# Patient Record
Sex: Male | Born: 1940 | Race: White | Hispanic: No | Marital: Married | Smoking: Former smoker
Health system: Southern US, Community
[De-identification: ages and names within clinical notes are randomized; demographics above are authoritative.]

## PROBLEM LIST (undated history)

## (undated) DIAGNOSIS — K219 Gastro-esophageal reflux disease without esophagitis: Secondary | ICD-10-CM

## (undated) DIAGNOSIS — M1990 Unspecified osteoarthritis, unspecified site: Secondary | ICD-10-CM

## (undated) DIAGNOSIS — F32A Depression, unspecified: Secondary | ICD-10-CM

## (undated) DIAGNOSIS — I739 Peripheral vascular disease, unspecified: Secondary | ICD-10-CM

## (undated) DIAGNOSIS — K859 Acute pancreatitis without necrosis or infection, unspecified: Secondary | ICD-10-CM

## (undated) DIAGNOSIS — K589 Irritable bowel syndrome without diarrhea: Secondary | ICD-10-CM

## (undated) DIAGNOSIS — C679 Malignant neoplasm of bladder, unspecified: Secondary | ICD-10-CM

## (undated) DIAGNOSIS — F329 Major depressive disorder, single episode, unspecified: Secondary | ICD-10-CM

## (undated) DIAGNOSIS — T7840XA Allergy, unspecified, initial encounter: Secondary | ICD-10-CM

## (undated) DIAGNOSIS — G4733 Obstructive sleep apnea (adult) (pediatric): Secondary | ICD-10-CM

## (undated) DIAGNOSIS — E785 Hyperlipidemia, unspecified: Secondary | ICD-10-CM

## (undated) DIAGNOSIS — G4730 Sleep apnea, unspecified: Secondary | ICD-10-CM

## (undated) DIAGNOSIS — Z5189 Encounter for other specified aftercare: Secondary | ICD-10-CM

## (undated) DIAGNOSIS — S83249A Other tear of medial meniscus, current injury, unspecified knee, initial encounter: Secondary | ICD-10-CM

## (undated) DIAGNOSIS — C61 Malignant neoplasm of prostate: Secondary | ICD-10-CM

## (undated) DIAGNOSIS — K5790 Diverticulosis of intestine, part unspecified, without perforation or abscess without bleeding: Secondary | ICD-10-CM

## (undated) DIAGNOSIS — R7303 Prediabetes: Secondary | ICD-10-CM

## (undated) DIAGNOSIS — I10 Essential (primary) hypertension: Secondary | ICD-10-CM

## (undated) DIAGNOSIS — Z8739 Personal history of other diseases of the musculoskeletal system and connective tissue: Secondary | ICD-10-CM

## (undated) DIAGNOSIS — I2510 Atherosclerotic heart disease of native coronary artery without angina pectoris: Secondary | ICD-10-CM

## (undated) DIAGNOSIS — D649 Anemia, unspecified: Secondary | ICD-10-CM

## (undated) DIAGNOSIS — E669 Obesity, unspecified: Secondary | ICD-10-CM

## (undated) HISTORY — DX: Essential (primary) hypertension: I10

## (undated) HISTORY — DX: Irritable bowel syndrome, unspecified: K58.9

## (undated) HISTORY — DX: Major depressive disorder, single episode, unspecified: F32.9

## (undated) HISTORY — DX: Obstructive sleep apnea (adult) (pediatric): G47.33

## (undated) HISTORY — PX: SPINE SURGERY: SHX786

## (undated) HISTORY — DX: Acute pancreatitis without necrosis or infection, unspecified: K85.90

## (undated) HISTORY — DX: Anemia, unspecified: D64.9

## (undated) HISTORY — DX: Allergy, unspecified, initial encounter: T78.40XA

## (undated) HISTORY — PX: BACK SURGERY: SHX140

## (undated) HISTORY — DX: Sleep apnea, unspecified: G47.30

## (undated) HISTORY — PX: KNEE ARTHROSCOPY: SHX127

## (undated) HISTORY — DX: Gastro-esophageal reflux disease without esophagitis: K21.9

## (undated) HISTORY — DX: Malignant neoplasm of prostate: C61

## (undated) HISTORY — DX: Atherosclerotic heart disease of native coronary artery without angina pectoris: I25.10

## (undated) HISTORY — PX: HERNIA REPAIR: SHX51

## (undated) HISTORY — DX: Other tear of medial meniscus, current injury, unspecified knee, initial encounter: S83.249A

## (undated) HISTORY — PX: JOINT REPLACEMENT: SHX530

## (undated) HISTORY — DX: Obesity, unspecified: E66.9

## (undated) HISTORY — DX: Depression, unspecified: F32.A

## (undated) HISTORY — DX: Encounter for other specified aftercare: Z51.89

## (undated) HISTORY — DX: Diverticulosis of intestine, part unspecified, without perforation or abscess without bleeding: K57.90

## (undated) HISTORY — DX: Unspecified osteoarthritis, unspecified site: M19.90

## (undated) HISTORY — DX: Hyperlipidemia, unspecified: E78.5

---

## 1978-03-23 HISTORY — PX: THYROIDECTOMY, PARTIAL: SHX18

## 1999-03-24 DIAGNOSIS — Z9289 Personal history of other medical treatment: Secondary | ICD-10-CM | POA: Insufficient documentation

## 1999-11-20 ENCOUNTER — Inpatient Hospital Stay (HOSPITAL_COMMUNITY): Admission: AD | Admit: 1999-11-20 | Discharge: 1999-11-27 | Payer: Self-pay | Admitting: Cardiovascular Disease

## 1999-11-20 ENCOUNTER — Encounter: Payer: Self-pay | Admitting: Cardiovascular Disease

## 1999-11-20 ENCOUNTER — Ambulatory Visit (HOSPITAL_COMMUNITY): Admission: RE | Admit: 1999-11-20 | Discharge: 1999-11-20 | Payer: Self-pay | Admitting: Cardiovascular Disease

## 1999-11-20 HISTORY — PX: CORONARY ARTERY BYPASS GRAFT: SHX141

## 1999-11-20 HISTORY — PX: CARDIAC CATHETERIZATION: SHX172

## 1999-11-21 ENCOUNTER — Encounter: Payer: Self-pay | Admitting: Thoracic Surgery (Cardiothoracic Vascular Surgery)

## 1999-11-22 ENCOUNTER — Encounter: Payer: Self-pay | Admitting: Thoracic Surgery (Cardiothoracic Vascular Surgery)

## 1999-11-23 ENCOUNTER — Encounter: Payer: Self-pay | Admitting: Thoracic Surgery (Cardiothoracic Vascular Surgery)

## 1999-12-09 ENCOUNTER — Encounter (HOSPITAL_COMMUNITY): Admission: RE | Admit: 1999-12-09 | Discharge: 1999-12-30 | Payer: Self-pay | Admitting: Cardiovascular Disease

## 1999-12-31 ENCOUNTER — Encounter (HOSPITAL_COMMUNITY): Admission: RE | Admit: 1999-12-31 | Discharge: 2000-03-30 | Payer: Self-pay | Admitting: Cardiovascular Disease

## 2003-12-01 ENCOUNTER — Ambulatory Visit (HOSPITAL_COMMUNITY): Admission: RE | Admit: 2003-12-01 | Discharge: 2003-12-01 | Payer: Self-pay | Admitting: Family Medicine

## 2004-02-15 ENCOUNTER — Ambulatory Visit: Payer: Self-pay | Admitting: Internal Medicine

## 2004-04-09 ENCOUNTER — Ambulatory Visit: Payer: Self-pay | Admitting: Internal Medicine

## 2004-05-06 ENCOUNTER — Ambulatory Visit: Payer: Self-pay | Admitting: Internal Medicine

## 2004-07-02 ENCOUNTER — Ambulatory Visit: Payer: Self-pay | Admitting: Internal Medicine

## 2004-07-29 ENCOUNTER — Ambulatory Visit: Payer: Self-pay | Admitting: Cardiology

## 2004-07-30 ENCOUNTER — Ambulatory Visit: Payer: Self-pay | Admitting: Internal Medicine

## 2004-08-05 ENCOUNTER — Ambulatory Visit: Payer: Self-pay

## 2004-08-05 ENCOUNTER — Encounter: Payer: Self-pay | Admitting: Internal Medicine

## 2004-10-08 ENCOUNTER — Ambulatory Visit: Payer: Self-pay | Admitting: Internal Medicine

## 2004-10-31 ENCOUNTER — Ambulatory Visit: Payer: Self-pay | Admitting: Internal Medicine

## 2004-12-11 ENCOUNTER — Ambulatory Visit: Payer: Self-pay | Admitting: Cardiology

## 2004-12-12 ENCOUNTER — Ambulatory Visit: Payer: Self-pay | Admitting: Internal Medicine

## 2005-01-01 ENCOUNTER — Ambulatory Visit: Payer: Self-pay | Admitting: Internal Medicine

## 2005-03-04 ENCOUNTER — Ambulatory Visit: Payer: Self-pay | Admitting: Internal Medicine

## 2005-03-23 HISTORY — PX: NASAL SEPTUM SURGERY: SHX37

## 2005-03-23 HISTORY — PX: LAPAROSCOPIC CHOLECYSTECTOMY: SUR755

## 2005-05-09 ENCOUNTER — Ambulatory Visit: Payer: Self-pay | Admitting: Family Medicine

## 2005-05-27 ENCOUNTER — Ambulatory Visit: Payer: Self-pay | Admitting: Internal Medicine

## 2005-08-05 ENCOUNTER — Ambulatory Visit: Payer: Self-pay | Admitting: Internal Medicine

## 2005-10-08 ENCOUNTER — Ambulatory Visit: Payer: Self-pay | Admitting: Internal Medicine

## 2005-11-19 ENCOUNTER — Encounter (INDEPENDENT_AMBULATORY_CARE_PROVIDER_SITE_OTHER): Payer: Self-pay | Admitting: *Deleted

## 2005-11-19 ENCOUNTER — Ambulatory Visit (HOSPITAL_COMMUNITY): Admission: RE | Admit: 2005-11-19 | Discharge: 2005-11-19 | Payer: Self-pay | Admitting: Otolaryngology

## 2005-11-24 ENCOUNTER — Encounter: Admission: RE | Admit: 2005-11-24 | Discharge: 2005-11-24 | Payer: Self-pay | Admitting: Internal Medicine

## 2005-11-26 ENCOUNTER — Ambulatory Visit: Payer: Self-pay | Admitting: Internal Medicine

## 2005-11-26 ENCOUNTER — Encounter: Payer: Self-pay | Admitting: Internal Medicine

## 2005-12-17 ENCOUNTER — Ambulatory Visit: Payer: Self-pay | Admitting: Internal Medicine

## 2006-01-20 ENCOUNTER — Ambulatory Visit: Payer: Self-pay | Admitting: Internal Medicine

## 2006-02-10 ENCOUNTER — Ambulatory Visit: Payer: Self-pay | Admitting: Cardiology

## 2006-04-22 ENCOUNTER — Ambulatory Visit: Payer: Self-pay | Admitting: Internal Medicine

## 2006-04-24 ENCOUNTER — Encounter: Admission: RE | Admit: 2006-04-24 | Discharge: 2006-04-24 | Payer: Self-pay | Admitting: Internal Medicine

## 2006-04-27 ENCOUNTER — Emergency Department (HOSPITAL_COMMUNITY): Admission: EM | Admit: 2006-04-27 | Discharge: 2006-04-27 | Payer: Self-pay | Admitting: Emergency Medicine

## 2006-04-28 ENCOUNTER — Ambulatory Visit: Payer: Self-pay | Admitting: Internal Medicine

## 2006-04-29 ENCOUNTER — Ambulatory Visit: Payer: Self-pay | Admitting: Gastroenterology

## 2006-05-26 ENCOUNTER — Inpatient Hospital Stay (HOSPITAL_COMMUNITY): Admission: RE | Admit: 2006-05-26 | Discharge: 2006-05-28 | Payer: Self-pay | Admitting: Neurosurgery

## 2006-05-26 HISTORY — PX: ANTERIOR CERVICAL DECOMP/DISCECTOMY FUSION: SHX1161

## 2006-06-15 ENCOUNTER — Ambulatory Visit: Payer: Self-pay | Admitting: Internal Medicine

## 2006-07-29 ENCOUNTER — Encounter (INDEPENDENT_AMBULATORY_CARE_PROVIDER_SITE_OTHER): Payer: Self-pay | Admitting: Specialist

## 2006-07-29 ENCOUNTER — Ambulatory Visit (HOSPITAL_COMMUNITY): Admission: RE | Admit: 2006-07-29 | Discharge: 2006-07-29 | Payer: Self-pay | Admitting: Surgery

## 2006-09-23 ENCOUNTER — Ambulatory Visit: Payer: Self-pay | Admitting: Internal Medicine

## 2006-09-29 ENCOUNTER — Ambulatory Visit: Payer: Self-pay | Admitting: Internal Medicine

## 2006-10-12 ENCOUNTER — Telehealth: Payer: Self-pay | Admitting: *Deleted

## 2006-10-12 DIAGNOSIS — K589 Irritable bowel syndrome without diarrhea: Secondary | ICD-10-CM | POA: Insufficient documentation

## 2006-10-12 DIAGNOSIS — F324 Major depressive disorder, single episode, in partial remission: Secondary | ICD-10-CM | POA: Insufficient documentation

## 2006-10-12 DIAGNOSIS — E785 Hyperlipidemia, unspecified: Secondary | ICD-10-CM | POA: Insufficient documentation

## 2006-10-12 DIAGNOSIS — I2510 Atherosclerotic heart disease of native coronary artery without angina pectoris: Secondary | ICD-10-CM | POA: Insufficient documentation

## 2006-10-12 DIAGNOSIS — J45909 Unspecified asthma, uncomplicated: Secondary | ICD-10-CM | POA: Insufficient documentation

## 2006-10-12 DIAGNOSIS — K219 Gastro-esophageal reflux disease without esophagitis: Secondary | ICD-10-CM | POA: Insufficient documentation

## 2006-10-12 DIAGNOSIS — F325 Major depressive disorder, single episode, in full remission: Secondary | ICD-10-CM | POA: Insufficient documentation

## 2006-10-13 ENCOUNTER — Ambulatory Visit: Payer: Self-pay | Admitting: Internal Medicine

## 2006-10-25 ENCOUNTER — Telehealth: Payer: Self-pay | Admitting: Internal Medicine

## 2006-11-02 ENCOUNTER — Ambulatory Visit: Payer: Self-pay | Admitting: Internal Medicine

## 2006-11-02 DIAGNOSIS — G4733 Obstructive sleep apnea (adult) (pediatric): Secondary | ICD-10-CM | POA: Insufficient documentation

## 2006-11-02 DIAGNOSIS — M1990 Unspecified osteoarthritis, unspecified site: Secondary | ICD-10-CM | POA: Insufficient documentation

## 2006-11-04 ENCOUNTER — Telehealth (INDEPENDENT_AMBULATORY_CARE_PROVIDER_SITE_OTHER): Payer: Self-pay | Admitting: *Deleted

## 2006-11-11 ENCOUNTER — Telehealth (INDEPENDENT_AMBULATORY_CARE_PROVIDER_SITE_OTHER): Payer: Self-pay | Admitting: *Deleted

## 2006-11-11 ENCOUNTER — Telehealth: Payer: Self-pay | Admitting: *Deleted

## 2006-12-14 ENCOUNTER — Ambulatory Visit: Payer: Self-pay | Admitting: Internal Medicine

## 2006-12-14 DIAGNOSIS — R413 Other amnesia: Secondary | ICD-10-CM | POA: Insufficient documentation

## 2006-12-15 ENCOUNTER — Telehealth (INDEPENDENT_AMBULATORY_CARE_PROVIDER_SITE_OTHER): Payer: Self-pay | Admitting: *Deleted

## 2007-02-08 ENCOUNTER — Telehealth: Payer: Self-pay | Admitting: *Deleted

## 2007-02-21 ENCOUNTER — Ambulatory Visit: Payer: Self-pay | Admitting: Internal Medicine

## 2007-02-21 DIAGNOSIS — I10 Essential (primary) hypertension: Secondary | ICD-10-CM | POA: Insufficient documentation

## 2007-02-28 ENCOUNTER — Telehealth (INDEPENDENT_AMBULATORY_CARE_PROVIDER_SITE_OTHER): Payer: Self-pay | Admitting: *Deleted

## 2007-03-23 ENCOUNTER — Telehealth: Payer: Self-pay | Admitting: *Deleted

## 2007-03-26 ENCOUNTER — Ambulatory Visit: Payer: Self-pay | Admitting: Family Medicine

## 2007-03-28 ENCOUNTER — Telehealth: Payer: Self-pay | Admitting: Internal Medicine

## 2007-04-28 ENCOUNTER — Encounter: Payer: Self-pay | Admitting: Internal Medicine

## 2007-04-28 LAB — CONVERTED CEMR LAB
ALT: 27 units/L
AST: 24 units/L
Alkaline Phosphatase: 72 units/L
BUN: 18 mg/dL
Bilirubin, Direct: 0.2 mg/dL
CO2: 23 meq/L
Cholesterol: 144 mg/dL
Creatinine, Ser: 1.44 mg/dL
HDL: 43 mg/dL
LDL Cholesterol: 74 mg/dL
Potassium: 4.5 meq/L
Sodium: 140 meq/L
Total Bilirubin: 0.8 mg/dL
Total Protein: 7 g/dL
Triglycerides: 137 mg/dL

## 2007-05-05 ENCOUNTER — Ambulatory Visit: Payer: Self-pay | Admitting: Internal Medicine

## 2007-05-05 DIAGNOSIS — H9319 Tinnitus, unspecified ear: Secondary | ICD-10-CM | POA: Insufficient documentation

## 2007-05-05 DIAGNOSIS — IMO0002 Reserved for concepts with insufficient information to code with codable children: Secondary | ICD-10-CM | POA: Insufficient documentation

## 2007-05-13 ENCOUNTER — Telehealth: Payer: Self-pay | Admitting: Internal Medicine

## 2007-05-16 ENCOUNTER — Encounter: Payer: Self-pay | Admitting: Internal Medicine

## 2007-06-18 ENCOUNTER — Encounter: Payer: Self-pay | Admitting: Internal Medicine

## 2007-06-21 ENCOUNTER — Telehealth (INDEPENDENT_AMBULATORY_CARE_PROVIDER_SITE_OTHER): Payer: Self-pay | Admitting: *Deleted

## 2007-06-28 ENCOUNTER — Ambulatory Visit: Payer: Self-pay | Admitting: Internal Medicine

## 2007-08-02 ENCOUNTER — Telehealth: Payer: Self-pay | Admitting: Internal Medicine

## 2007-08-11 ENCOUNTER — Telehealth: Payer: Self-pay | Admitting: Internal Medicine

## 2007-08-19 ENCOUNTER — Telehealth: Payer: Self-pay | Admitting: Internal Medicine

## 2007-08-25 ENCOUNTER — Ambulatory Visit: Payer: Self-pay | Admitting: Internal Medicine

## 2007-08-25 DIAGNOSIS — M7700 Medial epicondylitis, unspecified elbow: Secondary | ICD-10-CM | POA: Insufficient documentation

## 2007-09-19 ENCOUNTER — Telehealth: Payer: Self-pay | Admitting: Internal Medicine

## 2007-10-24 ENCOUNTER — Telehealth: Payer: Self-pay | Admitting: Internal Medicine

## 2007-12-08 ENCOUNTER — Telehealth: Payer: Self-pay | Admitting: Internal Medicine

## 2007-12-08 ENCOUNTER — Ambulatory Visit: Payer: Self-pay | Admitting: Internal Medicine

## 2007-12-29 ENCOUNTER — Telehealth: Payer: Self-pay | Admitting: Internal Medicine

## 2007-12-29 ENCOUNTER — Encounter (INDEPENDENT_AMBULATORY_CARE_PROVIDER_SITE_OTHER): Payer: Self-pay | Admitting: *Deleted

## 2008-01-05 ENCOUNTER — Ambulatory Visit: Payer: Self-pay | Admitting: Internal Medicine

## 2008-01-10 ENCOUNTER — Telehealth: Payer: Self-pay | Admitting: Internal Medicine

## 2008-01-27 ENCOUNTER — Telehealth (INDEPENDENT_AMBULATORY_CARE_PROVIDER_SITE_OTHER): Payer: Self-pay | Admitting: *Deleted

## 2008-01-31 ENCOUNTER — Ambulatory Visit: Payer: Self-pay | Admitting: Cardiology

## 2008-02-01 ENCOUNTER — Encounter: Payer: Self-pay | Admitting: Internal Medicine

## 2008-02-01 ENCOUNTER — Ambulatory Visit (HOSPITAL_BASED_OUTPATIENT_CLINIC_OR_DEPARTMENT_OTHER): Admission: RE | Admit: 2008-02-01 | Discharge: 2008-02-01 | Payer: Self-pay | Admitting: Neurosurgery

## 2008-02-14 ENCOUNTER — Ambulatory Visit: Payer: Self-pay | Admitting: Pulmonary Disease

## 2008-02-15 ENCOUNTER — Encounter: Payer: Self-pay | Admitting: Internal Medicine

## 2008-02-23 ENCOUNTER — Ambulatory Visit: Payer: Self-pay | Admitting: Internal Medicine

## 2008-03-14 ENCOUNTER — Telehealth: Payer: Self-pay | Admitting: Internal Medicine

## 2008-03-20 ENCOUNTER — Encounter: Payer: Self-pay | Admitting: Internal Medicine

## 2008-04-05 ENCOUNTER — Ambulatory Visit: Payer: Self-pay | Admitting: Internal Medicine

## 2008-04-09 ENCOUNTER — Encounter: Payer: Self-pay | Admitting: Internal Medicine

## 2008-05-09 ENCOUNTER — Encounter: Payer: Self-pay | Admitting: Internal Medicine

## 2008-05-25 ENCOUNTER — Encounter: Payer: Self-pay | Admitting: Internal Medicine

## 2008-06-08 ENCOUNTER — Ambulatory Visit: Payer: Self-pay | Admitting: Internal Medicine

## 2008-07-04 ENCOUNTER — Encounter: Payer: Self-pay | Admitting: Internal Medicine

## 2008-08-08 ENCOUNTER — Encounter: Payer: Self-pay | Admitting: Internal Medicine

## 2008-09-03 ENCOUNTER — Telehealth: Payer: Self-pay | Admitting: Internal Medicine

## 2008-09-05 ENCOUNTER — Telehealth: Payer: Self-pay | Admitting: Internal Medicine

## 2008-09-06 ENCOUNTER — Encounter: Payer: Self-pay | Admitting: Internal Medicine

## 2008-09-14 ENCOUNTER — Ambulatory Visit: Payer: Self-pay | Admitting: Internal Medicine

## 2008-09-14 DIAGNOSIS — R1013 Epigastric pain: Secondary | ICD-10-CM | POA: Insufficient documentation

## 2008-09-18 ENCOUNTER — Ambulatory Visit: Payer: Self-pay | Admitting: Internal Medicine

## 2008-09-20 ENCOUNTER — Telehealth: Payer: Self-pay | Admitting: Internal Medicine

## 2008-09-28 ENCOUNTER — Telehealth: Payer: Self-pay | Admitting: *Deleted

## 2008-10-10 ENCOUNTER — Telehealth: Payer: Self-pay | Admitting: Internal Medicine

## 2008-10-10 ENCOUNTER — Ambulatory Visit: Payer: Self-pay | Admitting: Internal Medicine

## 2008-10-10 DIAGNOSIS — M79609 Pain in unspecified limb: Secondary | ICD-10-CM | POA: Insufficient documentation

## 2008-10-12 ENCOUNTER — Ambulatory Visit: Payer: Self-pay | Admitting: Internal Medicine

## 2008-10-12 LAB — CONVERTED CEMR LAB
BUN: 13 mg/dL (ref 6–23)
CO2: 31 meq/L (ref 19–32)
Calcium: 9.2 mg/dL (ref 8.4–10.5)
Chloride: 102 meq/L (ref 96–112)
Cholesterol: 118 mg/dL (ref 0–200)
Creatinine, Ser: 1.5 mg/dL (ref 0.4–1.5)
GFR calc non Af Amer: 49.82 mL/min (ref >60)
Glucose, Bld: 96 mg/dL (ref 70–99)
HDL: 39.8 mg/dL (ref >39.00)
LDL Cholesterol: 56 mg/dL (ref 0–99)
Potassium: 4.2 meq/L (ref 3.5–5.1)
Sodium: 137 meq/L (ref 135–145)
Total CHOL/HDL Ratio: 3
Triglycerides: 113 mg/dL (ref 0.0–149.0)
VLDL: 22.6 mg/dL (ref 0.0–40.0)

## 2008-10-18 ENCOUNTER — Ambulatory Visit: Payer: Self-pay | Admitting: Internal Medicine

## 2008-10-29 ENCOUNTER — Telehealth (INDEPENDENT_AMBULATORY_CARE_PROVIDER_SITE_OTHER): Payer: Self-pay | Admitting: *Deleted

## 2008-11-06 ENCOUNTER — Ambulatory Visit: Payer: Self-pay | Admitting: Gastroenterology

## 2008-11-06 DIAGNOSIS — D509 Iron deficiency anemia, unspecified: Secondary | ICD-10-CM | POA: Insufficient documentation

## 2008-11-06 LAB — CONVERTED CEMR LAB
ALT: 22 units/L (ref 0–53)
AST: 23 units/L (ref 0–37)
Albumin: 3.6 g/dL (ref 3.5–5.2)
Alkaline Phosphatase: 65 units/L (ref 39–117)
BUN: 19 mg/dL (ref 6–23)
Basophils Relative: 0.5 % (ref 0.0–3.0)
Bilirubin, Direct: 0.2 mg/dL (ref 0.0–0.3)
CO2: 31 meq/L (ref 19–32)
Calcium: 9 mg/dL (ref 8.4–10.5)
Chloride: 103 meq/L (ref 96–112)
Creatinine, Ser: 1.4 mg/dL (ref 0.4–1.5)
Eosinophils Relative: 5.1 % — ABNORMAL HIGH (ref 0.0–5.0)
Ferritin: 27.8 ng/mL (ref 22.0–322.0)
Folate: >20 ng/mL
GFR calc non Af Amer: 53.93 mL/min (ref >60)
Glucose, Bld: 104 mg/dL — ABNORMAL HIGH (ref 70–99)
HCT: 39.6 % (ref 39.0–52.0)
Hemoglobin: 13.8 g/dL (ref 13.0–17.0)
Iron: 54 ug/dL (ref 42–165)
Lymphocytes Relative: 10.2 % — ABNORMAL LOW (ref 12.0–46.0)
MCHC: 34.8 g/dL (ref 30.0–36.0)
MCV: 88.4 fL (ref 78.0–100.0)
Monocytes Relative: 9.3 % (ref 3.0–12.0)
Neutrophils Relative %: 74.9 % (ref 43.0–77.0)
Platelets: 206 10*3/uL (ref 150.0–400.0)
Potassium: 4.3 meq/L (ref 3.5–5.1)
RBC: 4.48 M/uL (ref 4.22–5.81)
RDW: 12 % (ref 11.5–14.6)
Saturation Ratios: 15.7 % — ABNORMAL LOW (ref 20.0–50.0)
Sodium: 137 meq/L (ref 135–145)
TSH: 1.14 microintl units/mL (ref 0.35–5.50)
Total Bilirubin: 0.9 mg/dL (ref 0.3–1.2)
Total Protein: 6.5 g/dL (ref 6.0–8.3)
Transferrin: 246.1 mg/dL (ref 212.0–360.0)
Vitamin B-12: >1500 pg/mL — ABNORMAL HIGH (ref 211–911)
WBC: 9.4 10*3/uL (ref 4.5–10.5)

## 2008-11-07 ENCOUNTER — Encounter: Payer: Self-pay | Admitting: Gastroenterology

## 2008-11-07 LAB — CONVERTED CEMR LAB: Tissue Transglutaminase Ab, IgA: 0.2 units (ref <7)

## 2008-11-14 ENCOUNTER — Encounter: Payer: Self-pay | Admitting: Internal Medicine

## 2008-11-20 ENCOUNTER — Telehealth: Payer: Self-pay | Admitting: Internal Medicine

## 2008-11-27 ENCOUNTER — Ambulatory Visit: Payer: Self-pay | Admitting: Gastroenterology

## 2008-12-05 ENCOUNTER — Telehealth: Payer: Self-pay | Admitting: Internal Medicine

## 2008-12-12 ENCOUNTER — Encounter: Payer: Self-pay | Admitting: Gastroenterology

## 2008-12-12 ENCOUNTER — Ambulatory Visit: Payer: Self-pay | Admitting: Gastroenterology

## 2008-12-12 LAB — HM COLONOSCOPY

## 2008-12-13 ENCOUNTER — Telehealth: Payer: Self-pay | Admitting: Internal Medicine

## 2008-12-13 ENCOUNTER — Encounter: Payer: Self-pay | Admitting: Gastroenterology

## 2008-12-18 ENCOUNTER — Telehealth: Payer: Self-pay | Admitting: Gastroenterology

## 2008-12-19 ENCOUNTER — Telehealth: Payer: Self-pay | Admitting: Internal Medicine

## 2008-12-26 ENCOUNTER — Ambulatory Visit: Payer: Self-pay | Admitting: Internal Medicine

## 2008-12-26 DIAGNOSIS — M25559 Pain in unspecified hip: Secondary | ICD-10-CM | POA: Insufficient documentation

## 2008-12-26 DIAGNOSIS — M169 Osteoarthritis of hip, unspecified: Secondary | ICD-10-CM | POA: Insufficient documentation

## 2008-12-26 DIAGNOSIS — M1610 Unilateral primary osteoarthritis, unspecified hip: Secondary | ICD-10-CM | POA: Insufficient documentation

## 2008-12-26 DIAGNOSIS — Z87891 Personal history of nicotine dependence: Secondary | ICD-10-CM | POA: Insufficient documentation

## 2009-01-01 ENCOUNTER — Telehealth: Payer: Self-pay | Admitting: Internal Medicine

## 2009-01-30 ENCOUNTER — Telehealth: Payer: Self-pay | Admitting: Gastroenterology

## 2009-01-31 ENCOUNTER — Ambulatory Visit: Payer: Self-pay | Admitting: Gastroenterology

## 2009-01-31 ENCOUNTER — Telehealth: Payer: Self-pay | Admitting: Nurse Practitioner

## 2009-01-31 DIAGNOSIS — R1084 Generalized abdominal pain: Secondary | ICD-10-CM | POA: Insufficient documentation

## 2009-01-31 DIAGNOSIS — K5900 Constipation, unspecified: Secondary | ICD-10-CM | POA: Insufficient documentation

## 2009-02-01 LAB — CONVERTED CEMR LAB
ALT: 25 units/L (ref 0–53)
AST: 22 units/L (ref 0–37)
Albumin: 3.9 g/dL (ref 3.5–5.2)
Alkaline Phosphatase: 64 units/L (ref 39–117)
Amylase: 51 units/L (ref 27–131)
BUN: 18 mg/dL (ref 6–23)
Basophils Absolute: 0.1 10*3/uL (ref 0.0–0.1)
Basophils Relative: 0.6 % (ref 0.0–3.0)
CO2: 30 meq/L (ref 19–32)
CRP, High Sensitivity: 12.5 — ABNORMAL HIGH (ref 0.00–5.00)
Calcium: 9.4 mg/dL (ref 8.4–10.5)
Chloride: 98 meq/L (ref 96–112)
Creatinine, Ser: 1.5 mg/dL (ref 0.4–1.5)
Eosinophils Absolute: 0.4 10*3/uL (ref 0.0–0.7)
Eosinophils Relative: 3.9 % (ref 0.0–5.0)
GFR calc non Af Amer: 49.77 mL/min (ref >60)
Glucose, Bld: 93 mg/dL (ref 70–99)
HCT: 41.3 % (ref 39.0–52.0)
Hemoglobin: 14.3 g/dL (ref 13.0–17.0)
Lipase: 27 units/L (ref 11.0–59.0)
Lymphocytes Relative: 15 % (ref 12.0–46.0)
Lymphs Abs: 1.4 10*3/uL (ref 0.7–4.0)
MCHC: 34.5 g/dL (ref 30.0–36.0)
MCV: 90.6 fL (ref 78.0–100.0)
Monocytes Absolute: 0.9 10*3/uL (ref 0.1–1.0)
Monocytes Relative: 9.5 % (ref 3.0–12.0)
Neutro Abs: 6.4 10*3/uL (ref 1.4–7.7)
Neutrophils Relative %: 71 % (ref 43.0–77.0)
Platelets: 201 10*3/uL (ref 150.0–400.0)
Potassium: 4.5 meq/L (ref 3.5–5.1)
RBC: 4.56 M/uL (ref 4.22–5.81)
RDW: 12.6 % (ref 11.5–14.6)
Sed Rate: 23 mm/hr — ABNORMAL HIGH (ref 0–22)
Sodium: 135 meq/L (ref 135–145)
Total Bilirubin: 1 mg/dL (ref 0.3–1.2)
Total Protein: 7.1 g/dL (ref 6.0–8.3)
WBC: 9.2 10*3/uL (ref 4.5–10.5)

## 2009-02-28 ENCOUNTER — Telehealth: Payer: Self-pay | Admitting: Gastroenterology

## 2009-02-28 ENCOUNTER — Ambulatory Visit: Payer: Self-pay | Admitting: Gastroenterology

## 2009-03-28 ENCOUNTER — Ambulatory Visit: Payer: Self-pay | Admitting: Internal Medicine

## 2009-04-09 ENCOUNTER — Telehealth: Payer: Self-pay | Admitting: Internal Medicine

## 2009-04-12 ENCOUNTER — Telehealth: Payer: Self-pay | Admitting: Internal Medicine

## 2009-04-12 ENCOUNTER — Ambulatory Visit: Payer: Self-pay | Admitting: Internal Medicine

## 2009-04-12 DIAGNOSIS — R0989 Other specified symptoms and signs involving the circulatory and respiratory systems: Secondary | ICD-10-CM | POA: Insufficient documentation

## 2009-04-15 ENCOUNTER — Telehealth: Payer: Self-pay | Admitting: Internal Medicine

## 2009-04-19 ENCOUNTER — Telehealth: Payer: Self-pay | Admitting: Internal Medicine

## 2009-04-22 ENCOUNTER — Ambulatory Visit: Payer: Self-pay | Admitting: Internal Medicine

## 2009-04-22 DIAGNOSIS — J45901 Unspecified asthma with (acute) exacerbation: Secondary | ICD-10-CM | POA: Insufficient documentation

## 2009-04-26 ENCOUNTER — Telehealth: Payer: Self-pay | Admitting: Internal Medicine

## 2009-05-01 ENCOUNTER — Ambulatory Visit: Payer: Self-pay | Admitting: Internal Medicine

## 2009-05-01 DIAGNOSIS — B370 Candidal stomatitis: Secondary | ICD-10-CM | POA: Insufficient documentation

## 2009-05-01 DIAGNOSIS — R0602 Shortness of breath: Secondary | ICD-10-CM | POA: Insufficient documentation

## 2009-05-01 DIAGNOSIS — R079 Chest pain, unspecified: Secondary | ICD-10-CM | POA: Insufficient documentation

## 2009-05-09 ENCOUNTER — Encounter: Payer: Self-pay | Admitting: Physician Assistant

## 2009-05-09 ENCOUNTER — Ambulatory Visit: Payer: Self-pay | Admitting: Cardiology

## 2009-05-13 ENCOUNTER — Telehealth (INDEPENDENT_AMBULATORY_CARE_PROVIDER_SITE_OTHER): Payer: Self-pay | Admitting: *Deleted

## 2009-05-14 ENCOUNTER — Ambulatory Visit: Payer: Self-pay | Admitting: Cardiology

## 2009-05-14 ENCOUNTER — Ambulatory Visit: Payer: Self-pay

## 2009-05-14 ENCOUNTER — Encounter (HOSPITAL_COMMUNITY): Admission: RE | Admit: 2009-05-14 | Discharge: 2009-07-23 | Payer: Self-pay | Admitting: Cardiology

## 2009-06-04 ENCOUNTER — Encounter: Payer: Self-pay | Admitting: Cardiology

## 2009-06-06 ENCOUNTER — Ambulatory Visit: Payer: Self-pay | Admitting: Internal Medicine

## 2009-06-10 ENCOUNTER — Ambulatory Visit: Payer: Self-pay | Admitting: Cardiology

## 2009-06-10 ENCOUNTER — Telehealth: Payer: Self-pay | Admitting: Internal Medicine

## 2009-06-24 ENCOUNTER — Telehealth: Payer: Self-pay | Admitting: Internal Medicine

## 2009-07-04 ENCOUNTER — Telehealth: Payer: Self-pay | Admitting: Internal Medicine

## 2009-07-04 ENCOUNTER — Ambulatory Visit: Payer: Self-pay | Admitting: Internal Medicine

## 2009-07-04 LAB — CONVERTED CEMR LAB
BUN: 20 mg/dL (ref 6–23)
Basophils Absolute: 0 10*3/uL (ref 0.0–0.1)
Basophils Relative: 0.5 % (ref 0.0–3.0)
CO2: 29 meq/L (ref 19–32)
Calcium: 9.7 mg/dL (ref 8.4–10.5)
Chloride: 101 meq/L (ref 96–112)
Cholesterol: 152 mg/dL (ref 0–200)
Creatinine, Ser: 1.4 mg/dL (ref 0.4–1.5)
Direct LDL: 81.1 mg/dL
Eosinophils Absolute: 0.3 10*3/uL (ref 0.0–0.7)
Eosinophils Relative: 3.6 % (ref 0.0–5.0)
GFR calc non Af Amer: 53.83 mL/min (ref >60)
Glucose, Bld: 97 mg/dL (ref 70–99)
HCT: 43.1 % (ref 39.0–52.0)
HDL: 44.2 mg/dL (ref >39.00)
Hemoglobin: 15.1 g/dL (ref 13.0–17.0)
Lymphocytes Relative: 15.9 % (ref 12.0–46.0)
Lymphs Abs: 1.2 10*3/uL (ref 0.7–4.0)
MCHC: 35 g/dL (ref 30.0–36.0)
MCV: 90.4 fL (ref 78.0–100.0)
Monocytes Absolute: 0.8 10*3/uL (ref 0.1–1.0)
Monocytes Relative: 10.5 % (ref 3.0–12.0)
Neutro Abs: 5.2 10*3/uL (ref 1.4–7.7)
Neutrophils Relative %: 69.5 % (ref 43.0–77.0)
Platelets: 244 10*3/uL (ref 150.0–400.0)
Potassium: 4.5 meq/L (ref 3.5–5.1)
RBC: 4.77 M/uL (ref 4.22–5.81)
RDW: 12.7 % (ref 11.5–14.6)
Sodium: 140 meq/L (ref 135–145)
WBC: 7.4 10*3/uL (ref 4.5–10.5)

## 2009-07-09 ENCOUNTER — Ambulatory Visit: Payer: Self-pay | Admitting: Internal Medicine

## 2009-07-09 LAB — CONVERTED CEMR LAB
BUN: 18 mg/dL (ref 6–23)
Basophils Absolute: 0 10*3/uL (ref 0.0–0.1)
Basophils Relative: 0.4 % (ref 0.0–3.0)
CO2: 30 meq/L (ref 19–32)
Calcium: 8.9 mg/dL (ref 8.4–10.5)
Chloride: 101 meq/L (ref 96–112)
Creatinine, Ser: 1.8 mg/dL — ABNORMAL HIGH (ref 0.4–1.5)
Eosinophils Absolute: 0.1 10*3/uL (ref 0.0–0.7)
Eosinophils Relative: 0.9 % (ref 0.0–5.0)
GFR calc non Af Amer: 40.27 mL/min (ref >60)
Glucose, Bld: 105 mg/dL — ABNORMAL HIGH (ref 70–99)
HCT: 45.8 % (ref 39.0–52.0)
Hemoglobin: 15.9 g/dL (ref 13.0–17.0)
Lymphocytes Relative: 11.1 % — ABNORMAL LOW (ref 12.0–46.0)
Lymphs Abs: 0.7 10*3/uL (ref 0.7–4.0)
MCHC: 34.8 g/dL (ref 30.0–36.0)
MCV: 90.3 fL (ref 78.0–100.0)
Monocytes Absolute: 0.6 10*3/uL (ref 0.1–1.0)
Monocytes Relative: 9.7 % (ref 3.0–12.0)
Neutro Abs: 5.2 10*3/uL (ref 1.4–7.7)
Neutrophils Relative %: 77.9 % — ABNORMAL HIGH (ref 43.0–77.0)
Platelets: 214 10*3/uL (ref 150.0–400.0)
Potassium: 3.8 meq/L (ref 3.5–5.1)
RBC: 5.07 M/uL (ref 4.22–5.81)
RDW: 12.5 % (ref 11.5–14.6)
Sodium: 139 meq/L (ref 135–145)
WBC: 6.7 10*3/uL (ref 4.5–10.5)

## 2009-07-15 ENCOUNTER — Ambulatory Visit: Payer: Self-pay | Admitting: Internal Medicine

## 2009-07-15 ENCOUNTER — Telehealth: Payer: Self-pay | Admitting: Internal Medicine

## 2009-07-26 ENCOUNTER — Telehealth: Payer: Self-pay | Admitting: Internal Medicine

## 2009-08-06 ENCOUNTER — Ambulatory Visit: Payer: Self-pay | Admitting: Internal Medicine

## 2009-08-16 ENCOUNTER — Ambulatory Visit: Payer: Self-pay | Admitting: Family Medicine

## 2009-08-28 ENCOUNTER — Ambulatory Visit: Payer: Self-pay | Admitting: Internal Medicine

## 2009-09-25 ENCOUNTER — Encounter: Payer: Self-pay | Admitting: Internal Medicine

## 2009-10-04 ENCOUNTER — Ambulatory Visit: Payer: Self-pay | Admitting: Internal Medicine

## 2009-10-04 DIAGNOSIS — M1710 Unilateral primary osteoarthritis, unspecified knee: Secondary | ICD-10-CM | POA: Insufficient documentation

## 2009-12-04 ENCOUNTER — Ambulatory Visit: Payer: Self-pay | Admitting: Family Medicine

## 2009-12-04 ENCOUNTER — Telehealth: Payer: Self-pay | Admitting: Gastroenterology

## 2009-12-05 ENCOUNTER — Encounter: Payer: Self-pay | Admitting: Family Medicine

## 2009-12-06 ENCOUNTER — Ambulatory Visit: Payer: Self-pay

## 2009-12-06 ENCOUNTER — Encounter: Payer: Self-pay | Admitting: Family Medicine

## 2010-01-06 ENCOUNTER — Ambulatory Visit: Payer: Self-pay | Admitting: Internal Medicine

## 2010-01-06 DIAGNOSIS — J0110 Acute frontal sinusitis, unspecified: Secondary | ICD-10-CM | POA: Insufficient documentation

## 2010-01-07 ENCOUNTER — Telehealth: Payer: Self-pay | Admitting: Internal Medicine

## 2010-02-18 ENCOUNTER — Ambulatory Visit: Payer: Self-pay | Admitting: Gastroenterology

## 2010-02-18 LAB — CONVERTED CEMR LAB
ALT: 26 units/L (ref 0–53)
AST: 28 units/L (ref 0–37)
Albumin: 4.1 g/dL (ref 3.5–5.2)
Alkaline Phosphatase: 60 units/L (ref 39–117)
Amylase: 31 units/L (ref 27–131)
BUN: 20 mg/dL (ref 6–23)
Basophils Absolute: 0.1 10*3/uL (ref 0.0–0.1)
Basophils Relative: 0.7 % (ref 0.0–3.0)
Bilirubin, Direct: 0.1 mg/dL (ref 0.0–0.3)
CO2: 28 meq/L (ref 19–32)
Calcium: 9.3 mg/dL (ref 8.4–10.5)
Chloride: 100 meq/L (ref 96–112)
Creatinine, Ser: 1.5 mg/dL (ref 0.4–1.5)
Eosinophils Absolute: 0.3 10*3/uL (ref 0.0–0.7)
Eosinophils Relative: 4.6 % (ref 0.0–5.0)
Ferritin: 39.7 ng/mL (ref 22.0–322.0)
Folate: >20 ng/mL
GFR calc non Af Amer: 50 mL/min (ref >60)
Glucose, Bld: 93 mg/dL (ref 70–99)
HCT: 42.8 % (ref 39.0–52.0)
Hemoglobin: 14.9 g/dL (ref 13.0–17.0)
IgA: 118 mg/dL (ref 68–378)
Iron: 107 ug/dL (ref 42–165)
Lipase: 34 units/L (ref 11.0–59.0)
Lymphocytes Relative: 18.7 % (ref 12.0–46.0)
Lymphs Abs: 1.3 10*3/uL (ref 0.7–4.0)
MCHC: 34.9 g/dL (ref 30.0–36.0)
MCV: 91.6 fL (ref 78.0–100.0)
Monocytes Absolute: 0.9 10*3/uL (ref 0.1–1.0)
Monocytes Relative: 12.2 % — ABNORMAL HIGH (ref 3.0–12.0)
Neutro Abs: 4.5 10*3/uL (ref 1.4–7.7)
Neutrophils Relative %: 63.8 % (ref 43.0–77.0)
PSA: 1.35 ng/mL (ref 0.10–4.00)
Platelets: 228 10*3/uL (ref 150.0–400.0)
Potassium: 3.8 meq/L (ref 3.5–5.1)
RBC: 4.67 M/uL (ref 4.22–5.81)
RDW: 12.5 % (ref 11.5–14.6)
Saturation Ratios: 24.6 % (ref 20.0–50.0)
Sodium: 137 meq/L (ref 135–145)
TSH: 1.08 microintl units/mL (ref 0.35–5.50)
Tissue Transglutaminase Ab, IgA: 4.8 units (ref <20)
Total Bilirubin: 0.6 mg/dL (ref 0.3–1.2)
Total Protein: 7 g/dL (ref 6.0–8.3)
Transferrin: 311 mg/dL (ref 212.0–360.0)
Vitamin B-12: 998 pg/mL — ABNORMAL HIGH (ref 211–911)
WBC: 7.1 10*3/uL (ref 4.5–10.5)

## 2010-02-19 ENCOUNTER — Telehealth: Payer: Self-pay | Admitting: Gastroenterology

## 2010-02-19 ENCOUNTER — Ambulatory Visit: Payer: Self-pay | Admitting: Internal Medicine

## 2010-02-21 ENCOUNTER — Telehealth: Payer: Self-pay | Admitting: Gastroenterology

## 2010-02-24 ENCOUNTER — Encounter: Payer: Self-pay | Admitting: Internal Medicine

## 2010-02-25 ENCOUNTER — Telehealth: Payer: Self-pay | Admitting: Internal Medicine

## 2010-02-26 ENCOUNTER — Encounter: Payer: Self-pay | Admitting: Internal Medicine

## 2010-03-10 ENCOUNTER — Telehealth: Payer: Self-pay | Admitting: Gastroenterology

## 2010-03-13 ENCOUNTER — Ambulatory Visit (HOSPITAL_COMMUNITY)
Admission: RE | Admit: 2010-03-13 | Discharge: 2010-03-13 | Payer: Self-pay | Source: Home / Self Care | Attending: Gastroenterology | Admitting: Gastroenterology

## 2010-03-26 ENCOUNTER — Encounter: Payer: Self-pay | Admitting: Gastroenterology

## 2010-03-26 ENCOUNTER — Encounter: Payer: Self-pay | Admitting: Internal Medicine

## 2010-03-28 ENCOUNTER — Encounter: Payer: Self-pay | Admitting: Internal Medicine

## 2010-03-28 ENCOUNTER — Telehealth: Payer: Self-pay | Admitting: Internal Medicine

## 2010-03-31 ENCOUNTER — Telehealth: Payer: Self-pay | Admitting: Cardiology

## 2010-04-01 ENCOUNTER — Ambulatory Visit
Admission: RE | Admit: 2010-04-01 | Discharge: 2010-04-01 | Payer: Self-pay | Source: Home / Self Care | Attending: Internal Medicine | Admitting: Internal Medicine

## 2010-04-01 DIAGNOSIS — Z8551 Personal history of malignant neoplasm of bladder: Secondary | ICD-10-CM | POA: Insufficient documentation

## 2010-04-03 ENCOUNTER — Encounter: Payer: Self-pay | Admitting: Cardiology

## 2010-04-03 ENCOUNTER — Ambulatory Visit: Admission: RE | Admit: 2010-04-03 | Discharge: 2010-04-03 | Payer: Self-pay | Source: Home / Self Care

## 2010-04-08 ENCOUNTER — Ambulatory Visit (HOSPITAL_COMMUNITY)
Admission: RE | Admit: 2010-04-08 | Discharge: 2010-04-08 | Payer: Self-pay | Source: Home / Self Care | Attending: Urology | Admitting: Urology

## 2010-04-11 ENCOUNTER — Ambulatory Visit
Admission: RE | Admit: 2010-04-11 | Discharge: 2010-04-11 | Payer: Self-pay | Source: Home / Self Care | Attending: Internal Medicine | Admitting: Internal Medicine

## 2010-04-11 ENCOUNTER — Ambulatory Visit
Admission: RE | Admit: 2010-04-11 | Discharge: 2010-04-11 | Payer: Self-pay | Source: Home / Self Care | Attending: Urology | Admitting: Urology

## 2010-04-11 ENCOUNTER — Encounter: Payer: Self-pay | Admitting: Cardiology

## 2010-04-11 ENCOUNTER — Encounter: Payer: Self-pay | Admitting: Internal Medicine

## 2010-04-22 NOTE — Assessment & Plan Note (Signed)
Summary: 8 WK ROV/NJR/resch with patient/jls   Vital Signs:  Patient Profile:   70 Years Old Male Height:     70 inches Weight:      198 pounds Temp:     98.1 degrees F oral Pulse rate:   76 / minute Resp:     14 per minute BP sitting:   100 / 60  (left arm)  Vitals Entered By: Willy Eddy, LPN (February 23, 2008 9:33 AM)                 Chief Complaint:  roa on levaquin for sinusitis by dr Annalee Genta.  History of Present Illness: Dr Annalee Genta put pt in levoquin for chronic sinusitis and has experienced insomnia... but is feeling better from the infection. Athma is stable HTN is good see form   Hypertension History:      He denies headache, chest pain, palpitations, dyspnea with exertion, orthopnea, PND, peripheral edema, visual symptoms, neurologic problems, syncope, and side effects from treatment.  Further comments include: will decrease the diuretic components.        Positive major cardiovascular risk factors include male age 35 years old or older, hyperlipidemia, and hypertension.  Negative major cardiovascular risk factors include negative family history for ischemic heart disease and non-tobacco-user status.        Positive history for target organ damage include ASHD (either angina; prior MI; prior CABG).       Prior Medication List:  CRESTOR 10 MG TABS (ROSUVASTATIN CALCIUM) Take 1 tablet by mouth q hs BENICAR HCT 20-12.5 MG  TABS (OLMESARTAN MEDOXOMIL-HCTZ) one by mouth daily PULMICORT FLEXHALER 90 MCG/ACT  INHA (BUDESONIDE (INHALATION)) one puff p.o. bid LUNESTA 2 MG  TABS (ESZOPICLONE) one by mouth q hs prn CLARINEX 5 MG  TABS (DESLORATADINE) one by mouth daily BL ADULT ASPIRIN LOW STRENGTH 81 MG  CHEW (ASPIRIN) once daily VITAMIN B-12 100 MCG  TABS (CYANOCOBALAMIN) one p.o. daily over-the-counter NASONEX 50 MCG/ACT  SUSP (MOMETASONE FUROATE) two sprays q nostril daily FOLIC ACID 1 MG  TABS (FOLIC ACID) once daily MIRAPEX 0.125 MG  TABS (PRAMIPEXOLE  DIHYDROCHLORIDE) one at bedtime as needed VALIUM 5 MG  TABS (DIAZEPAM) at bedtime as needed MELOXICAM 15 MG  TABS (MELOXICAM) one by mouth daily PRILOSEC 40 MG  CPDR (OMEPRAZOLE) once daily as needed   Current Allergies (reviewed today): ! VICODIN (HYDROCODONE-ACETAMINOPHEN) ! BROMFENEX ! DOXYCYCLINE HYCLATE (DOXYCYCLINE HYCLATE) ! KETEK (TELITHROMYCIN) ! PROMETHAZINE HCL (PROMETHAZINE HCL) ! PROVIGIL (MODAFINIL) SULFA ERY-TAB (ERYTHROMYCIN BASE) BIAXIN (CLARITHROMYCIN) HYDROCODONE-ACETAMINOPHEN (HYDROCODONE-ACETAMINOPHEN)  Past Medical History:    Reviewed history from 02/21/2007 and no changes required:       Asthma       Coronary artery disease       Depression       GERD       Hyperlipidemia       IBS       medial meniscal tear       degerative disc dz       Hypertension  Past Surgical History:    Reviewed history from 10/12/2006 and no changes required:       Thyroidectomy partial       Coronary artery bypass graft       Cholecystectomy       arthroscopy to knee       Cervical fusion       Cervical laminectomy 05/26/06   Family History:    Reviewed history from 06/28/2007 and no changes  required:       father       Family History of CAD Male 1st degree relative <50       mother       passed a 54  Social History:    Reviewed history from 06/28/2007 and no changes required:       Married       Former Smoker       Retired    Review of Systems  The patient denies anorexia, fever, weight loss, weight gain, vision loss, decreased hearing, hoarseness, chest pain, syncope, dyspnea on exertion, peripheral edema, prolonged cough, headaches, hemoptysis, abdominal pain, melena, hematochezia, severe indigestion/heartburn, hematuria, incontinence, genital sores, muscle weakness, suspicious skin lesions, transient blindness, difficulty walking, depression, unusual weight change, abnormal bleeding, enlarged lymph nodes, angioedema, and breast masses.     Physical  Exam  General:     Well-developed,well-nourished,in no acute distress; alert,appropriate and cooperative throughout examination Head:     atraumatic and male-pattern balding.   Eyes:     pupils equal, pupils round, and pupils reactive to light.   Mouth:     pharynx pink and moist.   Neck:     No deformities, masses, or tenderness noted. Lungs:     Normal respiratory effort, chest expands symmetrically. Lungs are clear to auscultation, no crackles or wheezes. Heart:     Normal rate and regular rhythm. S1 and S2 normal without gallop, murmur, click, rub or other extra sounds. Abdomen:     Bowel sounds positive,abdomen soft and non-tender without masses, organomegaly or hernias noted.    Impression & Recommendations:  Problem # 1:  HYPERTENSION (ICD-401.9) the benicar is not on formulary and the hctz may be contributing to urinary frequency since retiring the blood pressure has significantly improved His updated medication list for this problem includes:    Micardis 40 Mg Tabs (Telmisartan) ..... One by mouth daily  BP today: 100/60 Prior BP: 110/70 (12/08/2007)  Prior 10 Yr Risk Heart Disease: N/A (12/14/2006)  Labs Reviewed: Creat: 1.44 (04/28/2007) Chol: 144 (04/28/2007)   HDL: 43 (04/28/2007)   LDL: 74 (04/28/2007)   TG: 137 (04/28/2007)   Problem # 2:  SYMPTOM, HYPERSOMNIA W/SLEEP APNEA NOS (ICD-780.53) Assessment: Deteriorated PTS SLEEP APNEA IS MILD AND THE PT WANTS TO TRY WEIGHT LOSS,   ( HAD NASAL SURGERY), NASAL STRIP CPAP refused CPAP (cm H20):   Mask Type:   Mask Size:   Problem # 3:  SINUSITIS, CHRONIC NOS (ICD-473.9) followed by Dr Annalee Genta His updated medication list for this problem includes:    Nasonex 50 Mcg/act Susp (Mometasone furoate) .[name]..[name] Two sprays q nostril daily    Levaquin 500 Mg Tabs (Levofloxacin) .[name]... 1 once daily for 2 weeks Take antibiotics for full duration. Discussed treatment options including indications for coronal CT scan of sinuses  and ENT referral.   Complete Medication List: 1)  Crestor 10 Mg Tabs (Rosuvastatin calcium) .... Take 1 tablet by mouth q hs 2)  Micardis 40 Mg Tabs (Telmisartan) .... One by mouth daily 3)  Pulmicort Flexhaler 90 Mcg/act Inha (Budesonide (inhalation)) .... One puff p.o. bid 4)  Lunesta 2 Mg Tabs (Eszopiclone) .... One by mouth q hs prn 5)  Clarinex 5 Mg Tabs (Desloratadine) .... One by mouth daily 6)  Bl Adult Aspirin Low Strength 81 Mg Chew (Aspirin) .... Once daily 7)  Vitamin B-12 100 Mcg Tabs (Cyanocobalamin) .... One p.o. daily over-the-counter 8)  Nasonex 50 Mcg/act Susp (Mometasone furoate) .... Two sprays  q nostril daily 9)  Folic Acid 1 Mg Tabs (Folic acid) .... Once daily 10)  Mirapex 0.125 Mg Tabs (Pramipexole dihydrochloride) .... One at bedtime as needed 11)  Valium 5 Mg Tabs (Diazepam) .... At bedtime as needed 12)  Meloxicam 15 Mg Tabs (Meloxicam) .... One by mouth daily 13)  Prilosec 40 Mg Cpdr (Omeprazole) .... Once daily as needed 14)  Levaquin 500 Mg Tabs (Levofloxacin) .[name].. 1 once daily for 2 weeks  Hypertension Assessment/Plan:      The patient's hypertensive risk group is category C: Target organ damage and/or diabetes.  Today's blood pressure is 100/60.  His blood pressure goal is < 140/90.   Patient Instructions: 1)  Please schedule a follow-up appointment in 6 weeks. 2)  wide large nasal strips and saline followed by astilin (samples given) 3)  and weight loss!!!!!!   Prescriptions: LUNESTA 2 MG  TABS (ESZOPICLONE) one by mouth q hs prn  #30 x 3   Entered by:   Willy Eddy, LPN   Authorized by:   Stacie Glaze MD   Signed by:   Willy Eddy, LPN on 91/47/8295   Method used:   Print then Give to Patient   RxID:   [id number]  ]

## 2010-04-22 NOTE — Assessment & Plan Note (Signed)
Summary: SORETHROAT FOR A WEEK Despina Hick PAIN/TIRED/KDC  Medications Added AMOXICILLIN 500 MG CAP (AMOXICILLIN) Take 2 capsule by mouth two  times a day X 10 days        Vital Signs:  Patient Profile:   70 Years Old Male Height:     70 inches Temp:     97.0 degrees F oral Pulse rate:   80 / minute BP sitting:   133 / 89  (right arm)  Vitals Entered By: Glendell Docker (March 26, 2007 1:18 PM)                 Chief Complaint:  C/O MOUTH IRRITATION SINUS DRAINAGE AND SORE THROAT X 1 WEEK.  Acute Visit History:      The patient complains of fever, sinus problems, and sore throat.  These symptoms began 3 days ago.  He denies earache and eye symptoms.  Other comments include: roof of mouth itches/tender fatigue.        His highest temperature has been subjective.        He complains of sinus pressure.        Current Allergies (reviewed today): ! VICODIN (HYDROCODONE-ACETAMINOPHEN) ! BROMFENEX ! DOXYCYCLINE HYCLATE (DOXYCYCLINE HYCLATE) ! KETEK (TELITHROMYCIN) ! PROMETHAZINE HCL (PROMETHAZINE HCL) ! PROVIGIL (MODAFINIL) SULFA ERY-TAB (ERYTHROMYCIN BASE) BIAXIN (CLARITHROMYCIN) HYDROCODONE-ACETAMINOPHEN (HYDROCODONE-ACETAMINOPHEN)  Past Medical History:    Reviewed history from 02/21/2007 and no changes required:       Asthma       Coronary artery disease       Depression       GERD       Hyperlipidemia       IBS       medial meniscal tear       degerative disc dz       Hypertension    Family History:    Reviewed history and no changes required:    Review of Systems      See HPI   Physical Exam  General:     Well-developed,well-nourished,in no acute distress; alert,appropriate and cooperative throughout examination Head:     l max sinus pain Ears:     External ear exam shows no significant lesions or deformities.  Otoscopic examination reveals clear canals, tympanic membranes are intact bilaterally without bulging, retraction, inflammation or  discharge. Hearing is grossly normal bilaterally. Nose:     mucus Mouth:     erythema oropharynx and roof of mouth Lungs:     Normal respiratory effort, chest expands symmetrically. Lungs are clear to auscultation, no crackles or wheezes. Heart:     Normal rate and regular rhythm. S1 and S2 normal without gallop, murmur, click, rub or other extra sounds. Cervical Nodes:     No lymphadenopathy noted    Impression & Recommendations:  Problem # 1:  SINUSITIS- ACUTE-NOS (ICD-461.9)  His updated medication list for this problem includes:    Nasacort Aq 55 Mcg/act Aers (Triamcinolone acetonide(nasal)) .[name]... 1 spray in each nostril twice a day    Zithromax Z-pak 250 Mg Tabs (Azithromycin) .[name]... Take as directed    Amoxicillin 500 Mg Cap (Amoxicillin) .[name]... Take 2 capsule by mouth two  times a day x 10 days Instructed on treatment. Call if symptoms persist or worsen.   Complete Medication List: 1)  Crestor 10 Mg Tabs (Rosuvastatin calcium) .... Take 1 tablet by mouth q hs 2)  Benicar Hct 20-12.5 Mg Tabs (Olmesartan medoxomil-hctz) .... One by mouth daily 3)  Pulmicort Flexhaler 90 Mcg/act  Inha (Budesonide (inhalation)) .... One puff p.o. bid 4)  Lunesta 2 Mg Tabs (Eszopiclone) .... One by mouth q hs prn 5)  Clarinex 5 Mg Tabs (Desloratadine) .... One by mouth daily 6)  Bl Adult Aspirin Low Strength 81 Mg Chew (Aspirin) .... Once daily 7)  Vitamin B-12 100 Mcg Tabs (Cyanocobalamin) .... One p.o. daily over-the-counter 8)  Nasacort Aq 55 Mcg/act Aers (Triamcinolone acetonide(nasal)) .[name].. 1 spray in each nostril twice a day 9)  Folic Acid 1 Mg Tabs (Folic acid) .... Once daily 10)  Mirapex 0.125 Mg Tabs (Pramipexole dihydrochloride) .... One at bedtime as needed 11)  Valium 5 Mg Tabs (Diazepam) .... At bedtime as needed 12)  Meloxicam 15 Mg Tabs (Meloxicam) .... One by mouth daily 13)  Prilosec 40 Mg Cpdr (Omeprazole) .... Once daily as needed 14)  Zithromax Z-pak 250 Mg Tabs  (Azithromycin) .... Take as directed 15)  Lac-hydrin Five 5 % Lotn (Ammonium lactate) .... Apply to affected area two times a day 16)  Amoxicillin 500 Mg Cap (Amoxicillin) .... Take 2 capsule by mouth two  times a day x 10 days     Prescriptions: AMOXICILLIN 500 MG CAP (AMOXICILLIN) Take 2 capsule by mouth two  times a day X 10 days  #40 x 0   Entered and Authorized by:   Kerby Nora MD   Signed by:   Kerby Nora MD on 03/26/2007   Method used:   Electronically sent to ...       [facility]*       [address]       [place], [place]  [address]       Ph: [phone]       Fax: [phone]   RxID:   [id number]  ]

## 2010-04-22 NOTE — Progress Notes (Signed)
Summary: NEED ADVICE  Medications Added CELEBREX 200 MG CAPS (CELECOXIB)  CRESTOR 10 MG TABS (ROSUVASTATIN CALCIUM) Take 1 tablet by mouth once a day       Phone Note Call from Patient Call back at Work Phone [phone]   Caller: Patient Call For: DR Lennon Alstrom Reason for Call: Talk to Nurse Summary of Call: ON ABX FOR HIS ABSESS UNDER HIS ARM. ON ABX. HAS 2 PILLS LEFT. HAS NOTICED A KNOTTED CORE RUNNING UP HIS LEFT ARM. PLEASE CALL. CONCERNED Initial call taken by: Warnell Forester,  October 12, 2006 8:42 AM  Follow-up for Phone Call        needs to be seen  please add to schedule wednesday 915 Follow-up by: Stacie Glaze MD,  October 12, 2006 12:09 PM    New/Updated Medications: CELEBREX 200 MG CAPS (CELECOXIB)  CRESTOR 10 MG TABS (ROSUVASTATIN CALCIUM) Take 1 tablet by mouth once a day

## 2010-04-22 NOTE — Progress Notes (Signed)
Summary: allergic doxy  Phone Note Call from Patient   Caller: live Call For: Lailany Enoch Reason for Call: Acute Illness Complaint: Cough/Sore throat, Headache, Breathing Problems Summary of Call: Ov requested today for not better at all on the cough med sub for the ATuss.  No fever.  Non-productive cough, feels loose in chest, feels pressure, tight, a weight in chest, "not a heart attack,"  short of breath.   Has not been using the ProAir.  Does use the Qvar.  Talking aggravated his cough & got off phone to use ProAir.  [facility].  QAllergic to mycins & others - incomplete list due to getting off phone. Initial call taken by: Rudy Jew, RN,  April 12, 2009 9:08 AM  Follow-up for Phone Call        sent to [facility] for [facility]r Follow-up by: Willy Eddy, LPN,  April 12, 2009 9:13 AM  Additional Follow-up for Phone Call Additional follow up Details #1::        per dr Lovell Sheehan after seeing cxr--medrol 4 mg dose pack and doxycycline 100 bid for 10 day Allergy list doxy causes N&V Rudy Jew, RN  April 12, 2009 12:18 PM   Additional Follow-up by: Willy Eddy, LPN,  April 12, 2009 11:41 AM  New Problems: PULMONARY CONGESTION (ICD-786.9)   Additional Follow-up for Phone Call Additional follow up Details #2::    per dr Lovell Sheehan- generic ceftin 250 two times a day for 10 days will be the substitute Follow-up by: Willy Eddy, LPN,  April 12, 2009 12:25 PM  New Problems: PULMONARY CONGESTION (ICD-786.9)

## 2010-04-22 NOTE — Letter (Signed)
Summary: [facility]  [facility]   Imported By: Maryln Gottron 04/26/2008 15:50:01  _____________________________________________________________________  External Attachment:    Type:   Image     Comment:   External Document

## 2010-04-22 NOTE — Progress Notes (Signed)
Summary: triage   Phone Note Call from Patient Call back at [phone]   Caller: Patient Call For: Dr Jarold Motto Reason for Call: Talk to Nurse Summary of Call: Patient wants tot be seen today for severe abd pain x 2 weeks and frequent bms. Initial call taken by: Tawni Levy,  December 04, 2009 8:37 AM  Follow-up for Phone Call        Pt. says he has  had epigastic pain for 2 weeks with a pain level of a 5-6.No triggers noted says it is same type of pain he has had in the past. Having 4-5 sm.soft formed stools a day  and usually has one stool a day. Is on Prilosec b.i.d. and has  tried Mylanta and Pepto Bismol but they have not helped.. No openings with N.P today but there are  openings tomorrow. Follow-up by: Teryl Lucy RN,  December 04, 2009 8:51 AM  Additional Follow-up for Phone Call Additional follow up Details #1::        i care ...dr.[name]...he has chronic pain syndrome... Additional Follow-up by: Mardella Layman MD FACG,  December 04, 2009 9:09 AM    Additional Follow-up for Phone Call Additional follow up Details #2::    Pt. will make appt. with Dr.[name]. Follow-up by: Teryl Lucy RN,  December 04, 2009 9:12 AM

## 2010-04-22 NOTE — Assessment & Plan Note (Signed)
Summary: 3 week fup//ccm   Vital Signs:  Patient profile:   70 year old male Height:      67 inches Weight:      196 pounds BMI:     30.81 Temp:     98.2 degrees F oral Pulse rate:   76 / minute Resp:     14 per minute BP sitting:   140 / 78  (left arm)  Vitals Entered By: Willy Eddy, LPN (August 28, 100 4:23 PM) CC: roa bp check, Hypertension Management   Primary Care Provider:  Darryll Capers, MD  CC:  roa bp check and Hypertension Management.  History of Present Illness:  Hypertension Follow-Up      This is a 70 year old man who presents for Hypertension follow-up.  The patient denies lightheadedness, urinary frequency, headaches, edema, impotence, rash, and fatigue.  The patient denies the following associated symptoms: chest pain, chest pressure, exercise intolerance, dyspnea, palpitations, syncope, leg edema, and pedal edema.  Compliance with medications (by patient report) has been near 100%.  The patient reports that dietary compliance has been good.  The patient reports exercising occasionally.    weight control  Hypertension History:      He denies headache, chest pain, palpitations, dyspnea with exertion, orthopnea, PND, peripheral edema, visual symptoms, neurologic problems, syncope, and side effects from treatment.        Positive major cardiovascular risk factors include male age 68 years old or older, hyperlipidemia, and hypertension.  Negative major cardiovascular risk factors include negative family history for ischemic heart disease and non-tobacco-user status.        Positive history for target organ damage include ASHD (either angina/prior MI/prior CABG).     Preventive Screening-Counseling & Management  Alcohol-Tobacco     Smoking Status: never     Year Quit: 1993     Passive Smoke Exposure: no  Problems Prior to Update: 1)  Back Pain  (ICD-724.5) 2)  Gastroenteritis  (ICD-558.9) 3)  Fatigue  (ICD-780.79) 4)  Asthma  (ICD-493.90) 5)  Thrush   (ICD-112.0) 6)  Shortness of Breath (SOB)  (ICD-786.05) 7)  Chest Pain-unspecified  (ICD-786.50) 8)  Asthenia  (ICD-780.79) 9)  Asthma Unspecified With Exacerbation  (ICD-493.92) 10)  Pulmonary Congestion  (ICD-786.9) 11)  Adverse Drug Reaction  (ICD-995.20) 12)  Constipation  (ICD-564.00) 13)  Abdominal Pain -generalized  (ICD-789.07) 14)  Loc Osteoarthros Not Spec Prim/sec Pelv Rgn&thi  (ICD-715.35) 15)  Hip Pain, Right  (ICD-719.45) 16)  Tobacco Abuse, Hx of  (ICD-V15.82) 17)  Anemia, Iron Deficiency  (ICD-280.9) 18)  Heel Pain, Right  (ICD-729.5) 19)  Abdominal Pain, Epigastric  (ICD-789.06) 20)  Medial Epicondylitis, 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) 21)  Preventive Health Care  (ICD-V70.0) 22)  Family History of Cad Male 1st Degree Relative <50  (ICD-V17.3) 23)  Tinnitus, Chronic, 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) 24)  Hypertension  (ICD-401.9) 25)  Symptom, Memory Loss  (ICD-780.93) 26)  Sinusitis, Chronic Nos  (ICD-473.9) 27)  Osteoarthrosis Nos, Unspecified Site  (ICD-715.90) 28)  Symptom, Hypersomnia W/sleep Apnea Nos  (ICD-780.53) 29)  Hx of Medial Meniscus Tear  (ICD-836.0) 30)  Hyperlipidemia  (ICD-272.4) 31)  Gerd  (ICD-530.81) 32)  Depression  (ICD-311) 33)  Coronary Artery Disease  (ICD-414.00) 34)  Asthma  (ICD-493.90) 35)  Irritable Bowel Syndrome  (ICD-564.1)  Current Problems (verified): 1)  Back Pain  (ICD-724.5) 2)  Gastroenteritis  (ICD-558.9) 3)  Fatigue  (ICD-780.79) 4)  Asthma  (ICD-493.90) 5)  Thrush  (ICD-112.0) 6)  Shortness of Breath (SOB)  (ICD-786.05) 7)  Chest Pain-unspecified  (ICD-786.50) 8)  Asthenia  (ICD-780.79) 9)  Asthma Unspecified With Exacerbation  (ICD-493.92) 10)  Pulmonary Congestion  (ICD-786.9) 11)  Adverse Drug Reaction  (ICD-995.20) 12)  Constipation  (ICD-564.00) 13)  Abdominal Pain -generalized  (ICD-789.07) 14)  Loc Osteoarthros Not Spec Prim/sec Pelv Rgn&thi  (ICD-715.35) 15)  Hip Pain, Right  (ICD-719.45) 16)  Tobacco Abuse, Hx of   (ICD-V15.82) 17)  Anemia, Iron Deficiency  (ICD-280.9) 18)  Heel Pain, Right  (ICD-729.5) 19)  Abdominal Pain, Epigastric  (ICD-789.06) 20)  Medial Epicondylitis, 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) 21)  Preventive Health Care  (ICD-V70.0) 22)  Family History of Cad Male 1st Degree Relative <50  (ICD-V17.3) 23)  Tinnitus, Chronic, 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 Take 1 Tablet By Mouth Q Hs 2)  Benicar Hct 20-12.5 Mg Tabs (Olmesartan Medoxomil-Hctz) .[name].. 1 Once Daily 3)  Clarinex 5 Mg  Tabs (Desloratadine) .... One By Mouth Daily 4)  Bl Adult Aspirin Low Strength 81 Mg  Chew (Aspirin) .... Once Daily 5)  Vitamin B-12 100 Mcg  Tabs (Cyanocobalamin) .... One P.o. Daily Over-The-Counter 6)  Flonase 50 Mcg/act Susp (Fluticasone Propionate) .... 2 Spray Each Nostril Once Daily 7)  Folic Acid 1 Mg  Tabs (Folic Acid) .... Once Daily 8)  Mirapex 0.125 Mg  Tabs (Pramipexole Dihydrochloride) .... One At Bedtime As Needed 9)  Valium 5 Mg  Tabs (Diazepam) .... At Bedtime As Needed 10)  Prilosec 20 Mg Cpdr (Omeprazole) .... One By Mouth Two Times A Day With Samples 11)  Lac-Hydrin Five 5 % Lotn (Ammonium Lactate) .... Apply To Affected Area Two Times A Day As Needed 12)  Qvar 80 Mcg/act Aers (Beclomethasone Dipropionate) .... Two Puffs By Mouth Daily 13)  Proair Hfa 108 (90 Base) Mcg/act  Aers (Albuterol Sulfate) .... Wo Pudd By Mouth  Q 6 Hour Prn 14)  Benefiber  Powd (Wheat  Dextrin) .... Mix Powder in Water. 15)  Nitrostat 0.4 Mg Subl (Nitroglycerin) .[name].. 1 Tablet Under Tongue At Onset of Chest Pain; You May Repeat Every 5 Minutes For Up To 3 Doses. 16)  Miralax  Powd (Polyethylene Glycol 3350) .... Daily 17)  Align  Caps (Probiotic Product) .[name].. 1 By Mouth Daily 18)  Tramadol Hcl 50 Mg Tabs (Tramadol Hcl) .... One By Mouth Q 6 Hours As Needed Pain Noted Prior Reaction To Codine 19)  Promethazine Hcl 25 Mg Tabs (Promethazine Hcl) .... One Every 6 Hours As Needed For Nausea  Current Medications (verified): 1)  Crestor 10 Mg Tabs (Rosuvastatin Calcium) .... Take 1 Tablet By Mouth Q Hs 2)  Benicar Hct 20-12.5 Mg Tabs (Olmesartan Medoxomil-Hctz) .[name].. 1 Once Daily 3)  Clarinex 5 Mg  Tabs (Desloratadine) .... One By Mouth Daily 4)  Bl Adult Aspirin Low Strength 81 Mg  Chew (Aspirin) .... Once Daily 5)  Vitamin B-12 100 Mcg  Tabs (Cyanocobalamin) .... One P.o. Daily Over-The-Counter 6)  Flonase 50  Mcg/act Susp (Fluticasone Propionate) .... 2 Spray Each Nostril Once Daily 7)  Folic Acid 1 Mg  Tabs (Folic Acid) .... Once Daily 8)  Mirapex 0.125 Mg  Tabs (Pramipexole Dihydrochloride) .... One At Bedtime As Needed 9)  Valium 5 Mg  Tabs (Diazepam) .... At Bedtime As Needed 10)  Prilosec 20 Mg Cpdr (Omeprazole) .... One By Mouth Two Times A Day With Samples 11)  Lac-Hydrin Five 5 % Lotn (Ammonium Lactate) .... Apply To Affected Area Two Times A Day As Needed 12)  Qvar 80 Mcg/act Aers (Beclomethasone Dipropionate) .... Two Puffs By Mouth Daily 13)  Proair Hfa 108 (90 Base) Mcg/act  Aers (Albuterol Sulfate) .... Wo Pudd By Mouth  Q 6 Hour Prn 14)  Benefiber  Powd (Wheat Dextrin) .... Mix Powder in Water. 15)  Nitrostat 0.4 Mg Subl (Nitroglycerin) .[name].. 1 Tablet Under Tongue At Onset of Chest Pain; You May Repeat Every 5 Minutes For Up To 3 Doses. 16)  Miralax  Powd (Polyethylene Glycol 3350) .... Daily 17)  Align  Caps (Probiotic Product) .[name].. 1 By Mouth Daily 18)  Tramadol  Hcl 50 Mg Tabs (Tramadol Hcl) .... One By Mouth Q 6 Hours As Needed Pain Noted Prior Reaction To Codine  Allergies (verified): 1)  ! Vicodin (Hydrocodone-Acetaminophen) 2)  ! Bromfenex 3)  ! Doxycycline Hyclate (Doxycycline Hyclate) 4)  ! Ketek (Telithromycin) 5)  ! Promethazine Hcl (Promethazine Hcl) 6)  ! Provigil (Modafinil) 7)  ! Codeine 8)  Sulfa 9)  Ery-Tab (Erythromycin Base) 10)  Biaxin (Clarithromycin) 11)  Hydrocodone-Acetaminophen (Hydrocodone-Acetaminophen)  Past History:  Family History: Last updated: 02/28/2009 father Family History of CAD Male 1st degree relative <50 mother passed a 75 No FH of Colon Cancer:  Social History: Last updated: 05/01/2009 Married Former Smoker, quit in 1994 x 35 years avg. >1 ppd Retired Alcohol Use - yes 2 per week Daily Caffeine Use Illicit Drug Use - no Exercise regular  Risk Factors: Caffeine Use: 5+ (02/21/2007) Exercise: no (02/21/2007)  Risk Factors: Smoking Status: never (08/28/2009) Passive Smoke Exposure: no (08/28/2009)  Past medical, surgical, family and social histories (including risk factors) reviewed, and no changes noted (except as noted below). Past medical, surgical, family and social histories (including risk factors) reviewed for relevance to current acute and chronic problems.  Past Medical History: Reviewed history from 06/10/2009 and no changes required. Asthma......Marland KitchenDr Charna Archer > dxed 2002. On QVAR since then. 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 Thyroidectomy partial Coronary  artery bypass graft Cholecystectomy Cervical fusion Cervical laminectomy 05/26/06 Athroscopy right knee 04/2008  daldorf  Family History: Reviewed history from 02/28/2009 and no changes required. father Family History of CAD Male 1st degree relative <50 mother passed a 75 No FH of Colon Cancer:  Social History: Reviewed history from 05/01/2009 and no changes required. Married Former Smoker, quit in 1994 x 35 years avg. >1 ppd Retired Alcohol Use - yes 2 per week Daily Caffeine Use Illicit Drug Use - no Exercise regular  Review of Systems  The patient denies anorexia, fever, weight loss, weight gain, vision loss, decreased hearing, hoarseness, chest pain, syncope, dyspnea on exertion, peripheral edema, prolonged cough, headaches, hemoptysis, abdominal pain, melena, hematochezia,  severe indigestion/heartburn, hematuria, incontinence, genital sores, muscle weakness, suspicious skin lesions, transient blindness, difficulty walking, depression, unusual weight change, abnormal bleeding, enlarged lymph nodes, angioedema, and breast masses.    Physical Exam  General:  Well-developed,well-nourished,in no acute distress; alert,appropriate and cooperative throughout examination Head:  Normocephalic and atraumatic without obvious abnormalities. No apparent alopecia or balding. Eyes:  anicteric Ears:  hearing  aids removed.  Tympanic membranes and canals clear Nose:  mucosal erythema and mucosal edema.   Mouth:  Oral mucosa and oropharynx without lesions or exudates.  Teeth in good repair. appeared well-hydrated Neck:  No deformities, masses, or tenderness noted. Lungs:  Normal respiratory effort, chest expands symmetrically. Lungs are clear to auscultation, no crackles or wheezes. Heart:  normal rate and regular rhythm.   Abdomen:  obese soft and nontender.  No distention, guarding, or rebound.  Bowel sounds normal Msk:  straight leg test was mildly positive on the left with aggravation of  his lumbar pain Pulses:  R and L carotid,radial,femoral,dorsalis pedis and posterior tibial pulses are full and equal bilaterally Extremities:  no edema Neurologic:  foot and toe flexion and extension normal.  Reflexes symmetrical   Impression & Recommendations:  Problem # 1:  HYPERTENSION (ICD-401.9)  His updated medication list for this problem includes:    Benicar Hct 20-12.5 Mg Tabs (Olmesartan medoxomil-hctz) .[name]... 1 once daily  BP today: 140/78 Prior BP: 128/72 (08/16/2009)  Prior 10 Yr Risk Heart Disease: N/A (12/14/2006)  Labs Reviewed: K+: 3.8 (07/09/2009) Creat: : 1.8 (07/09/2009)   Chol: 152 (07/04/2009)   HDL: 44.20 (07/04/2009)   LDL: 56 (10/12/2008)   TG: 113.0 (10/12/2008)  Problem # 2:  FATIGUE (ICD-780.79) portion control and carb limiting  Problem # 3:  ANEMIA, IRON DEFICIENCY (ICD-280.9)  His updated medication list for this problem includes:    Vitamin B-12 100 Mcg Tabs (Cyanocobalamin) ..... One p.o. daily over-the-counter    Folic Acid 1 Mg Tabs (Folic acid) ..... Once daily  Hgb: 15.9 (07/09/2009)   Hct: 45.8 (07/09/2009)   Platelets: 214.0 (07/09/2009) RBC: 5.07 (07/09/2009)   RDW: 12.5 (07/09/2009)   WBC: 6.7 (07/09/2009) MCV: 90.3 (07/09/2009)   MCHC: 34.8 (07/09/2009) Ferritin: 27.8 (11/06/2008) Iron: 54 (11/06/2008)   % Sat: 15.7 (11/06/2008) B12: >1500 pg/mL (11/06/2008)   Folate: >20.0 ng/mL (11/06/2008)   TSH: 1.14 (11/06/2008)  Problem # 4:  HYPERLIPIDEMIA (ICD-272.4) Assessment: Unchanged  samples given His updated medication list for this problem includes:    Crestor 10 Mg Tabs (Rosuvastatin calcium) .[name]... Take 1 tablet by mouth q hs  Labs Reviewed: SGOT: 22 (01/31/2009)   SGPT: 25 (01/31/2009)  Prior 10 Yr Risk Heart Disease: N/A (12/14/2006)   HDL:44.20 (07/04/2009), 39.80 (10/12/2008)  LDL:56 (10/12/2008), 74 (04/28/2007)  Chol:152 (07/04/2009), 118 (10/12/2008)  Trig:113.0 (10/12/2008), 137 (04/28/2007)  Complete Medication  List: 1)  Crestor 10 Mg Tabs (Rosuvastatin calcium) .... Take 1 tablet by mouth q hs 2)  Benicar Hct 20-12.5 Mg Tabs (Olmesartan medoxomil-hctz) .[name].. 1 once daily 3)  Clarinex 5 Mg Tabs (Desloratadine) .... One by mouth daily 4)  Bl Adult Aspirin Low Strength 81 Mg Chew (Aspirin) .... Once daily 5)  Vitamin B-12 100 Mcg Tabs (Cyanocobalamin) .... One p.o. daily over-the-counter 6)  Flonase 50 Mcg/act Susp (Fluticasone propionate) .... 2 spray each nostril once daily 7)  Folic Acid 1 Mg Tabs (Folic acid) .... Once daily 8)  Mirapex 0.125 Mg Tabs (Pramipexole dihydrochloride) .... One at bedtime as needed 9)  Valium 5 Mg Tabs (Diazepam) .Marland KitchenMarland KitchenMarland Kitchen  At bedtime as needed 10)  Prilosec 20 Mg Cpdr (Omeprazole) .... One by mouth two times a day with samples 11)  Lac-hydrin Five 5 % Lotn (Ammonium lactate) .... Apply to affected area two times a day as needed 12)  Qvar 80 Mcg/act Aers (Beclomethasone dipropionate) .... Two puffs by mouth daily 13)  Proair Hfa 108 (90 Base) Mcg/act Aers (Albuterol sulfate) .... Wo pudd by mouth  q 6 hour prn 14)  Benefiber Powd (Wheat dextrin) .... Mix powder in water. 15)  Nitrostat 0.4 Mg Subl (Nitroglycerin) .[name].. 1 tablet under tongue at onset of chest pain; you may repeat every 5 minutes for up to 3 doses. 16)  Miralax Powd (Polyethylene glycol 3350) .... Daily 17)  Align Caps (Probiotic product) .[name].. 1 by mouth daily 18)  Tramadol Hcl 50 Mg Tabs (Tramadol hcl) .... One by mouth q 6 hours as needed pain noted prior reaction to codine  Hypertension Assessment/Plan:      The patient's hypertensive risk group is category C: Target organ damage and/or diabetes.  Today's blood pressure is 140/78.  His blood pressure goal is < 140/90.  Patient Instructions: 1)  Please schedule a follow-up appointment in 2 months.  Prevention & Chronic Care Immunizations   Influenza vaccine: Fluvax 3+  (12/26/2008)    Tetanus booster: 04/23/2005: Tdap    Pneumococcal vaccine: Historical   (06/04/2006)    H. zoster vaccine: Not documented  Colorectal Screening   Hemoccult: Not documented    Colonoscopy: Location:  [facility].    (12/12/2008)   Colonoscopy due: 12/2018  Other Screening   PSA: Not documented   Smoking status: never  (08/28/2009)  Lipids   Total Cholesterol: 152  (07/04/2009)   LDL: 56  (10/12/2008)   LDL Direct: 81.1  (07/04/2009)   HDL: 44.20  (07/04/2009)   Triglycerides: 113.0  (10/12/2008)    SGOT (AST): 22  (01/31/2009)   SGPT (ALT): 25  (01/31/2009)   Alkaline phosphatase: 64  (01/31/2009)   Total bilirubin: 1.0  (01/31/2009)  Hypertension   Last Blood Pressure: 140 / 78  (08/28/2009)   Serum creatinine: 1.8  (07/09/2009)   Serum potassium 3.8  (07/09/2009)    Hypertension flowsheet reviewed?: Yes   Progress toward BP goal: Improved  Self-Management Support :    Hypertension self-management support: Not documented    Lipid self-management support: Not documented

## 2010-04-22 NOTE — Progress Notes (Signed)
Summary: med rf & questions  Phone Note Call from Patient Call back at Work Phone [phone] Call back at [phone]   Caller: Patient Call For: Dr Lovell Sheehan Summary of Call: pt need a rf,  meloxicam 15mg  90-day, Question: In the middle of the night pt has trouble with his hip (arthritus preferably) he wants to know if it's okay to take 2 pills a day, if so, can he get another rx?  Also, need a rx for ondansetron 8mg  for nausea. pt said he was told to see a neuroligist, Dr Sandria Manly, has not heard anything back. pls call    [facility]  [phone] Initial call taken by: Shan Levans,  December 15, 2006 3:18 PM  Follow-up for Phone Call        Referral is in progress see orders ( you can check that in the computor!!!!!!!!) He may not take two mobic but may use noight time tylenol arthritis strength Follow-up by: Stacie Glaze MD,  December 16, 2006 8:15 AM  Additional Follow-up for Phone Call Additional follow up Details #1::        VM left on pt work phone, [phone] Additional Follow-up by: Sid Falcon LPN,  December 16, 2006 9:02 AM

## 2010-04-22 NOTE — Progress Notes (Signed)
Summary: CT results  Phone Note Call from Patient   Caller: Patient Call For: Dr Lovell Sheehan Summary of Call: Pt would like to get CT results. He also heard from his pharmacist that grapefruits would affect his chol meds and wants to know if that is true. [phone] Initial call taken by: Lynann Beaver CMA,  January 10, 2008 9:41 AM  Follow-up for Phone Call        yes the lipid drugs are ;lessened by grapefruit as it stimulate enzymes in the liver  CT showed chronic sinusities and should be referred back to ENT Follow-up by: Stacie Glaze MD,  January 10, 2008 5:35 PM  Additional Follow-up for Phone Call Additional follow up Details #1::        Spoke to pt and he would like his appt scheduled with Dr. Annalee Genta for chronic sinusitis. Additional Follow-up by: Lynann Beaver CMA,  January 11, 2008 8:53 AM      Appended Document: CT results referral for dr shoemaker sent to [facility]

## 2010-04-22 NOTE — Progress Notes (Signed)
Summary: LMTCB 12/23, elevated BP  Phone Note Call from Patient Call back at Work Phone [phone]   Reason for Call: Talk to Nurse Summary of Call: Taking Benicar/Hct one daily.  He went off the Micardis that Dr. Lovell Sheehan changed him to. BP 186/103  On Micardis: 195/109  Is concerned that his BP cuff is wrong. [phone] After 3 pm [phone] Initial call taken by: Lynann Beaver CMA,  March 14, 2008 8:20 AM  Follow-up for Phone Call        go back on benicar with the diuretic- dr Lovell Sheehan feels like he needs diuretic- it will take at least a week for bp to come down after starting benicar- go to pharmacy after being on benicar for about a week and have bp taken and take his bp cuff with him and take it after pharmacy takes it to see if close-that way he can tell if bp cuff is working properly. per dr Lovell Sheehan Follow-up by: Willy Eddy, LPN,  March 14, 2008 9:26 AM  Additional Follow-up for Phone Call Additional follow up Details #1::        LMTCB  see above message called to pt by [facility] Additional Follow-up by: Lynann Beaver CMA,  March 14, 2008 10:40 AM      Appended Document: LMTCB 12/23, elevated BP Pt informed and he voiced his understanding

## 2010-04-22 NOTE — Progress Notes (Signed)
Summary: bloated,nausea cramps   Phone Note Call from Patient Call back at Home Phone [phone]   Caller: Patient Call For: Jarold Motto Reason for Call: Talk to Nurse Summary of Call: Patient states that since prep and procedure on 9-22 he has had a lot of nausea, cramping and feels bloated wants to know why is he feeling like this becuase he has never had this problem before. Initial call taken by: Tawni Levy,  December 18, 2008 8:37 AM  Follow-up for Phone Call        Spoke with pt's wife.  Having cramps, gas, bloating and nausea since he had colon/endo.  Also started taking fiber suppliments as instructed for diverticulosis.  No fever or diarrhea. Asks if this is normal.  Pt left off fiber last pm and this am to see is symptoms improved.  Pt [phone] if needed. Ashok Cordia RN  December 18, 2008 8:51 AM   Additional Follow-up for Phone Call Additional follow up Details #1::        Rx Align daily for 3 weeks... Additional Follow-up by: Mardella Layman MD [facility],  December 18, 2008 9:04 AM    Additional Follow-up for Phone Call Additional follow up Details #2::    Pt notified.  will report back if no improvement. Ashok Cordia RN  December 18, 2008 11:09 AM

## 2010-04-22 NOTE — Progress Notes (Signed)
Summary: crestor  Phone Note Call from Patient Call back at Work Phone [phone]   Caller: Patient Call For: Stacie Glaze MD Summary of Call: Pt is calling requesting samples of Crestor. [phone] Initial call taken by: Lynann Beaver CMA,  December 05, 2008 9:35 AM  Follow-up for Phone Call        none available Follow-up by: Willy Eddy, LPN,  December 05, 2008 11:31 AM

## 2010-04-22 NOTE — Miscellaneous (Signed)
Summary: Orders Update pft charges  Clinical Lists Changes  Orders: Added new Service order of Carbon Monoxide diffusing w/capacity (94720) - Signed Added new Service order of Lung Volumes (94240) - Signed Added new Service order of Spirometry (Pre & Post) (94060) - Signed 

## 2010-04-22 NOTE — Progress Notes (Signed)
Summary: chest pressure (LMTCB 09/19/2007)  Phone Note Call from Patient   Caller: Patient Call For: Dr. Lovell Sheehan Summary of Call: Pt started with fever, chills and chest "pressure Thursday.......Marland Kitchenfelt sick all weekend, and still having this pressure.  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  Would like to be seen today. [phone] Initial call taken by: Lynann Beaver CMA,  September 19, 2007 8:41 AM  Follow-up for Phone Call        probable virus, tylenol rest and fluids if diaphoresis, radiating chest pain, exertinal chest pain to ER best to rest/avoid sick contact at this point Follow-up by: Stacie Glaze MD,  September 19, 2007 8:46 AM  Additional Follow-up for Phone Call Additional follow up Details #1::        LMTCB  Pt given Dr. Lovell Sheehan' recommendations. Lynann Beaver [facility]  September 19, 2007 9:49 AM Additional Follow-up by: Lynann Beaver CMA,  September 19, 2007 9:20 AM

## 2010-04-22 NOTE — Assessment & Plan Note (Signed)
Summary: EPIGASTRIC PAIN / FREQUENT BM'S // RS   Vital Signs:  Patient profile:   70 year old male Weight:      199 pounds Temp:     98.1 degrees F oral BP sitting:   130 / 80  (left arm) Cuff size:   regular  Vitals Entered By: Sid Falcon LPN (December 04, 2009 3:01 PM) CC: Epigastric pain   History of Present Illness: Patient seen with upper abdominal pain past 2-3 weeks. Had some chronic intermittent somewhat similar complaints in the past.  He relates the pain is achy quality relatively constant and moderate severity. Location is below the midepigastric region (about midway between midepigastric and umbilicus) with some radiation bilaterally. He has a long history of reflux but states this pain is different. Had EGD and colonoscopy last year which were basically unremarkable. He takes proton pump inhibitor regularly. Has taken Mylanta without relief. No clear exacerbating features. No alleviating factor.  Patient had some nausea but no vomiting. No change in bowel habits. No appetite or weight changes. History cholecystectomy.  Patient is an ex-smoker. History of other medical problems including dyslipidemia, hypertension, and coronary artery disease with prior bypass.  Allergies: 1)  ! Vicodin (Hydrocodone-Acetaminophen) 2)  ! Bromfenex 3)  ! Doxycycline Hyclate (Doxycycline Hyclate) 4)  ! Ketek (Telithromycin) 5)  ! Promethazine Hcl (Promethazine Hcl) 6)  ! Provigil (Modafinil) 7)  ! Codeine 8)  Sulfa 9)  Ery-Tab (Erythromycin Base) 10)  Biaxin (Clarithromycin) 11)  Hydrocodone-Acetaminophen (Hydrocodone-Acetaminophen)  Past History:  Past Medical History: Last updated: 06/10/2009 Asthma......Marland KitchenDr Charna Archer > dxed 2002. On QVAR since then. REcollects PFTs/spiro but that was normal Coronary artery disease.........Marland KitchenDr Antoine Poche > S/p CABG 2001, Normal perfusion study 2011 Depression GERD Hyperlipidemia IBS medial meniscal tear degerative disc  dz Hypertension Anemia Arthritis Diverticulosis Obesity Pancreatitis Peptic Ulcer Disease Pneumonia Sleep Apnea >Mild on Sleep study Nov 2009, AHI Nov 2009  Past Surgical History: Last updated: 06/10/2009 Thyroidectomy partial Coronary artery bypass graft Cholecystectomy Cervical fusion Cervical laminectomy 05/26/06 Athroscopy right knee 04/2008  daldorf  Family History: Last updated: 02/28/2009 father Family History of CAD Male 1st degree relative <50 mother passed a 59 No FH of Colon Cancer:  Social History: Last updated: 05/01/2009 Married Former Smoker, quit in 1994 x 35 years avg. >1 ppd Retired Alcohol Use - yes 2 per week Daily Caffeine Use Illicit Drug Use - no Exercise regular  Risk Factors: Caffeine Use: 5+ (02/21/2007) Exercise: no (02/21/2007)  Risk Factors: Smoking Status: never (08/28/2009) Passive Smoke Exposure: no (08/28/2009) PMH-FH-SH reviewed for relevance  Review of Systems  The patient denies anorexia, fever, weight loss, chest pain, syncope, dyspnea on exertion, peripheral edema, prolonged cough, hemoptysis, melena, hematochezia, and severe indigestion/heartburn.    Physical Exam  General:  Well-developed,well-nourished,in no acute distress; alert,appropriate and cooperative throughout examination Mouth:  Oral mucosa and oropharynx without lesions or exudates.  Teeth in good repair. Neck:  No deformities, masses, or tenderness noted. Lungs:  Normal respiratory effort, chest expands symmetrically. Lungs are clear to auscultation, no crackles or wheezes. Heart:  normal rate and regular rhythm.   Abdomen:  soft, normal bowel sounds, no masses, no hepatomegaly, and no splenomegaly.  patient has some mild tenderness to palpation several centimeters below the mid epigastric region. No guarding or rebound. No pulsatile mass noted though patient is moderately obese   Impression & Recommendations:  Problem # 1:  ABDOMINAL PAIN -GENERALIZED  (ICD-789.07) no clear etiology.  Extensive Gi w/u in past.  Abd  ultrasound.  Doubt AAA but pt does have several risk factors. Orders: Radiology Referral (Radiology)  Complete Medication List: 1)  Crestor 10 Mg Tabs (Rosuvastatin calcium) .... Take 1 tablet by mouth q hs 2)  Benicar Hct 20-12.5 Mg Tabs (Olmesartan medoxomil-hctz) .[name].. 1 once daily 3)  Clarinex 5 Mg Tabs (Desloratadine) .... One by mouth daily 4)  Bl Adult Aspirin Low Strength 81 Mg Chew (Aspirin) .... Once daily 5)  Vitamin B-12 100 Mcg Tabs (Cyanocobalamin) .... One p.o. daily over-the-counter 6)  Flonase 50 Mcg/act Susp (Fluticasone propionate) .... 2 spray each nostril once daily 7)  Folic Acid 1 Mg Tabs (Folic acid) .... Once daily 8)  Mirapex 0.125 Mg Tabs (Pramipexole dihydrochloride) .... One at bedtime as needed 9)  Valium 5 Mg Tabs (Diazepam) .... At bedtime as needed 10)  Prilosec 20 Mg Cpdr (Omeprazole) .... One by mouth two times a day with samples 11)  Lac-hydrin Five 5 % Lotn (Ammonium lactate) .... Apply to affected area two times a day as needed 12)  Qvar 80 Mcg/act Aers (Beclomethasone dipropionate) .... Two puffs by mouth daily 13)  Proair Hfa 108 (90 Base) Mcg/act Aers (Albuterol sulfate) .... Wo pudd by mouth  q 6 hour prn 14)  Benefiber Powd (Wheat dextrin) .... Mix powder in water. 15)  Nitrostat 0.4 Mg Subl (Nitroglycerin) .[name].. 1 tablet under tongue at onset of chest pain; you may repeat every 5 minutes for up to 3 doses. 16)  Miralax Powd (Polyethylene glycol 3350) .... Daily 17)  Align Caps (Probiotic product) .[name].. 1 by mouth daily 18)  Tramadol Hcl 50 Mg Tabs (Tramadol hcl) .... One by mouth q 6 hours as needed pain noted prior reaction to codine  Patient Instructions: 1)  We will call you regarding Ultr[facility] appt.

## 2010-04-22 NOTE — Progress Notes (Signed)
Summary: heal pain  Phone Note Call from Patient   Reason for Call: Acute Illness Summary of Call: Patient c/o right heal pain. Patient states there is a lump that has come up on his heal and its painful and hard to walk. He has put ice on it with no relief. Patient states he does not recall bumping it or doing anything to it. [facility]. Patient can be reached at [phone]. Initial call taken by: Darra Lis RMA,  October 10, 2008 8:38 AM  Follow-up for Phone Call        rt heel xray Follow-up by: Willy Eddy, LPN,  October 10, 2008 8:47 AM  New Problems: HEEL PAIN, RIGHT (ICD-729.5)   New Problems: HEEL PAIN, RIGHT (ICD-729.5)

## 2010-04-22 NOTE — Therapy (Signed)
Summary: [facility]   Imported By: Maryln Gottron 12/06/2008 14:50:21  _____________________________________________________________________  External Attachment:    Type:   Image     Comment:   External Document

## 2010-04-22 NOTE — Assessment & Plan Note (Signed)
Summary: ? stomach virus---ok per dr jenkins//ccm   Vital Signs:  Patient profile:   70 year old male Weight:      197 pounds Temp:     98.0 degrees F oral BP sitting:   118 / 64  (left arm) Cuff size:   regular  Vitals Entered By: Duard Brady LPN (July 09, 2009 10:24 AM) CC: weakness,body aches since friday, diarrhea, nausea & vomit since yesterday Is Patient Diabetic? No   Primary Care Provider:  Darryll Capers, MD  CC:  weakness, body aches since friday, diarrhea, and nausea & vomit since yesterday.  History of Present Illness: 70 year old patient has a history of coronary artery disease, hypertension, and dyslipidemia.  For the past 4 to 5 days.  He has had generalized myalgias, weakness, and episodic diarrhea, nausea and vomiting.  Over the past two days.  She has had 3 to 4 episodes of watery diarrhea.  He had a single episode of emesis yesterday.  His main complaint is generalized achiness and weakness.  He has had treated the hypertension on Benicar HCT.  He also has COPD and gastro-esophageal reflux disease.  His asthma has been stable on inhalational medications.  Denies any wheezing or increased shortness of breath.  Denies any abdominal pain  Allergies: 1)  ! Vicodin (Hydrocodone-Acetaminophen) 2)  ! Bromfenex 3)  ! Doxycycline Hyclate (Doxycycline Hyclate) 4)  ! Ketek (Telithromycin) 5)  ! Promethazine Hcl (Promethazine Hcl) 6)  ! Provigil (Modafinil) 7)  ! Codeine 8)  Sulfa 9)  Ery-Tab (Erythromycin Base) 10)  Biaxin (Clarithromycin) 11)  Hydrocodone-Acetaminophen (Hydrocodone-Acetaminophen)  Past History:  Past Medical History: Reviewed history from 06/10/2009 and no changes required. Asthma......Marland KitchenDr Charna Archer > dxed 2002. On QVAR since then. REcollects PFTs/spiro but that was normal Coronary artery disease.........Marland KitchenDr Antoine Poche > S/p CABG 2001, Normal perfusion study 2011 Depression GERD Hyperlipidemia IBS medial meniscal tear degerative disc  dz Hypertension Anemia Arthritis Diverticulosis Obesity Pancreatitis Peptic Ulcer Disease Pneumonia Sleep Apnea >Mild on Sleep study Nov 2009, AHI Nov 2009  Review of Systems       The patient complains of anorexia and muscle weakness.  The patient denies fever, weight loss, weight gain, vision loss, decreased hearing, hoarseness, chest pain, syncope, dyspnea on exertion, peripheral edema, prolonged cough, headaches, hemoptysis, abdominal pain, melena, hematochezia, severe indigestion/heartburn, hematuria, incontinence, genital sores, suspicious skin lesions, transient blindness, difficulty walking, depression, unusual weight change, abnormal bleeding, enlarged lymph nodes, angioedema, breast masses, and testicular masses.    Physical Exam  General:  overweight-appearing.  blood pressure 110/64overweight-appearing.   Head:  Normocephalic and atraumatic without obvious abnormalities. No apparent alopecia or balding. Eyes:  anicteric Ears:  hearing  aids removed.  Tympanic membranes and canals clear Mouth:  Oral mucosa and oropharynx without lesions or exudates.  Teeth in good repair. appeared well-hydrated Neck:  No deformities, masses, or tenderness noted. Lungs:  Normal respiratory effort, chest expands symmetrically. Lungs are clear to auscultation, no crackles or wheezes. O2 saturation 98 Heart:  Normal rate and regular rhythm. S1 and S2 normal without gallop, murmur, click, rub or other extra sounds. pulse rate 98 Abdomen:  obese soft and nontender.  No distention, guarding, or rebound.  Bowel sounds normal Msk:  No deformity or scoliosis noted of thoracic or lumbar spine.   Pulses:  R and L carotid,radial,femoral,dorsalis pedis and posterior tibial pulses are full and equal bilaterally Extremities:  No clubbing, cyanosis, edema, or deformity noted with normal full range of motion of all joints.  Impression & Recommendations:  Problem # 1:  GASTROENTERITIS  (ICD-558.9)  Orders: Venipuncture (16109) TLB-CBC Platelet - w/Differential (85025-CBCD) TLB-BMP (Basic Metabolic Panel-BMET) (80048-METABOL)  Problem # 2:  ASTHMA (ICD-493.90)  His updated medication list for this problem includes:    Qvar 80 Mcg/act Aers (Beclomethasone dipropionate) .[name]..[name] Two puffs by mouth daily    Proair Hfa 108 (90 Base) Mcg/act Aers (Albuterol sulfate) .[name]... Wo pudd by mouth  q 6 hour prn  His updated medication list for this problem includes:    Qvar 80 Mcg/act Aers (Beclomethasone dipropionate) .[name]..[name] Two puffs by mouth daily    Proair Hfa 108 (90 Base) Mcg/act Aers (Albuterol sulfate) .[name]... Wo pudd by mouth  q 6 hour prn  Problem # 3:  HYPERTENSION (ICD-401.9)  His updated medication list for this problem includes:    Benicar Hct 20-12.5 Mg Tabs (Olmesartan medoxomil-hctz) .[name]... 1 once daily will hold Benicar HCT until he is eating well.  Then, he will start taking Benicar, plain, and 20 mg daily.  Will recheck in 4 weeks    His updated medication list for this problem includes:    Benicar Hct 20-12.5 Mg Tabs (Olmesartan medoxomil-hctz) .[name]... 1 once daily  Complete Medication List: 1)  Crestor 10 Mg Tabs (Rosuvastatin calcium) .... Take 1 tablet by mouth q hs 2)  Benicar Hct 20-12.5 Mg Tabs (Olmesartan medoxomil-hctz) .[name].. 1 once daily 3)  Clarinex 5 Mg Tabs (Desloratadine) .... One by mouth daily 4)  Bl Adult Aspirin Low Strength 81 Mg Chew (Aspirin) .... Once daily 5)  Vitamin B-12 100 Mcg Tabs (Cyanocobalamin) .... One p.o. daily over-the-counter 6)  Flonase 50 Mcg/act Susp (Fluticasone propionate) .... 2 spray each nostril once daily 7)  Folic Acid 1 Mg Tabs (Folic acid) .... Once daily 8)  Mirapex 0.125 Mg Tabs (Pramipexole dihydrochloride) .... One at bedtime as needed 9)  Valium 5 Mg Tabs (Diazepam) .... At bedtime as needed 10)  Prilosec 20 Mg Cpdr (Omeprazole) .... One by mouth two times a day with samples 11)  Lac-hydrin Five 5 % Lotn  (Ammonium lactate) .... Apply to affected area two times a day 12)  Qvar 80 Mcg/act Aers (Beclomethasone dipropionate) .... Two puffs by mouth daily 13)  Proair Hfa 108 (90 Base) Mcg/act Aers (Albuterol sulfate) .... Wo pudd by mouth  q 6 hour prn 14)  Healthy Hearing Multivitamin  .... Take two twice daily 15)  Benefiber Powd (Wheat dextrin) .... Mix powder in water. 16)  Nitrostat 0.4 Mg Subl (Nitroglycerin) .[name].. 1 tablet under tongue at onset of chest pain; you may repeat every 5 minutes for up to 3 doses. 17)  Miralax Powd (Polyethylene glycol 3350) .... Daily 18)  Align Caps (Probiotic product) .[name].. 1 by mouth daily 19)  Tramadol Hcl 50 Mg Tabs (Tramadol hcl) .... One by mouth q 6 hours as needed pain noted prior reaction to codine 20)  Promethazine Hcl 25 Mg Tabs (Promethazine hcl) .... One every 6 hours as needed for nausea  Patient Instructions: 1)  Drink clear liquids only for the next 24 hours, then slowly add other liquids and food as you  tolerate them. 2)  Please schedule a follow-up appointment in 1 month. 3)  Hold benicar-HCT until all symptoms are resolved, and then resume Benicar 20 mg daily Prescriptions: PROMETHAZINE HCL 25 MG TABS (PROMETHAZINE HCL) one every 6 hours as needed for nausea  #20 x 0   Entered and Authorized by:   Gordy Savers  MD  Signed by:   Gordy Savers  MD on 07/09/2009   Method used:   Print then Give to Patient   RxID:   [id number] PROMETHAZINE HCL 25 MG TABS (PROMETHAZINE HCL) one every 6 hours as needed for nausea  #20 x 0   Entered and Authorized by:   Gordy Savers  MD   Signed by:   Gordy Savers  MD on 07/09/2009   Method used:   Electronically to        [facility] (retail)       [address]       [place], [place]  [address]       Ph: [phone]       Fax: [phone]   RxID:   [id number]

## 2010-04-22 NOTE — Assessment & Plan Note (Signed)
Summary: FUP/PER DRJJ/CCM   Vital Signs:  Patient Profile:   70 Years Old Male Height:     70 inches Weight:      200 pounds Temp:     98.6 degrees F oral Pulse rate:   76 / minute Resp:     14 per minute BP sitting:   140 / 80  (left arm)  Vitals Entered By: Willy Eddy, LPN (November 02, 2006 5:10 PM)               Chief Complaint:  c/o rt and leg pain/also c/ of neck pain/also c/o of "nodding off" when in meetings.  History of Present Illness: Pt is experienceing excessive daytime sonolence with episodes head nodding off  at work when still but not really feeling sleepy. Feels groggy after these events that is relieved with standing HTN stable and Lipids at goal. Not snoring per patient. No apnea per patients wife.   Current Allergies: SULFA ERY-TAB (ERYTHROMYCIN BASE) BIAXIN (CLARITHROMYCIN) HYDROCODONE-ACETAMINOPHEN (HYDROCODONE-ACETAMINOPHEN)  Past Medical History:    Reviewed history from 10/12/2006 and no changes required:       Asthma       Coronary artery disease       Depression       GERD       Hyperlipidemia       IBS       medial meniscal tear       degerative disc dz  Past Surgical History:    Reviewed history from 10/12/2006 and no changes required:       Thyroidectomy partial       Coronary artery bypass graft       Cholecystectomy       arthroscopy to knee       Cervical fusion       Cervical laminectomy 05/26/06   Social History:    Reviewed history and no changes required:       Married       Former Smoker   Risk Factors:  Tobacco use:  quit   Review of Systems  The patient denies fever, weight loss, decreased hearing, chest pain, syncope, dyspnea on exhertion, peripheral edema, prolonged cough, abdominal pain, severe indigestion/heartburn, and hematuria.     Physical Exam  General:     Well-developed,well-nourished,in no acute distress; alert,appropriate and cooperative throughout examination Head:     normocephalic  and no abnormalities observed.   Eyes:     pupils equal, pupils round, and pupils reactive to light.   Ears:     R ear normal and L ear normal.   Nose:     no nasal discharge.   Mouth:     pharynx pink and moist.   Neck:     tender with decreased ROM due to spasm Lungs:     Normal respiratory effort, chest expands symmetrically. Lungs are clear to auscultation, no crackles or wheezes. Heart:     normal rate and regular rhythm.   Abdomen:     soft and non-tender.   Msk:     palpable spasm in low backno joint warmth, no redness over joints, and no joint instability.   Pulses:     R and L carotid,radial,femoral,dorsalis pedis and posterior tibial pulses are full and equal bilaterally Extremities:     No clubbing, cyanosis, edema, or deformity noted with normal full range of motion of all joints.   Psych:     memory intact for recent and remote, normally interactive,  and good eye contact.      Impression & Recommendations:  Problem # 1:  SYMPTOM, HYPERSOMNIA W/SLEEP APNEA NOS (ICD-780.53)  Orders: Sleep Disorder Referral (Sleep Disorder) provigil sample trial  Problem # 2:  HYPERTENSION, ESSENTIAL NOS (ICD-401.9)  His updated medication list for this problem includes:    Benicar Hct 20-12.5 Mg Tabs (Olmesartan medoxomil-hctz) ..... One by mouth daily  BP today: 140/80 Prior BP: 136/80 (10/13/2006)   Problem # 3:  OSTEOARTHROSIS NOS, UNSPECIFIED SITE (ICD-715.90)  The following medications were removed from the medication list:    Celebrex 200 Mg Caps (Celecoxib) ..... One by mouth daily  His updated medication list for this problem includes:    Aspirin 325 Mg Tabs (Aspirin) ..... One daily    Meloxicam 15 Mg Tabs (Meloxicam) ..... One by mouth daily Discussed use of medications, application of heat or cold, and exercises.   Complete Medication List: 1)  Crestor 10 Mg Tabs (Rosuvastatin calcium) .... Take 1 tablet by mouth q hs 2)  Benicar Hct 20-12.5 Mg Tabs  (Olmesartan medoxomil-hctz) .... One by mouth daily 3)  Pulmicort Flexhaler 90 Mcg/act Inha (Budesonide (inhalation)) .... One puff p.o. bid 4)  Lunesta 2 Mg Tabs (Eszopiclone) .... One by mouth q hs prn 5)  Clarinex 5 Mg Tabs (Desloratadine) .... One by mouth daily prn 6)  Aspirin 325 Mg Tabs (Aspirin) .... One daily 7)  Vitamin B-12 100 Mcg Tabs (Cyanocobalamin) .... One p.o. daily over-the-counter 8)  Nasacort Aq 55 Mcg/act Aers (Triamcinolone acetonide(nasal)) .[name].. 1 spray in each nostril twice a day 9)  Folic Acid 1 Mg Tabs (Folic acid) .... Once daily 10)  Mirapex 0.125 Mg Tabs (Pramipexole dihydrochloride) .... One at bedtime as needed 11)  Valium 5 Mg Tabs (Diazepam) .... At bedtime as needed 12)  Provigil 200 Mg Tabs (Modafinil) .... One by mouth q am 13)  Meloxicam 15 Mg Tabs (Meloxicam) .... One by mouth daily   Patient Instructions: 1)  provigil    take one half every morning for one week-- then increase to one evey morning. If you become aggitated stop the medicine. 2)  Keep the appointment on the Sept 23 3)  Stop the Ginko 4)  stop the celebrex and replace with meloxicam. Use ice followed by heat to the neck

## 2010-04-22 NOTE — Consult Note (Signed)
Summary: Dr Annalee Genta note  Dr Annalee Genta note   Imported By: Kassie Mends 07/05/2007 09:15:28  _____________________________________________________________________  External Attachment:    Type:   Image     Comment:   Dr Annalee Genta note

## 2010-04-22 NOTE — Progress Notes (Signed)
Summary: Ulcer/Pain and bloating  Phone Note Call from Patient   Reason for Call: Acute Illness Summary of Call: Patient states he was dx'd with an ulcer and told to take Prilosec 2 pills a day. Patient states he was doing well up until Wednesday night and today. Patient states the pain and bloating has returned and he is concerned. He states that the pain is not as severe but he is still having pain. [facility]. Patient can be reached at [phone] or [phone]. Initial call taken by: Darra Lis RMA,  September 20, 2008 10:49 AM  Follow-up for Phone Call        per drj enkins- to gi- pt informed  Follow-up by: Willy Eddy, LPN,  September 20, 3472 11:29 AM

## 2010-04-22 NOTE — Assessment & Plan Note (Signed)
Summary: 2 MONTH FOLLOW UP/MHF   Vital Signs:  Patient profile:   70 year old male Height:      66 inches Weight:      200 pounds BMI:     32.40 Temp:     98.2 degrees F oral Pulse rate:   76 / minute Resp:     14 per minute BP sitting:   136 / 80  (left arm)  Vitals Entered By: Willy Eddy, LPN (December 26, 2008 8:34 AM)  Primary Care Provider:  Darryll Capers, MD  CC:  roa- f/u colonoscopy.  History of Present Illness: Hip pain  that started about  2 days ago. Pain over the greater trochater, increasd with activity and worse after sitting. Similar to the bursitis. Recurrent pain. No prior injections. No current xrays No back or buttocks monitering the blood pressure  Hypertension Follow-Up      This is a 70 year old man who presents for Hypertension follow-up.  The patient reports lightheadedness, but denies urinary frequency, headaches, edema, impotence, rash, and fatigue.  The patient denies the following associated symptoms: chest pain, chest pressure, exercise intolerance, dyspnea, palpitations, syncope, leg edema, and pedal edema.  Compliance with medications (by patient report) has been near 100%.  The patient reports that dietary compliance has been good.  The patient reports exercising 3-4X per week.    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 Take 1 Tablet By Mouth Q Hs 2)  Benicar Hct 20-12.5 Mg Tabs (Olmesartan Medoxomil-Hctz) .[name].. 1 Once Daily 3)  Lunesta 2 Mg  Tabs (Eszopiclone) .... One By Mouth Q Hs Prn 4)  Clarinex 5 Mg  Tabs (Desloratadine) .... One By Mouth Daily 5)  Bl Adult Aspirin Low Strength 81 Mg  Chew (Aspirin) .... Once Daily 6)  Vitamin B-12 100 Mcg  Tabs (Cyanocobalamin) .... One P.o. Daily Over-The-Counter 7)  Flonase 50 Mcg/act Susp (Fluticasone Propionate) .... 2 Spray Each Nostril Once Daily 8)  Folic Acid 1 Mg  Tabs (Folic Acid) .... Once Daily 9)  Mirapex 0.125 Mg  Tabs (Pramipexole Dihydrochloride) .... One At Bedtime As Needed 10)  Valium 5 Mg  Tabs (Diazepam) .... At Bedtime As Needed 11)  Meloxicam 15 Mg  Tabs (Meloxicam) .... One By Mouth Daily 12)  Prilosec 20 Mg Cpdr (Omeprazole) .... One By Mouth Two Times A Day With Samples 13)  Lac-Hydrin Five 5 % Lotn (Ammonium Lactate) .... Apply To Affected Area Two Times A Day 14)  Qvar 80 Mcg/act Aers (Beclomethasone Dipropionate) .... Two Puffs By Mouth Daily 15)  Proair Hfa 108 (90  Base) Mcg/act  Aers (Albuterol Sulfate) .... Wo Pudd By Mouth  Q 6 Hour Prn  Current Medications (verified): 1)  Crestor 10 Mg Tabs (Rosuvastatin Calcium) .... Take 1 Tablet By Mouth Q Hs 2)  Benicar Hct 20-12.5 Mg Tabs (Olmesartan Medoxomil-Hctz) .[name].. 1 Once Daily 3)  Lunesta 2 Mg  Tabs (Eszopiclone) .... One By Mouth Q Hs Prn 4)  Clarinex 5 Mg  Tabs (Desloratadine) .... One By Mouth Daily 5)  Bl Adult Aspirin Low Strength 81 Mg  Chew (Aspirin) .... Once Daily 6)  Vitamin B-12 100 Mcg  Tabs (Cyanocobalamin) .... One P.o. Daily Over-The-Counter 7)  Flonase 50 Mcg/act Susp (Fluticasone Propionate) .... 2  Spray Each Nostril Once Daily 8)  Folic Acid 1 Mg  Tabs (Folic Acid) .... Once Daily 9)  Mirapex 0.125 Mg  Tabs (Pramipexole Dihydrochloride) .... One At Bedtime As Needed 10)  Valium 5 Mg  Tabs (Diazepam) .... At Bedtime As Needed 11)  Meloxicam 15 Mg  Tabs (Meloxicam) .... One By Mouth Daily 12)  Prilosec 20 Mg Cpdr (Omeprazole) .... One By Mouth Two Times A Day With Samples 13)  Lac-Hydrin Five 5 % Lotn (Ammonium Lactate) .... Apply To Affected Area Two Times A Day 14)  Qvar 80 Mcg/act Aers (Beclomethasone Dipropionate) .... Two Puffs By Mouth Daily 15)  Proair Hfa 108 (90 Base) Mcg/act  Aers (Albuterol Sulfate) .... Wo Pudd By Mouth  Q 6 Hour Prn 16)  Align  Caps (Probiotic Product) .[name].. 1 Once Daily For 3 Weeks  Allergies (verified): 1)  ! Vicodin (Hydrocodone-Acetaminophen) 2)  ! Bromfenex 3)  ! Doxycycline Hyclate (Doxycycline Hyclate) 4)  ! Ketek (Telithromycin) 5)  ! Promethazine Hcl (Promethazine Hcl) 6)  ! Provigil (Modafinil) 7)  ! Codeine 8)  ! Sulfa 9)  Sulfa 10)  Ery-Tab (Erythromycin Base) 11)  Biaxin (Clarithromycin) 12)  Hydrocodone-Acetaminophen (Hydrocodone-Acetaminophen)  Past History:  Family History: Last updated: 06/28/2007 father Family History of CAD Male 1st degree relative <50 mother passed a 28  Social History: Last updated: 11/06/2008 Married Former Smoker Retired Alcohol Use - yes 2 per week Daily Caffeine Use Illicit Drug Use - no  Risk Factors: Caffeine Use: 5+ (02/21/2007) Exercise: no (02/21/2007)  Risk Factors: Smoking Status: quit (11/02/2006) Passive Smoke Exposure: no (02/21/2007)  Past medical, surgical, family and social histories (including risk factors) reviewed, and no changes noted (except as noted below).  Past Medical History: Reviewed history from 11/06/2008 and no changes required. Asthma Coronary artery disease Depression GERD Hyperlipidemia IBS medial meniscal tear degerative disc  dz Hypertension Anemia Arthritis Diverticulosis Obesity Pancreatitis Peptic Ulcer Disease Pneumonia Sleep Apnea  Past Surgical History: Reviewed history from 06/08/2008 and no changes required. Thyroidectomy partial Coronary artery bypass graft Cholecystectomy arthroscopy to knee Cervical fusion Cervical laminectomy 05/26/06 athroscopy right knee 04/2008  daldorf  Family History: Reviewed history from 06/28/2007 and no changes required. father Family History of CAD Male 1st degree relative <50 mother passed a 30  Social History: Reviewed history from 11/06/2008 and no changes required. Married Former Smoker Retired Alcohol Use - yes 2 per week Daily Caffeine Use Illicit Drug Use - no  Review of Systems       Flu Vaccine Consent Questions     Do you have a history of severe allergic reactions to this vaccine? no    Any prior history of allergic reactions to egg and/or gelatin? no    Do you have a sensitivity to the preservative Thimersol? no    Do  you have a past history of Guillan-Barre Syndrome? no    Do you currently have an acute febrile illness? no    Have you ever had a severe reaction to latex? no    Vaccine information given and explained to patient? yes    Are you currently pregnant? no    Lot Number:[id number]   Exp Date:09/19/2009   Site Given  Left Deltoid IM   Physical Exam  General:  Well-developed,well-nourished,in no acute distress; alert,appropriate and cooperative throughout examination Head:  atraumatic and male-pattern balding.   Ears:  External ear exam shows no significant lesions or deformities.  Otoscopic examination reveals clear canals, tympanic membranes are intact bilaterally without bulging, retraction, inflammation or discharge. Hearing is grossly normal bilaterally. Nose:  nose piercing noted and no external erythema.  nasal dischargemucosal pallor and mucosal erythema.   Mouth:  pharynx pink and moist.   Neck:  No deformities,  masses, or tenderness noted. Lungs:  Normal respiratory effort, chest expands symmetrically. Lungs are clear to auscultation, no crackles or wheezes. Heart:  Normal rate and regular rhythm. S1 and S2 normal without gallop, murmur, click, rub or other extra sounds. Abdomen:  no rigidity, distended, guarding, and epigastric tenderness.   Msk:  tenderness in the right hip bursa with full range of motion Neurologic:  No cranial nerve deficits noted. Station and gait are normal. Plantar reflexes are down-going bilaterally. DTRs are symmetrical throughout. Sensory, motor and coordinative functions appear intact.   Impression & Recommendations:  Problem # 1:  ABDOMINAL PAIN, EPIGASTRIC (ICD-789.06) seeing dr Jarold Motto with treatment for IBS. pain has resolved resolved  Discussed symptom control with the patient.   Problem # 2:  GERD (ICD-530.81)  His updated medication list for this problem includes:    Prilosec 20 Mg Cpdr (Omeprazole) ..... One by mouth two times a day with samples  EGD: Location: [facility]   (12/12/2008)  Labs Reviewed: Hgb: 13.8 (11/06/2008)   Hct: 39.6 (11/06/2008)  Problem # 3:  HYPERTENSION (ICD-401.9)  His updated medication list for this problem includes:    Benicar Hct 20-12.5 Mg Tabs (Olmesartan medoxomil-hctz) .[name]... 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 After care discussed. Pt tolerated procedure well.  His updated medication list for this problem includes:    Bl Adult Aspirin Low  Strength 81 Mg Chew (Aspirin) ..... Once daily    Meloxicam 15 Mg Tabs (Meloxicam) ..... One by mouth daily  Discussed use of medications, application of heat or cold, and exercises.   Orders: Depo- Medrol 40mg  (J1030) Joint Aspirate / Injection, Large (20610) T-Hip Comp Right Min 2 views (73510TC)  Complete Medication List: 1)  Crestor 10 Mg Tabs (Rosuvastatin calcium) .... Take 1 tablet by mouth q hs 2)  Benicar Hct 20-12.5 Mg Tabs (Olmesartan medoxomil-hctz) .[name].. 1 once daily 3)  Lunesta 2 Mg Tabs (Eszopiclone) .... One by mouth q hs prn 4)  Clarinex 5 Mg Tabs (Desloratadine) .... One by mouth daily 5)  Bl Adult Aspirin Low Strength 81 Mg  Chew (Aspirin) .... Once daily 6)  Vitamin B-12 100 Mcg Tabs (Cyanocobalamin) .... One p.o. daily over-the-counter 7)  Flonase 50 Mcg/act Susp (Fluticasone propionate) .... 2 spray each nostril once daily 8)  Folic Acid 1 Mg Tabs (Folic acid) .... Once daily 9)  Mirapex 0.125 Mg Tabs (Pramipexole dihydrochloride) .... One at bedtime as needed 10)  Valium 5 Mg Tabs (Diazepam) .... At bedtime as needed 11)  Meloxicam 15 Mg Tabs (Meloxicam) .... One by mouth daily 12)  Prilosec 20 Mg Cpdr (Omeprazole) .... One by mouth two times a day with samples 13)  Lac-hydrin Five 5 % Lotn (Ammonium lactate) .... Apply to affected area two times a day 14)  Qvar 80 Mcg/act Aers (Beclomethasone dipropionate) .... Two puffs by mouth daily 15)  Proair Hfa 108 (90 Base) Mcg/act Aers (Albuterol sulfate) .... Wo pudd by mouth  q 6 hour prn 16)  Align Caps (Probiotic product) .[name].. 1 once daily for 3 weeks  Other Orders: Flu Vaccine 37yrs + (29562) Administration Flu vaccine - MCR (Z3086)  Patient Instructions: 1)  Please schedule a follow-up appointment in 3 months.

## 2010-04-22 NOTE — Procedures (Signed)
Summary: Colonoscopy   Colonoscopy  Procedure date:  12/12/2008  Findings:      Location:  [facility].    Procedures Next Due Date:    Colonoscopy: 12/2018 COLONOSCOPY PROCEDURE REPORT  PATIENT:  Joshua, [name]  MR#:  [id number] BIRTHDATE:   1940-07-14, 67 yrs. old   GENDER:   male  ENDOSCOPIST:   Vania Rea. Jarold Motto, MD, [facility] Referred by:   PROCEDURE DATE:  12/12/2008 PROCEDURE:  Colonoscopy, diagnostic ASA CLASS:   Class II INDICATIONS: Iron Deficiency Anemia, abdominal pain   MEDICATIONS:    Fentanyl 50 mcg IV, Versed 6 mg IV  DESCRIPTION OF PROCEDURE:   After the risks benefits and alternatives of the procedure were thoroughly explained, informed consent was obtained.  Digital rectal exam was performed and revealed no abnormalities.   The LB [id number] endoscope was introduced through the anus and advanced to the cecum, which was identified by both the appendix and ileocecal valve, without limitations.  The quality of the prep was good, using MoviPrep.  The instrument was then slowly withdrawn as the colon was fully examined. <<PROCEDUREIMAGES>>      <<OLD IMAGES>>  FINDINGS:  Moderate diverticulosis was found throughout the colon.  No polyps or cancers were seen.   Retroflexed views in the rectum revealed no abnormalities.    The scope was then withdrawn from the patient and the procedure completed.  COMPLICATIONS:   None  ENDOSCOPIC IMPRESSION:  1) Moderate diverticulosis throughout the colon  2) No polyps or cancers RECOMMENDATIONS:  1) high fiber diet  2) Continue current colorectal screening recommendations for "routine risk" patients with a repeat colonoscopy in 10 years.  REPEAT EXAM:   No   _______________________________ Vania Rea. Jarold Motto, MD, [facility]  CC: Stacie Glaze, MD

## 2010-04-22 NOTE — Progress Notes (Signed)
Summary: [facility]-A-Nurse Report  Phone Note Call from Patient   Summary of Call: pt is seeing dr Amador Cunas today Initial call taken by: Willy Eddy, LPN,  July 15, 2009 1:30 PM     [facility] Triage Call Report Triage Record Num: [id number] Operator: Maryella Shivers Patient Name: Joshua [name] Call Date & Time: 07/13/2009 9:30:05AM Patient Phone: [phone] PCP: Darryll Capers Patient Gender: Male PCP Fax : [phone] Patient DOB: 1940-09-21 Practice Name: [facility] Reason for Call: Gram/patient calling about back pain. Onset 07/11/09.It is "on both sides above buttocks". Had stomach virus this week-saw MD, no s/s now except slight nausea. Afebrile.Came on after stomach virus.Has not taken any pain relievers. Urinating normally with no symptoms. All emergent s/s r/o per [facility] protocol. Home care advice given. Protocol(s) Used: [facility] Recommended Outcome per Protocol: Provide Home/Self Care Reason for Outcome: All other situations Care Advice: Call provider if you develop frequent urination, inability to urinate, pain or burning with urination, temperature greater than 100.5 F(38.6 C), or urine color is pink, red, smoky, or brownish-green.  ~ Consider acetaminophen as directed on label or by pharmacist/provider for pain or fever PRECAUTIONS: - If there is no history of liver disease, alcoholism, or intake of three or more alcohol drinks per day - If approved by provider during pregnancy or when breastfeeding - During pregnancy, acetaminophen should not be taken more than 3 consecutive days without telling provider - Do not exceed recommended dose or frequency  ~ Drink 6-10 eight ounce glasses (1.2-2.0 liters) of fluids per day unless previously instructed to restrict fluid intake for other medical reasons. Limit fluids that contain caffeine, sugar or alcohol.  ~ Analgesic Advice: Consider aspirin, ibuprofen, naproxen or ketoprofen for pain or  fever as directed on label or by pharmacist/provider. PRECAUTION: - If over 73 years of age, should not take longer than 1 week without consulting provider. EXCEPTIONS: - Should not be used if taking blood thinners. - Or if have history of sensitivity/allergy to any of these medications; or history of ulcer or kidney disease.  ~  ~ SYMPTOM / CONDITION MANAGEMENT 07/13/2009 9:52:44AM Page 1 of 1 [id number]

## 2010-04-22 NOTE — Letter (Signed)
Summary: [facility]   Imported By: Maryln Gottron 06/18/2008 14:39:38  _____________________________________________________________________  External Attachment:    Type:   Image     Comment:   External Document

## 2010-04-22 NOTE — Assessment & Plan Note (Signed)
Summary: roa/ressch from 05-04-07/jls  Medications Added NASONEX 50 MCG/ACT  SUSP (MOMETASONE FUROATE) two sprays q nostril daily        Vital Signs:  Patient Profile:   70 Years Old Male Height:     70 inches Weight:      206 pounds Temp:     98.2 degrees F oral Pulse rate:   76 / minute Resp:     14 per minute BP sitting:   132 / 82  (left arm)  Vitals Entered By: Willy Eddy, LPN (May 05, 2007 12:15 PM)                 Chief Complaint:  roa labs.  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        Positive major cardiovascular risk factors include male age 36 years old or older, hyperlipidemia, and hypertension.  Negative major cardiovascular risk factors include negative family history for ischemic heart disease and non-tobacco-user status.        Positive history for target organ damage include ASHD (either angina; prior MI; prior CABG).       Prior Medications Reviewed Using: List Brought by Patient  Prior Medication List:  CRESTOR 10 MG TABS (ROSUVASTATIN CALCIUM) Take 1 tablet by mouth q hs BENICAR HCT 20-12.5 MG  TABS (OLMESARTAN  MEDOXOMIL-HCTZ) one by mouth daily PULMICORT FLEXHALER 90 MCG/ACT  INHA (BUDESONIDE (INHALATION)) one puff p.o. bid LUNESTA 2 MG  TABS (ESZOPICLONE) one by mouth q hs prn CLARINEX 5 MG  TABS (DESLORATADINE) one by mouth daily BL ADULT ASPIRIN LOW STRENGTH 81 MG  CHEW (ASPIRIN) once daily VITAMIN B-12 100 MCG  TABS (CYANOCOBALAMIN) one p.o. daily over-the-counter NASACORT AQ 55 MCG/ACT  AERS (TRIAMCINOLONE ACETONIDE(NASAL)) 1 spray in each nostril twice a day FOLIC ACID 1 MG  TABS (FOLIC ACID) once daily MIRAPEX 0.125 MG  TABS (PRAMIPEXOLE DIHYDROCHLORIDE) one at bedtime as needed VALIUM 5 MG  TABS (DIAZEPAM) at bedtime as needed MELOXICAM 15 MG  TABS (MELOXICAM) one by mouth daily PRILOSEC 40 MG  CPDR (OMEPRAZOLE) once daily as needed ZITHROMAX Z-PAK 250 MG  TABS (AZITHROMYCIN) take as directed LAC-HYDRIN FIVE 5 %  LOTN (AMMONIUM LACTATE) apply to affected area two times a day   Current Allergies (reviewed today): ! VICODIN (HYDROCODONE-ACETAMINOPHEN) ! BROMFENEX ! DOXYCYCLINE HYCLATE (DOXYCYCLINE HYCLATE) ! KETEK (TELITHROMYCIN) ! PROMETHAZINE HCL (PROMETHAZINE HCL) ! PROVIGIL (MODAFINIL) SULFA ERY-TAB (ERYTHROMYCIN BASE) BIAXIN (CLARITHROMYCIN) HYDROCODONE-ACETAMINOPHEN (HYDROCODONE-ACETAMINOPHEN)  Past Medical History:    Reviewed history from 02/21/2007 and no changes required:       Asthma       Coronary artery disease       Depression       GERD       Hyperlipidemia       IBS       medial meniscal tear       degerative disc  dz       Hypertension  Past Surgical History:    Reviewed history from 10/12/2006 and no changes required:       Thyroidectomy partial       Coronary artery bypass graft       Cholecystectomy       arthroscopy to knee       Cervical fusion       Cervical laminectomy 05/26/06   Family History:    Reviewed history and no changes required:  Social History:    Reviewed history from 11/02/2006 and no changes required:       Married        Former Smoker    Review of Systems  The patient denies anorexia, fever, weight loss, vision loss, decreased hearing, hoarseness, chest pain, syncope, dyspnea on exhertion, peripheral edema, prolonged cough, hemoptysis, abdominal pain, melena, hematochezia, severe indigestion/heartburn, hematuria, incontinence, genital sores, muscle weakness, suspicious skin lesions, transient blindness, difficulty walking, depression, unusual weight change, abnormal bleeding, enlarged lymph nodes, angioedema, breast masses, and testicular masses.     Physical Exam  General:     Well-developed,well-nourished,in no acute distress; alert,appropriate and cooperative throughout examination Head:     male-pattern balding.   Eyes:     pupils equal, pupils round, and pupils reactive to light.   Nose:     nose piercing noted and no external erythema.   Mouth:     pharynx pink and moist.   Neck:     No deformities, masses, or tenderness noted. Lungs:     Normal respiratory effort, chest expands symmetrically. Lungs are clear to auscultation, no crackles or wheezes. Heart:     Normal rate and regular rhythm. S1 and S2 normal without gallop, murmur, click, rub or other extra sounds. Abdomen:     Bowel sounds positive,abdomen soft and non-tender without masses, organomegaly or hernias noted. Msk:     No deformity or scoliosis noted of thoracic or lumbar spine.   Pulses:     R and L carotid,radial,femoral,dorsalis pedis and posterior tibial pulses are full and equal bilaterally Neurologic:     No cranial nerve deficits noted. Station and gait are normal. Plantar reflexes are down-going bilaterally. DTRs are symmetrical throughout. Sensory, motor and coordinative functions appear intact.    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 One by mouth daily  BP today: 132/82 Prior BP: 133/89 (03/26/2007)  Prior 10 Yr Risk Heart Disease: N/A (12/14/2006)  Labs Reviewed: Creat: 1.44 (04/28/2007) Chol: 144 (04/28/2007)   HDL: 43 (04/28/2007)   LDL: 74 (04/28/2007)   TG: 137 (04/28/2007)   Problem # 3:  HYPERLIPIDEMIA (ICD-272.4) Assessment: Unchanged  His updated medication list for this problem includes:    Crestor 10 Mg Tabs (Rosuvastatin calcium) .[name]... Take 1 tablet by mouth q hs  Labs Reviewed: Chol: 144 (04/28/2007)   HDL: 43 (04/28/2007)   LDL: 74 (04/28/2007)   TG: 137 (04/28/2007) SGOT: 24 (04/28/2007)   SGPT: 27 (04/28/2007)  Prior 10 Yr Risk Heart Disease: N/A (12/14/2006)   Complete Medication List: 1)  Crestor 10 Mg Tabs (Rosuvastatin calcium) .... Take 1 tablet by mouth q hs 2)  Benicar Hct 20-12.5 Mg Tabs (Olmesartan medoxomil-hctz) .... One by mouth daily 3)  Pulmicort Flexhaler 90 Mcg/act Inha (  Budesonide (inhalation)) .... One puff p.o. bid 4)  Lunesta 2 Mg Tabs (Eszopiclone) .... One by mouth q hs prn 5)  Clarinex 5 Mg Tabs (Desloratadine) .... One by mouth daily 6)  Bl Adult Aspirin Low Strength 81 Mg Chew (Aspirin) .... Once daily 7)  Vitamin B-12 100 Mcg Tabs (Cyanocobalamin) .... One p.o. daily over-the-counter 8)  Nasonex 50 Mcg/act Susp (Mometasone furoate) .... Two sprays q nostril daily 9)  Folic Acid 1 Mg Tabs (Folic acid) .... Once daily 10)  Mirapex 0.125 Mg Tabs (Pramipexole dihydrochloride) .... One at bedtime as needed 11)  Valium 5 Mg Tabs (Diazepam) .... At bedtime as needed 12)  Meloxicam 15 Mg Tabs (Meloxicam) .... One by mouth daily 13)  Prilosec 40 Mg Cpdr (Omeprazole) .... Once daily as needed 14)  Zithromax Z-pak 250 Mg Tabs (Azithromycin) .... Take as directed 15)  Lac-hydrin Five 5 % Lotn (Ammonium lactate) .... Apply to affected area two times a day  Hypertension Assessment/Plan:      The patient's  hypertensive risk group is category C: Target organ damage and/or diabetes.  Today's blood pressure is 132/82.  His blood pressure goal is < 140/90.   Patient Instructions: 1)  Please schedule a follow-up appointment in april for CPX 2)  LAB  order for [facility]... 3)  BMP prior to visit, ICD-9: 4)  Hepatic Panel prior to visit, ICD-9: 5)  Lipid Panel prior to visit, ICD-9:  v70.0 6)  TSH prior to visit, ICD-9: 7)  CBC w/ Diff prior to visit, ICD-9: 8)  Urine-dip prior to visit, ICD-9: 9)  PSA prior to visit, ICD-9:    Prescriptions: NASONEX 50 MCG/ACT  SUSP (MOMETASONE FUROATE) two sprays q nostril daily  #3 vials x 3   Entered and Authorized by:   Stacie Glaze MD   Signed by:   Stacie Glaze MD on 05/05/2007   Method used:   Print then Give to Patient   RxID:   [id number]  ]  Tetanus/Td Immunization History:    Tetanus/Td # 1:  Tdap (04/23/2005)    Flexible Sigmoidoscopy  Procedure date:  06/03/2001  Findings:      repeat in 5 years    Flexible Sigmoidoscopy  Procedure date:  06/03/2001  Findings:      repeat in 5 years

## 2010-04-22 NOTE — Letter (Signed)
Summary: [facility]  [facility]   Imported By: Maryln Gottron 08/31/2008 14:28:05  _____________________________________________________________________  External Attachment:    Type:   Image     Comment:   External Document

## 2010-04-22 NOTE — Progress Notes (Signed)
Summary: Med change  Phone Note Call from Patient Call back at Work Phone [phone]   Summary of Call: 1. Patient saw Dr.[name] on Wed. and he sent him a replacement drug for Celebrax. Went to [facility] on [address]. and it wasn't there. 2. Started taking samples of Provigil on yesterday and had no head nodding expericence which is the first time in many weeks. 3. Could the anesthsia he had for the last 3 operations he had cause some of his problems like forgetting details, not checking work, etc.? 4. The medication that was given to him 16 yrs. ago by the neurologist is Nortriptyline.  Pharmacy: [facility] Initial call taken by: Barnie Mort,  November 04, 2006 10:00 AM  Follow-up for Phone Call        Keep on the provigil The med was mobic and the it was sent electronically..... check with the pharmacy again and have them check their electron sends. I can't comment on the anthesia issue... many peoplbe has has thought problems following anethesia. Follow-up by: Stacie Glaze MD,  November 04, 2006 12:41 PM  Additional Follow-up for Phone Call Additional follow up Details #1::        Left message on machine about staying on meds, mobic sent thru EMR and the problem with anethesia. Additional Follow-up by: Romualdo Bolk, CMA,  November 04, 2006 12:44 PM

## 2010-04-22 NOTE — Assessment & Plan Note (Signed)
Summary: m2a/ccm/PT RESCD/CCM    Vital Signs:  Patient Profile:   70 Years Old Male Height:     70 inches Weight:      203 pounds Temp:     98.5 degrees F oral Pulse rate:   76 / minute Resp:     14 per minute BP sitting:   132 / 80  (left arm)                 Chief Complaint:  roa.  History of Present Illness: Pt was retired ( forced) and mood has decreased with dizzyness and fatigue and multiple somatic complaints.    Hypertension History:      He complains of headache, but denies chest pain, palpitations, dyspnea with exertion, orthopnea, PND, peripheral edema, visual symptoms, neurologic problems, syncope, and side effects from treatment.        Positive major cardiovascular risk factors include male age 33 years old or older, hyperlipidemia, and hypertension.  Negative major cardiovascular risk factors include negative family history for ischemic heart disease and non-tobacco-user status.        Positive history for target organ damage include ASHD (either angina; prior MI; prior CABG).     Current Allergies (reviewed today): ! VICODIN (HYDROCODONE-ACETAMINOPHEN) ! BROMFENEX ! DOXYCYCLINE HYCLATE (DOXYCYCLINE HYCLATE) ! KETEK (TELITHROMYCIN) ! PROMETHAZINE HCL (PROMETHAZINE HCL) ! PROVIGIL (MODAFINIL) SULFA ERY-TAB (ERYTHROMYCIN BASE) BIAXIN (CLARITHROMYCIN) HYDROCODONE-ACETAMINOPHEN (HYDROCODONE-ACETAMINOPHEN) Updated/Current Medications (including changes made in today's visit):  CRESTOR 10 MG TABS (ROSUVASTATIN CALCIUM) Take 1 tablet by mouth q hs BENICAR HCT 20-12.5 MG  TABS (OLMESARTAN MEDOXOMIL-HCTZ) one by mouth daily PULMICORT FLEXHALER 90 MCG/ACT  INHA (BUDESONIDE (INHALATION)) one puff p.o. bid LUNESTA 2 MG  TABS (ESZOPICLONE) one by mouth q hs prn CLARINEX 5 MG  TABS (DESLORATADINE) one by mouth daily BL ADULT ASPIRIN LOW STRENGTH 81 MG  CHEW (ASPIRIN) once daily VITAMIN B-12 100 MCG  TABS (CYANOCOBALAMIN) one p.o. daily over-the-counter NASACORT AQ  55 MCG/ACT  AERS (TRIAMCINOLONE ACETONIDE(NASAL)) 1 spray in each nostril twice a day FOLIC ACID 1 MG  TABS (FOLIC ACID) once daily MIRAPEX 0.125 MG  TABS (PRAMIPEXOLE DIHYDROCHLORIDE) one at bedtime as needed VALIUM 5 MG  TABS (DIAZEPAM) at bedtime as needed MELOXICAM 15 MG  TABS (MELOXICAM) one by mouth daily PRILOSEC 40 MG  CPDR (OMEPRAZOLE) once daily as needed ZITHROMAX Z-PAK 250 MG  TABS (AZITHROMYCIN) take as directed LAC-HYDRIN FIVE 5 %  LOTN (AMMONIUM LACTATE) apply to affected area two times a day   Past Medical History:    Reviewed history from 10/12/2006 and no changes required:       Asthma       Coronary artery disease       Depression       GERD       Hyperlipidemia       IBS       medial meniscal tear       degerative disc dz       Hypertension  Past Surgical History:    Reviewed history from 10/12/2006 and no changes required:       Thyroidectomy partial       Coronary artery bypass graft       Cholecystectomy       arthroscopy to knee       Cervical fusion       Cervical laminectomy 05/26/06   Social History:    Reviewed history from 11/02/2006 and no changes required:  Married       Former Smoker   Risk Factors:  Passive smoke exposure:  no Drug use:  no HIV high-risk behavior:  no Caffeine use:  5+ drinks per day Alcohol use:  no Exercise:  no  Family History Risk Factors:    Family History of MI in females < 65 years old:  no    Family History of MI in males < 60 years old:  no   Review of Systems       The patient complains of anorexia and depression.  The patient denies weight loss, vision loss, decreased hearing, syncope, dyspnea on exhertion, muscle weakness, suspicious skin lesions, transient blindness, difficulty walking, and abnormal bleeding.     Physical Exam  General:     Well-developed,well-nourished,in no acute distress; alert,appropriate and cooperative throughout examination Mouth:     Oral mucosa and oropharynx  without lesions or exudates.  Teeth in good repair. Neck:     No deformities, masses, or tenderness noted. Lungs:     Normal respiratory effort, chest expands symmetrically. Lungs are clear to auscultation, no crackles or wheezes. Heart:     Normal rate and regular rhythm. S1 and S2 normal without gallop, murmur, click, rub or other extra sounds. Abdomen:     Bowel sounds positive,abdomen soft and non-tender without masses, organomegaly or hernias noted. Msk:     No deformity or scoliosis noted of thoracic or lumbar spine.   Pulses:     R and L carotid,radial,femoral,dorsalis pedis and posterior tibial pulses are full and equal bilaterally Extremities:     No clubbing, cyanosis, edema, or deformity noted with normal full range of motion of all joints.   Psych:     subdued, withdrawn, and poor concentration.      Impression & Recommendations:  Problem # 1:  SYMPTOM, MEMORY LOSS (ICD-780.93) concentration, job stress...   Problem # 2:  SINUSITIS, CHRONIC NOS (ICD-473.9)  His updated medication list for this problem includes:    Nasacort Aq 55 Mcg/act Aers (Triamcinolone acetonide(nasal)) .[name]... 1 spray in each nostril twice a day    Zithromax Z-pak 250 Mg Tabs (Azithromycin) .[name]... Take as directed Take antibiotics for full duration. Discussed treatment options including indications for coronal CT scan of sinuses and ENT referral.   Problem # 3:  HYPERTENSION, ESSENTIAL NOS (ICD-401.9)  His updated medication list for this problem includes:    Benicar Hct 20-12.5 Mg Tabs (Olmesartan medoxomil-hctz) ..... One by mouth daily  BP today: 132/80 Prior BP: 150/88 (12/14/2006)  Prior 10 Yr Risk Heart Disease: N/A (12/14/2006)   Problem # 4:  DEPRESSION (ICD-311) monitering  of mood is important, cobra worrries, income... His updated medication list for this problem includes:    Valium 5 Mg Tabs (Diazepam) .[name]... At bedtime as needed Discussed treatment options, including trial of  antidpressant medication. Will refer to behavioral health. Follow-up call in in 24-48 hours and recheck in 2 weeks, sooner as needed. Patient agrees to call if any worsening of symptoms or thoughts of doing harm arise. Verified that the patient has no suicidal ideation at this time.   Complete Medication List: 1)  Crestor 10 Mg Tabs (Rosuvastatin calcium) .... Take 1 tablet by mouth q hs 2)  Benicar Hct 20-12.5 Mg Tabs (Olmesartan medoxomil-hctz) .... One by mouth daily 3)  Pulmicort Flexhaler 90 Mcg/act Inha (Budesonide (inhalation)) .... One puff p.o. bid 4)  Lunesta 2 Mg Tabs (Eszopiclone) .... One by mouth q hs prn 5)  Clarinex  5 Mg Tabs (Desloratadine) .... One by mouth daily 6)  Bl Adult Aspirin Low Strength 81 Mg Chew (Aspirin) .... Once daily 7)  Vitamin B-12 100 Mcg Tabs (Cyanocobalamin) .... One p.o. daily over-the-counter 8)  Nasacort Aq 55 Mcg/act Aers (Triamcinolone acetonide(nasal)) .[name].. 1 spray in each nostril twice a day 9)  Folic Acid 1 Mg Tabs (Folic acid) .... Once daily 10)  Mirapex 0.125 Mg Tabs (Pramipexole dihydrochloride) .... One at bedtime as needed 11)  Valium 5 Mg Tabs (Diazepam) .... At bedtime as needed 12)  Meloxicam 15 Mg Tabs (Meloxicam) .... One by mouth daily 13)  Prilosec 40 Mg Cpdr (Omeprazole) .... Once daily as needed 14)  Zithromax Z-pak 250 Mg Tabs (Azithromycin) .... Take as directed 15)  Lac-hydrin Five 5 % Lotn (Ammonium lactate) .... Apply to affected area two times a day  Hypertension Assessment/Plan:      The patient's hypertensive risk group is category C: Target organ damage and/or diabetes.  Today's blood pressure is 132/80.  His blood pressure goal is < 140/90.   Patient Instructions: 1)  Please schedule a follow-up appointment in 2 months.    ]

## 2010-04-22 NOTE — Assessment & Plan Note (Signed)
Summary: cpx/mae   Vital Signs:  Patient Profile:   70 Years Old Male Height:     70 inches Weight:      204 pounds Temp:     98.2 degrees F oral Pulse rate:   79 / minute Resp:     14 per minute BP sitting:   138 / 88  (left arm)  Vitals Entered By: Willy Eddy, LPN (June 27, 4401 10:54 AM)                 Chief Complaint:  cpx/dt in2007/flex in 2003.  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 VICODIN (HYDROCODONE-ACETAMINOPHEN) ! BROMFENEX ! DOXYCYCLINE HYCLATE (DOXYCYCLINE HYCLATE) ! KETEK (TELITHROMYCIN) ! PROMETHAZINE HCL (PROMETHAZINE HCL) ! PROVIGIL (MODAFINIL) SULFA ERY-TAB (ERYTHROMYCIN BASE) BIAXIN (CLARITHROMYCIN) HYDROCODONE-ACETAMINOPHEN (HYDROCODONE-ACETAMINOPHEN)  Past Medical History:    Reviewed history from 02/21/2007 and no changes required:       Asthma       Coronary artery disease       Depression       GERD       Hyperlipidemia       IBS       medial meniscal tear       degerative disc dz       Hypertension  Past Surgical History:    Reviewed history from 10/12/2006 and no changes required:       Thyroidectomy partial       Coronary artery bypass graft       Cholecystectomy       arthroscopy to knee       Cervical fusion       Cervical laminectomy 05/26/06   Family History:    Reviewed history and no changes required:       father       Family History of CAD Male 1st degree relative <50       mother       passed a 6  Social History:  Reviewed history from 11/02/2006 and no changes required:       Married       Former Smoker       Retired    Review of Systems  The patient denies anorexia, fever, weight loss, weight gain, vision loss, decreased hearing, hoarseness, chest pain, syncope, dyspnea on exhertion, peripheral edema, prolonged cough, hemoptysis, abdominal pain, melena, hematochezia, severe indigestion/heartburn, hematuria, incontinence, genital sores, muscle weakness, suspicious skin lesions, transient blindness, difficulty walking, depression, unusual weight change, abnormal bleeding, enlarged lymph nodes, angioedema, breast masses, and testicular masses.     Physical Exam  General:     Well-developed,well-nourished,in no acute distress; alert,appropriate and cooperative throughout examination Head:     male-pattern balding.   Eyes:     pupils equal, pupils round, and pupils reactive to light.   Ears:     External ear exam shows no significant lesions or deformities.  Otoscopic examination reveals clear canals, tympanic membranes are intact bilaterally without bulging, retraction, inflammation or discharge. Hearing is grossly normal bilaterally. Nose:     nose piercing noted and no external erythema.   Mouth:     pharynx pink and moist.  Neck:     No deformities, masses, or tenderness noted. Chest Wall:     No deformities, masses, tenderness or gynecomastia noted. Breasts:     No masses or gynecomastia noted Lungs:     Normal respiratory effort, chest expands symmetrically. Lungs are clear to auscultation, no crackles or wheezes.    Impression & Recommendations:  Problem # 1:  HYPERTENSION (ICD-401.9)  His updated medication list for this problem includes:    Benicar Hct 20-12.5 Mg Tabs (Olmesartan medoxomil-hctz) ..... One by mouth daily  Orders: EKG w/ Interpretation (93000)  BP today: 138/88 Prior BP: 132/82 (05/05/2007)  Prior 10 Yr Risk Heart Disease: N/A (12/14/2006)  Labs  Reviewed: Creat: 1.44 (04/28/2007) Chol: 144 (04/28/2007)   HDL: 43 (04/28/2007)   LDL: 74 (04/28/2007)   TG: 137 (04/28/2007)   Problem # 2:  HYPERLIPIDEMIA (ICD-272.4)  His updated medication list for this problem includes:    Crestor 10 Mg Tabs (Rosuvastatin calcium) .[name]... Take 1 tablet by mouth q hs  Labs Reviewed: Chol: 144 (04/28/2007)   HDL: 43 (04/28/2007)   LDL: 74 (04/28/2007)   TG: 137 (04/28/2007) SGOT: 24 (04/28/2007)   SGPT: 27 (04/28/2007)  Prior 10 Yr Risk Heart Disease: N/A (12/14/2006)   Problem # 3:  PREVENTIVE HEALTH CARE (ICD-V70.0)  Reviewed preventive care protocols, scheduled due services, and updated immunizations.   Problem # 4:  ASTHMA (ICD-493.90)  His updated medication list for this problem includes:    Pulmicort Flexhaler 90 Mcg/act Inha (Budesonide (inhalation)) ..... One puff p.o. bid   Complete Medication List: 1)  Crestor 10 Mg Tabs (Rosuvastatin calcium) .... Take 1 tablet by mouth q hs 2)  Benicar Hct 20-12.5 Mg Tabs (Olmesartan medoxomil-hctz) .... One by mouth daily 3)  Pulmicort Flexhaler 90 Mcg/act Inha (Budesonide (inhalation)) .... One puff p.o. bid 4)  Lunesta 2 Mg Tabs (Eszopiclone) .... One by mouth q hs prn 5)  Clarinex 5 Mg Tabs (Desloratadine) .... One by mouth daily 6)  Bl Adult Aspirin Low Strength 81 Mg Chew (Aspirin) .... Once daily 7)  Vitamin B-12 100 Mcg Tabs (Cyanocobalamin) .... One p.o. daily over-the-counter 8)  Nasonex 50 Mcg/act Susp (Mometasone furoate) .... Two sprays q nostril daily 9)  Folic Acid 1 Mg Tabs (Folic acid) .... Once daily 10)  Mirapex 0.125 Mg Tabs (Pramipexole dihydrochloride) .... One at bedtime as needed 11)  Valium 5 Mg Tabs (Diazepam) .... At bedtime as needed 12)  Meloxicam 15 Mg Tabs (Meloxicam) .... One by mouth daily 13)  Prilosec 40 Mg Cpdr (Omeprazole) .... Once daily as needed   Patient Instructions: 1)  Need to find out formulary alternatives for benicar 2)  Please schedule a  follow-up appointment in 3 months.    ]

## 2010-04-22 NOTE — Progress Notes (Signed)
Summary: BP questions?  Phone Note Call from Patient   Caller: Patient Call For: Stacie Glaze MD Reason for Call: Acute Illness Summary of Call: Pt is taking Benicar with out HCTZ, and his BP has been as high as 145/95.  Wondering if he should go back on the combination. [phone] Initial call taken by: Lynann Beaver CMA,  Jul 26, 2009 8:43 AM  Follow-up for Phone Call        per dr Lovell Sheehan- go back to the benicar with hctz Follow-up by: Willy Eddy, LPN,  Jul 27, 4538 9:28 AM  Additional Follow-up for Phone Call Additional follow up Details #1::        Pt. notified. Additional Follow-up by: Lynann Beaver CMA,  Jul 26, 2009 9:32 AM

## 2010-04-22 NOTE — Progress Notes (Signed)
Summary: Wants another drug to replace provigil  Phone Note Call from Patient Call back at Work Phone [phone]   Caller: Patient Call For: [name] Reason for Call: Talk to Nurse, Talk to Doctor Summary of Call: Joshua [name] TO CALL BACK  Follow-up for Phone Call        F/Uon provigil withdrwal he says that he was already taking 1/2 of a pill  is there another drug to take instead? Follow-up by: Arcola Jansky, RN,  November 11, 2006 2:49 PM  Additional Follow-up for Phone Call Additional follow up Details #1::        may consider adding adderal after the providgil Additional Follow-up by: Stacie Glaze MD,  November 11, 2006 3:39 PM    Additional Follow-up for Phone Call Additional follow up Details #2::    talked with wife and she said pt had stopped and was feeling fine ,buy may need another med. appt made for sept 4 Follow-up by: Willy Eddy, LPN,  November 11, 2006 5:02 PM

## 2010-04-22 NOTE — Progress Notes (Signed)
Summary: may now require precert  Phone Note From Other Clinic   Caller: [facility] Call For: Lovell Sheehan Summary of Call: on 9-17 when he was referrd he had [facility] and it required no precert.  He now has medicare with [facility] secondary and they need to know if it will require precert and his appt is Nov 11 Denean [phone] Initial call taken by: Roselle Locus,  January 27, 2008 10:11 AM  Follow-up for Phone Call        Spoke with Danean Follow-up by: Florentina Addison,  January 30, 2008 3:39 PM

## 2010-04-22 NOTE — Assessment & Plan Note (Addendum)
Summary: 2 month roa \\dbb    Vital Signs:  Patient Profile:   70 Years Old Male Height:     70 inches Weight:      202 pounds Temp:     98.0 degrees F BP sitting:   150 / 88  Vitals Entered By: Sindy Guadeloupe RN (December 14, 2006 8:02 AM)                 Chief Complaint:  follow up--sinus problems-- stomach pains--feels very run down.  History of Present Illness: Not feeling well for 1 and 1/2 weeks with sinus and or allergies with nasal and sinus pain interferring with sleep. Nasal discharge was bloody.  Also  increased abd pain. Loose stools, nausia, reports low grade fevers.  Asthma flair. Not using rescue inhailer.  Hypertension History:      He complains of headache, but denies chest pain, palpitations, dyspnea with exertion, orthopnea, PND, peripheral edema, visual symptoms, neurologic problems, syncope, and side effects from treatment.  He notes no problems with any antihypertensive medication side effects.        Positive major cardiovascular risk factors include male age 66 years old or older, hyperlipidemia, and hypertension.  Negative major cardiovascular risk factors include non-tobacco-user status.        Positive history for target organ damage include ASHD (either angina; prior MI; prior CABG).     Current Allergies (reviewed today): ! VICODIN (HYDROCODONE-ACETAMINOPHEN) ! BROMFENEX (BROMPHENIRAMINE-PSEUDOEPH) ! DOXYCYCLINE HYCLATE (DOXYCYCLINE HYCLATE) ! KETEK (TELITHROMYCIN) ! PROMETHAZINE HCL (PROMETHAZINE HCL) ! PROVIGIL (MODAFINIL) SULFA ERY-TAB (ERYTHROMYCIN BASE) BIAXIN (CLARITHROMYCIN) HYDROCODONE-ACETAMINOPHEN (HYDROCODONE-ACETAMINOPHEN)  Past Medical History:    Reviewed history from 10/12/2006 and no changes required:       Asthma       Coronary artery disease       Depression       GERD       Hyperlipidemia       IBS       medial meniscal tear       degerative disc dz  Past Surgical History:    Reviewed history from 10/12/2006 and  no changes required:       Thyroidectomy partial       Coronary artery bypass graft       Cholecystectomy       arthroscopy to knee       Cervical fusion       Cervical laminectomy 05/26/06     Review of Systems       The patient complains of anorexia, fever, hoarseness, dyspnea on exhertion, and abdominal pain.  The patient denies weight loss, vision loss, decreased hearing, peripheral edema, prolonged cough, hemoptysis, melena, hematochezia, severe indigestion/heartburn, and hematuria.         Non productive cough. Sore throat   Physical Exam  General:     Well-developed,well-nourished,in no acute distress; alert,appropriate and cooperative throughout examination Head:     Normocephalic and atraumatic without obvious abnormalities. No apparent alopecia or balding. Nose:     no external deformity and no external erythema.   Mouth:     pharyngeal erythema, posterior lymphoid hypertrophy, and postnasal drip.   Neck:     supple.   Lungs:     normal respiratory effort, no accessory muscle use, and no dullness.   Heart:     normal rate, regular rhythm, no murmur, and no gallop.   Abdomen:     soft, non-tender, normal bowel sounds, and no distention.   Msk:  no joint swelling and no joint warmth.   Extremities:     No clubbing, cyanosis, edema, or deformity noted with normal full range of motion of all joints.   Neurologic:     No cranial nerve deficits noted. Station and gait are normal. Plantar reflexes are down-going bilaterally. DTRs are symmetrical throughout. Sensory, motor and coordinative functions appear intact.    Impression & Recommendations:  Problem # 1:  SINUSITIS, CHRONIC NOS (ICD-473.9)  His updated medication list for this problem includes:    Nasacort Aq 55 Mcg/act Aers (Triamcinolone acetonide(nasal)) .[name]... 1 spray in each nostril twice a day    Zithromax Z-pak 250 Mg Tabs (Azithromycin) .[name]... Take as directed Take antibiotics for full duration.  Discussed treatment options including indications for coronal CT scan of sinuses and ENT referral. Has ENT referral for surgery   Problem # 2:  HYPERTENSION, ESSENTIAL NOS (ICD-401.9) feeling ill His updated medication list for this problem includes:    Benicar Hct 20-12.5 Mg Tabs (Olmesartan medoxomil-hctz) ..... One by mouth daily  BP today: 150/88 Prior BP: 140/80 (11/02/2006)  10 Yr Risk Heart Disease: N/A   Problem # 3:  SYMPTOM, HYPERSOMNIA W/SLEEP APNEA NOS (ICD-780.53) CPAP (cm H20):   Mask Type:   Mask Size:  has not seen pulmonologist for mask but is better post the sinus surgery  Problem # 4:  DEPRESSION (ICD-311) poor job satifaction, deceased concentration. Asked to be checked for dementia. His updated medication list for this problem includes:    Valium 5 Mg Tabs (Diazepam) .[name]... At bedtime as needed refused other meds at this time Discussed treatment options, including trial of antidpressant medication. Will refer to behavioral health. Follow-up call in in 24-48 hours and recheck in 2 weeks, sooner as needed. Patient agrees to call if any worsening of symptoms or thoughts of doing harm arise. Verified that the patient has no suicidal ideation at this time.  His updated medication list for this problem includes:    Valium 5 Mg Tabs (Diazepam) .[name]... At bedtime as needed   Complete Medication List: 1)  Crestor 10 Mg Tabs (Rosuvastatin calcium) .... Take 1 tablet by mouth q hs 2)  Benicar Hct 20-12.5 Mg Tabs (Olmesartan medoxomil-hctz) .... One by mouth daily 3)  Pulmicort Flexhaler 90 Mcg/act Inha (Budesonide (inhalation)) .... One puff p.o. bid 4)  Lunesta 2 Mg Tabs (Eszopiclone) .... One by mouth q hs prn 5)  Clarinex 5 Mg Tabs (Desloratadine) .... One by mouth daily 6)  Bl Adult Aspirin Low Strength 81 Mg Chew (Aspirin) .... Once daily 7)  Vitamin B-12 100 Mcg Tabs (Cyanocobalamin) .... One p.o. daily over-the-counter 8)  Nasacort Aq 55 Mcg/act Aers (Triamcinolone  acetonide(nasal)) .[name].. 1 spray in each nostril twice a day 9)  Folic Acid 1 Mg Tabs (Folic acid) .... Once daily 10)  Mirapex 0.125 Mg Tabs (Pramipexole dihydrochloride) .... One at bedtime as needed 11)  Valium 5 Mg Tabs (Diazepam) .... At bedtime as needed 12)  Meloxicam 15 Mg Tabs (Meloxicam) .... One by mouth daily 13)  Prilosec 40 Mg Cpdr (Omeprazole) .... Once daily as needed 14)  Cvs Iron 325 (65 Fe) Mg Tabs (Ferrous sulfate) .... Once daily 15)  Zithromax Z-pak 250 Mg Tabs (Azithromycin) .... Take as directed 16)  Medrol 4 Mg Tabs (Methylprednisolone) .... 3 for 3 days, 2 for 2 days and 1 for i day  Other Orders: Neurology Referral (Neuro)  Hypertension Assessment/Plan:      The patient's hypertensive risk group  is category C: Target organ damage and/or diabetes.  Today's blood pressure is 150/88.  His blood pressure goal is < 140/90.   Patient Instructions: 1)  Please schedule a follow-up appointment in 2 months.    Prescriptions: ZITHROMAX Z-PAK 250 MG  TABS (AZITHROMYCIN) take as directed  #1 pack x 0   Entered and Authorized by:   Stacie Glaze MD   Signed by:   Stacie Glaze MD on 12/14/2006   Method used:   Print then Give to Patient   RxID:   [id number] MEDROL 4 MG  TABS (METHYLPREDNISOLONE) 3 for 3 days, 2 for 2 days and 1 for i day  #14 x 0   Entered and Authorized by:   Stacie Glaze MD   Signed by:   Stacie Glaze MD on 12/14/2006   Method used:   Print then Give to Patient   RxID:   [id number]  ]  Appended Document: 2 month roa \\dbb       Current Allergies: ! VICODIN (HYDROCODONE-ACETAMINOPHEN) ! BROMFENEX (BROMPHENIRAMINE-PSEUDOEPH) ! DOXYCYCLINE HYCLATE (DOXYCYCLINE HYCLATE) ! KETEK (TELITHROMYCIN) ! PROMETHAZINE HCL (PROMETHAZINE HCL) ! PROVIGIL (MODAFINIL) SULFA ERY-TAB (ERYTHROMYCIN BASE) BIAXIN (CLARITHROMYCIN) HYDROCODONE-ACETAMINOPHEN (HYDROCODONE-ACETAMINOPHEN)        Complete Medication List: 1)  Crestor 10 Mg  Tabs (Rosuvastatin calcium) .... Take 1 tablet by mouth q hs 2)  Benicar Hct 20-12.5 Mg Tabs (Olmesartan medoxomil-hctz) .... One by mouth daily 3)  Pulmicort Flexhaler 90 Mcg/act Inha (Budesonide (inhalation)) .... One puff p.o. bid 4)  Lunesta 2 Mg Tabs (Eszopiclone) .... One by mouth q hs prn 5)  Clarinex 5 Mg Tabs (Desloratadine) .... One by mouth daily 6)  Bl Adult Aspirin Low Strength 81 Mg Chew (Aspirin) .... Once daily 7)  Vitamin B-12 100 Mcg Tabs (Cyanocobalamin) .... One p.o. daily over-the-counter 8)  Nasacort Aq 55 Mcg/act Aers (Triamcinolone acetonide(nasal)) .[name].. 1 spray in each nostril twice a day 9)  Folic Acid 1 Mg Tabs (Folic acid) .... Once daily 10)  Mirapex 0.125 Mg Tabs (Pramipexole dihydrochloride) .... One at bedtime as needed 11)  Valium 5 Mg Tabs (Diazepam) .... At bedtime as needed 12)  Meloxicam 15 Mg Tabs (Meloxicam) .... One by mouth daily 13)  Prilosec 40 Mg Cpdr (Omeprazole) .... Once daily as needed 14)  Cvs Iron 325 (65 Fe) Mg Tabs (Ferrous sulfate) .... Once daily 15)  Zithromax Z-pak 250 Mg Tabs (Azithromycin) .... Take as directed 16)  Medrol 4 Mg Tabs (Methylprednisolone) .... 3 for 3 days, 2 for 2 days and 1 for i day   Patient Instructions: 1)  Please schedule a follow-up appointment in 2 months.    ]

## 2010-04-22 NOTE — Progress Notes (Signed)
Summary: Abd Pain   Phone Note Call from Patient Call back at Home Phone [phone]   Call For: Dr Jarold Motto Reason for Call: Talk to Nurse Summary of Call: Stomach feels very hard and hurts-not something debilitating but it is painful. Did start taking fiber after he had Endo procedure in Sept and wonders if this has something to do with it. Initial call taken by: Leanor Kail [facility],  January 30, 2009 8:45 AM  Follow-up for Phone Call        Pt states he has been having upper abd pain. Bloating.  States stomach feels hard.  No appetite.  Req OV.  Appt sch for 01/31/09 with Rozetta Nunnery NP. Follow-up by: Ashok Cordia RN,  January 30, 2009 9:06 AM

## 2010-04-22 NOTE — Assessment & Plan Note (Signed)
Summary: ABD PAIN/YF    History of Present Illness Visit Type: Initial Consult Primary GI MD: Sheryn Bison MD FACP FAGA Primary Provider: Darryll Capers, MD Requesting Provider: Darryll Capers, MD Chief Complaint: Abdominal pain, lower x 4 weeks, however not present now History of Present Illness:   This patient is a 70 year old Caucasian male referred through the courtesy of Dr. Darryll Capers for evaluation of abdominal pain.  This patient has a long history of acid reflux and has been on Prilosec twice a day on and off for several years. 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 In June of this year he developed midabdominal pain felt to possibly be diverticulitis and was treated with ciprofloxacin and metronidazole without improvement. He gives a history of clinical diverticulitis one year ago. He was placed on Prilosec has had good relief of his midabdominal pain. He has alternating diarrhea and constipation and apparently had a colonoscopy 20 years ago in [place] that was normal. He denies melena or hematochezia.  Status post cholecystectomy and denies epidural or complaints. He does have chronic arthritis and is on Mobic 15 mg daily. He also takes aspirin 81 mg a day for cardiovascular protection. He has multiple drug allergies are listed and reviewed his chart. He denies abuse of alcohol or cigarettes. He has had previous coronary artery bypass surgery and suffers from hyperlipidemia, hypertension, and obesity. He also has been diagnosed as having sleep apnea, as had partial thyroidectomy, and several spinal fusion operations. Family history is noncontributory.   GI Review of Systems    Reports chest pain and  heartburn.      Denies abdominal pain, acid reflux, belching, bloating, dysphagia with liquids, dysphagia with solids, loss of appetite, nausea, vomiting, vomiting blood, weight loss, and  weight gain.      Reports change in bowel  habits.     Denies anal fissure, black tarry stools, constipation, diarrhea, diverticulosis, fecal incontinence, heme positive stool, hemorrhoids, irritable bowel syndrome, jaundice, light color stool, liver problems, rectal bleeding, and  rectal pain.    Current Medications (verified): 1)  Crestor 10 Mg Tabs (Rosuvastatin Calcium) .... Take 1 Tablet By Mouth Q Hs 2)  Benicar Hct 20-12.5 Mg Tabs (Olmesartan Medoxomil-Hctz) .[name].. 1 Once Daily 3)  Lunesta 2 Mg  Tabs (Eszopiclone) .... One By Mouth Q Hs Prn 4)  Clarinex 5 Mg  Tabs (Desloratadine) .... One By Mouth Daily 5)  Bl Adult Aspirin Low Strength 81 Mg  Chew (Aspirin) .... Once Daily 6)  Vitamin B-12 100 Mcg  Tabs (Cyanocobalamin) .... One P.o. Daily Over-The-Counter 7)  Flonase 50 Mcg/act Susp (Fluticasone Propionate) .... 2 Spray Each Nostril Once Daily 8)  Folic Acid 1 Mg  Tabs (Folic Acid) .... Once Daily 9)  Mirapex 0.125 Mg  Tabs (Pramipexole Dihydrochloride) .... One At Bedtime As Needed 10)  Valium 5 Mg  Tabs (Diazepam) .... At Bedtime As Needed 11)  Meloxicam 15 Mg  Tabs (Meloxicam) .... One By Mouth Daily 12)  Prilosec 20 Mg Cpdr (Omeprazole) .... One By Mouth Two Times A Day With Samples 13)  Lac-Hydrin Five 5 % Lotn (Ammonium Lactate) .... Apply To Affected Area Two Times A Day 14)  Qvar 80 Mcg/act Aers (Beclomethasone Dipropionate) .... Two Puffs By Mouth Daily 15)  Proair Hfa 108 (90 Base) Mcg/act  Aers (Albuterol Sulfate) .... Wo Pudd By Mouth  Q 6 Hour Prn  Allergies: 1)  ! Vicodin (Hydrocodone-Acetaminophen) 2)  ! Bromfenex 3)  !  Doxycycline Hyclate (Doxycycline Hyclate) 4)  ! Ketek (Telithromycin) 5)  ! Promethazine Hcl (Promethazine Hcl) 6)  ! Provigil (Modafinil) 7)  ! Codeine 8)  Sulfa 9)  Ery-Tab (Erythromycin Base) 10)  Biaxin (Clarithromycin) 11)  Hydrocodone-Acetaminophen (Hydrocodone-Acetaminophen)  Past History:  Past medical, surgical, family and social histories (including risk factors) reviewed for  relevance to current acute and chronic problems.  Past Medical History: Asthma Coronary artery disease Depression GERD Hyperlipidemia IBS medial meniscal tear degerative disc dz Hypertension Anemia Arthritis Diverticulosis Obesity Pancreatitis Peptic Ulcer Disease Pneumonia Sleep Apnea  Past Surgical History: Reviewed history from 06/08/2008 and no changes required. Thyroidectomy partial Coronary artery bypass graft Cholecystectomy arthroscopy to knee Cervical fusion Cervical laminectomy 05/26/06 athroscopy right knee 04/2008  daldorf  Family History: Reviewed history from 06/28/2007 and no changes required. father Family History of CAD Male 1st degree relative <50 mother passed a 43  Social History: Reviewed history from 06/28/2007 and no changes required. Married Former Smoker Retired Alcohol Use - yes 2 per week Daily Caffeine Use Illicit Drug Use - no  Review of Systems       The patient complains of allergy/sinus, arthritis/joint pain, back pain, confusion, depression-new, hearing problems, muscle pains/cramps, sleeping problems, urination - excessive, urination changes/pain, and voice change.  The patient denies anemia, anxiety-new, blood in urine, breast changes/lumps, change in vision, cough, coughing up blood, fainting, fatigue, fever, headaches-new, heart murmur, heart rhythm changes, itching, menstrual pain, night sweats, nosebleeds, pregnancy symptoms, shortness of breath, skin rash, sore throat, swelling of feet/legs, swollen lymph glands, thirst - excessive , urination - excessive , urine leakage, and vision changes.    Vital Signs:  Patient profile:   70 year old male Height:      66 inches Weight:      200.25 pounds BMI:     32.44 Pulse rate:   76 / minute BP sitting:   116 / 70  (left arm) Cuff size:   regular  Vitals Entered By: June McMurray CMA Duncan Dull) (November 06, 2008 9:36 AM)  Physical Exam  General:  Well developed, well nourished,  no acute distress.healthy appearing.   Head:  Normocephalic and atraumatic. Eyes:  PERRLA, no icterus.exam deferred to patient's ophthalmologist.   Neck:  Supple; no masses or thyromegaly. Lungs:  Clear throughout to auscultation. Heart:  Regular rate and rhythm; no murmurs, rubs,  or bruits. Abdomen:  Soft, nontender and nondistended. No masses, hepatosplenomegaly or hernias noted. Normal bowel sounds. Rectal:  Normal exam.hemocult negative.   Prostate:  .normal size prostate.   Msk:  Symmetrical with no gross deformities. Normal posture. Pulses:  Normal pulses noted. Extremities:  No clubbing, cyanosis, edema or deformities noted. Neurologic:  Alert and  oriented x4;  grossly normal neurologically. Cervical Nodes:  No significant cervical adenopathy. Inguinal Nodes:  No significant inguinal adenopathy. Psych:  Alert and cooperative. Normal mood and affect.   Impression & Recommendations:  Problem # 1:  ABDOMINAL PAIN, EPIGASTRIC (ICD-789.06) Assessment Improved He has atypical symptoms and may have NSAID-induced gastritis-peptic ulcer disease. There is a vague history in his record of previous pancreatitis probably related to gallstones and he has had a cholecystectomy. I repeated a few basic laboratory parameters, we'll schedule him for endoscopic exam, and we'll continue his PPI therapy. Orders: TLB-CBC Platelet - w/Differential (85025-CBCD) TLB-BMP (Basic Metabolic Panel-BMET) (80048-METABOL) TLB-Hepatic/Liver Function Pnl (80076-HEPATIC) TLB-TSH (Thyroid Stimulating Hormone) (84443-TSH) TLB-Ferritin (82728-FER) TLB-B12, Serum-Total ONLY (56213-Y86) TLB-Folic Acid (Folate) (82746-FOL) TLB-IBC Pnl (Iron/FE;Transferrin) (83550-IBC) T-Sprue Panel (Celiac Disease Aby  Eval) (83516x3/[address]) T-Tissue Transglutamase Ab IgA (16109-60454)  Problem # 2:  IRRITABLE BOWEL SYNDROME (ICD-564.1) Assessment: Unchanged he seems to have alternating diarrhea and constipation with gas and  bloating all consistent with IBS. I scheduled him for colonoscopy exam at his convenience because his been more than 10 years since his last exam.  Problem # 3:  HYPERTENSION (ICD-401.9) Assessment: Improved blood pressure today is 116/70 and we have asked him to continue his antihypertensives per Dr. Lovell Sheehan.  Problem # 4:  ANEMIA, IRON DEFICIENCY (ICD-280.9) Assessment: Improved it is unknown clear to me if he really has had iron deficiency in the past. Anemia profile has been ordered and we will do endoscopic exams and small bowel biopsy to exclude celiac disease. Orders: TLB-CBC Platelet - w/Differential (85025-CBCD) TLB-BMP (Basic Metabolic Panel-BMET) (80048-METABOL) TLB-Hepatic/Liver Function Pnl (80076-HEPATIC) TLB-TSH (Thyroid Stimulating Hormone) (84443-TSH) TLB-Ferritin (82728-FER) TLB-B12, Serum-Total ONLY (09811-B14) TLB-Folic Acid (Folate) (82746-FOL) TLB-IBC Pnl (Iron/FE;Transferrin) (83550-IBC) T-Sprue Panel (Celiac Disease Aby Eval) (83516x3/[address]) T-Tissue Transglutamase Ab IgA (78295-62130)  Patient Instructions: 1)  Copy sent to : Dr. Darryll Capers 2)  Please continue current medications.  3)  Constipation and Hemorrhoids brochure given.  4)  Colonoscopy and Flexible Sigmoidoscopy brochure given.  5)  Conscious Sedation brochure given.  6)  Upper Endoscopy brochure given.  7)  Labs pending  Appended Document: Orders Update    Clinical Lists Changes  Medications: Added new medication of MOVIPREP 100 GM  SOLR (PEG-KCL-NACL-NASULF-NA ASC-C) As per prep instructions. - Signed Rx of MOVIPREP 100 GM  SOLR (PEG-KCL-NACL-NASULF-NA ASC-C) As per prep instructions.;  #1 x 0;  Signed;  Entered by: Harlow Mares CMA (AAMA);  Authorized by: Mardella Layman MD [facility];  Method used: Electronically to [facility] [address], [place], [place], [place]  [address], Ph: [phone], Fax: [phone] Orders: Added new Test order of  Colon/Endo (Colon/Endo) - Signed    Prescriptions: MOVIPREP 100 GM  SOLR (PEG-KCL-NACL-NASULF-NA ASC-C) As per prep instructions.  #1 x 0   Entered by:   Harlow Mares CMA (AAMA)   Authorized by:   Mardella Layman MD FACG,FAGA   Signed by:   Harlow Mares CMA (AAMA) on 11/06/2008   Method used:   Electronically to        [facility] (retail)       [address]       [place], [place]  [address]       Ph: [phone]       Fax: [phone]   RxID:   [id number]

## 2010-04-22 NOTE — Miscellaneous (Signed)
Summary: previsit  Clinical Lists Changes  Medications: Added new medication of MOVIPREP 100 GM  SOLR (PEG-KCL-NACL-NASULF-NA ASC-C) As directed - Signed Rx of MOVIPREP 100 GM  SOLR (PEG-KCL-NACL-NASULF-NA ASC-C) As directed;  #1 x 0;  Signed;  Entered by: Clide Cliff RN;  Authorized by: Mardella Layman MD [facility];  Method used: Electronically to [facility] [address] [place], [place], [place]  [address], Ph: [phone], Fax: [phone] Allergies: Added new allergy or adverse reaction of SULFA Observations: Added new observation of ALLERGY REV: Done (11/27/2008 8:58)    Prescriptions: MOVIPREP 100 GM  SOLR (PEG-KCL-NACL-NASULF-NA ASC-C) As directed  #1 x 0   Entered by:   Clide Cliff RN   Authorized by:   Mardella Layman MD [facility]   Signed by:   Clide Cliff RN on 11/27/2008   Method used:   Electronically to        [facility] (retail)       [address]       [place], [place]  [address]       Ph: [phone]       Fax: [phone]   RxID:   [id number]

## 2010-04-22 NOTE — Progress Notes (Signed)
Summary: rx change  Phone Note Call from Patient Call back at [phone]   Caller: Patient---voice mail Summary of Call: Was prescribed an abx on yesterday. Having a reaction to the myacin family. Please change to a different rx [facility] to [facility] on [address] Initial call taken by: Warnell Forester,  January 07, 2010 3:27 PM    New/Updated Medications: CEPHALEXIN 500 MG CAPS (CEPHALEXIN) 1 four times a day for 10 days Prescriptions: CEPHALEXIN 500 MG CAPS (CEPHALEXIN) 1 four times a day for 10 days  #40 x 0   Entered by:   Willy Eddy, LPN   Authorized by:   Stacie Glaze MD   Signed by:   Willy Eddy, LPN on 25/36/6440   Method used:   Electronically to        [facility] [address] (retail)       [address]       [place], [place]  [address]       Ph: [phone]       Fax: [phone]   RxID:   [id number]

## 2010-04-22 NOTE — Progress Notes (Signed)
Summary: GES   Phone Note Outgoing Call Call back at [facility] Phone [phone]   Call placed by: Harlow Mares CMA [facility]),  February 21, 2010 1:10 PM Call placed to: Patient Summary of Call: called pt to schedule his GES its scheduled for 03/13/2010, pt aware. I also advised pt i walked a disc of her CT [facility] today. Initial call taken by: Harlow Mares CMA (AAMA),  February 21, 2010 1:27 PM

## 2010-04-22 NOTE — Assessment & Plan Note (Signed)
Summary: severe back pain/dm   Vital Signs:  Patient profile:   70 year old male Weight:      197 pounds Temp:     98.6 degrees F oral BP sitting:   120 / 80  (right arm) Cuff size:   regular  Vitals Entered By: Duard Brady LPN (July 15, 2009 1:45 PM) CC: c/o (L) low back pain, "left over from last week " , no fall no injury Is Patient Diabetic? No   Primary Care Provider:  Darryll Capers, MD  CC:  c/o (L) low back pain, "left over from last week " , and no fall no injury.  History of Present Illness: 70 year old patient has a history of osteoarthritis and multiple medical problems.  He has had some low back pain in the past. For the past 5 days, he has had pain in the left lumbar region with some radiation to the left leg.  He describes the pain as being constant and aggravated by movement.  It has a burning quality and difficult time sleeping due to the pain.  Denies any constitutional symptoms, such as anorexia, fever or chills.  Preventive Screening-Counseling & Management  Alcohol-Tobacco     Smoking Status: never  Allergies: 1)  ! Vicodin (Hydrocodone-Acetaminophen) 2)  ! Bromfenex 3)  ! Doxycycline Hyclate (Doxycycline Hyclate) 4)  ! Ketek (Telithromycin) 5)  ! Promethazine Hcl (Promethazine Hcl) 6)  ! Provigil (Modafinil) 7)  ! Codeine 8)  Sulfa 9)  Ery-Tab (Erythromycin Base) 10)  Biaxin (Clarithromycin) 11)  Hydrocodone-Acetaminophen (Hydrocodone-Acetaminophen)  Past History:  Past Medical History: Reviewed history from 06/10/2009 and no changes required. Asthma......Marland KitchenDr Charna Archer > dxed 2002. On QVAR since then. 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  uncomfortable but in no acute distress.  Blood pressure normal.  Afebrileoverweight-appearing.   Msk:  straight leg test was mildly positive on the left with aggravation of his lumbar pain Neurologic:  foot and toe flexion and extension normal.  Reflexes symmetrical   Impression & Recommendations:  Problem # 1:  BACK PAIN (ICD-724.5)  His updated medication list for this problem includes:    Bl Adult Aspirin Low Strength 81 Mg Chew (Aspirin) ..... Once daily    Tramadol Hcl 50 Mg Tabs (Tramadol hcl) ..... One by mouth q 6 hours as needed pain noted prior reaction to codine  His updated medication list for this problem includes:    Bl Adult Aspirin Low Strength 81 Mg Chew (Aspirin) ..... Once daily    Tramadol Hcl 50 Mg Tabs (Tramadol hcl) ..... One by mouth q 6 hours as needed pain noted prior reaction to codine  Problem # 2:  LOC OSTEOARTHROS NOT SPEC PRIM/SEC PELV RGN&THI (ICD-715.35)  His updated medication list for this problem includes:    Bl Adult Aspirin Low Strength 81 Mg Chew (Aspirin) ..... Once daily    Tramadol Hcl 50 Mg Tabs (Tramadol hcl) ..... One by mouth q 6 hours as needed pain noted prior reaction to codine  His updated medication list for this problem includes:    Bl Adult Aspirin Low Strength 81 Mg Chew (Aspirin) ..... Once daily    Tramadol Hcl 50  Mg Tabs (Tramadol hcl) ..... One by mouth q 6 hours as needed pain noted prior reaction to codine  Complete Medication List: 1)  Crestor 10 Mg Tabs (Rosuvastatin calcium) .... Take 1 tablet by mouth q hs 2)  Benicar Hct 20-12.5 Mg Tabs (Olmesartan medoxomil-hctz) .[name].. 1 once daily 3)  Clarinex 5 Mg Tabs (Desloratadine) .... One by mouth daily 4)  Bl Adult Aspirin Low Strength 81 Mg Chew (Aspirin) .... Once daily 5)  Vitamin B-12 100 Mcg Tabs (Cyanocobalamin) .... One p.o. daily  over-the-counter 6)  Flonase 50 Mcg/act Susp (Fluticasone propionate) .... 2 spray each nostril once daily 7)  Folic Acid 1 Mg Tabs (Folic acid) .... Once daily 8)  Mirapex 0.125 Mg Tabs (Pramipexole dihydrochloride) .... One at bedtime as needed 9)  Valium 5 Mg Tabs (Diazepam) .... At bedtime as needed 10)  Prilosec 20 Mg Cpdr (Omeprazole) .... One by mouth two times a day with samples 11)  Lac-hydrin Five 5 % Lotn (Ammonium lactate) .... Apply to affected area two times a day 12)  Qvar 80 Mcg/act Aers (Beclomethasone dipropionate) .... Two puffs by mouth daily 13)  Proair Hfa 108 (90 Base) Mcg/act Aers (Albuterol sulfate) .... Wo pudd by mouth  q 6 hour prn 14)  Healthy Hearing Multivitamin  .... Take two twice daily 15)  Benefiber Powd (Wheat dextrin) .... Mix powder in water. 16)  Nitrostat 0.4 Mg Subl (Nitroglycerin) .[name].. 1 tablet under tongue at onset of chest pain; you may repeat every 5 minutes for up to 3 doses. 17)  Miralax Powd (Polyethylene glycol 3350) .... Daily 18)  Align Caps (Probiotic product) .[name].. 1 by mouth daily 19)  Tramadol Hcl 50 Mg Tabs (Tramadol hcl) .... One by mouth q 6 hours as needed pain noted prior reaction to codine 20)  Promethazine Hcl 25 Mg Tabs (Promethazine hcl) .... One every 6 hours as needed for nausea 21)  Prednisone 20 Mg Tabs (Prednisone) .... One twice daily for 7 to 10 days  Patient Instructions: 1)  Most patients (90%) with low back pain will improve with time (2-6 weeks). Keep active but avoid activities that are painful. Apply moist heat and/or ice to lower back several times a day. 2)  prednisoneTake twice daily for 7 to 10 days 3)  Valium twice daily as needed Prescriptions: PREDNISONE 20 MG TABS (PREDNISONE) one twice daily for 7 to 10 days  #20 x 0   Entered and Authorized by:   Gordy Savers  MD   Signed by:   Gordy Savers  MD on 07/15/2009   Method used:   Print then Give to Patient   RxID:   [id number] VALIUM 5 MG   TABS (DIAZEPAM) at bedtime as needed  #50 x 0   Entered and Authorized by:   Gordy Savers  MD   Signed by:   Gordy Savers  MD on 07/15/2009   Method used:   Print then Give to Patient   RxID:   [id number]

## 2010-04-22 NOTE — Progress Notes (Signed)
  Phone Note Call from Patient Call back at Home Phone [phone]   Caller: Patient Call For: Stacie Glaze MD Summary of Call: Requesting 10 mg. Crestor or called to [facility]. Initial call taken by: Debby Meadows CMA AAMA,  February 25, 2010 9:20 AM    Prescriptions: CRESTOR 10 MG TABS (ROSUVASTATIN CALCIUM) Take 1 tablet by mouth q hs  #30 x 11   Entered by:   Willy Eddy, LPN   Authorized by:   Stacie Glaze MD   Signed by:   Willy Eddy, LPN on 34/74/2595   Method used:   Electronically to        [facility] [address] (retail)       [address]       [place], [place]  [address]       Ph: [phone]       Fax: [phone]   RxID:   [id number]

## 2010-04-22 NOTE — Assessment & Plan Note (Signed)
Summary: Upper abd pain/dfs    History of Present Illness Visit Type: Follow-up Visit Primary GI MD: Sheryn Bison MD FACP FAGA Primary Provider: Darryll Capers, MD Requesting Provider: Darryll Capers, MD Chief Complaint: abdominal pain History of Present Illness:   Joshua [name] is followed by Dr. Jarold Motto for GERD, IBS and anemia. He is worked in for three day history of non-radiating, mid upper abdomen which occurs intermittently throughoutt the day. The pain did wake him up a couple of nightt ago. Described as pressure. No chest pain or SOB. Pain gets slightly better with meals, it isn't positional. Feels bloated but isn't naueated. Has had pain like this before, though not nearly as bad. He takes Prilosec twice daily, which at one time helped the pain.  He has recently developed constipation with hard stool.   No recent medication changes except fiber, Align and a vitamins. We started him on Align for nausea and gas after his EGD a few ago. On Meloxicam for a year or so.   Drinks very little ETOH.    GI Review of Systems    Reports abdominal pain and  bloating.     Location of  Abdominal pain: upper abdomen.    Denies acid reflux, belching, chest pain, dysphagia with liquids, dysphagia with solids, heartburn, loss of appetite, nausea, vomiting, vomiting blood, weight loss, and  weight gain.      Reports constipation.     Denies anal fissure, black tarry stools, change in bowel habit, diarrhea, diverticulosis, fecal incontinence, heme positive stool, hemorrhoids, irritable bowel syndrome, jaundice, light color stool, liver problems, rectal bleeding, and  rectal pain.    Current Medications (verified): 1)  Crestor 10 Mg Tabs (Rosuvastatin Calcium) .... Take 1 Tablet By Mouth Q Hs 2)  Benicar Hct 20-12.5 Mg Tabs (Olmesartan Medoxomil-Hctz) .[name].. 1 Once Daily 3)  Lunesta 2 Mg  Tabs (Eszopiclone) .... One By Mouth Q Hs Prn 4)  Clarinex 5 Mg  Tabs (Desloratadine) .... One By Mouth Daily 5)   Bl Adult Aspirin Low Strength 81 Mg  Chew (Aspirin) .... Once Daily 6)  Vitamin B-12 100 Mcg  Tabs (Cyanocobalamin) .... One P.o. Daily Over-The-Counter 7)  Flonase 50 Mcg/act Susp (Fluticasone Propionate) .... 2 Spray Each Nostril Once Daily 8)  Folic Acid 1 Mg  Tabs (Folic Acid) .... Once Daily 9)  Mirapex 0.125 Mg  Tabs (Pramipexole Dihydrochloride) .... One At Bedtime As Needed 10)  Valium 5 Mg  Tabs (Diazepam) .... At Bedtime As Needed 11)  Meloxicam 15 Mg  Tabs (Meloxicam) .... One By Mouth Daily 12)  Prilosec 20 Mg Cpdr (Omeprazole) .... One By Mouth Two Times A Day With Samples 13)  Lac-Hydrin Five 5 % Lotn (Ammonium Lactate) .... Apply To Affected Area Two Times A Day 14)  Qvar 80 Mcg/act Aers (Beclomethasone Dipropionate) .... Two Puffs By Mouth Daily 15)  Proair Hfa 108 (90 Base) Mcg/act  Aers (Albuterol Sulfate) .... Wo Pudd By Mouth  Q 6 Hour Prn 16)  Align  Caps (Probiotic Product) .[name].. 1 Once Daily For 3 Weeks 17)  Healthy Hearing Multivitamin .... Once Daily  Allergies: 1)  ! Vicodin (Hydrocodone-Acetaminophen) 2)  ! Bromfenex 3)  ! Doxycycline Hyclate (Doxycycline Hyclate) 4)  ! Ketek (Telithromycin) 5)  ! Promethazine Hcl (Promethazine Hcl) 6)  ! Provigil (Modafinil) 7)  ! Codeine 8)  Sulfa 9)  Ery-Tab (Erythromycin Base) 10)  Biaxin (Clarithromycin) 11)  Hydrocodone-Acetaminophen (Hydrocodone-Acetaminophen)  Past History:  Past Medical History: Reviewed history from 11/06/2008  and no changes required. 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 Thyroidectomy partial Coronary artery bypass graft Cholecystectomy arthroscopy to knee Cervical fusion Cervical laminectomy 05/26/06 athroscopy right  knee 04/2008  daldorf  Family History: Reviewed history from 06/28/2007 and no changes required. father Family History of CAD Male 1st degree relative <50 mother passed a 54  Social History: Reviewed history from 11/06/2008 and no changes required. Married Former Smoker Retired Alcohol Use - yes 2 per week Daily Caffeine Use Illicit Drug Use - no  Review of Systems       The patient complains of allergy/sinus, arthritis/joint pain, back pain, fever, hearing problems, muscle pains/cramps, sleeping problems, and voice change.  The patient denies anemia, anxiety-new, blood in urine, breast changes/lumps, change in vision, confusion, cough, coughing up blood, depression-new, fainting, fatigue, headaches-new, heart murmur, heart rhythm changes, itching, menstrual pain, night sweats, nosebleeds, pregnancy symptoms, shortness of breath, skin rash, sore throat, swelling of feet/legs, swollen lymph glands, thirst - excessive , urination - excessive , urination changes/pain, urine leakage, and vision changes.    Vital Signs:  Patient profile:   70 year old male Height:      66 inches Weight:      200.25 pounds BMI:     32.44 Pulse rate:   80 / minute Pulse rhythm:   regular BP sitting:   142 / 82  (left arm) Cuff size:   regular  Vitals Entered By: June McMurray CMA [facility]) (January 31, 2009 1:58 PM)  Physical Exam  General:  Well developed, well nourished, no acute distress. Head:  Normocephalic and atraumatic. Neck:  no obvious masses  Lungs:  Clear throughout to auscultation. Heart:  Regular rate and rhythm. Abdomen:  Abdomen soft, nondistended. There is epigastric tenderness as well as mild-moderate LLQ tenderness. No obvious masses or hepatomegaly.  Normal bowel sounds.  Neurologic:  Alert and  oriented x4;  grossly normal neurologically. Psych:  Alert and cooperative. Normal mood and affect.   Impression & Recommendations:  Problem # 1:  ABDOMINAL PAIN -GENERALIZED  (ICD-789.07) Assessment New  Mid upper abdominal pain / moderate  tenderness. Surprisingly he is also moderately tender in LLQ though he hasn't really been having pain there. Etiolgy not clear. His pain not completely typical of diverticulitis or pancreatitis. Pain may be secondary to constipation / IBS. Will check basic labs.  Trial of PPI for the mid upper abdominal pain and associated bloating. I think we need to purge bowel to get a better assessment of his pain. Start Miralax, continue Fiber.  Trial of Hyomas. He will call with condition update next ,or sooner if symptoms worsen  We will call with test results and any further recommendations based on those results. Follow up with Dr. Jarold Motto.  Orders: TLB-CBC Platelet - w/Differential (85025-CBCD) TLB-CMP (Comprehensive Metabolic Pnl) (80053-COMP) TLB-Amylase (82150-AMYL) TLB-Lipase (83690-LIPASE) TLB-CRP-High Sensitivity (C-Reactive Protein) (86140-FCRP) TLB-Sedimentation Rate (ESR) (85652-ESR)  Problem # 2:  IRRITABLE BOWEL SYNDROME (ICD-564.1) Assessment: Comment Only See #1.  Patient Instructions: 1)  Please go to the lab, basement level. We will call you with results. 2)  We have given you Miralax samples and a coupon. Use daily. 3)  We have sent a prescription to [facility], [address] for Hyomax, for abd pain. 4)  Continue the fiber, samples of Benefiber given. 5)  Continue the Prilosec. 6)  We made you a follow up appointment with Dr. Jarold Motto for 02-28-09 at 10:45AM. 7)  Copy sent to : [facility] 8)  The medication list was reviewed and reconciled.  All changed / newly prescribed medications were explained.  A complete medication list was provided to the patient / caregiver. Prescriptions: HYOMAX-SL 0.125 MG SUBL (HYOSCYAMINE SULFATE) Take 1 tab every 4 hours as needed for pain  #90 x 0   Entered by:   Lowry Ram NCMA   Authorized by:   Willette Cluster NP   Signed by:   Lowry Ram NCMA on 01/31/2009   Method  used:   Electronically to        [facility] [address] (retail)       [address]       [place], [place]  [address]       Ph: [phone]       Fax: [phone]   RxID:   [id number]

## 2010-04-22 NOTE — Assessment & Plan Note (Signed)
Summary: 3 mo rov/mm/PT RESCD FROM BUMP//CCM   Vital Signs:  Patient profile:   70 year old male Height:      67 inches Weight:      200 pounds BMI:     31.44 Temp:     98.2 degrees F oral Pulse rate:   72 / minute BP sitting:   134 / 80  (left arm)  Vitals Entered By: Willy Eddy, LPN (July 04, 2009 10:12 AM) CC: roa--c/o rt ear fullness, Hypertension Management   Primary Care Provider:  Darryll Capers, MD  CC:  roa--c/o rt ear fullness and Hypertension Management.  History of Present Illness: follow up cut back on the meloxican and the GERD has improved   Hyperlipidemia Follow-Up      This is a 70 year old man who presents for Hyperlipidemia follow-up.  The patient denies muscle aches, GI upset, abdominal pain, flushing, itching, constipation, diarrhea, and fatigue.  The patient denies the following symptoms: chest pain/pressure, exercise intolerance, dypsnea, palpitations, syncope, and pedal edema.  Compliance with medications (by patient report) has been near 100%.  Dietary compliance has been excellent.  The patient reports exercising occasionally.    Hypertension History:      He denies headache, chest pain, palpitations, dyspnea with exertion, orthopnea, PND, peripheral edema, visual symptoms, neurologic problems, syncope, and side effects from treatment.        Positive major cardiovascular risk factors include male age 6 years old or older, hyperlipidemia, and hypertension.  Negative major cardiovascular risk factors include negative family history for ischemic heart disease and non-tobacco-user status.        Positive history for target organ damage include ASHD (either angina/prior MI/prior CABG).     Preventive Screening-Counseling & Management  Alcohol-Tobacco     Smoking Status: quit     Year Quit: 1993     Passive Smoke Exposure: no  Problems Prior to Update: 1)  Fatigue  (ICD-780.79) 2)  Asthma  (ICD-493.90) 3)  Thrush  (ICD-112.0) 4)  Shortness of  Breath (SOB)  (ICD-786.05) 5)  Chest Pain-unspecified  (ICD-786.50) 6)  Asthenia  (ICD-780.79) 7)  Asthma Unspecified With Exacerbation  (ICD-493.92) 8)  Pulmonary Congestion  (ICD-786.9) 9)  Adverse Drug Reaction  (ICD-995.20) 10)  Constipation  (ICD-564.00) 11)  Abdominal Pain -generalized  (ICD-789.07) 12)  Loc Osteoarthros Not Spec Prim/sec Pelv Rgn&thi  (ICD-715.35) 13)  Hip Pain, Right  (ICD-719.45) 14)  Tobacco Abuse, Hx of  (ICD-V15.82) 15)  Anemia, Iron Deficiency  (ICD-280.9) 16)  Heel Pain, Right  (ICD-729.5) 17)  Abdominal Pain, Epigastric  (ICD-789.06) 18)  Medial Epicondylitis, 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) 19)  Preventive Health Care  (ICD-V70.0) 20)  Family History of Cad Male 1st Degree Relative <50  (ICD-V17.3) 21)  Tinnitus, Chronic, 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) 22)  Hypertension  (ICD-401.9) 23)  Symptom, Memory Loss  (ICD-780.93) 24)  Sinusitis, Chronic Nos  (ICD-473.9) 25)  Osteoarthrosis Nos, Unspecified Site  (ICD-715.90) 26)  Symptom, Hypersomnia W/sleep Apnea Nos  (ICD-780.53) 27)  Hx of Medial Meniscus Tear  (ICD-836.0) 28)  Hyperlipidemia  (ICD-272.4) 29)  Gerd  (ICD-530.81) 30)  Depression  (ICD-311) 31)  Coronary Artery Disease  (ICD-414.00) 32)  Asthma  (ICD-493.90) 33)  Irritable Bowel Syndrome  (ICD-564.1)  Current Problems (verified): 1)  Fatigue  (ICD-780.79) 2)  Asthma  (ICD-493.90) 3)  Thrush  (ICD-112.0) 4)  Shortness of Breath (SOB)  (ICD-786.05) 5)  Chest Pain-unspecified  (ICD-786.50) 6)  Asthenia  (ICD-780.79) 7)  Asthma Unspecified  With Exacerbation  (ICD-493.92) 8)  Pulmonary Congestion  (ICD-786.9) 9)  Adverse Drug Reaction  (ICD-995.20) 10)  Constipation  (ICD-564.00) 11)  Abdominal Pain -generalized  (ICD-789.07) 12)  Loc Osteoarthros Not Spec Prim/sec Pelv Rgn&thi  (ICD-715.35) 13)  Hip Pain, Right  (ICD-719.45) 14)  Tobacco Abuse, Hx of  (ICD-V15.82) 15)  Anemia, Iron Deficiency  (ICD-280.9) 16)  Heel Pain, Right  (ICD-729.5) 17)   Abdominal Pain, Epigastric  (ICD-789.06) 18)  Medial Epicondylitis, 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) 19)  Preventive Health Care  (ICD-V70.0) 20)  Family History of Cad Male 1st Degree Relative <50  (ICD-V17.3) 21)  Tinnitus, Chronic, 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 Take 1 Tablet By Mouth Q Hs 2)  Benicar Hct 20-12.5 Mg Tabs (Olmesartan Medoxomil-Hctz) .[name].. 1 Once Daily 3)  Clarinex 5 Mg  Tabs (Desloratadine) .... One By Mouth Daily 4)  Bl Adult Aspirin Low Strength 81 Mg  Chew (Aspirin) .... Once Daily 5)  Vitamin B-12 100 Mcg  Tabs (Cyanocobalamin) .... One P.o. Daily Over-The-Counter 6)  Flonase 50 Mcg/act Susp (Fluticasone Propionate) .... 2 Spray Each Nostril Once Daily 7)  Folic Acid 1 Mg  Tabs (Folic Acid) .... Once Daily 8)  Mirapex 0.125 Mg  Tabs (Pramipexole Dihydrochloride) .... One At Bedtime As Needed 9)  Valium 5 Mg  Tabs (Diazepam) .... At Bedtime As Needed 10)  Meloxicam 15 Mg  Tabs (Meloxicam) .... One By Mouth Daily 11)  Prilosec 20 Mg Cpdr (Omeprazole) .... One By Mouth Two Times A Day With Samples 12)  Lac-Hydrin Five 5 % Lotn (Ammonium Lactate) .... Apply To Affected Area Two Times A Day 13)  Qvar 80 Mcg/act Aers (Beclomethasone Dipropionate) .... Two Puffs By Mouth Daily 14)  Proair Hfa 108 (90 Base) Mcg/act  Aers (Albuterol Sulfate) .... Wo Pudd By Mouth  Q 6 Hour Prn 15)  Align  Caps (Probiotic Product) .... One Tablet By Mouth Once Daily 16)   Healthy Hearing Multivitamin .... Take Two Twice Daily 17)  Benefiber  Powd (Wheat Dextrin) .... Mix Powder in Water. 18)  Nitrostat 0.4 Mg Subl (Nitroglycerin) .[name].. 1 Tablet Under Tongue At Onset of Chest Pain; You May Repeat Every 5 Minutes For Up To 3 Doses. 19)  Miralax  Powd (Polyethylene Glycol 3350) .... Daily 20)  Align  Caps (Probiotic Product) .[name].. 1 By Mouth Daily  Current Medications (verified): 1)  Crestor 10 Mg Tabs (Rosuvastatin Calcium) .... Take 1 Tablet By Mouth Q Hs 2)  Benicar Hct 20-12.5 Mg Tabs (Olmesartan Medoxomil-Hctz) .[name].. 1 Once Daily 3)  Clarinex 5 Mg  Tabs (Desloratadine) .... One By Mouth Daily 4)  Bl Adult Aspirin Low Strength 81 Mg  Chew (Aspirin) .... Once Daily 5)  Vitamin B-12 100 Mcg  Tabs (Cyanocobalamin) .... One P.o. Daily Over-The-Counter 6)  Flonase 50 Mcg/act Susp (Fluticasone Propionate) .... 2 Spray Each Nostril Once Daily 7)  Folic Acid 1 Mg  Tabs (Folic Acid) .... Once Daily 8)  Mirapex 0.125  Mg  Tabs (Pramipexole Dihydrochloride) .... One At Bedtime As Needed 9)  Valium 5 Mg  Tabs (Diazepam) .... At Bedtime As Needed 10)  Prilosec 20 Mg Cpdr (Omeprazole) .... One By Mouth Two Times A Day With Samples 11)  Lac-Hydrin Five 5 % Lotn (Ammonium Lactate) .... Apply To Affected Area Two Times A Day 12)  Qvar 80 Mcg/act Aers (Beclomethasone Dipropionate) .... Two Puffs By Mouth Daily 13)  Proair Hfa 108 (90 Base) Mcg/act  Aers (Albuterol Sulfate) .... Wo Pudd By Mouth  Q 6 Hour Prn 14)  Align  Caps (Probiotic Product) .... One Tablet By Mouth Once Daily 15)  Healthy Hearing Multivitamin .... Take Two Twice Daily 16)  Benefiber  Powd (Wheat Dextrin) .... Mix Powder in Water. 17)  Nitrostat 0.4 Mg Subl (Nitroglycerin) .[name].. 1 Tablet Under Tongue At Onset of Chest Pain; You May Repeat Every 5 Minutes For Up To 3 Doses. 18)  Miralax  Powd (Polyethylene Glycol 3350) .... Daily 19)  Align  Caps (Probiotic Product) .[name].. 1 By Mouth Daily 20)  Tramadol Hcl 50 Mg  Tabs (Tramadol Hcl) .... One By Mouth Q 6 Hours As Needed Pain Noted Prior Reaction To Codine  Allergies (verified): 1)  ! Vicodin (Hydrocodone-Acetaminophen) 2)  ! Bromfenex 3)  ! Doxycycline Hyclate (Doxycycline Hyclate) 4)  ! Ketek (Telithromycin) 5)  ! Promethazine Hcl (Promethazine Hcl) 6)  ! Provigil (Modafinil) 7)  ! Codeine 8)  Sulfa 9)  Ery-Tab (Erythromycin Base) 10)  Biaxin (Clarithromycin) 11)  Hydrocodone-Acetaminophen (Hydrocodone-Acetaminophen)  Past History:  Family History: Last updated: 02/28/2009 father Family History of CAD Male 1st degree relative <50 mother passed a 37 No FH of Colon Cancer:  Social History: Last updated: 05/01/2009 Married Former Smoker, quit in 1994 x 35 years avg. >1 ppd Retired Alcohol Use - yes 2 per week Daily Caffeine Use Illicit Drug Use - no Exercise regular  Risk Factors: Caffeine Use: 5+ (02/21/2007) Exercise: no (02/21/2007)  Risk Factors: Smoking Status: quit (07/04/2009) Passive Smoke Exposure: no (07/04/2009)  Past medical, surgical, family and social histories (including risk factors) reviewed, and no changes noted (except as noted below).  Past Medical History: Reviewed history from 06/10/2009 and no changes required. Asthma......Marland KitchenDr Charna Archer > dxed 2002. On QVAR since then. 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 Thyroidectomy partial Coronary artery bypass graft Cholecystectomy Cervical fusion Cervical laminectomy 05/26/06 Athroscopy right knee 04/2008  daldorf  Family History: Reviewed  history from 02/28/2009 and no changes required. father Family History of CAD Male 1st degree relative <50 mother passed a 79 No FH of Colon Cancer:  Social History: Reviewed history from 05/01/2009 and no changes required. Married Former Smoker, quit in 1994 x 35 years avg. >1 ppd Retired Alcohol Use - yes 2 per week Daily Caffeine Use Illicit Drug Use - no Exercise regular  Review of Systems  The patient denies anorexia, fever, weight loss, weight gain, vision loss, decreased hearing, hoarseness, chest pain, syncope, dyspnea on exertion, peripheral edema, prolonged cough, headaches, hemoptysis, abdominal pain, melena, hematochezia, severe indigestion/heartburn, hematuria, incontinence, genital sores, muscle weakness, suspicious skin lesions, transient blindness, difficulty walking, depression, unusual weight change, abnormal bleeding, enlarged lymph nodes, angioedema,  and breast masses.    Physical Exam  General:  Well-developed,well-nourished,in no acute distress; alert,appropriate and cooperative throughout examination Head:  atraumatic and male-pattern balding.   Eyes:  pupils equal, pupils round, and pupils reactive to light.   Ears:  R ear normal and L ear normal.   Nose:  mucosal erythema and mucosal edema.   Mouth:  pharynx pink and moist.  posterior lymphoid hypertrophy and postnasal drip.   Neck:  No deformities, masses, or tenderness noted. Lungs:  no intercostal retractions.   wheeze after cough detected Heart:  Normal rate and regular rhythm. S1 and S2 normal without gallop, murmur, click, rub or other extra sounds. Abdomen:  no rigidity, distended, guarding, and epigastric tenderness.     Impression & Recommendations:  Problem # 1:  HYPERTENSION (ICD-401.9)  His updated medication list for this problem includes:    Benicar Hct 20-12.5 Mg Tabs (Olmesartan medoxomil-hctz) .[name]... 1 once daily  BP today: 134/80 Prior BP: 142/92 (06/10/2009)  Prior 10 Yr Risk  Heart Disease: N/A (12/14/2006)  Labs Reviewed: K+: 4.5 (01/31/2009) Creat: : 1.5 (01/31/2009)   Chol: 118 (10/12/2008)   HDL: 39.80 (10/12/2008)   LDL: 56 (10/12/2008)   TG: 113.0 (10/12/2008)  Orders: TLB-BMP (Basic Metabolic Panel-BMET) (80048-METABOL)  Problem # 2:  SYMPTOM, MEMORY LOSS (ICD-780.93) memory  is stable pt gives permisson for wife ot be contact for reports and ilness  Problem # 3:  ANEMIA, IRON DEFICIENCY (ICD-280.9)  His updated medication list for this problem includes:    Vitamin B-12 100 Mcg Tabs (Cyanocobalamin) ..... One p.o. daily over-the-counter    Folic Acid 1 Mg Tabs (Folic acid) ..... Once daily  Hgb: 14.3 (01/31/2009)   Hct: 41.3 (01/31/2009)   Platelets: 201.0 (01/31/2009) RBC: 4.56 (01/31/2009)   RDW: 12.6 (01/31/2009)   WBC: 9.2 (01/31/2009) MCV: 90.6 (01/31/2009)   MCHC: 34.5 (01/31/2009) Ferritin: 27.8 (11/06/2008) Iron: 54 (11/06/2008)   % Sat: 15.7 (11/06/2008) B12: >1500 pg/mL (11/06/2008)   Folate: >20.0 ng/mL (11/06/2008)   TSH: 1.14 (11/06/2008)  Problem # 4:  GERD (ICD-530.81)  stable His updated medication list for this problem includes:    Prilosec 20 Mg Cpdr (Omeprazole) ..... One by mouth two times a day with samples  EGD: Location: [facility]   (12/12/2008)  Labs Reviewed: Hgb: 14.3 (01/31/2009)   Hct: 41.3 (01/31/2009)  Orders: Venipuncture (16109) TLB-CBC Platelet - w/Differential (85025-CBCD)  Problem # 5:  HIP PAIN, RIGHT (ICD-719.45)  The following medications were removed from the medication list:    Meloxicam 15 Mg Tabs (Meloxicam) ..... One by mouth daily His updated medication list for this problem includes:    Bl Adult Aspirin Low Strength 81 Mg Chew (Aspirin) ..... Once daily    Tramadol Hcl 50 Mg Tabs (Tramadol hcl) ..... One by mouth q 6 hours as needed pain noted prior reaction to codine  Discussed use of medications, application of heat or cold, and exercises.   Complete Medication  List: 1)  Crestor 10 Mg Tabs (Rosuvastatin calcium) .... Take 1 tablet by mouth q hs 2)  Benicar Hct 20-12.5 Mg Tabs (Olmesartan medoxomil-hctz) .[name].. 1 once daily 3)  Clarinex 5 Mg Tabs (Desloratadine) .... One by mouth daily 4)  Bl Adult Aspirin Low Strength 81 Mg Chew (Aspirin) .... Once daily 5)  Vitamin B-12 100 Mcg Tabs (Cyanocobalamin) .... One p.o. daily over-the-counter 6)  Flonase 50 Mcg/act Susp (Fluticasone propionate) .... 2 spray each nostril once daily 7)  Folic Acid 1 Mg Tabs (  Folic acid) .... Once daily 8)  Mirapex 0.125 Mg Tabs (Pramipexole dihydrochloride) .... One at bedtime as needed 9)  Valium 5 Mg Tabs (Diazepam) .... At bedtime as needed 10)  Prilosec 20 Mg Cpdr (Omeprazole) .... One by mouth two times a day with samples 11)  Lac-hydrin Five 5 % Lotn (Ammonium lactate) .... Apply to affected area two times a day 12)  Qvar 80 Mcg/act Aers (Beclomethasone dipropionate) .... Two puffs by mouth daily 13)  Proair Hfa 108 (90 Base) Mcg/act Aers (Albuterol sulfate) .... Wo pudd by mouth  q 6 hour prn 14)  Healthy Hearing Multivitamin  .... Take two twice daily 15)  Benefiber Powd (Wheat dextrin) .... Mix powder in water. 16)  Nitrostat 0.4 Mg Subl (Nitroglycerin) .[name].. 1 tablet under tongue at onset of chest pain; you may repeat every 5 minutes for up to 3 doses. 17)  Miralax Powd (Polyethylene glycol 3350) .... Daily 18)  Align Caps (Probiotic product) .[name].. 1 by mouth daily 19)  Tramadol Hcl 50 Mg Tabs (Tramadol hcl) .... One by mouth q 6 hours as needed pain noted prior reaction to codine  Other Orders: TLB-Cholesterol, HDL (83718-HDL) TLB-Cholesterol, Direct LDL (83721-DIRLDL) TLB-Cholesterol, Total (82465-CHO)  Hypertension Assessment/Plan:      The patient's hypertensive risk group is category C: Target organ damage and/or diabetes.  Today's blood pressure is 134/80.  His blood pressure goal is < 140/90.  Patient Instructions: 1)  Please schedule a follow-up  appointment in 3 months. 2)  for the fullness in the ears take claritin d occasionally Prescriptions: TRAMADOL HCL 50 MG TABS (TRAMADOL HCL) one by mouth q 6 hours as needed pain noted prior reaction to codine  #60 x 11   Entered and Authorized by:   Stacie Glaze MD   Signed by:   Stacie Glaze MD on 07/04/2009   Method used:   Electronically to        [facility] (retail)       [address]       [place], [place]  [address]       Ph: [phone]       Fax: [phone]   RxID:   [id number]

## 2010-04-22 NOTE — Progress Notes (Signed)
Summary: rx to jj to sign 1-5   Phone Note Call from Patient Call back at Home Phone [phone]   Caller: Patient-live call Call For: dr jj Summary of Call: RF CRESTOR FOR MAIL ORDER. WILL PICK UP RX ON  WEDNESDAY. ALSO, WOULD LIKE MORE SAMPLES UNTIL MAIL ARRIVES. HE IS ON 10MG . Initial call taken by: Warnell Forester,  March 28, 2007 12:50 PM  Follow-up for Phone Call        Rx to [facility] for signature.  Samples with RX. ..................................................................Marland KitchenRudy Jew, RN  March 28, 2007 1:19 PM  Follow-up by: Stacie Glaze MD,  March 28, 2007 2:01 PM      Prescriptions: CRESTOR 10 MG TABS (ROSUVASTATIN CALCIUM) Take 1 tablet by mouth q hs  #90 x 3   Entered by:   Rudy Jew, RN   Authorized by:   Stacie Glaze MD   Signed by:   Rudy Jew, RN on 03/28/2007   Method used:   Print then Give to Patient   RxID:   [id number]

## 2010-04-22 NOTE — Progress Notes (Signed)
Summary: crestor  Phone Note Call from Patient Call back at Home Phone [phone]   Caller: live Call For: jenkins Summary of Call: Requesting samples of Crestor 10 mg or any size.  He is fast aproaching the Medicare gap & he's trying to push it out further, because then the Crestor will run $300.  Has had intolerance with some of the other statins. 2 SAMPLE STARTER PACKS AVAILABLE.  Patient advised.Rudy Jew, RN  December 13, 2008 9:21 AM  Initial call taken by: Rudy Jew, RN,  December 13, 2008 9:04 AM

## 2010-04-22 NOTE — Progress Notes (Signed)
Summary: SORE THROAT   Phone Note Call from Patient Call back at Home Phone [phone]   Caller: PATIENT TRIAGE MESSAGE Call For: [name] Summary of Call: [facility] [address]  DOES NOT FEEL WELL SORE THROAT FOR 3 DAYS REAL TIRED. CAN YOU CALL HIM  IN SOMETHING OR DOES HE NEED TO COME IN  Initial call taken by: Roselle Locus,  March 23, 2007 11:52 AM  Follow-up for Phone Call        Called pt back, explained Dr Lovell Sheehan was out of the office until 03/28/07.  Recommended warm salt water gargle along with sucking on ice chips for sore throat and cough.  Mucinex, and Delsym for cough/congestion sx.  Drink plenty of fluids and rest.  Call back if fever is present greater than 3 days.  Pt voiced his understanding Follow-up by: Sid Falcon LPN,  March 23, 2007 12:26 PM

## 2010-04-22 NOTE — Progress Notes (Signed)
Summary: lower back pain  Phone Note Call from Patient   Caller: Patient Call For: Dr. Lovell Sheehan Reason for Call: Talk to Doctor Summary of Call: Pt was power washing his desk, and has lower back pain since.  He would like to take his wife's muscle relaxers if you think it is ok. [phone].  She takes Baclofen. Initial call taken by: Lynann Beaver CMA,  Aug 19, 2007 8:35 AM  Follow-up for Phone Call        oko to try and message given to wife and make sure to take mobic Follow-up by: Willy Eddy, LPN,  Aug 19, 2007 11:20 AM

## 2010-04-22 NOTE — Assessment & Plan Note (Signed)
Summary: Cardiology Nuclear Study  Nuclear Med Background Indications for Stress Test: Evaluation for Ischemia, Graft Patency   History: Asthma, CABG, COPD, Heart Catheterization, Myocardial Infarction, Myocardial Perfusion Study  History Comments: '01 MI: NSTEMI > Heart Cath: EF=50%, 3V Dz > CABG x 5 '06 MPS: EF=63%, (-) ischemia  Symptoms: Chest Pain, Chest Tightness, Dizziness, DOE, Light-Headedness, SOB    Nuclear Pre-Procedure Cardiac Risk Factors: Family History - CAD, History of Smoking, Hypertension, Lipids Caffeine/Decaff Intake: None NPO After: 12:00 AM Lungs: clear IV 0.9% NS with Angio Cath: 22g     IV Site: (R) AC IV Started by: Irean Hong RN Chest Size (in) 44     Height (in): 67 Weight (lb): 194 BMI: 30.49 Tech Comments: The patient states he came in with chest pain this morning that woke him up today.  Nuclear Med Study 1 or 2 day study:  1 day     Stress Test Type:  Stress Reading MD:  Olga Millers, MD     Referring MD:  J.Hochrein Resting Radionuclide:  Technetium 79m Tetrofosmin     Resting Radionuclide Dose:  11.0 mCi  Stress Radionuclide:  Technetium 68m Tetrofosmin     Stress Radionuclide Dose:  33.0 mCi   Stress Protocol Exercise Time (min):  9:00 min     Max HR:  142 bpm     Predicted Max HR:  154 bpm  Max Systolic BP: 199 mm Hg     Percent Max HR:  92.21 %     METS: 10.10 Rate Pressure Product:  16109    Stress Test Technologist:  Milana Na EMT-P     Nuclear Technologist:  Burna Mortimer Deal RT-N  Rest Procedure  Myocardial perfusion imaging was performed at rest 4 hours post stress injection by intravenous administration of Thallous Chloride Tl201 , wait 20-30 minutes , and image.  Stress Procedure  The patient exercised for 9:00. The patient stopped due to fatigue  and denied any chest pain.  There were no significant ST-T wave changes and a rare pac.  Myoview was injected at peak exercise and myocardial perfusion imaging was performed after  a brief delay.  QPS Raw Data Images:  Acuisition technically good; normal left ventricular size. Stress Images:  There is normal uptake in all areas. Rest Images:  Normal homogeneous uptake in all areas of the myocardium. Subtraction (SDS):  No evidence of ischemia. Transient Ischemic Dilatation:  .98  (Normal <1.22)  Lung/Heart Ratio:  .31  (Normal <0.45)  Quantitative Gated Spect Images QGS EDV:  72 ml QGS ESV:  28 ml QGS EF:  61 % QGS cine images:  Normal wall motion.   Overall Impression  Exercise Capacity: Good exercise capacity. BP Response: Normal blood pressure response. Clinical Symptoms: There is chest pain ECG Impression: No significant ST segment change suggestive of ischemia. Overall Impression: There is no sign of scar or ischemia.  Appended Document: Cardiology Nuclear Study Negatvie perfusion study.  No further work up planned.  Appended Document: Cardiology Nuclear Study pt aware of results

## 2010-04-22 NOTE — Progress Notes (Signed)
Summary: sore throat  Phone Note Call from Patient   Caller: Patient Call For: Stacie Glaze MD Reason for Call: Talk to Doctor Summary of Call: Pt is complaining of sore throat with cough (dry), and no fever.  Pt is taking is Mucinex and Tylenol. [facility]   [phone] Initial call taken by: Lynann Beaver CMA,  April 09, 2009 8:42 AM  Follow-up for Phone Call        non-productive cough afebrile-started 4 days ago- w ill try atussin and call if that doesnt help-per dr Raynelle Dick call in atuss ds Follow-up by: Willy Eddy, LPN,  April 09, 2009 11:14 AM    New/Updated Medications: ATUSS DS 30-4-30 MG/5ML SUSP (PSEUDOEPHED HCL-CPM-DM HBR TAN) 2 tsp every 12 hours Prescriptions: ATUSS DS 30-4-30 MG/5ML SUSP (PSEUDOEPHED HCL-CPM-DM HBR TAN) 2 tsp every 12 hours  #8oz x 1   Entered by:   Willy Eddy, LPN   Authorized by:   Stacie Glaze MD   Signed by:   Willy Eddy, LPN on 45/40/9811   Method used:   Electronically to        [facility] (retail)       [address]       [place], [place]  [address]       Ph: [phone]       Fax: [phone]   RxID:   [id number]

## 2010-04-22 NOTE — Progress Notes (Signed)
Summary: questions about hearing aids  Phone Note Call from Patient   Caller: Patient Call For: Stacie Glaze MD Summary of Call: Pt. went to Audiology, and had evaluation and purchased hearing aids.  He had now heard and investigated that [facility] will do a free exam , they are much more reasonable with prices on hearing aids.  He has 75 days to take the others back.  Wants to know if  Dr. Lovell Sheehan thinks that he should go to [facility].?  The brands ARE NOT the same. [phone] Initial call taken by: Lynann Beaver CMA,  December 19, 2008 3:21 PM  Follow-up for Phone Call        dr Lovell Sheehan said that is pt's personal  choice Follow-up by: Willy Eddy, LPN,  December 19, 2008 5:05 PM

## 2010-04-22 NOTE — Progress Notes (Signed)
Summary: dosage on prilosec  Phone Note Call from Patient Call back at Home Phone [phone]   Caller: Patient Summary of Call: Pt called and is wanting to know if he needs to take 1 or 2 prilosec a day.  Initial call taken by: Romualdo Bolk, CMA (AAMA),  October 29, 2008 1:01 PM  Follow-up for Phone Call        per med sheet two times a day  Follow-up by: Willy Eddy, LPN,  October 29, 2008 1:05 PM  Additional Follow-up for Phone Call Additional follow up Details #1::        Patient informed. Additional Follow-up by: Darra Lis RMA,  October 29, 2008 1:09 PM    Prescriptions: PRILOSEC 20 MG CPDR (OMEPRAZOLE) one by mouth two times a day with samples  #60 x 3   Entered by:   Darra Lis RMA   Authorized by:   Stacie Glaze MD   Signed by:   Darra Lis RMA on 10/29/2008   Method used:   Electronically to        [facility] (retail)       [address]       [place], [place]  [address]       Ph: [phone]       Fax: [phone]   RxID:   [id number]

## 2010-04-22 NOTE — Assessment & Plan Note (Signed)
Summary: fup on cough-ok per dr Navpreet Szczygiel//ccm   Vital Signs:  Patient profile:   70 year old male Height:      66 inches Weight:      200 pounds BMI:     32.40 Temp:     98.3 degrees F oral Pulse rate:   80 / minute Resp:     14 per minute BP sitting:   134 / 80  (left arm)  Vitals Entered By: Willy Eddy, LPN (April 22, 2009 4:00 PM) CC: pt states he has some sob and dizziness- has been on ceftin since 1-24 and walked 30 minutes yesterday and became dizzy and sob after walking, Hypertension Management   Primary Care Provider:  Darryll Capers, MD  CC:  pt states he has some sob and dizziness- has been on ceftin since 1-24 and walked 30 minutes yesterday and became dizzy and sob after walking and Hypertension Management.  History of Present Illness: increased cough and so failed ceftin for URI/bronchitis xray 10 days ago no infiltrate no signficant rest SOB but if bends over  or walks fast feel milf SOB no fever or chills no blood in cough hx of asthma  andwheezing noted after coughing   Hypertension History:      Positive major cardiovascular risk factors include male age 69 years old or older, hyperlipidemia, and hypertension.  Negative major cardiovascular risk factors include negative family history for ischemic heart disease and non-tobacco-user status.        Positive history for target organ damage include ASHD (either angina/prior MI/prior CABG).     Preventive Screening-Counseling & Management  Alcohol-Tobacco     Smoking Status: quit     Year Quit: 1993     Passive Smoke Exposure: no  Problems Prior to Update: 1)  Pulmonary Congestion  (ICD-786.9) 2)  Adverse Drug Reaction  (ICD-995.20) 3)  Constipation  (ICD-564.00) 4)  Abdominal Pain -generalized  (ICD-789.07) 5)  Loc Osteoarthros Not Spec Prim/sec Pelv Rgn&thi  (ICD-715.35) 6)  Hip Pain, Right  (ICD-719.45) 7)  Tobacco Abuse, Hx of  (ICD-V15.82) 8)  Anemia, Iron Deficiency  (ICD-280.9) 9)  Heel  Pain, Right  (ICD-729.5) 10)  Abdominal Pain, Epigastric  (ICD-789.06) 11)  Medial Epicondylitis, 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) 12)  Preventive Health Care  (ICD-V70.0) 13)  Family History of Cad Male 1st Degree Relative <50  (ICD-V17.3) 14)  Tinnitus, Chronic, 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 Take 1 Tablet By Mouth Q Hs 2)  Benicar Hct 20-12.5 Mg Tabs (Olmesartan Medoxomil-Hctz) .[name].. 1 Once Daily 3)  Lunesta 2 Mg  Tabs (Eszopiclone) .... One By Mouth Q Hs Prn 4)  Clarinex 5 Mg  Tabs (Desloratadine) .... One By Mouth Daily 5)  Bl Adult Aspirin Low Strength 81 Mg  Chew (Aspirin) .... Once Daily 6)  Vitamin B-12 100 Mcg  Tabs (Cyanocobalamin) .... One P.o. Daily Over-The-Counter 7)  Flonase 50 Mcg/act Susp (Fluticasone Propionate) .... 2 Spray Each Nostril Once Daily 8)  Folic Acid 1 Mg  Tabs (Folic Acid) .... Once Daily 9)  Mirapex 0.125 Mg  Tabs (Pramipexole Dihydrochloride) .... One At Bedtime As Needed 10)  Valium 5 Mg  Tabs (Diazepam) .... At Bedtime As Needed 11)  Meloxicam 15 Mg  Tabs (Meloxicam) .... One By Mouth Daily 12)  Prilosec 20 Mg Cpdr (Omeprazole) .... One By Mouth Two Times A Day With Samples 13)  Lac-Hydrin Five 5 % Lotn (Ammonium Lactate) .... Apply To Affected Area Two Times A Day 14)  Qvar 80 Mcg/act Aers (Beclomethasone Dipropionate) .... Two Puffs By Mouth Daily 15)  Proair Hfa 108 (90 Base) Mcg/act  Aers (Albuterol Sulfate) .... Wo Pudd By  Mouth  Q 6 Hour Prn 16)  Align  Caps (Probiotic Product) .... One Tablet By Mouth Once Daily 17)  Healthy Hearing Multivitamin .... Once Daily 18)  Miralax  Powd (Polyethylene Glycol 3350) .... Take 1 Capful Wih 8 Oz of Water or Juice Daily 19)  Benefiber  Powd (Wheat Dextrin) .... Mix Powder in Water. 20)  Hyomax-Sl 0.125 Mg Subl (Hyoscyamine Sulfate) .... Take 1 Tab Every 4 Hours As Needed For Pain 21)  Ciprofloxacin Hcl 500 Mg  Tabs (Ciprofloxacin Hcl) .... Take 1 Twice A Day Times 10 Days 22)  Align  Caps (Probiotic Product) .... One By Mouth Daily 23)  Atuss Ds 30-4-30 Mg/63ml Susp (Pseudoephed Hcl-Cpm-Dm Hbr Tan) .... 2 Tsp Every 12 Hours 24)  Medrol (Pak) 4 Mg Tabs (Methylprednisolone) .... Take As Directed 25)  Ceftin 250 Mg Tabs (Cefuroxime Axetil) .[name].. 1 Two Times A Day For 10 Days  Current Medications (verified): 1)  Crestor 10 Mg Tabs (Rosuvastatin Calcium) .... Take 1 Tablet By Mouth Q Hs 2)  Benicar Hct 20-12.5 Mg Tabs (Olmesartan Medoxomil-Hctz) .[name].. 1 Once Daily 3)  Clarinex 5 Mg  Tabs (Desloratadine) .... One By Mouth Daily 4)  Bl Adult Aspirin Low Strength 81 Mg  Chew (Aspirin) .... Once Daily 5)  Vitamin B-12 100 Mcg  Tabs (Cyanocobalamin) .... One P.o. Daily Over-The-Counter 6)  Flonase 50 Mcg/act Susp (Fluticasone Propionate) .... 2 Spray Each Nostril Once Daily 7)  Folic Acid 1 Mg  Tabs (Folic Acid) .... Once Daily 8)  Mirapex 0.125 Mg  Tabs (Pramipexole Dihydrochloride) .... One At Bedtime As Needed 9)  Valium 5 Mg  Tabs (Diazepam) .... At Bedtime As Needed 10)  Meloxicam 15 Mg  Tabs (Meloxicam) .... One By Mouth Daily 11)  Prilosec 20 Mg Cpdr (Omeprazole) .... One By Mouth Two Times A Day With Samples 12)  Lac-Hydrin Five 5 % Lotn (Ammonium Lactate) .... Apply To Affected Area Two Times A Day 13)  Qvar 80 Mcg/act Aers (Beclomethasone Dipropionate) .... Two Puffs By Mouth Daily 14)  Proair Hfa 108 (90 Base) Mcg/act  Aers (Albuterol Sulfate) .... Wo Pudd By Mouth  Q 6  Hour Prn 15)  Align  Caps (Probiotic Product) .... One Tablet By Mouth Once Daily 16)  Healthy Hearing Multivitamin .... Once Daily 17)  Miralax  Powd (Polyethylene Glycol 3350) .... Take 1 Capful Wih 8 Oz of Water or Juice Daily 18)  Benefiber  Powd (Wheat Dextrin) .... Mix Powder in Water. 19)  Align  Caps (Probiotic Product) .... One By Mouth Daily 20)  Atuss Ds 30-4-30 Mg/44ml Susp (Pseudoephed Hcl-Cpm-Dm Hbr Tan) .... 2 Tsp Every 12 Hours 21)  Clarithromycin 500 Mg Xr24h-Tab (Clarithromycin) .... His Reaction To Erythromicin Is Gi Upset Therfore We Will Procede 1 By Mouth Two Times A Day  Allergies (verified): 1)  ! Vicodin (Hydrocodone-Acetaminophen) 2)  ! Bromfenex 3)  ! Doxycycline Hyclate (Doxycycline Hyclate) 4)  ! Ketek (Telithromycin) 5)  ! Promethazine Hcl (Promethazine Hcl) 6)  ! Provigil (Modafinil) 7)  ! Codeine 8)  Sulfa 9)  Ery-Tab (Erythromycin Base) 10)  Biaxin (Clarithromycin) 11)  Hydrocodone-Acetaminophen (Hydrocodone-Acetaminophen)  Past History:  Family History: Last updated: 02/28/2009 father Family History of CAD Male 1st degree relative <50 mother passed a 80 No FH of Colon Cancer:  Social History: Last updated: 11/06/2008 Married Former Smoker Retired Alcohol Use - yes 2 per week Daily Caffeine Use Illicit Drug Use - no  Risk Factors: Caffeine Use: 5+ (02/21/2007) Exercise: no (02/21/2007)  Risk Factors: Smoking Status: quit (04/22/2009) Passive Smoke Exposure: no (04/22/2009)  Past medical, surgical, family and social histories (including risk factors) reviewed, and no changes noted (except as noted below).  Past Medical History: Reviewed history from 11/06/2008 and no changes required. 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 Thyroidectomy partial Coronary artery bypass graft Cholecystectomy arthroscopy to knee Cervical fusion Cervical laminectomy 05/26/06 athroscopy right knee 04/2008  daldorf  Family History: Reviewed history from 02/28/2009 and no changes required. father Family History of CAD Male 1st degree relative <50 mother passed a 78 No FH of Colon Cancer:  Social History: Reviewed history from 11/06/2008 and no changes required. Married Former Smoker Retired Alcohol Use - yes 2 per week Daily Caffeine Use Illicit Drug Use - no  Review of Systems       The patient complains of hoarseness, peripheral edema, and prolonged cough.  The patient denies anorexia, fever, weight loss, weight gain, vision loss, decreased hearing, chest pain, syncope, headaches, hemoptysis, abdominal pain, melena, hematochezia, severe indigestion/heartburn, hematuria, incontinence, genital sores, muscle weakness, suspicious skin lesions, transient blindness, difficulty walking, depression, unusual weight change, abnormal bleeding, enlarged lymph nodes, angioedema, and breast masses.    Physical Exam  General:  Well-developed,well-nourished,in no acute distress; alert,appropriate and cooperative throughout examination Head:  atraumatic and male-pattern balding.   Eyes:  pupils equal, pupils round, and pupils reactive to light.   Ears:  R ear normal and L ear normal.   Nose:  mucosal erythema and mucosal edema.   Mouth:  pharynx pink and moist.  posterior lymphoid hypertrophy and postnasal drip.   Neck:  No deformities, masses, or tenderness noted. Lungs:  no intercostal retractions.   wheeze after cough detected Heart:  Normal rate and regular rhythm. S1 and S2 normal without gallop, murmur, click, rub or other extra sounds. Abdomen:  no rigidity, distended, guarding, and epigastric tenderness.      Impression & Recommendations:  Problem # 1:   PULMONARY CONGESTION (ICD-786.9) increased cough change to clarithromicin to cover the atypicals  Problem # 2:  ASTHMA UNSPECIFIED WITH EXACERBATION (ICD-493.92)  asthmatic bronchitis change  antibiotic to biaxin for 10 days The following medications were removed from the medication list:    Medrol (pak) 4 Mg Tabs (Methylprednisolone) .[name]... Take as directed His updated medication list for this problem includes:    Qvar 80 Mcg/act Aers (Beclomethasone dipropionate) .[name]..[name] Two puffs by mouth daily    Proair Hfa 108 (90 Base) Mcg/act Aers (Albuterol sulfate) .[name]... Wo pudd by mouth  q 6 hour prn  Current Asthma Management Plan:  Green Zone:  QVAR 80 MCG/ACT  AERS Yellow Zone:  PROAIR HFA 108 (90 BASE) MCG/ACT  AERS:  2 puffs every 4 hours as needed  Orders: Prescription Created Electronically [phone])  Complete Medication List: 1)  Crestor 10 Mg Tabs (Rosuvastatin calcium) .... Take 1 tablet by mouth q hs 2)  Benicar Hct 20-12.5 Mg Tabs (Olmesartan medoxomil-hctz) .[name].. 1 once daily 3)  Clarinex 5 Mg Tabs (Desloratadine) .... One by mouth daily 4)  Bl Adult Aspirin Low Strength 81 Mg Chew (Aspirin) .... Once daily 5)  Vitamin B-12 100 Mcg Tabs (Cyanocobalamin) .... One p.o. daily over-the-counter 6)  Flonase 50 Mcg/act Susp (Fluticasone propionate) .... 2 spray each nostril once daily 7)  Folic Acid 1 Mg Tabs (Folic acid) .... Once daily 8)  Mirapex 0.125 Mg Tabs (Pramipexole dihydrochloride) .... One at bedtime as needed 9)  Valium 5 Mg Tabs (Diazepam) .... At bedtime as needed 10)  Meloxicam 15 Mg Tabs (Meloxicam) .... One by mouth daily 11)  Prilosec 20 Mg Cpdr (Omeprazole) .... One by mouth two times a day with samples 12)  Lac-hydrin Five 5 % Lotn (Ammonium lactate) .... Apply to affected area two times a day 13)  Qvar 80 Mcg/act Aers (Beclomethasone dipropionate) .... Two puffs by mouth daily 14)  Proair Hfa 108 (90 Base) Mcg/act Aers (Albuterol sulfate) .... Wo pudd by mouth  q  6 hour prn 15)  Align Caps (Probiotic product) .... One tablet by mouth once daily 16)  Healthy Hearing Multivitamin  .... Once daily 17)  Miralax Powd (Polyethylene glycol 3350) .... Take 1 capful wih 8 oz of water or juice daily 18)  Benefiber Powd (Wheat dextrin) .... Mix powder in water. 19)  Align Caps (Probiotic product) .... One by mouth daily 20)  Atuss Ds 30-4-30 Mg/63ml Susp (Pseudoephed hcl-cpm-dm hbr tan) .... 2 tsp every 12 hours 21)  Clarithromycin 500 Mg Xr24h-tab (Clarithromycin) .... His reaction to erythromicin is gi upset therfore we will procede 1 by mouth two times a day  Hypertension Assessment/Plan:      The patient's hypertensive risk group is category C: Target organ damage and/or diabetes.  Today's blood pressure is 134/80.  His blood pressure goal is < 140/90.  Patient Instructions: 1)  Take your antibiotic as prescribed until ALL of it is gone, but stop if you develop a rash or swelling and contact our office as soon as possible. Prescriptions: CLARITHROMYCIN 500 MG XR24H-TAB (CLARITHROMYCIN) his reaction to erythromicin is GI upset therfore we will procede 1 by mouth two times a day  #20 x 0   Entered and Authorized by:   Stacie Glaze MD   Signed by:   Stacie Glaze MD on 04/22/2009   Method used:   Electronically to        [facility] (retail)       [address]       [place], [place]  [address]       Ph: [phone]       Fax: [phone]   RxID:   [id number]

## 2010-04-22 NOTE — Letter (Signed)
Summary: [facility]  [facility]   Imported By: Maryln Gottron 02/24/2008 14:33:04  _____________________________________________________________________  External Attachment:    Type:   Image     Comment:   External Document

## 2010-04-22 NOTE — Letter (Signed)
Summary: [facility]  [facility]   Imported By: Maryln Gottron 04/27/2008 14:08:43  _____________________________________________________________________  External Attachment:    Type:   Image     Comment:   External Document

## 2010-04-22 NOTE — Procedures (Signed)
Summary: Endoscopy   EGD  Procedure date:  12/12/2008  Findings:      Location: [facility]   ENDOSCOPY PROCEDURE REPORT  PATIENT:  Granvel, Proudfoot  MR#:  [id number] BIRTHDATE:   1940-08-31, 70 yrs. old   GENDER:   male  ENDOSCOPIST:   Vania Rea. Jarold Motto, MD, [facility] Referred by:   PROCEDURE DATE:  12/12/2008 PROCEDURE:  EGD with biopsy ASA CLASS:   Class II INDICATIONS: iron deficiency anemia, GERD   MEDICATIONS:    Fentanyl 25 mcg IV, Versed 2 mg IV, glycopyrrolate (Robinal) 0.2 mg IV TOPICAL ANESTHETIC:   Exactacain Spray  DESCRIPTION OF PROCEDURE:   After the risks benefits and alternatives of the procedure were thoroughly explained, informed consent was obtained.  The [facility] [id number] endoscope was introduced through the mouth and advanced to the second portion of the duodenum, without limitations.  The instrument was slowly withdrawn as the mucosa was fully examined. <<PROCEDUREIMAGES>>        <<OLD IMAGES>>  A hiatal hernia was found. 2 cm. 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  Normal duodenal folds were noted. small bowel biopsy done.  The stomach was entered and closely examined. The antrum, angularis, and lesser curvature were well visualized, including a retroflexed view of the cardia and fundus. The stomach wall was normally distensable. The scope passed easily through the pylorus into the duodenum.  The esophagus and gastroesophageal junction were completely normal in appearance.    Retroflexed views revealed no abnormalities.    The scope was then withdrawn from the patient and the procedure completed.  COMPLICATIONS:   None  ENDOSCOPIC IMPRESSION:  1) Hiatal hernia  2) Normal duodenal folds  3) Normal stomach  4) Normal esophagus  R/O CELIAC DISEASE.CHRONIC NONEROSIVE GERD.NO BARRETT'S MUCOSA NOTED. RECOMMENDATIONS:  1) await biopsy results  2) continue current meds  REPEAT EXAM:    No   _______________________________ Vania Rea. Jarold Motto, MD, [facility]    CC: Stacie Glaze, MD        REPORT OF SURGICAL PATHOLOGY   Case #: [id number] Patient Name: UPHOFF, Shawndale A. Office Chart Number:  N/A [id number] MRN: [id number] Pathologist: Alden Server A. Delila Spence, MD DOB/Age  24-Dec-1940 (Age: 70)    Gender: M Date Taken:  12/12/2008 Date Received: 12/12/2008   FINAL DIAGNOSIS   ***MICROSCOPIC EXAMINATION AND DIAGNOSIS***   SMALL BOWEL BIOPSY:  BENIGN SMALL BOWEL MUCOSA.  NO VILLOUS ATROPHY, INFLAMMATION OR OTHER ABNORMALITIES PRESENT.   COMMENT There is small bowel mucosa with normal villous architecture and no objective increase in inflammation.  No villous atrophy, active inflammation or other significant changes identified. (EAA:mj 12/13/08)   mj Date Reported:  12/13/2008     Alden Server A. Delila Spence, MD *** Electronically Signed Out By EAA ***       December 13, 2008 MRN: [id number]    [facility] [address] [place], [place]  [address]    Dear Mr. MALTER,  I am pleased to inform you that the biopsies taken during your recent endoscopic examination did not show any evidence of cancer upon pathologic examination.  Additional information/recommendations:  __No further action is needed at this time.  Please follow-up with      your primary care physician for your other healthcare needs.  __ Please call [phone] to schedule a return visit to review      your condition.  _x_ Continue with the treatment plan as outlined on the day of your  exam.  __ You should have a repeat endoscopic examination for this problem              in _ months/years.   Please call us if you are having persistent problems or have questions about your condition that have not been fully answered at this time.  Sincerely,  Mardella Layman MD [facility]  This letter has been electronically signed by your physician.

## 2010-04-22 NOTE — Assessment & Plan Note (Signed)
Summary: M3A/FUP/RCD   Vital Signs:  Patient Profile:   70 Years Old Male Height:     70 inches Weight:      200 pounds Temp:     98.6 degrees F oral Pulse rate:   72 / minute Resp:     14 per minute BP sitting:   110 / 70  (left arm)  Vitals Entered By: Willy Eddy, LPN (December 08, 2007 9:00 AM)                 Chief Complaint:  roa.  History of Present Illness:  Follow-Up Visit      This is a 70 year old man who presents for Follow-up visit.  The patient denies chest pain, palpitations, dizziness, syncope, low blood sugar symptoms, high blood sugar symptoms, edema, SOB, DOE, PND, and orthopnea.  Since the last visit the patient notes no new problems or concerns.  The patient reports monitoring BP.  When questioned about possible medication side effects, the patient notes fatigue.      Current Allergies: ! VICODIN (HYDROCODONE-ACETAMINOPHEN) ! BROMFENEX ! DOXYCYCLINE HYCLATE (DOXYCYCLINE HYCLATE) ! KETEK (TELITHROMYCIN) ! PROMETHAZINE HCL (PROMETHAZINE HCL) ! PROVIGIL (MODAFINIL) SULFA ERY-TAB (ERYTHROMYCIN BASE) BIAXIN (CLARITHROMYCIN) HYDROCODONE-ACETAMINOPHEN (HYDROCODONE-ACETAMINOPHEN)  Past Medical History:    Reviewed history from 02/21/2007 and no changes required:       Asthma       Coronary artery disease       Depression       GERD       Hyperlipidemia       IBS       medial meniscal tear       degerative disc dz       Hypertension  Past Surgical History:    Reviewed history from 10/12/2006 and no changes required:       Thyroidectomy partial       Coronary artery bypass graft       Cholecystectomy       arthroscopy to knee       Cervical fusion       Cervical laminectomy 05/26/06   Family History:    Reviewed history from 06/28/2007 and no changes required:       father       Family History of CAD Male 1st degree relative <50       mother       passed a 63  Social History:    Reviewed history from 06/28/2007 and no changes  required:       Married       Former Smoker       Retired   Risk Factors: Tobacco use:  quit    Year quit:  1993 Passive smoke exposure:  no Drug use:  no HIV high-risk behavior:  no Caffeine use:  5+ drinks per day Alcohol use:  no Exercise:  no  Family History Risk Factors:    Family History of MI in females < 51 years old:  no    Family History of MI in males < 22 years old:  no   Review of Systems  The patient denies anorexia, fever, weight loss, weight gain, vision loss, decreased hearing, hoarseness, chest pain, syncope, dyspnea on exertion, peripheral edema, prolonged cough, headaches, hemoptysis, abdominal pain, melena, hematochezia, severe indigestion/heartburn, hematuria, incontinence, genital sores, muscle weakness, suspicious skin lesions, transient blindness, difficulty walking, depression, unusual weight change, abnormal bleeding, enlarged lymph nodes, angioedema, and breast masses.     Physical Exam  General:     Well-developed,well-nourished,in no acute distress; alert,appropriate and cooperative throughout examination Head:     male-pattern balding.   Ears:     External ear exam shows no significant lesions or deformities.  Otoscopic examination reveals clear canals, tympanic membranes are intact bilaterally without bulging, retraction, inflammation or discharge. Hearing is grossly normal bilaterally. Nose:     nose piercing noted and no external erythema.   Mouth:     pharynx pink and moist.   Neck:     No deformities, masses, or tenderness noted. Lungs:     Normal respiratory effort, chest expands symmetrically. Lungs are clear to auscultation, no crackles or wheezes. Heart:     Normal rate and regular rhythm. S1 and S2 normal without gallop, murmur, click, rub or other extra sounds. Abdomen:     Bowel sounds positive,abdomen soft and non-tender without masses, organomegaly or hernias noted. Msk:     No deformity or scoliosis noted of thoracic or  lumbar spine.   Extremities:     No clubbing, cyanosis, edema, or deformity noted with normal full range of motion of all joints.   Neurologic:     alert & oriented X3, cranial nerves II-XII intact, and strength normal in all extremities.      Impression & Recommendations:  Problem # 1:  PREVENTIVE HEALTH CARE (ICD-V70.0) Flu Vaccine Consent Questions     Do you have a history of severe allergic reactions to this vaccine? no    Any prior history of allergic reactions to egg and/or gelatin? no    Do you have a sensitivity to the preservative Thimersol? no    Do you have a past history of Guillan-Barre Syndrome? no    Do you currently have an acute febrile illness? no    Have you ever had a severe reaction to latex? no    Vaccine information given and explained to patient? yes    Are you currently pregnant? no    Lot Number:[id number]   Site Given  Left Deltoid IM  Reviewed preventive care protocols, scheduled due services, and updated immunizations.   Problem # 2:  HYPERTENSION (ICD-401.9)  His updated medication list for this problem includes:    Benicar Hct 20-12.5 Mg Tabs (Olmesartan medoxomil-hctz) ..... One by mouth daily  BP today: 110/70 Prior BP: 132/80 (08/25/2007)  Prior 10 Yr Risk Heart Disease: N/A (12/14/2006)  Labs Reviewed: Creat: 1.44 (04/28/2007) Chol: 144 (04/28/2007)   HDL: 43 (04/28/2007)   LDL: 74 (04/28/2007)   TG: 137 (04/28/2007)  Orders: Sleep Disorder Referral (Sleep Disorder)   Problem # 3:  SYMPTOM, HYPERSOMNIA W/SLEEP APNEA NOS (ICD-780.53) pr has excessive day time somnolence, snores and has family hs of apnea Orders: Sleep Disorder Referral (Sleep Disorder) CPAP (cm H20):   Mask Type:   Mask Size:   not on mask at present  Problem # 4:  CORONARY ARTERY DISEASE (ICD-414.00)  His updated medication list for this problem includes:    Benicar Hct 20-12.5 Mg Tabs (Olmesartan medoxomil-hctz) ..... One by mouth daily    Bl Adult Aspirin Low  Strength 81 Mg Chew (Aspirin) ..... Once daily  Orders: Sleep Disorder Referral (Sleep Disorder)  Labs Reviewed: Chol: 144 (04/28/2007)   HDL: 43 (04/28/2007)   LDL: 74 (04/28/2007)   TG: 137 (04/28/2007)   Complete Medication List: 1)  Crestor 10 Mg Tabs (Rosuvastatin calcium) .... Take 1 tablet by mouth q hs 2)  Benicar Hct 20-12.5 Mg Tabs (Olmesartan medoxomil-hctz) .... One by  mouth daily 3)  Pulmicort Flexhaler 90 Mcg/act Inha (Budesonide (inhalation)) .... One puff p.o. bid 4)  Lunesta 2 Mg Tabs (Eszopiclone) .... One by mouth q hs prn 5)  Clarinex 5 Mg Tabs (Desloratadine) .... One by mouth daily 6)  Bl Adult Aspirin Low Strength 81 Mg Chew (Aspirin) .... Once daily 7)  Vitamin B-12 100 Mcg Tabs (Cyanocobalamin) .... One p.o. daily over-the-counter 8)  Nasonex 50 Mcg/act Susp (Mometasone furoate) .... Two sprays q nostril daily 9)  Folic Acid 1 Mg Tabs (Folic acid) .... Once daily 10)  Mirapex 0.125 Mg Tabs (Pramipexole dihydrochloride) .... One at bedtime as needed 11)  Valium 5 Mg Tabs (Diazepam) .... At bedtime as needed 12)  Meloxicam 15 Mg Tabs (Meloxicam) .... One by mouth daily 13)  Prilosec 40 Mg Cpdr (Omeprazole) .... Once daily as needed  Other Orders: Flu Vaccine 17yrs + (29528) Admin of Therapeutic Inj (IM or Cody) (41324)   Patient Instructions: 1)  Please schedule a follow-up appointment in 5-6 weeks.   ]

## 2010-04-22 NOTE — Assessment & Plan Note (Signed)
Summary: rov w/pft's/apc   Visit Type:  Follow-up Copy to:  Dr. Darryll Capers Primary Provider/Referring Provider:  Darryll Capers, MD  CC:  Pt here for follow-up to discuss pft. Pt states SOB and congestion is gone.  [name]  History of Present Illness: OV 06/06/2009: 70 year old asthmatic. Follows up for issues of asthenia, thrush, and atypical chest pain sustained after flu like illness in Jan 2011. Since last visit on 05/01/2009 he feels completely well, astymptomatic and back to baseline health. He saw Dr. Jens Som for chest pain. Had nuclear medicine stress test 05/14/2009 which was normal and placed him at low risk for MI. He cntinues with qvar 1 puff two times a day of the dose. At this regimen he never uses albuterol for resuce and feels baseline. His PFTs today are essentially normal (done while on qvar) except for air trapping as evidenced by high Residual volume (2.74L/121%) and mild reduction in DLCO (74%). He admits that if he comes off QVAR he will have nocturnal wheeze. No other complaints.   Current Medications (verified): 1)  Crestor 10 Mg Tabs (Rosuvastatin Calcium) .... Take 1 Tablet By Mouth Q Hs 2)  Benicar Hct 20-12.5 Mg Tabs (Olmesartan Medoxomil-Hctz) .[name].. 1 Once Daily 3)  Clarinex 5 Mg  Tabs (Desloratadine) .... One By Mouth Daily 4)  Bl Adult Aspirin Low Strength 81 Mg  Chew (Aspirin) .... Once Daily 5)  Vitamin B-12 100 Mcg  Tabs (Cyanocobalamin) .... One P.o. Daily Over-The-Counter 6)  Flonase 50 Mcg/act Susp (Fluticasone Propionate) .... 2 Spray Each Nostril Once Daily 7)  Folic Acid 1 Mg  Tabs (Folic Acid) .... Once Daily 8)  Mirapex 0.125 Mg  Tabs (Pramipexole Dihydrochloride) .... One At Bedtime As Needed 9)  Valium 5 Mg  Tabs (Diazepam) .... At Bedtime As Needed 10)  Meloxicam 15 Mg  Tabs (Meloxicam) .... One By Mouth Daily 11)  Prilosec 20 Mg Cpdr (Omeprazole) .... One By Mouth Two Times A Day With Samples 12)  Lac-Hydrin Five 5 % Lotn (Ammonium Lactate) .... Apply  To Affected Area Two Times A Day 13)  Qvar 80 Mcg/act Aers (Beclomethasone Dipropionate) .... Two Puffs By Mouth Daily 14)  Proair Hfa 108 (90 Base) Mcg/act  Aers (Albuterol Sulfate) .... Wo Pudd By Mouth  Q 6 Hour Prn 15)  Align  Caps (Probiotic Product) .... One Tablet By Mouth Once Daily 16)  Healthy Hearing Multivitamin .... Take Two Twice Daily 17)  Benefiber  Powd (Wheat Dextrin) .... Mix Powder in Water. 18)  Nitrostat 0.4 Mg Subl (Nitroglycerin) .[name].. 1 Tablet Under Tongue At Onset of Chest Pain; You May Repeat Every 5 Minutes For Up To 3 Doses.  Allergies (verified): 1)  ! Vicodin (Hydrocodone-Acetaminophen) 2)  ! Bromfenex 3)  ! Doxycycline Hyclate (Doxycycline Hyclate) 4)  ! Ketek (Telithromycin) 5)  ! Promethazine Hcl (Promethazine Hcl) 6)  ! Provigil (Modafinil) 7)  ! Codeine 8)  Sulfa 9)  Ery-Tab (Erythromycin Base) 10)  Biaxin (Clarithromycin) 11)  Hydrocodone-Acetaminophen (Hydrocodone-Acetaminophen)  Past History:  Family History: Last updated: 02/28/2009 father Family History of CAD Male 1st degree relative <50 mother passed a 61 No FH of Colon Cancer:  Social History: Last updated: 05/01/2009 Married Former Smoker, quit in 1994 x 35 years avg. >1 ppd Retired Alcohol Use - yes 2 per week Daily Caffeine Use Illicit Drug Use - no Exercise regular  Risk Factors: Caffeine Use: 5+ (02/21/2007) Exercise: no (02/21/2007)  Risk Factors: Smoking Status: quit (04/22/2009) Passive Smoke Exposure:  no (04/22/2009)  Past Medical History: Reviewed history from 05/01/2009 and no changes required. Asthma......Marland KitchenDr Charna Archer > dxed 2002. On QVAR since then. REcollects PFTs/spiro but that was normal Coronary artery disease.........Marland KitchenDr Antoine Poche > S/p CABG 2001, Normal perfusion study 2006. Last seen cards Nov 2009 Depression GERD Hyperlipidemia IBS medial meniscal tear degerative disc  dz Hypertension Anemia Arthritis Diverticulosis Obesity Pancreatitis Peptic Ulcer Disease Pneumonia Sleep Apnea >Mild on Sleep study Nov 2009, AHI Nov 2009  Past Surgical History: Reviewed history from 06/08/2008 and no changes required. Thyroidectomy partial Coronary artery bypass graft Cholecystectomy arthroscopy to knee Cervical fusion Cervical laminectomy 05/26/06 athroscopy right knee 04/2008  daldorf  Family History: Reviewed history from 02/28/2009 and no changes required. father Family History of CAD Male 1st degree relative <50 mother passed a 2 No FH of Colon Cancer:  Social History: Reviewed history from 05/01/2009 and no changes required. Married Former Smoker, quit in 1994 x 35 years avg. >1 ppd Retired Alcohol Use - yes 2 per week Daily Caffeine Use Illicit Drug Use - no Exercise regular  Review of Systems  The patient denies shortness of breath with activity, shortness of breath at rest, productive cough, non-productive cough, coughing up blood, chest pain, irregular heartbeats, acid heartburn, indigestion, loss of appetite, weight change, abdominal pain, difficulty swallowing, sore throat, tooth/dental problems, headaches, nasal congestion/difficulty breathing through nose, sneezing, itching, ear ache, anxiety, depression, hand/feet swelling, joint stiffness or pain, rash, change in color of mucus, and fever.    Vital Signs:  Patient profile:   69 year old male Height:      67 inches Weight:      201 pounds O2 Sat:      97 % on Room air Temp:     97.9 degrees F oral Pulse rate:   84 / minute BP sitting:   120 / 70  (right arm) Cuff size:   regular  Vitals Entered By: Carron Curie CMA (June 06, 2009 3:59 PM)  O2 Flow:  Room air  Physical Exam  General:  well developed, well nourished, in no acute distress. obese.   Head:  normocephalic and atraumatic Eyes:  PERRLA/EOM intact; conjunctiva and sclera clear Ears:  TMs intact and clear  with normal canals Nose:  no deformity, discharge, inflammation, or lesions Mouth:  no deformity or lesions. Thrush resolved Neck:  no masses, thyromegaly, or abnormal cervical nodes scar in neck + Chest Wall:  no deformities noted cabg scar + Lungs:  clear bilaterally to auscultation and percussion Heart:  regular rate and rhythm, S1, S2 without murmurs, rubs, gallops, or clicks Abdomen:  bowel sounds positive; abdomen soft and non-tender without masses, or organomegaly Msk:  no deformity or scoliosis noted with normal posture Pulses:  pulses normal Extremities:  no clubbing, cyanosis, edema, or deformity noted Neurologic:  CN II-XII grossly intact with normal reflexes, coordination, muscle strength and tone Skin:  intact without lesions or rashes Cervical Nodes:  no significant adenopathy Axillary Nodes:  no significant adenopathy Psych:  alert and cooperative; normal mood and affect; normal attention span and concentration   EKG  Procedure date:  06/06/2009  Findings:      PFTs indepoendently revieed. Normal excetp for RV 121% and DLCO 74%  Impression & Recommendations:  Problem # 1:  ASTHENIA (ICD-780.79) Assessment Improved  resolved  plan reassure  Orders: Est. Patient Level III ([address])  Problem # 2:  THRUSH (ICD-112.0) Assessment: Improved  resolved  Orders: Est. Patient Level III (60454)  Problem # 3:  CHEST PAIN-UNSPECIFIED (ICD-786.50) Assessment: Improved resolved. Probably post viral. Myoview feb 2011 is negative  plan reassure  Problem # 4:  ASTHMA (ICD-493.90) Assessment: Unchanged Well controlled on QVAR. Clinical hx of wheeze when off qvar and high RV on PFTs that are otherwise normal while on qvar suggest he does have asthma.   plan continue qvar at 1 puff two times a day of the dose rov 1 year  Patient Instructions: 1)  glad you are well 2)  continue qvar 3)  see you in a year 4)  call or come sooner for problems in  between 5)  can you stop your meloxicam or cut it down? - I just realized that you are on it. Stopping it might help asthma control

## 2010-04-22 NOTE — Assessment & Plan Note (Signed)
Summary: 3 MONTH ROV/NJR/PT RSC FROM BMP/CJR   Vital Signs:  Patient profile:   70 year old male Height:      67 inches Weight:      199 pounds BMI:     31.28 Temp:     98.2 degrees F oral Pulse rate:   72 / minute Resp:     14 per minute BP sitting:   130 / 80  (left arm)  Vitals Entered By: Willy Eddy, LPN (January 06, 2010 4:21 PM)  Nutrition Counseling: Patient's BMI is greater than 25 and therefore counseled on weight management options. CC: roa- c/o sore throat Is Patient Diabetic? No   Primary Care Nyaisha Simao:  Darryll Capers, MD  CC:  roa- c/o sore throat.  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) 2)  Back Pain  (ICD-724.5) 3)  Gastroenteritis  (ICD-558.9) 4)  Fatigue  (ICD-780.79) 5)  Asthma  (ICD-493.90) 6)  Thrush  (ICD-112.0) 7)  Shortness of Breath (SOB)  (ICD-786.05) 8)  Chest Pain-unspecified  (ICD-786.50) 9)  Asthenia  (ICD-780.79) 10)  Asthma Unspecified With Exacerbation  (ICD-493.92) 11)  Pulmonary Congestion  (ICD-786.9) 12)  Adverse Drug Reaction  (ICD-995.20) 13)  Constipation  (ICD-564.00) 14)  Abdominal Pain -generalized  (ICD-789.07) 15)  Loc Osteoarthros Not Spec Prim/sec Pelv Rgn&thi  (ICD-715.35) 16)  Hip Pain, Right  (ICD-719.45) 17)  Tobacco Abuse, Hx of  (ICD-V15.82) 18)  Anemia, Iron Deficiency  (ICD-280.9) 19)  Heel Pain, Right  (ICD-729.5) 20)  Abdominal Pain, Epigastric  (ICD-789.06) 21)  Medial Epicondylitis, 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) 22)  Preventive Health Care  (ICD-V70.0) 23)   Family History of Cad Male 1st Degree Relative <50  (ICD-V17.3) 24)  Tinnitus, Chronic, 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 Take 1 Tablet By Mouth Q Hs 2)  Benicar Hct 20-12.5 Mg Tabs (Olmesartan Medoxomil-Hctz) .[name].. 1 Once Daily 3)  Clarinex 5 Mg  Tabs (Desloratadine) .... One By Mouth Daily 4)  Bl Adult Aspirin Low Strength 81 Mg  Chew (Aspirin) .... Once Daily 5)  Vitamin B-12 100 Mcg  Tabs (Cyanocobalamin) .... One P.o. Daily Over-The-Counter 6)  Flonase 50 Mcg/act Susp (Fluticasone Propionate) .... 2 Spray Each Nostril Once Daily 7)  Folic Acid 1 Mg  Tabs (Folic Acid) .... Once Daily 8)  Mirapex 0.125 Mg  Tabs (Pramipexole Dihydrochloride) .... One At Bedtime As Needed 9)  Valium 5 Mg  Tabs (Diazepam) .... At Bedtime As Needed 10)  Prilosec 20 Mg Cpdr (Omeprazole) .... One By  Mouth Two Times A Day With Samples 11)  Lac-Hydrin Five 5 % Lotn (Ammonium Lactate) .... Apply To Affected Area Two Times A Day As Needed 12)  Qvar 80 Mcg/act Aers (Beclomethasone Dipropionate) .... Two Puffs By Mouth Daily 13)  Proair Hfa 108 (90 Base) Mcg/act  Aers (Albuterol Sulfate) .... Wo Pudd By Mouth  Q 6 Hour Prn 14)  Benefiber  Powd (Wheat Dextrin) .... Mix Powder in Water. 15)  Nitrostat 0.4 Mg Subl (Nitroglycerin) .[name].. 1 Tablet Under Tongue At Onset of Chest Pain; You May Repeat Every 5 Minutes For Up To 3 Doses. 16)  Miralax  Powd (Polyethylene Glycol 3350) ....  Daily 17)  Align  Caps (Probiotic Product) .[name].. 1 By Mouth Daily 18)  Tramadol Hcl 50 Mg Tabs (Tramadol Hcl) .... One By Mouth Q 6 Hours As Needed Pain Noted Prior Reaction To Codine  Allergies (verified): 1)  ! Vicodin (Hydrocodone-Acetaminophen) 2)  ! Bromfenex 3)  ! Doxycycline Hyclate (Doxycycline Hyclate) 4)  ! Ketek (Telithromycin) 5)  ! Promethazine Hcl (Promethazine Hcl) 6)  ! Provigil (Modafinil) 7)  ! Codeine 8)  Sulfa 9)  Ery-Tab (Erythromycin Base) 10)  Biaxin (Clarithromycin) 11)  Hydrocodone-Acetaminophen (Hydrocodone-Acetaminophen)   Impression & Recommendations:  Problem # 1:  SINUSITIS, CHRONIC NOS (ICD-473.9)  His updated medication list for this problem includes:    Flonase 50 Mcg/act Susp (Fluticasone propionate) .[name]... 2 spray each nostril once daily    Clarithromycin 500 Mg Xr24h-tab (Clarithromycin) ..... One by mouth two times a day  Take antibiotics for full duration. Discussed treatment options including indications for coronal CT scan of sinuses and ENT referral.   Problem # 2:  ACUTE FRONTAL SINUSITIS (ICD-461.1)  His updated medication list for this problem includes:    Flonase 50 Mcg/act Susp (Fluticasone propionate) .[name]... 2 spray each nostril once daily    Clarithromycin 500 Mg Xr24h-tab (Clarithromycin) ..... One by mouth two times a day  Instructed on treatment. Call if symptoms persist or worsen.   Complete Medication List: 1)  Crestor 10 Mg Tabs (Rosuvastatin calcium) .... Take 1 tablet by mouth q hs 2)  Benicar Hct 20-12.5 Mg Tabs (Olmesartan medoxomil-hctz) .[name].. 1 once daily 3)  Clarinex 5 Mg Tabs (Desloratadine) .... One by mouth daily 4)  Bl Adult Aspirin Low Strength 81 Mg Chew (Aspirin) .... Once daily 5)  Vitamin B-12 100 Mcg Tabs (Cyanocobalamin) .... One p.o. daily over-the-counter 6)  Flonase 50 Mcg/act Susp (Fluticasone propionate) .... 2 spray each nostril once daily 7)  Folic Acid 1 Mg Tabs (Folic acid) .... Once daily 8)   Mirapex 0.125 Mg Tabs (Pramipexole dihydrochloride) .... One at bedtime as needed 9)  Valium 5 Mg Tabs (Diazepam) .... At bedtime as needed 10)  Prilosec 20 Mg Cpdr (Omeprazole) .... One by mouth two times a day with samples 11)  Lac-hydrin Five 5 % Lotn (Ammonium lactate) .... Apply to affected area two times a day as needed 12)  Qvar 80 Mcg/act Aers (Beclomethasone dipropionate) .... Two puffs by mouth daily 13)  Proair Hfa 108 (90 Base) Mcg/act Aers (Albuterol sulfate) .... Wo pudd by mouth  q 6 hour prn 14)  Benefiber Powd (Wheat dextrin) .... Mix powder in water. 15)  Nitrostat 0.4 Mg Subl (Nitroglycerin) .[name].. 1 tablet under tongue at onset of chest pain; you may repeat every 5 minutes for up to 3 doses. 16)  Miralax Powd (Polyethylene glycol 3350) .... Daily 17)  Align Caps (Probiotic product) .[name].. 1 by mouth daily  18)  Tramadol Hcl 50 Mg Tabs (Tramadol hcl) .... One by mouth q 6 hours as needed pain noted prior reaction to codine 19)  Clarithromycin 500 Mg Xr24h-tab (Clarithromycin) .... One by mouth two times a day  Patient Instructions: 1)  Please schedule a follow-up appointment in 3 months. Prescriptions: CLARITHROMYCIN 500 MG XR24H-TAB (CLARITHROMYCIN) one by mouth two times a day  #14 x 0   Entered and Authorized by:   Stacie Glaze MD   Signed by:   Stacie Glaze MD on 01/06/2010   Method used:   Electronically to        [facility] (retail)       [address]       [place], [place]  [address]       Ph: [phone]       Fax: [phone]   RxID:   [id number]

## 2010-04-22 NOTE — Assessment & Plan Note (Signed)
Summary: 1 month rov/njr   Vital Signs:  Patient profile:   70 year old male Height:      67 inches Weight:      194 pounds BMI:     30.49 Temp:     98.2 degrees F oral Pulse rate:   76 / minute Pulse rhythm:   regular Resp:     14 per minute BP sitting:   146 / 94  (left arm)  Vitals Entered By: Willy Eddy, LPN (Aug 06, 2009 12:19 PM) CC: rf/u after seeing dr Kirtland Bouchard for fatigue - feeling better now but c/o elevated bp, Hypertension Management   Primary Care Provider:  Darryll Capers, MD  CC:  rf/u after seeing dr Kirtland Bouchard for fatigue - feeling better now but c/o elevated bp and Hypertension Management.  History of Present Illness: Pt has back pain and was seen by dr Kirtland Bouchard with good theraputic results When he was ill ( due to hypotension) the HCTZ was removed due to hypotension he has experienced fatigue and excessive sleeping that is just not improved has not limited salt before the URI  he was exercizing and weight loss  Hypertension History:      He denies headache, chest pain, palpitations, dyspnea with exertion, orthopnea, PND, peripheral edema, visual symptoms, neurologic problems, syncope, and side effects from treatment.        Positive major cardiovascular risk factors include male age 48 years old or older, hyperlipidemia, and hypertension.  Negative major cardiovascular risk factors include negative family history for ischemic heart disease and non-tobacco-user status.        Positive history for target organ damage include ASHD (either angina/prior MI/prior CABG).     Preventive Screening-Counseling & Management  Alcohol-Tobacco     Smoking Status: never     Year Quit: 1993     Passive Smoke Exposure: no  Current Problems (verified): 1)  Back Pain  (ICD-724.5) 2)  Gastroenteritis  (ICD-558.9) 3)  Fatigue  (ICD-780.79) 4)  Asthma  (ICD-493.90) 5)  Thrush  (ICD-112.0) 6)  Shortness of Breath (SOB)  (ICD-786.05) 7)  Chest Pain-unspecified  (ICD-786.50) 8)  Asthenia   (ICD-780.79) 9)  Asthma Unspecified With Exacerbation  (ICD-493.92) 10)  Pulmonary Congestion  (ICD-786.9) 11)  Adverse Drug Reaction  (ICD-995.20) 12)  Constipation  (ICD-564.00) 13)  Abdominal Pain -generalized  (ICD-789.07) 14)  Loc Osteoarthros Not Spec Prim/sec Pelv Rgn&thi  (ICD-715.35) 15)  Hip Pain, Right  (ICD-719.45) 16)  Tobacco Abuse, Hx of  (ICD-V15.82) 17)  Anemia, Iron Deficiency  (ICD-280.9) 18)  Heel Pain, Right  (ICD-729.5) 19)  Abdominal Pain, Epigastric  (ICD-789.06) 20)  Medial Epicondylitis, 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) 21)  Preventive Health Care  (ICD-V70.0) 22)  Family History of Cad Male 1st Degree Relative <50  (ICD-V17.3) 23)  Tinnitus, Chronic, 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 Take 1 Tablet By Mouth Q Hs 2)  Benicar Hct 20-12.5 Mg Tabs (Olmesartan Medoxomil-Hctz) .[name].. 1 Once Daily 3)  Clarinex  5 Mg  Tabs (Desloratadine) .... One By Mouth Daily 4)  Bl Adult Aspirin Low Strength 81 Mg  Chew (Aspirin) .... Once Daily 5)  Vitamin B-12 100 Mcg  Tabs (Cyanocobalamin) .... One P.o. Daily Over-The-Counter 6)  Flonase 50 Mcg/act Susp (Fluticasone Propionate) .... 2 Spray Each Nostril Once Daily 7)  Folic Acid 1 Mg  Tabs (Folic Acid) .... Once Daily 8)  Mirapex 0.125 Mg  Tabs (Pramipexole Dihydrochloride) .... One At Bedtime As Needed 9)  Valium 5 Mg  Tabs (Diazepam) .... At Bedtime As Needed 10)  Prilosec 20 Mg Cpdr (Omeprazole) ....  One By Mouth Two Times A Day With Samples 11)  Lac-Hydrin Five 5 % Lotn (Ammonium Lactate) .... Apply To Affected Area Two Times A Day As Needed 12)  Qvar 80 Mcg/act Aers (Beclomethasone Dipropionate) .... Two Puffs By Mouth Daily 13)  Proair Hfa 108 (90 Base) Mcg/act  Aers (Albuterol Sulfate) .... Wo Pudd By Mouth  Q 6 Hour Prn 14)  Benefiber  Powd (Wheat Dextrin) .... Mix Powder in Water. 15)  Nitrostat 0.4 Mg Subl (Nitroglycerin) .[name].. 1 Tablet Under Tongue At Onset of Chest Pain; You May Repeat Every 5 Minutes For Up To 3 Doses. 16)  Miralax  Powd (Polyethylene Glycol 3350) .... Daily 17)  Align  Caps (Probiotic Product) .[name].. 1 By Mouth Daily 18)  Tramadol Hcl 50 Mg Tabs (Tramadol Hcl) .... One By Mouth Q 6 Hours As Needed Pain Noted Prior Reaction To Codine 19)  Promethazine Hcl 25 Mg Tabs (Promethazine Hcl) .... One Every 6 Hours As Needed For Nausea  Allergies (verified): 1)  ! Vicodin (Hydrocodone-Acetaminophen) 2)  ! Bromfenex 3)  ! Doxycycline Hyclate (Doxycycline Hyclate) 4)  ! Ketek (Telithromycin) 5)  ! Promethazine Hcl (Promethazine Hcl) 6)  ! Provigil (Modafinil) 7)  ! Codeine 8)  Sulfa 9)  Ery-Tab (Erythromycin Base) 10)  Biaxin (Clarithromycin) 11)  Hydrocodone-Acetaminophen (Hydrocodone-Acetaminophen)  Past History:  Family History: Last updated: 02/28/2009 father Family History of CAD Male 1st degree relative <50 mother passed a 26 No FH of Colon Cancer:  Social History: Last updated: 05/01/2009 Married Former Smoker, quit in 1994 x 35 years avg. >1 ppd Retired Alcohol Use - yes 2 per week Daily Caffeine Use Illicit Drug Use - no Exercise regular  Risk Factors: Caffeine Use: 5+ (02/21/2007) Exercise: no (02/21/2007)  Risk Factors: Smoking Status: never (08/06/2009) Passive Smoke Exposure: no (08/06/2009)  Past medical, surgical, family and social histories (including risk factors) reviewed for relevance to current acute and chronic  problems.  Past Medical History: Reviewed history from 06/10/2009 and no changes required. Asthma......Marland KitchenDr Charna Archer > dxed 2002. On QVAR since then. 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 Thyroidectomy partial Coronary artery bypass graft Cholecystectomy Cervical fusion Cervical laminectomy 05/26/06 Athroscopy right knee 04/2008  daldorf  Family History: Reviewed history from 02/28/2009 and no changes required. father Family History of CAD Male 1st degree relative <50 mother passed a 14 No FH of Colon Cancer:  Social History: Reviewed history from 05/01/2009 and no changes required. Married Former Smoker, quit in 1994 x 35 years avg. >1 ppd Retired Alcohol Use - yes 2 per week Daily Caffeine Use  Illicit Drug Use - no Exercise regular  Review of Systems  The patient denies anorexia, fever, weight loss, weight gain, vision loss, decreased hearing, hoarseness, chest pain, syncope, dyspnea on exertion, peripheral edema, prolonged cough, headaches, hemoptysis, abdominal pain, melena, hematochezia, severe indigestion/heartburn, hematuria, incontinence, genital sores, muscle weakness, suspicious skin lesions, transient blindness, difficulty walking, depression, unusual weight change, abnormal bleeding, enlarged lymph nodes, angioedema, breast masses, and testicular masses.    Physical Exam  General:  overweight-appearing.  uncomfortable but in no acute distress.  Blood pressure normal.  Afebrileoverweight-appearing.   Head:  Normocephalic and atraumatic without obvious  abnormalities. No apparent alopecia or balding. Eyes:  anicteric Ears:  hearing  aids removed.  Tympanic membranes and canals clear Nose:  mucosal erythema and mucosal edema.   Mouth:  Oral mucosa and oropharynx without lesions or exudates.  Teeth in good repair. appeared well-hydrated Neck:  No deformities, masses, or tenderness noted. Lungs:  Normal respiratory effort, chest expands symmetrically. Lungs are clear to auscultation, no crackles or wheezes. O2 saturation 98 Heart:  Normal rate and regular rhythm. S1 and S2 normal without gallop, murmur, click, rub or other extra sounds. pulse rate 98 Abdomen:  obese soft and nontender.  No distention, guarding, or rebound.  Bowel sounds normal   Impression & Recommendations:  Problem # 1:  HYPERTENSION (ICD-401.9)  His updated medication list for this problem includes:    Benicar Hct 20-12.5 Mg Tabs (Olmesartan medoxomil-hctz) .[name]... 1 once daily  BP today: 146/94 Prior BP: 120/80 (07/15/2009)  Prior 10 Yr Risk Heart Disease: N/A (12/14/2006)  Labs Reviewed: K+: 3.8 (07/09/2009) Creat: : 1.8 (07/09/2009)   Chol: 152 (07/04/2009)   HDL: 44.20 (07/04/2009)   LDL: 56 (10/12/2008)   TG: 113.0 (10/12/2008)  Problem # 2:  ANEMIA, IRON DEFICIENCY (ICD-280.9)  His updated medication list for this problem includes:    Vitamin B-12 100 Mcg Tabs (Cyanocobalamin) ..... One p.o. daily over-the-counter    Folic Acid 1 Mg Tabs (Folic acid) ..... Once daily  Hgb: 15.9 (07/09/2009)   Hct: 45.8 (07/09/2009)   Platelets: 214.0 (07/09/2009) RBC: 5.07 (07/09/2009)   RDW: 12.5 (07/09/2009)   WBC: 6.7 (07/09/2009) MCV: 90.3 (07/09/2009)   MCHC: 34.8 (07/09/2009) Ferritin: 27.8 (11/06/2008) Iron: 54 (11/06/2008)   % Sat: 15.7 (11/06/2008) B12: >1500 pg/mL (11/06/2008)   Folate: >20.0 ng/mL (11/06/2008)   TSH: 1.14 (11/06/2008)  Problem # 3:  ASTHMA (ICD-493.90)  The following medications were removed from the medication list:    Prednisone 20 Mg  Tabs (Prednisone) ..... One twice daily for 7 to 10 days His updated medication list for this problem includes:    Qvar 80 Mcg/act Aers (Beclomethasone dipropionate) .[name]..[name] Two puffs by mouth daily    Proair Hfa 108 (90 Base) Mcg/act Aers (Albuterol sulfate) .[name]... Wo pudd by mouth  q 6 hour prn  Pulmonary Functions Reviewed: O2 sat: 97 (06/06/2009)  Current Asthma Management Plan:  Green Zone:  QVAR 80 MCG/ACT AERS Yellow Zone:  PROAIR HFA 108 (90 BASE) MCG/ACT  AERS:  2 puffs every 4 hours as needed  Complete Medication List: 1)  Crestor 10 Mg Tabs (Rosuvastatin calcium) .... Take 1 tablet by mouth q hs 2)  Benicar Hct 20-12.5 Mg Tabs (Olmesartan medoxomil-hctz) .[name].. 1 once daily 3)  Clarinex 5 Mg Tabs (Desloratadine) .... One by mouth daily 4)  Bl Adult Aspirin Low Strength 81 Mg Chew (Aspirin) .... Once daily 5)  Vitamin B-12 100 Mcg Tabs (Cyanocobalamin) .... One p.o. daily over-the-counter 6)  Flonase 50 Mcg/act Susp (Fluticasone propionate) .... 2 spray each nostril once daily 7)  Folic Acid 1 Mg Tabs (Folic acid) .... Once daily 8)  Mirapex 0.125 Mg Tabs (Pramipexole dihydrochloride) .... One at bedtime as needed 9)  Valium 5 Mg Tabs (Diazepam) .... At bedtime as needed 10)  Prilosec 20 Mg Cpdr (Omeprazole) .... One by mouth two times a day with samples 11)  Lac-hydrin Five 5 % Lotn (Ammonium lactate) .... Apply to affected area two times a day as needed 12)  Qvar 80 Mcg/act Aers (Beclomethasone dipropionate) .... Two puffs by mouth daily 13)  Proair Hfa 108 (90 Base) Mcg/act Aers (Albuterol sulfate) .... Wo pudd by mouth  q 6 hour prn 14)  Benefiber Powd (Wheat dextrin) .... Mix powder in water. 15)  Nitrostat 0.4 Mg Subl (Nitroglycerin) .[name].. 1 tablet under tongue at onset of chest pain; you may repeat every 5 minutes for up to 3 doses. 16)  Miralax Powd (Polyethylene glycol 3350) .... Daily 17)  Align Caps (Probiotic product) .[name].. 1 by mouth daily 18)  Tramadol Hcl 50 Mg  Tabs (Tramadol hcl) .... One by mouth q 6 hours as needed pain noted prior reaction to codine 19)  Promethazine Hcl 25 Mg Tabs (Promethazine hcl) .... One every 6 hours as needed for nausea  Hypertension Assessment/Plan:      The patient's hypertensive risk group is category C: Target organ damage and/or diabetes.  Today's blood pressure is 146/94.  His blood pressure goal is < 140/90.  Patient Instructions: 1)  Please schedule a follow-up appointment in 3weeks. 2)  record BP one a day alternate morning and evening and cut back on salt and resume exercize

## 2010-04-22 NOTE — Progress Notes (Signed)
Summary: New RX request, changing insurance  Phone Note Call from Patient Call back at [facility] Phone [phone]   Caller: Patient Call For: Lovell Sheehan Summary of Call: Pt calling to request a new Rx for Benicar HCT 20-12.5 be called into [facility].  Pt changing insurance and pharmacy. Initial call taken by: Sid Falcon LPN,  June 21, 2007 9:18 AM      Prescriptions: BENICAR HCT 20-12.5 MG  TABS (OLMESARTAN MEDOXOMIL-HCTZ) one by mouth daily  #30 x 11   Entered by:   Willy Eddy, LPN   Authorized by:   Stacie Glaze MD   Signed by:   Willy Eddy, LPN on 44/81/8563   Method used:   Electronically sent to ...       [facility]       [address]       [place], [place]  [address]       Ph: [phone]       Fax: [phone]   RxID:   [id number]

## 2010-04-22 NOTE — Progress Notes (Signed)
Summary: Med Refill  Phone Note Call from Patient Call back at Home Phone [phone]   Caller: Patient Reason for Call: Refill Medication Summary of Call: Patient needs a refill - 90 day supply of Omeprazole.  Please call into [facility] Initial call taken by: Everrett Coombe,  June 24, 2009 8:45 AM    Prescriptions: PRILOSEC 20 MG CPDR (OMEPRAZOLE) one by mouth two times a day with samples  #180 x 3   Entered by:   Willy Eddy, LPN   Authorized by:   Stacie Glaze MD   Signed by:   Willy Eddy, LPN on 47/42/5956   Method used:   Electronically to        [facility] (retail)       [address]       [place], [place]  [address]       Ph: [phone]       Fax: [phone]   RxID:   [id number]

## 2010-04-22 NOTE — Progress Notes (Signed)
Summary: xray results  Phone Note Call from Patient Call back at Home Phone [phone] Call back at Work Phone [phone]   Caller: vm Call For: jenkins Reason for Call: Privacy/Consent Authorization Summary of Call: Xray results.  Shot helped.  Still a little pain, but better. Initial call taken by: Rudy Jew, RN,  January 01, 2009 12:48 PM  Follow-up for Phone Call        pt's wife informed- arthritis of the hips Follow-up by: Willy Eddy, LPN,  January 01, 2009 2:08 PM

## 2010-04-22 NOTE — Assessment & Plan Note (Signed)
Summary: still not feeling well/dm   Vital Signs:  Patient profile:   70 year old male Height:      70 inches Weight:      198 pounds BMI:     28.51 Temp:     98.2 degrees F oral Pulse rate:   76 / minute Resp:     14 per minute BP sitting:   140 / 80  (left arm)  Vitals Entered By: Willy Eddy, LPN (September 14, 2008 10:33 AM)  CC:  c/o upper abd pain-loose stools with some diarrhea- had nausea last week but no vomiting- completed cipro and flagyl.  History of Present Illness: Not better but the abdominal pain in the mid epigastrum with lethagy, blurred vision and bloating bowel movements dark brown, loose and small size no fever or chills taken the cirpo and flagyl for full course the pain changes with eating, worsens after eating had cholecystectomy 2008 has no prior hx of ulcers mylanta does not help    Hypertension History:      He denies headache, chest pain, palpitations, dyspnea with exertion, orthopnea, PND, peripheral edema, visual symptoms, neurologic problems, syncope, and side effects from treatment.        Positive major cardiovascular risk factors include male age 28 years old or older, hyperlipidemia, and hypertension.  Negative major cardiovascular risk factors include negative family history for ischemic heart disease and non-tobacco-user status.        Positive history for target organ damage include ASHD (either angina/prior MI/prior CABG).     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 Take 1 Tablet By Mouth Q Hs 2)  Benicar Hct 20-12.5 Mg Tabs (Olmesartan Medoxomil-Hctz) .[name].. 1 Once Daily 3)  Pulmicort Flexhaler 90 Mcg/act  Inha (Budesonide (Inhalation)) .... One Puff P.o. Bid 4)  Lunesta 2 Mg  Tabs (Eszopiclone) .... One By Mouth Q Hs Prn 5)  Clarinex 5 Mg  Tabs (Desloratadine) .... One By Mouth Daily 6)  Bl Adult Aspirin Low Strength 81 Mg  Chew (Aspirin) .... Once Daily 7)  Vitamin B-12 100 Mcg  Tabs (Cyanocobalamin) .... One P.o. Daily Over-The-Counter 8)  Nasonex 50 Mcg/act  Susp (Mometasone Furoate) .... Two Sprays Q Nostril Daily 9)  Folic Acid 1 Mg  Tabs (Folic Acid) .... Once Daily 10)  Mirapex 0.125 Mg  Tabs (Pramipexole Dihydrochloride) .... One At Bedtime As Needed 11)  Valium 5 Mg  Tabs (Diazepam) .... At Bedtime As Needed 12)  Meloxicam 15 Mg  Tabs (Meloxicam) .... One By Mouth Daily 13)  Prilosec 40 Mg  Cpdr (Omeprazole) .... Once Daily As Needed 14)  Lac-Hydrin Five 5 % Lotn (Ammonium Lactate) .... Apply To Affected Area Two Times A Day 15)  Astepro 0.15 % Soln (Azelastine Hcl) .... Two Sprays Q Nostril Q Hs 16)  Cipro 500 Mg Tabs (Ciprofloxacin Hcl) .... One By Mouth Two Times A Day X 10 Days. 17)  Flagyl 250 Mg Tabs (Metronidazole) .... One By Mouth Qid X 10 Days.  No Alcohol!  Current Medications (verified): 1)  Crestor 10 Mg Tabs (Rosuvastatin Calcium) .... Take 1 Tablet By Mouth Q Hs 2)  Benicar Hct 20-12.5 Mg Tabs (Olmesartan Medoxomil-Hctz) .[name].. 1 Once Daily 3)  Pulmicort Flexhaler 90 Mcg/act  Inha (Budesonide (Inhalation)) .... One Puff P.o. Bid 4)  Lunesta 2 Mg  Tabs (Eszopiclone) .... One By Mouth Q Hs Prn 5)  Clarinex 5 Mg  Tabs  (Desloratadine) .... One By Mouth Daily 6)  Bl Adult Aspirin Low Strength 81 Mg  Chew (Aspirin) .... Once Daily 7)  Vitamin B-12 100 Mcg  Tabs (Cyanocobalamin) .... One P.o. Daily Over-The-Counter 8)  Nasonex 50 Mcg/act  Susp (Mometasone Furoate) .... Two Sprays Q Nostril Daily 9)  Folic Acid 1 Mg  Tabs (Folic Acid) .... Once Daily 10)  Mirapex 0.125 Mg  Tabs (Pramipexole Dihydrochloride) .... One At Bedtime As Needed 11)  Valium 5 Mg  Tabs (Diazepam) .... At Bedtime As Needed 12)  Meloxicam 15 Mg  Tabs (Meloxicam) .... One By Mouth Daily 13)  Prilosec 20 Mg Cpdr (Omeprazole) .... One By Mouth Two Times A Day With Samples 14)  Lac-Hydrin Five 5 % Lotn (Ammonium Lactate) .... Apply To Affected Area Two Times A Day 15)  Astepro 0.15 % Soln (Azelastine Hcl) .... Two Sprays Q Nostril Q Hs  Allergies (verified): 1)  ! Vicodin (Hydrocodone-Acetaminophen) 2)  ! Bromfenex 3)  ! Doxycycline Hyclate (Doxycycline Hyclate) 4)  ! Ketek (Telithromycin) 5)  ! Promethazine Hcl (Promethazine Hcl) 6)  ! Provigil (Modafinil) 7)  Sulfa 8)  Ery-Tab (Erythromycin Base) 9)  Biaxin (Clarithromycin) 10)  Hydrocodone-Acetaminophen (Hydrocodone-Acetaminophen)  Past History:  Family History: Last updated: 06/28/2007 father Family History of CAD Male 1st degree relative <50 mother passed a 55  Social History: Last updated: 06/28/2007 Married Former Smoker Retired  Risk Factors: Caffeine Use: 5+ (02/21/2007) Exercise: no (02/21/2007)  Risk Factors: Smoking Status: quit (11/02/2006) Passive Smoke Exposure: no (02/21/2007)  Past medical, surgical, family and social histories (including risk factors) reviewed, and no changes noted (except as noted below).  Past Medical History: Reviewed history from 02/21/2007 and no changes required. 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 Thyroidectomy partial Coronary artery bypass graft Cholecystectomy arthroscopy to knee Cervical fusion Cervical laminectomy 05/26/06 athroscopy right knee 04/2008  daldorf  Family History: Reviewed history from 06/28/2007 and no changes required. father Family History of CAD Male 1st degree relative <50 mother passed a 30  Social History: Reviewed history from 06/28/2007 and no changes required. Married Former Smoker Retired  Review of Systems  The patient denies anorexia, fever, weight loss, weight gain, vision loss, decreased hearing, hoarseness, chest pain, syncope, dyspnea on exertion, peripheral edema, prolonged cough, headaches, hemoptysis, abdominal pain, melena, hematochezia, severe indigestion/heartburn, hematuria, incontinence, genital sores, muscle weakness, suspicious skin lesions, transient blindness, difficulty walking, depression, unusual weight change, abnormal bleeding, enlarged lymph nodes, angioedema, and breast masses.    Physical Exam  General:  Well-developed,well-nourished,in no acute distress; alert,appropriate and cooperative throughout examination Eyes:  pupils equal, pupils round, and pupils reactive to light.   Nose:  nose piercing  noted and no external erythema.  nasal dischargemucosal pallor and mucosal erythema.   Mouth:  pharynx pink and moist.   Neck:  No deformities, masses, or tenderness noted. Lungs:  Normal respiratory effort, chest expands symmetrically. Lungs are clear to auscultation, no crackles or wheezes. Heart:  Normal rate and regular rhythm. S1 and S2 normal without gallop, murmur, click, rub or other extra sounds. Abdomen:  no rigidity, distended, guarding, and epigastric tenderness.     Impression & Recommendations:  Problem # 1:  ABDOMINAL PAIN, EPIGASTRIC (ICD-789.06) not taking the priolosec no elevation of wbc on 6/17 so infection less likely lfts normal so stone less  likely pancreatitis and gastritis are the highes posibilities  the differential includes PUD, ? retained stones, pancreatitis?  Discussed symptom control with the patient.   Problem # 2:  GERD (ICD-530.81)  His updated medication list for this problem includes:    Prilosec 20 Mg Cpdr (Omeprazole) ..... One by mouth two times a day with samples  Problem # 3:  HYPERTENSION (ICD-401.9)  His updated medication list for this problem includes:    Benicar Hct 20-12.5 Mg Tabs (Olmesartan medoxomil-hctz) .[name]... 1 once daily  BP today: 140/80 Prior BP: 126/80 (06/08/2008)  Prior 10 Yr Risk Heart Disease: N/A (12/14/2006)  Labs Reviewed: K+: 4.5 (04/28/2007) Creat: : 1.44 (04/28/2007)   Chol: 144 (04/28/2007)   HDL: 43 (04/28/2007)   LDL: 74 (04/28/2007)   TG: 137 (04/28/2007)  Complete Medication List: 1)  Crestor 10 Mg Tabs (Rosuvastatin calcium) .... Take 1 tablet by mouth q hs 2)  Benicar Hct 20-12.5 Mg Tabs (Olmesartan medoxomil-hctz) .[name].. 1 once daily 3)  Pulmicort Flexhaler 90 Mcg/act Inha (Budesonide (inhalation)) .... One puff p.o. bid 4)  Lunesta 2 Mg Tabs (Eszopiclone) .... One by mouth q hs prn 5)  Clarinex 5 Mg Tabs (Desloratadine) .... One by mouth daily 6)  Bl Adult Aspirin Low Strength 81 Mg Chew (Aspirin) .... Once daily 7)  Vitamin B-12 100 Mcg Tabs (Cyanocobalamin) .... One p.o. daily over-the-counter 8)  Nasonex 50 Mcg/act Susp (Mometasone furoate) .... Two sprays q nostril daily 9)  Folic Acid 1 Mg Tabs (Folic acid) .... Once daily 10)  Mirapex 0.125 Mg Tabs (Pramipexole dihydrochloride) .... One at bedtime as needed 11)  Valium 5 Mg Tabs (Diazepam) .... At bedtime as needed 12)  Meloxicam 15 Mg Tabs (Meloxicam) .... One by mouth daily 13)  Prilosec 20 Mg Cpdr (Omeprazole) .... One by mouth two times a day with samples 14)  Lac-hydrin Five 5 % Lotn (Ammonium lactate) .... Apply to affected area two times a day 15)  Astepro 0.15 % Soln (Azelastine hcl) .... Two sprays  q nostril q hs  Hypertension Assessment/Plan:      The patient's hypertensive risk group is category C: Target organ damage and/or diabetes.  Today's blood pressure is 140/80.  His blood pressure goal is < 140/90.  Patient Instructions: 1)  Please schedule a follow-up appointment in 4-5weeks. Prescriptions: PULMICORT FLEXHALER 90 MCG/ACT  INHA (BUDESONIDE (INHALATION)) one puff p.o. bid  #2 x 6   Entered by:   Willy Eddy, LPN   Authorized by:   Stacie Glaze MD   Signed by:   Willy Eddy, LPN on 57/84/6962   Method used:   Electronically to        [facility] (retail)       [address]       [place]  [place], [place]  [address]       Ph: [phone]       Fax: [phone]   RxID:   [id number]

## 2010-04-22 NOTE — Miscellaneous (Signed)
Summary: Orders Update   Clinical Lists Changes  Orders: Added new Referral order of Radiology Referral (Radiology) - Signed 

## 2010-04-22 NOTE — Progress Notes (Signed)
Summary: D/C,d provigil, pt withdrawing: anxious ect.  Phone Note Call from Patient Call back at Work Phone [phone]   Caller: Patient Call For: Stacie Glaze MD Summary of Call: CAME OFF THE PROVIGIL-WAS FEELING NERVOUS, HIGH STRUNG, PARANOID, ANXIOUS, UPSET, AND REALLY STRUNG OUT. [facility] STARTING THE HEAD BOBBING THING AND MAKING STUPID MISTAKES. STOPPED TAKING THE PROVIGIL YESTERDAY MORNING Initial call taken by: Barnie Mort,  November 11, 2006 9:24 AM  Follow-up for Phone Call        take 1/2 tab for at least 3 days Follow-up by: Stacie Glaze MD,  November 11, 2006 11:22 AM  Additional Follow-up for Phone Call Additional follow up Details #1::        Phone Call Completed,LM explaining directions Additional Follow-up by: Arcola Jansky, RN,  November 11, 2006 12:00 PM

## 2010-04-22 NOTE — Miscellaneous (Signed)
Summary: Orders Update  Clinical Lists Changes  Orders: Added new Test order of Abdominal Aorta Duplex (Abd Aorta Duplex) - Signed 

## 2010-04-22 NOTE — Assessment & Plan Note (Signed)
Summary: 1 month rov/njr   Vital Signs:  Patient profile:   70 year old male Height:      70 inches Weight:      198 pounds BMI:     28.51 Temp:     98.2 degrees F oral Pulse rate:   76 / minute Resp:     14 per minute BP sitting:   136 / 80  (left arm)  Vitals Entered By: Willy Eddy, LPN (October 18, 2008 12:35 PM)  Nutrition Counseling: Patient's BMI is greater than 25 and therefore counseled on weight management options.  CC:  roa-labs.  History of Present Illness: Asthma follow [place] and htn follow up  Follow-Up Visit      This is a 70 year old man who presents for Follow-up visit.  The patient denies chest pain, palpitations, dizziness, syncope, low blood sugar symptoms, high blood sugar symptoms, edema, SOB, DOE, PND, and orthopnea.  Since the last visit the patient notes being seen by a specialist.  The patient reports taking meds as prescribed and monitoring BP.  When questioned about possible medication side effects, the patient notes nausea and GI upset.    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 Take 1 Tablet By Mouth Q Hs 2)  Benicar Hct 20-12.5 Mg Tabs  (Olmesartan Medoxomil-Hctz) .[name].. 1 Once Daily 3)  Pulmicort Flexhaler 90 Mcg/act  Inha (Budesonide (Inhalation)) .... One Puff P.o. Bid 4)  Lunesta 2 Mg  Tabs (Eszopiclone) .... One By Mouth Q Hs Prn 5)  Clarinex 5 Mg  Tabs (Desloratadine) .... One By Mouth Daily 6)  Bl Adult Aspirin Low Strength 81 Mg  Chew (Aspirin) .... Once Daily 7)  Vitamin B-12 100 Mcg  Tabs (Cyanocobalamin) .... One P.o. Daily Over-The-Counter 8)  Flonase 50 Mcg/act Susp (Fluticasone Propionate) .... 2 Spray Each Nostril Once Daily 9)  Folic Acid 1 Mg  Tabs (Folic Acid) .... Once Daily 10)  Mirapex 0.125 Mg  Tabs (Pramipexole Dihydrochloride) .... One At Bedtime As Needed 11)  Valium 5 Mg  Tabs (Diazepam) .... At Bedtime As Needed 12)  Meloxicam 15 Mg  Tabs (Meloxicam) .... One By Mouth Daily 13)  Prilosec 20 Mg Cpdr (Omeprazole) .... One By Mouth Two Times A Day With Samples 14)  Lac-Hydrin Five 5 % Lotn (Ammonium Lactate) .... Apply To Affected Area Two Times A Day 15)  Astepro 0.15 % Soln (Azelastine Hcl) .... Two Sprays Q Nostril Q Hs  Current Medications (verified): 1)  Crestor 10 Mg Tabs (Rosuvastatin Calcium) .... Take 1 Tablet By Mouth Q Hs 2)  Benicar Hct 20-12.5 Mg Tabs (Olmesartan Medoxomil-Hctz) .[name].. 1 Once Daily 3)  Lunesta 2 Mg  Tabs (  Eszopiclone) .... One By Mouth Q Hs Prn 4)  Clarinex 5 Mg  Tabs (Desloratadine) .... One By Mouth Daily 5)  Bl Adult Aspirin Low Strength 81 Mg  Chew (Aspirin) .... Once Daily 6)  Vitamin B-12 100 Mcg  Tabs (Cyanocobalamin) .... One P.o. Daily Over-The-Counter 7)  Flonase 50 Mcg/act Susp (Fluticasone Propionate) .... 2 Spray Each Nostril Once Daily 8)  Folic Acid 1 Mg  Tabs (Folic Acid) .... Once Daily 9)  Mirapex 0.125 Mg  Tabs (Pramipexole Dihydrochloride) .... One At Bedtime As Needed 10)  Valium 5 Mg  Tabs (Diazepam) .... At Bedtime As Needed 11)  Meloxicam 15 Mg  Tabs (Meloxicam) .... One By Mouth Daily 12)  Prilosec 20 Mg Cpdr (Omeprazole) .... One By Mouth Two  Times A Day With Samples 13)  Lac-Hydrin Five 5 % Lotn (Ammonium Lactate) .... Apply To Affected Area Two Times A Day 14)  Astepro 0.15 % Soln (Azelastine Hcl) .... Two Sprays Q Nostril Q Hs 15)  Qvar 80 Mcg/act Aers (Beclomethasone Dipropionate) .... Two Puffs By Mouth Daily 16)  Proair Hfa 108 (90 Base) Mcg/act  Aers (Albuterol Sulfate) .... Wo Pudd By Mouth  Q 6 Hour Prn  Allergies (verified): 1)  ! Vicodin (Hydrocodone-Acetaminophen) 2)  ! Bromfenex 3)  ! Doxycycline Hyclate (Doxycycline Hyclate) 4)  ! Ketek (Telithromycin) 5)  ! Promethazine Hcl (Promethazine Hcl) 6)  ! Provigil (Modafinil) 7)  Sulfa 8)  Ery-Tab (Erythromycin Base) 9)  Biaxin (Clarithromycin) 10)  Hydrocodone-Acetaminophen (Hydrocodone-Acetaminophen)  Past History:  Family History: Last updated: 06/28/2007 father Family History of CAD Male 1st degree relative <50 mother passed a 48  Social History: Last updated: 06/28/2007 Married Former Smoker Retired  Risk Factors: Caffeine Use: 5+ (02/21/2007) Exercise: no (02/21/2007)  Risk Factors: Smoking Status: quit (11/02/2006) Passive Smoke Exposure: no (02/21/2007)  Past medical, surgical, family and social histories (including risk factors) reviewed, and no changes noted (except as noted below).  Past Medical History: Reviewed history from 02/21/2007 and no changes required. 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 Thyroidectomy partial Coronary artery bypass graft Cholecystectomy arthroscopy to knee Cervical fusion Cervical laminectomy 05/26/06 athroscopy right knee 04/2008  daldorf  Family History: Reviewed history from 06/28/2007 and no changes required. father Family History of CAD Male 1st degree relative <50 mother passed a 23  Social History: Reviewed history from  06/28/2007 and no changes required. Married Former Smoker Retired  Review of Systems  The patient denies anorexia, fever, weight loss, weight gain, vision loss, decreased hearing, hoarseness, chest pain, syncope, dyspnea on exertion, peripheral edema, prolonged cough, headaches, hemoptysis, abdominal pain, melena, hematochezia, severe indigestion/heartburn, hematuria, incontinence, genital sores, muscle weakness, suspicious skin lesions, transient blindness, difficulty walking, depression, unusual weight change, abnormal bleeding, enlarged lymph nodes, angioedema, and breast masses.    Physical Exam  General:  Well-developed,well-nourished,in no acute distress; alert,appropriate and cooperative throughout examination Nose:  nose piercing noted and no external erythema.  nasal dischargemucosal pallor and mucosal erythema.   Mouth:  pharynx pink and moist.   Neck:  No deformities, masses, or tenderness noted. Lungs:  Normal respiratory effort, chest expands symmetrically. Lungs are clear to auscultation, no crackles or wheezes. Heart:  Normal rate and regular rhythm. S1 and S2 normal without gallop, murmur, click, rub or other extra sounds. Abdomen:  no rigidity, distended, guarding, and epigastric tenderness.     Impression &  Recommendations:  Problem # 1:  HYPERTENSION (ICD-401.9)  His updated medication list for this problem includes:    Benicar Hct 20-12.5 Mg Tabs (Olmesartan medoxomil-hctz) .[name]... 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 One by mouth two times a day with samples  Problem # 4:  ASTHMA (ICD-493.90) Assessment: Improved  The following medications were removed from the medication list:    Pulmicort Flexhaler 90 Mcg/act Inha (Budesonide (inhalation)) ..... One puff p.o. bid His updated medication list for this problem includes:    Qvar 80 Mcg/act Aers (Beclomethasone dipropionate) .[name]..[name] Two puffs by mouth daily    Proair Hfa 108 (90 Base) Mcg/act Aers (Albuterol sulfate) .[name]... Wo pudd by mouth  q 6 hour prn  Problem # 5:  HYPERLIPIDEMIA (ICD-272.4)  His updated medication list for this problem includes:    Crestor 10 Mg Tabs (Rosuvastatin calcium) .[name]... Take 1 tablet by mouth q hs  Labs Reviewed: SGOT: 24 (04/28/2007)   SGPT: 27 (04/28/2007)  Prior 10 Yr Risk Heart Disease: N/A (12/14/2006)   HDL:39.80 (10/12/2008), 43 (04/28/2007)  LDL:56 (10/12/2008), 74 (04/28/2007)  Chol:118 (10/12/2008), 144 (04/28/2007)  Trig:113.0 (10/12/2008), 137 (04/28/2007)  Complete Medication List: 1)  Crestor 10 Mg Tabs (Rosuvastatin calcium) .... Take 1 tablet by mouth q hs 2)  Benicar Hct 20-12.5 Mg Tabs (Olmesartan medoxomil-hctz) .[name].. 1 once daily 3)  Lunesta 2 Mg Tabs (Eszopiclone) .... One by mouth q hs prn 4)  Clarinex 5 Mg Tabs (Desloratadine) .... One by mouth daily 5)  Bl Adult Aspirin Low Strength 81 Mg Chew (Aspirin) .... Once daily 6)  Vitamin B-12 100 Mcg Tabs (Cyanocobalamin) .... One p.o. daily over-the-counter 7)  Flonase 50 Mcg/act Susp (Fluticasone propionate) .... 2 spray each nostril once daily 8)  Folic Acid 1 Mg Tabs (Folic acid) .... Once daily 9)  Mirapex 0.125 Mg Tabs (Pramipexole dihydrochloride) .... One at bedtime as needed 10)  Valium 5 Mg Tabs (Diazepam) .... At bedtime as needed 11)  Meloxicam 15 Mg Tabs (Meloxicam) .... One by mouth daily 12)  Prilosec  20 Mg Cpdr (Omeprazole) .... One by mouth two times a day with samples 13)  Lac-hydrin Five 5 % Lotn (Ammonium lactate) .... Apply to affected area two times a day 14)  Astepro 0.15 % Soln (Azelastine hcl) .... Two sprays q nostril q hs 15)  Qvar 80 Mcg/act Aers (Beclomethasone dipropionate) .... Two puffs by mouth daily 16)  Proair Hfa 108 (90 Base) Mcg/act Aers (Albuterol sulfate) .... Wo pudd by mouth  q 6 hour prn  Asthma Management Plan    Plan based on PEF formula: [place] Zone:QVAR 80 MCG/ACT AERS  Yellow Zone: PROAIR HFA 108 (90 BASE) MCG/ACT  AERS:  2 puffs every 4 hours as needed  Patient Instructions: 1)  Please schedule a follow-up appointment in 2 months.

## 2010-04-22 NOTE — Progress Notes (Signed)
Summary: refill  Phone Note Refill Request Call back at Home Phone [phone] Message from:  Patient---live call  Refills Requested: Medication #1:  MELOXICAM 15 MG  TABS one by mouth daily [facility]  Initial call taken by: Warnell Forester,  June 10, 2009 3:13 PM    Prescriptions: MELOXICAM 15 MG  TABS (MELOXICAM) one by mouth daily  #90 Tablet x 3   Entered by:   Willy Eddy, LPN   Authorized by:   Stacie Glaze MD   Signed by:   Willy Eddy, LPN on 09/81/1914   Method used:   Electronically to        [facility] (retail)       [address]       [place], [place]  [address]       Ph: [phone]       Fax: [phone]   RxID:   [id number]

## 2010-04-22 NOTE — Assessment & Plan Note (Signed)
Summary: 1 month rov/sl  Medications Added MIRALAX  POWD (POLYETHYLENE GLYCOL 3350) daily ALIGN  CAPS (PROBIOTIC PRODUCT) 1 by mouth daily      Allergies Added:   Visit Type:  Follow-up Primary Provider:  Darryll Capers, MD  CC:  CAD.  History of Present Illness: The patient presents for evaluation of coronary disease. When he was last seen he was complaining of chest discomfort. However, a stress perfusion study demonstrated no evidence of ischemia with a well-preserved ejection fraction. He has also been having increased dyspnea but has been followed by our pulmonary group. He says that currently he is having no chest discomfort. His breathing is much improved. He tries to exercise aerobically 5 times per week and has had no symptoms related to this. He denies any chest pressure, neck or arm discomfort. He is not describing palpitations, presyncope or syncope. He does say that he has fatigue and gets a strange sensation of being tired her fall and the top of his head and then has to sleep.  Current Medications (verified): 1)  Crestor 10 Mg Tabs (Rosuvastatin Calcium) .... Take 1 Tablet By Mouth Q Hs 2)  Benicar Hct 20-12.5 Mg Tabs (Olmesartan Medoxomil-Hctz) .[name].. 1 Once Daily 3)  Clarinex 5 Mg  Tabs (Desloratadine) .... One By Mouth Daily 4)  Bl Adult Aspirin Low Strength 81 Mg  Chew (Aspirin) .... Once Daily 5)  Vitamin B-12 100 Mcg  Tabs (Cyanocobalamin) .... One P.o. Daily Over-The-Counter 6)  Flonase 50 Mcg/act Susp (Fluticasone Propionate) .... 2 Spray Each Nostril Once Daily 7)  Folic Acid 1 Mg  Tabs (Folic Acid) .... Once Daily 8)  Mirapex 0.125 Mg  Tabs (Pramipexole Dihydrochloride) .... One At Bedtime As Needed 9)  Valium 5 Mg  Tabs (Diazepam) .... At Bedtime As Needed 10)  Meloxicam 15 Mg  Tabs (Meloxicam) .... One By Mouth Daily 11)  Prilosec 20 Mg Cpdr (Omeprazole) .... One By Mouth Two Times A Day With Samples 12)  Lac-Hydrin Five 5 % Lotn (Ammonium Lactate) .... Apply To  Affected Area Two Times A Day 13)  Qvar 80 Mcg/act Aers (Beclomethasone Dipropionate) .... Two Puffs By Mouth Daily 14)  Proair Hfa 108 (90 Base) Mcg/act  Aers (Albuterol Sulfate) .... Wo Pudd By Mouth  Q 6 Hour Prn 15)  Align  Caps (Probiotic Product) .... One Tablet By Mouth Once Daily 16)  Healthy Hearing Multivitamin .... Take Two Twice Daily 17)  Benefiber  Powd (Wheat Dextrin) .... Mix Powder in Water. 18)  Nitrostat 0.4 Mg Subl (Nitroglycerin) .[name].. 1 Tablet Under Tongue At Onset of Chest Pain; You May Repeat Every 5 Minutes For Up To 3 Doses. 19)  Miralax  Powd (Polyethylene Glycol 3350) .... Daily 20)  Align  Caps (Probiotic Product) .[name].. 1 By Mouth Daily  Allergies (verified): 1)  ! Vicodin (Hydrocodone-Acetaminophen) 2)  ! Bromfenex 3)  ! Doxycycline Hyclate (Doxycycline Hyclate) 4)  ! Ketek (Telithromycin) 5)  ! Promethazine Hcl (Promethazine Hcl) 6)  ! Provigil (Modafinil) 7)  ! Codeine 8)  Sulfa 9)  Ery-Tab (Erythromycin Base) 10)  Biaxin (Clarithromycin) 11)  Hydrocodone-Acetaminophen (Hydrocodone-Acetaminophen)  Past History:  Past Medical History: Asthma......Marland KitchenDr Charna Archer > dxed 2002. On QVAR since then. 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   Vital Signs:  Patient profile:   70 year old male Height:      67 inches Weight:      197 pounds BMI:     30.97 Pulse rate:   71  / minute Resp:     16 per minute BP sitting:   142 / 92  (right arm)  Vitals Entered By: Marrion Coy, CNA (June 10, 2009 10:01 AM)  Physical Exam  General:  Well developed, well nourished, in no acute distress. Head:  normocephalic and atraumatic Eyes:  PERRLA/EOM intact; conjunctiva and lids normal. Mouth:  Teeth, gums and palate normal. Oral mucosa normal. Neck:  Neck supple, no JVD. No masses, thyromegaly or abnormal cervical nodes. Chest Wall:  no deformities or breast masses noted Abdomen:  Bowel sounds positive; abdomen soft and non-tender without masses, organomegaly, or hernias noted. No hepatosplenomegaly, obese Msk:  Back normal, normal gait. Muscle strength and tone normal. Extremities:  No clubbing or cyanosis. Neurologic:  Alert and oriented x 3. Skin:  Intact without lesions or rashes. Cervical Nodes:  no significant adenopathy Axillary Nodes:  no significant adenopathy Inguinal Nodes:  no significant adenopathy Psych:  Normal affect.   Detailed Cardiovascular Exam  Neck    Carotids: Carotids full and equal bilaterally without bruits.      Neck Veins: Normal, no JVD.    Heart    Inspection: no deformities or lifts noted.      Palpation: normal PMI with no thrills palpable.      Auscultation: regular rate and rhythm, S1, S2 without murmurs, rubs, gallops, or clicks.    Vascular    Abdominal Aorta: no palpable masses, pulsations, or audible bruits.      Femoral Pulses: normal femoral pulses bilaterally.      Pedal Pulses: normal pedal pulses bilaterally.      Radial Pulses: normal radial pulses bilaterally.      Peripheral Circulation: no clubbing, cyanosis, or edema noted with normal capillary refill.     Impression & Recommendations:  Problem # 1:  CORONARY ARTERY DISEASE (ICD-414.00) He has no new symptoms and is precipitating in risk reduction. No further testing is suggested  Problem # 2:  HYPERLIPIDEMIA (ICD-272.4) I review his lipids from last  July with an LDL of 56 and an HDL of almost 40. He is on a good regimen and at target and will continue this.  Problem # 3:  FATIGUE (ICD-780.79) I reviewed his medications, his most recent thyroid study and discussed his sleep apnea with him. I don't see a clear cause for this. I'll defer to Dr. Lovell Sheehan any further workup such as vitamin D level. He could be reassessed for worsening sleep apnea if this symptom progresses.  Patient Instructions: 1)  Your physician recommends that you schedule a follow-up appointment in: 12 months 05/2010 2)  Your physician recommends that you continue on your current medications as directed. Please refer to the Current Medication list given to you today.

## 2010-04-22 NOTE — Progress Notes (Signed)
Summary: Diverticulitits diet?   Phone Note Call from Patient Call back at Home Phone [phone]   Call For: DR Jarold Motto Summary of Call: Has a restrictive diet-Can not stand to eat vegetables or fish. Should he be restricting his diet with Diverticulitits? Initial call taken by: Leanor Kail [facility],  February 28, 2009 12:57 PM  Follow-up for Phone Call        Discussed diet with pt.  He states he is taking benefiber daily.  DOes not like vegitables and fruits very much.  Pt instructed to stay on liqiud diet while having an acute attack of diverticulitis. Follow-up by: Ashok Cordia RN,  February 28, 2009 2:15 PM

## 2010-04-22 NOTE — Progress Notes (Signed)
Summary: Nuclear Pre-Procedure  Phone Note Outgoing Call Call back at [facility] Phone [phone]   Call placed by: Stanton Kidney, EMT-P,  May 13, 2009 3:22 PM Action Taken: Phone Call Completed Summary of Call: Reviewed information on [employer] Information Sheet (see scanned document for further details).  Spoke with Patient.    Nuclear Med Background Indications for Stress Test: Evaluation for Ischemia, Graft Patency   History: Asthma, CABG, COPD, Heart Catheterization, Myocardial Infarction, Myocardial Perfusion Study  History Comments: '01 MI: NSTEMI > Heart Cath: EF=50%, 3V Dz > CABG x 5 '06 MPS: EF=63%, (-) ischemia  Symptoms: Chest Pain, Chest Tightness    Nuclear Pre-Procedure Cardiac Risk Factors: Family History - CAD, History of Smoking, Hypertension, Lipids Height (in): 66

## 2010-04-22 NOTE — Assessment & Plan Note (Signed)
Summary: follow up on meds/mhf   Vital Signs:  Patient Profile:   70 Years Old Male Height:     70 inches Weight:      204 pounds Temp:     98.2 degrees F oral Pulse rate:   76 / minute Resp:     14 per minute BP sitting:   132 / 80  (left arm)  Vitals Entered By: Willy Eddy, LPN (August 24, 452 11:58 AM)                 Chief Complaint:  roa.  History of Present Illness:  Follow-Up Visit      This is a 70 year old man who presents for Follow-up visit.  weight loss.  The patient denies chest pain, palpitations, dizziness, syncope, low blood sugar symptoms, high blood sugar symptoms, edema, SOB, DOE, PND, and orthopnea.  Since the last visit the patient notes no new problems or concerns.  The patient reports monitoring BP and dietary noncompliance.  When questioned about possible medication side effects, the patient notes none.      Current Allergies: ! VICODIN (HYDROCODONE-ACETAMINOPHEN) ! BROMFENEX ! DOXYCYCLINE HYCLATE (DOXYCYCLINE HYCLATE) ! KETEK (TELITHROMYCIN) ! PROMETHAZINE HCL (PROMETHAZINE HCL) ! PROVIGIL (MODAFINIL) SULFA ERY-TAB (ERYTHROMYCIN BASE) BIAXIN (CLARITHROMYCIN) HYDROCODONE-ACETAMINOPHEN (HYDROCODONE-ACETAMINOPHEN)  Past Medical History:    Reviewed history from 02/21/2007 and no changes required:       Asthma       Coronary artery disease       Depression       GERD       Hyperlipidemia       IBS       medial meniscal tear       degerative disc dz       Hypertension  Past Surgical History:    Reviewed history from 10/12/2006 and no changes required:       Thyroidectomy partial       Coronary artery bypass graft       Cholecystectomy       arthroscopy to knee       Cervical fusion       Cervical laminectomy 05/26/06   Family History:    Reviewed history from 06/28/2007 and no changes required:       father       Family History of CAD Male 1st degree relative <50       mother       passed a 20  Social History:    Reviewed  history from 06/28/2007 and no changes required:       Married       Former Smoker       Retired    Review of Systems  The patient denies anorexia, fever, weight loss, weight gain, vision loss, decreased hearing, hoarseness, chest pain, syncope, dyspnea on exertion, peripheral edema, prolonged cough, headaches, hemoptysis, abdominal pain, melena, hematochezia, severe indigestion/heartburn, hematuria, incontinence, genital sores, muscle weakness, suspicious skin lesions, transient blindness, difficulty walking, depression, unusual weight change, abnormal bleeding, enlarged lymph nodes, angioedema, and breast masses.     Physical Exam  General:     Well-developed,well-nourished,in no acute distress; alert,appropriate and cooperative throughout examination Eyes:     pupils equal, pupils round, and pupils reactive to light.   Nose:     nose piercing noted and no external erythema.   Mouth:     pharynx pink and moist.   Neck:     No deformities, masses, or tenderness noted.  Lungs:     Normal respiratory effort, chest expands symmetrically. Lungs are clear to auscultation, no crackles or wheezes. Heart:     Normal rate and regular rhythm. S1 and S2 normal without gallop, murmur, click, rub or other extra sounds. Abdomen:     Bowel sounds positive,abdomen soft and non-tender without masses, organomegaly or hernias noted. Extremities:     No clubbing, cyanosis, edema, or deformity noted with normal full range of motion of all joints.   Neurologic:     No cranial nerve deficits noted. Station and gait are normal. Plantar reflexes are down-going bilaterally. DTRs are symmetrical throughout. Sensory, motor and coordinative functions appear intact.   Shoulder/Elbow Exam  Elbow Exam:    Left:    Inspection:  Abnormal    Palpation:  Abnormal       Location:  left medial epicondyle    Stability:  stable    Tenderness:  left medial epicondyle    Swelling:  left medial epicondyle     Erythema:  no    Impression & Recommendations:  Problem # 1:  MEDIAL EPICONDYLITIS, LEFT (ICD-726.31)  Orders: Joint Aspirate / Injection, Large (20610) Depo- Medrol 40mg  (J1030)   Problem # 2:  HYPERTENSION (ICD-401.9)  His updated medication list for this problem includes:    Benicar Hct 20-12.5 Mg Tabs (Olmesartan medoxomil-hctz) ..... One by mouth daily  BP today: 132/80 Prior BP: 138/88 (06/28/2007)  Prior 10 Yr Risk Heart Disease: N/A (12/14/2006)  Labs Reviewed: Creat: 1.44 (04/28/2007) Chol: 144 (04/28/2007)   HDL: 43 (04/28/2007)   LDL: 74 (04/28/2007)   TG: 137 (04/28/2007)   Problem # 3:  HYPERLIPIDEMIA (ICD-272.4)  His updated medication list for this problem includes:    Crestor 10 Mg Tabs (Rosuvastatin calcium) .[name]... Take 1 tablet by mouth q hs  Labs Reviewed: Chol: 144 (04/28/2007)   HDL: 43 (04/28/2007)   LDL: 74 (04/28/2007)   TG: 137 (04/28/2007) SGOT: 24 (04/28/2007)   SGPT: 27 (04/28/2007)  Prior 10 Yr Risk Heart Disease: N/A (12/14/2006)   Problem # 4:  GERD (ICD-530.81)  His updated medication list for this problem includes:    Prilosec 40 Mg Cpdr (Omeprazole) ..... Once daily as needed  Diagnostics Reviewed:  Discussed lifestyle modifications, diet, antacids/medications, and preventive measures. Handout provided.   Complete Medication List: 1)  Crestor 10 Mg Tabs (Rosuvastatin calcium) .... Take 1 tablet by mouth q hs 2)  Benicar Hct 20-12.5 Mg Tabs (Olmesartan medoxomil-hctz) .... One by mouth daily 3)  Pulmicort Flexhaler 90 Mcg/act Inha (Budesonide (inhalation)) .... One puff p.o. bid 4)  Lunesta 2 Mg Tabs (Eszopiclone) .... One by mouth q hs prn 5)  Clarinex 5 Mg Tabs (Desloratadine) .... One by mouth daily 6)  Bl Adult Aspirin Low Strength 81 Mg Chew (Aspirin) .... Once daily 7)  Vitamin B-12 100 Mcg Tabs (Cyanocobalamin) .... One p.o. daily over-the-counter 8)  Nasonex 50 Mcg/act Susp (Mometasone furoate) .... Two sprays q nostril  daily 9)  Folic Acid 1 Mg Tabs (Folic acid) .... Once daily 10)  Mirapex 0.125 Mg Tabs (Pramipexole dihydrochloride) .... One at bedtime as needed 11)  Valium 5 Mg Tabs (Diazepam) .... At bedtime as needed 12)  Meloxicam 15 Mg Tabs (Meloxicam) .... One by mouth daily 13)  Prilosec 40 Mg Cpdr (Omeprazole) .... Once daily as needed   Patient Instructions: 1)  Please schedule a follow-up appointment in 3 months.   Prescriptions: PRILOSEC 40 MG  CPDR (OMEPRAZOLE) once daily as needed  #  30 x 11   Entered and Authorized by:   Stacie Glaze MD   Signed by:   Stacie Glaze MD on 08/25/2007   Method used:   Electronically sent to ...       [facility]*       [address]       [place], [place]  [address]       Ph: [phone]       Fax: [phone]   RxID:   [id number] BENICAR HCT 20-12.5 MG  TABS (OLMESARTAN MEDOXOMIL-HCTZ) one by mouth daily  #90 x 3   Entered and Authorized by:   Stacie Glaze MD   Signed by:   Stacie Glaze MD on 08/25/2007   Method used:   Electronically sent to ...       [facility]*       [address]       [place], [place]  [address]       Ph: [phone]       Fax: [phone]   RxID:   [id number] Steffanie Rainwater 14 MCG/ACT  INHA (BUDESONIDE (INHALATION)) one puff p.o. bid  #3 x 3   Entered and Authorized by:   Stacie Glaze MD   Signed by:   Stacie Glaze MD on 08/25/2007   Method used:   Electronically sent to ...       [facility]*       [address]       [place], [place]  [address]       Ph: [phone]       Fax: [phone]   RxID:   [id number] PRILOSEC 40 MG  CPDR (OMEPRAZOLE) once daily as needed  #30 x 11   Entered by:   Willy Eddy, LPN   Authorized by:   Stacie Glaze MD   Signed by:   Willy Eddy, LPN on 60/73/7106   Method used:   Electronically sent to ...       [facility]       [address]       [place], [place]  [address]       Ph: [phone]       Fax: [phone]   RxID:   [id number] Steffanie Rainwater 37 MCG/ACT  INHA (BUDESONIDE (INHALATION)) one puff p.o. bid  #2 x 11   Entered by:   Willy Eddy, LPN   Authorized by:   Stacie Glaze MD   Signed by:   Willy Eddy, LPN on 25/85/2778   Method used:   Electronically sent to ...       [facility]*       [address]       [place], [place]  [address]       Ph: [phone]       Fax: [phone]   RxID:   [id number] BENICAR HCT 20-12.5 MG  TABS (OLMESARTAN MEDOXOMIL-HCTZ) one by mouth daily  #30 x 11   Entered by:   Willy Eddy, LPN   Authorized by:   Stacie Glaze MD   Signed by:   Willy Eddy, LPN on 25/07/3974   Method used:  Electronically sent to ...       [facility]*       [address]       [place], [place]  [address]       Ph: [phone]       Fax: [phone]   RxID:   [id number]  ]

## 2010-04-22 NOTE — Assessment & Plan Note (Signed)
Summary: 3 month rov/njr rsc bmp/njr   Vital Signs:  Patient profile:   70 year old male Height:      66 inches Weight:      200 pounds BMI:     32.40 Temp:     98.2 degrees F oral Pulse rate:   80 / minute Resp:     14 per minute BP sitting:   130 / 80  (left arm)  Vitals Entered By: Willy Eddy, LPN (March 28, 2009 9:29 AM) CC: roa, Hypertension Management, Abdominal Pain   Primary Care Provider:  Darryll Capers, MD  CC:  roa, Hypertension Management, and Abdominal Pain.  History of Present Illness: Pt presents for follow up of multiple problems  Dyspepsia History:      There is a prior history of GERD.  He notes that it has been less than 12 months since the last episode of GERD.  The patient does not have a prior history of documented ulcer disease.  The dominant symptom is heartburn or acid reflux.  An H-2 blocker medication is not currently being taken.  A prior EGD has been done which showed moderate or severe esophagitis.    Hypertension History:      He denies headache, chest pain, palpitations, dyspnea with exertion, orthopnea, PND, peripheral edema, visual symptoms, neurologic problems, syncope, and side effects from treatment.        Positive major cardiovascular risk factors include male age 80 years old or older, hyperlipidemia, and hypertension.  Negative major cardiovascular risk factors include negative family history for ischemic heart disease and non-tobacco-user status.        Positive history for target organ damage include ASHD (either angina/prior MI/prior CABG).      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) 11)  Preventive Health Care  (ICD-V70.0) 12)  Family History of Cad Male 1st Degree Relative <50  (ICD-V17.3) 13)  Tinnitus, Chronic, 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 Take 1 Tablet By Mouth Q Hs 2)  Benicar Hct 20-12.5 Mg Tabs (Olmesartan Medoxomil-Hctz) .[name].. 1 Once Daily 3)  Lunesta 2 Mg  Tabs (Eszopiclone) .... One By Mouth Q Hs Prn 4)  Clarinex 5 Mg  Tabs (Desloratadine) .... One By Mouth Daily 5)  Bl Adult Aspirin Low Strength 81 Mg  Chew (Aspirin) .... Once Daily 6)  Vitamin B-12 100 Mcg  Tabs (Cyanocobalamin) .Marland KitchenMarland KitchenMarland Kitchen  One P.o. Daily Over-The-Counter 7)  Flonase 50 Mcg/act Susp (Fluticasone Propionate) .... 2 Spray Each Nostril Once Daily 8)  Folic Acid 1 Mg  Tabs (Folic Acid) .... Once Daily 9)  Mirapex 0.125 Mg  Tabs (Pramipexole Dihydrochloride) .... One At Bedtime As Needed 10)  Valium 5 Mg  Tabs (Diazepam) .... At Bedtime As Needed 11)  Meloxicam 15 Mg  Tabs (Meloxicam) .... One By Mouth Daily 12)  Prilosec 20 Mg Cpdr (Omeprazole) .... One By Mouth Two Times A Day With Samples 13)  Lac-Hydrin Five 5 % Lotn (Ammonium Lactate) .... Apply To Affected Area Two Times A Day 14)  Qvar 80 Mcg/act Aers (Beclomethasone Dipropionate) .... Two  Puffs By Mouth Daily 15)  Proair Hfa 108 (90 Base) Mcg/act  Aers (Albuterol Sulfate) .... Wo Pudd By Mouth  Q 6 Hour Prn 16)  Align  Caps (Probiotic Product) .... One Tablet By Mouth Once Daily 17)  Healthy Hearing Multivitamin .... Once Daily 18)  Miralax  Powd (Polyethylene Glycol 3350) .... Take 1 Capful Wih 8 Oz of Water or Juice Daily 19)  Benefiber  Powd (Wheat Dextrin) .... Mix Powder in Water. 20)  Hyomax-Sl 0.125 Mg Subl (Hyoscyamine Sulfate) .... Take 1 Tab Every 4 Hours As Needed For Pain 21)  Ciprofloxacin Hcl 500 Mg  Tabs (Ciprofloxacin Hcl) .... Take 1 Twice A Day Times 10 Days  Current Medications (verified): 1)  Crestor 10 Mg Tabs (Rosuvastatin Calcium) .... Take 1 Tablet By Mouth Q Hs 2)  Benicar Hct 20-12.5 Mg Tabs (Olmesartan Medoxomil-Hctz) .[name].. 1 Once Daily 3)  Lunesta 2 Mg  Tabs (Eszopiclone) .... One By Mouth Q Hs Prn 4)  Clarinex 5 Mg  Tabs (Desloratadine) .... One By Mouth Daily 5)  Bl Adult Aspirin Low Strength 81 Mg  Chew (Aspirin) .... Once Daily 6)  Vitamin B-12 100 Mcg  Tabs (Cyanocobalamin) .... One P.o. Daily Over-The-Counter 7)  Flonase 50 Mcg/act Susp (Fluticasone Propionate) .... 2 Spray Each Nostril Once Daily 8)  Folic Acid 1 Mg  Tabs (Folic Acid) .... Once Daily 9)  Mirapex 0.125 Mg  Tabs (Pramipexole Dihydrochloride) .... One At Bedtime As Needed 10)  Valium 5 Mg  Tabs (Diazepam) .... At Bedtime As Needed 11)  Meloxicam 15 Mg  Tabs (Meloxicam) .... One By Mouth Daily 12)  Prilosec 20 Mg Cpdr (Omeprazole) .... One By Mouth Two Times A Day With Samples 13)  Lac-Hydrin Five 5 % Lotn (Ammonium Lactate) .... Apply To Affected Area Two Times A Day 14)  Qvar 80 Mcg/act Aers (Beclomethasone Dipropionate) .... Two Puffs By Mouth Daily 15)  Proair Hfa 108 (90 Base) Mcg/act  Aers (Albuterol Sulfate) .... Wo Pudd By Mouth  Q 6 Hour Prn 16)  Align  Caps (Probiotic Product) .... One Tablet By Mouth Once Daily 17)  Healthy Hearing Multivitamin .... Once Daily 18)   Miralax  Powd (Polyethylene Glycol 3350) .... Take 1 Capful Wih 8 Oz of Water or Juice Daily 19)  Benefiber  Powd (Wheat Dextrin) .... Mix Powder in Water. 20)  Hyomax-Sl 0.125 Mg Subl (Hyoscyamine Sulfate) .... Take 1 Tab Every 4 Hours As Needed For Pain 21)  Ciprofloxacin Hcl 500 Mg  Tabs (Ciprofloxacin Hcl) .... Take 1 Twice A Day Times 10 Days 22)  Align  Caps (Probiotic Product) .... One By Mouth Daily  Allergies (verified): 1)  ! Vicodin (Hydrocodone-Acetaminophen) 2)  ! Bromfenex 3)  ! Doxycycline Hyclate (Doxycycline Hyclate) 4)  ! Ketek (Telithromycin) 5)  ! Promethazine  Hcl (Promethazine Hcl) 6)  ! Provigil (Modafinil) 7)  ! Codeine 8)  Sulfa 9)  Ery-Tab (Erythromycin Base) 10)  Biaxin (Clarithromycin) 11)  Hydrocodone-Acetaminophen (Hydrocodone-Acetaminophen)  Past History:  Family History: Last updated: 02/28/2009 father Family History of CAD Male 1st degree relative <50 mother passed a 38 No FH of Colon Cancer:  Social History: Last updated: 11/06/2008 Married Former Smoker Retired Alcohol Use - yes 2 per week Daily Caffeine Use Illicit Drug Use - no  Risk Factors: Caffeine Use: 5+ (02/21/2007) Exercise: no (02/21/2007)  Risk Factors: Smoking Status: quit (03/28/2009) Passive Smoke Exposure: no (03/28/2009)  Past medical, surgical, family and social histories (including risk factors) reviewed, and no changes noted (except as noted below).  Past Medical History: Reviewed history from 11/06/2008 and no changes required. 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 Thyroidectomy partial Coronary artery bypass graft Cholecystectomy arthroscopy to knee Cervical fusion Cervical  laminectomy 05/26/06 athroscopy right knee 04/2008  daldorf  Family History: Reviewed history from 02/28/2009 and no changes required. father Family History of CAD Male 1st degree relative <50 mother passed a 63 No FH of Colon Cancer:  Social History: Reviewed history from 11/06/2008 and no changes required. Married Former Smoker Retired Alcohol Use - yes 2 per week Daily Caffeine Use Illicit Drug Use - no  Review of Systems  The patient denies anorexia, fever, weight loss, weight gain, vision loss, decreased hearing, hoarseness, chest pain, syncope, dyspnea on exertion, peripheral edema, prolonged cough, headaches, hemoptysis, abdominal pain, melena, hematochezia, severe indigestion/heartburn, hematuria, incontinence, genital sores, muscle weakness, suspicious skin lesions, transient blindness, difficulty walking, depression, unusual weight change, abnormal bleeding, enlarged lymph nodes, angioedema, and breast masses.    Physical Exam  General:  Well-developed,well-nourished,in no acute distress; alert,appropriate and cooperative throughout examination Head:  atraumatic and male-pattern balding.   Eyes:  pupils equal, pupils round, and pupils reactive to light.   Ears:  External ear exam shows no significant lesions or deformities.  Otoscopic examination reveals clear canals, tympanic membranes are intact bilaterally without bulging, retraction, inflammation or discharge. Hearing is grossly normal bilaterally. Nose:  nose piercing noted and no external erythema.  nasal dischargemucosal pallor and mucosal erythema.   Mouth:  pharynx pink and moist.   Neck:  No deformities, masses, or tenderness noted. Lungs:  Normal respiratory effort, chest expands symmetrically. Lungs are clear to auscultation, no crackles or wheezes. Heart:  Normal rate and regular rhythm. S1 and S2 normal without gallop, murmur, click, rub or other extra sounds. Abdomen:  no rigidity, distended, guarding, and  epigastric tenderness.   Msk:  tenderness in the right hip bursa with full range of motion Neurologic:  No cranial nerve deficits noted. Station and gait are normal. Plantar reflexes are down-going bilaterally. DTRs are symmetrical throughout. Sensory, motor and coordinative functions appear intact.   Impression & Recommendations:  Problem # 1:  HYPERTENSION (ICD-401.9)  His updated medication list for this problem includes:    Benicar Hct 20-12.5 Mg Tabs (Olmesartan medoxomil-hctz) .[name]... 1 once daily  BP today: 130/80 Prior BP: 134/80 (02/28/2009)  Prior 10 Yr Risk Heart Disease: N/A (12/14/2006)  Labs Reviewed: K+: 4.5 (01/31/2009) Creat: : 1.5 (01/31/2009)   Chol: 118 (10/12/2008)   HDL: 39.80 (10/12/2008)   LDL: 56 (10/12/2008)   TG: 113.0 (10/12/2008)  Problem # 2:  GERD (ICD-530.81) stable with Dr Eloise Harman treating for mild colitis wth cipro on prilosec and probitic ( align His updated medication list for this problem includes:    Prilosec 20 Mg Cpdr (Omeprazole) ..... One by mouth two times a day with samples    Hyomax-sl 0.125 Mg Subl (Hyoscyamine sulfate) .[name]... Take 1 tab every 4 hours as needed for pain  Problem # 3:  HIP PAIN, RIGHT (ICD-719.45) the injection gave him relief His updated medication list for this problem includes:    Bl Adult Aspirin Low Strength 81 Mg Chew (Aspirin) ..... Once daily    Meloxicam 15 Mg Tabs (Meloxicam) ..... One by mouth daily  Discussed use of medications, application of heat or cold, and exercises.   Complete Medication List: 1)  Crestor 10 Mg Tabs (Rosuvastatin calcium) .... Take 1 tablet by mouth q hs 2)  Benicar Hct 20-12.5 Mg Tabs (Olmesartan medoxomil-hctz) .[name].. 1 once daily 3)  Lunesta 2 Mg Tabs (Eszopiclone) .... One by mouth q hs prn 4)  Clarinex 5 Mg Tabs (Desloratadine) .... One by mouth daily 5)  Bl Adult Aspirin Low Strength 81 Mg Chew (Aspirin) .... Once daily 6)  Vitamin B-12 100 Mcg Tabs (Cyanocobalamin) .... One  p.o. daily over-the-counter 7)  Flonase 50 Mcg/act Susp (Fluticasone propionate) .... 2 spray each nostril once daily 8)  Folic Acid 1 Mg Tabs (Folic acid) .... Once daily 9)  Mirapex 0.125 Mg Tabs (Pramipexole dihydrochloride) .... One at bedtime as needed 10)  Valium 5 Mg Tabs (Diazepam) .... At bedtime as needed 11)  Meloxicam 15 Mg Tabs (Meloxicam) .... One by mouth daily 12)  Prilosec 20 Mg Cpdr (Omeprazole) .... One by mouth two times a day with samples 13)  Lac-hydrin Five 5 % Lotn (Ammonium lactate) .... Apply to affected area two times a day 14)  Qvar 80 Mcg/act Aers (Beclomethasone dipropionate) .... Two puffs by mouth daily 15)  Proair Hfa 108 (90 Base) Mcg/act Aers (Albuterol sulfate) .... Wo pudd by mouth  q 6 hour prn 16)  Align Caps (Probiotic product) .... One tablet by mouth once daily 17)  Healthy Hearing Multivitamin  .... Once daily 18)  Miralax Powd (Polyethylene glycol 3350) .... Take 1 capful wih 8 oz of water or juice daily 19)  Benefiber Powd (Wheat dextrin) .... Mix powder in water. 20)  Hyomax-sl 0.125 Mg Subl (Hyoscyamine sulfate) .... Take 1 tab every 4 hours as needed for pain 21)  Ciprofloxacin Hcl 500 Mg Tabs (Ciprofloxacin hcl) .... Take 1 twice a day times 10 days 22)  Align Caps (Probiotic product) .... One by mouth daily  Hypertension Assessment/Plan:      The patient's hypertensive risk group is category C: Target organ damage and/or diabetes.  Today's blood pressure is 130/80.  His blood pressure goal is < 140/90.  Patient Instructions: 1)  Please schedule a follow-up appointment in 3 months. Prescriptions: CRESTOR 10 MG TABS (ROSUVASTATIN CALCIUM) Take 1 tablet by mouth q hs  #30 x 11   Entered by:   Willy Eddy, LPN   Authorized by:   Stacie Glaze MD   Signed by:   Willy Eddy, LPN on 16/12/9602   Method used:   Electronically to        [facility] (retail)       [address]       [place], [place]  [address]  Ph: [phone]       Fax: [phone]   RxID:   [id number]

## 2010-04-22 NOTE — Assessment & Plan Note (Signed)
Summary: 6 wk rov/njr   Vital Signs:  Patient Profile:   70 Years Old Male Height:     70 inches Weight:      202 pounds Temp:     98.5 degrees F oral Pulse rate:   80 / minute Resp:     14 per minute BP sitting:   140 / 80  (left arm)  Vitals Entered By: Willy Eddy, LPN (April 05, 2008 10:24 AM)                 Chief Complaint:  roa- stopped micardis and went back on benicar due to elevated bp-- monitar may not be functioning properly at home .  History of Present Illness: Saw Dr Yisroel Ramming and has orderd an MRI and brace for left elbow possible knee replacement considered follow up for HTN and insomnia wth allergies  Hypertension History:      He denies headache, chest pain, palpitations, dyspnea with exertion, orthopnea, PND, peripheral edema, visual symptoms, neurologic problems, syncope, and side effects from treatment.        Positive major cardiovascular risk factors include male age 57 years old or older, hyperlipidemia, and hypertension.  Negative major cardiovascular risk factors include negative family history for ischemic heart disease and non-tobacco-user status.        Positive history for target organ damage include ASHD (either angina; prior MI; prior CABG).       Prior Medication List:  CRESTOR 10 MG TABS (ROSUVASTATIN CALCIUM) Take 1 tablet by mouth q hs MICARDIS 40 MG TABS (TELMISARTAN) one by mouth daily PULMICORT FLEXHALER 90 MCG/ACT  INHA (BUDESONIDE (INHALATION)) one puff p.o. bid LUNESTA 2 MG  TABS (ESZOPICLONE) one by mouth q hs prn CLARINEX 5 MG  TABS (DESLORATADINE) one by mouth daily BL ADULT ASPIRIN LOW STRENGTH 81 MG  CHEW (ASPIRIN) once daily VITAMIN B-12 100 MCG  TABS (CYANOCOBALAMIN) one p.o. daily over-the-counter NASONEX 50 MCG/ACT  SUSP (MOMETASONE FUROATE) two sprays q nostril daily FOLIC ACID 1 MG  TABS (FOLIC ACID) once daily MIRAPEX 0.125 MG  TABS (PRAMIPEXOLE DIHYDROCHLORIDE) one at bedtime as needed VALIUM 5 MG  TABS  (DIAZEPAM) at bedtime as needed MELOXICAM 15 MG  TABS (MELOXICAM) one by mouth daily PRILOSEC 40 MG  CPDR (OMEPRAZOLE) once daily as needed LEVAQUIN 500 MG TABS (LEVOFLOXACIN) 1 once daily for 2 weeks LAC-HYDRIN FIVE 5 % LOTN (AMMONIUM LACTATE) apply to affected area two times a day   Current Allergies (reviewed today): ! VICODIN (HYDROCODONE-ACETAMINOPHEN) ! BROMFENEX ! DOXYCYCLINE HYCLATE (DOXYCYCLINE HYCLATE) ! KETEK (TELITHROMYCIN) ! PROMETHAZINE HCL (PROMETHAZINE HCL) ! PROVIGIL (MODAFINIL) SULFA ERY-TAB (ERYTHROMYCIN BASE) BIAXIN (CLARITHROMYCIN) HYDROCODONE-ACETAMINOPHEN (HYDROCODONE-ACETAMINOPHEN)  Past Medical History:    Reviewed history from 02/21/2007 and no changes required:       Asthma       Coronary artery disease       Depression       GERD       Hyperlipidemia       IBS       medial meniscal tear       degerative disc dz       Hypertension  Past Surgical History:    Reviewed history from 10/12/2006 and no changes required:       Thyroidectomy partial       Coronary artery bypass graft       Cholecystectomy       arthroscopy to knee       Cervical fusion  Cervical laminectomy 05/26/06   Family History:    Reviewed history from 06/28/2007 and no changes required:       father       Family History of CAD Male 1st degree relative <50       mother       passed a 73  Social History:    Reviewed history from 06/28/2007 and no changes required:       Married       Former Smoker       Retired    Review of Systems  The patient denies anorexia, fever, weight loss, weight gain, vision loss, decreased hearing, hoarseness, chest pain, syncope, dyspnea on exertion, peripheral edema, prolonged cough, headaches, hemoptysis, abdominal pain, melena, hematochezia, severe indigestion/heartburn, hematuria, incontinence, genital sores, muscle weakness, suspicious skin lesions, transient blindness, difficulty walking, depression, unusual weight change, abnormal  bleeding, enlarged lymph nodes, angioedema, and breast masses.     Physical Exam  General:     Well-developed,well-nourished,in no acute distress; alert,appropriate and cooperative throughout examination Head:     atraumatic and male-pattern balding.   Ears:     External ear exam shows no significant lesions or deformities.  Otoscopic examination reveals clear canals, tympanic membranes are intact bilaterally without bulging, retraction, inflammation or discharge. Hearing is grossly normal bilaterally. Nose:     nose piercing noted and no external erythema.  nasal dischargemucosal pallor and mucosal erythema.   Mouth:     pharynx pink and moist.   Neck:     No deformities, masses, or tenderness noted. Lungs:     Normal respiratory effort, chest expands symmetrically. Lungs are clear to auscultation, no crackles or wheezes. Heart:     Normal rate and regular rhythm. S1 and S2 normal without gallop, murmur, click, rub or other extra sounds. Abdomen:     Bowel sounds positive,abdomen soft and non-tender without masses, organomegaly or hernias noted.    Impression & Recommendations:  Problem # 1:  HYPERLIPIDEMIA (ICD-272.4)  His updated medication list for this problem includes:    Crestor 10 Mg Tabs (Rosuvastatin calcium) .[name]... Take 1 tablet by mouth q hs  Labs Reviewed: Chol: 144 (04/28/2007)   HDL: 43 (04/28/2007)   LDL: 74 (04/28/2007)   TG: 137 (04/28/2007) SGOT: 24 (04/28/2007)   SGPT: 27 (04/28/2007)  Prior 10 Yr Risk Heart Disease: N/A (12/14/2006)   Problem # 2:  SINUSITIS, CHRONIC NOS (ICD-473.9)  The following medications were removed from the medication list:    Levaquin 500 Mg Tabs (Levofloxacin) .[name]... 1 once daily for 2 weeks  His updated medication list for this problem includes:    Nasonex 50 Mcg/act Susp (Mometasone furoate) .[name]..[name] Two sprays q nostril daily    Astepro 0.15 % Soln (Azelastine hcl) .[name]..[name] Two sprays q nostril q hs Take antibiotics for full  duration. Discussed treatment options including indications for coronal CT scan of sinuses and ENT referral.   Problem # 3:  OSTEOARTHROSIS NOS, UNSPECIFIED SITE (ICD-715.90) seeing the orthopedist His updated medication list for this problem includes:    Bl Adult Aspirin Low Strength 81 Mg Chew (Aspirin) ..... Once daily    Meloxicam 15 Mg Tabs (Meloxicam) ..... One by mouth daily Discussed use of medications, application of heat or cold, and exercises.   Problem # 4:  SYMPTOM, HYPERSOMNIA W/SLEEP APNEA NOS (ICD-780.53) improved with the asteproCPAP (cm H20):   Mask Type:   Mask Size:   Problem # 5:  GERD (ICD-530.81)  His updated medication  list for this problem includes:    Prilosec 40 Mg Cpdr (Omeprazole) ..... Once daily as needed  Diagnostics Reviewed:  Discussed lifestyle modifications, diet, antacids/medications, and preventive measures. Handout provided.   Complete Medication List: 1)  Crestor 10 Mg Tabs (Rosuvastatin calcium) .... Take 1 tablet by mouth q hs 2)  Benicar Hct 20-12.5 Mg Tabs (Olmesartan medoxomil-hctz) .[name].. 1 once daily 3)  Pulmicort Flexhaler 90 Mcg/act Inha (Budesonide (inhalation)) .... One puff p.o. bid 4)  Lunesta 2 Mg Tabs (Eszopiclone) .... One by mouth q hs prn 5)  Clarinex 5 Mg Tabs (Desloratadine) .... One by mouth daily 6)  Bl Adult Aspirin Low Strength 81 Mg Chew (Aspirin) .... Once daily 7)  Vitamin B-12 100 Mcg Tabs (Cyanocobalamin) .... One p.o. daily over-the-counter 8)  Nasonex 50 Mcg/act Susp (Mometasone furoate) .... Two sprays q nostril daily 9)  Folic Acid 1 Mg Tabs (Folic acid) .... Once daily 10)  Mirapex 0.125 Mg Tabs (Pramipexole dihydrochloride) .... One at bedtime as needed 11)  Valium 5 Mg Tabs (Diazepam) .... At bedtime as needed 12)  Meloxicam 15 Mg Tabs (Meloxicam) .... One by mouth daily 13)  Prilosec 40 Mg Cpdr (Omeprazole) .... Once daily as needed 14)  Lac-hydrin Five 5 % Lotn (Ammonium lactate) .... Apply to affected area  two times a day 15)  Astepro 0.15 % Soln (Azelastine hcl) .... Two sprays q nostril q hs  Hypertension Assessment/Plan:      The patient's hypertensive risk group is category C: Target organ damage and/or diabetes.  Today's blood pressure is 140/80.  His blood pressure goal is < 140/90.   Patient Instructions: 1)  Please schedule a follow-up appointment in 2 months.   Prescriptions: ASTEPRO 0.15 % SOLN (AZELASTINE HCL) two sprays q nostril q HS  #1 vial x 11   Entered and Authorized by:   Stacie Glaze MD   Signed by:   Stacie Glaze MD on 04/05/2008   Method used:   Print then Give to Patient   RxID:   [id number] BENICAR HCT 20-12.5 MG TABS (OLMESARTAN MEDOXOMIL-HCTZ) 1 once daily  #90 x 3   Entered by:   Willy Eddy, LPN   Authorized by:   Stacie Glaze MD   Signed by:   Willy Eddy, LPN on 14/78/2956   Method used:   Electronically to        [facility] (retail)       [address]       [place], [place]  [address]       Ph: [phone]       Fax: [phone]   RxID:   [id number]

## 2010-04-22 NOTE — Progress Notes (Signed)
Summary: fatigue/sleepy eyes in meetings  Phone Note Call from Patient Call back at Work Phone [phone]   Summary of Call: pt noticing in pat couple of months my tired and sleepiness during meetings, eyes close and go off for just a little bit, very noticeable, experienceing discomfort in right upper leg after exercising, abcess seems to be swrinking. problems that need to be discussed with Dr Lovell Sheehan. please call Initial call taken by: Calvert Cantor,  October 25, 2006 10:24 AM  Follow-up for Phone Call        12:30 pm left message to call back.  Pt did call back and C/O hip pain, pt limping now, on and off pain and getting worse past few weeks.  Seems to have gotten worse since a leg work-out.   Chief complaint is the sleepiness and he is concerned about his job, his boss has mentioned his sleepy eyes in meetings.  Advice please. ..................................................................Marland KitchenSid Falcon LPN  October 25, 2006 12:48 PM  Follow-up by: Sid Falcon LPN,  October 25, 2006 12:26 PM  Additional Follow-up for Phone Call Additional follow up Details #1::        will need ov to detailed to discuss on phone. Put him in the first cancelation we have this week Additional Follow-up by: Stacie Glaze MD,  October 25, 2006 6:08 PM    Additional Follow-up for Phone Call Additional follow up Details #2::    S/W PT. WILL COME ON TUES 11/02/06.

## 2010-04-22 NOTE — Progress Notes (Signed)
Summary: still complains of lethargy and abdominal pain  Phone Note Call from Joshua [name]   Caller: Joshua [name] Call For: Stacie Glaze MD Summary of Call: Pt. is still having abdominal pain, fatigue and lethargy.  Is not eating well, and thinks the antibiotics are not helping.  Would like to be seen before he leaves to go out of town. [phone] Initial call taken by: Lynann Beaver CMA,  September 05, 2008 8:44 AM  Follow-up for Phone Call        pt encouraged to give it time and let us know if not better by friday Follow-up by: Willy Eddy, LPN,  September 05, 2008 8:55 AM

## 2010-04-22 NOTE — Assessment & Plan Note (Signed)
Summary: rov/atyoical chest pain  Medications Added * HEALTHY HEARING MULTIVITAMIN take two twice daily NITROSTAT 0.4 MG SUBL (NITROGLYCERIN) 1 tablet under tongue at onset of chest pain; you may repeat every 5 minutes for up to 3 doses.        Visit Type:  Follow-up Referring Provider:  Dr. Darryll Capers Primary Provider:  Darryll Capers, MD  CC:  chest pain and sob.  History of Present Illness: This is a 70 year old white male patient with history of coronary artery disease status post CABG in 2001. His last stress Myoview was in 2006.  The patient had the flu associated with a severe cough. He was at bedrest for 3 weeks and over the past 2 weeks is only starting to feel himself again. He is complaining of a constant chest pain described more as a soreness, but occasional tight feeling. He says this is nothing like when he had his bypass surgery. Prior to becoming ill, he was doing cardio for 40-60 minutes a day, as well as weights. He denied any symptoms with this. He says the pain is there all the time and does not worsen with activity. He rode the elyptical  yesterday without any trouble. He walked upstairs today and the pain did not change in severity or intensity.he denies radiation of the pain to his neck or his arm or jaw. He denies associated dyspnea, dyspnea on exertion, dizziness, or presyncope.  Problems Prior to Update: 1)  Thrush  (ICD-112.0) 2)  Shortness of Breath (SOB)  (ICD-786.05) 3)  Chest Pain-unspecified  (ICD-786.50) 4)  Asthenia  (ICD-780.79) 5)  Asthma Unspecified With Exacerbation  (ICD-493.92) 6)  Pulmonary Congestion  (ICD-786.9) 7)  Adverse Drug Reaction  (ICD-995.20) 8)  Constipation  (ICD-564.00) 9)  Abdominal Pain -generalized  (ICD-789.07) 10)  Loc Osteoarthros Not Spec Prim/sec Pelv Rgn&thi  (ICD-715.35) 11)  Hip Pain, Right  (ICD-719.45) 12)  Tobacco Abuse, Hx of  (ICD-V15.82) 13)  Anemia, Iron Deficiency  (ICD-280.9) 14)  Heel Pain, Right   (ICD-729.5) 15)  Abdominal Pain, Epigastric  (ICD-789.06) 16)  Medial Epicondylitis, 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) 17)  Preventive Health Care  (ICD-V70.0) 18)  Family History of Cad Male 1st Degree Relative <50  (ICD-V17.3) 19)  Tinnitus, Chronic, 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 Take 1 Tablet By Mouth Q Hs 2)  Benicar Hct 20-12.5 Mg Tabs (Olmesartan Medoxomil-Hctz) .[name].. 1 Once Daily 3)  Clarinex 5 Mg  Tabs (Desloratadine) .... One By Mouth Daily 4)  Bl Adult Aspirin Low Strength 81 Mg  Chew (Aspirin) .... Once Daily 5)  Vitamin B-12 100 Mcg  Tabs (Cyanocobalamin) .... One P.o. Daily Over-The-Counter 6)  Flonase 50 Mcg/act Susp (Fluticasone Propionate) .... 2 Spray Each Nostril Once Daily 7)  Folic Acid 1 Mg  Tabs (Folic Acid) .... Once Daily 8)  Mirapex 0.125 Mg  Tabs (Pramipexole Dihydrochloride) .... One At Bedtime As Needed 9)  Valium 5 Mg  Tabs (Diazepam) .... At Bedtime As Needed 10)  Meloxicam 15 Mg  Tabs (Meloxicam) .... One By Mouth Daily 11)  Prilosec 20 Mg Cpdr (Omeprazole) .... One By Mouth Two Times A Day With Samples 12)  Lac-Hydrin Five 5 % Lotn (Ammonium Lactate) .... Apply To Affected Area Two Times A Day 13)  Qvar 80 Mcg/act Aers (Beclomethasone Dipropionate) .... Two Puffs By Mouth Daily 14)  Proair Hfa 108 (90 Base) Mcg/act  Aers (Albuterol Sulfate) .... Wo Pudd By Mouth  Q 6 Hour Prn 15)  Align  Caps (Probiotic Product) .... One Tablet By  Mouth Once Daily 16)  Healthy Hearing Multivitamin .... Take Two Twice Daily 17)  Benefiber  Powd (Wheat Dextrin) .... Mix Powder in Water.  Allergies: 1)  ! Vicodin (Hydrocodone-Acetaminophen) 2)  ! Bromfenex 3)  ! Doxycycline Hyclate (Doxycycline Hyclate) 4)  ! Ketek (Telithromycin) 5)  ! Promethazine Hcl (Promethazine Hcl) 6)  ! Provigil (Modafinil) 7)  ! Codeine 8)  Sulfa 9)  Ery-Tab (Erythromycin Base) 10)  Biaxin (Clarithromycin) 11)  Hydrocodone-Acetaminophen (Hydrocodone-Acetaminophen)  Past History:  Past Medical History: Last updated: 05/01/2009 Asthma......Marland KitchenDr Charna Archer > dxed 2002. On QVAR since then. REcollects PFTs/spiro but that was normal Coronary artery disease.........Marland KitchenDr Antoine Poche > S/p CABG 2001, Normal perfusion study 2006. 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 Married Former Smoker, quit in 1994 x 35 years avg. >1 ppd Retired Alcohol Use - yes 2 per week Daily Caffeine Use Illicit Drug Use - no Exercise regular  Review of Systems       see history of present illness  Vital Signs:  Patient profile:   70 year old male Height:      66 inches Weight:      199 pounds Pulse rate:   84 / minute Pulse rhythm:   regular BP sitting:   120 / 80  (right arm)  Vitals Entered By: Jacquelin Hawking, CMA (May 09, 2009 11:48 AM)  Physical Exam  General:   Well-nournished, in no acute distress. Neck: No JVD, HJR, Bruit, or thyroid enlargement Lungs: No tachypnea, clear without wheezing, rales, or rhonchi Cardiovascular: RRR, PMI not displaced, heart sounds normal, no murmurs, gallops, bruit, thrill, or heave. Abdomen: BS normal. Soft without organomegaly,  masses, lesions or tenderness. Extremities: without cyanosis, clubbing or edema. Good distal pulses bilateral SKin: Warm, no lesions or rashes  Musculoskeletal: No deformities Neuro: no focal signs    EKG  Procedure date:  05/09/2009  Findings:      normal sinus rhythm, normal EKG  Impression & Recommendations:  Problem # 1:  CHEST PAIN-UNSPECIFIED (ICD-786.50) Patient has several week history of chest pain, associated with a flu and severe cough. It is a constant chest pain. He does have some component of tightness, but mostly a soreness that is there constantly. His EKG is normal. I talked to him about possible hospitalization to rule out ischemia versus stress Myoview. The patient does not think this is cardiac. He would like to proceed with stress, Myoview. I will give him a prescription for nitroglycerin sublingual. I also advised him to come to the emergency room if he has any prolonged chest tightness. His updated medication list for this problem includes:    Bl Adult Aspirin Low Strength 81 Mg Chew (Aspirin) ..... Once daily    Nitrostat  0.4 Mg Subl (Nitroglycerin) .[name]... 1 tablet under tongue at onset of chest pain; you may repeat every 5 minutes for up to 3 doses.  Orders: EKG w/ Interpretation (93000) Nuclear Stress Test (Nuc Stress Test)  Problem # 2:  CORONARY ARTERY DISEASE (ICD-414.00) Patient had CABG in 2001. Last stress Myoview within 2006. We will repeat an exercise Myoview. His updated medication list for this problem includes:    Bl Adult Aspirin Low Strength 81 Mg Chew (Aspirin) ..... Once daily    Nitrostat 0.4 Mg Subl (Nitroglycerin) .[name]... 1 tablet under tongue at onset of chest pain; you may repeat every 5 minutes for up to 3 doses.  Orders: Nuclear Stress Test (Nuc Stress Test)  Patient Instructions: 1)  Your physician recommends that you schedule a follow-up appointment in: 1 month with Dr. Antoine Poche 2)  Your physician has requested that you have an  exercise stress myoview.  For further information please visit [email or web address]  Please follow instruction sheet, as given. To be done monday please. 3)  New medication. Nitroglycerin SL .4 mg as needed for chest pain Prescriptions: NITROSTAT 0.4 MG SUBL (NITROGLYCERIN) 1 tablet under tongue at onset of chest pain; you may repeat every 5 minutes for up to 3 doses.  #25 x 3   Entered by:   Ollen Gross, RN, BSN   Authorized by:   Marletta Lor, PA-C   Signed by:   Ollen Gross, RN, BSN on 05/09/2009   Method used:   Electronically to        [facility] (retail)       [address]       [place], [place]  [address]       Ph: [phone]       Fax: [phone]   RxID:   [id number]

## 2010-04-22 NOTE — Progress Notes (Signed)
Summary: insurance wont cover   Phone Note From Pharmacy Call back at [phone]   Caller: [facility] Noreene Larsson Call For: Gunnar Fusi  Summary of Call: Per pharmacist patients insurance wont cover meds and they cost $102, patient wants to know if theres something similar but cheaper that can be given to him Initial call taken by: Tawni Levy,  January 31, 2009 4:10 PM  Follow-up for Phone Call        I apologized to the pt that I was out on Friday and wasn't here to help him get a less expensive antispasmotic.  He said that Gunnar Fusi had called him with his lab results . He said he really didn't need the Homax.  The cramping and spasms have stopped.  he said he will call us though if he needs something. He thanked me for calling. Follow-up by: Joselyn Glassman,  February 04, 2009 8:32 AM

## 2010-04-22 NOTE — Progress Notes (Signed)
Summary: call back  Phone Note Call from Patient   Caller: Patient Call For: Stacie Glaze MD Summary of Call: Pt called stating he is no better, coughing all night, does not know results of the xray.......and did not talk to anyone Friday about antibioitics, etc??  Please call.  [phone] Initial call taken by: Lynann Beaver CMA,  April 15, 2009 8:43 AM    New/Updated Medications: MEDROL (PAK) 4 MG TABS (METHYLPREDNISOLONE) take as directed CEFTIN 250 MG TABS (CEFUROXIME AXETIL) 1 two times a day for 10 days Prescriptions: CEFTIN 250 MG TABS (CEFUROXIME AXETIL) 1 two times a day for 10 days  #20 x 0   Entered by:   Willy Eddy, LPN   Authorized by:   Stacie Glaze MD   Signed by:   Willy Eddy, LPN on 96/29/5284   Method used:   Electronically to        [facility] (retail)       [address]       [place], [place]  [address]       Ph: [phone]       Fax: [phone]   RxID:   [id number] MEDROL (PAK) 4 MG TABS (METHYLPREDNISOLONE) take as directed  #1 pack x 0   Entered by:   Willy Eddy, LPN   Authorized by:   Stacie Glaze MD   Signed by:   Willy Eddy, LPN on 41/66/0630   Method used:   Electronically to        [facility] (retail)       [address]       [place], [place]  [address]       Ph: [phone]       Fax: [phone]   RxID:   [id number]

## 2010-04-22 NOTE — Progress Notes (Signed)
  Phone Note Call from Patient   Caller: Patient Call For: Dr.Tejay Hubert Summary of Call: Pt states his dizziness is worse and he is staggering.. [phone]  Initial call taken by: Lynann Beaver CMA,  December 29, 2007 9:06 AM  Follow-up for Phone Call        need more information, no appoiments avaliable please triage for details so that something might be called in? Follow-up by: Stacie Glaze MD,  December 29, 2007 9:59 AM  Additional Follow-up for Phone Call Additional follow up Details #1::        Pt states he is dizzy...sitting, standing, walking or supine.  He has pressure in his sinus and the top of his head.  The top of his head feels very tight, and he complains of incresed weakness. His friends tell him that he is staggering, and he feels himself being pulled to the left at time. Additional Follow-up by: Lynann Beaver CMA,  December 29, 2007 10:18 AM    Additional Follow-up for Phone Call Additional follow up Details #2::    order CT of head and sinuses Follow-up by: Stacie Glaze MD,  December 29, 2007 10:50 AM    Appended Document:  Referral Appointment Information Day/Date:10/15   per patient's request Time:2:30 Place/MD:[address]   Address:[address] [place]   Phone/[phone] Patient given appointment information.

## 2010-04-22 NOTE — Assessment & Plan Note (Signed)
Summary: CPX // RS   Vital Signs:  Patient profile:   70 year old male Weight:      194 pounds BMI:     30.49 Pulse rate:   76 / minute Pulse rhythm:   regular BP sitting:   122 / 76  (left arm) Cuff size:   regular  Vitals Entered By: Raechel Ache, RN (October 04, 2009 11:02 AM) CC: OV, labs done. Sees cardio and had stress test in Feb.   Primary Care Provider:  Darryll Capers, MD  CC:  OV and labs done. Sees cardio and had stress test in Feb..  History of Present Illness: The pt was asked about all immunizations, health maint. services that are appropriate to their age and was given guidance on diet exercize  and weight management labs at  [facility] obtained and reivewed w2ith pt today feels well except his right knee ( hx of prior arthroscopy) no acute injury possible with exercize ( squat push up)  Problems Prior to Update: 1)  Back Pain  (ICD-724.5) 2)  Gastroenteritis  (ICD-558.9) 3)  Fatigue  (ICD-780.79) 4)  Asthma  (ICD-493.90) 5)  Thrush  (ICD-112.0) 6)  Shortness of Breath (SOB)  (ICD-786.05) 7)  Chest Pain-unspecified  (ICD-786.50) 8)  Asthenia  (ICD-780.79) 9)  Asthma Unspecified With Exacerbation  (ICD-493.92) 10)  Pulmonary Congestion  (ICD-786.9) 11)  Adverse Drug Reaction  (ICD-995.20) 12)  Constipation  (ICD-564.00) 13)  Abdominal Pain -generalized  (ICD-789.07) 14)  Loc Osteoarthros Not Spec Prim/sec Pelv Rgn&thi  (ICD-715.35) 15)  Hip Pain, Right  (ICD-719.45) 16)  Tobacco Abuse, Hx of  (ICD-V15.82) 17)  Anemia, Iron Deficiency  (ICD-280.9) 18)  Heel Pain, Right  (ICD-729.5) 19)  Abdominal Pain, Epigastric  (ICD-789.06) 20)  Medial Epicondylitis, 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) 21)  Preventive Health Care  (ICD-V70.0) 22)  Family History of Cad Male 1st Degree Relative <50  (ICD-V17.3) 23)  Tinnitus, Chronic, 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 Take 1 Tablet By Mouth Q Hs 2)  Benicar Hct 20-12.5 Mg Tabs (Olmesartan Medoxomil-Hctz) .[name].. 1 Once Daily 3)  Clarinex 5 Mg  Tabs (Desloratadine) .... One By Mouth Daily 4)  Bl Adult Aspirin Low Strength 81 Mg  Chew (Aspirin) .... Once Daily 5)  Vitamin B-12 100 Mcg  Tabs (Cyanocobalamin) .... One P.o. Daily Over-The-Counter 6)  Flonase 50 Mcg/act Susp (Fluticasone Propionate) .... 2 Spray Each Nostril Once Daily 7)  Folic Acid 1 Mg  Tabs (Folic Acid) .... Once Daily 8)  Mirapex 0.125 Mg  Tabs (Pramipexole Dihydrochloride) .... One At Bedtime As Needed 9)  Valium 5 Mg  Tabs (Diazepam) .... At Bedtime As Needed 10)  Prilosec 20 Mg Cpdr (Omeprazole) .... One By Mouth Two Times A Day With Samples 11)  Lac-Hydrin Five 5 % Lotn (Ammonium Lactate) .... Apply To Affected Area Two Times A Day As Needed 12)  Qvar 80 Mcg/act Aers (Beclomethasone Dipropionate) .... Two Puffs By Mouth Daily 13)  Proair  Hfa 108 (90 Base) Mcg/act  Aers (Albuterol Sulfate) .... Wo Pudd By Mouth  Q 6 Hour Prn 14)  Benefiber  Powd (Wheat Dextrin) .... Mix Powder in Water. 15)  Nitrostat 0.4 Mg Subl (Nitroglycerin) .[name].. 1 Tablet Under Tongue At Onset of Chest Pain; You May Repeat Every 5 Minutes For Up To 3 Doses. 16)  Miralax  Powd (Polyethylene Glycol 3350) .... Daily 17)  Align  Caps (Probiotic Product) .[name].. 1 By Mouth Daily 18)  Tramadol Hcl 50 Mg Tabs (Tramadol Hcl) .... One By Mouth Q 6 Hours As Needed Pain Noted Prior Reaction To Codine  Current Medications (verified): 1)  Crestor 10 Mg  Tabs (Rosuvastatin Calcium) .... Take 1 Tablet By Mouth Q Hs 2)  Benicar Hct 20-12.5 Mg Tabs (Olmesartan Medoxomil-Hctz) .[name].. 1 Once Daily 3)  Clarinex 5 Mg  Tabs (Desloratadine) .... One By Mouth Daily 4)  Bl Adult Aspirin Low Strength 81 Mg  Chew (Aspirin) .... Once Daily 5)  Vitamin B-12 100 Mcg  Tabs (Cyanocobalamin) .... One P.o. Daily Over-The-Counter 6)  Flonase 50 Mcg/act Susp (Fluticasone Propionate) .... 2 Spray Each Nostril Once Daily 7)  Folic Acid 1 Mg  Tabs (Folic Acid) .... Once Daily 8)  Mirapex 0.125 Mg  Tabs (Pramipexole Dihydrochloride) .... One At Bedtime As Needed 9)  Valium 5 Mg  Tabs (Diazepam) .... At Bedtime As Needed 10)  Prilosec 20 Mg Cpdr (Omeprazole) .... One By Mouth Two Times A Day With Samples 11)  Lac-Hydrin Five 5 % Lotn (Ammonium Lactate) .... Apply To Affected Area Two Times A Day As Needed 12)  Qvar 80 Mcg/act Aers (Beclomethasone Dipropionate) .... Two Puffs By Mouth Daily 13)  Proair Hfa 108 (90 Base) Mcg/act  Aers (Albuterol Sulfate) .... Wo Pudd By Mouth  Q 6 Hour Prn 14)  Benefiber  Powd (Wheat Dextrin) .... Mix Powder in Water. 15)  Nitrostat 0.4 Mg Subl (Nitroglycerin) .[name].. 1 Tablet Under Tongue At Onset of Chest Pain; You May Repeat Every 5 Minutes For Up To 3 Doses. 16)  Miralax  Powd (Polyethylene Glycol 3350) .... Daily 17)  Align  Caps (Probiotic Product) .[name].. 1 By Mouth Daily 18)  Tramadol Hcl 50 Mg Tabs (Tramadol Hcl) .... One By Mouth Q 6 Hours As Needed Pain Noted Prior Reaction To Codine  Allergies (verified): 1)  ! Vicodin (Hydrocodone-Acetaminophen) 2)  ! Bromfenex 3)  ! Doxycycline Hyclate (Doxycycline Hyclate) 4)  ! Ketek (Telithromycin) 5)  ! Promethazine Hcl (Promethazine Hcl) 6)  ! Provigil (Modafinil) 7)  ! Codeine 8)  Sulfa 9)  Ery-Tab (Erythromycin Base) 10)  Biaxin (Clarithromycin) 11)  Hydrocodone-Acetaminophen (Hydrocodone-Acetaminophen)  Past History:  Family History: Last updated: 02/28/2009 father Family  History of CAD Male 1st degree relative <50 mother passed a 65 No FH of Colon Cancer:  Social History: Last updated: 05/01/2009 Married Former Smoker, quit in 1994 x 35 years avg. >1 ppd Retired Alcohol Use - yes 2 per week Daily Caffeine Use Illicit Drug Use - no Exercise regular  Risk Factors: Caffeine Use: 5+ (02/21/2007) Exercise: no (02/21/2007)  Risk Factors: Smoking Status: never (08/28/2009) Passive Smoke Exposure: no (08/28/2009)  Past medical, surgical, family and social histories (including risk factors) reviewed, and no changes noted (except as noted below).  Past Medical History: Reviewed history from 06/10/2009 and no changes required. Asthma......Marland KitchenDr Charna Archer > dxed 2002. On QVAR since then. 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 Thyroidectomy partial Coronary artery bypass graft Cholecystectomy Cervical fusion Cervical laminectomy 05/26/06 Athroscopy right knee 04/2008  daldorf  Family History: Reviewed history from 02/28/2009 and no changes required. father Family History of CAD Male 1st degree relative <50 mother passed a 15 No FH of Colon Cancer:  Social History: Reviewed history from 05/01/2009 and no changes required. Married Former Smoker, quit in 1994 x 35 years avg. >1 ppd Retired Alcohol Use - yes 2 per week Daily Caffeine Use Illicit Drug Use - no Exercise regular  Physical Exam  General:  Well-developed,well-nourished,in no acute distress; alert,appropriate and cooperative throughout examination Head:  Normocephalic and  atraumatic without obvious abnormalities. No apparent alopecia or balding. Eyes:  anicteric Ears:  hearing  aids removed.  Tympanic membranes and canals clear Nose:  mucosal erythema and mucosal edema.   Mouth:  Oral mucosa and oropharynx without lesions or exudates.  Teeth in good repair. appeared well-hydrated Chest Wall:  no deformities and no tenderness.   Lungs:  Normal respiratory effort, chest expands symmetrically. Lungs are clear to auscultation, no crackles or wheezes. Heart:  normal rate and regular rhythm.   Abdomen:  obese soft and nontender.  No distention, guarding, or rebound.  Bowel sounds normal Rectal:  external hemorrhoid(s).   Genitalia:  uncircumcised.   Prostate:  no nodules and no asymmetry.   Msk:  decreased ROM, joint tenderness, and joint swelling.   Pulses:  R and L carotid,radial,femoral,dorsalis pedis and posterior tibial pulses are full and equal bilaterally Extremities:  No clubbing, cyanosis, edema, or deformity noted with normal full range of motion of all joints.   Neurologic:  No cranial nerve deficits noted. Station and gait are normal. Plantar reflexes are down-going bilaterally. DTRs are symmetrical throughout. Sensory, motor and coordinative functions appear intact.   Impression & Recommendations:  Problem # 1:  PREVENTIVE HEALTH CARE (ICD-V70.0)  Colonoscopy: Location:  [facility].   (12/12/2008) Td Booster: Tdap (04/23/2005)   Flu Vax: Fluvax 3+ (12/26/2008)   Pneumovax: Historical (06/04/2006) Chol: 152 (07/04/2009)   HDL: 44.20 (07/04/2009)   LDL: 56 (10/12/2008)   TG: 113.0 (10/12/2008) TSH: 1.14 (11/06/2008)   Next Colonoscopy due:: 12/2018 (12/12/2008)  Discussed using sunscreen, use of alcohol, drug use, self testicular exam, routine dental care, routine eye care, routine physical exam, seat belts, multiple vitamins, osteoporosis prevention, adequate calcium intake in diet, and recommendations for immunizations.  Discussed  exercise and checking cholesterol.  Discussed gun safety, safe sex, and contraception. Also recommend checking PSA.  Problem # 2:  HYPERTENSION (ICD-401.9)  His updated medication list for this problem includes:    Benicar Hct 20-12.5 Mg Tabs (Olmesartan medoxomil-hctz) .[name]... 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 After care discussed. Pt tolerated procedure well. right knee has two meniscal tears in  past and knee is painfull  His updated medication list for this problem includes:    Bl Adult Aspirin Low Strength 81 Mg Chew (Aspirin) ..... Once daily    Tramadol Hcl 50 Mg Tabs (Tramadol hcl) ..... One by mouth q 6 hours as needed pain noted prior reaction to codine  Discussed use of medications, application of heat or cold, and exercises.   Orders: Joint Aspirate / Injection, Large (20610) Depo- Medrol 40mg  (J1030)  Complete Medication List: 1)  Crestor 10 Mg Tabs (Rosuvastatin calcium) .... Take 1 tablet by mouth q hs 2)  Benicar Hct 20-12.5 Mg Tabs (Olmesartan medoxomil-hctz) .[name].. 1 once daily 3)  Clarinex 5 Mg Tabs (Desloratadine) .... One by mouth daily 4)  Bl Adult Aspirin Low Strength 81 Mg Chew (Aspirin) .... Once daily 5)  Vitamin B-12 100 Mcg Tabs (Cyanocobalamin) .... One p.o. daily over-the-counter 6)  Flonase 50 Mcg/act Susp (Fluticasone propionate) .... 2 spray each nostril once daily 7)  Folic Acid 1 Mg Tabs (Folic acid) .... Once daily 8)  Mirapex 0.125 Mg Tabs (Pramipexole dihydrochloride) .... One at  bedtime as needed 9)  Valium 5 Mg Tabs (Diazepam) .... At bedtime as needed 10)  Prilosec 20 Mg Cpdr (Omeprazole) .... One by mouth two times a day with samples 11)  Lac-hydrin Five 5 % Lotn (Ammonium lactate) .... Apply to affected area two times a day as needed 12)  Qvar 80 Mcg/act Aers (Beclomethasone dipropionate) .... Two puffs by mouth daily 13)  Proair Hfa 108 (90 Base) Mcg/act Aers (Albuterol sulfate) .... Wo pudd by mouth  q 6 hour prn 14)  Benefiber Powd (Wheat dextrin) .... Mix powder in water. 15)  Nitrostat 0.4 Mg Subl (Nitroglycerin) .[name].. 1 tablet under tongue at onset of chest pain; you may repeat every 5 minutes for up to 3 doses. 16)  Miralax Powd (Polyethylene glycol 3350) .... Daily 17)  Align Caps (Probiotic product) .[name].. 1 by mouth daily 18)  Tramadol Hcl 50 Mg Tabs (Tramadol hcl) .... One by mouth q 6 hours as needed pain noted prior reaction to codine  Patient Instructions: 1)  Please schedule a follow-up appointment in 3 months.

## 2010-04-22 NOTE — Progress Notes (Signed)
Summary: FYI  Phone Note Call from Patient   Caller: Patient Call For: Stacie Glaze MD Summary of Call: Pt wanted Dr. Lovell Sheehan the name of the med that the neurologist gave him years ago was Nortriptyline. Initial call taken by: Lynann Beaver CMA,  December 08, 2007 11:59 AM  Follow-up for Phone Call        dr Lovell Sheehan infiormed Follow-up by: Willy Eddy, LPN,  December 08, 2007 12:19 PM

## 2010-04-22 NOTE — Assessment & Plan Note (Signed)
Summary: PULM CONGESTION///kp   Visit Type:  Initial Consult Copy to:  Dr. Darryll Capers Primary Provider/Referring Provider:  Darryll Capers, MD  CC:  Pulmonary Consult for increased SOB and chest pain and congestion x 3 weeks. [name]  History of Present Illness: IOV 05/01/2009. 70 year old male. Developed 'cruds' 3.5 weeks ago. Sudden onset of sore throat. 2 days later wife noticed he was dyspneic. Next morning, had dry cough, felt extremely weak, change in volice, "hard to breathe" and "out of it", and pain in chest in central sternal area bilaterally. Describes this chest pain "non cardiac". Also noticed dark brown sinus drainage at this time which was very different from baseline. Denies fever at onset.  This symptoms had him in bed and sleeping a lot for 2-3 weeks. Overall improving now past few days esp energey levels, and cough that has almost resolved. Also, sinus drainage is clear now and voice is returning to normal. However, still with chest pains and difficulty breathing although this too has improved considerably. Saw PMD. Wheeze heard on exam per chart review. Rx with course of antibiotics x 1, and prednisone did not help but a recent 2nd course of antibiotic has helped.  In terms of chest pain, still in central area. Constant. Severity has improved and is mild now.  Denies any radation. Feels it is very different from MI pain although does say did not have chest pain at time of MI. Laying down makes and sitting makes pain better. Contradicts himself. Says it is non-exertional but also says it gets worse when he does "any type of physical endeavor" like vacuuming or cleaning kitchen floor. Pain also worse when "he moves around".   In terms of associated dyspnea he says this is "not shortness of breath" but more of a "difficulty breathing". Exercise makes it worse. Resting makes it better. Albuterol did not help. Prednisone did not help either  Current Medications (verified): 1)  Crestor 10 Mg  Tabs (Rosuvastatin Calcium) .... Take 1 Tablet By Mouth Q Hs 2)  Benicar Hct 20-12.5 Mg Tabs (Olmesartan Medoxomil-Hctz) .[name].. 1 Once Daily 3)  Clarinex 5 Mg  Tabs (Desloratadine) .... One By Mouth Daily 4)  Bl Adult Aspirin Low Strength 81 Mg  Chew (Aspirin) .... Once Daily 5)  Vitamin B-12 100 Mcg  Tabs (Cyanocobalamin) .... One P.o. Daily Over-The-Counter 6)  Flonase 50 Mcg/act Susp (Fluticasone Propionate) .... 2 Spray Each Nostril Once Daily 7)  Folic Acid 1 Mg  Tabs (Folic Acid) .... Once Daily 8)  Mirapex 0.125 Mg  Tabs (Pramipexole Dihydrochloride) .... One At Bedtime As Needed 9)  Valium 5 Mg  Tabs (Diazepam) .... At Bedtime As Needed 10)  Meloxicam 15 Mg  Tabs (Meloxicam) .... One By Mouth Daily 11)  Prilosec 20 Mg Cpdr (Omeprazole) .... One By Mouth Two Times A Day With Samples 12)  Lac-Hydrin Five 5 % Lotn (Ammonium Lactate) .... Apply To Affected Area Two Times A Day 13)  Qvar 80 Mcg/act Aers (Beclomethasone Dipropionate) .... Two Puffs By Mouth Daily 14)  Proair Hfa 108 (90 Base) Mcg/act  Aers (Albuterol Sulfate) .... Wo Pudd By Mouth  Q 6 Hour Prn 15)  Align  Caps (Probiotic Product) .... One Tablet By Mouth Once Daily 16)  Healthy Hearing Multivitamin .... Once Daily 17)  Miralax  Powd (Polyethylene Glycol 3350) .... Take 1 Capful Wih 8 Oz of Water or Juice Daily 18)  Benefiber  Powd (Wheat Dextrin) .... Mix Powder in Water. 19)  Align  Caps (Probiotic Product) .... One By Mouth Daily 20)  Atuss Ds 30-4-30 Mg/40ml Susp (Pseudoephed Hcl-Cpm-Dm Hbr Tan) .... 2 Tsp Every 12 Hours 21)  Clarithromycin 500 Mg Xr24h-Tab (Clarithromycin) .... His Reaction To Erythromicin Is Gi Upset Therfore We Will Procede 1 By Mouth Two Times A Day  Allergies (verified): 1)  ! Vicodin (Hydrocodone-Acetaminophen) 2)  ! Bromfenex 3)  ! Doxycycline Hyclate (Doxycycline Hyclate) 4)  ! Ketek (Telithromycin) 5)  ! Promethazine Hcl (Promethazine Hcl) 6)  ! Provigil (Modafinil) 7)  ! Codeine 8)   Sulfa 9)  Ery-Tab (Erythromycin Base) 10)  Biaxin (Clarithromycin) 11)  Hydrocodone-Acetaminophen (Hydrocodone-Acetaminophen)  Past History:  Past Surgical History: Last updated: 06/08/2008 Thyroidectomy partial Coronary artery bypass graft Cholecystectomy arthroscopy to knee Cervical fusion Cervical laminectomy 05/26/06 athroscopy right knee 04/2008  daldorf  Family History: Last updated: 02/28/2009 father Family History of CAD Male 1st degree relative <50 mother passed a 76 No FH of Colon Cancer:  Social History: Last updated: 05/01/2009 Married Former Smoker, quit in 1994 x 35 years avg. >1 ppd Retired Alcohol Use - yes 2 per week Daily Caffeine Use Illicit Drug Use - no Exercise regular  Risk Factors: Caffeine Use: 5+ (02/21/2007) Exercise: no (02/21/2007)  Risk Factors: Smoking Status: quit (04/22/2009) Passive Smoke Exposure: no (04/22/2009)  Past Medical History: Asthma......Marland KitchenDr Charna Archer > dxed 2002. On QVAR since then. REcollects PFTs/spiro but that was normal Coronary artery disease.........Marland KitchenDr Antoine Poche > S/p CABG 2001, Normal perfusion study 2006. 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 >1 ppd Retired Alcohol Use - yes 2 per week Daily Caffeine Use Illicit Drug Use - no Exercise regular  Review of Systems       The patient complains of shortness of breath with activity, shortness of breath at rest, non-productive cough, chest pain, sore throat, and change in color of mucus.  The patient denies productive cough, coughing up blood, irregular heartbeats, acid heartburn, indigestion, loss of appetite, weight change, abdominal pain, difficulty swallowing,  tooth/dental problems, headaches, nasal congestion/difficulty breathing through nose, sneezing, itching, ear ache, anxiety, depression, hand/feet swelling, joint stiffness or pain, rash, and fever.    Vital Signs:  Patient profile:   70 year old male Height:      66 inches Weight:      200.50 pounds BMI:     32.48 O2 Sat:      94 % on Room air Temp:     97.3 degrees F oral Pulse rate:   97 / minute BP sitting:   108 / 70  (right arm) Cuff size:   regular  Vitals Entered By: Carron Curie CMA (May 01, 2009 8:59 AM)  O2 Flow:  Room air CC: Pulmonary Consult for increased SOB, chest pain and congestion x 3 weeks.  Comments Medications reviewed with patient Carron Curie CMA  May 01, 2009 9:04 AM Daytime phone number verified with patient.    Physical Exam  General:  well developed, well nourished, in no acute distress. obese.   Head:  normocephalic and atraumatic Eyes:  PERRLA/EOM intact; conjunctiva and sclera clear Ears:  TMs intact and clear with normal canals Nose:  no deformity, discharge, inflammation, or lesions Mouth:  no deformity or lesions. Mild oral thrush + Neck:  no  masses, thyromegaly, or abnormal cervical nodes scar in neck + Chest Wall:  no deformities noted cabg scar + Lungs:  clear bilaterally to auscultation and percussion Heart:  regular rate and rhythm, S1, S2 without murmurs, rubs, gallops, or clicks Abdomen:  bowel sounds positive; abdomen soft and non-tender without masses, or organomegaly Msk:  no deformity or scoliosis noted with normal posture Pulses:  pulses normal Extremities:  no clubbing, cyanosis, edema, or deformity noted Neurologic:  CN II-XII grossly intact with normal reflexes, coordination, muscle strength and tone Skin:  intact without lesions or rashes Cervical Nodes:  no significant adenopathy Axillary Nodes:  no significant adenopathy Psych:  alert and cooperative; normal mood and affect; normal attention span and  concentration   CXR  Procedure date:  04/12/2009  Findings:      CHEST - 2 VIEW    Comparison: 04/27/2006    Findings: Sternal wire sutures and mediastinal clips (CABG).  Plate   screw fixation hardware lower cervical spine.  Normal   cardiomediastinal silhouette size and contours.  Mild chronic   bronchitic markings.  No vascular congestion.  No edema or   infiltrate.    IMPRESSION:   No active chest disease.  Mild chronic bronchitic changes.  CABG.    Read By:  Jonne Ply,  M.D.   Released By:  Jonne Ply,  M.D.   Comments:      reviuewed and agree  Impression & Recommendations:  Problem # 1:  ASTHENIA (ICD-780.79) Assessment New I think he has post viral flu like illness with initaal asthma exacerbation and with associated malaise and asthenia that can be prolonged at times lasting several weeks. THis is on the mend and reassuring. I have reassured him about this and will see him again in 3-4 weeks  Problem # 2:  THRUSH (ICD-112.0) Assessment: New Has mild oral thrush that can account for change in voice  plan nystatin swish and swallow x 5 days rov 3-4 weeks  Problem # 3:  CHEST PAIN-UNSPECIFIED (ICD-786.50) Assessment: New He is having chest pain that has many typical elements of angina. This is associated with the viral illness and has improved significantly with improvement of illness. He is very certain pain is not cardiac. However, I have told him that from  a risk stand point we cannot assume that and is best he sees someone at [facility] cardiology asap. he agrees  Problem # 4:  SHORTNESS OF BREATH (SOB) (ICD-786.05) Assessment: New AGain, I am not sure if this is part of general fatigue associated with viral illness that is better now. There is no evidence for pna, chf, asthma attack so far. I will monitor this. ROV 3-4 weeks with PFTs  Problem # 5:  ASTHMA (ICD-493.90) Assessment: Comment Only On QVAR for this since 2002. SAys prior PFTs  were normal. AT followup I will do full PFT and reassess severity and meds. Currently not inexacerbation  Medications Added to Medication List This Visit: 1)  Nystatin 100000 Unit/ml Susp (Nystatin) .... Take one teaspoonful by mouth swish & swallow four times a day x 5 days  Other Orders: Cardiology Referral (Cardiology)  Patient Instructions: 1)  I think you are recovering from post viral illness weakness 2)  You have thrush 3)  Your chest pain is a bit worrisome - we need to make sure is not heart 4)  Please use nystatin swish and swallow as directed 5)  See [facility] cards ASAP - will make appt 6)  Return  to see me in 3-4 weeks 7)  Have full PFTs when you return so we assess your asthma Prescriptions: NYSTATIN 100000 UNIT/ML  SUSP (NYSTATIN) Take one teaspoonful by mouth swish & swallow four times a day x 5 days  #1 x 0   Entered and Authorized by:   Kalman Shan MD   Signed by:   Kalman Shan MD on 05/01/2009   Method used:   Electronically to        [facility] (retail)       [address]       [place], [place]  [address]       Ph: [phone]       Fax: [phone]   RxID:   [id number]     Appended Document: PULM CONGESTION///kp     Allergies: 1)  ! Vicodin (Hydrocodone-Acetaminophen) 2)  ! Bromfenex 3)  ! Doxycycline Hyclate (Doxycycline Hyclate) 4)  ! Ketek (Telithromycin) 5)  ! Promethazine Hcl (Promethazine Hcl) 6)  ! Provigil (Modafinil) 7)  ! Codeine 8)  Sulfa 9)  Ery-Tab (Erythromycin Base) 10)  Biaxin (Clarithromycin) 11)  Hydrocodone-Acetaminophen (Hydrocodone-Acetaminophen)   Other Orders: Consultation Level V (47425)

## 2010-04-22 NOTE — Assessment & Plan Note (Signed)
Summary: F/U Abd pain, constipation, saw RNP    History of Present Illness Visit Type: follow up  Primary GI MD: Sheryn Bison MD FACP FAGA Primary Provider: Darryll Capers, MD Requesting Provider: n/a Chief Complaint: F/u from office visit with NP for constipation and abd pain. 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 He responded well to Benefiber and MiraLax and probiotic therapy has been asymptomatic until today when he again complains of lower abdominal pain with mild malaise. All lab data was reviewed and was normal except for an elevated CRP 12.5 normal less than 5. He does have extensive diverticulosis per her previous colonoscopy. He does take daily and says the form of Meloxicam 15 mg a day for degenerative arthritis. He is also 20 different medications listed and reviewed in his chart which include Prilosec b.i.d. for acid reflux, and a variety of vitamin supplements. Has multiple multiple drug allergies to antibiotics and analgesics.   GI Review of Systems    Reports abdominal pain and  bloating.     Location of  Abdominal pain: generalized.    Denies acid reflux, belching, chest pain, dysphagia with liquids, dysphagia with solids, heartburn, loss of appetite, nausea, vomiting, vomiting blood, weight loss, and  weight gain.      Reports irritable bowel syndrome.     Denies anal fissure, black tarry stools, change in bowel habit, constipation, diarrhea, diverticulosis, fecal incontinence, heme positive stool, hemorrhoids, jaundice, light color stool, liver problems, rectal bleeding, and  rectal pain.    Current Medications (verified): 1)  Crestor 10 Mg Tabs (Rosuvastatin Calcium) .... Take 1 Tablet By Mouth Q Hs 2)  Benicar Hct 20-12.5 Mg Tabs  (Olmesartan Medoxomil-Hctz) .[name].. 1 Once Daily 3)  Lunesta 2 Mg  Tabs (Eszopiclone) .... One By Mouth Q Hs Prn 4)  Clarinex 5 Mg  Tabs (Desloratadine) .... One By Mouth Daily 5)  Bl Adult Aspirin Low Strength 81 Mg  Chew (Aspirin) .... Once Daily 6)  Vitamin B-12 100 Mcg  Tabs (Cyanocobalamin) .... One P.o. Daily Over-The-Counter 7)  Flonase 50 Mcg/act Susp (Fluticasone Propionate) .... 2 Spray Each Nostril Once Daily 8)  Folic Acid 1 Mg  Tabs (Folic Acid) .... Once Daily 9)  Mirapex 0.125 Mg  Tabs (Pramipexole Dihydrochloride) .... One At Bedtime As Needed 10)  Valium 5 Mg  Tabs (Diazepam) .... At Bedtime As Needed 11)  Meloxicam 15 Mg  Tabs (Meloxicam) .... One By Mouth Daily 12)  Prilosec 20 Mg Cpdr (Omeprazole) .... One By Mouth Two Times A Day With Samples 13)  Lac-Hydrin Five 5 % Lotn (Ammonium Lactate) .... Apply To Affected Area Two Times A Day 14)  Qvar 80 Mcg/act Aers (Beclomethasone Dipropionate) .... Two Puffs By Mouth Daily 15)  Proair Hfa 108 (90 Base) Mcg/act  Aers (Albuterol Sulfate) .... Wo Pudd By Mouth  Q 6 Hour Prn 16)  Align  Caps (Probiotic Product) .... One Tablet By Mouth Once Daily 17)  Healthy Hearing Multivitamin .... Once Daily 18)  Miralax  Powd (Polyethylene Glycol 3350) .... Take 1 Capful Wih 8 Oz of Water or Juice Daily 19)  Benefiber  Powd (Wheat Dextrin) .... Mix Powder in Water. 20)  Hyomax-Sl 0.125 Mg Subl (Hyoscyamine Sulfate) .... Take 1 Tab Every 4 Hours  As Needed For Pain  Allergies (verified): 1)  ! Vicodin (Hydrocodone-Acetaminophen) 2)  ! Bromfenex 3)  ! Doxycycline Hyclate (Doxycycline Hyclate) 4)  ! Ketek (Telithromycin) 5)  ! Promethazine Hcl (Promethazine Hcl) 6)  ! Provigil (Modafinil) 7)  ! Codeine 8)  Sulfa 9)  Ery-Tab (Erythromycin Base) 10)  Biaxin (Clarithromycin) 11)  Hydrocodone-Acetaminophen (Hydrocodone-Acetaminophen)  Past History:  Past medical, surgical, family and social histories (including risk factors) reviewed for  relevance to current acute and chronic problems.  Past Medical History: Reviewed history from 11/06/2008 and no changes required. 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 Thyroidectomy partial Coronary artery bypass graft Cholecystectomy arthroscopy to knee Cervical fusion Cervical laminectomy 05/26/06 athroscopy right knee 04/2008  daldorf  Family History: Reviewed history from 06/28/2007 and no changes required. father Family History of CAD Male 1st degree relative <50 mother passed a 69 No FH of Colon Cancer:  Social History: Reviewed history from 11/06/2008 and no changes required. Married Former Smoker Retired Alcohol Use - yes 2 per week Daily Caffeine Use Illicit Drug Use - no  Review of Systems       The patient complains of allergy/sinus and fatigue.  The patient denies anemia, anxiety-new, arthritis/joint pain, back pain, blood in urine, breast changes/lumps, change in vision, confusion, cough, coughing up blood, depression-new, fainting, fever, headaches-new, hearing problems, heart murmur, heart rhythm changes, itching, muscle pains/cramps, night sweats, nosebleeds, shortness of breath, skin rash, sleeping problems, sore throat, swelling of feet/legs, swollen lymph glands, thirst - excessive, urination - excessive, urination changes/pain, urine leakage, vision changes, and voice change.    Vital Signs:  Patient profile:   70 year old male Height:      66 inches Weight:      200 pounds BMI:     32.40 BSA:     2.00 Pulse rate:   88 / minute Pulse rhythm:   regular BP sitting:   134 / 80  (left arm) Cuff size:   regular  Vitals Entered By: Ok Anis CMA (February 28, 2009 11:09 AM)  Physical  Exam  General:  Well developed, well nourished, no acute distress.healthy appearing.   Head:  Normocephalic and atraumatic. Eyes:  PERRLA, no icterus.exam deferred to patient's ophthalmologist.   Lungs:  Clear throughout to auscultation. Heart:  Regular rate and rhythm; no murmurs, rubs,  or bruits. Abdomen:  His abdomen is not distended but they're very active bowel sounds present. I cannot appreciate organomegaly or masses but he does have moderate tenderness over the sigmoid colon the left lower quadrant area. Rectal:  deferred Extremities:  No clubbing, cyanosis, edema or deformities noted. Neurologic:  Alert and  oriented x4;  grossly normal neurologically. Skin:  Intact without significant lesions or rashes. Psych:  Alert and cooperative. Normal mood and affect.   Impression & Recommendations:  Problem # 1:  CONSTIPATION (ICD-564.00) Assessment Improved continue Benefiber and MiraLax with liberal p.o. fluids  Problem # 2:  ABDOMINAL PAIN -GENERALIZED (ICD-789.07) Assessment: Deteriorated His clinical course, elevated CRP, and left lower quadrant tenderness most consistent with subacute diverticulitis. I placed him on Cipro 500 mg twice a day for 10 days and he is to call if he does not improve or worsens and we will check abdominal pelvic CT scan. He does have chronic underlying irritable bowel syndrome.  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 I have not stopped NSAIDs at this time but this may need to be done.  Problem # 4:  HYPERTENSION (ICD-401.9) Assessment: Improved Blood Pressure Today is 134/80 and advised to continue all his other medications as per Dr. Lovell Sheehan  Problem # 5:  ADVERSE DRUG REACTION (ICD-995.20) Assessment: Unchanged This patient has  multiple drug allergies listed and reviewed in his chart which include multiple narcotics and antibiotics. These also include promethazine, Provigil, erythromycin and sulfur medications.  Patient Instructions: 1)  Copy sent to : Dr. Darryll Capers 2)  Please continue current medications.  3)  Diet should be high in fiber ( fruits, vegetables, whole grains) but low in residue. Drink at least eight (8) glasses of water a day.  4)  Diverticular Disease brochure given.  5)  Cipro 500 mg twice a day for 10 days. 6)  Please call our GI Office back in 2 weeks with a follow-up of symptoms.  Appended Document: F/U Abd pain, constipation, saw RNP    Clinical Lists Changes  Medications: Added new medication of CIPROFLOXACIN HCL 500 MG  TABS (CIPROFLOXACIN HCL) Take 1 twice a day times 10 days - Signed Rx of CIPROFLOXACIN HCL 500 MG  TABS (CIPROFLOXACIN HCL) Take 1 twice a day times 10 days;  #20 x 0;  Signed;  Entered by: Ashok Cordia RN;  Authorized by: Mardella Layman MD [facility];  Method used: Electronically to [facility] [address], [place], [place], [place]  [address], Ph: [phone], Fax: [phone]    Prescriptions: CIPROFLOXACIN HCL 500 MG  TABS (CIPROFLOXACIN HCL) Take 1 twice a day times 10 days  #20 x 0   Entered by:   Ashok Cordia RN   Authorized by:   Mardella Layman MD [facility]   Signed by:   Ashok Cordia RN on 02/28/2009   Method used:   Electronically to        [facility] (retail)       [address]       [place], [place]  [address]       Ph: [phone]       Fax: [phone]   RxID:   [id number]

## 2010-04-22 NOTE — Letter (Signed)
Summary: Patient Notice-Endo Biopsy Results  [facility]  [address] [place], [place] [address]   Phone: [phone]  Fax: [phone]        December 13, 2008 MRN: [id number]    Joshua [name] [address] [place], [place]  [address]    Dear [name],  I am pleased to inform you that the biopsies taken during your recent endoscopic examination did not show any evidence of cancer upon pathologic examination.  Additional information/recommendations:  __No further action is needed at this time.  Please follow-up with      your primary care physician for your other healthcare needs.  __ Please call [phone] to schedule a return visit to review      your condition.  _x_ Continue with the treatment plan as outlined on the day of your      exam.  __ You should have a repeat endoscopic examination for this problem              in _ months/years.   Please call us if you are having persistent problems or have questions about your condition that have not been fully answered at this time.  Sincerely,  Mardella Layman MD [facility]  This letter has been electronically signed by your physician.

## 2010-04-22 NOTE — Progress Notes (Signed)
Summary: no better  Phone Note Call from Patient   Caller: Patient Call For: Stacie Glaze MD Summary of Call: Pt is still having heaviness in chest and SOB on exertion.  Is not happy with how he is progressing. Please advise.  [phone]  Initial call taken by: Lynann Beaver CMA,  April 26, 2009 8:46 AM  Follow-up for Phone Call        persistant respiratory issues REFERRAL TO [facility] Follow-up by: Stacie Glaze MD,  April 26, 2009 9:17 AM  Additional Follow-up for Phone Call Additional follow up Details #1::        Pt notified and order sent to [facility]. Additional Follow-up by: Lynann Beaver CMA,  April 26, 2009 9:25 AM

## 2010-04-22 NOTE — Progress Notes (Signed)
Summary: refill  Phone Note Call from Patient Call back at Home Phone [phone]   Caller: pt  Call For: Lovell Sheehan Summary of Call: Crestor 10mg  [facility] 90 day rx .  Initial call taken by: Roselle Locus,  Aug 11, 2007 10:19 AM      Prescriptions: CRESTOR 10 MG TABS (ROSUVASTATIN CALCIUM) Take 1 tablet by mouth q hs  #90 x 3   Entered by:   Willy Eddy, LPN   Authorized by:   Stacie Glaze MD   Signed by:   Willy Eddy, LPN on 09/81/1914   Method used:   Electronically sent to ...       [facility]       [address]       [place], [place]  [address]       Ph: [phone]       Fax: [phone]   RxID:   [id number]

## 2010-04-22 NOTE — Progress Notes (Signed)
Summary: needs call  Phone Note Call from Patient Call back at [phone]   Caller: vm Call For: jenkins Action Taken: Phone Call Completed Summary of Call: Did [facility] request Rx for Flonase for me? Initial call taken by: Rudy Jew, RN,  November 20, 2008 3:03 PM    Prescriptions: Aleda Grana 50 MCG/ACT SUSP (FLUTICASONE PROPIONATE) 2 spray each nostril once daily  #90 DAY x 3   Entered by:   Rudy Jew, RN   Authorized by:   Stacie Glaze MD   Signed by:   Rudy Jew, RN on 11/20/2008   Method used:   Electronically to        [facility]* (mail-order)             ,          Ph: [phone]       Fax: [phone]   RxID:   [id number]

## 2010-04-22 NOTE — Progress Notes (Signed)
Summary: not any better  Phone Note Call from Patient Call back at Home Phone [phone]   Caller: Patient-------voicemail Summary of Call: Still not feeling all that good. Not coughing as much. Still taking the abx and prednisone. Should he see a doctor.  He has been like this for 2 weeks. Initial call taken by: Warnell Forester,  April 19, 2009 11:19 AM  Follow-up for Phone Call        appoint for next week or with anyone who has opening  Follow-up by: Stacie Glaze MD,  April 19, 2009 12:04 PM  Additional Follow-up for Phone Call Additional follow up Details #1::        pt will see dr Lovell Sheehan on 04-22-09. Additional Follow-up by: Warnell Forester,  April 19, 2009 1:22 PM

## 2010-04-22 NOTE — Progress Notes (Signed)
Summary: refill  Phone Note Call from Patient Call back at Home Phone [phone]   Caller: pt live Call For: Lovell Sheehan Summary of Call: [facility] [address] Nasonex.  3 months if possible  Initial call taken by: Roselle Locus,  October 24, 2007 12:26 PM      Prescriptions: NASONEX 50 MCG/ACT  SUSP (MOMETASONE FUROATE) two sprays q nostril daily  #3 vials x 3   Entered by:   Willy Eddy, LPN   Authorized by:   Stacie Glaze MD   Signed by:   Willy Eddy, LPN on 09/81/1914   Method used:   Electronically sent to ...       [facility] [address]       [address]       [place], [place]  [address]       Ph: [phone]       Fax: [phone]   RxID:   [id number]

## 2010-04-22 NOTE — Letter (Signed)
Summary: [facility]  [facility]   Imported By: Maryln Gottron 07/31/2008 10:14:11  _____________________________________________________________________  External Attachment:    Type:   Image     Comment:   External Document

## 2010-04-22 NOTE — Progress Notes (Signed)
Summary: pain in left toe joint   Phone Note Call from Patient Call back at Home Phone [phone]   Caller: patient triage message Call For: Lovell Sheehan Summary of Call: Pain in left toe joint (where big toe meets foot).  Both feet are very cold and now both feet are tingling and hurting.  Pain is very localized. Hurts to walk on it Not swelling  Initial call taken by: Roselle Locus,  May 13, 2007 9:02 AM  Follow-up for Phone Call        if not warm or red it is not classically gout... possible just OA  apply heat for 15 min, usew topical capsacian OTC cream Follow-up by: Stacie Glaze MD,  May 13, 2007 10:34 AM  Additional Follow-up for Phone Call Additional follow up Details #1::        Pt. given Dr. Ernie Avena recommendations. Additional Follow-up by: Lynann Beaver CMA,  May 13, 2007 11:33 AM

## 2010-04-22 NOTE — Assessment & Plan Note (Signed)
Summary: cpx/njr   Vital Signs:  Patient profile:   70 year old male Height:      70 inches Weight:      189 pounds BMI:     27.22 Temp:     98.2 degrees F oral Pulse rate:   76 / minute Resp:     14 per minute BP sitting:   132 / 80  (left arm)  Vitals Entered By: Willy Eddy, LPN (September 18, 2008 8:46 AM)  Nutrition Counseling: Patient's BMI is greater than 25 and therefore counseled on weight management options.  CC:  cpx- flex in 2003- no colon.  History of Present Illness: still very lethargic and tire but the abdominla pain has decreased with the pud therapy here for follow up of chronic medical problems and all health care protocols reviewed pt still has mild abd pain but the stomach is less distended   Hypertension History:      He denies headache, chest pain, palpitations, dyspnea with exertion, orthopnea, PND, peripheral edema, visual symptoms, neurologic problems, syncope, and side effects from treatment.        Positive major cardiovascular risk factors include male age 103 years old or older, hyperlipidemia, and hypertension.  Negative major cardiovascular risk factors include negative family history for ischemic heart disease and non-tobacco-user status.        Positive history for target organ damage include ASHD (either angina/prior MI/prior CABG).     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 Take 1 Tablet By Mouth Q Hs 2)  Benicar Hct 20-12.5 Mg Tabs (Olmesartan Medoxomil-Hctz) .[name].. 1 Once Daily 3)  Pulmicort Flexhaler 90 Mcg/act  Inha (Budesonide (Inhalation)) .... One Puff P.o. Bid 4)  Lunesta 2 Mg  Tabs (Eszopiclone) .... One By Mouth Q Hs Prn 5)  Clarinex 5 Mg  Tabs (Desloratadine) .... One By Mouth Daily 6)  Bl Adult Aspirin Low Strength 81 Mg  Chew (Aspirin) .... Once Daily 7)  Vitamin B-12 100 Mcg  Tabs (Cyanocobalamin) .... One P.o. Daily Over-The-Counter 8)  Nasonex 50 Mcg/act  Susp (Mometasone Furoate) .... Two Sprays Q Nostril Daily 9)  Folic Acid 1 Mg  Tabs (Folic Acid) .... Once Daily 10)  Mirapex 0.125 Mg  Tabs (Pramipexole Dihydrochloride) .... One At Bedtime As Needed 11)  Valium 5 Mg  Tabs (Diazepam) .... At Bedtime As Needed 12)  Meloxicam 15 Mg  Tabs (Meloxicam) .... One By Mouth Daily 13)  Prilosec 20 Mg Cpdr (Omeprazole) .... One By Mouth Two Times A Day With Samples 14)  Lac-Hydrin Five 5 % Lotn (Ammonium Lactate) .... Apply To Affected Area Two Times A Day 15)  Astepro 0.15 % Soln (Azelastine Hcl) .Marland KitchenMarland KitchenMarland Kitchen  Two Sprays Q Nostril Q Hs  Current Medications (verified): 1)  Crestor 10 Mg Tabs (Rosuvastatin Calcium) .... Take 1 Tablet By Mouth Q Hs 2)  Benicar Hct 20-12.5 Mg Tabs (Olmesartan Medoxomil-Hctz) .[name].. 1 Once Daily 3)  Pulmicort Flexhaler 90 Mcg/act  Inha (Budesonide (Inhalation)) .... One Puff P.o. Bid 4)  Lunesta 2 Mg  Tabs (Eszopiclone) .... One By Mouth Q Hs Prn 5)  Clarinex 5 Mg  Tabs (Desloratadine) .... One By Mouth Daily 6)  Bl Adult Aspirin Low Strength 81 Mg  Chew (Aspirin) .... Once Daily 7)  Vitamin B-12 100 Mcg  Tabs (Cyanocobalamin) .... One P.o. Daily Over-The-Counter 8)  Flonase 50 Mcg/act Susp  (Fluticasone Propionate) .... 2 Spray Each Nostril Once Daily 9)  Folic Acid 1 Mg  Tabs (Folic Acid) .... Once Daily 10)  Mirapex 0.125 Mg  Tabs (Pramipexole Dihydrochloride) .... One At Bedtime As Needed 11)  Valium 5 Mg  Tabs (Diazepam) .... At Bedtime As Needed 12)  Meloxicam 15 Mg  Tabs (Meloxicam) .... One By Mouth Daily 13)  Prilosec 20 Mg Cpdr (Omeprazole) .... One By Mouth Two Times A Day With Samples 14)  Lac-Hydrin Five 5 % Lotn (Ammonium Lactate) .... Apply To Affected Area Two Times A Day 15)  Astepro 0.15 % Soln (Azelastine Hcl) .... Two Sprays Q Nostril Q Hs  Allergies (verified): 1)  ! Vicodin (Hydrocodone-Acetaminophen) 2)  ! Bromfenex 3)  ! Doxycycline Hyclate (Doxycycline Hyclate) 4)  ! Ketek (Telithromycin) 5)  ! Promethazine Hcl (Promethazine Hcl) 6)  ! Provigil (Modafinil) 7)  Sulfa 8)  Ery-Tab (Erythromycin Base) 9)  Biaxin (Clarithromycin) 10)  Hydrocodone-Acetaminophen (Hydrocodone-Acetaminophen)  Past History:  Family History: Last updated: 06/28/2007 father Family History of CAD Male 1st degree relative <50 mother passed a 47  Social History: Last updated: 06/28/2007 Married Former Smoker Retired  Risk Factors: Caffeine Use: 5+ (02/21/2007) Exercise: no (02/21/2007)  Risk Factors: Smoking Status: quit (11/02/2006) Passive Smoke Exposure: no (02/21/2007)  Past medical, surgical, family and social histories (including risk factors) reviewed, and no changes noted (except as noted below).  Past Medical History: Reviewed history from 02/21/2007 and no changes required. 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 Thyroidectomy partial Coronary artery bypass graft Cholecystectomy arthroscopy to knee Cervical fusion Cervical laminectomy 05/26/06 athroscopy right knee 04/2008   daldorf  Family History: Reviewed history from 06/28/2007 and no changes required. father Family History of CAD Male 1st degree relative <50 mother passed a 47  Social History: Reviewed history from 06/28/2007 and no changes required. Married Former Smoker Retired  Review of Systems  The patient denies anorexia, fever, weight loss, weight gain, vision loss, decreased hearing, hoarseness, chest pain, syncope, dyspnea on exertion, peripheral edema, prolonged cough, headaches, hemoptysis, abdominal pain, melena, hematochezia, severe indigestion/heartburn, hematuria, incontinence, genital sores, muscle weakness, suspicious skin lesions, transient blindness, difficulty walking, depression, unusual weight change, abnormal bleeding, enlarged lymph nodes, angioedema, breast masses, and testicular masses.         fatigue is the major component has lost weight from the bloating resolution  Physical Exam  General:  Well-developed,well-nourished,in no acute distress; alert,appropriate and cooperative throughout examination Head:  atraumatic and male-pattern balding.   Eyes:  pupils equal, pupils round, and pupils reactive to light.   Ears:  External ear exam shows no significant lesions or deformities.  Otoscopic examination reveals clear canals, tympanic membranes are  intact bilaterally without bulging, retraction, inflammation or discharge. Hearing is grossly normal bilaterally. Nose:  nose piercing noted and no external erythema.  nasal dischargemucosal pallor and mucosal erythema.   Mouth:  pharynx pink and moist.   Neck:  No deformities, masses, or tenderness noted. Lungs:  Normal respiratory effort, chest expands symmetrically. Lungs are clear to auscultation, no crackles or wheezes. Heart:  Normal rate and regular rhythm. S1 and S2 normal without gallop, murmur, click, rub or other extra sounds. Abdomen:  Bowel sounds positive,abdomen soft and non-tender without masses, organomegaly or  hernias noted. Msk:  No deformity or scoliosis noted of thoracic or lumbar spine.   Pulses:  R and L carotid,radial,femoral,dorsalis pedis and posterior tibial pulses are full and equal bilaterally Extremities:  No clubbing, cyanosis, edema, or deformity noted with normal full range of motion of all joints.   Neurologic:  alert & oriented X3, cranial nerves II-XII intact, and strength normal in all extremities.     Impression & Recommendations:  Problem # 1:  ABDOMINAL PAIN, EPIGASTRIC (ICD-789.06)  Discussed symptom control with the patient.  continue the prilosec 20 mg by mouth BID  Problem # 2:  HYPERTENSION (ICD-401.9)  His updated medication list for this problem includes:    Benicar Hct 20-12.5 Mg Tabs (Olmesartan medoxomil-hctz) .[name]... 1 once daily  BP today: 132/80 Prior BP: 126/80 (06/08/2008)  Prior 10 Yr Risk Heart Disease: N/A (12/14/2006)  Labs Reviewed: K+: 4.5 (04/28/2007) Creat: : 1.44 (04/28/2007)   Chol: 144 (04/28/2007)   HDL: 43 (04/28/2007)   LDL: 74 (04/28/2007)   TG: 137 (04/28/2007)  Problem # 3:  SYMPTOM, MEMORY LOSS (ICD-780.93) discussion of depression monitering of low sodium could this be related to the bloating and he loss of weight ( fluid) ?siadh?  Problem # 4:  SYMPTOM, HYPERSOMNIA W/SLEEP APNEA NOS (ICD-780.53)  Problem # 5:  CORONARY ARTERY DISEASE (ICD-414.00)  His updated medication list for this problem includes:    Benicar Hct 20-12.5 Mg Tabs (Olmesartan medoxomil-hctz) .[name]... 1 once daily    Bl Adult Aspirin Low Strength 81 Mg Chew (Aspirin) ..... Once daily  Labs Reviewed: Chol: 144 (04/28/2007)   HDL: 43 (04/28/2007)   LDL: 74 (04/28/2007)   TG: 137 (04/28/2007)  Complete Medication List: 1)  Crestor 10 Mg Tabs (Rosuvastatin calcium) .... Take 1 tablet by mouth q hs 2)  Benicar Hct 20-12.5 Mg Tabs (Olmesartan medoxomil-hctz) .[name].. 1 once daily 3)  Pulmicort Flexhaler 90 Mcg/act Inha (Budesonide (inhalation)) .... One puff p.o.  bid 4)  Lunesta 2 Mg Tabs (Eszopiclone) .... One by mouth q hs prn 5)  Clarinex 5 Mg Tabs (Desloratadine) .... One by mouth daily 6)  Bl Adult Aspirin Low Strength 81 Mg Chew (Aspirin) .... Once daily 7)  Vitamin B-12 100 Mcg Tabs (Cyanocobalamin) .... One p.o. daily over-the-counter 8)  Flonase 50 Mcg/act Susp (Fluticasone propionate) .... 2 spray each nostril once daily 9)  Folic Acid 1 Mg Tabs (Folic acid) .... Once daily 10)  Mirapex 0.125 Mg Tabs (Pramipexole dihydrochloride) .... One at bedtime as needed 11)  Valium 5 Mg Tabs (Diazepam) .... At bedtime as needed 12)  Meloxicam 15 Mg Tabs (Meloxicam) .... One by mouth daily 13)  Prilosec 20 Mg Cpdr (Omeprazole) .... One by mouth two times a day with samples 14)  Lac-hydrin Five 5 % Lotn (Ammonium lactate) .... Apply to affected area two times a day 15)  Astepro 0.15 % Soln (Azelastine hcl) .... Two sprays q nostril q hs  Hypertension Assessment/Plan:      The patient's hypertensive risk group is category C: Target organ damage and/or diabetes.  Today's blood pressure is 132/80.  His blood pressure goal is < 140/90.  Patient Instructions: 1)  Please schedule a follow-up appointment in 1 months. Prescriptions: FLONASE 50 MCG/ACT SUSP (FLUTICASONE PROPIONATE) 2 spray each nostril once daily  #1 x 6   Entered by:   Willy Eddy, LPN   Authorized by:   Stacie Glaze MD   Signed by:   Willy Eddy, LPN on 46/96/2952   Method used:   Electronically to        [facility] [address] (retail)       [address]       [place], [place]  [address]       Ph: [phone]       Fax: [phone]   RxID:   [id number]    Immunization History:  Pneumovax Immunization History:    Pneumovax:  historical (06/04/2006)

## 2010-04-22 NOTE — Letter (Signed)
Summary: [facility]   Imported By: Maryln Gottron 05/22/2008 15:56:22  _____________________________________________________________________  External Attachment:    Type:   Image     Comment:   External Document

## 2010-04-22 NOTE — Miscellaneous (Signed)
  Clinical Lists Changes  Observations: Added new observation of NUCLEAR NOS: Exercise Capacity: Good exercise capacity. BP Response: Normal blood pressure response. Clinical Symptoms: There is chest pain ECG Impression: No significant ST segment change suggestive of ischemia. Overall Impression: There is no sign of scar or ischemia.   (05/14/2009 12:05)      Nuclear Study  Procedure date:  05/14/2009  Findings:      Exercise Capacity: Good exercise capacity. BP Response: Normal blood pressure response. Clinical Symptoms: There is chest pain ECG Impression: No significant ST segment change suggestive of ischemia. Overall Impression: There is no sign of scar or ischemia.

## 2010-04-22 NOTE — Progress Notes (Signed)
Summary: flonase rx   Phone Note Call from Patient   Summary of Call: pt called and wanted to know if he could get a prescription for Flonase the Nasonex is going to be to expensive. He wanted to let you know that he is doing much better and not having any pain. [facility] [address] Pt's # [phone] Initial call taken by: Army Fossa CMA,  September 28, 2008 8:55 AM  Follow-up for Phone Call        ok per dr Lovell Sheehan Follow-up by: Willy Eddy, LPN,  September 28, 8293 10:54 AM

## 2010-04-22 NOTE — Assessment & Plan Note (Signed)
Summary: elevated BP/dm   Vital Signs:  Patient profile:   70 year old male Temp:     98.0 degrees F oral BP sitting:   138 / 82  (left arm) BP standing:   128 / 72 Cuff size:   regular  Vitals Entered By: Sid Falcon LPN (Aug 16, 2009 1:24 PM)  Serial Vital Signs/Assessments:  Time      Position  BP       Pulse  Resp  Temp     By           Sitting   130/80                         Evelena Peat MD           Standing  128/72                         Evelena Peat MD  CC: Concerned about BP   History of Present Illness: Patient here for followup hypertension-seen as a work in. Recently had hydrochlorothiazide added back to Benicar. Tolerating well. He was concerned because of some systolic blood pressures 130s to 140s range with diastolics 80s to 90s. Overall he feels well. No dizziness, headaches, chest pain, or palpitations. He is trying to reduce sodium intake.  Allergies: 1)  ! Vicodin (Hydrocodone-Acetaminophen) 2)  ! Bromfenex 3)  ! Doxycycline Hyclate (Doxycycline Hyclate) 4)  ! Ketek (Telithromycin) 5)  ! Promethazine Hcl (Promethazine Hcl) 6)  ! Provigil (Modafinil) 7)  ! Codeine 8)  Sulfa 9)  Ery-Tab (Erythromycin Base) 10)  Biaxin (Clarithromycin) 11)  Hydrocodone-Acetaminophen (Hydrocodone-Acetaminophen)  Past History:  Past Medical History: Last updated: 06/10/2009 Asthma......Marland KitchenDr Charna Archer > dxed 2002. On QVAR since then. REcollects PFTs/spiro but that was normal Coronary artery disease.........Marland KitchenDr Antoine Poche > S/p CABG 2001, Normal perfusion study 2011 Depression GERD Hyperlipidemia IBS medial meniscal tear degerative disc dz Hypertension Anemia Arthritis Diverticulosis Obesity Pancreatitis Peptic Ulcer Disease Pneumonia Sleep Apnea >Mild on Sleep study Nov 2009, AHI Nov 2009  Review of Systems      See HPI  Physical Exam  General:  Well-developed,well-nourished,in no acute distress; alert,appropriate and cooperative throughout  examination Lungs:  Normal respiratory effort, chest expands symmetrically. Lungs are clear to auscultation, no crackles or wheezes. Heart:  normal rate and regular rhythm.   Extremities:  no edema   Impression & Recommendations:  Problem # 1:  HYPERTENSION (ICD-401.9) continue current medications.  Adequate control by readings here today. His updated medication list for this problem includes:    Benicar Hct 20-12.5 Mg Tabs (Olmesartan medoxomil-hctz) .[name]... 1 once daily  Complete Medication List: 1)  Crestor 10 Mg Tabs (Rosuvastatin calcium) .... Take 1 tablet by mouth q hs 2)  Benicar Hct 20-12.5 Mg Tabs (Olmesartan medoxomil-hctz) .[name].. 1 once daily 3)  Clarinex 5 Mg Tabs (Desloratadine) .... One by mouth daily 4)  Bl Adult Aspirin Low Strength 81 Mg Chew (Aspirin) .... Once daily 5)  Vitamin B-12 100 Mcg Tabs (Cyanocobalamin) .... One p.o. daily over-the-counter 6)  Flonase 50 Mcg/act Susp (Fluticasone propionate) .... 2 spray each nostril once daily 7)  Folic Acid 1 Mg Tabs (Folic acid) .... Once daily 8)  Mirapex 0.125 Mg Tabs (Pramipexole dihydrochloride) .... One at bedtime as needed 9)  Valium 5 Mg Tabs (Diazepam) .... At bedtime as needed 10)  Prilosec 20 Mg Cpdr (Omeprazole) .... One by mouth two times a day with  samples 11)  Lac-hydrin Five 5 % Lotn (Ammonium lactate) .... Apply to affected area two times a day as needed 12)  Qvar 80 Mcg/act Aers (Beclomethasone dipropionate) .... Two puffs by mouth daily 13)  Proair Hfa 108 (90 Base) Mcg/act Aers (Albuterol sulfate) .... Wo pudd by mouth  q 6 hour prn 14)  Benefiber Powd (Wheat dextrin) .... Mix powder in water. 15)  Nitrostat 0.4 Mg Subl (Nitroglycerin) .[name].. 1 tablet under tongue at onset of chest pain; you may repeat every 5 minutes for up to 3 doses. 16)  Miralax Powd (Polyethylene glycol 3350) .... Daily 17)  Align Caps (Probiotic product) .[name].. 1 by mouth daily 18)  Tramadol Hcl 50 Mg Tabs (Tramadol hcl) .... One by  mouth q 6 hours as needed pain noted prior reaction to codine 19)  Promethazine Hcl 25 Mg Tabs (Promethazine hcl) .... One every 6 hours as needed for nausea  Patient Instructions: 1)  Check your  Blood Pressure regularly . If it is above: 140/90  you should make an appointment. 2)  continue followup with Dr. Lovell Sheehan as scheduled

## 2010-04-22 NOTE — Assessment & Plan Note (Signed)
Summary: 2 month rov/njr   Vital Signs:  Patient profile:   70 year old male Height:      70 inches Weight:      202 pounds BMI:     29.09 Temp:     98.4 degrees F oral Pulse rate:   80 / minute Resp:     14 per minute BP sitting:   126 / 80  (left arm)  Vitals Entered By: Willy Eddy, LPN (June 08, 2008 10:06 AM)  CC:  roa.  History of Present Illness: follow up post knee surgery allergy to hydrocodone  ( noted)   Hypertension History:      He denies headache, chest pain, palpitations, dyspnea with exertion, orthopnea, PND, peripheral edema, visual symptoms, neurologic problems, syncope, and side effects from treatment.  He notes problems with antihypertensive medication side effects.        Positive major cardiovascular risk factors include male age 73 years old or older, hyperlipidemia, and hypertension.  Negative major cardiovascular risk factors include negative family history for ischemic heart disease and non-tobacco-user status.        Positive history for target organ damage include ASHD (either angina/prior MI/prior CABG).     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) 5)  Hypertension  (ICD-401.9) 6)  Symptom, Memory Loss  (ICD-780.93) 7)  Sinusitis, Chronic Nos  (ICD-473.9) 8)  Osteoarthrosis Nos, Unspecified Site  (ICD-715.90) 9)  Symptom, Hypersomnia W/sleep Apnea Nos  (ICD-780.53) 10)  Hx of Medial Meniscus Tear  (ICD-836.0) 11)  Hyperlipidemia  (ICD-272.4) 12)  Gerd  (ICD-530.81) 13)  Depression  (ICD-311) 14)  Coronary Artery Disease  (ICD-414.00) 15)  Asthma  (ICD-493.90) 16)  Irritable Bowel Syndrome  (ICD-564.1)  Current Problems (verified): 1)  Medial Epicondylitis, 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) 2)  Preventive Health Care  (ICD-V70.0) 3)  Family History of Cad Male 1st  Degree Relative <50  (ICD-V17.3) 4)  Tinnitus, Chronic, 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 Take 1 Tablet By Mouth Q Hs 2)  Benicar Hct 20-12.5 Mg Tabs (Olmesartan Medoxomil-Hctz) .[name].. 1 Once Daily 3)  Pulmicort Flexhaler 90 Mcg/act  Inha (Budesonide (Inhalation)) .... One Puff P.o. Bid 4)  Lunesta 2 Mg  Tabs (Eszopiclone) .... One By Mouth Q Hs Prn 5)  Clarinex 5 Mg  Tabs (Desloratadine) .... One By Mouth Daily 6)  Bl Adult Aspirin Low Strength 81 Mg  Chew (Aspirin) .... Once Daily 7)  Vitamin B-12 100 Mcg  Tabs (Cyanocobalamin) .... One P.o. Daily Over-The-Counter 8)  Nasonex 50 Mcg/act  Susp (Mometasone Furoate) .... Two Sprays Q Nostril Daily 9)  Folic Acid 1 Mg  Tabs (Folic Acid) .... Once Daily 10)  Mirapex 0.125 Mg  Tabs (Pramipexole Dihydrochloride) .... One At Bedtime As Needed 11)  Valium 5 Mg  Tabs (Diazepam) .... At Bedtime As Needed 12)  Meloxicam 15 Mg  Tabs (Meloxicam) .... One By Mouth Daily 13)  Prilosec 40 Mg  Cpdr (Omeprazole) .... Once Daily As Needed 14)  Lac-Hydrin Five 5 % Lotn (Ammonium Lactate) .... Apply To Affected Area Two Times A Day 15)  Astepro 0.15 % Soln (Azelastine Hcl) .... Two Sprays Q Nostril Q Hs  Current Medications (verified): 1)  Crestor 10 Mg Tabs (Rosuvastatin Calcium) .... Take 1 Tablet By Mouth Q Hs 2)  Benicar Hct 20-12.5 Mg Tabs (Olmesartan Medoxomil-Hctz) .[name].. 1 Once Daily 3)  Pulmicort Flexhaler 90 Mcg/act  Inha  (Budesonide (Inhalation)) .... One Puff P.o. Bid 4)  Lunesta 2 Mg  Tabs (Eszopiclone) .... One By Mouth Q Hs Prn 5)  Clarinex 5 Mg  Tabs (Desloratadine) .... One By Mouth Daily 6)  Bl Adult Aspirin Low Strength 81 Mg  Chew (Aspirin) .... Once Daily 7)  Vitamin B-12 100 Mcg  Tabs (Cyanocobalamin) .... One P.o. Daily Over-The-Counter 8)  Nasonex 50 Mcg/act  Susp (Mometasone Furoate) .... Two Sprays Q Nostril Daily 9)  Folic Acid 1 Mg  Tabs (Folic Acid) .... Once Daily 10)  Mirapex 0.125 Mg  Tabs (Pramipexole Dihydrochloride) .... One At Bedtime As Needed 11)  Valium 5 Mg  Tabs (Diazepam) .... At Bedtime As Needed 12)  Meloxicam 15 Mg  Tabs (Meloxicam) .... One By Mouth Daily 13)  Prilosec 40 Mg  Cpdr (Omeprazole) .... Once Daily As Needed 14)  Lac-Hydrin Five 5 % Lotn (Ammonium Lactate) .... Apply To Affected Area Two Times A Day 15)  Astepro 0.15 % Soln (Azelastine Hcl) .... Two Sprays Q Nostril Q Hs  Allergies (verified): 1)  ! Vicodin (Hydrocodone-Acetaminophen) 2)  ! Bromfenex 3)  ! Doxycycline Hyclate (Doxycycline Hyclate) 4)  ! Ketek (Telithromycin) 5)  ! Promethazine Hcl (Promethazine Hcl) 6)  ! Provigil (Modafinil) 7)  Sulfa 8)  Ery-Tab (Erythromycin Base) 9)  Biaxin (Clarithromycin) 10)  Hydrocodone-Acetaminophen (Hydrocodone-Acetaminophen)  Past History:  Family History:    father    Family History of CAD Male 1st degree relative <50    mother    passed a 76 (06/28/2007)  Social History:    Married    Former Smoker    Retired     (06/28/2007)  Risk Factors:    Alcohol Use: N/A    >5 drinks/d w/in last 3 months: N/A    Caffeine Use: 5+ (02/21/2007)    Diet: N/A    Exercise: no (02/21/2007)  Risk Factors:    Smoking Status: quit (11/02/2006)    Packs/Day: N/A    Cigars/wk: N/A    Pipe Use/wk: N/A    Cans of tobacco/wk: N/A    Passive Smoke Exposure: no (02/21/2007)  Past medical, surgical, family and social histories (including risk factors) reviewed, and  no changes noted (except as noted below).  Past Medical History:    Reviewed history from 02/21/2007 and no changes required:    Asthma    Coronary artery disease    Depression    GERD    Hyperlipidemia    IBS    medial meniscal tear    degerative disc dz    Hypertension  Past Surgical History:    Thyroidectomy partial    Coronary artery bypass graft    Cholecystectomy    arthroscopy to knee    Cervical fusion    Cervical laminectomy 05/26/06    athroscopy right knee 04/2008  [facility]  Family History:  Reviewed history from 06/28/2007 and no changes required:       father       Family History of CAD Male 1st degree relative <50       mother       passed a 94  Social History:    Reviewed history from 06/28/2007 and no changes required:       Married       Former Smoker       Retired  Review of Systems  The patient denies anorexia, fever, weight loss, weight gain, vision loss, decreased hearing, hoarseness, chest pain, syncope, dyspnea on exertion, peripheral edema, prolonged cough, headaches, hemoptysis, abdominal pain, melena, hematochezia, severe indigestion/heartburn, hematuria, incontinence, genital sores, muscle weakness, suspicious skin lesions, transient blindness, difficulty walking, depression, unusual weight change, abnormal bleeding, enlarged lymph nodes, angioedema, and breast masses.    Physical Exam  General:  Well-developed,well-nourished,in no acute distress; alert,appropriate and cooperative throughout examination Eyes:  pupils equal, pupils round, and pupils reactive to light.   Nose:  nose piercing noted and no external erythema.  nasal dischargemucosal pallor and mucosal erythema.   Mouth:  pharynx pink and moist.   Neck:  No deformities, masses, or tenderness noted. Lungs:  Normal respiratory effort, chest expands symmetrically. Lungs are clear to auscultation, no crackles or wheezes. Heart:  Normal rate and regular rhythm. S1 and S2 normal without  gallop, murmur, click, rub or other extra sounds. Abdomen:  Bowel sounds positive,abdomen soft and non-tender without masses, organomegaly or hernias noted. Msk:  No deformity or scoliosis noted of thoracic or lumbar spine.   Extremities:  No clubbing, cyanosis, edema, or deformity noted with normal full range of motion of all joints.   Neurologic:  alert & oriented X3, cranial nerves II-XII intact, and strength normal in all extremities.     Impression & Recommendations:  Problem # 1:  MEDIAL EPICONDYLITIS, LEFT (ICD-726.31) improved with thecortisone shot  Problem # 2:  Hx of MEDIAL MENISCUS TEAR (ICD-836.0) s/p repair  Problem # 3:  HYPERLIPIDEMIA (ICD-272.4) possible a contributor to the dizzy spells, but risks of CAD too high His updated medication list for this problem includes:    Crestor 10 Mg Tabs (Rosuvastatin calcium) .[name]... Take 1 tablet by mouth q hs  Labs Reviewed: Chol: 144 (04/28/2007)   HDL: 43 (04/28/2007)   LDL: 74 (04/28/2007)   TG: 137 (04/28/2007) SGOT: 24 (04/28/2007)   SGPT: 27 (04/28/2007)  Prior 10 Yr Risk Heart Disease: N/A (12/14/2006)  Problem # 4:  GERD (ICD-530.81)  His updated medication list for this problem includes:    Prilosec 40 Mg Cpdr (Omeprazole) ..... Once daily as needed  Problem # 5:  IRRITABLE BOWEL SYNDROME (ICD-564.1)  Problem # 6:  SINUSITIS, CHRONIC NOS (ICD-473.9) contnue with the salt water lavages His updated medication list for this problem includes:    Nasonex 50 Mcg/act Susp (Mometasone furoate) .[name]..[name] Two sprays q nostril daily    Astepro 0.15 % Soln (Azelastine hcl) .[name]..[name] Two sprays q nostril q hs  Complete Medication List: 1)  Crestor 10 Mg Tabs (Rosuvastatin calcium) .... Take 1 tablet by mouth q hs 2)  Benicar Hct 20-12.5 Mg Tabs (Olmesartan medoxomil-hctz) .[name].. 1 once daily 3)  Pulmicort Flexhaler 90 Mcg/act Inha (Budesonide (inhalation)) .... One puff p.o. bid 4)  Lunesta 2 Mg Tabs (Eszopiclone) .... One by mouth q  hs prn 5)  Clarinex 5 Mg Tabs (Desloratadine) .... One by mouth daily 6)  Bl Adult Aspirin Low Strength  81 Mg Chew (Aspirin) .... Once daily 7)  Vitamin B-12 100 Mcg Tabs (Cyanocobalamin) .... One p.o. daily over-the-counter 8)  Nasonex 50 Mcg/act Susp (Mometasone furoate) .... Two sprays q nostril daily 9)  Folic Acid 1 Mg Tabs (Folic acid) .... Once daily 10)  Mirapex 0.125 Mg Tabs (Pramipexole dihydrochloride) .... One at bedtime as needed 11)  Valium 5 Mg Tabs (Diazepam) .... At bedtime as needed 12)  Meloxicam 15 Mg Tabs (Meloxicam) .... One by mouth daily 13)  Prilosec 40 Mg Cpdr (Omeprazole) .... Once daily as needed 14)  Lac-hydrin Five 5 % Lotn (Ammonium lactate) .... Apply to affected area two times a day 15)  Astepro 0.15 % Soln (Azelastine hcl) .... Two sprays q nostril q hs  Hypertension Assessment/Plan:      The patient's hypertensive risk group is category C: Target organ damage and/or diabetes.  Today's blood pressure is 126/80.  His blood pressure goal is < 140/90.  Patient Instructions: 1)  Please schedule a follow-up appointment in 3 months. 2)  labs at [facility] 3)  BMP prior to visit, ICD-9: 4)  Hepatic Panel prior to visit, ICD-9: 5)  Lipid Panel prior to visit, ICD-9:      v 70.0 6)  TSH prior to visit, ICD-9: 7)  CBC w/ Diff prior to visit, ICD-9: 8)  Urine-dip prior to visit, ICD-9: 9)  PSA prior to visit, ICD-9:

## 2010-04-22 NOTE — Progress Notes (Signed)
Summary: abdominal pain  Phone Note Call from Patient   Caller: Patient Call For: Stacie Glaze MD Reason for Call: Acute Illness Summary of Call: Pt calls in complaining of epigastric pain (similar to Diverticulitis pain) x 4 days.  No N, V, D or fever, chills.  Mylanta does not help. Not much appetite. Only relief he gets is when he is supine.  Pain is worse standing. [facility]) [phone] Initial call taken by: Lynann Beaver CMA,  September 03, 2008 8:37 AM  Follow-up for Phone Call        per dr Lovell Sheehan, may have cipro 500 two times a day and flagyl 250 qid for 10 days Follow-up by: Willy Eddy, LPN,  September 03, 2008 9:41 AM    New/Updated Medications: CIPRO 500 MG TABS (CIPROFLOXACIN HCL) one by mouth two times a day x 10 days. FLAGYL 250 MG TABS (METRONIDAZOLE) one by mouth qid x 10 days.  No alcohol!   Prescriptions: CIPRO 500 MG TABS (CIPROFLOXACIN HCL) one by mouth two times a day x 10 days.  #20 x 0   Entered by:   Lynann Beaver CMA   Authorized by:   Stacie Glaze MD   Signed by:   Lynann Beaver CMA on 09/03/2008   Method used:   Electronically to        [facility] (retail)       [address]       [place], [place]  [address]       Ph: [phone]       Fax: [phone]   RxID:   [id number] FLAGYL 250 MG TABS (METRONIDAZOLE) one by mouth qid x 10 days.  No alcohol!  #40 x 0   Entered by:   Lynann Beaver CMA   Authorized by:   Stacie Glaze MD   Signed by:   Lynann Beaver CMA on 09/03/2008   Method used:   Electronically to        [facility] (retail)       [address]       [place], [place]  [address]       Ph: [phone]       Fax: [phone]   RxID:   [id number]  Pt notified.

## 2010-04-22 NOTE — Progress Notes (Signed)
Summary: refill  Medications Added LAC-HYDRIN FIVE 5 %  LOTN (AMMONIUM LACTATE) apply to affected area two times a day       Phone Note Call from Patient Call back at [facility] Phone [phone]   Caller: patient (live) Call For: jenkins Summary of Call: refill meloxicam 90 day rx  lac-hydrin 5 mg cream   Initial call taken by: Roselle Locus,  February 08, 2007 8:24 AM  Follow-up for Phone Call        scripts printed and out front for pick up. pt informed ..................................................................Marland KitchenWilly Eddy, LPN  February 08, 2007 8:40 AM     New/Updated Medications: LAC-HYDRIN FIVE 5 %  LOTN (AMMONIUM LACTATE) apply to affected area two times a day   Prescriptions: LAC-HYDRIN FIVE 5 %  LOTN (AMMONIUM LACTATE) apply to affected area two times a day  #60gm x 6   Entered by:   Willy Eddy, LPN   Authorized by:   Stacie Glaze MD   Signed by:   Willy Eddy, LPN on 84/16/6063   Method used:   Print then Give to Patient   RxID:   [id number] MELOXICAM 15 MG  TABS (MELOXICAM) one by mouth daily  #90 x 3   Entered and Authorized by:   Willy Eddy, LPN   Signed by:   Willy Eddy, LPN on 02/54/2706   Method used:   Print then Give to Patient   RxID:   [id number]

## 2010-04-22 NOTE — Progress Notes (Signed)
Summary: ultram rx   ---- Converted from flag ---- ---- 02/19/2010 9:19 AM, [place] Johnson wrote:  Hey [facility] is calling back about the RX you faxed in yesterday on this patient, the pharmacy number is [phone] , has questions about directions that were given, we requested the patient take the medication 2x daily and it is a 24 hr pill ------------------------------  Phone Note Outgoing Call   Call placed by: Harlow Mares CMA Duncan Dull),  February 19, 2010 9:26 AM Call placed to: Pharmacy Summary of Call: changed his ultram to once a day insurance will not cover it two times a day due to it being a 24 hour tablet. pt will take as needed  Initial call taken by: Harlow Mares CMA Duncan Dull),  February 19, 2010 9:29 AM    New/Updated Medications: ULTRAM ER 100 MG XR24H-TAB (TRAMADOL HCL) take one by mouth once daily

## 2010-04-22 NOTE — Progress Notes (Signed)
Summary: REQ FOR LAB ORDERS  Phone Note Call from Patient   Caller: Patient - In office Reason for Call: Acute Illness, Talk to Nurse, Talk to Doctor Summary of Call: Pt scheduled an appt for cpx for October 04, 2009 but adv that he would be having his labs drawn at [facility] (where he retired from and since gets them done for free)... Pt will need order for labs so he can have same drawn prior to appt with Dr Lovell Sheehan.... Pt adv that he has to go to [facility] next week and req that order for cpx labwork be left up front for him to p/u then..... Pt can be reached at [phone] with any questions or concerns.  Initial call taken by: Debbra Riding,  July 04, 2009 11:43 AM  Follow-up for Phone Call        upfront for pt Follow-up by: Willy Eddy, LPN,  July 04, 2009 11:52 AM

## 2010-04-22 NOTE — Assessment & Plan Note (Signed)
Summary: cellulitis/bmw   Vital Signs:  Patient Profile:   70 Years Old Male Temp:     99 degrees F Pulse rate:   76 / minute Resp:     14 per minute BP sitting:   136 / 80  (left arm)  Vitals Entered By: Willy Eddy, LPN (October 13, 2006 9:41 AM)               Chief Complaint:  abscess under left arm is still painfull/  continues on keflex 500 tid/one day remaining of meds.  History of Present Illness: patient is a 70 year old, white male, who presents for persistent hidradenitis of the left axilla.  He has worsening swelling and now the erythematous streaking down the left arm.  He denies any fever, pain, or any other symptoms.  He has been on Keflex 500 mg t.i.d. for one week. At the last office visit he was given a shot of Rocephin and initially was placed on ciprofloxacin 750 mg b.i.d..  His other medical problems are stable.  At this time.  Current Allergies: SULFA ERY-TAB (ERYTHROMYCIN BASE) BIAXIN (CLARITHROMYCIN) HYDROCODONE-ACETAMINOPHEN (HYDROCODONE-ACETAMINOPHEN)      Physical Exam  Skin:     examination of the left axilla reveals an area of erythema approximately 3 x 4 cm with soft tissue induration and a palpable movable mass.  The location of the induration is adjacent to a triceps tendon and has constrained.  The tendon causing the appearance of the mass extending down the left arm into the left axilla.  The mass has actually shrunk in size and is less tender and is still consistent with an infectious process.  He will continue the antibiotics until they are completed.  This Friday and will call our office if the infection is not resolving at which time he will be referred to a general surgeon for I&D of the infection. Axillary Nodes:     no additional lymphadenitis is detected    Impression & Recommendations:  Problem # 1:  HIDRADENITIS (ICD-705.83) the patient has hidradenitis, and infection of his left axilla that has lasted for approximately two  weeks.  He is on a second course of antibiotics now with Keflex.  If this infection has not resolved by the end of the week.  He would be referred to a general surgeon for I&D.  Problem # 2:  HYPERTENSION, ESSENTIAL NOS (ICD-401.9) Assessment: Unchanged  His updated medication list for this problem includes:    Benicar Hct 20-12.5 Mg Tabs (Olmesartan medoxomil-hctz) ..... One by mouth daily  BP today: 136/80   Medications Added to Medication List This Visit: 1)  Vitamin B-12 100 Mcg Tabs (Cyanocobalamin) .... One p.o. daily over-the-counter   Patient Instructions: 1)  the patient is instructed to continue his antibiotics until completion on Friday.  He will call the office with an update on to the size and the area of induration underneath his arm.  Should the hidradenitis  not be resolved.  We were for him to a Development worker, international aid.

## 2010-04-22 NOTE — Progress Notes (Signed)
Summary: raw scrotum  Phone Note Call from Patient Call back at [phone]   Call For: jenkins Reason for Call: Talk to Doctor Summary of Call: Raw under scrotum from legs rubbing when he takes his walk to point he can't walk, comes from the rubbing & sweating.  He does not think it's fungal, just raw.  Suggested scrotal support which he hasn't been using.  He asked if vaseline would help. Initial call taken by: Rudy Jew, RN,  Aug 02, 2007 1:52 PM  Follow-up for Phone Call        talked with pt and suggested a &d ointment and may want to use scrotal support when exercising if this is happening while exercising--pt already uses a fungal powder- call back in several days if a & d doesnt help Follow-up by: Willy Eddy, LPN,  Aug 02, 2007 5:18 PM

## 2010-04-22 NOTE — Progress Notes (Signed)
Summary: need pre auth asap   Phone Note Call from Patient Call back at Home Phone [phone]   Caller: PATIENT TRIAGE MESSAGE Call For: [name] Summary of Call: 90 DAY RX FOR MELOXICAM   [facility] HAS SENT FORM FOR PREAUTHORIZATION  HAS THIS FORM BEEN FILLED OUT AND APPROVED  ALSO NEEDS TO PICK UP RX FOR 90 DAYS FOR PULMOCART Initial call taken by: Roselle Locus,  February 28, 2007 8:44 AM  Follow-up for Phone Call        Form in [facility] box to be filled out Follow-up by: Florentina Addison,  March 02, 2007 2:07 PM      Prescriptions: Joshua [name] 90 MCG/ACT  INHA (BUDESONIDE (INHALATION)) one puff p.o. bid  #1 x 3   Entered by:   Lynann Beaver CMA   Authorized by:   Stacie Glaze MD   Signed by:   Lynann Beaver CMA on 03/02/2007   Method used:   Print then Give to Patient   RxID:   [id number]

## 2010-04-23 DIAGNOSIS — C679 Malignant neoplasm of bladder, unspecified: Secondary | ICD-10-CM | POA: Insufficient documentation

## 2010-04-23 DIAGNOSIS — C61 Malignant neoplasm of prostate: Secondary | ICD-10-CM

## 2010-04-23 HISTORY — DX: Malignant neoplasm of prostate: C61

## 2010-04-23 HISTORY — PX: ROBOT ASSISTED LAPAROSCOPIC RADICAL PROSTATECTOMY: SHX5141

## 2010-04-23 HISTORY — DX: Malignant neoplasm of bladder, unspecified: C67.9

## 2010-04-24 NOTE — Assessment & Plan Note (Signed)
Summary: surgical clearance   prostate cancer  Medications Added MIRALAX  POWD (POLYETHYLENE GLYCOL 3350) daily--HOLD      Allergies Added:   Visit Type:  Follow-up Primary Provider:  Darryll Capers, MD  CC:  CAD.  History of Present Illness: The patient presents for followup of his known coronary disease. Since I last saw him he has been found to have prostate cancer and is to have surgery. He may have either robotics lower cryotherapy.  He denies any ongoing cardiovascular symptoms. He has had no chest pressure, neck or arm discomfort. He is not having palpitations, presyncope or syncope. He is having no PND or orthopnea. He has been active exercising most days.  Current Medications (verified): 1)  Crestor 10 Mg Tabs (Rosuvastatin Calcium) .... Take 1 Tablet By Mouth Q Hs 2)  Benicar Hct 20-12.5 Mg Tabs (Olmesartan Medoxomil-Hctz) .[name].. 1 Once Daily 3)  Clarinex 5 Mg  Tabs (Desloratadine) .... One By Mouth Daily 4)  Bl Adult Aspirin Low Strength 81 Mg  Chew (Aspirin) .... Once Daily 5)  Vitamin B-12 100 Mcg  Tabs (Cyanocobalamin) .... One P.o. Daily Over-The-Counter 6)  Flonase 50 Mcg/act Susp (Fluticasone Propionate) .... 2 Spray Each Nostril Once Daily 7)  Folic Acid 1 Mg  Tabs (Folic Acid) .... Once Daily 8)  Mirapex 0.125 Mg  Tabs (Pramipexole Dihydrochloride) .... One At Bedtime As Needed 9)  Valium 5 Mg  Tabs (Diazepam) .... At Bedtime As Needed 10)  Prilosec 20 Mg Cpdr (Omeprazole) .... One By Mouth Two Times A Day With Samples 11)  Lac-Hydrin Five 5 % Lotn (Ammonium Lactate) .... Apply To Affected Area Two Times A Day As Needed 12)  Qvar 80 Mcg/act Aers (Beclomethasone Dipropionate) .... Two Puffs By Mouth Daily 13)  Proair Hfa 108 (90 Base) Mcg/act  Aers (Albuterol Sulfate) .... Wo Pudd By Mouth  Q 6 Hour Prn 14)  Benefiber  Powd (Wheat Dextrin) .... Mix Powder in Water. 15)  Nitrostat 0.4 Mg Subl (Nitroglycerin) .[name].. 1 Tablet Under Tongue At Onset of Chest Pain; You May  Repeat Every 5 Minutes For Up To 3 Doses. 16)  Miralax  Powd (Polyethylene Glycol 3350) .... Daily--Hold 17)  Align  Caps (Probiotic Product) .[name].. 1 By Mouth Daily 18)  Ultram Er 100 Mg Xr24h-Tab (Tramadol Hcl) .... Take One By Mouth Once Daily  Allergies (verified): 1)  ! Vicodin (Hydrocodone-Acetaminophen) 2)  ! Bromfenex 3)  ! Doxycycline Hyclate (Doxycycline Hyclate) 4)  ! Ketek (Telithromycin) 5)  ! Promethazine Hcl (Promethazine Hcl) 6)  ! Provigil (Modafinil) 7)  ! Codeine 8)  Sulfa 9)  Ery-Tab (Erythromycin Base) 10)  Biaxin (Clarithromycin) 11)  Hydrocodone-Acetaminophen (Hydrocodone-Acetaminophen)  Past History:  Past Medical History: Asthma......Marland KitchenDr Charna Archer > dxed 2002. On QVAR since then. 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 Thyroidectomy partial Coronary artery bypass graft Cholecystectomy Cervical fusion Cervical laminectomy 05/26/06 Athroscopy right knee 04/2008  daldorf  Review of Systems       As stated in the HPI and negative for all other systems.   Vital Signs:  Patient profile:   70 year old male Height:      67 inches Weight:  202 pounds BMI:     31.75 Pulse rate:   50 / minute Resp:     16 per minute BP sitting:   142 / 80  (right arm)  Vitals Entered By: Marrion Coy, CNA (April 03, 2010 2:29 PM)  Physical Exam  General:  Well developed, well nourished, in no acute distress. Head:  normocephalic and atraumatic Neck:  Neck supple, no JVD. No masses, thyromegaly or abnormal cervical nodes. Chest Wall:  no  deformities well healed sternotomy scar Lungs:  Clear bilaterally to auscultation and percussion. Heart:  Non-displaced PMI, chest non-tender; regular rate and rhythm, S1, S2 without murmurs, rubs or gallops. Carotid upstroke normal, no bruit. Normal abdominal aortic size, no bruits. Femorals normal pulses, no bruits. Pedals normal pulses. No edema, no varicosities. Abdomen:  Bowel sounds positive; abdomen soft and non-tender without masses, organomegaly, or hernias noted. No hepatosplenomegaly. Msk:  Back normal, normal gait. Muscle strength and tone normal. Extremities:  No clubbing or cyanosis. Neurologic:  Alert and oriented x 3. Skin:  Intact without lesions or rashes. Cervical Nodes:  no significant adenopathy Inguinal Nodes:  no significant adenopathy Psych:  Normal affect.   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 He had a negative stress perfusion study in February of last year. His year was 61%. No change in therapy is indicated. No further testing is necessary.  Problem # 2:  HYPERLIPIDEMIA (ICD-272.4) His last lipids in April demonstrated an HDL of 44 and LDL of 81. This is a reasonable ratio of HDL of 70 or lower would be optimal.  Problem # 3:  HYPERTENSION (ICD-401.9) He reports that his blood pressure is typically well below 140/90. We discussed his target and weight loss to keep at a optimal blood pressure.  Other Orders: EKG w/ Interpretation (93000)  Patient Instructions: 1)  Your physician recommends that you schedule a follow-up appointment in: 1 year with Dr Antoine Poche 2)  Your physician recommends that you continue on your current medications as directed. Please refer to the Current Medication list  given to you today.

## 2010-04-24 NOTE — Progress Notes (Signed)
Summary: PT REQUEST CALL   Phone Note Call from Patient Call back at [facility] Phone [phone]   Caller: Patient Summary of Call: PT REQUEST CALL Initial call taken by: Judie Grieve,  March 31, 2010 9:55 AM  Follow-up for Phone Call        pt needs surgical clearance for prostate surgery.  Appt scheduled for Thursday at 2:30pm Follow-up by: Charolotte Capuchin, RN,  March 31, 2010 10:26 AM

## 2010-04-24 NOTE — Consult Note (Signed)
Summary: [facility]  [facility]   Imported By: Maryln Gottron 04/07/2010 10:44:08  _____________________________________________________________________  External Attachment:    Type:   Image     Comment:   External Document

## 2010-04-24 NOTE — Consult Note (Signed)
Summary: [facility]  [facility]   Imported By: Maryln Gottron 03/04/2010 09:42:14  _____________________________________________________________________  External Attachment:    Type:   Image     Comment:   External Document

## 2010-04-24 NOTE — Assessment & Plan Note (Signed)
Summary: abd pain...as.    History of Present Illness Visit Type: Follow-up Visit Primary GI MD: Sheryn Bison MD FACP FAGA Primary Tytiana Coles: Darryll Capers, MD Requesting Journey [place]: n/a Chief Complaint: Upper abdominal pain with interittant sharp pains with intermittant loose stools X3 months.  History of Present Illness:   70 year old Caucasian male with multiple medical problems including coronary artery disease and previous bypass surgery. He is referred today for evaluation of 3 months of constant sharp epigastric pain worsened by being without associated anorexia, weight loss, reflux symptoms or dysphagia. In fact, he is gaining weight. He denies abuse of alcohol, cigarettes, or NSAIDs.  He's had multiple GI evaluations in the past including endoscopy and colonoscopy within the last year. He's had cholecystectomy because of a dysfunctional gallbladder. He gives a vague history of pancreatitis some 30 years ago. He has constipation predominant IBS is on daily MiraLax. Gives a history of B12 deficiency and is on B12 and folic acid replacement. He takes Prilosec 20 mg a day on a chronic basis. He denies melena, hematochezia, fever, chills, or systemic complaints. He does have known diverticulosis coli. He also takes daily probiotic therapy without improvement.   GI Review of Systems    Reports abdominal pain, bloating, nausea, and  weight gain.     Location of  Abdominal pain: upper abdomen.    Denies acid reflux, belching, chest pain, dysphagia with liquids, dysphagia with solids, heartburn, loss of appetite, vomiting, vomiting blood, and  weight loss.      Reports change in bowel habits and  diarrhea.     Denies anal fissure, black tarry stools, constipation, diverticulosis, fecal incontinence, heme positive stool, hemorrhoids, irritable bowel syndrome, jaundice, light color stool, liver problems, rectal bleeding, and  rectal pain.    Current Medications (verified): 1)  Crestor 10 Mg  Tabs (Rosuvastatin Calcium) .... Take 1 Tablet By Mouth Q Hs 2)  Benicar Hct 20-12.5 Mg Tabs (Olmesartan Medoxomil-Hctz) .[name].. 1 Once Daily 3)  Clarinex 5 Mg  Tabs (Desloratadine) .... One By Mouth Daily 4)  Bl Adult Aspirin Low Strength 81 Mg  Chew (Aspirin) .... Once Daily 5)  Vitamin B-12 100 Mcg  Tabs (Cyanocobalamin) .... One P.o. Daily Over-The-Counter 6)  Flonase 50 Mcg/act Susp (Fluticasone Propionate) .... 2 Spray Each Nostril Once Daily 7)  Folic Acid 1 Mg  Tabs (Folic Acid) .... Once Daily 8)  Mirapex 0.125 Mg  Tabs (Pramipexole Dihydrochloride) .... One At Bedtime As Needed 9)  Valium 5 Mg  Tabs (Diazepam) .... At Bedtime As Needed 10)  Prilosec 20 Mg Cpdr (Omeprazole) .... One By Mouth Two Times A Day With Samples 11)  Lac-Hydrin Five 5 % Lotn (Ammonium Lactate) .... Apply To Affected Area Two Times A Day As Needed 12)  Qvar 80 Mcg/act Aers (Beclomethasone Dipropionate) .... Two Puffs By Mouth Daily 13)  Proair Hfa 108 (90 Base) Mcg/act  Aers (Albuterol Sulfate) .... Wo Pudd By Mouth  Q 6 Hour Prn 14)  Benefiber  Powd (Wheat Dextrin) .... Mix Powder in Water. 15)  Nitrostat 0.4 Mg Subl (Nitroglycerin) .[name].. 1 Tablet Under Tongue At Onset of Chest Pain; You May Repeat Every 5 Minutes For Up To 3 Doses. 16)  Miralax  Powd (Polyethylene Glycol 3350) .... Daily 17)  Align  Caps (Probiotic Product) .[name].. 1 By Mouth Daily  Allergies (verified): 1)  ! Vicodin (Hydrocodone-Acetaminophen) 2)  ! Bromfenex 3)  ! Doxycycline Hyclate (Doxycycline Hyclate) 4)  ! Ketek (Telithromycin) 5)  ! Promethazine Hcl (Promethazine  Hcl) 6)  ! Provigil (Modafinil) 7)  ! Codeine 8)  Sulfa 9)  Ery-Tab (Erythromycin Base) 10)  Biaxin (Clarithromycin) 11)  Hydrocodone-Acetaminophen (Hydrocodone-Acetaminophen)  Past History:  Past medical, surgical, family and social histories (including risk factors) reviewed for relevance to current acute and chronic problems.  Past Medical History: Reviewed history  from 06/10/2009 and no changes required. Asthma......Marland KitchenDr Charna Archer > dxed 2002. On QVAR since then. 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 Thyroidectomy partial Coronary artery bypass graft Cholecystectomy Cervical fusion Cervical laminectomy 05/26/06 Athroscopy right knee 04/2008  daldorf  Family History: Reviewed history from 02/28/2009 and no changes required. father Family History of CAD Male 1st degree relative <50 mother passed a 33 No FH of Colon Cancer:  Social History: Reviewed history from 05/01/2009 and no changes required. Married Former Smoker, quit in 1994 x 35 years avg. >1 ppd Retired Alcohol Use - yes 2 per week Daily Caffeine Use Illicit Drug Use - no Exercise regular  Review of Systems       The patient complains of allergy/sinus, anemia, arthritis/joint pain, back pain, fatigue, fever, muscle pains/cramps, and thirst - excessive.  The patient denies anxiety-new, blood in urine, breast changes/lumps, change in vision, confusion, cough, coughing up blood, depression-new, fainting, headaches-new, hearing problems, heart murmur, heart rhythm changes, itching, menstrual pain, night sweats, nosebleeds, pregnancy symptoms, shortness of breath, skin rash, sleeping problems, sore throat, swelling of feet/legs, swollen lymph glands, thirst - excessive , urination - excessive , urination changes/pain, urine leakage, vision changes, and voice change.   CV:  Denies chest pains, angina, palpitations, syncope, dyspnea on exertion, orthopnea, PND, peripheral edema, and  claudication. Resp:  Denies dyspnea at rest, dyspnea with exercise, cough, sputum, wheezing, coughing up blood, and pleurisy. Neuro:  Denies weakness, paralysis, abnormal sensation, seizures, syncope, tremors, vertigo, transient blindness, frequent falls, frequent headaches, difficulty walking, headache, sciatica, radiculopathy other:, restless legs, memory loss, and confusion. Psych:  Denies depression, anxiety, memory loss, suicidal ideation, hallucinations, paranoia, phobia, and confusion. Heme:  Denies bruising, bleeding, enlarged lymph nodes, and pagophagia.  Vital Signs:  Patient profile:   70 year old male Height:      67 inches Weight:      200.25 pounds Temp:     97.5 degrees F oral Pulse rate:   72 / minute Pulse rhythm:   regular BP sitting:   112 / 64  (left arm) Cuff size:   regular  Vitals Entered By: Christie Nottingham CMA Duncan Dull) (February 18, 2010 3:13 PM)  Physical Exam  General:  Well developed, well nourished, no acute distress.healthy appearing.   Head:  Normocephalic and atraumatic. Eyes:  PERRLA, no icterus.exam deferred to patient's ophthalmologist.   Neck:  Supple; no masses or thyromegaly. Lungs:  Clear throughout to auscultation. Heart:  Regular rate and rhythm; no murmurs, rubs,  or bruits. Abdomen:  Soft, nontender and nondistended. No masses, hepatosplenomegaly or hernias noted. Normal bowel sounds. Extremities:  No clubbing, cyanosis, edema or deformities noted. Neurologic:  Alert and  oriented x4;  grossly normal neurologically. Cervical Nodes:  No significant cervical adenopathy. Psych:  Alert and cooperative. Normal mood and affect.   Impression & Recommendations:  Problem #  1:  ABDOMINAL PAIN -GENERALIZED (ICD-789.07) Assessment Deteriorated His abdominal pain is now localized to the epigastric area. It does not radiate into his back and is not associated with malabsorption syndrome or systemic complaints. This patient has a long history of  functional GI issues including cholecystectomy for" dysfunctional gallbladder". Previous exams for H. pylori peptic ulcer disease in the negative. He is maintained on chronic PPI therapy and probiotics. I have ordered CT scan of the abdomen and pelvis and we'll repeat screening labs. I have placed him on tramadol XL 100 mg b.i.d. as needed for his pain pending further evaluation. If symptoms do not seem consistent with ischemic bowel disease. He is stable from a cardiopulmonary inspiratory and assessment. Orders: TLB-CBC Platelet - w/Differential (85025-CBCD) TLB-BMP (Basic Metabolic Panel-BMET) (80048-METABOL) TLB-Hepatic/Liver Function Pnl (80076-HEPATIC) TLB-TSH (Thyroid Stimulating Hormone) (84443-TSH) TLB-B12, Serum-Total ONLY (16109-U04) TLB-Ferritin (82728-FER) TLB-Folic Acid (Folate) (82746-FOL) TLB-IBC Pnl (Iron/FE;Transferrin) (83550-IBC) TLB-Amylase (82150-AMYL) TLB-Lipase (83690-LIPASE) TLB-IgA (Immunoglobulin A) (82784-IGA) T-Sprue Panel (Celiac Disease Aby Eval) (83516x3/[address]) CT Abdomen/Pelvis with Contrast (CT Abd/Pelvis w/con)  Problem # 2:  CONSTIPATION (ICD-564.00) Assessment: Improved Continue MiraLax, probiotics, and fiber supplements as needed.  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  Problem # 4:  CORONARY ARTERY DISEASE (ICD-414.00) Assessment: Improved Continue other medications per Dr. Lovell Sheehan and Dr. Antoine Poche.  Patient Instructions: 1)  Copy sent to : Darryll Capers, MD 2)  Please go to the basement today for your labs.  3)  Your CT scan is scheduled for 02/19/2010, please follow seperate instructions. 4)  Your prescription(s) have been sent to you pharmacy.  5)  The medication list was reviewed and reconciled.  All changed / newly prescribed medications were explained.  A complete medication list was provided to the patient / caregiver. Prescriptions: ULTRAM ER 100 MG  XR24H-TAB (TRAMADOL HCL) take one by mouth two times a day  #60 x 0   Entered by:   Harlow Mares CMA (AAMA)   Authorized by:   Mardella Layman MD [facility]   Signed by:   Harlow Mares CMA (AAMA) on 02/18/2010   Method used:   Faxed to ...       [facility] (retail)       [address]       [place], [place]  [address]       Ph: [phone]       Fax: [phone]   RxID:   [id number]

## 2010-04-24 NOTE — Progress Notes (Signed)
Summary: Questions about appt. at hosp.   Phone Note Call from Patient Call back at [facility] Phone [phone]   Caller: Patient Call For: Dr. Jarold Motto Reason for Call: Talk to Nurse Summary of Call: Wants to know if he should Fast before his appt. on 03-13-10 Initial call taken by: Karna Christmas,  March 10, 2010 2:33 PM  Follow-up for Phone Call        Left a message telling pt to be npo atleast 6 hours before study. Follow-up by: Christie Nottingham CMA Duncan Dull),  March 11, 2010 8:23 AM

## 2010-04-24 NOTE — Letter (Signed)
Summary: [facility]  [facility]   Imported By: Lennie Odor 03/11/2010 12:13:10  _____________________________________________________________________  External Attachment:    Type:   Image     Comment:   External Document

## 2010-04-24 NOTE — Assessment & Plan Note (Signed)
Summary: 3 month follow up/cjr   Vital Signs:  Patient profile:   70 year old male Height:      67 inches Weight:      202 pounds BMI:     31.75 Temp:     98.2 degrees F oral Pulse rate:   68 / minute Resp:     14 per minute BP sitting:   130 / 80  (left arm)  Vitals Entered By: Willy Eddy, LPN (April 11, 2010 8:49 AM) CC: roa, Hypertension Management Is Patient Diabetic? No   Primary Care Provider:  Darryll Capers, MD  CC:  roa and Hypertension Management.  History of Present Illness: The pt discussed the case with Vonita Moss for the cryotherapy and he seemed to promote the robotic surgery. as not made up mind about the surgery and has an appointment with Dr Wilson Singer. Has been cleared by Dr [place]   Hypertension History:      He denies headache, chest pain, palpitations, dyspnea with exertion, orthopnea, PND, peripheral edema, visual symptoms, neurologic problems, syncope, and side effects from treatment.        Positive major cardiovascular risk factors include male age 46 years old or older, hyperlipidemia, and hypertension.  Negative major cardiovascular risk factors include negative family history for ischemic heart disease and non-tobacco-user status.        Positive history for target organ damage include ASHD (either angina/prior MI/prior CABG).     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) 5)  Back Pain  (ICD-724.5) 6)  Gastroenteritis  (ICD-558.9) 7)  Fatigue  (ICD-780.79) 8)  Asthma  (ICD-493.90) 9)  Thrush  (ICD-112.0) 10)  Shortness of Breath (SOB)  (ICD-786.05) 11)  Chest Pain-unspecified  (ICD-786.50) 12)  Asthenia   (ICD-780.79) 13)  Asthma Unspecified With Exacerbation  (ICD-493.92) 14)  Pulmonary Congestion  (ICD-786.9) 15)  Adverse Drug Reaction  (ICD-995.20) 16)  Constipation  (ICD-564.00) 17)  Abdominal Pain -generalized  (ICD-789.07) 18)  Loc Osteoarthros Not Spec Prim/sec Pelv Rgn&thi  (ICD-715.35) 19)  Hip Pain, Right  (ICD-719.45) 20)  Tobacco Abuse, Hx of  (ICD-V15.82) 21)  Anemia, Iron Deficiency  (ICD-280.9) 22)  Heel Pain, Right  (ICD-729.5) 23)  Abdominal Pain, Epigastric  (ICD-789.06) 24)  Medial Epicondylitis, 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) 25)  Preventive Health Care  (ICD-V70.0) 26)  Family History of Cad Male 1st Degree Relative <50  (ICD-V17.3) 27)  Tinnitus, Chronic, 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) 28)  Hypertension  (ICD-401.9) 29)  Symptom, Memory Loss  (ICD-780.93) 30)  Sinusitis, Chronic Nos  (ICD-473.9) 31)  Osteoarthrosis Nos, Unspecified Site  (ICD-715.90) 32)  Symptom, Hypersomnia W/sleep Apnea Nos  (ICD-780.53) 33)  Hx of Medial Meniscus Tear  (ICD-836.0) 34)  Hyperlipidemia  (ICD-272.4) 35)  Gerd  (ICD-530.81) 36)  Depression  (ICD-311) 37)  Coronary Artery Disease  (ICD-414.00) 38)  Asthma  (ICD-493.90) 39)  Irritable Bowel Syndrome  (ICD-564.1)  Current Problems (verified): 1)  Adenocarcinoma, Prostate, 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) 2)  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) 3)  Acute Frontal Sinusitis  (ICD-461.1) 4)  Osteoarthritis, Lower Leg, 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) 5)  Back Pain  (ICD-724.5) 6)  Gastroenteritis  (ICD-558.9) 7)  Fatigue  (ICD-780.79) 8)  Asthma  (ICD-493.90) 9)  Thrush  (ICD-112.0) 10)  Shortness of Breath (SOB)  (ICD-786.05) 11)  Chest Pain-unspecified  (ICD-786.50) 12)  Asthenia  (ICD-780.79) 13)  Asthma Unspecified With Exacerbation  (ICD-493.92) 14)  Pulmonary Congestion  (ICD-786.9) 15)  Adverse Drug Reaction  (ICD-995.20) 16)  Constipation  (ICD-564.00) 17)  Abdominal Pain -generalized  (ICD-789.07) 18)  Loc Osteoarthros Not Spec  Prim/sec Pelv Rgn&thi  (ICD-715.35) 19)  Hip Pain, Right  (ICD-719.45) 20)  Tobacco Abuse, Hx of  (ICD-V15.82) 21)  Anemia, Iron Deficiency  (ICD-280.9) 22)  Heel Pain, Right  (ICD-729.5) 23)  Abdominal Pain, Epigastric  (ICD-789.06) 24)  Medial Epicondylitis, 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) 25)  Preventive Health Care  (ICD-V70.0) 26)  Family History of Cad Male 1st Degree Relative <50  (ICD-V17.3) 27)  Tinnitus, Chronic, 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 Take 1 Tablet By Mouth Q Hs 2)  Benicar Hct 20-12.5 Mg Tabs (Olmesartan Medoxomil-Hctz) .[name].. 1 Once Daily 3)  Clarinex 5 Mg  Tabs (Desloratadine) .... One By Mouth Daily 4)  Bl Adult Aspirin Low Strength 81 Mg  Chew (Aspirin) .... Once Daily 5)  Vitamin B-12 100 Mcg  Tabs (Cyanocobalamin) .... One P.o. Daily Over-The-Counter 6)  Flonase 50 Mcg/act Susp (Fluticasone Propionate) .... 2 Spray Each Nostril Once Daily 7)  Folic Acid 1 Mg  Tabs (Folic Acid) .... Once Daily 8)  Mirapex 0.125 Mg  Tabs (Pramipexole Dihydrochloride) .... One At Bedtime As Needed 9)  Valium 5 Mg  Tabs (Diazepam) .... At Bedtime As Needed 10)  Prilosec 20 Mg Cpdr (Omeprazole) .... One By Mouth Two Times A Day With Samples 11)  Lac-Hydrin Five 5 % Lotn (Ammonium Lactate) .... Apply To Affected Area Two Times A Day As Needed 12)  Qvar 80 Mcg/act Aers (Beclomethasone Dipropionate) .... Two Puffs By Mouth Daily 13)  Proair Hfa 108 (90 Base)  Mcg/act  Aers (Albuterol Sulfate) .... Wo Pudd By Mouth  Q 6 Hour Prn 14)  Benefiber  Powd (Wheat Dextrin) .... Mix Powder in Water. 15)  Nitrostat 0.4 Mg Subl (Nitroglycerin) .[name].. 1 Tablet Under Tongue At Onset of Chest Pain; You May Repeat Every 5 Minutes For Up To 3 Doses. 16)  Miralax  Powd (Polyethylene Glycol 3350) .... Daily--Hold 17)  Align  Caps (Probiotic Product) .[name].. 1 By Mouth Daily 18)  Ultram Er 100 Mg Xr24h-Tab (Tramadol Hcl) .... Take One By Mouth Once Daily  Current Medications (verified): 1)  Crestor 10 Mg Tabs (Rosuvastatin Calcium) .... Take 1 Tablet By Mouth Q Hs 2)  Benicar Hct 20-12.5 Mg Tabs (Olmesartan Medoxomil-Hctz) .[name].. 1 Once Daily 3)  Clarinex 5 Mg  Tabs (Desloratadine) .... One By Mouth Daily 4)  Bl Adult Aspirin Low Strength 81 Mg  Chew (Aspirin) .... Once Daily 5)  Vitamin B-12 100 Mcg  Tabs (Cyanocobalamin) .... One P.o. Daily Over-The-Counter 6)  Flonase 50 Mcg/act Susp (Fluticasone Propionate) .... 2  Spray Each Nostril Once Daily 7)  Folic Acid 1 Mg  Tabs (Folic Acid) .... Once Daily 8)  Mirapex 0.125 Mg  Tabs (Pramipexole Dihydrochloride) .... One At Bedtime As Needed 9)  Valium 5 Mg  Tabs (Diazepam) .... At Bedtime As Needed 10)  Prilosec 20 Mg Cpdr (Omeprazole) .... One By Mouth Two Times A Day With Samples 11)  Lac-Hydrin Five 5 % Lotn (Ammonium Lactate) .... Apply To Affected Area Two Times A Day As Needed 12)  Qvar 80 Mcg/act Aers (Beclomethasone Dipropionate) .... Two Puffs By Mouth Daily 13)  Proair Hfa 108 (90 Base) Mcg/act  Aers (Albuterol Sulfate) .... Wo Pudd By Mouth  Q 6 Hour Prn 14)  Benefiber  Powd (Wheat Dextrin) .... Mix Powder in Water. 15)  Nitrostat 0.4 Mg Subl (Nitroglycerin) .[name].. 1 Tablet Under Tongue At Onset of Chest Pain; You May Repeat Every 5 Minutes For Up To 3 Doses. 16)  Miralax  Powd (Polyethylene Glycol 3350) .... Daily--Hold 17)  Align  Caps (Probiotic Product) .[name].. 1 By Mouth Daily 18)  Ultram Er 100 Mg Xr24h-Tab  (Tramadol Hcl) .... Take One By Mouth Once Daily  Allergies (verified): 1)  ! Vicodin (Hydrocodone-Acetaminophen) 2)  ! Bromfenex 3)  ! Doxycycline Hyclate (Doxycycline Hyclate) 4)  ! Ketek (Telithromycin) 5)  ! Promethazine Hcl (Promethazine Hcl) 6)  ! Provigil (Modafinil) 7)  ! Codeine 8)  Sulfa 9)  Ery-Tab (Erythromycin Base) 10)  Biaxin (Clarithromycin) 11)  Hydrocodone-Acetaminophen (Hydrocodone-Acetaminophen)  Past History:  Family History: Last updated: 02/28/2009 father Family History of CAD Male 1st degree relative <50 mother passed a 78 No FH of Colon Cancer:  Social History: Last updated: 05/01/2009 Married Former Smoker, quit in 1994 x 35 years avg. >1 ppd Retired Alcohol Use - yes 2 per week Daily Caffeine Use Illicit Drug Use - no Exercise regular  Risk Factors: Caffeine Use: 5+ (02/21/2007) Exercise: no (02/21/2007)  Risk Factors: Smoking Status: never (04/11/2010) Passive Smoke Exposure: no (04/11/2010)  Past medical, surgical, family and social histories (including risk factors) reviewed, and no changes noted (except as noted below).  Past Medical History: Reviewed history from 04/03/2010 and no changes required. Asthma......Marland KitchenDr Charna Archer > dxed 2002. On QVAR since then. 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 Thyroidectomy partial Coronary artery bypass graft Cholecystectomy Cervical fusion Cervical laminectomy 05/26/06 Athroscopy right knee 04/2008  daldorf  Family History: Reviewed history from 02/28/2009 and no  changes required. father Family History of CAD Male 1st degree relative <50 mother passed a 18 No FH of Colon Cancer:  Social History: Reviewed history from 05/01/2009 and no changes required. Married Former Smoker, quit in 1994 x 35 years avg. >1 ppd Retired Alcohol Use - yes 2 per week Daily Caffeine Use Illicit Drug Use - no Exercise regular  Review of Systems  The patient denies anorexia, fever, weight loss, weight gain, vision loss, decreased hearing, hoarseness, chest pain, syncope, dyspnea on exertion, peripheral edema, prolonged cough, headaches, hemoptysis, abdominal pain, melena, hematochezia, severe indigestion/heartburn, hematuria, incontinence, genital sores, muscle weakness, suspicious skin lesions, transient blindness, difficulty walking, depression, unusual weight change, abnormal bleeding, enlarged lymph nodes, angioedema, breast masses, and testicular masses.  Physical Exam  General:  Well-developed,well-nourished,in no acute distress; alert,appropriate and cooperative throughout examination Head:  normocephalic, atraumatic, and male-pattern balding.   Eyes:  anicteric Ears:  hearing  aids removed.  Tympanic membranes and canals clear Nose:  mucosal erythema and mucosal edema.   Mouth:  Oral mucosa and oropharynx without lesions or exudates.  Teeth in good repair. Neck:  No deformities, masses, or tenderness noted. Lungs:  Normal respiratory effort, chest expands symmetrically. Lungs are clear to auscultation, no crackles or wheezes. Heart:  normal rate and regular rhythm.   Abdomen:  soft and non-tender.   Rectal:  external hemorrhoid(s).   Genitalia:  uncircumcised.   Prostate:  no nodules and no asymmetry.     Impression & Recommendations:  Problem # 1:  ADENOCARCINOMA, PROSTATE, GLEASON GRADE 6 (ICD-185) bone scan was negative  Problem # 2:  ASTHMA (ICD-493.90) Assessment: Deteriorated cardiopulmnary conditioning His updated medication list for  this problem includes:    Qvar 80 Mcg/act Aers (Beclomethasone dipropionate) .[name]..[name] Two puffs by mouth daily    Proair Hfa 108 (90 Base) Mcg/act Aers (Albuterol sulfate) .[name]... Wo pudd by mouth  q 6 hour prn  Pulmonary Functions Reviewed: O2 sat: 97 (06/06/2009)  Current Asthma Management Plan:  Green Zone:  QVAR 80 MCG/ACT AERS Yellow Zone:  PROAIR HFA 108 (90 BASE) MCG/ACT  AERS:  2 puffs every 4 hours as needed  Problem # 3:  HYPERTENSION (ICD-401.9) Assessment: Unchanged  His updated medication list for this problem includes:    Benicar Hct 20-12.5 Mg Tabs (Olmesartan medoxomil-hctz) .[name]... 1 once daily  BP today: 130/80 Prior BP: 142/80 (04/03/2010)  Prior 10 Yr Risk Heart Disease: N/A (12/14/2006)  Labs Reviewed: K+: 3.8 (02/18/2010) Creat: : 1.5 (02/18/2010)   Chol: 152 (07/04/2009)   HDL: 44.20 (07/04/2009)   LDL: 56 (10/12/2008)   TG: 113.0 (10/12/2008)  Complete Medication List: 1)  Crestor 10 Mg Tabs (Rosuvastatin calcium) .... Take 1 tablet by mouth q hs 2)  Benicar Hct 20-12.5 Mg Tabs (Olmesartan medoxomil-hctz) .[name].. 1 once daily 3)  Clarinex 5 Mg Tabs (Desloratadine) .... One by mouth daily 4)  Bl Adult Aspirin Low Strength 81 Mg Chew (Aspirin) .... Once daily 5)  Vitamin B-12 100 Mcg Tabs (Cyanocobalamin) .... One p.o. daily over-the-counter 6)  Flonase 50 Mcg/act Susp (Fluticasone propionate) .... 2 spray each nostril once daily 7)  Folic Acid 1 Mg Tabs (Folic acid) .... Once daily 8)  Mirapex 0.125 Mg Tabs (Pramipexole dihydrochloride) .... One at bedtime as needed 9)  Valium 5 Mg Tabs (Diazepam) .... At bedtime as needed 10)  Prilosec 20 Mg Cpdr (Omeprazole) .... One by mouth two times a day with samples 11)  Lac-hydrin Five 5 % Lotn (Ammonium lactate) .... Apply to affected area two times a day as needed 12)  Qvar 80 Mcg/act Aers (Beclomethasone dipropionate) .... Two puffs by mouth daily 13)  Proair Hfa 108 (90 Base) Mcg/act Aers (Albuterol sulfate) ....  Wo pudd by mouth  q 6 hour prn 14)  Benefiber Powd (Wheat dextrin) .... Mix powder in water. 15)  Nitrostat 0.4 Mg Subl (Nitroglycerin) .[name].. 1 tablet under tongue at onset of chest pain; you may repeat every 5 minutes for up to 3 doses. 16)  Miralax Powd (Polyethylene glycol 3350) .... Daily--hold 17)  Align Caps (Probiotic product) .[name].. 1 by mouth daily 18)  Ultram Er 100 Mg Xr24h-tab (Tramadol hcl) .... Take one by mouth once daily  Hypertension Assessment/Plan:  The patient's hypertensive risk group is category C: Target organ damage and/or diabetes.  Today's blood pressure is 130/80.  His blood pressure goal is < 140/90.  Patient Instructions: 1)  Please schedule a follow-up appointment in 2 months.   Orders Added: 1)  Est. Patient Level IV [16109]

## 2010-04-24 NOTE — Assessment & Plan Note (Signed)
Summary: discuss other treatment options/pt coming in at 12:30 per Dr ...   Vital Signs:  Patient profile:   70 year old male Height:      67 inches Weight:      200.25 pounds BMI:     31.48 Temp:     98.2 degrees F oral Pulse rate:   88 / minute Resp:     14 per minute BP sitting:   140 / 80  (left arm)  Vitals Entered By: Willy Eddy, LPN (April 01, 2010 12:42 PM) CC: cx prostate ca by dr Vonita Moss- wants to discuss options Is Patient Diabetic? No   Primary Care Provider:  Darryll Capers, MD  CC:  cx prostate ca by dr Vonita Moss- wants to discuss options.  History of Present Illness: diagnosed with prostate cancer, gleason stage 6-7 and has bone scan scheduled for next week for staging Has an appointment with cardiology for clearance Physiologic age vs chronological age discussion of therapy I have spent greater that 30 min face to face evaluating this patient over 1/2 in counciling We discussed controlled risk factors and the need to consider definative therapy rather that paliative  Preventive Screening-Counseling & Management  Alcohol-Tobacco     Smoking Status: never     Year Quit: 1993     Passive Smoke Exposure: no  Problems Prior to Update: 1)  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) 2)  Acute Frontal Sinusitis  (ICD-461.1) 3)  Osteoarthritis, Lower Leg, 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) 4)  Back Pain  (ICD-724.5) 5)  Gastroenteritis  (ICD-558.9) 6)  Fatigue  (ICD-780.79) 7)  Asthma  (ICD-493.90) 8)  Thrush  (ICD-112.0) 9)  Shortness of Breath (SOB)  (ICD-786.05) 10)  Chest Pain-unspecified  (ICD-786.50) 11)  Asthenia  (ICD-780.79) 12)  Asthma Unspecified With Exacerbation  (ICD-493.92) 13)  Pulmonary Congestion  (ICD-786.9) 14)  Adverse Drug Reaction  (ICD-995.20) 15)  Constipation  (ICD-564.00) 16)  Abdominal Pain -generalized  (ICD-789.07) 17)  Loc Osteoarthros Not Spec Prim/sec Pelv Rgn&thi  (ICD-715.35) 18)  Hip Pain, Right   (ICD-719.45) 19)  Tobacco Abuse, Hx of  (ICD-V15.82) 20)  Anemia, Iron Deficiency  (ICD-280.9) 21)  Heel Pain, Right  (ICD-729.5) 22)  Abdominal Pain, Epigastric  (ICD-789.06) 23)  Medial Epicondylitis, 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) 24)  Preventive Health Care  (ICD-V70.0) 25)  Family History of Cad Male 1st Degree Relative <50  (ICD-V17.3) 26)  Tinnitus, Chronic, 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1) 3)  Osteoarthritis, Lower Leg, 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) 4)  Back Pain  (ICD-724.5) 5)  Gastroenteritis  (ICD-558.9) 6)  Fatigue  (ICD-780.79) 7)  Asthma  (ICD-493.90) 8)  Thrush  (ICD-112.0) 9)  Shortness of Breath (SOB)  (ICD-786.05) 10)  Chest Pain-unspecified  (ICD-786.50) 11)  Asthenia  (ICD-780.79) 12)  Asthma Unspecified With Exacerbation  (ICD-493.92) 13)  Pulmonary Congestion  (ICD-786.9) 14)  Adverse Drug Reaction  (ICD-995.20) 15)  Constipation  (ICD-564.00) 16)  Abdominal Pain -  generalized  (ICD-789.07) 17)  Loc Osteoarthros Not Spec Prim/sec Pelv Rgn&thi  (ICD-715.35) 18)  Hip Pain, Right  (ICD-719.45) 19)  Tobacco Abuse, Hx of  (ICD-V15.82) 20)  Anemia, Iron Deficiency  (ICD-280.9) 21)  Heel Pain, Right  (ICD-729.5) 22)  Abdominal Pain, Epigastric  (ICD-789.06) 23)  Medial Epicondylitis, 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) 24)  Preventive Health Care  (ICD-V70.0) 25)  Family History of Cad Male 1st Degree Relative <50   (ICD-V17.3) 26)  Tinnitus, Chronic, 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 Take 1 Tablet By Mouth Q Hs 2)  Benicar Hct 20-12.5 Mg Tabs (Olmesartan Medoxomil-Hctz) .[name].. 1 Once Daily 3)  Clarinex 5 Mg  Tabs (Desloratadine) .... One By Mouth Daily 4)  Bl Adult Aspirin Low Strength 81 Mg  Chew (Aspirin) .... Once Daily 5)  Vitamin B-12 100 Mcg  Tabs (Cyanocobalamin) .... One P.o. Daily Over-The-Counter 6)  Flonase 50 Mcg/act Susp (Fluticasone Propionate) .... 2 Spray Each Nostril Once Daily 7)  Folic Acid 1 Mg  Tabs (Folic Acid) .... Once Daily 8)  Mirapex 0.125 Mg  Tabs (Pramipexole Dihydrochloride) .... One At Bedtime As Needed 9)  Valium 5 Mg  Tabs (Diazepam) .... At Bedtime As Needed 10)  Prilosec 20 Mg Cpdr (Omeprazole) .... One By Mouth Two Times A Day With Samples 11)  Lac-Hydrin Five 5 % Lotn (Ammonium Lactate) .... Apply To Affected Area Two Times A Day As Needed 12)  Qvar 80 Mcg/act Aers (Beclomethasone Dipropionate) .... Two Puffs By Mouth Daily 13)  Proair Hfa 108 (90 Base) Mcg/act  Aers (Albuterol Sulfate) .... Wo Pudd By Mouth  Q 6 Hour Prn 14)  Benefiber  Powd (Wheat Dextrin) .... Mix Powder in Water. 15)  Nitrostat 0.4 Mg Subl (Nitroglycerin) .[name].. 1 Tablet Under Tongue At Onset of Chest Pain; You May Repeat Every 5 Minutes For Up To 3 Doses. 16)  Miralax  Powd (Polyethylene Glycol 3350) .... Daily 17)  Align  Caps (Probiotic Product) .[name].. 1 By Mouth  Daily 18)  Ultram Er 100 Mg Xr24h-Tab (Tramadol Hcl) .... Take One By Mouth Once Daily  Current Medications (verified): 1)  Crestor 10 Mg Tabs (Rosuvastatin Calcium) .... Take 1 Tablet By Mouth Q Hs 2)  Benicar Hct 20-12.5 Mg Tabs (Olmesartan Medoxomil-Hctz) .[name].. 1 Once Daily 3)  Clarinex 5 Mg  Tabs (Desloratadine) .... One By Mouth Daily 4)  Bl Adult Aspirin Low Strength 81 Mg  Chew (Aspirin) .... Once Daily 5)  Vitamin B-12 100 Mcg  Tabs (Cyanocobalamin) .... One P.o. Daily Over-The-Counter 6)  Flonase 50 Mcg/act Susp (Fluticasone Propionate) .... 2 Spray Each Nostril Once Daily 7)  Folic Acid 1 Mg  Tabs (Folic Acid) .... Once Daily 8)  Mirapex 0.125 Mg  Tabs (Pramipexole Dihydrochloride) .... One At Bedtime As Needed 9)  Valium 5 Mg  Tabs (Diazepam) .... At Bedtime As Needed 10)  Prilosec 20 Mg Cpdr (Omeprazole) .... One By Mouth Two Times A Day With Samples 11)  Lac-Hydrin Five  5 % Lotn (Ammonium Lactate) .... Apply To Affected Area Two Times A Day As Needed 12)  Qvar 80 Mcg/act Aers (Beclomethasone Dipropionate) .... Two Puffs By Mouth Daily 13)  Proair Hfa 108 (90 Base) Mcg/act  Aers (Albuterol Sulfate) .... Wo Pudd By Mouth  Q 6 Hour Prn 14)  Benefiber  Powd (Wheat Dextrin) .... Mix Powder in Water. 15)  Nitrostat 0.4 Mg Subl (Nitroglycerin) .[name].. 1 Tablet Under Tongue At Onset of Chest Pain; You May Repeat Every 5 Minutes For Up To 3 Doses. 16)  Miralax  Powd (Polyethylene Glycol 3350) .... Daily 17)  Align  Caps (Probiotic Product) .[name].. 1 By Mouth Daily 18)  Ultram Er 100 Mg Xr24h-Tab (Tramadol Hcl) .... Take One By Mouth Once Daily  Allergies (verified): 1)  ! Vicodin (Hydrocodone-Acetaminophen) 2)  ! Bromfenex 3)  ! Doxycycline Hyclate (Doxycycline Hyclate) 4)  ! Ketek (Telithromycin) 5)  ! Promethazine Hcl (Promethazine Hcl) 6)  ! Provigil (Modafinil) 7)  ! Codeine 8)  Sulfa 9)  Ery-Tab (Erythromycin Base) 10)  Biaxin (Clarithromycin) 11)  Hydrocodone-Acetaminophen  (Hydrocodone-Acetaminophen)  Past History:  Family History: Last updated: 02/28/2009 father Family History of CAD Male 1st degree relative <50 mother passed a 29 No FH of Colon Cancer:  Social History: Last updated: 05/01/2009 Married Former Smoker, quit in 1994 x 35 years avg. >1 ppd Retired Alcohol Use - yes 2 per week Daily Caffeine Use Illicit Drug Use - no Exercise regular  Risk Factors: Caffeine Use: 5+ (02/21/2007) Exercise: no (02/21/2007)  Risk Factors: Smoking Status: never (04/01/2010) Passive Smoke Exposure: no (04/01/2010)  Past medical, surgical, family and social histories (including risk factors) reviewed, and no changes noted (except as noted below).  Past Medical History: Reviewed history from 06/10/2009 and no changes required. Asthma......Marland KitchenDr Charna Archer > dxed 2002. On QVAR since then. 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 Thyroidectomy partial Coronary artery bypass graft Cholecystectomy Cervical fusion Cervical laminectomy 05/26/06 Athroscopy right knee 04/2008  daldorf  Family History: Reviewed history from 02/28/2009 and no changes required. father Family History of CAD Male 1st degree relative <50 mother passed a 53 No FH of Colon Cancer:  Social History: Reviewed history from 05/01/2009 and no changes required. Married Former Smoker, quit in 1994 x 35 years avg. >1 ppd Retired Alcohol Use - yes 2 per week Daily Caffeine Use Illicit Drug Use - no Exercise regular  Review of Systems  The patient denies anorexia, fever,  weight loss, weight gain, vision loss, decreased hearing, hoarseness, chest pain, syncope, dyspnea on exertion, peripheral edema, prolonged cough, headaches, hemoptysis, abdominal pain, melena, hematochezia, severe indigestion/heartburn, hematuria, incontinence, genital sores, muscle weakness, suspicious skin lesions, transient blindness, difficulty walking, depression, unusual weight change, abnormal bleeding, enlarged lymph nodes, angioedema, breast masses, and testicular masses.    Physical Exam  General:  Well-developed,well-nourished,in no acute distress; alert,appropriate and cooperative throughout examination Head:  normocephalic, atraumatic, and male-pattern balding.   Eyes:  anicteric Ears:  hearing  aids removed.  Tympanic membranes and canals clear Neck:  No deformities, masses, or tenderness noted. Lungs:  Normal respiratory effort, chest expands symmetrically. Lungs are clear to auscultation, no crackles or wheezes. Heart:  normal rate  and regular rhythm.   Abdomen:  soft and non-tender.   Extremities:  trace left pedal edema and trace right pedal edema.   Neurologic:  alert & oriented X3 and gait normal.   Psych:  Oriented X3, memory intact for recent and remote, normally interactive, good eye contact, and slightly anxious.     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 Suspect greater that 10 years life expectancy Given this, recommend more "curative" approach rather that paliative. Robotic surgery appropriate. Await bone scan but if cards agrees woud pursue agressive therapy  Problem # 2:  HYPERTENSION (ICD-401.9)  His updated medication list for this problem includes:    Benicar Hct 20-12.5 Mg Tabs (Olmesartan medoxomil-hctz) .[name]... 1 once daily  BP  today: 140/80 recheck 120/77 Prior BP: 112/64 (02/18/2010)  Prior 10 Yr Risk Heart Disease: N/A (12/14/2006)  Labs Reviewed: K+: 3.8 (02/18/2010) Creat: : 1.5 (02/18/2010)   Chol: 152 (07/04/2009)   HDL: 44.20 (07/04/2009)   LDL: 56 (10/12/2008)   TG: 113.0 (10/12/2008)  Problem # 3:  HYPERLIPIDEMIA (ICD-272.4) Assessment: Unchanged  His updated medication list for this problem includes:    Crestor 10 Mg Tabs (Rosuvastatin calcium) .[name]... Take 1 tablet by mouth q hs  Labs Reviewed: SGOT: 28 (02/18/2010)   SGPT: 26 (02/18/2010)  Prior 10 Yr Risk Heart Disease: N/A (12/14/2006)   HDL:44.20 (07/04/2009), 39.80 (10/12/2008)  LDL:56 (10/12/2008), 74 (04/28/2007)  Chol:152 (07/04/2009), 118 (10/12/2008)  Trig:113.0 (10/12/2008), 137 (04/28/2007)  Problem # 4:  CORONARY ARTERY DISEASE (ICD-414.00) Assessment: Unchanged  His updated medication list for this problem includes:    Benicar Hct 20-12.5 Mg Tabs (Olmesartan medoxomil-hctz) .[name]... 1 once daily    Bl Adult Aspirin Low Strength 81 Mg Chew (Aspirin) ..... Once daily    Nitrostat 0.4 Mg Subl (Nitroglycerin) .[name]... 1 tablet under tongue at onset of chest pain; you may repeat every 5 minutes for up to 3 doses.  Complete Medication List: 1)  Crestor 10 Mg Tabs (Rosuvastatin calcium) .... Take 1 tablet by mouth q hs 2)  Benicar Hct 20-12.5 Mg Tabs (Olmesartan medoxomil-hctz) .[name].. 1 once daily 3)  Clarinex 5 Mg Tabs (Desloratadine) .... One by mouth daily 4)  Bl Adult Aspirin Low Strength 81 Mg Chew (Aspirin) .... Once daily 5)  Vitamin B-12 100 Mcg Tabs (Cyanocobalamin) .... One p.o. daily over-the-counter 6)  Flonase 50 Mcg/act Susp (Fluticasone propionate) .... 2 spray each nostril once daily 7)  Folic Acid 1 Mg Tabs (Folic acid) .... Once daily 8)  Mirapex 0.125 Mg Tabs (Pramipexole dihydrochloride) .... One at bedtime as needed 9)  Valium 5 Mg Tabs (Diazepam) .... At bedtime as needed 10)  Prilosec 20 Mg Cpdr (Omeprazole) .... One by  mouth two times a day with samples 11)  Lac-hydrin Five 5 % Lotn (Ammonium lactate) .... Apply to affected area two times a day as needed 12)  Qvar 80 Mcg/act Aers (Beclomethasone dipropionate) .... Two puffs by mouth daily 13)  Proair Hfa 108 (90 Base) Mcg/act Aers (Albuterol sulfate) .... Wo pudd by mouth  q 6 hour prn 14)  Benefiber Powd (Wheat dextrin) .... Mix powder in water. 15)  Nitrostat 0.4 Mg Subl (Nitroglycerin) .[name].. 1 tablet under tongue at onset of chest pain; you may repeat every 5 minutes for up to 3 doses. 16)  Miralax  Powd (Polyethylene glycol 3350) .... Daily 17)  Align Caps (Probiotic product) .[name].. 1 by mouth daily 18)  Ultram Er 100 Mg Xr24h-tab (Tramadol hcl) .... Take one by mouth once daily  Patient Instructions: 1)  consider both cryo or robotic therapy   Orders Added: 1)  Est. Patient Level IV [16109]

## 2010-04-24 NOTE — Progress Notes (Signed)
Summary: Pt coming in to discuss other treatment options  Phone Note Call from Patient   Caller: Patient Call For: Stacie Glaze MD Summary of Call: gleason score is 7. Dr. Vonita Moss saw pt, and diagnosed prostate cancer.  He would like to discuss this with Dr. Lovell Sheehan after he meets with Dr. Vonita Moss today. A bone scan has also been ordered.   REALLY wants to speak to Dr. Lovell Sheehan about treatment options.  Please call. Initial call taken by: Lynann Beaver CMA AAMA,  March 28, 2010 9:19 AM  Follow-up for Phone Call        may be seen tuesday 1-10 at 12:30 Follow-up by: Willy Eddy, LPN,  March 28, 2010 10:25 AM  Additional Follow-up for Phone Call Additional follow up Details #1::        Called pt and he said he would have to call back to sch. Waiting on call back.  Additional Follow-up by: Lucy Antigua,  March 28, 2010 11:02 AM    Additional Follow-up for Phone Call Additional follow up Details #2::    Pt called back and has been sch for 04/01/10 at 12:30, as noted above.  Follow-up by: Lucy Antigua,  March 28, 2010 11:29 AM

## 2010-04-29 ENCOUNTER — Other Ambulatory Visit: Payer: Self-pay | Admitting: Urology

## 2010-04-29 ENCOUNTER — Encounter (HOSPITAL_COMMUNITY): Payer: Medicare Other

## 2010-04-29 ENCOUNTER — Ambulatory Visit (HOSPITAL_COMMUNITY)
Admission: RE | Admit: 2010-04-29 | Discharge: 2010-04-29 | Disposition: A | Discharge status: Home / Self Care | Payer: Medicare Other | Source: Ambulatory Visit | Attending: Urology | Admitting: Urology

## 2010-04-29 DIAGNOSIS — I2510 Atherosclerotic heart disease of native coronary artery without angina pectoris: Secondary | ICD-10-CM | POA: Insufficient documentation

## 2010-04-29 DIAGNOSIS — Z01818 Encounter for other preprocedural examination: Secondary | ICD-10-CM

## 2010-04-29 DIAGNOSIS — C61 Malignant neoplasm of prostate: Secondary | ICD-10-CM | POA: Insufficient documentation

## 2010-04-29 LAB — SURGICAL PCR SCREEN: Staphylococcus aureus: POSITIVE — AB

## 2010-04-29 LAB — BASIC METABOLIC PANEL
BUN: 17 mg/dL (ref 6–23)
CO2: 27 mEq/L (ref 19–32)
Calcium: 9.6 mg/dL (ref 8.4–10.5)
Chloride: 100 mEq/L (ref 96–112)
Creatinine, Ser: 1.64 mg/dL — ABNORMAL HIGH (ref 0.4–1.5)
GFR calc Af Amer: 51 mL/min — ABNORMAL LOW (ref >60)
GFR calc non Af Amer: 42 mL/min — ABNORMAL LOW (ref >60)
Glucose, Bld: 90 mg/dL (ref 70–99)
Potassium: 4.5 mEq/L (ref 3.5–5.1)
Sodium: 135 mEq/L (ref 135–145)

## 2010-04-29 LAB — CBC
HCT: 40.9 % (ref 39.0–52.0)
Hemoglobin: 14.6 g/dL (ref 13.0–17.0)
MCH: 31.2 pg (ref 26.0–34.0)
MCHC: 35.7 g/dL (ref 30.0–36.0)
MCV: 87.4 fL (ref 78.0–100.0)
Platelets: 191 10*3/uL (ref 150–400)
RBC: 4.68 MIL/uL (ref 4.22–5.81)
RDW: 12.5 % (ref 11.5–15.5)
WBC: 7 10*3/uL (ref 4.0–10.5)

## 2010-04-30 NOTE — Letter (Signed)
Summary: [facility]  [facility]   Imported By: Maryln Gottron 04/21/2010 14:50:16  _____________________________________________________________________  External Attachment:    Type:   Image     Comment:   External Document

## 2010-05-07 ENCOUNTER — Other Ambulatory Visit: Payer: Self-pay | Admitting: Internal Medicine

## 2010-05-07 NOTE — Telephone Encounter (Signed)
Scheduled for prostate surgery tomorrow.  Dr Annabell Howells has cancelled surgery because he has a detached retina.  Who is another surgeon that Dr. Lovell Sheehan can recommend.  BP is elevated, going to [facility] today, would like to stop in to get bp checked.  Please advise.

## 2010-05-08 ENCOUNTER — Other Ambulatory Visit: Payer: Self-pay | Admitting: Urology

## 2010-05-08 ENCOUNTER — Inpatient Hospital Stay (HOSPITAL_COMMUNITY)
Admission: RE | Admit: 2010-05-08 | Discharge: 2010-05-09 | DRG: 708 | Disposition: A | Discharge status: Home / Self Care | Payer: Medicare Other | Source: Ambulatory Visit | Attending: Urology | Admitting: Urology

## 2010-05-08 DIAGNOSIS — J45909 Unspecified asthma, uncomplicated: Secondary | ICD-10-CM | POA: Diagnosis present

## 2010-05-08 DIAGNOSIS — Z951 Presence of aortocoronary bypass graft: Secondary | ICD-10-CM

## 2010-05-08 DIAGNOSIS — C61 Malignant neoplasm of prostate: Principal | ICD-10-CM | POA: Diagnosis present

## 2010-05-08 DIAGNOSIS — G4733 Obstructive sleep apnea (adult) (pediatric): Secondary | ICD-10-CM | POA: Diagnosis present

## 2010-05-08 DIAGNOSIS — I252 Old myocardial infarction: Secondary | ICD-10-CM

## 2010-05-08 DIAGNOSIS — I2510 Atherosclerotic heart disease of native coronary artery without angina pectoris: Secondary | ICD-10-CM | POA: Diagnosis present

## 2010-05-08 LAB — BASIC METABOLIC PANEL
BUN: 11 mg/dL (ref 6–23)
CO2: 26 mEq/L (ref 19–32)
Calcium: 8.9 mg/dL (ref 8.4–10.5)
Chloride: 98 mEq/L (ref 96–112)
Creatinine, Ser: 1.51 mg/dL — ABNORMAL HIGH (ref 0.4–1.5)
GFR calc Af Amer: 56 mL/min — ABNORMAL LOW (ref >60)
GFR calc non Af Amer: 46 mL/min — ABNORMAL LOW (ref >60)
Glucose, Bld: 136 mg/dL — ABNORMAL HIGH (ref 70–99)
Potassium: 4.1 mEq/L (ref 3.5–5.1)
Sodium: 130 mEq/L — ABNORMAL LOW (ref 135–145)

## 2010-05-08 LAB — ABO/RH: ABO/RH(D): O NEG

## 2010-05-08 LAB — TYPE AND SCREEN
ABO/RH(D): O NEG
Antibody Screen: NEGATIVE

## 2010-05-08 LAB — HEMOGLOBIN AND HEMATOCRIT, BLOOD
HCT: 37.2 % — ABNORMAL LOW (ref 39.0–52.0)
Hemoglobin: 13.8 g/dL (ref 13.0–17.0)

## 2010-05-09 LAB — BASIC METABOLIC PANEL
BUN: 7 mg/dL (ref 6–23)
CO2: 27 mEq/L (ref 19–32)
Calcium: 8.6 mg/dL (ref 8.4–10.5)
Chloride: 102 mEq/L (ref 96–112)
Creatinine, Ser: 1.37 mg/dL (ref 0.4–1.5)
GFR calc Af Amer: >60 mL/min (ref >60)
GFR calc non Af Amer: 52 mL/min — ABNORMAL LOW (ref >60)
Glucose, Bld: 117 mg/dL — ABNORMAL HIGH (ref 70–99)
Potassium: 4.4 mEq/L (ref 3.5–5.1)
Sodium: 134 mEq/L — ABNORMAL LOW (ref 135–145)

## 2010-05-09 LAB — HEMOGLOBIN AND HEMATOCRIT, BLOOD
HCT: 38.9 % — ABNORMAL LOW (ref 39.0–52.0)
Hemoglobin: 13.3 g/dL (ref 13.0–17.0)

## 2010-05-12 NOTE — Discharge Summary (Signed)
  Joshua [name], Joshua [name]             ACCOUNT NO.:  [id number]  MEDICAL RECORD NO.:  [id number]           PATIENT TYPE:  I  LOCATION:  1405                         FACILITY:  [facility]  PHYSICIAN:  Valetta Fuller, M.D.  DATE OF BIRTH:  05/11/1940  DATE OF ADMISSION:  05/08/2010 DATE OF DISCHARGE:  05/09/2010                              DISCHARGE SUMMARY   ADMISSION DIAGNOSIS:  Clinically localized adenocarcinoma of the prostate.  DISCHARGE DIAGNOSIS:  Clinically localized adenocarcinoma of prostate.  PROCEDURES: 1. Robotic-assisted laparoscopic radical prostatectomy (right nerve     sparing). 2. Bilateral pelvic lymphadenectomy laparoscopically.  HISTORY AND PHYSICAL:  For full details, please see admission history and physical.  Briefly, Mr. [name] is a 69 year old gentleman who was found to have clinically localized adenocarcinoma of prostate.  After careful consideration regarding management options for treatment, he elected to proceed with surgical therapy and a robotic-assisted laparoscopic radical prostatectomy.  HOSPITAL COURSE:  On May 08, 2010, he was taken to the operating room where he underwent the above named procedures which he tolerated well and without complications.  Postoperatively, he was able to be transferred to regular hospital room where he was able to begin ambulation and a clear liquid diet.  Postoperatively, he was found to be hemodynamically stable as noted by hemoglobin of 13.8.  He was also found to be hemodynamically stable on postoperative day #1 as noted by hemoglobin of 13.3.  He was found to have excellent urine output with minimal output from his pelvic drain.  Therefore, on postoperative day #1, his pelvic drain was removed.  On postoperative day #1, he was able to be transitioned to oral pain medications and continued to ambulate. On the afternoon of postoperative day #1, he was ambulating without difficulty.  His pain was managed with  narcotic pain reliever, and he was tolerating a clear liquid diet.  He was therefore felt stable for discharge home as he had met all discharge criteria.  DISPOSITION:  Home.  DISCHARGE INSTRUCTIONS:  He was instructed to be ambulatory but specifically told to refrain from any heavy lifting, strenuous activity or driving.  He was instructed on routine Foley catheter care and told to gradually advance his diet over the course of the next few days.  DISCHARGE MEDICATIONS:  He was instructed to resume all home medications.  He was instructed to hold his herbal supplements for 7 days.  He was given a prescription for Percocet to use as needed for pain and told to use Colace as a stool softener.  In addition, he was given a prescription for Cipro to begin 1 day prior to return visit for removal of Foley catheter.  FOLLOWUP:  He will follow up in 7-10 days for further postoperative evaluation consisting of skin staple removal and Foley catheter removal.     Delia Chimes, NP   ______________________________ Valetta Fuller, M.D.    MA/MEDQ  D:  05/09/2010  T:  05/09/2010  Job:  [id number]  Electronically Signed by Delia Chimes NP on 05/11/2010 08:19:12 PM Electronically Signed by Barron Alvine M.D. on 05/12/2010 12:03:12 PM

## 2010-05-20 NOTE — Letter (Signed)
Summary: [facility] Office Visit   [facility] Office Visit   Imported By: Roderic Ovens 05/14/2010 12:36:05  _____________________________________________________________________  External Attachment:    Type:   Image     Comment:   External Document

## 2010-05-30 NOTE — Op Note (Signed)
NAMEJOHNGABRIEL, VERDE             ACCOUNT NO.:  [id number]  MEDICAL RECORD NO.:  [id number]           PATIENT TYPE:  I  LOCATION:  1405                         FACILITY:  [facility]  PHYSICIAN:  Valetta Fuller, M.D.  DATE OF BIRTH:  October 22, 1940  DATE OF PROCEDURE:  05/08/2010 DATE OF DISCHARGE:  05/09/2010                              OPERATIVE REPORT   PREOPERATIVE DIAGNOSIS:  Adenocarcinoma of the prostate.  POSTOPERATIVE DIAGNOSIS:  Adenocarcinoma of the prostate.  PROCEDURE PERFORMED:  Robotic-assisted laparoscopic radical retropubic prostatectomy with bilateral pelvic lymph node dissection.  SURGEON:  Valetta Fuller, M.D.  ASSISTANT:  Delia Chimes, NP  ANESTHESIA:  General endotracheal.  INDICATIONS:  Mr. Bialecki is 70 years of age.  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  The patient's PSA was not elevated.  The patient underwent ultrasound and biopsy.  A digital rectal exam did reveal induration in the entire left lobe of the prostate.  Pathology revealed 6 out of 6 cores positive for adenocarcinoma on the left side and 0 out of 6 on the right.  The patient had a mixture of both Gleason 3+4=7 as well as 3+3=6 adenocarcinoma of the prostate.  The patient underwent extensive discussion with Dr. Annabell Howells.  He went over the nomograms which predicted approximately a 70% chance of organ-confined disease.  The patient's preoperative IPSS score was 11 and his preoperative SHIM score was 14.  After a lengthy discussion about treatment options, the patient chose a surgical approach.  He was on the schedule for Dr. Annabell Howells to perform robotic prostatectomy.  Dr. Annabell Howells subsequently developed an acute eye emergency which made it physically impossible for him to do the surgery.  Mr. Poblete was told of the situation and elected to have [place] step in as his operative surgeon.  Again, we discussed things preoperatively and he  appeared to have significant understanding with regard to treatment options.  The plan was to do a limited nerve spare on the right side and wide excision on the left.  The patient has performed a mechanical bowel prep.  He has received perioperative antibiotics and placement of PAS compression boots.  He presents now for the procedure.  TECHNIQUE AND FINDINGS:  The patient was brought to the operating room where he had successful induction of general endotracheal anesthesia. Appropriate surgical time-out was performed.  He was placed in lithotomy position and prepped and draped in the usual manner.  All extremities were carefully padded and he was secured to the operative table and placed in a steep Trendelenburg position.  Foley catheter was inserted sterilely on the field.  Camera port incision was chosen 18 cm above the pubic symphysis just to the left of the umbilicus.  Standard open Hasson technique was utilized and there was no difficulty or complications placing the initial 12 mm trocar or insufflating the abdomen. Examination of the pelvis revealed some adhesions in the left lower quadrant.  Nothing that would preclude successful surgical procedure. Additional trocars were placed with direct visual guidance including a 12 mm and 5 mm assist ports and  three 8 mm robotic trocars.  Once all trocars were positioned, the surgical cart was docked.  The bladder was filled and the space of Retzius developed.  Superficial fat off the bladder neck and endopelvic fascia was taken with electrocautery scissors.  The endopelvic fascia was incised widely from base to apex. The levator musculature was carefully swept off the apex of the prostate isolating the dorsal venous complex which was then stapled with an ETS stapling device with good hemostasis.  The anterior bladder neck was identified with the aid of the Foley balloon.  The anterior bladder neck fibers were transected down to  the catheter which was then retracted anteriorly.  We were well away from the ureteral orifices which were identified with indigo carmine.  There did not appear to be a middle lobe component, and the posterior bladder neck was then transected.  This dissection was carried down to the vas deferens and seminal vesicles which were individually dissected free. With the prostate retracted anteriorly, we were able to easily establish the posterior plane with blunt dissecting technique.  The patient was not felt to be a candidate for nerve spare on the left side.  On that side, the additional bladder neck pedicle and neurovascular bundle were taken widely utilizing the EnSeal device.  On the right side, superficial fascia overlying the anterior aspect of the prostate was incised and then swept posterolaterally until a nice groove was established between the prostatic capsule and the neurovascular bundle. The vascular pedicle was then taken down with an EnSeal device.  The anterior urethra was then transected, identifying the Foley catheter which was then retracted and the posterior urethra was then transected. A small amount of rectourethralis muscle was incised with cold scissors and the prostate specimen was removed.  The pelvis was copiously irrigated, and there was no evidence of rectal injury on insufflation.  Attention was then turned towards bilateral pelvic lymph node dissection.  Standard obturator packets extending up to the bifurcation of the common iliac artery were taken bilaterally.  Obturator nerves were identified bilaterally and small veins and lymphatics were clipped with Hem-o-lok clips.  The individual lymph node packets were sent for permanent analysis.  Attention was then turned towards reconstruction.  Again, indigo carmine was given.  The bladder neck required no additional closure.  The 6 o'clock urethral position and 6 o'clock bladder neck position  were reapproximated utilizing a single 2-0 Vicryl suture.  The rest of the anastomosis was done with a double-armed 3-0 Monocryl suture in a running 360-degree manner.  Foley catheter was reinserted without difficulty and irrigation revealed no evidence of leakage.  A pelvic drain was placed through one of the robotic trocars, secured to the skin and placed in the retropubic space.  The prostate specimen was placed in an Endopouch retrieval bag.  The 12 mm trocar site was closed with Vicryl suture with the aid of a suture passer.  All other trocars were taken out with direct visual guidance.  The camera port incision was extended slightly to allow for removal of the specimen and that fascia was closed with a running Vicryl suture.  All port sites were infiltrated with Marcaine and closed with clips.  The bladder irrigated a light blue color without hematuria at the completion of the procedure. Blood loss was 100-200 cc, and the patient had no obvious complications or problems and was brought to recovery room in stable condition.     Valetta Fuller, M.D.  DSG/MEDQ  D:  05/13/2010  T:  05/14/2010  Job:  [id number]  Electronically Signed by Barron Alvine M.D. on 05/30/2010 01:46:15 PM

## 2010-06-17 ENCOUNTER — Encounter: Payer: Self-pay | Admitting: Internal Medicine

## 2010-06-19 ENCOUNTER — Encounter: Payer: Self-pay | Admitting: Internal Medicine

## 2010-06-19 ENCOUNTER — Ambulatory Visit (INDEPENDENT_AMBULATORY_CARE_PROVIDER_SITE_OTHER): Payer: Medicare Other | Admitting: Internal Medicine

## 2010-06-19 DIAGNOSIS — I10 Essential (primary) hypertension: Secondary | ICD-10-CM

## 2010-06-19 DIAGNOSIS — C61 Malignant neoplasm of prostate: Secondary | ICD-10-CM

## 2010-06-19 DIAGNOSIS — J45909 Unspecified asthma, uncomplicated: Secondary | ICD-10-CM

## 2010-06-19 DIAGNOSIS — D509 Iron deficiency anemia, unspecified: Secondary | ICD-10-CM

## 2010-06-19 LAB — CBC WITH DIFFERENTIAL/PLATELET
Basophils Absolute: 0 10*3/uL (ref 0.0–0.1)
Basophils Relative: 0.5 % (ref 0.0–3.0)
Eosinophils Absolute: 0.4 10*3/uL (ref 0.0–0.7)
Eosinophils Relative: 5.1 % — ABNORMAL HIGH (ref 0.0–5.0)
HCT: 39.3 % (ref 39.0–52.0)
Hemoglobin: 13.4 g/dL (ref 13.0–17.0)
Lymphocytes Relative: 17.4 % (ref 12.0–46.0)
Lymphs Abs: 1.3 10*3/uL (ref 0.7–4.0)
MCHC: 34.2 g/dL (ref 30.0–36.0)
MCV: 91.3 fl (ref 78.0–100.0)
Monocytes Absolute: 0.8 10*3/uL (ref 0.1–1.0)
Monocytes Relative: 10.3 % (ref 3.0–12.0)
Neutro Abs: 4.9 10*3/uL (ref 1.4–7.7)
Neutrophils Relative %: 66.7 % (ref 43.0–77.0)
Platelets: 178 10*3/uL (ref 150.0–400.0)
RBC: 4.3 Mil/uL (ref 4.22–5.81)
RDW: 12.8 % (ref 11.5–14.6)
WBC: 7.3 10*3/uL (ref 4.5–10.5)

## 2010-06-19 NOTE — Progress Notes (Signed)
Subjective:    Patient ID: Joshua [name], male    DOB: 05-23-1940, 70 y.o.   MRN: [id number]  HPI Had complications form his prostate surgery with bleeding and pain. His surgeon had a torn retina and he had surgery with someone that he had not seen in the past.    Review of Systems  Constitutional: Negative for fever and fatigue.  HENT: Negative for hearing loss, congestion, neck pain and postnasal drip.   Eyes: Negative for discharge, redness and visual disturbance.  Respiratory: Negative for cough, shortness of breath and wheezing.   Cardiovascular: Negative for leg swelling.  Gastrointestinal: Negative for abdominal pain, constipation and abdominal distention.  Genitourinary: Positive for dysuria, urgency, decreased urine volume and difficulty urinating. Negative for frequency.  Musculoskeletal: Negative for joint swelling and arthralgias.  Skin: Negative for color change and rash.  Neurological: Negative for weakness and light-headedness.  Hematological: Negative for adenopathy.  Psychiatric/Behavioral: Negative for behavioral problems.   Past Medical History  Diagnosis Date  . Asthma   . CAD (coronary artery disease)   . Depression   . GERD (gastroesophageal reflux disease)   . Hyperlipidemia   . IBS (irritable bowel syndrome)   . Acute medial meniscal tear   . DJD (degenerative joint disease)   . Hypertension   . Anemia   . Diverticulosis   . Obesity   . Pancreatitis   . PUD (peptic ulcer disease)   . Pneumonia   . Sleep apnea   . Prostate cancer    Past Surgical History  Procedure Date  . Thyroidectomy, partial   . Coronary artery bypass graft   . Cholecystectomy   . Cervical fusion   . Cervical laminectomy 05/26/06  . Arthroscopy rt knee 04/2008    daldoerf    reports that he quit smoking about 18 years ago. He does not have any smokeless tobacco history on file. He reports that he does not drink alcohol or use illicit drugs. family history includes  Heart disease in his father. Allergies  Allergen Reactions  . Bromfed     REACTION: n\T\v  . Clarithromycin     REACTION: gastritis  . Codeine   . Doxycycline Hyclate     REACTION: n\T\v  . Erythromycin Base     REACTION: rash  . Hydrocodone-Acetaminophen     REACTION: rash  . Modafinil     REACTION: anxiety-nervousness  . Promethazine Hcl     REACTION: fainting  . Sulfonamide Derivatives     REACTION: rash  . Telithromycin     REACTION: nausea       Objective:   Physical Exam  Constitutional: He appears well-developed and well-nourished.  HENT:  Head: Normocephalic and atraumatic.  Eyes: Conjunctivae are normal. Pupils are equal, round, and reactive to light.  Neck: Normal range of motion. Neck supple.  Cardiovascular: Normal rate and regular rhythm.   Pulmonary/Chest: Effort normal and breath sounds normal.  Abdominal: Soft. Bowel sounds are normal.  Genitourinary:       Pain in prostate area          Assessment & Plan:   I have spent more than 30 minutes examining this patient face-to-face of which over half was spent in counseling We discussed his prostate cancer and the fact that if there is metastatic disease disease to the bladder wall she showed an elevated PSA at he has lots of questions about what would be the next at this we discussed that we should wait until the  PSA comes back to make any decisions as these may not be necessary if the prostate cancers completely removed and the surgery

## 2010-06-19 NOTE — Assessment & Plan Note (Signed)
monitoring CBC with differential

## 2010-06-19 NOTE — Assessment & Plan Note (Signed)
Patient had prostate surgery February 16 had complications with bleeding around the catheter and extreme prostate pain he was monitored by the urologist for infection apparently the urinalysis did not show any infection leak however he did have persistent pain and swelling in the area the pain is lessened although it is persistent he has been on hydromorphone 2 mg as needed for pain 6 hours.   Urine flow is returned to normal except for a left deviation and and has mild incontinence he is doing the pelvic exercises for the pelvic floor. In a started Kegel exercises. Has some persistent burning with urination.

## 2010-06-19 NOTE — Assessment & Plan Note (Signed)
Stable asthma

## 2010-06-24 ENCOUNTER — Telehealth: Payer: Self-pay | Admitting: *Deleted

## 2010-06-24 NOTE — Telephone Encounter (Signed)
Pt was given CBC values.

## 2010-07-06 ENCOUNTER — Other Ambulatory Visit: Payer: Self-pay | Admitting: Internal Medicine

## 2010-07-07 ENCOUNTER — Other Ambulatory Visit: Payer: Self-pay | Admitting: *Deleted

## 2010-07-07 MED ORDER — OMEPRAZOLE 20 MG PO CPDR: 20.0000 mg | DELAYED_RELEASE_CAPSULE | Freq: Two times a day (BID) | ORAL | Status: DC

## 2010-08-05 NOTE — Assessment & Plan Note (Signed)
[facility]                                 ON-CALL NOTE   Joshua [name], Joshua [name]                      MRN:          [id number]  DATE:03/26/2007                            DOB:          March 01, 1941    ON-CALL NOTE   Phone number:  [phone].  Date of Birth:  09-Jul-1940  Date of Call:  March 26, 2007  Primary care physician:  Dr. Lovell Sheehan.   SUBJECTIVE:  Sore throat.   ASSESSMENT/PLAN:  The patient told to come into Saturday clinic.     Kerby Nora, MD  Electronically Signed    AB/MedQ  DD: 03/26/2007  DT: 03/26/2007  Job #: [id number]

## 2010-08-05 NOTE — Procedures (Signed)
NAMEZACKARI, RUANE             ACCOUNT NO.:  [id number]   MEDICAL RECORD NO.:  [id number]          PATIENT TYPE:  OUT   LOCATION:  SLEEP CENTER                 FACILITY:  [facility]   PHYSICIAN:  Barbaraann Share, MD,FCCPDATE OF BIRTH:  10-03-1942   DATE OF STUDY:  02/01/2008                            NOCTURNAL POLYSOMNOGRAM   REFERRING PHYSICIAN:   REFERRING PHYSICIAN:  Stacie Glaze, MD.   INDICATIONS FOR THE STUDY:  Hypersomnia with sleep apnea.   EPWORTH SCORE:  6.   SLEEP ARCHITECTURE:  The patient had a total sleep time of 363 minutes  with no slow wave sleep and decreased quantity of REM.  Sleep onset  latency was normal as was REM onset.  Sleep efficiency was mildly  decreased at 85%.   RESPIRATORY DATA:  The patient was found to have 11 obstructive apneas  and 35 hypopneas for an AHI of 8 events per hour.  He was also noted to  have 50 respiratory effort-related arousals, giving him a RDI of 16  events per hour.  The events were more frequent in the supine position,  there was loud snoring noted throughout.   OXYGEN DATA:  There was transient O2 desaturation as low as 82% with the  patient's obstructive events.   CARDIAC DATA:  Rare PAC noted, but no clinically significant arrhythmias  seen.   MOVEMENT/PARASOMNIA:  The patient had small numbers of periodic leg  movements without significant arousal or awakening.  There were no  abnormal behaviors noted.   IMPRESSION/RECOMMENDATION:  1. Mild obstructive sleep apnea/hypopnea syndrome, with an apnea-      hypopnea index of 8 events per hour and a respiratory disturbance      index of 16 events per hour.  There was transient O2 desaturation      as low as 82%.  Treatment for this degree of sleep apnea can      include weight loss alone if applicable, upper airway surgery,      oral appliance, and CPAP.  Clinical correlation is suggested.  2. Rare PACs noted, but no clinically significant arrhythmias seen.      Barbaraann Share, MD,FCCP  Diplomate, American Board of Sleep  Medicine  Electronically Signed     [facility]/MEDQ  D:  02/14/2008 16:24:13  T:  02/15/2008 05:31:37  Job:  [id number]

## 2010-08-05 NOTE — Assessment & Plan Note (Signed)
[facility]                            CARDIOLOGY OFFICE NOTE   Joshua [name]                    MRN:          [id number]  DATE:01/31/2008                            DOB:          [birth year]    PRIMARY CARE PHYSICIAN:  Joshua Glaze, MD   REASON FOR PRESENTATION:  Evaluate the patient with coronary artery  disease.   HISTORY OF PRESENT ILLNESS:  The patient is 70 years old.  He presents  for followup of the above.  He has done well over the last 2 years.  He  has had no new cardiovascular symptoms.  He has remained active.  He was  exercising, although he has been working a little bit more part-time and  had an exercise recently.  He has done a lot of walking on his job.  He  denies any chest discomfort, neck or arm discomfort.  He has had no  palpitation, presyncope, or syncope.  He has had no PND or orthopnea.  He has noticed some sporadic discomfort in his chest that is the left  upper discomfort.  It is kind of sharp pain.  It is kind of mild-to-  moderate.  It did happen without exertion.  It does not really associate  with food.  It goes away spontaneously.  It goes away with antacids.  It  may last for few minutes.  It is not like his previous angina.  He has  no associated symptoms and no radiation of the discomfort.   PAST MEDICAL HISTORY:  Coronary artery disease, status post CABG (LIMA  to the LAD, sequential SVG to intermediate 1 and 2, SVG to obtuse  marginal, SVG to the right coronary artery), postoperative anemia, mild  renal insufficiency postoperatively, anxiety, bronchospasm,  dyslipidemia.   ALLERGIES:  SULFA.   MEDICATIONS:  1. Vitamin B.  2. Aspirin 81 mg daily.  3. Meloxicam.  4. Benicar HCT 20/12.5 daily.  5. Crestor 10 mg daily.  6. Nasonex.  7. Pulmicort.   REVIEW OF SYSTEMS:  As stated in the HPI and otherwise negative for  other systems.   PHYSICAL EXAMINATION:  GENERAL:  The patient is in no  distress.  VITAL SIGNS:  Blood pressure 129/84, heart rate 83 and regular, weight  196 pounds, body mass index 31.  HEENT:  Eyelids are unremarkable; pupils are equal, round and reactive  to light; fundi not visualized; oral mucosa unremarkable.  NECK:  No jugular venous distention at 45 degrees; carotid upstroke  brisk and symmetric; no bruits, no thyromegaly.  LYMPHATICS:  No cervical, axillary, or inguinal adenopathy.  LUNGS:  Clear to auscultation bilaterally.  BACK:  No costovertebral angle tenderness.  CHEST:  Unremarkable.  HEART:  PMI not displaced or sustained; S1 and S2 within normal limits;  no S3, no S4, no clicks, no rubs, no murmurs.  ABDOMEN:  Mildly obese; positive bowel sounds normal in frequency and  pitch; no bruits, no rebound, no guarding, no midline pulsatile mass, no  hepatomegaly, no splenomegaly.  SKIN:  No rashes, no nodules.  EXTREMITIES:  2+ pulses throughout, no edema,  no cyanosis, no clubbing.  NEURO:  Oriented to person, place and time; cranial nerves II-XII are  grossly intact; motor grossly intact.   EKG sinus rhythm, rate 75, axis within normal limits, intervals within  normal limits, no acute ST-T wave changes.   ASSESSMENT AND PLAN:  1. Coronary artery disease.  The patient is having no new symptoms.      His bypass was in 2001.  He had last perfusion study in 2006.  At      this point, he will continue with secondary risk reduction.  I      think, the only thing lacking from this is a regular exercise      regimen and we talked about this.  I would plan to see him back      next year rather than every 2 years as next year his grafts will be      68 years old.  I will likely screen him per appropriateness      guidelines with stress perfusion study.  He knows to let me know if      his current chest discomfort changes in nature.  At this point, I      would think very unlikely to be angina based on the description.  2. Obesity.  We discussed the  need to lose with diet and exercise.  3. Dyslipidemia.  I reviewed with him his lipids and he has an      excellent lipid profile.  4. Hypertension.  Blood pressure is well-controlled.  We will continue      with the medications as listed.  5. Follow-up, as above.     Joshua Rotunda, MD, [facility]  Electronically Signed    JH/MedQ  DD: 01/31/2008  DT: 01/31/2008  Job #: [id number]   cc:   Joshua Glaze, MD

## 2010-08-08 ENCOUNTER — Ambulatory Visit (INDEPENDENT_AMBULATORY_CARE_PROVIDER_SITE_OTHER): Payer: Medicare Other | Admitting: Internal Medicine

## 2010-08-08 ENCOUNTER — Encounter: Payer: Self-pay | Admitting: Internal Medicine

## 2010-08-08 VITALS — BP 124/80 | HR 72 | Temp 98.8°F | Resp 16 | Ht 66.5 in | Wt 184.0 lb

## 2010-08-08 DIAGNOSIS — C61 Malignant neoplasm of prostate: Secondary | ICD-10-CM

## 2010-08-08 DIAGNOSIS — I10 Essential (primary) hypertension: Secondary | ICD-10-CM

## 2010-08-08 DIAGNOSIS — K219 Gastro-esophageal reflux disease without esophagitis: Secondary | ICD-10-CM

## 2010-08-08 DIAGNOSIS — F329 Major depressive disorder, single episode, unspecified: Secondary | ICD-10-CM

## 2010-08-08 DIAGNOSIS — E785 Hyperlipidemia, unspecified: Secondary | ICD-10-CM

## 2010-08-08 LAB — BASIC METABOLIC PANEL
BUN: 15 mg/dL (ref 6–23)
CO2: 29 mEq/L (ref 19–32)
Calcium: 9.5 mg/dL (ref 8.4–10.5)
Chloride: 100 mEq/L (ref 96–112)
Creatinine, Ser: 1.3 mg/dL (ref 0.4–1.5)
GFR: 57.92 mL/min — ABNORMAL LOW (ref >60.00)
Glucose, Bld: 75 mg/dL (ref 70–99)
Potassium: 5.2 mEq/L — ABNORMAL HIGH (ref 3.5–5.1)
Sodium: 136 mEq/L (ref 135–145)

## 2010-08-08 NOTE — Progress Notes (Signed)
Subjective:    Patient ID: Joshua [name], male    DOB: 1940/12/30, 70 y.o.   MRN: [id number]  HPI patient is a 70 year old white male presents for followup of recent prostate surgery for prostate cancer his incontinence has improved he has full bladder control at this time.  He also is followed for hypertension which has been well controlled with Benicar hydrochlorothiazide he has hyperlipidemia and has been on Crestor 10 mg by mouth daily he is tolerating the medications at this time he denies any increased shortness of breath any chest pain any PND orthopnea    Review of Systems  Constitutional: Negative for fever and fatigue.  HENT: Negative for hearing loss, congestion, neck pain and postnasal drip.   Eyes: Negative for discharge, redness and visual disturbance.  Respiratory: Negative for cough, shortness of breath and wheezing.   Cardiovascular: Negative for leg swelling.  Gastrointestinal: Negative for abdominal pain, constipation and abdominal distention.  Genitourinary: Negative for urgency and frequency.  Musculoskeletal: Negative for joint swelling and arthralgias.  Skin: Negative for color change and rash.  Neurological: Negative for weakness and light-headedness.  Hematological: Negative for adenopathy.  Psychiatric/Behavioral: Negative for behavioral problems.   Past Medical History  Diagnosis Date  . Asthma   . CAD (coronary artery disease)   . Depression   . GERD (gastroesophageal reflux disease)   . Hyperlipidemia   . IBS (irritable bowel syndrome)   . Acute medial meniscal tear   . DJD (degenerative joint disease)   . Hypertension   . Anemia   . Diverticulosis   . Obesity   . Pancreatitis   . PUD (peptic ulcer disease)   . Pneumonia   . Sleep apnea   . Prostate cancer    Past Surgical History  Procedure Date  . Thyroidectomy, partial   . Coronary artery bypass graft   . Cholecystectomy   . Cervical fusion   . Cervical laminectomy 05/26/06  .  Arthroscopy rt knee 04/2008    daldoerf    reports that he quit smoking about 18 years ago. He does not have any smokeless tobacco history on file. He reports that he does not drink alcohol or use illicit drugs. family history includes Heart disease in his father. Allergies  Allergen Reactions  . Bromfed     REACTION: n\T\v  . Clarithromycin     REACTION: gastritis  . Codeine   . Doxycycline Hyclate     REACTION: n\T\v  . Erythromycin Base     REACTION: rash  . Hydrocodone-Acetaminophen     REACTION: rash  . Modafinil     REACTION: anxiety-nervousness  . Promethazine Hcl     REACTION: fainting  . Sulfonamide Derivatives     REACTION: rash  . Telithromycin     REACTION: nausea       Objective:   Physical Exam  Constitutional: He is oriented to person, place, and time. He appears well-developed and well-nourished.  HENT:  Head: Normocephalic and atraumatic.  Eyes: Conjunctivae are normal. Pupils are equal, round, and reactive to light.  Neck: Normal range of motion. Neck supple.  Cardiovascular: Normal rate and regular rhythm.   Pulmonary/Chest: Effort normal and breath sounds normal.  Abdominal: Soft. Bowel sounds are normal.  Musculoskeletal: Normal range of motion.  Neurological: He is alert and oriented to person, place, and time.          Assessment & Plan:  Controlled hypertension on Benicar with a diuretic we'll obtain a basic metabolic panel  to measure her creatinine and potassium.  Stable hyperlipidemia on Crestor tolerating the medication well history of prostate cancer status post radical prostatectomy with control of his bladder after completing heel exercises and pelvic strengthening exercises will return in 3 months for monitoring of cholesterol

## 2010-08-08 NOTE — Op Note (Signed)
Joshua [name], Joshua [name]             ACCOUNT NO.:  [id number]   MEDICAL RECORD NO.:  [id number]          PATIENT TYPE:  INP   LOCATION:  3172                         FACILITY:  [facility]   PHYSICIAN:  Hilda Lias, M.D.   DATE OF BIRTH:  1940/10/15   DATE OF PROCEDURE:  05/26/2006  DATE OF DISCHARGE:                               OPERATIVE REPORT   PREOPERATIVE DIAGNOSIS:  C5-C6 and C6-C7 stenosis, calcification of  posterior ligament, chronic radiculopathy.   POSTOPERATIVE DIAGNOSES:  C5-C6 and C6-C7 stenosis, calcification of  posterior ligament, chronic radiculopathy.   PROCEDURE:  Anterior C5-C6 and C6-C7 discectomy, decompression of the  spinal cord, bilateral foraminotomy, interbody fusion with allograft,  plate, and microscope.   SURGEON:  Hilda Lias, M.D.   ASSISTANT:  Payton Doughty, M.D.   CLINICAL HISTORY:  The patient came to my office because for several  more he has been getting weakness and numbness in the fingers.  Clinically, we found that he had weakness of the right biceps and the  wrist and also weak in the muscle.  He has a Tinel's sign which is  positive.  X-rays show severe stenosis at the level of C5-C6 and C6-C7.  Surgery was advised and the risks were explained in the history and  physical.   DESCRIPTION OF PROCEDURE:  The patient was taken to the OR.  Previously,  this gentleman had sinus surgery.  The vocal cord on intubation were  normal.  Then, the skin was prepped with DuraPrep.  Then, a transverse  incision was made through the skin and subcutaneous tissue down to the  cervical spine.  The patient had two large osteophytes.  At the shoulder  we put a needle and the needle showed that we were at the level of C4-  C5.  From then on, we identified C5-C6 and C6-C7.  The patient had quite  a bit of calcification on the anterior ligament up to the point that we  were unable to enter a needle or the knife.  We had to drill the  anterior ligament just to  get into the soft tissue.  The incision was  made.  Large amounts of degenerative disc were removed.  The patient had  quite a bit of narrowing.  At the level of C5-C6 we found that the left  side was narrow and decompression after we opened the posterior ligament  was done using the 1 and 2 mm Kerrison punch.  On the right side, we  found a large calcified disc displacing the spinal cord posterior.  An  incision was made and the calcified disc was removed with decompression  of the spinal cord and the C6 nerve root.  At the level of C6-C7, we  found the same findings mostly with calcification along the posterior  ligament, worse in the left side than the right side.  Having good  decompression of the spinal cord and the nerve root, two pieces of  allograft of 7 mm was inserted followed by a plate with a six screws.  The x-ray showed that the upper part of the plate  and screw were  normal..  We were unable  to see the lower part.  Nevertheless, the area was irrigated.  Once we  had good hemostasis, the wound was closed.  Because he was taking  aspirin, we left a drain in the prevertebral area.  The patient did well  and he is going to go to the PACU.           ______________________________  Hilda Lias, M.D.     EB/MEDQ  D:  05/26/2006  T:  05/26/2006  Job:  [id number]

## 2010-08-08 NOTE — Op Note (Signed)
NAMETRESTAN, VAHLE             ACCOUNT NO.:  [id number]   MEDICAL RECORD NO.:  [id number]          PATIENT TYPE:  AMB   LOCATION:  SDS                          FACILITY:  [facility]   PHYSICIAN:  Thomas A. Cornett, M.D.DATE OF BIRTH:  [birth year]   DATE OF PROCEDURE:  07/29/2006  DATE OF DISCHARGE:                               OPERATIVE REPORT   PREOPERATIVE DIAGNOSIS:  Symptomatic cholelithiasis.   POSTOPERATIVE DIAGNOSIS:  Symptomatic cholelithiasis.   PROCEDURE:  Laparoscopic cholecystectomy with intraoperative  cholangiogram.   SURGEON:  Harriette Bouillon, M.D.   ASSISTANT:  Cyndia Bent, M.D.   ANESTHESIA:  General orotracheal anesthesia with 0.25% Marcaine local.   ESTIMATED BLOOD LOSS:  50 mL.   SPECIMEN:  Gallbladder to pathology.   INDICATIONS FOR PROCEDURE:  The patient is a 70 year old male with  symptomatic biliary colic.  He presents for elective laparoscopic  cholecystectomy for the above.   DESCRIPTION OF PROCEDURE:  The patient was brought to the operating  room, placed supine.  After induction of general anesthesia the abdomen  is prepped and draped in sterile fashion.  He received preoperative  antibiotics and orogastric tube was placed.  1 cm infraumbilical  incision was made.  Dissection was carried down to his fascia.  Fascia  was then opened with a scalpel and I used a small hemostat to open his  peritoneal lining.  Pursestring suture of 0-0 Vicryl was placed and a 12-  mm Hassan cannula was placed under direct vision.  Pneumoperitoneum was  created 15 mmHg of CO2.  Laparoscopy was performed.  Upon inspection I  saw no evidence of bowel or solid organ injury insertion of this trocar.  The subxiphoid port was then placed using 11 mm trocar under direct  vision.  Two 5 mL ports were placed both in the right upper abdominal  quarter under direct vision.  The gallbladder had dense omental  adhesions. We were able to pick up the gallbladder infundibulum  and the  dome and peel these omental adhesions away using scissors as well as  cautery.  The infundibulum was then better grasped and pulled toward the  patient's right lower quadrant with the dome toward the right shoulder.  The covering the cystic duct was dissected away to expose the cystic  duct.  Once this was dissected out a clip was placed in the gallbladder  side of this.  Small incision was made in the cystic duct and  intraoperative cholangiogram was then performed using one half-strength  Hypaque dye and a Cook cholangiogram catheter.  Fluoroscopy was used  with free flow of contrast down the cystic duct into the common bile  duct into the duodenum.  Free flow of contrast up the common hepatic  duct into the right and left hepatic ductules was noted.  I saw no signs  of stone or stricture.  The [facility] cholangiogram catheter was removed.  The cystic duct was triple clipped and divided.  Cystic artery was  double clipped and divided.  Cautery was used dissect the gallbladder  from gallbladder fossa.  One grasper tore a small hole with spillage  of  bile but no spillage of stones in gallbladder.  We were able to dissect  remainder of the gallbladder from the gallbladder fossa.  Of note, there  was a small area about 2 cm of decapsulized liver.  There was some  oozing from this and I used cautery to control with excellent result.  Gallbladder was then placed in EndoCatch bag.  Gallbladder bed was  inspected, found be hemostatic.  Surgicel was placed in the gallbladder  bed as well as over the small area of deserosalized liver.  We suctioned  out all excess irrigation until clear.  The bag was then grabbed the  gallbladder in it and extracted through the umbilicus and passed off the  field.  We then flattened the patient out suctioned out any excess  irrigation.  At this point in time the umbilical port site was closed  with a pursestring suture of Vicryl and 4-0 Monocryl was used to  close  skin.  All final count counts of sponge, needle and instruments were  found to be correct this portion of the case.  The patient was then  awoke and taken to recovery after placement of sterile dressings.  All  final counts of sponge, needle and instruments were found to be correct  at this point.      Thomas A. Cornett, M.D.  Electronically Signed     TAC/MEDQ  D:  07/29/2006  T:  07/29/2006  Job:  [id number]   cc:   Stacie Glaze, MD

## 2010-08-08 NOTE — Cardiovascular Report (Signed)
[place]  Patient:    Joshua [name], Joshua [name]                    MRN: [id number] Proc. Date: 11/21/99 Adm. Date:  [id number] Attending:  Colon Branch                        Cardiac Catheterization  DATE OF BIRTH:  03-29-1940.  PRIMARY CARE PHYSICIAN:  Gordy Savers, M.D.  CARDIOLOGIST:  Rollene Rotunda, M.D.  PROCEDURE:  Left heart catheterization/coronary arteriography.  INDICATION:  Evaluate patient with unstable angina and a Cardiolite suggesting an inferior infarct with peri-infarct ischemia.  PROCEDURAL NOTE:  Left heart catheterization was performed via the right femoral artery.  The artery was cannulated using an anterior wall puncture.  A #6-French arterial sheath was inserted via the modified Seldinger technique. Preformed Judkins and a pigtail catheter were utilized.  The patient tolerated the procedure well and left the lab in stable condition.  RESULTS Hemodynamics:  LV 126/17; Ao 124/69.  Coronaries:  The left main had distal 80% stenosis involving the takeoff of the circumflex and an intermediate as well as obtuse marginal vessel.  The LAD had proximal 25% followed by 40% stenoses and mid long 30% stenosis.  The circumflex had an ostial 95-99% stenosis, again involving the intermediate and first obtuse marginal.  The right coronary artery was a very large dominant vessel.  It was proximally calcified with mid 99% stenosis.  Left ventriculogram:  The left ventriculogram was obtained in the RAO and LAO projections.  The EF was approximately 50% with mild anterior hypokinesis and moderate inferior hypokinesis.  CONCLUSION:  Three-vessel coronary artery disease with left main equivalent and right coronary artery high-grade disease.  Given his unstable symptoms and the critical nature of the disease, patient will be sent for urgent coronary artery bypass graft. DD:  11/21/99 TD:  11/21/99 Job:  [id number]

## 2010-08-08 NOTE — Op Note (Signed)
Joshua [name], Joshua [name]             ACCOUNT NO.:  [id number]   MEDICAL RECORD NO.:  [id number]          PATIENT TYPE:  AMB   LOCATION:  SDS                          FACILITY:  [facility]   PHYSICIAN:  Onalee Hua L. Annalee Genta, M.D.DATE OF BIRTH:  [birth year]   DATE OF PROCEDURE:  11/19/2005  DATE OF DISCHARGE:                                 OPERATIVE REPORT   PREOPERATIVE DIAGNOSES:  1. Chronic left sinusitis.  2. Deviated nasal septum.  3. Inferior turbinate hypertrophy.   POSTOPERATIVE DIAGNOSES:  1. Chronic left sinusitis.  2. Deviated nasal septum.  3. Inferior turbinate hypertrophy.   INDICATIONS FOR PROCEDURE:  1. Chronic left sinusitis.  2. Deviated nasal septum.  3. Inferior turbinate hypertrophy.   SURGICAL PROCEDURES:  1. Left endoscopic sinus surgery consisting of total ethmoidectomy and      maxillary antrostomy with removal of diseased sinus tissue.  2. Nasal septoplasty.  3. Bilateral inferior turbinate reduction.   SURGEON:  Dr. Annalee Genta.   COMPLICATIONS:  None.   BLOOD LOSS:  100 mL.   ANESTHESIA:  General.   The patient transferred from the operating room to the recovery room in  stable condition.   BRIEF HISTORY:  Joshua [name] is a 70 year old white male who is referred for  evaluation of chronic sinus complaints with chronic postnasal discharge,  nasal congestion and left-sided periorbital headache.  The patient had been  evaluated and treated by his primary care physician and, despite appropriate  medical therapy, had failed to resolve his infection.  He underwent CT  scanning through Dr. Nira Retort [place]'s office and was found to have left  maxillary sinus and middle meatus opacification with a severely deviated  septum.  He was referred to our office for additional evaluation and  treatment, underwent aggressive medical therapy, including antibiotics,  topical steroids, mucolytics and decongestants and, despite this treatment,  continued to have chronic  findings of sinusitis.  Prior to surgery a CT scan  with Stealth formatting for intraoperative computer-assisted navigation was  performed.  This showed a severely deviated septum with septal spurring,  bilateral turbinate hypertrophy, paradoxical middle turbinate, and  opacification of the ostiomeatal complex, and completed opacification of the  left maxillary sinus.  No evidence of bone erosion or other significant  finding.  Based on the patient's history, examination and CT, he was  scheduled for endoscopic sinus surgery, septoplasty and turbinate reduction.  Surgery was performed at [facility] because of the patient's  underlying medical issues.  The risks, benefits and possible complications  of the patient's surgical procedure were discussed in detail with the  patient and his wife, and they understood and concurred with our plan for  surgery, which was scheduled under general anesthesia at [facility] with  Stealth navigation.   SURGICAL PROCEDURE:  The patient was brought to the operating room at [facility] OR, placed in supine position on the operating table.  General endotracheal anesthesia was established without difficulty, and the  patient was adequately anesthetized.  His nose was injected with a total of  9 mL of 1% lidocaine with 1:100,000  dilution epinephrine which was injected  in a submucosal fashion along the left lateral nasal wall, uncinate process,  middle turbinate, within the intranasal polyps, along the nasal septum and  inferior turbinates.  The patient's nose was then packed bilaterally with  Afrin soaked cottonoid pledgets which were left in place for approximately  10 minutes to allow for vasoconstriction and hemostasis.  The Stealth  navigation headgear was applied, and anatomic and surgical landmarks were  identified and confirmed, and the Stealth device was used throughout the  sinus portion of the case.  The patient was  positioned on the operating  table, prepped and draped in sterile fashion, and surgical procedure was  begun.   Endoscopic sinus surgery was then undertaken.  Using the 0-degree endoscope,  the middle turbinate was gently medialized and the patient's nasal cavity  was inspected.  There was a large polypoid mass emanating from the maxillary  sinus and filling the entire middle meatus.  There was thick mucopurulent  discharge draining from the sinus ostium.  Using a straight microdebrider,  the entire polypoid mass was resected.  Dissection was then carried out  along the uncinate process, which was resected in its entirety, and anterior  to posterior ethmoidectomy was then performed by resecting the ethmoid bulla  and dissecting along the floor of the ethmoid sinus to the posterior aspect  of the ethmoid region.  Using a 45-degree telescope and curved  microdebrider, dissection was then carried out from posterior to anterior  along the roof of the ethmoid sinus.  The [facility] was used to confirm  anatomic landmarks, and dissection was carried out, removing bony  septations, polypoid disease and mucosal thickening.  The nasal frontal  recess was identified.  There was an anterior ethmoid air cell obstructing  this region.  This was resected, creating a widely patent nasal frontal  recess.  Attention was then turned to the lateral nasal wall where the  natural ostium of maxillary sinus was gently palpated.  Purulent material  was cultured from this area and sent to microbiology for analysis.  Polypoid  disease then resected and a curved microdebrider was used to resect a large  intranasal polyp which was attached to the posterior-superior aspect of the  maxillary sinus.  The entire polyp was resected and the sinus was cleared of  disease.  The maxillary sinus was then flushed thoroughly with approximately 60 mL of sterile saline.  The attachment of the polyp was bleeding slightly  and this  was cauterized with monopolar suction cautery.   Attention was then turned to the patient's nasal septum.  A left anterior  hemitransfixion incision was created.  Mucoperichondrial flap was elevated  from anterior to posterior along the patient's left-hand side.  Bony  cartilaginous junction was crossed in the midline and mucoperiosteal flap  was elevated along the patient's right.  Deviated bone and cartilage in the  mid and posterior aspects of the nasal septum were then resected, including  a very large left septal spur.  The anterior dorsal and columellar cartilage  was intact and not deviated, and was left without implementation.  Mid  septal cartilage was morselized and returned to the mucoperichondrial  pocket, and the mucoperichondrial flaps reapproximated with a 4-0 gut suture  on a Keith needle in horizontal mattressing fashion.  At the conclusion of  the surgical procedure, bilateral Doyle nasal septal splints were placed,  after the application of Bactroban ointment, and sutured position with a 3-0  Ethilon  suture.   Attention was then turned to the inferior turbinates, where bilateral  inferior turbinate intramural cautery was performed with the bipolar cautery  set at 12 watts.  Two submucosal passes were made in each inferior  turbinate.  When the turbinates had been adequately cauterized or  outfractured, a small incision was made in the anterior aspect of each  inferior turbinate, and turbinate bone was resected, preserving the  overlying mucosa.   The patient's nasal cavity, nasopharynx, oral cavity and oropharynx were  then irrigated and suctioned.  Surgical debris was resected.  A Kennedy  sinus pack was placed in the left common ethmoid cavity, after the  application of Bactroban ointment, and hydrated with saline solution.  An  orogastric tube was passed and stomach contents were aspirated.  The patient  was then awakened from his anesthetic, extubated, and  transferred from the  operating room to the recovery room in stable condition.  No complications.  Blood loss approximately 100 mL.           ______________________________  Kinnie Scales. Annalee Genta, M.D.     DLS/MEDQ  D:  40/98/1191  T:  11/19/2005  Job:  [id number]

## 2010-08-08 NOTE — Discharge Summary (Signed)
[place]  Patient:    Joshua [name], Joshua [name]                    MRN: [id number] Adm. Date:  [id number] Disc. Date: [id number] Attending:  Charlett Lango Dictator:   Areta Haber, P.A.-C. CC:         Rollene Rotunda, M.D. [facility]             Gordy Savers, M.D.                           Discharge Summary  HISTORY OF PRESENT ILLNESS:  This is a 70 year old male who has had a two to three-month history of exertional chest pain.  He was admitted this hospitalization with chest pain and an abnormal Cardiolite.  The patients symptoms have been noted to have increased in the previous few days prior to admission and was also associated with left arm pain.  On the day of admission, adenosine Cardiolite showed abnormalities with inferolateral scarring and a small area of peri-infarction ischemia.  Ejection fraction was 42%.  The patient was felt to require admission for further evaluation and treatment including cardiac catheterization.  PAST MEDICAL HISTORY:  Hyperlipidemia, asthma, gastroesophageal reflux disease, history of tobacco abuse, history of thyroid surgery.  MEDICATIONS ON ADMISSION: 1. Lipitor 10 mg q.d. 2. Zoloft 50 mg q.d. 3. Zyban 150 mg q.d. 4. Advair 1 puff twice daily. 5. Prilosec 20 mg q.d.  ALLERGIES:  "MYCINS" and SULFA.  FAMILY HISTORY, SOCIAL HISTORY, REVIEW OF SYSTEMS, and PHYSICAL EXAMINATION: Please see History & Physical at time of admission.  HOSPITAL COURSE:  The patient was admitted, scheduled for cardiac catheterization, started on aspirin and Lopressor in addition to his current medicines as well as intravenous heparin drip.  On November 21, 1999, he was taken to the cardiac catheterization lab by Dr. Antoine Poche where he was found to have severe three-vessel coronary artery disease as well as a distal 80% left main lesion.  Due to these findings which included 80% left main lesion, a proximal 25 to 40% LAD lesion,  an ostial 95% circumflex lesion, and a proximal mid right coronary artery lesion of 99% and the fact that he ruled in for non-Q-wave myocardial infarction by enzymes, an urgent cardiac surgical consultation was obtained with Viviann Spare C. Dorris Fetch, M.D. who evaluated the patient and his studies and agreed with recommendations for proceeding with urgent revascularization.  Procedure: After the risks and benefits of the proposed procedure were explained and the proper consents were obtained the patient underwent the followingg procedure: Coronary artery bypass graft x 5.  The followin grafts were placed: 1) Left internal mammary artery to the LAD; 2) sequential saphenous vein graft to the ramus intermedius #1 and #2; 3) saphenous vein graft to the obtuse marginal; and 4) saphenous vein graft to the distal right coronary artery.  Intraoperative findings included good arterial targets and good venous and internal mammary artery conduits.  Crossclamp time was 67 minutes.  pump time was 137 minutes.  The patient tolerated the procedure quite well and was taken to the surgical intensive care unit in stable condition.  During postoperative hospital course, the patient was quite stable on his initial assessment in the intensive care unit with cardiac index of 3.0 on 6 cc of dopamine.  Hematocrit was 24%.  Potassium chloride was 3.5 and was supplemented.  The patient did have a laboratory coagulopathy with no  clinically significant bleeding but was noted to possibly need a transfusion. The patient was started on a course of weaning to extubate.  On postoperatively day #1, the patient was extubated, but he was noted to be still be drowsy.  He continued to have good cardiac output with no significant EKG findings.  The patients blood pressure was a little low, and albumin was used for volume, and he was started on a course gentle diuresis stimulated by Lasix.  The patients Swan-Ganz catheter and  chest tube were discontinued.  On postoperative day #1, the patient was felt to be a candidate for possible transfer to the 2000 telemetry unit, but this was held as the patient had a borderline low blood pressure, although this did improve.  On postoperative day #2, the patient was transferred to 2000 telemetry unit. He was also transfused for a hematocrit of 21%.  The patient was encouraged to continue pulmonary toilet at that time.  Over the next few days, the key issues were primarily pain control and mobilization, but the patient did have a good and gradual improvement with these.  Laboratory values were felt to be stable on postoperative day #4 with hemoglobin and hematocrit 8.5 and 23.6, respectively.  Creatinine was also noted to be 1.5 which was a trend towards baseline.  There was a slight bump in creatinine at 1.8 during postoperative period.  Currently the patient is feeling well.  He is afebrile.  Telemetry shows no significant dysrhythmias or ectopy.  Hemodynamically, he is stable.  Oxygen has been weaned, and he maintains good saturations on room air.  Wounds are clean and dry and healing well.  On physical examination, he is felt to be quite stable.  Tentatively, it is felt that he will be discharged on the morning of November 27, 1999, pending morning round reevaluation.  DISCHARGE MEDICATIONS: 1. Iron sulfate 325 mg twice daily. 2. Folic acid 1 mg q.d. 3. Zyban 150 mg q.d. 4. Lopressor 25 mg q.12h. 5. Zoloft 50 mg q.h.s. 6. Advair 50/100 one puff b.i.d. 7. Enteric-coated aspirin 325 mg q.d. 8. Prilosec 20 mg q.d. 9. Tylox 1 to 2 q.4-6h. p.r.n.  FINAL DIAGNOSES: 1. Coronary artery disease as delineated above. 2. History of asthma. 3. History of tobacco abuse. 4. Hyperlipidemia. 5. Postoperative anemia. 6. History of thyroid surgery. 7. Gastroesophageal reflux disease.  FOLLOWUP:  Will include Dr. Dorris Fetch on September 26 at 11:15 a.m., Dr. Antoine Poche on  September 18 at 10 a.m.  DISCHARGE INSTRUCTIONS:  The patient will receive written instruction in  regard to medications, activity, diet, wound care, and followup.DD:  11/26/99 TD:  11/27/99 Job: [id number]

## 2010-08-08 NOTE — Op Note (Signed)
[facility]  Patient:    Joshua [name], Joshua [name]                    MRN: [id number] Proc. Date: 11/21/99 Adm. Date:  [id number] Attending:  Charlett Lango CC:         Rollene Rotunda, M.D. [facility]             Gordy Savers, M.D.                           Operative Report  PREOPERATIVE DIAGNOSIS:  Unstable angina, status post non-Q-wave myocardial infarction, left main coronary disease.  POSTOPERATIVE DIAGNOSIS:  Unstable angina, status post non-Q-wave myocardial infarction, left main coronary disease.  OPERATION PERFORMED:  Median sternotomy, extracorporeal circulation, coronary artery bypass grafting x 5 (left internal mammary artery to left anterior descending, sequential saphenous vein graft to ramus intermedius 1 and 2, saphenous vein graft to obtuse marginal, saphenous vein graft to distal right coronary artery).  SURGEON:  Salvatore Decent. Dorris Fetch, M.D.  ASSISTANT:  Maxwell Marion, CRN, FA  ANESTHESIA:  General.  OPERATIVE FINDINGS:  Good quality coronaries, circumflex deep in A-V groove. Would not attempt to redo grafting of that vessel.  Mammary and saphenous veins were of good quality.  Overall preserved ventricular function.  INDICATIONS FOR PROCEDURE:  Joshua [name] is a 70 year old gentleman who has had a two to three month history of exertional chest pain.  He had had a positive exercise treadmill test and a positive cardiolite for inferior ischemia.  The patient had developed unstable angina and ruled in for a non-Q-wave myocardial infarction.  He underwent cardiac catheterization on November 21, 1999.  I was called to see the patient in the cardiac catheterization lab when the catheterization revealed a 99% mid-RCA stenosis as well as a tight left main stenosis at the distal left main involving the take-off of all branch vessels. The patient was advised to undergo urgent coronary artery bypass grafting.  The indications, risks,  benefits and alternatives were discussed with the patient and his wife.  They understood and agreed to proceed and the operating room was readied as soon as possible.  Joshua [name] was brought to the cath lab holding area to the operating room on November 21, 1999.  Lines were placed to monitor arterial, central venous and pulmonary arterial pressure.  EKG leads were placed for continuous telemetry. The patient was then anesthetized and intubated.  A Foley catheter was placed. Intravenous antibiotics were administered.  The chest, abdomen and legs were prepped and draped in the usual fashion.  A median sternotomy was performed.  Simultaneously an incision was made in the medial aspect of the left leg and greater saphenous vein was harvested from the ankle to the midthigh.  Of note, the patient was on Integralin at the time of arrival to the operating room and it was elected not to do bilateral mammaries because of increased risk of bleeding.  The left internal mammary artery was harvested in standard fashion.  It was a good quality conduit. There was excellent flow through the cut end of the graft.  The mammary was placed in a papaverine soaked sponge and placed into the left pleural space.  The aorta was then cannulated after opening the pericardium.  2-0 Ethibond nonpledgeted pursestring sutures were placed.  The cannula was placed through the sutures without difficulty.  There was no palpable atherosclerotic disease in the ascending aorta.  A dual stage venous cannula was then placed via a pursestring suture in the right atrial appendage.  Cardiopulmonary bypass was instituted and the patient was cooled to 32 degrees Celsius.  The coronary arteries were inspected and anastomotic sites were chosen.  The conduits were inspected and cut to length.  The foam pad was placed in the pericardium to protect the left phrenic nerve.  A temperature probe was placed in the myocardial septum and a  cardioplegia cannula was placed in the ascending aorta.  The aorta was crossclamped.  The left ventricle was emptied via aortic root vent.  Cardiac arrest then was achieved with a combination of cold antegrade cardioplegia and topical iced saline.  After achieving a complete diastolic arrest and the myocardial septal temperature was less than 10 degrees Celsius the following distal anastomoses were performed.  First a reversed saphenous vein graft was placed end-to-side to the distal right coronary artery.  This was a 2 mm good quality target.  The vein graft was of good quality.  The anastomosis was performed with a running 7-0 Prolene suture.  There was excellent flow through the graft and good hemostasis when cardioplegia was administered.  Next, a reversed saphenous vein graft was placed end-to-side to the left circumflex coronary artery in the AV groove. This vessel was 1.3 mm in diameter.  It gave rise to two very small marginal branches which were not graftable separately as they were less than 1 mm in diameter.  The circumflex was of good quality as was the saphenous vein.  End-to-side anastomosis was performed.  A 1 mm probe was passed proximally and distally to ensure patency before tying the suture.  There was good flow through the graft with flushing and additional cardioplegia was administered and there was good hemostasis at the anastomosis.  Next, a reversed saphenous vein graft was placed sequentially to the first and second ramus intermedius branches.  These branches arose from the bifurcation of the left main and ran roughly parallel to each other on the anterolateral wall.  Both were 1.5 mm in diameter good quality vessels.  The second branch was intramyocardial.  A side-to-side anastomosis was performed to the first branch using a running 7-0 Prolene suture off a side branch of the vein graft. The vein graft was of good quality.  An end-to-side anastomosis then  was constructed to the second branch.  Both anastomoses were performed using  running 7-0 Prolene sutures.  Both were probed proximally and distally with 1.5 mm probes at the completion of the anastomosis to ensure patency.  There was then excellent flow through the vein graft and additional cardioplegia was administered down all vein grafts.  Next, a window was made in the pericardium.  This was several centimeters anterior to the left phrenic nerve.  The left internal mammary artery was brought through this and cut to length.  The distal end of the mammary artery was spatulated.  Again, there was excellent flow through the vessel.  The mammary, then was anastomosed end-to-side to the distal LAD using a running 8-0 Prolene suture.  The distal LAD was a 1.5 mm good quality vessel.  At the completion of the mammary to LAD anastomosis, the bulldog clamp was removed from the mammary artery.  Immediate and rapid septal rewarming was noted. Lidocaine was administered.  The mammary pedicle was tacked to the epicardial surface of the heart with 6-0 Prolene sutures.  The aortic crossclamp was removed.  Total crossclamp time was 67 minutes.  The patient did have ventricular fibrillation after removal of the crossclamps and a single defibrillation with 20 joules was required.  The patient then had sinus rhythm throughout the remainder.  A partial occlusion clamp was placed on the ascending aorta.  The vein grafts were cut to length.  The cardioplegia cannula was removed.  The proximal vein graft anastomoses were performed to 4.4 mm punch aortotomies with running 6-0 Prolene sutures.  At the completion of the final proximal anastomosis the patient was placed in Trendelenburg position.  Air was allowed to vent as the partial clamp was removed.  The suture was then tied.  Air was aspirated form each of the vein grafts.  The bulldog clamps were removed.  All proximal and distal anastomoses were  inspected for hemostasis.  Epicardial pacing wires were placed on the right ventricle and right atrium.  The patient was being rewarmed during the proximal anastomoses and was weaned from cardiopulmonary bypass when the core temperature reached 37 degrees Celsius.  Total bypass time was 137 minutes.  The patient was on no inotropic support but was atrially paced for rate at the time of separation from bypass.  The initial cardiac index was greater than 2L per minute per meter squared.  A test dose of protamine was administered and was well tolerated.  The atrial and aortic cannulae were removed.  The remainder of the protamine was administered without incident.  There was good hemostasis at both cannulation sites.  The chest was irrigated with 1L warm normal saline containing 1 gm of vancomycin.  Hemostasis was achieved.  There was good hemostasis considering the patient had been on Integrelin while in the catheterization laboratory.  The pericardium was reapproximated over the aorta and base of the heart with interrupted 3-0 silk sutures but did not come completely together but was brought together as much as possible without tension and without kinking the grafts.  A left pleural and two mediastinal chest tubes were placed through separate subcostal incisions.  The sternum was closed with heavy gauge interrupted stainless steel wires.  The pectoralis fascia was closed with a running #1 Vicryl suture and both the chest and the leg subcutaneous tissue was closed with running 2-0 Vicryl suture and the skin was closed with a running 3-0 Vicryl suture.  At the time of closure of the chest, the patient did experience a brief drop in his cardiac index which responded to fluid administration.  His PAD at the time was 10 prior to the fluid administration. The patient was taken from the operating room to the surgical intensive care unit intubated and in stable condition. DD:  11/21/99 TD:   11/24/99 Job: [id number]

## 2010-08-08 NOTE — Assessment & Plan Note (Signed)
[facility]                              CARDIOLOGY OFFICE NOTE   NAME:Joshua [name], Rad A                    MRN:          [id number]  DATE:02/10/2006                            DOB:          1940/07/06    PRIMARY:  Dr. Darryll Capers   REASON FOR PRESENTATION:  Evaluate the patient for coronary artery disease.   HISTORY OF PRESENT ILLNESS:  The patient returns for followup.  He has had  no cardiac problems since I last saw him.  He had an episode of  hypertension, which he relates to sinus surgery and possibly anesthesia  since then.  This is improved.  Now, he is active.  He denies any chest  pressure, neck discomfort, arm discomfort (his previous angina).  He has had  no shortness of breath.  Denies any PND or orthopnea.  He has had no  palpitations, pre-syncope, or syncope.  Of note, he did have a stress  perfusion study last year, which was negative for any evidence of ischemia  or infarct.   PAST MEDICAL HISTORY:  Coronary artery disease status post CABG (LIMA to the  LAD, sequential SVG to intermediate 1 and 2, SVG to obtuse marginal, SVG to  the right coronary artery), postoperative anemia, mild renal insufficiency  postoperatively, anxiety, bronchospasm, hyperlipidemia.   ALLERGIES:  Sulfa.   MEDICATIONS:  1. Benicar/hydrochlorothiazide 20/12.5 daily.  2. Folic acid.  3. Claritin.  4. Aspirin.  5. Crestor 10 mg a day.  6. Pulmicort.  7. Lunesta.   REVIEW OF SYSTEMS:  As stated in the HPI, and otherwise negative for other  systems.   PHYSICAL EXAMINATION:  The patient is in no distress.  Blood pressure 120/72, heart rate 84 and regular, weight 198 pounds, body  mass index 31.  NECK:  No jugular venous distension at 45 degrees, carotid upstroke brisk  and symmetric, no bruits, thyromegaly.  LYMPHATICS:  No adenopathy.  LUNGS:  Clear to auscultation bilaterally.  BACK:  No costovertebral angle tenderness.  CHEST:  Well-healed  sternotomy scar.  HEART:  PMI not displaced or sustained, S1 and S2 within normal limits, no  S3, no S4, no murmurs.  ABDOMEN:  Flat, positive bowel sounds, normal in frequency and pitch, no  bruits, rebound, guarding.  No midline pulsatile mass.  No hepatomegaly,  splenomegaly.  SKIN:  No rashes, no nodules.  EXTREMITIES:  With 2+ pulses, no edema, cyanosis, clubbing.  NEURO:  Oriented to person, place, and time, cranial nerves 2-12 grossly  intact, motor grossly intact.   EKG:  Sinus rhythm, rate 84, axis within normal limits, intervals within  normal limits.  No acute ST-T wave changes.   ASSESSMENT AND PLAN:  1. Coronary artery disease.  The patient is doing well with respect to      this.  No further cardiovascular testing is suggested.  He will      continue with risk reduction.  2. Dyslipidemia.  He is having this followed closely by Dr. Lovell Sheehan.  He      did not tolerate Lipitor.  He is now on Crestor and  reports that his      HDL is elevated and his LDL is down.  I will defer to Dr. Lovell Sheehan.  3. Followup.  I will see him back in about 2 years or sooner if need.     Rollene Rotunda, MD, [facility]  Electronically Signed    JH/MedQ  DD: 02/10/2006  DT: 02/10/2006  Job #: [id number]   cc:   Stacie Glaze, MD

## 2010-08-08 NOTE — Assessment & Plan Note (Signed)
[facility]                                   ON-CALL NOTE   Joshua [name], Joshua [name]                      MRN:          [id number]  DATE:11/25/2005                            DOB:          02-May-1940    Phone number [phone].   The patient had some type of sinus surgery by Dr. Annalee Genta about 4-5 days  ago and is having problems since then.  A day or so ago, he complained of  shortness of breath, but it may have been because he has nasal obstruction.  Apparently, they called the office or perhaps Dr. Lovell Sheehan ordered a chest x-  ray as an outpatient which was normal.  Since that time, he has seemed  somewhat agitated, but breathing, I suppose, is okay.  His bleeding just  stopped.  He has an appointment with Dr. Annalee Genta tomorrow, but blood  pressure was checked today and it was in the 170/110 range.  He is under  treatment for hypertension with Benicar and is a coronary artery patient  with a history of bypass.  He denies any specific chest pain, but does have  symptoms of some pain under his left axilla with no other associated  symptoms.  He has restless leg syndrome and takes p.r.n. Mirapex.  He has  been given Ambien or Valium type medicines in the past p.r.n. sleep when  there is some difficulty with that.  Discussed this with wife.  Have him  relax, perhaps take a half of his 5 mg of Valium and retake his blood  pressure.  If not improving, can call back or consider taking another half  of his Benicar.  If he has anything that his heart related symptoms such as  chest pain or shortness of breath as before, they should go to the emergency  room, otherwise he should be seen by Dr. Lovell Sheehan or colleague tomorrow or  this week.  He will follow up with ENT tomorrow and the question of when he  can take aspirin will be given to them.                                   Neta Mends. Fabian Sharp, MD   WKP/MedQ  DD:  11/25/2005  DT:  11/26/2005  Job #:   [id number]

## 2010-08-08 NOTE — Assessment & Plan Note (Signed)
[facility]                                 ON-CALL NOTE   WIRT, HEMMERICH                      MRN:          [id number]  DATE:04/27/2006                            DOB:          08-08-1940    PRIMARY CARE PHYSICIAN:  Dr. Lovell Sheehan.   Mr. Sauceda wife called in today stating that he has been complaining  of dizziness for the last several hours and has fallen twice. He does  have a history of hypertension.   PLAN:  Given the above symptoms, I advised her to take Mr. Calix  immediately to the emergency department. Wife will call EMS immediately.     Leanne Chang, M.D.  Electronically Signed    LA/MedQ  DD: 04/27/2006  DT: 04/27/2006  Job #: [id number]

## 2010-08-19 ENCOUNTER — Telehealth: Payer: Self-pay | Admitting: *Deleted

## 2010-08-19 NOTE — Telephone Encounter (Signed)
Discussed Dr. York Ram recommendations with pt. And he will use heat, cut back on his workout and lifting for a few days, call with any concerns, and go to the ER with any symptoms or MI.

## 2010-08-19 NOTE — Telephone Encounter (Signed)
Left Breast Pain .......does not think it is his heart, but he had a very strenuous work out today, and then developed this pain.  Wants to see Dr. Lovell Sheehan.

## 2010-08-19 NOTE — Telephone Encounter (Signed)
Per dr Lovell Sheehan, it is probably his pec's hurting him - so use heat and if not better in a few days call back but if he starts having heart sx go to er

## 2010-09-06 ENCOUNTER — Other Ambulatory Visit: Payer: Self-pay | Admitting: Internal Medicine

## 2010-09-08 ENCOUNTER — Other Ambulatory Visit: Payer: Self-pay | Admitting: *Deleted

## 2010-09-08 MED ORDER — OMEPRAZOLE 20 MG PO CPDR: 20.0000 mg | DELAYED_RELEASE_CAPSULE | Freq: Two times a day (BID) | ORAL | Status: DC

## 2010-09-11 ENCOUNTER — Ambulatory Visit (INDEPENDENT_AMBULATORY_CARE_PROVIDER_SITE_OTHER): Payer: Medicare Other | Admitting: Internal Medicine

## 2010-09-11 ENCOUNTER — Encounter: Payer: Self-pay | Admitting: Internal Medicine

## 2010-09-11 DIAGNOSIS — E785 Hyperlipidemia, unspecified: Secondary | ICD-10-CM

## 2010-09-11 DIAGNOSIS — J329 Chronic sinusitis, unspecified: Secondary | ICD-10-CM

## 2010-09-11 DIAGNOSIS — I2510 Atherosclerotic heart disease of native coronary artery without angina pectoris: Secondary | ICD-10-CM

## 2010-09-11 DIAGNOSIS — I10 Essential (primary) hypertension: Secondary | ICD-10-CM

## 2010-09-11 MED ORDER — AMOXICILLIN-POT CLAVULANATE 875-125 MG PO TABS: 1.0000 | ORAL_TABLET | Freq: Two times a day (BID) | ORAL | Status: AC

## 2010-09-11 NOTE — Patient Instructions (Signed)
Get plenty of rest, Drink lots of  clear liquids, and use Tylenol or ibuprofen for fever and discomfort.    Take your antibiotic as prescribed until ALL of it is gone, but stop if you develop a rash, swelling, or any side effects of the medication.  Contact our office as soon as possible if  there are side effects of the medication.  Call or return to clinic prn if these symptoms worsen or fail to improve as anticipated.   

## 2010-09-11 NOTE — Progress Notes (Signed)
  Subjective:    Patient ID: Joshua [name], male    DOB: February 15, 1941, 70 y.o.   MRN: [id number]  HPI 70 year old patient who has a history of chronic sinus disease. He has allergic rhinitis and has been on maintenance Flonase and Clarinex. The past week he has had increasing sinus congestion and drainage and increasing pain involving the left maxillary sinus area. He has felt feverish and done well. There is increasing drainage and postnasal drip. He has coronary artery disease status post CABG which has been stable. He has multiple antibiotic allergies but apparently can tolerate penicillin      Review of Systems  Constitutional: Positive for fatigue. Negative for fever, chills and appetite change.  HENT: Positive for congestion and postnasal drip. Negative for hearing loss, ear pain, sore throat, trouble swallowing, neck stiffness, dental problem, voice change and tinnitus.   Eyes: Negative for pain, discharge and visual disturbance.  Respiratory: Negative for cough, chest tightness, wheezing and stridor.   Cardiovascular: Negative for chest pain, palpitations and leg swelling.  Gastrointestinal: Negative for nausea, vomiting, abdominal pain, diarrhea, constipation, blood in stool and abdominal distention.  Genitourinary: Negative for urgency, hematuria, flank pain, discharge, difficulty urinating and genital sores.  Musculoskeletal: Negative for myalgias, back pain, joint swelling, arthralgias and gait problem.  Skin: Negative for rash.  Neurological: Positive for headaches. Negative for dizziness, syncope, speech difficulty, weakness and numbness.  Hematological: Negative for adenopathy. Does not bruise/bleed easily.  Psychiatric/Behavioral: Negative for behavioral problems and dysphoric mood. The patient is not nervous/anxious.        Objective:   Physical Exam  Constitutional: He is oriented to person, place, and time. He appears well-developed.  HENT:  Head: Normocephalic.    Right Ear: External ear normal.  Left Ear: External ear normal.       Left maxillary sinus tenderness  Eyes: Conjunctivae and EOM are normal.  Neck: Normal range of motion.  Cardiovascular: Normal rate and normal heart sounds.   Pulmonary/Chest: Breath sounds normal.  Abdominal: Bowel sounds are normal.  Musculoskeletal: Normal range of motion. He exhibits no edema and no tenderness.  Neurological: He is alert and oriented to person, place, and time.  Psychiatric: He has a normal mood and affect. His behavior is normal.          Assessment & Plan:  Suspected low grade recurrent sinusitis. Will add nasal irrigation to his regimen as well as Augmentin. Coronary artery disease stable Hypertension well controlled

## 2010-09-19 ENCOUNTER — Encounter: Payer: Self-pay | Admitting: Internal Medicine

## 2010-09-19 ENCOUNTER — Ambulatory Visit (INDEPENDENT_AMBULATORY_CARE_PROVIDER_SITE_OTHER): Payer: Medicare Other | Admitting: Internal Medicine

## 2010-09-19 DIAGNOSIS — I10 Essential (primary) hypertension: Secondary | ICD-10-CM

## 2010-09-19 DIAGNOSIS — J329 Chronic sinusitis, unspecified: Secondary | ICD-10-CM

## 2010-09-19 NOTE — Progress Notes (Signed)
  Subjective:    Patient ID: Joshua [name], male    DOB: 1940/10/09, 70 y.o.   MRN: [id number]  HPI  70 year old patient who was seen 8 days ago and treated for subacute sinusitis. Is completing a course of Augmentin therapy. He has a history of COPD and asthma and is on inhalational medications. He has had some increasing chest congestion for the past week and until yesterday was improved he was quite active yesterday but this morning at 9:00 developed more chest congestion and slight shortness of breath. There's been no fever wheezing or productive cough. No chest pain    Review of Systems  Constitutional: Negative for fever, chills, appetite change and fatigue.  HENT: Positive for congestion. Negative for hearing loss, ear pain, sore throat, trouble swallowing, neck stiffness, dental problem, voice change and tinnitus.   Eyes: Negative for pain, discharge and visual disturbance.  Respiratory: Negative for cough, chest tightness, wheezing and stridor.   Cardiovascular: Negative for chest pain, palpitations and leg swelling.  Gastrointestinal: Negative for nausea, vomiting, abdominal pain, diarrhea, constipation, blood in stool and abdominal distention.  Genitourinary: Negative for urgency, hematuria, flank pain, discharge, difficulty urinating and genital sores.  Musculoskeletal: Negative for myalgias, back pain, joint swelling, arthralgias and gait problem.  Skin: Negative for rash.  Neurological: Negative for dizziness, syncope, speech difficulty, weakness, numbness and headaches.  Hematological: Negative for adenopathy. Does not bruise/bleed easily.  Psychiatric/Behavioral: Negative for behavioral problems and dysphoric mood. The patient is not nervous/anxious.        Objective:   Physical Exam  Constitutional: He is oriented to person, place, and time. He appears well-developed.  HENT:  Head: Normocephalic.  Right Ear: External ear normal.  Left Ear: External ear normal.    Eyes: Conjunctivae and EOM are normal.  Neck: Normal range of motion.  Cardiovascular: Normal rate and normal heart sounds.   Pulmonary/Chest: Effort normal and breath sounds normal.       Rare rhonchi  O2 saturation 98%  Abdominal: Bowel sounds are normal.  Musculoskeletal: Normal range of motion. He exhibits no edema and no tenderness.  Neurological: He is alert and oriented to person, place, and time.  Psychiatric: He has a normal mood and affect. His behavior is normal.          Assessment & Plan:   URI in the setting of asthma. Will continue on inhalational medications. Mucinex or Robitussin recommended. Samples were provided but no new medications. He'll call if he fails to improve

## 2010-09-19 NOTE — Patient Instructions (Signed)
Get plenty of rest, Drink lots of  clear liquids, and use Tylenol or ibuprofen for fever and discomfort.    Call or return to clinic prn if these symptoms worsen or fail to improve as anticipated.  

## 2010-10-24 ENCOUNTER — Ambulatory Visit (INDEPENDENT_AMBULATORY_CARE_PROVIDER_SITE_OTHER): Payer: Medicare Other | Admitting: Internal Medicine

## 2010-10-24 ENCOUNTER — Encounter: Payer: Self-pay | Admitting: Internal Medicine

## 2010-10-24 DIAGNOSIS — I10 Essential (primary) hypertension: Secondary | ICD-10-CM

## 2010-10-24 DIAGNOSIS — M549 Dorsalgia, unspecified: Secondary | ICD-10-CM

## 2010-10-24 DIAGNOSIS — R413 Other amnesia: Secondary | ICD-10-CM

## 2010-10-24 DIAGNOSIS — D509 Iron deficiency anemia, unspecified: Secondary | ICD-10-CM

## 2010-10-24 DIAGNOSIS — K589 Irritable bowel syndrome without diarrhea: Secondary | ICD-10-CM

## 2010-10-24 NOTE — Progress Notes (Signed)
  Subjective:    Patient ID: Joshua [name], male    DOB: 03/12/1941, 70 y.o.   MRN: [id number]  HPI    Review of Systems     Objective:   Physical Exam        Assessment & Plan:

## 2010-10-24 NOTE — Progress Notes (Signed)
  Subjective:    Patient ID: Colon Flattery, male    DOB: Dec 05, 1940, 70 y.o.   MRN: [id number]  HPI Still has pain but has resumed exercize Has radical prostate surgery Has some incontinence and the surgeon has proposed bladder support He cannot use the bike  But can use the eliptical exercizes twice a day for spinctor    Review of Systems  Constitutional: Negative for fever and fatigue.  HENT: Negative for hearing loss, congestion, neck pain and postnasal drip.   Eyes: Negative for discharge, redness and visual disturbance.  Respiratory: Negative for cough, shortness of breath and wheezing.   Cardiovascular: Negative for leg swelling.  Gastrointestinal: Negative for abdominal pain, constipation and abdominal distention.  Genitourinary: Negative for urgency and frequency.  Musculoskeletal: Negative for joint swelling and arthralgias.  Skin: Negative for color change and rash.  Neurological: Negative for weakness and light-headedness.  Hematological: Negative for adenopathy.  Psychiatric/Behavioral: Negative for behavioral problems.   Past Medical History  Diagnosis Date  . Asthma   . CAD (coronary artery disease)   . Depression   . GERD (gastroesophageal reflux disease)   . Hyperlipidemia   . IBS (irritable bowel syndrome)   . Acute medial meniscal tear   . DJD (degenerative joint disease)   . Hypertension   . Anemia   . Diverticulosis   . Obesity   . Pancreatitis   . PUD (peptic ulcer disease)   . Pneumonia   . Sleep apnea   . Prostate cancer    Past Surgical History  Procedure Date  . Thyroidectomy, partial   . Coronary artery bypass graft   . Cholecystectomy   . Cervical fusion   . Cervical laminectomy 05/26/06  . Arthroscopy rt knee 04/2008    daldoerf    reports that he quit smoking about 18 years ago. He does not have any smokeless tobacco history on file. He reports that he does not drink alcohol or use illicit drugs. family history includes Heart  disease in his father. Allergies  Allergen Reactions  . Bromfed     REACTION: n\T\v  . Clarithromycin     REACTION: gastritis  . Codeine   . Doxycycline Hyclate     REACTION: n\T\v  . Erythromycin Base     REACTION: rash  . Hydrocodone-Acetaminophen     REACTION: rash  . Modafinil     REACTION: anxiety-nervousness  . Promethazine Hcl     REACTION: fainting  . Sulfonamide Derivatives     REACTION: rash  . Telithromycin     REACTION: nausea       Objective:   Physical Exam  Constitutional: He appears well-developed and well-nourished.  HENT:  Head: Normocephalic and atraumatic.  Eyes: Conjunctivae are normal. Pupils are equal, round, and reactive to light.  Neck: Normal range of motion. Neck supple.  Cardiovascular: Normal rate and regular rhythm.   Pulmonary/Chest: Effort normal and breath sounds normal.  Abdominal: Soft. Bowel sounds are normal.          Assessment & Plan:  Take time to heal and avoid the bike for now No chest pain feels well Pubic pain is the main issue

## 2010-11-07 ENCOUNTER — Ambulatory Visit: Payer: Medicare Other | Admitting: Internal Medicine

## 2010-12-19 ENCOUNTER — Other Ambulatory Visit: Payer: Self-pay | Admitting: Internal Medicine

## 2011-01-05 ENCOUNTER — Encounter: Payer: Self-pay | Admitting: Internal Medicine

## 2011-01-05 ENCOUNTER — Ambulatory Visit (INDEPENDENT_AMBULATORY_CARE_PROVIDER_SITE_OTHER): Payer: Medicare Other | Admitting: Internal Medicine

## 2011-01-05 VITALS — BP 140/80 | HR 72 | Temp 98.3°F | Resp 16 | Ht 66.5 in | Wt 188.0 lb

## 2011-01-05 DIAGNOSIS — K5289 Other specified noninfective gastroenteritis and colitis: Secondary | ICD-10-CM

## 2011-01-05 DIAGNOSIS — I10 Essential (primary) hypertension: Secondary | ICD-10-CM

## 2011-01-05 DIAGNOSIS — D509 Iron deficiency anemia, unspecified: Secondary | ICD-10-CM

## 2011-01-05 DIAGNOSIS — J45909 Unspecified asthma, uncomplicated: Secondary | ICD-10-CM

## 2011-01-05 DIAGNOSIS — I2510 Atherosclerotic heart disease of native coronary artery without angina pectoris: Secondary | ICD-10-CM

## 2011-01-05 DIAGNOSIS — J329 Chronic sinusitis, unspecified: Secondary | ICD-10-CM

## 2011-01-05 NOTE — Patient Instructions (Signed)
Patient was instructed to continue all medications as prescribed. To stop at the checkout desk and schedule a followup appointment Send a copy of PSA from Dr Wilson Singer Need to drink water to keep hydrated and keep the creatinine low

## 2011-01-08 ENCOUNTER — Encounter: Payer: Self-pay | Admitting: Internal Medicine

## 2011-02-23 ENCOUNTER — Other Ambulatory Visit: Payer: Self-pay | Admitting: *Deleted

## 2011-02-23 MED ORDER — OLMESARTAN MEDOXOMIL-HCTZ 20-12.5 MG PO TABS: 1.0000 | ORAL_TABLET | Freq: Every day | ORAL | Status: DC

## 2011-02-24 ENCOUNTER — Other Ambulatory Visit: Payer: Self-pay | Admitting: *Deleted

## 2011-02-24 MED ORDER — DIAZEPAM 5 MG PO TABS: 5.0000 mg | ORAL_TABLET | Freq: Every evening | ORAL | Status: DC | PRN

## 2011-03-13 ENCOUNTER — Ambulatory Visit (INDEPENDENT_AMBULATORY_CARE_PROVIDER_SITE_OTHER): Payer: Medicare Other | Admitting: Internal Medicine

## 2011-03-13 ENCOUNTER — Encounter: Payer: Self-pay | Admitting: Internal Medicine

## 2011-03-13 DIAGNOSIS — R5383 Other fatigue: Secondary | ICD-10-CM

## 2011-03-13 DIAGNOSIS — I10 Essential (primary) hypertension: Secondary | ICD-10-CM

## 2011-03-13 DIAGNOSIS — R5381 Other malaise: Secondary | ICD-10-CM

## 2011-03-13 DIAGNOSIS — R079 Chest pain, unspecified: Secondary | ICD-10-CM

## 2011-03-13 NOTE — Progress Notes (Signed)
  Subjective:    Patient ID: Colon Flattery, male    DOB: [birth year], 70 y.o.   MRN: [id number]  HPI A 70 year old patient who has a history of coronary artery disease hypertension asthma. For the past 3 days she has had some fatigue low-grade fever and chills. He has had some left anterior chest wall pain as well as some back pain. He is noticing increasing fatigue and exercise intolerance [facility] health [facility]. Denies any ischemic type chest pain. No shortness of breath or wheezing. No productive cough   Review of Systems  Constitutional: Positive for fever and fatigue. Negative for chills and appetite change.  HENT: Negative for hearing loss, ear pain, congestion, sore throat, trouble swallowing, neck stiffness, dental problem, voice change and tinnitus.   Eyes: Negative for pain, discharge and visual disturbance.  Respiratory: Negative for cough, chest tightness, wheezing and stridor.   Cardiovascular: Negative for chest pain, palpitations and leg swelling.  Gastrointestinal: Negative for nausea, vomiting, abdominal pain, diarrhea, constipation, blood in stool and abdominal distention.  Genitourinary: Negative for urgency, hematuria, flank pain, discharge, difficulty urinating and genital sores.  Musculoskeletal: Negative for myalgias, back pain, joint swelling, arthralgias and gait problem.  Skin: Negative for rash.  Neurological: Positive for weakness. Negative for dizziness, syncope, speech difficulty, numbness and headaches.  Hematological: Negative for adenopathy. Does not bruise/bleed easily.  Psychiatric/Behavioral: Negative for behavioral problems and dysphoric mood. The patient is not nervous/anxious.        Objective:   Physical Exam  Constitutional: He is oriented to person, place, and time. He appears well-developed and well-nourished. No distress.  HENT:  Head: Normocephalic.  Right Ear: External ear normal.  Left Ear: External ear normal.  Eyes: Conjunctivae and  EOM are normal.  Neck: Normal range of motion.  Cardiovascular: Normal rate and normal heart sounds.   Pulmonary/Chest: Breath sounds normal.       Slight tenderness left anterior chest wall  Abdominal: Bowel sounds are normal.  Musculoskeletal: Normal range of motion. He exhibits no edema and no tenderness.  Neurological: He is alert and oriented to person, place, and time.  Psychiatric: He has a normal mood and affect. His behavior is normal.          Assessment & Plan:   Viral URI. Will treat with Tylenol and tramadol Coronary artery disease stable Hypertension stable  Cough unimproved

## 2011-03-13 NOTE — Patient Instructions (Signed)
Get plenty of rest, Drink lots of  clear liquids, and use Tylenol or ibuprofen for fever and discomfort.    Call or return to clinic prn if these symptoms worsen or fail to improve as anticipated.  

## 2011-03-16 ENCOUNTER — Encounter: Payer: Self-pay | Admitting: Internal Medicine

## 2011-04-03 ENCOUNTER — Ambulatory Visit (INDEPENDENT_AMBULATORY_CARE_PROVIDER_SITE_OTHER): Payer: Medicare Other | Admitting: Cardiology

## 2011-04-03 ENCOUNTER — Encounter: Payer: Self-pay | Admitting: Cardiology

## 2011-04-03 DIAGNOSIS — I2510 Atherosclerotic heart disease of native coronary artery without angina pectoris: Secondary | ICD-10-CM

## 2011-04-03 DIAGNOSIS — E785 Hyperlipidemia, unspecified: Secondary | ICD-10-CM

## 2011-04-03 NOTE — Assessment & Plan Note (Signed)
I reviewed his lipid profile from October. It was excellent. No change in therapy is indicated.

## 2011-04-03 NOTE — Assessment & Plan Note (Signed)
The patient has no new sypmtoms.  No further cardiovascular testing is indicated.  We will continue with aggressive risk reduction and meds as listed.  He had a stress perfusion study in 2011

## 2011-04-03 NOTE — Patient Instructions (Signed)
Continue current medications as listed.  Follow up in 1 year with Dr Hochrein.  You will receive a letter in the mail 2 months before you are due.  Please call us when you receive this letter to schedule your follow up appointment.  

## 2011-04-03 NOTE — Progress Notes (Signed)
HPI  The patient returns for one year follow up.  Since I last saw him he has done well.  The patient denies any new symptoms such as chest discomfort, neck or arm discomfort. There has been no new shortness of breath, PND or orthopnea. There have been no reported palpitations, presyncope or syncope.  He did have prostate surgery and this has been quite painful.  He still exercises 6 hours per week.  Allergies  Allergen Reactions  . Bromfed     REACTION: n\T\v  . Clarithromycin     REACTION: gastritis  . Codeine   . Doxycycline Hyclate     REACTION: n\T\v  . Erythromycin Base     REACTION: rash  . Hydrocodone-Acetaminophen     REACTION: rash  . Modafinil     REACTION: anxiety-nervousness  . Promethazine Hcl     REACTION: fainting  . Sulfonamide Derivatives     REACTION: rash  . Telithromycin     REACTION: nausea    Current Outpatient Prescriptions  Medication Sig Dispense Refill  . albuterol (PROAIR HFA) 108 (90 BASE) MCG/ACT inhaler Inhale 2 puffs into the lungs every 6 (six) hours as needed.        [name] aspirin 81 MG tablet Take 81 mg by mouth daily.        . beclomethasone (QVAR) 80 MCG/ACT inhaler Inhale 2 puffs into the lungs daily.        [name] desloratadine (CLARINEX) 5 MG tablet Take 5 mg by mouth daily.        . diazepam (VALIUM) 5 MG tablet Take 1 tablet (5 mg total) by mouth at bedtime as needed.  30 tablet  3  . Eszopiclone (ESZOPICLONE) 3 MG TABS Take 3 mg by mouth as needed. Take immediately before bedtime      . fluticasone (FLONASE) 50 MCG/ACT nasal spray 2 SPRAY EACH NOSTRIL ONCE DAILY  16 g  4  . folic acid (FOLVITE) 1 MG tablet Take 1 mg by mouth daily.        [name] LAC-HYDRIN FIVE 5 % LOTN APPLY TO AFFECTED AREA TWO TIMES A DAY  340 mL  3  . nitroGLYCERIN (NITROSTAT) 0.4 MG SL tablet Place 0.4 mg under the tongue every 5 (five) minutes as needed.        [name] olmesartan-hydrochlorothiazide (BENICAR HCT) 20-12.5 MG per tablet Take 1 tablet by mouth daily.  30 tablet  11    . omeprazole (PRILOSEC) 20 MG capsule Take 20 mg by mouth daily.       . polyethylene glycol (MIRALAX / GLYCOLAX) packet Take 17 g by mouth daily.        . Probiotic Product (ALIGN) 4 MG CAPS Take 1 tablet by mouth daily.       . rosuvastatin (CRESTOR) 10 MG tablet Take 10 mg by mouth daily.        . vitamin B-12 (CYANOCOBALAMIN) 100 MCG tablet Take 50 mcg by mouth daily.        . Wheat Dextrin (BENEFIBER DRINK MIX) PACK Take by mouth. Mix powder in water         Past Medical History  Diagnosis Date  . Asthma   . CAD (coronary artery disease)   . Depression   . GERD (gastroesophageal reflux disease)   . Hyperlipidemia   . IBS (irritable bowel syndrome)   . Acute medial meniscal tear   . DJD (degenerative joint disease)   . Hypertension   . Anemia   .  Diverticulosis   . Obesity   . Pancreatitis   . PUD (peptic ulcer disease)   . Pneumonia   . Sleep apnea   . Prostate cancer     Past Surgical History  Procedure Date  . Thyroidectomy, partial   . Coronary artery bypass graft   . Cholecystectomy   . Cervical fusion   . Cervical laminectomy 05/26/06  . Arthroscopy rt knee 04/2008    daldoerf    ROS: Pain in his toes (probable neuropathy) As stated in the HPI and negative for all other systems.   PHYSICAL EXAM BP 128/75  Pulse 84  Ht 5\' 5"  (1.651 m)  Wt 184 lb (83.462 kg)  BMI 30.62 kg/m2 GENERAL:  Well appearing HEENT:  Pupils equal round and reactive, fundi not visualized, oral mucosa unremarkable NECK:  No jugular venous distention, waveform within normal limits, carotid upstroke brisk and symmetric, no bruits, no thyromegaly LYMPHATICS:  No cervical, inguinal adenopathy LUNGS:  Clear to auscultation bilaterally BACK:  No CVA tenderness CHEST:  Well healed sternotomy scar. HEART:  PMI not displaced or sustained,S1 and S2 within normal limits, no S3, no S4, no clicks, no rubs, no murmurs ABD:  Flat, positive bowel sounds normal in frequency in pitch, no bruits, no  rebound, no guarding, no midline pulsatile mass, no hepatomegaly, no splenomegaly EXT:  2 plus pulses throughout, no edema, no cyanosis no clubbing SKIN:  No rashes no nodules NEURO:  Cranial nerves II through XII grossly intact, motor grossly intact throughout [facility]:  Cognitively intact, oriented to person place and time  EKG:  03/13/11  sinus rhythm, rate 82, axis within normal limits, poor anterior R wave progression, no acute ST-T wave changes.  ASSESSMENT AND PLAN

## 2011-04-06 DIAGNOSIS — J309 Allergic rhinitis, unspecified: Secondary | ICD-10-CM | POA: Diagnosis not present

## 2011-04-08 ENCOUNTER — Encounter: Payer: Self-pay | Admitting: Internal Medicine

## 2011-04-08 ENCOUNTER — Ambulatory Visit (INDEPENDENT_AMBULATORY_CARE_PROVIDER_SITE_OTHER): Payer: Medicare Other | Admitting: Internal Medicine

## 2011-04-08 VITALS — BP 138/84 | HR 72 | Temp 98.1°F | Resp 16 | Ht 65.5 in | Wt 188.0 lb

## 2011-04-08 DIAGNOSIS — M792 Neuralgia and neuritis, unspecified: Secondary | ICD-10-CM

## 2011-04-08 DIAGNOSIS — I10 Essential (primary) hypertension: Secondary | ICD-10-CM

## 2011-04-08 DIAGNOSIS — E538 Deficiency of other specified B group vitamins: Secondary | ICD-10-CM | POA: Diagnosis not present

## 2011-04-08 DIAGNOSIS — R6889 Other general symptoms and signs: Secondary | ICD-10-CM | POA: Diagnosis not present

## 2011-04-08 LAB — TSH: TSH: 1.05 u[IU]/mL (ref 0.35–5.50)

## 2011-04-08 MED ORDER — AMITRIPTYLINE HCL 25 MG PO TABS: 25.0000 mg | ORAL_TABLET | Freq: Every day | ORAL | Status: DC

## 2011-04-08 NOTE — Progress Notes (Signed)
Subjective:    Patient ID: Joshua [name], male    DOB: 1940/10/21, 71 y.o.   MRN: [id number]  HPI Patient presents for followup visit for hyperlipidemia hypertension and history of gastroesophageal reflux he recently had a cardiology visit where they reviewed the indications for further testing and the stability of his risk factor reduction with control of his blood pressure and his lipids.  Cardiologist was well pleased with his progress and released him to our care for an entire year.  He is tolerating his therapy well and is compliant with his medications. He believes his weight is stable although on our scale it is slightly increased. He is exercising faithfully  His chief complaint is cold burning feet He reports that he is cold natured    Review of Systems  Constitutional: Negative for fever and fatigue.  HENT: Negative for hearing loss, congestion, neck pain and postnasal drip.   Eyes: Negative for discharge, redness and visual disturbance.  Respiratory: Negative for cough, shortness of breath and wheezing.   Cardiovascular: Negative for leg swelling.  Gastrointestinal: Negative for abdominal pain, constipation and abdominal distention.  Genitourinary: Negative for urgency and frequency.  Musculoskeletal: Negative for joint swelling and arthralgias.  Skin: Negative for color change and rash.  Neurological: Negative for weakness and light-headedness.  Hematological: Negative for adenopathy.  Psychiatric/Behavioral: Negative for behavioral problems.   Past Medical History  Diagnosis Date  . Asthma   . CAD (coronary artery disease)   . Depression   . GERD (gastroesophageal reflux disease)   . Hyperlipidemia   . IBS (irritable bowel syndrome)   . Acute medial meniscal tear   . DJD (degenerative joint disease)   . Hypertension   . Anemia   . Diverticulosis   . Obesity   . Pancreatitis   . PUD (peptic ulcer disease)   . Pneumonia   . Sleep apnea   . Prostate  cancer     History   Social History  . Marital Status: Married    Spouse Name: N/A    Number of Children: N/A  . Years of Education: N/A   Occupational History  . retired    Social History Main Topics  . Smoking status: Former Smoker    Quit date: 06/16/1992  . Smokeless tobacco: Not on file   Comment: smoked 1ppd for 35 years  . Alcohol Use: No  . Drug Use: No  . Sexually Active: Yes   Other Topics Concern  . Not on file   Social History Narrative  . No narrative on file    Past Surgical History  Procedure Date  . Thyroidectomy, partial   . Coronary artery bypass graft   . Cholecystectomy   . Cervical fusion   . Cervical laminectomy 05/26/06  . Arthroscopy rt knee 04/2008    daldoerf    Family History  Problem Relation Age of Onset  . Heart disease Father     Allergies  Allergen Reactions  . Bromfed     REACTION: n\T\v  . Clarithromycin     REACTION: gastritis  . Codeine   . Doxycycline Hyclate     REACTION: n\T\v  . Erythromycin Base     REACTION: rash  . Hydrocodone-Acetaminophen     REACTION: rash  . Modafinil     REACTION: anxiety-nervousness  . Promethazine Hcl     REACTION: fainting  . Sulfonamide Derivatives     REACTION: rash  . Telithromycin     REACTION: nausea  Current Outpatient Prescriptions on File Prior to Visit  Medication Sig Dispense Refill  . albuterol (PROAIR HFA) 108 (90 BASE) MCG/ACT inhaler Inhale 2 puffs into the lungs every 6 (six) hours as needed.        [name] aspirin 81 MG tablet Take 81 mg by mouth daily.        . beclomethasone (QVAR) 80 MCG/ACT inhaler Inhale 2 puffs into the lungs daily.        [name] desloratadine (CLARINEX) 5 MG tablet Take 5 mg by mouth daily.        . diazepam (VALIUM) 5 MG tablet Take 1 tablet (5 mg total) by mouth at bedtime as needed.  30 tablet  3  . Eszopiclone (ESZOPICLONE) 3 MG TABS Take 3 mg by mouth as needed. Take immediately before bedtime      . fluticasone (FLONASE) 50 MCG/ACT nasal  spray 2 SPRAY EACH NOSTRIL ONCE DAILY  16 g  4  . folic acid (FOLVITE) 1 MG tablet Take 1 mg by mouth daily.        [name] LAC-HYDRIN FIVE 5 % LOTN APPLY TO AFFECTED AREA TWO TIMES A DAY  340 mL  3  . nitroGLYCERIN (NITROSTAT) 0.4 MG SL tablet Place 0.4 mg under the tongue every 5 (five) minutes as needed.        [name] olmesartan-hydrochlorothiazide (BENICAR HCT) 20-12.5 MG per tablet Take 1 tablet by mouth daily.  30 tablet  11  . omeprazole (PRILOSEC) 20 MG capsule Take 20 mg by mouth daily.       . polyethylene glycol (MIRALAX / GLYCOLAX) packet Take 17 g by mouth daily.        . Probiotic Product (ALIGN) 4 MG CAPS Take 1 tablet by mouth daily.       . rosuvastatin (CRESTOR) 10 MG tablet Take 10 mg by mouth daily.        . vitamin B-12 (CYANOCOBALAMIN) 100 MCG tablet Take 50 mcg by mouth daily.        . Wheat Dextrin (BENEFIBER DRINK MIX) PACK Take by mouth. Mix powder in water         BP 138/84  Pulse 72  Temp 98.1 F (36.7 C)  Resp 16  Ht 5' 5.5" (1.664 m)  Wt 188 lb (85.276 kg)  BMI 30.81 kg/m2       Objective:   Physical Exam  Nursing note and vitals reviewed. Constitutional: He appears well-developed and well-nourished.  HENT:  Head: Normocephalic and atraumatic.  Eyes: Conjunctivae are normal. Pupils are equal, round, and reactive to light.  Neck: Normal range of motion. Neck supple.  Cardiovascular: Normal rate and regular rhythm.   Pulmonary/Chest: Effort normal and breath sounds normal.  Abdominal: Soft. Bowel sounds are normal.          Assessment & Plan:  Stable blood pressure, lipids and CV status.  GERD stable.  The cold she can be several different etiologies of which only a few are reversible. Certainly her blood pressure medicines can create cold feet although he is not on a beta blocker.  The beta agonist that he uses for his asthma and bronchitis can cause vasoconstriction the periphery. He does not have any symptoms or signs of vascular disease In the  most probable etiology is neurologic in origin we will obtain a B12 level And consider B12 supplementations he is on B12 orally at this time but may not absorb the oral preparation.  EMG and nerve conduction velocities can be  obtained to confirm this diagnoses and this will be done at the patient's pleasue

## 2011-04-08 NOTE — Patient Instructions (Signed)
The patient is instructed to continue all medications as prescribed. Schedule followup with check out clerk upon leaving the clinic  

## 2011-04-09 ENCOUNTER — Telehealth: Payer: Self-pay | Admitting: *Deleted

## 2011-04-09 LAB — VITAMIN B12: Vitamin B-12: 926 pg/mL — ABNORMAL HIGH (ref 211–911)

## 2011-04-09 NOTE — Telephone Encounter (Signed)
Notified pt. 

## 2011-04-09 NOTE — Telephone Encounter (Signed)
Per dr Lovell Sheehan take 1/2 tab

## 2011-04-09 NOTE — Telephone Encounter (Signed)
Pt. Cannot take the Amitriptyline.  Knocked him out for 12 hours.

## 2011-04-20 DIAGNOSIS — J309 Allergic rhinitis, unspecified: Secondary | ICD-10-CM | POA: Diagnosis not present

## 2011-04-24 DIAGNOSIS — H2510 Age-related nuclear cataract, unspecified eye: Secondary | ICD-10-CM | POA: Diagnosis not present

## 2011-04-24 DIAGNOSIS — H02839 Dermatochalasis of unspecified eye, unspecified eyelid: Secondary | ICD-10-CM | POA: Diagnosis not present

## 2011-05-04 DIAGNOSIS — J309 Allergic rhinitis, unspecified: Secondary | ICD-10-CM | POA: Diagnosis not present

## 2011-05-13 DIAGNOSIS — Z8546 Personal history of malignant neoplasm of prostate: Secondary | ICD-10-CM | POA: Diagnosis not present

## 2011-05-18 DIAGNOSIS — J309 Allergic rhinitis, unspecified: Secondary | ICD-10-CM | POA: Diagnosis not present

## 2011-05-20 DIAGNOSIS — N393 Stress incontinence (female) (male): Secondary | ICD-10-CM | POA: Diagnosis not present

## 2011-05-20 DIAGNOSIS — Z8546 Personal history of malignant neoplasm of prostate: Secondary | ICD-10-CM | POA: Diagnosis not present

## 2011-05-20 DIAGNOSIS — N529 Male erectile dysfunction, unspecified: Secondary | ICD-10-CM | POA: Diagnosis not present

## 2011-05-23 ENCOUNTER — Other Ambulatory Visit: Payer: Self-pay | Admitting: Internal Medicine

## 2011-05-25 ENCOUNTER — Other Ambulatory Visit: Payer: Self-pay | Admitting: Internal Medicine

## 2011-06-01 DIAGNOSIS — J309 Allergic rhinitis, unspecified: Secondary | ICD-10-CM | POA: Diagnosis not present

## 2011-06-03 DIAGNOSIS — M2020 Hallux rigidus, unspecified foot: Secondary | ICD-10-CM | POA: Diagnosis not present

## 2011-06-03 DIAGNOSIS — I739 Peripheral vascular disease, unspecified: Secondary | ICD-10-CM | POA: Diagnosis not present

## 2011-06-03 DIAGNOSIS — M79609 Pain in unspecified limb: Secondary | ICD-10-CM | POA: Diagnosis not present

## 2011-06-03 DIAGNOSIS — B351 Tinea unguium: Secondary | ICD-10-CM | POA: Diagnosis not present

## 2011-06-16 DIAGNOSIS — J309 Allergic rhinitis, unspecified: Secondary | ICD-10-CM | POA: Diagnosis not present

## 2011-06-18 DIAGNOSIS — I739 Peripheral vascular disease, unspecified: Secondary | ICD-10-CM | POA: Diagnosis not present

## 2011-06-29 DIAGNOSIS — J309 Allergic rhinitis, unspecified: Secondary | ICD-10-CM | POA: Diagnosis not present

## 2011-07-08 ENCOUNTER — Ambulatory Visit (INDEPENDENT_AMBULATORY_CARE_PROVIDER_SITE_OTHER): Payer: Medicare Other | Admitting: Internal Medicine

## 2011-07-08 ENCOUNTER — Ambulatory Visit: Payer: Medicare Other | Admitting: Internal Medicine

## 2011-07-08 VITALS — BP 132/80 | HR 72 | Temp 98.6°F | Resp 16 | Ht 65.5 in | Wt 187.0 lb

## 2011-07-08 DIAGNOSIS — D649 Anemia, unspecified: Secondary | ICD-10-CM

## 2011-07-08 DIAGNOSIS — I10 Essential (primary) hypertension: Secondary | ICD-10-CM

## 2011-07-08 DIAGNOSIS — I2510 Atherosclerotic heart disease of native coronary artery without angina pectoris: Secondary | ICD-10-CM

## 2011-07-08 LAB — CBC WITH DIFFERENTIAL/PLATELET
Basophils Absolute: 0 10*3/uL (ref 0.0–0.1)
Basophils Relative: 0.4 % (ref 0.0–3.0)
Eosinophils Absolute: 0.2 10*3/uL (ref 0.0–0.7)
Eosinophils Relative: 2.6 % (ref 0.0–5.0)
HCT: 42.8 % (ref 39.0–52.0)
Hemoglobin: 14.6 g/dL (ref 13.0–17.0)
Lymphocytes Relative: 13.4 % (ref 12.0–46.0)
Lymphs Abs: 1 10*3/uL (ref 0.7–4.0)
MCHC: 34.1 g/dL (ref 30.0–36.0)
MCV: 91.8 fl (ref 78.0–100.0)
Monocytes Absolute: 0.7 10*3/uL (ref 0.1–1.0)
Monocytes Relative: 9.1 % (ref 3.0–12.0)
Neutro Abs: 5.7 10*3/uL (ref 1.4–7.7)
Neutrophils Relative %: 74.5 % (ref 43.0–77.0)
Platelets: 194 10*3/uL (ref 150.0–400.0)
RBC: 4.67 Mil/uL (ref 4.22–5.81)
RDW: 12.3 % (ref 11.5–14.6)
WBC: 7.7 10*3/uL (ref 4.5–10.5)

## 2011-07-08 LAB — BASIC METABOLIC PANEL
BUN: 19 mg/dL (ref 6–23)
CO2: 28 mEq/L (ref 19–32)
Calcium: 9.4 mg/dL (ref 8.4–10.5)
Chloride: 98 mEq/L (ref 96–112)
Creatinine, Ser: 1.4 mg/dL (ref 0.4–1.5)
GFR: 54.86 mL/min — ABNORMAL LOW (ref >60.00)
Glucose, Bld: 93 mg/dL (ref 70–99)
Potassium: 4.9 mEq/L (ref 3.5–5.1)
Sodium: 133 mEq/L — ABNORMAL LOW (ref 135–145)

## 2011-07-08 LAB — IRON: Iron: 110 ug/dL (ref 42–165)

## 2011-07-08 NOTE — Progress Notes (Signed)
Subjective:    Patient ID: Joshua [name], male    DOB: Jan 07, 1941, 71 y.o.   MRN: [id number]  HPI Patient is a 71 year old male who is followed for hypertension hyperlipidemia and gastroesophageal reflux with a history of prostate cancer.  He has also had symptomology including chronic tendinitis has a history of coronary artery disease is stable and a history of iron deficiency anemia.  We're do monitoring of his CBC with differential and iron levels to make sure that they are stable at his last office visit he had a thyroid which was normal He also had a B12 level which was 962 she is in normal range for him.  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  HENT: Negative for hearing loss, congestion, neck pain and postnasal drip.   Eyes: Negative for discharge, redness and visual disturbance.  Respiratory: Negative for cough, shortness of breath and wheezing.   Cardiovascular: Negative for leg swelling.  Gastrointestinal: Negative for abdominal pain, constipation and abdominal distention.  Genitourinary: Negative for urgency and frequency.  Musculoskeletal: Negative for joint swelling and arthralgias.  Skin: Negative for color change and rash.  Neurological: Negative for weakness and light-headedness.  Hematological: Negative for adenopathy.  Psychiatric/Behavioral: Negative for behavioral problems.   Past Medical History  Diagnosis Date  . Asthma   . CAD (coronary artery disease)   . Depression   . GERD (gastroesophageal reflux disease)   . Hyperlipidemia   . IBS (irritable bowel syndrome)   . Acute medial meniscal tear   . DJD (degenerative joint disease)   . Hypertension   . Anemia   . Diverticulosis   . Obesity   . Pancreatitis   . PUD (peptic ulcer disease)   . Pneumonia   . Sleep apnea   . Prostate cancer     History   Social History  . Marital Status: Married   Spouse Name: N/A    Number of Children: N/A  . Years of Education: N/A   Occupational History  . retired    Social History Main Topics  . Smoking status: Former Smoker    Quit date: 06/16/1992  . Smokeless tobacco: Not on file   Comment: smoked 1ppd for 35 years  . Alcohol Use: No  . Drug Use: No  . Sexually Active: Yes   Other Topics Concern  . Not on file   Social History Narrative  . No narrative on file    Past Surgical History  Procedure Date  . Thyroidectomy, partial   . Coronary artery bypass graft   . Cholecystectomy   . Cervical fusion   . Cervical laminectomy 05/26/06  . Arthroscopy rt knee 04/2008    daldoerf    Family History  Problem Relation Age of Onset  . Heart disease Father     Allergies  Allergen Reactions  . Bromfed     REACTION: n\T\v  . Clarithromycin     REACTION: gastritis  . Codeine   . Doxycycline Hyclate     REACTION: n\T\v  . Erythromycin Base     REACTION: rash  . Hydrocodone-Acetaminophen     REACTION: rash  . Modafinil     REACTION: anxiety-nervousness  . Promethazine Hcl     REACTION: fainting  . Sulfonamide Derivatives     REACTION: rash  . Telithromycin     REACTION: nausea    Current Outpatient Prescriptions on File Prior to  Visit  Medication Sig Dispense Refill  . albuterol (PROAIR HFA) 108 (90 BASE) MCG/ACT inhaler Inhale 2 puffs into the lungs every 6 (six) hours as needed.        [name] amitriptyline (ELAVIL) 25 MG tablet Take 1 tablet (25 mg total) by mouth at bedtime.  30 tablet  11  . aspirin 81 MG tablet Take 81 mg by mouth daily.        . beclomethasone (QVAR) 80 MCG/ACT inhaler Inhale 2 puffs into the lungs daily.        [name] desloratadine (CLARINEX) 5 MG tablet Take 5 mg by mouth daily.        . diazepam (VALIUM) 5 MG tablet Take 1 tablet (5 mg total) by mouth at bedtime as needed.  30 tablet  3  . Eszopiclone (ESZOPICLONE) 3 MG TABS Take 3 mg by mouth as needed. Take immediately before bedtime      .  fluticasone (FLONASE) 50 MCG/ACT nasal spray 2 SPRAY EACH NOSTRIL ONCE DAILY  16 g  4  . folic acid (FOLVITE) 1 MG tablet Take 1 mg by mouth daily.        [name] LAC-HYDRIN FIVE 5 % LOTN lotion APPLY TO AFFECTED AREA(S) TWICE DAILY  222 mL  3  . nitroGLYCERIN (NITROSTAT) 0.4 MG SL tablet Place 0.4 mg under the tongue every 5 (five) minutes as needed.        [name] olmesartan-hydrochlorothiazide (BENICAR HCT) 20-12.5 MG per tablet Take 1 tablet by mouth daily.  30 tablet  11  . omeprazole (PRILOSEC) 20 MG capsule Take 20 mg by mouth daily.       . polyethylene glycol (MIRALAX / GLYCOLAX) packet Take 17 g by mouth daily.        . Probiotic Product (ALIGN) 4 MG CAPS Take 1 tablet by mouth daily.       . rosuvastatin (CRESTOR) 10 MG tablet Take 10 mg by mouth daily.        . vitamin B-12 (CYANOCOBALAMIN) 100 MCG tablet Take 50 mcg by mouth daily.        . Wheat Dextrin (BENEFIBER DRINK MIX) PACK Take by mouth. Mix powder in water       . DISCONTD: LAC-HYDRIN FIVE 5 % LOTN lotion APPLY TO AFFECTED AREA(S) TWICE DAILY  222 mL  6    BP 132/80  Pulse 72  Temp 98.6 F (37 C)  Resp 16  Ht 5' 5.5" (1.664 m)  Wt 187 lb (84.823 kg)  BMI 30.65 kg/m2       Objective:   Physical Exam  Nursing note and vitals reviewed. Constitutional: He appears well-developed and well-nourished.  HENT:  Head: Normocephalic and atraumatic.  Eyes: Conjunctivae are normal. Pupils are equal, round, and reactive to light.  Neck: Normal range of motion. Neck supple.  Cardiovascular: Normal rate and regular rhythm.   Pulmonary/Chest: Effort normal and breath sounds normal.  Abdominal: Soft. Bowel sounds are normal.          Assessment & Plan:  She is exercising regularly his asthma has been stable his hypertension is stable his coronary artery disease is stable.  I believe he is doing all the things that he should maintain excellent health at this time we will monitor the basic labs that are necessary for health  maintenance including a CBC differential and iron level and a basic metabolic panel that will monitor his creatinine and potassium

## 2011-07-08 NOTE — Patient Instructions (Signed)
Try ginger as a supplement from [facility]

## 2011-07-13 DIAGNOSIS — J309 Allergic rhinitis, unspecified: Secondary | ICD-10-CM | POA: Diagnosis not present

## 2011-07-22 ENCOUNTER — Encounter: Payer: Self-pay | Admitting: Internal Medicine

## 2011-07-22 ENCOUNTER — Ambulatory Visit (INDEPENDENT_AMBULATORY_CARE_PROVIDER_SITE_OTHER): Payer: Medicare Other | Admitting: Internal Medicine

## 2011-07-22 VITALS — BP 124/80 | Temp 98.0°F | Wt 189.0 lb

## 2011-07-22 DIAGNOSIS — I10 Essential (primary) hypertension: Secondary | ICD-10-CM | POA: Diagnosis not present

## 2011-07-22 DIAGNOSIS — J069 Acute upper respiratory infection, unspecified: Secondary | ICD-10-CM

## 2011-07-22 DIAGNOSIS — J45909 Unspecified asthma, uncomplicated: Secondary | ICD-10-CM | POA: Diagnosis not present

## 2011-07-22 NOTE — Patient Instructions (Signed)
Get plenty of rest, Drink lots of  clear liquids, and use Tylenol or ibuprofen for fever and discomfort.    Call or return to clinic prn if these symptoms worsen or fail to improve as anticipated.  

## 2011-07-22 NOTE — Progress Notes (Signed)
  Subjective:    Patient ID: Joshua [name], male    DOB: 1940/05/04, 71 y.o.   MRN: [id number]  HPI   71 year old patient who has a history of asthma and allergic rhinitis. He has treated hypertension. For the past week he has been a bit unwell. He states he has had 2 mild asthma episodes requiring short-term albuterol treatment. He is on a number of maintenance allergy medications. Yesterday he developed increasing cold symptoms with chilliness sore throat and nasal congestion no fever or productive cough. No recurrent asthma     Review of Systems  Constitutional: Positive for chills and fatigue. Negative for fever and appetite change.  HENT: Positive for congestion and sore throat. Negative for hearing loss, ear pain, trouble swallowing, neck stiffness, dental problem, voice change and tinnitus.   Eyes: Negative for pain, discharge and visual disturbance.  Respiratory: Positive for wheezing. Negative for cough, chest tightness and stridor.   Cardiovascular: Negative for chest pain, palpitations and leg swelling.  Gastrointestinal: Negative for nausea, vomiting, abdominal pain, diarrhea, constipation, blood in stool and abdominal distention.  Genitourinary: Negative for urgency, hematuria, flank pain, discharge, difficulty urinating and genital sores.  Musculoskeletal: Negative for myalgias, back pain, joint swelling, arthralgias and gait problem.  Skin: Negative for rash.  Neurological: Positive for weakness. Negative for dizziness, syncope, speech difficulty, numbness and headaches.  Hematological: Negative for adenopathy. Does not bruise/bleed easily.  Psychiatric/Behavioral: Negative for behavioral problems and dysphoric mood. The patient is not nervous/anxious.        Objective:   Physical Exam  Constitutional: He is oriented to person, place, and time. He appears well-developed.  HENT:  Head: Normocephalic.  Right Ear: External ear normal.  Left Ear: External ear normal.     Mild erythema of the oropharynx  Eyes: Conjunctivae and EOM are normal.  Neck: Normal range of motion.  Cardiovascular: Normal rate and normal heart sounds.   Pulmonary/Chest: Effort normal and breath sounds normal. No respiratory distress. He has no wheezes. He has no rales.  Abdominal: Bowel sounds are normal.  Musculoskeletal: Normal range of motion. He exhibits no edema and no tenderness.  Neurological: He is alert and oriented to person, place, and time.  Psychiatric: He has a normal mood and affect. His behavior is normal.          Assessment & Plan:  Viral URI. Will treat symptomatically Allergic rhinitis Hypertension controlled Asthma stable

## 2011-07-25 ENCOUNTER — Ambulatory Visit (INDEPENDENT_AMBULATORY_CARE_PROVIDER_SITE_OTHER): Payer: Medicare Other | Admitting: Family Medicine

## 2011-07-25 ENCOUNTER — Encounter: Payer: Self-pay | Admitting: Family Medicine

## 2011-07-25 VITALS — BP 140/84 | Temp 97.7°F | Wt 187.4 lb

## 2011-07-25 DIAGNOSIS — J329 Chronic sinusitis, unspecified: Secondary | ICD-10-CM

## 2011-07-25 DIAGNOSIS — I10 Essential (primary) hypertension: Secondary | ICD-10-CM | POA: Diagnosis not present

## 2011-07-25 DIAGNOSIS — J019 Acute sinusitis, unspecified: Secondary | ICD-10-CM | POA: Diagnosis not present

## 2011-07-25 MED ORDER — AMOXICILLIN-POT CLAVULANATE 875-125 MG PO TABS: 1.0000 | ORAL_TABLET | Freq: Two times a day (BID) | ORAL | Status: AC

## 2011-07-25 MED ORDER — PREDNISONE 20 MG PO TABS: 20.0000 mg | ORAL_TABLET | Freq: Two times a day (BID) | ORAL | Status: AC

## 2011-07-25 MED ORDER — GUAIFENESIN ER 600 MG PO TB12: 600.0000 mg | ORAL_TABLET | Freq: Two times a day (BID) | ORAL | Status: DC

## 2011-07-25 NOTE — Patient Instructions (Addendum)
Sinusitis Sinuses are air pockets within the bones of your face. The growth of bacteria within a sinus leads to infection. The infection prevents the sinuses from draining. This infection is called sinusitis. SYMPTOMS  There will be different areas of pain depending on which sinuses have become infected.  The maxillary sinuses often produce pain beneath the eyes.   Frontal sinusitis may cause pain in the middle of the forehead and above the eyes.  Other problems (symptoms) include:  Toothaches.   Colored, pus-like (purulent) drainage from the nose.   Swelling, warmth, and tenderness over the sinus areas may be signs of infection.  TREATMENT  Sinusitis is most often determined by an exam.X-rays may be taken. If x-rays have been taken, make sure you obtain your results or find out how you are to obtain them. Your caregiver may give you medications (antibiotics). These are medications that will help kill the bacteria causing the infection. You may also be given a medication (decongestant) that helps to reduce sinus swelling.  HOME CARE INSTRUCTIONS   Only take over-the-counter or prescription medicines for pain, discomfort, or fever as directed by your caregiver.   Drink extra fluids. Fluids help thin the mucus so your sinuses can drain more easily.   Applying either moist heat or ice packs to the sinus areas may help relieve discomfort.   Use saline nasal sprays to help moisten your sinuses. The sprays can be found at your local drugstore.  SEEK IMMEDIATE MEDICAL CARE IF:  You have a fever.   You have increasing pain, severe headaches, or toothache.   You have nausea, vomiting, or drowsiness.   You develop unusual swelling around the face or trouble seeing.  MAKE SURE YOU:   Understand these instructions.   Will watch your condition.   Will get help right away if you are not doing well or get worse.  Document Released: 03/09/2005 Document Revised: 02/26/2011 Document Reviewed:  10/06/2006 [employer] Patient Information 2012 [place], [place].  Eat a yogurt and/or take a probiotic such as Align daily  Increase fluids

## 2011-07-26 ENCOUNTER — Encounter: Payer: Self-pay | Admitting: Family Medicine

## 2011-07-26 NOTE — Assessment & Plan Note (Signed)
Adequately controlled, no change in meds 

## 2011-07-26 NOTE — Assessment & Plan Note (Signed)
Mucinex bid, increase fluids and rest. Given rx for Augmentin and Prednisone

## 2011-07-26 NOTE — Progress Notes (Signed)
Patient ID: Joshua [name], male   DOB: Jun 28, 1940, 71 y.o.   MRN: [id number] GENTRY PILSON [id number] [birth year] 07/26/2011      Progress Note-Follow Up  Subjective  Chief Complaint  Chief Complaint  Patient presents with  . Sinusitis    saw Dr Amador Cunas 3 days ago, sx getting worse    HPI  Patient is a 71 year old male in today with complaints of fevers, chills, malaise, myalgia, headache, tooth pain, nasal congestion with clear rhinorrhea. Also notes facial pain he describes his mouth and itching which is syncope frequently says he has when he gets a sinus infection. He also notes some anorexia but denies nausea vomiting, diarrhea. Denies chest pain, palpitations, increased shortness of breath or wheezing. Has not needed increased albuterol use.   Past Medical History  Diagnosis Date  . Asthma   . CAD (coronary artery disease)   . Depression   . GERD (gastroesophageal reflux disease)   . Hyperlipidemia   . IBS (irritable bowel syndrome)   . Acute medial meniscal tear   . DJD (degenerative joint disease)   . Hypertension   . Anemia   . Diverticulosis   . Obesity   . Pancreatitis   . PUD (peptic ulcer disease)   . Pneumonia   . Sleep apnea   . Prostate cancer   . Acute sinusitis 12/14/2006    Qualifier: Diagnosis of  By: Lovell Sheehan MD, Balinda Quails     Past Surgical History  Procedure Date  . Thyroidectomy, partial   . Coronary artery bypass graft   . Cholecystectomy   . Cervical fusion   . Cervical laminectomy 05/26/06  . Arthroscopy rt knee 04/2008    daldoerf    Family History  Problem Relation Age of Onset  . Heart disease Father     History   Social History  . Marital Status: Married    Spouse Name: N/A    Number of Children: N/A  . Years of Education: N/A   Occupational History  . retired    Social History Main Topics  . Smoking status: Former Smoker    Quit date: 06/16/1992  . Smokeless tobacco: Not on file   Comment: smoked 1ppd for 35 years   . Alcohol Use: No  . Drug Use: No  . Sexually Active: Yes   Other Topics Concern  . Not on file   Social History Narrative  . No narrative on file    Current Outpatient Prescriptions on File Prior to Visit  Medication Sig Dispense Refill  . albuterol (PROAIR HFA) 108 (90 BASE) MCG/ACT inhaler Inhale 2 puffs into the lungs every 6 (six) hours as needed.        [name] amitriptyline (ELAVIL) 25 MG tablet Take 1 tablet (25 mg total) by mouth at bedtime.  30 tablet  11  . aspirin 81 MG tablet Take 81 mg by mouth daily.        . beclomethasone (QVAR) 80 MCG/ACT inhaler Inhale 2 puffs into the lungs daily.        [name] desloratadine (CLARINEX) 5 MG tablet Take 5 mg by mouth daily.        . diazepam (VALIUM) 5 MG tablet Take 1 tablet (5 mg total) by mouth at bedtime as needed.  30 tablet  3  . Eszopiclone (ESZOPICLONE) 3 MG TABS Take 3 mg by mouth as needed. Take immediately before bedtime      . fluticasone (FLONASE) 50 MCG/ACT nasal spray 2 SPRAY EACH NOSTRIL  ONCE DAILY  16 g  4  . folic acid (FOLVITE) 1 MG tablet Take 1 mg by mouth daily.        [name] LAC-HYDRIN FIVE 5 % LOTN lotion APPLY TO AFFECTED AREA(S) TWICE DAILY  222 mL  3  . nitroGLYCERIN (NITROSTAT) 0.4 MG SL tablet Place 0.4 mg under the tongue every 5 (five) minutes as needed.        [name] olmesartan-hydrochlorothiazide (BENICAR HCT) 20-12.5 MG per tablet Take 1 tablet by mouth daily.  30 tablet  11  . omeprazole (PRILOSEC) 20 MG capsule Take 20 mg by mouth daily.       . polyethylene glycol (MIRALAX / GLYCOLAX) packet Take 17 g by mouth daily.        . Probiotic Product (ALIGN) 4 MG CAPS Take 1 tablet by mouth daily.       . rosuvastatin (CRESTOR) 10 MG tablet Take 10 mg by mouth daily.        . vitamin B-12 (CYANOCOBALAMIN) 100 MCG tablet Take 50 mcg by mouth daily.        . Wheat Dextrin (BENEFIBER DRINK MIX) PACK Take by mouth. Mix powder in water         Allergies  Allergen Reactions  . Bromfed     REACTION: n\T\v  . Clarithromycin      REACTION: gastritis  . Codeine   . Doxycycline Hyclate     REACTION: n\T\v  . Erythromycin Base     REACTION: rash  . Hydrocodone-Acetaminophen     REACTION: rash  . Modafinil     REACTION: anxiety-nervousness  . Promethazine Hcl     REACTION: fainting  . Sulfonamide Derivatives     REACTION: rash  . Telithromycin     REACTION: nausea    Review of Systems  Review of Systems  Constitutional: Positive for fever and chills. Negative for malaise/fatigue.  HENT: Positive for ear pain and congestion.   Eyes: Negative for discharge.  Respiratory: Positive for cough and sputum production. Negative for shortness of breath.   Cardiovascular: Negative for chest pain, palpitations and leg swelling.  Gastrointestinal: Positive for nausea. Negative for abdominal pain and diarrhea.  Genitourinary: Negative for dysuria.  Musculoskeletal: Negative for falls.  Skin: Negative for rash.  Neurological: Positive for headaches. Negative for loss of consciousness.  Endo/Heme/Allergies: Negative for polydipsia.  Psychiatric/Behavioral: Negative for depression and suicidal ideas. The patient is not nervous/anxious and does not have insomnia.     Objective  BP 140/84  Temp(Src) 97.7 F (36.5 C) (Oral)  Wt 187 lb 6.4 oz (85.004 kg)  Physical Exam  Physical Exam  Constitutional: He is oriented to person, place, and time and well-developed, well-nourished, and in no distress. No distress.  HENT:  Head: Normocephalic and atraumatic.       TMs dull and retracted, nasal mucosa swollen and erythematous  Eyes: Conjunctivae are normal.  Neck: Neck supple. No thyromegaly present.  Cardiovascular: Normal rate, regular rhythm and normal heart sounds.   No murmur heard. Pulmonary/Chest: Effort normal and breath sounds normal. No respiratory distress.  Abdominal: He exhibits no distension and no mass. There is no tenderness.  Musculoskeletal: He exhibits no edema.  Lymphadenopathy:    He has  cervical adenopathy.  Neurological: He is alert and oriented to person, place, and time.  Skin: Skin is warm.  Psychiatric: Memory, affect and judgment normal.    Lab Results  Component Value Date   TSH 1.05 04/08/2011   Lab Results  Component Value Date   WBC 7.7 07/08/2011   HGB 14.6 07/08/2011   HCT 42.8 07/08/2011   MCV 91.8 07/08/2011   PLT 194.0 07/08/2011   Lab Results  Component Value Date   CREATININE 1.4 07/08/2011   BUN 19 07/08/2011   NA 133* 07/08/2011   K 4.9 07/08/2011   CL 98 07/08/2011   CO2 28 07/08/2011   Lab Results  Component Value Date   ALT 26 02/18/2010   AST 28 02/18/2010   ALKPHOS 60 02/18/2010   BILITOT 0.6 02/18/2010   Lab Results  Component Value Date   CHOL 152 07/04/2009   Lab Results  Component Value Date   HDL 44.20 07/04/2009   Lab Results  Component Value Date   LDLCALC 56 10/12/2008   Lab Results  Component Value Date   TRIG 113.0 10/12/2008   Lab Results  Component Value Date   CHOLHDL 3 10/12/2008     Assessment & Plan  HYPERTENSION Adequately controlled, no change in meds.  Acute sinusitis Mucinex bid, increase fluids and rest. Given rx for Augmentin and Prednisone

## 2011-07-30 ENCOUNTER — Telehealth: Payer: Self-pay | Admitting: *Deleted

## 2011-07-30 NOTE — Telephone Encounter (Signed)
Duplicate Note.  Duplicate antibiotic??

## 2011-08-03 DIAGNOSIS — J309 Allergic rhinitis, unspecified: Secondary | ICD-10-CM | POA: Diagnosis not present

## 2011-08-12 ENCOUNTER — Other Ambulatory Visit: Payer: Self-pay | Admitting: Internal Medicine

## 2011-08-24 DIAGNOSIS — J309 Allergic rhinitis, unspecified: Secondary | ICD-10-CM | POA: Diagnosis not present

## 2011-08-25 DIAGNOSIS — L819 Disorder of pigmentation, unspecified: Secondary | ICD-10-CM | POA: Diagnosis not present

## 2011-08-25 DIAGNOSIS — L821 Other seborrheic keratosis: Secondary | ICD-10-CM | POA: Diagnosis not present

## 2011-08-25 DIAGNOSIS — D1801 Hemangioma of skin and subcutaneous tissue: Secondary | ICD-10-CM | POA: Diagnosis not present

## 2011-08-25 DIAGNOSIS — D239 Other benign neoplasm of skin, unspecified: Secondary | ICD-10-CM | POA: Diagnosis not present

## 2011-09-04 ENCOUNTER — Other Ambulatory Visit: Payer: Self-pay | Admitting: *Deleted

## 2011-09-04 MED ORDER — DIAZEPAM 5 MG PO TABS: 5.0000 mg | ORAL_TABLET | Freq: Every evening | ORAL | Status: DC | PRN

## 2011-09-07 DIAGNOSIS — J309 Allergic rhinitis, unspecified: Secondary | ICD-10-CM | POA: Diagnosis not present

## 2011-09-14 ENCOUNTER — Telehealth: Payer: Self-pay | Admitting: Internal Medicine

## 2011-09-14 MED ORDER — ESZOPICLONE 3 MG PO TABS: 3.0000 mg | ORAL_TABLET | Freq: Every day | ORAL | Status: DC

## 2011-09-14 NOTE — Telephone Encounter (Signed)
Per dr Lovell Sheehan- ok to send in lunesta 3 mg 1 qhs prn sleep- pt informed and med called in

## 2011-09-14 NOTE — Telephone Encounter (Signed)
Caller: Javani/Patient is calling with a question about Lunesta.The medication was written by Darryll Capers, last in 2010.   Pt is taking Lunesta 2  infrequently, and  only takes it if having issues with sleeping or on vacation.  States "this is not strong enough, doesn't seem to help, only sleeps 4-5 hours when he takes it."  Triage offered and declined, asking if Dr. Lovell Sheehan can call in RX for Lunesta 4 to [facility], [phone].   PLEASE CALL PATIENT TO ADVISE IF NEW SCRIPT HAS BEEN CALLED IN.  THANKS.

## 2011-09-18 DIAGNOSIS — B0052 Herpesviral keratitis: Secondary | ICD-10-CM | POA: Diagnosis not present

## 2011-09-18 DIAGNOSIS — J309 Allergic rhinitis, unspecified: Secondary | ICD-10-CM | POA: Diagnosis not present

## 2011-09-21 DIAGNOSIS — B0052 Herpesviral keratitis: Secondary | ICD-10-CM | POA: Diagnosis not present

## 2011-10-01 DIAGNOSIS — B0052 Herpesviral keratitis: Secondary | ICD-10-CM | POA: Diagnosis not present

## 2011-10-05 DIAGNOSIS — J309 Allergic rhinitis, unspecified: Secondary | ICD-10-CM | POA: Diagnosis not present

## 2011-10-07 ENCOUNTER — Telehealth: Payer: Self-pay | Admitting: Internal Medicine

## 2011-10-07 NOTE — Telephone Encounter (Signed)
Caller: Buzz/Patient; PCP: Darryll Capers; CB#: [phone]; ; ; Call regarding Back Pain;  Has had back pain for "5 weks" and pain is localized to one area in back. Moved heavy table 5 weeks ago and caused pain in "sacroiliac". Has been using heating pad with minimal relief. Appointment scheduled for 7-19 at 1130 with Dr. Lovell Sheehan.

## 2011-10-09 ENCOUNTER — Encounter: Payer: Self-pay | Admitting: Internal Medicine

## 2011-10-09 ENCOUNTER — Ambulatory Visit (INDEPENDENT_AMBULATORY_CARE_PROVIDER_SITE_OTHER): Payer: Medicare Other | Admitting: Internal Medicine

## 2011-10-09 VITALS — BP 140/80 | HR 72 | Temp 98.6°F | Resp 16 | Ht 65.5 in | Wt 186.0 lb

## 2011-10-09 DIAGNOSIS — M545 Low back pain, unspecified: Secondary | ICD-10-CM | POA: Diagnosis not present

## 2011-10-09 DIAGNOSIS — K589 Irritable bowel syndrome without diarrhea: Secondary | ICD-10-CM | POA: Diagnosis not present

## 2011-10-09 DIAGNOSIS — M79605 Pain in left leg: Secondary | ICD-10-CM

## 2011-10-09 DIAGNOSIS — M549 Dorsalgia, unspecified: Secondary | ICD-10-CM | POA: Diagnosis not present

## 2011-10-09 DIAGNOSIS — K5900 Constipation, unspecified: Secondary | ICD-10-CM | POA: Diagnosis not present

## 2011-10-09 DIAGNOSIS — G8929 Other chronic pain: Secondary | ICD-10-CM | POA: Insufficient documentation

## 2011-10-09 MED ORDER — TIZANIDINE HCL 4 MG PO TABS: 4.0000 mg | ORAL_TABLET | Freq: Four times a day (QID) | ORAL | Status: AC | PRN

## 2011-10-09 MED ORDER — METHYLPREDNISOLONE (PAK) 4 MG PO TABS: ORAL_TABLET | ORAL | Status: AC

## 2011-10-09 NOTE — Patient Instructions (Addendum)
Use the tramadol for the pain

## 2011-10-09 NOTE — Progress Notes (Signed)
Subjective:    Patient ID: Joshua [name], male    DOB: 1940-07-29, 71 y.o.   MRN: [id number]  HPI  Patient is a 71 year old male who is followed for multiple medical problems who presents today for an acute complaint of low back pain this is different than prior back pain in that it radiates to the left hand side he feels the pain radiated into the left buttock and down the left leg.  He states that he can find a precipitating event of lifting an object and the pain started after that the pain has been going on for over 2 weeks Blood pressure stable  Review of Systems  Constitutional: Negative for fever and fatigue.  HENT: Negative for hearing loss, congestion, neck pain and postnasal drip.   Eyes: Negative for discharge, redness and visual disturbance.  Respiratory: Negative for cough, shortness of breath and wheezing.   Cardiovascular: Negative for leg swelling.  Gastrointestinal: Negative for abdominal pain, constipation and abdominal distention.  Genitourinary: Negative for urgency and frequency.  Musculoskeletal: Negative for joint swelling and arthralgias.  Skin: Negative for color change and rash.  Neurological: Negative for weakness and light-headedness.  Hematological: Negative for adenopathy.  Psychiatric/Behavioral: Negative for behavioral problems.   Past Medical History  Diagnosis Date  . Asthma   . CAD (coronary artery disease)   . Depression   . GERD (gastroesophageal reflux disease)   . Hyperlipidemia   . IBS (irritable bowel syndrome)   . Acute medial meniscal tear   . DJD (degenerative joint disease)   . Hypertension   . Anemia   . Diverticulosis   . Obesity   . Pancreatitis   . PUD (peptic ulcer disease)   . Pneumonia   . Sleep apnea   . Prostate cancer   . Acute sinusitis 12/14/2006    Qualifier: Diagnosis of  By: Lovell Sheehan MD, Balinda Quails     History   Social History  . Marital Status: Married    Spouse Name: N/A    Number of Children: N/A  .  Years of Education: N/A   Occupational History  . retired    Social History Main Topics  . Smoking status: Former Smoker    Quit date: 06/16/1992  . Smokeless tobacco: Not on file   Comment: smoked 1ppd for 35 years  . Alcohol Use: No  . Drug Use: No  . Sexually Active: Yes   Other Topics Concern  . Not on file   Social History Narrative  . No narrative on file    Past Surgical History  Procedure Date  . Thyroidectomy, partial   . Coronary artery bypass graft   . Cholecystectomy   . Cervical fusion   . Cervical laminectomy 05/26/06  . Arthroscopy rt knee 04/2008    daldoerf    Family History  Problem Relation Age of Onset  . Heart disease Father     Allergies  Allergen Reactions  . Bromfed     REACTION: n\T\v  . Clarithromycin     REACTION: gastritis  . Codeine   . Doxycycline Hyclate     REACTION: n\T\v  . Erythromycin Base     REACTION: rash  . Hydrocodone-Acetaminophen     REACTION: rash  . Modafinil     REACTION: anxiety-nervousness  . Promethazine Hcl     REACTION: fainting  . Sulfonamide Derivatives     REACTION: rash  . Telithromycin     REACTION: nausea    Current Outpatient Prescriptions on  File Prior to Visit  Medication Sig Dispense Refill  . albuterol (PROAIR HFA) 108 (90 BASE) MCG/ACT inhaler Inhale 2 puffs into the lungs every 6 (six) hours as needed.        [name] aspirin 81 MG tablet Take 81 mg by mouth daily.        . beclomethasone (QVAR) 80 MCG/ACT inhaler Inhale 2 puffs into the lungs daily.        [name] desloratadine (CLARINEX) 5 MG tablet Take 5 mg by mouth daily.        . diazepam (VALIUM) 5 MG tablet Take 1 tablet (5 mg total) by mouth at bedtime as needed.  30 tablet  3  . Eszopiclone (ESZOPICLONE) 3 MG TABS Take 1 tablet (3 mg total) by mouth at bedtime. Take immediately before bedtime  30 tablet  1  . fluticasone (FLONASE) 50 MCG/ACT nasal spray 2 SPRAY EACH NOSTRIL ONCE DAILY  16 g  3  . folic acid (FOLVITE) 1 MG tablet Take 1 mg by  mouth daily.        [name] guaiFENesin (MUCINEX) 600 MG 12 hr tablet Take 1 tablet (600 mg total) by mouth 2 (two) times daily.  20 tablet  0  . LAC-HYDRIN FIVE 5 % LOTN lotion APPLY TO AFFECTED AREA(S) TWICE DAILY  222 mL  3  . nitroGLYCERIN (NITROSTAT) 0.4 MG SL tablet Place 0.4 mg under the tongue every 5 (five) minutes as needed.        [name] olmesartan-hydrochlorothiazide (BENICAR HCT) 20-12.5 MG per tablet Take 1 tablet by mouth daily.  30 tablet  11  . omeprazole (PRILOSEC) 20 MG capsule Take 20 mg by mouth daily.       . polyethylene glycol (MIRALAX / GLYCOLAX) packet Take 17 g by mouth daily.        . Probiotic Product (ALIGN) 4 MG CAPS Take 1 tablet by mouth daily.       . rosuvastatin (CRESTOR) 10 MG tablet Take 10 mg by mouth daily.        . vitamin B-12 (CYANOCOBALAMIN) 100 MCG tablet Take 50 mcg by mouth daily.        . Wheat Dextrin (BENEFIBER DRINK MIX) PACK Take by mouth. Mix powder in water       . DISCONTD: Eszopiclone (ESZOPICLONE) 3 MG TABS Take 3 mg by mouth as needed. Take immediately before bedtime        BP 140/80  Pulse 72  Temp 98.6 F (37 C)  Resp 16  Ht 5' 5.5" (1.664 m)  Wt 186 lb (84.369 kg)  BMI 30.48 kg/m2       Objective:   Physical Exam  Nursing note and vitals reviewed. Constitutional: He appears well-developed and well-nourished.  HENT:  Head: Normocephalic and atraumatic.  Eyes: Conjunctivae are normal. Pupils are equal, round, and reactive to light.  Neck: Normal range of motion. Neck supple.  Cardiovascular: Normal rate and regular rhythm.   Pulmonary/Chest: Effort normal and breath sounds normal.  Abdominal: Soft. Bowel sounds are normal.  Musculoskeletal:       Positive leg lift on the left tenderness in the low back muscles          Assessment & Plan:  Acute low back pain this was probably radicular in nature suggesting possible acute nerve impingement.  We will place him on a Medrol Dosepak muscle relaxant 3 times a day back exercises  and tramadol for pain. If pain persists the next up would be an  MRI of the back Patient's blood pressure and other chronic problems are stable

## 2011-10-12 ENCOUNTER — Ambulatory Visit: Payer: Medicare Other | Admitting: Internal Medicine

## 2011-10-16 DIAGNOSIS — M5137 Other intervertebral disc degeneration, lumbosacral region: Secondary | ICD-10-CM | POA: Diagnosis not present

## 2011-10-16 DIAGNOSIS — M999 Biomechanical lesion, unspecified: Secondary | ICD-10-CM | POA: Diagnosis not present

## 2011-10-16 DIAGNOSIS — M5430 Sciatica, unspecified side: Secondary | ICD-10-CM | POA: Diagnosis not present

## 2011-10-19 ENCOUNTER — Other Ambulatory Visit (HOSPITAL_COMMUNITY): Payer: Self-pay | Admitting: Chiropractic Medicine

## 2011-10-19 ENCOUNTER — Ambulatory Visit (HOSPITAL_COMMUNITY)
Admission: RE | Admit: 2011-10-19 | Discharge: 2011-10-19 | Disposition: A | Discharge status: Home / Self Care | Payer: Medicare Other | Source: Ambulatory Visit | Attending: Chiropractic Medicine | Admitting: Chiropractic Medicine

## 2011-10-19 DIAGNOSIS — M47817 Spondylosis without myelopathy or radiculopathy, lumbosacral region: Secondary | ICD-10-CM | POA: Diagnosis not present

## 2011-10-19 DIAGNOSIS — M545 Low back pain, unspecified: Secondary | ICD-10-CM | POA: Diagnosis not present

## 2011-10-19 DIAGNOSIS — M999 Biomechanical lesion, unspecified: Secondary | ICD-10-CM | POA: Diagnosis not present

## 2011-10-19 DIAGNOSIS — M5430 Sciatica, unspecified side: Secondary | ICD-10-CM | POA: Diagnosis not present

## 2011-10-19 DIAGNOSIS — I77811 Abdominal aortic ectasia: Secondary | ICD-10-CM | POA: Diagnosis not present

## 2011-10-19 DIAGNOSIS — M5137 Other intervertebral disc degeneration, lumbosacral region: Secondary | ICD-10-CM | POA: Diagnosis not present

## 2011-10-19 DIAGNOSIS — J309 Allergic rhinitis, unspecified: Secondary | ICD-10-CM | POA: Diagnosis not present

## 2011-10-23 DIAGNOSIS — M5430 Sciatica, unspecified side: Secondary | ICD-10-CM | POA: Diagnosis not present

## 2011-10-23 DIAGNOSIS — M5137 Other intervertebral disc degeneration, lumbosacral region: Secondary | ICD-10-CM | POA: Diagnosis not present

## 2011-10-23 DIAGNOSIS — M999 Biomechanical lesion, unspecified: Secondary | ICD-10-CM | POA: Diagnosis not present

## 2011-10-28 DIAGNOSIS — M999 Biomechanical lesion, unspecified: Secondary | ICD-10-CM | POA: Diagnosis not present

## 2011-10-28 DIAGNOSIS — M5137 Other intervertebral disc degeneration, lumbosacral region: Secondary | ICD-10-CM | POA: Diagnosis not present

## 2011-10-28 DIAGNOSIS — M5430 Sciatica, unspecified side: Secondary | ICD-10-CM | POA: Diagnosis not present

## 2011-10-29 ENCOUNTER — Other Ambulatory Visit: Payer: Self-pay | Admitting: Internal Medicine

## 2011-10-30 DIAGNOSIS — M5430 Sciatica, unspecified side: Secondary | ICD-10-CM | POA: Diagnosis not present

## 2011-10-30 DIAGNOSIS — M999 Biomechanical lesion, unspecified: Secondary | ICD-10-CM | POA: Diagnosis not present

## 2011-10-30 DIAGNOSIS — M5137 Other intervertebral disc degeneration, lumbosacral region: Secondary | ICD-10-CM | POA: Diagnosis not present

## 2011-11-02 DIAGNOSIS — J309 Allergic rhinitis, unspecified: Secondary | ICD-10-CM | POA: Diagnosis not present

## 2011-11-04 DIAGNOSIS — M999 Biomechanical lesion, unspecified: Secondary | ICD-10-CM | POA: Diagnosis not present

## 2011-11-04 DIAGNOSIS — M5137 Other intervertebral disc degeneration, lumbosacral region: Secondary | ICD-10-CM | POA: Diagnosis not present

## 2011-11-04 DIAGNOSIS — M5430 Sciatica, unspecified side: Secondary | ICD-10-CM | POA: Diagnosis not present

## 2011-11-06 DIAGNOSIS — J309 Allergic rhinitis, unspecified: Secondary | ICD-10-CM | POA: Diagnosis not present

## 2011-11-09 DIAGNOSIS — J309 Allergic rhinitis, unspecified: Secondary | ICD-10-CM | POA: Diagnosis not present

## 2011-11-13 DIAGNOSIS — J309 Allergic rhinitis, unspecified: Secondary | ICD-10-CM | POA: Diagnosis not present

## 2011-11-16 DIAGNOSIS — J309 Allergic rhinitis, unspecified: Secondary | ICD-10-CM | POA: Diagnosis not present

## 2011-11-18 DIAGNOSIS — Z8546 Personal history of malignant neoplasm of prostate: Secondary | ICD-10-CM | POA: Diagnosis not present

## 2011-11-20 ENCOUNTER — Other Ambulatory Visit: Payer: Self-pay | Admitting: *Deleted

## 2011-11-20 DIAGNOSIS — J309 Allergic rhinitis, unspecified: Secondary | ICD-10-CM | POA: Diagnosis not present

## 2011-11-20 MED ORDER — BECLOMETHASONE DIPROPIONATE 80 MCG/ACT IN AERS: 2.0000 | INHALATION_SPRAY | Freq: Every day | RESPIRATORY_TRACT | Status: DC

## 2011-11-24 DIAGNOSIS — J309 Allergic rhinitis, unspecified: Secondary | ICD-10-CM | POA: Diagnosis not present

## 2011-11-25 DIAGNOSIS — N393 Stress incontinence (female) (male): Secondary | ICD-10-CM | POA: Diagnosis not present

## 2011-11-25 DIAGNOSIS — N529 Male erectile dysfunction, unspecified: Secondary | ICD-10-CM | POA: Diagnosis not present

## 2011-11-25 DIAGNOSIS — Z8546 Personal history of malignant neoplasm of prostate: Secondary | ICD-10-CM | POA: Diagnosis not present

## 2011-11-26 ENCOUNTER — Telehealth: Payer: Self-pay | Admitting: Internal Medicine

## 2011-11-26 NOTE — Telephone Encounter (Signed)
Called pt to make aware that Dr. Lovell Sheehan is out of the office until 11/30/11. Pls advise.

## 2011-11-26 NOTE — Telephone Encounter (Signed)
Caller: [name]/Patient; Patient Name: Joshua [name]; PCP: Darryll Capers (Adults only); Best Callback Phone Number: [phone].  Call regarding Diminshed Hearing Left Ear, onset 2 weeks.  Patient requesting referral to Dr Beckie Busing has history of Tinnitus was referred to Dr Beckie Busing by Dr Lovell Sheehan in the past.  All emergent symptoms ruled out per Hearing Change Protocol, see in 72 hours due to gradual hearing loss.  PLEASE FOLLOW UP WITH PATIENT WITH REFERRAL INFO.

## 2011-11-30 ENCOUNTER — Other Ambulatory Visit: Payer: Self-pay | Admitting: Internal Medicine

## 2011-11-30 DIAGNOSIS — H9190 Unspecified hearing loss, unspecified ear: Secondary | ICD-10-CM

## 2011-11-30 NOTE — Telephone Encounter (Signed)
To dr Beckie Busing at [facility] on [address]

## 2011-11-30 NOTE — Telephone Encounter (Signed)
MAY REFER PER PATIENTS REQUEST

## 2011-12-01 ENCOUNTER — Telehealth: Payer: Self-pay | Admitting: Internal Medicine

## 2011-12-01 NOTE — Telephone Encounter (Signed)
Pt called and said that he is returning call from [facility] and is req call back. Pt believes that this was re: getting a referral to Dr. Mauricio Po. Pt said that he can make his own appt with Dr Beckie Busing at [facility] on [address] in [place], but pt has to have referral first. Pt said that Dr Lovell Sheehan was the one that had originally referred the pt to Dr Beckie Busing a couple of years ago.

## 2011-12-01 NOTE — Telephone Encounter (Signed)
Pt informed referral was sent to terri yesterday

## 2011-12-03 DIAGNOSIS — H9319 Tinnitus, unspecified ear: Secondary | ICD-10-CM | POA: Diagnosis not present

## 2011-12-03 DIAGNOSIS — H903 Sensorineural hearing loss, bilateral: Secondary | ICD-10-CM | POA: Diagnosis not present

## 2011-12-06 ENCOUNTER — Other Ambulatory Visit: Payer: Self-pay | Admitting: Internal Medicine

## 2011-12-07 ENCOUNTER — Ambulatory Visit: Payer: Medicare Other | Admitting: Internal Medicine

## 2011-12-08 ENCOUNTER — Encounter: Payer: Self-pay | Admitting: Internal Medicine

## 2011-12-08 ENCOUNTER — Ambulatory Visit (INDEPENDENT_AMBULATORY_CARE_PROVIDER_SITE_OTHER): Payer: Medicare Other | Admitting: Internal Medicine

## 2011-12-08 VITALS — BP 136/78 | HR 72 | Temp 98.1°F | Resp 16 | Ht 65.5 in | Wt 188.0 lb

## 2011-12-08 DIAGNOSIS — J020 Streptococcal pharyngitis: Secondary | ICD-10-CM | POA: Diagnosis not present

## 2011-12-08 MED ORDER — AMOXICILLIN-POT CLAVULANATE 875-125 MG PO TABS: 1.0000 | ORAL_TABLET | Freq: Two times a day (BID) | ORAL | Status: DC

## 2011-12-08 NOTE — Patient Instructions (Signed)
Complete full course of antibiotic

## 2011-12-08 NOTE — Progress Notes (Signed)
Subjective:    Patient ID: Joshua [name], male    DOB: 1940/08/08, 71 y.o.   MRN: [id number]  HPI  Has severe sore throat and adenopathy Patient has a several week history of upper respiratory tract infection with sinus pressure sore throat and now adenopathy.  He appears to be moderately diaphoretic but has no fever today.    Review of Systems  Constitutional: Negative for fever and fatigue.  HENT: Positive for ear pain, nosebleeds, congestion and postnasal drip. Negative for hearing loss and neck pain.   Eyes: Negative.  Negative for discharge, redness and visual disturbance.  Respiratory: Negative.  Negative for cough, shortness of breath and wheezing.   Cardiovascular: Negative.  Negative for leg swelling.  Gastrointestinal: Negative.  Negative for abdominal pain, constipation and abdominal distention.  Genitourinary: Negative for urgency and frequency.  Musculoskeletal: Negative for joint swelling and arthralgias.  Skin: Negative for color change and rash.  Neurological: Negative.  Negative for weakness and light-headedness.  Hematological: Negative for adenopathy.  Psychiatric/Behavioral: Negative for behavioral problems.   Past Medical History  Diagnosis Date  . Asthma   . CAD (coronary artery disease)   . Depression   . GERD (gastroesophageal reflux disease)   . Hyperlipidemia   . IBS (irritable bowel syndrome)   . Acute medial meniscal tear   . DJD (degenerative joint disease)   . Hypertension   . Anemia   . Diverticulosis   . Obesity   . Pancreatitis   . PUD (peptic ulcer disease)   . Pneumonia   . Sleep apnea   . Prostate cancer   . Acute sinusitis 12/14/2006    Qualifier: Diagnosis of  By: Lovell Sheehan MD, Balinda Quails     History   Social History  . Marital Status: Married    Spouse Name: N/A    Number of Children: N/A  . Years of Education: N/A   Occupational History  . retired    Social History Main Topics  . Smoking status: Former Smoker    Quit  date: 06/16/1992  . Smokeless tobacco: Not on file   Comment: smoked 1ppd for 35 years  . Alcohol Use: No  . Drug Use: No  . Sexually Active: Yes   Other Topics Concern  . Not on file   Social History Narrative  . No narrative on file    Past Surgical History  Procedure Date  . Thyroidectomy, partial   . Coronary artery bypass graft   . Cholecystectomy   . Cervical fusion   . Cervical laminectomy 05/26/06  . Arthroscopy rt knee 04/2008    [email or web address]    Family History  Problem Relation Age of Onset  . Heart disease Father     Allergies  Allergen Reactions  . Bromfed     REACTION: n\T\v  . Clarithromycin     REACTION: gastritis  . Codeine   . Doxycycline Hyclate     REACTION: n\T\v  . Erythromycin Base     REACTION: rash  . Hydrocodone-Acetaminophen     REACTION: rash  . Modafinil     REACTION: anxiety-nervousness  . Promethazine Hcl     REACTION: fainting  . Sulfonamide Derivatives     REACTION: rash  . Telithromycin     REACTION: nausea    Current Outpatient Prescriptions on File Prior to Visit  Medication Sig Dispense Refill  . albuterol (PROAIR HFA) 108 (90 BASE) MCG/ACT inhaler Inhale 2 puffs into the lungs every 6 (six) hours as needed.        [name]  aspirin 81 MG tablet Take 81 mg by mouth daily.        . beclomethasone (QVAR) 80 MCG/ACT inhaler Inhale 2 puffs into the lungs daily.  1 Inhaler  6  . desloratadine (CLARINEX) 5 MG tablet Take 5 mg by mouth daily.        . diazepam (VALIUM) 5 MG tablet Take 1 tablet (5 mg total) by mouth at bedtime as needed.  30 tablet  3  . fluticasone (FLONASE) 50 MCG/ACT nasal spray 2 SPRAY EACH NOSTRIL ONCE DAILY  16 g  3  . folic acid (FOLVITE) 1 MG tablet Take 1 mg by mouth daily.        [name] guaiFENesin (MUCINEX) 600 MG 12 hr tablet Take 1 tablet (600 mg total) by mouth 2 (two) times daily.  20 tablet  0  . LAC-HYDRIN FIVE 5 % LOTN lotion APPLY TO AFFECTED AREA(S) TWICE DAILY  222 mL  3  . LAC-HYDRIN FIVE 5 % LOTN lotion  APPLY TO AFFECTED AREA(S) TWICE DAILY  120 mL  2  . nitroGLYCERIN (NITROSTAT) 0.4 MG SL tablet Place 0.4 mg under the tongue every 5 (five) minutes as needed.        [name] olmesartan-hydrochlorothiazide (BENICAR HCT) 20-12.5 MG per tablet Take 1 tablet by mouth daily.  30 tablet  11  . omeprazole (PRILOSEC) 20 MG capsule Take 20 mg by mouth daily.       [name] omeprazole (PRILOSEC) 20 MG capsule TAKE 1 CAPSULE BY MOUTH TWICE A DAY WITH SAMPLES AS DIRECTED  60 capsule  5  . polyethylene glycol (MIRALAX / GLYCOLAX) packet Take 17 g by mouth daily.        . Probiotic Product (ALIGN) 4 MG CAPS Take 1 tablet by mouth daily.       . rosuvastatin (CRESTOR) 10 MG tablet Take 10 mg by mouth daily.        . vitamin B-12 (CYANOCOBALAMIN) 100 MCG tablet Take 50 mcg by mouth daily.        . Wheat Dextrin (BENEFIBER DRINK MIX) PACK Take by mouth. Mix powder in water       . DISCONTD: Eszopiclone (ESZOPICLONE) 3 MG TABS Take 1 tablet (3 mg total) by mouth at bedtime. Take immediately before bedtime  30 tablet  1    BP 136/78  Pulse 72  Temp 98.1 F (36.7 C)  Resp 16  Ht 5' 5.5" (1.664 m)  Wt 188 lb (85.276 kg)  BMI 30.81 kg/m2       Objective:   Physical Exam  On physical examination his vital signs are stable he is afebrile with a temperature of 98 1 he has a fullness in the cervical chain shotty adenopathy marked erythema of the posterior pharynx nasal passages are erythematous and swollen.        Assessment & Plan:  Possible strep throat.  He has an allergy to clarithromycin doxycycline site clean and erythromycin.  He is not allergic to the penicillins we will use Augmentin 875 twice a day 7 day protocol Salt water gargle first of throat Blood pressure and vital signs are stable no changes in current plan or medications Use of Augmentin discussed  I have spent more than 30 minutes examining this patient face-to-face of which over half was spent in counseling

## 2011-12-09 ENCOUNTER — Telehealth: Payer: Self-pay | Admitting: Internal Medicine

## 2011-12-09 NOTE — Telephone Encounter (Signed)
Per dr Lovell Sheehan- ok to take for about 1 -2 weeks

## 2011-12-09 NOTE — Telephone Encounter (Signed)
Caller: Joshua [name]/Patient; Patient Name: Joshua [name]; PCP: Darryll Capers (Adults only); [facility] Callback Phone Number: [phone]; Call regarding Strep Throat follow up, diagnosed on 9-17.  Patient has Sneezing and Cough, onset 9-18. Afebrile.   Patient wants to know if Mucinex DM is safe to take with High Blood Pressure.   Cough Protocol used, see in 4 hours due to new onset of Cough with Cardiac history, Cough onset after MD eval.  Please follow up with MD if Patient may take Mucinex DM with high blood pressure.  Patient doesn't feel he needs to come back for re-evaluation since he was there on 9-17.

## 2011-12-14 DIAGNOSIS — J309 Allergic rhinitis, unspecified: Secondary | ICD-10-CM | POA: Diagnosis not present

## 2011-12-20 ENCOUNTER — Encounter: Payer: Self-pay | Admitting: Internal Medicine

## 2011-12-28 ENCOUNTER — Telehealth: Payer: Self-pay | Admitting: Internal Medicine

## 2011-12-28 DIAGNOSIS — J309 Allergic rhinitis, unspecified: Secondary | ICD-10-CM | POA: Diagnosis not present

## 2011-12-28 NOTE — Telephone Encounter (Signed)
Caller: [name]/Patient; Patient Name: Joshua [name]; PCP: Darryll Capers (Adults only); [facility] Call[facility] Phone Number: [phone].  Pt returning a call. Pt received a message about getting an Abdominal Scan scheduled. Pt states he had one 2 years ago and is not sure why he needs to obtain another one. Pt states he being followed in regard to his Prostate Cancer/Prostatectomy 04/2010.  RN unable to locate info in regard to call.  PLEASE FOLLOW UP with PT  IN REGARD TO IMAGING REQUEST.  Thank you.

## 2011-12-28 NOTE — Telephone Encounter (Signed)
Talked with pt and we do not know who called --he is not due any ct

## 2011-12-28 NOTE — Telephone Encounter (Signed)
ok 

## 2011-12-31 DIAGNOSIS — J3081 Allergic rhinitis due to animal (cat) (dog) hair and dander: Secondary | ICD-10-CM | POA: Diagnosis not present

## 2011-12-31 DIAGNOSIS — J301 Allergic rhinitis due to pollen: Secondary | ICD-10-CM | POA: Diagnosis not present

## 2011-12-31 DIAGNOSIS — J3089 Other allergic rhinitis: Secondary | ICD-10-CM | POA: Diagnosis not present

## 2011-12-31 DIAGNOSIS — J45909 Unspecified asthma, uncomplicated: Secondary | ICD-10-CM | POA: Diagnosis not present

## 2012-01-01 NOTE — Progress Notes (Signed)
  Subjective:    Patient ID: Joshua [name], male    DOB: [birth year], 71 y.o.   MRN: [id number]  HPI    Review of Systems     Objective:   Physical Exam        Assessment & Plan:  blood pressure check no charge

## 2012-01-08 ENCOUNTER — Ambulatory Visit (INDEPENDENT_AMBULATORY_CARE_PROVIDER_SITE_OTHER): Payer: Medicare Other | Admitting: *Deleted

## 2012-01-08 ENCOUNTER — Ambulatory Visit (INDEPENDENT_AMBULATORY_CARE_PROVIDER_SITE_OTHER): Payer: Medicare Other

## 2012-01-08 DIAGNOSIS — Z23 Encounter for immunization: Secondary | ICD-10-CM | POA: Diagnosis not present

## 2012-01-11 DIAGNOSIS — J309 Allergic rhinitis, unspecified: Secondary | ICD-10-CM | POA: Diagnosis not present

## 2012-01-18 ENCOUNTER — Other Ambulatory Visit: Payer: Self-pay | Admitting: Internal Medicine

## 2012-01-25 DIAGNOSIS — J309 Allergic rhinitis, unspecified: Secondary | ICD-10-CM | POA: Diagnosis not present

## 2012-02-01 DIAGNOSIS — J309 Allergic rhinitis, unspecified: Secondary | ICD-10-CM | POA: Diagnosis not present

## 2012-02-15 DIAGNOSIS — J309 Allergic rhinitis, unspecified: Secondary | ICD-10-CM | POA: Diagnosis not present

## 2012-02-26 ENCOUNTER — Telehealth: Payer: Self-pay | Admitting: Internal Medicine

## 2012-02-26 NOTE — Telephone Encounter (Signed)
Pt called req to get samples of olmesartan-hydrochlorothiazide (BENICAR HCT) 20-12.5 MG per tablet °

## 2012-02-26 NOTE — Telephone Encounter (Signed)
Ready for pick up

## 2012-02-29 DIAGNOSIS — J309 Allergic rhinitis, unspecified: Secondary | ICD-10-CM | POA: Diagnosis not present

## 2012-03-09 ENCOUNTER — Encounter: Payer: Medicare Other | Admitting: Internal Medicine

## 2012-03-14 DIAGNOSIS — J309 Allergic rhinitis, unspecified: Secondary | ICD-10-CM | POA: Diagnosis not present

## 2012-03-28 DIAGNOSIS — J309 Allergic rhinitis, unspecified: Secondary | ICD-10-CM | POA: Diagnosis not present

## 2012-03-30 ENCOUNTER — Encounter: Payer: Medicare Other | Admitting: Internal Medicine

## 2012-04-06 ENCOUNTER — Ambulatory Visit (INDEPENDENT_AMBULATORY_CARE_PROVIDER_SITE_OTHER): Payer: Medicare Other | Admitting: Internal Medicine

## 2012-04-06 ENCOUNTER — Encounter: Payer: Self-pay | Admitting: Internal Medicine

## 2012-04-06 VITALS — BP 120/80 | HR 72 | Temp 98.2°F | Resp 16 | Ht 65.5 in | Wt 190.0 lb

## 2012-04-06 DIAGNOSIS — T887XXA Unspecified adverse effect of drug or medicament, initial encounter: Secondary | ICD-10-CM

## 2012-04-06 DIAGNOSIS — R5381 Other malaise: Secondary | ICD-10-CM

## 2012-04-06 DIAGNOSIS — R7309 Other abnormal glucose: Secondary | ICD-10-CM

## 2012-04-06 DIAGNOSIS — Z0000 Encounter for general adult medical examination without abnormal findings: Secondary | ICD-10-CM | POA: Diagnosis not present

## 2012-04-06 DIAGNOSIS — IMO0002 Reserved for concepts with insufficient information to code with codable children: Secondary | ICD-10-CM

## 2012-04-06 DIAGNOSIS — E785 Hyperlipidemia, unspecified: Secondary | ICD-10-CM | POA: Diagnosis not present

## 2012-04-06 DIAGNOSIS — R5383 Other fatigue: Secondary | ICD-10-CM

## 2012-04-06 DIAGNOSIS — R413 Other amnesia: Secondary | ICD-10-CM | POA: Diagnosis not present

## 2012-04-06 DIAGNOSIS — Z8546 Personal history of malignant neoplasm of prostate: Secondary | ICD-10-CM | POA: Diagnosis not present

## 2012-04-06 DIAGNOSIS — R739 Hyperglycemia, unspecified: Secondary | ICD-10-CM

## 2012-04-06 DIAGNOSIS — I10 Essential (primary) hypertension: Secondary | ICD-10-CM | POA: Diagnosis not present

## 2012-04-06 DIAGNOSIS — M792 Neuralgia and neuritis, unspecified: Secondary | ICD-10-CM

## 2012-04-06 LAB — POCT URINALYSIS DIPSTICK
Bilirubin, UA: NEGATIVE
Blood, UA: NEGATIVE
Glucose, UA: NEGATIVE
Ketones, UA: NEGATIVE
Leukocytes, UA: NEGATIVE
Nitrite, UA: NEGATIVE
Protein, UA: NEGATIVE
Spec Grav, UA: 1.015
Urobilinogen, UA: 0.2
pH, UA: 7

## 2012-04-06 LAB — CBC WITH DIFFERENTIAL/PLATELET
Basophils Absolute: 0 10*3/uL (ref 0.0–0.1)
Basophils Relative: 0.4 % (ref 0.0–3.0)
Eosinophils Absolute: 0.2 10*3/uL (ref 0.0–0.7)
Eosinophils Relative: 3.3 % (ref 0.0–5.0)
HCT: 45.6 % (ref 39.0–52.0)
Hemoglobin: 15.8 g/dL (ref 13.0–17.0)
Lymphocytes Relative: 18.6 % (ref 12.0–46.0)
Lymphs Abs: 1.2 10*3/uL (ref 0.7–4.0)
MCHC: 34.6 g/dL (ref 30.0–36.0)
MCV: 90.7 fl (ref 78.0–100.0)
Monocytes Absolute: 0.7 10*3/uL (ref 0.1–1.0)
Monocytes Relative: 10.3 % (ref 3.0–12.0)
Neutro Abs: 4.4 10*3/uL (ref 1.4–7.7)
Neutrophils Relative %: 67.4 % (ref 43.0–77.0)
Platelets: 229 10*3/uL (ref 150.0–400.0)
RBC: 5.03 Mil/uL (ref 4.22–5.81)
RDW: 12.6 % (ref 11.5–14.6)
WBC: 6.5 10*3/uL (ref 4.5–10.5)

## 2012-04-06 LAB — HEPATIC FUNCTION PANEL
ALT: 33 U/L (ref 0–53)
AST: 32 U/L (ref 0–37)
Albumin: 4.3 g/dL (ref 3.5–5.2)
Alkaline Phosphatase: 59 U/L (ref 39–117)
Bilirubin, Direct: 0.2 mg/dL (ref 0.0–0.3)
Total Bilirubin: 1.2 mg/dL (ref 0.3–1.2)
Total Protein: 7.2 g/dL (ref 6.0–8.3)

## 2012-04-06 LAB — BASIC METABOLIC PANEL
BUN: 20 mg/dL (ref 6–23)
CO2: 29 mEq/L (ref 19–32)
Calcium: 9.5 mg/dL (ref 8.4–10.5)
Chloride: 103 mEq/L (ref 96–112)
Creatinine, Ser: 1.5 mg/dL (ref 0.4–1.5)
GFR: 48.55 mL/min — ABNORMAL LOW (ref >60.00)
Glucose, Bld: 99 mg/dL (ref 70–99)
Potassium: 5.1 mEq/L (ref 3.5–5.1)
Sodium: 138 mEq/L (ref 135–145)

## 2012-04-06 LAB — LIPID PANEL
Cholesterol: 145 mg/dL (ref 0–200)
HDL: 46.2 mg/dL (ref >39.00)
LDL Cholesterol: 77 mg/dL (ref 0–99)
Total CHOL/HDL Ratio: 3
Triglycerides: 111 mg/dL (ref 0.0–149.0)
VLDL: 22.2 mg/dL (ref 0.0–40.0)

## 2012-04-06 LAB — PSA: PSA: 0 ng/mL — ABNORMAL LOW (ref 0.10–4.00)

## 2012-04-06 LAB — VITAMIN B12: Vitamin B-12: 1298 pg/mL — ABNORMAL HIGH (ref 211–911)

## 2012-04-06 LAB — HEMOGLOBIN A1C: Hgb A1c MFr Bld: 5.6 % (ref 4.6–6.5)

## 2012-04-06 LAB — TSH: TSH: 1.28 u[IU]/mL (ref 0.35–5.50)

## 2012-04-06 NOTE — Patient Instructions (Signed)
We are screening him for B12 deficiency as well as diabetes to explain the painful peripheral neuropathy

## 2012-04-06 NOTE — Progress Notes (Signed)
Subjective:    Patient ID: Joshua [name], male    DOB: 1940-11-16, 72 y.o.   MRN: [id number]  HPI Presents for Medicare wellness examination for chronic problems which include asthma gastroesophageal reflux hyperlipidemia hypertension and irritable bowel syndrome.  He has had in the past memory loss and we have monitored B12 levels He also has a history of progressive neuropathy   Review of Systems  Constitutional: Negative for fever and fatigue.  HENT: Negative for hearing loss, congestion, neck pain and postnasal drip.   Eyes: Negative for discharge, redness and visual disturbance.  Respiratory: Negative for cough, shortness of breath and wheezing.   Cardiovascular: Negative for leg swelling.  Gastrointestinal: Negative for abdominal pain, constipation and abdominal distention.  Genitourinary: Negative for urgency and frequency.  Musculoskeletal: Negative for joint swelling and arthralgias.  Skin: Negative for color change and rash.  Neurological: Negative for weakness and light-headedness.  Hematological: Negative for adenopathy.  Psychiatric/Behavioral: Negative for behavioral problems.       Objective:   Physical Exam  Nursing note and vitals reviewed. Constitutional: He is oriented to person, place, and time. He appears well-developed and well-nourished.  HENT:  Head: Normocephalic and atraumatic.  Eyes: Conjunctivae normal are normal. Pupils are equal, round, and reactive to light.  Neck: Normal range of motion. Neck supple.  Cardiovascular: Normal rate and regular rhythm.   Murmur heard. Pulmonary/Chest: Effort normal and breath sounds normal.  Abdominal: Soft. Bowel sounds are normal.  Genitourinary:       Prostate is surgically absent  Musculoskeletal: He exhibits tenderness. He exhibits no edema.  Neurological: He is alert and oriented to person, place, and time.       Peripheral neuropathy  Skin: Skin is warm and dry.          Assessment & Plan:    Patient's active problems include hypertension mild COPD history of hyperlipidemia history of gastroesophageal reflux and a history of coronary artery disease.   Is as risk factors are currently well controlled with pressure is excellent shortness of breath is stable and today we will screen for cholesterol and thyroid. Progressive neuropathy we will screen for both diabetes and B12 deficiency   Subjective:    Joshua [name] is a 72 y.o. male who presents for Medicare Annual/Subsequent preventive examination.   Preventive Screening-Counseling & Management  Tobacco History  Smoking status  . Former Smoker  . Quit date: 06/16/1992  Smokeless tobacco  . Not on file    Comment: smoked 1ppd for 35 years    Problems Prior to Visit 1.   Current Problems (verified) Patient Active Problem List  Diagnosis  . HYPERLIPIDEMIA  . ANEMIA, IRON DEFICIENCY  . DEPRESSION  . TINNITUS, CHRONIC, BILATERAL  . HYPERTENSION  . CORONARY ARTERY DISEASE  . ACUTE FRONTAL SINUSITIS  . Acute sinusitis  . ASTHMA  . GERD  . GASTROENTERITIS  . CONSTIPATION  . IRRITABLE BOWEL SYNDROME  . LOC OSTEOARTHROS NOT SPEC PRIM/SEC PELV RGN&THI  . OSTEOARTHRITIS, LOWER LEG, LEFT  . OSTEOARTHROSIS NOS, UNSPECIFIED SITE  . BACK PAIN  . MEDIAL EPICONDYLITIS, LEFT  . SYMPTOM, HYPERSOMNIA W/SLEEP APNEA NOS  . Other malaise and fatigue  . SYMPTOM, MEMORY LOSS  . SHORTNESS OF BREATH (SOB)  . CHEST PAIN-UNSPECIFIED  . PULMONARY CONGESTION  . ABDOMINAL PAIN, EPIGASTRIC  . ABDOMINAL PAIN -GENERALIZED  . MEDIAL MENISCUS TEAR  . TOBACCO ABUSE, HX OF  . ADENOCARCINOMA, PROSTATE, GLEASON GRADE 6  . Low back pain radiating to  left leg    Medications Prior to Visit Current Outpatient Prescriptions on File Prior to Visit  Medication Sig Dispense Refill  . albuterol (PROAIR HFA) 108 (90 BASE) MCG/ACT inhaler Inhale 2 puffs into the lungs every 6 (six) hours as needed.        [name] aspirin 81 MG tablet Take 81 mg  by mouth daily.        . beclomethasone (QVAR) 80 MCG/ACT inhaler Inhale 2 puffs into the lungs daily.  1 Inhaler  6  . CRESTOR 10 MG tablet TAKE ONE TABLET BY MOUTH NIGHTLY AT BEDTIME  30 tablet  10  . desloratadine (CLARINEX) 5 MG tablet Take 5 mg by mouth daily.        . diazepam (VALIUM) 5 MG tablet Take 1 tablet (5 mg total) by mouth at bedtime as needed.  30 tablet  3  . Eszopiclone 3 MG TABS Take 3 mg by mouth at bedtime as needed. Take immediately before bedtime      . fluticasone (FLONASE) 50 MCG/ACT nasal spray 2 SPRAY EACH NOSTRIL ONCE DAILY  16 g  3  . folic acid (FOLVITE) 1 MG tablet Take 1 mg by mouth daily.        . Ginger, Zingiber officinalis, (GINGER PO) Take 1 tablet by mouth 2 (two) times daily.      . Ginkgo Biloba 40 MG TABS Take 1 tablet by mouth 2 (two) times daily.      [name] guaiFENesin (MUCINEX) 600 MG 12 hr tablet Take 1 tablet (600 mg total) by mouth 2 (two) times daily.  20 tablet  0  . LAC-HYDRIN FIVE 5 % LOTN lotion APPLY TO AFFECTED AREA(S) TWICE DAILY  120 mL  2  . nitroGLYCERIN (NITROSTAT) 0.4 MG SL tablet Place 0.4 mg under the tongue every 5 (five) minutes as needed.        [name] olmesartan-hydrochlorothiazide (BENICAR HCT) 20-12.5 MG per tablet Take 1 tablet by mouth daily.  30 tablet  11  . omeprazole (PRILOSEC) 20 MG capsule Take 20 mg by mouth daily.       . polyethylene glycol (MIRALAX / GLYCOLAX) packet Take 17 g by mouth daily.        . Probiotic Product (ALIGN) 4 MG CAPS Take 1 tablet by mouth daily.       . vitamin B-12 (CYANOCOBALAMIN) 100 MCG tablet Take 50 mcg by mouth daily.        . Wheat Dextrin (BENEFIBER DRINK MIX) PACK Take by mouth. Mix powder in water         Current Medications (verified) Current Outpatient Prescriptions  Medication Sig Dispense Refill  . nystatin-triamcinolone ointment (MYCOLOG) Apply topically 2 (two) times daily.      [name] albuterol (PROAIR HFA) 108 (90 BASE) MCG/ACT inhaler Inhale 2 puffs into the lungs every 6 (six) hours  as needed.        [name] aspirin 81 MG tablet Take 81 mg by mouth daily.        . beclomethasone (QVAR) 80 MCG/ACT inhaler Inhale 2 puffs into the lungs daily.  1 Inhaler  6  . CRESTOR 10 MG tablet TAKE ONE TABLET BY MOUTH NIGHTLY AT BEDTIME  30 tablet  10  . desloratadine (CLARINEX) 5 MG tablet Take 5 mg by mouth daily.        . diazepam (VALIUM) 5 MG tablet Take 1 tablet (5 mg total) by mouth at bedtime as needed.  30 tablet  3  . Eszopiclone  3 MG TABS Take 3 mg by mouth at bedtime as needed. Take immediately before bedtime      . fluticasone (FLONASE) 50 MCG/ACT nasal spray 2 SPRAY EACH NOSTRIL ONCE DAILY  16 g  3  . folic acid (FOLVITE) 1 MG tablet Take 1 mg by mouth daily.        . Ginger, Zingiber officinalis, (GINGER PO) Take 1 tablet by mouth 2 (two) times daily.      . Ginkgo Biloba 40 MG TABS Take 1 tablet by mouth 2 (two) times daily.      [name] guaiFENesin (MUCINEX) 600 MG 12 hr tablet Take 1 tablet (600 mg total) by mouth 2 (two) times daily.  20 tablet  0  . LAC-HYDRIN FIVE 5 % LOTN lotion APPLY TO AFFECTED AREA(S) TWICE DAILY  120 mL  2  . nitroGLYCERIN (NITROSTAT) 0.4 MG SL tablet Place 0.4 mg under the tongue every 5 (five) minutes as needed.        [name] olmesartan-hydrochlorothiazide (BENICAR HCT) 20-12.5 MG per tablet Take 1 tablet by mouth daily.  30 tablet  11  . omeprazole (PRILOSEC) 20 MG capsule Take 20 mg by mouth daily.       . polyethylene glycol (MIRALAX / GLYCOLAX) packet Take 17 g by mouth daily.        . Probiotic Product (ALIGN) 4 MG CAPS Take 1 tablet by mouth daily.       . vitamin B-12 (CYANOCOBALAMIN) 100 MCG tablet Take 50 mcg by mouth daily.        . Wheat Dextrin (BENEFIBER DRINK MIX) PACK Take by mouth. Mix powder in water          Allergies (verified) Bromfed; Clarithromycin; Codeine; Doxycycline hyclate; Erythromycin base; Hydrocodone-acetaminophen; Modafinil; Promethazine hcl; Sulfonamide derivatives; and Telithromycin   PAST HISTORY  Family History Family  History  Problem Relation Age of Onset  . Heart disease Father     Social History History  Substance Use Topics  . Smoking status: Former Smoker    Quit date: 06/16/1992  . Smokeless tobacco: Not on file     Comment: smoked 1ppd for 35 years  . Alcohol Use: No    Are there smokers in your home (other than you)?  No  Risk Factors Current exercise habits: Gym/ health club routine includes cardio, light weights and stationary bike.  Dietary issues discussed: IBS   Cardiac risk factors: advanced age (older than 28 for men, 29 for women).  Depression Screen (Note: if answer to either of the following is "Yes", a more complete depression screening is indicated)   Q1: Over the past two weeks, have you felt down, depressed or hopeless? No  Q2: Over the past two weeks, have you felt little interest or pleasure in doing things? Yes  Have you lost interest or pleasure in daily life? No  Do you often feel hopeless? No  Do you cry easily over simple problems? No  Activities of Daily Living In your present state of health, do you have any difficulty performing the following activities?:  Driving? No Managing money?  No Feeding yourself? No Getting from bed to chair? No Climbing a flight of stairs? No Preparing food and eating?: No Bathing or showering? No Getting dressed: No Getting to the toilet? No Using the toilet:No Moving around from place to place: No In the past year have you fallen or had a near fall?:No   Are you sexually active?  Yes  Do you have more  than one partner?  No  Hearing Difficulties: No Do you often ask people to speak up or repeat themselves? No Do you experience ringing or noises in your ears? No Do you have difficulty understanding soft or whispered voices? No   Do you feel that you have a problem with memory? No  Do you often misplace items? No  Do you feel safe at home?  Yes  Cognitive Testing  Alert? Yes  Normal Appearance?Yes  Oriented to  person? Yes  Place? Yes   Time? Yes  Recall of three objects?  Yes  Can perform simple calculations? Yes  Displays appropriate judgment?Yes  Can read the correct time from a watch face?Yes   Advanced Directives have been discussed with the patient? Yes   List the Names of Other Physician/Practitioners you currently use: 1.    Indicate any recent Medical Services you may have received from other than [facility] providers in the past year (date may be approximate).  Immunization History  Administered Date(s) Administered  . Influenza Split 01/08/2012  . Influenza Whole 12/08/2007, 12/26/2008, 12/22/2010  . Pneumococcal Polysaccharide 06/04/2006  . Td 04/23/2005  . Zoster 01/08/2012    Screening Tests Health Maintenance  Topic Date Due  . Pneumococcal Polysaccharide Vaccine Age 62 And Over  02/13/2008  . Influenza Vaccine  11/21/2012  . Tetanus/tdap  04/24/2015  . Colonoscopy  12/13/2018  . Zostavax  Completed    All answers were reviewed with the patient and necessary referrals were made:  Carrie Mew, MD   04/06/2012   History reviewed: allergies, current medications, past family history, past medical history, past social history, past surgical history and problem list  Review of Systems Pertinent items are noted in HPI.    Objective:     Vision by Snellen chart: right eye:20/20, left eye:20/20 Blood pressure 120/80, pulse 72, temperature 98.2 F (36.8 C), resp. rate 16, height 5' 5.5" (1.664 m), weight 190 lb (86.183 kg). Body mass index is 31.14 kg/(m^2).  No exam performed today, exam in problemfocused section.     Assessment:      Patient presents for yearly preventative medicine examination.   all immunizations and health maintenance protocols were reviewed with the patient and they are up to date with these protocols.   screening laboratory values were reviewed with the patient including screening of hyperlipidemia PSA renal function and hepatic  function.   There medications past medical history social history problem list and allergies were reviewed in detail.   Goals were established with regard to weight loss exercise diet in compliance with medications      Plan:     During the course of the visit the patient was educated and counseled about appropriate screening and preventive services including:    Influenza vaccine  Prostate cancer screening  Diabetes screening  Diet review for nutrition referral? Yes ____  Not Indicated ____   Patient Instructions (the written plan) was given to the patient.  Medicare Attestation I have personally reviewed: The patient's medical and social history Their use of alcohol, tobacco or illicit drugs Their current medications and supplements The patient's functional ability including ADLs,fall risks, home safety risks, cognitive, and hearing and visual impairment Diet and physical activities Evidence for depression or mood disorders  The patient's weight, height, BMI, and visual acuity have been recorded in the chart.  I have made referrals, counseling, and provided education to the patient based on review of the above and I have provided the patient with  a written personalized care plan for preventive services.     Carrie Mew, MD   04/06/2012

## 2012-04-07 ENCOUNTER — Telehealth: Payer: Self-pay | Admitting: Internal Medicine

## 2012-04-07 NOTE — Telephone Encounter (Signed)
Pt would like to obtain blood work results on My chart. Please release results

## 2012-04-08 NOTE — Telephone Encounter (Signed)
pts wife advised it takes several days for that to go thru

## 2012-04-11 ENCOUNTER — Encounter: Payer: Self-pay | Admitting: Cardiology

## 2012-04-11 ENCOUNTER — Ambulatory Visit (INDEPENDENT_AMBULATORY_CARE_PROVIDER_SITE_OTHER): Payer: Medicare Other | Admitting: Cardiology

## 2012-04-11 VITALS — BP 144/86 | HR 78 | Ht 65.5 in | Wt 193.0 lb

## 2012-04-11 DIAGNOSIS — Z87891 Personal history of nicotine dependence: Secondary | ICD-10-CM | POA: Diagnosis not present

## 2012-04-11 DIAGNOSIS — I10 Essential (primary) hypertension: Secondary | ICD-10-CM

## 2012-04-11 DIAGNOSIS — I2510 Atherosclerotic heart disease of native coronary artery without angina pectoris: Secondary | ICD-10-CM | POA: Diagnosis not present

## 2012-04-11 DIAGNOSIS — J309 Allergic rhinitis, unspecified: Secondary | ICD-10-CM | POA: Diagnosis not present

## 2012-04-11 NOTE — Patient Instructions (Addendum)
The current medical regimen is effective;  continue present plan and medications.  Your physician has requested that you have an exercise tolerance test. For further information please visit [email or web address]. Please also follow instruction sheet, as given.  Follow up in 1 year with Dr Hochrein.  You will receive a letter in the mail 2 months before you are due.  Please call us when you receive this letter to schedule your follow up appointment.  

## 2012-04-11 NOTE — Progress Notes (Signed)
HPI  The patient returns for one year follow up.  Since I last saw him he has done well.  He is exercising routinely. He goes to the gym for 6-8 hours per week. With this level of activity he denies any chest discomfort, neck or arm discomfort. He does not report palpitations, presyncope or syncope. He does not have shortness of breath, PND or orthopnea. He's had no weight gain or edema. Unfortunately his only vice seems to be poor eating habits. We reviewed this.  Allergies  Allergen Reactions  . Bromfed     REACTION: n\T\v  . Clarithromycin     REACTION: gastritis  . Codeine   . Doxycycline Hyclate     REACTION: n\T\v  . Erythromycin Base     REACTION: rash  . Hydrocodone-Acetaminophen     REACTION: rash  . Modafinil     REACTION: anxiety-nervousness  . Promethazine Hcl     REACTION: fainting  . Sulfonamide Derivatives     REACTION: rash  . Telithromycin     REACTION: nausea    Current Outpatient Prescriptions  Medication Sig Dispense Refill  . albuterol (PROAIR HFA) 108 (90 BASE) MCG/ACT inhaler Inhale 2 puffs into the lungs every 6 (six) hours as needed.        [name] aspirin 81 MG tablet Take 81 mg by mouth daily.        . beclomethasone (QVAR) 80 MCG/ACT inhaler Inhale 2 puffs into the lungs daily.  1 Inhaler  6  . CRESTOR 10 MG tablet TAKE ONE TABLET BY MOUTH NIGHTLY AT BEDTIME  30 tablet  10  . desloratadine (CLARINEX) 5 MG tablet Take 5 mg by mouth daily.        . diazepam (VALIUM) 5 MG tablet Take 1 tablet (5 mg total) by mouth at bedtime as needed.  30 tablet  3  . Eszopiclone 3 MG TABS Take 3 mg by mouth at bedtime as needed. Take immediately before bedtime      . fluticasone (FLONASE) 50 MCG/ACT nasal spray 2 SPRAY EACH NOSTRIL ONCE DAILY  16 g  3  . folic acid (FOLVITE) 1 MG tablet Take 1 mg by mouth daily.        . Ginger, Zingiber officinalis, (GINGER PO) Take 1 tablet by mouth 2 (two) times daily.      . Ginkgo Biloba 40 MG TABS Take 1 tablet by mouth 2 (two)  times daily.      [name] guaiFENesin (MUCINEX) 600 MG 12 hr tablet Take 1 tablet (600 mg total) by mouth 2 (two) times daily.  20 tablet  0  . LAC-HYDRIN FIVE 5 % LOTN lotion APPLY TO AFFECTED AREA(S) TWICE DAILY  120 mL  2  . nitroGLYCERIN (NITROSTAT) 0.4 MG SL tablet Place 0.4 mg under the tongue every 5 (five) minutes as needed.        . nystatin-triamcinolone ointment (MYCOLOG) Apply topically 2 (two) times daily.      [name] olmesartan-hydrochlorothiazide (BENICAR HCT) 20-12.5 MG per tablet Take 1 tablet by mouth daily.  30 tablet  11  . omeprazole (PRILOSEC) 20 MG capsule Take 20 mg by mouth daily.       . polyethylene glycol (MIRALAX / GLYCOLAX) packet Take 17 g by mouth daily.        . Probiotic Product (ALIGN) 4 MG CAPS Take 1 tablet by mouth daily.       . vitamin B-12 (CYANOCOBALAMIN) 100 MCG tablet Take 50 mcg by mouth  daily.        . Wheat Dextrin (BENEFIBER DRINK MIX) PACK Take by mouth. Mix powder in water         Past Medical History  Diagnosis Date  . Asthma   . CAD (coronary artery disease)   . Depression   . GERD (gastroesophageal reflux disease)   . Hyperlipidemia   . IBS (irritable bowel syndrome)   . Acute medial meniscal tear   . DJD (degenerative joint disease)   . Hypertension   . Anemia   . Diverticulosis   . Obesity   . Pancreatitis   . PUD (peptic ulcer disease)   . Pneumonia   . Sleep apnea   . Prostate cancer   . Acute sinusitis 12/14/2006    Qualifier: Diagnosis of  By: Lovell Sheehan MD, Balinda Quails     Past Surgical History  Procedure Date  . Thyroidectomy, partial   . Coronary artery bypass graft   . Cholecystectomy   . Cervical fusion   . Cervical laminectomy 05/26/06  . Arthroscopy rt knee 04/2008    daldoerf    ROS: Pain in his toes (probable neuropathy) As stated in the HPI and negative for all other systems.   PHYSICAL EXAM BP 144/86  Pulse 78  Ht 5' 5.5" (1.664 m)  Wt 193 lb (87.544 kg)  BMI 31.63 kg/m2 GENERAL:  Well appearing HEENT:  Pupils  equal round and reactive, fundi not visualized, oral mucosa unremarkable NECK:  No jugular venous distention, waveform within normal limits, carotid upstroke brisk and symmetric, no bruits, no thyromegaly LYMPHATICS:  No cervical, inguinal adenopathy LUNGS:  Clear to auscultation bilaterally BACK:  No CVA tenderness CHEST:  Well healed sternotomy scar. HEART:  PMI not displaced or sustained,S1 and S2 within normal limits, no S3, no S4, no clicks, no rubs, no murmurs ABD:  Flat, positive bowel sounds normal in frequency in pitch, no bruits, no rebound, no guarding, no midline pulsatile mass, no hepatomegaly, no splenomegaly EXT:  2 plus pulses throughout, no edema, no cyanosis no clubbing SKIN:  No rashes no nodules NEURO:  Cranial nerves II through XII grossly intact, motor grossly intact throughout PSYCH:  Cognitively intact, oriented to person place and time  EKG:  Sinus rhythm, rate 78, axis within normal limits, poor anterior R wave progression, no acute ST-T wave changes, rightward axis.  04/11/2012  ASSESSMENT AND PLAN  CORONARY ARTERY DISEASE -  I will bring the patient back for a POET (Plain Old Exercise Test). This will allow me to screen for obstructive coronary disease, risk stratify and very importantly provide a prescription for exercise.  We discussed primary risk reduction. I reviewed recent blood work.   HYPERLIPIDEMIA -   He is not at target dose of statin per recent guidelines. However, his lipid profile is excellent. Therefore, I would not see a reason to change this.

## 2012-04-12 DIAGNOSIS — J309 Allergic rhinitis, unspecified: Secondary | ICD-10-CM | POA: Diagnosis not present

## 2012-04-14 ENCOUNTER — Other Ambulatory Visit: Payer: Self-pay | Admitting: Internal Medicine

## 2012-04-25 DIAGNOSIS — J309 Allergic rhinitis, unspecified: Secondary | ICD-10-CM | POA: Diagnosis not present

## 2012-05-02 ENCOUNTER — Encounter: Payer: Self-pay | Admitting: Nurse Practitioner

## 2012-05-02 ENCOUNTER — Ambulatory Visit (INDEPENDENT_AMBULATORY_CARE_PROVIDER_SITE_OTHER): Payer: Medicare Other | Admitting: Nurse Practitioner

## 2012-05-02 ENCOUNTER — Telehealth: Payer: Self-pay | Admitting: *Deleted

## 2012-05-02 VITALS — BP 127/93 | HR 65

## 2012-05-02 DIAGNOSIS — H2510 Age-related nuclear cataract, unspecified eye: Secondary | ICD-10-CM | POA: Diagnosis not present

## 2012-05-02 DIAGNOSIS — I2510 Atherosclerotic heart disease of native coronary artery without angina pectoris: Secondary | ICD-10-CM | POA: Diagnosis not present

## 2012-05-02 DIAGNOSIS — H52209 Unspecified astigmatism, unspecified eye: Secondary | ICD-10-CM | POA: Diagnosis not present

## 2012-05-02 NOTE — Telephone Encounter (Signed)
Pt left ov visit note here, I will put in the mail today 05/02/12

## 2012-05-02 NOTE — Progress Notes (Signed)
Exercise Treadmill Test  Pre-Exercise Testing Evaluation Rhythm: normal sinus  Rate: 65                 Test  Exercise Tolerance Test Ordering MD: Angelina Sheriff, MD  Interpreting MD: Norma Fredrickson, NP  Unique Test No: 1  Treadmill:  1  Indication for ETT: CAD  Contraindication to ETT: No   Stress Modality: exercise - treadmill  Cardiac Imaging Performed: non   Protocol: standard Bruce - maximal  Max BP:  232/91  Max MPHR (bpm):  151 85% MPR (bpm):  128  MPHR obtained (bpm):  148 % MPHR obtained:  98  Reached 85% MPHR (min:sec):  6:34 Total Exercise Time (min-sec):  9 minutes  Workload in METS:  9.8 Borg Scale: 17  Reason ETT Terminated:  desired heart rate attained    ST Segment Analysis At Rest: normal ST segments - no evidence of significant ST depression With Exercise: no evidence of significant ST depression  Other Information Arrhythmia:  Yes Angina during ETT:  absent (0) Quality of ETT:  diagnostic  ETT Interpretation:  normal - no evidence of ischemia by ST analysis  Comments: Patient presents today for routine GXT. Has known CAD with remote CABG. Exercises regularly and has no symptoms. He does  Endorse poor eating habits.   Today, he exercised on the standard Bruce protocol for a total of 9 minutes. Good exercise tolerance. Hypertensive BP response. Clinically negative for chest pain. EKG negative for ischemia but does have frequent PVC's.   Recommendations: CV risk factor modification Will check echo in light of the PVC's. He does endorse dizzy spells - no syncope.  See Dr. Antoine Poche back in a year unless echo is abnormal.   Patient is agreeable to this plan and will call if any problems develop in the interim.

## 2012-05-02 NOTE — Patient Instructions (Signed)
We are going to obtain an ultrasound of your heart  If this is ok, we will see you back in a year  Call the [facility] Care office at [phone] if you have any questions, problems or concerns.

## 2012-05-09 DIAGNOSIS — J309 Allergic rhinitis, unspecified: Secondary | ICD-10-CM | POA: Diagnosis not present

## 2012-05-10 ENCOUNTER — Ambulatory Visit (HOSPITAL_COMMUNITY): Payer: Medicare Other | Attending: Internal Medicine | Admitting: Radiology

## 2012-05-10 DIAGNOSIS — E669 Obesity, unspecified: Secondary | ICD-10-CM | POA: Diagnosis not present

## 2012-05-10 DIAGNOSIS — I2510 Atherosclerotic heart disease of native coronary artery without angina pectoris: Secondary | ICD-10-CM | POA: Diagnosis not present

## 2012-05-10 DIAGNOSIS — I10 Essential (primary) hypertension: Secondary | ICD-10-CM | POA: Insufficient documentation

## 2012-05-10 DIAGNOSIS — E785 Hyperlipidemia, unspecified: Secondary | ICD-10-CM | POA: Diagnosis not present

## 2012-05-10 DIAGNOSIS — I4949 Other premature depolarization: Secondary | ICD-10-CM | POA: Insufficient documentation

## 2012-05-10 NOTE — Progress Notes (Signed)
Echocardiogram performed.  

## 2012-05-21 ENCOUNTER — Emergency Department (HOSPITAL_COMMUNITY): Payer: Medicare Other

## 2012-05-21 ENCOUNTER — Emergency Department (HOSPITAL_COMMUNITY)
Admission: EM | Admit: 2012-05-21 | Discharge: 2012-05-21 | Disposition: A | Discharge status: Home / Self Care | Payer: Medicare Other | Attending: Emergency Medicine | Admitting: Emergency Medicine

## 2012-05-21 ENCOUNTER — Encounter (HOSPITAL_COMMUNITY): Payer: Self-pay

## 2012-05-21 DIAGNOSIS — Z87828 Personal history of other (healed) physical injury and trauma: Secondary | ICD-10-CM | POA: Diagnosis not present

## 2012-05-21 DIAGNOSIS — Z8739 Personal history of other diseases of the musculoskeletal system and connective tissue: Secondary | ICD-10-CM | POA: Insufficient documentation

## 2012-05-21 DIAGNOSIS — R5383 Other fatigue: Secondary | ICD-10-CM

## 2012-05-21 DIAGNOSIS — E785 Hyperlipidemia, unspecified: Secondary | ICD-10-CM | POA: Insufficient documentation

## 2012-05-21 DIAGNOSIS — Z8546 Personal history of malignant neoplasm of prostate: Secondary | ICD-10-CM | POA: Diagnosis not present

## 2012-05-21 DIAGNOSIS — Z87891 Personal history of nicotine dependence: Secondary | ICD-10-CM | POA: Diagnosis not present

## 2012-05-21 DIAGNOSIS — D649 Anemia, unspecified: Secondary | ICD-10-CM | POA: Insufficient documentation

## 2012-05-21 DIAGNOSIS — R5381 Other malaise: Secondary | ICD-10-CM | POA: Diagnosis not present

## 2012-05-21 DIAGNOSIS — E669 Obesity, unspecified: Secondary | ICD-10-CM | POA: Diagnosis not present

## 2012-05-21 DIAGNOSIS — Z8701 Personal history of pneumonia (recurrent): Secondary | ICD-10-CM | POA: Insufficient documentation

## 2012-05-21 DIAGNOSIS — R072 Precordial pain: Secondary | ICD-10-CM | POA: Diagnosis not present

## 2012-05-21 DIAGNOSIS — Z862 Personal history of diseases of the blood and blood-forming organs and certain disorders involving the immune mechanism: Secondary | ICD-10-CM | POA: Diagnosis not present

## 2012-05-21 DIAGNOSIS — F3289 Other specified depressive episodes: Secondary | ICD-10-CM | POA: Insufficient documentation

## 2012-05-21 DIAGNOSIS — Z79899 Other long term (current) drug therapy: Secondary | ICD-10-CM | POA: Insufficient documentation

## 2012-05-21 DIAGNOSIS — K219 Gastro-esophageal reflux disease without esophagitis: Secondary | ICD-10-CM | POA: Diagnosis not present

## 2012-05-21 DIAGNOSIS — R079 Chest pain, unspecified: Secondary | ICD-10-CM | POA: Insufficient documentation

## 2012-05-21 DIAGNOSIS — I2510 Atherosclerotic heart disease of native coronary artery without angina pectoris: Secondary | ICD-10-CM | POA: Insufficient documentation

## 2012-05-21 DIAGNOSIS — Z8711 Personal history of peptic ulcer disease: Secondary | ICD-10-CM | POA: Insufficient documentation

## 2012-05-21 DIAGNOSIS — Z8709 Personal history of other diseases of the respiratory system: Secondary | ICD-10-CM | POA: Insufficient documentation

## 2012-05-21 DIAGNOSIS — Z8639 Personal history of other endocrine, nutritional and metabolic disease: Secondary | ICD-10-CM | POA: Insufficient documentation

## 2012-05-21 DIAGNOSIS — J45909 Unspecified asthma, uncomplicated: Secondary | ICD-10-CM | POA: Diagnosis not present

## 2012-05-21 DIAGNOSIS — I10 Essential (primary) hypertension: Secondary | ICD-10-CM | POA: Insufficient documentation

## 2012-05-21 DIAGNOSIS — F329 Major depressive disorder, single episode, unspecified: Secondary | ICD-10-CM | POA: Insufficient documentation

## 2012-05-21 DIAGNOSIS — Z8719 Personal history of other diseases of the digestive system: Secondary | ICD-10-CM | POA: Diagnosis not present

## 2012-05-21 DIAGNOSIS — Z7982 Long term (current) use of aspirin: Secondary | ICD-10-CM | POA: Diagnosis not present

## 2012-05-21 DIAGNOSIS — IMO0002 Reserved for concepts with insufficient information to code with codable children: Secondary | ICD-10-CM | POA: Diagnosis not present

## 2012-05-21 DIAGNOSIS — R0789 Other chest pain: Secondary | ICD-10-CM | POA: Diagnosis not present

## 2012-05-21 LAB — BASIC METABOLIC PANEL
BUN: 16 mg/dL (ref 6–23)
CO2: 24 mEq/L (ref 19–32)
Calcium: 9.1 mg/dL (ref 8.4–10.5)
Chloride: 99 mEq/L (ref 96–112)
Creatinine, Ser: 1.36 mg/dL — ABNORMAL HIGH (ref 0.50–1.35)
GFR calc Af Amer: 60 mL/min — ABNORMAL LOW (ref >90)
GFR calc non Af Amer: 52 mL/min — ABNORMAL LOW (ref >90)
Glucose, Bld: 130 mg/dL — ABNORMAL HIGH (ref 70–99)
Potassium: 3.8 mEq/L (ref 3.5–5.1)
Sodium: 133 mEq/L — ABNORMAL LOW (ref 135–145)

## 2012-05-21 LAB — CBC
HCT: 44.4 % (ref 39.0–52.0)
Hemoglobin: 15.6 g/dL (ref 13.0–17.0)
MCH: 30.6 pg (ref 26.0–34.0)
MCHC: 35.1 g/dL (ref 30.0–36.0)
MCV: 87.2 fL (ref 78.0–100.0)
Platelets: 168 10*3/uL (ref 150–400)
RBC: 5.09 MIL/uL (ref 4.22–5.81)
RDW: 12.1 % (ref 11.5–15.5)
WBC: 5.4 10*3/uL (ref 4.0–10.5)

## 2012-05-21 LAB — TSH: TSH: 0.707 u[IU]/mL (ref 0.350–4.500)

## 2012-05-21 LAB — TROPONIN I: Troponin I: <0.3 ng/mL (ref <0.30)

## 2012-05-21 MED ORDER — SODIUM CHLORIDE 0.9 % IV BOLUS (SEPSIS)
1000.0000 mL | Freq: Once | INTRAVENOUS | Status: AC
  Administered 2012-05-21: 1000 mL via INTRAVENOUS

## 2012-05-21 MED ORDER — ASPIRIN 81 MG PO CHEW
324.0000 mg | CHEWABLE_TABLET | Freq: Once | ORAL | Status: AC
  Administered 2012-05-21: 324 mg via ORAL
  Filled 2012-05-21: qty 3
  Filled 2012-05-21: qty 1

## 2012-05-21 NOTE — ED Provider Notes (Signed)
History    72 year old male with fatigue. Generalized weakness which began yesterday morning and has been relatively constant since. Denies any focal deficits. Is generally achy. He is having some pain in his substernal chest area. Patient does have a known history of coronary artery disease reports recent stress testing. No shortness of breath. No fevers or chills. No unusual leg pain or swelling. No cough. No blood in stool or melena. No sick contacts. No urinary complaints. CSN: [id number]  Arrival date & time 05/21/12  0807   First MD Initiated Contact with Patient 05/21/12 0818      Chief Complaint  Patient presents with  . Chest Pain    (Consider location/radiation/quality/duration/timing/severity/associated sxs/prior treatment) HPI  Past Medical History  Diagnosis Date  . Asthma   . CAD (coronary artery disease)     remote CABG 2001  . Depression   . GERD (gastroesophageal reflux disease)   . Hyperlipidemia   . IBS (irritable bowel syndrome)   . Acute medial meniscal tear   . DJD (degenerative joint disease)   . Hypertension   . Anemia   . Diverticulosis   . Obesity   . Pancreatitis   . PUD (peptic ulcer disease)   . Pneumonia   . Sleep apnea   . Prostate cancer   . Acute sinusitis 12/14/2006    Qualifier: Diagnosis of  By: Lovell Sheehan MD, Balinda Quails     Past Surgical History  Procedure Laterality Date  . Thyroidectomy, partial    . Coronary artery bypass graft    . Cholecystectomy    . Cervical fusion    . Cervical laminectomy  05/26/06  . Arthroscopy rt knee  04/2008    daldoerf    Family History  Problem Relation Age of Onset  . Heart disease Father     History  Substance Use Topics  . Smoking status: Former Smoker    Quit date: 06/16/1992  . Smokeless tobacco: Not on file     Comment: smoked 1ppd for 35 years  . Alcohol Use: No      Review of Systems  All systems reviewed and negative, other than as noted in HPI.   Allergies  Bromfed;  Clarithromycin; Codeine; Doxycycline hyclate; Erythromycin base; Hydrocodone-acetaminophen; Modafinil; Promethazine hcl; Sulfonamide derivatives; and Telithromycin  Home Medications   Current Outpatient Rx  Name  Route  Sig  Dispense  Refill  . aspirin 81 MG tablet   Oral   Take 81 mg by mouth daily.           . beclomethasone (QVAR) 80 MCG/ACT inhaler   Inhalation   Inhale 1 puff into the lungs 2 (two) times daily.         [name] desloratadine (CLARINEX) 5 MG tablet   Oral   Take 5 mg by mouth daily.           . diazepam (VALIUM) 5 MG tablet   Oral   Take 5 mg by mouth at bedtime as needed for anxiety or sleep.         . fluticasone (FLONASE) 50 MCG/ACT nasal spray   Nasal   Place 1 spray into the nose 2 (two) times daily.         . folic acid (FOLVITE) 1 MG tablet   Oral   Take 1 mg by mouth daily.           . Ginger, Zingiber officinalis, (GINGER PO)   Oral   Take 1 tablet by mouth 2 (  two) times daily.         . Ginkgo Biloba 40 MG TABS   Oral   Take 1 tablet by mouth daily.          [name] LAC-HYDRIN FIVE 5 % LOTN lotion      APPLY TO AFFECTED AREA(S) TWICE DAILY   120 mL   2   . nitroGLYCERIN (NITROSTAT) 0.4 MG SL tablet   Sublingual   Place 0.4 mg under the tongue every 5 (five) minutes as needed.           [name] olmesartan-hydrochlorothiazide (BENICAR HCT) 20-12.5 MG per tablet               . omeprazole (PRILOSEC) 20 MG capsule   Oral   Take 20 mg by mouth daily.          . polyethylene glycol (MIRALAX / GLYCOLAX) packet   Oral   Take 17 g by mouth daily.           . Probiotic Product (ALIGN) 4 MG CAPS   Oral   Take 1 tablet by mouth daily.          . rosuvastatin (CRESTOR) 10 MG tablet               . vitamin B-12 (CYANOCOBALAMIN) 100 MCG tablet   Oral   Take 100 mcg by mouth daily.            BP 138/73  Pulse 80  Temp(Src) 97.9 F (36.6 C) (Oral)  Resp 18  Ht 5' 5.5" (1.664 m)  Wt 186 lb (84.369 kg)  BMI 30.47  kg/m2  SpO2 100%  Physical Exam  Nursing note and vitals reviewed. Constitutional: He appears well-developed and well-nourished. No distress.  HENT:  Head: Normocephalic and atraumatic.  Eyes: Conjunctivae are normal. Right eye exhibits no discharge. Left eye exhibits no discharge.  Neck: Neck supple.  Cardiovascular: Normal rate, regular rhythm and normal heart sounds.  Exam reveals no gallop and no friction rub.   No murmur heard. Pulmonary/Chest: Effort normal and breath sounds normal. No respiratory distress. He exhibits tenderness.  Abdominal: Soft. He exhibits no distension. There is no tenderness.  Musculoskeletal: He exhibits no edema and no tenderness.  Neurological: He is alert.  Skin: Skin is warm and dry.  Psychiatric: He has a normal mood and affect. His behavior is normal. Thought content normal.    ED Course  Procedures (including critical care time)  Labs Reviewed  BASIC METABOLIC PANEL - Abnormal; Notable for the following:    Sodium 133 (*)    Glucose, Bld 130 (*)    Creatinine, Ser 1.36 (*)    GFR calc non Af Amer 52 (*)    GFR calc Af Amer 60 (*)    All other components within normal limits  CBC  TSH  TROPONIN I   Dg Chest 2 View  05/21/2012  *RADIOLOGY REPORT*  Clinical Data: Mid chest pain.  CHEST - 2 VIEW  Comparison: 04/29/2010  Findings: Prior CABG.  Heart is normal size.  Lungs are clear.  No effusions or acute bony abnormality.  IMPRESSION: CABG.  No acute findings.   Original Report Authenticated By: Charlett Nose, M.D.    EKG:  Rhythm: normal sinus Vent. rate 77 BPM PR interval 172 ms QRS duration 84 ms QT/QTc 368/416 ms ST segments: NS ST changes Compar   1. Fatigue   2. Chest pain       MDM  72 year old  male with "extreme fatigue" and chest pain. Patient known history of CAD, but symptoms atypical with sharp fleeting pain and resproducibility. Patient just had a exercise stress test on February 10. He was clinically negative for chest  pain. His EKG was negative for ischemia at that time but he was having multiple PVCs. Because of this, he had a followup echocardiogram which is fairly unremarkable as well. Patient's labs unremarkable today aside from minimal hyponatremia which is likely not contributable to his presenting complaint. Some renal insufficiency which is known and near baseline. Chest x-ray is clear. EKG with some mild changes from prior, but not overly concernig. Troponin is normal. Etiology of patient's fatigue is not clear, but I have a low suspicion for emergent etiology. I feel he is safe for discharge at this time. Emergent return precautions discussed. TSH sent, but will not be resulted in expedient manner. May potentially be beneficial to follow-up provider.           Raeford Razor, MD 05/24/12 [phone]

## 2012-05-21 NOTE — ED Notes (Signed)
He states his symptoms began yesterday morning with general fatigue and aches. He states this "weakness" persisted today, plus, this morning he experienced left-sided "sharp" discomfort which is not made worse by inspiration, but is made worse by palpation. Further, he states he has a remote history of "rib cartilage inflammation".  He is in no distress.  His skin is pale, warm and dry and he is breathing normally. EKG performed at triage.

## 2012-05-21 NOTE — ED Notes (Signed)
MD at bedside. 

## 2012-05-21 NOTE — ED Notes (Signed)
C/O generalized weakness and aching. C/O localized chest pain 2/10. Patient states that he doesn't believe he is having a heart attack. Has seen his cardiologist recently and states that everything was good except that he had frequent PVC's.

## 2012-05-23 ENCOUNTER — Ambulatory Visit (INDEPENDENT_AMBULATORY_CARE_PROVIDER_SITE_OTHER): Payer: Medicare Other | Admitting: Internal Medicine

## 2012-05-23 ENCOUNTER — Encounter: Payer: Self-pay | Admitting: Internal Medicine

## 2012-05-23 ENCOUNTER — Telehealth: Payer: Self-pay | Admitting: Internal Medicine

## 2012-05-23 VITALS — BP 130/80 | HR 76 | Temp 99.0°F | Resp 16 | Ht 66.5 in | Wt 190.0 lb

## 2012-05-23 DIAGNOSIS — A499 Bacterial infection, unspecified: Secondary | ICD-10-CM | POA: Diagnosis not present

## 2012-05-23 DIAGNOSIS — J329 Chronic sinusitis, unspecified: Secondary | ICD-10-CM | POA: Diagnosis not present

## 2012-05-23 DIAGNOSIS — B9689 Other specified bacterial agents as the cause of diseases classified elsewhere: Secondary | ICD-10-CM

## 2012-05-23 MED ORDER — AZITHROMYCIN 250 MG PO TABS: ORAL_TABLET | ORAL | Status: DC

## 2012-05-23 NOTE — Patient Instructions (Signed)
The Prilosec for one week.  Take the antibiotic as directed.  And use NyQuil for control the congestion and cough

## 2012-05-23 NOTE — Progress Notes (Signed)
Subjective:    Patient ID: Joshua [name], male    DOB: 07/17/1940, 72 y.o.   MRN: [id number]  HPI Chest pain with nausea Viral infection possible Er records noted   Review of Systems  Constitutional: Negative for fever and fatigue (depressed).  HENT: Negative for hearing loss, congestion, neck pain and postnasal drip.   Eyes: Negative for discharge, redness and visual disturbance.  Respiratory: Positive for cough. Negative for shortness of breath and wheezing.   Cardiovascular: Positive for chest pain. Negative for leg swelling.  Gastrointestinal: Positive for nausea and diarrhea. Negative for abdominal pain, constipation and abdominal distention.  Genitourinary: Negative for urgency and frequency.  Musculoskeletal: Negative for joint swelling and arthralgias.  Skin: Negative for color change and rash.  Neurological: Negative for weakness and light-headedness.  Hematological: Negative for adenopathy.  Psychiatric/Behavioral: Negative for behavioral problems.   Past Medical History  Diagnosis Date  . Asthma   . CAD (coronary artery disease)     remote CABG 2001  . Depression   . GERD (gastroesophageal reflux disease)   . Hyperlipidemia   . IBS (irritable bowel syndrome)   . Acute medial meniscal tear   . DJD (degenerative joint disease)   . Hypertension   . Anemia   . Diverticulosis   . Obesity   . Pancreatitis   . PUD (peptic ulcer disease)   . Pneumonia   . Sleep apnea   . Prostate cancer   . Acute sinusitis 12/14/2006    Qualifier: Diagnosis of  By: Lovell Sheehan MD, Balinda Quails     History   Social History  . Marital Status: Married    Spouse Name: N/A    Number of Children: N/A  . Years of Education: N/A   Occupational History  . retired    Social History Main Topics  . Smoking status: Former Smoker    Quit date: 06/16/1992  . Smokeless tobacco: Not on file     Comment: smoked 1ppd for 35 years  . Alcohol Use: No  . Drug Use: No  . Sexually Active: Yes    Other Topics Concern  . Not on file   Social History Narrative  . No narrative on file    Past Surgical History  Procedure Laterality Date  . Thyroidectomy, partial    . Coronary artery bypass graft    . Cholecystectomy    . Cervical fusion    . Cervical laminectomy  05/26/06  . Arthroscopy rt knee  04/2008    daldoerf    Family History  Problem Relation Age of Onset  . Heart disease Father     Allergies  Allergen Reactions  . Bromfed     REACTION: n\T\v  . Clarithromycin     REACTION: gastritis  . Codeine   . Doxycycline Hyclate     REACTION: n\T\v  . Erythromycin Base     REACTION: rash  . Hydrocodone-Acetaminophen     REACTION: rash  . Modafinil     REACTION: anxiety-nervousness  . Promethazine Hcl     REACTION: fainting  . Sulfonamide Derivatives     REACTION: rash  . Telithromycin     REACTION: nausea    Current Outpatient Prescriptions on File Prior to Visit  Medication Sig Dispense Refill  . aspirin 81 MG tablet Take 81 mg by mouth daily.        . beclomethasone (QVAR) 80 MCG/ACT inhaler Inhale 1 puff into the lungs 2 (two) times daily.      [name]  desloratadine (CLARINEX) 5 MG tablet Take 5 mg by mouth daily.        . diazepam (VALIUM) 5 MG tablet Take 5 mg by mouth at bedtime as needed for anxiety or sleep.      . fluticasone (FLONASE) 50 MCG/ACT nasal spray Place 1 spray into the nose 2 (two) times daily.      . folic acid (FOLVITE) 1 MG tablet Take 1 mg by mouth daily.        . Ginger, Zingiber officinalis, (GINGER PO) Take 1 tablet by mouth 2 (two) times daily.      . Ginkgo Biloba 40 MG TABS Take 1 tablet by mouth daily.       [name] LAC-HYDRIN FIVE 5 % LOTN lotion APPLY TO AFFECTED AREA(S) TWICE DAILY  120 mL  2  . nitroGLYCERIN (NITROSTAT) 0.4 MG SL tablet Place 0.4 mg under the tongue every 5 (five) minutes as needed.        [name] olmesartan-hydrochlorothiazide (BENICAR HCT) 20-12.5 MG per tablet       . omeprazole (PRILOSEC) 20 MG capsule Take 20 mg by  mouth daily.       . polyethylene glycol (MIRALAX / GLYCOLAX) packet Take 17 g by mouth daily.        . Probiotic Product (ALIGN) 4 MG CAPS Take 1 tablet by mouth daily.       . rosuvastatin (CRESTOR) 10 MG tablet       . vitamin B-12 (CYANOCOBALAMIN) 100 MCG tablet Take 100 mcg by mouth daily.        No current facility-administered medications on file prior to visit.    BP 130/80  Pulse 76  Temp(Src) 99 F (37.2 C)  Resp 16  Ht 5' 6.5" (1.689 m)  Wt 190 lb (86.183 kg)  BMI 30.21 kg/m2       Objective:   Physical Exam  Constitutional: He appears well-developed and well-nourished.  HENT:  Head: Normocephalic and atraumatic.  Eyes: Conjunctivae are normal. Pupils are equal, round, and reactive to light.  Neck: Normal range of motion. Neck supple.  Cardiovascular: Normal rate and regular rhythm.   Pulmonary/Chest: Effort normal and breath sounds normal.  Abdominal: Soft. Bowel sounds are normal.          Assessment & Plan:  Upper respiratory tract infection most probably bacterial sinusitis as a precipitant Increased GERD symptoms do to persistent postnasal drip and reflux NyQuil to control cough and secretions Z-Pak for infection Increase Prilosec to 20 by mouth twice a day for one week

## 2012-05-23 NOTE — Telephone Encounter (Signed)
Patient Information:  Caller Name: [facility]  Phone: [phone]  Patient: Joshua [name], Joshua [name]  Gender: Male  DOB: 02-06-1941  Age: 72 Years  PCP: Darryll Capers (Adults only)  Office Follow Up:  Does the office need to follow up with this patient?: No  Instructions For The Office: N/A  RN Note:  Patient is on Benicar/HCTZ.  Symptoms  Reason For Call & Symptoms: Patient states onset Friday  05/20/12 felt achy and chilled and slept most of the day.  Satruday 05/21/12 felt nauseated  and had some Chest pain and was sent to the ER for evaluation. He was discharged with " viral syndrome"  but continues to have extreme nausea, tired and just feels 'washed out".  +Drinking , decreased appetite.  Reviewed Health History In EMR: Yes  Reviewed Medications In EMR: Yes  Reviewed Allergies In EMR: Yes  Reviewed Surgeries / Procedures: No  Date of Onset of Symptoms: 05/20/2012  Treatments Tried: Seen in the ER on Saturday  Treatments Tried Worked: No  Guideline(s) Used:  Weakness (Generalized) and Fatigue  Disposition Per Guideline:   See Today in Office  Reason For Disposition Reached:   Taking a medicine that could cause weakness (e.g., blood pressure medications, diuretics)  Advice Given:  Reassurance  Weakness often accompanies viral illnesses (e.g., colds and flu).  The weakness is usually worse the first 3 days of the illness, then gets better.  A fever can make you feel weak.  Here is some care advice that should help.  Reassurance  Not drinking enough fluids and being a little dehydrated is a common cause of mild weakness.  Fluids  : Drink several glasses of fruit juice, other clear fluids, or water. This will improve hydration and blood glucose.  Rest  : Lie down with feet elevated for 1 hour. This will improve blood flow and increase blood flow to the brain.  Call Back If:   Still feeling weak after 2 hours of rest and fluids  Passes out (faints)  You become  worse.  Appointment Scheduled:  05/23/2012 11:15:00 Appointment Scheduled Provider:  Darryll Capers (Adults only)

## 2012-05-25 ENCOUNTER — Ambulatory Visit (INDEPENDENT_AMBULATORY_CARE_PROVIDER_SITE_OTHER): Payer: Medicare Other | Admitting: Family

## 2012-05-25 ENCOUNTER — Encounter: Payer: Self-pay | Admitting: Family

## 2012-05-25 ENCOUNTER — Telehealth: Payer: Self-pay | Admitting: Internal Medicine

## 2012-05-25 VITALS — BP 130/92 | HR 87 | Temp 98.3°F

## 2012-05-25 DIAGNOSIS — J329 Chronic sinusitis, unspecified: Secondary | ICD-10-CM | POA: Diagnosis not present

## 2012-05-25 DIAGNOSIS — R112 Nausea with vomiting, unspecified: Secondary | ICD-10-CM

## 2012-05-25 DIAGNOSIS — H8110 Benign paroxysmal vertigo, unspecified ear: Secondary | ICD-10-CM | POA: Diagnosis not present

## 2012-05-25 DIAGNOSIS — A499 Bacterial infection, unspecified: Secondary | ICD-10-CM | POA: Diagnosis not present

## 2012-05-25 DIAGNOSIS — B9689 Other specified bacterial agents as the cause of diseases classified elsewhere: Secondary | ICD-10-CM

## 2012-05-25 MED ORDER — AMOXICILLIN 500 MG PO TABS: 1000.0000 mg | ORAL_TABLET | Freq: Two times a day (BID) | ORAL | Status: AC

## 2012-05-25 MED ORDER — ONDANSETRON HCL 8 MG PO TABS: 8.0000 mg | ORAL_TABLET | Freq: Three times a day (TID) | ORAL | Status: DC | PRN

## 2012-05-25 NOTE — Progress Notes (Signed)
Subjective:    Patient ID: Joshua [name], male    DOB: [birth year], 72 y.o.   MRN: [id number]  HPI 72 year old white male, nonsmoker, patient of Dr. Lovell Sheehan is in today with complaints of dizziness, nausea and blurred vision since starting the Z-Pak on Monday. Patient saw Dr. Lovell Sheehan and was treated for bacterial sinusitis. He feels better with progressive fatigue today. Was experiencing nausea that is slowly improving. He continues to have chest congestion and nasal. Denies any sinus pressure or pain.   Review of Systems  Constitutional: Negative.   HENT: Positive for congestion and sneezing. Negative for sinus pressure.   Respiratory: Negative.   Cardiovascular: Negative.   Gastrointestinal: Positive for nausea and vomiting. Negative for constipation.  Allergic/Immunologic: Negative.   Neurological: Negative.   Psychiatric/Behavioral: Negative.    Past Medical History  Diagnosis Date  . Asthma   . CAD (coronary artery disease)     remote CABG 2001  . Depression   . GERD (gastroesophageal reflux disease)   . Hyperlipidemia   . IBS (irritable bowel syndrome)   . Acute medial meniscal tear   . DJD (degenerative joint disease)   . Hypertension   . Anemia   . Diverticulosis   . Obesity   . Pancreatitis   . PUD (peptic ulcer disease)   . Pneumonia   . Sleep apnea   . Prostate cancer   . Acute sinusitis 12/14/2006    Qualifier: Diagnosis of  By: Lovell Sheehan MD, Balinda Quails     History   Social History  . Marital Status: Married    Spouse Name: N/A    Number of Children: N/A  . Years of Education: N/A   Occupational History  . retired    Social History Main Topics  . Smoking status: Former Smoker    Quit date: 06/16/1992  . Smokeless tobacco: Not on file     Comment: smoked 1ppd for 35 years  . Alcohol Use: No  . Drug Use: No  . Sexually Active: Yes   Other Topics Concern  . Not on file   Social History Narrative  . No narrative on file    Past Surgical  History  Procedure Laterality Date  . Thyroidectomy, partial    . Coronary artery bypass graft    . Cholecystectomy    . Cervical fusion    . Cervical laminectomy  05/26/06  . Arthroscopy rt knee  04/2008    daldoerf    Family History  Problem Relation Age of Onset  . Heart disease Father     Allergies  Allergen Reactions  . Bromfed     REACTION: n\T\v  . Clarithromycin     REACTION: gastritis  . Codeine   . Doxycycline Hyclate     REACTION: n\T\v  . Erythromycin Base     REACTION: rash  . Hydrocodone-Acetaminophen     REACTION: rash  . Modafinil     REACTION: anxiety-nervousness  . Promethazine Hcl     REACTION: fainting  . Sulfonamide Derivatives     REACTION: rash  . Telithromycin     REACTION: nausea    Current Outpatient Prescriptions on File Prior to Visit  Medication Sig Dispense Refill  . aspirin 81 MG tablet Take 81 mg by mouth daily.        [name] azithromycin (ZITHROMAX Z-PAK) 250 MG tablet Two po now then one a day for 4 days  6 each  0  . beclomethasone (QVAR) 80 MCG/ACT inhaler Inhale 1  puff into the lungs 2 (two) times daily.      [name] desloratadine (CLARINEX) 5 MG tablet Take 5 mg by mouth daily.        . diazepam (VALIUM) 5 MG tablet Take 5 mg by mouth at bedtime as needed for anxiety or sleep.      . fluticasone (FLONASE) 50 MCG/ACT nasal spray Place 1 spray into the nose 2 (two) times daily.      . folic acid (FOLVITE) 1 MG tablet Take 1 mg by mouth daily.        . Ginger, Zingiber officinalis, (GINGER PO) Take 1 tablet by mouth 2 (two) times daily.      . Ginkgo Biloba 40 MG TABS Take 1 tablet by mouth daily.       [name] LAC-HYDRIN FIVE 5 % LOTN lotion APPLY TO AFFECTED AREA(S) TWICE DAILY  120 mL  2  . nitroGLYCERIN (NITROSTAT) 0.4 MG SL tablet Place 0.4 mg under the tongue every 5 (five) minutes as needed.        [name] olmesartan-hydrochlorothiazide (BENICAR HCT) 20-12.5 MG per tablet       . omeprazole (PRILOSEC) 20 MG capsule Take 20 mg by mouth daily.        . polyethylene glycol (MIRALAX / GLYCOLAX) packet Take 17 g by mouth daily.        . Probiotic Product (ALIGN) 4 MG CAPS Take 1 tablet by mouth daily.       . rosuvastatin (CRESTOR) 10 MG tablet       . vitamin B-12 (CYANOCOBALAMIN) 100 MCG tablet Take 100 mcg by mouth daily.        No current facility-administered medications on file prior to visit.    BP 130/92  Pulse 87  Temp(Src) 98.3 F (36.8 C) (Oral)  SpO2 95%chart    Objective:   Physical Exam  Constitutional: He is oriented to person, place, and time. He appears well-developed and well-nourished.  HENT:  Right Ear: External ear normal.  Left Ear: External ear normal.  Nose: Nose normal.  Mouth/Throat: Oropharynx is clear and moist.  Neck: Normal range of motion. Neck supple.  Cardiovascular: Normal rate, regular rhythm and normal heart sounds.   Pulmonary/Chest: Effort normal and breath sounds normal.  Abdominal: Soft. Bowel sounds are normal.  Neurological: He is alert and oriented to person, place, and time.  Skin: Skin is warm and dry.  Psychiatric: He has a normal mood and affect.          Assessment & Plan:  Assessment:  1. Bacterial sinusitis 2. Allergic reaction to medication 3. Nausea 4. Dizziness  Plan: DC azithromycin. Start amoxicillin 500 mg 2 capsules by mouth twice a day x10 days. Zofran 8 mg every 8 hours as needed for nausea. Rest. Drink plenty of fluids. Patient call the office if symptoms worsen or persist. Recheck as scheduled, and as needed.

## 2012-05-25 NOTE — Patient Instructions (Signed)
Vertigo Vertigo means you feel like you or your surroundings are moving when they are not. Vertigo can be dangerous if it occurs when you are at work, driving, or performing difficult activities.  CAUSES  Vertigo occurs when there is a conflict of signals sent to your brain from the visual and sensory systems in your body. There are many different causes of vertigo, including:  Infections, especially in the inner ear.  A bad reaction to a drug or misuse of alcohol and medicines.  Withdrawal from drugs or alcohol.  Rapidly changing positions, such as lying down or rolling over in bed.  A migraine headache.  Decreased blood flow to the brain.  Increased pressure in the brain from a head injury, infection, tumor, or bleeding. SYMPTOMS  You may feel as though the world is spinning around or you are falling to the ground. Because your balance is upset, vertigo can cause nausea and vomiting. You may have involuntary eye movements (nystagmus). DIAGNOSIS  Vertigo is usually diagnosed by physical exam. If the cause of your vertigo is unknown, your caregiver may perform imaging tests, such as an MRI scan (magnetic resonance imaging). TREATMENT  Most cases of vertigo resolve on their own, without treatment. Depending on the cause, your caregiver may prescribe certain medicines. If your vertigo is related to body position issues, your caregiver may recommend movements or procedures to correct the problem. In rare cases, if your vertigo is caused by certain inner ear problems, you may need surgery. HOME CARE INSTRUCTIONS   Follow your caregiver's instructions.  Avoid driving.  Avoid operating heavy machinery.  Avoid performing any tasks that would be dangerous to you or others during a vertigo episode.  Tell your caregiver if you notice that certain medicines seem to be causing your vertigo. Some of the medicines used to treat vertigo episodes can actually make them worse in some people. SEEK  IMMEDIATE MEDICAL CARE IF:   Your medicines do not relieve your vertigo or are making it worse.  You develop problems with talking, walking, weakness, or using your arms, hands, or legs.  You develop severe headaches.  Your nausea or vomiting continues or gets worse.  You develop visual changes.  A family member notices behavioral changes.  Your condition gets worse. MAKE SURE YOU:  Understand these instructions.  Will watch your condition.  Will get help right away if you are not doing well or get worse. Document Released: 12/17/2004 Document Revised: 06/01/2011 Document Reviewed: 09/25/2010 Exit[employer] Patient Information 2013 [employer].  

## 2012-05-25 NOTE — Telephone Encounter (Signed)
Patient Information:  Caller Name: Lionardo  Phone: [phone]  Patient: Joshua [name], Joshua [name]  Gender: Male  DOB: Mar 13, 1941  Age: 72 Years  PCP: Darryll Capers (Adults only)  Office Follow Up:  Does the office need to follow up with this patient?: No  Instructions For The Office: N/A   Symptoms  Reason For Call & Symptoms: Pt states he was seen in the office on Monday 05/23/12 and given Rx Azithromycin and started having dizziness and blurred vision with nausea.  Reviewed Health History In EMR: Yes  Reviewed Medications In EMR: Yes  Reviewed Allergies In EMR: Yes  Reviewed Surgeries / Procedures: Yes  Date of Onset of Symptoms: 05/24/2012  Guideline(s) Used:  Dizziness  Disposition Per Guideline:   Go to Office Now  Reason For Disposition Reached:   Lightheadedness (dizziness) present now, after 2 hours of rest and fluids  Advice Given:  N/A  Appointment Scheduled:  05/25/2012 10:45:00 Appointment Scheduled Provider:  Adline Mango [facility])

## 2012-05-30 DIAGNOSIS — Z8546 Personal history of malignant neoplasm of prostate: Secondary | ICD-10-CM | POA: Diagnosis not present

## 2012-06-01 DIAGNOSIS — N393 Stress incontinence (female) (male): Secondary | ICD-10-CM | POA: Diagnosis not present

## 2012-06-01 DIAGNOSIS — Z8546 Personal history of malignant neoplasm of prostate: Secondary | ICD-10-CM | POA: Diagnosis not present

## 2012-06-01 DIAGNOSIS — N529 Male erectile dysfunction, unspecified: Secondary | ICD-10-CM | POA: Diagnosis not present

## 2012-06-06 DIAGNOSIS — J309 Allergic rhinitis, unspecified: Secondary | ICD-10-CM | POA: Diagnosis not present

## 2012-06-14 DIAGNOSIS — Z8546 Personal history of malignant neoplasm of prostate: Secondary | ICD-10-CM | POA: Diagnosis not present

## 2012-06-14 DIAGNOSIS — R279 Unspecified lack of coordination: Secondary | ICD-10-CM | POA: Diagnosis not present

## 2012-06-14 DIAGNOSIS — M62838 Other muscle spasm: Secondary | ICD-10-CM | POA: Diagnosis not present

## 2012-06-20 DIAGNOSIS — J309 Allergic rhinitis, unspecified: Secondary | ICD-10-CM | POA: Diagnosis not present

## 2012-06-22 DIAGNOSIS — R279 Unspecified lack of coordination: Secondary | ICD-10-CM | POA: Diagnosis not present

## 2012-06-22 DIAGNOSIS — M62838 Other muscle spasm: Secondary | ICD-10-CM | POA: Diagnosis not present

## 2012-06-22 DIAGNOSIS — C61 Malignant neoplasm of prostate: Secondary | ICD-10-CM | POA: Diagnosis not present

## 2012-06-22 DIAGNOSIS — M6281 Muscle weakness (generalized): Secondary | ICD-10-CM | POA: Diagnosis not present

## 2012-06-29 DIAGNOSIS — M6281 Muscle weakness (generalized): Secondary | ICD-10-CM | POA: Diagnosis not present

## 2012-06-29 DIAGNOSIS — C61 Malignant neoplasm of prostate: Secondary | ICD-10-CM | POA: Diagnosis not present

## 2012-06-29 DIAGNOSIS — R279 Unspecified lack of coordination: Secondary | ICD-10-CM | POA: Diagnosis not present

## 2012-06-29 DIAGNOSIS — M62838 Other muscle spasm: Secondary | ICD-10-CM | POA: Diagnosis not present

## 2012-07-04 DIAGNOSIS — J309 Allergic rhinitis, unspecified: Secondary | ICD-10-CM | POA: Diagnosis not present

## 2012-07-05 ENCOUNTER — Telehealth: Payer: Self-pay | Admitting: Internal Medicine

## 2012-07-05 MED ORDER — DIAZEPAM 5 MG PO TABS: 5.0000 mg | ORAL_TABLET | Freq: Every evening | ORAL | Status: DC | PRN

## 2012-07-05 NOTE — Telephone Encounter (Signed)
Pt needs refill of diazepam (VALIUM) 5 MG tablet. Pt has appt 07/15/12 Pharm: [facility]/ [address]

## 2012-07-11 DIAGNOSIS — M62838 Other muscle spasm: Secondary | ICD-10-CM | POA: Diagnosis not present

## 2012-07-11 DIAGNOSIS — R279 Unspecified lack of coordination: Secondary | ICD-10-CM | POA: Diagnosis not present

## 2012-07-11 DIAGNOSIS — N393 Stress incontinence (female) (male): Secondary | ICD-10-CM | POA: Diagnosis not present

## 2012-07-15 ENCOUNTER — Ambulatory Visit (INDEPENDENT_AMBULATORY_CARE_PROVIDER_SITE_OTHER): Payer: Medicare Other | Admitting: Internal Medicine

## 2012-07-15 ENCOUNTER — Encounter: Payer: Self-pay | Admitting: Internal Medicine

## 2012-07-15 VITALS — BP 132/80 | HR 76 | Temp 98.6°F | Resp 16 | Ht 66.5 in | Wt 193.0 lb

## 2012-07-15 DIAGNOSIS — M79605 Pain in left leg: Secondary | ICD-10-CM

## 2012-07-15 DIAGNOSIS — K589 Irritable bowel syndrome without diarrhea: Secondary | ICD-10-CM | POA: Diagnosis not present

## 2012-07-15 DIAGNOSIS — M549 Dorsalgia, unspecified: Secondary | ICD-10-CM

## 2012-07-15 DIAGNOSIS — E559 Vitamin D deficiency, unspecified: Secondary | ICD-10-CM

## 2012-07-15 DIAGNOSIS — I10 Essential (primary) hypertension: Secondary | ICD-10-CM | POA: Diagnosis not present

## 2012-07-15 DIAGNOSIS — M545 Low back pain, unspecified: Secondary | ICD-10-CM | POA: Diagnosis not present

## 2012-07-15 DIAGNOSIS — E785 Hyperlipidemia, unspecified: Secondary | ICD-10-CM

## 2012-07-15 DIAGNOSIS — Z23 Encounter for immunization: Secondary | ICD-10-CM

## 2012-07-15 DIAGNOSIS — G609 Hereditary and idiopathic neuropathy, unspecified: Secondary | ICD-10-CM | POA: Diagnosis not present

## 2012-07-15 DIAGNOSIS — J309 Allergic rhinitis, unspecified: Secondary | ICD-10-CM | POA: Diagnosis not present

## 2012-07-15 NOTE — Patient Instructions (Signed)
Due pneumovax today

## 2012-07-15 NOTE — Progress Notes (Signed)
Subjective:    Patient ID: Joshua [name], male    DOB: 1940-10-14, 72 y.o.   MRN: [id number]  HPI Was seen by therapist for pelvic pain and has been undergoing therapy Has been taking a topical for jock itch and this has helped HTn stable IBS stable   Review of Systems  Constitutional: Negative for fever and fatigue.  HENT: Negative for hearing loss, congestion, neck pain and postnasal drip.   Eyes: Negative for discharge, redness and visual disturbance.  Respiratory: Negative for cough, shortness of breath and wheezing.   Cardiovascular: Negative for leg swelling.  Gastrointestinal: Negative for abdominal pain, constipation and abdominal distention.  Genitourinary: Positive for scrotal swelling. Negative for urgency and frequency.  Musculoskeletal: Negative for joint swelling and arthralgias.  Skin: Positive for rash. Negative for color change.       groin  Neurological: Negative for weakness and light-headedness.  Hematological: Negative for adenopathy.  Psychiatric/Behavioral: Negative for behavioral problems.   Past Medical History  Diagnosis Date  . Asthma   . CAD (coronary artery disease)     remote CABG 2001  . Depression   . GERD (gastroesophageal reflux disease)   . Hyperlipidemia   . IBS (irritable bowel syndrome)   . Acute medial meniscal tear   . DJD (degenerative joint disease)   . Hypertension   . Anemia   . Diverticulosis   . Obesity   . Pancreatitis   . PUD (peptic ulcer disease)   . Pneumonia   . Sleep apnea   . Prostate cancer   . Acute sinusitis 12/14/2006    Qualifier: Diagnosis of  By: Lovell Sheehan MD, Balinda Quails     History   Social History  . Marital Status: Married    Spouse Name: N/A    Number of Children: N/A  . Years of Education: N/A   Occupational History  . retired    Social History Main Topics  . Smoking status: Former Smoker    Quit date: 06/16/1992  . Smokeless tobacco: Not on file     Comment: smoked 1ppd for 35 years  .  Alcohol Use: No  . Drug Use: No  . Sexually Active: Yes   Other Topics Concern  . Not on file   Social History Narrative  . No narrative on file    Past Surgical History  Procedure Laterality Date  . Thyroidectomy, partial    . Coronary artery bypass graft    . Cholecystectomy    . Cervical fusion    . Cervical laminectomy  05/26/06  . Arthroscopy rt knee  04/2008    daldoerf    Family History  Problem Relation Age of Onset  . Heart disease Father     Allergies  Allergen Reactions  . Azithromycin     Dizziness and double vision   . Bromfed     REACTION: n\T\v  . Clarithromycin     REACTION: gastritis  . Codeine   . Doxycycline Hyclate     REACTION: n\T\v  . Erythromycin Base     REACTION: rash  . Hydrocodone-Acetaminophen     REACTION: rash  . Modafinil     REACTION: anxiety-nervousness  . Promethazine Hcl     REACTION: fainting  . Sulfonamide Derivatives     REACTION: rash  . Telithromycin     REACTION: nausea    Current Outpatient Prescriptions on File Prior to Visit  Medication Sig Dispense Refill  . aspirin 81 MG tablet Take 81 mg by  mouth daily.        . beclomethasone (QVAR) 80 MCG/ACT inhaler Inhale 1 puff into the lungs 2 (two) times daily.      [name] desloratadine (CLARINEX) 5 MG tablet Take 5 mg by mouth daily.        . diazepam (VALIUM) 5 MG tablet Take 1 tablet (5 mg total) by mouth at bedtime as needed for anxiety or sleep.  30 tablet  3  . fluticasone (FLONASE) 50 MCG/ACT nasal spray Place 1 spray into the nose 2 (two) times daily.      . folic acid (FOLVITE) 1 MG tablet Take 1 mg by mouth daily.        . Ginger, Zingiber officinalis, (GINGER PO) Take 1 tablet by mouth 2 (two) times daily.      [name] LAC-HYDRIN FIVE 5 % LOTN lotion APPLY TO AFFECTED AREA(S) TWICE DAILY  120 mL  2  . nitroGLYCERIN (NITROSTAT) 0.4 MG SL tablet Place 0.4 mg under the tongue every 5 (five) minutes as needed.        [name] olmesartan-hydrochlorothiazide (BENICAR HCT) 20-12.5  MG per tablet       . omeprazole (PRILOSEC) 20 MG capsule Take 20 mg by mouth daily.       . ondansetron (ZOFRAN) 8 MG tablet Take 1 tablet (8 mg total) by mouth every 8 (eight) hours as needed for nausea.  20 tablet  0  . polyethylene glycol (MIRALAX / GLYCOLAX) packet Take 17 g by mouth daily.        . Probiotic Product (ALIGN) 4 MG CAPS Take 1 tablet by mouth daily.       . rosuvastatin (CRESTOR) 10 MG tablet       . vitamin B-12 (CYANOCOBALAMIN) 100 MCG tablet Take 100 mcg by mouth daily.        No current facility-administered medications on file prior to visit.    BP 132/80  Pulse 76  Temp(Src) 98.6 F (37 C)  Resp 16  Ht 5' 6.5" (1.689 m)  Wt 193 lb (87.544 kg)  BMI 30.69 kg/m2   Patient has mild    Objective:   Physical Exam  Nursing note and vitals reviewed. Constitutional: He appears well-developed and well-nourished.  HENT:  Head: Normocephalic and atraumatic.  Eyes: Conjunctivae are normal. Pupils are equal, round, and reactive to light.  Neck: Normal range of motion. Neck supple.  Cardiovascular: Normal rate and regular rhythm.   Murmur heard. Pulmonary/Chest: Effort normal and breath sounds normal.  Abdominal: Soft. Bowel sounds are normal.  Skin: Rash noted. There is pallor.          Assessment & Plan:  His reports mild tinia cruishas responded to topical therapy with Lotrimin Patient's blood pressure stable current medications.  he reports improvement of pelvic pain with physical therapy and stretching exercise protocol Reviewed blood work from March 1 hospitalization Patient is up-to-date on all other health maintenance issues based upon physical injury   Vit d levels check

## 2012-07-15 NOTE — Addendum Note (Signed)
Addended by: Alfred Levins D on: 07/15/2012 11:03 AM   Modules accepted: Orders

## 2012-07-16 LAB — VITAMIN D 25 HYDROXY (VIT D DEFICIENCY, FRACTURES): Vit D, 25-Hydroxy: 34 ng/mL (ref 30–89)

## 2012-07-18 ENCOUNTER — Encounter: Payer: Self-pay | Admitting: Internal Medicine

## 2012-07-19 DIAGNOSIS — R279 Unspecified lack of coordination: Secondary | ICD-10-CM | POA: Diagnosis not present

## 2012-07-19 DIAGNOSIS — M6281 Muscle weakness (generalized): Secondary | ICD-10-CM | POA: Diagnosis not present

## 2012-07-19 DIAGNOSIS — N393 Stress incontinence (female) (male): Secondary | ICD-10-CM | POA: Diagnosis not present

## 2012-07-19 DIAGNOSIS — M62838 Other muscle spasm: Secondary | ICD-10-CM | POA: Diagnosis not present

## 2012-07-20 ENCOUNTER — Telehealth: Payer: Self-pay | Admitting: Internal Medicine

## 2012-07-20 NOTE — Telephone Encounter (Signed)
Patient Information:  Caller Name: Joshua [name]  Phone: [phone]  Patient: Joshua [name], Joshua [name]  Gender: Male  DOB: Aug 27, 1940  Age: 72 Years  PCP: Darryll Capers (Adults only)  Office Follow Up:  Does the office need to follow up with this patient?: Yes  Instructions For The Office: Medication guidance- Pneumonia shot vs Lyrica.  Tired , weak and sleepy  RN Note:  Reviewed Health Education Information on Lyrica and Pneumonia shot.  patient unsure what to do with Lyrica? retry? was it pneumonia shot.  Please have Dr. Lovell Sheehan review. CONTACT PATIENT FOR GUIDANCE.  Symptoms  Reason For Call & Symptoms: Patient states he saw Dr. Lovell Sheehan on Friday 07/15/12. He received a pneumonia shot and sample of Lyrica 50 mg once daily.  He states on Saturday he was "wiped out" . He attributed this to Lyrica. "Very tired". He only took one pill  on Friday.  He is now thinking this might have been the pneumonia shot.   Unsure which is the culprit.  Afebrile. Awake but still feels tired and weak. No muscle aches.  Reviewed Health History In EMR: Yes  Reviewed Medications In EMR: Yes  Reviewed Allergies In EMR: Yes  Reviewed Surgeries / Procedures: Yes  Date of Onset of Symptoms: 07/15/2012  Guideline(s) Used:  No Protocol Available - Information Only  Disposition Per Guideline:   Discuss with PCP and Callback by Nurse Today  Reason For Disposition Reached:   Nursing judgment  Advice Given:  Call Back If:  New symptoms develop  You become worse.  Patient Will Follow Care Advice:  YES

## 2012-07-20 NOTE — Telephone Encounter (Signed)
Pt informed , it is the lyrica- just stay off of it. Pt informed

## 2012-07-28 DIAGNOSIS — M6281 Muscle weakness (generalized): Secondary | ICD-10-CM | POA: Diagnosis not present

## 2012-07-28 DIAGNOSIS — R279 Unspecified lack of coordination: Secondary | ICD-10-CM | POA: Diagnosis not present

## 2012-07-28 DIAGNOSIS — M62838 Other muscle spasm: Secondary | ICD-10-CM | POA: Diagnosis not present

## 2012-07-28 DIAGNOSIS — N393 Stress incontinence (female) (male): Secondary | ICD-10-CM | POA: Diagnosis not present

## 2012-08-01 DIAGNOSIS — J309 Allergic rhinitis, unspecified: Secondary | ICD-10-CM | POA: Diagnosis not present

## 2012-08-09 DIAGNOSIS — M62838 Other muscle spasm: Secondary | ICD-10-CM | POA: Diagnosis not present

## 2012-08-09 DIAGNOSIS — N393 Stress incontinence (female) (male): Secondary | ICD-10-CM | POA: Diagnosis not present

## 2012-08-09 DIAGNOSIS — R279 Unspecified lack of coordination: Secondary | ICD-10-CM | POA: Diagnosis not present

## 2012-08-16 DIAGNOSIS — J309 Allergic rhinitis, unspecified: Secondary | ICD-10-CM | POA: Diagnosis not present

## 2012-08-23 DIAGNOSIS — J309 Allergic rhinitis, unspecified: Secondary | ICD-10-CM | POA: Diagnosis not present

## 2012-08-29 DIAGNOSIS — J309 Allergic rhinitis, unspecified: Secondary | ICD-10-CM | POA: Diagnosis not present

## 2012-08-30 DIAGNOSIS — R279 Unspecified lack of coordination: Secondary | ICD-10-CM | POA: Diagnosis not present

## 2012-08-30 DIAGNOSIS — M62838 Other muscle spasm: Secondary | ICD-10-CM | POA: Diagnosis not present

## 2012-08-30 DIAGNOSIS — M6281 Muscle weakness (generalized): Secondary | ICD-10-CM | POA: Diagnosis not present

## 2012-09-01 DIAGNOSIS — J309 Allergic rhinitis, unspecified: Secondary | ICD-10-CM | POA: Diagnosis not present

## 2012-09-05 ENCOUNTER — Telehealth: Payer: Self-pay | Admitting: Internal Medicine

## 2012-09-05 DIAGNOSIS — J309 Allergic rhinitis, unspecified: Secondary | ICD-10-CM | POA: Diagnosis not present

## 2012-09-05 MED ORDER — FLUTICASONE PROPIONATE 50 MCG/ACT NA SUSP: NASAL | Status: DC

## 2012-09-05 NOTE — Telephone Encounter (Signed)
done

## 2012-09-05 NOTE — Telephone Encounter (Signed)
PT needs a print out of the last RX he received for his Flonase. The pharmacy states that it was 1 puff, once a day. We show, as the patient thought, 1 puff, TWICE a day. He would like a copy of that. Please assist.

## 2012-09-07 DIAGNOSIS — J309 Allergic rhinitis, unspecified: Secondary | ICD-10-CM | POA: Diagnosis not present

## 2012-09-12 DIAGNOSIS — J309 Allergic rhinitis, unspecified: Secondary | ICD-10-CM | POA: Diagnosis not present

## 2012-09-15 ENCOUNTER — Telehealth: Payer: Self-pay | Admitting: Internal Medicine

## 2012-09-15 NOTE — Telephone Encounter (Signed)
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  He asks for direction from PCP as to whether or not he should continue Crestor.   Symptoms  Reason For Call & Symptoms: Patient calling.  Reports he has "Crotch itch" and asks if there are other things he can do besides keeping clean and dry, using powder, Lamisil cream/ ointment.  He asks for refill on Lamisil for same.    Next question related to Crestor/ statins.  He asks if it may contribute to" painful neuropathy in both feet."  Did not find this as side effect listed in [facility].  Advised this may be discussed with provider at next appointment.  He asked to check with provider now instead.  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 Wash this area once daily with un-scented soap and water.  After washing, dry the groin area before the feet (Reason: to prevent spread of tinea pedis to groin area).  Wear cotton underwear (Reason: breathes and keeps area drier). Avoid nylon or tight-fitting underwear.  Antifungal Cream for Treatment of Jock Itch.  Available over-the-counter in [place] as clotrimazole (Lotrimin AF) or miconazole (Micatin, Monistat-Derm).  Expected Course:  The rash should clear up completely in 2-3 weeks.  Call Back  If:   Rash is not improving after 1 week on treatment  Rash is not completely cleared by 3 weeks  Fever or pain occurs  You become worse.  Patient Will Follow Care Advice:  YES

## 2012-09-16 NOTE — Telephone Encounter (Signed)
Per dr Lovell Sheehan- use lotrimine ultra and stay on the crestor

## 2012-09-26 DIAGNOSIS — J309 Allergic rhinitis, unspecified: Secondary | ICD-10-CM | POA: Diagnosis not present

## 2012-10-07 ENCOUNTER — Telehealth: Payer: Self-pay | Admitting: Internal Medicine

## 2012-10-07 DIAGNOSIS — R5381 Other malaise: Secondary | ICD-10-CM | POA: Diagnosis not present

## 2012-10-07 NOTE — Telephone Encounter (Signed)
Patient Information:  Caller Name: Temitope  Phone: [phone]  Patient: Joshua, [name]  Gender: Male  DOB: [birth year]  Age: 72 Years  PCP: Darryll Capers (Adults only)  Office Follow Up:  Does the office need to follow up with this patient?: No  Instructions For The Office: N/A  RN Note:  Denies diaphoresis, shoulder or arm pain. Able to take a deep breaths. No tachypnea. Plans to drive home over weekend.  Advised to see local UC or ED within 4 hours per nursing judgement based on cardiac risk factors and duration of vague chest symptoms.  Symptoms  Reason For Call & Symptoms: Feeling poorly with "wooziness" clarified to mean mild dizziness and weakness and "chest pressure or airyness, like too much air"  in upper central chest like its hard to breathe.  Also not sleeping well.  No wheezing or cough.  Does not have Albuterol wtih him.  Currently in [place] returning home 7/19 or 10/09/12.  Reviewed Health History In EMR: Yes  Reviewed Medications In EMR: Yes  Reviewed Allergies In EMR: Yes  Reviewed Surgeries / Procedures: Yes  Date of Onset of Symptoms: 10/02/2012  Treatments Tried: tried to > sleep  Treatments Tried Worked: Yes  Guideline(s) Used:  Chest Pain  Dizziness  Disposition Per Guideline:   See Today in Office  Reason For Disposition Reached:   Patient wants to be seen  Advice Given:  N/A  RN Overrode Recommendation:  Go To [facility] Within 4 hours per nursing judgement.  Out of town; cardiac risk factors.

## 2012-10-07 NOTE — Telephone Encounter (Signed)
UC Notification 

## 2012-10-10 ENCOUNTER — Ambulatory Visit (INDEPENDENT_AMBULATORY_CARE_PROVIDER_SITE_OTHER): Payer: Medicare Other | Admitting: Family Medicine

## 2012-10-10 ENCOUNTER — Ambulatory Visit: Payer: Medicare Other | Admitting: Internal Medicine

## 2012-10-10 ENCOUNTER — Encounter: Payer: Self-pay | Admitting: Family Medicine

## 2012-10-10 VITALS — BP 110/66 | HR 83 | Temp 98.2°F | Wt 193.0 lb

## 2012-10-10 DIAGNOSIS — N189 Chronic kidney disease, unspecified: Secondary | ICD-10-CM

## 2012-10-10 DIAGNOSIS — E871 Hypo-osmolality and hyponatremia: Secondary | ICD-10-CM | POA: Diagnosis not present

## 2012-10-10 DIAGNOSIS — B356 Tinea cruris: Secondary | ICD-10-CM

## 2012-10-10 DIAGNOSIS — J329 Chronic sinusitis, unspecified: Secondary | ICD-10-CM | POA: Diagnosis not present

## 2012-10-10 DIAGNOSIS — J309 Allergic rhinitis, unspecified: Secondary | ICD-10-CM | POA: Diagnosis not present

## 2012-10-10 MED ORDER — AMOXICILLIN 875 MG PO TABS: 875.0000 mg | ORAL_TABLET | Freq: Two times a day (BID) | ORAL | Status: DC

## 2012-10-10 MED ORDER — FLUCONAZOLE 150 MG PO TABS: 150.0000 mg | ORAL_TABLET | Freq: Once | ORAL | Status: DC

## 2012-10-10 NOTE — Progress Notes (Signed)
Chief Complaint  Patient presents with  . post urgent care follow up    low sodium; per "whoozie" feeling, weakness  . Sinusitis    HPI:  72 yo M pt of Dr. Lovell Sheehan with PMH Anxiety, depression, IBs, GERD, CAD (followed by cardiology), HLD, HTN here for follow up after being seen in urgent care for mild SOB and "feeling poorly" on 10/07/12: -reports: started having sinus issues about 8 days ago, sinus congestion, sinus pressure bilat, poor appetite, fatigue, not sleeping well - hx of this in the past with sinusitis - reports seen in [facility] and told to f/u with PCP to recheck kidneys and sodium -lab report CBC ok and with sodium 131, Cr 1.47 reviewed - Cr. 1.37 last check in 05/2012 which is lower then in the past - baseline appears to be around 1.4 -denies: CP, SOB, wheezing, fevers, tooth pain -hx sinus issues, allergies, sinusitis, sinus surgery - followed by allergist and has seen ENT in the past    ROS: See pertinent positives and negatives per HPI.  Past Medical History  Diagnosis Date  . Asthma   . CAD (coronary artery disease)     remote CABG 2001  . Depression   . GERD (gastroesophageal reflux disease)   . Hyperlipidemia   . IBS (irritable bowel syndrome)   . Acute medial meniscal tear   . DJD (degenerative joint disease)   . Hypertension   . Anemia   . Diverticulosis   . Obesity   . Pancreatitis   . PUD (peptic ulcer disease)   . Pneumonia   . Sleep apnea   . Prostate cancer   . Acute sinusitis 12/14/2006    Qualifier: Diagnosis of  By: Lovell Sheehan MD, Balinda Quails     Family History  Problem Relation Age of Onset  . Heart disease Father     History   Social History  . Marital Status: Married    Spouse Name: N/A    Number of Children: N/A  . Years of Education: N/A   Occupational History  . retired    Social History Main Topics  . Smoking status: Former Smoker    Quit date: 06/16/1992  . Smokeless tobacco: None     Comment: smoked 1ppd for 35 years  . Alcohol  Use: No  . Drug Use: No  . Sexually Active: Yes   Other Topics Concern  . None   Social History Narrative  . None    Current outpatient prescriptions:aspirin 81 MG tablet, Take 81 mg by mouth daily.  , Disp: , Rfl: ;  beclomethasone (QVAR) 80 MCG/ACT inhaler, Inhale 1 puff into the lungs 2 (two) times daily., Disp: , Rfl: ;  desloratadine (CLARINEX) 5 MG tablet, Take 5 mg by mouth daily.  , Disp: , Rfl: ;  diazepam (VALIUM) 5 MG tablet, Take 1 tablet (5 mg total) by mouth at bedtime as needed for anxiety or sleep., Disp: 30 tablet, Rfl: 3 fluticasone (FLONASE) 50 MCG/ACT nasal spray, 2 sprays  Each nostril qd, Disp: 16 g, Rfl: 11;  folic acid (FOLVITE) 1 MG tablet, Take 1 mg by mouth daily.  , Disp: , Rfl: ;  LAC-HYDRIN FIVE 5 % LOTN lotion, APPLY TO AFFECTED AREA(S) TWICE DAILY, Disp: 120 mL, Rfl: 2;  nitroGLYCERIN (NITROSTAT) 0.4 MG SL tablet, Place 0.4 mg under the tongue every 5 (five) minutes as needed.  , Disp: , Rfl:  nystatin cream (MYCOSTATIN), Apply topically 2 (two) times daily., Disp: , Rfl: ;  olmesartan-hydrochlorothiazide (BENICAR HCT) 20-12.5 MG per tablet, , Disp: , Rfl: ;  omeprazole (PRILOSEC) 20 MG capsule, Take 20 mg by mouth daily. , Disp: , Rfl: ;  ondansetron (ZOFRAN) 8 MG tablet, Take 1 tablet (8 mg total) by mouth every 8 (eight) hours as needed for nausea., Disp: 20 tablet, Rfl: 0 polyethylene glycol (MIRALAX / GLYCOLAX) packet, Take 17 g by mouth daily.  , Disp: , Rfl: ;  Probiotic Product (ALIGN) 4 MG CAPS, Take 1 tablet by mouth daily. , Disp: , Rfl: ;  rosuvastatin (CRESTOR) 10 MG tablet, , Disp: , Rfl: ;  vitamin B-12 (CYANOCOBALAMIN) 100 MCG tablet, Take 100 mcg by mouth daily. , Disp: , Rfl: ;  amoxicillin (AMOXIL) 875 MG tablet, Take 1 tablet (875 mg total) by mouth 2 (two) times daily., Disp: 20 tablet, Rfl: 0 fluconazole (DIFLUCAN) 150 MG tablet, Take 1 tablet (150 mg total) by mouth once., Disp: 1 tablet, Rfl: 0;  Ginger, Zingiber officinalis, (GINGER PO), Take 1  tablet by mouth 2 (two) times daily., Disp: , Rfl:   EXAM:  Filed Vitals:   10/10/12 1342  BP: 110/66  Pulse: 83  Temp: 98.2 F (36.8 C)    Body mass index is 30.69 kg/(m^2).  GENERAL: vitals reviewed and listed above, alert, oriented, appears well hydrated and in no acute distress  HEENT: atraumatic, conjunttiva clear, no obvious abnormalities on inspection of external nose and ears, normal appearance of ear canals and TMs, white nasal congestion L >R, mild post oropharyngeal erythema with PND, no tonsillar edema or exudate, L max sinus TTP  NECK: no obvious masses on inspection  LUNGS: clear to auscultation bilaterally, no wheezes, rales or rhonchi, good air movement  CV: HRRR, no peripheral edema  MS: moves all extremities without noticeable abnormality  PSYCH: pleasant and cooperative, no obvious depression or anxiety  ASSESSMENT AND PLAN:  Discussed the following assessment and plan:  Sinusitis - Plan: amoxicillin (AMOXIL) 875 MG tablet, fluconazole (DIFLUCAN) 150 MG tablet -symptoms likely related to sinusitis per hx and exam -tx with abx he tolerated in past as reports has not tolerated other abx -advised if persistent or recurrent sinus issues should see his ENT doctor, cont follow up with allergist -return precuations  Jock itch - Plan: fluconazole (DIFLUCAN) 150 MG tablet -mentioned this as leaving, topical help but then recurred wants to try diflucan - risks discussed -advised to keep area clean and dry -follow up with PCP if persists  Renal failure, chronic, unspecified stage -Cr at baseline, follow with PCP  Hyponatremia -mild hyponatremia, seen in the past -follow with PCP  -Patient advised to return or notify a doctor immediately if symptoms worsen or persist or new concerns arise.  Patient Instructions  -As we discussed, we have prescribed a new medication (Amoxicillin) for you at this appointment. We discussed the common and serious potential  adverse effects of this medication and you can review these and more with the pharmacist when you pick up your medication.  Please follow the instructions for use carefully and notify us immediately if you have any problems taking this medication. If sinus issues return be sure to see your ear, nose and throat doctor  -follow up with your doctor as scheduled      Liliane Mallis, Dahlia Client R.

## 2012-10-10 NOTE — Patient Instructions (Addendum)
-  As we discussed, we have prescribed a new medication (Amoxicillin) for you at this appointment. We discussed the common and serious potential adverse effects of this medication and you can review these and more with the pharmacist when you pick up your medication.  Please follow the instructions for use carefully and notify us immediately if you have any problems taking this medication. If sinus issues return be sure to see your ear, nose and throat doctor  -follow up with your doctor as scheduled

## 2012-10-25 DIAGNOSIS — J309 Allergic rhinitis, unspecified: Secondary | ICD-10-CM | POA: Diagnosis not present

## 2012-10-31 ENCOUNTER — Encounter: Payer: Self-pay | Admitting: Internal Medicine

## 2012-10-31 ENCOUNTER — Telehealth: Payer: Self-pay | Admitting: Internal Medicine

## 2012-10-31 ENCOUNTER — Ambulatory Visit (INDEPENDENT_AMBULATORY_CARE_PROVIDER_SITE_OTHER): Payer: Medicare Other | Admitting: Internal Medicine

## 2012-10-31 DIAGNOSIS — M545 Low back pain, unspecified: Secondary | ICD-10-CM | POA: Diagnosis not present

## 2012-10-31 DIAGNOSIS — M76899 Other specified enthesopathies of unspecified lower limb, excluding foot: Secondary | ICD-10-CM | POA: Diagnosis not present

## 2012-10-31 DIAGNOSIS — I10 Essential (primary) hypertension: Secondary | ICD-10-CM | POA: Diagnosis not present

## 2012-10-31 NOTE — Patient Instructions (Addendum)

## 2012-10-31 NOTE — Progress Notes (Signed)
Subjective:    Patient ID: Joshua [name], male    DOB: 01-31-1941, 72 y.o.   MRN: [id number]  HPI  72 year old patient who is seen today with a chief complaint of left hip pain. This has been present for about 2 months for the past one week it has worsened and he also describes a burning sensation involving the left lateral leg to the level of the knee. He is quite active physically and does to a health club. He does exercise including squats and stretching of the lower extremities.  Past Medical History  Diagnosis Date  . Asthma   . CAD (coronary artery disease)     remote CABG 2001  . Depression   . GERD (gastroesophageal reflux disease)   . Hyperlipidemia   . IBS (irritable bowel syndrome)   . Acute medial meniscal tear   . DJD (degenerative joint disease)   . Hypertension   . Anemia   . Diverticulosis   . Obesity   . Pancreatitis   . PUD (peptic ulcer disease)   . Pneumonia   . Sleep apnea   . Prostate cancer   . Acute sinusitis 12/14/2006    Qualifier: Diagnosis of  By: Lovell Sheehan MD, Balinda Quails     History   Social History  . Marital Status: Married    Spouse Name: N/A    Number of Children: N/A  . Years of Education: N/A   Occupational History  . retired    Social History Main Topics  . Smoking status: Former Smoker    Quit date: 06/16/1992  . Smokeless tobacco: Not on file     Comment: smoked 1ppd for 35 years  . Alcohol Use: No  . Drug Use: No  . Sexually Active: Yes   Other Topics Concern  . Not on file   Social History Narrative  . No narrative on file    Past Surgical History  Procedure Laterality Date  . Thyroidectomy, partial    . Coronary artery bypass graft    . Cholecystectomy    . Cervical fusion    . Cervical laminectomy  05/26/06  . Arthroscopy rt knee  04/2008    daldoerf    Family History  Problem Relation Age of Onset  . Heart disease Father     Allergies  Allergen Reactions  . Azithromycin     Dizziness and double vision    . Bromfed     REACTION: n\T\v  . Clarithromycin     REACTION: gastritis  . Codeine   . Doxycycline Hyclate     REACTION: n\T\v  . Erythromycin Base     REACTION: rash  . Hydrocodone-Acetaminophen     REACTION: rash  . Modafinil     REACTION: anxiety-nervousness  . Promethazine Hcl     REACTION: fainting  . Sulfonamide Derivatives     REACTION: rash  . Telithromycin     REACTION: nausea    Current Outpatient Prescriptions on File Prior to Visit  Medication Sig Dispense Refill  . aspirin 81 MG tablet Take 81 mg by mouth daily.        . beclomethasone (QVAR) 80 MCG/ACT inhaler Inhale 1 puff into the lungs 2 (two) times daily.      [name] desloratadine (CLARINEX) 5 MG tablet Take 5 mg by mouth daily.        . diazepam (VALIUM) 5 MG tablet Take 1 tablet (5 mg total) by mouth at bedtime as needed for anxiety or sleep.  30 tablet  3  . fluticasone (FLONASE) 50 MCG/ACT nasal spray 2 sprays  Each nostril qd  16 g  11  . folic acid (FOLVITE) 1 MG tablet Take 1 mg by mouth daily.        [name] LAC-HYDRIN FIVE 5 % LOTN lotion APPLY TO AFFECTED AREA(S) TWICE DAILY  120 mL  2  . nitroGLYCERIN (NITROSTAT) 0.4 MG SL tablet Place 0.4 mg under the tongue every 5 (five) minutes as needed.        . nystatin cream (MYCOSTATIN) Apply topically 2 (two) times daily.      [name] olmesartan-hydrochlorothiazide (BENICAR HCT) 20-12.5 MG per tablet       . omeprazole (PRILOSEC) 20 MG capsule Take 20 mg by mouth daily.       . ondansetron (ZOFRAN) 8 MG tablet Take 1 tablet (8 mg total) by mouth every 8 (eight) hours as needed for nausea.  20 tablet  0  . polyethylene glycol (MIRALAX / GLYCOLAX) packet Take 17 g by mouth daily.        . Probiotic Product (ALIGN) 4 MG CAPS Take 1 tablet by mouth daily.       . rosuvastatin (CRESTOR) 10 MG tablet       . vitamin B-12 (CYANOCOBALAMIN) 100 MCG tablet Take 500 mcg by mouth daily.       . Ginger, Zingiber officinalis, (GINGER PO) Take 1 tablet by mouth 2 (two) times daily.        No current facility-administered medications on file prior to visit.    BP 132/80  Pulse 80  Temp(Src) 98.9 F (37.2 C) (Oral)  Resp 20  Wt 196 lb (88.905 kg)  BMI 31.16 kg/m2  SpO2 97%       Review of Systems  Musculoskeletal: Positive for arthralgias.       Objective:   Physical Exam  Constitutional: He appears well-developed and well-nourished. No distress.  Musculoskeletal:  Range of motion of the left hip appear to be fairly intact. Internal rotation the hip the cost of some very mild discomfort. There was some mild tenderness over the left greater trochanter          Assessment & Plan:   Left greater trochanteric bursitis  Information was dispensed including stretching exercises  Procedure note.  After prep with alcohol swabbing and local anesthesia, 1 cc (40 mg) of Depo-Medrol injected about the left greater trochanteric  bursa. Patient tolerated the procedure well;  a Band-Aid placed

## 2012-10-31 NOTE — Telephone Encounter (Signed)
Appointment made with Dr K 

## 2012-10-31 NOTE — Telephone Encounter (Signed)
Patient Information:  Caller Name: Curren  Phone: [phone]  Patient: Diarra, Kos  Gender: Male  DOB: 10-Jan-1941  Age: 72 Years  PCP: Darryll Capers (Adults only)  Office Follow Up:  Does the office need to follow up with this patient?: Yes  Instructions For The Office: Urgent call back needed; requests office appointment instead of ED.  RN Note:  Constant, upper left leg pain rated 5-6/10 and 8-9 /10 when walking or when lying on left side. Declined referral to ED; requests appointment with MD.  Urgent message sent to office nurse for call back.  Symptoms  Reason For Call & Symptoms: Left, lateral upper leg pain from hip to below knee.  Limping. Painful to move leg.  No back pain.  Reviewed Health History In EMR: Yes  Reviewed Medications In EMR: Yes  Reviewed Allergies In EMR: Yes  Reviewed Surgeries / Procedures: Yes  Date of Onset of Symptoms: 10/26/2012  Treatments Tried: ice pack, Biofreeze,  Treatments Tried Worked: No  Guideline(s) Used:  Leg Pain  Disposition Per Guideline:   Go to ED Now (or to Office with PCP Approval)  Reason For Disposition Reached:   Thigh or calf pain in only one leg and present > 1 hour  Advice Given:  Call Back If:  You become worse.  Patient Refused Recommendation:  Patient Will Follow Up With Office Later  Refused to go to ED; requests to see MD in office.  Please call back ASAP to advise if approved for office visit.

## 2012-11-01 ENCOUNTER — Telehealth: Payer: Self-pay | Admitting: Internal Medicine

## 2012-11-01 NOTE — Telephone Encounter (Signed)
Spoke to the pt.  See other message.

## 2012-11-01 NOTE — Telephone Encounter (Signed)
Spoke to Dr. Kirtland Bouchard.  Pt does not need to report for another appt.  Dr. Kirtland Bouchard offered Tramadol but pt declined.  Did accept ortho referral.  Pt is established with Dr. Prentice Docker.  No referral needed.  He will call to make appt and if there are any problems he will call back.

## 2012-11-01 NOTE — Telephone Encounter (Signed)
Patient Information:  Caller Name: Aristotelis  Phone: [phone]  Patient: Joshua [name], Joshua [name]  Gender: Male  DOB: [birth year]  Age: 72 Years  PCP: Darryll Capers (Adults only)  Office Follow Up:  Does the office need to follow up with this patient?: No  Instructions For The Office: N/A  RN Note:  Triaged in Hip Non-Injury Pain - Disposition:  See provider within 24 hours due to persistanct or worsening pain even when following prescribed treatment.   Appt tomorrow Wed 8/13 at 10:15 am.  Symptoms  Reason For Call & Symptoms: Left hip pain started Wed 8/6 after washing and waxing car.  Seen in office yesterday 8/11 - Dx bursitis and given Cortisone shot in hjip.  By night pain moving  toward buttock area, now that area tender and sore, feels pain worse since treatment.  Taking Tramadol nad Valium during the night.  Increased pain when lyig down.  Reviewed Health History In EMR: Yes  Reviewed Medications In EMR: Yes  Reviewed Allergies In EMR: Yes  Reviewed Surgeries / Procedures: Yes  Date of Onset of Symptoms: 10/26/2012  Treatments Tried: Tramadol, Valium, applied ice  Treatments Tried Worked: No  Guideline(s) Used:  No Protocol Available - Sick Adult  Disposition Per Guideline:   See Today or Tomorrow in Office  Reason For Disposition Reached:   Nursing judgment  Advice Given:  Call Back If:  You become worse.  Patient Will Follow Care Advice:  YES  Appointment Scheduled:  11/02/2012 10:15:00 Appointment Scheduled Provider:  Eleonore Chiquito [facility])

## 2012-11-01 NOTE — Telephone Encounter (Signed)
Caller: [name]/Patient; Phone: [phone]; Reason for Call: Pt remembered the name of the Orthopedic physician he is requesting a referral too.  Please call back to speak with pt.

## 2012-11-02 ENCOUNTER — Ambulatory Visit: Payer: Self-pay | Admitting: Internal Medicine

## 2012-11-07 DIAGNOSIS — M545 Low back pain, unspecified: Secondary | ICD-10-CM | POA: Diagnosis not present

## 2012-11-07 DIAGNOSIS — J309 Allergic rhinitis, unspecified: Secondary | ICD-10-CM | POA: Diagnosis not present

## 2012-11-07 DIAGNOSIS — M76899 Other specified enthesopathies of unspecified lower limb, excluding foot: Secondary | ICD-10-CM | POA: Diagnosis not present

## 2012-11-09 ENCOUNTER — Encounter: Payer: Self-pay | Admitting: Internal Medicine

## 2012-11-09 ENCOUNTER — Ambulatory Visit (INDEPENDENT_AMBULATORY_CARE_PROVIDER_SITE_OTHER): Payer: Medicare Other | Admitting: Internal Medicine

## 2012-11-09 VITALS — BP 140/90 | HR 76 | Temp 98.2°F | Resp 16 | Ht 66.5 in | Wt 196.0 lb

## 2012-11-09 DIAGNOSIS — R5381 Other malaise: Secondary | ICD-10-CM | POA: Diagnosis not present

## 2012-11-09 DIAGNOSIS — I10 Essential (primary) hypertension: Secondary | ICD-10-CM | POA: Diagnosis not present

## 2012-11-09 DIAGNOSIS — F32A Depression, unspecified: Secondary | ICD-10-CM

## 2012-11-09 DIAGNOSIS — R413 Other amnesia: Secondary | ICD-10-CM

## 2012-11-09 DIAGNOSIS — D509 Iron deficiency anemia, unspecified: Secondary | ICD-10-CM

## 2012-11-09 DIAGNOSIS — K5900 Constipation, unspecified: Secondary | ICD-10-CM | POA: Diagnosis not present

## 2012-11-09 DIAGNOSIS — J45909 Unspecified asthma, uncomplicated: Secondary | ICD-10-CM

## 2012-11-09 DIAGNOSIS — F329 Major depressive disorder, single episode, unspecified: Secondary | ICD-10-CM

## 2012-11-09 LAB — CBC WITH DIFFERENTIAL/PLATELET
Basophils Absolute: 0 10*3/uL (ref 0.0–0.1)
Basophils Relative: 0 % (ref 0.0–3.0)
Eosinophils Absolute: 0 10*3/uL (ref 0.0–0.7)
Eosinophils Relative: 0 % (ref 0.0–5.0)
HCT: 44.1 % (ref 39.0–52.0)
Hemoglobin: 15.3 g/dL (ref 13.0–17.0)
Lymphocytes Relative: 7.1 % — ABNORMAL LOW (ref 12.0–46.0)
Lymphs Abs: 0.8 10*3/uL (ref 0.7–4.0)
MCHC: 34.7 g/dL (ref 30.0–36.0)
MCV: 90.6 fl (ref 78.0–100.0)
Monocytes Absolute: 0.5 10*3/uL (ref 0.1–1.0)
Monocytes Relative: 4.7 % (ref 3.0–12.0)
Neutro Abs: 9.9 10*3/uL — ABNORMAL HIGH (ref 1.4–7.7)
Neutrophils Relative %: 88.2 % — ABNORMAL HIGH (ref 43.0–77.0)
Platelets: 238 10*3/uL (ref 150.0–400.0)
RBC: 4.86 Mil/uL (ref 4.22–5.81)
RDW: 12.7 % (ref 11.5–14.6)
WBC: 11.3 10*3/uL — ABNORMAL HIGH (ref 4.5–10.5)

## 2012-11-09 LAB — BASIC METABOLIC PANEL
BUN: 18 mg/dL (ref 6–23)
CO2: 28 mEq/L (ref 19–32)
Calcium: 9.6 mg/dL (ref 8.4–10.5)
Chloride: 94 mEq/L — ABNORMAL LOW (ref 96–112)
Creatinine, Ser: 1.3 mg/dL (ref 0.4–1.5)
GFR: 60.18 mL/min (ref >60.00)
Glucose, Bld: 116 mg/dL — ABNORMAL HIGH (ref 70–99)
Potassium: 4.6 mEq/L (ref 3.5–5.1)
Sodium: 128 mEq/L — ABNORMAL LOW (ref 135–145)

## 2012-11-09 LAB — TSH: TSH: 0.37 u[IU]/mL (ref 0.35–5.50)

## 2012-11-09 MED ORDER — ESCITALOPRAM OXALATE 10 MG PO TABS: 10.0000 mg | ORAL_TABLET | Freq: Every day | ORAL | Status: DC

## 2012-11-09 NOTE — Patient Instructions (Signed)
The patient is instructed to continue all medications as prescribed. Schedule followup with check out clerk upon leaving the clinic  

## 2012-11-09 NOTE — Progress Notes (Signed)
Subjective:    Patient ID: Joshua [name], male    DOB: March 13, 1941, 72 y.o.   MRN: [id number]  HPI Sent is a pleasant 72 year old male is followed for hypertension hyperlipidemia and recent onset of back pain for which she was referred to an orthopedist and he is now on a prednisone Dosepak despite the prednisone Dosepak he is experiencing fatigue hypersomnolence and a change in his normal energy levels and behavior patterns.  He does have a history of episodic depression he does have a history of anemia and all of these could be contributing factors and should be ruled out   Review of Systems  Constitutional: Negative for fever and fatigue.  HENT: Negative for hearing loss, congestion, neck pain and postnasal drip.   Eyes: Negative for discharge, redness and visual disturbance.  Respiratory: Negative for cough, shortness of breath and wheezing.   Cardiovascular: Negative for leg swelling.  Gastrointestinal: Negative for abdominal pain, constipation and abdominal distention.  Genitourinary: Negative for urgency and frequency.  Musculoskeletal: Negative for joint swelling and arthralgias.  Skin: Negative for color change and rash.  Neurological: Negative for weakness and light-headedness.  Hematological: Negative for adenopathy.  Psychiatric/Behavioral: Positive for dysphoric mood. Negative for behavioral problems. The patient is nervous/anxious.        Past Medical History  Diagnosis Date  . Asthma   . CAD (coronary artery disease)     remote CABG 2001  . Depression   . GERD (gastroesophageal reflux disease)   . Hyperlipidemia   . IBS (irritable bowel syndrome)   . Acute medial meniscal tear   . DJD (degenerative joint disease)   . Hypertension   . Anemia   . Diverticulosis   . Obesity   . Pancreatitis   . PUD (peptic ulcer disease)   . Pneumonia   . Sleep apnea   . Prostate cancer   . Acute sinusitis 12/14/2006    Qualifier: Diagnosis of  By: Lovell Sheehan MD, Balinda Quails      History   Social History  . Marital Status: Married    Spouse Name: N/A    Number of Children: N/A  . Years of Education: N/A   Occupational History  . retired    Social History Main Topics  . Smoking status: Former Smoker    Quit date: 06/16/1992  . Smokeless tobacco: Not on file     Comment: smoked 1ppd for 35 years  . Alcohol Use: No  . Drug Use: No  . Sexual Activity: Yes   Other Topics Concern  . Not on file   Social History Narrative  . No narrative on file    Past Surgical History  Procedure Laterality Date  . Thyroidectomy, partial    . Coronary artery bypass graft    . Cholecystectomy    . Cervical fusion    . Cervical laminectomy  05/26/06  . Arthroscopy rt knee  04/2008    daldoerf    Family History  Problem Relation Age of Onset  . Heart disease Father     Allergies  Allergen Reactions  . Azithromycin     Dizziness and double vision   . Bromfed     REACTION: n\T\v  . Clarithromycin     REACTION: gastritis  . Codeine   . Doxycycline Hyclate     REACTION: n\T\v  . Erythromycin Base     REACTION: rash  . Hydrocodone-Acetaminophen     REACTION: rash  . Modafinil     REACTION: anxiety-nervousness  .  Promethazine Hcl     REACTION: fainting  . Sulfonamide Derivatives     REACTION: rash  . Telithromycin     REACTION: nausea    Current Outpatient Prescriptions on File Prior to Visit  Medication Sig Dispense Refill  . aspirin 81 MG tablet Take 81 mg by mouth daily.        . beclomethasone (QVAR) 80 MCG/ACT inhaler Inhale 1 puff into the lungs 2 (two) times daily.      . Coenzyme Q10 (CO Q 10 PO) Take 1 tablet by mouth daily.      [name] desloratadine (CLARINEX) 5 MG tablet Take 5 mg by mouth daily.        . diazepam (VALIUM) 5 MG tablet Take 1 tablet (5 mg total) by mouth at bedtime as needed for anxiety or sleep.  30 tablet  3  . fluticasone (FLONASE) 50 MCG/ACT nasal spray 2 sprays  Each nostril qd  16 g  11  . folic acid (FOLVITE) 1 MG  tablet Take 1 mg by mouth daily.        . Ginger, Zingiber officinalis, (GINGER PO) Take 1 tablet by mouth 2 (two) times daily.      . Ginkgo Biloba 40 MG TABS Take 120 mg by mouth daily.      [name] LAC-HYDRIN FIVE 5 % LOTN lotion APPLY TO AFFECTED AREA(S) TWICE DAILY  120 mL  2  . nitroGLYCERIN (NITROSTAT) 0.4 MG SL tablet Place 0.4 mg under the tongue every 5 (five) minutes as needed.        . nystatin cream (MYCOSTATIN) Apply topically 2 (two) times daily.      [name] olmesartan-hydrochlorothiazide (BENICAR HCT) 20-12.5 MG per tablet       . omeprazole (PRILOSEC) 20 MG capsule Take 20 mg by mouth daily.       . ondansetron (ZOFRAN) 8 MG tablet Take 1 tablet (8 mg total) by mouth every 8 (eight) hours as needed for nausea.  20 tablet  0  . polyethylene glycol (MIRALAX / GLYCOLAX) packet Take 17 g by mouth daily.        . Probiotic Product (ALIGN) 4 MG CAPS Take 1 tablet by mouth daily.       . Rhodiola rosea (RHODIOLA PO) Take 500 mg by mouth daily.      . rosuvastatin (CRESTOR) 10 MG tablet       . vitamin B-12 (CYANOCOBALAMIN) 100 MCG tablet Take 500 mcg by mouth daily.        No current facility-administered medications on file prior to visit.    BP 140/90  Pulse 76  Temp(Src) 98.2 F (36.8 C)  Resp 16  Ht 5' 6.5" (1.689 m)  Wt 196 lb (88.905 kg)  BMI 31.16 kg/m2    Objective:   Physical Exam  Nursing note and vitals reviewed. Constitutional: He appears well-developed and well-nourished.  HENT:  Head: Normocephalic and atraumatic.  Eyes: Conjunctivae are normal. Pupils are equal, round, and reactive to light.  Neck: Normal range of motion. Neck supple.  Cardiovascular: Normal rate and regular rhythm.   Pulmonary/Chest: Effort normal and breath sounds normal.  Abdominal: Soft. Bowel sounds are normal.          Assessment & Plan:  Appropriate screening for known chronic problems including history of anemia thyroid and a basic metabolic panel.  Trial of Lexapro 10 mg by  mouth daily.  Continued review of physical therapy at present dose pack and back pain to make sure  the radicular pain results

## 2012-11-11 DIAGNOSIS — M545 Low back pain, unspecified: Secondary | ICD-10-CM | POA: Diagnosis not present

## 2012-11-11 DIAGNOSIS — M76899 Other specified enthesopathies of unspecified lower limb, excluding foot: Secondary | ICD-10-CM | POA: Diagnosis not present

## 2012-11-14 ENCOUNTER — Encounter: Payer: Self-pay | Admitting: Family Medicine

## 2012-11-14 ENCOUNTER — Telehealth: Payer: Self-pay | Admitting: Internal Medicine

## 2012-11-14 ENCOUNTER — Ambulatory Visit (INDEPENDENT_AMBULATORY_CARE_PROVIDER_SITE_OTHER): Payer: Medicare Other | Admitting: Family Medicine

## 2012-11-14 VITALS — BP 122/80 | Temp 97.7°F | Wt 194.0 lb

## 2012-11-14 DIAGNOSIS — T788XXS Other adverse effects, not elsewhere classified, sequela: Secondary | ICD-10-CM | POA: Diagnosis not present

## 2012-11-14 DIAGNOSIS — T7589XS Other specified effects of external causes, sequela: Secondary | ICD-10-CM

## 2012-11-14 DIAGNOSIS — F3289 Other specified depressive episodes: Secondary | ICD-10-CM

## 2012-11-14 DIAGNOSIS — R5381 Other malaise: Secondary | ICD-10-CM

## 2012-11-14 DIAGNOSIS — T7840XS Allergy, unspecified, sequela: Secondary | ICD-10-CM

## 2012-11-14 DIAGNOSIS — F329 Major depressive disorder, single episode, unspecified: Secondary | ICD-10-CM | POA: Diagnosis not present

## 2012-11-14 NOTE — Progress Notes (Addendum)
Chief Complaint  Patient presents with  . chronic fatigue    HPI:  Joshua [name] here for follow up for chronic fatigue: -seen by PCP 5 days ago for this and labs done and started on lexapro for depression -on steroid for hip issues and this is causing some sleep issue - took lunesta for sleep but didn't agree with him well -thinks lexapro interacted with prednisone (caused more fatigue) so stopped it and called nurse who told him needed to be seen -denies change in symptoms since being seen - anxiety, depressed mood, fatigue are the same -denies: CP, fevers, chills, SI, thoughts of self harm -has had some vision issues on and off since June - with far vision, has eye doctor - hasn't seen him for this -takes sleep aid prescribed by PCP and this causes residual daytime sleepiness  -a little nasal congestion and mild SOB occ - like when he is getting sinus issues/cold  ROS: See pertinent positives and negatives per HPI.  Past Medical History  Diagnosis Date  . Asthma   . CAD (coronary artery disease)     remote CABG 2001  . Depression   . GERD (gastroesophageal reflux disease)   . Hyperlipidemia   . IBS (irritable bowel syndrome)   . Acute medial meniscal tear   . DJD (degenerative joint disease)   . Hypertension   . Anemia   . Diverticulosis   . Obesity   . Pancreatitis   . PUD (peptic ulcer disease)   . Pneumonia   . Sleep apnea   . Prostate cancer   . Acute sinusitis 12/14/2006    Qualifier: Diagnosis of  By: Lovell Sheehan MD, Balinda Quails     Family History  Problem Relation Age of Onset  . Heart disease Father     History   Social History  . Marital Status: Married    Spouse Name: N/A    Number of Children: N/A  . Years of Education: N/A   Occupational History  . retired    Social History Main Topics  . Smoking status: Former Smoker    Quit date: 06/16/1992  . Smokeless tobacco: None     Comment: smoked 1ppd for 35 years  . Alcohol Use: No  . Drug Use: No   . Sexual Activity: Yes   Other Topics Concern  . None   Social History Narrative  . None    Current outpatient prescriptions:aspirin 81 MG tablet, Take 81 mg by mouth daily.  , Disp: , Rfl: ;  beclomethasone (QVAR) 80 MCG/ACT inhaler, Inhale 1 puff into the lungs 2 (two) times daily., Disp: , Rfl: ;  Coenzyme Q10 (CO Q 10 PO), Take 1 tablet by mouth daily., Disp: , Rfl: ;  desloratadine (CLARINEX) 5 MG tablet, Take 5 mg by mouth daily.  , Disp: , Rfl:  diazepam (VALIUM) 5 MG tablet, Take 1 tablet (5 mg total) by mouth at bedtime as needed for anxiety or sleep., Disp: 30 tablet, Rfl: 3;  escitalopram (LEXAPRO) 10 MG tablet, Take 1 tablet (10 mg total) by mouth daily., Disp: 30 tablet, Rfl: 4;  fluticasone (FLONASE) 50 MCG/ACT nasal spray, 2 sprays  Each nostril qd, Disp: 16 g, Rfl: 11;  folic acid (FOLVITE) 1 MG tablet, Take 1 mg by mouth daily.  , Disp: , Rfl:  Ginger, Zingiber officinalis, (GINGER PO), Take 1 tablet by mouth 2 (two) times daily., Disp: , Rfl: ;  Ginkgo Biloba 40 MG TABS, Take 120 mg by mouth  daily., Disp: , Rfl: ;  LAC-HYDRIN FIVE 5 % LOTN lotion, APPLY TO AFFECTED AREA(S) TWICE DAILY, Disp: 120 mL, Rfl: 2;  nitroGLYCERIN (NITROSTAT) 0.4 MG SL tablet, Place 0.4 mg under the tongue every 5 (five) minutes as needed.  , Disp: , Rfl:  nystatin cream (MYCOSTATIN), Apply topically 2 (two) times daily., Disp: , Rfl: ;  olmesartan-hydrochlorothiazide (BENICAR HCT) 20-12.5 MG per tablet, , Disp: , Rfl: ;  omeprazole (PRILOSEC) 20 MG capsule, Take 20 mg by mouth daily. , Disp: , Rfl: ;  ondansetron (ZOFRAN) 8 MG tablet, Take 1 tablet (8 mg total) by mouth every 8 (eight) hours as needed for nausea., Disp: 20 tablet, Rfl: 0 polyethylene glycol (MIRALAX / GLYCOLAX) packet, Take 17 g by mouth daily.  , Disp: , Rfl: ;  Probiotic Product (ALIGN) 4 MG CAPS, Take 1 tablet by mouth daily. , Disp: , Rfl: ;  Rhodiola rosea (RHODIOLA PO), Take 500 mg by mouth daily., Disp: , Rfl: ;  rosuvastatin  (CRESTOR) 10 MG tablet, , Disp: , Rfl: ;  vitamin B-12 (CYANOCOBALAMIN) 100 MCG tablet, Take 500 mcg by mouth daily. , Disp: , Rfl:  predniSONE (STERAPRED UNI-PAK) 10 MG tablet, Take 10 mg by mouth daily., Disp: , Rfl:   EXAM:  Filed Vitals:   11/14/12 1017  BP: 122/80  Temp: 97.7 F (36.5 C)    Body mass index is 30.85 kg/(m^2).  GENERAL: vitals reviewed and listed above, alert, oriented, appears well hydrated and in no acute distress  HEENT: atraumatic, conjunttiva clear, visual acuity grossly intact, no obvious abnormalities on inspection of external nose and ears, normal appearance of ear canals and TMs, clear nasal congestion, mild post oropharyngeal erythema with PND, no tonsillar edema or exudate, no sinus TTP  NECK: no obvious masses on inspection  LUNGS: clear to auscultation bilaterally, no wheezes, rales or rhonchi, good air movement  CV: HRRR, no peripheral edema  MS: moves all extremities without noticeable abnormality  PSYCH: pleasant and cooperative, no obvious depression or anxiety  ASSESSMENT AND PLAN:  Discussed the following assessment and plan:  Other malaise and fatigue  Allergic reaction, sequela  DEPRESSION  -just saw PCP for same issues which he reports are chronic - thought lexapro interacting with prednisone so stopped after 1 dose -antihistamine for AR -visual acuity grossly intact, advised to see his opthomologist for exam -advised restart lexapro, discussed risks - follow up with PCP in 1 month, of course advised if any worsening symptoms, change in symptoms to be seen immediately -Patient advised to return or notify a doctor immediately if symptoms worsen or persist or new concerns arise.  There are no Patient Instructions on file for this visit.   Kriste Basque R.

## 2012-11-14 NOTE — Telephone Encounter (Signed)
Patient Information:  Caller Name: Theron  Phone: [phone]  Patient: Joshua [name], Joshua [name]  Gender: Male  DOB: [birth year]  Age: 72 Years  PCP: Darryll Capers (Adults only)  Office Follow Up:  Does the office need to follow up with this patient?: No  Instructions For The Office: N/A  RN Note:  Ambulatory. Nocturia. Continues to have insomnia. Mild difficulty breathing "like there is something in there."  No cough.  Mild rhinorrhea present. No appointments remain with Dr Lovell Sheehan.  Needs at least 25-30 minutes to get to office.   Symptoms  Reason For Call & Symptoms: Fatigue since June and recent eye swelling since 11/11/12   Seen 11/09/12. Started recently on Lexipro.  Orthopod started Prednisone dosepak that ended 11/13/12. Took Lunesta 11/10/12 for insomnia with residual daytime drowsiness.  Stopped Lexipro 11/12/12 due to fatigue and blurred vision.  Reviewed Health History In EMR: Yes  Reviewed Medications In EMR: Yes  Reviewed Allergies In EMR: Yes  Reviewed Surgeries / Procedures: Yes  Date of Onset of Symptoms: 11/11/2012  Treatments Tried: Stopped Lexipro  Treatments Tried Worked: Yes  Guideline(s) Used:  Weakness (Generalized) and Fatigue  Disposition Per Guideline:   Go to Office Now  Reason For Disposition Reached:   Moderate weakness (i.e., interferes with work, school, normal activities) and cause unknown  Advice Given:  Call Back If:  Unable to stand or walk  Passes out  Breathing difficulty occurs  You become worse.  Patient Will Follow Care Advice:  YES  Appointment Scheduled:  11/14/2012 10:15:00 Appointment Scheduled Provider:  Kriste Basque [facility])

## 2012-11-15 ENCOUNTER — Encounter (HOSPITAL_COMMUNITY): Payer: Self-pay | Admitting: Emergency Medicine

## 2012-11-15 ENCOUNTER — Emergency Department (HOSPITAL_COMMUNITY)
Admission: EM | Admit: 2012-11-15 | Discharge: 2012-11-15 | Disposition: A | Discharge status: Home / Self Care | Payer: Medicare Other | Attending: Emergency Medicine | Admitting: Emergency Medicine

## 2012-11-15 ENCOUNTER — Emergency Department (HOSPITAL_COMMUNITY): Payer: Medicare Other

## 2012-11-15 DIAGNOSIS — J45909 Unspecified asthma, uncomplicated: Secondary | ICD-10-CM | POA: Diagnosis not present

## 2012-11-15 DIAGNOSIS — Z87891 Personal history of nicotine dependence: Secondary | ICD-10-CM | POA: Diagnosis not present

## 2012-11-15 DIAGNOSIS — Z862 Personal history of diseases of the blood and blood-forming organs and certain disorders involving the immune mechanism: Secondary | ICD-10-CM | POA: Insufficient documentation

## 2012-11-15 DIAGNOSIS — K5730 Diverticulosis of large intestine without perforation or abscess without bleeding: Secondary | ICD-10-CM | POA: Diagnosis not present

## 2012-11-15 DIAGNOSIS — Z8546 Personal history of malignant neoplasm of prostate: Secondary | ICD-10-CM | POA: Insufficient documentation

## 2012-11-15 DIAGNOSIS — I10 Essential (primary) hypertension: Secondary | ICD-10-CM | POA: Diagnosis not present

## 2012-11-15 DIAGNOSIS — Z8739 Personal history of other diseases of the musculoskeletal system and connective tissue: Secondary | ICD-10-CM | POA: Insufficient documentation

## 2012-11-15 DIAGNOSIS — M47812 Spondylosis without myelopathy or radiculopathy, cervical region: Secondary | ICD-10-CM | POA: Diagnosis not present

## 2012-11-15 DIAGNOSIS — I2510 Atherosclerotic heart disease of native coronary artery without angina pectoris: Secondary | ICD-10-CM | POA: Insufficient documentation

## 2012-11-15 DIAGNOSIS — Z8719 Personal history of other diseases of the digestive system: Secondary | ICD-10-CM | POA: Diagnosis not present

## 2012-11-15 DIAGNOSIS — Z7982 Long term (current) use of aspirin: Secondary | ICD-10-CM | POA: Insufficient documentation

## 2012-11-15 DIAGNOSIS — Z8709 Personal history of other diseases of the respiratory system: Secondary | ICD-10-CM | POA: Diagnosis not present

## 2012-11-15 DIAGNOSIS — E785 Hyperlipidemia, unspecified: Secondary | ICD-10-CM | POA: Insufficient documentation

## 2012-11-15 DIAGNOSIS — E669 Obesity, unspecified: Secondary | ICD-10-CM | POA: Insufficient documentation

## 2012-11-15 DIAGNOSIS — Z8659 Personal history of other mental and behavioral disorders: Secondary | ICD-10-CM | POA: Diagnosis not present

## 2012-11-15 DIAGNOSIS — Z79899 Other long term (current) drug therapy: Secondary | ICD-10-CM | POA: Insufficient documentation

## 2012-11-15 DIAGNOSIS — M549 Dorsalgia, unspecified: Secondary | ICD-10-CM | POA: Insufficient documentation

## 2012-11-15 DIAGNOSIS — K219 Gastro-esophageal reflux disease without esophagitis: Secondary | ICD-10-CM | POA: Insufficient documentation

## 2012-11-15 DIAGNOSIS — Z87828 Personal history of other (healed) physical injury and trauma: Secondary | ICD-10-CM | POA: Diagnosis not present

## 2012-11-15 DIAGNOSIS — M542 Cervicalgia: Secondary | ICD-10-CM | POA: Diagnosis not present

## 2012-11-15 DIAGNOSIS — M5030 Other cervical disc degeneration, unspecified cervical region: Secondary | ICD-10-CM | POA: Diagnosis not present

## 2012-11-15 DIAGNOSIS — K859 Acute pancreatitis without necrosis or infection, unspecified: Secondary | ICD-10-CM | POA: Insufficient documentation

## 2012-11-15 DIAGNOSIS — K279 Peptic ulcer, site unspecified, unspecified as acute or chronic, without hemorrhage or perforation: Secondary | ICD-10-CM | POA: Insufficient documentation

## 2012-11-15 DIAGNOSIS — Z8701 Personal history of pneumonia (recurrent): Secondary | ICD-10-CM | POA: Insufficient documentation

## 2012-11-15 DIAGNOSIS — IMO0002 Reserved for concepts with insufficient information to code with codable children: Secondary | ICD-10-CM | POA: Insufficient documentation

## 2012-11-15 MED ORDER — DIAZEPAM 5 MG PO TABS: 5.0000 mg | ORAL_TABLET | Freq: Two times a day (BID) | ORAL | Status: DC

## 2012-11-15 MED ORDER — DIAZEPAM 5 MG PO TABS
5.0000 mg | ORAL_TABLET | Freq: Once | ORAL | Status: AC
  Administered 2012-11-15: 5 mg via ORAL
  Filled 2012-11-15: qty 1

## 2012-11-15 NOTE — ED Provider Notes (Signed)
Joshua [name] is a 72 y.o. male Who complains of left-sided neck pain after sleeping on it in an awkward manner last night. The pain is worse with movement. The pain radiates to the left shoulder. He denies fever or chills , paresthesias, weakness.  Exam elderly, alert, cooperative. Neck tender left lateral and left trapezius. He exhibits pain with mild movement of the neck. Strength normal in arms, bilaterally.  Assessment: Likely muscle strain, aggravated by underlying arthritis and degenerative joint disease.  Plan: Symptomatic treatment with ice, anti-inflammatory, and skeletal muscle relaxer  Medical screening examination/treatment/procedure(s) were conducted as a shared visit with non-physician practitioner(s) and myself.  I personally evaluated the patient during the encounter  Flint Melter, MD 11/16/12 [phone]

## 2012-11-15 NOTE — ED Notes (Signed)
Pt c/o neck pain that radiates down back since he woke up this morning.  Pt denies injury.  Reports hx of neck. Sts it may be related to how he slept last night.   Denies SOB or chest.

## 2012-11-15 NOTE — ED Provider Notes (Signed)
CSN: [id number]     Arrival date & time 11/15/12  1448 History   First MD Initiated Contact with Patient 11/15/12 1525     Chief Complaint  Patient presents with  . Neck Pain  . Back Pain   (Consider location/radiation/quality/duration/timing/severity/associated sxs/prior Treatment) Patient is a 72 y.o. male presenting with neck pain and back pain. The history is provided by the patient. No language interpreter was used.  Neck Pain Pain location:  L side Quality:  Cramping and stiffness Stiffness is present:  All day Pain severity:  Moderate Pain is:  Same all the time Onset quality:  Sudden Duration:  15 hours Timing:  Intermittent Progression:  Waxing and waning Chronicity:  New Ineffective treatments:  Analgesics Back Pain   Past Medical History  Diagnosis Date  . Asthma   . CAD (coronary artery disease)     remote CABG 2001  . Depression   . GERD (gastroesophageal reflux disease)   . Hyperlipidemia   . IBS (irritable bowel syndrome)   . Acute medial meniscal tear   . DJD (degenerative joint disease)   . Hypertension   . Anemia   . Diverticulosis   . Obesity   . Pancreatitis   . PUD (peptic ulcer disease)   . Pneumonia   . Sleep apnea   . Prostate cancer   . Acute sinusitis 12/14/2006    Qualifier: Diagnosis of  By: Lovell Sheehan MD, Balinda Quails    Past Surgical History  Procedure Laterality Date  . Thyroidectomy, partial    . Coronary artery bypass graft    . Cholecystectomy    . Cervical fusion    . Cervical laminectomy  05/26/06  . Arthroscopy rt knee  04/2008    [email or web address]   Family History  Problem Relation Age of Onset  . Heart disease Father    History  Substance Use Topics  . Smoking status: Former Smoker    Quit date: 06/16/1992  . Smokeless tobacco: Not on file     Comment: smoked 1ppd for 35 years  . Alcohol Use: No    Review of Systems  HENT: Positive for neck pain.   Musculoskeletal: Positive for back pain.  All other systems reviewed and are  negative.    Allergies  Azithromycin; Bromfed; Clarithromycin; Codeine; Doxycycline hyclate; Erythromycin base; Hydrocodone-acetaminophen; Modafinil; Promethazine hcl; Sulfonamide derivatives; and Telithromycin  Home Medications   Current Outpatient Rx  Name  Route  Sig  Dispense  Refill  . aspirin 81 MG tablet   Oral   Take 81 mg by mouth at bedtime.          . beclomethasone (QVAR) 80 MCG/ACT inhaler   Inhalation   Inhale 1 puff into the lungs 2 (two) times daily.         . Coenzyme Q10 (CO Q 10 PO)   Oral   Take 1 tablet by mouth daily.         [name] desloratadine (CLARINEX) 5 MG tablet   Oral   Take 5 mg by mouth at bedtime.          . diazepam (VALIUM) 5 MG tablet   Oral   Take 1 tablet (5 mg total) by mouth at bedtime as needed for anxiety or sleep.   30 tablet   3   . escitalopram (LEXAPRO) 10 MG tablet   Oral   Take 1 tablet (10 mg total) by mouth daily.   30 tablet   4   . fluticasone (FLONASE)  50 MCG/ACT nasal spray      2 sprays  Each nostril qd   16 g   11   . folic acid (FOLVITE) 1 MG tablet   Oral   Take 1 mg by mouth daily.           . Ginger, Zingiber officinalis, (GINGER PO)   Oral   Take 1 tablet by mouth 2 (two) times daily.         . Ginkgo Biloba 40 MG TABS   Oral   Take 120 mg by mouth daily.         [name] LAC-HYDRIN FIVE 5 % LOTN lotion      APPLY TO AFFECTED AREA(S) TWICE DAILY   120 mL   2   . nitroGLYCERIN (NITROSTAT) 0.4 MG SL tablet   Sublingual   Place 0.4 mg under the tongue every 5 (five) minutes as needed.           . nystatin cream (MYCOSTATIN)   Topical   Apply topically 2 (two) times daily.         [name] olmesartan-hydrochlorothiazide (BENICAR HCT) 20-12.5 MG per tablet   Oral   Take 1 tablet by mouth at bedtime.          [name] omeprazole (PRILOSEC) 20 MG capsule   Oral   Take 20 mg by mouth every evening.          . ondansetron (ZOFRAN) 8 MG tablet   Oral   Take 1 tablet (8 mg total) by mouth  every 8 (eight) hours as needed for nausea.   20 tablet   0   . polyethylene glycol (MIRALAX / GLYCOLAX) packet   Oral   Take 17 g by mouth daily.           . Probiotic Product (ALIGN) 4 MG CAPS   Oral   Take 1 tablet by mouth daily.          . Rhodiola rosea (RHODIOLA PO)   Oral   Take 500 mg by mouth daily.         . rosuvastatin (CRESTOR) 10 MG tablet   Oral   Take 10 mg by mouth at bedtime.          . traMADol (ULTRAM) 50 MG tablet   Oral   Take 50 mg by mouth every 6 (six) hours as needed for pain.         . vitamin B-12 (CYANOCOBALAMIN) 100 MCG tablet   Oral   Take 500 mcg by mouth daily.           BP 128/66  Pulse 89  Temp(Src) 98.9 F (37.2 C) (Oral)  Resp 14  SpO2 96% Physical Exam  Nursing note and vitals reviewed. Constitutional: He is oriented to person, place, and time. He appears well-developed and well-nourished.  HENT:  Head: Normocephalic and atraumatic.  Eyes: Pupils are equal, round, and reactive to light.  Neck: Neck supple. Muscular tenderness present. Normal range of motion present.  Cardiovascular: Normal rate and regular rhythm.   Pulmonary/Chest: Effort normal and breath sounds normal.  Abdominal: Soft. Bowel sounds are normal.  Musculoskeletal: Normal range of motion. He exhibits no edema and no tenderness.  Lymphadenopathy:    He has no cervical adenopathy.  Neurological: He is alert and oriented to person, place, and time. He has normal strength. No sensory deficit.  Skin: Skin is warm and dry.  Psychiatric: He has a normal mood and affect. His behavior  is normal. Judgment and thought content normal.    ED Course  Procedures (including critical care time) Labs Review Labs Reviewed - No data to display Imaging Review No results found.  Date: 11/15/2012 1530  Rate: 83   Rhythm: normal sinus rhythm  QRS Axis: normal  Intervals: normal  ST/T Wave abnormalities: normal  Conduction Disutrbances:none  Narrative  Interpretation:  Normal Sinus Rhythm w/o ischemic changes  Old EKG Reviewed: unchanged Radiology results reviewed and shared with patient. Patient seen by and discussed with Dr. Effie Shy prior to discharge. MDM  No diagnosis found.  Musculoskeletal neck pain.   Jimmye Norman, NP 11/15/12 1705

## 2012-11-17 ENCOUNTER — Encounter: Payer: Self-pay | Admitting: Internal Medicine

## 2012-11-17 DIAGNOSIS — M545 Low back pain, unspecified: Secondary | ICD-10-CM | POA: Diagnosis not present

## 2012-11-17 DIAGNOSIS — M76899 Other specified enthesopathies of unspecified lower limb, excluding foot: Secondary | ICD-10-CM | POA: Diagnosis not present

## 2012-11-22 DIAGNOSIS — M545 Low back pain, unspecified: Secondary | ICD-10-CM | POA: Diagnosis not present

## 2012-11-23 DIAGNOSIS — J309 Allergic rhinitis, unspecified: Secondary | ICD-10-CM | POA: Diagnosis not present

## 2012-11-24 DIAGNOSIS — M76899 Other specified enthesopathies of unspecified lower limb, excluding foot: Secondary | ICD-10-CM | POA: Diagnosis not present

## 2012-11-24 DIAGNOSIS — M545 Low back pain, unspecified: Secondary | ICD-10-CM | POA: Diagnosis not present

## 2012-11-30 ENCOUNTER — Encounter: Payer: Self-pay | Admitting: Family Medicine

## 2012-11-30 ENCOUNTER — Ambulatory Visit (INDEPENDENT_AMBULATORY_CARE_PROVIDER_SITE_OTHER): Payer: Medicare Other | Admitting: Family Medicine

## 2012-11-30 VITALS — BP 130/80 | HR 69 | Temp 98.5°F | Wt 195.0 lb

## 2012-11-30 DIAGNOSIS — E039 Hypothyroidism, unspecified: Secondary | ICD-10-CM | POA: Diagnosis not present

## 2012-11-30 DIAGNOSIS — Z23 Encounter for immunization: Secondary | ICD-10-CM

## 2012-11-30 DIAGNOSIS — Z8546 Personal history of malignant neoplasm of prostate: Secondary | ICD-10-CM | POA: Diagnosis not present

## 2012-11-30 DIAGNOSIS — R5381 Other malaise: Secondary | ICD-10-CM

## 2012-11-30 DIAGNOSIS — G4710 Hypersomnia, unspecified: Secondary | ICD-10-CM

## 2012-11-30 LAB — TSH: TSH: 0.82 u[IU]/mL (ref 0.35–5.50)

## 2012-11-30 LAB — T3, FREE: T3, Free: 2.7 pg/mL (ref 2.3–4.2)

## 2012-11-30 LAB — T4, FREE: Free T4: 0.79 ng/dL (ref 0.60–1.60)

## 2012-11-30 NOTE — Progress Notes (Signed)
  Subjective:    Patient ID: Joshua [name], male    DOB: 1941/03/09, 72 y.o.   MRN: [id number]  HPI Here to follow up on chronic fatigue and excessive daytime sleepiness. This has been going on since June. He says he sleeps well most of the time, but his wife notes he is a very restless sleeper. He does not snore much. He finds it hard to stay awake throughout most days. Several weeks ago Dr. Lovell Sheehan started him on Lexapro for depression, and he has had a nice response to this. He does not have the negative thoughts he had before and his moods are much happier. However his energy level has not improved. He had recent labs including a TSH which were normal, however the TSH has dropped for some reason. He had been in the range of 1.0 to 2.0 for years but this dropped to 0.707 six months ago and to 0.37 two weeks ago. Other than fatigue he certainly has no signs of an overactive thyroid, so it is not clear what these results may mean. He says he had a sleep study 9 years ago which was normal, but he has never actually talked to a sleep expert.    Review of Systems  Constitutional: Positive for fatigue. Negative for activity change, appetite change and unexpected weight change.  Respiratory: Negative.   Cardiovascular: Negative.   Neurological: Negative.        Objective:   Physical Exam  Constitutional: He is oriented to person, place, and time. He appears well-developed and well-nourished. No distress.  Neck: No thyromegaly present.  Cardiovascular: Normal rate, regular rhythm, normal heart sounds and intact distal pulses.   Pulmonary/Chest: Effort normal and breath sounds normal.  Lymphadenopathy:    He has no cervical adenopathy.  Neurological: He is alert and oriented to person, place, and time.          Assessment & Plan:  Chronic fatigue and sleepiness that may well be from a sleep disorder, such as PLMD. We will arrange for him to meet with Dr. Marcelyn Bruins to discuss this.  Also we will get free T3 and T4 levels today to investigate his thyroid status more closely.

## 2012-12-05 DIAGNOSIS — M25559 Pain in unspecified hip: Secondary | ICD-10-CM | POA: Diagnosis not present

## 2012-12-05 DIAGNOSIS — M545 Low back pain, unspecified: Secondary | ICD-10-CM | POA: Diagnosis not present

## 2012-12-07 DIAGNOSIS — J309 Allergic rhinitis, unspecified: Secondary | ICD-10-CM | POA: Diagnosis not present

## 2012-12-07 DIAGNOSIS — Z8546 Personal history of malignant neoplasm of prostate: Secondary | ICD-10-CM | POA: Diagnosis not present

## 2012-12-20 DIAGNOSIS — J309 Allergic rhinitis, unspecified: Secondary | ICD-10-CM | POA: Diagnosis not present

## 2013-01-02 DIAGNOSIS — J45909 Unspecified asthma, uncomplicated: Secondary | ICD-10-CM | POA: Diagnosis not present

## 2013-01-02 DIAGNOSIS — J3089 Other allergic rhinitis: Secondary | ICD-10-CM | POA: Diagnosis not present

## 2013-01-02 DIAGNOSIS — J309 Allergic rhinitis, unspecified: Secondary | ICD-10-CM | POA: Diagnosis not present

## 2013-01-02 DIAGNOSIS — J3081 Allergic rhinitis due to animal (cat) (dog) hair and dander: Secondary | ICD-10-CM | POA: Diagnosis not present

## 2013-01-02 DIAGNOSIS — J301 Allergic rhinitis due to pollen: Secondary | ICD-10-CM | POA: Diagnosis not present

## 2013-01-03 ENCOUNTER — Ambulatory Visit (INDEPENDENT_AMBULATORY_CARE_PROVIDER_SITE_OTHER): Payer: Medicare Other | Admitting: Pulmonary Disease

## 2013-01-03 ENCOUNTER — Encounter: Payer: Self-pay | Admitting: Pulmonary Disease

## 2013-01-03 VITALS — BP 124/84 | HR 78 | Temp 97.7°F | Ht 65.5 in | Wt 195.8 lb

## 2013-01-03 DIAGNOSIS — G4733 Obstructive sleep apnea (adult) (pediatric): Secondary | ICD-10-CM

## 2013-01-03 DIAGNOSIS — G2581 Restless legs syndrome: Secondary | ICD-10-CM | POA: Insufficient documentation

## 2013-01-03 MED ORDER — ROPINIROLE HCL 0.5 MG PO TABS: ORAL_TABLET | ORAL | Status: DC

## 2013-01-03 NOTE — Assessment & Plan Note (Signed)
The patient has a history of very mild OSA in the past, but his history currently he is not overly suggestive of clinically significant sleep disordered breathing.  Certainly, his sleep apnea can get worse with aging, but I would like to treat him first for a possible movement disorder of sleep.  If this does not help, then we can consider a trial of CPAP.

## 2013-01-03 NOTE — Patient Instructions (Signed)
Work on [employer] loss Will try on requip 0.5mg , take one after dinner for the first week.  If no better, can increase to 2 after dinner.   Please have your wife see how your leg kicks do, and monitor if you are sleeping better and less fatigued. Please call me in 4 weeks to discuss things.

## 2013-01-03 NOTE — Assessment & Plan Note (Signed)
The patient's history is suggestive of restless leg syndrome.  His symptoms for this seen much greater than his symptoms for possible sleep disordered breathing, and therefore I would like to give him a trial of treatment before considering CPAP.  The patient is agreeable to this approach.

## 2013-01-03 NOTE — Progress Notes (Signed)
Subjective:    Patient ID: Joshua [name], male    DOB: November 16, 1940, 72 y.o.   MRN: [id number]  HPI The patient is a 72 year old male who I've been asked to see for daytime fatigue and sleeping issues.  He had a sleep study in 2009 which showed minimal OSA, with an AHI of 8 events per hour.  It was recommended that he try weight reduction.  The patient has been doing well overall until recently, when he has noticed increasing fatigue and low energy level during the day.  His wife states that he does snore, but does not believe it is overly loud.  She has not commented on an abnormal breathing pattern during sleep.  She does say that he kicks a lot during the night, and the patient has been tried on medication for RLS in the past.  He cannot remember if it helped or not.  He does describe a discomfort in his lower extremities in the evening prior to going to bed, and movement clearly makes it better.  Patient does have frequent awakenings at night that he blames on the need to urinate, and is unrested 50% in the mornings.  He denies daytime sleepiness, but will often lying down for a nap because of fatigue.  He denies any sleepiness with driving.  The patient states that his weight is actually decreased from 2-3 years ago.   Sleep Questionnaire What time do you typically go to bed?( Between what hours) 830-10 830-10 at 0941 on 01/03/13 by Maisie Fus, CMA How long does it take you to fall asleep? 10-35mins 10-4mins at 0941 on 01/03/13 by Maisie Fus, CMA How many times during the night do you wake up? 4 4 at 0941 on 01/03/13 by Maisie Fus, CMA What time do you get out of bed to start your day? No Value 530-7 depending on day at 0941 on 01/03/13 by Maisie Fus, CMA Do you drive or operate heavy machinery in your occupation? No No at 0941 on 01/03/13 by Maisie Fus, CMA How much has your weight changed (up or down) over the past two years? (In pounds) 13 lb (5.897 kg)13 lb  (5.897 kg) increase at 0941 on 01/03/13 by Maisie Fus, CMA Have you ever had a sleep study before? Yes Yes at 0941 on 01/03/13 by Maisie Fus, CMA If yes, [facility] at 0941 on 01/03/13 by Maisie Fus, CMA If yes, date of study? 02/01/2008 02/01/2008 at 0941 on 01/03/13 by Maisie Fus, CMA Do you currently use CPAP? No No at 0941 on 01/03/13 by Maisie Fus, CMA If so, what pressure? Do you wear oxygen at any time? No No at 0941 on 01/03/13 by Maisie Fus, CMA   Review of Systems  Constitutional: Negative for fever and unexpected weight change.  HENT: Negative for congestion, dental problem, ear pain, nosebleeds, postnasal drip, rhinorrhea, sinus pressure, sneezing, sore throat and trouble swallowing.   Eyes: Negative for redness and itching.  Respiratory: Negative for cough, chest tightness, shortness of breath and wheezing.   Cardiovascular: Negative for palpitations and leg swelling.  Gastrointestinal: Negative for nausea and vomiting.  Genitourinary: Negative for dysuria.  Musculoskeletal: Negative for joint swelling.  Skin: Negative for rash.  Neurological: Negative for headaches.  Hematological: Does not bruise/bleed easily.  Psychiatric/Behavioral: Positive for dysphoric mood. The patient is not nervous/anxious.        Objective:   Physical Exam Constitutional:  Overweight male, no acute distress  HENT:  Nares patent without discharge  Oropharynx without exudate, palate and uvula are moderately elongated.   Eyes:  Perrla, eomi, no scleral icterus  Neck:  No JVD, no TMG  Cardiovascular:  Normal rate, regular rhythm, no rubs or gallops.  No murmurs        Intact distal pulses  Pulmonary :  Normal breath sounds, no stridor or respiratory distress   No rales, rhonchi, or wheezing  Abdominal:  Soft, nondistended, bowel sounds present.  No tenderness noted.   Musculoskeletal:  No lower extremity edema noted.  Lymph Nodes:   No cervical lymphadenopathy noted  Skin:  No cyanosis noted  Neurologic:  Alert, appropriate, moves all 4 extremities without obvious deficit.         Assessment & Plan:

## 2013-01-04 DIAGNOSIS — L821 Other seborrheic keratosis: Secondary | ICD-10-CM | POA: Diagnosis not present

## 2013-01-04 DIAGNOSIS — D239 Other benign neoplasm of skin, unspecified: Secondary | ICD-10-CM | POA: Diagnosis not present

## 2013-01-04 DIAGNOSIS — L819 Disorder of pigmentation, unspecified: Secondary | ICD-10-CM | POA: Diagnosis not present

## 2013-01-04 DIAGNOSIS — D1801 Hemangioma of skin and subcutaneous tissue: Secondary | ICD-10-CM | POA: Diagnosis not present

## 2013-01-04 DIAGNOSIS — B356 Tinea cruris: Secondary | ICD-10-CM | POA: Diagnosis not present

## 2013-01-08 ENCOUNTER — Other Ambulatory Visit: Payer: Self-pay | Admitting: Internal Medicine

## 2013-01-17 DIAGNOSIS — J309 Allergic rhinitis, unspecified: Secondary | ICD-10-CM | POA: Diagnosis not present

## 2013-01-30 DIAGNOSIS — J309 Allergic rhinitis, unspecified: Secondary | ICD-10-CM | POA: Diagnosis not present

## 2013-02-02 ENCOUNTER — Telehealth: Payer: Self-pay | Admitting: Pulmonary Disease

## 2013-02-02 NOTE — Telephone Encounter (Signed)
Per last OV: Patient Instructions    Work on weight loss Will try on requip 0.5mg , take one after dinner for the first week.  If no better, can increase to 2 after dinner.    Please have your wife see how your leg kicks do, and monitor if you are sleeping better and less fatigued. Please call me in 4 weeks to discuss things.    ---   I spoke with pt. He was told by spouse his legs does not jerk any longer and he has a lot more energy. He is also sleeping better. He is needing RX called into [facility]. Please advise KC thanks

## 2013-02-03 MED ORDER — ROPINIROLE HCL 0.5 MG PO TABS: ORAL_TABLET | ORAL | Status: DC

## 2013-02-03 NOTE — Telephone Encounter (Signed)
Ok to fill for one or two each night.  Ok to fill for 90 days with 3 fills.

## 2013-02-03 NOTE — Telephone Encounter (Signed)
Called, spoke with pt.  Informed him of below per [employer]  Pt verbalized understanding. However, pt doesn't want a 90 day rx called in.  He is requesting rx to be sent in for 30 days at a time.  I have given pt #[phone] x 11.  He verbalized understanding and was very thankful for KC's help and recs.

## 2013-02-21 DIAGNOSIS — J309 Allergic rhinitis, unspecified: Secondary | ICD-10-CM | POA: Diagnosis not present

## 2013-03-06 DIAGNOSIS — J309 Allergic rhinitis, unspecified: Secondary | ICD-10-CM | POA: Diagnosis not present

## 2013-03-08 ENCOUNTER — Encounter: Payer: Self-pay | Admitting: Internal Medicine

## 2013-03-08 ENCOUNTER — Ambulatory Visit (INDEPENDENT_AMBULATORY_CARE_PROVIDER_SITE_OTHER): Payer: Medicare Other | Admitting: Internal Medicine

## 2013-03-08 VITALS — BP 140/90 | HR 68 | Temp 98.1°F | Resp 16 | Ht 66.0 in | Wt 198.0 lb

## 2013-03-08 DIAGNOSIS — I10 Essential (primary) hypertension: Secondary | ICD-10-CM

## 2013-03-08 DIAGNOSIS — J309 Allergic rhinitis, unspecified: Secondary | ICD-10-CM | POA: Diagnosis not present

## 2013-03-08 DIAGNOSIS — D509 Iron deficiency anemia, unspecified: Secondary | ICD-10-CM | POA: Diagnosis not present

## 2013-03-08 DIAGNOSIS — J45909 Unspecified asthma, uncomplicated: Secondary | ICD-10-CM

## 2013-03-08 DIAGNOSIS — E785 Hyperlipidemia, unspecified: Secondary | ICD-10-CM | POA: Diagnosis not present

## 2013-03-08 MED ORDER — GABAPENTIN 300 MG PO CAPS: 300.0000 mg | ORAL_CAPSULE | Freq: Every day | ORAL | Status: DC

## 2013-03-08 NOTE — Progress Notes (Signed)
Subjective:    Patient ID: Joshua [name], male    DOB: 1940/04/15, 72 y.o.   MRN: [id number]  HPI Follow up for HTN, Lipids, and asthma  Asthma stable compliant and not having to use rescue inhaler  Border line elevations of blood pressure  With weight gain Exercising Attributes the weight gain due to the lexapro Mild dysuria and dribbling after urination Hip pain radiating to the leg    Review of Systems  Constitutional: Negative for fever and fatigue.  HENT: Negative for congestion, hearing loss and postnasal drip.   Eyes: Negative for discharge, redness and visual disturbance.  Respiratory: Negative for cough, shortness of breath and wheezing.   Cardiovascular: Negative for leg swelling.  Gastrointestinal: Negative for abdominal pain, constipation and abdominal distention.  Genitourinary: Negative for urgency and frequency.  Musculoskeletal: Negative for arthralgias, joint swelling and neck pain.  Skin: Negative for color change and rash.  Neurological: Negative for weakness and light-headedness.  Hematological: Negative for adenopathy.  Psychiatric/Behavioral: Negative for behavioral problems.   Past Medical History  Diagnosis Date  . Asthma   . CAD (coronary artery disease)     remote CABG 2001  . Depression   . GERD (gastroesophageal reflux disease)   . Hyperlipidemia   . IBS (irritable bowel syndrome)   . Acute medial meniscal tear   . DJD (degenerative joint disease)   . Hypertension   . Anemia   . Diverticulosis   . Obesity   . Pancreatitis   . PUD (peptic ulcer disease)   . Pneumonia   . Sleep apnea   . Prostate cancer   . Acute sinusitis 12/14/2006    Qualifier: Diagnosis of  By: Lovell Sheehan MD, Balinda Quails     History   Social History  . Marital Status: Married    Spouse Name: N/A    Number of Children: N/A  . Years of Education: N/A   Occupational History  . retired    Social History Main Topics  . Smoking status: Former Smoker -- 1.00  packs/day for 35 years    Types: Cigarettes    Quit date: 06/16/1992  . Smokeless tobacco: Never Used     Comment: smoked 1ppd for 35 years  . Alcohol Use: Yes     Comment: occ  . Drug Use: No  . Sexual Activity: Yes   Other Topics Concern  . Not on file   Social History Narrative  . No narrative on file    Past Surgical History  Procedure Laterality Date  . Thyroidectomy, partial    . Coronary artery bypass graft    . Cholecystectomy    . Cervical fusion    . Cervical laminectomy  05/26/06  . Arthroscopy rt knee  04/2008    daldoerf  . Prostatectomy  04/2010    Family History  Problem Relation Age of Onset  . Heart disease Father     Allergies  Allergen Reactions  . Azithromycin     Dizziness and double vision   . Bromfed     REACTION: n\T\v  . Clarithromycin     REACTION: gastritis  . Codeine   . Doxycycline Hyclate     REACTION: n\T\v  . Erythromycin Base     REACTION: rash  . Hydrocodone-Acetaminophen     REACTION: rash  . Modafinil     REACTION: anxiety-nervousness  . Promethazine Hcl     REACTION: fainting  . Sulfonamide Derivatives     REACTION: rash  .  Telithromycin     REACTION: nausea    Current Outpatient Prescriptions on File Prior to Visit  Medication Sig Dispense Refill  . aspirin 81 MG tablet Take 81 mg by mouth at bedtime.       . beclomethasone (QVAR) 80 MCG/ACT inhaler Inhale 1 puff into the lungs 2 (two) times daily.      . Coenzyme Q10 (CO Q 10 PO) Take 1 tablet by mouth daily.      [name] desloratadine (CLARINEX) 5 MG tablet Take 5 mg by mouth at bedtime.       . diazepam (VALIUM) 5 MG tablet Take 1 tablet (5 mg total) by mouth at bedtime as needed for anxiety or sleep.  30 tablet  3  . escitalopram (LEXAPRO) 10 MG tablet Take 1 tablet (10 mg total) by mouth daily.  30 tablet  4  . fluticasone (FLONASE) 50 MCG/ACT nasal spray 2 sprays  Each nostril qd  16 g  11  . folic acid (FOLVITE) 1 MG tablet Take 1 mg by mouth daily.        [name]  LAC-HYDRIN FIVE 5 % LOTN lotion APPLY TO AFFECTED AREA(S) TWICE DAILY  120 mL  2  . nystatin cream (MYCOSTATIN) Apply topically 2 (two) times daily.      [name] omeprazole (PRILOSEC) 20 MG capsule Take 1 capsule by mouth twice a day with samples as directed  60 capsule  4  . ondansetron (ZOFRAN) 8 MG tablet Take 1 tablet (8 mg total) by mouth every 8 (eight) hours as needed for nausea.  20 tablet  0  . polyethylene glycol (MIRALAX / GLYCOLAX) packet Take 17 g by mouth daily.        . Probiotic Product (ALIGN) 4 MG CAPS Take 1 tablet by mouth daily.       . Rhodiola rosea (RHODIOLA PO) Take 500 mg by mouth daily.      [name] rOPINIRole (REQUIP) 0.5 MG tablet Take 1-2 tabs each night.  60 tablet  11  . rosuvastatin (CRESTOR) 10 MG tablet Take 10 mg by mouth at bedtime.       . vitamin B-12 (CYANOCOBALAMIN) 100 MCG tablet Take 500 mcg by mouth daily.        No current facility-administered medications on file prior to visit.    BP 140/90  Pulse 68  Temp(Src) 98.1 F (36.7 C)  Resp 16  Ht 5\' 6"  (1.676 m)  Wt 198 lb (89.812 kg)  BMI 31.97 kg/m2        Objective:   Physical Exam  Nursing note and vitals reviewed. Constitutional: He appears well-developed and well-nourished.  HENT:  Head: Normocephalic and atraumatic.  Eyes: Conjunctivae are normal. Pupils are equal, round, and reactive to light.  Neck: Normal range of motion. Neck supple.  Cardiovascular: Normal rate and regular rhythm.   Murmur heard. Pulmonary/Chest: Effort normal and breath sounds normal.  Abdominal: Soft. Bowel sounds are normal.          Assessment & Plan:  Stable HTN recheck lower consistent with reported values from  Increased orthopedic issues related to lack of exercise and weight gain Prostate symptoms  From chronic prostatitis  Discussion of lipid management  And the fact that his weight gain will influence this Portion control Aerobic exercise 10,000 steps  Pain in hip and leg due to lumbar  radicular source Again weight LOSS.   Possible add to gabapentin?   Would not add tramadol

## 2013-03-08 NOTE — Patient Instructions (Signed)
At the gabapentin at bedtime if able to tolerate it without any problems try to increase it to 2 tablets or 600 mg at bedtime

## 2013-03-08 NOTE — Progress Notes (Signed)
Pre visit review using our clinic review tool, if applicable. No additional management support is needed unless otherwise documented below in the visit note. 

## 2013-03-20 DIAGNOSIS — J309 Allergic rhinitis, unspecified: Secondary | ICD-10-CM | POA: Diagnosis not present

## 2013-03-23 DIAGNOSIS — D649 Anemia, unspecified: Secondary | ICD-10-CM

## 2013-03-23 HISTORY — DX: Anemia, unspecified: D64.9

## 2013-03-24 ENCOUNTER — Telehealth: Payer: Self-pay | Admitting: Internal Medicine

## 2013-03-24 NOTE — Telephone Encounter (Signed)
Patient Information:  Caller Name: Raheen  Phone: [phone]  Patient: Joshua [name], Joshua [name]  Gender: Male  DOB: Aug 19, 1940  Age: 73 Years  PCP: Benay Pillow (Adults only)  Office Follow Up:  Does the office need to follow up with this patient?: No  Instructions For The Office: N/A  RN Note:  Transferred to scheduler for appt within 2 wks.  Symptoms  Reason For Call & Symptoms: High blood pressure: states over last few days he has checked his BP multiple times and it has been elevated. Today it was 158/95 and 148/90 on 2 separate checks. He denies sxs and states he feels great. Has gained weight recently and is trying to lose it; been at the gym each day this week. He wonders if he needs an increase on his BP med.  Reviewed Health History In EMR: Yes  Reviewed Medications In EMR: Yes  Reviewed Allergies In EMR: Yes  Reviewed Surgeries / Procedures: Yes  Date of Onset of Symptoms: 03/21/2013  Guideline(s) Used:  High Blood Pressure  Disposition Per Guideline:   See Within 2 Weeks in Office  Reason For Disposition Reached:   BP > 140/90 and is taking BP medications  Advice Given:  General:  Untreated high blood pressure may cause damage to the heart, brain, kidneys, and eyes.  The goal of blood pressure treatment for most patients with hypertension is to keep the blood pressure under 140/90.  Lifestyle Changes  Maintain a healthy weight. Lose weight if you are overweight.  Do 30 minutes of aerobic physical activity (e.g., brisk walking) most days of the week.  Eat a diet high in fresh fruits and low-fat dairy products. Limit your intake of saturated and total fat. Choose foods that are lower in salt.  If you smoke, you should stop.  If you drink alcohol, you should limit your daily alcohol drinking. Women should have no more than one drink per day. Men should have no more than 2 drinks per day. A drink is defined as 1.5 oz hard liquor (one shot or jigger; 45 ml), 5 oz wine  (small glass; 150 ml), or 12 oz beer (one can; 360 ml).  Call Back If:  Headache, blurred vision, difficulty talking, or difficulty walking occurs  Chest pain or difficulty breathing occurs  You want to go in to the office for a blood pressure check  You become worse.  Patient Will Follow Care Advice:  YES

## 2013-03-27 ENCOUNTER — Other Ambulatory Visit: Payer: Self-pay | Admitting: Internal Medicine

## 2013-03-29 ENCOUNTER — Encounter: Payer: Self-pay | Admitting: Internal Medicine

## 2013-03-29 ENCOUNTER — Ambulatory Visit (INDEPENDENT_AMBULATORY_CARE_PROVIDER_SITE_OTHER): Payer: Medicare Other | Admitting: Internal Medicine

## 2013-03-29 VITALS — BP 140/90 | HR 69 | Temp 97.8°F | Resp 20 | Ht 65.5 in | Wt 205.0 lb

## 2013-03-29 DIAGNOSIS — E785 Hyperlipidemia, unspecified: Secondary | ICD-10-CM

## 2013-03-29 DIAGNOSIS — I10 Essential (primary) hypertension: Secondary | ICD-10-CM

## 2013-03-29 MED ORDER — OLMESARTAN MEDOXOMIL-HCTZ 40-25 MG PO TABS: 1.0000 | ORAL_TABLET | Freq: Every day | ORAL | Status: DC

## 2013-03-29 NOTE — Progress Notes (Signed)
Pre-visit discussion using our clinic review tool. No additional management support is needed unless otherwise documented below in the visit note.  

## 2013-03-29 NOTE — Patient Instructions (Signed)
Limit your sodium (Salt) intake  Please check your blood pressure on a regular basis.  If it is consistently greater than 150/90, please make an office appointment.    It is important that you exercise regularly, at least 20 minutes 3 to 4 times per week.  If you develop chest pain or shortness of breath seek  medical attention.   You need to lose weight.  Consider a lower calorie diet and regular exercise. 

## 2013-03-29 NOTE — Progress Notes (Signed)
Subjective:    Patient ID: Joshua [name], male    DOB: [birth year], 73 y.o.   MRN: [id number]  HPI 73 year old patient who has treated hypertension and dyslipidemia. For the past few weeks he has had persistently elevated blood pressure readings. He generally feels quite well. No cardiopulmonary complaints.  Past Medical History  Diagnosis Date  . Asthma   . CAD (coronary artery disease)     remote CABG 2001  . Depression   . GERD (gastroesophageal reflux disease)   . Hyperlipidemia   . IBS (irritable bowel syndrome)   . Acute medial meniscal tear   . DJD (degenerative joint disease)   . Hypertension   . Anemia   . Diverticulosis   . Obesity   . Pancreatitis   . PUD (peptic ulcer disease)   . Pneumonia   . Sleep apnea   . Prostate cancer   . Acute sinusitis 12/14/2006    Qualifier: Diagnosis of  By: Arnoldo Morale MD, Balinda Quails     History   Social History  . Marital Status: Married    Spouse Name: N/A    Number of Children: N/A  . Years of Education: N/A   Occupational History  . retired    Social History Main Topics  . Smoking status: Former Smoker -- 1.00 packs/day for 35 years    Types: Cigarettes    Quit date: 06/16/1992  . Smokeless tobacco: Never Used     Comment: smoked 1ppd for 35 years  . Alcohol Use: Yes     Comment: occ  . Drug Use: No  . Sexual Activity: Yes   Other Topics Concern  . Not on file   Social History Narrative  . No narrative on file    Past Surgical History  Procedure Laterality Date  . Thyroidectomy, partial    . Coronary artery bypass graft    . Cholecystectomy    . Cervical fusion    . Cervical laminectomy  05/26/06  . Arthroscopy rt knee  04/2008    daldoerf  . Prostatectomy  04/2010    Family History  Problem Relation Age of Onset  . Heart disease Father     Allergies  Allergen Reactions  . Azithromycin     Dizziness and double vision   . Bromfed     REACTION: n\T\v  . Clarithromycin     REACTION: gastritis  .  Codeine   . Doxycycline Hyclate     REACTION: n\T\v  . Erythromycin Base     REACTION: rash  . Hydrocodone-Acetaminophen     REACTION: rash  . Modafinil     REACTION: anxiety-nervousness  . Promethazine Hcl     REACTION: fainting  . Sulfonamide Derivatives     REACTION: rash  . Telithromycin     REACTION: nausea    Current Outpatient Prescriptions on File Prior to Visit  Medication Sig Dispense Refill  . aspirin 81 MG tablet Take 81 mg by mouth at bedtime.       . beclomethasone (QVAR) 80 MCG/ACT inhaler Inhale 1 puff into the lungs 2 (two) times daily.      . Coenzyme Q10 (CO Q 10 PO) Take 1 tablet by mouth daily.      [name] desloratadine (CLARINEX) 5 MG tablet Take 5 mg by mouth at bedtime.       . diazepam (VALIUM) 5 MG tablet Take 1 tablet (5 mg total) by mouth at bedtime as needed for anxiety or sleep.  30 tablet  3  . escitalopram (LEXAPRO) 10 MG tablet TAKE ONE TABLET BY MOUTH ONE TIME DAILY   30 tablet  3  . fluticasone (FLONASE) 50 MCG/ACT nasal spray 2 sprays  Each nostril qd  16 g  11  . folic acid (FOLVITE) 1 MG tablet Take 1 mg by mouth daily.        [name] gabapentin (NEURONTIN) 300 MG capsule Take 1 capsule (300 mg total) by mouth at bedtime. Start by taking 300 mg at bedtime if he tolerates that and it does not make him groggy during the day go ahead and add a second tablet for a total of 600 mg at bedtime  90 capsule  3  . LAC-HYDRIN FIVE 5 % LOTN lotion APPLY TO AFFECTED AREA(S) TWICE DAILY  120 mL  2  . omeprazole (PRILOSEC) 20 MG capsule Take 1 capsule by mouth twice a day with samples as directed  60 capsule  4  . ondansetron (ZOFRAN) 8 MG tablet Take 1 tablet (8 mg total) by mouth every 8 (eight) hours as needed for nausea.  20 tablet  0  . polyethylene glycol (MIRALAX / GLYCOLAX) packet Take 17 g by mouth daily.        . Probiotic Product (ALIGN) 4 MG CAPS Take 1 tablet by mouth daily.       . Rhodiola rosea (RHODIOLA PO) Take 500 mg by mouth daily.      [name] rOPINIRole  (REQUIP) 0.5 MG tablet Take 1-2 tabs each night.  60 tablet  11  . rosuvastatin (CRESTOR) 10 MG tablet Take 10 mg by mouth at bedtime.       . vitamin B-12 (CYANOCOBALAMIN) 100 MCG tablet Take 500 mcg by mouth daily.        No current facility-administered medications on file prior to visit.    BP 140/90  Pulse 69  Temp(Src) 97.8 F (36.6 C) (Oral)  Resp 20  Ht 5' 5.5" (1.664 m)  Wt 205 lb (92.987 kg)  BMI 33.58 kg/m2  SpO2 98%         Review of Systems  Constitutional: Negative for fever, chills, appetite change and fatigue.  HENT: Negative for congestion, dental problem, ear pain, hearing loss, sore throat, tinnitus, trouble swallowing and voice change.   Eyes: Negative for pain, discharge and visual disturbance.  Respiratory: Negative for cough, chest tightness, wheezing and stridor.   Cardiovascular: Negative for chest pain, palpitations and leg swelling.  Gastrointestinal: Negative for nausea, vomiting, abdominal pain, diarrhea, constipation, blood in stool and abdominal distention.  Genitourinary: Negative for urgency, hematuria, flank pain, discharge, difficulty urinating and genital sores.  Musculoskeletal: Negative for arthralgias, back pain, gait problem, joint swelling, myalgias and neck stiffness.  Skin: Negative for rash.  Neurological: Negative for dizziness, syncope, speech difficulty, weakness, numbness and headaches.  Hematological: Negative for adenopathy. Does not bruise/bleed easily.  Psychiatric/Behavioral: Negative for behavioral problems and dysphoric mood. The patient is not nervous/anxious.        Objective:   Physical Exam  Constitutional: He is oriented to person, place, and time. He appears well-developed.  Blood pressure 767-341 systolic over 90 diastolic  HENT:  Head: Normocephalic.  Right Ear: External ear normal.  Left Ear: External ear normal.  Eyes: Conjunctivae and EOM are normal.  Neck: Normal range of motion.  Cardiovascular: Normal  rate and normal heart sounds.   Pulmonary/Chest: Breath sounds normal.  Abdominal: Bowel sounds are normal.  Musculoskeletal: Normal range of motion. He exhibits no edema and  no tenderness.  Neurological: He is alert and oriented to person, place, and time.  Psychiatric: He has a normal mood and affect. His behavior is normal.          Assessment & Plan:  Hypertension. Suboptimal control. We'll uptitrate Benicar hydrochlorothiazide. Continue home blood pressure monitoring. Continue efforts at weight loss exercise and low salt diet. Recheck 3 months Dyslipidemia

## 2013-03-30 ENCOUNTER — Telehealth: Payer: Self-pay | Admitting: Internal Medicine

## 2013-03-30 NOTE — Telephone Encounter (Signed)
Relevant patient education assigned to patient using Emmi. ° °

## 2013-04-03 DIAGNOSIS — J309 Allergic rhinitis, unspecified: Secondary | ICD-10-CM | POA: Diagnosis not present

## 2013-04-11 ENCOUNTER — Other Ambulatory Visit: Payer: Self-pay | Admitting: Internal Medicine

## 2013-04-14 ENCOUNTER — Encounter: Payer: Self-pay | Admitting: Cardiology

## 2013-04-14 ENCOUNTER — Ambulatory Visit (INDEPENDENT_AMBULATORY_CARE_PROVIDER_SITE_OTHER): Payer: Medicare Other | Admitting: Cardiology

## 2013-04-14 VITALS — BP 128/70 | HR 71 | Ht 65.5 in | Wt 202.4 lb

## 2013-04-14 DIAGNOSIS — I2510 Atherosclerotic heart disease of native coronary artery without angina pectoris: Secondary | ICD-10-CM | POA: Diagnosis not present

## 2013-04-14 NOTE — Progress Notes (Signed)
HPI  The patient returns for one year follow up.  Since I last saw him he has done well.  He is exercising routinely. He goes to the gym for 6-8 hours per week. With this level of activity he denies any chest discomfort, neck or arm discomfort. He does not report palpitations, presyncope or syncope. He does not have shortness of breath, PND or orthopnea. He's had weight gain secondary to an antidepressant that he started.    Allergies  Allergen Reactions  . Azithromycin     Dizziness and double vision   . Bromfed     REACTION: n\T\v  . Clarithromycin     REACTION: gastritis  . Codeine   . Doxycycline Hyclate     REACTION: n\T\v  . Erythromycin Base     REACTION: rash  . Hydrocodone-Acetaminophen     REACTION: rash  . Modafinil     REACTION: anxiety-nervousness  . Promethazine Hcl     REACTION: fainting  . Sulfonamide Derivatives     REACTION: rash  . Telithromycin     REACTION: nausea    Current Outpatient Prescriptions  Medication Sig Dispense Refill  . aspirin 81 MG tablet Take 81 mg by mouth at bedtime.       . beclomethasone (QVAR) 80 MCG/ACT inhaler Inhale 1 puff into the lungs 2 (two) times daily.      . Coenzyme Q10 (CO Q 10 PO) Take 1 tablet by mouth daily.      [name] desloratadine (CLARINEX) 5 MG tablet Take 5 mg by mouth at bedtime.       . diazepam (VALIUM) 5 MG tablet Take one tablet by mouth nightly at bedtime  as needed  30 tablet  3  . escitalopram (LEXAPRO) 10 MG tablet TAKE ONE TABLET BY MOUTH ONE TIME DAILY   30 tablet  3  . fluticasone (FLONASE) 50 MCG/ACT nasal spray 2 sprays  Each nostril qd  16 g  11  . folic acid (FOLVITE) 1 MG tablet Take 1 mg by mouth daily.        [name] gabapentin (NEURONTIN) 300 MG capsule Take 1 capsule (300 mg total) by mouth at bedtime. Start by taking 300 mg at bedtime if he tolerates that and it does not make him groggy during the day go ahead and add a second tablet for a total of 600 mg at bedtime  90 capsule  3  . LAC-HYDRIN  FIVE 5 % LOTN lotion APPLY TO AFFECTED AREA(S) TWICE DAILY  120 mL  2  . olmesartan-hydrochlorothiazide (BENICAR HCT) 40-25 MG per tablet Take 1 tablet by mouth daily.  90 tablet  4  . omeprazole (PRILOSEC) 20 MG capsule Take 1 capsule by mouth twice a day with samples as directed  60 capsule  4  . ondansetron (ZOFRAN) 8 MG tablet Take 1 tablet (8 mg total) by mouth every 8 (eight) hours as needed for nausea.  20 tablet  0  . polyethylene glycol (MIRALAX / GLYCOLAX) packet Take 17 g by mouth daily.        . Probiotic Product (ALIGN) 4 MG CAPS Take 1 tablet by mouth daily.       . Rhodiola rosea (RHODIOLA PO) Take 500 mg by mouth daily.      [name] rOPINIRole (REQUIP) 0.5 MG tablet Take 1-2 tabs each night.  60 tablet  11  . rosuvastatin (CRESTOR) 10 MG tablet Take 10 mg by mouth at bedtime.       [name]  vitamin B-12 (CYANOCOBALAMIN) 100 MCG tablet Take 500 mcg by mouth daily.        No current facility-administered medications for this visit.    Past Medical History  Diagnosis Date  . Asthma   . CAD (coronary artery disease)     remote CABG 2001  . Depression   . GERD (gastroesophageal reflux disease)   . Hyperlipidemia   . IBS (irritable bowel syndrome)   . Acute medial meniscal tear   . DJD (degenerative joint disease)   . Hypertension   . Anemia   . Diverticulosis   . Obesity   . Pancreatitis   . PUD (peptic ulcer disease)   . Pneumonia   . Sleep apnea   . Prostate cancer   . Acute sinusitis 12/14/2006    Qualifier: Diagnosis of  By: Arnoldo Morale MD, Balinda Quails     Past Surgical History  Procedure Laterality Date  . Thyroidectomy, partial    . Coronary artery bypass graft    . Cholecystectomy    . Cervical fusion    . Cervical laminectomy  05/26/06  . Arthroscopy rt knee  04/2008    daldoerf  . Prostatectomy  04/2010    ROS: Pain in his toes (probable neuropathy) As stated in the HPI and negative for all other systems.   PHYSICAL EXAM BP 128/70  Pulse 71  Ht 5' 5.5" (1.664 m)  Wt  202 lb 6.4 oz (91.808 kg)  BMI 33.16 kg/m2 GENERAL:  Well appearing NECK:  No jugular venous distention, waveform within normal limits, carotid upstroke brisk and symmetric, no bruits, no thyromegaly LUNGS:  Clear to auscultation bilaterally CHEST:  Well healed sternotomy scar. HEART:  PMI not displaced or sustained,S1 and S2 within normal limits, no S3, no S4, no clicks, no rubs, no murmurs ABD:  Flat, positive bowel sounds normal in frequency in pitch, no bruits, no rebound, no guarding, no midline pulsatile mass, no hepatomegaly, no splenomegaly EXT:  2 plus pulses throughout, no edema, no cyanosis no clubbing   EKG:  Sinus rhythm, rate 71, axis within normal limits, poor anterior R wave progression, no acute ST-T wave changes, rightward axis.  04/14/2013  ASSESSMENT AND PLAN  CORONARY ARTERY DISEASE -  He has had no new symptoms since his treadmill test last year. No change in therapy is indicated. He will continue with risk reduction.   HYPERLIPIDEMIA -   He had an excellent lipid profile last year and I will defer to his provider.  HTN - His BP is somewhat elevated. He had his ARB increased 3 weeks ago.  The blood pressure continues to be high. I have instructed the patient to record a blood pressure diary and recording this. This will be presented for my review and pending these results I will make further suggestions about changes in therapy for optimal blood pressure control.

## 2013-04-14 NOTE — Patient Instructions (Signed)
The current medical regimen is effective;  continue present plan and medications.  Follow up in 1 year with Dr Hochrein.  You will receive a letter in the mail 2 months before you are due.  Please call us when you receive this letter to schedule your follow up appointment.  

## 2013-04-17 DIAGNOSIS — J309 Allergic rhinitis, unspecified: Secondary | ICD-10-CM | POA: Diagnosis not present

## 2013-05-02 DIAGNOSIS — J309 Allergic rhinitis, unspecified: Secondary | ICD-10-CM | POA: Diagnosis not present

## 2013-05-03 DIAGNOSIS — H52209 Unspecified astigmatism, unspecified eye: Secondary | ICD-10-CM | POA: Diagnosis not present

## 2013-05-03 DIAGNOSIS — H2510 Age-related nuclear cataract, unspecified eye: Secondary | ICD-10-CM | POA: Diagnosis not present

## 2013-05-10 ENCOUNTER — Telehealth: Payer: Self-pay | Admitting: Pulmonary Disease

## 2013-05-10 NOTE — Telephone Encounter (Signed)
Called the pt to verify the msg  Pt states that requip did not help with restless legs  He stopped taking med and his PCP but him on gabapentin and this has helped a great deal  His PCP will continue to refill gabapentin  FYI for [facility]

## 2013-05-11 ENCOUNTER — Other Ambulatory Visit: Payer: Self-pay | Admitting: Internal Medicine

## 2013-05-15 DIAGNOSIS — J309 Allergic rhinitis, unspecified: Secondary | ICD-10-CM | POA: Diagnosis not present

## 2013-05-29 DIAGNOSIS — J309 Allergic rhinitis, unspecified: Secondary | ICD-10-CM | POA: Diagnosis not present

## 2013-06-05 DIAGNOSIS — J309 Allergic rhinitis, unspecified: Secondary | ICD-10-CM | POA: Diagnosis not present

## 2013-06-13 DIAGNOSIS — Z8546 Personal history of malignant neoplasm of prostate: Secondary | ICD-10-CM | POA: Diagnosis not present

## 2013-06-13 DIAGNOSIS — J309 Allergic rhinitis, unspecified: Secondary | ICD-10-CM | POA: Diagnosis not present

## 2013-06-15 DIAGNOSIS — J309 Allergic rhinitis, unspecified: Secondary | ICD-10-CM | POA: Diagnosis not present

## 2013-06-19 DIAGNOSIS — N393 Stress incontinence (female) (male): Secondary | ICD-10-CM | POA: Diagnosis not present

## 2013-06-19 DIAGNOSIS — N529 Male erectile dysfunction, unspecified: Secondary | ICD-10-CM | POA: Diagnosis not present

## 2013-06-19 DIAGNOSIS — N489 Disorder of penis, unspecified: Secondary | ICD-10-CM | POA: Diagnosis not present

## 2013-06-19 DIAGNOSIS — J309 Allergic rhinitis, unspecified: Secondary | ICD-10-CM | POA: Diagnosis not present

## 2013-06-19 DIAGNOSIS — Z8546 Personal history of malignant neoplasm of prostate: Secondary | ICD-10-CM | POA: Diagnosis not present

## 2013-06-26 DIAGNOSIS — J309 Allergic rhinitis, unspecified: Secondary | ICD-10-CM | POA: Diagnosis not present

## 2013-07-10 DIAGNOSIS — J309 Allergic rhinitis, unspecified: Secondary | ICD-10-CM | POA: Diagnosis not present

## 2013-07-11 ENCOUNTER — Encounter: Payer: Self-pay | Admitting: Family

## 2013-07-11 ENCOUNTER — Ambulatory Visit (INDEPENDENT_AMBULATORY_CARE_PROVIDER_SITE_OTHER): Payer: Medicare Other | Admitting: Family

## 2013-07-11 VITALS — BP 100/64 | HR 68 | Ht 65.5 in | Wt 205.4 lb

## 2013-07-11 DIAGNOSIS — R5383 Other fatigue: Secondary | ICD-10-CM | POA: Diagnosis not present

## 2013-07-11 DIAGNOSIS — R5381 Other malaise: Secondary | ICD-10-CM

## 2013-07-11 DIAGNOSIS — R29818 Other symptoms and signs involving the nervous system: Secondary | ICD-10-CM | POA: Diagnosis not present

## 2013-07-11 DIAGNOSIS — I2510 Atherosclerotic heart disease of native coronary artery without angina pectoris: Secondary | ICD-10-CM | POA: Diagnosis not present

## 2013-07-11 DIAGNOSIS — R2689 Other abnormalities of gait and mobility: Secondary | ICD-10-CM

## 2013-07-11 LAB — CBC WITH DIFFERENTIAL/PLATELET
Basophils Absolute: 0 10*3/uL (ref 0.0–0.1)
Basophils Relative: 0.6 % (ref 0.0–3.0)
Eosinophils Absolute: 0.2 10*3/uL (ref 0.0–0.7)
Eosinophils Relative: 3.2 % (ref 0.0–5.0)
HCT: 43.5 % (ref 39.0–52.0)
Hemoglobin: 14.9 g/dL (ref 13.0–17.0)
Lymphocytes Relative: 21.6 % (ref 12.0–46.0)
Lymphs Abs: 1.5 10*3/uL (ref 0.7–4.0)
MCHC: 34.3 g/dL (ref 30.0–36.0)
MCV: 91.9 fl (ref 78.0–100.0)
Monocytes Absolute: 0.8 10*3/uL (ref 0.1–1.0)
Monocytes Relative: 11.3 % (ref 3.0–12.0)
Neutro Abs: 4.3 10*3/uL (ref 1.4–7.7)
Neutrophils Relative %: 63.3 % (ref 43.0–77.0)
Platelets: 212 10*3/uL (ref 150.0–400.0)
RBC: 4.73 Mil/uL (ref 4.22–5.81)
RDW: 12.4 % (ref 11.5–14.6)
WBC: 6.8 10*3/uL (ref 4.5–10.5)

## 2013-07-11 LAB — COMPREHENSIVE METABOLIC PANEL
ALT: 22 U/L (ref 0–53)
AST: 25 U/L (ref 0–37)
Albumin: 3.8 g/dL (ref 3.5–5.2)
Alkaline Phosphatase: 47 U/L (ref 39–117)
BUN: 17 mg/dL (ref 6–23)
CO2: 33 mEq/L — ABNORMAL HIGH (ref 19–32)
Calcium: 9.5 mg/dL (ref 8.4–10.5)
Chloride: 97 mEq/L (ref 96–112)
Creatinine, Ser: 1.3 mg/dL (ref 0.4–1.5)
GFR: 58.46 mL/min — ABNORMAL LOW (ref >60.00)
Glucose, Bld: 98 mg/dL (ref 70–99)
Potassium: 4.5 mEq/L (ref 3.5–5.1)
Sodium: 135 mEq/L (ref 135–145)
Total Bilirubin: 1.1 mg/dL (ref 0.3–1.2)
Total Protein: 6.8 g/dL (ref 6.0–8.3)

## 2013-07-11 LAB — TSH: TSH: 1.08 u[IU]/mL (ref 0.35–5.50)

## 2013-07-11 NOTE — Patient Instructions (Signed)

## 2013-07-11 NOTE — Progress Notes (Signed)
Subjective:    Patient ID: Joshua [name], male    DOB: 10/02/1940, 73 y.o.   MRN: [id number]  HPI  73 year old white male, nonsmoker is in today with complaints of fatigue and imbalance ongoing x1 year. Reports being up several times during the night not sleeping well only to take Valium. He tries not to take Valium on. Reports napping twice a day it began about 6-7 hours. He had a sleep study done that was negative. Had issues with urinary frequency and had a prostatectomy corrected that issue. 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 Reports occasionally feeling dizzy when he bends over but is corrected within seconds after standing.   Review of Systems  Constitutional: Positive for fatigue. Negative for fever.  HENT: Negative.   Respiratory: Negative.   Cardiovascular: Negative.   Endocrine: Negative.   Genitourinary: Negative.   Musculoskeletal: Negative.   Skin: Negative.   Neurological: Negative.   Hematological: Negative.   Psychiatric/Behavioral: Positive for sleep disturbance.   Past Medical History  Diagnosis Date  . Asthma   . CAD (coronary artery disease)     remote CABG 2001  . Depression   . GERD (gastroesophageal reflux disease)   . Hyperlipidemia   . IBS (irritable bowel syndrome)   . Acute medial meniscal tear   . DJD (degenerative joint disease)   . Hypertension   . Anemia   . Diverticulosis   . Obesity   . Pancreatitis   . PUD (peptic ulcer disease)   . Pneumonia   . Sleep apnea   . Prostate cancer   . Acute sinusitis 12/14/2006    Qualifier: Diagnosis of  By: Arnoldo Morale MD, Balinda Quails     History   Social History  . Marital Status: Married    Spouse Name: N/A    Number of Children: N/A  . Years of Education: N/A   Occupational History  . retired    Social History Main Topics  . Smoking status: Former Smoker -- 1.00 packs/day for 35 years    Types: Cigarettes    Quit date: 06/16/1992  . Smokeless tobacco: Never Used     Comment:  smoked 1ppd for 35 years  . Alcohol Use: Yes     Comment: occ  . Drug Use: No  . Sexual Activity: Yes   Other Topics Concern  . Not on file   Social History Narrative  . No narrative on file    Past Surgical History  Procedure Laterality Date  . Thyroidectomy, partial    . Coronary artery bypass graft    . Cholecystectomy    . Cervical fusion    . Cervical laminectomy  05/26/06  . Arthroscopy rt knee  04/2008    daldoerf  . Prostatectomy  04/2010    Family History  Problem Relation Age of Onset  . Heart disease Father     Allergies  Allergen Reactions  . Azithromycin     Dizziness and double vision   . Bromfed     REACTION: n\T\v  . Clarithromycin     REACTION: gastritis  . Codeine   . Doxycycline Hyclate     REACTION: n\T\v  . Erythromycin Base     REACTION: rash  . Hydrocodone-Acetaminophen     REACTION: rash  . Modafinil     REACTION: anxiety-nervousness  . Promethazine Hcl     REACTION: fainting  . Sulfonamide Derivatives     REACTION: rash  . Telithromycin  REACTION: nausea    Current Outpatient Prescriptions on File Prior to Visit  Medication Sig Dispense Refill  . aspirin 81 MG tablet Take 81 mg by mouth at bedtime.       . beclomethasone (QVAR) 80 MCG/ACT inhaler Inhale 1 puff into the lungs 2 (two) times daily.      . Coenzyme Q10 (CO Q 10 PO) Take 1 tablet by mouth daily.      . CRESTOR 10 MG tablet Take one tablet by mouth nightly at bedtime  30 tablet  9  . desloratadine (CLARINEX) 5 MG tablet Take 5 mg by mouth at bedtime.       . diazepam (VALIUM) 5 MG tablet Take one tablet by mouth nightly at bedtime  as needed  30 tablet  3  . escitalopram (LEXAPRO) 10 MG tablet TAKE ONE TABLET BY MOUTH ONE TIME DAILY   30 tablet  3  . fluticasone (FLONASE) 50 MCG/ACT nasal spray 2 sprays  Each nostril qd  16 g  11  . folic acid (FOLVITE) 1 MG tablet Take 1 mg by mouth daily.        [name] gabapentin (NEURONTIN) 300 MG capsule Take 1 capsule (300 mg total)  by mouth at bedtime. Start by taking 300 mg at bedtime if he tolerates that and it does not make him groggy during the day go ahead and add a second tablet for a total of 600 mg at bedtime  90 capsule  3  . LAC-HYDRIN FIVE 5 % LOTN lotion APPLY TO AFFECTED AREA(S) TWICE DAILY  120 mL  2  . olmesartan-hydrochlorothiazide (BENICAR HCT) 40-25 MG per tablet Take 1 tablet by mouth daily.  90 tablet  4  . omeprazole (PRILOSEC) 20 MG capsule Take 1 capsule by mouth twice a day with samples as directed  60 capsule  4  . ondansetron (ZOFRAN) 8 MG tablet Take 1 tablet (8 mg total) by mouth every 8 (eight) hours as needed for nausea.  20 tablet  0  . polyethylene glycol (MIRALAX / GLYCOLAX) packet Take 17 g by mouth daily.        . Probiotic Product (ALIGN) 4 MG CAPS Take 1 tablet by mouth daily.       . Rhodiola rosea (RHODIOLA PO) Take 500 mg by mouth daily.      [name] rOPINIRole (REQUIP) 0.5 MG tablet Take 1-2 tabs each night.  60 tablet  11  . vitamin B-12 (CYANOCOBALAMIN) 100 MCG tablet Take 500 mcg by mouth daily.        No current facility-administered medications on file prior to visit.    BP 100/64  Pulse 68  Ht 5' 5.5" (1.664 m)  Wt 205 lb 6.4 oz (93.169 kg)  BMI 33.65 kg/m2chart    Objective:   Physical Exam  Constitutional: He is oriented to person, place, and time. He appears well-developed and well-nourished.  HENT:  Right Ear: External ear normal.  Left Ear: External ear normal.  Nose: Nose normal.  Mouth/Throat: Oropharynx is clear and moist.  Neck: Normal range of motion. Neck supple.  Cardiovascular: Normal rate, regular rhythm and normal heart sounds.   Pulmonary/Chest: Effort normal and breath sounds normal.  Abdominal: Soft. Bowel sounds are normal.  Musculoskeletal: Normal range of motion.  Neurological: He is alert and oriented to person, place, and time.  Skin: Skin is warm and dry.  Psychiatric: He has a normal mood and affect.          Assessment &  Plan:  Cristobal  was seen today for fatigue and balance issues.  Diagnoses and associated orders for this visit:  Other malaise and fatigue - CBC with Differential - TSH - CMP  Balance disorder - CBC with Differential - TSH - CMP     Plan: We'll obtain labs today to be sure he doesn't have an electrolyte imbalance that he did several months ago. Particularly, because his hydrochlorothiazide was increased. Also consider carotid Doppler study. Consider overnight oximetry test. We'll follow up pending labs and discuss further treatment plan.

## 2013-07-11 NOTE — Progress Notes (Signed)
Pre visit review using our clinic review tool, if applicable. No additional management support is needed unless otherwise documented below in the visit note. 

## 2013-07-19 ENCOUNTER — Telehealth: Payer: Self-pay | Admitting: Internal Medicine

## 2013-07-19 DIAGNOSIS — R42 Dizziness and giddiness: Secondary | ICD-10-CM

## 2013-07-19 NOTE — Telephone Encounter (Signed)
Pt calling in requesting information about the status of a venus doppler ultrasound of his carotid artery that was discussed at last appointment.  I do not see an order in the system.  Please advise.

## 2013-07-19 NOTE — Telephone Encounter (Signed)
Please advise 

## 2013-07-19 NOTE — Telephone Encounter (Signed)
Please schdeule

## 2013-07-24 ENCOUNTER — Encounter (HOSPITAL_COMMUNITY): Payer: Medicare Other

## 2013-07-27 ENCOUNTER — Ambulatory Visit (HOSPITAL_COMMUNITY): Payer: Medicare Other | Attending: Internal Medicine | Admitting: Cardiology

## 2013-07-27 DIAGNOSIS — I658 Occlusion and stenosis of other precerebral arteries: Secondary | ICD-10-CM | POA: Insufficient documentation

## 2013-07-27 DIAGNOSIS — I2510 Atherosclerotic heart disease of native coronary artery without angina pectoris: Secondary | ICD-10-CM | POA: Diagnosis not present

## 2013-07-27 DIAGNOSIS — I6529 Occlusion and stenosis of unspecified carotid artery: Secondary | ICD-10-CM | POA: Insufficient documentation

## 2013-07-27 DIAGNOSIS — Z832 Family history of diseases of the blood and blood-forming organs and certain disorders involving the immune mechanism: Secondary | ICD-10-CM | POA: Diagnosis not present

## 2013-07-27 DIAGNOSIS — Z951 Presence of aortocoronary bypass graft: Secondary | ICD-10-CM | POA: Insufficient documentation

## 2013-07-27 DIAGNOSIS — R269 Unspecified abnormalities of gait and mobility: Secondary | ICD-10-CM | POA: Diagnosis not present

## 2013-07-27 DIAGNOSIS — R42 Dizziness and giddiness: Secondary | ICD-10-CM | POA: Diagnosis not present

## 2013-07-27 DIAGNOSIS — I10 Essential (primary) hypertension: Secondary | ICD-10-CM | POA: Diagnosis not present

## 2013-07-27 DIAGNOSIS — E785 Hyperlipidemia, unspecified: Secondary | ICD-10-CM | POA: Diagnosis not present

## 2013-07-27 DIAGNOSIS — R279 Unspecified lack of coordination: Secondary | ICD-10-CM | POA: Insufficient documentation

## 2013-07-27 NOTE — Progress Notes (Signed)
Carotid duplex performed 

## 2013-07-30 ENCOUNTER — Other Ambulatory Visit: Payer: Self-pay | Admitting: Internal Medicine

## 2013-08-01 ENCOUNTER — Other Ambulatory Visit: Payer: Self-pay | Admitting: Family

## 2013-08-01 ENCOUNTER — Ambulatory Visit (INDEPENDENT_AMBULATORY_CARE_PROVIDER_SITE_OTHER): Payer: Medicare Other | Admitting: Family

## 2013-08-01 ENCOUNTER — Encounter: Payer: Self-pay | Admitting: Family

## 2013-08-01 VITALS — BP 120/84 | HR 64 | Temp 97.8°F | Ht 65.5 in | Wt 203.0 lb

## 2013-08-01 DIAGNOSIS — J309 Allergic rhinitis, unspecified: Secondary | ICD-10-CM | POA: Diagnosis not present

## 2013-08-01 DIAGNOSIS — R42 Dizziness and giddiness: Secondary | ICD-10-CM

## 2013-08-01 DIAGNOSIS — R5381 Other malaise: Secondary | ICD-10-CM

## 2013-08-01 DIAGNOSIS — I2510 Atherosclerotic heart disease of native coronary artery without angina pectoris: Secondary | ICD-10-CM | POA: Diagnosis not present

## 2013-08-01 DIAGNOSIS — R5383 Other fatigue: Secondary | ICD-10-CM | POA: Diagnosis not present

## 2013-08-01 DIAGNOSIS — D510 Vitamin B12 deficiency anemia due to intrinsic factor deficiency: Secondary | ICD-10-CM | POA: Diagnosis not present

## 2013-08-01 DIAGNOSIS — F329 Major depressive disorder, single episode, unspecified: Secondary | ICD-10-CM | POA: Diagnosis not present

## 2013-08-01 DIAGNOSIS — F3289 Other specified depressive episodes: Secondary | ICD-10-CM

## 2013-08-01 DIAGNOSIS — R7989 Other specified abnormal findings of blood chemistry: Secondary | ICD-10-CM

## 2013-08-01 LAB — TESTOSTERONE: Testosterone: 258.39 ng/dL — ABNORMAL LOW (ref 300.00–890.00)

## 2013-08-01 NOTE — Progress Notes (Signed)
Subjective:    Patient ID: Joshua [name], male    DOB: 07-May-1940, 73 y.o.   MRN: [id number]  HPI 73 year old. He's recently began to have more falls. Has fallen twice since his last office visit. Denies any head injury. Saw a neurologist approximately 25 years ago but no recent visit. Also continues to complain about fatigue. Has a history of pernicious anemia and has been stable on B12 replacement. Denies any known history of hypogonadism. Thyroid has been normal. Is requesting to discontinue Lexapro.   Review of Systems  Constitutional: Positive for fatigue.  HENT: Negative.   Respiratory: Negative.   Cardiovascular: Negative.   Gastrointestinal: Negative.   Endocrine: Negative.   Genitourinary: Negative.   Musculoskeletal: Negative.   Skin: Negative.   Neurological: Negative.   Hematological: Negative.   Psychiatric/Behavioral: Negative.    Past Medical History  Diagnosis Date  . Asthma   . CAD (coronary artery disease)     remote CABG 2001  . Depression   . GERD (gastroesophageal reflux disease)   . Hyperlipidemia   . IBS (irritable bowel syndrome)   . Acute medial meniscal tear   . DJD (degenerative joint disease)   . Hypertension   . Anemia   . Diverticulosis   . Obesity   . Pancreatitis   . PUD (peptic ulcer disease)   . Pneumonia   . Sleep apnea   . Prostate cancer   . Acute sinusitis 12/14/2006    Qualifier: Diagnosis of  By: Arnoldo Morale MD, Balinda Quails     History   Social History  . Marital Status: Married    Spouse Name: N/A    Number of Children: N/A  . Years of Education: N/A   Occupational History  . retired    Social History Main Topics  . Smoking status: Former Smoker -- 1.00 packs/day for 35 years    Types: Cigarettes    Quit date: 06/16/1992  . Smokeless tobacco: Never Used     Comment: smoked 1ppd for 35 years  . Alcohol Use: Yes     Comment: occ  . Drug Use: No  . Sexual Activity: Yes   Other Topics Concern  . Not on file    Social History Narrative  . No narrative on file    Past Surgical History  Procedure Laterality Date  . Thyroidectomy, partial    . Coronary artery bypass graft    . Cholecystectomy    . Cervical fusion    . Cervical laminectomy  05/26/06  . Arthroscopy rt knee  04/2008    daldoerf  . Prostatectomy  04/2010    Family History  Problem Relation Age of Onset  . Heart disease Father     Allergies  Allergen Reactions  . Azithromycin     Dizziness and double vision   . Bromfed     REACTION: n\T\v  . Clarithromycin     REACTION: gastritis  . Codeine   . Doxycycline Hyclate     REACTION: n\T\v  . Erythromycin Base     REACTION: rash  . Hydrocodone-Acetaminophen     REACTION: rash  . Modafinil     REACTION: anxiety-nervousness  . Promethazine Hcl     REACTION: fainting  . Sulfonamide Derivatives     REACTION: rash  . Telithromycin     REACTION: nausea    Current Outpatient Prescriptions on File Prior to Visit  Medication Sig Dispense Refill  . aspirin 81 MG tablet Take 81 mg by mouth  at bedtime.       . beclomethasone (QVAR) 80 MCG/ACT inhaler Inhale 1 puff into the lungs 2 (two) times daily.      . Coenzyme Q10 (CO Q 10 PO) Take 1 tablet by mouth daily.      . CRESTOR 10 MG tablet Take one tablet by mouth nightly at bedtime  30 tablet  9  . desloratadine (CLARINEX) 5 MG tablet Take 5 mg by mouth at bedtime.       . diazepam (VALIUM) 5 MG tablet Take one tablet by mouth nightly at bedtime  as needed  30 tablet  3  . fluticasone (FLONASE) 50 MCG/ACT nasal spray 2 sprays  Each nostril qd  16 g  11  . folic acid (FOLVITE) 1 MG tablet Take 1 mg by mouth daily.        [name] gabapentin (NEURONTIN) 300 MG capsule Take 1 capsule (300 mg total) by mouth at bedtime. Start by taking 300 mg at bedtime if he tolerates that and it does not make him groggy during the day go ahead and add a second tablet for a total of 600 mg at bedtime  90 capsule  3  . LAC-HYDRIN FIVE 5 % LOTN lotion  APPLY TO AFFECTED AREA(S) TWICE DAILY  120 mL  2  . olmesartan-hydrochlorothiazide (BENICAR HCT) 40-25 MG per tablet Take 1 tablet by mouth daily.  90 tablet  4  . omeprazole (PRILOSEC) 20 MG capsule Take 1 capsule by mouth twice a day with samples as directed  60 capsule  4  . ondansetron (ZOFRAN) 8 MG tablet Take 1 tablet (8 mg total) by mouth every 8 (eight) hours as needed for nausea.  20 tablet  0  . polyethylene glycol (MIRALAX / GLYCOLAX) packet Take 17 g by mouth daily.        . Probiotic Product (ALIGN) 4 MG CAPS Take 1 tablet by mouth daily.       . Rhodiola rosea (RHODIOLA PO) Take 500 mg by mouth daily.      [name] rOPINIRole (REQUIP) 0.5 MG tablet Take 1-2 tabs each night.  60 tablet  11  . vitamin B-12 (CYANOCOBALAMIN) 100 MCG tablet Take 500 mcg by mouth daily.        No current facility-administered medications on file prior to visit.    BP 120/84  Pulse 64  Temp(Src) 97.8 F (36.6 C) (Oral)  Ht 5' 5.5" (1.664 m)  Wt 203 lb (92.08 kg)  BMI 33.26 kg/m2  SpO2 98%chart    Objective:   Physical Exam  Constitutional: He is oriented to person, place, and time. He appears well-developed and well-nourished.  HENT:  Right Ear: External ear normal.  Left Ear: External ear normal.  Nose: Nose normal.  Mouth/Throat: Oropharynx is clear and moist.  Neck: Normal range of motion. Neck supple.  Cardiovascular: Normal rate, regular rhythm and normal heart sounds.   Pulmonary/Chest: Effort normal and breath sounds normal.  Abdominal: Soft. Bowel sounds are normal.  Musculoskeletal: Normal range of motion.  Neurological: He is alert and oriented to person, place, and time.  Skin: Skin is warm and dry.  Psychiatric: He has a normal mood and affect.          Assessment & Plan:  Joshua [name] was seen today for follow-up.  Diagnoses and associated orders for this visit:  Other malaise and fatigue - Testosterone  Pernicious anemia  Dizziness and giddiness - Ambulatory referral to  Neurology  Depressive disorder, not elsewhere  classified   Discontinue Lexapro after taking 5mg  once a day x 1 week. Order sleep study if testosterone if normal.

## 2013-08-01 NOTE — Patient Instructions (Signed)
Dizziness °Dizziness is a common problem. It is a feeling of unsteadiness or lightheadedness. You may feel like you are about to faint. Dizziness can lead to injury if you stumble or fall. A person of any age group can suffer from dizziness, but dizziness is more common in older adults. °CAUSES  °Dizziness can be caused by many different things, including: °· Middle ear problems. °· Standing for too long. °· Infections. °· An allergic reaction. °· Aging. °· An emotional response to something, such as the sight of blood. °· Side effects of medicines. °· Fatigue. °· Problems with circulation or blood pressure. °· Excess use of alcohol, medicines, or illegal drug use. °· Breathing too fast (hyperventilation). °· An arrhythmia or problems with your heart rhythm. °· Low red blood cell count (anemia). °· Pregnancy. °· Vomiting, diarrhea, fever, or other illnesses that cause dehydration. °· Diseases or conditions such as Parkinson's disease, high blood pressure (hypertension), diabetes, and thyroid problems. °· Exposure to extreme heat. °DIAGNOSIS  °To find the cause of your dizziness, your caregiver may do a physical exam, lab tests, radiologic imaging scans, or an electrocardiography test (ECG).  °TREATMENT  °Treatment of dizziness depends on the cause of your symptoms and can vary greatly. °HOME CARE INSTRUCTIONS  °· Drink enough fluids to keep your urine clear or pale yellow. This is especially important in very hot weather. In the elderly, it is also important in cold weather. °· If your dizziness is caused by medicines, take them exactly as directed. When taking blood pressure medicines, it is especially important to get up slowly. °· Rise slowly from chairs and steady yourself until you feel okay. °· In the morning, first sit up on the side of the bed. When this seems okay, stand slowly while holding onto something until you know your balance is fine. °· If you need to stand in one place for a long time, be sure to  move your legs often. Tighten and relax the muscles in your legs while standing. °· If dizziness continues to be a problem, have someone stay with you for a day or two. Do this until you feel you are well enough to stay alone. Have the person call your caregiver if he or she notices changes in you that are concerning. °· Do not drive or use heavy machinery if you feel dizzy. °· Do not drink alcohol. °SEEK IMMEDIATE MEDICAL CARE IF:  °· Your dizziness or lightheadedness gets worse. °· You feel nauseous or vomit. °· You develop problems with talking, walking, weakness, or using your arms, hands, or legs. °· You are not thinking clearly or you have difficulty forming sentences. It may take a friend or family member to determine if your thinking is normal. °· You develop chest pain, abdominal pain, shortness of breath, or sweating. °· Your vision changes. °· You notice any bleeding. °· You have side effects from medicine that seems to be getting worse rather than better. °MAKE SURE YOU:  °· Understand these instructions. °· Will watch your condition. °· Will get help right away if you are not doing well or get worse. °Document Released: 09/02/2000 Document Revised: 06/01/2011 Document Reviewed: 09/26/2010 °[employer]® Patient Information ©2014 [employer]. ° °Fatigue °Fatigue is a feeling of tiredness, lack of energy, lack of motivation, or feeling tired all the time. Having enough rest, good nutrition, and reducing stress will normally reduce fatigue. Consult your caregiver if it persists. The nature of your fatigue will help your caregiver to find out   its cause. The treatment is based on the cause.  °CAUSES  °There are many causes for fatigue. Most of the time, fatigue can be traced to one or more of your habits or routines. Most causes fit into one or more of three general areas. They are: °Lifestyle problems °· Sleep disturbances. °· Overwork. °· Physical exertion. °· Unhealthy habits. °· Poor eating habits or  eating disorders. °· Alcohol and/or drug use . °· Lack of proper nutrition (malnutrition). °Psychological problems °· Stress and/or anxiety problems. °· Depression. °· Grief. °· Boredom. °Medical Problems or Conditions °· Anemia. °· Pregnancy. °· Thyroid gland problems. °· Recovery from major surgery. °· Continuous pain. °· Emphysema or asthma that is not well controlled °· Allergic conditions. °· Diabetes. °· Infections (such as mononucleosis). °· Obesity. °· Sleep disorders, such as sleep apnea. °· Heart failure or other heart-related problems. °· Cancer. °· Kidney disease. °· Liver disease. °· Effects of certain medicines such as antihistamines, cough and cold remedies, prescription pain medicines, heart and blood pressure medicines, drugs used for treatment of cancer, and some antidepressants. °SYMPTOMS  °The symptoms of fatigue include:  °· Lack of energy. °· Lack of drive (motivation). °· Drowsiness. °· Feeling of indifference to the surroundings. °DIAGNOSIS  °The details of how you feel help guide your caregiver in finding out what is causing the fatigue. You will be asked about your present and past health condition. It is important to review all medicines that you take, including prescription and non-prescription items. A thorough exam will be done. You will be questioned about your feelings, habits, and normal lifestyle. Your caregiver may suggest blood tests, urine tests, or other tests to look for common medical causes of fatigue.  °TREATMENT  °Fatigue is treated by correcting the underlying cause. For example, if you have continuous pain or depression, treating these causes will improve how you feel. Similarly, adjusting the dose of certain medicines will help in reducing fatigue.  °HOME CARE INSTRUCTIONS  °· Try to get the required amount of good sleep every night. °· Eat a healthy and nutritious diet, and drink enough water throughout the day. °· Practice ways of relaxing (including yoga or  meditation). °· Exercise regularly. °· Make plans to change situations that cause stress. Act on those plans so that stresses decrease over time. Keep your work and personal routine reasonable. °· Avoid street drugs and minimize use of alcohol. °· Start taking a daily multivitamin after consulting your caregiver. °SEEK MEDICAL CARE IF:  °· You have persistent tiredness, which cannot be accounted for. °· You have fever. °· You have unintentional weight loss. °· You have headaches. °· You have disturbed sleep throughout the night. °· You are feeling sad. °· You have constipation. °· You have dry skin. °· You have gained weight. °· You are taking any new or different medicines that you suspect are causing fatigue. °· You are unable to sleep at night. °· You develop any unusual swelling of your legs or other parts of your body. °SEEK IMMEDIATE MEDICAL CARE IF:  °· You are feeling confused. °· Your vision is blurred. °· You feel faint or pass out. °· You develop severe headache. °· You develop severe abdominal, pelvic, or back pain. °· You develop chest pain, shortness of breath, or an irregular or fast heartbeat. °· You are unable to pass a normal amount of urine. °· You develop abnormal bleeding such as bleeding from the rectum or you vomit blood. °· You have thoughts about harming   yourself or committing suicide. °· You are worried that you might harm someone else. °MAKE SURE YOU:  °· Understand these instructions. °· Will watch your condition. °· Will get help right away if you are not doing well or get worse. °Document Released: 01/04/2007 Document Revised: 06/01/2011 Document Reviewed: 01/04/2007 °[employer]® Patient Information ©2014 [employer]. ° °

## 2013-08-01 NOTE — Progress Notes (Signed)
Pre visit review using our clinic review tool, if applicable. No additional management support is needed unless otherwise documented below in the visit note. 

## 2013-08-04 ENCOUNTER — Other Ambulatory Visit: Payer: Self-pay | Admitting: Internal Medicine

## 2013-08-07 ENCOUNTER — Telehealth: Payer: Self-pay | Admitting: Internal Medicine

## 2013-08-07 NOTE — Telephone Encounter (Signed)
Pt states he is still waiting to hear about poss Testerone inj.  I see there is a referral and pt states he will fu w/ [facility]. Just wanting to know if you had heard anything

## 2013-08-07 NOTE — Telephone Encounter (Signed)
Referral note : Faxed to [facility]  [address], [place], [place] [address]  [phone]  awaiting appt   Once the referral is placed, we typically don't hear anything after that until we get a progress note from [facility].

## 2013-08-17 DIAGNOSIS — E291 Testicular hypofunction: Secondary | ICD-10-CM | POA: Diagnosis not present

## 2013-08-21 ENCOUNTER — Ambulatory Visit (INDEPENDENT_AMBULATORY_CARE_PROVIDER_SITE_OTHER): Payer: Medicare Other | Admitting: Neurology

## 2013-08-21 ENCOUNTER — Encounter: Payer: Self-pay | Admitting: Neurology

## 2013-08-21 VITALS — BP 106/64 | HR 77 | Ht 67.0 in | Wt 203.0 lb

## 2013-08-21 DIAGNOSIS — R42 Dizziness and giddiness: Secondary | ICD-10-CM | POA: Diagnosis not present

## 2013-08-21 DIAGNOSIS — E291 Testicular hypofunction: Secondary | ICD-10-CM | POA: Diagnosis not present

## 2013-08-21 DIAGNOSIS — I2510 Atherosclerotic heart disease of native coronary artery without angina pectoris: Secondary | ICD-10-CM

## 2013-08-21 DIAGNOSIS — G589 Mononeuropathy, unspecified: Secondary | ICD-10-CM | POA: Diagnosis not present

## 2013-08-21 DIAGNOSIS — J309 Allergic rhinitis, unspecified: Secondary | ICD-10-CM | POA: Diagnosis not present

## 2013-08-21 DIAGNOSIS — G629 Polyneuropathy, unspecified: Secondary | ICD-10-CM

## 2013-08-21 MED ORDER — GABAPENTIN 100 MG PO CAPS: ORAL_CAPSULE | ORAL | Status: DC

## 2013-08-21 NOTE — Patient Instructions (Signed)
1. Bloodwork for ESR, SPEP/UPEP with [facility] 2. Start gabapentin 175m: 4 caps at bedtime 3. Please call our office if dizziness worsens and we will discuss MRI brain at that time
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
## 2013-08-22 LAB — SEDIMENTATION RATE: Sed Rate: 4 mm/hr (ref 0–16)

## 2013-08-23 ENCOUNTER — Encounter: Payer: Self-pay | Admitting: Neurology

## 2013-08-23 DIAGNOSIS — R42 Dizziness and giddiness: Secondary | ICD-10-CM | POA: Insufficient documentation

## 2013-08-23 LAB — PROTEIN ELECTROPHORESIS, SERUM
Albumin ELP: 60.2 % (ref 55.8–66.1)
Alpha-1-Globulin: 4 % (ref 2.9–4.9)
Alpha-2-Globulin: 10.5 % (ref 7.1–11.8)
Beta 2: 4.4 % (ref 3.2–6.5)
Beta Globulin: 6 % (ref 4.7–7.2)
Gamma Globulin: 14.9 % (ref 11.1–18.8)
Total Protein, Serum Electrophoresis: 6.7 g/dL (ref 6.0–8.3)

## 2013-08-23 LAB — UIFE/LIGHT CHAINS/TP QN, 24-HR UR
Albumin, U: DETECTED
Free Kappa Lt Chains,Ur: 0.23 mg/dL (ref 0.14–2.42)
Free Kappa/Lambda Ratio: 7.67 ratio (ref 2.04–10.37)
Free Lambda Lt Chains,Ur: 0.03 mg/dL (ref 0.02–0.67)
Total Protein, Urine: 0.8 mg/dL

## 2013-08-23 LAB — IMMUNOFIXATION ELECTROPHORESIS
IgA: 117 mg/dL (ref 68–379)
IgG (Immunoglobin G), Serum: 1130 mg/dL (ref 650–1600)
IgM, Serum: 84 mg/dL (ref 41–251)
Total Protein, Serum Electrophoresis: 6.7 g/dL (ref 6.0–8.3)

## 2013-08-28 ENCOUNTER — Encounter: Payer: Self-pay | Admitting: Internal Medicine

## 2013-09-11 ENCOUNTER — Ambulatory Visit (INDEPENDENT_AMBULATORY_CARE_PROVIDER_SITE_OTHER): Payer: Medicare Other | Admitting: Family

## 2013-09-11 ENCOUNTER — Encounter: Payer: Self-pay | Admitting: Family

## 2013-09-11 VITALS — BP 104/62 | HR 88 | Temp 97.8°F | Ht 67.0 in | Wt 206.0 lb

## 2013-09-11 DIAGNOSIS — B9789 Other viral agents as the cause of diseases classified elsewhere: Secondary | ICD-10-CM | POA: Diagnosis not present

## 2013-09-11 DIAGNOSIS — B349 Viral infection, unspecified: Secondary | ICD-10-CM

## 2013-09-11 DIAGNOSIS — I2510 Atherosclerotic heart disease of native coronary artery without angina pectoris: Secondary | ICD-10-CM | POA: Diagnosis not present

## 2013-09-11 DIAGNOSIS — R5381 Other malaise: Secondary | ICD-10-CM

## 2013-09-11 DIAGNOSIS — R5383 Other fatigue: Secondary | ICD-10-CM | POA: Diagnosis not present

## 2013-09-11 DIAGNOSIS — R110 Nausea: Secondary | ICD-10-CM

## 2013-09-11 DIAGNOSIS — I10 Essential (primary) hypertension: Secondary | ICD-10-CM

## 2013-09-11 DIAGNOSIS — R197 Diarrhea, unspecified: Secondary | ICD-10-CM

## 2013-09-11 LAB — BASIC METABOLIC PANEL
BUN: 22 mg/dL (ref 6–23)
CO2: 31 mEq/L (ref 19–32)
Calcium: 9.4 mg/dL (ref 8.4–10.5)
Chloride: 91 mEq/L — ABNORMAL LOW (ref 96–112)
Creatinine, Ser: 1.4 mg/dL (ref 0.4–1.5)
GFR: 54.97 mL/min — ABNORMAL LOW (ref >60.00)
Glucose, Bld: 104 mg/dL — ABNORMAL HIGH (ref 70–99)
Potassium: 4.3 mEq/L (ref 3.5–5.1)
Sodium: 128 mEq/L — ABNORMAL LOW (ref 135–145)

## 2013-09-11 LAB — CBC WITH DIFFERENTIAL/PLATELET
Basophils Absolute: 0 10*3/uL (ref 0.0–0.1)
Basophils Relative: 0.5 % (ref 0.0–3.0)
Eosinophils Absolute: 0.3 10*3/uL (ref 0.0–0.7)
Eosinophils Relative: 3.7 % (ref 0.0–5.0)
HCT: 44.6 % (ref 39.0–52.0)
Hemoglobin: 15.3 g/dL (ref 13.0–17.0)
Lymphocytes Relative: 18.9 % (ref 12.0–46.0)
Lymphs Abs: 1.5 10*3/uL (ref 0.7–4.0)
MCHC: 34.3 g/dL (ref 30.0–36.0)
MCV: 92.3 fl (ref 78.0–100.0)
Monocytes Absolute: 0.9 10*3/uL (ref 0.1–1.0)
Monocytes Relative: 11.3 % (ref 3.0–12.0)
Neutro Abs: 5.2 10*3/uL (ref 1.4–7.7)
Neutrophils Relative %: 65.6 % (ref 43.0–77.0)
Platelets: 233 10*3/uL (ref 150.0–400.0)
RBC: 4.83 Mil/uL (ref 4.22–5.81)
RDW: 12.3 % (ref 11.5–15.5)
WBC: 8 10*3/uL (ref 4.0–10.5)

## 2013-09-11 MED ORDER — SIMVASTATIN 20 MG PO TABS: 20.0000 mg | ORAL_TABLET | Freq: Every day | ORAL | Status: DC

## 2013-09-11 MED ORDER — LOSARTAN POTASSIUM-HCTZ 100-25 MG PO TABS: 1.0000 | ORAL_TABLET | Freq: Every day | ORAL | Status: DC

## 2013-09-11 NOTE — Patient Instructions (Signed)

## 2013-09-11 NOTE — Progress Notes (Signed)
Subjective:    Patient ID: Joshua [name], male    DOB: 25-Oct-1940, 73 y.o.   MRN: [id number]  Sinusitis Associated symptoms include headaches, sinus pressure and a sore throat. Pertinent negatives include no congestion or ear pain.    73 year old nonsmoking Caucasian male presents with acute issues of sore throat, frontal pressure, headache, diarrhea and nausea since Friday. He also reports sore throat since Friday. He experienced a dry cough with onset of symptoms, none since.  He describes intermittent chest pressure since onset of symptoms. The pain is not reproducible. He denies palpitations, claudication, tingling or loss of sensation in extremities, or radiating pain.   He traveled over the weekend and reports decreased sleep, [place] most [place]. After returning home on Friday, he increased his water intake.   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    His PMH is complicated by CAD, 5-bypass in 2001, GERD, OSA, prostate CA, and 30-year smoking history.   He also requests a medication change: Crestor and Benicar to generic.   Review of Systems  Constitutional: Negative.   HENT: Positive for sinus pressure and sore throat. Negative for congestion, ear discharge, ear pain and facial swelling.   Eyes: Negative.  Negative for discharge and itching.  Respiratory: Negative.   Cardiovascular: Positive for chest pain.       Described as intermittent pressure, does not radiate   Gastrointestinal: Positive for nausea and diarrhea.  Musculoskeletal: Negative.   Skin: Negative.   Allergic/Immunologic: Negative.   Neurological: Positive for headaches.  Hematological: Negative.   Psychiatric/Behavioral: Negative.    Past Medical History  Diagnosis Date  . Asthma   . CAD (coronary artery disease)     remote CABG 2001  . Depression   . GERD  (gastroesophageal reflux disease)   . Hyperlipidemia   . IBS (irritable bowel syndrome)   . Acute medial meniscal tear   . DJD (degenerative joint disease)   . Hypertension   . Anemia   . Diverticulosis   . Obesity   . Pancreatitis   . PUD (peptic ulcer disease)   . Pneumonia   . Sleep apnea   . Prostate cancer   . Acute sinusitis 12/14/2006    Qualifier: Diagnosis of  By: Arnoldo Morale MD, Balinda Quails     History   Social History  . Marital Status: Married    Spouse Name: N/A    Number of Children: N/A  . Years of Education: N/A   Occupational History  . retired    Social History Main Topics  . Smoking status: Former Smoker -- 1.00 packs/day for 35 years    Types: Cigarettes    Quit date: 06/16/1992  . Smokeless tobacco: Never Used     Comment: smoked 1ppd for 35 years  . Alcohol Use: Yes     Comment: occ  . Drug Use: No  . Sexual Activity: Yes   Other Topics Concern  . Not on file   Social History Narrative  . No narrative on file    Past Surgical History  Procedure Laterality Date  . Thyroidectomy, partial    . Coronary artery bypass graft    . Cholecystectomy    . Cervical fusion    . Cervical laminectomy  05/26/06  . Arthroscopy rt knee  04/2008    daldoerf  . Prostatectomy  04/2010    Family History  Problem Relation Age of Onset  . Heart disease Father   . Heart disease Mother     Allergies  Allergen Reactions  . Azithromycin     Dizziness and double vision   . Bromfed     REACTION: n\T\v  . Clarithromycin     REACTION: gastritis  . Codeine   . Doxycycline Hyclate     REACTION: n\T\v  . Erythromycin Base     REACTION: rash  . Hydrocodone-Acetaminophen     REACTION: rash  . Modafinil     REACTION: anxiety-nervousness  . Promethazine Hcl     REACTION: fainting  . Sulfonamide Derivatives     REACTION: rash  . Telithromycin     REACTION: nausea    Current Outpatient Prescriptions on File Prior to Visit  Medication Sig Dispense Refill  .  aspirin 81 MG tablet Take 81 mg by mouth at bedtime.       . Coenzyme Q10 (CO Q 10 PO) Take 1 tablet by mouth daily.      . CRESTOR 10 MG tablet Take one tablet by mouth nightly at bedtime  30 tablet  9  . desloratadine (CLARINEX) 5 MG tablet Take 5 mg by mouth at bedtime.       . diazepam (VALIUM) 5 MG tablet Take one tablet by mouth nightly at bedtime  as needed  30 tablet  3  . Fish Oil-Cholecalciferol (FISH OIL + D3 PO) Take by mouth.      . fluticasone (FLONASE) 50 MCG/ACT nasal spray 2 sprays  Each nostril qd  16 g  11  . folic acid (FOLVITE) 1 MG tablet Take 1 mg by mouth daily.        [name] gabapentin (NEURONTIN) 100 MG capsule Take 4 caps at bedtime  120 capsule  6  . LAC-HYDRIN FIVE 5 % LOTN lotion APPLY TO AFFECTED AREA(S) TWICE DAILY  120 mL  2  . olmesartan-hydrochlorothiazide (BENICAR HCT) 40-25 MG per tablet Take 1 tablet by mouth daily.  90 tablet  4  . omeprazole (PRILOSEC) 20 MG capsule Take 1 capsule by mouth twice a day with samples as directed  60 capsule  4  . ondansetron (ZOFRAN) 8 MG tablet Take 1 tablet (8 mg total) by mouth every 8 (eight) hours as needed for nausea.  20 tablet  0  . OVER THE COUNTER MEDICATION [place] Gooseberry Juice      . polyethylene glycol (MIRALAX / GLYCOLAX) packet Take 17 g by mouth daily.        . Probiotic Product (ALIGN) 4 MG CAPS Take 1 tablet by mouth daily.       [name] QVAR 80 MCG/ACT inhaler Inhale 2 puffs into the lungs daily  8.7 g  5  . vitamin B-12 (CYANOCOBALAMIN) 100 MCG tablet Take 500 mcg by mouth daily.        No current facility-administered medications on file prior to visit.    BP 104/62  Pulse 88  Temp(Src) 97.8 F (36.6 C) (Oral)  Ht 5\' 7"  (1.702 m)  Wt 206 lb (93.441 kg)  BMI 32.26 kg/m2  SpO2 97%     Objective:   Physical Exam  Constitutional: He is oriented to person, place, and time. He appears well-developed and well-nourished. No distress.  HENT:  Head: Normocephalic and atraumatic.  Nose: Nose normal.    Mouth/Throat: Oropharynx is clear and moist. No oropharyngeal exudate.  Bilateral hearing aids   Eyes: EOM  are normal. Pupils are equal, round, and reactive to light. Right eye exhibits no discharge. Left eye exhibits no discharge. No scleral icterus.  Neck: Normal range of motion. Neck supple.  Cardiovascular: Normal rate, regular rhythm and normal heart sounds.  Exam reveals no gallop and no friction rub.   No murmur heard. Pulmonary/Chest: Effort normal and breath sounds normal. No stridor. No respiratory distress. He has no wheezes. He has no rales. He exhibits no tenderness.  Abdominal: Soft. He exhibits no distension and no mass. There is no tenderness. There is no rebound and no guarding.  Hyperactive BS.   Musculoskeletal: Normal range of motion. He exhibits no edema.  Lymphadenopathy:    He has no cervical adenopathy.  Neurological: He is alert and oriented to person, place, and time.  Skin: Skin is warm and dry. No rash noted. He is not diaphoretic.  Psychiatric: He has a normal mood and affect. His behavior is normal. Judgment and thought content normal.      Assessment & Plan:   Problem List Items Addressed This Visit   HYPERTENSION   Relevant Medications      simvastatin (ZOCOR) tablet      LOSARTAN POTASSIUM-HCTZ 100-25 MG PO TABS   Other Relevant Orders      EKG 12-Lead (Completed)    Other Visit Diagnoses   Viral illness    -  Primary    Other malaise and fatigue        Relevant Orders       EKG 12-Lead (Completed)       CBC with Differential       Basic Metabolic Panel    Nausea alone        Diarrhea         Call the office with any questions or concerns. Recheck as scheduled and as needed.

## 2013-09-11 NOTE — Progress Notes (Signed)
Pre visit review using our clinic review tool, if applicable. No additional management support is needed unless otherwise documented below in the visit note. 

## 2013-09-12 ENCOUNTER — Telehealth: Payer: Self-pay | Admitting: Internal Medicine

## 2013-09-12 ENCOUNTER — Encounter: Payer: Self-pay | Admitting: Pulmonary Disease

## 2013-09-12 ENCOUNTER — Ambulatory Visit (INDEPENDENT_AMBULATORY_CARE_PROVIDER_SITE_OTHER): Payer: Medicare Other | Admitting: Pulmonary Disease

## 2013-09-12 VITALS — BP 120/78 | HR 74 | Temp 98.0°F | Ht 65.75 in | Wt 204.0 lb

## 2013-09-12 DIAGNOSIS — G4733 Obstructive sleep apnea (adult) (pediatric): Secondary | ICD-10-CM

## 2013-09-12 DIAGNOSIS — I2510 Atherosclerotic heart disease of native coronary artery without angina pectoris: Secondary | ICD-10-CM | POA: Diagnosis not present

## 2013-09-12 NOTE — Progress Notes (Signed)
   Subjective:    Patient ID: Joshua [name], male    DOB: 04-13-1940, 73 y.o.   MRN: [id number]  HPI Patient comes in today for an acute sick visit. He is been seen in the past for daytime sleepiness, and was found to have the restless leg syndrome. He also had mild obstructive sleep apnea, but was not overly symptomatic at that time. The decision was made to treat him for RLS first before considering treatment of his very mild sleep disordered breathing. The patient was tried on Requip, but discontinued the medicine because he felt it did not help. He was placed on gabapentin for chronic pain, and felt this helped his leg symptoms more than anything else. He was also found to have iron deficiency anemia which was resolved.  The patient comes in today where he is continuing to have daytime sleepiness and fatigue despite his legs not really being a problem. He is concerned that his sleep apnea has worsened from the initial study.   Review of Systems  Constitutional: Positive for fatigue. Negative for fever and unexpected weight change.  HENT: Negative for congestion, dental problem, ear pain, nosebleeds, postnasal drip, rhinorrhea, sinus pressure, sneezing, sore throat and trouble swallowing.   Eyes: Negative for redness and itching.  Respiratory: Negative for cough, chest tightness, shortness of breath and wheezing.   Cardiovascular: Negative for palpitations and leg swelling.  Gastrointestinal: Negative for nausea and vomiting.  Genitourinary: Negative for dysuria.  Musculoskeletal: Negative for joint swelling.  Skin: Negative for rash.  Neurological: Negative for headaches.  Hematological: Does not bruise/bleed easily.  Psychiatric/Behavioral: Negative for dysphoric mood. The patient is not nervous/anxious.        Objective:   Physical Exam Overweight male in no acute distress Nose without purulence or discharge noted Neck without lymphadenopathy or thyromegaly Chest is clear to  auscultation Lower extremities with no significant edema, no cyanosis Alert and oriented, moves all 4 extremities.       Assessment & Plan:

## 2013-09-12 NOTE — Patient Instructions (Signed)
Will schedule you for a sleep study given your persistent sleepiness despite your leg movements improved. Will call once the results are available.

## 2013-09-12 NOTE — Assessment & Plan Note (Signed)
The patient has a history of mild sleep apnea from 6 years ago, but was initially having a lot of sleep disruption and daytime sleepiness secondary to the restless leg syndrome and chronic pain. He was treated with Requip without benefit, but did improve with gabapentin. This makes me wonder whether chronic pain was more of an issue for him, although gabapentin can also be used for RLS.  The patient was also noted to have iron deficiency anemia, and this has since resolved. Iron deficiency is known to be a cause of RLS, and perhaps resolving this issue help his limb movements more than anything else. Regardless, he is having persistent daytime sleepiness, and is concerned that his sleep apnea has worsened and is causing this.  We'll therefore arrange for a repeat sleep study to evaluate this.

## 2013-09-12 NOTE — Telephone Encounter (Addendum)
Pt was on benicar 40-25 mg now on losartan 100-25 mg also pt was taking crestor 10 mg and now on simvastatin 20 mg. Pt would like to confirm new rxs. Pt was seen yesterday

## 2013-09-12 NOTE — Telephone Encounter (Signed)
Medication changes clarified and verified with pt. Pt should be taking simvastatin 20mg  qhs and Hyzaar 100-25mg  qd. Pt verbalized understanding

## 2013-09-15 DIAGNOSIS — J309 Allergic rhinitis, unspecified: Secondary | ICD-10-CM | POA: Diagnosis not present

## 2013-09-18 ENCOUNTER — Encounter: Payer: Medicare Other | Admitting: Internal Medicine

## 2013-09-24 ENCOUNTER — Encounter: Payer: Self-pay | Admitting: Family

## 2013-09-25 NOTE — Telephone Encounter (Signed)
Ok, thanks for letting me know!

## 2013-09-29 DIAGNOSIS — J309 Allergic rhinitis, unspecified: Secondary | ICD-10-CM | POA: Diagnosis not present

## 2013-10-09 ENCOUNTER — Ambulatory Visit (INDEPENDENT_AMBULATORY_CARE_PROVIDER_SITE_OTHER): Payer: Medicare Other | Admitting: Family Medicine

## 2013-10-09 ENCOUNTER — Encounter: Payer: Self-pay | Admitting: Family Medicine

## 2013-10-09 VITALS — BP 120/80 | HR 68 | Temp 97.8°F | Wt 200.0 lb

## 2013-10-09 DIAGNOSIS — K589 Irritable bowel syndrome without diarrhea: Secondary | ICD-10-CM | POA: Diagnosis not present

## 2013-10-09 DIAGNOSIS — C61 Malignant neoplasm of prostate: Secondary | ICD-10-CM | POA: Diagnosis not present

## 2013-10-09 DIAGNOSIS — I10 Essential (primary) hypertension: Secondary | ICD-10-CM

## 2013-10-09 DIAGNOSIS — E785 Hyperlipidemia, unspecified: Secondary | ICD-10-CM

## 2013-10-09 DIAGNOSIS — K219 Gastro-esophageal reflux disease without esophagitis: Secondary | ICD-10-CM

## 2013-10-09 MED ORDER — TRAMADOL HCL 50 MG PO TABS
50.0000 mg | ORAL_TABLET | Freq: Three times a day (TID) | ORAL | Status: DC | PRN
Start: 2013-10-09 — End: 2013-12-28

## 2013-10-09 NOTE — Progress Notes (Signed)
[name], MD Phone: [phone]  Subjective:   Joshua [name] is a 73 y.o. year old very pleasant male patient who presents with the following:  IBS Patient with flare of IBS pain over last week. He has had extensive GI workup in the past (see note from 02/18/2010). Patient states he over exerted himself at work 2 weeks ago. Over the next week developed worsening diffuse abdominal pain worse over epigastric area. Describes as mild cramping throughout day, not worsened by meals. . During this time, he then developed worsening of his pain from his chronic neuropathy for which he has previously seen neurology as well as his penile pain which is followed by [facility] and for which he has had PT. He states he has been trying to rest some and symptoms are gradually improving. He does admit to some worsening stool patterns with small pebbles (but are not difficult to past) over last week or so. He is taking his miralax, fiber. He has been trying mylaanta as well.  ROS- no nausea/vomiting. No rectal bleeding-melena or BRBPR. Admits to worsening fatigue with IBS flares.   Past Medical History-restless legs, prostate cancer-adenocarcinoma gle[address] gra[address] 6, IBS with history epigastric abdominal pain, anemia, HTN, OSA, HLD, depression, CAD. No fever/chills/weight changes.   Medications- reviewed and updated Current Outpatient Prescriptions  Medication Sig Dispense Refill  . aspirin 81 MG tablet Take 81 mg by mouth at bedtime.       . Coenzyme Q10 (CO Q 10 PO) Take 1 tablet by mouth daily.      [name] desloratadine (CLARINEX) 5 MG tablet Take 5 mg by mouth at bedtime.       . diazepam (VALIUM) 5 MG tablet Take one tablet by mouth nightly at bedtime  as needed  30 tablet  3  . Fish Oil-Cholecalciferol (FISH OIL + D3 PO) Take by mouth.      . fluticasone (FLONASE) 50 MCG/ACT nasal spray 2 sprays  Each nostril qd  16 g  11  . folic acid (FOLVITE) 1 MG tablet Take 1 mg by mouth daily.        [name]  gabapentin (NEURONTIN) 100 MG capsule Take 1 in AM, 3 in evening      . LAC-HYDRIN FIVE 5 % LOTN lotion APPLY TO AFFECTED AREA(S) TWICE DAILY  120 mL  2  . olmesartan-hydrochlorothiazide (BENICAR HCT) 40-25 MG per tablet Take 1 tablet by mouth daily.      [name] omeprazole (PRILOSEC) 20 MG capsule Take 1 capsule by mouth twice a day with samples as directed  60 capsule  4  . ondansetron (ZOFRAN) 8 MG tablet Take 1 tablet (8 mg total) by mouth every 8 (eight) hours as needed for nausea.  20 tablet  0  . OVER THE COUNTER MEDICATION [place] Gooseberry Juice      . polyethylene glycol (MIRALAX / GLYCOLAX) packet Take 17 g by mouth daily.        . Probiotic Product (ALIGN) 4 MG CAPS Take 1 tablet by mouth daily.       [name] QVAR 80 MCG/ACT inhaler Inhale 2 puffs into the lungs daily  8.7 g  5  . simvastatin (ZOCOR) 20 MG tablet Take 1 tablet (20 mg total) by mouth at bedtime.  30 tablet  3  . vitamin B-12 (CYANOCOBALAMIN) 100 MCG tablet Take 500 mcg by mouth daily.          Objective: BP 120/80  Pulse 68  Temp(Src) 97.8 F (36.6 C) (  Oral)  Wt 200 lb (90.719 kg) Gen: NAD, resting comfortably on table CV: RRR no murmurs rubs or gallops Lungs: CTAB no crackles, wheeze, rhonchi Abdomen: soft/nondistended/normal bowel sounds. No rebound or guarding. Obese. Mild diffuse but distractible tenderness.  Ext: no edema Skin: warm, dry, no rash, some bruising on hands and arms (on aspirin)  Assessment/Plan:  Irritable bowel syndrome Flare of chronic IBS. Exam reassuring with no signs of acute abdomen/GI etiology. Doubt ischemia as a chronic mild cramping. Symptoms already improving by time of visit. As constipation seems to have worsened, will start miralax BID instead of daily, tramadol prn for pain was used by GI in past with good relief. Reasons for follow up discussed and patient has physical planned with me in the next month. If no improvement with above treatment plan, would consider cbc, cmet, lipase and  follow up with GI.    Meds ordered this encounter  Medications  . olmesartan-hydrochlorothiazide (BENICAR HCT) 40-25 MG per tablet    Sig: Take 1 tablet by mouth daily.  . traMADol (ULTRAM) 50 MG tablet    Sig: Take 1 tablet (50 mg total) by mouth every 8 (eight) hours as needed for moderate pain or severe pain.    Dispense:  15 tablet    Refill:  0

## 2013-10-09 NOTE — Patient Instructions (Signed)
IBS flare  I would take Miralax twice daily for 1 week  Tramadol as needed for pain (neuropathy, penile pain, or abdominal pain)  Call or return to clinic prn if these symptoms worsen or fail to improve as anticipated.  See you soon for your physical. Wonderful meeting you! Dr. Yong Channel  P.s. Please mention testosterone concerns at physical as we may order initial labs then have you back to discuss results.

## 2013-10-09 NOTE — Progress Notes (Signed)
Pre visit review using our clinic review tool, if applicable. No additional management support is needed unless otherwise documented below in the visit note. 

## 2013-10-09 NOTE — Assessment & Plan Note (Addendum)
Flare of chronic IBS. Exam reassuring with no signs of acute abdomen/GI etiology. Doubt ischemia as a chronic mild cramping. Symptoms already improving by time of visit. As constipation seems to have worsened, will start miralax BID instead of daily, tramadol prn for pain was used by GI in past with good relief. Reasons for follow up discussed and patient has physical planned with me in the next month. If no improvement with above treatment plan, would consider cbc, cmet, lipase and follow up with GI.

## 2013-10-13 DIAGNOSIS — J309 Allergic rhinitis, unspecified: Secondary | ICD-10-CM | POA: Diagnosis not present

## 2013-10-16 ENCOUNTER — Encounter: Payer: Self-pay | Admitting: Cardiology

## 2013-10-16 ENCOUNTER — Ambulatory Visit (INDEPENDENT_AMBULATORY_CARE_PROVIDER_SITE_OTHER): Payer: Medicare Other | Admitting: Cardiology

## 2013-10-16 VITALS — BP 128/73 | HR 76 | Ht 63.0 in | Wt 200.0 lb

## 2013-10-16 DIAGNOSIS — I10 Essential (primary) hypertension: Secondary | ICD-10-CM | POA: Diagnosis not present

## 2013-10-16 DIAGNOSIS — I2510 Atherosclerotic heart disease of native coronary artery without angina pectoris: Secondary | ICD-10-CM

## 2013-10-16 NOTE — Progress Notes (Signed)
HPI  The patient returns for one year follow up.  Since I last saw him he has done well.  He is exercising routinely and still volunteers and works part time.  He is being evaluated for tiredness.  I did review recent labs.  He has a low testosterone and chronically low Na.  He is also being evaluated for sleep apnea.  He stopped having depression treated because the meds made him tired.  The patient denies any new symptoms such as chest discomfort, neck or arm discomfort. There has been no new shortness of breath, PND or orthopnea. There have been no reported palpitations, presyncope or syncope.  Allergies  Allergen Reactions  . Azithromycin     Dizziness and double vision   . Bromfed     REACTION: n\T\v  . Clarithromycin     REACTION: gastritis  . Codeine   . Doxycycline Hyclate     REACTION: n\T\v  . Erythromycin Base     REACTION: rash  . Hydrocodone-Acetaminophen     REACTION: rash  . Modafinil     REACTION: anxiety-nervousness  . Promethazine Hcl     REACTION: fainting  . Sulfonamide Derivatives     REACTION: rash  . Telithromycin     REACTION: nausea    Current Outpatient Prescriptions  Medication Sig Dispense Refill  . aspirin 81 MG tablet Take 81 mg by mouth at bedtime.       . Coenzyme Q10 (CO Q 10 PO) Take 1 tablet by mouth daily.      [name] desloratadine (CLARINEX) 5 MG tablet Take 5 mg by mouth at bedtime.       . diazepam (VALIUM) 5 MG tablet Take one tablet by mouth nightly at bedtime  as needed  30 tablet  3  . Fish Oil-Cholecalciferol (FISH OIL + D3 PO) Take by mouth.      . fluticasone (FLONASE) 50 MCG/ACT nasal spray 2 sprays  Each nostril qd  16 g  11  . folic acid (FOLVITE) 1 MG tablet Take 1 mg by mouth daily.        [name] gabapentin (NEURONTIN) 100 MG capsule Take 1 in AM, 3 in evening      . LAC-HYDRIN FIVE 5 % LOTN lotion APPLY TO AFFECTED AREA(S) TWICE DAILY  120 mL  2  . olmesartan-hydrochlorothiazide (BENICAR HCT) 40-25 MG per tablet Take 1 tablet by  mouth daily.      [name] omeprazole (PRILOSEC) 20 MG capsule Take 1 capsule by mouth twice a day with samples as directed  60 capsule  4  . ondansetron (ZOFRAN) 8 MG tablet Take 1 tablet (8 mg total) by mouth every 8 (eight) hours as needed for nausea.  20 tablet  0  . OVER THE COUNTER MEDICATION [place] Gooseberry Juice      . polyethylene glycol (MIRALAX / GLYCOLAX) packet Take 17 g by mouth daily.        . Probiotic Product (ALIGN) 4 MG CAPS Take 1 tablet by mouth daily.       [name] QVAR 80 MCG/ACT inhaler Inhale 2 puffs into the lungs daily  8.7 g  5  . simvastatin (ZOCOR) 20 MG tablet Take 1 tablet (20 mg total) by mouth at bedtime.  30 tablet  3  . traMADol (ULTRAM) 50 MG tablet Take 1 tablet (50 mg total) by mouth every 8 (eight) hours as needed for moderate pain or severe pain.  15 tablet  0  . vitamin B-12 (  CYANOCOBALAMIN) 100 MCG tablet Take 500 mcg by mouth daily.        No current facility-administered medications for this visit.    Past Medical History  Diagnosis Date  . Asthma   . CAD (coronary artery disease)     remote CABG 2001  . Depression   . GERD (gastroesophageal reflux disease)   . Hyperlipidemia   . IBS (irritable bowel syndrome)   . Acute medial meniscal tear   . DJD (degenerative joint disease)   . Hypertension   . Anemia   . Diverticulosis   . Obesity   . Pancreatitis   . PUD (peptic ulcer disease)   . Pneumonia   . Sleep apnea   . Prostate cancer   . Acute sinusitis 12/14/2006    Qualifier: Diagnosis of  By: Arnoldo Morale MD, Balinda Quails     Past Surgical History  Procedure Laterality Date  . Thyroidectomy, partial    . Coronary artery bypass graft    . Cholecystectomy    . Cervical fusion    . Cervical laminectomy  05/26/06  . Arthroscopy rt knee  04/2008    daldoerf  . Prostatectomy  04/2010    ROS: As stated in the HPI and negative for all other systems.   PHYSICAL EXAM BP 128/73  Pulse 76  Ht 5\' 3"  (1.6 m)  Wt 200 lb (90.719 kg)  BMI 35.44  kg/m2 GENERAL:  Well appearing NECK:  No jugular venous distention, waveform within normal limits, carotid upstroke brisk and symmetric, no bruits, no thyromegaly LUNGS:  Clear to auscultation bilaterally CHEST:  Well healed sternotomy scar. HEART:  PMI not displaced or sustained,S1 and S2 within normal limits, no S3, no S4, no clicks, no rubs, no murmurs ABD:  Flat, positive bowel sounds normal in frequency in pitch, no bruits, no rebound, no guarding, no midline pulsatile mass, no hepatomegaly, no splenomegaly EXT:  2 plus pulses throughout, no edema, no cyanosis no clubbing   EKG:  Sinus rhythm, rate 75, axis within normal limits, poor anterior R wave progression, no acute ST-T wave changes, rightward axis.  09/11/13  ASSESSMENT AND PLAN  CORONARY ARTERY DISEASE -  He has had no new symptoms since his treadmill test last year. No change in therapy is indicated. He will continue with risk reduction. I plan on a POET (Plain Old Exercise Treadmill) next year prior to follow up.    HYPERLIPIDEMIA -   He had an excellent lipid profile last year and I will defer to his provider.  He is going to get this checked again in October  HTN - The blood pressure is at target. No change in medications is indicated. We will continue with therapeutic lifestyle changes (TLC).  FATIGUE - He has multiple reasons that he might be having this (depresison, possible sleep apnea, hyponatremia, low testosterone).  No further cardiac work up is planned.

## 2013-10-17 DIAGNOSIS — F331 Major depressive disorder, recurrent, moderate: Secondary | ICD-10-CM | POA: Diagnosis not present

## 2013-10-19 ENCOUNTER — Ambulatory Visit (INDEPENDENT_AMBULATORY_CARE_PROVIDER_SITE_OTHER): Payer: Medicare Other | Admitting: Family Medicine

## 2013-10-19 ENCOUNTER — Encounter: Payer: Self-pay | Admitting: Family Medicine

## 2013-10-19 VITALS — BP 120/82 | HR 72 | Temp 97.8°F | Wt 201.0 lb

## 2013-10-19 DIAGNOSIS — K589 Irritable bowel syndrome without diarrhea: Secondary | ICD-10-CM | POA: Diagnosis not present

## 2013-10-19 DIAGNOSIS — F329 Major depressive disorder, single episode, unspecified: Secondary | ICD-10-CM

## 2013-10-19 DIAGNOSIS — R109 Unspecified abdominal pain: Secondary | ICD-10-CM

## 2013-10-19 DIAGNOSIS — F3289 Other specified depressive episodes: Secondary | ICD-10-CM | POA: Diagnosis not present

## 2013-10-19 LAB — CBC WITH DIFFERENTIAL/PLATELET
Basophils Absolute: 0 10*3/uL (ref 0.0–0.1)
Basophils Relative: 0.3 % (ref 0.0–3.0)
Eosinophils Absolute: 0.2 10*3/uL (ref 0.0–0.7)
Eosinophils Relative: 3.1 % (ref 0.0–5.0)
HCT: 43 % (ref 39.0–52.0)
Hemoglobin: 14.9 g/dL (ref 13.0–17.0)
Lymphocytes Relative: 18.9 % (ref 12.0–46.0)
Lymphs Abs: 1.2 10*3/uL (ref 0.7–4.0)
MCHC: 34.6 g/dL (ref 30.0–36.0)
MCV: 91.9 fl (ref 78.0–100.0)
Monocytes Absolute: 0.7 10*3/uL (ref 0.1–1.0)
Monocytes Relative: 10.7 % (ref 3.0–12.0)
Neutro Abs: 4.3 10*3/uL (ref 1.4–7.7)
Neutrophils Relative %: 67 % (ref 43.0–77.0)
Platelets: 224 10*3/uL (ref 150.0–400.0)
RBC: 4.68 Mil/uL (ref 4.22–5.81)
RDW: 12.8 % (ref 11.5–15.5)
WBC: 6.4 10*3/uL (ref 4.0–10.5)

## 2013-10-19 LAB — COMPREHENSIVE METABOLIC PANEL
ALT: 29 U/L (ref 0–53)
AST: 24 U/L (ref 0–37)
Albumin: 3.8 g/dL (ref 3.5–5.2)
Alkaline Phosphatase: 49 U/L (ref 39–117)
BUN: 20 mg/dL (ref 6–23)
CO2: 29 mEq/L (ref 19–32)
Calcium: 9.2 mg/dL (ref 8.4–10.5)
Chloride: 99 mEq/L (ref 96–112)
Creatinine, Ser: 1.5 mg/dL (ref 0.4–1.5)
GFR: 50.63 mL/min — ABNORMAL LOW (ref >60.00)
Glucose, Bld: 95 mg/dL (ref 70–99)
Potassium: 3.6 mEq/L (ref 3.5–5.1)
Sodium: 133 mEq/L — ABNORMAL LOW (ref 135–145)
Total Bilirubin: 0.9 mg/dL (ref 0.2–1.2)
Total Protein: 7 g/dL (ref 6.0–8.3)

## 2013-10-19 LAB — LIPASE: Lipase: 14 U/L (ref 11.0–59.0)

## 2013-10-19 NOTE — Progress Notes (Signed)
[name], MD Phone: [phone]  Subjective:   Joshua [name] is a 73 y.o. year old very pleasant male patient who presents with the following:  IBS Abdominal Pain/Bloating likely IBS Seen a week ago for diffuse abdominal pain worse over epigastric area with mild cramping throughout day. Not worse with meals. He had been dealing with small pebble like stools but without straining during that time.  He was taking his miralax, fiber. He has been trying mylaanta as well. At time of appointment, we doubled miralax and advised follow up if not improving as thought constipation playing a role.   Today, patient states that his symptoms were much improved with double miralax but he was having 3-4 loose stools per day. He stopped taking it and by Tuesday he started having abdominal pain, bloating without nausea/vomiting. He started the miralax back that evening but has continued to have multiple loose stools but feels that he is straining with him. His pain has become minimal now down to about 1/10 tenderness diffusely. His main complaint at this point is continued bloating not being helped by gas-x.   ROS- no nausea/vomiting. No rectal bleeding-melena or BRBPR. Admits to worsening fatigue with IBS flares. Does have some chronic penile burning/pain which he sees urology for with no recent changes. No recentweight changes.   Depression Patient mentioned recent worsening of depressive symptoms. He started seeing a counselor last week that he has seen in the past. PHQ 9 11 ROS-NO SI/HI  Past Medical History-restless legs, prostate cancer-adenocarcinoma gle[address] 6, IBS with history epigastric abdominal pain, anemia, HTN, OSA, HLD, depression, CAD.   Medications- reviewed and updated Current Outpatient Prescriptions  Medication Sig Dispense Refill  . aspirin 81 MG tablet Take 81 mg by mouth at bedtime.       . Coenzyme Q10 (CO Q 10 PO) Take 1 tablet by mouth daily.      [name] desloratadine  (CLARINEX) 5 MG tablet Take 5 mg by mouth at bedtime.       . diazepam (VALIUM) 5 MG tablet Take one tablet by mouth nightly at bedtime  as needed  30 tablet  3  . Fish Oil-Cholecalciferol (FISH OIL + D3 PO) Take by mouth.      . fluticasone (FLONASE) 50 MCG/ACT nasal spray 2 sprays  Each nostril qd  16 g  11  . folic acid (FOLVITE) 1 MG tablet Take 1 mg by mouth daily.        [name] gabapentin (NEURONTIN) 100 MG capsule Take 1 in AM, 3 in evening      . LAC-HYDRIN FIVE 5 % LOTN lotion APPLY TO AFFECTED AREA(S) TWICE DAILY  120 mL  2  . olmesartan-hydrochlorothiazide (BENICAR HCT) 40-25 MG per tablet Take 1 tablet by mouth daily.      [name] omeprazole (PRILOSEC) 20 MG capsule Take 1 capsule by mouth twice a day with samples as directed  60 capsule  4  . ondansetron (ZOFRAN) 8 MG tablet Take 1 tablet (8 mg total) by mouth every 8 (eight) hours as needed for nausea.  20 tablet  0  . OVER THE COUNTER MEDICATION [place] Gooseberry Juice      . polyethylene glycol (MIRALAX / GLYCOLAX) packet Take 17 g by mouth daily.        . Probiotic Product (ALIGN) 4 MG CAPS Take 1 tablet by mouth daily.       [name] QVAR 80 MCG/ACT inhaler Inhale 2 puffs into the lungs daily  8.7 g  5  . simvastatin (ZOCOR) 20 MG tablet Take 1 tablet (20 mg total) by mouth at bedtime.  30 tablet  3  . vitamin B-12 (CYANOCOBALAMIN) 100 MCG tablet Take 500 mcg by mouth daily.        Objective: BP 120/82  Pulse 72  Temp(Src) 97.8 F (36.6 C)  Wt 201 lb (91.173 kg) Gen: NAD, resting comfortably on table CV: RRR no murmurs rubs or gallops Lungs: CTAB no crackles, wheeze, rhonchi Abdomen: soft/nondistended/normal bowel sounds. No rebound or guarding. Obese. Mild diffuse but distractible tenderness, mildly worse in epigastric area.  Ext: no edema Skin: warm, dry, no rash  Assessment/Plan:  Irritable bowel syndrome I suspect abdominal pain and bloating are related to IBS still. Benign exam once again. Flare in pain on Monday and Tuesday  when decreasing miralax but patient having difficulty tolerating twice daily miralax. Back on twice daily miralax pain has almost resolved but bloating still bothering patient. Check CBC/cmet/lipase today. No food relation so doubt this is gallbladder related. Discussed if pain persists or worsens, we will refer back to GI.   DEPRESSION phq9 of 11. Patient has restarted his therapy with counselor. I encouraged regular follow up. Worsening depression could be related to worsening IBS symptoms as well. Patient opts to not restart SSRI at this time.

## 2013-10-19 NOTE — Assessment & Plan Note (Addendum)
I suspect abdominal pain and bloating are related to IBS still. Benign exam once again. Flare in pain on Monday and Tuesday when decreasing miralax but patient having difficulty tolerating twice daily miralax. Back on twice daily miralax pain has almost resolved but bloating still bothering patient. Check CBC/cmet/lipase today. No food relation so doubt this is gallbladder related. Discussed if pain persists or worsens, we will refer back to GI.

## 2013-10-19 NOTE — Assessment & Plan Note (Signed)
phq9 of 11. Patient has restarted his therapy with counselor. I encouraged regular follow up. Worsening depression could be related to worsening IBS symptoms as well. Patient opts to not restart SSRI at this time.

## 2013-10-19 NOTE — Patient Instructions (Signed)
Check labs today. If normal and only some bloating persists, will hold off on further workup.   If bloating worsens or pain recurs, you can send me a message and we can refer you to GI.   We will check in August 20th or whenever your physical is unless pain worsens.

## 2013-10-22 ENCOUNTER — Encounter: Payer: Self-pay | Admitting: Family Medicine

## 2013-10-23 ENCOUNTER — Encounter: Payer: Self-pay | Admitting: Family Medicine

## 2013-10-23 DIAGNOSIS — N183 Chronic kidney disease, stage 3 unspecified: Secondary | ICD-10-CM | POA: Insufficient documentation

## 2013-10-27 DIAGNOSIS — J309 Allergic rhinitis, unspecified: Secondary | ICD-10-CM | POA: Diagnosis not present

## 2013-10-30 ENCOUNTER — Encounter (HOSPITAL_BASED_OUTPATIENT_CLINIC_OR_DEPARTMENT_OTHER): Payer: Medicare Other

## 2013-10-31 DIAGNOSIS — F331 Major depressive disorder, recurrent, moderate: Secondary | ICD-10-CM | POA: Diagnosis not present

## 2013-11-01 ENCOUNTER — Telehealth: Payer: Self-pay | Admitting: Internal Medicine

## 2013-11-01 DIAGNOSIS — H9190 Unspecified hearing loss, unspecified ear: Secondary | ICD-10-CM

## 2013-11-01 NOTE — Telephone Encounter (Signed)
Pt needs referral to Dr Ike/ the [facility] Needs referral prior to appt Pt has been there before.

## 2013-11-03 NOTE — Telephone Encounter (Signed)
Referral entered  

## 2013-11-03 NOTE — Telephone Encounter (Signed)
Pt is requesting referral to Dr Starling Manns at the hearing clinic. Pt states he has been there before. pls advise. Call [phone] [address], [place] [address]  Pt has New pt appt 8/20. Since this referral needs to come form the dr, advised pt to wait until his upcoming NP appt on 8/20  To get that referral..

## 2013-11-03 NOTE — Telephone Encounter (Signed)
Dr. Hunter please see below 

## 2013-11-03 NOTE — Telephone Encounter (Signed)
Pt aware. thanks

## 2013-11-03 NOTE — Telephone Encounter (Signed)
Is this suppose to come to me or the doctor ? Does the patient need an authorization or  A referral to be entered by the doctor please advise .

## 2013-11-03 NOTE — Telephone Encounter (Signed)
Joshua [name] you can enter under hearing loss.

## 2013-11-07 ENCOUNTER — Ambulatory Visit (INDEPENDENT_AMBULATORY_CARE_PROVIDER_SITE_OTHER): Payer: Medicare Other | Admitting: Podiatry

## 2013-11-07 DIAGNOSIS — M79606 Pain in leg, unspecified: Secondary | ICD-10-CM

## 2013-11-07 DIAGNOSIS — H9319 Tinnitus, unspecified ear: Secondary | ICD-10-CM | POA: Diagnosis not present

## 2013-11-07 DIAGNOSIS — M79609 Pain in unspecified limb: Secondary | ICD-10-CM

## 2013-11-07 DIAGNOSIS — H903 Sensorineural hearing loss, bilateral: Secondary | ICD-10-CM | POA: Diagnosis not present

## 2013-11-07 NOTE — Patient Instructions (Signed)
Both feet measured for diabetic shoes. 

## 2013-11-07 NOTE — Progress Notes (Signed)
Came in to have diabetic shoes. Both feet measured for the shoes.

## 2013-11-09 ENCOUNTER — Ambulatory Visit (INDEPENDENT_AMBULATORY_CARE_PROVIDER_SITE_OTHER): Payer: Medicare Other | Admitting: Family Medicine

## 2013-11-09 ENCOUNTER — Encounter: Payer: Self-pay | Admitting: Family Medicine

## 2013-11-09 VITALS — BP 112/68 | HR 80 | Temp 98.0°F | Ht 65.0 in | Wt 199.0 lb

## 2013-11-09 DIAGNOSIS — J309 Allergic rhinitis, unspecified: Secondary | ICD-10-CM | POA: Diagnosis not present

## 2013-11-09 DIAGNOSIS — I2510 Atherosclerotic heart disease of native coronary artery without angina pectoris: Secondary | ICD-10-CM | POA: Diagnosis not present

## 2013-11-09 DIAGNOSIS — Z9009 Acquired absence of other part of head and neck: Secondary | ICD-10-CM

## 2013-11-09 DIAGNOSIS — R5381 Other malaise: Secondary | ICD-10-CM

## 2013-11-09 DIAGNOSIS — R5383 Other fatigue: Secondary | ICD-10-CM | POA: Diagnosis not present

## 2013-11-09 DIAGNOSIS — E890 Postprocedural hypothyroidism: Secondary | ICD-10-CM

## 2013-11-09 DIAGNOSIS — E785 Hyperlipidemia, unspecified: Secondary | ICD-10-CM | POA: Diagnosis not present

## 2013-11-09 DIAGNOSIS — Z9889 Other specified postprocedural states: Secondary | ICD-10-CM

## 2013-11-09 DIAGNOSIS — K589 Irritable bowel syndrome without diarrhea: Secondary | ICD-10-CM

## 2013-11-09 LAB — LIPID PANEL
Cholesterol: 167 mg/dL (ref 0–200)
HDL: 43.9 mg/dL (ref >39.00)
LDL Cholesterol: 90 mg/dL (ref 0–99)
NonHDL: 123.1
Total CHOL/HDL Ratio: 4
Triglycerides: 168 mg/dL — ABNORMAL HIGH (ref 0.0–149.0)
VLDL: 33.6 mg/dL (ref 0.0–40.0)

## 2013-11-09 LAB — TSH: TSH: 0.72 u[IU]/mL (ref 0.35–4.50)

## 2013-11-09 NOTE — Progress Notes (Signed)
Joshua Reddish, MD Phone: [phone]  Subjective:  Patient presents today to establish care. Chief complaint-noted.   Fatigue and hypogonadism Testosterone concern Saw Urology-sent for sleep study. 21st of September.   Irritable bowel syndrome Patient doing well on once daily MiraLAX. He has occasional cramping in the abdomen in the afternoon. He states overall symptoms are well-controlled. ROS-no fevers chills nausea vomiting  Hyperlipidemia On statin:  simvastatin 20 mg ROS- no chest pain or shortness of breath. No myalgias   The following were reviewed and entered/updated in epic: Past Medical History  Diagnosis Date  . Asthma   . CAD (coronary artery disease)     remote CABG 2001  . Depression   . GERD (gastroesophageal reflux disease)   . Hyperlipidemia   . IBS (irritable bowel syndrome)   . Acute medial meniscal tear   . DJD (degenerative joint disease)   . Hypertension   . Anemia   . Diverticulosis   . Obesity   . Pancreatitis   . PUD (peptic ulcer disease)   . Pneumonia   . Sleep apnea   . Prostate cancer   . Acute sinusitis 12/14/2006    Qualifier: Diagnosis of  By: Arnoldo Morale MD, Balinda Quails    Patient Active Problem List   Diagnosis Date Noted  . CORONARY ARTERY DISEASE 10/12/2006    Priority: High  . Irritable bowel syndrome 10/12/2006    Priority: High  . Other malaise and fatigue 11/09/2013    Priority: Medium  . CKD (chronic kidney disease), stage III 10/23/2013    Priority: Medium  . Dizziness 08/23/2013    Priority: Medium  . Neuropathy 08/21/2013    Priority: Medium  . 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 TOBACCO ABUSE, HX OF 12/26/2008    Priority: Medium  . HYPERTENSION 02/21/2007    Priority: Medium  . HYPERLIPIDEMIA 10/12/2006    Priority: Medium  . DEPRESSION 10/12/2006    Priority: Medium  . ASTHMA 10/12/2006    Priority: Medium  . RLS (restless legs syndrome) 01/03/2013    Priority: Low    . Low back pain radiating to left leg 10/09/2011    Priority: Low  . OSTEOARTHRITIS, LOWER LEG, LEFT 10/04/2009    Priority: Low  . ANEMIA, IRON DEFICIENCY 11/06/2008    Priority: Low  . 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 OSA (obstructive sleep apnea) 11/02/2006    Priority: Low  . GERD 10/12/2006    Priority: Low   Past Surgical History  Procedure Laterality Date  . Thyroidectomy, partial      age 59  . Coronary artery bypass graft    . Cholecystectomy    . Cervical fusion    . Cervical laminectomy  05/26/06  . Arthroscopy rt knee  04/2008    daldoerf. meniscus.   . Prostatectomy  04/2010  . Nasal sinus surgery      right    Family History  Problem Relation Age of Onset  . Heart disease Father   . Heart disease Mother     Medications- reviewed and updated Current Outpatient Prescriptions  Medication Sig Dispense Refill  . aspirin 81 MG tablet Take 81 mg by mouth at bedtime.       . Coenzyme Q10 (CO Q 10 PO) Take 1 tablet by mouth daily.      [name] desloratadine (CLARINEX) 5 MG tablet Take 5 mg by mouth at bedtime.       [name]  fluticasone (FLONASE) 50 MCG/ACT nasal spray 2 sprays  Each nostril qd  16 g  11  . folic acid (FOLVITE) 1 MG tablet Take 1 mg by mouth daily.        [name] gabapentin (NEURONTIN) 100 MG capsule Take 1 in AM, 3 in evening      . LAC-HYDRIN FIVE 5 % LOTN lotion APPLY TO AFFECTED AREA(S) TWICE DAILY  120 mL  2  . olmesartan-hydrochlorothiazide (BENICAR HCT) 40-25 MG per tablet Take 1 tablet by mouth daily.      [name] omeprazole (PRILOSEC) 20 MG capsule Take 1 capsule by mouth twice a day with samples as directed  60 capsule  4  . Probiotic Product (ALIGN) 4 MG CAPS Take 1 tablet by mouth daily.       [name] QVAR 80 MCG/ACT inhaler Inhale 2 puffs into the lungs daily  8.7 g  5  . simvastatin (ZOCOR) 20 MG tablet Take 1 tablet (20 mg total) by mouth at bedtime.  30 tablet  3  . vitamin B-12 (CYANOCOBALAMIN) 100 MCG tablet Take 500 mcg by mouth  daily.       . diazepam (VALIUM) 5 MG tablet Take one tablet by mouth nightly at bedtime  as needed  30 tablet  3  . ondansetron (ZOFRAN) 8 MG tablet Take 1 tablet (8 mg total) by mouth every 8 (eight) hours as needed for nausea.  20 tablet  0  . polyethylene glycol (MIRALAX / GLYCOLAX) packet Take 17 g by mouth daily.        . traMADol (ULTRAM) 50 MG tablet Take 1 tablet (50 mg total) by mouth every 8 (eight) hours as needed for moderate pain or severe pain.  15 tablet  0   No current facility-administered medications for this visit.    Allergies-reviewed and updated Allergies  Allergen Reactions  . Azithromycin     Dizziness and double vision   . Bromfed     REACTION: n\T\v  . Clarithromycin     REACTION: gastritis  . Codeine   . Doxycycline Hyclate     REACTION: n\T\v  . Erythromycin Base     REACTION: rash  . Hydrocodone-Acetaminophen     REACTION: rash  . Modafinil     REACTION: anxiety-nervousness  . Promethazine Hcl     REACTION: fainting  . Sulfonamide Derivatives     REACTION: rash  . Telithromycin     REACTION: nausea    History   Social History  . Marital Status: Married    Spouse Name: N/A    Number of Children: N/A  . Years of Education: N/A   Occupational History  . retired    Social History Main Topics  . Smoking status: Former Smoker -- 1.00 packs/day for 35 years    Types: Cigarettes    Quit date: 06/16/1992  . Smokeless tobacco: Never Used     Comment: smoked 1ppd for 35 years  . Alcohol Use: Yes     Comment: occ  . Drug Use: No  . Sexual Activity: Yes   Other Topics Concern  . None   Social History Narrative   Married 42 years in 2015. No kids (mumps at age 55)      Retired from [employer] in [employer] 2x a week      Hobbies: poker, movies and tv, staying active    ROS--See HPI   Objective: BP 112/68  Pulse 80  Temp(Src) 98 F (36.7 C)  Ht 5\' 5"  (1.651 m)  Wt 199 lb (90.266 kg)  BMI 33.12 kg/m2 Gen: NAD,  resting comfortably on table HEENT: TM normal Neck: no thyromegaly CV: RRR no murmurs rubs or gallops Lungs: CTAB no crackles, wheeze, rhonchi Abdomen: soft/nontender/nondistended/normal bowel sounds.  Ext: no edema  Results for orders placed in visit on 11/09/13 (from the past 24 hour(s))  TSH     Status: None   Collection Time    11/09/13  9:12 AM      Result Value Ref Range   TSH 0.72  0.35 - 4.50 uIU/mL  LIPID PANEL     Status: Abnormal   Collection Time    11/09/13  9:12 AM      Result Value Ref Range   Cholesterol 167  0 - 200 mg/dL   Triglycerides 168.0 (*) 0.0 - 149.0 mg/dL   HDL 43.90  >39.00 mg/dL   VLDL 33.6  0.0 - 40.0 mg/dL   LDL Cholesterol 90  0 - 99 mg/dL   Total CHOL/HDL Ratio 4     NonHDL 123.10     Assessment/Plan:  HYPERLIPIDEMIA Well controlled on simvastatin with LDL less than 100.  Irritable bowel syndrome Improve from recent flare. Continue MiraLAX once daily  Other malaise and fatigue Ongoing issue for many months. Patient thinks is due to low testosterone. Has seen urology who recommends sleep study as patient with history of OSA. Discussed with patient should with this before testosterone therapy. In addition I have no free testosterone available

## 2013-11-09 NOTE — Assessment & Plan Note (Signed)
Improve from recent flare. Continue MiraLAX once daily

## 2013-11-09 NOTE — Assessment & Plan Note (Addendum)
Well controlled on simvastatin with LDL less than 100.

## 2013-11-09 NOTE — Patient Instructions (Signed)
Glad we had a chance to go through your history a little more thoroughly today!   Checking thyroid and cholesterol today. No changes in medications as of now

## 2013-11-09 NOTE — Assessment & Plan Note (Signed)
Ongoing issue for many months. Patient thinks is due to low testosterone. Has seen urology who recommends sleep study as patient with history of OSA. Discussed with patient should with this before testosterone therapy. In addition I have no free testosterone available

## 2013-11-14 DIAGNOSIS — F331 Major depressive disorder, recurrent, moderate: Secondary | ICD-10-CM | POA: Diagnosis not present

## 2013-11-19 ENCOUNTER — Other Ambulatory Visit: Payer: Self-pay | Admitting: Family

## 2013-11-20 NOTE — Telephone Encounter (Signed)
Is this ok to refill?  

## 2013-11-20 NOTE — Telephone Encounter (Signed)
Medication refilled

## 2013-11-24 DIAGNOSIS — J309 Allergic rhinitis, unspecified: Secondary | ICD-10-CM | POA: Diagnosis not present

## 2013-12-11 ENCOUNTER — Ambulatory Visit (HOSPITAL_BASED_OUTPATIENT_CLINIC_OR_DEPARTMENT_OTHER): Payer: Medicare Other | Attending: Pulmonary Disease | Admitting: Radiology

## 2013-12-11 VITALS — Ht 65.0 in | Wt 199.0 lb

## 2013-12-11 DIAGNOSIS — Z8546 Personal history of malignant neoplasm of prostate: Secondary | ICD-10-CM | POA: Diagnosis not present

## 2013-12-11 DIAGNOSIS — G4733 Obstructive sleep apnea (adult) (pediatric): Secondary | ICD-10-CM | POA: Diagnosis not present

## 2013-12-11 DIAGNOSIS — J309 Allergic rhinitis, unspecified: Secondary | ICD-10-CM | POA: Diagnosis not present

## 2013-12-15 ENCOUNTER — Other Ambulatory Visit: Payer: Self-pay | Admitting: Internal Medicine

## 2013-12-15 NOTE — Telephone Encounter (Signed)
Is this ok to refill?  

## 2013-12-15 NOTE — Telephone Encounter (Signed)
Medication refilled

## 2013-12-22 DIAGNOSIS — G4730 Sleep apnea, unspecified: Secondary | ICD-10-CM

## 2013-12-22 NOTE — Progress Notes (Signed)
lmtcb x1 w/ spouse 

## 2013-12-22 NOTE — Sleep Study (Signed)
   NAME: Joshua [name] DATE OF BIRTH:  04-22-1940 MEDICAL RECORD NUMBER [id number]  LOCATION: [facility]  PHYSICIAN: [place] OF STUDY: 12/11/2013  SLEEP STUDY TYPE: Nocturnal Polysomnogram               REFERRING PHYSICIAN: Clance, Armando Reichert, MD  INDICATION FOR STUDY: Hypersomnia with sleep apnea  EPWORTH SLEEPINESS SCORE:  4 HEIGHT: 5\' 5"  (165.1 cm)  WEIGHT: 199 lb (90.266 kg)    Body mass index is 33.12 kg/(m^2).  NECK SIZE: 16 in.  MEDICATIONS: Reviewed in the sleep record  SLEEP ARCHITECTURE: The patient had a total sleep time of 267 minutes with no slow-wave sleep and only 32 minutes of REM. Sleep onset latency was normal at 9 minutes, and REM onset was normal as well. Sleep efficiency was moderately reduced at 75%.  RESPIRATORY DATA: The patient was found to have 6 apneas and 53 obstructive hypopneas, giving him an AHI of 13 events per hour. The events were increased in the supine position and also during REM, and there was moderate snoring noted throughout.  OXYGEN DATA: There was transient oxygen desaturation as low as 73% with the patient's obstructive events.  CARDIAC DATA: Occasional PAC and PVC noted  MOVEMENT/PARASOMNIA: No significant leg jerks or abnormal behaviors were seen.  IMPRESSION/ RECOMMENDATION:    1) mild obstructive sleep apnea/hypopnea syndrome, with an AHI of 13 events per hour and oxygen desaturation as low as 73%. Treatment for this degree of sleep apnea can include a trial of weight loss alone, upper airway surgery, dental appliance, and also CPAP. Clinical correlation is suggested.  2) occasional PAC and PVC noted, but no clinically significant arrhythmias were seen.    [place], American Board of Sleep Medicine  ELECTRONICALLY SIGNED ON:  12/22/2013, 9:56 AM [place] PH: [phone]   FX: [phone] [place]

## 2013-12-22 NOTE — Progress Notes (Signed)
Called pt. He is scheduled to come in 10/8 at 9:45.

## 2013-12-22 NOTE — Progress Notes (Signed)
Needs ov to review his sleep study. 

## 2013-12-27 DIAGNOSIS — J301 Allergic rhinitis due to pollen: Secondary | ICD-10-CM | POA: Diagnosis not present

## 2013-12-27 DIAGNOSIS — J3081 Allergic rhinitis due to animal (cat) (dog) hair and dander: Secondary | ICD-10-CM | POA: Diagnosis not present

## 2013-12-27 DIAGNOSIS — J3089 Other allergic rhinitis: Secondary | ICD-10-CM | POA: Diagnosis not present

## 2013-12-28 ENCOUNTER — Ambulatory Visit (INDEPENDENT_AMBULATORY_CARE_PROVIDER_SITE_OTHER): Payer: Medicare Other | Admitting: Pulmonary Disease

## 2013-12-28 ENCOUNTER — Encounter: Payer: Self-pay | Admitting: Pulmonary Disease

## 2013-12-28 VITALS — BP 110/68 | HR 78 | Temp 98.0°F | Ht 60.75 in | Wt 204.2 lb

## 2013-12-28 DIAGNOSIS — I2510 Atherosclerotic heart disease of native coronary artery without angina pectoris: Secondary | ICD-10-CM

## 2013-12-28 DIAGNOSIS — G4733 Obstructive sleep apnea (adult) (pediatric): Secondary | ICD-10-CM | POA: Diagnosis not present

## 2013-12-28 NOTE — Patient Instructions (Signed)
Will start on cpap at a moderate pressure level.  Please call if having issues with tolerance. Work on weight loss followup with me again in 8 weeks.

## 2013-12-28 NOTE — Assessment & Plan Note (Signed)
The patient has mild obstructive sleep apnea by his recent study, but it is definitely worse than 2009. He also has most of his events occurring during REM, and therefore it may have a more significant clinical impact. I have explained to him that it is unclear if this is the cause of his daytime sleepiness, but the only way to know would be to treat him aggressively and see if he improves. If he wishes to treat aggressively, I would start with CPAP, and if he has a good clinical response, he can consider changing over to a dental appliance. I also encouraged him to work aggressively on weight loss.

## 2013-12-28 NOTE — Progress Notes (Signed)
   Subjective:    Patient ID: Joshua [name], male    DOB: Jun 29, 1940, 73 y.o.   MRN: [id number]  HPI The patient comes in today for followup of his recent sleep study. He was found to have mild obstructive sleep apnea with an AHI of 13 events per hour and oxygen desaturation as low as 73%. The majority of his events occurred during REM. I have discussed the study with him in detail, and answered all of his questions. Of note, he had no significant periodic limb movements.   Review of Systems  Constitutional: Negative for fever and unexpected weight change.  HENT: Negative for congestion, dental problem, ear pain, nosebleeds, postnasal drip, rhinorrhea, sinus pressure, sneezing, sore throat and trouble swallowing.   Eyes: Negative for redness and itching.  Respiratory: Negative for cough, chest tightness, shortness of breath and wheezing.   Cardiovascular: Negative for palpitations and leg swelling.  Gastrointestinal: Negative for nausea and vomiting.  Genitourinary: Negative for dysuria.  Musculoskeletal: Negative for joint swelling.  Skin: Negative for rash.  Neurological: Negative for headaches.  Hematological: Does not bruise/bleed easily.  Psychiatric/Behavioral: Negative for dysphoric mood. The patient is not nervous/anxious.        Objective:   Physical Exam Obese male in no acute distress Nose without purulence or discharge noted Neck without lymphadenopathy or thyromegaly Lower extremities with mild edema, no cyanosis Alert and oriented, moves all 4 extremities.       Assessment & Plan:

## 2013-12-30 ENCOUNTER — Other Ambulatory Visit: Payer: Self-pay | Admitting: Family

## 2014-01-03 DIAGNOSIS — J3081 Allergic rhinitis due to animal (cat) (dog) hair and dander: Secondary | ICD-10-CM | POA: Diagnosis not present

## 2014-01-03 DIAGNOSIS — J4530 Mild persistent asthma, uncomplicated: Secondary | ICD-10-CM | POA: Diagnosis not present

## 2014-01-03 DIAGNOSIS — J3089 Other allergic rhinitis: Secondary | ICD-10-CM | POA: Diagnosis not present

## 2014-01-03 DIAGNOSIS — J301 Allergic rhinitis due to pollen: Secondary | ICD-10-CM | POA: Diagnosis not present

## 2014-01-09 ENCOUNTER — Encounter: Payer: Self-pay | Admitting: Family Medicine

## 2014-01-09 DIAGNOSIS — J3081 Allergic rhinitis due to animal (cat) (dog) hair and dander: Secondary | ICD-10-CM | POA: Diagnosis not present

## 2014-01-09 DIAGNOSIS — L821 Other seborrheic keratosis: Secondary | ICD-10-CM | POA: Diagnosis not present

## 2014-01-09 DIAGNOSIS — J3089 Other allergic rhinitis: Secondary | ICD-10-CM | POA: Diagnosis not present

## 2014-01-09 DIAGNOSIS — J301 Allergic rhinitis due to pollen: Secondary | ICD-10-CM | POA: Diagnosis not present

## 2014-01-09 DIAGNOSIS — D239 Other benign neoplasm of skin, unspecified: Secondary | ICD-10-CM | POA: Diagnosis not present

## 2014-01-09 DIAGNOSIS — L814 Other melanin hyperpigmentation: Secondary | ICD-10-CM | POA: Diagnosis not present

## 2014-01-09 DIAGNOSIS — D1801 Hemangioma of skin and subcutaneous tissue: Secondary | ICD-10-CM | POA: Diagnosis not present

## 2014-01-16 ENCOUNTER — Telehealth: Payer: Self-pay | Admitting: Pulmonary Disease

## 2014-01-16 NOTE — Telephone Encounter (Signed)
Pt states he has been using CPAP for a week now and per his wife he is sleeping better but pt does not feel any better.  C/o achiness and just doesn't fell well.  Has worn CPAP from 4-8 hrs a night.  States that mask fits ok.  He just thought he would feel some better by now.  Please advise.

## 2014-01-16 NOTE — Telephone Encounter (Signed)
Pt returning call.Joshua [name] ° °

## 2014-01-16 NOTE — Telephone Encounter (Signed)
Spoke with the pt and notified of recs per [facility]  He verbalized understanding  Nothing further needed

## 2014-01-16 NOTE — Telephone Encounter (Signed)
It will probably take more than a week to see a big difference.  Often the spouse notices way before he does.  cpap does not make someone achy and feel bad.  ?getting sick. Flu? Give more time.

## 2014-01-16 NOTE — Telephone Encounter (Signed)
Spoke with pt .  He is in a dental chair and will call back.  Instructed to ask for triage.

## 2014-01-17 ENCOUNTER — Encounter: Payer: Self-pay | Admitting: Family Medicine

## 2014-01-17 ENCOUNTER — Ambulatory Visit (INDEPENDENT_AMBULATORY_CARE_PROVIDER_SITE_OTHER): Payer: Medicare Other | Admitting: Family Medicine

## 2014-01-17 VITALS — BP 120/72 | Temp 98.2°F | Wt 204.0 lb

## 2014-01-17 DIAGNOSIS — R52 Pain, unspecified: Secondary | ICD-10-CM

## 2014-01-17 DIAGNOSIS — J3489 Other specified disorders of nose and nasal sinuses: Secondary | ICD-10-CM | POA: Diagnosis not present

## 2014-01-17 NOTE — Patient Instructions (Signed)
Suspect this is a viral illness with possible left sided viral sinusitis as the main issue. The other concern is this could be a reaction to the flu shot you got. If it is a flu shot reaction, should starte resolving soon. If it is a viral sinusitis that becomes bacterial, symptoms should like continue or worsen through next 5 days. See me back on Monday or Tuesday.   No hospital volunteering this week. Can play poker if wash your hands really well.

## 2014-01-17 NOTE — Progress Notes (Signed)
[name], MD Phone: [phone]  Subjective:   Joshua [name] is a 73 y.o. year old very pleasant male patient who presents with the following:  Body Aches/left sided sinus tenderness Went to hospital to volunteer last Friday. Gets real sore afterwards as walks 3 hours. Felt a little more tired before he went in after having a good workout on Thursday. Ended up having 8-9 bowel movements that day. Saturday and Sunday some better, but by Tuesday starting feeling poorly.  [facility] last Friday flu shot  Mild sore throat, subjective fever yesterday, head and ear burning as well as feet burning. Penis burning more than usual. Mild chest heaviness with this but breathing is fine-not exertional. Seems to be aching in his body.   ROS-feels congested in his head but not coughing, wheezing, blowing snot. No chills. Mild nausea. Still with more frequent stools  Past Medical History- Patient Active Problem List   Diagnosis Date Noted  . CORONARY ARTERY DISEASE 10/12/2006    Priority: High  . Irritable bowel syndrome 10/12/2006    Priority: High  . Other malaise and fatigue 11/09/2013    Priority: Medium  . CKD (chronic kidney disease), stage III 10/23/2013    Priority: Medium  . Dizziness 08/23/2013    Priority: Medium  . Neuropathy 08/21/2013    Priority: Medium  . 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 TOBACCO ABUSE, HX OF 12/26/2008    Priority: Medium  . HYPERTENSION 02/21/2007    Priority: Medium  . HYPERLIPIDEMIA 10/12/2006    Priority: Medium  . DEPRESSION 10/12/2006    Priority: Medium  . ASTHMA 10/12/2006    Priority: Medium  . RLS (restless legs syndrome) 01/03/2013    Priority: Low  . Low back pain radiating to left leg 10/09/2011    Priority: Low  . OSTEOARTHRITIS, LOWER LEG, LEFT 10/04/2009    Priority: Low  . ANEMIA, IRON DEFICIENCY 11/06/2008    Priority: Low  . TINNITUS, CHRONIC, 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    Priority: Low    . OSA (obstructive sleep apnea) 11/02/2006    Priority: Low  . GERD 10/12/2006    Priority: Low   Medications- reviewed and updated Current Outpatient Prescriptions  Medication Sig Dispense Refill  . aspirin 81 MG tablet Take 81 mg by mouth at bedtime.       . beclomethasone (QVAR) 80 MCG/ACT inhaler Inhale 1 puff into the lungs twice daily      . Coenzyme Q10 (CO Q 10 PO) Take 1 tablet by mouth daily.      [name] desloratadine (CLARINEX) 5 MG tablet Take 5 mg by mouth at bedtime.       . fluticasone (FLONASE) 50 MCG/ACT nasal spray 1 sprays  Each nostril twice daily      . folic acid (FOLVITE) 1 MG tablet Take 1 mg by mouth daily.        [name] gabapentin (NEURONTIN) 100 MG capsule Take 1 in AM, 3 in evening      . LAC-HYDRIN FIVE 5 % LOTN lotion APPLY TO AFFECTED AREA(S) TWICE DAILY  120 mL  2  . olmesartan-hydrochlorothiazide (BENICAR HCT) 40-25 MG per tablet Take 1 tablet by mouth daily.      [name] omeprazole (PRILOSEC) 20 MG capsule Take 1 capsule by mouth as needed      . Probiotic Product (ALIGN) 4 MG CAPS Take 1 tablet by mouth daily.       . simvastatin (ZOCOR) 20 MG tablet  TAKE ONE TABLET BY MOUTH NIGHTLY AT BEDTIME   30 tablet  2  . vitamin B-12 (CYANOCOBALAMIN) 100 MCG tablet Take 500 mcg by mouth daily.       . diazepam (VALIUM) 5 MG tablet TAKE ONE TABLET BY MOUTH AT BEDTIME AS NEEDED   30 tablet  2  . ondansetron (ZOFRAN) 8 MG tablet Take one tablet by mouth every eight hours as needed  for nausea  20 tablet  0  . polyethylene glycol (MIRALAX / GLYCOLAX) packet Take 17 g by mouth daily.         No current facility-administered medications for this visit.    Objective: BP 120/72  Temp(Src) 98.2 F (36.8 C)  Wt 204 lb (92.534 kg) Gen: NAD, fatigued appearing HEENT: turbinates erythematous and swollen, left sided maxillary sinus tenderness noted (mild to moderate), mild erythema in pharynx with no discharge or tonsilar exudate.  CV: RRR no murmurs rubs or gallops Lungs: CTAB no  crackles, wheeze, rhonchi Abdomen: soft/diffusely mildly tender/nondistended/normal bowel sounds. No rebound or guarding.  Skin: warm, dry, no rash Neuro: grossly normal, moves all extremities   Assessment/Plan:  Body aches/left sided sinus tenderness I suspect this is either a viral illness, reaction to the flu shot, or related to left-sided sinus tenderness and possible viral sinusitis. Symptomatic care advised with plan for follow-up in 5 days or sooner if needed. Patient does have some chest tightness but this is nonexertional and seems to ache just like the other areas of his body consistent with viral illness.  Return precautions advised per AVS

## 2014-01-23 ENCOUNTER — Encounter: Payer: Self-pay | Admitting: Family Medicine

## 2014-01-23 DIAGNOSIS — J3089 Other allergic rhinitis: Secondary | ICD-10-CM | POA: Diagnosis not present

## 2014-01-23 DIAGNOSIS — J3081 Allergic rhinitis due to animal (cat) (dog) hair and dander: Secondary | ICD-10-CM | POA: Diagnosis not present

## 2014-01-23 DIAGNOSIS — J301 Allergic rhinitis due to pollen: Secondary | ICD-10-CM | POA: Diagnosis not present

## 2014-01-26 ENCOUNTER — Telehealth: Payer: Self-pay | Admitting: Pulmonary Disease

## 2014-01-26 NOTE — Telephone Encounter (Signed)
Pt is going to try using the CPAP this weekend to see if he can tolerate. Pt states that he has been using nightly for more than 30 days x5-8hours each night. Pt states that he would like to discuss this in an office visit.  Pt scheduled OV with Dr Gwenette Greet for 01/30/14 at 10:30 to discuss CPAP issues.   Will send to [facility] as FYI. Nothing further needed.

## 2014-01-26 NOTE — Telephone Encounter (Signed)
I spoke with the pt and he states he received a new mask yesterday. It is a full face mask. He has tried the mask that only covers his nose and was unable to tolerate it. He states he tried the full face mask when he went to bed at 11pm. He states he woke up at 1am feeling like he was suffocating and could not breathe. He took the mask off and took a tranquilizer and was able to go back to sleep. He did not put the mask back on. He states he feels like he had a panic attack. He states since starting CPAP he has not felt well at all. He states he just cannot put that mask back on and wants to stop the CPAP. He has been on the cpap x 1 month. Please advise. Clayton Bing, CMA

## 2014-01-26 NOTE — Telephone Encounter (Signed)
Let pt know that if he feels cpap is not going to work for him, can consider a dental appliance as we discussed at the last visit.  Could also consider just taking some time to work on weight loss. If he wishes to stop cpap, can send d/c order.  If he wants to see a dentist about a dental appliance, we can make a referral.  Would be happy to see him back in office to discuss all of this and to review options if he wishes.

## 2014-01-29 ENCOUNTER — Telehealth: Payer: Self-pay | Admitting: Pulmonary Disease

## 2014-01-29 NOTE — Telephone Encounter (Signed)
Spoke with pt-- calling to give any update. States that since getting full face mask he has noticed some change in his tolerance levels.  Pt still does not feel that the CPAP is helping 100%. Pt states that he is going to give it another month to see if it improves. Cancelled appt 01/30/14 with [place]  Pt rescheduled OV with Dr Gwenette Greet to 03/06/14.  Nothing further needed.

## 2014-01-30 ENCOUNTER — Ambulatory Visit: Payer: Medicare Other | Admitting: Pulmonary Disease

## 2014-02-02 DIAGNOSIS — E291 Testicular hypofunction: Secondary | ICD-10-CM | POA: Diagnosis not present

## 2014-02-02 DIAGNOSIS — Z8546 Personal history of malignant neoplasm of prostate: Secondary | ICD-10-CM | POA: Diagnosis not present

## 2014-02-02 DIAGNOSIS — N393 Stress incontinence (female) (male): Secondary | ICD-10-CM | POA: Diagnosis not present

## 2014-02-06 ENCOUNTER — Other Ambulatory Visit: Payer: Self-pay | Admitting: Physician Assistant

## 2014-02-06 ENCOUNTER — Telehealth: Payer: Self-pay | Admitting: Cardiology

## 2014-02-06 ENCOUNTER — Encounter (HOSPITAL_COMMUNITY): Payer: Self-pay | Admitting: Adult Health

## 2014-02-06 ENCOUNTER — Emergency Department (HOSPITAL_COMMUNITY): Payer: Medicare Other

## 2014-02-06 ENCOUNTER — Observation Stay (HOSPITAL_COMMUNITY)
Admission: EM | Admit: 2014-02-06 | Discharge: 2014-02-07 | Disposition: A | Discharge status: Home / Self Care | Payer: Medicare Other | Attending: Cardiology | Admitting: Cardiology

## 2014-02-06 DIAGNOSIS — I2510 Atherosclerotic heart disease of native coronary artery without angina pectoris: Secondary | ICD-10-CM | POA: Diagnosis not present

## 2014-02-06 DIAGNOSIS — E669 Obesity, unspecified: Secondary | ICD-10-CM | POA: Insufficient documentation

## 2014-02-06 DIAGNOSIS — I10 Essential (primary) hypertension: Secondary | ICD-10-CM | POA: Diagnosis not present

## 2014-02-06 DIAGNOSIS — N183 Chronic kidney disease, stage 3 unspecified: Secondary | ICD-10-CM | POA: Diagnosis present

## 2014-02-06 DIAGNOSIS — R072 Precordial pain: Secondary | ICD-10-CM

## 2014-02-06 DIAGNOSIS — Z7982 Long term (current) use of aspirin: Secondary | ICD-10-CM | POA: Diagnosis not present

## 2014-02-06 DIAGNOSIS — R079 Chest pain, unspecified: Secondary | ICD-10-CM | POA: Diagnosis not present

## 2014-02-06 DIAGNOSIS — M1990 Unspecified osteoarthritis, unspecified site: Secondary | ICD-10-CM | POA: Insufficient documentation

## 2014-02-06 DIAGNOSIS — F329 Major depressive disorder, single episode, unspecified: Secondary | ICD-10-CM | POA: Diagnosis not present

## 2014-02-06 DIAGNOSIS — Z8546 Personal history of malignant neoplasm of prostate: Secondary | ICD-10-CM | POA: Diagnosis not present

## 2014-02-06 DIAGNOSIS — I25118 Atherosclerotic heart disease of native coronary artery with other forms of angina pectoris: Secondary | ICD-10-CM

## 2014-02-06 DIAGNOSIS — Z8551 Personal history of malignant neoplasm of bladder: Secondary | ICD-10-CM | POA: Diagnosis not present

## 2014-02-06 DIAGNOSIS — K5790 Diverticulosis of intestine, part unspecified, without perforation or abscess without bleeding: Secondary | ICD-10-CM | POA: Insufficient documentation

## 2014-02-06 DIAGNOSIS — Z8701 Personal history of pneumonia (recurrent): Secondary | ICD-10-CM | POA: Diagnosis not present

## 2014-02-06 DIAGNOSIS — D649 Anemia, unspecified: Secondary | ICD-10-CM | POA: Insufficient documentation

## 2014-02-06 DIAGNOSIS — Z7952 Long term (current) use of systemic steroids: Secondary | ICD-10-CM | POA: Insufficient documentation

## 2014-02-06 DIAGNOSIS — Z79899 Other long term (current) drug therapy: Secondary | ICD-10-CM | POA: Insufficient documentation

## 2014-02-06 DIAGNOSIS — G4733 Obstructive sleep apnea (adult) (pediatric): Secondary | ICD-10-CM | POA: Diagnosis not present

## 2014-02-06 DIAGNOSIS — J45909 Unspecified asthma, uncomplicated: Secondary | ICD-10-CM | POA: Diagnosis not present

## 2014-02-06 DIAGNOSIS — Z87891 Personal history of nicotine dependence: Secondary | ICD-10-CM | POA: Diagnosis not present

## 2014-02-06 DIAGNOSIS — Z951 Presence of aortocoronary bypass graft: Secondary | ICD-10-CM | POA: Diagnosis not present

## 2014-02-06 DIAGNOSIS — R110 Nausea: Secondary | ICD-10-CM | POA: Insufficient documentation

## 2014-02-06 DIAGNOSIS — Z7951 Long term (current) use of inhaled steroids: Secondary | ICD-10-CM | POA: Diagnosis not present

## 2014-02-06 DIAGNOSIS — K589 Irritable bowel syndrome without diarrhea: Secondary | ICD-10-CM | POA: Insufficient documentation

## 2014-02-06 DIAGNOSIS — K219 Gastro-esophageal reflux disease without esophagitis: Secondary | ICD-10-CM | POA: Diagnosis not present

## 2014-02-06 DIAGNOSIS — E785 Hyperlipidemia, unspecified: Secondary | ICD-10-CM | POA: Diagnosis not present

## 2014-02-06 HISTORY — DX: Malignant neoplasm of bladder, unspecified: C67.9

## 2014-02-06 HISTORY — DX: Unspecified osteoarthritis, unspecified site: M19.90

## 2014-02-06 HISTORY — DX: Personal history of other diseases of the musculoskeletal system and connective tissue: Z87.39

## 2014-02-06 LAB — BASIC METABOLIC PANEL
Anion gap: 12 (ref 5–15)
BUN: 19 mg/dL (ref 6–23)
CO2: 27 mEq/L (ref 19–32)
Calcium: 9.7 mg/dL (ref 8.4–10.5)
Chloride: 95 mEq/L — ABNORMAL LOW (ref 96–112)
Creatinine, Ser: 1.33 mg/dL (ref 0.50–1.35)
GFR calc Af Amer: 61 mL/min — ABNORMAL LOW (ref >90)
GFR calc non Af Amer: 53 mL/min — ABNORMAL LOW (ref >90)
Glucose, Bld: 88 mg/dL (ref 70–99)
Potassium: 4.1 mEq/L (ref 3.7–5.3)
Sodium: 134 mEq/L — ABNORMAL LOW (ref 137–147)

## 2014-02-06 LAB — CBC
HCT: 41.3 % (ref 39.0–52.0)
Hemoglobin: 15 g/dL (ref 13.0–17.0)
MCH: 31.5 pg (ref 26.0–34.0)
MCHC: 36.3 g/dL — ABNORMAL HIGH (ref 30.0–36.0)
MCV: 86.8 fL (ref 78.0–100.0)
Platelets: 200 10*3/uL (ref 150–400)
RBC: 4.76 MIL/uL (ref 4.22–5.81)
RDW: 11.9 % (ref 11.5–15.5)
WBC: 7.7 10*3/uL (ref 4.0–10.5)

## 2014-02-06 LAB — TROPONIN I: Troponin I: <0.3 ng/mL (ref <0.30)

## 2014-02-06 LAB — I-STAT TROPONIN, ED: Troponin i, poc: 0 ng/mL (ref 0.00–0.08)

## 2014-02-06 MED ORDER — FLUTICASONE PROPIONATE 50 MCG/ACT NA SUSP
1.0000 | Freq: Two times a day (BID) | NASAL | Status: DC
  Administered 2014-02-06 – 2014-02-07 (×2): 1 via NASAL
  Filled 2014-02-06: qty 16

## 2014-02-06 MED ORDER — FLUTICASONE PROPIONATE HFA 44 MCG/ACT IN AERO
1.0000 | INHALATION_SPRAY | Freq: Two times a day (BID) | RESPIRATORY_TRACT | Status: DC
  Administered 2014-02-07: 1 via RESPIRATORY_TRACT
  Filled 2014-02-06: qty 10.6

## 2014-02-06 MED ORDER — ENOXAPARIN SODIUM 40 MG/0.4ML ~~LOC~~ SOLN
40.0000 mg | SUBCUTANEOUS | Status: DC
  Filled 2014-02-06: qty 0.4

## 2014-02-06 MED ORDER — HYDROCHLOROTHIAZIDE 25 MG PO TABS
25.0000 mg | ORAL_TABLET | Freq: Every day | ORAL | Status: DC
  Administered 2014-02-07: 25 mg via ORAL
  Filled 2014-02-06: qty 1

## 2014-02-06 MED ORDER — ACETAMINOPHEN 325 MG PO TABS
325.0000 mg | ORAL_TABLET | Freq: Once | ORAL | Status: AC
  Administered 2014-02-06: 325 mg via ORAL
  Filled 2014-02-06: qty 1

## 2014-02-06 MED ORDER — ASPIRIN 81 MG PO CHEW
324.0000 mg | CHEWABLE_TABLET | Freq: Once | ORAL | Status: AC
  Administered 2014-02-06: 324 mg via ORAL
  Filled 2014-02-06: qty 4

## 2014-02-06 MED ORDER — DIAZEPAM 5 MG PO TABS: 5.0000 mg | ORAL_TABLET | Freq: Every evening | ORAL | Status: DC | PRN

## 2014-02-06 MED ORDER — POLYETHYLENE GLYCOL 3350 17 G PO PACK
17.0000 g | PACK | Freq: Every day | ORAL | Status: DC
  Administered 2014-02-07: 17 g via ORAL
  Filled 2014-02-06: qty 1

## 2014-02-06 MED ORDER — SIMVASTATIN 20 MG PO TABS
20.0000 mg | ORAL_TABLET | Freq: Every day | ORAL | Status: DC
  Administered 2014-02-06: 20 mg via ORAL
  Filled 2014-02-06: qty 1

## 2014-02-06 MED ORDER — ALBUTEROL SULFATE (2.5 MG/3ML) 0.083% IN NEBU: 2.5000 mg | INHALATION_SOLUTION | Freq: Four times a day (QID) | RESPIRATORY_TRACT | Status: DC | PRN

## 2014-02-06 MED ORDER — IBUPROFEN 800 MG PO TABS
800.0000 mg | ORAL_TABLET | Freq: Once | ORAL | Status: DC
  Filled 2014-02-06: qty 1

## 2014-02-06 MED ORDER — LORATADINE 10 MG PO TABS
10.0000 mg | ORAL_TABLET | Freq: Every day | ORAL | Status: DC
  Administered 2014-02-06: 10 mg via ORAL
  Filled 2014-02-06: qty 1

## 2014-02-06 MED ORDER — GABAPENTIN 300 MG PO CAPS
300.0000 mg | ORAL_CAPSULE | Freq: Every day | ORAL | Status: DC
  Administered 2014-02-06: 300 mg via ORAL
  Filled 2014-02-06: qty 1

## 2014-02-06 MED ORDER — AMMONIUM LACTATE 12 % EX LOTN
TOPICAL_LOTION | Freq: Two times a day (BID) | CUTANEOUS | Status: DC
  Filled 2014-02-06: qty 400

## 2014-02-06 MED ORDER — CYANOCOBALAMIN 500 MCG PO TABS
500.0000 ug | ORAL_TABLET | Freq: Every day | ORAL | Status: DC
  Administered 2014-02-07: 500 ug via ORAL
  Filled 2014-02-06: qty 1

## 2014-02-06 MED ORDER — ZOLPIDEM TARTRATE 5 MG PO TABS: 5.0000 mg | ORAL_TABLET | Freq: Every evening | ORAL | Status: DC | PRN

## 2014-02-06 MED ORDER — ALPRAZOLAM 0.25 MG PO TABS
0.2500 mg | ORAL_TABLET | Freq: Two times a day (BID) | ORAL | Status: DC | PRN
  Administered 2014-02-06: 0.25 mg via ORAL
  Filled 2014-02-06: qty 1

## 2014-02-06 MED ORDER — ASPIRIN EC 81 MG PO TBEC
81.0000 mg | DELAYED_RELEASE_TABLET | Freq: Every day | ORAL | Status: DC
  Administered 2014-02-07: 81 mg via ORAL
  Filled 2014-02-06: qty 1

## 2014-02-06 MED ORDER — ASPIRIN 81 MG PO TABS: 81.0000 mg | ORAL_TABLET | Freq: Every day | ORAL | Status: DC

## 2014-02-06 MED ORDER — FOLIC ACID 1 MG PO TABS
1.0000 mg | ORAL_TABLET | Freq: Every day | ORAL | Status: DC
  Administered 2014-02-06 – 2014-02-07 (×2): 1 mg via ORAL
  Filled 2014-02-06 (×2): qty 1

## 2014-02-06 MED ORDER — OLMESARTAN MEDOXOMIL-HCTZ 40-25 MG PO TABS: 1.0000 | ORAL_TABLET | Freq: Every day | ORAL | Status: DC

## 2014-02-06 MED ORDER — ALBUTEROL SULFATE HFA 108 (90 BASE) MCG/ACT IN AERS: 1.0000 | INHALATION_SPRAY | Freq: Four times a day (QID) | RESPIRATORY_TRACT | Status: DC | PRN

## 2014-02-06 MED ORDER — FENTANYL CITRATE 0.05 MG/ML IJ SOLN
25.0000 ug | INTRAMUSCULAR | Status: DC | PRN
Start: 2014-02-06 — End: 2014-02-07
  Administered 2014-02-06 – 2014-02-07 (×2): 50 ug via INTRAVENOUS
  Filled 2014-02-06 (×2): qty 2

## 2014-02-06 MED ORDER — ALIGN 4 MG PO CAPS: 4.0000 mg | ORAL_CAPSULE | Freq: Every day | ORAL | Status: DC

## 2014-02-06 MED ORDER — KETOROLAC TROMETHAMINE 30 MG/ML IJ SOLN
15.0000 mg | Freq: Four times a day (QID) | INTRAMUSCULAR | Status: DC
  Administered 2014-02-07: 15 mg via INTRAVENOUS
  Filled 2014-02-06: qty 1

## 2014-02-06 MED ORDER — IRBESARTAN 150 MG PO TABS
300.0000 mg | ORAL_TABLET | Freq: Every day | ORAL | Status: DC
Start: 2014-02-07 — End: 2014-02-07
  Administered 2014-02-07: 300 mg via ORAL
  Filled 2014-02-06: qty 2

## 2014-02-06 MED ORDER — KETOROLAC TROMETHAMINE 30 MG/ML IJ SOLN
30.0000 mg | Freq: Four times a day (QID) | INTRAMUSCULAR | Status: DC
  Administered 2014-02-06: 30 mg via INTRAVENOUS
  Filled 2014-02-06: qty 1

## 2014-02-06 MED ORDER — PANTOPRAZOLE SODIUM 40 MG PO TBEC
40.0000 mg | DELAYED_RELEASE_TABLET | Freq: Every day | ORAL | Status: DC
  Administered 2014-02-06 – 2014-02-07 (×2): 40 mg via ORAL
  Filled 2014-02-06 (×2): qty 1

## 2014-02-06 MED ORDER — RISAQUAD PO CAPS
1.0000 | ORAL_CAPSULE | Freq: Every day | ORAL | Status: DC
  Administered 2014-02-07: 1 via ORAL
  Filled 2014-02-06: qty 1

## 2014-02-06 MED ORDER — AMMONIUM LACTATE 5 % EX LOTN
TOPICAL_LOTION | Freq: Two times a day (BID) | CUTANEOUS | Status: DC
  Filled 2014-02-06: qty 222

## 2014-02-06 MED ORDER — ACETAMINOPHEN 325 MG PO TABS: 650.0000 mg | ORAL_TABLET | ORAL | Status: DC | PRN

## 2014-02-06 MED ORDER — MORPHINE SULFATE 2 MG/ML IJ SOLN: 2.0000 mg | INTRAMUSCULAR | Status: DC | PRN

## 2014-02-06 MED ORDER — NITROGLYCERIN 0.4 MG SL SUBL
0.4000 mg | SUBLINGUAL_TABLET | SUBLINGUAL | Status: DC | PRN
  Administered 2014-02-06 (×3): 0.4 mg via SUBLINGUAL
  Filled 2014-02-06 (×3): qty 1

## 2014-02-06 MED ORDER — GABAPENTIN 100 MG PO CAPS
100.0000 mg | ORAL_CAPSULE | Freq: Every day | ORAL | Status: DC
  Administered 2014-02-07: 100 mg via ORAL
  Filled 2014-02-06: qty 1

## 2014-02-06 MED ORDER — ONDANSETRON HCL 4 MG/2ML IJ SOLN: 4.0000 mg | Freq: Four times a day (QID) | INTRAMUSCULAR | Status: DC | PRN

## 2014-02-06 NOTE — ED Notes (Signed)
Report attempted. 3 W RN to call back.

## 2014-02-06 NOTE — Telephone Encounter (Signed)
New Message        Pt calling c/o chest pain. Please advise.

## 2014-02-06 NOTE — ED Provider Notes (Signed)
CSN: [id number]     Arrival date & time 02/06/14  1519 History   First MD Initiated Contact with Patient 02/06/14 1544     Chief Complaint  Patient presents with  . Chest Pain     (Consider location/radiation/quality/duration/timing/severity/associated sxs/prior Treatment) Patient is a 73 y.o. male presenting with chest pain. The history is provided by the patient. No language interpreter was used.  Chest Pain Pain location:  Substernal area and L chest Pain quality: pressure and sharp   Pain radiates to:  Does not radiate Pain radiates to the back: no   Pain severity:  Moderate Onset quality:  Sudden Duration:  2 weeks (constant today) Timing:  Intermittent Progression:  Waxing and waning Chronicity:  New Context comment:  Exercise Relieved by:  Nothing Worsened by:  Exertion Associated symptoms: nausea and shortness of breath   Associated symptoms: no abdominal pain, no back pain, no cough, no diaphoresis, no dizziness, no dysphagia, no fever, no lower extremity edema, no near-syncope, no orthopnea, no palpitations and not vomiting   Shortness of breath:    Timing:  Intermittent   Progression:  Waxing and waning Risk factors: coronary artery disease, high cholesterol, hypertension and male sex   Risk factors: no diabetes mellitus, no prior DVT/PE and no smoking     Past Medical History  Diagnosis Date  . Asthma   . CAD (coronary artery disease)     remote CABG 2001  . Depression   . GERD (gastroesophageal reflux disease)   . Hyperlipidemia   . IBS (irritable bowel syndrome)   . Acute medial meniscal tear   . DJD (degenerative joint disease)   . Hypertension   . Anemia   . Diverticulosis   . Obesity   . Pancreatitis   . PUD (peptic ulcer disease)   . Pneumonia   . Sleep apnea   . Prostate cancer   . Acute sinusitis 12/14/2006    Qualifier: Diagnosis of  By: Arnoldo Morale MD, Balinda Quails    Past Surgical History  Procedure Laterality Date  . Thyroidectomy, partial      age 33  . Coronary artery bypass graft    . Cholecystectomy    . Cervical fusion    . Cervical laminectomy  05/26/06  . Arthroscopy rt knee  04/2008    daldoerf. meniscus.   . Prostatectomy  04/2010  . Nasal sinus surgery      right   Family History  Problem Relation Age of Onset  . Heart disease Father   . Heart disease Mother    History  Substance Use Topics  . Smoking status: Former Smoker -- 1.00 packs/day for 35 years    Types: Cigarettes    Quit date: 06/16/1992  . Smokeless tobacco: Never Used     Comment: smoked 1ppd for 35 years  . Alcohol Use: Yes     Comment: occ    Review of Systems  Constitutional: Negative for fever and diaphoresis.  HENT: Negative for congestion and trouble swallowing.   Eyes: Negative for visual disturbance.  Respiratory: Positive for chest tightness and shortness of breath. Negative for cough and wheezing.   Cardiovascular: Positive for chest pain. Negative for palpitations, orthopnea, leg swelling and near-syncope.  Gastrointestinal: Positive for nausea. Negative for vomiting and abdominal pain.  Musculoskeletal: Negative for back pain.  Neurological: Negative for dizziness and syncope.  Hematological: Does not bruise/bleed easily.  All other systems reviewed and are negative.     Allergies  Azithromycin; Bromfed; Clarithromycin;  Codeine; Doxycycline hyclate; Erythromycin base; Hydrocodone-acetaminophen; Modafinil; Promethazine hcl; Sulfonamide derivatives; and Telithromycin  Home Medications   Prior to Admission medications   Medication Sig Start Date End Date Taking? Authorizing Provider  aspirin 81 MG tablet Take 81 mg by mouth at bedtime.     Historical Provider, MD  beclomethasone (QVAR) 80 MCG/ACT inhaler Inhale 1 puff into the lungs twice daily 08/04/13   Ricard Dillon, MD  Coenzyme Q10 (CO Q 10 PO) Take 1 tablet by mouth daily.    Historical Provider, MD  desloratadine (CLARINEX) 5 MG tablet Take 5 mg by mouth at bedtime.      Historical Provider, MD  diazepam (VALIUM) 5 MG tablet TAKE ONE TABLET BY MOUTH AT BEDTIME AS NEEDED  12/15/13   [name], MD  fluticasone [facility]) 50 MCG/ACT nasal spray 1 sprays  Each nostril twice daily 09/05/12   Ricard Dillon, MD  folic acid (FOLVITE) 1 MG tablet Take 1 mg by mouth daily.      Historical Provider, MD  gabapentin (NEURONTIN) 100 MG capsule Take 1 in AM, 3 in evening 08/21/13   Cameron Sprang, MD  LAC-HYDRIN FIVE 5 % LOTN lotion APPLY TO AFFECTED AREA(S) TWICE DAILY 12/06/11   Ricard Dillon, MD  olmesartan-hydrochlorothiazide (BENICAR HCT) 40-25 MG per tablet Take 1 tablet by mouth daily.    Historical Provider, MD  omeprazole (PRILOSEC) 20 MG capsule Take 1 capsule by mouth as needed 01/08/13   Ricard Dillon, MD  ondansetron [facility]) 8 MG tablet Take one tablet by mouth every eight hours as needed  for nausea 11/20/13   [name], MD  polyethylene glycol [facility] / GLYCOLAX) packet Take 17 g by mouth daily.      Historical Provider, MD  PROAIR HFA 108 (90 BASE) MCG/ACT inhaler  01/09/14   Historical Provider, MD  Probiotic Product (ALIGN) 4 MG CAPS Take 1 tablet by mouth daily.     Historical Provider, MD  simvastatin (ZOCOR) 20 MG tablet TAKE ONE TABLET BY MOUTH NIGHTLY AT BEDTIME  01/01/14   [name], MD  vitamin B-12 (CYANOCOBALAMIN) 100 MCG tablet Take 500 mcg by mouth daily.     Historical Provider, MD   BP 151/77 mmHg  Pulse 87  Temp(Src) 97.6 F (36.4 C) (Oral)  Resp 18  SpO2 98% Physical Exam  Constitutional: He is oriented to person, place, and time. He appears well-developed and well-nourished. No distress.  HENT:  Head: Normocephalic.  Eyes: EOM are normal. Pupils are equal, round, and reactive to light.  Neck: No JVD present. No tracheal deviation present.  Cardiovascular: Normal rate, regular rhythm, normal heart sounds and intact distal pulses.   Pulmonary/Chest: Effort normal and breath sounds normal. No stridor. No respiratory  distress. He exhibits tenderness (left lateral).  Abdominal: Soft. Bowel sounds are normal. He exhibits no distension. There is no tenderness.  Musculoskeletal: He exhibits no edema.  Neurological: He is alert and oriented to person, place, and time.  Skin: Skin is warm.  Vitals reviewed.   ED Course  Procedures (including critical care time) Labs Review Labs Reviewed  CBC - Abnormal; Notable for the following:    MCHC 36.3 (*)    All other components within normal limits  BASIC METABOLIC PANEL  I-STAT TROPOININ, ED    Imaging Review Dg Chest 2 View  02/06/2014   CLINICAL DATA:  Left central chest pain for 1 week, history of bypass  EXAM: CHEST  2 VIEW  COMPARISON:  05/21/2012  FINDINGS: Cardiomediastinal silhouette is stable. Status post median sternotomy. No acute infiltrate or pleural effusion. No pulmonary edema. Mild degenerative changes lower thoracic spine.  IMPRESSION: No active cardiopulmonary disease.   Electronically Signed   By: Lahoma Crocker M.D.   On: 02/06/2014 17:09     EKG Interpretation   Date/Time:  Tuesday February 06 2014 15:24:34 EST Ventricular Rate:  87 PR Interval:  170 QRS Duration: 86 QT Interval:  348 QTC Calculation: 418 R Axis:   -8 Text Interpretation:  Normal sinus rhythm Possible Anterior infarct , age  undetermined Abnormal ECG No significant change since last tracing  Confirmed by POLLINA  MD, CHRISTOPHER [phone]) on 02/06/2014 5:01:43 PM      MDM   Final diagnoses:  Chest pain    73 y/o male with h/o CAD s/p CABG 2001 with progressive chest pain on exertion x 2-3 wks. Now present at rest with associated SOB. EKG without acute changes. Troponin negative. No change with nitroglycerin. Will admit to Cardiology for ACS r/o.     Amparo Bristol, MD 02/07/14 [place], MD 02/07/14 [phone]

## 2014-02-06 NOTE — ED Notes (Signed)
Cardiology at bedside.

## 2014-02-06 NOTE — ED Provider Notes (Signed)
Patient presented to the ER with chest pain. Has been experiencing a sharp left-sided chest pain today, but also reports intermittent dull aching pain with exertion over the last several weeks. Has a history of coronary artery disease.  Face to face Exam: HEENT - PERRLA Lungs - CTAB Heart - RRR, no M/R/G Abd - S/NT/ND Neuro - alert, oriented x3  Plan: EKG unchanged from previous. Troponin negative. Cardiology consult to admit the patient for further management.   Orpah Greek, MD 02/06/14 1925

## 2014-02-06 NOTE — Progress Notes (Signed)
Pt stated that he does wear CPAP at home but does not wish to wear it here. He said if he is going to be here more than a night he will bring his from home. RT explained if he changed his mind to call and we would set him up. RN aware. RT will monitor.

## 2014-02-06 NOTE — ED Notes (Signed)
Pt called back for triage when pt got back for triage he stated he had to go to the bathroom before anything else was done.

## 2014-02-06 NOTE — Consult Note (Signed)
CARDIOLOGY CONSULT NOTE   Patient ID: Joshua [name] [name] MRN: [id number] DOB/AGE: 1940/05/21 73 y.o.  Admit date: 02/06/2014  Primary Physician   Joshua Reddish, MD Primary Cardiologist   Joshua [name] [name] Reason for Consultation   Chest pain  [name] [name] is Joshua [name] 73 y.o. male with Joshua [name] history of CAD. He had bypass surgery in 2001 and has done very well since then. He was last seen by Joshua. Percival [name] in January 2015, and was doing well. His last ischemic evaluation was Joshua [name] treadmill in 2014.  He has not been feeling generally well for about 2 weeks, complaining of fatigue. He has recently rededicated himself to his exercise program. 4 days ago, he had Joshua [name] strenuous workout and had some chest pain after completing it. The chest pain resolved without intervention. It was left-sided and sharp. 2 days later,, he had another strenuous workout and was extremely fatigued after that. He laid on the couch and was not interested in the football game of his favorite team, which was unusual for him.  Today, he had chest pain again but this time it was without significant exertion. He called the office and came to the emergency room as requested. In the emergency room, he is still having pain at Joshua [name] 5 or 6/10. The pain was unchanged by sublingual nitroglycerin and his chest wall is tender to palpation.   Of note, the major symptoms he had prior to his bypass surgery in 2001 was diaphoresis and possibly fatigue. He has not had diaphoresis. He has slightly increased dyspnea on exertion more than usual, but has been exercising and getting his heart rate up without difficulty.   Past Medical History  Diagnosis Date  . Asthma   . CAD (coronary artery disease)     CABG 2001  . Depression   . GERD (gastroesophageal reflux disease)   . Hyperlipidemia   . IBS (irritable bowel syndrome)   . Acute medial meniscal tear   . DJD (degenerative joint disease)   . Hypertension   . Anemia   . Diverticulosis   .  Obesity   . Pancreatitis   . PUD (peptic ulcer disease)   . Pneumonia   . Sleep apnea   . Prostate cancer   . Acute sinusitis 12/14/2006    Qualifier: Diagnosis of  By: Arnoldo Morale MD, Balinda Quails      Past Surgical History  Procedure Laterality Date  . Thyroidectomy, partial      age 39  . Coronary artery bypass graft  2001    SVG-RI1-RI2, SVG-OM, SVG-dRCA  . Cholecystectomy    . Cervical fusion    . Cervical laminectomy  05/26/06  . Arthroscopy rt knee  04/2008    daldoerf. meniscus.   . Prostatectomy  04/2010  . Nasal sinus surgery      right    Allergies  Allergen Reactions  . Azithromycin     Dizziness and double vision   . Bromfed     REACTION: n\T\v  . Clarithromycin     REACTION: gastritis  . Codeine   . Doxycycline Hyclate     REACTION: n\T\v  . Erythromycin Base     REACTION: rash  . Hydrocodone-Acetaminophen     REACTION: rash  . Modafinil     REACTION: anxiety-nervousness  . Promethazine Hcl     REACTION: fainting  . Sulfonamide Derivatives     REACTION: rash  . Telithromycin     REACTION: nausea    I  have reviewed the patient's current medications . ibuprofen  800 mg Oral Once     nitroGLYCERIN  Medication Sig  aspirin 81 MG tablet Take 81 mg by mouth at bedtime.   beclomethasone (QVAR) 80 MCG/ACT inhaler Inhale 1 puff into the lungs twice daily  Coenzyme Q10 (CO Q 10 PO) Take 1 tablet by mouth daily.  desloratadine (CLARINEX) 5 MG tablet Take 5 mg by mouth at bedtime.   diazepam (VALIUM) 5 MG tablet TAKE ONE TABLET BY MOUTH AT BEDTIME AS NEEDED   fluticasone (FLONASE) 50 MCG/ACT nasal spray 1 sprays  Each nostril twice daily  folic acid (FOLVITE) 1 MG tablet Take 1 mg by mouth daily.    gabapentin (NEURONTIN) 100 MG capsule Take 1 in AM, 3 in evening  LAC-HYDRIN FIVE 5 % LOTN lotion APPLY TO AFFECTED AREA(S) TWICE DAILY  olmesartan-hydrochlorothiazide (BENICAR HCT) 40-25 MG per tablet Take 1 tablet by mouth daily.  omeprazole (PRILOSEC) 20 MG  capsule Take 1 capsule by mouth as needed  ondansetron (ZOFRAN) 8 MG tablet Take one tablet by mouth every eight hours as needed  for nausea  polyethylene glycol (MIRALAX / GLYCOLAX) packet Take 17 g by mouth daily.    PROAIR HFA 108 (90 BASE) MCG/ACT inhaler   Probiotic Product (ALIGN) 4 MG CAPS Take 1 tablet by mouth daily.   simvastatin (ZOCOR) 20 MG tablet TAKE ONE TABLET BY MOUTH NIGHTLY AT BEDTIME   vitamin B-12 (CYANOCOBALAMIN) 100 MCG tablet Take 500 mcg by mouth daily.     History   Social History  . Marital Status: Married    Spouse Name: N/Joshua [name]    Number of Children: N/Joshua [name]  . Years of Education: N/Joshua [name]   Occupational History  . retired    Social History Main Topics  . Smoking status: Former Smoker -- 1.00 packs/day for 35 years    Types: Cigarettes    Quit date: 06/16/1992  . Smokeless tobacco: Never Used     Comment: smoked 1ppd for 35 years  . Alcohol Use: Yes     Comment: occ  . Drug Use: No  . Sexual Activity: Yes   Other Topics Concern  . Not on file   Social History Narrative   Married 42 years in 2015. No kids (mumps at age 21)   Retired from [employer] in [employer] 2x Joshua [name] week   Hobbies: Chief Strategy Officer, movies and tv, staying active    Family Status  Relation Status Death Age  . Mother Deceased    Family History  Problem Relation Age of Onset  . Heart disease Father   . Heart disease Mother      ROS:  Full 14 point review of systems complete and found to be negative unless listed above.  Physical Exam: Blood pressure 128/71, pulse 72, temperature 97.6 F (36.4 C), temperature source Oral, resp. rate 17, SpO2 100 %.  General: Well developed, well nourished, male in no acute distress Head: Eyes PERRLA, No xanthomas.   Normocephalic and atraumatic, oropharynx without edema or exudate. Dentition: good Lungs: CTA bilaterally; chest wall is tender near the left midline lower rib edge. Heart: HRRR S1 S2, no rub/gallop, no murmur. pulses are 2+ all 4  extrem.   Neck: No carotid bruits. No lymphadenopathy.  JVD not elevated. Abdomen: Bowel sounds present, abdomen soft and non-tender without masses or hernias noted. Msk:  No spine or cva tenderness. No weakness, no joint deformities or effusions. Extremities: No clubbing or cyanosis. no edema.  Neuro: Alert and oriented X 3. No focal deficits noted. Psych:  Good affect, responds appropriately Skin: No rashes or lesions noted.  Labs:   Lab Results  Component Value Date   WBC 7.7 02/06/2014   HGB 15.0 02/06/2014   HCT 41.3 02/06/2014   MCV 86.8 02/06/2014   PLT 200 02/06/2014     Recent Labs Lab 02/06/14 1535  NA 134*  K 4.1  CL 95*  CO2 27  BUN 19  CREATININE 1.33  CALCIUM 9.7  GLUCOSE 88    Recent Labs  02/06/14 1550  TROPIPOC 0.00   No results found for: PROBNP Lab Results  Component Value Date   CHOL 167 11/09/2013   HDL 43.90 11/09/2013   LDLCALC 90 11/09/2013   TRIG 168.0* 11/09/2013   TSH  Date/Time Value Ref Range Status  11/09/2013 09:12 AM 0.72 0.35 - 4.50 uIU/mL Final   T3, FREE  Date/Time Value Ref Range Status  11/30/2012 09:58 AM 2.7 2.3 - 4.2 pg/mL Final   Echo: 05/10/2012 Conclusions Left ventricle: The cavity size was normal. Wall thickness was increased in Joshua [name] pattern of mild LVH. Systolic function was normal. The estimated ejection fraction was in the range of 60% to 65%. Wall motion was normal; there were no regional wall motion abnormalities. There was an increased relative contribution of atrial contraction to ventricular filling.  ECG: sinus rhythm, no acute ischemic changes   Radiology:  Dg Chest 2 View  02/06/2014   CLINICAL DATA:  Left central chest pain for 1 week, history of bypass  EXAM: CHEST  2 VIEW  COMPARISON:  05/21/2012  FINDINGS: Cardiomediastinal silhouette is stable. Status post median sternotomy. No acute infiltrate or pleural effusion. No pulmonary edema. Mild degenerative changes lower thoracic spine.   IMPRESSION: No active cardiopulmonary disease.   Electronically Signed   By: Lahoma Crocker M.D.   On: 02/06/2014 17:09    ASSESSMENT AND PLAN:   The patient was seen today by Joshua. Stanford Breed, the patient evaluated and the data reviewed.  Principal Problem:   Precordial pain Active Problems:   CKD (chronic kidney disease), stage III   Signed: Rosaria Ferries, PA-C 02/06/2014 6:19 PM Beeper [phone]  Co-Sign MD As above, patient seen and examined. Briefly he is Joshua [name] 73 year old male with Joshua [name] past medical history of coronary artery disease status post coronary artery bypass graft, hypertension, hyperlipidemia, irritable bowel syndrome, For evaluation of chest pain. The patient states that over the past 2 weeks he has had 1 minute of chest pressure with the initiation of exercise. This has happened on 3 separate occasions. It resolves with continuing exercise. Otherwise no dyspnea on exertion, orthopnea, PND or pedal edema. He developed left-sided sharp chest pain today after lifting weights. The pain increased with palpation. No associated symptoms. No radiation. Electrocardiogram shows no acute ST changes. Initial enzymes negative. Will rule out myocardial infarction with serial enzymes. If negative would plan to discharge tomorrow morning with outpatient nuclear study for risk stratification. Continue aspirin and statin. Kirk Ruths

## 2014-02-06 NOTE — Progress Notes (Signed)
Outpatient Exercise Myoview ordered.

## 2014-02-06 NOTE — ED Notes (Signed)
Pt states his pain is still 5/10. Cardiology aware

## 2014-02-06 NOTE — Telephone Encounter (Signed)
Received direct call from scheduling from pt. He reports chest discomfort last Tuesday while exercising. Chest pain again on Saturday with exercise. Felt wiped out rest of Saturday and Sunday. Pt has not had pain until past week. This AM had pain while exercising. Also had indigestion. Pain went away but has since returned. Pt is having discomfort at this time.  I have instructed pt to call EMS for transport to hospital. Pt does not want to call EMS but will go to hospital.

## 2014-02-06 NOTE — ED Notes (Signed)
Ordered heart healthy meal tray for pt. 

## 2014-02-06 NOTE — ED Notes (Signed)
Presents with intermittent sharp left sided non radiating chest pain began a few weeks ago while taking a brisk walk. Since it began pain has been with rest and with exertion. This AM woke up with indigestion and chest pain. Today pain has not gone away. Denies radiation and nausea. Endorses SOB at times.

## 2014-02-07 DIAGNOSIS — J45909 Unspecified asthma, uncomplicated: Secondary | ICD-10-CM | POA: Diagnosis not present

## 2014-02-07 DIAGNOSIS — R072 Precordial pain: Secondary | ICD-10-CM | POA: Diagnosis not present

## 2014-02-07 DIAGNOSIS — R110 Nausea: Secondary | ICD-10-CM | POA: Diagnosis not present

## 2014-02-07 DIAGNOSIS — I2510 Atherosclerotic heart disease of native coronary artery without angina pectoris: Secondary | ICD-10-CM | POA: Diagnosis not present

## 2014-02-07 DIAGNOSIS — R079 Chest pain, unspecified: Secondary | ICD-10-CM | POA: Diagnosis not present

## 2014-02-07 LAB — MRSA PCR SCREENING: MRSA by PCR: NEGATIVE

## 2014-02-07 LAB — TROPONIN I
Troponin I: <0.3 ng/mL (ref <0.30)
Troponin I: <0.3 ng/mL (ref <0.30)

## 2014-02-07 MED ORDER — SIMETHICONE 80 MG PO CHEW
80.0000 mg | CHEWABLE_TABLET | Freq: Four times a day (QID) | ORAL | Status: DC | PRN
  Administered 2014-02-07: 80 mg via ORAL
  Filled 2014-02-07: qty 1

## 2014-02-07 MED ORDER — MORPHINE SULFATE 2 MG/ML IJ SOLN
2.0000 mg | Freq: Once | INTRAMUSCULAR | Status: AC
  Administered 2014-02-07: 2 mg via INTRAVENOUS
  Filled 2014-02-07: qty 1

## 2014-02-07 NOTE — Discharge Summary (Signed)
Physician Discharge Summary     Cardiologist:  Hochrein Patient ID: Joshua [name] MRN: [id number] DOB/AGE: [birth year] 73 y.o.  Admit date: 02/06/2014 Discharge date: 02/07/2014  Admission Diagnoses:  Chest pain, atypical  Discharge Diagnoses:  Principal Problem:   Precordial pain Active Problems:   CKD (chronic kidney disease), stage III   CAD  Discharged Condition: stable  Hospital Course:  The patient is a 73 y.o. male with a history of CAD. He had bypass surgery in 2001 and has done very well since then. He was last seen by Dr. Percival Spanish in January 2015, and was doing well. His last ischemic evaluation was a treadmill in 2014.   He has not been feeling generally well for about 2 weeks, complaining of fatigue. He has recently rededicated himself to his exercise program. 4 days ago, he had a strenuous workout and had some chest pain after completing it. The chest pain resolved without intervention. It was left-sided and sharp. 2 days later,, he had another strenuous workout and was extremely fatigued after that. He laid on the couch and was not interested in the football game of his favorite team, which was unusual for him.  Today, he had chest pain again but this time it was without significant exertion. He called the office and came to the emergency room as requested. In the emergency room, he is still having pain at a 5 or 6/10. The pain was unchanged by sublingual nitroglycerin and his chest wall is tender to palpation.   Of note, the major symptoms he had prior to his bypass surgery in 2001 was diaphoresis and possibly fatigue. He has not had diaphoresis. He has slightly increased dyspnea on exertion more than usual, but has been exercising and getting his heart rate up without difficulty.  He was admitted and ruled out for MI.  His pain is musculoskeletal.  IV morphine given which eased the pain.  He was scheduled for an stress test in the office.  ASA and statin continued.   The patient was seen by Dr. Mare Ferrari who felt he was stable for DC home.    Consults: None  Significant Diagnostic Studies:  CHEST 2 VIEW  COMPARISON: 05/21/2012  FINDINGS: Cardiomediastinal silhouette is stable. Status post median sternotomy. No acute infiltrate or pleural effusion. No pulmonary edema. Mild degenerative changes lower thoracic spine.  IMPRESSION: No active cardiopulmonary disease.  Treatments: See above  Discharge Exam: Blood pressure 129/75, pulse 63, temperature 97.2 F (36.2 C), temperature source Oral, resp. rate 16, height 5\' 5"  (1.651 m), weight 200 lb (90.719 kg), SpO2 97 %.   Disposition: 01-Home or Self Care      Discharge Instructions    Diet - low sodium heart healthy    Complete by:  As directed             Medication List    TAKE these medications        acetaminophen 500 MG tablet  Commonly known as:  TYLENOL  Take 1,000 mg by mouth every 6 (six) hours as needed for headache (pain).     ALIGN 4 MG Caps  Take 4 mg by mouth daily.     aspirin EC 81 MG tablet  Take 81 mg by mouth daily.     beclomethasone 80 MCG/ACT inhaler  Commonly known as:  QVAR  Inhale 1 puff into the lungs 2 (two) times daily.     desloratadine 5 MG tablet  Commonly known as:  CLARINEX  Take 5  mg by mouth at bedtime.     diazepam 5 MG tablet  Commonly known as:  VALIUM  Take 5 mg by mouth at bedtime as needed for anxiety (sleep).     fluticasone 50 MCG/ACT nasal spray  Commonly known as:  FLONASE  Place 1 spray into both nostrils 2 (two) times daily.     folic acid 1 MG tablet  Commonly known as:  FOLVITE  Take 1 mg by mouth daily.     gabapentin 100 MG capsule  Commonly known as:  NEURONTIN  Take 100-200 mg by mouth 3 (three) times daily. Take 1 capsule (100 mg) with breakfast and supper, take 2 capsules (200 mg) at bedtime     LAC-HYDRIN FIVE 5 % Lotn lotion  Generic drug:  ammonium lactate  APPLY TO AFFECTED AREA(S) TWICE DAILY       olmesartan-hydrochlorothiazide 40-25 MG per tablet  Commonly known as:  BENICAR HCT  Take 1 tablet by mouth daily with supper.     omeprazole 20 MG capsule  Commonly known as:  PRILOSEC  Take 20 mg by mouth daily as needed (acid reflux/heartburn).     ondansetron 8 MG tablet  Commonly known as:  ZOFRAN  Take 8 mg by mouth every 8 (eight) hours as needed for nausea or vomiting.     polyethylene glycol packet  Commonly known as:  MIRALAX / GLYCOLAX  Take 17 g by mouth daily.     PROAIR HFA 108 (90 BASE) MCG/ACT inhaler  Generic drug:  albuterol  Inhale 1-2 puffs into the lungs every 6 (six) hours as needed for wheezing or shortness of breath.     simvastatin 20 MG tablet  Commonly known as:  ZOCOR  TAKE ONE TABLET BY MOUTH NIGHTLY AT BEDTIME     Vitamin B-12 500 MCG Subl  Place 500 mcg under the tongue daily.       Follow-up Information    Follow up with [name], MD On 03/15/2014.   Specialty:  Cardiology   Why:  9:15 AM   Contact information:   [address] [place] [id number] [phone]      Greater than 30 minutes was spent completing the patient's discharge.    SignedTarri Fuller, [place] 02/07/2014, 10:49 AM

## 2014-02-07 NOTE — Progress Notes (Signed)
Subjective: Sharp  Localized CP comes and goes.  It is reproducible with palpation.  Objective: Vital signs in last 24 hours: Temp:  [97.2 F (36.2 C)-97.8 F (36.6 C)] 97.2 F (36.2 C) (11/18 0818) Pulse Rate:  [60-87] 63 (11/18 0818) Resp:  [14-22] 16 (11/18 0818) BP: (109-151)/(65-84) 129/75 mmHg (11/18 0818) SpO2:  [97 %-100 %] 97 % (11/18 0818) Weight:  [200 lb (90.719 kg)-201 lb 9.6 oz (91.445 kg)] 200 lb (90.719 kg) (11/18 0516) Last BM Date: 02/06/14 (had two small loose bms today per patient)  Intake/Output from previous day: 11/17 0701 - 11/18 0700 In: 480 [P.O.:480] Out: 100 [Urine:100] Intake/Output this shift:    Medications Current Facility-Administered Medications  Medication Dose Route Frequency Provider Last Rate Last Dose  . acetaminophen (TYLENOL) tablet 650 mg  650 mg Oral Q4H PRN Rhonda G Barrett, PA-C      . acidophilus (RISAQUAD) capsule 1 capsule  1 capsule Oral Daily Rhonda G Barrett, PA-C      . albuterol (PROVENTIL) (2.5 MG/3ML) 0.083% nebulizer solution 2.5 mg  2.5 mg Nebulization Q6H PRN Rhonda G Barrett, PA-C      . ALPRAZolam Duanne Moron) tablet 0.25 mg  0.25 mg Oral BID PRN Evelene Croon Barrett, PA-C   0.25 mg at 02/06/14 2214  . ammonium lactate (LAC-HYDRIN) 12 % lotion   Topical BID Lelon Perla, MD      . aspirin EC tablet 81 mg  81 mg Oral Daily Evelene Croon Barrett, PA-C   81 mg at 02/06/14 2159  . cyanocobalamin tablet 500 mcg  500 mcg Oral Daily Rhonda G Barrett, PA-C      . enoxaparin (LOVENOX) injection 40 mg  40 mg Subcutaneous Q24H Rhonda G Barrett, PA-C   40 mg at 02/06/14 2155  . fentaNYL (SUBLIMAZE) injection 25-50 mcg  25-50 mcg Intravenous Q2H PRN Evelene Croon Barrett, PA-C   50 mcg at 02/07/14 0327  . fluticasone (FLONASE) 50 MCG/ACT nasal spray 1 spray  1 spray Each Nare BID Evelene Croon Barrett, PA-C   1 spray at 02/06/14 2155  . fluticasone (FLOVENT HFA) 44 MCG/ACT inhaler 1 puff  1 puff Inhalation BID Skeet Latch, MD   1 puff at  02/06/14 2245  . folic acid (FOLVITE) tablet 1 mg  1 mg Oral Daily Rhonda G Barrett, PA-C   1 mg at 02/06/14 1839  . gabapentin (NEURONTIN) capsule 100 mg  100 mg Oral Daily Rhonda G Barrett, PA-C      . gabapentin (NEURONTIN) capsule 300 mg  300 mg Oral QHS Rhonda G Barrett, PA-C   300 mg at 02/06/14 2155  . hydrochlorothiazide (HYDRODIURIL) tablet 25 mg  25 mg Oral Daily Rhonda G Barrett, PA-C      . irbesartan (AVAPRO) tablet 300 mg  300 mg Oral Daily Rhonda G Barrett, PA-C      . ketorolac (TORADOL) 30 MG/ML injection 15 mg  15 mg Intravenous 4 times per day Lonn Georgia, PA-C   15 mg at 02/07/14 0208  . loratadine (CLARITIN) tablet 10 mg  10 mg Oral QHS Rhonda G Barrett, PA-C   10 mg at 02/06/14 2155  . nitroGLYCERIN (NITROSTAT) SL tablet 0.4 mg  0.4 mg Sublingual Q5 min PRN Amparo Bristol, MD   0.4 mg at 02/06/14 1709  . ondansetron (ZOFRAN) injection 4 mg  4 mg Intravenous Q6H PRN Rhonda G Barrett, PA-C      . pantoprazole (PROTONIX) EC tablet 40 mg  40 mg Oral  Daily Evelene Croon Barrett, PA-C   40 mg at 02/06/14 1839  . polyethylene glycol (MIRALAX / GLYCOLAX) packet 17 g  17 g Oral Daily Rhonda G Barrett, PA-C      . simethicone (MYLICON) chewable tablet 80 mg  80 mg Oral QID PRN Skeet Latch, MD   80 mg at 02/07/14 0344  . simvastatin (ZOCOR) tablet 20 mg  20 mg Oral QHS Rhonda G Barrett, PA-C   20 mg at 02/06/14 2154  . zolpidem (AMBIEN) tablet 5 mg  5 mg Oral QHS PRN Lonn Georgia, PA-C        PE: General appearance: alert, cooperative and no distress Lungs: clear to auscultation bilaterally Heart: regular rate and rhythm, S1, S2 normal, no murmur, click, rub or gallop Extremities: No LEE Pulses: 2+ and symmetric Skin: Warm and dry Neurologic: Grossly normal  Lab Results:   Recent Labs  02/06/14 1535  WBC 7.7  HGB 15.0  HCT 41.3  PLT 200   BMET  Recent Labs  02/06/14 1535  NA 134*  K 4.1  CL 95*  CO2 27  GLUCOSE 88  BUN 19  CREATININE 1.33  CALCIUM 9.7     Assessment/Plan  73 y.o. male with a history of CAD. He had bypass surgery in 2001 and has done very well since then. His last ischemic evaluation was a treadmill in 2014.  Principal Problem:   Precordial pain Active Problems:   CKD (chronic kidney disease), stage III  Plan:   His CP is musculoskeletal probably brought on by the recent weight lifting(chest) and sanding doors for his wife.  Ruled out for MI.  BP and HR stable.  OP exercise myoview ordered. NSR on EKG.   DC home today.    LOS: 1 day    HAGER, BRYAN PA-C 02/07/2014 8:39 AM  Patient seen. His chest pain is musculoskeletal and reproducible with palpation of chest. Okay for discharge today with subsequent outpatient treadmill myoview. Exam reveals clear lungs, heart no pericardial rub.

## 2014-02-07 NOTE — Consult Note (Signed)
H and P Please see consult note from 02/06/14; mislabeled as consult note. Kirk Ruths

## 2014-02-08 ENCOUNTER — Ambulatory Visit (INDEPENDENT_AMBULATORY_CARE_PROVIDER_SITE_OTHER): Payer: Medicare Other | Admitting: Family Medicine

## 2014-02-08 ENCOUNTER — Ambulatory Visit: Payer: Medicare Other | Admitting: Family Medicine

## 2014-02-08 ENCOUNTER — Encounter: Payer: Self-pay | Admitting: Family Medicine

## 2014-02-08 VITALS — BP 108/68 | HR 84 | Temp 98.4°F | Wt 202.0 lb

## 2014-02-08 DIAGNOSIS — Z09 Encounter for follow-up examination after completed treatment for conditions other than malignant neoplasm: Secondary | ICD-10-CM

## 2014-02-08 DIAGNOSIS — J3089 Other allergic rhinitis: Secondary | ICD-10-CM | POA: Diagnosis not present

## 2014-02-08 DIAGNOSIS — J3081 Allergic rhinitis due to animal (cat) (dog) hair and dander: Secondary | ICD-10-CM | POA: Diagnosis not present

## 2014-02-08 DIAGNOSIS — R0789 Other chest pain: Secondary | ICD-10-CM

## 2014-02-08 DIAGNOSIS — J301 Allergic rhinitis due to pollen: Secondary | ICD-10-CM | POA: Diagnosis not present

## 2014-02-08 NOTE — Progress Notes (Signed)
Pre visit review using our clinic review tool, if applicable. No additional management support is needed unless otherwise documented below in the visit note. 

## 2014-02-08 NOTE — Progress Notes (Signed)
[name], MD Phone: [phone]  Subjective:   Joshua [name] is a 73 y.o. year old very pleasant male patient who present sfor hospital follow up after admission for chest pain in patient with CAD.   Patient was hospitalized overnight on 02/06/2014. He was admitted for chest pain and his pain was later determined to be musculoskeletal in nature. This pain occurred after an exercise program. Nitroglycerin did not help the pain but it dose of morphine eased his pain. He did have pain with palpation of his chest.  Patient never had shortness of breath, nausea, vomiting. No diaphoresis. He did complain of fatigue. Patient was ruled out for MI with negative troponins. Plan was for outpatient stress testing through [facility] heart care.   Today, patient reports no chest pain since returning home. No shortness of breath. Is fatigued after returning from hospital. He continues to be compliant with his aspirin and statin.  ROS-no nausea vomiting fever chills chest pain shortness breath.   Past Medical History- Patient Active Problem List   Diagnosis Date Noted  . Coronary atherosclerosis 10/12/2006    Priority: High  . Irritable bowel syndrome 10/12/2006    Priority: High  . Other malaise and fatigue 11/09/2013    Priority: Medium  . CKD (chronic kidney disease), stage III 10/23/2013    Priority: Medium  . Dizziness 08/23/2013    Priority: Medium  . Neuropathy 08/21/2013    Priority: Medium  . 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 TOBACCO ABUSE, HX OF 12/26/2008    Priority: Medium  . HYPERTENSION 02/21/2007    Priority: Medium  . HYPERLIPIDEMIA 10/12/2006    Priority: Medium  . DEPRESSION 10/12/2006    Priority: Medium  . ASTHMA 10/12/2006    Priority: Medium  . RLS (restless legs syndrome) 01/03/2013    Priority: Low  . Low back pain radiating to left leg 10/09/2011    Priority: Low  . OSTEOARTHRITIS, LOWER LEG, LEFT 10/04/2009   Priority: Low  . ANEMIA, IRON DEFICIENCY 11/06/2008    Priority: Low  . 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 OSA (obstructive sleep apnea) 11/02/2006    Priority: Low  . GERD 10/12/2006    Priority: Low  . Precordial pain 02/06/2014   Medications- reviewed and updated Current Outpatient Prescriptions  Medication Sig Dispense Refill  . acetaminophen (TYLENOL) 500 MG tablet Take 1,000 mg by mouth every 6 (six) hours as needed for headache (pain).    [name] albuterol (PROAIR HFA) 108 (90 BASE) MCG/ACT inhaler Inhale 1-2 puffs into the lungs every 6 (six) hours as needed for wheezing or shortness of breath.    [name] aspirin EC 81 MG tablet Take 81 mg by mouth daily.    . beclomethasone (QVAR) 80 MCG/ACT inhaler Inhale 1 puff into the lungs 2 (two) times daily.    . Cyanocobalamin (VITAMIN B-12) 500 MCG SUBL Place 500 mcg under the tongue daily.    [name] desloratadine (CLARINEX) 5 MG tablet Take 5 mg by mouth at bedtime.     . diazepam (VALIUM) 5 MG tablet Take 5 mg by mouth at bedtime as needed for anxiety (sleep).    . fluticasone (FLONASE) 50 MCG/ACT nasal spray Place 1 spray into both nostrils 2 (two) times daily.     . folic acid (FOLVITE) 1 MG tablet Take 1 mg by mouth daily.      [name] gabapentin (NEURONTIN) 100 MG capsule Take 100-200 mg by  mouth 3 (three) times daily. Take 1 capsule (100 mg) with breakfast and supper, take 2 capsules (200 mg) at bedtime    . LAC-HYDRIN FIVE 5 % LOTN lotion APPLY TO AFFECTED AREA(S) TWICE DAILY 120 mL 2  . olmesartan-hydrochlorothiazide (BENICAR HCT) 40-25 MG per tablet Take 1 tablet by mouth daily with supper.     [name] omeprazole (PRILOSEC) 20 MG capsule Take 20 mg by mouth daily as needed (acid reflux/heartburn).    . ondansetron (ZOFRAN) 8 MG tablet Take 8 mg by mouth every 8 (eight) hours as needed for nausea or vomiting.    . polyethylene glycol (MIRALAX / GLYCOLAX) packet Take 17 g by mouth daily.      . Probiotic Product (ALIGN) 4 MG CAPS  Take 4 mg by mouth daily.     . simvastatin (ZOCOR) 20 MG tablet TAKE ONE TABLET BY MOUTH NIGHTLY AT BEDTIME  (Patient taking differently: TAKE ONE TABLET BY MOUTH NIGHTLY AT BEDTIME) 30 tablet 2   No current facility-administered medications for this visit.    Objective: BP 108/68 mmHg  Pulse 84  Temp(Src) 98.4 F (36.9 C) (Oral)  Wt 202 lb (91.627 kg)  SpO2 98% Gen: NAD, resting comfortably CV: RRR no murmurs rubs or gallops No chest wall tenderness Lungs: CTAB no crackles, wheeze, rhonchi Abdomen: soft/nontender/nondistended/normal bowel sounds.  Skin: warm, dry, no rash Neuro: grossly normal, moves all extremities   Assessment/Plan:  Hospital follow up for chest pain Was MSK in nature. Ruled out for MI That pain has resolved. Patient still recovering from being sedentary in hospital and I discussed a gradual approach to gaining his strength back. He will have stress testing at the beginning of next month with cards follow up after that. Discussed reasons for return with patient.   Patient also updates me that his neuropathy is better since starting CPAP. I told him I was happy to hear about this good news.   We performed medication reconciliation and there have been no significant changes.

## 2014-02-08 NOTE — Patient Instructions (Addendum)
Hospital follow up  Glad you are doing better and no further chest pain!   Stress test 12/3  Cardiology follow up after that  Let us know if you have recurrence of symptoms but I think this being cardiac in nature is very low risk. It is ok to return to your actvities as long as you don't have recurrence of symptoms.   Check in 3-6 months unless need to see me sooner

## 2014-02-09 NOTE — H&P (Signed)
Please see consult note dated 02/06/14; mistakenly labeled consult note. Joshua [name]

## 2014-02-19 DIAGNOSIS — J301 Allergic rhinitis due to pollen: Secondary | ICD-10-CM | POA: Diagnosis not present

## 2014-02-21 ENCOUNTER — Encounter (HOSPITAL_COMMUNITY): Payer: Medicare Other

## 2014-02-22 ENCOUNTER — Ambulatory Visit (HOSPITAL_COMMUNITY)
Admission: RE | Admit: 2014-02-22 | Discharge: 2014-02-22 | Disposition: A | Discharge status: Home / Self Care | Payer: Medicare Other | Source: Ambulatory Visit | Attending: Cardiovascular Disease | Admitting: Cardiovascular Disease

## 2014-02-22 DIAGNOSIS — I25118 Atherosclerotic heart disease of native coronary artery with other forms of angina pectoris: Secondary | ICD-10-CM | POA: Diagnosis not present

## 2014-02-22 DIAGNOSIS — R079 Chest pain, unspecified: Secondary | ICD-10-CM | POA: Insufficient documentation

## 2014-02-22 DIAGNOSIS — R0609 Other forms of dyspnea: Secondary | ICD-10-CM | POA: Diagnosis not present

## 2014-02-22 DIAGNOSIS — R002 Palpitations: Secondary | ICD-10-CM | POA: Insufficient documentation

## 2014-02-22 DIAGNOSIS — R072 Precordial pain: Secondary | ICD-10-CM | POA: Diagnosis not present

## 2014-02-22 DIAGNOSIS — Z951 Presence of aortocoronary bypass graft: Secondary | ICD-10-CM | POA: Diagnosis not present

## 2014-02-22 DIAGNOSIS — R5383 Other fatigue: Secondary | ICD-10-CM | POA: Diagnosis not present

## 2014-02-22 DIAGNOSIS — I2510 Atherosclerotic heart disease of native coronary artery without angina pectoris: Secondary | ICD-10-CM | POA: Diagnosis not present

## 2014-02-22 DIAGNOSIS — R42 Dizziness and giddiness: Secondary | ICD-10-CM | POA: Insufficient documentation

## 2014-02-22 MED ORDER — TECHNETIUM TC 99M SESTAMIBI GENERIC - CARDIOLITE
30.0000 | Freq: Once | INTRAVENOUS | Status: AC | PRN
  Administered 2014-02-22: 30 via INTRAVENOUS

## 2014-02-22 MED ORDER — TECHNETIUM TC 99M SESTAMIBI GENERIC - CARDIOLITE
10.0000 | Freq: Once | INTRAVENOUS | Status: AC | PRN
  Administered 2014-02-22: 10 via INTRAVENOUS

## 2014-02-22 NOTE — Procedures (Addendum)
[place] [address] [place] [address] [phone]  Cardiology Nuclear Med Study  Joshua [name] is a 73 y.o. male     MRN : [id number]     DOB: 02/22/1941  Procedure Date: 02/22/2014  Nuclear Med Background Indication for Stress Test:  [facility] History:  Asthma and CAD;CABG X4-2001;Last NUC MPI on 11/20/1999-scar;EF=42%;ETT on 05/02/2012 Cardiac Risk Factors: Family History - CAD, History of Smoking, Hypertension, Lipids and Obesity  Symptoms:  Chest Pain, Dizziness, DOE, Fatigue, Light-Headedness and Palpitations   Nuclear Pre-Procedure Caffeine/Decaff Intake:  7:00pm NPO After: 5:00am   IV Site: R Forearm  IV 0.9% NS with Angio Cath:  22g  Chest Size (in):  48"  IV Started by: Rolene Course, RN  Height: 5\' 3"  (1.6 m)  Cup Size: n/a  BMI:  Body mass index is 35.44 kg/(m^2). Weight:  200 lb (90.719 kg)   Tech Comments:  n/a    Nuclear Med Study 1 or 2 day study: 1 day  Stress Test Type:  Stress  Order Authorizing Provider:  Rosaria Ferries, PAC   Resting Radionuclide: Technetium 70m Sestamibi  Resting Radionuclide Dose: 10.2 mCi   Stress Radionuclide:  Technetium 37m Sestamibi  Stress Radionuclide Dose: 30.2 mCi           Stress Protocol Rest HR: 71 Stress HR: 144  Rest BP: 135/94 Stress BP: 184/92  Exercise Time (min): 9 METS: 10.1   Predicted Max HR: 149 bpm % Max HR: 96.64 bpm Rate Pressure Product: 26496  Dose of Adenosine (mg):  n/a Dose of Lexiscan: n/a mg  Dose of Atropine (mg): n/a Dose of Dobutamine: n/a mcg/kg/min (at max HR)  Stress Test Technologist: Leane Para, CCT Nuclear Technologist: Otho Perl, CNMT   Rest Procedure:  Myocardial perfusion imaging was performed at rest 45 minutes following the intravenous administration of Technetium 36m Sestamibi. Stress Procedure:  The patient performed treadmill exercise using a Bruce  Protocol for 9 minutes. The  patient stopped due to Chest Pain and SOB and denied any chest pain.  There were no significant ST-T wave changes.  Technetium 79m Sestamibi was injected IV at peak exercise and myocardial perfusion imaging was performed after a brief delay.  Transient Ischemic Dilatation (Normal <1.22):  0.94 QGS EDV:  66 ml QGS ESV:  27 ml LV Ejection Fraction: 59%   Rest ECG: NSR - Normal EKG  Stress ECG: No significant change from baseline ECG and There are scattered PVCs.  QPS Raw Data Images:  Normal; no motion artifact; normal heart/lung ratio. Stress Images:  Normal homogeneous uptake in all areas of the myocardium. Rest Images:  Normal homogeneous uptake in all areas of the myocardium. Subtraction (SDS):  No evidence of ischemia. LV Wall Motion:  NL LV Function; NL Wall Motion, EF 59%. Paradoxical septal motion.  Impression Exercise Capacity:  Good exercise capacity. BP Response:  Hypertensive blood pressure response. Clinical Symptoms:  Typical chest pain. ECG Impression:  No significant ST segment change suggestive of ischemia. Comparison with Prior Nuclear Study: Compared to 2001 (pre-CABG) study, ischemia is no longer seen and EF has normalized. Compared to 2014 treadmill ECG test, exercise duration is not changed.   Overall Impression:  Low risk stress nuclear study without significant perfusion defects, LV dysfunction or exercise induced ECG changes. The patient had a hypertensive response and did experience chest pain during exercise, relieved by rest.   Sanda Klein, MD  02/22/2014 12:45 PM

## 2014-03-02 ENCOUNTER — Ambulatory Visit (INDEPENDENT_AMBULATORY_CARE_PROVIDER_SITE_OTHER): Payer: Medicare Other | Admitting: Pulmonary Disease

## 2014-03-02 ENCOUNTER — Encounter: Payer: Self-pay | Admitting: Pulmonary Disease

## 2014-03-02 VITALS — BP 124/76 | HR 71 | Temp 98.0°F | Ht 65.0 in | Wt 202.8 lb

## 2014-03-02 DIAGNOSIS — G4733 Obstructive sleep apnea (adult) (pediatric): Secondary | ICD-10-CM

## 2014-03-02 DIAGNOSIS — I2510 Atherosclerotic heart disease of native coronary artery without angina pectoris: Secondary | ICD-10-CM | POA: Diagnosis not present

## 2014-03-02 NOTE — Progress Notes (Signed)
   Subjective:    Patient ID: Joshua [name], male    DOB: Nov 19, 1940, 73 y.o.   MRN: [id number]  HPI The patient comes in today for follow-up of his obstructive sleep apnea. It is mild in nature, and primarily an impact to his quality of life. He has been started on C Pap, and the patient has had issues with inconvenience this, but now is starting to become more settled with the treatment modality. His download shows excellent compliance over the last month, as well as good control of his AHI. The patient feels that he is starting to sleep better, and his wife has heard no snoring. He feels he is a little more rested during the day, but not up to his expectations. He is no longer requiring naps during the day.   Review of Systems  Constitutional: Negative for fever and unexpected weight change.  HENT: Negative for congestion, dental problem, ear pain, nosebleeds, postnasal drip, rhinorrhea, sinus pressure, sneezing, sore throat and trouble swallowing.   Eyes: Negative for redness and itching.  Respiratory: Negative for cough, chest tightness, shortness of breath and wheezing.   Cardiovascular: Negative for palpitations and leg swelling.  Gastrointestinal: Negative for nausea and vomiting.  Genitourinary: Negative for dysuria.  Musculoskeletal: Negative for joint swelling.  Skin: Negative for rash.  Neurological: Positive for headaches.  Hematological: Does not bruise/bleed easily.  Psychiatric/Behavioral: Negative for dysphoric mood. The patient is not nervous/anxious.        Objective:   Physical Exam Obese male in no acute distress Nose without purulence or discharge noted No skin breakdown or pressure necrosis from the C Pap mask Neck without lymphadenopathy or thyromegaly Lower extremities without edema, no cyanosis Alert and oriented, moves all 4 extremities.       Assessment & Plan:

## 2014-03-02 NOTE — Patient Instructions (Signed)
Continue on cpap, but call if you wish to consider a dental appliance Work on weight loss followup with me again in 58mos

## 2014-03-02 NOTE — Assessment & Plan Note (Signed)
The patient has been wearing C Pap compliantly over the last month, and his download shows great control of his AHI and no significant mask leak. The patient does believe that it is starting to help him with respect to his sleep and daytime alertness. However, he is still bothered by the inconvenience of C Pap, and is also considering a dental appliance. He has spoken with his dentist about this. He wishes to stay on C Pap for now, and I have encouraged him to work aggressively on weight loss.

## 2014-03-05 DIAGNOSIS — J301 Allergic rhinitis due to pollen: Secondary | ICD-10-CM | POA: Diagnosis not present

## 2014-03-05 DIAGNOSIS — J3089 Other allergic rhinitis: Secondary | ICD-10-CM | POA: Diagnosis not present

## 2014-03-05 DIAGNOSIS — J3081 Allergic rhinitis due to animal (cat) (dog) hair and dander: Secondary | ICD-10-CM | POA: Diagnosis not present

## 2014-03-06 ENCOUNTER — Ambulatory Visit: Payer: Medicare Other | Admitting: Pulmonary Disease

## 2014-03-08 DIAGNOSIS — Z09 Encounter for follow-up examination after completed treatment for conditions other than malignant neoplasm: Secondary | ICD-10-CM | POA: Diagnosis not present

## 2014-03-08 DIAGNOSIS — R0789 Other chest pain: Secondary | ICD-10-CM | POA: Diagnosis not present

## 2014-03-09 ENCOUNTER — Other Ambulatory Visit: Payer: Self-pay | Admitting: Family Medicine

## 2014-03-09 ENCOUNTER — Telehealth: Payer: Self-pay | Admitting: Neurology

## 2014-03-09 MED ORDER — GABAPENTIN 100 MG PO CAPS: ORAL_CAPSULE | ORAL | Status: DC

## 2014-03-09 NOTE — Telephone Encounter (Signed)
Called patient and left msg on his vm that refill request from pharmacy for Gabapentin has been filled. He needs to call the office to set-up follow up visit before any future refills will be authorized.

## 2014-03-13 ENCOUNTER — Ambulatory Visit (INDEPENDENT_AMBULATORY_CARE_PROVIDER_SITE_OTHER): Payer: Medicare Other | Admitting: Family Medicine

## 2014-03-13 ENCOUNTER — Encounter: Payer: Self-pay | Admitting: Family Medicine

## 2014-03-13 VITALS — BP 128/75 | HR 88 | Temp 98.3°F | Ht 65.0 in | Wt 205.0 lb

## 2014-03-13 DIAGNOSIS — L03012 Cellulitis of left finger: Secondary | ICD-10-CM

## 2014-03-13 DIAGNOSIS — I2510 Atherosclerotic heart disease of native coronary artery without angina pectoris: Secondary | ICD-10-CM

## 2014-03-13 MED ORDER — CEPHALEXIN 500 MG PO CAPS: 500.0000 mg | ORAL_CAPSULE | Freq: Three times a day (TID) | ORAL | Status: AC

## 2014-03-13 NOTE — Progress Notes (Signed)
   Subjective:    Patient ID: Joshua [name], male    DOB: [birth year], 73 y.o.   MRN: [id number]  HPI Here for infection around the left thumbnail that started 3 days ago. He is using Neosporin and warm Epsom salt soaks. No fever.    Review of Systems  Constitutional: Negative.   Skin: Positive for wound.       Objective:   Physical Exam  Constitutional: He appears well-developed and well-nourished.  Skin:  The skin around the left thumbnail is red, swollen and tender          Assessment & Plan:  Treat with Keflex. Recheck prn

## 2014-03-13 NOTE — Progress Notes (Signed)
Pre visit review using our clinic review tool, if applicable. No additional management support is needed unless otherwise documented below in the visit note. 

## 2014-03-15 ENCOUNTER — Ambulatory Visit: Payer: Medicare Other | Admitting: Cardiology

## 2014-03-26 DIAGNOSIS — J301 Allergic rhinitis due to pollen: Secondary | ICD-10-CM | POA: Diagnosis not present

## 2014-03-26 DIAGNOSIS — J3089 Other allergic rhinitis: Secondary | ICD-10-CM | POA: Diagnosis not present

## 2014-03-26 DIAGNOSIS — J3081 Allergic rhinitis due to animal (cat) (dog) hair and dander: Secondary | ICD-10-CM | POA: Diagnosis not present

## 2014-03-27 ENCOUNTER — Other Ambulatory Visit: Payer: Self-pay

## 2014-03-27 MED ORDER — OMEPRAZOLE 20 MG PO CPDR: 20.0000 mg | DELAYED_RELEASE_CAPSULE | Freq: Every day | ORAL | Status: DC | PRN

## 2014-03-27 NOTE — Telephone Encounter (Signed)
Rx request for omeprazole 20 mg- Take 1 capsule by mouth twice a day. #90  Pharm:  [facility]    Rx sent to pharmacy.

## 2014-03-29 DIAGNOSIS — J3081 Allergic rhinitis due to animal (cat) (dog) hair and dander: Secondary | ICD-10-CM | POA: Diagnosis not present

## 2014-03-29 DIAGNOSIS — J3089 Other allergic rhinitis: Secondary | ICD-10-CM | POA: Diagnosis not present

## 2014-03-29 DIAGNOSIS — J301 Allergic rhinitis due to pollen: Secondary | ICD-10-CM | POA: Diagnosis not present

## 2014-03-30 ENCOUNTER — Ambulatory Visit (INDEPENDENT_AMBULATORY_CARE_PROVIDER_SITE_OTHER): Payer: Medicare Other | Admitting: Cardiology

## 2014-03-30 ENCOUNTER — Encounter: Payer: Self-pay | Admitting: Cardiology

## 2014-03-30 VITALS — BP 135/66 | HR 83 | Ht 65.0 in | Wt 204.2 lb

## 2014-03-30 DIAGNOSIS — I10 Essential (primary) hypertension: Secondary | ICD-10-CM | POA: Diagnosis not present

## 2014-03-30 DIAGNOSIS — I25118 Atherosclerotic heart disease of native coronary artery with other forms of angina pectoris: Secondary | ICD-10-CM

## 2014-03-30 NOTE — Progress Notes (Signed)
HPI  The patient returns for one year follow up .  Since I last saw him he was hospitalized in Nov with fatigue and other symptoms.  He ruled out.  Lexiscan Myoview was negative for any evidence of ischemia.  I reviewed these records. Since that time he has done well.  The patient denies any new symptoms such as chest discomfort, neck or arm discomfort. There has been no new shortness of breath, PND or orthopnea. There have been no reported palpitations, presyncope or syncope.  He has been using CPAP for about 4 months and he thinks he is feeling better with this.   Allergies  Allergen Reactions  . Azithromycin Other (See Comments)    Dizziness and double vision   . 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 Bromfed Nausea And Vomiting  . Clarithromycin Other (See Comments)     gastritis  . Codeine Other (See Comments)    Trouble breathing  . Doxycycline Hyclate Nausea And Vomiting  . Modafinil Other (See Comments)     anxiety-nervousness  . Promethazine Hcl Other (See Comments)     fainting  . Telithromycin Nausea And Vomiting  . Erythromycin Base Rash  . Hydrocodone-Acetaminophen Rash  . Sulfonamide Derivatives Rash    Current Outpatient Prescriptions  Medication Sig Dispense Refill  . acetaminophen (TYLENOL) 500 MG tablet Take 1,000 mg by mouth every 6 (six) hours as needed for headache (pain).    [name] albuterol (PROAIR HFA) 108 (90 BASE) MCG/ACT inhaler Inhale 1-2 puffs into the lungs every 6 (six) hours as needed for wheezing or shortness of breath.    [name] aspirin EC 81 MG tablet Take 81 mg by mouth daily.    . beclomethasone (QVAR) 80 MCG/ACT inhaler Inhale 1 puff into the lungs 2 (two) times daily.    . Cyanocobalamin (VITAMIN B-12) 500 MCG SUBL Place 500 mcg under the tongue daily.    [name] desloratadine (CLARINEX) 5 MG tablet Take 5 mg by mouth at bedtime.     . diazepam (VALIUM) 5 MG tablet Take 5 mg by mouth at bedtime  as needed for anxiety (sleep).    . fluticasone (FLONASE) 50 MCG/ACT nasal spray Place 1 spray into both nostrils 2 (two) times daily.     . folic acid (FOLVITE) 1 MG tablet Take 1 mg by mouth daily.      [name] gabapentin (NEURONTIN) 100 MG capsule Take 4 capsules at bedtime. 120 capsule 1  . LAC-HYDRIN FIVE 5 % LOTN lotion APPLY TO AFFECTED AREA(S) TWICE DAILY 120 mL 2  . olmesartan-hydrochlorothiazide (BENICAR HCT) 40-25 MG per tablet Take 1 tablet by mouth daily with supper.     [name] omeprazole (PRILOSEC) 20 MG capsule Take 1 capsule (20 mg total) by mouth daily as needed (acid reflux/heartburn). 90 capsule 1  . ondansetron (ZOFRAN) 8 MG tablet Take 8 mg by mouth every 8 (eight) hours as needed for nausea or vomiting.    . polyethylene glycol (MIRALAX / GLYCOLAX) packet Take 17 g by mouth daily.      . Probiotic Product (ALIGN) 4 MG CAPS Take 4 mg by mouth daily.     . simvastatin (ZOCOR) 20 MG tablet TAKE ONE TABLET BY MOUTH NIGHTLY AT BEDTIME  (Patient taking differently: TAKE ONE TABLET BY MOUTH NIGHTLY AT BEDTIME) 30 tablet 2   No current facility-administered medications for this visit.    Past Medical History  Diagnosis  Date  . Asthma   . CAD (coronary artery disease)     CABG 2001  . Depression   . GERD (gastroesophageal reflux disease)   . Hyperlipidemia   . IBS (irritable bowel syndrome)   . Acute medial meniscal tear   . Hypertension   . Anemia   . Diverticulosis   . Obesity   . Pancreatitis ~ 1980  . Acute sinusitis 12/14/2006    Qualifier: Diagnosis of  By: Arnoldo Morale MD, Balinda Quails   . Prostate cancer 04/2010  . 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 Pneumonia 1980's X 2  . OSA on CPAP   . History of blood transfusion 2001    "related to the bypass OR"  . DJD (degenerative joint disease)   . Arthritis     "middle finger right hand; right knee; neck" (02/06/2014)  . History of gout     Past Surgical History  Procedure Laterality Date  .  Thyroidectomy, partial  1980  . Anterior cervical decomp/discectomy fusion  05/26/06  . Knee arthroscopy Right 1992; 04/2008  . Robot assisted laparoscopic radical prostatectomy  04/2010  . Nasal septum surgery Right 2007  . Back surgery    . Laparoscopic cholecystectomy  2007  . Cardiac catheterization  11/20/1999  . Coronary artery bypass graft  11/20/1999    SVG-RI1-RI2, SVG-OM, SVG-dRCA    ROS: As stated in the HPI and negative for all other systems.   PHYSICAL EXAM BP 135/66 mmHg  Pulse 83  Ht 5\' 5"  (1.651 m)  Wt 204 lb 3.2 oz (92.625 kg)  BMI 33.98 kg/m2 GENERAL:  Well appearing NECK:  No jugular venous distention, waveform within normal limits, carotid upstroke brisk and symmetric, no bruits, no thyromegaly LUNGS:  Clear to auscultation bilaterally CHEST:  Well healed sternotomy scar. HEART:  PMI not displaced or sustained,S1 and S2 within normal limits, no S3, no S4, no clicks, no rubs, no murmurs ABD:  Flat, positive bowel sounds normal in frequency in pitch, no bruits, no rebound, no guarding, no midline pulsatile mass, no hepatomegaly, no splenomegaly EXT:  2 plus pulses throughout, no edema, no cyanosis no clubbing   ASSESSMENT AND PLAN  CORONARY ARTERY DISEASE -  He has had no new symptoms since his stress test in Nov.  He will continue with risk reduction.   HYPERLIPIDEMIA -   He had a good lipid profile last year and I will defer to his primary provider.  The LDL was 90 but triglycerides slightly elevated.  HTN - The blood pressure is at target. No change in medications is indicated. We will continue with therapeutic lifestyle changes (TLC).

## 2014-03-30 NOTE — Patient Instructions (Signed)
Your physician wants you to follow-up in: [place] will receive a reminder letter in the mail two months in advance. If you don't receive a letter, please call our office to schedule the follow-up appointment.

## 2014-04-02 DIAGNOSIS — J301 Allergic rhinitis due to pollen: Secondary | ICD-10-CM | POA: Diagnosis not present

## 2014-04-02 DIAGNOSIS — J3089 Other allergic rhinitis: Secondary | ICD-10-CM | POA: Diagnosis not present

## 2014-04-02 DIAGNOSIS — J3081 Allergic rhinitis due to animal (cat) (dog) hair and dander: Secondary | ICD-10-CM | POA: Diagnosis not present

## 2014-04-05 ENCOUNTER — Ambulatory Visit (INDEPENDENT_AMBULATORY_CARE_PROVIDER_SITE_OTHER): Payer: Medicare Other | Admitting: Family Medicine

## 2014-04-05 ENCOUNTER — Encounter: Payer: Self-pay | Admitting: Family Medicine

## 2014-04-05 VITALS — BP 116/78 | HR 78 | Temp 97.6°F | Ht 65.0 in | Wt 205.7 lb

## 2014-04-05 DIAGNOSIS — I25118 Atherosclerotic heart disease of native coronary artery with other forms of angina pectoris: Secondary | ICD-10-CM

## 2014-04-05 DIAGNOSIS — J069 Acute upper respiratory infection, unspecified: Secondary | ICD-10-CM | POA: Diagnosis not present

## 2014-04-05 DIAGNOSIS — J4520 Mild intermittent asthma, uncomplicated: Secondary | ICD-10-CM

## 2014-04-05 MED ORDER — BENZONATATE 100 MG PO CAPS: 100.0000 mg | ORAL_CAPSULE | Freq: Two times a day (BID) | ORAL | Status: DC | PRN

## 2014-04-05 NOTE — Progress Notes (Signed)
HPI:  URI: -started: about 2-3 days ago -symptoms:nasal congestion, sore throat, PND, cough -denies:fever, SOB, wheezing, NVD, tooth pain, sinus pain, ear pain  -has tried: nothing -sick contacts/travel/risks: denies flu exposure, tick exposure or or Ebola risks -denies asthma symptoms or need for albuterol  ROS: See pertinent positives and negatives per HPI.  Past Medical History  Diagnosis Date  . Asthma   . CAD (coronary artery disease)     CABG 2001  . Depression   . GERD (gastroesophageal reflux disease)   . Hyperlipidemia   . IBS (irritable bowel syndrome)   . Acute medial meniscal tear   . Hypertension   . Anemia   . Diverticulosis   . Obesity   . Pancreatitis ~ 1980  . Acute sinusitis 12/14/2006    Qualifier: Diagnosis of  By: Arnoldo Morale MD, Balinda Quails   . Prostate cancer 04/2010  . 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 Pneumonia 1980's X 2  . OSA on CPAP   . History of blood transfusion 2001    "related to the bypass OR"  . DJD (degenerative joint disease)   . Arthritis     "middle finger right hand; right knee; neck" (02/06/2014)  . History of gout     Past Surgical History  Procedure Laterality Date  . Thyroidectomy, partial  1980  . Anterior cervical decomp/discectomy fusion  05/26/06  . Knee arthroscopy Right 1992; 04/2008  . Robot assisted laparoscopic radical prostatectomy  04/2010  . Nasal septum surgery Right 2007  . Back surgery    . Laparoscopic cholecystectomy  2007  . Cardiac catheterization  11/20/1999  . Coronary artery bypass graft  11/20/1999    SVG-RI1-RI2, SVG-OM, SVG-dRCA    Family History  Problem Relation Age of Onset  . Heart disease Father   . Heart disease Mother     History   Social History  . Marital Status: Married    Spouse Name: N/A    Number of Children: N/A  . Years of Education: N/A   Occupational History  . retired    Social History Main Topics  . Smoking status: Former Smoker -- 1.00  packs/day for 35 years    Types: Cigarettes    Quit date: 06/16/1992  . Smokeless tobacco: Never Used  . Alcohol Use: 2.4 oz/week    4 Shots of liquor per week     Comment: mixed drink  . Drug Use: No  . Sexual Activity: No   Other Topics Concern  . None   Social History Narrative   Married 42 years in 2015. No kids (mumps at age 35)   Retired from [employer] in [employer] 2x a week   Hobbies: Chief Strategy Officer, movies and tv, staying active     Current outpatient prescriptions:  .  acetaminophen (TYLENOL) 500 MG tablet, Take 1,000 mg by mouth every 6 (six) hours as needed for headache (pain)., Disp: , Rfl:  .  albuterol (PROAIR HFA) 108 (90 BASE) MCG/ACT inhaler, Inhale 1-2 puffs into the lungs every 6 (six) hours as needed for wheezing or shortness of breath., Disp: , Rfl:  .  aspirin EC 81 MG tablet, Take 81 mg by mouth daily., Disp: , Rfl:  .  beclomethasone (QVAR) 80 MCG/ACT inhaler, Inhale 1 puff into the lungs 2 (two) times daily., Disp: , Rfl:  .  Cyanocobalamin (VITAMIN B-12) 500 MCG SUBL, Place 500 mcg under the tongue daily., Disp: , Rfl:  .  desloratadine (CLARINEX) 5 MG tablet, Take 5 mg by mouth at bedtime. , Disp: , Rfl:  .  diazepam (VALIUM) 5 MG tablet, Take 5 mg by mouth at bedtime as needed for anxiety (sleep)., Disp: , Rfl:  .  fluticasone (FLONASE) 50 MCG/ACT nasal spray, Place 1 spray into both nostrils 2 (two) times daily. , Disp: , Rfl:  .  folic acid (FOLVITE) 1 MG tablet, Take 1 mg by mouth daily.  , Disp: , Rfl:  .  gabapentin (NEURONTIN) 100 MG capsule, Take 4 capsules at bedtime., Disp: 120 capsule, Rfl: 1 .  LAC-HYDRIN FIVE 5 % LOTN lotion, APPLY TO AFFECTED AREA(S) TWICE DAILY, Disp: 120 mL, Rfl: 2 .  olmesartan-hydrochlorothiazide (BENICAR HCT) 40-25 MG per tablet, Take 1 tablet by mouth daily with supper. , Disp: , Rfl:  .  omeprazole (PRILOSEC) 20 MG capsule, Take 1 capsule (20 mg total) by mouth daily as needed (acid reflux/heartburn)., Disp: 90  capsule, Rfl: 1 .  ondansetron (ZOFRAN) 8 MG tablet, Take 8 mg by mouth every 8 (eight) hours as needed for nausea or vomiting., Disp: , Rfl:  .  polyethylene glycol (MIRALAX / GLYCOLAX) packet, Take 17 g by mouth daily.  , Disp: , Rfl:  .  Probiotic Product (ALIGN) 4 MG CAPS, Take 4 mg by mouth daily. , Disp: , Rfl:  .  simvastatin (ZOCOR) 20 MG tablet, TAKE ONE TABLET BY MOUTH NIGHTLY AT BEDTIME  (Patient taking differently: TAKE ONE TABLET BY MOUTH NIGHTLY AT BEDTIME), Disp: 30 tablet, Rfl: 2 .  benzonatate (TESSALON) 100 MG capsule, Take 1 capsule (100 mg total) by mouth 2 (two) times daily as needed for cough., Disp: 20 capsule, Rfl: 0  EXAM:  Filed Vitals:   04/05/14 1413  BP: 116/78  Pulse: 78  Temp: 97.6 F (36.4 C)    Body mass index is 34.23 kg/(m^2).  GENERAL: vitals reviewed and listed above, alert, oriented, appears well hydrated and in no acute distress  HEENT: atraumatic, conjunttiva clear, no obvious abnormalities on inspection of external nose and ears, normal appearance of ear canals and TMs, clear nasal congestion, mild post oropharyngeal erythema with PND, no tonsillar edema or exudate, no sinus TTP  NECK: no obvious masses on inspection  LUNGS: clear to auscultation bilaterally, no wheezes, rales or rhonchi, good air movement  CV: HRRR, no peripheral edema  MS: moves all extremities without noticeable abnormality  PSYCH: pleasant and cooperative, no obvious depression or anxiety  ASSESSMENT AND PLAN:  Discussed the following assessment and plan:  Acute upper respiratory infection  Asthma, mild intermittent, uncomplicated  -given HPI and exam findings today, a serious infection or illness is unlikely. We discussed potential etiologies, with VURI being most likely, and advised supportive care and monitoring. We discussed treatment side effects, likely course, antibiotic misuse, transmission, and signs of developing a serious illness. -no breathing  problems, asthma symptoms or findings concerning for lower resp illness on exam but given his hx asthma advised prompt follow up if any symptoms should develop -of course, we advised to return or notify a doctor immediately if symptoms worsen or persist or new concerns arise.    Patient Instructions  INSTRUCTIONS FOR UPPER RESPIRATORY INFECTION:  -plenty of rest and fluids  -nasal saline wash 2-3 times daily (use prepackaged nasal saline or bottled/distilled water if making your own)   -in the winter time, using a humidifier at night is helpful (please follow cleaning instructions)  -if you are taking a cough medication -  use only as directed, may also try a teaspoon of honey to coat the throat and throat lozenges  -for sore throat, salt water gargles can help  -follow up if you have fevers, facial pain, tooth pain, difficulty breathing or are worsening or not getting better in 5-7 days      KIM, HANNAH R.

## 2014-04-05 NOTE — Patient Instructions (Signed)
INSTRUCTIONS FOR UPPER RESPIRATORY INFECTION:  -plenty of rest and fluids  -nasal saline wash 2-3 times daily (use prepackaged nasal saline or bottled/distilled water if making your own)   -in the winter time, using a humidifier at night is helpful (please follow cleaning instructions)  -if you are taking a cough medication - use only as directed, may also try a teaspoon of honey to coat the throat and throat lozenges  -for sore throat, salt water gargles can help  -follow up if you have fevers, facial pain, tooth pain, difficulty breathing or are worsening or not getting better in 5-7 days

## 2014-04-05 NOTE — Progress Notes (Signed)
Pre visit review using our clinic review tool, if applicable. No additional management support is needed unless otherwise documented below in the visit note. 

## 2014-04-08 ENCOUNTER — Other Ambulatory Visit: Payer: Self-pay | Admitting: Internal Medicine

## 2014-04-10 ENCOUNTER — Ambulatory Visit (INDEPENDENT_AMBULATORY_CARE_PROVIDER_SITE_OTHER): Payer: Medicare Other | Admitting: Podiatry

## 2014-04-10 ENCOUNTER — Telehealth: Payer: Self-pay | Admitting: Family Medicine

## 2014-04-10 ENCOUNTER — Encounter: Payer: Self-pay | Admitting: Podiatry

## 2014-04-10 DIAGNOSIS — M792 Neuralgia and neuritis, unspecified: Secondary | ICD-10-CM

## 2014-04-10 DIAGNOSIS — Q828 Other specified congenital malformations of skin: Secondary | ICD-10-CM

## 2014-04-10 NOTE — Telephone Encounter (Signed)
Pls advise.  

## 2014-04-10 NOTE — Telephone Encounter (Signed)
Patient's wife informed

## 2014-04-10 NOTE — Patient Instructions (Signed)
Seen for calluses and nails, and nerve irritation. All debrided. Nerve problem possibly due to pressure from shoes.  Return as needed.

## 2014-04-10 NOTE — Telephone Encounter (Signed)
Usually we advise going back to work after no fever for 24 hours. Can return to activities as he wishes and if feels well enough to do so.

## 2014-04-10 NOTE — Telephone Encounter (Signed)
Patient Name: Joshua [name]  DOB: May 17, 1940    Initial Comment Caller states he saw MD last week for cough and fatigue. Wants to know if he should work out today.   Nurse Assessment  Nurse: Mallie Mussel, RN, Alveta Heimlich Date/Time Eilene Ghazi Time): 04/10/2014 9:12:13 AM  Confirm and document reason for call. If symptomatic, describe symptoms. ---Caller states that he saw the doctor last week for a cough and fatigue. 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 I asked if he felt worse than last week and he replied no. I asked if he felt like he could do his workout and he replied yes. He also said he wouldn't know until he tried. I was hoping he would make his decision from this, but he wanted the doctor's okay to go to work tomorrow also. I called the backline and spoke with Tye Maryland who states she had spoken with him earlier. She will send a note to the doctor to have him call him back. Caller notified. Verbalized understanding.  Has the patient traveled out of the country within the last 30 days? ---Not Applicable  Does the patient require triage? ---No     Guidelines    Guideline Title Affirmed Question Affirmed Notes       Final Disposition User   Clinical Call Mallie Mussel, RN, Alveta Heimlich

## 2014-04-10 NOTE — Telephone Encounter (Signed)
Pt saw dr Maudie Mercury 1/14 w/ upper resp issues.  Pt states he is feeling better and would like to go exercise today and try to go back to work tomorrow.  transferred pt to triage.  However, triage called back, bc they do not feel comfortable giving a pt a yes or no answer. This should come from the doctor.  pls advise if pt can go to exercise and/or can pt return to work on wed?

## 2014-04-10 NOTE — Progress Notes (Signed)
Subjective: 74 year old male presents complaining of a sharp pain at the base of left great toe in first web space.  Also wants to have calluses and nails trimmed.  Objective: Dermatologic: Mild hypertrophic nails x 10. Plantar calluses under the fist MPJ and left great toe plantar medial aspect. Vascular: All pedal pulses are palpable. Neurologic: All epicritic and tactile sensations are grossly intact. Abnormal neuralgic pain over the first web space left foot. Orthopedic: Cavus type foot with plantar callus under the first MPJ left.  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 Keratosis under left plantar surface. Mycotic nails.  Plan:  Reviewed findings. All calluses and nail debrided. May benefit from changing shoe gear.

## 2014-04-11 DIAGNOSIS — J301 Allergic rhinitis due to pollen: Secondary | ICD-10-CM | POA: Diagnosis not present

## 2014-04-11 DIAGNOSIS — J3081 Allergic rhinitis due to animal (cat) (dog) hair and dander: Secondary | ICD-10-CM | POA: Diagnosis not present

## 2014-04-13 ENCOUNTER — Ambulatory Visit: Payer: Medicare Other | Admitting: Neurology

## 2014-04-24 ENCOUNTER — Encounter: Payer: Self-pay | Admitting: Family Medicine

## 2014-04-24 ENCOUNTER — Ambulatory Visit (INDEPENDENT_AMBULATORY_CARE_PROVIDER_SITE_OTHER): Payer: Medicare Other | Admitting: Family Medicine

## 2014-04-24 VITALS — BP 130/72 | Temp 97.9°F | Wt 207.0 lb

## 2014-04-24 DIAGNOSIS — A499 Bacterial infection, unspecified: Secondary | ICD-10-CM | POA: Diagnosis not present

## 2014-04-24 DIAGNOSIS — J329 Chronic sinusitis, unspecified: Secondary | ICD-10-CM | POA: Diagnosis not present

## 2014-04-24 DIAGNOSIS — B9689 Other specified bacterial agents as the cause of diseases classified elsewhere: Secondary | ICD-10-CM

## 2014-04-24 MED ORDER — AMOXICILLIN-POT CLAVULANATE 875-125 MG PO TABS: 1.0000 | ORAL_TABLET | Freq: Two times a day (BID) | ORAL | Status: DC

## 2014-04-24 NOTE — Progress Notes (Signed)
[name], MD Phone: [phone]  Subjective:   Joshua [name] is a 74 y.o. year old very pleasant male patient who presents with the following:  Sinusitis? -Sinus pressure/congestion. Headaches and yellow mucus draining from sinuses. X 3 weeks. Seen 1/14 and treated with afrin primarily for URI. Was getting better through Friday. Started worsening on Saturday and started dayquil and helped some but symptoms gradually worsened through today. He is feeling fatigued, sinus pressure and congestion worsening. No new sick exposures.  ROS- no fever/chills/vomiting/shortness of breath  Past Medical History- asthma on qvar, IBS, CAD with hx CABG, former smoker quit age 28, HTN  Medications- reviewed and updated Current Outpatient Prescriptions  Medication Sig Dispense Refill  . aspirin EC 81 MG tablet Take 81 mg by mouth daily.    . beclomethasone (QVAR) 80 MCG/ACT inhaler Inhale 1 puff into the lungs 2 (two) times daily.    [name] BENICAR HCT 40-25 MG per tablet TAKE ONE TABLET BY MOUTH ONE TIME DAILY  30 tablet 3  . benzonatate (TESSALON) 100 MG capsule Take 1 capsule (100 mg total) by mouth 2 (two) times daily as needed for cough. 20 capsule 0  . Cyanocobalamin (VITAMIN B-12) 500 MCG SUBL Place 500 mcg under the tongue daily.    [name] desloratadine (CLARINEX) 5 MG tablet Take 5 mg by mouth at bedtime.     . folic acid (FOLVITE) 1 MG tablet Take 1 mg by mouth daily.      [name] gabapentin (NEURONTIN) 100 MG capsule Take 4 capsules at bedtime. 120 capsule 1  . olmesartan-hydrochlorothiazide (BENICAR HCT) 40-25 MG per tablet Take 1 tablet by mouth daily with supper.     [name] omeprazole (PRILOSEC) 20 MG capsule Take 1 capsule (20 mg total) by mouth daily as needed (acid reflux/heartburn). 90 capsule 1  . Probiotic Product (ALIGN) 4 MG CAPS Take 4 mg by mouth daily.     . simvastatin (ZOCOR) 20 MG tablet TAKE ONE TABLET BY MOUTH NIGHTLY AT BEDTIME  (Patient taking differently: TAKE ONE TABLET BY MOUTH  NIGHTLY AT BEDTIME) 30 tablet 2  . acetaminophen (TYLENOL) 500 MG tablet Take 1,000 mg by mouth every 6 (six) hours as needed for headache (pain).    [name] albuterol (PROAIR HFA) 108 (90 BASE) MCG/ACT inhaler Inhale 1-2 puffs into the lungs every 6 (six) hours as needed for wheezing or shortness of breath.    . diazepam (VALIUM) 5 MG tablet Take 5 mg by mouth at bedtime as needed for anxiety (sleep).    . fluticasone (FLONASE) 50 MCG/ACT nasal spray Place 1 spray into both nostrils 2 (two) times daily.     [name] LAC-HYDRIN FIVE 5 % LOTN lotion APPLY TO AFFECTED AREA(S) TWICE DAILY (Patient not taking: Reported on 04/24/2014) 120 mL 2  . ondansetron (ZOFRAN) 8 MG tablet Take 8 mg by mouth every 8 (eight) hours as needed for nausea or vomiting.    . polyethylene glycol (MIRALAX / GLYCOLAX) packet Take 17 g by mouth daily.       Objective: BP 130/72 mmHg  Temp(Src) 97.9 F (36.6 C)  Wt 207 lb (93.895 kg) Gen: NAD, resting comfortably Very tender maxillary sinuses, mild tenderness frontal sinuses. Oropharynx normal. Turbinates erythematous with yellow drainage noted.  CV: RRR no murmurs rubs or gallops Lungs: CTAB no crackles, wheeze, rhonchi Skin: warm, dry, no rash  Neuro: grossly normal, moves all extremities   Assessment/Plan:  Bacterial sinusitis Based off 3 weeks of symptoms with improvement then worsening. Start  augmentin x 8 days. Follow up if no improvement on regimen.   Meds ordered this encounter  Medications  . amoxicillin-clavulanate (AUGMENTIN) 875-125 MG per tablet    Sig: Take 1 tablet by mouth 2 (two) times daily.    Dispense:  16 tablet    Refill:  0

## 2014-04-24 NOTE — Patient Instructions (Signed)
Sinus infection (bacterial)  Augmentin for 8 days  Follow up if no improvement or new or worsening symptoms

## 2014-04-30 ENCOUNTER — Telehealth: Payer: Self-pay | Admitting: Family Medicine

## 2014-04-30 ENCOUNTER — Ambulatory Visit: Payer: Medicare Other | Admitting: Family Medicine

## 2014-04-30 NOTE — Telephone Encounter (Signed)
Please advise 

## 2014-04-30 NOTE — Telephone Encounter (Signed)
Let's have patient finish his antibiotics then follow up. This could be a viral cause and that is why antibiotics not helping. Let's wait a full 10 days from starting treatment unless worsening.

## 2014-04-30 NOTE — Telephone Encounter (Signed)
Pt will f/u after antibiotics are finished

## 2014-04-30 NOTE — Telephone Encounter (Signed)
Pt advised to fu in not better.  Pt states there is some improvement, but not 100%. Pt has gained 4 lbs in a week.  Pt states this has been going on 4 weeks. Pt has 2 1/2 days of antibiotic left.  Still has a cough.  Pt states his chest just does feel right, congested. Pt states he is real tired.  Pt made appt today. However, would like to know if you think he needs to wait another few days, or come in. Pt has not returned to work.  Was supposed to work, but took off. Do you want pt to keep appt for this afternoon?

## 2014-05-01 ENCOUNTER — Other Ambulatory Visit: Payer: Self-pay | Admitting: *Deleted

## 2014-05-01 MED ORDER — GABAPENTIN 100 MG PO CAPS: ORAL_CAPSULE | ORAL | Status: DC

## 2014-05-01 NOTE — Telephone Encounter (Signed)
Patient needed refills on this medication  He will run out before his follow up appointment

## 2014-05-01 NOTE — Telephone Encounter (Signed)
error 

## 2014-05-02 DIAGNOSIS — J3089 Other allergic rhinitis: Secondary | ICD-10-CM | POA: Diagnosis not present

## 2014-05-02 DIAGNOSIS — J301 Allergic rhinitis due to pollen: Secondary | ICD-10-CM | POA: Diagnosis not present

## 2014-05-02 DIAGNOSIS — J3081 Allergic rhinitis due to animal (cat) (dog) hair and dander: Secondary | ICD-10-CM | POA: Diagnosis not present

## 2014-05-08 ENCOUNTER — Other Ambulatory Visit: Payer: Self-pay | Admitting: Family Medicine

## 2014-05-09 DIAGNOSIS — J301 Allergic rhinitis due to pollen: Secondary | ICD-10-CM | POA: Diagnosis not present

## 2014-05-09 DIAGNOSIS — J3081 Allergic rhinitis due to animal (cat) (dog) hair and dander: Secondary | ICD-10-CM | POA: Diagnosis not present

## 2014-05-09 DIAGNOSIS — J3089 Other allergic rhinitis: Secondary | ICD-10-CM | POA: Diagnosis not present

## 2014-05-11 ENCOUNTER — Ambulatory Visit: Payer: Medicare Other | Admitting: Family Medicine

## 2014-05-14 DIAGNOSIS — J3081 Allergic rhinitis due to animal (cat) (dog) hair and dander: Secondary | ICD-10-CM | POA: Diagnosis not present

## 2014-05-14 DIAGNOSIS — J3089 Other allergic rhinitis: Secondary | ICD-10-CM | POA: Diagnosis not present

## 2014-05-14 DIAGNOSIS — J301 Allergic rhinitis due to pollen: Secondary | ICD-10-CM | POA: Diagnosis not present

## 2014-05-21 DIAGNOSIS — J301 Allergic rhinitis due to pollen: Secondary | ICD-10-CM | POA: Diagnosis not present

## 2014-05-21 DIAGNOSIS — J3089 Other allergic rhinitis: Secondary | ICD-10-CM | POA: Diagnosis not present

## 2014-05-21 DIAGNOSIS — J3081 Allergic rhinitis due to animal (cat) (dog) hair and dander: Secondary | ICD-10-CM | POA: Diagnosis not present

## 2014-05-24 ENCOUNTER — Encounter: Payer: Self-pay | Admitting: Neurology

## 2014-05-24 ENCOUNTER — Ambulatory Visit: Payer: Medicare Other | Admitting: Family Medicine

## 2014-05-24 ENCOUNTER — Ambulatory Visit (INDEPENDENT_AMBULATORY_CARE_PROVIDER_SITE_OTHER): Payer: Medicare Other | Admitting: Neurology

## 2014-05-24 VITALS — BP 110/72 | HR 80 | Resp 16 | Ht 67.0 in | Wt 205.0 lb

## 2014-05-24 DIAGNOSIS — I25118 Atherosclerotic heart disease of native coronary artery with other forms of angina pectoris: Secondary | ICD-10-CM

## 2014-05-24 DIAGNOSIS — G609 Hereditary and idiopathic neuropathy, unspecified: Secondary | ICD-10-CM

## 2014-05-24 MED ORDER — GABAPENTIN 100 MG PO CAPS: ORAL_CAPSULE | ORAL | Status: DC

## 2014-05-24 NOTE — Progress Notes (Signed)
NEUROLOGY FOLLOW UP OFFICE NOTE  Joshua [name] [id number]  HISTORY OF PRESENT ILLNESS: I had the pleasure of seeing Joshua [name] in follow-up in the neurology clinic on 05/24/2014.  The patient was last seen on 9 months ago for dizziness and neuropathy. On his initial visit, he reports the dizziness had improved with Lexapro discontinuation. He has not had any further problems with dizziness. He is also feeling better with fatigue symptoms since starting CPAP for OSA. On his last visit, he reported burning pain in both feet, as well as in the penile region since prostatectomy. Gabapentin dose was increased to 488m/day. He reports that symptoms have not worsened, they are lessening if anything else. He has weeks where he does not have any pain. On average he would have 3 painful days in a week. Today is a bad day. Exercising seems to increase the pain.  He denies any daytime drowsiness on gabapentin. He denies any headaches, diplopia, focal weakness, neck/back pain, bowel/bladder dysfunction.  HPI: This is a very pleasant 74yo RH man with a history of hypertension, hyperlipidemia, B12 deficiency, and diagnosis of neuropathy, who initially presented with dizziness that started in June 2014. He described the dizziness as gait unsteadiness where he walks like he is drunk ("the wobbles"). He denies any true vertigo or lightheadedness. He feels like he shuffles, and was concerned after he fell off his truck last May due to unsteadiness. He felt his thinking was foggy and out of focus, with difficulty concentrating. He was having these symptoms almost daily, especially when going to the bathroom. He does not feel dizzy when sitting or lying down. He asked for Lexapro to be discontinued because he felt drugged with "terrible anger dreams," and has been off the medication for 2 weeks with no further dizziness.   He has been diagnosed with neuropathy due to burning pain and pins and needles  sensation in both feet that has been ongoing for the past 4-5 years. He started gabapentin in 2014, with some effect on 3061mday but could not function on 60055may. He has had a prostatectomy and has pain in the penile region. He has occasional bladder incontinence, neck and back pain. He reports frostbite in both feet at age [age] Laboratory Data: TSH, B12, ESR, SPEP/IFE normal.  PAST MEDICAL HISTORY: Past Medical History  Diagnosis Date  . Asthma   . CAD (coronary artery disease)     CABG 2001  . Depression   . GERD (gastroesophageal reflux disease)   . Hyperlipidemia   . IBS (irritable bowel syndrome)   . Acute medial meniscal tear   . Hypertension   . Anemia   . Diverticulosis   . Obesity   . Pancreatitis ~ 1980  . Acute sinusitis 12/14/2006    Qualifier: Diagnosis of  By: JenArnoldo Morale, JohBalinda Quails. Prostate cancer 04/2010  . 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 Pneumonia 1980's X 2  . OSA on CPAP   . History of blood transfusion 2001    "related to the bypass OR"  . DJD (degenerative joint disease)   . Arthritis     "middle finger right hand; right knee; neck" (02/06/2014)  . History of gout     MEDICATIONS: Current Outpatient Prescriptions on File Prior to Visit  Medication Sig Dispense Refill  . acetaminophen (TYLENOL) 500 MG tablet Take 1,000 mg by mouth every 6 (six) hours as needed for headache (pain).    .[name]  beclomethasone (QVAR) 80 MCG/ACT inhaler Inhale 1 puff into the lungs 2 (two) times daily.    . Cyanocobalamin (VITAMIN B-12) 500 MCG SUBL Place 500 mcg under the tongue daily.    [name] desloratadine (CLARINEX) 5 MG tablet Take 5 mg by mouth at bedtime.     . fluticasone (FLONASE) 50 MCG/ACT nasal spray Place 1 spray into both nostrils 2 (two) times daily.     . folic acid (FOLVITE) 1 MG tablet Take 1 mg by mouth daily.      [name] gabapentin (NEURONTIN) 100 MG capsule Take 4 capsules at bedtime. (Patient taking differently: Take 1 capsule in the  morning, 1 capsule at dinner, 2 capsules at bedtime) 120 capsule 1  . LAC-HYDRIN FIVE 5 % LOTN lotion APPLY TO AFFECTED AREA(S) TWICE DAILY 120 mL 2  . olmesartan-hydrochlorothiazide (BENICAR HCT) 40-25 MG per tablet Take 1 tablet by mouth daily with supper.     [name] omeprazole (PRILOSEC) 20 MG capsule Take 1 capsule (20 mg total) by mouth daily as needed (acid reflux/heartburn). 90 capsule 1  . polyethylene glycol (MIRALAX / GLYCOLAX) packet Take 17 g by mouth daily.      . Probiotic Product (ALIGN) 4 MG CAPS Take 4 mg by mouth daily.     . simvastatin (ZOCOR) 20 MG tablet TAKE ONE TABLET BY MOUTH AT BEDTIME  30 tablet 2  . albuterol (PROAIR HFA) 108 (90 BASE) MCG/ACT inhaler Inhale 1-2 puffs into the lungs every 6 (six) hours as needed for wheezing or shortness of breath.    [name] aspirin EC 81 MG tablet Take 81 mg by mouth daily.    . diazepam (VALIUM) 5 MG tablet Take 5 mg by mouth at bedtime as needed for anxiety (sleep).    . ondansetron (ZOFRAN) 8 MG tablet Take 8 mg by mouth every 8 (eight) hours as needed for nausea or vomiting.     No current facility-administered medications on file prior to visit.    ALLERGIES: Allergies  Allergen Reactions  . Azithromycin Other (See Comments)    Dizziness and double vision   . 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 Bromfed Nausea And Vomiting  . Clarithromycin Other (See Comments)     gastritis  . Codeine Other (See Comments)    Trouble breathing  . Doxycycline Hyclate Nausea And Vomiting  . Modafinil Other (See Comments)     anxiety-nervousness  . Promethazine Hcl Other (See Comments)     fainting  . Telithromycin Nausea And Vomiting  . Erythromycin Base Rash  . Hydrocodone-Acetaminophen Rash  . Sulfonamide Derivatives Rash    FAMILY HISTORY: Family History  Problem Relation Age of Onset  . Heart disease Father   . Heart disease Mother     SOCIAL HISTORY: History    Social History  . Marital Status: Married    Spouse Name: N/A  . Number of Children: N/A  . Years of Education: N/A   Occupational History  . retired    Social History Main Topics  . Smoking status: Former Smoker -- 1.00 packs/day for 35 years    Types: Cigarettes    Quit date: 06/16/1992  . Smokeless tobacco: Never Used  . Alcohol Use: 2.4 oz/week    4 Shots of liquor per week     Comment: mixed drink  . Drug Use: No  . Sexual Activity: No   Other Topics Concern  . Not on file  Social History Narrative   Married 42 years in 2015. No kids (mumps at age 70)   Retired from [employer] in [employer] 2x a week   Hobbies: Chief Strategy Officer, movies and tv, staying active    REVIEW OF SYSTEMS: Constitutional: No fevers, chills, or sweats, no generalized fatigue, change in appetite Eyes: No visual changes, double vision, eye pain Ear, nose and throat: No hearing loss, ear pain, nasal congestion, sore throat Cardiovascular: No chest pain, palpitations Respiratory:  No shortness of breath at rest or with exertion, wheezes GastrointestinaI: No nausea, vomiting, diarrhea, abdominal pain, fecal incontinence Genitourinary:  No dysuria, urinary retention or frequency Musculoskeletal:  No neck pain, back pain Integumentary: No rash, pruritus, skin lesions Neurological: as above Psychiatric: No depression, insomnia, anxiety Endocrine: No palpitations, fatigue, diaphoresis, mood swings, change in appetite, change in weight, increased thirst Hematologic/Lymphatic:  No anemia, purpura, petechiae. Allergic/Immunologic: no itchy/runny eyes, nasal congestion, recent allergic reactions, rashes  PHYSICAL EXAM: Filed Vitals:   05/24/14 1025  BP: 110/72  Pulse: 80  Resp: 16   General: No acute distress Head:  Normocephalic/atraumatic Neck: supple, no paraspinal tenderness, full range of motion Heart:  Regular rate and rhythm Lungs:  Clear to auscultation bilaterally Back: No  paraspinal tenderness Skin/Extremities: No rash, no edema Neurological Exam: alert and oriented to person, place, and time. No aphasia or dysarthria. Fund of knowledge is appropriate.  Recent and remote memory are intact.  Attention and concentration are normal.    Able to name objects and repeat phrases. Cranial nerves: Pupils equal, round, reactive to light.  Fundoscopic exam unremarkable, no papilledema. Extraocular movements intact with no nystagmus. Visual fields full. Facial sensation intact. No facial asymmetry. Tongue, uvula, palate midline.  Motor: Bulk and tone normal, muscle strength 5/5 throughout with no pronator drift.  Sensation to light touch, temperature on both UE. Reports hyperesthesia to pin on both feet up to ankles, increased cold sensation on both feet. Decreased vibration to left ankle. Intact JPS.No extinction to double simultaneous stimulation. Romberg test negative.  Deep tendon reflexes 2+ throughout, toes downgoing.  Finger to nose testing intact.  Gait narrow-based and steady, able to tandem walk adequately.    IMPRESSION: This is a very pleasant 74 yo RH man with a history of hypertension, hyperlipidemia, who initially presented with dizziness that has resolved since Lexapro stopped. No further dizzy episodes. He has signs and symptoms of length-dependent peripheral neuropathy. Neuropathy labs normal. Gabapentin dose will be increased, he would like to do it gently, and will increase by 158m daily. He will take 2 caps in AM, 1 cap in evening, 2 caps at bedtime. Side effects were discussed. He will 4-5 months.  Thank you for allowing me to participate in his care.  Please do not hesitate to call for any questions or concerns.  The duration of this appointment visit was 15 minutes of face-to-face time with the patient.  Greater than 50% of this time was spent in counseling, explanation of diagnosis, planning of further management, and coordination of care.   KEllouise Newer  M.D.   CC: Dr. HYong Channel

## 2014-05-24 NOTE — Patient Instructions (Signed)
1. Increase gabapentin 100mg : Take 2 caps in AM, 1 cap at dinner, 2 caps at bedtime 2. Call our office for any problems, follow-up in 4-5 months

## 2014-05-31 ENCOUNTER — Encounter: Payer: Self-pay | Admitting: Family Medicine

## 2014-05-31 ENCOUNTER — Ambulatory Visit (INDEPENDENT_AMBULATORY_CARE_PROVIDER_SITE_OTHER): Payer: Medicare Other | Admitting: Family Medicine

## 2014-05-31 VITALS — BP 120/70 | HR 78 | Temp 98.0°F | Wt 203.0 lb

## 2014-05-31 DIAGNOSIS — I10 Essential (primary) hypertension: Secondary | ICD-10-CM

## 2014-05-31 DIAGNOSIS — J309 Allergic rhinitis, unspecified: Secondary | ICD-10-CM

## 2014-05-31 DIAGNOSIS — E785 Hyperlipidemia, unspecified: Secondary | ICD-10-CM

## 2014-05-31 DIAGNOSIS — Z23 Encounter for immunization: Secondary | ICD-10-CM

## 2014-05-31 NOTE — Progress Notes (Signed)
[name], MD Phone: [phone]  Subjective:   Joshua [name] is a 74 y.o. year old very pleasant male patient who presents with the following:  Hyperlipidemia-controlled  Lab Results  Component Value Date   [place] 90 11/09/2013   On statin: simvastatin 20mg  Regular exercise: yes several days a week Has lost 2 lbs ROS- no chest pain or shortness of breath. No myalgias  Hypertension-controlled  BP Readings from Last 3 Encounters:  05/31/14 120/70  05/24/14 110/72  04/24/14 130/72   Home BP monitoring-no Compliant with medications-yes without side effects ROS-Denies any CP, HA, SOB, blurry vision.   Allergic Rhinitis-mild poor control - continues to have mild sinus congestion (history sinus surgery) as well as watery eyes. 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  ROS- denies sinus pressure or purulent drainage  Past Medical History- Patient Active Problem List   Diagnosis Date Noted  . Coronary atherosclerosis 10/12/2006    Priority: High  . Irritable bowel syndrome 10/12/2006    Priority: High  . CKD (chronic kidney disease), stage III 10/23/2013    Priority: Medium  . Neuropathy 08/21/2013    Priority: Medium  . 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 Essential hypertension 02/21/2007    Priority: Medium  . Hyperlipidemia 10/12/2006    Priority: Medium  . DEPRESSION 10/12/2006    Priority: Medium  . Asthma 10/12/2006    Priority: Medium  . Other malaise and fatigue 11/09/2013    Priority: Low  . Dizziness 08/23/2013    Priority: Low  . RLS (restless legs syndrome) 01/03/2013    Priority: Low  . Low back pain radiating to left leg 10/09/2011    Priority: Low  . OSTEOARTHRITIS, LOWER LEG, LEFT 10/04/2009    Priority: Low  . TOBACCO ABUSE, HX OF 12/26/2008    Priority: Low  . ANEMIA, IRON DEFICIENCY 11/06/2008    Priority: Low  . 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 OSA  (obstructive sleep apnea) 11/02/2006    Priority: Low  . GERD 10/12/2006    Priority: Low  . Allergic rhinitis 05/31/2014   Medications- reviewed and updated Current Outpatient Prescriptions  Medication Sig Dispense Refill  . aspirin EC 81 MG tablet Take 81 mg by mouth daily.    . beclomethasone (QVAR) 80 MCG/ACT inhaler Inhale 1 puff into the lungs 2 (two) times daily.    . Cyanocobalamin (VITAMIN B-12) 500 MCG SUBL Place 500 mcg under the tongue daily.    [name] desloratadine (CLARINEX) 5 MG tablet Take 5 mg by mouth at bedtime.     . fluticasone (FLONASE) 50 MCG/ACT nasal spray Place 1 spray into both nostrils 2 (two) times daily.     . folic acid (FOLVITE) 1 MG tablet Take 1 mg by mouth daily.      [name] gabapentin (NEURONTIN) 100 MG capsule Take 2 caps in AM, 1 cap in evening, 2 caps at bedtime 150 capsule 11  . LAC-HYDRIN FIVE 5 % LOTN lotion APPLY TO AFFECTED AREA(S) TWICE DAILY 120 mL 2  . olmesartan-hydrochlorothiazide (BENICAR HCT) 40-25 MG per tablet Take 1 tablet by mouth daily with supper.     [name] omeprazole (PRILOSEC) 20 MG capsule Take 1 capsule (20 mg total) by mouth daily as needed (acid reflux/heartburn). 90 capsule 1  . polyethylene glycol (MIRALAX / GLYCOLAX) packet Take 17 g by mouth daily.      . Probiotic Product (ALIGN) 4 MG CAPS Take  4 mg by mouth daily.     . simvastatin (ZOCOR) 20 MG tablet TAKE ONE TABLET BY MOUTH AT BEDTIME  30 tablet 2  . acetaminophen (TYLENOL) 500 MG tablet Take 1,000 mg by mouth every 6 (six) hours as needed for headache (pain).    [name] albuterol (PROAIR HFA) 108 (90 BASE) MCG/ACT inhaler Inhale 1-2 puffs into the lungs every 6 (six) hours as needed for wheezing or shortness of breath.    . diazepam (VALIUM) 5 MG tablet Take 5 mg by mouth at bedtime as needed for anxiety (sleep).    . ondansetron (ZOFRAN) 8 MG tablet Take 8 mg by mouth every 8 (eight) hours as needed for nausea or vomiting.     No current facility-administered medications for this visit.     Objective: BP 120/70 mmHg  Pulse 78  Temp(Src) 98 F (36.7 C)  Wt 203 lb (92.08 kg) Gen: NAD, resting comfortably CV: RRR no murmurs rubs or gallops Lungs: CTAB no crackles, wheeze, rhonchi Abdomen: soft/nontender/nondistended/normal bowel sounds. No rebound or guarding.  Ext: no edema Skin: warm, dry, no rash   Assessment/Plan:  Hyperlipidemia Controlled on Simvastatin 20mg . Has lost a few pounds-encouraged continued healthy eating, exercise   Essential hypertension Controlled on olmesartan-hctz 40-25. No changes.    Allergic rhinitis Slightly poor control on clarinex and flonase. Could consider neti pot. Advied to continue allergy shots through allergist.    3-6 month follow up.   Orders Placed This Encounter  Procedures  . Pneumococcal conjugate vaccine 13-valent

## 2014-05-31 NOTE — Assessment & Plan Note (Signed)
Slightly poor control on clarinex and flonase. Could consider neti pot. Advied to continue allergy shots through allergist.

## 2014-05-31 NOTE — Assessment & Plan Note (Signed)
Controlled on olmesartan-hctz 40-25. No changes.

## 2014-05-31 NOTE — Assessment & Plan Note (Signed)
Controlled on Simvastatin 20mg . Has lost a few pounds-encouraged continued healthy eating, exercise

## 2014-05-31 NOTE — Patient Instructions (Addendum)
Received pneumonia shot (final)   I think this is the best you have looked in some time! Glad you are finally over that infection but continue with allergy shots  BP and cholesterol are in a great place.   Great job dropping 2 lbs. Continue your efforts, would love to see you under 200.   3-6 month follow up

## 2014-06-04 DIAGNOSIS — J301 Allergic rhinitis due to pollen: Secondary | ICD-10-CM | POA: Diagnosis not present

## 2014-06-04 DIAGNOSIS — J3089 Other allergic rhinitis: Secondary | ICD-10-CM | POA: Diagnosis not present

## 2014-06-04 DIAGNOSIS — J3081 Allergic rhinitis due to animal (cat) (dog) hair and dander: Secondary | ICD-10-CM | POA: Diagnosis not present

## 2014-06-07 DIAGNOSIS — H25011 Cortical age-related cataract, right eye: Secondary | ICD-10-CM | POA: Diagnosis not present

## 2014-06-07 DIAGNOSIS — H2513 Age-related nuclear cataract, bilateral: Secondary | ICD-10-CM | POA: Diagnosis not present

## 2014-06-18 DIAGNOSIS — J3089 Other allergic rhinitis: Secondary | ICD-10-CM | POA: Diagnosis not present

## 2014-06-18 DIAGNOSIS — J3081 Allergic rhinitis due to animal (cat) (dog) hair and dander: Secondary | ICD-10-CM | POA: Diagnosis not present

## 2014-06-18 DIAGNOSIS — J301 Allergic rhinitis due to pollen: Secondary | ICD-10-CM | POA: Diagnosis not present

## 2014-07-02 DIAGNOSIS — J3081 Allergic rhinitis due to animal (cat) (dog) hair and dander: Secondary | ICD-10-CM | POA: Diagnosis not present

## 2014-07-02 DIAGNOSIS — J3089 Other allergic rhinitis: Secondary | ICD-10-CM | POA: Diagnosis not present

## 2014-07-02 DIAGNOSIS — J301 Allergic rhinitis due to pollen: Secondary | ICD-10-CM | POA: Diagnosis not present

## 2014-07-03 ENCOUNTER — Ambulatory Visit: Payer: Medicare Other | Admitting: Family Medicine

## 2014-07-16 DIAGNOSIS — J3089 Other allergic rhinitis: Secondary | ICD-10-CM | POA: Diagnosis not present

## 2014-07-16 DIAGNOSIS — J301 Allergic rhinitis due to pollen: Secondary | ICD-10-CM | POA: Diagnosis not present

## 2014-07-16 DIAGNOSIS — J3081 Allergic rhinitis due to animal (cat) (dog) hair and dander: Secondary | ICD-10-CM | POA: Diagnosis not present

## 2014-07-30 DIAGNOSIS — J3089 Other allergic rhinitis: Secondary | ICD-10-CM | POA: Diagnosis not present

## 2014-07-30 DIAGNOSIS — J3081 Allergic rhinitis due to animal (cat) (dog) hair and dander: Secondary | ICD-10-CM | POA: Diagnosis not present

## 2014-07-30 DIAGNOSIS — J301 Allergic rhinitis due to pollen: Secondary | ICD-10-CM | POA: Diagnosis not present

## 2014-08-06 ENCOUNTER — Other Ambulatory Visit: Payer: Self-pay | Admitting: Family Medicine

## 2014-08-13 DIAGNOSIS — J301 Allergic rhinitis due to pollen: Secondary | ICD-10-CM | POA: Diagnosis not present

## 2014-08-13 DIAGNOSIS — J3089 Other allergic rhinitis: Secondary | ICD-10-CM | POA: Diagnosis not present

## 2014-08-13 DIAGNOSIS — J3081 Allergic rhinitis due to animal (cat) (dog) hair and dander: Secondary | ICD-10-CM | POA: Diagnosis not present

## 2014-08-16 ENCOUNTER — Encounter: Payer: Self-pay | Admitting: Family Medicine

## 2014-08-16 ENCOUNTER — Other Ambulatory Visit: Payer: Self-pay | Admitting: Family Medicine

## 2014-08-16 ENCOUNTER — Ambulatory Visit (INDEPENDENT_AMBULATORY_CARE_PROVIDER_SITE_OTHER): Payer: Medicare Other | Admitting: Family Medicine

## 2014-08-16 ENCOUNTER — Telehealth: Payer: Self-pay | Admitting: Family Medicine

## 2014-08-16 VITALS — BP 124/68 | Temp 97.6°F | Ht 67.0 in | Wt 202.6 lb

## 2014-08-16 DIAGNOSIS — I25118 Atherosclerotic heart disease of native coronary artery with other forms of angina pectoris: Secondary | ICD-10-CM

## 2014-08-16 DIAGNOSIS — H8112 Benign paroxysmal vertigo, left ear: Secondary | ICD-10-CM

## 2014-08-16 MED ORDER — MECLIZINE HCL 25 MG PO TABS
25.0000 mg | ORAL_TABLET | Freq: Three times a day (TID) | ORAL | Status: DC | PRN
Start: 2014-08-16 — End: 2014-08-16

## 2014-08-16 NOTE — Telephone Encounter (Signed)
I noticed he has used valium? This is also used to treat vertigo and he could take 2.5-5 mg q 12 hours if needed for the vertigo. Would advise he try to use as little as possible. If he is out of this and needs rx please rx 5mg , #10, prn q12 hours, no refills.

## 2014-08-16 NOTE — Patient Instructions (Addendum)
Please see the therapist for treatment  Can use the meclizine for symptoms as instructed  Seek emergency care if new symptoms   See neurologist if persists despite treatment  No driving until vertigo resolved  Benign Positional Vertigo Vertigo means you feel like you or your surroundings are moving when they are not. Benign positional vertigo is the most common form of vertigo. Benign means that the cause of your condition is not serious. Benign positional vertigo is more common in older adults. CAUSES  Benign positional vertigo is the result of an upset in the labyrinth system. This is an area in the middle ear that helps control your balance. This may be caused by a viral infection, head injury, or repetitive motion. However, often no specific cause is found. SYMPTOMS  Symptoms of benign positional vertigo occur when you move your head or eyes in different directions. Some of the symptoms may include:  Loss of balance and falls.  Vomiting.  Blurred vision.  Dizziness.  Nausea.  Involuntary eye movements (nystagmus). DIAGNOSIS  Benign positional vertigo is usually diagnosed by physical exam. If the specific cause of your benign positional vertigo is unknown, your caregiver may perform imaging tests, such as magnetic resonance imaging (MRI) or computed tomography (CT). TREATMENT  Your caregiver may recommend movements or procedures to correct the benign positional vertigo. Medicines such as meclizine, benzodiazepines, and medicines for nausea may be used to treat your symptoms. In rare cases, if your symptoms are caused by certain conditions that affect the inner ear, you may need surgery. HOME CARE INSTRUCTIONS   Follow your caregiver's instructions.  Move slowly. Do not make sudden body or head movements.  Avoid driving.  Avoid operating heavy machinery.  Avoid performing any tasks that would be dangerous to you or others during a vertigo episode.  Drink enough fluids to  keep your urine clear or pale yellow. SEEK IMMEDIATE MEDICAL CARE IF:   You develop problems with walking, weakness, numbness, or using your arms, hands, or legs.  You have difficulty speaking.  You develop severe headaches.  Your nausea or vomiting continues or gets worse.  You develop visual changes.  Your family or friends notice any behavioral changes.  Your condition gets worse.  You have a fever.  You develop a stiff neck or sensitivity to light. MAKE SURE YOU:   Understand these instructions.  Will watch your condition.  Will get help right away if you are not doing well or get worse. Document Released: 12/15/2005 Document Revised: 06/01/2011 Document Reviewed: 11/27/2010 [facility] Patient Information 2015 [place], [place]. This information is not intended to replace advice given to you by your health care provider. Make sure you discuss any questions you have with your health care provider.

## 2014-08-16 NOTE — Progress Notes (Signed)
Pre visit review using our clinic review tool, if applicable. No additional management support is needed unless otherwise documented below in the visit note.  HPI:  Joshua [name] is a 74 yo pat of Dr. Yong Channel here for an acute visit for:  Dizziness: -reports: vertigo started 2 days ago, occurs with sudden movements of the head to the left - experiences room spinning and nausea -denies: CP, SOB, HA, vision changes, speech changes, fevers, pus out of nose, facial pain, weakness, numbness, palpitations -chronic sinus issues -on ROC has actually had dizziness in the past and was referred to neurology for eval - thought possibly a side effect to an SSRI -on ROC at his most recent neurology appt 05/2014 his gabapentin was increased for length dependent peripheral neuropathy  ROS: See pertinent positives and negatives per HPI.  Past Medical History  Diagnosis Date  . Asthma   . CAD (coronary artery disease)     CABG 2001  . Depression   . GERD (gastroesophageal reflux disease)   . Hyperlipidemia   . IBS (irritable bowel syndrome)   . Acute medial meniscal tear   . Hypertension   . Anemia   . Diverticulosis   . Obesity   . Pancreatitis ~ 1980  . Acute sinusitis 12/14/2006    Qualifier: Diagnosis of  By: Arnoldo Morale MD, Balinda Quails   . Prostate cancer 04/2010  . 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 Pneumonia 1980's X 2  . OSA on CPAP   . History of blood transfusion 2001    "related to the bypass OR"  . DJD (degenerative joint disease)   . Arthritis     "middle finger right hand; right knee; neck" (02/06/2014)  . History of gout     Past Surgical History  Procedure Laterality Date  . Thyroidectomy, partial  1980  . Anterior cervical decomp/discectomy fusion  05/26/06  . Knee arthroscopy Right 1992; 04/2008  . Robot assisted laparoscopic radical prostatectomy  04/2010  . Nasal septum surgery Right 2007  . Back surgery    . Laparoscopic cholecystectomy  2007  .  Cardiac catheterization  11/20/1999  . Coronary artery bypass graft  11/20/1999    SVG-RI1-RI2, SVG-OM, SVG-dRCA    Family History  Problem Relation Age of Onset  . Heart disease Father   . Heart disease Mother     History   Social History  . Marital Status: Married    Spouse Name: N/A  . Number of Children: N/A  . Years of Education: N/A   Occupational History  . retired    Social History Main Topics  . Smoking status: Former Smoker -- 1.00 packs/day for 35 years    Types: Cigarettes    Quit date: 06/16/1992  . Smokeless tobacco: Never Used  . Alcohol Use: 2.4 oz/week    4 Shots of liquor per week     Comment: mixed drink  . Drug Use: No  . Sexual Activity: No   Other Topics Concern  . None   Social History Narrative   Married 42 years in 2015. No kids (mumps at age 55)   Retired from [employer] in [employer] 2x a week   Hobbies: Chief Strategy Officer, movies and tv, staying active     Current outpatient prescriptions:  .  acetaminophen (TYLENOL) 500 MG tablet, Take 1,000 mg by mouth every 6 (six) hours as needed for headache (pain)., Disp: , Rfl:  .  aspirin EC 81  MG tablet, Take 81 mg by mouth daily., Disp: , Rfl:  .  beclomethasone (QVAR) 80 MCG/ACT inhaler, Inhale 1 puff into the lungs 2 (two) times daily., Disp: , Rfl:  .  Cyanocobalamin (VITAMIN B-12) 500 MCG SUBL, Place 500 mcg under the tongue daily., Disp: , Rfl:  .  desloratadine (CLARINEX) 5 MG tablet, Take 5 mg by mouth at bedtime. , Disp: , Rfl:  .  diazepam (VALIUM) 5 MG tablet, Take 5 mg by mouth at bedtime as needed for anxiety (sleep)., Disp: , Rfl:  .  FLOVENT HFA 110 MCG/ACT inhaler, Inhale 2 puffs into the lungs 2 (two) times daily., Disp: , Rfl: 5 .  fluticasone (FLONASE) 50 MCG/ACT nasal spray, Place 1 spray into both nostrils 2 (two) times daily. , Disp: , Rfl:  .  folic acid (FOLVITE) 1 MG tablet, Take 1 mg by mouth daily.  , Disp: , Rfl:  .  gabapentin (NEURONTIN) 100 MG capsule, Take 2 caps in  AM, 1 cap in evening, 2 caps at bedtime, Disp: 150 capsule, Rfl: 11 .  LAC-HYDRIN FIVE 5 % LOTN lotion, APPLY TO AFFECTED AREA(S) TWICE DAILY, Disp: 120 mL, Rfl: 2 .  olmesartan-hydrochlorothiazide (BENICAR HCT) 40-25 MG per tablet, Take 1 tablet by mouth daily with supper. , Disp: , Rfl:  .  omeprazole (PRILOSEC) 20 MG capsule, Take 1 capsule (20 mg total) by mouth daily as needed (acid reflux/heartburn)., Disp: 90 capsule, Rfl: 1 .  ondansetron (ZOFRAN) 8 MG tablet, Take 8 mg by mouth every 8 (eight) hours as needed for nausea or vomiting., Disp: , Rfl:  .  polyethylene glycol (MIRALAX / GLYCOLAX) packet, Take 17 g by mouth daily.  , Disp: , Rfl:  .  Probiotic Product (ALIGN) 4 MG CAPS, Take 4 mg by mouth daily. , Disp: , Rfl:  .  simvastatin (ZOCOR) 20 MG tablet, TAKE ONE TABLET BY MOUTH AT BEDTIME, Disp: 30 tablet, Rfl: 5 .  albuterol (PROAIR HFA) 108 (90 BASE) MCG/ACT inhaler, Inhale 1-2 puffs into the lungs every 6 (six) hours as needed for wheezing or shortness of breath., Disp: , Rfl:   EXAM:  Filed Vitals:   08/16/14 1323  BP: 124/68  Temp: 97.6 F (36.4 C)    Body mass index is 31.72 kg/(m^2).  GENERAL: vitals reviewed and listed above, alert, oriented, appears well hydrated and in no acute distress  HEENT: atraumatic, conjunttiva clear, no obvious abnormalities on inspection of external nose and ears, normal appearance of ear canals and TMs, clear nasal congestion, mild post oropharyngeal erythema with PND, no tonsillar edema or exudate, no sinus TTP  NECK: no obvious masses on inspection, no meningeal signs, no carotid bruit  LUNGS: clear to auscultation bilaterally, no wheezes, rales or rhonchi, good air movement  CV: HRRR, no peripheral edema  MS: moves all extremities without noticeable abnormality  PSYCH: pleasant and cooperative, no obvious depression or anxiety  NEURO: CN II-XII grossly intact, finger to nose normal, gait normal, speech normal, thought processing  seems normal, dix hallpike + to L with reproductions of symptoms and rotational nystagmus  ASSESSMENT AND PLAN:  Discussed the following assessment and plan:  Benign paroxysmal positional vertigo, left - Plan: Ambulatory referral to Physical Therapy  -we discussed possible serious and likely etiologies, workup and treatment, treatment risks and return precautions with positional vertigo most likely -after this discussion, Eirik opted for meclizine and vestibular rehab -follow up advised if worsens or persists, we discussed that rarely these can be  a symptoms of a serious pathology such as stroke or mass or other and that brain imaging would be needed to eval for these, he opted to hold off on this for now. ED precautions and follow up with his neurologist if not resolving with treatment advised. -of course, we advised Jaman  to return or notify a doctor immediately if symptoms worsen or persist or new concerns arise.  -Patient advised to return or notify a doctor immediately if symptoms worsen or persist or new concerns arise.  There are no Patient Instructions on file for this visit.   Colin Benton R.

## 2014-08-16 NOTE — Telephone Encounter (Signed)
Pt has rx for meclizine and he was told   that he can not take antihistamine. Please advise. Pt saw dr Maudie Mercury today

## 2014-08-17 NOTE — Telephone Encounter (Signed)
I don't recommend any other medications for the vertigo - the treatment is the vestibular rehab - the medications are for the symptoms if needed.

## 2014-08-17 NOTE — Telephone Encounter (Signed)
Pt only takes diazepam for sleep and its not that often.  He stated he would prefer not to take that for vertigo.  Please advise

## 2014-08-17 NOTE — Telephone Encounter (Signed)
Left message for pt to call back  °

## 2014-08-21 NOTE — Telephone Encounter (Signed)
Patient informed of the message below and states he has an appt with rehab tomorrow.

## 2014-08-22 ENCOUNTER — Ambulatory Visit: Payer: Medicare Other | Attending: Family Medicine | Admitting: Rehabilitative and Restorative Service Providers"

## 2014-08-22 DIAGNOSIS — H8112 Benign paroxysmal vertigo, left ear: Secondary | ICD-10-CM | POA: Diagnosis not present

## 2014-08-22 DIAGNOSIS — R269 Unspecified abnormalities of gait and mobility: Secondary | ICD-10-CM | POA: Insufficient documentation

## 2014-08-22 NOTE — Patient Instructions (Signed)
Tip Card 1.The goal of habituation training is to assist in decreasing symptoms of vertigo, dizziness, or nausea provoked by specific head and body motions. 2.These exercises may initially increase symptoms; however, be persistent and work through symptoms. With repetition and time, the exercises will assist in reducing or eliminating symptoms. 3.Exercises should be stopped and discussed with the therapist if you experience any of the following: - Sudden change or fluctuation in hearing - New onset of ringing in the ears, or increase in current intensity - Any fluid discharge from the ear - Severe pain in neck or back - Extreme nausea  Copyright  VHI. All rights reserved.  Rolling   With pillow under head, start on back. Roll to your right side.  Hold until dizziness stops, plus 20 seconds and then roll to the left side.  Hold until dizziness stops, plus 20 seconds.  Repeat sequence 5 times per session. Do 2 sessions per day.  Copyright  VHI. All rights reserved.  Feet Together, Head Motion - Eyes Open   With eyes open, feet together, move head slowly: side to side. Repeat _10___ times per session. Do _2___ sessions per day.  Copyright  VHI. All rights reserved.

## 2014-08-22 NOTE — Therapy (Signed)
[facility] [address] [place], [place], [address] Phone: [phone]   Fax:  [phone]  Physical Therapy Evaluation  Patient Details  Name: Joshua [name] MRN: [id number] Date of Birth: Jun 02, 1940 Referring Provider:  Lucretia Kern, DO  Encounter Date: 08/22/2014      PT End of Session - 08/22/14 0953    Visit Number 1   Number of Visits 4   Date for PT Re-Evaluation 09/21/14   Authorization Type G code every 10th visit   PT Start Time 0852   PT Stop Time 0933   PT Time Calculation (min) 41 min   Activity Tolerance Patient tolerated treatment well   Behavior During Therapy [facility] for tasks assessed/performed      Past Medical History  Diagnosis Date  . Asthma   . CAD (coronary artery disease)     CABG 2001  . Depression   . GERD (gastroesophageal reflux disease)   . Hyperlipidemia   . IBS (irritable bowel syndrome)   . Acute medial meniscal tear   . Hypertension   . Anemia   . Diverticulosis   . Obesity   . Pancreatitis ~ 1980  . Acute sinusitis 12/14/2006    Qualifier: Diagnosis of  By: Arnoldo Morale MD, Balinda Quails   . Prostate cancer 04/2010  . 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 Pneumonia 1980's X 2  . OSA on CPAP   . History of blood transfusion 2001    "related to the bypass OR"  . DJD (degenerative joint disease)   . Arthritis     "middle finger right hand; right knee; neck" (02/06/2014)  . History of gout     Past Surgical History  Procedure Laterality Date  . Thyroidectomy, partial  1980  . Anterior cervical decomp/discectomy fusion  05/26/06  . Knee arthroscopy Right 1992; 04/2008  . Robot assisted laparoscopic radical prostatectomy  04/2010  . Nasal septum surgery Right 2007  . Back surgery    . Laparoscopic cholecystectomy  2007  . Cardiac catheterization  11/20/1999  . Coronary artery bypass graft  11/20/1999    SVG-RI1-RI2, SVG-OM, SVG-dRCA    There were no  vitals filed for this visit.  Visit Diagnosis:  BPPV (benign paroxysmal positional vertigo), left  Abnormality of gait      Subjective Assessment - 08/22/14 0854    Subjective The patient reports woke up last week with sensation of room spinning that lasted for 30 minutes, accompanied by imbalance.  Symptoms improved and then second day symptoms lasted longer upon waking and he was seen by his MD.  He reports symptoms have improved significantly since onset last week.   He also reports staggering for months.  He feels that he still has episodes of wobbling.  He has been worked up for [employer] and has seen neurology without any diagnosis of deficits.   Pertinent History Prior neurologic work-up in the past due to imbalance and nerve pain.   Currently in Pain? Yes   Pain Score 7    Pain Location Foot  from neuropathy, penile pain after prostate surgery   Pain Descriptors / Indicators Constant   Pain Type Chronic pain   Pain Onset More than a month ago   Pain Frequency Constant   Pain Relieving Factors medications improve, but do not resolve            [facility] PT Assessment - 08/22/14 0901    Assessment  Medical Diagnosis --  vertigo   Onset Date/Surgical Date --  07/2014   Balance Screen   Has the patient fallen in the past 6 months Yes   How many times? 1  fell from toilet seat due to dizziness   Has the patient had a decrease in activity level because of a fear of falling?  No   Is the patient reluctant to leave their home because of a fear of falling?  No   Home Environment   Living Environment Private residence   Living Arrangements Spouse/significant other   Type of [place] to enter   Entrance Stairs-Number of Steps 1   [place] Two level   Additional Comments uses rail for safety in home on steps   Observation/Other Assessments   Focus on Therapeutic Outcomes (FOTO)  73%   Transfers   Transfers Sit to Stand   Sit to Stand 7:  Independent   Ambulation/Gait   Ambulation/Gait Yes   Ambulation/Gait Assistance 7: Independent   Ambulation Distance (Feet) --  200    Gait Pattern Within Functional Limits  staggers with dynamic acitivities including head movements    Ambulation Surface Level   Gait velocity 3.07 ft/sec   Stairs Yes   Stairs Assistance 6: Modified independent (Device/Increase time)   Stair Management Technique One rail Right;Alternating pattern   Number of Stairs --  4   Standardized Balance Assessment   Standardized Balance Assessment Berg Balance Test;Dynamic Gait Index   Berg Balance Test   Sit to Stand Able to stand without using hands and stabilize independently   Standing Unsupported Able to stand safely 2 minutes   Sitting with Back Unsupported but Feet Supported on Floor or Stool Able to sit safely and securely 2 minutes   Stand to Sit Sits safely with minimal use of hands   Transfers Able to transfer safely, minor use of hands   Standing Unsupported with Eyes Closed Able to stand 10 seconds safely   Standing Ubsupported with Feet Together Able to place feet together independently and stand 1 minute safely  also tolerates eyes closed with feet together x 30 seconds w   From Standing, Reach Forward with Outstretched Arm Can reach confidently >25 cm (10")   From Standing Position, Pick up Object from Floor Able to pick up shoe safely and easily   From Standing Position, Turn to Look Behind Over each Shoulder Looks behind from both sides and weight shifts well   Turn 360 Degrees Needs close supervision or verbal cueing  dizziness and imbalance to the L side with 360 degree turn   Standing Unsupported, Alternately Place Feet on Step/Stool Able to stand independently and safely and complete 8 steps in 20 seconds   Standing Unsupported, One Foot in Front Able to place foot tandem independently and hold 30 seconds   Standing on One Leg Able to lift leg independently and hold > 10 seconds   Total  Score 53   Dynamic Gait Index   Level Surface Normal   Change in Gait Speed Normal   Gait with Horizontal Head Turns Moderate Impairment   Gait with Vertical Head Turns Mild Impairment   Gait and Pivot Turn Normal   Steps Mild Impairment    full DGI not completed, just the above components        Vestibular Assessment - 08/22/14 0902    Symptom Behavior   Type of Dizziness Spinning   Frequency of Dizziness --  daily   Duration of Dizziness --  intermittent x minutes   Aggravating Factors Mornings;Turning head quickly  and when overly fatigued it is worse   Relieving Factors Lying supine   Occulomotor Exam   Occulomotor Alignment Normal   Spontaneous Absent   Gaze-induced Absent   Smooth Pursuits Intact   Vestibulo-Occular Reflex   VOR 1 Head Only (x 1 viewing) --  provokes 2/10 symptoms with difficulty maintaining gaze   Positional Testing   Dix-Hallpike Dix-Hallpike Right;Dix-Hallpike Left   Sidelying Test Sidelying Right;Sidelying Left   Horizontal Canal Testing Horizontal Canal Right;Horizontal Canal Left   Dix-Hallpike Right   Dix-Hallpike Right Symptoms No nystagmus   Dix-Hallpike Left   Dix-Hallpike Left Symptoms No nystagmus   Sidelying Right   Sidelying Right Symptoms No nystagmus   Sidelying Left   Sidelying Left Symptoms No nystagmus   Horizontal Canal Right   Horizontal Canal Right Symptoms Normal   Horizontal Canal Left   Horizontal Canal Left Symptoms Normal  patient reports 2/10 symptoms      NEUROMUSCULAR RE-EDUCATION: HEP provided as below in pt education.       PT Education - 08/22/14 [phone]    Education provided Yes   Education Details HEP: corner standing feet together + head turns, horizontal roll for habituation   Person(s) Educated Patient   Methods Explanation;Demonstration;Handout   Comprehension Returned demonstration;Verbalized understanding             PT Long Term Goals - 08/22/14 0953    PT LONG TERM GOAL #1   Title  The patient will be indep with HEP for habituation, gaze x 1 viewing and high level balance activities.   Baseline Target date 09/21/2014   Time 4   Period Weeks   PT LONG TERM GOAL #2   Title The patient will subjectively report 0/10 symptoms of dizziness with horizontal rolling (improved from 2/10).   Baseline Target date 09/21/2014   Time 4   Period Weeks   PT LONG TERM GOAL #3   Title The patient will complete 360 degree turn safety to L and R sides wihtout loss of balance.   Baseline Target date 09/21/2014   Time 4   Period Weeks   PT LONG TERM GOAL #4   Title The patient will return to prior wellness activities at gym and with walking program.   Baseline Target date 09/21/2014   Time 4   Period Weeks               Plan - 08/22/14 0955    Clinical Impression Statement The patient is a 74 yo male with onset of vertigo last week.  His symptoms have improved significantly since onset and at today's visit he c/o mild sensation of dizziness without nystagmus noted with L horizontal rolling and during gait with horizontal and vertical head turns.  Gaze x 1 also provokes symptoms.  PT to address remaining vestibular deficits and also provide home exercises for balance as patient c/o worsening balance x past 2 years.   Pt will benefit from skilled therapeutic intervention in order to improve on the following deficits Abnormal gait;Decreased balance;Other (comment);Decreased mobility  vertigo   Rehab Potential Good   PT Frequency 1x / week   PT Duration 4 weeks   PT Treatment/Interventions Vestibular;Functional mobility training;Canalith Repostioning;Gait training;Therapeutic exercise;Balance training;Neuromuscular re-education;Patient/family education   PT Next Visit Plan Check HEP, recheck for BPPV as needed, provide gaze x 1 viewing exercise + dynamic gait with head turns  for HEP   Consulted and Agree with Plan of Care Patient;Family member/caregiver   Family Member Consulted spouse           G-Codes - 09-02-2014 6803    Functional Assessment Tool Used Berg=53/56   Functional Limitation Mobility: Walking and moving around   Mobility: Walking and Moving Around Current Status [phone]) At least 1 percent but less than 20 percent impaired, limited or restricted   Mobility: Walking and Moving Around Goal Status [phone]) At least 1 percent but less than 20 percent impaired, limited or restricted       Problem List Patient Active Problem List   Diagnosis Date Noted  . Allergic rhinitis 05/31/2014  . Other malaise and fatigue 11/09/2013  . CKD (chronic kidney disease), stage III 10/23/2013  . Dizziness 08/23/2013  . Neuropathy September 01, 2013  . RLS (restless legs syndrome) 01/03/2013  . Low back pain radiating to left leg 10/09/2011  . ADENOCARCINOMA, PROSTATE, GLEASON GRADE 6 04/01/2010  . OSTEOARTHRITIS, LOWER LEG, LEFT 10/04/2009  . TOBACCO ABUSE, HX OF 12/26/2008  . ANEMIA, IRON DEFICIENCY 11/06/2008  . 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 Essential hypertension 02/21/2007  . OSA (obstructive sleep apnea) 11/02/2006  . Hyperlipidemia 10/12/2006  . DEPRESSION 10/12/2006  . Coronary atherosclerosis 10/12/2006  . Asthma 10/12/2006  . GERD 10/12/2006  . Irritable bowel syndrome 10/12/2006    Thank you for the referral of this patient.  [place], [place] September 02, 2014, 9:59 AM  [facility] [address] [place], [place], [address] Phone: [phone]   Fax:  [phone]

## 2014-08-25 ENCOUNTER — Other Ambulatory Visit: Payer: Self-pay | Admitting: Family Medicine

## 2014-08-28 ENCOUNTER — Ambulatory Visit (INDEPENDENT_AMBULATORY_CARE_PROVIDER_SITE_OTHER): Payer: Medicare Other | Admitting: Pulmonary Disease

## 2014-08-28 ENCOUNTER — Encounter: Payer: Self-pay | Admitting: Pulmonary Disease

## 2014-08-28 VITALS — BP 122/66 | HR 76 | Temp 97.0°F | Ht 67.0 in | Wt 206.0 lb

## 2014-08-28 DIAGNOSIS — G4733 Obstructive sleep apnea (adult) (pediatric): Secondary | ICD-10-CM | POA: Diagnosis not present

## 2014-08-28 DIAGNOSIS — I25118 Atherosclerotic heart disease of native coronary artery with other forms of angina pectoris: Secondary | ICD-10-CM | POA: Diagnosis not present

## 2014-08-28 NOTE — Assessment & Plan Note (Signed)
The patient feels that he is doing very well with his C Pap device, and his download shows excellent control of his AHI with no significant mask leak. He believes that it has clearly helped his sleep and daytime alertness.  I've asked him to keep up with his supplies, and to work aggressively on weight loss.

## 2014-08-28 NOTE — Patient Instructions (Signed)
Continue on cpap, and keep up with mask cushion changes and supplies. Work on weight loss followup with Dr. Halford Chessman in one year.

## 2014-08-28 NOTE — Progress Notes (Signed)
   Subjective:    Patient ID: Joshua [name], male    DOB: [birth year], 74 y.o.   MRN: [id number]  HPI Patient comes in today for follow-up of his obstructive sleep apnea. He is wearing C Pap compliantly by his download, with good control of his AHI. He feels that he sleeps well with the device, and has improved his daytime alertness. Of note, his weight is stable from the last visit.   Review of Systems  Constitutional: Negative for fever and unexpected weight change.  HENT: Negative for congestion, dental problem, ear pain, nosebleeds, postnasal drip, rhinorrhea, sinus pressure, sneezing, sore throat and trouble swallowing.   Eyes: Negative for redness and itching.  Respiratory: Negative for cough, chest tightness, shortness of breath and wheezing.   Cardiovascular: Negative for palpitations and leg swelling.  Gastrointestinal: Negative for nausea and vomiting.  Genitourinary: Negative for dysuria.  Musculoskeletal: Negative for joint swelling.  Skin: Negative for rash.  Neurological: Negative for headaches.  Hematological: Does not bruise/bleed easily.  Psychiatric/Behavioral: Negative for dysphoric mood. The patient is not nervous/anxious.        Objective:   Physical Exam Overweight male in no acute distress Nose without purulence or discharge noted Neck without lymphadenopathy or thyromegaly No skin breakdown or pressure necrosis from the C Pap mask Lower extremities with mild edema, no cyanosis Alert and oriented, moves all 4 extremities.       Assessment & Plan:

## 2014-08-29 ENCOUNTER — Ambulatory Visit: Payer: Medicare Other | Admitting: Rehabilitative and Restorative Service Providers"

## 2014-08-29 ENCOUNTER — Emergency Department (HOSPITAL_BASED_OUTPATIENT_CLINIC_OR_DEPARTMENT_OTHER): Payer: Medicare Other

## 2014-08-29 ENCOUNTER — Emergency Department (HOSPITAL_BASED_OUTPATIENT_CLINIC_OR_DEPARTMENT_OTHER)
Admission: EM | Admit: 2014-08-29 | Discharge: 2014-08-29 | Disposition: A | Discharge status: Home / Self Care | Payer: Medicare Other | Attending: Emergency Medicine | Admitting: Emergency Medicine

## 2014-08-29 ENCOUNTER — Encounter (HOSPITAL_BASED_OUTPATIENT_CLINIC_OR_DEPARTMENT_OTHER): Payer: Self-pay | Admitting: *Deleted

## 2014-08-29 DIAGNOSIS — M1990 Unspecified osteoarthritis, unspecified site: Secondary | ICD-10-CM | POA: Diagnosis not present

## 2014-08-29 DIAGNOSIS — I10 Essential (primary) hypertension: Secondary | ICD-10-CM | POA: Diagnosis not present

## 2014-08-29 DIAGNOSIS — Z7951 Long term (current) use of inhaled steroids: Secondary | ICD-10-CM | POA: Diagnosis not present

## 2014-08-29 DIAGNOSIS — D649 Anemia, unspecified: Secondary | ICD-10-CM | POA: Insufficient documentation

## 2014-08-29 DIAGNOSIS — I2510 Atherosclerotic heart disease of native coronary artery without angina pectoris: Secondary | ICD-10-CM | POA: Insufficient documentation

## 2014-08-29 DIAGNOSIS — Z79899 Other long term (current) drug therapy: Secondary | ICD-10-CM | POA: Insufficient documentation

## 2014-08-29 DIAGNOSIS — Z87828 Personal history of other (healed) physical injury and trauma: Secondary | ICD-10-CM | POA: Insufficient documentation

## 2014-08-29 DIAGNOSIS — Z8546 Personal history of malignant neoplasm of prostate: Secondary | ICD-10-CM | POA: Insufficient documentation

## 2014-08-29 DIAGNOSIS — Z8701 Personal history of pneumonia (recurrent): Secondary | ICD-10-CM | POA: Diagnosis not present

## 2014-08-29 DIAGNOSIS — H8112 Benign paroxysmal vertigo, left ear: Secondary | ICD-10-CM

## 2014-08-29 DIAGNOSIS — E669 Obesity, unspecified: Secondary | ICD-10-CM | POA: Insufficient documentation

## 2014-08-29 DIAGNOSIS — R110 Nausea: Secondary | ICD-10-CM | POA: Diagnosis not present

## 2014-08-29 DIAGNOSIS — J45909 Unspecified asthma, uncomplicated: Secondary | ICD-10-CM | POA: Insufficient documentation

## 2014-08-29 DIAGNOSIS — Z951 Presence of aortocoronary bypass graft: Secondary | ICD-10-CM | POA: Diagnosis not present

## 2014-08-29 DIAGNOSIS — R1084 Generalized abdominal pain: Secondary | ICD-10-CM | POA: Diagnosis not present

## 2014-08-29 DIAGNOSIS — R269 Unspecified abnormalities of gait and mobility: Secondary | ICD-10-CM | POA: Diagnosis not present

## 2014-08-29 DIAGNOSIS — E785 Hyperlipidemia, unspecified: Secondary | ICD-10-CM | POA: Insufficient documentation

## 2014-08-29 DIAGNOSIS — Z9981 Dependence on supplemental oxygen: Secondary | ICD-10-CM | POA: Insufficient documentation

## 2014-08-29 DIAGNOSIS — K219 Gastro-esophageal reflux disease without esophagitis: Secondary | ICD-10-CM | POA: Insufficient documentation

## 2014-08-29 DIAGNOSIS — Z8551 Personal history of malignant neoplasm of bladder: Secondary | ICD-10-CM | POA: Insufficient documentation

## 2014-08-29 DIAGNOSIS — K5900 Constipation, unspecified: Secondary | ICD-10-CM | POA: Diagnosis not present

## 2014-08-29 DIAGNOSIS — R109 Unspecified abdominal pain: Secondary | ICD-10-CM

## 2014-08-29 DIAGNOSIS — Z8659 Personal history of other mental and behavioral disorders: Secondary | ICD-10-CM | POA: Insufficient documentation

## 2014-08-29 DIAGNOSIS — K5730 Diverticulosis of large intestine without perforation or abscess without bleeding: Secondary | ICD-10-CM | POA: Diagnosis not present

## 2014-08-29 DIAGNOSIS — G4733 Obstructive sleep apnea (adult) (pediatric): Secondary | ICD-10-CM | POA: Diagnosis not present

## 2014-08-29 DIAGNOSIS — Z7982 Long term (current) use of aspirin: Secondary | ICD-10-CM | POA: Diagnosis not present

## 2014-08-29 DIAGNOSIS — R1030 Lower abdominal pain, unspecified: Secondary | ICD-10-CM | POA: Diagnosis not present

## 2014-08-29 LAB — COMPREHENSIVE METABOLIC PANEL
ALT: 23 U/L (ref 17–63)
AST: 21 U/L (ref 15–41)
Albumin: 3.9 g/dL (ref 3.5–5.0)
Alkaline Phosphatase: 53 U/L (ref 38–126)
Anion gap: 7 (ref 5–15)
BUN: 20 mg/dL (ref 6–20)
CO2: 27 mmol/L (ref 22–32)
Calcium: 8.5 mg/dL — ABNORMAL LOW (ref 8.9–10.3)
Chloride: 96 mmol/L — ABNORMAL LOW (ref 101–111)
Creatinine, Ser: 1.3 mg/dL — ABNORMAL HIGH (ref 0.61–1.24)
GFR calc Af Amer: >60 mL/min (ref >60)
GFR calc non Af Amer: 54 mL/min — ABNORMAL LOW (ref >60)
Glucose, Bld: 124 mg/dL — ABNORMAL HIGH (ref 65–99)
Potassium: 3.4 mmol/L — ABNORMAL LOW (ref 3.5–5.1)
Sodium: 130 mmol/L — ABNORMAL LOW (ref 135–145)
Total Bilirubin: 0.5 mg/dL (ref 0.3–1.2)
Total Protein: 6.9 g/dL (ref 6.5–8.1)

## 2014-08-29 LAB — CBC WITH DIFFERENTIAL/PLATELET
Basophils Absolute: 0 10*3/uL (ref 0.0–0.1)
Basophils Relative: 0 % (ref 0–1)
Eosinophils Absolute: 0.2 10*3/uL (ref 0.0–0.7)
Eosinophils Relative: 3 % (ref 0–5)
HCT: 40.8 % (ref 39.0–52.0)
Hemoglobin: 14.5 g/dL (ref 13.0–17.0)
Lymphocytes Relative: 22 % (ref 12–46)
Lymphs Abs: 1.7 10*3/uL (ref 0.7–4.0)
MCH: 30.5 pg (ref 26.0–34.0)
MCHC: 35.5 g/dL (ref 30.0–36.0)
MCV: 85.9 fL (ref 78.0–100.0)
Monocytes Absolute: 0.8 10*3/uL (ref 0.1–1.0)
Monocytes Relative: 11 % (ref 3–12)
Neutro Abs: 4.8 10*3/uL (ref 1.7–7.7)
Neutrophils Relative %: 64 % (ref 43–77)
Platelets: 190 10*3/uL (ref 150–400)
RBC: 4.75 MIL/uL (ref 4.22–5.81)
RDW: 11.9 % (ref 11.5–15.5)
WBC: 7.5 10*3/uL (ref 4.0–10.5)

## 2014-08-29 LAB — URINALYSIS, ROUTINE W REFLEX MICROSCOPIC
Bilirubin Urine: NEGATIVE
Glucose, UA: NEGATIVE mg/dL
Hgb urine dipstick: NEGATIVE
Ketones, ur: NEGATIVE mg/dL
Leukocytes, UA: NEGATIVE
Nitrite: NEGATIVE
Protein, ur: NEGATIVE mg/dL
Specific Gravity, Urine: 1.007 (ref 1.005–1.030)
Urobilinogen, UA: 0.2 mg/dL (ref 0.0–1.0)
pH: 7 (ref 5.0–8.0)

## 2014-08-29 LAB — LIPASE, BLOOD: Lipase: 24 U/L (ref 22–51)

## 2014-08-29 MED ORDER — SODIUM CHLORIDE 0.9 % IV BOLUS (SEPSIS)
1000.0000 mL | Freq: Once | INTRAVENOUS | Status: AC
  Administered 2014-08-29: 1000 mL via INTRAVENOUS

## 2014-08-29 MED ORDER — POLYETHYLENE GLYCOL 3350 17 GM/SCOOP PO POWD: 17.0000 g | Freq: Every day | ORAL | Status: AC

## 2014-08-29 MED ORDER — MORPHINE SULFATE 4 MG/ML IJ SOLN
4.0000 mg | INTRAMUSCULAR | Status: DC | PRN
  Administered 2014-08-29 (×2): 4 mg via INTRAVENOUS
  Filled 2014-08-29 (×2): qty 1

## 2014-08-29 MED ORDER — IOHEXOL 300 MG/ML  SOLN
100.0000 mL | Freq: Once | INTRAMUSCULAR | Status: AC | PRN
  Administered 2014-08-29: 100 mL via INTRAVENOUS

## 2014-08-29 MED ORDER — ONDANSETRON HCL 4 MG/2ML IJ SOLN
4.0000 mg | Freq: Once | INTRAMUSCULAR | Status: AC
  Administered 2014-08-29: 4 mg via INTRAVENOUS
  Filled 2014-08-29: qty 2

## 2014-08-29 MED ORDER — IOHEXOL 300 MG/ML  SOLN
50.0000 mL | Freq: Once | INTRAMUSCULAR | Status: AC | PRN
  Administered 2014-08-29: 50 mL via ORAL

## 2014-08-29 MED ORDER — DOCUSATE SODIUM 100 MG PO CAPS: 100.0000 mg | ORAL_CAPSULE | Freq: Two times a day (BID) | ORAL | Status: DC

## 2014-08-29 NOTE — Discharge Instructions (Signed)
Constipation Constipation is when a person:  Poops (has a bowel movement) less than 3 times a week.  Has a hard time pooping.  Has poop that is dry, hard, or bigger than normal. HOME CARE   Eat foods with a lot of fiber in them. This includes fruits, vegetables, beans, and whole grains such as brown rice.  Avoid fatty foods and foods with a lot of sugar. This includes french fries, hamburgers, cookies, candy, and soda.  If you are not getting enough fiber from food, take products with added fiber in them (supplements).  Drink enough fluid to keep your pee (urine) clear or pale yellow.  Exercise on a regular basis, or as told by your doctor.  Go to the restroom when you feel like you need to poop. Do not hold it.  Only take medicine as told by your doctor. Do not take medicines that help you poop (laxatives) without talking to your doctor first. GET HELP RIGHT AWAY IF:   You have bright red blood in your poop (stool).  Your constipation lasts more than 4 days or gets worse.  You have belly (abdominal) or butt (rectal) pain.  You have thin poop (as thin as a pencil).  You lose weight, and it cannot be explained. MAKE SURE YOU:   Understand these instructions.  Will watch your condition.  Will get help right away if you are not doing well or get worse. Document Released: 08/26/2007 Document Revised: 03/14/2013 Document Reviewed: 12/19/2012 [facility] Patient Information 2015 [place], [place]. This information is not intended to replace advice given to you by your health care provider. Make sure you discuss any questions you have with your health care provider.  Abdominal Pain Many things can cause abdominal pain. Usually, abdominal pain is not caused by a disease and will improve without treatment. It can often be observed and treated at home. Your health care provider will do a physical exam and possibly order blood tests and X-rays to help determine the seriousness of your  pain. However, in many cases, more time must pass before a clear cause of the pain can be found. Before that point, your health care provider may not know if you need more testing or further treatment. HOME CARE INSTRUCTIONS  Monitor your abdominal pain for any changes. The following actions may help to alleviate any discomfort you are experiencing:  Only take over-the-counter or prescription medicines as directed by your health care provider.  Do not take laxatives unless directed to do so by your health care provider.  Try a clear liquid diet (broth, tea, or water) as directed by your health care provider. Slowly move to a bland diet as tolerated. SEEK MEDICAL CARE IF:  You have unexplained abdominal pain.  You have abdominal pain associated with nausea or diarrhea.  You have pain when you urinate or have a bowel movement.  You experience abdominal pain that wakes you in the night.  You have abdominal pain that is worsened or improved by eating food.  You have abdominal pain that is worsened with eating fatty foods.  You have a fever. SEEK IMMEDIATE MEDICAL CARE IF:   Your pain does not go away within 2 hours.  You keep throwing up (vomiting).  Your pain is felt only in portions of the abdomen, such as the right side or the left lower portion of the abdomen.  You pass bloody or black tarry stools. MAKE SURE YOU:  Understand these instructions.   Will watch your  condition.   Will get help right away if you are not doing well or get worse.  Document Released: 12/17/2004 Document Revised: 03/14/2013 Document Reviewed: 11/16/2012 [facility] Patient Information 2015 [place], [place]. This information is not intended to replace advice given to you by your health care provider. Make sure you discuss any questions you have with your health care provider.

## 2014-08-29 NOTE — ED Notes (Signed)
Pt c/o generalized abd pain x4-5 days. Pt c/o nausea but no vomiting or diarrhea.

## 2014-08-29 NOTE — Patient Instructions (Signed)
Gaze Stabilization: Tip Card 1.Target must remain in focus, not blurry, and appear stationary while head is in motion. 2.Perform exercises with small head movements (45 to either side of midline). 3.Increase speed of head motion so long as target is in focus. 4.If you wear eyeglasses, be sure you can see target through lens (therapist will give specific instructions for bifocal / progressive lenses). 5.These exercises may provoke dizziness or nausea. Work through these symptoms. If too dizzy, slow head movement slightly. Rest between each exercise. 6.Exercises demand concentration; avoid distractions. 7.For safety, perform standing exercises close to a counter, wall, corner, or next to someone.  Copyright  VHI. All rights reserved.  Gaze Stabilization: Standing Feet Apart   Feet shoulder width apart, keeping eyes on target on wall _3-5___ feet away, tilt head down slightly and move head side to side for __30__ seconds. Repeat while moving head up and down for __30__ seconds. Do __3-5__ sessions per day.  Copyright  VHI. All rights reserved.   Feet Apart (Compliant Surface) Varied Arm Positions - Eyes Closed   Stand on compliant surface: _pillow/couch cushion_______ with feet shoulder width apart and arms at your side. Close eyes and visualize upright position. Hold__30__ seconds. Repeat _3___ times per session. Do _2___ sessions per day. *PROGRESS BY MOVING FEET CLOSER TOGETHER  Copyright  VHI. All rights reserved.

## 2014-08-29 NOTE — Therapy (Signed)
[facility] [address] [place], [place], [address] Phone: [phone]   Fax:  [phone]  Physical Therapy Treatment  Patient Details  Name: Joshua [name] MRN: [id number] Date of Birth: Nov 27, 1940 Referring Provider:  [name], MD  Encounter Date: 08/29/2014      PT End of Session - 08/29/14 1405    Visit Number 2   Number of Visits 4   Date for PT Re-Evaluation 09/21/14   Authorization Type G code every 10th visit   PT Start Time 0935   PT Stop Time 1015   PT Time Calculation (min) 40 min   Activity Tolerance Patient tolerated treatment well   Behavior During Therapy [facility] for tasks assessed/performed      Past Medical History  Diagnosis Date  . Asthma   . CAD (coronary artery disease)     CABG 2001  . Depression   . GERD (gastroesophageal reflux disease)   . Hyperlipidemia   . IBS (irritable bowel syndrome)   . Acute medial meniscal tear   . Hypertension   . Anemia   . Diverticulosis   . Obesity   . Pancreatitis ~ 1980  . Acute sinusitis 12/14/2006    Qualifier: Diagnosis of  By: Arnoldo Morale MD, Balinda Quails   . Prostate cancer 04/2010  . 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 Pneumonia 1980's X 2  . OSA on CPAP   . History of blood transfusion 2001    "related to the bypass OR"  . DJD (degenerative joint disease)   . Arthritis     "middle finger right hand; right knee; neck" (02/06/2014)  . History of gout     Past Surgical History  Procedure Laterality Date  . Thyroidectomy, partial  1980  . Anterior cervical decomp/discectomy fusion  05/26/06  . Knee arthroscopy Right 1992; 04/2008  . Robot assisted laparoscopic radical prostatectomy  04/2010  . Nasal septum surgery Right 2007  . Back surgery    . Laparoscopic cholecystectomy  2007  . Cardiac catheterization  11/20/1999  . Coronary artery bypass graft  11/20/1999    SVG-RI1-RI2, SVG-OM, SVG-dRCA    There were no  vitals filed for this visit.  Visit Diagnosis:  Abnormality of gait  BPPV (benign paroxysmal positional vertigo), left      Subjective Assessment - 08/29/14 0936    Subjective The patient denies room spinning, but reports continued episodes of intermittent dizziness.  He describes a sensation of environmental movement "swaying" sensation. Patient also notes intermittent unsteadiness that occurs multiple times/day.  If he is really fatigued, he notes worse staggering.        NEUROMUSCULAR RE-EDUCATION: Sensory Organization Testing=69% compared to age/height normative value of 65%.   Patient demonstrated WNLs use of somatosensory feedback for balance, WNLs use of visual feedback for balance, and WNLs use of vestibular feedback for balance. Corner balance exercises consisting of: Standing on foam/compliant surface with feet apart progressing to together with eyes closed with verbal cues for safety.         Vestibular Assessment - 08/29/14 0940    Symptom Behavior   Type of Dizziness Unsteady with head/body turns   Frequency of Dizziness --  daily   Duration of Dizziness --  30-40 seconds   Aggravating Factors --  when up moving   Relieving Factors Head stationary   Vestibulo-Occular Reflex   VOR 1 Head Only (x 1 viewing) --  visual blurring noted  Comment DVA vs. SVA is 4 line difference   Equities trader Comment 69%          Vestibular Treatment/Exercise - 08/29/14 0001    Vestibular Treatment/Exercise   Vestibular Treatment Provided Gaze   Gaze Exercises X1 Viewing Horizontal;X1 Viewing Vertical   X1 Viewing Horizontal   Foot Position feet apart   Time --  30   Reps 3   Comments visual blurring with activity   X1 Viewing Vertical   Foot Position feet apart   Time --  30 sec   Reps 3   Comments visual changes with faster pace               PT Education - 08/29/14 1404    Education provided  Yes   Education Details HEP: gaze x 1 viewing horiz/vertical; foam standing with eyes closed   Person(s) Educated Patient   Methods Explanation;Demonstration;Handout   Comprehension Returned demonstration;Verbalized understanding             PT Long Term Goals - 08/22/14 0953    PT LONG TERM GOAL #1   Title The patient will be indep with HEP for habituation, gaze x 1 viewing and high level balance activities.   Baseline Target date 09/21/2014   Time 4   Period Weeks   PT LONG TERM GOAL #2   Title The patient will subjectively report 0/10 symptoms of dizziness with horizontal rolling (improved from 2/10).   Baseline Target date 09/21/2014   Time 4   Period Weeks   PT LONG TERM GOAL #3   Title The patient will complete 360 degree turn safety to L and R sides wihtout loss of balance.   Baseline Target date 09/21/2014   Time 4   Period Weeks   PT LONG TERM GOAL #4   Title The patient will return to prior wellness activities at gym and with walking program.   Baseline Target date 09/21/2014   Time 4   Period Weeks               Plan - 08/29/14 1405    Clinical Impression Statement The patient is no longer having difficulties with horizontal rolling and standing head turns.  He performed well on sensory organization testing, however it reproduced a sensation of imbalance he feels when out of bed at night. PT updated his HEP to be more challenging and plans to have one further f/u to check progress with gaze activities.   PT Next Visit Plan Check HEP, recheck for BPPV as needed, dynamic gait with head turns        Problem List Patient Active Problem List   Diagnosis Date Noted  . Allergic rhinitis 05/31/2014  . Other malaise and fatigue 11/09/2013  . CKD (chronic kidney disease), stage III 10/23/2013  . Dizziness 08/23/2013  . Neuropathy 08/21/2013  . RLS (restless legs syndrome) 01/03/2013  . Low back pain radiating to left leg 10/09/2011  . ADENOCARCINOMA, PROSTATE,  GLEASON GRADE 6 04/01/2010  . OSTEOARTHRITIS, LOWER LEG, LEFT 10/04/2009  . TOBACCO ABUSE, HX OF 12/26/2008  . ANEMIA, IRON DEFICIENCY 11/06/2008  . 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 Essential hypertension 02/21/2007  . OSA (obstructive sleep apnea) 11/02/2006  . Hyperlipidemia 10/12/2006  . DEPRESSION 10/12/2006  . Coronary atherosclerosis 10/12/2006  . Asthma 10/12/2006  . GERD 10/12/2006  . Irritable bowel syndrome 10/12/2006    Brysen Shankman, PT 08/29/2014, 2:12 PM  [facility]  [facility] [address] [place], [place], [address] Phone: [phone]   Fax:  [phone]

## 2014-08-29 NOTE — ED Provider Notes (Signed)
CSN: [id number]     Arrival date & time 08/29/14  1849 History   This chart was scribed for Tanna Furry, MD by Julien Nordmann, ED Scribe. This patient was seen in room MH04/MH04 and the patient's care was started at 7:19 PM.     Chief Complaint  Patient presents with  . Abdominal Pain     The history is provided by the patient. No language interpreter was used.  HPI Comments: Joshua [name] is a 74 y.o. male who has PMHx diverticulitis presents to the Emergency Department complaining of constant, gradual worsening generalized abdominal pain onset 5 days ago. Pt has associated constipation, nausea, bloating, distending and dysuria. Pt reports today the pain has gotten worse which brought him to ED. He reports when he urinates, he feels as if he is "urinating acid"  Pt denies fevers, chills, vomiting, chest pain, and shortness of breath. Pt reports having a colonoscopy about 5+ years ago with a normal exam.  Past Medical History  Diagnosis Date  . Asthma   . CAD (coronary artery disease)     CABG 2001  . Depression   . GERD (gastroesophageal reflux disease)   . Hyperlipidemia   . IBS (irritable bowel syndrome)   . Acute medial meniscal tear   . Hypertension   . Anemia   . Diverticulosis   . Obesity   . Pancreatitis ~ 1980  . Acute sinusitis 12/14/2006    Qualifier: Diagnosis of  By: Arnoldo Morale MD, Balinda Quails   . Prostate cancer 04/2010  . 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 Pneumonia 1980's X 2  . OSA on CPAP   . History of blood transfusion 2001    "related to the bypass OR"  . DJD (degenerative joint disease)   . Arthritis     "middle finger right hand; right knee; neck" (02/06/2014)  . History of gout    Past Surgical History  Procedure Laterality Date  . Thyroidectomy, partial  1980  . Anterior cervical decomp/discectomy fusion  05/26/06  . Knee arthroscopy Right 1992; 04/2008  . Robot assisted laparoscopic radical prostatectomy  04/2010  . Nasal septum  surgery Right 2007  . Back surgery    . Laparoscopic cholecystectomy  2007  . Cardiac catheterization  11/20/1999  . Coronary artery bypass graft  11/20/1999    SVG-RI1-RI2, SVG-OM, SVG-dRCA   Family History  Problem Relation Age of Onset  . Heart disease Father   . Heart disease Mother    History  Substance Use Topics  . Smoking status: Former Smoker -- 1.00 packs/day for 35 years    Types: Cigarettes    Quit date: 06/16/1992  . Smokeless tobacco: Never Used  . Alcohol Use: 2.4 oz/week    4 Shots of liquor per week     Comment: mixed drink    Review of Systems  Constitutional: Negative for fever, chills, diaphoresis, appetite change and fatigue.  HENT: Negative for mouth sores, sore throat and trouble swallowing.   Eyes: Negative for visual disturbance.  Respiratory: Negative for cough, chest tightness, shortness of breath and wheezing.   Cardiovascular: Negative for chest pain.  Gastrointestinal: Positive for abdominal pain and abdominal distention. Negative for nausea, vomiting and diarrhea.  Endocrine: Negative for polydipsia, polyphagia and polyuria.  Genitourinary: Positive for dysuria. Negative for frequency and hematuria.  Musculoskeletal: Negative for gait problem.  Skin: Negative for color change, pallor and rash.  Neurological: Negative for dizziness, syncope, light-headedness and headaches.  Hematological: Does not bruise/bleed easily.  Psychiatric/Behavioral: Negative for behavioral problems and confusion.      Allergies  Azithromycin; Benadryl; Bromfed; Clarithromycin; Codeine; Doxycycline hyclate; Modafinil; Promethazine hcl; Telithromycin; Erythromycin base; Hydrocodone-acetaminophen; and Sulfonamide derivatives  Home Medications   Prior to Admission medications   Medication Sig Start Date End Date Taking? Authorizing Provider  acetaminophen (TYLENOL) 500 MG tablet Take 1,000 mg by mouth every 6 (six) hours as needed for headache (pain).    Historical  Provider, MD  albuterol (PROAIR HFA) 108 (90 BASE) MCG/ACT inhaler Inhale 1-2 puffs into the lungs every 6 (six) hours as needed for wheezing or shortness of breath.    Historical Provider, MD  aspirin EC 81 MG tablet Take 81 mg by mouth daily.    Historical Provider, MD  beclomethasone (QVAR) 80 MCG/ACT inhaler Inhale 1 puff into the lungs 2 (two) times daily.    Historical Provider, MD  BENICAR HCT 40-25 MG per tablet TAKE ONE TABLET BY MOUTH ONE TIME DAILY 08/27/14   [name], MD  Cyanocobalamin (VITAMIN B-12) 500 MCG SUBL Place 500 mcg under the tongue daily.    Historical Provider, MD  desloratadine (CLARINEX) 5 MG tablet Take 5 mg by mouth at bedtime.     Historical Provider, MD  diazepam (VALIUM) 5 MG tablet Take 5 mg by mouth at bedtime as needed for anxiety (sleep).    Historical Provider, MD  docusate sodium (COLACE) 100 MG capsule Take 1 capsule (100 mg total) by mouth every 12 (twelve) hours. 08/29/14   Tanna Furry, MD  FLOVENT HFA 110 MCG/ACT inhaler Inhale 2 puffs into the lungs 2 (two) times daily. 07/17/14   Historical Provider, MD  fluticasone (FLONASE) 50 MCG/ACT nasal spray Place 1 spray into both nostrils 2 (two) times daily.  09/05/12   Ricard Dillon, MD  folic acid (FOLVITE) 1 MG tablet Take 1 mg by mouth daily.      Historical Provider, MD  gabapentin (NEURONTIN) 100 MG capsule Take 2 caps in AM, 1 cap in evening, 2 caps at bedtime 05/24/14   Cameron Sprang, MD  LAC-HYDRIN FIVE 5 % LOTN lotion APPLY TO AFFECTED AREA(S) TWICE DAILY 12/06/11   Ricard Dillon, MD  omeprazole (PRILOSEC) 20 MG capsule Take 1 capsule (20 mg total) by mouth daily as needed (acid reflux/heartburn). 03/27/14   [name], MD  ondansetron (ZOFRAN) 8 MG tablet Take 8 mg by mouth every 8 (eight) hours as needed for nausea or vomiting.    Historical Provider, MD  polyethylene glycol powder (GLYCOLAX/MIRALAX) powder Take 17 g by mouth daily. 08/29/14   Tanna Furry, MD  Probiotic Product (ALIGN) 4 MG CAPS  Take 4 mg by mouth daily.     Historical Provider, MD  simvastatin (ZOCOR) 20 MG tablet TAKE ONE TABLET BY MOUTH AT BEDTIME 08/06/14   [name], MD   Triage vitals: BP 136/71 mmHg  Pulse 78  Temp(Src) 97.6 F (36.4 C) (Oral)  Resp 18  Ht 5' 5.75" (1.67 m)  Wt 202 lb (91.627 kg)  BMI 32.85 kg/m2  SpO2 94% Physical Exam  Constitutional: He is oriented to person, place, and time. He appears well-developed and well-nourished. No distress.  HENT:  Head: Normocephalic.  Eyes: Conjunctivae are normal. Pupils are equal, round, and reactive to light. No scleral icterus.  Neck: Normal range of motion. Neck supple. No thyromegaly present.  Cardiovascular: Normal rate and regular rhythm.  Exam reveals no gallop and no friction rub.  No murmur heard. Pulmonary/Chest: Effort normal and breath sounds normal. No respiratory distress. He has no wheezes. He has no rales.  Abdominal: Soft. Bowel sounds are normal. He exhibits no distension. There is tenderness. There is no rebound.  Mild generalized tenderness without rebound  Musculoskeletal: Normal range of motion.  Neurological: He is alert and oriented to person, place, and time.  Skin: Skin is warm and dry. No rash noted.  Psychiatric: He has a normal mood and affect. His behavior is normal.  Nursing note and vitals reviewed.   ED Course  Procedures  DIAGNOSTIC STUDIES: Oxygen Saturation is 94% on RA, low by my interpretation.  COORDINATION OF CARE:  7:24 PM Discussed treatment plan which includes blood work, urinalysis CT scan, zofran and morphine with pt at bedside and pt agreed to plan.   Labs Review Labs Reviewed  COMPREHENSIVE METABOLIC PANEL - Abnormal; Notable for the following:    Sodium 130 (*)    Potassium 3.4 (*)    Chloride 96 (*)    Glucose, Bld 124 (*)    Creatinine, Ser 1.30 (*)    Calcium 8.5 (*)    GFR calc non Af Amer 54 (*)    All other components within normal limits  URINALYSIS, ROUTINE W REFLEX  MICROSCOPIC (NOT AT [facility])  CBC WITH DIFFERENTIAL/PLATELET  LIPASE, BLOOD    Imaging Review Ct Abdomen Pelvis W Contrast  08/29/2014   CLINICAL DATA:  74 year old male with mid to lower abdominal pain for the past 5 days. There is associated constipation, nausea, bloating and abdominal distention. Dysuria. History of bladder cancer and prostate cancer. Status post prostatectomy.  EXAM: CT ABDOMEN AND PELVIS WITH CONTRAST  TECHNIQUE: Multidetector CT imaging of the abdomen and pelvis was performed using the standard protocol following bolus administration of intravenous contrast.  CONTRAST:  144mL OMNIPAQUE IOHEXOL 300 MG/ML  SOLN  COMPARISON:  CT of the abdomen and pelvis 02/19/2010.  FINDINGS: Lower chest: Mild scarring in the inferior segment of the lingula. Atherosclerotic calcifications in the left anterior descending, left circumflex and right coronary arteries. Median sternotomy wires.  Hepatobiliary: No cystic or solid hepatic lesions. No intra or extrahepatic biliary ductal dilatation. Status post cholecystectomy.  Pancreas: No pancreatic mass. No pancreatic ductal dilatation. No pancreatic or peripancreatic fluid or inflammatory changes.  Spleen: Unremarkable.  Adrenals/Urinary Tract: Bilateral adrenal glands are normal in appearance. Multifocal cortical thinning in the kidneys bilaterally (right greater than left), most compatible with chronic post infectious or inflammatory scarring. Multiple sub cm low-attenuation lesions in the kidneys bilaterally are too small to definitively characterize, but are similar to the prior study, favored to represent small cysts. No suspicious renal lesions are noted. No hydroureteronephrosis. Urinary bladder is normal in appearance.  Stomach/Bowel: Stomach is normal in appearance. No pathologic dilatation of small bowel or colon. Numerous colonic diverticulae are noted, particularly in the sigmoid colon, without surrounding inflammatory changes to suggest an acute  diverticulitis at this time. Normal appendix.  Vascular/Lymphatic: Extensive atherosclerosis throughout the abdominal and pelvic vasculature, including fusiform ectasia of the infrarenal abdominal aorta which measures up to 2.8 x 2.6 cm. No lymphadenopathy noted in the abdomen or pelvis.  Reproductive: Status post radical prostatectomy.  Other: Small left inguinal hernia containing only fat. No significant volume of ascites. No pneumoperitoneum.  Musculoskeletal: Median sternotomy wires. There are no aggressive appearing lytic or blastic lesions noted in the visualized portions of the skeleton.  IMPRESSION: 1. No acute findings in the abdomen or pelvis to account for  the patient's symptoms. 2. Colonic diverticulosis without evidence of acute diverticulitis at this time. 3. Normal appendix. 4. Extensive atherosclerosis, including multivessel coronary artery disease as well as fusiform ectasia of the infrarenal abdominal aorta which measures up to 2.6 x 2.8 cm. 5. Multifocal cortical thinning in the kidneys bilaterally, compatible with post infectious or inflammatory scarring. 6. Additional incidental findings, as above.   Electronically Signed   By: Vinnie Langton M.D.   On: 08/29/2014 22:08     EKG Interpretation None      MDM   Final diagnoses:  AP (abdominal pain)  Constipation, unspecified constipation type    Without leukocytosis. Patient without acute abnormalities on CT to explain pain. By history is constipated. Did discuss the subtle incidental findings including minimal aneurysm on his CT scan with him. He appropriate for outpatient treatment with softeners and waxes and recheck if any worsening symptoms.   I personally performed the services described in this documentation, which was scribed in my presence. The recorded information has been reviewed and is accurate.   Tanna Furry, MD 08/29/14 [phone]

## 2014-08-29 NOTE — ED Notes (Addendum)
Center abd pain w distension,  Feels like to have bm but unable to  Nausea  X 4 weeks

## 2014-09-03 DIAGNOSIS — J301 Allergic rhinitis due to pollen: Secondary | ICD-10-CM | POA: Diagnosis not present

## 2014-09-03 DIAGNOSIS — J3081 Allergic rhinitis due to animal (cat) (dog) hair and dander: Secondary | ICD-10-CM | POA: Diagnosis not present

## 2014-09-03 DIAGNOSIS — J3089 Other allergic rhinitis: Secondary | ICD-10-CM | POA: Diagnosis not present

## 2014-09-04 ENCOUNTER — Encounter: Payer: Self-pay | Admitting: Family Medicine

## 2014-09-04 ENCOUNTER — Encounter: Payer: Self-pay | Admitting: Rehabilitative and Restorative Service Providers"

## 2014-09-04 ENCOUNTER — Ambulatory Visit: Payer: Medicare Other | Admitting: Rehabilitative and Restorative Service Providers"

## 2014-09-04 ENCOUNTER — Ambulatory Visit: Payer: Medicare Other | Admitting: Pulmonary Disease

## 2014-09-05 ENCOUNTER — Telehealth: Payer: Self-pay | Admitting: Gastroenterology

## 2014-09-05 ENCOUNTER — Telehealth: Payer: Self-pay | Admitting: Rehabilitative and Restorative Service Providers"

## 2014-09-05 ENCOUNTER — Telehealth: Payer: Self-pay | Admitting: Family Medicine

## 2014-09-05 ENCOUNTER — Encounter: Payer: Self-pay | Admitting: *Deleted

## 2014-09-05 NOTE — Telephone Encounter (Signed)
The patient sent an e-mail question asking PT to let MD know he is doing better and can drive. PT called the patient and he reports his vertigo is diminishing and that he is not staggering with gait even when getting up in the middle of the night.  He requested that PT let MD know he can drive. I explained that in physical therapy, he is doing well from a functional mobility and balance standpoint, but we are not able to assess his ability to safely return to driving.  He should f/u with MD office re: recommendations for driving and discuss his current symptoms with MD.   He requested I send most recent PT note to MD to ensure they are aware of his progress.  Patient to continue his current home exercise program.

## 2014-09-05 NOTE — Telephone Encounter (Signed)
Pt called to say that Dr Maudie Mercury gave him a note that said he can not drive. He called today to ask for a note that say he can drive

## 2014-09-05 NOTE — Telephone Encounter (Signed)
Dr Frederico Hamman also sent through pts Mychart acct.

## 2014-09-05 NOTE — Telephone Encounter (Signed)
Spoke with patient and he went to ED last Wednesday for upper abdominal pain. It went away Sunday and Monday but is back today. He reports trouble with constipation also. He does not have a preference for new GI MD. Scheduled with Nicoletta Ba, PA on 09/20/14 at 2:00 PM.

## 2014-09-07 DIAGNOSIS — M25551 Pain in right hip: Secondary | ICD-10-CM | POA: Diagnosis not present

## 2014-09-08 ENCOUNTER — Other Ambulatory Visit: Payer: Self-pay | Admitting: Family Medicine

## 2014-09-10 ENCOUNTER — Other Ambulatory Visit: Payer: Self-pay | Admitting: Family Medicine

## 2014-09-10 DIAGNOSIS — M25551 Pain in right hip: Secondary | ICD-10-CM | POA: Diagnosis not present

## 2014-09-10 DIAGNOSIS — R262 Difficulty in walking, not elsewhere classified: Secondary | ICD-10-CM | POA: Diagnosis not present

## 2014-09-10 DIAGNOSIS — S76311D Strain of muscle, fascia and tendon of the posterior muscle group at thigh level, right thigh, subsequent encounter: Secondary | ICD-10-CM | POA: Diagnosis not present

## 2014-09-10 NOTE — Telephone Encounter (Signed)
Ok to fill 

## 2014-09-11 ENCOUNTER — Other Ambulatory Visit: Payer: Self-pay

## 2014-09-11 DIAGNOSIS — M25511 Pain in right shoulder: Secondary | ICD-10-CM | POA: Diagnosis not present

## 2014-09-11 DIAGNOSIS — R262 Difficulty in walking, not elsewhere classified: Secondary | ICD-10-CM | POA: Diagnosis not present

## 2014-09-11 DIAGNOSIS — S76311D Strain of muscle, fascia and tendon of the posterior muscle group at thigh level, right thigh, subsequent encounter: Secondary | ICD-10-CM | POA: Diagnosis not present

## 2014-09-11 MED ORDER — DIAZEPAM 5 MG PO TABS: 5.0000 mg | ORAL_TABLET | Freq: Every evening | ORAL | Status: DC | PRN

## 2014-09-12 DIAGNOSIS — S76311D Strain of muscle, fascia and tendon of the posterior muscle group at thigh level, right thigh, subsequent encounter: Secondary | ICD-10-CM | POA: Diagnosis not present

## 2014-09-12 DIAGNOSIS — M25511 Pain in right shoulder: Secondary | ICD-10-CM | POA: Diagnosis not present

## 2014-09-12 DIAGNOSIS — R262 Difficulty in walking, not elsewhere classified: Secondary | ICD-10-CM | POA: Diagnosis not present

## 2014-09-12 DIAGNOSIS — J301 Allergic rhinitis due to pollen: Secondary | ICD-10-CM | POA: Diagnosis not present

## 2014-09-13 ENCOUNTER — Ambulatory Visit (INDEPENDENT_AMBULATORY_CARE_PROVIDER_SITE_OTHER): Payer: Medicare Other | Admitting: Family Medicine

## 2014-09-13 ENCOUNTER — Encounter: Payer: Self-pay | Admitting: Family Medicine

## 2014-09-13 DIAGNOSIS — K5900 Constipation, unspecified: Secondary | ICD-10-CM | POA: Diagnosis not present

## 2014-09-13 DIAGNOSIS — I25118 Atherosclerotic heart disease of native coronary artery with other forms of angina pectoris: Secondary | ICD-10-CM | POA: Diagnosis not present

## 2014-09-13 DIAGNOSIS — I10 Essential (primary) hypertension: Secondary | ICD-10-CM | POA: Diagnosis not present

## 2014-09-13 DIAGNOSIS — R5383 Other fatigue: Secondary | ICD-10-CM | POA: Diagnosis not present

## 2014-09-13 NOTE — Patient Instructions (Signed)
Orders Placed This Encounter  Procedures  . TSH    [place]  . T4, free    [place]  . T3, Free  we will check all of the following thyroid labs. If there are abnormalities, potentially refer to endocrine. If normal, very very very unlikely your thyroid is the cause of your symptoms  Glad dizziness is better as well as abdominal pain and constipation  Blood pressure looks great  No changes today

## 2014-09-13 NOTE — Assessment & Plan Note (Signed)
S: chronic issue. Wife feels that patient should see an endocrinologist as she states he does eat enough to weigh as much as he does. Patient admits to not always makign best food choices. Patient states he had a cyst on his thyroid removed at 37 but hs had no issues since then. In 2014, tsh,t3,t4 normal. TSH normal 10/2013 as well.  A/P: check TSH, T3, T4 again- think thyroid issues is very very unlikely. Discussed if there were issues, could consider endocrine.

## 2014-09-13 NOTE — Progress Notes (Signed)
Joshua Reddish, MD  Subjective:  Joshua [name] is a 74 y.o. year old very pleasant male patient who presents with:  See problem oriented charting ROS- no chest pain, shortness of breath. Abdominal pain improved with regular toileting on miralax.   Past Medical History- CAD, IBS, HLD, depression, HTN, asthma, CKD III. Vertigo has resolved.   Medications- reviewed and updated Current Outpatient Prescriptions  Medication Sig Dispense Refill  . aspirin EC 81 MG tablet Take 81 mg by mouth daily.    . beclomethasone (QVAR) 80 MCG/ACT inhaler Inhale 1 puff into the lungs 2 (two) times daily.    [name] BENICAR HCT 40-25 MG per tablet TAKE ONE TABLET BY MOUTH ONE TIME DAILY 30 tablet 5  . Cyanocobalamin (VITAMIN B-12) 500 MCG SUBL Place 500 mcg under the tongue daily.    [name] desloratadine (CLARINEX) 5 MG tablet Take 5 mg by mouth at bedtime.     . docusate sodium (COLACE) 100 MG capsule Take 1 capsule (100 mg total) by mouth every 12 (twelve) hours. 60 capsule 0  . FLOVENT HFA 110 MCG/ACT inhaler Inhale 2 puffs into the lungs 2 (two) times daily.  5  . fluticasone (FLONASE) 50 MCG/ACT nasal spray Place 1 spray into both nostrils 2 (two) times daily.     . folic acid (FOLVITE) 1 MG tablet Take 1 mg by mouth daily.      [name] gabapentin (NEURONTIN) 100 MG capsule Take 2 caps in AM, 1 cap in evening, 2 caps at bedtime 150 capsule 11  . LAC-HYDRIN FIVE 5 % LOTN lotion APPLY TO AFFECTED AREA(S) TWICE DAILY 120 mL 2  . omeprazole (PRILOSEC) 20 MG capsule TAKE ONE CAPSULE BY MOUTH DAILY AS NEEDED FOR ACID REFLUX/HEARTBURN 90 capsule 2  . polyethylene glycol powder (GLYCOLAX/MIRALAX) powder Take 17 g by mouth daily. 255 g 0  . Probiotic Product (ALIGN) 4 MG CAPS Take 4 mg by mouth daily.     . simvastatin (ZOCOR) 20 MG tablet TAKE ONE TABLET BY MOUTH AT BEDTIME 30 tablet 5  . acetaminophen (TYLENOL) 500 MG tablet Take 1,000 mg by mouth every 6 (six) hours as needed for headache (pain).    [name] albuterol (PROAIR  HFA) 108 (90 BASE) MCG/ACT inhaler Inhale 1-2 puffs into the lungs every 6 (six) hours as needed for wheezing or shortness of breath.    . diazepam (VALIUM) 5 MG tablet TAKE ONE TABLET BY MOUTH AT BEDTIME AS NEEDED (Patient not taking: Reported on 09/13/2014) 30 tablet 2  . diazepam (VALIUM) 5 MG tablet Take 1 tablet (5 mg total) by mouth at bedtime as needed for anxiety (sleep). (Patient not taking: Reported on 09/13/2014) 30 tablet 2  . ondansetron (ZOFRAN) 8 MG tablet Take 8 mg by mouth every 8 (eight) hours as needed for nausea or vomiting.     Objective: BP 116/64 mmHg  Pulse 84  Temp(Src) 98.5 F (36.9 C)  Wt 204 lb (92.534 kg) Gen: NAD, resting comfortably CV: RRR no murmurs rubs or gallops Lungs: CTAB no crackles, wheeze, rhonchi Abdomen: soft/mild diffuse tenderness/nondistended/normal bowel sounds. No rebound or guarding.  Ext: 1+ edema bilaterally Skin: warm, dry Neuro: grossly normal, moves all extremities, normal gait   Assessment/Plan:  Essential hypertension S: controlled on olmesartan-hctz 40-25 A/P: continue current rx  Fatigue S: chronic issue. Wife feels that patient should see an endocrinologist as she states he does eat enough to weigh as much as he does. Patient admits to not always makign best food choices.  Patient states he had a cyst on his thyroid removed at 37 but hs had no issues since then. In 2014, tsh,t3,t4 normal. TSH normal 10/2013 as well.  A/P: check TSH, T3, T4 again- think thyroid issues is very very unlikely. Discussed if there were issues, could consider endocrine.    Return precautions advised. 3 months f/u planned.   Orders Placed This Encounter  Procedures  . TSH    [place]  . T4, free    [place]  . T3, Free

## 2014-09-13 NOTE — Assessment & Plan Note (Signed)
S: controlled on olmesartan-hctz 40-25 A/P: continue current rx

## 2014-09-14 LAB — T3, FREE: T3, Free: 2.6 pg/mL (ref 2.3–4.2)

## 2014-09-14 LAB — TSH: TSH: 1.01 u[IU]/mL (ref 0.35–4.50)

## 2014-09-14 LAB — T4, FREE: Free T4: 0.83 ng/dL (ref 0.60–1.60)

## 2014-09-20 ENCOUNTER — Ambulatory Visit: Payer: Medicare Other | Admitting: Rehabilitative and Restorative Service Providers"

## 2014-09-20 ENCOUNTER — Encounter: Payer: Self-pay | Admitting: Rehabilitative and Restorative Service Providers"

## 2014-09-20 ENCOUNTER — Encounter: Payer: Self-pay | Admitting: Physician Assistant

## 2014-09-20 ENCOUNTER — Ambulatory Visit (INDEPENDENT_AMBULATORY_CARE_PROVIDER_SITE_OTHER): Payer: Medicare Other | Admitting: Physician Assistant

## 2014-09-20 VITALS — BP 131/81

## 2014-09-20 VITALS — BP 120/60 | HR 79 | Ht 65.75 in | Wt 204.0 lb

## 2014-09-20 DIAGNOSIS — K5900 Constipation, unspecified: Secondary | ICD-10-CM

## 2014-09-20 DIAGNOSIS — K5732 Diverticulitis of large intestine without perforation or abscess without bleeding: Secondary | ICD-10-CM

## 2014-09-20 DIAGNOSIS — I25118 Atherosclerotic heart disease of native coronary artery with other forms of angina pectoris: Secondary | ICD-10-CM | POA: Diagnosis not present

## 2014-09-20 DIAGNOSIS — H8112 Benign paroxysmal vertigo, left ear: Secondary | ICD-10-CM | POA: Diagnosis not present

## 2014-09-20 DIAGNOSIS — R269 Unspecified abnormalities of gait and mobility: Secondary | ICD-10-CM

## 2014-09-20 DIAGNOSIS — R1032 Left lower quadrant pain: Secondary | ICD-10-CM | POA: Diagnosis not present

## 2014-09-20 MED ORDER — METRONIDAZOLE 500 MG PO TABS: 500.0000 mg | ORAL_TABLET | Freq: Two times a day (BID) | ORAL | Status: DC

## 2014-09-20 MED ORDER — CIPROFLOXACIN HCL 500 MG PO TABS: 500.0000 mg | ORAL_TABLET | Freq: Two times a day (BID) | ORAL | Status: DC

## 2014-09-20 MED ORDER — AMOXICILLIN-POT CLAVULANATE 875-125 MG PO TABS: 1.0000 | ORAL_TABLET | Freq: Two times a day (BID) | ORAL | Status: AC

## 2014-09-20 NOTE — Patient Instructions (Addendum)
We sent prescriptions to [facility], [address]. 1.  Flagyl  500 mg We will call [facility] with one more medication.   Continue Miralax 17 grams in 8 oz of water.  Continue the daily probiotic.   We made you an appointment with Amy Esterwood PA-C for 10-15-2014 at 1:30 PM .             Normal BMI (Body Mass Index- based on height and weight) is between 23 and 30. Your BMI today is Body mass index is 33.18 kg/(m^2). [name] Please consider follow up  regarding your BMI with your Primary Care Provider.

## 2014-09-20 NOTE — Therapy (Signed)
[facility] [address] [place], [place], [address] Phone: [phone]   Fax:  [phone]  Physical Therapy Treatment  Patient Details  Name: Joshua [name] MRN: [id number] Date of Birth: Mar 30, 1940 Referring Provider:  [name], MD  Encounter Date: 09/20/2014      PT End of Session - 09/20/14 0927    Visit Number 3   Number of Visits 4   Date for PT Re-Evaluation 09/21/14   Authorization Type G code every 10th visit   PT Start Time 0848   PT Stop Time 0924   PT Time Calculation (min) 36 min   Activity Tolerance Patient tolerated treatment well   Behavior During Therapy [facility] for tasks assessed/performed      Past Medical History  Diagnosis Date  . Asthma   . CAD (coronary artery disease)     CABG 2001  . Depression   . GERD (gastroesophageal reflux disease)   . Hyperlipidemia   . IBS (irritable bowel syndrome)   . Acute medial meniscal tear   . Hypertension   . Anemia   . Diverticulosis   . Obesity   . Pancreatitis ~ 1980  . Acute sinusitis 12/14/2006    Qualifier: Diagnosis of  By: Arnoldo Morale MD, Balinda Quails   . Prostate cancer 04/2010  . 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 Pneumonia 1980's X 2  . OSA on CPAP   . History of blood transfusion 2001    "related to the bypass OR"  . DJD (degenerative joint disease)   . Arthritis     "middle finger right hand; right knee; neck" (02/06/2014)  . History of gout     Past Surgical History  Procedure Laterality Date  . Thyroidectomy, partial  1980  . Anterior cervical decomp/discectomy fusion  05/26/06  . Knee arthroscopy Right 1992; 04/2008  . Robot assisted laparoscopic radical prostatectomy  04/2010  . Nasal septum surgery Right 2007  . Back surgery    . Laparoscopic cholecystectomy  2007  . Cardiac catheterization  11/20/1999  . Coronary artery bypass graft  11/20/1999    SVG-RI1-RI2, SVG-OM, SVG-dRCA    Filed Vitals:    09/20/14 0901  BP: 131/81    Visit Diagnosis:  Abnormality of gait      Subjective Assessment - 09/20/14 0852    Subjective The patient reports a sensation of environmental movement mostly when tired.  He is experiencing some now that he describes as "I'm rotating and not the room".  The patient notes symptoms are worse when fatigued and he continues to feel like he has to hold onto walls at times. Pt reports some depression.   Currently in Pain? Yes  Not related to PT focus; PT not to address.   Pain Location Penis  from h/o prostate surgery years ago          NEUROMUSCULAR RE-EDUCATION: Gaze x 1 viewing horizontal and vertical Single limb stance Pillow standing with eyes closed with progressively narrowing base of support      Vestibular Assessment - 09/20/14 0902    Positional Testing   Sidelying Test Sidelying Right;Sidelying Left   Horizontal Canal Testing Horizontal Canal Right;Horizontal Canal Left   Sidelying Right   Sidelying Right Symptoms No nystagmus   Sidelying Left   Sidelying Left Symptoms No nystagmus   Horizontal Canal Right   Horizontal Canal Right Symptoms Normal   Horizontal Canal Left   Horizontal Canal Left Symptoms  Normal     Discussed post d/c walking and exercise plan.        PT Education - 09/20/14 [phone]    Education provided Yes   Education Details HEP: single limb stance   Person(s) Educated Patient   Methods Explanation;Demonstration;Handout   Comprehension Verbalized understanding;Returned demonstration             PT Long Term Goals - 09/20/14 0913    PT LONG TERM GOAL #1   Title The patient will be indep with HEP for habituation, gaze x 1 viewing and high level balance activities.   Baseline Met on 09/20/2014   Time 4   Period Weeks   Status Achieved   PT LONG TERM GOAL #2   Title The patient will subjectively report 0/10 symptoms of dizziness with horizontal rolling (improved from 2/10).   Baseline Met on  09/20/2014   Time 4   Period Weeks   Status Achieved   PT LONG TERM GOAL #3   Title The patient will complete 360 degree turn safety to L and R sides wihtout loss of balance.   Baseline Met on 09/20/2014   Time 4   Period Weeks   Status Achieved   PT LONG TERM GOAL #4   Title The patient will return to prior wellness activities at gym and with walking program.   Baseline Met on 09/20/2014- changed routine (stopped body pump and cycle), doing cardio regularly, lifts some weights   Time 4   Period Weeks   Status Achieved               Plan - 09/20/14 1314    Clinical Impression Statement The patient met all LTGs.  Recommended patient continue gym routine for continued fitness.   PT Next Visit Plan Discharge from PT   Consulted and Agree with Plan of Care Patient        Problem List Patient Active Problem List   Diagnosis Date Noted  . Allergic rhinitis 05/31/2014  . Fatigue 11/09/2013  . CKD (chronic kidney disease), stage III 10/23/2013  . Dizziness 08/23/2013  . Neuropathy 08/21/2013  . RLS (restless legs syndrome) 01/03/2013  . Low back pain radiating to left leg 10/09/2011  . ADENOCARCINOMA, PROSTATE, GLEASON GRADE 6 04/01/2010  . OSTEOARTHRITIS, LOWER LEG, LEFT 10/04/2009  . TOBACCO ABUSE, HX OF 12/26/2008  . ANEMIA, IRON DEFICIENCY 11/06/2008  . 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 Essential hypertension 02/21/2007  . OSA (obstructive sleep apnea) 11/02/2006  . Hyperlipidemia 10/12/2006  . DEPRESSION 10/12/2006  . Coronary atherosclerosis 10/12/2006  . Asthma 10/12/2006  . GERD 10/12/2006  . Irritable bowel syndrome 10/12/2006    Marrian Bells, PT 09/20/2014, 9:28 AM  [facility] [address] [place], [place], [address] Phone: [phone]   Fax:  [phone]

## 2014-09-20 NOTE — Patient Instructions (Signed)
Single Leg Balance: Eyes Open   Stand on right leg with eyes open. Hold _15-20__ seconds. _3_ reps alternating sides, 1 time per day.  http://ggbe.exer.us/4   Copyright  VHI. All rights reserved.

## 2014-09-20 NOTE — Progress Notes (Addendum)
Patient ID: Joshua [name], male   DOB: 16-Sep-1940, 74 y.o.   MRN: [id number]   Subjective:    Patient ID: Joshua [name], male    DOB: 04-25-1940, 74 y.o.   MRN: [id number]  HPI Jahdiel is a pleasant 74 year old white male previously known to Dr. Sharlett Iles. He has history of diverticular disease, hypertension, coronary artery disease, asthma, and BPPV. He had undergone upper endoscopy in September 2010 showing a hiatal hernia and otherwise normal exam and also had colonoscopy at that same sign showing moderate diverticulosis throughout the colon, no polyps and was to have a 10 year interval follow-up. He comes in today after her recent episode of upper abdominal pain and constipation. He says he has had some problems with chronic constipation and has taken MiraLAX daily over the past several years. Upper abdominal pain across his upper abdomen earlier this month and said the pain was somewhat progressive. He apparently called here was unable to be seen and eventually went to the emergency room in [facility]. 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 He was noted to have numerous diverticuli but no definite diverticulitis. At that time he reports that he was told that he was constipated and to increase his MiraLAX to twice daily. He says he did that for a period of time was able to have more regular bowel movements and has gone back to [place] daily. His continued to have some abdominal bloating but no significant pain. He says his stools are somewhat loose, he has not noticed any blood.  Review of Systems Pertinent positive and negative review of systems were noted in the above HPI section.  All other review of systems was otherwise negative.  Outpatient Encounter Prescriptions as of 09/20/2014  Medication Sig  . acetaminophen (TYLENOL) 500 MG tablet Take 1,000 mg by mouth  every 6 (six) hours as needed for headache (pain).  [name] albuterol (PROAIR HFA) 108 (90 BASE) MCG/ACT inhaler Inhale 1-2 puffs into the lungs every 6 (six) hours as needed for wheezing or shortness of breath.  [name] aspirin EC 81 MG tablet Take 81 mg by mouth daily.  . beclomethasone (QVAR) 80 MCG/ACT inhaler Inhale 1 puff into the lungs 2 (two) times daily.  [name] BENICAR HCT 40-25 MG per tablet TAKE ONE TABLET BY MOUTH ONE TIME DAILY  . Cyanocobalamin (VITAMIN B-12) 500 MCG SUBL Place 500 mcg under the tongue daily.  [name] desloratadine (CLARINEX) 5 MG tablet Take 5 mg by mouth at bedtime.   . diazepam (VALIUM) 5 MG tablet Take 1 tablet (5 mg total) by mouth at bedtime as needed for anxiety (sleep).  . docusate sodium (COLACE) 100 MG capsule Take 1 capsule (100 mg total) by mouth every 12 (twelve) hours.  [name] FLOVENT HFA 110 MCG/ACT inhaler Inhale 2 puffs into the lungs 2 (two) times daily.  . fluticasone (FLONASE) 50 MCG/ACT nasal spray Place 1 spray into both nostrils 2 (two) times daily.   . folic acid (FOLVITE) 235 MCG tablet Take 400 mcg by mouth daily.  [name] gabapentin (NEURONTIN) 100 MG capsule Take 2 caps in AM, 1 cap in evening, 2 caps at bedtime  . LAC-HYDRIN FIVE 5 % LOTN lotion APPLY TO AFFECTED AREA(S) TWICE DAILY  . omeprazole (PRILOSEC) 20 MG capsule TAKE ONE CAPSULE BY MOUTH DAILY AS NEEDED FOR ACID REFLUX/HEARTBURN  . ondansetron (ZOFRAN) 8 MG tablet Take 8  mg by mouth every 8 (eight) hours as needed for nausea or vomiting.  . polyethylene glycol powder (GLYCOLAX/MIRALAX) powder Take 17 g by mouth daily.  . Probiotic Product (ALIGN) 4 MG CAPS Take 4 mg by mouth daily.   . simvastatin (ZOCOR) 20 MG tablet TAKE ONE TABLET BY MOUTH AT BEDTIME  . amoxicillin-clavulanate (AUGMENTIN) 875-125 MG per tablet Take 1 tablet by mouth 2 (two) times daily.  . ciprofloxacin (CIPRO) 500 MG tablet Take 1 tablet (500 mg total) by mouth 2 (two) times daily.  . metroNIDAZOLE (FLAGYL) 500 MG tablet Take 1 tablet (500  mg total) by mouth 2 (two) times daily.  . [DISCONTINUED] diazepam (VALIUM) 5 MG tablet TAKE ONE TABLET BY MOUTH AT BEDTIME AS NEEDED (Patient not taking: Reported on 09/13/2014)  . [DISCONTINUED] folic acid (FOLVITE) 1 MG tablet Take 1 mg by mouth daily.     No facility-administered encounter medications on file as of 09/20/2014.   Allergies  Allergen Reactions  . Azithromycin Other (See Comments)    Dizziness and double vision   . 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 Bromfed Nausea And Vomiting  . Clarithromycin Other (See Comments)     gastritis  . Codeine Other (See Comments)    Trouble breathing  . Doxycycline Hyclate Nausea And Vomiting  . Modafinil Other (See Comments)     anxiety-nervousness  . Promethazine Hcl Other (See Comments)     fainting  . Quinolones     Cipro  . Telithromycin Nausea And Vomiting  . Erythromycin Base Rash  . Hydrocodone-Acetaminophen Rash  . Sulfonamide Derivatives Rash   Patient Active Problem List   Diagnosis Date Noted  . Allergic rhinitis 05/31/2014  . Fatigue 11/09/2013  . CKD (chronic kidney disease), stage III 10/23/2013  . Dizziness 08/23/2013  . Neuropathy 08/21/2013  . RLS (restless legs syndrome) 01/03/2013  . Low back pain radiating to left leg 10/09/2011  . ADENOCARCINOMA, PROSTATE, GLEASON GRADE 6 04/01/2010  . OSTEOARTHRITIS, LOWER LEG, LEFT 10/04/2009  . TOBACCO ABUSE, HX OF 12/26/2008  . ANEMIA, IRON DEFICIENCY 11/06/2008  . 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 Essential hypertension 02/21/2007  . OSA (obstructive sleep apnea) 11/02/2006  . Hyperlipidemia 10/12/2006  . DEPRESSION 10/12/2006  . Coronary atherosclerosis 10/12/2006  . Asthma 10/12/2006  . GERD 10/12/2006  . Irritable bowel syndrome 10/12/2006   History   Social History  . Marital Status: Married    Spouse Name: N/A  . Number of Children: N/A  . Years of Education: N/A    Occupational History  . retired    Social History Main Topics  . Smoking status: Former Smoker -- 1.00 packs/day for 35 years    Types: Cigarettes    Quit date: 06/16/1992  . Smokeless tobacco: Never Used  . Alcohol Use: 2.4 oz/week    4 Shots of liquor per week     Comment: mixed drink  . Drug Use: No  . Sexual Activity: No   Other Topics Concern  . Not on file   Social History Narrative   Married 42 years in 2015. No kids (mumps at age 41)   Retired from [employer] in [employer] 2x a week   Hobbies: Chief Strategy Officer, movies and tv, staying active    Mr. Champney family history includes Heart disease in his father and mother.      Objective:    Filed Vitals:   09/20/14  1420  BP: 120/60  Pulse: 79    Physical Exam  well-developed older white male in no acute distress, pleasant blood pressure 120/60 pulse 79 height 5 foot 5 weight 204. HEENT ;nontraumatic normocephalic EOMI PERRLA sclera anicteric, Supple; no JVD, Cardiovascular; regular rate and rhythm with S1-S2 no murmur or gallop, Pulmonary; clear bilaterally, Abdomen; large soft bowel sounds are present he is tender in the left mid quadrant left lower quadrant is no guarding or rebound no palpable mass or hepatosplenomegaly bowel sounds are present, Rectal; exam not done, Ext; no clubbing cyanosis or edema skin warm and dry, Psych; mood and affect appropriate       Assessment & Plan:   #1 74 yo male with extensive diverticulosis and chronic constipation with recent upper abdominal pain- now with change in bowels with loose stool and left sided abdominal tenderness. Suspect he does have a smoldering diverticulitis #2 CAD  #3 asthma  #4 HTN #5 hx prostate cancer  Plan; Start Augmentin 875 mg po BID x 10 days  Continue daily probiotic  Continue Miralax daily  Follow up in office in 3 weeks- if no better  Consider repeat colonoscopy- pt wishes to establish with Dr. Lucille Passy  PA-C 09/20/2014   Cc: [name], MD  Addendum: Reviewed and agree with initial management. Jerene Bears, MD

## 2014-09-20 NOTE — Therapy (Signed)
[facility] [address] [place], [place], [address] Phone: [phone]   Fax:  [phone]  Patient Details  Name: Joshua [name] MRN: [id number] Date of Birth: 04-13-1940 Referring Provider:  No ref. provider found  Encounter Date: 09/20/2014   PHYSICAL THERAPY DISCHARGE SUMMARY  Visits from Start of Care: 3  Current functional level related to goals / functional outcomes:     PT Long Term Goals - 09/20/14 0913    PT LONG TERM GOAL #1   Title The patient will be indep with HEP for habituation, gaze x 1 viewing and high level balance activities.   Baseline Met on 09/20/2014   Time 4   Period Weeks   Status Achieved   PT LONG TERM GOAL #2   Title The patient will subjectively report 0/10 symptoms of dizziness with horizontal rolling (improved from 2/10).   Baseline Met on 09/20/2014   Time 4   Period Weeks   Status Achieved   PT LONG TERM GOAL #3   Title The patient will complete 360 degree turn safety to L and R sides wihtout loss of balance.   Baseline Met on 09/20/2014   Time 4   Period Weeks   Status Achieved   PT LONG TERM GOAL #4   Title The patient will return to prior wellness activities at gym and with walking program.   Baseline Met on 09/20/2014- changed routine (stopped body pump and cycle), doing cardio regularly, lifts some weights   Time 4   Period Weeks   Status Achieved        Remaining deficits: Occasional instability, PT educated patient re: neuropathy and recent h/o vertigo creating multi-sensory impairment.  Provided HEP to address.   Education / Equipment: HEP, community exercise for post d/c.  Plan: Patient agrees to discharge.  Patient goals were met. Patient is being discharged due to meeting the stated rehab goals.  ?????      Vernecia Umble , PT  09/20/2014, 9:31 AM  [facility] [address] [place], [place], [address] Phone: [phone]   Fax:  [phone]

## 2014-09-21 DIAGNOSIS — S76311D Strain of muscle, fascia and tendon of the posterior muscle group at thigh level, right thigh, subsequent encounter: Secondary | ICD-10-CM | POA: Diagnosis not present

## 2014-09-21 DIAGNOSIS — R262 Difficulty in walking, not elsewhere classified: Secondary | ICD-10-CM | POA: Diagnosis not present

## 2014-09-21 DIAGNOSIS — M25511 Pain in right shoulder: Secondary | ICD-10-CM | POA: Diagnosis not present

## 2014-09-25 ENCOUNTER — Encounter: Payer: Self-pay | Admitting: Neurology

## 2014-09-25 ENCOUNTER — Ambulatory Visit (INDEPENDENT_AMBULATORY_CARE_PROVIDER_SITE_OTHER): Payer: Medicare Other | Admitting: Neurology

## 2014-09-25 VITALS — BP 110/74 | HR 94 | Resp 16 | Ht 67.0 in | Wt 201.0 lb

## 2014-09-25 DIAGNOSIS — G609 Hereditary and idiopathic neuropathy, unspecified: Secondary | ICD-10-CM | POA: Diagnosis not present

## 2014-09-25 DIAGNOSIS — S76311D Strain of muscle, fascia and tendon of the posterior muscle group at thigh level, right thigh, subsequent encounter: Secondary | ICD-10-CM | POA: Diagnosis not present

## 2014-09-25 DIAGNOSIS — I25118 Atherosclerotic heart disease of native coronary artery with other forms of angina pectoris: Secondary | ICD-10-CM

## 2014-09-25 DIAGNOSIS — R42 Dizziness and giddiness: Secondary | ICD-10-CM

## 2014-09-25 DIAGNOSIS — M25511 Pain in right shoulder: Secondary | ICD-10-CM | POA: Diagnosis not present

## 2014-09-25 DIAGNOSIS — R262 Difficulty in walking, not elsewhere classified: Secondary | ICD-10-CM | POA: Diagnosis not present

## 2014-09-25 NOTE — Progress Notes (Signed)
NEUROLOGY FOLLOW UP OFFICE NOTE  [name] [id number]  HISTORY OF PRESENT ILLNESS: I had the pleasure of seeing Joshua [name] in follow-up in the neurology clinic on 09/25/2014.  The patient was last seen on 4 months ago for dizziness and neuropathy. The dizziness had resolved with Lexapro discontinuation. He mostly has mild dizziness and unsteadiness when fatigued, and continues with vestibular therapy, which has helped. He has a history of burning pain in both feet, as well as in the penile region since prostatectomy. Gabapentin dose was increased to 528m/day (200,100,2062m, which he feels has helped, but he worries about the cold weather worsening symptoms again. He tried reducing dose and noticed more pain. The penile pain comes and goes daily. He denies any falls. He expressed concern about cognitive processing. His favorite game is poker, but he has noticed recently that he is not doing well, he does not think through the sequences and is instead more emotional. He noticed these changes after heart surgery, but feels it has worsened.He denies getting lost driving, no missed bill payments or medications. His recent memory is not as good, but "not bad." He is more worried about his ability to think things through. He denies any daytime drowsiness on gabapentin. He denies any headaches, diplopia, focal weakness, neck/back pain, bowel/bladder dysfunction.  HPI: This is a very pleasant [age]o RH man with a history of hypertension, hyperlipidemia, B12 deficiency, and diagnosis of neuropathy, who initially presented with dizziness that started in June 2014. He described the dizziness as gait unsteadiness where he walks like he is drunk ("the wobbles"). He denies any true vertigo or lightheadedness. He feels like he shuffles, and was concerned after he fell off his truck last May due to unsteadiness. He felt his thinking was foggy and out of focus, with difficulty concentrating. He was having  these symptoms almost daily, especially when going to the bathroom. He does not feel dizzy when sitting or lying down. He asked for Lexapro to be discontinued because he felt drugged with "terrible anger dreams," and has been off the medication for 2 weeks with no further dizziness.   He has been diagnosed with neuropathy due to burning pain and pins and needles sensation in both feet that has been ongoing for the past 4-5 years. He started gabapentin in 2014, with some effect on 30067may but could not function on 600m88my. He has had a prostatectomy and has pain in the penile region. He has occasional bladder incontinence, neck and back pain. He reports frostbite in both feet at age 74. 77Laboratory Data: TSH, B12, ESR, SPEP/IFE normal.  PAST MEDICAL HISTORY: Past Medical History  Diagnosis Date  . Asthma   . CAD (coronary artery disease)     CABG 2001  . Depression   . GERD (gastroesophageal reflux disease)   . Hyperlipidemia   . IBS (irritable bowel syndrome)   . Acute medial meniscal tear   . Hypertension   . Anemia   . Diverticulosis   . Obesity   . Pancreatitis ~ 1980  . Acute sinusitis 12/14/2006    Qualifier: Diagnosis of  By: JenkArnoldo Morale JohnBalinda Quails Prostate cancer 04/2010  . 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 Pneumonia 1980's X 2  . OSA on CPAP   . History of blood transfusion 2001    "related to the bypass OR"  . DJD (degenerative joint disease)   . Arthritis     "  middle finger right hand; right knee; neck" (02/06/2014)  . History of gout     MEDICATIONS: Current Outpatient Prescriptions on File Prior to Visit  Medication Sig Dispense Refill  . acetaminophen (TYLENOL) 500 MG tablet Take 1,000 mg by mouth every 6 (six) hours as needed for headache (pain).    [name] amoxicillin-clavulanate (AUGMENTIN) 875-125 MG per tablet Take 1 tablet by mouth 2 (two) times daily. 20 tablet 0  . aspirin EC 81 MG tablet Take 81 mg by mouth daily.    .  beclomethasone (QVAR) 80 MCG/ACT inhaler Inhale 2 puffs into the lungs 2 (two) times daily.     [name] BENICAR HCT 40-25 MG per tablet TAKE ONE TABLET BY MOUTH ONE TIME DAILY 30 tablet 5  . desloratadine (CLARINEX) 5 MG tablet Take 5 mg by mouth at bedtime.     . diazepam (VALIUM) 5 MG tablet Take 1 tablet (5 mg total) by mouth at bedtime as needed for anxiety (sleep). 30 tablet 2  . FLOVENT HFA 110 MCG/ACT inhaler Inhale 2 puffs into the lungs 2 (two) times daily.  5  . fluticasone (FLONASE) 50 MCG/ACT nasal spray Place 1 spray into both nostrils 2 (two) times daily.     . folic acid (FOLVITE) 161 MCG tablet Take 400 mcg by mouth daily.    [name] gabapentin (NEURONTIN) 100 MG capsule Take 2 caps in AM, 1 cap in evening, 2 caps at bedtime 150 capsule 11  . LAC-HYDRIN FIVE 5 % LOTN lotion APPLY TO AFFECTED AREA(S) TWICE DAILY 120 mL 2  . metroNIDAZOLE (FLAGYL) 500 MG tablet Take 1 tablet (500 mg total) by mouth 2 (two) times daily. 28 tablet 0  . omeprazole (PRILOSEC) 20 MG capsule TAKE ONE CAPSULE BY MOUTH DAILY AS NEEDED FOR ACID REFLUX/HEARTBURN 90 capsule 2  . ondansetron (ZOFRAN) 8 MG tablet Take 8 mg by mouth every 8 (eight) hours as needed for nausea or vomiting.    . polyethylene glycol powder (GLYCOLAX/MIRALAX) powder Take 17 g by mouth daily. 255 g 0  . Probiotic Product (ALIGN) 4 MG CAPS Take 4 mg by mouth daily.     . simvastatin (ZOCOR) 20 MG tablet TAKE ONE TABLET BY MOUTH AT BEDTIME 30 tablet 5  . albuterol (PROAIR HFA) 108 (90 BASE) MCG/ACT inhaler Inhale 1-2 puffs into the lungs every 6 (six) hours as needed for wheezing or shortness of breath.     No current facility-administered medications on file prior to visit.    ALLERGIES: Allergies  Allergen Reactions  . Azithromycin Other (See Comments)    Dizziness and double vision   . 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 Bromfed Nausea And Vomiting  . Clarithromycin Other  (See Comments)     gastritis  . Codeine Other (See Comments)    Trouble breathing  . Doxycycline Hyclate Nausea And Vomiting  . Modafinil Other (See Comments)     anxiety-nervousness  . Promethazine Hcl Other (See Comments)     fainting  . Quinolones     Cipro  . Telithromycin Nausea And Vomiting  . Erythromycin Base Rash  . Hydrocodone-Acetaminophen Rash  . Sulfonamide Derivatives Rash    FAMILY HISTORY: Family History  Problem Relation Age of Onset  . Heart disease Father   . Heart disease Mother     SOCIAL HISTORY: History   Social History  . Marital Status: Married    Spouse Name: N/A  .  Number of Children: N/A  . Years of Education: N/A   Occupational History  . retired    Social History Main Topics  . Smoking status: Former Smoker -- 1.00 packs/day for 35 years    Types: Cigarettes    Quit date: 06/16/1992  . Smokeless tobacco: Never Used  . Alcohol Use: 2.4 oz/week    4 Shots of liquor per week     Comment: mixed drink  . Drug Use: No  . Sexual Activity: No   Other Topics Concern  . Not on file   Social History Narrative   Married 42 years in 2015. No kids (mumps at age 56)   Retired from [employer] in [employer] 2x a week   Hobbies: Chief Strategy Officer, movies and tv, staying active    REVIEW OF SYSTEMS: Constitutional: No fevers, chills, or sweats, no generalized fatigue, change in appetite Eyes: No visual changes, double vision, eye pain Ear, nose and throat: No hearing loss, ear pain, nasal congestion, sore throat Cardiovascular: No chest pain, palpitations Respiratory:  No shortness of breath at rest or with exertion, wheezes GastrointestinaI: No nausea, vomiting, diarrhea, abdominal pain, fecal incontinence Genitourinary:  No dysuria, urinary retention or frequency Musculoskeletal:  No neck pain, back pain Integumentary: No rash, pruritus, skin lesions Neurological: as above Psychiatric: No depression, insomnia, anxiety Endocrine: No  palpitations, fatigue, diaphoresis, mood swings, change in appetite, change in weight, increased thirst Hematologic/Lymphatic:  No anemia, purpura, petechiae. Allergic/Immunologic: no itchy/runny eyes, nasal congestion, recent allergic reactions, rashes  PHYSICAL EXAM: Filed Vitals:   09/25/14 1457  BP: 110/74  Pulse: 94  Resp: 16   General: No acute distress Head:  Normocephalic/atraumatic Neck: supple, no paraspinal tenderness, full range of motion Heart:  Regular rate and rhythm Lungs:  Clear to auscultation bilaterally Back: No paraspinal tenderness Skin/Extremities: No rash, no edema Neurological Exam: alert and oriented to person, place, and time. No aphasia or dysarthria. Fund of knowledge is appropriate.  Recent and remote memory are intact.  Attention and concentration are normal.    Able to name objects and repeat phrases.  Montreal Cognitive Assessment  09/25/2014  Visuospatial/ Executive (0/5) 5  Naming (0/3) 3  Attention: Read list of digits (0/2) 2  Attention: Read list of letters (0/1) 1  Attention: Serial 7 subtraction starting at 100 (0/3) 3  Language: Repeat phrase (0/2) 2  Language : Fluency (0/1) 1  Abstraction (0/2) 2  Delayed Recall (0/5) 3  Orientation (0/6) 6  Total 28  Adjusted Score (based on education) 28    Cranial nerves: Pupils equal, round, reactive to light.  Fundoscopic exam unremarkable, no papilledema. Extraocular movements intact with no nystagmus. Visual fields full. Facial sensation intact. No facial asymmetry. Tongue, uvula, palate midline.  Motor: Bulk and tone normal, muscle strength 5/5 throughout with no pronator drift.  Sensation: hyperesthesia to pin on both feet, decreased vibration to ankles bilaterally. No extinction to double simultaneous stimulation.  Deep tendon reflexes 2+ throughout, toes downgoing.  Finger to nose testing intact.  Gait narrow-based and steady, able to tandem walk adequately.  Romberg negative.  IMPRESSION: This  is a very pleasant 74 yo RH man with a history of hypertension, hyperlipidemia, who initially presented with dizziness that has resolved since Lexapro stopped. No further dizzy episodes. He has signs and symptoms of length-dependent peripheral neuropathy also causing sensation of imbalance, continue vestibular therapy. Neurontin helps with neuropathic pain, continue 265m in AM, 1044min PM, 20040mhs. He is worried  he may need a higher dose with the cold weather. He also expressed concern about cognitive processing, MOCA today normal 28/30. We discussed his symptoms, continue physical exercise and brain stimulation exercises. Medications may be contributing to cognition, we discussed benefits versus risks of medications. He would like to continue all his current medications at this time. He will follow-up in 4 months and knows to call our office fro any changes.   Thank you for allowing me to participate in his care.  Please do not hesitate to call for any questions or concerns.  The duration of this appointment visit was 25 minutes of face-to-face time with the patient.  Greater than 50% of this time was spent in counseling, explanation of diagnosis, planning of further management, and coordination of care.   Ellouise Newer, M.D.   CC: Dr. Yong Channel

## 2014-09-25 NOTE — Patient Instructions (Signed)
1. Continue all your medications 2. Physical exercise and brain stimulation exercises are important for brain health 3. Follow-up in 4 months, call for any changes

## 2014-09-27 DIAGNOSIS — M25511 Pain in right shoulder: Secondary | ICD-10-CM | POA: Diagnosis not present

## 2014-09-27 DIAGNOSIS — R262 Difficulty in walking, not elsewhere classified: Secondary | ICD-10-CM | POA: Diagnosis not present

## 2014-09-27 DIAGNOSIS — S76311D Strain of muscle, fascia and tendon of the posterior muscle group at thigh level, right thigh, subsequent encounter: Secondary | ICD-10-CM | POA: Diagnosis not present

## 2014-09-28 DIAGNOSIS — S76311A Strain of muscle, fascia and tendon of the posterior muscle group at thigh level, right thigh, initial encounter: Secondary | ICD-10-CM | POA: Diagnosis not present

## 2014-10-01 DIAGNOSIS — J3089 Other allergic rhinitis: Secondary | ICD-10-CM | POA: Diagnosis not present

## 2014-10-01 DIAGNOSIS — J3081 Allergic rhinitis due to animal (cat) (dog) hair and dander: Secondary | ICD-10-CM | POA: Diagnosis not present

## 2014-10-01 DIAGNOSIS — J301 Allergic rhinitis due to pollen: Secondary | ICD-10-CM | POA: Diagnosis not present

## 2014-10-02 DIAGNOSIS — R262 Difficulty in walking, not elsewhere classified: Secondary | ICD-10-CM | POA: Diagnosis not present

## 2014-10-02 DIAGNOSIS — M25511 Pain in right shoulder: Secondary | ICD-10-CM | POA: Diagnosis not present

## 2014-10-02 DIAGNOSIS — S76311D Strain of muscle, fascia and tendon of the posterior muscle group at thigh level, right thigh, subsequent encounter: Secondary | ICD-10-CM | POA: Diagnosis not present

## 2014-10-04 DIAGNOSIS — M25511 Pain in right shoulder: Secondary | ICD-10-CM | POA: Diagnosis not present

## 2014-10-04 DIAGNOSIS — R262 Difficulty in walking, not elsewhere classified: Secondary | ICD-10-CM | POA: Diagnosis not present

## 2014-10-04 DIAGNOSIS — S76311D Strain of muscle, fascia and tendon of the posterior muscle group at thigh level, right thigh, subsequent encounter: Secondary | ICD-10-CM | POA: Diagnosis not present

## 2014-10-09 ENCOUNTER — Other Ambulatory Visit: Payer: Self-pay | Admitting: Family Medicine

## 2014-10-09 DIAGNOSIS — S76311D Strain of muscle, fascia and tendon of the posterior muscle group at thigh level, right thigh, subsequent encounter: Secondary | ICD-10-CM | POA: Diagnosis not present

## 2014-10-09 DIAGNOSIS — M25511 Pain in right shoulder: Secondary | ICD-10-CM | POA: Diagnosis not present

## 2014-10-09 DIAGNOSIS — R262 Difficulty in walking, not elsewhere classified: Secondary | ICD-10-CM | POA: Diagnosis not present

## 2014-10-09 NOTE — Telephone Encounter (Signed)
Yes thanks 

## 2014-10-09 NOTE — Telephone Encounter (Signed)
Ok to fill 

## 2014-10-15 ENCOUNTER — Ambulatory Visit: Payer: Medicare Other | Admitting: Physician Assistant

## 2014-10-15 ENCOUNTER — Ambulatory Visit (INDEPENDENT_AMBULATORY_CARE_PROVIDER_SITE_OTHER): Payer: Medicare Other | Admitting: Family Medicine

## 2014-10-15 ENCOUNTER — Encounter: Payer: Self-pay | Admitting: Family Medicine

## 2014-10-15 VITALS — BP 110/82 | HR 90 | Temp 98.3°F | Wt 202.0 lb

## 2014-10-15 DIAGNOSIS — A499 Bacterial infection, unspecified: Secondary | ICD-10-CM

## 2014-10-15 DIAGNOSIS — J329 Chronic sinusitis, unspecified: Secondary | ICD-10-CM

## 2014-10-15 DIAGNOSIS — B9689 Other specified bacterial agents as the cause of diseases classified elsewhere: Secondary | ICD-10-CM

## 2014-10-15 MED ORDER — PREDNISONE 20 MG PO TABS: ORAL_TABLET | ORAL | Status: DC

## 2014-10-15 MED ORDER — AMOXICILLIN-POT CLAVULANATE 875-125 MG PO TABS: 1.0000 | ORAL_TABLET | Freq: Two times a day (BID) | ORAL | Status: DC

## 2014-10-15 NOTE — Progress Notes (Signed)
PCP: [name], MD  Subjective:  Joshua [name] is a 74 y.o. year old very pleasant male patient who presents with Cough, nasal congestion -Started about 10 days ago with tickle in throat and then a cough. Sinus pressure throughout this time with some yellow discharge. This was around the time of the change from qvar to flovent. Started as loose cough, then became dry, now back to productive of dark yellow mucus. Not sleeping well. Minimal wheeze but has had coughing fits. Feels somewhat tight in chest- no issues with walking down hall or up stairs-main issue here is that if he breaths deep, goes into coughing fit.. Feels tired and run down.   flovent does seem to be more irritating. Wonders if he can switch back to qvar, changed due to cost.   ROS-denies fever, SOB or wheeze, Nausea, vomiting,, tooth pain  Pertinent Past Medical History- Cad s/p CABG, IBS, asthma, CKD III  Medications- reviewed  Current Outpatient Prescriptions  Medication Sig Dispense Refill  . albuterol (PROAIR HFA) 108 (90 BASE) MCG/ACT inhaler Inhale 1-2 puffs into the lungs every 6 (six) hours as needed for wheezing or shortness of breath.    [name] aspirin EC 81 MG tablet Take 81 mg by mouth daily.    [name] BENICAR HCT 40-25 MG per tablet TAKE ONE TABLET BY MOUTH ONE TIME DAILY 30 tablet 5  . desloratadine (CLARINEX) 5 MG tablet Take 5 mg by mouth at bedtime.     [name] FLOVENT HFA 110 MCG/ACT inhaler Inhale 2 puffs into the lungs 2 (two) times daily.  5  . fluticasone (FLONASE) 50 MCG/ACT nasal spray Place 1 spray into both nostrils 2 (two) times daily.     [name] gabapentin (NEURONTIN) 100 MG capsule Take 2 caps in AM, 1 cap in evening, 2 caps at bedtime 150 capsule 11  . LAC-HYDRIN FIVE 5 % LOTN lotion APPLY TO AFFECTED AREA(S) TWICE DAILY 120 mL 2  . metroNIDAZOLE (FLAGYL) 500 MG tablet Take 1 tablet (500 mg total) by mouth 2 (two) times daily. 28 tablet 0  . omeprazole (PRILOSEC) 20 MG capsule TAKE ONE CAPSULE BY MOUTH  DAILY AS NEEDED FOR ACID REFLUX/HEARTBURN 90 capsule 2  . polyethylene glycol powder (GLYCOLAX/MIRALAX) powder Take 17 g by mouth daily. 255 g 0  . Probiotic Product (ALIGN) 4 MG CAPS Take 4 mg by mouth daily.     . simvastatin (ZOCOR) 20 MG tablet TAKE ONE TABLET BY MOUTH AT BEDTIME 30 tablet 5  . acetaminophen (TYLENOL) 500 MG tablet Take 1,000 mg by mouth every 6 (six) hours as needed for headache (pain).    . diazepam (VALIUM) 5 MG tablet Take 1 tablet (5 mg total) by mouth at bedtime as needed for anxiety (sleep). (Patient not taking: Reported on 10/15/2014) 30 tablet 2  . folic acid (FOLVITE) 353 MCG tablet Take 400 mcg by mouth daily.    . ondansetron (ZOFRAN) 8 MG tablet TAKE ONE TABLET BY MOUTH EVERY EIGHT HOURS AS NEEDED FOR NAUSEA (Patient not taking: Reported on 10/15/2014) 20 tablet 3  . traMADol (ULTRAM) 50 MG tablet 50 mg. 1 to 2 tablets twice daily as needed for pain  1   Objective: BP 110/82 mmHg  Pulse 90  Temp(Src) 98.3 F (36.8 C)  Wt 202 lb (91.627 kg)  SpO2 98% Gen: NAD, resting comfortably HEENT: Turbinates erythematous with yellow discharge, TM normal, pharynx mildly erythematous with no tonsilar exudate or edema, frontal sinus tenderness CV: RRR no murmurs rubs or  gallops Lungs: mild occasional wheeze but otherwise CTAB. Coughing fits with deep breaths.  Abdomen: soft/nontender/nondistended/normal bowel sounds.  Skin: warm, dry, no rash Neuro: grossly normal, moves all extremities   Assessment/Plan:  Bacterial Sinusitis-based on 10 days of persistent symptoms including sinus pressure/congestion. No obvious asthma flare but prednisone should give protection there in addition to helping with sinusitis. Multiple abx allergies and allergies to codeine and hydrocodone so cannot use for cough. Could consider tessalon pearls.   Prednisone 7 days  Augmentin 7 days  Follow up per AVS  Meds ordered this encounter  Medications  . amoxicillin-clavulanate (AUGMENTIN)  875-125 MG per tablet    Sig: Take 1 tablet by mouth 2 (two) times daily.    Dispense:  14 tablet    Refill:  0  . predniSONE (DELTASONE) 20 MG tablet    Sig: Take 2 pills for 3 days, 1 pill for 4 days,    Dispense:  10 tablet    Refill:  0

## 2014-10-15 NOTE — Patient Instructions (Signed)
Bacterial Sinusitis  Prednisone 7 days  Augmentin 7 days  Follow up if not improving by day 10 or sooner if new or worsening symptoms (fevers, chills, trouble breathing)

## 2014-10-16 ENCOUNTER — Ambulatory Visit: Payer: Medicare Other | Admitting: Physician Assistant

## 2014-10-18 ENCOUNTER — Telehealth: Payer: Self-pay | Admitting: Family Medicine

## 2014-10-18 NOTE — Telephone Encounter (Signed)
Pt request refill albuterol (PROAIR HFA) 108 (90 BASE) MCG/ACT inhaler Generic?  Pt is having a hard time w/ this weather and breathing.  [facility] [address]   Pt is getting toward the end of his rx money and would like to know if it Would be less expensive to take BENICAR HCT 80-50 MG per tablet  And splitting to make a  BENICAR HCT 40-25 MG per tablet Or is there another rx comparable to the benicar  Pt could try that is less $

## 2014-10-19 ENCOUNTER — Other Ambulatory Visit: Payer: Self-pay

## 2014-10-19 MED ORDER — ALBUTEROL SULFATE HFA 108 (90 BASE) MCG/ACT IN AERS: 1.0000 | INHALATION_SPRAY | Freq: Four times a day (QID) | RESPIRATORY_TRACT | Status: DC | PRN

## 2014-10-19 MED ORDER — VALSARTAN-HYDROCHLOROTHIAZIDE 320-25 MG PO TABS: 1.0000 | ORAL_TABLET | Freq: Every day | ORAL | Status: DC

## 2014-10-19 NOTE — Telephone Encounter (Signed)
There is no higher dose benicar.  Inhalers are almost always expensive.  I sent in valsartan- hctz to try in place of the benicar-hct. He can see if this is a better cost

## 2014-10-19 NOTE — Telephone Encounter (Signed)
Pt called and notified

## 2014-10-19 NOTE — Telephone Encounter (Signed)
albeuterol refilled, Dr. Yong Channel see end of message below.

## 2014-10-22 ENCOUNTER — Ambulatory Visit (INDEPENDENT_AMBULATORY_CARE_PROVIDER_SITE_OTHER): Payer: Medicare Other | Admitting: Family Medicine

## 2014-10-22 ENCOUNTER — Ambulatory Visit (INDEPENDENT_AMBULATORY_CARE_PROVIDER_SITE_OTHER)
Admission: RE | Admit: 2014-10-22 | Discharge: 2014-10-22 | Disposition: A | Discharge status: Home / Self Care | Payer: Medicare Other | Source: Ambulatory Visit | Attending: Family Medicine | Admitting: Family Medicine

## 2014-10-22 ENCOUNTER — Encounter: Payer: Self-pay | Admitting: Family Medicine

## 2014-10-22 VITALS — BP 132/80 | HR 79 | Temp 98.2°F | Wt 197.0 lb

## 2014-10-22 DIAGNOSIS — E871 Hypo-osmolality and hyponatremia: Secondary | ICD-10-CM | POA: Diagnosis not present

## 2014-10-22 DIAGNOSIS — I25118 Atherosclerotic heart disease of native coronary artery with other forms of angina pectoris: Secondary | ICD-10-CM

## 2014-10-22 DIAGNOSIS — R05 Cough: Secondary | ICD-10-CM

## 2014-10-22 DIAGNOSIS — R5383 Other fatigue: Secondary | ICD-10-CM

## 2014-10-22 DIAGNOSIS — R059 Cough, unspecified: Secondary | ICD-10-CM

## 2014-10-22 DIAGNOSIS — R110 Nausea: Secondary | ICD-10-CM

## 2014-10-22 LAB — CBC WITH DIFFERENTIAL/PLATELET
Basophils Absolute: 0 10*3/uL (ref 0.0–0.1)
Basophils Relative: 0.2 % (ref 0.0–3.0)
Eosinophils Absolute: 0.1 10*3/uL (ref 0.0–0.7)
Eosinophils Relative: 0.9 % (ref 0.0–5.0)
HCT: 46.5 % (ref 39.0–52.0)
Hemoglobin: 15.9 g/dL (ref 13.0–17.0)
Lymphocytes Relative: 12.1 % (ref 12.0–46.0)
Lymphs Abs: 1.3 10*3/uL (ref 0.7–4.0)
MCHC: 34.1 g/dL (ref 30.0–36.0)
MCV: 91.4 fl (ref 78.0–100.0)
Monocytes Absolute: 0.6 10*3/uL (ref 0.1–1.0)
Monocytes Relative: 5.5 % (ref 3.0–12.0)
Neutro Abs: 9 10*3/uL — ABNORMAL HIGH (ref 1.4–7.7)
Neutrophils Relative %: 81.3 % — ABNORMAL HIGH (ref 43.0–77.0)
Platelets: 273 10*3/uL (ref 150.0–400.0)
RBC: 5.09 Mil/uL (ref 4.22–5.81)
RDW: 13 % (ref 11.5–15.5)
WBC: 11.1 10*3/uL — ABNORMAL HIGH (ref 4.0–10.5)

## 2014-10-22 LAB — BASIC METABOLIC PANEL
BUN: 25 mg/dL — ABNORMAL HIGH (ref 6–23)
CO2: 29 mEq/L (ref 19–32)
Calcium: 9.6 mg/dL (ref 8.4–10.5)
Chloride: 93 mEq/L — ABNORMAL LOW (ref 96–112)
Creatinine, Ser: 1.64 mg/dL — ABNORMAL HIGH (ref 0.40–1.50)
GFR: 44.15 mL/min — ABNORMAL LOW (ref >60.00)
Glucose, Bld: 121 mg/dL — ABNORMAL HIGH (ref 70–99)
Potassium: 4.2 mEq/L (ref 3.5–5.1)
Sodium: 130 mEq/L — ABNORMAL LOW (ref 135–145)

## 2014-10-22 NOTE — Progress Notes (Signed)
Pre visit review using our clinic review tool, if applicable. No additional management support is needed unless otherwise documented below in the visit note. 

## 2014-10-22 NOTE — Progress Notes (Signed)
Subjective:    Patient ID: Joshua [name], male    DOB: Jan 31, 1941, 74 y.o.   MRN: [id number]  HPI Patient seen as a work in with several week history of nonspecific symptoms of malaise, poor appetite, nausea without vomiting. In addition, he's had some dry cough over the past few weeks. He was treated for possible acute sinusitis with Augmentin and prednisone recently but did not see any improvement. Has not had any fever or chills. Recent TSH which was normal. He has some chronic mild hyponatremia and has had tendencies toward hypokalemia in the past. Has chronic kidney disease with baseline creatinine around 1.3-1.4.  Recent CT abdomen and pelvis which was unremarkable for any acute findings. Denies any recent abdominal pain, chest pain, dysuria. No recent change of medication. His weight is down 5 pounds compared with last week which she had sugar still not eating as much.  Past Medical History  Diagnosis Date  . Asthma   . CAD (coronary artery disease)     CABG 2001  . Depression   . GERD (gastroesophageal reflux disease)   . Hyperlipidemia   . IBS (irritable bowel syndrome)   . Acute medial meniscal tear   . Hypertension   . Anemia   . Diverticulosis   . Obesity   . Pancreatitis ~ 1980  . Acute sinusitis 12/14/2006    Qualifier: Diagnosis of  By: Arnoldo Morale MD, Balinda Quails   . Prostate cancer 04/2010  . 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 Pneumonia 1980's X 2  . OSA on CPAP   . History of blood transfusion 2001    "related to the bypass OR"  . DJD (degenerative joint disease)   . Arthritis     "middle finger right hand; right knee; neck" (02/06/2014)  . History of gout    Past Surgical History  Procedure Laterality Date  . Thyroidectomy, partial  1980  . Anterior cervical decomp/discectomy fusion  05/26/06  . Knee arthroscopy Right 1992; 04/2008  . Robot assisted laparoscopic radical prostatectomy  04/2010  . Nasal septum surgery Right 2007  . Back  surgery    . Laparoscopic cholecystectomy  2007  . Cardiac catheterization  11/20/1999  . Coronary artery bypass graft  11/20/1999    SVG-RI1-RI2, SVG-OM, SVG-dRCA    reports that he quit smoking about 22 years ago. His smoking use included Cigarettes. He has a 35 pack-year smoking history. He has never used smokeless tobacco. He reports that he drinks about 2.4 oz of alcohol per week. He reports that he does not use illicit drugs. family history includes Heart disease in his father and mother. Allergies  Allergen Reactions  . Azithromycin Other (See Comments)    Dizziness and double vision   . 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 Bromfed Nausea And Vomiting  . Clarithromycin Other (See Comments)     gastritis  . Codeine Other (See Comments)    Trouble breathing  . Doxycycline Hyclate Nausea And Vomiting  . Modafinil Other (See Comments)     anxiety-nervousness  . Promethazine Hcl Other (See Comments)     fainting  . Quinolones     Cipro  . Telithromycin Nausea And Vomiting  . Erythromycin Base Rash  . Hydrocodone-Acetaminophen Rash  . Sulfonamide Derivatives Rash      Review of Systems  Constitutional: Positive for appetite change, fatigue and unexpected weight change.  Negative for fever and chills.  HENT: Negative for sore throat.   Eyes: Negative for visual disturbance.  Respiratory: Positive for cough. Negative for chest tightness.   Cardiovascular: Negative for chest pain, palpitations and leg swelling.  Gastrointestinal: Positive for nausea. Negative for vomiting, abdominal pain, diarrhea, constipation and blood in stool.  Genitourinary: Negative for dysuria.  Skin: Negative for rash.  Neurological: Negative for dizziness, syncope, weakness, light-headedness and headaches.  Hematological: Negative for adenopathy.       Objective:   Physical Exam  Constitutional: He is oriented to person, place, and  time. He appears well-developed and well-nourished.  HENT:  Right Ear: External ear normal.  Left Ear: External ear normal.  Mouth/Throat: Oropharynx is clear and moist.  Neck: Neck supple.  Cardiovascular: Normal rate and regular rhythm.   Pulmonary/Chest: Effort normal and breath sounds normal. No respiratory distress. He has no wheezes. He has no rales.  Abdominal: Soft. Bowel sounds are normal. He exhibits no distension and no mass. There is no tenderness. There is no rebound and no guarding.  Musculoskeletal: He exhibits no edema.  Lymphadenopathy:    He has no cervical adenopathy.  Neurological: He is alert and oriented to person, place, and time. No cranial nerve deficit.  Psychiatric: He has a normal mood and affect. His behavior is normal.          Assessment & Plan:  Patient presents with multiple nonspecific symptoms including malaise, nausea without vomiting, loss of appetite, cough. No fever and non-focal exam. He does have history of hyponatremia in the past and we need to make sure this has not worsened. Check CBC, basic metabolic panel, chest x-ray. Recent CT abdomen and pelvis as above unremarkable

## 2014-10-29 DIAGNOSIS — J3081 Allergic rhinitis due to animal (cat) (dog) hair and dander: Secondary | ICD-10-CM | POA: Diagnosis not present

## 2014-10-29 DIAGNOSIS — J3089 Other allergic rhinitis: Secondary | ICD-10-CM | POA: Diagnosis not present

## 2014-10-29 DIAGNOSIS — J301 Allergic rhinitis due to pollen: Secondary | ICD-10-CM | POA: Diagnosis not present

## 2014-10-31 ENCOUNTER — Encounter: Payer: Self-pay | Admitting: Gastroenterology

## 2014-11-01 DIAGNOSIS — J3081 Allergic rhinitis due to animal (cat) (dog) hair and dander: Secondary | ICD-10-CM | POA: Diagnosis not present

## 2014-11-02 ENCOUNTER — Telehealth: Payer: Self-pay | Admitting: Family Medicine

## 2014-11-02 ENCOUNTER — Encounter: Payer: Self-pay | Admitting: Family Medicine

## 2014-11-02 ENCOUNTER — Telehealth: Payer: PRIVATE HEALTH INSURANCE | Admitting: Nurse Practitioner

## 2014-11-02 ENCOUNTER — Ambulatory Visit (INDEPENDENT_AMBULATORY_CARE_PROVIDER_SITE_OTHER): Payer: Medicare Other | Admitting: Family Medicine

## 2014-11-02 VITALS — BP 124/72 | HR 87 | Temp 98.6°F | Wt 202.0 lb

## 2014-11-02 DIAGNOSIS — J0100 Acute maxillary sinusitis, unspecified: Secondary | ICD-10-CM | POA: Diagnosis not present

## 2014-11-02 DIAGNOSIS — I25118 Atherosclerotic heart disease of native coronary artery with other forms of angina pectoris: Secondary | ICD-10-CM

## 2014-11-02 DIAGNOSIS — J0101 Acute recurrent maxillary sinusitis: Secondary | ICD-10-CM

## 2014-11-02 MED ORDER — AMOXICILLIN-POT CLAVULANATE 875-125 MG PO TABS
1.0000 | ORAL_TABLET | Freq: Two times a day (BID) | ORAL | Status: DC
Start: 2014-11-02 — End: 2014-11-05

## 2014-11-02 MED ORDER — AMOXICILLIN-POT CLAVULANATE 875-125 MG PO TABS: 1.0000 | ORAL_TABLET | Freq: Two times a day (BID) | ORAL | Status: DC

## 2014-11-02 NOTE — Telephone Encounter (Signed)
Appointment scheduled for today 

## 2014-11-02 NOTE — Patient Instructions (Signed)
-  take antibiotic as instructed  -follow up if worsening, not improving as expected or other concerns

## 2014-11-02 NOTE — Progress Notes (Signed)
HPI:  Sinusitis: -started 3 days ago -he has had nasal congestion, PND, sinus congestion, sinus pressure for 5 weeks, that seemed to get a little better but did not resolve with a 7 day course of augmentin and prednisone finished about 1 week ago, then got worse the last 3 days with development of pain in L maxialary and L periorbital region and R upper tooth pain that worsens with leaning forward -hx of bad sinus infections, sinus surgery and some headaches but not usually like this -hx of many antibiotic allergies -denies: vision changes, fevers, vomiting, weakness, numbness, SOB, wheezing  ROS: See pertinent positives and negatives per HPI.  Past Medical History  Diagnosis Date  . Asthma   . CAD (coronary artery disease)     CABG 2001  . Depression   . GERD (gastroesophageal reflux disease)   . Hyperlipidemia   . IBS (irritable bowel syndrome)   . Acute medial meniscal tear   . Hypertension   . Anemia   . Diverticulosis   . Obesity   . Pancreatitis ~ 1980  . Acute sinusitis 12/14/2006    Qualifier: Diagnosis of  By: Arnoldo Morale MD, Balinda Quails   . Prostate cancer 04/2010  . 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 Pneumonia 1980's X 2  . OSA on CPAP   . History of blood transfusion 2001    "related to the bypass OR"  . DJD (degenerative joint disease)   . Arthritis     "middle finger right hand; right knee; neck" (02/06/2014)  . History of gout     Past Surgical History  Procedure Laterality Date  . Thyroidectomy, partial  1980  . Anterior cervical decomp/discectomy fusion  05/26/06  . Knee arthroscopy Right 1992; 04/2008  . Robot assisted laparoscopic radical prostatectomy  04/2010  . Nasal septum surgery Right 2007  . Back surgery    . Laparoscopic cholecystectomy  2007  . Cardiac catheterization  11/20/1999  . Coronary artery bypass graft  11/20/1999    SVG-RI1-RI2, SVG-OM, SVG-dRCA    Family History  Problem Relation Age of Onset  . Heart disease  Father   . Heart disease Mother     Social History   Social History  . Marital Status: Married    Spouse Name: N/A  . Number of Children: N/A  . Years of Education: N/A   Occupational History  . retired    Social History Main Topics  . Smoking status: Former Smoker -- 1.00 packs/day for 35 years    Types: Cigarettes    Quit date: 06/16/1992  . Smokeless tobacco: Never Used  . Alcohol Use: 2.4 oz/week    4 Shots of liquor per week     Comment: mixed drink  . Drug Use: No  . Sexual Activity: No   Other Topics Concern  . None   Social History Narrative   Married 42 years in 2015. No kids (mumps at age 89)   Retired from [employer] in [employer] 2x a week   Hobbies: poker, movies and tv, staying active     Current outpatient prescriptions:  .  aspirin EC 81 MG tablet, Take 81 mg by mouth daily., Disp: , Rfl:  .  beclomethasone (QVAR) 80 MCG/ACT inhaler, Inhale 2 puffs into the lungs 2 (two) times daily. , Disp: , Rfl:  .  BENICAR HCT 40-25 MG per tablet, TAKE ONE TABLET BY MOUTH ONE TIME DAILY, Disp: 30 tablet, Rfl:  5 .  desloratadine (CLARINEX) 5 MG tablet, Take 5 mg by mouth at bedtime. , Disp: , Rfl:  .  FLOVENT HFA 110 MCG/ACT inhaler, Inhale 2 puffs into the lungs 2 (two) times daily., Disp: , Rfl: 5 .  fluticasone (FLONASE) 50 MCG/ACT nasal spray, Place 1 spray into both nostrils 2 (two) times daily. , Disp: , Rfl:  .  folic acid (FOLVITE) 086 MCG tablet, Take 400 mcg by mouth daily., Disp: , Rfl:  .  gabapentin (NEURONTIN) 100 MG capsule, Take 2 caps in AM, 1 cap in evening, 2 caps at bedtime, Disp: 150 capsule, Rfl: 11 .  LAC-HYDRIN FIVE 5 % LOTN lotion, APPLY TO AFFECTED AREA(S) TWICE DAILY, Disp: 120 mL, Rfl: 2 .  omeprazole (PRILOSEC) 20 MG capsule, TAKE ONE CAPSULE BY MOUTH DAILY AS NEEDED FOR ACID REFLUX/HEARTBURN, Disp: 90 capsule, Rfl: 2 .  Probiotic Product (ALIGN) 4 MG CAPS, Take 4 mg by mouth daily. , Disp: , Rfl:  .  simvastatin (ZOCOR) 20 MG  tablet, TAKE ONE TABLET BY MOUTH AT BEDTIME, Disp: 30 tablet, Rfl: 5 .  valsartan-hydrochlorothiazide (DIOVAN-HCT) 320-25 MG per tablet, Take 1 tablet by mouth daily., Disp: 30 tablet, Rfl: 5 .  acetaminophen (TYLENOL) 500 MG tablet, Take 1,000 mg by mouth every 6 (six) hours as needed for headache (pain)., Disp: , Rfl:  .  albuterol (PROAIR HFA) 108 (90 BASE) MCG/ACT inhaler, Inhale 1-2 puffs into the lungs every 6 (six) hours as needed for wheezing or shortness of breath. (Patient not taking: Reported on 11/02/2014), Disp: 18 g, Rfl: 3 .  amoxicillin-clavulanate (AUGMENTIN) 875-125 MG per tablet, Take 1 tablet by mouth 2 (two) times daily., Disp: 20 tablet, Rfl: 0 .  diazepam (VALIUM) 5 MG tablet, Take 1 tablet (5 mg total) by mouth at bedtime as needed for anxiety (sleep). (Patient not taking: Reported on 11/02/2014), Disp: 30 tablet, Rfl: 2 .  ondansetron (ZOFRAN) 8 MG tablet, TAKE ONE TABLET BY MOUTH EVERY EIGHT HOURS AS NEEDED FOR NAUSEA (Patient not taking: Reported on 11/02/2014), Disp: 20 tablet, Rfl: 3 .  polyethylene glycol powder (GLYCOLAX/MIRALAX) powder, Take 17 g by mouth daily. (Patient not taking: Reported on 11/02/2014), Disp: 255 g, Rfl: 0 .  traMADol (ULTRAM) 50 MG tablet, 50 mg. 1 to 2 tablets twice daily as needed for pain, Disp: , Rfl: 1  EXAM:  Filed Vitals:   11/02/14 1553  BP: 124/72  Pulse: 87  Temp: 98.6 F (37 C)    Body mass index is 31.63 kg/(m^2).  GENERAL: vitals reviewed and listed above, alert, oriented, appears well hydrated and in no acute distress  HEENT: atraumatic, conjunttiva clear, no obvious abnormalities on inspection of external nose and ears, normal appearance of ear canals and TMs, thick white nasal congestion on the R, mild post oropharyngeal erythema with PND, no tonsillar edema or exudate, R maxillary sinus TTP, no temporal art TTP or bruit  NECK: no obvious masses on inspection, no bruit  LUNGS: clear to auscultation bilaterally, no wheezes,  rales or rhonchi, good air movement  CV: HRRR, no peripheral edema  MS: moves all extremities without noticeable abnormality  NEURO: CN II-XII grossly intact, finger to nose normal, gait normal, speech and thought processing grossly intact  PSYCH: pleasant and cooperative, no obvious depression or anxiety  ASSESSMENT AND PLAN:  Discussed the following assessment and plan:  Acute maxillary sinusitis, recurrence not specified  -we discussed possible serious and likely etiologies, workup and treatment, treatment risks and return precautions  with unresolved sinusitis most likely, he has a sig hx and many antibiotic allergies -after a lengthy discussion, Yates opted for extended course of augmentin, strict return precautions, see ENT if this does not resolve symptoms -of course, we advised Zymir  to return or notify a doctor immediately if symptoms worsen or persist or new concerns arise.   -Patient advised to return or notify a doctor immediately if symptoms worsen or persist or new concerns arise.  There are no Patient Instructions on file for this visit.   Joshua Benton R.

## 2014-11-02 NOTE — Telephone Encounter (Signed)
[facility] - [facility] Patient Name: Joshua [name] DOB: 1941/01/29 Initial Comment Caller states he has terrible headaches. Nurse Assessment Nurse: Martyn Ehrich, RN, Felicia Date/Time (Eastern Time): 11/02/2014 3:05:57 PM Confirm and document reason for call. If symptomatic, describe symptoms. ---Pt had been having bad HA's for 3 days. No fever. Persistent. Tylenol helps. R face hurts and recent sinusitis - finished antx for that 3 wks ago Has the patient traveled out of the country within the last 30 days? ---No Does the patient require triage? ---Yes Related visit to physician within the last 2 weeks? ---No Does the PT have any chronic conditions? (i.e. diabetes, asthma, etc.) ---Yes List chronic conditions. ---asthmaGuidelines Guideline Title Affirmed Question Affirmed Notes Sinus Pain or Congestion [1] Redness or swelling on the cheek, forehead or around the eye AND [2] no fever Final Disposition User See Physician within 4 Hours (or PCP triage) Martyn Ehrich, RN, Felicia Comments slight cough this am Pain is on R side of face and into head from upper teeth up into side of head - temporal pain level is 7 Referrals REFERRED TO PCP OFFICE Disagree/Comply: Comply Call Id: [id number]

## 2014-11-02 NOTE — Progress Notes (Signed)
We are sorry that you are not feeling well.  Here is how we plan to help!  Based on what you have shared with me it looks like you have sinusitis.  Sinusitis is inflammation and infection in the sinus cavities of the head.  Based on your presentation I believe you most likely have Acute Bacterial sinusitis.  This is an infection caused by bacteria and is treated with antibiotics.  I have prescribed Augmentin, an antibiotic in the penicillin family, one tablet twice daily with food, for 7 days.. You may use an oral decongestant such as Mucinex D or if you have glaucoma or high blood pressure use plain Mucinex.  Saline nasal sprays help and can safely be used as often as needed for congestion. If you develop worsening sinus pain, fever or notice severe headache and vision changes, or if symptoms are not better after completion of antibiotic, please schedule an appointment with a health care provider.  Sinus infections are not as easily transmitted as other respiratory infection, however we still recommend that you avoid close contact with loved ones, especially the very young and elderly.  Remember to wash your hands thoroughly throughout the day as this is the number one way to prevent the spread of infection!  Home Care:  Only take medications as instructed by your medical team.  Complete the entire course of an antibiotic.  Do not take these medications with alcohol.  A steam or ultrasonic humidifier can help congestion.  You can place a towel over your head and breathe in the steam from hot water coming from a faucet.  Avoid close contacts especially the very young and the elderly.  Cover your mouth when you cough or sneeze.  Always remember to wash your hands.  Get Help Right Away If:  You develop worsening fever or sinus pain.  You develop a severe head ache or visual changes.  Your symptoms persist after you have completed your treatment plan.  Make sure you  Understand these  instructions.  Will watch your condition.  Will get help right away if you are not doing well or get worse.  Your e-visit answers were reviewed by a board certified advanced clinical practitioner to complete your personal care plan.  Depending on the condition, your plan could have included both over the counter or prescription medications.  If there is a problem please reply  once you have received a response from your provider.  Your safety is important to us.  If you have drug allergies check your prescription carefully.    You can use MyChart to ask questions about today's visit, request a non-urgent call back, or ask for a work or school excuse.  You will get an e-mail in the next two days asking about your experience.  I hope that your e-visit has been valuable and will speed your recovery. Thank you for using e-visits.    

## 2014-11-03 ENCOUNTER — Telehealth: Payer: Self-pay | Admitting: Family Medicine

## 2014-11-03 NOTE — Telephone Encounter (Signed)
Pt called on call service.  Never received his augmentin that was supposed to be sent to [facility].  eRx manually called in.

## 2014-11-05 ENCOUNTER — Other Ambulatory Visit: Payer: Self-pay | Admitting: *Deleted

## 2014-11-05 ENCOUNTER — Other Ambulatory Visit: Payer: Self-pay

## 2014-11-05 MED ORDER — AMOXICILLIN-POT CLAVULANATE 875-125 MG PO TABS: 1.0000 | ORAL_TABLET | Freq: Two times a day (BID) | ORAL | Status: DC

## 2014-11-05 NOTE — Telephone Encounter (Signed)
Rx re-sent and the pt was informed via Mychart message.

## 2014-11-08 DIAGNOSIS — M7672 Peroneal tendinitis, left leg: Secondary | ICD-10-CM | POA: Diagnosis not present

## 2014-11-09 ENCOUNTER — Ambulatory Visit: Payer: Medicare Other | Admitting: Family Medicine

## 2014-11-12 DIAGNOSIS — J301 Allergic rhinitis due to pollen: Secondary | ICD-10-CM | POA: Diagnosis not present

## 2014-11-12 DIAGNOSIS — J3081 Allergic rhinitis due to animal (cat) (dog) hair and dander: Secondary | ICD-10-CM | POA: Diagnosis not present

## 2014-11-12 DIAGNOSIS — J3089 Other allergic rhinitis: Secondary | ICD-10-CM | POA: Diagnosis not present

## 2014-11-14 DIAGNOSIS — Z23 Encounter for immunization: Secondary | ICD-10-CM | POA: Diagnosis not present

## 2014-11-15 ENCOUNTER — Ambulatory Visit (INDEPENDENT_AMBULATORY_CARE_PROVIDER_SITE_OTHER): Payer: Medicare Other | Admitting: Cardiology

## 2014-11-15 ENCOUNTER — Encounter: Payer: Self-pay | Admitting: Cardiology

## 2014-11-15 VITALS — BP 118/78 | HR 80 | Ht 65.0 in | Wt 204.0 lb

## 2014-11-15 DIAGNOSIS — I25118 Atherosclerotic heart disease of native coronary artery with other forms of angina pectoris: Secondary | ICD-10-CM | POA: Diagnosis not present

## 2014-11-15 DIAGNOSIS — E785 Hyperlipidemia, unspecified: Secondary | ICD-10-CM | POA: Diagnosis not present

## 2014-11-15 DIAGNOSIS — M7672 Peroneal tendinitis, left leg: Secondary | ICD-10-CM | POA: Diagnosis not present

## 2014-11-15 NOTE — Patient Instructions (Signed)
Your physician wants you to follow-up in: 1 Year. You will receive a reminder letter in the mail two months in advance. If you don't receive a letter, please call our office to schedule the follow-up appointment.  Your physician recommends that you return for lab work Fasting Lipids

## 2014-11-15 NOTE — Progress Notes (Signed)
HPI  The patient returns for follow up .  Since I last saw him he has had no new cardiac complaints.  He has had sinusitis, some GI issues and is now having some as yet unexplained pain in his left foot and he is in a walking cast. However, he's had no cardiac complaints.  The patient denies any new symptoms such as chest discomfort, neck or arm discomfort. There has been no new shortness of breath, PND or orthopnea. There have been no reported palpitations, presyncope or syncope. He has not been particularly active with his foot pain but he still delivers flowers.     Allergies  Allergen Reactions  . Azithromycin Other (See Comments)    Dizziness and double vision   . 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 Bromfed Nausea And Vomiting  . Clarithromycin Other (See Comments)     gastritis  . Codeine Other (See Comments)    Trouble breathing  . Doxycycline Hyclate Nausea And Vomiting  . Modafinil Other (See Comments)     anxiety-nervousness  . Promethazine Hcl Other (See Comments)     fainting  . Quinolones     Cipro  . Telithromycin Nausea And Vomiting  . Erythromycin Base Rash  . Hydrocodone-Acetaminophen Rash  . Sulfonamide Derivatives Rash    Current Outpatient Prescriptions  Medication Sig Dispense Refill  . acetaminophen (TYLENOL) 500 MG tablet Take 1,000 mg by mouth every 6 (six) hours as needed for headache (pain).    [name] albuterol (PROAIR HFA) 108 (90 BASE) MCG/ACT inhaler Inhale 1-2 puffs into the lungs every 6 (six) hours as needed for wheezing or shortness of breath. 18 g 3  . aspirin EC 81 MG tablet Take 81 mg by mouth daily.    [name] BENICAR HCT 40-25 MG per tablet TAKE ONE TABLET BY MOUTH ONE TIME DAILY 30 tablet 5  . Cyanocobalamin (VITAMIN B 12 PO) Take 800 mg by mouth daily.    [name] desloratadine (CLARINEX) 5 MG tablet Take 5 mg by mouth at bedtime.     . diazepam (VALIUM) 5 MG tablet Take 1 tablet (5 mg  total) by mouth at bedtime as needed for anxiety (sleep). 30 tablet 2  . FLOVENT HFA 110 MCG/ACT inhaler Inhale 2 puffs into the lungs 2 (two) times daily.  5  . fluticasone (FLONASE) 50 MCG/ACT nasal spray Place 1 spray into both nostrils 2 (two) times daily.     . folic acid (FOLVITE) 283 MCG tablet Take 400 mcg by mouth daily.    [name] gabapentin (NEURONTIN) 100 MG capsule Take 2 caps in AM, 1 cap in evening, 2 caps at bedtime 150 capsule 11  . LAC-HYDRIN FIVE 5 % LOTN lotion APPLY TO AFFECTED AREA(S) TWICE DAILY 120 mL 2  . omeprazole (PRILOSEC) 20 MG capsule TAKE ONE CAPSULE BY MOUTH DAILY AS NEEDED FOR ACID REFLUX/HEARTBURN 90 capsule 2  . ondansetron (ZOFRAN) 8 MG tablet TAKE ONE TABLET BY MOUTH EVERY EIGHT HOURS AS NEEDED FOR NAUSEA 20 tablet 3  . polyethylene glycol powder (GLYCOLAX/MIRALAX) powder Take 17 g by mouth daily. 255 g 0  . Probiotic Product (ALIGN) 4 MG CAPS Take 4 mg by mouth daily.     . simvastatin (ZOCOR) 20 MG tablet TAKE ONE TABLET BY MOUTH AT BEDTIME 30 tablet 5  . traMADol (ULTRAM) 50 MG tablet 50 mg. 1 to 2 tablets twice daily as needed for pain  1  . valsartan-hydrochlorothiazide (DIOVAN-HCT) 320-25 MG per tablet Take 1 tablet by mouth daily. 30 tablet 5   No current facility-administered medications for this visit.    Past Medical History  Diagnosis Date  . Asthma   . CAD (coronary artery disease)     CABG 2001  . Depression   . GERD (gastroesophageal reflux disease)   . Hyperlipidemia   . IBS (irritable bowel syndrome)   . Acute medial meniscal tear   . Hypertension   . Anemia   . Diverticulosis   . Obesity   . Pancreatitis ~ 1980  . Acute sinusitis 12/14/2006    Qualifier: Diagnosis of  By: Arnoldo Morale MD, Balinda Quails   . Prostate cancer 04/2010  . 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 Pneumonia 1980's X 2  . OSA on CPAP   . History of blood transfusion 2001    "related to the bypass OR"  . DJD (degenerative joint disease)   .  Arthritis     "middle finger right hand; right knee; neck" (02/06/2014)  . History of gout     Past Surgical History  Procedure Laterality Date  . Thyroidectomy, partial  1980  . Anterior cervical decomp/discectomy fusion  05/26/06  . Knee arthroscopy Right 1992; 04/2008  . Robot assisted laparoscopic radical prostatectomy  04/2010  . Nasal septum surgery Right 2007  . Back surgery    . Laparoscopic cholecystectomy  2007  . Cardiac catheterization  11/20/1999  . Coronary artery bypass graft  11/20/1999    SVG-RI1-RI2, SVG-OM, SVG-dRCA    ROS: As stated in the HPI and negative for all other systems.   PHYSICAL EXAM BP 118/78 mmHg  Pulse 80  Ht 5\' 5"  (1.651 m)  Wt 204 lb (92.534 kg)  BMI 33.95 kg/m2 GENERAL:  Well appearing NECK:  No jugular venous distention, waveform within normal limits, carotid upstroke brisk and symmetric, no bruits, no thyromegaly LUNGS:  Clear to auscultation bilaterally CHEST:  Well healed sternotomy scar. HEART:  PMI not displaced or sustained,S1 and S2 within normal limits, no S3, no S4, no clicks, no rubs, no murmurs ABD:  Flat, positive bowel sounds normal in frequency in pitch, no bruits, no rebound, no guarding, no midline pulsatile mass, no hepatomegaly, no splenomegaly EXT:  2 plus pulses throughout, no edema, no cyanosis no clubbing  EKG:  Sinus rhythm, rate 80, axis within normal limits, intervals within normal limits, no acute ST-T wave changes.  11/15/2014  ASSESSMENT AND PLAN  CORONARY ARTERY DISEASE -  He has had no new symptoms since his last stress test. No change in therapy is indicated.  HTN - The blood pressure is at target. No change in medications is indicated. We will continue with therapeutic lifestyle changes (TLC).  DYSLIPIDEMIA - I will order a lipid profile.

## 2014-11-19 ENCOUNTER — Ambulatory Visit (INDEPENDENT_AMBULATORY_CARE_PROVIDER_SITE_OTHER): Payer: Medicare Other | Admitting: Family Medicine

## 2014-11-19 ENCOUNTER — Other Ambulatory Visit (INDEPENDENT_AMBULATORY_CARE_PROVIDER_SITE_OTHER): Payer: Medicare Other

## 2014-11-19 ENCOUNTER — Encounter: Payer: Self-pay | Admitting: Family Medicine

## 2014-11-19 VITALS — BP 130/80 | HR 77 | Temp 98.0°F | Wt 203.0 lb

## 2014-11-19 DIAGNOSIS — E785 Hyperlipidemia, unspecified: Secondary | ICD-10-CM

## 2014-11-19 DIAGNOSIS — K6289 Other specified diseases of anus and rectum: Secondary | ICD-10-CM

## 2014-11-19 DIAGNOSIS — K625 Hemorrhage of anus and rectum: Secondary | ICD-10-CM

## 2014-11-19 DIAGNOSIS — I10 Essential (primary) hypertension: Secondary | ICD-10-CM | POA: Diagnosis not present

## 2014-11-19 LAB — CBC WITH DIFFERENTIAL/PLATELET
Basophils Absolute: 0 10*3/uL (ref 0.0–0.1)
Basophils Relative: 0.6 % (ref 0.0–3.0)
Eosinophils Absolute: 0.3 10*3/uL (ref 0.0–0.7)
Eosinophils Relative: 4.7 % (ref 0.0–5.0)
HCT: 44.8 % (ref 39.0–52.0)
Hemoglobin: 15.3 g/dL (ref 13.0–17.0)
Lymphocytes Relative: 20.9 % (ref 12.0–46.0)
Lymphs Abs: 1.5 10*3/uL (ref 0.7–4.0)
MCHC: 34.1 g/dL (ref 30.0–36.0)
MCV: 90.1 fl (ref 78.0–100.0)
Monocytes Absolute: 0.9 10*3/uL (ref 0.1–1.0)
Monocytes Relative: 11.9 % (ref 3.0–12.0)
Neutro Abs: 4.4 10*3/uL (ref 1.4–7.7)
Neutrophils Relative %: 61.9 % (ref 43.0–77.0)
Platelets: 264 10*3/uL (ref 150.0–400.0)
RBC: 4.98 Mil/uL (ref 4.22–5.81)
RDW: 12.7 % (ref 11.5–15.5)
WBC: 7.2 10*3/uL (ref 4.0–10.5)

## 2014-11-19 LAB — BASIC METABOLIC PANEL
BUN: 20 mg/dL (ref 6–23)
CO2: 30 mEq/L (ref 19–32)
Calcium: 9.9 mg/dL (ref 8.4–10.5)
Chloride: 99 mEq/L (ref 96–112)
Creatinine, Ser: 1.32 mg/dL (ref 0.40–1.50)
GFR: 56.7 mL/min — ABNORMAL LOW (ref >60.00)
Glucose, Bld: 98 mg/dL (ref 70–99)
Potassium: 4.1 mEq/L (ref 3.5–5.1)
Sodium: 137 mEq/L (ref 135–145)

## 2014-11-19 LAB — LIPID PANEL
Cholesterol: 168 mg/dL (ref 0–200)
HDL: 40.7 mg/dL (ref >39.00)
LDL Cholesterol: 95 mg/dL (ref 0–99)
NonHDL: 127.04
Total CHOL/HDL Ratio: 4
Triglycerides: 158 mg/dL — ABNORMAL HIGH (ref 0.0–149.0)
VLDL: 31.6 mg/dL (ref 0.0–40.0)

## 2014-11-19 NOTE — Patient Instructions (Signed)
Given your pain, suspect this is hemorrhoid related. I would use preparation H as instructed on box to try to calm this down. If still having issues by next week or if not continuing to improve see me sooner. This seems like minor bleeding and you are up to date on colonoscopy so we opted not to investigate further.   You are medically fit for travel still at this point including a cruise, this is a minor ailment.

## 2014-11-19 NOTE — Addendum Note (Signed)
Addended by: [name] on: 11/19/2014 05:44 PM   Modules accepted: Level of Service

## 2014-11-19 NOTE — Progress Notes (Addendum)
[name], MD  Subjective:  Joshua [name] is a 74 y.o. year old very pleasant male patient who presents with:  Rectal bleeding and pain -Started 3 days ago. Bleeding after bowel movements. Patient with history of IBS constipation predominant. He has had pain with bowel movements that tend to resolve with time. Rates pain as severe. Describes aching. His pain with bowel movements not improving. His bleeding has really slowed down. Bright red worse with wiping. Notes some when he comes back and wipes again. He is tried topical over-the-counter is not sure which project exactly. He had colonoscopy in 2010 that showed diverticulosis but no cancers or precancerous lesions. Bowel movements up in 1-2 times a day.  ROS- no chest pain or shortness breath. No fatigue worse than normal . No nausea or vomiting. No fever  Past Medical History- IBS constipation predominant, coronary atherosclerosis, neuropathy, hyperlipidemia, hypertension  Medications- reviewed and updated Current Outpatient Prescriptions  Medication Sig Dispense Refill  . aspirin EC 81 MG tablet Take 81 mg by mouth daily.    [name] BENICAR HCT 40-25 MG per tablet TAKE ONE TABLET BY MOUTH ONE TIME DAILY 30 tablet 5  . Cyanocobalamin (VITAMIN B 12 PO) Take 800 mg by mouth daily.    [name] desloratadine (CLARINEX) 5 MG tablet Take 5 mg by mouth at bedtime.     . fluticasone (FLONASE) 50 MCG/ACT nasal spray Place 1 spray into both nostrils 2 (two) times daily.     . folic acid (FOLVITE) 973 MCG tablet Take 400 mcg by mouth daily.    [name] gabapentin (NEURONTIN) 100 MG capsule Take 2 caps in AM, 1 cap in evening, 2 caps at bedtime 150 capsule 11  . LAC-HYDRIN FIVE 5 % LOTN lotion APPLY TO AFFECTED AREA(S) TWICE DAILY 120 mL 2  . omeprazole (PRILOSEC) 20 MG capsule TAKE ONE CAPSULE BY MOUTH DAILY AS NEEDED FOR ACID REFLUX/HEARTBURN 90 capsule 2  . polyethylene glycol powder (GLYCOLAX/MIRALAX) powder Take 17 g by mouth daily. 255 g 0  . Probiotic  Product (ALIGN) 4 MG CAPS Take 4 mg by mouth daily.     . simvastatin (ZOCOR) 20 MG tablet TAKE ONE TABLET BY MOUTH AT BEDTIME 30 tablet 5  . valsartan-hydrochlorothiazide (DIOVAN-HCT) 320-25 MG per tablet Take 1 tablet by mouth daily. 30 tablet 5  . acetaminophen (TYLENOL) 500 MG tablet Take 1,000 mg by mouth every 6 (six) hours as needed for headache (pain).    [name] albuterol (PROAIR HFA) 108 (90 BASE) MCG/ACT inhaler Inhale 1-2 puffs into the lungs every 6 (six) hours as needed for wheezing or shortness of breath. (Patient not taking: Reported on 11/19/2014) 18 g 3  . diazepam (VALIUM) 5 MG tablet Take 1 tablet (5 mg total) by mouth at bedtime as needed for anxiety (sleep). (Patient not taking: Reported on 11/19/2014) 30 tablet 2  . FLOVENT HFA 110 MCG/ACT inhaler Inhale 2 puffs into the lungs 2 (two) times daily.  5  . traMADol (ULTRAM) 50 MG tablet 50 mg. 1 to 2 tablets twice daily as needed for pain  1   Objective: BP 130/80 mmHg  Pulse 77  Temp(Src) 98 F (36.7 C)  Wt 203 lb (92.08 kg) Gen: NAD, resting comfortably CV: RRR no murmurs rubs or gallops Lungs: CTAB no crackles, wheeze, rhonchi Abdomen: soft/nontender/nondistended/normal bowel sounds. No rebound or guarding.  Rectal: small hemorrhoid tag about 6 o clock. Patient very tender to rectal exam at 12 o clock- no fissure, slight bulge.  Ext:  no edema Skin: warm, dry, no rash Neuro: grossly normal, moves all extremities  Assessment/Plan:  Rectal bleeding Rectal pain History would concern me for external hemorrhoid but none present on exam. Does have pain with internal exam and some bulge and possible internal hemorrhoid- possible this is protruding and coming painful though typically internal hemorrhoids do not cause pain. Finger was not bloody after exam. No obvious rectal fissure but that is a possibility. Patient will trial preparation H for pain portion. Bleeding is slowing and up to date on colonoscopy with no precancerous or  cancerous lesions 2010- bleed could be diverticulosis related but should not cause pain. No systemic signs of blood loss. Hgb today showed stable >14 so no significant losses. If this does not continue to slow or resolve by next week, consider having patient in with GI. Could also consider anoscopy but pain was severe with finger alone and not sure patient would tolerate. Return precautions discussed

## 2014-11-20 ENCOUNTER — Encounter: Payer: Self-pay | Admitting: Family Medicine

## 2014-11-21 DIAGNOSIS — M7672 Peroneal tendinitis, left leg: Secondary | ICD-10-CM | POA: Diagnosis not present

## 2014-11-27 ENCOUNTER — Other Ambulatory Visit: Payer: Self-pay | Admitting: Family Medicine

## 2014-11-27 ENCOUNTER — Telehealth: Payer: Self-pay | Admitting: Family Medicine

## 2014-11-27 ENCOUNTER — Encounter: Payer: Self-pay | Admitting: Neurology

## 2014-11-27 DIAGNOSIS — K6289 Other specified diseases of anus and rectum: Secondary | ICD-10-CM

## 2014-11-27 DIAGNOSIS — J3081 Allergic rhinitis due to animal (cat) (dog) hair and dander: Secondary | ICD-10-CM | POA: Diagnosis not present

## 2014-11-27 DIAGNOSIS — J3089 Other allergic rhinitis: Secondary | ICD-10-CM | POA: Diagnosis not present

## 2014-11-27 DIAGNOSIS — J301 Allergic rhinitis due to pollen: Secondary | ICD-10-CM | POA: Diagnosis not present

## 2014-11-27 NOTE — Telephone Encounter (Signed)
Referral entered  

## 2014-11-27 NOTE — Telephone Encounter (Signed)
Do you remember the two names of the GI docs you gave pt ? I told pt [facility] is who you recommend but he states he wants to see either of the two docs you mentioned to him, didn't see any names documented.

## 2014-11-27 NOTE — Telephone Encounter (Signed)
Pt states he was seen last week for rectal pain with bleeding.  He is calling back to report that he is no longer experiencing the bleeding but is experiencing severe pain.  Pt is requesting referral to specialist.  Pt mentioned he will be out of town 9/8-9/12 and will not be available during this time.

## 2014-11-27 NOTE — Telephone Encounter (Signed)
Dr. Earlean Shawl. Thanks. May refer

## 2014-11-27 NOTE — Telephone Encounter (Signed)
Pt states dr hunter has 2 gi specialist he would like pt to see one of them. I do know see a particular gi specialist in pt chart.

## 2014-11-27 NOTE — Telephone Encounter (Signed)
Reentered referral for pt to see Dr. Earlean Shawl.

## 2014-12-04 ENCOUNTER — Ambulatory Visit: Payer: Medicare Other | Admitting: Gastroenterology

## 2014-12-06 ENCOUNTER — Encounter: Payer: Self-pay | Admitting: Gastroenterology

## 2014-12-06 ENCOUNTER — Ambulatory Visit: Payer: PRIVATE HEALTH INSURANCE | Admitting: Family Medicine

## 2014-12-06 ENCOUNTER — Ambulatory Visit (INDEPENDENT_AMBULATORY_CARE_PROVIDER_SITE_OTHER): Payer: Medicare Other | Admitting: Gastroenterology

## 2014-12-06 VITALS — BP 120/70 | HR 68 | Ht 65.0 in | Wt 205.0 lb

## 2014-12-06 DIAGNOSIS — K5900 Constipation, unspecified: Secondary | ICD-10-CM | POA: Diagnosis not present

## 2014-12-06 DIAGNOSIS — K602 Anal fissure, unspecified: Secondary | ICD-10-CM | POA: Diagnosis not present

## 2014-12-06 DIAGNOSIS — K625 Hemorrhage of anus and rectum: Secondary | ICD-10-CM | POA: Diagnosis not present

## 2014-12-06 DIAGNOSIS — I25118 Atherosclerotic heart disease of native coronary artery with other forms of angina pectoris: Secondary | ICD-10-CM | POA: Diagnosis not present

## 2014-12-06 MED ORDER — DILTIAZEM GEL 2 %: CUTANEOUS | Status: DC

## 2014-12-06 NOTE — Progress Notes (Addendum)
     12/06/2014 Joshua [name] [id number] 17-Oct-1940   History of Present Illness:  This is a 74 year old male who is previously known to Dr. Sharlett Iles; his care transferred to Dr. Hilarie Fredrickson. He has history of diverticular disease, hypertension, coronary artery disease, asthma, and BPPV. He had undergone upper endoscopy in September 2010 showing a hiatal hernia and otherwise normal exam and also had colonoscopy at that same time showing moderate diverticulosis throughout the colon, no polyps and was to have a 10 year interval follow-up.  Was seen in June for constipation and was treated for diverticulitis.  Takes Miralax and Benefiber daily along with Align probiotic.  Comes to our office today with complaints of rectal/anal pain and bleeding.  Bright red blood on TP with BM's and pain with BM's since approximately 9/1.  Has been using preparation H with mild improvement.  Still feels like he has incomplete evacuation at times.  TSH normal along with CBC.   Current Medications, Allergies, Past Medical History, Past Surgical History, Family History and Social History were reviewed in [employer] record.   Physical Exam: BP 120/70 mmHg  Pulse 68  Ht 5\' 5"  (1.651 m)  Wt 205 lb (92.987 kg)  BMI 34.11 kg/m2 General: Well developed white male in no acute distress Head: Normocephalic and atraumatic Eyes:  Sclerae anicteric, conjunctiva pink  Ears: Normal auditory acuity Lungs: Clear throughout to auscultation Heart: Regular rate and rhythm Abdomen: Soft, non-distended.  Normal bowel sounds.  Small umbilical hernia noted. Rectal:  A couple of small skin tags noted but no external hemorrhoids.  Anal fissure noted anteriorly with tenderness on exam.  DRE did not reveal any masses, but was limited due to pain.  Stool was light brown and hemoccult negative. Musculoskeletal: Symmetrical with no gross deformities  Extremities: No edema  Neurological: Alert oriented x 4,  grossly non-focal Psychological:  Alert and cooperative. Normal mood and affect  Assessment and Recommendations: -Anal fissure:  Diltiazem gel 2% TID for 6 weeks.  Bowel regimen and rectal care instructions including sitz baths, non-aggressive hygiene, etc discussed. -Constipation:  Will increase Miralax to twice daily and continue Benefiber once a day.   Addendum: Reviewed and agree with initial management. Pt advised to follow-up to ensure improvement/resolution of symptoms Jerene Bears, MD

## 2014-12-06 NOTE — Patient Instructions (Signed)
We have sent the following medications to your pharmacy for you to pick up at your convenience: Diltiazem 2% Gel rectally three times a day.   Increase Miralax to twice a day.   Use Reticare lidocaine (over the counter).  You've been given a handout on rectal care instructions.

## 2014-12-09 ENCOUNTER — Encounter: Payer: Self-pay | Admitting: Neurology

## 2014-12-10 DIAGNOSIS — J3089 Other allergic rhinitis: Secondary | ICD-10-CM | POA: Diagnosis not present

## 2014-12-10 DIAGNOSIS — J3081 Allergic rhinitis due to animal (cat) (dog) hair and dander: Secondary | ICD-10-CM | POA: Diagnosis not present

## 2014-12-10 DIAGNOSIS — J301 Allergic rhinitis due to pollen: Secondary | ICD-10-CM | POA: Diagnosis not present

## 2014-12-10 NOTE — Telephone Encounter (Signed)
Pt called back concerning med/ Gabapentin/ should he increase the doseage?//call back @ home# [phone]

## 2014-12-11 ENCOUNTER — Telehealth: Payer: Self-pay | Admitting: Family Medicine

## 2014-12-11 NOTE — Telephone Encounter (Signed)
Please find out what side effects he is experiencing and what dose of gabapentin he is taking. If he is not having any side effects, we can increase it. He may always reduce the medication, if he gets too sleepy on it.         ----- Message -----     From: Thurmon Fair, CMA     Sent: 12/10/2014  8:33 AM      To: Cameron Sprang, MD    Subject: FW: Non-Urgent Medical Question                       ----- Message -----     From: Joshua [name]     Sent: 12/09/2014  9:12 AM      To: [facility] Clinical Pool    Subject: Non-Urgent Medical Question                 Hello [name],    Sorry it has taken so long for me to get back to you. I am afraid of the side effects of increasing the Gabapentin. I have not increased the meds. yet.     Should I be worried about the side effects. Just put up with the pain?    Thank you    Joshua [name]     [name] I spoke with him. He states that he's not having any side effects now from the doses he currently takes which is 100 mg 2 caps in the morning, 1 cap in the evening, and 2 caps at bedtime. His worry was if it was increased he didn't want to have side effects from the increase. He states that right now he wants to stay on the doses that he's been taking, unless he gets to a point to where he "just can't take it anymore".

## 2014-12-12 NOTE — Telephone Encounter (Signed)
Noted, thanks!

## 2014-12-17 DIAGNOSIS — J3081 Allergic rhinitis due to animal (cat) (dog) hair and dander: Secondary | ICD-10-CM | POA: Diagnosis not present

## 2014-12-17 DIAGNOSIS — J3089 Other allergic rhinitis: Secondary | ICD-10-CM | POA: Diagnosis not present

## 2014-12-17 DIAGNOSIS — J301 Allergic rhinitis due to pollen: Secondary | ICD-10-CM | POA: Diagnosis not present

## 2014-12-18 DIAGNOSIS — M7672 Peroneal tendinitis, left leg: Secondary | ICD-10-CM | POA: Diagnosis not present

## 2014-12-20 DIAGNOSIS — M7672 Peroneal tendinitis, left leg: Secondary | ICD-10-CM | POA: Diagnosis not present

## 2014-12-24 DIAGNOSIS — M7672 Peroneal tendinitis, left leg: Secondary | ICD-10-CM | POA: Diagnosis not present

## 2014-12-25 DIAGNOSIS — J301 Allergic rhinitis due to pollen: Secondary | ICD-10-CM | POA: Diagnosis not present

## 2014-12-25 DIAGNOSIS — J3081 Allergic rhinitis due to animal (cat) (dog) hair and dander: Secondary | ICD-10-CM | POA: Diagnosis not present

## 2014-12-25 DIAGNOSIS — J3089 Other allergic rhinitis: Secondary | ICD-10-CM | POA: Diagnosis not present

## 2014-12-31 ENCOUNTER — Telehealth: Payer: Self-pay | Admitting: Gastroenterology

## 2014-12-31 NOTE — Telephone Encounter (Signed)
We know what the problem is...there was a tender anal fissure on exam.  These can take 6 weeks or more to heal so he should continue the medication for now.  Needs to keep stools soft.  Please have him start Miralax daily if not already taking.  If he is already taking then increase to BID for now.  Please get him a follow-up with Dr. Hilarie Fredrickson or myself in 3-4 weeks if there is not one scheduled already.  Thank you,  Jess

## 2014-12-31 NOTE — Telephone Encounter (Signed)
Spoke with patient and he had been doing better while using medication. Then on Friday, had a few spots of blood on tissue and this continued on Saturday. Yesterday, the bleeding increased and was in toilet and on tissue with bowel movement. He was a little constipated yesterday. He is continuing the medications as ordered. Today, no pain just discomfort, small amount of bleeding. He is asking if he should have OV or just continue with medications for a few more weeks. Please, advise.

## 2015-01-01 NOTE — Telephone Encounter (Signed)
Patient notified of results and recommendations. He will call back next week to schedule OV with Alonza Bogus, PA.

## 2015-01-01 NOTE — Telephone Encounter (Signed)
Left a message for patient to call back. 

## 2015-01-07 DIAGNOSIS — J3081 Allergic rhinitis due to animal (cat) (dog) hair and dander: Secondary | ICD-10-CM | POA: Diagnosis not present

## 2015-01-07 DIAGNOSIS — J301 Allergic rhinitis due to pollen: Secondary | ICD-10-CM | POA: Diagnosis not present

## 2015-01-07 DIAGNOSIS — J3089 Other allergic rhinitis: Secondary | ICD-10-CM | POA: Diagnosis not present

## 2015-01-08 ENCOUNTER — Ambulatory Visit: Payer: PRIVATE HEALTH INSURANCE | Admitting: Family Medicine

## 2015-01-10 DIAGNOSIS — J4530 Mild persistent asthma, uncomplicated: Secondary | ICD-10-CM | POA: Diagnosis not present

## 2015-01-10 DIAGNOSIS — J3089 Other allergic rhinitis: Secondary | ICD-10-CM | POA: Diagnosis not present

## 2015-01-10 DIAGNOSIS — J301 Allergic rhinitis due to pollen: Secondary | ICD-10-CM | POA: Diagnosis not present

## 2015-01-10 DIAGNOSIS — J3081 Allergic rhinitis due to animal (cat) (dog) hair and dander: Secondary | ICD-10-CM | POA: Diagnosis not present

## 2015-01-11 DIAGNOSIS — L821 Other seborrheic keratosis: Secondary | ICD-10-CM | POA: Diagnosis not present

## 2015-01-11 DIAGNOSIS — D225 Melanocytic nevi of trunk: Secondary | ICD-10-CM | POA: Diagnosis not present

## 2015-01-11 DIAGNOSIS — D1800 Hemangioma unspecified site: Secondary | ICD-10-CM | POA: Diagnosis not present

## 2015-01-11 DIAGNOSIS — L814 Other melanin hyperpigmentation: Secondary | ICD-10-CM | POA: Diagnosis not present

## 2015-01-11 DIAGNOSIS — D1779 Benign lipomatous neoplasm of other sites: Secondary | ICD-10-CM | POA: Diagnosis not present

## 2015-01-14 DIAGNOSIS — J3089 Other allergic rhinitis: Secondary | ICD-10-CM | POA: Diagnosis not present

## 2015-01-14 DIAGNOSIS — J3081 Allergic rhinitis due to animal (cat) (dog) hair and dander: Secondary | ICD-10-CM | POA: Diagnosis not present

## 2015-01-14 DIAGNOSIS — J301 Allergic rhinitis due to pollen: Secondary | ICD-10-CM | POA: Diagnosis not present

## 2015-01-18 DIAGNOSIS — J301 Allergic rhinitis due to pollen: Secondary | ICD-10-CM | POA: Diagnosis not present

## 2015-01-18 DIAGNOSIS — J3089 Other allergic rhinitis: Secondary | ICD-10-CM | POA: Diagnosis not present

## 2015-01-21 DIAGNOSIS — J3089 Other allergic rhinitis: Secondary | ICD-10-CM | POA: Diagnosis not present

## 2015-01-21 DIAGNOSIS — J3081 Allergic rhinitis due to animal (cat) (dog) hair and dander: Secondary | ICD-10-CM | POA: Diagnosis not present

## 2015-01-21 DIAGNOSIS — J301 Allergic rhinitis due to pollen: Secondary | ICD-10-CM | POA: Diagnosis not present

## 2015-01-29 ENCOUNTER — Encounter: Payer: Self-pay | Admitting: Family Medicine

## 2015-01-29 ENCOUNTER — Ambulatory Visit (INDEPENDENT_AMBULATORY_CARE_PROVIDER_SITE_OTHER): Payer: Medicare Other | Admitting: Neurology

## 2015-01-29 ENCOUNTER — Ambulatory Visit (INDEPENDENT_AMBULATORY_CARE_PROVIDER_SITE_OTHER): Payer: Medicare Other | Admitting: Family Medicine

## 2015-01-29 ENCOUNTER — Encounter: Payer: Self-pay | Admitting: Neurology

## 2015-01-29 VITALS — BP 130/82 | HR 80 | Ht 67.0 in | Wt 206.0 lb

## 2015-01-29 VITALS — BP 114/70 | HR 79 | Temp 98.6°F | Wt 206.0 lb

## 2015-01-29 DIAGNOSIS — G609 Hereditary and idiopathic neuropathy, unspecified: Secondary | ICD-10-CM | POA: Diagnosis not present

## 2015-01-29 DIAGNOSIS — E785 Hyperlipidemia, unspecified: Secondary | ICD-10-CM

## 2015-01-29 DIAGNOSIS — J4520 Mild intermittent asthma, uncomplicated: Secondary | ICD-10-CM

## 2015-01-29 DIAGNOSIS — I10 Essential (primary) hypertension: Secondary | ICD-10-CM | POA: Diagnosis not present

## 2015-01-29 MED ORDER — GABAPENTIN 100 MG PO CAPS: ORAL_CAPSULE | ORAL | Status: DC

## 2015-01-29 MED ORDER — NORTRIPTYLINE HCL 10 MG PO CAPS: ORAL_CAPSULE | ORAL | Status: DC

## 2015-01-29 NOTE — Assessment & Plan Note (Signed)
S: controlled. On olmesartan-hctz 40-25  BP Readings from Last 3 Encounters:  01/29/15 130/82  01/29/15 114/70  12/06/14 120/70  A/P:Continue current meds:  No change

## 2015-01-29 NOTE — Assessment & Plan Note (Signed)
S: controlled on Qvar only 15mcg 2 puffs twice daily. Doesn't have to use albuterol except if he has a cold at times which is rare A/P: continue current rx as controlled

## 2015-01-29 NOTE — Patient Instructions (Signed)
Follow up in March  Blood pressure looks great on repeat  Cholesterol- would prefer to go below 70 with bad cholesterol given your heart history but you declined due to concern of side effects and # of medications- agreed if ever any blockage that we would be forced to go up on this

## 2015-01-29 NOTE — Progress Notes (Signed)
NEUROLOGY FOLLOW UP OFFICE NOTE  Joshua [name] [id number]  HISTORY OF PRESENT ILLNESS: I had the pleasure of seeing Joshua [name] in follow-up in the neurology clinic on 01/29/2015.  The patient was last seen on 4 months ago for dizziness and neuropathy. The dizziness had resolved with Lexapro discontinuation. He had one bout of vertigo over the summer, with good response to vestibular therapy. He has a history of burning pain in both feet, as well as in the penile region since prostatectomy. Since his last visit, he had called to see if he could increase dose due to worsening symptoms, he temporarily increased the dose and had to take Tramadol, but after he started feeling better, self-reduced to 23m BID with good effect. He denies any falls. He reports having a bad summer, he had diverticulitis, vertigo, hamstring injury, then most recently tendinitis in his left foot. He did notice that with lower dose of gabapentin, his emotional state worsened and he feels depression is coming back. He denies any headaches, diplopia, focal weakness, bowel/bladder dysfunction.  HPI: This is a very pleasant 74yo RH man with a history of hypertension, hyperlipidemia, B12 deficiency, and diagnosis of neuropathy, who initially presented with dizziness that started in June 2014. He described the dizziness as gait unsteadiness where he walks like he is drunk ("the wobbles"). He denies any true vertigo or lightheadedness. He feels like he shuffles, and was concerned after he fell off his truck last May due to unsteadiness. He felt his thinking was foggy and out of focus, with difficulty concentrating. He was having these symptoms almost daily, especially when going to the bathroom. He does not feel dizzy when sitting or lying down. He asked for Lexapro to be discontinued because he felt drugged with "terrible anger dreams," and has been off the medication for 2 weeks with no further dizziness.   He has been  diagnosed with neuropathy due to burning pain and pins and needles sensation in both feet that has been ongoing for the past 4-5 years. He started gabapentin in 2014, with some effect on 3037mday but could not function on 60023may. He has had a prostatectomy and has pain in the penile region. He has occasional bladder incontinence, neck and back pain. He reports frostbite in both [age] at age [age] Laboratory Data: TSH, B12, ESR, SPEP/IFE normal.  PAST MEDICAL HISTORY: Past Medical History  Diagnosis Date  . Asthma   . CAD (coronary artery disease)     CABG 2001  . Depression   . GERD (gastroesophageal reflux disease)   . Hyperlipidemia   . IBS (irritable bowel syndrome)   . Acute medial meniscal tear   . Hypertension   . Anemia   . Diverticulosis   . Obesity   . Pancreatitis ~ 1980  . Acute sinusitis 12/14/2006    Qualifier: Diagnosis of  By: JenArnoldo Morale, JohBalinda Quails. [facility]  . Bladder cancer ([facility]/2012    "cauterized during prostate OR"  . Pneumonia 1980's X 2  . OSA on CPAP   . History of blood transfusion 2001    "related to the bypass OR"  . DJD (degenerative joint disease)   . Arthritis     "middle finger right hand; right knee; neck" (02/06/2014)  . History of gout     MEDICATIONS: Current Outpatient Prescriptions on File Prior to Visit  Medication Sig Dispense Refill  . acetaminophen (TYLENOL) 500 MG tablet Take 1,000 mg  by mouth every 6 (six) hours as needed for headache (pain).    [name] aspirin EC 81 MG tablet Take 81 mg by mouth daily.    [name] BENICAR HCT 40-25 MG per tablet TAKE ONE TABLET BY MOUTH ONE TIME DAILY 30 tablet 5  . Cyanocobalamin (VITAMIN B 12 PO) Take 800 mg by mouth daily.    [name] desloratadine (CLARINEX) 5 MG tablet Take 5 mg by mouth at bedtime.     . diazepam (VALIUM) 5 MG tablet Take 1 tablet (5 mg total) by mouth at bedtime as needed for anxiety (sleep). 30 tablet 2  . FLOVENT HFA 110 MCG/ACT inhaler Inhale 2 puffs into the  lungs 2 (two) times daily.  5  . fluticasone (FLONASE) 50 MCG/ACT nasal spray Place 1 spray into both nostrils 2 (two) times daily.     . folic acid (FOLVITE) 740 MCG tablet Take 400 mcg by mouth daily.    [name] gabapentin (NEURONTIN) 100 MG capsule Take 2 caps in AM, 1 cap in evening, 2 caps at bedtime (Patient taking differently: Takes 2 capsules in the morning and 2 capsules in the evening) 150 capsule 11  . LAC-HYDRIN FIVE 5 % LOTN lotion APPLY TO AFFECTED AREA(S) TWICE DAILY 120 mL 2  . omeprazole (PRILOSEC) 20 MG capsule TAKE ONE CAPSULE BY MOUTH DAILY AS NEEDED FOR ACID REFLUX/HEARTBURN 90 capsule 2  . ondansetron (ZOFRAN) 8 MG tablet TAKE ONE TABLET BY MOUTH EVERY EIGHT HOURS AS NEEDED FOR NAUSEA  3  . polyethylene glycol powder (GLYCOLAX/MIRALAX) powder Take 17 g by mouth daily. 255 g 0  . Probiotic Product (ALIGN) 4 MG CAPS Take 4 mg by mouth daily.     . simvastatin (ZOCOR) 20 MG tablet TAKE ONE TABLET BY MOUTH AT BEDTIME 30 tablet 5  . traMADol (ULTRAM) 50 MG tablet 50 mg. 1 to 2 tablets twice daily as needed for pain  1  . albuterol (PROAIR HFA) 108 (90 BASE) MCG/ACT inhaler Inhale 1-2 puffs into the lungs every 6 (six) hours as needed for wheezing or shortness of breath. (Patient not taking: Reported on 01/29/2015) 18 g 3  . valsartan-hydrochlorothiazide (DIOVAN-HCT) 320-25 MG per tablet Take 1 tablet by mouth daily. 30 tablet 5   No current facility-administered medications on file prior to visit.    ALLERGIES: Allergies  Allergen Reactions  . Azithromycin Other (See Comments)    Dizziness and double vision   . 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 Bromfed Nausea And Vomiting  . Clarithromycin Other (See Comments)     gastritis  . Codeine Other (See Comments)    Trouble breathing  . Doxycycline Hyclate Nausea And Vomiting  . Modafinil Other (See Comments)     anxiety-nervousness  . Promethazine Hcl Other (See  Comments)     fainting  . Quinolones     Cipro  . Telithromycin Nausea And Vomiting  . Erythromycin Base Rash  . Hydrocodone-Acetaminophen Rash  . Sulfonamide Derivatives Rash    FAMILY HISTORY: Family History  Problem Relation Age of Onset  . Heart disease Father   . Heart disease Mother     SOCIAL HISTORY: Social History   Social History  . Marital Status: Married    Spouse Name: N/A  . Number of Children: N/A  . Years of Education: N/A   Occupational History  . retired    Social History Main Topics  . Smoking status: Former  Smoker -- 1.00 packs/day for 35 years    Types: Cigarettes    Quit date: 06/16/1992  . Smokeless tobacco: Never Used  . Alcohol Use: 2.4 oz/week    4 Shots of liquor per week     Comment: mixed drink  . Drug Use: No  . Sexual Activity: No   Other Topics Concern  . Not on file   Social History Narrative   Married 42 years in 2015. No kids (mumps at age 21)   Retired from [employer] in [employer] 2x a week   Hobbies: Chief Strategy Officer, movies and tv, staying active    REVIEW OF SYSTEMS: Constitutional: No fevers, chills, or sweats, no generalized fatigue, change in appetite Eyes: No visual changes, double vision, eye pain Ear, nose and throat: No hearing loss, ear pain, nasal congestion, sore throat Cardiovascular: No chest pain, palpitations Respiratory:  No shortness of breath at rest or with exertion, wheezes GastrointestinaI: No nausea, vomiting, diarrhea, abdominal pain, fecal incontinence Genitourinary:  No dysuria, urinary retention or frequency Musculoskeletal:  + neck pain, back pain Integumentary: No rash, pruritus, skin lesions Neurological: as above Psychiatric: No depression, insomnia, anxiety Endocrine: No palpitations, fatigue, diaphoresis, mood swings, change in appetite, change in weight, increased thirst Hematologic/Lymphatic:  No anemia, purpura, petechiae. Allergic/Immunologic: no itchy/runny eyes, nasal  congestion, recent allergic reactions, rashes  PHYSICAL EXAM: Filed Vitals:   01/29/15 1453  BP: 130/82  Pulse: 80   General: No acute distress Head:  Normocephalic/atraumatic Neck: supple, no paraspinal tenderness, full range of motion Heart:  Regular rate and rhythm Lungs:  Clear to auscultation bilaterally Back: No paraspinal tenderness Skin/Extremities: No rash, no edema Neurological Exam: alert and oriented to person, place, and time. No aphasia or dysarthria. Fund of knowledge is appropriate.  Recent and remote memory are intact.  Attention and concentration are normal.  Able to name objects and repeat phrases. Cranial nerves: Pupils equal, round, reactive to light.  Fundoscopic exam unremarkable, no papilledema. Extraocular movements intact with no nystagmus. Visual fields full. Facial sensation intact. No facial asymmetry. Tongue, uvula, palate midline.  Motor: Bulk and tone normal, muscle strength 5/5 throughout with no pronator drift.  Sensation: hyperesthesia to pin on both feet, decreased vibration to ankles bilaterally (similar to prior). No extinction to double simultaneous stimulation.  Deep tendon reflexes 2+ throughout, toes downgoing.  Finger to nose testing intact.  Gait narrow-based and steady, able to tandem walk adequately.  Romberg negative.  IMPRESSION: This is a very pleasant 74 yo RH man with a history of hypertension, hyperlipidemia, who initially presented with dizziness that has resolved since Lexapro stopped. No further dizzy episodes. He has neuropathic pain in both feet and in the penile region (since prostatectomy), and has had good response to gabapentin, but has noticed a change in his emotional state with reduction in dose. He is asking about other medication options, we discussed nortriptyline for neuropathy, which is also an anti-depressant. He recalls taking this in the past for back pain and cannot recall any side effects. He would like to try this instead,  and was given an uptitration schedule, side effects were discussed. He will start tapering gabapentin as he starts nortriptyline. He will reconnect with his psychotherapist for the depression and will follow-up in 4 months. He knows to call our office fro any changes.   Thank you for allowing me to participate in his care.  Please do not hesitate to call for any questions or concerns.  The duration  of this appointment visit was 25 minutes of face-to-face time with the patient.  Greater than 50% of this time was spent in counseling, explanation of diagnosis, planning of further management, and coordination of care.   Ellouise Newer, M.D.   CC: Dr. Yong Channel

## 2015-01-29 NOTE — Patient Instructions (Signed)
1. After your vacation, start nortriptyline 10mg : take 1 capsule at night for 1 week, then increase to 2 capsules at night. Monitor for drowsiness 2. Once you start nortriptyline, start tapering down the gabapentin by 1 capsule every week 3. Reconnect with psychotherapist for mood 4. Follow-up in 3 months, call for any problems

## 2015-01-29 NOTE — Assessment & Plan Note (Signed)
S: mild poor control as true goal should be LDL <70 with coronary atherosclerosis Lab Results  Component Value Date   CHOL 168 11/19/2014   HDL 40.70 11/19/2014   LDLCALC 95 11/19/2014   LDLDIRECT 81.1 07/04/2009   TRIG 158.0* 11/19/2014   CHOLHDL 4 11/19/2014  A/P:LDL <100, patient declines LDL goal <70. Patient is concerned about higher side effects at higher dose and just doesn't want to take more medicine despite my advisement of benefits/risks

## 2015-01-29 NOTE — Progress Notes (Signed)
[name], MD  Subjective:  Joshua [name] is a 74 y.o. year old very pleasant male patient who presents for/with See problem oriented charting ROS- No chest pain or shortness of breath. No headache or blurry vision.   Past Medical History-  Patient Active Problem List   Diagnosis Date Noted  . Coronary atherosclerosis 10/12/2006    Priority: High  . Irritable bowel syndrome 10/12/2006    Priority: High  . CKD (chronic kidney disease), stage III 10/23/2013    Priority: Medium  . Neuropathy ([place]) 08/21/2013    Priority: Medium  . 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 Essential hypertension 02/21/2007    Priority: Medium  . Hyperlipidemia 10/12/2006    Priority: Medium  . DEPRESSION 10/12/2006    Priority: Medium  . Asthma 10/12/2006    Priority: Medium  . Allergic rhinitis 05/31/2014    Priority: Low  . Fatigue 11/09/2013    Priority: Low  . Dizziness 08/23/2013    Priority: Low  . RLS (restless legs syndrome) 01/03/2013    Priority: Low  . Low back pain radiating to left leg 10/09/2011    Priority: Low  . OSTEOARTHRITIS, LOWER LEG, LEFT 10/04/2009    Priority: Low  . TOBACCO ABUSE, HX OF 12/26/2008    Priority: Low  . ANEMIA, IRON DEFICIENCY 11/06/2008    Priority: Low  . 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 OSA (obstructive sleep apnea) 11/02/2006    Priority: Low  . GERD 10/12/2006    Priority: Low  . Hereditary and idiopathic peripheral neuropathy 09/25/2014    Medications- reviewed and updated Current Outpatient Prescriptions  Medication Sig Dispense Refill  . aspirin EC 81 MG tablet Take 81 mg by mouth daily.    . Cyanocobalamin (VITAMIN B 12 PO) Take 800 mg by mouth daily.    [name] desloratadine (CLARINEX) 5 MG tablet Take 5 mg by mouth at bedtime.     [name] FLOVENT HFA 110 MCG/ACT inhaler Inhale 2 puffs into the lungs 2 (two) times daily.  5  . fluticasone (FLONASE) 50 MCG/ACT nasal spray  Place 1 spray into both nostrils 2 (two) times daily.     . folic acid (FOLVITE) 179 MCG tablet Take 400 mcg by mouth daily.    [name] LAC-HYDRIN FIVE 5 % LOTN lotion APPLY TO AFFECTED AREA(S) TWICE DAILY 120 mL 2  . omeprazole (PRILOSEC) 20 MG capsule TAKE ONE CAPSULE BY MOUTH DAILY AS NEEDED FOR ACID REFLUX/HEARTBURN 90 capsule 2  . polyethylene glycol powder (GLYCOLAX/MIRALAX) powder Take 17 g by mouth daily. 255 g 0  . Probiotic Product (ALIGN) 4 MG CAPS Take 4 mg by mouth daily.     . simvastatin (ZOCOR) 20 MG tablet TAKE ONE TABLET BY MOUTH AT BEDTIME 30 tablet 5  . valsartan-hydrochlorothiazide (DIOVAN-HCT) 320-25 MG per tablet Take 1 tablet by mouth daily. 30 tablet 5  . acetaminophen (TYLENOL) 500 MG tablet Take 1,000 mg by mouth every 6 (six) hours as needed for headache (pain).    [name] albuterol (PROAIR HFA) 108 (90 BASE) MCG/ACT inhaler Inhale 1-2 puffs into the lungs every 6 (six) hours as needed for wheezing or shortness of breath. (Patient not taking: Reported on 01/29/2015) 18 g 3  . diazepam (VALIUM) 5 MG tablet Take 1 tablet (5 mg total) by mouth at bedtime as needed for anxiety (sleep). 30 tablet 2  . gabapentin (NEURONTIN) 100 MG capsule Take 2 caps in AM,  1 cap in evening, 2 caps at bedtime 150 capsule 11  . nortriptyline (PAMELOR) 10 MG capsule Take 1 capsule at night for 1 week, then increase to 2 capsules at night 60 capsule 4  . ondansetron (ZOFRAN) 8 MG tablet TAKE ONE TABLET BY MOUTH EVERY EIGHT HOURS AS NEEDED FOR NAUSEA  3  . traMADol (ULTRAM) 50 MG tablet 50 mg. 1 to 2 tablets twice daily as needed for pain  1   No current facility-administered medications for this visit.    Objective: BP 114/70 mmHg  Pulse 79  Temp(Src) 98.6 F (37 C)  Wt 206 lb (93.441 kg) Gen: NAD, resting comfortably CV: RRR no murmurs Lungs: CTAB no crackles, wheeze, rhonchi. nonlabored Abdomen: soft/nontender/nondistended/normal bowel sounds.  Ext: no edema Skin: warm, dry, no rash Neuro:  grossly normal, moves all extremities, normal gait  Assessment/Plan:  Essential hypertension S: controlled. On olmesartan-hctz 40-25  BP Readings from Last 3 Encounters:  01/29/15 130/82  01/29/15 114/70  12/06/14 120/70  A/P:Continue current meds:  No change   Hyperlipidemia S: mild poor control as true goal should be LDL <70 with coronary atherosclerosis Lab Results  Component Value Date   CHOL 168 11/19/2014   HDL 40.70 11/19/2014   LDLCALC 95 11/19/2014   LDLDIRECT 81.1 07/04/2009   TRIG 158.0* 11/19/2014   CHOLHDL 4 11/19/2014  A/P:LDL <100, patient declines LDL goal <70. Patient is concerned about higher side effects at higher dose and just doesn't want to take more medicine despite my advisement of benefits/risks   Asthma S: controlled on Qvar only 43mcg 2 puffs twice daily. Doesn't have to use albuterol except if he has a cold at times which is rare A/P: continue current rx as controlled  4 month follow up per pt request

## 2015-02-04 DIAGNOSIS — J3089 Other allergic rhinitis: Secondary | ICD-10-CM | POA: Diagnosis not present

## 2015-02-04 DIAGNOSIS — J301 Allergic rhinitis due to pollen: Secondary | ICD-10-CM | POA: Diagnosis not present

## 2015-02-04 DIAGNOSIS — J3081 Allergic rhinitis due to animal (cat) (dog) hair and dander: Secondary | ICD-10-CM | POA: Diagnosis not present

## 2015-02-06 ENCOUNTER — Telehealth: Payer: Self-pay | Admitting: Gastroenterology

## 2015-02-06 ENCOUNTER — Other Ambulatory Visit: Payer: Self-pay | Admitting: Family Medicine

## 2015-02-06 NOTE — Telephone Encounter (Signed)
Go ahead and send a prescription for Augmentin 875 mg po BID x 10 days to have on hand while on vacation, but should only start if absolutely necessary.

## 2015-02-06 NOTE — Telephone Encounter (Signed)
Spoke with patient's wife June. He has had bloating and abdominal pain similar to what he had with diverticulitis for several days. Up last night with loose stools and gas. He has tried [employer] for gas. He will be leaving for vacation tomorrow. Please, advise.(See mulitple allergies)

## 2015-02-07 NOTE — Telephone Encounter (Signed)
Left a message for patient to call back. 

## 2015-02-08 NOTE — Telephone Encounter (Signed)
Left a message for patient to call back. 

## 2015-02-11 NOTE — Telephone Encounter (Signed)
Left a message to call back.

## 2015-02-12 NOTE — Telephone Encounter (Signed)
Patient did not return our calls.

## 2015-02-19 DIAGNOSIS — J301 Allergic rhinitis due to pollen: Secondary | ICD-10-CM | POA: Diagnosis not present

## 2015-02-19 DIAGNOSIS — J3089 Other allergic rhinitis: Secondary | ICD-10-CM | POA: Diagnosis not present

## 2015-02-19 DIAGNOSIS — J3081 Allergic rhinitis due to animal (cat) (dog) hair and dander: Secondary | ICD-10-CM | POA: Diagnosis not present

## 2015-02-20 ENCOUNTER — Telehealth: Payer: Self-pay | Admitting: Neurology

## 2015-02-20 NOTE — Telephone Encounter (Signed)
Returned call. Patient wanted to get clarification of his instructions from last office visit for starting the nortriptyline and decreasing the gabapentin. I did go over his instructions for his medications per Dr. Amparo Bristol 01/29/15 note. Patient verbalized good understanding.

## 2015-02-20 NOTE — Telephone Encounter (Signed)
PT called in regards to his medication gabapentin/Dawn CB# [phone]

## 2015-02-28 ENCOUNTER — Telehealth: Payer: Self-pay | Admitting: Neurology

## 2015-02-28 NOTE — Telephone Encounter (Signed)
Pt/ called to inform that he has stopped taking med/Nortriptyline due to causing sleepiness on his job/ and currently only taking the Gabapentin/wants to know if there are any other options/[phone]

## 2015-02-28 NOTE — Telephone Encounter (Signed)
Left msg to rtn my call.

## 2015-02-28 NOTE — Telephone Encounter (Signed)
Unfortunately majority of medications would potentially cause drowsiness. He can try the lidocaine patches for his feet, if he would like (and if insurance covers).

## 2015-02-28 NOTE — Telephone Encounter (Signed)
Please review

## 2015-03-01 MED ORDER — DULOXETINE HCL 20 MG PO CPEP: 20.0000 mg | ORAL_CAPSULE | Freq: Every day | ORAL | Status: DC

## 2015-03-01 NOTE — Telephone Encounter (Signed)
Patient returned my call.  I told he what you said. He states he doesn't want to try the patch because he tried a pain patch in the past and it made him sick. He states he would like to try a different pill to see if it will help him & not have the same effect as the nortriptyline.

## 2015-03-01 NOTE — Telephone Encounter (Signed)
We can start very low dose Cymbalta 20mg  qhs, pls review again side effects of drowsiness and monitor how he feels before he drives. Thanks

## 2015-03-01 NOTE — Telephone Encounter (Signed)
I spoke patient about trying Cymbalta, patient is agreeable. I did advise him to look out for the possible side effect of drowsiness, and monitor how he feels after starting the medication before driving.

## 2015-03-04 DIAGNOSIS — J3081 Allergic rhinitis due to animal (cat) (dog) hair and dander: Secondary | ICD-10-CM | POA: Diagnosis not present

## 2015-03-04 DIAGNOSIS — J301 Allergic rhinitis due to pollen: Secondary | ICD-10-CM | POA: Diagnosis not present

## 2015-03-04 DIAGNOSIS — J3089 Other allergic rhinitis: Secondary | ICD-10-CM | POA: Diagnosis not present

## 2015-03-11 DIAGNOSIS — J3089 Other allergic rhinitis: Secondary | ICD-10-CM | POA: Diagnosis not present

## 2015-03-11 DIAGNOSIS — J301 Allergic rhinitis due to pollen: Secondary | ICD-10-CM | POA: Diagnosis not present

## 2015-03-11 DIAGNOSIS — J3081 Allergic rhinitis due to animal (cat) (dog) hair and dander: Secondary | ICD-10-CM | POA: Diagnosis not present

## 2015-03-13 ENCOUNTER — Telehealth: Payer: Self-pay | Admitting: *Deleted

## 2015-03-13 NOTE — Telephone Encounter (Signed)
Patient called stating that he has been started on duloxetine and it is making him feel tired and weak.  He was on nortriptyline which made him feel like a robot.  Should he stop the medication?  Please advise.  [phone]

## 2015-03-13 NOTE — Telephone Encounter (Signed)
Called patient and notified him to d/c the Cymbalta and continue with Gabapentin.

## 2015-03-13 NOTE — Telephone Encounter (Signed)
States that since starting the Cymbalta he has felt tired & weak. States that his wife states he seems to have become disoriented since starting medication. States he was having some stomach pains but that went away.

## 2015-03-13 NOTE — Telephone Encounter (Signed)
Would stop medication and continue on gabapentin for now. Thanks

## 2015-03-19 DIAGNOSIS — J3089 Other allergic rhinitis: Secondary | ICD-10-CM | POA: Diagnosis not present

## 2015-03-19 DIAGNOSIS — J3081 Allergic rhinitis due to animal (cat) (dog) hair and dander: Secondary | ICD-10-CM | POA: Diagnosis not present

## 2015-03-19 DIAGNOSIS — J301 Allergic rhinitis due to pollen: Secondary | ICD-10-CM | POA: Diagnosis not present

## 2015-03-22 ENCOUNTER — Ambulatory Visit (INDEPENDENT_AMBULATORY_CARE_PROVIDER_SITE_OTHER): Payer: Medicare Other

## 2015-03-22 ENCOUNTER — Ambulatory Visit (INDEPENDENT_AMBULATORY_CARE_PROVIDER_SITE_OTHER): Payer: Medicare Other | Admitting: Physician Assistant

## 2015-03-22 VITALS — BP 102/66 | HR 103 | Temp 99.2°F | Resp 18 | Ht 66.75 in | Wt 200.8 lb

## 2015-03-22 DIAGNOSIS — I499 Cardiac arrhythmia, unspecified: Secondary | ICD-10-CM

## 2015-03-22 DIAGNOSIS — R69 Illness, unspecified: Secondary | ICD-10-CM

## 2015-03-22 DIAGNOSIS — J101 Influenza due to other identified influenza virus with other respiratory manifestations: Secondary | ICD-10-CM

## 2015-03-22 DIAGNOSIS — J111 Influenza due to unidentified influenza virus with other respiratory manifestations: Secondary | ICD-10-CM | POA: Diagnosis not present

## 2015-03-22 LAB — POCT CBC
Granulocyte percent: 78.3 %G (ref 37–80)
HCT, POC: 44.9 % (ref 43.5–53.7)
Hemoglobin: 15.6 g/dL (ref 14.1–18.1)
Lymph, poc: 1 (ref 0.6–3.4)
MCH, POC: 30.5 pg (ref 27–31.2)
MCHC: 34.9 g/dL (ref 31.8–35.4)
MCV: 87.6 fL (ref 80–97)
MID (cbc): 0.9 (ref 0–0.9)
MPV: 5.9 fL (ref 0–99.8)
POC Granulocyte: 6.9 (ref 2–6.9)
POC LYMPH PERCENT: 11.5 %L (ref 10–50)
POC MID %: 10.2 %M (ref 0–12)
Platelet Count, POC: 211 10*3/uL (ref 142–424)
RBC: 5.12 M/uL (ref 4.69–6.13)
RDW, POC: 12.6 %
WBC: 8.8 10*3/uL (ref 4.6–10.2)

## 2015-03-22 LAB — POCT INFLUENZA A/B
Influenza A, POC: POSITIVE — AB
Influenza B, POC: NEGATIVE

## 2015-03-22 MED ORDER — OSELTAMIVIR PHOSPHATE 75 MG PO CAPS: 75.0000 mg | ORAL_CAPSULE | Freq: Two times a day (BID) | ORAL | Status: DC

## 2015-03-22 NOTE — Progress Notes (Signed)
Discussed patient with [name], PA-C. The patient has influenza. He has had some chest pain for the last 24 hours like somebody had hit him in the chest. He says it's not like his heart pains. I talked with the patient. He has had no radicular pain, no diaphoresis, no shortness of breath like he is had with his heart and the past. He has not been aware of palpitations. He is not taking any decongestant type medication that might increase irritability of the heart. He has frequent unifocal PVCs in a trigeminal fashion on his EKG. When I listen to him they were less frequent, every fourth or fifth beat.  [facility] reading (PRIMARY) by  Dr. Linna Darner Normal chest, compared with prior film from August.  I believe these are innocent PVCs, probably made more frequent by being sick. I think we should discuss with his cardiologist before deciding if it is necessary to send him anywhere.    Spoke with his cardiologist who was unable to pull up the EKG. I fax a copy to him. However he did not feel like the PVCs alone were a cause for fear of ischemia, and I am comfortable that his symptoms as he describes them or not heart related better from the flu.  Posey Boyer M.D.

## 2015-03-22 NOTE — Progress Notes (Signed)
03/22/2015 12:04 PM   DOB: 20-Mar-1941 / MRN: [id number]  SUBJECTIVE:  Joshua [name] is a 74 y.o. male presenting for cough, headache and states that he feels that his head is in a vice. These symptoms that started 2 days ago. He associates fever and pain with coughing.  He has a history of CAD S/P CABG x 5 in 2001 and he sees a cardiologist yearly with last check in October.  The cardiologists at that time told him he was doing well.    He had the flu shot in September.   He is allergic to azithromycin; benadryl; bromfed; clarithromycin; codeine; doxycycline hyclate; modafinil; promethazine hcl; quinolones; telithromycin; erythromycin base; hydrocodone-acetaminophen; and sulfonamide derivatives.   He  has a past medical history of Asthma; CAD (coronary artery disease); Depression; GERD (gastroesophageal reflux disease); Hyperlipidemia; IBS (irritable bowel syndrome); Acute medial meniscal tear; Hypertension; Anemia; Diverticulosis; Obesity; Pancreatitis (~ 1980); Acute sinusitis (12/14/2006); Prostate cancer ([place]) (04/2010); Bladder cancer ([place]) (04/2010); Pneumonia (1980's X 2); OSA on CPAP; History of blood transfusion (2001); DJD (degenerative joint disease); Arthritis; and History of gout.    He  reports that he quit smoking about 22 years ago. His smoking use included Cigarettes. He has a 35 pack-year smoking history. He has never used smokeless tobacco. He reports that he drinks about 2.4 oz of alcohol per week. He reports that he does not use illicit drugs. He  reports that he does not engage in sexual activity. The patient  has past surgical history that includes Thyroidectomy, partial (1980); Anterior cervical decomp/discectomy fusion (05/26/06); Knee arthroscopy (Right, 1992; 04/2008); Robot assisted laparoscopic radical prostatectomy (04/2010); Nasal septum surgery (Right, 2007); Back surgery; Laparoscopic cholecystectomy (2007); Cardiac catheterization (11/20/1999); and Coronary artery  bypass graft (11/20/1999).  His family history includes Diabetes in his brother; Heart disease in his father and mother; Hyperlipidemia in his father and mother.  Review of Systems  Constitutional: Negative for fever and chills.  Eyes: Negative for blurred vision.  Respiratory: Positive for cough. Negative for shortness of breath.   Cardiovascular: Positive for chest pain. Negative for orthopnea and leg swelling.  Gastrointestinal: Positive for abdominal pain. Negative for nausea.  Genitourinary: Negative for dysuria, urgency and frequency.  Musculoskeletal: Negative for myalgias.  Skin: Negative for rash.  Neurological: Negative for dizziness, tingling and headaches.  Psychiatric/Behavioral: Negative for depression. The patient is not nervous/anxious.     Problem list and medications reviewed and updated by myself where necessary, and exist elsewhere in the encounter.   OBJECTIVE:  BP 102/66 mmHg  Pulse 103  Temp(Src) 99.2 F (37.3 C) (Oral)  Resp 18  Ht 5' 6.75" (1.695 m)  Wt 200 lb 12.8 oz (91.082 kg)  BMI 31.70 kg/m2  SpO2 94%  Physical Exam  Constitutional: He is oriented to person, place, and time. He appears well-developed. No distress.  Neck: No JVD present.  Cardiovascular: S1 normal, S2 normal and normal heart sounds.  Frequent extrasystoles are present. Exam reveals no gallop, no S3, no S4, no distant heart sounds and no friction rub.   No murmur heard. Pulmonary/Chest: No stridor. No respiratory distress. He has no wheezes. He has rales (bilateral in lower lung fields). He exhibits no tenderness.  Abdominal: Soft. Bowel sounds are normal.  Musculoskeletal: Normal range of motion.  Lymphadenopathy:    He has no cervical adenopathy.  Neurological: He is alert and oriented to person, place, and time.  Skin: Skin is warm and dry. He is not diaphoretic.  Psychiatric:  He has a normal mood and affect.  Vitals reviewed.   Results for orders placed or performed in visit  on 03/22/15 (from the past 48 hour(s))  POCT Influenza A/B     Status: Abnormal   Collection Time: 03/22/15 11:50 AM  Result Value Ref Range   Influenza A, POC Positive (A) Negative   Influenza B, POC Negative Negative  POCT CBC     Status: None   Collection Time: 03/22/15 12:03 PM  Result Value Ref Range   WBC 8.8 4.6 - 10.2 K/uL   Lymph, poc 1.0 0.6 - 3.4   POC LYMPH PERCENT 11.5 10 - 50 %L   MID (cbc) 0.9 0 - 0.9   POC MID % 10.2 0 - 12 %M   POC Granulocyte 6.9 2 - 6.9   Granulocyte percent 78.3 37 - 80 %G   RBC 5.12 4.69 - 6.13 M/uL   Hemoglobin 15.6 14.1 - 18.1 g/dL   HCT, POC 44.9 43.5 - 53.7 %   MCV 87.6 80 - 97 fL   MCH, POC 30.5 27 - 31.2 pg   MCHC 34.9 31.8 - 35.4 g/dL   RDW, POC 12.6 %   Platelet Count, POC 211 142 - 424 K/uL   MPV 5.9 0 - 99.8 fL   [facility] reading (PRIMARY) by  PA Joshua [name]: STAT read please comment.    ASSESSMENT AND PLAN  Joshua [name] was seen today for generalized body aches, fatigue, fever and cough.  Diagnoses and all orders for this visit:  Cardiac arrhythmia, unspecified cardiac arrhythmia type: Please see Dr. Clayborn [name] not and appreciate his guidance.   -     EKG 12-Lead  Influenza-like illness: -     POCT Influenza A/B -     POCT CBC -     DG Chest 2 View; Future  Influenza A: Total duration of illness greater than 48 hours and Tamiflu will be of decreased benefit, however this patient has multiple co-morbidities.  My hope is that Tamiflu with decrease the flu associated sequelae.  Advised that if he has increasing chest pain to go directly to the ED.  If not improving but symptoms manageable RTC to see Dr. Linna [name] tomorrow.  Tylenol only for fever.  -     oseltamivir (TAMIFLU) 75 MG capsule; Take 1 capsule (75 mg total) by mouth 2 (two) times daily.    The patient was advised to call or return to clinic if he does not see an improvement in symptoms or to seek the care of the closest emergency department if he worsens with the above plan.    Joshua [name], MHS, PA-C [facility] 03/22/2015 12:04 PM

## 2015-03-26 ENCOUNTER — Encounter: Payer: Self-pay | Admitting: Family Medicine

## 2015-03-26 ENCOUNTER — Telehealth: Payer: Self-pay | Admitting: Family Medicine

## 2015-03-26 ENCOUNTER — Ambulatory Visit (INDEPENDENT_AMBULATORY_CARE_PROVIDER_SITE_OTHER): Payer: Medicare Other | Admitting: Family Medicine

## 2015-03-26 VITALS — BP 120/84 | HR 77 | Temp 97.9°F | Wt 202.0 lb

## 2015-03-26 DIAGNOSIS — J4541 Moderate persistent asthma with (acute) exacerbation: Secondary | ICD-10-CM | POA: Diagnosis not present

## 2015-03-26 DIAGNOSIS — J09X2 Influenza due to identified novel influenza A virus with other respiratory manifestations: Secondary | ICD-10-CM | POA: Diagnosis not present

## 2015-03-26 MED ORDER — PREDNISONE 20 MG PO TABS: ORAL_TABLET | ORAL | Status: DC

## 2015-03-26 NOTE — Telephone Encounter (Signed)
Patient Name: Joshua [name]  DOB: November 04, 1940    Initial Comment Caller states called in Thursday ; still has bad cough; no fever; tired; weak   Nurse Assessment      Guidelines    Guideline Title Affirmed Question Affirmed Notes  Influenza Follow-up Call Chest pain (Exception: MILD central chest pain, present only when coughing)    Final Disposition User   Go to ED Now Orvan Seen, RN, Jacquilin    Referrals  GO TO FACILITY REFUSED  [facility] REFUSED   Disagree/Comply: Disagree  Disagree/Comply Reason: Disagree with instructions     Spoke with Estill Bamberg in the office- She states the MD states i can put him in at 76 with Dr. Yong Channel

## 2015-03-26 NOTE — Progress Notes (Signed)
PCP: [name], MD  Subjective:  Joshua [name] is a 75 y.o. year old very pleasant male patient who presents with Influenza A symptoms including body aches, cough, fever now resolved, nasal congestion, sore throat, mild shortness of breath and wheezing improving and cough -started: last Thursday suddenly - symptoms are improving. Seen by urgent care on 03/22/15 -previous treatments: on tamiflu currently -sick contacts/travel/risks: unclear when he was exposed to flu  ROS-denies fever (had previously now resolved) NVD, tooth pain  Pertinent Past Medical History-  Patient Active Problem List   Diagnosis Date Noted  . Coronary atherosclerosis 10/12/2006    Priority: High  . Irritable bowel syndrome 10/12/2006    Priority: High  . CKD (chronic kidney disease), stage III 10/23/2013    Priority: Medium  . Neuropathy ([place]) 08/21/2013    Priority: Medium  . 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 Essential hypertension 02/21/2007    Priority: Medium  . Hyperlipidemia 10/12/2006    Priority: Medium  . DEPRESSION 10/12/2006    Priority: Medium  . Asthma 10/12/2006    Priority: Medium  . Allergic rhinitis 05/31/2014    Priority: Low  . Fatigue 11/09/2013    Priority: Low  . Dizziness 08/23/2013    Priority: Low  . RLS (restless legs syndrome) 01/03/2013    Priority: Low  . Low back pain radiating to left leg 10/09/2011    Priority: Low  . OSTEOARTHRITIS, LOWER LEG, LEFT 10/04/2009    Priority: Low  . TOBACCO ABUSE, HX OF 12/26/2008    Priority: Low  . ANEMIA, IRON DEFICIENCY 11/06/2008    Priority: Low  . 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 OSA (obstructive sleep apnea) 11/02/2006    Priority: Low  . GERD 10/12/2006    Priority: Low  . Hereditary and idiopathic peripheral neuropathy 09/25/2014   Medications- reviewed  Current Outpatient Prescriptions  Medication Sig Dispense Refill  . aspirin EC 81 MG  tablet Take 81 mg by mouth daily.    . Cyanocobalamin (VITAMIN B 12 PO) Take 800 mg by mouth daily.    [name] desloratadine (CLARINEX) 5 MG tablet Take 5 mg by mouth at bedtime.     . DULoxetine (CYMBALTA) 20 MG capsule Take 1 capsule (20 mg total) by mouth at bedtime. 30 capsule 3  . FLOVENT HFA 110 MCG/ACT inhaler Inhale 2 puffs into the lungs 2 (two) times daily. Reported on 03/22/2015  5  . fluticasone (FLONASE) 50 MCG/ACT nasal spray Place 1 spray into both nostrils 2 (two) times daily.     . folic acid (FOLVITE) A999333 MCG tablet Take 400 mcg by mouth daily.    [name] gabapentin (NEURONTIN) 100 MG capsule Take 2 caps in AM, 1 cap in evening, 2 caps at bedtime 150 capsule 11  . LAC-HYDRIN FIVE 5 % LOTN lotion APPLY TO AFFECTED AREA(S) TWICE DAILY 120 mL 2  . nortriptyline (PAMELOR) 10 MG capsule Take 1 capsule at night for 1 week, then increase to 2 capsules at night 60 capsule 4  . omeprazole (PRILOSEC) 20 MG capsule TAKE ONE CAPSULE BY MOUTH DAILY AS NEEDED FOR ACID REFLUX/HEARTBURN 90 capsule 2  . oseltamivir (TAMIFLU) 75 MG capsule Take 1 capsule (75 mg total) by mouth 2 (two) times daily. 20 capsule 0  . Probiotic Product (ALIGN) 4 MG CAPS Take 4 mg by mouth daily.     . simvastatin (ZOCOR) 20 MG tablet TAKE ONE TABLET BY  MOUTH AT BEDTIME 30 tablet 5  . valsartan-hydrochlorothiazide (DIOVAN-HCT) 320-25 MG per tablet Take 1 tablet by mouth daily. 30 tablet 5  . acetaminophen (TYLENOL) 500 MG tablet Take 1,000 mg by mouth every 6 (six) hours as needed for headache (pain). Reported on 03/26/2015    . albuterol (PROAIR HFA) 108 (90 BASE) MCG/ACT inhaler Inhale 1-2 puffs into the lungs every 6 (six) hours as needed for wheezing or shortness of breath. (Patient not taking: Reported on 03/26/2015) 18 g 3  . diazepam (VALIUM) 5 MG tablet Take 1 tablet (5 mg total) by mouth at bedtime as needed for anxiety (sleep). (Patient not taking: Reported on 03/26/2015) 30 tablet 2  . ondansetron (ZOFRAN) 8 MG tablet Reported  on 03/26/2015  3  . polyethylene glycol powder (GLYCOLAX/MIRALAX) powder Take 17 g by mouth daily. (Patient not taking: Reported on 03/22/2015) 255 g 0  . predniSONE (DELTASONE) 20 MG tablet Take 2 pills for 3 days, 1 pill for 4 days 10 tablet 0  . traMADol (ULTRAM) 50 MG tablet 50 mg. Reported on 03/26/2015  1   No current facility-administered medications for this visit.    Objective: BP 120/84 mmHg  Pulse 77  Temp(Src) 97.9 F (36.6 C)  Wt 202 lb (91.627 kg)  SpO2 97% Gen: NAD, appears fatigued, wearing mask HEENT: Turbinates erythematous,  pharynx mildly erythematous with no tonsilar exudate or edema, no sinus tenderness CV: RRR no murmurs rubs or gallops Lungs: diffuse mild wheeze but good air movement. No crackles, rhonchi Abdomen: soft/nontender/nondistended/normal bowel sounds. No rebound or guarding.  Skin: warm, dry, no rash Neuro: grossly normal, moves all extremities  Assessment/Plan:  Influenza A Confirmed at urgent care .Patient presented today due to concern of continued symptoms. We discussed this would likely last 2-3 weeks. He has some upper chest pain when he coughs and reproducible on exam with MSK cause from cough. Does seem to have flared asthma so we will use course of prednisone. Glad symptoms improving but discussed still likely to last close to 2 weeks.   Symptomatic treatment with: prednisone and albuterol q6 hours for 24 hours as well as continued tamiflu use.   Finally, we reviewed reasons to return to care including if symptoms worsen or persist or new concerns arise.  Meds ordered this encounter  Medications  . predniSONE (DELTASONE) 20 MG tablet    Sig: Take 2 pills for 3 days, 1 pill for 4 days    Dispense:  10 tablet    Refill:  0

## 2015-03-26 NOTE — Patient Instructions (Signed)
Flu lasts 2-3 weeks. The tamiflu you are taking may reduce your duration by a day or two  Seems to have flared your asthma- prednisone to help with this. Use albuterol every 6 hours for next 24 hours then as needed  Follow up if recurrent fever, continued symptoms beyond 3 weeks, other new symptoms or any concerns

## 2015-03-26 NOTE — Telephone Encounter (Signed)
noted 

## 2015-04-02 ENCOUNTER — Other Ambulatory Visit: Payer: Self-pay | Admitting: Family Medicine

## 2015-04-03 ENCOUNTER — Telehealth: Payer: Self-pay | Admitting: Family Medicine

## 2015-04-03 MED ORDER — VALSARTAN-HYDROCHLOROTHIAZIDE 320-25 MG PO TABS: 1.0000 | ORAL_TABLET | Freq: Every day | ORAL | Status: DC

## 2015-04-03 NOTE — Telephone Encounter (Signed)
Pt had to change pharm due to insurance. Pt needs new rx valsartan-hctz 320 -25 mg #30 w/refills send to [facility]

## 2015-04-03 NOTE — Telephone Encounter (Signed)
Medication has been sent.  

## 2015-04-09 DIAGNOSIS — J3081 Allergic rhinitis due to animal (cat) (dog) hair and dander: Secondary | ICD-10-CM | POA: Diagnosis not present

## 2015-04-09 DIAGNOSIS — J301 Allergic rhinitis due to pollen: Secondary | ICD-10-CM | POA: Diagnosis not present

## 2015-04-09 DIAGNOSIS — J3089 Other allergic rhinitis: Secondary | ICD-10-CM | POA: Diagnosis not present

## 2015-04-11 ENCOUNTER — Encounter: Payer: Self-pay | Admitting: Family Medicine

## 2015-04-11 NOTE — Telephone Encounter (Signed)
Pt has not received first message on my chart from Dr Yong Channel. Would like to hear something back if possible. No chest pain, just does not feel good. No fever, just feels bad.

## 2015-04-12 ENCOUNTER — Ambulatory Visit (INDEPENDENT_AMBULATORY_CARE_PROVIDER_SITE_OTHER): Payer: Medicare Other | Admitting: Family Medicine

## 2015-04-12 ENCOUNTER — Encounter: Payer: Self-pay | Admitting: Family Medicine

## 2015-04-12 VITALS — BP 108/70 | HR 102 | Temp 98.6°F | Wt 202.0 lb

## 2015-04-12 DIAGNOSIS — R6883 Chills (without fever): Secondary | ICD-10-CM

## 2015-04-12 DIAGNOSIS — R5383 Other fatigue: Secondary | ICD-10-CM

## 2015-04-12 DIAGNOSIS — R509 Fever, unspecified: Secondary | ICD-10-CM | POA: Diagnosis not present

## 2015-04-12 NOTE — Patient Instructions (Signed)
Sounds like you had a 24 hour bug while recovering from the flu. As far as weakness/fatigue- hopeful that will slowly improve over the coming month. There is definitely some deconditioning that goes on from being sick. I want you to try to be active at least 3 hours a day each day and hopefully slowly build back up to full day of work during this time.

## 2015-04-12 NOTE — Progress Notes (Signed)
Joshua Reddish, MD  Subjective:  Joshua [name] is a 75 y.o. year old very pleasant male patient who presents for/with See problem oriented charting ROS- see HPI ros  Past Medical History-  Patient Active Problem List   Diagnosis Date Noted  . Coronary atherosclerosis 10/12/2006    Priority: High  . Irritable bowel syndrome 10/12/2006    Priority: High  . CKD (chronic kidney disease), stage III 10/23/2013    Priority: Medium  . Neuropathy ([place]) 08/21/2013    Priority: Medium  . 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 Essential hypertension 02/21/2007    Priority: Medium  . Hyperlipidemia 10/12/2006    Priority: Medium  . DEPRESSION 10/12/2006    Priority: Medium  . Asthma 10/12/2006    Priority: Medium  . Allergic rhinitis 05/31/2014    Priority: Low  . Fatigue 11/09/2013    Priority: Low  . Dizziness 08/23/2013    Priority: Low  . RLS (restless legs syndrome) 01/03/2013    Priority: Low  . Low back pain radiating to left leg 10/09/2011    Priority: Low  . OSTEOARTHRITIS, LOWER LEG, LEFT 10/04/2009    Priority: Low  . TOBACCO ABUSE, HX OF 12/26/2008    Priority: Low  . ANEMIA, IRON DEFICIENCY 11/06/2008    Priority: Low  . 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 OSA (obstructive sleep apnea) 11/02/2006    Priority: Low  . GERD 10/12/2006    Priority: Low  . Hereditary and idiopathic peripheral neuropathy 09/25/2014    Medications- reviewed and updated Current Outpatient Prescriptions  Medication Sig Dispense Refill  . aspirin EC 81 MG tablet Take 81 mg by mouth daily.    . Cyanocobalamin (VITAMIN B 12 PO) Take 800 mg by mouth daily.    [name] desloratadine (CLARINEX) 5 MG tablet Take 5 mg by mouth at bedtime.     [name] FLOVENT HFA 110 MCG/ACT inhaler Inhale 2 puffs into the lungs 2 (two) times daily. Reported on 03/22/2015  5  . fluticasone (FLONASE) 50 MCG/ACT nasal spray Place 1 spray into both nostrils 2  (two) times daily.     . folic acid (FOLVITE) A999333 MCG tablet Take 400 mcg by mouth daily.    [name] gabapentin (NEURONTIN) 100 MG capsule Take 2 caps in AM, 1 cap in evening, 2 caps at bedtime 150 capsule 11  . LAC-HYDRIN FIVE 5 % LOTN lotion APPLY TO AFFECTED AREA(S) TWICE DAILY 120 mL 2  . omeprazole (PRILOSEC) 20 MG capsule TAKE ONE CAPSULE BY MOUTH DAILY AS NEEDED FOR ACID REFLUX/HEARTBURN 90 capsule 2  . Probiotic Product (ALIGN) 4 MG CAPS Take 4 mg by mouth daily.     . simvastatin (ZOCOR) 20 MG tablet TAKE ONE TABLET BY MOUTH AT BEDTIME 30 tablet 5  . valsartan-hydrochlorothiazide (DIOVAN-HCT) 320-25 MG tablet Take 1 tablet by mouth daily. 30 tablet 5  . acetaminophen (TYLENOL) 500 MG tablet Take 1,000 mg by mouth every 6 (six) hours as needed for headache (pain). Reported on 04/12/2015    . albuterol (PROAIR HFA) 108 (90 BASE) MCG/ACT inhaler Inhale 1-2 puffs into the lungs every 6 (six) hours as needed for wheezing or shortness of breath. (Patient not taking: Reported on 03/26/2015) 18 g 3  . diazepam (VALIUM) 5 MG tablet Take 1 tablet (5 mg total) by mouth at bedtime as needed for anxiety (sleep). (Patient not taking: Reported on 03/26/2015) 30 tablet 2  . ondansetron (ZOFRAN)  8 MG tablet Reported on 04/12/2015  3  . polyethylene glycol powder (GLYCOLAX/MIRALAX) powder Take 17 g by mouth daily. (Patient not taking: Reported on 03/22/2015) 255 g 0  . traMADol (ULTRAM) 50 MG tablet 50 mg. Reported on 04/12/2015  1   No current facility-administered medications for this visit.    Objective: BP 108/70 mmHg  Pulse 102  Temp(Src) 98.6 F (37 C)  Wt 202 lb (91.627 kg)  SpO2 97% Gen: NAD, resting comfortably, well appearing- appears at his baseline TM normal, oropharynx normal CV: RRR no murmurs rubs or gallops Lungs: CTAB no crackles, wheeze, rhonchi Abdomen: soft/nontender/nondistended/normal bowel sounds. No rebound or guarding.  Ext: no edema Skin: warm, dry Neuro: grossly normal, moves all  extremities  Assessment/Plan:  Subjective fever/chills < 12 hours Ongoing fatigue after influenza  S: Patient was diagnosed with the flu on 03/26/15. He had been taking it easy at home but tthen decided to go back to work. Per [employer] " When I got home I was exhausted went to bed about 8:30. Woke around 2 with severe chills and shaking. Took two tylenol and a 5mg  Diazepam. Woke at 5:45 and was extremely weak. Could barley get to bathroom and back to bed. Laid there for about 15 min. got up made coffee and breakfast. Still very tired and weak. Any suggestions as to what may be the problem? " and later "Also wanted to tell you that when I woke up I was drenched in sweat. Still flu? ".   He reiterates to me verbally today his episode of exhaustion, chills, and shaking 2 evenings ago waking up from sleep after working 6.5 hours on Wednesday and feeling tired when he got home. Played poker on Monday night.  He felt as if he could not get to the bathroom due to weakness but was able to. He also had an episode of drenching night sweats in the morning. He has not had recurrence of fever/chills but does feel slightly weaker- he had been slowly recovering from flu.  He does have known CAD with CABG x5 and he had a stress test 02/2014 which was normal. He denies any chest pain or shortness of breath.  ROS- no cough, congestion, vomiting, diarrhea  A/P: 74 year old male with <12 hours of subjective fever/chills while recovering from the flu. He did have potential new sick contacts and suspect a different viral illness than influenza. He appears well on exam. We discussed a graduated activity schedule as he has had a hard time recovering from the fatigue of the flu. No source of illness found on exam.   Return precautions advised.   >50% of 20 minute office visit was spent on counseling (comforting patient that this is not continued flu, planning graduated activity level, explaining deconditioning)  and  coordination of care

## 2015-04-15 ENCOUNTER — Telehealth: Payer: Self-pay | Admitting: Family Medicine

## 2015-04-15 ENCOUNTER — Encounter: Payer: Self-pay | Admitting: Family Medicine

## 2015-04-15 ENCOUNTER — Ambulatory Visit (INDEPENDENT_AMBULATORY_CARE_PROVIDER_SITE_OTHER): Payer: Medicare Other | Admitting: Family Medicine

## 2015-04-15 VITALS — BP 140/80 | HR 82 | Temp 98.0°F | Wt 204.0 lb

## 2015-04-15 DIAGNOSIS — R0789 Other chest pain: Secondary | ICD-10-CM | POA: Diagnosis not present

## 2015-04-15 DIAGNOSIS — R5383 Other fatigue: Secondary | ICD-10-CM | POA: Diagnosis not present

## 2015-04-15 LAB — COMPREHENSIVE METABOLIC PANEL
ALT: 21 U/L (ref 0–53)
AST: 16 U/L (ref 0–37)
Albumin: 4.1 g/dL (ref 3.5–5.2)
Alkaline Phosphatase: 65 U/L (ref 39–117)
BUN: 19 mg/dL (ref 6–23)
CO2: 30 mEq/L (ref 19–32)
Calcium: 9.7 mg/dL (ref 8.4–10.5)
Chloride: 98 mEq/L (ref 96–112)
Creatinine, Ser: 1.27 mg/dL (ref 0.40–1.50)
GFR: 59.22 mL/min — ABNORMAL LOW (ref >60.00)
Glucose, Bld: 102 mg/dL — ABNORMAL HIGH (ref 70–99)
Potassium: 4.8 mEq/L (ref 3.5–5.1)
Sodium: 137 mEq/L (ref 135–145)
Total Bilirubin: 0.9 mg/dL (ref 0.2–1.2)
Total Protein: 7 g/dL (ref 6.0–8.3)

## 2015-04-15 LAB — CBC WITH DIFFERENTIAL/PLATELET
Basophils Absolute: 0 10*3/uL (ref 0.0–0.1)
Basophils Relative: 0.6 % (ref 0.0–3.0)
Eosinophils Absolute: 0.3 10*3/uL (ref 0.0–0.7)
Eosinophils Relative: 4.3 % (ref 0.0–5.0)
HCT: 45.2 % (ref 39.0–52.0)
Hemoglobin: 15.1 g/dL (ref 13.0–17.0)
Lymphocytes Relative: 20.7 % (ref 12.0–46.0)
Lymphs Abs: 1.5 10*3/uL (ref 0.7–4.0)
MCHC: 33.4 g/dL (ref 30.0–36.0)
MCV: 90.7 fl (ref 78.0–100.0)
Monocytes Absolute: 0.8 10*3/uL (ref 0.1–1.0)
Monocytes Relative: 10.7 % (ref 3.0–12.0)
Neutro Abs: 4.6 10*3/uL (ref 1.4–7.7)
Neutrophils Relative %: 63.7 % (ref 43.0–77.0)
Platelets: 264 10*3/uL (ref 150.0–400.0)
RBC: 4.99 Mil/uL (ref 4.22–5.81)
RDW: 12.8 % (ref 11.5–15.5)
WBC: 7.2 10*3/uL (ref 4.0–10.5)

## 2015-04-15 LAB — TSH: TSH: 0.92 u[IU]/mL (ref 0.35–4.50)

## 2015-04-15 NOTE — Patient Instructions (Signed)
Labs before you go  Suspect chest pain is from chest wall pain (costochondritis- can ice this joint 3x a day for 20 minutes)  EKG is reassuring but I still want you to have cardiology follow up given atypical symptoms in the past leading to bypass. Bevelyn Ngo tried to call but did not have success. I would ask you to call and try to get seen by the end of the week. We will try again in a few minutes.

## 2015-04-15 NOTE — Telephone Encounter (Signed)
Appointment scheduled today at 4:15 with Dr. Yong Channel.

## 2015-04-15 NOTE — Progress Notes (Signed)
Joshua Reddish, MD  Subjective:  Joshua [name] is a 75 y.o. year old very pleasant male patient who presents for/with See problem oriented charting ROS-  No  shortness of breath. No headache or blurry vision. No further fever.    Past Medical History-  Patient Active Problem List   Diagnosis Date Noted  . Coronary atherosclerosis 10/12/2006    Priority: High  . Irritable bowel syndrome 10/12/2006    Priority: High  . CKD (chronic kidney disease), stage III 10/23/2013    Priority: Medium  . Neuropathy ([place]) 08/21/2013    Priority: Medium  . 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 Essential hypertension 02/21/2007    Priority: Medium  . Hyperlipidemia 10/12/2006    Priority: Medium  . DEPRESSION 10/12/2006    Priority: Medium  . Asthma 10/12/2006    Priority: Medium  . Allergic rhinitis 05/31/2014    Priority: Low  . Fatigue 11/09/2013    Priority: Low  . Dizziness 08/23/2013    Priority: Low  . RLS (restless legs syndrome) 01/03/2013    Priority: Low  . Low back pain radiating to left leg 10/09/2011    Priority: Low  . OSTEOARTHRITIS, LOWER LEG, LEFT 10/04/2009    Priority: Low  . TOBACCO ABUSE, HX OF 12/26/2008    Priority: Low  . ANEMIA, IRON DEFICIENCY 11/06/2008    Priority: Low  . 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 OSA (obstructive sleep apnea) 11/02/2006    Priority: Low  . GERD 10/12/2006    Priority: Low  . Hereditary and idiopathic peripheral neuropathy 09/25/2014    Medications- reviewed and updated Current Outpatient Prescriptions  Medication Sig Dispense Refill  . aspirin EC 81 MG tablet Take 81 mg by mouth daily.    . Cyanocobalamin (VITAMIN B 12 PO) Take 800 mg by mouth daily.    [name] desloratadine (CLARINEX) 5 MG tablet Take 5 mg by mouth at bedtime.     [name] FLOVENT HFA 110 MCG/ACT inhaler Inhale 2 puffs into the lungs 2 (two) times daily. Reported on 03/22/2015  5  . fluticasone  (FLONASE) 50 MCG/ACT nasal spray Place 1 spray into both nostrils 2 (two) times daily.     . folic acid (FOLVITE) A999333 MCG tablet Take 400 mcg by mouth daily.    [name] gabapentin (NEURONTIN) 100 MG capsule Take 2 caps in AM, 1 cap in evening, 2 caps at bedtime 150 capsule 11  . LAC-HYDRIN FIVE 5 % LOTN lotion APPLY TO AFFECTED AREA(S) TWICE DAILY 120 mL 2  . omeprazole (PRILOSEC) 20 MG capsule TAKE ONE CAPSULE BY MOUTH DAILY AS NEEDED FOR ACID REFLUX/HEARTBURN 90 capsule 2  . Probiotic Product (ALIGN) 4 MG CAPS Take 4 mg by mouth daily.     . simvastatin (ZOCOR) 20 MG tablet TAKE ONE TABLET BY MOUTH AT BEDTIME 30 tablet 5  . valsartan-hydrochlorothiazide (DIOVAN-HCT) 320-25 MG tablet Take 1 tablet by mouth daily. 30 tablet 5  . acetaminophen (TYLENOL) 500 MG tablet Take 1,000 mg by mouth every 6 (six) hours as needed for headache (pain). Reported on 04/15/2015    . albuterol (PROAIR HFA) 108 (90 BASE) MCG/ACT inhaler Inhale 1-2 puffs into the lungs every 6 (six) hours as needed for wheezing or shortness of breath. (Patient not taking: Reported on 04/15/2015) 18 g 3  . diazepam (VALIUM) 5 MG tablet Take 1 tablet (5 mg total) by mouth at bedtime as needed for anxiety (sleep). (  Patient not taking: Reported on 03/26/2015) 30 tablet 2  . ondansetron (ZOFRAN) 8 MG tablet Reported on 04/15/2015  3  . polyethylene glycol powder (GLYCOLAX/MIRALAX) powder Take 17 g by mouth daily. (Patient not taking: Reported on 03/22/2015) 255 g 0  . traMADol (ULTRAM) 50 MG tablet 50 mg. Reported on 04/15/2015  1   No current facility-administered medications for this visit.    Objective: BP 140/80 mmHg  Pulse 82  Temp(Src) 98 F (36.7 C)  Wt 204 lb (92.534 kg)  SpO2 95% Gen: NAD, resting comfortably CV: RRR no murmurs rubs or gallops Pain along left chest about 4th rib junction with sternum Lungs: CTAB no crackles, wheeze, rhonchi Abdomen: soft/nontender/nondistended/normal bowel sounds. No rebound or guarding.  Ext: no  edema Skin: warm, dry Neuro: grossly normal, moves all extremities  EKG- normal sinus rhythm, rate 78, normal axis, normal intervals, no hypertrophy, no st or t wave changes, q wave in v1 unchanged from previous EKG 11/15/14 by cardiology.   Assessment/Plan:  Fatigue, chest pain S: Patient wit influenza diagnosed at urgent care 03/22/15 with CXR normal at that time. Symptoms had started 12/28. I treated him on 03/26/15 for a flare of his asthma which improved. He took tamiflu. I saw patient on 04/12/15 for 12 hours of subjective fever and chills- he may have been reexposed and had a separate viral illness but unclear. He complainsof continued fatigue that had never improved. Of note, he states he felt similar to this before he had his CABG several years ago (states fatigued, no energy, just felt off)- very fatigued although this seems to be a recurrent issue for patient just perhaps not at degree of last few weeks. He does feel very cold and has some chills but no fever. He denies shortness of breath, cough, congestion. He does complain of 1 day of mid sternum mild pressure that is not worse activity or better with rest.  A/P:We had discussed updating bloodwork last Friday and patient declined. He opted in today with continued fatigue and bloodwork came back unremarkable. With fatigue and chest pain, tested EKG= shows no significant change from prior. Given atypical presentation before CABG and similar symptoms (severe fatigue, feeling off per patient) we will get him plugged back into cardiology for their opinion though I have low suspicion cardiac. I suspect his current chest pain is from costochondritis as easily reproducible . May just be slow post viral recovery  Return precautions advised.   Orders Placed This Encounter  Procedures  . CBC with Differential/Platelet  . Comprehensive metabolic panel    [place]  . TSH    [place]  . EKG 12-Lead   Results for orders placed or performed in visit  on 04/15/15 (from the past 24 hour(s))  CBC with Differential/Platelet     Status: None   Collection Time: 04/15/15  1:12 PM  Result Value Ref Range   WBC 7.2 4.0 - 10.5 K/uL   RBC 4.99 4.22 - 5.81 Mil/uL   Hemoglobin 15.1 13.0 - 17.0 g/dL   HCT 45.2 39.0 - 52.0 %   MCV 90.7 78.0 - 100.0 fl   MCHC 33.4 30.0 - 36.0 g/dL   RDW 12.8 11.5 - 15.5 %   Platelets 264.0 150.0 - 400.0 K/uL   Neutrophils Relative % 63.7 43.0 - 77.0 %   Lymphocytes Relative 20.7 12.0 - 46.0 %   Monocytes Relative 10.7 3.0 - 12.0 %   Eosinophils Relative 4.3 0.0 - 5.0 %   Basophils Relative  0.6 0.0 - 3.0 %   Neutro Abs 4.6 1.4 - 7.7 K/uL   Lymphs Abs 1.5 0.7 - 4.0 K/uL   Monocytes Absolute 0.8 0.1 - 1.0 K/uL   Eosinophils Absolute 0.3 0.0 - 0.7 K/uL   Basophils Absolute 0.0 0.0 - 0.1 K/uL  Comprehensive metabolic panel     Status: Abnormal   Collection Time: 04/15/15  1:12 PM  Result Value Ref Range   Sodium 137 135 - 145 mEq/L   Potassium 4.8 3.5 - 5.1 mEq/L   Chloride 98 96 - 112 mEq/L   CO2 30 19 - 32 mEq/L   Glucose, Bld 102 (H) 70 - 99 mg/dL   BUN 19 6 - 23 mg/dL   Creatinine, Ser 1.27 0.40 - 1.50 mg/dL   Total Bilirubin 0.9 0.2 - 1.2 mg/dL   Alkaline Phosphatase 65 39 - 117 U/L   AST 16 0 - 37 U/L   ALT 21 0 - 53 U/L   Total Protein 7.0 6.0 - 8.3 g/dL   Albumin 4.1 3.5 - 5.2 g/dL   Calcium 9.7 8.4 - 10.5 mg/dL   GFR 59.22 (L) >60.00 mL/min  TSH     Status: None   Collection Time: 04/15/15  1:12 PM  Result Value Ref Range   TSH 0.92 0.35 - 4.50 uIU/mL

## 2015-04-15 NOTE — Telephone Encounter (Signed)
[facility] - Client [facility] Patient Name: Joshua [name] DOB: 1940/12/04 Initial Comment Caller states has been seen twice in the last couple of weeks for flu and some kind of infection. Still not feeling well. still weak, skin burns Nurse Assessment Nurse: Markus Daft, RN, [place] Date/Time (Eastern Time): 04/15/2015 8:43:42 AM Confirm and document reason for call. If symptomatic, describe symptoms. You must click the next button to save text entered. ---Caller states has been seen twice in the last couple of weeks for flu - chills/fever one night briefly, cough. Seen by MD on Friday. And now he very cold most of the time, no energy, fatigue weakness. No chills the last 2 days. No fever. Denies runny nose/ cough. He has some chest pressure and no difficulty breathing. His skin has a "burning pain" on his left hip/buttock area. Has the patient traveled out of the country within the last 30 days? ---Not Applicable Does the patient have any new or worsening symptoms? ---Yes Will a triage be completed? ---Yes Related visit to physician within the last 2 weeks? ---Yes Does the PT have any chronic conditions? (i.e. diabetes, asthma, etc.) ---Yes List chronic conditions. ---CABG x 5 vessels in 2001 - CAD; HTN; Asthma - no wheezing currently Is this a behavioral health or substance abuse call? ---No Guidelines Guideline Title Affirmed Question Affirmed Notes Chest Pain [1] Chest pain lasts > 5 minutes AND [2] history of heart disease (i.e., heart attack, bypass surgery, angina, angioplasty, CHF; not just a heart murmur) Final Disposition User Call EMS 911 Now Markus Daft, RN, [place] REFUSED Disagree/Comply: Disagree Disagree/Comply Reason: Disagree with instructions He had s/s for last 4 wks.  He's had the chest pressure since this started.  He understand the advice for 911, but will not be  calling them.  He is more concerned about the fatigue and skin burning pain (no rash).  Please call patient.

## 2015-04-24 ENCOUNTER — Encounter: Payer: Self-pay | Admitting: Physician Assistant

## 2015-04-24 ENCOUNTER — Ambulatory Visit (INDEPENDENT_AMBULATORY_CARE_PROVIDER_SITE_OTHER): Payer: Medicare Other | Admitting: Physician Assistant

## 2015-04-24 ENCOUNTER — Telehealth: Payer: Self-pay | Admitting: Family Medicine

## 2015-04-24 VITALS — BP 114/70 | HR 92 | Ht 65.75 in | Wt 205.4 lb

## 2015-04-24 DIAGNOSIS — I10 Essential (primary) hypertension: Secondary | ICD-10-CM

## 2015-04-24 DIAGNOSIS — I2510 Atherosclerotic heart disease of native coronary artery without angina pectoris: Secondary | ICD-10-CM

## 2015-04-24 DIAGNOSIS — E785 Hyperlipidemia, unspecified: Secondary | ICD-10-CM | POA: Diagnosis not present

## 2015-04-24 DIAGNOSIS — R0789 Other chest pain: Secondary | ICD-10-CM | POA: Diagnosis not present

## 2015-04-24 NOTE — Telephone Encounter (Signed)
FYI

## 2015-04-24 NOTE — Patient Instructions (Signed)
Your physician wants you to follow-up in: AUGUST with Dr. Allena Napoleon will receive a reminder letter in the mail two months in advance. If you don't receive a letter, please call our office to schedule the follow-up appointment.

## 2015-04-24 NOTE — Progress Notes (Signed)
Patient ID: Joshua [name], male   DOB: [birth year], 75 y.o.   MRN: [id number]    Date:  04/24/2015   ID:  Joshua [name], DOB [birth year], MRN [id number]  PCP:  [name], MD  Primary Cardiologist:  [facility]  Chief Complaint  Patient presents with  . Follow-up     no chest pain, no shortness of breath, no edema, no pain or cramping in legs, occassional lightheadedness & dizziness     History of Present Illness: Joshua [name] is a 75 y.o. male  he was seen last in August 2016 he was having problems with sinusitis, some GI issues and unexplained pain in his left foot and was walking in a cast.  Patient presents for follow-up of chest pain. He reports she's had the flu for a month and this past Monday he's finally felt like a different person, meaning he feels much better.  He is having centralized, sharp, chest pain over sternum during the period when he had the flu. When Dr. Yong Channel pushed on it, it was severely exacerbated.  Since that time the pain is severely diminished. He has not been using any ibuprofen or Aleve. He has had some shortness of breath however, as he said, he's had the flu.  The pain never radiated to his arm neck back or jaw. He otherwise denies nausea, vomiting, fever, orthopnea, dizziness, PND, cough, congestion, abdominal pain, hematochezia, melena, lower extremity edema, claudication.  Wt Readings from Last 3 Encounters:  04/24/15 205 lb 6 oz (93.157 kg)  04/15/15 204 lb (92.534 kg)  04/12/15 202 lb (91.627 kg)     Past Medical History  Diagnosis Date  . Asthma   . CAD (coronary artery disease)     CABG 2001  . Depression   . GERD (gastroesophageal reflux disease)   . Hyperlipidemia   . IBS (irritable bowel syndrome)   . Acute medial meniscal tear   . Hypertension   . Anemia   . Diverticulosis   . Obesity   . Pancreatitis ~ 1980  . Acute sinusitis 12/14/2006    Qualifier: Diagnosis of  By: Arnoldo Morale MD, Balinda Quails   . Prostate cancer [facility]) 04/2010    . Bladder cancer ([place]) 04/2010    "cauterized during prostate OR"  . Pneumonia 1980's X 2  . OSA on CPAP   . History of blood transfusion 2001    "related to the bypass OR"  . DJD (degenerative joint disease)   . Arthritis     "middle finger right hand; right knee; neck" (02/06/2014)  . History of gout     Current Outpatient Prescriptions  Medication Sig Dispense Refill  . acetaminophen (TYLENOL) 500 MG tablet Take 1,000 mg by mouth every 6 (six) hours as needed for headache (pain). Reported on 04/15/2015    . albuterol (PROAIR HFA) 108 (90 BASE) MCG/ACT inhaler Inhale 1-2 puffs into the lungs every 6 (six) hours as needed for wheezing or shortness of breath. 18 g 3  . aspirin EC 81 MG tablet Take 81 mg by mouth daily.    . Cyanocobalamin (VITAMIN B 12 PO) Take 800 mg by mouth daily.    [name] desloratadine (CLARINEX) 5 MG tablet Take 5 mg by mouth at bedtime.     . diazepam (VALIUM) 5 MG tablet Take 1 tablet (5 mg total) by mouth at bedtime as needed for anxiety (sleep). 30 tablet 2  . FLOVENT HFA 110 MCG/ACT inhaler Inhale 2 puffs into the lungs 2 (two)  times daily. Reported on 03/22/2015  5  . fluticasone (FLONASE) 50 MCG/ACT nasal spray Place 1 spray into both nostrils 2 (two) times daily.     . folic acid (FOLVITE) A999333 MCG tablet Take 400 mcg by mouth daily.    [name] gabapentin (NEURONTIN) 100 MG capsule Take 2 caps in AM, 1 cap in evening, 2 caps at bedtime 150 capsule 11  . LAC-HYDRIN FIVE 5 % LOTN lotion APPLY TO AFFECTED AREA(S) TWICE DAILY 120 mL 2  . omeprazole (PRILOSEC) 20 MG capsule TAKE ONE CAPSULE BY MOUTH DAILY AS NEEDED FOR ACID REFLUX/HEARTBURN 90 capsule 2  . ondansetron (ZOFRAN) 8 MG tablet Reported on 04/15/2015  3  . polyethylene glycol powder (GLYCOLAX/MIRALAX) powder Take 17 g by mouth daily. 255 g 0  . Probiotic Product (ALIGN) 4 MG CAPS Take 4 mg by mouth daily.     . simvastatin (ZOCOR) 20 MG tablet TAKE ONE TABLET BY MOUTH AT BEDTIME 30 tablet 5  . traMADol (ULTRAM)  50 MG tablet 50 mg. Reported on 04/15/2015  1  . valsartan-hydrochlorothiazide (DIOVAN-HCT) 320-25 MG tablet Take 1 tablet by mouth daily. 30 tablet 5   No current facility-administered medications for this visit.    Allergies:    Allergies  Allergen Reactions  . Azithromycin Other (See Comments)    Dizziness and double vision   . 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 Bromfed Nausea And Vomiting  . Clarithromycin Other (See Comments)     gastritis  . Codeine Other (See Comments)    Trouble breathing  . Doxycycline Hyclate Nausea And Vomiting  . Modafinil Other (See Comments)     anxiety-nervousness  . Promethazine Hcl Other (See Comments)     fainting  . Quinolones     Cipro  . Telithromycin Nausea And Vomiting  . Erythromycin Base Rash  . Hydrocodone-Acetaminophen Rash  . Sulfonamide Derivatives Rash    Social History:  The patient  reports that he quit smoking about 22 years ago. His smoking use included Cigarettes. He has a 35 pack-year smoking history. He has never used smokeless tobacco. He reports that he drinks about 2.4 oz of alcohol per week. He reports that he does not use illicit drugs.   Family history:   Family History  Problem Relation Age of Onset  . Heart disease Father   . Hyperlipidemia Father   . Heart disease Mother   . Hyperlipidemia Mother   . Diabetes Brother     ROS:  Please see the history of present illness.  All other systems reviewed and negative.   PHYSICAL EXAM: VS:  BP 114/70 mmHg  Pulse 92  Ht 5' 5.75" (1.67 m)  Wt 205 lb 6 oz (93.157 kg)  BMI 33.40 kg/m2 Well nourished, well developed, in no acute distress HEENT: Pupils are equal round react to light accommodation extraocular movements are intact.  Neck: no JVDNo cervical lymphadenopathy. Cardiac: Regular rate and rhythm without murmurs rubs or gallops. Chest wall: Mild chest pain exacerbated with palpation left side  of the sternum Lungs:  clear to auscultation bilaterally, no wheezing, rhonchi or rales Abd: soft, nontender, positive bowel sounds all quadrants, no hepatosplenomegaly Ext: no lower extremity edema.  2+ radial and dorsalis pedis pulses. Skin: warm and dry Neuro:  Grossly normal  EKG:  Normal sinus rhythm with T-wave inversion inferiorly. No new changes.  ASSESSMENT AND PLAN:  Problem List Items Addressed This Visit  Musculoskeletal chest pain - Primary   Hyperlipidemia   Essential hypertension   Coronary atherosclerosis      Mr. Lemburg chest pain is clearly reproducible on the left edge of the sternum. He reports it's considerably better than had been a couple weeks ago when he was having the flu. He has not used any anti-inflammatory such as Aleve or ibuprofen. It is still mildly reproducible on exam. His blood pressure is well-controlled. He continues to take simvastatin for his lipids..  Do not think he is having any ischemic chest pain. He'll follow-up in August with Dr. Percival Spanish

## 2015-04-24 NOTE — Telephone Encounter (Signed)
fyi Pt is calling to tell md thanks. Pt had a good check up with cardiologist

## 2015-04-30 DIAGNOSIS — J3089 Other allergic rhinitis: Secondary | ICD-10-CM | POA: Diagnosis not present

## 2015-04-30 DIAGNOSIS — J301 Allergic rhinitis due to pollen: Secondary | ICD-10-CM | POA: Diagnosis not present

## 2015-04-30 DIAGNOSIS — J3081 Allergic rhinitis due to animal (cat) (dog) hair and dander: Secondary | ICD-10-CM | POA: Diagnosis not present

## 2015-05-13 DIAGNOSIS — J301 Allergic rhinitis due to pollen: Secondary | ICD-10-CM | POA: Diagnosis not present

## 2015-05-13 DIAGNOSIS — J3081 Allergic rhinitis due to animal (cat) (dog) hair and dander: Secondary | ICD-10-CM | POA: Diagnosis not present

## 2015-05-13 DIAGNOSIS — J3089 Other allergic rhinitis: Secondary | ICD-10-CM | POA: Diagnosis not present

## 2015-05-14 ENCOUNTER — Telehealth: Payer: Self-pay | Admitting: Family Medicine

## 2015-05-14 MED ORDER — DIAZEPAM 5 MG PO TABS: 5.0000 mg | ORAL_TABLET | Freq: Every evening | ORAL | Status: DC | PRN

## 2015-05-14 NOTE — Telephone Encounter (Signed)
Refill ok? 

## 2015-05-14 NOTE — Telephone Encounter (Signed)
Medication called in 

## 2015-05-14 NOTE — Telephone Encounter (Signed)
Yes thanks 

## 2015-05-14 NOTE — Telephone Encounter (Signed)
Pt request refill of the following: diazepam (VALIUM) 5 MG tablet   Phamacy: [facility] [address]

## 2015-05-16 ENCOUNTER — Encounter: Payer: Self-pay | Admitting: Neurology

## 2015-05-16 ENCOUNTER — Ambulatory Visit (INDEPENDENT_AMBULATORY_CARE_PROVIDER_SITE_OTHER): Payer: Medicare Other | Admitting: Neurology

## 2015-05-16 VITALS — BP 130/72 | HR 91 | Ht 67.0 in | Wt 205.0 lb

## 2015-05-16 DIAGNOSIS — I2510 Atherosclerotic heart disease of native coronary artery without angina pectoris: Secondary | ICD-10-CM

## 2015-05-16 DIAGNOSIS — G609 Hereditary and idiopathic neuropathy, unspecified: Secondary | ICD-10-CM | POA: Diagnosis not present

## 2015-05-16 MED ORDER — GABAPENTIN 100 MG PO CAPS: ORAL_CAPSULE | ORAL | Status: DC

## 2015-05-16 NOTE — Patient Instructions (Signed)
1. Continue Gabapentin 100mg : Take 2 caps in AM, 3 caps in PM 2. Continue alpha-lipoic acid 3. Follow-up in 8 months, call for any changes
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
## 2015-05-17 ENCOUNTER — Telehealth: Payer: Self-pay | Admitting: Family Medicine

## 2015-05-17 MED ORDER — OMEPRAZOLE 20 MG PO CPDR: DELAYED_RELEASE_CAPSULE | ORAL | Status: DC

## 2015-05-17 NOTE — Telephone Encounter (Signed)
Medication refilled

## 2015-05-17 NOTE — Telephone Encounter (Signed)
° ° °

## 2015-05-27 DIAGNOSIS — J3081 Allergic rhinitis due to animal (cat) (dog) hair and dander: Secondary | ICD-10-CM | POA: Diagnosis not present

## 2015-05-27 DIAGNOSIS — J3089 Other allergic rhinitis: Secondary | ICD-10-CM | POA: Diagnosis not present

## 2015-05-27 DIAGNOSIS — J301 Allergic rhinitis due to pollen: Secondary | ICD-10-CM | POA: Diagnosis not present

## 2015-05-30 ENCOUNTER — Ambulatory Visit (INDEPENDENT_AMBULATORY_CARE_PROVIDER_SITE_OTHER): Payer: Medicare Other | Admitting: Family Medicine

## 2015-05-30 ENCOUNTER — Encounter: Payer: Self-pay | Admitting: Family Medicine

## 2015-05-30 VITALS — BP 136/70 | HR 82 | Temp 98.7°F | Wt 208.0 lb

## 2015-05-30 DIAGNOSIS — I10 Essential (primary) hypertension: Secondary | ICD-10-CM

## 2015-05-30 DIAGNOSIS — E785 Hyperlipidemia, unspecified: Secondary | ICD-10-CM | POA: Diagnosis not present

## 2015-05-30 DIAGNOSIS — I2510 Atherosclerotic heart disease of native coronary artery without angina pectoris: Secondary | ICD-10-CM | POA: Diagnosis not present

## 2015-05-30 MED ORDER — SIMVASTATIN 40 MG PO TABS: 40.0000 mg | ORAL_TABLET | Freq: Every day | ORAL | Status: DC

## 2015-05-30 NOTE — Assessment & Plan Note (Signed)
S: Patient has remained chest pain free after recovering from flu/cough- likely was MSK strain. Saw cards and no workup advised A/P: continue aspirin, bump up statin if able

## 2015-05-30 NOTE — Assessment & Plan Note (Signed)
S: controlled on valsartan-hctz 320-25 BP Readings from Last 3 Encounters:  05/30/15 136/70  05/16/15 130/72  04/24/15 114/70  A/P:Continue current meds- doing well

## 2015-05-30 NOTE — Assessment & Plan Note (Signed)
S: mild poor control on Simvastatin 20mg  -->A/P: trial 40mg  if can tolerate (has had muscle weakness in past on other meds) to get LDL <70. Recheck LDL at follow up

## 2015-05-30 NOTE — Patient Instructions (Signed)
Increase Simvastatin to 40mg  as long as you can tolerate it (no increasing muscle aches, no worsening muscle fatigue)  Blood pressure looks great  Glad asthma is doing well still

## 2015-05-30 NOTE — Progress Notes (Signed)
[name], MD  Subjective:  Joshua [name] is a 75 y.o. year old very pleasant male patient who presents for/with See problem oriented charting ROS- No chest pain or shortness of breath. No headache or blurry vision.   Past Medical History-  Patient Active Problem List   Diagnosis Date Noted  . Coronary atherosclerosis 10/12/2006    Priority: High  . Irritable bowel syndrome 10/12/2006    Priority: High  . CKD (chronic kidney disease), stage III 10/23/2013    Priority: Medium  . Neuropathy ([place]) 08/21/2013    Priority: Medium  . 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 Essential hypertension 02/21/2007    Priority: Medium  . Hyperlipidemia 10/12/2006    Priority: Medium  . DEPRESSION 10/12/2006    Priority: Medium  . Asthma 10/12/2006    Priority: Medium  . Allergic rhinitis 05/31/2014    Priority: Low  . Fatigue 11/09/2013    Priority: Low  . Dizziness 08/23/2013    Priority: Low  . RLS (restless legs syndrome) 01/03/2013    Priority: Low  . Low back pain radiating to left leg 10/09/2011    Priority: Low  . OSTEOARTHRITIS, LOWER LEG, LEFT 10/04/2009    Priority: Low  . TOBACCO ABUSE, HX OF 12/26/2008    Priority: Low  . ANEMIA, IRON DEFICIENCY 11/06/2008    Priority: Low  . 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 OSA (obstructive sleep apnea) 11/02/2006    Priority: Low  . GERD 10/12/2006    Priority: Low  . Musculoskeletal chest pain 04/24/2015  . Hereditary and idiopathic peripheral neuropathy 09/25/2014    Medications- reviewed and updated Current Outpatient Prescriptions  Medication Sig Dispense Refill  . Alpha-Lipoic Acid 300 MG CAPS Take 1 capsule by mouth 2 (two) times daily.    [name] aspirin EC 81 MG tablet Take 81 mg by mouth daily.    . Cyanocobalamin (VITAMIN B 12 PO) Take 800 mg by mouth daily.    [name] desloratadine (CLARINEX) 5 MG tablet Take 5 mg by mouth at bedtime.     [name] FLOVENT HFA  110 MCG/ACT inhaler Inhale 2 puffs into the lungs 2 (two) times daily. Reported on 03/22/2015  5  . fluticasone (FLONASE) 50 MCG/ACT nasal spray Place 1 spray into both nostrils 2 (two) times daily.     . folic acid (FOLVITE) A999333 MCG tablet Take 400 mcg by mouth daily.    [name] gabapentin (NEURONTIN) 100 MG capsule Take 2 caps in AM, 1 cap in evening, 3 caps at bedtime 150 capsule 11  . LAC-HYDRIN FIVE 5 % LOTN lotion APPLY TO AFFECTED AREA(S) TWICE DAILY 120 mL 2  . omeprazole (PRILOSEC) 20 MG capsule TAKE ONE CAPSULE BY MOUTH DAILY AS NEEDED FOR ACID REFLUX/HEARTBURN 90 capsule 2  . polyethylene glycol powder (GLYCOLAX/MIRALAX) powder Take 17 g by mouth daily. 255 g 0  . Probiotic Product (ALIGN) 4 MG CAPS Take 4 mg by mouth daily.     . simvastatin (ZOCOR) 40 MG tablet Take 1 tablet (40 mg total) by mouth at bedtime. 30 tablet 11  . valsartan-hydrochlorothiazide (DIOVAN-HCT) 320-25 MG tablet Take 1 tablet by mouth daily. 30 tablet 5  . acetaminophen (TYLENOL) 500 MG tablet Take 1,000 mg by mouth every 6 (six) hours as needed for headache (pain). Reported on 05/30/2015    . albuterol (PROAIR HFA) 108 (90 BASE) MCG/ACT inhaler Inhale 1-2 puffs into the lungs every 6 (six)  hours as needed for wheezing or shortness of breath. (Patient not taking: Reported on 05/30/2015) 18 g 3  . diazepam (VALIUM) 5 MG tablet Take 1 tablet (5 mg total) by mouth at bedtime as needed for anxiety (sleep). (Patient not taking: Reported on 05/30/2015) 30 tablet 2  . ondansetron (ZOFRAN) 8 MG tablet Reported on 05/30/2015  3  . traMADol (ULTRAM) 50 MG tablet 50 mg. Reported on 05/30/2015  1   No current facility-administered medications for this visit.    Objective: BP 136/70 mmHg  Pulse 82  Temp(Src) 98.7 F (37.1 C)  Wt 208 lb (94.348 kg) Gen: NAD, resting comfortably CV: RRR no murmurs rubs or gallops Lungs: CTAB no crackles, wheeze, rhonchi Abdomen: soft/nontender/nondistended/normal bowel sounds.  Ext: no edema Skin:  warm, dry, no rash Neuro: grossly normal, moves all extremities  Assessment/Plan:  Hyperlipidemia S: mild poor control on Simvastatin 20mg  -->A/P: trial 40mg  if can tolerate (has had muscle weakness in past on other meds) to get LDL <70. Recheck LDL at follow up  Essential hypertension S: controlled on valsartan-hctz 320-25 BP Readings from Last 3 Encounters:  05/30/15 136/70  05/16/15 130/72  04/24/15 114/70  A/P:Continue current meds- doing well   Coronary atherosclerosis S: Patient has remained chest pain free after recovering from flu/cough- likely was MSK strain. Saw cards and no workup advised A/P: continue aspirin, bump up statin if able   Return in about 4 months (around 09/29/2015). Return precautions advised.   Meds ordered this encounter  Medications  . simvastatin (ZOCOR) 40 MG tablet    Sig: Take 1 tablet (40 mg total) by mouth at bedtime.    Dispense:  30 tablet    Refill:  11

## 2015-06-06 DIAGNOSIS — Z8546 Personal history of malignant neoplasm of prostate: Secondary | ICD-10-CM | POA: Diagnosis not present

## 2015-06-10 DIAGNOSIS — J3081 Allergic rhinitis due to animal (cat) (dog) hair and dander: Secondary | ICD-10-CM | POA: Diagnosis not present

## 2015-06-10 DIAGNOSIS — J3089 Other allergic rhinitis: Secondary | ICD-10-CM | POA: Diagnosis not present

## 2015-06-10 DIAGNOSIS — J301 Allergic rhinitis due to pollen: Secondary | ICD-10-CM | POA: Diagnosis not present

## 2015-06-13 DIAGNOSIS — N393 Stress incontinence (female) (male): Secondary | ICD-10-CM | POA: Diagnosis not present

## 2015-06-13 DIAGNOSIS — N481 Balanitis: Secondary | ICD-10-CM | POA: Diagnosis not present

## 2015-06-13 DIAGNOSIS — B356 Tinea cruris: Secondary | ICD-10-CM | POA: Diagnosis not present

## 2015-06-13 DIAGNOSIS — Z0000 Encounter for general adult medical examination without abnormal findings: Secondary | ICD-10-CM | POA: Diagnosis not present

## 2015-06-13 DIAGNOSIS — N4889 Other specified disorders of penis: Secondary | ICD-10-CM | POA: Diagnosis not present

## 2015-06-13 DIAGNOSIS — Z8546 Personal history of malignant neoplasm of prostate: Secondary | ICD-10-CM | POA: Diagnosis not present

## 2015-06-14 DIAGNOSIS — H25011 Cortical age-related cataract, right eye: Secondary | ICD-10-CM | POA: Diagnosis not present

## 2015-06-14 DIAGNOSIS — H2513 Age-related nuclear cataract, bilateral: Secondary | ICD-10-CM | POA: Diagnosis not present

## 2015-06-24 DIAGNOSIS — J3089 Other allergic rhinitis: Secondary | ICD-10-CM | POA: Diagnosis not present

## 2015-06-24 DIAGNOSIS — J3081 Allergic rhinitis due to animal (cat) (dog) hair and dander: Secondary | ICD-10-CM | POA: Diagnosis not present

## 2015-06-24 DIAGNOSIS — J301 Allergic rhinitis due to pollen: Secondary | ICD-10-CM | POA: Diagnosis not present

## 2015-06-25 ENCOUNTER — Telehealth: Payer: Self-pay | Admitting: Family Medicine

## 2015-06-25 NOTE — Telephone Encounter (Signed)
Schedule pt an OV to have ear cleaned out.

## 2015-06-25 NOTE — Telephone Encounter (Signed)
Pt would like to know if Dr Yong Channel could clean his R ear out Audiologist states that it has a lot of dry wax.  May call pt at [phone] up til 1:00pm today or times on home phone.

## 2015-06-26 NOTE — Telephone Encounter (Signed)
Pt has appt on April 11

## 2015-07-02 ENCOUNTER — Encounter: Payer: Self-pay | Admitting: Family Medicine

## 2015-07-02 ENCOUNTER — Ambulatory Visit (INDEPENDENT_AMBULATORY_CARE_PROVIDER_SITE_OTHER): Payer: Medicare Other | Admitting: Family Medicine

## 2015-07-02 VITALS — BP 138/82 | Temp 98.7°F | Wt 205.0 lb

## 2015-07-02 DIAGNOSIS — H9191 Unspecified hearing loss, right ear: Secondary | ICD-10-CM | POA: Diagnosis not present

## 2015-07-02 DIAGNOSIS — I2510 Atherosclerotic heart disease of native coronary artery without angina pectoris: Secondary | ICD-10-CM | POA: Diagnosis not present

## 2015-07-02 DIAGNOSIS — L600 Ingrowing nail: Secondary | ICD-10-CM | POA: Diagnosis not present

## 2015-07-02 DIAGNOSIS — H938X1 Other specified disorders of right ear: Secondary | ICD-10-CM | POA: Diagnosis not present

## 2015-07-02 NOTE — Patient Instructions (Addendum)
We will call you within a week about your referral to podiatry. If you do not hear within 2 weeks, give [place] a call.   Right ear cleaned out. Left ear- removed possible filter from hearing aid.   Great job increasing your exercise and losing 3 lbs. Keep up the good work.

## 2015-07-02 NOTE — Progress Notes (Signed)
[name], MD  Subjective:  Joshua [name] is a 75 y.o. year old very pleasant male patient who presents for/with See problem oriented charting ROS- No chest pain or shortness of breath. No headache or blurry vision. Does have some hearing loss.   Past Medical History-  Patient Active Problem List   Diagnosis Date Noted  . Coronary atherosclerosis 10/12/2006    Priority: High  . Irritable bowel syndrome 10/12/2006    Priority: High  . CKD (chronic kidney disease), stage III 10/23/2013    Priority: Medium  . Neuropathy ([place]) 08/21/2013    Priority: Medium  . 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 Essential hypertension 02/21/2007    Priority: Medium  . Hyperlipidemia 10/12/2006    Priority: Medium  . DEPRESSION 10/12/2006    Priority: Medium  . Asthma 10/12/2006    Priority: Medium  . Allergic rhinitis 05/31/2014    Priority: Low  . Fatigue 11/09/2013    Priority: Low  . Dizziness 08/23/2013    Priority: Low  . RLS (restless legs syndrome) 01/03/2013    Priority: Low  . Low back pain radiating to left leg 10/09/2011    Priority: Low  . OSTEOARTHRITIS, LOWER LEG, LEFT 10/04/2009    Priority: Low  . TOBACCO ABUSE, HX OF 12/26/2008    Priority: Low  . ANEMIA, IRON DEFICIENCY 11/06/2008    Priority: Low  . 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 OSA (obstructive sleep apnea) 11/02/2006    Priority: Low  . GERD 10/12/2006    Priority: Low  . Musculoskeletal chest pain 04/24/2015  . Hereditary and idiopathic peripheral neuropathy 09/25/2014    Medications- reviewed and updated Current Outpatient Prescriptions  Medication Sig Dispense Refill  . Alpha-Lipoic Acid 300 MG CAPS Take 1 capsule by mouth 2 (two) times daily.    [name] aspirin EC 81 MG tablet Take 81 mg by mouth daily.    . Cyanocobalamin (VITAMIN B 12 PO) Take 800 mg by mouth daily.    [name] desloratadine (CLARINEX) 5 MG tablet Take 5 mg by mouth at  bedtime.     [name] FLOVENT HFA 110 MCG/ACT inhaler Inhale 2 puffs into the lungs 2 (two) times daily. Reported on 03/22/2015  5  . fluticasone (FLONASE) 50 MCG/ACT nasal spray Place 1 spray into [name] nostrils 2 (two) times daily.     . folic acid (FOLVITE) A999333 MCG tablet Take 400 mcg by mouth daily.    [name] gabapentin (NEURONTIN) 100 MG capsule Take 2 caps in AM, 1 cap in evening, 3 caps at bedtime 150 capsule 11  . LAC-HYDRIN FIVE 5 % LOTN lotion APPLY TO AFFECTED AREA(S) TWICE DAILY 120 mL 2  . omeprazole (PRILOSEC) 20 MG capsule TAKE ONE CAPSULE BY MOUTH DAILY AS NEEDED FOR ACID REFLUX/HEARTBURN 90 capsule 2  . polyethylene glycol powder (GLYCOLAX/MIRALAX) powder Take 17 g by mouth daily. 255 g 0  . Probiotic Product (ALIGN) 4 MG CAPS Take 4 mg by mouth daily.     . simvastatin (ZOCOR) 40 MG tablet Take 1 tablet (40 mg total) by mouth at bedtime. 30 tablet 11  . valsartan-hydrochlorothiazide (DIOVAN-HCT) 320-25 MG tablet Take 1 tablet by mouth daily. 30 tablet 5  . acetaminophen (TYLENOL) 500 MG tablet Take 1,000 mg by mouth every 6 (six) hours as needed for headache (pain). Reported on 07/02/2015    . albuterol (PROAIR HFA) 108 (90 BASE) MCG/ACT inhaler Inhale 1-2 puffs into  the lungs every 6 (six) hours as needed for wheezing or shortness of breath. (Patient not taking: Reported on 05/30/2015) 18 g 3  . diazepam (VALIUM) 5 MG tablet Take 1 tablet (5 mg total) by mouth at bedtime as needed for anxiety (sleep). (Patient not taking: Reported on 07/02/2015) 30 tablet 2  . ondansetron (ZOFRAN) 8 MG tablet Reported on 07/02/2015  3  . traMADol (ULTRAM) 50 MG tablet 50 mg. Reported on 07/02/2015  1   No current facility-administered medications for this visit.    Objective: BP 138/82 mmHg  Temp(Src) 98.7 F (37.1 C)  Wt 205 lb (92.987 kg) Gen: NAD, resting comfortably TM obstructed on right. On left there is small white object. After irrigation by Clyde Lundborg- TM normal on right. Was able to curette out  small white object (hearing aid filter) from the left) CV: RRR no murmurs rubs or gallops Lungs: CTAB no crackles, wheeze, rhonchi Abdomen: soft/nontender/nondistended/normal bowel sounds. obese Ext: no edema Skin: warm, dry, no rash Neuro: grossly normal, moves all extremities  Assessment/Plan:   Hearing loss and cerumen impaction S: several days of ear fullness and trouble hearing out of right ear despite hearing aid A/P: irrigation performed. Issue resolved  Obesity S: worries about weight gain when takes an extra gabapentin (helps with neuropathy).  A/P: weight down 3 lbs but states he pulled back a pill. We completed several minutes of counseling. He has certain foods he just avoids and is not willing to trial so we focused on increasing exercise.   Ingrown toenail S: history of ingrown toenail on right foot- treated twice in past but grew back out. Sees podiatry currently but wants another opinion A/P: wants to transfer care to [facility]- referral placed  Return precautions advised.   Orders Placed This Encounter  Procedures  . Ambulatory referral to Podiatry    Referral Priority:  Routine    Referral Type:  Consultation    Referral Reason:  Specialty Services Required    Requested Specialty:  Podiatry    Number of Visits Requested:  1   The duration of face-to-face time during this visit was 20 minutes. Greater than 50% of this time was spent in counseling, explanation of diagnosis, planning of further management, and/or coordination of care.

## 2015-07-08 DIAGNOSIS — J3081 Allergic rhinitis due to animal (cat) (dog) hair and dander: Secondary | ICD-10-CM | POA: Diagnosis not present

## 2015-07-08 DIAGNOSIS — J3089 Other allergic rhinitis: Secondary | ICD-10-CM | POA: Diagnosis not present

## 2015-07-08 DIAGNOSIS — J301 Allergic rhinitis due to pollen: Secondary | ICD-10-CM | POA: Diagnosis not present

## 2015-07-22 DIAGNOSIS — J301 Allergic rhinitis due to pollen: Secondary | ICD-10-CM | POA: Diagnosis not present

## 2015-07-22 DIAGNOSIS — J3081 Allergic rhinitis due to animal (cat) (dog) hair and dander: Secondary | ICD-10-CM | POA: Diagnosis not present

## 2015-07-22 DIAGNOSIS — J3089 Other allergic rhinitis: Secondary | ICD-10-CM | POA: Diagnosis not present

## 2015-08-05 DIAGNOSIS — J3081 Allergic rhinitis due to animal (cat) (dog) hair and dander: Secondary | ICD-10-CM | POA: Diagnosis not present

## 2015-08-05 DIAGNOSIS — J301 Allergic rhinitis due to pollen: Secondary | ICD-10-CM | POA: Diagnosis not present

## 2015-08-05 DIAGNOSIS — J3089 Other allergic rhinitis: Secondary | ICD-10-CM | POA: Diagnosis not present

## 2015-08-06 ENCOUNTER — Other Ambulatory Visit: Payer: Self-pay | Admitting: Internal Medicine

## 2015-08-06 DIAGNOSIS — I6523 Occlusion and stenosis of bilateral carotid arteries: Secondary | ICD-10-CM

## 2015-08-15 ENCOUNTER — Ambulatory Visit (HOSPITAL_COMMUNITY)
Admission: RE | Admit: 2015-08-15 | Discharge: 2015-08-15 | Disposition: A | Discharge status: Home / Self Care | Payer: Medicare Other | Source: Ambulatory Visit | Attending: Cardiology | Admitting: Cardiology

## 2015-08-15 DIAGNOSIS — I10 Essential (primary) hypertension: Secondary | ICD-10-CM | POA: Insufficient documentation

## 2015-08-15 DIAGNOSIS — G4733 Obstructive sleep apnea (adult) (pediatric): Secondary | ICD-10-CM | POA: Insufficient documentation

## 2015-08-15 DIAGNOSIS — I6523 Occlusion and stenosis of bilateral carotid arteries: Secondary | ICD-10-CM

## 2015-08-15 DIAGNOSIS — K219 Gastro-esophageal reflux disease without esophagitis: Secondary | ICD-10-CM | POA: Insufficient documentation

## 2015-08-15 DIAGNOSIS — I2510 Atherosclerotic heart disease of native coronary artery without angina pectoris: Secondary | ICD-10-CM | POA: Diagnosis not present

## 2015-08-15 DIAGNOSIS — E785 Hyperlipidemia, unspecified: Secondary | ICD-10-CM | POA: Diagnosis not present

## 2015-08-16 DIAGNOSIS — J3081 Allergic rhinitis due to animal (cat) (dog) hair and dander: Secondary | ICD-10-CM | POA: Diagnosis not present

## 2015-08-16 DIAGNOSIS — J3089 Other allergic rhinitis: Secondary | ICD-10-CM | POA: Diagnosis not present

## 2015-08-16 DIAGNOSIS — J301 Allergic rhinitis due to pollen: Secondary | ICD-10-CM | POA: Diagnosis not present

## 2015-08-26 DIAGNOSIS — J301 Allergic rhinitis due to pollen: Secondary | ICD-10-CM | POA: Diagnosis not present

## 2015-08-26 DIAGNOSIS — J3089 Other allergic rhinitis: Secondary | ICD-10-CM | POA: Diagnosis not present

## 2015-08-26 DIAGNOSIS — J3081 Allergic rhinitis due to animal (cat) (dog) hair and dander: Secondary | ICD-10-CM | POA: Diagnosis not present

## 2015-09-03 ENCOUNTER — Ambulatory Visit: Payer: Medicare Other | Admitting: Pulmonary Disease

## 2015-09-05 ENCOUNTER — Encounter: Payer: Self-pay | Admitting: Neurology

## 2015-09-09 DIAGNOSIS — J3089 Other allergic rhinitis: Secondary | ICD-10-CM | POA: Diagnosis not present

## 2015-09-09 DIAGNOSIS — J3081 Allergic rhinitis due to animal (cat) (dog) hair and dander: Secondary | ICD-10-CM | POA: Diagnosis not present

## 2015-09-09 DIAGNOSIS — J301 Allergic rhinitis due to pollen: Secondary | ICD-10-CM | POA: Diagnosis not present

## 2015-09-12 ENCOUNTER — Encounter: Payer: Self-pay | Admitting: Pulmonary Disease

## 2015-09-12 ENCOUNTER — Ambulatory Visit (INDEPENDENT_AMBULATORY_CARE_PROVIDER_SITE_OTHER): Payer: Medicare Other | Admitting: Pulmonary Disease

## 2015-09-12 VITALS — BP 102/68 | HR 83 | Ht 66.25 in | Wt 206.8 lb

## 2015-09-12 DIAGNOSIS — G4733 Obstructive sleep apnea (adult) (pediatric): Secondary | ICD-10-CM

## 2015-09-12 DIAGNOSIS — I6523 Occlusion and stenosis of bilateral carotid arteries: Secondary | ICD-10-CM | POA: Diagnosis not present

## 2015-09-12 NOTE — Patient Instructions (Signed)
Will arrange for referral to Dr. Oneal Grout to assess for oral appliance to treat obstructive sleep apnea  Can look up following web sites for CPAP mask options: [employer], [employer], [employer], [employer]  Follow up in 6 months

## 2015-09-12 NOTE — Progress Notes (Signed)
Current Outpatient Prescriptions on File Prior to Visit  Medication Sig  . acetaminophen (TYLENOL) 500 MG tablet Take 1,000 mg by mouth every 6 (six) hours as needed for headache (pain). Reported on 07/02/2015  . albuterol (PROAIR HFA) 108 (90 BASE) MCG/ACT inhaler Inhale 1-2 puffs into the lungs every 6 (six) hours as needed for wheezing or shortness of breath.  . Alpha-Lipoic Acid 300 MG CAPS Take 1 capsule by mouth 2 (two) times daily.  [name] aspirin EC 81 MG tablet Take 81 mg by mouth daily.  . Cyanocobalamin (VITAMIN B 12 PO) Take 800 mg by mouth daily.  [name] desloratadine (CLARINEX) 5 MG tablet Take 5 mg by mouth at bedtime.   . diazepam (VALIUM) 5 MG tablet Take 1 tablet (5 mg total) by mouth at bedtime as needed for anxiety (sleep).  [name] FLOVENT HFA 110 MCG/ACT inhaler Inhale 2 puffs into the lungs 2 (two) times daily. Reported on 03/22/2015  . fluticasone (FLONASE) 50 MCG/ACT nasal spray Place 1 spray into both nostrils 2 (two) times daily.   . folic acid (FOLVITE) A999333 MCG tablet Take 400 mcg by mouth daily.  [name] gabapentin (NEURONTIN) 100 MG capsule Take 2 caps in AM, 1 cap in evening, 3 caps at bedtime  . LAC-HYDRIN FIVE 5 % LOTN lotion APPLY TO AFFECTED AREA(S) TWICE DAILY  . omeprazole (PRILOSEC) 20 MG capsule TAKE ONE CAPSULE BY MOUTH DAILY AS NEEDED FOR ACID REFLUX/HEARTBURN  . ondansetron (ZOFRAN) 8 MG tablet Reported on 07/02/2015  . polyethylene glycol powder (GLYCOLAX/MIRALAX) powder Take 17 g by mouth daily.  . Probiotic Product (ALIGN) 4 MG CAPS Take 4 mg by mouth daily.   . simvastatin (ZOCOR) 40 MG tablet Take 1 tablet (40 mg total) by mouth at bedtime.  . traMADol (ULTRAM) 50 MG tablet 50 mg. Reported on 07/02/2015  . valsartan-hydrochlorothiazide (DIOVAN-HCT) 320-25 MG tablet Take 1 tablet by mouth daily.   No current facility-administered medications on file prior to visit.     Chief Complaint  Patient presents with  . Follow-up    Former [facility] patient: Wears CPAP nightly.  Denies problems pressure setting. Pt having trouble tolerating maks.  DME: [employer].     Tests Echo 05/10/12 >> EF 60 to 65%, mild LVH PSG 12/25/13 >> AHI 13 Auto CPAP 08/12/15 to 09/10/15 >> used on 29 of 30 nights with average 6 hrs 31 min.  Average AHI 0.9 with median CPAP 7 and 95 th percentile CPAP 10 cm H2O  Past medical hx Asthma, CAD s/p CABG, HTN, HLD, Depression, IBS, Prostate cancer 2012, Bladder cancer 2012, Pneumonia, Gout  Past surgical hx, Allergies, Family hx, Social hx all reviewed.  Vital Signs BP 102/68 mmHg  Pulse 83  Ht 5' 6.25" (1.683 m)  Wt 206 lb 12.8 oz (93.804 kg)  BMI 33.12 kg/m2  SpO2 97%  History of Present Illness Joshua [name] is a 75 y.o. male with obstructive sleep apnea.  He uses CPAP every night.  He can only use for about 5 to 6 hours.  He gets irritation from mask and has to take it off.  He has tried several masks, and currently using full face mask.  He Joshua take a nap for about 1 hour every day >> not using CPAP then.  He wears a retainer from his dentist.  He was told by his dentist that he could use oral appliance, but his dentist didn't work with medical insurance.  Physical Exam  General - No distress ENT -  No sinus tenderness, no oral exudate, no LAN, MP 3, scalloped tongue Cardiac - s1s2 regular, no murmur Chest - No wheeze/rales/dullness Back - No focal tenderness Abd - Soft, non-tender Ext - No edema Neuro - Normal strength Skin - No rashes Psych - normal mood, and behavior   Assessment/Plan  Obstructive sleep apnea. - continue auto CPAP for now - Joshua have him look up CPAP mask options and call if he finds one he would like to try - Joshua refer to Dr. Oneal Grout to assess for oral appliance - explained he would need f/u sleep study if he gets oral appliance >> could be home sleep study    Patient Instructions  Joshua arrange for referral to Dr. Oneal Grout to assess for oral appliance to treat obstructive sleep apnea  Can  look up following web sites for CPAP mask options: [employer], [employer]ics, [employer], [employer]  Follow up in 6 months     Chesley Mires, MD [facility] Pager:  [phone] 09/12/2015, 2:28 PM

## 2015-09-14 ENCOUNTER — Encounter: Payer: Self-pay | Admitting: Neurology

## 2015-09-25 DIAGNOSIS — J3081 Allergic rhinitis due to animal (cat) (dog) hair and dander: Secondary | ICD-10-CM | POA: Diagnosis not present

## 2015-09-25 DIAGNOSIS — J3089 Other allergic rhinitis: Secondary | ICD-10-CM | POA: Diagnosis not present

## 2015-09-25 DIAGNOSIS — J301 Allergic rhinitis due to pollen: Secondary | ICD-10-CM | POA: Diagnosis not present

## 2015-10-03 ENCOUNTER — Ambulatory Visit (INDEPENDENT_AMBULATORY_CARE_PROVIDER_SITE_OTHER): Payer: Medicare Other | Admitting: Family Medicine

## 2015-10-03 ENCOUNTER — Encounter: Payer: Self-pay | Admitting: Family Medicine

## 2015-10-03 VITALS — BP 100/66 | HR 56 | Temp 98.0°F | Ht 66.25 in | Wt 205.2 lb

## 2015-10-03 DIAGNOSIS — I6523 Occlusion and stenosis of bilateral carotid arteries: Secondary | ICD-10-CM | POA: Diagnosis not present

## 2015-10-03 DIAGNOSIS — E669 Obesity, unspecified: Secondary | ICD-10-CM

## 2015-10-03 DIAGNOSIS — G629 Polyneuropathy, unspecified: Secondary | ICD-10-CM | POA: Diagnosis not present

## 2015-10-03 DIAGNOSIS — E785 Hyperlipidemia, unspecified: Secondary | ICD-10-CM | POA: Diagnosis not present

## 2015-10-03 DIAGNOSIS — I10 Essential (primary) hypertension: Secondary | ICD-10-CM

## 2015-10-03 MED ORDER — TETANUS-DIPHTH-ACELL PERTUSSIS 5-2.5-18.5 LF-MCG/0.5 IM SUSP: 0.5000 mL | Freq: Once | INTRAMUSCULAR | Status: DC

## 2015-10-03 NOTE — Progress Notes (Signed)
Subjective:  Joshua [name] is a 75 y.o. year old very pleasant male patient who presents for/with See problem oriented charting ROS- No chest pain or shortness of breath. No headache or blurry vision continues to have chronic mild fatigue.see any ROS included in HPI as well.   Past Medical History-  Patient Active Problem List   Diagnosis Date Noted  . Coronary atherosclerosis 10/12/2006    Priority: High  . Irritable bowel syndrome 10/12/2006    Priority: High  . CKD (chronic kidney disease), stage III 10/23/2013    Priority: Medium  . Neuropathy ([place]) 08/21/2013    Priority: Medium  . 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 Essential hypertension 02/21/2007    Priority: Medium  . Hyperlipidemia 10/12/2006    Priority: Medium  . DEPRESSION 10/12/2006    Priority: Medium  . Asthma 10/12/2006    Priority: Medium  . Allergic rhinitis 05/31/2014    Priority: Low  . Fatigue 11/09/2013    Priority: Low  . Dizziness 08/23/2013    Priority: Low  . RLS (restless legs syndrome) 01/03/2013    Priority: Low  . Low back pain radiating to left leg 10/09/2011    Priority: Low  . OSTEOARTHRITIS, LOWER LEG, LEFT 10/04/2009    Priority: Low  . TOBACCO ABUSE, HX OF 12/26/2008    Priority: Low  . ANEMIA, IRON DEFICIENCY 11/06/2008    Priority: Low  . 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 OSA (obstructive sleep apnea) 11/02/2006    Priority: Low  . GERD 10/12/2006    Priority: Low  . Obesity, Class I, BMI 30-34.9 10/03/2015  . Musculoskeletal chest pain 04/24/2015  . Hereditary and idiopathic peripheral neuropathy 09/25/2014    Medications- reviewed and updated Current Outpatient Prescriptions  Medication Sig Dispense Refill  . acetaminophen (TYLENOL) 500 MG tablet Take 1,000 mg by mouth every 6 (six) hours as needed for headache (pain). Reported on 07/02/2015    . albuterol (PROAIR HFA) 108 (90 BASE) MCG/ACT inhaler  Inhale 1-2 puffs into the lungs every 6 (six) hours as needed for wheezing or shortness of breath. 18 g 3  . Alpha-Lipoic Acid 300 MG CAPS Take 1 capsule by mouth 2 (two) times daily.    [name] aspirin EC 81 MG tablet Take 81 mg by mouth daily.    . Cyanocobalamin (VITAMIN B 12 PO) Take 800 mg by mouth daily.    [name] desloratadine (CLARINEX) 5 MG tablet Take 5 mg by mouth at bedtime.     . diazepam (VALIUM) 5 MG tablet Take 1 tablet (5 mg total) by mouth at bedtime as needed for anxiety (sleep). 30 tablet 2  . FLOVENT HFA 110 MCG/ACT inhaler Inhale 2 puffs into the lungs 2 (two) times daily. Reported on 03/22/2015  5  . fluticasone (FLONASE) 50 MCG/ACT nasal spray Place 1 spray into both nostrils 2 (two) times daily.     . folic acid (FOLVITE) A999333 MCG tablet Take 400 mcg by mouth daily.    [name] gabapentin (NEURONTIN) 100 MG capsule Take 2 caps in AM, 1 cap in evening, 3 caps at bedtime 150 capsule 11  . ketoconazole (NIZORAL) 2 % cream Apply 1 application topically daily.    [name] LAC-HYDRIN FIVE 5 % LOTN lotion APPLY TO AFFECTED AREA(S) TWICE DAILY 120 mL 2  . Naftifine HCl (NAFTIN) 2 % GEL Apply topically 2 (two) times daily.    [name] nystatin (MYCOSTATIN/NYSTOP) powder Apply  topically 4 (four) times daily.    [name] nystatin ointment (MYCOSTATIN) Apply 1 application topically 2 (two) times daily.    [name] omeprazole (PRILOSEC) 20 MG capsule TAKE ONE CAPSULE BY MOUTH DAILY AS NEEDED FOR ACID REFLUX/HEARTBURN 90 capsule 2  . ondansetron (ZOFRAN) 8 MG tablet Reported on 07/02/2015  3  . polyethylene glycol powder (GLYCOLAX/MIRALAX) powder Take 17 g by mouth daily. 255 g 0  . Probiotic Product (ALIGN) 4 MG CAPS Take 4 mg by mouth daily.     . simvastatin (ZOCOR) 40 MG tablet Take 1 tablet (40 mg total) by mouth at bedtime. 30 tablet 11  . traMADol (ULTRAM) 50 MG tablet 50 mg. Reported on 07/02/2015  1  . valsartan-hydrochlorothiazide (DIOVAN-HCT) 320-25 MG tablet Take 1 tablet by mouth daily. 30 tablet 5  . Tdap (BOOSTRIX)  5-2.5-18.5 LF-MCG/0.5 injection Inject 0.5 mLs into the muscle once. 0.5 mL 0   No current facility-administered medications for this visit.    Objective: BP 100/66 mmHg  Pulse 56  Temp(Src) 98 F (36.7 C) (Oral)  Ht 5' 6.25" (1.683 m)  Wt 205 lb 3.2 oz (93.078 kg)  BMI 32.86 kg/m2  SpO2 98% Gen: NAD, resting comfortably CV: RRR no murmurs rubs or gallops Lungs: CTAB no crackles, wheeze, rhonchi Abdomen: soft/nontender/nondistended/normal bowel sounds. No rebound or guarding.  Ext: no edema Skin: warm, dry Neuro: grossly normal, moves all extremities  Assessment/Plan:  Essential hypertension S: controlled on valsartan 320-25mg . Denies orthostatic symptoms BP Readings from Last 3 Encounters:  10/03/15 100/66  09/12/15 102/68  07/02/15 138/82  A/P:Continue current meds:  Last 2 BPs on lower end- may need to reduce dose if consistent trend but prior to last 2 had several closer to goal limit so will not adjust for now  Hyperlipidemia S: mild poorly controlled on simvastatin 20mg  previously, now up to 40mg  and tolerating. No myalgias. Lab Results  Component Value Date   CHOL 168 11/19/2014   HDL 40.70 11/19/2014   LDLCALC 95 11/19/2014   LDLDIRECT 81.1 07/04/2009   TRIG 158.0* 11/19/2014   CHOLHDL 4 11/19/2014   A/P: update lipids next visit  Neuropathy ([place]) S:Gabapentin 500 very helpful to his neuropathic pain but had weight gain. gabapentin 200 now on lipoic acid and doing ok but wonders about other options. Syncopal episode on nortriptyline, did not feel well on lyrica A/P: discussed options limited- with SE profile- current dose seems to be most effective for patient- will continue   Obesity, Class I, BMI 30-34.9 S: patient with difficulty finding foods that are good for him that he enjoys eating- reduced sense of smell. Remains overweight- thinsk gabapentin contributed A/P: counsele dpatient but also ultimately referred to nutrition/dieticiain today    Return  in about 4 months (around 02/03/2016).  Orders Placed This Encounter  Procedures  . Amb ref to Medical Nutrition Therapy-MNT    Referral Priority:  Routine    Referral Type:  Consultation    Referral Reason:  Specialty Services Required    Requested Specialty:  Nutrition    Number of Visits Requested:  1    Meds ordered this encounter  Medications  . nystatin (MYCOSTATIN/NYSTOP) powder    Sig: Apply topically 4 (four) times daily.  [name] nystatin ointment (MYCOSTATIN)    Sig: Apply 1 application topically 2 (two) times daily.  [name] ketoconazole (NIZORAL) 2 % cream    Sig: Apply 1 application topically daily.  . Tdap (BOOSTRIX) 5-2.5-18.5 LF-MCG/0.5 injection    Sig: Inject 0.5 mLs  into the muscle once.    Dispense:  0.5 mL    Refill:  0   The duration of face-to-face time during this visit was 25 minutes. Greater than 50% of this time was spent in counseling, explanation of diagnosis, planning of further management, and/or coordination of care.    Return precautions advised.  [name], MD

## 2015-10-03 NOTE — Assessment & Plan Note (Signed)
S: controlled on valsartan 320-25mg . Denies orthostatic symptoms BP Readings from Last 3 Encounters:  10/03/15 100/66  09/12/15 102/68  07/02/15 138/82  A/P:Continue current meds:  Last 2 BPs on lower end- may need to reduce dose if consistent trend but prior to last 2 had several closer to goal limit so will not adjust for now

## 2015-10-03 NOTE — Assessment & Plan Note (Signed)
S: mild poorly controlled on simvastatin 20mg  previously, now up to 40mg  and tolerating. No myalgias. Lab Results  Component Value Date   CHOL 168 11/19/2014   HDL 40.70 11/19/2014   LDLCALC 95 11/19/2014   LDLDIRECT 81.1 07/04/2009   TRIG 158.0* 11/19/2014   CHOLHDL 4 11/19/2014   A/P: update lipids next visit

## 2015-10-03 NOTE — Assessment & Plan Note (Signed)
S:Gabapentin 500 very helpful to his neuropathic pain but had weight gain. gabapentin 200 now on lipoic acid and doing ok but wonders about other options. Syncopal episode on nortriptyline, did not feel well on lyrica A/P: discussed options limited- with SE profile- current dose seems to be most effective for patient- will continue

## 2015-10-03 NOTE — Assessment & Plan Note (Signed)
S: patient with difficulty finding foods that are good for him that he enjoys eating- reduced sense of smell. Remains overweight- thinsk gabapentin contributed A/P: counsele dpatient but also ultimately referred to nutrition/dieticiain today

## 2015-10-03 NOTE — Progress Notes (Signed)
Pre visit review using our clinic review tool, if applicable. No additional management support is needed unless otherwise documented below in the visit note. 

## 2015-10-03 NOTE — Patient Instructions (Addendum)
Refer to nutrition/dietician today- We will call you within a week about your referral. If you do not hear within 2 weeks, give [place] a call.   No changes in medication  Update labs probably next visit  Get tetanus shot if affordable

## 2015-10-07 ENCOUNTER — Telehealth: Payer: Self-pay | Admitting: Family Medicine

## 2015-10-07 DIAGNOSIS — J301 Allergic rhinitis due to pollen: Secondary | ICD-10-CM | POA: Diagnosis not present

## 2015-10-07 DIAGNOSIS — J3081 Allergic rhinitis due to animal (cat) (dog) hair and dander: Secondary | ICD-10-CM | POA: Diagnosis not present

## 2015-10-07 DIAGNOSIS — J3089 Other allergic rhinitis: Secondary | ICD-10-CM | POA: Diagnosis not present

## 2015-10-07 NOTE — Telephone Encounter (Signed)
Called patient and left a voicemail for a return phone call 

## 2015-10-07 NOTE — Telephone Encounter (Signed)
Pt would like to see if you could refer him to a different urologist.  Pt states he is in a lot of pain and would like to have someone that is kind and compassionate to his situation.

## 2015-10-09 NOTE — Telephone Encounter (Signed)
You can refer him to [facility] under penile pain if he doesn't mind driving.   Can refer to dietician under obesity

## 2015-10-11 ENCOUNTER — Other Ambulatory Visit: Payer: Self-pay

## 2015-10-11 DIAGNOSIS — E669 Obesity, unspecified: Secondary | ICD-10-CM

## 2015-10-11 NOTE — Telephone Encounter (Signed)
Spoke with patient and notified him that an order had been out in for the dietician. Patient verbalized understanding. Offered the patient a referral to [facility]. Patient wants to try to get in with another provider at [facility]. If he is unable to so so he will call back for referral to [facility]

## 2015-10-19 ENCOUNTER — Other Ambulatory Visit: Payer: Self-pay | Admitting: Family Medicine

## 2015-10-21 DIAGNOSIS — J301 Allergic rhinitis due to pollen: Secondary | ICD-10-CM | POA: Diagnosis not present

## 2015-10-21 DIAGNOSIS — J3089 Other allergic rhinitis: Secondary | ICD-10-CM | POA: Diagnosis not present

## 2015-10-21 DIAGNOSIS — J3081 Allergic rhinitis due to animal (cat) (dog) hair and dander: Secondary | ICD-10-CM | POA: Diagnosis not present

## 2015-10-22 NOTE — Progress Notes (Signed)
HPI  The patient returns for follow up .  Since I last saw him he came back to see Tarri Fuller PA and had chest pain that was felt to be atypical.  Since then he's done well. He did have a little atypical pain once while on an exercise machine but this has not been reproducible at all. He's able to do a lot of swimming. He climbs stairs delivering flowers. With this he denies any symptoms.  The patient denies any new symptoms such as chest discomfort, neck or arm discomfort. There has been no new shortness of breath, PND or orthopnea. There have been no reported palpitations, presyncope or syncope.  He does report that his sleep apnea is much improved as he stop using CPAP and started to use an appliance.  Allergies  Allergen Reactions  . Azithromycin Other (See Comments)    Dizziness and double vision   . 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 Bromfed Nausea And Vomiting  . Clarithromycin Other (See Comments)     gastritis  . Codeine Other (See Comments)    Trouble breathing  . Doxycycline Hyclate Nausea And Vomiting  . Modafinil Other (See Comments)     anxiety-nervousness  . Promethazine Hcl Other (See Comments)     fainting  . Quinolones     Cipro  . Telithromycin Nausea And Vomiting  . Erythromycin Base Rash  . Hydrocodone-Acetaminophen Rash  . Sulfonamide Derivatives Rash    Current Outpatient Prescriptions  Medication Sig Dispense Refill  . acetaminophen (TYLENOL) 500 MG tablet Take 1,000 mg by mouth every 6 (six) hours as needed for headache (pain). Reported on 07/02/2015    . albuterol (PROAIR HFA) 108 (90 BASE) MCG/ACT inhaler Inhale 1-2 puffs into the lungs every 6 (six) hours as needed for wheezing or shortness of breath. 18 g 3  . Alpha-Lipoic Acid 300 MG CAPS Take 1 capsule by mouth 2 (two) times daily.    [name] aspirin EC 81 MG tablet Take 81 mg by mouth daily.    . Cyanocobalamin (VITAMIN B 12 PO) Take  800 mg by mouth daily.    [name] desloratadine (CLARINEX) 5 MG tablet Take 5 mg by mouth at bedtime.     . diazepam (VALIUM) 5 MG tablet Take 1 tablet (5 mg total) by mouth at bedtime as needed for anxiety (sleep). 30 tablet 2  . FLOVENT HFA 110 MCG/ACT inhaler Inhale 2 puffs into the lungs 2 (two) times daily. Reported on 03/22/2015  5  . fluticasone (FLONASE) 50 MCG/ACT nasal spray Place 1 spray into both nostrils 2 (two) times daily.     . folic acid (FOLVITE) A999333 MCG tablet Take 400 mcg by mouth daily.    [name] gabapentin (NEURONTIN) 100 MG capsule Take 2 caps in AM, 1 cap in evening, 3 caps at bedtime 150 capsule 11  . ketoconazole (NIZORAL) 2 % cream Apply 1 application topically daily.    [name] LAC-HYDRIN FIVE 5 % LOTN lotion APPLY TO AFFECTED AREA(S) TWICE DAILY 120 mL 2  . Naftifine HCl (NAFTIN) 2 % GEL Apply topically 2 (two) times daily.    [name] nystatin (MYCOSTATIN/NYSTOP) powder Apply topically 4 (four) times daily.    [name] nystatin ointment (MYCOSTATIN) Apply 1 application topically 2 (two) times daily.    [name] omeprazole (PRILOSEC) 20 MG capsule TAKE ONE CAPSULE BY MOUTH DAILY AS NEEDED FOR ACID REFLUX/HEARTBURN 90 capsule 2  .  ondansetron (ZOFRAN) 8 MG tablet Reported on 07/02/2015  3  . polyethylene glycol powder (GLYCOLAX/MIRALAX) powder Take 17 g by mouth daily. 255 g 0  . Probiotic Product (ALIGN) 4 MG CAPS Take 4 mg by mouth daily.     . simvastatin (ZOCOR) 40 MG tablet Take 1 tablet (40 mg total) by mouth at bedtime. 30 tablet 11  . Tdap (BOOSTRIX) 5-2.5-18.5 LF-MCG/0.5 injection Inject 0.5 mLs into the muscle once. 0.5 mL 0  . valsartan-hydrochlorothiazide (DIOVAN-HCT) 320-25 MG tablet TAKE ONE TABLET BY MOUTH ONCE DAILY 30 tablet 5   No current facility-administered medications for this visit.     Past Medical History:  Diagnosis Date  . Acute medial meniscal tear   . Anemia   . Arthritis    "middle finger right hand; right knee; neck" (02/06/2014)  . Asthma   . Bladder cancer ([place])  04/2010   "cauterized during prostate OR"  . CAD (coronary artery disease)    CABG 2001  . Depression   . Diverticulosis   . DJD (degenerative joint disease)   . GERD (gastroesophageal reflux disease)   . History of blood transfusion 2001   "related to the bypass OR"  . History of gout   . Hyperlipidemia   . Hypertension   . IBS (irritable bowel syndrome)   . Obesity   . OSA (obstructive sleep apnea)   . Pancreatitis ~ 1980  . Pneumonia 1980's X 2  . Prostate cancer ([place]) 04/2010    Past Surgical History:  Procedure Laterality Date  . ANTERIOR CERVICAL DECOMP/DISCECTOMY FUSION  05/26/06  . BACK SURGERY    . CARDIAC CATHETERIZATION  11/20/1999  . CORONARY ARTERY BYPASS GRAFT  11/20/1999   SVG-RI1-RI2, SVG-OM, SVG-dRCA  . KNEE ARTHROSCOPY Right 1992; 04/2008  . LAPAROSCOPIC CHOLECYSTECTOMY  2007  . NASAL SEPTUM SURGERY Right 2007  . ROBOT ASSISTED LAPAROSCOPIC RADICAL PROSTATECTOMY  04/2010  . THYROIDECTOMY, PARTIAL  1980    ROS:  ED.  As stated in the HPI and negative for all other systems.   PHYSICAL EXAM BP 115/66   Pulse 78   Ht 5\' 6"  (1.676 m)   Wt 206 lb (93.4 kg)   BMI 33.25 kg/m  GENERAL:  Well appearing NECK:  No jugular venous distention, waveform within normal limits, carotid upstroke brisk and symmetric, no bruits, no thyromegaly LUNGS:  Clear to auscultation bilaterally CHEST:  Well healed sternotomy scar. HEART:  PMI not displaced or sustained,S1 and S2 within normal limits, no S3, no S4, no clicks, no rubs, no murmurs ABD:  Flat, positive bowel sounds normal in frequency in pitch, no bruits, no rebound, no guarding, no midline pulsatile mass, no hepatomegaly, no splenomegaly EXT:  2 plus pulses throughout, no edema, no cyanosis no clubbing    Lab Results  Component Value Date   CHOL 168 11/19/2014   TRIG 158.0 (H) 11/19/2014   HDL 40.70 11/19/2014   [place] 95 11/19/2014   LDLDIRECT 81.1 07/04/2009    ASSESSMENT AND PLAN  CORONARY ARTERY DISEASE  -  He has had no recent symptoms.  No further cardiac testing is suggested. He continued risk reduction.  HTN - The blood pressure is at target. No change in medications is indicated. We will continue with therapeutic lifestyle changes (TLC).  DYSLIPIDEMIA -  I will have him come back for fasting lipid profile and liver enzymes. For now he'll continue the meds as listed.  OVERWEIGHT - The patient understands the need to lose weight with diet  and exercise. We have discussed specific strategies for this.

## 2015-10-24 ENCOUNTER — Ambulatory Visit (INDEPENDENT_AMBULATORY_CARE_PROVIDER_SITE_OTHER): Payer: Medicare Other | Admitting: Cardiology

## 2015-10-24 ENCOUNTER — Encounter: Payer: Self-pay | Admitting: Cardiology

## 2015-10-24 VITALS — BP 115/66 | HR 78 | Ht 66.0 in | Wt 206.0 lb

## 2015-10-24 DIAGNOSIS — E785 Hyperlipidemia, unspecified: Secondary | ICD-10-CM | POA: Diagnosis not present

## 2015-10-24 DIAGNOSIS — I6523 Occlusion and stenosis of bilateral carotid arteries: Secondary | ICD-10-CM | POA: Diagnosis not present

## 2015-10-24 DIAGNOSIS — I10 Essential (primary) hypertension: Secondary | ICD-10-CM | POA: Diagnosis not present

## 2015-10-24 DIAGNOSIS — I25118 Atherosclerotic heart disease of native coronary artery with other forms of angina pectoris: Secondary | ICD-10-CM

## 2015-10-24 DIAGNOSIS — Z79899 Other long term (current) drug therapy: Secondary | ICD-10-CM

## 2015-10-24 NOTE — Patient Instructions (Signed)
Medication Instructions:  Continue current medications  Labwork: Fasting Lipids Liver  Testing/Procedures: NONE  Follow-Up: Your physician wants you to follow-up in: 1 Year. You will receive a reminder letter in the mail two months in advance. If you don't receive a letter, please call our office to schedule the follow-up appointment.   Any Other Special Instructions Will Be Listed Below (If Applicable).   If you need a refill on your cardiac medications before your next appointment, please call your pharmacy.   

## 2015-10-28 DIAGNOSIS — J3081 Allergic rhinitis due to animal (cat) (dog) hair and dander: Secondary | ICD-10-CM | POA: Diagnosis not present

## 2015-10-28 DIAGNOSIS — J3089 Other allergic rhinitis: Secondary | ICD-10-CM | POA: Diagnosis not present

## 2015-10-28 DIAGNOSIS — J301 Allergic rhinitis due to pollen: Secondary | ICD-10-CM | POA: Diagnosis not present

## 2015-10-31 DIAGNOSIS — E785 Hyperlipidemia, unspecified: Secondary | ICD-10-CM | POA: Diagnosis not present

## 2015-10-31 DIAGNOSIS — Z79899 Other long term (current) drug therapy: Secondary | ICD-10-CM | POA: Diagnosis not present

## 2015-11-01 LAB — HEPATIC FUNCTION PANEL
ALT: 25 U/L (ref 9–46)
AST: 21 U/L (ref 10–35)
Albumin: 4.4 g/dL (ref 3.6–5.1)
Alkaline Phosphatase: 61 U/L (ref 40–115)
Bilirubin, Direct: 0.3 mg/dL — ABNORMAL HIGH (ref <=0.2)
Indirect Bilirubin: 1 mg/dL (ref 0.2–1.2)
Total Bilirubin: 1.3 mg/dL — ABNORMAL HIGH (ref 0.2–1.2)
Total Protein: 6.9 g/dL (ref 6.1–8.1)

## 2015-11-01 LAB — LIPID PANEL
Cholesterol: 147 mg/dL (ref 125–200)
HDL: 43 mg/dL (ref >=40)
LDL Cholesterol: 76 mg/dL (ref <130)
Total CHOL/HDL Ratio: 3.4 Ratio (ref <=5.0)
Triglycerides: 139 mg/dL (ref <150)
VLDL: 28 mg/dL (ref <30)

## 2015-11-04 DIAGNOSIS — J301 Allergic rhinitis due to pollen: Secondary | ICD-10-CM | POA: Diagnosis not present

## 2015-11-04 DIAGNOSIS — J3081 Allergic rhinitis due to animal (cat) (dog) hair and dander: Secondary | ICD-10-CM | POA: Diagnosis not present

## 2015-11-04 DIAGNOSIS — J3089 Other allergic rhinitis: Secondary | ICD-10-CM | POA: Diagnosis not present

## 2015-11-05 ENCOUNTER — Telehealth: Payer: Self-pay | Admitting: *Deleted

## 2015-11-05 DIAGNOSIS — Z8546 Personal history of malignant neoplasm of prostate: Secondary | ICD-10-CM | POA: Diagnosis not present

## 2015-11-05 DIAGNOSIS — N4889 Other specified disorders of penis: Secondary | ICD-10-CM | POA: Diagnosis not present

## 2015-11-05 DIAGNOSIS — R32 Unspecified urinary incontinence: Secondary | ICD-10-CM | POA: Diagnosis not present

## 2015-11-05 NOTE — Telephone Encounter (Signed)
Pt states he has a painful ingrown toenail he worked on Sunday and wanted to know what to do to soothe it until his appt with Dr. Paulla Dolly on Thursday. I spoke with pt and he said he reschedule to tomorrow.  I told pt to use epsom salt soaks over the evening and apply neosporin with Lidocaine after, and take Tylenol as directed if tolerates.

## 2015-11-06 ENCOUNTER — Ambulatory Visit (INDEPENDENT_AMBULATORY_CARE_PROVIDER_SITE_OTHER): Payer: Medicare Other | Admitting: Podiatry

## 2015-11-06 ENCOUNTER — Encounter: Payer: Self-pay | Admitting: Podiatry

## 2015-11-06 VITALS — BP 137/72 | HR 80 | Resp 16 | Ht 66.25 in | Wt 206.0 lb

## 2015-11-06 DIAGNOSIS — I6523 Occlusion and stenosis of bilateral carotid arteries: Secondary | ICD-10-CM | POA: Diagnosis not present

## 2015-11-06 DIAGNOSIS — L600 Ingrowing nail: Secondary | ICD-10-CM

## 2015-11-06 NOTE — Patient Instructions (Signed)

## 2015-11-06 NOTE — Progress Notes (Signed)
   Subjective:    Patient ID: Joshua [name], male    DOB: 04-05-1940, 76 y.o.   MRN: [id number]  HPI Chief Complaint  Patient presents with  . Nail Problem    Left foot; great toe-lateral; x5 days      Review of Systems  HENT: Positive for hearing loss, sinus pressure and tinnitus.   Genitourinary: Positive for frequency and urgency.  Allergic/Immunologic: Positive for environmental allergies and food allergies.  Neurological: Positive for dizziness.  Hematological: Bruises/bleeds easily.  All other systems reviewed and are negative.      Objective:   Physical Exam        Assessment & Plan:

## 2015-11-06 NOTE — Progress Notes (Signed)
Subjective:     Patient ID: Joshua [name], male   DOB: [birth year], 75 y.o.   MRN: [id number]  HPI patient presents with pain in the left hallux nail and states it's been ingrown and he's tried to trim it and soak it without relief of symptoms   Review of Systems  All other systems reviewed and are negative.      Objective:   Physical Exam  Constitutional: He is oriented to person, place, and time.  Cardiovascular: Intact distal pulses.   Musculoskeletal: Normal range of motion.  Neurological: He is oriented to person, place, and time.  Skin: Skin is warm.  Nursing note and vitals reviewed.  neurovascular status intact muscle strength adequate range of motion within normal limits with patient found have incurvated lateral border left hallux with fluid buildup and pain when palpated. Patient is noted to have slight distal redness with no proximal edema erythema drainage and is found to have good digital perfusion and is well oriented 3     Assessment:     Ingrown toenail deformity left hallux lateral border with pain    Plan:     H&P condition reviewed and discussed correction. I explained procedure and risk and patient wants procedure understanding the risk involved and at this time I infiltrated 60 Milligan times like Marcaine mixture remove the lateral border exposed matrix and applied phenol 3 applications 30 seconds followed by alcohol lavage and sterile dressing. Gave instructions on soaks and reappoint

## 2015-11-07 ENCOUNTER — Ambulatory Visit: Payer: Medicare Other | Admitting: Podiatry

## 2015-11-11 ENCOUNTER — Institutional Professional Consult (permissible substitution): Payer: Medicare Other | Admitting: Cardiology

## 2015-11-12 ENCOUNTER — Encounter: Payer: Self-pay | Admitting: Neurology

## 2015-11-12 ENCOUNTER — Telehealth: Payer: Self-pay | Admitting: Family Medicine

## 2015-11-12 DIAGNOSIS — J301 Allergic rhinitis due to pollen: Secondary | ICD-10-CM | POA: Diagnosis not present

## 2015-11-12 DIAGNOSIS — J3081 Allergic rhinitis due to animal (cat) (dog) hair and dander: Secondary | ICD-10-CM | POA: Diagnosis not present

## 2015-11-12 DIAGNOSIS — J3089 Other allergic rhinitis: Secondary | ICD-10-CM | POA: Diagnosis not present

## 2015-11-12 NOTE — Telephone Encounter (Signed)
The patient would like to be called for a flu shot when they become available.

## 2015-11-13 ENCOUNTER — Telehealth: Payer: Self-pay | Admitting: *Deleted

## 2015-11-13 NOTE — Telephone Encounter (Signed)
Pt states had ingrown procedure last week and is still having pain.  I told pt he would have discomfort to varying degrees for up to 4-6 weeks after the procedure. I encouraged pt to switch to 1/2C epsom salt in a 1qt water for his soaks cover with antibiotic ointment after - 1 week 2x d, then 1 week 1x d and allow to air dry when resting or in enclosed shoes.pt states understanding.

## 2015-11-15 NOTE — Telephone Encounter (Signed)
Called patient and left a voicemail message that flu shots are now available and that I would be happy to place him on my injection schedule to receive flu vaccination.

## 2015-11-16 DIAGNOSIS — Z23 Encounter for immunization: Secondary | ICD-10-CM | POA: Diagnosis not present

## 2015-11-18 DIAGNOSIS — J3089 Other allergic rhinitis: Secondary | ICD-10-CM | POA: Diagnosis not present

## 2015-11-18 DIAGNOSIS — J3081 Allergic rhinitis due to animal (cat) (dog) hair and dander: Secondary | ICD-10-CM | POA: Diagnosis not present

## 2015-11-18 DIAGNOSIS — J301 Allergic rhinitis due to pollen: Secondary | ICD-10-CM | POA: Diagnosis not present

## 2015-11-19 DIAGNOSIS — M6281 Muscle weakness (generalized): Secondary | ICD-10-CM | POA: Diagnosis not present

## 2015-11-19 DIAGNOSIS — R278 Other lack of coordination: Secondary | ICD-10-CM | POA: Diagnosis not present

## 2015-11-19 DIAGNOSIS — M62838 Other muscle spasm: Secondary | ICD-10-CM | POA: Diagnosis not present

## 2015-11-19 DIAGNOSIS — R102 Pelvic and perineal pain: Secondary | ICD-10-CM | POA: Diagnosis not present

## 2015-11-26 DIAGNOSIS — M62838 Other muscle spasm: Secondary | ICD-10-CM | POA: Diagnosis not present

## 2015-11-26 DIAGNOSIS — M6281 Muscle weakness (generalized): Secondary | ICD-10-CM | POA: Diagnosis not present

## 2015-11-26 DIAGNOSIS — R278 Other lack of coordination: Secondary | ICD-10-CM | POA: Diagnosis not present

## 2015-11-26 DIAGNOSIS — R102 Pelvic and perineal pain: Secondary | ICD-10-CM | POA: Diagnosis not present

## 2015-11-28 DIAGNOSIS — J3089 Other allergic rhinitis: Secondary | ICD-10-CM | POA: Diagnosis not present

## 2015-11-28 DIAGNOSIS — J301 Allergic rhinitis due to pollen: Secondary | ICD-10-CM | POA: Diagnosis not present

## 2015-11-28 DIAGNOSIS — J3081 Allergic rhinitis due to animal (cat) (dog) hair and dander: Secondary | ICD-10-CM | POA: Diagnosis not present

## 2015-12-03 DIAGNOSIS — J3081 Allergic rhinitis due to animal (cat) (dog) hair and dander: Secondary | ICD-10-CM | POA: Diagnosis not present

## 2015-12-03 DIAGNOSIS — R278 Other lack of coordination: Secondary | ICD-10-CM | POA: Diagnosis not present

## 2015-12-03 DIAGNOSIS — M6281 Muscle weakness (generalized): Secondary | ICD-10-CM | POA: Diagnosis not present

## 2015-12-03 DIAGNOSIS — J301 Allergic rhinitis due to pollen: Secondary | ICD-10-CM | POA: Diagnosis not present

## 2015-12-03 DIAGNOSIS — R102 Pelvic and perineal pain: Secondary | ICD-10-CM | POA: Diagnosis not present

## 2015-12-03 DIAGNOSIS — J3089 Other allergic rhinitis: Secondary | ICD-10-CM | POA: Diagnosis not present

## 2015-12-03 DIAGNOSIS — M62838 Other muscle spasm: Secondary | ICD-10-CM | POA: Diagnosis not present

## 2015-12-10 ENCOUNTER — Other Ambulatory Visit: Payer: Self-pay | Admitting: Family Medicine

## 2015-12-10 DIAGNOSIS — R102 Pelvic and perineal pain: Secondary | ICD-10-CM | POA: Diagnosis not present

## 2015-12-10 DIAGNOSIS — R278 Other lack of coordination: Secondary | ICD-10-CM | POA: Diagnosis not present

## 2015-12-10 DIAGNOSIS — M6281 Muscle weakness (generalized): Secondary | ICD-10-CM | POA: Diagnosis not present

## 2015-12-10 DIAGNOSIS — M62838 Other muscle spasm: Secondary | ICD-10-CM | POA: Diagnosis not present

## 2015-12-13 ENCOUNTER — Encounter: Payer: Self-pay | Admitting: Family Medicine

## 2015-12-13 ENCOUNTER — Ambulatory Visit (INDEPENDENT_AMBULATORY_CARE_PROVIDER_SITE_OTHER): Payer: Medicare Other | Admitting: Family Medicine

## 2015-12-13 VITALS — BP 156/94 | HR 76 | Temp 98.2°F | Resp 20 | Ht 66.25 in | Wt 204.0 lb

## 2015-12-13 DIAGNOSIS — J329 Chronic sinusitis, unspecified: Secondary | ICD-10-CM | POA: Diagnosis not present

## 2015-12-13 DIAGNOSIS — I6523 Occlusion and stenosis of bilateral carotid arteries: Secondary | ICD-10-CM

## 2015-12-13 DIAGNOSIS — I10 Essential (primary) hypertension: Secondary | ICD-10-CM

## 2015-12-13 DIAGNOSIS — B9789 Other viral agents as the cause of diseases classified elsewhere: Secondary | ICD-10-CM

## 2015-12-13 DIAGNOSIS — B349 Viral infection, unspecified: Secondary | ICD-10-CM | POA: Diagnosis not present

## 2015-12-13 NOTE — Patient Instructions (Signed)
No more decongestants!  Delsym for cough as needed  Tylenol for sinus pain and sore throat.   Looks like viral sinusitis. If persists to next Friday or gets significantly better than gets worse I will call in augmentin

## 2015-12-13 NOTE — Progress Notes (Signed)
PCP: [name], MD  Subjective:  Joshua [name] is a 75 y.o. year old very pleasant male patient who presents with sinusitis symptoms including nasal congestion as well as runny nose, maxillary sinus tenderness -other symptoms include: sore throat -day of illness:3 -Symptoms are worsening -previous treatments: decongestant this AM- helped some but BP too high -sick contacts/travel/risks: denies flu exposure. 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 Coronary atherosclerosis 10/12/2006    Priority: High  . Irritable bowel syndrome 10/12/2006    Priority: High  . CKD (chronic kidney disease), stage III 10/23/2013    Priority: Medium  . Neuropathy ([place]) 08/21/2013    Priority: Medium  . 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 Essential hypertension 02/21/2007    Priority: Medium  . Hyperlipidemia 10/12/2006    Priority: Medium  . DEPRESSION 10/12/2006    Priority: Medium  . Asthma 10/12/2006    Priority: Medium  . Allergic rhinitis 05/31/2014    Priority: Low  . Fatigue 11/09/2013    Priority: Low  . Dizziness 08/23/2013    Priority: Low  . RLS (restless legs syndrome) 01/03/2013    Priority: Low  . Low back pain radiating to left leg 10/09/2011    Priority: Low  . OSTEOARTHRITIS, LOWER LEG, LEFT 10/04/2009    Priority: Low  . TOBACCO ABUSE, HX OF 12/26/2008    Priority: Low  . ANEMIA, IRON DEFICIENCY 11/06/2008    Priority: Low  . 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 OSA (obstructive sleep apnea) 11/02/2006    Priority: Low  . GERD 10/12/2006    Priority: Low  . Obesity, Class I, BMI 30-34.9 10/03/2015  . Musculoskeletal chest pain 04/24/2015  . Hereditary and idiopathic peripheral neuropathy 09/25/2014    Medications- reviewed  Current  Outpatient Prescriptions  Medication Sig Dispense Refill  . acetaminophen (TYLENOL) 500 MG tablet Take 1,000 mg by mouth every 6 (six) hours as needed for headache (pain). Reported on 07/02/2015    . albuterol (PROAIR HFA) 108 (90 BASE) MCG/ACT inhaler Inhale 1-2 puffs into the lungs every 6 (six) hours as needed for wheezing or shortness of breath. 18 g 3  . Alpha-Lipoic Acid 300 MG CAPS Take 1 capsule by mouth 2 (two) times daily.    [name] aspirin EC 81 MG tablet Take 81 mg by mouth daily.    . Cyanocobalamin (VITAMIN B 12 PO) Take 800 mg by mouth daily.    [name] desloratadine (CLARINEX) 5 MG tablet Take 5 mg by mouth at bedtime.     . diazepam (VALIUM) 5 MG tablet TAKE ONE TABLET BY MOUTH AT BEDTIME AS NEEDED FOR ANXIETY 30 tablet 2  . FLOVENT HFA 110 MCG/ACT inhaler Inhale 2 puffs into the lungs 2 (two) times daily. Reported on 03/22/2015  5  . fluticasone (FLONASE) 50 MCG/ACT nasal spray Place 1 spray into both nostrils 2 (two) times daily.     . folic acid (FOLVITE) A999333 MCG tablet Take 400 mcg by mouth daily.    [name] gabapentin (NEURONTIN) 100 MG capsule Take 2 caps in AM, 1 cap in evening, 3 caps at bedtime 150 capsule 11  . ketoconazole (NIZORAL) 2 % cream Apply 1 application topically daily.    [name] LAC-HYDRIN FIVE 5 % LOTN lotion APPLY TO AFFECTED AREA(S) TWICE  DAILY 120 mL 2  . MYRBETRIQ 25 MG TB24 tablet     . Naftifine HCl (NAFTIN) 2 % GEL Apply topically 2 (two) times daily.    [name] nystatin (MYCOSTATIN/NYSTOP) powder Apply topically 4 (four) times daily.    [name] nystatin ointment (MYCOSTATIN) Apply 1 application topically 2 (two) times daily.    [name] omeprazole (PRILOSEC) 20 MG capsule TAKE ONE CAPSULE BY MOUTH DAILY AS NEEDED FOR ACID REFLUX/HEARTBURN 90 capsule 2  . ondansetron (ZOFRAN) 8 MG tablet Reported on 07/02/2015  3  . polyethylene glycol powder (GLYCOLAX/MIRALAX) powder Take 17 g by mouth daily. 255 g 0  . Probiotic Product (ALIGN) 4 MG CAPS Take 4 mg by mouth daily.     . simvastatin (ZOCOR)  40 MG tablet Take 1 tablet (40 mg total) by mouth at bedtime. 30 tablet 11  . Tdap (BOOSTRIX) 5-2.5-18.5 LF-MCG/0.5 injection Inject 0.5 mLs into the muscle once. 0.5 mL 0  . valsartan-hydrochlorothiazide (DIOVAN-HCT) 320-25 MG tablet TAKE ONE TABLET BY MOUTH ONCE DAILY 30 tablet 5   No current facility-administered medications for this visit.     Objective: BP (!) 156/94 (BP Location: Left Arm, Patient Position: Sitting, Cuff Size: Normal)   Pulse 76   Temp 98.2 F (36.8 C) (Oral)   Resp 20   Ht 5' 6.25" (1.683 m)   Wt 204 lb (92.5 kg)   SpO2 98%   BMI 32.68 kg/m  Gen: NAD, resting comfortably HEENT: Turbinates erythematous with yellow drainage, TM normal, pharynx mildly erythematous with no tonsilar exudate or edema, maxillary sinus tenderness CV: RRR no murmurs rubs or gallops Lungs: CTAB no crackles, wheeze, rhonchi Ext: no edema Skin: warm, dry, no rash  Assessment/Plan:  Sinsusitis Viral based on <10 days, no double sickening, lack of severity of symptoms in first 3 days. We also discussed reasons why current illness does not meet criteria for bacterial illness and therefore no antibiotics indicated. Also educated on signs that bacterial infection may have developed.   Treatment: -considered steroid: we opted out with his obesity and need to lose weight -other symptomatic care with delsym. Advised strongly to avoid decongestants -Antibiotic indicated: no, but agreed if double sickening or symptoms last to 12/20/15 without significantly improving would call in augmentin for him.   Finally, we reviewed reasons to return to care including if symptoms worsen or persist or new concerns arise.  Will trend BP as myrbetriq can cause bp elevations as well Meds ordered this encounter  Medications  . MYRBETRIQ 25 MG TB24 tablet    [name], MD

## 2015-12-16 ENCOUNTER — Other Ambulatory Visit: Payer: Self-pay

## 2015-12-16 ENCOUNTER — Ambulatory Visit (INDEPENDENT_AMBULATORY_CARE_PROVIDER_SITE_OTHER)
Admission: RE | Admit: 2015-12-16 | Discharge: 2015-12-16 | Disposition: A | Discharge status: Home / Self Care | Payer: Medicare Other | Source: Ambulatory Visit | Attending: Family Medicine | Admitting: Family Medicine

## 2015-12-16 ENCOUNTER — Telehealth: Payer: Self-pay | Admitting: Family Medicine

## 2015-12-16 DIAGNOSIS — R0989 Other specified symptoms and signs involving the circulatory and respiratory systems: Secondary | ICD-10-CM

## 2015-12-16 DIAGNOSIS — R0602 Shortness of breath: Secondary | ICD-10-CM | POA: Diagnosis not present

## 2015-12-16 DIAGNOSIS — R05 Cough: Secondary | ICD-10-CM | POA: Diagnosis not present

## 2015-12-16 NOTE — Telephone Encounter (Signed)
Order for chest x-ray placed and patient is aware to go to [facility] for x-ray.

## 2015-12-16 NOTE — Telephone Encounter (Signed)
Pt seen Friday and dx with sinus infection. However, pt states now states it has moved into his chest and he is worse..  [facility]

## 2015-12-16 NOTE — Telephone Encounter (Signed)
Have him go for chest x-ray. If it is negative- reevaluate Tuesday, Wednesday or thursday

## 2015-12-18 NOTE — Telephone Encounter (Signed)
They were sent to him by mychart- you can read them to him

## 2015-12-18 NOTE — Telephone Encounter (Signed)
Pt is calling requesting xray results

## 2015-12-19 ENCOUNTER — Telehealth: Payer: Self-pay | Admitting: Family Medicine

## 2015-12-19 MED ORDER — AMOXICILLIN-POT CLAVULANATE 875-125 MG PO TABS: 1.0000 | ORAL_TABLET | Freq: Two times a day (BID) | ORAL | 0 refills | Status: DC

## 2015-12-19 NOTE — Telephone Encounter (Signed)
Spoke to patient who verbalized understanding. Reviewed over x-ray results. Patient has already picked up antibiotic from pharmacy.

## 2015-12-19 NOTE — Telephone Encounter (Signed)
Sent in but we had discussed waiting until tomorrow- may pick up today but encourage him to see if improving through today/at least tomorrow am before taking

## 2015-12-19 NOTE — Telephone Encounter (Signed)
Read results to patients. He verbalized understanding.

## 2015-12-19 NOTE — Telephone Encounter (Signed)
Pt was seen on 12-13-15 and would like to start abx. Pt is not better [facility] [address]

## 2015-12-20 DIAGNOSIS — Z8546 Personal history of malignant neoplasm of prostate: Secondary | ICD-10-CM | POA: Diagnosis not present

## 2015-12-20 DIAGNOSIS — N393 Stress incontinence (female) (male): Secondary | ICD-10-CM | POA: Diagnosis not present

## 2015-12-20 DIAGNOSIS — R102 Pelvic and perineal pain: Secondary | ICD-10-CM | POA: Diagnosis not present

## 2015-12-23 DIAGNOSIS — J3089 Other allergic rhinitis: Secondary | ICD-10-CM | POA: Diagnosis not present

## 2015-12-23 DIAGNOSIS — J301 Allergic rhinitis due to pollen: Secondary | ICD-10-CM | POA: Diagnosis not present

## 2015-12-23 DIAGNOSIS — J3081 Allergic rhinitis due to animal (cat) (dog) hair and dander: Secondary | ICD-10-CM | POA: Diagnosis not present

## 2016-01-02 DIAGNOSIS — J3081 Allergic rhinitis due to animal (cat) (dog) hair and dander: Secondary | ICD-10-CM | POA: Diagnosis not present

## 2016-01-02 DIAGNOSIS — J301 Allergic rhinitis due to pollen: Secondary | ICD-10-CM | POA: Diagnosis not present

## 2016-01-02 DIAGNOSIS — J4530 Mild persistent asthma, uncomplicated: Secondary | ICD-10-CM | POA: Diagnosis not present

## 2016-01-02 DIAGNOSIS — J3089 Other allergic rhinitis: Secondary | ICD-10-CM | POA: Diagnosis not present

## 2016-01-07 ENCOUNTER — Ambulatory Visit (INDEPENDENT_AMBULATORY_CARE_PROVIDER_SITE_OTHER): Payer: Medicare Other | Admitting: Neurology

## 2016-01-07 ENCOUNTER — Encounter: Payer: Self-pay | Admitting: Neurology

## 2016-01-07 VITALS — BP 142/76 | HR 86 | Temp 98.2°F | Ht 66.25 in | Wt 208.2 lb

## 2016-01-07 DIAGNOSIS — I6523 Occlusion and stenosis of bilateral carotid arteries: Secondary | ICD-10-CM

## 2016-01-07 DIAGNOSIS — M6281 Muscle weakness (generalized): Secondary | ICD-10-CM | POA: Diagnosis not present

## 2016-01-07 DIAGNOSIS — R278 Other lack of coordination: Secondary | ICD-10-CM | POA: Diagnosis not present

## 2016-01-07 DIAGNOSIS — G609 Hereditary and idiopathic neuropathy, unspecified: Secondary | ICD-10-CM

## 2016-01-07 DIAGNOSIS — R102 Pelvic and perineal pain: Secondary | ICD-10-CM | POA: Diagnosis not present

## 2016-01-07 DIAGNOSIS — M62838 Other muscle spasm: Secondary | ICD-10-CM | POA: Diagnosis not present

## 2016-01-07 MED ORDER — GABAPENTIN 100 MG PO CAPS: ORAL_CAPSULE | ORAL | 11 refills | Status: DC
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
## 2016-01-07 NOTE — Patient Instructions (Signed)
1. Refer for Balance therapy 2. Continue current dose of gabapentin, may increase evening dose 3. Start using more light at night 4. Follow-up in 6 months, call for any changes

## 2016-01-09 DIAGNOSIS — J3089 Other allergic rhinitis: Secondary | ICD-10-CM | POA: Diagnosis not present

## 2016-01-09 DIAGNOSIS — J3081 Allergic rhinitis due to animal (cat) (dog) hair and dander: Secondary | ICD-10-CM | POA: Diagnosis not present

## 2016-01-09 DIAGNOSIS — J301 Allergic rhinitis due to pollen: Secondary | ICD-10-CM | POA: Diagnosis not present

## 2016-01-14 DIAGNOSIS — R262 Difficulty in walking, not elsewhere classified: Secondary | ICD-10-CM | POA: Diagnosis not present

## 2016-01-15 DIAGNOSIS — N393 Stress incontinence (female) (male): Secondary | ICD-10-CM | POA: Diagnosis not present

## 2016-01-15 DIAGNOSIS — M62838 Other muscle spasm: Secondary | ICD-10-CM | POA: Diagnosis not present

## 2016-01-15 DIAGNOSIS — M6281 Muscle weakness (generalized): Secondary | ICD-10-CM | POA: Diagnosis not present

## 2016-01-15 DIAGNOSIS — R278 Other lack of coordination: Secondary | ICD-10-CM | POA: Diagnosis not present

## 2016-01-16 DIAGNOSIS — R262 Difficulty in walking, not elsewhere classified: Secondary | ICD-10-CM | POA: Diagnosis not present

## 2016-01-20 DIAGNOSIS — R262 Difficulty in walking, not elsewhere classified: Secondary | ICD-10-CM | POA: Diagnosis not present

## 2016-01-21 DIAGNOSIS — J3089 Other allergic rhinitis: Secondary | ICD-10-CM | POA: Diagnosis not present

## 2016-01-21 DIAGNOSIS — J301 Allergic rhinitis due to pollen: Secondary | ICD-10-CM | POA: Diagnosis not present

## 2016-01-21 DIAGNOSIS — J3081 Allergic rhinitis due to animal (cat) (dog) hair and dander: Secondary | ICD-10-CM | POA: Diagnosis not present

## 2016-01-27 DIAGNOSIS — R262 Difficulty in walking, not elsewhere classified: Secondary | ICD-10-CM | POA: Diagnosis not present

## 2016-01-29 DIAGNOSIS — M6281 Muscle weakness (generalized): Secondary | ICD-10-CM | POA: Diagnosis not present

## 2016-01-29 DIAGNOSIS — R102 Pelvic and perineal pain: Secondary | ICD-10-CM | POA: Diagnosis not present

## 2016-01-29 DIAGNOSIS — M62838 Other muscle spasm: Secondary | ICD-10-CM | POA: Diagnosis not present

## 2016-01-29 DIAGNOSIS — R278 Other lack of coordination: Secondary | ICD-10-CM | POA: Diagnosis not present

## 2016-01-31 ENCOUNTER — Telehealth: Payer: Self-pay | Admitting: Family Medicine

## 2016-01-31 NOTE — Telephone Encounter (Addendum)
error 

## 2016-02-04 DIAGNOSIS — J3089 Other allergic rhinitis: Secondary | ICD-10-CM | POA: Diagnosis not present

## 2016-02-04 DIAGNOSIS — J3081 Allergic rhinitis due to animal (cat) (dog) hair and dander: Secondary | ICD-10-CM | POA: Diagnosis not present

## 2016-02-04 DIAGNOSIS — J301 Allergic rhinitis due to pollen: Secondary | ICD-10-CM | POA: Diagnosis not present

## 2016-02-05 DIAGNOSIS — R262 Difficulty in walking, not elsewhere classified: Secondary | ICD-10-CM | POA: Diagnosis not present

## 2016-02-11 ENCOUNTER — Ambulatory Visit (INDEPENDENT_AMBULATORY_CARE_PROVIDER_SITE_OTHER): Payer: Medicare Other | Admitting: Family Medicine

## 2016-02-11 ENCOUNTER — Encounter: Payer: Self-pay | Admitting: Family Medicine

## 2016-02-11 VITALS — BP 114/68 | HR 89 | Temp 98.0°F | Wt 207.8 lb

## 2016-02-11 DIAGNOSIS — I6523 Occlusion and stenosis of bilateral carotid arteries: Secondary | ICD-10-CM | POA: Diagnosis not present

## 2016-02-11 DIAGNOSIS — E785 Hyperlipidemia, unspecified: Secondary | ICD-10-CM

## 2016-02-11 DIAGNOSIS — J4540 Moderate persistent asthma, uncomplicated: Secondary | ICD-10-CM

## 2016-02-11 DIAGNOSIS — R102 Pelvic and perineal pain: Secondary | ICD-10-CM | POA: Diagnosis not present

## 2016-02-11 DIAGNOSIS — I10 Essential (primary) hypertension: Secondary | ICD-10-CM | POA: Diagnosis not present

## 2016-02-11 DIAGNOSIS — M6281 Muscle weakness (generalized): Secondary | ICD-10-CM | POA: Diagnosis not present

## 2016-02-11 DIAGNOSIS — M62838 Other muscle spasm: Secondary | ICD-10-CM | POA: Diagnosis not present

## 2016-02-11 DIAGNOSIS — R278 Other lack of coordination: Secondary | ICD-10-CM | POA: Diagnosis not present

## 2016-02-11 MED ORDER — TETANUS-DIPHTH-ACELL PERTUSSIS 5-2.5-18.5 LF-MCG/0.5 IM SUSP: 0.5000 mL | Freq: Once | INTRAMUSCULAR | 0 refills | Status: AC

## 2016-02-11 NOTE — Progress Notes (Signed)
Subjective:  Joshua [name] is a 75 y.o. year old very pleasant male patient who presents for/with See problem oriented charting ROS- No chest pain or shortness of breath. No headache or blurry vision. No recent wheezing  .see any ROS included in HPI as well.   Past Medical History-  Patient Active Problem List   Diagnosis Date Noted  . Coronary atherosclerosis 10/12/2006    Priority: High  . Irritable bowel syndrome 10/12/2006    Priority: High  . CKD (chronic kidney disease), stage III 10/23/2013    Priority: Medium  . Neuropathy ([place]) 08/21/2013    Priority: Medium  . 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 Essential hypertension 02/21/2007    Priority: Medium  . Hyperlipidemia 10/12/2006    Priority: Medium  . DEPRESSION 10/12/2006    Priority: Medium  . Asthma 10/12/2006    Priority: Medium  . Allergic rhinitis 05/31/2014    Priority: Low  . Fatigue 11/09/2013    Priority: Low  . Dizziness 08/23/2013    Priority: Low  . RLS (restless legs syndrome) 01/03/2013    Priority: Low  . Low back pain radiating to left leg 10/09/2011    Priority: Low  . OSTEOARTHRITIS, LOWER LEG, LEFT 10/04/2009    Priority: Low  . TOBACCO ABUSE, HX OF 12/26/2008    Priority: Low  . ANEMIA, IRON DEFICIENCY 11/06/2008    Priority: Low  . 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 OSA (obstructive sleep apnea) 11/02/2006    Priority: Low  . GERD 10/12/2006    Priority: Low  . Obesity, Class I, BMI 30-34.9 10/03/2015  . Musculoskeletal chest pain 04/24/2015  . Hereditary and idiopathic peripheral neuropathy 09/25/2014    Medications- reviewed and updated Current Outpatient Prescriptions  Medication Sig Dispense Refill  . acetaminophen (TYLENOL) 500 MG tablet Take 1,000 mg by mouth every 6 (six) hours as needed for headache (pain). Reported on 07/02/2015    . albuterol (PROAIR HFA) 108 (90 BASE) MCG/ACT inhaler Inhale 1-2 puffs  into the lungs every 6 (six) hours as needed for wheezing or shortness of breath. 18 g 3  . aspirin EC 81 MG tablet Take 81 mg by mouth daily.    . Cyanocobalamin (VITAMIN B 12 PO) Take 800 mg by mouth daily.    [name] desloratadine (CLARINEX) 5 MG tablet Take 5 mg by mouth at bedtime.     . diazepam (VALIUM) 5 MG tablet TAKE ONE TABLET BY MOUTH AT BEDTIME AS NEEDED FOR ANXIETY 30 tablet 2  . FLOVENT HFA 110 MCG/ACT inhaler Inhale 2 puffs into the lungs 2 (two) times daily. Reported on 03/22/2015  5  . fluticasone (FLONASE) 50 MCG/ACT nasal spray Place 1 spray into both nostrils 2 (two) times daily.     . folic acid (FOLVITE) A999333 MCG tablet Take 400 mcg by mouth daily.    [name] gabapentin (NEURONTIN) 100 MG capsule Take 2 caps in AM, 3 caps at bedtime 150 capsule 11  . ketoconazole (NIZORAL) 2 % cream Apply 1 application topically daily.    [name] LAC-HYDRIN FIVE 5 % LOTN lotion APPLY TO AFFECTED AREA(S) TWICE DAILY 120 mL 2  . MYRBETRIQ 25 MG TB24 tablet     . nystatin (MYCOSTATIN/NYSTOP) powder Apply topically 4 (four) times daily.    [name] nystatin ointment (MYCOSTATIN) Apply 1 application topically 2 (two) times daily.    [name] omeprazole (PRILOSEC) 20 MG capsule TAKE ONE CAPSULE BY  MOUTH DAILY AS NEEDED FOR ACID REFLUX/HEARTBURN 90 capsule 2  . polyethylene glycol powder (GLYCOLAX/MIRALAX) powder Take 17 g by mouth daily. 255 g 0  . Probiotic Product (ALIGN) 4 MG CAPS Take 4 mg by mouth daily.     . simvastatin (ZOCOR) 40 MG tablet Take 1 tablet (40 mg total) by mouth at bedtime. 30 tablet 11  . Tdap (BOOSTRIX) 5-2.5-18.5 LF-MCG/0.5 injection Inject 0.5 mLs into the muscle once. 0.5 mL 0  . valsartan-hydrochlorothiazide (DIOVAN-HCT) 320-25 MG tablet TAKE ONE TABLET BY MOUTH ONCE DAILY 30 tablet 5   No current facility-administered medications for this visit.     Objective: BP 114/68 (BP Location: Left Arm, Patient Position: Sitting, Cuff Size: Large)   Pulse 89   Temp 98 F (36.7 C) (Oral)   Wt 207 lb  12.8 oz (94.3 kg)   SpO2 92%   BMI 33.29 kg/m  Gen: NAD, resting comfortably CV: RRR no murmurs rubs or gallops Lungs: CTAB no crackles, wheeze, rhonchi Abdomen: soft/nontender/nondistended/normal bowel sounds. obese Ext: no edema Spirits improved from baseline  Assessment/Plan:  Hyperlipidemia S: improving control on simvastatin 40mg . No myalgias. In gym- waist size going down, weight not doing better- feels he looks better in front of mirror.  Lab Results  Component Value Date   CHOL 147 10/31/2015   HDL 43 10/31/2015   LDLCALC 76 10/31/2015   LDLDIRECT 81.1 07/04/2009   TRIG 139 10/31/2015   CHOLHDL 3.4 10/31/2015   A/P: discussed LDL goal <70 with CAD history. Given increase in exercise- will monitor for now and repeat LDL at follow up  Essential hypertension S: controlled.  on valsartan-hctz 320-25mg . 130s at [place]. At gym again likely helping.  BP Readings from Last 3 Encounters:  02/11/16 114/68  01/07/16 (!) 142/76  12/13/15 (!) 156/94  A/P:Continue current meds:  Much improved with starting back exercise  Asthma S:qvar 2 puffs twice a day. Has not had to use albuterol on this regimen since last visit A/P: winter usually tougher on him- encouraged to keep albuterol on hand and continue regular qvar.    Return in about 4 months (around 06/10/2016) for come fasting. .  Reprint today- use mychart to inform us if he getsthis Meds ordered this encounter  Medications  . Tdap (BOOSTRIX) 5-2.5-18.5 LF-MCG/0.5 injection    Sig: Inject 0.5 mLs into the muscle once.    Dispense:  0.5 mL    Refill:  0    Return precautions advised.  [name], MD

## 2016-02-11 NOTE — Assessment & Plan Note (Signed)
S: improving control on simvastatin 40mg . No myalgias. In gym- waist size going down, weight not doing better- feels he looks better in front of mirror.  Lab Results  Component Value Date   CHOL 147 10/31/2015   HDL 43 10/31/2015   LDLCALC 76 10/31/2015   LDLDIRECT 81.1 07/04/2009   TRIG 139 10/31/2015   CHOLHDL 3.4 10/31/2015   A/P: discussed LDL goal <70 with CAD history. Given increase in exercise- will monitor for now and repeat LDL at follow up

## 2016-02-11 NOTE — Patient Instructions (Signed)
Looks like exercising is helping blood pressure- keep it up- hopefully helps cholesterol too  Lets update labwork next visit  Get your tetanus shot and let us know when  Glad the therapy is helping on 2 fronts

## 2016-02-11 NOTE — Assessment & Plan Note (Signed)
S:qvar 2 puffs twice a day. Has not had to use albuterol on this regimen since last visit A/P: winter usually tougher on him- encouraged to keep albuterol on hand and continue regular qvar.

## 2016-02-11 NOTE — Assessment & Plan Note (Signed)
S: controlled.  on valsartan-hctz 320-25mg . 130s at [place]. At gym again likely helping.  BP Readings from Last 3 Encounters:  02/11/16 114/68  01/07/16 (!) 142/76  12/13/15 (!) 156/94  A/P:Continue current meds:  Much improved with starting back exercise

## 2016-02-11 NOTE — Progress Notes (Signed)
Pre visit review using our clinic review tool, if applicable. No additional management support is needed unless otherwise documented below in the visit note. 

## 2016-02-12 DIAGNOSIS — R262 Difficulty in walking, not elsewhere classified: Secondary | ICD-10-CM | POA: Diagnosis not present

## 2016-02-17 DIAGNOSIS — J301 Allergic rhinitis due to pollen: Secondary | ICD-10-CM | POA: Diagnosis not present

## 2016-02-17 DIAGNOSIS — J3081 Allergic rhinitis due to animal (cat) (dog) hair and dander: Secondary | ICD-10-CM | POA: Diagnosis not present

## 2016-02-17 DIAGNOSIS — J3089 Other allergic rhinitis: Secondary | ICD-10-CM | POA: Diagnosis not present

## 2016-02-19 DIAGNOSIS — R262 Difficulty in walking, not elsewhere classified: Secondary | ICD-10-CM | POA: Diagnosis not present

## 2016-02-25 ENCOUNTER — Other Ambulatory Visit: Payer: Self-pay | Admitting: Family Medicine

## 2016-02-25 DIAGNOSIS — R262 Difficulty in walking, not elsewhere classified: Secondary | ICD-10-CM | POA: Diagnosis not present

## 2016-02-26 ENCOUNTER — Ambulatory Visit: Payer: Medicare Other

## 2016-03-02 DIAGNOSIS — M25551 Pain in right hip: Secondary | ICD-10-CM | POA: Diagnosis not present

## 2016-03-02 DIAGNOSIS — M7061 Trochanteric bursitis, right hip: Secondary | ICD-10-CM | POA: Diagnosis not present

## 2016-03-02 DIAGNOSIS — J3089 Other allergic rhinitis: Secondary | ICD-10-CM | POA: Diagnosis not present

## 2016-03-02 DIAGNOSIS — J301 Allergic rhinitis due to pollen: Secondary | ICD-10-CM | POA: Diagnosis not present

## 2016-03-02 DIAGNOSIS — J3081 Allergic rhinitis due to animal (cat) (dog) hair and dander: Secondary | ICD-10-CM | POA: Diagnosis not present

## 2016-03-04 ENCOUNTER — Telehealth: Payer: Self-pay | Admitting: Family Medicine

## 2016-03-04 ENCOUNTER — Ambulatory Visit: Payer: Medicare Other | Admitting: Family Medicine

## 2016-03-04 ENCOUNTER — Telehealth: Payer: Self-pay

## 2016-03-04 MED ORDER — AMOXICILLIN-POT CLAVULANATE 875-125 MG PO TABS: 1.0000 | ORAL_TABLET | Freq: Two times a day (BID) | ORAL | 0 refills | Status: DC

## 2016-03-04 NOTE — Telephone Encounter (Signed)
Pt walked in to clinic with c/o sinus problems. Pt was scheduled to see Dr Maudie Mercury by Bing Neighbors, however she is not in the office today. Pt was triaged as walk-in and appt was cancelled.   Pt c/o sinus pain/pressure, "bad smell" in his sinuses, dry cough, mild sore throat and low temp of 99.3. Pt states sx present for about 6 days. Pt also c/o "muscle pain" in the front of his neck that seems to come and go.   Spoke with Dr Sarajane Jews and he advises Augmentin 875-125mg , BID, #20. Pt agrees. Pt advised to contact office if not improving. Nothing further needed at this time.

## 2016-03-04 NOTE — Telephone Encounter (Signed)
Patient Name: Joshua [name]  DOB: [birth year]    Initial Comment Caller states, he thinks he has a cold. He has a smell in his sinus infection. History sinus infections. He has started coughing and has a headache. He had 99.3 temp this morning. Took to [employer] at [employer]. Verified    Nurse Assessment  Nurse: Leilani Merl, RN, Heather Date/Time (Eastern Time): 03/04/2016 10:24:37 AM  Confirm and document reason for call. If symptomatic, describe symptoms. ---Caller states, he thinks he has a cold. He has a smell in his sinus infection. History sinus infections. He has started coughing and has a headache for the last 2 days. He had 99.3 temp this morning. Took to Tylenol at 6am.  Does the patient have any new or worsening symptoms? ---Yes  Will a triage be completed? ---Yes  Related visit to physician within the last 2 weeks? ---No  Does the PT have any chronic conditions? (i.e. diabetes, asthma, etc.) ---Yes  List chronic conditions. ---See MR  Is this a behavioral health or substance abuse call? ---No     Guidelines    Guideline Title Affirmed Question Affirmed Notes  Sinus Pain or Congestion [1] Sinus pain (not just congestion) AND [2] fever    Final Disposition User   See Physician within 24 Hours Stand[place], RN, Water quality scientist    Comments  Appt made for today with Dr. Maudie Mercury at 1 pm.   Referrals  REFERRED TO PCP OFFICE   Disagree/Comply: Comply

## 2016-03-04 NOTE — Telephone Encounter (Signed)
Noted  

## 2016-03-09 DIAGNOSIS — R102 Pelvic and perineal pain: Secondary | ICD-10-CM | POA: Diagnosis not present

## 2016-03-09 DIAGNOSIS — M6281 Muscle weakness (generalized): Secondary | ICD-10-CM | POA: Diagnosis not present

## 2016-03-09 DIAGNOSIS — M62838 Other muscle spasm: Secondary | ICD-10-CM | POA: Diagnosis not present

## 2016-03-09 DIAGNOSIS — N393 Stress incontinence (female) (male): Secondary | ICD-10-CM | POA: Diagnosis not present

## 2016-03-11 ENCOUNTER — Ambulatory Visit (INDEPENDENT_AMBULATORY_CARE_PROVIDER_SITE_OTHER): Payer: Medicare Other | Admitting: Adult Health

## 2016-03-11 VITALS — BP 152/70 | Temp 97.7°F | Ht 66.25 in | Wt 208.0 lb

## 2016-03-11 DIAGNOSIS — J019 Acute sinusitis, unspecified: Secondary | ICD-10-CM

## 2016-03-11 DIAGNOSIS — R0789 Other chest pain: Secondary | ICD-10-CM

## 2016-03-11 DIAGNOSIS — I6523 Occlusion and stenosis of bilateral carotid arteries: Secondary | ICD-10-CM

## 2016-03-11 MED ORDER — PREDNISONE 10 MG PO TABS: ORAL_TABLET | ORAL | 0 refills | Status: DC

## 2016-03-11 NOTE — Progress Notes (Signed)
Subjective:    Patient ID: Joshua [name], male    DOB: [birth year], 75 y.o.   MRN: [id number]  HPI  75 year old male who presents to the office today for follow up regarding sinusitis. He was prescribed a 10 day course of Augmentin 7 days ago. He reports that he is feeling slightly better. Currently he continues to have nasal congestion, fatigue and chest congestion. He denies any fevers, or cough.   He is using his OTC nasal sprays and inhaler  Review of Systems  Constitutional: Positive for activity change, appetite change and fatigue. Negative for chills, diaphoresis and fever.  HENT: Positive for congestion and ear pain. Negative for ear discharge, hearing loss, postnasal drip, rhinorrhea, sinus pain, sinus pressure, sore throat, tinnitus and trouble swallowing.   Respiratory: Positive for chest tightness. Negative for cough, shortness of breath and wheezing.   Cardiovascular: Negative.   Gastrointestinal: Negative.   Neurological: Negative.   All other systems reviewed and are negative.  Past Medical History:  Diagnosis Date  . Acute medial meniscal tear   . Anemia   . Arthritis    "middle finger right hand; right knee; neck" (02/06/2014)  . Asthma   . 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 CAD (coronary artery disease)    CABG 2001  . Depression   . Diverticulosis   . DJD (degenerative joint disease)   . GERD (gastroesophageal reflux disease)   . History of blood transfusion 2001   "related to the bypass OR"  . History of gout   . Hyperlipidemia   . Hypertension   . IBS (irritable bowel syndrome)   . Obesity   . OSA (obstructive sleep apnea)   . Pancreatitis ~ 1980  . Pneumonia 1980's X 2  . Prostate cancer ([place]) 04/2010    Social History   Social History  . Marital status: Married    Spouse name: N/A  . Number of children: N/A  . Years of education: N/A   Occupational History  . retired Retired   Social History Main  Topics  . Smoking status: Former Smoker    Packs/day: 1.00    Years: 35.00    Types: Cigarettes    Quit date: 06/16/1992  . Smokeless tobacco: Never Used  . Alcohol use 2.4 oz/week    4 Shots of liquor per week     Comment: mixed drink  . Drug use: No  . Sexual activity: No   Other Topics Concern  . Not on file   Social History Narrative   Married 42 years in 2015. No kids (mumps at age 25)   Retired from [employer] in [employer] 2x a week   Hobbies: Chief Strategy Officer, movies and tv, staying active    Past Surgical History:  Procedure Laterality Date  . ANTERIOR CERVICAL DECOMP/DISCECTOMY FUSION  05/26/06  . BACK SURGERY    . CARDIAC CATHETERIZATION  11/20/1999  . CORONARY ARTERY BYPASS GRAFT  11/20/1999   SVG-RI1-RI2, SVG-OM, SVG-dRCA  . KNEE ARTHROSCOPY Right 1992; 04/2008  . LAPAROSCOPIC CHOLECYSTECTOMY  2007  . NASAL SEPTUM SURGERY Right 2007  . ROBOT ASSISTED LAPAROSCOPIC RADICAL PROSTATECTOMY  04/2010  . THYROIDECTOMY, PARTIAL  1980    Family History  Problem Relation Age of Onset  . Heart disease Mother   . Hyperlipidemia Mother   . Heart disease Father   . Hyperlipidemia Father   . Diabetes Brother     Allergies  Allergen  Reactions  . Azithromycin Other (See Comments)    Dizziness and double vision   . 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 Bromfed Nausea And Vomiting  . Clarithromycin Other (See Comments)     gastritis  . Codeine Other (See Comments)    Trouble breathing  . Doxycycline Hyclate Nausea And Vomiting  . Modafinil Other (See Comments)     anxiety-nervousness  . Promethazine Hcl Other (See Comments)     fainting  . Quinolones     Cipro  . Telithromycin Nausea And Vomiting  . Erythromycin Base Rash  . Hydrocodone-Acetaminophen Rash  . Sulfonamide Derivatives Rash    Current Outpatient Prescriptions on File Prior to Visit  Medication Sig Dispense Refill  . acetaminophen  (TYLENOL) 500 MG tablet Take 1,000 mg by mouth every 6 (six) hours as needed for headache (pain). Reported on 07/02/2015    . albuterol (PROAIR HFA) 108 (90 BASE) MCG/ACT inhaler Inhale 1-2 puffs into the lungs every 6 (six) hours as needed for wheezing or shortness of breath. 18 g 3  . amoxicillin-clavulanate (AUGMENTIN) 875-125 MG tablet Take 1 tablet by mouth 2 (two) times daily. 20 tablet 0  . aspirin EC 81 MG tablet Take 81 mg by mouth daily.    . Cyanocobalamin (VITAMIN B 12 PO) Take 800 mg by mouth daily.    [name] desloratadine (CLARINEX) 5 MG tablet Take 5 mg by mouth at bedtime.     . diazepam (VALIUM) 5 MG tablet TAKE ONE TABLET BY MOUTH AT BEDTIME AS NEEDED FOR ANXIETY 30 tablet 2  . FLOVENT HFA 110 MCG/ACT inhaler Inhale 2 puffs into the lungs 2 (two) times daily. Reported on 03/22/2015  5  . fluticasone (FLONASE) 50 MCG/ACT nasal spray Place 1 spray into both nostrils 2 (two) times daily.     . folic acid (FOLVITE) A999333 MCG tablet Take 400 mcg by mouth daily.    [name] gabapentin (NEURONTIN) 100 MG capsule Take 2 caps in AM, 3 caps at bedtime 150 capsule 11  . ketoconazole (NIZORAL) 2 % cream Apply 1 application topically daily.    [name] LAC-HYDRIN FIVE 5 % LOTN lotion APPLY TO AFFECTED AREA(S) TWICE DAILY 120 mL 2  . MYRBETRIQ 25 MG TB24 tablet     . nystatin (MYCOSTATIN/NYSTOP) powder Apply topically 4 (four) times daily.    [name] nystatin ointment (MYCOSTATIN) Apply 1 application topically 2 (two) times daily.    [name] omeprazole (PRILOSEC) 20 MG capsule TAKE ONE CAPSULE BY MOUTH ONCE DAILY AS NEEDED FOR  ACID  REFLUX/HEARTBURN 90 capsule 2  . polyethylene glycol powder (GLYCOLAX/MIRALAX) powder Take 17 g by mouth daily. 255 g 0  . Probiotic Product (ALIGN) 4 MG CAPS Take 4 mg by mouth daily.     . simvastatin (ZOCOR) 40 MG tablet Take 1 tablet (40 mg total) by mouth at bedtime. 30 tablet 11  . valsartan-hydrochlorothiazide (DIOVAN-HCT) 320-25 MG tablet TAKE ONE TABLET BY MOUTH ONCE DAILY 30 tablet 5    No current facility-administered medications on file prior to visit.     BP (!) 152/70   Temp 97.7 F (36.5 C) (Oral)   Ht 5' 6.25" (1.683 m)   Wt 208 lb (94.3 kg)   BMI 33.32 kg/m       Objective:   Physical Exam  Constitutional: He appears well-developed and well-nourished. No distress.  HENT:  Head: Normocephalic and atraumatic.  Right Ear: Hearing, tympanic  membrane, external ear and ear canal normal.  Left Ear: Hearing, tympanic membrane, external ear and ear canal normal.  Nose: Nose normal. No mucosal edema or rhinorrhea. Right sinus exhibits no maxillary sinus tenderness and no frontal sinus tenderness. Left sinus exhibits no frontal sinus tenderness.  Mouth/Throat: Oropharynx is clear and moist. No oropharyngeal exudate.  Eyes: Conjunctivae and EOM are normal. Pupils are equal, round, and reactive to light. Right eye exhibits no discharge. Left eye exhibits no discharge.  Neck: Normal range of motion. Neck supple.  Cardiovascular: Normal rate, regular rhythm, normal heart sounds and intact distal pulses.  Exam reveals no gallop and no friction rub.   No murmur heard. Pulmonary/Chest: Effort normal and breath sounds normal. No respiratory distress. He has no wheezes. He has no rales. He exhibits no tenderness.  Sounds tight in all lung fields   Abdominal: Soft. Bowel sounds are normal. He exhibits no distension and no mass. There is no tenderness. There is no rebound and no guarding.  Lymphadenopathy:    He has no cervical adenopathy.  Skin: Skin is warm and dry. No rash noted. He is not diaphoretic. No erythema. No pallor.  Psychiatric: He has a normal mood and affect. His behavior is normal. Judgment and thought content normal.  Nursing note and vitals reviewed.     Assessment & Plan:  1. Acute non-recurrent sinusitis, unspecified location - Continue with full course of antibiotics  - Continue with FLonase  - Follow up if no improvement after finishing  antibiotics  2. Chest tightness - Will treat with prednisone to help with chest tightness for URI like symptoms. Continue with inahlers  - predniSONE (DELTASONE) 10 MG tablet; 40 mg x 3 days, 20 mg x 3 days, 10 mg x 3 days  Dispense: 21 tablet; Refill: 0  Dorothyann Peng, NP

## 2016-03-12 DIAGNOSIS — D692 Other nonthrombocytopenic purpura: Secondary | ICD-10-CM | POA: Diagnosis not present

## 2016-03-12 DIAGNOSIS — D1801 Hemangioma of skin and subcutaneous tissue: Secondary | ICD-10-CM | POA: Diagnosis not present

## 2016-03-12 DIAGNOSIS — L821 Other seborrheic keratosis: Secondary | ICD-10-CM | POA: Diagnosis not present

## 2016-03-12 DIAGNOSIS — D2372 Other benign neoplasm of skin of left lower limb, including hip: Secondary | ICD-10-CM | POA: Diagnosis not present

## 2016-03-12 DIAGNOSIS — L570 Actinic keratosis: Secondary | ICD-10-CM | POA: Diagnosis not present

## 2016-03-12 DIAGNOSIS — D3612 Benign neoplasm of peripheral nerves and autonomic nervous system, upper limb, including shoulder: Secondary | ICD-10-CM | POA: Diagnosis not present

## 2016-03-12 DIAGNOSIS — L918 Other hypertrophic disorders of the skin: Secondary | ICD-10-CM | POA: Diagnosis not present

## 2016-03-17 DIAGNOSIS — R262 Difficulty in walking, not elsewhere classified: Secondary | ICD-10-CM | POA: Diagnosis not present

## 2016-03-18 DIAGNOSIS — J301 Allergic rhinitis due to pollen: Secondary | ICD-10-CM | POA: Diagnosis not present

## 2016-03-18 DIAGNOSIS — J3081 Allergic rhinitis due to animal (cat) (dog) hair and dander: Secondary | ICD-10-CM | POA: Diagnosis not present

## 2016-03-18 DIAGNOSIS — J3089 Other allergic rhinitis: Secondary | ICD-10-CM | POA: Diagnosis not present

## 2016-03-19 ENCOUNTER — Encounter: Payer: Self-pay | Admitting: Adult Health

## 2016-03-19 ENCOUNTER — Telehealth: Payer: Self-pay | Admitting: Family Medicine

## 2016-03-19 ENCOUNTER — Ambulatory Visit (INDEPENDENT_AMBULATORY_CARE_PROVIDER_SITE_OTHER): Payer: Medicare Other | Admitting: Adult Health

## 2016-03-19 VITALS — BP 164/76 | Temp 98.1°F | Wt 205.6 lb

## 2016-03-19 DIAGNOSIS — I6523 Occlusion and stenosis of bilateral carotid arteries: Secondary | ICD-10-CM

## 2016-03-19 DIAGNOSIS — J069 Acute upper respiratory infection, unspecified: Secondary | ICD-10-CM | POA: Diagnosis not present

## 2016-03-19 NOTE — Telephone Encounter (Signed)
Spoke with Joshua [name] and he states that his cough has not resolved. Advised Joshua [name] that cough can take up to 6-8 weeks to fully resolve. Joshua [name] also c/o some chest tightness/pressure. Joshua [name] reports cough is worse at night after laying down to sleep. Joshua [name] reports increase in mucus production. Appt with Tommi Rumps this afternoon. Nothing further needed at this time.

## 2016-03-19 NOTE — Progress Notes (Signed)
Subjective:    Patient ID: Joshua [name], male    DOB: 1940/08/02, 75 y.o.   MRN: [id number]  HPI  75 year old male who  has a past medical history of Acute medial meniscal tear; Anemia; Arthritis; Asthma; Bladder cancer ([place]) (04/2010); CAD (coronary artery disease); Depression; Diverticulosis; DJD (degenerative joint disease); GERD (gastroesophageal reflux disease); History of blood transfusion (2001); History of gout; Hyperlipidemia; Hypertension; IBS (irritable bowel syndrome); Obesity; OSA (obstructive sleep apnea); Pancreatitis (~ 1980); Pneumonia (1980's X 2); and Prostate cancer ([place]) (04/2010). He presents to the office today for follow up regarding sinusitis/URI type symptoms.   He was prescribed Augmentin on 03/04/2016 for his symptoms which has started about 6 days prior. Towards the end of his abx treatment he was seen by this writer for continued symptoms that appeared to be resolving. He was prescribed prednisone which he is finishing. He reports that the prednisone " made me mean, made me hungry, and didn't let me sleep."   He continues to have nasal congestion, fatigue, non productive cough and chest congestion. He denies any fevers.   He has been using his inhalers and OTC medications   Review of Systems See HPI  Past Medical History:  Diagnosis Date  . Acute medial meniscal tear   . Anemia   . Arthritis    "middle finger right hand; right knee; neck" (02/06/2014)  . Asthma   . Bladder cancer ([place]) 04/2010   "cauterized during prostate OR"  . CAD (coronary artery disease)    CABG 2001  . Depression   . Diverticulosis   . DJD (degenerative joint disease)   . GERD (gastroesophageal reflux disease)   . History of blood transfusion 2001   "related to the bypass OR"  . History of gout   . Hyperlipidemia   . Hypertension   . IBS (irritable bowel syndrome)   . Obesity   . OSA (obstructive sleep apnea)   . Pancreatitis ~ 1980  . Pneumonia 1980's X 2  .  Prostate cancer ([place]) 04/2010    Social History   Social History  . Marital status: Married    Spouse name: N/A  . Number of children: N/A  . Years of education: N/A   Occupational History  . retired Retired   Social History Main Topics  . Smoking status: Former Smoker    Packs/day: 1.00    Years: 35.00    Types: Cigarettes    Quit date: 06/16/1992  . Smokeless tobacco: Never Used  . Alcohol use 2.4 oz/week    4 Shots of liquor per week     Comment: mixed drink  . Drug use: No  . Sexual activity: No   Other Topics Concern  . Not on file   Social History Narrative   Married 42 years in 2015. No kids (mumps at age 21)   Retired from [employer] in [employer] 2x a week   Hobbies: Chief Strategy Officer, movies and tv, staying active    Past Surgical History:  Procedure Laterality Date  . ANTERIOR CERVICAL DECOMP/DISCECTOMY FUSION  05/26/06  . BACK SURGERY    . CARDIAC CATHETERIZATION  11/20/1999  . CORONARY ARTERY BYPASS GRAFT  11/20/1999   SVG-RI1-RI2, SVG-OM, SVG-dRCA  . KNEE ARTHROSCOPY Right 1992; 04/2008  . LAPAROSCOPIC CHOLECYSTECTOMY  2007  . NASAL SEPTUM SURGERY Right 2007  . ROBOT ASSISTED LAPAROSCOPIC RADICAL PROSTATECTOMY  04/2010  . THYROIDECTOMY, PARTIAL  1980    Family History  Problem Relation  Age of Onset  . Heart disease Mother   . Hyperlipidemia Mother   . Heart disease Father   . Hyperlipidemia Father   . Diabetes Brother     Allergies  Allergen Reactions  . Azithromycin Other (See Comments)    Dizziness and double vision   . 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 Bromfed Nausea And Vomiting  . Clarithromycin Other (See Comments)     gastritis  . Codeine Other (See Comments)    Trouble breathing  . Doxycycline Hyclate Nausea And Vomiting  . Modafinil Other (See Comments)     anxiety-nervousness  . Promethazine Hcl Other (See Comments)     fainting  . Quinolones     Cipro  .  Telithromycin Nausea And Vomiting  . Erythromycin Base Rash  . Hydrocodone-Acetaminophen Rash  . Sulfonamide Derivatives Rash    Current Outpatient Prescriptions on File Prior to Visit  Medication Sig Dispense Refill  . acetaminophen (TYLENOL) 500 MG tablet Take 1,000 mg by mouth every 6 (six) hours as needed for headache (pain). Reported on 07/02/2015    . albuterol (PROAIR HFA) 108 (90 BASE) MCG/ACT inhaler Inhale 1-2 puffs into the lungs every 6 (six) hours as needed for wheezing or shortness of breath. 18 g 3  . aspirin EC 81 MG tablet Take 81 mg by mouth daily.    . Cyanocobalamin (VITAMIN B 12 PO) Take 800 mg by mouth daily.    [name] desloratadine (CLARINEX) 5 MG tablet Take 5 mg by mouth at bedtime.     . diazepam (VALIUM) 5 MG tablet TAKE ONE TABLET BY MOUTH AT BEDTIME AS NEEDED FOR ANXIETY 30 tablet 2  . FLOVENT HFA 110 MCG/ACT inhaler Inhale 2 puffs into the lungs 2 (two) times daily. Reported on 03/22/2015  5  . fluticasone (FLONASE) 50 MCG/ACT nasal spray Place 1 spray into both nostrils 2 (two) times daily.     . folic acid (FOLVITE) A999333 MCG tablet Take 400 mcg by mouth daily.    [name] gabapentin (NEURONTIN) 100 MG capsule Take 2 caps in AM, 3 caps at bedtime 150 capsule 11  . ketoconazole (NIZORAL) 2 % cream Apply 1 application topically daily.    [name] LAC-HYDRIN FIVE 5 % LOTN lotion APPLY TO AFFECTED AREA(S) TWICE DAILY 120 mL 2  . MYRBETRIQ 25 MG TB24 tablet     . nystatin (MYCOSTATIN/NYSTOP) powder Apply topically 4 (four) times daily.    [name] nystatin ointment (MYCOSTATIN) Apply 1 application topically 2 (two) times daily.    [name] omeprazole (PRILOSEC) 20 MG capsule TAKE ONE CAPSULE BY MOUTH ONCE DAILY AS NEEDED FOR  ACID  REFLUX/HEARTBURN 90 capsule 2  . polyethylene glycol powder (GLYCOLAX/MIRALAX) powder Take 17 g by mouth daily. 255 g 0  . predniSONE (DELTASONE) 10 MG tablet 40 mg x 3 days, 20 mg x 3 days, 10 mg x 3 days 21 tablet 0  . Probiotic Product (ALIGN) 4 MG CAPS Take 4 mg by  mouth daily.     . simvastatin (ZOCOR) 40 MG tablet Take 1 tablet (40 mg total) by mouth at bedtime. 30 tablet 11  . valsartan-hydrochlorothiazide (DIOVAN-HCT) 320-25 MG tablet TAKE ONE TABLET BY MOUTH ONCE DAILY 30 tablet 5   No current facility-administered medications on file prior to visit.     BP (!) 164/76   Temp 98.1 F (36.7 C) (Oral)   Wt 205 lb 9.6 oz (93.3 kg)  BMI 32.93 kg/m       Objective:   Physical Exam  Constitutional: He is oriented to person, place, and time. He appears well-developed and well-nourished. No distress.  HENT:  Head: Normocephalic and atraumatic.  Right Ear: Hearing, tympanic membrane, external ear and ear canal normal.  Left Ear: Hearing, tympanic membrane, external ear and ear canal normal.  Nose: Mucosal edema and rhinorrhea present. Right sinus exhibits maxillary sinus tenderness and frontal sinus tenderness. Left sinus exhibits no maxillary sinus tenderness and no frontal sinus tenderness.  Mouth/Throat: Uvula is midline, oropharynx is clear and moist and mucous membranes are normal. No oropharyngeal exudate.  Eyes: Conjunctivae and EOM are normal. Pupils are equal, round, and reactive to light. Right eye exhibits no discharge. Left eye exhibits no discharge. No scleral icterus.  Neck: Normal range of motion. Neck supple. No thyromegaly present.  Cardiovascular: Normal rate, regular rhythm, normal heart sounds and intact distal pulses.  Exam reveals no gallop and no friction rub.   No murmur heard. Pulmonary/Chest: Effort normal and breath sounds normal. No respiratory distress. He has no wheezes. He has no rales. He exhibits no tenderness.  Musculoskeletal: Normal range of motion.  Lymphadenopathy:    He has no cervical adenopathy.  Neurological: He is alert and oriented to person, place, and time.  Skin: Skin is warm and dry. No rash noted. He is not diaphoretic. No erythema. No pallor.  Psychiatric: He has a normal mood and affect. His  behavior is normal. Judgment and thought content normal.  Nursing note and vitals reviewed.      Assessment & Plan:  1. Upper respiratory tract infection, unspecified type - Will get chest x ray to r/o pneumonia. Consider second round of antibiotics.  - DG Chest 2 View; Future  Dorothyann Peng, NP

## 2016-03-19 NOTE — Telephone Encounter (Signed)
[facility] - Client [facility] Patient Name: Joshua [name] DOB: 08/23/1940 Initial Comment Caller states he has been seen twice for same sx. Having chest heaviness. Nurse Assessment Nurse: Andria Frames, RN, Aeriel Date/Time (Eastern Time): 03/19/2016 9:06:17 AM Confirm and document reason for call. If symptomatic, describe symptoms. ---Caller states, he has been having chest heaviness for 3 or 4 weeks now. He has been on amoxicillin and prednisone and it is causing him to have sleep issues and anger issues with that though done with prednisone. Not having anger issues right now. He saw [place] and Georgina Snell. He is finishing up the prednisone and he is not feeling better. He uses a dental appliance for the sleep apnea. He develops a sore throat mucous, and cough. His cough is what got him in. Typically he gets really sick when he starts with a cough. He went in for that 3 weeks ago. He feels like he has pressure on the chest. He is not having any difficulty breathing at all or going up and down stairs. It is just there. He is not coughing stuff up and he is tired a lot. Does the patient have any new or worsening symptoms? ---Yes Will a triage be completed? ---Yes Related visit to physician within the last 2 weeks? ---Yes Does the PT have any chronic conditions? (i.e. diabetes, asthma, etc.) ---Yes List chronic conditions. ---asthma, allergies, high blood pressure, high cholesterol Is this a behavioral health or substance abuse call? ---No Guidelines Guideline Title Affirmed Question Affirmed Notes Chest Pain [1] Chest pain lasts > 5 minutes AND [2] described as crushing, pressure-like, or heavy Final Disposition User Call EMS 911 Now Hensel, RN, Aeriel Comments Last night he felt really good. When he got home he felt really good. He was up late and then he went to bed and about he started waking up with a sore throat.  He is using his inhalers. Disagree/Comply: Disagree Disagree/Comply Reason: Disagree with instructions

## 2016-03-20 ENCOUNTER — Ambulatory Visit (INDEPENDENT_AMBULATORY_CARE_PROVIDER_SITE_OTHER)
Admission: RE | Admit: 2016-03-20 | Discharge: 2016-03-20 | Disposition: A | Discharge status: Home / Self Care | Payer: Medicare Other | Source: Ambulatory Visit | Attending: Adult Health | Admitting: Adult Health

## 2016-03-20 DIAGNOSIS — J069 Acute upper respiratory infection, unspecified: Secondary | ICD-10-CM

## 2016-03-20 DIAGNOSIS — R05 Cough: Secondary | ICD-10-CM | POA: Diagnosis not present

## 2016-03-20 DIAGNOSIS — R0602 Shortness of breath: Secondary | ICD-10-CM | POA: Diagnosis not present

## 2016-03-20 MED ORDER — AZITHROMYCIN 250 MG PO TABS: ORAL_TABLET | ORAL | 0 refills | Status: DC

## 2016-03-20 NOTE — Telephone Encounter (Signed)
Spoke to patient and informed him of his chest x ray results. He reports that his sinus infection is getting worse and he has been sleeping all day. He has tried a z -pack in the past and reported doing well with this. Despite having a allergy to this medication.   He would like to try a z pack again  Advised to follow up with Dr. Yong Channel if no improvement

## 2016-03-20 NOTE — Telephone Encounter (Signed)
Pt states he saw [facility] yesterday, went for chest xray and would like to know if we have the results yet? Pt hoping to get a rx, he is feeling worse than ever now.  [facility] [address]

## 2016-03-20 NOTE — Telephone Encounter (Signed)
See below

## 2016-03-26 ENCOUNTER — Encounter: Payer: Self-pay | Admitting: Family Medicine

## 2016-03-26 ENCOUNTER — Ambulatory Visit (INDEPENDENT_AMBULATORY_CARE_PROVIDER_SITE_OTHER): Payer: Medicare Other | Admitting: Family Medicine

## 2016-03-26 VITALS — BP 120/70 | HR 82 | Temp 97.8°F | Ht 66.25 in | Wt 205.7 lb

## 2016-03-26 DIAGNOSIS — J069 Acute upper respiratory infection, unspecified: Secondary | ICD-10-CM

## 2016-03-26 NOTE — Patient Instructions (Signed)
INSTRUCTIONS FOR UPPER RESPIRATORY INFECTION:  -plenty of rest and fluids  -nasal saline wash 2-3 times daily (use prepackaged nasal saline or bottled/distilled water if making your own)   -in the winter time, using a humidifier at night is helpful (please follow cleaning instructions)  -if you are taking a cough medication - use only as directed, may also try a teaspoon of honey to coat the throat and throat lozenges.  -for sore throat, salt water gargles can help  -follow up if you have fevers, persistent facial pain, tooth pain, difficulty breathing or are worsening or symptoms persist longer then expected  Upper Respiratory Infection, Adult An upper respiratory infection (URI) is also known as the common cold. It is often caused by a type of germ (virus). Colds are easily spread (contagious). You can pass it to others by kissing, coughing, sneezing, or drinking out of the same glass. Usually, you get better in 1 to 3  weeks.  However, the cough can last for even longer. HOME CARE   Only take medicine as told by your doctor. Follow instructions provided above.  Drink enough water and fluids to keep your pee (urine) clear or pale yellow.  Get plenty of rest.  Return to work when your temperature is < 100 for 24 hours or as told by your doctor. You may use a face mask and wash your hands to stop your cold from spreading. GET HELP RIGHT AWAY IF:   After the first few days, you feel you are getting worse.  You have questions about your medicine.  You have chills, shortness of breath, or red spit (mucus).  You have pain in the face for more then 1-2 days, especially when you bend forward.  You have a fever, puffy (swollen) neck, pain when you swallow, or white spots in the back of your throat.  You have a bad headache, ear pain, sinus pain, or chest pain.  You have a high-pitched whistling sound when you breathe in and out (wheezing).  You cough up blood.  You have sore  muscles or a stiff neck. MAKE SURE YOU:   Understand these instructions.  Will watch your condition.  Will get help right away if you are not doing well or get worse. Document Released: 08/26/2007 Document Revised: 06/01/2011 Document Reviewed: 06/14/2013 [facility] Patient Information 2015 [place], [place]. This information is not intended to replace advice given to you by your health care provider. Make sure you discuss any questions you have with your health care provider.

## 2016-03-26 NOTE — Progress Notes (Signed)
HPI:  Joshua [name] Is a pleasant 76 year old here for an acute visit regarding sinus congestion. He had what sounds like a sinus infection last month, treated with Augmentin. He reports he got somewhat better, but symptoms persisted and he was treated with azithromycin about a week or so ago. He was doing well, but then he and his wife came down with cough, sneezing and nasal congestion a few days ago. He had some sinus pain. This all has now seemed to improve and he almost canceled his appointment. Denies fever, body aches, shortness of breath, wheezing or any persistent sinus pain at this time.  ROS: See pertinent positives and negatives per HPI.  Past Medical History:  Diagnosis Date  . Acute medial meniscal tear   . Anemia   . Arthritis    "middle finger right hand; right knee; neck" (02/06/2014)  . Asthma   . Bladder cancer ([place]) 04/2010   "cauterized during prostate OR"  . CAD (coronary artery disease)    CABG 2001  . Depression   . Diverticulosis   . DJD (degenerative joint disease)   . GERD (gastroesophageal reflux disease)   . History of blood transfusion 2001   "related to the bypass OR"  . History of gout   . Hyperlipidemia   . Hypertension   . IBS (irritable bowel syndrome)   . Obesity   . OSA (obstructive sleep apnea)   . Pancreatitis ~ 1980  . Pneumonia 1980's X 2  . Prostate cancer ([place]) 04/2010    Past Surgical History:  Procedure Laterality Date  . ANTERIOR CERVICAL DECOMP/DISCECTOMY FUSION  05/26/06  . BACK SURGERY    . CARDIAC CATHETERIZATION  11/20/1999  . CORONARY ARTERY BYPASS GRAFT  11/20/1999   SVG-RI1-RI2, SVG-OM, SVG-dRCA  . KNEE ARTHROSCOPY Right 1992; 04/2008  . LAPAROSCOPIC CHOLECYSTECTOMY  2007  . NASAL SEPTUM SURGERY Right 2007  . ROBOT ASSISTED LAPAROSCOPIC RADICAL PROSTATECTOMY  04/2010  . THYROIDECTOMY, PARTIAL  1980    Family History  Problem Relation Age of Onset  . Heart disease Mother   . Hyperlipidemia Mother   . Heart disease  Father   . Hyperlipidemia Father   . Diabetes Brother     Social History   Social History  . Marital status: Married    Spouse name: N/A  . Number of children: N/A  . Years of education: N/A   Occupational History  . retired Retired   Social History Main Topics  . Smoking status: Former Smoker    Packs/day: 1.00    Years: 35.00    Types: Cigarettes    Quit date: 06/16/1992  . Smokeless tobacco: Never Used  . Alcohol use 2.4 oz/week    4 Shots of liquor per week     Comment: mixed drink  . Drug use: No  . Sexual activity: No   Other Topics Concern  . None   Social History Narrative   Married 42 years in 2015. No kids (mumps at age 76)   Retired from [employer] in [employer] 2x a week   Hobbies: Chief Strategy Officer, movies and tv, staying active     Current Outpatient Prescriptions:  .  acetaminophen (TYLENOL) 500 MG tablet, Take 1,000 mg by mouth every 6 (six) hours as needed for headache (pain). Reported on 07/02/2015, Disp: , Rfl:  .  albuterol (PROAIR HFA) 108 (90 BASE) MCG/ACT inhaler, Inhale 1-2 puffs into the lungs every 6 (six) hours as needed for wheezing or shortness of  breath., Disp: 18 g, Rfl: 3 .  aspirin EC 81 MG tablet, Take 81 mg by mouth daily., Disp: , Rfl:  .  Cyanocobalamin (VITAMIN B 12 PO), Take 800 mg by mouth daily., Disp: , Rfl:  .  desloratadine (CLARINEX) 5 MG tablet, Take 5 mg by mouth at bedtime. , Disp: , Rfl:  .  diazepam (VALIUM) 5 MG tablet, TAKE ONE TABLET BY MOUTH AT BEDTIME AS NEEDED FOR ANXIETY, Disp: 30 tablet, Rfl: 2 .  FLOVENT HFA 110 MCG/ACT inhaler, Inhale 2 puffs into the lungs 2 (two) times daily. Reported on 03/22/2015, Disp: , Rfl: 5 .  fluticasone (FLONASE) 50 MCG/ACT nasal spray, Place 1 spray into both nostrils 2 (two) times daily. , Disp: , Rfl:  .  folic acid (FOLVITE) A999333 MCG tablet, Take 400 mcg by mouth daily., Disp: , Rfl:  .  gabapentin (NEURONTIN) 100 MG capsule, Take 2 caps in AM, 3 caps at bedtime, Disp: 150  capsule, Rfl: 11 .  ketoconazole (NIZORAL) 2 % cream, Apply 1 application topically daily., Disp: , Rfl:  .  LAC-HYDRIN FIVE 5 % LOTN lotion, APPLY TO AFFECTED AREA(S) TWICE DAILY, Disp: 120 mL, Rfl: 2 .  MYRBETRIQ 25 MG TB24 tablet, , Disp: , Rfl:  .  nystatin (MYCOSTATIN/NYSTOP) powder, Apply topically 4 (four) times daily., Disp: , Rfl:  .  nystatin ointment (MYCOSTATIN), Apply 1 application topically 2 (two) times daily., Disp: , Rfl:  .  omeprazole (PRILOSEC) 20 MG capsule, TAKE ONE CAPSULE BY MOUTH ONCE DAILY AS NEEDED FOR  ACID  REFLUX/HEARTBURN, Disp: 90 capsule, Rfl: 2 .  polyethylene glycol powder (GLYCOLAX/MIRALAX) powder, Take 17 g by mouth daily., Disp: 255 g, Rfl: 0 .  Probiotic Product (ALIGN) 4 MG CAPS, Take 4 mg by mouth daily. , Disp: , Rfl:  .  simvastatin (ZOCOR) 40 MG tablet, Take 1 tablet (40 mg total) by mouth at bedtime., Disp: 30 tablet, Rfl: 11 .  valsartan-hydrochlorothiazide (DIOVAN-HCT) 320-25 MG tablet, TAKE ONE TABLET BY MOUTH ONCE DAILY, Disp: 30 tablet, Rfl: 5  EXAM:  Vitals:   03/26/16 1436  BP: 120/70  Pulse: 82  Temp: 97.8 F (36.6 C)    Body mass index is 32.95 kg/m.  GENERAL: vitals reviewed and listed above, alert, oriented, appears well hydrated and in no acute distress  HEENT: atraumatic, conjunttiva clear, no obvious abnormalities on inspection of external nose and ears, normal appearance of ear canals and TMs, clear and yellow nasal congestion, mild post oropharyngeal erythema with PND, no tonsillar edema or exudate, no sinus TTP  NECK: no obvious masses on inspection  LUNGS: clear to auscultation bilaterally, no wheezes, rales or rhonchi, good air movement  CV: HRRR, no peripheral edema  MS: moves all extremities without noticeable abnormality  PSYCH: pleasant and cooperative, no obvious depression or anxiety  ASSESSMENT AND PLAN:  Discussed the following assessment and plan:  Acute upper respiratory infection  -Seems like  unfortunately he has come down with another upper respiratory infection - likely viral given the acute onset with him and his wife and the sneezing, now with improvement in sinus symptoms -Discussed symptomatic care, but did advise that he follow up promptly if he should have recurring sinus pain, fevers, malaise or worsening symptoms or if his symptoms do not resolve as expected -Patient advised to return or notify a doctor immediately if symptoms worsen or persist or new concerns arise.  Patient Instructions  INSTRUCTIONS FOR UPPER RESPIRATORY INFECTION:  -plenty of rest and fluids  -  nasal saline wash 2-3 times daily (use prepackaged nasal saline or bottled/distilled water if making your own)   -in the winter time, using a humidifier at night is helpful (please follow cleaning instructions)  -if you are taking a cough medication - use only as directed, may also try a teaspoon of honey to coat the throat and throat lozenges.  -for sore throat, salt water gargles can help  -follow up if you have fevers, persistent facial pain, tooth pain, difficulty breathing or are worsening or symptoms persist longer then expected  Upper Respiratory Infection, Adult An upper respiratory infection (URI) is also known as the common cold. It is often caused by a type of germ (virus). Colds are easily spread (contagious). You can pass it to others by kissing, coughing, sneezing, or drinking out of the same glass. Usually, you get better in 1 to 3  weeks.  However, the cough can last for even longer. HOME CARE   Only take medicine as told by your doctor. Follow instructions provided above.  Drink enough water and fluids to keep your pee (urine) clear or pale yellow.  Get plenty of rest.  Return to work when your temperature is < 100 for 24 hours or as told by your doctor. You may use a face mask and wash your hands to stop your cold from spreading. GET HELP RIGHT AWAY IF:   After the first few days, you  feel you are getting worse.  You have questions about your medicine.  You have chills, shortness of breath, or red spit (mucus).  You have pain in the face for more then 1-2 days, especially when you bend forward.  You have a fever, puffy (swollen) neck, pain when you swallow, or white spots in the back of your throat.  You have a bad headache, ear pain, sinus pain, or chest pain.  You have a high-pitched whistling sound when you breathe in and out (wheezing).  You cough up blood.  You have sore muscles or a stiff neck. MAKE SURE YOU:   Understand these instructions.  Will watch your condition.  Will get help right away if you are not doing well or get worse. Document Released: 08/26/2007 Document Revised: 06/01/2011 Document Reviewed: 06/14/2013 [facility] Patient Information 2015 [place], [place]. This information is not intended to replace advice given to you by your health care provider. Make sure you discuss any questions you have with your health care provider.    Colin Benton R., DO

## 2016-03-26 NOTE — Progress Notes (Signed)
Pre visit review using our clinic review tool, if applicable. No additional management support is needed unless otherwise documented below in the visit note. 

## 2016-03-30 ENCOUNTER — Encounter: Payer: Self-pay | Admitting: Family Medicine

## 2016-03-30 DIAGNOSIS — J301 Allergic rhinitis due to pollen: Secondary | ICD-10-CM | POA: Diagnosis not present

## 2016-03-30 DIAGNOSIS — J3089 Other allergic rhinitis: Secondary | ICD-10-CM | POA: Diagnosis not present

## 2016-03-30 DIAGNOSIS — J3081 Allergic rhinitis due to animal (cat) (dog) hair and dander: Secondary | ICD-10-CM | POA: Diagnosis not present

## 2016-03-31 DIAGNOSIS — M7061 Trochanteric bursitis, right hip: Secondary | ICD-10-CM | POA: Diagnosis not present

## 2016-03-31 DIAGNOSIS — M25551 Pain in right hip: Secondary | ICD-10-CM | POA: Diagnosis not present

## 2016-04-01 NOTE — Telephone Encounter (Signed)
Pt states he has woke up this am and he has taken a change for the worse.  Pt is coughing chest is tight, and does not feel good at all. Pt just seen 03/26/16 but seems to be getting worse. Please advise.

## 2016-04-02 ENCOUNTER — Telehealth: Payer: Self-pay | Admitting: Family Medicine

## 2016-04-02 NOTE — Telephone Encounter (Signed)
Pt states he has woke up this am and he has taken a change for the worse.  Pt is coughing chest is tight, and does not feel good at all. Pt just seen 03/26/16 but seems to be getting worse. Please advise.  Pt called to follow up again today on this note above.. I sent yesterday on his mychart message, 04/01/16.Joshua [name]   Pt coughing alot at night and some during the day. Please advise! thanks

## 2016-04-02 NOTE — Telephone Encounter (Signed)
Roselyn Reef- please call and triage- sounds like he can be seen tomorrow.

## 2016-04-02 NOTE — Telephone Encounter (Signed)
Called patient and he is scheduled for tomorrow afternoon with Dr. Yong Channel. He states he was feeling better and now he is feeling bad again

## 2016-04-03 ENCOUNTER — Encounter: Payer: Self-pay | Admitting: Family Medicine

## 2016-04-03 ENCOUNTER — Ambulatory Visit (INDEPENDENT_AMBULATORY_CARE_PROVIDER_SITE_OTHER): Payer: Medicare Other | Admitting: Family Medicine

## 2016-04-03 VITALS — BP 128/72 | HR 82 | Temp 98.5°F | Ht 66.25 in | Wt 206.0 lb

## 2016-04-03 DIAGNOSIS — J069 Acute upper respiratory infection, unspecified: Secondary | ICD-10-CM | POA: Diagnosis not present

## 2016-04-03 NOTE — Progress Notes (Signed)
Pre visit review using our clinic review tool, if applicable. No additional management support is needed unless otherwise documented below in the visit note. 

## 2016-04-03 NOTE — Progress Notes (Signed)
Subjective:  Joshua [name] is a 76 y.o. year old very pleasant male patient who presents for/with See problem oriented charting ROS- no fever, chills, nausea, vomiting. Some cough, congestion, weakness and fatigue   Past Medical History-  Patient Active Problem List   Diagnosis Date Noted  . Coronary atherosclerosis 10/12/2006    Priority: High  . Irritable bowel syndrome 10/12/2006    Priority: High  . CKD (chronic kidney disease), stage III 10/23/2013    Priority: Medium  . Neuropathy ([place]) 08/21/2013    Priority: Medium  . 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 Essential hypertension 02/21/2007    Priority: Medium  . Hyperlipidemia 10/12/2006    Priority: Medium  . DEPRESSION 10/12/2006    Priority: Medium  . Asthma 10/12/2006    Priority: Medium  . Obesity, Class I, BMI 30-34.9 10/03/2015    Priority: Low  . Allergic rhinitis 05/31/2014    Priority: Low  . Fatigue 11/09/2013    Priority: Low  . Dizziness 08/23/2013    Priority: Low  . RLS (restless legs syndrome) 01/03/2013    Priority: Low  . Low back pain radiating to left leg 10/09/2011    Priority: Low  . OSTEOARTHRITIS, LOWER LEG, LEFT 10/04/2009    Priority: Low  . TOBACCO ABUSE, HX OF 12/26/2008    Priority: Low  . ANEMIA, IRON DEFICIENCY 11/06/2008    Priority: Low  . 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 OSA (obstructive sleep apnea) 11/02/2006    Priority: Low  . GERD 10/12/2006    Priority: Low  . Hereditary and idiopathic peripheral neuropathy 09/25/2014    Medications- reviewed and updated Current Outpatient Prescriptions  Medication Sig Dispense Refill  . acetaminophen (TYLENOL) 500 MG tablet Take 1,000 mg by mouth every 6 (six) hours as needed for headache (pain). Reported on 07/02/2015    . albuterol (PROAIR HFA) 108 (90 BASE) MCG/ACT inhaler Inhale 1-2 puffs into the lungs every 6 (six) hours as needed for wheezing or shortness  of breath. 18 g 3  . aspirin EC 81 MG tablet Take 81 mg by mouth daily.    . Cyanocobalamin (VITAMIN B 12 PO) Take 800 mg by mouth daily.    [name] desloratadine (CLARINEX) 5 MG tablet Take 5 mg by mouth at bedtime.     . diazepam (VALIUM) 5 MG tablet TAKE ONE TABLET BY MOUTH AT BEDTIME AS NEEDED FOR ANXIETY 30 tablet 2  . FLOVENT HFA 110 MCG/ACT inhaler Inhale 2 puffs into the lungs 2 (two) times daily. Reported on 03/22/2015  5  . fluticasone (FLONASE) 50 MCG/ACT nasal spray Place 1 spray into both nostrils 2 (two) times daily.     . folic acid (FOLVITE) A999333 MCG tablet Take 400 mcg by mouth daily.    [name] gabapentin (NEURONTIN) 100 MG capsule Take 2 caps in AM, 3 caps at bedtime 150 capsule 11  . ketoconazole (NIZORAL) 2 % cream Apply 1 application topically daily.    [name] LAC-HYDRIN FIVE 5 % LOTN lotion APPLY TO AFFECTED AREA(S) TWICE DAILY 120 mL 2  . MYRBETRIQ 25 MG TB24 tablet     . nystatin (MYCOSTATIN/NYSTOP) powder Apply topically 4 (four) times daily.    [name] nystatin ointment (MYCOSTATIN) Apply 1 application topically 2 (two) times daily.    [name] omeprazole (PRILOSEC) 20 MG capsule TAKE ONE CAPSULE BY MOUTH ONCE DAILY AS NEEDED FOR  ACID  REFLUX/HEARTBURN 90 capsule 2  .  polyethylene glycol powder (GLYCOLAX/MIRALAX) powder Take 17 g by mouth daily. 255 g 0  . Probiotic Product (ALIGN) 4 MG CAPS Take 4 mg by mouth daily.     . simvastatin (ZOCOR) 40 MG tablet Take 1 tablet (40 mg total) by mouth at bedtime. 30 tablet 11  . valsartan-hydrochlorothiazide (DIOVAN-HCT) 320-25 MG tablet TAKE ONE TABLET BY MOUTH ONCE DAILY 30 tablet 5   No current facility-administered medications for this visit.     Objective: BP 128/72 (BP Location: Left Arm, Patient Position: Sitting, Cuff Size: Large)   Pulse 82   Temp 98.5 F (36.9 C) (Oral)   Ht 5' 6.25" (1.683 m)   Wt 206 lb (93.4 kg)   SpO2 95%   BMI 33.00 kg/m  Gen: NAD, resting comfortably TM normal, nares with some clear drainage, intermittent mild  cough, oropharynx largely normal CV: RRR no murmurs rubs or gallops Lungs: CTAB no crackles, wheeze, rhonchi Abdomen: obese Ext: no edema and no calf pain or tenderness or swelling Skin: warm, dry  Assessment/Plan:  Acute upper respiratory infection S: Patient was seen on 03/19/16 after prior being treated on 03/04/16 with augmentin which started 6 days prior. Got better then got worse-Trialed prendionse course a few days later and he states felt "mean, hungry, didn't sleep". CXR done on 03/19/16 and was normal. He was treated with azithromycin- and was improving but then after new year developed cough, sneezing, nasal congestion and was treated with supportive care. He does feel some chest tightness at night mainly. Albuterol does not help this much. Not exertional at all. Does admit to skipping PPI doses.   He feels like he has never gotten over this illness. He is somewhat open to chance that this could be different illnesses though. He is frustrated by duration of illness. Reminded him how long it took him to recover from the flu last year.  A/P: I agree with Dr. Maudie Mercury from 9 days ago- this still appears to be a URI. I suspect he contracted this  After improving but never fulling having his sinusitis resolved last month.  His lungs are clear and has not used albuterol today and albuterol has not been very helpful- doubt asthma. He does have some sinus congestion again and it is possible he has developed a new sinus infection. Already on flonase and zyrtec. Discussed redosing prednisone trial for asthma/allergies- he declines.  As never febrile, doubt the flu. If this was all a case of bronchitis- has had both augmentin and azithromycin without relief. We discussed touching base (I will talk to him by phone if not improved by next Tuesday). If he is not doing better- may repeat x-ray to rule out pneumonia. As it would be almost 6 weeks of symptoms- I did agree to trial levaquin at that point likely 5-7  days of lower dose.   In regards to his chest pain at night, I advised him to take his PPI daily instead of every other day. If he is not improving with that regimen- should return to cardiology (I advised him to see them at this time but he declines). This advisement was primarily to alleviate his anxiety about the chest tightness/pressure but I doubt this is cardiac in etiology  The duration of face-to-face time during this visit was greater than 25 minutes. Greater than 50% of this time was spent in counseling due to patient concern about prolonged illness.   Return precautions advised.  [name], MD

## 2016-04-03 NOTE — Patient Instructions (Addendum)
You have had extensive workup and extensive antibiotic and steroid use already. I suspect you had a sinus infection and have 1-2 subsequent infections with afterwards with most recent one being early January.   I think we should give this some more time. Let's talk by phone Tuesday after I finish with patients so call Tuesday morning if not doing better and I will try to call you at lunch or end of the day.   Would consider repeat chest x-ray . Would also consider course of levaquin (potent antibiotic we use in the hospital mainly and can cause tendon pain so we only want to use this if we absolutely have to)  This would cover sinuses and lungs. If not better- could refer to pulmonology.   Would also add flonase if you can tolerate it to see if there is an allergic element. Would try albuterol to see if that helps as well- perhaps we need to increase your asthma medicine even

## 2016-04-06 DIAGNOSIS — M25551 Pain in right hip: Secondary | ICD-10-CM | POA: Diagnosis not present

## 2016-04-06 DIAGNOSIS — M7071 Other bursitis of hip, right hip: Secondary | ICD-10-CM | POA: Diagnosis not present

## 2016-04-07 ENCOUNTER — Telehealth: Payer: Self-pay | Admitting: Family Medicine

## 2016-04-07 NOTE — Telephone Encounter (Signed)
FYI pt was seen on 04-03-16 and pt is calling to let md know he is feeling better

## 2016-04-07 NOTE — Telephone Encounter (Signed)
Spoke with patient who stated he is feeling much better. I did advise him if we can be of further assistance please let me know. Verbalized understanding

## 2016-04-12 ENCOUNTER — Other Ambulatory Visit: Payer: Self-pay | Admitting: Family Medicine

## 2016-04-13 DIAGNOSIS — J3089 Other allergic rhinitis: Secondary | ICD-10-CM | POA: Diagnosis not present

## 2016-04-13 DIAGNOSIS — M7071 Other bursitis of hip, right hip: Secondary | ICD-10-CM | POA: Diagnosis not present

## 2016-04-13 DIAGNOSIS — J3081 Allergic rhinitis due to animal (cat) (dog) hair and dander: Secondary | ICD-10-CM | POA: Diagnosis not present

## 2016-04-13 DIAGNOSIS — J301 Allergic rhinitis due to pollen: Secondary | ICD-10-CM | POA: Diagnosis not present

## 2016-04-13 DIAGNOSIS — M25551 Pain in right hip: Secondary | ICD-10-CM | POA: Diagnosis not present

## 2016-04-15 ENCOUNTER — Telehealth: Payer: Self-pay | Admitting: Family Medicine

## 2016-04-15 DIAGNOSIS — M7071 Other bursitis of hip, right hip: Secondary | ICD-10-CM | POA: Diagnosis not present

## 2016-04-15 DIAGNOSIS — M25551 Pain in right hip: Secondary | ICD-10-CM | POA: Diagnosis not present

## 2016-04-16 NOTE — Telephone Encounter (Signed)
Pt has been sch

## 2016-04-20 DIAGNOSIS — M7071 Other bursitis of hip, right hip: Secondary | ICD-10-CM | POA: Diagnosis not present

## 2016-04-20 DIAGNOSIS — M25551 Pain in right hip: Secondary | ICD-10-CM | POA: Diagnosis not present

## 2016-04-22 DIAGNOSIS — M25551 Pain in right hip: Secondary | ICD-10-CM | POA: Diagnosis not present

## 2016-04-22 DIAGNOSIS — M7071 Other bursitis of hip, right hip: Secondary | ICD-10-CM | POA: Diagnosis not present

## 2016-04-25 ENCOUNTER — Encounter: Payer: Self-pay | Admitting: Family Medicine

## 2016-04-27 ENCOUNTER — Ambulatory Visit: Payer: Medicare Other

## 2016-04-27 ENCOUNTER — Other Ambulatory Visit: Payer: Self-pay

## 2016-04-27 ENCOUNTER — Encounter: Payer: Self-pay | Admitting: Family Medicine

## 2016-04-27 ENCOUNTER — Telehealth: Payer: Self-pay | Admitting: Family Medicine

## 2016-04-27 ENCOUNTER — Ambulatory Visit (INDEPENDENT_AMBULATORY_CARE_PROVIDER_SITE_OTHER): Payer: Medicare Other | Admitting: Family Medicine

## 2016-04-27 VITALS — BP 114/70 | HR 90 | Temp 98.0°F | Ht 66.25 in | Wt 206.0 lb

## 2016-04-27 DIAGNOSIS — J069 Acute upper respiratory infection, unspecified: Secondary | ICD-10-CM | POA: Diagnosis not present

## 2016-04-27 DIAGNOSIS — J4541 Moderate persistent asthma with (acute) exacerbation: Secondary | ICD-10-CM | POA: Diagnosis not present

## 2016-04-27 MED ORDER — AMMONIUM LACTATE 5 % EX LOTN: 1.0000 "application " | TOPICAL_LOTION | Freq: Two times a day (BID) | CUTANEOUS | 2 refills | Status: DC

## 2016-04-27 MED ORDER — BENZONATATE 100 MG PO CAPS: 100.0000 mg | ORAL_CAPSULE | Freq: Two times a day (BID) | ORAL | 0 refills | Status: DC | PRN

## 2016-04-27 MED ORDER — PREDNISONE 20 MG PO TABS: ORAL_TABLET | ORAL | 0 refills | Status: DC

## 2016-04-27 NOTE — Progress Notes (Deleted)
Subjective:   Joshua [name] is a 76 y.o. male who presents for Medicare Annual/Subsequent preventive examination.  Medicare Annual Preventive Care Visit - Subsequent   Describes Health as poor, fair, good or great?   Last MD apt  VS reviewed;  Lipids and A1c   BMI Diet  Exercise  Dental   1.) Patient-completed HRA during the assessment today Completed during the assessment today   2.) Review of Medical History: -PMH, PSH, Family History and current specialty and care providers reviewed and updated and listed below  -  Social History   Social History  . Marital status: Married    Spouse name: N/A  . Number of children: N/A  . Years of education: N/A   Occupational History  . retired Retired   Social History Main Topics  . Smoking status: Former Smoker    Packs/day: 1.00    Years: 35.00    Types: Cigarettes    Quit date: 06/16/1992  . Smokeless tobacco: Never Used  . Alcohol use 2.4 oz/week    4 Shots of liquor per week     Comment: mixed drink  . Drug use: No  . Sexual activity: No   Other Topics Concern  . Not on file   Social History Narrative   Married 42 years in 2015. No kids (mumps at age 46)   Retired from [employer] in [employer] 2x a week   Hobbies: Chief Strategy Officer, movies and tv, staying active    Family History  Problem Relation Age of Onset  . Heart disease Mother   . Hyperlipidemia Mother   . Heart disease Father   . Hyperlipidemia Father   . Diabetes Brother     Medical issues that coincide with lifestyle and cardiac risk Family hx  Hx CABG 2001  Hyperlipidemia 10/2015 cho 147; HDL 43; LDL 76 and trig 139 Medically managed on simvastatin Glucose 102 HTn; Obesity Safety; neuropathy   3.) Review of functional ability and level of safety: See Medicare screens for hearing; vision; falls; IADLs and ADLs,   Advanced Directives   Safety information given for community, home and personal safety;   See summary of  recommendations in Patient Instructions below.  General: alert, appear well hydrated and in no acute distress   Mood stable; attentive;   See patient instructions for recommendations.  4)The following written screening schedule of preventive measures were reviewed with assessment and plan made per below, orders and patient instructions:   AAA screening discussed    Alcohol screening completed    Obesity Screening and counseling done   STI screening (Hep C if born 1943-65) offered and per pt wishes       Tobacco Screening done; u/s of the abd in 2008   Vaccines:  (PPSV23 -one dose after 64, one before if risk factors),  Prevnar 13 - one does post 99 Influenza yearly  Tetanus q 10 years or Tdap if not taken ASSESSMENT/PLAN:    Prostate cancer screening ASSESSMENT/PLAN: completed 03/2012;   Colorectal cancer screening: colonoscopy q10y or colo-guard reviewed  ASSESSMENT/PLAN:completed 11/2008 / due 11/2018  Diabetes outpatient self-management training services ASSESSMENT/PLAN:  None noted to date      Screening for glaucoma(q1y if high risk - diabetes, FH, AA and > 50 or hispanic and > 65) ASSESSMENT/PLAN:   Cardiovascular screening blood tests (lipids q5y) ASSESSMENT/PLAN: deferred to MD; cardiology as appropriate  Diabetes neg    5.) Summary: -risk factors and conditions per above assessment  were discussed and treatment, recommendations and referrals were offered per documentation above and orders and patient instructions.    Education and counseling regarding the above review of health provided with a plan for the following: -see scanned patient completed form for further details -fall prevention strategies discussed  -personal and community safety -healthy lifestyle discussed (weight loss, exercise, etc_  -importance and resources for completing advanced directives discussed -see patient instructions below for any other recommendations provided          Objective:    Vitals: There were no vitals taken for this visit.  There is no height or weight on file to calculate BMI.  Tobacco History  Smoking Status  . Former Smoker  . Packs/day: 1.00  . Years: 35.00  . Types: Cigarettes  . Quit date: 06/16/1992  Smokeless Tobacco  . Never Used     Counseling given: Not Answered   Past Medical History:  Diagnosis Date  . Acute medial meniscal tear   . Anemia   . Arthritis    "middle finger right hand; right knee; neck" (02/06/2014)  . Asthma   . 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 CAD (coronary artery disease)    CABG 2001  . Depression   . Diverticulosis   . DJD (degenerative joint disease)   . GERD (gastroesophageal reflux disease)   . History of blood transfusion 2001   "related to the bypass OR"  . History of gout   . Hyperlipidemia   . Hypertension   . IBS (irritable bowel syndrome)   . Obesity   . OSA (obstructive sleep apnea)   . Pancreatitis ~ 1980  . Pneumonia 1980's X 2  . Prostate cancer ([place]) 04/2010   Past Surgical History:  Procedure Laterality Date  . ANTERIOR CERVICAL DECOMP/DISCECTOMY FUSION  05/26/06  . BACK SURGERY    . CARDIAC CATHETERIZATION  11/20/1999  . CORONARY ARTERY BYPASS GRAFT  11/20/1999   SVG-RI1-RI2, SVG-OM, SVG-dRCA  . KNEE ARTHROSCOPY Right 1992; 04/2008  . LAPAROSCOPIC CHOLECYSTECTOMY  2007  . NASAL SEPTUM SURGERY Right 2007  . ROBOT ASSISTED LAPAROSCOPIC RADICAL PROSTATECTOMY  04/2010  . THYROIDECTOMY, PARTIAL  1980   Family History  Problem Relation Age of Onset  . Heart disease Mother   . Hyperlipidemia Mother   . Heart disease Father   . Hyperlipidemia Father   . Diabetes Brother    History  Sexual Activity  . Sexual activity: No    Outpatient Encounter Prescriptions as of 04/27/2016  Medication Sig  . acetaminophen (TYLENOL) 500 MG tablet Take 1,000 mg by mouth every 6 (six) hours as needed for headache (pain). Reported on 07/02/2015  .  albuterol (PROAIR HFA) 108 (90 BASE) MCG/ACT inhaler Inhale 1-2 puffs into the lungs every 6 (six) hours as needed for wheezing or shortness of breath.  [name] aspirin EC 81 MG tablet Take 81 mg by mouth daily.  . Cyanocobalamin (VITAMIN B 12 PO) Take 800 mg by mouth daily.  [name] desloratadine (CLARINEX) 5 MG tablet Take 5 mg by mouth at bedtime.   . diazepam (VALIUM) 5 MG tablet TAKE ONE TABLET BY MOUTH AT BEDTIME AS NEEDED FOR ANXIETY  . FLOVENT HFA 110 MCG/ACT inhaler Inhale 2 puffs into the lungs 2 (two) times daily. Reported on 03/22/2015  . fluticasone (FLONASE) 50 MCG/ACT nasal spray Place 1 spray into both nostrils 2 (two) times daily.   . folic acid (FOLVITE) A999333 MCG tablet Take 400 mcg by mouth  daily.  . gabapentin (NEURONTIN) 100 MG capsule Take 2 caps in AM, 3 caps at bedtime  . ketoconazole (NIZORAL) 2 % cream Apply 1 application topically daily.  [name] LAC-HYDRIN FIVE 5 % LOTN lotion APPLY TO AFFECTED AREA(S) TWICE DAILY  . MYRBETRIQ 25 MG TB24 tablet   . nystatin (MYCOSTATIN/NYSTOP) powder Apply topically 4 (four) times daily.  [name] nystatin ointment (MYCOSTATIN) Apply 1 application topically 2 (two) times daily.  [name] omeprazole (PRILOSEC) 20 MG capsule TAKE ONE CAPSULE BY MOUTH ONCE DAILY AS NEEDED FOR  ACID  REFLUX/HEARTBURN  . polyethylene glycol powder (GLYCOLAX/MIRALAX) powder Take 17 g by mouth daily.  . Probiotic Product (ALIGN) 4 MG CAPS Take 4 mg by mouth daily.   . simvastatin (ZOCOR) 40 MG tablet Take 1 tablet (40 mg total) by mouth at bedtime.  . valsartan-hydrochlorothiazide (DIOVAN-HCT) 320-25 MG tablet TAKE ONE TABLET BY MOUTH ONCE DAILY   No facility-administered encounter medications on file as of 04/27/2016.     Activities of Daily Living No flowsheet data found.  Patient Care Team: [name], MD as PCP - General (Family Medicine)   Assessment:    Educated regarding LDCT if applicable with a 30 year hx Also educated regarding AAA for male tobacco users Exercise  Activities and Dietary recommendations    Goals    None     Fall Risk Fall Risk  01/07/2016 10/03/2015 04/24/2014 11/09/2012 07/15/2012  Falls in the past year? No No No No No   Depression Screen PHQ 2/9 Scores 10/03/2015 03/22/2015 04/24/2014 10/19/2013  PHQ - 2 Score 0 0 0 4  PHQ- 9 Score - - - 11    Cognitive Function   Montreal Cognitive Assessment  09/25/2014  Visuospatial/ Executive (0/5) 5  Naming (0/3) 3  Attention: Read list of digits (0/2) 2  Attention: Read list of letters (0/1) 1  Attention: Serial 7 subtraction starting at 100 (0/3) 3  Language: Repeat phrase (0/2) 2  Language : Fluency (0/1) 1  Abstraction (0/2) 2  Delayed Recall (0/5) 3  Orientation (0/6) 6  Total 28  Adjusted Score (based on education) 28      Immunization History  Administered Date(s) Administered  . Influenza Split 01/08/2012, 11/12/2014  . Influenza Whole 12/08/2007, 12/26/2008, 12/22/2010  . Influenza, High Dose Seasonal PF 11/16/2015  . Influenza,inj,Quad PF,36+ Mos 11/30/2012  . Influenza-Unspecified 01/12/2014  . Pneumococcal Conjugate-13 05/31/2014  . Pneumococcal Polysaccharide-23 06/04/2006, 07/15/2012  . Td 04/23/2005  . Tdap 02/18/2016  . Zoster 01/08/2012   Screening Tests Health Maintenance  Topic Date Due  . COLONOSCOPY  12/13/2018  . TETANUS/TDAP  02/17/2026  . INFLUENZA VACCINE  Completed  . ZOSTAVAX  Completed  . PNA vac Low Risk Adult  Completed      Plan:    During the course of the visit the patient was educated and counseled about the following appropriate screening and preventive services:   Vaccines to include Pneumoccal, Influenza, Hepatitis B, Td, Zostavax, HCV  Electrocardiogram  Cardiovascular Disease  Colorectal cancer screening  Diabetes screening  Prostate Cancer Screening  Glaucoma screening  Nutrition counseling   Smoking cessation counseling  Patient Instructions (the written plan) was given to the patient.    Wynetta Fines,  RN  04/27/2016

## 2016-04-27 NOTE — Telephone Encounter (Signed)
Patient Name: Joshua [name]  DOB: 1940-12-23    Initial Comment Caller says, has a bad cough, wife has some spare cough Rx Benzonatate 100 mg perls , he would liek to take some of these, or get some of these.    Nurse Assessment  Nurse: Raphael Gibney, RN, Vanita Ingles Date/Time (Eastern Time): 04/27/2016 1:29:26 PM  Confirm and document reason for call. If symptomatic, describe symptoms. ---Caller states he has bad cough. he was prescribed steroids. He was seen by Dr. Yong Channel this am. He is not worse but forgot to ask him about something for his cough. His wife has some benzonatate perles 100 mg and he wants to know if he can take those.  Does the patient have any new or worsening symptoms? ---No  Please document clinical information provided and list any resource used. ---Advised caller that note would be sent to Dr. Yong Channel regarding taking the Benzonatate. Verbalized understanding.     Guidelines    Guideline Title Affirmed Question Affirmed Notes       Final Disposition User   Clinical Call Linden, RN, Vanita Ingles

## 2016-04-27 NOTE — Progress Notes (Signed)
Pre visit review using our clinic review tool, if applicable. No additional management support is needed unless otherwise documented below in the visit note. 

## 2016-04-27 NOTE — Telephone Encounter (Signed)
I sent in tessalon for patient. He can take the ones I sent in him.

## 2016-04-27 NOTE — Patient Instructions (Addendum)
Low dose prednisone for asthma flare up. I think this is unfortunately ANOTHER virus- likely 7-14 days.   Would advise with how bad flu season is- to stay out for at least a month or until you hear flu season dying down  For worsening symptoms (particularly fever or shortness of breath despite the prednisone- return to see [place])

## 2016-04-27 NOTE — Progress Notes (Signed)
PCP: [name], MD  Subjective:  Joshua [name] is a 76 y.o. year old very pleasant male patient who presents with Upper Respiratory infection symptoms including nasal congestion, sore throat, cough. Patient started feeling poorly on Thursday. Intense cough, no wheeze at first. Wheeze bad by Sunday. Chest feels tight when wheezing. Albuterol helps. In regards to chest tightness- not worse with exertion or better with rest. Had felt well for a week in regard to prior illness. 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 Feels only mildly SOB. He notes mild Anger on prednisone higher dose in past. Symptoms are not improving at this point.  -sick contacts/travel/risks: denies flu exposure. Did work in hospital though  ROS-denies fever, NVD, tooth pain or sinus pain  Pertinent Past Medical History-  Patient Active Problem List   Diagnosis Date Noted  . Coronary atherosclerosis 10/12/2006    Priority: High  . Irritable bowel syndrome 10/12/2006    Priority: High  . CKD (chronic kidney disease), stage III 10/23/2013    Priority: Medium  . Neuropathy ([place]) 08/21/2013    Priority: Medium  . 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 Essential hypertension 02/21/2007    Priority: Medium  . Hyperlipidemia 10/12/2006    Priority: Medium  . DEPRESSION 10/12/2006    Priority: Medium  . Asthma 10/12/2006    Priority: Medium  . Obesity, Class I, BMI 30-34.9 10/03/2015    Priority: Low  . Allergic rhinitis 05/31/2014    Priority: Low  . Fatigue 11/09/2013    Priority: Low  . Dizziness 08/23/2013    Priority: Low  . RLS (restless legs syndrome) 01/03/2013    Priority: Low  . Low back pain radiating to left leg 10/09/2011    Priority: Low  . OSTEOARTHRITIS, LOWER LEG, LEFT 10/04/2009    Priority: Low  . TOBACCO ABUSE, HX OF 12/26/2008    Priority: Low  . ANEMIA, IRON DEFICIENCY 11/06/2008    Priority: Low  . 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 OSA (obstructive sleep apnea) 11/02/2006    Priority: Low  . GERD 10/12/2006    Priority: Low  . Hereditary and idiopathic peripheral neuropathy 09/25/2014   Medications- reviewed  Current Outpatient Prescriptions  Medication Sig Dispense Refill  . acetaminophen (TYLENOL) 500 MG tablet Take 1,000 mg by mouth every 6 (six) hours as needed for headache (pain). Reported on 07/02/2015    . albuterol (PROAIR HFA) 108 (90 BASE) MCG/ACT inhaler Inhale 1-2 puffs into the lungs every 6 (six) hours as needed for wheezing or shortness of breath. 18 g 3  . aspirin EC 81 MG tablet Take 81 mg by mouth daily.    . Cyanocobalamin (VITAMIN B 12 PO) Take 800 mg by mouth daily.    [name] desloratadine (CLARINEX) 5 MG tablet Take 5 mg by mouth at bedtime.     . diazepam (VALIUM) 5 MG tablet TAKE ONE TABLET BY MOUTH AT BEDTIME AS NEEDED FOR ANXIETY 30 tablet 2  . FLOVENT HFA 110 MCG/ACT inhaler Inhale 2 puffs into the lungs 2 (two) times daily. Reported on 03/22/2015  5  . fluticasone (FLONASE) 50 MCG/ACT nasal spray Place 1 spray into both nostrils 2 (two) times daily.     . folic acid (FOLVITE) A999333 MCG tablet Take 400 mcg by mouth daily.    [name] gabapentin (NEURONTIN) 100 MG capsule Take 2 caps in AM, 3 caps at bedtime 150 capsule 11  . ketoconazole (NIZORAL) 2 %  cream Apply 1 application topically daily.    [name] LAC-HYDRIN FIVE 5 % LOTN lotion APPLY TO AFFECTED AREA(S) TWICE DAILY 120 mL 2  . MYRBETRIQ 25 MG TB24 tablet     . nystatin (MYCOSTATIN/NYSTOP) powder Apply topically 4 (four) times daily.    [name] nystatin ointment (MYCOSTATIN) Apply 1 application topically 2 (two) times daily.    [name] omeprazole (PRILOSEC) 20 MG capsule TAKE ONE CAPSULE BY MOUTH ONCE DAILY AS NEEDED FOR  ACID  REFLUX/HEARTBURN 90 capsule 2  . polyethylene glycol powder (GLYCOLAX/MIRALAX) powder Take 17 g by mouth daily. 255 g 0  . Probiotic Product (ALIGN) 4 MG CAPS Take 4 mg by mouth daily.     . simvastatin (ZOCOR) 40 MG tablet  Take 1 tablet (40 mg total) by mouth at bedtime. 30 tablet 11  . valsartan-hydrochlorothiazide (DIOVAN-HCT) 320-25 MG tablet TAKE ONE TABLET BY MOUTH ONCE DAILY 30 tablet 5  . ammonium lactate (LAC-HYDRIN FIVE) 5 % LOTN lotion Apply 1 application topically 2 (two) times daily. 340 mL 2  . benzonatate (TESSALON) 100 MG capsule Take 1 capsule (100 mg total) by mouth 2 (two) times daily as needed for cough. 20 capsule 0  . predniSONE (DELTASONE) 20 MG tablet Take 1 tablet by mouth daily for 5 days, then 1/2 tablet daily for 2 days 6 tablet 0   No current facility-administered medications for this visit.     Objective: BP 114/70 (BP Location: Left Arm, Patient Position: Sitting, Cuff Size: Large)   Pulse 90   Temp 98 F (36.7 C) (Oral)   Ht 5' 6.25" (1.683 m)   Wt 206 lb (93.4 kg)   SpO2 94%   BMI 33.00 kg/m  Gen: NAD, resting comfortably HEENT: Turbinates erythematous, TM normal, pharynx mildly erythematous with no tonsilar exudate or edema, no sinus tenderness CV: RRR no murmurs rubs or gallops Lungs: CTAB no crackles, wheeze, rhonchi Abdomen: soft/nontender/nondistended/normal bowel sounds. No rebound or guarding.  Ext: no edema Skin: warm, dry, no rash Neuro: grossly normal, moves all extremities  Assessment/Plan:  Upper Respiratory infection leading to asthma exacerbation History and exam today are suggestive of viral infection most likely due to upper respiratory infection. Has triggered asthma though so will use 7 day course of prednisone- try lower dose as some anger issues in past.   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 We discussed signs that bacterial infection may have developed particularly fever or worsening shortness of breath. Likely course of 1-2 weeks. Patient  is contagious and advised good handwashing and consideration of mask If going to be in public places.   Finally, we reviewed reasons to return to care including if symptoms worsen or persist or new concerns arise- once again particularly worsening shortness of breath or fever.  Meds ordered this encounter  Medications  . predniSONE (DELTASONE) 20 MG tablet    Sig: Take 1 tablet by mouth daily for 5 days, then 1/2 tablet daily for 2 days    Dispense:  6 tablet    Refill:  0    [name], MD

## 2016-04-28 NOTE — Telephone Encounter (Signed)
Spoke to patient who verbalized understanding that medication has been sent to the pharmacy

## 2016-04-30 ENCOUNTER — Telehealth: Payer: Self-pay | Admitting: Family Medicine

## 2016-04-30 NOTE — Telephone Encounter (Signed)
"  possibly worse" noted I am glad no wheezing in the day. I am ok if the cough is getting worse as long as no shortness of breath or wheezing. Ok to monitor through the weekend- we can reevaluate especially if fever or shortness of breath ( he should seek care immediately if these occur).   Continue rest, regular hydration.

## 2016-04-30 NOTE — Telephone Encounter (Signed)
Pt seen Monday, but feels like he is possibly worse. Severe cough, that seems to be getting worse as well, especially when he switches positions Pt states no wheezing during day. No fever. But states his "skin" hurts to touch. Please advise asap. Thanks!

## 2016-05-01 ENCOUNTER — Encounter: Payer: Self-pay | Admitting: Family Medicine

## 2016-05-01 ENCOUNTER — Ambulatory Visit (INDEPENDENT_AMBULATORY_CARE_PROVIDER_SITE_OTHER): Payer: Medicare Other | Admitting: Family Medicine

## 2016-05-01 VITALS — BP 124/78 | HR 100 | Temp 98.8°F | Ht 66.25 in | Wt 203.4 lb

## 2016-05-01 DIAGNOSIS — J988 Other specified respiratory disorders: Secondary | ICD-10-CM | POA: Diagnosis not present

## 2016-05-01 MED ORDER — AZITHROMYCIN 250 MG PO TABS: ORAL_TABLET | ORAL | 0 refills | Status: DC

## 2016-05-01 NOTE — Progress Notes (Signed)
Pre visit review using our clinic review tool, if applicable. No additional management support is needed unless otherwise documented below in the visit note. 

## 2016-05-01 NOTE — Telephone Encounter (Signed)
Pt is seeing Dr. Maudie Mercury today

## 2016-05-01 NOTE — Progress Notes (Signed)
HPI:  Cough, congestion, resp infection: -started: 8 days ago and reports no better  -symptoms:nasal congestion, PND, body aches, chills, cough, wheezing, mild SOB -denies:vomiting, hemoptysis, sinus pain, headache, rash -has tried: tessalon and prednisone from PCP -sick contacts/travel/risks: no reported flu, strep or tick exposure -feels he has the flu, feels has not improved much with the prednisone or tessalon and feels strongly that he needs a "zpack" -despite listed allergies reports he has taken zpack and done "just fine" with it -hx asthma - reports using albuterol a few times per day with illness  ROS: See pertinent positives and negatives per HPI.  Past Medical History:  Diagnosis Date  . Acute medial meniscal tear   . Anemia   . Arthritis    "middle finger right hand; right knee; neck" (02/06/2014)  . Asthma   . Bladder cancer ([place]) 04/2010   "cauterized during prostate OR"  . CAD (coronary artery disease)    CABG 2001  . Depression   . Diverticulosis   . DJD (degenerative joint disease)   . GERD (gastroesophageal reflux disease)   . History of blood transfusion 2001   "related to the bypass OR"  . History of gout   . Hyperlipidemia   . Hypertension   . IBS (irritable bowel syndrome)   . Obesity   . OSA (obstructive sleep apnea)   . Pancreatitis ~ 1980  . Pneumonia 1980's X 2  . Prostate cancer ([place]) 04/2010    Past Surgical History:  Procedure Laterality Date  . ANTERIOR CERVICAL DECOMP/DISCECTOMY FUSION  05/26/06  . BACK SURGERY    . CARDIAC CATHETERIZATION  11/20/1999  . CORONARY ARTERY BYPASS GRAFT  11/20/1999   SVG-RI1-RI2, SVG-OM, SVG-dRCA  . KNEE ARTHROSCOPY Right 1992; 04/2008  . LAPAROSCOPIC CHOLECYSTECTOMY  2007  . NASAL SEPTUM SURGERY Right 2007  . ROBOT ASSISTED LAPAROSCOPIC RADICAL PROSTATECTOMY  04/2010  . THYROIDECTOMY, PARTIAL  1980    Family History  Problem Relation Age of Onset  . Heart disease Mother   . Hyperlipidemia Mother   .  Heart disease Father   . Hyperlipidemia Father   . Diabetes Brother     Social History   Social History  . Marital status: Married    Spouse name: N/A  . Number of children: N/A  . Years of education: N/A   Occupational History  . retired Retired   Social History Main Topics  . Smoking status: Former Smoker    Packs/day: 1.00    Years: 35.00    Types: Cigarettes    Quit date: 06/16/1992  . Smokeless tobacco: Never Used  . Alcohol use 2.4 oz/week    4 Shots of liquor per week     Comment: mixed drink  . Drug use: No  . Sexual activity: No   Other Topics Concern  . None   Social History Narrative   Married 42 years in 2015. No kids (mumps at age 85)   Retired from [employer] in [employer] 2x a week   Hobbies: Chief Strategy Officer, movies and tv, staying active     Current Outpatient Prescriptions:  .  acetaminophen (TYLENOL) 500 MG tablet, Take 1,000 mg by mouth every 6 (six) hours as needed for headache (pain). Reported on 07/02/2015, Disp: , Rfl:  .  albuterol (PROAIR HFA) 108 (90 BASE) MCG/ACT inhaler, Inhale 1-2 puffs into the lungs every 6 (six) hours as needed for wheezing or shortness of breath., Disp: 18 g, Rfl: 3 .  ammonium  lactate (LAC-HYDRIN FIVE) 5 % LOTN lotion, Apply 1 application topically 2 (two) times daily., Disp: 340 mL, Rfl: 2 .  aspirin EC 81 MG tablet, Take 81 mg by mouth daily., Disp: , Rfl:  .  benzonatate (TESSALON) 100 MG capsule, Take 1 capsule (100 mg total) by mouth 2 (two) times daily as needed for cough., Disp: 20 capsule, Rfl: 0 .  Cyanocobalamin (VITAMIN B 12 PO), Take 800 mg by mouth daily., Disp: , Rfl:  .  desloratadine (CLARINEX) 5 MG tablet, Take 5 mg by mouth at bedtime. , Disp: , Rfl:  .  diazepam (VALIUM) 5 MG tablet, TAKE ONE TABLET BY MOUTH AT BEDTIME AS NEEDED FOR ANXIETY, Disp: 30 tablet, Rfl: 2 .  FLOVENT HFA 110 MCG/ACT inhaler, Inhale 2 puffs into the lungs 2 (two) times daily. Reported on 03/22/2015, Disp: , Rfl: 5 .   fluticasone (FLONASE) 50 MCG/ACT nasal spray, Place 1 spray into both nostrils 2 (two) times daily. , Disp: , Rfl:  .  folic acid (FOLVITE) A999333 MCG tablet, Take 400 mcg by mouth daily., Disp: , Rfl:  .  gabapentin (NEURONTIN) 100 MG capsule, Take 2 caps in AM, 3 caps at bedtime, Disp: 150 capsule, Rfl: 11 .  ketoconazole (NIZORAL) 2 % cream, Apply 1 application topically daily., Disp: , Rfl:  .  MYRBETRIQ 25 MG TB24 tablet, , Disp: , Rfl:  .  nystatin (MYCOSTATIN/NYSTOP) powder, Apply topically 4 (four) times daily., Disp: , Rfl:  .  nystatin ointment (MYCOSTATIN), Apply 1 application topically 2 (two) times daily., Disp: , Rfl:  .  omeprazole (PRILOSEC) 20 MG capsule, TAKE ONE CAPSULE BY MOUTH ONCE DAILY AS NEEDED FOR  ACID  REFLUX/HEARTBURN, Disp: 90 capsule, Rfl: 2 .  polyethylene glycol powder (GLYCOLAX/MIRALAX) powder, Take 17 g by mouth daily., Disp: 255 g, Rfl: 0 .  predniSONE (DELTASONE) 20 MG tablet, Take 1 tablet by mouth daily for 5 days, then 1/2 tablet daily for 2 days, Disp: 6 tablet, Rfl: 0 .  Probiotic Product (ALIGN) 4 MG CAPS, Take 4 mg by mouth daily. , Disp: , Rfl:  .  simvastatin (ZOCOR) 40 MG tablet, Take 1 tablet (40 mg total) by mouth at bedtime., Disp: 30 tablet, Rfl: 11 .  valsartan-hydrochlorothiazide (DIOVAN-HCT) 320-25 MG tablet, TAKE ONE TABLET BY MOUTH ONCE DAILY, Disp: 30 tablet, Rfl: 5 .  azithromycin (ZITHROMAX) 250 MG tablet, 2 tabs day 1, then one tab daily, Disp: 6 tablet, Rfl: 0  EXAM:  Vitals:   05/01/16 1026  BP: 124/78  Pulse: 100  Temp: 98.8 F (37.1 C)    Body mass index is 32.58 kg/m.  GENERAL: vitals reviewed and listed above, alert, oriented, appears well hydrated and in no acute distress  HEENT: atraumatic, conjunttiva clear, no obvious abnormalities on inspection of external nose and ears, normal appearance of ear canals and TMs, clear nasal congestion, mild post oropharyngeal erythema with PND, no tonsillar edema or exudate, no sinus  TTP  NECK: no obvious masses on inspection  LUNGS: clear to auscultation bilaterally, no wheezes, rales or rhonchi, good air movement  CV: HRRR, no peripheral edema  MS: moves all extremities without noticeable abnormality  PSYCH: pleasant and cooperative, no obvious depression or anxiety  ASSESSMENT AND PLAN:  Discussed the following assessment and plan:  Respiratory infection  -we discussed possible serious and likely etiologies, workup and treatment, treatment risks and return precautions - sounds like mild influenza or viral illness with acute asthma exacerbation/?CAP; advised CXR to further  eval - he declined and request we try zpack first. Out of window for treatment with tamiflu. -after this discussion, Sohan opted for addition of zpack to the prednisone - he assures me despite listed allergies that he tolerates this just fine and that most of his listed allergies are actually just mild nausea - discussed risks abx and potential for other etiologies -he agrees to seek immediate medical care if worsening, struggling to breath or not improving   He declined AVS.     There are no Patient Instructions on file for this visit.  Colin Benton R., DO

## 2016-05-06 ENCOUNTER — Ambulatory Visit: Payer: Medicare Other

## 2016-05-11 DIAGNOSIS — J301 Allergic rhinitis due to pollen: Secondary | ICD-10-CM | POA: Diagnosis not present

## 2016-05-11 DIAGNOSIS — M25551 Pain in right hip: Secondary | ICD-10-CM | POA: Diagnosis not present

## 2016-05-11 DIAGNOSIS — J3081 Allergic rhinitis due to animal (cat) (dog) hair and dander: Secondary | ICD-10-CM | POA: Diagnosis not present

## 2016-05-11 DIAGNOSIS — J3089 Other allergic rhinitis: Secondary | ICD-10-CM | POA: Diagnosis not present

## 2016-05-11 DIAGNOSIS — M7071 Other bursitis of hip, right hip: Secondary | ICD-10-CM | POA: Diagnosis not present

## 2016-05-18 DIAGNOSIS — J3089 Other allergic rhinitis: Secondary | ICD-10-CM | POA: Diagnosis not present

## 2016-05-18 DIAGNOSIS — J301 Allergic rhinitis due to pollen: Secondary | ICD-10-CM | POA: Diagnosis not present

## 2016-05-18 DIAGNOSIS — J3081 Allergic rhinitis due to animal (cat) (dog) hair and dander: Secondary | ICD-10-CM | POA: Diagnosis not present

## 2016-05-19 DIAGNOSIS — M7071 Other bursitis of hip, right hip: Secondary | ICD-10-CM | POA: Diagnosis not present

## 2016-05-19 DIAGNOSIS — M25551 Pain in right hip: Secondary | ICD-10-CM | POA: Diagnosis not present
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
## 2016-05-27 ENCOUNTER — Ambulatory Visit (INDEPENDENT_AMBULATORY_CARE_PROVIDER_SITE_OTHER): Payer: Medicare Other

## 2016-05-27 VITALS — BP 146/90 | HR 82 | Ht 66.0 in | Wt 208.0 lb

## 2016-05-27 DIAGNOSIS — M7071 Other bursitis of hip, right hip: Secondary | ICD-10-CM | POA: Diagnosis not present

## 2016-05-27 DIAGNOSIS — Z0000 Encounter for general adult medical examination without abnormal findings: Secondary | ICD-10-CM

## 2016-05-27 DIAGNOSIS — M25551 Pain in right hip: Secondary | ICD-10-CM | POA: Diagnosis not present

## 2016-05-27 NOTE — Patient Instructions (Addendum)
Joshua [name] , Thank you for taking time to come for your Medicare Wellness Visit. I appreciate your ongoing commitment to your health goals. Please review the following plan we discussed and let me know if I can assist you in the future.   These are the goals we discussed: Goals    . Exercise 150 minutes per week (moderate activity)          To get back to the fitness center  \       This is a list of the screening recommended for you and due dates:  Health Maintenance  Topic Date Due  . Colon Cancer Screening  12/13/2018  . Tetanus Vaccine  02/17/2026  . Flu Shot  Completed  . Pneumonia vaccines  Completed        Fall Prevention in the Home Falls can cause injuries. They can happen to people of all ages. There are many things you can do to make your home safe and to help prevent falls. What can I do on the outside of my home?  Regularly fix the edges of walkways and driveways and fix any cracks.  Remove anything that might make you trip as you walk through a door, such as a raised step or threshold.  Trim any bushes or trees on the path to your home.  Use bright outdoor lighting.  Clear any walking paths of anything that might make someone trip, such as rocks or tools.  Regularly check to see if handrails are loose or broken. Make sure that both sides of any steps have handrails.  Any raised decks and porches should have guardrails on the edges.  Have any leaves, snow, or ice cleared regularly.  Use sand or salt on walking paths during winter.  Clean up any spills in your garage right away. This includes oil or grease spills. What can I do in the bathroom?  Use night lights.  Install grab bars by the toilet and in the tub and shower. Do not use towel bars as grab bars.  Use non-skid mats or decals in the tub or shower.  If you need to sit down in the shower, use a plastic, non-slip stool.  Keep the floor dry. Clean up any water that spills on the floor as  soon as it happens.  Remove soap buildup in the tub or shower regularly.  Attach bath mats securely with double-sided non-slip rug tape.  Do not have throw rugs and other things on the floor that can make you trip. What can I do in the bedroom?  Use night lights.  Make sure that you have a light by your bed that is easy to reach.  Do not use any sheets or blankets that are too big for your bed. They should not hang down onto the floor.  Have a firm chair that has side arms. You can use this for support while you get dressed.  Do not have throw rugs and other things on the floor that can make you trip. What can I do in the kitchen?  Clean up any spills right away.  Avoid walking on wet floors.  Keep items that you use a lot in easy-to-reach places.  If you need to reach something above you, use a strong step stool that has a grab bar.  Keep electrical cords out of the way.  Do not use floor polish or wax that makes floors slippery. If you must use wax, use non-skid floor wax.  Do not have throw rugs and other things on the floor that can make you trip. What can I do with my stairs?  Do not leave any items on the stairs.  Make sure that there are handrails on both sides of the stairs and use them. Fix handrails that are broken or loose. Make sure that handrails are as long as the stairways.  Check any carpeting to make sure that it is firmly attached to the stairs. Fix any carpet that is loose or worn.  Avoid having throw rugs at the top or bottom of the stairs. If you do have throw rugs, attach them to the floor with carpet tape.  Make sure that you have a light switch at the top of the stairs and the bottom of the stairs. If you do not have them, ask someone to add them for you. What else can I do to help prevent falls?  Wear shoes that:  Do not have high heels.  Have rubber bottoms.  Are comfortable and fit you well.  Are closed at the toe. Do not wear  sandals.  If you use a stepladder:  Make sure that it is fully opened. Do not climb a closed stepladder.  Make sure that both sides of the stepladder are locked into place.  Ask someone to hold it for you, if possible.  Clearly mark and make sure that you can see:  Any grab bars or handrails.  First and last steps.  Where the edge of each step is.  Use tools that help you move around (mobility aids) if they are needed. These include:  Canes.  Walkers.  Scooters.  Crutches.  Turn on the lights when you go into a dark area. Replace any light bulbs as soon as they burn out.  Set up your furniture so you have a clear path. Avoid moving your furniture around.  If any of your floors are uneven, fix them.  If there are any pets around you, be aware of where they are.  Review your medicines with your doctor. Some medicines can make you feel dizzy. This can increase your chance of falling. Ask your doctor what other things that you can do to help prevent falls. This information is not intended to replace advice given to you by your health care provider. Make sure you discuss any questions you have with your health care provider. Document Released: 01/03/2009 Document Revised: 08/15/2015 Document Reviewed: 04/13/2014 Elsevier Interactive Patient Education  2017 [place] Maintenance, Male A healthy lifestyle and preventive care is important for your health and wellness. Ask your health care provider about what schedule of regular examinations is right for you. What should I know about weight and diet?  Eat a Healthy Diet  Eat plenty of vegetables, fruits, whole grains, low-fat dairy products, and lean protein.  Do not eat a lot of foods high in solid fats, added sugars, or salt. Maintain a Healthy Weight  Regular exercise can help you achieve or maintain a healthy weight. You should:  Do at least 150 minutes of exercise each week. The exercise should increase  your heart rate and make you sweat (moderate-intensity exercise).  Do strength-training exercises at least twice a week. Watch Your Levels of Cholesterol and Blood Lipids  Have your blood tested for lipids and cholesterol every 5 years starting at 74 years of age. If you are at high risk for heart disease, you should start having your blood tested when you  are 74 years old. You may need to have your cholesterol levels checked more often if:  Your lipid or cholesterol levels are high.  You are older than 74 years of age.  You are at high risk for heart disease. What should I know about cancer screening? Many types of cancers can be detected early and may often be prevented. Lung Cancer  You should be screened every year for lung cancer if:  You are a current smoker who has smoked for at least 30 years.  You are a former smoker who has quit within the past 15 years.  Talk to your health care provider about your screening options, when you should start screening, and how often you should be screened. Colorectal Cancer  Routine colorectal cancer screening usually begins at 74 years of age and should be repeated every 5-10 years until you are 74 years old. You may need to be screened more often if early forms of precancerous polyps or small growths are found. Your health care provider may recommend screening at an earlier age if you have risk factors for colon cancer.  Your health care provider may recommend using home test kits to check for hidden blood in the stool.  A small camera at the end of a tube can be used to examine your colon (sigmoidoscopy or colonoscopy). This checks for the earliest forms of colorectal cancer. Prostate and Testicular Cancer  Depending on your age and overall health, your health care provider may do certain tests to screen for prostate and testicular cancer.  Talk to your health care provider about any symptoms or concerns you have about testicular or  prostate cancer. Skin Cancer  Check your skin from head to toe regularly.  Tell your health care provider about any new moles or changes in moles, especially if:  There is a change in a mole's size, shape, or color.  You have a mole that is larger than a pencil eraser.  Always use sunscreen. Apply sunscreen liberally and repeat throughout the day.  Protect yourself by wearing long sleeves, pants, a wide-brimmed hat, and sunglasses when outside. What should I know about heart disease, diabetes, and high blood pressure?  If you are 39-5 years of age, have your blood pressure checked every 3-5 years. If you are 60 years of age or older, have your blood pressure checked every year. You should have your blood pressure measured twice-once when you are at a hospital or clinic, and once when you are not at a hospital or clinic. Record the average of the two measurements. To check your blood pressure when you are not at a hospital or clinic, you can use:  An automated blood pressure machine at a pharmacy.  A home blood pressure monitor.  Talk to your health care provider about your target blood pressure.  If you are between 31-31 years old, ask your health care provider if you should take aspirin to prevent heart disease.  Have regular diabetes screenings by checking your fasting blood sugar level.  If you are at a normal weight and have a low risk for diabetes, have this test once every three years after the age of 29.  If you are overweight and have a high risk for diabetes, consider being tested at a younger age or more often.  A one-time screening for abdominal aortic aneurysm (AAA) by ultrasound is recommended for men aged 33-75 years who are current or former smokers. What should I know about preventing infection?  Hepatitis B  If you have a higher risk for hepatitis B, you should be screened for this virus. Talk with your health care provider to find out if you are at risk for  hepatitis B infection. Hepatitis C  Blood testing is recommended for:  Everyone born from [birth year] through 1963.  Anyone with known risk factors for hepatitis C. Sexually Transmitted Diseases (STDs)  You should be screened each year for STDs including gonorrhea and chlamydia if:  You are sexually active and are younger than 74 years of age.  You are older than 74 years of age and your health care provider tells you that you are at risk for this type of infection.  Your sexual activity has changed since you were last screened and you are at an increased risk for chlamydia or gonorrhea. Ask your health care provider if you are at risk.  Talk with your health care provider about whether you are at high risk of being infected with HIV. Your health care provider may recommend a prescription medicine to help prevent HIV infection. What else can I do?  Schedule regular health, dental, and eye exams.  Stay current with your vaccines (immunizations).  Do not use any tobacco products, such as cigarettes, chewing tobacco, and e-cigarettes. If you need help quitting, ask your health care provider.  Limit alcohol intake to no more than 2 drinks per day. One drink equals 12 ounces of beer, 5 ounces of wine, or 1 ounces of hard liquor.  Do not use street drugs.  Do not share needles.  Ask your health care provider for help if you need support or information about quitting drugs.  Tell your health care provider if you often feel depressed.  Tell your health care provider if you have ever been abused or do not feel safe at home. This information is not intended to replace advice given to you by your health care provider. Make sure you discuss any questions you have with your health care provider. Document Released: 09/05/2007 Document Revised: 11/06/2015 Document Reviewed: 12/11/2014 Elsevier Interactive Patient Education  2017 [employer].

## 2016-05-27 NOTE — Progress Notes (Signed)
I have reviewed and agree with note, evaluation, plan. I had a chance to see patient today- agreed with Manuela Schwartz on waiting to go back to work- he had prolonged recovery from flu last year.   [name], MD

## 2016-06-01 DIAGNOSIS — J3089 Other allergic rhinitis: Secondary | ICD-10-CM | POA: Diagnosis not present

## 2016-06-01 DIAGNOSIS — J3081 Allergic rhinitis due to animal (cat) (dog) hair and dander: Secondary | ICD-10-CM | POA: Diagnosis not present

## 2016-06-01 DIAGNOSIS — J301 Allergic rhinitis due to pollen: Secondary | ICD-10-CM | POA: Diagnosis not present

## 2016-06-04 ENCOUNTER — Encounter: Payer: Self-pay | Admitting: Family Medicine

## 2016-06-04 ENCOUNTER — Ambulatory Visit (INDEPENDENT_AMBULATORY_CARE_PROVIDER_SITE_OTHER): Payer: Medicare Other | Admitting: Family Medicine

## 2016-06-04 DIAGNOSIS — J3081 Allergic rhinitis due to animal (cat) (dog) hair and dander: Secondary | ICD-10-CM | POA: Diagnosis not present

## 2016-06-04 DIAGNOSIS — E785 Hyperlipidemia, unspecified: Secondary | ICD-10-CM

## 2016-06-04 DIAGNOSIS — I10 Essential (primary) hypertension: Secondary | ICD-10-CM

## 2016-06-04 LAB — COMPREHENSIVE METABOLIC PANEL
ALT: 28 U/L (ref 0–53)
AST: 23 U/L (ref 0–37)
Albumin: 4.3 g/dL (ref 3.5–5.2)
Alkaline Phosphatase: 51 U/L (ref 39–117)
BUN: 18 mg/dL (ref 6–23)
CO2: 29 mEq/L (ref 19–32)
Calcium: 10.2 mg/dL (ref 8.4–10.5)
Chloride: 101 mEq/L (ref 96–112)
Creatinine, Ser: 1.37 mg/dL (ref 0.40–1.50)
GFR: 54.09 mL/min — ABNORMAL LOW (ref >60.00)
Glucose, Bld: 99 mg/dL (ref 70–99)
Potassium: 4.3 mEq/L (ref 3.5–5.1)
Sodium: 142 mEq/L (ref 135–145)
Total Bilirubin: 1 mg/dL (ref 0.2–1.2)
Total Protein: 7 g/dL (ref 6.0–8.3)

## 2016-06-04 LAB — LDL CHOLESTEROL, DIRECT: Direct LDL: 115 mg/dL

## 2016-06-04 NOTE — Progress Notes (Signed)
Pre visit review using our clinic review tool, if applicable. No additional management support is needed unless otherwise documented below in the visit note. 

## 2016-06-04 NOTE — Progress Notes (Signed)
Subjective:  Joshua [name] is a 76 y.o. year old very pleasant male patient who presents for/with See problem oriented charting ROS- No chest pain or shortness of breath. No headache or blurry vision. Fatigue from recent illness and cough have finally resolved   Past Medical History-  Patient Active Problem List   Diagnosis Date Noted  . Coronary atherosclerosis 10/12/2006    Priority: High  . Irritable bowel syndrome 10/12/2006    Priority: High  . CKD (chronic kidney disease), stage III 10/23/2013    Priority: Medium  . Neuropathy ([place]) 08/21/2013    Priority: Medium  . 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 Essential hypertension 02/21/2007    Priority: Medium  . Hyperlipidemia 10/12/2006    Priority: Medium  . DEPRESSION 10/12/2006    Priority: Medium  . Asthma 10/12/2006    Priority: Medium  . Obesity, Class I, BMI 30-34.9 10/03/2015    Priority: Low  . Allergic rhinitis 05/31/2014    Priority: Low  . Fatigue 11/09/2013    Priority: Low  . Dizziness 08/23/2013    Priority: Low  . RLS (restless legs syndrome) 01/03/2013    Priority: Low  . Low back pain radiating to left leg 10/09/2011    Priority: Low  . OSTEOARTHRITIS, LOWER LEG, LEFT 10/04/2009    Priority: Low  . TOBACCO ABUSE, HX OF 12/26/2008    Priority: Low  . ANEMIA, IRON DEFICIENCY 11/06/2008    Priority: Low  . 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 OSA (obstructive sleep apnea) 11/02/2006    Priority: Low  . GERD 10/12/2006    Priority: Low  . Hereditary and idiopathic peripheral neuropathy 09/25/2014    Medications- reviewed and updated Current Outpatient Prescriptions  Medication Sig Dispense Refill  . acetaminophen (TYLENOL) 500 MG tablet Take 1,000 mg by mouth every 6 (six) hours as needed for headache (pain). Reported on 07/02/2015    . albuterol (PROAIR HFA) 108 (90 BASE) MCG/ACT inhaler Inhale 1-2 puffs into the lungs every 6  (six) hours as needed for wheezing or shortness of breath. 18 g 3  . ammonium lactate (LAC-HYDRIN FIVE) 5 % LOTN lotion Apply 1 application topically 2 (two) times daily. 340 mL 2  . aspirin EC 81 MG tablet Take 81 mg by mouth daily.    . Cyanocobalamin (VITAMIN B 12 PO) Take 800 mg by mouth daily.    [name] desloratadine (CLARINEX) 5 MG tablet Take 5 mg by mouth at bedtime.     . diazepam (VALIUM) 5 MG tablet TAKE ONE TABLET BY MOUTH AT BEDTIME AS NEEDED FOR ANXIETY 30 tablet 2  . FLOVENT HFA 110 MCG/ACT inhaler Inhale 2 puffs into the lungs 2 (two) times daily. Reported on 03/22/2015  5  . fluticasone (FLONASE) 50 MCG/ACT nasal spray Place 1 spray into both nostrils 2 (two) times daily.     . folic acid (FOLVITE) 527 MCG tablet Take 400 mcg by mouth daily.    [name] gabapentin (NEURONTIN) 100 MG capsule Take 2 caps in AM, 3 caps at bedtime 150 capsule 11  . ketoconazole (NIZORAL) 2 % cream Apply 1 application topically daily.    [name] MYRBETRIQ 25 MG TB24 tablet     . nystatin (MYCOSTATIN/NYSTOP) powder Apply topically 4 (four) times daily.    [name] nystatin ointment (MYCOSTATIN) Apply 1 application topically 2 (two) times daily.    [name] omeprazole (PRILOSEC) 20 MG capsule TAKE ONE CAPSULE BY  MOUTH ONCE DAILY AS NEEDED FOR  ACID  REFLUX/HEARTBURN 90 capsule 2  . polyethylene glycol powder (GLYCOLAX/MIRALAX) powder Take 17 g by mouth daily. 255 g 0  . Probiotic Product (ALIGN) 4 MG CAPS Take 4 mg by mouth daily.     . simvastatin (ZOCOR) 40 MG tablet Take 1 tablet (40 mg total) by mouth at bedtime. 30 tablet 11  . valsartan-hydrochlorothiazide (DIOVAN-HCT) 320-25 MG tablet TAKE ONE TABLET BY MOUTH ONCE DAILY 30 tablet 5   No current facility-administered medications for this visit.     Objective: BP 118/78   Pulse 76   Temp 98.1 F (36.7 C) (Oral)   Ht 5\' 6"  (1.676 m)   Wt 204 lb 12.8 oz (92.9 kg)   SpO2 94%   BMI 33.06 kg/m  Gen: NAD, resting comfortably CV: RRR no murmurs rubs or gallops Lungs: CTAB  no crackles, wheeze, rhonchi Abdomen: soft/nontender/nondistended/normal bowel sounds. Obese Ext: no edema, a few varicose veins Skin: warm, dry  Assessment/Plan:  Essential hypertension S: controlled on valsartan-.hct 320-25mg   BP Readings from Last 3 Encounters:  06/04/16 118/78  05/27/16 (!) 146/90  05/01/16 124/78  A/P:Continue current medications  Hyperlipidemia S: at last visit was on simvastatin 40mg  but LDL still 76. He was increasing exercise and we opted to hold off on changing medicine A/P: will update direct ldl today  Obese- advised weight loss, mentioned last LFTs up- repeat today- likely continue statin unless sharp increase  Return in about 4 months (around 10/04/2016).  Orders Placed This Encounter  Procedures  . LDL cholesterol, direct    [facility]  . Comprehensive metabolic panel    [place]   Return precautions advised.  [name], MD

## 2016-06-04 NOTE — Patient Instructions (Signed)
Please stop by lab before you go  No changes planned unless bad cholesterol is over 70- as long as liver looks ok.

## 2016-06-04 NOTE — Assessment & Plan Note (Signed)
S: controlled on valsartan-.hct 320-25mg   BP Readings from Last 3 Encounters:  06/04/16 118/78  05/27/16 (!) 146/90  05/01/16 124/78  A/P:Continue current medications

## 2016-06-04 NOTE — Assessment & Plan Note (Signed)
S: at last visit was on simvastatin 40mg  but LDL still 76. He was increasing exercise and we opted to hold off on changing medicine A/P: will update direct ldl today

## 2016-06-05 ENCOUNTER — Other Ambulatory Visit: Payer: Self-pay

## 2016-06-05 MED ORDER — ATORVASTATIN CALCIUM 40 MG PO TABS: 40.0000 mg | ORAL_TABLET | Freq: Every day | ORAL | 3 refills | Status: DC

## 2016-06-08 ENCOUNTER — Telehealth: Payer: Self-pay | Admitting: Family Medicine

## 2016-06-08 NOTE — Telephone Encounter (Signed)
Can discuss with patient. Could do rosuvastatin 20mg  instead if he prefers

## 2016-06-08 NOTE — Telephone Encounter (Signed)
Pt states he remembers taking the  atorvastatin (LIPITOR) 40 MG tablet  Under dr Arnoldo Morale and had issues with this med. Would like a call back to discuss.

## 2016-06-09 NOTE — Telephone Encounter (Signed)
Called and left a voicemail message asking for a return phone call 

## 2016-06-10 ENCOUNTER — Other Ambulatory Visit: Payer: Self-pay

## 2016-06-10 MED ORDER — ROSUVASTATIN CALCIUM 20 MG PO TABS: 20.0000 mg | ORAL_TABLET | Freq: Every day | ORAL | 3 refills | Status: DC

## 2016-06-10 NOTE — Telephone Encounter (Signed)
Spoke with patient who verbalized understanding. I removed atorvastatin off the med list and sent in the rosuvastatin to the pharmacy. Patient is aware.

## 2016-06-10 NOTE — Telephone Encounter (Signed)
Spoke with patient who wanted to know if we could just increase the dosage of the simvastatin that he is currently taking. If not, he is fine for [place] to send in the rosuvastatin. Please advise

## 2016-06-10 NOTE — Telephone Encounter (Signed)
Simvastatin above 40mg  not generally advised. Would use the rosuvastatin instead. Please inform patient and send in. Take atorvastatin off med list as well

## 2016-06-11 DIAGNOSIS — M7022 Olecranon bursitis, left elbow: Secondary | ICD-10-CM | POA: Diagnosis not present

## 2016-06-11 DIAGNOSIS — M7061 Trochanteric bursitis, right hip: Secondary | ICD-10-CM | POA: Diagnosis not present

## 2016-06-17 DIAGNOSIS — J3089 Other allergic rhinitis: Secondary | ICD-10-CM | POA: Diagnosis not present

## 2016-06-17 DIAGNOSIS — J301 Allergic rhinitis due to pollen: Secondary | ICD-10-CM | POA: Diagnosis not present

## 2016-06-17 DIAGNOSIS — J3081 Allergic rhinitis due to animal (cat) (dog) hair and dander: Secondary | ICD-10-CM | POA: Diagnosis not present

## 2016-06-18 ENCOUNTER — Telehealth: Payer: Self-pay | Admitting: Family Medicine

## 2016-06-18 ENCOUNTER — Other Ambulatory Visit: Payer: Self-pay

## 2016-06-18 MED ORDER — SIMVASTATIN 40 MG PO TABS: 40.0000 mg | ORAL_TABLET | Freq: Every day | ORAL | 3 refills | Status: DC

## 2016-06-18 NOTE — Telephone Encounter (Signed)
Called and spoke with patient who verbalized understanding. I am sending in refills for the simvastatin 40 mg.

## 2016-06-18 NOTE — Telephone Encounter (Signed)
Patient Name: Joshua [name] DOB: [birth year] Initial Comment Caller states very tired, weak and dizzy, just started new meds Nurse Assessment Nurse: Vallery Sa, RN, [place] Date/Time (Eastern Time): 06/18/2016 10:21:25 AM Confirm and document reason for call. If symptomatic, describe symptoms. ---Caller states he developed dizziness and fatigue this morning. He started Rosuvastatin yesterday and is concerned he is having a reaction to the new medication. No severe breathing difficulty. Alert and responsive. Per [facility]: Check with your doctor immediately if any of the following side effects occur while taking rosuvastatin: unusual tiredness or weakness. Does the patient have any new or worsening symptoms? ---Yes Will a triage be completed? ---Yes Related visit to physician within the last 2 weeks? ---Yes Does the PT have any chronic conditions? (i.e. diabetes, asthma, etc.) ---Yes List chronic conditions. ---High Blood Pressure and Cholesterol, Asthma (no wheezing or breathing difficulty), Allergies, Elbow and joint problems Is this a behavioral health or substance abuse call? ---No Guidelines Guideline Title Affirmed Question Affirmed Notes Dizziness - Lightheadedness [1] MODERATE dizziness (e.g., interferes with normal activities) AND [2] has NOT been evaluated by physician for this (Exception: dizziness caused by heat exposure, sudden standing, or poor fluid intake) Final Disposition User See Physician within 24 Hours [place], RN, [place] doesn't want to be seen again and asks if the MD can give direction by phone regarding his medication, dizziness and fatigue. Called the office backline and notified Sheena. Inocencio Homes states she will follow up with MD. She will call him back. [place] notified. Encouraged to call back as needed. Referrals GO TO FACILITY REFUSED Disagree/Comply: Disagree Disagree/Comply Reason: Disagree with instructions

## 2016-06-18 NOTE — Telephone Encounter (Signed)
Pt is concerned that new onset of symptoms of weakness, dizziness and fatigue are coming from the Crestor that he started yesterday. Pt would like to know what else he can take instead.  Dr. Yong Channel - Please advise. Thanks!

## 2016-06-18 NOTE — Telephone Encounter (Signed)
Have him return to the simvastatin 40mg  for now and give [place] an update in 1 week

## 2016-06-19 ENCOUNTER — Other Ambulatory Visit: Payer: Self-pay | Admitting: Neurology

## 2016-06-19 DIAGNOSIS — G609 Hereditary and idiopathic neuropathy, unspecified: Secondary | ICD-10-CM

## 2016-06-25 DIAGNOSIS — H5203 Hypermetropia, bilateral: Secondary | ICD-10-CM | POA: Diagnosis not present

## 2016-06-25 DIAGNOSIS — H25011 Cortical age-related cataract, right eye: Secondary | ICD-10-CM | POA: Diagnosis not present

## 2016-06-25 DIAGNOSIS — H2513 Age-related nuclear cataract, bilateral: Secondary | ICD-10-CM | POA: Diagnosis not present

## 2016-07-01 DIAGNOSIS — J301 Allergic rhinitis due to pollen: Secondary | ICD-10-CM | POA: Diagnosis not present

## 2016-07-01 DIAGNOSIS — J3089 Other allergic rhinitis: Secondary | ICD-10-CM | POA: Diagnosis not present

## 2016-07-01 DIAGNOSIS — J3081 Allergic rhinitis due to animal (cat) (dog) hair and dander: Secondary | ICD-10-CM | POA: Diagnosis not present

## 2016-07-02 ENCOUNTER — Telehealth: Payer: Self-pay | Admitting: Pulmonary Disease

## 2016-07-02 DIAGNOSIS — G4733 Obstructive sleep apnea (adult) (pediatric): Secondary | ICD-10-CM

## 2016-07-02 NOTE — Telephone Encounter (Signed)
Jonni Sanger from [facility] is calling and requesting an order for nasal mask or pillow mask for this pt.  Pt was last seen 08/2015.  VS please advise. No pending appts with VS>  Thanks  Allergies  Allergen Reactions  . 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 Bromfed Nausea And Vomiting  . Clarithromycin Other (See Comments)     gastritis  . Codeine Other (See Comments)    Trouble breathing  . Doxycycline Hyclate Nausea And Vomiting  . Modafinil Other (See Comments)     anxiety-nervousness  . Promethazine Hcl Other (See Comments)     fainting  . Quinolones     Cipro  . Telithromycin Nausea And Vomiting  . Hydrocodone-Acetaminophen Rash  . Sulfonamide Derivatives Rash

## 2016-07-03 NOTE — Telephone Encounter (Signed)
lmtcb X2 for pt.  

## 2016-07-03 NOTE — Telephone Encounter (Signed)
Spoke with pt and he verified that he is using his cpap machine. Placed the order for mask fitting. Nothing further is needed.

## 2016-07-03 NOTE — Telephone Encounter (Signed)
lmomtcb x1 

## 2016-07-03 NOTE — Telephone Encounter (Signed)
Patient called back - he tried the oral appliance but it didn't work for him- he went back on cpap as of a week ago. Patient uses [facility]. Pt has scheduled appt for 10/06/16 -pr

## 2016-07-03 NOTE — Telephone Encounter (Signed)
Please confirm whether he is using CPAP still.  At last ROV he was referred for oral appliance.  If he is still using CPAP, then okay to send order for mask refitting. He will also need to be scheduled for ROV in June 2018.

## 2016-07-13 DIAGNOSIS — J301 Allergic rhinitis due to pollen: Secondary | ICD-10-CM | POA: Diagnosis not present

## 2016-07-13 DIAGNOSIS — J3081 Allergic rhinitis due to animal (cat) (dog) hair and dander: Secondary | ICD-10-CM | POA: Diagnosis not present

## 2016-07-13 DIAGNOSIS — J3089 Other allergic rhinitis: Secondary | ICD-10-CM | POA: Diagnosis not present

## 2016-07-16 ENCOUNTER — Encounter: Payer: Self-pay | Admitting: Neurology

## 2016-07-16 ENCOUNTER — Ambulatory Visit (INDEPENDENT_AMBULATORY_CARE_PROVIDER_SITE_OTHER): Payer: Medicare Other | Admitting: Neurology

## 2016-07-16 ENCOUNTER — Other Ambulatory Visit: Payer: Self-pay | Admitting: Family Medicine

## 2016-07-16 ENCOUNTER — Ambulatory Visit: Payer: Medicare Other | Admitting: Neurology

## 2016-07-16 VITALS — BP 116/68 | HR 80 | Temp 98.2°F | Ht 66.25 in | Wt 211.0 lb

## 2016-07-16 DIAGNOSIS — G609 Hereditary and idiopathic neuropathy, unspecified: Secondary | ICD-10-CM | POA: Diagnosis not present

## 2016-07-16 NOTE — Patient Instructions (Signed)
1. Increase gabapentin 100mg : take 1 capsule in AM, 4 capsules at night 2. If balance worsens, resume home balance exercises 3. Follow-up in 6 months, call for any changes
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
## 2016-07-21 ENCOUNTER — Encounter: Payer: Self-pay | Admitting: Family Medicine

## 2016-07-22 ENCOUNTER — Other Ambulatory Visit: Payer: Self-pay | Admitting: Family Medicine

## 2016-07-28 DIAGNOSIS — J3081 Allergic rhinitis due to animal (cat) (dog) hair and dander: Secondary | ICD-10-CM | POA: Diagnosis not present

## 2016-07-28 DIAGNOSIS — J3089 Other allergic rhinitis: Secondary | ICD-10-CM | POA: Diagnosis not present

## 2016-07-28 DIAGNOSIS — J301 Allergic rhinitis due to pollen: Secondary | ICD-10-CM | POA: Diagnosis not present

## 2016-07-29 DIAGNOSIS — M7022 Olecranon bursitis, left elbow: Secondary | ICD-10-CM | POA: Diagnosis not present

## 2016-08-10 DIAGNOSIS — J301 Allergic rhinitis due to pollen: Secondary | ICD-10-CM | POA: Diagnosis not present

## 2016-08-10 DIAGNOSIS — J3081 Allergic rhinitis due to animal (cat) (dog) hair and dander: Secondary | ICD-10-CM | POA: Diagnosis not present

## 2016-08-10 DIAGNOSIS — J3089 Other allergic rhinitis: Secondary | ICD-10-CM | POA: Diagnosis not present

## 2016-08-18 DIAGNOSIS — J3089 Other allergic rhinitis: Secondary | ICD-10-CM | POA: Diagnosis not present

## 2016-08-18 DIAGNOSIS — J3081 Allergic rhinitis due to animal (cat) (dog) hair and dander: Secondary | ICD-10-CM | POA: Diagnosis not present

## 2016-08-18 DIAGNOSIS — J301 Allergic rhinitis due to pollen: Secondary | ICD-10-CM | POA: Diagnosis not present

## 2016-08-24 ENCOUNTER — Ambulatory Visit (INDEPENDENT_AMBULATORY_CARE_PROVIDER_SITE_OTHER): Payer: Medicare Other | Admitting: Family Medicine

## 2016-08-24 ENCOUNTER — Encounter: Payer: Self-pay | Admitting: Family Medicine

## 2016-08-24 VITALS — BP 136/76 | HR 79 | Temp 98.6°F | Ht 66.25 in | Wt 209.4 lb

## 2016-08-24 DIAGNOSIS — J069 Acute upper respiratory infection, unspecified: Secondary | ICD-10-CM

## 2016-08-24 MED ORDER — PREDNISONE 20 MG PO TABS: ORAL_TABLET | ORAL | 0 refills | Status: DC

## 2016-08-24 NOTE — Progress Notes (Signed)
PCP: [name], MD  Subjective:  Joshua [name] is a 76 y.o. year old very pleasant male patient who presents with Upper Respiratory infection symptoms including nasal congestion,  cough mainly dry -started: 5 days ago, symptoms are worsening over last 24 hours. Has noted some clear nasal discharge as well as wheeze which is new. States with cough mildest of chest discomfort.  -previous treatments: albuterol before visit today. Sinus pressure just this AM started.  -Hx of: allergies- on flonase and clarinex  ROS-denies fever, SOB, NVD, tooth pain  Pertinent Past Medical History-  Patient Active Problem List   Diagnosis Date Noted  . Coronary atherosclerosis 10/12/2006    Priority: High  . Irritable bowel syndrome 10/12/2006    Priority: High  . CKD (chronic kidney disease), stage III 10/23/2013    Priority: Medium  . Neuropathy ([place]) 08/21/2013    Priority: Medium  . 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 Essential hypertension 02/21/2007    Priority: Medium  . Hyperlipidemia 10/12/2006    Priority: Medium  . DEPRESSION 10/12/2006    Priority: Medium  . Asthma 10/12/2006    Priority: Medium  . Obesity, Class I, BMI 30-34.9 10/03/2015    Priority: Low  . Allergic rhinitis 05/31/2014    Priority: Low  . Fatigue 11/09/2013    Priority: Low  . Dizziness 08/23/2013    Priority: Low  . RLS (restless legs syndrome) 01/03/2013    Priority: Low  . Low back pain radiating to left leg 10/09/2011    Priority: Low  . OSTEOARTHRITIS, LOWER LEG, LEFT 10/04/2009    Priority: Low  . TOBACCO ABUSE, HX OF 12/26/2008    Priority: Low  . ANEMIA, IRON DEFICIENCY 11/06/2008    Priority: Low  . 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 OSA (obstructive sleep apnea) 11/02/2006    Priority: Low  . GERD 10/12/2006    Priority: Low  . Hereditary and idiopathic peripheral neuropathy 09/25/2014     Medications- reviewed   Current Outpatient Prescriptions  Medication Sig Dispense Refill  . acetaminophen (TYLENOL) 500 MG tablet Take 1,000 mg by mouth every 6 (six) hours as needed for headache (pain). Reported on 07/02/2015    . albuterol (PROAIR HFA) 108 (90 BASE) MCG/ACT inhaler Inhale 1-2 puffs into the lungs every 6 (six) hours as needed for wheezing or shortness of breath. 18 g 3  . ammonium lactate (LAC-HYDRIN FIVE) 5 % LOTN lotion Apply 1 application topically 2 (two) times daily. 340 mL 2  . aspirin EC 81 MG tablet Take 81 mg by mouth daily.    . Cyanocobalamin (VITAMIN B 12 PO) Take 800 mg by mouth daily.    [name] desloratadine (CLARINEX) 5 MG tablet Take 5 mg by mouth at bedtime.     . diazepam (VALIUM) 5 MG tablet TAKE ONE TABLET BY MOUTH AT BEDTIME AS NEEDED 30 tablet 2  . FLOVENT HFA 110 MCG/ACT inhaler Inhale 2 puffs into the lungs 2 (two) times daily. Reported on 03/22/2015  5  . fluticasone (FLONASE) 50 MCG/ACT nasal spray Place 1 spray into both nostrils 2 (two) times daily.     . folic acid (FOLVITE) 741 MCG tablet Take 400 mcg by mouth daily.    [name] gabapentin (NEURONTIN) 100 MG capsule Take 2 caps in AM, 3 caps at bedtime 150 capsule 11  . ketoconazole (NIZORAL) 2 % cream Apply 1 application topically daily.    [name]  MYRBETRIQ 25 MG TB24 tablet     . nystatin (MYCOSTATIN/NYSTOP) powder Apply topically 4 (four) times daily.    [name] nystatin ointment (MYCOSTATIN) Apply 1 application topically 2 (two) times daily.    [name] omeprazole (PRILOSEC) 20 MG capsule TAKE ONE CAPSULE BY MOUTH ONCE DAILY AS NEEDED FOR  ACID  REFLUX/HEARTBURN 90 capsule 2  . polyethylene glycol powder (GLYCOLAX/MIRALAX) powder Take 17 g by mouth daily. 255 g 0  . Probiotic Product (ALIGN) 4 MG CAPS Take 4 mg by mouth daily.     . simvastatin (ZOCOR) 40 MG tablet Take 1 tablet (40 mg total) by mouth at bedtime. 90 tablet 3  . valsartan-hydrochlorothiazide (DIOVAN-HCT) 320-25 MG tablet TAKE ONE TABLET BY MOUTH ONCE DAILY 30 tablet 5  .  predniSONE (DELTASONE) 20 MG tablet Take 1 tablet by mouth daily for 5 days, then 1/2 tablet daily for 2 days 6 tablet 0   No current facility-administered medications for this visit.     Objective: BP 136/76 (BP Location: Left Arm, Patient Position: Sitting, Cuff Size: Large)   Pulse 79   Temp 98.6 F (37 C) (Oral)   Ht 5' 6.25" (1.683 m)   Wt 209 lb 6.4 oz (95 kg)   SpO2 94%   BMI 33.54 kg/m  Gen: NAD, resting comfortably HEENT: Turbinates erythematous, TM normal, pharynx mildly erythematous with no tonsilar exudate or edema, no sinus tenderness CV: RRR no murmurs rubs or gallops Lungs: CTAB no crackles, wheeze, rhonchi Abdomen: soft/nontender/nondistended/normal bowel sounds. No rebound or guarding.  Ext: no edema Skin: warm, dry, no rash Neuro: grossly normal, moves all extremities  Assessment/Plan:  Upper Respiratory infection History and exam today are suggestive of viral infection most likely due to upper respiratory infection. Symptomatic treatment with: cough drops.   I did tell him with his sinus pressure I was worried about sinusitis as well. Also with asthma history- could be developing asthma flare. He does not want to start now but agrees to start prednisone if not improving or if worsens over next few days. Also see avs.   We discussed that we did not find any infection that had higher probability of being bacterial such as pneumonia or strep throat. We discussed signs that bacterial infection may have developed particularly fever or shortness of breath. Likely course of 1-2 weeks. Patient is contagious and advised good handwashing and consideration of mask If going to be in public places.   Finally, we reviewed reasons to return to care including if symptoms worsen or persist or new concerns arise- once again particularly worsening shortness of breath or fever.  Meds ordered this encounter  Medications  . predniSONE (DELTASONE) 20 MG tablet    Sig: Take 1 tablet  by mouth daily for 5 days, then 1/2 tablet daily for 2 days    Dispense:  6 tablet    Refill:  0    [name], MD

## 2016-08-24 NOTE — Patient Instructions (Signed)
Summer cold  Luckily lungs are clear today- I am worried about this affecting your asthma though so I prescribed prednisone for you if not improving with 5 days or if symptoms worsen  May be getting in your sinsuses as well with pressure you had this morning- prednisone can help this as well  If not getting better despite prednisone or symptoms worsen- return to see [place] so we can reevaluate

## 2016-08-25 ENCOUNTER — Telehealth: Payer: Self-pay | Admitting: Family Medicine

## 2016-08-25 NOTE — Telephone Encounter (Signed)
Spoke with patient who verbalized understanding.

## 2016-08-25 NOTE — Telephone Encounter (Signed)
Pt was seen yesterday for uri. Pt is worst. Please advise

## 2016-08-25 NOTE — Telephone Encounter (Signed)
Go ahead and start the prednisone that he was given yesterday. Update [place] in 48 hours. If significantly short of breath or febrile- we should see him back.

## 2016-08-29 ENCOUNTER — Ambulatory Visit (INDEPENDENT_AMBULATORY_CARE_PROVIDER_SITE_OTHER): Payer: Medicare Other | Admitting: Family Medicine

## 2016-08-29 ENCOUNTER — Encounter: Payer: Self-pay | Admitting: Family Medicine

## 2016-08-29 VITALS — BP 128/72 | HR 90 | Temp 98.1°F | Ht 66.25 in | Wt 207.0 lb

## 2016-08-29 DIAGNOSIS — J181 Lobar pneumonia, unspecified organism: Secondary | ICD-10-CM

## 2016-08-29 DIAGNOSIS — J189 Pneumonia, unspecified organism: Secondary | ICD-10-CM

## 2016-08-29 MED ORDER — AMOXICILLIN-POT CLAVULANATE 875-125 MG PO TABS: 1.0000 | ORAL_TABLET | Freq: Two times a day (BID) | ORAL | 0 refills | Status: DC

## 2016-08-29 MED ORDER — CEFTRIAXONE SODIUM 1 G IJ SOLR
1.0000 g | Freq: Once | INTRAMUSCULAR | Status: AC
  Administered 2016-08-29: 1 g via INTRAMUSCULAR

## 2016-08-29 NOTE — Progress Notes (Signed)
   Subjective:    Patient ID: Joshua [name], male    DOB: 11-21-1940, 76 y.o.   MRN: [id number]  HPI Here for 10 days of chest congestion, SOB, and coughing up green sputum. No fever or chest pain. On Mucinex.    Review of Systems  Constitutional: Negative.   HENT: Negative.   Eyes: Negative.   Respiratory: Positive for cough, chest tightness, shortness of breath and wheezing.   Cardiovascular: Negative.        Objective:   Physical Exam  Constitutional: He appears well-developed and well-nourished. No distress.  Neck: No thyromegaly present.  Cardiovascular: Normal rate, regular rhythm, normal heart sounds and intact distal pulses.   Pulmonary/Chest: Effort normal. He has no wheezes.  Scattered rhonchi, there are loud rales in the right middle lung  area   Lymphadenopathy:    He has no cervical adenopathy.          Assessment & Plan:  RML pneumonia, treat with a shot of Rocephin to be followed by 10 days of Augmentin. Recheck with Dr. Yong Channel next week.  Alysia Penna, MD

## 2016-08-29 NOTE — Progress Notes (Signed)
Pre visit review using our clinic review tool, if applicable. No additional management support is needed unless otherwise documented below in the visit note. 

## 2016-09-04 ENCOUNTER — Ambulatory Visit (INDEPENDENT_AMBULATORY_CARE_PROVIDER_SITE_OTHER): Payer: Medicare Other | Admitting: Family Medicine

## 2016-09-04 ENCOUNTER — Encounter: Payer: Self-pay | Admitting: Family Medicine

## 2016-09-04 VITALS — BP 114/72 | HR 85 | Temp 98.1°F | Ht 66.25 in | Wt 206.0 lb

## 2016-09-04 DIAGNOSIS — R140 Abdominal distension (gaseous): Secondary | ICD-10-CM

## 2016-09-04 DIAGNOSIS — R51 Headache: Secondary | ICD-10-CM

## 2016-09-04 DIAGNOSIS — R519 Headache, unspecified: Secondary | ICD-10-CM

## 2016-09-04 DIAGNOSIS — J189 Pneumonia, unspecified organism: Secondary | ICD-10-CM

## 2016-09-04 DIAGNOSIS — J181 Lobar pneumonia, unspecified organism: Secondary | ICD-10-CM

## 2016-09-04 NOTE — Patient Instructions (Signed)
No changes today. You are doing much better- finish the antibiotic

## 2016-09-04 NOTE — Progress Notes (Signed)
Subjective:  Joshua [name] is a 76 y.o. year old very pleasant male patient who presents for/with See problem oriented charting ROS- no fever. No significant increase in shortness of breath from his baseline.    Past Medical History-  Patient Active Problem List   Diagnosis Date Noted  . Coronary atherosclerosis 10/12/2006    Priority: High  . Irritable bowel syndrome 10/12/2006    Priority: High  . CKD (chronic kidney disease), stage III 10/23/2013    Priority: Medium  . Neuropathy ([place]) 08/21/2013    Priority: Medium  . 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 Essential hypertension 02/21/2007    Priority: Medium  . Hyperlipidemia 10/12/2006    Priority: Medium  . DEPRESSION 10/12/2006    Priority: Medium  . Asthma 10/12/2006    Priority: Medium  . Obesity, Class I, BMI 30-34.9 10/03/2015    Priority: Low  . Allergic rhinitis 05/31/2014    Priority: Low  . Fatigue 11/09/2013    Priority: Low  . Dizziness 08/23/2013    Priority: Low  . RLS (restless legs syndrome) 01/03/2013    Priority: Low  . Low back pain radiating to left leg 10/09/2011    Priority: Low  . OSTEOARTHRITIS, LOWER LEG, LEFT 10/04/2009    Priority: Low  . TOBACCO ABUSE, HX OF 12/26/2008    Priority: Low  . ANEMIA, IRON DEFICIENCY 11/06/2008    Priority: Low  . 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 OSA (obstructive sleep apnea) 11/02/2006    Priority: Low  . GERD 10/12/2006    Priority: Low  . Hereditary and idiopathic peripheral neuropathy 09/25/2014    Medications- reviewed and updated Current Outpatient Prescriptions  Medication Sig Dispense Refill  . acetaminophen (TYLENOL) 500 MG tablet Take 1,000 mg by mouth every 6 (six) hours as needed for headache (pain). Reported on 07/02/2015    . albuterol (PROAIR HFA) 108 (90 BASE) MCG/ACT inhaler Inhale 1-2 puffs into the lungs every 6 (six) hours as needed for wheezing or shortness of  breath. 18 g 3  . ammonium lactate (LAC-HYDRIN FIVE) 5 % LOTN lotion Apply 1 application topically 2 (two) times daily. 340 mL 2  . amoxicillin-clavulanate (AUGMENTIN) 875-125 MG tablet Take 1 tablet by mouth 2 (two) times daily. 20 tablet 0  . aspirin EC 81 MG tablet Take 81 mg by mouth daily.    . Cyanocobalamin (VITAMIN B 12 PO) Take 800 mg by mouth daily.    [name] desloratadine (CLARINEX) 5 MG tablet Take 5 mg by mouth at bedtime.     . diazepam (VALIUM) 5 MG tablet TAKE ONE TABLET BY MOUTH AT BEDTIME AS NEEDED 30 tablet 2  . FLOVENT HFA 110 MCG/ACT inhaler Inhale 2 puffs into the lungs 2 (two) times daily. Reported on 03/22/2015  5  . fluticasone (FLONASE) 50 MCG/ACT nasal spray Place 1 spray into both nostrils 2 (two) times daily.     . folic acid (FOLVITE) 756 MCG tablet Take 400 mcg by mouth daily.    [name] gabapentin (NEURONTIN) 100 MG capsule Take 2 caps in AM, 3 caps at bedtime 150 capsule 11  . ketoconazole (NIZORAL) 2 % cream Apply 1 application topically daily.    [name] MYRBETRIQ 25 MG TB24 tablet     . nystatin (MYCOSTATIN/NYSTOP) powder Apply topically 4 (four) times daily.    [name] nystatin ointment (MYCOSTATIN) Apply 1 application topically 2 (two) times daily.    [name]  omeprazole (PRILOSEC) 20 MG capsule TAKE ONE CAPSULE BY MOUTH ONCE DAILY AS NEEDED FOR  ACID  REFLUX/HEARTBURN 90 capsule 2  . polyethylene glycol powder (GLYCOLAX/MIRALAX) powder Take 17 g by mouth daily. 255 g 0  . Probiotic Product (ALIGN) 4 MG CAPS Take 4 mg by mouth daily.     . simvastatin (ZOCOR) 40 MG tablet Take 1 tablet (40 mg total) by mouth at bedtime. 90 tablet 3  . valsartan-hydrochlorothiazide (DIOVAN-HCT) 320-25 MG tablet TAKE ONE TABLET BY MOUTH ONCE DAILY 30 tablet 5   No current facility-administered medications for this visit.     Objective: BP 114/72 (BP Location: Left Arm, Patient Position: Sitting, Cuff Size: Large)   Pulse 85   Temp 98.1 F (36.7 C) (Oral)   Ht 5' 6.25" (1.683 m)   Wt 206 lb (93.4  kg)   SpO2 94%   BMI 33.00 kg/m  Gen: NAD, resting comfortably CV: RRR no murmurs rubs or gallops Lungs: CTAB no crackles, wheeze, rhonchi. Prior rhonchi and RML crackles no longer present.  Abdomen: soft/nontender/nondistended/normal bowel sounds. Overweight. No signs of bloating Ext: no edema Skin: warm, dry Neuro: CN II-XII intact, sensation and reflexes normal throughout, 5/5 muscle strength in bilateral upper and lower extremities. Normal finger to nose. Normal rapid alternating movements. No pronator drift. Normal romberg. Normal gait.   Assessment/Plan:  Community acquired pneumonia of right middle lobe of lung ([place]) Bloating Acute nonintractable headache S: Patient was seen 08/24/16 with what was thought to be acute URI. Had some sinus pressure as well and we started prednisone for viral sinusitis afte rsymptoms seemed to worsen in next few days. He ultimately went ot Saturday clinic when had worsening fatigue and found to have right middle lobe pneumonia. Treated with ceftriaxone in office as well as augmentin for 10 days.   He feels much better with fatigue lifting, brown sputum changed to green or yellow sputum. He has had some bloating since starting antibiotic as well as a headache over top of his head. Last headache was yesterday and resolved with tylenol- none today A/P: Pneumonia is improved - finish antibiotics. Bloating and headache could be antibiotic side effect or related to current illness. Advised probiotics for gut which he is already taking. No headache today. We discussed if symptoms continue past a few days past antibiotic we should reevaluate (not written in avs but he understands this information and is good at following up if not improving in general). Of note, had normal/reassuring neurological exam today.   Return precautions advised.  [name], MD

## 2016-09-08 DIAGNOSIS — J3089 Other allergic rhinitis: Secondary | ICD-10-CM | POA: Diagnosis not present

## 2016-09-08 DIAGNOSIS — J3081 Allergic rhinitis due to animal (cat) (dog) hair and dander: Secondary | ICD-10-CM | POA: Diagnosis not present

## 2016-09-08 DIAGNOSIS — J301 Allergic rhinitis due to pollen: Secondary | ICD-10-CM | POA: Diagnosis not present

## 2016-09-14 DIAGNOSIS — J3081 Allergic rhinitis due to animal (cat) (dog) hair and dander: Secondary | ICD-10-CM | POA: Diagnosis not present

## 2016-09-14 DIAGNOSIS — J301 Allergic rhinitis due to pollen: Secondary | ICD-10-CM | POA: Diagnosis not present

## 2016-09-14 DIAGNOSIS — J3089 Other allergic rhinitis: Secondary | ICD-10-CM | POA: Diagnosis not present

## 2016-09-21 DIAGNOSIS — J3089 Other allergic rhinitis: Secondary | ICD-10-CM | POA: Diagnosis not present

## 2016-09-21 DIAGNOSIS — J301 Allergic rhinitis due to pollen: Secondary | ICD-10-CM | POA: Diagnosis not present

## 2016-09-21 DIAGNOSIS — J3081 Allergic rhinitis due to animal (cat) (dog) hair and dander: Secondary | ICD-10-CM | POA: Diagnosis not present

## 2016-09-22 ENCOUNTER — Ambulatory Visit (INDEPENDENT_AMBULATORY_CARE_PROVIDER_SITE_OTHER): Payer: Medicare Other | Admitting: Cardiology

## 2016-09-22 ENCOUNTER — Encounter: Payer: Self-pay | Admitting: Cardiology

## 2016-09-22 VITALS — BP 118/60 | HR 81 | Ht 66.0 in | Wt 210.0 lb

## 2016-09-22 DIAGNOSIS — I10 Essential (primary) hypertension: Secondary | ICD-10-CM

## 2016-09-22 DIAGNOSIS — R5383 Other fatigue: Secondary | ICD-10-CM

## 2016-09-22 DIAGNOSIS — E663 Overweight: Secondary | ICD-10-CM

## 2016-09-22 DIAGNOSIS — I2510 Atherosclerotic heart disease of native coronary artery without angina pectoris: Secondary | ICD-10-CM

## 2016-09-22 DIAGNOSIS — E785 Hyperlipidemia, unspecified: Secondary | ICD-10-CM | POA: Diagnosis not present

## 2016-09-22 NOTE — Progress Notes (Signed)
HPI  The patient returns for follow up of CAD.  He had a negative perfusion study in 2015. He had a rough year with flu and pneumonia.  He has had no cardiovascular complaints.  He is very fatigued.  He is doing some exercising with walking.  The patient denies any new symptoms such as chest discomfort, neck or arm discomfort. There has been no new shortness of breath, PND or orthopnea. There have been no reported palpitations, presyncope or syncope.  He does get some sharp shooting pain in his chest rarely.    This is sporadic and rare.   Allergies  Allergen Reactions  . 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 Bromfed Nausea And Vomiting  . Clarithromycin Other (See Comments)     gastritis  . Codeine Other (See Comments)    Trouble breathing  . Doxycycline Hyclate Nausea And Vomiting  . Modafinil Other (See Comments)     anxiety-nervousness  . Promethazine Hcl Other (See Comments)     fainting  . Quinolones     Cipro  . Telithromycin Nausea And Vomiting  . Hydrocodone-Acetaminophen Rash  . Sulfonamide Derivatives Rash    Current Outpatient Prescriptions  Medication Sig Dispense Refill  . acetaminophen (TYLENOL) 500 MG tablet Take 1,000 mg by mouth every 6 (six) hours as needed for headache (pain). Reported on 07/02/2015    . albuterol (PROAIR HFA) 108 (90 BASE) MCG/ACT inhaler Inhale 1-2 puffs into the lungs every 6 (six) hours as needed for wheezing or shortness of breath. 18 g 3  . ammonium lactate (LAC-HYDRIN FIVE) 5 % LOTN lotion Apply 1 application topically 2 (two) times daily. 340 mL 2  . amoxicillin-clavulanate (AUGMENTIN) 875-125 MG tablet Take 1 tablet by mouth 2 (two) times daily. 20 tablet 0  . aspirin EC 81 MG tablet Take 81 mg by mouth daily.    . Cyanocobalamin (VITAMIN B 12 PO) Take 800 mg by mouth daily.    [name] desloratadine (CLARINEX) 5 MG tablet Take 5 mg by mouth at bedtime.     . diazepam  (VALIUM) 5 MG tablet TAKE ONE TABLET BY MOUTH AT BEDTIME AS NEEDED 30 tablet 2  . FLOVENT HFA 110 MCG/ACT inhaler Inhale 2 puffs into the lungs 2 (two) times daily. Reported on 03/22/2015  5  . fluticasone (FLONASE) 50 MCG/ACT nasal spray Place 1 spray into both nostrils 2 (two) times daily.     . folic acid (FOLVITE) 027 MCG tablet Take 400 mcg by mouth daily.    [name] gabapentin (NEURONTIN) 100 MG capsule Take 2 caps in AM, 3 caps at bedtime 150 capsule 11  . ketoconazole (NIZORAL) 2 % cream Apply 1 application topically daily.    [name] MYRBETRIQ 25 MG TB24 tablet     . nystatin (MYCOSTATIN/NYSTOP) powder Apply topically 4 (four) times daily.    [name] nystatin ointment (MYCOSTATIN) Apply 1 application topically 2 (two) times daily.    [name] omeprazole (PRILOSEC) 20 MG capsule TAKE ONE CAPSULE BY MOUTH ONCE DAILY AS NEEDED FOR  ACID  REFLUX/HEARTBURN 90 capsule 2  . polyethylene glycol powder (GLYCOLAX/MIRALAX) powder Take 17 g by mouth daily. 255 g 0  . Probiotic Product (ALIGN) 4 MG CAPS Take 4 mg by mouth daily.     . simvastatin (ZOCOR) 40 MG tablet Take 1 tablet (40 mg total) by mouth at bedtime. 90 tablet 3  . valsartan-hydrochlorothiazide (DIOVAN-HCT) 320-25  MG tablet TAKE ONE TABLET BY MOUTH ONCE DAILY 30 tablet 5   No current facility-administered medications for this visit.     Past Medical History:  Diagnosis Date  . Acute medial meniscal tear   . Anemia   . Arthritis    "middle finger right hand; right knee; neck" (02/06/2014)  . Asthma   . 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 CAD (coronary artery disease)    CABG 2001  . Depression   . Diverticulosis   . DJD (degenerative joint disease)   . GERD (gastroesophageal reflux disease)   . History of blood transfusion 2001   "related to the bypass OR"  . History of gout   . Hyperlipidemia   . Hypertension   . IBS (irritable bowel syndrome)   . Obesity   . OSA (obstructive sleep apnea)   . Pancreatitis ~  1980  . Pneumonia 1980's X 2  . Prostate cancer ([place]) 04/2010    Past Surgical History:  Procedure Laterality Date  . ANTERIOR CERVICAL DECOMP/DISCECTOMY FUSION  05/26/06  . BACK SURGERY    . CARDIAC CATHETERIZATION  11/20/1999  . CORONARY ARTERY BYPASS GRAFT  11/20/1999   SVG-RI1-RI2, SVG-OM, SVG-dRCA  . KNEE ARTHROSCOPY Right 1992; 04/2008  . LAPAROSCOPIC CHOLECYSTECTOMY  2007  . NASAL SEPTUM SURGERY Right 2007  . ROBOT ASSISTED LAPAROSCOPIC RADICAL PROSTATECTOMY  04/2010  . THYROIDECTOMY, PARTIAL  1980    ROS:  ED.   Otherwise as stated in the HPI and negative for all other systems.   PHYSICAL EXAM BP 118/60   Pulse 81   Ht 5\' 6"  (1.676 m)   Wt 210 lb (95.3 kg)   BMI 33.89 kg/m   GENERAL:  Well appearing NECK:  No jugular venous distention, waveform within normal limits, carotid upstroke brisk and symmetric, no bruits, no thyromegaly LUNGS:  Clear to auscultation bilaterally CHEST:  Well healed sternotomy scar. HEART:  PMI not displaced or sustained,S1 and S2 within normal limits, no S3, no S4, no clicks, no rubs, no murmurs ABD:  Flat, positive bowel sounds normal in frequency in pitch, no bruits, no rebound, no guarding, no midline pulsatile mass, no hepatomegaly, no splenomegaly EXT:  2 plus pulses throughout, no edema, no cyanosis no clubbing  EKG:  Sinus rhythm, rate 81, axis within normal limits, intervals within normal limits, no acute ST-T wave changes.  09/23/2016   Lab Results  Component Value Date   CHOL 147 10/31/2015   TRIG 139 10/31/2015   HDL 43 10/31/2015   LDLCALC 76 10/31/2015   LDLDIRECT 115.0 06/04/2016    ASSESSMENT AND PLAN  CORONARY ARTERY DISEASE -  The patient has no new sypmtoms.  No further cardiovascular testing is indicated.  We will continue with aggressive risk reduction and meds as listed.  HTN - The blood pressure is at target.  He will continue the meds as listed.   DYSLIPIDEMIA - Direct LDL was elevated as above.  He is absolutely  intolerant of other statins or increase in the current dose.  I will send him to [facility] to consider starting PCSK9.  OVERWEIGHT - The patient understands the need to lose weight with diet and exercise. We have discussed specific strategies for this.  FATIGUE -  He is using CPAP and it is being followed.  I have asked him to check with his PCP who is to draw blood soon to discuss drawing a TSH and CBC.

## 2016-09-22 NOTE — Patient Instructions (Signed)
Your physician recommends that you schedule an appointment in with one of our pharmacists in the [facility] clinic - [place].  Dr Percival Spanish recommends that you schedule a follow-up appointment in 6 months. You will receive a reminder letter in the mail two months in advance. If you don't receive a letter, please call our office to schedule the follow-up appointment.  If you need a refill on your cardiac medications before your next appointment, please call your pharmacy.   Please ask your primary care physician to get the following blood work completed - CBC, TSH

## 2016-09-23 ENCOUNTER — Encounter: Payer: Self-pay | Admitting: Cardiology

## 2016-09-23 DIAGNOSIS — E663 Overweight: Secondary | ICD-10-CM | POA: Insufficient documentation

## 2016-09-23 DIAGNOSIS — E785 Hyperlipidemia, unspecified: Secondary | ICD-10-CM | POA: Insufficient documentation

## 2016-09-24 ENCOUNTER — Ambulatory Visit (INDEPENDENT_AMBULATORY_CARE_PROVIDER_SITE_OTHER): Payer: Medicare Other | Admitting: Pharmacist

## 2016-09-24 DIAGNOSIS — E785 Hyperlipidemia, unspecified: Secondary | ICD-10-CM | POA: Diagnosis not present

## 2016-09-24 DIAGNOSIS — I2510 Atherosclerotic heart disease of native coronary artery without angina pectoris: Secondary | ICD-10-CM | POA: Diagnosis not present

## 2016-09-24 NOTE — Progress Notes (Signed)
Patient ID: CADON RACZKA                 DOB: [birth year]                    MRN: [id number]     HPI: Joshua [name] is a 76 y.o. male patient referred to lipid clinic by [facility]. PMH is significant for CAD s/p CABG 2001, DJD, hyperlipidemia, hypertension and IBS. Patient intolerant to statins high intensity statins currently on simvastatin 40mg  daily.  Patient presents to pharmacist lipid clinic for potential initiation of PCSK9 inhibitors.   Current Medications:  Simvastatin 40mg  daily  Intolerances:  Rosuvastatin 10 mg daily - severe muscle pain, problems standing up and walking Atorvastatin 40mg  daily - severe muscle pain  Risk Factors: CAD, hypertension, hyperlipidemia  LDL goal: < 70mg /dL  Diet: no fish, very little pork, significant amount of beef and chicken, trying to reduce some fried food on diet  Exercise: used to work out 2-3x /week ; currently walking every day for aproximatly 20 minutes  Family History: mother (CABG) and father(cardiac arrest at age 32yo)  Social History: denies tobacco use, deies alcohol use  Labs: Direct LDL 115 (06/04/2016 - on simvastatin 40mg  daily)  Past Medical History:  Diagnosis Date  . Acute medial meniscal tear   . Anemia   . Arthritis    "middle finger right hand; right knee; neck" (02/06/2014)  . Asthma   . Bladder cancer ([place]) 04/2010   "cauterized during prostate OR"  . CAD (coronary artery disease)    CABG 2001  . Depression   . Diverticulosis   . DJD (degenerative joint disease)   . GERD (gastroesophageal reflux disease)   . History of blood transfusion 2001   "related to the bypass OR"  . History of gout   . Hyperlipidemia   . Hypertension   . IBS (irritable bowel syndrome)   . Obesity   . OSA (obstructive sleep apnea)   . Pancreatitis ~ 1980  . Pneumonia 1980's X 2  . Prostate cancer ([place]) 04/2010    Current Outpatient Prescriptions on File Prior to Visit  Medication Sig Dispense Refill  . acetaminophen  (TYLENOL) 500 MG tablet Take 1,000 mg by mouth every 6 (six) hours as needed for headache (pain). Reported on 07/02/2015    . albuterol (PROAIR HFA) 108 (90 BASE) MCG/ACT inhaler Inhale 1-2 puffs into the lungs every 6 (six) hours as needed for wheezing or shortness of breath. 18 g 3  . ammonium lactate (LAC-HYDRIN FIVE) 5 % LOTN lotion Apply 1 application topically 2 (two) times daily. 340 mL 2  . amoxicillin-clavulanate (AUGMENTIN) 875-125 MG tablet Take 1 tablet by mouth 2 (two) times daily. 20 tablet 0  . aspirin EC 81 MG tablet Take 81 mg by mouth daily.    . Cyanocobalamin (VITAMIN B 12 PO) Take 800 mg by mouth daily.    [name] desloratadine (CLARINEX) 5 MG tablet Take 5 mg by mouth at bedtime.     . diazepam (VALIUM) 5 MG tablet TAKE ONE TABLET BY MOUTH AT BEDTIME AS NEEDED 30 tablet 2  . FLOVENT HFA 110 MCG/ACT inhaler Inhale 2 puffs into the lungs 2 (two) times daily. Reported on 03/22/2015  5  . fluticasone (FLONASE) 50 MCG/ACT nasal spray Place 1 spray into both nostrils 2 (two) times daily.     . folic acid (FOLVITE) 128 MCG tablet Take 400 mcg by mouth daily.    [name] gabapentin (NEURONTIN)  100 MG capsule Take 2 caps in AM, 3 caps at bedtime 150 capsule 11  . ketoconazole (NIZORAL) 2 % cream Apply 1 application topically daily.    [name] MYRBETRIQ 25 MG TB24 tablet     . nystatin (MYCOSTATIN/NYSTOP) powder Apply topically 4 (four) times daily.    [name] nystatin ointment (MYCOSTATIN) Apply 1 application topically 2 (two) times daily.    [name] omeprazole (PRILOSEC) 20 MG capsule TAKE ONE CAPSULE BY MOUTH ONCE DAILY AS NEEDED FOR  ACID  REFLUX/HEARTBURN 90 capsule 2  . polyethylene glycol powder (GLYCOLAX/MIRALAX) powder Take 17 g by mouth daily. 255 g 0  . Probiotic Product (ALIGN) 4 MG CAPS Take 4 mg by mouth daily.     . simvastatin (ZOCOR) 40 MG tablet Take 1 tablet (40 mg total) by mouth at bedtime. 90 tablet 3  . valsartan-hydrochlorothiazide (DIOVAN-HCT) 320-25 MG tablet TAKE ONE TABLET BY MOUTH ONCE  DAILY 30 tablet 5   No current facility-administered medications on file prior to visit.     Allergies  Allergen Reactions  . 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 Bromfed Nausea And Vomiting  . Clarithromycin Other (See Comments)     gastritis  . Codeine Other (See Comments)    Trouble breathing  . Doxycycline Hyclate Nausea And Vomiting  . Modafinil Other (See Comments)     anxiety-nervousness  . Promethazine Hcl Other (See Comments)     fainting  . Quinolones     Cipro  . Telithromycin Nausea And Vomiting  . Hydrocodone-Acetaminophen Rash  . Sulfonamide Derivatives Rash    Hyperlipidemia: Patient direct LDL remains elevated above goal of 70mg /dL for secondary prevention after simvastatin dose increase to 40mg  daily. He is unable to tolerate rosuvastatin or atorvastatin. Ezetimibe 10mg  not expected to decrease LDL by 50%; therefore, will be unable to reach LDL goal not decrease cardiovascular risk with initiation. Patient is a good candidate for PCSK9 inhibitors.  Repatha and Praleunt use, indication, storage, administration, common side effects, monitoring and approval process discussed with this appointment. Lifestyle modifications and continue daily exercise also encouraged.   Will initiate paperwork for Repatha 140mg  SureClick and update patient as needed by phone. Plan to repeat lipid panel and LFTs after 6th injection.   Shamica Moree Rodriguez-Guzman PharmD, [place] [id number] 09/25/2016 7:31 AM

## 2016-09-24 NOTE — Patient Instructions (Signed)
Lipid clinic (Mushka Laconte/[facility]) [phone]  Cholesterol Cholesterol is a fat. Your body needs a small amount of cholesterol. Cholesterol (plaque) may build up in your blood vessels (arteries). That makes you more likely to have a heart attack or stroke. You cannot feel your cholesterol level. Having a blood test is the only way to find out if your level is high. Keep your test results. Work with your doctor to keep your cholesterol at a good level. What do the results mean?  Total cholesterol is how much cholesterol is in your blood.  LDL is bad cholesterol. This is the type that can build up. Try to have low LDL.  HDL is good cholesterol. It cleans your blood vessels and carries LDL away. Try to have high HDL.  Triglycerides are fat that the body can store or burn for energy. What are good levels of cholesterol?  Total cholesterol below 200.  LDL below 100 is good for people who have health risks. LDL below 70 is good for people who have very high risks.  HDL above 40 is good. It is best to have HDL of 60 or higher.  Triglycerides below 150. How can I lower my cholesterol? Diet Follow your diet program as told by your doctor.  Choose fish, white meat chicken, or [place] that is roasted or baked. Try not to eat red meat, fried foods, sausage, or lunch meats.  Eat lots of fresh fruits and vegetables.  Choose whole grains, beans, pasta, potatoes, and cereals.  Choose olive oil, corn oil, or canola oil. Only use small amounts.  Try not to eat butter, mayonnaise, shortening, or palm kernel oils.  Try not to eat foods with trans fats.  Choose low-fat or nonfat dairy foods. ? Drink skim or nonfat milk. ? Eat low-fat or nonfat yogurt and cheeses. ? Try not to drink whole milk or cream. ? Try not to eat ice cream, egg yolks, or full-fat cheeses.  Healthy desserts include angel food cake, ginger snaps, animal crackers, hard candy, popsicles, and low-fat or nonfat frozen yogurt.  Try not to eat pastries, cakes, pies, and cookies.  Exercise Follow your exercise program as told by your doctor.  Be more active. Try gardening, walking, and taking the stairs.  Ask your doctor about ways that you can be more active.  Medicine  Take over-the-counter and prescription medicines only as told by your doctor.  This information is not intended to replace advice given to you by your health care provider. Make sure you discuss any questions you have with your health care provider. Document Released: 06/05/2008 Document Revised: 10/09/2015 Document Reviewed: 09/19/2015 Elsevier Interactive Patient Education  2017 [employer].

## 2016-09-25 MED ORDER — EVOLOCUMAB 140 MG/ML ~~LOC~~ SOAJ: 140.0000 mg | SUBCUTANEOUS | 11 refills | Status: DC

## 2016-09-25 NOTE — Assessment & Plan Note (Signed)
Patient direct LDL remains elevated above goal of 70mg /dL for secondary prevention after simvastatin dose increase to 40mg  daily. He is unable to tolerate rosuvastatin or atorvastatin. Ezetimibe 10mg  not expected to decrease LDL by 50%; therefore, will be unable to reach LDL goal not decrease cardiovascular risk with initiation. Patient is a good candidate for PCSK9 inhibitors.  Repatha and Praleunt use, indication, storage, administration, common side effects, monitoring and approval process discussed with this appointment. Lifestyle modifications and continue daily exercise also encouraged.   Will initiate paperwork for Repatha 140mg  SureClick and update patient as needed by phone. Plan to repeat lipid panel and LFTs after 6th injection.

## 2016-09-28 ENCOUNTER — Encounter: Payer: Self-pay | Admitting: Neurology

## 2016-09-29 DIAGNOSIS — J301 Allergic rhinitis due to pollen: Secondary | ICD-10-CM | POA: Diagnosis not present

## 2016-09-29 DIAGNOSIS — J3081 Allergic rhinitis due to animal (cat) (dog) hair and dander: Secondary | ICD-10-CM | POA: Diagnosis not present

## 2016-09-29 DIAGNOSIS — J3089 Other allergic rhinitis: Secondary | ICD-10-CM | POA: Diagnosis not present

## 2016-10-01 ENCOUNTER — Encounter: Payer: Self-pay | Admitting: Neurology

## 2016-10-01 ENCOUNTER — Ambulatory Visit: Payer: Medicare Other | Admitting: Family Medicine

## 2016-10-05 ENCOUNTER — Encounter: Payer: Self-pay | Admitting: Family Medicine

## 2016-10-05 ENCOUNTER — Ambulatory Visit (INDEPENDENT_AMBULATORY_CARE_PROVIDER_SITE_OTHER): Payer: Medicare Other | Admitting: Family Medicine

## 2016-10-05 VITALS — BP 138/78 | HR 77 | Temp 98.0°F | Ht 66.0 in | Wt 211.2 lb

## 2016-10-05 DIAGNOSIS — J3081 Allergic rhinitis due to animal (cat) (dog) hair and dander: Secondary | ICD-10-CM | POA: Diagnosis not present

## 2016-10-05 DIAGNOSIS — E785 Hyperlipidemia, unspecified: Secondary | ICD-10-CM

## 2016-10-05 DIAGNOSIS — N183 Chronic kidney disease, stage 3 unspecified: Secondary | ICD-10-CM

## 2016-10-05 DIAGNOSIS — E669 Obesity, unspecified: Secondary | ICD-10-CM | POA: Diagnosis not present

## 2016-10-05 DIAGNOSIS — I10 Essential (primary) hypertension: Secondary | ICD-10-CM | POA: Diagnosis not present

## 2016-10-05 DIAGNOSIS — K146 Glossodynia: Secondary | ICD-10-CM

## 2016-10-05 DIAGNOSIS — I2510 Atherosclerotic heart disease of native coronary artery without angina pectoris: Secondary | ICD-10-CM

## 2016-10-05 DIAGNOSIS — J301 Allergic rhinitis due to pollen: Secondary | ICD-10-CM | POA: Diagnosis not present

## 2016-10-05 DIAGNOSIS — J3089 Other allergic rhinitis: Secondary | ICD-10-CM | POA: Diagnosis not present

## 2016-10-05 LAB — CBC WITH DIFFERENTIAL/PLATELET
Basophils Absolute: 0.1 10*3/uL (ref 0.0–0.1)
Basophils Relative: 1.1 % (ref 0.0–3.0)
Eosinophils Absolute: 0.3 10*3/uL (ref 0.0–0.7)
Eosinophils Relative: 4.7 % (ref 0.0–5.0)
HCT: 41.5 % (ref 39.0–52.0)
Hemoglobin: 14.6 g/dL (ref 13.0–17.0)
Lymphocytes Relative: 17.5 % (ref 12.0–46.0)
Lymphs Abs: 1.2 10*3/uL (ref 0.7–4.0)
MCHC: 35.3 g/dL (ref 30.0–36.0)
MCV: 87.5 fl (ref 78.0–100.0)
Monocytes Absolute: 0.8 10*3/uL (ref 0.1–1.0)
Monocytes Relative: 11.6 % (ref 3.0–12.0)
Neutro Abs: 4.4 10*3/uL (ref 1.4–7.7)
Neutrophils Relative %: 65.1 % (ref 43.0–77.0)
Platelets: 234 10*3/uL (ref 150.0–400.0)
RBC: 4.74 Mil/uL (ref 4.22–5.81)
RDW: 12.9 % (ref 11.5–15.5)
WBC: 6.8 10*3/uL (ref 4.0–10.5)

## 2016-10-05 LAB — COMPREHENSIVE METABOLIC PANEL
ALT: 23 U/L (ref 0–53)
AST: 19 U/L (ref 0–37)
Albumin: 4.1 g/dL (ref 3.5–5.2)
Alkaline Phosphatase: 50 U/L (ref 39–117)
BUN: 17 mg/dL (ref 6–23)
CO2: 31 mEq/L (ref 19–32)
Calcium: 9.7 mg/dL (ref 8.4–10.5)
Chloride: 93 mEq/L — ABNORMAL LOW (ref 96–112)
Creatinine, Ser: 1.17 mg/dL (ref 0.40–1.50)
GFR: 64.83 mL/min (ref >60.00)
Glucose, Bld: 89 mg/dL (ref 70–99)
Potassium: 4.1 mEq/L (ref 3.5–5.1)
Sodium: 131 mEq/L — ABNORMAL LOW (ref 135–145)
Total Bilirubin: 0.8 mg/dL (ref 0.2–1.2)
Total Protein: 6.5 g/dL (ref 6.0–8.3)

## 2016-10-05 LAB — VITAMIN B12: Vitamin B-12: 711 pg/mL (ref 211–911)

## 2016-10-05 NOTE — Addendum Note (Signed)
Addended by: [name] on: 10/05/2016 11:07 AM   Modules accepted: Orders

## 2016-10-05 NOTE — Patient Instructions (Addendum)
We will call you within a week or two about your referral to nutrition consult- if not approve. If you do not hear within 3 weeks, give [place] a call.   continue to avoid nsaids (ibuprofen, aleve, motrin), control blood pressure and work on controlling cholesterol. Trial nutrition referral for long term planning on diet for obesity and kidney disease  ______________________________________________________________________  Starting October 1st 2018, I will be transferring to our new location: [address] [facility] ([address] of [address] from [employer]) [place], [place] Phone: [phone]  I would love to have you remain my patient at this new location as long as it remains convenient for you. I am excited about the opportunity to have x-ray and sports medicine in the new building but will really miss the awesome staff and physicians at [facility]. Continue to schedule appointments at [facility] and we will automatically transfer them to the [place] location starting October 1st.

## 2016-10-05 NOTE — Assessment & Plan Note (Signed)
S: controlled on valsartan-hctz 320-25mg .  ASCVD 10 year risk calculation if age 74-79: on statin BP Readings from Last 3 Encounters:  10/05/16 138/78  09/22/16 118/60  09/04/16 114/72  A/P: We discussed blood pressure goal of <140/90. Continue current meds:  Closer to goal than usual today- needs to work on weight loss

## 2016-10-05 NOTE — Assessment & Plan Note (Signed)
S: GFR stable in high 50s.  A/P: continue to avoid nsaids, control blood pressure and work on controlling cholesterol. Trial nutrition referral for long term planning on diet for obesity and kidney disease

## 2016-10-05 NOTE — Assessment & Plan Note (Signed)
S: poorly controlled on simvastatin 40mg  with last LDL up to 115 from 76 previously. Myalgias on rosuvastatin and atorvastatin. Apparently pcsk9 inhibitors are planned if insurance covers Lab Results  Component Value Date   CHOL 147 10/31/2015   HDL 43 10/31/2015   LDLCALC 76 10/31/2015   LDLDIRECT 115.0 06/04/2016   TRIG 139 10/31/2015   CHOLHDL 3.4 10/31/2015   A/P: agree with trial of pcsk9 inhibitor if approved. Continue simvastatin. Goal LDL <70

## 2016-10-05 NOTE — Assessment & Plan Note (Signed)
Has cut down volume. Trying not to eat late. Going lighter on carbs. Doing some exercise at least 14-30 minutes daily. Still struggling to lose weight.   Trial nutrition referral. Will also list under CKD III

## 2016-10-05 NOTE — Progress Notes (Addendum)
Subjective:  Joshua [name] is a 76 y.o. year old very pleasant male patient who presents for/with See problem oriented charting ROS- No chest pain or shortness of breath. No headache or blurry vision. Some lingering fatigue after pneumonia. Slight burning of tongue and has had a few ulcers on tongue.    Past Medical History-  Patient Active Problem List   Diagnosis Date Noted  . Coronary atherosclerosis 10/12/2006    Priority: High  . Irritable bowel syndrome 10/12/2006    Priority: High  . CKD (chronic kidney disease), stage III 10/23/2013    Priority: Medium  . Neuropathy ([place]) 08/21/2013    Priority: Medium  . History of prostate cancer. Gleason 6 in 2012 04/01/2010    Priority: Medium  . Essential hypertension 02/21/2007    Priority: Medium  . Hyperlipidemia 10/12/2006    Priority: Medium  . DEPRESSION 10/12/2006    Priority: Medium  . Asthma 10/12/2006    Priority: Medium  . Obesity, Class I, BMI 30-34.9 10/03/2015    Priority: Low  . Allergic rhinitis 05/31/2014    Priority: Low  . Fatigue 11/09/2013    Priority: Low  . Dizziness 08/23/2013    Priority: Low  . RLS (restless legs syndrome) 01/03/2013    Priority: Low  . Low back pain radiating to left leg 10/09/2011    Priority: Low  . OSTEOARTHRITIS, LOWER LEG, LEFT 10/04/2009    Priority: Low  . TOBACCO ABUSE, HX OF 12/26/2008    Priority: Low  . ANEMIA, IRON DEFICIENCY 11/06/2008    Priority: Low  . 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 OSA (obstructive sleep apnea) 11/02/2006    Priority: Low  . GERD 10/12/2006    Priority: Low  . Dyslipidemia 09/23/2016  . Hereditary and idiopathic peripheral neuropathy 09/25/2014    Medications- reviewed and updated Current Outpatient Prescriptions  Medication Sig Dispense Refill  . acetaminophen (TYLENOL) 500 MG tablet Take 1,000 mg by mouth every 6 (six) hours as needed for headache (pain). Reported on 07/02/2015    . albuterol (PROAIR  HFA) 108 (90 BASE) MCG/ACT inhaler Inhale 1-2 puffs into the lungs every 6 (six) hours as needed for wheezing or shortness of breath. 18 g 3  . ammonium lactate (LAC-HYDRIN FIVE) 5 % LOTN lotion Apply 1 application topically 2 (two) times daily. 340 mL 2  . aspirin EC 81 MG tablet Take 81 mg by mouth daily.    . Cyanocobalamin (VITAMIN B 12 PO) Take 800 mg by mouth daily.    [name] desloratadine (CLARINEX) 5 MG tablet Take 5 mg by mouth at bedtime.     . diazepam (VALIUM) 5 MG tablet TAKE ONE TABLET BY MOUTH AT BEDTIME AS NEEDED 30 tablet 2  . FLOVENT HFA 110 MCG/ACT inhaler Inhale 2 puffs into the lungs 2 (two) times daily. Reported on 03/22/2015  5  . fluticasone (FLONASE) 50 MCG/ACT nasal spray Place 1 spray into both nostrils 2 (two) times daily.     . folic acid (FOLVITE) 935 MCG tablet Take 400 mcg by mouth daily.    [name] gabapentin (NEURONTIN) 100 MG capsule Take 2 caps in AM, 3 caps at bedtime 150 capsule 11  . ketoconazole (NIZORAL) 2 % cream Apply 1 application topically daily.    [name] MYRBETRIQ 25 MG TB24 tablet     . nystatin (MYCOSTATIN/NYSTOP) powder Apply topically 4 (four) times daily.    [name] nystatin ointment (MYCOSTATIN) Apply 1 application topically 2 (two) times  daily.    . omeprazole (PRILOSEC) 20 MG capsule TAKE ONE CAPSULE BY MOUTH ONCE DAILY AS NEEDED FOR  ACID  REFLUX/HEARTBURN 90 capsule 2  . polyethylene glycol powder (GLYCOLAX/MIRALAX) powder Take 17 g by mouth daily. 255 g 0  . Probiotic Product (ALIGN) 4 MG CAPS Take 4 mg by mouth daily.     . simvastatin (ZOCOR) 40 MG tablet Take 1 tablet (40 mg total) by mouth at bedtime. 90 tablet 3  . valsartan-hydrochlorothiazide (DIOVAN-HCT) 320-25 MG tablet TAKE ONE TABLET BY MOUTH ONCE DAILY 30 tablet 5  . Evolocumab (REPATHA SURECLICK) 664 MG/ML SOAJ Inject 140 mg into the skin every 14 (fourteen) days. (Patient not taking: Reported on 10/05/2016) 2 pen 11   No current facility-administered medications for this visit.      Objective: BP 138/78 (BP Location: Left Arm, Patient Position: Sitting, Cuff Size: Large)   Pulse 77   Temp 98 F (36.7 C) (Oral)   Ht 5\' 6"  (1.676 m)   Wt 211 lb 3.2 oz (95.8 kg)   SpO2 94%   BMI 34.09 kg/m  Gen: NAD, resting comfortably CV: RRR no murmurs rubs or gallops Lungs: CTAB no crackles, wheeze, rhonchi Abdomen: soft/nontender/nondistended/normal bowel sounds. No rebound or guarding.  Ext: no edema Skin: warm, dry Neuro: normal gait  Assessment/Plan:  Lingering fatigue after PNA- will update labs. Some burning tongue- check b12 but does take b12 daily  Hyperlipidemia S: poorly controlled on simvastatin 40mg  with last LDL up to 115 from 76 previously. Myalgias on rosuvastatin and atorvastatin. Apparently pcsk9 inhibitors are planned if insurance covers Lab Results  Component Value Date   CHOL 147 10/31/2015   HDL 43 10/31/2015   LDLCALC 76 10/31/2015   LDLDIRECT 115.0 06/04/2016   TRIG 139 10/31/2015   CHOLHDL 3.4 10/31/2015   A/P: agree with trial of pcsk9 inhibitor if approved. Continue simvastatin. Goal LDL <70  Essential hypertension S: controlled on valsartan-hctz 320-25mg .  ASCVD 10 year risk calculation if age 42-79: on statin BP Readings from Last 3 Encounters:  10/05/16 138/78  09/22/16 118/60  09/04/16 114/72  A/P: We discussed blood pressure goal of <140/90. Continue current meds:  Closer to goal than usual today- needs to work on weight loss  Obesity, Class I, BMI 30-34.9 Has cut down volume. Trying not to eat late. Going lighter on carbs. Doing some exercise at least 14-30 minutes daily. Still struggling to lose weight.   Trial nutrition referral. Will also list under CKD III  CKD (chronic kidney disease), stage III S: GFR stable in high 50s.  A/P: continue to avoid nsaids, control blood pressure and work on controlling cholesterol. Trial nutrition referral for long term planning on diet for obesity and kidney disease  3 months  Orders  Placed This Encounter  Procedures  . CBC with Differential/Platelet  . Comprehensive metabolic panel    [place]  . Vitamin B12  . Amb ref to Medical Nutrition Therapy-MNT    Referral Priority:   Routine    Referral Type:   Consultation    Referral Reason:   Specialty Services Required    Requested Specialty:   Nutrition    Number of Visits Requested:   1   Return precautions advised.  [name], MD

## 2016-10-06 ENCOUNTER — Other Ambulatory Visit: Payer: Self-pay

## 2016-10-06 ENCOUNTER — Ambulatory Visit: Payer: Medicare Other | Admitting: Pulmonary Disease

## 2016-10-06 ENCOUNTER — Telehealth: Payer: Self-pay | Admitting: Family Medicine

## 2016-10-06 DIAGNOSIS — E871 Hypo-osmolality and hyponatremia: Secondary | ICD-10-CM

## 2016-10-06 NOTE — Telephone Encounter (Signed)
Can you ask pharmacy if the plain valsartan 320 mg is on the recall list as well? If is is not we can call in #30 or #90 of valsartan 320mg  and #30 or #90 of hctz 25mg  for daily use

## 2016-10-06 NOTE — Telephone Encounter (Signed)
Pt is on valsartan and med has been recalled. Please advise

## 2016-10-06 NOTE — Telephone Encounter (Signed)
° ° °  Pt called pharmacy and they told him to disguard the below med. Pt will need a call back as to what his new med will be     valsartan-hydrochlorothiazide (DIOVAN-HCT) 320-25 MG tablet

## 2016-10-06 NOTE — Telephone Encounter (Signed)
Spoke with patient and advised him to call his pharmacy. Patient verbalized understanding

## 2016-10-07 ENCOUNTER — Other Ambulatory Visit: Payer: Self-pay

## 2016-10-07 ENCOUNTER — Telehealth: Payer: Self-pay

## 2016-10-07 MED ORDER — LOSARTAN POTASSIUM-HCTZ 100-25 MG PO TABS: 1.0000 | ORAL_TABLET | Freq: Every day | ORAL | 0 refills | Status: DC

## 2016-10-07 NOTE — Telephone Encounter (Signed)
Called patient and left a detailed voicemail message to let patient know that I sent in Losartan-HCTZ 100/25mg  to [facility]. I sent in a 90 day supply only with no refills. Patient is to call office and schedule an appointment in 30 days for follow up per Dr. Yong Channel.

## 2016-10-07 NOTE — Telephone Encounter (Signed)
Pt is aware nurse must call pharm first before calling in new rx

## 2016-10-08 ENCOUNTER — Ambulatory Visit (INDEPENDENT_AMBULATORY_CARE_PROVIDER_SITE_OTHER): Payer: Medicare Other | Admitting: Pulmonary Disease

## 2016-10-08 ENCOUNTER — Encounter: Payer: Self-pay | Admitting: Pulmonary Disease

## 2016-10-08 VITALS — BP 112/74 | HR 87 | Ht 66.75 in | Wt 210.6 lb

## 2016-10-08 DIAGNOSIS — I2510 Atherosclerotic heart disease of native coronary artery without angina pectoris: Secondary | ICD-10-CM | POA: Diagnosis not present

## 2016-10-08 DIAGNOSIS — G4733 Obstructive sleep apnea (adult) (pediatric): Secondary | ICD-10-CM

## 2016-10-08 NOTE — Patient Instructions (Signed)
Follow up in 1 year.

## 2016-10-08 NOTE — Telephone Encounter (Signed)
ERROR

## 2016-10-08 NOTE — Progress Notes (Signed)
Current Outpatient Prescriptions on File Prior to Visit  Medication Sig  . acetaminophen (TYLENOL) 500 MG tablet Take 1,000 mg by mouth every 6 (six) hours as needed for headache (pain). Reported on 07/02/2015  . albuterol (PROAIR HFA) 108 (90 BASE) MCG/ACT inhaler Inhale 1-2 puffs into the lungs every 6 (six) hours as needed for wheezing or shortness of breath.  [name] ammonium lactate (LAC-HYDRIN FIVE) 5 % LOTN lotion Apply 1 application topically 2 (two) times daily.  [name] aspirin EC 81 MG tablet Take 81 mg by mouth daily.  . Cyanocobalamin (VITAMIN B 12 PO) Take 800 mg by mouth daily.  [name] desloratadine (CLARINEX) 5 MG tablet Take 5 mg by mouth at bedtime.   . diazepam (VALIUM) 5 MG tablet TAKE ONE TABLET BY MOUTH AT BEDTIME AS NEEDED  . Evolocumab (REPATHA SURECLICK) 924 MG/ML SOAJ Inject 140 mg into the skin every 14 (fourteen) days.  [name] FLOVENT HFA 110 MCG/ACT inhaler Inhale 2 puffs into the lungs 2 (two) times daily. Reported on 03/22/2015  . fluticasone (FLONASE) 50 MCG/ACT nasal spray Place 1 spray into both nostrils 2 (two) times daily.   . folic acid (FOLVITE) 268 MCG tablet Take 400 mcg by mouth daily.  [name] gabapentin (NEURONTIN) 100 MG capsule Take 2 caps in AM, 3 caps at bedtime  . ketoconazole (NIZORAL) 2 % cream Apply 1 application topically daily.  [name] losartan-hydrochlorothiazide (HYZAAR) 100-25 MG tablet Take 1 tablet by mouth daily.  [name] MYRBETRIQ 25 MG TB24 tablet   . nystatin (MYCOSTATIN/NYSTOP) powder Apply topically 4 (four) times daily.  [name] nystatin ointment (MYCOSTATIN) Apply 1 application topically 2 (two) times daily.  [name] omeprazole (PRILOSEC) 20 MG capsule TAKE ONE CAPSULE BY MOUTH ONCE DAILY AS NEEDED FOR  ACID  REFLUX/HEARTBURN  . polyethylene glycol powder (GLYCOLAX/MIRALAX) powder Take 17 g by mouth daily.  . Probiotic Product (ALIGN) 4 MG CAPS Take 4 mg by mouth daily.   . simvastatin (ZOCOR) 40 MG tablet Take 1 tablet (40 mg total) by mouth at bedtime.   No current  facility-administered medications on file prior to visit.      Chief Complaint  Patient presents with  . Follow-up    Wears CPAP nightly. Denies problems with pressure. Pt having issues with mask. DME: [employer]     Tests Echo 05/10/12 >> EF 60 to 65%, mild LVH PSG 12/25/13 >> AHI 13 Auto CPAP 08/12/15 to 09/10/15 >> used on 29 of 30 nights with average 6 hrs 31 min.  Average AHI 0.9 with median CPAP 7 and 95 th percentile CPAP 10 cm H2O  Past medical hx Asthma, CAD s/p CABG, HTN, HLD, Depression, IBS, Prostate cancer 2012, Bladder cancer 2012, Pneumonia, Gout  Past surgical hx, Allergies, Family hx, Social hx all reviewed.  Vital Signs BP 112/74 (BP Location: Left Arm, Cuff Size: Normal)   Pulse 87   Ht 5' 6.75" (1.695 m)   Wt 210 lb 9.6 oz (95.5 kg)   SpO2 95%   BMI 33.23 kg/m   History of Present Illness Joshua [name] is a 76 y.o. male with obstructive sleep apnea.  He uses CPAP nightly.  He has trouble with skin irritation from his full face mask.  He will be getting a new mask later this month.  Otherwise no issues with CPAP and this helps.  He is followed by Dr. Orvil Feil for allergies and asthma.  He is concerned about increased price of flovent and wanted to know if there is an alternative  medication.  Physical Exam  General - pleasant Eyes - pupils reactive ENT - no sinus tenderness, no oral exudate, no LAN, MP 3 Cardiac - regular, no murmur Chest - no wheeze, rales Abd - soft, non tender Ext - no edema Skin - no rashes Neuro - normal strength Psych - normal mood   Assessment/Plan  Obstructive sleep apnea. - he is compliant with CPAP and reports benefit - continue auto CPAP range 5 to 12 cm H2O - he will call if he needs help getting new mask  Allergic asthma. - discussed options to help with cost of inhalers - advised him to d/w Dr. Orvil Feil about whether he could change to alternate inhaler   Patient Instructions  Follow up in 1 year    Chesley Mires, MD [place] Pager:  [phone] 10/08/2016, 11:07 AM

## 2016-10-12 ENCOUNTER — Telehealth: Payer: Self-pay | Admitting: Pharmacist

## 2016-10-12 NOTE — Telephone Encounter (Signed)
Repatha copy is $419.12 ; patient has the [facility] paperwork.  Instructed to fill out patient parts and bring paperwork back to the office

## 2016-10-14 ENCOUNTER — Other Ambulatory Visit (INDEPENDENT_AMBULATORY_CARE_PROVIDER_SITE_OTHER): Payer: Medicare Other

## 2016-10-14 ENCOUNTER — Other Ambulatory Visit: Payer: Medicare Other

## 2016-10-14 DIAGNOSIS — E871 Hypo-osmolality and hyponatremia: Secondary | ICD-10-CM

## 2016-10-14 LAB — BASIC METABOLIC PANEL
BUN: 14 mg/dL (ref 6–23)
CO2: 32 mEq/L (ref 19–32)
Calcium: 9.5 mg/dL (ref 8.4–10.5)
Chloride: 99 mEq/L (ref 96–112)
Creatinine, Ser: 1.35 mg/dL (ref 0.40–1.50)
GFR: 54.96 mL/min — ABNORMAL LOW (ref >60.00)
Glucose, Bld: 92 mg/dL (ref 70–99)
Potassium: 3.7 mEq/L (ref 3.5–5.1)
Sodium: 135 mEq/L (ref 135–145)

## 2016-10-15 ENCOUNTER — Telehealth: Payer: Self-pay | Admitting: Family Medicine

## 2016-10-15 ENCOUNTER — Telehealth: Payer: Self-pay | Admitting: Pharmacist

## 2016-10-15 ENCOUNTER — Telehealth: Payer: Self-pay

## 2016-10-15 ENCOUNTER — Other Ambulatory Visit: Payer: Self-pay

## 2016-10-15 MED ORDER — IRBESARTAN 300 MG PO TABS: 300.0000 mg | ORAL_TABLET | Freq: Every day | ORAL | 1 refills | Status: DC

## 2016-10-15 MED ORDER — HYDROCHLOROTHIAZIDE 25 MG PO TABS: 25.0000 mg | ORAL_TABLET | Freq: Every day | ORAL | 1 refills | Status: DC

## 2016-10-15 NOTE — Telephone Encounter (Signed)
Spoke with patient and let him know that I sent in the medications as instructed by Dr. Yong Channel

## 2016-10-15 NOTE — Telephone Encounter (Signed)
Medication Samples have been provided to the patient.  Drug name: Repatha  SureClick        Strength: 140mg        Qty: 1  LOT: [id number] A Exp.Date: 08/2018   Instructions: Self administration while in clinic to assess technique.  The patient has been instructed regarding the correct time, dose, and frequency of taking this medication, including desired effects and most common side effects.   Anabelen Kaminsky N Rodriguez-Guzman 9:54 AM 10/15/2016

## 2016-10-15 NOTE — Telephone Encounter (Signed)
Please send in Irbesartan 300mg  daily #30 with 5 refills plus HCTZ 25mg  daily #30 with 5 refills. The note said both from cardiology and this makes more sense due to poor control. See other staff message

## 2016-10-15 NOTE — Telephone Encounter (Addendum)
Pt went to lipid clinic today and they gave pt suggestions for alternatives to the valsartan  HZCT by itself and Irbesartan 300 mg  Pt states the cardiologist there have chosen these to meds to switch their pt's to and are supposed to be sending over information to Dr Yong Channel.    [place], [place].

## 2016-10-15 NOTE — Telephone Encounter (Signed)
-----   Message from [name], MD sent at 10/15/2016 12:34 PM EDT ----- Regarding: RE: Valsartan recall Thanks fo reaching out.   A few questions 1. Did you already make this change or are you requesting that I order it? Roselyn Reef- you can order this if they are asking [place] to order it.  2. Can you educate me on why the change is needed? I cannot understand this comment well "Noted he double him Hyzaar dose last night." I also didn't understand reference to heart failure in this context.   Thanks! [name]  ----- Message ----- From: Harrington Challenger, [facility] Sent: 10/15/2016  10:41 AM To: [name] Rouge, MD Subject: Valsartan recall                               Good morning Dr Yong Channel,  Mr Damiano stopped by our office ([facility] [place]) today follow up on Reptha approval. Mentioned recent problems with BP control after change of valsartan 325/25 to losartan 100/25 due to recall. Noted he double him Hyzaar dose last night.  No hx of HF noted in chart. Will recommend change losartan/HCTZ to Irbesartan 300mg  plus HCTZ 25mg  daily. Patient to monitor BP 2-3 per week minimum for 3 weeks to assess response.  Raquel Rodriguez-Guzman PharmD, BCPS, [place] [id number] 10/15/2016 10:47 AM

## 2016-10-15 NOTE — Telephone Encounter (Signed)
Spoke with patient earlier and notified him that I sent to pharmacy the following:  Please send in Irbesartan 300mg  daily #30 with 5 refills plus HCTZ 25mg  daily #30 with 5 refills. The note said both from cardiology and this makes more sense due to poor control. See other staff message  He verbalized understanding

## 2016-10-15 NOTE — Telephone Encounter (Signed)
Pt states since he was switched to the losartan-hydrochlorothiazide (HYZAAR) 100-25 MG tablet  (due to valsartan recall) His BP has been running high.    Pt took 2 tabs yesterday and his bp was approximately 160/80-90  Pt did not take any today, took a walk this am and his bp was 141/71.  Pt thinks this med is not working. Please advise

## 2016-10-19 DIAGNOSIS — K136 Irritative hyperplasia of oral mucosa: Secondary | ICD-10-CM | POA: Diagnosis not present

## 2016-10-20 DIAGNOSIS — J301 Allergic rhinitis due to pollen: Secondary | ICD-10-CM | POA: Diagnosis not present

## 2016-10-20 DIAGNOSIS — J3089 Other allergic rhinitis: Secondary | ICD-10-CM | POA: Diagnosis not present

## 2016-10-20 DIAGNOSIS — J3081 Allergic rhinitis due to animal (cat) (dog) hair and dander: Secondary | ICD-10-CM | POA: Diagnosis not present

## 2016-10-22 ENCOUNTER — Telehealth: Payer: Self-pay | Admitting: Family Medicine

## 2016-10-22 NOTE — Telephone Encounter (Signed)
Spoke with pt and he states that he "is very upset that no one has been calling to check on him since he started his new medication". I advised pt that Dr. Yong Channel has been out of the office all week. He is concerned because his blood pressure has still been high. He reports that his readings are around 145/75. Advised pt that if it gets any higher to please contact the office. He also cannot afford the Repatha and is trying to get patient assistance with the medication, however if there is a cheaper alternative he would like to know. He also mentions that he has had increased pain from his prostatectomy and with his neuralgia since starting the Irbesartan and HCTZ. He was not sure if this increase in pain was related to the medication change. Advised pt that Dr. Yong Channel is not in the office this week so he would likely not get a reply until next week. Pt is ok with this.   Dr. Yong Channel - Please advise on above. Thanks!

## 2016-10-22 NOTE — Telephone Encounter (Signed)
Pt would like new rx generic myrbetriq [place] [facility]. Pt can not afford name brand. Pt bp 145/75 pt is on new bp med irbesartan

## 2016-10-23 NOTE — Telephone Encounter (Signed)
Called and left a voicemail message asking for a return phone call 

## 2016-10-23 NOTE — Telephone Encounter (Signed)
Have him continue current prescription and schedule office visit with me next week to recheck.   May refill mybetriq- not sure about generic option

## 2016-10-23 NOTE — Telephone Encounter (Signed)
Spoke with patient who verbalized understanding and I scheduled him for a followup

## 2016-10-25 ENCOUNTER — Encounter: Payer: Self-pay | Admitting: Neurology

## 2016-10-26 ENCOUNTER — Other Ambulatory Visit: Payer: Self-pay | Admitting: Neurology

## 2016-10-26 DIAGNOSIS — K136 Irritative hyperplasia of oral mucosa: Secondary | ICD-10-CM | POA: Diagnosis not present

## 2016-10-26 DIAGNOSIS — G609 Hereditary and idiopathic neuropathy, unspecified: Secondary | ICD-10-CM

## 2016-10-26 MED ORDER — GABAPENTIN 100 MG PO CAPS: ORAL_CAPSULE | ORAL | 11 refills | Status: DC

## 2016-10-28 DIAGNOSIS — K136 Irritative hyperplasia of oral mucosa: Secondary | ICD-10-CM | POA: Diagnosis not present

## 2016-10-30 ENCOUNTER — Telehealth: Payer: Self-pay | Admitting: Pharmacist

## 2016-10-30 NOTE — Telephone Encounter (Signed)
Patient was approved for [employer].  1st injection given 10/16/16. Will repeat lipid panel around the 6th injection to assess efficacy

## 2016-11-02 ENCOUNTER — Encounter: Payer: Self-pay | Admitting: Family Medicine

## 2016-11-02 ENCOUNTER — Ambulatory Visit (INDEPENDENT_AMBULATORY_CARE_PROVIDER_SITE_OTHER): Payer: Medicare Other | Admitting: Family Medicine

## 2016-11-02 VITALS — BP 130/78 | HR 90 | Temp 97.9°F | Ht 66.0 in | Wt 212.0 lb

## 2016-11-02 DIAGNOSIS — J301 Allergic rhinitis due to pollen: Secondary | ICD-10-CM | POA: Diagnosis not present

## 2016-11-02 DIAGNOSIS — I2510 Atherosclerotic heart disease of native coronary artery without angina pectoris: Secondary | ICD-10-CM | POA: Diagnosis not present

## 2016-11-02 DIAGNOSIS — I10 Essential (primary) hypertension: Secondary | ICD-10-CM | POA: Diagnosis not present

## 2016-11-02 DIAGNOSIS — K219 Gastro-esophageal reflux disease without esophagitis: Secondary | ICD-10-CM

## 2016-11-02 DIAGNOSIS — J3089 Other allergic rhinitis: Secondary | ICD-10-CM | POA: Diagnosis not present

## 2016-11-02 DIAGNOSIS — J3081 Allergic rhinitis due to animal (cat) (dog) hair and dander: Secondary | ICD-10-CM | POA: Diagnosis not present

## 2016-11-02 NOTE — Progress Notes (Signed)
Subjective:  ABIGAIL MARSIGLIA is a 76 y.o. year old very pleasant male patient who presents for/with See problem oriented charting ROS- No chest pain or shortness of breath. No headache or blurry vision. BP was high on losartan hctz   Past Medical History-  Patient Active Problem List   Diagnosis Date Noted  . Coronary atherosclerosis 10/12/2006    Priority: High  . Irritable bowel syndrome 10/12/2006    Priority: High  . CKD (chronic kidney disease), stage III 10/23/2013    Priority: Medium  . Neuropathy ([place]) 08/21/2013    Priority: Medium  . History of prostate cancer. Gleason 6 in 2012 04/01/2010    Priority: Medium  . Essential hypertension 02/21/2007    Priority: Medium  . Hyperlipidemia 10/12/2006    Priority: Medium  . DEPRESSION 10/12/2006    Priority: Medium  . Asthma 10/12/2006    Priority: Medium  . Obesity, Class I, BMI 30-34.9 10/03/2015    Priority: Low  . Allergic rhinitis 05/31/2014    Priority: Low  . Fatigue 11/09/2013    Priority: Low  . Dizziness 08/23/2013    Priority: Low  . RLS (restless legs syndrome) 01/03/2013    Priority: Low  . Low back pain radiating to left leg 10/09/2011    Priority: Low  . OSTEOARTHRITIS, LOWER LEG, LEFT 10/04/2009    Priority: Low  . TOBACCO ABUSE, HX OF 12/26/2008    Priority: Low  . ANEMIA, IRON DEFICIENCY 11/06/2008    Priority: Low  . 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 OSA (obstructive sleep apnea) 11/02/2006    Priority: Low  . GERD 10/12/2006    Priority: Low  . Dyslipidemia 09/23/2016  . Hereditary and idiopathic peripheral neuropathy 09/25/2014    Medications- reviewed and updated Current Outpatient Prescriptions  Medication Sig Dispense Refill  . acetaminophen (TYLENOL) 500 MG tablet Take 1,000 mg by mouth every 6 (six) hours as needed for headache (pain). Reported on 07/02/2015    . albuterol (PROAIR HFA) 108 (90 BASE) MCG/ACT inhaler Inhale 1-2 puffs into the lungs every 6  (six) hours as needed for wheezing or shortness of breath. 18 g 3  . ammonium lactate (LAC-HYDRIN FIVE) 5 % LOTN lotion Apply 1 application topically 2 (two) times daily. 340 mL 2  . aspirin EC 81 MG tablet Take 81 mg by mouth daily.    . Cyanocobalamin (VITAMIN B 12 PO) Take 800 mg by mouth daily.    [name] desloratadine (CLARINEX) 5 MG tablet Take 5 mg by mouth at bedtime.     . diazepam (VALIUM) 5 MG tablet TAKE ONE TABLET BY MOUTH AT BEDTIME AS NEEDED 30 tablet 2  . Evolocumab (REPATHA SURECLICK) 741 MG/ML SOAJ Inject 140 mg into the skin every 14 (fourteen) days. 2 pen 11  . FLOVENT HFA 110 MCG/ACT inhaler Inhale 2 puffs into the lungs 2 (two) times daily. Reported on 03/22/2015  5  . fluticasone (FLONASE) 50 MCG/ACT nasal spray Place 1 spray into both nostrils 2 (two) times daily.     . folic acid (FOLVITE) 287 MCG tablet Take 400 mcg by mouth daily.    [name] gabapentin (NEURONTIN) 100 MG capsule Take 1 cap in AM, 5 caps in PM 180 capsule 11  . hydrochlorothiazide (HYDRODIURIL) 25 MG tablet Take 1 tablet (25 mg total) by mouth daily. 90 tablet 1  . HYDROcodone-acetaminophen (NORCO/VICODIN) 5-325 MG tablet     . irbesartan (AVAPRO) 300 MG tablet Take 1 tablet (300  mg total) by mouth daily. 90 tablet 1  . ketoconazole (NIZORAL) 2 % cream Apply 1 application topically daily.    [name] lidocaine (XYLOCAINE) 2 % solution     . MYRBETRIQ 25 MG TB24 tablet     . nystatin (MYCOSTATIN/NYSTOP) powder Apply topically 4 (four) times daily.    [name] nystatin ointment (MYCOSTATIN) Apply 1 application topically 2 (two) times daily.    [name] omeprazole (PRILOSEC) 20 MG capsule TAKE ONE CAPSULE BY MOUTH ONCE DAILY AS NEEDED FOR  ACID  REFLUX/HEARTBURN 90 capsule 2  . polyethylene glycol powder (GLYCOLAX/MIRALAX) powder Take 17 g by mouth daily. 255 g 0  . Probiotic Product (ALIGN) 4 MG CAPS Take 4 mg by mouth daily.     . simvastatin (ZOCOR) 40 MG tablet Take 1 tablet (40 mg total) by mouth at bedtime. 90 tablet 3   No  current facility-administered medications for this visit.     Objective: BP 130/78   Pulse 90   Temp 97.9 F (36.6 C) (Oral)   Ht 5\' 6"  (1.676 m)   Wt 212 lb (96.2 kg)   SpO2 93%   BMI 34.22 kg/m  Gen: NAD, resting comfortably CV: regular rate Lungs: nonlabored Abdomen:obese Ext: no visible edema Skin: warm, dry  Assessment/Plan:  Essential hypertension S: controlled on irbesartan 300mg  and hctz 25mg  ( losartan hctz 100-25 mg didn't  Control after changed from valsartan-hctz 320-25).  BP Readings from Last 3 Encounters:  11/02/16 130/78  10/08/16 112/74  10/05/16 138/78  A/P: We discussed blood pressure goal of <140/90. Continue current meds:  Doing well  GERD S: patient has heard about links with kidney disease and long term PPI A/P: will trial zantac 150mg  twice a day as alternate to prilosec 20-mg- if symptoms worsen he will change back to prilosec 20mg   About a month for regular follow up- this visit scheduled due to BP med changes  Meds ordered this encounter  Medications  . HYDROcodone-acetaminophen (NORCO/VICODIN) 5-325 MG tablet  . lidocaine (XYLOCAINE) 2 % solution   Return precautions advised.  [name], MD

## 2016-11-02 NOTE — Assessment & Plan Note (Signed)
S: patient has heard about links with kidney disease and long term PPI A/P: will trial zantac 150mg  twice a day as alternate to prilosec 20-mg- if symptoms worsen he will change back to prilosec 20mg 

## 2016-11-02 NOTE — Assessment & Plan Note (Addendum)
S: controlled on irbesartan 300mg  and hctz 25mg  ( losartan hctz 100-25 mg didn't  Control after changed from valsartan-hctz 320-25).  BP Readings from Last 3 Encounters:  11/02/16 130/78  10/08/16 112/74  10/05/16 138/78  A/P: We discussed blood pressure goal of <140/90. Continue current meds:  Doing well

## 2016-11-02 NOTE — Patient Instructions (Addendum)
will trial zantac 150mg  twice a day as alternate to prilosec 20-mg- if symptoms worsen he will change back to prilosec 20mg   New blood pressure medicine looks good- continue current dose

## 2016-11-12 ENCOUNTER — Encounter: Payer: Self-pay | Admitting: Dietician

## 2016-11-12 ENCOUNTER — Encounter: Payer: Medicare Other | Attending: Family Medicine | Admitting: Dietician

## 2016-11-12 DIAGNOSIS — E669 Obesity, unspecified: Secondary | ICD-10-CM | POA: Diagnosis not present

## 2016-11-12 DIAGNOSIS — N183 Chronic kidney disease, stage 3 unspecified: Secondary | ICD-10-CM

## 2016-11-12 DIAGNOSIS — Z6834 Body mass index (BMI) 34.0-34.9, adult: Secondary | ICD-10-CM | POA: Insufficient documentation

## 2016-11-12 NOTE — Patient Instructions (Addendum)
Continue the great changes you have made!  Be mindful, eat slowly, stop when you are satisfied. Continue to avoid added salt.  Consider reducing your processed meat, pickles, and foods eaten out to eat. Bake rather than fry most often.  "Be a spreader not a glopper"  Continue to take food home from the restaurant. Consider changing your beverages to more water.  Reduce your artificial sweeteners. Consider changing your alcoholic beverage to something with fewer calories.   Continue to stay very active.

## 2016-11-12 NOTE — Progress Notes (Signed)
Medical Nutrition Therapy:  Appt start time: 8110 end time:  1700.   Assessment:  Primary concerns today: Patient is here today with his wife.  He would like to learn about healthy eating for weight control as well as CKD. Hx includes HTN, hyperlipidemia, GERD,  Increased allergies/intolerance to food (tomatoes, fish, others he did not list), h/o pancreatitis, IBS, CAD, OSA on C-pap.  He states that he gets 6-8 hours of sleep per night and this has improved since changing his diuretic to the morning.  His GFR was 55 10/14/16.  Weight today:  214 lbs which is his highest weight Lowest weight 135 lbs before he went into the [employer]. He states that he has always liked fitness and worked out frequently until over the past years he has gotten sick and was put on steroids and gained 10 lbs.  He has not been able to return to the same fitness as he gets sick.    He used to enjoy 6-7 hours per week of cycling and lifting weights.  Patient lives with his wife.  He eats out frequently.  They share shopping and have aimed towards simplification with meal preparation.  Due to his multiple food allergies/intolerances, they eat differently and cooking is more challenging.  They eat out frequently.  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  Preferred Learning Style:   No preference indicated   Learning Readiness:   Ready  Change in progress   MEDICATIONS: see list to include vitamin R-15 and folic acid   DIETARY INTAKE:  Usual eating pattern includes 3 meals and 0-1 snacks per day.  Avoided foods include potato chips, most vegetables and fish  (intollerance, allergy)  24-hr recall:  B ( AM): 1/2 cup lite yogurt PLUS 6 cups coffee (decaf) with lactaid milk and 2 sweet and low per cup, cheerios or chex, berries, equal, lactaid OR eggs, bacon, 2 slices white toast with smart balance and jelly  Snk ( AM): none  L ( PM): sandwich and fruit, 2-3 pieces of candy, vanilla  wafers OR out to eat 1/2 cheeseburger, french fries, pickles Snk ( PM): none D (6 PM): pasta or pizza OR steak or pork chop, mac and cheese, or mashed potatoes, and Salad Snk ( PM): occasional pretzels Beverages: unsweetened tea with 12-16 packs of equal per glass (-4 times per day) 6 cups coffee with lactaid,milk, 2 sweet and low per cup, 1 glass juice, chocolate martini 3 times per week (Each chocolate martini has approximately 600 calories)  Usual physical activity: started walking 40 minutes 2-3 days per week  Progress Towards Goal(s):  In progress.   Nutritional Diagnosis:  NB-1.1 Food and nutrition-related knowledge deficit As related to nutrition for weight management and CKD.  As evidenced by patient report.    Intervention:  Nutrition counseling/education related to nutrition for weight management and CKD.  Discussed mindful eating in choices, speed of eating (giving him a change to feel full), and identifying when he is satisfied.  Discussed the main large change would be the alcoholic beverage that is providing about 1800 calories per week..  Discussed benefits of continued activity and finding an activity that he enjoys. . Continue the great changes you have made!  Be mindful, eat slowly, stop when you are satisfied. Continue to avoid added salt.  Consider reducing your processed meat, pickles, and foods eaten out to eat. Bake rather than fry most often.  "Be a spreader not a glopper"  Continue to take food home from the restaurant. Consider changing your beverages to more water.  Reduce your artificial sweeteners. Consider changing your alcoholic beverage to something with fewer calories.   Continue to stay very active.  Teaching Method Utilized:  Visual Auditory Hands on  Handouts given during visit include:  Meal plan card  My plate  Dining out  Barriers to learning/adherence to lifestyle change: motivation, health that interferes with  exercise  Demonstrated degree of understanding via:  Teach Back   Monitoring/Evaluation:  Dietary intake, exercise, and body weight prn.

## 2016-11-14 ENCOUNTER — Encounter: Payer: Self-pay | Admitting: Family Medicine

## 2016-11-14 ENCOUNTER — Ambulatory Visit (INDEPENDENT_AMBULATORY_CARE_PROVIDER_SITE_OTHER): Payer: Medicare Other | Admitting: Family Medicine

## 2016-11-14 VITALS — BP 112/70 | HR 86 | Temp 97.8°F | Wt 210.0 lb

## 2016-11-14 DIAGNOSIS — I2510 Atherosclerotic heart disease of native coronary artery without angina pectoris: Secondary | ICD-10-CM | POA: Diagnosis not present

## 2016-11-14 DIAGNOSIS — M79671 Pain in right foot: Secondary | ICD-10-CM | POA: Diagnosis not present

## 2016-11-14 MED ORDER — OXYCODONE-ACETAMINOPHEN 5-325 MG PO TABS: 1.0000 | ORAL_TABLET | ORAL | 0 refills | Status: DC | PRN

## 2016-11-14 MED ORDER — PREDNISONE 5 MG PO TABS: ORAL_TABLET | ORAL | 0 refills | Status: DC

## 2016-11-14 NOTE — Progress Notes (Signed)
Joshua [name] is a 76 y.o. male here for an acute visit.  History of Present Illness:   Joshua [name] CMA acting as scribe for Dr. Juleen China.  HPI: Patient comes in today for right foot pain. The pain started yesterday in one localized spot on his foot. He had a boot that he had used for his left foot so he put it on this morning. Denies any injury otherwise. Hx of gout. Endorses some redness and warmth.  PMHx, SurgHx, SocialHx, Medications, and Allergies were reviewed in the Visit Navigator and updated as appropriate.  Current Medications:   .  albuterol (PROAIR HFA) 108 (90 BASE) MCG/ACT inhaler, Inhale 1-2 puffs into the lungs every 6 (six) hours as needed for wheezing or shortness of breath., Disp: 18 g, Rfl: 3 .  ammonium lactate (LAC-HYDRIN FIVE) 5 % LOTN lotion, Apply 1 application topically 2 (two) times daily., Disp: 340 mL, Rfl: 2 .  aspirin EC 81 MG tablet, Take 81 mg by mouth daily., Disp: , Rfl:  .  Cyanocobalamin (VITAMIN B 12 PO), Take 800 mg by mouth daily., Disp: , Rfl:  .  desloratadine (CLARINEX) 5 MG tablet, Take 5 mg by mouth at bedtime. , Disp: , Rfl:  .  diazepam (VALIUM) 5 MG tablet, TAKE ONE TABLET BY MOUTH AT BEDTIME AS NEEDED, Disp: 30 tablet, Rfl: 2 .  Evolocumab (REPATHA SURECLICK) 264 MG/ML SOAJ, Inject 140 mg into the skin every 14 (fourteen) days., Disp: 2 pen, Rfl: 11 .  FLOVENT HFA 110 MCG/ACT inhaler, Inhale 2 puffs into the lungs 2 (two) times daily. Reported on 03/22/2015, Disp: , Rfl: 5 .  fluticasone (FLONASE) 50 MCG/ACT nasal spray, Place 1 spray into both nostrils 2 (two) times daily. , Disp: , Rfl:  .  folic acid (FOLVITE) 158 MCG tablet, Take 400 mcg by mouth daily., Disp: , Rfl:  .  gabapentin (NEURONTIN) 100 MG capsule, Take 1 cap in AM, 5 caps in PM, Disp: 180 capsule, Rfl: 11 .  hydrochlorothiazide (HYDRODIURIL) 25 MG tablet, Take 1 tablet (25 mg total) by mouth daily., Disp: 90 tablet, Rfl: 1 .  irbesartan (AVAPRO) 300 MG tablet, Take 1  tablet (300 mg total) by mouth daily., Disp: 90 tablet, Rfl: 1 .  ketoconazole (NIZORAL) 2 % cream, Apply 1 application topically daily., Disp: , Rfl:  .  nystatin (MYCOSTATIN/NYSTOP) powder, Apply topically 4 (four) times daily., Disp: , Rfl:  .  nystatin ointment (MYCOSTATIN), Apply 1 application topically 2 (two) times daily., Disp: , Rfl:  .  omeprazole (PRILOSEC) 20 MG capsule, TAKE ONE CAPSULE BY MOUTH ONCE DAILY AS NEEDED FOR  ACID  REFLUX/HEARTBURN, Disp: 90 capsule, Rfl: 2 .  polyethylene glycol powder (GLYCOLAX/MIRALAX) powder, Take 17 g by mouth daily., Disp: 255 g, Rfl: 0 .  Probiotic Product (ALIGN) 4 MG CAPS, Take 4 mg by mouth daily. , Disp: , Rfl:  .  lidocaine (XYLOCAINE) 2 % solution, , Disp: , Rfl:  .  MYRBETRIQ 25 MG TB24 tablet, , Disp: , Rfl:  .  predniSONE (DELTASONE) 5 MG tablet, 6-6-5-5-4-4-3-3-2-2-1-1-off, Disp: 42 tablet, Rfl: 0 .  simvastatin (ZOCOR) 40 MG tablet, Take 1 tablet (40 mg total) by mouth at bedtime. (Patient not taking: Reported on 11/14/2016), Disp: 90 tablet, Rfl: 3   Allergies  Allergen Reactions  . 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 Bromfed Nausea And Vomiting  . Clarithromycin Other (See Comments)  gastritis  . Codeine Other (See Comments)    Trouble breathing  . Doxycycline Hyclate Nausea And Vomiting  . Modafinil Other (See Comments)     anxiety-nervousness  . Promethazine Hcl Other (See Comments)     fainting  . Quinolones     Cipro  . Telithromycin Nausea And Vomiting  . Hydrocodone-Acetaminophen Rash  . Sulfonamide Derivatives Rash   Review of Systems:   Pertinent items are noted in the HPI. Otherwise, ROS is negative.  Vitals:   Vitals:   11/14/16 1013  BP: 112/70  Pulse: 86  Temp: 97.8 F (36.6 C)  TempSrc: Oral  SpO2: 96%  Weight: 210 lb (95.3 kg)     Body mass index is 33.14 kg/m. Physical Exam:   Physical Exam  Constitutional: He is oriented  to person, place, and time. He appears well-developed and well-nourished. No distress.  HENT:  Head: Normocephalic and atraumatic.  Right Ear: External ear normal.  Left Ear: External ear normal.  Nose: Nose normal.  Mouth/Throat: Oropharynx is clear and moist.  Eyes: Pupils are equal, round, and reactive to light. Conjunctivae and EOM are normal.  Neck: Normal range of motion. Neck supple.  Cardiovascular: Normal rate, regular rhythm, normal heart sounds and intact distal pulses.   Pulmonary/Chest: Effort normal and breath sounds normal.  Abdominal: Soft. Bowel sounds are normal.  Musculoskeletal:       Right foot: There is bony tenderness and swelling.       Feet:  Neurological: He is alert and oriented to person, place, and time.  Skin: Skin is warm and dry.  Psychiatric: He has a normal mood and affect. His behavior is normal. Judgment and thought content normal.  Nursing note and vitals reviewed.   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  Orders: -     predniSONE (DELTASONE) 5 MG tablet; 6-6-5-5-4-4-3-3-2-2-1-1-off -     oxyCODONE-acetaminophen (PERCOCET/ROXICET) 5-325 MG tablet; Take 1 tablet by mouth every 4 (four) hours as needed for severe pain.   . Reviewed expectations re: course of current medical issues. . Discussed self-management of symptoms. . Outlined signs and symptoms indicating need for more acute intervention. . Patient verbalized understanding and all questions were answered. [name] Health Maintenance issues including appropriate healthy diet, exercise, and smoking avoidance were discussed with patient. . See orders for this visit as documented in the electronic medical record. . Patient received an After Visit Summary.  CMA served as Education administrator during this visit. History, Physical, and Plan performed by medical provider. The above documentation has been reviewed and is accurate  and complete. Briscoe Deutscher, D.O.  Briscoe Deutscher, DO [place], [place] 11/14/2016  Future Appointments Date Time Provider [place]  01/01/2017 2:45 PM [name], MD [facility] None  01/18/2017 8:30 AM Cameron Sprang, MD [facility] None

## 2016-11-16 DIAGNOSIS — J3089 Other allergic rhinitis: Secondary | ICD-10-CM | POA: Diagnosis not present

## 2016-11-16 DIAGNOSIS — J3081 Allergic rhinitis due to animal (cat) (dog) hair and dander: Secondary | ICD-10-CM | POA: Diagnosis not present

## 2016-11-16 DIAGNOSIS — J301 Allergic rhinitis due to pollen: Secondary | ICD-10-CM | POA: Diagnosis not present

## 2016-11-19 ENCOUNTER — Ambulatory Visit (INDEPENDENT_AMBULATORY_CARE_PROVIDER_SITE_OTHER): Payer: Medicare Other | Admitting: Family Medicine

## 2016-11-19 ENCOUNTER — Encounter: Payer: Self-pay | Admitting: Family Medicine

## 2016-11-19 DIAGNOSIS — I2510 Atherosclerotic heart disease of native coronary artery without angina pectoris: Secondary | ICD-10-CM

## 2016-11-19 DIAGNOSIS — M10071 Idiopathic gout, right ankle and foot: Secondary | ICD-10-CM

## 2016-11-19 NOTE — Patient Instructions (Addendum)
Foot/gout is really improving. Finish prednisone.   Would avoid walking a large amount so would not volunteer tomorrow.  Should be ok for vacation next week  We can get a uric acid when you are not in a flare up.   You love volunteering- I would not stop this during cold/flu season unless you feel very strongly about stopping

## 2016-11-19 NOTE — Progress Notes (Signed)
Subjective:  Joshua [name] is a 76 y.o. year old very pleasant male patient who presents for/with See problem oriented charting ROS- right foot pain, no fever or chills- redness and pain and swelling largely resolved  Past Medical History-  Patient Active Problem List   Diagnosis Date Noted  . Coronary atherosclerosis 10/12/2006    Priority: High  . Irritable bowel syndrome 10/12/2006    Priority: High  . CKD (chronic kidney disease), stage III 10/23/2013    Priority: Medium  . Neuropathy ([place]) 08/21/2013    Priority: Medium  . History of prostate cancer. Gleason 6 in 2012 04/01/2010    Priority: Medium  . Essential hypertension 02/21/2007    Priority: Medium  . Hyperlipidemia 10/12/2006    Priority: Medium  . DEPRESSION 10/12/2006    Priority: Medium  . Asthma 10/12/2006    Priority: Medium  . Obesity, Class I, BMI 30-34.9 10/03/2015    Priority: Low  . Allergic rhinitis 05/31/2014    Priority: Low  . Fatigue 11/09/2013    Priority: Low  . Dizziness 08/23/2013    Priority: Low  . RLS (restless legs syndrome) 01/03/2013    Priority: Low  . Low back pain radiating to left leg 10/09/2011    Priority: Low  . OSTEOARTHRITIS, LOWER LEG, LEFT 10/04/2009    Priority: Low  . TOBACCO ABUSE, HX OF 12/26/2008    Priority: Low  . ANEMIA, IRON DEFICIENCY 11/06/2008    Priority: Low  . 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 OSA (obstructive sleep apnea) 11/02/2006    Priority: Low  . GERD 10/12/2006    Priority: Low  . Gout 11/20/2016  . Dyslipidemia 09/23/2016  . Hereditary and idiopathic peripheral neuropathy 09/25/2014    Medications- reviewed and updated Current Outpatient Prescriptions  Medication Sig Dispense Refill  . acetaminophen (TYLENOL) 500 MG tablet Take 1,000 mg by mouth every 6 (six) hours as needed for headache (pain). Reported on 07/02/2015    . albuterol (PROAIR HFA) 108 (90 BASE) MCG/ACT inhaler Inhale 1-2 puffs into the lungs  every 6 (six) hours as needed for wheezing or shortness of breath. 18 g 3  . ammonium lactate (LAC-HYDRIN FIVE) 5 % LOTN lotion Apply 1 application topically 2 (two) times daily. 340 mL 2  . aspirin EC 81 MG tablet Take 81 mg by mouth daily.    . Cyanocobalamin (VITAMIN B 12 PO) Take 800 mg by mouth daily.    [name] desloratadine (CLARINEX) 5 MG tablet Take 5 mg by mouth at bedtime.     . diazepam (VALIUM) 5 MG tablet TAKE ONE TABLET BY MOUTH AT BEDTIME AS NEEDED 30 tablet 2  . Evolocumab (REPATHA SURECLICK) 109 MG/ML SOAJ Inject 140 mg into the skin every 14 (fourteen) days. 2 pen 11  . FLOVENT HFA 110 MCG/ACT inhaler Inhale 2 puffs into the lungs 2 (two) times daily. Reported on 03/22/2015  5  . fluticasone (FLONASE) 50 MCG/ACT nasal spray Place 1 spray into both nostrils 2 (two) times daily.     . folic acid (FOLVITE) 323 MCG tablet Take 400 mcg by mouth daily.    [name] gabapentin (NEURONTIN) 100 MG capsule Take 1 cap in AM, 5 caps in PM 180 capsule 11  . hydrochlorothiazide (HYDRODIURIL) 25 MG tablet Take 1 tablet (25 mg total) by mouth daily. 90 tablet 1  . irbesartan (AVAPRO) 300 MG tablet Take 1 tablet (300 mg total) by mouth daily. 90 tablet 1  . ketoconazole (  NIZORAL) 2 % cream Apply 1 application topically daily.    [name] lidocaine (XYLOCAINE) 2 % solution     . MYRBETRIQ 25 MG TB24 tablet     . nystatin (MYCOSTATIN/NYSTOP) powder Apply topically 4 (four) times daily.    [name] nystatin ointment (MYCOSTATIN) Apply 1 application topically 2 (two) times daily.    [name] omeprazole (PRILOSEC) 20 MG capsule TAKE ONE CAPSULE BY MOUTH ONCE DAILY AS NEEDED FOR  ACID  REFLUX/HEARTBURN 90 capsule 2  . polyethylene glycol powder (GLYCOLAX/MIRALAX) powder Take 17 g by mouth daily. 255 g 0  . predniSONE (DELTASONE) 5 MG tablet 6-6-5-5-4-4-3-3-2-2-1-1-off 42 tablet 0  . Probiotic Product (ALIGN) 4 MG CAPS Take 4 mg by mouth daily.     . simvastatin (ZOCOR) 40 MG tablet Take 1 tablet (40 mg total) by mouth at bedtime. 90  tablet 3   No current facility-administered medications for this visit.     Objective: BP 136/78   Pulse 71   Temp 98.3 F (36.8 C) (Oral)   Ht 5' 6.75" (1.695 m)   Wt 216 lb 12.8 oz (98.3 kg)   SpO2 97%   BMI 34.21 kg/m  Gen: NAD, resting comfortably CV: RRR no murmurs rubs or gallops Lungs: CTAB no crackles, wheeze, rhonchi Abdomen: soft/nontender/nondistended/normal bowel sounds.  Ext: no edema Skin: warm, dry Msk: pain with palpation of right 5th metatarsal head   Assessment/Plan:  Gout S: patient with right foot pain starting about a week ago in the morning when he woke up. Volunteered 2.5 hours- walking the whole time- went [facility] Dr[facility] Juleen [facility]. She said he has gout- given prednisone and oxycycone. oxycycodone made him too itchy so took tramadol instead. Has been improving some. Today slightly worse than yesterday. Pain level right now 3/10. At its worst was 8/10. Beef Thursday night  Asks about uric acid lowering medication. Last gout attack years ago.   Vacation next Wednesday- driving to [place] to be with brother A/P: Gout attack last week improving on prednisone. He was advised not to volunteer tomorrow as would be on feet 2.5 hours and may set him back. Likely can travel to [place] next week. He asks about medicine to lower uric acid and discussed would not do that in acute flare. Since improving on prednisone and a few days left encouraged him to continue current medications. Get uric acid when not in acute flare up then consider medication- though likely woudlnt unless starts to have more regular flare ups.   Has regular follow up in 6 weeks and can reassess and consider uric acid at that time. Discussed low uric acid diet.   Future Appointments Date Time Provider [place]  01/01/2017 2:45 PM [name], MD [facility] None  01/18/2017 8:30 AM Cameron Sprang, MD [facility] None   The duration of face-to-face time during this visit was  greater than 15 minutes. Greater than 50% of this time was spent in counseling, explanation of diagnosis, planning of further management, and/or coordination of care including discussions of gout and uric acid levels and treatment options.  Return precautions advised.  [name], MD

## 2016-11-20 DIAGNOSIS — M109 Gout, unspecified: Secondary | ICD-10-CM | POA: Insufficient documentation

## 2016-11-20 NOTE — Assessment & Plan Note (Addendum)
S: patient with right foot pain starting about a week ago in the morning when he woke up. Volunteered 2.5 hours- walking the whole time- went saw Dr. Juleen [facility]. She said he has gout- given prednisone and oxycycone. oxycycodone made him too itchy so took tramadol instead. Has been improving some. Today slightly worse than yesterday. Pain level right now 3/10. At its worst was 8/10. Beef Thursday night  Asks about uric acid lowering medication. Last gout attack years ago.   Vacation next Wednesday- driving to [place] to be with brother A/P: Gout attack last week improving on prednisone. He was advised not to volunteer tomorrow as would be on feet 2.5 hours and may set him back. Likely can travel to [place] next week. He asks about medicine to lower uric acid and discussed would not do that in acute flare. Since improving on prednisone and a few days left encouraged him to continue current medications. Get uric acid when not in acute flare up then consider medication- though likely woudlnt unless starts to have more regular flare ups.   Has regular follow up in 6 weeks and can reassess and consider uric acid at that time. Discussed low uric acid diet.

## 2016-11-24 ENCOUNTER — Ambulatory Visit (INDEPENDENT_AMBULATORY_CARE_PROVIDER_SITE_OTHER): Payer: Medicare Other | Admitting: Family Medicine

## 2016-11-24 ENCOUNTER — Other Ambulatory Visit: Payer: Self-pay

## 2016-11-24 ENCOUNTER — Encounter: Payer: Self-pay | Admitting: Family Medicine

## 2016-11-24 ENCOUNTER — Ambulatory Visit (INDEPENDENT_AMBULATORY_CARE_PROVIDER_SITE_OTHER)
Admission: RE | Admit: 2016-11-24 | Discharge: 2016-11-24 | Disposition: A | Discharge status: Home / Self Care | Payer: Medicare Other | Source: Ambulatory Visit | Attending: Family Medicine | Admitting: Family Medicine

## 2016-11-24 VITALS — BP 124/72 | HR 70 | Temp 98.3°F | Ht 66.75 in | Wt 211.4 lb

## 2016-11-24 DIAGNOSIS — M79671 Pain in right foot: Secondary | ICD-10-CM

## 2016-11-24 DIAGNOSIS — I2510 Atherosclerotic heart disease of native coronary artery without angina pectoris: Secondary | ICD-10-CM | POA: Diagnosis not present

## 2016-11-24 NOTE — Progress Notes (Signed)
Subjective:  Joshua [name] is a 76 y.o. year old very pleasant male patient who presents for/with See problem oriented charting ROS- no fever or chills. Feels some fatigue and some congestion in sinuses. Feels run down.    Past Medical History-  Patient Active Problem List   Diagnosis Date Noted  . Coronary atherosclerosis 10/12/2006    Priority: High  . Irritable bowel syndrome 10/12/2006    Priority: High  . CKD (chronic kidney disease), stage III 10/23/2013    Priority: Medium  . Neuropathy ([place]) 08/21/2013    Priority: Medium  . History of prostate cancer. Gleason 6 in 2012 04/01/2010    Priority: Medium  . Essential hypertension 02/21/2007    Priority: Medium  . Hyperlipidemia 10/12/2006    Priority: Medium  . DEPRESSION 10/12/2006    Priority: Medium  . Asthma 10/12/2006    Priority: Medium  . Obesity, Class I, BMI 30-34.9 10/03/2015    Priority: Low  . Allergic rhinitis 05/31/2014    Priority: Low  . Fatigue 11/09/2013    Priority: Low  . Dizziness 08/23/2013    Priority: Low  . RLS (restless legs syndrome) 01/03/2013    Priority: Low  . Low back pain radiating to left leg 10/09/2011    Priority: Low  . OSTEOARTHRITIS, LOWER LEG, LEFT 10/04/2009    Priority: Low  . TOBACCO ABUSE, HX OF 12/26/2008    Priority: Low  . ANEMIA, IRON DEFICIENCY 11/06/2008    Priority: Low  . 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 OSA (obstructive sleep apnea) 11/02/2006    Priority: Low  . GERD 10/12/2006    Priority: Low  . Gout 11/20/2016  . Dyslipidemia 09/23/2016  . Hereditary and idiopathic peripheral neuropathy 09/25/2014    Medications- reviewed and updated Current Outpatient Prescriptions  Medication Sig Dispense Refill  . acetaminophen (TYLENOL) 500 MG tablet Take 1,000 mg by mouth every 6 (six) hours as needed for headache (pain). Reported on 07/02/2015    . albuterol (PROAIR HFA) 108 (90 BASE) MCG/ACT inhaler Inhale 1-2 puffs into the  lungs every 6 (six) hours as needed for wheezing or shortness of breath. 18 g 3  . ammonium lactate (LAC-HYDRIN FIVE) 5 % LOTN lotion Apply 1 application topically 2 (two) times daily. 340 mL 2  . aspirin EC 81 MG tablet Take 81 mg by mouth daily.    . Cyanocobalamin (VITAMIN B 12 PO) Take 800 mg by mouth daily.    [name] desloratadine (CLARINEX) 5 MG tablet Take 5 mg by mouth at bedtime.     . diazepam (VALIUM) 5 MG tablet TAKE ONE TABLET BY MOUTH AT BEDTIME AS NEEDED 30 tablet 2  . Evolocumab (REPATHA SURECLICK) 062 MG/ML SOAJ Inject 140 mg into the skin every 14 (fourteen) days. 2 pen 11  . FLOVENT HFA 110 MCG/ACT inhaler Inhale 2 puffs into the lungs 2 (two) times daily. Reported on 03/22/2015  5  . fluticasone (FLONASE) 50 MCG/ACT nasal spray Place 1 spray into both nostrils 2 (two) times daily.     . folic acid (FOLVITE) 694 MCG tablet Take 400 mcg by mouth daily.    [name] gabapentin (NEURONTIN) 100 MG capsule Take 1 cap in AM, 5 caps in PM 180 capsule 11  . hydrochlorothiazide (HYDRODIURIL) 25 MG tablet Take 1 tablet (25 mg total) by mouth daily. 90 tablet 1  . irbesartan (AVAPRO) 300 MG tablet Take 1 tablet (300 mg total) by mouth daily. 90 tablet 1  .  ketoconazole (NIZORAL) 2 % cream Apply 1 application topically daily.    [name] lidocaine (XYLOCAINE) 2 % solution     . MYRBETRIQ 25 MG TB24 tablet     . nystatin (MYCOSTATIN/NYSTOP) powder Apply topically 4 (four) times daily.    [name] nystatin ointment (MYCOSTATIN) Apply 1 application topically 2 (two) times daily.    [name] omeprazole (PRILOSEC) 20 MG capsule TAKE ONE CAPSULE BY MOUTH ONCE DAILY AS NEEDED FOR  ACID  REFLUX/HEARTBURN 90 capsule 2  . polyethylene glycol powder (GLYCOLAX/MIRALAX) powder Take 17 g by mouth daily. 255 g 0  . predniSONE (DELTASONE) 5 MG tablet 6-6-5-5-4-4-3-3-2-2-1-1-off 42 tablet 0  . Probiotic Product (ALIGN) 4 MG CAPS Take 4 mg by mouth daily.     . simvastatin (ZOCOR) 40 MG tablet Take 1 tablet (40 mg total) by mouth at  bedtime. 90 tablet 3   No current facility-administered medications for this visit.     Objective: BP 124/72 (BP Location: Left Arm, Patient Position: Sitting, Cuff Size: Large)   Pulse 70   Temp 98.3 F (36.8 C) (Oral)   Ht 5' 6.75" (1.695 m)   Wt 211 lb 6.4 oz (95.9 kg)   SpO2 95%   BMI 33.36 kg/m  Gen: NAD, resting comfortably No clear sinus tenderness CV: RRR no murmurs rubs or gallops Lungs: CTAB no crackles, wheeze, rhonchi Ext: no edema Skin: warm, dry MSK: pain with palpation at head of 5th metatarsal. No warmth, erythema, edema.   Assessment/Plan:  Right foot pain - Plan: DG Foot Complete Right S:  patient seen about 5 days ago. Gout was resolvoing on prednisone for right lateral foot- oddly was at head of 5th metatarsal. Pain down to 3/10 at that time. Was to finish prednisone. Was to stay off of it at that time and not volunteer.   Patient states pain flared back up yesterday despite taking it easy. He was was coming down on prednisone (only 2 days left). He states pain back up to 8/10. Did not get hot or swollen like previously. Better today slightly. Also feels fatigued and run down as prednisone dose has decreased. Also hurts up into top of his midfoot but more mildly. Trmadol helps some. Still finishing up prednisone (2 more days)  Does not feel like his neuropathy- primarily severe coldness in his toes.  A/P: Unclear cause of pain but does not appear to be gout based on no erythema, warmth, swelling. I told him atypical presentation of his neuropathy possible. Also higher risk for fracture with neuropathy so will get x-ray to evaluate. He agrees to this. Strong likelihood will refer to sports medicine or podiatry (pending results)  Hold off on meds until x-ray Future Appointments Date Time Provider [place]  01/01/2017 2:45 PM [name], MD [facility] None  01/18/2017 8:30 AM Cameron Sprang, MD [facility] None   Orders Placed This Encounter   Procedures  . DG Foot Complete Right    Standing Status:   Future    Standing Expiration Date:   01/24/2018    Order Specific Question:   Reason for Exam (SYMPTOM  OR DIAGNOSIS REQUIRED)    Answer:   pain at head of 5th metatarsal    Order Specific Question:   Preferred imaging location?    Answer:   [facility]    Order Specific Question:   Radiology Contrast Protocol - do NOT remove file path    Answer:   [email or web address]   Return precautions advised.  Annie Main  Yong Channel, MD

## 2016-11-24 NOTE — Patient Instructions (Addendum)
Please go to [facility] - located [address] across [address] from [place] - in the basement - Hours: 8:30-5:30 PM M-F. Do not need appointment.   HPC sheet  Doesn't look like gout this time- no hot, swollen joint. Lets rule out hairline fracture. May get you in with sports medicine or podiatry depending on results.

## 2016-11-25 ENCOUNTER — Encounter: Payer: Self-pay | Admitting: Sports Medicine

## 2016-11-25 ENCOUNTER — Ambulatory Visit: Payer: Self-pay

## 2016-11-25 ENCOUNTER — Ambulatory Visit (INDEPENDENT_AMBULATORY_CARE_PROVIDER_SITE_OTHER): Payer: Medicare Other | Admitting: Sports Medicine

## 2016-11-25 VITALS — BP 116/66 | HR 87 | Ht 66.75 in | Wt 212.0 lb

## 2016-11-25 DIAGNOSIS — M79671 Pain in right foot: Secondary | ICD-10-CM

## 2016-11-25 DIAGNOSIS — R51 Headache: Secondary | ICD-10-CM | POA: Diagnosis not present

## 2016-11-25 DIAGNOSIS — M10071 Idiopathic gout, right ankle and foot: Secondary | ICD-10-CM | POA: Diagnosis not present

## 2016-11-25 DIAGNOSIS — I10 Essential (primary) hypertension: Secondary | ICD-10-CM

## 2016-11-25 DIAGNOSIS — I2510 Atherosclerotic heart disease of native coronary artery without angina pectoris: Secondary | ICD-10-CM | POA: Diagnosis not present

## 2016-11-25 DIAGNOSIS — R519 Headache, unspecified: Secondary | ICD-10-CM

## 2016-11-25 DIAGNOSIS — G629 Polyneuropathy, unspecified: Secondary | ICD-10-CM

## 2016-11-25 DIAGNOSIS — S92301A Fracture of unspecified metatarsal bone(s), right foot, initial encounter for closed fracture: Secondary | ICD-10-CM | POA: Diagnosis not present

## 2016-11-25 MED ORDER — NITROGLYCERIN 0.2 MG/HR TD PT24: MEDICATED_PATCH | TRANSDERMAL | 1 refills | Status: DC

## 2016-11-25 NOTE — Patient Instructions (Signed)
Nitroglycerin Protocol  Apply 1/4 nitroglycerin patch to affected area daily. Change position of patch within the affected area every 24 hours. You may experience a headache during the first 1-2 weeks of using the patch, these should subside. If you experience headaches after beginning nitroglycerin patch treatment, you may take your preferred over the counter pain reliever. Another side effect of the nitroglycerin patch is skin irritation or rash related to patch adhesive. Please notify our office if you develop more severe headaches or rash, and stop the patch. Tendon healing with nitroglycerin patch may require 12 to 24 weeks depending on the extent of injury. Men should not use if taking Viagra, Cialis, or Levitra.  Do not use if you have migraines or rosacea.   

## 2016-11-25 NOTE — Progress Notes (Signed)
OFFICE VISIT NOTE Joshua [name], [place] at [place]  Joshua [name] - 76 y.o. male MRN [id number]  Date of birth: Jan 31, 1941  Visit Date: 11/25/2016  PCP: Joshua Olp, MD   Referred by: Joshua Olp, MD  Joshua [name], CMA acting as scribe for Dr. Paulla [name].  SUBJECTIVE:   Chief Complaint  Patient presents with  . New Patient (Initial Visit)    RT foot pain   HPI: As below and per problem based documentation when appropriate.  Joshua [name] is a new patient presenting today with complaint of RT foot pain. He was seen 11/14/2016 by Dr. Juleen [name] and prescribed Prednisone for gout flare-up. He has also been taking Tramadol as needed for the pain. He has seen Dr. Yong [name] twice since then for the pain. He had xray of the foot 11/24/2016 which showed no fracture.   Pt reports that he was walking around in his slipper and rolled on his toe and felt some pain in his foot. He didn't think much of it and went to volunteer. Pain got worse so he was seen at the after hours clinic and was prescribed Prednisone. He washed his car this past Monday and noticed that they pain increased. He was working around the house today while he was waiting to get an appointment and the pain seemed to get worse throughout the day. He hasn't noticed any increased warmth or redness on the foot. He does have tenderness to palpation. He has noticed slight swelling and noticed a small bump on the lateral aspect of the RT foot.   Pain is described as sharp pain and rated about 5/10 now but around 8/10 this past Monday.   Pain is worse after getting up and moving around.  The pain seems to radiate into the lateral leg now which is a new symptom. Pain stops at the knee.  He takes Gabapentin for neuropathy and he has Tramadol left over from 2016 that he has been taking prn for the pain.     Review of Systems  Constitutional: Negative for chills and fever.    Respiratory: Negative for shortness of breath and wheezing.   Cardiovascular: Negative for chest pain, palpitations and leg swelling.  Neurological: Positive for dizziness, tingling and headaches.  Endo/Heme/Allergies: Bruises/bleeds easily.    Otherwise per HPI.  HISTORY & PERTINENT PRIOR DATA:  No specialty comments available. He reports that he quit smoking about 24 years ago. His smoking use included Cigarettes. He has a 35.00 pack-year smoking history. He has never used smokeless tobacco. No results for input(s): HGBA1C, LABURIC in the last 8760 hours. Medications & Allergies reviewed per EMR Patient Active Problem List   Diagnosis Date Noted  . Avulsion fracture of metatarsal bone of right foot 11/25/2016  . Gout 11/20/2016  . Dyslipidemia 09/23/2016  . Obesity, Class I, BMI 30-34.9 10/03/2015  . Hereditary and idiopathic peripheral neuropathy 09/25/2014  . Allergic rhinitis 05/31/2014  . Fatigue 11/09/2013  . CKD (chronic kidney disease), stage III 10/23/2013  . Dizziness 08/23/2013  . Neuropathy ([name]) 08/21/2013  . RLS (restless legs syndrome) 01/03/2013  . Low back pain radiating to left leg 10/09/2011  . History of prostate cancer. Gleason 6 in 2012 04/01/2010  . OSTEOARTHRITIS, LOWER LEG, LEFT 10/04/2009  . TOBACCO ABUSE, HX OF 12/26/2008  . ANEMIA, IRON DEFICIENCY 11/06/2008  . 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 Essential hypertension 02/21/2007  . OSA (obstructive sleep apnea) 11/02/2006  .  Hyperlipidemia 10/12/2006  . DEPRESSION 10/12/2006  . Coronary atherosclerosis 10/12/2006  . Asthma 10/12/2006  . GERD 10/12/2006  . Irritable bowel syndrome 10/12/2006   Past Medical History:  Diagnosis Date  . Acute medial meniscal tear   . Anemia   . Arthritis    "middle finger right hand; right knee; neck" (02/06/2014)  . Asthma   . 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 CAD (coronary artery disease)    CABG 2001  . Depression    . Diverticulosis   . DJD (degenerative joint disease)   . GERD (gastroesophageal reflux disease)   . History of blood transfusion 2001   "related to the bypass OR"  . History of gout   . Hyperlipidemia   . Hypertension   . IBS (irritable bowel syndrome)   . Obesity   . OSA (obstructive sleep apnea)   . Pancreatitis ~ 1980  . Pneumonia 1980's X 2  . Prostate cancer ([place]) 04/2010   Family History  Problem Relation Age of Onset  . Heart disease Mother   . Hyperlipidemia Mother   . Heart disease Father   . Hyperlipidemia Father   . Diabetes Brother    Past Surgical History:  Procedure Laterality Date  . ANTERIOR CERVICAL DECOMP/DISCECTOMY FUSION  05/26/06  . BACK SURGERY    . CARDIAC CATHETERIZATION  11/20/1999  . CORONARY ARTERY BYPASS GRAFT  11/20/1999   SVG-RI1-RI2, SVG-OM, SVG-dRCA  . KNEE ARTHROSCOPY Right 1992; 04/2008  . LAPAROSCOPIC CHOLECYSTECTOMY  2007  . NASAL SEPTUM SURGERY Right 2007  . ROBOT ASSISTED LAPAROSCOPIC RADICAL PROSTATECTOMY  04/2010  . THYROIDECTOMY, PARTIAL  1980   Social History   Occupational History  . retired Retired   Social History Main Topics  . Smoking status: Former Smoker    Packs/day: 1.00    Years: 35.00    Types: Cigarettes    Quit date: 06/16/1992  . Smokeless tobacco: Never Used     Comment: discussed the AAA but thinks he may have had this   . Alcohol use 2.4 oz/week    4 Shots of liquor per week     Comment: mixed drink  . Drug use: No  . Sexual activity: No    OBJECTIVE:  VS:  HT:5' 6.75" (169.5 cm)   WT:212 lb (96.2 kg)  BMI:33.47    BP:116/66  HR:87bpm  TEMP: ( )  RESP:95 % EXAM: Findings:  WDWN, NAD, Non-toxic appearing Alert & appropriately interactive Not depressed or anxious appearing No increased work of breathing. Pupils are equal. EOM intact without nystagmus No clubbing or cyanosis of the extremities appreciated No significant rashes/lesions/ulcerations overlying the examined area. DP & PT pulses  2+/4.  Trace pretibial edema bilaterally. Generalized dysesthesia of the bilateral lower extremities in a nondermatomal distribution.  Sensation is intact to light touch however.  Right foot: Overall normal alignment of the foot.  He does have prominence at the base of the fifth metatarsal on inspection.  He has focal TTP over the base of the fifth metatarsal but more on the tendon side.  He does have pain with foot squeeze test that localizes to the base of the fifth metatarsal.  No pain with calcaneal squeeze.  No significant pain with midfoot abduction.  He has marked pain with resisted eversion as well as some pain with resisted plantarflexion and dorsiflexion.  No pain with resisted inversion.     Dg Foot Complete Right  Result Date: 11/24/2016 CLINICAL DATA:  Right lateral foot pain.  No known injury. EXAM: RIGHT FOOT COMPLETE - 3+ VIEW COMPARISON:  None. FINDINGS: No acute bony abnormality. Specifically, no fracture, subluxation, or dislocation. Soft tissues are intact. Joint spaces are maintained. IMPRESSION: No acute bony abnormality. Electronically Signed   By: Rolm Baptise M.D.   On: 11/24/2016 14:59   [place] Limited Joint Space Structures Low Right(no Linked Charges)  Result Date: 11/25/2016 Gerda Diss, DO     11/26/2016  8:54 AM LIMITED MSK ULTRASOUND OF right foot Images were obtained and interpreted by myself, Teresa Coombs, DO Images have been saved and stored to PACS system. Images obtained on: GE S7 Ultrasound machine FINDINGS:  There is a small amount of subcutaneous edema surrounding the base of the fifth metatarsal but this is mild.   Marked increased neovascularity within this region.   Most focal changes over the Fibularis brevis insertion on the base of the fifth metatarsal.  There is a small amount of hypoechoic change surrounding the fibularis musculature with chronic thickening of the fibularis brevis tendon.  There is a slight fragmentation of the proximal waist of the  fifth metatarsal. IMPRESSION: 1. Tendon sided small avulsion fracture of the base of the fifth metatarsal/fibularis brevis (peroneous brevis) insertion injury.  (Zone 1)   ASSESSMENT & PLAN:     ICD-10-CM   1. Right foot pain M79.671 [place] LIMITED JOINT SPACE STRUCTURES LOW RIGHT(NO LINKED CHARGES)  2. Closed avulsion fracture of metatarsal bone of right foot, initial encounter S92.301A   3. Acute idiopathic gout of right foot M10.071   4. Essential hypertension I10   5. Acute nonintractable headache, unspecified headache type R51   6. Neuropathy (HCC) G62.9   ================================================================= Avulsion fracture of metatarsal bone of right foot Small tendon sided avulsion fracture of the fibularis brevis tendon.  Given the tendon involvement nitroglycerin protocol and immobilization for comfort until this fully heals.  We will plan to check in with him in 2 weeks and see how he is progressing but will plan for likely 4 weeks of boot immobilization with weightbearing.  Okay to transition to the postoperative shoe for driving.  Hope to transition to postoperative shoe for ambulation in 2 weeks but may take an additional several weeks beyond this.  Okay to come out of it at nighttime.  Gentle range of motion of the ankle while at rest.  Caution with nitroglycerin for his underlying dizziness and prior headache but have a low suspicion that this will be significantly symptomatic given the fact we are using this in such a distal location.  Gout Presentation was consistent with gout but given findings today at do suspect that this does reflect more of an exacerbation of the injury he sustained prior to the onset of the symptoms.  Continue to monitor and if any lack of improvement will consider uric acid check but no indication for medication changes at this time.  Would need to consider discontinuing his HCTZ if uric acid is  elevated ================================================================= Patient Instructions  Nitroglycerin Protocol   Apply 1/4 nitroglycerin patch to affected area daily.  Change position of patch within the affected area every 24 hours.  You may experience a headache during the first 1-2 weeks of using the patch, these should subside.  If you experience headaches after beginning nitroglycerin patch treatment, you may take your preferred over the counter pain reliever.  Another side effect of the nitroglycerin patch is skin irritation or rash related to patch adhesive.  Please notify  our office if you develop more severe headaches or rash, and stop the patch.  Tendon healing with nitroglycerin patch may require 12 to 24 weeks depending on the extent of injury.  Men should not use if taking Viagra, Cialis, or Levitra.   Do not use if you have migraines or rosacea.    =================================================================  Follow-up: Return in about 2 weeks (around 12/09/2016).   CMA/ATC served as Education administrator during this visit. History, Physical, and Plan performed by medical provider. Documentation and orders reviewed and attested to.      Teresa Coombs, [facility] Physician

## 2016-11-25 NOTE — Assessment & Plan Note (Addendum)
Small tendon sided avulsion fracture of the fibularis brevis tendon.  Given the tendon involvement nitroglycerin protocol and immobilization for comfort until this fully heals.  We will plan to check in with him in 2 weeks and see how he is progressing but will plan for likely 4 weeks of boot immobilization with weightbearing.  Okay to transition to the postoperative shoe for driving.  Hope to transition to postoperative shoe for ambulation in 2 weeks but may take an additional several weeks beyond this.  Okay to come out of it at nighttime.  Gentle range of motion of the ankle while at rest.  Caution with nitroglycerin for his underlying dizziness and prior headache but have a low suspicion that this will be significantly symptomatic given the fact we are using this in such a distal location.

## 2016-11-26 NOTE — Procedures (Signed)
LIMITED MSK ULTRASOUND OF right foot  Images were obtained and interpreted by myself, Teresa Coombs, DO  Images have been saved and stored to PACS system. Images obtained on: GE S7 Ultrasound machine  FINDINGS:   There is a small amount of subcutaneous edema surrounding the base of the fifth metatarsal but this is mild.    Marked increased neovascularity within this region.    Most focal changes over the Fibularis brevis insertion on the base of the fifth metatarsal.  There is a small amount of hypoechoic change surrounding the fibularis musculature with chronic thickening of the fibularis brevis tendon.  There is a slight fragmentation of the proximal waist of the fifth metatarsal.  IMPRESSION:  1. Tendon sided small avulsion fracture of the base of the fifth metatarsal/fibularis brevis (peroneous brevis) insertion injury.  (Zone 1)

## 2016-11-26 NOTE — Assessment & Plan Note (Signed)
Presentation was consistent with gout but given findings today at do suspect that this does reflect more of an exacerbation of the injury he sustained prior to the onset of the symptoms.  Continue to monitor and if any lack of improvement will consider uric acid check but no indication for medication changes at this time.  Would need to consider discontinuing his HCTZ if uric acid is elevated

## 2016-11-30 DIAGNOSIS — J3081 Allergic rhinitis due to animal (cat) (dog) hair and dander: Secondary | ICD-10-CM | POA: Diagnosis not present

## 2016-11-30 DIAGNOSIS — J301 Allergic rhinitis due to pollen: Secondary | ICD-10-CM | POA: Diagnosis not present

## 2016-11-30 DIAGNOSIS — J3089 Other allergic rhinitis: Secondary | ICD-10-CM | POA: Diagnosis not present

## 2016-12-03 ENCOUNTER — Telehealth: Payer: Self-pay | Admitting: Family Medicine

## 2016-12-03 MED ORDER — NAFTIFINE HCL 2 % EX GEL: CUTANEOUS | 1 refills | Status: DC

## 2016-12-03 NOTE — Telephone Encounter (Signed)
Pt would like to know if Dr Yong Channel will send in a Rx naftin gel 1%  Pt states he has crotch itch and it has gotten worse later.  This gel is the only thing that helps.      [place], [place]

## 2016-12-03 NOTE — Telephone Encounter (Signed)
Sent in please inform patient 

## 2016-12-03 NOTE — Telephone Encounter (Signed)
Spoke with patient and he is aware 

## 2016-12-07 ENCOUNTER — Encounter: Payer: Self-pay | Admitting: Cardiology

## 2016-12-07 NOTE — Telephone Encounter (Signed)
Called and spoke to pt to remained on 81 mg aspirin, pt is aware and voice understanding.

## 2016-12-07 NOTE — Telephone Encounter (Signed)
-----   Message from [name], MD sent at 12/07/2016 12:22 PM EDT ----- Please call.  He sent me a question and I don't know if my answer went through.  He should remain on ASA because he has known heart disease with previous CABG.

## 2016-12-08 ENCOUNTER — Encounter: Payer: Self-pay | Admitting: Family Medicine

## 2016-12-08 ENCOUNTER — Ambulatory Visit (INDEPENDENT_AMBULATORY_CARE_PROVIDER_SITE_OTHER): Payer: Medicare Other | Admitting: Sports Medicine

## 2016-12-08 ENCOUNTER — Encounter: Payer: Self-pay | Admitting: Sports Medicine

## 2016-12-08 VITALS — BP 136/82 | HR 75 | Ht 66.75 in | Wt 211.2 lb

## 2016-12-08 DIAGNOSIS — I2510 Atherosclerotic heart disease of native coronary artery without angina pectoris: Secondary | ICD-10-CM | POA: Diagnosis not present

## 2016-12-08 DIAGNOSIS — S92301D Fracture of unspecified metatarsal bone(s), right foot, subsequent encounter for fracture with routine healing: Secondary | ICD-10-CM

## 2016-12-08 NOTE — Progress Notes (Signed)
OFFICE VISIT NOTE [name] [name], [place] at [place]  Joshua [name] - 76 y.o. male MRN [id number]  Date of birth: 10/10/1940  Visit Date: 12/08/2016  PCP: [name], MD   Referred by: [name], MD  Burlene Arnt, CMA acting as scribe for Dr. Paulla Fore.  SUBJECTIVE:   Chief Complaint  Patient presents with  . Follow-up    fracture RT foot   HPI: As below and per problem based documentation when appropriate.  Joshua [name] is an established patient presenting today in follow-up of closed avulsion fracture, metatarsal bone RT foot and gout in the RT foot. He was last seen 11/25/2016 and was advised to follow Nitro Protocol, keep foot immobilized, and practice gentle ROM exercises with at rest. He was advised OK to wear post op shoe while driving.   Pt has been using nitroglycerin patches with no side effects. He has been wearing the boot most of the time while he is home. He wears the post op boot when he goes out and he has noticed more pain when wearing the post op boot. He has been doing gentle ROM exercises while at rest with no trouble. He reports continues pain, "pretty close to the same". He noticed yesterday that he developed a small knot on his RT heel.   Pt is not fasting today.     Review of Systems  Constitutional: Negative for chills and fever.  Respiratory: Negative for shortness of breath and wheezing.   Cardiovascular: Negative for chest pain and palpitations.  Gastrointestinal: Positive for heartburn.  Musculoskeletal: Positive for joint pain.  Neurological: Positive for dizziness and tingling (RT foot worse than left ). Negative for headaches.  Endo/Heme/Allergies: Does not bruise/bleed easily.  Psychiatric/Behavioral: Positive for depression.    Otherwise per HPI.  HISTORY & PERTINENT PRIOR DATA:  No specialty comments available. He reports that he quit smoking about 24 years ago.  His smoking use included Cigarettes. He has a 35.00 pack-year smoking history. He has never used smokeless tobacco. No results for input(s): HGBA1C, LABURIC in the last 8760 hours. Medications & Allergies reviewed per EMR Patient Active Problem List   Diagnosis Date Noted  . Avulsion fracture of metatarsal bone of right foot 11/25/2016  . Gout 11/20/2016  . Dyslipidemia 09/23/2016  . Obesity, Class I, BMI 30-34.9 10/03/2015  . Hereditary and idiopathic peripheral neuropathy 09/25/2014  . Allergic rhinitis 05/31/2014  . Fatigue 11/09/2013  . CKD (chronic kidney disease), stage III 10/23/2013  . Dizziness 08/23/2013  . Neuropathy ([place]) 08/21/2013  . RLS (restless legs syndrome) 01/03/2013  . Low back pain radiating to left leg 10/09/2011  . History of prostate cancer. Gleason 6 in 2012 04/01/2010  . OSTEOARTHRITIS, LOWER LEG, LEFT 10/04/2009  . TOBACCO ABUSE, HX OF 12/26/2008  . ANEMIA, IRON DEFICIENCY 11/06/2008  . 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 Essential hypertension 02/21/2007  . OSA (obstructive sleep apnea) 11/02/2006  . Hyperlipidemia 10/12/2006  . DEPRESSION 10/12/2006  . Coronary atherosclerosis 10/12/2006  . Asthma 10/12/2006  . GERD 10/12/2006  . Irritable bowel syndrome 10/12/2006   Past Medical History:  Diagnosis Date  . Acute medial meniscal tear   . Anemia   . Arthritis    "middle finger right hand; right knee; neck" (02/06/2014)  . Asthma   . 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 CAD (coronary artery disease)    CABG 2001  .  Depression   . Diverticulosis   . DJD (degenerative joint disease)   . GERD (gastroesophageal reflux disease)   . History of blood transfusion 2001   "related to the bypass OR"  . History of gout   . Hyperlipidemia   . Hypertension   . IBS (irritable bowel syndrome)   . Obesity   . OSA (obstructive sleep apnea)   . Pancreatitis ~ 1980  . Pneumonia 1980's X 2  . Prostate cancer ([place]) 04/2010    Family History  Problem Relation Age of Onset  . Heart disease Mother   . Hyperlipidemia Mother   . Heart disease Father   . Hyperlipidemia Father   . Diabetes Brother    Past Surgical History:  Procedure Laterality Date  . ANTERIOR CERVICAL DECOMP/DISCECTOMY FUSION  05/26/06  . BACK SURGERY    . CARDIAC CATHETERIZATION  11/20/1999  . CORONARY ARTERY BYPASS GRAFT  11/20/1999   SVG-RI1-RI2, SVG-OM, SVG-dRCA  . KNEE ARTHROSCOPY Right 1992; 04/2008  . LAPAROSCOPIC CHOLECYSTECTOMY  2007  . NASAL SEPTUM SURGERY Right 2007  . ROBOT ASSISTED LAPAROSCOPIC RADICAL PROSTATECTOMY  04/2010  . THYROIDECTOMY, PARTIAL  1980   Social History   Occupational History  . retired Retired   Social History Main Topics  . Smoking status: Former Smoker    Packs/day: 1.00    Years: 35.00    Types: Cigarettes    Quit date: 06/16/1992  . Smokeless tobacco: Never Used     Comment: discussed the AAA but thinks he may have had this   . Alcohol use 2.4 oz/week    4 Shots of liquor per week     Comment: mixed drink  . Drug use: No  . Sexual activity: No    OBJECTIVE:  VS:  HT:5' 6.75" (169.5 cm)   WT:211 lb 3.2 oz (95.8 kg)  BMI:33.34    BP:136/82  HR:75bpm  TEMP: ( )  RESP:97 % EXAM: Findings:    Right foot is overall significantly less swollen than in the past.  He is not focally tender over the lateral foot although he does have small amount of pain over a small deformity as well as directly over the second metatarsal.  DP PT pulses are 2+/4.  1+ pretibial edema     Dg Foot Complete Right  Result Date: 11/24/2016 CLINICAL DATA:  Right lateral foot pain.  No known injury. EXAM: RIGHT FOOT COMPLETE - 3+ VIEW COMPARISON:  None. FINDINGS: No acute bony abnormality. Specifically, no fracture, subluxation, or dislocation. Soft tissues are intact. Joint spaces are maintained. IMPRESSION: No acute bony abnormality. Electronically Signed   By: Rolm Baptise M.D.   On: 11/24/2016 14:59   [place] Limited  Joint Space Structures Low Right(no Linked Charges)  Result Date: 11/25/2016 Gerda Diss, DO     11/26/2016  8:54 AM LIMITED MSK ULTRASOUND OF right foot Images were obtained and interpreted by myself, Teresa Coombs, DO Images have been saved and stored to PACS system. Images obtained on: GE S7 Ultrasound machine FINDINGS:  There is a small amount of subcutaneous edema surrounding the base of the fifth metatarsal but this is mild.   Marked increased neovascularity within this region.   Most focal changes over the Fibularis brevis insertion on the base of the fifth metatarsal.  There is a small amount of hypoechoic change surrounding the fibularis musculature with chronic thickening of the fibularis brevis tendon.  There is a slight fragmentation of the proximal waist of the fifth metatarsal. IMPRESSION:  1. Tendon sided small avulsion fracture of the base of the fifth metatarsal/fibularis brevis (peroneous brevis) insertion injury.  (Zone 1)   ASSESSMENT & PLAN:     ICD-10-CM   1. Closed avulsion fracture of metatarsal bone of right foot with routine healing, subsequent encounter S92.301D   ================================================================= Avulsion fracture of metatarsal bone of right foot Overall doing quite well but getting frustrated with his lack of improvement.  He is using the postoperative shoe outside the house and suspect this is likely contributing to [place] of the discomfort that he is struggling with.  We will plan to have him continue with the fracture boot when he is mobile and ambulatory for at least an additional 2 weeks and will plan to see him back in 3 weeks to consider discontinuation of ankle immobilization.  Will likely need to transition into a Body Helix compression sleeve.  Continue with elevation and nitroglycerin protocol. ================================================================= There are no Patient Instructions on file for this  visit.================================================================= Future Appointments Date Time Provider [place]  12/29/2016 10:20 AM Gerda Diss, DO [facility] None  01/01/2017 2:45 PM [name], MD [facility] None  01/18/2017 8:30 AM Cameron Sprang, MD [facility] None    Follow-up: Return in about 3 weeks (around 12/29/2016).   CMA/ATC served as Education administrator during this visit. History, Physical, and Plan performed by medical provider. Documentation and orders reviewed and attested to.      Teresa Coombs, [facility] Physician

## 2016-12-08 NOTE — Assessment & Plan Note (Signed)
Overall doing quite well but getting frustrated with his lack of improvement.  He is using the postoperative shoe outside the house and suspect this is likely contributing to [place] of the discomfort that he is struggling with.  We will plan to have him continue with the fracture boot when he is mobile and ambulatory for at least an additional 2 weeks and will plan to see him back in 3 weeks to consider discontinuation of ankle immobilization.  Will likely need to transition into a Body Helix compression sleeve.  Continue with elevation and nitroglycerin protocol.

## 2016-12-11 ENCOUNTER — Other Ambulatory Visit: Payer: Self-pay

## 2016-12-11 MED ORDER — NAFTIFINE HCL 1 % EX GEL: CUTANEOUS | 0 refills | Status: DC

## 2016-12-14 ENCOUNTER — Telehealth: Payer: Self-pay | Admitting: Sports Medicine

## 2016-12-14 NOTE — Telephone Encounter (Signed)
Spoke with patient and he says that he has been worrying about his foot. He was almost in tears with pain this past Friday night. Pain has gotten a little better but he is back to wearing the post-op boot all of the time. He is scheduled to see Dr. Paulla Fore to decide if he can come out of the boot or not in about 1 week. He did twist the ankle while wearing the small boot Friday.   Pt will stay in post op boot until his scheduled visit with Dr. Paulla Fore. If he has any increase in pain level or any other accidents he will come in sooner.   OK Per Dr. Paulla Fore.

## 2016-12-14 NOTE — Telephone Encounter (Signed)
Patient needs a call to discuss still having some foot pain. Patient declined to reschedule the follow up for sooner until he hears from the nurse. Call patient to advise at [phone], okay to leave a detailed message.

## 2016-12-14 NOTE — Telephone Encounter (Signed)
Called [phone] and left VM for pt to call the office.

## 2016-12-15 DIAGNOSIS — J3089 Other allergic rhinitis: Secondary | ICD-10-CM | POA: Diagnosis not present

## 2016-12-15 DIAGNOSIS — J3081 Allergic rhinitis due to animal (cat) (dog) hair and dander: Secondary | ICD-10-CM | POA: Diagnosis not present

## 2016-12-15 DIAGNOSIS — J301 Allergic rhinitis due to pollen: Secondary | ICD-10-CM | POA: Diagnosis not present

## 2016-12-21 DIAGNOSIS — L304 Erythema intertrigo: Secondary | ICD-10-CM | POA: Diagnosis not present

## 2016-12-22 DIAGNOSIS — J3081 Allergic rhinitis due to animal (cat) (dog) hair and dander: Secondary | ICD-10-CM | POA: Diagnosis not present

## 2016-12-22 DIAGNOSIS — J301 Allergic rhinitis due to pollen: Secondary | ICD-10-CM | POA: Diagnosis not present

## 2016-12-22 DIAGNOSIS — J3089 Other allergic rhinitis: Secondary | ICD-10-CM | POA: Diagnosis not present

## 2016-12-28 ENCOUNTER — Telehealth: Payer: Self-pay | Admitting: Pharmacist Clinician (PhC)/ Clinical Pharmacy Specialist

## 2016-12-28 DIAGNOSIS — E785 Hyperlipidemia, unspecified: Secondary | ICD-10-CM

## 2016-12-28 NOTE — Telephone Encounter (Signed)
Patient just took 5th dose Repatha, wanting to know when to have labs drawn.  Advised he can come to office any time that is convenient to do a fasting lab.  Orders given to [facility]

## 2016-12-29 ENCOUNTER — Ambulatory Visit (INDEPENDENT_AMBULATORY_CARE_PROVIDER_SITE_OTHER): Payer: Medicare Other | Admitting: Sports Medicine

## 2016-12-29 ENCOUNTER — Encounter: Payer: Self-pay | Admitting: Sports Medicine

## 2016-12-29 VITALS — BP 140/78 | HR 65 | Ht 66.0 in | Wt 212.4 lb

## 2016-12-29 DIAGNOSIS — S92301D Fracture of unspecified metatarsal bone(s), right foot, subsequent encounter for fracture with routine healing: Secondary | ICD-10-CM | POA: Diagnosis not present

## 2016-12-29 DIAGNOSIS — G629 Polyneuropathy, unspecified: Secondary | ICD-10-CM | POA: Diagnosis not present

## 2016-12-29 DIAGNOSIS — M67441 Ganglion, right hand: Secondary | ICD-10-CM | POA: Insufficient documentation

## 2016-12-29 DIAGNOSIS — I2510 Atherosclerotic heart disease of native coronary artery without angina pectoris: Secondary | ICD-10-CM

## 2016-12-29 DIAGNOSIS — M79671 Pain in right foot: Secondary | ICD-10-CM

## 2016-12-29 DIAGNOSIS — I872 Venous insufficiency (chronic) (peripheral): Secondary | ICD-10-CM

## 2016-12-29 MED ORDER — DICLOFENAC SODIUM 1 % TD GEL: TRANSDERMAL | 1 refills | Status: DC

## 2016-12-29 NOTE — Progress Notes (Signed)
OFFICE VISIT NOTE [name] Treyshawn Muldrew, [facility]  SHAYAN BRAMHALL - 76 y.o. male MRN [id number]  Date of birth: [birth year]  Visit Date: 12/29/2016  PCP: [name], MD   Referred by: [name], MD  Thalia Bloodgood PT, LAT, ATC acting as scribe for Dr. Paulla Fore.  SUBJECTIVE:   Chief Complaint  Patient presents with  . Follow-up    R foot avulsion fx   HPI: As below and per problem based documentation when appropriate.  Mr. Ratterman is an established pt following up for R foot pain.  He notes that his R foot continues to hurt a little bit, rating it at a 3/10 on average.  He notes that yesterday his R foot was more painful after going for a walk, rating it as a 4-5/10.  He notes that his most pressing c/o at this point is his R shin which started to bother him after he transitioned out of his orthopedic shoe last week.    He notes that transitioning out of the orthopedic shoe was difficult due to how his R leg, knee and shin were feeling.  Pt states that he continues to use the nitroglycerin patches.  He also notes that he continues to have issues w/ his bone spur but that the padding helps w/ that.    Review of Systems  Constitutional: Negative for chills, fever and weight loss.  HENT: Negative.   Eyes: Negative.   Respiratory: Negative for cough, shortness of breath and wheezing.   Cardiovascular: Negative for chest pain and palpitations.  Gastrointestinal: Negative for abdominal pain, heartburn and nausea.  Genitourinary: Negative.   Musculoskeletal: Positive for joint pain. Negative for falls.  Neurological: Positive for headaches. Negative for dizziness and tingling.  Endo/Heme/Allergies: Bruises/bleeds easily.  Psychiatric/Behavioral: Negative for depression. The patient is not nervous/anxious and does not have insomnia.     Otherwise per HPI.  HISTORY & PERTINENT PRIOR DATA:  No specialty  comments available. He reports that he quit smoking about 24 years ago. His smoking use included Cigarettes. He has a 35.00 pack-year smoking history. He has never used smokeless tobacco. No results for input(s): HGBA1C, LABURIC in the last 8760 hours. Allergies reviewed per EMR Prior to Admission medications   Medication Sig Start Date End Date Taking? Authorizing Provider  acetaminophen (TYLENOL) 500 MG tablet Take 1,000 mg by mouth every 6 (six) hours as needed for headache (pain). Reported on 07/02/2015    [provider]  albuterol (PROAIR HFA) 108 (90 BASE) MCG/ACT inhaler Inhale 1-2 puffs into the lungs every 6 (six) hours as needed for wheezing or shortness of breath. 10/19/14   [name], MD  ammonium lactate (LAC-HYDRIN FIVE) 5 % LOTN lotion Apply 1 application topically 2 (two) times daily. 04/27/16   [name], MD  aspirin EC 81 MG tablet Take 81 mg by mouth daily.    [provider]  Cyanocobalamin (VITAMIN B 12 PO) Take 800 mg by mouth daily.    [provider]  desloratadine (CLARINEX) 5 MG tablet Take 5 mg by mouth at bedtime.     [provider]  diazepam (VALIUM) 5 MG tablet TAKE ONE TABLET BY MOUTH AT BEDTIME AS NEEDED 07/23/16   [name], MD  diclofenac sodium (VOLTAREN) 1 % GEL Apply topically to affected area qid 12/29/16   Gerda Diss, DO  Evolocumab (REPATHA SURECLICK) 540 MG/ML Darden Palmer  Inject 140 mg into the skin every 14 (fourteen) days. 09/25/16   [name], MD  FLOVENT HFA 110 MCG/ACT inhaler Inhale 2 puffs into the lungs 2 (two) times daily. Reported on 03/22/2015 07/17/14   [provider]  fluticasone (FLONASE) 50 MCG/ACT nasal spray Place 1 spray into both nostrils 2 (two) times daily.  09/05/12   Ricard Dillon, MD  folic acid (FOLVITE) 161 MCG tablet Take 400 mcg by mouth daily.    [provider]  gabapentin (NEURONTIN) 100 MG capsule Take 1 cap in AM, 5 caps in PM 10/26/16   Cameron Sprang,  MD  hydrochlorothiazide (HYDRODIURIL) 25 MG tablet Take 1 tablet (25 mg total) by mouth daily. 10/15/16   [name], MD  irbesartan (AVAPRO) 300 MG tablet Take 1 tablet (300 mg total) by mouth daily. 10/15/16   [name], MD  ketoconazole (NIZORAL) 2 % cream Apply 1 application topically daily.    [provider]  MYRBETRIQ 25 MG TB24 tablet  06/19/16   [provider]  Naftifine HCl 1 % GEL Apply to affected area twice a day for up to 4 weeks. 12/11/16   [name], MD  nitroGLYCERIN (NITRODUR - DOSED IN MG/24 HR) 0.2 mg/hr patch Place 1/4 to 1/2 of a patch over affected region. Remove and replace once daily.  Slightly alter skin placement daily 11/25/16   Gerda Diss, DO  nystatin (MYCOSTATIN/NYSTOP) powder Apply topically 4 (four) times daily.    [provider]  nystatin ointment (MYCOSTATIN) Apply 1 application topically 2 (two) times daily.    [provider]  omeprazole (PRILOSEC) 20 MG capsule TAKE ONE CAPSULE BY MOUTH ONCE DAILY AS NEEDED FOR  ACID  REFLUX/HEARTBURN 02/25/16   [name], MD  polyethylene glycol powder (GLYCOLAX/MIRALAX) powder Take 17 g by mouth daily. 08/29/14   Tanna Furry, MD  Probiotic Product (ALIGN) 4 MG CAPS Take 4 mg by mouth daily.     [provider]  simvastatin (ZOCOR) 40 MG tablet Take 1 tablet (40 mg total) by mouth at bedtime. 06/18/16   [name], MD   Patient Active Problem List   Diagnosis Date Noted  . Venous insufficiency 12/29/2016  . Ganglion cyst of tendon sheath of right hand 12/29/2016  . Avulsion fracture of metatarsal bone of right foot 11/25/2016  . Gout 11/20/2016  . Dyslipidemia 09/23/2016  . Obesity, Class I, BMI 30-34.9 10/03/2015  . Hereditary and idiopathic peripheral neuropathy 09/25/2014  . Allergic rhinitis 05/31/2014  . Fatigue 11/09/2013  . CKD (chronic kidney disease), stage III ([place]) 10/23/2013  . Dizziness 08/23/2013  . Neuropathy ([place])  08/21/2013  . RLS (restless legs syndrome) 01/03/2013  . Low back pain radiating to left leg 10/09/2011  . History of prostate cancer. Gleason 6 in 2012 04/01/2010  . OSTEOARTHRITIS, LOWER LEG, LEFT 10/04/2009  . TOBACCO ABUSE, HX OF 12/26/2008  . ANEMIA, IRON DEFICIENCY 11/06/2008  . 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 Essential hypertension 02/21/2007  . OSA (obstructive sleep apnea) 11/02/2006  . Hyperlipidemia 10/12/2006  . DEPRESSION 10/12/2006  . Coronary atherosclerosis 10/12/2006  . Asthma 10/12/2006  . GERD 10/12/2006  . Irritable bowel syndrome 10/12/2006   Past Medical History:  Diagnosis Date  . Acute medial meniscal tear   . Anemia   . Arthritis    "middle finger right hand; right knee; neck" (02/06/2014)  . Asthma   . 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 CAD (coronary artery disease)    CABG 2001  . Depression   . Diverticulosis   . DJD (degenerative joint disease)   . GERD (gastroesophageal reflux disease)   . History of blood transfusion 2001   "related to the bypass OR"  . History of gout   . Hyperlipidemia   . Hypertension   . IBS (irritable bowel syndrome)   . Obesity   . OSA (obstructive sleep apnea)   . Pancreatitis ~ 1980  . Pneumonia 1980's X 2  . Prostate cancer ([place]) 04/2010   Family History  Problem Relation Age of Onset  . Heart disease Mother   . Hyperlipidemia Mother   . Heart disease Father   . Hyperlipidemia Father   . Diabetes Brother    Past Surgical History:  Procedure Laterality Date  . ANTERIOR CERVICAL DECOMP/DISCECTOMY FUSION  05/26/06  . BACK SURGERY    . CARDIAC CATHETERIZATION  11/20/1999  . CORONARY ARTERY BYPASS GRAFT  11/20/1999   SVG-RI1-RI2, SVG-OM, SVG-dRCA  . KNEE ARTHROSCOPY Right 1992; 04/2008  . LAPAROSCOPIC CHOLECYSTECTOMY  2007  . NASAL SEPTUM SURGERY Right 2007  . ROBOT ASSISTED LAPAROSCOPIC RADICAL PROSTATECTOMY  04/2010  . THYROIDECTOMY, PARTIAL  1980   Social History     Occupational History  . retired Retired   Social History Main Topics  . Smoking status: Former Smoker    Packs/day: 1.00    Years: 35.00    Types: Cigarettes    Quit date: 06/16/1992  . Smokeless tobacco: Never Used     Comment: discussed the AAA but thinks he may have had this   . Alcohol use 2.4 oz/week    4 Shots of liquor per week     Comment: mixed drink  . Drug use: No  . Sexual activity: No    OBJECTIVE:  VS:  HT:5\' 6"  (167.6 cm)   WT:212 lb 6.4 oz (96.3 kg)  BMI:34.3    BP:140/78  HR:65bpm  TEMP: ( )  RESP:95 % EXAM: Findings:  Right hand: small dorsal sided tendinous ganglion that is enlarged but non-tense and only mildly painful to palpation.   Right foot: Overall significantly better.  He does continue to have some focal pain over the anterior aspect of the base of the fifth metatarsal.  Pain is worse along the path of the peroneus tertius.  No pain along the posterior aspect of the medial malleolus.  Does have a small amount of pain with palpation of the anterior compartment but this is minimal.  It is soft. 1+ pitting edema on the left and 2+ pitting edema on the right leg.    RADIOLOGY: [place] LIMITED JOINT SPACE STRUCTURES LOW RIGHT(NO LINKED CHARGES) Gerda Diss, DO     11/26/2016  8:54 AM LIMITED MSK ULTRASOUND OF right foot  Images were obtained and interpreted by myself, Teresa Coombs, DO   Images have been saved and stored to PACS system. Images obtained on: GE S7 Ultrasound machine  FINDINGS:   There is a small amount of subcutaneous edema surrounding the  base of the fifth metatarsal but this is mild.    Marked increased neovascularity within this region.    Most focal changes over the Fibularis brevis insertion on the  base of the fifth metatarsal.  There is a small amount of hypoechoic change surrounding the  fibularis musculature with chronic thickening of the fibularis  brevis tendon.  There is a slight fragmentation of the proximal  waist of the  fifth metatarsal.  IMPRESSION:  1. Tendon sided small avulsion fracture of the base of the fifth  metatarsal/fibularis brevis (peroneous brevis) insertion injury.   (Zone 1)  ASSESSMENT & PLAN:     ICD-10-CM   1. Right foot pain M79.671   2. Neuropathy ([place]) G62.9   3. Venous insufficiency I87.2   4. Ganglion cyst of tendon sheath of right hand M67.441   5. Closed avulsion fracture of metatarsal bone of right foot with routine healing, subsequent encounter S92.301D    ================================================================= Ganglion cyst of tendon sheath of right hand    Subjective Report:  Patient also reports a right dorsal hand ganglion that is been present for the past several months and intermittently worsens.  Occasionally painful.  No surrounding erythema or exudate.     Assessment & Plan & Follow up Issues:  New condition  -  1. Trial of voltaren gel 2. Discussed options for injection/excision and will continue conservative care      Avulsion fracture of metatarsal bone of right foot Suspect this is actually more of a Proteus tertius avulsion injury given the anterior compartment involvement.  He has only a small amount of swelling over this area and overall good improvement with palpation.  Continues to have some pain with weightbearing.  Recommend compression and continuing with nitroglycerin protocol as this should be helping speed the healing to the tendon.  We will follow-up in 4 weeks to ensure clinical resolution if any lack of improvement could consider Body Helix but I would like to see if he can tolerate the compression socks in general which may also help with his underlying neuropathy given early experimental findings  Neuropathy ([place]) VIVE Wear Compression (Duda Sock) socks with microcurrent technology may be helpful for  coping with compression discomfort that he has previously experienced.  Prescription provided today for brand-name only  socks    Follow-up: Return in about 4 weeks (around 01/26/2017).   CMA/ATC served as Education administrator during this visit. History, Physical, and Plan performed by medical provider. Documentation and orders reviewed and attested to.      Teresa Coombs, [facility] Physician

## 2016-12-29 NOTE — Assessment & Plan Note (Signed)
Suspect this is actually more of a Proteus tertius avulsion injury given the anterior compartment involvement.  He has only a small amount of swelling over this area and overall good improvement with palpation.  Continues to have some pain with weightbearing.  Recommend compression and continuing with nitroglycerin protocol as this should be helping speed the healing to the tendon.  We will follow-up in 4 weeks to ensure clinical resolution if any lack of improvement could consider Body Helix but I would like to see if he can tolerate the compression socks in general which may also help with his underlying neuropathy given early experimental findings

## 2016-12-29 NOTE — Assessment & Plan Note (Signed)
VIVE Wear Compression (Duda Sock) socks with microcurrent technology may be helpful for  coping with compression discomfort that he has previously experienced.  Prescription provided today for brand-name only socks

## 2016-12-29 NOTE — Assessment & Plan Note (Addendum)
Subjective Report:  Patient also reports a right dorsal hand ganglion that is been present for the past several months and intermittently worsens.  Occasionally painful.  No surrounding erythema or exudate.     Assessment & Plan & Follow up Issues:  New condition  -  1. Trial of voltaren gel 2. Discussed options for injection/excision and will continue conservative care

## 2016-12-30 DIAGNOSIS — E785 Hyperlipidemia, unspecified: Secondary | ICD-10-CM | POA: Diagnosis not present

## 2016-12-30 LAB — LIPID PANEL
Chol/HDL Ratio: 1.9 ratio (ref 0.0–5.0)
Cholesterol, Total: 81 mg/dL — ABNORMAL LOW (ref 100–199)
HDL: 42 mg/dL (ref >39)
LDL Calculated: 9 mg/dL (ref 0–99)
Triglycerides: 151 mg/dL — ABNORMAL HIGH (ref 0–149)
VLDL Cholesterol Cal: 30 mg/dL (ref 5–40)

## 2016-12-30 LAB — HEPATIC FUNCTION PANEL
ALT: 27 IU/L (ref 0–44)
AST: 23 IU/L (ref 0–40)
Albumin: 4.3 g/dL (ref 3.5–4.8)
Alkaline Phosphatase: 63 IU/L (ref 39–117)
Bilirubin Total: 0.7 mg/dL (ref 0.0–1.2)
Bilirubin, Direct: 0.25 mg/dL (ref 0.00–0.40)
Total Protein: 6.7 g/dL (ref 6.0–8.5)

## 2016-12-31 ENCOUNTER — Telehealth: Payer: Self-pay | Admitting: Pharmacist

## 2016-12-31 DIAGNOSIS — J3081 Allergic rhinitis due to animal (cat) (dog) hair and dander: Secondary | ICD-10-CM | POA: Diagnosis not present

## 2016-12-31 DIAGNOSIS — J301 Allergic rhinitis due to pollen: Secondary | ICD-10-CM | POA: Diagnosis not present

## 2016-12-31 DIAGNOSIS — J3089 Other allergic rhinitis: Secondary | ICD-10-CM | POA: Diagnosis not present

## 2016-12-31 DIAGNOSIS — J4530 Mild persistent asthma, uncomplicated: Secondary | ICD-10-CM | POA: Diagnosis not present

## 2016-12-31 NOTE — Telephone Encounter (Signed)
LDL from 76mg /dl to 9mg /dl on Repatha 140mg   Patient denies ADRs or problems with current therapy.  He will bring [employer] renewal paperwork in November.

## 2017-01-01 ENCOUNTER — Ambulatory Visit (INDEPENDENT_AMBULATORY_CARE_PROVIDER_SITE_OTHER): Payer: Medicare Other | Admitting: Family Medicine

## 2017-01-01 ENCOUNTER — Encounter: Payer: Self-pay | Admitting: Family Medicine

## 2017-01-01 VITALS — BP 102/62 | HR 75 | Temp 97.5°F | Ht 66.0 in | Wt 212.4 lb

## 2017-01-01 DIAGNOSIS — E785 Hyperlipidemia, unspecified: Secondary | ICD-10-CM | POA: Diagnosis not present

## 2017-01-01 DIAGNOSIS — K219 Gastro-esophageal reflux disease without esophagitis: Secondary | ICD-10-CM

## 2017-01-01 DIAGNOSIS — I2510 Atherosclerotic heart disease of native coronary artery without angina pectoris: Secondary | ICD-10-CM | POA: Diagnosis not present

## 2017-01-01 DIAGNOSIS — I10 Essential (primary) hypertension: Secondary | ICD-10-CM | POA: Diagnosis not present

## 2017-01-01 DIAGNOSIS — Z23 Encounter for immunization: Secondary | ICD-10-CM | POA: Diagnosis not present

## 2017-01-01 DIAGNOSIS — J4540 Moderate persistent asthma, uncomplicated: Secondary | ICD-10-CM

## 2017-01-01 MED ORDER — DIAZEPAM 5 MG PO TABS: 5.0000 mg | ORAL_TABLET | Freq: Every evening | ORAL | 2 refills | Status: DC | PRN

## 2017-01-01 NOTE — Patient Instructions (Addendum)
Hope the foot continues to improve  Thanks for getting your flu shot  Blood pressure looks great. Glad reflux is doing ok on zantac

## 2017-01-01 NOTE — Assessment & Plan Note (Signed)
S: asthma doing ok on flovent. Rare albuterol A/P: refilled albuterol

## 2017-01-01 NOTE — Progress Notes (Signed)
Subjective:  Joshua [name] is a 76 y.o. year old very pleasant male patient who presents for/with See problem oriented charting ROS- no chest pain. No worsening edema. Continued foot pain.  No recent wheezing.   Past Medical History-  Patient Active Problem List   Diagnosis Date Noted  . Coronary atherosclerosis 10/12/2006    Priority: High  . Irritable bowel syndrome 10/12/2006    Priority: High  . CKD (chronic kidney disease), stage III ([place]) 10/23/2013    Priority: Medium  . Neuropathy ([place]) 08/21/2013    Priority: Medium  . History of prostate cancer. Gleason 6 in 2012 04/01/2010    Priority: Medium  . Essential hypertension 02/21/2007    Priority: Medium  . Hyperlipidemia 10/12/2006    Priority: Medium  . DEPRESSION 10/12/2006    Priority: Medium  . Asthma 10/12/2006    Priority: Medium  . Obesity, Class I, BMI 30-34.9 10/03/2015    Priority: Low  . Allergic rhinitis 05/31/2014    Priority: Low  . Fatigue 11/09/2013    Priority: Low  . Dizziness 08/23/2013    Priority: Low  . RLS (restless legs syndrome) 01/03/2013    Priority: Low  . Low back pain radiating to left leg 10/09/2011    Priority: Low  . OSTEOARTHRITIS, LOWER LEG, LEFT 10/04/2009    Priority: Low  . TOBACCO ABUSE, HX OF 12/26/2008    Priority: Low  . ANEMIA, IRON DEFICIENCY 11/06/2008    Priority: Low  . 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 OSA (obstructive sleep apnea) 11/02/2006    Priority: Low  . GERD 10/12/2006    Priority: Low  . Venous insufficiency 12/29/2016  . Ganglion cyst of tendon sheath of right hand 12/29/2016  . Avulsion fracture of metatarsal bone of right foot 11/25/2016  . Gout 11/20/2016  . Dyslipidemia 09/23/2016  . Hereditary and idiopathic peripheral neuropathy 09/25/2014    Medications- reviewed and updated Current Outpatient Prescriptions  Medication Sig Dispense Refill  . acetaminophen (TYLENOL) 500 MG tablet Take 1,000 mg by mouth every  6 (six) hours as needed for headache (pain). Reported on 07/02/2015    . albuterol (PROAIR HFA) 108 (90 BASE) MCG/ACT inhaler Inhale 1-2 puffs into the lungs every 6 (six) hours as needed for wheezing or shortness of breath. 18 g 3  . ammonium lactate (LAC-HYDRIN FIVE) 5 % LOTN lotion Apply 1 application topically 2 (two) times daily. 340 mL 2  . aspirin EC 81 MG tablet Take 81 mg by mouth daily.    . Cyanocobalamin (VITAMIN B 12 PO) Take 800 mg by mouth daily.    [name] desloratadine (CLARINEX) 5 MG tablet Take 5 mg by mouth at bedtime.     . diazepam (VALIUM) 5 MG tablet Take 1 tablet (5 mg total) by mouth at bedtime as needed. 30 tablet 2  . Evolocumab (REPATHA SURECLICK) 673 MG/ML SOAJ Inject 140 mg into the skin every 14 (fourteen) days. 2 pen 11  . FLOVENT HFA 110 MCG/ACT inhaler Inhale 2 puffs into the lungs 2 (two) times daily. Reported on 03/22/2015  5  . fluticasone (FLONASE) 50 MCG/ACT nasal spray Place 1 spray into both nostrils 2 (two) times daily.     . folic acid (FOLVITE) 419 MCG tablet Take 400 mcg by mouth daily.    [name] gabapentin (NEURONTIN) 100 MG capsule Take 1 cap in AM, 5 caps in PM 180 capsule 11  . hydrochlorothiazide (HYDRODIURIL) 25 MG tablet Take 1 tablet (  25 mg total) by mouth daily. 90 tablet 1  . hydrocortisone 2.5 % cream     . irbesartan (AVAPRO) 300 MG tablet Take 1 tablet (300 mg total) by mouth daily. 90 tablet 1  . nitroGLYCERIN (NITRODUR - DOSED IN MG/24 HR) 0.2 mg/hr patch Place 1/4 to 1/2 of a patch over affected region. Remove and replace once daily.  Slightly alter skin placement daily 30 patch 1  . polyethylene glycol powder (GLYCOLAX/MIRALAX) powder Take 17 g by mouth daily. 255 g 0  . Probiotic Product (ALIGN) 4 MG CAPS Take 4 mg by mouth daily.     . simvastatin (ZOCOR) 40 MG tablet Take 1 tablet (40 mg total) by mouth at bedtime. 90 tablet 3  . diclofenac sodium (VOLTAREN) 1 % GEL Apply topically to affected area qid (Patient not taking: Reported on  01/01/2017) 100 g 1   No current facility-administered medications for this visit.     Objective: BP 102/62 (BP Location: Left Arm, Patient Position: Sitting, Cuff Size: Large)   Pulse 75   Temp (!) 97.5 F (36.4 C) (Oral)   Ht 5\' 6"  (1.676 m)   Wt 212 lb 6.4 oz (96.3 kg)   SpO2 96%   BMI 34.28 kg/m  Gen: NAD, resting comfortably CV: RRR no murmurs rubs or gallops Lungs: CTAB no crackles, wheeze, rhonchi Abdomen: soft/nontender/nondistended/normal bowel sounds. No rebound or guarding.  Ext: no edema  Assessment/Plan:  Hyperlipidemia S: very well controlled on repatha and simvastatin 40mg - injections at home twice a month.  Lab Results  Component Value Date   CHOL 81 (L) 12/30/2016   HDL 42 12/30/2016   LDLCALC 9 12/30/2016   TRIG 151 (H) 12/30/2016   CHOLHDL 1.9 12/30/2016   A/P: continue current medications  Essential hypertension S: controlled on irbesartan 300mg  and hctz 25mg .  BP Readings from Last 3 Encounters:  01/01/17 102/62  12/29/16 140/78  12/08/16 136/82  A/P: We discussed blood pressure goal of <140/90. Continue current meds  GERD S: switched to zantac twice a day 2 months ago due to concerns of patient about kidney disease and other issues with long term PPI. He has done very well with thsi A/P: continue current medications  Asthma S: asthma doing ok on flovent. Rare albuterol A/P: refilled albuterol   Future Appointments Date Time Provider [place]  01/18/2017 8:30 AM Cameron Sprang, MD [facility] None  01/26/2017 10:20 AM Gerda Diss, DO [facility] None  04/05/2017 9:00 AM [name], MD [facility]   Return in about 3 months (around 04/03/2017) for follow up- or sooner if needed.  Orders Placed This Encounter  Procedures  . Flu vaccine HIGH DOSE PF   Meds ordered this encounter  Medications  . hydrocortisone 2.5 % cream  . diazepam (VALIUM) 5 MG tablet    Sig: Take 1 tablet (5 mg total) by mouth at bedtime as  needed.    Dispense:  30 tablet    Refill:  2    Return precautions advised.  [name], MD

## 2017-01-01 NOTE — Assessment & Plan Note (Signed)
S: switched to zantac twice a day 2 months ago due to concerns of patient about kidney disease and other issues with long term PPI. He has done very well with thsi A/P: continue current medications

## 2017-01-01 NOTE — Assessment & Plan Note (Signed)
S: controlled on irbesartan 300mg  and hctz 25mg .  BP Readings from Last 3 Encounters:  01/01/17 102/62  12/29/16 140/78  12/08/16 136/82  A/P: We discussed blood pressure goal of <140/90. Continue current meds

## 2017-01-01 NOTE — Assessment & Plan Note (Signed)
S: very well controlled on repatha and simvastatin 40mg - injections at home twice a month.  Lab Results  Component Value Date   CHOL 81 (L) 12/30/2016   HDL 42 12/30/2016   LDLCALC 9 12/30/2016   TRIG 151 (H) 12/30/2016   CHOLHDL 1.9 12/30/2016   A/P: continue current medications

## 2017-01-05 DIAGNOSIS — J3089 Other allergic rhinitis: Secondary | ICD-10-CM | POA: Diagnosis not present

## 2017-01-05 DIAGNOSIS — J3081 Allergic rhinitis due to animal (cat) (dog) hair and dander: Secondary | ICD-10-CM | POA: Diagnosis not present

## 2017-01-05 DIAGNOSIS — J301 Allergic rhinitis due to pollen: Secondary | ICD-10-CM | POA: Diagnosis not present

## 2017-01-08 ENCOUNTER — Encounter: Payer: Self-pay | Admitting: Family Medicine

## 2017-01-08 ENCOUNTER — Ambulatory Visit (INDEPENDENT_AMBULATORY_CARE_PROVIDER_SITE_OTHER): Payer: Medicare Other | Admitting: Family Medicine

## 2017-01-08 VITALS — BP 90/60 | HR 75 | Ht 66.0 in | Wt 210.2 lb

## 2017-01-08 DIAGNOSIS — I10 Essential (primary) hypertension: Secondary | ICD-10-CM

## 2017-01-08 DIAGNOSIS — R42 Dizziness and giddiness: Secondary | ICD-10-CM | POA: Diagnosis not present

## 2017-01-08 MED ORDER — MECLIZINE HCL 25 MG PO TABS: 25.0000 mg | ORAL_TABLET | Freq: Three times a day (TID) | ORAL | 0 refills | Status: DC | PRN

## 2017-01-08 MED ORDER — ONDANSETRON 8 MG PO TBDP: 8.0000 mg | ORAL_TABLET | Freq: Three times a day (TID) | ORAL | 0 refills | Status: DC | PRN

## 2017-01-08 NOTE — Patient Instructions (Signed)
STOP the HCTZ. Let me or Dr Yong Channel know if your blood pressure stays below 100 or if it increases to more than 150/90.  Start the meclizine and zofran.  Let me know if your symptoms are not improving or worsening.  Take care,  Dr Jerline Pain   Benign Positional Vertigo Vertigo is the feeling that you or your surroundings are moving when they are not. Benign positional vertigo is the most common form of vertigo. The cause of this condition is not serious (is benign). This condition is triggered by certain movements and positions (is positional). This condition can be dangerous if it occurs while you are doing something that could endanger you or others, such as driving. What are the causes? In many cases, the cause of this condition is not known. It may be caused by a disturbance in an area of the inner ear that helps your brain to sense movement and balance. This disturbance can be caused by a viral infection (labyrinthitis), head injury, or repetitive motion. What increases the risk? This condition is more likely to develop in:  Women.  People who are 71 years of age or older.  What are the signs or symptoms? Symptoms of this condition usually happen when you move your head or your eyes in different directions. Symptoms may start suddenly, and they usually last for less than a minute. Symptoms may include:  Loss of balance and falling.  Feeling like you are spinning or moving.  Feeling like your surroundings are spinning or moving.  Nausea and vomiting.  Blurred vision.  Dizziness.  Involuntary eye movement (nystagmus).  Symptoms can be mild and cause only slight annoyance, or they can be severe and interfere with daily life. Episodes of benign positional vertigo may return (recur) over time, and they may be triggered by certain movements. Symptoms may improve over time. How is this diagnosed? This condition is usually diagnosed by medical history and a physical exam of the head,  neck, and ears. You may be referred to a health care provider who specializes in ear, nose, and throat (ENT) problems (otolaryngologist) or a provider who specializes in disorders of the nervous system (neurologist). You may have additional testing, including:  MRI.  A CT scan.  Eye movement tests. Your health care provider may ask you to change positions quickly while he or she watches you for symptoms of benign positional vertigo, such as nystagmus. Eye movement may be tested with an electronystagmogram (ENG), caloric stimulation, the Dix-Hallpike test, or the roll test.  An electroencephalogram (EEG). This records electrical activity in your brain.  Hearing tests.  How is this treated? Usually, your health care provider will treat this by moving your head in specific positions to adjust your inner ear back to normal. Surgery may be needed in severe cases, but this is rare. In some cases, benign positional vertigo may resolve on its own in 2-4 weeks. Follow these instructions at home: Safety  Move slowly.Avoid sudden body or head movements.  Avoid driving.  Avoid operating heavy machinery.  Avoid doing any tasks that would be dangerous to you or others if a vertigo episode would occur.  If you have trouble walking or keeping your balance, try using a cane for stability. If you feel dizzy or unstable, sit down right away.  Return to your normal activities as told by your health care provider. Ask your health care provider what activities are safe for you. General instructions  Take over-the-counter and prescription medicines only as  told by your health care provider.  Avoid certain positions or movements as told by your health care provider.  Drink enough fluid to keep your urine clear or pale yellow.  Keep all follow-up visits as told by your health care provider. This is important. Contact a health care provider if:  You have a fever.  Your condition gets worse or you  develop new symptoms.  Your family or friends notice any behavioral changes.  Your nausea or vomiting gets worse.  You have numbness or a "pins and needles" sensation. Get help right away if:  You have difficulty speaking or moving.  You are always dizzy.  You faint.  You develop severe headaches.  You have weakness in your legs or arms.  You have changes in your hearing or vision.  You develop a stiff neck.  You develop sensitivity to light. This information is not intended to replace advice given to you by your health care provider. Make sure you discuss any questions you have with your health care provider. Document Released: 12/15/2005 Document Revised: 08/15/2015 Document Reviewed: 07/02/2014 Elsevier Interactive Patient Education  [employer].

## 2017-01-08 NOTE — Progress Notes (Signed)
   Subjective:  Joshua [name] is a 76 y.o. male who presents today with a chief complaint of dizziness.   HPI:  Dizziness, acute issue Symptoms started yesterday.  Reports that he was bent over and then stood up and noticed dizziness.  Describes a slight room spinning sensation.  He has a history of vertigo, however reports that this is slightly different than his past episodes.  No associated nausea.  No vomiting.  Dizziness is worse with standing and head movement.  No recent illnesses.  Some generalized weakness, however no focal weakness.  No numbness.  No medications tried.  No dysarthria or vision changes.  No hearing loss.  No syncope. ROS: Per HPI  PMH: Smoking history reviewed.  Former smoker  Objective:  Physical Exam: BP 90/60   Pulse 75   Ht 5\' 6"  (1.676 m)   Wt 210 lb 3.2 oz (95.3 kg)   SpO2 98%   BMI 33.93 kg/m    Orthostatic VS for the past 24 hrs (Last 3 readings):  BP- Sitting Pulse- Sitting BP- Standing at 0 minutes Pulse- Standing at 0 minutes BP- Standing at 3 minutes Pulse- Standing at 3 minutes  01/08/17 1005 90/60 75 90/60 79 100/80 98   Gen: NAD, resting comfortably HEENT: Hearing aids in place.  EAC and TMs clear bilaterally.  Hearing grossly intact.  Extraocular eye movements intact. CV: RRR with no murmurs appreciated Pulm: NWOB, CTAB with no crackles, wheezes, or rhonchi Neuro: CN II through XII intact.  Finger-nose-finger testing intact bilaterally.  Strength 5 out of 5 in upper and lower extremities.  Sensation to light touch grossly intact throughout.  Dix-Hallpike maneuver deferred. Psych: Normal affect and thought content  Assessment/Plan:  Dizziness Symptoms consistent with peripheral vertigo.  Patient also orthostatic with low blood pressure today (see problem below).  No red flag signs or symptoms concerning for central lesion.  Start meclizine and Zofran.  Discussed Epley maneuver and other home exercises.  Offered vestibular rehab, however  patient deferred.  Return precautions reviewed.  Follow-up as needed.  Essential hypertension Low today with orthostatic hypotension -this is likely contributing to his dizziness.  Stop HCTZ.  Patient and wife will continue to check over the next few days.  They will contact office if persistently lower than 100/60 or if persistently elevated greater than 140/90.  Algis Greenhouse. Jerline Pain, MD 01/08/2017 11:01 AM

## 2017-01-08 NOTE — Assessment & Plan Note (Addendum)
Low today with orthostatic hypotension -this is likely contributing to his dizziness.  Stop HCTZ.  Patient and wife will continue to check over the next few days.  They will contact office if persistently lower than 100/60 or if persistently elevated greater than 140/90.

## 2017-01-13 ENCOUNTER — Telehealth: Payer: Self-pay | Admitting: Family Medicine

## 2017-01-13 NOTE — Telephone Encounter (Signed)
noted 

## 2017-01-13 NOTE — Telephone Encounter (Signed)
Patient is seeing Dr. Yong Channel tomorrow at 1pm for vertigo.

## 2017-01-14 ENCOUNTER — Ambulatory Visit: Payer: Medicare Other | Admitting: Family Medicine

## 2017-01-18 ENCOUNTER — Ambulatory Visit (INDEPENDENT_AMBULATORY_CARE_PROVIDER_SITE_OTHER): Payer: Medicare Other | Admitting: Neurology

## 2017-01-18 ENCOUNTER — Encounter: Payer: Self-pay | Admitting: Neurology

## 2017-01-18 VITALS — BP 120/68 | HR 80 | Ht 67.0 in | Wt 212.0 lb

## 2017-01-18 DIAGNOSIS — M542 Cervicalgia: Secondary | ICD-10-CM

## 2017-01-18 DIAGNOSIS — I2510 Atherosclerotic heart disease of native coronary artery without angina pectoris: Secondary | ICD-10-CM

## 2017-01-18 DIAGNOSIS — G609 Hereditary and idiopathic neuropathy, unspecified: Secondary | ICD-10-CM | POA: Diagnosis not present

## 2017-01-18 MED ORDER — GABAPENTIN 100 MG PO CAPS: ORAL_CAPSULE | ORAL | 11 refills | Status: DC

## 2017-01-18 NOTE — Patient Instructions (Signed)
1. Schedule cervical xray for neck pain 2. Refer to PT for neck pain 3. Continue gabapentin 1 cap in AM, 1 cap in PM, 4 caps at bedtime 4. Follow-up in 6 months, call for any changes
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
## 2017-01-19 DIAGNOSIS — J3089 Other allergic rhinitis: Secondary | ICD-10-CM | POA: Diagnosis not present

## 2017-01-19 DIAGNOSIS — J3081 Allergic rhinitis due to animal (cat) (dog) hair and dander: Secondary | ICD-10-CM | POA: Diagnosis not present

## 2017-01-19 DIAGNOSIS — J301 Allergic rhinitis due to pollen: Secondary | ICD-10-CM | POA: Diagnosis not present

## 2017-01-20 ENCOUNTER — Ambulatory Visit: Payer: Medicare Other | Attending: Neurology

## 2017-01-20 DIAGNOSIS — M542 Cervicalgia: Secondary | ICD-10-CM | POA: Diagnosis not present

## 2017-01-20 DIAGNOSIS — R293 Abnormal posture: Secondary | ICD-10-CM | POA: Insufficient documentation

## 2017-01-20 DIAGNOSIS — M62838 Other muscle spasm: Secondary | ICD-10-CM | POA: Insufficient documentation

## 2017-01-20 NOTE — Patient Instructions (Addendum)
PERFORM ALL EXERCISES GENTLY AND WITH GOOD POSTURE.    20 SECOND HOLD, 3 REPS TO EACH SIDE. 4-5 TIMES EACH DAY.   AROM: Neck Rotation   Turn head slowly to look over one shoulder, then the other.   AROM: Neck Flexion   Bend head forward.   AROM: Lateral Neck Flexion   Slowly tilt head toward one shoulder, then the other.    Posture - Standing   Good posture is important. Avoid slouching and forward head thrust. Maintain curve in low back and align ears over shoulders, hips over ankles.  Pull your belly button in toward your back bone. Posture Tips DO: - stand tall and erect - keep chin tucked in - keep head and shoulders in alignment - check posture regularly in mirror or large window - pull head back against headrest in car seat;  Change your position often.  Sit with lumbar support. DON'T: - slouch or slump while watching TV or reading - sit, stand or lie in one position  for too long;  Sitting is especially hard on the spine so if you sit at a desk/use the computer, then stand up often! Copyright  VHI. All rights reserved.  Posture - Sitting  Sit upright, head facing forward. Try using a roll to support lower back. Keep shoulders relaxed, and avoid rounded back. Keep hips level with knees. Avoid crossing legs for long periods. Copyright  VHI. All rights reserved.  Chronic neck strain can develop because of poor posture and faulty work habits  Postural strain related to slumped sitting and forward head posture is a leading cause of headaches, neck and upper back pain  General strengthening and flexibility exercises are helpful in the treatment of neck pain.  Most importantly, you should learn to correct the posture that may be contributing to chronic pain.   Change positions frequently  Change your work or home environment to improve posture and mechanics.   [facility] [address], [address] [place], [place] [address] Phone # [phone] Fax  [phone] 

## 2017-01-20 NOTE — Therapy (Addendum)
[facility] [address] [place], [place], [address] Phone: [phone]   Fax:  [phone]  Physical Therapy Evaluation  Patient Details  Name: Joshua [name] MRN: [id number] Date of Birth: 01/18/1941 Referring Provider: Ellouise Newer, MD  Encounter Date: 01/20/2017      PT End of Session - 01/20/17 1056    Visit Number 1   Number of Visits 10   Date for PT Re-Evaluation 03/17/17   PT Start Time 1021   PT Stop Time 1058   PT Time Calculation (min) 37 min   Activity Tolerance Patient tolerated treatment well   Behavior During Therapy [facility] for tasks assessed/performed      Past Medical History:  Diagnosis Date  . Acute medial meniscal tear   . Anemia   . Arthritis    "middle finger right hand; right knee; neck" (02/06/2014)  . Asthma   . Bladder cancer ([place]) 04/2010   "cauterized during prostate OR"  . CAD (coronary artery disease)    CABG 2001  . Depression   . Diverticulosis   . DJD (degenerative joint disease)   . GERD (gastroesophageal reflux disease)   . History of blood transfusion 2001   "related to the bypass OR"  . History of gout   . Hyperlipidemia   . Hypertension   . IBS (irritable bowel syndrome)   . Obesity   . OSA (obstructive sleep apnea)   . Pancreatitis ~ 1980  . Pneumonia 1980's X 2  . Prostate cancer ([place]) 04/2010    Past Surgical History:  Procedure Laterality Date  . ANTERIOR CERVICAL DECOMP/DISCECTOMY FUSION  05/26/06  . BACK SURGERY    . CARDIAC CATHETERIZATION  11/20/1999  . CORONARY ARTERY BYPASS GRAFT  11/20/1999   SVG-RI1-RI2, SVG-OM, SVG-dRCA  . KNEE ARTHROSCOPY Right 1992; 04/2008  . LAPAROSCOPIC CHOLECYSTECTOMY  2007  . NASAL SEPTUM SURGERY Right 2007  . ROBOT ASSISTED LAPAROSCOPIC RADICAL PROSTATECTOMY  04/2010  . THYROIDECTOMY, PARTIAL  1980    There were no vitals filed for this visit.       Subjective Assessment - 01/20/17 1024    Subjective Pt  presents to PT with 1 month history of neck pain.  Pt had neck surgery in 2007.     Pertinent History neck surgery: 2007, Rt foot fracture 6 weeks ago   How long can you stand comfortably? limited by Rt foot   How long can you walk comfortably? limited by Rt foot   Diagnostic tests ordered by MD   Patient Stated Goals reduce neck pain   Currently in Pain? Yes   Pain Score 4   up to 7-8/10   Pain Location Neck   Pain Orientation Right   Pain Descriptors / Indicators Burning;Aching;Shooting   Pain Type Acute pain   Pain Onset More than a month ago   Pain Frequency Constant   Aggravating Factors  lying in bed   Pain Relieving Factors massage, heat            [facility] PT Assessment - 01/20/17 0001      Assessment   Medical Diagnosis neck pain   Referring Provider Ellouise Newer, MD   Onset Date/Surgical Date 12/20/16   Hand Dominance Right   Next MD Visit none   Prior Therapy none recent     Precautions   Precautions Other (comment)  prostate and bladder cancer     Restrictions   Weight Bearing Restrictions No     Balance  Screen   Has the patient fallen in the past 6 months No  vertigo-loss of balance   Has the patient had a decrease in activity level because of a fear of falling?  No   Is the patient reluctant to leave their home because of a fear of falling?  No     Home Ecologist residence     Prior Function   Level of Independence Independent   Vocation Retired   Leisure Chief Operating Officer, watch TV, listen to books, volunteer at [employer]   Overall Cognitive Status Within Functional Limits for tasks assessed     Observation/Other Assessments   Focus on Therapeutic Outcomes (FOTO)  36% limitation     Posture/Postural Control   Posture/Postural Control Postural limitations   Postural Limitations Rounded Shoulders;Forward head     ROM / Strength   AROM / PROM / Strength AROM;PROM;Strength     AROM   Overall AROM   Deficits   Overall AROM Comments UE A/ROM is full   AROM Assessment Site Cervical   Cervical Flexion 40   Cervical Extension 35   Cervical - Right Side Bend 25   Cervical - Left Side Bend 40   Cervical - Right Rotation 60   Cervical - Left Rotation 45     PROM   Overall PROM  Deficits   Overall PROM Comments cervical A/ROM limited by 25% into rotation and sidebending     Strength   Overall Strength Within functional limits for tasks performed     Palpation   Spinal mobility reduced PA mobility in the cervical spine with pain C3-7   Palpation comment tension and trigger points Rt>Lt suboccipitals and cervical paraspinals.       Transfers   Transfers Independent with all Transfers     Ambulation/Gait   Ambulation/Gait Yes   Ambulation/Gait Assistance 7: Independent            Objective measurements completed on examination: See above findings.                  PT Education - 01/20/17 1054    Education provided Yes   Education Details posture, cervical A/ROM   Person(s) Educated Patient   Methods Explanation;Demonstration;Handout   Comprehension Verbalized understanding;Returned demonstration          PT Short Term Goals - 01/20/17 1058      PT SHORT TERM GOAL #1   Title be independent in initial HEP   Time 4   Period Weeks   Status New   Target Date 02/17/17     PT SHORT TERM GOAL #2   Title demonstrate 60 degrees of cervical A/ROM bil to improve safety with driving   Time 4   Status New   Target Date 02/17/17     PT SHORT TERM GOAL #3   Title report < or = to 5/10 max neck pain with ADLs and self-care   Time 4   Period Weeks   Status New   Target Date 02/17/17           PT Long Term Goals - 01/20/17 1105      PT LONG TERM GOAL #1   Title be independent in advanced HEP   Time 8   Period Weeks   Target Date 03/17/17     PT LONG TERM GOAL #2   Title demonstrate 70 degrees bil cervical A/ROM rotation to improve safety with  driving   Time 8   Target Date 03/17/17     PT LONG TERM GOAL #3   Title report < or = to 3/10 max neck pain with ADLs and self-care   Time 8   Period Weeks   Target Date 03/17/17     PT LONG TERM GOAL #4   Title ^^                Plan - February 05, 2017 1106    Clinical Impression Statement Pt presents to PT with 1 month history of neck pain without cause.  Pt had cervical surgery in 2007.  Pt demonstrates limited cervical A/ROM, reduced spinal segmental mobility and tension/trigger points in Rt>Lt cervical musculature.  Pt with forward head and rounded shoulder posture.  Pt will benefit from skilled PT for postural strength, education, cervical A/ROM, trigger point release/dry needling and spinal joint mobiziations to reduce pain.     History and Personal Factors relevant to plan of care: prior neck surgery   Clinical Presentation Stable   Clinical Decision Making Low   Rehab Potential Good   PT Frequency 2x / week   PT Duration 8 weeks   PT Treatment/Interventions ADLs/Self Care Home Management;Cryotherapy;Electrical Stimulation;Functional mobility training;Traction;Moist Heat;Therapeutic activities;Therapeutic exercise;Patient/family education;Passive range of motion;Manual techniques;Dry needling;Vasopneumatic Device;Taping   PT Next Visit Plan postural strength, joint mobs, manual to neck/dry needling, cervical A/ROM   Consulted and Agree with Plan of Care Patient      Patient will benefit from skilled therapeutic intervention in order to improve the following deficits and impairments:  Postural dysfunction, Impaired flexibility, Pain, Increased muscle spasms, Decreased range of motion  Visit Diagnosis: Cervicalgia  Abnormal posture  Other muscle spasm      G-Codes - 02-05-2017 1036    Functional Assessment Tool Used (Outpatient Only) FOTO: 36% limitation   Functional Limitation Other PT primary   Other PT Primary Current Status (Y5035) At least 20 percent but less than  40 percent impaired, limited or restricted   Other PT Primary Goal Status (W6568) At least 20 percent but less than 40 percent impaired, limited or restricted       Problem List Patient Active Problem List   Diagnosis Date Noted  . Venous insufficiency 12/29/2016  . Ganglion cyst of tendon sheath of right hand 12/29/2016  . Avulsion fracture of metatarsal bone of right foot 11/25/2016  . Gout 11/20/2016  . Dyslipidemia 09/23/2016  . Obesity, Class I, BMI 30-34.9 10/03/2015  . Hereditary and idiopathic peripheral neuropathy 09/25/2014  . Allergic rhinitis 05/31/2014  . Fatigue 11/09/2013  . CKD (chronic kidney disease), stage III ([place]) 10/23/2013  . Dizziness 08/23/2013  . Neuropathy ([place]) 08/21/2013  . RLS (restless legs syndrome) 01/03/2013  . Low back pain radiating to left leg 10/09/2011  . History of prostate cancer. Gleason 6 in 2012 04/01/2010  . OSTEOARTHRITIS, LOWER LEG, LEFT 10/04/2009  . TOBACCO ABUSE, HX OF 12/26/2008  . ANEMIA, IRON DEFICIENCY 11/06/2008  . 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 Essential hypertension 02/21/2007  . OSA (obstructive sleep apnea) 11/02/2006  . Hyperlipidemia 10/12/2006  . DEPRESSION 10/12/2006  . Coronary atherosclerosis 10/12/2006  . Asthma 10/12/2006  . GERD 10/12/2006  . Irritable bowel syndrome 10/12/2006     Sigurd Sos, PT Feb 05, 2017 11:14 AM    Name: Joshua [name] MRN: [id number] Date of Birth: 22-Jul-1940

## 2017-01-21 ENCOUNTER — Encounter: Payer: Self-pay | Admitting: Neurology

## 2017-01-25 ENCOUNTER — Ambulatory Visit (INDEPENDENT_AMBULATORY_CARE_PROVIDER_SITE_OTHER): Payer: Medicare Other

## 2017-01-25 DIAGNOSIS — M542 Cervicalgia: Secondary | ICD-10-CM | POA: Diagnosis not present

## 2017-01-25 DIAGNOSIS — G609 Hereditary and idiopathic neuropathy, unspecified: Secondary | ICD-10-CM | POA: Diagnosis not present

## 2017-01-25 DIAGNOSIS — M47812 Spondylosis without myelopathy or radiculopathy, cervical region: Secondary | ICD-10-CM | POA: Diagnosis not present

## 2017-01-26 ENCOUNTER — Encounter: Payer: Self-pay | Admitting: Family Medicine

## 2017-01-26 ENCOUNTER — Ambulatory Visit: Payer: Medicare Other | Admitting: Sports Medicine

## 2017-01-26 ENCOUNTER — Ambulatory Visit (INDEPENDENT_AMBULATORY_CARE_PROVIDER_SITE_OTHER): Payer: Medicare Other | Admitting: Family Medicine

## 2017-01-26 VITALS — BP 126/76 | HR 79 | Temp 97.6°F | Ht 67.0 in | Wt 213.4 lb

## 2017-01-26 DIAGNOSIS — I2510 Atherosclerotic heart disease of native coronary artery without angina pectoris: Secondary | ICD-10-CM

## 2017-01-26 DIAGNOSIS — H6123 Impacted cerumen, bilateral: Secondary | ICD-10-CM

## 2017-01-26 NOTE — Progress Notes (Signed)
Subjective:  Joshua [name] is a 76 y.o. year old very pleasant male patient who presents for/with See problem oriented charting ROS- some decreased hearing  Before ear irrigation. Feeling of fullness in ear.   Past Medical History-  CAD, IBS, CKD III  Medications- reviewed and updated Current Outpatient Medications  Medication Sig Dispense Refill  . acetaminophen (TYLENOL) 500 MG tablet Take 1,000 mg by mouth every 6 (six) hours as needed for headache (pain). Reported on 07/02/2015    . albuterol (PROAIR HFA) 108 (90 BASE) MCG/ACT inhaler Inhale 1-2 puffs into the lungs every 6 (six) hours as needed for wheezing or shortness of breath. 18 g 3  . ammonium lactate (LAC-HYDRIN FIVE) 5 % LOTN lotion Apply 1 application topically 2 (two) times daily. 340 mL 2  . aspirin EC 81 MG tablet Take 81 mg by mouth daily.    . Cyanocobalamin (VITAMIN B 12 PO) Take 800 mg by mouth daily.    [name] desloratadine (CLARINEX) 5 MG tablet Take 5 mg by mouth at bedtime.     . diazepam (VALIUM) 5 MG tablet Take 1 tablet (5 mg total) by mouth at bedtime as needed. 30 tablet 2  . Evolocumab (REPATHA SURECLICK) 485 MG/ML SOAJ Inject 140 mg into the skin every 14 (fourteen) days. 2 pen 11  . FLOVENT HFA 110 MCG/ACT inhaler Inhale 2 puffs into the lungs 2 (two) times daily. Reported on 03/22/2015  5  . fluticasone (FLONASE) 50 MCG/ACT nasal spray Place 1 spray into both nostrils 2 (two) times daily.     . folic acid (FOLVITE) 462 MCG tablet Take 400 mcg by mouth daily.    [name] gabapentin (NEURONTIN) 100 MG capsule Take 1 cap in AM, 1 cap at noon, and 4 caps in PM 180 capsule 11  . hydrocortisone 2.5 % cream     . irbesartan (AVAPRO) 300 MG tablet Take 1 tablet (300 mg total) by mouth daily. 90 tablet 1  . meclizine (ANTIVERT) 25 MG tablet Take 1 tablet (25 mg total) by mouth 3 (three) times daily as needed for dizziness. 30 tablet 0  . nitroGLYCERIN (NITRODUR - DOSED IN MG/24 HR) 0.2 mg/hr patch Place 1/4 to 1/2 of a patch  over affected region. Remove and replace once daily.  Slightly alter skin placement daily 30 patch 1  . polyethylene glycol powder (GLYCOLAX/MIRALAX) powder Take 17 g by mouth daily. 255 g 0  . Probiotic Product (ALIGN) 4 MG CAPS Take 4 mg by mouth daily.     . simvastatin (ZOCOR) 40 MG tablet Take 1 tablet (40 mg total) by mouth at bedtime. (Patient taking differently: Take 20 mg by mouth at bedtime. ) 90 tablet 3  . diclofenac sodium (VOLTAREN) 1 % GEL Apply topically to affected area qid (Patient not taking: Reported on 01/26/2017) 100 g 1   No current facility-administered medications for this visit.     Objective: BP 126/76 (BP Location: Left Arm, Patient Position: Sitting, Cuff Size: Large)   Pulse 79   Temp 97.6 F (36.4 C) (Oral)   Ht 5\' 7"  (1.702 m)   Wt 213 lb 6.4 oz (96.8 kg)   SpO2 96%   BMI 33.42 kg/m  Gen: NAD, resting comfortably TM normal after irrigation. Before irrigation occlusion due to cerumen.   Assessment/Plan:  Hearing loss of both ears due to cerumen impaction S: Decreased hearing and clogged feeling for several days. Audiologist recommended we clean it out.  A/P: complete restoratoin of hearing after  irrigation today. Normal TM.   He wants to switch PT to [facility]- we assisted him in setting this up for neck pain today  Future Appointments  Date Time Provider [place]  01/27/2017  1:20 PM Gerda Diss, DO [facility] None  01/27/2017  2:30 PM HORSE PEN SUB THERAPIST A [facility] None  04/05/2017  9:00 AM [name], MD [facility]  04/06/2017 10:00 AM [name], MD [facility] None  07/20/2017  8:30 AM Cameron Sprang, MD [facility] None   Return precautions advised. Prn for acute issue [name], MD

## 2017-01-26 NOTE — Patient Instructions (Signed)
Ears look great  Stop by checkout to have them schedule next PT visit here. Try again to see if they can cancel [facility] visits for you- tell them I said pretty please

## 2017-01-27 ENCOUNTER — Encounter: Payer: Self-pay | Admitting: Sports Medicine

## 2017-01-27 ENCOUNTER — Ambulatory Visit (INDEPENDENT_AMBULATORY_CARE_PROVIDER_SITE_OTHER): Payer: Medicare Other | Admitting: Sports Medicine

## 2017-01-27 ENCOUNTER — Ambulatory Visit: Payer: Medicare Other

## 2017-01-27 ENCOUNTER — Other Ambulatory Visit: Payer: Self-pay

## 2017-01-27 ENCOUNTER — Other Ambulatory Visit: Payer: Self-pay | Admitting: Sports Medicine

## 2017-01-27 ENCOUNTER — Ambulatory Visit (INDEPENDENT_AMBULATORY_CARE_PROVIDER_SITE_OTHER): Payer: Medicare Other | Admitting: Physical Therapy

## 2017-01-27 ENCOUNTER — Encounter: Payer: Self-pay | Admitting: Physical Therapy

## 2017-01-27 VITALS — BP 122/84 | HR 81 | Ht 67.0 in | Wt 214.4 lb

## 2017-01-27 DIAGNOSIS — R293 Abnormal posture: Secondary | ICD-10-CM | POA: Diagnosis not present

## 2017-01-27 DIAGNOSIS — N183 Chronic kidney disease, stage 3 unspecified: Secondary | ICD-10-CM

## 2017-01-27 DIAGNOSIS — M542 Cervicalgia: Secondary | ICD-10-CM | POA: Diagnosis not present

## 2017-01-27 DIAGNOSIS — M62838 Other muscle spasm: Secondary | ICD-10-CM

## 2017-01-27 DIAGNOSIS — I872 Venous insufficiency (chronic) (peripheral): Secondary | ICD-10-CM

## 2017-01-27 DIAGNOSIS — G629 Polyneuropathy, unspecified: Secondary | ICD-10-CM

## 2017-01-27 DIAGNOSIS — I2510 Atherosclerotic heart disease of native coronary artery without angina pectoris: Secondary | ICD-10-CM

## 2017-01-27 DIAGNOSIS — S92301D Fracture of unspecified metatarsal bone(s), right foot, subsequent encounter for fracture with routine healing: Secondary | ICD-10-CM

## 2017-01-27 DIAGNOSIS — M1711 Unilateral primary osteoarthritis, right knee: Secondary | ICD-10-CM

## 2017-01-27 DIAGNOSIS — M67441 Ganglion, right hand: Secondary | ICD-10-CM | POA: Diagnosis not present

## 2017-01-27 MED ORDER — DICLOFENAC SODIUM 1 % TD GEL: TRANSDERMAL | 1 refills | Status: DC

## 2017-01-27 NOTE — Patient Instructions (Signed)
See separate info on neck traction pillow

## 2017-01-27 NOTE — Therapy (Signed)
[place] [address] [place], [place], [address] Phone: [phone]   Fax:  [phone]  Physical Therapy Treatment  Patient Details  Name: Joshua [name] MRN: [id number] Date of Birth: November 30, 1940 Referring Provider: Ellouise Newer, MD   Encounter Date: 01/27/2017  PT End of Session - 01/27/17 1509    Visit Number  2    Number of Visits  10    Date for PT Re-Evaluation  03/17/17    PT Start Time  8546    PT Stop Time  1500    PT Time Calculation (min)  45 min    Activity Tolerance  Patient tolerated treatment well    Behavior During Therapy  [facility] for tasks assessed/performed       Past Medical History:  Diagnosis Date  . Acute medial meniscal tear   . Anemia   . Arthritis    "middle finger right hand; right knee; neck" (02/06/2014)  . Asthma   . Bladder cancer ([place]) 04/2010   "cauterized during prostate OR"  . CAD (coronary artery disease)    CABG 2001  . Depression   . Diverticulosis   . DJD (degenerative joint disease)   . GERD (gastroesophageal reflux disease)   . History of blood transfusion 2001   "related to the bypass OR"  . History of gout   . Hyperlipidemia   . Hypertension   . IBS (irritable bowel syndrome)   . Obesity   . OSA (obstructive sleep apnea)   . Pancreatitis ~ 1980  . Pneumonia 1980's X 2  . Prostate cancer ([place]) 04/2010    Past Surgical History:  Procedure Laterality Date  . ANTERIOR CERVICAL DECOMP/DISCECTOMY FUSION  05/26/06  . BACK SURGERY    . CARDIAC CATHETERIZATION  11/20/1999  . CORONARY ARTERY BYPASS GRAFT  11/20/1999   SVG-RI1-RI2, SVG-OM, SVG-dRCA  . KNEE ARTHROSCOPY Right 1992; 04/2008  . LAPAROSCOPIC CHOLECYSTECTOMY  2007  . NASAL SEPTUM SURGERY Right 2007  . ROBOT ASSISTED LAPAROSCOPIC RADICAL PROSTATECTOMY  04/2010  . THYROIDECTOMY, PARTIAL  1980    There were no vitals filed for this visit.  Subjective Assessment - 01/27/17 1423    Subjective  exercises caused some  pain    Pertinent History  neck surgery: 2007, Rt foot fracture 6 weeks ago    Patient Stated Goals  reduce neck pain    Currently in Pain?  Yes    Pain Score  4     Pain Location  Neck    Pain Orientation  Right    Pain Descriptors / Indicators  Squeezing    Pain Type  Acute pain    Pain Onset  More than a month ago    Pain Frequency  Constant    Aggravating Factors   lying in bed    Pain Relieving Factors  massage, heat                      [facility] Adult PT Treatment/Exercise - 01/27/17 1428      Self-Care   Self-Care  Other Self-Care Comments    Other Self-Care Comments   educated on DN including lung field precautions      Exercises   Exercises  Neck      Neck Exercises: Machines for Strengthening   Other Machines for Strengthening  recumbent bike L1 x 10 min; 1st 4 min unsupervised      Neck Exercises: Theraband   Shoulder External Rotation  10 reps;Red  Horizontal ABduction  10 reps;Red      Neck Exercises: Seated   Cervical Rotation  Both 2 reps; 20 sec bil   2 reps; 20 sec bil   Lateral Flexion  Both 2 reps; 20 sec hold   2 reps; 20 sec hold   Shoulder Rolls  Backwards;15 reps    Other Seated Exercise  forward flexion stretch 2 reps x 30 sec    Other Seated Exercise  scapular retraction 5 sec x 10 reps      Neck Exercises: Supine   Neck Retraction  10 reps;5 secs      Manual Therapy   Manual Therapy  Soft tissue mobilization;Myofascial release    Manual therapy comments  pt supine    Soft tissue mobilization  bil suboccipitals; Rt upper trap and levator    Myofascial Release  suboccipital release; and TPR to Rt upper trap and levator scapula             PT Education - 01/27/17 1509    Education provided  Yes    Education Details  DN, modified HEP and educated on importance of gentle stretch v/s overstretching    Person(s) Educated  Patient    Methods  Explanation;Demonstration    Comprehension  Verbalized understanding       PT  Short Term Goals - 01/20/17 1058      PT SHORT TERM GOAL #1   Title  be independent in initial HEP    Time  4    Period  Weeks    Status  New    Target Date  02/17/17      PT SHORT TERM GOAL #2   Title  demonstrate 60 degrees of cervical A/ROM bil to improve safety with driving    Time  4    Status  New    Target Date  02/17/17      PT SHORT TERM GOAL #3   Title  report < or = to 5/10 max neck pain with ADLs and self-care    Time  4    Period  Weeks    Status  New    Target Date  02/17/17        PT Long Term Goals - 01/20/17 1105      PT LONG TERM GOAL #1   Title  be independent in advanced HEP    Time  8    Period  Weeks    Target Date  03/17/17      PT LONG TERM GOAL #2   Title  demonstrate 70 degrees bil cervical A/ROM rotation to improve safety with driving    Time  8    Target Date  03/17/17      PT LONG TERM GOAL #3   Title  report < or = to 3/10 max neck pain with ADLs and self-care    Time  8    Period  Weeks    Target Date  03/17/17      PT LONG TERM GOAL #4   Title  ^^            Plan - 01/27/17 1510    Clinical Impression Statement  Pt tolerated session well today with improved pain following session.  Reviewed HEP and reinforced gentle stretches as pt seemed to be overstretching into pain with HEP.  Decreased reps to 2 reps as well to see if pt tolerates better.  If pt continues to have increased pain with HEP will  modify next session.  Provided information on DN today and pt open to DN next session.    PT Treatment/Interventions  ADLs/Self Care Home Management;Cryotherapy;Electrical Stimulation;Functional mobility training;Traction;Moist Heat;Therapeutic activities;Therapeutic exercise;Patient/family education;Passive range of motion;Manual techniques;Dry needling;Vasopneumatic Device;Taping    PT Next Visit Plan  postural strength, manual to neck/dry needling, cervical A/ROM    Consulted and Agree with Plan of Care  Patient       Patient  will benefit from skilled therapeutic intervention in order to improve the following deficits and impairments:  Postural dysfunction, Impaired flexibility, Pain, Increased muscle spasms, Decreased range of motion  Visit Diagnosis: Cervicalgia  Abnormal posture  Other muscle spasm     Problem List Patient Active Problem List   Diagnosis Date Noted  . Venous insufficiency 12/29/2016  . Ganglion cyst of tendon sheath of right hand 12/29/2016  . Avulsion fracture of metatarsal bone of right foot 11/25/2016  . Gout 11/20/2016  . Dyslipidemia 09/23/2016  . Obesity, Class I, BMI 30-34.9 10/03/2015  . Hereditary and idiopathic peripheral neuropathy 09/25/2014  . Allergic rhinitis 05/31/2014  . Fatigue 11/09/2013  . CKD (chronic kidney disease), stage III ([place]) 10/23/2013  . Dizziness 08/23/2013  . Neuropathy ([place]) 08/21/2013  . RLS (restless legs syndrome) 01/03/2013  . Low back pain radiating to left leg 10/09/2011  . History of prostate cancer. Gleason 6 in 2012 04/01/2010  . OSTEOARTHRITIS, LOWER LEG, LEFT 10/04/2009  . TOBACCO ABUSE, HX OF 12/26/2008  . ANEMIA, IRON DEFICIENCY 11/06/2008  . 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 Essential hypertension 02/21/2007  . OSA (obstructive sleep apnea) 11/02/2006  . Hyperlipidemia 10/12/2006  . DEPRESSION 10/12/2006  . Coronary atherosclerosis 10/12/2006  . Asthma 10/12/2006  . GERD 10/12/2006  . Irritable bowel syndrome 10/12/2006      Laureen Abrahams, PT, DPT 01/27/17 3:16 PM    [place], [place], [address] Phone: [phone]   Fax:  [phone]  Name: Joshua [name] MRN: [id number] Date of Birth: March 16, 1941

## 2017-01-27 NOTE — Patient Instructions (Signed)

## 2017-01-27 NOTE — Progress Notes (Signed)
Pt has LE arthritis and foot pain that would benefit from NSAID use however he is not a candidate for NSAID systemic therapy due to his underlying comorbidities.  He would benefit from topical Voltaren and a prescription was resubmitted today.Joshua [name]

## 2017-01-27 NOTE — Progress Notes (Signed)
2 OFFICE VISIT NOTE Joshua [name], [facility]  Joshua [name] - 76 y.o. male MRN [id number]  Date of birth: 12/14/1940  Visit Date: 01/27/2017  PCP: Joshua Olp, MD   Referred by: Joshua Olp, MD  Joshua [name], CMA acting as scribe for Joshua [name].  SUBJECTIVE:   Chief Complaint  Patient presents with  . Follow-up    RT foot pain   HPI: As below and per problem based documentation when appropriate.  Joshua [name] is an established patient presenting today in follow-up of RT foot pain. He was last seen 12/29/16 and was provided with prescription for VIVE compression socks. It was recommended that he continue following Nitro Protocol and using compression  Pt stopped using Nitroglycerin patches about 1 week ago. He noticed that his foot stopped burning after he stopped using the patches. He says that his foot is not hurting anymore. He did purchase the VIVE wear compression socks but they caused his feet to hurt more. He is not very happy about this because they were over $70.00 and they were not re-imbursable through Medicare.   Pt will have his first PT visit with Joshua [name] today for neck pain, he [facility]. He was referred to PT by his neurologist and by Joshua [name]. The pain that he is having now feels about the same as prior to his neck surgery. Pain started back a couple of weeks ago. The pain has causes him to have occasional HA. Pain improved after switching to a flatter pillow. No radiation of pain into arms. He has xray of his neck done with his neurologist.     Review of Systems  Constitutional: Positive for malaise/fatigue. Negative for chills and fever.  Respiratory: Negative for shortness of breath and wheezing.   Cardiovascular: Negative for chest pain, palpitations and leg swelling.  Neurological: Positive for tingling and headaches. Negative for dizziness  and weakness.    Otherwise per HPI.  HISTORY & PERTINENT PRIOR DATA:  No specialty comments available. He reports that he quit smoking about 24 years ago. His smoking use included cigarettes. He has a 35.00 pack-year smoking history. he has never used smokeless tobacco. No results for input(s): HGBA1C, LABURIC, CREATINE in the last 8760 hours.  Invalid input(s): CR Allergies reviewed per EMR Prior to Admission medications   Medication Sig Start Date End Date Taking? Authorizing Provider  acetaminophen (TYLENOL) 500 MG tablet Take 1,000 mg by mouth every 6 (six) hours as needed for headache (pain). Reported on 07/02/2015   Yes [provider]  albuterol (PROAIR HFA) 108 (90 BASE) MCG/ACT inhaler Inhale 1-2 puffs into the lungs every 6 (six) hours as needed for wheezing or shortness of breath. 10/19/14  Yes Joshua Olp, MD  aspirin EC 81 MG tablet Take 81 mg by mouth daily.   Yes [provider]  Cyanocobalamin (VITAMIN B 12 PO) Take 800 mg by mouth daily.   Yes [provider]  desloratadine (CLARINEX) 5 MG tablet Take 5 mg by mouth at bedtime.    Yes [provider]  diazepam (VALIUM) 5 MG tablet Take 1 tablet (5 mg total) by mouth at bedtime as needed. 01/01/17  Yes Joshua Olp, MD  Evolocumab (REPATHA SURECLICK) 527 MG/ML SOAJ Inject 140 mg into the skin every 14 (fourteen) days. 09/25/16  Yes Joshua Breeding, MD  FLOVENT HFA 110 MCG/ACT inhaler  Inhale 2 puffs into the lungs 2 (two) times daily. Reported on 03/22/2015 07/17/14  Yes [provider]  fluticasone (FLONASE) 50 MCG/ACT nasal spray Place 1 spray into both nostrils 2 (two) times daily.  09/05/12  Yes Joshua Dillon, MD  folic acid (FOLVITE) 284 MCG tablet Take 400 mcg by mouth daily.   Yes [provider]  gabapentin (NEURONTIN) 100 MG capsule Take 1 cap in AM, 1 cap at noon, and 4 caps in PM 01/18/17  Yes Joshua Sprang, MD  hydrocortisone 2.5 % cream  12/21/16  Yes  [provider]  irbesartan (AVAPRO) 300 MG tablet Take 1 tablet (300 mg total) by mouth daily. 10/15/16  Yes Joshua Olp, MD  polyethylene glycol powder (GLYCOLAX/MIRALAX) powder Take 17 g by mouth daily. 08/29/14  Yes Joshua Furry, MD  Probiotic Product (ALIGN) 4 MG CAPS Take 4 mg by mouth daily.    Yes [provider]  simvastatin (ZOCOR) 40 MG tablet Take 1 tablet (40 mg total) by mouth at bedtime. Patient taking differently: Take 20 mg by mouth at bedtime.  06/18/16  Yes Joshua Olp, MD  diclofenac sodium (VOLTAREN) 1 % GEL Apply topically to affected area qid Patient not taking: Reported on 01/27/2017 01/27/17   Joshua Diss, DO   Patient Active Problem List   Diagnosis Date Noted  . Neck pain 02/08/2017  . Venous insufficiency 12/29/2016  . Ganglion cyst of tendon sheath of right hand 12/29/2016  . Avulsion fracture of metatarsal bone of right foot 11/25/2016  . Gout 11/20/2016  . Dyslipidemia 09/23/2016  . Obesity, Class I, BMI 30-34.9 10/03/2015  . Hereditary and idiopathic peripheral neuropathy 09/25/2014  . Allergic rhinitis 05/31/2014  . Fatigue 11/09/2013  . CKD (chronic kidney disease), stage III ([place]) 10/23/2013  . Dizziness 08/23/2013  . Neuropathy ([place]) 08/21/2013  . RLS (restless legs syndrome) 01/03/2013  . Low back pain radiating to left leg 10/09/2011  . History of prostate cancer. Gleason 6 in 2012 04/01/2010  . OSTEOARTHRITIS, LOWER LEG, LEFT 10/04/2009  . TOBACCO ABUSE, HX OF 12/26/2008  . ANEMIA, IRON DEFICIENCY 11/06/2008  . 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 Essential hypertension 02/21/2007  . OSA (obstructive sleep apnea) 11/02/2006  . Hyperlipidemia 10/12/2006  . DEPRESSION 10/12/2006  . Coronary atherosclerosis 10/12/2006  . Asthma 10/12/2006  . GERD 10/12/2006  . Irritable bowel syndrome 10/12/2006   Past Medical History:  Diagnosis Date  . Acute medial meniscal tear   . Anemia   . Arthritis    "middle  finger right hand; right knee; neck" (02/06/2014)  . Asthma   . 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 CAD (coronary artery disease)    CABG 2001  . Depression   . Diverticulosis   . DJD (degenerative joint disease)   . GERD (gastroesophageal reflux disease)   . History of blood transfusion 2001   "related to the bypass OR"  . History of gout   . Hyperlipidemia   . Hypertension   . IBS (irritable bowel syndrome)   . Obesity   . OSA (obstructive sleep apnea)   . Pancreatitis ~ 1980  . Pneumonia 1980's X 2  . Prostate cancer ([place]) 04/2010   Family History  Problem Relation Age of Onset  . Heart disease Mother   . Hyperlipidemia Mother   . Heart disease Father   . Hyperlipidemia Father   . Diabetes Brother    Past Surgical History:  Procedure Laterality Date  . ANTERIOR CERVICAL DECOMP/DISCECTOMY FUSION  05/26/06  . BACK SURGERY    . CARDIAC CATHETERIZATION  11/20/1999  . CORONARY ARTERY BYPASS GRAFT  11/20/1999   SVG-RI1-RI2, SVG-OM, SVG-dRCA  . KNEE ARTHROSCOPY Right 1992; 04/2008  . LAPAROSCOPIC CHOLECYSTECTOMY  2007  . NASAL SEPTUM SURGERY Right 2007  . ROBOT ASSISTED LAPAROSCOPIC RADICAL PROSTATECTOMY  04/2010  . THYROIDECTOMY, PARTIAL  1980   Social History   Occupational History  . Occupation: retired    Fish farm manager: RETIRED  Tobacco Use  . Smoking status: Former Smoker    Packs/day: 1.00    Years: 35.00    Pack years: 35.00    Types: Cigarettes    Last attempt to quit: 06/16/1992    Years since quitting: 24.6  . Smokeless tobacco: Never Used  . Tobacco comment: discussed the AAA but thinks he may have had this   Substance and Sexual Activity  . Alcohol use: Yes    Alcohol/week: 2.4 oz    Types: 4 Shots of liquor per week    Comment: mixed drink  . Drug use: No  . Sexual activity: No    OBJECTIVE:  VS:  HT:5\' 7"  (170.2 cm)   WT:214 lb 6.4 oz (97.3 kg)  BMI:33.57    BP:122/84  HR:81bpm  TEMP: ( )  RESP:96 % EXAM: Findings:    Bilateral lower extremities overall well aligned.  No significant deformity.  He has 2+ pitting edema bilaterally and has generalized tenderness over his lower extremities.  Sensation is intact light touch but is altered.  Bilateral upper extremities overall well aligned.  Bitemporal strength testing is intact.  He has a small amount of pain that localizes to the posterior neck with Spurling's compression test bilaterally.  Upper extremity reflexes are intact.    RADIOLOGY: DG Cervical Spine 2 or 3 views CLINICAL DATA:  Right-sided neck pain for 1 month without known injury.  EXAM: CERVICAL SPINE - 2-3 VIEW  COMPARISON:  Radiographs of November 15, 2012.  FINDINGS: No fracture or spondylolisthesis is noted. Status post surgical anterior fusion of C5, C6 and C7. No prevertebral soft tissue swelling is noted. Remaining disc spaces are unremarkable. Degenerative changes seen involving posterior facet joints bilaterally.  IMPRESSION: Postsurgical changes as described above. No acute abnormality seen in the cervical spine.  Electronically Signed   By: Marijo Conception, M.D.   On: 01/25/2017 12:56  ASSESSMENT & PLAN:     ICD-10-CM   1. CKD (chronic kidney disease), stage III ([facility]) N18.3   2. Ganglion cyst of tendon sheath of right hand M67.441   3. Venous insufficiency I87.2   4. Closed avulsion fracture of metatarsal bone of right foot with routine healing, subsequent encounter S92.301D   5. Neuropathy (H[facility]) G62.9   6. Neck pain M54.2    ================================================================= Venous insufficiency Patient has still been intolerant of compression socks due to underlying neuropathy.  Recommend trying a lower level compression.  Did recommend he follow-up with the medical supply company and discuss finally with insurance given this was a prescription and is for appropriate level of compression for this to be considered a medical grade device  Ganglion  cyst of tendon sheath of right hand He has a very small ganglion.  Essentially asymptomatic.  Incidental finding  Avulsion fracture of metatarsal bone of right foot Foot continues to do quite well from the base of the fifth metatarsal standpoint but he continues to have neuropathic pain.  Neck pain Patient incidentally  reports having generalized neck pain.  Recommend looking into home traction device and if any lack of improvement will consider further diagnostic evaluation.  He has been referred for physical therapy and I will defer to their expertise at this time.  No notes on file ================================================================= Patient Instructions  See separate info on neck traction pillow  =================================================================   Follow-up: Return if symptoms worsen or fail to improve.   CMA/ATC served as Education administrator during this visit. History, Physical, and Plan performed by medical provider. Documentation and orders reviewed and attested to.      Teresa Coombs, [facility] Physician

## 2017-01-28 ENCOUNTER — Encounter: Payer: Self-pay | Admitting: Sports Medicine

## 2017-01-28 ENCOUNTER — Encounter: Payer: Medicare Other | Admitting: Physical Therapy

## 2017-01-29 ENCOUNTER — Telehealth: Payer: Self-pay

## 2017-01-29 NOTE — Telephone Encounter (Signed)
Message sent via MyChart.

## 2017-01-29 NOTE — Telephone Encounter (Signed)
-----   Message from Cameron Sprang, MD sent at 01/25/2017  2:50 PM EST ----- Pls let him know the xray did not show any fracture, it showed arthritis changes, the post-surgical changes are not worsened. Thanks

## 2017-01-31 ENCOUNTER — Encounter: Payer: Self-pay | Admitting: Family Medicine

## 2017-02-01 DIAGNOSIS — M25562 Pain in left knee: Secondary | ICD-10-CM | POA: Diagnosis not present

## 2017-02-02 ENCOUNTER — Ambulatory Visit (INDEPENDENT_AMBULATORY_CARE_PROVIDER_SITE_OTHER): Payer: Medicare Other | Admitting: Physical Therapy

## 2017-02-02 ENCOUNTER — Encounter: Payer: Self-pay | Admitting: Physical Therapy

## 2017-02-02 DIAGNOSIS — R293 Abnormal posture: Secondary | ICD-10-CM | POA: Diagnosis not present

## 2017-02-02 DIAGNOSIS — J3089 Other allergic rhinitis: Secondary | ICD-10-CM | POA: Diagnosis not present

## 2017-02-02 DIAGNOSIS — M542 Cervicalgia: Secondary | ICD-10-CM

## 2017-02-02 DIAGNOSIS — M62838 Other muscle spasm: Secondary | ICD-10-CM | POA: Diagnosis not present

## 2017-02-02 DIAGNOSIS — J3081 Allergic rhinitis due to animal (cat) (dog) hair and dander: Secondary | ICD-10-CM | POA: Diagnosis not present

## 2017-02-02 DIAGNOSIS — J301 Allergic rhinitis due to pollen: Secondary | ICD-10-CM | POA: Diagnosis not present

## 2017-02-02 NOTE — Patient Instructions (Signed)
Supine Push    Take a deep breath and exhale while pushing the back of neck down on the bed. Hold __5__ seconds. Repeat __10__ times. Do __1-2__ sessions per day.   Pectoralis Stretch With Scapula Adduction: Supine (Towel Roll)    Lie with rolled towel under spine vertically. Gently squeeze shoulder blades together.  Hold for 5 seconds. Do _10__ times, _1-2__ times per day.   Trunk: Rotation    Lie on back on firm, flat surface with knees bent. Keep head and shoulders flat, slowly lower knees to floor. May also do with legs straight. Lift one at a time up and across to touch floor. Hold _10___ seconds. Repeat __5__ times, alternating sides. Do _1-2___ sessions per day. CAUTION: Movement should be gentle, steady and slow.         Healthy Back - Shoulder Roll    Stand straight with arms relaxed at sides. Roll shoulders backward continuously. Do __10-15__ times.   Perform several sessions throughout the day.  Scapular Retraction (Standing)    With arms at sides, pinch shoulder blades together.  Hold for 5 seconds. Repeat __10__ times per set. Do __1__ sets per session. Do __1-2__ sessions per day.

## 2017-02-02 NOTE — Therapy (Signed)
[facility] [address] [place], [place], [address] Phone: [phone]   Fax:  [phone]  Physical Therapy Treatment  Patient Details  Name: Joshua [name] MRN: [id number] Date of Birth: 02-09-1941 Referring Provider: Ellouise Newer, MD   Encounter Date: 02/02/2017  PT End of Session - 02/02/17 1059    Visit Number  3    Number of Visits  10    Date for PT Re-Evaluation  03/17/17    PT Start Time  1020    PT Stop Time  1059    PT Time Calculation (min)  39 min    Activity Tolerance  Patient tolerated treatment well    Behavior During Therapy  [facility] for tasks assessed/performed       Past Medical History:  Diagnosis Date  . Acute medial meniscal tear   . Anemia   . Arthritis    "middle finger right hand; right knee; neck" (02/06/2014)  . Asthma   . 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 CAD (coronary artery disease)    CABG 2001  . Depression   . Diverticulosis   . DJD (degenerative joint disease)   . GERD (gastroesophageal reflux disease)   . History of blood transfusion 2001   "related to the bypass OR"  . History of gout   . Hyperlipidemia   . Hypertension   . IBS (irritable bowel syndrome)   . Obesity   . OSA (obstructive sleep apnea)   . Pancreatitis ~ 1980  . Pneumonia 1980's X 2  . Prostate cancer ([place]) 04/2010    Past Surgical History:  Procedure Laterality Date  . ANTERIOR CERVICAL DECOMP/DISCECTOMY FUSION  05/26/06  . BACK SURGERY    . CARDIAC CATHETERIZATION  11/20/1999  . CORONARY ARTERY BYPASS GRAFT  11/20/1999   SVG-RI1-RI2, SVG-OM, SVG-dRCA  . KNEE ARTHROSCOPY Right 1992; 04/2008  . LAPAROSCOPIC CHOLECYSTECTOMY  2007  . NASAL SEPTUM SURGERY Right 2007  . ROBOT ASSISTED LAPAROSCOPIC RADICAL PROSTATECTOMY  04/2010  . THYROIDECTOMY, PARTIAL  1980    There were no vitals filed for this visit.  Subjective Assessment - 02/02/17 1022    Subjective  exercises are still  causing pain; but otherwise neck is doing real well.  using traction pillow 1-2 times a day which seems to be helping.  imaging showed arthritis only; fusion is stable.  band exercises aggravated Rt shoulder (old injury)    Pertinent History  neck surgery: 2007, Rt foot fracture 6 weeks ago    Patient Stated Goals  reduce neck pain    Currently in Pain?  Yes    Pain Score  1     Pain Location  Neck    Pain Orientation  Right    Pain Descriptors / Indicators  Squeezing    Pain Type  Acute pain;Chronic pain    Pain Onset  More than a month ago    Pain Frequency  Constant    Aggravating Factors   lying in bed    Pain Relieving Factors  massage, heat                      [facility] Adult PT Treatment/Exercise - 02/02/17 0001      Neck Exercises: Seated   Shoulder Rolls  Backwards;15 reps    Other Seated Exercise  scapular retraction 5 sec x 10 reps      Neck Exercises: Supine   Neck Retraction  10 reps;5  secs    Other Supine Exercise  scapular retraction x 10 reps      Manual Therapy   Manual Therapy  Soft tissue mobilization;Myofascial release    Manual therapy comments  pt supine    Soft tissue mobilization  bil suboccipitals; Rt upper trap and levator    Myofascial Release  suboccipital release; and TPR to Rt upper trap and levator scapula      Neck Exercises: Stretches   Other Neck Stretches  chest stretch sidelying with 1UE rotation 5x10 sec bil             PT Education - 02/02/17 1059    Education provided  Yes    Education Details  HEP    Person(s) Educated  Patient    Methods  Explanation;Demonstration;Handout    Comprehension  Verbalized understanding;Returned demonstration;Need further instruction       PT Short Term Goals - 01/20/17 1058      PT SHORT TERM GOAL #1   Title  be independent in initial HEP    Time  4    Period  Weeks    Status  New    [place] Date  02/17/17      PT SHORT TERM GOAL #2   Title  demonstrate 60 degrees of cervical  A/ROM bil to improve safety with driving    Time  4    Status  New    Target Date  02/17/17      PT SHORT TERM GOAL #3   Title  report < or = to 5/10 max neck pain with ADLs and self-care    Time  4    Period  Weeks    Status  New    Target Date  02/17/17        PT Long Term Goals - 01/20/17 1105      PT LONG TERM GOAL #1   Title  be independent in advanced HEP    Time  8    Period  Weeks    Target Date  03/17/17      PT LONG TERM GOAL #2   Title  demonstrate 70 degrees bil cervical A/ROM rotation to improve safety with driving    Time  8    Target Date  03/17/17      PT LONG TERM GOAL #3   Title  report < or = to 3/10 max neck pain with ADLs and self-care    Time  8    Period  Weeks    Target Date  03/17/17      PT LONG TERM GOAL #4   Title  ^^            Plan - 02/02/17 1218    Clinical Impression Statement  Pt declined DN today and continuenstructed to stop current HEP and new HEP provided today.  Will continue to benefit from PT to maximize function.    PT Treatment/Interventions  ADLs/Self Care Home Management;Cryotherapy;Electrical Stimulation;Functional mobility training;Traction;Moist Heat;Therapeutic activities;Therapeutic exercise;Patient/family education;Passive range of motion;Manual techniques;Dry needling;Vasopneumatic Device;Taping    PT Next Visit Plan  postural strength, manual to neck/dry needling, cervical A/ROM    Consulted and Agree with Plan of Care  Patient       Patient will benefit from skilled therapeutic intervention in order to improve the following deficits and impairments:  Postural dysfunction, Impaired flexibility, Pain, Increased muscle spasms, Decreased range of motion  Visit Diagnosis: Cervicalgia  Abnormal posture  Other muscle spasm  Problem List Patient Active Problem List   Diagnosis Date Noted  . Venous insufficiency 12/29/2016  . Ganglion cyst of tendon sheath of right hand 12/29/2016  . Avulsion fracture  of metatarsal bone of right foot 11/25/2016  . Gout 11/20/2016  . Dyslipidemia 09/23/2016  . Obesity, Class I, BMI 30-34.9 10/03/2015  . Hereditary and idiopathic peripheral neuropathy 09/25/2014  . Allergic rhinitis 05/31/2014  . Fatigue 11/09/2013  . CKD (chronic kidney disease), stage III ([place]) 10/23/2013  . Dizziness 08/23/2013  . Neuropathy ([place]) 08/21/2013  . RLS (restless legs syndrome) 01/03/2013  . Low back pain radiating to left leg 10/09/2011  . History of prostate cancer. Gleason 6 in 2012 04/01/2010  . OSTEOARTHRITIS, LOWER LEG, LEFT 10/04/2009  . TOBACCO ABUSE, HX OF 12/26/2008  . ANEMIA, IRON DEFICIENCY 11/06/2008  . 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 Essential hypertension 02/21/2007  . OSA (obstructive sleep apnea) 11/02/2006  . Hyperlipidemia 10/12/2006  . DEPRESSION 10/12/2006  . Coronary atherosclerosis 10/12/2006  . Asthma 10/12/2006  . GERD 10/12/2006  . Irritable bowel syndrome 10/12/2006      Laureen Abrahams, PT, DPT 02/02/17 12:19 PM    [facility] [place], [place], [address] Phone: [phone]   Fax:  [phone]  Name: Joshua [name] MRN: [id number] Date of Birth: 1940/12/02

## 2017-02-04 ENCOUNTER — Encounter: Payer: Medicare Other | Admitting: Physical Therapy

## 2017-02-04 ENCOUNTER — Ambulatory Visit (INDEPENDENT_AMBULATORY_CARE_PROVIDER_SITE_OTHER): Payer: Medicare Other | Admitting: Physical Therapy

## 2017-02-04 ENCOUNTER — Encounter: Payer: Self-pay | Admitting: Physical Therapy

## 2017-02-04 DIAGNOSIS — M62838 Other muscle spasm: Secondary | ICD-10-CM

## 2017-02-04 DIAGNOSIS — M542 Cervicalgia: Secondary | ICD-10-CM

## 2017-02-04 DIAGNOSIS — R293 Abnormal posture: Secondary | ICD-10-CM | POA: Diagnosis not present

## 2017-02-04 NOTE — Therapy (Signed)
[facility] [address] [place], [place], [address] Phone: [phone]   Fax:  [phone]  Physical Therapy Treatment/Discharge  Patient Details  Name: Joshua [name] MRN: [id number] Date of Birth: [birth year] Referring Provider: Ellouise Newer, MD   Encounter Date: 02/04/2017  PT End of Session - 02/04/17 1016    Visit Number  4    Date for PT Re-Evaluation  03/17/17    PT Start Time  0930    PT Stop Time  1010    PT Time Calculation (min)  40 min    Activity Tolerance  Patient tolerated treatment well    Behavior During Therapy  [facility] for tasks assessed/performed       Past Medical History:  Diagnosis Date  . Acute medial meniscal tear   . Anemia   . Arthritis    "middle finger right hand; right knee; neck" (02/06/2014)  . Asthma   . Bladder cancer ([place]) 04/2010   "cauterized during prostate OR"  . CAD (coronary artery disease)    CABG 2001  . Depression   . Diverticulosis   . DJD (degenerative joint disease)   . GERD (gastroesophageal reflux disease)   . History of blood transfusion 2001   "related to the bypass OR"  . History of gout   . Hyperlipidemia   . Hypertension   . IBS (irritable bowel syndrome)   . Obesity   . OSA (obstructive sleep apnea)   . Pancreatitis ~ 1980  . Pneumonia 1980's X 2  . Prostate cancer ([place]) 04/2010    Past Surgical History:  Procedure Laterality Date  . ANTERIOR CERVICAL DECOMP/DISCECTOMY FUSION  05/26/06  . BACK SURGERY    . CARDIAC CATHETERIZATION  11/20/1999  . CORONARY ARTERY BYPASS GRAFT  11/20/1999   SVG-RI1-RI2, SVG-OM, SVG-dRCA  . KNEE ARTHROSCOPY Right 1992; 04/2008  . LAPAROSCOPIC CHOLECYSTECTOMY  2007  . NASAL SEPTUM SURGERY Right 2007  . ROBOT ASSISTED LAPAROSCOPIC RADICAL PROSTATECTOMY  04/2010  . THYROIDECTOMY, PARTIAL  1980    There were no vitals filed for this visit.  Subjective Assessment - 02/04/17 0935    Subjective  traction pillow and exercises seem to  be helping.  requesting d/c today.  no pain > 3/10 for past few days.    Pertinent History  neck surgery: 2007, Rt foot fracture 6 weeks ago    Patient Stated Goals  reduce neck pain    Currently in Pain?  Yes    Pain Score  3     Pain Location  Neck    Pain Orientation  Right    Pain Descriptors / Indicators  Squeezing    Pain Type  Acute pain;Chronic pain    Pain Onset  More than a month ago    Pain Frequency  Constant    Aggravating Factors   lying in bed    Pain Relieving Factors  massage, heat         [facility] PT Assessment - 02/04/17 0945      AROM   Cervical - Right Rotation  55    Cervical - Left Rotation  48                  [facility] Adult PT Treatment/Exercise - 02/04/17 0938      Neck Exercises: Seated   Shoulder Rolls  Backwards;15 reps    Other Seated Exercise  scapular retraction 5 sec x 10 reps      Neck Exercises: Supine  Neck Retraction  10 reps;5 secs    Other Supine Exercise  scapular retraction x 10 reps      Manual Therapy   Manual Therapy  Soft tissue mobilization;Myofascial release    Manual therapy comments  pt supine    Soft tissue mobilization  bil suboccipitals; Rt upper trap and levator    Myofascial Release  suboccipital release; and TPR to Rt upper trap and levator scapula      Neck Exercises: Stretches   Other Neck Stretches  chest stretch sidelying with 1UE rotation 5x10 sec bil             PT Education - 2017-02-19 1016    Education provided  Yes    Education Details  continue HEP - pt agreeable    Person(s) Educated  Patient    Methods  Explanation;Demonstration    Comprehension  Verbalized understanding;Returned demonstration       PT Short Term Goals - 02/19/17 1017      PT SHORT TERM GOAL #1   Title  be independent in initial HEP    Status  Achieved      PT SHORT TERM GOAL #2   Title  demonstrate 60 degrees of cervical A/ROM bil to improve safety with driving    Status  Not Met      PT SHORT TERM GOAL #3    Title  report < or = to 5/10 max neck pain with ADLs and self-care    Status  Achieved        PT Long Term Goals - 02-19-2017 1017      PT LONG TERM GOAL #1   Title  be independent in advanced HEP    Status  Achieved      PT LONG TERM GOAL #2   Title  demonstrate 70 degrees bil cervical A/ROM rotation to improve safety with driving    Status  Not Met      PT LONG TERM GOAL #3   Title  report < or = to 3/10 max neck pain with ADLs and self-care    Status  Achieved            Plan - 2017/02/19 1018    Clinical Impression Statement  Pt has met 2/3 LTGs and is requesting d/c today.  He feels his HEP is helping to decrease pain as well as traction pillow.  Advised pt if pain persists or worsens to ask for new referral.  Pt in agreement.    PT Treatment/Interventions  ADLs/Self Care Home Management;Cryotherapy;Electrical Stimulation;Functional mobility training;Traction;Moist Heat;Therapeutic activities;Therapeutic exercise;Patient/family education;Passive range of motion;Manual techniques;Dry needling;Vasopneumatic Device;Taping    PT Next Visit Plan  d/c PT today    Consulted and Agree with Plan of Care  Patient       Patient will benefit from skilled therapeutic intervention in order to improve the following deficits and impairments:  Postural dysfunction, Impaired flexibility, Pain, Increased muscle spasms, Decreased range of motion  Visit Diagnosis: Cervicalgia  Abnormal posture  Other muscle spasm   [facility] PT PB G-CODES - 19-Feb-2017 1020    Functional Assessment Tool Used   clinical judgement    Functional Limitations  Other PT primary    Other PT Primary Goal Status (Y6599)  At least 20 percent but less than 40 percent impaired, limited or restricted    Other PT Primary Discharge Status (J5701)  At least 20 percent but less than 40 percent impaired, limited or restricted  Problem List Patient Active Problem List   Diagnosis Date Noted  . Venous insufficiency  12/29/2016  . Ganglion cyst of tendon sheath of right hand 12/29/2016  . Avulsion fracture of metatarsal bone of right foot 11/25/2016  . Gout 11/20/2016  . Dyslipidemia 09/23/2016  . Obesity, Class I, BMI 30-34.9 10/03/2015  . Hereditary and idiopathic peripheral neuropathy 09/25/2014  . Allergic rhinitis 05/31/2014  . Fatigue 11/09/2013  . CKD (chronic kidney disease), stage III ([place]) 10/23/2013  . Dizziness 08/23/2013  . Neuropathy ([place]) 08/21/2013  . RLS (restless legs syndrome) 01/03/2013  . Low back pain radiating to left leg 10/09/2011  . History of prostate cancer. Gleason 6 in 2012 04/01/2010  . OSTEOARTHRITIS, LOWER LEG, LEFT 10/04/2009  . TOBACCO ABUSE, HX OF 12/26/2008  . ANEMIA, IRON DEFICIENCY 11/06/2008  . 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 Essential hypertension 02/21/2007  . OSA (obstructive sleep apnea) 11/02/2006  . Hyperlipidemia 10/12/2006  . DEPRESSION 10/12/2006  . Coronary atherosclerosis 10/12/2006  . Asthma 10/12/2006  . GERD 10/12/2006  . Irritable bowel syndrome 10/12/2006      Laureen Abrahams, PT, DPT 02/04/17 10:21 AM    [facility] [place], [place], [address] Phone: [phone]   Fax:  [phone]  Name: GILLIS BOARDLEY MRN: [id number] Date of Birth: January 09, 1941     PHYSICAL THERAPY DISCHARGE SUMMARY  Visits from Start of Care: 4  Current functional level related to goals / functional outcomes: See above   Remaining deficits: See above; pt doing well overall and requesting d/c at this time   Education / Equipment: HEP  Plan: Patient agrees to discharge.  Patient goals were partially met. Patient is being discharged due to the patient's request.  ?????     Laureen Abrahams, PT, DPT 02/04/17 10:22 AM   [facility] [address] [place], [place], [address] Phone: [phone]  Fax: [phone]

## 2017-02-08 ENCOUNTER — Encounter: Payer: Self-pay | Admitting: Sports Medicine

## 2017-02-08 DIAGNOSIS — M542 Cervicalgia: Secondary | ICD-10-CM | POA: Insufficient documentation

## 2017-02-08 NOTE — Assessment & Plan Note (Signed)
Patient incidentally reports having generalized neck pain.  Recommend looking into home traction device and if any lack of improvement will consider further diagnostic evaluation.  He has been referred for physical therapy and I will defer to their expertise at this time.

## 2017-02-08 NOTE — Assessment & Plan Note (Signed)
Foot continues to do quite well from the base of the fifth metatarsal standpoint but he continues to have neuropathic pain.

## 2017-02-08 NOTE — Assessment & Plan Note (Signed)
He has a very small ganglion.  Essentially asymptomatic.  Incidental finding

## 2017-02-08 NOTE — Assessment & Plan Note (Signed)
Patient has still been intolerant of compression socks due to underlying neuropathy.  Recommend trying a lower level compression.  Did recommend he follow-up with the medical supply company and discuss finally with insurance given this was a prescription and is for appropriate level of compression for this to be considered a medical grade device

## 2017-02-09 ENCOUNTER — Encounter: Payer: Medicare Other | Admitting: Physical Therapy

## 2017-02-16 DIAGNOSIS — J3089 Other allergic rhinitis: Secondary | ICD-10-CM | POA: Diagnosis not present

## 2017-02-16 DIAGNOSIS — J301 Allergic rhinitis due to pollen: Secondary | ICD-10-CM | POA: Diagnosis not present

## 2017-03-01 ENCOUNTER — Ambulatory Visit: Payer: Self-pay | Admitting: *Deleted

## 2017-03-01 NOTE — Telephone Encounter (Signed)
Flank pain, possibly kidney stones per patient.  Home care advice given to patient.  Appointment made. Will go to the ED if experiencing more pain or difficulty urinating.  Reason for Disposition . Side (flank) or lower back pain present  Answer Assessment - Initial Assessment Questions 1. SYMPTOM: "What's the main symptom you're concerned about?" (e.g., frequency, incontinence)     Painful urination 2. ONSET: "When did the  ________  start?"     This morning 3. PAIN: "Is there any pain?" If so, ask: "How bad is it?" (Scale: 1-10; mild, moderate, severe)     5 4. CAUSE: "What do you think is causing the symptoms?"     Kidney stone 5. OTHER SYMPTOMS: "Do you have any other symptoms?" (e.g., fever, flank pain, blood in urine, pain with urination)     Flank pain, pain with urination 6. PREGNANCY: "Is there any chance you are pregnant?" "When was your last menstrual period?"     n/a  Protocols used: URINARY [facility]

## 2017-03-02 ENCOUNTER — Ambulatory Visit (INDEPENDENT_AMBULATORY_CARE_PROVIDER_SITE_OTHER): Payer: Medicare Other | Admitting: Urgent Care

## 2017-03-02 ENCOUNTER — Telehealth: Payer: Self-pay | Admitting: Family Medicine

## 2017-03-02 ENCOUNTER — Encounter: Payer: Self-pay | Admitting: Urgent Care

## 2017-03-02 VITALS — BP 128/82 | HR 88 | Ht 67.0 in | Wt 212.4 lb

## 2017-03-02 DIAGNOSIS — Z8551 Personal history of malignant neoplasm of bladder: Secondary | ICD-10-CM

## 2017-03-02 DIAGNOSIS — R109 Unspecified abdominal pain: Secondary | ICD-10-CM

## 2017-03-02 DIAGNOSIS — Z8546 Personal history of malignant neoplasm of prostate: Secondary | ICD-10-CM

## 2017-03-02 DIAGNOSIS — R300 Dysuria: Secondary | ICD-10-CM

## 2017-03-02 DIAGNOSIS — N3090 Cystitis, unspecified without hematuria: Secondary | ICD-10-CM | POA: Diagnosis not present

## 2017-03-02 DIAGNOSIS — I2510 Atherosclerotic heart disease of native coronary artery without angina pectoris: Secondary | ICD-10-CM

## 2017-03-02 LAB — POCT URINALYSIS DIPSTICK
Bilirubin, UA: NEGATIVE
Glucose, UA: NEGATIVE
Ketones, UA: NEGATIVE
Nitrite, UA: NEGATIVE
Protein, UA: NEGATIVE
Spec Grav, UA: 1.015 (ref 1.010–1.025)
Urobilinogen, UA: 0.2 E.U./dL
pH, UA: 7 (ref 5.0–8.0)

## 2017-03-02 MED ORDER — CEPHALEXIN 500 MG PO CAPS: 500.0000 mg | ORAL_CAPSULE | Freq: Three times a day (TID) | ORAL | 0 refills | Status: DC

## 2017-03-02 NOTE — Telephone Encounter (Signed)
Copied from [place] [id number]. Topic: Quick Communication - See Telephone Encounter >> Mar 02, 2017  3:38 PM Synthia Innocent wrote: CRM for notification. See Telephone encounter for:  Patient states pharmacy would not fill cephALEXin (KEFLEX) 500 MG capsule. They state due to a allergy. Please asvise

## 2017-03-02 NOTE — Patient Instructions (Signed)
Urinary Tract Infection, Adult A urinary tract infection (UTI) is an infection of any part of the urinary tract, which includes the kidneys, ureters, bladder, and urethra. These organs make, store, and get rid of urine in the body. UTI can be a bladder infection (cystitis) or kidney infection (pyelonephritis). What are the causes? This infection may be caused by fungi, viruses, or bacteria. Bacteria are the most common cause of UTIs. This condition can also be caused by repeated incomplete emptying of the bladder during urination. What increases the risk? This condition is more likely to develop if:  You ignore your need to urinate or hold urine for long periods of time.  You do not empty your bladder completely during urination.  You wipe back to front after urinating or having a bowel movement, if you are male.  You are uncircumcised, if you are male.  You are constipated.  You have a urinary catheter that stays in place (indwelling).  You have a weak defense (immune) system.  You have a medical condition that affects your bowels, kidneys, or bladder.  You have diabetes.  You take antibiotic medicines frequently or for long periods of time, and the antibiotics no longer work well against certain types of infections (antibiotic resistance).  You take medicines that irritate your urinary tract.  You are exposed to chemicals that irritate your urinary tract.  You are male. What are the signs or symptoms? Symptoms of this condition include:  Fever.  Frequent urination or passing small amounts of urine frequently.  Needing to urinate urgently.  Pain or burning with urination.  Urine that smells bad or unusual.  Cloudy urine.  Pain in the lower abdomen or back.  Trouble urinating.  Blood in the urine.  Vomiting or being less hungry than normal.  Diarrhea or abdominal pain.  Vaginal discharge, if you are male. How is this diagnosed? This condition is  diagnosed with a medical history and physical exam. You will also need to provide a urine sample to test your urine. Other tests may be done, including:  Blood tests.  Sexually transmitted disease (STD) testing. If you have had more than one UTI, a cystoscopy or imaging studies may be done to determine the cause of the infections. How is this treated? Treatment for this condition often includes a combination of two or more of the following:  Antibiotic medicine.  Other medicines to treat less common causes of UTI.  Over-the-counter medicines to treat pain.  Drinking enough water to stay hydrated. Follow these instructions at home:  Take over-the-counter and prescription medicines only as told by your health care provider.  If you were prescribed an antibiotic, take it as told by your health care provider. Do not stop taking the antibiotic even if you start to feel better.  Avoid alcohol, caffeine, tea, and carbonated beverages. They can irritate your bladder.  Drink enough fluid to keep your urine clear or pale yellow.  Keep all follow-up visits as told by your health care provider. This is important.  Make sure to:  Empty your bladder often and completely. Do not hold urine for long periods of time.  Empty your bladder before and after sex.  Wipe from front to back after a bowel movement if you are male. Use each tissue one time when you wipe. Contact a health care provider if:  You have back pain.  You have a fever.  You feel nauseous or vomit.  Your symptoms do not get better after 3   days.  Your symptoms go away and then return. Get help right away if:  You have severe back pain or lower abdominal pain.  You are vomiting and cannot keep down any medicines or water. This information is not intended to replace advice given to you by your health care provider. Make sure you discuss any questions you have with your health care provider. Document Released:  12/17/2004 Document Revised: 08/21/2015 Document Reviewed: 01/28/2015 Elsevier Interactive Patient Education  2017 Elsevier Inc.  

## 2017-03-02 NOTE — Progress Notes (Signed)
MRN: [id number] DOB: [birth year]  Subjective:   Joshua [name] is a 76 y.o. male presenting for 2 day history of dysuria, urinary frequency, urinary urgency, cloudy malordorous urine and bilateral flank pain. Has tried APAP with some relief of his flank pain. Has not hydrated well, drinks tea, coffee. Has a history of bladder cancer and prostate cancer, s/p surgical removal in 2012. Has been in remission. Denies history of frequent UTIs, prostatitis. Denies fever, abdominal pain, pelvic pain and genital rash, nausea and vomiting.   Joshua [name] has a current medication list which includes the following prescription(s): acetaminophen, albuterol, aspirin ec, cyanocobalamin, desloratadine, diazepam, diclofenac sodium, evolocumab, flovent hfa, fluticasone, folic acid, gabapentin, irbesartan, polyethylene glycol powder, align, simvastatin, and hydrocortisone. Patient is allergic to benadryl [diphenhydramine]; bromfed; clarithromycin; codeine; doxycycline hyclate; modafinil; oxycodone-acetaminophen; promethazine hcl; quinolones; telithromycin; hydrocodone-acetaminophen; and sulfonamide derivatives.  Joshua [name]  has a past medical history of Acute medial meniscal tear, Anemia, Arthritis, Asthma, Bladder cancer ([place]) (04/2010), CAD (coronary artery disease), Depression, Diverticulosis, DJD (degenerative joint disease), GERD (gastroesophageal reflux disease), History of blood transfusion (2001), History of gout, Hyperlipidemia, Hypertension, IBS (irritable bowel syndrome), Obesity, OSA (obstructive sleep apnea), Pancreatitis (~ 1980), Pneumonia (1980's X 2), and Prostate cancer ([place]) (04/2010). Also  has a past surgical history that includes Thyroidectomy, partial (1980); Anterior cervical decomp/discectomy fusion (05/26/06); Knee arthroscopy (Right, 1992; 04/2008); Robot assisted laparoscopic radical prostatectomy (04/2010); Nasal septum surgery (Right, 2007); Back surgery; Laparoscopic cholecystectomy (2007); Cardiac  catheterization (11/20/1999); and Coronary artery bypass graft (11/20/1999).  Objective:   Vitals: BP 128/82 (BP Location: Left Arm, Patient Position: Sitting, Cuff Size: Normal)   Pulse 88   Ht 5\' 7"  (1.702 m)   Wt 212 lb 6.4 oz (96.3 kg)   SpO2 96%   BMI 33.27 kg/m   Physical Exam  Constitutional: He is oriented to person, place, and time. He appears well-developed and well-nourished.  Cardiovascular: Normal rate, regular rhythm and intact distal pulses. Exam reveals no gallop and no friction rub.  No murmur heard. Pulmonary/Chest: No respiratory distress. He has no wheezes. He has no rales.  Abdominal: Soft. Bowel sounds are normal. He exhibits no distension and no mass. There is tenderness (generalized through midline). There is no guarding.  No CVA tenderness.  Neurological: He is alert and oriented to person, place, and time.  Skin: Skin is warm and dry.    Results for orders placed or performed in visit on 03/02/17 (from the past 24 hour(s))  POCT urinalysis dipstick     Status: Abnormal   Collection Time: 03/02/17  1:07 PM  Result Value Ref Range   Color, UA yellow    Clarity, UA clear    Glucose, UA negative    Bilirubin, UA negative    Ketones, UA negative    Spec Grav, UA 1.015 1.010 - 1.025   Blood, UA small    pH, UA 7.0 5.0 - 8.0   Protein, UA negative    Urobilinogen, UA 0.2 0.2 or 1.0 E.U./dL   Nitrite, UA negative    Leukocytes, UA Trace (A) Negative   Appearance     Odor     Assessment and Plan :   1. Dysuria 2. Cystitis 3. Flank pain - Will cover for infectious process with Keflex given age, allergies to antibiotics. Advised much better hydration and avoiding caffeine drinks. Return-to-clinic precautions discussed, patient verbalized understanding.  - Urine Culture pending.  4. History of prostate cancer 5. History of bladder cancer - Patient is to get  back in touch with his PCP, urologist, oncologist for recheck. He agreed to set up an  appointment.  Jaynee Eagles, PA-C [facility] [phone] 03/02/2017 12:44 PM

## 2017-03-03 ENCOUNTER — Encounter: Payer: Self-pay | Admitting: Family Medicine

## 2017-03-03 ENCOUNTER — Ambulatory Visit (INDEPENDENT_AMBULATORY_CARE_PROVIDER_SITE_OTHER): Payer: Medicare Other | Admitting: Family Medicine

## 2017-03-03 VITALS — BP 145/85 | HR 70 | Temp 97.4°F | Ht 67.0 in | Wt 214.0 lb

## 2017-03-03 DIAGNOSIS — N390 Urinary tract infection, site not specified: Secondary | ICD-10-CM | POA: Diagnosis not present

## 2017-03-03 MED ORDER — CEPHALEXIN 500 MG PO CAPS: 500.0000 mg | ORAL_CAPSULE | Freq: Two times a day (BID) | ORAL | 0 refills | Status: DC

## 2017-03-03 MED ORDER — CEFTRIAXONE SODIUM 1 G IJ SOLR
1.0000 g | Freq: Once | INTRAMUSCULAR | Status: AC
  Administered 2017-03-03: 1 g via INTRAMUSCULAR

## 2017-03-03 MED ORDER — CEFTRIAXONE SODIUM 1 G IJ SOLR: 1.0000 g | Freq: Once | INTRAMUSCULAR | Status: DC

## 2017-03-03 NOTE — Patient Instructions (Signed)
Start the [employer]  I will send Dr hunter a note so that we can follow up on your urine culture.  Take care,  Dr Jerline Pain

## 2017-03-03 NOTE — Progress Notes (Signed)
    Subjective:  Joshua [name] is a 76 y.o. male who presents today with a chief complaint of Per HPI.   HPI:  Dysuria, acute issue Symptoms started 3 days ago. Worsened over that time. Associated symptoms include urinary urgency, malodor, back pain, and flank pain.  Seen at urgent care yesterday and was diagnosed with a UTI.  He was given a prescription for Keflex which was unable to be filled due to concern for allergy.  No fevers.  No chills.  No treatments tried.  No obvious alleviating or aggravating factors.  ROS: Per HPI  PMH: Smoking history reviewed.  Former smoker.  Objective:  Physical Exam: BP (!) 145/85   Pulse 70   Temp (!) 97.4 F (36.3 C) (Oral)   Ht 5\' 7"  (1.702 m)   Wt 214 lb (97.1 kg)   SpO2 97%   BMI 33.52 kg/m   Gen: NAD, resting comfortably, nontoxic appearing CV: RRR with no murmurs appreciated Pulm: NWOB, CTAB with no crackles, wheezes, or rhonchi GI: Normal bowel sounds present. Soft, Nontender, Nondistended. MSK: No CVA tenderness  Results for orders placed or performed in visit on 03/02/17 (from the past 72 hour(s))  POCT urinalysis dipstick     Status: Abnormal   Collection Time: 03/02/17  1:07 PM  Result Value Ref Range   Color, UA yellow    Clarity, UA clear    Glucose, UA negative    Bilirubin, UA negative    Ketones, UA negative    Spec Grav, UA 1.015 1.010 - 1.025   Blood, UA small    pH, UA 7.0 5.0 - 8.0   Protein, UA negative    Urobilinogen, UA 0.2 0.2 or 1.0 E.U./dL   Nitrite, UA negative    Leukocytes, UA Trace (A) Negative   Appearance     Odor      Assessment/Plan:  UTI Symptoms and UA yesterday consistent with UTI.  Unclear why he was told that he cannot take Keflex-this is not on his allergy list he has never had allergic reaction to penicillin or cephalosporins in the past.  We will give 1 g of ceftriaxone today.  Start oral Keflex tomorrow.  No current signs of sepsis or systemic illness.  Strict return precautions  reviewed.  Encouraged good oral hydration.  Follow-up as needed.  Algis Greenhouse. Jerline Pain, MD 03/03/2017 12:21 PM

## 2017-03-04 ENCOUNTER — Ambulatory Visit: Payer: Medicare Other | Admitting: Family Medicine

## 2017-03-05 LAB — URINE CULTURE

## 2017-03-08 DIAGNOSIS — J3089 Other allergic rhinitis: Secondary | ICD-10-CM | POA: Diagnosis not present

## 2017-03-08 DIAGNOSIS — J3081 Allergic rhinitis due to animal (cat) (dog) hair and dander: Secondary | ICD-10-CM | POA: Diagnosis not present

## 2017-03-08 DIAGNOSIS — J301 Allergic rhinitis due to pollen: Secondary | ICD-10-CM | POA: Diagnosis not present

## 2017-03-11 DIAGNOSIS — D3612 Benign neoplasm of peripheral nerves and autonomic nervous system, upper limb, including shoulder: Secondary | ICD-10-CM | POA: Diagnosis not present

## 2017-03-11 DIAGNOSIS — D1801 Hemangioma of skin and subcutaneous tissue: Secondary | ICD-10-CM | POA: Diagnosis not present

## 2017-03-17 ENCOUNTER — Telehealth: Payer: Self-pay | Admitting: Pulmonary Disease

## 2017-03-17 DIAGNOSIS — F32A Depression, unspecified: Secondary | ICD-10-CM | POA: Insufficient documentation

## 2017-03-17 DIAGNOSIS — K5790 Diverticulosis of intestine, part unspecified, without perforation or abscess without bleeding: Secondary | ICD-10-CM | POA: Insufficient documentation

## 2017-03-17 DIAGNOSIS — I2510 Atherosclerotic heart disease of native coronary artery without angina pectoris: Secondary | ICD-10-CM | POA: Insufficient documentation

## 2017-03-17 DIAGNOSIS — E669 Obesity, unspecified: Secondary | ICD-10-CM | POA: Insufficient documentation

## 2017-03-17 DIAGNOSIS — F329 Major depressive disorder, single episode, unspecified: Secondary | ICD-10-CM | POA: Insufficient documentation

## 2017-03-17 DIAGNOSIS — D649 Anemia, unspecified: Secondary | ICD-10-CM | POA: Insufficient documentation

## 2017-03-17 DIAGNOSIS — G4733 Obstructive sleep apnea (adult) (pediatric): Secondary | ICD-10-CM

## 2017-03-17 DIAGNOSIS — M1990 Unspecified osteoarthritis, unspecified site: Secondary | ICD-10-CM | POA: Insufficient documentation

## 2017-03-17 DIAGNOSIS — K589 Irritable bowel syndrome without diarrhea: Secondary | ICD-10-CM | POA: Insufficient documentation

## 2017-03-17 DIAGNOSIS — S83249A Other tear of medial meniscus, current injury, unspecified knee, initial encounter: Secondary | ICD-10-CM | POA: Insufficient documentation

## 2017-03-17 DIAGNOSIS — K219 Gastro-esophageal reflux disease without esophagitis: Secondary | ICD-10-CM | POA: Insufficient documentation

## 2017-03-17 DIAGNOSIS — Z8739 Personal history of other diseases of the musculoskeletal system and connective tissue: Secondary | ICD-10-CM | POA: Insufficient documentation

## 2017-03-17 DIAGNOSIS — J189 Pneumonia, unspecified organism: Secondary | ICD-10-CM | POA: Insufficient documentation

## 2017-03-17 DIAGNOSIS — I10 Essential (primary) hypertension: Secondary | ICD-10-CM | POA: Insufficient documentation

## 2017-03-17 DIAGNOSIS — K859 Acute pancreatitis without necrosis or infection, unspecified: Secondary | ICD-10-CM | POA: Insufficient documentation

## 2017-03-17 NOTE — Telephone Encounter (Signed)
Spoke with patient. He is aware of the new CPAP order. Will go ahead and send order to [place].   Nothing else needed at time of call.

## 2017-03-17 NOTE — Telephone Encounter (Signed)
Spoke with patient. He stated that he woke up this morning and his CPAP machine was very hot to touch. He was on vacation and packed the machine up. When he returned home, machine had cooled down but it would not turn back on.   He called [facility] and they stated that he will need a new CPAP prescription from Dr. Halford Chessman. He has an appt. With [facility] tomorrow for them to troubleshoot the machine.   VS, are you ok with sending a new RX to [facility]? I have placed a download on your desk for review. Thanks!

## 2017-03-17 NOTE — Telephone Encounter (Signed)
Please send order for auto CPAP range 5 to 12 cm H2O with heated humidity.

## 2017-03-18 DIAGNOSIS — D485 Neoplasm of uncertain behavior of skin: Secondary | ICD-10-CM | POA: Diagnosis not present

## 2017-03-18 DIAGNOSIS — J301 Allergic rhinitis due to pollen: Secondary | ICD-10-CM | POA: Diagnosis not present

## 2017-03-18 DIAGNOSIS — L985 Mucinosis of the skin: Secondary | ICD-10-CM | POA: Diagnosis not present

## 2017-03-18 DIAGNOSIS — J3081 Allergic rhinitis due to animal (cat) (dog) hair and dander: Secondary | ICD-10-CM | POA: Diagnosis not present

## 2017-03-18 DIAGNOSIS — J3089 Other allergic rhinitis: Secondary | ICD-10-CM | POA: Diagnosis not present

## 2017-03-18 NOTE — Addendum Note (Signed)
Addended by: Jaynee Eagles on: 03/18/2017 01:03 PM   Modules accepted: Level of Service

## 2017-03-26 ENCOUNTER — Ambulatory Visit (INDEPENDENT_AMBULATORY_CARE_PROVIDER_SITE_OTHER): Payer: PPO | Admitting: Neurology

## 2017-03-26 ENCOUNTER — Encounter: Payer: Self-pay | Admitting: Neurology

## 2017-03-26 ENCOUNTER — Other Ambulatory Visit: Payer: Medicare Other

## 2017-03-26 VITALS — BP 136/72 | HR 72 | Ht 67.0 in | Wt 213.0 lb

## 2017-03-26 DIAGNOSIS — R200 Anesthesia of skin: Secondary | ICD-10-CM

## 2017-03-26 DIAGNOSIS — G609 Hereditary and idiopathic neuropathy, unspecified: Secondary | ICD-10-CM | POA: Diagnosis not present

## 2017-03-26 MED ORDER — GABAPENTIN 100 MG PO CAPS: ORAL_CAPSULE | ORAL | 11 refills | Status: DC

## 2017-03-26 NOTE — Patient Instructions (Addendum)
1. Schedule MRI brain without contrast. We have sent a referral to [facility] for your MRI and they will call you directly to schedule your appt. They are located at [place]. If you need to contact them directly please call [phone].  2. Bloodwork for ESR, CRP. Your provider has requested that you have labwork completed today. Please go to [facility] ([id number]) on the second floor of this building before leaving the office today. You do not need to check in. If you are not called within 15 minutes please check with the front desk.   3. Try increasing gabapentin by 1 capsule more a day 4. Start exercises for meralgia paresthetica 5. Avoid anything that may cause compression of the nerve on the thigh  Recommended Exercises: Cat-cow Equipment needed: none Muscles worked: spine stabilizers, lumbar extensors, abdominals 1. Start on all fours, with your hands directly under your shoulders and knees directly below your hips at 90 degrees. 2. Begin by slowly arching your back, letting your belly sag, and lifting your chest and eyes up to look up at the ceiling. 3. Hold this position for 15 to 30 seconds. 4. Slowly return to starting position. Tuck your pelvis and arch your back in the other direction while you let your head drop down and relax.  5. Hold position for 15 to 30 seconds.  6. Repeat 3 to 5 times.  Lunges Lunges work to build strength in the legs and help improve balance and stability. They can also help stretch tight hip muscles, which may lessen pain. Equipment needed: none Muscles worked: thigh muscles, including quadriceps and hamstrings, as well as glutes and core muscles 1. Stand up tall with hands by your side.  2. Take a large step forward and slowly bend your knees and lower down until your back knee touches the floor. Be sure to take a big enough step so your front knee doesn't go past your toes. 3. Return to starting position and repeat on the  other side. 4. Do 10 to 15 repetitions on each side and complete 3 sets.  Bridges This exercise helps stretch the hip flexors and strengthens the muscles of the core, legs, and butt to improve function and reduce pain. Equipment needed: none Muscles worked: spinal stabilizers, lumbar extensors, abdominals, glutes, hamstrings 1. Start by lying on your back, knees bent and feet flat on the ground. 2. Slowly raise the hips off the ground until the body is in a straight line, pushing the heels into the floor and squeezing the glutes at the top. 3. Hold position for 15 to 30 seconds. Return to starting position and repeat. 4. Repeat 10 to 15 repetitions for 2 to 3 sets.
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
## 2017-03-27 LAB — C-REACTIVE PROTEIN: CRP: 3.1 mg/L (ref <8.0)

## 2017-03-27 LAB — SEDIMENTATION RATE: Sed Rate: 9 mm/h (ref 0–20)

## 2017-03-31 ENCOUNTER — Telehealth: Payer: Self-pay

## 2017-03-31 ENCOUNTER — Encounter: Payer: Self-pay | Admitting: Neurology

## 2017-03-31 DIAGNOSIS — J301 Allergic rhinitis due to pollen: Secondary | ICD-10-CM | POA: Diagnosis not present

## 2017-03-31 DIAGNOSIS — J3081 Allergic rhinitis due to animal (cat) (dog) hair and dander: Secondary | ICD-10-CM | POA: Diagnosis not present

## 2017-03-31 DIAGNOSIS — J3089 Other allergic rhinitis: Secondary | ICD-10-CM | POA: Diagnosis not present

## 2017-03-31 NOTE — Telephone Encounter (Signed)
-----   Message from Cameron Sprang, MD sent at 03/30/2017 12:30 PM EST ----- Pls let him know the bloodwork is normal, no evidence of inflammation seen. Thanks

## 2017-03-31 NOTE — Telephone Encounter (Signed)
Spoke with pt relaying message below.   

## 2017-04-04 ENCOUNTER — Other Ambulatory Visit: Payer: Medicare Other

## 2017-04-04 NOTE — Progress Notes (Signed)
HPI  The patient returns for follow up of CAD.  He had a negative perfusion study in 2015. Since then he has done well.  The patient denies any new symptoms such as chest discomfort, neck or arm discomfort. There has been no new shortness of breath, PND or orthopnea. There have been no reported palpitations, presyncope or syncope.  He has had problems with his joints and leg pain but can do some exercise.    Allergies  Allergen Reactions  . 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 Bromfed Nausea And Vomiting  . Clarithromycin Other (See Comments)     gastritis  . Codeine Other (See Comments)    Trouble breathing  . Doxycycline Hyclate Nausea And Vomiting  . Modafinil Other (See Comments)     anxiety-nervousness  . Oxycodone-Acetaminophen Itching  . Promethazine Hcl Other (See Comments)     fainting  . Quinolones     Cipro  . Telithromycin Nausea And Vomiting  . Hydrocodone-Acetaminophen Rash  . Sulfonamide Derivatives Rash    Current Outpatient Medications  Medication Sig Dispense Refill  . acetaminophen (TYLENOL) 500 MG tablet Take 1,000 mg by mouth every 6 (six) hours as needed for headache (pain). Reported on 07/02/2015    . albuterol (PROAIR HFA) 108 (90 BASE) MCG/ACT inhaler Inhale 1-2 puffs into the lungs every 6 (six) hours as needed for wheezing or shortness of breath. 18 g 3  . ARNUITY ELLIPTA 200 MCG/ACT AEPB     . aspirin EC 81 MG tablet Take 81 mg by mouth daily.    . Cyanocobalamin (VITAMIN B 12 PO) Take 800 mg by mouth daily.    [name] desloratadine (CLARINEX) 5 MG tablet Take 5 mg by mouth at bedtime.     . diazepam (VALIUM) 5 MG tablet Take 1 tablet (5 mg total) by mouth at bedtime as needed. 30 tablet 2  . Evolocumab (REPATHA SURECLICK) 683 MG/ML SOAJ Inject 140 mg into the skin every 14 (fourteen) days. 2 pen 11  . FLOVENT HFA 110 MCG/ACT inhaler Inhale 2 puffs into the lungs 2 (two) times daily.  Reported on 03/22/2015  5  . fluticasone (FLONASE) 50 MCG/ACT nasal spray Place 1 spray into both nostrils 2 (two) times daily.     . folic acid (FOLVITE) 419 MCG tablet Take 400 mcg by mouth daily.    [name] gabapentin (NEURONTIN) 100 MG capsule Take 1 cap in AM, 1 cap at noon, and 5 caps in PM 270 capsule 11  . irbesartan (AVAPRO) 300 MG tablet Take 1 tablet (300 mg total) by mouth daily. 90 tablet 1  . meclizine (ANTIVERT) 25 MG tablet     . polyethylene glycol powder (GLYCOLAX/MIRALAX) powder Take 17 g by mouth daily. 255 g 0  . Probiotic Product (ALIGN) 4 MG CAPS Take 4 mg by mouth daily.     . simvastatin (ZOCOR) 20 MG tablet Take 1 tablet (20 mg total) by mouth at bedtime. 90 tablet 3   Current Facility-Administered Medications  Medication Dose Route Frequency Provider Last Rate Last Dose  . cefTRIAXone (ROCEPHIN) injection 1 g  1 g Intramuscular Once Vivi Barrack, MD        Past Medical History:  Diagnosis Date  . Acute medial meniscal tear   . Anemia   . Arthritis    "middle finger right hand; right knee; neck" (02/06/2014)  . Asthma   .  Bladder cancer ([place]) 04/2010   "cauterized during prostate OR"  . CAD (coronary artery disease)    CABG 2001  . Depression   . Diverticulosis   . DJD (degenerative joint disease)   . GERD (gastroesophageal reflux disease)   . History of blood transfusion 2001   "related to the bypass OR"  . History of gout   . Hyperlipidemia   . Hypertension   . IBS (irritable bowel syndrome)   . Obesity   . OSA (obstructive sleep apnea)   . Pancreatitis ~ 1980  . Pneumonia 1980's X 2  . Prostate cancer ([place]) 04/2010    Past Surgical History:  Procedure Laterality Date  . ANTERIOR CERVICAL DECOMP/DISCECTOMY FUSION  05/26/06  . BACK SURGERY    . CARDIAC CATHETERIZATION  11/20/1999  . CORONARY ARTERY BYPASS GRAFT  11/20/1999   SVG-RI1-RI2, SVG-OM, SVG-dRCA  . KNEE ARTHROSCOPY Right 1992; 04/2008  . LAPAROSCOPIC CHOLECYSTECTOMY  2007  . NASAL SEPTUM  SURGERY Right 2007  . ROBOT ASSISTED LAPAROSCOPIC RADICAL PROSTATECTOMY  04/2010  . THYROIDECTOMY, PARTIAL  1980    ROS:  Positive for  ED.  Otherwise as stated in the HPI and negative for all other systems.   PHYSICAL EXAM BP 120/60   Pulse 82   Ht 5\' 7"  (1.702 m)   Wt 212 lb (96.2 kg)   SpO2 96%   BMI 33.20 kg/m   GENERAL:  Well appearing NECK:  No jugular venous distention, waveform within normal limits, carotid upstroke brisk and symmetric, no bruits, no thyromegaly LUNGS:  Clear to auscultation bilaterally CHEST:  Well healed sternotomy scar. HEART:  PMI not displaced or sustained,S1 and S2 within normal limits, no S3, no S4, no clicks, no rubs, no murmurs ABD:  Flat, positive bowel sounds normal in frequency in pitch, no bruits, no rebound, no guarding, no midline pulsatile mass, no hepatomegaly, no splenomegaly EXT:  2 plus pulses throughout, no edema, no cyanosis no clubbing   EKG:  NA  Lab Results  Component Value Date   CHOL 81 (L) 12/30/2016   TRIG 151 (H) 12/30/2016   HDL 42 12/30/2016   LDLCALC 9 12/30/2016   LDLDIRECT 115.0 06/04/2016    ASSESSMENT AND PLAN  CORONARY ARTERY DISEASE -  The patient has no new sypmtoms.  No further cardiovascular testing is indicated.  We will continue with aggressive risk reduction and meds as listed.  I might  Consider a stress test next year as a routine.   HTN - The blood pressure is at target. No change in medications is indicated. We will continue with therapeutic lifestyle changes (TLC).  DYSLIPIDEMIA - He is now on Repatha.  He is having trouble getting this covered but we are working with him.   OVERWEIGHT - He continues to try to lose weight and we have discussed again diet and exercise.   FATIGUE -  This is improved.  He had his CPAP upgraded.

## 2017-04-05 ENCOUNTER — Ambulatory Visit (INDEPENDENT_AMBULATORY_CARE_PROVIDER_SITE_OTHER): Payer: PPO | Admitting: Cardiology

## 2017-04-05 ENCOUNTER — Encounter: Payer: Self-pay | Admitting: Cardiology

## 2017-04-05 ENCOUNTER — Ambulatory Visit
Admission: RE | Admit: 2017-04-05 | Discharge: 2017-04-05 | Disposition: A | Discharge status: Home / Self Care | Payer: Medicare Other | Source: Ambulatory Visit | Attending: Neurology | Admitting: Neurology

## 2017-04-05 VITALS — BP 120/60 | HR 82 | Ht 67.0 in | Wt 212.0 lb

## 2017-04-05 DIAGNOSIS — E785 Hyperlipidemia, unspecified: Secondary | ICD-10-CM

## 2017-04-05 DIAGNOSIS — I10 Essential (primary) hypertension: Secondary | ICD-10-CM

## 2017-04-05 DIAGNOSIS — I2510 Atherosclerotic heart disease of native coronary artery without angina pectoris: Secondary | ICD-10-CM

## 2017-04-05 DIAGNOSIS — R200 Anesthesia of skin: Secondary | ICD-10-CM | POA: Diagnosis not present

## 2017-04-05 DIAGNOSIS — G609 Hereditary and idiopathic neuropathy, unspecified: Secondary | ICD-10-CM

## 2017-04-05 MED ORDER — SIMVASTATIN 20 MG PO TABS: 20.0000 mg | ORAL_TABLET | Freq: Every day | ORAL | 3 refills | Status: DC

## 2017-04-05 NOTE — Patient Instructions (Signed)
Medication Instructions:  Continue current medications  If you need a refill on your cardiac medications before your next appointment, please call your pharmacy.  Labwork: None Ordered   Testing/Procedures: None Ordered  Follow-Up: Your physician wants you to follow-up in: 1 Year. You should receive a reminder letter in the mail two months in advance. If you do not receive a letter, please call our office [phone].    Thank you for choosing [facility]!!      

## 2017-04-06 ENCOUNTER — Encounter: Payer: Self-pay | Admitting: Family Medicine

## 2017-04-06 ENCOUNTER — Ambulatory Visit (INDEPENDENT_AMBULATORY_CARE_PROVIDER_SITE_OTHER): Payer: PPO | Admitting: Family Medicine

## 2017-04-06 VITALS — BP 136/82 | HR 74 | Temp 97.5°F | Ht 67.0 in | Wt 210.6 lb

## 2017-04-06 DIAGNOSIS — N183 Chronic kidney disease, stage 3 unspecified: Secondary | ICD-10-CM

## 2017-04-06 DIAGNOSIS — I10 Essential (primary) hypertension: Secondary | ICD-10-CM

## 2017-04-06 DIAGNOSIS — E785 Hyperlipidemia, unspecified: Secondary | ICD-10-CM | POA: Diagnosis not present

## 2017-04-06 NOTE — Assessment & Plan Note (Signed)
S: GFR over last 2 years often just below 60 A/P: continue to maximize medical management- avoding nsaids, bp controlled. Lipids controlled. Fortunately no diabetes- though could check a1c in future with hyperglycemia. Also glad off ppi

## 2017-04-06 NOTE — Progress Notes (Signed)
Subjective:  Joshua [name] is a 77 y.o. year old very pleasant male patient who presents for/with See problem oriented charting ROS- no chest pain or shortness of breath. Has had some headaches and left leg pain- working with neurology   Past Medical History-  Patient Active Problem List   Diagnosis Date Noted  . Coronary atherosclerosis 10/12/2006    Priority: High  . Irritable bowel syndrome 10/12/2006    Priority: High  . CKD (chronic kidney disease), stage III ([place]) 10/23/2013    Priority: Medium  . Neuropathy ([place]) 08/21/2013    Priority: Medium  . History of prostate cancer. Gleason 6 in 2012 04/01/2010    Priority: Medium  . Essential hypertension 02/21/2007    Priority: Medium  . Hyperlipidemia 10/12/2006    Priority: Medium  . DEPRESSION 10/12/2006    Priority: Medium  . Asthma 10/12/2006    Priority: Medium  . Obesity, Class I, BMI 30-34.9 10/03/2015    Priority: Low  . Allergic rhinitis 05/31/2014    Priority: Low  . Fatigue 11/09/2013    Priority: Low  . Dizziness 08/23/2013    Priority: Low  . RLS (restless legs syndrome) 01/03/2013    Priority: Low  . Low back pain radiating to left leg 10/09/2011    Priority: Low  . OSTEOARTHRITIS, LOWER LEG, LEFT 10/04/2009    Priority: Low  . TOBACCO ABUSE, HX OF 12/26/2008    Priority: Low  . ANEMIA, IRON DEFICIENCY 11/06/2008    Priority: Low  . 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 OSA (obstructive sleep apnea) 11/02/2006    Priority: Low  . GERD 10/12/2006    Priority: Low  . Pneumonia   . Pancreatitis   . Obesity   . IBS (irritable bowel syndrome)   . Hypertension   . History of gout   . GERD (gastroesophageal reflux disease)   . DJD (degenerative joint disease)   . Diverticulosis   . Depression   . Coronary artery disease involving native coronary artery of native heart without angina pectoris   . Arthritis   . Anemia   . Acute medial meniscal tear   . Neck pain 02/08/2017   . Venous insufficiency 12/29/2016  . Ganglion cyst of tendon sheath of right hand 12/29/2016  . Avulsion fracture of metatarsal bone of right foot 11/25/2016  . Gout 11/20/2016  . Dyslipidemia 09/23/2016  . Hereditary and idiopathic peripheral neuropathy 09/25/2014  . Prostate cancer ([place]) 04/23/2010  . Bladder cancer ([place]) 04/23/2010  . History of blood transfusion 03/24/1999    Medications- reviewed and updated Current Outpatient Medications  Medication Sig Dispense Refill  . acetaminophen (TYLENOL) 500 MG tablet Take 1,000 mg by mouth every 6 (six) hours as needed for headache (pain). Reported on 07/02/2015    . albuterol (PROAIR HFA) 108 (90 BASE) MCG/ACT inhaler Inhale 1-2 puffs into the lungs every 6 (six) hours as needed for wheezing or shortness of breath. 18 g 3  . aspirin EC 81 MG tablet Take 81 mg by mouth daily.    . Cyanocobalamin (VITAMIN B 12 PO) Take 800 mg by mouth daily.    [name] desloratadine (CLARINEX) 5 MG tablet Take 5 mg by mouth at bedtime.     . diazepam (VALIUM) 5 MG tablet Take 1 tablet (5 mg total) by mouth at bedtime as needed. 30 tablet 2  . Evolocumab (REPATHA SURECLICK) 326 MG/ML SOAJ Inject 140 mg into the skin every 14 (fourteen) days. 2  pen 11  . FLOVENT HFA 110 MCG/ACT inhaler Inhale 2 puffs into the lungs 2 (two) times daily. Reported on 03/22/2015  5  . fluticasone (FLONASE) 50 MCG/ACT nasal spray Place 1 spray into both nostrils 2 (two) times daily.     . folic acid (FOLVITE) 614 MCG tablet Take 400 mcg by mouth daily.    [name] gabapentin (NEURONTIN) 100 MG capsule Take 1 cap in AM, 1 cap at noon, and 5 caps in PM 270 capsule 11  . irbesartan (AVAPRO) 300 MG tablet Take 1 tablet (300 mg total) by mouth daily. 90 tablet 1  . polyethylene glycol powder (GLYCOLAX/MIRALAX) powder Take 17 g by mouth daily. 255 g 0  . Probiotic Product (ALIGN) 4 MG CAPS Take 4 mg by mouth daily.     . simvastatin (ZOCOR) 20 MG tablet Take 1 tablet (20 mg total) by mouth at  bedtime. 90 tablet 3   No current facility-administered medications for this visit.    Objective: BP 136/82 (BP Location: Left Arm, Patient Position: Sitting, Cuff Size: Large)   Pulse 74   Temp (!) 97.5 F (36.4 C) (Oral)   Ht 5\' 7"  (1.702 m)   Wt 210 lb 9.6 oz (95.5 kg)   SpO2 95%   BMI 32.98 kg/m  Gen: NAD, resting comfortably CV: RRR no murmurs rubs or gallops Lungs: CTAB no crackles, wheeze, rhonchi Abdomen: soft/nontender/nondistended/normal bowel sounds. obese Ext: no edema Skin: warm, dry, no rash  Assessment/Plan:  Other updates 1. Working with [name] on left leg pain and having some headaches- had brian MRI yesterday which was reassuring with age related changes  -upped gabapentin and started exercises 2. Flovent still working well with asthma- uses albuterol only with illness 3. Sparing valium for sleep 4. Saw cardiology recently- good report- may do stress test next visit  Hypertension S: controlled on irbesartan 300mg  and hctz 25mg .  BP Readings from Last 3 Encounters:  04/06/17 136/82  04/05/17 120/60  03/26/17 136/72  A/P: at blood pressure goal of <140/90. Continue current meds  Hyperlipidemia S: well controlled on simvastatin 20mg  and  repatha- injections at home twice a month (concerned it may not be covered this year- he just submitted forms)- and simvastatin 40mg . No myalgias.  Lab Results  Component Value Date   CHOL 81 (L) 12/30/2016   HDL 42 12/30/2016   LDLCALC 9 12/30/2016   LDLDIRECT 115.0 06/04/2016   TRIG 151 (H) 12/30/2016   CHOLHDL 1.9 12/30/2016   A/P: continue current medicines- he will update [place] if not covered (repatha)  CKD (chronic kidney disease), stage III ([facility]) S: GFR over last 2 years often just below 60 A/P: continue to maximize medical management- avoding nsaids, bp controlled. Lipids controlled. Fortunately no diabetes- though could check a1c in future with hyperglycemia. Also glad off ppi  Future Appointments  Date  Time Provider [place]  07/06/2017 10:15 AM [name], MD [facility]  07/20/2017  8:30 AM Cameron Sprang, MD [facility] None   Return in about 3 months (around 07/05/2017).  Return precautions advised.  [name], MD

## 2017-04-06 NOTE — Assessment & Plan Note (Signed)
S: well controlled on simvastatin 20mg  and  repatha- injections at home twice a month (concerned it may not be covered this year- he just submitted forms)- and simvastatin 40mg . No myalgias.  Lab Results  Component Value Date   CHOL 81 (L) 12/30/2016   HDL 42 12/30/2016   LDLCALC 9 12/30/2016   LDLDIRECT 115.0 06/04/2016   TRIG 151 (H) 12/30/2016   CHOLHDL 1.9 12/30/2016   A/P: continue current medicines- he will update [place] if not covered (repatha)

## 2017-04-06 NOTE — Patient Instructions (Signed)
No changes today  Consider bloodwork next visit- non fasting

## 2017-04-07 ENCOUNTER — Telehealth: Payer: Self-pay

## 2017-04-07 NOTE — Telephone Encounter (Signed)
Message below relayed via MyChart.  

## 2017-04-07 NOTE — Telephone Encounter (Signed)
-----   Message from Cameron Sprang, MD sent at 04/06/2017  9:54 AM EST ----- Pls let him know the brain MRI did not show any evidence of tumor, new stroke, or bleed. It showed age-related changes and changes seen in patients with blood pressure and cholesterol issues. Thanks

## 2017-04-13 DIAGNOSIS — J3081 Allergic rhinitis due to animal (cat) (dog) hair and dander: Secondary | ICD-10-CM | POA: Diagnosis not present

## 2017-04-13 DIAGNOSIS — J3089 Other allergic rhinitis: Secondary | ICD-10-CM | POA: Diagnosis not present

## 2017-04-13 DIAGNOSIS — J301 Allergic rhinitis due to pollen: Secondary | ICD-10-CM | POA: Diagnosis not present

## 2017-04-14 ENCOUNTER — Telehealth: Payer: Self-pay | Admitting: Family Medicine

## 2017-04-14 MED ORDER — IRBESARTAN 300 MG PO TABS: 300.0000 mg | ORAL_TABLET | Freq: Every day | ORAL | 1 refills | Status: DC

## 2017-04-14 NOTE — Telephone Encounter (Signed)
Copied from [place] [phone]. Topic: Quick Communication - See Telephone Encounter >> Apr 14, 2017  9:59 AM Boyd Kerbs wrote: CRM for notification. See Telephone encounter for:   [facility] [address] [facility] sent 2 faxes for  Irbesartan 300 mg and have not received any refill back.   Patient is asking for a 90 day supply . He has 2 pills left.   [facility] [place] [address] [place] [address] Phone: [phone] Fax: [phone]    04/14/17.

## 2017-04-15 ENCOUNTER — Telehealth: Payer: Self-pay | Admitting: Pulmonary Disease

## 2017-04-15 ENCOUNTER — Telehealth: Payer: Self-pay | Admitting: Family Medicine

## 2017-04-15 DIAGNOSIS — G4733 Obstructive sleep apnea (adult) (pediatric): Secondary | ICD-10-CM

## 2017-04-15 NOTE — Telephone Encounter (Signed)
See note

## 2017-04-15 NOTE — Telephone Encounter (Signed)
Copied from [place] [phone]. Topic: Quick Communication - Rx Refill/Question >> Apr 15, 2017  3:49 PM Oliver Pila B wrote: Medication: hydrochlorothiazide (HYDRODIURIL) 25 MG tablet [[id number]] DISCONTINUED Has the patient contacted their pharmacy? Yes.   (Agent: If no, request that the patient contact the pharmacy for the refill.) Preferred Pharmacy (with phone number [facility] street [facility] [place] Agent: Please be advised that RX refills may take up to 3 business days. We ask that you follow-up with your pharmacy.  Pharmacist called waiting for the Rx to be filled

## 2017-04-15 NOTE — Telephone Encounter (Signed)
Left message for Joshua [name] to call back.

## 2017-04-16 NOTE — Telephone Encounter (Signed)
lmtcb x2 for [employer]

## 2017-04-19 ENCOUNTER — Telehealth: Payer: Self-pay | Admitting: Family Medicine

## 2017-04-19 NOTE — Telephone Encounter (Signed)
Spoke with patient earlier. Sent message to Dr. Yong Channel to advise

## 2017-04-19 NOTE — Telephone Encounter (Signed)
Copied from [place]. Topic: Quick Communication - Rx Refill/Question >> Apr 19, 2017  1:29 PM Corie Chiquito, [place] wrote: Medication: Hydrochlorot   Has the patient contacted their pharmacy? Yes  Patient has been in contact with his pharmacy but he still hasn't received his medication. Was told to contact his doctors office. Stated this is his third  time call about this medication. Stated that he is out of the medication   Preferred Pharmacy (with phone number or street name): [facility] [place] [phone]   Agent: Please be advised that RX refills may take up to 3 business days. We ask that you follow-up with your pharmacy.

## 2017-04-19 NOTE — Telephone Encounter (Signed)
Per OV note dated 01/08/17:  Low today with orthostatic hypotension -this is likely contributing to his dizziness.  Stop HCTZ.  Patient and wife will continue to check over the next few days.  They will contact office if persistently lower than 100/60 or if persistently elevated greater than 140/90.   Called patient who states he never stopped taking this. He states he has been taking this all along.   OK to refill?

## 2017-04-19 NOTE — Telephone Encounter (Signed)
Please advise 

## 2017-04-19 NOTE — Telephone Encounter (Signed)
im ok with refill since he has been taking the medicine this whole time

## 2017-04-19 NOTE — Telephone Encounter (Signed)
Pt  Requesting a  Refill  Of  hctz   Not   On  Active  Med  List    Pharmacist   At  [facility]    [address]  Stated    That    Pt Had  Been on it  In  Past     Because  His  Wife brought in an old  Bottle  Of  It  From a  [facility]  That  Had  [employer] .  I called  [facility]   [place] who had  Already  Spoke  With  Pt

## 2017-04-19 NOTE — Telephone Encounter (Signed)
Please be advised.  °

## 2017-04-19 NOTE — Telephone Encounter (Signed)
Copied from [place]. Topic: General - Other >> Apr 19, 2017 10:29 AM Lolita Rieger, RMA wrote: Reason for CRM: Pt was returning the call pt stated that he did not have and vms and I informed in that in the notes that it states that the office had tried to reach him  Please contact pt [phone] can leave vm on this

## 2017-04-20 ENCOUNTER — Encounter: Payer: Self-pay | Admitting: Family Medicine

## 2017-04-20 ENCOUNTER — Ambulatory Visit: Payer: Medicare Other | Admitting: Neurology

## 2017-04-20 ENCOUNTER — Encounter: Payer: Self-pay | Admitting: Neurology

## 2017-04-20 NOTE — Telephone Encounter (Signed)
Called and spoke with Jonni Sanger from [facility] verifying the order for pt's settings that needs to be done.  Jonni Sanger clarified with me the settings.  Order sent to [facility].  Nothing further needed at this current time.

## 2017-04-21 ENCOUNTER — Other Ambulatory Visit: Payer: Self-pay

## 2017-04-21 MED ORDER — HYDROCHLOROTHIAZIDE 25 MG PO TABS: 25.0000 mg | ORAL_TABLET | Freq: Every day | ORAL | 3 refills | Status: DC

## 2017-04-22 ENCOUNTER — Encounter: Payer: Self-pay | Admitting: Neurology

## 2017-04-22 DIAGNOSIS — G609 Hereditary and idiopathic neuropathy, unspecified: Secondary | ICD-10-CM

## 2017-04-22 NOTE — Telephone Encounter (Signed)
Medication refill request was sent to pharmacy yesterday

## 2017-04-23 ENCOUNTER — Telehealth: Payer: Self-pay | Admitting: Neurology

## 2017-04-23 MED ORDER — GABAPENTIN 100 MG PO CAPS: ORAL_CAPSULE | ORAL | 11 refills | Status: DC

## 2017-04-23 NOTE — Telephone Encounter (Signed)
Patient called regarding his Gabapentin which he switched from 600 to 700, so he has run out sooner. He will be out of the medication tomorrow.  [facility] [address]. Thanks

## 2017-04-26 NOTE — Telephone Encounter (Signed)
Rx was sent on 04/23/17.

## 2017-04-27 DIAGNOSIS — J301 Allergic rhinitis due to pollen: Secondary | ICD-10-CM | POA: Diagnosis not present

## 2017-04-27 DIAGNOSIS — J3089 Other allergic rhinitis: Secondary | ICD-10-CM | POA: Diagnosis not present

## 2017-04-27 DIAGNOSIS — J3081 Allergic rhinitis due to animal (cat) (dog) hair and dander: Secondary | ICD-10-CM | POA: Diagnosis not present

## 2017-05-04 ENCOUNTER — Encounter: Payer: Self-pay | Admitting: Family Medicine

## 2017-05-04 ENCOUNTER — Ambulatory Visit (INDEPENDENT_AMBULATORY_CARE_PROVIDER_SITE_OTHER): Payer: PPO | Admitting: Family Medicine

## 2017-05-04 VITALS — BP 94/60 | HR 79 | Temp 97.6°F | Ht 67.0 in | Wt 215.2 lb

## 2017-05-04 DIAGNOSIS — B9689 Other specified bacterial agents as the cause of diseases classified elsewhere: Secondary | ICD-10-CM

## 2017-05-04 DIAGNOSIS — J329 Chronic sinusitis, unspecified: Secondary | ICD-10-CM

## 2017-05-04 DIAGNOSIS — J4540 Moderate persistent asthma, uncomplicated: Secondary | ICD-10-CM

## 2017-05-04 DIAGNOSIS — N183 Chronic kidney disease, stage 3 unspecified: Secondary | ICD-10-CM

## 2017-05-04 MED ORDER — AMOXICILLIN-POT CLAVULANATE 875-125 MG PO TABS: 1.0000 | ORAL_TABLET | Freq: Two times a day (BID) | ORAL | 0 refills | Status: AC

## 2017-05-04 NOTE — Progress Notes (Signed)
Subjective:  Joshua [name] is a 77 y.o. year old very pleasant male patient who presents for/with See problem oriented charting ROS- has had chills and subjective fever. No body aches. Has had nasal congestion. No chest pain or shortness of breath. Some cough.    Past Medical History-  Patient Active Problem List   Diagnosis Date Noted  . Coronary atherosclerosis 10/12/2006    Priority: High  . Irritable bowel syndrome 10/12/2006    Priority: High  . CKD (chronic kidney disease), stage III ([place]) 10/23/2013    Priority: Medium  . Neuropathy ([place]) 08/21/2013    Priority: Medium  . History of prostate cancer. Gleason 6 in 2012 04/01/2010    Priority: Medium  . Essential hypertension 02/21/2007    Priority: Medium  . Hyperlipidemia 10/12/2006    Priority: Medium  . DEPRESSION 10/12/2006    Priority: Medium  . Asthma 10/12/2006    Priority: Medium  . Obesity, Class I, BMI 30-34.9 10/03/2015    Priority: Low  . Allergic rhinitis 05/31/2014    Priority: Low  . Fatigue 11/09/2013    Priority: Low  . Dizziness 08/23/2013    Priority: Low  . RLS (restless legs syndrome) 01/03/2013    Priority: Low  . Low back pain radiating to left leg 10/09/2011    Priority: Low  . OSTEOARTHRITIS, LOWER LEG, LEFT 10/04/2009    Priority: Low  . TOBACCO ABUSE, HX OF 12/26/2008    Priority: Low  . ANEMIA, IRON DEFICIENCY 11/06/2008    Priority: Low  . 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 OSA (obstructive sleep apnea) 11/02/2006    Priority: Low  . GERD 10/12/2006    Priority: Low  . Pneumonia   . Pancreatitis   . Obesity   . IBS (irritable bowel syndrome)   . Hypertension   . History of gout   . GERD (gastroesophageal reflux disease)   . DJD (degenerative joint disease)   . Diverticulosis   . Depression   . Coronary artery disease involving native coronary artery of native heart without angina pectoris   . Arthritis   . Anemia   . Acute medial meniscal tear    . Neck pain 02/08/2017  . Venous insufficiency 12/29/2016  . Ganglion cyst of tendon sheath of right hand 12/29/2016  . Avulsion fracture of metatarsal bone of right foot 11/25/2016  . Gout 11/20/2016  . Dyslipidemia 09/23/2016  . Hereditary and idiopathic peripheral neuropathy 09/25/2014  . Prostate cancer ([place]) 04/23/2010  . Bladder cancer ([place]) 04/23/2010  . History of blood transfusion 03/24/1999    Medications- reviewed and updated Current Outpatient Medications  Medication Sig Dispense Refill  . acetaminophen (TYLENOL) 500 MG tablet Take 1,000 mg by mouth every 6 (six) hours as needed for headache (pain). Reported on 07/02/2015    . albuterol (PROAIR HFA) 108 (90 BASE) MCG/ACT inhaler Inhale 1-2 puffs into the lungs every 6 (six) hours as needed for wheezing or shortness of breath. 18 g 3  . aspirin EC 81 MG tablet Take 81 mg by mouth daily.    . Cyanocobalamin (VITAMIN B 12 PO) Take 800 mg by mouth daily.    [name] desloratadine (CLARINEX) 5 MG tablet Take 5 mg by mouth at bedtime.     . diazepam (VALIUM) 5 MG tablet Take 1 tablet (5 mg total) by mouth at bedtime as needed. 30 tablet 2  . Evolocumab (REPATHA SURECLICK) 831 MG/ML SOAJ Inject 140 mg into the skin  every 14 (fourteen) days. 2 pen 11  . FLOVENT HFA 110 MCG/ACT inhaler Inhale 2 puffs into the lungs 2 (two) times daily. Reported on 03/22/2015  5  . fluticasone (FLONASE) 50 MCG/ACT nasal spray Place 1 spray into both nostrils 2 (two) times daily.     . folic acid (FOLVITE) 710 MCG tablet Take 400 mcg by mouth daily.    [name] gabapentin (NEURONTIN) 100 MG capsule Take 1 cap in AM, 1 cap at noon, and 5 caps in PM 270 capsule 11  . hydrochlorothiazide (HYDRODIURIL) 25 MG tablet Take 1 tablet (25 mg total) by mouth daily. 90 tablet 3  . irbesartan (AVAPRO) 300 MG tablet Take 1 tablet (300 mg total) by mouth daily. 90 tablet 1  . ketoconazole (NIZORAL) 2 % cream Apply topically 2 (two) times daily.    . polyethylene glycol powder  (GLYCOLAX/MIRALAX) powder Take 17 g by mouth daily. 255 g 0  . Probiotic Product (ALIGN) 4 MG CAPS Take 4 mg by mouth daily.     . simvastatin (ZOCOR) 20 MG tablet Take 1 tablet (20 mg total) by mouth at bedtime. 90 tablet 3  . amoxicillin-clavulanate (AUGMENTIN) 875-125 MG tablet Take 1 tablet by mouth 2 (two) times daily for 7 days. 14 tablet 0   No current facility-administered medications for this visit.     Objective: BP 94/60 (BP Location: Left Arm, Patient Position: Sitting, Cuff Size: Large)   Pulse 79   Temp 97.6 F (36.4 C) (Oral)   Ht 5\' 7"  (1.702 m)   Wt 215 lb 3.2 oz (97.6 kg)   SpO2 96%   BMI 33.71 kg/m  Gen: NAD, appears fatigued Some yellow/clear discharge in nose. Pharynx with drainage- slightly large uvula without erythema.. Hoarse.  CV: RRR no murmurs rubs or gallops Lungs: CTAB no crackles, wheeze, rhonchi Abdomen: soft/nontender/nondistended/normal bowel sounds.  Ext: no edema Skin: warm, dry  Assessment/Plan:  Bacterial sinusitis  S: Sick for about a week. Started with sore throat and cough. Started out blowing out clear discharge. Thought it was getting worse and then last 2 days symptoms worsened again. Still blowing out clear discharge. Mild shortness of breath. Some coughing and since then some mild upper chest pain. No wheezing. Still taking qvar. hasnt felt needs albuterol. subjetive fever. Also having some chills. Did get flu shot this year.   A/P: new acute problem in high risk patient with age/CAD/asthma (no signs of flare)/CKD III (no adjustment in augmentin with GFR above 30) From avs "Augmentin for bacterial sinus infection (continued drainage at 7 days and getting worse after improving).   If you have worsening shortness of breath or fever above 100.5 please see [place] back. Please let us know if you have wheeze as well- held off on prednisone for now- asthma does not appear flared up right now. "  Future Appointments  Date Time Provider [place]  07/06/2017 10:15 AM [name], MD [facility]  07/20/2017  8:30 AM Cameron Sprang, MD [facility] None    Meds ordered this encounter  Medications  . amoxicillin-clavulanate (AUGMENTIN) 875-125 MG tablet    Sig: Take 1 tablet by mouth 2 (two) times daily for 7 days.    Dispense:  14 tablet    Refill:  0   Return precautions advised. PRN for this acute condition [name], MD

## 2017-05-04 NOTE — Patient Instructions (Addendum)
Augmentin for bacterial sinus infection (continued drainage at 7 days and getting worse after improving).   If you have worsening shortness of breath or fever above 100.5 please see [place] back. Please let us know if you have wheeze as well- held off on prednisone for now- asthma does not appear flared up right now.

## 2017-05-11 DIAGNOSIS — J3081 Allergic rhinitis due to animal (cat) (dog) hair and dander: Secondary | ICD-10-CM | POA: Diagnosis not present

## 2017-05-11 DIAGNOSIS — J3089 Other allergic rhinitis: Secondary | ICD-10-CM | POA: Diagnosis not present

## 2017-05-11 DIAGNOSIS — J301 Allergic rhinitis due to pollen: Secondary | ICD-10-CM | POA: Diagnosis not present

## 2017-05-12 ENCOUNTER — Encounter: Payer: Self-pay | Admitting: Podiatry

## 2017-05-12 ENCOUNTER — Other Ambulatory Visit: Payer: Self-pay | Admitting: Podiatry

## 2017-05-12 ENCOUNTER — Ambulatory Visit (INDEPENDENT_AMBULATORY_CARE_PROVIDER_SITE_OTHER): Payer: Medicare Other

## 2017-05-12 ENCOUNTER — Ambulatory Visit: Payer: Medicare Other | Admitting: Podiatry

## 2017-05-12 DIAGNOSIS — L600 Ingrowing nail: Secondary | ICD-10-CM | POA: Diagnosis not present

## 2017-05-12 DIAGNOSIS — M779 Enthesopathy, unspecified: Secondary | ICD-10-CM

## 2017-05-12 DIAGNOSIS — I2510 Atherosclerotic heart disease of native coronary artery without angina pectoris: Secondary | ICD-10-CM | POA: Diagnosis not present

## 2017-05-12 DIAGNOSIS — M79674 Pain in right toe(s): Secondary | ICD-10-CM | POA: Diagnosis not present

## 2017-05-12 NOTE — Patient Instructions (Signed)

## 2017-05-13 ENCOUNTER — Ambulatory Visit: Payer: Medicare Other | Admitting: Podiatry

## 2017-05-13 NOTE — Progress Notes (Signed)
Subjective:   Patient ID: Joshua [name], male   DOB: 77 y.o.   MRN: [id number]   HPI Patient presents with nail disease right big toe and also trauma to the right big toe with concerns about the underlying bone.  States the nail is been very tender making it hard to wear shoe gear and is tried to trim it and soak it himself and is not noted any drainage and the hallux itself has been irritated and he is concerned about bone structure.  Patient was noted to have no smoking at the current time and likes to be active   Review of Systems  All other systems reviewed and are negative.       Objective:  Physical Exam  Constitutional: He appears well-developed and well-nourished.  Cardiovascular: Intact distal pulses.  Pulmonary/Chest: Effort normal.  Musculoskeletal: Normal range of motion.  Neurological: He is alert.  Skin: Skin is warm.  Nursing note and vitals reviewed.   Neurovascular status was found to be intact muscle strength was adequate range of motion within normal limits with mild edema in the hallux secondary to trauma of the low-grade nature with an incurvated lateral border that is inflamed and painful in the distal corner with damage to the nailbed itself.  Patient has good digital perfusion and is well oriented x3     Assessment:  Ingrown toenail deformity right hallux lateral border traumatized right hallux with possible structural issues     Plan:  H&P condition reviewed and we will focus on the abnormal nail at the current time.  First reviewed x-rays with patient and I then infiltrated the right hallux 60 mg Xylocaine Marcaine mixture remove the lateral border exposed matrix and applied phenol 3 applications 30 seconds followed by alcohol lavage and sterile dressing before doing the procedures I did discuss risk and patient signed consent form and I advised this patient on soaks and to leave the dressing on for 1 day and take it off or earlier if any throbbing to the  toe should occur.  Patient is advised to call with any questions or concerns

## 2017-05-25 DIAGNOSIS — G4733 Obstructive sleep apnea (adult) (pediatric): Secondary | ICD-10-CM | POA: Diagnosis not present

## 2017-05-27 ENCOUNTER — Ambulatory Visit: Payer: PPO

## 2017-05-27 ENCOUNTER — Ambulatory Visit (INDEPENDENT_AMBULATORY_CARE_PROVIDER_SITE_OTHER): Payer: PPO | Admitting: Sports Medicine

## 2017-05-27 ENCOUNTER — Encounter: Payer: Self-pay | Admitting: Sports Medicine

## 2017-05-27 VITALS — BP 140/90 | HR 82 | Ht 67.0 in | Wt 219.4 lb

## 2017-05-27 DIAGNOSIS — Z8551 Personal history of malignant neoplasm of bladder: Secondary | ICD-10-CM | POA: Diagnosis not present

## 2017-05-27 DIAGNOSIS — J3089 Other allergic rhinitis: Secondary | ICD-10-CM | POA: Diagnosis not present

## 2017-05-27 DIAGNOSIS — M47816 Spondylosis without myelopathy or radiculopathy, lumbar region: Secondary | ICD-10-CM | POA: Diagnosis not present

## 2017-05-27 DIAGNOSIS — N183 Chronic kidney disease, stage 3 unspecified: Secondary | ICD-10-CM

## 2017-05-27 DIAGNOSIS — M79641 Pain in right hand: Secondary | ICD-10-CM

## 2017-05-27 DIAGNOSIS — J3081 Allergic rhinitis due to animal (cat) (dog) hair and dander: Secondary | ICD-10-CM | POA: Diagnosis not present

## 2017-05-27 DIAGNOSIS — Z8739 Personal history of other diseases of the musculoskeletal system and connective tissue: Secondary | ICD-10-CM | POA: Diagnosis not present

## 2017-05-27 DIAGNOSIS — Z8546 Personal history of malignant neoplasm of prostate: Secondary | ICD-10-CM

## 2017-05-27 DIAGNOSIS — J301 Allergic rhinitis due to pollen: Secondary | ICD-10-CM | POA: Diagnosis not present

## 2017-05-27 MED ORDER — DIAZEPAM 5 MG PO TABS: 5.0000 mg | ORAL_TABLET | Freq: Every evening | ORAL | 1 refills | Status: DC | PRN

## 2017-05-27 MED ORDER — METHYLPREDNISOLONE 4 MG PO TBPK: ORAL_TABLET | ORAL | 0 refills | Status: DC

## 2017-05-27 NOTE — Progress Notes (Signed)
Joshua [name], [place] at [place]  Joshua [name] - 77 y.o. male MRN [id number]  Date of birth: 1941-03-07  Visit Date: 05/27/2017  PCP: [name], MD   Referred by: [name], MD   Scribe for today's visit: [name], CMA     SUBJECTIVE:  Joshua [name] is here for pain in thoracic and lumbar spine .  His thoracic and lumbar pain symptoms INITIALLY: Began a week prior to presentation after playing pickle ball. He felt pain on the R side of his back. A few days later he lifted a heavy bag a cat liter and felt extreme pain in his lower back. He also notes that he did planks on Monday. Push ups have flared up back pain in the past. Described as severe aching, radiating to L leg and gluteal region Worsened with movement, sit-to-stand, walking Improved with rest Additional associated symptoms include:  He denies groin pain and neck pain.     At this time symptoms are improving compared to onset, pain is a little less severe and doesn't extend as far.  He has been taking Diazepam at bedtime to help with sleep. Rx was originally for back pain after spinal surgery. He has tried taking Tylenol with some relief. He has been using heat with some relief.    ROS Denies night time disturbances. Denies fevers, chills, or night sweats. Denies unexplained weight loss. Reports personal history of cancer. Denies changes in bowel or bladder habits. Denies recent unreported falls. Denies new or worsening dyspnea or wheezing. Denies headaches or dizziness.  Reports numbness, tingling or weakness  In the extremities.  Denies dizziness or presyncopal episodes Denies lower extremity edema    HISTORY & PERTINENT PRIOR DATA:  Prior History reviewed and updated per electronic medical record.  Significant history, findings, studies and interim changes include:  reports that he quit smoking about 24 years ago.  His smoking use included cigarettes. He has a 35.00 pack-year smoking history. he has never used smokeless tobacco. No results for input(s): HGBA1C, LABURIC, CREATINE in the last 8760 hours. No specialty comments available. No problems updated.  OBJECTIVE:  VS:  HT:5\' 7"  (170.2 cm)   WT:219 lb 6.4 oz (99.5 kg)  BMI:34.35    BP:140/90  HR:82bpm  TEMP: ( )  RESP:98 %   PHYSICAL EXAM: Constitutional: WDWN, Non-toxic appearing. Psychiatric: Alert & appropriately interactive.  Not depressed or anxious appearing. Respiratory: No increased work of breathing.  Trachea Midline Eyes: Pupils are equal.  EOM intact without nystagmus.  No scleral icterus  NEUROVASCULAR exam: No clubbing or cyanosis appreciated No significant venous stasis changes Capillary Refill: normal, less than 2 seconds   Low back: Overall well aligned no significant scoliosis.  He does have some paraspinal muscle spasms as well as pain over the left greater than right SI joint.  Positive ASIS compression test.  Negative straight leg raise bilaterally.  He does have markedly tight hip flexors.  Hip abduction strength is diminished bilaterally.  Right hand: Small amount of pain over the radial aspect of the hand with intact ligamentous structures of the [facility] and IP joints of the thumb.  Minimal pain over the DRUJ.  Mild pain over the [facility] joint.  No scaphoid pain.  ASSESSMENT & PLAN:   1. CKD (chronic kidney disease), stage III ([place])   2. History of prostate cancer   3. History of bladder cancer   4.  History of gout   5. Lumbar spondylosis   6. Right hand pain     Orders & Meds:  Orders Placed This Encounter  Procedures  . XR Lumbar Spine 2-3 Views    Meds ordered this encounter  Medications  . diazepam (VALIUM) 5 MG tablet    Sig: Take 1 tablet (5 mg total) by mouth at bedtime as needed.    Dispense:  30 tablet    Refill:  1  . methylPREDNISolone (MEDROL DOSEPAK) 4 MG TBPK tablet    Sig: Take by mouth as  directed. Take 6 tablets on the first day prescribed then as directed.    Dispense:  21 tablet    Refill:  0     PLAN:  Symptoms are consistent with mechanical low back pain but given history we will plan to obtain plain film x-rays.  Suspect he is going to have significant degree of underlying lumbar spondylosis.  Core stabilization exercises discussed and recommend slow return to activities as tolerated.  We will plan to have him begin on steroids for anti-inflammatory effect this is not a candidate for NSAIDs.  Valium for muscle relaxer nightly.  Discussed he should not combine this with other sedatives but he does report that this is been the most effective treatment that he has had.  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  Should improve with steroid Dosepak.  If any lack of improvement further diagnostic evaluation can be pursued.  No problem-specific Assessment & Plan notes found for this encounter.   Follow-up: Return in about 4 weeks (around 06/24/2017).   Pertinent documentation may be included in additional procedure notes, imaging studies, problem based documentation and patient instructions. Please see these sections of the encounter for additional information regarding this visit. CMA/ATC served as Education administrator during this visit. History, Physical, and Plan performed by medical provider. Documentation and orders reviewed and attested to.      Gerda Diss, [place] [facility] Physician

## 2017-05-31 NOTE — Procedures (Signed)
X-Rays obtained at [place] Interpreted by myself Gerda Diss, DO) during office visit.  Results were reviewed with the patient at the time of the visit.   3 VIEW X-RAY of: Lumbar spine  FINDINGS:   Small amount of curvature with significant osteophytic spurring at the lower lumbar segments.  He does have a small amount of sclerosis of the left SI joint but there is no evidence of erosions.  Aortic atherosclerosis  Grade 1 anterior listhesis of L4 on L5 with associated degenerative changes within the facets.  No evidence of acute fracture or dislocation IMPRESSION:  1. Lumbar spondylosis 2. SI joint sclerosis 3. Abdominal aortic atherosclerosis, known and monitored by cardiology/PCP.

## 2017-06-01 ENCOUNTER — Encounter: Payer: Self-pay | Admitting: Cardiology

## 2017-06-02 ENCOUNTER — Encounter: Payer: Self-pay | Admitting: Sports Medicine

## 2017-06-07 ENCOUNTER — Encounter: Payer: Self-pay | Admitting: Sports Medicine

## 2017-06-07 ENCOUNTER — Ambulatory Visit (INDEPENDENT_AMBULATORY_CARE_PROVIDER_SITE_OTHER): Payer: PPO | Admitting: Sports Medicine

## 2017-06-07 DIAGNOSIS — M1712 Unilateral primary osteoarthritis, left knee: Secondary | ICD-10-CM | POA: Diagnosis not present

## 2017-06-07 DIAGNOSIS — J3089 Other allergic rhinitis: Secondary | ICD-10-CM | POA: Diagnosis not present

## 2017-06-07 DIAGNOSIS — M545 Low back pain, unspecified: Secondary | ICD-10-CM

## 2017-06-07 DIAGNOSIS — J3081 Allergic rhinitis due to animal (cat) (dog) hair and dander: Secondary | ICD-10-CM | POA: Diagnosis not present

## 2017-06-07 DIAGNOSIS — M25562 Pain in left knee: Secondary | ICD-10-CM | POA: Diagnosis not present

## 2017-06-07 DIAGNOSIS — J301 Allergic rhinitis due to pollen: Secondary | ICD-10-CM | POA: Diagnosis not present

## 2017-06-07 LAB — POCT URINALYSIS DIPSTICK
Bilirubin, UA: NEGATIVE
Blood, UA: NEGATIVE
Glucose, UA: NEGATIVE
Ketones, UA: NEGATIVE
Leukocytes, UA: NEGATIVE
Nitrite, UA: NEGATIVE
Protein, UA: NEGATIVE
Spec Grav, UA: 1.02 (ref 1.010–1.025)
Urobilinogen, UA: 0.2 E.U./dL
pH, UA: 6 (ref 5.0–8.0)

## 2017-06-07 NOTE — Progress Notes (Deleted)
   Joshua Bond. [name], [place] at [place]  TORREN MAFFEO - 77 y.o. male MRN [id number]  Date of birth: Nov 18, 1940  Visit Date:   PCP: [name], MD   Referred by: [name], MD  Please see additional documentation for HPI, review of systems.  HISTORY & PERTINENT PRIOR DATA:  Prior History reviewed and updated per electronic medical record.  Significant history, findings, studies and interim changes include:  reports that he quit smoking about 24 years ago. His smoking use included cigarettes. He has a 35.00 pack-year smoking history. he has never used smokeless tobacco. No results for input(s): HGBA1C, LABURIC, CREATINE in the last 8760 hours. No specialty comments available. No problems updated.  OBJECTIVE:  VS:  HT:    WT:   BMI:     BP:   HR: bpm  TEMP: ( )  RESP:    PHYSICAL EXAM: WDWN, Non-toxic appearing. Psychiatric: Alert & appropriately interactive.  Not depressed or anxious appearing. Respiratory: No increased work of breathing.  Trachea Midline Eyes: Pupils are equal.  EOM intact without nystagmus.  No scleral icterus  NEUROVASCULAR exam: No clubbing or cyanosis appreciated No significant venous stasis changes normal, less than 2 seconds  {EXTRIMITY EXAM:22582:p}  {details:5942}  {Right/Left/Bilateral:210160114} {Joints:15814}  {Alignment & Contours::"normal":1} Skin: No overlying erythema/ecchymosis Effusion: none   none Tenderness: No focal bony TTP   RANGE OF MOTION & STRENGTH  {Normal/Abnormal:304960160}   LIGAMENTOUS TESTING  {Normal/Abnormal:304960160}   SPECIALITY TESTING:  ***    ASSESSMENT & PLAN:  No diagnosis found. Orders & Meds: No orders of the defined types were placed in this encounter. No orders of the defined types were placed in this encounter.  PLAN:  ***    ***    No problem-specific Assessment & Plan notes found for this  encounter.  Follow-up: No Follow-up on file.

## 2017-06-07 NOTE — Progress Notes (Signed)
   Joshua [name], [place] at [place]  Joshua [name] - 77 y.o. male MRN [id number]  Date of birth: Jul 28, 1940  Visit Date:   PCP: [name], MD   Referred by: [name], MD  Please see additional documentation for HPI, review of systems.  HISTORY & PERTINENT PRIOR DATA:  Prior History reviewed and updated per electronic medical record.  Significant history, findings, studies and interim changes include:  reports that he quit smoking about 25 years ago. His smoking use included cigarettes. He has a 35.00 pack-year smoking history. He has never used smokeless tobacco. No results for input(s): HGBA1C, LABURIC, CREATINE in the last 8760 hours. No specialty comments available. No problems updated.  OBJECTIVE:  VS:  HT:    WT:   BMI:     BP:   HR: bpm  TEMP: ( )  RESP:    PHYSICAL EXAM: WDWN, Non-toxic appearing. Psychiatric: Alert & appropriately interactive.  Not depressed or anxious appearing. Respiratory: No increased work of breathing.  Trachea Midline Eyes: Pupils are equal.  EOM intact without nystagmus.  No scleral icterus  NEUROVASCULAR exam: No clubbing or cyanosis appreciated No significant venous stasis changes normal, less than 2 seconds Back is overall well aligned without significant muscle spasms at this time.  No CVA tenderness.  No suprapubic pain.  Negative straight leg raise.  He does have generalized osteoarthritic bossing of the left knee.  Ligamentously stable.   ASSESSMENT & PLAN:   1. Acute right-sided low back pain without sciatica   2. Acute pain of left knee   3. Primary osteoarthritis of left knee    Orders & Meds:  Orders Placed This Encounter  Procedures  . POCT urinalysis dipstick   No orders of the defined types were placed in this encounter.   PLAN: Exact mechanism of his pain is slightly unclear however it could very well represent a intermittent  kidney stone given the waxing waning nature of his pain.  Urinalysis today is negative for occult blood but if any persistent or worsening symptoms further diagnostic evaluation could be pursued with CT scan of the abdomen pelvis versus MRI of the lumbar spine.        Follow-up: Return in about 2 weeks (around 06/21/2017).

## 2017-06-07 NOTE — Progress Notes (Signed)
  Joshua [name] - 77 y.o. male MRN [id number]  Date of birth: [birth year]  Scribe for today's visit: [name], CMA     SUBJECTIVE:  Joshua [name] is here for Follow-up (lower back pain)  05/27/17: His thoracic and lumbar pain symptoms INITIALLY: Began a week prior to presentation after playing pickle ball. He felt pain on the R side of his back. A few days later he lifted a heavy bag a cat liter and felt extreme pain in his lower back. He also notes that he did planks on Monday. Push ups have flared up back pain in the past. Described as severe aching, radiating to L leg and gluteal region Worsened with movement, sit-to-stand, walking Improved with rest Additional associated symptoms include:  He denies groin pain and neck pain.  At this time symptoms are improving compared to onset, pain is a little less severe and doesn't extend as far.  He has been taking Diazepam at bedtime to help with sleep. Rx was originally for back pain after spinal surgery. He has tried taking Tylenol with some relief. He has been using heat with some relief.   06/07/17: Compared to the last office visit, his previously described symptoms show no change.  Current symptoms are moderate/severe & are radiating to RT side. He denies radiating pain into legs.  He tried a steroid dose pack and did get some relief while on the medication. He has tried taking Tylenol and Diazepam with some relief.   ROS Denies night time disturbances. Denies fevers, chills, or night sweats. Denies unexplained weight loss. Reports personal history of cancer. Denies changes in bowel or bladder habits. Denies recent unreported falls. Denies new or worsening dyspnea or wheezing. Denies headaches or dizziness.  Denies numbness, tingling or weakness  In the extremities.  Denies dizziness or presyncopal episodes Denies lower extremity edema      Please see additional documentation for Objective, Assessment and Plan sections.  Pertinent additional documentation may be included in corresponding procedure notes, imaging studies, problem based documentation and patient instructions. Please see these sections of the encounter for additional information regarding this visit.  CMA/ATC served as Education administrator during this visit. History, Physical, and Plan performed by medical provider. Documentation and orders reviewed and attested to.      Gerda Diss, [place] [facility] Physician

## 2017-06-18 ENCOUNTER — Telehealth: Payer: Self-pay | Admitting: Family Medicine

## 2017-06-18 NOTE — Telephone Encounter (Signed)
Left message for pt. To call back. Prilosec is not on pt.'s medication list. Is this a medication another provider ordered?

## 2017-06-18 NOTE — Telephone Encounter (Signed)
Copied from [place]. Topic: Quick Communication - Rx Refill/Question >> Jun 18, 2017  1:51 PM Synthia Innocent wrote: Medication: omeprazole (PRILOSEC) 20 MG capsule  Has the patient contacted their pharmacy? Yes.   (Agent: If no, request that the patient contact the pharmacy for the refill.) Preferred Pharmacy (with phone number or street name): [facility] on [address] Agent: Please be advised that RX refills may take up to 3 business days. We ask that you follow-up with your pharmacy.

## 2017-06-18 NOTE — Telephone Encounter (Signed)
Patient had stopped the Prilosec because he had heard it caused cancer. He was using zantac- but it just does not work as well. He prefers the Prilosec and would like to restart that. Patient is requesting a new Rx sent to his pharmacy. Told patient I would send in his request.  Prlosec 20 mg   LOV: 05/04/17  PCP: [place]: verified

## 2017-06-19 NOTE — Telephone Encounter (Signed)
Yes thanks may fill. Please make sure zantac is off list if its still on there.

## 2017-06-21 ENCOUNTER — Ambulatory Visit (INDEPENDENT_AMBULATORY_CARE_PROVIDER_SITE_OTHER): Payer: PPO | Admitting: Sports Medicine

## 2017-06-21 ENCOUNTER — Encounter: Payer: Self-pay | Admitting: Sports Medicine

## 2017-06-21 ENCOUNTER — Other Ambulatory Visit: Payer: Self-pay

## 2017-06-21 VITALS — BP 122/78 | HR 79 | Ht 67.0 in | Wt 215.3 lb

## 2017-06-21 DIAGNOSIS — Z8546 Personal history of malignant neoplasm of prostate: Secondary | ICD-10-CM

## 2017-06-21 DIAGNOSIS — M545 Low back pain, unspecified: Secondary | ICD-10-CM

## 2017-06-21 DIAGNOSIS — M25562 Pain in left knee: Secondary | ICD-10-CM | POA: Diagnosis not present

## 2017-06-21 DIAGNOSIS — R29898 Other symptoms and signs involving the musculoskeletal system: Secondary | ICD-10-CM | POA: Diagnosis not present

## 2017-06-21 DIAGNOSIS — M47816 Spondylosis without myelopathy or radiculopathy, lumbar region: Secondary | ICD-10-CM

## 2017-06-21 DIAGNOSIS — Z8551 Personal history of malignant neoplasm of bladder: Secondary | ICD-10-CM | POA: Diagnosis not present

## 2017-06-21 MED ORDER — OMEPRAZOLE 20 MG PO CPDR: 20.0000 mg | DELAYED_RELEASE_CAPSULE | Freq: Every day | ORAL | 3 refills | Status: DC

## 2017-06-21 NOTE — Patient Instructions (Signed)
We are ordering an MRI for you today.  The imaging office will be calling you to schedule your appointment after we obtain authorization from your insurance company.   Please be sure you have signed up for MyChart so that we can get your results to you.  We will be in touch with you as soon as we can.  Please know, it can take up to 3-4 business days for the radiologist and Dr. [name] to have time to review the results and determine the best appropriate action.  If there is something that appears to be surgical or needs a referral to other specialists we will let you know through MyChart or telephone.  Otherwise we will plan to schedule a follow up appointment with Dr. [name] once we have the results.   

## 2017-06-21 NOTE — Telephone Encounter (Signed)
Prescription sent to pharmacy as requested.

## 2017-06-21 NOTE — Progress Notes (Signed)
Joshua [name]. Joshua [name], [place] at [place]  [name] - 78 y.o. male MRN [id number]  Date of birth: [birth year]  Visit Date: 06/21/2017  PCP: [name], MD   Referred by: [name], MD  Scribe for today's visit: [name], CMA     SUBJECTIVE:  Joshua [name] is here for Follow-up (lower back pain)  05/27/17: His thoracic and lumbar pain symptoms INITIALLY: Began a week prior to presentation after playing pickle ball. He felt pain on the R side of his back. A few days later he lifted a heavy bag a cat liter and felt extreme pain in his lower back. He also notes that he did planks on Monday. Push ups have flared up back pain in the past. Described as severe aching, radiating to L leg and gluteal region Worsened with movement, sit-to-stand, walking Improved with rest Additional associated symptoms include:  He denies groin pain and neck pain.  At this time symptoms are improving compared to onset, pain is a little less severe and doesn't extend as far.  He has been taking Diazepam at bedtime to help with sleep. Rx was originally for back pain after spinal surgery. He has tried taking Tylenol with some relief. He has been using heat with some relief.   06/07/17: Compared to the last office visit, his previously described symptoms show no change.  Current symptoms are moderate/severe & are radiating to RT side. He denies radiating pain into legs.  He tried a steroid dose pack and did get some relief while on the medication. He has tried taking Tylenol and Diazepam with some relief.   06/21/17: Compared to the last office visit, his previously described symptoms are worsening, having left-sided lower back pain. He was standing and rotated at the hips Saturday and has had severe pain since then.  LBP is constant. He feels like pain is related to abnormal gait.  Current symptoms are moderate/severe & are  radiating to L gluteal region.  He has been taking Tylenol prn.   ROS Denies night time disturbances. Denies fevers, chills, or night sweats. Denies unexplained weight loss. Reports personal history of cancer. Denies changes in bowel or bladder habits. Denies recent unreported falls. Denies new or worsening dyspnea or wheezing. Reports headaches and dizziness.  Reports weakness in L leg.  Reports dizziness or presyncopal episodes Denies lower extremity edema    HISTORY & PERTINENT PRIOR DATA:  Prior History reviewed and updated per electronic medical record.  Significant/pertinent history, findings, studies include:  reports that he quit smoking about 25 years ago. His smoking use included cigarettes. He has a 35.00 pack-year smoking history. He has never used smokeless tobacco. No results for input(s): HGBA1C, LABURIC, CREATINE in the last 8760 hours. No specialty comments available. Problem  Weakness of Left Leg  History of Bladder Cancer   S/p Prostatectomy 2012. Also cautery in bladder. Penile pain since that time.      OBJECTIVE:  VS:  HT:5\' 7"  (170.2 cm)   WT:215 lb 4.8 oz (97.7 kg)  BMI:33.71    BP:122/78  HR:79bpm  TEMP: ( )  RESP:98 %   PHYSICAL EXAM: Constitutional: WDWN, Non-toxic appearing. Psychiatric: Alert & appropriately interactive.  Not depressed or anxious appearing. Respiratory: No increased work of breathing.  Trachea Midline Eyes: Pupils are equal.  EOM intact without nystagmus.  No scleral icterus  Vascular Exam: warm to touch no edema  lower extremity neuro  exam: normal sensation diminished DTR in the B LE with trace diffusly  MSK Exam: Negative straight leg raise pain with palpation of the popliteal space but no pain over the greater sciatic notch.  He has midline pain over L5 and S1 with trace pain over the paraspinal musculature and left SI joint.  He does have focal tenderness over the left gluteal musculature and lower pole L5.  3  out of 5 weakness with dorsiflexion on the left and 5 out of 5 on the right.   ASSESSMENT & PLAN:   1. Lumbar spondylosis   2. Acute right-sided low back pain without sciatica   3. Acute pain of left knee   4. History of prostate cancer   5. History of bladder cancer   6. Weakness of left leg     PLAN: Given the ongoing pain and now progressive weakness MRI of the lumbar spine is indicated.  He is not having any pain that keeps him awake at night so will defer repeat steroid by mouth and will await MRI results to discuss further management options.  Is not interested in surgery.  He does have a history of bladder and prostate cancer status post radiation.  This could represent a insufficiency fracture versus further complication from his prior malignancies but suspect this is more of a primary musculoskeletal issue.  Follow-up: Return for MRI results review.        Please see additional documentation for Objective, Assessment and Plan sections. Pertinent additional documentation may be included in corresponding procedure notes, imaging studies, problem based documentation and patient instructions. Please see these sections of the encounter for additional information regarding this visit.  CMA/ATC served as Education administrator during this visit. History, Physical, and Plan performed by medical provider. Documentation and orders reviewed and attested to.      Gerda Diss, [place] Sports [facility] Physician

## 2017-06-22 DIAGNOSIS — J301 Allergic rhinitis due to pollen: Secondary | ICD-10-CM | POA: Diagnosis not present

## 2017-06-22 DIAGNOSIS — J3089 Other allergic rhinitis: Secondary | ICD-10-CM | POA: Diagnosis not present

## 2017-06-22 DIAGNOSIS — J3081 Allergic rhinitis due to animal (cat) (dog) hair and dander: Secondary | ICD-10-CM | POA: Diagnosis not present

## 2017-06-24 ENCOUNTER — Ambulatory Visit: Payer: PPO | Admitting: Sports Medicine

## 2017-06-24 DIAGNOSIS — G4733 Obstructive sleep apnea (adult) (pediatric): Secondary | ICD-10-CM | POA: Diagnosis not present

## 2017-06-29 DIAGNOSIS — J301 Allergic rhinitis due to pollen: Secondary | ICD-10-CM | POA: Diagnosis not present

## 2017-06-29 DIAGNOSIS — J3081 Allergic rhinitis due to animal (cat) (dog) hair and dander: Secondary | ICD-10-CM | POA: Diagnosis not present

## 2017-06-29 DIAGNOSIS — J3089 Other allergic rhinitis: Secondary | ICD-10-CM | POA: Diagnosis not present

## 2017-07-01 ENCOUNTER — Ambulatory Visit
Admission: RE | Admit: 2017-07-01 | Discharge: 2017-07-01 | Disposition: A | Discharge status: Home / Self Care | Payer: PPO | Source: Ambulatory Visit | Attending: Sports Medicine | Admitting: Sports Medicine

## 2017-07-01 DIAGNOSIS — M48061 Spinal stenosis, lumbar region without neurogenic claudication: Secondary | ICD-10-CM | POA: Diagnosis not present

## 2017-07-01 DIAGNOSIS — M47816 Spondylosis without myelopathy or radiculopathy, lumbar region: Secondary | ICD-10-CM

## 2017-07-02 ENCOUNTER — Encounter: Payer: Self-pay | Admitting: Sports Medicine

## 2017-07-02 ENCOUNTER — Ambulatory Visit (INDEPENDENT_AMBULATORY_CARE_PROVIDER_SITE_OTHER): Payer: PPO | Admitting: Sports Medicine

## 2017-07-02 VITALS — BP 102/72 | HR 90 | Ht 67.0 in | Wt 217.6 lb

## 2017-07-02 DIAGNOSIS — J3089 Other allergic rhinitis: Secondary | ICD-10-CM | POA: Diagnosis not present

## 2017-07-02 DIAGNOSIS — M47816 Spondylosis without myelopathy or radiculopathy, lumbar region: Secondary | ICD-10-CM

## 2017-07-02 DIAGNOSIS — J301 Allergic rhinitis due to pollen: Secondary | ICD-10-CM | POA: Diagnosis not present

## 2017-07-02 DIAGNOSIS — J3081 Allergic rhinitis due to animal (cat) (dog) hair and dander: Secondary | ICD-10-CM | POA: Diagnosis not present

## 2017-07-02 NOTE — Progress Notes (Signed)
Joshua [name]. [name], [facility] [place]  Joshua [name] - 77 y.o. male MRN [id number]  Date of birth: 05-21-1940  Visit Date: 07/02/2017  PCP: [name], MD   Referred by: [name], MD  Scribe for today's visit: [name], CMA     SUBJECTIVE:  Joshua [name] is here for No chief complaint on file.  05/27/17: His thoracic and lumbar pain symptoms INITIALLY: Began a week prior to presentation after playing pickle ball. He felt pain on the R side of his back. A few days later he lifted a heavy bag a cat liter and felt extreme pain in his lower back. He also notes that he did planks on Monday. Push ups have flared up back pain in the past. Described as severe aching, radiating to L leg and gluteal region Worsened with movement, sit-to-stand, walking Improved with rest Additional associated symptoms include:  He denies groin pain and neck pain.  At this time symptoms are improving compared to onset, pain is a little less severe and doesn't extend as far.  He has been taking Diazepam at bedtime to help with sleep. Rx was originally for back pain after spinal surgery. He has tried taking Tylenol with some relief. He has been using heat with some relief.   06/07/17: Compared to the last office visit, his previously described symptoms show no change.  Current symptoms are moderate/severe & are radiating to RT side. He denies radiating pain into legs.  He tried a steroid dose pack and did get some relief while on the medication. He has tried taking Tylenol and Diazepam with some relief.   06/21/17: Compared to the last office visit, his previously described symptoms are worsening, having left-sided lower back pain. He was standing and rotated at the hips Saturday and has had severe pain since then.  LBP is constant. He feels like pain is related to abnormal gait.  Current symptoms are moderate/severe & are  radiating to L gluteal region.  He has been taking Tylenol prn.   07/02/2017: Compared to the last office visit, his previously described symptoms show no change Current symptoms are moderate & are radiating to L gluteal region He has been taking Tylenol prn with minimal relief.   ROS Denies night time disturbances. Denies fevers, chills, or night sweats. Denies unexplained weight loss. Reports personal history of cancer. Denies changes in bowel or bladder habits. Denies recent unreported falls. Denies new or worsening dyspnea or wheezing. Reports headaches or dizziness.  Reports numbness, tingling or weakness  In the extremities.  Reports dizziness or presyncopal episodes Denies lower extremity edema    HISTORY & PERTINENT PRIOR DATA:  Prior History reviewed and updated per electronic medical record.  Significant/pertinent history, findings, studies include:  reports that he quit smoking about 25 years ago. His smoking use included cigarettes. He has a 35.00 pack-year smoking history. He has never used smokeless tobacco. No results for input(s): HGBA1C, LABURIC, CREATINE in the last 8760 hours. No specialty comments available. No problems updated.  OBJECTIVE:  VS:  HT:5\' 7"  (170.2 cm)   WT:217 lb 9.6 oz (98.7 kg)  BMI:34.07    BP:102/72  HR:90bpm  TEMP: ( )  RESP:96 %   PHYSICAL EXAM: Constitutional: WDWN, Non-toxic appearing. Psychiatric: Alert & appropriately interactive.  Not depressed or anxious appearing. Respiratory: No increased work of breathing.  Trachea Midline Eyes: Pupils are equal.  EOM intact without nystagmus.  No scleral icterus  Vascular Exam: warm to touch no edema  lower extremity neuro exam: unremarkable  MSK Exam: Positive straight leg raise.  His heel toe walk is intact.   ASSESSMENT & PLAN:   1. Lumbar spondylosis     PLAN:>50% of this 25 minute visit spent in direct patient counseling and/or coordination of care.  Discussion was  focused on education regarding the in discussing the pathoetiology and anticipated clinical course of the above condition.    Referral for epidural steroid injection placed today.  We will plan to have him follow-up in 8 weeks and check on his progress at that time.  If any persistent symptoms can consider neurosurgical evaluation however he is not interested in surgery at this time.  Follow-up: Return in about 8 weeks (around 08/27/2017).      Please see additional documentation for Objective, Assessment and Plan sections. Pertinent additional documentation may be included in corresponding procedure notes, imaging studies, problem based documentation and patient instructions. Please see these sections of the encounter for additional information regarding this visit.  CMA/ATC served as Education administrator during this visit. History, Physical, and Plan performed by medical provider. Documentation and orders reviewed and attested to.      Gerda Diss, [facility] Physician

## 2017-07-05 ENCOUNTER — Ambulatory Visit: Payer: PPO | Admitting: Pulmonary Disease

## 2017-07-05 ENCOUNTER — Encounter: Payer: Self-pay | Admitting: Pulmonary Disease

## 2017-07-05 VITALS — BP 106/76 | HR 80 | Ht 66.0 in | Wt 218.6 lb

## 2017-07-05 DIAGNOSIS — G4719 Other hypersomnia: Secondary | ICD-10-CM

## 2017-07-05 DIAGNOSIS — G4733 Obstructive sleep apnea (adult) (pediatric): Secondary | ICD-10-CM | POA: Diagnosis not present

## 2017-07-05 NOTE — Progress Notes (Signed)
[facility]  Chief Complaint  Patient presents with  . Follow-up    Pt is sleeping well, when waking up in the morning pt is very tired and sleepy. He is not sure if it medication or pressure settings on cpap machine    Vital signs: BP 106/76 (BP Location: Left Arm, Cuff Size: Normal)   Pulse 80   Ht 5\' 6"  (1.676 m)   Wt 218 lb 9.6 oz (99.2 kg)   SpO2 96%   BMI 35.28 kg/m   History of Present Illness: Joshua [name] is a 77 y.o. male with obstructive sleep apnea.  He uses CPAP nightly.  Gets 6.5 hrs at night.  Naps 1 or 2 times during day.  Not using CPAP then.  No issues with mask fit.  Feels sleepy in morning.  Using clarinex for allergies; takes at night.  Started on gabapentin for neuropathy.  Decreased dose recently, and has improved sleepiness some.   Physical Exam:  General - pleasant Eyes - pupils reactive ENT - no sinus tenderness, no oral exudate, no LAN Cardiac - regular, no murmur Chest - no wheeze, rales Abd - soft, non tender Ext - no edema Skin - no rashes Neuro - normal strength Psych - normal mood   Assessment/Plan:  Obstructive sleep apnea. - he is compliant with CPAP and reports benefit - continue auto CPAP - he should be eligible for a new CPAP machine in late 2020  Daytime hypersomnolence. - advised him to use CPAP whenever he is asleep, including during naps - likely related to medications (gabapentin, clarinex) - he is followed by Dr. Delice [name] for neuropathy and Dr. Orvil [name] for allergies/asthma    Patient Instructions  Use your CPAP whenever you are asleep, including during naps  Follow up in 1 year    Joshua Mires, MD [place] 07/05/2017, 11:10 AM  Flow Sheet  Sleep tests: PSG 12/25/13 >> AHI 13 Auto CPAP 06/01/17 to 06/30/17 >> used on 30 of 30 nights with average 6 hrs 36 min.  Average AHI 1.4 with median CPAP 9 and 95 th percentile CPAP 11 cm H2O  Cardiac  tests: Echo 05/10/12 >> EF 60 to 65%, mild LVH  Past Medical History: He  has a past medical history of Acute medial meniscal tear, Anemia, Arthritis, Asthma, Bladder cancer ([place]) (04/2010), CAD (coronary artery disease), Depression, Diverticulosis, DJD (degenerative joint disease), GERD (gastroesophageal reflux disease), History of blood transfusion (2001), History of gout, Hyperlipidemia, Hypertension, IBS (irritable bowel syndrome), Obesity, OSA (obstructive sleep apnea), Pancreatitis (~ 1980), Pneumonia (1980's X 2), and Prostate cancer ([place]) (04/2010).  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  Family History: His family history includes Diabetes in his brother; Heart disease in his father and mother; Hyperlipidemia in his father and mother.  Social History: He  reports that he quit smoking about 25 years ago. His smoking use included cigarettes. He has a 35.00 pack-year smoking history. He has never used smokeless tobacco. He reports that he drinks about 2.4 oz of alcohol per week. He reports that he does not use drugs.  Medications: Allergies as of 07/05/2017      Reactions   Benadryl [diphenhydramine] Other (See Comments)   Pt  told not to take because of bypass surgery   Bromfed Nausea And Vomiting   Clarithromycin Other (See Comments)    gastritis   Codeine Other (See Comments)   Trouble breathing   Doxycycline Hyclate Nausea And Vomiting   Modafinil Other (See Comments)    anxiety-nervousness   Oxycodone-acetaminophen Itching   Promethazine Hcl Other (See Comments)    fainting   Quinolones    Cipro    Telithromycin Nausea And Vomiting   Hydrocodone-acetaminophen Rash   Sulfonamide Derivatives Rash      Medication List        Accurate as of 07/05/17 11:10 AM. Always use your most recent med list.          acetaminophen 500 MG tablet Commonly known as:  TYLENOL Take 1,000 mg by mouth every 6 (six) hours as needed for headache (pain). Reported on 07/02/2015   albuterol 108 (90 Base) MCG/ACT inhaler Commonly known as:  PROAIR HFA Inhale 1-2 puffs into the lungs every 6 (six) hours as needed for wheezing or shortness of breath.   ALIGN 4 MG Caps Take 4 mg by mouth daily.   aspirin EC 81 MG tablet Take 81 mg by mouth daily.   desloratadine 5 MG tablet Commonly known as:  CLARINEX Take 5 mg by mouth at bedtime.   diazepam 5 MG tablet Commonly known as:  VALIUM Take 1 tablet (5 mg total) by mouth at bedtime as needed.   Evolocumab 140 MG/ML Soaj Commonly known as:  REPATHA SURECLICK Inject 683 mg into the skin every 14 (fourteen) days.   FLOVENT HFA 110 MCG/ACT inhaler Generic drug:  fluticasone Inhale 2 puffs into the lungs 2 (two) times daily. Reported on 03/22/2015   fluticasone 50 MCG/ACT nasal spray Commonly known as:  FLONASE Place 1 spray into both nostrils 2 (two) times daily.   folic acid 419 MCG tablet Commonly known as:  FOLVITE Take 400 mcg by mouth daily.   gabapentin 100 MG capsule Commonly known as:  NEURONTIN Take 1 cap in AM, 1 cap at noon, and 5 caps in PM   hydrochlorothiazide 25 MG tablet Commonly known as:  HYDRODIURIL Take 1 tablet (25 mg total) by mouth daily.   irbesartan 300 MG tablet Commonly known as:  AVAPRO Take 1 tablet (300 mg total) by mouth daily.   ketoconazole 2 % cream Commonly known as:  NIZORAL Apply topically 2 (two) times daily.   NON FORMULARY   omeprazole 20 MG capsule Commonly known as:  PRILOSEC Take 1 capsule (20 mg total) by mouth daily.   polyethylene glycol powder powder Commonly known as:   GLYCOLAX/MIRALAX Take 17 g by mouth daily.   simvastatin 20 MG tablet Commonly known as:  ZOCOR Take 1 tablet (20 mg total) by mouth at bedtime.   VITAMIN B 12 PO Take 800 mg by mouth daily.

## 2017-07-05 NOTE — Patient Instructions (Signed)
Use your CPAP whenever you are asleep, including during naps  Follow up in 1 year

## 2017-07-06 ENCOUNTER — Ambulatory Visit: Payer: PPO | Admitting: Sports Medicine

## 2017-07-06 ENCOUNTER — Ambulatory Visit (INDEPENDENT_AMBULATORY_CARE_PROVIDER_SITE_OTHER): Payer: PPO | Admitting: Family Medicine

## 2017-07-06 ENCOUNTER — Encounter: Payer: Self-pay | Admitting: Family Medicine

## 2017-07-06 VITALS — BP 128/72 | HR 76 | Temp 98.4°F | Ht 66.0 in | Wt 219.8 lb

## 2017-07-06 DIAGNOSIS — N183 Chronic kidney disease, stage 3 unspecified: Secondary | ICD-10-CM

## 2017-07-06 DIAGNOSIS — I10 Essential (primary) hypertension: Secondary | ICD-10-CM

## 2017-07-06 DIAGNOSIS — E785 Hyperlipidemia, unspecified: Secondary | ICD-10-CM

## 2017-07-06 MED ORDER — ONDANSETRON 8 MG PO TBDP: 8.0000 mg | ORAL_TABLET | Freq: Three times a day (TID) | ORAL | 0 refills | Status: DC | PRN

## 2017-07-06 NOTE — Progress Notes (Signed)
Subjective:  Joshua [name] is a 77 y.o. year old very pleasant male patient who presents for/with See problem oriented charting ROS- dealing with back pain- getting epidural steroid injection soon. No shortness of breath. No exertional chest pain. Mild edema   Past Medical History-  Patient Active Problem List   Diagnosis Date Noted  . Coronary atherosclerosis 10/12/2006    Priority: High  . Irritable bowel syndrome 10/12/2006    Priority: High  . Gout 11/20/2016    Priority: Medium  . CKD (chronic kidney disease), stage III ([place]) 10/23/2013    Priority: Medium  . Neuropathy ([place]) 08/21/2013    Priority: Medium  . History of bladder cancer 04/01/2010    Priority: Medium  . Essential hypertension 02/21/2007    Priority: Medium  . Hyperlipidemia 10/12/2006    Priority: Medium  . DEPRESSION 10/12/2006    Priority: Medium  . Asthma 10/12/2006    Priority: Medium  . DJD (degenerative joint disease)     Priority: Low  . Arthritis     Priority: Low  . Neck pain 02/08/2017    Priority: Low  . Venous insufficiency 12/29/2016    Priority: Low  . Ganglion cyst of tendon sheath of right hand 12/29/2016    Priority: Low  . Avulsion fracture of metatarsal bone of right foot 11/25/2016    Priority: Low  . Obesity, Class I, BMI 30-34.9 10/03/2015    Priority: Low  . Allergic rhinitis 05/31/2014    Priority: Low  . Fatigue 11/09/2013    Priority: Low  . Dizziness 08/23/2013    Priority: Low  . RLS (restless legs syndrome) 01/03/2013    Priority: Low  . Low back pain radiating to left leg 10/09/2011    Priority: Low  . OSTEOARTHRITIS, LOWER LEG, LEFT 10/04/2009    Priority: Low  . TOBACCO ABUSE, HX OF 12/26/2008    Priority: Low  . ANEMIA, IRON DEFICIENCY 11/06/2008    Priority: Low  . 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 OSA (obstructive sleep apnea) 11/02/2006    Priority: Low  . GERD 10/12/2006    Priority: Low  . Weakness of left leg  06/21/2017    Medications- reviewed and updated Current Outpatient Medications  Medication Sig Dispense Refill  . acetaminophen (TYLENOL) 500 MG tablet Take 1,000 mg by mouth every 6 (six) hours as needed for headache (pain). Reported on 07/02/2015    . albuterol (PROAIR HFA) 108 (90 BASE) MCG/ACT inhaler Inhale 1-2 puffs into the lungs every 6 (six) hours as needed for wheezing or shortness of breath. 18 g 3  . aspirin EC 81 MG tablet Take 81 mg by mouth daily.    . Cyanocobalamin (VITAMIN B 12 PO) Take 800 mg by mouth daily.    [name] desloratadine (CLARINEX) 5 MG tablet Take 5 mg by mouth at bedtime.     . diazepam (VALIUM) 5 MG tablet Take 1 tablet (5 mg total) by mouth at bedtime as needed. 30 tablet 1  . Evolocumab (REPATHA SURECLICK) 062 MG/ML SOAJ Inject 140 mg into the skin every 14 (fourteen) days. 2 pen 11  . FLOVENT HFA 110 MCG/ACT inhaler Inhale 2 puffs into the lungs 2 (two) times daily. Reported on 03/22/2015  5  . fluticasone (FLONASE) 50 MCG/ACT nasal spray Place 1 spray into both nostrils 2 (two) times daily.     . folic acid (FOLVITE) 694 MCG tablet Take 400 mcg by mouth daily.    [name]  gabapentin (NEURONTIN) 100 MG capsule Take 1 cap in AM, 1 cap at noon, and 5 caps in PM 270 capsule 11  . hydrochlorothiazide (HYDRODIURIL) 25 MG tablet Take 1 tablet (25 mg total) by mouth daily. 90 tablet 3  . irbesartan (AVAPRO) 300 MG tablet Take 1 tablet (300 mg total) by mouth daily. 90 tablet 1  . ketoconazole (NIZORAL) 2 % cream Apply topically 2 (two) times daily.    . NON FORMULARY     . omeprazole (PRILOSEC) 20 MG capsule Take 1 capsule (20 mg total) by mouth daily. 30 capsule 3  . polyethylene glycol powder (GLYCOLAX/MIRALAX) powder Take 17 g by mouth daily. 255 g 0  . Probiotic Product (ALIGN) 4 MG CAPS Take 4 mg by mouth daily.     . simvastatin (ZOCOR) 20 MG tablet Take 1 tablet (20 mg total) by mouth at bedtime. 90 tablet 3   No current facility-administered medications for this  visit.     Objective: BP 128/72 (BP Location: Left Arm, Patient Position: Sitting, Cuff Size: Large)   Pulse 76   Temp 98.4 F (36.9 C) (Oral)   Ht 5\' 6"  (1.676 m)   Wt 219 lb 12.8 oz (99.7 kg)   SpO2 96%   BMI 35.48 kg/m  Gen: NAD, resting comfortably CV: RRR no murmurs rubs or gallops Lungs: CTAB no crackles, wheeze, rhonchi Abdomen: soft/nontender/nondistended/normal bowel sounds.  Ext: no edema Skin: warm, dry  Assessment/Plan:  Other notes 1. likes ondansetron for nausea with IBS at times. Will refill. Qt not prolonged on last ekg  Hyperlipidemia S: lipids have done well on simvastatin 20mg  and repatha with last LDL at 9! Last visit he was concerned about insurance coverage A/P: repatha did get covered- will continue current medicines  Essential hypertension S: controlled on irbesartan 300mg  and hctz 25mg  BP Readings from Last 3 Encounters:  07/06/17 128/72  07/05/17 106/76  07/02/17 102/72  A/P: We discussed blood pressure goal of <140/90. Continue current meds  CKD (chronic kidney disease), stage III ([facility]) S: GFR typically right around 60 and slightly below. Knows to avoid nsaids. bp and lipids controlled and no diabetes. Also not on ppi at present  A/P: we discussed doing labs- he would prefer to do at [facility]  Future Appointments  Date Time Provider [place]  07/20/2017  8:30 AM Cameron Sprang, MD [facility] None  07/26/2017 10:30 AM Magnus Sinning, MD [facility] None  08/05/2017 10:00 AM Williemae Area, RN [facility]  08/26/2017  9:20 AM Gerda Diss, DO [facility]   Return in about 3 months (around 10/05/2017) for physical, come fasting.  Meds ordered this encounter  Medications  . ondansetron (ZOFRAN ODT) 8 MG disintegrating tablet    Sig: Take 1 tablet (8 mg total) by mouth every 8 (eight) hours as needed for nausea or vomiting.    Dispense:  20 tablet    Refill:  0    Return precautions advised.  [name], MD

## 2017-07-06 NOTE — Assessment & Plan Note (Signed)
S: controlled on irbesartan 300mg  and hctz 25mg  BP Readings from Last 3 Encounters:  07/06/17 128/72  07/05/17 106/76  07/02/17 102/72  A/P: We discussed blood pressure goal of <140/90. Continue current meds

## 2017-07-06 NOTE — Assessment & Plan Note (Signed)
S: lipids have done well on simvastatin 20mg  and repatha with last LDL at 9! Last visit he was concerned about insurance coverage A/P: repatha did get covered- will continue current medicines

## 2017-07-06 NOTE — Patient Instructions (Addendum)
No changes today  Come fasting next visit

## 2017-07-06 NOTE — Assessment & Plan Note (Signed)
S: GFR typically right around 60 and slightly below. Knows to avoid nsaids. bp and lipids controlled and no diabetes. Also not on ppi at present  A/P: we discussed doing labs- he would prefer to do at [facility]

## 2017-07-07 DIAGNOSIS — J301 Allergic rhinitis due to pollen: Secondary | ICD-10-CM | POA: Diagnosis not present

## 2017-07-07 DIAGNOSIS — J3089 Other allergic rhinitis: Secondary | ICD-10-CM | POA: Diagnosis not present

## 2017-07-12 ENCOUNTER — Telehealth: Payer: Self-pay

## 2017-07-12 ENCOUNTER — Telehealth (INDEPENDENT_AMBULATORY_CARE_PROVIDER_SITE_OTHER): Payer: Self-pay | Admitting: Physical Medicine and Rehabilitation

## 2017-07-12 NOTE — Telephone Encounter (Signed)
Looks like this was supposed to go to you. He is scheduled to see Dr [name] on 05/06 referred by Dr Paulla Fore

## 2017-07-12 NOTE — Telephone Encounter (Signed)
Copied from [place] [id number]. Topic: Referral - Request >> Jul 12, 2017 12:05 PM Aurelio Brash B wrote: Reason for CRM: Pt is requesting a referral to orthopedics   Bil L4-5 & L5-S1  - Dr [name] can not get him in soon enough  (May 6)     -  possibly  [place]  >> Jul 12, 2017  1:05 PM Brayton El, [place] A wrote: Pt called in stated that he no longer needs this.  He moved up this appt.

## 2017-07-12 NOTE — Telephone Encounter (Signed)
Copied from [place] [id number]. Topic: Referral - Request >> Jul 12, 2017 12:05 PM Aurelio Brash B wrote: Reason for CRM: Pt is requesting a referral to orthopedics   Bil L4-5 & L5-S1  - Dr [name] can not get him in soon enough  (May 6)     -  possibly  [place]

## 2017-07-12 NOTE — Telephone Encounter (Signed)
Encounter complete as pt was able to get his appt w/ Dr. [name] moved up from Jul 26, 2017 to July 19, 2017.

## 2017-07-12 NOTE — Telephone Encounter (Signed)
Worked patient in for 4/29 and added to wait list.

## 2017-07-12 NOTE — Telephone Encounter (Signed)
Patient called asking if he could possibly be seen sooner than May 6th, he states he's in extreme pain, he can barely walk. He would like a call back as soon possible. CB # [phone]

## 2017-07-13 DIAGNOSIS — J301 Allergic rhinitis due to pollen: Secondary | ICD-10-CM | POA: Diagnosis not present

## 2017-07-13 DIAGNOSIS — J3081 Allergic rhinitis due to animal (cat) (dog) hair and dander: Secondary | ICD-10-CM | POA: Diagnosis not present

## 2017-07-13 DIAGNOSIS — J3089 Other allergic rhinitis: Secondary | ICD-10-CM | POA: Diagnosis not present

## 2017-07-19 ENCOUNTER — Ambulatory Visit (INDEPENDENT_AMBULATORY_CARE_PROVIDER_SITE_OTHER): Payer: PPO | Admitting: Physical Medicine and Rehabilitation

## 2017-07-19 ENCOUNTER — Encounter

## 2017-07-19 ENCOUNTER — Encounter (INDEPENDENT_AMBULATORY_CARE_PROVIDER_SITE_OTHER): Payer: Self-pay | Admitting: Physical Medicine and Rehabilitation

## 2017-07-19 ENCOUNTER — Ambulatory Visit (INDEPENDENT_AMBULATORY_CARE_PROVIDER_SITE_OTHER): Payer: Self-pay

## 2017-07-19 VITALS — BP 135/83

## 2017-07-19 DIAGNOSIS — M5416 Radiculopathy, lumbar region: Secondary | ICD-10-CM | POA: Diagnosis not present

## 2017-07-19 DIAGNOSIS — G609 Hereditary and idiopathic neuropathy, unspecified: Secondary | ICD-10-CM

## 2017-07-19 DIAGNOSIS — J3089 Other allergic rhinitis: Secondary | ICD-10-CM | POA: Diagnosis not present

## 2017-07-19 DIAGNOSIS — R29898 Other symptoms and signs involving the musculoskeletal system: Secondary | ICD-10-CM

## 2017-07-19 DIAGNOSIS — M47816 Spondylosis without myelopathy or radiculopathy, lumbar region: Secondary | ICD-10-CM

## 2017-07-19 DIAGNOSIS — J301 Allergic rhinitis due to pollen: Secondary | ICD-10-CM | POA: Diagnosis not present

## 2017-07-19 DIAGNOSIS — M48062 Spinal stenosis, lumbar region with neurogenic claudication: Secondary | ICD-10-CM

## 2017-07-19 DIAGNOSIS — J3081 Allergic rhinitis due to animal (cat) (dog) hair and dander: Secondary | ICD-10-CM | POA: Diagnosis not present

## 2017-07-19 MED ORDER — METHYLPREDNISOLONE ACETATE 80 MG/ML IJ SUSP: 80.0000 mg | Freq: Once | INTRAMUSCULAR | Status: DC

## 2017-07-19 MED ORDER — BETAMETHASONE SOD PHOS & ACET 6 (3-3) MG/ML IJ SUSP
12.0000 mg | Freq: Once | INTRAMUSCULAR | Status: AC
  Administered 2017-07-19: 12 mg

## 2017-07-19 NOTE — Progress Notes (Signed)
 .  Numeric Pain Rating Scale and Functional Assessment Average Pain 4   In the last MONTH (on 0-10 scale) has pain interfered with the following?  1. General activity like being  able to carry out your everyday physical activities such as walking, climbing stairs, carrying groceries, or moving a chair?  Rating(5)   +Driver, -BT, -Dye Allergies.

## 2017-07-20 ENCOUNTER — Encounter: Payer: Self-pay | Admitting: Neurology

## 2017-07-20 ENCOUNTER — Ambulatory Visit: Payer: PPO | Admitting: Neurology

## 2017-07-20 ENCOUNTER — Other Ambulatory Visit: Payer: Self-pay

## 2017-07-20 VITALS — BP 136/68 | HR 96 | Ht 66.0 in | Wt 218.0 lb

## 2017-07-20 DIAGNOSIS — G609 Hereditary and idiopathic neuropathy, unspecified: Secondary | ICD-10-CM | POA: Diagnosis not present

## 2017-07-20 MED ORDER — GABAPENTIN 100 MG PO CAPS: ORAL_CAPSULE | ORAL | 3 refills | Status: DC

## 2017-07-20 NOTE — Patient Instructions (Signed)
Great seeing you! Continue Gabapentin 100mg : take 1 capsule three times a day, then 4 caps at bedtime. Follow-up in 6 months, call for any changes
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
## 2017-07-26 ENCOUNTER — Encounter (INDEPENDENT_AMBULATORY_CARE_PROVIDER_SITE_OTHER): Payer: Self-pay | Admitting: Physical Medicine and Rehabilitation

## 2017-07-26 DIAGNOSIS — G4733 Obstructive sleep apnea (adult) (pediatric): Secondary | ICD-10-CM | POA: Diagnosis not present

## 2017-07-30 ENCOUNTER — Encounter: Payer: Self-pay | Admitting: Sports Medicine

## 2017-08-02 DIAGNOSIS — J3089 Other allergic rhinitis: Secondary | ICD-10-CM | POA: Diagnosis not present

## 2017-08-02 DIAGNOSIS — J3081 Allergic rhinitis due to animal (cat) (dog) hair and dander: Secondary | ICD-10-CM | POA: Diagnosis not present

## 2017-08-02 DIAGNOSIS — J301 Allergic rhinitis due to pollen: Secondary | ICD-10-CM | POA: Diagnosis not present

## 2017-08-02 NOTE — Progress Notes (Signed)
Joshua [name] - 77 y.o. male MRN [id number]  Date of birth: 05-Jun-1940  Office Visit Note: Visit Date: 07/19/2017 PCP: Joshua Olp, MD Referred by: Joshua Olp, MD  Subjective: Chief Complaint  Patient presents with  . Lower Back - Pain  . Left Leg - Pain   HPI: Joshua [name] is a 77 year old gentleman who comes in today at the request of Joshua [name] for evaluation management of his low back pain and left buttock and leg pain.  He reports this is been ongoing for about 2 months.  He reports initially playing pickle ball and having some increased pain and then lifting a bag of cat litter that really set things off.  He really has tried all manner of conservative care including muscle relaxers as well as anti-inflammatory and Medrol Dosepak.  He has had extensive physical therapy.  Ultimately he had MRI of the lumbar spine which is reviewed with the patient today and reviewed below.  Joshua [name] had felt like maybe his pain was referred from his facet joints.  We did talk at length about the sources of back and leg pain and how that relates to his MRI.  Essentially he reports pain in the lower back and left buttock and down the left leg and more of an L5 and S1 distribution.  He has not noted any focal weakness but does feel like the left leg is weak.  He says any movement makes his pain worse.  He reports worsening with standing versus sitting.  He says Tylenol can help the pain a little bit.  He has intolerances to opioid pain medications in general.  He denies any right-sided complaints.  He said no fevers chills or night sweats.  He does have a complicated history of bladder cancer and hereditary and acquired polyneuropathy.   Review of Systems  Constitutional: Negative for chills, fever, malaise/fatigue and weight loss.  HENT: Negative for hearing loss and sinus pain.   Eyes: Negative for blurred vision, double vision and photophobia.  Respiratory: Negative for cough and shortness  of breath.   Cardiovascular: Negative for chest pain, palpitations and leg swelling.  Gastrointestinal: Negative for abdominal pain, nausea and vomiting.  Genitourinary: Negative for flank pain.  Musculoskeletal: Positive for back pain. Negative for myalgias.       Left buttock and leg pain  Skin: Negative for itching and rash.  Neurological: Positive for weakness. Negative for tremors and focal weakness.  Endo/Heme/Allergies: Negative.   Psychiatric/Behavioral: Negative for depression.  All other systems reviewed and are negative.  Otherwise per HPI.  Assessment & Plan: Visit Diagnoses:  1. Lumbar radiculopathy   2. Spondylosis without myelopathy or radiculopathy, lumbar region   3. Spinal stenosis of lumbar region with neurogenic claudication   4. Weakness of left leg   5. Hereditary and idiopathic peripheral neuropathy     Plan: Findings:  Chronic worsening low back and left buttock and leg pain.  His back pain is likely facet joint mediated but his biggest complaint really is his left buttock and leg pain worse with standing and I think this is from the pretty severe stenosis he has at L4-5.  I think the best approach is diagnostic and hopefully therapeutic left L4 transforaminal epidural steroid injection.  Depending on his relief for that could look at facet joint blocks and possible radiofrequency ablation.  If the epidural injection is very short-lived I would obviously try to repeat that versus interlaminar approach below the level  of stenosis.  If the epidural injections are very beneficial but not lasting very long unfortunately from a surgical standpoint with his listhesis this might be an issue where contemplation would be given to fusion.  Alternative treatment would be spinal cord stimulator trial.    Meds & Orders:  Meds ordered this encounter  Medications  . DISCONTD: methylPREDNISolone acetate (DEPO-MEDROL) injection 80 mg  . betamethasone acetate-betamethasone sodium  phosphate (CELESTONE) injection 12 mg    Orders Placed This Encounter  Procedures  . XR C-ARM NO REPORT  . Epidural Steroid injection    Follow-up: Return if symptoms worsen or fail to improve.   Procedures: No procedures performed  Lumbosacral Transforaminal Epidural Steroid Injection - Sub-Pedicular Approach with Fluoroscopic Guidance  Patient: Joshua [name]      Date of Birth: 09/14/1940 MRN: [id number] PCP: Joshua Olp, MD      Visit Date: 07/19/2017   Universal Protocol:    Date/Time: 07/19/2017  Consent Given By: the patient  Position: PRONE  Additional Comments: Vital signs were monitored before and after the procedure. Patient was prepped and draped in the usual sterile fashion. The correct patient, procedure, and site was verified.   Injection Procedure Details:  Procedure Site One Meds Administered:  Meds ordered this encounter  Medications  . DISCONTD: methylPREDNISolone acetate (DEPO-MEDROL) injection 80 mg  . betamethasone acetate-betamethasone sodium phosphate (CELESTONE) injection 12 mg    Laterality: Left  Location/Site:  L4-L5  Needle size: 22 G  Needle type: Spinal  Needle Placement: Transforaminal  Findings:    -Comments: Excellent flow of contrast along the nerve and into the epidural space.  Procedure Details: After squaring off the end-plates to get a true AP view, the C-arm was positioned so that an oblique view of the foramen as noted above was visualized. The target area is just inferior to the "nose of the scotty dog" or sub pedicular. The soft tissues overlying this structure were infiltrated with 2-3 ml. of 1% Lidocaine without Epinephrine.  The spinal needle was inserted toward the target using a "trajectory" view along the fluoroscope beam.  Under AP and lateral visualization, the needle was advanced so it did not puncture dura and was located close the 6 O'Clock position of the pedical in AP tracterory. Biplanar  projections were used to confirm position. Aspiration was confirmed to be negative for CSF and/or blood. A 1-2 ml. volume of Isovue-250 was injected and flow of contrast was noted at each level. Radiographs were obtained for documentation purposes.   After attaining the desired flow of contrast documented above, a 0.5 to 1.0 ml test dose of 0.25% Marcaine was injected into each respective transforaminal space.  The patient was observed for 90 seconds post injection.  After no sensory deficits were reported, and normal lower extremity motor function was noted,   the above injectate was administered so that equal amounts of the injectate were placed at each foramen (level) into the transforaminal epidural space.   Additional Comments:  The patient tolerated the procedure well Dressing: Band-Aid    Post-procedure details: Patient was observed during the procedure. Post-procedure instructions were reviewed.  Patient left the clinic in stable condition.    Clinical History: MRI LUMBAR SPINE WITHOUT CONTRAST  TECHNIQUE: Multiplanar, multisequence MR imaging of the lumbar spine was performed. No intravenous contrast was administered.  COMPARISON:  Lumbar radiography 05/27/2017  FINDINGS: Segmentation: 5 non-rib-bearing vertebrae on comparison radiography.  Alignment:  Mild levoscoliosis.  Grade 1 anterolisthesis at L4-5.  Vertebrae: Degenerative appearing marrow edema about the left L5-S1 facet. No evidence of fracture, discitis, or aggressive bone lesion.  Conus medullaris and cauda equina: Conus extends to the L1-2 level. Conus and cauda equina appear normal.  Paraspinal and other soft tissues: Small bilateral renal cystic intensities.  Disc levels:  T12- L1: Spondylosis.  No impingement.  L1-L2: Spondylosis with disc narrowing and bulging. Mild facet spurring. No impingement.  L2-L3: Degenerative posterior element hypertrophy.  No impingement  L3-L4:  Degenerative posterior element hypertrophy.  No impingement  L4-L5: Severe facet degeneration with bulky hypertrophy and joint distortion. Small bilateral joint effusion. There is grade 1 anterolisthesis. The disc is narrowed and desiccated with mild bulging. Advanced spinal stenosis. Right more than left moderate foraminal narrowing.  L5-S1:Severe degenerative facet arthropathy with active facet arthritis on the left. The joints are markedly spurred and hypertrophic with bilateral joint effusion. Mild spinal stenosis. Hypertrophy could affect the descending left S1 nerve root. Moderate left and mild right foraminal narrowing.  IMPRESSION: 1. 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 2. L4-5 advanced spinal stenosis. Moderate right more than left foraminal narrowing. 3. L5-S1 moderate left and mild right foraminal narrowing. Left subarticular recess narrowing that could affect the left S1 nerve root. 4. Mild levoscoliosis.   Electronically Signed   By: Monte Fantasia M.D.   On: 07/01/2017 10:16   He reports that he quit smoking about 25 years ago. His smoking use included cigarettes. He has a 35.00 pack-year smoking history. He has never used smokeless tobacco. No results for input(s): HGBA1C, LABURIC in the last 8760 hours.  Objective:  VS:  HT:    WT:   BMI:     BP:135/83  HR: bpm  TEMP: ( )  RESP:  Physical Exam  Constitutional: He is oriented to person, place, and time. He appears well-developed and well-nourished. No distress.  HENT:  Head: Normocephalic and atraumatic.  Nose: Nose normal.  Mouth/Throat: Oropharynx is clear and moist.  Eyes: Pupils are equal, round, and reactive to light. Conjunctivae are normal.  Neck: Normal range of motion. Neck supple. No tracheal deviation present.  Cardiovascular: Regular rhythm and intact distal pulses.  Pulmonary/Chest:  Effort normal and breath sounds normal.  Abdominal: Soft. He exhibits no distension. There is no rebound and no guarding.  Musculoskeletal: He exhibits no deformity.  Patient has some difficulty going from sit to stand and he has concordant low back pain with facet joint loading and extension of the lumbar spine.  He has no pain over the greater trochanters and no pain with hip rotation he has good distal strength.  He has no clonus.  Neurological: He is alert and oriented to person, place, and time. He exhibits normal muscle tone. Coordination normal.  Skin: Skin is warm. No rash noted.  Psychiatric: He has a normal mood and affect. His behavior is normal.  Nursing note and vitals reviewed.   Ortho Exam Imaging: No results found.  Past Medical/Family/Surgical/Social History: Medications & Allergies reviewed per EMR, new medications updated. Patient Active Problem List   Diagnosis Date Noted  . Weakness of left leg 06/21/2017  . DJD (degenerative joint disease)   . Arthritis   . Neck pain 02/08/2017  . Venous insufficiency 12/29/2016  . Ganglion cyst of tendon sheath of right hand 12/29/2016  . Avulsion fracture of metatarsal bone of right foot 11/25/2016  . Gout 11/20/2016  .  Obesity, Class I, BMI 30-34.9 10/03/2015  . Hereditary and idiopathic peripheral neuropathy 09/25/2014  . Allergic rhinitis 05/31/2014  . Fatigue 11/09/2013  . CKD (chronic kidney disease), stage III ([place]) 10/23/2013  . Dizziness 08/23/2013  . Neuropathy ([place]) 08/21/2013  . RLS (restless legs syndrome) 01/03/2013  . Low back pain radiating to left leg 10/09/2011  . History of bladder cancer 04/01/2010  . OSTEOARTHRITIS, LOWER LEG, LEFT 10/04/2009  . TOBACCO ABUSE, HX OF 12/26/2008  . ANEMIA, IRON DEFICIENCY 11/06/2008  . 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 Essential hypertension 02/21/2007  . OSA (obstructive sleep apnea) 11/02/2006  . Hyperlipidemia 10/12/2006  . DEPRESSION 10/12/2006  .  Coronary atherosclerosis 10/12/2006  . Asthma 10/12/2006  . GERD 10/12/2006  . Irritable bowel syndrome 10/12/2006   Past Medical History:  Diagnosis Date  . Acute medial meniscal tear   . Anemia   . Arthritis    "middle finger right hand; right knee; neck" (02/06/2014)  . Asthma   . Bladder cancer ([place]) 04/2010   "cauterized during prostate OR"  . CAD (coronary artery disease)    CABG 2001  . Depression   . Diverticulosis   . DJD (degenerative joint disease)   . GERD (gastroesophageal reflux disease)   . History of blood transfusion 2001   "related to the bypass OR"  . History of blood transfusion 03/24/1999   "related to the bypass OR"  . History of gout   . Hyperlipidemia   . Hypertension   . IBS (irritable bowel syndrome)   . Obesity   . OSA (obstructive sleep apnea)   . Pancreatitis ~ 1980  . Pneumonia 1980's X 2  . Prostate cancer ([place]) 04/2010   Family History  Problem Relation Age of Onset  . Heart disease Mother   . Hyperlipidemia Mother   . Heart disease Father   . Hyperlipidemia Father   . Diabetes Brother    Past Surgical History:  Procedure Laterality Date  . ANTERIOR CERVICAL DECOMP/DISCECTOMY FUSION  05/26/06  . BACK SURGERY    . CARDIAC CATHETERIZATION  11/20/1999  . CORONARY ARTERY BYPASS GRAFT  11/20/1999   SVG-RI1-RI2, SVG-OM, SVG-dRCA  . KNEE ARTHROSCOPY Right 1992; 04/2008  . LAPAROSCOPIC CHOLECYSTECTOMY  2007  . NASAL SEPTUM SURGERY Right 2007  . ROBOT ASSISTED LAPAROSCOPIC RADICAL PROSTATECTOMY  04/2010  . THYROIDECTOMY, PARTIAL  1980   Social History   Occupational History  . Occupation: retired    Fish farm manager: RETIRED  Tobacco Use  . Smoking status: Former Smoker    Packs/day: 1.00    Years: 35.00    Pack years: 35.00    Types: Cigarettes    Last attempt to quit: 06/16/1992    Years since quitting: 25.1  . Smokeless tobacco: Never Used  . Tobacco comment: discussed the AAA but thinks he may have had this   Substance and Sexual Activity    . Alcohol use: Yes    Alcohol/week: 2.4 oz    Types: 4 Shots of liquor per week    Comment: mixed drink  . Drug use: No  . Sexual activity: Never

## 2017-08-02 NOTE — Procedures (Signed)
Lumbosacral Transforaminal Epidural Steroid Injection - Sub-Pedicular Approach with Fluoroscopic Guidance  Patient: Joshua [name]      Date of Birth: [birth year] MRN: [id number] PCP: [name], MD      Visit Date: 07/19/2017   Universal Protocol:    Date/Time: 07/19/2017  Consent Given By: the patient  Position: PRONE  Additional Comments: Vital signs were monitored before and after the procedure. Patient was prepped and draped in the usual sterile fashion. The correct patient, procedure, and site was verified.   Injection Procedure Details:  Procedure Site One Meds Administered:  Meds ordered this encounter  Medications  . DISCONTD: methylPREDNISolone acetate (DEPO-MEDROL) injection 80 mg  . betamethasone acetate-betamethasone sodium phosphate (CELESTONE) injection 12 mg    Laterality: Left  Location/Site:  L4-L5  Needle size: 22 G  Needle type: Spinal  Needle Placement: Transforaminal  Findings:    -Comments: Excellent flow of contrast along the nerve and into the epidural space.  Procedure Details: After squaring off the end-plates to get a true AP view, the C-arm was positioned so that an oblique view of the foramen as noted above was visualized. The target area is just inferior to the "nose of the scotty dog" or sub pedicular. The soft tissues overlying this structure were infiltrated with 2-3 ml. of 1% Lidocaine without Epinephrine.  The spinal needle was inserted toward the target using a "trajectory" view along the fluoroscope beam.  Under AP and lateral visualization, the needle was advanced so it did not puncture dura and was located close the 6 O'Clock position of the pedical in AP tracterory. Biplanar projections were used to confirm position. Aspiration was confirmed to be negative for CSF and/or blood. A 1-2 ml. volume of Isovue-250 was injected and flow of contrast was noted at each level. Radiographs were obtained for documentation purposes.    After attaining the desired flow of contrast documented above, a 0.5 to 1.0 ml test dose of 0.25% Marcaine was injected into each respective transforaminal space.  The patient was observed for 90 seconds post injection.  After no sensory deficits were reported, and normal lower extremity motor function was noted,   the above injectate was administered so that equal amounts of the injectate were placed at each foramen (level) into the transforaminal epidural space.   Additional Comments:  The patient tolerated the procedure well Dressing: Band-Aid    Post-procedure details: Patient was observed during the procedure. Post-procedure instructions were reviewed.  Patient left the clinic in stable condition.

## 2017-08-03 ENCOUNTER — Encounter: Payer: Self-pay | Admitting: Family Medicine

## 2017-08-04 NOTE — Progress Notes (Signed)
Subjective:   Joshua [name] is a 77 y.o. male who presents for Medicare Annual/Subsequent preventive examination.  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 Yong Channel reviewed CT of pelvis and waived the AAA as aorta was reviewed at this time Smokeless tobacco - no Second Hand Smoke status; No Smokers in the home ETOH - 2.4 oz;   There are no preventive care reminders to display for this patient.  PSA 03/2012 Colonoscopy  11/2008 and due 2020   Educated regarding shingrix   Volunteers at the desk at [employer] start Friday at the surgical waiting room   Cardiac Risk Factors include: advanced age (>59men, >21 women);family history of premature cardiovascular disease;dyslipidemia;male gender;hypertension     Objective:    Vitals: BP 110/70   Pulse 76   Ht 5' 6.7" (1.694 m)   Wt 218 lb (98.9 kg)   SpO2 96%   BMI 34.45 kg/m   Body mass index is 34.45 kg/m.  Advanced Directives 08/05/2017 01/20/2017 11/12/2016 05/27/2016 08/29/2014 08/22/2014 02/06/2014  Does Patient Have a Medical Advance Directive? Yes Yes No;Yes Yes Yes Yes No;Yes  Type of Advance Directive - [place];Living will - - Special educational needs teacher of [place];Living will -  Does patient want to make changes to medical advance directive? - No - Patient declined - - - - No - Patient  declined  Copy of [place] in Chart? - No - copy requested - - No - copy requested No - copy requested -  Would patient like information on creating a medical advance directive? - - - - - - No - patient declined information    Tobacco Social History   Tobacco Use  Smoking Status Former Smoker  . Packs/day: 1.00  . Years: 35.00  . Pack years: 35.00  . Types: Cigarettes  . Last attempt to quit: 06/16/1992  . Years since quitting: 25.1  Smokeless Tobacco Never Used  Tobacco Comment   discussed the AAA but thinks he may have had this      Counseling given: Yes Comment: discussed the AAA but thinks he may have had this    Clinical Intake:     Past Medical History:  Diagnosis Date  . Acute medial meniscal tear   . Anemia   . Arthritis    "middle finger right hand; right knee; neck" (02/06/2014)  . Asthma   . Bladder cancer ([place]) 04/2010   "cauterized during prostate OR"  . CAD (coronary artery disease)    CABG 2001  . Depression   . Diverticulosis   . DJD (degenerative joint disease)   . GERD (gastroesophageal reflux disease)   . History of blood transfusion 2001   "related to the bypass OR"  . History  of blood transfusion 03/24/1999   "related to the bypass OR"  . History of gout   . Hyperlipidemia   . Hypertension   . IBS (irritable bowel syndrome)   . Obesity   . OSA (obstructive sleep apnea)   . Pancreatitis ~ 1980  . Pneumonia 1980's X 2  . Prostate cancer ([place]) 04/2010   Past Surgical History:  Procedure Laterality Date  . ANTERIOR CERVICAL DECOMP/DISCECTOMY FUSION  05/26/06  . BACK SURGERY    . CARDIAC CATHETERIZATION  11/20/1999  . CORONARY ARTERY BYPASS GRAFT  11/20/1999   SVG-RI1-RI2, SVG-OM, SVG-dRCA  . KNEE ARTHROSCOPY Right 1992; 04/2008  . LAPAROSCOPIC CHOLECYSTECTOMY  2007  . NASAL SEPTUM SURGERY Right 2007  . ROBOT ASSISTED LAPAROSCOPIC RADICAL PROSTATECTOMY  04/2010  . THYROIDECTOMY, PARTIAL  1980   Family History  Problem  Relation Age of Onset  . Heart disease Mother   . Hyperlipidemia Mother   . Heart disease Father   . Hyperlipidemia Father   . Diabetes Brother    Social History   Socioeconomic History  . Marital status: Married    Spouse name: Not on file  . Number of children: Not on file  . Years of education: Not on file  . Highest education level: Not on file  Occupational History  . Occupation: retired    Fish farm manager: RETIRED  Social Needs  . Financial resource strain: Not on file  . Food insecurity:    Worry: Not on file    Inability: Not on file  . Transportation needs:    Medical: Not on file    Non-medical: Not on file  Tobacco Use  . Smoking status: Former Smoker    Packs/day: 1.00    Years: 35.00    Pack years: 35.00    Types: Cigarettes    Last attempt to quit: 06/16/1992    Years since quitting: 25.1  . Smokeless tobacco: Never Used  . Tobacco comment: discussed the AAA but thinks he may have had this   Substance and Sexual Activity  . Alcohol use: Yes    Alcohol/week: 2.4 oz    Types: 4 Shots of liquor per week    Comment: mixed drink  . Drug use: No  . Sexual activity: Never  Lifestyle  . Physical activity:    Days per week: Not on file    Minutes per session: Not on file  . Stress: Not on file  Relationships  . Social connections:    Talks on phone: Not on file    Gets together: Not on file    Attends religious service: Not on file    Active member of club or organization: Not on file    Attends meetings of clubs or organizations: Not on file    Relationship status: Not on file  Other Topics Concern  . Not on file  Social History Narrative   Married 42 years in 2015. No kids (mumps at age 74)   Retired from [employer] in [employer] 2x a week   Hobbies: poker, movies and tv, staying active    Outpatient Encounter Medications as of 08/05/2017  Medication Sig  . acetaminophen (TYLENOL) 500 MG tablet Take 1,000 mg by mouth every 6 (six) hours as  needed for headache (pain). Reported on 07/02/2015  . albuterol (PROAIR HFA) 108 (90 BASE) MCG/ACT inhaler Inhale 1-2 puffs into the lungs every 6 (six) hours as needed for wheezing or shortness of breath.  [name] aspirin EC 81  MG tablet Take 81 mg by mouth daily.  . Cyanocobalamin (VITAMIN B 12 PO) Take 800 mg by mouth daily.  [name] desloratadine (CLARINEX) 5 MG tablet Take 5 mg by mouth at bedtime.   . diazepam (VALIUM) 5 MG tablet Take 1 tablet (5 mg total) by mouth at bedtime as needed.  . Evolocumab (REPATHA SURECLICK) 277 MG/ML SOAJ Inject 140 mg into the skin every 14 (fourteen) days.  [name] FLOVENT HFA 110 MCG/ACT inhaler Inhale 2 puffs into the lungs 2 (two) times daily. Reported on 03/22/2015  . fluticasone (FLONASE) 50 MCG/ACT nasal spray Place 1 spray into both nostrils 2 (two) times daily.   . folic acid (FOLVITE) 412 MCG tablet Take 400 mcg by mouth daily.  [name] gabapentin (NEURONTIN) 100 MG capsule Take 1 cap three times a day, 4 caps at bedtime  . hydrochlorothiazide (HYDRODIURIL) 25 MG tablet Take 1 tablet (25 mg total) by mouth daily.  . irbesartan (AVAPRO) 300 MG tablet Take 1 tablet (300 mg total) by mouth daily.  [name] ketoconazole (NIZORAL) 2 % cream Apply topically 2 (two) times daily.  . NON FORMULARY   . nystatin (MYCOSTATIN/NYSTOP) powder Apply topically 4 (four) times daily.  [name] omeprazole (PRILOSEC) 20 MG capsule Take 1 capsule (20 mg total) by mouth daily.  . ondansetron (ZOFRAN ODT) 8 MG disintegrating tablet Take 1 tablet (8 mg total) by mouth every 8 (eight) hours as needed for nausea or vomiting.  . polyethylene glycol powder (GLYCOLAX/MIRALAX) powder Take 17 g by mouth daily.  . Probiotic Product (ALIGN) 4 MG CAPS Take 4 mg by mouth daily.   . simvastatin (ZOCOR) 20 MG tablet Take 1 tablet (20 mg total) by mouth at bedtime.   No facility-administered encounter medications on file as of 08/05/2017.     Activities of Daily Living In your present state of health, do you have any  difficulty performing the following activities: 08/05/2017  Hearing? Y  Vision? N  Difficulty concentrating or making decisions? N  Walking or climbing stairs? N  Dressing or bathing? N  Doing errands, shopping? N  Preparing Food and eating ? N  Using the Toilet? N  In the past six months, have you accidently leaked urine? Y  Comment issues since prostate surgery; kagels gives him pain; does were a pad  Do you have problems with loss of bowel control? N  Managing your Medications? N  Managing your Finances? N  Housekeeping or managing your Housekeeping? N  Some recent data might be hidden    Patient Care Team: [name], MD as PCP - General (Family Medicine) [name], MD as PCP - Cardiology (Cardiology)   Assessment:   This is a routine wellness examination for [place].  Exercise Activities and Dietary recommendations Current Exercise Habits: Home exercise routine;Structured exercise class, Type of exercise: strength training/weights;walking, Time (Minutes): 50, Frequency (Times/Week): 5, Weekly Exercise (Minutes/Week): 250, Intensity: Moderate  Goals    . Weight (lb) < 190 lb (86.2 kg)     Keep cutting back on portions Keep exercising        Fall Risk Fall Risk  08/05/2017 07/20/2017 03/26/2017 01/18/2017 11/12/2016  Falls in the past year? No No No No No  Comment - - - - -     Depression Screen PHQ 2/9 Scores 08/05/2017 03/03/2017 11/12/2016 05/27/2016  PHQ - 2 Score 0 0 0 0  PHQ- 9 Score - - - -   CBD for mood; states it is better  Cognitive Function  MMSE - Mini Mental State Exam 08/05/2017 05/27/2016  Not completed: (No Data) (No Data)   Montreal Cognitive Assessment  09/25/2014  Visuospatial/ Executive (0/5) 5  Naming (0/3) 3  Attention: Read list of digits (0/2) 2  Attention: Read list of letters (0/1) 1  Attention: Serial 7 subtraction starting at 100 (0/3) 3  Language: Repeat phrase (0/2) 2  Language : Fluency (0/1) 1  Abstraction (0/2) 2  Delayed  Recall (0/5) 3  Orientation (0/6) 6  Total 28  Adjusted Score (based on education) 28    Neurology tested and did MRI  [name] is following  Enjoys playing poker routinely      Immunization History  Administered Date(s) Administered  . Influenza Split 01/08/2012, 11/12/2014  . Influenza Whole 12/08/2007, 12/26/2008, 12/22/2010  . Influenza, High Dose Seasonal PF 11/16/2015, 01/01/2017  . Influenza,inj,Quad PF,6+ Mos 11/30/2012  . Influenza-Unspecified 01/12/2014  . Pneumococcal Conjugate-13 05/31/2014  . Pneumococcal Polysaccharide-23 06/04/2006, 07/15/2012  . Td 04/23/2005  . Tdap 02/18/2016  . Zoster 01/08/2012      Screening Tests Health Maintenance  Topic Date Due  . INFLUENZA VACCINE  10/21/2017  . COLONOSCOPY  12/13/2018  . TETANUS/TDAP  02/17/2026  . PNA vac Low Risk Adult  Completed         Plan:      PCP Notes   Health Maintenance Educated regarding the shingrix   Trying to lose weight, recent steroid shot is impeding progress but will continue with his exercise routine working up to 60 minutes, 5 days a week   Abnormal Screens  Waived MMSE; Seeing [name]; recall 3/3  Is taking CBD oil orally for assistance with recall and minor depression. Feels it is helping   Referrals  none  Patient concerns; None voiced; still enjoys his life    Nurse Concerns; As noted  Next PCP apt 10/07/2017      I have personally reviewed and noted the following in the patient's chart:   . Medical and social history . Use of alcohol, tobacco or illicit drugs  . Current medications and supplements . Functional ability and status . Nutritional status . Physical activity . Advanced directives . List of other physicians . Hospitalizations, surgeries, and ER visits in previous 12 months . Vitals . Screenings to include cognitive, depression, and falls . Referrals and appointments  In addition, I have reviewed and discussed with patient certain  preventive protocols, quality metrics, and best practice recommendations. A written personalized care plan for preventive services as well as general preventive health recommendations were provided to patient.     Wynetta Fines, RN  08/05/2017

## 2017-08-05 ENCOUNTER — Other Ambulatory Visit: Payer: Self-pay

## 2017-08-05 ENCOUNTER — Ambulatory Visit (INDEPENDENT_AMBULATORY_CARE_PROVIDER_SITE_OTHER): Payer: PPO | Admitting: *Deleted

## 2017-08-05 ENCOUNTER — Encounter: Payer: Self-pay | Admitting: *Deleted

## 2017-08-05 VITALS — BP 110/70 | HR 76 | Ht 66.7 in | Wt 218.0 lb

## 2017-08-05 DIAGNOSIS — Z0000 Encounter for general adult medical examination without abnormal findings: Secondary | ICD-10-CM

## 2017-08-05 MED ORDER — NYSTATIN 100000 UNIT/GM EX POWD: Freq: Four times a day (QID) | CUTANEOUS | 0 refills | Status: DC

## 2017-08-05 NOTE — Progress Notes (Signed)
I have reviewed and agree with note, evaluation, plan.  I am happy to see him for follow-up if he believes he has worsening symptoms in regard to reported minor depression  [name], MD

## 2017-08-05 NOTE — Patient Instructions (Addendum)
Joshua [name] , Thank you for taking time to come for your Medicare Wellness Visit. I appreciate your ongoing commitment to your health goals. Please review the following plan we discussed and let me know if I can assist you in the future.   Shingrix is a vaccine for the prevention of Shingles in Adults 50 and older.  If you are on Medicare, the shingrix is covered under your Part D plan, so you will take both of the vaccines in the series at your pharmacy. Please check with your benefits regarding applicable copays or out of pocket expenses.  The Shingrix is given in 2 vaccines approx 8 weeks apart. You must receive the 2nd dose prior to 6 months from receipt of the first. Please have the pharmacist print out you Immunization  dates for our office records   Check with [facility] regarding your vaccinations  These are the goals we discussed: Goals    . Weight (lb) < 190 lb (86.2 kg)     Keep cutting back on portions Keep exercising        This is a list of the screening recommended for you and due dates:  Health Maintenance  Topic Date Due  . Flu Shot  10/21/2017  . Colon Cancer Screening  12/13/2018  . Tetanus Vaccine  02/17/2026  . Pneumonia vaccines  Completed    Prevention of falls: Remove rugs or any tripping hazards in the home Use Non slip mats in bathtubs and showers Placing grab bars next to the toilet and or shower Placing handrails on both sides of the stair way Adding extra lighting in the home.   Personal safety issues reviewed:  1. Consider starting a community watch program per [facility] 2.  Changes batteries is smoke detector and/or carbon monoxide detector  3.  If you have firearms; keep them in a safe place 4.  Wear protection when in the sun; Always wear sunscreen or a hat; It is good to have your doctor check your skin annually or review any new areas of concern 5. Driving safety; Keep in the right lane; stay 3 car lengths behind the car in front  of you on the highway; look 3 times prior to pulling out; carry your cell phone everywhere you go!      Fall Prevention in the Home Falls can cause injuries. They can happen to people of all ages. There are many things you can do to make your home safe and to help prevent falls. What can I do on the outside of my home?  Regularly fix the edges of walkways and driveways and fix any cracks.  Remove anything that might make you trip as you walk through a door, such as a raised step or threshold.  Trim any bushes or trees on the path to your home.  Use bright outdoor lighting.  Clear any walking paths of anything that might make someone trip, such as rocks or tools.  Regularly check to see if handrails are loose or broken. Make sure that both sides of any steps have handrails.  Any raised decks and porches should have guardrails on the edges.  Have any leaves, snow, or ice cleared regularly.  Use sand or salt on walking paths during winter.  Clean up any spills in your garage right away. This includes oil or grease spills. What can I do in the bathroom?  Use night lights.  Install grab bars by the toilet and in the tub and shower.  Do not use towel bars as grab bars.  Use non-skid mats or decals in the tub or shower.  If you need to sit down in the shower, use a plastic, non-slip stool.  Keep the floor dry. Clean up any water that spills on the floor as soon as it happens.  Remove soap buildup in the tub or shower regularly.  Attach bath mats securely with double-sided non-slip rug tape.  Do not have throw rugs and other things on the floor that can make you trip. What can I do in the bedroom?  Use night lights.  Make sure that you have a light by your bed that is easy to reach.  Do not use any sheets or blankets that are too big for your bed. They should not hang down onto the floor.  Have a firm chair that has side arms. You can use this for support while you get  dressed.  Do not have throw rugs and other things on the floor that can make you trip. What can I do in the kitchen?  Clean up any spills right away.  Avoid walking on wet floors.  Keep items that you use a lot in easy-to-reach places.  If you need to reach something above you, use a strong step stool that has a grab bar.  Keep electrical cords out of the way.  Do not use floor polish or wax that makes floors slippery. If you must use wax, use non-skid floor wax.  Do not have throw rugs and other things on the floor that can make you trip. What can I do with my stairs?  Do not leave any items on the stairs.  Make sure that there are handrails on both sides of the stairs and use them. Fix handrails that are broken or loose. Make sure that handrails are as long as the stairways.  Check any carpeting to make sure that it is firmly attached to the stairs. Fix any carpet that is loose or worn.  Avoid having throw rugs at the top or bottom of the stairs. If you do have throw rugs, attach them to the floor with carpet tape.  Make sure that you have a light switch at the top of the stairs and the bottom of the stairs. If you do not have them, ask someone to add them for you. What else can I do to help prevent falls?  Wear shoes that: ? Do not have high heels. ? Have rubber bottoms. ? Are comfortable and fit you well. ? Are closed at the toe. Do not wear sandals.  If you use a stepladder: ? Make sure that it is fully opened. Do not climb a closed stepladder. ? Make sure that both sides of the stepladder are locked into place. ? Ask someone to hold it for you, if possible.  Clearly mark and make sure that you can see: ? Any grab bars or handrails. ? First and last steps. ? Where the edge of each step is.  Use tools that help you move around (mobility aids) if they are needed. These include: ? Canes. ? Walkers. ? Scooters. ? Crutches.  Turn on the lights when you go into a  dark area. Replace any light bulbs as soon as they burn out.  Set up your furniture so you have a clear path. Avoid moving your furniture around.  If any of your floors are uneven, fix them.  If there are any pets around you, be  aware of where they are.  Review your medicines with your doctor. Some medicines can make you feel dizzy. This can increase your chance of falling. Ask your doctor what other things that you can do to help prevent falls. This information is not intended to replace advice given to you by your health care provider. Make sure you discuss any questions you have with your health care provider. Document Released: 01/03/2009 Document Revised: 08/15/2015 Document Reviewed: 04/13/2014 Elsevier Interactive Patient Education  2018 [place] Maintenance, Male A healthy lifestyle and preventive care is important for your health and wellness. Ask your health care provider about what schedule of regular examinations is right for you. What should I know about weight and diet? Eat a Healthy Diet  Eat plenty of vegetables, fruits, whole grains, low-fat dairy products, and lean protein.  Do not eat a lot of foods high in solid fats, added sugars, or salt.  Maintain a Healthy Weight Regular exercise can help you achieve or maintain a healthy weight. You should:  Do at least 150 minutes of exercise each week. The exercise should increase your heart rate and make you sweat (moderate-intensity exercise).  Do strength-training exercises at least twice a week.  Watch Your Levels of Cholesterol and Blood Lipids  Have your blood tested for lipids and cholesterol every 5 years starting at 75 years of age. If you are at high risk for heart disease, you should start having your blood tested when you are 75 years old. You may need to have your cholesterol levels checked more often if: ? Your lipid or cholesterol levels are high. ? You are older than 75 years of age. ? You  are at high risk for heart disease.  What should I know about cancer screening? Many types of cancers can be detected early and may often be prevented. Lung Cancer  You should be screened every year for lung cancer if: ? You are a current smoker who has smoked for at least 30 years. ? You are a former smoker who has quit within the past 15 years.  Talk to your health care provider about your screening options, when you should start screening, and how often you should be screened.  Colorectal Cancer  Routine colorectal cancer screening usually begins at 75 years of age and should be repeated every 5-10 years until you are 75 years old. You may need to be screened more often if early forms of precancerous polyps or small growths are found. Your health care provider may recommend screening at an earlier age if you have risk factors for colon cancer.  Your health care provider may recommend using home test kits to check for hidden blood in the stool.  A small camera at the end of a tube can be used to examine your colon (sigmoidoscopy or colonoscopy). This checks for the earliest forms of colorectal cancer.  Prostate and Testicular Cancer  Depending on your age and overall health, your health care provider may do certain tests to screen for prostate and testicular cancer.  Talk to your health care provider about any symptoms or concerns you have about testicular or prostate cancer.  Skin Cancer  Check your skin from head to toe regularly.  Tell your health care provider about any new moles or changes in moles, especially if: ? There is a change in a mole's size, shape, or color. ? You have a mole that is larger than a pencil eraser.  Always use  sunscreen. Apply sunscreen liberally and repeat throughout the day.  Protect yourself by wearing long sleeves, pants, a wide-brimmed hat, and sunglasses when outside.  What should I know about heart disease, diabetes, and high blood  pressure?  If you are 88-46 years of age, have your blood pressure checked every 3-5 years. If you are 80 years of age or older, have your blood pressure checked every year. You should have your blood pressure measured twice-once when you are at a hospital or clinic, and once when you are not at a hospital or clinic. Record the average of the two measurements. To check your blood pressure when you are not at a hospital or clinic, you can use: ? An automated blood pressure machine at a pharmacy. ? A home blood pressure monitor.  Talk to your health care provider about your target blood pressure.  If you are between 36-13 years old, ask your health care provider if you should take aspirin to prevent heart disease.  Have regular diabetes screenings by checking your fasting blood sugar level. ? If you are at a normal weight and have a low risk for diabetes, have this test once every three years after the age of 13. ? If you are overweight and have a high risk for diabetes, consider being tested at a younger age or more often.  A one-time screening for abdominal aortic aneurysm (AAA) by ultrasound is recommended for men aged 38-75 years who are current or former smokers. What should I know about preventing infection? Hepatitis B If you have a higher risk for hepatitis B, you should be screened for this virus. Talk with your health care provider to find out if you are at risk for hepatitis B infection. Hepatitis C Blood testing is recommended for:  Everyone born from [birth year] through 1963.  Anyone with known risk factors for hepatitis C.  Sexually Transmitted Diseases (STDs)  You should be screened each year for STDs including gonorrhea and chlamydia if: ? You are sexually active and are younger than 75 years of age. ? You are older than 75 years of age and your health care provider tells you that you are at risk for this type of infection. ? Your sexual activity has changed since you were last  screened and you are at an increased risk for chlamydia or gonorrhea. Ask your health care provider if you are at risk.  Talk with your health care provider about whether you are at high risk of being infected with HIV. Your health care provider may recommend a prescription medicine to help prevent HIV infection.  What else can I do?  Schedule regular health, dental, and eye exams.  Stay current with your vaccines (immunizations).  Do not use any tobacco products, such as cigarettes, chewing tobacco, and e-cigarettes. If you need help quitting, ask your health care provider.  Limit alcohol intake to no more than 2 drinks per day. One drink equals 12 ounces of beer, 5 ounces of wine, or 1 ounces of hard liquor.  Do not use street drugs.  Do not share needles.  Ask your health care provider for help if you need support or information about quitting drugs.  Tell your health care provider if you often feel depressed.  Tell your health care provider if you have ever been abused or do not feel safe at home. This information is not intended to replace advice given to you by your health care provider. Make sure you discuss any questions you  have with your health care provider. Document Released: 09/05/2007 Document Revised: 11/06/2015 Document Reviewed: 12/11/2014 Elsevier Interactive Patient Education  [employer].

## 2017-08-12 ENCOUNTER — Telehealth: Payer: Self-pay

## 2017-08-12 NOTE — Telephone Encounter (Signed)
Copied from [place] [id number]. Topic: General - Other >> Aug 12, 2017  3:56 PM Ivar Drape wrote: Reason for CRM:   Patient is experiencing pain in his lower middle back.  The patient has an appt on 08/26/17 to see Dr. Paulla Fore, he wants to know should he come in earlier to see Dr. Paulla Fore or should he get Dr. Conley Simmonds to give him a steroid injection.  Please advise.

## 2017-08-12 NOTE — Telephone Encounter (Signed)
Pt had L4-L5 transforaminal injection 07/19/17, please advise.

## 2017-08-13 NOTE — Telephone Encounter (Signed)
Okay to refer back to Dr. [name] for repeat vs Alternative approach ESI

## 2017-08-13 NOTE — Telephone Encounter (Signed)
Called pt and advised. He will contact Dr. Romona Curls office.

## 2017-08-17 DIAGNOSIS — J301 Allergic rhinitis due to pollen: Secondary | ICD-10-CM | POA: Diagnosis not present

## 2017-08-17 DIAGNOSIS — J3089 Other allergic rhinitis: Secondary | ICD-10-CM | POA: Diagnosis not present

## 2017-08-17 DIAGNOSIS — J3081 Allergic rhinitis due to animal (cat) (dog) hair and dander: Secondary | ICD-10-CM | POA: Diagnosis not present

## 2017-08-26 ENCOUNTER — Ambulatory Visit (INDEPENDENT_AMBULATORY_CARE_PROVIDER_SITE_OTHER): Payer: PPO | Admitting: Sports Medicine

## 2017-08-26 ENCOUNTER — Encounter: Payer: Self-pay | Admitting: Sports Medicine

## 2017-08-26 ENCOUNTER — Ambulatory Visit: Payer: PPO | Admitting: Family Medicine

## 2017-08-26 ENCOUNTER — Ambulatory Visit: Payer: Self-pay

## 2017-08-26 VITALS — BP 120/76 | HR 82 | Ht 66.0 in | Wt 216.2 lb

## 2017-08-26 DIAGNOSIS — M65341 Trigger finger, right ring finger: Secondary | ICD-10-CM

## 2017-08-26 DIAGNOSIS — R29898 Other symptoms and signs involving the musculoskeletal system: Secondary | ICD-10-CM

## 2017-08-26 DIAGNOSIS — M79641 Pain in right hand: Secondary | ICD-10-CM

## 2017-08-26 DIAGNOSIS — G4733 Obstructive sleep apnea (adult) (pediatric): Secondary | ICD-10-CM | POA: Diagnosis not present

## 2017-08-26 NOTE — Patient Instructions (Signed)

## 2017-08-26 NOTE — Procedures (Signed)
PROCEDURE NOTE:  Ultrasound Guided: Injection: Right 4th finger trigger finger injection. Images were obtained and interpreted by myself, Teresa Coombs, DO  Images have been saved and stored to PACS system. Images obtained on: GE S7 Ultrasound machine    ULTRASOUND FINDINGS:  Significant triggering and hypertrophy of the A1 pulley.  DESCRIPTION OF PROCEDURE:  The patient's clinical condition is marked by substantial pain and/or significant functional disability. Other conservative therapy has not provided relief, is contraindicated, or not appropriate. There is a reasonable likelihood that injection will significantly improve the patient's pain and/or functional impairment.   After discussing the risks, benefits and expected outcomes of the injection and all questions were reviewed and answered, the patient wished to undergo the above named procedure.  Verbal consent was obtained.  The ultrasound was used to identify the target structure and adjacent neurovascular structures. The skin was then prepped in sterile fashion and the target structure was injected under direct visualization using sterile technique as below:  PREP: Alcohol, Ethel Chloride and 1 cc 1% lidocaine on Insulin Needle APPROACH: direct, single injection, 25g 1.5 in. INJECTATE: 0.5 cc 1% lidocaine, 0.5 cc 0.5% Marcaine and 0.5 cc 40mg /mL DepoMedrol ASPIRATE: None DRESSING: Band-Aid  Post procedural instructions including recommending icing and warning signs for infection were reviewed.    This procedure was well tolerated and there were no complications.   IMPRESSION: Succesful Ultrasound Guided: Injection

## 2017-08-26 NOTE — Progress Notes (Signed)
Joshua [name]. Joshua [name], [place] [place] [facility] [place]  Joshua [name] - 77 y.o. male MRN [id number]  Date of birth: 1940-04-12  Visit Date: 08/26/2017  PCP: Joshua Olp, MD   Referred by: Joshua Olp, MD  Scribe for today's visit: Joshua [name], LAT, ATC     SUBJECTIVE:  Joshua [name] is here for Follow-up (low back pain) .   05/27/17: His thoracic and lumbar pain symptoms INITIALLY: Began a week prior to presentation after playing pickle ball. He felt pain on the R side of his back. A few days later he lifted a heavy bag a cat liter and felt extreme pain in his lower back. He also notes that he did planks on Monday. Push ups have flared up back pain in the past. Described as severe aching, radiating to L leg and gluteal region Worsened with movement, sit-to-stand, walking Improved with rest Additional associated symptoms include:  He denies groin pain and neck pain.  At this time symptoms are improving compared to onset, pain is a little less severe and doesn't extend as far.  He has been taking Diazepam at bedtime to help with sleep. Rx was originally for back pain after spinal surgery. He has tried taking Tylenol with some relief. He has been using heat with some relief.   06/07/17: Compared to the last office visit, his previously described symptoms show no change.  Current symptoms are moderate/severe & are radiating to RT side. He denies radiating pain into legs.  He tried a steroid dose pack and did get some relief while on the medication. He has tried taking Tylenol and Diazepam with some relief.   06/21/17: Compared to the last office visit, his previously described symptoms are worsening, having left-sided lower back pain. He was standing and rotated at the hips Saturday and has had severe pain since then.  LBP is constant. He feels like pain is related to abnormal gait.  Current symptoms are moderate/severe & are  radiating to L gluteal region.  He has been taking Tylenol prn.   07/02/2017: Compared to the last office visit, his previously described symptoms show no change Current symptoms are moderate & are radiating to L gluteal region He has been taking Tylenol prn with minimal relief.   08/26/2017: Compared to the last office visit on 07/02/17, his previously described low back pain symptoms are improved from previously but is still in pain w/ walking and standing.  He is walking x 30 min/day for exercise purposes.  He states that his R hand is "really bad" right now and cannot fully close/flex his R ring finger. Current symptoms are moderate & are radiating to L distal thigh (pain and N/T) He has been taking Tylenol prn for pain.  Pt saw Dr. Ernestina [name] on 07/19/17 and had an L4-5 epidural.  Had an L-spine XR on 05/27/17 and an MRI 07/01/17  ROS Denies night time disturbances. Denies fevers, chills, or night sweats. Denies unexplained weight loss. Reports personal history of cancer. Denies changes in bowel or bladder habits. Denies recent unreported falls. Denies new or worsening dyspnea or wheezing. Denies headaches or dizziness.  Reports numbness, tingling or weakness  In the extremities - in the L LE Denies dizziness or presyncopal episodes Denies lower extremity edema    HISTORY & PERTINENT PRIOR DATA:  Prior History reviewed and updated per electronic medical record.  Significant/pertinent history, findings, studies include:  reports that he quit smoking  about 25 years ago. His smoking use included cigarettes. He has a 35.00 pack-year smoking history. He has never used smokeless tobacco. No results for input(s): HGBA1C, LABURIC, CREATINE in the last 8760 hours. No specialty comments available. No problems updated.  OBJECTIVE:  VS:  HT:5\' 6"  (167.6 cm)   WT:216 lb 3.2 oz (98.1 kg)  BMI:34.91    BP:120/76  HR:82bpm  TEMP: ( )  RESP:94 %   PHYSICAL EXAM: Constitutional: WDWN,  Non-toxic appearing. Psychiatric: Alert & appropriately interactive.  Not depressed or anxious appearing. Respiratory: No increased work of breathing.  Trachea Midline Eyes: Pupils are equal.  EOM intact without nystagmus.  No scleral icterus  Vascular Exam: warm to touch no edema  upper and lower extremity neuro exam: unremarkable  MSK Exam: Right hand has pain with palpation of the A1 pulley and triggering appreciated with flexion extension.  He has pain with straight leg raise but this is mild.  He is able to heel and toe walk with only minimal difficulty.   ASSESSMENT & PLAN:   1. Right hand pain   2. Weakness of left leg   3. Trigger ring finger of right hand     PLAN: Ultrasound-guided injection of the A1 pulley today of his right ring finger  Continue with home therapeutic exercises.  Follow-up: Return in about 6 weeks (around 10/07/2017).      Please see additional documentation for Objective, Assessment and Plan sections. Pertinent additional documentation may be included in corresponding procedure notes, imaging studies, problem based documentation and patient instructions. Please see these sections of the encounter for additional information regarding this visit.  CMA/ATC served as Education administrator during this visit. History, Physical, and Plan performed by medical provider. Documentation and orders reviewed and attested to.      Joshua [name], [facility] Physician

## 2017-08-27 ENCOUNTER — Telehealth (INDEPENDENT_AMBULATORY_CARE_PROVIDER_SITE_OTHER): Payer: Self-pay | Admitting: Physical Medicine and Rehabilitation

## 2017-08-27 NOTE — Telephone Encounter (Signed)
Scheduled for 09/27/17 and 10/11/17 with driver.

## 2017-08-27 NOTE — Telephone Encounter (Signed)
Left L4 and L5 TF esi followed in 2 to 3 weeks with Left L5-S1 facet block if needed, sent to [facility] just fyi

## 2017-09-06 ENCOUNTER — Encounter: Payer: Self-pay | Admitting: Sports Medicine

## 2017-09-06 ENCOUNTER — Ambulatory Visit (INDEPENDENT_AMBULATORY_CARE_PROVIDER_SITE_OTHER): Payer: PPO | Admitting: Sports Medicine

## 2017-09-06 VITALS — BP 124/70 | HR 73 | Ht 66.0 in | Wt 218.4 lb

## 2017-09-06 DIAGNOSIS — M545 Low back pain, unspecified: Secondary | ICD-10-CM

## 2017-09-06 DIAGNOSIS — N183 Chronic kidney disease, stage 3 unspecified: Secondary | ICD-10-CM

## 2017-09-06 DIAGNOSIS — J3089 Other allergic rhinitis: Secondary | ICD-10-CM | POA: Diagnosis not present

## 2017-09-06 DIAGNOSIS — M47816 Spondylosis without myelopathy or radiculopathy, lumbar region: Secondary | ICD-10-CM | POA: Diagnosis not present

## 2017-09-06 DIAGNOSIS — J301 Allergic rhinitis due to pollen: Secondary | ICD-10-CM | POA: Diagnosis not present

## 2017-09-06 DIAGNOSIS — R29898 Other symptoms and signs involving the musculoskeletal system: Secondary | ICD-10-CM | POA: Diagnosis not present

## 2017-09-06 DIAGNOSIS — J3081 Allergic rhinitis due to animal (cat) (dog) hair and dander: Secondary | ICD-10-CM | POA: Diagnosis not present

## 2017-09-06 MED ORDER — METHYLPREDNISOLONE 4 MG PO TBPK: ORAL_TABLET | ORAL | 0 refills | Status: DC

## 2017-09-06 NOTE — Progress Notes (Signed)
Joshua [name]. [name], [place] at [place]  NAFIS FARNAN - 77 y.o. male MRN [id number]  Date of birth: 1940-06-29  Visit Date: 09/06/2017  PCP: [name], MD   Referred by: [name], MD  Scribe(s) for today's visit: [name], CMA  SUBJECTIVE:  Joshua [name] is here for Follow-up (back pain)   05/27/17: His thoracic and lumbar pain symptoms INITIALLY: Began a week prior to presentation after playing pickle ball. He felt pain on the R side of his back. A few days later he lifted a heavy bag a cat liter and felt extreme pain in his lower back. He also notes that he did planks on Monday. Push ups have flared up back pain in the past. Described as severe aching, radiating to L leg and gluteal region Worsened with movement, sit-to-stand, walking Improved with rest Additional associated symptoms include:  He denies groin pain and neck pain.  At this time symptoms are improving compared to onset, pain is a little less severe and doesn't extend as far.  He has been taking Diazepam at bedtime to help with sleep. Rx was originally for back pain after spinal surgery. He has tried taking Tylenol with some relief. He has been using heat with some relief.   06/07/17: Compared to the last office visit, his previously described symptoms show no change.  Current symptoms are moderate/severe & are radiating to RT side. He denies radiating pain into legs.  He tried a steroid dose pack and did get some relief while on the medication. He has tried taking Tylenol and Diazepam with some relief.   06/21/17: Compared to the last office visit, his previously described symptoms are worsening, having left-sided lower back pain. He was standing and rotated at the hips Saturday and has had severe pain since then.  LBP is constant. He feels like pain is related to abnormal gait.  Current symptoms are moderate/severe & are radiating  to L gluteal region.  He has been taking Tylenol prn.   07/02/2017: Compared to the last office visit, his previously described symptoms show no change Current symptoms are moderate & are radiating to L gluteal region He has been taking Tylenol prn with minimal relief.   08/26/2017: Compared to the last office visit on 07/02/17, his previously described low back pain symptoms are improved from previously but is still in pain w/ walking and standing.  He is walking x 30 min/day for exercise purposes.  He states that his R hand is "really bad" right now and cannot fully close/flex his R ring finger. Current symptoms are moderate & are radiating to L distal thigh (pain and N/T) He has been taking Tylenol prn for pain.  Pt saw Dr. [name] on 07/19/17 and had an L4-5 epidural. Had an L-spine XR on 05/27/17 and an MRI 07/01/17  09/06/2017: Compared to the last office visit, his previously described symptoms are worsening, midline LBP. L leg pain has improved since he last saw Dr. [name].  Current symptoms are moderate-severe & are radiating to the hips. 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  He has tried taking Tylenol in the past prn for the pain.    REVIEW OF SYSTEMS: Reports night time disturbances. Denies fevers, chills, or night sweats. Denies unexplained weight loss. Reports personal history of cancer. Denies changes in bowel or bladder habits. Denies recent unreported falls. Denies new or worsening dyspnea or wheezing. Denies  headaches or dizziness.  Reports numbness, tingling or weakness  In the extremities.  Denies dizziness or presyncopal episodes Reports lower extremity edema    HISTORY & PERTINENT PRIOR DATA:  Prior History reviewed and updated per electronic medical record.  Significant/pertinent history, findings, studies include:  reports that he quit smoking about 25 years ago. His smoking use included cigarettes. He has a 35.00 pack-year smoking history. He has  never used smokeless tobacco. No results for input(s): HGBA1C, LABURIC, CREATINE in the last 8760 hours. No specialty comments available. No problems updated.  OBJECTIVE:  VS:  HT:5\' 6"  (167.6 cm)   WT:218 lb 6.4 oz (99.1 kg)  BMI:35.27    BP:124/70  HR:73bpm  TEMP: ( )  RESP:97 %   PHYSICAL EXAM: Constitutional: WDWN, Non-toxic appearing. Psychiatric: Alert & appropriately interactive.  Not depressed or anxious appearing. Respiratory: No increased work of breathing.  Trachea Midline Eyes: Pupils are equal.  EOM intact without nystagmus.  No scleral icterus  Vascular Exam: warm to touch no edema  lower extremity neuro exam: unremarkable normal strength normal sensation  MSK Exam: Patient is negative straight leg raise today.  He does have pain with palpation of the lumbosacral junction left worse than right but pain is positive on both.  Worse with leftward side bending.  No focal midline pain.  He is able to heel and toe walk but has difficulty with balance.  His lower extremity sensation is otherwise 5 out of 5 with manual muscle testing.   ASSESSMENT & PLAN:   1. CKD (chronic kidney disease), stage III ([place])   2. Lumbar spondylosis   3. Acute right-sided low back pain without sciatica   4. Weakness of left leg     PLAN: He is Artie set up for epidural steroid injections beginning of July however his pain is progressively worsened to the point that he is having hard time ambulating due to localized back pain.  He did respond to prior injection as well but given the generalized entire low back pain systemic Medrol Dosepak would likely benefit more at this point with targeted epidural steroid injections as previously scheduled.  If he is having any worsening symptoms or lack of improvement with Medrol can consider moving his epidural steroid injections up if possible but I will have him coordinate this with Dr. Romona Curls office.  Believe this is a large majority of facet  mediated pain given the overall improvement in his left leg pain but agree that the transforaminal injections would likely provide him more benefit than facet blocks or facet injections at this point.  Follow-up: As previously scheduled in July.    Please see additional documentation for Objective, Assessment and Plan sections. Pertinent additional documentation may be included in corresponding procedure notes, imaging studies, problem based documentation and patient instructions. Please see these sections of the encounter for additional information regarding this visit.  CMA/ATC served as Education administrator during this visit. History, Physical, and Plan performed by medical provider. Documentation and orders reviewed and attested to.      Gerda Diss, [facility] Physician

## 2017-09-08 ENCOUNTER — Encounter: Payer: Self-pay | Admitting: Sports Medicine

## 2017-09-13 ENCOUNTER — Ambulatory Visit (INDEPENDENT_AMBULATORY_CARE_PROVIDER_SITE_OTHER): Payer: PPO | Admitting: Physical Medicine and Rehabilitation

## 2017-09-13 ENCOUNTER — Encounter

## 2017-09-13 ENCOUNTER — Encounter (INDEPENDENT_AMBULATORY_CARE_PROVIDER_SITE_OTHER): Payer: Self-pay | Admitting: Physical Medicine and Rehabilitation

## 2017-09-13 ENCOUNTER — Ambulatory Visit (INDEPENDENT_AMBULATORY_CARE_PROVIDER_SITE_OTHER): Payer: Self-pay

## 2017-09-13 VITALS — BP 117/62 | HR 90

## 2017-09-13 DIAGNOSIS — G609 Hereditary and idiopathic neuropathy, unspecified: Secondary | ICD-10-CM

## 2017-09-13 DIAGNOSIS — M47816 Spondylosis without myelopathy or radiculopathy, lumbar region: Secondary | ICD-10-CM

## 2017-09-13 MED ORDER — BUPIVACAINE HCL 0.5 % IJ SOLN
3.0000 mL | Freq: Once | INTRAMUSCULAR | Status: AC
  Administered 2017-09-13: 3 mL

## 2017-09-13 MED ORDER — BETAMETHASONE SOD PHOS & ACET 6 (3-3) MG/ML IJ SUSP
12.0000 mg | Freq: Once | INTRAMUSCULAR | Status: AC
  Administered 2017-09-13: 12 mg

## 2017-09-13 NOTE — Progress Notes (Signed)
 .  Numeric Pain Rating Scale and Functional Assessment Average Pain 6   In the last MONTH (on 0-10 scale) has pain interfered with the following?  1. General activity like being  able to carry out your everyday physical activities such as walking, climbing stairs, carrying groceries, or moving a chair?  Rating(1)   +Driver, -BT, -Dye Allergies.

## 2017-09-13 NOTE — Patient Instructions (Signed)

## 2017-09-14 ENCOUNTER — Encounter: Payer: Self-pay | Admitting: Sports Medicine

## 2017-09-16 NOTE — Procedures (Signed)
Lumbar Diagnostic Facet Joint Nerve Block with Fluoroscopic Guidance   Patient: Joshua [name]      Date of Birth: [birth year] MRN: [id number] PCP: [name], MD      Visit Date: 09/13/2017   Universal Protocol:    Date/Time: 06/27/195:03 AM  Consent Given By: the patient  Position: PRONE  Additional Comments: Vital signs were monitored before and after the procedure. Patient was prepped and draped in the usual sterile fashion. The correct patient, procedure, and site was verified.   Injection Procedure Details:  Procedure Site One Meds Administered:  Meds ordered this encounter  Medications  . betamethasone acetate-betamethasone sodium phosphate (CELESTONE) injection 12 mg  . bupivacaine (MARCAINE) 0.5 % (with pres) injection 3 mL     Laterality: Bilateral  Location/Site:  L4-L5 L5-S1  Needle size: 22 ga.  Needle type:spinal  Needle Placement: Oblique pedical  Findings:   -Comments: There was excellent flow of contrast along the articular pillars without intravascular flow.  Procedure Details: The fluoroscope beam is vertically oriented in AP and then obliqued 15 to 20 degrees to the ipsilateral side of the desired nerve to achieve the "Scotty dog" appearance.  The skin over the target area of the junction of the superior articulating process and the transverse process (sacral ala if blocking the L5 dorsal rami) was locally anesthetized with a 1 ml volume of 1% Lidocaine without Epinephrine.  The spinal needle was inserted and advanced in a trajectory view down to the target.   After contact with periosteum and negative aspirate for blood and CSF, correct placement without intravascular or epidural spread was confirmed by injecting 0.5 ml. of Isovue-250.  A spot radiograph was obtained of this image.    Next, a 0.5 ml. volume of the injectate described above was injected. The needle was then redirected to the other facet joint nerves mentioned above if  needed.  Prior to the procedure, the patient was given a Pain Diary which was completed for baseline measurements.  After the procedure, the patient rated their pain every 30 minutes and will continue rating at this frequency for a total of 5 hours.  The patient has been asked to complete the Diary and return to [place] by mail, fax or hand delivered as soon as possible.   Additional Comments:  The patient tolerated the procedure well Dressing: Band-Aid    Post-procedure details: Patient was observed during the procedure. Post-procedure instructions were reviewed.  Patient left the clinic in stable condition.

## 2017-09-16 NOTE — Progress Notes (Signed)
Joshua [name] - 77 y.o. male MRN [id number]  Date of birth: 17-Mar-1941  Office Visit Note: Visit Date: 09/13/2017 PCP: [name], MD Referred by: [name], MD  Subjective: Chief Complaint  Patient presents with  . Lower Back - Pain   HPI: Joshua [name] is a 77 year old gentleman who is accompanied by his wife who provides the history.  We recently saw him a couple of months ago at the request of Dr. Teresa Coombs and ended up completing a left L4 transforaminal epidural steroid injection.  He reports that his left buttock pain and some of the leg pain he was having which did not really go past the knee has actually improved quite a bit but he still having a lot of axial low back pain.  This is worse with standing and ambulating.  He does have a level of stenosis with listhesis of L4 on L5 but with significant facet arthropathy.  Reviewing his case today looking Dr. Nicolasa Ducking notes I do feel like the pain he is having probably is facet joint mediated pain.  This is given the fact that the epidural injection did help his back pain is much as it did the buttock pain.  He has had a recent steroid Dosepak by Dr. Paulla Fore.  I think the best approach is diagnostic medial branch blocks.  The patient does not do well with injections in general but does okay.  I think he would do well with medial branch blocks today with no steroid medication and pain diary and if he does well with that I would complete a second block and a double block paradigm and look at radiofrequency ablation.  We talked about this at length.  We will proceed today.   ROS Otherwise per HPI.  Assessment & Plan: Visit Diagnoses:  1. Spondylosis without myelopathy or radiculopathy, lumbar region   2. Hereditary and idiopathic peripheral neuropathy     Plan: No additional findings.   Meds & Orders:  Meds ordered this encounter  Medications  . betamethasone acetate-betamethasone sodium phosphate (CELESTONE) injection  12 mg  . bupivacaine (MARCAINE) 0.5 % (with pres) injection 3 mL    Orders Placed This Encounter  Procedures  . Facet Injection  . XR C-ARM NO REPORT    Follow-up: Return if symptoms worsen or fail to improve, for Dr. Teresa Coombs.   Procedures: No procedures performed  Lumbar Diagnostic Facet Joint Nerve Block with Fluoroscopic Guidance   Patient: Joshua [name]      Date of Birth: [birth year] MRN: [id number] PCP: [name], MD      Visit Date: 09/13/2017   Universal Protocol:    Date/Time: 06/27/195:03 AM  Consent Given By: the patient  Position: PRONE  Additional Comments: Vital signs were monitored before and after the procedure. Patient was prepped and draped in the usual sterile fashion. The correct patient, procedure, and site was verified.   Injection Procedure Details:  Procedure Site One Meds Administered:  Meds ordered this encounter  Medications  . betamethasone acetate-betamethasone sodium phosphate (CELESTONE) injection 12 mg  . bupivacaine (MARCAINE) 0.5 % (with pres) injection 3 mL     Laterality: Bilateral  Location/Site:  L4-L5 L5-S1  Needle size: 22 ga.  Needle type:spinal  Needle Placement: Oblique pedical  Findings:   -Comments: There was excellent flow of contrast along the articular pillars without intravascular flow.  Procedure Details: The fluoroscope beam is vertically oriented in AP and then obliqued 15 to 20 degrees  to the ipsilateral side of the desired nerve to achieve the "Scotty dog" appearance.  The skin over the target area of the junction of the superior articulating process and the transverse process (sacral ala if blocking the L5 dorsal rami) was locally anesthetized with a 1 ml volume of 1% Lidocaine without Epinephrine.  The spinal needle was inserted and advanced in a trajectory view down to the target.   After contact with periosteum and negative aspirate for blood and CSF, correct placement without  intravascular or epidural spread was confirmed by injecting 0.5 ml. of Isovue-250.  A spot radiograph was obtained of this image.    Next, a 0.5 ml. volume of the injectate described above was injected. The needle was then redirected to the other facet joint nerves mentioned above if needed.  Prior to the procedure, the patient was given a Pain Diary which was completed for baseline measurements.  After the procedure, the patient rated their pain every 30 minutes and will continue rating at this frequency for a total of 5 hours.  The patient has been asked to complete the Diary and return to [place] by mail, fax or hand delivered as soon as possible.   Additional Comments:  The patient tolerated the procedure well Dressing: Band-Aid    Post-procedure details: Patient was observed during the procedure. Post-procedure instructions were reviewed.  Patient left the clinic in stable condition.   Clinical History: MRI LUMBAR SPINE WITHOUT CONTRAST  TECHNIQUE: Multiplanar, multisequence MR imaging of the lumbar spine was performed. No intravenous contrast was administered.  COMPARISON:  Lumbar radiography 05/27/2017  FINDINGS: Segmentation: 5 non-rib-bearing vertebrae on comparison radiography.  Alignment:  Mild levoscoliosis.  Grade 1 anterolisthesis at L4-5.  Vertebrae: Degenerative appearing marrow edema about the left L5-S1 facet. No evidence of fracture, discitis, or aggressive bone lesion.  Conus medullaris and cauda equina: Conus extends to the L1-2 level. Conus and cauda equina appear normal.  Paraspinal and other soft tissues: Small bilateral renal cystic intensities.  Disc levels:  T12- L1: Spondylosis.  No impingement.  L1-L2: Spondylosis with disc narrowing and bulging. Mild facet spurring. No impingement.  L2-L3: Degenerative posterior element hypertrophy.  No impingement  L3-L4: Degenerative posterior element hypertrophy.  No impingement  L4-L5:  Severe facet degeneration with bulky hypertrophy and joint distortion. Small bilateral joint effusion. There is grade 1 anterolisthesis. The disc is narrowed and desiccated with mild bulging. Advanced spinal stenosis. Right more than left moderate foraminal narrowing.  L5-S1:Severe degenerative facet arthropathy with active facet arthritis on the left. The joints are markedly spurred and hypertrophic with bilateral joint effusion. Mild spinal stenosis. Hypertrophy could affect the descending left S1 nerve root. Moderate left and mild right foraminal narrowing.  IMPRESSION: 1. 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 2. L4-5 advanced spinal stenosis. Moderate right more than left foraminal narrowing. 3. L5-S1 moderate left and mild right foraminal narrowing. Left subarticular recess narrowing that could affect the left S1 nerve root. 4. Mild levoscoliosis.   Electronically Signed   By: Monte Fantasia M.D.   On: 07/01/2017 10:16   He reports that he quit smoking about 25 years ago. His smoking use included cigarettes. He has a 35.00 pack-year smoking history. He has never used smokeless tobacco. No results for input(s): HGBA1C, LABURIC in the last 8760 hours.  Objective:  VS:  HT:    WT:   BMI:  BP:117/62  HR:90bpm  TEMP: ( )  RESP:  Physical Exam  Ortho Exam Imaging: No results found.  Past Medical/Family/Surgical/Social History: Medications & Allergies reviewed per EMR, new medications updated. Patient Active Problem List   Diagnosis Date Noted  . Weakness of left leg 06/21/2017  . DJD (degenerative joint disease)   . Arthritis   . Neck pain 02/08/2017  . Venous insufficiency 12/29/2016  . Ganglion cyst of tendon sheath of right hand 12/29/2016  . Avulsion fracture of metatarsal bone of right foot 11/25/2016  . Gout 11/20/2016  . Obesity, Class I, BMI 30-34.9  10/03/2015  . Hereditary and idiopathic peripheral neuropathy 09/25/2014  . Allergic rhinitis 05/31/2014  . Fatigue 11/09/2013  . CKD (chronic kidney disease), stage III ([place]) 10/23/2013  . Dizziness 08/23/2013  . Neuropathy ([place]) 08/21/2013  . RLS (restless legs syndrome) 01/03/2013  . Low back pain radiating to left leg 10/09/2011  . History of bladder cancer 04/01/2010  . OSTEOARTHRITIS, LOWER LEG, LEFT 10/04/2009  . TOBACCO ABUSE, HX OF 12/26/2008  . ANEMIA, IRON DEFICIENCY 11/06/2008  . 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 Essential hypertension 02/21/2007  . OSA (obstructive sleep apnea) 11/02/2006  . Hyperlipidemia 10/12/2006  . DEPRESSION 10/12/2006  . Coronary atherosclerosis 10/12/2006  . Asthma 10/12/2006  . GERD 10/12/2006  . Irritable bowel syndrome 10/12/2006   Past Medical History:  Diagnosis Date  . Acute medial meniscal tear   . Anemia   . Arthritis    "middle finger right hand; right knee; neck" (02/06/2014)  . Asthma   . Bladder cancer ([place]) 04/2010   "cauterized during prostate OR"  . CAD (coronary artery disease)    CABG 2001  . Depression   . Diverticulosis   . DJD (degenerative joint disease)   . GERD (gastroesophageal reflux disease)   . History of blood transfusion 2001   "related to the bypass OR"  . History of blood transfusion 03/24/1999   "related to the bypass OR"  . History of gout   . Hyperlipidemia   . Hypertension   . IBS (irritable bowel syndrome)   . Obesity   . OSA (obstructive sleep apnea)   . Pancreatitis ~ 1980  . Pneumonia 1980's X 2  . Prostate cancer ([place]) 04/2010   Family History  Problem Relation Age of Onset  . Heart disease Mother   . Hyperlipidemia Mother   . Heart disease Father   . Hyperlipidemia Father   . Diabetes Brother    Past Surgical History:  Procedure Laterality Date  . ANTERIOR CERVICAL DECOMP/DISCECTOMY FUSION  05/26/06  . BACK SURGERY    . CARDIAC CATHETERIZATION  11/20/1999  . CORONARY  ARTERY BYPASS GRAFT  11/20/1999   SVG-RI1-RI2, SVG-OM, SVG-dRCA  . KNEE ARTHROSCOPY Right 1992; 04/2008  . LAPAROSCOPIC CHOLECYSTECTOMY  2007  . NASAL SEPTUM SURGERY Right 2007  . ROBOT ASSISTED LAPAROSCOPIC RADICAL PROSTATECTOMY  04/2010  . THYROIDECTOMY, PARTIAL  1980   Social History   Occupational History  . Occupation: retired    Fish farm manager: RETIRED  Tobacco Use  . Smoking status: Former Smoker    Packs/day: 1.00    Years: 35.00    Pack years: 35.00    Types: Cigarettes    Last attempt to quit: 06/16/1992    Years since quitting: 25.2  . Smokeless tobacco: Never Used  . Tobacco comment: discussed the AAA but thinks he may have had this   Substance and Sexual Activity  . Alcohol use: Yes    Alcohol/week:  2.4 oz    Types: 4 Shots of liquor per week    Comment: mixed drink  . Drug use: No  . Sexual activity: Never

## 2017-09-21 DIAGNOSIS — J301 Allergic rhinitis due to pollen: Secondary | ICD-10-CM | POA: Diagnosis not present

## 2017-09-21 DIAGNOSIS — J3081 Allergic rhinitis due to animal (cat) (dog) hair and dander: Secondary | ICD-10-CM | POA: Diagnosis not present

## 2017-09-21 DIAGNOSIS — J3089 Other allergic rhinitis: Secondary | ICD-10-CM | POA: Diagnosis not present

## 2017-09-27 ENCOUNTER — Ambulatory Visit (INDEPENDENT_AMBULATORY_CARE_PROVIDER_SITE_OTHER): Payer: Self-pay

## 2017-09-27 ENCOUNTER — Encounter (INDEPENDENT_AMBULATORY_CARE_PROVIDER_SITE_OTHER): Payer: PPO | Admitting: Physical Medicine and Rehabilitation

## 2017-09-27 ENCOUNTER — Ambulatory Visit (INDEPENDENT_AMBULATORY_CARE_PROVIDER_SITE_OTHER): Payer: PPO | Admitting: Physical Medicine and Rehabilitation

## 2017-09-27 ENCOUNTER — Encounter (INDEPENDENT_AMBULATORY_CARE_PROVIDER_SITE_OTHER): Payer: Self-pay | Admitting: Physical Medicine and Rehabilitation

## 2017-09-27 VITALS — BP 127/80 | HR 95

## 2017-09-27 DIAGNOSIS — G4733 Obstructive sleep apnea (adult) (pediatric): Secondary | ICD-10-CM | POA: Diagnosis not present

## 2017-09-27 DIAGNOSIS — M47816 Spondylosis without myelopathy or radiculopathy, lumbar region: Secondary | ICD-10-CM | POA: Diagnosis not present

## 2017-09-27 DIAGNOSIS — M5416 Radiculopathy, lumbar region: Secondary | ICD-10-CM

## 2017-09-27 DIAGNOSIS — M48062 Spinal stenosis, lumbar region with neurogenic claudication: Secondary | ICD-10-CM | POA: Diagnosis not present

## 2017-09-27 MED ORDER — BETAMETHASONE SOD PHOS & ACET 6 (3-3) MG/ML IJ SUSP
12.0000 mg | Freq: Once | INTRAMUSCULAR | Status: AC
  Administered 2017-09-27: 12 mg

## 2017-09-27 NOTE — Patient Instructions (Signed)

## 2017-09-27 NOTE — Progress Notes (Signed)
 .  Numeric Pain Rating Scale and Functional Assessment Average Pain 9   In the last MONTH (on 0-10 scale) has pain interfered with the following?  1. General activity like being  able to carry out your everyday physical activities such as walking, climbing stairs, carrying groceries, or moving a chair?  Rating(4)   +Driver, -BT, -Dye Allergies.  

## 2017-09-28 ENCOUNTER — Other Ambulatory Visit: Payer: Self-pay | Admitting: Family Medicine

## 2017-10-01 ENCOUNTER — Ambulatory Visit (INDEPENDENT_AMBULATORY_CARE_PROVIDER_SITE_OTHER): Payer: PPO | Admitting: Family Medicine

## 2017-10-01 ENCOUNTER — Ambulatory Visit: Payer: Self-pay

## 2017-10-01 ENCOUNTER — Encounter: Payer: Self-pay | Admitting: Family Medicine

## 2017-10-01 VITALS — BP 126/76 | HR 75 | Temp 97.9°F | Ht 66.0 in | Wt 217.8 lb

## 2017-10-01 DIAGNOSIS — I10 Essential (primary) hypertension: Secondary | ICD-10-CM

## 2017-10-01 DIAGNOSIS — Z8546 Personal history of malignant neoplasm of prostate: Secondary | ICD-10-CM | POA: Diagnosis not present

## 2017-10-01 DIAGNOSIS — F3342 Major depressive disorder, recurrent, in full remission: Secondary | ICD-10-CM

## 2017-10-01 DIAGNOSIS — R739 Hyperglycemia, unspecified: Secondary | ICD-10-CM

## 2017-10-01 DIAGNOSIS — R5383 Other fatigue: Secondary | ICD-10-CM | POA: Diagnosis not present

## 2017-10-01 LAB — POC URINALSYSI DIPSTICK (AUTOMATED)
Bilirubin, UA: NEGATIVE
Blood, UA: NEGATIVE
Glucose, UA: NEGATIVE
Ketones, UA: NEGATIVE
Leukocytes, UA: NEGATIVE
Nitrite, UA: NEGATIVE
Protein, UA: NEGATIVE
Spec Grav, UA: 1.025 (ref 1.010–1.025)
Urobilinogen, UA: 0.2 E.U./dL
pH, UA: 6 (ref 5.0–8.0)

## 2017-10-01 NOTE — Telephone Encounter (Signed)
Noted  

## 2017-10-01 NOTE — Patient Instructions (Addendum)
Please stop by lab before you go  Glad we have visit next week for follow up. Please see [place] sooner for new or worsening symptoms.

## 2017-10-01 NOTE — Progress Notes (Signed)
Subjective:  Joshua [name] is a 77 y.o. year old very pleasant male patient who presents for/with See problem oriented charting ROS- complains of fatigue and nausea. No shortness of breath. No fever or chills. No vomiting. Poor appetite.    Past Medical History-  Patient Active Problem List   Diagnosis Date Noted  . Coronary atherosclerosis 10/12/2006    Priority: High  . Irritable bowel syndrome 10/12/2006    Priority: High  . Gout 11/20/2016    Priority: Medium  . CKD (chronic kidney disease), stage III ([place]) 10/23/2013    Priority: Medium  . Neuropathy ([place]) 08/21/2013    Priority: Medium  . History of bladder cancer 04/01/2010    Priority: Medium  . Essential hypertension 02/21/2007    Priority: Medium  . Hyperlipidemia 10/12/2006    Priority: Medium  . Major depression in full remission ([place]) 10/12/2006    Priority: Medium  . Asthma 10/12/2006    Priority: Medium  . DJD (degenerative joint disease)     Priority: Low  . Arthritis     Priority: Low  . Neck pain 02/08/2017    Priority: Low  . Venous insufficiency 12/29/2016    Priority: Low  . Ganglion cyst of tendon sheath of right hand 12/29/2016    Priority: Low  . Avulsion fracture of metatarsal bone of right foot 11/25/2016    Priority: Low  . Obesity, Class I, BMI 30-34.9 10/03/2015    Priority: Low  . Allergic rhinitis 05/31/2014    Priority: Low  . Fatigue 11/09/2013    Priority: Low  . Dizziness 08/23/2013    Priority: Low  . RLS (restless legs syndrome) 01/03/2013    Priority: Low  . Low back pain radiating to left leg 10/09/2011    Priority: Low  . OSTEOARTHRITIS, LOWER LEG, LEFT 10/04/2009    Priority: Low  . TOBACCO ABUSE, HX OF 12/26/2008    Priority: Low  . ANEMIA, IRON DEFICIENCY 11/06/2008    Priority: Low  . 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 OSA (obstructive sleep apnea) 11/02/2006    Priority: Low  . GERD 10/12/2006    Priority: Low  . Weakness of left  leg 06/21/2017  . Hereditary and idiopathic peripheral neuropathy 09/25/2014    Medications- reviewed and updated Current Outpatient Medications  Medication Sig Dispense Refill  . acetaminophen (TYLENOL) 500 MG tablet Take 1,000 mg by mouth every 6 (six) hours as needed for headache (pain). Reported on 07/02/2015    . albuterol (PROAIR HFA) 108 (90 BASE) MCG/ACT inhaler Inhale 1-2 puffs into the lungs every 6 (six) hours as needed for wheezing or shortness of breath. 18 g 3  . aspirin EC 81 MG tablet Take 81 mg by mouth daily.    . Cyanocobalamin (VITAMIN B 12 PO) Take 800 mg by mouth daily.    [name] desloratadine (CLARINEX) 5 MG tablet Take 5 mg by mouth at bedtime.     . diazepam (VALIUM) 5 MG tablet Take 1 tablet (5 mg total) by mouth at bedtime as needed. 30 tablet 1  . Evolocumab (REPATHA SURECLICK) 254 MG/ML SOAJ Inject 140 mg into the skin every 14 (fourteen) days. 2 pen 11  . FLOVENT HFA 110 MCG/ACT inhaler Inhale 2 puffs into the lungs 2 (two) times daily. Reported on 03/22/2015  5  . fluticasone (FLONASE) 50 MCG/ACT nasal spray Place 1 spray into both nostrils 2 (two) times daily.     . folic acid (FOLVITE) 270  MCG tablet Take 400 mcg by mouth daily.    [name] gabapentin (NEURONTIN) 100 MG capsule Take 1 cap three times a day, 4 caps at bedtime 630 capsule 3  . hydrochlorothiazide (HYDRODIURIL) 25 MG tablet Take 1 tablet (25 mg total) by mouth daily. 90 tablet 3  . irbesartan (AVAPRO) 300 MG tablet TAKE 1 TABLET BY MOUTH EVERY DAY 90 tablet 1  . ketoconazole (NIZORAL) 2 % cream Apply topically 2 (two) times daily.    . methylPREDNISolone (MEDROL DOSEPAK) 4 MG TBPK tablet Take by mouth as directed. Take 6 tablets on the first day prescribed then as directed. 21 tablet 0  . NON FORMULARY     . nystatin (MYCOSTATIN/NYSTOP) powder Apply topically 4 (four) times daily. 15 g 0  . omeprazole (PRILOSEC) 20 MG capsule Take 1 capsule (20 mg total) by mouth daily. 30 capsule 3  . ondansetron (ZOFRAN  ODT) 8 MG disintegrating tablet Take 1 tablet (8 mg total) by mouth every 8 (eight) hours as needed for nausea or vomiting. 20 tablet 0  . polyethylene glycol powder (GLYCOLAX/MIRALAX) powder Take 17 g by mouth daily. 255 g 0  . Probiotic Product (ALIGN) 4 MG CAPS Take 4 mg by mouth daily.     . simvastatin (ZOCOR) 20 MG tablet Take 1 tablet (20 mg total) by mouth at bedtime. 90 tablet 3   No current facility-administered medications for this visit.     Objective: BP 126/76 (BP Location: Left Arm, Patient Position: Sitting, Cuff Size: Normal)   Pulse 75   Temp 97.9 F (36.6 C) (Oral)   Ht 5\' 6"  (1.676 m)   Wt 217 lb 12.8 oz (98.8 kg)   SpO2 95%   BMI 35.15 kg/m  Gen: NAD, resting comfortably Tm normal, oropharynx normal, PERRLA CV: RRR no murmurs rubs or gallops Lungs: CTAB no crackles, wheeze, rhonchi Abdomen: soft/nontender/nondistended/normal bowel sounds. obese Ext: no edema Skin: warm, dry  EKG: sinus rhythm with rate of 72, low voltage in precordial leads, left axis, normal intervals, no hypertrophy. Isolated q wave in v1, unchanged from prior  Assessment/Plan:  Fatigue/nausea S: had steroid shot on Monday for his back. Feels out of it since then, feels dizzy, very tired, very worn down. Sometimes hard to lift up arms. Feels more down than usual. No more tinnitus.  Doesn't sleep well maybe 2-3 hours a night after steroid shots- though slept better last night. Napping more in the daytime. Typically doesn't feel well with steroid shots but this is worse than typical for him. Took repatha on Wednesday.   Starting to sleep better. Wednesday was really bad- today is slightly better.   Took repatha on Wednesday. Baseline diarrhea- on miralax 1 capful twice a day for IBS.   Not hungry, nauseated. Coughed some yellow up yesterday but no regular cough. Not short of breath. Some flushing. No sweating. Feels like needs to take some deep breaths.  A/P: A lot of nonspecific symptoms  here. Patient seems not to tolerate steroid injections and this may simply be from him recovering from recent infection. Did labs for upcoming CPE which were largely reassuring.  swelling worse within 2 days, but has noted some more  Recently in general- we are still pending BNP but has clear lungs and doubt heart failure as cause.   Major depression in full remission ([place]) S: phq9 elevated. Not on medication. Has done counseling in past.  Depression screen [facility] 2/9 10/01/2017  Decreased Interest 3  Down, Depressed, Hopeless 1  PHQ - 2 Score 4  Altered sleeping 1  Tired, decreased energy 1  Change in appetite 1  Feeling bad or failure about yourself  0  Trouble concentrating 0  Moving slowly or fidgety/restless 0  Suicidal thoughts 0  PHQ-9 Score 7  Some recent data might be hidden  A/P: many of patient's symptoms he states are due to recent illness. He denies worsening depression. Can recheck at future visit but sounds like major recurrent depression remais on full remission   Future Appointments  Date Time Provider [place]  10/07/2017  8:30 AM [name], MD [facility]  10/07/2017  9:20 AM Gerda Diss, DO [facility]  01/25/2018  8:30 AM Cameron Sprang, MD [facility] None   Lab/Order associations: Fatigue, unspecified type - Plan: EKG 12-Lead, POCT Urinalysis Dipstick (Automated), Brain natriuretic peptide, TSH, Lipid panel, Comprehensive metabolic panel, CBC with Differential/Platelet, CBC with Differential/Platelet, Comprehensive metabolic panel, Lipid panel, TSH, Brain natriuretic peptide, POCT Urinalysis Dipstick (Automated), CANCELED: CBC with Differential/Platelet, CANCELED: Comprehensive metabolic panel, CANCELED: Lipid panel, CANCELED: TSH, CANCELED: Brain natriuretic peptide  Essential hypertension - Plan: POCT Urinalysis Dipstick (Automated), Lipid panel, Comprehensive metabolic panel, CBC with Differential/Platelet, CBC with Differential/Platelet,  Comprehensive metabolic panel, Lipid panel, POCT Urinalysis Dipstick (Automated), CANCELED: CBC with Differential/Platelet, CANCELED: Comprehensive metabolic panel, CANCELED: Lipid panel  History of prostate cancer - Plan: PSA, PSA, CANCELED: PSA  Hyperglycemia - Plan: Hemoglobin A1c, Hemoglobin A1c, CANCELED: Hemoglobin A1c, CANCELED: Hemoglobin A1c  Recurrent major depressive disorder, in full remission ([place])  Return precautions advised.  [name], MD

## 2017-10-01 NOTE — Telephone Encounter (Signed)
Pt. Reports he saw his "back doctor and he gave me a steroid injection in my back Monday." Reports since then has been tired, "wimpy and out of it" "I was able to do my push ups yesterday." States he is eating and drinking well. No fever, shortness of breath or chest pain. Appointment made for today. Reason for Disposition . [1] MODERATE weakness (i.e., interferes with work, school, normal activities) AND [2] persists > 3 days  Answer Assessment - Initial Assessment Questions 1. DESCRIPTION: "Describe how you are feeling."     Feels "wimpy", tired and "out of it" 2. SEVERITY: "How bad is it?"  "Can you stand and walk?"   - MILD - Feels weak or tired, but does not interfere with work, school or normal activities   - [place] to stand and walk; weakness interferes with work, school, or normal activities   - SEVERE - Unable to stand or walk     Mild 3. ONSET:  "When did the weakness begin?"     Monday after steroid shts 4. CAUSE: "What do you think is causing the weakness?"     Steroid shots 5. MEDICINES: "Have you recently started a new medicine or had a change in the amount of a medicine?"     Steroid injectios 6. OTHER SYMPTOMS: "Do you have any other symptoms?" (e.g., chest pain, fever, cough, SOB, vomiting, diarrhea, bleeding, other areas of pain)     Flushed face  7. PREGNANCY: "Is there any chance you are pregnant?" "When was your last menstrual period?"     N/A  Protocols used: WEAKNESS (GENERALIZED) AND FATIGUE-A-AH

## 2017-10-02 LAB — COMPREHENSIVE METABOLIC PANEL
AG Ratio: 2 (calc) (ref 1.0–2.5)
ALT: 23 U/L (ref 9–46)
AST: 16 U/L (ref 10–35)
Albumin: 4.1 g/dL (ref 3.6–5.1)
Alkaline phosphatase (APISO): 52 U/L (ref 40–115)
BUN/Creatinine Ratio: 13 (calc) (ref 6–22)
BUN: 19 mg/dL (ref 7–25)
CO2: 27 mmol/L (ref 20–32)
Calcium: 9.6 mg/dL (ref 8.6–10.3)
Chloride: 98 mmol/L (ref 98–110)
Creat: 1.46 mg/dL — ABNORMAL HIGH (ref 0.70–1.18)
Globulin: 2.1 g/dL (calc) (ref 1.9–3.7)
Glucose, Bld: 108 mg/dL — ABNORMAL HIGH (ref 65–99)
Potassium: 4.3 mmol/L (ref 3.5–5.3)
Sodium: 135 mmol/L (ref 135–146)
Total Bilirubin: 0.6 mg/dL (ref 0.2–1.2)
Total Protein: 6.2 g/dL (ref 6.1–8.1)

## 2017-10-02 LAB — CBC WITH DIFFERENTIAL/PLATELET
Basophils Absolute: 62 cells/uL (ref 0–200)
Basophils Relative: 0.7 %
Eosinophils Absolute: 229 cells/uL (ref 15–500)
Eosinophils Relative: 2.6 %
HCT: 43.3 % (ref 38.5–50.0)
Hemoglobin: 15.1 g/dL (ref 13.2–17.1)
Lymphs Abs: 1619 cells/uL (ref 850–3900)
MCH: 31.7 pg (ref 27.0–33.0)
MCHC: 34.9 g/dL (ref 32.0–36.0)
MCV: 91 fL (ref 80.0–100.0)
MPV: 9.1 fL (ref 7.5–12.5)
Monocytes Relative: 11 %
Neutro Abs: 5922 cells/uL (ref 1500–7800)
Neutrophils Relative %: 67.3 %
Platelets: 234 10*3/uL (ref 140–400)
RBC: 4.76 10*6/uL (ref 4.20–5.80)
RDW: 12.2 % (ref 11.0–15.0)
Total Lymphocyte: 18.4 %
WBC mixed population: 968 cells/uL — ABNORMAL HIGH (ref 200–950)
WBC: 8.8 10*3/uL (ref 3.8–10.8)

## 2017-10-02 LAB — BRAIN NATRIURETIC PEPTIDE: Brain Natriuretic Peptide: 32 pg/mL (ref <100)

## 2017-10-02 LAB — LIPID PANEL
Cholesterol: 94 mg/dL (ref <200)
HDL: 44 mg/dL (ref >40)
LDL Cholesterol (Calc): 16 mg/dL (calc)
Non-HDL Cholesterol (Calc): 50 mg/dL (calc) (ref <130)
Total CHOL/HDL Ratio: 2.1 (calc) (ref <5.0)
Triglycerides: 322 mg/dL — ABNORMAL HIGH (ref <150)

## 2017-10-02 LAB — HEMOGLOBIN A1C
Hgb A1c MFr Bld: 5.8 % of total Hgb — ABNORMAL HIGH (ref <5.7)
Mean Plasma Glucose: 120 (calc)
eAG (mmol/L): 6.6 (calc)

## 2017-10-02 LAB — PSA: PSA: <0.1 ng/mL (ref <=4.0)

## 2017-10-02 LAB — TSH: TSH: 1.24 mIU/L (ref 0.40–4.50)

## 2017-10-02 NOTE — Assessment & Plan Note (Signed)
S: phq9 elevated. Not on medication. Has done counseling in past.  Depression screen [facility] 2/9 10/01/2017  Decreased Interest 3  Down, Depressed, Hopeless 1  PHQ - 2 Score 4  Altered sleeping 1  Tired, decreased energy 1  Change in appetite 1  Feeling bad or failure about yourself  0  Trouble concentrating 0  Moving slowly or fidgety/restless 0  Suicidal thoughts 0  PHQ-9 Score 7  Some recent data might be hidden  A/P: many of patient's symptoms he states are due to recent illness. He denies worsening depression. Can recheck at future visit but sounds like major recurrent depression remais on full remission

## 2017-10-04 ENCOUNTER — Telehealth (INDEPENDENT_AMBULATORY_CARE_PROVIDER_SITE_OTHER): Payer: Self-pay | Admitting: Physical Medicine and Rehabilitation

## 2017-10-04 DIAGNOSIS — J3081 Allergic rhinitis due to animal (cat) (dog) hair and dander: Secondary | ICD-10-CM | POA: Diagnosis not present

## 2017-10-04 DIAGNOSIS — J301 Allergic rhinitis due to pollen: Secondary | ICD-10-CM | POA: Diagnosis not present

## 2017-10-04 DIAGNOSIS — J3089 Other allergic rhinitis: Secondary | ICD-10-CM | POA: Diagnosis not present

## 2017-10-04 NOTE — Telephone Encounter (Signed)
So, the feeling of ill and depressed could be from the steroid in the injection, if it is this will resolve but he should not have cortizone or prednisone for a couple of months. I saw lab work that Dr. Yong Channel ordered and did not see any worrisome values.  In terms of his current pain he should regroup with Dr. Paulla Fore and see how he would like to proceed but may want to consider surgical consult at least to get information on what can and can't be done.

## 2017-10-04 NOTE — Telephone Encounter (Signed)
Called patient to advise. He will see Dr. Paulla Fore this week and will discuss.

## 2017-10-07 ENCOUNTER — Encounter: Payer: Self-pay | Admitting: Family Medicine

## 2017-10-07 ENCOUNTER — Ambulatory Visit (INDEPENDENT_AMBULATORY_CARE_PROVIDER_SITE_OTHER): Payer: PPO | Admitting: Family Medicine

## 2017-10-07 ENCOUNTER — Encounter: Payer: Self-pay | Admitting: Sports Medicine

## 2017-10-07 ENCOUNTER — Ambulatory Visit (INDEPENDENT_AMBULATORY_CARE_PROVIDER_SITE_OTHER): Payer: PPO | Admitting: Sports Medicine

## 2017-10-07 VITALS — BP 124/60 | HR 87 | Ht 66.0 in | Wt 216.0 lb

## 2017-10-07 VITALS — BP 124/60 | HR 87 | Temp 98.2°F | Ht 66.0 in | Wt 216.4 lb

## 2017-10-07 DIAGNOSIS — I10 Essential (primary) hypertension: Secondary | ICD-10-CM

## 2017-10-07 DIAGNOSIS — N183 Chronic kidney disease, stage 3 unspecified: Secondary | ICD-10-CM

## 2017-10-07 DIAGNOSIS — M9904 Segmental and somatic dysfunction of sacral region: Secondary | ICD-10-CM

## 2017-10-07 DIAGNOSIS — E669 Obesity, unspecified: Secondary | ICD-10-CM

## 2017-10-07 DIAGNOSIS — E785 Hyperlipidemia, unspecified: Secondary | ICD-10-CM

## 2017-10-07 DIAGNOSIS — R29898 Other symptoms and signs involving the musculoskeletal system: Secondary | ICD-10-CM

## 2017-10-07 DIAGNOSIS — M47816 Spondylosis without myelopathy or radiculopathy, lumbar region: Secondary | ICD-10-CM | POA: Diagnosis not present

## 2017-10-07 DIAGNOSIS — M48062 Spinal stenosis, lumbar region with neurogenic claudication: Secondary | ICD-10-CM

## 2017-10-07 DIAGNOSIS — M9903 Segmental and somatic dysfunction of lumbar region: Secondary | ICD-10-CM

## 2017-10-07 DIAGNOSIS — F3342 Major depressive disorder, recurrent, in full remission: Secondary | ICD-10-CM | POA: Diagnosis not present

## 2017-10-07 DIAGNOSIS — I2510 Atherosclerotic heart disease of native coronary artery without angina pectoris: Secondary | ICD-10-CM

## 2017-10-07 DIAGNOSIS — R739 Hyperglycemia, unspecified: Secondary | ICD-10-CM

## 2017-10-07 DIAGNOSIS — J4540 Moderate persistent asthma, uncomplicated: Secondary | ICD-10-CM

## 2017-10-07 DIAGNOSIS — D692 Other nonthrombocytopenic purpura: Secondary | ICD-10-CM | POA: Diagnosis not present

## 2017-10-07 DIAGNOSIS — M159 Polyosteoarthritis, unspecified: Secondary | ICD-10-CM

## 2017-10-07 DIAGNOSIS — G629 Polyneuropathy, unspecified: Secondary | ICD-10-CM | POA: Diagnosis not present

## 2017-10-07 DIAGNOSIS — M9905 Segmental and somatic dysfunction of pelvic region: Secondary | ICD-10-CM

## 2017-10-07 DIAGNOSIS — Z0000 Encounter for general adult medical examination without abnormal findings: Secondary | ICD-10-CM

## 2017-10-07 MED ORDER — LOSARTAN POTASSIUM 100 MG PO TABS: 100.0000 mg | ORAL_TABLET | Freq: Every day | ORAL | 3 refills | Status: DC

## 2017-10-07 NOTE — Progress Notes (Signed)
PROCEDURE NOTE : OSTEOPATHIC MANIPULATION The decision today to treat with Osteopathic Manipulative Therapy (OMT) was based on physical exam findings. Verbal consent was obtained following a discussion with the patient regarding the of risks, benefits and potential side effects, including an acute pain flare,post manipulation soreness and need for repeat treatments.   Contraindications to OMT reviewed and include: NONE  Manipulation was performed as below: Regions treated: Lumbar spine, Pelvis and Sacrum OMT Techniques Used: HVLA, muscle energy and myofascial release, long lever  The patient tolerated the treatment well and reported Improved symptoms following treatment today. Patient was given medications, exercises, stretches and lifestyle modifications per AVS and verbally.   OSTEOPATHIC/STRUCTURAL EXAM:   L3 FRS left L5 FRS left  Left anterior innominate Right on right sacral torsion

## 2017-10-07 NOTE — Progress Notes (Signed)
[name] [name], [place] at [place]  BRAEDEN KENNAN - 77 y.o. male MRN [id number]  Date of birth: 01/17/1941  Visit Date: 10/07/2017  PCP: [name], MD   Referred by: [name], MD  Scribe(s) for today's visit: [name], LAT, ATC  SUBJECTIVE:  [name] is here for Follow-up (LBP w/ L leg weakness) .    05/27/17: His thoracic and lumbar pain symptoms INITIALLY: Began a week prior to presentation after playing pickle ball. He felt pain on the R side of his back. A few days later he lifted a heavy bag a cat liter and felt extreme pain in his lower back. He also notes that he did planks on Monday. Push ups have flared up back pain in the past. Described as severe aching, radiating to L leg and gluteal region Worsened with movement, sit-to-stand, walking Improved with rest Additional associated symptoms include:  He denies groin pain and neck pain.  At this time symptoms are improving compared to onset, pain is a little less severe and doesn't extend as far.  He has been taking Diazepam at bedtime to help with sleep. Rx was originally for back pain after spinal surgery. He has tried taking Tylenol with some relief. He has been using heat with some relief.   06/07/17: Compared to the last office visit, his previously described symptoms show no change.  Current symptoms are moderate/severe & are radiating to RT side. He denies radiating pain into legs.  He tried a steroid dose pack and did get some relief while on the medication. He has tried taking Tylenol and Diazepam with some relief.   06/21/17: Compared to the last office visit, his previously described symptoms are worsening, having left-sided lower back pain. He was standing and rotated at the hips Saturday and has had severe pain since then.  LBP is constant. He feels like pain is related to abnormal gait.  Current symptoms are moderate/severe  & are radiating to L gluteal region.  He has been taking Tylenol prn.   07/02/2017: Compared to the last office visit, his previously described symptoms show no change Current symptoms are moderate & are radiating to L gluteal region He has been taking Tylenol prn with minimal relief.   08/26/2017: Compared to the last office visit on 07/02/17, his previously described low back pain symptoms are improved from previously but is still in pain w/ walking and standing.  He is walking x 30 min/day for exercise purposes.  He states that his R hand is "really bad" right now and cannot fully close/flex his R ring finger. Current symptoms are moderate & are radiating to L distal thigh (pain and N/T) He has been taking Tylenol prn for pain.  Pt saw Dr. [name] on 07/19/17 and had an L4-5 epidural. Had an L-spine XR on 05/27/17 and an MRI 07/01/17  09/06/2017: Compared to the last office visit, his previously described symptoms are worsening, midline LBP. L leg pain has improved since he last saw Dr. [name].  Current symptoms are moderate-severe & are radiating to the hips. 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  He has tried taking Tylenol in the past prn for the pain.   10/07/17: Compared to the last office visit, his previously described LBP and L leg weakness symptoms show no change.  He states that he is still having difficulty walking and standing. Current symptoms are moderate &  are radiating to B legs, mainly the L LE.  Today he is having most of his radiating pain into the L SIJ/glutes. He was prescribed a prednisone taper at his last visit and takes Tylenol prn.  He had a facet injection w/ Dr. [name] on 09/13/17 f/b an epidural injection on 09/27/17 which he did not respond well to.   REVIEW OF SYSTEMS: Reports night time disturbances. Denies fevers, chills, or night sweats. Denies unexplained weight loss. Reports personal history of cancer. Denies changes in bowel or bladder  habits. Denies recent unreported falls. Denies new or worsening dyspnea or wheezing. Reports headaches or dizziness. Yes to dizziness Reports numbness, tingling or weakness  In the extremities - painful tingling in the L LE from hip to knee. Reports dizziness or presyncopal episodes Reports lower extremity edema - in B ankle   HISTORY & PERTINENT PRIOR DATA:  Prior History reviewed and updated per electronic medical record.  Significant/pertinent history, findings, studies include:  reports that he quit smoking about 25 years ago. His smoking use included cigarettes. He has a 35.00 pack-year smoking history. He has never used smokeless tobacco. Recent Labs    10/01/17 1703  HGBA1C 5.8*   No specialty comments available. No problems updated.  OBJECTIVE:  VS:  HT:5\' 6"  (167.6 cm)   WT:216 lb (98 kg)  BMI:34.88    BP:124/60  HR:87bpm  TEMP: ( )  RESP:97 %   PHYSICAL EXAM: Constitutional: WDWN, Non-toxic appearing. Psychiatric: Alert & appropriately interactive.  Not depressed or anxious appearing. Respiratory: No increased work of breathing.  Trachea Midline Eyes: Pupils are equal.  EOM intact without nystagmus.  No scleral icterus  Vascular Exam: warm to touch trace pedal edema  lower extremity neuro exam: Positive straight leg raise on the left.  He has pain with palpation of the greater sciatic notch as well as popliteal space.  He has a slow sit to stand test of 3 to 5 seconds that initiates in a crouching position.  He has pain directly over the piriformis.  He has normal internal and external rotation of the hip on both sides.  Bilateral paraspinal muscle spasms.  Otherwise per osteopathic exam.  Manual muscle testing is 5 out of 5 although he has poor proprioception in the ankle dorsiflexors and plantar flexors.   ASSESSMENT & PLAN:   1. Lumbar spondylosis   2. Weakness of left leg   3. Spinal stenosis, lumbar region, with neurogenic claudication   4. Somatic  dysfunction of lumbar region   5. Somatic dysfunction of pelvis region   6. Somatic dysfunction of sacral region     PLAN: He is continued to have issues with spinal stenosis and neurogenic claudication related to his back.  He had only minimal relief with the epidural steroid injection with Dr. [name] 10 days ago.  Osteopathic manipulation performed today to try to help with some of the soft tissue that is associated with the persistent back pain being as well as with likely piriformis syndrome.  We did discuss that other alternative treatments including physical therapy and/or dry needling may be beneficial and he will look into returning to [facility] for repeat acupuncture/dry needling which recommended at this time.  If any lack of improvement over the next 3 to 4 weeks to consider neurosurgical evaluation although he would like to try to avoid surgery if possible.  Follow-up in 4 weeks for reevaluation and consideration of repeat manipulation.  Follow-up: Return in about 1 month (  around 11/04/2017).      Please see additional documentation for Objective, Assessment and Plan sections. Pertinent additional documentation may be included in corresponding procedure notes, imaging studies, problem based documentation and patient instructions. Please see these sections of the encounter for additional information regarding this visit.  CMA/ATC served as Education administrator during this visit. History, Physical, and Plan performed by medical provider. Documentation and orders reviewed and attested to.      Gerda Diss, [facility] Physician

## 2017-10-07 NOTE — Progress Notes (Signed)
Joshua [name] - 77 y.o. male MRN [id number]  Date of birth: 1940-11-08  Office Visit Note: Visit Date: 09/27/2017 PCP: [name], MD Referred by: [name], MD  Subjective: Chief Complaint  Patient presents with  . Lower Back - Pain  . Left Leg - Pain   HPI: Mr. Joshua [name] is a 77 year old gentleman who comes in today after having had diagnostic medial branch blocks.  Unfortunately these were not very diagnostic of his pain in general.  He may be got 40% but when you talk to him he really does not feel like it helped very much at all.  I think he understood the diagnostic aspect of it is well as we do have some folks that we will say that it did not help very long and when it helped a great deal amount.  Nonetheless he is still having chronic back pain and bilaterally and left more than right leg pain.  He has some stenosis which is significant.  He really has failed conservative care through Dr. Paulla Fore and really has not gotten much relief with epidural injection.  I think at least one time a bilateral L4 transforaminal injection just to see if we can get him longer term relief and be a little bit more diagnostic.  If it does not help much then I think he should return to Dr. Paulla Fore for questions concerning continued conservative care versus surgical referral to a neurosurgeon.   ROS Otherwise per HPI.  Assessment & Plan: Visit Diagnoses:  1. Lumbar radiculopathy   2. Spinal stenosis of lumbar region with neurogenic claudication     Plan: No additional findings.   Meds & Orders:  Meds ordered this encounter  Medications  . betamethasone acetate-betamethasone sodium phosphate (CELESTONE) injection 12 mg    Orders Placed This Encounter  Procedures  . XR C-ARM NO REPORT  . Epidural Steroid injection    Follow-up: Return if symptoms worsen or fail to improve.   Procedures: No procedures performed  Lumbosacral Transforaminal Epidural Steroid Injection -  Sub-Pedicular Approach with Fluoroscopic Guidance  Patient: Joshua [name]      Date of Birth: 01/21/1941 MRN: [id number] PCP: [name], MD      Visit Date: 09/27/2017   Universal Protocol:    Date/Time: 09/27/2017  Consent Given By: the patient  Position: PRONE  Additional Comments: Vital signs were monitored before and after the procedure. Patient was prepped and draped in the usual sterile fashion. The correct patient, procedure, and site was verified.   Injection Procedure Details:  Procedure Site One Meds Administered:  Meds ordered this encounter  Medications  . betamethasone acetate-betamethasone sodium phosphate (CELESTONE) injection 12 mg    Laterality: Bilateral  Location/Site:  L4-L5  Needle size: 22 G  Needle type: Spinal  Needle Placement: Transforaminal  Findings:    -Comments: Excellent flow of contrast along the nerve and into the epidural space.  Procedure Details: After squaring off the end-plates to get a true AP view, the C-arm was positioned so that an oblique view of the foramen as noted above was visualized. The target area is just inferior to the "nose of the scotty dog" or sub pedicular. The soft tissues overlying this structure were infiltrated with 2-3 ml. of 1% Lidocaine without Epinephrine.  The spinal needle was inserted toward the target using a "trajectory" view along the fluoroscope beam.  Under AP and lateral visualization, the needle was advanced so it did not puncture dura and  was located close the 6 O'Clock position of the pedical in AP tracterory. Biplanar projections were used to confirm position. Aspiration was confirmed to be negative for CSF and/or blood. A 1-2 ml. volume of Isovue-250 was injected and flow of contrast was noted at each level. Radiographs were obtained for documentation purposes.   After attaining the desired flow of contrast documented above, a 0.5 to 1.0 ml test dose of 0.25% Marcaine was injected  into each respective transforaminal space.  The patient was observed for 90 seconds post injection.  After no sensory deficits were reported, and normal lower extremity motor function was noted,   the above injectate was administered so that equal amounts of the injectate were placed at each foramen (level) into the transforaminal epidural space.   Additional Comments:  The patient tolerated the procedure well Dressing: Band-Aid    Post-procedure details: Patient was observed during the procedure. Post-procedure instructions were reviewed.  Patient left the clinic in stable condition.    Clinical History: MRI LUMBAR SPINE WITHOUT CONTRAST  TECHNIQUE: Multiplanar, multisequence MR imaging of the lumbar spine was performed. No intravenous contrast was administered.  COMPARISON:  Lumbar radiography 05/27/2017  FINDINGS: Segmentation: 5 non-rib-bearing vertebrae on comparison radiography.  Alignment:  Mild levoscoliosis.  Grade 1 anterolisthesis at L4-5.  Vertebrae: Degenerative appearing marrow edema about the left L5-S1 facet. No evidence of fracture, discitis, or aggressive bone lesion.  Conus medullaris and cauda equina: Conus extends to the L1-2 level. Conus and cauda equina appear normal.  Paraspinal and other soft tissues: Small bilateral renal cystic intensities.  Disc levels:  T12- L1: Spondylosis.  No impingement.  L1-L2: Spondylosis with disc narrowing and bulging. Mild facet spurring. No impingement.  L2-L3: Degenerative posterior element hypertrophy.  No impingement  L3-L4: Degenerative posterior element hypertrophy.  No impingement  L4-L5: Severe facet degeneration with bulky hypertrophy and joint distortion. Small bilateral joint effusion. There is grade 1 anterolisthesis. The disc is narrowed and desiccated with mild bulging. Advanced spinal stenosis. Right more than left moderate foraminal narrowing.  L5-S1:Severe degenerative facet  arthropathy with active facet arthritis on the left. The joints are markedly spurred and hypertrophic with bilateral joint effusion. Mild spinal stenosis. Hypertrophy could affect the descending left S1 nerve root. Moderate left and mild right foraminal narrowing.  IMPRESSION: 1. 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 2. L4-5 advanced spinal stenosis. Moderate right more than left foraminal narrowing. 3. L5-S1 moderate left and mild right foraminal narrowing. Left subarticular recess narrowing that could affect the left S1 nerve root. 4. Mild levoscoliosis.   Electronically Signed   By: Monte Fantasia M.D.   On: 07/01/2017 10:16   He reports that he quit smoking about 25 years ago. His smoking use included cigarettes. He has a 35.00 pack-year smoking history. He has never used smokeless tobacco.  Recent Labs    10/01/17 1703  HGBA1C 5.8*    Objective:  VS:  HT:    WT:   BMI:     BP:127/80  HR:95bpm  TEMP: ( )  RESP:  Physical Exam  Ortho Exam Imaging: No results found.  Past Medical/Family/Surgical/Social History: Medications & Allergies reviewed per EMR, new medications updated. Patient Active Problem List   Diagnosis Date Noted  . Weakness of left leg 06/21/2017  . DJD (degenerative joint disease)   . Arthritis   . Neck pain 02/08/2017  . Venous insufficiency 12/29/2016  .  Ganglion cyst of tendon sheath of right hand 12/29/2016  . Avulsion fracture of metatarsal bone of right foot 11/25/2016  . Gout 11/20/2016  . Obesity, Class I, BMI 30-34.9 10/03/2015  . Hereditary and idiopathic peripheral neuropathy 09/25/2014  . Allergic rhinitis 05/31/2014  . Fatigue 11/09/2013  . CKD (chronic kidney disease), stage III ([place]) 10/23/2013  . Dizziness 08/23/2013  . Neuropathy ([place]) 08/21/2013  . RLS (restless legs syndrome) 01/03/2013  . Low back pain radiating to  left leg 10/09/2011  . History of bladder cancer 04/01/2010  . OSTEOARTHRITIS, LOWER LEG, LEFT 10/04/2009  . TOBACCO ABUSE, HX OF 12/26/2008  . ANEMIA, IRON DEFICIENCY 11/06/2008  . 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 Essential hypertension 02/21/2007  . OSA (obstructive sleep apnea) 11/02/2006  . Hyperlipidemia 10/12/2006  . Major depression in full remission ([place]) 10/12/2006  . Coronary atherosclerosis 10/12/2006  . Asthma 10/12/2006  . GERD 10/12/2006  . Irritable bowel syndrome 10/12/2006   Past Medical History:  Diagnosis Date  . Acute medial meniscal tear   . Anemia   . Arthritis    "middle finger right hand; right knee; neck" (02/06/2014)  . Asthma   . Bladder cancer ([place]) 04/2010   "cauterized during prostate OR"  . CAD (coronary artery disease)    CABG 2001  . Depression   . Diverticulosis   . DJD (degenerative joint disease)   . GERD (gastroesophageal reflux disease)   . History of blood transfusion 2001   "related to the bypass OR"  . History of blood transfusion 03/24/1999   "related to the bypass OR"  . History of gout   . Hyperlipidemia   . Hypertension   . IBS (irritable bowel syndrome)   . Obesity   . OSA (obstructive sleep apnea)   . Pancreatitis ~ 1980  . Pneumonia 1980's X 2  . Prostate cancer ([place]) 04/2010   Family History  Problem Relation Age of Onset  . Heart disease Mother   . Hyperlipidemia Mother   . Heart disease Father   . Hyperlipidemia Father   . Diabetes Brother    Past Surgical History:  Procedure Laterality Date  . ANTERIOR CERVICAL DECOMP/DISCECTOMY FUSION  05/26/06  . BACK SURGERY    . CARDIAC CATHETERIZATION  11/20/1999  . CORONARY ARTERY BYPASS GRAFT  11/20/1999   SVG-RI1-RI2, SVG-OM, SVG-dRCA  . KNEE ARTHROSCOPY Right 1992; 04/2008  . LAPAROSCOPIC CHOLECYSTECTOMY  2007  . NASAL SEPTUM SURGERY Right 2007  . ROBOT ASSISTED LAPAROSCOPIC RADICAL PROSTATECTOMY  04/2010  . THYROIDECTOMY, PARTIAL  1980   Social History    Occupational History  . Occupation: retired    Fish farm manager: RETIRED  Tobacco Use  . Smoking status: Former Smoker    Packs/day: 1.00    Years: 35.00    Pack years: 35.00    Types: Cigarettes    Last attempt to quit: 06/16/1992    Years since quitting: 25.3  . Smokeless tobacco: Never Used  . Tobacco comment: discussed the AAA but thinks he may have had this   Substance and Sexual Activity  . Alcohol use: Yes    Alcohol/week: 2.4 oz    Types: 4 Shots of liquor per week    Comment: mixed drink  . Drug use: No  . Sexual activity: Never

## 2017-10-07 NOTE — Procedures (Signed)
Lumbosacral Transforaminal Epidural Steroid Injection - Sub-Pedicular Approach with Fluoroscopic Guidance  Patient: Joshua [name]      Date of Birth: 22-Aug-1940 MRN: [id number] PCP: [name], MD      Visit Date: 09/27/2017   Universal Protocol:    Date/Time: 09/27/2017  Consent Given By: the patient  Position: PRONE  Additional Comments: Vital signs were monitored before and after the procedure. Patient was prepped and draped in the usual sterile fashion. The correct patient, procedure, and site was verified.   Injection Procedure Details:  Procedure Site One Meds Administered:  Meds ordered this encounter  Medications  . betamethasone acetate-betamethasone sodium phosphate (CELESTONE) injection 12 mg    Laterality: Bilateral  Location/Site:  L4-L5  Needle size: 22 G  Needle type: Spinal  Needle Placement: Transforaminal  Findings:    -Comments: Excellent flow of contrast along the nerve and into the epidural space.  Procedure Details: After squaring off the end-plates to get a true AP view, the C-arm was positioned so that an oblique view of the foramen as noted above was visualized. The target area is just inferior to the "nose of the scotty dog" or sub pedicular. The soft tissues overlying this structure were infiltrated with 2-3 ml. of 1% Lidocaine without Epinephrine.  The spinal needle was inserted toward the target using a "trajectory" view along the fluoroscope beam.  Under AP and lateral visualization, the needle was advanced so it did not puncture dura and was located close the 6 O'Clock position of the pedical in AP tracterory. Biplanar projections were used to confirm position. Aspiration was confirmed to be negative for CSF and/or blood. A 1-2 ml. volume of Isovue-250 was injected and flow of contrast was noted at each level. Radiographs were obtained for documentation purposes.   After attaining the desired flow of contrast documented above,  a 0.5 to 1.0 ml test dose of 0.25% Marcaine was injected into each respective transforaminal space.  The patient was observed for 90 seconds post injection.  After no sensory deficits were reported, and normal lower extremity motor function was noted,   the above injectate was administered so that equal amounts of the injectate were placed at each foramen (level) into the transforaminal epidural space.   Additional Comments:  The patient tolerated the procedure well Dressing: Band-Aid    Post-procedure details: Patient was observed during the procedure. Post-procedure instructions were reviewed.  Patient left the clinic in stable condition.

## 2017-10-07 NOTE — Progress Notes (Signed)
Phone: [phone]  Subjective:  Patient presents today for their annual physical. Chief complaint-noted.   See problem oriented charting- ROS- full  review of systems was completed and negative except for: not as active due to back pain, appetite change- eating less, fatigue, hearing loss and tinnitus- stable, sore throat from allergies or URI- also has cough in last day or two, leg swelling stable, constpation, diarrhea, nausea, cold intolerance, pain with urination per baseline with known penile pain, back pain, gait problem ,neck pain, seasonal allergies, dizziness, numbness, feels weak, bruises easily, sometimes feels confused- up to date on wellness visits/memory testing  The following were reviewed and entered/updated in epic: Past Medical History:  Diagnosis Date  . Acute medial meniscal tear   . Anemia   . Arthritis    "middle finger right hand; right knee; neck" (02/06/2014)  . Asthma   . Bladder cancer ([place]) 04/2010   "cauterized during prostate OR"  . CAD (coronary artery disease)    CABG 2001  . Depression   . Diverticulosis   . DJD (degenerative joint disease)   . GERD (gastroesophageal reflux disease)   . History of blood transfusion 2001   "related to the bypass OR"  . History of blood transfusion 03/24/1999   "related to the bypass OR"  . History of gout   . Hyperlipidemia   . Hypertension   . IBS (irritable bowel syndrome)   . Obesity   . OSA (obstructive sleep apnea)   . Pancreatitis ~ 1980  . Pneumonia 1980's X 2  . Prostate cancer ([place]) 04/2010   Patient Active Problem List   Diagnosis Date Noted  . Coronary atherosclerosis 10/12/2006    Priority: High  . Irritable bowel syndrome 10/12/2006    Priority: High  . Gout 11/20/2016    Priority: Medium  . CKD (chronic kidney disease), stage III ([place]) 10/23/2013    Priority: Medium  . Neuropathy ([place]) 08/21/2013    Priority: Medium  . History of bladder cancer 04/01/2010    Priority: Medium  .  Essential hypertension 02/21/2007    Priority: Medium  . Hyperlipidemia 10/12/2006    Priority: Medium  . Major depression in full remission ([place]) 10/12/2006    Priority: Medium  . Asthma 10/12/2006    Priority: Medium  . DJD (degenerative joint disease)     Priority: Low  . Arthritis     Priority: Low  . Neck pain 02/08/2017    Priority: Low  . Venous insufficiency 12/29/2016    Priority: Low  . Ganglion cyst of tendon sheath of right hand 12/29/2016    Priority: Low  . Avulsion fracture of metatarsal bone of right foot 11/25/2016    Priority: Low  . Obesity, Class I, BMI 30-34.9 10/03/2015    Priority: Low  . Allergic rhinitis 05/31/2014    Priority: Low  . Fatigue 11/09/2013    Priority: Low  . Dizziness 08/23/2013    Priority: Low  . RLS (restless legs syndrome) 01/03/2013    Priority: Low  . Low back pain radiating to left leg 10/09/2011    Priority: Low  . OSTEOARTHRITIS, LOWER LEG, LEFT 10/04/2009    Priority: Low  . TOBACCO ABUSE, HX OF 12/26/2008    Priority: Low  . ANEMIA, IRON DEFICIENCY 11/06/2008    Priority: Low  . 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 OSA (obstructive sleep apnea) 11/02/2006    Priority: Low  . GERD 10/12/2006    Priority: Low  .  Senile purpura ([place]) 10/07/2017  . Hyperglycemia 10/07/2017  . Weakness of left leg 06/21/2017  . Hereditary and idiopathic peripheral neuropathy 09/25/2014   Past Surgical History:  Procedure Laterality Date  . ANTERIOR CERVICAL DECOMP/DISCECTOMY FUSION  05/26/06  . BACK SURGERY    . CARDIAC CATHETERIZATION  11/20/1999  . CORONARY ARTERY BYPASS GRAFT  11/20/1999   SVG-RI1-RI2, SVG-OM, SVG-dRCA  . KNEE ARTHROSCOPY Right 1992; 04/2008  . LAPAROSCOPIC CHOLECYSTECTOMY  2007  . NASAL SEPTUM SURGERY Right 2007  . ROBOT ASSISTED LAPAROSCOPIC RADICAL PROSTATECTOMY  04/2010  . THYROIDECTOMY, PARTIAL  1980    Family History  Problem Relation Age of Onset  . Heart disease Mother   .  Hyperlipidemia Mother   . Heart disease Father   . Hyperlipidemia Father   . Diabetes Brother     Medications- reviewed and updated Current Outpatient Medications  Medication Sig Dispense Refill  . acetaminophen (TYLENOL) 500 MG tablet Take 1,000 mg by mouth every 6 (six) hours as needed for headache (pain). Reported on 07/02/2015    . albuterol (PROAIR HFA) 108 (90 BASE) MCG/ACT inhaler Inhale 1-2 puffs into the lungs every 6 (six) hours as needed for wheezing or shortness of breath. 18 g 3  . aspirin EC 81 MG tablet Take 81 mg by mouth daily.    . Cyanocobalamin (VITAMIN B 12 PO) Take 800 mg by mouth daily.    [name] desloratadine (CLARINEX) 5 MG tablet Take 5 mg by mouth at bedtime.     . diazepam (VALIUM) 5 MG tablet Take 1 tablet (5 mg total) by mouth at bedtime as needed. 30 tablet 1  . Evolocumab (REPATHA SURECLICK) 188 MG/ML SOAJ Inject 140 mg into the skin every 14 (fourteen) days. 2 pen 11  . FLOVENT HFA 110 MCG/ACT inhaler Inhale 2 puffs into the lungs 2 (two) times daily. Reported on 03/22/2015  5  . fluticasone (FLONASE) 50 MCG/ACT nasal spray Place 1 spray into both nostrils 2 (two) times daily.     . folic acid (FOLVITE) 416 MCG tablet Take 400 mcg by mouth daily.    [name] gabapentin (NEURONTIN) 100 MG capsule Take 1 cap three times a day, 4 caps at bedtime 630 capsule 3  . hydrochlorothiazide (HYDRODIURIL) 25 MG tablet Take 1 tablet (25 mg total) by mouth daily. 90 tablet 3  . ketoconazole (NIZORAL) 2 % cream Apply topically 2 (two) times daily.    . methylPREDNISolone (MEDROL DOSEPAK) 4 MG TBPK tablet Take by mouth as directed. Take 6 tablets on the first day prescribed then as directed. 21 tablet 0  . NON FORMULARY     . nystatin (MYCOSTATIN/NYSTOP) powder Apply topically 4 (four) times daily. 15 g 0  . omeprazole (PRILOSEC) 20 MG capsule Take 1 capsule (20 mg total) by mouth daily. 30 capsule 3  . ondansetron (ZOFRAN ODT) 8 MG disintegrating tablet Take 1 tablet (8 mg total) by  mouth every 8 (eight) hours as needed for nausea or vomiting. 20 tablet 0  . polyethylene glycol powder (GLYCOLAX/MIRALAX) powder Take 17 g by mouth daily. 255 g 0  . Probiotic Product (ALIGN) 4 MG CAPS Take 4 mg by mouth daily.     . simvastatin (ZOCOR) 20 MG tablet Take 1 tablet (20 mg total) by mouth at bedtime. 90 tablet 3  . losartan (COZAAR) 100 MG tablet Take 1 tablet (100 mg total) by mouth daily. 90 tablet 3   No current facility-administered medications for this visit.     Allergies-reviewed  and updated Allergies  Allergen Reactions  . 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 Bromfed Nausea And Vomiting  . Clarithromycin Other (See Comments)     gastritis  . Codeine Other (See Comments)    Trouble breathing  . Doxycycline Hyclate Nausea And Vomiting  . Modafinil Other (See Comments)     anxiety-nervousness  . Oxycodone-Acetaminophen Itching  . Promethazine Hcl Other (See Comments)     fainting  . Quinolones     Cipro  . Telithromycin Nausea And Vomiting  . Hydrocodone-Acetaminophen Rash  . Sulfonamide Derivatives Rash    Social History   Social History Narrative   Married 42 years in 2015. No kids (mumps at age 78)      Retired from [employer] in Engineer, technical sales   Volunteers at [place]: poker, movies and tv, staying active    Objective: BP 124/60 (BP Location: Left Arm, Patient Position: Sitting, Cuff Size: Large)   Pulse 87   Temp 98.2 F (36.8 C) (Oral)   Ht 5\' 6"  (1.676 m)   Wt 216 lb 6.4 oz (98.2 kg)   SpO2 97%   BMI 34.93 kg/m  Gen: NAD, resting comfortably HEENT: Mucous membranes are moist. Oropharynx normal Neck: no thyromegaly CV: RRR no murmurs rubs or gallops Lungs: CTAB no crackles, wheeze, rhonchi Abdomen: soft/nontender/nondistended/normal bowel sounds. No rebound or guarding. obese Ext: trace edema Skin: warm, dry Neuro: grossly normal, moves all extremities,  PERRLA  Assessment/Plan:  77 y.o. male presenting for annual physical.  Health Maintenance counseling: 1. Anticipatory guidance: Patient counseled regarding regular dental exams -q6 months, eye exams -yearly, wearing seatbelts.  2. Risk factor reduction:  Advised patient of need for regular exercise and diet rich and fruits and vegetables to reduce risk of heart attack and stroke. Exercise- walking is hard for him- wants to do some weights- encouraged trial 3 days a week at minimum- he may also try recumbent bike. Diet- over last month down 2 lbs, from last year up 6 lbs overall- he plans to cut down on portion sizes.  Wt Readings from Last 3 Encounters:  10/07/17 216 lb 6.4 oz (98.2 kg)  10/01/17 217 lb 12.8 oz (98.8 kg)  09/06/17 218 lb 6.4 oz (99.1 kg)  3. Immunizations/screenings/ancillary studies- - We discussed shingrix availability issues  as well as coverage issues (part D medicare)- I recommended that patient get vaccine at the pharmacy  Immunization History  Administered Date(s) Administered  . Influenza Split 01/08/2012, 11/12/2014  . Influenza Whole 12/08/2007, 12/26/2008, 12/22/2010  . Influenza, High Dose Seasonal PF 11/16/2015, 01/01/2017  . Influenza,inj,Quad PF,6+ Mos 11/30/2012  . Influenza-Unspecified 01/12/2014  . Pneumococcal Conjugate-13 05/31/2014  . Pneumococcal Polysaccharide-23 06/04/2006, 07/15/2012  . Td 04/23/2005  . Tdap 02/18/2016  . Zoster 01/08/2012  4. Prostate cancer screening- history prostate cancer s/p prostatectomy- no longer following with urology- will check psa yearly - he is getting established with a new MD- thinking Dr. Alinda Money. He hasnt done breakthrough therapy recently. Still with penile pain Lab Results  Component Value Date   PSA <0.1 10/01/2017   PSA 0.00 (L) 04/06/2012   PSA 1.35 02/18/2010   5. Colon cancer screening - 12/12/08- will be due next year for colonoscopy 6. Skin cancer screening- Dr. Jarome Matin. advised regular sunscreen  use. Denies worrisome, changing, or new skin lesions.  7. former smoker- 35 pack years quit at age 44. Get urine  yearly - normal this year plus will be seeing urology again  Status of chronic or acute concerns   CAD- on asa, statin, repatha. History Cabg x5. Low risk study 02/2014 as well. Dr. Percival Spanish. No recent chest pain- plans for stress test next visit  IBS C- reasonable control recently- has actually had some diarrhea due to miralax but constipation is unbearable so tolerates diarrhea  Neuropathy- gabapentin helps . Follows with DR. Aquino  Depression- on medication in past but too tired/sleepy. We have considered restarting lexapro if needed. PHQ9 within a week of 7 but may have been dealing with illness. He has felt some discouragement from back pain- felt really tired with tylenol arthritis and tramadol. We are hoping as he continues to work with ortho and sports medicine on back that his back will improve  HLD- on repatha due to intolerance of  Stronger statins. Simvastatin was continued by cardiology for now.   Gout- no recent flares on no rx  Senile purpura- noted on arms, asa contributes  HTN- controlled on irbesartan 300 and hctz 25mg - we will look for alternate ARB- calling pharmacy and we left them a message- hoping to get more info. He will message [place] if hasnt heard by tomorrow.   CKD III- monitoring GFR and on ARB in case proteinuric element  Asthma- reasonable control on flovent  Remains on CPAP for OSA   GERD- failed zantac. On prilosec 20mg   Hyperglycemia- discussed importance of diet/exercise for reducing diabetes risk Lab Results  Component Value Date   HGBA1C 5.8 (H) 10/01/2017   Insomnia- sparing valium - uses it more when back pain was worse  Future Appointments  Date Time Provider [place]  10/07/2017  9:20 AM Gerda Diss, DO [facility]  01/25/2018  8:30 AM Cameron Sprang, MD [facility] None   Lab/Order associations: Preventative  health care  Atherosclerosis of native coronary artery of native heart without angina pectoris  Senile purpura (HCC)  Recurrent major depressive disorder, in full remission ([place])  Neuropathy (HCC)  CKD (chronic kidney disease), stage III (HCC)  Obesity (BMI 30.0-34.9)  Essential hypertension  Osteoarthritis of multiple joints, unspecified osteoarthritis type  Hyperlipidemia, unspecified hyperlipidemia type  Moderate persistent asthma without complication  Hyperglycemia  Meds ordered this encounter  Medications  . losartan (COZAAR) 100 MG tablet    Sig: Take 1 tablet (100 mg total) by mouth daily.    Dispense:  90 tablet    Refill:  3    Return precautions advised.  [name], MD

## 2017-10-07 NOTE — Patient Instructions (Addendum)
Please check with your pharmacy to see if they have the shingrix vaccine. If they do- please get this immunization and update [place] by phone call or mychart with dates you receive the vaccine. This can be a tough vaccine for some people with flu like illness or very tender injection site just as an FYI but otherwise for shingles protection it is a great vaccine.   We are going to stop irbesartan since your pharmacy said its no longer available. Go back to losartan 100mg . Other options if this is not available or on recall are telmisartan 80mg , olmesartan 40mg , diovan 160mg - writing this for reference if needed- these are ones pharmacy said were available

## 2017-10-11 ENCOUNTER — Encounter (INDEPENDENT_AMBULATORY_CARE_PROVIDER_SITE_OTHER): Payer: PPO | Admitting: Physical Medicine and Rehabilitation

## 2017-10-19 DIAGNOSIS — J301 Allergic rhinitis due to pollen: Secondary | ICD-10-CM | POA: Diagnosis not present

## 2017-10-19 DIAGNOSIS — J3081 Allergic rhinitis due to animal (cat) (dog) hair and dander: Secondary | ICD-10-CM | POA: Diagnosis not present

## 2017-10-19 DIAGNOSIS — J3089 Other allergic rhinitis: Secondary | ICD-10-CM | POA: Diagnosis not present

## 2017-10-27 DIAGNOSIS — B356 Tinea cruris: Secondary | ICD-10-CM | POA: Diagnosis not present

## 2017-10-27 DIAGNOSIS — G4733 Obstructive sleep apnea (adult) (pediatric): Secondary | ICD-10-CM | POA: Diagnosis not present

## 2017-10-27 DIAGNOSIS — N393 Stress incontinence (female) (male): Secondary | ICD-10-CM | POA: Diagnosis not present

## 2017-10-27 DIAGNOSIS — Z8546 Personal history of malignant neoplasm of prostate: Secondary | ICD-10-CM | POA: Diagnosis not present

## 2017-11-02 DIAGNOSIS — J301 Allergic rhinitis due to pollen: Secondary | ICD-10-CM | POA: Diagnosis not present

## 2017-11-02 DIAGNOSIS — J3081 Allergic rhinitis due to animal (cat) (dog) hair and dander: Secondary | ICD-10-CM | POA: Diagnosis not present

## 2017-11-02 DIAGNOSIS — J3089 Other allergic rhinitis: Secondary | ICD-10-CM | POA: Diagnosis not present

## 2017-11-04 ENCOUNTER — Encounter: Payer: Self-pay | Admitting: Sports Medicine

## 2017-11-04 ENCOUNTER — Ambulatory Visit (INDEPENDENT_AMBULATORY_CARE_PROVIDER_SITE_OTHER): Payer: PPO | Admitting: Sports Medicine

## 2017-11-04 VITALS — BP 134/70 | HR 81 | Ht 66.0 in | Wt 217.2 lb

## 2017-11-04 DIAGNOSIS — M48062 Spinal stenosis, lumbar region with neurogenic claudication: Secondary | ICD-10-CM

## 2017-11-04 DIAGNOSIS — R29898 Other symptoms and signs involving the musculoskeletal system: Secondary | ICD-10-CM

## 2017-11-04 DIAGNOSIS — M9905 Segmental and somatic dysfunction of pelvic region: Secondary | ICD-10-CM

## 2017-11-04 DIAGNOSIS — M47816 Spondylosis without myelopathy or radiculopathy, lumbar region: Secondary | ICD-10-CM | POA: Diagnosis not present

## 2017-11-04 DIAGNOSIS — M9903 Segmental and somatic dysfunction of lumbar region: Secondary | ICD-10-CM | POA: Diagnosis not present

## 2017-11-04 DIAGNOSIS — M9904 Segmental and somatic dysfunction of sacral region: Secondary | ICD-10-CM | POA: Diagnosis not present

## 2017-11-04 NOTE — Progress Notes (Signed)
Joshua [name]. [name], Joshua at [facility]  Joshua [name] - 77 y.o. male MRN [id number]  Date of birth: [birth year]  Visit Date: 11/04/2017  PCP: Joshua Olp, MD   Referred by: Joshua Olp, MD  Scribe(s) for today's visit: Joshua [name], LAT, ATC  SUBJECTIVE:  Joshua Messing "Joshua [name]" is here for Follow-up (LBP and L leg pain) .    05/27/17: His thoracic and lumbar pain symptoms INITIALLY: Began a week prior to presentation after playing pickle ball. He felt pain on the R side of his back. A few days later he lifted a heavy bag a cat liter and felt extreme pain in his lower back. He also notes that he did planks on Monday. Push ups have flared up back pain in the past. Described as severe aching, radiating to L leg and gluteal region Worsened with movement, sit-to-stand, walking Improved with rest Additional associated symptoms include:  He denies groin pain and neck pain.  At this time symptoms are improving compared to onset, pain is a little less severe and doesn't extend as far.  He has been taking Diazepam at bedtime to help with sleep. Rx was originally for back pain after spinal surgery. He has tried taking Tylenol with some relief. He has been using heat with some relief.   06/07/17: Compared to the last office visit, his previously described symptoms show no change.  Current symptoms are moderate/severe & are radiating to RT side. He denies radiating pain into legs.  He tried a steroid dose pack and did get some relief while on the medication. He has tried taking Tylenol and Diazepam with some relief.   06/21/17: Compared to the last office visit, his previously described symptoms are worsening, having left-sided lower back pain. He was standing and rotated at the hips Saturday and has had severe pain since then.  LBP is constant. He feels like pain is related to abnormal gait.  Current symptoms are  moderate/severe & are radiating to L gluteal region.  He has been taking Tylenol prn.   07/02/2017: Compared to the last office visit, his previously described symptoms show no change Current symptoms are moderate & are radiating to L gluteal region He has been taking Tylenol prn with minimal relief.   08/26/2017: Compared to the last office visit on 07/02/17, his previously described low back pain symptoms are improved from previously but is still in pain w/ walking and standing.  He is walking x 30 min/day for exercise purposes.  He states that his R hand is "really bad" right now and cannot fully close/flex his R ring finger. Current symptoms are moderate & are radiating to L distal thigh (pain and N/T) He has been taking Tylenol prn for pain.  Pt saw Dr. [name] on 07/19/17 and had an L4-5 epidural. Had an L-spine XR on 05/27/17 and an MRI 07/01/17  09/06/2017: Compared to the last office visit, his previously described symptoms are worsening, midline LBP. L leg pain has improved since he last saw Dr. [name].  Current symptoms are moderate-severe & are radiating to the hips. 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  He has tried taking Tylenol in the past prn for the pain.   10/07/17: Compared to the last office visit, his previously described LBP and L leg weakness symptoms show no change.  He states that he is still having difficulty walking and standing. Current symptoms are moderate &  are radiating to B legs, mainly the L LE.  Today he is having most of his radiating pain into the L SIJ/glutes. He was prescribed a prednisone taper at his last visit and takes Tylenol prn.  He had a facet injection w/ Dr. [name] on 09/13/17 f/b an epidural injection on 09/27/17 which he did not respond well to.  11/04/2017: Compared to the last office visit on 10/07/17, his previously described LBP symptoms are improving w/ less overall pain.  He states that his L hip is bothering him the most  today. Current symptoms are moderate & are radiating to the L hip. He has been taking Tylenol prn.  He had a facet injection w/ Dr. [name] on 09/13/17 f/b an epidural injection on 09/27/17 which he did not respond well to.  He has been doing dry needling w/ Joshua [name] at [facility].  L-spine MRI - 07/01/17   REVIEW OF SYSTEMS: Denies night time disturbances. Denies fevers, chills, or night sweats. Denies unexplained weight loss. Reports personal history of cancer. Denies changes in bowel or bladder habits. Denies recent unreported falls. Denies new or worsening dyspnea or wheezing. Reports headaches or dizziness. Headaches Reports numbness, tingling or weakness  In the extremities - in L LE from hip to knee Reports dizziness or presyncopal episodes Reports lower extremity edema - in B ankles   HISTORY & PERTINENT PRIOR DATA:  Prior History reviewed and updated per electronic medical record.  Significant/pertinent history, findings, studies include:  reports that he quit smoking about 25 years ago. His smoking use included cigarettes. He has a 35.00 pack-year smoking history. He has never used smokeless tobacco. Recent Labs    10/01/17 1703  HGBA1C 5.8*   No specialty comments available. No problems updated.  OBJECTIVE:  VS:  HT:5\' 6"  (167.6 cm)   WT:217 lb 3.2 oz (98.5 kg)  BMI:35.07    BP:134/70  HR:81bpm  TEMP: ( )  RESP:94 %   PHYSICAL EXAM: CONSTITUTIONAL: Well-developed, Well-nourished and In no acute distress Alert & appropriately interactive. and Not depressed or anxious appearing. Respiratory: No increased work of breathing.  Trachea Midline EYES: Pupils are equal., EOM intact without nystagmus. and No scleral icterus.  Lower EXTREMITY EXAM: Warm and well perfused NEURO: unremarkable  MSK Exam: Bilateral lower extremities overall well aligned without deformity.    PROCEDURES & DATA REVIEWED:  PROCEDURE NOTE : OSTEOPATHIC MANIPULATION The  decision today to treat with Osteopathic Manipulative Therapy (OMT) was based on physical exam findings. Verbal consent was obtained following a discussion with the patient regarding the of risks, benefits and potential side effects, including an acute pain flare,post manipulation soreness and need for repeat treatments.     Contraindications to OMT: NONE  Manipulation was performed as below: Regions treated: Lumbar spine, Pelvis and Sacrum OMT Techniques Used: HVLA, muscle energy, myofascial release, soft tissue and facilitated positional release  The patient tolerated the treatment well and reported Improved symptoms following treatment today. Patient was given medications, exercises, stretches and lifestyle modifications per AVS and verbally.   OSTEOPATHIC/STRUCTURAL EXAM:   LUMBAR SPINE: L3 FRS right PELVIS: Right anterior innominate SACRUM: L on L sacral torsion   ASSESSMENT   1. Lumbar spondylosis   2. Weakness of left leg   3. Spinal stenosis, lumbar region, with neurogenic claudication   4. Somatic dysfunction of lumbar region   5. Somatic dysfunction of sacral region   6. Somatic dysfunction of pelvis region     PLAN:  Osteopathic manipulation  was performed today based on physical exam findings.  Please see procedure note for further information including Osteopathic Exam findings  He is doing quite well overall compared to where he was but still has significant limitations in mobility.  He has responded well to osteopathic manipulation and dry needling will continue with these at this time.  If any worsening numbness tingling or recurrent neurologic symptoms could consider repeat injection although he is quite hesitant to be agreeable to this.  No problem-specific Assessment & Plan notes found for this encounter. No orders of the defined types were placed in this encounter. No orders of the defined types were placed in this encounter.    Follow-up: Return in about 2 weeks  (around 11/18/2017) for consideration of repeat Osteopathic Manipulation.      Please see additional documentation for Objective, Assessment and Plan sections. Pertinent additional documentation may be included in corresponding procedure notes, imaging studies, problem based documentation and patient instructions. Please see these sections of the encounter for additional information regarding this visit.  CMA/ATC served as Education administrator during this visit. History, Physical, and Plan performed by medical provider. Documentation and orders reviewed and attested to.      Gerda Diss, [place] [facility] Physician

## 2017-11-16 DIAGNOSIS — J301 Allergic rhinitis due to pollen: Secondary | ICD-10-CM | POA: Diagnosis not present

## 2017-11-16 DIAGNOSIS — J3081 Allergic rhinitis due to animal (cat) (dog) hair and dander: Secondary | ICD-10-CM | POA: Diagnosis not present

## 2017-11-16 DIAGNOSIS — J3089 Other allergic rhinitis: Secondary | ICD-10-CM | POA: Diagnosis not present

## 2017-11-18 ENCOUNTER — Ambulatory Visit (INDEPENDENT_AMBULATORY_CARE_PROVIDER_SITE_OTHER): Payer: PPO | Admitting: Sports Medicine

## 2017-11-18 ENCOUNTER — Encounter: Payer: Self-pay | Admitting: Sports Medicine

## 2017-11-18 VITALS — BP 120/72 | HR 81 | Ht 66.0 in | Wt 215.8 lb

## 2017-11-18 DIAGNOSIS — M47816 Spondylosis without myelopathy or radiculopathy, lumbar region: Secondary | ICD-10-CM | POA: Diagnosis not present

## 2017-11-18 DIAGNOSIS — M9903 Segmental and somatic dysfunction of lumbar region: Secondary | ICD-10-CM

## 2017-11-18 DIAGNOSIS — M9904 Segmental and somatic dysfunction of sacral region: Secondary | ICD-10-CM | POA: Diagnosis not present

## 2017-11-18 DIAGNOSIS — M9905 Segmental and somatic dysfunction of pelvic region: Secondary | ICD-10-CM

## 2017-11-18 DIAGNOSIS — M9908 Segmental and somatic dysfunction of rib cage: Secondary | ICD-10-CM

## 2017-11-18 DIAGNOSIS — G8929 Other chronic pain: Secondary | ICD-10-CM | POA: Diagnosis not present

## 2017-11-18 DIAGNOSIS — M5442 Lumbago with sciatica, left side: Secondary | ICD-10-CM

## 2017-11-18 DIAGNOSIS — M9902 Segmental and somatic dysfunction of thoracic region: Secondary | ICD-10-CM | POA: Diagnosis not present

## 2017-11-18 NOTE — Progress Notes (Signed)
PROCEDURE NOTE : OSTEOPATHIC MANIPULATION The decision today to treat with Osteopathic Manipulative Therapy (OMT) was based on physical exam findings. Verbal consent was obtained following a discussion with the patient regarding the of risks, benefits and potential side effects, including an acute pain flare,post manipulation soreness and need for repeat treatments.     Contraindications to OMT: NONE  Manipulation was performed as below: Regions treated: Thoracic spine, Ribs, Lumbar spine, Pelvis and Sacrum OMT Techniques Used: HVLA, muscle energy, myofascial release, articulatory and facilitated positional release  The patient tolerated the treatment well and reported Improved symptoms following treatment today. Patient was given medications, exercises, stretches and lifestyle modifications per AVS and verbally.   OSTEOPATHIC/STRUCTURAL EXAM:   T8 -10 Neutral, Rotated LEFT, Sidebent RIGHT Rib 6 Left  Posterior L5 FRS left (Flexed, Rotated & Sidebent) Right anterior innonimate L on L sacral torsion

## 2017-11-18 NOTE — Progress Notes (Signed)
Joshua [name]. Joshua [name], [facility] [facility] at [place]  Joshua [name] - 77 y.o. male MRN [id number]  Date of birth: 12-24-1940  Visit Date: 11/18/2017  PCP: Joshua Olp, MD   Referred by: Joshua Olp, MD  Scribe(s) for today's visit: Joshua [name], CMA  SUBJECTIVE:  Joshua [name] "Joshua [name]" is here for Follow-up (LBP)   05/27/17: His thoracic and lumbar pain symptoms INITIALLY: Began a week prior to presentation after playing pickle ball. He felt pain on the R side of his back. A few days later he lifted a heavy bag a cat liter and felt extreme pain in his lower back. He also notes that he did planks on Monday. Push ups have flared up back pain in the past. Described as severe aching, radiating to L leg and gluteal region Worsened with movement, sit-to-stand, walking Improved with rest Additional associated symptoms include:  He denies groin pain and neck pain.  At this time symptoms are improving compared to onset, pain is a little less severe and doesn't extend as far.  He has been taking Diazepam at bedtime to help with sleep. Rx was originally for back pain after spinal surgery. He has tried taking Tylenol with some relief. He has been using heat with some relief.   06/07/17: Compared to the last office visit, his previously described symptoms show no change.  Current symptoms are moderate/severe & are radiating to RT side. He denies radiating pain into legs.  He tried a steroid dose pack and did get some relief while on the medication. He has tried taking Tylenol and Diazepam with some relief.   06/21/17: Compared to the last office visit, his previously described symptoms are worsening, having left-sided lower back pain. He was standing and rotated at the hips Saturday and has had severe pain since then.  LBP is constant. He feels like pain is related to abnormal gait.  Current symptoms are moderate/severe & are radiating  to L gluteal region.  He has been taking Tylenol prn.   07/02/2017: Compared to the last office visit, his previously described symptoms show no change Current symptoms are moderate & are radiating to L gluteal region He has been taking Tylenol prn with minimal relief.   08/26/2017: Compared to the last office visit on 07/02/17, his previously described low back pain symptoms are improved from previously but is still in pain w/ walking and standing.  He is walking x 30 min/day for exercise purposes.  He states that his R hand is "really bad" right now and cannot fully close/flex his R ring finger. Current symptoms are moderate & are radiating to L distal thigh (pain and N/T) He has been taking Tylenol prn for pain.  Pt saw Dr. [name] on 07/19/17 and had an L4-5 epidural. Had an L-spine XR on 05/27/17 and an MRI 07/01/17  09/06/2017: Compared to the last office visit, his previously described symptoms are worsening, midline LBP. L leg pain has improved since he last saw Dr. [name].  Current symptoms are moderate-severe & are radiating to the hips. 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  He has tried taking Tylenol in the past prn for the pain.   10/07/17: Compared to the last office visit, his previously described LBP and L leg weakness symptoms show no change.  He states that he is still having difficulty walking and standing. Current symptoms are moderate & are radiating to B legs, mainly  the L LE.  Today he is having most of his radiating pain into the L SIJ/glutes. He was prescribed a prednisone taper at his last visit and takes Tylenol prn.  He had a facet injection w/ Dr. [name] on 09/13/17 f/b an epidural injection on 09/27/17 which he did not respond well to.  11/04/2017: Compared to the last office visit on 10/07/17, his previously described LBP symptoms are improving w/ less overall pain.  He states that his L hip is bothering him the most today. Current symptoms are moderate & are  radiating to the L hip. He has been taking Tylenol prn.  He had a facet injection w/ Dr. [name] on 09/13/17 f/b an epidural injection on 09/27/17 which he did not respond well to.  He has been doing dry needling w/ Joshua [name] at [facility]. L-spine MRI - 07/01/17  11/18/2017: Compared to the last office visit, his previously described symptoms are improving. L hip pain has improved.  Current symptoms are mild-moderate & are radiating to the R hip/leg.  He has been taking Tylenol prn. He is still seeing Joshua [name] for dry needling.    REVIEW OF SYSTEMS: Denies night time disturbances. Denies fevers, chills, or night sweats. Denies unexplained weight loss. Reports personal history of cancer. Denies changes in bowel or bladder habits. Denies recent unreported falls. Denies new or worsening dyspnea or wheezing. Reports headaches.  Reports numbness, tingling or weakness  In the extremities.  Denies dizziness or presyncopal episodes Denies lower extremity edema    HISTORY & PERTINENT PRIOR DATA:  Significant/pertinent history, findings, studies include:  reports that he quit smoking about 25 years ago. His smoking use included cigarettes. He has a 35.00 pack-year smoking history. He has never used smokeless tobacco. Recent Labs    10/01/17 1703  HGBA1C 5.8*   No specialty comments available. Problem  Chronic Bilateral Low Back Pain With Left-Sided Sciatica    Otherwise prior history reviewed and updated per electronic medical record.    OBJECTIVE:  VS:  HT:5\' 6"  (167.6 cm)   WT:215 lb 12.8 oz (97.9 kg)  BMI:34.85    BP:120/72  HR:81bpm  TEMP: ( )  RESP:96 %   PHYSICAL EXAM: CONSTITUTIONAL: Well-developed, Well-nourished and In no acute distress Alert & appropriately interactive. and Not depressed or anxious appearing. RESPIRATORY: No increased work of breathing and Trachea Midline EYES: Pupils are equal., EOM intact without nystagmus. and No scleral  icterus.  Lower extremities: Warm and well perfused Pulses: DP Pulses: Bilaterally normal and symmetric PT Pulses: Bilaterally normal and symmetric Edema: No Pre-tibial edema and No significant swelling or edema NEURO: unremarkable Normal associated myotomal distribution strength to manual muscle testing  MSK Exam: Bilateral lower extremities overall well aligned.  His sit to stand test is significantly improved.  Walks with a much more erect gait.  He denies improved strength with medial muscle testing and lower extremity myotomes without focal weakness.  Negative neural tension signs today.:   PROCEDURES & DATA REVIEWED:  . Osteopathic manipulation was performed today based on physical exam findings.  Please see procedure note for further information including Osteopathic Exam findings  ASSESSMENT   1. Chronic bilateral low back pain with left-sided sciatica   2. Somatic dysfunction of thoracic region   3. Somatic dysfunction of lumbar region   4. Somatic dysfunction of rib cage region   5. Somatic dysfunction of pelvis region   6. Somatic dysfunction of sacral region   7. Lumbar spondylosis  PLAN:       . Continue home therapeutic exercises and acupuncture.  He is having significant improvements with this. Kermit Balo response once again osteopathic ambulation especially along the lateral release. [name] Please see procedure section and notes. No problem-specific Assessment & Plan notes found for this encounter.   Follow-up: Return in about 3 weeks (around 12/09/2017) for consideration of repeat Osteopathic Manipulation.      Please see additional documentation for Objective, Assessment and Plan sections. Pertinent additional documentation may be included in corresponding procedure notes, imaging studies, problem based documentation and patient instructions. Please see these sections of the encounter for additional information regarding this visit.  CMA/ATC served as Education administrator  during this visit. History, Physical, and Plan performed by medical provider. Documentation and orders reviewed and attested to.      Gerda Diss, [facility] Physician

## 2017-11-23 ENCOUNTER — Other Ambulatory Visit: Payer: Self-pay | Admitting: Family Medicine

## 2017-11-26 DIAGNOSIS — G4733 Obstructive sleep apnea (adult) (pediatric): Secondary | ICD-10-CM | POA: Diagnosis not present

## 2017-11-29 DIAGNOSIS — J301 Allergic rhinitis due to pollen: Secondary | ICD-10-CM | POA: Diagnosis not present

## 2017-11-29 DIAGNOSIS — J3089 Other allergic rhinitis: Secondary | ICD-10-CM | POA: Diagnosis not present

## 2017-11-29 DIAGNOSIS — J3081 Allergic rhinitis due to animal (cat) (dog) hair and dander: Secondary | ICD-10-CM | POA: Diagnosis not present

## 2017-12-07 ENCOUNTER — Ambulatory Visit: Payer: PPO | Admitting: Sports Medicine

## 2017-12-13 ENCOUNTER — Ambulatory Visit (INDEPENDENT_AMBULATORY_CARE_PROVIDER_SITE_OTHER): Payer: PPO | Admitting: Sports Medicine

## 2017-12-13 ENCOUNTER — Encounter: Payer: Self-pay | Admitting: Sports Medicine

## 2017-12-13 VITALS — BP 122/84 | HR 83 | Ht 66.0 in | Wt 216.4 lb

## 2017-12-13 DIAGNOSIS — M9905 Segmental and somatic dysfunction of pelvic region: Secondary | ICD-10-CM

## 2017-12-13 DIAGNOSIS — R29898 Other symptoms and signs involving the musculoskeletal system: Secondary | ICD-10-CM | POA: Diagnosis not present

## 2017-12-13 DIAGNOSIS — M9908 Segmental and somatic dysfunction of rib cage: Secondary | ICD-10-CM

## 2017-12-13 DIAGNOSIS — M9903 Segmental and somatic dysfunction of lumbar region: Secondary | ICD-10-CM

## 2017-12-13 DIAGNOSIS — M24559 Contracture, unspecified hip: Secondary | ICD-10-CM | POA: Diagnosis not present

## 2017-12-13 DIAGNOSIS — M9904 Segmental and somatic dysfunction of sacral region: Secondary | ICD-10-CM | POA: Diagnosis not present

## 2017-12-13 DIAGNOSIS — M9902 Segmental and somatic dysfunction of thoracic region: Secondary | ICD-10-CM

## 2017-12-13 NOTE — Progress Notes (Signed)
Joshua [name]. Joshua [name], [facility] at [place]  Joshua [name] - 77 y.o. male MRN [id number]  Date of birth: [birth year]  Visit Date: 12/13/2017  PCP: Joshua Olp, MD   Referred by: Joshua Olp, MD  Scribe(s) for today's visit: [name], CMA  SUBJECTIVE:  Joshua Messing "[name]" is here for Follow-up (LBP)  HPI: 05/27/17: His thoracic and lumbar pain symptoms INITIALLY: Began a week prior to presentation after playing pickle ball. He felt pain on the R side of his back. A few days later he lifted a heavy bag a cat liter and felt extreme pain in his lower back. He also notes that he did planks on Monday. Push ups have flared up back pain in the past. Described as severe aching, radiating to L leg and gluteal region Worsened with movement, sit-to-stand, walking Improved with rest Additional associated symptoms include:  He denies groin pain and neck pain.  At this time symptoms are improving compared to onset, pain is a little less severe and doesn't extend as far.  He has been taking Diazepam at bedtime to help with sleep. Rx was originally for back pain after spinal surgery. He has tried taking Tylenol with some relief. He has been using heat with some relief.   06/07/17: Compared to the last office visit, his previously described symptoms show no change.  Current symptoms are moderate/severe & are radiating to RT side. He denies radiating pain into legs.  He tried a steroid dose pack and did get some relief while on the medication. He has tried taking Tylenol and Diazepam with some relief.   06/21/17: Compared to the last office visit, his previously described symptoms are worsening, having left-sided lower back pain. He was standing and rotated at the hips Saturday and has had severe pain since then.  LBP is constant. He feels like pain is related to abnormal gait.  Current symptoms are moderate/severe & are  radiating to L gluteal region.  He has been taking Tylenol prn.   07/02/2017: Compared to the last office visit, his previously described symptoms show no change Current symptoms are moderate & are radiating to L gluteal region He has been taking Tylenol prn with minimal relief.   08/26/2017: Compared to the last office visit on 07/02/17, his previously described low back pain symptoms are improved from previously but is still in pain w/ walking and standing.  He is walking x 30 min/day for exercise purposes.  He states that his R hand is "really bad" right now and cannot fully close/flex his R ring finger. Current symptoms are moderate & are radiating to L distal thigh (pain and N/T) He has been taking Tylenol prn for pain.  Pt saw Dr. [name] on 07/19/17 and had an L4-5 epidural. Had an L-spine XR on 05/27/17 and an MRI 07/01/17  09/06/2017: Compared to the last office visit, his previously described symptoms are worsening, midline LBP. L leg pain has improved since he last saw Dr. [name].  Current symptoms are moderate-severe & are radiating to the hips. 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  He has tried taking Tylenol in the past prn for the pain.   10/07/17: Compared to the last office visit, his previously described LBP and L leg weakness symptoms show no change.  He states that he is still having difficulty walking and standing. Current symptoms are moderate & are radiating to B legs, mainly  the L LE.  Today he is having most of his radiating pain into the L SIJ/glutes. He was prescribed a prednisone taper at his last visit and takes Tylenol prn.  He had a facet injection w/ Dr. [name] on 09/13/17 f/b an epidural injection on 09/27/17 which he did not respond well to.  11/04/2017: Compared to the last office visit on 10/07/17, his previously described LBP symptoms are improving w/ less overall pain.  He states that his L hip is bothering him the most today. Current symptoms are  moderate & are radiating to the L hip. He has been taking Tylenol prn.  He had a facet injection w/ Dr. [name] on 09/13/17 f/b an epidural injection on 09/27/17 which he did not respond well to.  He has been doing dry needling w/ [name] at [facility]. L-spine MRI - 07/01/17  11/18/2017: Compared to the last office visit, his previously described symptoms are improving. L hip pain has improved.  Current symptoms are mild-moderate & are radiating to the R hip/leg.  He has been taking Tylenol prn. He is still seeing Rob for dry needling.   12/13/2017: Compared to the last office visit, his previously described symptoms are improving. Sx began to flare up again after trying to install and electrical box in the ceiling.  Current symptoms are moderate & are non-radiating. He will have periodic flare ups or radiating pain in both legs.  He has been taking Tylenol prn, receiving dry needling, and doing HEP with some relief.     REVIEW OF SYSTEMS: Denies night time disturbances. Denies fevers, chills, or night sweats. Denies unexplained weight loss. Reports personal history of cancer. Reports changes in bowel habits, increased frequency and amount.  Increased stool volume and frequency but no incontinence reported. Denies recent unreported falls. Denies new or worsening dyspnea or wheezing. Reports headaches.  Reports numbness, tingling or weakness  In the extremities.  Denies dizziness or presyncopal episodes Denies lower extremity edema   HISTORY:  Prior history reviewed and updated per electronic medical record.  Social History   Occupational History  . Occupation: retired    Fish farm manager: RETIRED  Tobacco Use  . Smoking status: Former Smoker    Packs/day: 1.00    Years: 35.00    Pack years: 35.00    Types: Cigarettes    Last attempt to quit: 06/16/1992    Years since quitting: 25.5  . Smokeless tobacco: Never Used  . Tobacco comment: discussed the [employer] but thinks he  may have had this   Substance and Sexual Activity  . Alcohol use: Yes    Alcohol/week: 4.0 standard drinks    Types: 4 Shots of liquor per week    Comment: mixed drink  . Drug use: No  . Sexual activity: Never   Social History   Social History Narrative   Married 42 years in 2015. No kids (mumps at age 50)      Retired from [employer] in [place] at [place]: poker, movies and tv, staying active    [place]:   Recent Labs    10/01/17 1703  HGBA1C 5.8*    MRI 07/01/2017: 1. 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 2. L4-5 advanced spinal stenosis. Moderate right more than left foraminal narrowing. 3. L5-S1 moderate left and mild right foraminal narrowing. Left subarticular recess narrowing  that could affect the left S1 nerve root. 4. Mild levoscoliosis. .    OBJECTIVE:  VS:  HT:5\' 6"  (167.6 cm)   WT:216 lb 6.4 oz (98.2 kg)  BMI:34.94    BP:122/84  HR:83bpm  TEMP: ( )  RESP:96 %   PHYSICAL EXAM: CONSTITUTIONAL: Well-developed, Well-nourished and In no acute distress Alert & appropriately interactive. and Not depressed or anxious appearing. RESPIRATORY: No increased work of breathing and Trachea Midline EYES: Pupils are equal., EOM intact without nystagmus. and No scleral icterus.  Lower extremities: EXTREMITY EXAM: Warm and well perfused Pulses: DP Pulses: Bilaterally normal and symmetric PT Pulses: Bilaterally normal and symmetric Edema: No Pre-tibial edema and No significant swelling or edema NEURO: unremarkable Normal associated myotomal distribution strength to manual muscle testing  MSK Exam: Back  Well aligned, no significant deformity. No overlying skin changes. TTP over Bilateral paraspinal muscles.  Improved tissue texture changes compared to in the past.   RANGE OF MOTION & STRENGTH  Bilateral hip flexor  contractures.  Improved lumbar rotation in sidebending with osteopathic manipulation but limited in all planes.   SPECIALITY TESTING:  Negative straight leg raise bilaterally.  He is able to heel and toe walk but walks with a wide-based gait.     ASSESSMENT   1. Weakness of left leg   2. Somatic dysfunction of thoracic region   3. Somatic dysfunction of lumbar region   4. Somatic dysfunction of rib cage region   5. Somatic dysfunction of pelvis region   6. Somatic dysfunction of sacral region   7. Hip flexor tightness, unspecified laterality     PLAN:  Pertinent additional documentation may be included in corresponding procedure notes, imaging studies, problem based documentation and patient instructions.  Procedures:  . Osteopathic manipulation was performed today based on physical exam findings.  Please see procedure note for further information including Osteopathic Exam findings  Medications:  No orders of the defined types were placed in this encounter.  Discussion/Instructions: No problem-specific Assessment & Plan notes found for this encounter.  [name] Ultimately his low back pain is multifactorial and likely related to both spinal stenosis is causing some the left leg pain and weakness as well as lumbar spondylosis is likely causing the majority of his axial back pain.  He has tried multiple interventions including injections, topical analgesics and thermic care products without significant benefit.  Most beneficial things have been acupuncture and osteopathic manipulation. . Discussed red flag symptoms that warrant earlier emergent evaluation and patient voices understanding. . Activity modifications and the importance of avoiding exacerbating activities (limiting pain to no more than a 4 / 10 during or following activity) recommended and discussed.  Follow-up:  . Return in about 2 weeks (around 12/27/2017) for consideration of repeat Osteopathic Manipulation.   . If any lack  of improvement consider: referral to Physical Therapy and Could consider lumbar facet injections and RFA but he is not interested in this at this time given lack of efficacy with prior injections.  . At follow up will plan to consider: repeat osteopathic manipulation     CMA/ATC served as scribe during this visit. History, Physical, and Plan performed by medical provider. Documentation and orders reviewed and attested to.      Gerda Diss, [facility] Physician

## 2017-12-13 NOTE — Progress Notes (Signed)
PROCEDURE NOTE : OSTEOPATHIC MANIPULATION The decision today to treat with Osteopathic Manipulative Therapy (OMT) was based on physical exam findings. Verbal consent was obtained following a discussion with the patient regarding the of risks, benefits and potential side effects, including an acute pain flare,post manipulation soreness and need for repeat treatments.     Contraindications to OMT: NONE  Manipulation was performed as below: Regions Treated OMT Techniques Used  Thoracic spine Ribs Lumbar spine Pelvis Sacrum HVLA muscle energy myofascial release   The patient tolerated the treatment well and reported Improved symptoms following treatment today. Patient was given medications, exercises, stretches and lifestyle modifications per AVS and verbally.   OSTEOPATHIC/STRUCTURAL EXAM:   T8 -10 Neutral, Rotated LEFT, Sidebent RIGHT Rib 6 Left  Posterior L3 FRS right (Flexed, Rotated & Sidebent) L4 FRS left (Flexed, Rotated & Sidebent) Right anterior innonimate L on L sacral torsion

## 2017-12-14 DIAGNOSIS — J3089 Other allergic rhinitis: Secondary | ICD-10-CM | POA: Diagnosis not present

## 2017-12-14 DIAGNOSIS — J301 Allergic rhinitis due to pollen: Secondary | ICD-10-CM | POA: Diagnosis not present

## 2017-12-14 DIAGNOSIS — J3081 Allergic rhinitis due to animal (cat) (dog) hair and dander: Secondary | ICD-10-CM | POA: Diagnosis not present

## 2017-12-27 ENCOUNTER — Encounter: Payer: Self-pay | Admitting: Sports Medicine

## 2017-12-27 ENCOUNTER — Ambulatory Visit (INDEPENDENT_AMBULATORY_CARE_PROVIDER_SITE_OTHER): Payer: PPO | Admitting: Sports Medicine

## 2017-12-27 VITALS — BP 140/84 | HR 78 | Ht 66.0 in | Wt 217.8 lb

## 2017-12-27 DIAGNOSIS — M9903 Segmental and somatic dysfunction of lumbar region: Secondary | ICD-10-CM

## 2017-12-27 DIAGNOSIS — M9902 Segmental and somatic dysfunction of thoracic region: Secondary | ICD-10-CM | POA: Diagnosis not present

## 2017-12-27 DIAGNOSIS — M47816 Spondylosis without myelopathy or radiculopathy, lumbar region: Secondary | ICD-10-CM | POA: Diagnosis not present

## 2017-12-27 DIAGNOSIS — G8929 Other chronic pain: Secondary | ICD-10-CM | POA: Diagnosis not present

## 2017-12-27 DIAGNOSIS — R29898 Other symptoms and signs involving the musculoskeletal system: Secondary | ICD-10-CM | POA: Diagnosis not present

## 2017-12-27 DIAGNOSIS — G4733 Obstructive sleep apnea (adult) (pediatric): Secondary | ICD-10-CM | POA: Diagnosis not present

## 2017-12-27 DIAGNOSIS — J3081 Allergic rhinitis due to animal (cat) (dog) hair and dander: Secondary | ICD-10-CM | POA: Diagnosis not present

## 2017-12-27 DIAGNOSIS — M5442 Lumbago with sciatica, left side: Secondary | ICD-10-CM | POA: Diagnosis not present

## 2017-12-27 DIAGNOSIS — M48062 Spinal stenosis, lumbar region with neurogenic claudication: Secondary | ICD-10-CM | POA: Diagnosis not present

## 2017-12-27 DIAGNOSIS — M9905 Segmental and somatic dysfunction of pelvic region: Secondary | ICD-10-CM | POA: Diagnosis not present

## 2017-12-27 DIAGNOSIS — J301 Allergic rhinitis due to pollen: Secondary | ICD-10-CM | POA: Diagnosis not present

## 2017-12-27 DIAGNOSIS — J3089 Other allergic rhinitis: Secondary | ICD-10-CM | POA: Diagnosis not present

## 2017-12-27 NOTE — Progress Notes (Signed)
[name] [name], [place] at [place]  JADE BURRIGHT - 77 y.o. male MRN [id number]  Date of birth: 07-08-1940  Visit Date: 12/27/2017  PCP: [name], MD   Referred by: [name], MD  Scribe(s) for today's visit: [name], CMA  SUBJECTIVE:  Joshua Messing "[name]" is here for Follow-up (LBP, L leg weakness, hip flexor tightness)  HPI: 05/27/17: His thoracic and lumbar pain symptoms INITIALLY: Began a week prior to presentation after playing pickle ball. He felt pain on the R side of his back. A few days later he lifted a heavy bag a cat liter and felt extreme pain in his lower back. He also notes that he did planks on Monday. Push ups have flared up back pain in the past. Described as severe aching, radiating to L leg and gluteal region Worsened with movement, sit-to-stand, walking Improved with rest Additional associated symptoms include:  He denies groin pain and neck pain.  At this time symptoms are improving compared to onset, pain is a little less severe and doesn't extend as far.  He has been taking Diazepam at bedtime to help with sleep. Rx was originally for back pain after spinal surgery. He has tried taking Tylenol with some relief. He has been using heat with some relief.   06/07/17: Compared to the last office visit, his previously described symptoms show no change.  Current symptoms are moderate/severe & are radiating to RT side. He denies radiating pain into legs.  He tried a steroid dose pack and did get some relief while on the medication. He has tried taking Tylenol and Diazepam with some relief.   06/21/17: Compared to the last office visit, his previously described symptoms are worsening, having left-sided lower back pain. He was standing and rotated at the hips Saturday and has had severe pain since then.  LBP is constant. He feels like pain is related to abnormal gait.  Current  symptoms are moderate/severe & are radiating to L gluteal region.  He has been taking Tylenol prn.   07/02/2017: Compared to the last office visit, his previously described symptoms show no change Current symptoms are moderate & are radiating to L gluteal region He has been taking Tylenol prn with minimal relief.   08/26/2017: Compared to the last office visit on 07/02/17, his previously described low back pain symptoms are improved from previously but is still in pain w/ walking and standing.  He is walking x 30 min/day for exercise purposes.  He states that his R hand is "really bad" right now and cannot fully close/flex his R ring finger. Current symptoms are moderate & are radiating to L distal thigh (pain and N/T) He has been taking Tylenol prn for pain.  Pt saw Dr. [name] on 07/19/17 and had an L4-5 epidural. Had an L-spine XR on 05/27/17 and an MRI 07/01/17  09/06/2017: Compared to the last office visit, his previously described symptoms are worsening, midline LBP. L leg pain has improved since he last saw Dr. [name].  Current symptoms are moderate-severe & are radiating to the hips. 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  He has tried taking Tylenol in the past prn for the pain.   10/07/17: Compared to the last office visit, his previously described LBP and L leg weakness symptoms show no change.  He states that he is still having difficulty walking and standing. Current symptoms are moderate &  are radiating to B legs, mainly the L LE.  Today he is having most of his radiating pain into the L SIJ/glutes. He was prescribed a prednisone taper at his last visit and takes Tylenol prn.  He had a facet injection w/ Dr. [name] on 09/13/17 f/b an epidural injection on 09/27/17 which he did not respond well to.  11/04/2017: Compared to the last office visit on 10/07/17, his previously described LBP symptoms are improving w/ less overall pain.  He states that his L hip is bothering him the most  today. Current symptoms are moderate & are radiating to the L hip. He has been taking Tylenol prn.  He had a facet injection w/ Dr. [name] on 09/13/17 f/b an epidural injection on 09/27/17 which he did not respond well to.  He has been doing dry needling w/ [name] at [place]. L-spine MRI - 07/01/17  11/18/2017: Compared to the last office visit, his previously described symptoms are improving. L hip pain has improved.  Current symptoms are mild-moderate & are radiating to the R hip/leg.  He has been taking Tylenol prn. He is still seeing Rob for dry needling.   12/13/2017: Compared to the last office visit, his previously described symptoms are improving. Sx began to flare up again after trying to install and electrical box in the ceiling.  Current symptoms are moderate & are non-radiating. He will have periodic flare ups or radiating pain in both legs.  He has been taking Tylenol prn, receiving dry needling, and doing HEP with some relief.   12/27/2017: Compared to the last office visit, his previously described symptoms are worsening. He started doing some exercises that he got after his prostate surgery and sx have started to flare up since then. Pain yesterday was about 8/10. Today pain is about 5/10.  Current symptoms are moderate & are radiating to the L leg. 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  He has been taking Tylenol prn but rarely. His pain was so bad yesterday that he almost felt the need to take a Tramadol.       REVIEW OF SYSTEMS: Reports night time disturbances. Denies fevers, chills, or night sweats. Denies unexplained weight loss. Reports personal history of cancer. Reports changes in bowel habits, increased frequency and amount.  Increased stool volume and frequency but no incontinence reported. Denies recent unreported falls. Denies new or worsening dyspnea or wheezing. Reports headaches.  Reports  numbness, tingling or weakness  In the extremities.  Denies dizziness or presyncopal episodes Denies lower extremity edema   HISTORY:  Prior history reviewed and updated per electronic medical record.  Social History   Occupational History  . Occupation: retired    Fish farm manager: RETIRED  Tobacco Use  . Smoking status: Former Smoker    Packs/day: 1.00    Years: 35.00    Pack years: 35.00    Types: Cigarettes    Last attempt to quit: 06/16/1992    Years since quitting: 25.5  . Smokeless tobacco: Never Used  . Tobacco comment: discussed the AAA but thinks he may have had this   Substance and Sexual Activity  . Alcohol use: Yes    Alcohol/week: 4.0 standard drinks    Types: 4 Shots of liquor per week    Comment: mixed drink  . Drug use: No  . Sexual activity: Never   Social History   Social History Narrative   Married 42  years in 2015. No kids (mumps at age 83)      Retired from [employer] in [place] at [place]: poker, movies and tv, staying active    [place]:   Recent Labs    10/01/17 1703  HGBA1C 5.8*    MRI 07/01/2017: 1. 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 2. L4-5 advanced spinal stenosis. Moderate right more than left foraminal narrowing. 3. L5-S1 moderate left and mild right foraminal narrowing. Left subarticular recess narrowing that could affect the left S1 nerve root. 4. Mild levoscoliosis. .    OBJECTIVE:  VS:  HT:5\' 6"  (167.6 cm)   WT:217 lb 12.8 oz (98.8 kg)  BMI:35.17    BP:140/84  HR:78bpm  TEMP: ( )  RESP:97 %   PHYSICAL EXAM: CONSTITUTIONAL: Well-developed, Well-nourished and In no acute distress Psychiatric: Alert & appropriately interactive. and Not depressed or anxious appearing. RESPIRATORY: No increased work of breathing and Trachea Midline EYES: Pupils are equal., EOM intact without nystagmus. and  No scleral icterus.  Lower extremities: EXTREMITY EXAM: Warm and well perfused Edema: No Pre-tibial edema and No significant swelling or edema NEURO: unremarkable Normal associated myotomal distribution strength to manual muscle testing  MSK Exam: Back  Well aligned, no significant deformity. No overlying skin changes. TTP over Mild bilateral paraspinal muscles.  Continued improvement in tissue texture changes compared to in the past.   RANGE OF MOTION & STRENGTH  Bilateral hip flexor contractures.  Improved lumbar rotation in sidebending with osteopathic manipulation but limited in all planes.   SPECIALITY TESTING:  Negative straight leg raise bilaterally.  He is able to heel and toe walk but walks with a wide-based gait.     ASSESSMENT   1. Weakness of left leg   2. Somatic dysfunction of thoracic region   3. Somatic dysfunction of lumbar region   4. Somatic dysfunction of pelvis region   5. Lumbar spondylosis   6. Chronic bilateral low back pain with left-sided sciatica   7. Spinal stenosis, lumbar region, with neurogenic claudication     PLAN:  Pertinent additional documentation may be included in corresponding procedure notes, imaging studies, problem based documentation and patient instructions.  Procedures:  . Osteopathic manipulation was performed today based on physical exam findings.  Please see procedure note for further information including Osteopathic Exam findings  Medications:  No orders of the defined types were placed in this encounter.  Discussion/Instructions: No problem-specific Assessment & Plan notes found for this encounter.  [name] Ultimately his low back pain is multifactorial and likely related to both spinal stenosis is causing some the left leg pain and weakness as well as lumbar spondylosis is likely causing the majority of his axial back pain.  He has tried multiple interventions including injections, topical analgesics and thermic care products  without significant benefit.  Most beneficial things have been acupuncture and osteopathic manipulation. . Discussed red flag symptoms that warrant earlier emergent evaluation and patient voices understanding. . Activity modifications and the importance of avoiding exacerbating activities (limiting pain to no more than a 4 / 10 during or following activity) recommended and discussed.  Follow-up:  . Return in about 2 weeks (around 01/10/2018).   . If any lack of improvement consider: referral to Physical Therapy and Could consider lumbar facet injections and RFA but he is not interested in this at this  time given lack of efficacy with prior injections.  . At follow up will plan to consider: repeat osteopathic manipulation     CMA/ATC served as scribe during this visit. History, Physical, and Plan performed by medical provider. Documentation and orders reviewed and attested to.      Gerda Diss, [facility] Physician

## 2017-12-27 NOTE — Progress Notes (Signed)
PROCEDURE NOTE : OSTEOPATHIC MANIPULATION The decision today to treat with Osteopathic Manipulative Therapy (OMT) was based on physical exam findings. Verbal consent was obtained following a discussion with the patient regarding the of risks, benefits and potential side effects, including an acute pain flare,post manipulation soreness and need for repeat treatments.     Contraindications to OMT: NONE  Manipulation was performed as below: Regions Treated OMT Techniques Used  Thoracic spine Lumbar spine Pelvis HVLA muscle energy myofascial release   The patient tolerated the treatment well and reported Improved symptoms following treatment today. Patient was given medications, exercises, stretches and lifestyle modifications per AVS and verbally.   OSTEOPATHIC/STRUCTURAL EXAM:   T8 -10 Neutral, Rotated LEFT, Sidebent RIGHT L3 FRS right (Flexed, Rotated & Sidebent) L4 FRS left (Flexed, Rotated & Sidebent) Right anterior innonimate

## 2018-01-03 DIAGNOSIS — J3081 Allergic rhinitis due to animal (cat) (dog) hair and dander: Secondary | ICD-10-CM | POA: Diagnosis not present

## 2018-01-06 DIAGNOSIS — J4530 Mild persistent asthma, uncomplicated: Secondary | ICD-10-CM | POA: Diagnosis not present

## 2018-01-06 DIAGNOSIS — J3081 Allergic rhinitis due to animal (cat) (dog) hair and dander: Secondary | ICD-10-CM | POA: Diagnosis not present

## 2018-01-06 DIAGNOSIS — J3089 Other allergic rhinitis: Secondary | ICD-10-CM | POA: Diagnosis not present

## 2018-01-06 DIAGNOSIS — J301 Allergic rhinitis due to pollen: Secondary | ICD-10-CM | POA: Diagnosis not present

## 2018-01-10 ENCOUNTER — Encounter: Payer: Self-pay | Admitting: Sports Medicine

## 2018-01-10 ENCOUNTER — Ambulatory Visit (INDEPENDENT_AMBULATORY_CARE_PROVIDER_SITE_OTHER): Payer: PPO | Admitting: Sports Medicine

## 2018-01-10 VITALS — BP 122/76 | HR 88 | Ht 66.0 in | Wt 217.0 lb

## 2018-01-10 DIAGNOSIS — M9905 Segmental and somatic dysfunction of pelvic region: Secondary | ICD-10-CM | POA: Diagnosis not present

## 2018-01-10 DIAGNOSIS — G8929 Other chronic pain: Secondary | ICD-10-CM | POA: Diagnosis not present

## 2018-01-10 DIAGNOSIS — M5442 Lumbago with sciatica, left side: Secondary | ICD-10-CM | POA: Diagnosis not present

## 2018-01-10 DIAGNOSIS — J3081 Allergic rhinitis due to animal (cat) (dog) hair and dander: Secondary | ICD-10-CM | POA: Diagnosis not present

## 2018-01-10 DIAGNOSIS — M9903 Segmental and somatic dysfunction of lumbar region: Secondary | ICD-10-CM

## 2018-01-10 DIAGNOSIS — M9902 Segmental and somatic dysfunction of thoracic region: Secondary | ICD-10-CM | POA: Diagnosis not present

## 2018-01-10 DIAGNOSIS — M9904 Segmental and somatic dysfunction of sacral region: Secondary | ICD-10-CM | POA: Diagnosis not present

## 2018-01-10 DIAGNOSIS — M47816 Spondylosis without myelopathy or radiculopathy, lumbar region: Secondary | ICD-10-CM

## 2018-01-10 DIAGNOSIS — M9908 Segmental and somatic dysfunction of rib cage: Secondary | ICD-10-CM

## 2018-01-10 DIAGNOSIS — J301 Allergic rhinitis due to pollen: Secondary | ICD-10-CM | POA: Diagnosis not present

## 2018-01-10 DIAGNOSIS — J3089 Other allergic rhinitis: Secondary | ICD-10-CM | POA: Diagnosis not present

## 2018-01-10 NOTE — Patient Instructions (Signed)
Please perform the exercise program that we have prepared for you and gone over in detail on a daily basis.  In addition to the handout you were provided you can access your program through: [email or web address]   Your unique program code is: [email or web address]

## 2018-01-10 NOTE — Progress Notes (Signed)
Joshua [name]. [name], [name] at [place]  Joshua [name] - 77 y.o. male MRN [id number]  Date of birth: 02-19-1941  Visit Date: 01/10/2018  PCP: [name], MD   Referred by: [name], MD  Scribe(s) for today's visit: [name], CMA  SUBJECTIVE:  Joshua [name] "Joshua [name]" is here for Follow-up (LBP, leg weakness)  HPI: 05/27/17: His thoracic and lumbar pain symptoms INITIALLY: Began a week prior to presentation after playing pickle ball. He felt pain on the R side of his back. A few days later he lifted a heavy bag a cat liter and felt extreme pain in his lower back. He also notes that he did planks on Monday. Push ups have flared up back pain in the past. Described as severe aching, radiating to L leg and gluteal region Worsened with movement, sit-to-stand, walking Improved with rest Additional associated symptoms include:  He denies groin pain and neck pain.  At this time symptoms are improving compared to onset, pain is a little less severe and doesn't extend as far.  He has been taking Diazepam at bedtime to help with sleep. Rx was originally for back pain after spinal surgery. He has tried taking Tylenol with some relief. He has been using heat with some relief.   06/07/17: Compared to the last office visit, his previously described symptoms show no change.  Current symptoms are moderate/severe & are radiating to RT side. He denies radiating pain into legs.  He tried a steroid dose pack and did get some relief while on the medication. He has tried taking Tylenol and Diazepam with some relief.   06/21/17: Compared to the last office visit, his previously described symptoms are worsening, having left-sided lower back pain. He was standing and rotated at the hips Saturday and has had severe pain since then.  LBP is constant. He feels like pain is related to abnormal gait.  Current symptoms are  moderate/severe & are radiating to L gluteal region.  He has been taking Tylenol prn.   07/02/2017: Compared to the last office visit, his previously described symptoms show no change Current symptoms are moderate & are radiating to L gluteal region He has been taking Tylenol prn with minimal relief.   08/26/2017: Compared to the last office visit on 07/02/17, his previously described low back pain symptoms are improved from previously but is still in pain w/ walking and standing.  He is walking x 30 min/day for exercise purposes.  He states that his R hand is "really bad" right now and cannot fully close/flex his R ring finger. Current symptoms are moderate & are radiating to L distal thigh (pain and N/T) He has been taking Tylenol prn for pain.  Pt saw Dr. [name] on 07/19/17 and had an L4-5 epidural. Had an L-spine XR on 05/27/17 and an MRI 07/01/17  09/06/2017: Compared to the last office visit, his previously described symptoms are worsening, midline LBP. L leg pain has improved since he last saw Dr. [name].  Current symptoms are moderate-severe & are radiating to the hips. 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  He has tried taking Tylenol in the past prn for the pain.   10/07/17: Compared to the last office visit, his previously described LBP and L leg weakness symptoms show no change.  He states that he is still having difficulty walking and standing. Current symptoms are moderate & are radiating to B  legs, mainly the L LE.  Today he is having most of his radiating pain into the L SIJ/glutes. He was prescribed a prednisone taper at his last visit and takes Tylenol prn.  He had a facet injection w/ Dr. [name] on 09/13/17 f/b an epidural injection on 09/27/17 which he did not respond well to.  11/04/2017: Compared to the last office visit on 10/07/17, his previously described LBP symptoms are improving w/ less overall pain.  He states that his L hip is bothering him the most  today. Current symptoms are moderate & are radiating to the L hip. He has been taking Tylenol prn.  He had a facet injection w/ Dr. [name] on 09/13/17 f/b an epidural injection on 09/27/17 which he did not respond well to.  He has been doing dry needling w/ [name] at [place]. L-spine MRI - 07/01/17  11/18/2017: Compared to the last office visit, his previously described symptoms are improving. L hip pain has improved.  Current symptoms are mild-moderate & are radiating to the R hip/leg.  He has been taking Tylenol prn. He is still seeing Rob for dry needling.   12/13/2017: Compared to the last office visit, his previously described symptoms are improving. Sx began to flare up again after trying to install and electrical box in the ceiling.  Current symptoms are moderate & are non-radiating. He will have periodic flare ups or radiating pain in both legs.  He has been taking Tylenol prn, receiving dry needling, and doing HEP with some relief.   12/27/2017: Compared to the last office visit, his previously described symptoms are worsening. He started doing some exercises that he got after his prostate surgery and sx have started to flare up since then. Pain yesterday was about 8/10. Today pain is about 5/10.  Current symptoms are moderate & are radiating to the L leg. 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  He has been taking Tylenol prn but rarely. His pain was so bad yesterday that he almost felt the need to take a Tramadol.   01/10/2018: Compared to the last office visit on 12/27/17, his previously described low back pain and LE symptoms are improving.  Current symptoms are low moderate & are radiating to L LE (N/T and weakness) He has been taking Tylenol prn.     REVIEW OF SYSTEMS: Denies night time disturbances. Denies fevers, chills, or night sweats. Denies unexplained weight loss. Reports personal history of cancer. Denies  changes in bowel habits, increased frequency and amount. Denies recent unreported falls. Denies new or worsening dyspnea or wheezing. Reports headaches.  Reports numbness, tingling or weakness  In the extremities.  Denies dizziness or presyncopal episodes Denies lower extremity edema   HISTORY:  Prior history reviewed and updated per electronic medical record.  Social History   Occupational History  . Occupation: retired    Fish farm manager: RETIRED  Tobacco Use  . Smoking status: Former Smoker    Packs/day: 1.00    Years: 35.00    Pack years: 35.00    Types: Cigarettes    Last attempt to quit: 06/16/1992    Years since quitting: 25.5  . Smokeless tobacco: Never Used  . Tobacco comment: discussed the AAA but thinks he may have had this   Substance and Sexual Activity  . Alcohol use: Yes    Alcohol/week: 4.0 standard drinks    Types: 4 Shots of liquor per week  Comment: mixed drink  . Drug use: No  . Sexual activity: Never   Social History   Social History Narrative   Married 42 years in 2015. No kids (mumps at age 51)      Retired from [employer] in [place] at [place]: poker, movies and tv, staying active    [place]:   Recent Labs    10/01/17 1703  HGBA1C 5.8*    MRI 07/01/2017: 1. 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 2. L4-5 advanced spinal stenosis. Moderate right more than left foraminal narrowing. 3. L5-S1 moderate left and mild right foraminal narrowing. Left subarticular recess narrowing that could affect the left S1 nerve root. 4. Mild levoscoliosis. .    OBJECTIVE:  VS:  HT:5\' 6"  (167.6 cm)   WT:217 lb (98.4 kg)  BMI:35.04    BP:122/76  HR:88bpm  TEMP: ( )  RESP:95 %   PHYSICAL EXAM: CONSTITUTIONAL: Well-developed, Well-nourished and In no acute distress Psychiatric: Alert & appropriately interactive. and  Not depressed or anxious appearing. RESPIRATORY: No increased work of breathing and Trachea Midline EYES: Pupils are equal., EOM intact without nystagmus. and No scleral icterus.  Lower extremities: EXTREMITY EXAM: Warm and well perfused Edema: No Pre-tibial edema and No significant swelling or edema NEURO: unremarkable Normal associated myotomal distribution strength to manual muscle testing  MSK Exam: Back  Well aligned, no significant deformity. No overlying skin changes. TTP over Mild bilateral paraspinal muscles.  Persistent tightness within the right greater than left paraspinal muscles as well as in the right hip flexor and TFL.   RANGE OF MOTION & STRENGTH  Bilateral hip flexor contractures.  Improved lumbar rotation in sidebending with osteopathic manipulation but limited in all planes.   SPECIALITY TESTING:  Negative straight leg raise bilaterally.  He is able to heel and toe walk but walks with a wide-based gait.     ASSESSMENT   1. Chronic bilateral low back pain with left-sided sciatica   2. Lumbar spondylosis   3. Somatic dysfunction of thoracic region   4. Somatic dysfunction of lumbar region   5. Somatic dysfunction of rib cage region   6. Somatic dysfunction of pelvis region   7. Somatic dysfunction of sacral region     PLAN:  Pertinent additional documentation may be included in corresponding procedure notes, imaging studies, problem based documentation and patient instructions.  Procedures:  . Osteopathic manipulation was performed today based on physical exam findings.  Please see procedure note for further information including Osteopathic Exam findings  Medications:  No orders of the defined types were placed in this encounter.  Discussion/Instructions: No problem-specific Assessment & Plan notes found for this encounter.  [name] Ultimately his low back pain is multifactorial and likely related to both spinal stenosis is causing some the left leg pain  and weakness as well as lumbar spondylosis is likely causing the majority of his axial back pain.  He has tried multiple interventions including injections, topical analgesics and thermic care products without significant benefit.  Most beneficial things have been acupuncture and osteopathic manipulation. . Continue previously prescribed home exercise program.  . Discussed red flag symptoms that warrant earlier emergent evaluation and patient voices understanding. . Activity modifications and the importance of avoiding exacerbating activities (limiting pain to no more than a 4 / 10 during or following activity) recommended and discussed.  Follow-up:  . Return in about 2 weeks (around 01/24/2018).   . If any lack of improvement consider: referral to Physical Therapy and Could consider lumbar facet injections and RFA but he is not interested in this at this time given lack of efficacy with prior injections.  . At follow up will plan to consider: repeat osteopathic manipulation     CMA/ATC served as scribe during this visit. History, Physical, and Plan performed by medical provider. Documentation and orders reviewed and attested to.      Gerda Diss, [facility] Physician

## 2018-01-10 NOTE — Progress Notes (Signed)
PROCEDURE NOTE: THERAPEUTIC EXERCISES (97110) 15 minutes spent for Therapeutic exercises as below and as referenced in the AVS.  This included exercises focusing on stretching, strengthening, with significant focus on eccentric aspects.   Proper technique shown and discussed handout in great detail with ATC.  All questions were discussed and answered.   Long term goals include an improvement in range of motion, strength, endurance as well as avoiding reinjury. Frequency of visits is one time as determined during today's  office visit. Frequency of exercises to be performed is as per handout.  EXERCISES REVIEWED:  Pelvic recruitment/tilting  Pelvic knee rocking.   Keagle's

## 2018-01-10 NOTE — Progress Notes (Signed)
PROCEDURE NOTE : OSTEOPATHIC MANIPULATION The decision today to treat with Osteopathic Manipulative Therapy (OMT) was based on physical exam findings. Verbal consent was obtained following a discussion with the patient regarding the of risks, benefits and potential side effects, including an acute pain flare,post manipulation soreness and need for repeat treatments.     Contraindications to OMT: NONE  Manipulation was performed as below: Regions Treated OMT Techniques Used  Thoracic spine Ribs Lumbar spine Pelvis Sacrum HVLA muscle energy myofascial release soft tissue   The patient tolerated the treatment well and reported Improved symptoms following treatment today. Patient was given medications, exercises, stretches and lifestyle modifications per AVS and verbally.   OSTEOPATHIC/STRUCTURAL EXAM:   T6 -10 Neutral, Rotated RIGHT, Sidebent LEFT Rib 8 Left  Posterior L4 FRS left (Flexed, Rotated & Sidebent) Right psoas spasm Right anterior innonimate L on L sacral torsion

## 2018-01-18 ENCOUNTER — Telehealth: Payer: Self-pay | Admitting: Pharmacist

## 2018-01-18 DIAGNOSIS — E785 Hyperlipidemia, unspecified: Secondary | ICD-10-CM

## 2018-01-18 NOTE — Telephone Encounter (Signed)
[employer] form for 2020 mailed to patient with orders to repeat fasting lipid panel.  Patient to return completed form ASAP.

## 2018-01-24 ENCOUNTER — Encounter: Payer: Self-pay | Admitting: Sports Medicine

## 2018-01-24 ENCOUNTER — Ambulatory Visit (INDEPENDENT_AMBULATORY_CARE_PROVIDER_SITE_OTHER): Payer: PPO | Admitting: Sports Medicine

## 2018-01-24 VITALS — BP 122/80 | HR 80 | Ht 66.0 in | Wt 215.2 lb

## 2018-01-24 DIAGNOSIS — M9905 Segmental and somatic dysfunction of pelvic region: Secondary | ICD-10-CM

## 2018-01-24 DIAGNOSIS — J301 Allergic rhinitis due to pollen: Secondary | ICD-10-CM | POA: Diagnosis not present

## 2018-01-24 DIAGNOSIS — M9908 Segmental and somatic dysfunction of rib cage: Secondary | ICD-10-CM

## 2018-01-24 DIAGNOSIS — M9904 Segmental and somatic dysfunction of sacral region: Secondary | ICD-10-CM | POA: Diagnosis not present

## 2018-01-24 DIAGNOSIS — J3089 Other allergic rhinitis: Secondary | ICD-10-CM | POA: Diagnosis not present

## 2018-01-24 DIAGNOSIS — M9902 Segmental and somatic dysfunction of thoracic region: Secondary | ICD-10-CM | POA: Diagnosis not present

## 2018-01-24 DIAGNOSIS — M47816 Spondylosis without myelopathy or radiculopathy, lumbar region: Secondary | ICD-10-CM

## 2018-01-24 DIAGNOSIS — M5442 Lumbago with sciatica, left side: Secondary | ICD-10-CM | POA: Diagnosis not present

## 2018-01-24 DIAGNOSIS — G8929 Other chronic pain: Secondary | ICD-10-CM | POA: Diagnosis not present

## 2018-01-24 DIAGNOSIS — M9903 Segmental and somatic dysfunction of lumbar region: Secondary | ICD-10-CM | POA: Diagnosis not present

## 2018-01-24 DIAGNOSIS — J3081 Allergic rhinitis due to animal (cat) (dog) hair and dander: Secondary | ICD-10-CM | POA: Diagnosis not present

## 2018-01-24 NOTE — Progress Notes (Signed)
Joshua [name]. Joshua [name], [place] [facility] [place]  Joshua [name] - 76 y.o. male MRN [id number]  Date of birth: [birth year]  Visit Date: 01/24/2018  PCP: [name], MD   Referred by: [name], MD  Scribe(s) for today's visit: [name], LAT, ATC  SUBJECTIVE:  Joshua [name] is here for Follow-up (LBP w/ L leg radic)  HPI: 05/27/17: His thoracic and lumbar pain symptoms INITIALLY: Began a week prior to presentation after playing pickle ball. He felt pain on the R side of his back. A few days later he lifted a heavy bag a cat liter and felt extreme pain in his lower back. He also notes that he did planks on Monday. Push ups have flared up back pain in the past. Described as severe aching, radiating to L leg and gluteal region Worsened with movement, sit-to-stand, walking Improved with rest Additional associated symptoms include:  He denies groin pain and neck pain.  At this time symptoms are improving compared to onset, pain is a little less severe and doesn't extend as far.  He has been taking Diazepam at bedtime to help with sleep. Rx was originally for back pain after spinal surgery. He has tried taking Tylenol with some relief. He has been using heat with some relief.   06/07/17: Compared to the last office visit, his previously described symptoms show no change.  Current symptoms are moderate/severe & are radiating to RT side. He denies radiating pain into legs.  He tried a steroid dose pack and did get some relief while on the medication. He has tried taking Tylenol and Diazepam with some relief.   06/21/17: Compared to the last office visit, his previously described symptoms are worsening, having left-sided lower back pain. He was standing and rotated at the hips Saturday and has had severe pain since then.  LBP is constant. He feels like pain is related to abnormal gait.  Current symptoms are moderate/severe &  are radiating to L gluteal region.  He has been taking Tylenol prn.   07/02/2017: Compared to the last office visit, his previously described symptoms show no change Current symptoms are moderate & are radiating to L gluteal region He has been taking Tylenol prn with minimal relief.   08/26/2017: Compared to the last office visit on 07/02/17, his previously described low back pain symptoms are improved from previously but is still in pain w/ walking and standing.  He is walking x 30 min/day for exercise purposes.  He states that his R hand is "really bad" right now and cannot fully close/flex his R ring finger. Current symptoms are moderate & are radiating to L distal thigh (pain and N/T) He has been taking Tylenol prn for pain.  Pt saw Dr. [name] on 07/19/17 and had an L4-5 epidural. Had an L-spine XR on 05/27/17 and an MRI 07/01/17  09/06/2017: Compared to the last office visit, his previously described symptoms are worsening, midline LBP. L leg pain has improved since he last saw Dr. [name].  Current symptoms are moderate-severe & are radiating to the hips. 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  He has tried taking Tylenol in the past prn for the pain.   10/07/17: Compared to the last office visit, his previously described LBP and L leg weakness symptoms show no change.  He states that he is still having difficulty walking and standing. Current symptoms are moderate & are radiating  to B legs, mainly the L LE.  Today he is having most of his radiating pain into the L SIJ/glutes. He was prescribed a prednisone taper at his last visit and takes Tylenol prn.  He had a facet injection w/ Dr. [name] on 09/13/17 f/b an epidural injection on 09/27/17 which he did not respond well to.  11/04/2017: Compared to the last office visit on 10/07/17, his previously described LBP symptoms are improving w/ less overall pain.  He states that his L hip is bothering him the most today. Current symptoms are  moderate & are radiating to the L hip. He has been taking Tylenol prn.  He had a facet injection w/ Dr. [name] on 09/13/17 f/b an epidural injection on 09/27/17 which he did not respond well to.  He has been doing dry needling w/ [name] at [facility]. L-spine MRI - 07/01/17  11/18/2017: Compared to the last office visit, his previously described symptoms are improving. L hip pain has improved.  Current symptoms are mild-moderate & are radiating to the R hip/leg.  He has been taking Tylenol prn. He is still seeing Rob for dry needling.   12/13/2017: Compared to the last office visit, his previously described symptoms are improving. Sx began to flare up again after trying to install and electrical box in the ceiling.  Current symptoms are moderate & are non-radiating. He will have periodic flare ups or radiating pain in both legs.  He has been taking Tylenol prn, receiving dry needling, and doing HEP with some relief.   12/27/2017: Compared to the last office visit, his previously described symptoms are worsening. He started doing some exercises that he got after his prostate surgery and sx have started to flare up since then. Pain yesterday was about 8/10. Today pain is about 5/10.  Current symptoms are moderate & are radiating to the L leg. 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  He has been taking Tylenol prn but rarely. His pain was so bad yesterday that he almost felt the need to take a Tramadol.   01/10/2018: Compared to the last office visit on 12/27/17, his previously described low back pain and LE symptoms are improving.  Current symptoms are low moderate & are radiating to L LE (N/T and weakness) He has been taking Tylenol prn.  01/24/2018: Compared to the last office visit on 01/10/18, his previously described LBP and L LE symptoms are improving.  He notes less intensity of pain and will occasionally have periods of no pain. He  states that he is walking several times a week and doing his HEP almost daily. Current symptoms are 3-4/10 & are radiating to L upper thigh (N/T and weakness) He has been taking Tylenol prn.  He has been getting OMT treatments.  He has been doing his HEP almost daily.  L-spine MRI - 07/01/17     REVIEW OF SYSTEMS: Denies night time disturbances. Denies fevers, chills, or night sweats. Denies unexplained weight loss. Reports personal history of cancer. Denies changes in bowel habits, increased frequency and amount. Denies recent unreported falls. Denies new or worsening dyspnea or wheezing. Reports headaches.  Reports numbness, tingling or weakness  In the extremities - in the L LE, B feet due to neuropathy Denies dizziness or presyncopal episodes Denies lower extremity edema    HISTORY:  Prior history reviewed and updated per electronic medical record.  Social History   Occupational History  .  Occupation: retired    Fish farm manager: RETIRED  Tobacco Use  . Smoking status: Former Smoker    Packs/day: 1.00    Years: 35.00    Pack years: 35.00    Types: Cigarettes    Last attempt to quit: 06/16/1992    Years since quitting: 25.8  . Smokeless tobacco: Never Used  . Tobacco comment: discussed the AAA but thinks he may have had this   Substance and Sexual Activity  . Alcohol use: Yes    Alcohol/week: 4.0 standard drinks    Types: 4 Shots of liquor per week    Comment: mixed drink  . Drug use: No  . Sexual activity: Never   Social History   Social History Narrative   Married 42 years in 2015. No kids (mumps at age 30)      Retired from [employer] in [place] at [place]: poker, movies and tv, staying active    [place]:   Recent Labs    10/01/17 1703 01/27/18 0900 02/15/18 1603  HGBA1C 5.8*  --   --   CALCIUM 9.6 9.7 10.0  AST 16 26 29   ALT 23 31 30   TSH 1.24  --   --    Problem  Chronic Bilateral Low Back Pain With Left-Sided  Sciatica   MRI 07/01/2017: 1. 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 2. L4-5 advanced spinal stenosis. Moderate right more than left foraminal narrowing. 3. L5-S1 moderate left and mild right foraminal narrowing. Left subarticular recess narrowing that could affect the left S1 nerve root. 4. Mild levoscoliosis.    No specialty comments available.  OBJECTIVE:  VS:  HT:5\' 6"  (167.6 cm)   WT:215 lb 3.2 oz (97.6 kg)  BMI:34.75    BP:122/80  HR:80bpm  TEMP: ( )  RESP:95 %   PHYSICAL EXAM: CONSTITUTIONAL: Well-developed, Well-nourished and In no acute distress EYES: Pupils are equal., EOM intact without nystagmus. and No scleral icterus. Psychiatric: Alert & appropriately interactive. and Not depressed or anxious appearing. EXTREMITY EXAM: Warm and well perfused  Good cervical and lumbar range of motion although there are functional limitations. Negative straight leg raise. Upper extremity lower extremity strength is 5/5 in all myotomes. Normal sensation Significant anterior chain dominant posture.    ASSESSMENT   1. Chronic bilateral low back pain with left-sided sciatica   2. Lumbar spondylosis   3. Somatic dysfunction of thoracic region   4. Somatic dysfunction of lumbar region   5. Somatic dysfunction of rib cage region   6. Somatic dysfunction of pelvis region   7. Somatic dysfunction of sacral region     PROCEDURES:  PROCEDURE NOTE: OSTEOPATHIC MANIPULATION  The decision today to treat with Osteopathic Manipulative Therapy (OMT) was based on physical exam findings. Verbal consent was obtained following a discussion with the patient regarding the of risks, benefits and potential side effects, including an acute pain flare,post manipulation soreness and need for repeat treatments.   Contraindications to OMT: NONE Manipulation was performed as below: Regions Treated &  Osteopathic Exam Findings L3 FRS left Left on left sacral torsion Right anterior innominate  hip flexor tightness  OMT Techniques Used: HVLA muscle energy myofascial release  The patient tolerated the treatment well and reported Improved symptoms following treatment today. Patient was given medications, exercises, stretches and lifestyle modifications per AVS and verbally.  PLAN:  Pertinent additional documentation may be included in corresponding procedure notes, imaging studies, problem based documentation and patient instructions.  Chronic bilateral low back pain with left-sided sciatica This is both functional and structural in nature. He is responding well to osteopathic manipulation and home therapeutic exercises but he does tend to over exert himself.  Osteopathic manipulation was performed today based on physical exam findings.  Patient has responded well to osteopathic manipulation previously the prior manipulation did not provide permanent long lasting relief.  The patient does feel as though there was significant benefit to the prior manipulation and they wished for repeat manipulation today.  They understand that home therapeutic exercises are critical part of the healing/treatment process and will continue with self treatment between now and their next visit as outlined below.  The patient understands that the frequency of visits is meant to provide a stimulus to promote the body's own ability to heal and is not meant to be the sole means for improvement in their symptoms.  Continue previously prescribed home exercise program.   Discussed the underlying features of tight hip flexors leading to crouched, fetal like position that results in spinal column compression.  Including lumbar hyperflexion with hypermobility, thoracic flexion with restrictive rotation and cervical lordosis reversal.   Activity modifications and the importance of avoiding exacerbating activities  (limiting pain to no more than a 4 / 10 during or following activity) recommended and discussed.  Discussed red flag symptoms that warrant earlier emergent evaluation and patient voices understanding.  Return in about 2 weeks (around 02/07/2018) for consideration of repeat Osteopathic Manipulation.     CMA/ATC served as Education administrator during this visit. History, Physical, and Plan performed by medical provider. Documentation and orders reviewed and attested to.      Gerda Diss, [facility] Physician

## 2018-01-24 NOTE — Patient Instructions (Addendum)
Consider looking into / talking to [name] about Joshua [name] (long acting Gabapentin)

## 2018-01-25 ENCOUNTER — Other Ambulatory Visit: Payer: Self-pay

## 2018-01-25 ENCOUNTER — Encounter: Payer: Self-pay | Admitting: Neurology

## 2018-01-25 ENCOUNTER — Telehealth: Payer: Self-pay

## 2018-01-25 ENCOUNTER — Ambulatory Visit: Payer: PPO | Admitting: Neurology

## 2018-01-25 VITALS — BP 112/68 | HR 85 | Ht 66.75 in | Wt 217.0 lb

## 2018-01-25 DIAGNOSIS — R5383 Other fatigue: Secondary | ICD-10-CM

## 2018-01-25 DIAGNOSIS — G609 Hereditary and idiopathic neuropathy, unspecified: Secondary | ICD-10-CM

## 2018-01-25 NOTE — Telephone Encounter (Signed)
Lab orders placed.  

## 2018-01-25 NOTE — Patient Instructions (Addendum)
1. Bloodwork for CMP, B12, CBC  Please take you LAB SLIP to any laboratory, or your Primary Care Providers office for your lab draw.  Our office will contact you with results.   2. We will try Gralise 600mg  every night. If insurance does not allow, then we will go back to Gabapentin 3. Follow-up in 6 months, call for any changes
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
## 2018-01-27 ENCOUNTER — Other Ambulatory Visit: Payer: PPO

## 2018-01-27 ENCOUNTER — Other Ambulatory Visit (HOSPITAL_COMMUNITY)
Admission: RE | Admit: 2018-01-27 | Discharge: 2018-01-27 | Disposition: A | Discharge status: Home / Self Care | Payer: PPO | Source: Ambulatory Visit | Attending: Cardiology | Admitting: Cardiology

## 2018-01-27 DIAGNOSIS — E785 Hyperlipidemia, unspecified: Secondary | ICD-10-CM | POA: Insufficient documentation

## 2018-01-27 LAB — LIPID PANEL
Cholesterol: 95 mg/dL (ref 0–200)
HDL: 49 mg/dL (ref >40)
LDL Cholesterol: 16 mg/dL (ref 0–99)
Total CHOL/HDL Ratio: 1.9 RATIO
Triglycerides: 149 mg/dL (ref <150)
VLDL: 30 mg/dL (ref 0–40)

## 2018-01-27 LAB — CBC WITH DIFFERENTIAL/PLATELET
Abs Immature Granulocytes: 0.05 10*3/uL (ref 0.00–0.07)
Basophils Absolute: 0.1 10*3/uL (ref 0.0–0.1)
Basophils Relative: 1 %
Eosinophils Absolute: 0.2 10*3/uL (ref 0.0–0.5)
Eosinophils Relative: 3 %
HCT: 46.7 % (ref 39.0–52.0)
Hemoglobin: 16.3 g/dL (ref 13.0–17.0)
Immature Granulocytes: 1 %
Lymphocytes Relative: 20 %
Lymphs Abs: 1.3 10*3/uL (ref 0.7–4.0)
MCH: 31.2 pg (ref 26.0–34.0)
MCHC: 34.9 g/dL (ref 30.0–36.0)
MCV: 89.3 fL (ref 80.0–100.0)
Monocytes Absolute: 0.6 10*3/uL (ref 0.1–1.0)
Monocytes Relative: 9 %
Neutro Abs: 4.1 10*3/uL (ref 1.7–7.7)
Neutrophils Relative %: 66 %
Platelets: 241 10*3/uL (ref 150–400)
RBC: 5.23 MIL/uL (ref 4.22–5.81)
RDW: 11.9 % (ref 11.5–15.5)
WBC: 6.2 10*3/uL (ref 4.0–10.5)
nRBC: 0 % (ref 0.0–0.2)

## 2018-01-27 LAB — COMPREHENSIVE METABOLIC PANEL
ALT: 31 U/L (ref 0–44)
AST: 26 U/L (ref 15–41)
Albumin: 4.4 g/dL (ref 3.5–5.0)
Alkaline Phosphatase: 53 U/L (ref 38–126)
Anion gap: 10 (ref 5–15)
BUN: 17 mg/dL (ref 8–23)
CO2: 30 mmol/L (ref 22–32)
Calcium: 9.7 mg/dL (ref 8.9–10.3)
Chloride: 98 mmol/L (ref 98–111)
Creatinine, Ser: 1.34 mg/dL — ABNORMAL HIGH (ref 0.61–1.24)
GFR calc Af Amer: 59 mL/min — ABNORMAL LOW (ref >60)
GFR calc non Af Amer: 51 mL/min — ABNORMAL LOW (ref >60)
Glucose, Bld: 113 mg/dL — ABNORMAL HIGH (ref 70–99)
Potassium: 4.2 mmol/L (ref 3.5–5.1)
Sodium: 138 mmol/L (ref 135–145)
Total Bilirubin: 1.1 mg/dL (ref 0.3–1.2)
Total Protein: 7.4 g/dL (ref 6.5–8.1)

## 2018-01-27 LAB — VITAMIN B12: Vitamin B-12: 986 pg/mL — ABNORMAL HIGH (ref 180–914)

## 2018-01-31 ENCOUNTER — Encounter: Payer: Self-pay | Admitting: Podiatry

## 2018-01-31 ENCOUNTER — Ambulatory Visit: Payer: PPO | Admitting: Podiatry

## 2018-01-31 DIAGNOSIS — L600 Ingrowing nail: Secondary | ICD-10-CM

## 2018-01-31 DIAGNOSIS — L84 Corns and callosities: Secondary | ICD-10-CM

## 2018-01-31 NOTE — Progress Notes (Signed)
Subjective:   Patient ID: Joshua [name], male   DOB: 77 y.o.   MRN: [id number]   HPI Patient states he seems to be getting an ingrown toenail back on his right big toe he is got a painful callus underneath the left hallux and left first metatarsal which make it hard for him to walk comfortably   ROS      Objective:  Physical Exam  Neurovascular status intact with patient found to have thickness of the hallux nail lateral side with a area that we took the nail off doing extremely well and the patient having this problem in a more medial fashion.  Significant keratotic lesion underneath the first metatarsal head left hallux both of which are painful and hard to walk on     Assessment:  Possibility for further nail damage with ingrown component right big toe and keratotic lesion sub-left hallux left first metatarsal     Plan:  Discussed both problems separately and for the nail I have recommended trimming it in the possibility we may need to remove the entire nail.  For the lesions I went ahead and used sharp sterile debridement and debrided 2 separate lesions with no iatrogenic bleeding and applied padding to the big toes and patient will be seen back to reevaluate

## 2018-02-02 ENCOUNTER — Ambulatory Visit (INDEPENDENT_AMBULATORY_CARE_PROVIDER_SITE_OTHER): Payer: PPO | Admitting: Family Medicine

## 2018-02-02 ENCOUNTER — Encounter: Payer: Self-pay | Admitting: Family Medicine

## 2018-02-02 VITALS — BP 124/72 | HR 83 | Temp 98.4°F | Ht 66.75 in | Wt 216.8 lb

## 2018-02-02 DIAGNOSIS — N183 Chronic kidney disease, stage 3 unspecified: Secondary | ICD-10-CM

## 2018-02-02 DIAGNOSIS — F324 Major depressive disorder, single episode, in partial remission: Secondary | ICD-10-CM | POA: Diagnosis not present

## 2018-02-02 DIAGNOSIS — I10 Essential (primary) hypertension: Secondary | ICD-10-CM | POA: Diagnosis not present

## 2018-02-02 NOTE — Progress Notes (Signed)
Subjective:  Joshua [name] is a 77 y.o. year old very pleasant male patient who presents for/with See problem oriented charting ROS- occasional wheezing and SOB- resolved with albuterol. Occasional chest pain with walking- actually goes away while walking- feels like indigestion. No edema. Occasional headaches- neurology aware.    Past Medical History-  Patient Active Problem List   Diagnosis Date Noted  . Coronary atherosclerosis 10/12/2006    Priority: High  . Irritable bowel syndrome 10/12/2006    Priority: High  . Gout 11/20/2016    Priority: Medium  . CKD (chronic kidney disease), stage III ([place]) 10/23/2013    Priority: Medium  . Neuropathy ([place]) 08/21/2013    Priority: Medium  . History of bladder cancer 04/01/2010    Priority: Medium  . Essential hypertension 02/21/2007    Priority: Medium  . Hyperlipidemia 10/12/2006    Priority: Medium  . Depression, major, single episode, in partial remission ([place]) 10/12/2006    Priority: Medium  . Asthma 10/12/2006    Priority: Medium  . DJD (degenerative joint disease)     Priority: Low  . Arthritis     Priority: Low  . Neck pain 02/08/2017    Priority: Low  . Venous insufficiency 12/29/2016    Priority: Low  . Ganglion cyst of tendon sheath of right hand 12/29/2016    Priority: Low  . Avulsion fracture of metatarsal bone of right foot 11/25/2016    Priority: Low  . Obesity, Class I, BMI 30-34.9 10/03/2015    Priority: Low  . Allergic rhinitis 05/31/2014    Priority: Low  . Fatigue 11/09/2013    Priority: Low  . Dizziness 08/23/2013    Priority: Low  . RLS (restless legs syndrome) 01/03/2013    Priority: Low  . Chronic bilateral low back pain with left-sided sciatica 10/09/2011    Priority: Low  . OSTEOARTHRITIS, LOWER LEG, LEFT 10/04/2009    Priority: Low  . TOBACCO ABUSE, HX OF 12/26/2008    Priority: Low  . ANEMIA, IRON DEFICIENCY 11/06/2008    Priority: Low  . 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 OSA (obstructive sleep apnea) 11/02/2006    Priority: Low  . GERD 10/12/2006    Priority: Low  . Senile purpura ([place]) 10/07/2017  . Hyperglycemia 10/07/2017  . Weakness of left leg 06/21/2017  . Hereditary and idiopathic peripheral neuropathy 09/25/2014    Medications- reviewed and updated Current Outpatient Medications  Medication Sig Dispense Refill  . acetaminophen (TYLENOL) 500 MG tablet Take 1,000 mg by mouth every 6 (six) hours as needed for headache (pain). Reported on 07/02/2015    . albuterol (PROAIR HFA) 108 (90 BASE) MCG/ACT inhaler Inhale 1-2 puffs into the lungs every 6 (six) hours as needed for wheezing or shortness of breath. 18 g 3  . aspirin EC 81 MG tablet Take 81 mg by mouth daily.    . clotrimazole-betamethasone (LOTRISONE) cream USE 0.5 CM TRRANSDERMALLY TWICE A DAY  6  . Cyanocobalamin (VITAMIN B 12 PO) Take 800 mg by mouth daily.    [name] desloratadine (CLARINEX) 5 MG tablet Take 5 mg by mouth at bedtime.     . diazepam (VALIUM) 5 MG tablet Take 1 tablet (5 mg total) by mouth at bedtime as needed. 30 tablet 1  . Evolocumab (REPATHA SURECLICK) 144 MG/ML SOAJ Inject 140 mg into the skin every 14 (fourteen) days. 2 pen 11  . FLOVENT HFA 110 MCG/ACT inhaler Inhale 2 puffs into the lungs 2 (two)  times daily. Reported on 03/22/2015  5  . fluticasone (FLONASE) 50 MCG/ACT nasal spray Place 1 spray into both nostrils 2 (two) times daily.     . folic acid (FOLVITE) 540 MCG tablet Take 400 mcg by mouth daily.    [name] gabapentin (NEURONTIN) 100 MG capsule Take 1 cap three times a day, 4 caps at bedtime 630 capsule 3  . hydrochlorothiazide (HYDRODIURIL) 25 MG tablet Take 1 tablet (25 mg total) by mouth daily. 90 tablet 3  . ketoconazole (NIZORAL) 2 % cream Apply topically 2 (two) times daily.    [name] losartan (COZAAR) 100 MG tablet Take 1 tablet (100 mg total) by mouth daily. 90 tablet 3  . NON FORMULARY     . nystatin (MYCOSTATIN/NYSTOP) powder Apply topically 4 (four)  times daily. 15 g 0  . omeprazole (PRILOSEC) 20 MG capsule TAKE 1 CAPSULE BY MOUTH EVERY DAY 90 capsule 1  . ondansetron (ZOFRAN ODT) 8 MG disintegrating tablet Take 1 tablet (8 mg total) by mouth every 8 (eight) hours as needed for nausea or vomiting. 20 tablet 0  . polyethylene glycol powder (GLYCOLAX/MIRALAX) powder Take 17 g by mouth daily. 255 g 0  . Probiotic Product (ALIGN) 4 MG CAPS Take 4 mg by mouth daily.     . simvastatin (ZOCOR) 20 MG tablet Take 1 tablet (20 mg total) by mouth at bedtime. 90 tablet 3   No current facility-administered medications for this visit.     Objective: BP 124/72 (BP Location: Left Arm, Patient Position: Sitting, Cuff Size: Large)   Pulse 83   Temp 98.4 F (36.9 C) (Oral)   Ht 5' 6.75" (1.695 m)   Wt 216 lb 12.8 oz (98.3 kg)   SpO2 97%   BMI 34.21 kg/m  Gen: NAD, resting comfortably CV: RRR no murmurs rubs or gallops Lungs: CTAB no crackles, wheeze, rhonchi Abdomen: soft/nontender/nondistendedobese  Ext: no edema Skin: warm, dry Neuro: grossly normal, moves all extremities  Assessment/Plan:  Other notes: 1.Last visit he was planning to try to do some weights or recumbent bike as walking is hard for him exercise wise- doing about 2 days a week.  He also wanted to cut down on portion sizes- states has cut down on this particularly at night- weight on home scales better.  Does have some hyperglycemia/prediabetes-consider repeat A1c at future visit Wt Readings from Last 3 Encounters:  02/02/18 216 lb 12.8 oz (98.3 kg)  01/25/18 217 lb (98.4 kg)  01/24/18 215 lb 3.2 oz (97.6 kg)  2.  Known coronary artery disease- follows with Dr. Percival Spanish in January.  On Repatha and aspirin. 3. Follows with Dr. Remus Blake- asthma stable 4. Following with [name] for neuropathy- gabapentin seems to help- he is trialing slightly higher dose in 600-800 mg. For headaches- had MRI earlier this year- no change since that time.   Hypertension  s: controlled on  losartan 100 mg and hydrochlorthiazide 25 mg BP Readings from Last 3 Encounters:  02/02/18 124/72  01/25/18 112/68  01/24/18 122/80  A/P: We discussed blood pressure goal of <140/90. Continue current meds  Depression, major, single episode, in partial remission ([address]) S: Mild poor control off medicine last visit with PHQ 9 of 7.  He has tried medication in the past but he feels very tired/sleepy.  We have considered restarting Lexapro at 5 mg as needed  Has done counseling twice in past- somewhat helpful. He feels good enough right now even at phq9 of 7 to not pursue that  at present A/P: stable- continue with medication . Counseled some today- and discussed doing further counseling- he declines  CKD (chronic kidney disease), stage III ([place]) S: Patient's GFR remains in the range 50s.  He remains on ARB in case proteinuric element A/P: Just had bmet last week and was stable-continue to monitor   Future Appointments  Date Time Provider [place]  02/07/2018  9:40 AM Gerda Diss, DO [facility]  04/18/2018  9:20 AM [name], MD [facility]  09/12/2018  8:30 AM Cameron Sprang, MD [facility] None   No follow-ups on file.  Lab/Order associations: Essential hypertension  Depression, major, single episode, in partial remission ([place])  CKD (chronic kidney disease), stage III ([place])  No orders of the defined types were placed in this encounter.   Return precautions advised.  [name], MD

## 2018-02-02 NOTE — Assessment & Plan Note (Signed)
S: Patient's GFR remains in the range 50s.  He remains on ARB in case proteinuric element A/P: Just had bmet last week and was stable-continue to monitor

## 2018-02-02 NOTE — Patient Instructions (Addendum)
  No changes today  Glad you are doing well!

## 2018-02-02 NOTE — Assessment & Plan Note (Signed)
S: Mild poor control off medicine last visit with PHQ 9 of 7.  He has tried medication in the past but he feels very tired/sleepy.  We have considered restarting Lexapro at 5 mg as needed  Has done counseling twice in past- somewhat helpful. He feels good enough right now even at phq9 of 7 to not pursue that at present A/P: stable- continue with medication . Counseled some today- and discussed doing further counseling- he declines

## 2018-02-07 ENCOUNTER — Ambulatory Visit (INDEPENDENT_AMBULATORY_CARE_PROVIDER_SITE_OTHER): Payer: PPO | Admitting: Sports Medicine

## 2018-02-07 ENCOUNTER — Other Ambulatory Visit: Payer: Self-pay

## 2018-02-07 ENCOUNTER — Encounter: Payer: Self-pay | Admitting: Sports Medicine

## 2018-02-07 VITALS — BP 128/76 | HR 91 | Ht 66.75 in | Wt 218.2 lb

## 2018-02-07 DIAGNOSIS — M9905 Segmental and somatic dysfunction of pelvic region: Secondary | ICD-10-CM | POA: Diagnosis not present

## 2018-02-07 DIAGNOSIS — M9906 Segmental and somatic dysfunction of lower extremity: Secondary | ICD-10-CM

## 2018-02-07 DIAGNOSIS — J3081 Allergic rhinitis due to animal (cat) (dog) hair and dander: Secondary | ICD-10-CM | POA: Diagnosis not present

## 2018-02-07 DIAGNOSIS — M47816 Spondylosis without myelopathy or radiculopathy, lumbar region: Secondary | ICD-10-CM

## 2018-02-07 DIAGNOSIS — M9902 Segmental and somatic dysfunction of thoracic region: Secondary | ICD-10-CM | POA: Diagnosis not present

## 2018-02-07 DIAGNOSIS — M5442 Lumbago with sciatica, left side: Secondary | ICD-10-CM

## 2018-02-07 DIAGNOSIS — M9903 Segmental and somatic dysfunction of lumbar region: Secondary | ICD-10-CM

## 2018-02-07 DIAGNOSIS — G8929 Other chronic pain: Secondary | ICD-10-CM | POA: Diagnosis not present

## 2018-02-07 DIAGNOSIS — J301 Allergic rhinitis due to pollen: Secondary | ICD-10-CM | POA: Diagnosis not present

## 2018-02-07 DIAGNOSIS — M9904 Segmental and somatic dysfunction of sacral region: Secondary | ICD-10-CM | POA: Diagnosis not present

## 2018-02-07 DIAGNOSIS — G609 Hereditary and idiopathic neuropathy, unspecified: Secondary | ICD-10-CM

## 2018-02-07 DIAGNOSIS — J3089 Other allergic rhinitis: Secondary | ICD-10-CM | POA: Diagnosis not present

## 2018-02-07 NOTE — Progress Notes (Signed)
PROCEDURE NOTE : OSTEOPATHIC MANIPULATION The decision today to treat with Osteopathic Manipulative Therapy (OMT) was based on physical exam findings. Verbal consent was obtained following a discussion with the patient regarding the of risks, benefits and potential side effects, including an acute pain flare,post manipulation soreness and need for repeat treatments.     Contraindications to OMT: NONE  Manipulation was performed as below: Regions Treated OMT Techniques Used  Thoracic spine Ribs Pelvis Sacrum Lower extremities . HVLA . muscle energy . myofascial release   The patient tolerated the treatment well and reported Improved symptoms following treatment today. Patient was given medications, exercises, stretches and lifestyle modifications per AVS and verbally.   OSTEOPATHIC/STRUCTURAL EXAM:   T6 ERS left (Extended, Rotated & Sidebent) Rib 8 Right  Posterior Right psoas spasm Right anterior innonimate L on L sacral torsion Right internally rotated hip

## 2018-02-07 NOTE — Assessment & Plan Note (Signed)
He has been doing quite well up until this weekend.  Does report sitting for prolonged periods on Saturday watching football games and then went walking on Sunday in [employer] which likely significantly exacerbated his symptoms.  He is in a functionally tight hip flexor position which is likely contributing to this acute flareup.  This position was improved following osteopathic manipulation.  He is also looking into new shoes for his upcoming trip to [facility].

## 2018-02-07 NOTE — Progress Notes (Signed)
Joshua [name]. Joshua [name], [facility]  CASSELL VOORHIES - 78 y.o. 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 He felt pain on the R side of his back. A few days later he lifted a heavy bag a cat liter and felt extreme pain in his lower back. He also notes that he did planks on Monday. Push ups have flared up back pain in the past. Described as severe aching, radiating to L leg and gluteal region Worsened with movement, sit-to-stand, walking Improved with rest Additional associated symptoms include:  He denies groin pain and neck pain.  At this time symptoms are improving compared to onset, pain is a little less severe and doesn't extend as far.  He has been taking Diazepam at bedtime to help with sleep. Rx was originally for back pain after spinal surgery. He has tried taking Tylenol with some relief. He has been using heat with some relief.   06/07/17: Compared to the last office visit, his previously described symptoms show no change.  Current symptoms are moderate/severe & are radiating to RT side. He denies radiating pain into legs.  He tried a steroid dose pack and did get some relief while on the medication. He has tried taking Tylenol and Diazepam with some relief.   06/21/17: Compared to the last office visit, his previously described symptoms are worsening, having left-sided lower back pain. He was standing and rotated at the hips Saturday and has had severe pain since then.  LBP is constant. He feels like pain is related to abnormal gait.  Current  symptoms are moderate/severe & are radiating to L gluteal region.  He has been taking Tylenol prn.   07/02/2017: Compared to the last office visit, his previously described symptoms show no change Current symptoms are moderate & are radiating to L gluteal region He has been taking Tylenol prn with minimal relief.   08/26/2017: Compared to the last office visit on 07/02/17, his previously described low back pain symptoms are improved from previously but is still in pain w/ walking and standing.  He is walking x 30 min/day for exercise purposes.  He states that his R hand is "really bad" right now and cannot fully close/flex his R ring finger. Current symptoms are moderate & are radiating to L distal thigh (pain and N/T) He has been taking Tylenol prn for pain.  Pt saw Dr. [name] on 07/19/17 and had an L4-5 epidural. Had an L-spine XR on 05/27/17 and an MRI 07/01/17  09/06/2017: Compared to the last office visit, his previously described symptoms are worsening, midline LBP. L leg pain has improved since he last saw Dr. [name].  Current symptoms are moderate-severe & are radiating to the hips. 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  He has tried taking Tylenol in the past prn for the pain.   10/07/17: Compared to the last office visit, his previously described LBP and L leg weakness symptoms show no change.  He states that he is still having difficulty walking and standing. Current symptoms are moderate &  are radiating to B legs, mainly the L LE.  Today he is having most of his radiating pain into the L SIJ/glutes. He was prescribed a prednisone taper at his last visit and takes Tylenol prn.  He had a facet injection w/ Dr. [name] on 09/13/17 f/b an epidural injection on 09/27/17 which he did not respond well to.  11/04/2017: Compared to the last office visit on 10/07/17, his previously described LBP symptoms are improving w/ less overall pain.  He states that his L hip is bothering him the most  today. Current symptoms are moderate & are radiating to the L hip. He has been taking Tylenol prn.  He had a facet injection w/ Dr. [name] on 09/13/17 f/b an epidural injection on 09/27/17 which he did not respond well to.  He has been doing dry needling w/ [name] at [facility]. L-spine MRI - 07/01/17  11/18/2017: Compared to the last office visit, his previously described symptoms are improving. L hip pain has improved.  Current symptoms are mild-moderate & are radiating to the R hip/leg.  He has been taking Tylenol prn. He is still seeing Rob for dry needling.   12/13/2017: Compared to the last office visit, his previously described symptoms are improving. Sx began to flare up again after trying to install and electrical box in the ceiling.  Current symptoms are moderate & are non-radiating. He will have periodic flare ups or radiating pain in both legs.  He has been taking Tylenol prn, receiving dry needling, and doing HEP with some relief.   12/27/2017: Compared to the last office visit, his previously described symptoms are worsening. He started doing some exercises that he got after his prostate surgery and sx have started to flare up since then. Pain yesterday was about 8/10. Today pain is about 5/10.  Current symptoms are moderate & are radiating to the L leg. 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  He has been taking Tylenol prn but rarely. His pain was so bad yesterday that he almost felt the need to take a Tramadol.   01/10/2018: Compared to the last office visit on 12/27/17, his previously described low back pain and LE symptoms are improving.  Current symptoms are low moderate & are radiating to L LE (N/T and weakness) He has been taking Tylenol prn.  01/24/2018: Compared to the last office visit on 01/10/18, his previously described LBP and L LE symptoms are improving.  He notes less intensity of pain and will occasionally  have periods of no pain. He states that he is walking several times a week and doing his HEP almost daily. Current symptoms are 3-4/10 & are radiating to L upper thigh (N/T and weakness) He has been taking Tylenol prn.  He has been getting OMT treatments.  He has been doing his HEP almost daily.  02/07/2018: Compared to the last office visit on 01/24/18, his previously described LBP and L LE symptoms are worsening.  He states that his back is "killing him today."  He states that he went shopping at the new [employer] and notes that his symptoms flared up ever since then. Current symptoms are moderate+ aching/throbbin pain & are radiating to L upper thigh (N/T and weakness) He has been taking Tylenol prn.  He has been getting OMT treatments.  He has been doing his HEP almost daily.     REVIEW OF SYSTEMS: Denies night time disturbances. Denies  fevers, chills, or night sweats. Denies unexplained weight loss. Reports personal history of cancer. Denies changes in bowel habits, increased frequency and amount. Denies recent unreported falls. Denies new or worsening dyspnea or wheezing. Reports headaches.  Reports numbness, tingling or weakness  In the extremities - in the L LE, B feet due to neuropathy Denies dizziness or presyncopal episodes Denies lower extremity edema   HISTORY:  Prior history reviewed and updated per electronic medical record.  Social History   Occupational History  . Occupation: retired    Fish farm manager: RETIRED  Tobacco Use  . Smoking status: Former Smoker    Packs/day: 1.00    Years: 35.00    Pack years: 35.00    Types: Cigarettes    Last attempt to quit: 06/16/1992    Years since quitting: 25.6  . Smokeless tobacco: Never Used  . Tobacco comment: discussed the AAA but thinks he may have had this   Substance and Sexual Activity  . Alcohol use: Yes    Alcohol/week: 4.0 standard drinks    Types: 4 Shots of liquor per week    Comment: mixed drink  . Drug use: No  .  Sexual activity: Never   Social History   Social History Narrative   Married 42 years in 2015. No kids (mumps at age 82)      Retired from [employer] in [place] at [place]: poker, movies and tv, staying active    [place]:   Recent Labs    10/01/17 1703  HGBA1C 5.8*    MRI 07/01/2017: 1. 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 2. L4-5 advanced spinal stenosis. Moderate right more than left foraminal narrowing. 3. L5-S1 moderate left and mild right foraminal narrowing. Left subarticular recess narrowing that could affect the left S1 nerve root. 4. Mild levoscoliosis. [name] He has tried selective nerve root blocks for consideration of RFA with Dr. [name] but was not a candidate due to lack of efficacy.   OBJECTIVE:  VS:  HT:5' 6.75" (169.5 cm)   WT:218 lb 3.2 oz (99 kg)  BMI:34.45    BP:128/76  HR:91bpm  TEMP: ( )  RESP:93 %   PHYSICAL EXAM: CONSTITUTIONAL: Well-developed, Well-nourished and In no acute distress Psychiatric: Alert & appropriately interactive. and Not depressed or anxious appearing. RESPIRATORY: No increased work of breathing and Trachea Midline EYES: Pupils are equal., EOM intact without nystagmus. and No scleral icterus.  Lower extremities: EXTREMITY EXAM: Warm and well perfused Edema: No Pre-tibial edema and No significant swelling or edema NEURO: unremarkable Normal associated myotomal distribution strength to manual muscle testing  MSK Exam: Back  Well aligned, no significant deformity. No overlying skin changes. TTP over Mild bilateral paraspinal muscles.  Persistent tightness within the right greater than left paraspinal muscles as well as in the right hip flexor and TFL.   RANGE OF MOTION & STRENGTH  Bilateral hip flexor contractures.  Improved lumbar rotation in sidebending with osteopathic manipulation but  limited in all planes.   SPECIALITY TESTING:  Negative straight leg raise bilaterally.  He is able to heel and toe walk but walks with a wide-based gait.     ASSESSMENT   1. Lumbar spondylosis   2. Chronic bilateral low back pain with left-sided sciatica   3. Somatic dysfunction of lower extremity   4. Somatic dysfunction of thoracic region  5. Somatic dysfunction of lumbar region   6. Somatic dysfunction of pelvis region   7. Somatic dysfunction of sacral region     PLAN:  Pertinent additional documentation may be included in corresponding procedure notes, imaging studies, problem based documentation and patient instructions.  Procedures:  . Osteopathic manipulation was performed today based on physical exam findings.  Please see procedure note for further information including Osteopathic Exam findings  Medications:  No orders of the defined types were placed in this encounter.  Discussion/Instructions: Chronic bilateral low back pain with left-sided sciatica He has been doing quite well up until this weekend.  Does report sitting for prolonged periods on Saturday watching football games and then went walking on Sunday in [employer] which likely significantly exacerbated his symptoms.  He is in a functionally tight hip flexor position which is likely contributing to this acute flareup.  This position was improved following osteopathic manipulation.  He is also looking into new shoes for his upcoming trip to [facility].  [name] Ultimately his low back pain is multifactorial and likely related to both spinal stenosis is causing some the left leg pain and weakness as well as lumbar spondylosis is likely causing the majority of his axial back pain.  He has tried multiple interventions including injections, topical analgesics and thermic care products without significant benefit.  Most beneficial things have been acupuncture and osteopathic manipulation. . Continue previously prescribed home  exercise program.  . Discussed red flag symptoms that warrant earlier emergent evaluation and patient voices understanding. . Activity modifications and the importance of avoiding exacerbating activities (limiting pain to no more than a 4 / 10 during or following activity) recommended and discussed.  Follow-up:  . Return in about 2 weeks (around 02/21/2018) for consideration of repeat osteopathic manipulation.   . If any lack of improvement consider: consider referral to Physical Therapy and consider referral to Orthopedics for Neurosurgical evaluation but he is not interested in this at this time.   . At follow up will plan to consider: to consider repeat osteopathic manipulation     CMA/ATC served as scribe during this visit. History, Physical, and Plan performed by medical provider. Documentation and orders reviewed and attested to.      Gerda Diss, [facility] Physician

## 2018-02-15 ENCOUNTER — Ambulatory Visit (INDEPENDENT_AMBULATORY_CARE_PROVIDER_SITE_OTHER): Payer: PPO

## 2018-02-15 ENCOUNTER — Ambulatory Visit (INDEPENDENT_AMBULATORY_CARE_PROVIDER_SITE_OTHER): Payer: PPO | Admitting: Family Medicine

## 2018-02-15 ENCOUNTER — Encounter: Payer: Self-pay | Admitting: Family Medicine

## 2018-02-15 VITALS — BP 112/64 | HR 93 | Temp 97.5°F | Ht 66.75 in | Wt 212.2 lb

## 2018-02-15 DIAGNOSIS — R0789 Other chest pain: Secondary | ICD-10-CM

## 2018-02-15 DIAGNOSIS — R52 Pain, unspecified: Secondary | ICD-10-CM | POA: Diagnosis not present

## 2018-02-15 DIAGNOSIS — R6883 Chills (without fever): Secondary | ICD-10-CM | POA: Diagnosis not present

## 2018-02-15 DIAGNOSIS — R0602 Shortness of breath: Secondary | ICD-10-CM | POA: Diagnosis not present

## 2018-02-15 LAB — POC URINALSYSI DIPSTICK (AUTOMATED)
Bilirubin, UA: NEGATIVE
Blood, UA: NEGATIVE
Glucose, UA: NEGATIVE
Ketones, UA: NEGATIVE
Leukocytes, UA: NEGATIVE
Nitrite, UA: NEGATIVE
Protein, UA: NEGATIVE
Spec Grav, UA: 1.015 (ref 1.010–1.025)
Urobilinogen, UA: 0.2 E.U./dL
pH, UA: 6 (ref 5.0–8.0)

## 2018-02-15 LAB — POC INFLUENZA A&B (BINAX/QUICKVUE)
Influenza A, POC: NEGATIVE
Influenza B, POC: NEGATIVE

## 2018-02-15 MED ORDER — OSELTAMIVIR PHOSPHATE 75 MG PO CAPS: 75.0000 mg | ORAL_CAPSULE | Freq: Two times a day (BID) | ORAL | 0 refills | Status: DC

## 2018-02-15 NOTE — Progress Notes (Signed)
Subjective:  Joshua [name] is a 77 y.o. year old very pleasant male patient who presents for/with See problem oriented charting ROS-subjective fevers and measured fever to 100.4 this morning, chills.  Denies shortness of breath.  Body aches all over-does feel some discomfort in chest-did push-ups recently though.  No nausea or vomiting.  Past Medical History-  Patient Active Problem List   Diagnosis Date Noted  . Coronary atherosclerosis 10/12/2006    Priority: High  . Irritable bowel syndrome 10/12/2006    Priority: High  . Gout 11/20/2016    Priority: Medium  . CKD (chronic kidney disease), stage III ([place]) 10/23/2013    Priority: Medium  . Neuropathy ([place]) 08/21/2013    Priority: Medium  . History of bladder cancer 04/01/2010    Priority: Medium  . Essential hypertension 02/21/2007    Priority: Medium  . Hyperlipidemia 10/12/2006    Priority: Medium  . Depression, major, single episode, in partial remission ([place]) 10/12/2006    Priority: Medium  . Asthma 10/12/2006    Priority: Medium  . DJD (degenerative joint disease)     Priority: Low  . Arthritis     Priority: Low  . Neck pain 02/08/2017    Priority: Low  . Venous insufficiency 12/29/2016    Priority: Low  . Ganglion cyst of tendon sheath of right hand 12/29/2016    Priority: Low  . Avulsion fracture of metatarsal bone of right foot 11/25/2016    Priority: Low  . Obesity, Class I, BMI 30-34.9 10/03/2015    Priority: Low  . Allergic rhinitis 05/31/2014    Priority: Low  . Fatigue 11/09/2013    Priority: Low  . Dizziness 08/23/2013    Priority: Low  . RLS (restless legs syndrome) 01/03/2013    Priority: Low  . Chronic bilateral low back pain with left-sided sciatica 10/09/2011    Priority: Low  . OSTEOARTHRITIS, LOWER LEG, LEFT 10/04/2009    Priority: Low  . TOBACCO ABUSE, HX OF 12/26/2008    Priority: Low  . ANEMIA, IRON DEFICIENCY 11/06/2008    Priority: Low  . 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 OSA (obstructive sleep apnea) 11/02/2006    Priority: Low  . GERD 10/12/2006    Priority: Low  . Senile purpura ([place]) 10/07/2017  . Hyperglycemia 10/07/2017  . Weakness of left leg 06/21/2017  . Hereditary and idiopathic peripheral neuropathy 09/25/2014    Medications- reviewed and updated Current Outpatient Medications  Medication Sig Dispense Refill  . acetaminophen (TYLENOL) 500 MG tablet Take 1,000 mg by mouth every 6 (six) hours as needed for headache (pain). Reported on 07/02/2015    . albuterol (PROAIR HFA) 108 (90 BASE) MCG/ACT inhaler Inhale 1-2 puffs into the lungs every 6 (six) hours as needed for wheezing or shortness of breath. 18 g 3  . aspirin EC 81 MG tablet Take 81 mg by mouth daily.    . clotrimazole-betamethasone (LOTRISONE) cream USE 0.5 CM TRRANSDERMALLY TWICE A DAY  6  . Cyanocobalamin (VITAMIN B 12 PO) Take 800 mg by mouth daily.    [name] desloratadine (CLARINEX) 5 MG tablet Take 5 mg by mouth at bedtime.     . diazepam (VALIUM) 5 MG tablet Take 1 tablet (5 mg total) by mouth at bedtime as needed. 30 tablet 1  . Evolocumab (REPATHA SURECLICK) 237 MG/ML SOAJ Inject 140 mg into the skin every 14 (fourteen) days. 2 pen 11  . FLOVENT HFA 110 MCG/ACT inhaler Inhale 2  puffs into the lungs 2 (two) times daily. Reported on 03/22/2015  5  . fluticasone (FLONASE) 50 MCG/ACT nasal spray Place 1 spray into both nostrils 2 (two) times daily.     . folic acid (FOLVITE) 580 MCG tablet Take 400 mcg by mouth daily.    [name] gabapentin (NEURONTIN) 100 MG capsule Take 1 cap three times a day, 4 caps at bedtime 630 capsule 3  . hydrochlorothiazide (HYDRODIURIL) 25 MG tablet Take 1 tablet (25 mg total) by mouth daily. 90 tablet 3  . ketoconazole (NIZORAL) 2 % cream Apply topically 2 (two) times daily.    [name] losartan (COZAAR) 100 MG tablet Take 1 tablet (100 mg total) by mouth daily. 90 tablet 3  . NON FORMULARY     . nystatin (MYCOSTATIN/NYSTOP) powder Apply topically 4  (four) times daily. 15 g 0  . omeprazole (PRILOSEC) 20 MG capsule TAKE 1 CAPSULE BY MOUTH EVERY DAY 90 capsule 1  . ondansetron (ZOFRAN ODT) 8 MG disintegrating tablet Take 1 tablet (8 mg total) by mouth every 8 (eight) hours as needed for nausea or vomiting. 20 tablet 0  . polyethylene glycol powder (GLYCOLAX/MIRALAX) powder Take 17 g by mouth daily. 255 g 0  . Probiotic Product (ALIGN) 4 MG CAPS Take 4 mg by mouth daily.     . simvastatin (ZOCOR) 20 MG tablet Take 1 tablet (20 mg total) by mouth at bedtime. 90 tablet 3  . oseltamivir (TAMIFLU) 75 MG capsule Take 1 capsule (75 mg total) by mouth 2 (two) times daily. 10 capsule 0   No current facility-administered medications for this visit.     Objective: BP 112/64 (BP Location: Left Arm, Patient Position: Sitting, Cuff Size: Large)   Pulse 93   Temp (!) 97.5 F (36.4 C) (Oral)   Ht 5' 6.75" (1.695 m)   Wt 212 lb 3.2 oz (96.3 kg)   SpO2 94%   BMI 33.48 kg/m  Gen: Appears fatigued Oropharynx largely normal, nares largely normal CV: RRR no murmurs rubs or gallops Patient with diffuse chest wall tenderness.  He is also tender to touch in most of his muscle groups though. Lungs: CTAB no crackles, wheeze, rhonchi Abdomen: soft/nontender/nondistended/normal bowel sounds. No rebound or guarding.  Ext: no edema Skin: warm, dry  Results for orders placed or performed in visit on 02/15/18 (from the past 24 hour(s))  POC Influenza A&B(BINAX/QUICKVUE)     Status: None   Collection Time: 02/15/18  4:05 PM  Result Value Ref Range   Influenza A, POC Negative Negative   Influenza B, POC Negative Negative  POCT Urinalysis Dipstick (Automated)     Status: None   Collection Time: 02/15/18  4:31 PM  Result Value Ref Range   Color, UA Yellow    Clarity, UA Clear    Glucose, UA Negative Negative   Bilirubin, UA Negative    Ketones, UA Negative    Spec Grav, UA 1.015 1.010 - 1.025   Blood, UA Negative    pH, UA 6.0 5.0 - 8.0   Protein, UA  Negative Negative   Urobilinogen, UA 0.2 0.2 or 1.0 E.U./dL   Nitrite, UA Negative    Leukocytes, UA Negative Negative   EKG: sinus rhythm with rate 83 but 2 PACs noted, normal axis, normal intervals, no hypertrophy, no st or t wave chages- slight flattening in III but no significant change from EKG 10/01/17 on EKG other than new PACs   Assessment/Plan:   Body aches - Plan: POC Influenza  A&B(BINAX/QUICKVUE), CBC with Differential/Platelet, Comprehensive metabolic panel, POCT Urinalysis Dipstick (Automated)  Chills - Plan: CBC with Differential/Platelet, Comprehensive metabolic panel, POCT Urinalysis Dipstick (Automated)  Chest discomfort - Plan: DG Chest 2 View  Atypical chest pain - Plan: EKG 12-Lead  S: woke up last night with chills- lasted for a few hours. Laid in bed until 10 Am this morning. Shaking/aching all over from tip of head to bottom of toes. Temp 100.4 this AM. Gave him some tylenol and only treatment he has had all day. Low appetite- no nausea or vomiting. No diarrhea.   States has some mid chest discomfort in band across chest. Has not tried nitroglycerin. No history of chest pain with CAD in past- he states he has been doing push ups lately and feels like strained muscle in chest- is reproducible  Just got back from [place].  A/P: 77 year old male with multiple medical problems including coronary artery disease, CKD, asthma, hyperlipidemia, hypertension, neuropathy who previously got very ill with influenza a few years ago.  He is having some atypical chest pain today-EKG largely stable.  We discussed this did not rule out heart attack but he does not want to go to the hospital at this time.  I do think that chest pain is most likely related to his diffuse body aches but focalized an area where he recently may have strained himself doing push-ups.  I am leaning towards treating potential influenza given how ill he became previously.  Plan/discussionFrom AVS:  "Possible flu-  though flu test negative  Getting x-ray to rule out pneumonia  Getting bloodwork for further evaluation with how sick you feel  Urine is normal  Giving tamiflu in case other workup is not revealing- hold on picking up until we chat tomorrow  Call at 1 PM in case you havent heard anything about your results  Seek care if shortness of breath, fever, worsening chest pain "  Future Appointments  Date Time Provider [place]  02/21/2018  9:40 AM Gerda Diss, DO [facility]  04/18/2018  9:20 AM [name], MD [facility]  05/05/2018  9:20 AM [name], MD [facility]  09/12/2018  8:30 AM Cameron Sprang, MD [facility] None   Lab/Order associations: Body aches - Plan: POC Influenza A&B(BINAX/QUICKVUE), CBC with Differential/Platelet, Comprehensive metabolic panel, POCT Urinalysis Dipstick (Automated)  Chills - Plan: CBC with Differential/Platelet, Comprehensive metabolic panel, POCT Urinalysis Dipstick (Automated)  Chest discomfort - Plan: DG Chest 2 View  Atypical chest pain - Plan: EKG 12-Lead  Meds ordered this encounter  Medications  . oseltamivir (TAMIFLU) 75 MG capsule    Sig: Take 1 capsule (75 mg total) by mouth 2 (two) times daily.    Dispense:  10 capsule    Refill:  0    Return precautions advised.  [name], MD

## 2018-02-15 NOTE — Patient Instructions (Addendum)
Possible flu- though flu test negative  Getting x-ray to rule out pneumonia  Getting bloodwork for further evaluation with how sick you feel  Urine is normal  Giving tamiflu in case other workup is not revealing- hold on picking up until we chat tomorrow  Call at 1 PM in case you havent heard anything about your results  Seek care if shortness of breath, fever, worsening chest pain

## 2018-02-16 ENCOUNTER — Telehealth: Payer: Self-pay | Admitting: Family Medicine

## 2018-02-16 LAB — CBC WITH DIFFERENTIAL/PLATELET
Basophils Absolute: 0.1 10*3/uL (ref 0.0–0.1)
Basophils Relative: 0.5 % (ref 0.0–3.0)
Eosinophils Absolute: 0.2 10*3/uL (ref 0.0–0.7)
Eosinophils Relative: 1 % (ref 0.0–5.0)
HCT: 46.3 % (ref 39.0–52.0)
Hemoglobin: 16.1 g/dL (ref 13.0–17.0)
Lymphocytes Relative: 9.1 % — ABNORMAL LOW (ref 12.0–46.0)
Lymphs Abs: 1.6 10*3/uL (ref 0.7–4.0)
MCHC: 34.7 g/dL (ref 30.0–36.0)
MCV: 90.6 fl (ref 78.0–100.0)
Monocytes Absolute: 0.9 10*3/uL (ref 0.1–1.0)
Monocytes Relative: 5.4 % (ref 3.0–12.0)
Neutro Abs: 14.4 10*3/uL — ABNORMAL HIGH (ref 1.4–7.7)
Neutrophils Relative %: 84 % — ABNORMAL HIGH (ref 43.0–77.0)
Platelets: 270 10*3/uL (ref 150.0–400.0)
RBC: 5.12 Mil/uL (ref 4.22–5.81)
RDW: 12.7 % (ref 11.5–15.5)
WBC: 17.1 10*3/uL — ABNORMAL HIGH (ref 4.0–10.5)

## 2018-02-16 LAB — COMPREHENSIVE METABOLIC PANEL
ALT: 30 U/L (ref 0–53)
AST: 29 U/L (ref 0–37)
Albumin: 4.2 g/dL (ref 3.5–5.2)
Alkaline Phosphatase: 55 U/L (ref 39–117)
BUN: 15 mg/dL (ref 6–23)
CO2: 29 mEq/L (ref 19–32)
Calcium: 10 mg/dL (ref 8.4–10.5)
Chloride: 97 mEq/L (ref 96–112)
Creatinine, Ser: 1.53 mg/dL — ABNORMAL HIGH (ref 0.40–1.50)
GFR: 47.39 mL/min — ABNORMAL LOW (ref >60.00)
Glucose, Bld: 129 mg/dL — ABNORMAL HIGH (ref 70–99)
Potassium: 3.5 mEq/L (ref 3.5–5.1)
Sodium: 134 mEq/L — ABNORMAL LOW (ref 135–145)
Total Bilirubin: 1.3 mg/dL — ABNORMAL HIGH (ref 0.2–1.2)
Total Protein: 7.1 g/dL (ref 6.0–8.3)

## 2018-02-16 NOTE — Telephone Encounter (Signed)
Copied from [place] [id number]. Topic: Quick Communication - Lab Results (Clinic Use ONLY) >> Feb 16, 2018 11:14 AM Scherrie Gerlach wrote: Pt calling back for lab results done yesterday, to see if he had the flu or not. Pt would like a call back asap

## 2018-02-16 NOTE — Telephone Encounter (Signed)
Pt aware see result notes. 

## 2018-02-21 ENCOUNTER — Encounter: Payer: Self-pay | Admitting: Sports Medicine

## 2018-02-21 ENCOUNTER — Ambulatory Visit (INDEPENDENT_AMBULATORY_CARE_PROVIDER_SITE_OTHER): Payer: PPO | Admitting: Sports Medicine

## 2018-02-21 VITALS — BP 138/82 | HR 73 | Ht 66.75 in | Wt 211.6 lb

## 2018-02-21 DIAGNOSIS — J3081 Allergic rhinitis due to animal (cat) (dog) hair and dander: Secondary | ICD-10-CM | POA: Diagnosis not present

## 2018-02-21 DIAGNOSIS — G8929 Other chronic pain: Secondary | ICD-10-CM

## 2018-02-21 DIAGNOSIS — M9905 Segmental and somatic dysfunction of pelvic region: Secondary | ICD-10-CM

## 2018-02-21 DIAGNOSIS — M9903 Segmental and somatic dysfunction of lumbar region: Secondary | ICD-10-CM

## 2018-02-21 DIAGNOSIS — R52 Pain, unspecified: Secondary | ICD-10-CM | POA: Diagnosis not present

## 2018-02-21 DIAGNOSIS — M9906 Segmental and somatic dysfunction of lower extremity: Secondary | ICD-10-CM

## 2018-02-21 DIAGNOSIS — M9904 Segmental and somatic dysfunction of sacral region: Secondary | ICD-10-CM

## 2018-02-21 DIAGNOSIS — M9908 Segmental and somatic dysfunction of rib cage: Secondary | ICD-10-CM | POA: Diagnosis not present

## 2018-02-21 DIAGNOSIS — M47816 Spondylosis without myelopathy or radiculopathy, lumbar region: Secondary | ICD-10-CM

## 2018-02-21 DIAGNOSIS — M5442 Lumbago with sciatica, left side: Secondary | ICD-10-CM | POA: Diagnosis not present

## 2018-02-21 DIAGNOSIS — M9902 Segmental and somatic dysfunction of thoracic region: Secondary | ICD-10-CM | POA: Diagnosis not present

## 2018-02-21 DIAGNOSIS — J3089 Other allergic rhinitis: Secondary | ICD-10-CM | POA: Diagnosis not present

## 2018-02-21 DIAGNOSIS — J301 Allergic rhinitis due to pollen: Secondary | ICD-10-CM | POA: Diagnosis not present

## 2018-02-21 NOTE — Progress Notes (Signed)
[name] [name], [place] at [place]  DRAYTON TIEU - 77 y.o. male MRN [id number]  Date of birth: 1941/03/19  Visit Date: 02/21/2018  PCP: [name], MD   Referred by: [name], MD   SUBJECTIVE:   Chief Complaint  Patient presents with  . f/u LBP, LLE pain    Sx were improving until this past Sat. Sx are moderate and radiating the the LLE less often. He has been doing HEP with minimal trouble. He has responded well to OMT in the past.     HPI: Has done well with OMT in the past with good improvements over the past several months however he had an acute viral illness that has worsened his symptoms and caused him to be in bed most of the weekend which has been associated with flare ups in the past.   REVIEW OF SYSTEMS: Denies any new or different changes with bowel or bladder and actually reports a moderate improvement in his day-to-day discomfort secondary to his prostatectomy.  HISTORY:  Prior history reviewed and updated per electronic medical record.  Social History   Occupational History  . Occupation: retired    Fish farm manager: RETIRED  Tobacco Use  . Smoking status: Former Smoker    Packs/day: 1.00    Years: 35.00    Pack years: 35.00    Types: Cigarettes    Last attempt to quit: 06/16/1992    Years since quitting: 25.7  . Smokeless tobacco: Never Used  . Tobacco comment: discussed the AAA but thinks he may have had this   Substance and Sexual Activity  . Alcohol use: Yes    Alcohol/week: 4.0 standard drinks    Types: 4 Shots of liquor per week    Comment: mixed drink  . Drug use: No  . Sexual activity: Never   Social History   Social History Narrative   Married 42 years in 2015. No kids (mumps at age 61)      Retired from [employer] in [place] at [place]: poker, movies and tv, staying active    [place]:   Recent Labs     10/01/17 1703 01/27/18 0900 02/15/18 1603  HGBA1C 5.8*  --   --   CALCIUM 9.6 9.7 10.0  AST 16 26 29   ALT 23 31 30   TSH 1.24  --   --    No problems updated. No specialty comments available.  OBJECTIVE:  VS:  HT:5' 6.75" (169.5 cm)   WT:211 lb 9.6 oz (96 kg)  BMI:33.41    BP:138/82  HR:73bpm  TEMP: ( )  RESP:95 %   PHYSICAL EXAM: CONSTITUTIONAL: Well-developed, Well-nourished and In no acute distress PSYCHIATRIC: Alert & appropriately interactive. and Not depressed or anxious appearing. RESPIRATORY: No increased work of breathing and Trachea Midline EYES: Pupils are equal., EOM intact without nystagmus. and No scleral icterus.  VASCULAR EXAM: Warm and well perfused NEURO: unremarkable  MSK Exam:   lumbar spine  Well aligned No significant deformity. No overlying skin changes TTP over: Bilateral paraspinal musculature.  Left greater than right SI joint pain. No swelling Tightness with straight leg raise bilaterally but no significant weakness and no focal neural tension signs     ASSESSMENT   1. Somatic dysfunction of lower extremity   2. Somatic dysfunction of thoracic region   3. Somatic dysfunction of lumbar region   4.  Somatic dysfunction of rib cage region   5. Somatic dysfunction of pelvis region   6. Somatic dysfunction of sacral region   7. Body aches   8. Lumbar spondylosis   9. Chronic bilateral low back pain with left-sided sciatica     PLAN:  Pertinent additional documentation may be included in corresponding procedure notes, imaging studies, problem based documentation and patient instructions.  Procedures:  Osteopathic manipulation was performed today based on physical exam findings.  Please see procedure note for further information including Osteopathic Exam findings  Medications:  No orders of the defined types were placed in this encounter.   Discussion/Instructions: No problem-specific Assessment & Plan notes found for this  encounter.  Continue previously prescribed home exercise program.  Discussed red flag symptoms that warrant earlier emergent evaluation and patient voices understanding. Activity modifications and the importance of avoiding exacerbating activities (limiting pain to no more than a 4 / 10 during or following activity) recommended and discussed. Has done well with manipulation   At follow up will plan: to consider repeat osteopathic manipulation Return in about 2 weeks (around 03/07/2018) for consideration of repeat Osteopathic Manipulation.          Gerda Diss, [facility] Physician

## 2018-02-21 NOTE — Progress Notes (Signed)
PROCEDURE NOTE : OSTEOPATHIC MANIPULATION The decision today to treat with Osteopathic Manipulative Therapy (OMT) was based on physical exam findings. Verbal consent was obtained following a discussion with the patient regarding the of risks, benefits and potential side effects, including an acute pain flare,post manipulation soreness and need for repeat treatments.     Contraindications to OMT: NONE  Manipulation was performed as below: Regions Treated OMT Techniques Used  1. Thoracic spine 2. Ribs 3. Lumbar spine 4. Pelvis 5. Sacrum 6. Lower extremities  . HVLA . muscle energy . myofascial release . articulatory . soft tissue   The patient tolerated the treatment well and reported Improved symptoms following treatment today. Patient was given medications, exercises, stretches and lifestyle modifications per AVS and verbally.   OSTEOPATHIC/STRUCTURAL EXAM:   T4 -8 Neutral, Rotated LEFT, Sidebent RIGHT Rib 7 Right  Posterior L3 FRS left (Flexed, Rotated & Sidebent) Left psoas spasm Left anterior innonimate R on R sacral torsion left tight hip flexors

## 2018-02-22 ENCOUNTER — Encounter: Payer: Self-pay | Admitting: Family Medicine

## 2018-02-24 ENCOUNTER — Encounter: Payer: Self-pay | Admitting: Sports Medicine

## 2018-02-24 DIAGNOSIS — G4733 Obstructive sleep apnea (adult) (pediatric): Secondary | ICD-10-CM | POA: Diagnosis not present

## 2018-03-01 DIAGNOSIS — J301 Allergic rhinitis due to pollen: Secondary | ICD-10-CM | POA: Diagnosis not present

## 2018-03-01 DIAGNOSIS — J3089 Other allergic rhinitis: Secondary | ICD-10-CM | POA: Diagnosis not present

## 2018-03-01 DIAGNOSIS — J3081 Allergic rhinitis due to animal (cat) (dog) hair and dander: Secondary | ICD-10-CM | POA: Diagnosis not present

## 2018-03-03 ENCOUNTER — Telehealth: Payer: Self-pay | Admitting: Pharmacist

## 2018-03-03 ENCOUNTER — Encounter: Payer: Self-pay | Admitting: Pharmacist

## 2018-03-03 NOTE — Telephone Encounter (Signed)
Last 2 doses of Repatha 140mg  malfunction. Patient instructed to call back with LOT number if possible. I will report problems to manufacturer.  Samples offered to patient but he is okay on supply.

## 2018-03-03 NOTE — Progress Notes (Signed)
This encounter was created in error - please disregard.

## 2018-03-07 ENCOUNTER — Encounter: Payer: Self-pay | Admitting: Sports Medicine

## 2018-03-07 ENCOUNTER — Ambulatory Visit (INDEPENDENT_AMBULATORY_CARE_PROVIDER_SITE_OTHER): Payer: PPO | Admitting: Sports Medicine

## 2018-03-07 VITALS — BP 122/80 | HR 78 | Ht 66.75 in | Wt 213.6 lb

## 2018-03-07 DIAGNOSIS — M9902 Segmental and somatic dysfunction of thoracic region: Secondary | ICD-10-CM | POA: Diagnosis not present

## 2018-03-07 DIAGNOSIS — G8929 Other chronic pain: Secondary | ICD-10-CM

## 2018-03-07 DIAGNOSIS — M47816 Spondylosis without myelopathy or radiculopathy, lumbar region: Secondary | ICD-10-CM

## 2018-03-07 DIAGNOSIS — M5442 Lumbago with sciatica, left side: Secondary | ICD-10-CM

## 2018-03-07 DIAGNOSIS — M9905 Segmental and somatic dysfunction of pelvic region: Secondary | ICD-10-CM

## 2018-03-07 DIAGNOSIS — M9903 Segmental and somatic dysfunction of lumbar region: Secondary | ICD-10-CM

## 2018-03-07 DIAGNOSIS — M9904 Segmental and somatic dysfunction of sacral region: Secondary | ICD-10-CM | POA: Diagnosis not present

## 2018-03-07 DIAGNOSIS — M9908 Segmental and somatic dysfunction of rib cage: Secondary | ICD-10-CM

## 2018-03-07 DIAGNOSIS — J3081 Allergic rhinitis due to animal (cat) (dog) hair and dander: Secondary | ICD-10-CM | POA: Diagnosis not present

## 2018-03-07 DIAGNOSIS — J3089 Other allergic rhinitis: Secondary | ICD-10-CM | POA: Diagnosis not present

## 2018-03-07 DIAGNOSIS — J301 Allergic rhinitis due to pollen: Secondary | ICD-10-CM | POA: Diagnosis not present

## 2018-03-07 NOTE — Progress Notes (Signed)
PROCEDURE NOTE : OSTEOPATHIC MANIPULATION The decision today to treat with Osteopathic Manipulative Therapy (OMT) was based on physical exam findings. Verbal consent was obtained following a discussion with the patient regarding the of risks, benefits and potential side effects, including an acute pain flare,post manipulation soreness and need for repeat treatments.     Contraindications to OMT: NONE  Manipulation was performed as below: Regions Treated & Osteopathic Exam Findings  T6 ERS left (Extended, Rotated & Sidebent) Rib 8 Right  Posterior L4 FRS right (Flexed, Rotated & Sidebent) Right psoas spasm Right anterior innonimate L on L sacral torsion   OMT Techniques Used  . HVLA . muscle energy . myofascial release . soft tissue . facilitated positional release    The patient tolerated the treatment well and reported Improved symptoms following treatment today. Patient was given medications, exercises, stretches and lifestyle modifications per AVS and verbally.

## 2018-03-07 NOTE — Progress Notes (Signed)
[name] Casen Pry[place], [place] at [place] - 77 y.o. male MRN [id number]  Date of birth: [birth year]  Visit Date:   PCP: [name], MD   Referred by: [name], MD   SUBJECTIVE:  Chief Complaint  Patient presents with  . f/u chronic bilateral low back pain with left-sided sciatica    Reports no change in sx since last OV. Sx are worse on Fri/Sat after walking a lot at the hospital. Increased weakness and numbness in L leg. Taking Tylenol almost daily. Doing HEP 5 days weekly. Responds well to OMT.     HPI: Symptoms as above.  Left-sided leg weakness he reports is noticing more so when he is going from sit to stand.  Reports pain is occasionally moderate to severe.  Worsened with prolonged ambulation which he does every Friday when walking in the hospital.  He does have weakness and numbness in the left leg does tend worse at night.  Pain is localized to the left gluteal region radiates down from there.  Otherwise PCP problem oriented charting from the past.  He is performing home exercise program 5 days/week.  REVIEW OF SYSTEMS: He does have a personal history of cancer.  No nighttime disturbances, fevers, chills or night sweats.  Does have chronic changes in his bladder habits from prostate surgery just unchanged.  Otherwise review of systems is unremarkable  HISTORY:  Prior history reviewed and updated per electronic medical record.  Social History   Occupational History  . Occupation: retired    Fish farm manager: RETIRED  Tobacco Use  . Smoking status: Former Smoker    Packs/day: 1.00    Years: 35.00    Pack years: 35.00    Types: Cigarettes    Last attempt to quit: 06/16/1992    Years since quitting: 25.7  . Smokeless tobacco: Never Used  . Tobacco comment: discussed the AAA but thinks he may have had this   Substance and Sexual Activity  . Alcohol use: Yes    Alcohol/week: 4.0 standard  drinks    Types: 4 Shots of liquor per week    Comment: mixed drink  . Drug use: No  . Sexual activity: Never   Social History   Social History Narrative   Married 42 years in 2015. No kids (mumps at age 62)      Retired from [employer] in [place] at [place]: poker, movies and tv, staying active      [place]:  Recent Labs    10/01/17 1703 01/27/18 0900 02/15/18 1603  HGBA1C 5.8*  --   --   CALCIUM 9.6 9.7 10.0  AST 16 26 29   ALT 23 31 30   TSH 1.24  --   --    Problem  Chronic Bilateral Low Back Pain With Left-Sided Sciatica   07/01/2016: MRI lumbar spine significant multilevel degenerative changes with no significant degenerative facet arthropathy at L4-L5 and L5-S1 with grade 1 anterior listhesis of L4-L5.  L4-L5 also has advanced mild stenosis with moderate right more than the foraminal narrowing L5-S1 has moderate left and mild right foraminal narrowing with left subarticular recess narrowing that could affect left S1 nerve root    No specialty comments available.  OBJECTIVE:  VS:  HT:5' 6.75" (169.5 cm)   WT:213 lb 9.6 oz (96.9 kg)  BMI:33.72    BP:122/80  HR:78bpm  TEMP: ( )  RESP:96 %   PHYSICAL EXAM: Adult male in no acute distress alert appropriate.  He has markedly tight hip flexors and hip internal and external rotators bilaterally worse on the left than the right.  He has markedly tight thoracic spine.  Negative straight leg raises bilaterally.  He does have some weakness going from sit to stand with a 3-second sit to stand test.   ASSESSMENT   1. Lumbar spondylosis   2. Chronic bilateral low back pain with left-sided sciatica   3. Somatic dysfunction of thoracic region   4. Somatic dysfunction of lumbar region   5. Somatic dysfunction of rib cage region   6. Somatic dysfunction of pelvis region   7. Somatic dysfunction of sacral region     PLAN:  Pertinent additional documentation may be included in  corresponding procedure notes, imaging studies, problem based documentation and patient instructions.  Procedures:  Osteopathic manipulation was performed today based on physical exam findings.  Please see procedure note for further information including Osteopathic Exam findings  Medications:  No orders of the defined types were placed in this encounter.   Discussion/Instructions: No problem-specific Assessment & Plan notes found for this encounter.  THERAPEUTIC EXERCISE: Discussed the foundation of treatment for this condition is physical therapy and/or daily (5-6 days/week) therapeutic exercises, focusing on core strengthening, coordination, neuromuscular control/reeducation.  Continue previously prescribed home exercise program.  Discussed the underlying features of tight hip flexors leading to crouched, fetal like position that results in spinal column compression.  Including lumbar hyperflexion with hypermobility, thoracic flexion with restrictive rotation and cervical lordosis reversal Discussed red flag symptoms that warrant earlier emergent evaluation and patient voices understanding. Activity modifications and the importance of avoiding exacerbating activities (limiting pain to no more than a 4 / 10 during or following activity) recommended and discussed. Discussed how to increase physical activity and actually recommend him working on going from sit to stand regular basis including up to 5 repetitions at a time.   At follow up will plan: to consider repeat osteopathic manipulation Return in about 2 weeks (around 03/21/2018) for consideration of repeat Osteopathic Manipulation.          Gerda Diss, [facility] Physician

## 2018-03-08 ENCOUNTER — Other Ambulatory Visit: Payer: Self-pay | Admitting: Cardiology

## 2018-03-14 ENCOUNTER — Other Ambulatory Visit: Payer: Self-pay | Admitting: Family Medicine

## 2018-03-21 ENCOUNTER — Encounter: Payer: Self-pay | Admitting: Sports Medicine

## 2018-03-21 ENCOUNTER — Ambulatory Visit (INDEPENDENT_AMBULATORY_CARE_PROVIDER_SITE_OTHER): Payer: PPO | Admitting: Sports Medicine

## 2018-03-21 VITALS — BP 120/76 | HR 86 | Ht 66.75 in | Wt 213.4 lb

## 2018-03-21 DIAGNOSIS — M9903 Segmental and somatic dysfunction of lumbar region: Secondary | ICD-10-CM

## 2018-03-21 DIAGNOSIS — M9908 Segmental and somatic dysfunction of rib cage: Secondary | ICD-10-CM

## 2018-03-21 DIAGNOSIS — M9905 Segmental and somatic dysfunction of pelvic region: Secondary | ICD-10-CM | POA: Diagnosis not present

## 2018-03-21 DIAGNOSIS — J3081 Allergic rhinitis due to animal (cat) (dog) hair and dander: Secondary | ICD-10-CM | POA: Diagnosis not present

## 2018-03-21 DIAGNOSIS — M9902 Segmental and somatic dysfunction of thoracic region: Secondary | ICD-10-CM | POA: Diagnosis not present

## 2018-03-21 DIAGNOSIS — M5442 Lumbago with sciatica, left side: Secondary | ICD-10-CM

## 2018-03-21 DIAGNOSIS — J301 Allergic rhinitis due to pollen: Secondary | ICD-10-CM | POA: Diagnosis not present

## 2018-03-21 DIAGNOSIS — M9904 Segmental and somatic dysfunction of sacral region: Secondary | ICD-10-CM

## 2018-03-21 DIAGNOSIS — M47816 Spondylosis without myelopathy or radiculopathy, lumbar region: Secondary | ICD-10-CM | POA: Diagnosis not present

## 2018-03-21 DIAGNOSIS — G8929 Other chronic pain: Secondary | ICD-10-CM | POA: Diagnosis not present

## 2018-03-21 DIAGNOSIS — J3089 Other allergic rhinitis: Secondary | ICD-10-CM | POA: Diagnosis not present

## 2018-03-21 DIAGNOSIS — M24552 Contracture, left hip: Secondary | ICD-10-CM | POA: Diagnosis not present

## 2018-03-21 NOTE — Progress Notes (Deleted)
  Joshua [name]. [name], [place] at [place] - 77 y.o. male MRN [id number]  Date of birth: [birth year]  Visit Date:   PCP: [name], MD   Referred by: [name], MD   SUBJECTIVE:  Chief Complaint  Patient presents with  . Follow-up    LBP w/ L leg pain and weakness    HPI: ***  REVIEW OF SYSTEMS: ***  HISTORY:  Prior history reviewed and updated per electronic medical record.  Social History   Occupational History  . Occupation: retired    Fish farm manager: RETIRED  Tobacco Use  . Smoking status: Former Smoker    Packs/day: 1.00    Years: 35.00    Pack years: 35.00    Types: Cigarettes    Last attempt to quit: 06/16/1992    Years since quitting: 25.7  . Smokeless tobacco: Never Used  . Tobacco comment: discussed the AAA but thinks he may have had this   Substance and Sexual Activity  . Alcohol use: Yes    Alcohol/week: 4.0 standard drinks    Types: 4 Shots of liquor per week    Comment: mixed drink  . Drug use: No  . Sexual activity: Never   Social History   Social History Narrative   Married 42 years in 2015. No kids (mumps at age 39)      Retired from [employer] in [place], technical [employer]   Volunteers at [place]: poker, movies and tv, staying active    {LBSM:21311}  DATA OBTAINED & REVIEWED:  Recent Labs    10/01/17 1703 01/27/18 0900 02/15/18 1603  HGBA1C 5.8*  --   --   CALCIUM 9.6 9.7 10.0  AST 16 26 29   ALT 23 31 30   TSH 1.24  --   --    No problems updated. No specialty comments available.  OBJECTIVE:  VS:  HT:5' 6.75" (169.5 cm)   WT:213 lb 6.4 oz (96.8 kg)  BMI:33.69    BP:120/76  HR:86bpm  TEMP: ( )  RESP:92 %   PHYSICAL EXAM: ***   ASSESSMENT  No diagnosis found.  PLAN:  Pertinent additional documentation may be included in corresponding procedure notes, imaging studies, problem based documentation and patient instructions.  Procedures:    Osteopathic manipulation was performed today based on physical exam findings.  Please see procedure note for further information including Osteopathic Exam findings  Medications:  No orders of the defined types were placed in this encounter.  Discussion/Instructions: No problem-specific Assessment & Plan notes found for this encounter. {LBSMPLAN (Optional):21556} *** {If any lack of improvement:21883}  {At follow up will plan (Optional):21890} No follow-ups on file.          Gerda Diss, [facility] Sports [facility] Physician

## 2018-03-21 NOTE — Progress Notes (Signed)
[name] [name], [place] at [place] - 77 y.o. male MRN [id number]  Date of birth: [birth year]  Visit Date:   PCP: [name], MD   Referred by: [name], MD   SUBJECTIVE:  Chief Complaint  Patient presents with  . Follow-up    LBP w/ L leg pain and weakness    HPI: Symptoms as above.  Left-sided leg weakness he reports is noticing more so when he is going from sit to stand.  Reports pain is occasionally moderate to severe.  Worsened with prolonged ambulation which he does every Friday when walking in the hospital.  He does have weakness and numbness in the left leg does tend worse at night.  Pain is localized to the left gluteal region radiates down from there.  Otherwise PCP problem oriented charting from the past.  He is performing home exercise program 5 days/week.  REVIEW OF SYSTEMS: He does have a personal history of cancer.  No nighttime disturbances, fevers, chills or night sweats.  Does have chronic changes in his bladder habits from prostate surgery just unchanged.  Otherwise review of systems is unremarkable  HISTORY:  Prior history reviewed and updated per electronic medical record.  Social History   Occupational History  . Occupation: retired    Fish farm manager: RETIRED  Tobacco Use  . Smoking status: Former Smoker    Packs/day: 1.00    Years: 35.00    Pack years: 35.00    Types: Cigarettes    Last attempt to quit: 06/16/1992    Years since quitting: 25.8  . Smokeless tobacco: Never Used  . Tobacco comment: discussed the AAA but thinks he may have had this   Substance and Sexual Activity  . Alcohol use: Yes    Alcohol/week: 4.0 standard drinks    Types: 4 Shots of liquor per week    Comment: mixed drink  . Drug use: No  . Sexual activity: Never   Social History   Social History Narrative   Married 42 years in 2015. No kids (mumps at age 52)      Retired from  [employer]ud[employer] in [place] at [place]: poker, movies and tv, staying active      [place]:  Recent Labs    10/01/17 1703 01/27/18 0900 02/15/18 1603  HGBA1C 5.8*  --   --   CALCIUM 9.6 9.7 10.0  AST 16 26 29   ALT 23 31 30   TSH 1.24  --   --    No problems updated. No specialty comments available.  OBJECTIVE:  VS:  HT:5' 6.75" (169.5 cm)   WT:213 lb 6.4 oz (96.8 kg)  BMI:33.69    BP:120/76  HR:86bpm  TEMP: ( )  RESP:92 %   PHYSICAL EXAM:  Adult male in no acute distress alert appropriate.   He has markedly tight hip flexors and hip internal and external rotators bilaterally worse on the left than the right.   He has markedly tight thoracic spine.   Negative straight leg raises bilaterally.   He does have some weakness going from sit to stand with a 3-second sit to stand test. ASSESSMENT   1. Lumbar spondylosis   2. Chronic bilateral low back pain with left-sided sciatica   3. Left hip flexor tightness   4. Somatic dysfunction of thoracic region   5. Somatic dysfunction of lumbar region  6. Somatic dysfunction of rib cage region   7. Somatic dysfunction of pelvis region   8. Somatic dysfunction of sacral region     PLAN:  Pertinent additional documentation may be included in corresponding procedure notes, imaging studies, problem based documentation and patient instructions.  Procedures:  PROCEDURE NOTE : OSTEOPATHIC MANIPULATION The decision today to treat with Osteopathic Manipulative Therapy (OMT) was based on physical exam findings. Verbal consent was obtained following a discussion with the patient regarding the of risks, benefits and potential side effects, including an acute pain flare,post manipulation soreness and need for repeat treatments.     Contraindications to OMT: NONE  Manipulation was performed as below: Regions Treated & Osteopathic Exam Findings  T6 ERS RIGHT (Extended, Rotated & Sidebent) Rib 8 Right   Posterior L4 FRS LEFT (Flexed, Rotated & Sidebent) Left psoas spasm Left anterior innonimate R on R sacral torsion   OMT Techniques Used  . HVLA . muscle energy . myofascial release . soft tissue . facilitated positional release    The patient tolerated the treatment well and reported Improved symptoms following treatment today. Patient was given medications, exercises, stretches and lifestyle modifications per AVS and verbally.   Medications:  No orders of the defined types were placed in this encounter.   Discussion/Instructions: No problem-specific Assessment & Plan notes found for this encounter.  Discussed the underlying features of tight hip flexors leading to crouched, fetal like position that results in spinal column compression.  Including lumbar hyperflexion with hypermobility, thoracic flexion with restrictive rotation and cervical lordosis reversal.   Osteopathic manipulation was performed today based on physical exam findings.  Patient has responded well to osteopathic manipulation previously the prior manipulation did not provide permanent long lasting relief.  The patient does feel as though there was significant benefit to the prior manipulation and they wished for repeat manipulation today.  They understand that home therapeutic exercises are critical part of the healing/treatment process and will continue with self treatment between now and their next visit as outlined.  The patient understands that the frequency of visits is meant to provide a stimulus to promote the body's own ability to heal and is not meant to be the sole means for improvement in their symptoms. Discussed how to increase physical activity and actually recommend him working on going from sit to stand regular basis including up to 5 repetitions at a time.    Return in about 2 weeks (around 04/04/2018) for consideration of repeat Osteopathic Manipulation.          Gerda Diss, [place]  Sports Medicine Physician

## 2018-03-28 DIAGNOSIS — G4733 Obstructive sleep apnea (adult) (pediatric): Secondary | ICD-10-CM | POA: Diagnosis not present

## 2018-03-29 DIAGNOSIS — N393 Stress incontinence (female) (male): Secondary | ICD-10-CM | POA: Diagnosis not present

## 2018-03-29 DIAGNOSIS — N481 Balanitis: Secondary | ICD-10-CM | POA: Diagnosis not present

## 2018-03-30 DIAGNOSIS — M6281 Muscle weakness (generalized): Secondary | ICD-10-CM | POA: Diagnosis not present

## 2018-03-30 DIAGNOSIS — N393 Stress incontinence (female) (male): Secondary | ICD-10-CM | POA: Diagnosis not present

## 2018-03-31 ENCOUNTER — Telehealth: Payer: Self-pay | Admitting: Neurology

## 2018-03-31 DIAGNOSIS — G609 Hereditary and idiopathic neuropathy, unspecified: Secondary | ICD-10-CM

## 2018-03-31 NOTE — Telephone Encounter (Signed)
All documentation states that pt takes 100mg  tabs - 1Tab AM and 4 Tabs QHS.  He did not respond to my MyChart message asking for clarification.  Do you know if he actually went up to 800mg ?  pls advise

## 2018-03-31 NOTE — Telephone Encounter (Signed)
Would like to get [place] to call in the Gabapentin to the [facility] on [address]. He states that he increased the medication to 800 and needs a RX called in to reflect that change

## 2018-03-31 NOTE — Telephone Encounter (Signed)
Pls send in Rx for Gabapentin 100mg  1 tab TID, then 5 tabs qhs. Thanks

## 2018-04-01 MED ORDER — GABAPENTIN 100 MG PO CAPS: ORAL_CAPSULE | ORAL | 3 refills | Status: DC

## 2018-04-01 NOTE — Telephone Encounter (Signed)
Gabapentin 100mg  #720 with 3 refills Sig = take 1 tab TID, 5 tab QHS  Sent to [facility] on [address].

## 2018-04-04 ENCOUNTER — Ambulatory Visit: Payer: PPO | Admitting: Sports Medicine

## 2018-04-04 ENCOUNTER — Encounter: Payer: Self-pay | Admitting: Sports Medicine

## 2018-04-04 VITALS — BP 118/84 | HR 80 | Ht 66.75 in | Wt 213.0 lb

## 2018-04-04 DIAGNOSIS — M9903 Segmental and somatic dysfunction of lumbar region: Secondary | ICD-10-CM

## 2018-04-04 DIAGNOSIS — M47816 Spondylosis without myelopathy or radiculopathy, lumbar region: Secondary | ICD-10-CM

## 2018-04-04 DIAGNOSIS — M9906 Segmental and somatic dysfunction of lower extremity: Secondary | ICD-10-CM

## 2018-04-04 DIAGNOSIS — G8929 Other chronic pain: Secondary | ICD-10-CM | POA: Diagnosis not present

## 2018-04-04 DIAGNOSIS — M9904 Segmental and somatic dysfunction of sacral region: Secondary | ICD-10-CM

## 2018-04-04 DIAGNOSIS — M9905 Segmental and somatic dysfunction of pelvic region: Secondary | ICD-10-CM

## 2018-04-04 DIAGNOSIS — M5442 Lumbago with sciatica, left side: Secondary | ICD-10-CM

## 2018-04-04 NOTE — Progress Notes (Signed)
Joshua [name]. , [place] [facility] [place]  Joshua [name] - 78 y.o. male MRN [id number]  Date of birth: 1940/12/15  Visit Date: April 04, 2018  PCP: [name], MD   Referred by: [name], MD  SUBJECTIVE:  Chief Complaint  Patient presents with  . f/u LBP    XR L-soine 05/27/2017, MRI L-spine 07/01/2017. Has received epidural injections in the past with Dr. [name]. Has tried Tylenol and Gabapentin. Has been provided with HEP.     HPI: Patient is here for follow-up of his chronic low back and groin pain.  He has been responding well to osteopathic manipulation and he reports this is actually the best that he has felt since his back initially began bothering him.  He has been performing some therapeutic exercises.  Nothing seems to be exacerbating his symptoms.  The manipulation performed last visit has been significantly helpful Denies fevers, chills, recent weight gain or weight loss.  No night sweats. No significant nighttime awakenings due to this issue.  REVIEW OF SYSTEMS: Per HPI  HISTORY:  Prior history reviewed and updated per electronic medical record.  Social History   Occupational History  . Occupation: retired    Fish farm manager: RETIRED  Tobacco Use  . Smoking status: Former Smoker    Packs/day: 1.00    Years: 35.00    Pack years: 35.00    Types: Cigarettes    Last attempt to quit: 06/16/1992    Years since quitting: 25.8  . Smokeless tobacco: Never Used  . Tobacco comment: discussed the AAA but thinks he may have had this   Substance and Sexual Activity  . Alcohol use: Yes    Alcohol/week: 4.0 standard drinks    Types: 4 Shots of liquor per week    Comment: mixed drink  . Drug use: No  . Sexual activity: Never   Social History   Social History Narrative   Married 42 years in 2015. No kids (mumps at age 55)      Retired from [employer] in [place] at [place]:  poker, movies and tv, staying active     [place]:  Recent Labs    10/01/17 1703 01/27/18 0900 02/15/18 1603  HGBA1C 5.8*  --   --   CALCIUM 9.6 9.7 10.0  AST 16 26 29   ALT 23 31 30   TSH 1.24  --   --    No problems updated. No specialty comments available.  OBJECTIVE:  VS:  HT:5' 6.75" (169.5 cm)   WT:213 lb (96.6 kg)  BMI:33.63    BP:118/84  HR:80bpm  TEMP: ( )  RESP:98 %   PHYSICAL EXAM: Adult male.  No acute distress.  Alert and appropriate. His sit to stand test is less than 2 seconds.  He has improved range of motion of his bilateral hips knees. Continues to have a markedly tight left anterior hip capsule. Negative straight leg raises although tight.   ASSESSMENT  1. Lumbar spondylosis   2. Chronic bilateral low back pain with left-sided sciatica   3. Somatic dysfunction of lumbar region   4. Somatic dysfunction of pelvis region   5. Somatic dysfunction of sacral region   6. Somatic dysfunction of lower extremity     PLAN:  Pertinent additional documentation may be included in corresponding procedure notes, imaging studies, problem based documentation and patient instructions. No problem-specific Assessment &  Plan notes found for this encounter.  He is doing quite well and responded well to manipulation.  We will be able to progress not to 3-week follow-up and have him continue working on the home therapeutic exercises he has been performing.  Procedures:  PROCEDURE NOTE: OSTEOPATHIC MANIPULATION   The decision today to treat with Osteopathic Manipulative Therapy (OMT) was based on physical exam findings. Verbal consent was obtained following a discussion with the patient regarding the of risks, benefits and potential side effects, including an acute pain flare,post manipulation soreness and need for repeat treatments.  NONE  Manipulation was performed as below:  [facility] Treated & Osteopathic Exam Findings   L4 FRS LEFT (Flexed, Rotated &  Sidebent) Left psoas spasm Left anterior innonimate R on R sacral torsion Externally rotated left hip.  OMT Techniques Used   HVLA muscle energy myofascial release  The patient tolerated the treatment well and reported Improved symptoms following treatment today. Patient was given medications, exercises, stretches and lifestyle modifications per AVS and verbally.       Activity modifications and the importance of avoiding exacerbating activities (limiting pain to no more than a 4 / 10 during or following activity) recommended and discussed. Discussed red flag symptoms that warrant earlier emergent evaluation and patient voices understanding.     Return in about 3 weeks (around 04/25/2018) for consideration of repeat Osteopathic Manipulation.          Gerda Diss, [facility] Physician

## 2018-04-05 ENCOUNTER — Encounter: Payer: Self-pay | Admitting: Sports Medicine

## 2018-04-05 DIAGNOSIS — L723 Sebaceous cyst: Secondary | ICD-10-CM | POA: Diagnosis not present

## 2018-04-05 DIAGNOSIS — J3081 Allergic rhinitis due to animal (cat) (dog) hair and dander: Secondary | ICD-10-CM | POA: Diagnosis not present

## 2018-04-05 DIAGNOSIS — D692 Other nonthrombocytopenic purpura: Secondary | ICD-10-CM | POA: Diagnosis not present

## 2018-04-05 DIAGNOSIS — J3089 Other allergic rhinitis: Secondary | ICD-10-CM | POA: Diagnosis not present

## 2018-04-05 DIAGNOSIS — J301 Allergic rhinitis due to pollen: Secondary | ICD-10-CM | POA: Diagnosis not present

## 2018-04-05 DIAGNOSIS — L821 Other seborrheic keratosis: Secondary | ICD-10-CM | POA: Diagnosis not present

## 2018-04-11 DIAGNOSIS — N393 Stress incontinence (female) (male): Secondary | ICD-10-CM | POA: Diagnosis not present

## 2018-04-11 DIAGNOSIS — M6281 Muscle weakness (generalized): Secondary | ICD-10-CM | POA: Diagnosis not present

## 2018-04-16 ENCOUNTER — Encounter: Payer: Self-pay | Admitting: Cardiology

## 2018-04-16 ENCOUNTER — Encounter: Payer: Self-pay | Admitting: Sports Medicine

## 2018-04-16 NOTE — Assessment & Plan Note (Signed)
This is both functional and structural in nature. He is responding well to osteopathic manipulation and home therapeutic exercises but he does tend to over exert himself.

## 2018-04-16 NOTE — Progress Notes (Signed)
HPI  The patient returns for follow up of CAD.  He had a negative perfusion study in 2015.   Since I last saw him he has started to have some discomfort.  He notices this when he walks uphill.  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  The time he gets to the top and sits down he notices a 4 out of 10 discomfort.  It is a burning discomfort.  There is no radiation to his jaw or to his arms.  He had this prior to his bypass.  He goes away after a minute.  He has not had to take any nitroglycerin.  There is no associated nausea vomiting or diaphoresis.  He does not feel exceptionally short of breath.  He is limited by some back problems.  He can do lots of other activities around the hospital as a volunteer without bringing on this discomfort.  He does not have any resting complaints.   Allergies  Allergen Reactions  . 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 Bromfed Nausea And Vomiting  . Clarithromycin Other (See Comments)     gastritis  . Codeine Other (See Comments)    Trouble breathing  . Doxycycline Hyclate Nausea And Vomiting  . Modafinil Other (See Comments)     anxiety-nervousness  . Oxycodone-Acetaminophen Itching  . Promethazine Hcl Other (See Comments)     fainting  . Quinolones     Cipro  . Telithromycin Nausea And Vomiting  . Hydrocodone-Acetaminophen Rash  . Sulfonamide Derivatives Rash    Current Outpatient Medications  Medication Sig Dispense Refill  . acetaminophen (TYLENOL) 500 MG tablet Take 1,000 mg by mouth every 6 (six) hours as needed for headache (pain). Reported on 07/02/2015    . albuterol (PROAIR HFA) 108 (90 BASE) MCG/ACT inhaler Inhale 1-2 puffs into the lungs every 6 (six) hours as needed for wheezing or shortness of breath. 18 g 3  . aspirin EC 81 MG tablet Take 81 mg by mouth daily.    . clotrimazole-betamethasone (LOTRISONE) cream USE 0.5 CM TRRANSDERMALLY TWICE  A DAY  6  . Cyanocobalamin (VITAMIN B 12 PO) Take 800 mg by mouth daily.    [name] desloratadine (CLARINEX) 5 MG tablet Take 5 mg by mouth at bedtime.     . diazepam (VALIUM) 5 MG tablet Take 1 tablet (5 mg total) by mouth at bedtime as needed. 30 tablet 1  . Evolocumab (REPATHA SURECLICK) 800 MG/ML SOAJ Inject 140 mg into the skin every 14 (fourteen) days. 2 pen 11  . FLOVENT HFA 110 MCG/ACT inhaler Inhale 2 puffs into the lungs 2 (two) times daily. Reported on 03/22/2015  5  . fluticasone (FLONASE) 50 MCG/ACT nasal spray Place 1 spray into both nostrils 2 (two) times daily.     . folic acid (FOLVITE) 349 MCG tablet Take 400 mcg by mouth daily.    [name] gabapentin (NEURONTIN) 100 MG capsule Take 1 cap three times a day, 5 caps at bedtime 720 capsule 3  . hydrochlorothiazide (HYDRODIURIL) 25 MG tablet TAKE 1 TABLET BY MOUTH EVERY DAY 90 tablet 3  . ketoconazole (NIZORAL) 2 % cream Apply topically 2 (two) times daily.    [name] losartan (COZAAR) 100 MG tablet Take 1 tablet (100 mg total) by mouth daily. 90 tablet 3  . NON FORMULARY     . nystatin (  MYCOSTATIN/NYSTOP) powder Apply topically 4 (four) times daily. 15 g 0  . omeprazole (PRILOSEC) 20 MG capsule TAKE 1 CAPSULE BY MOUTH EVERY DAY 90 capsule 1  . ondansetron (ZOFRAN ODT) 8 MG disintegrating tablet Take 1 tablet (8 mg total) by mouth every 8 (eight) hours as needed for nausea or vomiting. 20 tablet 0  . polyethylene glycol powder (GLYCOLAX/MIRALAX) powder Take 17 g by mouth daily. 255 g 0  . Probiotic Product (ALIGN) 4 MG CAPS Take 4 mg by mouth daily.     . simvastatin (ZOCOR) 20 MG tablet TAKE 1 TABLET BY MOUTH EVERYDAY AT BEDTIME 90 tablet 0   No current facility-administered medications for this visit.     Past Medical History:  Diagnosis Date  . Acute medial meniscal tear   . Anemia   . Arthritis    "middle finger right hand; right knee; neck" (02/06/2014)  . Asthma   . Bladder cancer ([place]) 04/2010   "cauterized during prostate OR"  . CAD  (coronary artery disease)    CABG 2001  . Depression   . Diverticulosis   . DJD (degenerative joint disease)   . GERD (gastroesophageal reflux disease)   . History of gout   . Hyperlipidemia   . Hypertension   . IBS (irritable bowel syndrome)   . Obesity   . OSA (obstructive sleep apnea)   . Pancreatitis ~ 1980  . Prostate cancer ([place]) 04/2010    Past Surgical History:  Procedure Laterality Date  . ANTERIOR CERVICAL DECOMP/DISCECTOMY FUSION  05/26/06  . BACK SURGERY    . CARDIAC CATHETERIZATION  11/20/1999  . CORONARY ARTERY BYPASS GRAFT  11/20/1999   SVG-RI1-RI2, SVG-OM, SVG-dRCA  . KNEE ARTHROSCOPY Right 1992; 04/2008  . LAPAROSCOPIC CHOLECYSTECTOMY  2007  . NASAL SEPTUM SURGERY Right 2007  . ROBOT ASSISTED LAPAROSCOPIC RADICAL PROSTATECTOMY  04/2010  . THYROIDECTOMY, PARTIAL  1980    ROS:  Positive for back pain and cold like symptoms.  Otherwise as stated in the HPI and negative for all other systems.   PHYSICAL EXAM BP 125/73   Pulse 74   Ht 5\' 6"  (1.676 m)   Wt 214 lb 9.6 oz (97.3 kg)   BMI 34.64 kg/m   GENERAL:  Well appearing NECK:  No jugular venous distention, waveform within normal limits, carotid upstroke brisk and symmetric, no bruits, no thyromegaly LUNGS:  Clear to auscultation bilaterally CHEST:  Well healed sternotomy scar. HEART:  PMI not displaced or sustained,S1 and S2 within normal limits, no S3, no S4, no clicks, no rubs, no murmurs ABD:  Flat, positive bowel sounds normal in frequency in pitch, no bruits, no rebound, no guarding, no midline pulsatile mass, no hepatomegaly, no splenomegaly EXT:  2 plus pulses throughout, no edema, no cyanosis no clubbing   EKG: 02/15/2018 sinus rhythm, rate 83, axis within normal limits, intervals within normal limits, premature ectopic complexes, no acute ST-T wave changes.  Lab Results  Component Value Date   CHOL 95 01/27/2018   TRIG 149 01/27/2018   HDL 49 01/27/2018   LDLCALC 16 01/27/2018   LDLDIRECT 115.0  06/04/2016    ASSESSMENT AND PLAN   CORONARY ARTERY DISEASE -  The patient has exertional angina.  It seems to be a relatively stable pattern however.  At this point I am going to screen him with a POET (Plain Old Exercise Treadmill).  I will be looking for high risk findings.  In addition we had a discussion about the fact that if  his symptoms progress I would likely need to consider further evaluation even in the face of a stress test if it is normal.  For now he will continue the meds as listed.  HTN - The blood pressure is at target.  No change in therapy.   DYSLIPIDEMIA - He is now on Repatha.  LDL is excellent.  No change in therapy.   OVERWEIGHT - This is been difficult to manage particularly since his activity is limited with his back pain.  I encouraged good diet and as much activity as he can tolerate.

## 2018-04-18 ENCOUNTER — Ambulatory Visit (INDEPENDENT_AMBULATORY_CARE_PROVIDER_SITE_OTHER): Payer: PPO | Admitting: Family Medicine

## 2018-04-18 ENCOUNTER — Encounter: Payer: Self-pay | Admitting: Cardiology

## 2018-04-18 ENCOUNTER — Ambulatory Visit: Payer: PPO | Admitting: Cardiology

## 2018-04-18 ENCOUNTER — Encounter: Payer: Self-pay | Admitting: Family Medicine

## 2018-04-18 VITALS — BP 125/73 | HR 74 | Ht 66.0 in | Wt 214.6 lb

## 2018-04-18 VITALS — BP 118/74 | HR 70 | Temp 97.6°F | Ht 66.0 in | Wt 214.8 lb

## 2018-04-18 DIAGNOSIS — I208 Other forms of angina pectoris: Secondary | ICD-10-CM | POA: Diagnosis not present

## 2018-04-18 DIAGNOSIS — I10 Essential (primary) hypertension: Secondary | ICD-10-CM | POA: Diagnosis not present

## 2018-04-18 DIAGNOSIS — E785 Hyperlipidemia, unspecified: Secondary | ICD-10-CM

## 2018-04-18 DIAGNOSIS — J0100 Acute maxillary sinusitis, unspecified: Secondary | ICD-10-CM | POA: Diagnosis not present

## 2018-04-18 DIAGNOSIS — I2089 Other forms of angina pectoris: Secondary | ICD-10-CM | POA: Insufficient documentation

## 2018-04-18 MED ORDER — AMOXICILLIN-POT CLAVULANATE 875-125 MG PO TABS: 1.0000 | ORAL_TABLET | Freq: Two times a day (BID) | ORAL | 0 refills | Status: DC

## 2018-04-18 NOTE — Progress Notes (Signed)
Patient: Joshua [name] MRN: [id number] DOB: May 02, 1940 PCP: [name], MD     Subjective:  Chief Complaint  Patient presents with  . Sore Throat  . Cough  . Nasal Congestion    HPI: The patient is a 78 y.o. male who presents today for sore throat, headache. He states his symptoms started about one week ago with GI issues. He started to have BM about 5x/day and a lot of stomach rumbling. This cleared up around Friday and then he started to have upper respiratory symptoms. He states his throat started to get sore last night while playing poker and was really congested. This AM he woke up and his throat felt better, but he was still quite congested. He also felt chest tightness. He started to feel better today, but came in today to make sure.  He does have a hx of asthma. No copd. No smoking. He denies any wheezing/shortness of breath and states more in his head/nose/eyes. He does use flonase and claritin daily. He also gets allergy shot bi-weekly. He states his teeth don't really hurt, but has "sick taste" in his mouth.   Exertional angina: seen by cardiology today. Setting up treadmill stress test. Hx of 5-artery  bypass in 2001.   Review of Systems  Constitutional: Negative for chills and fever.  HENT: Positive for congestion, sinus pressure, sinus pain and sore throat. Negative for ear pain, postnasal drip and rhinorrhea.   Eyes: Positive for pain.       Also c/o feeling like b/l eyes are swelling  Respiratory: Positive for cough and chest tightness. Negative for shortness of breath.   Cardiovascular: Negative for chest pain.          Neurological: Positive for headaches. Negative for dizziness.    Allergies Patient is allergic to benadryl [diphenhydramine]; bromfed; clarithromycin; codeine; doxycycline hyclate; modafinil; oxycodone-acetaminophen; promethazine hcl; quinolones; telithromycin; hydrocodone-acetaminophen; and sulfonamide derivatives.  Past Medical  History Patient  has a past medical history of Acute medial meniscal tear, Anemia, Arthritis, Asthma, Bladder cancer ([place]) (04/2010), CAD (coronary artery disease), Depression, Diverticulosis, DJD (degenerative joint disease), GERD (gastroesophageal reflux disease), History of gout, Hyperlipidemia, Hypertension, IBS (irritable bowel syndrome), Obesity, OSA (obstructive sleep apnea), Pancreatitis (~ 1980), and Prostate cancer ([place]) (04/2010).  Surgical History Patient  has a past surgical history that includes Thyroidectomy, partial (1980); Anterior cervical decomp/discectomy fusion (05/26/06); Knee arthroscopy (Right, 1992; 04/2008); Robot assisted laparoscopic radical prostatectomy (04/2010); Nasal septum surgery (Right, 2007); Back surgery; Laparoscopic cholecystectomy (2007); Cardiac catheterization (11/20/1999); and Coronary artery bypass graft (11/20/1999).  Family History Pateint's family history includes Diabetes in his brother; Heart disease in his father and mother; Hyperlipidemia in his father and mother.  Social History Patient  reports that he quit smoking about 25 years ago. His smoking use included cigarettes. He has a 35.00 pack-year smoking history. He has never used smokeless tobacco. He reports current alcohol use of about 4.0 standard drinks of alcohol per week. He reports that he does not use drugs.    Objective: Vitals:   04/18/18 1352  BP: 118/74  Pulse: 70  Temp: 97.6 F (36.4 C)  TempSrc: Oral  SpO2: 97%  Weight: 214 lb 12.8 oz (97.4 kg)  Height: 5\' 6"  (1.676 m)    Body mass index is 34.67 kg/m.  Physical Exam Vitals signs reviewed.  Constitutional:      Appearance: He is obese.  HENT:     Right Ear: Tympanic membrane and ear canal normal.  Left Ear: Tympanic membrane and ear canal normal.     Nose: Congestion present. No rhinorrhea.     Comments: TTp over sinuses     Mouth/Throat:     Mouth: Mucous membranes are moist.     Pharynx: Posterior oropharyngeal  erythema present. No oropharyngeal exudate.     Tonsils: No tonsillar exudate.     Comments: Enlarged submandibular gland on left  Neck:     Musculoskeletal: Normal range of motion and neck supple.  Cardiovascular:     Rate and Rhythm: Normal rate and regular rhythm.     Heart sounds: No murmur.  Pulmonary:     Effort: Pulmonary effort is normal. No respiratory distress.     Breath sounds: Normal breath sounds. No wheezing or rales.  Abdominal:     General: Bowel sounds are normal.     Palpations: Abdomen is soft.  Lymphadenopathy:     Cervical: No cervical adenopathy.  Skin:    General: Skin is warm.     Capillary Refill: Capillary refill takes less than 2 seconds.  Neurological:     Mental Status: He is alert.        Assessment/plan: 1. Subacute maxillary sinusitis Appears viral in nature. Continue allergy medication and recommended giving this more time. Will send in abx, but he will not start/pick up unless fever/worsening symptoms or persisting past 10 days. Cardiology has him set up for ETT already for exertional angina. Cardiology note reviewed. ER precautions given. Side effects of medication discussed if he starts this. Recommended taking with probiotic.  - amoxicillin-clavulanate (AUGMENTIN) 875-125 MG tablet; Take 1 tablet by mouth 2 (two) times daily.  Dispense: 20 tablet; Refill: 0     Return if symptoms worsen or fail to improve.   Orma Flaming, MD [place]   04/18/2018

## 2018-04-18 NOTE — Patient Instructions (Signed)
Medication Instructions:  Continue current medications  If you need a refill on your cardiac medications before your next appointment, please call your pharmacy.  Labwork: None Ordered   Take the provided lab slips with you to the lab for your blood draw.   When you have your labs (blood work) drawn today and your tests are completely normal, you will receive your results only by MyChart Message (if you have MyChart) -OR-  A paper copy in the mail.  If you have any lab test that is abnormal or we need to change your treatment, we will call you to review these results.  Testing/Procedures: Your physician has requested that you have an exercise tolerance test. For further information please visit [email or web address]. Please also follow instruction sheet, as given.   Follow-Up: You will need a follow up appointment in 4 months.  Please call our office 2 months in advance to schedule this appointment.  You may see [name], MD or one of the following Advanced Practice Providers on your designated Care Team:   Rosaria Ferries, PA-C . Jory Sims, DNP, ANP   At [facility], you and your health needs are our priority.  As part of our continuing mission to provide you with exceptional heart care, we have created designated Provider Care Teams.  These Care Teams include your primary Cardiologist (physician) and Advanced Practice Providers (APPs -  Physician Assistants and Nurse Practitioners) who all work together to provide you with the care you need, when you need it.  Thank you for choosing [facility] at [facility]!!

## 2018-04-18 NOTE — Patient Instructions (Signed)
I would wait 7+more days on starting antibiotic. I think this is all viral. Continue flonase, cool mist humidifier and allergy medication. Would start medication if worsening symptoms or if not better by 10 days.     Sinusitis, Adult Sinusitis is soreness and swelling (inflammation) of your sinuses. Sinuses are hollow spaces in the bones around your face. They are located:  Around your eyes.  In the middle of your forehead.  Behind your nose.  In your cheekbones. Your sinuses and nasal passages are lined with a fluid called mucus. Mucus drains out of your sinuses. Swelling can trap mucus in your sinuses. This lets germs (bacteria, virus, or fungus) grow, which leads to infection. Most of the time, this condition is caused by a virus. What are the causes? This condition is caused by:  Allergies.  Asthma.  Germs.  Things that block your nose or sinuses.  Growths in the nose (nasal polyps).  Chemicals or irritants in the air.  Fungus (rare). What increases the risk? You are more likely to develop this condition if:  You have a weak body defense system (immune system).  You do a lot of swimming or diving.  You use nasal sprays too much.  You smoke. What are the signs or symptoms? The main symptoms of this condition are pain and a feeling of pressure around the sinuses. Other symptoms include:  Stuffy nose (congestion).  Runny nose (drainage).  Swelling and warmth in the sinuses.  Headache.  Toothache.  A cough that may get worse at night.  Mucus that collects in the throat or the back of the nose (postnasal drip).  Being unable to smell and taste.  Being very tired (fatigue).  A fever.  Sore throat.  Bad breath. How is this diagnosed? This condition is diagnosed based on:  Your symptoms.  Your medical history.  A physical exam.  Tests to find out if your condition is short-term (acute) or long-term (chronic). Your doctor may: ? Check your nose  for growths (polyps). ? Check your sinuses using a tool that has a light (endoscope). ? Check for allergies or germs. ? Do imaging tests, such as an MRI or CT scan. How is this treated? Treatment for this condition depends on the cause and whether it is short-term or long-term.  If caused by a virus, your symptoms should go away on their own within 10 days. You may be given medicines to relieve symptoms. They include: ? Medicines that shrink swollen tissue in the nose. ? Medicines that treat allergies (antihistamines). ? A spray that treats swelling of the nostrils. ? Rinses that help get rid of thick mucus in your nose (nasal saline washes).  If caused by bacteria, your doctor may wait to see if you will get better without treatment. You may be given antibiotic medicine if you have: ? A very bad infection. ? A weak body defense system.  If caused by growths in the nose, you may need to have surgery. Follow these instructions at home: Medicines  Take, use, or apply over-the-counter and prescription medicines only as told by your doctor. These may include nasal sprays.  If you were prescribed an antibiotic medicine, take it as told by your doctor. Do not stop taking the antibiotic even if you start to feel better. Hydrate and humidify   Drink enough water to keep your pee (urine) pale yellow.  Use a cool mist humidifier to keep the humidity level in your home above 50%.  Breathe  in steam for 10-15 minutes, 3-4 times a day, or as told by your doctor. You can do this in the bathroom while a hot shower is running.  Try not to spend time in cool or dry air. Rest  Rest as much as you can.  Sleep with your head raised (elevated).  Make sure you get enough sleep each night. General instructions   Put a warm, moist washcloth on your face 3-4 times a day, or as often as told by your doctor. This will help with discomfort.  Wash your hands often with soap and water. If there is no  soap and water, use hand sanitizer.  Do not smoke. Avoid being around people who are smoking (secondhand smoke).  Keep all follow-up visits as told by your doctor. This is important. Contact a doctor if:  You have a fever.  Your symptoms get worse.  Your symptoms do not get better within 10 days. Get help right away if:  You have a very bad headache.  You cannot stop throwing up (vomiting).  You have very bad pain or swelling around your face or eyes.  You have trouble seeing.  You feel confused.  Your neck is stiff.  You have trouble breathing. Summary  Sinusitis is swelling of your sinuses. Sinuses are hollow spaces in the bones around your face.  This condition is caused by tissues in your nose that become inflamed or swollen. This traps germs. These can lead to infection.  If you were prescribed an antibiotic medicine, take it as told by your doctor. Do not stop taking it even if you start to feel better.  Keep all follow-up visits as told by your doctor. This is important. This information is not intended to replace advice given to you by your health care provider. Make sure you discuss any questions you have with your health care provider. Document Released: 08/26/2007 Document Revised: 08/09/2017 Document Reviewed: 08/09/2017 Elsevier Interactive Patient Education  2019 [employer].

## 2018-04-21 ENCOUNTER — Telehealth (HOSPITAL_COMMUNITY): Payer: Self-pay

## 2018-04-21 NOTE — Telephone Encounter (Signed)
Encounter complete. 

## 2018-04-22 ENCOUNTER — Ambulatory Visit: Payer: Self-pay | Admitting: *Deleted

## 2018-04-22 ENCOUNTER — Ambulatory Visit (INDEPENDENT_AMBULATORY_CARE_PROVIDER_SITE_OTHER): Payer: PPO | Admitting: Family Medicine

## 2018-04-22 ENCOUNTER — Encounter: Payer: Self-pay | Admitting: Family Medicine

## 2018-04-22 VITALS — BP 140/86 | HR 77 | Temp 97.5°F | Ht 66.0 in | Wt 214.8 lb

## 2018-04-22 DIAGNOSIS — J0100 Acute maxillary sinusitis, unspecified: Secondary | ICD-10-CM | POA: Diagnosis not present

## 2018-04-22 DIAGNOSIS — B9789 Other viral agents as the cause of diseases classified elsewhere: Secondary | ICD-10-CM

## 2018-04-22 DIAGNOSIS — J069 Acute upper respiratory infection, unspecified: Secondary | ICD-10-CM

## 2018-04-22 NOTE — Progress Notes (Signed)
Patient: Joshua [name] MRN: [id number] DOB: Jan 08, 1941 PCP: [name], MD     Subjective:  Chief Complaint  Patient presents with  . Cough    HPI: The patient is a 78 y.o. male who presents today for a cough. I saw him on 04/18/18 for sinus symptoms, but we treated him conservatively. He has stayed home all week. He states his sinus symptoms are improving, but he has started to cough and has pressure in his chest and it hurts to touch. He feels like this is getting worse. He has taken 3 of his augmentin pills. He is worried about pneumonia. No fever/chills. He still has some sinus pain/pressure, but is on the augmentin. He states his cough is dry in nature. The coughing is getting worse. No shortness of breath and no wheezing. Denies any cardiac pain/pressure. No radiation down left arm or up jaw. No diaphoresis. His upper teeth do hurt him.   Review of Systems  Constitutional: Positive for fatigue. Negative for chills and fever.  HENT: Positive for congestion, sinus pressure and sinus pain. Negative for ear pain, postnasal drip, rhinorrhea and sore throat.   Respiratory: Positive for cough and chest tightness. Negative for shortness of breath.   Cardiovascular: Negative for chest pain.       Pt c/o chronic chest pressure  Gastrointestinal: Negative for abdominal pain and nausea.  Musculoskeletal: Positive for back pain.  Neurological: Positive for headaches. Negative for dizziness.  Psychiatric/Behavioral: Negative for sleep disturbance.    Allergies Patient is allergic to benadryl [diphenhydramine]; bromfed; clarithromycin; codeine; doxycycline hyclate; modafinil; oxycodone-acetaminophen; promethazine hcl; quinolones; telithromycin; hydrocodone-acetaminophen; and sulfonamide derivatives.  Past Medical History Patient  has a past medical history of Acute medial meniscal tear, Anemia, Arthritis, Asthma, Bladder cancer ([place]) (04/2010), CAD (coronary artery disease),  Depression, Diverticulosis, DJD (degenerative joint disease), GERD (gastroesophageal reflux disease), History of gout, Hyperlipidemia, Hypertension, IBS (irritable bowel syndrome), Obesity, OSA (obstructive sleep apnea), Pancreatitis (~ 1980), and Prostate cancer ([place]) (04/2010).  Surgical History Patient  has a past surgical history that includes Thyroidectomy, partial (1980); Anterior cervical decomp/discectomy fusion (05/26/06); Knee arthroscopy (Right, 1992; 04/2008); Robot assisted laparoscopic radical prostatectomy (04/2010); Nasal septum surgery (Right, 2007); Back surgery; Laparoscopic cholecystectomy (2007); Cardiac catheterization (11/20/1999); and Coronary artery bypass graft (11/20/1999).  Family History Pateint's family history includes Diabetes in his brother; Heart disease in his father and mother; Hyperlipidemia in his father and mother.  Social History Patient  reports that he quit smoking about 25 years ago. His smoking use included cigarettes. He has a 35.00 pack-year smoking history. He has never used smokeless tobacco. He reports current alcohol use of about 4.0 standard drinks of alcohol per week. He reports that he does not use drugs.    Objective: Vitals:   04/22/18 1145  BP: 140/86  Pulse: 77  Temp: (!) 97.5 F (36.4 C)  TempSrc: Oral  SpO2: 97%  Weight: 214 lb 12.8 oz (97.4 kg)  Height: 5\' 6"  (1.676 m)    Body mass index is 34.67 kg/m.  Physical Exam Vitals signs reviewed.  Constitutional:      Appearance: He is obese.  HENT:     Right Ear: Ear canal normal.     Left Ear: Ear canal normal.     Nose: Nose normal.     Comments: TTP over bilateral maxillary sinuses Neck:     Musculoskeletal: Normal range of motion and neck supple.  Cardiovascular:     Rate and Rhythm: Normal rate and regular  rhythm.     Pulses: Normal pulses.  Pulmonary:     Effort: Pulmonary effort is normal. No respiratory distress.     Breath sounds: Normal breath sounds. No wheezing or  rales.  Abdominal:     General: Abdomen is flat. Bowel sounds are normal.     Palpations: Abdomen is soft.  Lymphadenopathy:     Cervical: No cervical adenopathy.  Skin:    Capillary Refill: Capillary refill takes less than 2 seconds.  Neurological:     General: No focal deficit present.     Mental Status: He is alert and oriented to person, place, and time.        Assessment/plan: 1. Viral URI with cough Only has had for a few days. Exam clear. Already on augmentin. No concern for pneumonia, discussed could be bronchitis, but need 2 weeks of coughing. Finish full course of augmentin, no indication for CXR today, but if fever or worsening symptoms I want him to come back for CXR. He has stress test set up for 2/4. Precautions given. Can not take any px cough syrup and declines tessalon pearls. Continue with other supportive therapy.   2. Acute non-recurrent maxillary sinusitis Started his augmentin and I agree with worsening symptoms and teeth hurting. Continue full 10 days.    Return if symptoms worsen or fail to improve.   Orma Flaming, MD [place]   04/22/2018

## 2018-04-22 NOTE — Telephone Encounter (Signed)
Patient is calling because he was treated by Dr. Rogers Blocker for a sinus infection. The patient feels that it is now settling into his chest. The patient is concerned about pneumonia. Does he need to be rechecked or continue to take Augmentin. Please advise   Call to patient - patient states he has progressively gotten worse this week and he started the Rx for antibiotic yesterday. Patient is coughing and he is very concerned about his chest congestion and his history. Patient states he is very limited on the medications he can use for treatment and he does not want to get into trouble with this cough with his heart history. Although patient has just started antibiotic- appointment scheduled to alleviate his concerns and possibly help him control his cough symptoms.   Reason for Disposition . [1] Continuous (nonstop) coughing interferes with work or school AND [2] no improvement using cough treatment per protocol    Patient concerned about cough and limited treatment options. He is also concerned about his heart history and pneumonia history. Appointment scheduled  Answer Assessment - Initial Assessment Questions 1. ONSET: "When did the cough begin?"      Started with sinus infection-got worse Wednesday night 2. SEVERITY: "How bad is the cough today?"     more frequent and congested 3. RESPIRATORY DISTRESS: "Describe your breathing."      Not at this point- patient is more conscious of breathing 4. FEVER: "Do you have a fever?" If so, ask: "What is your temperature, how was it measured, and when did it start?"     No fever 5. HEMOPTYSIS: "Are you coughing up any blood?" If so ask: "How much?" (flecks, streaks, tablespoons, etc.)     Not move congestion up 6. TREATMENT: "What have you done so far to treat the cough?" (e.g., meds, fluids, humidifier)     No- humidifer 7. CARDIAC HISTORY: "Do you have any history of heart disease?" (e.g., heart attack, congestive heart failure)      Yes- bypass 8. LUNG  HISTORY: "Do you have any history of lung disease?"  (e.g., pulmonary embolus, asthma, emphysema)     asthma 9. PE RISK FACTORS: "Do you have a history of blood clots?" (or: recent major surgery, recent prolonged travel, bedridden)     no 10. OTHER SYMPTOMS: "Do you have any other symptoms? (e.g., runny nose, wheezing, chest pain)       Sinus are better- seem to be migrating to chest 11. PREGNANCY: "Is there any chance you are pregnant?" "When was your last menstrual period?"       n/a 12. TRAVEL: "Have you traveled out of the country in the last month?" (e.g., travel history, exposures)       n/a  Protocols used: COUGH - ACUTE NON-PRODUCTIVE-A-AH

## 2018-04-22 NOTE — Patient Instructions (Addendum)
You are on right antibiotic. Make sure you finish the full course of 10 days. Cool mist humidifier, honey. Let us know if you are getting worse because you will need CXR. The augmentin does cover the lung, but if pneumonia, I add on a second antibiotic.   Rest up and let us know if not getting better.  Good to see you!   Dr. Rogers Blocker

## 2018-04-25 ENCOUNTER — Encounter: Payer: Self-pay | Admitting: Sports Medicine

## 2018-04-25 ENCOUNTER — Ambulatory Visit (INDEPENDENT_AMBULATORY_CARE_PROVIDER_SITE_OTHER): Payer: PPO | Admitting: Sports Medicine

## 2018-04-25 VITALS — BP 114/78 | HR 85 | Ht 66.0 in | Wt 211.6 lb

## 2018-04-25 DIAGNOSIS — M9906 Segmental and somatic dysfunction of lower extremity: Secondary | ICD-10-CM | POA: Diagnosis not present

## 2018-04-25 DIAGNOSIS — J301 Allergic rhinitis due to pollen: Secondary | ICD-10-CM | POA: Diagnosis not present

## 2018-04-25 DIAGNOSIS — M47816 Spondylosis without myelopathy or radiculopathy, lumbar region: Secondary | ICD-10-CM

## 2018-04-25 DIAGNOSIS — J3089 Other allergic rhinitis: Secondary | ICD-10-CM | POA: Diagnosis not present

## 2018-04-25 DIAGNOSIS — G8929 Other chronic pain: Secondary | ICD-10-CM | POA: Diagnosis not present

## 2018-04-25 DIAGNOSIS — J3081 Allergic rhinitis due to animal (cat) (dog) hair and dander: Secondary | ICD-10-CM | POA: Diagnosis not present

## 2018-04-25 DIAGNOSIS — M5442 Lumbago with sciatica, left side: Secondary | ICD-10-CM

## 2018-04-25 DIAGNOSIS — M9905 Segmental and somatic dysfunction of pelvic region: Secondary | ICD-10-CM

## 2018-04-25 DIAGNOSIS — M9903 Segmental and somatic dysfunction of lumbar region: Secondary | ICD-10-CM

## 2018-04-25 DIAGNOSIS — M9904 Segmental and somatic dysfunction of sacral region: Secondary | ICD-10-CM

## 2018-04-25 DIAGNOSIS — M9902 Segmental and somatic dysfunction of thoracic region: Secondary | ICD-10-CM

## 2018-04-25 NOTE — Progress Notes (Signed)
Joshua [name]. Geniva [name], Joshua [name] at [place]  [name] - 78 y.o. male MRN [id number]  Date of birth: [birth year]  Visit Date: April 25, 2018  PCP: [name], MD   Referred by: [name], MD  SUBJECTIVE:  Chief Complaint  Patient presents with  . Follow-up    Low back pain.  Lumbar spondylosis.  OMT.  L-spine XR- 05/27/17.  L-spine MRI- 07/01/17.    HPI: Patient reports doing continuously better from the standpoint of his low back and leg pain.  He reports the pain is mild at this time and rated only as a 2-3 out of 10.  He is remained overall active although he has slightly cut back due to an acute illness recently.  Reports that the most beneficial intervention for him has been the osteopathic manipulation.  He is continue to take Tylenol and gabapentin 100 is still radiate into the left thigh but not as severely.  He is recently been seen by [name] for an sinus infection and is feeling better since initiating therapy over the weekend.  REVIEW OF SYSTEMS: Denies fevers, chills, recent weight gain or weight loss.  No night sweats. No significant nighttime awakenings due to this issue. Pt denies any change in bowel or bladder habits, muscle weakness, numbness or falls associated with this pain. He does report that his urinary issues have been improving with continued pelvic floor PT as well as the osteopathic manipulation.  Otherwise per HPI  HISTORY:  Prior history reviewed and updated per electronic medical record.  Patient Active Problem List   Diagnosis Date Noted  . Exertional angina ([place]) 04/18/2018  . Senile purpura ([place]) 10/07/2017  . Hyperglycemia 10/07/2017  . Weakness of left leg 06/21/2017  . DJD (degenerative joint disease)     Back pain gets him down   . Arthritis     "middle finger right hand; right knee; neck" (02/06/2014)   . Neck pain 02/08/2017  . Venous insufficiency  12/29/2016  . Ganglion cyst of tendon sheath of right hand 12/29/2016  . Avulsion fracture of metatarsal bone of right foot 11/25/2016  . Gout 11/20/2016  . Obesity, Class I, BMI 30-34.9 10/03/2015  . Hereditary and idiopathic peripheral neuropathy 09/25/2014  . Allergic rhinitis 05/31/2014    clarinex and flonase. Allergy shots.    . Fatigue 11/09/2013  . CKD (chronic kidney disease), stage III ([place]) 10/23/2013    irbesartan   . Dizziness 08/23/2013    At times especially at night.  Has had BPPV as well before.    . Neuropathy ([place]) 08/21/2013    [name]. When feet gets cold, experiences burning, needles pain. Better on gabapentin.    [name] RLS (restless legs syndrome) 01/03/2013    States no longer suffering with this.    . Chronic bilateral low back pain with left-sided sciatica 10/09/2011    Epidural inj 09/27/17, 09/13/17, 07/19/17  MRI 07/01/2017: 1. 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 2. L4-5 advanced spinal stenosis. Moderate right more than left foraminal narrowing. 3. L5-S1 moderate left and mild right foraminal narrowing. Left subarticular recess narrowing that could affect the left S1 nerve root. 4. Mild levoscoliosis.   [name] History of bladder cancer 04/01/2010    Prostate cancer as well S/p Prostatectomy 2012. Also cautery in bladder. Penile pain since that time.    [name]  OSTEOARTHRITIS, LOWER LEG, LEFT 10/04/2009  . TOBACCO ABUSE, HX OF 12/26/2008    35 pack years. Quit age 68. CT 2016 2.6 x 2.8   . ANEMIA, IRON DEFICIENCY 11/06/2008  . 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   . Essential hypertension 02/21/2007    irbesartan 300mg  and hctz 25mg  <--Losartan-hctz 100-25 mg <--valsartan-hctz 320-25   . OSA (obstructive sleep apnea) 11/02/2006    NPSG 2009:  AHI 8/hr.  NPSG 2015:  AHI 13/hr, primarily during REM.  No PLMS   .  Hyperlipidemia 10/12/2006    Repatha  Simvastatin 20mg  --> trial 40mg . Failed rosuvastatin and atorvastatin with myalgias.    . Depression, major, single episode, in partial remission ([place]) 10/12/2006    Formerly on medication-too tired and sleepy. Lexapro-may consider restarting.    . Coronary atherosclerosis 10/12/2006    ASA, statin. S/p CABG x 5 vessels. Asymptomatic since. Perfusion study 2011 was normal. 02/2014-repeat study low risk   . Asthma 10/12/2006    Follows with Dr. Remus Blake. Controller and albuterol     . GERD 10/12/2006    prilosec 20mg  Trial zantac- worked initially- then failed   . Irritable bowel syndrome 10/12/2006    Constipation Predominant. History of generalized abdominal pain from this. See GI note 02/18/10.      Social History   Occupational History  . Occupation: retired    Fish farm manager: RETIRED  Tobacco Use  . Smoking status: Former Smoker    Packs/day: 1.00    Years: 35.00    Pack years: 35.00    Types: Cigarettes    Last attempt to quit: 06/16/1992    Years since quitting: 25.8  . Smokeless tobacco: Never Used  . Tobacco comment: discussed the AAA but thinks he may have had this   Substance and Sexual Activity  . Alcohol use: Yes    Alcohol/week: 4.0 standard drinks    Types: 4 Shots of liquor per week    Comment: mixed drink  . Drug use: No  . Sexual activity: Never   Social History   Social History Narrative   Married 42 years in 2015. No kids (mumps at age 83)      Retired from [employer] in Engineer, technical sales   Volunteers at [employer]: poker, movies and tv, staying active    OBJECTIVE:  VS:  HT:5\' 6"  (167.6 cm)   WT:211 lb 9.6 oz (96 kg)  BMI:34.17    BP:114/78  HR:85bpm  TEMP: ( )  RESP:92 %   PHYSICAL EXAM: Adult male.  No acute distress.  Alert and appropriate. Good lumbar range of motion although there are functional limitations. Marked improvement in his hip range of motion.  Negative straight leg raises  bilaterally. Upper extremity lower extremity strength is 5/5 in all myotomes. Normal sensation in bilateral lower extremities. Significant anterior chain dominant posture.     ASSESSMENT:   1. Chronic bilateral low back pain with left-sided sciatica   2. Lumbar spondylosis   3. Somatic dysfunction of lumbar region   4. Somatic dysfunction of pelvis region   5. Somatic dysfunction of sacral region   6. Somatic dysfunction of lower extremity   7. Somatic dysfunction of thoracic region     PROCEDURES:  PROCEDURE NOTE: OSTEOPATHIC MANIPULATION   The decision today to treat with Osteopathic Manipulative Therapy (OMT) was based on physical exam findings. Verbal consent was obtained following a discussion with the patient regarding the of risks,  benefits and potential side effects, including an acute pain flare,post manipulation soreness and need for repeat treatments.  NONE  Manipulation was performed as below:  [facility] Treated & Osteopathic Exam Findings   THORACIC SPINE:  L3 FRS left Right on right sacral torsion Left anterior innominate with left hip flexor contracture Left externally rotated hip with plantarflexed left ankle T10 FRS left    OMT Techniques Used: HVLA muscle energy myofascial release HVLA - Long Lever    The patient tolerated the treatment well and reported Improved symptoms following treatment today. Patient was given medications, exercises, stretches and lifestyle modifications per AVS and verbally.       PLAN:  Pertinent additional documentation may be included in corresponding procedure notes, imaging studies, problem based documentation and patient instructions.  No problem-specific Assessment & Plan notes found for this encounter.  He continues to show good improvement.  Continue previously prescribed home exercise program.   Osteopathic manipulation was performed today based on physical exam findings.  Patient has responded well to osteopathic  manipulation previously the prior manipulation did not provide permanent long lasting relief.  The patient does feel as though there was significant benefit to the prior manipulation and they wished for repeat manipulation today.  They understand that home therapeutic exercises are critical part of the healing/treatment process and will continue with self treatment between now and their next visit as outlined.  The patient understands that the frequency of visits is meant to provide a stimulus to promote the body's own ability to heal and is not meant to be the sole means for improvement in their symptoms.  Activity modifications and the importance of avoiding exacerbating activities (limiting pain to no more than a 4 / 10 during or following activity) recommended and discussed.  Discussed red flag symptoms that warrant earlier emergent evaluation and patient voices understanding.  Return in about 3 weeks (around 05/16/2018) for consideration of repeat Osteopathic Manipulation.          Gerda Diss, [facility] Physician

## 2018-04-26 ENCOUNTER — Ambulatory Visit (HOSPITAL_COMMUNITY)
Admission: RE | Admit: 2018-04-26 | Discharge: 2018-04-26 | Disposition: A | Discharge status: Home / Self Care | Payer: PPO | Source: Ambulatory Visit | Attending: Cardiology | Admitting: Cardiology

## 2018-04-26 DIAGNOSIS — I208 Other forms of angina pectoris: Secondary | ICD-10-CM

## 2018-04-26 LAB — EXERCISE TOLERANCE TEST
Estimated workload: 9.5 METS
Exercise duration (min): 7 min
Exercise duration (sec): 38 s
MPHR: 145 {beats}/min
Peak HR: 144 {beats}/min
Percent HR: 99 %
RPE: 17
Rest HR: 71 {beats}/min

## 2018-04-27 DIAGNOSIS — G4733 Obstructive sleep apnea (adult) (pediatric): Secondary | ICD-10-CM | POA: Diagnosis not present

## 2018-04-28 ENCOUNTER — Telehealth: Payer: Self-pay | Admitting: Cardiology

## 2018-04-28 NOTE — Telephone Encounter (Signed)
New Message.        Patient would like a call back concerning his ETT test. He would like his results. Pls call and advise.

## 2018-04-28 NOTE — Telephone Encounter (Signed)
Pt aware of GXT results ./cy

## 2018-04-30 ENCOUNTER — Encounter: Payer: Self-pay | Admitting: Sports Medicine

## 2018-05-02 DIAGNOSIS — J301 Allergic rhinitis due to pollen: Secondary | ICD-10-CM | POA: Diagnosis not present

## 2018-05-02 DIAGNOSIS — J3089 Other allergic rhinitis: Secondary | ICD-10-CM | POA: Diagnosis not present

## 2018-05-02 DIAGNOSIS — J3081 Allergic rhinitis due to animal (cat) (dog) hair and dander: Secondary | ICD-10-CM | POA: Diagnosis not present

## 2018-05-05 ENCOUNTER — Encounter: Payer: Self-pay | Admitting: Family Medicine

## 2018-05-05 ENCOUNTER — Ambulatory Visit (INDEPENDENT_AMBULATORY_CARE_PROVIDER_SITE_OTHER): Payer: PPO | Admitting: Family Medicine

## 2018-05-05 VITALS — BP 118/60 | HR 84 | Temp 97.8°F | Ht 66.0 in | Wt 213.6 lb

## 2018-05-05 DIAGNOSIS — D692 Other nonthrombocytopenic purpura: Secondary | ICD-10-CM

## 2018-05-05 DIAGNOSIS — R739 Hyperglycemia, unspecified: Secondary | ICD-10-CM | POA: Diagnosis not present

## 2018-05-05 DIAGNOSIS — F325 Major depressive disorder, single episode, in full remission: Secondary | ICD-10-CM | POA: Diagnosis not present

## 2018-05-05 DIAGNOSIS — I10 Essential (primary) hypertension: Secondary | ICD-10-CM | POA: Diagnosis not present

## 2018-05-05 DIAGNOSIS — G629 Polyneuropathy, unspecified: Secondary | ICD-10-CM

## 2018-05-05 DIAGNOSIS — I2510 Atherosclerotic heart disease of native coronary artery without angina pectoris: Secondary | ICD-10-CM | POA: Diagnosis not present

## 2018-05-05 DIAGNOSIS — B356 Tinea cruris: Secondary | ICD-10-CM | POA: Diagnosis not present

## 2018-05-05 DIAGNOSIS — I700 Atherosclerosis of aorta: Secondary | ICD-10-CM | POA: Diagnosis not present

## 2018-05-05 LAB — CBC WITH DIFFERENTIAL/PLATELET
Basophils Absolute: 0.1 10*3/uL (ref 0.0–0.1)
Basophils Relative: 0.7 % (ref 0.0–3.0)
Eosinophils Absolute: 0.1 10*3/uL (ref 0.0–0.7)
Eosinophils Relative: 0.9 % (ref 0.0–5.0)
HCT: 44.7 % (ref 39.0–52.0)
Hemoglobin: 15.5 g/dL (ref 13.0–17.0)
Lymphocytes Relative: 12.5 % (ref 12.0–46.0)
Lymphs Abs: 1 10*3/uL (ref 0.7–4.0)
MCHC: 34.7 g/dL (ref 30.0–36.0)
MCV: 91.3 fl (ref 78.0–100.0)
Monocytes Absolute: 0.9 10*3/uL (ref 0.1–1.0)
Monocytes Relative: 10.1 % (ref 3.0–12.0)
Neutro Abs: 6.4 10*3/uL (ref 1.4–7.7)
Neutrophils Relative %: 75.8 % (ref 43.0–77.0)
Platelets: 245 10*3/uL (ref 150.0–400.0)
RBC: 4.9 Mil/uL (ref 4.22–5.81)
RDW: 13.2 % (ref 11.5–15.5)
WBC: 8.4 10*3/uL (ref 4.0–10.5)

## 2018-05-05 LAB — COMPREHENSIVE METABOLIC PANEL
ALT: 26 U/L (ref 0–53)
AST: 21 U/L (ref 0–37)
Albumin: 4.1 g/dL (ref 3.5–5.2)
Alkaline Phosphatase: 50 U/L (ref 39–117)
BUN: 17 mg/dL (ref 6–23)
CO2: 31 mEq/L (ref 19–32)
Calcium: 9.4 mg/dL (ref 8.4–10.5)
Chloride: 95 mEq/L — ABNORMAL LOW (ref 96–112)
Creatinine, Ser: 1.15 mg/dL (ref 0.40–1.50)
GFR: 61.96 mL/min (ref >60.00)
Glucose, Bld: 77 mg/dL (ref 70–99)
Potassium: 3.8 mEq/L (ref 3.5–5.1)
Sodium: 134 mEq/L — ABNORMAL LOW (ref 135–145)
Total Bilirubin: 0.9 mg/dL (ref 0.2–1.2)
Total Protein: 6.5 g/dL (ref 6.0–8.3)

## 2018-05-05 LAB — HEMOGLOBIN A1C: Hgb A1c MFr Bld: 5.7 % (ref 4.6–6.5)

## 2018-05-05 MED ORDER — TERBINAFINE HCL 250 MG PO TABS: 250.0000 mg | ORAL_TABLET | Freq: Every day | ORAL | 0 refills | Status: DC

## 2018-05-05 NOTE — Patient Instructions (Addendum)
Please stop by lab before you go If you do not have mychart- we will call you about results within 5 business days of [place] receiving them.  If you have mychart- we will send your results within 3 business days of [place] receiving them.  If abnormal or we want to clarify a result, we will call or mychart you to make sure you receive the message.  If you have questions or concerns or don't hear within 5-7 days, please send [place] a message or call us.   Glad you are feeling so well!  Certainly hope this continues for you  If you would like-you can go ahead and schedule a physical 1 year out from your last physical

## 2018-05-05 NOTE — Progress Notes (Signed)
Phone [phone]   Subjective:  Joshua [name] is a 78 y.o. year old very pleasant male patient who presents for/with See problem oriented charting ROS-chest pain has resolved.  No edema.  Itching and burning in groin noted.  Past Medical History-  Patient Active Problem List   Diagnosis Date Noted  . Coronary atherosclerosis 10/12/2006    Priority: High  . Irritable bowel syndrome 10/12/2006    Priority: High  . Hyperglycemia 10/07/2017    Priority: Medium  . Gout 11/20/2016    Priority: Medium  . CKD (chronic kidney disease), stage III ([place]) 10/23/2013    Priority: Medium  . Neuropathy ([place]) 08/21/2013    Priority: Medium  . History of bladder cancer 04/01/2010    Priority: Medium  . Essential hypertension 02/21/2007    Priority: Medium  . Hyperlipidemia 10/12/2006    Priority: Medium  . Depression, major, single episode, in partial remission ([place]) 10/12/2006    Priority: Medium  . Asthma 10/12/2006    Priority: Medium  . Senile purpura ([place]) 10/07/2017    Priority: Low  . DJD (degenerative joint disease)     Priority: Low  . Arthritis     Priority: Low  . Neck pain 02/08/2017    Priority: Low  . Venous insufficiency 12/29/2016    Priority: Low  . Ganglion cyst of tendon sheath of right hand 12/29/2016    Priority: Low  . Avulsion fracture of metatarsal bone of right foot 11/25/2016    Priority: Low  . Obesity, Class I, BMI 30-34.9 10/03/2015    Priority: Low  . Allergic rhinitis 05/31/2014    Priority: Low  . Fatigue 11/09/2013    Priority: Low  . Dizziness 08/23/2013    Priority: Low  . RLS (restless legs syndrome) 01/03/2013    Priority: Low  . Chronic bilateral low back pain with left-sided sciatica 10/09/2011    Priority: Low  . OSTEOARTHRITIS, LOWER LEG, LEFT 10/04/2009    Priority: Low  . TOBACCO ABUSE, HX OF 12/26/2008    Priority: Low  . ANEMIA, IRON DEFICIENCY 11/06/2008    Priority: Low  . 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 OSA (obstructive sleep apnea) 11/02/2006    Priority: Low  . GERD 10/12/2006    Priority: Low  . Aortic atherosclerosis ([place]) 05/05/2018  . Exertional angina ([place]) 04/18/2018  . Weakness of left leg 06/21/2017  . Hereditary and idiopathic peripheral neuropathy 09/25/2014    Medications- reviewed and updated Current Outpatient Medications  Medication Sig Dispense Refill  . acetaminophen (TYLENOL) 500 MG tablet Take 1,000 mg by mouth every 6 (six) hours as needed for headache (pain). Reported on 07/02/2015    . albuterol (PROAIR HFA) 108 (90 BASE) MCG/ACT inhaler Inhale 1-2 puffs into the lungs every 6 (six) hours as needed for wheezing or shortness of breath. 18 g 3  . aspirin EC 81 MG tablet Take 81 mg by mouth daily.    . clotrimazole-betamethasone (LOTRISONE) cream USE 0.5 CM TRRANSDERMALLY TWICE A DAY  6  . Cyanocobalamin (VITAMIN B 12 PO) Take 800 mg by mouth daily.    [name] desloratadine (CLARINEX) 5 MG tablet Take 5 mg by mouth at bedtime.     . diazepam (VALIUM) 5 MG tablet Take 1 tablet (5 mg total) by mouth at bedtime as needed. 30 tablet 1  . Evolocumab (REPATHA SURECLICK) 989 MG/ML SOAJ Inject 140 mg into the skin every 14 (fourteen) days. 2 pen 11  . FLOVENT HFA  110 MCG/ACT inhaler Inhale 2 puffs into the lungs 2 (two) times daily. Reported on 03/22/2015  5  . fluticasone (FLONASE) 50 MCG/ACT nasal spray Place 1 spray into both nostrils 2 (two) times daily.     . folic acid (FOLVITE) 381 MCG tablet Take 800 mcg by mouth daily. Patient is taking 1 tablet with breakfast  and 1 tablet at dinner    . gabapentin (NEURONTIN) 100 MG capsule Take 1 cap three times a day, 5 caps at bedtime 720 capsule 3  . hydrochlorothiazide (HYDRODIURIL) 25 MG tablet TAKE 1 TABLET BY MOUTH EVERY DAY 90 tablet 3  . ketoconazole (NIZORAL) 2 % cream Apply topically 2 (two) times daily.    [name] losartan (COZAAR) 100 MG tablet Take 1 tablet (100 mg total) by mouth daily. 90 tablet 3  . NON  FORMULARY     . nystatin (MYCOSTATIN/NYSTOP) powder Apply topically 4 (four) times daily. 15 g 0  . omeprazole (PRILOSEC) 20 MG capsule TAKE 1 CAPSULE BY MOUTH EVERY DAY 90 capsule 1  . ondansetron (ZOFRAN ODT) 8 MG disintegrating tablet Take 1 tablet (8 mg total) by mouth every 8 (eight) hours as needed for nausea or vomiting. 20 tablet 0  . polyethylene glycol powder (GLYCOLAX/MIRALAX) powder Take 17 g by mouth daily. 255 g 0  . Probiotic Product (ALIGN) 4 MG CAPS Take 4 mg by mouth daily.     . simvastatin (ZOCOR) 20 MG tablet TAKE 1 TABLET BY MOUTH EVERYDAY AT BEDTIME 90 tablet 0  . terbinafine (LAMISIL) 250 MG tablet Take 1 tablet (250 mg total) by mouth daily. 14 tablet 0   No current facility-administered medications for this visit.      Objective:  BP 118/60 (BP Location: Left Arm)   Pulse 84   Temp 97.8 F (36.6 C) (Oral)   Ht 5\' 6"  (1.676 m)   Wt 213 lb 9.6 oz (96.9 kg)   SpO2 96%   BMI 34.48 kg/m  Gen: NAD, resting comfortably CV: RRR no murmurs rubs or gallops Lungs: CTAB no crackles, wheeze, rhonchi Abdomen: soft/nontender/nondistended/normal bowel sounds.  Ext: no edema Skin: warm, dry- erythema noted in groin-some dried powder noted    Assessment and Plan   #Jock itch S: Issues for months and months.  Has seen dermatology.  Has tried 2 different combo creams for steroid and antifungal.  Continues to get some burning sensation. A/P: We will trial terbinafine for 2 weeks due to poor control- discussed referral back to dermatology if not doing better.  Might also need try a thick emollient like Vaseline  #CAD/hyperlipidemia-history of CABG x5- follows with Dr. Percival Spanish S: Compliant with Repatha and simvastatin for lipids.  He is compliant with aspirin as well.  As potential cause of chest pain- thinks he had costochondritis recently- tender to touch on chest.  -Most recent stress test April 26, 2018-no evidence of ischemia A/P:  Stable problems x2. Continue  current medications.      #Hypertension/CKD stage III S: Compliant with hydrochlorothiazide 25 mg and losartan 100 mg -GFR has been stable.  On ARB in case proteinuric element A/P:  Stable. Continue current medications.      #Depression, major, single episode- in full remission! S: Patient has not tolerated medication in the past-feels too tired and sleepy on SSRIs -Has declined counseling and continues to decline counseling-has done this in the past and only somewhat helpful. - Fortunately today patient feels excellent-PHQ 9 score of 0-that is the best we have ever  seen - even when sick recently wasn't depressed  -has had excellent last few days- no fatigue- had one good night of rest - doing more around the house -Was able to help an older gentleman recently and that meant a lot to him A/P: Patient doing well- hopefully patient can continue on this path.  He really finds joy from investing and others-has a good heart-encouraged him to continue to pursue these opportunities  % Neuropathy-follows with [name] S: Compliant with gabapentin.  Burning/needle pain in the feet  A/P:  Stable. Continue current medications.  Continue follow-up with neurology   #Hyperglycemia history of mildly elevated A1c.  Prediabetes range.   #Senile purpura-easy bruising and bleeding on aspirin.  Stable #Aortic atherosclerosis-Noted on CT 2016.  Likely stable.  Continue risk factor modification  Future Appointments  Date Time Provider [place]  05/16/2018  9:00 AM Gerda Diss, DO [facility]  08/18/2018  9:00 AM [name], MD [facility]  09/12/2018  8:30 AM Cameron Sprang, MD [facility] None   Return in about 6 months (around 11/03/2018) for physical.  Lab/Order associations: Jock itch  Atherosclerosis of native coronary artery of native heart without angina pectoris  Essential hypertension - Plan: CBC with Differential/Platelet, Comprehensive metabolic panel  Major  depressive disorder with single episode, in full remission ([place]), Chronic  Hyperglycemia - Plan: Hemoglobin A1c  Senile purpura ([place]), Chronic  Aortic atherosclerosis ([facility])  Neuropathy ([place])  Meds ordered this encounter  Medications  . terbinafine (LAMISIL) 250 MG tablet    Sig: Take 1 tablet (250 mg total) by mouth daily.    Dispense:  14 tablet    Refill:  0    Return precautions advised.  [name], MD

## 2018-05-05 NOTE — Assessment & Plan Note (Deleted)
Noted on CT 2016.  Likely stable.  Continue risk factor modification

## 2018-05-06 ENCOUNTER — Telehealth: Payer: Self-pay | Admitting: Family Medicine

## 2018-05-06 NOTE — Telephone Encounter (Signed)
Copied from [place] [phone]. Topic: Quick Communication - See Telephone Encounter >> May 06, 2018  5:41 PM Blase Mess A wrote: CRM for notification. See Telephone encounter for: 05/06/18.Patient is calling back for his lab results. Please advise. Thank you.

## 2018-05-09 DIAGNOSIS — J3081 Allergic rhinitis due to animal (cat) (dog) hair and dander: Secondary | ICD-10-CM | POA: Diagnosis not present

## 2018-05-09 DIAGNOSIS — J3089 Other allergic rhinitis: Secondary | ICD-10-CM | POA: Diagnosis not present

## 2018-05-09 DIAGNOSIS — J301 Allergic rhinitis due to pollen: Secondary | ICD-10-CM | POA: Diagnosis not present

## 2018-05-09 NOTE — Telephone Encounter (Signed)
See note

## 2018-05-09 NOTE — Telephone Encounter (Signed)
Please see lab results for message.

## 2018-05-11 ENCOUNTER — Encounter: Payer: Self-pay | Admitting: Family Medicine

## 2018-05-16 ENCOUNTER — Telehealth: Payer: Self-pay | Admitting: Family Medicine

## 2018-05-16 ENCOUNTER — Encounter: Payer: Self-pay | Admitting: Sports Medicine

## 2018-05-16 ENCOUNTER — Ambulatory Visit (INDEPENDENT_AMBULATORY_CARE_PROVIDER_SITE_OTHER): Payer: PPO | Admitting: Sports Medicine

## 2018-05-16 VITALS — BP 116/80 | HR 78 | Ht 66.0 in | Wt 210.8 lb

## 2018-05-16 DIAGNOSIS — M9904 Segmental and somatic dysfunction of sacral region: Secondary | ICD-10-CM | POA: Diagnosis not present

## 2018-05-16 DIAGNOSIS — M9905 Segmental and somatic dysfunction of pelvic region: Secondary | ICD-10-CM | POA: Diagnosis not present

## 2018-05-16 DIAGNOSIS — J3081 Allergic rhinitis due to animal (cat) (dog) hair and dander: Secondary | ICD-10-CM | POA: Diagnosis not present

## 2018-05-16 DIAGNOSIS — R29898 Other symptoms and signs involving the musculoskeletal system: Secondary | ICD-10-CM | POA: Diagnosis not present

## 2018-05-16 DIAGNOSIS — M24559 Contracture, unspecified hip: Secondary | ICD-10-CM

## 2018-05-16 DIAGNOSIS — J301 Allergic rhinitis due to pollen: Secondary | ICD-10-CM | POA: Diagnosis not present

## 2018-05-16 DIAGNOSIS — M9903 Segmental and somatic dysfunction of lumbar region: Secondary | ICD-10-CM | POA: Diagnosis not present

## 2018-05-16 DIAGNOSIS — J3089 Other allergic rhinitis: Secondary | ICD-10-CM | POA: Diagnosis not present

## 2018-05-16 NOTE — Progress Notes (Signed)
Joshua [name]. Joshua [name], [place] [facility] [place]  DASCHEL ROUGHTON - 78 y.o. male MRN [id number]  Date of birth: 05-30-1940  Visit Date: May 16, 2018  PCP: [name], MD   Referred by: [name], MD  SUBJECTIVE:  Chief Complaint  Patient presents with  . Lower Back - Follow-up    XR L-spine 05/27/17. MRI L-spine 07/01/17. Epidural inj x 3, last done 09/27/17. Tylenol prn, Gabapentin 100 mg. HEP provided.     HPI: Patient is here for follow-up of his low back pain.  Unfortunately is actually had fairly significant worsening over the past 2 days.  He had been doing well and had spaced out to further time between visits.  He has been performing his home therapeutic exercises.  The most relief that he has been getting is from osteopathic manipulation.  This is been superior to the medications and epidural steroid injections.  He has continue with his pelvic floor exercises as well as for his underlying prostate cancer related pain.  REVIEW OF SYSTEMS: No significant nighttime awakenings due to this issue. Denies fevers, chills, recent weight gain or weight loss.  No night sweats.   HISTORY:  Prior history reviewed and updated per electronic medical record.  Patient Active Problem List   Diagnosis Date Noted  . Aortic atherosclerosis ([place]) 05/05/2018  . Exertional angina ([place]) 04/18/2018  . Senile purpura ([place]) 10/07/2017  . Hyperglycemia 10/07/2017  . Weakness of left leg 06/21/2017  . DJD (degenerative joint disease)     Back pain gets him down   . Arthritis     "middle finger right hand; right knee; neck" (02/06/2014)   . Neck pain 02/08/2017  . Venous insufficiency 12/29/2016  . Ganglion cyst of tendon sheath of right hand 12/29/2016  . Avulsion fracture of metatarsal bone of right foot 11/25/2016  . Gout 11/20/2016  . Obesity, Class I, BMI 30-34.9 10/03/2015  . Hereditary and idiopathic peripheral  neuropathy 09/25/2014  . Allergic rhinitis 05/31/2014    clarinex and flonase. Allergy shots.    . Fatigue 11/09/2013  . CKD (chronic kidney disease), stage III ([place]) 10/23/2013    irbesartan   . Dizziness 08/23/2013    At times especially at night.  Has had BPPV as well before.    . Neuropathy ([place]) 08/21/2013    [name]. When feet gets cold, experiences burning, needles pain. Better on gabapentin.    [name] RLS (restless legs syndrome) 01/03/2013    States no longer suffering with this.    . Chronic bilateral low back pain with left-sided sciatica 10/09/2011    Epidural inj 09/27/17, 09/13/17, 07/19/17  MRI 07/01/2017: 1. 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 2. L4-5 advanced spinal stenosis. Moderate right more than left foraminal narrowing. 3. L5-S1 moderate left and mild right foraminal narrowing. Left subarticular recess narrowing that could affect the left S1 nerve root. 4. Mild levoscoliosis.   [name] History of bladder cancer 04/01/2010    Prostate cancer as well S/p Prostatectomy 2012. Also cautery in bladder. Penile pain since that time.    . OSTEOARTHRITIS, LOWER LEG, LEFT 10/04/2009  . TOBACCO ABUSE, HX OF 12/26/2008    35 pack years. Quit age 48. CT 2016 2.6 x 2.8   . ANEMIA, IRON DEFICIENCY 11/06/2008  . 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   . Essential hypertension 02/21/2007    irbesartan 300mg  and hctz 25mg  <--Losartan-hctz 100-25 mg <--valsartan-hctz 320-25   . OSA (obstructive sleep apnea) 11/02/2006    NPSG 2009:  AHI 8/hr.  NPSG 2015:  AHI 13/hr, primarily during REM.  No PLMS   . Hyperlipidemia 10/12/2006    Repatha  Simvastatin 20mg  --> trial 40mg . Failed rosuvastatin and atorvastatin with myalgias.    . Depression, major, single episode, in partial remission ([place]) 10/12/2006    Formerly on medication-too tired and  sleepy. Lexapro-may consider restarting.    . Coronary atherosclerosis 10/12/2006    ASA, statin. S/p CABG x 5 vessels. Asymptomatic since. Perfusion study 2011 was normal. 02/2014-repeat study low risk   . Asthma 10/12/2006    Follows with Dr. Remus Blake. Controller and albuterol     . GERD 10/12/2006    prilosec 20mg  Trial zantac- worked initially- then failed   . Irritable bowel syndrome 10/12/2006    Constipation Predominant. History of generalized abdominal pain from this. See GI note 02/18/10.      Social History   Occupational History  . Occupation: retired    Fish farm manager: RETIRED  Tobacco Use  . Smoking status: Former Smoker    Packs/day: 1.00    Years: 35.00    Pack years: 35.00    Types: Cigarettes    Last attempt to quit: 06/16/1992    Years since quitting: 25.9  . Smokeless tobacco: Never Used  . Tobacco comment: discussed the AAA but thinks he may have had this   Substance and Sexual Activity  . Alcohol use: Yes    Alcohol/week: 4.0 standard drinks    Types: 4 Shots of liquor per week    Comment: mixed drink  . Drug use: No  . Sexual activity: Never   Social History   Social History Narrative   Married 42 years in 2015. No kids (mumps at age 53)      Retired from [employer] in Engineer, technical sales   Volunteers at [place]: poker, movies and tv, staying active    OBJECTIVE:  VS:  HT:5\' 6"  (167.6 cm)   WT:210 lb 12.8 oz (95.6 kg)  BMI:34.04    BP:116/80  HR:78bpm  TEMP: ( )  RESP:96 %   PHYSICAL EXAM: Adult male. No acute distress.  Alert and appropriate. Bilateral lower extremities overall well aligned without significant deformity.  His left hip has overall good range of motion but diminished compared to the right.  He does have a hip flexed position of ease bilaterally.  Negative logroll.  Negative Stinchfield test.   ASSESSMENT:   1. Weakness of left leg   2. Hip flexor tightness, unspecified laterality   3. Somatic dysfunction of lumbar region     4. Somatic dysfunction of pelvis region   5. Somatic dysfunction of sacral region     PROCEDURES:  PROCEDURE NOTE: OSTEOPATHIC MANIPULATION  The decision today to treat with Osteopathic Manipulative Therapy (OMT) was based on physical exam findings. Verbal consent was obtained following a discussion with the patient regarding the of risks, benefits and potential side effects, including an acute pain flare,post manipulation soreness and need for repeat treatments.   Contraindications to OMT: NONE Manipulation was performed as below: [facility] Treated & Osteopathic Exam Findings LUMBAR SPINE:  L3 FRS LEFT (Flexed, Rotated & Sidebent) PELVIS:  Left psoas spasm Left anterior innonimate SACRUM:  R on R sacral torsion  OMT Techniques Used: HVLA muscle energy myofascial  release  The patient tolerated the treatment well and reported Improved symptoms following treatment today. Patient was given medications, exercises, stretches and lifestyle modifications per AVS and verbally.      PLAN:  Pertinent additional documentation may be included in corresponding procedure notes, imaging studies, problem based documentation and patient instructions.  No problem-specific Assessment & Plan notes found for this encounter.   He was he had good response osteopathic manipulation.  Unfortunately spacing his visits out has led to an acute flareup that was fairly severe for him.  Continue previously prescribed home exercise program.   Osteopathic manipulation was performed today based on physical exam findings.  Patient has responded well to osteopathic manipulation previously the prior manipulation did not provide permanent long lasting relief.  The patient does feel as though there was significant benefit to the prior manipulation and they wished for repeat manipulation today.  They understand that home therapeutic exercises are critical part of the healing/treatment process and will continue with self  treatment between now and their next visit as outlined.  The patient understands that the frequency of visits is meant to provide a stimulus to promote the body's own ability to heal and is not meant to be the sole means for improvement in their symptoms.  Activity modifications and the importance of avoiding exacerbating activities (limiting pain to no more than a 4 / 10 during or following activity) recommended and discussed.  Discussed red flag symptoms that warrant earlier emergent evaluation and patient voices understanding.   No orders of the defined types were placed in this encounter.  Lab Orders  No laboratory test(s) ordered today   Imaging Orders  No imaging studies ordered today   Referral Orders  No referral(s) requested today    At follow up will plan to consider: repeat osteopathic manipulation  Return in about 2 weeks (around 05/30/2018).          Gerda Diss, [place] [facility] Physician

## 2018-05-16 NOTE — Telephone Encounter (Signed)
Dr. Paulla Fore- whats your thought on ongoing rx. I do not believe I have rxed this for patient in past so wanted your input/potentially for you to refill

## 2018-05-16 NOTE — Telephone Encounter (Signed)
MEDICATION: diazepam (VALIUM) 5 MG tablet  PHARMACY:   [id number] [place] [address], [place] - [place] [phone] (Phone) [phone] (Fax)     IS THIS A 90 DAY SUPPLY : NO  IS PATIENT OUT OF MEDICATION: No  IF NOT; HOW MUCH IS LEFT: 4 or 5   LAST APPOINTMENT DATE: @2 /14/2020  NEXT APPOINTMENT DATE:@2 /24/2020  OTHER COMMENTS:    **Let patient know to contact pharmacy at the end of the day to make sure medication is ready. **  ** Please notify patient to allow 48-72 hours to process**  **Encourage patient to contact the pharmacy for refills or they can request refills through [facility]**

## 2018-05-16 NOTE — Telephone Encounter (Signed)
Okay for refill? This Rx was filled by Dr. Paulla Fore Should this be forwarded to him?   Please advise

## 2018-05-16 NOTE — Telephone Encounter (Signed)
Sorry- see below Dr. Paulla Fore

## 2018-05-17 MED ORDER — DIAZEPAM 5 MG PO TABS: 5.0000 mg | ORAL_TABLET | Freq: Every evening | ORAL | 1 refills | Status: DC | PRN

## 2018-05-17 NOTE — Telephone Encounter (Signed)
Noted. FYI 

## 2018-05-17 NOTE — Addendum Note (Signed)
Addended by: Teresa Coombs D on: 05/17/2018 10:44 AM   Modules accepted: Orders

## 2018-05-17 NOTE — Telephone Encounter (Signed)
I am happy to refill this for him going forward. Only intermittent use, it looks like 1 fill in the past 12 months

## 2018-05-18 ENCOUNTER — Other Ambulatory Visit: Payer: Self-pay | Admitting: Family Medicine

## 2018-05-20 ENCOUNTER — Other Ambulatory Visit: Payer: Self-pay | Admitting: Cardiology

## 2018-05-22 ENCOUNTER — Encounter: Payer: Self-pay | Admitting: Family Medicine

## 2018-05-23 DIAGNOSIS — J3081 Allergic rhinitis due to animal (cat) (dog) hair and dander: Secondary | ICD-10-CM | POA: Diagnosis not present

## 2018-05-23 DIAGNOSIS — J301 Allergic rhinitis due to pollen: Secondary | ICD-10-CM | POA: Diagnosis not present

## 2018-05-23 DIAGNOSIS — J3089 Other allergic rhinitis: Secondary | ICD-10-CM | POA: Diagnosis not present

## 2018-05-23 NOTE — Telephone Encounter (Signed)
Please see message. °

## 2018-05-27 DIAGNOSIS — G4733 Obstructive sleep apnea (adult) (pediatric): Secondary | ICD-10-CM | POA: Diagnosis not present

## 2018-05-30 ENCOUNTER — Ambulatory Visit: Payer: PPO | Admitting: Sports Medicine

## 2018-05-30 DIAGNOSIS — L309 Dermatitis, unspecified: Secondary | ICD-10-CM | POA: Diagnosis not present

## 2018-05-30 DIAGNOSIS — L304 Erythema intertrigo: Secondary | ICD-10-CM | POA: Diagnosis not present

## 2018-05-31 ENCOUNTER — Encounter: Payer: Self-pay | Admitting: Sports Medicine

## 2018-05-31 ENCOUNTER — Ambulatory Visit (INDEPENDENT_AMBULATORY_CARE_PROVIDER_SITE_OTHER): Payer: PPO | Admitting: Sports Medicine

## 2018-05-31 VITALS — BP 120/76 | HR 85 | Ht 66.0 in | Wt 211.4 lb

## 2018-05-31 DIAGNOSIS — M9904 Segmental and somatic dysfunction of sacral region: Secondary | ICD-10-CM

## 2018-05-31 DIAGNOSIS — M24559 Contracture, unspecified hip: Secondary | ICD-10-CM | POA: Diagnosis not present

## 2018-05-31 DIAGNOSIS — M9903 Segmental and somatic dysfunction of lumbar region: Secondary | ICD-10-CM | POA: Diagnosis not present

## 2018-05-31 DIAGNOSIS — R29898 Other symptoms and signs involving the musculoskeletal system: Secondary | ICD-10-CM

## 2018-05-31 DIAGNOSIS — B356 Tinea cruris: Secondary | ICD-10-CM

## 2018-05-31 DIAGNOSIS — M9905 Segmental and somatic dysfunction of pelvic region: Secondary | ICD-10-CM | POA: Diagnosis not present

## 2018-05-31 NOTE — Progress Notes (Signed)
Joshua [name]. , [place] [facility] [place]  Joshua [name] - 78 y.o. male MRN [id number]  Date of birth: [birth year]  Visit Date: June 03, 2018  PCP: Joshua Olp, MD   Referred by: Joshua Olp, MD  SUBJECTIVE:  Chief Complaint  Patient presents with  . Lower Back - Follow-up    MRI L-spine 07/01/17. XR L-spine 05/27/17. Epidural inj x 3, last done 09/27/17. Has tried Gabapentin 100 mg and Tylenol. Provided with HEP.   [name] Left Leg - Follow-up    Weakness in L leg.     HPI: Patient is here for follow-up of his left leg hip and back pain.  Visual he has been doing well up until approximately 3 days ago he had worsening of his left lower back.  He does report this is likely secondary to a skin lesion within his groin has not been treated appropriately by dermatology.  He is getting some radiation into the left leg and this is mild.  He is continuing to perform his home therapeutic exercises on a regular basis.  REVIEW OF SYSTEMS: No significant nighttime awakenings due to this issue. Denies fevers, chills, recent weight gain or weight loss.  No night sweats.  Chronic bladder changes unchanged from prior visits.  HISTORY:  Prior history reviewed and updated per electronic medical record.  Patient Active Problem List   Diagnosis Date Noted  . Aortic atherosclerosis ([place]) 05/05/2018  . Exertional angina ([place]) 04/18/2018  . Senile purpura ([place]) 10/07/2017  . Hyperglycemia 10/07/2017  . Weakness of left leg 06/21/2017  . DJD (degenerative joint disease)     Back pain gets him down   . Arthritis     "middle finger right hand; right knee; neck" (02/06/2014)   . Neck pain 02/08/2017  . Venous insufficiency 12/29/2016  . Ganglion cyst of tendon sheath of right hand 12/29/2016  . Avulsion fracture of metatarsal bone of right foot 11/25/2016  . Gout 11/20/2016  . Obesity, Class I, BMI 30-34.9 10/03/2015  . Hereditary  and idiopathic peripheral neuropathy 09/25/2014  . Allergic rhinitis 05/31/2014    clarinex and flonase. Allergy shots.    . Fatigue 11/09/2013  . CKD (chronic kidney disease), stage III ([place]) 10/23/2013    irbesartan   . Dizziness 08/23/2013    At times especially at night.  Has had BPPV as well before.    . Neuropathy ([place]) 08/21/2013    Joshua [name]. When feet gets cold, experiences burning, needles pain. Better on gabapentin.    [name] RLS (restless legs syndrome) 01/03/2013    States no longer suffering with this.    . Chronic bilateral low back pain with left-sided sciatica 10/09/2011    Epidural inj 09/27/17, 09/13/17, 07/19/17  MRI 07/01/2017: 1. 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 2. L4-5 advanced spinal stenosis. Moderate right more than left foraminal narrowing. 3. L5-S1 moderate left and mild right foraminal narrowing. Left subarticular recess narrowing that could affect the left S1 nerve root. 4. Mild levoscoliosis.   [name] History of bladder cancer 04/01/2010    Prostate cancer as well S/p Prostatectomy 2012. Also cautery in bladder. Penile pain since that time.    . OSTEOARTHRITIS, LOWER LEG, LEFT 10/04/2009  . TOBACCO ABUSE, HX OF 12/26/2008    35 pack years. Quit age 64. CT 2016 2.6 x 2.8   .  ANEMIA, IRON DEFICIENCY 11/06/2008  . 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   . Essential hypertension 02/21/2007    irbesartan 300mg  and hctz 25mg  <--Losartan-hctz 100-25 mg <--valsartan-hctz 320-25   . OSA (obstructive sleep apnea) 11/02/2006    NPSG 2009:  AHI 8/hr.  NPSG 2015:  AHI 13/hr, primarily during REM.  No PLMS   . Hyperlipidemia 10/12/2006    Repatha  Simvastatin 20mg  --> trial 40mg . Failed rosuvastatin and atorvastatin with myalgias.    . Depression, major, single episode, in partial remission ([place]) 10/12/2006    Formerly on  medication-too tired and sleepy. Lexapro-may consider restarting.    . Coronary atherosclerosis 10/12/2006    ASA, statin. S/p CABG x 5 vessels. Asymptomatic since. Perfusion study 2011 was normal. 02/2014-repeat study low risk   . Asthma 10/12/2006    Follows with Dr. Remus Blake. Controller and albuterol     . GERD 10/12/2006    prilosec 20mg  Trial zantac- worked initially- then failed   . Irritable bowel syndrome 10/12/2006    Constipation Predominant. History of generalized abdominal pain from this. See GI note 02/18/10.      Social History   Occupational History  . Occupation: retired    Fish farm manager: RETIRED  Tobacco Use  . Smoking status: Former Smoker    Packs/day: 1.00    Years: 35.00    Pack years: 35.00    Types: Cigarettes    Last attempt to quit: 06/16/1992    Years since quitting: 25.9  . Smokeless tobacco: Never Used  . Tobacco comment: discussed the AAA but thinks he may have had this   Substance and Sexual Activity  . Alcohol use: Yes    Alcohol/week: 4.0 standard drinks    Types: 4 Shots of liquor per week    Comment: mixed drink  . Drug use: No  . Sexual activity: Never   Social History   Social History Narrative   Married 42 years in 2015. No kids (mumps at age 77)      Retired from [employer] in Engineer, technical sales   Volunteers at [employer]: poker, movies and tv, staying active    OBJECTIVE:  VS:  HT:5\' 6"  (167.6 cm)   WT:211 lb 6.4 oz (95.9 kg)  BMI:34.14    BP:120/76  HR:85bpm  TEMP: ( )  RESP:97 %   PHYSICAL EXAM: Adult male. No acute distress.  Alert and appropriate. Bilateral lower extremities are overall well aligned without significant edema.  He has negative straight leg raise bilaterally.  His internal and external rotation are symmetric and normal.  He does have some tightness especially with left-sided hip extension.  He has left hip flexor contracture.   ASSESSMENT:   1. Weakness of left leg   2. Hip flexor tightness, unspecified  laterality   3. Somatic dysfunction of lumbar region   4. Somatic dysfunction of pelvis region   5. Somatic dysfunction of sacral region   6. Jock itch     PROCEDURES:  PROCEDURE NOTE: OSTEOPATHIC MANIPULATION   The decision today to treat with Osteopathic Manipulative Therapy (OMT) was based on physical exam findings. Verbal consent was obtained following a discussion with the patient regarding the of risks, benefits and potential side effects, including an acute pain flare,post manipulation soreness and need for repeat treatments.  NONE  Manipulation was performed as below:  [facility] Treated & Osteopathic Exam Findings   LUMBAR SPINE:   L4 FRS LEFT (Flexed, Rotated &  Sidebent) PELVIS:   Left psoas spasm Left anterior innonimate SACRUM:   R on R sacral torsion    OMT Techniques Used:   HVLA muscle energy myofascial release    The patient tolerated the treatment well and reported Improved symptoms following treatment today. Patient was given medications, exercises, stretches and lifestyle modifications per AVS and verbally.   Dry needling to the gluteal minimus tensor fascia lata formed.  Twitch response flex was present.  Performed on sterile fashion  PLAN:  Pertinent additional documentation may be included in corresponding procedure notes, imaging studies, problem based documentation and patient instructions.  No problem-specific Assessment & Plan notes found for this encounter.  He has continued to have persistent left anterior hip flexor spasm and dry needling this region was performed today.  Is actually did have release fairly well and no immediate complications appreciated.  Continue previously prescribed home exercise program.   Osteopathic manipulation was performed today based on physical exam findings.  Patient has responded well to osteopathic manipulation previously the prior manipulation did not provide permanent long lasting relief.  The patient does feel as  though there was significant benefit to the prior manipulation and they wished for repeat manipulation today.  They understand that home therapeutic exercises are critical part of the healing/treatment process and will continue with self treatment between now and their next visit as outlined.  The patient understands that the frequency of visits is meant to provide a stimulus to promote the body's own ability to heal and is not meant to be the sole means for improvement in their symptoms.  Activity modifications and the importance of avoiding exacerbating activities (limiting pain to no more than a 4 / 10 during or following activity) recommended and discussed.   Discussed red flag symptoms that warrant earlier emergent evaluation and patient voices understanding.    No orders of the defined types were placed in this encounter.  Lab Orders  No laboratory test(s) ordered today   Imaging Orders  No imaging studies ordered today   Referral Orders  No referral(s) requested today    At follow up will plan to consider: repeat osteopathic manipulation  No follow-ups on file.          Gerda Diss, [facility] Physician

## 2018-06-03 ENCOUNTER — Encounter: Payer: Self-pay | Admitting: Sports Medicine

## 2018-06-06 DIAGNOSIS — J3081 Allergic rhinitis due to animal (cat) (dog) hair and dander: Secondary | ICD-10-CM | POA: Diagnosis not present

## 2018-06-06 DIAGNOSIS — J3089 Other allergic rhinitis: Secondary | ICD-10-CM | POA: Diagnosis not present

## 2018-06-06 DIAGNOSIS — J301 Allergic rhinitis due to pollen: Secondary | ICD-10-CM | POA: Diagnosis not present

## 2018-06-07 ENCOUNTER — Encounter: Payer: Self-pay | Admitting: Family Medicine

## 2018-06-13 ENCOUNTER — Ambulatory Visit: Payer: PPO | Admitting: Sports Medicine

## 2018-06-15 ENCOUNTER — Encounter: Payer: Self-pay | Admitting: Family Medicine

## 2018-06-15 ENCOUNTER — Telehealth: Payer: Self-pay | Admitting: Family Medicine

## 2018-06-15 NOTE — Telephone Encounter (Signed)
Called and spoke with pt, scheduled Webex with Dr. Paulla Fore tomorrow.

## 2018-06-15 NOTE — Telephone Encounter (Signed)
Copied from [place] [id number]. Topic: Quick Communication - See Telephone Encounter >> Jun 15, 2018  1:33 PM Rayann Heman wrote: CRM for notification. See Telephone encounter for: 06/15/18. Pt left a voicemail that states that he was moving a heavy piece of furniture and felt a pain between his groin and belly button. Pt states that he went out this morning and could not walk very far. Pt would like to know if he needs to come in for an appointment. Please advise.

## 2018-06-16 ENCOUNTER — Ambulatory Visit: Payer: PPO | Admitting: Sports Medicine

## 2018-06-16 ENCOUNTER — Ambulatory Visit (INDEPENDENT_AMBULATORY_CARE_PROVIDER_SITE_OTHER): Payer: PPO | Admitting: Sports Medicine

## 2018-06-16 ENCOUNTER — Encounter: Payer: Self-pay | Admitting: Sports Medicine

## 2018-06-16 VITALS — BP 147/79 | Ht 66.0 in | Wt 207.0 lb

## 2018-06-16 DIAGNOSIS — S39011A Strain of muscle, fascia and tendon of abdomen, initial encounter: Secondary | ICD-10-CM | POA: Diagnosis not present

## 2018-06-16 NOTE — Progress Notes (Signed)
Joshua [name]. Joshua [name], Joshua [name] at [place] - 78 y.o. male MRN [id number]  Date of birth: 1940/12/18  Visit Date: June 16, 2018  PCP: Joshua Olp, MD   Referred by: Joshua Olp, MD   Virtual Visit via Video   I connected with Joshua [name] on 06/16/18 at 11:20 AM EDT by a video enabled telemedicine application and verified that I am speaking with the correct person using two identifiers. Location patient: Home Location provider: [facility], Office Persons participating in the virtual visit: Patient and his wife Joshua [name]  I discussed the limitations of evaluation and management by telemedicine and the availability of in person appointments. The patient expressed understanding and agreed to proceed.  SUBJECTIVE:   Chief Complaint  Patient presents with  . Initial Assessment    Abdominal/groin pain after lifting heavy furniture    HPI: Patient reports having pain 3 inches below his bellybutton following lifting a heavy piece of furniture.  Started having pain that worsens with walking due to this.  Denies any bulges or significant focal pain but does get discomfort when going from sit to stand as well as with prolonged walking.  He denies any changes and urinary habits denies any dysuria or hematuria.  He does report some loose stools with 3 bowel movements in the past 24 hours.  Denies any melena or hematochezia.  He denies any nausea, vomiting, fevers, chills, cough or congestion.  REVIEW OF SYSTEMS: No significant nighttime awakenings due to this issue. Denies fevers, chills, recent weight gain or weight loss.  No night sweats.   HISTORY:  Prior history reviewed and updated per electronic medical record.  Patient Active Problem List   Diagnosis Date Noted  . Abdominal muscle strain, initial encounter 06/16/2018  . Aortic atherosclerosis ([place]) 05/05/2018  . Exertional angina ([place])  04/18/2018  . Senile purpura ([place]) 10/07/2017  . Hyperglycemia 10/07/2017  . Weakness of left leg 06/21/2017  . DJD (degenerative joint disease)     Back pain gets him down   . Arthritis     "middle finger right hand; right knee; neck" (02/06/2014)   . Neck pain 02/08/2017  . Venous insufficiency 12/29/2016  . Ganglion cyst of tendon sheath of right hand 12/29/2016  . Avulsion fracture of metatarsal bone of right foot 11/25/2016  . Gout 11/20/2016  . Obesity, Class I, BMI 30-34.9 10/03/2015  . Hereditary and idiopathic peripheral neuropathy 09/25/2014  . Allergic rhinitis 05/31/2014    clarinex and flonase. Allergy shots.    . Fatigue 11/09/2013  . CKD (chronic kidney disease), stage III ([place]) 10/23/2013    irbesartan   . Dizziness 08/23/2013    At times especially at night.  Has had BPPV as well before.    . Neuropathy ([place]) 08/21/2013    [name]. When feet gets cold, experiences burning, needles pain. Better on gabapentin.    [name] RLS (restless legs syndrome) 01/03/2013    States no longer suffering with this.    . Chronic bilateral low back pain with left-sided sciatica 10/09/2011    Epidural inj 09/27/17, 09/13/17, 07/19/17  MRI 07/01/2017: 1. 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 2. L4-5 advanced spinal stenosis. Moderate right more than left foraminal narrowing. 3. L5-S1 moderate left and mild right foraminal narrowing. Left subarticular recess narrowing that could affect the  left S1 nerve root. 4. Mild levoscoliosis.   [name] History of bladder cancer 04/01/2010    Prostate cancer as well S/p Prostatectomy 2012. Also cautery in bladder. Penile pain since that time.    . OSTEOARTHRITIS, LOWER LEG, LEFT 10/04/2009  . TOBACCO ABUSE, HX OF 12/26/2008    35 pack years. Quit age 61. CT 2016 2.6 x 2.8   . ANEMIA, IRON DEFICIENCY 11/06/2008  . TINNITUS, CHRONIC, BILATERAL  05/05/2007    Likely related to hearing loss.   . Essential hypertension 02/21/2007    irbesartan 300mg  and hctz 25mg  <--Losartan-hctz 100-25 mg <--valsartan-hctz 320-25   . OSA (obstructive sleep apnea) 11/02/2006    NPSG 2009:  AHI 8/hr.  NPSG 2015:  AHI 13/hr, primarily during REM.  No PLMS   . Hyperlipidemia 10/12/2006    Repatha  Simvastatin 20mg  --> trial 40mg . Failed rosuvastatin and atorvastatin with myalgias.    . Depression, major, single episode, in partial remission ([place]) 10/12/2006    Formerly on medication-too tired and sleepy. Lexapro-may consider restarting.    . Coronary atherosclerosis 10/12/2006    ASA, statin. S/p CABG x 5 vessels. Asymptomatic since. Perfusion study 2011 was normal. 02/2014-repeat study low risk   . Asthma 10/12/2006    Follows with Dr. Remus Blake. Controller and albuterol     . GERD 10/12/2006    prilosec 20mg  Trial zantac- worked initially- then failed   . Irritable bowel syndrome 10/12/2006    Constipation Predominant. History of generalized abdominal pain from this. See GI note 02/18/10.      Social History   Occupational History  . Occupation: retired    Fish farm manager: RETIRED  Tobacco Use  . Smoking status: Former Smoker    Packs/day: 1.00    Years: 35.00    Pack years: 35.00    Types: Cigarettes    Last attempt to quit: 06/16/1992    Years since quitting: 26.0  . Smokeless tobacco: Never Used  . Tobacco comment: discussed the AAA but thinks he may have had this   Substance and Sexual Activity  . Alcohol use: Yes    Alcohol/week: 4.0 standard drinks    Types: 4 Shots of liquor per week    Comment: mixed drink  . Drug use: No  . Sexual activity: Never   Social History   Social History Narrative   Married 42 years in 2015. No kids (mumps at age 59)      Retired from [employer] in Engineer, technical sales   Volunteers at [employer]: poker, movies and tv, staying active    OBJECTIVE:  VS:  HT:5\' 6"  (167.6 cm)   WT:207 lb (93.9  kg)  BMI:33.43    BP:(!) 147/79  HR: bpm  TEMP: ( )  RESP:    PHYSICAL EXAM: Adult male. No acute distress.  Alert and appropriate. Patient is sitting comfortably in his home office.  He is able to go from sit to stand without difficulty.  He has no focal pain with direct palpation of his abdomen.  The abdomen is protuberant   ASSESSMENT:   1. Abdominal muscle strain, initial encounter     PROCEDURES:  None  PLAN:  Pertinent additional documentation may be included in corresponding procedure notes, imaging studies, problem based documentation and patient instructions.  No problem-specific Assessment & Plan notes found for this encounter.   Ultimately does have symptoms that are consistent with abdominal wall strain.  Symptomatic treatment per AVS.  Home Therapeutic Exercises: Continue previously  prescribed home exercise program Activity modifications and the importance of avoiding exacerbating activities (limiting pain to no more than a 4 / 10 during or following activity) recommended and discussed.   Discussed red flag symptoms that warrant earlier emergent evaluation and patient voices understanding.   No orders of the defined types were placed in this encounter.  Lab Orders  No laboratory test(s) ordered today   Imaging Orders  No imaging studies ordered today   Referral Orders  No referral(s) requested today    At follow up will plan to consider: repeat osteopathic manipulation  Return in about 4 weeks (around 07/14/2018) for consideration of repeat Osteopathic Manipulation.          Gerda Diss, [facility] Physician

## 2018-06-16 NOTE — Patient Instructions (Addendum)
Try icing your abdomen for 10 to 15 minutes at a time.  Try taking either 500 mg (1 extra strength Tylenol/acetaminophen) or 650 mg (2 regular strength Tylenol/acetaminophen) every 6 hours as needed for pain.  I suspect after the next 3 to 4 days he will be feeling well enough that you may not need this anymore but will still likely have some soreness over the next 1 to  weeks.

## 2018-06-17 DIAGNOSIS — J3081 Allergic rhinitis due to animal (cat) (dog) hair and dander: Secondary | ICD-10-CM | POA: Diagnosis not present

## 2018-06-17 DIAGNOSIS — J3089 Other allergic rhinitis: Secondary | ICD-10-CM | POA: Diagnosis not present

## 2018-06-17 DIAGNOSIS — J301 Allergic rhinitis due to pollen: Secondary | ICD-10-CM | POA: Diagnosis not present

## 2018-06-27 DIAGNOSIS — G4733 Obstructive sleep apnea (adult) (pediatric): Secondary | ICD-10-CM | POA: Diagnosis not present

## 2018-06-27 DIAGNOSIS — R0989 Other specified symptoms and signs involving the circulatory and respiratory systems: Secondary | ICD-10-CM | POA: Diagnosis not present

## 2018-06-28 DIAGNOSIS — J3081 Allergic rhinitis due to animal (cat) (dog) hair and dander: Secondary | ICD-10-CM | POA: Diagnosis not present

## 2018-06-28 DIAGNOSIS — J301 Allergic rhinitis due to pollen: Secondary | ICD-10-CM | POA: Diagnosis not present

## 2018-07-04 ENCOUNTER — Encounter: Payer: Self-pay | Admitting: Family Medicine

## 2018-07-04 ENCOUNTER — Ambulatory Visit: Payer: PPO | Admitting: Sports Medicine

## 2018-07-04 NOTE — Telephone Encounter (Signed)
Pt placed on schedule for tomorrow.

## 2018-07-05 ENCOUNTER — Encounter: Payer: Self-pay | Admitting: Family Medicine

## 2018-07-05 ENCOUNTER — Ambulatory Visit (INDEPENDENT_AMBULATORY_CARE_PROVIDER_SITE_OTHER): Payer: PPO | Admitting: Family Medicine

## 2018-07-05 VITALS — BP 135/71 | HR 75 | Temp 97.0°F | Ht 66.0 in | Wt 203.0 lb

## 2018-07-05 DIAGNOSIS — R519 Headache, unspecified: Secondary | ICD-10-CM

## 2018-07-05 DIAGNOSIS — M542 Cervicalgia: Secondary | ICD-10-CM | POA: Diagnosis not present

## 2018-07-05 DIAGNOSIS — R51 Headache: Secondary | ICD-10-CM | POA: Diagnosis not present

## 2018-07-05 MED ORDER — PREDNISONE 20 MG PO TABS: ORAL_TABLET | ORAL | 0 refills | Status: DC

## 2018-07-05 NOTE — Patient Instructions (Signed)
Video visit

## 2018-07-05 NOTE — Progress Notes (Signed)
Phone [phone]   Subjective:  Virtual visit via Video note. Chief complaint: Chief Complaint  Patient presents with  . Headache  . Neck Pain    This visit type was conducted due to national recommendations for restrictions regarding the COVID-19 Pandemic (e.g. social distancing).  This format is felt to be most appropriate for this patient at this time balancing risks to patient and risks to population by having him in for in person visit.  No physical exam was performed (except for noted visual exam or audio findings with Telehealth visits).    Our team/I connected with Joshua [name] on 07/05/18 at 10:00 AM EDT by a video enabled telemedicine application ([email or web address]) and verified that I am speaking with the correct person using two identifiers.  Location patient: [facility] Location provider: [facility], office Persons participating in the virtual visit:  patient and wife who helped with exam  Our team/I discussed the limitations of evaluation and management by telemedicine and the availability of in person appointments. In light of current covid-19 pandemic, patient also understands that we are trying to protect them by minimizing in office contact if at all possible.  The patient expressed consent for telemedicine visit and agreed to proceed. Patient understands insurance will be billed.   ROS- No facial or extremity weakness. No slurred words or trouble swallowing. no blurry vision or double vision. No newparesthesias (baseline neuropathy). No confusion or new word finding difficulties (always struggles with names)  Past Medical History-  Patient Active Problem List   Diagnosis Date Noted  . Coronary atherosclerosis 10/12/2006    Priority: High  . Irritable bowel syndrome 10/12/2006    Priority: High  . Hyperglycemia 10/07/2017    Priority: Medium  . Gout 11/20/2016    Priority: Medium  . CKD (chronic kidney disease), stage III ([place]) 10/23/2013    Priority: Medium  .  Neuropathy ([place]) 08/21/2013    Priority: Medium  . History of bladder cancer 04/01/2010    Priority: Medium  . Essential hypertension 02/21/2007    Priority: Medium  . Hyperlipidemia 10/12/2006    Priority: Medium  . Depression, major, single episode, in partial remission ([place]) 10/12/2006    Priority: Medium  . Asthma 10/12/2006    Priority: Medium  . 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 DJD (degenerative joint disease)     Priority: Low  . Arthritis     Priority: Low  . Neck pain 02/08/2017    Priority: Low  . Venous insufficiency 12/29/2016    Priority: Low  . Ganglion cyst of tendon sheath of right hand 12/29/2016    Priority: Low  . Avulsion fracture of metatarsal bone of right foot 11/25/2016    Priority: Low  . Obesity, Class I, BMI 30-34.9 10/03/2015    Priority: Low  . Allergic rhinitis 05/31/2014    Priority: Low  . Fatigue 11/09/2013    Priority: Low  . Dizziness 08/23/2013    Priority: Low  . RLS (restless legs syndrome) 01/03/2013    Priority: Low  . Chronic bilateral low back pain with left-sided sciatica 10/09/2011    Priority: Low  . OSTEOARTHRITIS, LOWER LEG, LEFT 10/04/2009    Priority: Low  . TOBACCO ABUSE, HX OF 12/26/2008    Priority: Low  . ANEMIA, IRON DEFICIENCY 11/06/2008    Priority: Low  . 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 OSA (obstructive sleep apnea) 11/02/2006    Priority: Low  . GERD  10/12/2006    Priority: Low  . Abdominal muscle strain, initial encounter 06/16/2018  . Aortic atherosclerosis ([place]) 05/05/2018  . Exertional angina ([place]) 04/18/2018  . Weakness of left leg 06/21/2017  . Hereditary and idiopathic peripheral neuropathy 09/25/2014    Medications- reviewed and updated Current Outpatient Medications  Medication Sig Dispense Refill  . acetaminophen (TYLENOL) 500 MG tablet Take 1,000 mg by mouth every 6 (six) hours as needed for headache (pain). Reported on 07/02/2015    .  albuterol (PROAIR HFA) 108 (90 BASE) MCG/ACT inhaler Inhale 1-2 puffs into the lungs every 6 (six) hours as needed for wheezing or shortness of breath. 18 g 3  . aspirin EC 81 MG tablet Take 81 mg by mouth daily.    . clotrimazole-betamethasone (LOTRISONE) cream USE 0.5 CM TRRANSDERMALLY TWICE A DAY  6  . Cyanocobalamin (VITAMIN B 12 PO) Take 800 mg by mouth daily.    [name] desloratadine (CLARINEX) 5 MG tablet Take 5 mg by mouth at bedtime.     . diazepam (VALIUM) 5 MG tablet Take 1 tablet (5 mg total) by mouth at bedtime as needed. 30 tablet 1  . Evolocumab (REPATHA SURECLICK) 335 MG/ML SOAJ Inject 140 mg into the skin every 14 (fourteen) days. 2 pen 11  . FLOVENT HFA 110 MCG/ACT inhaler Inhale 2 puffs into the lungs 2 (two) times daily. Reported on 03/22/2015  5  . fluticasone (FLONASE) 50 MCG/ACT nasal spray Place 1 spray into both nostrils 2 (two) times daily.     . folic acid (FOLVITE) 456 MCG tablet Take 800 mcg by mouth daily. Patient is taking 1 tablet with breakfast  and 1 tablet at dinner    . gabapentin (NEURONTIN) 100 MG capsule Take 1 cap three times a day, 5 caps at bedtime 720 capsule 3  . hydrochlorothiazide (HYDRODIURIL) 25 MG tablet TAKE 1 TABLET BY MOUTH EVERY DAY 90 tablet 3  . hydrocortisone 2.5 % cream Apply topically 2 (two) times daily.    [name] ketoconazole (NIZORAL) 2 % cream Apply topically 2 (two) times daily.    [name] losartan (COZAAR) 100 MG tablet Take 1 tablet (100 mg total) by mouth daily. 90 tablet 3  . omeprazole (PRILOSEC) 20 MG capsule TAKE 1 CAPSULE BY MOUTH EVERY DAY 90 capsule 1  . ondansetron (ZOFRAN ODT) 8 MG disintegrating tablet Take 1 tablet (8 mg total) by mouth every 8 (eight) hours as needed for nausea or vomiting. 20 tablet 0  . polyethylene glycol powder (GLYCOLAX/MIRALAX) powder Take 17 g by mouth daily. 255 g 0  . silver sulfADIAZINE (SILVADENE) 1 % cream     . simvastatin (ZOCOR) 20 MG tablet TAKE 1 TABLET BY MOUTH EVERYDAY AT BEDTIME 90 tablet 3  . NON  FORMULARY     . nystatin (MYCOSTATIN/NYSTOP) powder Apply topically 4 (four) times daily. (Patient not taking: Reported on 06/16/2018) 15 g 0  . Probiotic Product (ALIGN) 4 MG CAPS Take 4 mg by mouth daily.      No current facility-administered medications for this visit.      Objective:  BP 135/71 (BP Location: Left Arm, Patient Position: Sitting, Cuff Size: Normal)   Pulse 75   Temp (!) 97 F (36.1 C) (Oral)   Ht '5\' 6"'  (1.676 m)   Wt 203 lb (92.1 kg)   BMI 32.77 kg/m  Gen: NAD, resting comfortably Lungs: nonlabored, normal respiratory rate  Skin: appears dry, no obvious rash Neuro : Extraocular motions intact.  Normal cranial nerve VII.  Able to do shoulder shrug and neck turn against resistance from wife.  No tongue deviation.  Able to resist wife well for upper extremity strength.  Good sensation when touched by wife.  No pronator drift.  Patient does admit to worsening headache with neck motion to the right    Assessment and Plan   #Posterior neck ache/headache  S:neck ache started about a month ago  And also has an aching on right side of his head. Similar to what he had before he had plate installed in his neck. Also includes some of his face. Wife also notes some significant tightness around shoudler blade- when he gets this worked on his symptoms actually improve in neck and head. Pain level is perhaps a 3/10. He moves neck on screen and seems to worsen his headache.   Has had vertigo for 2 months. When lays down- really hard to get up. Only gets vertigo with position change.   Pain in his neck is almost constant. Massage and bengay helps somewhat. Switched pillows without relief.   No history of headaches- older brother gets headaches. No blurry or double vision with this. No memory loss. No shoulder or arm pain. Wears TMJ bite plates at night time.   Some more flushing than normal. He feels well outside of these headaches.  A/P: I wonder if this is a cervicogenic  headache- with prior disc disease and neck surgery.  Patient wants to avoid coming to the office for x-rays or imaging at this time.  He would like to delay if at all possible.  We ultimately decided on a course of prednisone-if he has new or worsening symptoms he will let us know.  If he is not better in 3 weeks-consider neck imaging as well as MRI of the head-due to new headache and patient over age 79 - Doubt temporal arteritis given a month of symptoms without any changes in vision.  Patient also does not want to come in for blood work due to risk from COVID-19 which I understand and since risk is low based on history opted out of ESR and CRP  Future Appointments  Date Time Provider [place]  08/18/2018  9:00 AM [name], MD [facility]  09/12/2018  8:30 AM Cameron Sprang, MD [facility] None  11/02/2018  9:20 AM [name], MD [facility]  02/06/2019  9:20 AM [name], MD [facility]   Lab/Order associations: Neck pain  Right-sided headache  Meds ordered this encounter  Medications  . predniSONE (DELTASONE) 20 MG tablet    Sig: Take 2 pills for 3 days, 1 pill for 4 days    Dispense:  10 tablet    Refill:  0   Return precautions advised.  [name], MD

## 2018-07-11 ENCOUNTER — Ambulatory Visit: Payer: PPO | Admitting: Pulmonary Disease

## 2018-07-12 DIAGNOSIS — J3089 Other allergic rhinitis: Secondary | ICD-10-CM | POA: Diagnosis not present

## 2018-07-12 DIAGNOSIS — J3081 Allergic rhinitis due to animal (cat) (dog) hair and dander: Secondary | ICD-10-CM | POA: Diagnosis not present

## 2018-07-12 DIAGNOSIS — J301 Allergic rhinitis due to pollen: Secondary | ICD-10-CM | POA: Diagnosis not present

## 2018-07-13 ENCOUNTER — Encounter: Payer: Self-pay | Admitting: Sports Medicine

## 2018-07-14 ENCOUNTER — Other Ambulatory Visit: Payer: Self-pay

## 2018-07-14 DIAGNOSIS — G609 Hereditary and idiopathic neuropathy, unspecified: Secondary | ICD-10-CM

## 2018-07-14 MED ORDER — GABAPENTIN 100 MG PO CAPS: ORAL_CAPSULE | ORAL | 3 refills | Status: DC

## 2018-07-14 MED ORDER — MELOXICAM 7.5 MG PO TABS: 7.5000 mg | ORAL_TABLET | Freq: Every day | ORAL | 0 refills | Status: DC | PRN

## 2018-07-14 NOTE — Telephone Encounter (Signed)
Per Dr Tat There is still room to increase gabapentin. He can take 200mg  in the morning, 200mg  in the afternoon, and 600mg  at bedtime = 1000mg /d, which is still starting dose of the medication. If he is tolerating, it can be further increased. Pt called spoke to his wife she was stated that she would inform the pt a my chart message was also sent. New prescription sent in got gabapentin

## 2018-07-28 DIAGNOSIS — J3089 Other allergic rhinitis: Secondary | ICD-10-CM | POA: Diagnosis not present

## 2018-07-28 DIAGNOSIS — J3081 Allergic rhinitis due to animal (cat) (dog) hair and dander: Secondary | ICD-10-CM | POA: Diagnosis not present

## 2018-07-28 DIAGNOSIS — J301 Allergic rhinitis due to pollen: Secondary | ICD-10-CM | POA: Diagnosis not present

## 2018-07-29 DIAGNOSIS — G4733 Obstructive sleep apnea (adult) (pediatric): Secondary | ICD-10-CM | POA: Diagnosis not present

## 2018-08-11 DIAGNOSIS — J3089 Other allergic rhinitis: Secondary | ICD-10-CM | POA: Diagnosis not present

## 2018-08-11 DIAGNOSIS — J301 Allergic rhinitis due to pollen: Secondary | ICD-10-CM | POA: Diagnosis not present

## 2018-08-11 DIAGNOSIS — J3081 Allergic rhinitis due to animal (cat) (dog) hair and dander: Secondary | ICD-10-CM | POA: Diagnosis not present

## 2018-08-18 ENCOUNTER — Ambulatory Visit: Payer: PPO | Admitting: Cardiology

## 2018-08-25 DIAGNOSIS — J301 Allergic rhinitis due to pollen: Secondary | ICD-10-CM | POA: Diagnosis not present

## 2018-08-25 DIAGNOSIS — J3089 Other allergic rhinitis: Secondary | ICD-10-CM | POA: Diagnosis not present

## 2018-08-25 DIAGNOSIS — J3081 Allergic rhinitis due to animal (cat) (dog) hair and dander: Secondary | ICD-10-CM | POA: Diagnosis not present

## 2018-08-30 DIAGNOSIS — G4733 Obstructive sleep apnea (adult) (pediatric): Secondary | ICD-10-CM | POA: Diagnosis not present

## 2018-09-05 ENCOUNTER — Ambulatory Visit: Payer: Self-pay | Admitting: Family Medicine

## 2018-09-05 ENCOUNTER — Ambulatory Visit (INDEPENDENT_AMBULATORY_CARE_PROVIDER_SITE_OTHER): Payer: PPO | Admitting: Family Medicine

## 2018-09-05 ENCOUNTER — Other Ambulatory Visit: Payer: Self-pay

## 2018-09-05 ENCOUNTER — Encounter: Payer: Self-pay | Admitting: Family Medicine

## 2018-09-05 VITALS — BP 136/74 | HR 67 | Temp 98.3°F | Ht 66.0 in | Wt 213.2 lb

## 2018-09-05 DIAGNOSIS — L304 Erythema intertrigo: Secondary | ICD-10-CM | POA: Diagnosis not present

## 2018-09-05 DIAGNOSIS — N50812 Left testicular pain: Secondary | ICD-10-CM

## 2018-09-05 DIAGNOSIS — I10 Essential (primary) hypertension: Secondary | ICD-10-CM | POA: Diagnosis not present

## 2018-09-05 NOTE — Progress Notes (Signed)
Phone [phone]   Subjective:  Joshua [name] is a 78 y.o. year old very pleasant male patient who presents for/with See problem oriented charting Chief Complaint  Patient presents with   pain in groin area    lt side x2weeks   ROS- No fever, chills, cough, shortness of breath, body aches, sore throat, or loss of taste or smell. Having some right sided headaches but is going to discuss with [name] in 9 days.    Past Medical History-  Patient Active Problem List   Diagnosis Date Noted   Coronary atherosclerosis 10/12/2006    Priority: High   Irritable bowel syndrome 10/12/2006    Priority: High   Hyperglycemia 10/07/2017    Priority: Medium   Gout 11/20/2016    Priority: Medium   CKD (chronic kidney disease), stage III ([place]) 10/23/2013    Priority: Medium   Neuropathy ([place]) 08/21/2013    Priority: Medium   History of bladder cancer 04/01/2010    Priority: Medium   Essential hypertension 02/21/2007    Priority: Medium   Hyperlipidemia 10/12/2006    Priority: Medium   Depression, major, single episode, in partial remission ([place]) 10/12/2006    Priority: Medium   Asthma 10/12/2006    Priority: Medium   Senile purpura ([place]) 10/07/2017    Priority: Low   DJD (degenerative joint disease)     Priority: Low   Arthritis     Priority: Low   Neck pain 02/08/2017    Priority: Low   Venous insufficiency 12/29/2016    Priority: Low   Ganglion cyst of tendon sheath of right hand 12/29/2016    Priority: Low   Avulsion fracture of metatarsal bone of right foot 11/25/2016    Priority: Low   Obesity, Class I, BMI 30-34.9 10/03/2015    Priority: Low   Allergic rhinitis 05/31/2014    Priority: Low   Fatigue 11/09/2013    Priority: Low   Dizziness 08/23/2013    Priority: Low   RLS (restless legs syndrome) 01/03/2013    Priority: Low   Chronic bilateral low back pain with left-sided sciatica 10/09/2011    Priority: Low   OSTEOARTHRITIS, LOWER  LEG, LEFT 10/04/2009    Priority: Low   TOBACCO ABUSE, HX OF 12/26/2008    Priority: Low   ANEMIA, IRON DEFICIENCY 11/06/2008    Priority: Low   TINNITUS, CHRONIC, 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 Reported on 07/02/2015     albuterol (PROAIR HFA) 108 (90 BASE) MCG/ACT inhaler Inhale 1-2 puffs into the lungs every 6 (six) hours as needed for wheezing or shortness of breath. 18 g 3   aspirin EC 81 MG tablet Take 81 mg by mouth daily.     clotrimazole-betamethasone (LOTRISONE) cream USE 0.5 CM TRRANSDERMALLY TWICE A DAY  6   Cyanocobalamin (VITAMIN B 12 PO) Take 800 mg by mouth daily.     desloratadine (CLARINEX) 5 MG tablet Take 5 mg by mouth  at bedtime.      diazepam (VALIUM) 5 MG tablet Take 1 tablet (5 mg total) by mouth at bedtime as needed. 30 tablet 1   Evolocumab (REPATHA SURECLICK) 389 MG/ML SOAJ Inject 140 mg into the skin every 14 (fourteen) days. 2 pen 11   FLOVENT HFA 110 MCG/ACT inhaler Inhale 2 puffs into the lungs 2 (two) times daily. Reported on 03/22/2015  5   fluticasone (FLONASE) 50 MCG/ACT nasal spray Place 1 spray into both nostrils 2 (two) times daily.      folic acid (FOLVITE) 373 MCG tablet Take 800 mcg by mouth daily. Patient is taking 1 tablet with breakfast  and 1 tablet at dinner     gabapentin (NEURONTIN) 100 MG capsule Take 2 capsules in the morning, 2 capsules in the afternoon and 6 capsules at  bedtime 900 capsule 3   hydrochlorothiazide (HYDRODIURIL) 25 MG tablet TAKE 1 TABLET BY MOUTH EVERY DAY 90 tablet 3   hydrocortisone 2.5 % cream Apply topically 2 (two) times daily.     ketoconazole (NIZORAL) 2 % cream Apply topically 2 (two) times daily.     losartan (COZAAR) 100 MG tablet Take 1 tablet (100 mg total) by mouth daily. 90 tablet 3   meloxicam (MOBIC) 7.5 MG tablet Take 1 tablet (7.5 mg total) by mouth daily as needed for pain. 30 tablet 0   NON FORMULARY      nystatin (MYCOSTATIN/NYSTOP) powder Apply topically 4 (four) times daily. 15 g 0   omeprazole (PRILOSEC) 20 MG capsule TAKE 1 CAPSULE BY MOUTH EVERY DAY 90 capsule 1   ondansetron (ZOFRAN ODT) 8 MG disintegrating tablet Take 1 tablet (8 mg total) by mouth every 8 (eight) hours as needed for nausea or vomiting. 20 tablet 0   polyethylene glycol powder (GLYCOLAX/MIRALAX) powder Take 17 g by mouth daily. 255 g 0   Probiotic Product (ALIGN) 4 MG CAPS Take 4 mg by mouth daily.      silver sulfADIAZINE (SILVADENE) 1 % cream      simvastatin (ZOCOR) 20 MG tablet TAKE 1 TABLET BY MOUTH EVERYDAY AT BEDTIME 90 tablet 3   No current facility-administered medications for this visit.      Objective:  BP 136/74    Pulse 67    Temp 98.3 F (36.8 C) (Oral)    Ht 5\' 6"  (1.676 m)    Wt 213 lb 4 oz (96.7 kg)    SpO2 97%    BMI 34.42 kg/m  Gen: NAD, resting comfortably CV: RRR no murmurs rubs or gallops Lungs: CTAB no crackles, wheeze, rhonchi Abdomen: soft/nontender/nondistended/normal bowel sounds. Ext: trace edema Skin: warm, dry GU: patient tender in central portion of left testicle- diffuse mild pain but not as bad as central portion.     Assessment and Plan   #Left testicular pain S: patient states pain started 2 weeks ago. Patient has history of jock itch/groin itching. Spray on deodorant seemed to help if he sprayed on it- used this in combo with topical steroid ands seemed to burn the scrotum. He went to see  Dr. Ronnald Ramp of dermatologist- was given silvadene and hydrocortisone 2.5% to use- has been using that for last 2 months and that seemed to clear up the burning.   Then a few weeks ago started with pain in the scrotum. Doesn't feel he can walk well as a result. Saw Dr. Ronnald Ramp again today and stated there was nothing wrong with skin. Hydrocortisone seemed to help pain but stains underwear  and pants as leaks through underwear. Right now pain 6/10. Was 7/10 after saw Dr. Ronnald Ramp.   History of penile pain that was better with therapy. No pain when he palpates testicle.  Penile pain has not been worse recently.  A/P: 78 year old male with prior "burn like" pain on his scrotum that resolved with hydrocortisone and Silvadene cream per dermatology- now has central testicular pain on the left side- unclear etiology.  Possible orchitis?  Will evaluate with ultrasound to rule out torsion but doubt this.  No pain over epididymis to suggest epididymitis.  Patient does have history of penile pain-may need to refer back to urology-he would like to consider other options outside of [facility] potentially-can discuss if ultrasound unrevealing  #hypertension S: controlled poorly initially and on repeat controlled  on hctz 25mg , losartan 100mg  BP Readings from Last 3 Encounters:  09/05/18 136/74  07/05/18 135/71  06/16/18 (!) 147/79  A/P: Stable. Continue current medications.   Future Appointments  Date Time Provider [place]  09/14/2018 10:00 AM Cameron Sprang, MD [facility] None  11/02/2018  9:20 AM [name], MD [facility]  11/24/2018  9:00 AM [name], MD [facility]  02/06/2019  9:20 AM [name], MD [facility]   Lab/Order associations:   ICD-10-CM   1. Left testicular pain  N50.812 US SCROTUM W/DOPPLER    CANCELED: US SCROTUM W/DOPPLER  2. Essential hypertension  I10   Upgrade ultrasound to stat   Return precautions advised.  [name], MD

## 2018-09-05 NOTE — Telephone Encounter (Signed)
Pt reports left sided groin pain, radiates "Under scrotum." States onset 2 weeks ago, worsening past week "So I went to a dermatologist this AM."  States pain now 7/10 "Since the dermatologist was probing down there." Pt states he has a hernia of right groin.  Denies swelling, no lumps,no  Lesions, no difficulty voiding. Pt reports dysuria "Since my prostate surgery years ago."  [place] called practice, [place], pt hung up, [place] states will CB pt for appt scheduling.   Reason for Disposition . [1] Pain comes and goes (intermittent) AND [2] present > 24 hours  Answer Assessment - Initial Assessment Questions 1. LOCATION and RADIATION: "Where is the pain located?"     Under scrotum 2. QUALITY: "What does the pain feel like?"  (e.g., sharp, dull, aching, burning)     Sharp, shooting 3. SEVERITY: "How bad is the pain?"  (Scale 1-10; or mild, moderate, severe)   - MILD (1-3): doesn't interfere with normal activities    - MODERATE (4-7): interferes with normal activities (e.g., work or school) or awakens from sleep   - SEVERE (8-10): excruciating pain, unable to do any normal activities, difficulty walking     7/10 4. ONSET: "When did the pain start?"     2 weeks ago 5. PATTERN: "Does it come and go, or has it been constant since it started?"     Constant since seeing dermatologist who "Probed" around 6. SCROTAL APPEARANCE: "What does the scrotum look like?" "Is there any swelling or redness?"     No swelling 7. HERNIA: "Has a doctor ever told you that you have a hernia?"    Right groin hernia 8. OTHER SYMPTOMS: "Do you have any other symptoms?" (e.g., fever, abdominal pain, vomiting, difficulty passing urine)     No, "Always have burning since 2010, prostate surgery.  Protocols used: SCROTAL PAIN-A-AH

## 2018-09-05 NOTE — Patient Instructions (Addendum)
We will call you within the week about your referral for ultrasound. If you do not hear by next monday, give [place] a call.

## 2018-09-05 NOTE — Telephone Encounter (Signed)
See note, patient is scheduled for today

## 2018-09-05 NOTE — Telephone Encounter (Signed)
Noted  

## 2018-09-07 ENCOUNTER — Encounter: Payer: Self-pay | Admitting: Family Medicine

## 2018-09-08 DIAGNOSIS — J301 Allergic rhinitis due to pollen: Secondary | ICD-10-CM | POA: Diagnosis not present

## 2018-09-08 DIAGNOSIS — J3081 Allergic rhinitis due to animal (cat) (dog) hair and dander: Secondary | ICD-10-CM | POA: Diagnosis not present

## 2018-09-08 DIAGNOSIS — J3089 Other allergic rhinitis: Secondary | ICD-10-CM | POA: Diagnosis not present

## 2018-09-09 ENCOUNTER — Ambulatory Visit: Payer: PPO | Admitting: Neurology

## 2018-09-12 ENCOUNTER — Other Ambulatory Visit: Payer: Self-pay

## 2018-09-12 ENCOUNTER — Ambulatory Visit: Payer: PPO | Admitting: Neurology

## 2018-09-12 ENCOUNTER — Ambulatory Visit
Admission: RE | Admit: 2018-09-12 | Discharge: 2018-09-12 | Disposition: A | Discharge status: Home / Self Care | Payer: PPO | Source: Ambulatory Visit | Attending: Family Medicine | Admitting: Family Medicine

## 2018-09-12 DIAGNOSIS — N50812 Left testicular pain: Secondary | ICD-10-CM

## 2018-09-12 DIAGNOSIS — N433 Hydrocele, unspecified: Secondary | ICD-10-CM | POA: Diagnosis not present

## 2018-09-12 DIAGNOSIS — I861 Scrotal varices: Secondary | ICD-10-CM | POA: Diagnosis not present

## 2018-09-13 ENCOUNTER — Encounter: Payer: Self-pay | Admitting: Family Medicine

## 2018-09-13 NOTE — Telephone Encounter (Signed)
Please see message and advise if pt needs a visit, virtual or in office?

## 2018-09-14 ENCOUNTER — Encounter: Payer: Self-pay | Admitting: Neurology

## 2018-09-14 ENCOUNTER — Ambulatory Visit (INDEPENDENT_AMBULATORY_CARE_PROVIDER_SITE_OTHER): Payer: PPO | Admitting: Neurology

## 2018-09-14 ENCOUNTER — Other Ambulatory Visit: Payer: Self-pay

## 2018-09-14 VITALS — BP 126/74 | HR 84 | Temp 97.4°F | Ht 66.0 in | Wt 212.8 lb

## 2018-09-14 DIAGNOSIS — G609 Hereditary and idiopathic neuropathy, unspecified: Secondary | ICD-10-CM | POA: Diagnosis not present

## 2018-09-14 DIAGNOSIS — R200 Anesthesia of skin: Secondary | ICD-10-CM | POA: Diagnosis not present

## 2018-09-14 NOTE — Patient Instructions (Addendum)
1. Continue to monitor symptoms, call our office you would like to do physical therapy or increase gabapentin  2. Follow-up in 6 months, call for any changes
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
## 2018-09-21 DIAGNOSIS — N509 Disorder of male genital organs, unspecified: Secondary | ICD-10-CM | POA: Diagnosis not present

## 2018-09-21 DIAGNOSIS — K4090 Unilateral inguinal hernia, without obstruction or gangrene, not specified as recurrent: Secondary | ICD-10-CM | POA: Diagnosis not present

## 2018-09-26 ENCOUNTER — Other Ambulatory Visit: Payer: Self-pay | Admitting: Family Medicine

## 2018-09-27 ENCOUNTER — Encounter: Payer: Self-pay | Admitting: Family Medicine

## 2018-09-27 DIAGNOSIS — J301 Allergic rhinitis due to pollen: Secondary | ICD-10-CM | POA: Diagnosis not present

## 2018-09-27 DIAGNOSIS — J3089 Other allergic rhinitis: Secondary | ICD-10-CM | POA: Diagnosis not present

## 2018-09-27 DIAGNOSIS — J3081 Allergic rhinitis due to animal (cat) (dog) hair and dander: Secondary | ICD-10-CM | POA: Diagnosis not present

## 2018-09-27 DIAGNOSIS — K4090 Unilateral inguinal hernia, without obstruction or gangrene, not specified as recurrent: Secondary | ICD-10-CM

## 2018-09-29 DIAGNOSIS — B356 Tinea cruris: Secondary | ICD-10-CM | POA: Diagnosis not present

## 2018-09-29 DIAGNOSIS — K4090 Unilateral inguinal hernia, without obstruction or gangrene, not specified as recurrent: Secondary | ICD-10-CM | POA: Diagnosis not present

## 2018-09-30 DIAGNOSIS — G4733 Obstructive sleep apnea (adult) (pediatric): Secondary | ICD-10-CM | POA: Diagnosis not present

## 2018-10-11 DIAGNOSIS — J3089 Other allergic rhinitis: Secondary | ICD-10-CM | POA: Diagnosis not present

## 2018-10-11 DIAGNOSIS — J301 Allergic rhinitis due to pollen: Secondary | ICD-10-CM | POA: Diagnosis not present

## 2018-10-11 DIAGNOSIS — J3081 Allergic rhinitis due to animal (cat) (dog) hair and dander: Secondary | ICD-10-CM | POA: Diagnosis not present

## 2018-10-14 ENCOUNTER — Other Ambulatory Visit: Payer: Self-pay

## 2018-10-14 ENCOUNTER — Ambulatory Visit: Payer: PPO | Admitting: Pulmonary Disease

## 2018-10-14 ENCOUNTER — Encounter: Payer: Self-pay | Admitting: Pulmonary Disease

## 2018-10-14 VITALS — BP 120/76 | HR 82 | Temp 97.8°F | Ht 66.0 in | Wt 214.2 lb

## 2018-10-14 DIAGNOSIS — Z9989 Dependence on other enabling machines and devices: Secondary | ICD-10-CM | POA: Diagnosis not present

## 2018-10-14 DIAGNOSIS — G4733 Obstructive sleep apnea (adult) (pediatric): Secondary | ICD-10-CM | POA: Diagnosis not present

## 2018-10-14 NOTE — Patient Instructions (Signed)
Will arrange for new CPAP machine  Follow up in 2 months after getting new machine

## 2018-10-14 NOTE — Progress Notes (Signed)
[name]  Chief Complaint  Patient presents with  . Follow-up    Patient reports that he uses his CPAP machine nightly and no problems with the mask or machine. He reports that his wife states that he still snores with the machine on.     Constitutional:  BP 120/76 (BP Location: Left Arm, Cuff Size: Normal)   Pulse 82   Temp 97.8 F (36.6 C) (Oral)   Ht 5\' 6"  (1.676 m)   Wt 214 lb 3.2 oz (97.2 kg)   SpO2 96%   BMI 34.57 kg/m   Past Medical History:  Prostate cancer, Pancreatitis, IBS, HTN, HLD, Gout, GERD, DJD, Diverticulosis, Depression, CAD, Bladder cancer, Asthma, OA, Anemia  Brief Summary:  [name] is a 78 y.o. male with obstructive sleep apnea.  He uses CPAP nightly.  Has full face mask and this fits okay.  Not having sinus congestion, sore throat, dry mouth, or aerophagia  His machine is starting to make noise and pressure feeling not consistent.  He has to keep his machine on the floor to minimize these.  He is snoring more also even though he is using his mask.  He has been seen by urology and surgery for pain in his pelvic region.  This sensation is better when he is laying down and doesn't interfere with his sleep.  He also has chronic back pain, but this is manageable.   Physical Exam:   Appearance - well kempt   ENMT - clear nasal mucosa, midline nasal  septum, no oral exudates, no LAN, trachea midline  Respiratory - normal chest wall, normal respiratory effort, no accessory muscle use, no wheeze/rales  CV - s1s2 regular rate and rhythm, no murmurs, no peripheral edema, radial pulses symmetric  GI - soft, non tender, no masses  Lymph - no adenopathy noted in neck and axillary areas  MSK - normal gait  Ext - no cyanosis, clubbing, or joint inflammation noted  Skin - no rashes, lesions, or ulcers  Neuro - normal strength, oriented x 3  Psych - normal mood and affect   Assessment/Plan:    Obstructive sleep apnea. - he is compliant with CPAP and reports benefit - his machine is more than 76 yrs old and no longer working properly - he needs new auto CPAP range 6 to 12 cm H2O - explained he might need repeat home sleep study for insurance coverage prior to getting new machine  Allergic asthma. - he is followed by Dr. Orvil Feil   Patient Instructions  Will arrange for new CPAP machine  Follow up in 2 months after getting new machine     Chesley Mires, MD [place] Pager: [phone] 10/14/2018, 9:58 AM  Flow Sheet    Sleep tests:  PSG 12/25/13 >> AHI 13 Auto CPAP 12/10/17 to 01/08/18 >> used on 30 of 30 nights with average 7 hrs 7 min.  Average AHI 1.8 with median CPAP 9 and 95 th percentile CPAP 11 cm H2O  Cardiac tests:  Echo 05/10/12 >> EF 60 to 65%, mild LVH  Medications:   Allergies as of 10/14/2018      Reactions   Benadryl [diphenhydramine] Other (See Comments)   Pt told not to take because of bypass surgery   Bromfed Nausea And Vomiting   Clarithromycin Other (See Comments)    gastritis   Codeine Other (See Comments)   Trouble breathing   Doxycycline Hyclate Nausea And Vomiting   Modafinil  Other (See Comments)    anxiety-nervousness   Oxycodone-acetaminophen Itching   Promethazine Hcl Other (See Comments)    fainting   Quinolones    Cipro   Telithromycin Nausea And Vomiting   Hydrocodone-acetaminophen Rash   Sulfonamide Derivatives Rash      Medication List       Accurate as of October 14, 2018  9:58 AM. If you have any questions, ask your nurse or doctor.        STOP taking these medications   silver sulfADIAZINE 1 % cream Commonly known as: SILVADENE Stopped by: Chesley Mires, MD     TAKE these medications   acetaminophen 500 MG tablet Commonly known as: TYLENOL Take 1,000 mg by mouth every 6 (six) hours as needed for headache (pain). Reported on 07/02/2015   albuterol 108 (90 Base) MCG/ACT inhaler Commonly  known as: ProAir HFA Inhale 1-2 puffs into the lungs every 6 (six) hours as needed for wheezing or shortness of breath.   Align 4 MG Caps Take 4 mg by mouth daily.   aspirin EC 81 MG tablet Take 81 mg by mouth daily.   clotrimazole-betamethasone cream Commonly known as: LOTRISONE USE 0.5 CM TRRANSDERMALLY TWICE A DAY   desloratadine 5 MG tablet Commonly known as: CLARINEX Take 5 mg by mouth at bedtime.   diazepam 5 MG tablet Commonly known as: VALIUM Take 1 tablet (5 mg total) by mouth at bedtime as needed.   Evolocumab 140 MG/ML Soaj Commonly known as: Repatha SureClick Inject 546 mg into the skin every 14 (fourteen) days.   Flovent HFA 110 MCG/ACT inhaler Generic drug: fluticasone Inhale 2 puffs into the lungs 2 (two) times daily. Reported on 03/22/2015   fluticasone 50 MCG/ACT nasal spray Commonly known as: FLONASE Place 1 spray into both nostrils 2 (two) times daily.   folic acid 568 MCG tablet Commonly known as: FOLVITE Take 400 mcg by mouth daily.   gabapentin 100 MG capsule Commonly known as: NEURONTIN Take 2 capsules in the morning, 2 capsules in the afternoon and 6 capsules at bedtime   hydrochlorothiazide 25 MG tablet Commonly known as: HYDRODIURIL TAKE 1 TABLET BY MOUTH EVERY DAY   hydrocortisone 2.5 % cream Apply topically 2 (two) times daily.   ketoconazole 2 % cream Commonly known as: NIZORAL Apply topically 2 (two) times daily.   losartan 100 MG tablet Commonly known as: COZAAR TAKE 1 TABLET BY MOUTH EVERY DAY   meloxicam 7.5 MG tablet Commonly known as: MOBIC Take 1 tablet (7.5 mg total) by mouth daily as needed for pain.   NON FORMULARY   omeprazole 20 MG capsule Commonly known as: PRILOSEC TAKE 1 CAPSULE BY MOUTH EVERY DAY   ondansetron 8 MG disintegrating tablet Commonly known as: Zofran ODT Take 1 tablet (8 mg total) by mouth every 8 (eight) hours as needed for nausea or vomiting.   polyethylene glycol powder 17 GM/SCOOP powder  Commonly known as: GLYCOLAX/MIRALAX Take 17 g by mouth daily.   simvastatin 20 MG tablet Commonly known as: ZOCOR TAKE 1 TABLET BY MOUTH EVERYDAY AT BEDTIME   VITAMIN B 12 PO Take 800 mg by mouth daily.       Past Surgical History:  He  has a past surgical history that includes Thyroidectomy, partial (1980); Anterior cervical decomp/discectomy fusion (05/26/06); Knee arthroscopy (Right, 1992; 04/2008); Robot assisted laparoscopic radical prostatectomy (04/2010); Nasal septum surgery (Right, 2007); Back surgery; Laparoscopic cholecystectomy (2007); Cardiac catheterization (11/20/1999); and Coronary artery bypass graft (11/20/1999).  Family  History:  His family history includes Diabetes in his brother; Heart disease in his father and mother; Hyperlipidemia in his father and mother.  Social History:  He  reports that he quit smoking about 26 years ago. His smoking use included cigarettes. He has a 35.00 pack-year smoking history. He has never used smokeless tobacco. He reports current alcohol use of about 4.0 standard drinks of alcohol per week. He reports that he does not use drugs.

## 2018-10-22 ENCOUNTER — Telehealth: Payer: Self-pay

## 2018-10-22 ENCOUNTER — Other Ambulatory Visit: Payer: Self-pay

## 2018-10-22 ENCOUNTER — Encounter (HOSPITAL_COMMUNITY): Payer: Self-pay | Admitting: *Deleted

## 2018-10-22 ENCOUNTER — Ambulatory Visit (HOSPITAL_COMMUNITY)
Admission: EM | Admit: 2018-10-22 | Discharge: 2018-10-22 | Disposition: A | Discharge status: Home / Self Care | Payer: PPO

## 2018-10-22 DIAGNOSIS — W06XXXA Fall from bed, initial encounter: Secondary | ICD-10-CM | POA: Diagnosis not present

## 2018-10-22 DIAGNOSIS — S0181XA Laceration without foreign body of other part of head, initial encounter: Secondary | ICD-10-CM | POA: Diagnosis not present

## 2018-10-22 DIAGNOSIS — S0083XA Contusion of other part of head, initial encounter: Secondary | ICD-10-CM

## 2018-10-22 DIAGNOSIS — R51 Headache: Secondary | ICD-10-CM

## 2018-10-22 DIAGNOSIS — Y92009 Unspecified place in unspecified non-institutional (private) residence as the place of occurrence of the external cause: Secondary | ICD-10-CM | POA: Diagnosis not present

## 2018-10-22 DIAGNOSIS — W19XXXA Unspecified fall, initial encounter: Secondary | ICD-10-CM

## 2018-10-22 DIAGNOSIS — S0033XA Contusion of nose, initial encounter: Secondary | ICD-10-CM

## 2018-10-22 DIAGNOSIS — R519 Headache, unspecified: Secondary | ICD-10-CM

## 2018-10-22 NOTE — ED Provider Notes (Addendum)
MRN: [id number] DOB: September 23, 1940  Subjective:   Joshua [name] is a 78 y.o. male presenting for acute onset of facial pain, bruising and swelling from an accidental fall this morning.  Patient reports that he was doing exercise stretches on his bed and always has a difficult time with them.  Unfortunately, he fell and hit the corner of a nightstand.  Denies loss of consciousness, confusion, vision changes, headache, dizziness.  Patient reports that he has had a difficult time getting the bleeding to stop.  Has a history of CABG x5.  Tdap last updated 2017.  No current facility-administered medications for this encounter.   Current Outpatient Medications:  .  aspirin EC 81 MG tablet, Take 81 mg by mouth daily., Disp: , Rfl:  .  Cyanocobalamin (VITAMIN B 12 PO), Take 800 mg by mouth daily., Disp: , Rfl:  .  desloratadine (CLARINEX) 5 MG tablet, Take 5 mg by mouth at bedtime. , Disp: , Rfl:  .  diazepam (VALIUM) 5 MG tablet, Take 1 tablet (5 mg total) by mouth at bedtime as needed., Disp: 30 tablet, Rfl: 1 .  Evolocumab (REPATHA SURECLICK) 086 MG/ML SOAJ, Inject 140 mg into the skin every 14 (fourteen) days., Disp: 2 pen, Rfl: 11 .  FLOVENT HFA 110 MCG/ACT inhaler, Inhale 2 puffs into the lungs 2 (two) times daily. Reported on 03/22/2015, Disp: , Rfl: 5 .  fluticasone (FLONASE) 50 MCG/ACT nasal spray, Place 1 spray into both nostrils 2 (two) times daily. , Disp: , Rfl:  .  folic acid (FOLVITE) 761 MCG tablet, Take 400 mcg by mouth daily. , Disp: , Rfl:  .  gabapentin (NEURONTIN) 100 MG capsule, Take 2 capsules in the morning, 2 capsules in the afternoon and 6 capsules at bedtime, Disp: 900 capsule, Rfl: 3 .  hydrochlorothiazide (HYDRODIURIL) 25 MG tablet, TAKE 1 TABLET BY MOUTH EVERY DAY, Disp: 90 tablet, Rfl: 3 .  losartan (COZAAR) 100 MG tablet, TAKE 1 TABLET BY MOUTH EVERY DAY, Disp: 90 tablet, Rfl: 3 .  meloxicam (MOBIC) 7.5 MG tablet, Take 1 tablet (7.5 mg total) by mouth daily as needed for  pain., Disp: 30 tablet, Rfl: 0 .  NON FORMULARY, , Disp: , Rfl:  .  omeprazole (PRILOSEC) 20 MG capsule, TAKE 1 CAPSULE BY MOUTH EVERY DAY, Disp: 90 capsule, Rfl: 1 .  Probiotic Product (ALIGN) 4 MG CAPS, Take 4 mg by mouth daily. , Disp: , Rfl:  .  simvastatin (ZOCOR) 20 MG tablet, TAKE 1 TABLET BY MOUTH EVERYDAY AT BEDTIME, Disp: 90 tablet, Rfl: 3 .  acetaminophen (TYLENOL) 500 MG tablet, Take 1,000 mg by mouth every 6 (six) hours as needed for headache (pain). Reported on 07/02/2015, Disp: , Rfl:  .  albuterol (PROAIR HFA) 108 (90 BASE) MCG/ACT inhaler, Inhale 1-2 puffs into the lungs every 6 (six) hours as needed for wheezing or shortness of breath., Disp: 18 g, Rfl: 3 .  clotrimazole-betamethasone (LOTRISONE) cream, USE 0.5 CM TRRANSDERMALLY TWICE A DAY, Disp: , Rfl: 6 .  hydrocortisone 2.5 % cream, Apply topically 2 (two) times daily., Disp: , Rfl:  .  ketoconazole (NIZORAL) 2 % cream, Apply topically 2 (two) times daily., Disp: , Rfl:  .  ondansetron (ZOFRAN ODT) 8 MG disintegrating tablet, Take 1 tablet (8 mg total) by mouth every 8 (eight) hours as needed for nausea or vomiting. (Patient not taking: Reported on 09/14/2018), Disp: 20 tablet, Rfl: 0 .  polyethylene glycol powder (GLYCOLAX/MIRALAX) powder, Take 17 g by mouth daily.,  Disp: 255 g, Rfl: 0    Allergies  Allergen Reactions  . 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 Bromfed Nausea And Vomiting  . Clarithromycin Other (See Comments)     gastritis  . Codeine Other (See Comments)    Trouble breathing  . Doxycycline Hyclate Nausea And Vomiting  . Modafinil Other (See Comments)     anxiety-nervousness  . Oxycodone-Acetaminophen Itching  . Promethazine Hcl Other (See Comments)     fainting  . Quinolones     Cipro  . Telithromycin Nausea And Vomiting  . Hydrocodone-Acetaminophen Rash  . Sulfonamide Derivatives Rash    Past Medical History:  Diagnosis Date  .  Acute medial meniscal tear   . Anemia   . Arthritis    "middle finger right hand; right knee; neck" (02/06/2014)  . Asthma   . Bladder cancer ([place]) 04/2010   "cauterized during prostate OR"  . CAD (coronary artery disease)    CABG 2001  . Depression   . Diverticulosis   . DJD (degenerative joint disease)   . GERD (gastroesophageal reflux disease)   . History of gout   . Hyperlipidemia   . Hypertension   . IBS (irritable bowel syndrome)   . Obesity   . OSA (obstructive sleep apnea)   . Pancreatitis ~ 1980  . Prostate cancer ([place]) 04/2010     Past Surgical History:  Procedure Laterality Date  . ANTERIOR CERVICAL DECOMP/DISCECTOMY FUSION  05/26/06  . BACK SURGERY    . CARDIAC CATHETERIZATION  11/20/1999  . CORONARY ARTERY BYPASS GRAFT  11/20/1999   SVG-RI1-RI2, SVG-OM, SVG-dRCA  . KNEE ARTHROSCOPY Right 1992; 04/2008  . LAPAROSCOPIC CHOLECYSTECTOMY  2007  . NASAL SEPTUM SURGERY Right 2007  . ROBOT ASSISTED LAPAROSCOPIC RADICAL PROSTATECTOMY  04/2010  . THYROIDECTOMY, PARTIAL  1980    ROS  Objective:   Vitals: BP 139/83   Temp 97.7 F (36.5 C) (Other (Comment))   Resp 20   SpO2 95%   Physical Exam Constitutional:      Appearance: Normal appearance. He is well-developed and normal weight.  HENT:     Head: Normocephalic and atraumatic.      Right Ear: External ear normal.     Left Ear: External ear normal.     Nose: Nose normal.     Mouth/Throat:     Pharynx: Oropharynx is clear.  Eyes:     Extraocular Movements: Extraocular movements intact.     Pupils: Pupils are equal, round, and reactive to light.  Cardiovascular:     Rate and Rhythm: Normal rate.  Pulmonary:     Effort: Pulmonary effort is normal.  Neurological:     General: No focal deficit present.     Mental Status: He is alert and oriented to person, place, and time.     Cranial Nerves: No cranial nerve deficit.     Motor: No weakness.     Coordination: Coordination normal.     Gait: Gait normal.      Deep Tendon Reflexes: Reflexes normal.     Comments: Speech is intact, negative Romberg and pronator drift.  Psychiatric:        Mood and Affect: Mood normal.        Behavior: Behavior normal.        Thought Content: Thought content normal.        Judgment: Judgment normal.    PROCEDURE NOTE: laceration repair Verbal consent  obtained from patient.  Wound cleansed, explored and irrigated.  No local anesthesia used since it was Dermabond.  Wound repaired using Dermabond.  Assessment and Plan :   1. Contusion of face, initial encounter   2. Fall in home, initial encounter   3. Contusion of nose, initial encounter   4. Facial laceration, initial encounter   5. Facial pain     Laceration repaired successfully. Wound care reviewed. Return-to-clinic precautions discussed, patient verbalized understanding. Counseled patient on potential for adverse effects with medications prescribed/recommended today, ER and return-to-clinic precautions discussed, patient verbalized understanding.    Jaynee Eagles, [place] 11/02/18 [phone]

## 2018-10-22 NOTE — ED Triage Notes (Signed)
Reports doing an exercise on the bed this AM when he slipped, hitting face on corner of nightstand.  Small laceration to left lateral nose w/ surrounding ecchymosis; bleeding controlled.  Denies LOC, neck pain, or HA.

## 2018-10-22 NOTE — Telephone Encounter (Signed)
Pt called to ask about Saturday clinic. He reports while exercising this morning he fell and hit his face on a sharp corner of a nightstand. He reports nose bleed and a "dent" in his nose. I advised pt we are only seeing virtual visits and recommended he seek evaluation in UC as soon as possible as he believes his nose may be broken. He asked about waiting until Monday and I advised he seek care this morning as treatment of facial fractures have better outcomes when done immediately. Pt agreed and will have his wife drive him to [facility].   Dr. Yong Channel - FYI. Thanks!

## 2018-10-24 NOTE — Telephone Encounter (Signed)
Noted thanks °

## 2018-10-26 DIAGNOSIS — J301 Allergic rhinitis due to pollen: Secondary | ICD-10-CM | POA: Diagnosis not present

## 2018-10-26 DIAGNOSIS — J3089 Other allergic rhinitis: Secondary | ICD-10-CM | POA: Diagnosis not present

## 2018-10-26 DIAGNOSIS — J3081 Allergic rhinitis due to animal (cat) (dog) hair and dander: Secondary | ICD-10-CM | POA: Diagnosis not present

## 2018-10-27 ENCOUNTER — Other Ambulatory Visit: Payer: Self-pay

## 2018-10-27 ENCOUNTER — Encounter: Payer: Self-pay | Admitting: Family Medicine

## 2018-10-27 ENCOUNTER — Ambulatory Visit: Payer: Self-pay

## 2018-10-27 ENCOUNTER — Ambulatory Visit (INDEPENDENT_AMBULATORY_CARE_PROVIDER_SITE_OTHER): Payer: PPO | Admitting: Family Medicine

## 2018-10-27 ENCOUNTER — Other Ambulatory Visit (INDEPENDENT_AMBULATORY_CARE_PROVIDER_SITE_OTHER): Payer: PPO

## 2018-10-27 VITALS — BP 120/84 | HR 78 | Ht 66.0 in | Wt 214.0 lb

## 2018-10-27 DIAGNOSIS — M5136 Other intervertebral disc degeneration, lumbar region: Secondary | ICD-10-CM

## 2018-10-27 DIAGNOSIS — M25552 Pain in left hip: Secondary | ICD-10-CM

## 2018-10-27 DIAGNOSIS — M7062 Trochanteric bursitis, left hip: Secondary | ICD-10-CM | POA: Diagnosis not present

## 2018-10-27 DIAGNOSIS — M51369 Other intervertebral disc degeneration, lumbar region without mention of lumbar back pain or lower extremity pain: Secondary | ICD-10-CM

## 2018-10-27 LAB — FERRITIN: Ferritin: 155.9 ng/mL (ref 22.0–322.0)

## 2018-10-27 LAB — IBC PANEL
Iron: 118 ug/dL (ref 42–165)
Saturation Ratios: 32.5 % (ref 20.0–50.0)
Transferrin: 259 mg/dL (ref 212.0–360.0)

## 2018-10-27 LAB — URIC ACID: Uric Acid, Serum: 6.3 mg/dL (ref 4.0–7.8)

## 2018-10-27 LAB — SEDIMENTATION RATE: Sed Rate: 11 mm/hr (ref 0–20)

## 2018-10-27 LAB — C-REACTIVE PROTEIN: CRP: <1 mg/dL (ref 0.5–20.0)

## 2018-10-27 LAB — VITAMIN D 25 HYDROXY (VIT D DEFICIENCY, FRACTURES): VITD: 27.18 ng/mL — ABNORMAL LOW (ref 30.00–100.00)

## 2018-10-27 LAB — TESTOSTERONE: Testosterone: 240.87 ng/dL — ABNORMAL LOW (ref 300.00–890.00)

## 2018-10-27 LAB — TSH: TSH: 1.31 u[IU]/mL (ref 0.35–4.50)

## 2018-10-27 NOTE — Patient Instructions (Addendum)
Good to see you.  Ice 20 minutes 2 times daily. Usually after activity and before bed. Exercises 3 times a week.   Tart cherry extract 1200mg  at night Vitamin D 4000 IU daily  See me again in 5-6 weeks

## 2018-10-27 NOTE — Assessment & Plan Note (Signed)
Discussed core stability, home exercise, icing regimen.  Follow-up again in 4 to 8 weeks

## 2018-10-27 NOTE — Assessment & Plan Note (Signed)
Patient given injection.  Tolerated the procedure well.  Discussed icing regimen and home exercise.  Discussed which activities to do which was to avoid.  Patient is to increase activity slowly over the course of next several weeks.  Follow-up again in 4 to 8 weeks I do believe that the lumbar radiculopathy likely is contributing to some and we discussed over-the-counter medicines as well.

## 2018-10-27 NOTE — Progress Notes (Signed)
[name] Joshua [name], [place] [address] Phone: [phone] Subjective:   Joshua [name], am serving as a scribe for Dr. Hulan Saas.  I'm seeing this patient by the request  of:    CC: Hip and back pain  LDJ:[id number]  Joshua [name] is a 78 y.o. male coming in with complaint of left hip and back pain. Patient states that he has had pain for 2 weeks. Pain occurring in front of leg and back in glute. Intermittent tingling in left quad with [name] pattern. Pain is present with sitting or standing. Does like to walk for exercise but has not been able to walk for 3 months due to pain. Has had epidurals from Dr. [name] which did not help his pain last summer. Does use Tylenol for Arthritis and once a week he takes meloxicam.    Patient did have an MRI done on July 01, 2017.  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  Patient has had 3 specific epidurals over the course last several months last one is in July 2019.  Past Medical History:  Diagnosis Date  . Acute medial meniscal tear   . Anemia   . Arthritis    "middle finger right hand; right knee; neck" (02/06/2014)  . Asthma   . Bladder cancer ([facility]) 04/2010   "cauterized during prostate OR"  . CAD (coronary artery disease)    CABG 2001  . Depression   . Diverticulosis   . DJD (degenerative joint disease)   . GERD (gastroesophageal reflux disease)   . History of gout   . Hyperlipidemia   . Hypertension   . IBS (irritable bowel syndrome)   . Obesity   . OSA (obstructive sleep apnea)   . Pancreatitis ~ 1980  . Prostate cancer ([place]) 04/2010   Past Surgical History:  Procedure Laterality Date  . ANTERIOR CERVICAL DECOMP/DISCECTOMY FUSION  05/26/06  . BACK SURGERY    . CARDIAC CATHETERIZATION  11/20/1999  . CORONARY ARTERY BYPASS GRAFT  11/20/1999   SVG-RI1-RI2, SVG-OM, SVG-dRCA  . KNEE ARTHROSCOPY Right  1992; 04/2008  . LAPAROSCOPIC CHOLECYSTECTOMY  2007  . NASAL SEPTUM SURGERY Right 2007  . ROBOT ASSISTED LAPAROSCOPIC RADICAL PROSTATECTOMY  04/2010  . THYROIDECTOMY, PARTIAL  1980   Social History   Socioeconomic History  . Marital status: Married    Spouse name: Not on file  . Number of children: Not on file  . Years of education: Not on file  . Highest education level: Not on file  Occupational History  . Occupation: retired    Fish farm manager: RETIRED  Social Needs  . Financial resource strain: Not on file  . Food insecurity    Worry: Not on file    Inability: Not on file  . Transportation needs    Medical: Not on file    Non-medical: Not on file  Tobacco Use  . Smoking status: Former Smoker    Packs/day: 1.00    Years: 35.00    Pack years: 35.00    Types: Cigarettes    Quit date: 06/16/1992    Years since quitting: 26.3  . Smokeless tobacco: Never Used  . Tobacco comment: discussed the AAA but thinks he may have had this   Substance and Sexual Activity  . Alcohol use: Yes    Alcohol/week: 7.0 standard drinks    Types: 7 Shots of liquor per  week    Comment: mixed drink  . Drug use: [name]  . Sexual activity: Not on file  Lifestyle  . Physical activity    Days per week: Not on file    Minutes per session: Not on file  . Stress: Not on file  Relationships  . Social Herbalist on phone: Not on file    Gets together: Not on file    Attends religious service: Not on file    Active member of club or organization: Not on file    Attends meetings of clubs or organizations: Not on file    Relationship status: Not on file  Other Topics Concern  . Not on file  Social History Narrative   Married 42 years in 2015. [name] kids (mumps at age 84)      Retired from [employer] in [place] at [place]: poker, movies and tv, staying active      Right handed   2 level home with steps he uses   Allergies  Allergen Reactions  . Benadryl [Diphenhydramine]  Other (See Comments)    Pt told not to take because of bypass surgery  . Bromfed Nausea And Vomiting  . Clarithromycin Other (See Comments)     gastritis  . Codeine Other (See Comments)    Trouble breathing  . Doxycycline Hyclate Nausea And Vomiting  . Modafinil Other (See Comments)     anxiety-nervousness  . Oxycodone-Acetaminophen Itching  . Promethazine Hcl Other (See Comments)     fainting  . Quinolones     Cipro  . Telithromycin Nausea And Vomiting  . Hydrocodone-Acetaminophen Rash  . Sulfonamide Derivatives Rash   Family History  Problem Relation Age of Onset  . Heart disease Mother   . Hyperlipidemia Mother   . Heart disease Father   . Hyperlipidemia Father   . Diabetes Brother      Current Outpatient Medications (Cardiovascular):  [name]  Evolocumab (REPATHA SURECLICK) 564 MG/ML SOAJ, Inject 140 mg into the skin every 14 (fourteen) days. .  hydrochlorothiazide (HYDRODIURIL) 25 MG tablet, TAKE 1 TABLET BY MOUTH EVERY DAY .  losartan (COZAAR) 100 MG tablet, TAKE 1 TABLET BY MOUTH EVERY DAY .  simvastatin (ZOCOR) 20 MG tablet, TAKE 1 TABLET BY MOUTH EVERYDAY AT BEDTIME  Current Outpatient Medications (Respiratory):  .  albuterol (PROAIR HFA) 108 (90 BASE) MCG/ACT inhaler, Inhale 1-2 puffs into the lungs every 6 (six) hours as needed for wheezing or shortness of breath. .  desloratadine (CLARINEX) 5 MG tablet, Take 5 mg by mouth at bedtime.  [name]  FLOVENT HFA 110 MCG/ACT inhaler, Inhale 2 puffs into the lungs 2 (two) times daily. Reported on 03/22/2015 .  fluticasone (FLONASE) 50 MCG/ACT nasal spray, Place 1 spray into both nostrils 2 (two) times daily.   Current Outpatient Medications (Analgesics):  .  acetaminophen (TYLENOL) 500 MG tablet, Take 1,000 mg by mouth every 6 (six) hours as needed for headache (pain). Reported on 07/02/2015 .  aspirin EC 81 MG tablet, Take 81 mg by mouth daily. .  meloxicam (MOBIC) 7.5 MG tablet, Take 1 tablet (7.5 mg total) by mouth daily as  needed for pain.  Current Outpatient Medications (Hematological):  [name]  Cyanocobalamin (VITAMIN B 12 PO), Take 800 mg by mouth daily. .  folic acid (FOLVITE) 332 MCG tablet, Take 400 mcg by mouth daily.   Current Outpatient Medications (Other):  .  clotrimazole-betamethasone (LOTRISONE) cream, USE 0.5 CM  TRRANSDERMALLY TWICE A DAY .  diazepam (VALIUM) 5 MG tablet, Take 1 tablet (5 mg total) by mouth at bedtime as needed. .  gabapentin (NEURONTIN) 100 MG capsule, Take 2 capsules in the morning, 2 capsules in the afternoon and 6 capsules at bedtime .  hydrocortisone 2.5 % cream, Apply topically 2 (two) times daily. [name]  ketoconazole (NIZORAL) 2 % cream, Apply topically 2 (two) times daily. .  NON FORMULARY,  .  omeprazole (PRILOSEC) 20 MG capsule, TAKE 1 CAPSULE BY MOUTH EVERY DAY .  ondansetron (ZOFRAN ODT) 8 MG disintegrating tablet, Take 1 tablet (8 mg total) by mouth every 8 (eight) hours as needed for nausea or vomiting. .  polyethylene glycol powder (GLYCOLAX/MIRALAX) powder, Take 17 g by mouth daily. .  Probiotic Product (ALIGN) 4 MG CAPS, Take 4 mg by mouth daily.     Past medical history, social, surgical and family history all reviewed in electronic medical record.  [name] pertanent information unless stated regarding to the chief complaint.   Review of Systems:  [name] headache, visual changes, nausea, vomiting, diarrhea, constipation, dizziness, abdominal pain, skin rash, fevers, chills, night sweats, weight loss, swollen lymph nodes, body aches, joint swelling, muscle aches, chest pain, shortness of breath, mood changes.   Objective  Blood pressure 120/84, pulse 78, height 5\' 6"  (1.676 m), weight 214 lb (97.1 kg), SpO2 95 %.    General: [name] apparent distress alert and oriented x3 mood and affect normal, dressed appropriately.  HEENT: Pupils equal, extraocular movements intact  Respiratory: Patient's speak in full sentences and does not appear short of breath  Cardiovascular: [name] lower  extremity edema, non tender, [name] erythema  Skin: Warm dry intact with [name] signs of infection or rash on extremities or on axial skeleton.  Abdomen: Soft nontender  Neuro: Cranial nerves II through XII are intact, neurovascularly intact in all extremities with 2+ DTRs and 2+ pulses.  Lymph: [name] lymphadenopathy of posterior or anterior cervical chain or axillae bilaterally.  Gait antalgic gait MSK:  tender with full range of motion and good stability and symmetric strength and tone of shoulders, elbows, wrist, knee and ankles bilaterally.  Left hip exam shows the patient does have decreasing range of motion in all planes especially internal.  Severe tenderness times over the greater trochanteric area on the left side.  Patient straight leg test shows some mild radicular symptoms down the left leg but consistent on the right side as well.  Patient's low back exam shows loss of lordosis.  4+ out of 5 strength of the lower extremities.  Deep tendon reflexes do appear to be intact.   Procedure: Real-time Ultrasound Guided Injection of left greater trochanteric bursitis secondary to patient's body habitus Device: GE Logiq Q7 Ultrasound guided injection is preferred based studies that show increased duration, increased effect, greater accuracy, decreased procedural pain, increased response rate, and decreased cost with ultrasound guided versus blind injection.  Verbal informed consent obtained.  Time-out conducted.  Noted [name] overlying erythema, induration, or other signs of local infection.  Skin prepped in a sterile fashion.  Local anesthesia: Topical Ethyl chloride.  With sterile technique and under real time ultrasound guidance:  Greater trochanteric area was visualized and patient's bursa was noted. A 22-gauge 3 inch needle was inserted and 4 cc of 0.5% Marcaine and 1 cc of Kenalog 40 mg/dL was injected. Pictures taken Completed without difficulty  Pain immediately resolved suggesting accurate placement  of the medication.  Advised to call if fevers/chills, erythema, induration,  drainage, or persistent bleeding.  Images permanently stored and available for review in the ultrasound unit.  Impression: Technically successful ultrasound guided injection.   Impression and Recommendations:     This case required medical decision making of moderate complexity. The above documentation has been reviewed and is accurate and complete Joshua Pulley, DO       Note: This dictation was prepared with Dragon dictation along with smaller phrase technology. Any transcriptional errors that result from this process are unintentional.

## 2018-10-28 LAB — PTH, INTACT AND CALCIUM
Calcium: 9.1 mg/dL (ref 8.6–10.3)
PTH: 31 pg/mL (ref 14–64)

## 2018-10-29 LAB — PTH, INTACT AND CALCIUM
Calcium: 9.8 mg/dL (ref 8.6–10.3)
PTH: 14 pg/mL (ref 14–64)

## 2018-10-29 LAB — RHEUMATOID FACTOR: Rhuematoid fact SerPl-aCnc: <14 IU/mL (ref <14)

## 2018-10-29 LAB — ANTI-NUCLEAR AB-TITER (ANA TITER)
ANA TITER: 1:80 {titer} — ABNORMAL HIGH
ANA Titer 1: 1:80 {titer} — ABNORMAL HIGH

## 2018-10-29 LAB — ANA: Anti Nuclear Antibody (ANA): POSITIVE — AB

## 2018-10-29 LAB — CYCLIC CITRUL PEPTIDE ANTIBODY, IGG: Cyclic Citrullin Peptide Ab: <16 UNITS

## 2018-10-29 LAB — ANGIOTENSIN CONVERTING ENZYME: Angiotensin-Converting Enzyme: 40 U/L (ref 9–67)

## 2018-10-29 LAB — CALCIUM, IONIZED: Calcium, Ion: 5.3 mg/dL (ref 4.8–5.6)

## 2018-10-31 DIAGNOSIS — G4733 Obstructive sleep apnea (adult) (pediatric): Secondary | ICD-10-CM | POA: Diagnosis not present

## 2018-11-02 ENCOUNTER — Other Ambulatory Visit: Payer: Self-pay

## 2018-11-02 ENCOUNTER — Ambulatory Visit (INDEPENDENT_AMBULATORY_CARE_PROVIDER_SITE_OTHER): Payer: PPO | Admitting: Family Medicine

## 2018-11-02 ENCOUNTER — Encounter: Payer: Self-pay | Admitting: Family Medicine

## 2018-11-02 VITALS — BP 104/60 | HR 87 | Temp 97.9°F | Ht 66.0 in | Wt 211.8 lb

## 2018-11-02 DIAGNOSIS — E785 Hyperlipidemia, unspecified: Secondary | ICD-10-CM | POA: Diagnosis not present

## 2018-11-02 DIAGNOSIS — R739 Hyperglycemia, unspecified: Secondary | ICD-10-CM

## 2018-11-02 DIAGNOSIS — N183 Chronic kidney disease, stage 3 unspecified: Secondary | ICD-10-CM

## 2018-11-02 DIAGNOSIS — F324 Major depressive disorder, single episode, in partial remission: Secondary | ICD-10-CM | POA: Diagnosis not present

## 2018-11-02 DIAGNOSIS — R7989 Other specified abnormal findings of blood chemistry: Secondary | ICD-10-CM | POA: Diagnosis not present

## 2018-11-02 DIAGNOSIS — I10 Essential (primary) hypertension: Secondary | ICD-10-CM

## 2018-11-02 NOTE — Progress Notes (Signed)
Phone [phone]   Subjective:  Joshua [name] is a 78 y.o. year old very pleasant male patient who presents for/with See problem oriented charting Chief Complaint  Patient presents with  . Follow-up  . Hypertension  . Hyperlipidemia  . Depression  . Chronic Kidney Disease  . Peripheral Neuropathy   ROS- Denies HA, CP, SOB, visual changes. Dealing with back pain   Past Medical History-  Patient Active Problem List   Diagnosis Date Noted  . Coronary atherosclerosis 10/12/2006    Priority: High  . Irritable bowel syndrome 10/12/2006    Priority: High  . Hyperglycemia 10/07/2017    Priority: Medium  . Gout 11/20/2016    Priority: Medium  . CKD (chronic kidney disease), stage III ([place]) 10/23/2013    Priority: Medium  . Neuropathy ([place]) 08/21/2013    Priority: Medium  . History of bladder cancer 04/01/2010    Priority: Medium  . Essential hypertension 02/21/2007    Priority: Medium  . Hyperlipidemia 10/12/2006    Priority: Medium  . Depression, major, single episode, in partial remission ([place]) 10/12/2006    Priority: Medium  . Asthma 10/12/2006    Priority: Medium  . Senile purpura ([place]) 10/07/2017    Priority: Low  . DJD (degenerative joint disease)     Priority: Low  . Arthritis     Priority: Low  . Neck pain 02/08/2017    Priority: Low  . Venous insufficiency 12/29/2016    Priority: Low  . Ganglion cyst of tendon sheath of right hand 12/29/2016    Priority: Low  . Avulsion fracture of metatarsal bone of right foot 11/25/2016    Priority: Low  . Obesity, Class I, BMI 30-34.9 10/03/2015    Priority: Low  . Allergic rhinitis 05/31/2014    Priority: Low  . Fatigue 11/09/2013    Priority: Low  . Dizziness 08/23/2013    Priority: Low  . RLS (restless legs syndrome) 01/03/2013    Priority: Low  . Chronic bilateral low back pain with left-sided sciatica 10/09/2011    Priority: Low  . OSTEOARTHRITIS, LOWER LEG, LEFT 10/04/2009    Priority: Low  .  TOBACCO ABUSE, HX OF 12/26/2008    Priority: Low  . ANEMIA, IRON DEFICIENCY 11/06/2008    Priority: Low  . 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 OSA (obstructive sleep apnea) 11/02/2006    Priority: Low  . GERD 10/12/2006    Priority: Low  . Greater trochanteric bursitis of left hip 10/27/2018  . Degenerative disc disease, lumbar 10/27/2018  . Abdominal muscle strain, initial encounter 06/16/2018  . Aortic atherosclerosis ([place]) 05/05/2018  . Exertional angina ([place]) 04/18/2018  . Weakness of left leg 06/21/2017  . Hereditary and idiopathic peripheral neuropathy 09/25/2014    Medications- reviewed and updated Current Outpatient Medications  Medication Sig Dispense Refill  . acetaminophen (TYLENOL) 500 MG tablet Take 1,000 mg by mouth every 6 (six) hours as needed for headache (pain). Reported on 07/02/2015    . albuterol (PROAIR HFA) 108 (90 BASE) MCG/ACT inhaler Inhale 1-2 puffs into the lungs every 6 (six) hours as needed for wheezing or shortness of breath. 18 g 3  . aspirin EC 81 MG tablet Take 81 mg by mouth daily.    . clotrimazole-betamethasone (LOTRISONE) cream USE 0.5 CM TRRANSDERMALLY TWICE A DAY  6  . Cyanocobalamin (VITAMIN B 12 PO) Take 800 mg by mouth daily.    [name] desloratadine (CLARINEX) 5 MG tablet Take 5 mg by  mouth at bedtime.     . diazepam (VALIUM) 5 MG tablet Take 1 tablet (5 mg total) by mouth at bedtime as needed. 30 tablet 1  . Evolocumab (REPATHA SURECLICK) 503 MG/ML SOAJ Inject 140 mg into the skin every 14 (fourteen) days. 2 pen 11  . FLOVENT HFA 110 MCG/ACT inhaler Inhale 2 puffs into the lungs 2 (two) times daily. Reported on 03/22/2015  5  . fluticasone (FLONASE) 50 MCG/ACT nasal spray Place 1 spray into both nostrils 2 (two) times daily.     . folic acid (FOLVITE) 888 MCG tablet Take 400 mcg by mouth daily.     [name] gabapentin (NEURONTIN) 100 MG capsule Take 2 capsules in the morning, 2 capsules in the afternoon and 6 capsules at bedtime  900 capsule 3  . hydrochlorothiazide (HYDRODIURIL) 25 MG tablet TAKE 1 TABLET BY MOUTH EVERY DAY 90 tablet 3  . hydrocortisone 2.5 % cream Apply topically 2 (two) times daily.    [name] ketoconazole (NIZORAL) 2 % cream Apply topically 2 (two) times daily.    [name] losartan (COZAAR) 100 MG tablet TAKE 1 TABLET BY MOUTH EVERY DAY 90 tablet 3  . meloxicam (MOBIC) 7.5 MG tablet Take 1 tablet (7.5 mg total) by mouth daily as needed for pain. 30 tablet 0  . Misc Natural Products (TART CHERRY ADVANCED PO) Take by mouth.    . NON FORMULARY     . omeprazole (PRILOSEC) 20 MG capsule TAKE 1 CAPSULE BY MOUTH EVERY DAY 90 capsule 1  . ondansetron (ZOFRAN ODT) 8 MG disintegrating tablet Take 1 tablet (8 mg total) by mouth every 8 (eight) hours as needed for nausea or vomiting. 20 tablet 0  . polyethylene glycol powder (GLYCOLAX/MIRALAX) powder Take 17 g by mouth daily. 255 g 0  . Probiotic Product (ALIGN) 4 MG CAPS Take 4 mg by mouth daily.     . simvastatin (ZOCOR) 20 MG tablet TAKE 1 TABLET BY MOUTH EVERYDAY AT BEDTIME 90 tablet 3  . VITAMIN D PO Take by mouth. 4000 iu daily     No current facility-administered medications for this visit.      Objective:  BP 104/60 (BP Location: Left Arm, Patient Position: Sitting, Cuff Size: Normal)   Pulse 87   Temp 97.9 F (36.6 C) (Oral)   Ht 5\' 6"  (1.676 m)   Wt 211 lb 12.8 oz (96.1 kg)   SpO2 95%   BMI 34.19 kg/m  Gen: NAD, resting comfortably CV: RRR no murmurs rubs or gallops Lungs: CTAB no crackles, wheeze, rhonchi Abdomen: soft/nontender/nondistended/normal bowel sounds.  Ext: no edema Skin: warm, dry MSK: some pain over low back    Assessment and Plan   #Concerned about testosterone S:was low with Dr. Tamala Julian  Lab Results  Component Value Date   TESTOSTERONE 240.87 (L) 10/27/2018  A/P: check fasting between 8-9 am .  Does have CAD so want to be very cautious about replacement-also with prostate cancer history so may want urology involved in decision  to treat  #hyperlipidemia S: Well controlled on Simvastatin 20 mg daily and Evolocumab every other week. Also taking Aspirin 81 mg. Tolerating well.  Lab Results  Component Value Date   CHOL 95 01/27/2018   HDL 49 01/27/2018   LDLCALC 16 01/27/2018   LDLDIRECT 115.0 06/04/2016   TRIG 149 01/27/2018   CHOLHDL 1.9 01/27/2018   A/P: Doing well last check- slightly early for updated lipid panel- continue current medication.- will go ahead and update lipids as seeing  cardiology soon   # Hyperglyemia S: Up to this point has been controlled with diet and exercise without progression to diabetes. CBGs- Not checking at home.  Exercise and diet- Walking and stationary bike when able. Has not been doing well with diet, can't eat veggies or fish.  Weight is down 2 pounds from last visit Lab Results  Component Value Date   HGBA1C 5.7 05/05/2018   HGBA1C 5.8 (H) 10/01/2017   HGBA1C 5.6 04/06/2012   A/P: will update a1c- has lost 2 lbs- hopefully at least stable  #hypertension S: controlled on Losartan 100 mg daily and HCTZ 25 mg daily. Not checking at home. Reports 1 episode of dizziness.  BP Readings from Last 3 Encounters:  11/02/18 104/60  10/27/18 120/84  10/22/18 139/83  A/P: Stable. Continue current medications.   # CKD stage III S: On irbesartan in case proteinuric element. A/P: interestingly last GFR over 60- will continue to trend today- usually in 40s or 450s   #Vitamin D deficiency- slightly low at 27- taking vitamin D now 4000 IU   % Neuropathy- Taking Gabapentin 100 mg 2 qAM, 2 in the afternoon, and 6 at bedtime.    # Depression S:Taking Diazepam prn at betime. Prefers not to take antidepressant- felt too tired and sleepy on meds A/P: reasonable control with phq9 of 6 today- counseling provided today- wants to remain off meds  #Accidental fall- August 1-treated with Dermabond after hitting corner nightstand with his head  # has tried jock strap per Dr. Hassell Done- plan was 7  week follow up- urology thought it was an inguinal hernia.   Recommended follow up:  Physical planned in November- depending on labs today may not have to repeat many Future Appointments  Date Time Provider [place]  11/03/2018  8:15 AM [facility]  11/24/2018  9:00 AM [name], MD [facility]  11/29/2018 11:30 AM Lyndal Pulley, DO [facility]  02/06/2019  9:20 AM [name], MD [facility]  03/06/2019 11:30 AM Cameron Sprang, MD [facility] None    Lab/Order associations:   ICD-10-CM   1. Hyperlipidemia, unspecified hyperlipidemia type  E78.5 CBC    Comprehensive metabolic panel    Lipid panel  2. Essential hypertension  I10   3. Depression, major, single episode, in partial remission ([place])  F32.4   4. CKD (chronic kidney disease), stage III ([facility])  N18.3   5. Hyperglycemia  R73.9 Hemoglobin A1c  6. Low testosterone  R79.89 Testos,Total,Free and SHBG (Male)    Return precautions advised.  [name], MD

## 2018-11-02 NOTE — Patient Instructions (Addendum)
Health Maintenance Due  Topic Date Due  . INFLUENZA VACCINE  10/22/2018  We should have flu shots available by September. Please strongly consider getting flu shot this year. If you get your flu shot at a pharmacy- please let us know.   Schedule a lab visit at the check out desk within 2 days between 8-9 AM. Return for future fasting labs meaning nothing but water after midnight please. Ok to take your medications with water.   No changes today

## 2018-11-03 ENCOUNTER — Other Ambulatory Visit (INDEPENDENT_AMBULATORY_CARE_PROVIDER_SITE_OTHER): Payer: PPO

## 2018-11-03 DIAGNOSIS — R739 Hyperglycemia, unspecified: Secondary | ICD-10-CM | POA: Diagnosis not present

## 2018-11-03 DIAGNOSIS — R7989 Other specified abnormal findings of blood chemistry: Secondary | ICD-10-CM | POA: Diagnosis not present

## 2018-11-03 DIAGNOSIS — E785 Hyperlipidemia, unspecified: Secondary | ICD-10-CM | POA: Diagnosis not present

## 2018-11-03 LAB — LIPID PANEL
Cholesterol: 96 mg/dL (ref 0–200)
HDL: 46.2 mg/dL (ref >39.00)
LDL Cholesterol: 18 mg/dL (ref 0–99)
NonHDL: 49.91
Total CHOL/HDL Ratio: 2
Triglycerides: 158 mg/dL — ABNORMAL HIGH (ref 0.0–149.0)
VLDL: 31.6 mg/dL (ref 0.0–40.0)

## 2018-11-03 LAB — CBC
HCT: 44.9 % (ref 39.0–52.0)
Hemoglobin: 15.5 g/dL (ref 13.0–17.0)
MCHC: 34.6 g/dL (ref 30.0–36.0)
MCV: 92.1 fl (ref 78.0–100.0)
Platelets: 248 10*3/uL (ref 150.0–400.0)
RBC: 4.88 Mil/uL (ref 4.22–5.81)
RDW: 12.8 % (ref 11.5–15.5)
WBC: 8.3 10*3/uL (ref 4.0–10.5)

## 2018-11-03 LAB — COMPREHENSIVE METABOLIC PANEL
ALT: 22 U/L (ref 0–53)
AST: 14 U/L (ref 0–37)
Albumin: 4.2 g/dL (ref 3.5–5.2)
Alkaline Phosphatase: 53 U/L (ref 39–117)
BUN: 22 mg/dL (ref 6–23)
CO2: 29 mEq/L (ref 19–32)
Calcium: 9.3 mg/dL (ref 8.4–10.5)
Chloride: 100 mEq/L (ref 96–112)
Creatinine, Ser: 1.29 mg/dL (ref 0.40–1.50)
GFR: 54.19 mL/min — ABNORMAL LOW (ref >60.00)
Glucose, Bld: 108 mg/dL — ABNORMAL HIGH (ref 70–99)
Potassium: 3.8 mEq/L (ref 3.5–5.1)
Sodium: 137 mEq/L (ref 135–145)
Total Bilirubin: 0.9 mg/dL (ref 0.2–1.2)
Total Protein: 6.4 g/dL (ref 6.0–8.3)

## 2018-11-03 LAB — HEMOGLOBIN A1C: Hgb A1c MFr Bld: 5.8 % (ref 4.6–6.5)

## 2018-11-04 ENCOUNTER — Telehealth: Payer: Self-pay | Admitting: Family Medicine

## 2018-11-04 NOTE — Telephone Encounter (Signed)
Spoke to pt see result note 11/04/2018.

## 2018-11-04 NOTE — Telephone Encounter (Signed)
See note  Copied from [place] [id number]. Topic: General - Other >> Nov 04, 2018 12:05 PM Rainey Pines A wrote: Patient calling Tillie Rung back to go over lab results

## 2018-11-07 ENCOUNTER — Ambulatory Visit: Payer: PPO | Admitting: Family Medicine

## 2018-11-07 ENCOUNTER — Telehealth: Payer: Self-pay

## 2018-11-07 NOTE — Telephone Encounter (Signed)
Called and spoke with patient. He worked out this past Friday, Saturday, and Sunday. He is getting upset because his testicles were hurting pretty bad. He has been wearing jock strap as told. This started Feb/March and he has seen Dr. Yong Channel, Urologist, Dermatology, and a surgeon and no one can give him an answer. He has pain even with walking to the bathroom. He has an appointment with the surgeon on 11/17/18 with the same surgeon that he saw before. The surgeon didn't think that the hernia was the problem. Pain does worsen when he takes jock strap off. He is getting to his witt's end because he is continuing to have pain and no one can figure out what is going on. He had u/s done of the testicle and it was normal. He reports having loose stools x 6 months. He has been urinating less since he started wearing the jock strap. Sx started after going for a walk one day. He cannot recall anything abnormal happening during the walk. Denies swelling. The area is tender to palpation. Has not been sexually active since sx started. Hx of chronic LBP and hip pain. Has seen Dr. Paulla Fore and Dr. Tamala Julian for this. Last XR L spine was about 1 year ago with Dr. Paulla Fore. Sx seem to only be present in left testicle, feels like someone is pinching it. No one has mentioned the possibility of sx being related to nerve issue in his back. Sx do seem to be worsening because he has pain now even when he is sitting. He reports difficulty with side lying d/t hip pain but that has calmed down.   Forwarding to Dr. Yong Channel.

## 2018-11-07 NOTE — Telephone Encounter (Signed)
Patient notified of update  and verbalized understanding. Referral placed for Dr. Tamala Julian.

## 2018-11-07 NOTE — Telephone Encounter (Signed)
Please disregard last message about putting the referral in. A referral was placed for Dr. Tamala Julian 10/27/2018. Another referral is not needed!

## 2018-11-07 NOTE — Telephone Encounter (Signed)
Copied from [place] [id number]. Topic: General - Other >> Nov 07, 2018 11:40 AM Pauline Good wrote: Reason for CRM: pt need call back from nurse concerning pain in his testicles

## 2018-11-07 NOTE — Telephone Encounter (Signed)
Brandy-thanks for the thorough update  I am sure this is very frustrating for Joshua [name].  Unfortunately I do not have a good answer for him.  I do think this could be nerve irritation in line with prior chronic penile pain.  There also is a possibility that this pain could be coming from his back- I think it is reasonable to have him follow-up with Dr. Charlann Boxer of sports medicine again if the surgeon continues to believe this is not a surgical issue (at least from general surgery perspective, could be orthopedic surgical issue).  Would be reasonable to go ahead and place referral to Dr. Tamala Julian- please tell patient I am sorry finding answers it is taking so long.  I greatly appreciate his efforts to exercise

## 2018-11-08 DIAGNOSIS — J3081 Allergic rhinitis due to animal (cat) (dog) hair and dander: Secondary | ICD-10-CM | POA: Diagnosis not present

## 2018-11-08 DIAGNOSIS — J301 Allergic rhinitis due to pollen: Secondary | ICD-10-CM | POA: Diagnosis not present

## 2018-11-08 DIAGNOSIS — J3089 Other allergic rhinitis: Secondary | ICD-10-CM | POA: Diagnosis not present

## 2018-11-09 ENCOUNTER — Encounter: Payer: Self-pay | Admitting: Family Medicine

## 2018-11-09 LAB — TESTOS,TOTAL,FREE AND SHBG (FEMALE)
Free Testosterone: 39.6 pg/mL (ref 30.0–135.0)
Sex Hormone Binding: 47 nmol/L (ref 22–77)
Testosterone, Total, LC-MS-MS: 330 ng/dL (ref 250–1100)

## 2018-11-10 ENCOUNTER — Ambulatory Visit (INDEPENDENT_AMBULATORY_CARE_PROVIDER_SITE_OTHER): Payer: PPO | Admitting: Family Medicine

## 2018-11-10 ENCOUNTER — Encounter: Payer: Self-pay | Admitting: Family Medicine

## 2018-11-10 ENCOUNTER — Other Ambulatory Visit: Payer: Self-pay

## 2018-11-10 VITALS — BP 122/72 | HR 80 | Ht 66.0 in

## 2018-11-10 DIAGNOSIS — M5136 Other intervertebral disc degeneration, lumbar region: Secondary | ICD-10-CM

## 2018-11-10 DIAGNOSIS — R29898 Other symptoms and signs involving the musculoskeletal system: Secondary | ICD-10-CM | POA: Diagnosis not present

## 2018-11-10 DIAGNOSIS — M5416 Radiculopathy, lumbar region: Secondary | ICD-10-CM | POA: Diagnosis not present

## 2018-11-10 MED ORDER — AMOXICILLIN-POT CLAVULANATE 875-125 MG PO TABS: 1.0000 | ORAL_TABLET | Freq: Two times a day (BID) | ORAL | 0 refills | Status: DC

## 2018-11-10 NOTE — Progress Notes (Signed)
[name] [place], [place] [address] Phone: [phone] Subjective:   Joshua [name], am serving as a scribe for Dr. Hulan Saas.  I'm seeing this patient by the request  of:    CC: Back and hip pain follow-up  [id number]   10/27/2018 Patient given injection.  Tolerated the procedure well.  Discussed icing regimen and home exercise.  Discussed which activities to do which was to avoid.  Patient is to increase activity slowly over the course of next several weeks.  Follow-up again in 4 to 8 weeks I do believe that the lumbar radiculopathy likely is contributing to some and we discussed over-the-counter medicines as well.  Update 11/10/2018 Joshua [name] is a 78 y.o. male coming in with complaint of left hip pain. Hard time climbing steps and feels weak. Pain is in left glute. Had 2 days of relief.   Patient also notes that since February he has been having left testicle pain. Has been wearing jock strap. Was riding stationary bike and had pain after activity in the testicle. Was seeing another provider as skin covering testicle was "burned" and he had to start using a cream which then caused a fungal infection.     Past Medical History:  Diagnosis Date   Acute medial meniscal tear    Anemia    Arthritis    "middle finger right hand; right knee; neck" (02/06/2014)   Asthma    Bladder cancer ([place]) 04/2010   "cauterized during prostate OR"   CAD (coronary artery disease)    CABG 2001   Depression    Diverticulosis    DJD (degenerative joint disease)    GERD (gastroesophageal reflux disease)    History of gout    Hyperlipidemia    Hypertension    IBS (irritable bowel syndrome)    Obesity    OSA (obstructive sleep apnea)    Pancreatitis ~ 1980   Prostate cancer ([place]) 04/2010   Past Surgical History:  Procedure Laterality Date   ANTERIOR CERVICAL DECOMP/DISCECTOMY FUSION  05/26/06   BACK SURGERY      CARDIAC CATHETERIZATION  11/20/1999   CORONARY ARTERY BYPASS GRAFT  11/20/1999   SVG-RI1-RI2, SVG-OM, SVG-dRCA   KNEE ARTHROSCOPY Right 1992; 04/2008   LAPAROSCOPIC CHOLECYSTECTOMY  2007   NASAL SEPTUM SURGERY Right 2007   ROBOT ASSISTED LAPAROSCOPIC RADICAL PROSTATECTOMY  04/2010   THYROIDECTOMY, PARTIAL  1980   Social History   Socioeconomic History   Marital status: Married    Spouse name: Not on file   Number of children: Not on file   Years of education: Not on file   Highest education level: Not on file  Occupational History   Occupation: retired    Fish farm manager: RETIRED  Social Designer, fashion/clothing strain: Not on file   Food insecurity    Worry: Not on file    Inability: Not on file   Transportation needs    Medical: Not on file    Non-medical: Not on file  Tobacco Use   Smoking status: Former Smoker    Packs/day: 1.00    Years: 35.00    Pack years: 35.00    Types: Cigarettes    Quit date: 06/16/1992    Years since quitting: 26.4   Smokeless tobacco: Never Used   Tobacco comment: discussed the AAA but thinks he may have had this   Substance and Sexual Activity   Alcohol use: Yes    Alcohol/week:  7.0 standard drinks    Types: 7 Shots of liquor per week    Comment: mixed drink   Drug use: [name]   Sexual activity: Not on file  Lifestyle   Physical activity    Days per week: Not on file    Minutes per session: Not on file   Stress: Not on file  Relationships   Social connections    Talks on phone: Not on file    Gets together: Not on file    Attends religious service: Not on file    Active member of club or organization: Not on file    Attends meetings of clubs or organizations: Not on file    Relationship status: Not on file  Other Topics Concern   Not on file  Social History Narrative   Married 42 years in 2015. [name] kids (mumps at age 62)      Retired from [employer] in Engineer, technical sales   Volunteers at hospital      Hobbies: poker, movies and  tv, staying active      Right handed   2 level home with steps he uses   Allergies  Allergen Reactions   Benadryl [Diphenhydramine] Other (See Comments)    Pt told not to take because of bypass surgery   Bromfed Nausea And Vomiting   Clarithromycin Other (See Comments)     gastritis   Codeine Other (See Comments)    Trouble breathing   Doxycycline Hyclate Nausea And Vomiting   Modafinil Other (See Comments)     anxiety-nervousness   Oxycodone-Acetaminophen Itching   Promethazine Hcl Other (See Comments)     fainting   Quinolones     Cipro   Telithromycin Nausea And Vomiting   Hydrocodone-Acetaminophen Rash   Sulfonamide Derivatives Rash   Family History  Problem Relation Age of Onset   Heart disease Mother    Hyperlipidemia Mother    Heart disease Father    Hyperlipidemia Father    Diabetes Brother      Current Outpatient Medications (Cardiovascular):    Evolocumab (REPATHA SURECLICK) 215 MG/ML SOAJ, Inject 140 mg into the skin every 14 (fourteen) days.   hydrochlorothiazide (HYDRODIURIL) 25 MG tablet, TAKE 1 TABLET BY MOUTH EVERY DAY   losartan (COZAAR) 100 MG tablet, TAKE 1 TABLET BY MOUTH EVERY DAY   simvastatin (ZOCOR) 20 MG tablet, TAKE 1 TABLET BY MOUTH EVERYDAY AT BEDTIME  Current Outpatient Medications (Respiratory):    albuterol (PROAIR HFA) 108 (90 BASE) MCG/ACT inhaler, Inhale 1-2 puffs into the lungs every 6 (six) hours as needed for wheezing or shortness of breath.   desloratadine (CLARINEX) 5 MG tablet, Take 5 mg by mouth at bedtime.    FLOVENT HFA 110 MCG/ACT inhaler, Inhale 2 puffs into the lungs 2 (two) times daily. Reported on 03/22/2015   fluticasone (FLONASE) 50 MCG/ACT nasal spray, Place 1 spray into both nostrils 2 (two) times daily.   Current Outpatient Medications (Analgesics):    acetaminophen (TYLENOL) 500 MG tablet, Take 1,000 mg by mouth every 6 (six) hours as needed for headache (pain). Reported on 07/02/2015    aspirin EC 81 MG tablet, Take 81 mg by mouth daily.   meloxicam (MOBIC) 7.5 MG tablet, Take 1 tablet (7.5 mg total) by mouth daily as needed for pain.  Current Outpatient Medications (Hematological):    Cyanocobalamin (VITAMIN B 12 PO), Take 800 mg by mouth daily.   folic acid (FOLVITE) 872 MCG tablet, Take 400 mcg by mouth daily.   Current  Outpatient Medications (Other):    clotrimazole-betamethasone (LOTRISONE) cream, USE 0.5 CM TRRANSDERMALLY TWICE A DAY   diazepam (VALIUM) 5 MG tablet, Take 1 tablet (5 mg total) by mouth at bedtime as needed.   gabapentin (NEURONTIN) 100 MG capsule, Take 2 capsules in the morning, 2 capsules in the afternoon and 6 capsules at bedtime   hydrocortisone 2.5 % cream, Apply topically 2 (two) times daily.   ketoconazole (NIZORAL) 2 % cream, Apply topically 2 (two) times daily.   Misc Natural Products (TART CHERRY ADVANCED PO), Take by mouth.   NON FORMULARY,    omeprazole (PRILOSEC) 20 MG capsule, TAKE 1 CAPSULE BY MOUTH EVERY DAY   ondansetron (ZOFRAN ODT) 8 MG disintegrating tablet, Take 1 tablet (8 mg total) by mouth every 8 (eight) hours as needed for nausea or vomiting.   polyethylene glycol powder (GLYCOLAX/MIRALAX) powder, Take 17 g by mouth daily.   Probiotic Product (ALIGN) 4 MG CAPS, Take 4 mg by mouth daily.    VITAMIN D PO, Take by mouth. 4000 iu daily   amoxicillin-clavulanate (AUGMENTIN) 875-125 MG tablet, Take 1 tablet by mouth 2 (two) times daily.    Past medical history, social, surgical and family history all reviewed in electronic medical record.  [name] pertanent information unless stated regarding to the chief complaint.   Review of Systems:  [name] headache, visual changes, nausea, vomiting, diarrhea, constipation, dizziness, abdominal pain, skin rash, fevers, chills, night sweats, weight loss, swollen lymph nodes, body aches, joint swelling,  chest pain, shortness of breath, mood changes.  Positive muscle aches  Objective    Blood pressure 122/72, pulse 80, height 5\' 6"  (1.676 m), SpO2 97 %.    General: [name] apparent distress alert and oriented x3 mood and affect normal, dressed appropriately.  HEENT: Pupils equal, extraocular movements intact  Respiratory: Patient's speak in full sentences and does not appear short of breath  Cardiovascular: [name] lower extremity edema, non tender, [name] erythema  Skin: Warm dry intact with [name] signs of infection or rash on extremities or on axial skeleton.  Abdomen: Soft nontender  Neuro: Cranial nerves II through XII are intact, neurovascularly intact in all extremities with  and 2+ pulses.  Lymph: [name] lymphadenopathy of posterior or anterior cervical chain or axillae bilaterally.  Gait antalgic MSK: tender with limited range of motion and good stability and symmetric strength and tone of shoulders, elbows, wrist, hip, knee and ankles bilaterally.  Arthritic changes of multiple joints Patient back exam has loss of lordosis.  Left hip does have some weakness with Trendelenburg.  Mild positive straight leg test with the radicular symptoms down the left leg in the L5 and S1 distributions on the right side.  Patient is worsening back pain with any extension greater than 5 degrees.  Deep tendon reflexes are 1+ of the lower extremity on the left leg   Impression and Recommendations:     This case required medical decision making of moderate complexity. The above documentation has been reviewed and is accurate and complete Lyndal Pulley, DO       Note: This dictation was prepared with Dragon dictation along with smaller phrase technology. Any transcriptional errors that result from this process are unintentional.

## 2018-11-10 NOTE — Assessment & Plan Note (Signed)
Patient does have degenerative disc disease.  Likely contributing to most of the discomfort and pain.  Discussed with patient about the possibility of an epidural which will be scheduled today.  Discussed the medications at great length.  Concerned with the intermittent urinary incontinence and bowel incontinence.  Worsening symptoms and especially if any leg weakness needs to seek medical attention immediately.  Fibroids follow-up 2 to 3 weeks after epidural.

## 2018-11-10 NOTE — Patient Instructions (Signed)
[facility] [phone] to schedule epidural Augmentin 1 pill, twice a day for 10 days See me 2-3 weeks following epidural

## 2018-11-15 ENCOUNTER — Ambulatory Visit
Admission: RE | Admit: 2018-11-15 | Discharge: 2018-11-15 | Disposition: A | Discharge status: Home / Self Care | Payer: PPO | Source: Ambulatory Visit | Attending: Family Medicine | Admitting: Family Medicine

## 2018-11-15 ENCOUNTER — Other Ambulatory Visit: Payer: Self-pay

## 2018-11-15 DIAGNOSIS — M5416 Radiculopathy, lumbar region: Secondary | ICD-10-CM

## 2018-11-15 DIAGNOSIS — M4807 Spinal stenosis, lumbosacral region: Secondary | ICD-10-CM | POA: Diagnosis not present

## 2018-11-15 MED ORDER — METHYLPREDNISOLONE ACETATE 40 MG/ML INJ SUSP (RADIOLOG
120.0000 mg | Freq: Once | INTRAMUSCULAR | Status: AC
  Administered 2018-11-15: 120 mg via EPIDURAL

## 2018-11-15 MED ORDER — IOPAMIDOL (ISOVUE-M 200) INJECTION 41%
1.0000 mL | Freq: Once | INTRAMUSCULAR | Status: AC
  Administered 2018-11-15: 1 mL via EPIDURAL

## 2018-11-15 NOTE — Discharge Instructions (Signed)

## 2018-11-22 DIAGNOSIS — J3081 Allergic rhinitis due to animal (cat) (dog) hair and dander: Secondary | ICD-10-CM | POA: Diagnosis not present

## 2018-11-22 DIAGNOSIS — J301 Allergic rhinitis due to pollen: Secondary | ICD-10-CM | POA: Diagnosis not present

## 2018-11-22 DIAGNOSIS — J3089 Other allergic rhinitis: Secondary | ICD-10-CM | POA: Diagnosis not present

## 2018-11-22 NOTE — Progress Notes (Signed)
HPI  The patient returns for follow up of CAD.  He had a negative perfusion study in 2015.  When I last saw him in Jan of this year he was having chest pain.   He had a POET (Plain Old Exercise Treadmill) and he had no ischemia.    Since I last saw him he has had no new cardiovascular complaints. The patient denies any new symptoms such as chest discomfort, neck or arm discomfort. There has been no new shortness of breath, PND or orthopnea. There have been no reported palpitations, presyncope or syncope.  He had back pain with an epidural.  However, his biggest issue is testicular pain and he thinks he has a hernia.  He is being evaluated for this.  This is very limiting.  He also has bursitis.    Allergies  Allergen Reactions  . 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 Bromfed Nausea And Vomiting  . Clarithromycin Other (See Comments)     gastritis  . Codeine Other (See Comments)    Trouble breathing  . Doxycycline Hyclate Nausea And Vomiting  . Modafinil Other (See Comments)     anxiety-nervousness  . Oxycodone-Acetaminophen Itching  . Promethazine Hcl Other (See Comments)     fainting  . Quinolones     Cipro  . Telithromycin Nausea And Vomiting  . Hydrocodone-Acetaminophen Rash  . Sulfonamide Derivatives Rash    Current Outpatient Medications  Medication Sig Dispense Refill  . acetaminophen (TYLENOL) 500 MG tablet Take 1,000 mg by mouth every 6 (six) hours as needed for headache (pain). Reported on 07/02/2015    . albuterol (PROAIR HFA) 108 (90 BASE) MCG/ACT inhaler Inhale 1-2 puffs into the lungs every 6 (six) hours as needed for wheezing or shortness of breath. 18 g 3  . aspirin EC 81 MG tablet Take 81 mg by mouth daily.    . clotrimazole-betamethasone (LOTRISONE) cream USE 0.5 CM TRRANSDERMALLY TWICE A DAY  6  . Cyanocobalamin (VITAMIN B 12 PO) Take 800 mg by mouth daily.    [name] desloratadine (CLARINEX) 5 MG  tablet Take 5 mg by mouth at bedtime.     . diazepam (VALIUM) 5 MG tablet Take 1 tablet (5 mg total) by mouth at bedtime as needed. 30 tablet 1  . Evolocumab (REPATHA SURECLICK) XX123456 MG/ML SOAJ Inject 140 mg into the skin every 14 (fourteen) days. 2 pen 11  . FLOVENT HFA 110 MCG/ACT inhaler Inhale 2 puffs into the lungs 2 (two) times daily. Reported on 03/22/2015  5  . fluticasone (FLONASE) 50 MCG/ACT nasal spray Place 1 spray into both nostrils 2 (two) times daily.     . folic acid (FOLVITE) A999333 MCG tablet Take 400 mcg by mouth daily.     [name] gabapentin (NEURONTIN) 100 MG capsule Take 2 capsules in the morning, 2 capsules in the afternoon and 6 capsules at bedtime 900 capsule 3  . hydrochlorothiazide (HYDRODIURIL) 25 MG tablet TAKE 1 TABLET BY MOUTH EVERY DAY 90 tablet 3  . ketoconazole (NIZORAL) 2 % cream Apply topically 2 (two) times daily.    [name] losartan (COZAAR) 100 MG tablet TAKE 1 TABLET BY MOUTH EVERY DAY 90 tablet 3  . meloxicam (MOBIC) 7.5 MG tablet Take 1 tablet (7.5 mg total) by mouth daily as needed for pain. 30 tablet 0  . Misc Natural Products (TART CHERRY ADVANCED PO) Take by mouth.    [name]  omeprazole (PRILOSEC) 20 MG capsule TAKE 1 CAPSULE BY MOUTH EVERY DAY 90 capsule 1  . ondansetron (ZOFRAN ODT) 8 MG disintegrating tablet Take 1 tablet (8 mg total) by mouth every 8 (eight) hours as needed for nausea or vomiting. 20 tablet 0  . polyethylene glycol powder (GLYCOLAX/MIRALAX) powder Take 17 g by mouth daily. 255 g 0  . Probiotic Product (ALIGN) 4 MG CAPS Take 4 mg by mouth daily.     . simvastatin (ZOCOR) 20 MG tablet TAKE 1 TABLET BY MOUTH EVERYDAY AT BEDTIME 90 tablet 3  . VITAMIN D PO Take by mouth. 4000 iu daily     No current facility-administered medications for this visit.     Past Medical History:  Diagnosis Date  . Acute medial meniscal tear   . Anemia   . Arthritis    "middle finger right hand; right knee; neck" (02/06/2014)  . Asthma   . Bladder cancer ([place]) 04/2010    "cauterized during prostate OR"  . CAD (coronary artery disease)    CABG 2001  . Depression   . Diverticulosis   . DJD (degenerative joint disease)   . GERD (gastroesophageal reflux disease)   . History of gout   . Hyperlipidemia   . Hypertension   . IBS (irritable bowel syndrome)   . Obesity   . OSA (obstructive sleep apnea)   . Pancreatitis ~ 1980  . Prostate cancer ([place]) 04/2010    Past Surgical History:  Procedure Laterality Date  . ANTERIOR CERVICAL DECOMP/DISCECTOMY FUSION  05/26/06  . BACK SURGERY    . CARDIAC CATHETERIZATION  11/20/1999  . CORONARY ARTERY BYPASS GRAFT  11/20/1999   SVG-RI1-RI2, SVG-OM, SVG-dRCA  . KNEE ARTHROSCOPY Right 1992; 04/2008  . LAPAROSCOPIC CHOLECYSTECTOMY  2007  . NASAL SEPTUM SURGERY Right 2007  . ROBOT ASSISTED LAPAROSCOPIC RADICAL PROSTATECTOMY  04/2010  . THYROIDECTOMY, PARTIAL  1980    ROS:  As stated in the HPI and negative for all other systems.   PHYSICAL EXAM Pulse 97   Ht 5\' 6"  (1.676 m)   Wt 210 lb (95.3 kg)   BMI 33.89 kg/m   GENERAL:  Well appearing NECK:  No jugular venous distention, waveform within normal limits, carotid upstroke brisk and symmetric, no bruits, no thyromegaly LUNGS:  Clear to auscultation bilaterally CHEST:  Well healed sternotomy scar. HEART:  PMI not displaced or sustained,S1 and S2 within normal limits, no S3, no S4, no clicks, no rubs, no murmurs ABD:  Flat, positive bowel sounds normal in frequency in pitch, no bruits, no rebound, no guarding, no midline pulsatile mass, no hepatomegaly, no splenomegaly EXT:  2 plus pulses throughout, no edema, no cyanosis no clubbing    EKG:  NA  Lab Results  Component Value Date   CHOL 96 11/03/2018   TRIG 158.0 (H) 11/03/2018   HDL 46.20 11/03/2018   LDLCALC 18 11/03/2018   LDLDIRECT 115.0 06/04/2016    ASSESSMENT AND PLAN   CORONARY ARTERY DISEASE -  The patient has no new sypmtoms.  No further cardiovascular testing is indicated.  We will continue  with aggressive risk reduction and meds as listed.  HTN - The blood pressure is mildly elevated but this is unusual.  Did go up particularly high with exercise treadmill.  No change in therapy.  He will keep an eye on it.   DYSLIPIDEMIA - He is now on Repatha.   LDL is fantastic as above.   OVERWEIGHT - This now becomes a little bit  more of an issue since he is not able to exercise.  We talked about this at length in the past. d good diet and as much activity as he can tolerate.

## 2018-11-24 ENCOUNTER — Ambulatory Visit (INDEPENDENT_AMBULATORY_CARE_PROVIDER_SITE_OTHER): Payer: PPO | Admitting: Cardiology

## 2018-11-24 ENCOUNTER — Other Ambulatory Visit: Payer: Self-pay

## 2018-11-24 ENCOUNTER — Ambulatory Visit: Payer: PPO | Admitting: Family Medicine

## 2018-11-24 ENCOUNTER — Ambulatory Visit (INDEPENDENT_AMBULATORY_CARE_PROVIDER_SITE_OTHER): Payer: PPO | Admitting: Family Medicine

## 2018-11-24 ENCOUNTER — Encounter: Payer: Self-pay | Admitting: Cardiology

## 2018-11-24 ENCOUNTER — Encounter: Payer: Self-pay | Admitting: Family Medicine

## 2018-11-24 VITALS — BP 148/88 | HR 97 | Ht 66.0 in | Wt 210.0 lb

## 2018-11-24 VITALS — BP 148/88 | HR 97 | Temp 97.2°F | Ht 66.0 in | Wt 210.0 lb

## 2018-11-24 DIAGNOSIS — I2510 Atherosclerotic heart disease of native coronary artery without angina pectoris: Secondary | ICD-10-CM

## 2018-11-24 DIAGNOSIS — I10 Essential (primary) hypertension: Secondary | ICD-10-CM | POA: Diagnosis not present

## 2018-11-24 DIAGNOSIS — E785 Hyperlipidemia, unspecified: Secondary | ICD-10-CM

## 2018-11-24 DIAGNOSIS — B370 Candidal stomatitis: Secondary | ICD-10-CM | POA: Diagnosis not present

## 2018-11-24 MED ORDER — NYSTATIN 100000 UNIT/ML MT SUSP: 5.0000 mL | Freq: Four times a day (QID) | OROMUCOSAL | 0 refills | Status: DC

## 2018-11-24 NOTE — Patient Instructions (Signed)
Oral Thrush, Adult  Oral thrush is an infection in your mouth and throat. It causes white patches on your tongue and in your mouth. Follow these instructions at home: Helping with soreness   To lessen your pain: ? Drink cold liquids, like water and iced tea. ? Eat frozen ice pops or frozen juices. ? Eat foods that are easy to swallow, like gelatin and ice cream. ? Drink from a straw if the patches in your mouth are painful. General instructions  Take or use over-the-counter and prescription medicines only as told by your doctor. Medicine for oral thrush may be something to swallow, or it may be something to put on the infected area.  Eat plain yogurt that has live cultures in it. Read the label to make sure.  If you wear dentures: ? Take out your dentures before you go to bed. ? Brush them well. ? Soak them in a denture cleaner.  Rinse your mouth with warm salt-water many times a day. To make the salt-water mixture, completely dissolve 1/2-1 teaspoon of salt in 1 cup of warm water. Contact a doctor if:  Your problems are getting worse.  Your problems do not get better in less than 7 days with treatment.  Your infection is spreading. This may show as white patches on the skin outside of your mouth.  You are nursing your baby and you have redness and pain in the nipples. This information is not intended to replace advice given to you by your health care provider. Make sure you discuss any questions you have with your health care provider. Document Released: 06/03/2009 Document Revised: 06/11/2017 Document Reviewed: 12/02/2015 Elsevier Patient Education  2020 Elsevier Inc.  

## 2018-11-24 NOTE — Progress Notes (Signed)
Patient: Joshua [name] MRN: [id number] DOB: 1940-03-24 PCP: Joshua Olp, MD     I connected with Joshua [name] on 11/24/18 at 10:43am by a video enabled telemedicine application and verified that I am speaking with the correct person using two identifiers.  Location patient: Home Location provider: [facility], Office Persons participating in this virtual visit: Joshua [name] and Joshua [name]   I discussed the limitations of evaluation and management by telemedicine and the availability of in person appointments. The patient expressed understanding and agreed to proceed.   Subjective:  Chief Complaint  Patient presents with  . Sore Throat    HPI: The patient is a 78 y.o. male who presents today for sore throat. He started to have a sore throat on Tuesday AM. He was just finishing up with amoxicillin and noticed white spots in his throat. He thought it was thrush and used Listerine and proxyo (over the counter). It got better and then yesterday it started hurting again. Today the pain was so bad it woke him up. Still has some white spots. No fever/chills. No swollen glands. No exposure to strep or covid. Has no other symptoms but a sore throat.   Review of Systems  Constitutional: Negative for chills, fatigue and fever.  HENT: Positive for sore throat. Negative for congestion, postnasal drip and rhinorrhea.   Respiratory: Negative for cough.   Cardiovascular: Negative for chest pain, palpitations and leg swelling.  Gastrointestinal: Negative for abdominal pain, diarrhea, nausea and vomiting.  Musculoskeletal: Negative for neck pain and neck stiffness.  Neurological: Negative for dizziness and headaches.    Allergies Patient is allergic to benadryl [diphenhydramine]; bromfed; clarithromycin; codeine; doxycycline hyclate; modafinil; oxycodone-acetaminophen; promethazine hcl; quinolones; telithromycin; hydrocodone-acetaminophen; and sulfonamide derivatives.  Past Medical  History Patient  has a past medical history of Acute medial meniscal tear, Anemia, Arthritis, Asthma, Bladder cancer ([place]) (04/2010), CAD (coronary artery disease), Depression, Diverticulosis, DJD (degenerative joint disease), GERD (gastroesophageal reflux disease), History of gout, Hyperlipidemia, Hypertension, IBS (irritable bowel syndrome), Obesity, OSA (obstructive sleep apnea), Pancreatitis (~ 1980), and Prostate cancer ([place]) (04/2010).  Surgical History Patient  has a past surgical history that includes Thyroidectomy, partial (1980); Anterior cervical decomp/discectomy fusion (05/26/06); Knee arthroscopy (Right, 1992; 04/2008); Robot assisted laparoscopic radical prostatectomy (04/2010); Nasal septum surgery (Right, 2007); Back surgery; Laparoscopic cholecystectomy (2007); Cardiac catheterization (11/20/1999); and Coronary artery bypass graft (11/20/1999).  Family History Pateint's family history includes Diabetes in his brother; Heart disease in his father and mother; Hyperlipidemia in his father and mother.  Social History Patient  reports that he quit smoking about 26 years ago. His smoking use included cigarettes. He has a 35.00 pack-year smoking history. He has never used smokeless tobacco. He reports current alcohol use of about 7.0 standard drinks of alcohol per week. He reports that he does not use drugs.    Objective: Vitals:   11/24/18 1017  BP: (!) 148/88  Pulse: 97  Temp: (!) 97.2 F (36.2 C)  TempSrc: Skin  Weight: 210 lb (95.3 kg)  Height: 5\' 6"  (1.676 m)    Body mass index is 33.89 kg/m.  Physical Exam Vitals signs reviewed.  Constitutional:      Appearance: He is well-developed.  HENT:     Head: Normocephalic and atraumatic.     Mouth/Throat:     Mouth: Mucous membranes are moist. Oral lesions (white spots on roof of mouth ) present.  Pulmonary:     Effort: Pulmonary effort is normal.  Neurological:  General: No focal deficit present.     Mental Status: He is  alert and oriented to person, place, and time.        Assessment/plan: 1. Oral candida Nystatin swish and swallow. No concern for strep or covid off history and exam today via telehealth. If worsening symptoms despite treatment he is to let us know.       Return if symptoms worsen or fail to improve.   Joshua Flaming, MD [place]  11/24/2018

## 2018-11-24 NOTE — Patient Instructions (Signed)
Medication Instructions:  Your physician recommends that you continue on your current medications as directed. Please refer to the Current Medication list given to you today.  If you need a refill on your cardiac medications before your next appointment, please call your pharmacy.   Lab work: NONE If you have labs (blood work) drawn today and your tests are completely normal, you will receive your results only by: [place] (if you have MyChart) OR A paper copy in the mail If you have any lab test that is abnormal or we need to change your treatment, we will call you to review the results.  Testing/Procedures: NONE  Follow-Up: At [facility], you and your health needs are our priority.  As part of our continuing mission to provide you with exceptional heart care, we have created designated Provider Care Teams.  These Care Teams include your primary Cardiologist (physician) and Advanced Practice Providers (APPs -  Physician Assistants and Nurse Practitioners) who all work together to provide you with the care you need, when you need it. You will need a follow up appointment in 12 months.  Please call our office 2 months in advance to schedule this appointment.  You may see [name], MD or one of the following Advanced Practice Providers on your designated Care Team:   Rosaria Ferries, PA-C Jory Sims, DNP, ANP

## 2018-11-25 DIAGNOSIS — R1032 Left lower quadrant pain: Secondary | ICD-10-CM | POA: Diagnosis not present

## 2018-11-28 NOTE — Progress Notes (Signed)
[name] [address], [place] [address] Phone: [phone] Subjective:   I [name] am serving as a Education administrator for Dr. Hulan Saas.   CC: Back pain follow-up  [id number]  Joshua [name] is a 78 y.o. male coming in with complaint of back pain. Patient states he feels a lot better. Walking more normal.  Patient has had some discomfort and pain from time to time.  Patient states that since the epidural he is feeling 90% better.  States sometimes doing better in each time he goes walking seems to be doing further without as much discomfort.     Past Medical History:  Diagnosis Date  . Acute medial meniscal tear   . Anemia   . Arthritis    "middle finger right hand; right knee; neck" (02/06/2014)  . Asthma   . Bladder cancer ([place]) 04/2010   "cauterized during prostate OR"  . CAD (coronary artery disease)    CABG 2001  . Depression   . Diverticulosis   . DJD (degenerative joint disease)   . GERD (gastroesophageal reflux disease)   . History of gout   . Hyperlipidemia   . Hypertension   . IBS (irritable bowel syndrome)   . Obesity   . OSA (obstructive sleep apnea)   . Pancreatitis ~ 1980  . Prostate cancer ([place]) 04/2010   Past Surgical History:  Procedure Laterality Date  . ANTERIOR CERVICAL DECOMP/DISCECTOMY FUSION  05/26/06  . BACK SURGERY    . CARDIAC CATHETERIZATION  11/20/1999  . CORONARY ARTERY BYPASS GRAFT  11/20/1999   SVG-RI1-RI2, SVG-OM, SVG-dRCA  . KNEE ARTHROSCOPY Right 1992; 04/2008  . LAPAROSCOPIC CHOLECYSTECTOMY  2007  . NASAL SEPTUM SURGERY Right 2007  . ROBOT ASSISTED LAPAROSCOPIC RADICAL PROSTATECTOMY  04/2010  . THYROIDECTOMY, PARTIAL  1980   Social History   Socioeconomic History  . Marital status: Married    Spouse name: Not on file  . Number of children: Not on file  . Years of education: Not on file  . Highest education level: Not on file  Occupational History  . Occupation: retired    Fish farm manager:  RETIRED  Social Needs  . Financial resource strain: Not on file  . Food insecurity    Worry: Not on file    Inability: Not on file  . Transportation needs    Medical: Not on file    Non-medical: Not on file  Tobacco Use  . Smoking status: Former Smoker    Packs/day: 1.00    Years: 35.00    Pack years: 35.00    Types: Cigarettes    Quit date: 06/16/1992    Years since quitting: 26.4  . Smokeless tobacco: Never Used  . Tobacco comment: discussed the AAA but thinks he may have had this   Substance and Sexual Activity  . Alcohol use: Yes    Alcohol/week: 7.0 standard drinks    Types: 7 Shots of liquor per week    Comment: mixed drink  . Drug use: No  . Sexual activity: Not on file  Lifestyle  . Physical activity    Days per week: Not on file    Minutes per session: Not on file  . Stress: Not on file  Relationships  . Social Herbalist on phone: Not on file    Gets together: Not on file    Attends religious service: Not on file    Active member of club or organization: Not on  file    Attends meetings of clubs or organizations: Not on file    Relationship status: Not on file  Other Topics Concern  . Not on file  Social History Narrative   Married 42 years in 2015. No kids (mumps at age 37)      Retired from [employer] in [place] at [place]: poker, movies and tv, staying active      Right handed   2 level home with steps he uses   Allergies  Allergen Reactions  . 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 Bromfed Nausea And Vomiting  . Clarithromycin Other (See Comments)     gastritis  . Codeine Other (See Comments)    Trouble breathing  . Doxycycline Hyclate Nausea And Vomiting  . Modafinil Other (See Comments)     anxiety-nervousness  . Oxycodone-Acetaminophen Itching  . Promethazine Hcl Other (See Comments)     fainting  . Quinolones     Cipro  . Telithromycin  Nausea And Vomiting  . Hydrocodone-Acetaminophen Rash  . Sulfonamide Derivatives Rash   Family History  Problem Relation Age of Onset  . Heart disease Mother   . Hyperlipidemia Mother   . Heart disease Father   . Hyperlipidemia Father   . Diabetes Brother      Current Outpatient Medications (Cardiovascular):  [name]  Evolocumab (REPATHA SURECLICK) [id number] MG/ML SOAJ, Inject 140 mg into the skin every 14 (fourteen) days. .  hydrochlorothiazide (HYDRODIURIL) 25 MG tablet, TAKE 1 TABLET BY MOUTH EVERY DAY .  losartan (COZAAR) 100 MG tablet, TAKE 1 TABLET BY MOUTH EVERY DAY .  simvastatin (ZOCOR) 20 MG tablet, TAKE 1 TABLET BY MOUTH EVERYDAY AT BEDTIME  Current Outpatient Medications (Respiratory):  .  albuterol (PROAIR HFA) 108 (90 BASE) MCG/ACT inhaler, Inhale 1-2 puffs into the lungs every 6 (six) hours as needed for wheezing or shortness of breath. .  desloratadine (CLARINEX) 5 MG tablet, Take 5 mg by mouth at bedtime.  [name]  FLOVENT HFA 110 MCG/ACT inhaler, Inhale 2 puffs into the lungs 2 (two) times daily. Reported on 03/22/2015 .  fluticasone (FLONASE) 50 MCG/ACT nasal spray, Place 1 spray into both nostrils 2 (two) times daily.   Current Outpatient Medications (Analgesics):  .  acetaminophen (TYLENOL) 500 MG tablet, Take 1,000 mg by mouth every 6 (six) hours as needed for headache (pain). Reported on 07/02/2015 .  aspirin EC 81 MG tablet, Take 81 mg by mouth daily. .  meloxicam (MOBIC) 7.5 MG tablet, Take 1 tablet (7.5 mg total) by mouth daily as needed for pain.  Current Outpatient Medications (Hematological):  [name]  Cyanocobalamin (VITAMIN B 12 PO), Take 800 mg by mouth daily. .  folic acid (FOLVITE) A999333 MCG tablet, Take 400 mcg by mouth daily.   Current Outpatient Medications (Other):  .  clotrimazole-betamethasone (LOTRISONE) cream, USE 0.5 CM TRRANSDERMALLY TWICE A DAY .  diazepam (VALIUM) 5 MG tablet, Take 1 tablet (5 mg total) by mouth at bedtime as needed. .  gabapentin (NEURONTIN)  100 MG capsule, Take 2 capsules in the morning, 2 capsules in the afternoon and 6 capsules at bedtime .  ketoconazole (NIZORAL) 2 % cream, Apply topically 2 (two) times daily. .  Misc Natural Products (TART CHERRY ADVANCED PO), Take by mouth. .  nystatin (MYCOSTATIN) 100000 UNIT/ML suspension, Take 5 mLs (500,000 Units total) by mouth 4 (four) times daily. [name]  omeprazole (PRILOSEC) 20 MG capsule, TAKE 1 CAPSULE BY MOUTH EVERY DAY .  ondansetron (ZOFRAN ODT) 8 MG disintegrating tablet, Take 1 tablet (8 mg total) by mouth every 8 (eight) hours as needed for nausea or vomiting. .  polyethylene glycol powder (GLYCOLAX/MIRALAX) powder, Take 17 g by mouth daily. .  Probiotic Product (ALIGN) 4 MG CAPS, Take 4 mg by mouth daily.  [name]  VITAMIN D PO, Take by mouth. 4000 iu daily    Past medical history, social, surgical and family history all reviewed in electronic medical record.  No pertanent information unless stated regarding to the chief complaint.   Review of Systems:  No headache, visual changes, nausea, vomiting, diarrhea, constipation, dizziness, abdominal pain, skin rash, fevers, chills, night sweats, weight loss, swollen lymph nodes, body aches, joint swelling, muscle aches, chest pain, shortness of breath, mood changes.   Objective  Blood pressure 130/80, pulse 71, height 5\' 6"  (1.676 m), weight 212 lb (96.2 kg), SpO2 97 %.    General: No apparent distress alert and oriented x3 mood and affect normal, dressed appropriately.  HEENT: Pupils equal, extraocular movements intact  Respiratory: Patient's speak in full sentences and does not appear short of breath  Cardiovascular: Trace lower extremity edema, non tender, no erythema  Skin: Warm dry intact with no signs of infection or rash on extremities or on axial skeleton.  Abdomen: Soft nontender  Neuro: Cranial nerves II through XII are intact, neurovascularly intact in all extremities with 2+ DTRs and 2+ pulses.  Lymph: No lymphadenopathy of  posterior or anterior cervical chain or axillae bilaterally.  Gait mild antalgic MSK:  tender with full range of motion and good stability and symmetric strength and tone of shoulders, elbows, wrist, hip, knee and ankles bilaterally.    Low back exam shows the patient does have some mild loss of lordosis.  Some tightness in the paraspinal musculature lumbar spine.  Patient does have poor core strength.  Lacks last 10 degrees of extension and 5 degrees of flexion.  Patient strength of the lower extremities is 5 out of 5 which is an improvement from previous exam. Impression and Recommendations:      The above documentation has been reviewed and is accurate and complete Lyndal Pulley, DO       Note: This dictation was prepared with Dragon dictation along with smaller phrase technology. Any transcriptional errors that result from this process are unintentional.

## 2018-11-29 ENCOUNTER — Other Ambulatory Visit: Payer: Self-pay

## 2018-11-29 ENCOUNTER — Ambulatory Visit (INDEPENDENT_AMBULATORY_CARE_PROVIDER_SITE_OTHER): Payer: PPO | Admitting: Family Medicine

## 2018-11-29 ENCOUNTER — Encounter: Payer: Self-pay | Admitting: Family Medicine

## 2018-11-29 DIAGNOSIS — G8929 Other chronic pain: Secondary | ICD-10-CM | POA: Diagnosis not present

## 2018-11-29 DIAGNOSIS — M5442 Lumbago with sciatica, left side: Secondary | ICD-10-CM

## 2018-11-29 NOTE — Patient Instructions (Signed)
Good to see you Start the exercises Increase activity  If pain worsens call us and will repeat epidural See me again in 2-3 months

## 2018-11-29 NOTE — Assessment & Plan Note (Signed)
Patient has been doing significantly better after the epidural.  We have made significant progress.  No change in medication, home exercises given medical be more beneficial.  Patient wants to avoid any type of formal physical therapy secondary to coronavirus which I agree with.  Patient should do well and we can repeat epidurals if necessary.  Follow-up again 2 months

## 2018-11-30 DIAGNOSIS — G4733 Obstructive sleep apnea (adult) (pediatric): Secondary | ICD-10-CM | POA: Diagnosis not present

## 2018-12-01 ENCOUNTER — Other Ambulatory Visit: Payer: Self-pay | Admitting: Surgery

## 2018-12-01 DIAGNOSIS — R1032 Left lower quadrant pain: Secondary | ICD-10-CM

## 2018-12-06 DIAGNOSIS — J3081 Allergic rhinitis due to animal (cat) (dog) hair and dander: Secondary | ICD-10-CM | POA: Diagnosis not present

## 2018-12-06 DIAGNOSIS — J301 Allergic rhinitis due to pollen: Secondary | ICD-10-CM | POA: Diagnosis not present

## 2018-12-06 DIAGNOSIS — J3089 Other allergic rhinitis: Secondary | ICD-10-CM | POA: Diagnosis not present

## 2018-12-07 ENCOUNTER — Other Ambulatory Visit: Payer: Self-pay

## 2018-12-07 ENCOUNTER — Ambulatory Visit
Admission: RE | Admit: 2018-12-07 | Discharge: 2018-12-07 | Disposition: A | Discharge status: Home / Self Care | Payer: PPO | Source: Ambulatory Visit | Attending: Surgery | Admitting: Surgery

## 2018-12-07 DIAGNOSIS — R1032 Left lower quadrant pain: Secondary | ICD-10-CM

## 2018-12-07 DIAGNOSIS — K4090 Unilateral inguinal hernia, without obstruction or gangrene, not specified as recurrent: Secondary | ICD-10-CM | POA: Diagnosis not present

## 2018-12-14 DIAGNOSIS — K4090 Unilateral inguinal hernia, without obstruction or gangrene, not specified as recurrent: Secondary | ICD-10-CM | POA: Diagnosis not present

## 2018-12-20 ENCOUNTER — Telehealth: Payer: Self-pay | Admitting: Physical Therapy

## 2018-12-20 DIAGNOSIS — J301 Allergic rhinitis due to pollen: Secondary | ICD-10-CM | POA: Diagnosis not present

## 2018-12-20 DIAGNOSIS — J3089 Other allergic rhinitis: Secondary | ICD-10-CM | POA: Diagnosis not present

## 2018-12-20 DIAGNOSIS — J3081 Allergic rhinitis due to animal (cat) (dog) hair and dander: Secondary | ICD-10-CM | POA: Diagnosis not present

## 2018-12-20 NOTE — Telephone Encounter (Signed)
Called and spoke with pt and he states he got everything resolved and he just wanted to inform Dr. Yong Channel that he  is having an inguinal hernia operation 01/02/2019.

## 2018-12-20 NOTE — Telephone Encounter (Signed)
Copied from [place] [id number]. Topic: General - Other >> Dec 20, 2018  8:48 AM Rainey Pines A wrote: Patient would like a callback from [facility] nurse in regards to an upcoming surgery that he is having trouble getting scheduled.

## 2018-12-21 ENCOUNTER — Telehealth: Payer: Self-pay | Admitting: *Deleted

## 2018-12-21 NOTE — Telephone Encounter (Signed)
Pt left msg stating that his back is hurting again and the last epidural he had in 10/2018 has worn off.

## 2018-12-21 NOTE — Telephone Encounter (Signed)
Patient would like to wait until hernia surgery before he gets another epidural

## 2018-12-22 DIAGNOSIS — Z9889 Other specified postprocedural states: Secondary | ICD-10-CM

## 2018-12-22 DIAGNOSIS — Z8719 Personal history of other diseases of the digestive system: Secondary | ICD-10-CM

## 2018-12-22 HISTORY — DX: Other specified postprocedural states: Z98.890

## 2018-12-22 HISTORY — DX: Personal history of other diseases of the digestive system: Z87.19

## 2018-12-26 ENCOUNTER — Encounter (HOSPITAL_COMMUNITY): Payer: Self-pay

## 2018-12-26 NOTE — Patient Instructions (Addendum)
DUE TO COVID-19 ONLY ONE VISITOR IS ALLOWED TO COME WITH YOU AND STAY IN THE WAITING ROOM ONLY DURING PRE OP AND PROCEDURE DAY OF SURGERY. THE 1 VISITOR MAY VISIT WITH YOU AFTER SURGERY IN YOUR PRIVATE ROOM DURING VISITING HOURS ONLY!  YOU NEED TO HAVE A COVID 19 TEST ON Thursday 12-29-2018 at 1145 AM, THIS TEST MUST BE DONE BEFORE SURGERY, COME  [place], [place]  , [id number].  ([place]) ONCE YOUR COVID TEST IS COMPLETED, PLEASE BEGIN THE QUARANTINE INSTRUCTIONS AS OUTLINED IN YOUR HANDOUT.                Joshua [name]    Your procedure is scheduled on:  01-02-2019  Report to [facility] Main  Entrance   Report to admitting at 730  AM  call dr Hassell Done office about stopping aspirin and meloxicam [phone] phone   Call this number if you have problems the morning of surgery [phone]    Remember: Do not eat food or drink liquids :After Midnight. BRUSH YOUR TEETH MORNING OF SURGERY AND RINSE YOUR MOUTH OUT, NO CHEWING GUM CANDY OR MINTS.     Take these medicines the morning of surgery with A SIP OF WATER: albuterol inhaler if needed and bring inhaler, flovent inhaler, loratadine (claritin), flonase nasal spray, omeprazole (prilosec), gabapentin                                You may not have any metal on your body including hair pins and              piercings  Do not wear jewelry, make-up, lotions, powders or perfumes, deodorant                          Men may shave face and neck.   Do not bring valuables to the hospital. [place].  Contacts, dentures or bridgework may not be worn into surgery.  Leave suitcase in the car. After surgery it may be brought to your room.     Patients discharged the day of surgery will not be allowed to drive home. IF YOU ARE HAVING SURGERY AND GOING HOME THE SAME DAY, YOU MUST HAVE AN ADULT TO DRIVE YOU HOME AND BE WITH YOU FOR 24 HOURS. YOU MAY GO HOME BY  TAXI OR UBER OR ORTHERWISE, BUT AN ADULT MUST ACCOMPANY YOU HOME AND STAY WITH YOU FOR 24 HOURS.  Name and phone number of your driver: June [facility] [phone] CELL [phone]  Special Instructions: N/A              Please read over the following fact sheets you were given: _____________________________________________________________________             [facility] - Preparing for Surgery Before surgery, you can play an important role.  Because skin is not sterile, your skin needs to be as free of germs as possible.  You can reduce the number of germs on your skin by washing with CHG (chlorahexidine gluconate) soap before surgery.  CHG is an antiseptic cleaner which kills germs and bonds with the skin to continue killing germs even after washing. Please DO NOT use if you have an allergy to CHG or antibacterial soaps.  If your skin  becomes reddened/irritated stop using the CHG and inform your nurse when you arrive at Short Stay. Do not shave (including legs and underarms) for at least 48 hours prior to the first CHG shower.  You may shave your face/neck. Please follow these instructions carefully:  1.  Shower with CHG Soap the night before surgery and the  morning of Surgery.  2.  If you choose to wash your hair, wash your hair first as usual with your  normal  shampoo.  3.  After you shampoo, rinse your hair and body thoroughly to remove the  shampoo.                           4.  Use CHG as you would any other liquid soap.  You can apply chg directly  to the skin and wash                       Gently with a scrungie or clean washcloth.  5.  Apply the CHG Soap to your body ONLY FROM THE NECK DOWN.   Do not use on face/ open                           Wound or open sores. Avoid contact with eyes, ears mouth and genitals (private parts).                       Wash face,  Genitals (private parts) with your normal soap.             6.  Wash thoroughly, paying special attention to the area  where your surgery  will be performed.  7.  Thoroughly rinse your body with warm water from the neck down.  8.  DO NOT shower/wash with your normal soap after using and rinsing off  the CHG Soap.                9.  Pat yourself dry with a clean towel.            10.  Wear clean pajamas.            11.  Place clean sheets on your bed the night of your first shower and do not  sleep with pets. Day of Surgery : Do not apply any lotions/deodorants the morning of surgery.  Please wear clean clothes to the hospital/surgery center.  FAILURE TO FOLLOW THESE INSTRUCTIONS MAY RESULT IN THE CANCELLATION OF YOUR SURGERY PATIENT SIGNATURE_________________________________  NURSE SIGNATURE__________________________________  ________________________________________________________________________

## 2018-12-28 ENCOUNTER — Encounter (HOSPITAL_COMMUNITY): Payer: Self-pay

## 2018-12-28 ENCOUNTER — Other Ambulatory Visit: Payer: Self-pay

## 2018-12-28 ENCOUNTER — Encounter (HOSPITAL_COMMUNITY)
Admission: RE | Admit: 2018-12-28 | Discharge: 2018-12-28 | Disposition: A | Discharge status: Home / Self Care | Payer: PPO | Source: Ambulatory Visit | Attending: Surgery | Admitting: Surgery

## 2018-12-28 DIAGNOSIS — G4733 Obstructive sleep apnea (adult) (pediatric): Secondary | ICD-10-CM | POA: Insufficient documentation

## 2018-12-28 DIAGNOSIS — I10 Essential (primary) hypertension: Secondary | ICD-10-CM | POA: Diagnosis not present

## 2018-12-28 DIAGNOSIS — K4090 Unilateral inguinal hernia, without obstruction or gangrene, not specified as recurrent: Secondary | ICD-10-CM | POA: Insufficient documentation

## 2018-12-28 DIAGNOSIS — Z951 Presence of aortocoronary bypass graft: Secondary | ICD-10-CM | POA: Diagnosis not present

## 2018-12-28 DIAGNOSIS — E785 Hyperlipidemia, unspecified: Secondary | ICD-10-CM | POA: Insufficient documentation

## 2018-12-28 DIAGNOSIS — Z01812 Encounter for preprocedural laboratory examination: Secondary | ICD-10-CM | POA: Insufficient documentation

## 2018-12-28 DIAGNOSIS — Z7982 Long term (current) use of aspirin: Secondary | ICD-10-CM | POA: Diagnosis not present

## 2018-12-28 DIAGNOSIS — I2510 Atherosclerotic heart disease of native coronary artery without angina pectoris: Secondary | ICD-10-CM | POA: Diagnosis not present

## 2018-12-28 DIAGNOSIS — Z79899 Other long term (current) drug therapy: Secondary | ICD-10-CM | POA: Insufficient documentation

## 2018-12-28 LAB — BASIC METABOLIC PANEL
Anion gap: 8 (ref 5–15)
BUN: 15 mg/dL (ref 8–23)
CO2: 29 mmol/L (ref 22–32)
Calcium: 9.5 mg/dL (ref 8.9–10.3)
Chloride: 103 mmol/L (ref 98–111)
Creatinine, Ser: 1.12 mg/dL (ref 0.61–1.24)
GFR calc Af Amer: >60 mL/min (ref >60)
GFR calc non Af Amer: >60 mL/min (ref >60)
Glucose, Bld: 126 mg/dL — ABNORMAL HIGH (ref 70–99)
Potassium: 4.1 mmol/L (ref 3.5–5.1)
Sodium: 140 mmol/L (ref 135–145)

## 2018-12-28 LAB — CBC
HCT: 41.9 % (ref 39.0–52.0)
Hemoglobin: 14.3 g/dL (ref 13.0–17.0)
MCH: 32 pg (ref 26.0–34.0)
MCHC: 34.1 g/dL (ref 30.0–36.0)
MCV: 93.7 fL (ref 80.0–100.0)
Platelets: 204 10*3/uL (ref 150–400)
RBC: 4.47 MIL/uL (ref 4.22–5.81)
RDW: 12.5 % (ref 11.5–15.5)
WBC: 6.5 10*3/uL (ref 4.0–10.5)
nRBC: 0 % (ref 0.0–0.2)

## 2018-12-28 NOTE — Progress Notes (Signed)
PCP - dr Remo Lipps hunter Cardiologist - dr Rhae Hammock 11-24-2018 epic  Chest x-ray - 02-16-18 epic EKG - 02-15-18 epic Stress Test - 02-22-14 epic ECHO - 05-20-12 epic Cardiac Cath -  Carotid dulex 07-27-13 epic Sleep Study - 12-22-13 epic CPAP - set on 5   Fasting Blood Sugar - n/a Checks Blood Sugar _____ times a day  :81 mg aspirin, patient to call dr Hassell Done about stopping aspirin and meloxicam Aspirin Instructions: Last Dose:  Anesthesia review: chart to [place] [facility] for review  Patient denies shortness of breath, fever, cough and chest pain at PAT appointment   Patient verbalized understanding of instructions that were given to them at the PAT appointment. Patient was also instructed that they will need to review over the PAT instructions again at home before surgery.

## 2018-12-29 ENCOUNTER — Other Ambulatory Visit (HOSPITAL_COMMUNITY)
Admission: RE | Admit: 2018-12-29 | Discharge: 2018-12-29 | Disposition: A | Discharge status: Home / Self Care | Payer: PPO | Source: Ambulatory Visit | Attending: Surgery | Admitting: Surgery

## 2018-12-29 DIAGNOSIS — Z20828 Contact with and (suspected) exposure to other viral communicable diseases: Secondary | ICD-10-CM | POA: Diagnosis not present

## 2018-12-29 DIAGNOSIS — Z01812 Encounter for preprocedural laboratory examination: Secondary | ICD-10-CM | POA: Insufficient documentation

## 2018-12-29 NOTE — Anesthesia Preprocedure Evaluation (Addendum)
Anesthesia Evaluation  Patient identified by MRN, date of birth, ID band Patient awake    Reviewed: Allergy & Precautions, NPO status , Patient's Chart, lab work & pertinent test results  Airway Mallampati: I       Dental  (+) Teeth Intact, Poor Dentition   Pulmonary asthma , sleep apnea , former smoker,    Pulmonary exam normal        Cardiovascular hypertension, Pt. on medications Normal cardiovascular exam Rhythm:Regular Rate:Normal     Neuro/Psych    GI/Hepatic GERD  Medicated and Controlled,  Endo/Other    Renal/GU      Musculoskeletal   Abdominal (+) + obese,   Peds  Hematology   Anesthesia Other Findings   Reproductive/Obstetrics                            Anesthesia Physical Anesthesia Plan  ASA: II  Anesthesia Plan: General   Post-op Pain Management:    Induction: Intravenous  PONV Risk Score and Plan: 4 or greater and Ondansetron and Dexamethasone  Airway Management Planned: LMA  Additional Equipment: None  Intra-op Plan:   Post-operative Plan: Extubation in OR  Informed Consent: I have reviewed the patients History and Physical, chart, labs and discussed the procedure including the risks, benefits and alternatives for the proposed anesthesia with the patient or authorized representative who has indicated his/her understanding and acceptance.     Dental advisory given  Plan Discussed with: CRNA  Anesthesia Plan Comments: (See PAT note 12/28/2018, Konrad Felix, PA-C)       Anesthesia Quick Evaluation

## 2018-12-29 NOTE — Progress Notes (Signed)
Anesthesia Chart Review   Case: [id number] Date/Time: 01/02/19 0915   Procedure: OPEN LEFT INGUINAL HERNIA REPAIR (Left )   Anesthesia type: General   Pre-op diagnosis: LEFT INGUINAL HERNIA   Location: [place] 01 / [facility] ORS   Surgeon: Johnathan Hausen, MD      DISCUSSION:77 y.o. former smoker (35 pack years, quit 06/16/92) with h/o asthma, HLD, CAD (CABG 2001), GERD, OSA w/CPAP, bladder cancer 2012, left inguinal hernia scheduled for above procedure 01/02/2019 with Dr. Johnathan Hausen.   Pt last seen by cardiologist, Dr. [name], 11/24/2018.  Per OV note, "The patient has no new sypmtoms.  No further cardiovascular testing is indicated.  We will continue with aggressive risk reduction and meds as listed."  Anticipate pt can proceed with planned procedure barring acute status change.   VS: BP (!) 153/70   Pulse 84   Temp 36.7 C (Oral)   Resp 16   Ht 5\' 7"  (1.702 m)   Wt 97.2 kg   SpO2 97%   BMI 33.55 kg/m   PROVIDERS: [name], MD is PCP   [name], MD is Cardiologist  LABS: Labs reviewed: Acceptable for surgery. (all labs ordered are listed, but only abnormal results are displayed)  Labs Reviewed  BASIC METABOLIC PANEL - Abnormal; Notable for the following components:      Result Value   Glucose, Bld 126 (*)    All other components within normal limits  CBC     IMAGES:   EKG: 02/15/2018 Rate 83 bpm Sinus rhythm-frequent PACs Nonspecific T-abnormality   CV: ETT 04/26/2018  No T wave inversion was noted during stress.  There was no ST segment deviation noted during stress.  Blood pressure demonstrated a normal response to exercise.   Overall low risk stress test without ECG changes suggestive of ischemia. Past Medical History:  Diagnosis Date  . Acute medial meniscal tear   . Anemia   . Arthritis    "middle finger right hand; right knee; neck" (02/06/2014)  . Asthma   . Bladder cancer ([place]) 04/2010   "cauterized during prostate OR"  .  CAD (coronary artery disease)    CABG 2001  . Depression   . Diverticulosis   . DJD (degenerative joint disease)    BACK  . GERD (gastroesophageal reflux disease)   . History of gout   . Hyperlipidemia   . Hypertension   . IBS (irritable bowel syndrome)   . Obesity   . OSA (obstructive sleep apnea)    USES CPAP   . Pancreatitis ~ 1980  . Prostate cancer ([place]) 04/2010    Past Surgical History:  Procedure Laterality Date  . ANTERIOR CERVICAL DECOMP/DISCECTOMY FUSION  05/26/2006   PLATE PLACED  . CARDIAC CATHETERIZATION  11/20/1999  . CORONARY ARTERY BYPASS GRAFT  11/20/1999   SVG-RI1-RI2, SVG-OM, SVG-dRCA  . KNEE ARTHROSCOPY Right 1992; 04/2008  . LAPAROSCOPIC CHOLECYSTECTOMY  2007  . NASAL SEPTUM SURGERY Right 2007  . ROBOT ASSISTED LAPAROSCOPIC RADICAL PROSTATECTOMY  04/2010  . THYROIDECTOMY, PARTIAL  1980    MEDICATIONS: . miconazole (MICOTIN) 2 % powder  . acetaminophen (TYLENOL) 500 MG tablet  . albuterol (PROAIR HFA) 108 (90 BASE) MCG/ACT inhaler  . aspirin EC 81 MG tablet  . diazepam (VALIUM) 5 MG tablet  . Evolocumab (REPATHA SURECLICK) [id number] MG/ML SOAJ  . FLOVENT HFA 110 MCG/ACT inhaler  . fluticasone (FLONASE) 50 MCG/ACT nasal spray  . folic acid (FOLVITE) Q000111Q MCG tablet  . gabapentin (NEURONTIN) 100 MG  capsule  . Glucosamine-Chondroitin (MOVE FREE PO)  . hydrochlorothiazide (HYDRODIURIL) 25 MG tablet  . loratadine (CLARITIN) 10 MG tablet  . losartan (COZAAR) 100 MG tablet  . meloxicam (MOBIC) 7.5 MG tablet  . nystatin (MYCOSTATIN) 100000 UNIT/ML suspension  . omeprazole (PRILOSEC) 20 MG capsule  . ondansetron (ZOFRAN ODT) 8 MG disintegrating tablet  . polyethylene glycol powder (GLYCOLAX/MIRALAX) powder  . Probiotic Product (ALIGN) 4 MG CAPS  . simvastatin (ZOCOR) 20 MG tablet  . vitamin B-12 (CYANOCOBALAMIN) 1000 MCG tablet  . VITAMIN D PO   No current facility-administered medications for this encounter.      Maia Plan [facility] Pre-Surgical  Testing [phone] 12/29/18  1:08 PM

## 2018-12-30 LAB — NOVEL CORONAVIRUS, NAA (HOSP ORDER, SEND-OUT TO REF LAB; TAT 18-24 HRS): SARS-CoV-2, NAA: NOT DETECTED

## 2019-01-01 MED ORDER — BUPIVACAINE LIPOSOME 1.3 % IJ SUSP
20.0000 mL | INTRAMUSCULAR | Status: DC
  Filled 2019-01-01: qty 20

## 2019-01-01 NOTE — H&P (Signed)
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  We have discussed management options and he wants to have [facility] repair.   Past Medical History:  Diagnosis Date  . Acute medial meniscal tear   . Anemia   . Arthritis    "middle finger right hand; right knee; neck" (02/06/2014)  . Asthma   . Bladder cancer ([place]) 04/2010   "cauterized during prostate OR"  . CAD (coronary artery disease)    CABG 2001  . Depression   . Diverticulosis   . DJD (degenerative joint disease)    BACK  . GERD (gastroesophageal reflux disease)   . History of gout   . Hyperlipidemia   . Hypertension   . IBS (irritable bowel syndrome)   . Obesity   . OSA (obstructive sleep apnea)    USES CPAP   . Pancreatitis ~ 1980  . Prostate cancer ([place]) 04/2010    Past Surgical History:  Procedure Laterality Date  . ANTERIOR CERVICAL DECOMP/DISCECTOMY FUSION  05/26/2006   PLATE PLACED  . CARDIAC CATHETERIZATION  11/20/1999  . CORONARY ARTERY BYPASS GRAFT  11/20/1999   SVG-RI1-RI2, SVG-OM, SVG-dRCA  . KNEE ARTHROSCOPY Right 1992; 04/2008  . LAPAROSCOPIC CHOLECYSTECTOMY  2007  . NASAL SEPTUM SURGERY Right 2007  . ROBOT ASSISTED LAPAROSCOPIC RADICAL PROSTATECTOMY  04/2010  . THYROIDECTOMY, PARTIAL  1980    No current facility-administered medications for this encounter.    Current Outpatient Medications  Medication Sig Dispense Refill  . acetaminophen (TYLENOL) 500 MG tablet Take 1,000 mg by mouth every 6 (six) hours as needed for moderate pain or headache.     . albuterol (PROAIR HFA) 108 (90 BASE) MCG/ACT inhaler Inhale 1-2 puffs into the lungs every 6 (six) hours as needed for wheezing or shortness of breath. 18 g 3  . aspirin EC 81 MG tablet Take 81 mg by mouth daily.    . diazepam (VALIUM) 5 MG tablet Take 1  tablet (5 mg total) by mouth at bedtime as needed. 30 tablet 1  . Evolocumab (REPATHA SURECLICK) [id number] MG/ML SOAJ Inject 140 mg into the skin every 14 (fourteen) days. 2 pen 11  . FLOVENT HFA 110 MCG/ACT inhaler Inhale 2 puffs into the lungs 2 (two) times daily. Reported on 03/22/2015  5  . fluticasone (FLONASE) 50 MCG/ACT nasal spray Place 1 spray into both nostrils 2 (two) times daily.     . folic acid (FOLVITE) Q000111Q MCG tablet Take 800 mcg by mouth daily.     [name] gabapentin (NEURONTIN) 100 MG capsule Take 2 capsules in the morning, 2 capsules in the afternoon and 6 capsules at bedtime (Patient taking differently: Take 200-600 mg by mouth See admin instructions. Take 200 mg by mouth in the morning,200 mg in the afternoon and 600 mg at bedtime) 900 capsule 3  . Glucosamine-Chondroitin (MOVE FREE PO) Take 1 tablet by mouth daily.    . hydrochlorothiazide (HYDRODIURIL) 25 MG tablet TAKE 1 TABLET BY MOUTH EVERY DAY (Patient taking differently: Take 25 mg by mouth daily. ) 90 tablet 3  . loratadine (CLARITIN) 10 MG tablet Take 10 mg by mouth at bedtime.     [name] losartan (COZAAR) 100 MG tablet TAKE 1 TABLET BY MOUTH EVERY DAY (Patient taking differently: Take 100  mg by mouth at bedtime. ) 90 tablet 3  . meloxicam (MOBIC) 7.5 MG tablet Take 1 tablet (7.5 mg total) by mouth daily as needed for pain. (Patient taking differently: Take 7.5 mg by mouth daily. ) 30 tablet 0  . omeprazole (PRILOSEC) 20 MG capsule TAKE 1 CAPSULE BY MOUTH EVERY DAY (Patient taking differently: Take 20 mg by mouth daily. ) 90 capsule 1  . ondansetron (ZOFRAN ODT) 8 MG disintegrating tablet Take 1 tablet (8 mg total) by mouth every 8 (eight) hours as needed for nausea or vomiting. 20 tablet 0  . polyethylene glycol powder (GLYCOLAX/MIRALAX) powder Take 17 g by mouth daily. (Patient taking differently: Take 17 g by mouth 2 (two) times daily. ) 255 g 0  . Probiotic Product (ALIGN) 4 MG CAPS Take 4 mg by mouth daily.     . simvastatin (ZOCOR)  20 MG tablet TAKE 1 TABLET BY MOUTH EVERYDAY AT BEDTIME (Patient taking differently: Take 20 mg by mouth at bedtime. ) 90 tablet 3  . vitamin B-12 (CYANOCOBALAMIN) 1000 MCG tablet Take 1,000 mcg by mouth daily.    [name] VITAMIN D PO Take 4,000 Units by mouth daily.     . miconazole (MICOTIN) 2 % powder Apply topically as needed for itching. TO GROIN AREA CROTCH ITCH AREA RED NO DRAINAGE    . nystatin (MYCOSTATIN) 100000 UNIT/ML suspension Take 5 mLs (500,000 Units total) by mouth 4 (four) times daily. (Patient not taking: Reported on 12/23/2018) 60 mL 0   Benadryl [diphenhydramine], Bromfed, Clarithromycin, Codeine, Doxycycline hyclate, Modafinil, Oxycodone-acetaminophen, Promethazine hcl, Quinolones, Telithromycin, Hydrocodone-acetaminophen, and Sulfonamide derivatives Family History  Problem Relation Age of Onset  . Heart disease Mother   . Hyperlipidemia Mother   . Heart disease Father   . Hyperlipidemia Father   . Diabetes Brother    Social History:   reports that he quit smoking about 26 years ago. His smoking use included cigarettes. He has a 35.00 pack-year smoking history. He has never used smokeless tobacco. He reports current alcohol use of about 7.0 standard drinks of alcohol per week. He reports that he does not use drugs.   REVIEW OF SYSTEMS : Negative except for see problem list  Physical Exam:   There were no vitals taken for this visit. There is no height or weight on file to calculate BMI.  Gen:  WDWN WM NAD  Neurological: Alert and oriented to person, place, and time. Motor and sensory function is grossly intact  Head: Normocephalic and atraumatic.  Eyes: Conjunctivae are normal. Pupils are equal, round, and reactive to light. No scleral icterus.  Neck: Normal range of motion. Neck supple. No tracheal deviation or thyromegaly present.  Cardiovascular:  SR without murmurs or gallops.  No carotid bruits Breast:  Not examined  Respiratory: Effort normal.  No respiratory  distress. No chest wall tenderness. Breath sounds normal.  No wheezes, rales or rhonchi.  Abdomen:  nontender GU:  Obese inguinal region with faint bulge and pain on the left.   Musculoskeletal: Normal range of motion. Extremities are nontender. No cyanosis, edema or clubbing noted Lymphadenopathy: No cervical, preauricular, postauricular or axillary adenopathy is present Skin: Skin is warm and dry. No rash noted. No diaphoresis. No erythema. No pallor. Pscyh: Normal mood and affect. Behavior is normal. Judgment and thought content normal.   LABORATORY RESULTS: No results found for this or any previous visit (from the past 48 hour(s)).   RADIOLOGY RESULTS: No results found.  Problem List: Patient Active Problem  List   Diagnosis Date Noted  . Greater trochanteric bursitis of left hip 10/27/2018  . Degenerative disc disease, lumbar 10/27/2018  . Abdominal muscle strain, initial encounter 06/16/2018  . Aortic atherosclerosis ([place]) 05/05/2018  . Exertional angina ([place]) 04/18/2018  . Senile purpura ([place]) 10/07/2017  . Hyperglycemia 10/07/2017  . Weakness of left leg 06/21/2017  . DJD (degenerative joint disease)   . Arthritis   . Neck pain 02/08/2017  . Venous insufficiency 12/29/2016  . Ganglion cyst of tendon sheath of right hand 12/29/2016  . Avulsion fracture of metatarsal bone of right foot 11/25/2016  . Gout 11/20/2016  . Obesity, Class I, BMI 30-34.9 10/03/2015  . Hereditary and idiopathic peripheral neuropathy 09/25/2014  . Allergic rhinitis 05/31/2014  . Fatigue 11/09/2013  . CKD (chronic kidney disease), stage III ([place]) 10/23/2013  . Dizziness 08/23/2013  . Neuropathy ([place]) 08/21/2013  . RLS (restless legs syndrome) 01/03/2013  . Chronic bilateral low back pain with left-sided sciatica 10/09/2011  . History of bladder cancer 04/01/2010  . OSTEOARTHRITIS, LOWER LEG, LEFT 10/04/2009  . TOBACCO ABUSE, HX OF 12/26/2008  . ANEMIA, IRON DEFICIENCY 11/06/2008  . 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 Essential hypertension 02/21/2007  . OSA (obstructive sleep apnea) 11/02/2006  . Hyperlipidemia 10/12/2006  . Depression, major, single episode, in partial remission ([place]) 10/12/2006  . Coronary atherosclerosis 10/12/2006  . Asthma 10/12/2006  . GERD 10/12/2006  . Irritable bowel syndrome 10/12/2006    Assessment & Plan: Left inguinal hernia for open LIH repair    Matt B. Hassell Done, MD, [facility] [phone] beeper [phone]  01/01/2019 8:44 AM

## 2019-01-02 ENCOUNTER — Other Ambulatory Visit: Payer: Self-pay

## 2019-01-02 ENCOUNTER — Ambulatory Visit (HOSPITAL_COMMUNITY): Payer: PPO | Admitting: Physician Assistant

## 2019-01-02 ENCOUNTER — Encounter (HOSPITAL_COMMUNITY)
Admission: RE | Disposition: A | Discharge status: Home / Self Care | Payer: Self-pay | Source: Home / Self Care | Attending: Surgery

## 2019-01-02 ENCOUNTER — Encounter (HOSPITAL_COMMUNITY): Payer: Self-pay

## 2019-01-02 ENCOUNTER — Ambulatory Visit (HOSPITAL_COMMUNITY): Payer: PPO | Admitting: Certified Registered"

## 2019-01-02 ENCOUNTER — Ambulatory Visit (HOSPITAL_COMMUNITY)
Admission: RE | Admit: 2019-01-02 | Discharge: 2019-01-02 | Disposition: A | Discharge status: Home / Self Care | Payer: PPO | Attending: Surgery | Admitting: Surgery

## 2019-01-02 DIAGNOSIS — M5442 Lumbago with sciatica, left side: Secondary | ICD-10-CM | POA: Insufficient documentation

## 2019-01-02 DIAGNOSIS — Z79899 Other long term (current) drug therapy: Secondary | ICD-10-CM | POA: Insufficient documentation

## 2019-01-02 DIAGNOSIS — G2581 Restless legs syndrome: Secondary | ICD-10-CM | POA: Insufficient documentation

## 2019-01-02 DIAGNOSIS — Z6833 Body mass index (BMI) 33.0-33.9, adult: Secondary | ICD-10-CM | POA: Insufficient documentation

## 2019-01-02 DIAGNOSIS — Z951 Presence of aortocoronary bypass graft: Secondary | ICD-10-CM | POA: Insufficient documentation

## 2019-01-02 DIAGNOSIS — I129 Hypertensive chronic kidney disease with stage 1 through stage 4 chronic kidney disease, or unspecified chronic kidney disease: Secondary | ICD-10-CM | POA: Diagnosis not present

## 2019-01-02 DIAGNOSIS — I2510 Atherosclerotic heart disease of native coronary artery without angina pectoris: Secondary | ICD-10-CM | POA: Diagnosis not present

## 2019-01-02 DIAGNOSIS — Z8551 Personal history of malignant neoplasm of bladder: Secondary | ICD-10-CM | POA: Insufficient documentation

## 2019-01-02 DIAGNOSIS — J45909 Unspecified asthma, uncomplicated: Secondary | ICD-10-CM | POA: Insufficient documentation

## 2019-01-02 DIAGNOSIS — E669 Obesity, unspecified: Secondary | ICD-10-CM | POA: Insufficient documentation

## 2019-01-02 DIAGNOSIS — M1711 Unilateral primary osteoarthritis, right knee: Secondary | ICD-10-CM | POA: Diagnosis not present

## 2019-01-02 DIAGNOSIS — K4090 Unilateral inguinal hernia, without obstruction or gangrene, not specified as recurrent: Secondary | ICD-10-CM | POA: Insufficient documentation

## 2019-01-02 DIAGNOSIS — Z9079 Acquired absence of other genital organ(s): Secondary | ICD-10-CM | POA: Diagnosis not present

## 2019-01-02 DIAGNOSIS — K219 Gastro-esophageal reflux disease without esophagitis: Secondary | ICD-10-CM | POA: Insufficient documentation

## 2019-01-02 DIAGNOSIS — M479 Spondylosis, unspecified: Secondary | ICD-10-CM | POA: Diagnosis not present

## 2019-01-02 DIAGNOSIS — Z7951 Long term (current) use of inhaled steroids: Secondary | ICD-10-CM | POA: Insufficient documentation

## 2019-01-02 DIAGNOSIS — M19041 Primary osteoarthritis, right hand: Secondary | ICD-10-CM | POA: Diagnosis not present

## 2019-01-02 DIAGNOSIS — G4733 Obstructive sleep apnea (adult) (pediatric): Secondary | ICD-10-CM | POA: Diagnosis not present

## 2019-01-02 DIAGNOSIS — N183 Chronic kidney disease, stage 3 unspecified: Secondary | ICD-10-CM | POA: Insufficient documentation

## 2019-01-02 DIAGNOSIS — Z87891 Personal history of nicotine dependence: Secondary | ICD-10-CM | POA: Insufficient documentation

## 2019-01-02 DIAGNOSIS — F329 Major depressive disorder, single episode, unspecified: Secondary | ICD-10-CM | POA: Diagnosis not present

## 2019-01-02 DIAGNOSIS — Z9049 Acquired absence of other specified parts of digestive tract: Secondary | ICD-10-CM | POA: Insufficient documentation

## 2019-01-02 DIAGNOSIS — I700 Atherosclerosis of aorta: Secondary | ICD-10-CM | POA: Insufficient documentation

## 2019-01-02 DIAGNOSIS — E785 Hyperlipidemia, unspecified: Secondary | ICD-10-CM | POA: Diagnosis not present

## 2019-01-02 DIAGNOSIS — K589 Irritable bowel syndrome without diarrhea: Secondary | ICD-10-CM | POA: Diagnosis not present

## 2019-01-02 DIAGNOSIS — E1122 Type 2 diabetes mellitus with diabetic chronic kidney disease: Secondary | ICD-10-CM | POA: Diagnosis not present

## 2019-01-02 DIAGNOSIS — Z8546 Personal history of malignant neoplasm of prostate: Secondary | ICD-10-CM | POA: Insufficient documentation

## 2019-01-02 DIAGNOSIS — Z833 Family history of diabetes mellitus: Secondary | ICD-10-CM | POA: Insufficient documentation

## 2019-01-02 DIAGNOSIS — H9313 Tinnitus, bilateral: Secondary | ICD-10-CM | POA: Insufficient documentation

## 2019-01-02 DIAGNOSIS — E890 Postprocedural hypothyroidism: Secondary | ICD-10-CM | POA: Diagnosis not present

## 2019-01-02 DIAGNOSIS — M109 Gout, unspecified: Secondary | ICD-10-CM | POA: Insufficient documentation

## 2019-01-02 DIAGNOSIS — Z981 Arthrodesis status: Secondary | ICD-10-CM | POA: Insufficient documentation

## 2019-01-02 DIAGNOSIS — I10 Essential (primary) hypertension: Secondary | ICD-10-CM | POA: Diagnosis not present

## 2019-01-02 DIAGNOSIS — Z791 Long term (current) use of non-steroidal anti-inflammatories (NSAID): Secondary | ICD-10-CM | POA: Insufficient documentation

## 2019-01-02 DIAGNOSIS — G8929 Other chronic pain: Secondary | ICD-10-CM | POA: Insufficient documentation

## 2019-01-02 DIAGNOSIS — I25119 Atherosclerotic heart disease of native coronary artery with unspecified angina pectoris: Secondary | ICD-10-CM | POA: Insufficient documentation

## 2019-01-02 DIAGNOSIS — Z8249 Family history of ischemic heart disease and other diseases of the circulatory system: Secondary | ICD-10-CM | POA: Insufficient documentation

## 2019-01-02 DIAGNOSIS — D631 Anemia in chronic kidney disease: Secondary | ICD-10-CM | POA: Diagnosis not present

## 2019-01-02 DIAGNOSIS — Z7982 Long term (current) use of aspirin: Secondary | ICD-10-CM | POA: Insufficient documentation

## 2019-01-02 DIAGNOSIS — D509 Iron deficiency anemia, unspecified: Secondary | ICD-10-CM | POA: Insufficient documentation

## 2019-01-02 HISTORY — PX: INGUINAL HERNIA REPAIR: SHX194

## 2019-01-02 SURGERY — REPAIR, HERNIA, INGUINAL, ADULT
Anesthesia: General | Site: Inguinal | Laterality: Left

## 2019-01-02 MED ORDER — MIDAZOLAM HCL 2 MG/2ML IJ SOLN
1.0000 mg | INTRAMUSCULAR | Status: DC
  Filled 2019-01-02: qty 2

## 2019-01-02 MED ORDER — MEPERIDINE HCL 50 MG/ML IJ SOLN: 6.2500 mg | INTRAMUSCULAR | Status: DC | PRN

## 2019-01-02 MED ORDER — HYDROCODONE-ACETAMINOPHEN 5-325 MG PO TABS: 1.0000 | ORAL_TABLET | Freq: Four times a day (QID) | ORAL | 0 refills | Status: DC | PRN

## 2019-01-02 MED ORDER — PROPOFOL 10 MG/ML IV BOLUS
INTRAVENOUS | Status: DC | PRN
  Administered 2019-01-02: 140 mg via INTRAVENOUS

## 2019-01-02 MED ORDER — FENTANYL CITRATE (PF) 100 MCG/2ML IJ SOLN
INTRAMUSCULAR | Status: DC | PRN
  Administered 2019-01-02: 25 ug via INTRAVENOUS
  Administered 2019-01-02: 50 ug via INTRAVENOUS
  Administered 2019-01-02: 25 ug via INTRAVENOUS

## 2019-01-02 MED ORDER — CEFAZOLIN SODIUM-DEXTROSE 2-4 GM/100ML-% IV SOLN
2.0000 g | INTRAVENOUS | Status: AC
  Administered 2019-01-02: 2 g via INTRAVENOUS

## 2019-01-02 MED ORDER — ACETAMINOPHEN 500 MG PO TABS
1000.0000 mg | ORAL_TABLET | ORAL | Status: AC
  Administered 2019-01-02: 1000 mg via ORAL
  Filled 2019-01-02: qty 2

## 2019-01-02 MED ORDER — DEXAMETHASONE SODIUM PHOSPHATE 10 MG/ML IJ SOLN
INTRAMUSCULAR | Status: AC
  Filled 2019-01-02: qty 1

## 2019-01-02 MED ORDER — PROPOFOL 10 MG/ML IV BOLUS
INTRAVENOUS | Status: AC
  Filled 2019-01-02: qty 20

## 2019-01-02 MED ORDER — KETOROLAC TROMETHAMINE 15 MG/ML IJ SOLN
INTRAMUSCULAR | Status: AC
  Filled 2019-01-02: qty 1

## 2019-01-02 MED ORDER — CEFAZOLIN SODIUM-DEXTROSE 2-4 GM/100ML-% IV SOLN
INTRAVENOUS | Status: AC
  Filled 2019-01-02: qty 100

## 2019-01-02 MED ORDER — FENTANYL CITRATE (PF) 100 MCG/2ML IJ SOLN
INTRAMUSCULAR | Status: AC
  Filled 2019-01-02: qty 2

## 2019-01-02 MED ORDER — ONDANSETRON HCL 4 MG/2ML IJ SOLN
INTRAMUSCULAR | Status: AC
  Filled 2019-01-02: qty 2

## 2019-01-02 MED ORDER — CHLORHEXIDINE GLUCONATE CLOTH 2 % EX PADS: 6.0000 | MEDICATED_PAD | Freq: Once | CUTANEOUS | Status: DC

## 2019-01-02 MED ORDER — LIDOCAINE 2% (20 MG/ML) 5 ML SYRINGE
INTRAMUSCULAR | Status: AC
  Filled 2019-01-02: qty 5

## 2019-01-02 MED ORDER — TRAMADOL HCL 50 MG PO TABS: 50.0000 mg | ORAL_TABLET | Freq: Four times a day (QID) | ORAL | 1 refills | Status: DC | PRN

## 2019-01-02 MED ORDER — EPHEDRINE SULFATE-NACL 50-0.9 MG/10ML-% IV SOSY
PREFILLED_SYRINGE | INTRAVENOUS | Status: DC | PRN
  Administered 2019-01-02: 10 mg via INTRAVENOUS
  Administered 2019-01-02: 5 mg via INTRAVENOUS

## 2019-01-02 MED ORDER — BUPIVACAINE LIPOSOME 1.3 % IJ SUSP
INTRAMUSCULAR | Status: DC | PRN
  Administered 2019-01-02: 20 mL

## 2019-01-02 MED ORDER — FENTANYL CITRATE (PF) 100 MCG/2ML IJ SOLN
50.0000 ug | INTRAMUSCULAR | Status: DC
  Filled 2019-01-02: qty 2

## 2019-01-02 MED ORDER — ONDANSETRON HCL 4 MG/2ML IJ SOLN: 4.0000 mg | Freq: Once | INTRAMUSCULAR | Status: DC | PRN

## 2019-01-02 MED ORDER — HEPARIN SODIUM (PORCINE) 5000 UNIT/ML IJ SOLN
5000.0000 [IU] | Freq: Once | INTRAMUSCULAR | Status: AC
  Administered 2019-01-02: 5000 [IU] via SUBCUTANEOUS
  Filled 2019-01-02: qty 1

## 2019-01-02 MED ORDER — KETOROLAC TROMETHAMINE 15 MG/ML IJ SOLN
15.0000 mg | Freq: Once | INTRAMUSCULAR | Status: AC
  Administered 2019-01-02: 15 mg via INTRAVENOUS

## 2019-01-02 MED ORDER — LACTATED RINGERS IV SOLN
INTRAVENOUS | Status: DC
  Administered 2019-01-02 (×2): via INTRAVENOUS

## 2019-01-02 MED ORDER — ONDANSETRON HCL 4 MG/2ML IJ SOLN
INTRAMUSCULAR | Status: DC | PRN
  Administered 2019-01-02: 4 mg via INTRAVENOUS

## 2019-01-02 MED ORDER — LIDOCAINE 2% (20 MG/ML) 5 ML SYRINGE
INTRAMUSCULAR | Status: DC | PRN
  Administered 2019-01-02: 40 mg via INTRAVENOUS

## 2019-01-02 MED ORDER — 0.9 % SODIUM CHLORIDE (POUR BTL) OPTIME
TOPICAL | Status: DC | PRN
  Administered 2019-01-02: 1000 mL

## 2019-01-02 MED ORDER — FENTANYL CITRATE (PF) 100 MCG/2ML IJ SOLN
25.0000 ug | INTRAMUSCULAR | Status: DC | PRN
  Administered 2019-01-02 (×2): 50 ug via INTRAVENOUS

## 2019-01-02 MED ORDER — DEXAMETHASONE SODIUM PHOSPHATE 10 MG/ML IJ SOLN
INTRAMUSCULAR | Status: DC | PRN
  Administered 2019-01-02: 4 mg via INTRAVENOUS

## 2019-01-02 SURGICAL SUPPLY — 35 items
ADH SKN CLS APL DERMABOND .7 (GAUZE/BANDAGES/DRESSINGS) ×1
BLADE SURG 15 STRL LF DISP TIS (BLADE) ×1 IMPLANT
BLADE SURG 15 STRL SS (BLADE) ×2
COVER SURGICAL LIGHT HANDLE (MISCELLANEOUS) ×2 IMPLANT
COVER WAND RF STERILE (DRAPES) IMPLANT
DECANTER SPIKE VIAL GLASS SM (MISCELLANEOUS) ×2 IMPLANT
DERMABOND ADVANCED (GAUZE/BANDAGES/DRESSINGS) ×1
DERMABOND ADVANCED .7 DNX12 (GAUZE/BANDAGES/DRESSINGS) IMPLANT
DISSECTOR ROUND CHERRY 3/8 STR (MISCELLANEOUS) ×1 IMPLANT
DRAIN PENROSE 18X1/2 LTX STRL (DRAIN) ×2 IMPLANT
DRAPE LAPAROTOMY TRNSV 102X78 (DRAPES) ×2 IMPLANT
ELECT PENCIL ROCKER SW 15FT (MISCELLANEOUS) ×2 IMPLANT
ELECT REM PT RETURN 15FT ADLT (MISCELLANEOUS) ×2 IMPLANT
GLOVE BIOGEL M 8.0 STRL (GLOVE) ×2 IMPLANT
GOWN STRL REUS W/TWL XL LVL3 (GOWN DISPOSABLE) ×4 IMPLANT
HANDLE STAPLE EGIA 4 XL (STAPLE) ×1 IMPLANT
KIT BASIN OR (CUSTOM PROCEDURE TRAY) ×2 IMPLANT
KIT TURNOVER KIT A (KITS) ×1 IMPLANT
MESH HERNIA 3X6 (Mesh General) ×1 IMPLANT
NEEDLE HYPO 22GX1.5 SAFETY (NEEDLE) ×2 IMPLANT
PACK BASIC VI WITH GOWN DISP (CUSTOM PROCEDURE TRAY) ×2 IMPLANT
RELOAD TRI 60 ART MED THCK PUR (STAPLE) ×1 IMPLANT
SPONGE LAP 4X18 RFD (DISPOSABLE) ×2 IMPLANT
STAPLER VISISTAT 35W (STAPLE) ×1 IMPLANT
SUT PROLENE 2 0 CT2 30 (SUTURE) ×6 IMPLANT
SUT SILK 2 0 SH (SUTURE) ×1 IMPLANT
SUT VIC AB 2-0 SH 27 (SUTURE) ×2
SUT VIC AB 2-0 SH 27X BRD (SUTURE) ×1 IMPLANT
SUT VIC AB 4-0 SH 18 (SUTURE) ×2 IMPLANT
SUT VICRYL 4-0 (SUTURE) ×2 IMPLANT
SYR 20ML LL LF (SYRINGE) ×2 IMPLANT
SYR BULB IRRIGATION 50ML (SYRINGE) ×2 IMPLANT
TOWEL OR 17X26 10 PK STRL BLUE (TOWEL DISPOSABLE) ×2 IMPLANT
TOWEL OR NON WOVEN STRL DISP B (DISPOSABLE) ×2 IMPLANT
YANKAUER SUCT BULB TIP 10FT TU (MISCELLANEOUS) ×2 IMPLANT

## 2019-01-02 NOTE — Op Note (Signed)
BEKIM VENNE  May 22, 1940 02 January 2019    PCP:  [name], MD   Surgeon: Kaylyn Lim, MD, FACS  Asst:  none  Anes:  General my LMA  Preop Dx: Left inguinal hernia Postop Dx: Left indirect inguinal hernia containing a large fat mass  Procedure: Open left inguinal hernia Location Surgery: [facility] 1 Complications: none  EBL:   minimal cc  Drains: none  Description of Procedure:  The patient was taken to OR 1 .  After anesthesia was administered and the patient was prepped  with [employer] and a timeout was performed.  The patient was previously marked on the left side.  Patient's indication for surgery was pain radiating into the scrotum and CT showing a large fatty mass hernia.  An oblique incision was made over the external oblique and external ring and these were structures were nicely exposed.  The fascia were divided along their fibers and the cord was mobilized.  A large fatty mass herniated along the cord out the external ring and this was separated from the cord structures which were preserved.  Initially I attempted to separate this and reduce it but it would not stay reduced so amputated it with the tri-stapler purple load.  The ring was then tightened with 2 sutures of 2-0 Prolene.  A piece of Prolene mesh was cut to fit and sutured along the inguinal ligament and medially and then sutured sutured to itself as a came around the cord structures which seem to have plenty of room.  The external oblique was then closed with a running 2-0 Vicryl and 4-0 Vicryl was used in the subcutaneous tissue and then a subcuticular 4-0 Monocryl and Dermabond.  The patient tolerated the procedure well and was taken to the PACU in stable condition.     Matt B. Hassell Done, [place], [facility], [place]

## 2019-01-02 NOTE — Anesthesia Postprocedure Evaluation (Signed)
Anesthesia Post Note  Patient: Joshua [name]  Procedure(s) Performed: OPEN LEFT INGUINAL HERNIA REPAIR WITH MESH (Left Inguinal)     Patient location during evaluation: Phase II Anesthesia Type: General Level of consciousness: awake Pain management: pain level controlled Vital Signs Assessment: post-procedure vital signs reviewed and stable Respiratory status: spontaneous breathing Cardiovascular status: stable Postop Assessment: no apparent nausea or vomiting Anesthetic complications: no    Last Vitals:  Vitals:   01/02/19 1200 01/02/19 1225  BP: (!) 145/70 (!) 146/72  Pulse: 79 80  Resp: 13 14  Temp: (!) 36.3 C (!) 36.4 C  SpO2: 96% 96%    Last Pain:  Vitals:   01/02/19 1225  TempSrc:   PainSc: 5    Pain Goal:                   Huston Foley

## 2019-01-02 NOTE — Interval H&P Note (Signed)
History and Physical Interval Note:  01/02/2019 9:13 AM  Joshua [name]  has presented today for surgery, with the diagnosis of LEFT INGUINAL HERNIA.  The various methods of treatment have been discussed with the patient and family. After consideration of risks, benefits and other options for treatment, the patient has consented to  Procedure(s): OPEN LEFT INGUINAL HERNIA REPAIR (Left) as a surgical intervention.  The patient's history has been reviewed, patient examined, no change in status, stable for surgery.  I have reviewed the patient's chart and labs.  Questions were answered to the patient's satisfaction.     Pedro Earls

## 2019-01-02 NOTE — Anesthesia Procedure Notes (Signed)
Procedure Name: LMA Insertion Date/Time: 01/02/2019 9:32 AM Performed by: Eben Burow, CRNA Pre-anesthesia Checklist: Patient identified, Emergency Drugs available, Suction available, Patient being monitored and Timeout performed Patient Re-evaluated:Patient Re-evaluated prior to induction Oxygen Delivery Method: Circle system utilized Preoxygenation: Pre-oxygenation with 100% oxygen Induction Type: IV induction Ventilation: Mask ventilation without difficulty LMA: LMA inserted LMA Size: 4.0 Number of attempts: 1 Tube secured with: Tape Dental Injury: Teeth and Oropharynx as per pre-operative assessment

## 2019-01-02 NOTE — Transfer of Care (Signed)
Immediate Anesthesia Transfer of Care Note  Patient: Joshua [name]  Procedure(s) Performed: OPEN LEFT INGUINAL HERNIA REPAIR WITH MESH (Left Inguinal)  Patient Location: PACU  Anesthesia Type:General  Level of Consciousness: awake, alert  and patient cooperative  Airway & Oxygen Therapy: Patient Spontanous Breathing and Patient connected to face mask oxygen  Post-op Assessment: Report given to RN and Post -op Vital signs reviewed and stable  Post vital signs: Reviewed and stable  Last Vitals:  Vitals Value Taken Time  BP 146/71 01/02/19 1121  Temp 36.5 C 01/02/19 1121  Pulse 81 01/02/19 1124  Resp 13 01/02/19 1124  SpO2 100 % 01/02/19 1124  Vitals shown include unvalidated device data.  Last Pain:  Vitals:   01/02/19 1121  TempSrc:   PainSc: (P) Asleep         Complications: No apparent anesthesia complications

## 2019-01-02 NOTE — Discharge Instructions (Signed)
Inguinal Hernia, Adult °An inguinal hernia is when fat or your intestines push through a weak spot in a muscle where your leg meets your lower belly (groin). This causes a rounded lump (bulge). This kind of hernia could also be: °· In your scrotum, if you are male. °· In folds of skin around your vagina, if you are male. °There are three types of inguinal hernias. These include: °· Hernias that can be pushed back into the belly (are reducible). This type rarely causes pain. °· Hernias that cannot be pushed back into the belly (are incarcerated). °· Hernias that cannot be pushed back into the belly and lose their blood supply (are strangulated). This type needs emergency surgery. °If you do not have symptoms, you may not need treatment. If you have symptoms or a large hernia, you may need surgery. °Follow these instructions at home: °Lifestyle °· Do these things if told by your doctor so you do not have trouble pooping (constipation): °? Drink enough fluid to keep your pee (urine) pale yellow. °? Eat foods that have a lot of fiber. These include fresh fruits and vegetables, whole grains, and beans. °? Limit foods that are high in fat and processed sugars. These include foods that are fried or sweet. °? Take medicine for trouble pooping. °· Avoid lifting heavy objects. °· Avoid standing for long amounts of time. °· Do not use any products that contain nicotine or tobacco. These include cigarettes and e-cigarettes. If you need help quitting, ask your doctor. °· Stay at a healthy weight. °General instructions °· You may try to push your hernia in by very gently pressing on it when you are lying down. Do not try to force the bulge back in if it will not push in easily. °· Watch your hernia for any changes in shape, size, or color. Tell your doctor if you see any changes. °· Take over-the-counter and prescription medicines only as told by your doctor. °· Keep all follow-up visits as told by your doctor. This is  important. °Contact a doctor if: °· You have a fever. °· You have new symptoms. °· Your symptoms get worse. °Get help right away if: °· The area where your leg meets your lower belly has: °? Pain that gets worse suddenly. °? A bulge that gets bigger suddenly, and it does not get smaller after that. °? A bulge that turns red or purple. °? A bulge that is painful when you touch it. °· You are a man, and your scrotum: °? Suddenly feels painful. °? Suddenly changes in size. °· You cannot push the hernia in by very gently pressing on it when you are lying down. Do not try to force the bulge back in if it will not push in easily. °· You feel sick to your stomach (nauseous), and that feeling does not go away. °· You throw up (vomit), and that keeps happening. °· You have a fast heartbeat. °· You cannot poop (have a bowel movement) or pass gas. °These symptoms may be an emergency. Do not wait to see if the symptoms will go away. Get medical help right away. Call your local emergency services (911 in the [place] °Summary °· An inguinal hernia is when fat or your intestines push through a weak spot in a muscle where your leg meets your lower belly (groin). This causes a rounded lump (bulge). °· If you do not have symptoms, you may not need treatment. If you have symptoms or a large hernia, you   may need surgery. °· Avoid lifting heavy objects. Also avoid standing for long amounts of time. °· Do not try to force the bulge back in if it will not push in easily. °This information is not intended to replace advice given to you by your health care provider. Make sure you discuss any questions you have with your health care provider. °Document Released: 04/09/2006 Document Revised: 04/10/2017 Document Reviewed: 12/09/2016 °Elsevier Patient Education © 2020 Elsevier Inc. ° ° °General Anesthesia, Adult, Care After °This sheet gives you information about how to care for yourself after your procedure. Your health care provider may also  give you more specific instructions. If you have problems or questions, contact your health care provider. °What can I expect after the procedure? °After the procedure, the following side effects are common: °· Pain or discomfort at the IV site. °· Nausea. °· Vomiting. °· Sore throat. °· Trouble concentrating. °· Feeling cold or chills. °· Weak or tired. °· Sleepiness and fatigue. °· Soreness and body aches. These side effects can affect parts of the body that were not involved in surgery. °Follow these instructions at home: ° °For at least 24 hours after the procedure: °· Have a responsible adult stay with you. It is important to have someone help care for you until you are awake and alert. °· Rest as needed. °· Do not: °? Participate in activities in which you could fall or become injured. °? Drive. °? Use heavy machinery. °? Drink alcohol. °? Take sleeping pills or medicines that cause drowsiness. °? Make important decisions or sign legal documents. °? Take care of children on your own. °Eating and drinking °· Follow any instructions from your health care provider about eating or drinking restrictions. °· When you feel hungry, start by eating small amounts of foods that are soft and easy to digest (bland), such as toast. Gradually return to your regular diet. °· Drink enough fluid to keep your urine pale yellow. °· If you vomit, rehydrate by drinking water, juice, or clear broth. °General instructions °· If you have sleep apnea, surgery and certain medicines can increase your risk for breathing problems. Follow instructions from your health care provider about wearing your sleep device: °? Anytime you are sleeping, including during daytime naps. °? While taking prescription pain medicines, sleeping medicines, or medicines that make you drowsy. °· Return to your normal activities as told by your health care provider. Ask your health care provider what activities are safe for you. °· Take over-the-counter and  prescription medicines only as told by your health care provider. °· If you smoke, do not smoke without supervision. °· Keep all follow-up visits as told by your health care provider. This is important. °Contact a health care provider if: °· You have nausea or vomiting that does not get better with medicine. °· You cannot eat or drink without vomiting. °· You have pain that does not get better with medicine. °· You are unable to pass urine. °· You develop a skin rash. °· You have a fever. °· You have redness around your IV site that gets worse. °Get help right away if: °· You have difficulty breathing. °· You have chest pain. °· You have blood in your urine or stool, or you vomit blood. °Summary °· After the procedure, it is common to have a sore throat or nausea. It is also common to feel tired. °· Have a responsible adult stay with you for the first 24 hours after general anesthesia. It is   important to have someone help care for you until you are awake and alert. °· When you feel hungry, start by eating small amounts of foods that are soft and easy to digest (bland), such as toast. Gradually return to your regular diet. °· Drink enough fluid to keep your urine pale yellow. °· Return to your normal activities as told by your health care provider. Ask your health care provider what activities are safe for you. °This information is not intended to replace advice given to you by your health care provider. Make sure you discuss any questions you have with your health care provider. °Document Released: 06/15/2000 Document Revised: 03/12/2017 Document Reviewed: 10/23/2016 °Elsevier Patient Education © 2020 Elsevier Inc. ° ° °

## 2019-01-03 ENCOUNTER — Encounter (HOSPITAL_COMMUNITY): Payer: Self-pay | Admitting: Surgery

## 2019-01-19 DIAGNOSIS — J3089 Other allergic rhinitis: Secondary | ICD-10-CM | POA: Diagnosis not present

## 2019-01-19 DIAGNOSIS — J3081 Allergic rhinitis due to animal (cat) (dog) hair and dander: Secondary | ICD-10-CM | POA: Diagnosis not present

## 2019-01-19 DIAGNOSIS — J301 Allergic rhinitis due to pollen: Secondary | ICD-10-CM | POA: Diagnosis not present

## 2019-01-20 ENCOUNTER — Telehealth: Payer: Self-pay | Admitting: Family Medicine

## 2019-01-20 NOTE — Telephone Encounter (Signed)
See request °

## 2019-01-20 NOTE — Telephone Encounter (Signed)
Requested medication (s) are due for refill today: yes  Requested medication (s) are on the active medication list: yes  Last refill:  05/17/2018  Future visit scheduled:yes  Notes to clinic:  Refill cannot be delegated    Requested Prescriptions  Pending Prescriptions Disp Refills   diazepam (VALIUM) 5 MG tablet 30 tablet 1    Sig: Take 1 tablet (5 mg total) by mouth at bedtime as needed.     Not Delegated - Psychiatry:  Anxiolytics/Hypnotics Failed - 01/20/2019  1:17 PM      Failed - This refill cannot be delegated      Failed - Urine Drug Screen completed in last 360 days.      Passed - Valid encounter within last 6 months    Recent Outpatient Visits          1 month ago Chronic bilateral low back pain with left-sided sciatica   [place], Olevia Bowens, DO   1 month ago Oral candida   [place], [place], MD   2 months ago Lumbar radiculopathy   [place], [place], DO   2 months ago Hyperlipidemia, unspecified hyperlipidemia type    [facility], Brayton Mars, MD   2 months ago Left hip pain   [place], [place], DO      Future Appointments            In 2 weeks Yong Channel, Brayton Mars, MD [place], [place]   In 3 weeks Tamala Julian, Olevia Bowens, [place], [facility]

## 2019-01-20 NOTE — Telephone Encounter (Signed)
Copied from [place] [id number]. Topic: Quick Communication - Rx Refill/Question >> Jan 20, 2019  1:09 PM Yvette Rack wrote: Medication: diazepam (VALIUM) 5 MG tablet  Has the patient contacted their pharmacy? yes   Preferred Pharmacy (with phone number or street name): [facility] [place], [place] [address] [phone] (Phone) [phone] (Fax)  Agent: Please be advised that RX refills may take up to 3 business days. We ask that you follow-up with your pharmacy.

## 2019-01-23 ENCOUNTER — Other Ambulatory Visit: Payer: Self-pay

## 2019-01-23 MED ORDER — DIAZEPAM 5 MG PO TABS: 5.0000 mg | ORAL_TABLET | Freq: Every evening | ORAL | 1 refills | Status: DC | PRN

## 2019-01-24 ENCOUNTER — Telehealth: Payer: Self-pay | Admitting: Pulmonary Disease

## 2019-01-24 ENCOUNTER — Ambulatory Visit: Payer: PPO | Admitting: Family Medicine

## 2019-01-24 NOTE — Telephone Encounter (Signed)
Called and spoke to pt. Pt states he was told by [facility] that they do not have his order from July to get a new CPAP. Called [facility] and spoke to [place] and was advise the pt was not eligible until late October and they are processing the order now. Estill Bamberg states the pt will be contacted likely today with an update. Called and spoke to pt. Informed him of the information. Pt verbalized understanding and denied any further questions or concerns at this time.

## 2019-01-25 ENCOUNTER — Other Ambulatory Visit: Payer: Self-pay | Admitting: Family Medicine

## 2019-01-25 DIAGNOSIS — G4733 Obstructive sleep apnea (adult) (pediatric): Secondary | ICD-10-CM | POA: Diagnosis not present

## 2019-01-26 ENCOUNTER — Telehealth: Payer: Self-pay | Admitting: Family Medicine

## 2019-01-26 DIAGNOSIS — J3081 Allergic rhinitis due to animal (cat) (dog) hair and dander: Secondary | ICD-10-CM | POA: Diagnosis not present

## 2019-01-26 DIAGNOSIS — J301 Allergic rhinitis due to pollen: Secondary | ICD-10-CM | POA: Diagnosis not present

## 2019-01-26 DIAGNOSIS — J3089 Other allergic rhinitis: Secondary | ICD-10-CM | POA: Diagnosis not present

## 2019-01-26 NOTE — Telephone Encounter (Signed)
I called the patient to schedule his AWV with Loma Sousa.  He wanted to know if he needs to fast on the morning of 11/16 before his physical. Please advise.

## 2019-01-27 NOTE — Telephone Encounter (Signed)
Returned pt called and advised him to come in fasting.

## 2019-02-01 ENCOUNTER — Ambulatory Visit: Payer: PPO

## 2019-02-01 DIAGNOSIS — J301 Allergic rhinitis due to pollen: Secondary | ICD-10-CM | POA: Diagnosis not present

## 2019-02-01 DIAGNOSIS — J4530 Mild persistent asthma, uncomplicated: Secondary | ICD-10-CM | POA: Diagnosis not present

## 2019-02-01 DIAGNOSIS — G4733 Obstructive sleep apnea (adult) (pediatric): Secondary | ICD-10-CM | POA: Diagnosis not present

## 2019-02-01 DIAGNOSIS — J3081 Allergic rhinitis due to animal (cat) (dog) hair and dander: Secondary | ICD-10-CM | POA: Diagnosis not present

## 2019-02-01 DIAGNOSIS — J3089 Other allergic rhinitis: Secondary | ICD-10-CM | POA: Diagnosis not present

## 2019-02-06 ENCOUNTER — Ambulatory Visit (INDEPENDENT_AMBULATORY_CARE_PROVIDER_SITE_OTHER): Payer: PPO

## 2019-02-06 ENCOUNTER — Other Ambulatory Visit: Payer: Self-pay

## 2019-02-06 ENCOUNTER — Ambulatory Visit (INDEPENDENT_AMBULATORY_CARE_PROVIDER_SITE_OTHER): Payer: PPO | Admitting: Family Medicine

## 2019-02-06 ENCOUNTER — Encounter: Payer: Self-pay | Admitting: Family Medicine

## 2019-02-06 VITALS — BP 130/64 | HR 80 | Temp 98.0°F | Ht 67.0 in | Wt 211.8 lb

## 2019-02-06 VITALS — BP 130/64 | Temp 98.0°F | Ht 67.0 in | Wt 211.9 lb

## 2019-02-06 DIAGNOSIS — Z0000 Encounter for general adult medical examination without abnormal findings: Secondary | ICD-10-CM

## 2019-02-06 DIAGNOSIS — N183 Chronic kidney disease, stage 3 unspecified: Secondary | ICD-10-CM

## 2019-02-06 DIAGNOSIS — Z8551 Personal history of malignant neoplasm of bladder: Secondary | ICD-10-CM

## 2019-02-06 DIAGNOSIS — R739 Hyperglycemia, unspecified: Secondary | ICD-10-CM | POA: Diagnosis not present

## 2019-02-06 DIAGNOSIS — I10 Essential (primary) hypertension: Secondary | ICD-10-CM | POA: Diagnosis not present

## 2019-02-06 DIAGNOSIS — I700 Atherosclerosis of aorta: Secondary | ICD-10-CM

## 2019-02-06 DIAGNOSIS — M10071 Idiopathic gout, right ankle and foot: Secondary | ICD-10-CM

## 2019-02-06 DIAGNOSIS — K219 Gastro-esophageal reflux disease without esophagitis: Secondary | ICD-10-CM

## 2019-02-06 DIAGNOSIS — K589 Irritable bowel syndrome without diarrhea: Secondary | ICD-10-CM

## 2019-02-06 DIAGNOSIS — I2510 Atherosclerotic heart disease of native coronary artery without angina pectoris: Secondary | ICD-10-CM | POA: Diagnosis not present

## 2019-02-06 DIAGNOSIS — E785 Hyperlipidemia, unspecified: Secondary | ICD-10-CM | POA: Diagnosis not present

## 2019-02-06 DIAGNOSIS — J4540 Moderate persistent asthma, uncomplicated: Secondary | ICD-10-CM

## 2019-02-06 DIAGNOSIS — F325 Major depressive disorder, single episode, in full remission: Secondary | ICD-10-CM | POA: Diagnosis not present

## 2019-02-06 DIAGNOSIS — Z1211 Encounter for screening for malignant neoplasm of colon: Secondary | ICD-10-CM

## 2019-02-06 DIAGNOSIS — G629 Polyneuropathy, unspecified: Secondary | ICD-10-CM | POA: Diagnosis not present

## 2019-02-06 NOTE — Assessment & Plan Note (Signed)
#  GERD-failed H2 blocker trial in the past

## 2019-02-06 NOTE — Patient Instructions (Signed)
Joshua [name] , Thank you for taking time to come for your Medicare Wellness Visit. I appreciate your ongoing commitment to your health goals. Please review the following plan we discussed and let me know if I can assist you in the future.   Screening recommendations/referrals: Colorectal Screening: Cologuard ordered today; please review brochure  Vision and Dental Exams: Recommended annual ophthalmology exams for early detection of glaucoma and other disorders of the eye Recommended annual dental exams for proper oral hygiene  Vaccinations: Influenza vaccine: completed 11/21/18 Pneumococcal vaccine: up to date; last 05/31/14 Tdap vaccine: up to date; last 02/18/16 Shingles vaccine: Shingrix completed   Advanced directives: Please bring a copy of your POA (Power of [place]) and/or Living Will to your next appointment.  Goals: Recommend to drink at least 6-8 8oz glasses of water per day and consume a balanced diet rich in fresh fruits and vegetables.   Next appointment: Please schedule your Annual Wellness Visit with your Nurse Health Advisor in one year.  Preventive Care 29 Years and Older, Male Preventive care refers to lifestyle choices and visits with your health care provider that can promote health and wellness. What does preventive care include?  A yearly physical exam. This is also called an annual well check.  Dental exams once or twice a year.  Routine eye exams. Ask your health care provider how often you should have your eyes checked.  Personal lifestyle choices, including:  Daily care of your teeth and gums.  Regular physical activity.  Eating a healthy diet.  Avoiding tobacco and drug use.  Limiting alcohol use.  Practicing safe sex.  Taking low doses of aspirin every day if recommended by your health care provider..  Taking vitamin and mineral supplements as recommended by your health care provider. What happens during an annual well check? The services  and screenings done by your health care provider during your annual well check will depend on your age, overall health, lifestyle risk factors, and family history of disease. Counseling  Your health care provider may ask you questions about your:  Alcohol use.  Tobacco use.  Drug use.  Emotional well-being.  Home and relationship well-being.  Sexual activity.  Eating habits.  History of falls.  Memory and ability to understand (cognition).  Work and work Statistician. Screening  You may have the following tests or measurements:  Height, weight, and BMI.  Blood pressure.  Lipid and cholesterol levels. These may be checked every 5 years, or more frequently if you are over 18 years old.  Skin check.  Lung cancer screening. You may have this screening every year starting at age 54 if you have a 30-pack-year history of smoking and currently smoke or have quit within the past 15 years.  Fecal occult blood test (FOBT) of the stool. You may have this test every year starting at age 32.  Flexible sigmoidoscopy or colonoscopy. You may have a sigmoidoscopy every 5 years or a colonoscopy every 10 years starting at age 39.  Prostate cancer screening. Recommendations will vary depending on your family history and other risks.  Hepatitis C blood test.  Hepatitis B blood test.  Sexually transmitted disease (STD) testing.  Diabetes screening. This is done by checking your blood sugar (glucose) after you have not eaten for a while (fasting). You may have this done every 1-3 years.  Abdominal aortic aneurysm (AAA) screening. You may need this if you are a current or former smoker.  Osteoporosis. You may be screened starting at  age 48 if you are at high risk. Talk with your health care provider about your test results, treatment options, and if necessary, the need for more tests. Vaccines  Your health care provider may recommend certain vaccines, such as:  Influenza vaccine. This  is recommended every year.  Tetanus, diphtheria, and acellular pertussis (Tdap, Td) vaccine. You may need a Td booster every 10 years.  Zoster vaccine. You may need this after age 68.  Pneumococcal 13-valent conjugate (PCV13) vaccine. One dose is recommended after age 14.  Pneumococcal polysaccharide (PPSV23) vaccine. One dose is recommended after age 57. Talk to your health care provider about which screenings and vaccines you need and how often you need them. This information is not intended to replace advice given to you by your health care provider. Make sure you discuss any questions you have with your health care provider. Document Released: 04/05/2015 Document Revised: 11/27/2015 Document Reviewed: 01/08/2015 Elsevier Interactive Patient Education  2017 [place] Prevention in the Home Falls can cause injuries. They can happen to people of all ages. There are many things you can do to make your home safe and to help prevent falls. What can I do on the outside of my home?  Regularly fix the edges of walkways and driveways and fix any cracks.  Remove anything that might make you trip as you walk through a door, such as a raised step or threshold.  Trim any bushes or trees on the path to your home.  Use bright outdoor lighting.  Clear any walking paths of anything that might make someone trip, such as rocks or tools.  Regularly check to see if handrails are loose or broken. Make sure that both sides of any steps have handrails.  Any raised decks and porches should have guardrails on the edges.  Have any leaves, snow, or ice cleared regularly.  Use sand or salt on walking paths during winter.  Clean up any spills in your garage right away. This includes oil or grease spills. What can I do in the bathroom?  Use night lights.  Install grab bars by the toilet and in the tub and shower. Do not use towel bars as grab bars.  Use non-skid mats or decals in the tub or  shower.  If you need to sit down in the shower, use a plastic, non-slip stool.  Keep the floor dry. Clean up any water that spills on the floor as soon as it happens.  Remove soap buildup in the tub or shower regularly.  Attach bath mats securely with double-sided non-slip rug tape.  Do not have throw rugs and other things on the floor that can make you trip. What can I do in the bedroom?  Use night lights.  Make sure that you have a light by your bed that is easy to reach.  Do not use any sheets or blankets that are too big for your bed. They should not hang down onto the floor.  Have a firm chair that has side arms. You can use this for support while you get dressed.  Do not have throw rugs and other things on the floor that can make you trip. What can I do in the kitchen?  Clean up any spills right away.  Avoid walking on wet floors.  Keep items that you use a lot in easy-to-reach places.  If you need to reach something above you, use a strong step stool that has a grab bar.  Keep electrical cords out of the way.  Do not use floor polish or wax that makes floors slippery. If you must use wax, use non-skid floor wax.  Do not have throw rugs and other things on the floor that can make you trip. What can I do with my stairs?  Do not leave any items on the stairs.  Make sure that there are handrails on both sides of the stairs and use them. Fix handrails that are broken or loose. Make sure that handrails are as long as the stairways.  Check any carpeting to make sure that it is firmly attached to the stairs. Fix any carpet that is loose or worn.  Avoid having throw rugs at the top or bottom of the stairs. If you do have throw rugs, attach them to the floor with carpet tape.  Make sure that you have a light switch at the top of the stairs and the bottom of the stairs. If you do not have them, ask someone to add them for you. What else can I do to help prevent falls?   Wear shoes that:  Do not have high heels.  Have rubber bottoms.  Are comfortable and fit you well.  Are closed at the toe. Do not wear sandals.  If you use a stepladder:  Make sure that it is fully opened. Do not climb a closed stepladder.  Make sure that both sides of the stepladder are locked into place.  Ask someone to hold it for you, if possible.  Clearly mark and make sure that you can see:  Any grab bars or handrails.  First and last steps.  Where the edge of each step is.  Use tools that help you move around (mobility aids) if they are needed. These include:  Canes.  Walkers.  Scooters.  Crutches.  Turn on the lights when you go into a dark area. Replace any light bulbs as soon as they burn out.  Set up your furniture so you have a clear path. Avoid moving your furniture around.  If any of your floors are uneven, fix them.  If there are any pets around you, be aware of where they are.  Review your medicines with your doctor. Some medicines can make you feel dizzy. This can increase your chance of falling. Ask your doctor what other things that you can do to help prevent falls. This information is not intended to replace advice given to you by your health care provider. Make sure you discuss any questions you have with your health care provider. Document Released: 01/03/2009 Document Revised: 08/15/2015 Document Reviewed: 04/13/2014 Elsevier Interactive Patient Education  2017 [employer].

## 2019-02-06 NOTE — Progress Notes (Signed)
Pt called and notified

## 2019-02-06 NOTE — Progress Notes (Signed)
I have reviewed and agree with note, evaluation, plan.  See my separate note from today  Amelio Brosky, MD  

## 2019-02-06 NOTE — Progress Notes (Signed)
I connected with Gretta Arab on 02/06/19 at 1115 by phone and verified that I am speaking with the correct person using two identifiers. Location patient: Home Location provider: [facility], Office Persons participating in the virtual visit: Denman George LPN and Dr. [name]    I discussed the limitations of evaluation and management by telemedicine and the availability of in person appointments. The patient expressed understanding and agreed to proceed.  Subjective:   Joshua [name] is a 78 y.o. male who presents for Medicare Annual/Subsequent preventive examination.  Review of Systems:   Cardiac Risk Factors include: advanced age (>37men, >75 women);male gender;hypertension;sedentary lifestyle    Objective:    Vitals: BP 130/64 (BP Location: Right Arm)   Temp 98 F (36.7 C)   Ht 5\' 7"  (1.702 m)   Wt 211 lb 13.8 oz (96.1 kg)   BMI 33.18 kg/m   Body mass index is 33.18 kg/m.  Advanced Directives 02/06/2019 01/02/2019 12/28/2018 09/14/2018 08/05/2017 01/20/2017 11/12/2016  Does Patient Have a Medical Advance Directive? Yes Yes Yes Yes Yes Yes No;Yes  Type of Advance Directive Living will;Healthcare Power of [place];Living will [place];Living will [place];Living will - [place];Living will -  Does patient want to make changes to medical advance directive? No - Patient declined No - Patient declined No - Patient declined - - No - Patient declined -  Copy of [place] in Chart? No - copy requested No - copy requested (No Data) - - No - copy requested -  Would patient like information on creating a medical advance directive? - - - - - - -    Tobacco Social History   Tobacco Use  Smoking Status Former Smoker  . Packs/day: 1.00  . Years: 35.00  . Pack years: 35.00  . Types: Cigarettes  . Quit date: 06/16/1992  . Years since quitting: 26.6  Smokeless  Tobacco Never Used     Counseling given: Not Answered   Clinical Intake:  Pre-visit preparation completed: Yes  Pain : 0-10 Pain Score: 4  Pain Type: Acute pain Pain Location: Back Pain Onset: More than a month ago Pain Frequency: Intermittent  Diabetes: No  How often do you need to have someone help you when you read instructions, pamphlets, or other written materials from your doctor or pharmacy?: 1 - Never  Interpreter Needed?: No  Information entered by :: Denman George LPN  Past Medical History:  Diagnosis Date  . Acute medial meniscal tear   . Anemia   . Arthritis    "middle finger right hand; right knee; neck" (02/06/2014)  . Asthma   . Bladder cancer ([place]) 04/2010   "cauterized during prostate OR"  . CAD (coronary artery disease)    CABG 2001  . Depression   . Diverticulosis   . DJD (degenerative joint disease)    BACK  . GERD (gastroesophageal reflux disease)   . History of gout   . Hyperlipidemia   . Hypertension   . IBS (irritable bowel syndrome)   . Obesity   . OSA (obstructive sleep apnea)    USES CPAP   . Pancreatitis ~ 1980  . Prostate cancer ([place]) 04/2010   Past Surgical History:  Procedure Laterality Date  . ANTERIOR CERVICAL DECOMP/DISCECTOMY FUSION  05/26/2006   PLATE PLACED  . CARDIAC CATHETERIZATION  11/20/1999  . CORONARY ARTERY BYPASS GRAFT  11/20/1999   SVG-RI1-RI2, SVG-OM, SVG-dRCA  . INGUINAL  HERNIA REPAIR Left 01/02/2019   Procedure: OPEN LEFT INGUINAL HERNIA REPAIR WITH MESH;  Surgeon: Johnathan Hausen, MD;  Location: [facility];  Service: General;  Laterality: Left;  . KNEE ARTHROSCOPY Right 1992; 04/2008  . LAPAROSCOPIC CHOLECYSTECTOMY  2007  . NASAL SEPTUM SURGERY Right 2007  . ROBOT ASSISTED LAPAROSCOPIC RADICAL PROSTATECTOMY  04/2010  . THYROIDECTOMY, PARTIAL  1980   Family History  Problem Relation Age of Onset  . Heart disease Mother   . Hyperlipidemia Mother   . Heart disease Father   . Hyperlipidemia Father   . Diabetes  Brother    Social History   Socioeconomic History  . Marital status: Married    Spouse name: Not on file  . Number of children: Not on file  . Years of education: Not on file  . Highest education level: Not on file  Occupational History  . Occupation: retired    Fish farm manager: RETIRED  Social Needs  . Financial resource strain: Not on file  . Food insecurity    Worry: Not on file    Inability: Not on file  . Transportation needs    Medical: Not on file    Non-medical: Not on file  Tobacco Use  . Smoking status: Former Smoker    Packs/day: 1.00    Years: 35.00    Pack years: 35.00    Types: Cigarettes    Quit date: 06/16/1992    Years since quitting: 26.6  . Smokeless tobacco: Never Used  Substance and Sexual Activity  . Alcohol use: Yes    Alcohol/week: 7.0 standard drinks    Types: 7 Shots of liquor per week    Comment: mixed drink Q DAY  . Drug use: No  . Sexual activity: Not on file  Lifestyle  . Physical activity    Days per week: Not on file    Minutes per session: Not on file  . Stress: Not on file  Relationships  . Social Herbalist on phone: Not on file    Gets together: Not on file    Attends religious service: Not on file    Active member of club or organization: Not on file    Attends meetings of clubs or organizations: Not on file    Relationship status: Not on file  Other Topics Concern  . Not on file  Social History Narrative   Married 42 years in 2015. No kids (mumps at age 60)      Retired from [employer] in Engineer, technical sales   Volunteers at [place]: poker, movies and tv, staying active      Right handed   2 level home with steps he uses    Outpatient Encounter Medications as of 02/06/2019  Medication Sig  . acetaminophen (TYLENOL) 500 MG tablet Take 1,000 mg by mouth every 6 (six) hours as needed for moderate pain or headache.   . albuterol (PROAIR HFA) 108 (90 BASE) MCG/ACT inhaler Inhale 1-2 puffs into the lungs every 6 (six)  hours as needed for wheezing or shortness of breath.  [name] aspirin EC 81 MG tablet Take 81 mg by mouth daily.  . diazepam (VALIUM) 5 MG tablet Take 1 tablet (5 mg total) by mouth at bedtime as needed.  . Evolocumab (REPATHA SURECLICK) [id number] MG/ML SOAJ Inject 140 mg into the skin every 14 (fourteen) days.  [name] FLOVENT HFA 110 MCG/ACT inhaler Inhale 2 puffs into the lungs 2 (two) times daily. Reported on 03/22/2015  .  fluticasone (FLONASE) 50 MCG/ACT nasal spray Place 1 spray into both nostrils 2 (two) times daily.   . folic acid (FOLVITE) Q000111Q MCG tablet Take 800 mcg by mouth daily.   [name] gabapentin (NEURONTIN) 100 MG capsule Take 2 capsules in the morning, 2 capsules in the afternoon and 6 capsules at bedtime (Patient taking differently: Take 200-600 mg by mouth See admin instructions. Take 200 mg by mouth in the morning,200 mg in the afternoon and 600 mg at bedtime)  . Glucosamine-Chondroitin (MOVE FREE PO) Take 1 tablet by mouth daily.  . hydrochlorothiazide (HYDRODIURIL) 25 MG tablet TAKE 1 TABLET BY MOUTH EVERY DAY (Patient taking differently: Take 25 mg by mouth daily. )  . HYDROcodone-acetaminophen (NORCO/VICODIN) 5-325 MG tablet Take 1 tablet by mouth every 6 (six) hours as needed for moderate pain.  [name] loratadine (CLARITIN) 10 MG tablet Take 10 mg by mouth at bedtime.   [name] losartan (COZAAR) 100 MG tablet TAKE 1 TABLET BY MOUTH EVERY DAY (Patient taking differently: Take 100 mg by mouth at bedtime. )  . miconazole (MICOTIN) 2 % powder Apply topically as needed for itching. TO GROIN AREA CROTCH ITCH AREA RED NO DRAINAGE  . nystatin (MYCOSTATIN) 100000 UNIT/ML suspension Take 5 mLs (500,000 Units total) by mouth 4 (four) times daily. (Patient not taking: Reported on 12/23/2018)  . omeprazole (PRILOSEC) 20 MG capsule TAKE 1 CAPSULE BY MOUTH EVERY DAY  . ondansetron (ZOFRAN ODT) 8 MG disintegrating tablet Take 1 tablet (8 mg total) by mouth every 8 (eight) hours as needed for nausea or vomiting.  .  polyethylene glycol powder (GLYCOLAX/MIRALAX) powder Take 17 g by mouth daily. (Patient taking differently: Take 17 g by mouth 2 (two) times daily. )  . Probiotic Product (ALIGN) 4 MG CAPS Take 4 mg by mouth daily.   . simvastatin (ZOCOR) 20 MG tablet TAKE 1 TABLET BY MOUTH EVERYDAY AT BEDTIME (Patient taking differently: Take 20 mg by mouth at bedtime. )  . traMADol (ULTRAM) 50 MG tablet Take 1 tablet (50 mg total) by mouth every 6 (six) hours as needed.  . vitamin B-12 (CYANOCOBALAMIN) 1000 MCG tablet Take 1,000 mcg by mouth daily.  [name] VITAMIN D PO Take 4,000 Units by mouth daily.    No facility-administered encounter medications on file as of 02/06/2019.     Activities of Daily Living In your present state of health, do you have any difficulty performing the following activities: 02/06/2019 12/28/2018  Hearing? N Y  Comment - HOH HEARING AIDES BOTH EARS  Vision? N N  Difficulty concentrating or making decisions? N N  Walking or climbing stairs? Y N  Comment due to back pain -  Dressing or bathing? N N  Doing errands, shopping? N N  Preparing Food and eating ? N -  Using the Toilet? N -  In the past six months, have you accidently leaked urine? N -  Do you have problems with loss of bowel control? N -  Managing your Medications? N -  Managing your Finances? N -  Housekeeping or managing your Housekeeping? N -  Some recent data might be hidden    Patient Care Team: [name], MD as PCP - General (Family Medicine) [name], MD as PCP - Cardiology (Cardiology) Gerda Diss, DO as Consulting Physician (Sports Medicine) Cameron Sprang, MD as Consulting Physician (Neurology) Chesley Mires, MD as Consulting Physician (Pulmonary Disease) Lyndal Pulley, DO as Consulting Physician (Sports Medicine) Tiajuana Amass, MD as Consulting Physician (Allergy  and Immunology)   Assessment:   This is a routine wellness examination for Joshua [name].  Exercise Activities and Dietary  recommendations Current Exercise Habits: The patient does not participate in regular exercise at present  Goals    . Exercise 150 minutes per week (moderate activity)     To get back to the fitness center  \    . Weight (lb) < 190 lb (86.2 kg)     Keep cutting back on portions Keep exercising        Fall Risk Fall Risk  02/06/2019 11/02/2018 09/14/2018 02/02/2018 01/25/2018  Falls in the past year? 0 1 1 0 0  Comment - - - - -  Number falls in past yr: 0 0 1 - 0  Injury with Fall? 0 1 0 - 0  Risk for fall due to : - - Impaired balance/gait - -   Is the patient's home free of loose throw rugs in walkways, pet beds, electrical cords, etc?   yes      Grab bars in the bathroom? yes      Handrails on the stairs?   yes      Adequate lighting?   yes   Depression Screen PHQ 2/9 Scores 02/06/2019 11/02/2018 05/05/2018 02/02/2018  PHQ - 2 Score 1 1 0 1  PHQ- 9 Score 2 6 0 7    Cognitive Function- no cognitive concerns at this time  MMSE - Mini Mental State Exam 08/05/2017 05/27/2016  Not completed: (No Data) (No Data)   Montreal Cognitive Assessment  09/25/2014  Visuospatial/ Executive (0/5) 5  Naming (0/3) 3  Attention: Read list of digits (0/2) 2  Attention: Read list of letters (0/1) 1  Attention: Serial 7 subtraction starting at 100 (0/3) 3  Language: Repeat phrase (0/2) 2  Language : Fluency (0/1) 1  Abstraction (0/2) 2  Delayed Recall (0/5) 3  Orientation (0/6) 6  Total 28  Adjusted Score (based on education) 28   6CIT Screen 02/06/2019  What Year? 0 points  What month? 0 points  What time? 0 points  Count back from 20 0 points  Months in reverse 0 points  Repeat phrase 0 points  Total Score 0    Immunization History  Administered Date(s) Administered  . Fluad Quad(high Dose 65+) 11/21/2018  . Influenza Split 01/08/2012, 11/12/2014  . Influenza Whole 12/08/2007, 12/26/2008, 12/22/2010  . Influenza, High Dose Seasonal PF 11/16/2015, 01/01/2017  .  Influenza,inj,Quad PF,6+ Mos 11/30/2012  . Influenza-Unspecified 01/12/2014, 12/21/2017  . Pneumococcal Conjugate-13 05/31/2014  . Pneumococcal Polysaccharide-23 06/04/2006, 07/15/2012  . Td 04/23/2005  . Tdap 02/18/2016  . Zoster 01/08/2012  . Zoster Recombinat (Shingrix) 01/31/2018, 04/02/2018    Qualifies for Shingles Vaccine? Shingrix completed   Screening Tests Health Maintenance  Topic Date Due  . COLONOSCOPY  12/13/2018  . TETANUS/TDAP  02/17/2026  . INFLUENZA VACCINE  Completed  . PNA vac Low Risk Adult  Completed   Cancer Screenings: Lung: Low Dose CT Chest recommended if Age 5-80 years, 30 pack-year currently smoking OR have quit w/in 15years. Patient does not qualify. Colorectal: Cologuard ordered today; last colonoscopy 12/12/08     Plan:  I have personally reviewed and addressed the Medicare Annual Wellness questionnaire and have noted the following in the patient's chart:  A. Medical and social history B. Use of alcohol, tobacco or illicit drugs  C. Current medications and supplements D. Functional ability and status E.  Nutritional status F.  Physical activity G. Advance directives H.  List of other physicians I.  Hospitalizations, surgeries, and ER visits in previous 12 months J.  [place] such as hearing and vision if needed, cognitive and depression L. Referrals, records requested, and appointments- none   In addition, I have reviewed and discussed with patient certain preventive protocols, quality metrics, and best practice recommendations. A written personalized care plan for preventive services as well as general preventive health recommendations were provided to patient.   Signed,  Denman George, LPN  Nurse Health Advisor   Nurse Notes: no additional

## 2019-02-06 NOTE — Progress Notes (Signed)
Phone: [phone]   Subjective:  Patient presents today for their annual physical. Chief complaint-noted.   See problem oriented charting- ROS- full  review of systems was completed and negative  except for: hearing loss, tinnitus, far sighted, cold intolerance, genital soreness with recent surgery, chronic penile pain, testicular pain - even before surgery and improving, joint pain, back pain, gait issues due to pain, muscle aches, neck stinffness, seasonal allergies, dizziness, headaches, weakness, bruising, sad mood  The following were reviewed and entered/updated in epic: Past Medical History:  Diagnosis Date   Acute medial meniscal tear    Anemia    Arthritis    "middle finger right hand; right knee; neck" (02/06/2014)   Asthma    Bladder cancer ([place]) 04/2010   "cauterized during prostate OR"   CAD (coronary artery disease)    CABG 2001   Depression    Diverticulosis    DJD (degenerative joint disease)    BACK   GERD (gastroesophageal reflux disease)    History of gout    Hyperlipidemia    Hypertension    IBS (irritable bowel syndrome)    Obesity    OSA (obstructive sleep apnea)    USES CPAP    Pancreatitis ~ 1980   Prostate cancer ([place]) 04/2010   Patient Active Problem List   Diagnosis Date Noted   Coronary atherosclerosis 10/12/2006    Priority: High   Irritable bowel syndrome 10/12/2006    Priority: High   Hyperglycemia 10/07/2017    Priority: Medium   Gout 11/20/2016    Priority: Medium   CKD (chronic kidney disease), stage III ([place]) 10/23/2013    Priority: Medium   Neuropathy ([place]) 08/21/2013    Priority: Medium   History of bladder cancer 04/01/2010    Priority: Medium   Essential hypertension 02/21/2007    Priority: Medium   Hyperlipidemia 10/12/2006    Priority: Medium   Depression, major, single episode, in partial remission ([place]) 10/12/2006    Priority: Medium   Asthma 10/12/2006    Priority: Medium   Aortic  atherosclerosis ([place]) 05/05/2018    Priority: Low   Senile purpura ([place]) 10/07/2017    Priority: Low   DJD (degenerative joint disease)     Priority: Low   Arthritis     Priority: Low   Neck pain 02/08/2017    Priority: Low   Venous insufficiency 12/29/2016    Priority: Low   Ganglion cyst of tendon sheath of right hand 12/29/2016    Priority: Low   Avulsion fracture of metatarsal bone of right foot 11/25/2016    Priority: Low   Obesity, Class I, BMI 30-34.9 10/03/2015    Priority: Low   Allergic rhinitis 05/31/2014    Priority: Low   Fatigue 11/09/2013    Priority: Low   Dizziness 08/23/2013    Priority: Low   RLS (restless legs syndrome) 01/03/2013    Priority: Low   Chronic bilateral low back pain with left-sided sciatica 10/09/2011    Priority: Low   OSTEOARTHRITIS, LOWER LEG, LEFT 10/04/2009    Priority: Low   TOBACCO ABUSE, HX OF 12/26/2008    Priority: Low   ANEMIA, IRON DEFICIENCY 11/06/2008    Priority: Low   TINNITUS, CHRONIC, 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    Priority: Low   OSA (obstructive sleep apnea) 11/02/2006    Priority: Low   GERD 10/12/2006    Priority: Low   Greater trochanteric bursitis of left hip 10/27/2018   Degenerative disc disease, lumbar 10/27/2018   Abdominal  muscle strain, initial encounter 06/16/2018   Exertional angina ([place]) 04/18/2018   Weakness of left leg 06/21/2017   Hereditary and idiopathic peripheral neuropathy 09/25/2014   Past Surgical History:  Procedure Laterality Date   ANTERIOR CERVICAL DECOMP/DISCECTOMY FUSION  05/26/2006   PLATE PLACED   CARDIAC CATHETERIZATION  11/20/1999   CORONARY ARTERY BYPASS GRAFT  11/20/1999   SVG-RI1-RI2, SVG-OM, SVG-dRCA   INGUINAL HERNIA REPAIR Left 01/02/2019   Procedure: OPEN LEFT INGUINAL HERNIA REPAIR WITH MESH;  Surgeon: Johnathan Hausen, MD;  Location: [facility];  Service: General;  Laterality: Left;   KNEE ARTHROSCOPY Right 1992; 04/2008   LAPAROSCOPIC  CHOLECYSTECTOMY  2007   NASAL SEPTUM SURGERY Right 2007   ROBOT ASSISTED LAPAROSCOPIC RADICAL PROSTATECTOMY  04/2010   THYROIDECTOMY, PARTIAL  1980    Family History  Problem Relation Age of Onset   Heart disease Mother    Hyperlipidemia Mother    Heart disease Father    Hyperlipidemia Father    Diabetes Brother     Medications- reviewed and updated Current Outpatient Medications  Medication Sig Dispense Refill   acetaminophen (TYLENOL) 500 MG tablet Take 1,000 mg by mouth every 6 (six) hours as needed for moderate pain or headache.      albuterol (PROAIR HFA) 108 (90 BASE) MCG/ACT inhaler Inhale 1-2 puffs into the lungs every 6 (six) hours as needed for wheezing or shortness of breath. 18 g 3   aspirin EC 81 MG tablet Take 81 mg by mouth daily.     diazepam (VALIUM) 5 MG tablet Take 1 tablet (5 mg total) by mouth at bedtime as needed. 30 tablet 1   Evolocumab (REPATHA SURECLICK) [id number] MG/ML SOAJ Inject 140 mg into the skin every 14 (fourteen) days. 2 pen 11   FLOVENT HFA 110 MCG/ACT inhaler Inhale 2 puffs into the lungs 2 (two) times daily. Reported on 03/22/2015  5   fluticasone (FLONASE) 50 MCG/ACT nasal spray Place 1 spray into both nostrils 2 (two) times daily.      folic acid (FOLVITE) Q000111Q MCG tablet Take 800 mcg by mouth daily.      gabapentin (NEURONTIN) 100 MG capsule Take 2 capsules in the morning, 2 capsules in the afternoon and 6 capsules at bedtime (Patient taking differently: Take 200-600 mg by mouth See admin instructions. Take 200 mg by mouth in the morning,200 mg in the afternoon and 600 mg at bedtime) 900 capsule 3   Glucosamine-Chondroitin (MOVE FREE PO) Take 1 tablet by mouth daily.     hydrochlorothiazide (HYDRODIURIL) 25 MG tablet TAKE 1 TABLET BY MOUTH EVERY DAY (Patient taking differently: Take 25 mg by mouth daily. ) 90 tablet 3   HYDROcodone-acetaminophen (NORCO/VICODIN) 5-325 MG tablet Take 1 tablet by mouth every 6 (six) hours as needed for  moderate pain. 15 tablet 0   loratadine (CLARITIN) 10 MG tablet Take 10 mg by mouth at bedtime.      losartan (COZAAR) 100 MG tablet TAKE 1 TABLET BY MOUTH EVERY DAY (Patient taking differently: Take 100 mg by mouth at bedtime. ) 90 tablet 3   meloxicam (MOBIC) 7.5 MG tablet Take 1 tablet (7.5 mg total) by mouth daily as needed for pain. (Patient taking differently: Take 7.5 mg by mouth daily. ) 30 tablet 0   miconazole (MICOTIN) 2 % powder Apply topically as needed for itching. TO GROIN AREA CROTCH ITCH AREA RED NO DRAINAGE     nystatin (MYCOSTATIN) 100000 UNIT/ML suspension Take 5 mLs (500,000 Units total) by mouth 4 (  four) times daily. (Patient not taking: Reported on 12/23/2018) 60 mL 0   omeprazole (PRILOSEC) 20 MG capsule TAKE 1 CAPSULE BY MOUTH EVERY DAY 90 capsule 1   ondansetron (ZOFRAN ODT) 8 MG disintegrating tablet Take 1 tablet (8 mg total) by mouth every 8 (eight) hours as needed for nausea or vomiting. 20 tablet 0   polyethylene glycol powder (GLYCOLAX/MIRALAX) powder Take 17 g by mouth daily. (Patient taking differently: Take 17 g by mouth 2 (two) times daily. ) 255 g 0   Probiotic Product (ALIGN) 4 MG CAPS Take 4 mg by mouth daily.      simvastatin (ZOCOR) 20 MG tablet TAKE 1 TABLET BY MOUTH EVERYDAY AT BEDTIME (Patient taking differently: Take 20 mg by mouth at bedtime. ) 90 tablet 3   traMADol (ULTRAM) 50 MG tablet Take 1 tablet (50 mg total) by mouth every 6 (six) hours as needed. 20 tablet 1   vitamin B-12 (CYANOCOBALAMIN) 1000 MCG tablet Take 1,000 mcg by mouth daily.     VITAMIN D PO Take 4,000 Units by mouth daily.      No current facility-administered medications for this visit.     Allergies-reviewed and updated Allergies  Allergen Reactions   Benadryl [Diphenhydramine] Other (See Comments)    Pt told not to take because of bypass surgery   Bromfed Nausea And Vomiting   Clarithromycin Other (See Comments)     gastritis   Codeine Other (See Comments)     Trouble breathing   Doxycycline Hyclate Nausea And Vomiting   Modafinil Other (See Comments)     anxiety-nervousness   Oxycodone-Acetaminophen Itching   Promethazine Hcl Other (See Comments)     fainting   Quinolones     Cipro   Telithromycin Nausea And Vomiting   Hydrocodone-Acetaminophen Rash   Sulfonamide Derivatives Rash    Social History   Social History Narrative   Married 42 years in 2015. No kids (mumps at age 74)      Retired from [employer] in Engineer, technical sales   Volunteers at [employer]: poker, movies and tv, staying active      Right handed   2 level home with steps he uses   Objective  Objective:  BP 130/64    Pulse 80    Temp 98 F (36.7 C)    Ht 5\' 7"  (1.702 m)    Wt 211 lb 12.8 oz (96.1 kg)    SpO2 96%    BMI 33.17 kg/m  Gen: NAD, resting comfortably HEENT: Mask not removed due to covid 19. TM normal. Bridge of nose normal. Eyelids normal.  Neck: no thyromegaly or cervical lymphadenopathy  CV: RRR no murmurs rubs or gallops Lungs: CTAB no crackles, wheeze, rhonchi Abdomen: soft/nontender/nondistended/normal bowel sounds. No rebound or guarding.  Ext: no edema Skin: warm, dry Neuro: grossly normal, moves all extremities, PERRLA    Assessment and Plan  78 y.o. male presenting for annual physical.  Health Maintenance counseling: 1. Anticipatory guidance: Patient counseled regarding regular dental exams yes q6 months, eye exams yes yearly,  avoiding smoking and second hand smoke-yes , limiting alcohol to 1 beverages per day -yes.   2. Risk factor reduction:  Advised patient of need for regular exercise and diet rich and fruits and vegetables to reduce risk of heart attack and stroke. Exercise- not able to due to back pain and testicle pain. Diet-pt states is terrible- does the best he can with limitations including extreme aversion to vegetables and  fish Wt Readings from Last 3 Encounters:  02/06/19 211 lb 12.8 oz (96.1 kg)  12/28/18 214 lb 3.2 oz  (97.2 kg)  11/29/18 212 lb (96.2 kg)  3. Immunizations/screenings/ancillary studies- up to date  Immunization History  Administered Date(s) Administered   Fluad Quad(high Dose 65+) 11/21/2018   Influenza Split 01/08/2012, 11/12/2014   Influenza Whole 12/08/2007, 12/26/2008, 12/22/2010   Influenza, High Dose Seasonal PF 11/16/2015, 01/01/2017   Influenza,inj,Quad PF,6+ Mos 11/30/2012   Influenza-Unspecified 01/12/2014, 12/21/2017   Pneumococcal Conjugate-13 05/31/2014   Pneumococcal Polysaccharide-23 06/04/2006, 07/15/2012   Td 04/23/2005   Tdap 02/18/2016   Zoster 01/08/2012   Zoster Recombinat (Shingrix) 01/31/2018, 04/02/2018  4. Prostate cancer - History of bladder cancer and prostate cancer-follows with urology - he has been released- he wants to discontinue PSA checks   Lab Results  Component Value Date   PSA <0.1 10/01/2017   PSA 0.00 (L) 04/06/2012   PSA 1.35 02/18/2010   5. Colon cancer screening - needs to have one- agrees to cologuard 6. Skin cancer screening- yearly checks with derm. advised regular sunscreen use. Denies worrisome, changing, or new skin lesions.  7. former Smoker- quit in 1990s  Status of chronic or acute concerns   #CAD/hyperlipidemia-history of CABG x5- follows with Dr. Percival Spanish S: Compliant with Repatha and simvastatin for lipids.  He is compliant with aspirin as well.   -Most recent stress test April 26, 2018-no evidence of ischemia Lab Results  Component Value Date   CHOL 96 11/03/2018   HDL 46.20 11/03/2018   LDLCALC 18 11/03/2018   LDLDIRECT 115.0 06/04/2016   TRIG 158.0 (H) 11/03/2018   CHOLHDL 2 11/03/2018   A/P: doing well- routing to Dr. Yong Channel.  "Dr. Percival Spanish,  Patient has done really well on repatha with LDL down to 18, he feels like may have some myalgias from statins and was wondering if he could stop simvastatin. I told him I thought we likely could but didn't want to make unilateral decision since he is also your  patient.  Thanks, [name]"    #Hypertension/CKD stage III S: Compliant with hydrochlorothiazide 25 mg and losartan 100 mg -GFR has been stable.  On ARB in case proteinuric element A/P: doing well recently- most recent #s honestly in stage II range- hopeful stays here but continue to monitor -Removed meloxicam from his list in light of CKD stage III  -did not tolerate trial off PPI and on h2 blocker   #Depression, major, single episode-in remission S: Patient has not tolerated medication in the past-feels too tired and sleepy on SSRIs -Has declined counseling and continues to decline counseling-has done this in the past and only somewhat helpful. -Most recently seems to be doing reasonably well Depression screen [facility] 2/9 02/06/2019 11/02/2018 05/05/2018  Decreased Interest 0 0 0  Down, Depressed, Hopeless 1 1 0  PHQ - 2 Score 1 1 0  Altered sleeping 0 1 0  Tired, decreased energy 1 2 0  Change in appetite 0 2 0  Feeling bad or failure about yourself  0 0 0  Trouble concentrating 0 0 0  Moving slowly or fidgety/restless 0 0 0  Suicidal thoughts 0 0 0  PHQ-9 Score 2 6 0  Difficult doing work/chores Not difficult at all Not difficult at all Not difficult at all  Some recent data might be hidden  A/P: Surprisingly despite COVID-19 and recent surgery- depression appears to be in full remission-congratulated patient -Sparing Valium for sleep   % Neuropathy-follows  with Dr. Amparo Bristol: Compliant with gabapentin.  Burning/needle pain in the feet  A/P: stable- continue to follow with neurology   #Hyperglycemia history of mildly elevated A1c.  Prediabetes range. -we opted to defer until next bloodwork as had done right at 3 months    Lab Results  Component Value Date   HGBA1C 5.8 11/03/2018   HGBA1C 5.7 05/05/2018   HGBA1C 5.8 (H) 10/01/2017   #Aortic atherosclerosis-Noted on CT 2016.  Likely stable.  Continue risk factor modification  #Acute idiopathic gout of right foot-no  recent flares-mildly above goal but with no flare ups opted to remain off medicine Lab Results  Component Value Date   LABURIC 6.3 10/27/2018    #IBS-doing reasonably well recently- had been worse after surgery- more noise/gas   #Moderate persistent asthma without complication-doing well recentlyon Flovent  #Left inguinal hernia-still with some left testicular pain- tylenol doesn't help. He uses tramadol as needed.  Feels like today doing better but seems to still come and go despite his surgery. Sometimes prolonged sitting will get better.   Recommended follow up: 4-6 month follow up  Future Appointments  Date Time Provider [place]  02/06/2019 11:30 AM Bernita Raisin, LPN [facility]  [id number]  2:00 PM Lyndal Pulley, DO [facility]  03/06/2019 11:30 AM Cameron Sprang, MD [facility] None   Lab/Order associations: fasting   ICD-10-CM   1. Preventative health care  Z00.00   2. Hyperlipidemia, unspecified hyperlipidemia type  E78.5   3. Hyperglycemia  R73.9   4. Atherosclerosis of native coronary artery of native heart without angina pectoris  I25.10   5. Irritable bowel syndrome, unspecified type  K58.9   6. Neuropathy ([place])  G62.9   7. History of bladder cancer  Z85.51   8. Acute idiopathic gout of right foot  M10.071   9. Essential hypertension  I10   10. Depression, major, single episode, in partial remission ([place])  F32.4   11. Stage 3 chronic kidney disease, unspecified whether stage 3a or 3b CKD  N18.30   12. Moderate persistent asthma without complication  123456     No orders of the defined types were placed in this encounter.   Return precautions advised.  [name], MD

## 2019-02-06 NOTE — Patient Instructions (Addendum)
Health Maintenance Due  Topic Date Due  . COLONOSCOPY -team please print off cologuard and send to them. Also give him a handout with the # to check with cologuard about insurance coverage 12/13/2018   Recommended follow up: 4-6 month follow up or sooner if needed  No changes today  No labs today- go get some good healthy food!

## 2019-02-06 NOTE — Progress Notes (Signed)
OK to stop Zocor

## 2019-02-09 ENCOUNTER — Other Ambulatory Visit: Payer: Self-pay

## 2019-02-10 ENCOUNTER — Ambulatory Visit: Payer: PPO | Admitting: Family Medicine

## 2019-02-10 ENCOUNTER — Ambulatory Visit (INDEPENDENT_AMBULATORY_CARE_PROVIDER_SITE_OTHER): Payer: PPO | Admitting: Family Medicine

## 2019-02-10 ENCOUNTER — Encounter: Payer: Self-pay | Admitting: Family Medicine

## 2019-02-10 VITALS — BP 132/74 | HR 88 | Temp 97.7°F | Ht 67.0 in | Wt 215.0 lb

## 2019-02-10 DIAGNOSIS — R300 Dysuria: Secondary | ICD-10-CM | POA: Diagnosis not present

## 2019-02-10 LAB — POC URINALSYSI DIPSTICK (AUTOMATED)
Bilirubin, UA: NEGATIVE
Blood, UA: NEGATIVE
Glucose, UA: NEGATIVE
Ketones, UA: NEGATIVE
Leukocytes, UA: NEGATIVE
Nitrite, UA: NEGATIVE
Protein, UA: NEGATIVE
Spec Grav, UA: 1.015 (ref 1.010–1.025)
Urobilinogen, UA: 0.2 E.U./dL
pH, UA: 6 (ref 5.0–8.0)

## 2019-02-10 MED ORDER — CEPHALEXIN 500 MG PO CAPS: 500.0000 mg | ORAL_CAPSULE | Freq: Three times a day (TID) | ORAL | 0 refills | Status: AC

## 2019-02-10 NOTE — Progress Notes (Signed)
Phone [phone] In person visit   Subjective:   Joshua [name] is a 78 y.o. year old very pleasant male patient who presents for/with See problem oriented charting Chief Complaint  Patient presents with  . Urinary Tract Infection   ROS- see ROS embedded below   This visit occurred during the SARS-CoV-2 public health emergency.  Safety protocols were in place, including screening questions prior to the visit, additional usage of staff PPE, and extensive cleaning of exam room while observing appropriate contact time as indicated for disinfecting solutions.   Past Medical History-  Patient Active Problem List   Diagnosis Date Noted  . Coronary atherosclerosis 10/12/2006    Priority: High  . Irritable bowel syndrome 10/12/2006    Priority: High  . Hyperglycemia 10/07/2017    Priority: Medium  . Gout 11/20/2016    Priority: Medium  . CKD (chronic kidney disease), stage III ([place]) 10/23/2013    Priority: Medium  . Neuropathy ([place]) 08/21/2013    Priority: Medium  . History of bladder cancer 04/01/2010    Priority: Medium  . Essential hypertension 02/21/2007    Priority: Medium  . Hyperlipidemia 10/12/2006    Priority: Medium  . Depression, major, single episode, in partial remission ([place]) 10/12/2006    Priority: Medium  . Asthma 10/12/2006    Priority: Medium  . Aortic atherosclerosis ([place]) 05/05/2018    Priority: Low  . Senile purpura ([place]) 10/07/2017    Priority: Low  . DJD (degenerative joint disease)     Priority: Low  . Arthritis     Priority: Low  . Neck pain 02/08/2017    Priority: Low  . Venous insufficiency 12/29/2016    Priority: Low  . Ganglion cyst of tendon sheath of right hand 12/29/2016    Priority: Low  . Avulsion fracture of metatarsal bone of right foot 11/25/2016    Priority: Low  . Obesity, Class I, BMI 30-34.9 10/03/2015    Priority: Low  . Allergic rhinitis 05/31/2014    Priority: Low  . Fatigue 11/09/2013    Priority: Low  .  Dizziness 08/23/2013    Priority: Low  . RLS (restless legs syndrome) 01/03/2013    Priority: Low  . Chronic bilateral low back pain with left-sided sciatica 10/09/2011    Priority: Low  . OSTEOARTHRITIS, LOWER LEG, LEFT 10/04/2009    Priority: Low  . TOBACCO ABUSE, HX OF 12/26/2008    Priority: Low  . ANEMIA, IRON DEFICIENCY 11/06/2008    Priority: Low  . 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 OSA (obstructive sleep apnea) 11/02/2006    Priority: Low  . GERD 10/12/2006    Priority: Low  . Greater trochanteric bursitis of left hip 10/27/2018  . Degenerative disc disease, lumbar 10/27/2018  . Abdominal muscle strain, initial encounter 06/16/2018  . Exertional angina ([place]) 04/18/2018  . Weakness of left leg 06/21/2017  . Hereditary and idiopathic peripheral neuropathy 09/25/2014    Medications- reviewed and updated Current Outpatient Medications  Medication Sig Dispense Refill  . acetaminophen (TYLENOL) 500 MG tablet Take 1,000 mg by mouth every 6 (six) hours as needed for moderate pain or headache.     . albuterol (PROAIR HFA) 108 (90 BASE) MCG/ACT inhaler Inhale 1-2 puffs into the lungs every 6 (six) hours as needed for wheezing or shortness of breath. 18 g 3  . aspirin EC 81 MG tablet Take 81 mg by mouth daily.    . diazepam (VALIUM) 5 MG tablet Take  1 tablet (5 mg total) by mouth at bedtime as needed. 30 tablet 1  . Evolocumab (REPATHA SURECLICK) [id number] MG/ML SOAJ Inject 140 mg into the skin every 14 (fourteen) days. 2 pen 11  . FLOVENT HFA 110 MCG/ACT inhaler Inhale 2 puffs into the lungs 2 (two) times daily. Reported on 03/22/2015  5  . fluticasone (FLONASE) 50 MCG/ACT nasal spray Place 1 spray into both nostrils 2 (two) times daily.     . folic acid (FOLVITE) Q000111Q MCG tablet Take 800 mcg by mouth daily.     [name] gabapentin (NEURONTIN) 100 MG capsule Take 2 capsules in the morning, 2 capsules in the afternoon and 6 capsules at bedtime (Patient taking  differently: Take 200-600 mg by mouth See admin instructions. Take 200 mg by mouth in the morning,200 mg in the afternoon and 600 mg at bedtime) 900 capsule 3  . Glucosamine-Chondroitin (MOVE FREE PO) Take 1 tablet by mouth daily.    . hydrochlorothiazide (HYDRODIURIL) 25 MG tablet TAKE 1 TABLET BY MOUTH EVERY DAY (Patient taking differently: Take 25 mg by mouth daily. ) 90 tablet 3  . ketoconazole (NIZORAL) 2 % cream APPLY TO AFFECTED AREA(S) TWICE A DAY WHEN FLARED    . loratadine (CLARITIN) 10 MG tablet Take 10 mg by mouth at bedtime.     [name] losartan (COZAAR) 100 MG tablet TAKE 1 TABLET BY MOUTH EVERY DAY (Patient taking differently: Take 100 mg by mouth at bedtime. ) 90 tablet 3  . miconazole (MICOTIN) 2 % powder Apply topically as needed for itching. TO GROIN AREA CROTCH ITCH AREA RED NO DRAINAGE    . omeprazole (PRILOSEC) 20 MG capsule TAKE 1 CAPSULE BY MOUTH EVERY DAY 90 capsule 1  . ondansetron (ZOFRAN ODT) 8 MG disintegrating tablet Take 1 tablet (8 mg total) by mouth every 8 (eight) hours as needed for nausea or vomiting. 20 tablet 0  . polyethylene glycol powder (GLYCOLAX/MIRALAX) powder Take 17 g by mouth daily. (Patient taking differently: Take 17 g by mouth 2 (two) times daily. ) 255 g 0  . Probiotic Product (ALIGN) 4 MG CAPS Take 4 mg by mouth daily.     . simvastatin (ZOCOR) 20 MG tablet TAKE 1 TABLET BY MOUTH EVERYDAY AT BEDTIME (Patient taking differently: Take 20 mg by mouth at bedtime. ) 90 tablet 3  . traMADol (ULTRAM) 50 MG tablet Take 1 tablet (50 mg total) by mouth every 6 (six) hours as needed. 20 tablet 1  . vitamin B-12 (CYANOCOBALAMIN) 1000 MCG tablet Take 1,000 mcg by mouth daily.    [name] VITAMIN D PO Take 4,000 Units by mouth daily.     . cephALEXin (KEFLEX) 500 MG capsule Take 1 capsule (500 mg total) by mouth 3 (three) times daily for 7 days. 21 capsule 0   No current facility-administered medications for this visit.      Objective:  BP 132/74   Pulse 88   Temp 97.7  F (36.5 C) (Temporal)   Ht 5\' 7"  (1.702 m)   Wt 215 lb (97.5 kg)   SpO2 97%   BMI 33.67 kg/m  Gen: NAD, resting comfortably CV: RRR no murmurs rubs or gallops Lungs: CTAB no crackles, wheeze, rhonchi Abdomen: soft/nontender/nondistended/normal bowel sounds.  Ext: no edema Skin: warm, dry  Obtaining UA- he was not able to urinate at beginning of visit- so given 2 bottles of water  No results found for this or any previous visit (from the past 24 hour(s)).  Assessment and Plan    #Concern for UTI S: Patients symptoms started started about three days ago . Moved a mattress on Tuesday and had some pain at his incision- had some incision site pain from recent hernia then dysuria started right afte rthat.  Complains of dysuria:after urination ; polyuria:- not significant urgency- no. Nocturia- no.  Symptoms are listed above- seem to be improving slightly today ROS- no fever, chills, vomiting,  No blood in urine.  Some fatigue. Some flank pain yesterday- better today. Slight rectal pain other days- not today- kind of felt like had to use bathroom- history of prostatectomy though A/P: UA/urine test will be obtained along with culture. Empiric treatment - we opted to give him a prescription for keflex and he is only going to take if symptoms worsen or UA with high risk for infection.  Patient to follow up if new or worsening symptoms or failure to improve.     Recommended follow up: Return for as needed for new or worsening symptoms.  Otherwise we have your march visit planned Future Appointments  Date Time Provider [place]  02/23/2019  9:45 AM Gary Fleet [facility]  03/06/2019 11:30 AM Cameron Sprang, MD [facility] None  06/06/2019  8:40 AM [name], MD [facility]  02/23/2020  8:20 AM [name], MD [facility]   Lab/Order associations:   ICD-10-CM   1. Dysuria  R30.0 POCT Urinalysis Dipstick (Automated)    Urine culture   Meds ordered this  encounter  Medications  . cephALEXin (KEFLEX) 500 MG capsule    Sig: Take 1 capsule (500 mg total) by mouth 3 (three) times daily for 7 days.    Dispense:  21 capsule    Refill:  0    Return precautions advised.  [name], MD

## 2019-02-10 NOTE — Addendum Note (Signed)
Addended by: Francis Dowse T on: 02/10/2019 10:09 AM   Modules accepted: Orders

## 2019-02-10 NOTE — Patient Instructions (Addendum)
Health Maintenance Due  Topic Date Due  . COLONOSCOPY has cologuard order in  12/13/2018   Team will get you another bottle of water  UA/urine test will be obtained along with culture. Empiric treatment - we opted to give him a prescription for keflex and he is only going to take if symptoms worsen or UA with high risk for infection.  Patient to follow up if new or worsening symptoms or failure to improve.     Recommended follow up: Return for as needed for new or worsening symptoms.  Otherwise we have your march visit planned

## 2019-02-11 LAB — URINE CULTURE
MICRO NUMBER:: 1124497
Result:: NO GROWTH
SPECIMEN QUALITY:: ADEQUATE

## 2019-02-13 ENCOUNTER — Telehealth: Payer: Self-pay

## 2019-02-13 NOTE — Telephone Encounter (Signed)
Copied from [place] [id number]. Topic: General - Other >> Feb 13, 2019 11:24 AM Sheran Luz wrote: Patient states that pharmacy never received RX for cologuard. He inquired if that is able to be resent.

## 2019-02-14 DIAGNOSIS — J3089 Other allergic rhinitis: Secondary | ICD-10-CM | POA: Diagnosis not present

## 2019-02-14 DIAGNOSIS — J301 Allergic rhinitis due to pollen: Secondary | ICD-10-CM | POA: Diagnosis not present

## 2019-02-14 DIAGNOSIS — J3081 Allergic rhinitis due to animal (cat) (dog) hair and dander: Secondary | ICD-10-CM | POA: Diagnosis not present

## 2019-02-14 NOTE — Telephone Encounter (Signed)
Called and f/u with pt and he states cologuard never received his order. I will refax the req back to cologaurd and advised pt to let us know if he still does not hear from them.

## 2019-02-21 DIAGNOSIS — Z1211 Encounter for screening for malignant neoplasm of colon: Secondary | ICD-10-CM | POA: Diagnosis not present

## 2019-02-21 LAB — COLOGUARD: Cologuard: NEGATIVE

## 2019-02-22 ENCOUNTER — Telehealth: Payer: Self-pay | Admitting: Neurology

## 2019-02-22 NOTE — Progress Notes (Deleted)
[name] [place], [place] [address] Phone: [phone] Subjective:    I'm seeing this patient by the request  of:    This visit occurred during the SARS-CoV-2 public health emergency.  Safety protocols were in place, including screening questions prior to the visit, additional usage of staff PPE, and extensive cleaning of exam room while observing appropriate contact time as indicated for disinfecting solutions.     CC:   [id number]  Joshua [name] is a 78 y.o. male coming in with complaint of ***  Onset-  Location Duration-  Character- Aggravating factors- Reliving factors-  Therapies tried-  Severity-     Past Medical History:  Diagnosis Date  . Acute medial meniscal tear   . Anemia   . Arthritis    "middle finger right hand; right knee; neck" (02/06/2014)  . Asthma   . Bladder cancer ([place]) 04/2010   "cauterized during prostate OR"  . CAD (coronary artery disease)    CABG 2001  . Depression   . Diverticulosis   . DJD (degenerative joint disease)    BACK  . GERD (gastroesophageal reflux disease)   . History of gout   . Hyperlipidemia   . Hypertension   . IBS (irritable bowel syndrome)   . Obesity   . OSA (obstructive sleep apnea)    USES CPAP   . Pancreatitis ~ 1980  . Prostate cancer ([place]) 04/2010   Past Surgical History:  Procedure Laterality Date  . ANTERIOR CERVICAL DECOMP/DISCECTOMY FUSION  05/26/2006   PLATE PLACED  . CARDIAC CATHETERIZATION  11/20/1999  . CORONARY ARTERY BYPASS GRAFT  11/20/1999   SVG-RI1-RI2, SVG-OM, SVG-dRCA  . INGUINAL HERNIA REPAIR Left 01/02/2019   Procedure: OPEN LEFT INGUINAL HERNIA REPAIR WITH MESH;  Surgeon: Johnathan Hausen, MD;  Location: [facility];  Service: General;  Laterality: Left;  . KNEE ARTHROSCOPY Right 1992; 04/2008  . LAPAROSCOPIC CHOLECYSTECTOMY  2007  . NASAL SEPTUM SURGERY Right 2007  . ROBOT ASSISTED LAPAROSCOPIC RADICAL PROSTATECTOMY  04/2010  .  THYROIDECTOMY, PARTIAL  1980   Social History   Socioeconomic History  . Marital status: Married    Spouse name: Not on file  . Number of children: Not on file  . Years of education: Not on file  . Highest education level: Not on file  Occupational History  . Occupation: retired    Fish farm manager: RETIRED  Social Needs  . Financial resource strain: Not on file  . Food insecurity    Worry: Not on file    Inability: Not on file  . Transportation needs    Medical: Not on file    Non-medical: Not on file  Tobacco Use  . Smoking status: Former Smoker    Packs/day: 1.00    Years: 35.00    Pack years: 35.00    Types: Cigarettes    Quit date: 06/16/1992    Years since quitting: 26.7  . Smokeless tobacco: Never Used  Substance and Sexual Activity  . Alcohol use: Yes    Alcohol/week: 7.0 standard drinks    Types: 7 Shots of liquor per week    Comment: mixed drink Q DAY  . Drug use: No  . Sexual activity: Not on file  Lifestyle  . Physical activity    Days per week: Not on file    Minutes per session: Not on file  . Stress: Not on file  Relationships  . Social Herbalist on phone: Not  on file    Gets together: Not on file    Attends religious service: Not on file    Active member of club or organization: Not on file    Attends meetings of clubs or organizations: Not on file    Relationship status: Not on file  Other Topics Concern  . Not on file  Social History Narrative   Married 42 years in 2015. No kids (mumps at age 83)      Retired from Mud[employer] in [place] at [employer]: poker, movies and tv, staying active      Right handed   2 level home with steps he uses   Allergies  Allergen Reactions  . 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 Bromfed Nausea And Vomiting  . Clarithromycin Other (See Comments)     gastritis  . Codeine Other (See Comments)    Trouble breathing  .  Doxycycline Hyclate Nausea And Vomiting  . Modafinil Other (See Comments)     anxiety-nervousness  . Oxycodone-Acetaminophen Itching  . Promethazine Hcl Other (See Comments)     fainting  . Quinolones     Cipro  . Telithromycin Nausea And Vomiting  . Hydrocodone-Acetaminophen Rash  . Sulfonamide Derivatives Rash   Family History  Problem Relation Age of Onset  . Heart disease Mother   . Hyperlipidemia Mother   . Heart disease Father   . Hyperlipidemia Father   . Diabetes Brother      Current Outpatient Medications (Cardiovascular):  [name]  Evolocumab (REPATHA SURECLICK) XX123456 MG/ML SOAJ, Inject 140 mg into the skin every 14 (fourteen) days. .  hydrochlorothiazide (HYDRODIURIL) 25 MG tablet, TAKE 1 TABLET BY MOUTH EVERY DAY (Patient taking differently: Take 25 mg by mouth daily. ) .  losartan (COZAAR) 100 MG tablet, TAKE 1 TABLET BY MOUTH EVERY DAY (Patient taking differently: Take 100 mg by mouth at bedtime. ) .  simvastatin (ZOCOR) 20 MG tablet, TAKE 1 TABLET BY MOUTH EVERYDAY AT BEDTIME (Patient taking differently: Take 20 mg by mouth at bedtime. )  Current Outpatient Medications (Respiratory):  .  albuterol (PROAIR HFA) 108 (90 BASE) MCG/ACT inhaler, Inhale 1-2 puffs into the lungs every 6 (six) hours as needed for wheezing or shortness of breath. Cristy Friedlander HFA 110 MCG/ACT inhaler, Inhale 2 puffs into the lungs 2 (two) times daily. Reported on 03/22/2015 .  fluticasone (FLONASE) 50 MCG/ACT nasal spray, Place 1 spray into both nostrils 2 (two) times daily.  [name]  loratadine (CLARITIN) 10 MG tablet, Take 10 mg by mouth at bedtime.   Current Outpatient Medications (Analgesics):  .  acetaminophen (TYLENOL) 500 MG tablet, Take 1,000 mg by mouth every 6 (six) hours as needed for moderate pain or headache.  [name]  aspirin EC 81 MG tablet, Take 81 mg by mouth daily. .  traMADol (ULTRAM) 50 MG tablet, Take 1 tablet (50 mg total) by mouth every 6 (six) hours as needed.  Current Outpatient  Medications (Hematological):  .  folic acid (FOLVITE) Q000111Q MCG tablet, Take 800 mcg by mouth daily.  .  vitamin B-12 (CYANOCOBALAMIN) 1000 MCG tablet, Take 1,000 mcg by mouth daily.  Current Outpatient Medications (Other):  .  diazepam (VALIUM) 5 MG tablet, Take 1 tablet (5 mg total) by mouth at bedtime as needed. .  gabapentin (NEURONTIN) 100 MG capsule, Take 2 capsules in the morning, 2 capsules in the  afternoon and 6 capsules at bedtime (Patient taking differently: Take 200-600 mg by mouth See admin instructions. Take 200 mg by mouth in the morning,200 mg in the afternoon and 600 mg at bedtime) .  Glucosamine-Chondroitin (MOVE FREE PO), Take 1 tablet by mouth daily. [name]  ketoconazole (NIZORAL) 2 % cream, APPLY TO AFFECTED AREA(S) TWICE A DAY WHEN FLARED .  miconazole (MICOTIN) 2 % powder, Apply topically as needed for itching. TO GROIN AREA CROTCH ITCH AREA RED NO DRAINAGE .  omeprazole (PRILOSEC) 20 MG capsule, TAKE 1 CAPSULE BY MOUTH EVERY DAY .  ondansetron (ZOFRAN ODT) 8 MG disintegrating tablet, Take 1 tablet (8 mg total) by mouth every 8 (eight) hours as needed for nausea or vomiting. .  polyethylene glycol powder (GLYCOLAX/MIRALAX) powder, Take 17 g by mouth daily. (Patient taking differently: Take 17 g by mouth 2 (two) times daily. ) .  Probiotic Product (ALIGN) 4 MG CAPS, Take 4 mg by mouth daily.  [name]  VITAMIN D PO, Take 4,000 Units by mouth daily.     Past medical history, social, surgical and family history all reviewed in electronic medical record.  No pertanent information unless stated regarding to the chief complaint.   Review of Systems:  No headache, visual changes, nausea, vomiting, diarrhea, constipation, dizziness, abdominal pain, skin rash, fevers, chills, night sweats, weight loss, swollen lymph nodes, body aches, joint swelling, muscle aches, chest pain, shortness of breath, mood changes.   Objective  There were no vitals taken for this visit. Systems examined below as  of    General: No apparent distress alert and oriented x3 mood and affect normal, dressed appropriately.  HEENT: Pupils equal, extraocular movements intact  Respiratory: Patient's speak in full sentences and does not appear short of breath  Cardiovascular: No lower extremity edema, non tender, no erythema  Skin: Warm dry intact with no signs of infection or rash on extremities or on axial skeleton.  Abdomen: Soft nontender  Neuro: Cranial nerves II through XII are intact, neurovascularly intact in all extremities with 2+ DTRs and 2+ pulses.  Lymph: No lymphadenopathy of posterior or anterior cervical chain or axillae bilaterally.  Gait normal with good balance and coordination.  MSK:  Non tender with full range of motion and good stability and symmetric strength and tone of shoulders, elbows, wrist, hip, knee and ankles bilaterally.     Impression and Recommendations:     This case required medical decision making of moderate complexity. The above documentation has been reviewed and is accurate and complete Lyndal Pulley, DO       Note: This dictation was prepared with Dragon dictation along with smaller phrase technology. Any transcriptional errors that result from this process are unintentional.

## 2019-02-22 NOTE — Telephone Encounter (Signed)
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 He would like to speak to someone about this. He has appt with her on 03-06-19 also

## 2019-02-22 NOTE — Telephone Encounter (Signed)
Spoke with patient and he will address neuropathy and loss of words at his visit.

## 2019-02-23 ENCOUNTER — Ambulatory Visit: Payer: PPO | Admitting: Family Medicine

## 2019-02-23 ENCOUNTER — Telehealth: Payer: Self-pay | Admitting: Family Medicine

## 2019-02-23 MED ORDER — MECLIZINE HCL 25 MG PO TABS: 25.0000 mg | ORAL_TABLET | Freq: Three times a day (TID) | ORAL | 0 refills | Status: DC | PRN

## 2019-02-23 NOTE — Telephone Encounter (Signed)
Called and spoke with pt and gave him below information.

## 2019-02-23 NOTE — Telephone Encounter (Signed)
See below

## 2019-02-23 NOTE — Telephone Encounter (Signed)
PT has vertigo and it was real bad the other day and he asked if Dr. Yong Channel has anything he can send to the pharmacy to help with that/ please call pt and advise

## 2019-02-23 NOTE — Telephone Encounter (Signed)
Patient has a history of benign positional vertigo.  With his dizziness/vertigo is with head movement-he can try the meclizine.  Dizziness/vertigo can get a sign of stroke and if it is persistent and not related to head movement needs to be evaluated either here or in the emergency room

## 2019-02-23 NOTE — Telephone Encounter (Signed)
Please inform patient that these calls were automatically generated and are supposed to be discontinued.  Copied from [place] [id number]. Topic: General - Other >> Feb 23, 2019  9:14 AM Joshua [name] wrote: Reason for CRM: Pt states hes been getting several calls about needing test completed such as cholesterol , etc and he states he has already had this checked and was wondering why he keeps getting these calls/ please advise

## 2019-02-23 NOTE — Telephone Encounter (Signed)
See note

## 2019-02-24 DIAGNOSIS — G4733 Obstructive sleep apnea (adult) (pediatric): Secondary | ICD-10-CM | POA: Diagnosis not present

## 2019-02-27 DIAGNOSIS — Z1211 Encounter for screening for malignant neoplasm of colon: Secondary | ICD-10-CM

## 2019-02-28 DIAGNOSIS — J3081 Allergic rhinitis due to animal (cat) (dog) hair and dander: Secondary | ICD-10-CM | POA: Diagnosis not present

## 2019-02-28 DIAGNOSIS — J301 Allergic rhinitis due to pollen: Secondary | ICD-10-CM | POA: Diagnosis not present

## 2019-02-28 DIAGNOSIS — J3089 Other allergic rhinitis: Secondary | ICD-10-CM | POA: Diagnosis not present

## 2019-03-02 ENCOUNTER — Encounter: Payer: Self-pay | Admitting: Family Medicine

## 2019-03-02 ENCOUNTER — Ambulatory Visit (INDEPENDENT_AMBULATORY_CARE_PROVIDER_SITE_OTHER): Payer: PPO | Admitting: Family Medicine

## 2019-03-02 ENCOUNTER — Other Ambulatory Visit: Payer: Self-pay

## 2019-03-02 VITALS — BP 118/80 | HR 82 | Ht 67.0 in | Wt 218.0 lb

## 2019-03-02 DIAGNOSIS — M51369 Other intervertebral disc degeneration, lumbar region without mention of lumbar back pain or lower extremity pain: Secondary | ICD-10-CM

## 2019-03-02 DIAGNOSIS — M5416 Radiculopathy, lumbar region: Secondary | ICD-10-CM | POA: Diagnosis not present

## 2019-03-02 DIAGNOSIS — M5136 Other intervertebral disc degeneration, lumbar region: Secondary | ICD-10-CM

## 2019-03-02 NOTE — Assessment & Plan Note (Signed)
Patient has responded well previously to more of the radicular symptoms with a epidural.  We held until patient is done with his hernia surgery.  Patient states that the abdominal pain and testicular pain is significantly better but continues to have the back pain.  I would like to repeat epidural at this time to see if we can get patient more improvement.  Patient is in agreement with the plan.  Will follow up with me again 2 to 3 weeks after the epidural.  Continue the home exercises otherwise.

## 2019-03-02 NOTE — Progress Notes (Signed)
[name] [place], [place] [address] Phone: [phone] Subjective:   [name], am serving as a scribe for Dr. Hulan Saas. This visit occurred during the SARS-CoV-2 public health emergency.  Safety protocols were in place, including screening questions prior to the visit, additional usage of staff PPE, and extensive cleaning of exam room while observing appropriate contact time as indicated for disinfecting solutions.     CC: Low back pain follow-up  [id number]   11/29/2018 Patient has been doing significantly better after the epidural.  We have made significant progress.  No change in medication, home exercises given medical be more beneficial.  Patient wants to avoid any type of formal physical therapy secondary to coronavirus which I agree with.  Patient should do well and we can repeat epidurals if necessary.  Follow-up again 2 months  Update 03/02/2019 NOLLIE DIPIRRO is a 78 y.o. male coming in with complaint of back pain.. Feels that his back pain is returning now that he is more active following hernia surgery.  Patient states that he started having the increasing discomfort and pain again.  Patient states it is severe enough that is stopping him from some activities and even waking him up at night.  Medications do not seem to be causing as much improvement as he has had in the past.     Past Medical History:  Diagnosis Date  . Acute medial meniscal tear   . Anemia   . Arthritis    "middle finger right hand; right knee; neck" (02/06/2014)  . Asthma   . Bladder cancer ([place]) 04/2010   "cauterized during prostate OR"  . CAD (coronary artery disease)    CABG 2001  . Depression   . Diverticulosis   . DJD (degenerative joint disease)    BACK  . GERD (gastroesophageal reflux disease)   . History of gout   . Hyperlipidemia   . Hypertension   . IBS (irritable bowel syndrome)   . Obesity   . OSA (obstructive sleep  apnea)    USES CPAP   . Pancreatitis ~ 1980  . Prostate cancer ([place]) 04/2010   Past Surgical History:  Procedure Laterality Date  . ANTERIOR CERVICAL DECOMP/DISCECTOMY FUSION  05/26/2006   PLATE PLACED  . CARDIAC CATHETERIZATION  11/20/1999  . CORONARY ARTERY BYPASS GRAFT  11/20/1999   SVG-RI1-RI2, SVG-OM, SVG-dRCA  . INGUINAL HERNIA REPAIR Left 01/02/2019   Procedure: OPEN LEFT INGUINAL HERNIA REPAIR WITH MESH;  Surgeon: Johnathan Hausen, MD;  Location: [facility];  Service: General;  Laterality: Left;  . KNEE ARTHROSCOPY Right 1992; 04/2008  . LAPAROSCOPIC CHOLECYSTECTOMY  2007  . NASAL SEPTUM SURGERY Right 2007  . ROBOT ASSISTED LAPAROSCOPIC RADICAL PROSTATECTOMY  04/2010  . THYROIDECTOMY, PARTIAL  1980   Social History   Socioeconomic History  . Marital status: Married    Spouse name: Not on file  . Number of children: Not on file  . Years of education: Not on file  . Highest education level: Not on file  Occupational History  . Occupation: retired    Fish farm manager: RETIRED  Tobacco Use  . Smoking status: Former Smoker    Packs/day: 1.00    Years: 35.00    Pack years: 35.00    Types: Cigarettes    Quit date: 06/16/1992    Years since quitting: 26.7  . Smokeless tobacco: Never Used  Substance and Sexual Activity  . Alcohol use: Yes    Alcohol/week: 7.0  standard drinks    Types: 7 Shots of liquor per week    Comment: mixed drink Q DAY  . Drug use: No  . Sexual activity: Not on file  Other Topics Concern  . Not on file  Social History Narrative   Married 42 years in 2015. No kids (mumps at age 57)      Retired from [employer] in Engineer, technical sales   Volunteers at [employer]: poker, movies and tv, staying active      Right handed   2 level home with steps he uses   Social Determinants of Radio broadcast assistant Strain:   . Difficulty of Paying Living Expenses: Not on file  Food Insecurity:   . Worried About Charity fundraiser in the Last Year: Not on file  . Ran Out  of Food in the Last Year: Not on file  Transportation Needs:   . Lack of Transportation (Medical): Not on file  . Lack of Transportation (Non-Medical): Not on file  Physical Activity:   . Days of Exercise per Week: Not on file  . Minutes of Exercise per Session: Not on file  Stress:   . Feeling of Stress : Not on file  Social Connections:   . Frequency of Communication with Friends and Family: Not on file  . Frequency of Social Gatherings with Friends and Family: Not on file  . Attends Religious Services: Not on file  . Active Member of Clubs or Organizations: Not on file  . Attends Archivist Meetings: Not on file  . Marital Status: Not on file   Allergies  Allergen Reactions  . 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 Bromfed Nausea And Vomiting  . Clarithromycin Other (See Comments)     gastritis  . Codeine Other (See Comments)    Trouble breathing  . Doxycycline Hyclate Nausea And Vomiting  . Modafinil Other (See Comments)     anxiety-nervousness  . Oxycodone-Acetaminophen Itching  . Promethazine Hcl Other (See Comments)     fainting  . Quinolones     Cipro  . Telithromycin Nausea And Vomiting  . Hydrocodone-Acetaminophen Rash  . Sulfonamide Derivatives Rash   Family History  Problem Relation Age of Onset  . Heart disease Mother   . Hyperlipidemia Mother   . Heart disease Father   . Hyperlipidemia Father   . Diabetes Brother      Current Outpatient Medications (Cardiovascular):  [name]  Evolocumab (REPATHA SURECLICK) [id number] MG/ML SOAJ, Inject 140 mg into the skin every 14 (fourteen) days. .  hydrochlorothiazide (HYDRODIURIL) 25 MG tablet, TAKE 1 TABLET BY MOUTH EVERY DAY (Patient taking differently: Take 25 mg by mouth daily. ) .  losartan (COZAAR) 100 MG tablet, TAKE 1 TABLET BY MOUTH EVERY DAY (Patient taking differently: Take 100 mg by mouth at bedtime. ) .  simvastatin (ZOCOR) 20 MG tablet, TAKE 1  TABLET BY MOUTH EVERYDAY AT BEDTIME (Patient taking differently: Take 20 mg by mouth at bedtime. )  Current Outpatient Medications (Respiratory):  .  albuterol (PROAIR HFA) 108 (90 BASE) MCG/ACT inhaler, Inhale 1-2 puffs into the lungs every 6 (six) hours as needed for wheezing or shortness of breath. Cristy Friedlander HFA 110 MCG/ACT inhaler, Inhale 2 puffs into the lungs 2 (two) times daily. Reported on 03/22/2015 .  fluticasone (FLONASE) 50 MCG/ACT nasal spray, Place 1 spray into both nostrils 2 (two) times  daily.  .  loratadine (CLARITIN) 10 MG tablet, Take 10 mg by mouth at bedtime.   Current Outpatient Medications (Analgesics):  .  acetaminophen (TYLENOL) 500 MG tablet, Take 1,000 mg by mouth every 6 (six) hours as needed for moderate pain or headache.  [name]  aspirin EC 81 MG tablet, Take 81 mg by mouth daily. .  traMADol (ULTRAM) 50 MG tablet, Take 1 tablet (50 mg total) by mouth every 6 (six) hours as needed.  Current Outpatient Medications (Hematological):  .  folic acid (FOLVITE) Q000111Q MCG tablet, Take 800 mcg by mouth daily.  .  vitamin B-12 (CYANOCOBALAMIN) 1000 MCG tablet, Take 1,000 mcg by mouth daily.  Current Outpatient Medications (Other):  .  diazepam (VALIUM) 5 MG tablet, Take 1 tablet (5 mg total) by mouth at bedtime as needed. .  gabapentin (NEURONTIN) 100 MG capsule, Take 2 capsules in the morning, 2 capsules in the afternoon and 6 capsules at bedtime (Patient taking differently: Take 200-600 mg by mouth See admin instructions. Take 200 mg by mouth in the morning,200 mg in the afternoon and 600 mg at bedtime) .  Glucosamine-Chondroitin (MOVE FREE PO), Take 1 tablet by mouth daily. [name]  ketoconazole (NIZORAL) 2 % cream, APPLY TO AFFECTED AREA(S) TWICE A DAY WHEN FLARED .  meclizine (ANTIVERT) 25 MG tablet, Take 1 tablet (25 mg total) by mouth 3 (three) times daily as needed for dizziness. .  miconazole (MICOTIN) 2 % powder, Apply topically as needed for itching. TO GROIN AREA CROTCH  ITCH AREA RED NO DRAINAGE .  omeprazole (PRILOSEC) 20 MG capsule, TAKE 1 CAPSULE BY MOUTH EVERY DAY .  ondansetron (ZOFRAN ODT) 8 MG disintegrating tablet, Take 1 tablet (8 mg total) by mouth every 8 (eight) hours as needed for nausea or vomiting. .  polyethylene glycol powder (GLYCOLAX/MIRALAX) powder, Take 17 g by mouth daily. (Patient taking differently: Take 17 g by mouth 2 (two) times daily. ) .  Probiotic Product (ALIGN) 4 MG CAPS, Take 4 mg by mouth daily.  [name]  VITAMIN D PO, Take 4,000 Units by mouth daily.     Past medical history, social, surgical and family history all reviewed in electronic medical record.  No pertanent information unless stated regarding to the chief complaint.   Review of Systems:  No headache, visual changes, nausea, vomiting, diarrhea, constipation, dizziness, abdominal pain, skin rash, fevers, chills, night sweats, weight loss, swollen lymph nodes, body aches, joint swelling,  chest pain, shortness of breath, mood changes.  Positive muscle aches  Objective  Blood pressure 118/80, pulse 82, height 5\' 7"  (1.702 m), weight 218 lb (98.9 kg), SpO2 98 %.    General: No apparent distress alert and oriented x3 mood and affect normal, dressed appropriately.  HEENT: Pupils equal, extraocular movements intact  Respiratory: Patient's speak in full sentences and does not appear short of breath  Cardiovascular: No lower extremity edema, non tender, no erythema  Skin: Warm dry intact with no signs of infection or rash on extremities or on axial skeleton.  Abdomen: Soft nontender  Neuro: Cranial nerves II through XII are intact, neurovascularly intact in all extremities with 2+ DTRs and 2+ pulses.  Lymph: No lymphadenopathy of posterior or anterior cervical chain or axillae bilaterally.  Gait normal with good balance and coordination.  MSK:  tender with full range of motion and good stability and symmetric strength and tone of shoulders, elbows, wrist, hip, knee and ankles  bilaterally.  Mild arthritic changes of multiple joints Patient's back  exam does have some loss of lordosis, severe tightness noted of the paraspinal musculature lumbar spine right greater than left.  Patient has some very mild radicular symptoms of the right leg with straight leg test.  Difficult to do Corky Sox secondary to tightness    Impression and Recommendations:     This case required medical decision making of moderate complexity. The above documentation has been reviewed and is accurate and complete Lyndal Pulley, DO       Note: This dictation was prepared with Dragon dictation along with smaller phrase technology. Any transcriptional errors that result from this process are unintentional.

## 2019-03-02 NOTE — Patient Instructions (Addendum)
  [address], [address] [place], [place] [address] Phone [phone]  Virtual 2-3 weeks after epidural injection

## 2019-03-06 ENCOUNTER — Encounter: Payer: Self-pay | Admitting: Neurology

## 2019-03-06 ENCOUNTER — Other Ambulatory Visit: Payer: Self-pay

## 2019-03-06 ENCOUNTER — Telehealth (INDEPENDENT_AMBULATORY_CARE_PROVIDER_SITE_OTHER): Payer: PPO | Admitting: Neurology

## 2019-03-06 VITALS — Ht 66.5 in | Wt 218.0 lb

## 2019-03-06 DIAGNOSIS — R413 Other amnesia: Secondary | ICD-10-CM | POA: Diagnosis not present

## 2019-03-06 DIAGNOSIS — R519 Headache, unspecified: Secondary | ICD-10-CM | POA: Diagnosis not present

## 2019-03-06 DIAGNOSIS — G609 Hereditary and idiopathic neuropathy, unspecified: Secondary | ICD-10-CM | POA: Diagnosis not present
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
## 2019-03-07 ENCOUNTER — Other Ambulatory Visit: Payer: PPO

## 2019-03-08 ENCOUNTER — Ambulatory Visit
Admission: RE | Admit: 2019-03-08 | Discharge: 2019-03-08 | Disposition: A | Discharge status: Home / Self Care | Payer: PPO | Source: Ambulatory Visit | Attending: Family Medicine | Admitting: Family Medicine

## 2019-03-08 ENCOUNTER — Other Ambulatory Visit: Payer: Self-pay

## 2019-03-08 DIAGNOSIS — M545 Low back pain: Secondary | ICD-10-CM | POA: Diagnosis not present

## 2019-03-08 DIAGNOSIS — M5416 Radiculopathy, lumbar region: Secondary | ICD-10-CM

## 2019-03-08 DIAGNOSIS — M79604 Pain in right leg: Secondary | ICD-10-CM | POA: Diagnosis not present

## 2019-03-08 MED ORDER — METHYLPREDNISOLONE ACETATE 40 MG/ML INJ SUSP (RADIOLOG
120.0000 mg | Freq: Once | INTRAMUSCULAR | Status: AC
  Administered 2019-03-08: 120 mg via EPIDURAL

## 2019-03-08 MED ORDER — IOPAMIDOL (ISOVUE-M 200) INJECTION 41%
1.0000 mL | Freq: Once | INTRAMUSCULAR | Status: AC
  Administered 2019-03-08: 1 mL via EPIDURAL

## 2019-03-08 NOTE — Discharge Instructions (Signed)

## 2019-03-09 ENCOUNTER — Other Ambulatory Visit: Payer: Self-pay | Admitting: Family Medicine

## 2019-03-09 DIAGNOSIS — J301 Allergic rhinitis due to pollen: Secondary | ICD-10-CM | POA: Diagnosis not present

## 2019-03-09 DIAGNOSIS — J3081 Allergic rhinitis due to animal (cat) (dog) hair and dander: Secondary | ICD-10-CM | POA: Diagnosis not present

## 2019-03-09 DIAGNOSIS — J3089 Other allergic rhinitis: Secondary | ICD-10-CM | POA: Diagnosis not present

## 2019-03-14 ENCOUNTER — Telehealth: Payer: Self-pay | Admitting: Pulmonary Disease

## 2019-03-14 DIAGNOSIS — G4733 Obstructive sleep apnea (adult) (pediatric): Secondary | ICD-10-CM

## 2019-03-14 NOTE — Telephone Encounter (Signed)
Spoke with pt, states that since getting his new cpap machine his masks are not sealing properly to his face.  Experiencing lots of leaks.  Pt most recently replaced cpap mask on Saturday, is already experiencing leaks.   DME: [facility]   VS please advise on recs.  Thanks!

## 2019-03-15 DIAGNOSIS — J3081 Allergic rhinitis due to animal (cat) (dog) hair and dander: Secondary | ICD-10-CM | POA: Diagnosis not present

## 2019-03-15 DIAGNOSIS — J3089 Other allergic rhinitis: Secondary | ICD-10-CM | POA: Diagnosis not present

## 2019-03-15 DIAGNOSIS — J301 Allergic rhinitis due to pollen: Secondary | ICD-10-CM | POA: Diagnosis not present

## 2019-03-15 NOTE — Telephone Encounter (Signed)
Please have CPAP download sent.  Please also send order to have CPAP mask refitted.

## 2019-03-15 NOTE — Telephone Encounter (Signed)
Order placed to [facility] for them to provide [place] with a download of pt's cpap data. Also placed order for pt to have mask fit. Attempted to call pt letting him know this was being done but unable to reach. Left pt a detailed message. Nothing further needed.

## 2019-03-20 DIAGNOSIS — J3089 Other allergic rhinitis: Secondary | ICD-10-CM | POA: Diagnosis not present

## 2019-03-20 DIAGNOSIS — J301 Allergic rhinitis due to pollen: Secondary | ICD-10-CM | POA: Diagnosis not present

## 2019-03-20 DIAGNOSIS — J3081 Allergic rhinitis due to animal (cat) (dog) hair and dander: Secondary | ICD-10-CM | POA: Diagnosis not present

## 2019-03-23 ENCOUNTER — Telehealth: Payer: Self-pay

## 2019-03-23 MED ORDER — REPATHA SURECLICK 140 MG/ML ~~LOC~~ SOAJ: 140.0000 mg | SUBCUTANEOUS | 3 refills | Status: DC

## 2019-03-23 NOTE — Telephone Encounter (Signed)
Called the pt and notified them approved for [facility] and rx sent pt voiced understanding

## 2019-03-27 DIAGNOSIS — G4733 Obstructive sleep apnea (adult) (pediatric): Secondary | ICD-10-CM | POA: Diagnosis not present

## 2019-03-28 ENCOUNTER — Ambulatory Visit
Admission: RE | Admit: 2019-03-28 | Discharge: 2019-03-28 | Disposition: A | Discharge status: Home / Self Care | Payer: PPO | Source: Ambulatory Visit | Attending: Neurology | Admitting: Neurology

## 2019-03-28 ENCOUNTER — Other Ambulatory Visit (HOSPITAL_COMMUNITY)
Admission: RE | Admit: 2019-03-28 | Discharge: 2019-03-28 | Disposition: A | Discharge status: Home / Self Care | Payer: PPO | Source: Ambulatory Visit | Attending: Pulmonary Disease | Admitting: Pulmonary Disease

## 2019-03-28 ENCOUNTER — Other Ambulatory Visit: Payer: Self-pay

## 2019-03-28 DIAGNOSIS — Z20822 Contact with and (suspected) exposure to covid-19: Secondary | ICD-10-CM | POA: Insufficient documentation

## 2019-03-28 DIAGNOSIS — R519 Headache, unspecified: Secondary | ICD-10-CM

## 2019-03-28 DIAGNOSIS — Z01812 Encounter for preprocedural laboratory examination: Secondary | ICD-10-CM | POA: Diagnosis not present

## 2019-03-28 DIAGNOSIS — R413 Other amnesia: Secondary | ICD-10-CM

## 2019-03-28 LAB — SARS CORONAVIRUS 2 (TAT 6-24 HRS): SARS Coronavirus 2: NEGATIVE

## 2019-03-29 ENCOUNTER — Ambulatory Visit (HOSPITAL_BASED_OUTPATIENT_CLINIC_OR_DEPARTMENT_OTHER): Payer: PPO | Attending: Pulmonary Disease | Admitting: Radiology

## 2019-03-29 DIAGNOSIS — G4733 Obstructive sleep apnea (adult) (pediatric): Secondary | ICD-10-CM

## 2019-03-30 ENCOUNTER — Other Ambulatory Visit: Payer: Self-pay

## 2019-03-31 ENCOUNTER — Ambulatory Visit (INDEPENDENT_AMBULATORY_CARE_PROVIDER_SITE_OTHER): Payer: PPO | Admitting: Family Medicine

## 2019-03-31 DIAGNOSIS — M5136 Other intervertebral disc degeneration, lumbar region: Secondary | ICD-10-CM

## 2019-03-31 DIAGNOSIS — M5416 Radiculopathy, lumbar region: Secondary | ICD-10-CM

## 2019-03-31 NOTE — Progress Notes (Signed)
Virtual Visit via Video Note  I connected with Joshua [name] on 04/02/19 at  1:30 PM EST by a video enabled telemedicine application and verified that I am speaking with the correct person using two identifiers.  Location: Patient: in home location alone Provider: In office location  I discussed the limitations of evaluation and management by telemedicine and the availability of in person appointments. The patient expressed understanding and agreed to proceed.  History of Present Illness:  Epidural on 03/08/2019. Patient states that he had relief for 2 weeks. Patient is having a hard time standing or walking for long periods of time. Pain is better than what it was before the epidural. States the his computer chair might be contributing to his symptoms. Is not using any medication for pain.  Observations/Objective: Alert and oriented x3 in, seems very comfortable.   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  Has had 2 in  6 months. Discussed with patient about icing regimen, home exercises, which activities to do which wants to avoid.  Patient is to increase activity slowly.  Patient will have a repeat epidural again.  Patient wants to avoid any type of surgical intervention.  Patient also wants to avoid any type of medications at this time.  Agree with the plan and will follow up then again in 3 weeks after another epidural  Follow Up Instructions: 3 weeks after epidural    I discussed the assessment and treatment plan with the patient. The patient was provided an opportunity to ask questions and all were answered. The patient agreed with the plan and demonstrated an understanding of the instructions.   The patient was advised to call back or seek an in-person evaluation if the symptoms worsen or if the condition fails to improve as anticipated.  I provided 13  minutes of -face-to-face time during this encounter.   Lyndal Pulley, DO

## 2019-04-02 ENCOUNTER — Encounter: Payer: Self-pay | Admitting: Family Medicine

## 2019-04-03 ENCOUNTER — Other Ambulatory Visit: Payer: Self-pay | Admitting: Neurology

## 2019-04-03 DIAGNOSIS — J3089 Other allergic rhinitis: Secondary | ICD-10-CM | POA: Diagnosis not present

## 2019-04-03 DIAGNOSIS — J301 Allergic rhinitis due to pollen: Secondary | ICD-10-CM | POA: Diagnosis not present

## 2019-04-03 DIAGNOSIS — J3081 Allergic rhinitis due to animal (cat) (dog) hair and dander: Secondary | ICD-10-CM | POA: Diagnosis not present

## 2019-04-03 MED ORDER — GABAPENTIN 300 MG PO CAPS: ORAL_CAPSULE | ORAL | 3 refills | Status: DC

## 2019-04-05 NOTE — Progress Notes (Signed)
Cardiology Office Note   Date:  04/07/2019   ID:  Joshua [name], DOB 1940-06-06, MRN [id number]  PCP:  [name], MD  Cardiologist:   [name], MD   Chief Complaint  Patient presents with  . Chest Pain      History of Present Illness: Joshua [name] is a 79 y.o. male who presents for follow up of CAD.  He had a negative perfusion study in 2015.  When I last saw him in Jan of 2020 he was having chest pain.   He had a POET (Plain Old Exercise Treadmill) and he had no ischemia.    He has been getting chest discomfort with activity.  This started initially when he would be at the end of the walk.  However, this week a couple of days ago he walked little more vigorously and the pain started at the beginning of the walk.  This is new onset exertional chest discomfort.  It is across his mid chest.  It is sharp to dull.  It can be 7 out of 10 in intensity.  There is no radiation to his jaw or to his arms.  He does not get new shortness of breath, diaphoresis or nausea.  It can take a couple of hours to go away.  He actually has had a little resting discomfort.  He is not had symptoms like this before that he can recall.  His predominant complaint was decreased exercise tolerance and fatigue when he had his coronary disease previously.  He called to be added to the schedule because of this discomfort.   Past Medical History:  Diagnosis Date  . Acute medial meniscal tear   . Anemia   . Arthritis    "middle finger right hand; right knee; neck" (02/06/2014)  . Asthma   . Bladder cancer ([place]) 04/2010   "cauterized during prostate OR"  . CAD (coronary artery disease)    CABG 2001  . Depression   . Diverticulosis   . DJD (degenerative joint disease)    BACK  . GERD (gastroesophageal reflux disease)   . H/O inguinal hernia repair 12/2018  . History of gout   . Hyperlipidemia   . Hypertension   . IBS (irritable bowel syndrome)   . Obesity   . OSA (obstructive sleep  apnea)    USES CPAP   . Pancreatitis ~ 1980  . Prostate cancer ([place]) 04/2010    Past Surgical History:  Procedure Laterality Date  . ANTERIOR CERVICAL DECOMP/DISCECTOMY FUSION  05/26/2006   PLATE PLACED  . CARDIAC CATHETERIZATION  11/20/1999  . CORONARY ARTERY BYPASS GRAFT  11/20/1999   SVG-RI1-RI2, SVG-OM, SVG-dRCA  . INGUINAL HERNIA REPAIR Left 01/02/2019   Procedure: OPEN LEFT INGUINAL HERNIA REPAIR WITH MESH;  Surgeon: Johnathan Hausen, MD;  Location: [facility];  Service: General;  Laterality: Left;  . KNEE ARTHROSCOPY Right 1992; 04/2008  . LAPAROSCOPIC CHOLECYSTECTOMY  2007  . NASAL SEPTUM SURGERY Right 2007  . ROBOT ASSISTED LAPAROSCOPIC RADICAL PROSTATECTOMY  04/2010  . THYROIDECTOMY, PARTIAL  1980     Current Outpatient Medications  Medication Sig Dispense Refill  . acetaminophen (TYLENOL) 500 MG tablet Take 1,000 mg by mouth every 6 (six) hours as needed for moderate pain or headache.     . albuterol (PROAIR HFA) 108 (90 BASE) MCG/ACT inhaler Inhale 1-2 puffs into the lungs every 6 (six) hours as needed for wheezing or shortness of breath. 18 g 3  . aspirin EC 81  MG tablet Take 81 mg by mouth daily.    . diazepam (VALIUM) 5 MG tablet Take 1 tablet (5 mg total) by mouth at bedtime as needed. 30 tablet 1  . Evolocumab (REPATHA SURECLICK) [id number] MG/ML SOAJ Inject 140 mg into the skin every 14 (fourteen) days. 6 pen 3  . FLOVENT HFA 110 MCG/ACT inhaler Inhale 2 puffs into the lungs 2 (two) times daily. Reported on 03/22/2015  5  . fluticasone (FLONASE) 50 MCG/ACT nasal spray Place 1 spray into both nostrils 2 (two) times daily.     . folic acid (FOLVITE) [id number] MCG tablet Take 800 mcg by mouth daily.     [name] gabapentin (NEURONTIN) 300 MG capsule Take 1 cap in AM, 1 cap at noon, 2 caps at bedtime 360 capsule 3  . Glucosamine-Chondroitin (MOVE FREE PO) Take 1 tablet by mouth daily.    . hydrochlorothiazide (HYDRODIURIL) 25 MG tablet TAKE 1 TABLET BY MOUTH EVERY DAY 90 tablet 0  . ketoconazole  (NIZORAL) 2 % cream APPLY TO AFFECTED AREA(S) TWICE A DAY WHEN FLARED    . loratadine (CLARITIN) 10 MG tablet Take 10 mg by mouth at bedtime.     [name] losartan (COZAAR) 100 MG tablet TAKE 1 TABLET BY MOUTH EVERY DAY (Patient taking differently: Take 100 mg by mouth at bedtime. ) 90 tablet 3  . meclizine (ANTIVERT) 25 MG tablet Take 1 tablet (25 mg total) by mouth 3 (three) times daily as needed for dizziness. 30 tablet 0  . miconazole (MICOTIN) 2 % powder Apply topically as needed for itching. TO GROIN AREA CROTCH ITCH AREA RED NO DRAINAGE    . omeprazole (PRILOSEC) 20 MG capsule TAKE 1 CAPSULE BY MOUTH EVERY DAY 90 capsule 1  . ondansetron (ZOFRAN ODT) 8 MG disintegrating tablet Take 1 tablet (8 mg total) by mouth every 8 (eight) hours as needed for nausea or vomiting. 20 tablet 0  . polyethylene glycol powder (GLYCOLAX/MIRALAX) powder Take 17 g by mouth daily. (Patient taking differently: Take 17 g by mouth 2 (two) times daily. ) 255 g 0  . Probiotic Product (ALIGN) 4 MG CAPS Take 4 mg by mouth daily.     . traMADol (ULTRAM) 50 MG tablet Take 1 tablet (50 mg total) by mouth every 6 (six) hours as needed. 20 tablet 1  . vitamin B-12 (CYANOCOBALAMIN) 1000 MCG tablet Take 1,000 mcg by mouth daily.    [name] VITAMIN D PO Take 4,000 Units by mouth daily.      No current facility-administered medications for this visit.    Allergies:   Benadryl [diphenhydramine], Bromfed, Clarithromycin, Codeine, Doxycycline hyclate, Modafinil, Oxycodone-acetaminophen, Promethazine hcl, Quinolones, Telithromycin, Hydrocodone-acetaminophen, and Sulfonamide derivatives    Social History:  The patient  reports that he quit smoking about 26 years ago. His smoking use included cigarettes. He has a 35.00 pack-year smoking history. He has never used smokeless tobacco. He reports current alcohol use of about 7.0 standard drinks of alcohol per week. He reports that he does not use drugs.   Family History:  The patient's family  history includes Diabetes in his brother; Heart disease in his father and mother; Hyperlipidemia in his father and mother.    ROS:  Please see the history of present illness.   Otherwise, review of systems are positive for none.   All other systems are reviewed and negative.    PHYSICAL EXAM: VS:  BP 132/90   Pulse 74   Ht 5' 6.5" (1.689 m)   [facility]  202 lb (91.6 kg)   BMI 32.12 kg/m  , BMI Body mass index is 32.12 kg/m. GENERAL:  Well appearing NECK:  No jugular venous distention, waveform within normal limits, carotid upstroke brisk and symmetric, no bruits, no thyromegaly LUNGS:  Clear to auscultation bilaterally CHEST:  Well healed sternotomy scar. HEART:  PMI not displaced or sustained,S1 and S2 within normal limits, no S3, no S4, no clicks, no rubs, no murmurs ABD:  Flat, positive bowel sounds normal in frequency in pitch, no bruits, no rebound, no guarding, no midline pulsatile mass, no hepatomegaly, no splenomegaly EXT:  2 plus pulses throughout, no edema, no cyanosis no clubbing  EKG:  EKG is ordered today. The ekg ordered today demonstrates sinus rhythm, rate 74, leftward axis, no acute ST-T wave changes.   Recent Labs: 10/27/2018: TSH 1.31 11/03/2018: ALT 22 12/28/2018: BUN 15; Creatinine, Ser 1.12; Hemoglobin 14.3; Platelets 204; Potassium 4.1; Sodium 140    Lipid Panel    Component Value Date/Time   CHOL 96 11/03/2018 0806   CHOL 81 (L) 12/30/2016 0848   TRIG 158.0 (H) 11/03/2018 0806   HDL 46.20 11/03/2018 0806   HDL 42 12/30/2016 0848   CHOLHDL 2 11/03/2018 0806   VLDL 31.6 11/03/2018 0806   LDLCALC 18 11/03/2018 0806   LDLCALC 16 10/01/2017 1703   LDLDIRECT 115.0 06/04/2016 0919      Wt Readings from Last 3 Encounters:  04/07/19 202 lb (91.6 kg)  03/06/19 218 lb (98.9 kg)  03/02/19 218 lb (98.9 kg)      Other studies Reviewed: Additional studies/ records that were reviewed today include: Labs. Review of the above records demonstrates:  Please see  elsewhere in the note.     ASSESSMENT AND PLAN:  CAD: The patient has symptoms consistent with unstable angina.  Cardiac cath is indicated. The patient understands that risks included but are not limited to stroke (1 in 1000), death (1 in 68), kidney failure [usually temporary] (1 in 500), bleeding (1 in 200), allergic reaction [possibly serious] (1 in 200).  The patient understands and agrees to proceed.   HTN:   The blood pressure is well controlled.  He will continue the meds as listed.  DYSLIPIDEMIA: His lipids are excellent.  No change in therapy.  COVID 19 EDUCATION:    He is scheduled to get the vaccine at the end of this month.   Current medicines are reviewed at length with the patient today.  The patient does not have concerns regarding medicines.  The following changes have been made:  no change  Labs/ tests ordered today include: None  Orders Placed This Encounter  Procedures  . CBC  . Basic Metabolic Panel (BMET)  . EKG 12-Lead  . LEFT HEART CATH     Disposition:   FU with me after the cath.     Signed, [name], MD  04/07/2019 8:47 AM    [facility]

## 2019-04-05 NOTE — H&P (View-Only) (Signed)
Cardiology Office Note   Date:  04/07/2019   ID:  Joshua [name], DOB 19-Jan-1941, MRN [id number]  PCP:  Joshua Olp, MD  Cardiologist:   Joshua Breeding, MD   Chief Complaint  Patient presents with  . Chest Pain      History of Present Illness: Joshua [name] is a 79 y.o. male who presents for follow up of CAD.  He had a negative perfusion study in 2015.  When I last saw him in Jan of 2020 he was having chest pain.   He had a POET (Plain Old Exercise Treadmill) and he had no ischemia.    He has been getting chest discomfort with activity.  This started initially when he would be at the end of the walk.  However, this week a couple of days ago he walked little more vigorously and the pain started at the beginning of the walk.  This is new onset exertional chest discomfort.  It is across his mid chest.  It is sharp to dull.  It can be 7 out of 10 in intensity.  There is no radiation to his jaw or to his arms.  He does not get new shortness of breath, diaphoresis or nausea.  It can take a couple of hours to go away.  He actually has had a little resting discomfort.  He is not had symptoms like this before that he can recall.  His predominant complaint was decreased exercise tolerance and fatigue when he had his coronary disease previously.  He called to be added to the schedule because of this discomfort.   Past Medical History:  Diagnosis Date  . Acute medial meniscal tear   . Anemia   . Arthritis    "middle finger right hand; right knee; neck" (02/06/2014)  . Asthma   . Bladder cancer ([place]) 04/2010   "cauterized during prostate OR"  . CAD (coronary artery disease)    CABG 2001  . Depression   . Diverticulosis   . DJD (degenerative joint disease)    BACK  . GERD (gastroesophageal reflux disease)   . H/O inguinal hernia repair 12/2018  . History of gout   . Hyperlipidemia   . Hypertension   . IBS (irritable bowel syndrome)   . Obesity   . OSA (obstructive sleep  apnea)    USES CPAP   . Pancreatitis ~ 1980  . Prostate cancer ([place]) 04/2010    Past Surgical History:  Procedure Laterality Date  . ANTERIOR CERVICAL DECOMP/DISCECTOMY FUSION  05/26/2006   PLATE PLACED  . CARDIAC CATHETERIZATION  11/20/1999  . CORONARY ARTERY BYPASS GRAFT  11/20/1999   SVG-RI1-RI2, SVG-OM, SVG-dRCA  . INGUINAL HERNIA REPAIR Left 01/02/2019   Procedure: OPEN LEFT INGUINAL HERNIA REPAIR WITH MESH;  Surgeon: Johnathan Hausen, MD;  Location: [facility];  Service: General;  Laterality: Left;  . KNEE ARTHROSCOPY Right 1992; 04/2008  . LAPAROSCOPIC CHOLECYSTECTOMY  2007  . NASAL SEPTUM SURGERY Right 2007  . ROBOT ASSISTED LAPAROSCOPIC RADICAL PROSTATECTOMY  04/2010  . THYROIDECTOMY, PARTIAL  1980     Current Outpatient Medications  Medication Sig Dispense Refill  . acetaminophen (TYLENOL) 500 MG tablet Take 1,000 mg by mouth every 6 (six) hours as needed for moderate pain or headache.     . albuterol (PROAIR HFA) 108 (90 BASE) MCG/ACT inhaler Inhale 1-2 puffs into the lungs every 6 (six) hours as needed for wheezing or shortness of breath. 18 g 3  . aspirin EC 81  MG tablet Take 81 mg by mouth daily.    . diazepam (VALIUM) 5 MG tablet Take 1 tablet (5 mg total) by mouth at bedtime as needed. 30 tablet 1  . Evolocumab (REPATHA SURECLICK) [id number] MG/ML SOAJ Inject 140 mg into the skin every 14 (fourteen) days. 6 pen 3  . FLOVENT HFA 110 MCG/ACT inhaler Inhale 2 puffs into the lungs 2 (two) times daily. Reported on 03/22/2015  5  . fluticasone (FLONASE) 50 MCG/ACT nasal spray Place 1 spray into both nostrils 2 (two) times daily.     . folic acid (FOLVITE) [id number] MCG tablet Take 800 mcg by mouth daily.     [name] gabapentin (NEURONTIN) 300 MG capsule Take 1 cap in AM, 1 cap at noon, 2 caps at bedtime 360 capsule 3  . Glucosamine-Chondroitin (MOVE FREE PO) Take 1 tablet by mouth daily.    . hydrochlorothiazide (HYDRODIURIL) 25 MG tablet TAKE 1 TABLET BY MOUTH EVERY DAY 90 tablet 0  . ketoconazole  (NIZORAL) 2 % cream APPLY TO AFFECTED AREA(S) TWICE A DAY WHEN FLARED    . loratadine (CLARITIN) 10 MG tablet Take 10 mg by mouth at bedtime.     [name] losartan (COZAAR) 100 MG tablet TAKE 1 TABLET BY MOUTH EVERY DAY (Patient taking differently: Take 100 mg by mouth at bedtime. ) 90 tablet 3  . meclizine (ANTIVERT) 25 MG tablet Take 1 tablet (25 mg total) by mouth 3 (three) times daily as needed for dizziness. 30 tablet 0  . miconazole (MICOTIN) 2 % powder Apply topically as needed for itching. TO GROIN AREA CROTCH ITCH AREA RED NO DRAINAGE    . omeprazole (PRILOSEC) 20 MG capsule TAKE 1 CAPSULE BY MOUTH EVERY DAY 90 capsule 1  . ondansetron (ZOFRAN ODT) 8 MG disintegrating tablet Take 1 tablet (8 mg total) by mouth every 8 (eight) hours as needed for nausea or vomiting. 20 tablet 0  . polyethylene glycol powder (GLYCOLAX/MIRALAX) powder Take 17 g by mouth daily. (Patient taking differently: Take 17 g by mouth 2 (two) times daily. ) 255 g 0  . Probiotic Product (ALIGN) 4 MG CAPS Take 4 mg by mouth daily.     . traMADol (ULTRAM) 50 MG tablet Take 1 tablet (50 mg total) by mouth every 6 (six) hours as needed. 20 tablet 1  . vitamin B-12 (CYANOCOBALAMIN) 1000 MCG tablet Take 1,000 mcg by mouth daily.    [name] VITAMIN D PO Take 4,000 Units by mouth daily.      No current facility-administered medications for this visit.    Allergies:   Benadryl [diphenhydramine], Bromfed, Clarithromycin, Codeine, Doxycycline hyclate, Modafinil, Oxycodone-acetaminophen, Promethazine hcl, Quinolones, Telithromycin, Hydrocodone-acetaminophen, and Sulfonamide derivatives    Social History:  The patient  reports that he quit smoking about 26 years ago. His smoking use included cigarettes. He has a 35.00 pack-year smoking history. He has never used smokeless tobacco. He reports current alcohol use of about 7.0 standard drinks of alcohol per week. He reports that he does not use drugs.   Family History:  The patient's family  history includes Diabetes in his brother; Heart disease in his father and mother; Hyperlipidemia in his father and mother.    ROS:  Please see the history of present illness.   Otherwise, review of systems are positive for none.   All other systems are reviewed and negative.    PHYSICAL EXAM: VS:  BP 132/90   Pulse 74   Ht 5' 6.5" (1.689 m)   [facility]  202 lb (91.6 kg)   BMI 32.12 kg/m  , BMI Body mass index is 32.12 kg/m. GENERAL:  Well appearing NECK:  No jugular venous distention, waveform within normal limits, carotid upstroke brisk and symmetric, no bruits, no thyromegaly LUNGS:  Clear to auscultation bilaterally CHEST:  Well healed sternotomy scar. HEART:  PMI not displaced or sustained,S1 and S2 within normal limits, no S3, no S4, no clicks, no rubs, no murmurs ABD:  Flat, positive bowel sounds normal in frequency in pitch, no bruits, no rebound, no guarding, no midline pulsatile mass, no hepatomegaly, no splenomegaly EXT:  2 plus pulses throughout, no edema, no cyanosis no clubbing  EKG:  EKG is ordered today. The ekg ordered today demonstrates sinus rhythm, rate 74, leftward axis, no acute ST-T wave changes.   Recent Labs: 10/27/2018: TSH 1.31 11/03/2018: ALT 22 12/28/2018: BUN 15; Creatinine, Ser 1.12; Hemoglobin 14.3; Platelets 204; Potassium 4.1; Sodium 140    Lipid Panel    Component Value Date/Time   CHOL 96 11/03/2018 0806   CHOL 81 (L) 12/30/2016 0848   TRIG 158.0 (H) 11/03/2018 0806   HDL 46.20 11/03/2018 0806   HDL 42 12/30/2016 0848   CHOLHDL 2 11/03/2018 0806   VLDL 31.6 11/03/2018 0806   LDLCALC 18 11/03/2018 0806   LDLCALC 16 10/01/2017 1703   LDLDIRECT 115.0 06/04/2016 0919      Wt Readings from Last 3 Encounters:  04/07/19 202 lb (91.6 kg)  03/06/19 218 lb (98.9 kg)  03/02/19 218 lb (98.9 kg)      Other studies Reviewed: Additional studies/ records that were reviewed today include: Labs. Review of the above records demonstrates:  Please see  elsewhere in the note.     ASSESSMENT AND PLAN:  CAD: The patient has symptoms consistent with unstable angina.  Cardiac cath is indicated. The patient understands that risks included but are not limited to stroke (1 in 1000), death (1 in 75), kidney failure [usually temporary] (1 in 500), bleeding (1 in 200), allergic reaction [possibly serious] (1 in 200).  The patient understands and agrees to proceed.   HTN:   The blood pressure is well controlled.  He will continue the meds as listed.  DYSLIPIDEMIA: His lipids are excellent.  No change in therapy.  COVID 19 EDUCATION:    He is scheduled to get the vaccine at the end of this month.   Current medicines are reviewed at length with the patient today.  The patient does not have concerns regarding medicines.  The following changes have been made:  no change  Labs/ tests ordered today include: None  Orders Placed This Encounter  Procedures  . CBC  . Basic Metabolic Panel (BMET)  . EKG 12-Lead  . LEFT HEART CATH     Disposition:   FU with me after the cath.     Signed, Joshua Breeding, MD  04/07/2019 8:47 AM    [facility]

## 2019-04-07 ENCOUNTER — Other Ambulatory Visit: Payer: Self-pay

## 2019-04-07 ENCOUNTER — Ambulatory Visit (INDEPENDENT_AMBULATORY_CARE_PROVIDER_SITE_OTHER): Payer: PPO | Admitting: Cardiology

## 2019-04-07 ENCOUNTER — Encounter: Payer: Self-pay | Admitting: Cardiology

## 2019-04-07 VITALS — BP 132/90 | HR 74 | Ht 66.5 in | Wt 202.0 lb

## 2019-04-07 DIAGNOSIS — I2510 Atherosclerotic heart disease of native coronary artery without angina pectoris: Secondary | ICD-10-CM | POA: Diagnosis not present

## 2019-04-07 DIAGNOSIS — J3089 Other allergic rhinitis: Secondary | ICD-10-CM | POA: Diagnosis not present

## 2019-04-07 DIAGNOSIS — Z7189 Other specified counseling: Secondary | ICD-10-CM | POA: Diagnosis not present

## 2019-04-07 DIAGNOSIS — I200 Unstable angina: Secondary | ICD-10-CM | POA: Diagnosis not present

## 2019-04-07 DIAGNOSIS — E785 Hyperlipidemia, unspecified: Secondary | ICD-10-CM | POA: Diagnosis not present

## 2019-04-07 DIAGNOSIS — J301 Allergic rhinitis due to pollen: Secondary | ICD-10-CM | POA: Diagnosis not present

## 2019-04-07 DIAGNOSIS — Z01812 Encounter for preprocedural laboratory examination: Secondary | ICD-10-CM

## 2019-04-07 DIAGNOSIS — I10 Essential (primary) hypertension: Secondary | ICD-10-CM | POA: Diagnosis not present

## 2019-04-07 DIAGNOSIS — J3081 Allergic rhinitis due to animal (cat) (dog) hair and dander: Secondary | ICD-10-CM | POA: Diagnosis not present

## 2019-04-07 LAB — CBC
Hematocrit: 42.9 % (ref 37.5–51.0)
Hemoglobin: 14.9 g/dL (ref 13.0–17.7)
MCH: 32.6 pg (ref 26.6–33.0)
MCHC: 34.7 g/dL (ref 31.5–35.7)
MCV: 94 fL (ref 79–97)
Platelets: 195 10*3/uL (ref 150–450)
RBC: 4.57 x10E6/uL (ref 4.14–5.80)
RDW: 12 % (ref 11.6–15.4)
WBC: 6.1 10*3/uL (ref 3.4–10.8)

## 2019-04-07 LAB — BASIC METABOLIC PANEL
BUN/Creatinine Ratio: 12 (ref 10–24)
BUN: 15 mg/dL (ref 8–27)
CO2: 27 mmol/L (ref 20–29)
Calcium: 9.8 mg/dL (ref 8.6–10.2)
Chloride: 99 mmol/L (ref 96–106)
Creatinine, Ser: 1.25 mg/dL (ref 0.76–1.27)
GFR calc Af Amer: 64 mL/min/{1.73_m2} (ref >59)
GFR calc non Af Amer: 56 mL/min/{1.73_m2} — ABNORMAL LOW (ref >59)
Glucose: 74 mg/dL (ref 65–99)
Potassium: 4.1 mmol/L (ref 3.5–5.2)
Sodium: 138 mmol/L (ref 134–144)

## 2019-04-07 NOTE — Patient Instructions (Signed)
Medication Instructions:  No changes *If you need a refill on your cardiac medications before your next appointment, please call your pharmacy*  Lab Work: Your physician recommends that you return for lab work today (CBC, BMP)  If you have labs (blood work) drawn today and your tests are completely normal, you will receive your results only by: [name] MyChart Message (if you have MyChart) OR . A paper copy in the mail If you have any lab test that is abnormal or we need to change your treatment, we will call you to review the results.  Testing/Procedures:  You are scheduled for a Covid Screening on Monday 04/10/19 at 09:40am.This is a Drive Up Visit at the [facility] [address], [place]. Someone will direct you to the appropriate testing line. Stay in your car and someone will be with you shortly   Follow-Up: At [facility], you and your health needs are our priority.  As part of our continuing mission to provide you with exceptional heart care, we have created designated Provider Care Teams.  These Care Teams include your primary Cardiologist (physician) and Advanced Practice Providers (APPs -  Physician Assistants and Nurse Practitioners) who all work together to provide you with the care you need, when you need it.    Other Instructions    [place] [address] Dept: [phone] Loc: [phone]  Joshua [name]  04/07/2019  You are scheduled for a Cardiac Catheterization on Thursday, January 21 with Dr. Lauree Chandler.  1. Please arrive at the [facility] (Main Entrance A) at [facility]: [address] [place], [place] [address] at 5:30 AM (This time is two hours before your procedure to ensure your preparation). Free valet parking service is available.   Special note: Every effort is made to have your procedure done on time.  Please understand that emergencies sometimes delay scheduled procedures.  2. Diet: Do not eat solid foods after midnight.  The patient may have clear liquids until 5am upon the day of the procedure.  3. Labs: You will need to have blood drawn on Friday, January 15 at [place], [place]  Open: [place] (Lunch 12:30 - 1:30)   Phone: [phone]. You do not need to be fasting.  4. Medication instructions in preparation for your procedure:   Contrast Allergy: No  Stop taking, HTCZ (Hydrochlorothiazide) Thursday, April 13, 2019   On the morning of your procedure, take your Aspirin and any morning medicines NOT listed above.  You may use sips of water.  5. Plan for one night stay--bring personal belongings. 6. Bring a current list of your medications and current insurance cards. 7. You MUST have a responsible person to drive you home. 8. Someone MUST be with you the first 24 hours after you arrive home or your discharge will be delayed. 9. Please wear clothes that are easy to get on and off and wear slip-on shoes.  Thank you for allowing [place] to care for you!   -- [facility]

## 2019-04-10 ENCOUNTER — Other Ambulatory Visit (HOSPITAL_COMMUNITY)
Admission: RE | Admit: 2019-04-10 | Discharge: 2019-04-10 | Disposition: A | Discharge status: Home / Self Care | Payer: PPO | Source: Ambulatory Visit | Attending: Cardiovascular Disease | Admitting: Cardiovascular Disease

## 2019-04-10 DIAGNOSIS — Z01812 Encounter for preprocedural laboratory examination: Secondary | ICD-10-CM | POA: Insufficient documentation

## 2019-04-10 DIAGNOSIS — Z20822 Contact with and (suspected) exposure to covid-19: Secondary | ICD-10-CM | POA: Insufficient documentation

## 2019-04-11 ENCOUNTER — Telehealth: Payer: Self-pay | Admitting: *Deleted

## 2019-04-11 LAB — NOVEL CORONAVIRUS, NAA (HOSP ORDER, SEND-OUT TO REF LAB; TAT 18-24 HRS): SARS-CoV-2, NAA: NOT DETECTED

## 2019-04-11 NOTE — Telephone Encounter (Addendum)
Pt contacted pre-catheterization scheduled at [facility] for: Thursday April 13, 2019 7:30 AM Verified arrival time and place: [place] [facility]) at: 5:30 AM   No solid food after midnight prior to cath, clear liquids until 5 AM day of procedure. Contrast allergy: no   Hold: Losartan-day before and day of procedure-GFR 54 HCTZ-day before and day of procedure-GFR 56  Except hold medications AM meds can be  taken pre-cath with sip of water including: ASA 81 mg   Confirmed patient has responsible adult to drive home post procedure and observe 24 hours after arriving home: yes  Currently, due to Covid-19 pandemic, only one support person will be allowed with patient. Must be the same support person for that patient's entire stay and will be required to wear a mask. They will be asked to wait in the waiting room for the duration of the patient's stay.  Patients are required to wear a mask when they enter the hospital.     COVID-19 Pre-Screening Questions:  . In the past 7 to 10 days have you had a cough,  shortness of breath, headache, congestion, fever (100 or greater) body aches, chills, sore throat, or sudden loss of taste or sense of smell? no . Have you been around anyone with known Covid 19? no . Have you been around anyone who is awaiting Covid 19 test results in the past 7 to 10 days? no . Have you been around anyone who has been exposed to Covid 19, or has mentioned symptoms of Covid 19 within the past 7 to 10 days? no   I reviewed procedure/mask/visitor instructions, Covid-19 screening questions with patient, he verbalized understanding, thanked me for call.

## 2019-04-13 ENCOUNTER — Other Ambulatory Visit: Payer: Self-pay

## 2019-04-13 ENCOUNTER — Ambulatory Visit (HOSPITAL_COMMUNITY)
Admission: RE | Disposition: A | Discharge status: Home / Self Care | Payer: Self-pay | Source: Home / Self Care | Attending: Cardiovascular Disease

## 2019-04-13 ENCOUNTER — Ambulatory Visit (HOSPITAL_COMMUNITY)
Admission: RE | Admit: 2019-04-13 | Discharge: 2019-04-14 | Disposition: A | Discharge status: Home / Self Care | Payer: PPO | Attending: Cardiovascular Disease | Admitting: Cardiovascular Disease

## 2019-04-13 DIAGNOSIS — I200 Unstable angina: Secondary | ICD-10-CM

## 2019-04-13 DIAGNOSIS — Z8249 Family history of ischemic heart disease and other diseases of the circulatory system: Secondary | ICD-10-CM | POA: Diagnosis not present

## 2019-04-13 DIAGNOSIS — G4733 Obstructive sleep apnea (adult) (pediatric): Secondary | ICD-10-CM | POA: Insufficient documentation

## 2019-04-13 DIAGNOSIS — Z7982 Long term (current) use of aspirin: Secondary | ICD-10-CM | POA: Insufficient documentation

## 2019-04-13 DIAGNOSIS — Z888 Allergy status to other drugs, medicaments and biological substances status: Secondary | ICD-10-CM | POA: Insufficient documentation

## 2019-04-13 DIAGNOSIS — I25110 Atherosclerotic heart disease of native coronary artery with unstable angina pectoris: Secondary | ICD-10-CM | POA: Diagnosis not present

## 2019-04-13 DIAGNOSIS — J45909 Unspecified asthma, uncomplicated: Secondary | ICD-10-CM | POA: Diagnosis not present

## 2019-04-13 DIAGNOSIS — I25710 Atherosclerosis of autologous vein coronary artery bypass graft(s) with unstable angina pectoris: Secondary | ICD-10-CM | POA: Diagnosis not present

## 2019-04-13 DIAGNOSIS — Z882 Allergy status to sulfonamides status: Secondary | ICD-10-CM | POA: Diagnosis not present

## 2019-04-13 DIAGNOSIS — E785 Hyperlipidemia, unspecified: Secondary | ICD-10-CM | POA: Diagnosis not present

## 2019-04-13 DIAGNOSIS — Z87891 Personal history of nicotine dependence: Secondary | ICD-10-CM | POA: Diagnosis not present

## 2019-04-13 DIAGNOSIS — Z79899 Other long term (current) drug therapy: Secondary | ICD-10-CM | POA: Diagnosis not present

## 2019-04-13 DIAGNOSIS — Z951 Presence of aortocoronary bypass graft: Secondary | ICD-10-CM | POA: Insufficient documentation

## 2019-04-13 DIAGNOSIS — Z7951 Long term (current) use of inhaled steroids: Secondary | ICD-10-CM | POA: Insufficient documentation

## 2019-04-13 DIAGNOSIS — I129 Hypertensive chronic kidney disease with stage 1 through stage 4 chronic kidney disease, or unspecified chronic kidney disease: Secondary | ICD-10-CM | POA: Diagnosis not present

## 2019-04-13 DIAGNOSIS — K219 Gastro-esophageal reflux disease without esophagitis: Secondary | ICD-10-CM | POA: Insufficient documentation

## 2019-04-13 DIAGNOSIS — K589 Irritable bowel syndrome without diarrhea: Secondary | ICD-10-CM | POA: Diagnosis not present

## 2019-04-13 DIAGNOSIS — Z885 Allergy status to narcotic agent status: Secondary | ICD-10-CM | POA: Insufficient documentation

## 2019-04-13 DIAGNOSIS — E669 Obesity, unspecified: Secondary | ICD-10-CM | POA: Diagnosis not present

## 2019-04-13 DIAGNOSIS — N183 Chronic kidney disease, stage 3 unspecified: Secondary | ICD-10-CM | POA: Insufficient documentation

## 2019-04-13 DIAGNOSIS — Z955 Presence of coronary angioplasty implant and graft: Secondary | ICD-10-CM

## 2019-04-13 DIAGNOSIS — Z6832 Body mass index (BMI) 32.0-32.9, adult: Secondary | ICD-10-CM | POA: Diagnosis not present

## 2019-04-13 HISTORY — PX: LEFT HEART CATH AND CORS/GRAFTS ANGIOGRAPHY: CATH118250

## 2019-04-13 HISTORY — PX: CORONARY STENT INTERVENTION: CATH118234

## 2019-04-13 LAB — POCT ACTIVATED CLOTTING TIME: Activated Clotting Time: 279 seconds

## 2019-04-13 SURGERY — LEFT HEART CATH AND CORS/GRAFTS ANGIOGRAPHY
Anesthesia: LOCAL

## 2019-04-13 MED ORDER — HYDROCHLOROTHIAZIDE 25 MG PO TABS
25.0000 mg | ORAL_TABLET | Freq: Every day | ORAL | Status: DC
  Administered 2019-04-13: 13:00:00 25 mg via ORAL
  Filled 2019-04-13: qty 1

## 2019-04-13 MED ORDER — SODIUM CHLORIDE 0.9 % IV SOLN: 250.0000 mL | INTRAVENOUS | Status: DC | PRN

## 2019-04-13 MED ORDER — ONDANSETRON HCL 4 MG/2ML IJ SOLN: 4.0000 mg | Freq: Four times a day (QID) | INTRAMUSCULAR | Status: DC | PRN

## 2019-04-13 MED ORDER — FAMOTIDINE IN NACL 20-0.9 MG/50ML-% IV SOLN
INTRAVENOUS | Status: AC
  Filled 2019-04-13: qty 50

## 2019-04-13 MED ORDER — FLUTICASONE PROPIONATE 50 MCG/ACT NA SUSP
1.0000 | Freq: Two times a day (BID) | NASAL | Status: DC
  Administered 2019-04-13 – 2019-04-14 (×2): 1 via NASAL
  Filled 2019-04-13: qty 16

## 2019-04-13 MED ORDER — MIDAZOLAM HCL 2 MG/2ML IJ SOLN
INTRAMUSCULAR | Status: AC
  Filled 2019-04-13: qty 2

## 2019-04-13 MED ORDER — SODIUM CHLORIDE 0.9% FLUSH
3.0000 mL | Freq: Two times a day (BID) | INTRAVENOUS | Status: DC
  Administered 2019-04-13: 22:00:00 3 mL via INTRAVENOUS

## 2019-04-13 MED ORDER — LIDOCAINE HCL (PF) 1 % IJ SOLN
INTRAMUSCULAR | Status: DC | PRN
  Administered 2019-04-13: 15 mL
  Administered 2019-04-13: 2 mL

## 2019-04-13 MED ORDER — SODIUM CHLORIDE 0.9 % WEIGHT BASED INFUSION: 1.0000 mL/kg/h | INTRAVENOUS | Status: DC

## 2019-04-13 MED ORDER — SODIUM CHLORIDE 0.9% FLUSH: 3.0000 mL | INTRAVENOUS | Status: DC | PRN

## 2019-04-13 MED ORDER — ALBUTEROL SULFATE HFA 108 (90 BASE) MCG/ACT IN AERS
1.0000 | INHALATION_SPRAY | Freq: Four times a day (QID) | RESPIRATORY_TRACT | Status: DC | PRN
  Filled 2019-04-13: qty 6.7

## 2019-04-13 MED ORDER — CLOPIDOGREL BISULFATE 75 MG PO TABS
75.0000 mg | ORAL_TABLET | Freq: Every day | ORAL | Status: DC
  Administered 2019-04-14: 09:00:00 75 mg via ORAL
  Filled 2019-04-13: qty 1

## 2019-04-13 MED ORDER — ADENOSINE (DIAGNOSTIC) FOR INTRACORONARY USE
INTRAVENOUS | Status: DC | PRN
  Administered 2019-04-13 (×3): 48 ug via INTRACORONARY

## 2019-04-13 MED ORDER — IOHEXOL 350 MG/ML SOLN
INTRAVENOUS | Status: DC | PRN
  Administered 2019-04-13: 09:00:00 125 mL

## 2019-04-13 MED ORDER — FENTANYL CITRATE (PF) 100 MCG/2ML IJ SOLN
INTRAMUSCULAR | Status: DC | PRN
  Administered 2019-04-13: 50 ug via INTRAVENOUS

## 2019-04-13 MED ORDER — LORATADINE 10 MG PO TABS
10.0000 mg | ORAL_TABLET | Freq: Every day | ORAL | Status: DC
  Administered 2019-04-13: 10 mg via ORAL
  Filled 2019-04-13: qty 1

## 2019-04-13 MED ORDER — LOSARTAN POTASSIUM 50 MG PO TABS
100.0000 mg | ORAL_TABLET | Freq: Every day | ORAL | Status: DC
  Administered 2019-04-13: 22:00:00 100 mg via ORAL
  Filled 2019-04-13: qty 2

## 2019-04-13 MED ORDER — ASPIRIN 81 MG PO CHEW: 81.0000 mg | CHEWABLE_TABLET | ORAL | Status: AC

## 2019-04-13 MED ORDER — IOHEXOL 350 MG/ML SOLN
INTRAVENOUS | Status: AC
  Filled 2019-04-13: qty 1

## 2019-04-13 MED ORDER — ALBUTEROL SULFATE (2.5 MG/3ML) 0.083% IN NEBU: 3.0000 mL | INHALATION_SOLUTION | Freq: Four times a day (QID) | RESPIRATORY_TRACT | Status: DC | PRN

## 2019-04-13 MED ORDER — HEPARIN SODIUM (PORCINE) 1000 UNIT/ML IJ SOLN
INTRAMUSCULAR | Status: DC | PRN
  Administered 2019-04-13: 10000 [IU] via INTRAVENOUS

## 2019-04-13 MED ORDER — CLOPIDOGREL BISULFATE 300 MG PO TABS
ORAL_TABLET | ORAL | Status: DC | PRN
  Administered 2019-04-13: 600 mg via ORAL

## 2019-04-13 MED ORDER — HEPARIN (PORCINE) IN NACL 1000-0.9 UT/500ML-% IV SOLN
INTRAVENOUS | Status: DC | PRN
  Administered 2019-04-13 (×2): 500 mL

## 2019-04-13 MED ORDER — CLOPIDOGREL BISULFATE 300 MG PO TABS
ORAL_TABLET | ORAL | Status: AC
  Filled 2019-04-13: qty 2

## 2019-04-13 MED ORDER — HEPARIN (PORCINE) IN NACL 1000-0.9 UT/500ML-% IV SOLN
INTRAVENOUS | Status: AC
  Filled 2019-04-13: qty 1000

## 2019-04-13 MED ORDER — PANTOPRAZOLE SODIUM 40 MG PO TBEC
40.0000 mg | DELAYED_RELEASE_TABLET | Freq: Every day | ORAL | Status: DC
  Administered 2019-04-14: 09:00:00 40 mg via ORAL
  Filled 2019-04-13 (×2): qty 1

## 2019-04-13 MED ORDER — HYDRALAZINE HCL 20 MG/ML IJ SOLN: 10.0000 mg | INTRAMUSCULAR | Status: AC | PRN

## 2019-04-13 MED ORDER — VERAPAMIL HCL 2.5 MG/ML IV SOLN
INTRAVENOUS | Status: AC
  Filled 2019-04-13: qty 2

## 2019-04-13 MED ORDER — ASPIRIN 81 MG PO CHEW
CHEWABLE_TABLET | ORAL | Status: AC
  Administered 2019-04-13: 07:00:00 81 mg via ORAL
  Filled 2019-04-13: qty 1

## 2019-04-13 MED ORDER — GABAPENTIN 300 MG PO CAPS
300.0000 mg | ORAL_CAPSULE | Freq: Three times a day (TID) | ORAL | Status: DC
  Administered 2019-04-13: 13:00:00 300 mg via ORAL
  Filled 2019-04-13 (×2): qty 1

## 2019-04-13 MED ORDER — GABAPENTIN 300 MG PO CAPS
600.0000 mg | ORAL_CAPSULE | Freq: Every day | ORAL | Status: DC
  Administered 2019-04-13: 22:00:00 600 mg via ORAL
  Filled 2019-04-13 (×2): qty 2

## 2019-04-13 MED ORDER — FENTANYL CITRATE (PF) 100 MCG/2ML IJ SOLN
INTRAMUSCULAR | Status: AC
  Filled 2019-04-13: qty 2

## 2019-04-13 MED ORDER — SODIUM CHLORIDE 0.9 % WEIGHT BASED INFUSION
3.0000 mL/kg/h | INTRAVENOUS | Status: DC
  Administered 2019-04-13: 07:00:00 3 mL/kg/h via INTRAVENOUS

## 2019-04-13 MED ORDER — GABAPENTIN 300 MG PO CAPS
300.0000 mg | ORAL_CAPSULE | Freq: Two times a day (BID) | ORAL | Status: DC
  Administered 2019-04-14: 09:00:00 300 mg via ORAL
  Filled 2019-04-13: qty 1

## 2019-04-13 MED ORDER — LABETALOL HCL 5 MG/ML IV SOLN: 10.0000 mg | INTRAVENOUS | Status: AC | PRN

## 2019-04-13 MED ORDER — HEPARIN SODIUM (PORCINE) 1000 UNIT/ML IJ SOLN
INTRAMUSCULAR | Status: AC
  Filled 2019-04-13: qty 1

## 2019-04-13 MED ORDER — SODIUM CHLORIDE 0.9 % IV SOLN: INTRAVENOUS | Status: AC

## 2019-04-13 MED ORDER — ADENOSINE 6 MG/2ML IV SOLN
INTRAVENOUS | Status: AC
  Filled 2019-04-13: qty 2

## 2019-04-13 MED ORDER — ASPIRIN EC 81 MG PO TBEC: 81.0000 mg | DELAYED_RELEASE_TABLET | Freq: Every day | ORAL | Status: DC

## 2019-04-13 MED ORDER — LIDOCAINE HCL (PF) 1 % IJ SOLN
INTRAMUSCULAR | Status: AC
  Filled 2019-04-13: qty 30

## 2019-04-13 MED ORDER — MIDAZOLAM HCL 2 MG/2ML IJ SOLN
INTRAMUSCULAR | Status: DC | PRN
  Administered 2019-04-13: 1 mg via INTRAVENOUS
  Administered 2019-04-13: 2 mg via INTRAVENOUS

## 2019-04-13 MED ORDER — FAMOTIDINE IN NACL 20-0.9 MG/50ML-% IV SOLN
INTRAVENOUS | Status: AC | PRN
  Administered 2019-04-13: 20 mg via INTRAVENOUS

## 2019-04-13 SURGICAL SUPPLY — 20 items
BALLN SAPPHIRE 2.0X12 (BALLOONS) ×2
BALLOON SAPPHIRE 2.0X12 (BALLOONS) IMPLANT
CATH INFINITI 5FR MULTPACK ANG (CATHETERS) ×1 IMPLANT
CATH VISTA GUIDE 6FR JR4 SH (CATHETERS) ×1 IMPLANT
CLOSURE MYNX CONTROL 6F/7F (Vascular Products) ×1 IMPLANT
GLIDESHEATH SLEND SS 6F .021 (SHEATH) ×1 IMPLANT
GUIDEWIRE INQWIRE 1.5J.035X260 (WIRE) IMPLANT
INQWIRE 1.5J .035X260CM (WIRE) ×2
KIT ENCORE 26 ADVANTAGE (KITS) ×1 IMPLANT
KIT HEART LEFT (KITS) ×2 IMPLANT
PACK CARDIAC CATHETERIZATION (CUSTOM PROCEDURE TRAY) ×2 IMPLANT
SHEATH PINNACLE 5F 10CM (SHEATH) ×1 IMPLANT
SHEATH PINNACLE 6F 10CM (SHEATH) ×1 IMPLANT
SHEATH PROBE COVER 6X72 (BAG) ×1 IMPLANT
STENT RESOLUTE ONYX 3.0X18 (Permanent Stent) ×1 IMPLANT
SYR MEDRAD MARK 7 150ML (SYRINGE) ×2 IMPLANT
TRANSDUCER W/STOPCOCK (MISCELLANEOUS) ×2 IMPLANT
TUBING CIL FLEX 10 FLL-RA (TUBING) ×2 IMPLANT
WIRE COUGAR XT STRL 190CM (WIRE) ×1 IMPLANT
WIRE EMERALD 3MM-J .035X150CM (WIRE) ×1 IMPLANT

## 2019-04-13 NOTE — Progress Notes (Signed)
BR complete. Up to side of bed to void with urinal. Tolerates well. Rt groin site level 0.

## 2019-04-13 NOTE — Progress Notes (Signed)
Manual pressure held at cath site for 15 min. Hematoma resolved. New tegaderm with guaze dressing applied. BR restarted x 2 hours. Pt instructed to lay flat with legs straight and HOB flat. Call Bell in reach. Instructed to call for any pain, swelling, bleeding or other needs. Verbalized understanding.

## 2019-04-13 NOTE — Progress Notes (Signed)
Checked his cath site, hematoma stable. No further pain, no sign of enlarging hematoma. Groin soft. Previous Gauze is soaked with blood, however it does appear that bleeding has stopped. Will continue observation. I instructed the patient to lay still

## 2019-04-13 NOTE — Progress Notes (Signed)
OOB to BR. Blood noted on pts arm. Groin checked, bright red blood noted on Guaze dressing and tegaderm. Back to bed moderate size hematoma approx. 2 " x 1 1/2 inch. Pressure held at site.

## 2019-04-13 NOTE — Interval H&P Note (Signed)
History and Physical Interval Note:  04/13/2019 7:24 AM  Joshua [name]  has presented today for surgery, with the diagnosis of unstable angina.  The various methods of treatment have been discussed with the patient and family. After consideration of risks, benefits and other options for treatment, the patient has consented to  Procedure(s): LEFT HEART CATH AND CORONARY ANGIOGRAPHY (N/A) as a surgical intervention.  The patient's history has been reviewed, patient examined, no change in status, stable for surgery.  I have reviewed the patient's chart and labs.  Questions were answered to the patient's satisfaction.    Cath Lab Visit (complete for each Cath Lab visit)  Clinical Evaluation Leading to the Procedure:   ACS: No.  Non-ACS:    Anginal Classification: CCS III  Anti-ischemic medical therapy: No Therapy  Non-Invasive Test Results: No non-invasive testing performed  Prior CABG: Previous CABG        Lauree Chandler

## 2019-04-14 ENCOUNTER — Other Ambulatory Visit: Payer: Self-pay | Admitting: Cardiology

## 2019-04-14 DIAGNOSIS — I200 Unstable angina: Secondary | ICD-10-CM

## 2019-04-14 DIAGNOSIS — N183 Chronic kidney disease, stage 3 unspecified: Secondary | ICD-10-CM

## 2019-04-14 DIAGNOSIS — I25110 Atherosclerotic heart disease of native coronary artery with unstable angina pectoris: Secondary | ICD-10-CM | POA: Diagnosis not present

## 2019-04-14 DIAGNOSIS — K219 Gastro-esophageal reflux disease without esophagitis: Secondary | ICD-10-CM | POA: Diagnosis not present

## 2019-04-14 DIAGNOSIS — Z951 Presence of aortocoronary bypass graft: Secondary | ICD-10-CM | POA: Diagnosis not present

## 2019-04-14 DIAGNOSIS — I129 Hypertensive chronic kidney disease with stage 1 through stage 4 chronic kidney disease, or unspecified chronic kidney disease: Secondary | ICD-10-CM | POA: Diagnosis not present

## 2019-04-14 DIAGNOSIS — J45909 Unspecified asthma, uncomplicated: Secondary | ICD-10-CM | POA: Diagnosis not present

## 2019-04-14 DIAGNOSIS — Z8249 Family history of ischemic heart disease and other diseases of the circulatory system: Secondary | ICD-10-CM | POA: Diagnosis not present

## 2019-04-14 DIAGNOSIS — K589 Irritable bowel syndrome without diarrhea: Secondary | ICD-10-CM | POA: Diagnosis not present

## 2019-04-14 DIAGNOSIS — Z87891 Personal history of nicotine dependence: Secondary | ICD-10-CM | POA: Diagnosis not present

## 2019-04-14 DIAGNOSIS — E785 Hyperlipidemia, unspecified: Secondary | ICD-10-CM | POA: Diagnosis not present

## 2019-04-14 DIAGNOSIS — E669 Obesity, unspecified: Secondary | ICD-10-CM | POA: Diagnosis not present

## 2019-04-14 DIAGNOSIS — G4733 Obstructive sleep apnea (adult) (pediatric): Secondary | ICD-10-CM | POA: Diagnosis not present

## 2019-04-14 LAB — CBC
HCT: 39.5 % (ref 39.0–52.0)
Hemoglobin: 13.9 g/dL (ref 13.0–17.0)
MCH: 32.1 pg (ref 26.0–34.0)
MCHC: 35.2 g/dL (ref 30.0–36.0)
MCV: 91.2 fL (ref 80.0–100.0)
Platelets: 183 10*3/uL (ref 150–400)
RBC: 4.33 MIL/uL (ref 4.22–5.81)
RDW: 11.8 % (ref 11.5–15.5)
WBC: 6.4 10*3/uL (ref 4.0–10.5)
nRBC: 0 % (ref 0.0–0.2)

## 2019-04-14 LAB — BASIC METABOLIC PANEL
Anion gap: 10 (ref 5–15)
BUN: 13 mg/dL (ref 8–23)
CO2: 27 mmol/L (ref 22–32)
Calcium: 9.1 mg/dL (ref 8.9–10.3)
Chloride: 102 mmol/L (ref 98–111)
Creatinine, Ser: 1.4 mg/dL — ABNORMAL HIGH (ref 0.61–1.24)
GFR calc Af Amer: 56 mL/min — ABNORMAL LOW (ref >60)
GFR calc non Af Amer: 48 mL/min — ABNORMAL LOW (ref >60)
Glucose, Bld: 120 mg/dL — ABNORMAL HIGH (ref 70–99)
Potassium: 3.6 mmol/L (ref 3.5–5.1)
Sodium: 139 mmol/L (ref 135–145)

## 2019-04-14 MED ORDER — METOPROLOL TARTRATE 25 MG PO TABS: 12.5000 mg | ORAL_TABLET | Freq: Two times a day (BID) | ORAL | 12 refills | Status: DC

## 2019-04-14 MED ORDER — ANGIOPLASTY BOOK
Freq: Once | Status: AC
  Filled 2019-04-14: qty 1

## 2019-04-14 MED ORDER — ALBUTEROL SULFATE HFA 108 (90 BASE) MCG/ACT IN AERS
1.0000 | INHALATION_SPRAY | Freq: Four times a day (QID) | RESPIRATORY_TRACT | Status: DC | PRN
  Administered 2019-04-14 (×2): 1 via RESPIRATORY_TRACT
  Filled 2019-04-14: qty 6.7

## 2019-04-14 MED ORDER — PANTOPRAZOLE SODIUM 40 MG PO TBEC: 40.0000 mg | DELAYED_RELEASE_TABLET | Freq: Every day | ORAL | 1 refills | Status: DC

## 2019-04-14 MED ORDER — METOPROLOL TARTRATE 12.5 MG HALF TABLET
12.5000 mg | ORAL_TABLET | Freq: Two times a day (BID) | ORAL | Status: DC
  Administered 2019-04-14: 09:00:00 12.5 mg via ORAL
  Filled 2019-04-14: qty 1

## 2019-04-14 MED ORDER — PANTOPRAZOLE SODIUM 40 MG PO TBEC: 40.0000 mg | DELAYED_RELEASE_TABLET | Freq: Every day | ORAL | Status: DC

## 2019-04-14 MED ORDER — CLOPIDOGREL BISULFATE 75 MG PO TABS: 75.0000 mg | ORAL_TABLET | Freq: Every day | ORAL | 12 refills | Status: DC

## 2019-04-14 MED FILL — Verapamil HCl IV Soln 2.5 MG/ML: INTRAVENOUS | Qty: 2 | Status: AC

## 2019-04-14 MED FILL — METOPROLOL TARTRATE 25 MG T: 25 | 30 days supply | Qty: 30 | Fill #0

## 2019-04-14 MED FILL — CLOPIDOGREL 75 MG TABLET: 75 | 30 days supply | Qty: 30 | Fill #0

## 2019-04-14 NOTE — TOC Benefit Eligibility Note (Signed)
Transition of Care [facility]) Benefit Eligibility Note    Patient Details  Name: Joshua [name] MRN: [id number] Date of Birth: 03-30-1940   Medication/Dose: JARDIANCE  10 MG DAILY  Covered?: Yes  Tier: 3 Drug  Prescription Coverage [facility]erred Pharmacy: [facility]  Spoke with Person/Company/Phone Number:: [facility]   @  [employer]  [id number] # [phone] OPT- 2  Co-Pay: $ 45.00  Prior Approval: No  Deductible: (NO DEDUCTIBLE WITH PLAN)       Memory Argue Phone Number: 04/14/2019, 12:25 PM

## 2019-04-14 NOTE — Care Management (Signed)
1011 04-14-19 Benefits check submitted for Jardiance. Bethena Roys, RN,BSN Case Manager [phone]

## 2019-04-14 NOTE — Progress Notes (Signed)
CARDIAC REHAB PHASE I   PRE:  Rate/Rhythm: 75 SR    BP: sitting 147/81    SaO2:   MODE:  Ambulation: 430 ft   POST:  Rate/Rhythm: 90 SR    BP: sitting 156/82     SaO2:   Tolerated well, no CP. Has back trouble. Discussed stent, restrictions, Plavix, diet, exercise, NTG and CRPII. Receptive. 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 Will increase his exericise. Would prefer in person CRPII. Will refer to [place]. Understands the importance of Plavix. Pt is interested in participating in Virtual Cardiac and Pulmonary Rehab. Pt advised that Virtual Cardiac and Pulmonary Rehab is provided at no cost to the patient.  Checklist:  1. Pt has smart device  ie smartphone and/or ipad for downloading an app  Yes 2. Reliable internet/wifi service    Yes 3. Understands how to use their smartphone and navigate within an app.  Yes Pt verbalized understanding and is in agreement.  [id number]   [place], ACSM 04/14/2019 10:35 AM

## 2019-04-14 NOTE — Discharge Summary (Addendum)
The patient has been seen in conjunction with Kathyrn Drown, NP. All aspects of care have been considered and discussed. The patient has been personally interviewed, examined, and all clinical data has been reviewed.  The patient is stable this morning after saphenous vein graft PCI yesterday. Slight increase in creatinine noted on this morning's blood work.  He will have repeat basic metabolic panel on Monday of the coming week to ensure no worsening of kidney function from possible contrast-induced nephropathy. Plan follow-up with Dr. Percival Spanish within the next 2 weeks. Groin is stable following the procedure despite having some bruising last p.m. Agree with discharge.  Discharge Summary    Patient ID: Joshua [name] MRN: [id number]; DOB: 06/29/1940  Admit date: 04/13/2019 Discharge date: 04/14/2019  Primary Care Provider: [name], MD  Primary Cardiologist: [name], MD   Discharge Diagnoses    Active Problems:   Hyperlipidemia   CKD (chronic kidney disease), stage III [facility])   Unstable angina [facility])  Diagnostic Studies/Procedures    LHC 04/13/2019:   Prox RCA lesion is 100% stenosed. SVG graft was visualized by angiography. The graft exhibits no disease. Ost Cx to Prox Cx lesion is 100% stenosed. SVG graft was visualized by angiography. SVG graft was visualized by angiography. Prox LAD to Mid LAD lesion is 100% stenosed. LIMA graft was visualized by angiography. The graft exhibits no disease. Origin to Prox Graft lesion is 80% stenosed. A drug-eluting stent was successfully placed using a STENT RESOLUTE ONYX 3.0X18. Post intervention, there is a 0% residual stenosis. The left ventricular systolic function is normal. LV end diastolic pressure is normal. The left ventricular ejection fraction is 55-65% by visual estimate. There is no mitral valve regurgitation.   1. 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 The native LAD is  occluded just beyond the first diagonal branch. The mid and distal LAD fills from the patent LIMA graft 3. The ramus intermediate branches fill from the sequential vein graft which is patent with no disease 4. The Circumflex is occluded at the ostium. The moderate caliber distal OM branch fills from the patent vein graft. The small caliber more proximal OM fills retrograde from collaterals.  5. The native RCA is occluded proximally. The vein graft to the distal RCA is patent 5. Normal LV systolic function   Recommendations: Will monitor overnight. Will plan DAPT with ASA and Plavix for at least 6 months.    History of Present Illness     Joshua [name] is a 79 y.o. male who was seen 04/07/2019 for CAD follow-up found to have increasing chest pain/anginal symptoms therefore plan was for further ischemic evaluation with cardiac catheterization scheduled for 04/13/2019.   Hospital Course     Patient presented in the outpatient setting with Dr. Percival Spanish 04/07/2019 with progressive exertional chest discomfort. This started initially when he would be at the end of the walk. However, the week of office presentation he walked little more vigorously and the pain started at the beginning of the walk. This is new onset exertional chest discomfort, noted to be across his mid chest described as dull to sharp rated a 7 out of 10 in intensity with no radiation or associated shortness of breath.  Patient underwent cardiac catheterization on 04/13/2019 with PCI to proximal graft lesion at 80% stenosis.  LVEF noted to be 55 to 65% per visual estimate on LHC. Plan is for DAPT with ASA and Plavix therapy for at least  6 months duration.  Cath site stable without signs of hematoma.  Creatinine on discharge is 1.40 up from 1.25 on 04/07/2019. Will need close follow-up with lab work to monitor for contrast-induced nephropathy.  New medications include: Plavix 75 mg p.o. daily, metoprolol tartrate 12.5 mg p.o. twice  daily  Other hospital problems include:  HTN: -Elevated, 156/75, 120/67, 152/86 -Continue hydrochlorothiazide 25, losartan 100 -We will add low-dose beta-blocker, metoprolol tartrate 12.5 mg p.o. twice daily -May need additional titration in the outpatient setting   Hyperlipidemia: -LDL, 18 on 11/03/2018 -Continue Repatha therapy   Consultants: None  Patient was seen and examined by Dr. Tamala Julian who feels that he is stable and ready for discharge today, 04/14/2019.  The patient has been seen by cardiac rehabilitation and ambulated without anginal symptoms.  Did the patient have an acute coronary syndrome (MI, NSTEMI, STEMI, etc) this admission?:  No                               Did the patient have a percutaneous coronary intervention (stent / angioplasty)?:  Yes.     Cath/PCI Registry Performance & Quality Measures: Aspirin prescribed? - Yes ADP Receptor Inhibitor (Plavix/Clopidogrel, Brilinta/Ticagrelor or Effient/Prasugrel) prescribed (includes medically managed patients)? - Yes High Intensity Statin (Lipitor 40-80mg  or Crestor 20-40mg ) prescribed? - No - Patient on Repatha, intolerant to statins For EF <40%, was ACEI/ARB prescribed? - Not Applicable (EF >/= [id number]) For EF <40%, Aldosterone Antagonist (Spironolactone or Eplerenone) prescribed? - Not Applicable (EF >/= [id number]) Cardiac Rehab Phase II ordered (Included Medically managed Patients)? - Yes   _____________  Discharge Vitals Blood pressure (!) 156/75, pulse 80, temperature 97.9 F (36.6 C), temperature source Oral, resp. rate 19, height 5' 6.5" (1.689 m), weight 96.8 kg, SpO2 96 %.  Filed Weights   04/13/19 0554 04/14/19 0100 04/14/19 0210  Weight: 96.2 kg 96.8 kg 96.8 kg    Labs & Radiologic Studies    CBC Recent Labs    04/14/19 0412  WBC 6.4  HGB 13.9  HCT 39.5  MCV 91.2  PLT [id number]   Basic Metabolic Panel Recent Labs    04/14/19 0412  NA 139  K 3.6  CL 102  CO2 27  GLUCOSE 120*  BUN 13  CREATININE  1.40*  CALCIUM 9.1  _____________  MR BRAIN WO CONTRAST  Result Date: 03/28/2019 CLINICAL DATA:  Frontal headaches, worsening over the last year. History of prostate bladder cancer. EXAM: MRI HEAD WITHOUT CONTRAST TECHNIQUE: Multiplanar, multiecho pulse sequences of the brain and surrounding structures were obtained without intravenous contrast. COMPARISON:  04/05/2017 FINDINGS: Brain: Diffusion imaging does not show any acute or subacute infarction. No focal abnormality affects the brainstem or cerebellum. 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 Findings are minimally progressive since January of 2019. No mass lesion, hemorrhage, hydrocephalus or extra-axial collection. Vascular: Major vessels at the base of the brain show flow. Skull and upper cervical spine: Negative Sinuses/Orbits: Clear/normal Other: None IMPRESSION: No acute or subacute finding. No specific cause of headaches identified. Chronic small-vessel ischemic changes throughout the brain, minimally progressive since the most recent study of January 2019. Electronically Signed   By: Nelson Chimes M.D.   On: 03/28/2019 14:46   CARDIAC CATHETERIZATION  Result Date: 04/13/2019  Prox RCA lesion is 100% stenosed.  SVG graft was visualized by angiography.  The graft exhibits no disease.  Ost Cx to Prox Cx lesion is 100% stenosed.  SVG graft was visualized by angiography.  SVG graft was visualized by angiography.  Prox LAD to Mid LAD lesion is 100% stenosed.  LIMA graft was visualized by angiography.  The graft exhibits no disease.  Origin to Prox Graft lesion is 80% stenosed.  A drug-eluting stent was successfully placed using a STENT RESOLUTE ONYX 3.0X18.  Post intervention, there is a 0% residual stenosis.  The left ventricular systolic function is normal.  LV  end diastolic pressure is normal.  The left ventricular ejection fraction is 55-65% by visual estimate.  There is no mitral valve regurgitation.  1. 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 The native LAD is occluded just beyond the first diagonal branch. The mid and distal LAD fills from the patent LIMA graft 3. The ramus intermediate branches fill from the sequential vein graft which is patent with no disease 4. The Circumflex is occluded at the ostium. The moderate caliber distal OM branch fills from the patent vein graft. The small caliber more proximal OM fills retrograde from collaterals. 5. The native RCA is occluded proximally. The vein graft to the distal RCA is patent 5. Normal LV systolic function Recommendations: Will monitor overnight. Will plan DAPT with ASA and Plavix for at least 6 months.   Disposition   Pt is being discharged home today in good condition.  Follow-up Plans & Appointments    Follow-up Information     [name], MD Follow up on 04/21/2019.   Specialty: Cardiology Why: Your follow-up appointment will be 04/21/2019 at 1140 with Dr. Percival Spanish.  Please allow time to come early for lab work prior to your appointment. Contact information: [address] [place] [address] [phone]           Discharge Instructions     Amb Referral to Cardiac Rehabilitation   Complete by: As directed    Diagnosis:  Coronary Stents PTCA     After initial evaluation and assessments completed: Virtual Based Care may be provided alone or in conjunction with Phase 2 Cardiac Rehab based on patient barriers.: Yes   Call MD for:  difficulty breathing, headache or visual disturbances   Complete by: As directed    Call MD for:  extreme fatigue   Complete by: As directed    Call MD for:  hives   Complete by: As directed    Call MD for:  persistant dizziness or light-headedness   Complete by: As directed    Call MD for:   persistant nausea and vomiting   Complete by: As directed    Call MD for:  redness, tenderness, or signs of infection (pain, swelling, redness, odor or green/yellow discharge around incision site)   Complete by: As directed    Call MD for:  severe uncontrolled pain   Complete by: As directed    Call MD for:  temperature >100.4   Complete by: As directed    Diet - low sodium heart healthy   Complete by: As directed    Discharge instructions   Complete by: As directed    No driving for 5 days. No lifting over 5 lbs for 1 week. No sexual activity for 1 week. Keep procedure site clean & dry. If you notice increased pain, swelling, bleeding or pus, call/return!  You may shower, but no soaking baths/hot  tubs/pools for 1 week.    PLEASE DO NOT MISS ANY DOSES OF YOUR PLAVIX!!!!! Also keep a log of you blood pressures and bring back to your follow up appt. Please call the office with any questions.   Patients taking blood thinners should generally stay away from medicines like ibuprofen, Advil, Motrin, naproxen, and Aleve due to risk of stomach bleeding. You may take Tylenol as directed or talk to your primary doctor about alternatives.  Some studies suggest Prilosec/Omeprazole interacts with Plavix. We changed your Prilosec/Omeprazole to the equivalent dose of Protonix for less chance of interaction.    Please comment early to your appointment on 04/21/2019 with Dr. Percival Spanish to have lab work obtained prior to your appointment.   Increase activity slowly   Complete by: As directed       Discharge Medications   Allergies as of 04/14/2019       Reactions   Benadryl [diphenhydramine] Other (See Comments)   Pt told not to take because of bypass surgery   Bromfed Nausea And Vomiting   Clarithromycin Other (See Comments)    gastritis   Codeine Other (See Comments)   Trouble breathing   Doxycycline Hyclate Nausea And Vomiting   Modafinil Other (See Comments)    anxiety-nervousness    Oxycodone-acetaminophen Itching   Promethazine Hcl Other (See Comments)    fainting   Quinolones Nausea Only   Cipro "felt real bad"   Telithromycin Nausea And Vomiting   Hydrocodone-acetaminophen Rash   Statins Other (See Comments)   Per pt tried multiple in years past   Sulfonamide Derivatives Rash        Medication List     STOP taking these medications    omeprazole 20 MG capsule Commonly known as: PRILOSEC Replaced by: pantoprazole 40 MG tablet       TAKE these medications    acetaminophen 500 MG tablet Commonly known as: TYLENOL Take 1,000 mg by mouth every 6 (six) hours as needed for moderate pain or headache.   albuterol 108 (90 Base) MCG/ACT inhaler Commonly known as: ProAir HFA Inhale 1-2 puffs into the lungs every 6 (six) hours as needed for wheezing or shortness of breath.   ALIGN PO Take 7.5 g by mouth daily.   aspirin EC 81 MG tablet Take 81 mg by mouth at bedtime.   clopidogrel 75 MG tablet Commonly known as: PLAVIX Take 1 tablet (75 mg total) by mouth daily with breakfast. Start taking on: April 15, 2019   diazepam 5 MG tablet Commonly known as: VALIUM Take 1 tablet (5 mg total) by mouth at bedtime as needed. What changed: reasons to take this   Flovent HFA 110 MCG/ACT inhaler Generic drug: fluticasone Inhale 1 puff into the lungs 2 (two) times daily.   fluticasone 50 MCG/ACT nasal spray Commonly known as: FLONASE Place 1 spray into both nostrils 2 (two) times daily.   folic acid [id number] MCG tablet Commonly known as: FOLVITE Take 800 mcg by mouth daily.   gabapentin 300 MG capsule Commonly known as: NEURONTIN Take 1 cap in AM, 1 cap at noon, 2 caps at bedtime What changed:  how much to take how to take this when to take this additional instructions   hydrochlorothiazide 25 MG tablet Commonly known as: HYDRODIURIL TAKE 1 TABLET BY MOUTH EVERY DAY What changed: when to take this   ketoconazole 2 % cream Commonly known as:  NIZORAL Apply 1 application topically 2 (two) times daily as needed for irritation.  loratadine 10 MG tablet Commonly known as: CLARITIN Take 10 mg by mouth at bedtime.   losartan 100 MG tablet Commonly known as: COZAAR TAKE 1 TABLET BY MOUTH EVERY DAY What changed: when to take this   Lotrimin AF Deodorant Powder 2 % Aerp Generic drug: Miconazole Nitrate Apply 1 spray topically 2 (two) times daily.   meclizine 25 MG tablet Commonly known as: ANTIVERT Take 1 tablet (25 mg total) by mouth 3 (three) times daily as needed for dizziness.   metoprolol tartrate 25 MG tablet Commonly known as: LOPRESSOR Take 0.5 tablets (12.5 mg total) by mouth 2 (two) times daily.   MOVE FREE PO Take 1 tablet by mouth daily.   ondansetron 8 MG disintegrating tablet Commonly known as: Zofran ODT Take 1 tablet (8 mg total) by mouth every 8 (eight) hours as needed for nausea or vomiting.   pantoprazole 40 MG tablet Commonly known as: PROTONIX Take 1 tablet (40 mg total) by mouth daily. Start taking on: April 15, 2019 Replaces: omeprazole 20 MG capsule   polyethylene glycol powder 17 GM/SCOOP powder Commonly known as: GLYCOLAX/MIRALAX Take 17 g by mouth daily. What changed: when to take this   pyridOXINE 100 MG tablet Commonly known as: VITAMIN B-6 Take 100 mg by mouth daily with supper.   Repatha SureClick [id number] MG/ML Soaj Generic drug: Evolocumab Inject 140 mg into the skin every 14 (fourteen) days.   traMADol 50 MG tablet Commonly known as: ULTRAM Take 1 tablet (50 mg total) by mouth every 6 (six) hours as needed.   vitamin B-12 1000 MCG tablet Commonly known as: CYANOCOBALAMIN Take 1,000 mcg by mouth daily.   Vitamin D3 50 MCG (2000 UT) Tabs Take 2,000 Units by mouth daily.          Outstanding Labs/Studies   BMET   Duration of Discharge Encounter   Greater than 30 minutes including physician time.  Signed, Kathyrn Drown, NP 04/14/2019, 11:21 AM

## 2019-04-14 NOTE — Progress Notes (Signed)
Progress Note  Patient Name: Joshua [name] Date of Encounter: 04/14/2019  Primary Cardiologist: Dr. Percival Spanish, MD  Subjective   Doing well today, no recurrent anginal symptoms.  Cath site stable.  Anticipate home.  Inpatient Medications    Scheduled Meds: . aspirin EC  81 mg Oral QHS  . clopidogrel  75 mg Oral Q breakfast  . fluticasone  1 spray Each Nare BID  . gabapentin  300 mg Oral BID  . gabapentin  600 mg Oral QHS  . hydrochlorothiazide  25 mg Oral Q lunch  . loratadine  10 mg Oral QHS  . losartan  100 mg Oral QHS  . pantoprazole  40 mg Oral Daily  . sodium chloride flush  3 mL Intravenous Q12H   Continuous Infusions: . sodium chloride     PRN Meds: sodium chloride, albuterol, ondansetron (ZOFRAN) IV, sodium chloride flush   Vital Signs    Vitals:   04/13/19 2219 04/14/19 0100 04/14/19 0210 04/14/19 0218  BP: 120/67  (!) 156/75   Pulse: 84  80   Resp: 14 13 19    Temp:    97.9 F (36.6 C)  TempSrc:    Oral  SpO2: 96%     Weight:  96.8 kg 96.8 kg   Height:        Intake/Output Summary (Last 24 hours) at 04/14/2019 [id number] Last data filed at 04/14/2019 0617 Gross per 24 hour  Intake 118 ml  Output 500 ml  Net -382 ml   Filed Weights   04/13/19 0554 04/14/19 0100 04/14/19 0210  Weight: 96.2 kg 96.8 kg 96.8 kg    Physical Exam   General: Well developed, well nourished, NAD Neck: Negative for carotid bruits. No JVD Lungs:Clear to ausculation bilaterally. No wheezes, rales, or rhonchi. Breathing is unlabored. Cardiovascular: RRR with S1 S2. No murmurs, rubs, gallops, or LV heave appreciated. Abdomen: Soft, non-tender, non-distended. No obvious abdominal masses. Extremities: No edema. DP pulses 2+ bilaterally Neuro: Alert and oriented. No focal deficits. No facial asymmetry. MAE spontaneously. Psych: Responds to questions appropriately with normal affect.    Labs    Chemistry Recent Labs  Lab 04/07/19 0830 04/14/19 0412  NA 138 139  K  4.1 3.6  CL 99 102  CO2 27 27  GLUCOSE 74 120*  BUN 15 13  CREATININE 1.25 1.40*  CALCIUM 9.8 9.1  GFRNONAA 56* 48*  GFRAA 64 56*  ANIONGAP  --  10     Hematology Recent Labs  Lab 04/07/19 0830 04/14/19 0412  WBC 6.1 6.4  RBC 4.57 4.33  HGB 14.9 13.9  HCT 42.9 39.5  MCV 94 91.2  MCH 32.6 32.1  MCHC 34.7 35.2  RDW 12.0 11.8  PLT 195 183    Cardiac EnzymesNo results for input(s): TROPONINI in the last 168 hours. No results for input(s): TROPIPOC in the last 168 hours.   BNPNo results for input(s): BNP, PROBNP in the last 168 hours.   DDimer No results for input(s): DDIMER in the last 168 hours.   Radiology    CARDIAC CATHETERIZATION  Result Date: 04/13/2019  Prox RCA lesion is 100% stenosed.  SVG graft was visualized by angiography.  The graft exhibits no disease.  Ost Cx to Prox Cx lesion is 100% stenosed.  SVG graft was visualized by angiography.  SVG graft was visualized by angiography.  Prox LAD to Mid LAD lesion is 100% stenosed.  LIMA graft was visualized by angiography.  The graft exhibits no disease.  Origin to Prox Graft lesion is 80% stenosed.  A drug-eluting stent was successfully placed using a STENT RESOLUTE ONYX 3.0X18.  Post intervention, there is a 0% residual stenosis.  The left ventricular systolic function is normal.  LV end diastolic pressure is normal.  The left ventricular ejection fraction is 55-65% by visual estimate.  There is no mitral valve regurgitation.  1. 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 The native LAD is occluded just beyond the first diagonal branch. The mid and distal LAD fills from the patent LIMA graft 3. The ramus intermediate branches fill from the sequential vein graft which is patent with no disease 4. The Circumflex is occluded at the ostium. The moderate caliber distal OM branch fills from the patent vein graft. The small caliber more proximal OM fills retrograde from collaterals. 5. The  native RCA is occluded proximally. The vein graft to the distal RCA is patent 5. Normal LV systolic function Recommendations: Will monitor overnight. Will plan DAPT with ASA and Plavix for at least 6 months.   Telemetry    04/14/2019: NSR, HR 70s 80s- Personally Reviewed  ECG    No new tracing as of 04/14/2019- Personally Reviewed  Cardiac Studies   LHC 04/13/2019:   Prox RCA lesion is 100% stenosed.  SVG graft was visualized by angiography.  The graft exhibits no disease.  Ost Cx to Prox Cx lesion is 100% stenosed.  SVG graft was visualized by angiography.  SVG graft was visualized by angiography.  Prox LAD to Mid LAD lesion is 100% stenosed.  LIMA graft was visualized by angiography.  The graft exhibits no disease.  Origin to Prox Graft lesion is 80% stenosed.  A drug-eluting stent was successfully placed using a STENT RESOLUTE ONYX 3.0X18.  Post intervention, there is a 0% residual stenosis.  The left ventricular systolic function is normal.  LV end diastolic pressure is normal.  The left ventricular ejection fraction is 55-65% by visual estimate.  There is no mitral valve regurgitation.   1. 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 The native LAD is occluded just beyond the first diagonal branch. The mid and distal LAD fills from the patent LIMA graft 3. The ramus intermediate branches fill from the sequential vein graft which is patent with no disease 4. The Circumflex is occluded at the ostium. The moderate caliber distal OM branch fills from the patent vein graft. The small caliber more proximal OM fills retrograde from collaterals.  5. The native RCA is occluded proximally. The vein graft to the distal RCA is patent 5. Normal LV systolic function  Recommendations: Will monitor overnight. Will plan DAPT with ASA and Plavix for at least 6 months.   Patient Profile     79 y.o. male who was seen in the office by Dr. Percival Spanish  04/07/2019 for CAD follow-up presents for follow up of CAD found to have increasing chest pain/anginal symptoms therefore plan was for further ischemic evaluation with cardiac catheterization scheduled for 04/13/2019  Assessment & Plan    1.  CAD s/p CABG>> PCI/DES to proximal graft lesion 04/13/2019: -Patient presented for OV 04/07/2019 with progressive anginal symptoms with plans for outpatient cardiac catheterization for further ischemic evaluation which showed severe triple-vessel CAD status post 5 vessel CABG with 5/5 patent bypass grafts.  Was however in origin to the proximal graft lesion at 80% stenosis at which time a PCI/DES was placed to this region.  LVEF noted to be 55 to 65% with recommendations for DAPT with ASA and Plavix therapy for at least 6 months duration -Cath site stable without signs of hematoma -Denies recurrent anginal symptoms -Continue ASA, Plavix, -Statin intolerant, continue Repatha -We will start low-dose metoprolol tartrate 12.5 mg p.o. twice daily given elevated pressures -Does not appear to be on beta-blocker therapy prior to hospital admission  2.  HTN: -Elevated, 156/75, 120/67, 152/86 -Continue hydrochlorothiazide 25, losartan 100 -We will add low-dose beta-blocker, metoprolol tartrate 12.5 mg p.o. twice daily -May need additional titration in the outpatient setting  3.  Hyperlipidemia: -LDL, 18 on 11/03/2018 -Continue Repatha therapy   Signed, Kathyrn Drown NP-C [facility] Pager: [phone] 04/14/2019, 7:22 AM     For questions or updates, please contact   Please consult [email or web address] for contact info under Cardiology/STEMI.

## 2019-04-17 ENCOUNTER — Other Ambulatory Visit: Payer: Self-pay

## 2019-04-17 ENCOUNTER — Telehealth: Payer: Self-pay

## 2019-04-17 ENCOUNTER — Ambulatory Visit: Payer: PPO | Admitting: Physician Assistant

## 2019-04-17 ENCOUNTER — Encounter: Payer: Self-pay | Admitting: Physician Assistant

## 2019-04-17 ENCOUNTER — Telehealth: Payer: Self-pay | Admitting: Cardiology

## 2019-04-17 VITALS — BP 119/71 | HR 71 | Temp 97.3°F | Ht 66.5 in | Wt 214.2 lb

## 2019-04-17 DIAGNOSIS — R5383 Other fatigue: Secondary | ICD-10-CM

## 2019-04-17 DIAGNOSIS — I25810 Atherosclerosis of coronary artery bypass graft(s) without angina pectoris: Secondary | ICD-10-CM | POA: Diagnosis not present

## 2019-04-17 DIAGNOSIS — I2510 Atherosclerotic heart disease of native coronary artery without angina pectoris: Secondary | ICD-10-CM | POA: Diagnosis not present

## 2019-04-17 DIAGNOSIS — R1031 Right lower quadrant pain: Secondary | ICD-10-CM | POA: Diagnosis not present

## 2019-04-17 DIAGNOSIS — N179 Acute kidney failure, unspecified: Secondary | ICD-10-CM | POA: Diagnosis not present

## 2019-04-17 DIAGNOSIS — I10 Essential (primary) hypertension: Secondary | ICD-10-CM | POA: Diagnosis not present

## 2019-04-17 DIAGNOSIS — E785 Hyperlipidemia, unspecified: Secondary | ICD-10-CM

## 2019-04-17 NOTE — Telephone Encounter (Signed)
Spoke with pt and his wife regarding c/o SOB, fatigue. Pt was cath'd on 1/21 and discharged on 1/22. He states the last 2 days he has had increased fatigue with associated SOB. He denies CP. He states he has groin pain with bruising from his naval to mid thigh. He is unable to tell if the sight is soft vs hard. He also has c/o of feeling "cold" in the last couple of days. He has chronic arthritic back pain, however he has been experiencing increased back pain (L>R). His BP's have been WNL.  With some concern over his groin site and associated symptoms, pt has agreed to see Almyra Deforest, PA for evaluation today. Pts appt with Dr. Percival Spanish has been maintained in case pt needs follow up later this week.[name]

## 2019-04-17 NOTE — Telephone Encounter (Signed)
Patient had a stent put in on Thursday. His wife was reading the discharge papers and states that he was to contact the office he feels real fatigued. Patient states that he has recently been really exhausted after walking for about 4-5 mins. This morning he walked around the dining room, living room and kitchen a couple of times and got really really tired. I asked if he was SOB, he states that he doesn't feel like that is what the issue is as much as it that he's just tired. He has an appt on Friday 04/21/19 with Dr. Percival Spanish.

## 2019-04-17 NOTE — Progress Notes (Signed)
Cardiology Office Note  Date: 04/19/2019   ID: Joshua [name], Joshua [name] [birth year], MRN [id number]  PCP:  [name], MD  Cardiologist:  [name], MD Electrophysiologist:  None   Chief Complaint  Patient presents with  . Follow-up    Status post cardiac catheterization    History of Present Illness: Joshua [name] is a 79 y.o. male recently seen by Dr. Percival Spanish for chest pain.  History of CAD.  Previous negative perfusion study in 2015.  He had chest pain in January 2020 and a routine treadmill stress test showing no ischemia.  He had been experiencing chest discomfort with activity.  He described previously having chest discomfort at the end of his walks.  More recently the pain began at the beginning of walking.  He described it as dull to sharp , moderate to nearly severe in intensity.  He denied any radiation associated with the pain but stated it usually took a couple of hours to dissipate.  He describes similar symptoms when he had coronary disease previously.  He agreed to cardiac catheterization.  Cardiac catheterization revealed severe triple-vessel coronary artery disease status post 5 vessel bypass with 5 out of 5 patent bypass grafts.  The native LAD was occluded beyond the first diagonal branch.  Patient LIMA graft. The circumflex is occluded at the ostium.  The moderate caliber distal OM branch fills from the patent vein graft.  The small caliber more proximal OM fills retrograde from collaterals.  The native RCA is occluded proximally.  The vein graft to the distal RCA is patent. A DES was successfully placed to origin to proximal graft lesion saphenous graft to third marginal.  Patient had normal left ventricular systolic function.  He was discharged on aspirin and Plavix.  Patient presents today for cardiology office visit.  He complains of groin pain, worsening fatigue and shortness of breath after discharge.  He denies any chest discomfort since the stent  placement.  I suspect worsening fatigue may be related to the new metoprolol 12.5 mg twice daily dosing, I will stop his metoprolol at this time.  On physical exam, he has a large area of ecchymosis around his right groin.  There is a large bulge suggestive of at least 2 cm x 3 cm large hematoma.  I did not feel any pulsatile mass nor did I hear any bruit on physical exam, however I feel it is prudent for [place] to order a arterial Doppler on the side to rule out pseudoaneurysm.  Tenderness in the area increased to 6 out of 10 with palpation.  Otherwise we emphasized on the importance of compliance with dual antiplatelet therapy.  He says his fatigue and shortness of breath has been worsening since discharge, on physical exam, I do not see any sign of volume overload.  I recommended a basic metabolic panel and CBC to rule out secondary causes such as anemia and worsening renal disease.  He has a follow-up with Dr. Percival Spanish this Friday, I will keep this follow-up for the time being in case his symptom worsens or if arterial Doppler came back positive.  Medications reviewed today.  Currently taking aspirin 81 mg a day, Plavix daily, Repatha 140 mg subcu q. 14 days, HCTZ 25 mg daily, losartan 100 mg daily, metoprolol 12.5 mg p.o. twice daily.  Past Medical History:  Diagnosis Date  . Acute medial meniscal tear   . Anemia   . Arthritis    "middle finger right hand; right  knee; neck" (02/06/2014)  . Asthma   . Bladder cancer ([place]) 04/2010   "cauterized during prostate OR"  . CAD (coronary artery disease)    CABG 2001  . Depression   . Diverticulosis   . DJD (degenerative joint disease)    BACK  . GERD (gastroesophageal reflux disease)   . H/O inguinal hernia repair 12/2018  . History of gout   . Hyperlipidemia   . Hypertension   . IBS (irritable bowel syndrome)   . Obesity   . OSA (obstructive sleep apnea)    USES CPAP   . Pancreatitis ~ 1980  . Prostate cancer ([place]) 04/2010    Past Surgical  History:  Procedure Laterality Date  . ANTERIOR CERVICAL DECOMP/DISCECTOMY FUSION  05/26/2006   PLATE PLACED  . CARDIAC CATHETERIZATION  11/20/1999  . CORONARY ARTERY BYPASS GRAFT  11/20/1999   SVG-RI1-RI2, SVG-OM, SVG-dRCA  . CORONARY STENT INTERVENTION N/A 04/13/2019   Procedure: CORONARY STENT INTERVENTION;  Surgeon: Burnell Blanks, MD;  Location: [facility];  Service: Cardiovascular;  Laterality: N/A;  . INGUINAL HERNIA REPAIR Left 01/02/2019   Procedure: OPEN LEFT INGUINAL HERNIA REPAIR WITH MESH;  Surgeon: Johnathan Hausen, MD;  Location: [facility];  Service: General;  Laterality: Left;  . KNEE ARTHROSCOPY Right 1992; 04/2008  . LAPAROSCOPIC CHOLECYSTECTOMY  2007  . LEFT HEART CATH AND CORS/GRAFTS ANGIOGRAPHY N/A 04/13/2019   Procedure: LEFT HEART CATH AND CORS/GRAFTS ANGIOGRAPHY;  Surgeon: Burnell Blanks, MD;  Location: [facility];  Service: Cardiovascular;  Laterality: N/A;  . NASAL SEPTUM SURGERY Right 2007  . ROBOT ASSISTED LAPAROSCOPIC RADICAL PROSTATECTOMY  04/2010  . THYROIDECTOMY, PARTIAL  1980    Current Outpatient Medications  Medication Sig Dispense Refill  . acetaminophen (TYLENOL) 500 MG tablet Take 1,000 mg by mouth every 6 (six) hours as needed for moderate pain or headache.     . albuterol (PROAIR HFA) 108 (90 BASE) MCG/ACT inhaler Inhale 1-2 puffs into the lungs every 6 (six) hours as needed for wheezing or shortness of breath. 18 g 3  . aspirin EC 81 MG tablet Take 81 mg by mouth at bedtime.     . Cholecalciferol (VITAMIN D3) 50 MCG (2000 UT) TABS Take 2,000 Units by mouth daily.    . clopidogrel (PLAVIX) 75 MG tablet Take 1 tablet (75 mg total) by mouth daily with breakfast. 30 tablet 12  . diazepam (VALIUM) 5 MG tablet Take 1 tablet (5 mg total) by mouth at bedtime as needed. (Patient taking differently: Take 5 mg by mouth at bedtime as needed (sleep). ) 30 tablet 1  . Evolocumab (REPATHA SURECLICK) [id number] MG/ML SOAJ Inject 140 mg into the  skin every 14 (fourteen) days. 6 pen 3  . FLOVENT HFA 110 MCG/ACT inhaler Inhale 1 puff into the lungs 2 (two) times daily.   5  . fluticasone (FLONASE) 50 MCG/ACT nasal spray Place 1 spray into both nostrils 2 (two) times daily.     . folic acid (FOLVITE) [id number] MCG tablet Take 800 mcg by mouth daily.     [name] gabapentin (NEURONTIN) 300 MG capsule Take 1 cap in AM, 1 cap at noon, 2 caps at bedtime (Patient taking differently: Take 300-600 mg by mouth See admin instructions. Take 1 capsule (300 mg) by mouth in the morning, take 1 capsule (300 mg) by mouth with lunch, & take 2 capsules (600 mg) by mouth at bedtime.) 360 capsule 3  . Glucosamine-Chondroitin (MOVE FREE PO) Take 1 tablet by  mouth daily.    . hydrochlorothiazide (HYDRODIURIL) 25 MG tablet TAKE 1 TABLET BY MOUTH EVERY DAY (Patient taking differently: Take 25 mg by mouth daily with lunch. ) 90 tablet 0  . ketoconazole (NIZORAL) 2 % cream Apply 1 application topically 2 (two) times daily as needed for irritation.     [name] loratadine (CLARITIN) 10 MG tablet Take 10 mg by mouth at bedtime.     [name] losartan (COZAAR) 100 MG tablet TAKE 1 TABLET BY MOUTH EVERY DAY (Patient taking differently: Take 100 mg by mouth at bedtime. ) 90 tablet 3  . meclizine (ANTIVERT) 25 MG tablet Take 1 tablet (25 mg total) by mouth 3 (three) times daily as needed for dizziness. 30 tablet 0  . Miconazole Nitrate (LOTRIMIN AF DEODORANT POWDER) 2 % AERP Apply 1 spray topically 2 (two) times daily.    . ondansetron (ZOFRAN ODT) 8 MG disintegrating tablet Take 1 tablet (8 mg total) by mouth every 8 (eight) hours as needed for nausea or vomiting. 20 tablet 0  . pantoprazole (PROTONIX) 40 MG tablet Take 1 tablet (40 mg total) by mouth daily.    . pantoprazole (PROTONIX) 40 MG tablet Take 1 tablet (40 mg total) by mouth daily. 30 tablet 1  . polyethylene glycol powder (GLYCOLAX/MIRALAX) powder Take 17 g by mouth daily. (Patient taking differently: Take 17 g by mouth 2 (two) times daily.  ) 255 g 0  . Probiotic Product (ALIGN PO) Take 7.5 g by mouth daily.    [name] pyridOXINE (VITAMIN B-6) 100 MG tablet Take 100 mg by mouth daily with supper.    . traMADol (ULTRAM) 50 MG tablet Take 1 tablet (50 mg total) by mouth every 6 (six) hours as needed. 20 tablet 1  . vitamin B-12 (CYANOCOBALAMIN) 1000 MCG tablet Take 1,000 mcg by mouth daily.     No current facility-administered medications for this visit.   Allergies:  Benadryl [diphenhydramine], Bromfed, Clarithromycin, Codeine, Doxycycline hyclate, Modafinil, Oxycodone-acetaminophen, Promethazine hcl, Quinolones, Telithromycin, Hydrocodone-acetaminophen, Statins, and Sulfonamide derivatives   Social History: The patient  reports that he quit smoking about 26 years ago. His smoking use included cigarettes. He has a 35.00 pack-year smoking history. He has never used smokeless tobacco. He reports current alcohol use of about 7.0 standard drinks of alcohol per week. He reports that he does not use drugs.   Family History: The patient's family history includes Diabetes in his brother; Heart disease in his father and mother; Hyperlipidemia in his father and mother.   ROS:  Please see the history of present illness. Otherwise, complete review of systems is positive for none.  All other systems are reviewed and negative.   Physical Exam: VS:  BP 119/71   Pulse 71   Temp (!) 97.3 F (36.3 C)   Ht 5' 6.5" (1.689 m)   Wt 214 lb 3.2 oz (97.2 kg)   SpO2 95%   BMI 34.05 kg/m , BMI Body mass index is 34.05 kg/m.  Wt Readings from Last 3 Encounters:  04/17/19 214 lb 3.2 oz (97.2 kg)  04/14/19 213 lb 4.8 oz (96.8 kg)  04/07/19 202 lb (91.6 kg)    General: Patient appears comfortable at rest. HEENT: Conjunctiva and lids normal, oropharynx clear with moist mucosa. Neck: Supple, no elevated JVP or carotid bruits, no thyromegaly. Lungs: Clear to auscultation, nonlabored breathing at rest. Cardiac: Regular rate and rhythm, no S3 or significant  systolic murmur, no pericardial rub. Abdomen: Soft, nontender, no hepatomegaly, bowel sounds present, no  guarding or rebound. Extremities: No pitting edema, distal pulses 2+. Skin: Warm and dry. Musculoskeletal: No kyphosis. Neuropsychiatric: Alert and oriented x3, affect grossly appropriate.  ECG:  An ECG dated April 17, 2019 was personally reviewed today and demonstrated:  Sinus rhythm rate of 71 no significant ST-T wave changes.  Recent Labwork: 10/27/2018: TSH 1.31 11/03/2018: ALT 22; AST 14 04/17/2019: BUN 16; Creatinine, Ser 1.39; Hemoglobin 14.0; Platelets 247; Potassium 4.3; Sodium 139     Component Value Date/Time   CHOL 96 11/03/2018 0806   CHOL 81 (L) 12/30/2016 0848   TRIG 158.0 (H) 11/03/2018 0806   HDL 46.20 11/03/2018 0806   HDL 42 12/30/2016 0848   CHOLHDL 2 11/03/2018 0806   VLDL 31.6 11/03/2018 0806   LDLCALC 18 11/03/2018 0806   LDLCALC 16 10/01/2017 1703   LDLDIRECT 115.0 06/04/2016 0919    Other Studies Reviewed Today:  Cardiac catheterization April 13, 2019  Prox RCA lesion is 100% stenosed.  SVG graft was visualized by angiography.  The graft exhibits no disease.  Ost Cx to Prox Cx lesion is 100% stenosed.  SVG graft was visualized by angiography.  SVG graft was visualized by angiography.  Prox LAD to Mid LAD lesion is 100% stenosed.  LIMA graft was visualized by angiography.  The graft exhibits no disease.  Origin to Prox Graft lesion is 80% stenosed.  A drug-eluting stent was successfully placed using a STENT RESOLUTE ONYX 3.0X18.  Post intervention, there is a 0% residual stenosis.  The left ventricular systolic function is normal.  LV end diastolic pressure is normal.  The left ventricular ejection fraction is 55-65% by visual estimate.  There is no mitral valve regurgitation.   1. 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 The native LAD is occluded just beyond the first diagonal branch. The mid and  distal LAD fills from the patent LIMA graft 3. The ramus intermediate branches fill from the sequential vein graft which is patent with no disease 4. The Circumflex is occluded at the ostium. The moderate caliber distal OM branch fills from the patent vein graft. The small caliber more proximal OM fills retrograde from collaterals.  5. The native RCA is occluded proximally. The vein graft to the distal RCA is patent 5. Normal LV systolic function  Recommendations: Will monitor overnight. Will plan DAPT with ASA and Plavix for at least 6 months.    Assessment and Plan:  1.  Right groin pain: Patient has significant ecchymosis over the right groin catheter site.  There appears to be a large hematoma, there was no pulsatile mass or bruit on physical exam.  However given the significant tenderness around the area, we recommended to obtain an arterial Doppler to rule out a pseudoaneurysm.  2. Fatigue: I suspect this is related to beta-blocker therapy, will discontinue low-dose metoprolol.  Obtain basic metabolic panel and CBC  3. Coronary artery disease involving native coronary artery of native heart without angina pectoris  - Recent DES to SVG to OM.  Continue aspirin and Plavix.  He will require a minimum of 48-month of dual antiplatelet therapy.  4. Hypertension: Blood pressure stable on current therapy Hyperlipidemia: Continue high intensity statin therapy AKI: Obtain basic metabolic panel  5. Essential hypertension  6. Hyperlipidemia LDL goal <70    Medication Adjustments/Labs and Tests Ordered: Current medicines are reviewed at length with the patient today.  Concerns regarding medicines are outlined above.    Patient Instructions  Medication Instructions:  STOP METOPROLOL TARTRATE (LOPRESSOR)  *If you need a refill on your cardiac medications before your next appointment, please call your pharmacy*  Lab Work: Your physician recommends that you return for lab work TODAY:   BMET  CBC  If you have labs (blood work) drawn today and your tests are completely normal, you will receive your results only by: [name] MyChart Message (if you have MyChart) OR . A paper copy in the mail If you have any lab test that is abnormal or we need to change your treatment, we will call you to review the results.  Testing/Procedures: Your physician has requested that you have a lower extremity arterial duplex. This test is an ultrasound of the arteries in the legs. It looks at arterial blood flow in the legs. Allow one hour for Lower and Arterial scans. There are no restrictions or special instructions   PLEASE SCHEDULE FOR TOMORROW OR Wednesday  Follow-Up: At [facility], you and your health needs are our priority.  As part of our continuing mission to provide you with exceptional heart care, we have created designated Provider Care Teams.  These Care Teams include your primary Cardiologist (physician) and Advanced Practice Providers (APPs -  Physician Assistants and Nurse Practitioners) who all work together to provide you with the care you need, when you need it.  Your next appointment:   KEEP APPOINTMENT AS SCHEDULED   The format for your next appointment:   In Person  Provider:   [name], MD  Other Instructions          Signed, Levell July, NP 04/19/2019 11:41 PM    [facility] [place], [place], [place] [address] Phone: [phone]; Fax: [phone]

## 2019-04-17 NOTE — Telephone Encounter (Signed)
   Primary Cardiologist: [name], MD  Chart reviewed as part of pre-operative protocol coverage. Patient was contacted 04/17/2019 in reference to request to hold Plavix for spine injection.   Joshua [name] has just had placement of a cardiac drug eluting stent to one of his bypass grafts on 04/13/2019 with recommendation for dual antiplatelet therapy for at least 6 months. Interrupting Plavix at this time for an elective procedure would cause risk of stent thrombosis and is not advised.   I spoke to the patient and he agrees that he can wait until 6 months post stent placement. Please revisit spinal injection after 10/11/2019 and check with our office at that time for clearance to hold Plavix.   Daune Perch, NP 04/17/2019, 1:52 PM

## 2019-04-17 NOTE — Telephone Encounter (Signed)
   [facility] Pre-operative Risk Assessment    Request for surgical clearance:  1. What type of surgery is being performed? Lumbar ESI   2. When is this surgery scheduled? TBD   3. What type of clearance is required (medical clearance vs. Pharmacy clearance to hold med vs. Both)? Pharmacy  4. Are there any medications that need to be held prior to surgery and how long Plavix for 5 days   5. Practice name and name of physician performing surgery? [facility]   6. What is your office phone number [phone]    7.   What is your office fax number [phone]  8.   Anesthesia type (None, local, MAC, general) ? Local   Mady Haagensen 04/17/2019, 1:17 PM  _________________________________________________________________   (provider comments below)

## 2019-04-17 NOTE — Patient Instructions (Signed)
Medication Instructions:   STOP METOPROLOL TARTRATE (LOPRESSOR)  *If you need a refill on your cardiac medications before your next appointment, please call your pharmacy*  Lab Work: Your physician recommends that you return for lab work TODAY:  BMET  CBC  If you have labs (blood work) drawn today and your tests are completely normal, you will receive your results only by: [name] MyChart Message (if you have MyChart) OR . A paper copy in the mail If you have any lab test that is abnormal or we need to change your treatment, we will call you to review the results.  Testing/Procedures: Your physician has requested that you have a lower extremity arterial duplex. This test is an ultrasound of the arteries in the legs. It looks at arterial blood flow in the legs. Allow one hour for Lower and Arterial scans. There are no restrictions or special instructions   PLEASE SCHEDULE FOR TOMORROW OR Wednesday  Follow-Up: At [facility], you and your health needs are our priority.  As part of our continuing mission to provide you with exceptional heart care, we have created designated Provider Care Teams.  These Care Teams include your primary Cardiologist (physician) and Advanced Practice Providers (APPs -  Physician Assistants and Nurse Practitioners) who all work together to provide you with the care you need, when you need it.  Your next appointment:   KEEP APPOINTMENT AS SCHEDULED   The format for your next appointment:   In Person  Provider:   [name], MD  Other Instructions

## 2019-04-18 ENCOUNTER — Encounter (HOSPITAL_COMMUNITY): Payer: Self-pay | Admitting: *Deleted

## 2019-04-18 LAB — CBC
Hematocrit: 39.9 % (ref 37.5–51.0)
Hemoglobin: 14 g/dL (ref 13.0–17.7)
MCH: 32.4 pg (ref 26.6–33.0)
MCHC: 35.1 g/dL (ref 31.5–35.7)
MCV: 92 fL (ref 79–97)
Platelets: 247 10*3/uL (ref 150–450)
RBC: 4.32 x10E6/uL (ref 4.14–5.80)
RDW: 12 % (ref 11.6–15.4)
WBC: 8 10*3/uL (ref 3.4–10.8)

## 2019-04-18 LAB — BASIC METABOLIC PANEL
BUN/Creatinine Ratio: 12 (ref 10–24)
BUN: 16 mg/dL (ref 8–27)
CO2: 25 mmol/L (ref 20–29)
Calcium: 9.6 mg/dL (ref 8.6–10.2)
Chloride: 100 mmol/L (ref 96–106)
Creatinine, Ser: 1.39 mg/dL — ABNORMAL HIGH (ref 0.76–1.27)
GFR calc Af Amer: 57 mL/min/{1.73_m2} — ABNORMAL LOW (ref >59)
GFR calc non Af Amer: 49 mL/min/{1.73_m2} — ABNORMAL LOW (ref >59)
Glucose: 119 mg/dL — ABNORMAL HIGH (ref 65–99)
Potassium: 4.3 mmol/L (ref 3.5–5.2)
Sodium: 139 mmol/L (ref 134–144)

## 2019-04-18 NOTE — Progress Notes (Signed)
Referral received and verified for MD signature.  Follow up appt is 04/21/19.  Due to the senior leadership directed instructions to pause onsite cardiac rehab in response to the surge of Covid + patients within the health system staff were deployed to support health system and community needs.  Will wait to check pt insurance benefits and eligibility once onsite cardiac rehab is permitted to reopen. Pt will be contacted for scheduling [facility] [place] upon satisfactorily completing their follow up appt on 04/21/19. Cherre Huger, BSN Cardiac and Training and development officer

## 2019-04-19 ENCOUNTER — Ambulatory Visit (HOSPITAL_COMMUNITY)
Admission: RE | Admit: 2019-04-19 | Discharge: 2019-04-19 | Disposition: A | Discharge status: Home / Self Care | Payer: PPO | Source: Ambulatory Visit | Attending: Cardiology | Admitting: Cardiology

## 2019-04-19 ENCOUNTER — Telehealth: Payer: Self-pay | Admitting: Family Medicine

## 2019-04-19 ENCOUNTER — Other Ambulatory Visit: Payer: Self-pay | Admitting: Physician Assistant

## 2019-04-19 ENCOUNTER — Other Ambulatory Visit: Payer: Self-pay

## 2019-04-19 DIAGNOSIS — I97630 Postprocedural hematoma of a circulatory system organ or structure following a cardiac catheterization: Secondary | ICD-10-CM | POA: Diagnosis not present

## 2019-04-19 DIAGNOSIS — R1031 Right lower quadrant pain: Secondary | ICD-10-CM

## 2019-04-19 NOTE — Telephone Encounter (Signed)
Called and let patient know will call if any issues.

## 2019-04-19 NOTE — Progress Notes (Signed)
Kidney function, electrolyte and red blood cell count all stable.

## 2019-04-19 NOTE — Telephone Encounter (Signed)
Patient calls in this morning, saying he was he was prescribed PLAVIX 75 mg at the hospital and has been taking it for 6 days but woke up with a black tongue this morning. He called the hospital back and they told him to call and ask Dr.Hunter about this situation and what to do about it.

## 2019-04-19 NOTE — Telephone Encounter (Signed)
Since he was able to clean the soft palate monitor only for now-if he continues to have recurrent let me know-cardiology was also involved in a decision to change the medicine.

## 2019-04-19 NOTE — Progress Notes (Signed)
No pseudoaneurysm seen, test reassuring

## 2019-04-19 NOTE — Telephone Encounter (Signed)
Called patient he denies any other symptoms was started on Protonix and plavix at same time. He had black film on tongue this am and was able to clean off but no other issues.

## 2019-04-20 DIAGNOSIS — J3089 Other allergic rhinitis: Secondary | ICD-10-CM | POA: Diagnosis not present

## 2019-04-20 DIAGNOSIS — J301 Allergic rhinitis due to pollen: Secondary | ICD-10-CM | POA: Diagnosis not present

## 2019-04-20 DIAGNOSIS — Z7189 Other specified counseling: Secondary | ICD-10-CM | POA: Insufficient documentation

## 2019-04-20 DIAGNOSIS — J3081 Allergic rhinitis due to animal (cat) (dog) hair and dander: Secondary | ICD-10-CM | POA: Diagnosis not present

## 2019-04-20 NOTE — Progress Notes (Signed)
Cardiology Office Note   Date:  04/21/2019   ID:  Joshua [name] 1940-11-20, MRN [id number]  PCP:  [name], MD  Cardiologist:   [name], MD   Chief Complaint  Patient presents with  . Coronary Artery Disease      History of Present Illness: Joshua [name] is a 79 y.o. male who presents for follow up of CAD.  He had a negative perfusion study in 2015.  When I last saw him in Jan of 2020 he was having chest pain.   He had a POET (Plain Old Exercise Treadmill) and he had no ischemia.[name]  However, when I saw him recently he had chest pain and I sent him for a cardiac cath.    Cardiac catheterization revealed severe triple-vessel coronary artery disease status post 5 vessel bypass with 5 out of 5 patent bypass grafts.  The native LAD was occluded beyond the first diagonal branch.  Patient LIMA graft. The circumflex is occluded at the ostium.  The moderate caliber distal OM branch fills from the patent vein graft.  The small caliber more proximal OM fills retrograde from collaterals.  The native RCA is occluded proximally.  The vein graft to the distal RCA is patent. A DES was successfully placed to origin to proximal graft lesion saphenous graft to third marginal.  Patient had normal left ventricular systolic function.  He was discharged on aspirin and Plavix  He presented for follow-up couple of days ago and had significant bruising and swelling in his right groin but he had an ultrasound that has no AV fistula.  Of note he was taken off his beta-blocker at the last visit because of fatigue and he does think he feels better.  He is no longer getting the chest pain that prompted his catheterization.  He is not having any new palpitations, presyncope or syncope.  He has had no weight gain or edema.  Past Medical History:  Diagnosis Date  . Acute medial meniscal tear   . Anemia   . Arthritis    "middle finger right hand; right knee; neck" (02/06/2014)  . Asthma     . Bladder cancer ([place]) 04/2010   "cauterized during prostate OR"  . CAD (coronary artery disease)    CABG 2001  . Depression   . Diverticulosis   . DJD (degenerative joint disease)    BACK  . GERD (gastroesophageal reflux disease)   . H/O inguinal hernia repair 12/2018  . History of gout   . Hyperlipidemia   . Hypertension   . IBS (irritable bowel syndrome)   . Obesity   . OSA (obstructive sleep apnea)    USES CPAP   . Pancreatitis ~ 1980  . Prostate cancer ([place]) 04/2010    Past Surgical History:  Procedure Laterality Date  . ANTERIOR CERVICAL DECOMP/DISCECTOMY FUSION  05/26/2006   PLATE PLACED  . CARDIAC CATHETERIZATION  11/20/1999  . CORONARY ARTERY BYPASS GRAFT  11/20/1999   SVG-RI1-RI2, SVG-OM, SVG-dRCA  . CORONARY STENT INTERVENTION N/A 04/13/2019   Procedure: CORONARY STENT INTERVENTION;  Surgeon: Burnell Blanks, MD;  Location: [facility];  Service: Cardiovascular;  Laterality: N/A;  . INGUINAL HERNIA REPAIR Left 01/02/2019   Procedure: OPEN LEFT INGUINAL HERNIA REPAIR WITH MESH;  Surgeon: Johnathan Hausen, MD;  Location: [facility];  Service: General;  Laterality: Left;  . KNEE ARTHROSCOPY Right 1992; 04/2008  . LAPAROSCOPIC CHOLECYSTECTOMY  2007  . LEFT HEART CATH AND CORS/GRAFTS ANGIOGRAPHY  N/A 04/13/2019   Procedure: LEFT HEART CATH AND CORS/GRAFTS ANGIOGRAPHY;  Surgeon: Burnell Blanks, MD;  Location: [facility];  Service: Cardiovascular;  Laterality: N/A;  . NASAL SEPTUM SURGERY Right 2007  . ROBOT ASSISTED LAPAROSCOPIC RADICAL PROSTATECTOMY  04/2010  . THYROIDECTOMY, PARTIAL  1980     Current Outpatient Medications  Medication Sig Dispense Refill  . acetaminophen (TYLENOL) 500 MG tablet Take 1,000 mg by mouth every 6 (six) hours as needed for moderate pain or headache.     . albuterol (PROAIR HFA) 108 (90 BASE) MCG/ACT inhaler Inhale 1-2 puffs into the lungs every 6 (six) hours as needed for wheezing or shortness of breath. 18 g 3  .  aspirin EC 81 MG tablet Take 81 mg by mouth at bedtime.     . Cholecalciferol (VITAMIN D3) 50 MCG (2000 UT) TABS Take 2,000 Units by mouth daily.    . clopidogrel (PLAVIX) 75 MG tablet Take 1 tablet (75 mg total) by mouth daily with breakfast. 30 tablet 12  . diazepam (VALIUM) 5 MG tablet Take 1 tablet (5 mg total) by mouth at bedtime as needed. (Patient taking differently: Take 5 mg by mouth at bedtime as needed (sleep). ) 30 tablet 1  . Evolocumab (REPATHA SURECLICK) [id number] MG/ML SOAJ Inject 140 mg into the skin every 14 (fourteen) days. 6 pen 3  . FLOVENT HFA 110 MCG/ACT inhaler Inhale 1 puff into the lungs 2 (two) times daily.   5  . fluticasone (FLONASE) 50 MCG/ACT nasal spray Place 1 spray into both nostrils 2 (two) times daily.     . folic acid (FOLVITE) [id number] MCG tablet Take 800 mcg by mouth daily.     [name] gabapentin (NEURONTIN) 300 MG capsule Take 1 cap in AM, 1 cap at noon, 2 caps at bedtime (Patient taking differently: Take 300-600 mg by mouth See admin instructions. Take 1 capsule (300 mg) by mouth in the morning, take 1 capsule (300 mg) by mouth with lunch, & take 2 capsules (600 mg) by mouth at bedtime.) 360 capsule 3  . Glucosamine-Chondroitin (MOVE FREE PO) Take 1 tablet by mouth daily.    . hydrochlorothiazide (HYDRODIURIL) 25 MG tablet TAKE 1 TABLET BY MOUTH EVERY DAY (Patient taking differently: Take 25 mg by mouth daily with lunch. ) 90 tablet 0  . ketoconazole (NIZORAL) 2 % cream Apply 1 application topically 2 (two) times daily as needed for irritation.     [name] loratadine (CLARITIN) 10 MG tablet Take 10 mg by mouth at bedtime.     [name] losartan (COZAAR) 100 MG tablet TAKE 1 TABLET BY MOUTH EVERY DAY (Patient taking differently: Take 100 mg by mouth at bedtime. ) 90 tablet 3  . meclizine (ANTIVERT) 25 MG tablet Take 1 tablet (25 mg total) by mouth 3 (three) times daily as needed for dizziness. 30 tablet 0  . Miconazole Nitrate (LOTRIMIN AF DEODORANT POWDER) 2 % AERP Apply 1 spray topically 2  (two) times daily.    . ondansetron (ZOFRAN ODT) 8 MG disintegrating tablet Take 1 tablet (8 mg total) by mouth every 8 (eight) hours as needed for nausea or vomiting. 20 tablet 0  . pantoprazole (PROTONIX) 40 MG tablet Take 1 tablet (40 mg total) by mouth daily.    . polyethylene glycol powder (GLYCOLAX/MIRALAX) powder Take 17 g by mouth daily. (Patient taking differently: Take 17 g by mouth 2 (two) times daily. ) 255 g 0  . Probiotic Product (ALIGN PO) Take 7.5 g by  mouth daily.    [name] pyridOXINE (VITAMIN B-6) 100 MG tablet Take 100 mg by mouth daily with supper.    . traMADol (ULTRAM) 50 MG tablet Take 1 tablet (50 mg total) by mouth every 6 (six) hours as needed. 20 tablet 1  . vitamin B-12 (CYANOCOBALAMIN) 1000 MCG tablet Take 1,000 mcg by mouth daily.     No current facility-administered medications for this visit.    Allergies:   Benadryl [diphenhydramine], Bromfed, Clarithromycin, Codeine, Doxycycline hyclate, Modafinil, Oxycodone-acetaminophen, Promethazine hcl, Quinolones, Telithromycin, Hydrocodone-acetaminophen, Statins, and Sulfonamide derivatives    ROS:  Please see the history of present illness.   Otherwise, review of systems are positive for none.   All other systems are reviewed and negative.    PHYSICAL EXAM: VS:  BP 138/76   Pulse 81   Temp (!) 95.9 F (35.5 C)   Ht 5' 6.5" (1.689 m)   Wt 216 lb 12.8 oz (98.3 kg)   SpO2 95%   BMI 34.47 kg/m  , BMI Body mass index is 34.47 kg/m. GENERAL:  Well appearing NECK:  No jugular venous distention, waveform within normal limits, carotid upstroke brisk and symmetric, no bruits, no thyromegaly LUNGS:  Clear to auscultation bilaterally CHEST:  Unremarkable HEART:  PMI not displaced or sustained,S1 and S2 within normal limits, no S3, no S4, no clicks, no rubs, no murmurs ABD:  Flat, positive bowel sounds normal in frequency in pitch, no bruits, no rebound, no guarding, no midline pulsatile mass, no hepatomegaly, no  splenomegaly EXT:  2 plus pulses throughout, no edema, no cyanosis no clubbing, right groin with hematoma and ecchymosis but no pulsatile mass or bruit  EKG:  EKG is not ordered today.    Recent Labs: 10/27/2018: TSH 1.31 11/03/2018: ALT 22 04/17/2019: BUN 16; Creatinine, Ser 1.39; Hemoglobin 14.0; Platelets 247; Potassium 4.3; Sodium 139    Lipid Panel    Component Value Date/Time   CHOL 96 11/03/2018 0806   CHOL 81 (L) 12/30/2016 0848   TRIG 158.0 (H) 11/03/2018 0806   HDL 46.20 11/03/2018 0806   HDL 42 12/30/2016 0848   CHOLHDL 2 11/03/2018 0806   VLDL 31.6 11/03/2018 0806   LDLCALC 18 11/03/2018 0806   LDLCALC 16 10/01/2017 1703   LDLDIRECT 115.0 06/04/2016 0919      Wt Readings from Last 3 Encounters:  04/21/19 216 lb 12.8 oz (98.3 kg)  04/17/19 214 lb 3.2 oz (97.2 kg)  04/14/19 213 lb 4.8 oz (96.8 kg)      Other studies Reviewed: Additional studies/ records that were reviewed today include: None Review of the above records demonstrates:  NA  ASSESSMENT AND PLAN:  CAD:   The patient has no new sypmtoms.  No further cardiovascular testing is indicated.  We will continue with aggressive risk reduction and meds as listed.  HTN:   .The blood pressure is at target. No change in medications is indicated. We will continue with therapeutic lifestyle changes (TLC).  DYSLIPIDEMIA:   LDL was 18 and HDL 46.  No change in therapy.   COVID 19 EDUCATION:    He is trying to get the vaccine.     Current medicines are reviewed at length with the patient today.  The patient does not have concerns regarding medicines.  The following changes have been made:  no change  Labs/ tests ordered today include: None  No orders of the defined types were placed in this encounter.    Disposition:   FU with Almyra Deforest,  PAc in about 3 months.    Signed, [name], MD  04/21/2019 12:45 PM    [place]

## 2019-04-21 ENCOUNTER — Encounter: Payer: Self-pay | Admitting: Cardiology

## 2019-04-21 ENCOUNTER — Other Ambulatory Visit: Payer: Self-pay

## 2019-04-21 ENCOUNTER — Ambulatory Visit (INDEPENDENT_AMBULATORY_CARE_PROVIDER_SITE_OTHER): Payer: PPO | Admitting: Cardiology

## 2019-04-21 VITALS — BP 138/76 | HR 81 | Temp 95.9°F | Ht 66.5 in | Wt 216.8 lb

## 2019-04-21 DIAGNOSIS — I25110 Atherosclerotic heart disease of native coronary artery with unstable angina pectoris: Secondary | ICD-10-CM | POA: Diagnosis not present

## 2019-04-21 DIAGNOSIS — E785 Hyperlipidemia, unspecified: Secondary | ICD-10-CM | POA: Diagnosis not present

## 2019-04-21 DIAGNOSIS — Z7189 Other specified counseling: Secondary | ICD-10-CM

## 2019-04-21 DIAGNOSIS — I10 Essential (primary) hypertension: Secondary | ICD-10-CM

## 2019-04-21 NOTE — Patient Instructions (Signed)
Medication Instructions:  No Changes *If you need a refill on your cardiac medications before your next appointment, please call your pharmacy*  Lab Work: None  Testing/Procedures: None  Follow-Up: At [facility], you and your health needs are our priority.  As part of our continuing mission to provide you with exceptional heart care, we have created designated Provider Care Teams.  These Care Teams include your primary Cardiologist (physician) and Advanced Practice Providers (APPs -  Physician Assistants and Nurse Practitioners) who all work together to provide you with the care you need, when you need it.  Your next appointment:   3 month(s)  The format for your next appointment:   In Person  Provider:   Almyra Deforest

## 2019-04-22 ENCOUNTER — Ambulatory Visit: Payer: PPO

## 2019-04-24 DIAGNOSIS — J3089 Other allergic rhinitis: Secondary | ICD-10-CM | POA: Diagnosis not present

## 2019-04-24 DIAGNOSIS — J301 Allergic rhinitis due to pollen: Secondary | ICD-10-CM | POA: Diagnosis not present

## 2019-04-24 DIAGNOSIS — J3081 Allergic rhinitis due to animal (cat) (dog) hair and dander: Secondary | ICD-10-CM | POA: Diagnosis not present

## 2019-04-26 DIAGNOSIS — G4733 Obstructive sleep apnea (adult) (pediatric): Secondary | ICD-10-CM | POA: Diagnosis not present

## 2019-04-27 DIAGNOSIS — G4733 Obstructive sleep apnea (adult) (pediatric): Secondary | ICD-10-CM | POA: Diagnosis not present

## 2019-04-28 ENCOUNTER — Ambulatory Visit: Payer: PPO | Attending: Internal Medicine

## 2019-04-28 ENCOUNTER — Telehealth (HOSPITAL_COMMUNITY): Payer: Self-pay | Admitting: *Deleted

## 2019-04-28 DIAGNOSIS — Z23 Encounter for immunization: Secondary | ICD-10-CM | POA: Insufficient documentation

## 2019-04-28 NOTE — Telephone Encounter (Signed)
Called and spoke briefly with pt regarding scheduling for CR. Pt indicated he was on his way out the door to get his vaccination for Covid.  Pt asked for our contact information and will contact us on Monday. Cherre Huger, BSN Cardiac and Training and development officer

## 2019-04-28 NOTE — Progress Notes (Signed)
   Covid-19 Vaccination Clinic  Name:  Joshua [name]    MRN: [id number] DOB: 10/31/1940  04/28/2019  Mr. Mcdole was observed post Covid-19 immunization for 30 minutes based on pre-vaccination screening without incidence. He was provided with Vaccine Information Sheet and instruction to access the V-Safe system.   Mr. Bunting was instructed to call 911 with any severe reactions post vaccine: [name] Difficulty breathing  . Swelling of your face and throat  . A fast heartbeat  . A bad rash all over your body  . Dizziness and weakness    Immunizations Administered    Name Date Dose VIS Date Route   Pfizer COVID-19 Vaccine 04/28/2019  5:12 PM 0.3 mL 03/03/2019 Intramuscular   Manufacturer: [place]   Lot: [id number]   [place]: [id number]

## 2019-05-01 ENCOUNTER — Telehealth (HOSPITAL_COMMUNITY): Payer: Self-pay

## 2019-05-01 NOTE — Telephone Encounter (Signed)
Pt insurance is active and benefits verified through [employer] adv Co-pay $15, DED 0/0 met, out of pocket $3,400/0 met, co-insurance 0. no pre-authorization required, REF# [id number]  Will contact patient to see if he is interested in the Cardiac Rehab Program. If interested, patient will need to complete follow up appt. Once completed, patient will be contacted for scheduling upon review by the RN Navigator.

## 2019-05-02 ENCOUNTER — Telehealth (HOSPITAL_COMMUNITY): Payer: Self-pay

## 2019-05-02 NOTE — Telephone Encounter (Signed)
Cardiac Rehab Medication Review by a Pharmacist  Does the patient feel that his/her medications are working for him/her?  yes  Has the patient been experiencing any side effects to the medications prescribed?  yes  Gabapentin makes him tired but if he does not take it he is in a lot of pain. Patient reports he feels allergies problems but unsure if it is medication related.   Does the patient measure his/her own blood pressure or blood glucose at home?  Yes, Wife checks on a regular bases. Last few days reports blood pressure has been elevated. I encourage patient to follow-up with his PCP  Understanding of regimen: excellent Understanding of indications: excellent Potential of compliance: excellent  Pharmacist comments: n/a  Acey Lav, PharmD  PGY1 Acute Care Pharmacy Resident 05/02/2019 12:00 PM

## 2019-05-03 ENCOUNTER — Other Ambulatory Visit: Payer: Self-pay

## 2019-05-03 ENCOUNTER — Telehealth: Payer: Self-pay | Admitting: Cardiology

## 2019-05-03 ENCOUNTER — Encounter (HOSPITAL_COMMUNITY)
Admission: RE | Admit: 2019-05-03 | Discharge: 2019-05-03 | Disposition: A | Discharge status: Home / Self Care | Payer: PPO | Source: Ambulatory Visit | Attending: Cardiology | Admitting: Cardiology

## 2019-05-03 DIAGNOSIS — Z955 Presence of coronary angioplasty implant and graft: Secondary | ICD-10-CM | POA: Insufficient documentation

## 2019-05-03 NOTE — Telephone Encounter (Signed)
Spoke with Trish Fountain, RN. She reports patient had chest pain 3/10 when walking up an incline. He rested and chest pain resolved. Patient did not take any nitro for symptom relief. Patient is scheduled to start cardiac rehab tomorrow and Truddie Crumble wanted to verify it was okay for patient to participate. Information taken to Dr. Sallyanne Kuster. Since chest pain resolved with rest it is okay for patient to participate in cardiac rehab. If patient continues to have chest pain he will need evaluation. [place] updated.

## 2019-05-03 NOTE — Progress Notes (Signed)
Cardiac Rehab Note:  Successful telephone encounter to Joshua [name] to confirm Cardiac Rehab orientation appointment for 05/04/19 at 0930. Nursing assessment completed. Instructions for appointment provided. Patient screening for Covid-19 negative.   Of note, patient states he had chest discomfort ("sharp pain") this morning, 3/10 7 min into his exercise. He was walking up an incline. He continued to walk for another full min with continued discomfort. He then stopped his exercise and states his chest discomfort subsided and has not returned. Patient states he has also had this same discomfort "a couple of times when climbing stairs". Patient does not have script for NTG. Dr. Percival Spanish not in office today. Joshua Georgia, RN discussed with Dr. Sallyanne Kuster. Order received to allow patient to continue with his CR orientation and exercise sessions. Will follow patient closely and treat any adverse events related to exercise appropriately per protocol.   Pt is interested in participating in Virtual Cardiac and Pulmonary Rehab. Pt advised that Virtual Cardiac and Pulmonary Rehab is provided at no cost to the patient. Invitation to download the [employer] app text to [phone]  Checklist:  1. Pt has smart device  ie smartphone and/or ipad for downloading an app  Yes 2. Reliable internet/wifi service    Yes 3. Understands how to use their smartphone and navigate within an app.  Yes   Pt verbalized understanding and is in agreement.             Confirm Consent - In the setting of the current Covid19 crisis, you are scheduled for a phone visit with your Cardiac or Pulmonary team member.  Just as we do with many in-gym visits, in order for you to participate in this visit, we must obtain consent.  If you'd like, I can send this to your mychart (if signed up) or email for you to review.  Otherwise, I can obtain your verbal consent now.  By agreeing to a telephone visit, we'd like you to understand that the  technology does not allow for your Cardiac or Pulmonary Rehab team member to perform a physical assessment, and thus may limit their ability to fully assess your ability to perform exercise programs. If your provider identifies any concerns that need to be evaluated in person, we will make arrangements to do so.  Finally, though the technology is pretty good, we cannot assure that it will always work on either your or our end and we cannot ensure that we have a secure connection.  Cardiac and Pulmonary Rehab Telehealth visits and "At Home" cardiac and pulmonary rehab are provided at no cost to you.        Are you willing to proceed?"        STAFF: Did the patient verbally acknowledge consent to telehealth visit? Document YES/NO here: Yes   Joshua [name] , RN  Cardiac and Pulmonary Rehab Staff        Date 05/03/19 @ Time 1110

## 2019-05-03 NOTE — Telephone Encounter (Signed)
Joshua [name], the rehab nurse spoke with the patient in a standard pre-rehab nursing assessment.The patient is set to start Cardiac Rehab tomorrow .At this assessment, the nurse reports that the pt has had some slight chest pain/ discomfort during exercise that resolved with rest. The patient gave it a 3 on a 1-10 pain scale.  The Rehab nurse just wanted to know if it is still safe for the patient to do cardiac rehab tomorrow. Please contact her

## 2019-05-04 ENCOUNTER — Ambulatory Visit: Payer: PPO | Admitting: Medical

## 2019-05-04 ENCOUNTER — Encounter: Payer: Self-pay | Admitting: Medical

## 2019-05-04 ENCOUNTER — Telehealth: Payer: Self-pay | Admitting: *Deleted

## 2019-05-04 ENCOUNTER — Encounter (HOSPITAL_COMMUNITY): Payer: Self-pay

## 2019-05-04 ENCOUNTER — Encounter (HOSPITAL_COMMUNITY)
Admission: RE | Admit: 2019-05-04 | Discharge: 2019-05-04 | Disposition: A | Discharge status: Home / Self Care | Payer: PPO | Source: Ambulatory Visit | Attending: Cardiology | Admitting: Cardiology

## 2019-05-04 ENCOUNTER — Other Ambulatory Visit: Payer: Self-pay

## 2019-05-04 VITALS — BP 134/86 | HR 74 | Temp 97.0°F | Ht 65.5 in | Wt 215.0 lb

## 2019-05-04 VITALS — BP 126/70 | HR 90 | Temp 96.8°F | Ht 65.75 in | Wt 216.3 lb

## 2019-05-04 DIAGNOSIS — E785 Hyperlipidemia, unspecified: Secondary | ICD-10-CM | POA: Diagnosis not present

## 2019-05-04 DIAGNOSIS — I25118 Atherosclerotic heart disease of native coronary artery with other forms of angina pectoris: Secondary | ICD-10-CM | POA: Diagnosis not present

## 2019-05-04 DIAGNOSIS — Z955 Presence of coronary angioplasty implant and graft: Secondary | ICD-10-CM

## 2019-05-04 DIAGNOSIS — I10 Essential (primary) hypertension: Secondary | ICD-10-CM | POA: Diagnosis not present

## 2019-05-04 MED ORDER — AMLODIPINE BESYLATE 5 MG PO TABS: 5.0000 mg | ORAL_TABLET | Freq: Every day | ORAL | 3 refills | Status: DC

## 2019-05-04 NOTE — Progress Notes (Signed)
Cardiology Office Note   Date:  05/05/2019   ID:  Joshua, [name] December 08, 1940, MRN [id number]  PCP:  [name], MD  Cardiologist:  [name], MD EP: None  Chief Complaint  Patient presents with  . Chest Pain      History of Present Illness: Joshua [name] is a 79 y.o. male with PMH of CAD s/p CABG with subsequent PCI to SVG to LCx 04/13/19, HTN, HLD, OSA on CPAP, chronic back pain, and GERD, who presents for the evaluation of chest pain.  He was last seen by cardiology at an outpatient visit with Dr. Percival Spanish 04/21/19 in follow-up of recent PCI/DES to SVG-LCx 04/13/19. He had significant bruising/swelling of his groin site at that time which was improvement; no pseudoaneurysm on follow-up ultrasound. He had improvement in his fatigue and was without chest pain. He was recommended for ongoing risk factor modifications and follow-up in 3 months.   He was at cardiac rehab today and doing a 6 min walk test when he experienced chest tightness rated as a 4/10. He sat down at that time and symptoms resolved within a couple minutes. No associated SOB, diaphoresis, dizziness, lightheadedness or syncope. He has had one other episode a couple days ago which resolved with continued walking within 1 minute. Question whether he is having coronary artery spasms. No complaints of orthopnea, PND, or LE edema. Only other complaint is chronic pain from his shoulder and back.    Past Medical History:  Diagnosis Date  . Acute medial meniscal tear   . Anemia   . Arthritis    "middle finger right hand; right knee; neck" (02/06/2014)  . Asthma   . Bladder cancer ([place]) 04/2010   "cauterized during prostate OR"  . CAD (coronary artery disease)    CABG 2001  . Depression   . Diverticulosis   . DJD (degenerative joint disease)    BACK  . GERD (gastroesophageal reflux disease)   . H/O inguinal hernia repair 12/2018  . History of gout   . Hyperlipidemia   . Hypertension   . IBS  (irritable bowel syndrome)   . Obesity   . OSA (obstructive sleep apnea)    USES CPAP   . Pancreatitis ~ 1980  . Prostate cancer ([place]) 04/2010    Past Surgical History:  Procedure Laterality Date  . ANTERIOR CERVICAL DECOMP/DISCECTOMY FUSION  05/26/2006   PLATE PLACED  . CARDIAC CATHETERIZATION  11/20/1999  . CORONARY ARTERY BYPASS GRAFT  11/20/1999   SVG-RI1-RI2, SVG-OM, SVG-dRCA  . CORONARY STENT INTERVENTION N/A 04/13/2019   Procedure: CORONARY STENT INTERVENTION;  Surgeon: Burnell Blanks, MD;  Location: [facility];  Service: Cardiovascular;  Laterality: N/A;  . INGUINAL HERNIA REPAIR Left 01/02/2019   Procedure: OPEN LEFT INGUINAL HERNIA REPAIR WITH MESH;  Surgeon: Johnathan Hausen, MD;  Location: [facility];  Service: General;  Laterality: Left;  . KNEE ARTHROSCOPY Right 1992; 04/2008  . LAPAROSCOPIC CHOLECYSTECTOMY  2007  . LEFT HEART CATH AND CORS/GRAFTS ANGIOGRAPHY N/A 04/13/2019   Procedure: LEFT HEART CATH AND CORS/GRAFTS ANGIOGRAPHY;  Surgeon: Burnell Blanks, MD;  Location: [facility];  Service: Cardiovascular;  Laterality: N/A;  . NASAL SEPTUM SURGERY Right 2007  . ROBOT ASSISTED LAPAROSCOPIC RADICAL PROSTATECTOMY  04/2010  . THYROIDECTOMY, PARTIAL  1980     Current Outpatient Medications  Medication Sig Dispense Refill  . acetaminophen (TYLENOL) 500 MG tablet Take 1,000 mg by mouth every 6 (six) hours as needed  for moderate pain or headache.     . albuterol (PROAIR HFA) 108 (90 BASE) MCG/ACT inhaler Inhale 1-2 puffs into the lungs every 6 (six) hours as needed for wheezing or shortness of breath. 18 g 3  . amLODipine (NORVASC) 5 MG tablet Take 1 tablet (5 mg total) by mouth daily. 30 tablet 3  . aspirin EC 81 MG tablet Take 81 mg by mouth at bedtime.     . Cholecalciferol (VITAMIN D3) 50 MCG (2000 UT) TABS Take 2,000 Units by mouth daily.    . clopidogrel (PLAVIX) 75 MG tablet Take 1 tablet (75 mg total) by mouth daily with breakfast. 30 tablet  12  . diazepam (VALIUM) 5 MG tablet Take 1 tablet (5 mg total) by mouth at bedtime as needed. (Patient taking differently: Take 5 mg by mouth at bedtime as needed (sleep). ) 30 tablet 1  . EPINEPHrine 0.3 mg/0.3 mL IJ SOAJ injection See admin instructions. for allergic reaction    . Evolocumab (REPATHA SURECLICK) [id number] MG/ML SOAJ Inject 140 mg into the skin every 14 (fourteen) days. 6 pen 3  . FLOVENT HFA 110 MCG/ACT inhaler Inhale 1 puff into the lungs 2 (two) times daily.   5  . fluticasone (FLONASE) 50 MCG/ACT nasal spray Place 1 spray into both nostrils 2 (two) times daily.     . folic acid (FOLVITE) [id number] MCG tablet Take 800 mcg by mouth daily.     [name] gabapentin (NEURONTIN) 300 MG capsule Take 1 cap in AM, 1 cap at noon, 2 caps at bedtime (Patient taking differently: Take 300-600 mg by mouth See admin instructions. Take 1 capsule (300 mg) by mouth in the morning, take 1 capsule (300 mg) by mouth with lunch, & take 2 capsules (600 mg) by mouth at bedtime.) 360 capsule 3  . Glucosamine-Chondroitin (MOVE FREE PO) Take 1 tablet by mouth daily.    . hydrochlorothiazide (HYDRODIURIL) 25 MG tablet TAKE 1 TABLET BY MOUTH EVERY DAY (Patient taking differently: Take 25 mg by mouth daily with lunch. ) 90 tablet 0  . hydrocortisone 2.5 % ointment     . ketoconazole (NIZORAL) 2 % cream Apply 1 application topically 2 (two) times daily as needed for irritation.     [name] loratadine (CLARITIN) 10 MG tablet Take 10 mg by mouth at bedtime.     [name] losartan (COZAAR) 100 MG tablet TAKE 1 TABLET BY MOUTH EVERY DAY (Patient taking differently: Take 100 mg by mouth at bedtime. ) 90 tablet 3  . meclizine (ANTIVERT) 25 MG tablet Take 1 tablet (25 mg total) by mouth 3 (three) times daily as needed for dizziness. 30 tablet 0  . Miconazole Nitrate (LOTRIMIN AF DEODORANT POWDER) 2 % AERP Apply 1 spray topically 2 (two) times daily.    . Misc Natural Products (TART CHERRY ADVANCED PO) Take 1 tablet by mouth daily.    . ondansetron  (ZOFRAN ODT) 8 MG disintegrating tablet Take 1 tablet (8 mg total) by mouth every 8 (eight) hours as needed for nausea or vomiting. 20 tablet 0  . pantoprazole (PROTONIX) 40 MG tablet Take 1 tablet (40 mg total) by mouth daily.    . polyethylene glycol powder (GLYCOLAX/MIRALAX) powder Take 17 g by mouth daily. (Patient taking differently: Take 17 g by mouth 2 (two) times daily. ) 255 g 0  . Probiotic Product (ALIGN PO) Take 7.5 g by mouth daily.    [name] pyridOXINE (VITAMIN B-6) 100 MG tablet Take 100 mg by mouth daily with  supper.    . traMADol (ULTRAM) 50 MG tablet Take 1 tablet (50 mg total) by mouth every 6 (six) hours as needed. 20 tablet 1  . vitamin B-12 (CYANOCOBALAMIN) 1000 MCG tablet Take 1,000 mcg by mouth daily.     No current facility-administered medications for this visit.    Allergies:   Benadryl [diphenhydramine], Bromfed, Clarithromycin, Codeine, Doxycycline hyclate, Modafinil, Oxycodone-acetaminophen, Promethazine hcl, Quinolones, Telithromycin, Hydrocodone-acetaminophen, Statins, and Sulfonamide derivatives    Social History:  The patient  reports that he quit smoking about 26 years ago. His smoking use included cigarettes. He has a 35.00 pack-year smoking history. He has never used smokeless tobacco. He reports current alcohol use of about 7.0 standard drinks of alcohol per week. He reports that he does not use drugs.   Family History:  The patient's family history includes Diabetes in his brother; Heart disease in his father and mother; Hyperlipidemia in his father and mother.    ROS:  Please see the history of present illness.   Otherwise, review of systems are positive for none.   All other systems are reviewed and negative.    PHYSICAL EXAM: VS:  BP 134/86   Pulse 74   Temp (!) 97 F (36.1 C)   Ht 5' 5.5" (1.664 m)   Wt 215 lb (97.5 kg)   SpO2 95%   BMI 35.23 kg/m  , BMI Body mass index is 35.23 kg/m. GEN: Well nourished, well developed, in no acute  distress HEENT: sclera anicteric Neck: no JVD, carotid bruits, or masses Cardiac: RRR; no murmurs, rubs, or gallops, trace LE edema  Respiratory:  clear to auscultation bilaterally, normal work of breathing GI: soft, obese, nontender, nondistended, + BS MS: no deformity or atrophy Skin: warm and dry, no rash Neuro:  Strength and sensation are intact Psych: euthymic mood, full affect   EKG:  EKG is ordered today. The ekg ordered today demonstrates NSR, rate 74 bpm, no STE/D, no TWI  Recent Labs: 10/27/2018: TSH 1.31 11/03/2018: ALT 22 04/17/2019: BUN 16; Creatinine, Ser 1.39; Hemoglobin 14.0; Platelets 247; Potassium 4.3; Sodium 139    Lipid Panel    Component Value Date/Time   CHOL 96 11/03/2018 0806   CHOL 81 (L) 12/30/2016 0848   TRIG 158.0 (H) 11/03/2018 0806   HDL 46.20 11/03/2018 0806   HDL 42 12/30/2016 0848   CHOLHDL 2 11/03/2018 0806   VLDL 31.6 11/03/2018 0806   LDLCALC 18 11/03/2018 0806   LDLCALC 16 10/01/2017 1703   LDLDIRECT 115.0 06/04/2016 0919      Wt Readings from Last 3 Encounters:  05/04/19 216 lb 4.3 oz (98.1 kg)  05/04/19 215 lb (97.5 kg)  04/21/19 216 lb 12.8 oz (98.3 kg)      Other studies Reviewed: Additional studies/ records that were reviewed today include:   Left heart catheterization 04/13/19:  Prox RCA lesion is 100% stenosed.  SVG graft was visualized by angiography.  The graft exhibits no disease.  Ost Cx to Prox Cx lesion is 100% stenosed.  SVG graft was visualized by angiography.  SVG graft was visualized by angiography.  Prox LAD to Mid LAD lesion is 100% stenosed.  LIMA graft was visualized by angiography.  The graft exhibits no disease.  Origin to Prox Graft lesion is 80% stenosed.  A drug-eluting stent was successfully placed using a STENT RESOLUTE ONYX 3.0X18.  Post intervention, there is a 0% residual stenosis.  The left ventricular systolic function is normal.  LV end diastolic pressure is  normal.  The  left ventricular ejection fraction is 55-65% by visual estimate.  There is no mitral valve regurgitation.   1. 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 The native LAD is occluded just beyond the first diagonal branch. The mid and distal LAD fills from the patent LIMA graft 3. The ramus intermediate branches fill from the sequential vein graft which is patent with no disease 4. The Circumflex is occluded at the ostium. The moderate caliber distal OM branch fills from the patent vein graft. The small caliber more proximal OM fills retrograde from collaterals.  5. The native RCA is occluded proximally. The vein graft to the distal RCA is patent 5. Normal LV systolic function  Recommendations: Will monitor overnight. Will plan DAPT with ASA and Plavix for at least 6 months.     ASSESSMENT AND PLAN:  1. Chest pain in patient with CAD s/p CABG with recent PCI/DES of SVG to LCx 04/13/19: patient with an episode of chest pain while walking with cardiac rehab today. Resolved spontaneously with rest within a couple minutes. One other episode since his cath which resolved within a minute despite continued activity. Question whether he is having coronary artery spasms. EKG is non-ischemic today - Will trial amlodipine 5mg  daily for both BP management and possible antianginal/spasmotic - Continue aspirin and plavix - Continue statin - Okay to start rehab back on Monday  2. HTN: BP 134/86 today. He reports is is typically in the 140s-160s/70s-80s at home.  - Will start amlodipine as above - Continue HCTZ and losartan - Encouraged low sodium. Salty six.   3. Dyslipidemia: LDL 18 - Continue statin  4. OSA: compliant with CPAP  He was encouraged to get the COVID-19 vaccine.   Current medicines are reviewed at length with the patient today.  The patient does not have concerns regarding medicines.  The following changes have been made:  As above  Labs/ tests ordered  today include:   Orders Placed This Encounter  Procedures  . EKG 12-Lead     Disposition:   FU with Isaac Laud as scheduled 07/19/2019  Signed, Abigail Butts, PA-C  05/05/2019 5:12 PM

## 2019-05-04 NOTE — Telephone Encounter (Signed)
Received call from [place] at [facility] rehab Patient doing 6 min walk test, flat service at 4.58 sec started having 4 out of 10 chest pains Blood pressure 158/80 HR 111 O2 sat 96 %  Patient was immediately sat down and at 6 min 30 sec pain completely gone.  EKG ok per [facility] Patient has interstitial lung disease with recent PFT's which showed improvement.  He does not have NTG for PRN use nor is he taking BB Discussed with Dr Oval Linsey, patient needs to be evaluated in office  Scheduled appointment with Shanon Rosser PA for this morning He will bring EKG and copy of 6 min walk test

## 2019-05-04 NOTE — Progress Notes (Signed)
Cardiac Individual Treatment Plan  Patient Details  Name: Joshua [name] MRN: [id number] Date of Birth: April 24, 1940 Referring Provider:     [facility] from 05/04/2019 in [place]  Referring Provider  Dr. [name] MD      Initial Encounter Date:    CARDIAC REHAB [facility] from 05/04/2019 in [facility]  Date  05/04/19      Visit Diagnosis: Status post coronary artery stent placement  Patient's Home Medications on Admission:  Current Outpatient Medications:  .  acetaminophen (TYLENOL) 500 MG tablet, Take 1,000 mg by mouth every 6 (six) hours as needed for moderate pain or headache. , Disp: , Rfl:  .  albuterol (PROAIR HFA) 108 (90 BASE) MCG/ACT inhaler, Inhale 1-2 puffs into the lungs every 6 (six) hours as needed for wheezing or shortness of breath., Disp: 18 g, Rfl: 3 .  amLODipine (NORVASC) 5 MG tablet, Take 1 tablet (5 mg total) by mouth daily., Disp: 30 tablet, Rfl: 3 .  aspirin EC 81 MG tablet, Take 81 mg by mouth at bedtime. , Disp: , Rfl:  .  Cholecalciferol (VITAMIN D3) 50 MCG (2000 UT) TABS, Take 2,000 Units by mouth daily., Disp: , Rfl:  .  clopidogrel (PLAVIX) 75 MG tablet, Take 1 tablet (75 mg total) by mouth daily with breakfast., Disp: 30 tablet, Rfl: 12 .  diazepam (VALIUM) 5 MG tablet, Take 1 tablet (5 mg total) by mouth at bedtime as needed. (Patient taking differently: Take 5 mg by mouth at bedtime as needed (sleep). ), Disp: 30 tablet, Rfl: 1 .  EPINEPHrine 0.3 mg/0.3 mL IJ SOAJ injection, See admin instructions. for allergic reaction, Disp: , Rfl:  .  Evolocumab (REPATHA SURECLICK) [id number] MG/ML SOAJ, Inject 140 mg into the skin every 14 (fourteen) days., Disp: 6 pen, Rfl: 3 .  FLOVENT HFA 110 MCG/ACT inhaler, Inhale 1 puff into the lungs 2 (two) times daily. , Disp: , Rfl: 5 .  fluticasone (FLONASE) 50 MCG/ACT nasal spray, Place 1 spray into both nostrils 2 (two)  times daily. , Disp: , Rfl:  .  folic acid (FOLVITE) [id number] MCG tablet, Take 800 mcg by mouth daily. , Disp: , Rfl:  .  gabapentin (NEURONTIN) 300 MG capsule, Take 1 cap in AM, 1 cap at noon, 2 caps at bedtime (Patient taking differently: Take 300-600 mg by mouth See admin instructions. Take 1 capsule (300 mg) by mouth in the morning, take 1 capsule (300 mg) by mouth with lunch, & take 2 capsules (600 mg) by mouth at bedtime.), Disp: 360 capsule, Rfl: 3 .  Glucosamine-Chondroitin (MOVE FREE PO), Take 1 tablet by mouth daily., Disp: , Rfl:  .  hydrochlorothiazide (HYDRODIURIL) 25 MG tablet, TAKE 1 TABLET BY MOUTH EVERY DAY (Patient taking differently: Take 25 mg by mouth daily with lunch. ), Disp: 90 tablet, Rfl: 0 .  hydrocortisone 2.5 % ointment, , Disp: , Rfl:  .  ketoconazole (NIZORAL) 2 % cream, Apply 1 application topically 2 (two) times daily as needed for irritation. , Disp: , Rfl:  .  loratadine (CLARITIN) 10 MG tablet, Take 10 mg by mouth at bedtime. , Disp: , Rfl:  .  losartan (COZAAR) 100 MG tablet, TAKE 1 TABLET BY MOUTH EVERY DAY (Patient taking differently: Take 100 mg by mouth at bedtime. ), Disp: 90 tablet, Rfl: 3 .  meclizine (ANTIVERT) 25 MG tablet, Take 1 tablet (25 mg total) by mouth 3 (  three) times daily as needed for dizziness., Disp: 30 tablet, Rfl: 0 .  Miconazole Nitrate (LOTRIMIN AF DEODORANT POWDER) 2 % AERP, Apply 1 spray topically 2 (two) times daily., Disp: , Rfl:  .  Misc Natural Products (TART CHERRY ADVANCED PO), Take 1 tablet by mouth daily., Disp: , Rfl:  .  ondansetron (ZOFRAN ODT) 8 MG disintegrating tablet, Take 1 tablet (8 mg total) by mouth every 8 (eight) hours as needed for nausea or vomiting., Disp: 20 tablet, Rfl: 0 .  pantoprazole (PROTONIX) 40 MG tablet, Take 1 tablet (40 mg total) by mouth daily., Disp:  , Rfl:  .  polyethylene glycol powder (GLYCOLAX/MIRALAX) powder, Take 17 g by mouth daily. (Patient taking differently: Take 17 g by mouth 2 (two) times  daily. ), Disp: 255 g, Rfl: 0 .  Probiotic Product (ALIGN PO), Take 7.5 g by mouth daily., Disp: , Rfl:  .  pyridOXINE (VITAMIN B-6) 100 MG tablet, Take 100 mg by mouth daily with supper., Disp: , Rfl:  .  traMADol (ULTRAM) 50 MG tablet, Take 1 tablet (50 mg total) by mouth every 6 (six) hours as needed., Disp: 20 tablet, Rfl: 1 .  vitamin B-12 (CYANOCOBALAMIN) 1000 MCG tablet, Take 1,000 mcg by mouth daily., Disp: , Rfl:   Past Medical History: Past Medical History:  Diagnosis Date  . Acute medial meniscal tear   . Anemia   . Arthritis    "middle finger right hand; right knee; neck" (02/06/2014)  . Asthma   . Bladder cancer ([place]) 04/2010   "cauterized during prostate OR"  . CAD (coronary artery disease)    CABG 2001  . Depression   . Diverticulosis   . DJD (degenerative joint disease)    BACK  . GERD (gastroesophageal reflux disease)   . H/O inguinal hernia repair 12/2018  . History of gout   . Hyperlipidemia   . Hypertension   . IBS (irritable bowel syndrome)   . Obesity   . OSA (obstructive sleep apnea)    USES CPAP   . Pancreatitis ~ 1980  . Prostate cancer ([place]) 04/2010    Tobacco Use: Social History   Tobacco Use  Smoking Status Former Smoker  . Packs/day: 1.00  . Years: 35.00  . Pack years: 35.00  . Types: Cigarettes  . Quit date: 06/16/1992  . Years since quitting: 26.8  Smokeless Tobacco Never Used    Labs: Recent Review Flowsheet Data    Labs for ITP Cardiac and Pulmonary Rehab Latest Ref Rng & Units 12/30/2016 10/01/2017 01/27/2018 05/05/2018 11/03/2018   Cholestrol 0 - 200 mg/dL 81(L) 94 95 - 96   LDLCALC 0 - 99 mg/dL 9 16 16  - 18   LDLDIRECT mg/dL - - - - -   HDL >39.00 mg/dL 42 44 49 - 46.20   Trlycerides 0.0 - 149.0 mg/dL 151(H) 322(H) 149 - 158.0(H)   Hemoglobin A1c 4.6 - 6.5 % - 5.8(H) - 5.7 5.8      Capillary Blood Glucose: No results found for: GLUCAP   Exercise Target Goals: Exercise Program Goal: Individual exercise prescription set  using results from initial 6 min walk test and THRR while considering  patient's activity barriers and safety.   Exercise Prescription Goal: Initial exercise prescription builds to 30-45 minutes a day of aerobic activity, 2-3 days per week.  Home exercise guidelines will be given to patient during program as part of exercise prescription that the participant will acknowledge.  Activity Barriers & Risk Stratification: Activity Barriers &  Cardiac Risk Stratification - 05/04/19 1226      Activity Barriers & Cardiac Risk Stratification   Activity Barriers  Back Problems;Deconditioning;Other (comment);Arthritis    Comments  bilateral foot neuropathy    Cardiac Risk Stratification  Moderate       6 Minute Walk: 6 Minute Walk    Row Name 05/04/19 1151         6 Minute Walk   Phase  Initial     Distance  1400 feet     Distance % Change  0 %     Distance Feet Change  0 ft     Walk Time  4.58 minutes     # of Rest Breaks  1     MPH  2.65     METS  2.63     RPE  11     Perceived Dyspnea   0     VO2 Peak  9.2     Symptoms  Yes (comment)     Comments  Pt reported 4/10 Chest Pain at 4 minutes and 58 seconds. Test stopped. CP resolved at minute 7. Pt denied feeling SOB or dizzy.     Resting HR  90 bpm     Resting BP  126/70     Exercise Oxygen Saturation  during 6 min walk  96 %     Max Ex. HR  111 bpm     Max Ex. BP  158/80     2 Minute Post BP  136/82        Oxygen Initial Assessment:   Oxygen Re-Evaluation:   Oxygen Discharge (Final Oxygen Re-Evaluation):   Initial Exercise Prescription: Initial Exercise Prescription - 05/04/19 1300      Date of Initial Exercise RX and Referring Provider   Date  05/04/19    Referring Provider  Dr. [name] MD    Expected Discharge Date  06/30/19      NuStep   Level  1    SPM  80    Minutes  15    METs  2.2      Arm Ergometer   Level  1.5    Watts  30    Minutes  15    METs  2      Prescription Details   Frequency  (times per week)  3x    Duration  Progress to 10 minutes continuous walking  at current work load and total walking time to 30-45 min      Intensity   THRR 40-80% of Max Heartrate  58-115    Ratings of Perceived Exertion  11-13    Perceived Dyspnea  0-4      Progression   Progression  Continue progressive overload as per policy without signs/symptoms or physical distress.      Resistance Training   Training Prescription  Yes    Weight  2lbs    Reps  10-15       Perform Capillary Blood Glucose checks as needed.  Exercise Prescription Changes:   Exercise Comments:   Exercise Goals and Review:  Exercise Goals    Row Name 05/04/19 1227             Exercise Goals   Increase Physical Activity  Yes       Intervention  Provide advice, education, support and counseling about physical activity/exercise needs.;Develop an individualized exercise prescription for aerobic and resistive training based on initial evaluation findings, risk stratification, comorbidities and participant's personal goals.  Expected Outcomes  Short Term: Attend rehab on a regular basis to increase amount of physical activity.;Long Term: Add in home exercise to make exercise part of routine and to increase amount of physical activity.;Long Term: Exercising regularly at least 3-5 days a week.       Increase Strength and Stamina  Yes       Intervention  Provide advice, education, support and counseling about physical activity/exercise needs.;Develop an individualized exercise prescription for aerobic and resistive training based on initial evaluation findings, risk stratification, comorbidities and participant's personal goals.       Expected Outcomes  Short Term: Increase workloads from initial exercise prescription for resistance, speed, and METs.;Short Term: Perform resistance training exercises routinely during rehab and add in resistance training at home;Long Term: Improve cardiorespiratory fitness,  muscular endurance and strength as measured by increased METs and functional capacity (6MWT)       Able to understand and use rate of perceived exertion (RPE) scale  Yes       Intervention  Provide education and explanation on how to use RPE scale       Expected Outcomes  Short Term: Able to use RPE daily in rehab to express subjective intensity level;Long Term:  Able to use RPE to guide intensity level when exercising independently       Knowledge and understanding of Target Heart Rate Range (THRR)  Yes       Intervention  Provide education and explanation of THRR including how the numbers were predicted and where they are located for reference       Expected Outcomes  Short Term: Able to state/look up THRR;Long Term: Able to use THRR to govern intensity when exercising independently;Short Term: Able to use daily as guideline for intensity in rehab       Able to check pulse independently  Yes       Intervention  Provide education and demonstration on how to check pulse in carotid and radial arteries.;Review the importance of being able to check your own pulse for safety during independent exercise       Expected Outcomes  Short Term: Able to explain why pulse checking is important during independent exercise;Long Term: Able to check pulse independently and accurately       Understanding of Exercise Prescription  Yes       Intervention  Provide education, explanation, and written materials on patient's individual exercise prescription       Expected Outcomes  Short Term: Able to explain program exercise prescription;Long Term: Able to explain home exercise prescription to exercise independently          Exercise Goals Re-Evaluation :   Discharge Exercise Prescription (Final Exercise Prescription Changes):   Nutrition:  Target Goals: Understanding of nutrition guidelines, daily intake of sodium 1500mg , cholesterol 200mg , calories 30% from fat and 7% or less from saturated fats, daily to have  5 or more servings of fruits and vegetables.  Biometrics: Pre Biometrics - 05/04/19 1318      Pre Biometrics   Height  5' 5.75" (1.67 m)        Nutrition Therapy Plan and Nutrition Goals:   Nutrition Assessments:   Nutrition Goals Re-Evaluation:   Nutrition Goals Re-Evaluation:   Nutrition Goals Discharge (Final Nutrition Goals Re-Evaluation):   Psychosocial: Target Goals: Acknowledge presence or absence of significant depression and/or stress, maximize coping skills, provide positive support system. Participant is able to verbalize types and ability to use techniques and skills needed for reducing  stress and depression.  Initial Review & Psychosocial Screening: Initial Psych Review & Screening - 05/04/19 1146      Initial Review   Current issues with  None Identified      Family Dynamics   Good Support System?  Yes    Comments  Mr. Rousselle has a positive outlook and attitude. He denies psychosocial barriers to self health management and participation in cardiac rehab. He has a very small support system that includes his spouse. They do not have children. 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      Barriers   Psychosocial barriers to participate in program  There are no identifiable barriers or psychosocial needs.      Screening Interventions   Interventions  Encouraged to exercise       Quality of Life Scores:  Scores of 19 and below usually indicate a poorer quality of life in these areas.  A difference of  2-3 points is a clinically meaningful difference.  A difference of 2-3 points in the total score of the Quality of Life Index has been associated with significant improvement in overall quality of life, self-image, physical symptoms, and general health in studies assessing change in quality of life.  PHQ-9: Recent Review Flowsheet Data    Depression screen [facility] 2/9 05/04/2019 02/06/2019 11/02/2018 05/05/2018 02/02/2018    Decreased Interest 0 0 0 0 0   Down, Depressed, Hopeless 1 1 1  0 1   PHQ - 2 Score 1 1 1  0 1   Altered sleeping - 0 1 0 1   Tired, decreased energy - 1 2 0 1   Change in appetite - 0 2 0 2   Feeling bad or failure about yourself  - 0 0 0 1   Trouble concentrating - 0 0 0 0   Moving slowly or fidgety/restless - 0 0 0 1   Suicidal thoughts - 0 0 0 0   PHQ-9 Score - 2 6 0 7   Difficult doing work/chores - Not difficult at all Not difficult at all Not difficult at all Not difficult at all     Interpretation of Total Score  Total Score Depression Severity:  1-4 = Minimal depression, 5-9 = Mild depression, 10-14 = Moderate depression, 15-19 = Moderately severe depression, 20-27 = Severe depression   Psychosocial Evaluation and Intervention:   Psychosocial Re-Evaluation:   Psychosocial Discharge (Final Psychosocial Re-Evaluation):   Vocational Rehabilitation: Provide vocational rehab assistance to qualifying candidates.   Vocational Rehab Evaluation & Intervention: Vocational Rehab - 05/04/19 1146      Initial Vocational Rehab Evaluation & Intervention   Assessment shows need for Vocational Rehabilitation  No       Education: Education Goals: Education classes will be provided on a weekly basis, covering required topics. Participant will state understanding/return demonstration of topics presented.  Learning Barriers/Preferences: Learning Barriers/Preferences - 05/04/19 1146      Learning Barriers/Preferences   Learning Barriers  None    Learning Preferences  Individual Instruction       Education Topics: Count Your Pulse:  -Group instruction provided by verbal instruction, demonstration, patient participation and written materials to support subject.  Instructors address importance of being able to find your pulse and how to count your pulse when at home without a heart monitor.  Patients get hands on experience counting their pulse with staff help and  individually.   Heart Attack, Angina, and Risk Factor Modification:  -Group instruction  provided by verbal instruction, video, and written materials to support subject.  Instructors address signs and symptoms of angina and heart attacks.    Also discuss risk factors for heart disease and how to make changes to improve heart health risk factors.   Functional Fitness:  -Group instruction provided by verbal instruction, demonstration, patient participation, and written materials to support subject.  Instructors address safety measures for doing things around the house.  Discuss how to get up and down off the floor, how to pick things up properly, how to safely get out of a chair without assistance, and balance training.   Meditation and Mindfulness:  -Group instruction provided by verbal instruction, patient participation, and written materials to support subject.  Instructor addresses importance of mindfulness and meditation practice to help reduce stress and improve awareness.  Instructor also leads participants through a meditation exercise.    Stretching for Flexibility and Mobility:  -Group instruction provided by verbal instruction, patient participation, and written materials to support subject.  Instructors lead participants through series of stretches that are designed to increase flexibility thus improving mobility.  These stretches are additional exercise for major muscle groups that are typically performed during regular warm up and cool down.   Hands Only CPR:  -Group verbal, video, and participation provides a basic overview of AHA guidelines for community CPR. Role-play of emergencies allow participants the opportunity to practice calling for help and chest compression technique with discussion of AED use.   Hypertension: -Group verbal and written instruction that provides a basic overview of hypertension including the most recent diagnostic guidelines, risk factor reduction with  self-care instructions and medication management.    Nutrition I class: Heart Healthy Eating:  -Group instruction provided by PowerPoint slides, verbal discussion, and written materials to support subject matter. The instructor gives an explanation and review of the Therapeutic Lifestyle Changes diet recommendations, which includes a discussion on lipid goals, dietary fat, sodium, fiber, plant stanol/sterol esters, sugar, and the components of a well-balanced, healthy diet.   Nutrition II class: Lifestyle Skills:  -Group instruction provided by PowerPoint slides, verbal discussion, and written materials to support subject matter. The instructor gives an explanation and review of label reading, grocery shopping for heart health, heart healthy recipe modifications, and ways to make healthier choices when eating out.   Diabetes Question & Answer:  -Group instruction provided by PowerPoint slides, verbal discussion, and written materials to support subject matter. The instructor gives an explanation and review of diabetes co-morbidities, pre- and post-prandial blood glucose goals, pre-exercise blood glucose goals, signs, symptoms, and treatment of hypoglycemia and hyperglycemia, and foot care basics.   Diabetes Blitz:  -Group instruction provided by PowerPoint slides, verbal discussion, and written materials to support subject matter. The instructor gives an explanation and review of the physiology behind type 1 and type 2 diabetes, diabetes medications and rational behind using different medications, pre- and post-prandial blood glucose recommendations and Hemoglobin A1c goals, diabetes diet, and exercise including blood glucose guidelines for exercising safely.    Portion Distortion:  -Group instruction provided by PowerPoint slides, verbal discussion, written materials, and food models to support subject matter. The instructor gives an explanation of serving size versus portion size, changes in  portions sizes over the last 20 years, and what consists of a serving from each food group.   Stress Management:  -Group instruction provided by verbal instruction, video, and written materials to support subject matter.  Instructors review role of stress in heart disease and how  to cope with stress positively.     Exercising on Your Own:  -Group instruction provided by verbal instruction, power point, and written materials to support subject.  Instructors discuss benefits of exercise, components of exercise, frequency and intensity of exercise, and end points for exercise.  Also discuss use of nitroglycerin and activating EMS.  Review options of places to exercise outside of rehab.  Review guidelines for sex with heart disease.   Cardiac Drugs I:  -Group instruction provided by verbal instruction and written materials to support subject.  Instructor reviews cardiac drug classes: antiplatelets, anticoagulants, beta blockers, and statins.  Instructor discusses reasons, side effects, and lifestyle considerations for each drug class.   Cardiac Drugs II:  -Group instruction provided by verbal instruction and written materials to support subject.  Instructor reviews cardiac drug classes: angiotensin converting enzyme inhibitors (ACE-I), angiotensin II receptor blockers (ARBs), nitrates, and calcium channel blockers.  Instructor discusses reasons, side effects, and lifestyle considerations for each drug class.   Anatomy and Physiology of the Circulatory System:  Group verbal and written instruction and models provide basic cardiac anatomy and physiology, with the coronary electrical and arterial systems. Review of: AMI, Angina, Valve disease, Heart Failure, Peripheral Artery Disease, Cardiac Arrhythmia, Pacemakers, and the ICD.   Other Education:  -Group or individual verbal, written, or video instructions that support the educational goals of the cardiac rehab program.   Holiday Eating Survival  Tips:  -Group instruction provided by PowerPoint slides, verbal discussion, and written materials to support subject matter. The instructor gives patients tips, tricks, and techniques to help them not only survive but enjoy the holidays despite the onslaught of food that accompanies the holidays.   Knowledge Questionnaire Score:   Core Components/Risk Factors/Patient Goals at Admission:   Core Components/Risk Factors/Patient Goals Review:    Core Components/Risk Factors/Patient Goals at Discharge (Final Review):    ITP Comments: ITP Comments    Row Name 05/04/19 1000           ITP Comments  Dr. Fransico Him, Medical Director [facility] Cardiac Rehab          Comments: Patient attended orientation on 05/04/2019 to review rules and guidelines for program.  Completed 6 minute walk test, Intitial ITP, and exercise prescription.  VSS. Telemetry-NSR. Patient developed mid-sternal sharp chest discomfort 4/10 44min 58 seconds into his 6 min walk test. He was immediately seated and pain completely resolved by 7 min. See 6 min walk vitals as documented above in ITP. Denied symptoms of N/V, diaphoresis, shortness of breath. EKG obtained. Notified Rip Harbour, RN at [facility]. Dr. Oval Linsey request patient be seen in office today. Appointment made for 11:15. Patient discharged with copy of EKG, 6 min walk test and rhythm strips. He was discharged to MD office pain free. Will follow up with patient this afternoon for care coordination. Patient will be placed on hold from CR and VCR until cleared by cardiologist. Safety measures and social distancing in place per CDC guidelines.  Spoke to pt regarding Virtual Cardiac  and Pulmonary Rehab.  Pt  was able to download the [employer] app on their smart device with no issues. Pt set up their account and received the following welcome message -"Welcome to the [facility] and Pulmonary Rehabilitation program. We hope that you will find the exercise program  beneficial in your recovery process. Our staff is available to assist with any questions/concerns about your exercise routine. Best wishes". Brief orientation provided to with the advisement  to watch the "Intro to Rehab" series located under the Resource tab. Pt verbalized understanding. Will continue to follow and monitor pt progress with feedback as needed.

## 2019-05-04 NOTE — Patient Instructions (Signed)
Medication Instructions:  START AMLODIPINE 5MG  DAILY If you need a refill on your cardiac medications before your next appointment, please call your pharmacy.  Special Instructions: PLEASE FOLLOW LOW SODIUM DIT-ATTACHED  MAY RETURN TO CARDIA REHAB Monday 05-08-2019  PLEASE READ AND FOLLOW SALTY 6 ATTACHED  Reduce your risk of getting COVID-19 With your heart disease it is especially important for people at increased risk of severe illness from COVID-19, and those who live with them, to protect themselves from getting COVID-19. The best way to protect yourself and to help reduce the spread of the virus that causes COVID-19 is to: [name] Limit your interactions with other people as much as possible. . Take precautions to prevent getting COVID-19 when you do interact with others. If you start feeling sick and think you may have COVID-19, get in touch with your healthcare provider within 24 hours.  Follow-Up: AS SCHEDULED WITH HAO MENG, PA-C   At [facility], you and your health needs are our priority.  As part of our continuing mission to provide you with exceptional heart care, we have created designated Provider Care Teams.  These Care Teams include your primary Cardiologist (physician) and Advanced Practice Providers (APPs -  Physician Assistants and Nurse Practitioners) who all work together to provide you with the care you need, when you need it.  Thank you for choosing [facility] at [facility]!!        Low-Sodium Eating Plan Sodium, which is an element that makes up salt, helps you maintain a healthy balance of fluids in your body. Too much sodium can increase your blood pressure and cause fluid and waste to be held in your body. Your health care provider or dietitian may recommend following this plan if you have high blood pressure (hypertension), kidney disease, liver disease, or heart failure. Eating less sodium can help lower your blood pressure, reduce swelling, and protect  your heart, liver, and kidneys. What are tips for following this plan? General guidelines  Most people on this plan should limit their sodium intake to 1,500-2,000 mg (milligrams) of sodium each day. Reading food labels   The Nutrition Facts label lists the amount of sodium in one serving of the food. If you eat more than one serving, you must multiply the listed amount of sodium by the number of servings.  Choose foods with less than 140 mg of sodium per serving.  Avoid foods with 300 mg of sodium or more per serving. Shopping  Look for lower-sodium products, often labeled as "low-sodium" or "no salt added."  Always check the sodium content even if foods are labeled as "unsalted" or "no salt added".  Buy fresh foods. ? Avoid canned foods and premade or frozen meals. ? Avoid canned, cured, or processed meats  Buy breads that have less than 80 mg of sodium per slice. Cooking  Eat more home-cooked food and less restaurant, buffet, and fast food.  Avoid adding salt when cooking. Use salt-free seasonings or herbs instead of table salt or sea salt. Check with your health care provider or pharmacist before using salt substitutes.  Cook with plant-based oils, such as canola, sunflower, or olive oil. Meal planning  When eating at a restaurant, ask that your food be prepared with less salt or no salt, if possible.  Avoid foods that contain MSG (monosodium glutamate). MSG is sometimes added to [place] food, bouillon, and some canned foods. What foods are recommended? The items listed may not be a complete list. Talk with your  dietitian about what dietary choices are best for you. Grains Low-sodium cereals, including oats, puffed wheat and rice, and shredded wheat. Low-sodium crackers. Unsalted rice. Unsalted pasta. Low-sodium bread. Whole-grain breads and whole-grain pasta. Vegetables Fresh or frozen vegetables. "No salt added" canned vegetables. "No salt added" tomato sauce and paste.  Low-sodium or reduced-sodium tomato and vegetable juice. Fruits Fresh, frozen, or canned fruit. Fruit juice. Meats and other protein foods Fresh or frozen (no salt added) meat, poultry, seafood, and fish. Low-sodium canned tuna and salmon. Unsalted nuts. Dried peas, beans, and lentils without added salt. Unsalted canned beans. Eggs. Unsalted nut butters. Dairy Milk. Soy milk. Cheese that is naturally low in sodium, such as ricotta cheese, fresh mozzarella, or Swiss cheese Low-sodium or reduced-sodium cheese. Cream cheese. Yogurt. Fats and oils Unsalted butter. Unsalted margarine with no trans fat. Vegetable oils such as canola or olive oils. Seasonings and other foods Fresh and dried herbs and spices. Salt-free seasonings. Low-sodium mustard and ketchup. Sodium-free salad dressing. Sodium-free light mayonnaise. Fresh or refrigerated horseradish. Lemon juice. Vinegar. Homemade, reduced-sodium, or low-sodium soups. Unsalted popcorn and pretzels. Low-salt or salt-free chips. What foods are not recommended? The items listed may not be a complete list. Talk with your dietitian about what dietary choices are best for you. Grains Instant hot cereals. Bread stuffing, pancake, and biscuit mixes. Croutons. Seasoned rice or pasta mixes. Noodle soup cups. Boxed or frozen macaroni and cheese. Regular salted crackers. Self-rising flour. Vegetables Sauerkraut, pickled vegetables, and relishes. Olives. [place] fries. Onion rings. Regular canned vegetables (not low-sodium or reduced-sodium). Regular canned tomato sauce and paste (not low-sodium or reduced-sodium). Regular tomato and vegetable juice (not low-sodium or reduced-sodium). Frozen vegetables in sauces. Meats and other protein foods Meat or fish that is salted, canned, smoked, spiced, or pickled. Bacon, ham, sausage, hotdogs, corned beef, chipped beef, packaged lunch meats, salt pork, jerky, pickled herring, anchovies, regular canned tuna, sardines, salted  nuts. Dairy Processed cheese and cheese spreads. Cheese curds. Blue cheese. Feta cheese. String cheese. Regular cottage cheese. Buttermilk. Canned milk. Fats and oils Salted butter. Regular margarine. Ghee. Bacon fat. Seasonings and other foods Onion salt, garlic salt, seasoned salt, table salt, and sea salt. Canned and packaged gravies. Worcestershire sauce. Tartar sauce. Barbecue sauce. Teriyaki sauce. Soy sauce, including reduced-sodium. Steak sauce. Fish sauce. Oyster sauce. Cocktail sauce. Horseradish that you find on the shelf. Regular ketchup and mustard. Meat flavorings and tenderizers. Bouillon cubes. Hot sauce and Tabasco sauce. Premade or packaged marinades. Premade or packaged taco seasonings. Relishes. Regular salad dressings. Salsa. Potato and tortilla chips. Corn chips and puffs. Salted popcorn and pretzels. Canned or dried soups. Pizza. Frozen entrees and pot pies. Summary  Eating less sodium can help lower your blood pressure, reduce swelling, and protect your heart, liver, and kidneys.  Most people on this plan should limit their sodium intake to 1,500-2,000 mg (milligrams) of sodium each day.  Canned, boxed, and frozen foods are high in sodium. Restaurant foods, fast foods, and pizza are also very high in sodium. You also get sodium by adding salt to food.  Try to cook at home, eat more fresh fruits and vegetables, and eat less fast food, canned, processed, or prepared foods. This information is not intended to replace advice given to you by your health care provider. Make sure you discuss any questions you have with your health care provider.

## 2019-05-05 ENCOUNTER — Telehealth (HOSPITAL_COMMUNITY): Payer: Self-pay

## 2019-05-05 ENCOUNTER — Encounter: Payer: Self-pay | Admitting: Medical

## 2019-05-05 NOTE — Telephone Encounter (Signed)
Cardiac Rehab Secure Chat Note with Joshua Lofts, PA-C  "Hi Joshua [name]. I am following up on Joshua [name] appointment yesterday for exertional chest discomfort. I see you have added Norvasc 5mg . Does he have medical clearence to begin Cardiac Rehab exercise 05/08/19? Thank you, Analina Filla E. Rollene Rotunda, RN"   "Yes he does! Thanks for following up. Suspect he may have some spasm. My note will be in later this afternoon." Order received, Shailee Foots E. Rollene Rotunda, [place] Rollene Rotunda RN, BSN [place]. [facility]  Cardiac and Pulmonary Rehabilitation [place] Direct: [phone]

## 2019-05-07 ENCOUNTER — Other Ambulatory Visit: Payer: Self-pay | Admitting: Interventional Cardiology

## 2019-05-08 ENCOUNTER — Encounter (HOSPITAL_COMMUNITY)
Admission: RE | Admit: 2019-05-08 | Discharge: 2019-05-08 | Disposition: A | Discharge status: Home / Self Care | Payer: PPO | Source: Ambulatory Visit | Attending: Cardiology | Admitting: Cardiology

## 2019-05-08 ENCOUNTER — Other Ambulatory Visit: Payer: Self-pay

## 2019-05-08 DIAGNOSIS — Z955 Presence of coronary angioplasty implant and graft: Secondary | ICD-10-CM

## 2019-05-08 NOTE — Telephone Encounter (Signed)
Pt called and stated that he can attend his cardiac rehab session on 05/10/2019@8 :45am. Advised pt that I would put his appointment back for that date.

## 2019-05-08 NOTE — Progress Notes (Signed)
Daily Session Note  Patient Details  Name: Joshua [name] MRN: [id number] Date of Birth: 1940-12-19 Referring Provider:     [place] from 05/04/2019 in [place]  Referring Provider  Dr. [name] MD      Encounter Date: 05/08/2019  Check In: Session Check In - 05/08/19 0846      Check-In   Supervising physician immediately available to respond to emergencies  [facility] immediately available    Physician(s)  Dr. Maylene Roes    Location  [facility]    Staff Present  Jiles Garter, RN, BSN;Dalton Kris Mouton, MS, Exercise Physiologist;Olinty Celesta Aver, MS, ACSM CEP, Exercise Physiologist;Portia Rollene Rotunda, RN, BSN;Brittany Durene Fruits, BS, ACSM CEP, Exercise Physiologist    Virtual Visit  No    Medication changes reported      No    Fall or balance concerns reported     No    Tobacco Cessation  No Change    Warm-up and Cool-down  Performed on first and last piece of equipment    Resistance Training Performed  Yes    VAD Patient?  No    PAD/SET Patient?  No      Pain Assessment   Currently in Pain?  No/denies    Pain Score  0-No pain    Multiple Pain Sites  No       Capillary Blood Glucose: No results found for this or any previous visit (from the past 24 hour(s)).  Exercise Prescription Changes - 05/08/19 1100      Response to Exercise   Blood Pressure (Admit)  106/52    Blood Pressure (Exercise)  132/80    Blood Pressure (Exit)  114/64    Heart Rate (Admit)  97 bpm    Heart Rate (Exercise)  105 bpm    Heart Rate (Exit)  86 bpm    Rating of Perceived Exertion (Exercise)  12    Symptoms  None    Comments  Pt first day of exercise.     Duration  Continue with 30 min of aerobic exercise without signs/symptoms of physical distress.    Intensity  THRR unchanged      Progression   Progression  Continue to progress workloads to maintain intensity without signs/symptoms of physical distress.    Average  METs  2.1      Resistance Training   Training Prescription  Yes    Weight  2lbs    Reps  10-15    Time  10 Minutes      Interval Training   Interval Training  No      NuStep   Level  2    SPM  80    Minutes  15    METs  2.1      Arm Ergometer   Level  1.5    Watts  30    Minutes  15    METs  2       Social History   Tobacco Use  Smoking Status Former Smoker  . Packs/day: 1.00  . Years: 35.00  . Pack years: 35.00  . Types: Cigarettes  . Quit date: 06/16/1992  . Years since quitting: 26.9  Smokeless Tobacco Never Used    Goals Met:  Exercise tolerated well  Goals Unmet:  Not Applicable  Comments: Pt started cardiac rehab today.  Pt tolerated light exercise without difficulty. VSS, telemetry-SR, asymptomatic.  Medication list reconciled. Pt denies barriers to medicaiton compliance.  PSYCHOSOCIAL ASSESSMENT:  PHQ-1. Pt exhibits positive coping skills, hopeful outlook with supportive family. No psychosocial needs identified at this time, no psychosocial interventions necessary.  Pt oriented to exercise equipment and routine.    Understanding verbalized.   Dr. Fransico Him is Medical Director for Cardiac Rehab at [facility].

## 2019-05-09 DIAGNOSIS — J3089 Other allergic rhinitis: Secondary | ICD-10-CM | POA: Diagnosis not present

## 2019-05-09 DIAGNOSIS — J301 Allergic rhinitis due to pollen: Secondary | ICD-10-CM | POA: Diagnosis not present

## 2019-05-09 DIAGNOSIS — J3081 Allergic rhinitis due to animal (cat) (dog) hair and dander: Secondary | ICD-10-CM | POA: Diagnosis not present

## 2019-05-10 ENCOUNTER — Other Ambulatory Visit: Payer: Self-pay

## 2019-05-10 ENCOUNTER — Encounter (HOSPITAL_COMMUNITY)
Admission: RE | Admit: 2019-05-10 | Discharge: 2019-05-10 | Disposition: A | Discharge status: Home / Self Care | Payer: PPO | Source: Ambulatory Visit | Attending: Cardiology | Admitting: Cardiology

## 2019-05-10 DIAGNOSIS — Z955 Presence of coronary angioplasty implant and graft: Secondary | ICD-10-CM

## 2019-05-12 ENCOUNTER — Other Ambulatory Visit: Payer: Self-pay

## 2019-05-12 ENCOUNTER — Encounter (HOSPITAL_COMMUNITY)
Admission: RE | Admit: 2019-05-12 | Discharge: 2019-05-12 | Disposition: A | Discharge status: Home / Self Care | Payer: PPO | Source: Ambulatory Visit | Attending: Cardiology | Admitting: Cardiology

## 2019-05-12 DIAGNOSIS — Z955 Presence of coronary angioplasty implant and graft: Secondary | ICD-10-CM | POA: Diagnosis not present

## 2019-05-12 MED ORDER — CLOPIDOGREL BISULFATE 75 MG PO TABS: 75.0000 mg | ORAL_TABLET | Freq: Every day | ORAL | 3 refills | Status: DC

## 2019-05-15 ENCOUNTER — Other Ambulatory Visit: Payer: Self-pay

## 2019-05-15 ENCOUNTER — Encounter (HOSPITAL_COMMUNITY)
Admission: RE | Admit: 2019-05-15 | Discharge: 2019-05-15 | Disposition: A | Discharge status: Home / Self Care | Payer: PPO | Source: Ambulatory Visit | Attending: Cardiology | Admitting: Cardiology

## 2019-05-15 DIAGNOSIS — Z955 Presence of coronary angioplasty implant and graft: Secondary | ICD-10-CM

## 2019-05-15 NOTE — Progress Notes (Signed)
Joshua [name] 79 y.o. male Nutrition Note  Visit Diagnosis: Status post coronary artery stent placement   Past Medical History:  Diagnosis Date  . Acute medial meniscal tear   . Anemia   . Arthritis    "middle finger right hand; right knee; neck" (02/06/2014)  . Asthma   . 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 CAD (coronary artery disease)    CABG 2001  . Depression   . Diverticulosis   . DJD (degenerative joint disease)    BACK  . GERD (gastroesophageal reflux disease)   . H/O inguinal hernia repair 12/2018  . History of gout   . Hyperlipidemia   . Hypertension   . IBS (irritable bowel syndrome)   . Obesity   . OSA (obstructive sleep apnea)    USES CPAP   . Pancreatitis ~ 1980  . Prostate cancer ([place]) 04/2010     Medications reviewed.   Current Outpatient Medications:  .  acetaminophen (TYLENOL) 500 MG tablet, Take 1,000 mg by mouth every 6 (six) hours as needed for moderate pain or headache. , Disp: , Rfl:  .  albuterol (PROAIR HFA) 108 (90 BASE) MCG/ACT inhaler, Inhale 1-2 puffs into the lungs every 6 (six) hours as needed for wheezing or shortness of breath., Disp: 18 g, Rfl: 3 .  amLODipine (NORVASC) 5 MG tablet, Take 1 tablet (5 mg total) by mouth daily., Disp: 30 tablet, Rfl: 3 .  aspirin EC 81 MG tablet, Take 81 mg by mouth at bedtime. , Disp: , Rfl:  .  Cholecalciferol (VITAMIN D3) 50 MCG (2000 UT) TABS, Take 2,000 Units by mouth daily., Disp: , Rfl:  .  clopidogrel (PLAVIX) 75 MG tablet, Take 1 tablet (75 mg total) by mouth daily with breakfast., Disp: 90 tablet, Rfl: 3 .  diazepam (VALIUM) 5 MG tablet, Take 1 tablet (5 mg total) by mouth at bedtime as needed. (Patient taking differently: Take 5 mg by mouth at bedtime as needed (sleep). ), Disp: 30 tablet, Rfl: 1 .  EPINEPHrine 0.3 mg/0.3 mL IJ SOAJ injection, See admin instructions. for allergic reaction, Disp: , Rfl:  .  Evolocumab (REPATHA SURECLICK) [id number] MG/ML SOAJ, Inject 140  mg into the skin every 14 (fourteen) days., Disp: 6 pen, Rfl: 3 .  FLOVENT HFA 110 MCG/ACT inhaler, Inhale 1 puff into the lungs 2 (two) times daily. , Disp: , Rfl: 5 .  fluticasone (FLONASE) 50 MCG/ACT nasal spray, Place 1 spray into both nostrils 2 (two) times daily. , Disp: , Rfl:  .  folic acid (FOLVITE) [id number] MCG tablet, Take 800 mcg by mouth daily. , Disp: , Rfl:  .  gabapentin (NEURONTIN) 300 MG capsule, Take 1 cap in AM, 1 cap at noon, 2 caps at bedtime (Patient taking differently: Take 300-600 mg by mouth See admin instructions. Take 1 capsule (300 mg) by mouth in the morning, take 1 capsule (300 mg) by mouth with lunch, & take 2 capsules (600 mg) by mouth at bedtime.), Disp: 360 capsule, Rfl: 3 .  Glucosamine-Chondroitin (MOVE FREE PO), Take 1 tablet by mouth daily., Disp: , Rfl:  .  hydrochlorothiazide (HYDRODIURIL) 25 MG tablet, TAKE 1 TABLET BY MOUTH EVERY DAY (Patient taking differently: Take 25 mg by mouth daily with lunch. ), Disp: 90 tablet, Rfl: 0 .  hydrocortisone 2.5 % ointment, , Disp: , Rfl:  .  ketoconazole (NIZORAL) 2 % cream, Apply 1 application topically 2 (two) times daily as needed for irritation. ,  Disp: , Rfl:  .  loratadine (CLARITIN) 10 MG tablet, Take 10 mg by mouth at bedtime. , Disp: , Rfl:  .  losartan (COZAAR) 100 MG tablet, TAKE 1 TABLET BY MOUTH EVERY DAY (Patient taking differently: Take 100 mg by mouth at bedtime. ), Disp: 90 tablet, Rfl: 3 .  meclizine (ANTIVERT) 25 MG tablet, Take 1 tablet (25 mg total) by mouth 3 (three) times daily as needed for dizziness., Disp: 30 tablet, Rfl: 0 .  Miconazole Nitrate (LOTRIMIN AF DEODORANT POWDER) 2 % AERP, Apply 1 spray topically 2 (two) times daily., Disp: , Rfl:  .  Misc Natural Products (TART CHERRY ADVANCED PO), Take 1 tablet by mouth daily., Disp: , Rfl:  .  ondansetron (ZOFRAN ODT) 8 MG disintegrating tablet, Take 1 tablet (8 mg total) by mouth every 8 (eight) hours as needed for nausea or vomiting., Disp: 20 tablet,  Rfl: 0 .  pantoprazole (PROTONIX) 40 MG tablet, TAKE 1 TABLET EVERY DAY, Disp: 30 tablet, Rfl: 4 .  polyethylene glycol powder (GLYCOLAX/MIRALAX) powder, Take 17 g by mouth daily. (Patient taking differently: Take 17 g by mouth 2 (two) times daily. ), Disp: 255 g, Rfl: 0 .  Probiotic Product (ALIGN PO), Take 7.5 g by mouth daily., Disp: , Rfl:  .  pyridOXINE (VITAMIN B-6) 100 MG tablet, Take 100 mg by mouth daily with supper., Disp: , Rfl:  .  traMADol (ULTRAM) 50 MG tablet, Take 1 tablet (50 mg total) by mouth every 6 (six) hours as needed., Disp: 20 tablet, Rfl: 1 .  vitamin B-12 (CYANOCOBALAMIN) 1000 MCG tablet, Take 1,000 mcg by mouth daily., Disp: , Rfl:    Ht Readings from Last 1 Encounters:  05/04/19 5' 5.75" (1.67 m)     Wt Readings from Last 3 Encounters:  05/04/19 216 lb 4.3 oz (98.1 kg)  05/04/19 215 lb (97.5 kg)  04/21/19 216 lb 12.8 oz (98.3 kg)     There is no height or weight on file to calculate BMI.   Social History   Tobacco Use  Smoking Status Former Smoker  . Packs/day: 1.00  . Years: 35.00  . Pack years: 35.00  . Types: Cigarettes  . Quit date: 06/16/1992  . Years since quitting: 26.9  Smokeless Tobacco Never Used     Lab Results  Component Value Date   CHOL 96 11/03/2018   Lab Results  Component Value Date   HDL 46.20 11/03/2018   Lab Results  Component Value Date   LDLCALC 18 11/03/2018   Lab Results  Component Value Date   TRIG 158.0 (H) 11/03/2018   Lab Results  Component Value Date   CHOLHDL 2 11/03/2018     Lab Results  Component Value Date   HGBA1C 5.8 11/03/2018     CBG (last 3)  No results for input(s): GLUCAP in the last 72 hours.   Nutrition Note  Spoke with pt. Nutrition Plan and Nutrition Survey goals reviewed with pt. Pt has been to a RD about 3 years ago to discuss general healthy eating. He has adverse reaction to many vegetables. We discussed increasing portion sizes of the colorful foods he does like such as  salads, low sodium tomato sauce. Will continue discussion and counseling on incorporating nutrient dense foods as [name] feels comfortable.  Pt has Pre-diabetes. Last A1c indicates blood glucose well-controlled.  [name] has eliminate sugary beverages and decreased desserts while also limiting portions of carbs including breads.   His goals for cardiac rehab are  to form an exercise routine and lose about 10 lbs.   Pt expressed understanding of the information reviewed.   Nutrition Diagnosis Food-and nutrition-related knowledge deficit related to lack of exposure to information as related to diagnosis of: ? CVD ? Pre-diabetes  Nutrition Intervention ? Pt's individual nutrition plan reviewed with pt. ? Benefits of adopting Heart Healthy diet discussed when Medficts reviewed.   ? Continue client-centered nutrition education by RD, as part of interdisciplinary care.  Goal(s) ? Pt to build a healthy plate including vegetables, fruits, whole grains, and low-fat dairy products in a heart healthy meal plan. ? Pt able to name foods that affect blood glucose  Plan:   Will provide client-centered nutrition education as part of interdisciplinary care  Monitor and evaluate progress toward nutrition goal with team.   Michaele Offer, MS, RDN, LDN

## 2019-05-17 ENCOUNTER — Other Ambulatory Visit: Payer: Self-pay

## 2019-05-17 ENCOUNTER — Encounter (HOSPITAL_COMMUNITY)
Admission: RE | Admit: 2019-05-17 | Discharge: 2019-05-17 | Disposition: A | Discharge status: Home / Self Care | Payer: PPO | Source: Ambulatory Visit | Attending: Cardiology | Admitting: Cardiology

## 2019-05-17 DIAGNOSIS — Z955 Presence of coronary angioplasty implant and graft: Secondary | ICD-10-CM

## 2019-05-18 DIAGNOSIS — L570 Actinic keratosis: Secondary | ICD-10-CM | POA: Diagnosis not present

## 2019-05-18 DIAGNOSIS — L821 Other seborrheic keratosis: Secondary | ICD-10-CM | POA: Diagnosis not present

## 2019-05-18 DIAGNOSIS — D692 Other nonthrombocytopenic purpura: Secondary | ICD-10-CM | POA: Diagnosis not present

## 2019-05-19 ENCOUNTER — Other Ambulatory Visit: Payer: Self-pay

## 2019-05-19 ENCOUNTER — Encounter (HOSPITAL_COMMUNITY)
Admission: RE | Admit: 2019-05-19 | Discharge: 2019-05-19 | Disposition: A | Discharge status: Home / Self Care | Payer: PPO | Source: Ambulatory Visit | Attending: Cardiology | Admitting: Cardiology

## 2019-05-19 DIAGNOSIS — Z955 Presence of coronary angioplasty implant and graft: Secondary | ICD-10-CM | POA: Diagnosis not present

## 2019-05-22 ENCOUNTER — Encounter (HOSPITAL_COMMUNITY)
Admission: RE | Admit: 2019-05-22 | Discharge: 2019-05-22 | Disposition: A | Discharge status: Home / Self Care | Payer: PPO | Source: Ambulatory Visit | Attending: Cardiology | Admitting: Cardiology

## 2019-05-22 ENCOUNTER — Other Ambulatory Visit: Payer: Self-pay

## 2019-05-22 DIAGNOSIS — Z955 Presence of coronary angioplasty implant and graft: Secondary | ICD-10-CM | POA: Insufficient documentation

## 2019-05-23 ENCOUNTER — Ambulatory Visit: Payer: PPO | Attending: Internal Medicine

## 2019-05-23 DIAGNOSIS — J301 Allergic rhinitis due to pollen: Secondary | ICD-10-CM | POA: Diagnosis not present

## 2019-05-23 DIAGNOSIS — Z23 Encounter for immunization: Secondary | ICD-10-CM | POA: Insufficient documentation

## 2019-05-23 DIAGNOSIS — J3089 Other allergic rhinitis: Secondary | ICD-10-CM | POA: Diagnosis not present

## 2019-05-23 DIAGNOSIS — J3081 Allergic rhinitis due to animal (cat) (dog) hair and dander: Secondary | ICD-10-CM | POA: Diagnosis not present

## 2019-05-23 NOTE — Progress Notes (Signed)
   Covid-19 Vaccination Clinic  Name:  Joshua [name]    MRN: [id number] DOB: [birth year]  05/23/2019  Joshua [name] was observed post Covid-19 immunization for 15 minutes without incident. He was provided with Vaccine Information Sheet and instruction to access the V-Safe system.   Joshua [name] was instructed to call 911 with any severe reactions post vaccine: [name] Difficulty breathing  . Swelling of face and throat  . A fast heartbeat  . A bad rash all over body  . Dizziness and weakness   Immunizations Administered    Name Date Dose VIS Date Route   Pfizer COVID-19 Vaccine 05/23/2019  4:28 PM 0.3 mL 03/03/2019 Intramuscular   Manufacturer: [place]   Lot: [id number]   [place]: [id number]

## 2019-05-24 ENCOUNTER — Encounter (HOSPITAL_COMMUNITY): Payer: PPO

## 2019-05-25 DIAGNOSIS — G4733 Obstructive sleep apnea (adult) (pediatric): Secondary | ICD-10-CM | POA: Diagnosis not present

## 2019-05-26 ENCOUNTER — Other Ambulatory Visit: Payer: Self-pay

## 2019-05-26 ENCOUNTER — Encounter (HOSPITAL_COMMUNITY)
Admission: RE | Admit: 2019-05-26 | Discharge: 2019-05-26 | Disposition: A | Discharge status: Home / Self Care | Payer: PPO | Source: Ambulatory Visit | Attending: Cardiology | Admitting: Cardiology

## 2019-05-26 DIAGNOSIS — Z955 Presence of coronary angioplasty implant and graft: Secondary | ICD-10-CM | POA: Diagnosis not present

## 2019-05-29 ENCOUNTER — Other Ambulatory Visit: Payer: Self-pay

## 2019-05-29 ENCOUNTER — Encounter (HOSPITAL_COMMUNITY)
Admission: RE | Admit: 2019-05-29 | Discharge: 2019-05-29 | Disposition: A | Discharge status: Home / Self Care | Payer: PPO | Source: Ambulatory Visit | Attending: Cardiology | Admitting: Cardiology

## 2019-05-29 DIAGNOSIS — Z955 Presence of coronary angioplasty implant and graft: Secondary | ICD-10-CM

## 2019-05-29 NOTE — Progress Notes (Signed)
I have reviewed a Home Exercise Prescription with [name] . Laman is currently exercising at home.  The patient was advised to walk 2-4 days a week for 30-45 minutes.  Doren Custard and I discussed how to progress their exercise prescription.  The patient stated that their goals were to continue walking at home for 35 minutes 2-3 days per week in addition to CR program.  The patient stated that they understand the exercise prescription.  We reviewed exercise guidelines, target heart rate during exercise, RPE Scale, weather conditions, NTG use, endpoints for exercise, warmup and cool down.  Patient is encouraged to come to me with any questions. I will continue to follow up with the patient to assist them with progression and safety.    Daryll Brod BS, ACSM CEP 05/29/2019 11:28 AM

## 2019-05-30 DIAGNOSIS — J301 Allergic rhinitis due to pollen: Secondary | ICD-10-CM | POA: Diagnosis not present

## 2019-05-30 DIAGNOSIS — J3089 Other allergic rhinitis: Secondary | ICD-10-CM | POA: Diagnosis not present

## 2019-05-30 DIAGNOSIS — J3081 Allergic rhinitis due to animal (cat) (dog) hair and dander: Secondary | ICD-10-CM | POA: Diagnosis not present

## 2019-05-31 ENCOUNTER — Encounter (HOSPITAL_COMMUNITY): Payer: PPO

## 2019-05-31 ENCOUNTER — Telehealth: Payer: Self-pay | Admitting: Pulmonary Disease

## 2019-05-31 NOTE — Telephone Encounter (Signed)
Called and spoke with Patient. Patient stated he received a message from insurance about cpap authorization.   Patient received cpap end of 2020,and has not had a cpap follow up.  Explained cpap compliance and OV.  Understanding stated.  My chart visit scheduled 06/02/19, at 1100, with Brian,NP.  Nothing further at this time.

## 2019-06-01 ENCOUNTER — Telehealth: Payer: Self-pay | Admitting: Pulmonary Disease

## 2019-06-01 NOTE — Progress Notes (Signed)
Virtual Visit via Video Note  I connected with Joshua [name] on 06/02/19 at 11:00 AM EST by a video enabled telemedicine application and verified that I am speaking with the correct person using two identifiers.  Location: Patient: Home Provider: Office - [facility] - [id number], [address], [place], [place] [address]  I discussed the limitations of evaluation and management by telemedicine and the availability of in person appointments. The patient expressed understanding and agreed to proceed. I also discussed with the patient that there may be a patient responsible charge related to this service. The patient expressed understanding and agreed to proceed.  Patient consented to consult via telephone: Yes People present and their role in pt care: Pt   History of Present Illness:  79 year old male former smoker followed in our office for mild obstructive sleep apnea and asthma  Past medical history: Hyperlipidemia, anemia, hypertension, GERD, restless leg, chronic kidney disease, fatigue, degenerative joint disease Smoking history: Never smoker Maintenance: Flovent 110 Patient of Dr. Halford Chessman   Chief complaint: CPAP follow up   79 year old male never smoker followed in our office for mild obstructive sleep apnea.  Patient completing follow-up telephonically with our office today.  Patient using CPAP and benefiting from it. CPAP compliance shows excellence compliance:  05/02/2019-05/31/2019-30 on the last 30 days use, all 30 those days greater than 4 hours, average usage 7 hours, APAP setting 6-12, 95th percentile 11.1, AHI 1.9  Patient reports that he continues to use Flovent as managed by Dr. Remus Blake.  He had to use his rescue inhaler 1 time this year.  He reports one episode of shortness of breath which is when he uses rescue inhaler he has not had any other events since then.  Patient is having difficulties with his [place] reporting that he has a bill of over $200  and he is unsure why.  We can investigate this today.   Observations/Objective:  Sleep tests:  PSG 12/25/13 >> AHI 13  Cardiac tests:  Echo 05/10/12 >> EF 60 to 65%, mild LVH  Social History   Tobacco Use  Smoking Status Former Smoker  . Packs/day: 1.00  . Years: 35.00  . Pack years: 35.00  . Types: Cigarettes  . Quit date: 06/16/1992  . Years since quitting: 26.9  Smokeless Tobacco Never Used   Immunization History  Administered Date(s) Administered  . Fluad Quad(high Dose 65+) 11/21/2018  . Influenza Split 01/08/2012, 11/12/2014  . Influenza Whole 12/08/2007, 12/26/2008, 12/22/2010  . Influenza, High Dose Seasonal PF 11/16/2015, 01/01/2017  . Influenza,inj,Quad PF,6+ Mos 11/30/2012  . Influenza-Unspecified 01/12/2014, 12/21/2017  . PFIZER SARS-COV-2 Vaccination 04/28/2019, 05/23/2019  . Pneumococcal Conjugate-13 05/31/2014  . Pneumococcal Polysaccharide-23 06/04/2006, 07/15/2012  . Td 04/23/2005  . Tdap 02/18/2016  . Zoster 01/08/2012  . Zoster Recombinat (Shingrix) 01/31/2018, 04/02/2018    Assessment and Plan:  Asthma Follows with Dr. Remus Blake   Plan:  Continue Allergy Asthma Continue Flovent  Continue rescue inhaler   OSA (obstructive sleep apnea) Plan:  Continue to wear CPAP  Refill supplies / mask  I personally called DME [employer] - 43 dollars recently paid, paid up per [employer] - account 0.00 per Melissa with [employer]     Follow Up Instructions:  Return in about 1 year (around 06/01/2020), or if symptoms worsen or fail to improve, for Follow up with Dr. Halford Chessman, Follow up with Wyn Quaker FNP-C.    I discussed the assessment and treatment plan with the patient. The patient was provided  an opportunity to ask questions and all were answered. The patient agreed with the plan and demonstrated an understanding of the instructions.   The patient was advised to call back or seek an in-person evaluation if the symptoms worsen or if the condition fails to improve  as anticipated.  I provided 25 minutes of non-face-to-face time during this encounter.   Joshua Rinne, NP

## 2019-06-01 NOTE — Progress Notes (Signed)
Cardiac Individual Treatment Plan  Patient Details  Name: Joshua [name] MRN: [id number] Date of Birth: 06-Sep-1940 Referring Provider:     [place] from 05/04/2019 in [place]  Referring Provider  Dr. [name] MD      Initial Encounter Date:    CARDIAC REHAB [facility] from 05/04/2019 in [place]  Date  05/04/19      Visit Diagnosis: Status post coronary artery stent placement  Patient's Home Medications on Admission:  Current Outpatient Medications:  .  acetaminophen (TYLENOL) 500 MG tablet, Take 1,000 mg by mouth every 6 (six) hours as needed for moderate pain or headache. , Disp: , Rfl:  .  albuterol (PROAIR HFA) 108 (90 BASE) MCG/ACT inhaler, Inhale 1-2 puffs into the lungs every 6 (six) hours as needed for wheezing or shortness of breath., Disp: 18 g, Rfl: 3 .  amLODipine (NORVASC) 5 MG tablet, Take 1 tablet (5 mg total) by mouth daily., Disp: 30 tablet, Rfl: 3 .  aspirin EC 81 MG tablet, Take 81 mg by mouth at bedtime. , Disp: , Rfl:  .  Cholecalciferol (VITAMIN D3) 50 MCG (2000 UT) TABS, Take 2,000 Units by mouth daily., Disp: , Rfl:  .  clopidogrel (PLAVIX) 75 MG tablet, Take 1 tablet (75 mg total) by mouth daily with breakfast., Disp: 90 tablet, Rfl: 3 .  diazepam (VALIUM) 5 MG tablet, Take 1 tablet (5 mg total) by mouth at bedtime as needed. (Patient taking differently: Take 5 mg by mouth at bedtime as needed (sleep). ), Disp: 30 tablet, Rfl: 1 .  EPINEPHrine 0.3 mg/0.3 mL IJ SOAJ injection, See admin instructions. for allergic reaction, Disp: , Rfl:  .  Evolocumab (REPATHA SURECLICK) [id number] MG/ML SOAJ, Inject 140 mg into the skin every 14 (fourteen) days., Disp: 6 pen, Rfl: 3 .  FLOVENT HFA 110 MCG/ACT inhaler, Inhale 1 puff into the lungs 2 (two) times daily. , Disp: , Rfl: 5 .  fluticasone (FLONASE) 50 MCG/ACT nasal spray, Place 1 spray into both nostrils 2 (two)  times daily. , Disp: , Rfl:  .  folic acid (FOLVITE) [id number] MCG tablet, Take 800 mcg by mouth daily. , Disp: , Rfl:  .  gabapentin (NEURONTIN) 300 MG capsule, Take 1 cap in AM, 1 cap at noon, 2 caps at bedtime (Patient taking differently: Take 300-600 mg by mouth See admin instructions. Take 1 capsule (300 mg) by mouth in the morning, take 1 capsule (300 mg) by mouth with lunch, & take 2 capsules (600 mg) by mouth at bedtime.), Disp: 360 capsule, Rfl: 3 .  Glucosamine-Chondroitin (MOVE FREE PO), Take 1 tablet by mouth daily., Disp: , Rfl:  .  hydrochlorothiazide (HYDRODIURIL) 25 MG tablet, TAKE 1 TABLET BY MOUTH EVERY DAY (Patient taking differently: Take 25 mg by mouth daily with lunch. ), Disp: 90 tablet, Rfl: 0 .  hydrocortisone 2.5 % ointment, , Disp: , Rfl:  .  ketoconazole (NIZORAL) 2 % cream, Apply 1 application topically 2 (two) times daily as needed for irritation. , Disp: , Rfl:  .  loratadine (CLARITIN) 10 MG tablet, Take 10 mg by mouth at bedtime. , Disp: , Rfl:  .  losartan (COZAAR) 100 MG tablet, TAKE 1 TABLET BY MOUTH EVERY DAY (Patient taking differently: Take 100 mg by mouth at bedtime. ), Disp: 90 tablet, Rfl: 3 .  meclizine (ANTIVERT) 25 MG tablet, Take 1 tablet (25 mg total) by mouth 3 (  three) times daily as needed for dizziness., Disp: 30 tablet, Rfl: 0 .  Miconazole Nitrate (LOTRIMIN AF DEODORANT POWDER) 2 % AERP, Apply 1 spray topically 2 (two) times daily., Disp: , Rfl:  .  Misc Natural Products (TART CHERRY ADVANCED PO), Take 1 tablet by mouth daily., Disp: , Rfl:  .  ondansetron (ZOFRAN ODT) 8 MG disintegrating tablet, Take 1 tablet (8 mg total) by mouth every 8 (eight) hours as needed for nausea or vomiting., Disp: 20 tablet, Rfl: 0 .  pantoprazole (PROTONIX) 40 MG tablet, TAKE 1 TABLET EVERY DAY, Disp: 30 tablet, Rfl: 4 .  polyethylene glycol powder (GLYCOLAX/MIRALAX) powder, Take 17 g by mouth daily. (Patient taking differently: Take 17 g by mouth 2 (two) times daily. ), Disp:  255 g, Rfl: 0 .  Probiotic Product (ALIGN PO), Take 7.5 g by mouth daily., Disp: , Rfl:  .  pyridOXINE (VITAMIN B-6) 100 MG tablet, Take 100 mg by mouth daily with supper., Disp: , Rfl:  .  traMADol (ULTRAM) 50 MG tablet, Take 1 tablet (50 mg total) by mouth every 6 (six) hours as needed., Disp: 20 tablet, Rfl: 1 .  vitamin B-12 (CYANOCOBALAMIN) 1000 MCG tablet, Take 1,000 mcg by mouth daily., Disp: , Rfl:   Past Medical History: Past Medical History:  Diagnosis Date  . Acute medial meniscal tear   . Anemia   . Arthritis    "middle finger right hand; right knee; neck" (02/06/2014)  . Asthma   . Bladder cancer ([place]) 04/2010   "cauterized during prostate OR"  . CAD (coronary artery disease)    CABG 2001  . Depression   . Diverticulosis   . DJD (degenerative joint disease)    BACK  . GERD (gastroesophageal reflux disease)   . H/O inguinal hernia repair 12/2018  . History of gout   . Hyperlipidemia   . Hypertension   . IBS (irritable bowel syndrome)   . Obesity   . OSA (obstructive sleep apnea)    USES CPAP   . Pancreatitis ~ 1980  . Prostate cancer ([place]) 04/2010    Tobacco Use: Social History   Tobacco Use  Smoking Status Former Smoker  . Packs/day: 1.00  . Years: 35.00  . Pack years: 35.00  . Types: Cigarettes  . Quit date: 06/16/1992  . Years since quitting: 26.9  Smokeless Tobacco Never Used    Labs: Recent Review Flowsheet Data    Labs for ITP Cardiac and Pulmonary Rehab Latest Ref Rng & Units 12/30/2016 10/01/2017 01/27/2018 05/05/2018 11/03/2018   Cholestrol 0 - 200 mg/dL 81(L) 94 95 - 96   LDLCALC 0 - 99 mg/dL 9 16 16  - 18   LDLDIRECT mg/dL - - - - -   HDL >39.00 mg/dL 42 44 49 - 46.20   Trlycerides 0.0 - 149.0 mg/dL 151(H) 322(H) 149 - 158.0(H)   Hemoglobin A1c 4.6 - 6.5 % - 5.8(H) - 5.7 5.8      Capillary Blood Glucose: No results found for: GLUCAP   Exercise Target Goals: Exercise Program Goal: Individual exercise prescription set using results from  initial 6 min walk test and THRR while considering  patient's activity barriers and safety.   Exercise Prescription Goal: Initial exercise prescription builds to 30-45 minutes a day of aerobic activity, 2-3 days per week.  Home exercise guidelines will be given to patient during program as part of exercise prescription that the participant will acknowledge.  Activity Barriers & Risk Stratification: Activity Barriers & Cardiac Risk Stratification -  05/04/19 1226      Activity Barriers & Cardiac Risk Stratification   Activity Barriers  Back Problems;Deconditioning;Other (comment);Arthritis    Comments  bilateral foot neuropathy    Cardiac Risk Stratification  Moderate       6 Minute Walk: 6 Minute Walk    Row Name 05/04/19 1151         6 Minute Walk   Phase  Initial     Distance  1400 feet     Distance % Change  0 %     Distance Feet Change  0 ft     Walk Time  4.58 minutes     # of Rest Breaks  1     MPH  2.65     METS  2.63     RPE  11     Perceived Dyspnea   0     VO2 Peak  9.2     Symptoms  Yes (comment)     Comments  Pt reported 4/10 Chest Pain at 4 minutes and 58 seconds. Test stopped. CP resolved at minute 7. Pt denied feeling SOB or dizzy.     Resting HR  90 bpm     Resting BP  126/70     Exercise Oxygen Saturation  during 6 min walk  96 %     Max Ex. HR  111 bpm     Max Ex. BP  158/80     2 Minute Post BP  136/82        Oxygen Initial Assessment:   Oxygen Re-Evaluation:   Oxygen Discharge (Final Oxygen Re-Evaluation):   Initial Exercise Prescription: Initial Exercise Prescription - 05/04/19 1300      Date of Initial Exercise RX and Referring Provider   Date  05/04/19    Referring Provider  Dr. [name] MD    Expected Discharge Date  06/30/19      NuStep   Level  1    SPM  80    Minutes  15    METs  2.2      Arm Ergometer   Level  1.5    Watts  30    Minutes  15    METs  2      Prescription Details   Frequency (times per week)  3x     Duration  Progress to 10 minutes continuous walking  at current work load and total walking time to 30-45 min      Intensity   THRR 40-80% of Max Heartrate  58-115    Ratings of Perceived Exertion  11-13    Perceived Dyspnea  0-4      Progression   Progression  Continue progressive overload as per policy without signs/symptoms or physical distress.      Resistance Training   Training Prescription  Yes    Weight  2lbs    Reps  10-15       Perform Capillary Blood Glucose checks as needed.  Exercise Prescription Changes: Exercise Prescription Changes    Row Name 05/08/19 1100 05/29/19 1100           Response to Exercise   Blood Pressure (Admit)  106/52  110/54      Blood Pressure (Exercise)  132/80  158/72      Blood Pressure (Exit)  114/64  102/60      Heart Rate (Admit)  97 bpm  98 bpm      Heart Rate (Exercise)  105 bpm  113 bpm      Heart Rate (Exit)  86 bpm  90 bpm      Rating of Perceived Exertion (Exercise)  12  12      Symptoms  None  None      Comments  Pt first day of exercise.   --      Duration  Continue with 30 min of aerobic exercise without signs/symptoms of physical distress.  Continue with 30 min of aerobic exercise without signs/symptoms of physical distress.      Intensity  THRR unchanged  THRR unchanged        Progression   Progression  Continue to progress workloads to maintain intensity without signs/symptoms of physical distress.  Continue to progress workloads to maintain intensity without signs/symptoms of physical distress.      Average METs  2.1  2.6        Resistance Training   Training Prescription  Yes  Yes      Weight  2lbs  8 lbs.       Reps  10-15  10-15      Time  10 Minutes  10 Minutes        Interval Training   Interval Training  No  No        NuStep   Level  2  5      SPM  80  85      Minutes  15  15      METs  2.1  2.5        Arm Ergometer   Level  1.5  3      Watts  30  30      Minutes  15  15      METs  2  2         Home Exercise Plan   Plans to continue exercise at  --  Home (comment)      Frequency  --  Add 3 additional days to program exercise sessions.      Initial Home Exercises Provided  --  05/29/19         Exercise Comments: Exercise Comments    Row Name 05/08/19 1128 05/29/19 1131         Exercise Comments  Pt first day of exercise. Pt tolerated exercise Rx well.  Reviewed HEP with Pt. Pt understands goals for exercising at home.         Exercise Goals and Review: Exercise Goals    Row Name 05/04/19 1227             Exercise Goals   Increase Physical Activity  Yes       Intervention  Provide advice, education, support and counseling about physical activity/exercise needs.;Develop an individualized exercise prescription for aerobic and resistive training based on initial evaluation findings, risk stratification, comorbidities and participant's personal goals.       Expected Outcomes  Short Term: Attend rehab on a regular basis to increase amount of physical activity.;Long Term: Add in home exercise to make exercise part of routine and to increase amount of physical activity.;Long Term: Exercising regularly at least 3-5 days a week.       Increase Strength and Stamina  Yes       Intervention  Provide advice, education, support and counseling about physical activity/exercise needs.;Develop an individualized exercise prescription for aerobic and resistive training based on initial evaluation findings, risk stratification, comorbidities and participant's personal goals.  Expected Outcomes  Short Term: Increase workloads from initial exercise prescription for resistance, speed, and METs.;Short Term: Perform resistance training exercises routinely during rehab and add in resistance training at home;Long Term: Improve cardiorespiratory fitness, muscular endurance and strength as measured by increased METs and functional capacity (6MWT)       Able to understand and use rate of perceived  exertion (RPE) scale  Yes       Intervention  Provide education and explanation on how to use RPE scale       Expected Outcomes  Short Term: Able to use RPE daily in rehab to express subjective intensity level;Long Term:  Able to use RPE to guide intensity level when exercising independently       Knowledge and understanding of Target Heart Rate Range (THRR)  Yes       Intervention  Provide education and explanation of THRR including how the numbers were predicted and where they are located for reference       Expected Outcomes  Short Term: Able to state/look up THRR;Long Term: Able to use THRR to govern intensity when exercising independently;Short Term: Able to use daily as guideline for intensity in rehab       Able to check pulse independently  Yes       Intervention  Provide education and demonstration on how to check pulse in carotid and radial arteries.;Review the importance of being able to check your own pulse for safety during independent exercise       Expected Outcomes  Short Term: Able to explain why pulse checking is important during independent exercise;Long Term: Able to check pulse independently and accurately       Understanding of Exercise Prescription  Yes       Intervention  Provide education, explanation, and written materials on patient's individual exercise prescription       Expected Outcomes  Short Term: Able to explain program exercise prescription;Long Term: Able to explain home exercise prescription to exercise independently          Exercise Goals Re-Evaluation : Exercise Goals Re-Evaluation    Row Name 05/08/19 1126 05/29/19 1129           Exercise Goal Re-Evaluation   Exercise Goals Review  Increase Physical Activity;Increase Strength and Stamina;Able to understand and use rate of perceived exertion (RPE) scale;Knowledge and understanding of Target Heart Rate Range (THRR);Able to check pulse independently;Understanding of Exercise Prescription  Increase Physical  Activity;Increase Strength and Stamina;Able to understand and use rate of perceived exertion (RPE) scale;Knowledge and understanding of Target Heart Rate Range (THRR);Able to check pulse independently;Understanding of Exercise Prescription      Comments  Pt first day of exercise in [facility] program. Pt tolerated exercise well and denied having CP at any time during exercise. Pt understands THRR, RPE scale, and exercise Rx.  Reviewed HEP with Pt. Pt understands home exercise guidelines, PRE scale, THRR, weather precautions, and end points of exercise. Pt has stretching and free weight guided materials. Pt stated he will continue walking 2-3 days per week for 35 minutes.      Expected Outcomes  Will continue to monitor and progress Pt as tolerated.  Will continue to monitor and progress Pt as tolerated.         Discharge Exercise Prescription (Final Exercise Prescription Changes): Exercise Prescription Changes - 05/29/19 1100      Response to Exercise   Blood Pressure (Admit)  110/54    Blood Pressure (Exercise)  158/72  Blood Pressure (Exit)  102/60    Heart Rate (Admit)  98 bpm    Heart Rate (Exercise)  113 bpm    Heart Rate (Exit)  90 bpm    Rating of Perceived Exertion (Exercise)  12    Symptoms  None    Duration  Continue with 30 min of aerobic exercise without signs/symptoms of physical distress.    Intensity  THRR unchanged      Progression   Progression  Continue to progress workloads to maintain intensity without signs/symptoms of physical distress.    Average METs  2.6      Resistance Training   Training Prescription  Yes    Weight  8 lbs.     Reps  10-15    Time  10 Minutes      Interval Training   Interval Training  No      NuStep   Level  5    SPM  85    Minutes  15    METs  2.5      Arm Ergometer   Level  3    Watts  30    Minutes  15    METs  2      Home Exercise Plan   Plans to continue exercise at  Home (comment)    Frequency  Add 3 additional days to  program exercise sessions.    Initial Home Exercises Provided  05/29/19       Nutrition:  Target Goals: Understanding of nutrition guidelines, daily intake of sodium 1500mg , cholesterol 200mg , calories 30% from fat and 7% or less from saturated fats, daily to have 5 or more servings of fruits and vegetables.  Biometrics: Pre Biometrics - 05/04/19 1318      Pre Biometrics   Height  5' 5.75" (1.67 m)        Nutrition Therapy Plan and Nutrition Goals: Nutrition Therapy & Goals - 05/15/19 1009      Nutrition Therapy   Diet  Heart Healthy      Personal Nutrition Goals   Nutrition Goal  Pt to build a healthy plate including vegetables, fruits, whole grains, and low-fat dairy products in a heart healthy meal plan.    Personal Goal #2  Pt able to name foods that affect blood glucose      Intervention Plan   Intervention  Prescribe, educate and counsel regarding individualized specific dietary modifications aiming towards targeted core components such as weight, hypertension, lipid management, diabetes, heart failure and other comorbidities.;Nutrition handout(s) given to patient.    Expected Outcomes  Short Term Goal: A plan has been developed with personal nutrition goals set during dietitian appointment.;Long Term Goal: Adherence to prescribed nutrition plan.       Nutrition Assessments: Nutrition Assessments - 05/15/19 1005      MEDFICTS Scores   Pre Score  66       Nutrition Goals Re-Evaluation: Nutrition Goals Re-Evaluation    [place] Name 05/15/19 1010             Goals   Current Weight  216 lb (98 kg)       Nutrition Goal  Pt to build a healthy plate including vegetables, fruits, whole grains, and low-fat dairy products in a heart healthy meal plan.         Personal Goal #2 Re-Evaluation   Personal Goal #2  Pt able to name foods that affect blood glucose          Nutrition  Goals Re-Evaluation: Nutrition Goals Re-Evaluation    [place] Name 05/15/19 1010              Goals   Current Weight  216 lb (98 kg)       Nutrition Goal  Pt to build a healthy plate including vegetables, fruits, whole grains, and low-fat dairy products in a heart healthy meal plan.         Personal Goal #2 Re-Evaluation   Personal Goal #2  Pt able to name foods that affect blood glucose          Nutrition Goals Discharge (Final Nutrition Goals Re-Evaluation): Nutrition Goals Re-Evaluation - 05/15/19 1010      Goals   Current Weight  216 lb (98 kg)    Nutrition Goal  Pt to build a healthy plate including vegetables, fruits, whole grains, and low-fat dairy products in a heart healthy meal plan.      Personal Goal #2 Re-Evaluation   Personal Goal #2  Pt able to name foods that affect blood glucose       Psychosocial: Target Goals: Acknowledge presence or absence of significant depression and/or stress, maximize coping skills, provide positive support system. Participant is able to verbalize types and ability to use techniques and skills needed for reducing stress and depression.  Initial Review & Psychosocial Screening: Initial Psych Review & Screening - 05/04/19 1146      Initial Review   Current issues with  None Identified      Family Dynamics   Good Support System?  Yes    Comments  Mr. Surette has a positive outlook and attitude. He denies psychosocial barriers to self health management and participation in cardiac rehab. He has a very small support system that includes his spouse. They do not have children. 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      Barriers   Psychosocial barriers to participate in program  There are no identifiable barriers or psychosocial needs.      Screening Interventions   Interventions  Encouraged to exercise       Quality of Life Scores: Quality of Life - 05/10/19 1304      Quality of Life   Select  Quality of Life      Quality of Life Scores   Health/Function Pre  17.9 %     Socioeconomic Pre  21.29 %    Psych/Spiritual Pre  20.64 %    Family Pre  25.5 %    GLOBAL Pre  20.12 %      Scores of 19 and below usually indicate a poorer quality of life in these areas.  A difference of  2-3 points is a clinically meaningful difference.  A difference of 2-3 points in the total score of the Quality of Life Index has been associated with significant improvement in overall quality of life, self-image, physical symptoms, and general health in studies assessing change in quality of life.  PHQ-9: Recent Review Flowsheet Data    Depression screen [facility] 2/9 05/04/2019 02/06/2019 11/02/2018 05/05/2018 02/02/2018   Decreased Interest 0 0 0 0 0   Down, Depressed, Hopeless 1 1 1  0 1   PHQ - 2 Score 1 1 1  0 1   Altered sleeping - 0 1 0 1   Tired, decreased energy - 1 2 0 1   Change in appetite - 0 2 0 2   Feeling bad or failure about yourself  - 0 0  0 1   Trouble concentrating - 0 0 0 0   Moving slowly or fidgety/restless - 0 0 0 1   Suicidal thoughts - 0 0 0 0   PHQ-9 Score - 2 6 0 7   Difficult doing work/chores - Not difficult at all Not difficult at all Not difficult at all Not difficult at all     Interpretation of Total Score  Total Score Depression Severity:  1-4 = Minimal depression, 5-9 = Mild depression, 10-14 = Moderate depression, 15-19 = Moderately severe depression, 20-27 = Severe depression   Psychosocial Evaluation and Intervention: Psychosocial Evaluation - 05/08/19 1049      Psychosocial Evaluation & Interventions   Interventions  Encouraged to exercise with the program and follow exercise prescription    Comments  [name] denies any psychosocial interventions. [name] enjoys Radio producer and watching movies.    Expected Outcomes  [name] will maintain a positive outlook with good coping skills.    Continue Psychosocial Services   No Follow up required       Psychosocial Re-Evaluation: Psychosocial Re-Evaluation    [place] Name 06/01/19 1117              Psychosocial Re-Evaluation   Current issues with  None Identified       Comments  [name] does not report any psychosocial interventions.       Expected Outcomes  [name] will maintain a positive outlook with good coping skills.       Interventions  Encouraged to attend Cardiac Rehabilitation for the exercise       Continue Psychosocial Services   No Follow up required          Psychosocial Discharge (Final Psychosocial Re-Evaluation): Psychosocial Re-Evaluation - 06/01/19 1117      Psychosocial Re-Evaluation   Current issues with  None Identified    Comments  [name] does not report any psychosocial interventions.    Expected Outcomes  [name] will maintain a positive outlook with good coping skills.    Interventions  Encouraged to attend Cardiac Rehabilitation for the exercise    Continue Psychosocial Services   No Follow up required       Vocational Rehabilitation: Provide vocational rehab assistance to qualifying candidates.   Vocational Rehab Evaluation & Intervention: Vocational Rehab - 05/04/19 1146      Initial Vocational Rehab Evaluation & Intervention   Assessment shows need for Vocational Rehabilitation  No       Education: Education Goals: Education classes will be provided on a weekly basis, covering required topics. Participant will state understanding/return demonstration of topics presented.  Learning Barriers/Preferences: Learning Barriers/Preferences - 05/04/19 1146      Learning Barriers/Preferences   Learning Barriers  None    Learning Preferences  Individual Instruction       Education Topics: Count Your Pulse:  -Group instruction provided by verbal instruction, demonstration, patient participation and written materials to support subject.  Instructors address importance of being able to find your pulse and how to count your pulse when at home without a heart monitor.  Patients get hands on experience counting their pulse with staff help and  individually.   Heart Attack, Angina, and Risk Factor Modification:  -Group instruction provided by verbal instruction, video, and written materials to support subject.  Instructors address signs and symptoms of angina and heart attacks.    Also discuss risk factors for heart disease and how to make changes to improve heart health risk factors.   Functional Fitness:  -  Group instruction provided by verbal instruction, demonstration, patient participation, and written materials to support subject.  Instructors address safety measures for doing things around the house.  Discuss how to get up and down off the floor, how to pick things up properly, how to safely get out of a chair without assistance, and balance training.   Meditation and Mindfulness:  -Group instruction provided by verbal instruction, patient participation, and written materials to support subject.  Instructor addresses importance of mindfulness and meditation practice to help reduce stress and improve awareness.  Instructor also leads participants through a meditation exercise.    Stretching for Flexibility and Mobility:  -Group instruction provided by verbal instruction, patient participation, and written materials to support subject.  Instructors lead participants through series of stretches that are designed to increase flexibility thus improving mobility.  These stretches are additional exercise for major muscle groups that are typically performed during regular warm up and cool down.   Hands Only CPR:  -Group verbal, video, and participation provides a basic overview of AHA guidelines for community CPR. Role-play of emergencies allow participants the opportunity to practice calling for help and chest compression technique with discussion of AED use.   Hypertension: -Group verbal and written instruction that provides a basic overview of hypertension including the most recent diagnostic guidelines, risk factor reduction with  self-care instructions and medication management.    Nutrition I class: Heart Healthy Eating:  -Group instruction provided by PowerPoint slides, verbal discussion, and written materials to support subject matter. The instructor gives an explanation and review of the Therapeutic Lifestyle Changes diet recommendations, which includes a discussion on lipid goals, dietary fat, sodium, fiber, plant stanol/sterol esters, sugar, and the components of a well-balanced, healthy diet.   Nutrition II class: Lifestyle Skills:  -Group instruction provided by PowerPoint slides, verbal discussion, and written materials to support subject matter. The instructor gives an explanation and review of label reading, grocery shopping for heart health, heart healthy recipe modifications, and ways to make healthier choices when eating out.   Diabetes Question & Answer:  -Group instruction provided by PowerPoint slides, verbal discussion, and written materials to support subject matter. The instructor gives an explanation and review of diabetes co-morbidities, pre- and post-prandial blood glucose goals, pre-exercise blood glucose goals, signs, symptoms, and treatment of hypoglycemia and hyperglycemia, and foot care basics.   Diabetes Blitz:  -Group instruction provided by PowerPoint slides, verbal discussion, and written materials to support subject matter. The instructor gives an explanation and review of the physiology behind type 1 and type 2 diabetes, diabetes medications and rational behind using different medications, pre- and post-prandial blood glucose recommendations and Hemoglobin A1c goals, diabetes diet, and exercise including blood glucose guidelines for exercising safely.    Portion Distortion:  -Group instruction provided by PowerPoint slides, verbal discussion, written materials, and food models to support subject matter. The instructor gives an explanation of serving size versus portion size, changes in  portions sizes over the last 20 years, and what consists of a serving from each food group.   Stress Management:  -Group instruction provided by verbal instruction, video, and written materials to support subject matter.  Instructors review role of stress in heart disease and how to cope with stress positively.     Exercising on Your Own:  -Group instruction provided by verbal instruction, power point, and written materials to support subject.  Instructors discuss benefits of exercise, components of exercise, frequency and intensity of exercise, and end points for exercise.  Also discuss use of nitroglycerin and activating EMS.  Review options of places to exercise outside of rehab.  Review guidelines for sex with heart disease.   Cardiac Drugs I:  -Group instruction provided by verbal instruction and written materials to support subject.  Instructor reviews cardiac drug classes: antiplatelets, anticoagulants, beta blockers, and statins.  Instructor discusses reasons, side effects, and lifestyle considerations for each drug class.   Cardiac Drugs II:  -Group instruction provided by verbal instruction and written materials to support subject.  Instructor reviews cardiac drug classes: angiotensin converting enzyme inhibitors (ACE-I), angiotensin II receptor blockers (ARBs), nitrates, and calcium channel blockers.  Instructor discusses reasons, side effects, and lifestyle considerations for each drug class.   Anatomy and Physiology of the Circulatory System:  Group verbal and written instruction and models provide basic cardiac anatomy and physiology, with the coronary electrical and arterial systems. Review of: AMI, Angina, Valve disease, Heart Failure, Peripheral Artery Disease, Cardiac Arrhythmia, Pacemakers, and the ICD.   Other Education:  -Group or individual verbal, written, or video instructions that support the educational goals of the cardiac rehab program.   Holiday Eating Survival  Tips:  -Group instruction provided by PowerPoint slides, verbal discussion, and written materials to support subject matter. The instructor gives patients tips, tricks, and techniques to help them not only survive but enjoy the holidays despite the onslaught of food that accompanies the holidays.   Knowledge Questionnaire Score: Knowledge Questionnaire Score - 05/10/19 1302      Knowledge Questionnaire Score   Pre Score  22/24       Core Components/Risk Factors/Patient Goals at Admission: Personal Goals and Risk Factors at Admission - 05/08/19 1050      Core Components/Risk Factors/Patient Goals on Admission    Weight Management  Obesity    Hypertension  Yes    Intervention  Provide education on lifestyle modifcations including regular physical activity/exercise, weight management, moderate sodium restriction and increased consumption of fresh fruit, vegetables, and low fat dairy, alcohol moderation, and smoking cessation.;Monitor prescription use compliance.    Expected Outcomes  Short Term: Continued assessment and intervention until BP is < 140/3mm HG in hypertensive participants. < 130/25mm HG in hypertensive participants with diabetes, heart failure or chronic kidney disease.    Lipids  Yes    Intervention  Provide education and support for participant on nutrition & aerobic/resistive exercise along with prescribed medications to achieve LDL 70mg , HDL >40mg .    Expected Outcomes  Short Term: Participant states understanding of desired cholesterol values and is compliant with medications prescribed. Participant is following exercise prescription and nutrition guidelines.;Long Term: Cholesterol controlled with medications as prescribed, with individualized exercise RX and with personalized nutrition plan. Value goals: LDL < 70mg , HDL > 40 mg.       Core Components/Risk Factors/Patient Goals Review:  Goals and Risk Factor Review    Row Name 05/08/19 1051 06/01/19 1118            Core Components/Risk Factors/Patient Goals Review   Personal Goals Review  Weight Management/Obesity;Hypertension;Lipids  Weight Management/Obesity;Hypertension;Lipids      Review  Pt with multiple CAD RFs willing to participate in CR exercise.  [name] would like to be able to walk longer and increase his strength and stamina.  Pt with multiple CAD RFs willing to participate in CR exercise.  [name] is tolerating increased workloads very well.      Expected Outcomes  [name] will continue to participate in CR exercise to reduce his risk of CV  disease.  [name] will continue to participate in CR exercise to reduce his risk of CV disease.         Core Components/Risk Factors/Patient Goals at Discharge (Final Review):  Goals and Risk Factor Review - 06/01/19 1118      Core Components/Risk Factors/Patient Goals Review   Personal Goals Review  Weight Management/Obesity;Hypertension;Lipids    Review  Pt with multiple CAD RFs willing to participate in CR exercise.  [name] is tolerating increased workloads very well.    Expected Outcomes  [name] will continue to participate in CR exercise to reduce his risk of CV disease.       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  He started his new medication and did not have any CP with activity today.  30 Day ITP Review. [name] continues to tolerate exercise well.  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        Comments: See ITP Comments.

## 2019-06-01 NOTE — Telephone Encounter (Signed)
Patient called back. He provided me with the following information:  Serial [id number] Device #[id number].   Thanked him for providing with the information.   Called [facility] again and asked to speak with Larkin Ina again. He had left for the day. Provided Gilda with the above information. She was able to update his information. Download has been printed.   Nothing further needed at time of call.

## 2019-06-01 NOTE — Telephone Encounter (Signed)
Patient is scheduled for a MyChart video visit tomorrow at [place] with Aaron Edelman. This visit is for his CPAP machine. Per our records, patient received a new machine in December 2020. Called [facility] and spoke with [place]. He stated that they only had records for his old machine. After reviewing his notes, he did see that the patient received a new machine back in December but they do not have the serial number nor device number. Without this info, they are not able to add him to [facility]. Larkin Ina stated that I could call back after I have this information for them to add him. They will also reach out to the patient and continue to look through their records.   Called patient to obtain this information. He did not answer. Left a message for him to call us back.

## 2019-06-02 ENCOUNTER — Encounter (HOSPITAL_COMMUNITY)
Admission: RE | Admit: 2019-06-02 | Discharge: 2019-06-02 | Disposition: A | Discharge status: Home / Self Care | Payer: PPO | Source: Ambulatory Visit | Attending: Cardiology | Admitting: Cardiology

## 2019-06-02 ENCOUNTER — Encounter: Payer: Self-pay | Admitting: Pulmonary Disease

## 2019-06-02 ENCOUNTER — Other Ambulatory Visit: Payer: Self-pay

## 2019-06-02 ENCOUNTER — Ambulatory Visit (INDEPENDENT_AMBULATORY_CARE_PROVIDER_SITE_OTHER): Payer: PPO | Admitting: Pulmonary Disease

## 2019-06-02 DIAGNOSIS — G4733 Obstructive sleep apnea (adult) (pediatric): Secondary | ICD-10-CM | POA: Diagnosis not present

## 2019-06-02 DIAGNOSIS — Z955 Presence of coronary angioplasty implant and graft: Secondary | ICD-10-CM

## 2019-06-02 DIAGNOSIS — J4540 Moderate persistent asthma, uncomplicated: Secondary | ICD-10-CM | POA: Diagnosis not present

## 2019-06-02 NOTE — Patient Instructions (Addendum)
You were seen today by Lauraine Rinne, NP  for:   It was nice talking with you you are doing great on your CPAP keep up the hard work.  I have contacted your [facility] and they said that you have paid up completely on your account. [facility] stated they will call you. Follow-up with our office in 1 year  Take care and stay safe,  Dreden Rivere  1. OSA (obstructive sleep apnea)  Refill supplies For 1 year DME: [facility]  We recommend that you continue using your CPAP daily >>>Keep up the hard work using your device >>> Goal should be wearing this for the entire night that you are sleeping, at least 4 to 6 hours  Remember:  . Do not drive or operate heavy machinery if tired or drowsy.  . Please notify the supply company and office if you are unable to use your device regularly due to missing supplies or machine being broken.  . Work on maintaining a healthy weight and following your recommended nutrition plan  . Maintain proper daily exercise and movement  . Maintaining proper use of your device can also help improve management of other chronic illnesses such as: Blood pressure, blood sugars, and weight management.   BiPAP/ CPAP Cleaning:  >>>Clean weekly, with Dawn soap, and bottle brush.  Set up to air dry. >>> Wipe mask out daily with wet wipe or towelette    2. Moderate persistent asthma without complication  Continue Flovent   Only use your albuterol as a rescue medication to be used if you can't catch your breath by resting or doing a relaxed purse lip breathing pattern.  - The less you use it, the better it will work when you need it. - Ok to use up to 2 puffs  every 4 hours if you must but call for immediate appointment if use goes up over your usual need - Don't leave home without it !!  (think of it like the spare tire for your car)    Follow Up:    Return in about 1 year (around 06/01/2020), or if symptoms worsen or fail to improve, for Follow up with Dr. Halford Chessman, Follow  up with Wyn Quaker FNP-C.   Please do your part to reduce the spread of COVID-19:      Reduce your risk of any infection  and COVID19 by using the similar precautions used for avoiding the common cold or flu:  [name] Wash your hands often with soap and warm water for at least 20 seconds.  If soap and water are not readily available, use an alcohol-based hand sanitizer with at least 60% alcohol.  . If coughing or sneezing, cover your mouth and nose by coughing or sneezing into the elbow areas of your shirt or coat, into a tissue or into your sleeve (not your hands). Langley Gauss A MASK when in public  . Avoid shaking hands with others and consider head nods or verbal greetings only. . Avoid touching your eyes, nose, or mouth with unwashed hands.  . Avoid close contact with people who are sick. . Avoid places or events with large numbers of people in one location, like concerts or sporting events. . If you have some symptoms but not all symptoms, continue to monitor at home and seek medical attention if your symptoms worsen. . If you are having a medical emergency, call 911.   ADDITIONAL HEALTHCARE OPTIONS FOR PATIENTS   Telehealth / e-Visit: [email or web address]  [facility]: [facility]: [id number]                   [facility]: [id number]     It is flu season:   >>> Best ways to protect herself from the flu: Receive the yearly flu vaccine, practice good hand hygiene washing with soap and also using hand sanitizer when available, eat a nutritious meals, get adequate rest, hydrate appropriately   Please contact the office if your symptoms worsen or you have concerns that you are not improving.   Thank you for choosing [facility] for your healthcare, and for allowing [place] to partner with you on your healthcare journey. I am thankful to be able to provide care to you  today.   Wyn Quaker FNP-C

## 2019-06-02 NOTE — Assessment & Plan Note (Addendum)
Plan:  Continue to wear CPAP  Refill supplies / mask  I personally called DME [facility] - 43 dollars recently paid, paid up per [facility] - account 0.00 per Melissa with [employer]

## 2019-06-02 NOTE — Assessment & Plan Note (Addendum)
Follows with Dr. Remus Blake   Plan:  Continue Allergy Asthma Continue Flovent  Continue rescue inhaler

## 2019-06-05 ENCOUNTER — Encounter (HOSPITAL_COMMUNITY)
Admission: RE | Admit: 2019-06-05 | Discharge: 2019-06-05 | Disposition: A | Discharge status: Home / Self Care | Payer: PPO | Source: Ambulatory Visit | Attending: Cardiology | Admitting: Cardiology

## 2019-06-05 ENCOUNTER — Other Ambulatory Visit: Payer: Self-pay

## 2019-06-05 DIAGNOSIS — Z955 Presence of coronary angioplasty implant and graft: Secondary | ICD-10-CM | POA: Diagnosis not present

## 2019-06-06 ENCOUNTER — Encounter: Payer: Self-pay | Admitting: Family Medicine

## 2019-06-06 ENCOUNTER — Ambulatory Visit (INDEPENDENT_AMBULATORY_CARE_PROVIDER_SITE_OTHER): Payer: PPO | Admitting: Family Medicine

## 2019-06-06 VITALS — BP 118/60 | HR 82 | Temp 97.3°F | Ht 66.0 in | Wt 215.0 lb

## 2019-06-06 DIAGNOSIS — G629 Polyneuropathy, unspecified: Secondary | ICD-10-CM | POA: Diagnosis not present

## 2019-06-06 DIAGNOSIS — J301 Allergic rhinitis due to pollen: Secondary | ICD-10-CM | POA: Diagnosis not present

## 2019-06-06 DIAGNOSIS — I10 Essential (primary) hypertension: Secondary | ICD-10-CM | POA: Diagnosis not present

## 2019-06-06 DIAGNOSIS — I700 Atherosclerosis of aorta: Secondary | ICD-10-CM

## 2019-06-06 DIAGNOSIS — I2510 Atherosclerotic heart disease of native coronary artery without angina pectoris: Secondary | ICD-10-CM | POA: Diagnosis not present

## 2019-06-06 DIAGNOSIS — J3089 Other allergic rhinitis: Secondary | ICD-10-CM | POA: Diagnosis not present

## 2019-06-06 DIAGNOSIS — D692 Other nonthrombocytopenic purpura: Secondary | ICD-10-CM

## 2019-06-06 DIAGNOSIS — F325 Major depressive disorder, single episode, in full remission: Secondary | ICD-10-CM | POA: Diagnosis not present

## 2019-06-06 DIAGNOSIS — E785 Hyperlipidemia, unspecified: Secondary | ICD-10-CM

## 2019-06-06 DIAGNOSIS — R739 Hyperglycemia, unspecified: Secondary | ICD-10-CM

## 2019-06-06 DIAGNOSIS — J3081 Allergic rhinitis due to animal (cat) (dog) hair and dander: Secondary | ICD-10-CM | POA: Diagnosis not present

## 2019-06-06 LAB — COMPREHENSIVE METABOLIC PANEL
ALT: 19 U/L (ref 0–53)
AST: 16 U/L (ref 0–37)
Albumin: 3.8 g/dL (ref 3.5–5.2)
Alkaline Phosphatase: 58 U/L (ref 39–117)
BUN: 20 mg/dL (ref 6–23)
CO2: 29 mEq/L (ref 19–32)
Calcium: 9.8 mg/dL (ref 8.4–10.5)
Chloride: 104 mEq/L (ref 96–112)
Creatinine, Ser: 1.26 mg/dL (ref 0.40–1.50)
GFR: 55.6 mL/min — ABNORMAL LOW (ref >60.00)
Glucose, Bld: 84 mg/dL (ref 70–99)
Potassium: 4.1 mEq/L (ref 3.5–5.1)
Sodium: 139 mEq/L (ref 135–145)
Total Bilirubin: 0.8 mg/dL (ref 0.2–1.2)
Total Protein: 6.3 g/dL (ref 6.0–8.3)

## 2019-06-06 LAB — HEMOGLOBIN A1C: Hgb A1c MFr Bld: 5.6 % (ref 4.6–6.5)

## 2019-06-06 MED ORDER — HYDROCHLOROTHIAZIDE 25 MG PO TABS: 12.5000 mg | ORAL_TABLET | Freq: Every day | ORAL | 3 refills | Status: DC

## 2019-06-06 NOTE — Patient Instructions (Addendum)
Lets try hydrochlorothiazide 12.5 mg (half of 25 mg tablet). Let me know if blood pressure trends up to more than 130/80 on regular basis- I think you will do well with this lower dose though  Please stop by lab before you go If you do not have mychart- we will call you about results within 5 business days of [place] receiving them.  If you have mychart- we will send your results within 3 business days of [place] receiving them.  If abnormal or we want to clarify a result, we will call or mychart you to make sure you receive the message.  If you have questions or concerns or don't hear within 5 business days, please send [place] a message or call us.   Recommended follow up: Return in about 3 months (around 09/06/2019) for follow up- or sooner if needed.

## 2019-06-06 NOTE — Progress Notes (Signed)
Phone [phone] In person visit   Subjective:   Joshua [name] is a 79 y.o. year old very pleasant male patient who presents for/with See problem oriented charting Chief Complaint  Patient presents with  . Coronary Artery Disease    This visit occurred during the SARS-CoV-2 public health emergency.  Safety protocols were in place, including screening questions prior to the visit, additional usage of staff PPE, and extensive cleaning of exam room while observing appropriate contact time as indicated for disinfecting solutions.   Past Medical History-  Patient Active Problem List   Diagnosis Date Noted  . Coronary atherosclerosis 10/12/2006    Priority: High  . Irritable bowel syndrome 10/12/2006    Priority: High  . Hyperglycemia 10/07/2017    Priority: Medium  . Gout 11/20/2016    Priority: Medium  . CKD (chronic kidney disease), stage III ([place]) 10/23/2013    Priority: Medium  . Neuropathy ([place]) 08/21/2013    Priority: Medium  . History of bladder cancer 04/01/2010    Priority: Medium  . Essential hypertension 02/21/2007    Priority: Medium  . Hyperlipidemia 10/12/2006    Priority: Medium  . Depression, major, single episode, in partial remission ([place]) 10/12/2006    Priority: Medium  . Asthma 10/12/2006    Priority: Medium  . Aortic atherosclerosis ([place]) 05/05/2018    Priority: Low  . Senile purpura ([place]) 10/07/2017    Priority: Low  . DJD (degenerative joint disease)     Priority: Low  . Arthritis     Priority: Low  . Neck pain 02/08/2017    Priority: Low  . Venous insufficiency 12/29/2016    Priority: Low  . Ganglion cyst of tendon sheath of right hand 12/29/2016    Priority: Low  . Avulsion fracture of metatarsal bone of right foot 11/25/2016    Priority: Low  . Obesity, Class I, BMI 30-34.9 10/03/2015    Priority: Low  . Allergic rhinitis 05/31/2014    Priority: Low  . Fatigue 11/09/2013    Priority: Low  . Dizziness 08/23/2013    Priority: Low   . RLS (restless legs syndrome) 01/03/2013    Priority: Low  . Chronic bilateral low back pain with left-sided sciatica 10/09/2011    Priority: Low  . OSTEOARTHRITIS, LOWER LEG, LEFT 10/04/2009    Priority: Low  . TOBACCO ABUSE, HX OF 12/26/2008    Priority: Low  . ANEMIA, IRON DEFICIENCY 11/06/2008    Priority: Low  . 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 OSA (obstructive sleep apnea) 11/02/2006    Priority: Low  . GERD 10/12/2006    Priority: Low  . Educated about COVID-19 virus infection 04/20/2019  . Unstable angina ([place])   . Greater trochanteric bursitis of left hip 10/27/2018  . Degenerative disc disease, lumbar 10/27/2018  . Abdominal muscle strain, initial encounter 06/16/2018  . Exertional angina ([place]) 04/18/2018  . Weakness of left leg 06/21/2017  . Hereditary and idiopathic peripheral neuropathy 09/25/2014    Medications- reviewed and updated Current Outpatient Medications  Medication Sig Dispense Refill  . acetaminophen (TYLENOL) 500 MG tablet Take 1,000 mg by mouth as needed for moderate pain or headache.     . albuterol (PROAIR HFA) 108 (90 BASE) MCG/ACT inhaler Inhale 1-2 puffs into the lungs every 6 (six) hours as needed for wheezing or shortness of breath. (Patient taking differently: Inhale 1-2 puffs into the lungs as needed for wheezing or shortness of breath. ) 18 g 3  .  amLODipine (NORVASC) 5 MG tablet Take 1 tablet (5 mg total) by mouth daily. 30 tablet 3  . aspirin EC 81 MG tablet Take 81 mg by mouth at bedtime.     . Cholecalciferol (VITAMIN D3) 50 MCG (2000 UT) TABS Take 2,000 Units by mouth daily.    . clopidogrel (PLAVIX) 75 MG tablet Take 1 tablet (75 mg total) by mouth daily with breakfast. 90 tablet 3  . diazepam (VALIUM) 5 MG tablet Take 1 tablet (5 mg total) by mouth at bedtime as needed. (Patient taking differently: Take 5 mg by mouth at bedtime as needed (sleep). ) 30 tablet 1  . EPINEPHrine 0.3 mg/0.3 mL IJ SOAJ injection  See admin instructions. for allergic reaction    . Evolocumab (REPATHA SURECLICK) [id number] MG/ML SOAJ Inject 140 mg into the skin every 14 (fourteen) days. 6 pen 3  . FLOVENT HFA 110 MCG/ACT inhaler Inhale 1 puff into the lungs 2 (two) times daily.   5  . fluticasone (FLONASE) 50 MCG/ACT nasal spray Place 1 spray into both nostrils 2 (two) times daily.     . folic acid (FOLVITE) [id number] MCG tablet Take 800 mcg by mouth daily.     [name] gabapentin (NEURONTIN) 300 MG capsule Take 1 cap in AM, 1 cap at noon, 2 caps at bedtime (Patient taking differently: Take 300-600 mg by mouth See admin instructions. Take 1 capsule (300 mg) by mouth in the morning, take 1 capsule (300 mg) by mouth with lunch, & take 2 capsules (600 mg) by mouth at bedtime.) 360 capsule 3  . Glucosamine-Chondroitin (MOVE FREE PO) Take 1 tablet by mouth daily.    . hydrochlorothiazide (HYDRODIURIL) 25 MG tablet Take 0.5 tablets (12.5 mg total) by mouth daily. 45 tablet 3  . ketoconazole (NIZORAL) 2 % cream Apply 1 application topically 2 (two) times daily as needed for irritation.     [name] loratadine (CLARITIN) 10 MG tablet Take 10 mg by mouth at bedtime.     [name] losartan (COZAAR) 100 MG tablet TAKE 1 TABLET BY MOUTH EVERY DAY (Patient taking differently: Take 100 mg by mouth at bedtime. ) 90 tablet 3  . Miconazole Nitrate (LOTRIMIN AF DEODORANT POWDER) 2 % AERP Apply 1 spray topically 2 (two) times daily.    . Misc Natural Products (TART CHERRY ADVANCED PO) Take 1 tablet by mouth daily.    . ondansetron (ZOFRAN ODT) 8 MG disintegrating tablet Take 1 tablet (8 mg total) by mouth every 8 (eight) hours as needed for nausea or vomiting. 20 tablet 0  . pantoprazole (PROTONIX) 40 MG tablet TAKE 1 TABLET EVERY DAY 30 tablet 4  . polyethylene glycol powder (GLYCOLAX/MIRALAX) powder Take 17 g by mouth daily. (Patient taking differently: Take 17 g by mouth 2 (two) times daily. ) 255 g 0  . Probiotic Product (ALIGN PO) Take 7.5 g by mouth daily.    [name] pyridOXINE  (VITAMIN B-6) 100 MG tablet Take 100 mg by mouth daily with supper.    . vitamin B-12 (CYANOCOBALAMIN) 1000 MCG tablet Take 1,000 mcg by mouth daily.    . hydrocortisone 2.5 % ointment     . meclizine (ANTIVERT) 25 MG tablet Take 1 tablet (25 mg total) by mouth 3 (three) times daily as needed for dizziness. (Patient not taking: Reported on 06/06/2019) 30 tablet 0  . traMADol (ULTRAM) 50 MG tablet Take 1 tablet (50 mg total) by mouth every 6 (six) hours as needed. (Patient not taking: Reported on 06/06/2019) 20 tablet  1   No current facility-administered medications for this visit.     Objective:  BP 118/60   Pulse 82   Temp (!) 97.3 F (36.3 C) (Oral)   Ht 5\' 6"  (1.676 m)   Wt 215 lb (97.5 kg)   SpO2 96%   BMI 34.70 kg/m  Gen: NAD, resting comfortably CV: RRR no murmurs rubs or gallops Lungs: CTAB no crackles, wheeze, rhonchi Abdomen: soft/nontender/nondistended/normal bowel sounds.  Ext: trace edema- slightly worse on left Skin: warm, dry    Assessment and Plan   #CAD/hyperlipidemia-history of CABG x5- follows with Dr. Percival Spanish S: Compliant with Repatha and simvastatin for lipids.  He is compliant with aspirin as well.   -Stent drug eluting placed 04/13/19 Dr. Angelena Form -feels tired after rehab sessions- feet hurt afterwards -enjoys workouts while he does them. Exercises are seated mostly due to back pain with prolonged periods on feet.  -states has lost a little weight- being intentional about healthy eating Lab Results  Component Value Date   CHOL 96 11/03/2018   HDL 46.20 11/03/2018   LDLCALC 18 11/03/2018   LDLDIRECT 115.0 06/04/2016   TRIG 158.0 (H) 11/03/2018   CHOLHDL 2 11/03/2018  A/P: asymptomatic after his stent. Continue plavix, aspirin, repatha, and simvastatin.   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  On ARB in case proteinuric element BP Readings from Last 3 Encounters:  06/06/19 118/60  05/04/19 126/70  05/04/19 134/86  A/P: Blood pressure very well controlled-with exercise is getting into low 100s. Also has some urinary issues though better with jock strap- we opted to try half tablet of hydrochlorothiazide for now.  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 Depression screen [employer] 2/9 05/04/2019 02/06/2019 11/02/2018  Decreased Interest 0 0 0  Down, Depressed, Hopeless 1 1 1   PHQ - 2 Score 1 1 1   Altered sleeping - 0 1  Tired, decreased energy - 1 2  Change in appetite - 0 2  Feeling bad or failure about yourself  - 0 0  Trouble concentrating - 0 0  Moving slowly or fidgety/restless - 0 0  Suicidal thoughts - 0 0  PHQ-9 Score - 2 6  Difficult doing work/chores - Not difficult at all Not difficult at all  Some recent data might be hidden  A/P: continues to be in full remission- continue without medicine. phq9 score only 1- some stress with wife having irritability at night but he is managing the best he can   % Neuropathy-follows with [name] S: Compliant with gabapentin.  Burning/needle pain in the feet. He stated that he feels the neuropathy in his feet more after his cardiac rehab.  A/P: appears stable- continue close follow up with [name]   #Hyperglycemia history of mildly elevated A1c.  Prediabetes range. Update a1c today Lab Results  Component Value Date   HGBA1C 5.8 11/03/2018   #Senile purpura-easy  bruising and bleeding on  aspirin.  Stable  #Aortic atherosclerosis-Noted on CT 2016.  Likely stable.  Continue risk factor modification  Recommended follow up: Return in about 3 months (around 09/06/2019) for follow up- or sooner if needed. Future Appointments  Date Time Provider [place]  06/07/2019  8:45 AM [facility] PHASE II EXC [facility] None  06/09/2019  8:45 AM [facility] PHASE II EXC [facility] None  06/12/2019  8:45 AM [facility] PHASE II EXC [facility] None  06/14/2019  8:45 AM [facility] PHASE II EXC [facility] None  06/16/2019  8:45 AM [facility] PHASE II EXC [facility] None  06/19/2019  8:45 AM [facility] PHASE II EXC [facility] None  06/21/2019  8:45 AM [facility] PHASE II EXC [facility] None  06/23/2019  8:45 AM [facility] PHASE II EXC [facility] None  06/26/2019  8:45 AM [facility] PHASE II EXC [facility] None  06/28/2019  8:45 AM [facility] PHASE II EXC [facility] None  06/30/2019  8:45 AM [facility] PHASE II EXC [facility] None  07/19/2019  9:45 AM Almyra Deforest, PA [facility]  10/03/2019  8:30 AM Cameron Sprang, MD [facility] None  02/23/2020  8:20 AM [name], MD [facility]   Lab/Order associations:   ICD-10-CM   1. Atherosclerosis of native coronary artery of native heart without angina pectoris  I25.10   2. Essential hypertension  I10 Comprehensive metabolic panel  3. Major depressive disorder with single episode, in full remission ([place]) Chronic F32.5   4. Aortic atherosclerosis ([facility]) Chronic I70.0   5. Senile purpura ([facility]) Chronic D69.2   6. Hyperlipidemia, unspecified hyperlipidemia type  E78.5   7. Neuropathy ([place])  G62.9   8. Hyperglycemia  R73.9 Hemoglobin A1c   Meds ordered this encounter  Medications  . hydrochlorothiazide (HYDRODIURIL) 25 MG tablet    Sig: Take 0.5 tablets (12.5 mg total) by mouth daily.    Dispense:  45 tablet    Refill:  3   Return precautions advised.  [name], MD

## 2019-06-06 NOTE — Assessment & Plan Note (Signed)
#  Hypertension/CKD stage III S: Compliant with hydrochlorothiazide 25 mg and losartan 100 mg -GFR has been stable in 60s for most part- slight worsening after heart cath in january.  On ARB in case proteinuric element BP Readings from Last 3 Encounters:  06/06/19 118/60  05/04/19 126/70  05/04/19 134/86  A/P: Blood pressure very well controlled-with exercise is getting into low 100s. Also has some urinary issues though better with jock strap- we opted to try half tablet of hydrochlorothiazide for now.  also  Hoping chronic kidney disease is stable/slightly improved further out from cath-update BMP today

## 2019-06-07 ENCOUNTER — Other Ambulatory Visit: Payer: Self-pay

## 2019-06-07 ENCOUNTER — Encounter (HOSPITAL_COMMUNITY)
Admission: RE | Admit: 2019-06-07 | Discharge: 2019-06-07 | Disposition: A | Discharge status: Home / Self Care | Payer: PPO | Source: Ambulatory Visit | Attending: Cardiology | Admitting: Cardiology

## 2019-06-07 DIAGNOSIS — Z955 Presence of coronary angioplasty implant and graft: Secondary | ICD-10-CM

## 2019-06-09 ENCOUNTER — Other Ambulatory Visit: Payer: Self-pay

## 2019-06-09 ENCOUNTER — Encounter (HOSPITAL_COMMUNITY)
Admission: RE | Admit: 2019-06-09 | Discharge: 2019-06-09 | Disposition: A | Discharge status: Home / Self Care | Payer: PPO | Source: Ambulatory Visit | Attending: Cardiology | Admitting: Cardiology

## 2019-06-09 DIAGNOSIS — Z955 Presence of coronary angioplasty implant and graft: Secondary | ICD-10-CM | POA: Diagnosis not present

## 2019-06-12 ENCOUNTER — Other Ambulatory Visit: Payer: Self-pay

## 2019-06-12 ENCOUNTER — Encounter (HOSPITAL_COMMUNITY)
Admission: RE | Admit: 2019-06-12 | Discharge: 2019-06-12 | Disposition: A | Discharge status: Home / Self Care | Payer: PPO | Source: Ambulatory Visit | Attending: Cardiology | Admitting: Cardiology

## 2019-06-12 DIAGNOSIS — Z955 Presence of coronary angioplasty implant and graft: Secondary | ICD-10-CM

## 2019-06-14 ENCOUNTER — Encounter (HOSPITAL_COMMUNITY)
Admission: RE | Admit: 2019-06-14 | Discharge: 2019-06-14 | Disposition: A | Discharge status: Home / Self Care | Payer: PPO | Source: Ambulatory Visit | Attending: Cardiology | Admitting: Cardiology

## 2019-06-14 ENCOUNTER — Encounter: Payer: Self-pay | Admitting: Family Medicine

## 2019-06-14 ENCOUNTER — Other Ambulatory Visit: Payer: Self-pay

## 2019-06-14 DIAGNOSIS — Z955 Presence of coronary angioplasty implant and graft: Secondary | ICD-10-CM | POA: Diagnosis not present

## 2019-06-16 ENCOUNTER — Other Ambulatory Visit: Payer: Self-pay

## 2019-06-16 ENCOUNTER — Encounter (HOSPITAL_COMMUNITY)
Admission: RE | Admit: 2019-06-16 | Discharge: 2019-06-16 | Disposition: A | Discharge status: Home / Self Care | Payer: PPO | Source: Ambulatory Visit | Attending: Cardiology | Admitting: Cardiology

## 2019-06-16 DIAGNOSIS — Z955 Presence of coronary angioplasty implant and graft: Secondary | ICD-10-CM | POA: Diagnosis not present

## 2019-06-19 ENCOUNTER — Encounter (HOSPITAL_COMMUNITY)
Admission: RE | Admit: 2019-06-19 | Discharge: 2019-06-19 | Disposition: A | Discharge status: Home / Self Care | Payer: PPO | Source: Ambulatory Visit | Attending: Cardiology | Admitting: Cardiology

## 2019-06-19 ENCOUNTER — Other Ambulatory Visit: Payer: Self-pay

## 2019-06-19 VITALS — Ht 65.75 in | Wt 215.4 lb

## 2019-06-19 DIAGNOSIS — G4733 Obstructive sleep apnea (adult) (pediatric): Secondary | ICD-10-CM | POA: Diagnosis not present

## 2019-06-19 DIAGNOSIS — Z955 Presence of coronary angioplasty implant and graft: Secondary | ICD-10-CM | POA: Diagnosis not present

## 2019-06-20 ENCOUNTER — Telehealth (HOSPITAL_COMMUNITY): Payer: Self-pay

## 2019-06-20 ENCOUNTER — Telehealth: Payer: Self-pay

## 2019-06-20 DIAGNOSIS — J3089 Other allergic rhinitis: Secondary | ICD-10-CM | POA: Diagnosis not present

## 2019-06-20 DIAGNOSIS — J301 Allergic rhinitis due to pollen: Secondary | ICD-10-CM | POA: Diagnosis not present

## 2019-06-20 DIAGNOSIS — J3081 Allergic rhinitis due to animal (cat) (dog) hair and dander: Secondary | ICD-10-CM | POA: Diagnosis not present

## 2019-06-20 NOTE — Progress Notes (Signed)
Patient stopped by [facility] this am to inquire about PREP.  Explained I had attempted to reach him this am.  He was in neighborhood so stopped by. Explained program to him as well as frequency. Has [employer] membership already goes to [facility] Is interested in program and is finishing up Cardiac Rehab on 4/9. Given my contact information. I will call him with next class date.

## 2019-06-20 NOTE — Telephone Encounter (Signed)
Attempted to contact this am reference message left about wanting to join PREP program. Phone did not connect.

## 2019-06-20 NOTE — Telephone Encounter (Signed)
Pt called and stated that he wouldn't be able to come in for his cardiac rehab session on 06/21/2019 because he was having back trouble. I canceled his appt and sent a message to his cardiac rehab nurse. Pt called back 20 minutes later and stated that he is going to come in on 06/21/2019. I advised pt that I would put him back on the schedule for that day and I also sent a message to his cardiac rehab nurse of this.

## 2019-06-21 ENCOUNTER — Encounter (HOSPITAL_COMMUNITY)
Admission: RE | Admit: 2019-06-21 | Discharge: 2019-06-21 | Disposition: A | Discharge status: Home / Self Care | Payer: PPO | Source: Ambulatory Visit | Attending: Cardiology | Admitting: Cardiology

## 2019-06-21 ENCOUNTER — Other Ambulatory Visit: Payer: Self-pay

## 2019-06-21 ENCOUNTER — Encounter (HOSPITAL_COMMUNITY): Payer: PPO

## 2019-06-21 DIAGNOSIS — Z955 Presence of coronary angioplasty implant and graft: Secondary | ICD-10-CM

## 2019-06-22 ENCOUNTER — Telehealth: Payer: Self-pay

## 2019-06-22 NOTE — Telephone Encounter (Signed)
error 

## 2019-06-23 ENCOUNTER — Encounter (HOSPITAL_COMMUNITY)
Admission: RE | Admit: 2019-06-23 | Discharge: 2019-06-23 | Disposition: A | Discharge status: Home / Self Care | Payer: PPO | Source: Ambulatory Visit | Attending: Cardiology | Admitting: Cardiology

## 2019-06-23 ENCOUNTER — Other Ambulatory Visit: Payer: Self-pay

## 2019-06-23 DIAGNOSIS — Z955 Presence of coronary angioplasty implant and graft: Secondary | ICD-10-CM | POA: Diagnosis not present

## 2019-06-25 DIAGNOSIS — G4733 Obstructive sleep apnea (adult) (pediatric): Secondary | ICD-10-CM | POA: Diagnosis not present

## 2019-06-26 ENCOUNTER — Other Ambulatory Visit: Payer: Self-pay

## 2019-06-26 ENCOUNTER — Encounter (HOSPITAL_COMMUNITY)
Admission: RE | Admit: 2019-06-26 | Discharge: 2019-06-26 | Disposition: A | Discharge status: Home / Self Care | Payer: PPO | Source: Ambulatory Visit | Attending: Cardiology | Admitting: Cardiology

## 2019-06-26 DIAGNOSIS — Z955 Presence of coronary angioplasty implant and graft: Secondary | ICD-10-CM

## 2019-06-28 ENCOUNTER — Other Ambulatory Visit: Payer: Self-pay

## 2019-06-28 ENCOUNTER — Encounter (HOSPITAL_COMMUNITY)
Admission: RE | Admit: 2019-06-28 | Discharge: 2019-06-28 | Disposition: A | Discharge status: Home / Self Care | Payer: PPO | Source: Ambulatory Visit | Attending: Cardiology | Admitting: Cardiology

## 2019-06-28 DIAGNOSIS — Z955 Presence of coronary angioplasty implant and graft: Secondary | ICD-10-CM

## 2019-06-29 NOTE — Progress Notes (Signed)
Cardiac Individual Treatment Plan  Patient Details  Name: Joshua [name] MRN: [id number] Date of Birth: 05-12-1940 Referring Provider:     [place] from 05/04/2019 in [facility]  Referring Provider  Dr. [name] MD      Initial Encounter Date:    [place] from 05/04/2019 in [facility]  Date  05/04/19      Visit Diagnosis: Status post coronary artery stent placement  Patient's Home Medications on Admission:  Current Outpatient Medications:  .  acetaminophen (TYLENOL) 500 MG tablet, Take 1,000 mg by mouth as needed for moderate pain or headache. , Disp: , Rfl:  .  albuterol (PROAIR HFA) 108 (90 BASE) MCG/ACT inhaler, Inhale 1-2 puffs into the lungs every 6 (six) hours as needed for wheezing or shortness of breath. (Patient taking differently: Inhale 1-2 puffs into the lungs as needed for wheezing or shortness of breath. ), Disp: 18 g, Rfl: 3 .  amLODipine (NORVASC) 5 MG tablet, Take 1 tablet (5 mg total) by mouth daily., Disp: 30 tablet, Rfl: 3 .  aspirin EC 81 MG tablet, Take 81 mg by mouth at bedtime. , Disp: , Rfl:  .  Cholecalciferol (VITAMIN D3) 50 MCG (2000 UT) TABS, Take 2,000 Units by mouth daily., Disp: , Rfl:  .  clopidogrel (PLAVIX) 75 MG tablet, Take 1 tablet (75 mg total) by mouth daily with breakfast., Disp: 90 tablet, Rfl: 3 .  diazepam (VALIUM) 5 MG tablet, Take 1 tablet (5 mg total) by mouth at bedtime as needed. (Patient taking differently: Take 5 mg by mouth at bedtime as needed (sleep). ), Disp: 30 tablet, Rfl: 1 .  EPINEPHrine 0.3 mg/0.3 mL IJ SOAJ injection, See admin instructions. for allergic reaction, Disp: , Rfl:  .  Evolocumab (REPATHA SURECLICK) [id number] MG/ML SOAJ, Inject 140 mg into the skin every 14 (fourteen) days., Disp: 6 pen, Rfl: 3 .  FLOVENT HFA 110 MCG/ACT inhaler, Inhale 1 puff into the lungs 2 (two) times daily. , Disp: , Rfl: 5 .   fluticasone (FLONASE) 50 MCG/ACT nasal spray, Place 1 spray into both nostrils 2 (two) times daily. , Disp: , Rfl:  .  folic acid (FOLVITE) [id number] MCG tablet, Take 800 mcg by mouth daily. , Disp: , Rfl:  .  gabapentin (NEURONTIN) 300 MG capsule, Take 1 cap in AM, 1 cap at noon, 2 caps at bedtime (Patient taking differently: Take 300-600 mg by mouth See admin instructions. Take 1 capsule (300 mg) by mouth in the morning, take 1 capsule (300 mg) by mouth with lunch, & take 2 capsules (600 mg) by mouth at bedtime.), Disp: 360 capsule, Rfl: 3 .  Glucosamine-Chondroitin (MOVE FREE PO), Take 1 tablet by mouth daily., Disp: , Rfl:  .  hydrochlorothiazide (HYDRODIURIL) 25 MG tablet, Take 0.5 tablets (12.5 mg total) by mouth daily., Disp: 45 tablet, Rfl: 3 .  hydrocortisone 2.5 % ointment, , Disp: , Rfl:  .  ketoconazole (NIZORAL) 2 % cream, Apply 1 application topically 2 (two) times daily as needed for irritation. , Disp: , Rfl:  .  loratadine (CLARITIN) 10 MG tablet, Take 10 mg by mouth at bedtime. , Disp: , Rfl:  .  losartan (COZAAR) 100 MG tablet, TAKE 1 TABLET BY MOUTH EVERY DAY (Patient taking differently: Take 100 mg by mouth at bedtime. ), Disp: 90 tablet, Rfl: 3 .  meclizine (ANTIVERT) 25 MG tablet, Take 1 tablet (25 mg  total) by mouth 3 (three) times daily as needed for dizziness. (Patient not taking: Reported on 06/06/2019), Disp: 30 tablet, Rfl: 0 .  Miconazole Nitrate (LOTRIMIN AF DEODORANT POWDER) 2 % AERP, Apply 1 spray topically 2 (two) times daily., Disp: , Rfl:  .  Misc Natural Products (TART CHERRY ADVANCED PO), Take 1 tablet by mouth daily., Disp: , Rfl:  .  ondansetron (ZOFRAN ODT) 8 MG disintegrating tablet, Take 1 tablet (8 mg total) by mouth every 8 (eight) hours as needed for nausea or vomiting., Disp: 20 tablet, Rfl: 0 .  pantoprazole (PROTONIX) 40 MG tablet, TAKE 1 TABLET EVERY DAY, Disp: 30 tablet, Rfl: 4 .  polyethylene glycol powder (GLYCOLAX/MIRALAX) powder, Take 17 g by mouth  daily. (Patient taking differently: Take 17 g by mouth 2 (two) times daily. ), Disp: 255 g, Rfl: 0 .  Probiotic Product (ALIGN PO), Take 7.5 g by mouth daily., Disp: , Rfl:  .  pyridOXINE (VITAMIN B-6) 100 MG tablet, Take 100 mg by mouth daily with supper., Disp: , Rfl:  .  traMADol (ULTRAM) 50 MG tablet, Take 1 tablet (50 mg total) by mouth every 6 (six) hours as needed. (Patient not taking: Reported on 06/06/2019), Disp: 20 tablet, Rfl: 1 .  vitamin B-12 (CYANOCOBALAMIN) 1000 MCG tablet, Take 1,000 mcg by mouth daily., Disp: , Rfl:   Past Medical History: Past Medical History:  Diagnosis Date  . Acute medial meniscal tear   . Anemia   . Arthritis    "middle finger right hand; right knee; neck" (02/06/2014)  . Asthma   . Bladder cancer ([place]) 04/2010   "cauterized during prostate OR"  . CAD (coronary artery disease)    CABG 2001  . Depression   . Diverticulosis   . DJD (degenerative joint disease)    BACK  . GERD (gastroesophageal reflux disease)   . H/O inguinal hernia repair 12/2018  . History of gout   . Hyperlipidemia   . Hypertension   . IBS (irritable bowel syndrome)   . Obesity   . OSA (obstructive sleep apnea)    USES CPAP   . Pancreatitis ~ 1980  . Prostate cancer ([place]) 04/2010    Tobacco Use: Social History   Tobacco Use  Smoking Status Former Smoker  . Packs/day: 1.00  . Years: 35.00  . Pack years: 35.00  . Types: Cigarettes  . Quit date: 06/16/1992  . Years since quitting: 27.0  Smokeless Tobacco Never Used    Labs: Recent Chemical engineer    Labs for ITP Cardiac and Pulmonary Rehab Latest Ref Rng & Units 10/01/2017 01/27/2018 05/05/2018 11/03/2018 06/06/2019   Cholestrol 0 - 200 mg/dL 94 95 - 96 -   LDLCALC 0 - 99 mg/dL 16 16 - 18 -   LDLDIRECT mg/dL - - - - -   HDL >39.00 mg/dL 44 49 - 46.20 -   Trlycerides 0.0 - 149.0 mg/dL 322(H) 149 - 158.0(H) -   Hemoglobin A1c 4.6 - 6.5 % 5.8(H) - 5.7 5.8 5.6      Capillary Blood Glucose: No results found  for: GLUCAP   Exercise Target Goals: Exercise Program Goal: Individual exercise prescription set using results from initial 6 min walk test and THRR while considering  patient's activity barriers and safety.   Exercise Prescription Goal: Initial exercise prescription builds to 30-45 minutes a day of aerobic activity, 2-3 days per week.  Home exercise guidelines will be given to patient during program as part of exercise prescription that  the participant will acknowledge.  Activity Barriers & Risk Stratification: Activity Barriers & Cardiac Risk Stratification - 05/04/19 1226      Activity Barriers & Cardiac Risk Stratification   Activity Barriers  Back Problems;Deconditioning;Other (comment);Arthritis    Comments  bilateral foot neuropathy    Cardiac Risk Stratification  Moderate       6 Minute Walk: 6 Minute Walk    Row Name 05/04/19 1151 06/19/19 1359       6 Minute Walk   Phase  Initial  Discharge    Distance  1400 feet  1662 feet    Distance % Change  0 %  18.71 %    Distance Feet Change  0 ft  222 ft    Walk Time  4.58 minutes  6 minutes    # of Rest Breaks  1  0    MPH  2.65  3.15    METS  2.63  3.1    RPE  11  14    Perceived Dyspnea   0  0    VO2 Peak  9.2  10.76    Symptoms  Yes (comment)  Yes (comment)    Comments  Pt reported 4/10 Chest Pain at 4 minutes and 58 seconds. Test stopped. CP resolved at minute 7. Pt denied feeling SOB or dizzy.  Right Hip and Right Leg Pain 7/10    Resting HR  90 bpm  82 bpm    Resting BP  126/70  115/70    Exercise Oxygen Saturation  during 6 min walk  96 %  --    Max Ex. HR  111 bpm  122 bpm    Max Ex. BP  158/80  142/80    2 Minute Post BP  136/82  104/62       Oxygen Initial Assessment:   Oxygen Re-Evaluation:   Oxygen Discharge (Final Oxygen Re-Evaluation):   Initial Exercise Prescription: Initial Exercise Prescription - 05/04/19 1300      Date of Initial Exercise RX and Referring Provider   Date  05/04/19     Referring Provider  Dr. [name] MD    Expected Discharge Date  06/30/19      NuStep   Level  1    SPM  80    Minutes  15    METs  2.2      Arm Ergometer   Level  1.5    Watts  30    Minutes  15    METs  2      Prescription Details   Frequency (times per week)  3x    Duration  Progress to 10 minutes continuous walking  at current work load and total walking time to 30-45 min      Intensity   THRR 40-80% of Max Heartrate  58-115    Ratings of Perceived Exertion  11-13    Perceived Dyspnea  0-4      Progression   Progression  Continue progressive overload as per policy without signs/symptoms or physical distress.      Resistance Training   Training Prescription  Yes    Weight  2lbs    Reps  10-15       Perform Capillary Blood Glucose checks as needed.  Exercise Prescription Changes: Exercise Prescription Changes    Row Name 05/08/19 1100 05/29/19 1100 06/19/19 1412 06/23/19 1442       Response to Exercise   Blood Pressure (Admit)  106/52  110/54  110/64  110/64    Blood Pressure (Exercise)  132/80  158/72  168/60  168/60    Blood Pressure (Exit)  114/64  102/60  100/60  100/60    Heart Rate (Admit)  97 bpm  98 bpm  82 bpm  79 bpm    Heart Rate (Exercise)  105 bpm  113 bpm  122 bpm  111 bpm    Heart Rate (Exit)  86 bpm  90 bpm  89 bpm  79 bpm    Rating of Perceived Exertion (Exercise)  12  12  12  12     Perceived Dyspnea (Exercise)  --  --  0  0    Symptoms  None  None  None  None    Comments  Pt first day of exercise.   --  None  None    Duration  Continue with 30 min of aerobic exercise without signs/symptoms of physical distress.  Continue with 30 min of aerobic exercise without signs/symptoms of physical distress.  Continue with 30 min of aerobic exercise without signs/symptoms of physical distress.  Continue with 30 min of aerobic exercise without signs/symptoms of physical distress.    Intensity  THRR unchanged  THRR unchanged  THRR unchanged  THRR  unchanged      Progression   Progression  Continue to progress workloads to maintain intensity without signs/symptoms of physical distress.  Continue to progress workloads to maintain intensity without signs/symptoms of physical distress.  Continue to progress workloads to maintain intensity without signs/symptoms of physical distress.  Continue to progress workloads to maintain intensity without signs/symptoms of physical distress.    Average METs  2.1  2.6  3.4  4      Resistance Training   Training Prescription  Yes  Yes  Yes  Yes    Weight  2lbs  8 lbs.   8 lbs.   8 lbs.     Reps  10-15  10-15  10-15  10-15    Time  10 Minutes  10 Minutes  10 Minutes  10 Minutes      Interval Training   Interval Training  No  No  No  No      NuStep   Level  2  5  6  6     SPM  80  85  95  105    Minutes  15  15  15  15     METs  2.1  2.5  3.4  4.4      Arm Ergometer   Level  1.5  3  4.8  5.3    Watts  30  30  30  15     Minutes  15  15  15  15     METs  2  2  3.4  3.4      Home Exercise Plan   Plans to continue exercise at  --  Home (comment)  Home (comment)  Home (comment)    Frequency  --  Add 3 additional days to program exercise sessions.  Add 3 additional days to program exercise sessions.  Add 3 additional days to program exercise sessions.    Initial Home Exercises Provided  --  05/29/19  05/29/19  05/29/19       Exercise Comments: Exercise Comments    Row Name 05/08/19 1128 05/29/19 1131 06/29/19 1347       Exercise Comments  Pt first day of exercise. Pt tolerated exercise Rx well.  Reviewed HEP with  Pt. Pt understands goals for exercising at home.  Pt continues to respond well to exercise workloads. Pt is very appreciate for program. Pt will be graduting from rehab soon. Spoke with pt with about different options for exericse once he completes rehab. Pt has several options he plans to look into to help him continue with exercise.        Exercise Goals and Review: Exercise Goals     Row Name 05/04/19 1227             Exercise Goals   Increase Physical Activity  Yes       Intervention  Provide advice, education, support and counseling about physical activity/exercise needs.;Develop an individualized exercise prescription for aerobic and resistive training based on initial evaluation findings, risk stratification, comorbidities and participant's personal goals.       Expected Outcomes  Short Term: Attend rehab on a regular basis to increase amount of physical activity.;Long Term: Add in home exercise to make exercise part of routine and to increase amount of physical activity.;Long Term: Exercising regularly at least 3-5 days a week.       Increase Strength and Stamina  Yes       Intervention  Provide advice, education, support and counseling about physical activity/exercise needs.;Develop an individualized exercise prescription for aerobic and resistive training based on initial evaluation findings, risk stratification, comorbidities and participant's personal goals.       Expected Outcomes  Short Term: Increase workloads from initial exercise prescription for resistance, speed, and METs.;Short Term: Perform resistance training exercises routinely during rehab and add in resistance training at home;Long Term: Improve cardiorespiratory fitness, muscular endurance and strength as measured by increased METs and functional capacity (6MWT)       Able to understand and use rate of perceived exertion (RPE) scale  Yes       Intervention  Provide education and explanation on how to use RPE scale       Expected Outcomes  Short Term: Able to use RPE daily in rehab to express subjective intensity level;Long Term:  Able to use RPE to guide intensity level when exercising independently       Knowledge and understanding of Target Heart Rate Range (THRR)  Yes       Intervention  Provide education and explanation of THRR including how the numbers were predicted and where they are located for  reference       Expected Outcomes  Short Term: Able to state/look up THRR;Long Term: Able to use THRR to govern intensity when exercising independently;Short Term: Able to use daily as guideline for intensity in rehab       Able to check pulse independently  Yes       Intervention  Provide education and demonstration on how to check pulse in carotid and radial arteries.;Review the importance of being able to check your own pulse for safety during independent exercise       Expected Outcomes  Short Term: Able to explain why pulse checking is important during independent exercise;Long Term: Able to check pulse independently and accurately       Understanding of Exercise Prescription  Yes       Intervention  Provide education, explanation, and written materials on patient's individual exercise prescription       Expected Outcomes  Short Term: Able to explain program exercise prescription;Long Term: Able to explain home exercise prescription to exercise independently          Exercise Goals Re-Evaluation :  Exercise Goals Re-Evaluation    Row Name 05/08/19 1126 05/29/19 1129 06/29/19 1348         Exercise Goal Re-Evaluation   Exercise Goals Review  Increase Physical Activity;Increase Strength and Stamina;Able to understand and use rate of perceived exertion (RPE) scale;Knowledge and understanding of Target Heart Rate Range (THRR);Able to check pulse independently;Understanding of Exercise Prescription  Increase Physical Activity;Increase Strength and Stamina;Able to understand and use rate of perceived exertion (RPE) scale;Knowledge and understanding of Target Heart Rate Range (THRR);Able to check pulse independently;Understanding of Exercise Prescription  Increase Physical Activity;Increase Strength and Stamina;Able to understand and use rate of perceived exertion (RPE) scale;Knowledge and understanding of Target Heart Rate Range (THRR);Able to check pulse independently;Understanding of Exercise  Prescription     Comments  Pt first day of exercise in [facility] program. Pt tolerated exercise well and denied having CP at any time during exercise. Pt understands THRR, RPE scale, and exercise Rx.  Reviewed HEP with Pt. Pt understands home exercise guidelines, PRE scale, THRR, weather precautions, and end points of exercise. Pt has stretching and free weight guided materials. Pt stated he will continue walking 2-3 days per week for 35 minutes.  Pt is responding well to workloads. Pt puts forth good effort with exercise. Will continue to monitor. Pt has bike at home and continues to exercise 2-3 days in addition to rehab.     Expected Outcomes  Will continue to monitor and progress Pt as tolerated.  Will continue to monitor and progress Pt as tolerated.  Pt will continue to increase strength and stamina.        Discharge Exercise Prescription (Final Exercise Prescription Changes): Exercise Prescription Changes - 06/23/19 1442      Response to Exercise   Blood Pressure (Admit)  110/64    Blood Pressure (Exercise)  168/60    Blood Pressure (Exit)  100/60    Heart Rate (Admit)  79 bpm    Heart Rate (Exercise)  111 bpm    Heart Rate (Exit)  79 bpm    Rating of Perceived Exertion (Exercise)  12    Perceived Dyspnea (Exercise)  0    Symptoms  None    Comments  None    Duration  Continue with 30 min of aerobic exercise without signs/symptoms of physical distress.    Intensity  THRR unchanged      Progression   Progression  Continue to progress workloads to maintain intensity without signs/symptoms of physical distress.    Average METs  4      Resistance Training   Training Prescription  Yes    Weight  8 lbs.     Reps  10-15    Time  10 Minutes      Interval Training   Interval Training  No      NuStep   Level  6    SPM  105    Minutes  15    METs  4.4      Arm Ergometer   Level  5.3    Watts  15    Minutes  15    METs  3.4      Home Exercise Plan   Plans to continue exercise at   Home (comment)    Frequency  Add 3 additional days to program exercise sessions.    Initial Home Exercises Provided  05/29/19       Nutrition:  Target Goals: Understanding of nutrition guidelines, daily intake of sodium 1500mg , cholesterol 200mg ,  calories 30% from fat and 7% or less from saturated fats, daily to have 5 or more servings of fruits and vegetables.  Biometrics: Pre Biometrics - 05/04/19 1318      Pre Biometrics   Height  5' 5.75" (1.67 m)      Post Biometrics - 06/19/19 1401       Post  Biometrics   Height  5' 5.75" (1.67 m)    Weight  97.7 kg    Waist Circumference  48 inches    Hip Circumference  47 inches    Waist to Hip Ratio  1.02 %    BMI (Calculated)  35.03    Triceps Skinfold  16 mm    % Body Fat  35 %    Grip Strength  34 kg    Flexibility  0 in    Single Leg Stand  13.62 seconds       Nutrition Therapy Plan and Nutrition Goals: Nutrition Therapy & Goals - 05/15/19 1009      Nutrition Therapy   Diet  Heart Healthy      Personal Nutrition Goals   Nutrition Goal  Pt to build a healthy plate including vegetables, fruits, whole grains, and low-fat dairy products in a heart healthy meal plan.    Personal Goal #2  Pt able to name foods that affect blood glucose      Intervention Plan   Intervention  Prescribe, educate and counsel regarding individualized specific dietary modifications aiming towards targeted core components such as weight, hypertension, lipid management, diabetes, heart failure and other comorbidities.;Nutrition handout(s) given to patient.    Expected Outcomes  Short Term Goal: A plan has been developed with personal nutrition goals set during dietitian appointment.;Long Term Goal: Adherence to prescribed nutrition plan.       Nutrition Assessments: Nutrition Assessments - 06/27/19 0848      MEDFICTS Scores   Post Score  41       Nutrition Goals Re-Evaluation: Nutrition Goals Re-Evaluation    [place] Name 05/15/19 1010  06/27/19 0713           Goals   Current Weight  216 lb (98 kg)  215 lb (97.5 kg)      Nutrition Goal  Pt to build a healthy plate including vegetables, fruits, whole grains, and low-fat dairy products in a heart healthy meal plan.  Pt to build a healthy plate including vegetables, fruits, whole grains, and low-fat dairy products in a heart healthy meal plan.        Personal Goal #2 Re-Evaluation   Personal Goal #2  Pt able to name foods that affect blood glucose  Pt able to name foods that affect blood glucose         Nutrition Goals Re-Evaluation: Nutrition Goals Re-Evaluation    [place] Name 05/15/19 1010 06/27/19 0713           Goals   Current Weight  216 lb (98 kg)  215 lb (97.5 kg)      Nutrition Goal  Pt to build a healthy plate including vegetables, fruits, whole grains, and low-fat dairy products in a heart healthy meal plan.  Pt to build a healthy plate including vegetables, fruits, whole grains, and low-fat dairy products in a heart healthy meal plan.        Personal Goal #2 Re-Evaluation   Personal Goal #2  Pt able to name foods that affect blood glucose  Pt able to name foods that affect blood glucose  Nutrition Goals Discharge (Final Nutrition Goals Re-Evaluation): Nutrition Goals Re-Evaluation - 06/27/19 0713      Goals   Current Weight  215 lb (97.5 kg)    Nutrition Goal  Pt to build a healthy plate including vegetables, fruits, whole grains, and low-fat dairy products in a heart healthy meal plan.      Personal Goal #2 Re-Evaluation   Personal Goal #2  Pt able to name foods that affect blood glucose       Psychosocial: Target Goals: Acknowledge presence or absence of significant depression and/or stress, maximize coping skills, provide positive support system. Participant is able to verbalize types and ability to use techniques and skills needed for reducing stress and depression.  Initial Review & Psychosocial Screening: Initial Psych Review &  Screening - 05/04/19 1146      Initial Review   Current issues with  None Identified      Family Dynamics   Good Support System?  Yes    Comments  Mr. Spargo has a positive outlook and attitude. He denies psychosocial barriers to self health management and participation in cardiac rehab. He has a very small support system that includes his spouse. They do not have children. 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      Barriers   Psychosocial barriers to participate in program  There are no identifiable barriers or psychosocial needs.      Screening Interventions   Interventions  Encouraged to exercise       Quality of Life Scores: Quality of Life - 06/23/19 1423      Quality of Life Scores   Health/Function Pre  17.9 %    Health/Function Post  19.18 %    Health/Function % Change  7.15 %    Socioeconomic Pre  21.29 %    Socioeconomic Post  22.21 %    Socioeconomic % Change   4.32 %    Psych/Spiritual Pre  20.64 %    Psych/Spiritual Post  19.58 %    Psych/Spiritual % Change  -5.14 %    Family Pre  25.5 %    Family Post  22.88 %    Family % Change  -10.27 %    GLOBAL Pre  20.12 %    GLOBAL Post  20.42 %    GLOBAL % Change  1.49 %      Scores of 19 and below usually indicate a poorer quality of life in these areas.  A difference of  2-3 points is a clinically meaningful difference.  A difference of 2-3 points in the total score of the Quality of Life Index has been associated with significant improvement in overall quality of life, self-image, physical symptoms, and general health in studies assessing change in quality of life.  PHQ-9: Recent Review Flowsheet Data    Depression screen [facility] 2/9 05/04/2019 02/06/2019 11/02/2018 05/05/2018 02/02/2018   Decreased Interest 0 0 0 0 0   Down, Depressed, Hopeless 1 1 1  0 1   PHQ - 2 Score 1 1 1  0 1   Altered sleeping - 0 1 0 1   Tired, decreased energy - 1 2 0 1   Change in appetite - 0 2 0 2    Feeling bad or failure about yourself  - 0 0 0 1   Trouble concentrating - 0 0 0 0   Moving slowly or fidgety/restless - 0 0 0 1   Suicidal thoughts - 0 0  0 0   PHQ-9 Score - 2 6 0 7   Difficult doing work/chores - Not difficult at all Not difficult at all Not difficult at all Not difficult at all     Interpretation of Total Score  Total Score Depression Severity:  1-4 = Minimal depression, 5-9 = Mild depression, 10-14 = Moderate depression, 15-19 = Moderately severe depression, 20-27 = Severe depression   Psychosocial Evaluation and Intervention: Psychosocial Evaluation - 05/08/19 1049      Psychosocial Evaluation & Interventions   Interventions  Encouraged to exercise with the program and follow exercise prescription    Comments  [name] denies any psychosocial interventions. [name] enjoys Radio producer and watching movies.    Expected Outcomes  [name] will maintain a positive outlook with good coping skills.    Continue Psychosocial Services   No Follow up required       Psychosocial Re-Evaluation: Psychosocial Re-Evaluation    [place] Name 06/01/19 1117 06/23/19 1556           Psychosocial Re-Evaluation   Current issues with  None Identified  None Identified      Comments  [name] does not report any psychosocial interventions.  [name] does not report any psychosocial interventions.  He has been able to play poker, one of his hobbies.      Expected Outcomes  [name] will maintain a positive outlook with good coping skills.  [name] will maintain a positive outlook with good coping skills.      Interventions  Encouraged to attend Cardiac Rehabilitation for the exercise  Encouraged to attend Cardiac Rehabilitation for the exercise      Continue Psychosocial Services   No Follow up required  No Follow up required         Psychosocial Discharge (Final Psychosocial Re-Evaluation): Psychosocial Re-Evaluation - 06/23/19 1556      Psychosocial Re-Evaluation   Current issues with  None Identified     Comments  [name] does not report any psychosocial interventions.  He has been able to play poker, one of his hobbies.    Expected Outcomes  [name] will maintain a positive outlook with good coping skills.    Interventions  Encouraged to attend Cardiac Rehabilitation for the exercise    Continue Psychosocial Services   No Follow up required       Vocational Rehabilitation: Provide vocational rehab assistance to qualifying candidates.   Vocational Rehab Evaluation & Intervention: Vocational Rehab - 05/04/19 1146      Initial Vocational Rehab Evaluation & Intervention   Assessment shows need for Vocational Rehabilitation  No       Education: Education Goals: Education classes will be provided on a weekly basis, covering required topics. Participant will state understanding/return demonstration of topics presented.  Learning Barriers/Preferences: Learning Barriers/Preferences - 05/04/19 1146      Learning Barriers/Preferences   Learning Barriers  None    Learning Preferences  Individual Instruction       Education Topics: Count Your Pulse:  -Group instruction provided by verbal instruction, demonstration, patient participation and written materials to support subject.  Instructors address importance of being able to find your pulse and how to count your pulse when at home without a heart monitor.  Patients get hands on experience counting their pulse with staff help and individually.   Heart Attack, Angina, and Risk Factor Modification:  -Group instruction provided by verbal instruction, video, and written materials to support subject.  Instructors address signs and symptoms of angina and heart attacks.  Also discuss risk factors for heart disease and how to make changes to improve heart health risk factors.   Functional Fitness:  -Group instruction provided by verbal instruction, demonstration, patient participation, and written materials to support subject.  Instructors address  safety measures for doing things around the house.  Discuss how to get up and down off the floor, how to pick things up properly, how to safely get out of a chair without assistance, and balance training.   Meditation and Mindfulness:  -Group instruction provided by verbal instruction, patient participation, and written materials to support subject.  Instructor addresses importance of mindfulness and meditation practice to help reduce stress and improve awareness.  Instructor also leads participants through a meditation exercise.    Stretching for Flexibility and Mobility:  -Group instruction provided by verbal instruction, patient participation, and written materials to support subject.  Instructors lead participants through series of stretches that are designed to increase flexibility thus improving mobility.  These stretches are additional exercise for major muscle groups that are typically performed during regular warm up and cool down.   Hands Only CPR:  -Group verbal, video, and participation provides a basic overview of AHA guidelines for community CPR. Role-play of emergencies allow participants the opportunity to practice calling for help and chest compression technique with discussion of AED use.   Hypertension: -Group verbal and written instruction that provides a basic overview of hypertension including the most recent diagnostic guidelines, risk factor reduction with self-care instructions and medication management.    Nutrition I class: Heart Healthy Eating:  -Group instruction provided by PowerPoint slides, verbal discussion, and written materials to support subject matter. The instructor gives an explanation and review of the Therapeutic Lifestyle Changes diet recommendations, which includes a discussion on lipid goals, dietary fat, sodium, fiber, plant stanol/sterol esters, sugar, and the components of a well-balanced, healthy diet.   Nutrition II class: Lifestyle Skills:   -Group instruction provided by PowerPoint slides, verbal discussion, and written materials to support subject matter. The instructor gives an explanation and review of label reading, grocery shopping for heart health, heart healthy recipe modifications, and ways to make healthier choices when eating out.   Diabetes Question & Answer:  -Group instruction provided by PowerPoint slides, verbal discussion, and written materials to support subject matter. The instructor gives an explanation and review of diabetes co-morbidities, pre- and post-prandial blood glucose goals, pre-exercise blood glucose goals, signs, symptoms, and treatment of hypoglycemia and hyperglycemia, and foot care basics.   Diabetes Blitz:  -Group instruction provided by PowerPoint slides, verbal discussion, and written materials to support subject matter. The instructor gives an explanation and review of the physiology behind type 1 and type 2 diabetes, diabetes medications and rational behind using different medications, pre- and post-prandial blood glucose recommendations and Hemoglobin A1c goals, diabetes diet, and exercise including blood glucose guidelines for exercising safely.    Portion Distortion:  -Group instruction provided by PowerPoint slides, verbal discussion, written materials, and food models to support subject matter. The instructor gives an explanation of serving size versus portion size, changes in portions sizes over the last 20 years, and what consists of a serving from each food group.   Stress Management:  -Group instruction provided by verbal instruction, video, and written materials to support subject matter.  Instructors review role of stress in heart disease and how to cope with stress positively.     Exercising on Your Own:  -Group instruction provided by verbal instruction, power point, and written materials  to support subject.  Instructors discuss benefits of exercise, components of exercise,  frequency and intensity of exercise, and end points for exercise.  Also discuss use of nitroglycerin and activating EMS.  Review options of places to exercise outside of rehab.  Review guidelines for sex with heart disease.   Cardiac Drugs I:  -Group instruction provided by verbal instruction and written materials to support subject.  Instructor reviews cardiac drug classes: antiplatelets, anticoagulants, beta blockers, and statins.  Instructor discusses reasons, side effects, and lifestyle considerations for each drug class.   Cardiac Drugs II:  -Group instruction provided by verbal instruction and written materials to support subject.  Instructor reviews cardiac drug classes: angiotensin converting enzyme inhibitors (ACE-I), angiotensin II receptor blockers (ARBs), nitrates, and calcium channel blockers.  Instructor discusses reasons, side effects, and lifestyle considerations for each drug class.   Anatomy and Physiology of the Circulatory System:  Group verbal and written instruction and models provide basic cardiac anatomy and physiology, with the coronary electrical and arterial systems. Review of: AMI, Angina, Valve disease, Heart Failure, Peripheral Artery Disease, Cardiac Arrhythmia, Pacemakers, and the ICD.   Other Education:  -Group or individual verbal, written, or video instructions that support the educational goals of the cardiac rehab program.   Holiday Eating Survival Tips:  -Group instruction provided by PowerPoint slides, verbal discussion, and written materials to support subject matter. The instructor gives patients tips, tricks, and techniques to help them not only survive but enjoy the holidays despite the onslaught of food that accompanies the holidays.   Knowledge Questionnaire Score: Knowledge Questionnaire Score - 06/23/19 1423      Knowledge Questionnaire Score   Pre Score  22/24    Post Score  21/24       Core Components/Risk Factors/Patient Goals at  Admission: Personal Goals and Risk Factors at Admission - 05/08/19 1050      Core Components/Risk Factors/Patient Goals on Admission    Weight Management  Obesity    Hypertension  Yes    Intervention  Provide education on lifestyle modifcations including regular physical activity/exercise, weight management, moderate sodium restriction and increased consumption of fresh fruit, vegetables, and low fat dairy, alcohol moderation, and smoking cessation.;Monitor prescription use compliance.    Expected Outcomes  Short Term: Continued assessment and intervention until BP is < 140/34mm HG in hypertensive participants. < 130/91mm HG in hypertensive participants with diabetes, heart failure or chronic kidney disease.    Lipids  Yes    Intervention  Provide education and support for participant on nutrition & aerobic/resistive exercise along with prescribed medications to achieve LDL 70mg , HDL >40mg .    Expected Outcomes  Short Term: Participant states understanding of desired cholesterol values and is compliant with medications prescribed. Participant is following exercise prescription and nutrition guidelines.;Long Term: Cholesterol controlled with medications as prescribed, with individualized exercise RX and with personalized nutrition plan. Value goals: LDL < 70mg , HDL > 40 mg.       Core Components/Risk Factors/Patient Goals Review:  Goals and Risk Factor Review    Row Name 05/08/19 1051 06/01/19 1118 06/23/19 1556         Core Components/Risk Factors/Patient Goals Review   Personal Goals Review  Weight Management/Obesity;Hypertension;Lipids  Weight Management/Obesity;Hypertension;Lipids  Weight Management/Obesity;Hypertension;Lipids     Review  Pt with multiple CAD RFs willing to participate in CR exercise.  [name] would like to be able to walk longer and increase his strength and stamina.  Pt with multiple CAD RFs willing to participate in  CR exercise.  [name] is tolerating increased workloads very  well.  Pt with multiple CAD RFs willing to participate in CR exercise.  [name] is tolerating increased workloads.  He is enjoying CR and feels that he is getting stronger.  He graduates soon on 06/30/19.     Expected Outcomes  [name] will continue to participate in CR exercise to reduce his risk of CV disease.  [name] will continue to participate in CR exercise to reduce his risk of CV disease.  [name] will continue to participate in CR exercise to reduce his risk of CV disease.        Core Components/Risk Factors/Patient Goals at Discharge (Final Review):  Goals and Risk Factor Review - 06/23/19 1556      Core Components/Risk Factors/Patient Goals Review   Personal Goals Review  Weight Management/Obesity;Hypertension;Lipids    Review  Pt with multiple CAD RFs willing to participate in CR exercise.  [name] is tolerating increased workloads.  He is enjoying CR and feels that he is getting stronger.  He graduates soon on 06/30/19.    Expected Outcomes  [name] will continue to participate in CR exercise to reduce his risk of CV disease.       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  He started his new medication and did not have any CP with activity today.  30 Day ITP Review. [name] continues to tolerate exercise well.  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  30 Day ITP Review.  [name] is tolerating exercise with increased workloads.  He enjoys participating in CR and feels that he is getting stronger and is enjoying getting back into an exercise routine.  He will complete the program on 06/30/19.       Comments: See ITP Comments.

## 2019-06-30 ENCOUNTER — Encounter (HOSPITAL_COMMUNITY)
Admission: RE | Admit: 2019-06-30 | Discharge: 2019-06-30 | Disposition: A | Discharge status: Home / Self Care | Payer: PPO | Source: Ambulatory Visit | Attending: Cardiology | Admitting: Cardiology

## 2019-06-30 ENCOUNTER — Other Ambulatory Visit: Payer: Self-pay

## 2019-06-30 DIAGNOSIS — Z955 Presence of coronary angioplasty implant and graft: Secondary | ICD-10-CM

## 2019-07-04 DIAGNOSIS — J3081 Allergic rhinitis due to animal (cat) (dog) hair and dander: Secondary | ICD-10-CM | POA: Diagnosis not present

## 2019-07-04 DIAGNOSIS — J3089 Other allergic rhinitis: Secondary | ICD-10-CM | POA: Diagnosis not present

## 2019-07-04 DIAGNOSIS — J301 Allergic rhinitis due to pollen: Secondary | ICD-10-CM | POA: Diagnosis not present

## 2019-07-19 ENCOUNTER — Other Ambulatory Visit: Payer: Self-pay

## 2019-07-19 ENCOUNTER — Ambulatory Visit (INDEPENDENT_AMBULATORY_CARE_PROVIDER_SITE_OTHER): Payer: PPO | Admitting: Physician Assistant

## 2019-07-19 ENCOUNTER — Encounter: Payer: Self-pay | Admitting: Physician Assistant

## 2019-07-19 VITALS — BP 122/68 | HR 72 | Temp 96.9°F | Ht 65.0 in | Wt 213.8 lb

## 2019-07-19 DIAGNOSIS — I25810 Atherosclerosis of coronary artery bypass graft(s) without angina pectoris: Secondary | ICD-10-CM

## 2019-07-19 DIAGNOSIS — J3089 Other allergic rhinitis: Secondary | ICD-10-CM | POA: Diagnosis not present

## 2019-07-19 DIAGNOSIS — J301 Allergic rhinitis due to pollen: Secondary | ICD-10-CM | POA: Diagnosis not present

## 2019-07-19 DIAGNOSIS — G4733 Obstructive sleep apnea (adult) (pediatric): Secondary | ICD-10-CM

## 2019-07-19 DIAGNOSIS — I10 Essential (primary) hypertension: Secondary | ICD-10-CM

## 2019-07-19 DIAGNOSIS — J3081 Allergic rhinitis due to animal (cat) (dog) hair and dander: Secondary | ICD-10-CM | POA: Diagnosis not present

## 2019-07-19 DIAGNOSIS — Z9989 Dependence on other enabling machines and devices: Secondary | ICD-10-CM

## 2019-07-19 DIAGNOSIS — E785 Hyperlipidemia, unspecified: Secondary | ICD-10-CM

## 2019-07-19 NOTE — Progress Notes (Signed)
Cardiology Office Note:    Date:  07/21/2019   ID:  Joshua [name], DOB Sep 11, 1940, MRN [id number]  PCP:  [name], MD  Cardiologist:  [name], MD  Electrophysiologist:  None   Referring MD: [name], MD   Chief Complaint  Patient presents with  . Follow-up    seen for Dr. Percival Spanish    History of Present Illness:    Joshua [name] is a 79 y.o. male with a hx of CAD s/p CABG, HTN, HLD, pancreatitis and OSA on CPAP. Previous negative perfusion study in 2015.  He had chest pain in January 2020 and routine treadmill stress test showing no ischemia.    Due to recurrent chest discomfort, patient eventually underwent cardiac catheterization on 04/13/2019 that revealed severe triple-vessel coronary artery disease s/p 5 vessel bypass with 5 out of 5 patent bypass grafts.  The native LAD was occluded beyond the first diagonal branch.  Patient LIMA graft. The circumflex is occluded at the ostium.  The moderate caliber distal OM branch fills from the patent vein graft.  The small caliber more proximal OM fills retrograde from collaterals.  The native RCA is occluded proximally.  The vein graft to the distal RCA is patent. A DES was successfully placed to origin to proximal graft lesion saphenous graft to third marginal.  Patient had normal left ventricular systolic function.  He was discharged on aspirin and Plavix.  I saw the patient back after the procedure in January 2021, at which time he complained of worsening fatigue.  I stopped his metoprolol.  He was last seen by Roby Lofts on 05/04/2019 at which time he had 1 episode of chest pain during cardiac rehab.  Amlodipine 5 mg daily was added for antianginal purposes.  Patient presents today for follow-up.  He has not had any further chest discomfort since the last office visit.  Overall he is doing well and has been able to do everything at home.  He has received both COVID-19 vaccine shots.  On physical exam, he has no  lower extremity edema, he also denies any orthopnea or PND.  He can follow-up with Dr. Percival Spanish in September as previously recommended.   Past Medical History:  Diagnosis Date  . Acute medial meniscal tear   . Anemia   . Arthritis    "middle finger right hand; right knee; neck" (02/06/2014)  . Asthma   . Bladder cancer ([place]) 04/2010   "cauterized during prostate OR"  . CAD (coronary artery disease)    CABG 2001  . Depression   . Diverticulosis   . DJD (degenerative joint disease)    BACK  . GERD (gastroesophageal reflux disease)   . H/O inguinal hernia repair 12/2018  . History of gout   . Hyperlipidemia   . Hypertension   . IBS (irritable bowel syndrome)   . Obesity   . OSA (obstructive sleep apnea)    USES CPAP   . Pancreatitis ~ 1980  . Prostate cancer ([place]) 04/2010    Past Surgical History:  Procedure Laterality Date  . ANTERIOR CERVICAL DECOMP/DISCECTOMY FUSION  05/26/2006   PLATE PLACED  . CARDIAC CATHETERIZATION  11/20/1999  . CORONARY ARTERY BYPASS GRAFT  11/20/1999   SVG-RI1-RI2, SVG-OM, SVG-dRCA  . CORONARY STENT INTERVENTION N/A 04/13/2019   Procedure: CORONARY STENT INTERVENTION;  Surgeon: Burnell Blanks, MD;  Location: [facility];  Service: Cardiovascular;  Laterality: N/A;  . INGUINAL HERNIA REPAIR Left 01/02/2019   Procedure: OPEN  LEFT INGUINAL HERNIA REPAIR WITH MESH;  Surgeon: Johnathan Hausen, MD;  Location: [facility];  Service: General;  Laterality: Left;  . KNEE ARTHROSCOPY Right 1992; 04/2008  . LAPAROSCOPIC CHOLECYSTECTOMY  2007  . LEFT HEART CATH AND CORS/GRAFTS ANGIOGRAPHY N/A 04/13/2019   Procedure: LEFT HEART CATH AND CORS/GRAFTS ANGIOGRAPHY;  Surgeon: Burnell Blanks, MD;  Location: [facility];  Service: Cardiovascular;  Laterality: N/A;  . NASAL SEPTUM SURGERY Right 2007  . ROBOT ASSISTED LAPAROSCOPIC RADICAL PROSTATECTOMY  04/2010  . THYROIDECTOMY, PARTIAL  1980    Current Medications: Current Meds  Medication Sig    . acetaminophen (TYLENOL) 500 MG tablet Take 1,000 mg by mouth as needed for moderate pain or headache.   . albuterol (PROAIR HFA) 108 (90 BASE) MCG/ACT inhaler Inhale 1-2 puffs into the lungs every 6 (six) hours as needed for wheezing or shortness of breath. (Patient taking differently: Inhale 1-2 puffs into the lungs as needed for wheezing or shortness of breath. )  . amLODipine (NORVASC) 5 MG tablet Take 1 tablet (5 mg total) by mouth daily.  [name] aspirin EC 81 MG tablet Take 81 mg by mouth at bedtime.   . Cholecalciferol (VITAMIN D3) 50 MCG (2000 UT) TABS Take 2,000 Units by mouth daily.  . clopidogrel (PLAVIX) 75 MG tablet Take 1 tablet (75 mg total) by mouth daily with breakfast.  . diazepam (VALIUM) 5 MG tablet Take 1 tablet (5 mg total) by mouth at bedtime as needed. (Patient taking differently: Take 5 mg by mouth at bedtime as needed (sleep). )  . Evolocumab (REPATHA SURECLICK) [id number] MG/ML SOAJ Inject 140 mg into the skin every 14 (fourteen) days.  [name] FLOVENT HFA 110 MCG/ACT inhaler Inhale 1 puff into the lungs 2 (two) times daily.   . fluticasone (FLONASE) 50 MCG/ACT nasal spray Place 1 spray into both nostrils 2 (two) times daily.   . folic acid (FOLVITE) Q000111Q MCG tablet Take 800 mcg by mouth daily.   [name] gabapentin (NEURONTIN) 300 MG capsule Take 1 cap in AM, 1 cap at noon, 2 caps at bedtime (Patient taking differently: Take 300-600 mg by mouth See admin instructions. Take 1 capsule (300 mg) by mouth in the morning, take 1 capsule (300 mg) by mouth with lunch, & take 2 capsules (600 mg) by mouth at bedtime.)  . Glucosamine-Chondroitin (MOVE FREE PO) Take 1 tablet by mouth daily.  [name] ketoconazole (NIZORAL) 2 % cream Apply 1 application topically 2 (two) times daily as needed for irritation.   [name] loratadine (CLARITIN) 10 MG tablet Take 10 mg by mouth at bedtime.   [name] losartan (COZAAR) 100 MG tablet TAKE 1 TABLET BY MOUTH EVERY DAY (Patient taking differently: Take 100 mg by mouth at bedtime. )  .  Miconazole Nitrate (LOTRIMIN AF DEODORANT POWDER) 2 % AERP Apply 1 spray topically 2 (two) times daily.  . Misc Natural Products (TART CHERRY ADVANCED PO) Take 1 tablet by mouth daily.  . ondansetron (ZOFRAN ODT) 8 MG disintegrating tablet Take 1 tablet (8 mg total) by mouth every 8 (eight) hours as needed for nausea or vomiting.  . pantoprazole (PROTONIX) 40 MG tablet TAKE 1 TABLET EVERY DAY  . polyethylene glycol powder (GLYCOLAX/MIRALAX) powder Take 17 g by mouth daily. (Patient taking differently: Take 17 g by mouth 2 (two) times daily. )  . Probiotic Product (ALIGN PO) Take 7.5 g by mouth daily.  . vitamin B-12 (CYANOCOBALAMIN) 1000 MCG tablet Take 1,000 mcg by mouth daily.  . [DISCONTINUED]  hydrochlorothiazide (HYDRODIURIL) 25 MG tablet Take 0.5 tablets (12.5 mg total) by mouth daily. (Patient taking differently: Take 25 mg by mouth daily. )     Allergies:   Benadryl [diphenhydramine], Bromfed, Clarithromycin, Codeine, Doxycycline hyclate, Modafinil, Oxycodone-acetaminophen, Promethazine hcl, Quinolones, Telithromycin, Hydrocodone-acetaminophen, Statins, and Sulfonamide derivatives   Social History   Socioeconomic History  . Marital status: Married    Spouse name: Not on file  . Number of children: Not on file  . Years of education: Not on file  . Highest education level: Not on file  Occupational History  . Occupation: retired    Fish farm manager: RETIRED  Tobacco Use  . Smoking status: Former Smoker    Packs/day: 1.00    Years: 35.00    Pack years: 35.00    Types: Cigarettes    Quit date: 06/16/1992    Years since quitting: 27.1  . Smokeless tobacco: Never Used  Substance and Sexual Activity  . Alcohol use: Yes    Alcohol/week: 7.0 standard drinks    Types: 7 Shots of liquor per week    Comment: mixed drink Q DAY  . Drug use: No  . Sexual activity: Not Currently    Partners: Female  Other Topics Concern  . Not on file  Social History Narrative   Married 42 years in 2015. No  kids (mumps at age 53)      Retired from [employer] in Engineer, technical sales   Volunteers at [place]: poker, movies and tv, staying active      Right handed   2 level home with steps he uses   Social Determinants of Radio broadcast assistant Strain: [place]   . Difficulty of Paying Living Expenses: Not hard at all  Food Insecurity:   . Worried About Charity fundraiser in the Last Year:   . Arboriculturist in the Last Year:   Transportation Needs: No Transportation Needs  . Lack of Transportation (Medical): No  . Lack of Transportation (Non-Medical): No  Physical Activity: Unknown  . Days of Exercise per Week: 5 days  . Minutes of Exercise per Session: Not on file  Stress:   . Feeling of Stress :   Social Connections:   . Frequency of Communication with Friends and Family:   . Frequency of Social Gatherings with Friends and Family:   . Attends Religious Services:   . Active Member of Clubs or Organizations:   . Attends Archivist Meetings:   [name] Marital Status:      Family History: The patient's family history includes Diabetes in his brother; Heart disease in his father and mother; Hyperlipidemia in his father and mother.  ROS:   Please see the history of present illness.     All other systems reviewed and are negative.  EKGs/Labs/Other Studies Reviewed:    The following studies were reviewed today:  Cath 04/13/2019  Prox RCA lesion is 100% stenosed.  SVG graft was visualized by angiography.  The graft exhibits no disease.  Ost Cx to Prox Cx lesion is 100% stenosed.  SVG graft was visualized by angiography.  SVG graft was visualized by angiography.  Prox LAD to Mid LAD lesion is 100% stenosed.  LIMA graft was visualized by angiography.  The graft exhibits no disease.  Origin to Prox Graft lesion is 80% stenosed.  A drug-eluting stent was successfully placed using a STENT RESOLUTE ONYX 3.0X18.  Post intervention, there is a 0% residual  stenosis.  The left ventricular systolic function is normal.  LV end diastolic pressure is normal.  The left ventricular ejection fraction is 55-65% by visual estimate.  There is no mitral valve regurgitation.   1. 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 The native LAD is occluded just beyond the first diagonal branch. The mid and distal LAD fills from the patent LIMA graft 3. The ramus intermediate branches fill from the sequential vein graft which is patent with no disease 4. The Circumflex is occluded at the ostium. The moderate caliber distal OM branch fills from the patent vein graft. The small caliber more proximal OM fills retrograde from collaterals.  5. The native RCA is occluded proximally. The vein graft to the distal RCA is patent 5. Normal LV systolic function  Recommendations: Will monitor overnight. Will plan DAPT with ASA and Plavix for at least 6 months.    EKG:  EKG is not ordered today.    Recent Labs: 10/27/2018: TSH 1.31 04/17/2019: Hemoglobin 14.0; Platelets 247 06/06/2019: ALT 19; BUN 20; Creatinine, Ser 1.26; Potassium 4.1; Sodium 139  Recent Lipid Panel    Component Value Date/Time   CHOL 96 11/03/2018 0806   CHOL 81 (L) 12/30/2016 0848   TRIG 158.0 (H) 11/03/2018 0806   HDL 46.20 11/03/2018 0806   HDL 42 12/30/2016 0848   CHOLHDL 2 11/03/2018 0806   VLDL 31.6 11/03/2018 0806   LDLCALC 18 11/03/2018 0806   LDLCALC 16 10/01/2017 1703   LDLDIRECT 115.0 06/04/2016 0919    Physical Exam:    VS:  BP 122/68   Pulse 72   Temp (!) 96.9 F (36.1 C) Comment: Forehead  Ht 5\' 5"  (1.651 m)   Wt 213 lb 12.8 oz (97 kg)   SpO2 95%   BMI 35.58 kg/m     Wt Readings from Last 3 Encounters:  07/19/19 213 lb 12.8 oz (97 kg)  06/19/19 215 lb 6.2 oz (97.7 kg)  06/06/19 215 lb (97.5 kg)     GEN:  Well nourished, well developed in no acute distress HEENT: Normal NECK: No JVD; No carotid bruits LYMPHATICS: No  lymphadenopathy CARDIAC: RRR, no murmurs, rubs, gallops RESPIRATORY:  Clear to auscultation without rales, wheezing or rhonchi  ABDOMEN: Soft, non-tender, non-distended MUSCULOSKELETAL:  No edema; No deformity  SKIN: Warm and dry NEUROLOGIC:  Alert and oriented x 3 PSYCHIATRIC:  Normal affect   ASSESSMENT:    1. Coronary artery disease involving coronary bypass graft of native heart without angina pectoris   2. Essential hypertension   3. Hyperlipidemia LDL goal <70   4. OSA on CPAP    PLAN:    In order of problems listed above:  1. CAD s/p CABG: Patient underwent cardiac catheterization recently on 04/13/2019 and had PCI DES to SVG to OM 3.  Since then, he had 1 more episode of chest pain and was placed on amlodipine for antianginal purposes.  He has not had any further chest discomfort.  Continue aspirin and Plavix  2. Hypertension: Blood pressure stable on current therapy  3. Hyperlipidemia: Continue Repatha  4. Obstructive sleep apnea: On CPAP therapy.   Medication Adjustments/Labs and Tests Ordered: Current medicines are reviewed at length with the patient today.  Concerns regarding medicines are outlined above.  No orders of the defined types were placed in this encounter.  No orders of the defined types were placed in this encounter.   Patient Instructions  Medication Instructions:  Your physician recommends  that you continue on your current medications as directed. Please refer to the Current Medication list given to you today.  *If you need a refill on your cardiac medications before your next appointment, please call your pharmacy*  Lab Work: NONE ordered at this time of appointment   If you have labs (blood work) drawn today and your tests are completely normal, you will receive your results only by: [name] MyChart Message (if you have MyChart) OR . A paper copy in the mail If you have any lab test that is abnormal or we need to change your treatment, we will call  you to review the results.  Testing/Procedures: NONE ordered at this time of appointment   Follow-Up: At [facility], you and your health needs are our priority.  As part of our continuing mission to provide you with exceptional heart care, we have created designated Provider Care Teams.  These Care Teams include your primary Cardiologist (physician) and Advanced Practice Providers (APPs -  Physician Assistants and Nurse Practitioners) who all work together to provide you with the care you need, when you need it.  Your next appointment:   5 month(s)  The format for your next appointment:   In Person  Provider:   [name], MD  Other Instructions      Signed, Almyra Deforest, [place]  07/21/2019 11:12 PM    [place]

## 2019-07-19 NOTE — Patient Instructions (Signed)
Medication Instructions:  Your physician recommends that you continue on your current medications as directed. Please refer to the Current Medication list given to you today.  *If you need a refill on your cardiac medications before your next appointment, please call your pharmacy*  Lab Work: NONE ordered at this time of appointment   If you have labs (blood work) drawn today and your tests are completely normal, you will receive your results only by: [name] MyChart Message (if you have MyChart) OR . A paper copy in the mail If you have any lab test that is abnormal or we need to change your treatment, we will call you to review the results.  Testing/Procedures: NONE ordered at this time of appointment   Follow-Up: At [facility], you and your health needs are our priority.  As part of our continuing mission to provide you with exceptional heart care, we have created designated Provider Care Teams.  These Care Teams include your primary Cardiologist (physician) and Advanced Practice Providers (APPs -  Physician Assistants and Nurse Practitioners) who all work together to provide you with the care you need, when you need it.  Your next appointment:   5 month(s)  The format for your next appointment:   In Person  Provider:   [name], MD  Other Instructions

## 2019-07-20 ENCOUNTER — Other Ambulatory Visit: Payer: Self-pay | Admitting: Family Medicine

## 2019-07-21 ENCOUNTER — Encounter: Payer: Self-pay | Admitting: Physician Assistant

## 2019-07-21 NOTE — Progress Notes (Signed)
Discharge Progress Report  Patient Details  Name: Joshua [name] MRN: [id number] Date of Birth: May 12, 1940 Referring Provider:     CARDIAC REHAB PHASE II ORIENTATION from 05/04/2019 in [facility]  Referring Provider  Dr. [name] MD       Number of Visits: 22  Reason for Discharge:  Patient reached a stable level of exercise.  Smoking History:  Social History   Tobacco Use  Smoking Status Former Smoker  . Packs/day: 1.00  . Years: 35.00  . Pack years: 35.00  . Types: Cigarettes  . Quit date: 06/16/1992  . Years since quitting: 27.1  Smokeless Tobacco Never Used    Diagnosis:  Status post coronary artery stent placement  ADL UCSD:   Initial Exercise Prescription: Initial Exercise Prescription - 05/04/19 1300      Date of Initial Exercise RX and Referring Provider   Date  05/04/19    Referring Provider  Dr. [name] MD    Expected Discharge Date  06/30/19      NuStep   Level  1    SPM  80    Minutes  15    METs  2.2      Arm Ergometer   Level  1.5    Watts  30    Minutes  15    METs  2      Prescription Details   Frequency (times per week)  3x    Duration  Progress to 10 minutes continuous walking  at current work load and total walking time to 30-45 min      Intensity   THRR 40-80% of Max Heartrate  58-115    Ratings of Perceived Exertion  11-13    Perceived Dyspnea  0-4      Progression   Progression  Continue progressive overload as per policy without signs/symptoms or physical distress.      Resistance Training   Training Prescription  Yes    Weight  2lbs    Reps  10-15       Discharge Exercise Prescription (Final Exercise Prescription Changes): Exercise Prescription Changes - 06/30/19 1549      Response to Exercise   Blood Pressure (Admit)  102/56    Blood Pressure (Exercise)  140/68    Blood Pressure (Exit)  102/60    Heart Rate (Admit)  93 bpm    Heart Rate (Exercise)  105 bpm     Heart Rate (Exit)  92 bpm    Rating of Perceived Exertion (Exercise)  15    Perceived Dyspnea (Exercise)  0    Symptoms  None    Comments  None    Duration  Continue with 30 min of aerobic exercise without signs/symptoms of physical distress.    Intensity  THRR unchanged      Progression   Progression  Continue to progress workloads to maintain intensity without signs/symptoms of physical distress.    Average METs  4.3      Resistance Training   Training Prescription  Yes    Weight  8 lbs.     Reps  10-15    Time  10 Minutes      Interval Training   Interval Training  No      NuStep   Level  6    SPM  95    Minutes  15    METs  3.1      Arm Ergometer   Level  5.3  Watts  14    Minutes  15    METs  5.5      Home Exercise Plan   Plans to continue exercise at  Home (comment)    Frequency  Add 3 additional days to program exercise sessions.    Initial Home Exercises Provided  05/29/19       Functional Capacity: 6 Minute Walk    Row Name 05/04/19 1151 06/19/19 1359       6 Minute Walk   Phase  Initial  Discharge    Distance  1400 feet  1662 feet    Distance % Change  0 %  18.71 %    Distance Feet Change  0 ft  222 ft    Walk Time  4.58 minutes  6 minutes    # of Rest Breaks  1  0    MPH  2.65  3.15    METS  2.63  3.1    RPE  11  14    Perceived Dyspnea   0  0    VO2 Peak  9.2  10.76    Symptoms  Yes (comment)  Yes (comment)    Comments  Pt reported 4/10 Chest Pain at 4 minutes and 58 seconds. Test stopped. CP resolved at minute 7. Pt denied feeling SOB or dizzy.  Right Hip and Right Leg Pain 7/10    Resting HR  90 bpm  82 bpm    Resting BP  126/70  115/70    Exercise Oxygen Saturation  during 6 min walk  96 %  --    Max Ex. HR  111 bpm  122 bpm    Max Ex. BP  158/80  142/80    2 Minute Post BP  136/82  104/62       Psychological, QOL, Others - Outcomes: PHQ 2/9: Depression screen [facility] 2/9 06/30/2019 05/04/2019 02/06/2019 11/02/2018 05/05/2018  Decreased  Interest 0 0 0 0 0  Down, Depressed, Hopeless '1 1 1 1 ' 0  PHQ - 2 Score '1 1 1 1 ' 0  Altered sleeping - - 0 1 0  Tired, decreased energy - - 1 2 0  Change in appetite - - 0 2 0  Feeling bad or failure about yourself  - - 0 0 0  Trouble concentrating - - 0 0 0  Moving slowly or fidgety/restless - - 0 0 0  Suicidal thoughts - - 0 0 0  PHQ-9 Score - - 2 6 0  Difficult doing work/chores - - Not difficult at all Not difficult at all Not difficult at all  Some recent data might be hidden    Quality of Life: Quality of Life - 06/23/19 1423      Quality of Life Scores   Health/Function Pre  17.9 %    Health/Function Post  19.18 %    Health/Function % Change  7.15 %    Socioeconomic Pre  21.29 %    Socioeconomic Post  22.21 %    Socioeconomic % Change   4.32 %    Psych/Spiritual Pre  20.64 %    Psych/Spiritual Post  19.58 %    Psych/Spiritual % Change  -5.14 %    Family Pre  25.5 %    Family Post  22.88 %    Family % Change  -10.27 %    GLOBAL Pre  20.12 %    GLOBAL Post  20.42 %    GLOBAL % Change  1.49 %  Personal Goals: Goals established at orientation with interventions provided to work toward goal. Personal Goals and Risk Factors at Admission - 05/08/19 1050      Core Components/Risk Factors/Patient Goals on Admission    Weight Management  Obesity    Hypertension  Yes    Intervention  Provide education on lifestyle modifcations including regular physical activity/exercise, weight management, moderate sodium restriction and increased consumption of fresh fruit, vegetables, and low fat dairy, alcohol moderation, and smoking cessation.;Monitor prescription use compliance.    Expected Outcomes  Short Term: Continued assessment and intervention until BP is < 140/11m HG in hypertensive participants. < 130/843mHG in hypertensive participants with diabetes, heart failure or chronic kidney disease.    Lipids  Yes    Intervention  Provide education and support for participant on  nutrition & aerobic/resistive exercise along with prescribed medications to achieve LDL <7092mHDL >92m62m  Expected Outcomes  Short Term: Participant states understanding of desired cholesterol values and is compliant with medications prescribed. Participant is following exercise prescription and nutrition guidelines.;Long Term: Cholesterol controlled with medications as prescribed, with individualized exercise RX and with personalized nutrition plan. Value goals: LDL < 70mg64mL > 40 mg.        Personal Goals Discharge: Goals and Risk Factor Review    Row Name 05/08/19 1051 06/01/19 1118 06/23/19 1556 06/30/19 1216       Core Components/Risk Factors/Patient Goals Review   Personal Goals Review  Weight Management/Obesity;Hypertension;Lipids  Weight Management/Obesity;Hypertension;Lipids  Weight Management/Obesity;Hypertension;Lipids  Weight Management/Obesity;Hypertension;Lipids    Review  Pt with multiple CAD RFs willing to participate in CR exercise.  Joshua Abbe Amsterdamd like to be able to walk longer and increase his strength and stamina.  Pt with multiple CAD RFs willing to participate in CR exercise.  Joshua Abbe Amsterdamolerating increased workloads very well.  Pt with multiple CAD RFs willing to participate in CR exercise.  Joshua Abbe Amsterdamolerating increased workloads.  He is enjoying CR and feels that he is getting stronger.  He graduates soon on 06/30/19.  [place]ua [place] [facility] [employer] [facility] sessions.  Joshua tolerate exercise well with increased workloads.  He felt that he increased his strength and enjoyed returning to an exercise routine.    Expected Outcomes  Joshua [name] continue to participate in CR exercise to reduce his risk of CV disease.  Joshua [name] continue to participate in CR exercise to reduce his risk of CV disease.  Joshua [name] continue to participate in CR exercise to reduce his risk of CV disease.  Joshua [name] continue to participate in exercise, nutrition, and lifestyle modification opportunities to  reduce his risk of CV disease.  He plans to work out at the [employer]   Exercise Goals and Review: Exercise Goals    Row Name 05/04/19 1227             Exercise Goals   Increase Physical Activity  Yes       Intervention  Provide advice, education, support and counseling about physical activity/exercise needs.;Develop an individualized exercise prescription for aerobic and resistive training based on initial evaluation findings, risk stratification, comorbidities and participant's personal goals.       Expected Outcomes  Short Term: Attend rehab on a regular basis to increase amount of physical activity.;Long Term: Add in home exercise to make exercise part of routine and to increase amount of physical activity.;Long Term: Exercising regularly at least 3-5 days a week.  Increase Strength and Stamina  Yes       Intervention  Provide advice, education, support and counseling about physical activity/exercise needs.;Develop an individualized exercise prescription for aerobic and resistive training based on initial evaluation findings, risk stratification, comorbidities and participant's personal goals.       Expected Outcomes  Short Term: Increase workloads from initial exercise prescription for resistance, speed, and METs.;Short Term: Perform resistance training exercises routinely during rehab and add in resistance training at home;Long Term: Improve cardiorespiratory fitness, muscular endurance and strength as measured by increased METs and functional capacity (6MWT)       Able to understand and use rate of perceived exertion (RPE) scale  Yes       Intervention  Provide education and explanation on how to use RPE scale       Expected Outcomes  Short Term: Able to use RPE daily in rehab to express subjective intensity level;Long Term:  Able to use RPE to guide intensity level when exercising independently       Knowledge and understanding of Target Heart Rate Range (THRR)  Yes        Intervention  Provide education and explanation of THRR including how the numbers were predicted and where they are located for reference       Expected Outcomes  Short Term: Able to state/look up THRR;Long Term: Able to use THRR to govern intensity when exercising independently;Short Term: Able to use daily as guideline for intensity in rehab       Able to check pulse independently  Yes       Intervention  Provide education and demonstration on how to check pulse in carotid and radial arteries.;Review the importance of being able to check your own pulse for safety during independent exercise       Expected Outcomes  Short Term: Able to explain why pulse checking is important during independent exercise;Long Term: Able to check pulse independently and accurately       Understanding of Exercise Prescription  Yes       Intervention  Provide education, explanation, and written materials on patient's individual exercise prescription       Expected Outcomes  Short Term: Able to explain program exercise prescription;Long Term: Able to explain home exercise prescription to exercise independently          Exercise Goals Re-Evaluation: Exercise Goals Re-Evaluation    Row Name 05/08/19 1126 05/29/19 1129 06/29/19 1348 07/05/19 1553       Exercise Goal Re-Evaluation   Exercise Goals Review  Increase Physical Activity;Increase Strength and Stamina;Able to understand and use rate of perceived exertion (RPE) scale;Knowledge and understanding of Target Heart Rate Range (THRR);Able to check pulse independently;Understanding of Exercise Prescription  Increase Physical Activity;Increase Strength and Stamina;Able to understand and use rate of perceived exertion (RPE) scale;Knowledge and understanding of Target Heart Rate Range (THRR);Able to check pulse independently;Understanding of Exercise Prescription  Increase Physical Activity;Increase Strength and Stamina;Able to understand and use rate of perceived exertion  (RPE) scale;Knowledge and understanding of Target Heart Rate Range (THRR);Able to check pulse independently;Understanding of Exercise Prescription  Increase Physical Activity;Increase Strength and Stamina;Able to understand and use rate of perceived exertion (RPE) scale;Knowledge and understanding of Target Heart Rate Range (THRR);Able to check pulse independently;Understanding of Exercise Prescription    Comments  Pt first day of exercise in CR program. Pt tolerated exercise well and denied having CP at any time during exercise. Pt understands THRR, RPE scale, and exercise Rx.  Reviewed  HEP with Pt. Pt understands home exercise guidelines, PRE scale, THRR, weather precautions, and end points of exercise. Pt has stretching and free weight guided materials. Pt stated he will continue walking 2-3 days per week for 35 minutes.  Pt is responding well to workloads. Pt puts forth good effort with exercise. Will continue to monitor. Pt has bike at home and continues to exercise 2-3 days in addition to rehab.  Pt completed 22 sessions of cardiac rehab. Pt increased functional capacity by 18.71%. Pt increased post 6MWT distance by 227f. Pt also increased muscular strength and was able to use 8lb hand weights toward the end of rehab.    Expected Outcomes  Will continue to monitor and progress Pt as tolerated.  Will continue to monitor and progress Pt as tolerated.  Pt will continue to increase strength and stamina.  Pt will continue to exercise 4-6 days a week. Pt will walk 30-45 minutes. Pt is also considering joining local gym facility.       Nutrition & Weight - Outcomes: Pre Biometrics - 05/04/19 1318      Pre Biometrics   Height  5' 5.75" (1.67 m)      Post Biometrics - 06/19/19 1401       Post  Biometrics   Height  5' 5.75" (1.67 m)    Weight  97.7 kg    Waist Circumference  48 inches    Hip Circumference  47 inches    Waist to Hip Ratio  1.02 %    BMI (Calculated)  35.03    Triceps Skinfold  16  mm    % Body Fat  35 %    Grip Strength  34 kg    Flexibility  0 in    Single Leg Stand  13.62 seconds       Nutrition: Nutrition Therapy & Goals - 05/15/19 1009      Nutrition Therapy   Diet  Heart Healthy      Personal Nutrition Goals   Nutrition Goal  Pt to build a healthy plate including vegetables, fruits, whole grains, and low-fat dairy products in a heart healthy meal plan.    Personal Goal #2  Pt able to name foods that affect blood glucose      Intervention Plan   Intervention  Prescribe, educate and counsel regarding individualized specific dietary modifications aiming towards targeted core components such as weight, hypertension, lipid management, diabetes, heart failure and other comorbidities.;Nutrition handout(s) given to patient.    Expected Outcomes  Short Term Goal: A plan has been developed with personal nutrition goals set during dietitian appointment.;Long Term Goal: Adherence to prescribed nutrition plan.       Nutrition Discharge: Nutrition Assessments - 06/27/19 0848      MEDFICTS Scores   Post Score  41       Education Questionnaire Score: Knowledge Questionnaire Score - 06/23/19 1423      Knowledge Questionnaire Score   Pre Score  22/24    Post Score  21/24       Goals reviewed with patient; copy given to patient.Pt graduated from cardiac rehab program on 06/30/19 with completion of 22 exercise sessions in Phase II. Pt maintained good attendance and progressed nicely during his participation in rehab as evidenced by increased MET level. Joshua increased his distance on his post exercise walk test and lost 1.7 kg  Medication list reconciled. Repeat  PHQ score- 0 .  Pt has made significant lifestyle changes and should be commended  for his success. Pt feels he has achieved his goals during cardiac rehab.   Pt plans to continue exercise at the [facility]. We are proud of Joshua's progress.Barnet Pall, RN,BSN 07/21/2019 4:14 PM

## 2019-07-24 ENCOUNTER — Telehealth: Payer: Self-pay | Admitting: Pulmonary Disease

## 2019-07-24 NOTE — Telephone Encounter (Signed)
Called [facility] because our office does not do the prior authorizations for CPAPs, this is done by the DME companies.  Spoke with Joshua [name] at [place], who said that she would work on getting this prior authorization done for pt today.    Spoke with pt to make aware, advised pt to follow up with [facility] regarding prior auth.  Pt expressed understanding.  Nothing further needed at this time- will close encounter.

## 2019-07-25 ENCOUNTER — Other Ambulatory Visit: Payer: Self-pay | Admitting: Medical

## 2019-07-25 DIAGNOSIS — G4733 Obstructive sleep apnea (adult) (pediatric): Secondary | ICD-10-CM | POA: Diagnosis not present

## 2019-07-26 DIAGNOSIS — G4733 Obstructive sleep apnea (adult) (pediatric): Secondary | ICD-10-CM | POA: Diagnosis not present

## 2019-07-29 ENCOUNTER — Other Ambulatory Visit: Payer: Self-pay | Admitting: Medical

## 2019-08-02 DIAGNOSIS — J3081 Allergic rhinitis due to animal (cat) (dog) hair and dander: Secondary | ICD-10-CM | POA: Diagnosis not present

## 2019-08-02 DIAGNOSIS — J3089 Other allergic rhinitis: Secondary | ICD-10-CM | POA: Diagnosis not present

## 2019-08-02 DIAGNOSIS — J301 Allergic rhinitis due to pollen: Secondary | ICD-10-CM | POA: Diagnosis not present

## 2019-08-07 ENCOUNTER — Other Ambulatory Visit: Payer: Self-pay

## 2019-08-07 ENCOUNTER — Encounter: Payer: Self-pay | Admitting: Family Medicine

## 2019-08-07 ENCOUNTER — Ambulatory Visit: Payer: PPO | Admitting: Family Medicine

## 2019-08-07 ENCOUNTER — Ambulatory Visit: Payer: Self-pay

## 2019-08-07 VITALS — BP 134/70 | HR 73 | Ht 65.0 in | Wt 214.0 lb

## 2019-08-07 DIAGNOSIS — M1712 Unilateral primary osteoarthritis, left knee: Secondary | ICD-10-CM

## 2019-08-07 DIAGNOSIS — G8929 Other chronic pain: Secondary | ICD-10-CM

## 2019-08-07 DIAGNOSIS — M25562 Pain in left knee: Secondary | ICD-10-CM | POA: Diagnosis not present

## 2019-08-07 NOTE — Progress Notes (Signed)
[facility] [address] [place] Phone: [phone] Subjective:   I Joshua [name] am serving as a Education administrator for Dr. Hulan Saas.  This visit occurred during the SARS-CoV-2 public health emergency.  Safety protocols were in place, including screening questions prior to the visit, additional usage of staff PPE, and extensive cleaning of exam room while observing appropriate contact time as indicated for disinfecting solutions.   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  Has had 2 in  6 months. Discussed with patient about icing regimen, home exercises, which activities to do which wants to avoid.  Patient is to increase activity slowly.  Patient will have a repeat epidural again.  Patient wants to avoid any type of surgical intervention.  Patient also wants to avoid any type of medications at this time.  Agree with the plan and will follow up then again in 3 weeks after another epidural  Update 08/07/2019 Joshua [name] is a 79 y.o. male coming in with complaint of left knee pain. States he was exercising when he felt the pain. Friday he was standing at a funeral an notes the medial side of his knee started hurting. 2 knee operations on the right knee. States he feels a "bubble" on the lateral knee. Knee is unstable. Pain bilaterally in the legs.   Onset- chronic  Location - medial and lateral  Duration-  Character- sharp Aggravating factors- walking, standing  Reliving factors-  Therapies tried- brace, ice Severity-7 out of 10     Past Medical History:  Diagnosis Date  . Acute medial meniscal tear   . Anemia   . Arthritis    "middle finger right hand; right knee; neck" (02/06/2014)  . Asthma   . Bladder cancer ([place])  04/2010   "cauterized during prostate OR"  . CAD (coronary artery disease)    CABG 2001  . Depression   . Diverticulosis   . DJD (degenerative joint disease)    BACK  . GERD (gastroesophageal reflux disease)   . H/O inguinal hernia repair 12/2018  . History of gout   . Hyperlipidemia   . Hypertension   . IBS (irritable bowel syndrome)   . Obesity   . OSA (obstructive sleep apnea)    USES CPAP   . Pancreatitis ~ 1980  . Prostate cancer ([facility]) 04/2010   Past Surgical History:  Procedure Laterality Date  . ANTERIOR CERVICAL DECOMP/DISCECTOMY FUSION  05/26/2006   PLATE PLACED  . CARDIAC CATHETERIZATION  11/20/1999  . CORONARY ARTERY BYPASS GRAFT  11/20/1999   SVG-RI1-RI2, SVG-OM, SVG-dRCA  . CORONARY STENT INTERVENTION N/A 04/13/2019   Procedure: CORONARY STENT INTERVENTION;  Surgeon: Burnell Blanks, MD;  Location: [facility];  Service: Cardiovascular;  Laterality: N/A;  . INGUINAL HERNIA REPAIR Left 01/02/2019   Procedure: OPEN LEFT INGUINAL HERNIA REPAIR WITH MESH;  Surgeon: Johnathan Hausen, MD;  Location: [facility];  Service: General;  Laterality: Left;  . KNEE ARTHROSCOPY Right 1992; 04/2008  . LAPAROSCOPIC CHOLECYSTECTOMY  2007  . LEFT HEART CATH AND CORS/GRAFTS ANGIOGRAPHY N/A 04/13/2019   Procedure: LEFT HEART CATH AND CORS/GRAFTS ANGIOGRAPHY;  Surgeon: Burnell Blanks, MD;  Location: [facility];  Service: Cardiovascular;  Laterality: N/A;  . NASAL SEPTUM SURGERY  Right 2007  . ROBOT ASSISTED LAPAROSCOPIC RADICAL PROSTATECTOMY  04/2010  . THYROIDECTOMY, PARTIAL  1980   Social History   Socioeconomic History  . Marital status: Married    Spouse name: Not on file  . Number of children: Not on file  . Years of education: Not on file  . Highest education level: Not on file  Occupational History  . Occupation: retired    Fish farm manager: RETIRED  Tobacco Use  . Smoking status: Former Smoker    Packs/day: 1.00    Years: 35.00    Pack years: 35.00     Types: Cigarettes    Quit date: 06/16/1992    Years since quitting: 27.1  . Smokeless tobacco: Never Used  Substance and Sexual Activity  . Alcohol use: Yes    Alcohol/week: 7.0 standard drinks    Types: 7 Shots of liquor per week    Comment: mixed drink Q DAY  . Drug use: No  . Sexual activity: Not Currently    Partners: Female  Other Topics Concern  . Not on file  Social History Narrative   Married 42 years in 2015. No kids (mumps at age 59)      Retired from [employer] in Engineer, technical sales   Volunteers at [place]: poker, movies and tv, staying active      Right handed   2 level home with steps he uses   Social Determinants of Radio broadcast assistant Strain: [place]   . Difficulty of Paying Living Expenses: Not hard at all  Food Insecurity:   . Worried About Charity fundraiser in the Last Year:   . Arboriculturist in the Last Year:   Transportation Needs: No Transportation Needs  . Lack of Transportation (Medical): No  . Lack of Transportation (Non-Medical): No  Physical Activity: Unknown  . Days of Exercise per Week: 5 days  . Minutes of Exercise per Session: Not on file  Stress:   . Feeling of Stress :   Social Connections:   . Frequency of Communication with Friends and Family:   . Frequency of Social Gatherings with Friends and Family:   . Attends Religious Services:   . Active Member of Clubs or Organizations:   . Attends Archivist Meetings:   [name] Marital Status:    Allergies  Allergen Reactions  . 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 Bromfed Nausea And Vomiting  . Clarithromycin Other (See Comments)     gastritis  . Codeine Other (See Comments)    Trouble breathing  . Doxycycline Hyclate Nausea And Vomiting  . Modafinil Other (See Comments)     anxiety-nervousness  . Oxycodone-Acetaminophen Itching  . Promethazine Hcl Other (See Comments)     fainting  . Quinolones Nausea  Only    Cipro "felt real bad"  . Telithromycin Nausea And Vomiting  . Hydrocodone-Acetaminophen Rash  . Statins Other (See Comments)    Leg cramps and makes patient feel bad. Per pt tried multiple in years past  . Sulfonamide Derivatives Rash   Family History  Problem Relation Age of Onset  . Heart disease Mother   . Hyperlipidemia Mother   . Heart disease Father   . Hyperlipidemia Father   . Diabetes Brother      Current Outpatient Medications (Cardiovascular):  .  amLODipine (NORVASC) 5 MG tablet, TAKE 1 TABLET BY MOUTH EVERY  DAY .  EPINEPHrine 0.3 mg/0.3 mL IJ SOAJ injection, See admin instructions. for allergic reaction .  Evolocumab (REPATHA SURECLICK) [id number] MG/ML SOAJ, Inject 140 mg into the skin every 14 (fourteen) days. .  hydrochlorothiazide (HYDRODIURIL) 25 MG tablet, TAKE 1 TABLET BY MOUTH EVERY DAY .  losartan (COZAAR) 100 MG tablet, TAKE 1 TABLET BY MOUTH EVERY DAY (Patient taking differently: Take 100 mg by mouth at bedtime. )  Current Outpatient Medications (Respiratory):  .  albuterol (PROAIR HFA) 108 (90 BASE) MCG/ACT inhaler, Inhale 1-2 puffs into the lungs every 6 (six) hours as needed for wheezing or shortness of breath. (Patient taking differently: Inhale 1-2 puffs into the lungs as needed for wheezing or shortness of breath. ) .  FLOVENT HFA 110 MCG/ACT inhaler, Inhale 1 puff into the lungs 2 (two) times daily.  .  fluticasone (FLONASE) 50 MCG/ACT nasal spray, Place 1 spray into both nostrils 2 (two) times daily.  [name]  loratadine (CLARITIN) 10 MG tablet, Take 10 mg by mouth at bedtime.   Current Outpatient Medications (Analgesics):  .  acetaminophen (TYLENOL) 500 MG tablet, Take 1,000 mg by mouth as needed for moderate pain or headache.  [name]  aspirin EC 81 MG tablet, Take 81 mg by mouth at bedtime.  .  traMADol (ULTRAM) 50 MG tablet, Take 1 tablet (50 mg total) by mouth every 6 (six) hours as needed.  Current Outpatient Medications (Hematological):  .   clopidogrel (PLAVIX) 75 MG tablet, Take 1 tablet (75 mg total) by mouth daily with breakfast. .  folic acid (FOLVITE) Q000111Q MCG tablet, Take 800 mcg by mouth daily.  .  vitamin B-12 (CYANOCOBALAMIN) 1000 MCG tablet, Take 1,000 mcg by mouth daily.  Current Outpatient Medications (Other):  [name]  Cholecalciferol (VITAMIN D3) 50 MCG (2000 UT) TABS, Take 2,000 Units by mouth daily. .  diazepam (VALIUM) 5 MG tablet, Take 1 tablet (5 mg total) by mouth at bedtime as needed. (Patient taking differently: Take 5 mg by mouth at bedtime as needed (sleep). ) .  gabapentin (NEURONTIN) 300 MG capsule, Take 1 cap in AM, 1 cap at noon, 2 caps at bedtime (Patient taking differently: Take 300-600 mg by mouth See admin instructions. Take 1 capsule (300 mg) by mouth in the morning, take 1 capsule (300 mg) by mouth with lunch, & take 2 capsules (600 mg) by mouth at bedtime.) .  Glucosamine-Chondroitin (MOVE FREE PO), Take 1 tablet by mouth daily. .  hydrocortisone 2.5 % ointment,  .  ketoconazole (NIZORAL) 2 % cream, Apply 1 application topically 2 (two) times daily as needed for irritation.  .  meclizine (ANTIVERT) 25 MG tablet, Take 1 tablet (25 mg total) by mouth 3 (three) times daily as needed for dizziness. .  Miconazole Nitrate (LOTRIMIN AF DEODORANT POWDER) 2 % AERP, Apply 1 spray topically 2 (two) times daily. .  Misc Natural Products (TART CHERRY ADVANCED PO), Take 1 tablet by mouth daily. .  ondansetron (ZOFRAN ODT) 8 MG disintegrating tablet, Take 1 tablet (8 mg total) by mouth every 8 (eight) hours as needed for nausea or vomiting. .  pantoprazole (PROTONIX) 40 MG tablet, TAKE 1 TABLET BY MOUTH EVERY DAY .  polyethylene glycol powder (GLYCOLAX/MIRALAX) powder, Take 17 g by mouth daily. (Patient taking differently: Take 17 g by mouth 2 (two) times daily. ) .  Probiotic Product (ALIGN PO), Take 7.5 g by mouth daily.   Reviewed prior external information including notes and imaging from  primary care provider As  well as notes that were available from care everywhere and other healthcare systems.  Past medical history, social, surgical and family history all reviewed in electronic medical record.  No pertanent information unless stated regarding to the chief complaint.   Review of Systems:  No headache, visual changes, nausea, vomiting, diarrhea, constipation, dizziness, abdominal pain, skin rash, fevers, chills, night sweats, weight loss, swollen lymph nodes, body aches chest pain, shortness of breath, mood changes. POSITIVE muscle aches, joint swelling  Objective  Blood pressure 134/70, pulse 73, height 5\' 5"  (1.651 m), weight 214 lb (97.1 kg), SpO2 98 %.   General: No apparent distress alert and oriented x3 mood and affect normal, dressed appropriately.  HEENT: Pupils equal, extraocular movements intact  Respiratory: Patient's speak in full sentences and does not appear short of breath  Cardiovascular: No lower extremity edema, non tender, no erythema  Neuro: Cranial nerves II through XII are intact, neurovascularly intact in all extremities with 2+ DTRs and 2+ pulses.  Gait mild antalgic Knee: Left valgus deformity noted.  Abnormal thigh to calf ratio.  Tender to palpation over medial and PF joint line.  ROM full in flexion and extension and lower leg rotation. instability with valgus force.  painful patellar compression. Patellar glide with moderate crepitus. Patellar and quadriceps tendons unremarkable. Hamstring and quadriceps strength is normal. Contralateral knee shows mild arthritic changes comparatively  Limited musculoskeletal ultrasound was performed and interpreted by Lyndal Pulley  Limited ultrasound of patient's left knee shows the patient have severe near bone-on-bone osteoarthritic changes of the medial compartment weightbearing acute on chronic medial meniscal tear with displacement noted.  Hypoechoic changes and increasing Doppler flow in the surrounding area makes it acute.   No Baker's cyst noted moderate narrowing of the patellofemoral joint with trace effusion noted. Severe arthritic changes of the knee   Impression and Recommendations:     This case required medical decision making of moderate complexity. The above documentation has been reviewed and is accurate and complete Lyndal Pulley, DO       Note: This dictation was prepared with Dragon dictation along with smaller phrase technology. Any transcriptional errors that result from this process are unintentional.

## 2019-08-07 NOTE — Patient Instructions (Addendum)
Try pennsaid Will check with doctor about steroid See me again in 4-5 weeks

## 2019-08-08 ENCOUNTER — Encounter: Payer: Self-pay | Admitting: Family Medicine

## 2019-08-08 DIAGNOSIS — M1712 Unilateral primary osteoarthritis, left knee: Secondary | ICD-10-CM | POA: Insufficient documentation

## 2019-08-08 NOTE — Assessment & Plan Note (Signed)
Severe with an acute meniscal tear in appearance.  Patient does have an abnormal thigh to calf ratio and instability of the knee we will get him a custom OA or medial unloader brace.  I think this will be beneficial with the use majority of patients arthritis of the medial compartment.  Discussed topical anti-inflammatories.  Discussed Tylenol scheduled.  We will try conservative therapy.  Follow-up in 4 to 6 weeks.  Worsening symptoms consider injections and possibly formal physical therapy

## 2019-08-15 ENCOUNTER — Other Ambulatory Visit: Payer: Self-pay | Admitting: Family Medicine

## 2019-08-17 DIAGNOSIS — J3089 Other allergic rhinitis: Secondary | ICD-10-CM | POA: Diagnosis not present

## 2019-08-17 DIAGNOSIS — J301 Allergic rhinitis due to pollen: Secondary | ICD-10-CM | POA: Diagnosis not present

## 2019-08-17 DIAGNOSIS — J3081 Allergic rhinitis due to animal (cat) (dog) hair and dander: Secondary | ICD-10-CM | POA: Diagnosis not present

## 2019-08-18 DIAGNOSIS — M1712 Unilateral primary osteoarthritis, left knee: Secondary | ICD-10-CM | POA: Diagnosis not present

## 2019-08-25 ENCOUNTER — Encounter: Payer: Self-pay | Admitting: Family Medicine

## 2019-08-25 ENCOUNTER — Ambulatory Visit (INDEPENDENT_AMBULATORY_CARE_PROVIDER_SITE_OTHER): Payer: PPO | Admitting: Family Medicine

## 2019-08-25 ENCOUNTER — Other Ambulatory Visit: Payer: Self-pay

## 2019-08-25 VITALS — BP 136/64 | HR 81 | Temp 97.8°F | Ht 65.0 in | Wt 210.0 lb

## 2019-08-25 DIAGNOSIS — E559 Vitamin D deficiency, unspecified: Secondary | ICD-10-CM | POA: Diagnosis not present

## 2019-08-25 DIAGNOSIS — R5383 Other fatigue: Secondary | ICD-10-CM

## 2019-08-25 DIAGNOSIS — E538 Deficiency of other specified B group vitamins: Secondary | ICD-10-CM

## 2019-08-25 DIAGNOSIS — R52 Pain, unspecified: Secondary | ICD-10-CM | POA: Diagnosis not present

## 2019-08-25 DIAGNOSIS — G4733 Obstructive sleep apnea (adult) (pediatric): Secondary | ICD-10-CM | POA: Diagnosis not present

## 2019-08-25 LAB — CBC WITH DIFFERENTIAL/PLATELET
Basophils Absolute: 0.1 10*3/uL (ref 0.0–0.1)
Basophils Relative: 0.9 % (ref 0.0–3.0)
Eosinophils Absolute: 0.3 10*3/uL (ref 0.0–0.7)
Eosinophils Relative: 4.8 % (ref 0.0–5.0)
HCT: 39.3 % (ref 39.0–52.0)
Hemoglobin: 13.8 g/dL (ref 13.0–17.0)
Lymphocytes Relative: 16.1 % (ref 12.0–46.0)
Lymphs Abs: 1.1 10*3/uL (ref 0.7–4.0)
MCHC: 35.2 g/dL (ref 30.0–36.0)
MCV: 89 fl (ref 78.0–100.0)
Monocytes Absolute: 0.6 10*3/uL (ref 0.1–1.0)
Monocytes Relative: 8.5 % (ref 3.0–12.0)
Neutro Abs: 4.7 10*3/uL (ref 1.4–7.7)
Neutrophils Relative %: 69.7 % (ref 43.0–77.0)
Platelets: 234 10*3/uL (ref 150.0–400.0)
RBC: 4.42 Mil/uL (ref 4.22–5.81)
RDW: 12.6 % (ref 11.5–15.5)
WBC: 6.7 10*3/uL (ref 4.0–10.5)

## 2019-08-25 LAB — COMPREHENSIVE METABOLIC PANEL
ALT: 17 U/L (ref 0–53)
AST: 15 U/L (ref 0–37)
Albumin: 4.1 g/dL (ref 3.5–5.2)
Alkaline Phosphatase: 63 U/L (ref 39–117)
BUN: 18 mg/dL (ref 6–23)
CO2: 30 mEq/L (ref 19–32)
Calcium: 9.7 mg/dL (ref 8.4–10.5)
Chloride: 98 mEq/L (ref 96–112)
Creatinine, Ser: 1.22 mg/dL (ref 0.40–1.50)
GFR: 57.67 mL/min — ABNORMAL LOW (ref >60.00)
Glucose, Bld: 120 mg/dL — ABNORMAL HIGH (ref 70–99)
Potassium: 3.9 mEq/L (ref 3.5–5.1)
Sodium: 134 mEq/L — ABNORMAL LOW (ref 135–145)
Total Bilirubin: 0.6 mg/dL (ref 0.2–1.2)
Total Protein: 6.3 g/dL (ref 6.0–8.3)

## 2019-08-25 LAB — TSH: TSH: 0.99 u[IU]/mL (ref 0.35–4.50)

## 2019-08-25 LAB — CK: Total CK: 105 U/L (ref 7–232)

## 2019-08-25 LAB — VITAMIN D 25 HYDROXY (VIT D DEFICIENCY, FRACTURES): VITD: 48.85 ng/mL (ref 30.00–100.00)

## 2019-08-25 LAB — VITAMIN B12: Vitamin B-12: >1526 pg/mL — ABNORMAL HIGH (ref 211–911)

## 2019-08-25 NOTE — Progress Notes (Signed)
Phone [phone] In person visit   Subjective:   Joshua [name] is a 79 y.o. year old very pleasant male patient who presents for/with See problem oriented charting Chief Complaint  Patient presents with  . Fatigue   This visit occurred during the SARS-CoV-2 public health emergency.  Safety protocols were in place, including screening questions prior to the visit, additional usage of staff PPE, and extensive cleaning of exam room while observing appropriate contact time as indicated for disinfecting solutions.   Past Medical History-  Patient Active Problem List   Diagnosis Date Noted  . Coronary atherosclerosis 10/12/2006    Priority: High  . Irritable bowel syndrome 10/12/2006    Priority: High  . Hyperglycemia 10/07/2017    Priority: Medium  . Gout 11/20/2016    Priority: Medium  . CKD (chronic kidney disease), stage III ([place]) 10/23/2013    Priority: Medium  . Neuropathy ([place]) 08/21/2013    Priority: Medium  . History of bladder cancer 04/01/2010    Priority: Medium  . Essential hypertension 02/21/2007    Priority: Medium  . Hyperlipidemia 10/12/2006    Priority: Medium  . Depression, major, single episode, in partial remission ([place]) 10/12/2006    Priority: Medium  . Asthma 10/12/2006    Priority: Medium  . Aortic atherosclerosis ([place]) 05/05/2018    Priority: Low  . Senile purpura ([place]) 10/07/2017    Priority: Low  . DJD (degenerative joint disease)     Priority: Low  . Arthritis     Priority: Low  . Neck pain 02/08/2017    Priority: Low  . Venous insufficiency 12/29/2016    Priority: Low  . Ganglion cyst of tendon sheath of right hand 12/29/2016    Priority: Low  . Avulsion fracture of metatarsal bone of right foot 11/25/2016    Priority: Low  . Obesity, Class I, BMI 30-34.9 10/03/2015    Priority: Low  . Allergic rhinitis 05/31/2014    Priority: Low  . Fatigue 11/09/2013    Priority: Low  . Dizziness 08/23/2013    Priority: Low  . RLS (restless  legs syndrome) 01/03/2013    Priority: Low  . Chronic bilateral low back pain with left-sided sciatica 10/09/2011    Priority: Low  . OSTEOARTHRITIS, LOWER LEG, LEFT 10/04/2009    Priority: Low  . TOBACCO ABUSE, HX OF 12/26/2008    Priority: Low  . ANEMIA, IRON DEFICIENCY 11/06/2008    Priority: Low  . 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 OSA (obstructive sleep apnea) 11/02/2006    Priority: Low  . GERD 10/12/2006    Priority: Low  . Degenerative arthritis of left knee 08/08/2019  . Educated about COVID-19 virus infection 04/20/2019  . Unstable angina ([place])   . Greater trochanteric bursitis of left hip 10/27/2018  . Degenerative disc disease, lumbar 10/27/2018  . Abdominal muscle strain, initial encounter 06/16/2018  . Exertional angina ([place]) 04/18/2018  . Weakness of left leg 06/21/2017  . Hereditary and idiopathic peripheral neuropathy 09/25/2014    Medications- reviewed and updated Current Outpatient Medications  Medication Sig Dispense Refill  . acetaminophen (TYLENOL) 500 MG tablet Take 1,000 mg by mouth as needed for moderate pain or headache.     . albuterol (PROAIR HFA) 108 (90 BASE) MCG/ACT inhaler Inhale 1-2 puffs into the lungs every 6 (six) hours as needed for wheezing or shortness of breath. (Patient taking differently: Inhale 1-2 puffs into the lungs as needed for wheezing or shortness of breath. )  18 g 3  . amLODipine (NORVASC) 5 MG tablet TAKE 1 TABLET BY MOUTH EVERY DAY 90 tablet 3  . aspirin EC 81 MG tablet Take 81 mg by mouth at bedtime.     . Cholecalciferol (VITAMIN D3) 50 MCG (2000 UT) TABS Take 2,000 Units by mouth daily.    . clopidogrel (PLAVIX) 75 MG tablet Take 1 tablet (75 mg total) by mouth daily with breakfast. 90 tablet 3  . diazepam (VALIUM) 5 MG tablet TAKE 1 TABLET (5 MG TOTAL) BY MOUTH AT BEDTIME AS NEEDED. 30 tablet 1  . EPINEPHrine 0.3 mg/0.3 mL IJ SOAJ injection See admin instructions. for allergic reaction    .  Evolocumab (REPATHA SURECLICK) 700 MG/ML SOAJ Inject 140 mg into the skin every 14 (fourteen) days. 6 pen 3  . FLOVENT HFA 110 MCG/ACT inhaler Inhale 1 puff into the lungs 2 (two) times daily.   5  . fluticasone (FLONASE) 50 MCG/ACT nasal spray Place 1 spray into both nostrils 2 (two) times daily.     . folic acid (FOLVITE) 174 MCG tablet Take 800 mcg by mouth daily.     [name] gabapentin (NEURONTIN) 300 MG capsule Take 1 cap in AM, 1 cap at noon, 2 caps at bedtime (Patient taking differently: Take 300-600 mg by mouth See admin instructions. Take 1 capsule (300 mg) by mouth in the morning, take 1 capsule (300 mg) by mouth with lunch, & take 2 capsules (600 mg) by mouth at bedtime.) 360 capsule 3  . Glucosamine-Chondroitin (MOVE FREE PO) Take 1 tablet by mouth daily.    . hydrochlorothiazide (HYDRODIURIL) 25 MG tablet TAKE 1 TABLET BY MOUTH EVERY DAY 90 tablet 2  . ketoconazole (NIZORAL) 2 % cream Apply 1 application topically 2 (two) times daily as needed for irritation.     [name] loratadine (CLARITIN) 10 MG tablet Take 10 mg by mouth at bedtime.     [name] losartan (COZAAR) 100 MG tablet TAKE 1 TABLET BY MOUTH EVERY DAY (Patient taking differently: Take 100 mg by mouth at bedtime. ) 90 tablet 3  . Miconazole Nitrate (LOTRIMIN AF DEODORANT POWDER) 2 % AERP Apply 1 spray topically 2 (two) times daily.    . Misc Natural Products (TART CHERRY ADVANCED PO) Take 1 tablet by mouth daily.    . ondansetron (ZOFRAN ODT) 8 MG disintegrating tablet Take 1 tablet (8 mg total) by mouth every 8 (eight) hours as needed for nausea or vomiting. 20 tablet 0  . pantoprazole (PROTONIX) 40 MG tablet TAKE 1 TABLET BY MOUTH EVERY DAY 90 tablet 1  . polyethylene glycol powder (GLYCOLAX/MIRALAX) powder Take 17 g by mouth daily. (Patient taking differently: Take 17 g by mouth 2 (two) times daily. ) 255 g 0  . Probiotic Product (ALIGN PO) Take 7.5 g by mouth daily.    . traMADol (ULTRAM) 50 MG tablet Take 1 tablet (50 mg total) by mouth  every 6 (six) hours as needed. 20 tablet 1  . vitamin B-12 (CYANOCOBALAMIN) 1000 MCG tablet Take 1,000 mcg by mouth daily.    . meclizine (ANTIVERT) 25 MG tablet Take 1 tablet (25 mg total) by mouth 3 (three) times daily as needed for dizziness. (Patient not taking: Reported on 08/25/2019) 30 tablet 0   No current facility-administered medications for this visit.     Objective:  BP 136/64   Pulse 81   Temp 97.8 F (36.6 C) (Temporal)   Ht 5\' 5"  (1.651 m)   Wt 210 lb (95.3 kg)  SpO2 97%   BMI 34.95 kg/m  Gen: NAD, resting comfortably CV: RRR no murmurs rubs or gallops Lungs: CTAB no crackles, wheeze, rhonchi Abdomen: soft/nontender/nondistended/normal bowel sounds. No rebound or guarding.  Ext: no edema Skin: warm, dry MSK: Tender musculature over thighs and lower legs    Assessment and Plan   # Fatigue  S: Patient states that he has been very tired for the last few weeks. He states he is sleeping 9 to 11 hours a night and still is exhausted. He has had chills daily- more of a feeling of cold than chills he says and flushes at night- no fevers with this. He has had increased body aches. He has been taking frequent naps. He does have a c pap that he uses every night. Last sleep study was over 6 years ago but he has had follow up in the last few months. Didn't enjoy going to [place] due to the fatigue last weekend when hed normally love this.   Body aches/myalgias noted-primarily thighs, lower legs, back and hips but denies morning stiffness  Stent placed 04/13/19 and he has felt well since DES placed at that time- had chest pain prior to this but no fatigue. Had similar fatigue when he had cardiac issues many years ago before CABG. Currently,  No chest pain or shortness of breath. Wants to work out but knee and fatigue bothering him. If he does work out- fatigue does not worsen.   Possible medication changes/relations: 1. He stopped omeprazole a week ago and transitioned to  pantoprazole but that transition was after he was already tired. Some stomach irritation with the change.  2. Has been on repatha for 3 years.  I did share I have some patients with issues with fatigue and body aches on Repatha 3. Taking b12 regularly with history of deficiency  Feels like fatigue is causing depressed mood because he cant do as much as he would like. There is some stress with house being worked on and that has been stressful as well. Wife is stressed  Intentionally  Down 12-13 lbs on home scales. Down 8 lbs on our scales- exercising and eating better. Exercise does not worsen fatigue. Some weepiness of eyes from muscle aches.   ROS- No fever, chills, cough, congestion, runny nose, shortness of breath, sore throat, headache, nausea, vomiting, diarrhea, or new loss of taste or smell. No known contacts with covid 19 or someone being tested for covid 19. Does have fatigue and body aches.  A/P: 79 year old male presenting with fatigue -Update blood work as below to look for any obvious causes  -Compliant with B12 but will check B12 levels  -Last vitamin D was low-he is on medication but we will check this to make sure not low  -Check TSH, CBC, CMP -Trial off Repatha x1 dose-we will skip his next Tuesday dose and then resume 2 weeks after that -He had an already scheduled visit in 10 days and we will use an opportunity to check in -If no improvement in symptoms or no obvious cause on labs consider cardiology follow-up -Does have some depressed mood and PHQ 9 up to 10 but he feels like this is primarily related to his fatigue which is producing some down mood and not the opposite-we will monitor.  He has not tolerated SSRIs in the past anyway so not the best time to start/trial this -For body aches doubt polymyalgia rheumatica with no morning stiffness and achiness primarily being lower body -Try to  add CK level but not sure added in time  Recommended follow up:  10-day  follow-up Future Appointments  Date Time Provider [place]  09/04/2019  9:40 AM [name], MD [facility]  09/05/2019  8:30 AM Lyndal Pulley, DO [facility] None  10/03/2019  8:30 AM Cameron Sprang, MD [facility] None  02/23/2020  8:20 AM [name], MD [facility]    Lab/Order associations:   ICD-10-CM   1. Fatigue, unspecified type  R53.83 TSH    CBC with Differential/Platelet    Comprehensive metabolic panel  2. B12 deficiency  E53.8 Vitamin B12  3. Vitamin D deficiency  E55.9 VITAMIN D 25 Hydroxy (Vit-D Deficiency, Fractures)  4. Body aches  R52 CK (Creatine Kinase)    Time Spent: 31 minutes of total time (1:21 PM- 1:52 PM) was spent on the date of the encounter performing the following actions: chart review prior to seeing the patient, obtaining history, performing a medically necessary exam, counseling on the treatment plan, placing orders, and documenting in our EHR.   Return precautions advised.  [name], MD

## 2019-08-25 NOTE — Patient Instructions (Addendum)
Lets skip one dose of repatha (dose next Tuesday)  If any chest pain or shortness of breath seek care- we may consider cardiology follow up if workup unrevealing given your history though recent catheterization is reassuring  Please stop by lab before you go If you have mychart- we will send your results within 3 business days of [place] receiving them.  If you do not have mychart- we will call you about results within 5 business days of [place] receiving them.   Recommended follow up: 10 day follow up already scheduled- lets use that to check back in.

## 2019-08-30 ENCOUNTER — Telehealth: Payer: Self-pay | Admitting: Family Medicine

## 2019-08-30 NOTE — Telephone Encounter (Signed)
Patient was given a brace from [facility] but he does not like the way that it fits. He said that he has called [facility] twice and left messages with no response. He has also contacted the main office with no help.  Can you help with this?

## 2019-08-30 NOTE — Telephone Encounter (Signed)
Sent [employer] rep an email to contact patient.

## 2019-08-31 DIAGNOSIS — J3081 Allergic rhinitis due to animal (cat) (dog) hair and dander: Secondary | ICD-10-CM | POA: Diagnosis not present

## 2019-08-31 DIAGNOSIS — J3089 Other allergic rhinitis: Secondary | ICD-10-CM | POA: Diagnosis not present

## 2019-08-31 DIAGNOSIS — J301 Allergic rhinitis due to pollen: Secondary | ICD-10-CM | POA: Diagnosis not present

## 2019-08-31 NOTE — Telephone Encounter (Signed)
Rep contacted patient and set him up for fitting.

## 2019-09-04 ENCOUNTER — Other Ambulatory Visit: Payer: Self-pay

## 2019-09-04 ENCOUNTER — Encounter: Payer: Self-pay | Admitting: Family Medicine

## 2019-09-04 ENCOUNTER — Ambulatory Visit (INDEPENDENT_AMBULATORY_CARE_PROVIDER_SITE_OTHER): Payer: PPO | Admitting: Family Medicine

## 2019-09-04 VITALS — BP 110/62 | HR 86 | Temp 97.8°F | Ht 65.0 in | Wt 209.0 lb

## 2019-09-04 DIAGNOSIS — Z87891 Personal history of nicotine dependence: Secondary | ICD-10-CM | POA: Diagnosis not present

## 2019-09-04 DIAGNOSIS — F325 Major depressive disorder, single episode, in full remission: Secondary | ICD-10-CM | POA: Diagnosis not present

## 2019-09-04 DIAGNOSIS — I10 Essential (primary) hypertension: Secondary | ICD-10-CM

## 2019-09-04 DIAGNOSIS — R5383 Other fatigue: Secondary | ICD-10-CM | POA: Diagnosis not present

## 2019-09-04 DIAGNOSIS — E785 Hyperlipidemia, unspecified: Secondary | ICD-10-CM | POA: Diagnosis not present

## 2019-09-04 NOTE — Patient Instructions (Addendum)
Health Maintenance Due  Topic Date Due  . Hepatitis C Screening Has given blood in past  Never done   Hold Repatha for one more week than restart to see if any improvement in fatigue and muscle aches.   Call Dr. Tamala Julian and see if you need to keep your appointment tomorrow. We have also sent message to let them know you will be calling.   Fatigue:  All your labs did not show reason for your fatigue.  Make sure you are using your c-pap as much as you can.   We have placed order for Chest x ray. We do not have x ray at our office now your will need to go to  [facility] Below is the informations on where their office is located. If that is normal we want you to call Cardiology for follow up.   I have ordered xrays for you. At this time we do not have xrays in our clinic. You will have to go to our [facility]. The address is [address]  xray is located in the basement.  Hours of operation are M-F 8:30am to 5:00pm.  Closed for lunch between 12:30 and 1:00pm.   Please call and let us know how you are doing in about a month.   We have a Follow up in December. If you need [place] sooner let our office know.

## 2019-09-04 NOTE — Progress Notes (Signed)
Phone [phone] In person visit   Subjective:   Joshua [name] is a 79 y.o. year old very pleasant male patient who presents for/with See problem oriented charting Chief Complaint  Patient presents with  . Follow-up   This visit occurred during the SARS-CoV-2 public health emergency.  Safety protocols were in place, including screening questions prior to the visit, additional usage of staff PPE, and extensive cleaning of exam room while observing appropriate contact time as indicated for disinfecting solutions.   Past Medical History-  Patient Active Problem List   Diagnosis Date Noted  . Coronary atherosclerosis 10/12/2006    Priority: High  . Irritable bowel syndrome 10/12/2006    Priority: High  . Hyperglycemia 10/07/2017    Priority: Medium  . Gout 11/20/2016    Priority: Medium  . CKD (chronic kidney disease), stage III ([place]) 10/23/2013    Priority: Medium  . Neuropathy ([place]) 08/21/2013    Priority: Medium  . History of bladder cancer 04/01/2010    Priority: Medium  . Essential hypertension 02/21/2007    Priority: Medium  . Hyperlipidemia 10/12/2006    Priority: Medium  . Depression, major, single episode, in partial remission ([place]) 10/12/2006    Priority: Medium  . Asthma 10/12/2006    Priority: Medium  . Aortic atherosclerosis ([place]) 05/05/2018    Priority: Low  . DJD (degenerative joint disease)     Priority: Low  . Arthritis     Priority: Low  . Neck pain 02/08/2017    Priority: Low  . Venous insufficiency 12/29/2016    Priority: Low  . Ganglion cyst of tendon sheath of right hand 12/29/2016    Priority: Low  . Avulsion fracture of metatarsal bone of right foot 11/25/2016    Priority: Low  . Obesity, Class I, BMI 30-34.9 10/03/2015    Priority: Low  . Allergic rhinitis 05/31/2014    Priority: Low  . Fatigue 11/09/2013    Priority: Low  . Dizziness 08/23/2013    Priority: Low  . RLS (restless legs syndrome) 01/03/2013    Priority: Low  .  Chronic bilateral low back pain with left-sided sciatica 10/09/2011    Priority: Low  . OSTEOARTHRITIS, LOWER LEG, LEFT 10/04/2009    Priority: Low  . ANEMIA, IRON DEFICIENCY 11/06/2008    Priority: Low  . 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 OSA (obstructive sleep apnea) 11/02/2006    Priority: Low  . GERD 10/12/2006    Priority: Low  . Degenerative arthritis of left knee 08/08/2019  . Educated about COVID-19 virus infection 04/20/2019  . Unstable angina ([place])   . Greater trochanteric bursitis of left hip 10/27/2018  . Degenerative disc disease, lumbar 10/27/2018  . Abdominal muscle strain, initial encounter 06/16/2018  . Weakness of left leg 06/21/2017  . Hereditary and idiopathic peripheral neuropathy 09/25/2014    Medications- reviewed and updated    Objective:  BP 110/62   Pulse 86   Temp 97.8 F (36.6 C) (Temporal)   Ht 5\' 5"  (1.651 m)   Wt 209 lb (94.8 kg)   SpO2 98%   BMI 34.78 kg/m  Gen: NAD, resting comfortably CV: RRR no murmurs rubs or gallops Lungs: CTAB no crackles, wheeze, rhonchi Abdomen: soft/nontender/nondistended/normal bowel sounds.  Ext: Stable edema Skin: warm, dry Neuro: grossly normal, moves all extremities    Assessment and Plan  #Fatigue/body aches S: Patient presented 10 days ago with fatigue.  Blood work reassuring.  B12 was normal-vitamin D levels  back in normal range.  Thyroid normal.  CK not elevated-in regards to muscle aches.  CBC and CMP largely normal-stable CKD stage III.  Did have some depressed mood but states the fatigue was causing the depressed mood-has not tolerated SSRIs in the past.   He does have OSA but he is on CPAP- having issues with tightness on his face and at times takes it off- Dr. Halford Chessman had sent him to sleep clnic- has tried several different masks. Nasal cpap causes pain. He is hesitant to return as not sure if anything else can be done. Getting 4 hours at a time and had been getting 8 hours.  He was fatigued even when he was on the 8 hours a night.   Patient also reported diffuse muscle aches-not really worse in the morning only thought polymyalgia rheumatica unlikely.  We opted to trial off 1 dose of Repatha to see if that would make a difference-he skipped his dose last Tuesday with plans to resume 2 weeks from last Tuesday.  Patient does have history of fatigue and presenting symptom for cardiac issues. Feels like memory is sharper since his last stent.   Today he reports fatigue is unchanged. Body aches mildly better. No cough or shortness of breath.   Has still been going to the gym and fatigue does not worsen with exercise. isnt able to push himself too much at the gym due to his knee- even with biking.   Trying to eat healthy so this is intentional weight loss. No night sweats. No lymph nodes enlarged. 02/21/2019 negative cologuard.   Napping in the morning and in the afternoon and still sleepy at night. A/P: 79 year old male with fatigue and body aches. -He has only been able to tolerate 4 hours of CPAP recently but his symptoms predated this decrease and was having issues at 8 hours so I do not think this is the cause -Gabapentin certainly could be the cause of fatigue. From a pain perspective he does not think he can tolerate not being on this medication so we opted not to make a change -Blood work reassuring as above -Taking 1 week off Repatha has slightly improved body aches but not fatigue-we are going to try 1 more week off and see if this makes a difference but I do not strongly suspect it well and he will restart if not improved by next week -Former smoker but does not qualify for lung cancer screening-we will get at least a chest x-ray -If chest x-ray normal he may follow-up with cardiology to be on the safe side-has presented with cardiac symptoms with fatigue in the past. He declined EKG today -No anemia on exam the Plavix and aspirin increases risk -He feels like  fatigue is main issue and is causing depressed mood-has not tolerated SSRIs anyway as above. Can consider counseling if needed in the future but not sure that would change to fatigue/sleepiness  #CAD/hyperlipidemia-history of CABG x5- follows with Dr. Percival Spanish S: Compliant with Repatha for lipids.  He is compliant with Plavix and aspirin as well.   -Stent drug eluting placed 04/13/19 Dr. Angelena Form Lab Results  Component Value Date   CHOL 96 11/03/2018   HDL 46.20 11/03/2018   LDLCALC 18 11/03/2018   LDLDIRECT 115.0 06/04/2016   TRIG 158.0 (H) 11/03/2018   CHOLHDL 2 11/03/2018  A/P: Seems to be doing well. Once again has presented with fatigue in the past for cardiac symptoms though most recently presented with chest pain when had  an updated stent. Encouraged follow-up with cardiology if her work-up is negative   #Hypertension/CKD stage III S: Compliant with amlodipine 5 mg, hydrochlorothiazide 25 mg and losartan 100 mg. Does not check readings at home.  -GFR has been stable.  On ARB in case proteinuric element BP Readings from Last 3 Encounters:  09/04/19 110/62  08/25/19 136/64  08/07/19 134/70  A/P: CKD stage III was stable. Hypertension well-controlled-he was fatigued when blood pressure was at 136 as well as 110-I do not think his symptoms are blood pressure related  % Neuropathy-follows with [name] S: Compliant with gabapentin.  Burning/needle pain in the feet  A/P: Once again he does not think he can be off of gabapentin due to pain so we will continue current medication although it could be causing his fatigue  Recommended follow up: Asked him to update me in 1 month with how he is doing Future Appointments  Date Time Provider [place]  09/05/2019  8:30 AM Lyndal Pulley, DO [facility] None  10/03/2019  8:30 AM Cameron Sprang, MD [facility] None  02/23/2020  8:20 AM [name], MD [facility]    Lab/Order associations:   ICD-10-CM   1. Essential hypertension   I10   2. Major depressive disorder with single episode, in full remission ([place])  F32.5   3. Hyperlipidemia, unspecified hyperlipidemia type  E78.5   4. Fatigue, unspecified type  R53.83   5. Former smoker  Z87.891 DG Chest 2 View  6. Other fatigue  R53.83 DG Chest 2 View   Time Spent: 34 minutes of total time (9:36 AM- 10:10AM) was spent on the date of the encounter performing the following actions: chart review prior to seeing the patient, obtaining history, performing a medically necessary exam, counseling on the treatment plan, placing orders, and documenting in our EHR.   Return precautions advised.  [name], MD

## 2019-09-05 ENCOUNTER — Ambulatory Visit (INDEPENDENT_AMBULATORY_CARE_PROVIDER_SITE_OTHER): Payer: PPO | Admitting: Family Medicine

## 2019-09-05 ENCOUNTER — Ambulatory Visit (INDEPENDENT_AMBULATORY_CARE_PROVIDER_SITE_OTHER)
Admission: RE | Admit: 2019-09-05 | Discharge: 2019-09-05 | Disposition: A | Discharge status: Home / Self Care | Payer: PPO | Source: Ambulatory Visit | Attending: Family Medicine | Admitting: Family Medicine

## 2019-09-05 ENCOUNTER — Encounter: Payer: Self-pay | Admitting: Family Medicine

## 2019-09-05 DIAGNOSIS — M1712 Unilateral primary osteoarthritis, left knee: Secondary | ICD-10-CM

## 2019-09-05 DIAGNOSIS — Z87891 Personal history of nicotine dependence: Secondary | ICD-10-CM | POA: Diagnosis not present

## 2019-09-05 DIAGNOSIS — I10 Essential (primary) hypertension: Secondary | ICD-10-CM | POA: Diagnosis not present

## 2019-09-05 DIAGNOSIS — M7712 Lateral epicondylitis, left elbow: Secondary | ICD-10-CM | POA: Diagnosis not present

## 2019-09-05 DIAGNOSIS — R5383 Other fatigue: Secondary | ICD-10-CM | POA: Diagnosis not present

## 2019-09-05 NOTE — Assessment & Plan Note (Signed)
Viscosupplementation given today, tolerated the procedure well, discussed icing regimen and home exercise.  Discussed the importance of weight loss, patient will do bracing.  Follow-up again in 4 to 8 weeks

## 2019-09-05 NOTE — Assessment & Plan Note (Signed)
Elbow anatomy was reviewed, and tendinopathy was explained.  Pt. given a home rehab program. Start with isometrics and ROM, then a series of concentric and eccentric exercises should be done starting with no weight, work up to 1 lb, hammer, etc.  Use counterforce strap if working or using hands.  Formal PT would be beneficial. Emphasized stretching an cross-friction massage Emphasized proper palms up lifting biomechanics to unload ECRB Follow-up again in 4 to 6 weeks may need physical therapy

## 2019-09-05 NOTE — Progress Notes (Signed)
[place] Phone: [phone] Subjective:    I'm seeing thisI, [name], am serving as a scribe for Dr. Hulan Saas. This visit occurred during the SARS-CoV-2 public health emergency.  Safety protocols were in place, including screening questions prior to the visit, additional usage of staff PPE, and extensive cleaning of exam room while observing appropriate contact time as indicated for disinfecting solutions.   patient by the request  of:  [name], MD  CC: Left knee pain follow-up  [id number]   08/07/2019 Severe with an acute meniscal tear in appearance.  Patient does have an abnormal thigh to calf ratio and instability of the knee we will get him a custom OA or medial unloader brace.  I think this will be beneficial with the use majority of patients arthritis of the medial compartment.  Discussed topical anti-inflammatories.  Discussed Tylenol scheduled.  We will try conservative therapy.  Follow-up in 4 to 6 weeks.  Worsening symptoms consider injections and possibly formal physical therapy  Update 09/05/2019 Joshua [name] is a 79 y.o. male coming in with complaint of left knee pain and left elbow pain. Elbow pain began one week ago. Wears a brace at night to help with pain that is just lateral to the olecranon.        Past Medical History:  Diagnosis Date  . Acute medial meniscal tear   . Anemia   . Arthritis    "middle finger right hand; right knee; neck" (02/06/2014)  . Asthma   . Bladder cancer ([place]) 04/2010   "cauterized during prostate OR"  . CAD (coronary artery disease)    CABG 2001  . Depression   . Diverticulosis   . DJD (degenerative joint disease)    BACK  . GERD (gastroesophageal reflux disease)   . H/O inguinal hernia repair 12/2018  . History of gout   . Hyperlipidemia   . Hypertension   . IBS (irritable bowel syndrome)   . Obesity   . OSA (obstructive sleep  apnea)    USES CPAP   . Pancreatitis ~ 1980  . Prostate cancer ([place]) 04/2010   Past Surgical History:  Procedure Laterality Date  . ANTERIOR CERVICAL DECOMP/DISCECTOMY FUSION  05/26/2006   PLATE PLACED  . CARDIAC CATHETERIZATION  11/20/1999  . CORONARY ARTERY BYPASS GRAFT  11/20/1999   SVG-RI1-RI2, SVG-OM, SVG-dRCA  . CORONARY STENT INTERVENTION N/A 04/13/2019   Procedure: CORONARY STENT INTERVENTION;  Surgeon: Burnell Blanks, MD;  Location: [facility];  Service: Cardiovascular;  Laterality: N/A;  . INGUINAL HERNIA REPAIR Left 01/02/2019   Procedure: OPEN LEFT INGUINAL HERNIA REPAIR WITH MESH;  Surgeon: Johnathan Hausen, MD;  Location: [facility];  Service: General;  Laterality: Left;  . KNEE ARTHROSCOPY Right 1992; 04/2008  . LAPAROSCOPIC CHOLECYSTECTOMY  2007  . LEFT HEART CATH AND CORS/GRAFTS ANGIOGRAPHY N/A 04/13/2019   Procedure: LEFT HEART CATH AND CORS/GRAFTS ANGIOGRAPHY;  Surgeon: Burnell Blanks, MD;  Location: [facility];  Service: Cardiovascular;  Laterality: N/A;  . NASAL SEPTUM SURGERY Right 2007  . ROBOT ASSISTED LAPAROSCOPIC RADICAL PROSTATECTOMY  04/2010  . THYROIDECTOMY, PARTIAL  1980   Social History   Socioeconomic History  . Marital status: Married    Spouse name: Not on file  . Number of children: Not on file  . Years of education: Not on file  . Highest education level: Not on file  Occupational History  . Occupation:  retired    Fish farm manager: RETIRED  Tobacco Use  . Smoking status: Former Smoker    Packs/day: 1.00    Years: 35.00    Pack years: 35.00    Types: Cigarettes    Quit date: 06/16/1992    Years since quitting: 27.2  . Smokeless tobacco: Never Used  Vaping Use  . Vaping Use: Never used  Substance and Sexual Activity  . Alcohol use: Yes    Alcohol/week: 7.0 standard drinks    Types: 7 Shots of liquor per week    Comment: mixed drink Q DAY  . Drug use: No  . Sexual activity: Not Currently    Partners: Female  Other  Topics Concern  . Not on file  Social History Narrative   Married 42 years in 2015. No kids (mumps at age 62)      Retired from [employer] in Engineer, technical sales   Volunteers at [employer]: poker, movies and tv, staying active      Right handed   2 level home with steps he uses   Social Determinants of Radio broadcast assistant Strain: [place]   . Difficulty of Paying Living Expenses: Not hard at all  Food Insecurity:   . Worried About Charity fundraiser in the Last Year:   . Arboriculturist in the Last Year:   Transportation Needs: No Transportation Needs  . Lack of Transportation (Medical): No  . Lack of Transportation (Non-Medical): No  Physical Activity: Unknown  . Days of Exercise per Week: 5 days  . Minutes of Exercise per Session: Not on file  Stress:   . Feeling of Stress :   Social Connections:   . Frequency of Communication with Friends and Family:   . Frequency of Social Gatherings with Friends and Family:   . Attends Religious Services:   . Active Member of Clubs or Organizations:   . Attends Archivist Meetings:   [name] Marital Status:    Allergies  Allergen Reactions  . 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 Bromfed Nausea And Vomiting  . Clarithromycin Other (See Comments)     gastritis  . Codeine Other (See Comments)    Trouble breathing  . Doxycycline Hyclate Nausea And Vomiting  . Modafinil Other (See Comments)     anxiety-nervousness  . Oxycodone-Acetaminophen Itching  . Promethazine Hcl Other (See Comments)     fainting  . Quinolones Nausea Only    Cipro "felt real bad"  . Telithromycin Nausea And Vomiting  . Hydrocodone-Acetaminophen Rash  . Statins Other (See Comments)    Leg cramps and makes patient feel bad. Per pt tried multiple in years past  . Sulfonamide Derivatives Rash   Family History  Problem Relation Age of Onset  . Heart disease Mother   . Hyperlipidemia  Mother   . Heart disease Father   . Hyperlipidemia Father   . Diabetes Brother      Current Outpatient Medications (Cardiovascular):  .  amLODipine (NORVASC) 5 MG tablet, TAKE 1 TABLET BY MOUTH EVERY DAY .  EPINEPHrine 0.3 mg/0.3 mL IJ SOAJ injection, See admin instructions. for allergic reaction .  Evolocumab (REPATHA SURECLICK) 102 MG/ML SOAJ, Inject 140 mg into the skin every 14 (fourteen) days. .  hydrochlorothiazide (HYDRODIURIL) 25 MG tablet, TAKE 1 TABLET BY MOUTH EVERY DAY .  losartan (COZAAR) 100 MG tablet, TAKE 1 TABLET BY  MOUTH EVERY DAY (Patient taking differently: Take 100 mg by mouth at bedtime. )  Current Outpatient Medications (Respiratory):  .  albuterol (PROAIR HFA) 108 (90 BASE) MCG/ACT inhaler, Inhale 1-2 puffs into the lungs every 6 (six) hours as needed for wheezing or shortness of breath. (Patient taking differently: Inhale 1-2 puffs into the lungs as needed for wheezing or shortness of breath. ) .  FLOVENT HFA 110 MCG/ACT inhaler, Inhale 1 puff into the lungs 2 (two) times daily.  .  fluticasone (FLONASE) 50 MCG/ACT nasal spray, Place 1 spray into both nostrils 2 (two) times daily.  [name]  loratadine (CLARITIN) 10 MG tablet, Take 10 mg by mouth at bedtime.   Current Outpatient Medications (Analgesics):  .  acetaminophen (TYLENOL) 500 MG tablet, Take 1,000 mg by mouth as needed for moderate pain or headache.  [name]  aspirin EC 81 MG tablet, Take 81 mg by mouth at bedtime.  .  traMADol (ULTRAM) 50 MG tablet, Take 1 tablet (50 mg total) by mouth every 6 (six) hours as needed.  Current Outpatient Medications (Hematological):  .  clopidogrel (PLAVIX) 75 MG tablet, Take 1 tablet (75 mg total) by mouth daily with breakfast. .  folic acid (FOLVITE) 151 MCG tablet, Take 800 mcg by mouth daily.  .  vitamin B-12 (CYANOCOBALAMIN) 1000 MCG tablet, Take 1,000 mcg by mouth daily.  Current Outpatient Medications (Other):  [name]  Cholecalciferol (VITAMIN D3) 50 MCG (2000 UT) TABS, Take  2,000 Units by mouth daily. .  diazepam (VALIUM) 5 MG tablet, TAKE 1 TABLET (5 MG TOTAL) BY MOUTH AT BEDTIME AS NEEDED. [name]  gabapentin (NEURONTIN) 300 MG capsule, Take 1 cap in AM, 1 cap at noon, 2 caps at bedtime (Patient taking differently: Take 300-600 mg by mouth See admin instructions. Take 1 capsule (300 mg) by mouth in the morning, take 1 capsule (300 mg) by mouth with lunch, & take 2 capsules (600 mg) by mouth at bedtime.) .  Glucosamine-Chondroitin (MOVE FREE PO), Take 1 tablet by mouth daily. [name]  ketoconazole (NIZORAL) 2 % cream, Apply 1 application topically 2 (two) times daily as needed for irritation.  .  Miconazole Nitrate (LOTRIMIN AF DEODORANT POWDER) 2 % AERP, Apply 1 spray topically 2 (two) times daily. .  Misc Natural Products (TART CHERRY ADVANCED PO), Take 1 tablet by mouth daily. .  pantoprazole (PROTONIX) 40 MG tablet, TAKE 1 TABLET BY MOUTH EVERY DAY .  polyethylene glycol powder (GLYCOLAX/MIRALAX) powder, Take 17 g by mouth daily. (Patient taking differently: Take 17 g by mouth 2 (two) times daily. ) .  Probiotic Product (ALIGN PO), Take 7.5 g by mouth daily.   Reviewed prior external information including notes and imaging from  primary care provider As well as notes that were available from care everywhere and other healthcare systems.  Past medical history, social, surgical and family history all reviewed in electronic medical record.  No pertanent information unless stated regarding to the chief complaint.   Review of Systems:  No headache, visual changes, nausea, vomiting, diarrhea, constipation, dizziness, abdominal pain, skin rash, fevers, chills, night sweats, weight loss, swollen lymph nodes, body aches, joint swelling, chest pain, shortness of breath, mood changes. POSITIVE muscle aches  Objective  Blood pressure (!) 108/52, pulse 76, height 5\' 5"  (1.651 m), weight 209 lb (94.8 kg), SpO2 98 %.   General: No apparent distress alert and oriented x3 mood and  affect normal, dressed appropriately.  HEENT: Pupils equal, extraocular movements intact  Respiratory: Patient's speak  in full sentences and does not appear short of breath  Cardiovascular: No lower extremity edema, non tender, no erythema  Neuro: Cranial nerves II through XII are intact, neurovascularly intact in all extremities with 2+ DTRs and 2+ pulses.  Gait antalgic MSK: Left elbow shows that there is pain over the lateral epicondylar region.  Mild loss of 5 degrees in all planes with the elbow.  Patient has good grip strength but pain with resisted extension of the elbow.   Knee: Left valgus deformity noted. Large thigh to calf ratio.  Tender to palpation over medial and PF joint line.  ROM full in flexion and extension and lower leg rotation. instability with valgus force.  painful patellar compression. Patellar glide with moderate crepitus. Patellar and quadriceps tendons unremarkable. Hamstring and quadriceps strength is normal.  After informed written and verbal consent, patient was seated on exam table. Left knee was prepped with alcohol swab and utilizing anterolateral approach, patient's left knee space was injected with Monovisc (sodium hyaluronate) in a prefilled syringe was injected easily into the knee through a 22-gauge needle..Patient tolerated the procedure well without immediate complications.    Impression and Recommendations:     The above documentation has been reviewed and is accurate and complete Lyndal Pulley, DO       Note: This dictation was prepared with Dragon dictation along with smaller phrase technology. Any transcriptional errors that result from this process are unintentional.

## 2019-09-05 NOTE — Patient Instructions (Addendum)
Good to see you Lift with thumbs up  See me again in 6-8 weeks

## 2019-09-07 ENCOUNTER — Encounter: Payer: Self-pay | Admitting: Family Medicine

## 2019-09-13 DIAGNOSIS — J3081 Allergic rhinitis due to animal (cat) (dog) hair and dander: Secondary | ICD-10-CM | POA: Diagnosis not present

## 2019-09-13 DIAGNOSIS — J3089 Other allergic rhinitis: Secondary | ICD-10-CM | POA: Diagnosis not present

## 2019-09-13 DIAGNOSIS — J301 Allergic rhinitis due to pollen: Secondary | ICD-10-CM | POA: Diagnosis not present

## 2019-09-14 ENCOUNTER — Telehealth: Payer: Self-pay

## 2019-09-14 ENCOUNTER — Telehealth: Payer: Self-pay | Admitting: Family Medicine

## 2019-09-14 NOTE — Telephone Encounter (Signed)
Pt called and stated that they feel like the repatha is causing an allergic reaction. Would like to speak to pharmacist. Will route to the pharmd pool

## 2019-09-14 NOTE — Telephone Encounter (Signed)
Pt called stating he has experienced a reaction after taking his Repatha Injection yesterday. Pt wanted to let Dr. Yong Channel know so they can discuss next steps on a new cholesterol medication. Please advise.

## 2019-09-14 NOTE — Telephone Encounter (Signed)
Thanks so much for caring for him. Our next visit is in December it appears and we will repeat lipids- suspect they will elevate significantly off repatha but I did not find another obvious cause of symptoms during visit and with bloodwork and correlation with doses of repatha is concerning to me.

## 2019-09-14 NOTE — Telephone Encounter (Signed)
Spoke with patient and patient's wife.  Patient reports has had a conversation with his PCP regarding his symptoms.  Reports he had been feeling exhausted, run down, and then noticed his eye had started drooping.  These symptoms all happened while on Repatha.  Decision was made to skip a dose of Repatha and patient said within a week he felt much better and symptoms had completely subsided.  Yesterday, took his next scheduled Repatha injection and symptoms have returned.  Last LDL: 18, (11/03/2018).  Patient reports intolerance to statins.  Recommended patient d/c Repatha since LDL is well below goal.  Recommended patient to schedule appointment with PCP for assessment to see if perhaps symptoms had a different cause and run updated lipid panel.  If LDL begins to increase off medication, next step would be trial of Praluent.  Patient voiced understanding.

## 2019-09-15 NOTE — Telephone Encounter (Signed)
FYI

## 2019-09-18 NOTE — Telephone Encounter (Signed)
There is another medicine called praluent that could be considered through the lipid clinic.  The lipid clinic contacted me last week-we are going to plan to repeat your cholesterol at next visit-I know we have a visit in December but if you would prefer to do a 28-month visit for sooner recheck we could certainly do that.  If numbers go up we will need to switch to trial Praluent

## 2019-09-19 ENCOUNTER — Telehealth: Payer: Self-pay | Admitting: Family Medicine

## 2019-09-19 NOTE — Telephone Encounter (Signed)
Patient called stating that when he took his elbow brace off the pain returned. He wanted to know if he should continue to wear the brace or if he should do anything else to help the pain?

## 2019-09-20 ENCOUNTER — Other Ambulatory Visit: Payer: Self-pay | Admitting: Family Medicine

## 2019-09-20 NOTE — Telephone Encounter (Signed)
Sent patient MyChart message.

## 2019-09-21 NOTE — Telephone Encounter (Signed)
Spoke to pt told him Dr, Yong Channel said There is another medicine called praluent that could be considered through the lipid clinic.  The lipid clinic contacted me last week-we are going to plan to repeat your cholesterol at next visit-I know we have a visit in December but if you would prefer to do a 55-month visit for sooner recheck we could certainly do that.  If numbers go up we will need to switch to trial Praluent. Pt said he would like to move appt up to 3 months to be checked sooner. Told pt okay. Appt scheduled for 12/25/2019 at 8:40 AM with Dr. Yong Channel. Pt verbalized understanding.

## 2019-09-24 DIAGNOSIS — G4733 Obstructive sleep apnea (adult) (pediatric): Secondary | ICD-10-CM | POA: Diagnosis not present

## 2019-09-27 DIAGNOSIS — J301 Allergic rhinitis due to pollen: Secondary | ICD-10-CM | POA: Diagnosis not present

## 2019-09-27 DIAGNOSIS — J3081 Allergic rhinitis due to animal (cat) (dog) hair and dander: Secondary | ICD-10-CM | POA: Diagnosis not present

## 2019-09-27 DIAGNOSIS — J3089 Other allergic rhinitis: Secondary | ICD-10-CM | POA: Diagnosis not present

## 2019-09-29 DIAGNOSIS — G4733 Obstructive sleep apnea (adult) (pediatric): Secondary | ICD-10-CM | POA: Diagnosis not present

## 2019-10-02 DIAGNOSIS — J3081 Allergic rhinitis due to animal (cat) (dog) hair and dander: Secondary | ICD-10-CM | POA: Diagnosis not present
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
## 2019-10-03 ENCOUNTER — Other Ambulatory Visit: Payer: Self-pay

## 2019-10-03 ENCOUNTER — Ambulatory Visit: Payer: PPO | Admitting: Neurology

## 2019-10-03 ENCOUNTER — Encounter: Payer: Self-pay | Admitting: Neurology

## 2019-10-03 VITALS — BP 107/60 | HR 80 | Ht 65.0 in | Wt 211.2 lb

## 2019-10-03 DIAGNOSIS — G609 Hereditary and idiopathic neuropathy, unspecified: Secondary | ICD-10-CM | POA: Diagnosis not present

## 2019-10-03 DIAGNOSIS — R5383 Other fatigue: Secondary | ICD-10-CM

## 2019-10-03 MED ORDER — GABAPENTIN 300 MG PO CAPS: ORAL_CAPSULE | ORAL | 3 refills | Status: DC

## 2019-10-03 NOTE — Patient Instructions (Addendum)
Always good to see you! Continue gabapentin 300mg  as you are taking it, we can increase if needed. Try the lidocaine cream to avoid drowsiness. Discuss fatigue with your cardiologist as well. Follow-up in 6-8 months, call for any changes.

## 2019-10-13 DIAGNOSIS — J3089 Other allergic rhinitis: Secondary | ICD-10-CM | POA: Diagnosis not present

## 2019-10-13 DIAGNOSIS — J301 Allergic rhinitis due to pollen: Secondary | ICD-10-CM | POA: Diagnosis not present

## 2019-10-13 DIAGNOSIS — J3081 Allergic rhinitis due to animal (cat) (dog) hair and dander: Secondary | ICD-10-CM | POA: Diagnosis not present

## 2019-10-24 ENCOUNTER — Ambulatory Visit: Payer: PPO | Admitting: Family Medicine

## 2019-10-24 ENCOUNTER — Other Ambulatory Visit: Payer: Self-pay

## 2019-10-24 ENCOUNTER — Encounter: Payer: Self-pay | Admitting: Family Medicine

## 2019-10-24 DIAGNOSIS — M5442 Lumbago with sciatica, left side: Secondary | ICD-10-CM

## 2019-10-24 DIAGNOSIS — M1712 Unilateral primary osteoarthritis, left knee: Secondary | ICD-10-CM

## 2019-10-24 DIAGNOSIS — G8929 Other chronic pain: Secondary | ICD-10-CM

## 2019-10-24 DIAGNOSIS — M7712 Lateral epicondylitis, left elbow: Secondary | ICD-10-CM | POA: Diagnosis not present

## 2019-10-24 NOTE — Assessment & Plan Note (Signed)
Patient is doing much better at this time.  Discussed with patient icing regimen and home exercises.  Patient will stay active.  Worsening pain will go back to the steroid injection the patient did respond very well to viscosupplementation

## 2019-10-24 NOTE — Assessment & Plan Note (Signed)
Significant improvement at this time.  No need to make any significant changes.  Patient will continue to wear the brace at night that seems to be doing well and is not stopping him from any activities during the day.

## 2019-10-24 NOTE — Patient Instructions (Signed)
Happy you are doing well Stay active See me in 3 months or call if need me sooner

## 2019-10-24 NOTE — Progress Notes (Signed)
[facility] [place] Phone: [phone] Subjective:   Joshua [name], am serving as a scribe for Dr. Hulan Saas. This visit occurred during the SARS-CoV-2 public health emergency.  Safety protocols were in place, including screening questions prior to the visit, additional usage of staff PPE, and extensive cleaning of exam room while observing appropriate contact time as indicated for disinfecting solutions.   I'm seeing this patient by the request  of:  [name], MD  CC: Knee pain and elbow pain follow-up  VZD:[id number]   09/05/2019 Viscosupplementation given today, tolerated the procedure well, discussed icing regimen and home exercise.  Discussed the importance of weight loss, patient will do bracing.  Follow-up again in 4 to 8 weeks  Elbow anatomy was reviewed, and tendinopathy was explained.  Pt. given a home rehab program. Start with isometrics and ROM, then a series of concentric and eccentric exercises should be done starting with [name] weight, work up to 1 lb, hammer, etc.  Use counterforce strap if working or using hands.  Formal PT would be beneficial. Emphasized stretching an cross-friction massage Emphasized proper palms up lifting biomechanics to unload ECRB Follow-up again in 4 to 6 weeks may need physical therapy  Update 10/24/2019 Joshua [name] is a 79 y.o. male coming in with complaint of left knee and left elbow pain. Patient states that his knee pain has subsided. The elbow pain is improving. Wears brace at night for pain relief. Does not care for the custom brace.  Patient states about [name] longer having significant pain.  Able to do more walking.  What stops him from walking at this time is more the lower back.      Past Medical History:  Diagnosis Date  . Acute medial meniscal tear   . Anemia   . Arthritis    "middle finger right hand; right knee; neck" (02/06/2014)  . Asthma   .  Bladder cancer ([place]) 04/2010   "cauterized during prostate OR"  . CAD (coronary artery disease)    CABG 2001  . Depression   . Diverticulosis   . DJD (degenerative joint disease)    BACK  . GERD (gastroesophageal reflux disease)   . H/O inguinal hernia repair 12/2018  . History of gout   . Hyperlipidemia   . Hypertension   . IBS (irritable bowel syndrome)   . Obesity   . OSA (obstructive sleep apnea)    USES CPAP   . Pancreatitis ~ 1980  . Prostate cancer ([place]) 04/2010   Past Surgical History:  Procedure Laterality Date  . ANTERIOR CERVICAL DECOMP/DISCECTOMY FUSION  05/26/2006   PLATE PLACED  . CARDIAC CATHETERIZATION  11/20/1999  . CORONARY ARTERY BYPASS GRAFT  11/20/1999   SVG-RI1-RI2, SVG-OM, SVG-dRCA  . CORONARY STENT INTERVENTION N/A 04/13/2019   Procedure: CORONARY STENT INTERVENTION;  Surgeon: Burnell Blanks, MD;  Location: [facility];  Service: Cardiovascular;  Laterality: N/A;  . INGUINAL HERNIA REPAIR Left 01/02/2019   Procedure: OPEN LEFT INGUINAL HERNIA REPAIR WITH MESH;  Surgeon: Johnathan Hausen, MD;  Location: [facility];  Service: General;  Laterality: Left;  . KNEE ARTHROSCOPY Right 1992; 04/2008  . LAPAROSCOPIC CHOLECYSTECTOMY  2007  . LEFT HEART CATH AND CORS/GRAFTS ANGIOGRAPHY N/A 04/13/2019   Procedure: LEFT HEART CATH AND CORS/GRAFTS ANGIOGRAPHY;  Surgeon: Burnell Blanks, MD;  Location: [facility];  Service: Cardiovascular;  Laterality: N/A;  . NASAL SEPTUM SURGERY Right 2007  .  ROBOT ASSISTED LAPAROSCOPIC RADICAL PROSTATECTOMY  04/2010  . THYROIDECTOMY, PARTIAL  1980   Social History   Socioeconomic History  . Marital status: Married    Spouse name: Not on file  . Number of children: Not on file  . Years of education: Not on file  . Highest education level: Not on file  Occupational History  . Occupation: retired    Fish farm manager: RETIRED  Tobacco Use  . Smoking status: Former Smoker    Packs/day: 1.00    Years: 35.00     Pack years: 35.00    Types: Cigarettes    Quit date: 06/16/1992    Years since quitting: 27.3  . Smokeless tobacco: Never Used  Vaping Use  . Vaping Use: Never used  Substance and Sexual Activity  . Alcohol use: Yes    Alcohol/week: 7.0 standard drinks    Types: 7 Shots of liquor per week    Comment: mixed drink Q DAY  . Drug use: [name]  . Sexual activity: Not Currently    Partners: Female  Other Topics Concern  . Not on file  Social History Narrative   Married 42 years in 2015. [name] kids (mumps at age 93)      Retired from [employer] in Engineer, technical sales   Volunteers at [employer]: poker, movies and tv, staying active      Right handed   2 level home with steps he uses   Social Determinants of Radio broadcast assistant Strain: [place]   . Difficulty of Paying Living Expenses: Not hard at all  Food Insecurity:   . Worried About Charity fundraiser in the Last Year:   . Arboriculturist in the Last Year:   Transportation Needs: [name] Transportation Needs  . Lack of Transportation (Medical): [name]  . Lack of Transportation (Non-Medical): [name]  Physical Activity: Unknown  . Days of Exercise per Week: 5 days  . Minutes of Exercise per Session: Not on file  Stress:   . Feeling of Stress :   Social Connections:   . Frequency of Communication with Friends and Family:   . Frequency of Social Gatherings with Friends and Family:   . Attends Religious Services:   . Active Member of Clubs or Organizations:   . Attends Archivist Meetings:   [name] Marital Status:    Allergies  Allergen Reactions  . 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 Bromfed Nausea And Vomiting  . Clarithromycin Other (See Comments)     gastritis  . Codeine Other (See Comments)    Trouble breathing  . Doxycycline Hyclate Nausea And Vomiting  . Modafinil Other (See Comments)     anxiety-nervousness  . Oxycodone-Acetaminophen Itching  .  Promethazine Hcl Other (See Comments)     fainting  . Quinolones Nausea Only    Cipro "felt real bad"  . Telithromycin Nausea And Vomiting  . Hydrocodone-Acetaminophen Rash  . Statins Other (See Comments)    Leg cramps and makes patient feel bad. Per pt tried multiple in years past  . Sulfonamide Derivatives Rash   Family History  Problem Relation Age of Onset  . Heart disease Mother   . Hyperlipidemia Mother   . Heart disease Father   . Hyperlipidemia Father   . Diabetes Brother      Current Outpatient Medications (Cardiovascular):  .  amLODipine (NORVASC) 5 MG tablet, TAKE  1 TABLET BY MOUTH EVERY DAY .  EPINEPHrine 0.3 mg/0.3 mL IJ SOAJ injection, See admin instructions. for allergic reaction .  Evolocumab (REPATHA SURECLICK) 893 MG/ML SOAJ, Inject 140 mg into the skin every 14 (fourteen) days. .  hydrochlorothiazide (HYDRODIURIL) 25 MG tablet, TAKE 1 TABLET BY MOUTH EVERY DAY .  losartan (COZAAR) 100 MG tablet, TAKE 1 TABLET BY MOUTH EVERY DAY  Current Outpatient Medications (Respiratory):  .  albuterol (PROAIR HFA) 108 (90 BASE) MCG/ACT inhaler, Inhale 1-2 puffs into the lungs every 6 (six) hours as needed for wheezing or shortness of breath. (Patient taking differently: Inhale 1-2 puffs into the lungs as needed for wheezing or shortness of breath. ) .  FLOVENT HFA 110 MCG/ACT inhaler, Inhale 1 puff into the lungs 2 (two) times daily.  .  fluticasone (FLONASE) 50 MCG/ACT nasal spray, Place 1 spray into both nostrils 2 (two) times daily.  [name]  loratadine (CLARITIN) 10 MG tablet, Take 10 mg by mouth at bedtime.   Current Outpatient Medications (Analgesics):  .  acetaminophen (TYLENOL) 500 MG tablet, Take 1,000 mg by mouth as needed for moderate pain or headache.  [name]  aspirin EC 81 MG tablet, Take 81 mg by mouth at bedtime.  .  traMADol (ULTRAM) 50 MG tablet, Take 1 tablet (50 mg total) by mouth every 6 (six) hours as needed.  Current Outpatient Medications (Hematological):  .   clopidogrel (PLAVIX) 300 MG TABS tablet,  .  clopidogrel (PLAVIX) 75 MG tablet, Take 1 tablet (75 mg total) by mouth daily with breakfast. .  folic acid (FOLVITE) 810 MCG tablet, Take 800 mcg by mouth daily.  .  vitamin B-12 (CYANOCOBALAMIN) 1000 MCG tablet, Take 1,000 mcg by mouth daily.  Current Outpatient Medications (Other):  [name]  Cholecalciferol (VITAMIN D3) 50 MCG (2000 UT) TABS, Take 2,000 Units by mouth daily. .  diazepam (VALIUM) 5 MG tablet, TAKE 1 TABLET (5 MG TOTAL) BY MOUTH AT BEDTIME AS NEEDED. [name]  gabapentin (NEURONTIN) 300 MG capsule, Take 1 cap in AM, 1 cap at noon, 2 caps at bedtime .  Glucosamine-Chondroitin (MOVE FREE PO), Take 1 tablet by mouth daily. [name]  ketoconazole (NIZORAL) 2 % cream, Apply 1 application topically 2 (two) times daily as needed for irritation.  .  Miconazole Nitrate (LOTRIMIN AF DEODORANT POWDER) 2 % AERP, Apply 1 spray topically 2 (two) times daily. .  Misc Natural Products (TART CHERRY ADVANCED PO), Take 1 tablet by mouth daily. .  pantoprazole (PROTONIX) 40 MG tablet, TAKE 1 TABLET BY MOUTH EVERY DAY .  polyethylene glycol powder (GLYCOLAX/MIRALAX) powder, Take 17 g by mouth daily. (Patient taking differently: Take 17 g by mouth 2 (two) times daily. ) .  Probiotic Product (ALIGN PO), Take 7.5 g by mouth daily.   Reviewed prior external information including notes and imaging from  primary care provider As well as notes that were available from care everywhere and other healthcare systems.  Past medical history, social, surgical and family history all reviewed in electronic medical record.  [name] pertanent information unless stated regarding to the chief complaint.   Review of Systems:  [name] headache, visual changes, nausea, vomiting, diarrhea, constipation, dizziness, abdominal pain, skin rash, fevers, chills, night sweats, weight loss, swollen lymph nodes, , joint swelling, chest pain, shortness of breath, mood changes. POSITIVE muscle aches, body  aches  Objective  Blood pressure 112/76, pulse 76, height 5\' 5"  (1.651 m), weight 212 lb (96.2 kg), SpO2 96 %.   General: [name] apparent  distress alert and oriented x3 mood and affect normal, dressed appropriately.  HEENT: Pupils equal, extraocular movements intact  Respiratory: Patient's speak in full sentences and does not appear short of breath  Gait very minorly antalgic Patient's left knee still has some instability with valgus and varus force but [name] swelling noted.  Lacks last 5 degrees of extension and flexion.  Left elbow exam still has some very mild pain with resisted extension over the lateral epicondylar region.  Full range of motion of the elbow noted.  Good grip strength.  Neurovascularly intact distally  Deferred back exam with patient comfortable sitting in the chair   Impression and Recommendations:     The above documentation has been reviewed and is accurate and complete Joshua Pulley, DO       Note: This dictation was prepared with Dragon dictation along with smaller phrase technology. Any transcriptional errors that result from this process are unintentional.

## 2019-10-24 NOTE — Assessment & Plan Note (Signed)
Continues to have intermittent pain but is on a blood thinner.  Wants to hold while patient is still on the blood thinner until he was to the possibility of epidurals.

## 2019-10-25 DIAGNOSIS — G4733 Obstructive sleep apnea (adult) (pediatric): Secondary | ICD-10-CM | POA: Diagnosis not present

## 2019-10-25 DIAGNOSIS — J3089 Other allergic rhinitis: Secondary | ICD-10-CM | POA: Diagnosis not present

## 2019-10-25 DIAGNOSIS — J301 Allergic rhinitis due to pollen: Secondary | ICD-10-CM | POA: Diagnosis not present

## 2019-10-25 DIAGNOSIS — J3081 Allergic rhinitis due to animal (cat) (dog) hair and dander: Secondary | ICD-10-CM | POA: Diagnosis not present

## 2019-10-30 DIAGNOSIS — J3089 Other allergic rhinitis: Secondary | ICD-10-CM | POA: Diagnosis not present

## 2019-10-30 DIAGNOSIS — J301 Allergic rhinitis due to pollen: Secondary | ICD-10-CM | POA: Diagnosis not present

## 2019-10-30 DIAGNOSIS — J3081 Allergic rhinitis due to animal (cat) (dog) hair and dander: Secondary | ICD-10-CM | POA: Diagnosis not present

## 2019-11-01 ENCOUNTER — Encounter: Payer: Self-pay | Admitting: Family Medicine

## 2019-11-07 DIAGNOSIS — G4733 Obstructive sleep apnea (adult) (pediatric): Secondary | ICD-10-CM | POA: Diagnosis not present

## 2019-11-07 DIAGNOSIS — J3081 Allergic rhinitis due to animal (cat) (dog) hair and dander: Secondary | ICD-10-CM | POA: Diagnosis not present

## 2019-11-07 DIAGNOSIS — J3089 Other allergic rhinitis: Secondary | ICD-10-CM | POA: Diagnosis not present

## 2019-11-07 DIAGNOSIS — J301 Allergic rhinitis due to pollen: Secondary | ICD-10-CM | POA: Diagnosis not present

## 2019-11-14 DIAGNOSIS — H2000 Unspecified acute and subacute iridocyclitis: Secondary | ICD-10-CM | POA: Diagnosis not present

## 2019-11-15 DIAGNOSIS — J3081 Allergic rhinitis due to animal (cat) (dog) hair and dander: Secondary | ICD-10-CM | POA: Diagnosis not present

## 2019-11-15 DIAGNOSIS — J3089 Other allergic rhinitis: Secondary | ICD-10-CM | POA: Diagnosis not present

## 2019-11-15 DIAGNOSIS — J301 Allergic rhinitis due to pollen: Secondary | ICD-10-CM | POA: Diagnosis not present

## 2019-11-16 ENCOUNTER — Encounter: Payer: Self-pay | Admitting: Family Medicine

## 2019-11-21 DIAGNOSIS — H2000 Unspecified acute and subacute iridocyclitis: Secondary | ICD-10-CM | POA: Diagnosis not present

## 2019-11-22 ENCOUNTER — Telehealth: Payer: Self-pay | Admitting: Family Medicine

## 2019-11-22 NOTE — Telephone Encounter (Signed)
Patient is needing medication for incontinence and would like to discuss more with a nurse he would like a return phone call after 12 today if possible regarding this

## 2019-11-23 NOTE — Patient Instructions (Signed)
Health Maintenance Due  Topic Date Due  . INFLUENZA VACCINE -please let us know when you have gotten this. 10/22/2019   Depression screen PHQ 2/9 09/04/2019 08/25/2019 06/30/2019  Decreased Interest 0 3 0  Down, Depressed, Hopeless 3 1 1   PHQ - 2 Score 3 4 1   Altered sleeping 1 3 -  Tired, decreased energy 3 3 -  Change in appetite 0 0 -  Feeling bad or failure about yourself  0 0 -  Trouble concentrating 0 0 -  Moving slowly or fidgety/restless 0 0 -  Suicidal thoughts 0 0 -  PHQ-9 Score 7 10 -  Difficult doing work/chores Not difficult at all Somewhat difficult -  Some recent data might be hidden

## 2019-11-23 NOTE — Progress Notes (Signed)
Phone [phone] Virtual visit via Video note   Subjective:  Chief complaint: Urinary Incontinence Chief Complaint  Patient presents with  . Urinary Incontinence    Pt has been having this problem for years and wears depends, pt has been doing exercises. Pt says it has gotten worse past 4-5 months and would like to discuss medication.    This visit type was conducted due to national recommendations for restrictions regarding the COVID-19 Pandemic (e.g. social distancing).  This format is felt to be most appropriate for this patient at this time balancing risks to patient and risks to population by having him in for in person visit.  No physical exam was performed (except for noted visual exam or audio findings with Telehealth visits).    Our team/I connected with Joshua [name] at  1:00 PM EDT by a video enabled telemedicine application (doxy.me or caregility through epic) and verified that I am speaking with the correct person using two identifiers.  Location patient: [facility] Location provider: [facility], office Persons participating in the virtual visit:  patient  Our team/I discussed the limitations of evaluation and management by telemedicine and the availability of in person appointments. In light of current covid-19 pandemic, patient also understands that we are trying to protect them by minimizing in office contact if at all possible.  The patient expressed consent for telemedicine visit and agreed to proceed. Patient understands insurance will be billed.   Past Medical History-  Patient Active Problem List   Diagnosis Date Noted  . Coronary atherosclerosis 10/12/2006    Priority: High  . Irritable bowel syndrome 10/12/2006    Priority: High  . Overactive bladder 11/24/2019    Priority: Medium  . Hyperglycemia 10/07/2017    Priority: Medium  . Gout 11/20/2016    Priority: Medium  . CKD (chronic kidney disease), stage III ([place]) 10/23/2013    Priority: Medium  .  Neuropathy ([place]) 08/21/2013    Priority: Medium  . History of bladder cancer 04/01/2010    Priority: Medium  . Essential hypertension 02/21/2007    Priority: Medium  . Hyperlipidemia 10/12/2006    Priority: Medium  . Depression, major, single episode, in partial remission ([place]) 10/12/2006    Priority: Medium  . Asthma 10/12/2006    Priority: Medium  . Aortic atherosclerosis ([place]) 05/05/2018    Priority: Low  . DJD (degenerative joint disease)     Priority: Low  . Arthritis     Priority: Low  . Neck pain 02/08/2017    Priority: Low  . Venous insufficiency 12/29/2016    Priority: Low  . Ganglion cyst of tendon sheath of right hand 12/29/2016    Priority: Low  . Avulsion fracture of metatarsal bone of right foot 11/25/2016    Priority: Low  . Obesity, Class I, BMI 30-34.9 10/03/2015    Priority: Low  . Allergic rhinitis 05/31/2014    Priority: Low  . Fatigue 11/09/2013    Priority: Low  . Dizziness 08/23/2013    Priority: Low  . RLS (restless legs syndrome) 01/03/2013    Priority: Low  . Chronic bilateral low back pain with left-sided sciatica 10/09/2011    Priority: Low  . OSTEOARTHRITIS, LOWER LEG, LEFT 10/04/2009    Priority: Low  . ANEMIA, IRON DEFICIENCY 11/06/2008    Priority: Low  . 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 OSA (obstructive sleep apnea) 11/02/2006    Priority: Low  . GERD 10/12/2006    Priority: Low  .  Left lateral epicondylitis 09/05/2019  . Degenerative arthritis of left knee 08/08/2019  . Educated about COVID-19 virus infection 04/20/2019  . Unstable angina ([place])   . Greater trochanteric bursitis of left hip 10/27/2018  . Degenerative disc disease, lumbar 10/27/2018  . Abdominal muscle strain, initial encounter 06/16/2018  . Weakness of left leg 06/21/2017  . Hereditary and idiopathic peripheral neuropathy 09/25/2014    Medications- reviewed and updated Current Outpatient Medications  Medication Sig Dispense Refill   . acetaminophen (TYLENOL) 500 MG tablet Take 1,000 mg by mouth as needed for moderate pain or headache.     . albuterol (PROAIR HFA) 108 (90 BASE) MCG/ACT inhaler Inhale 1-2 puffs into the lungs every 6 (six) hours as needed for wheezing or shortness of breath. (Patient taking differently: Inhale 1-2 puffs into the lungs as needed for wheezing or shortness of breath. ) 18 g 3  . amLODipine (NORVASC) 5 MG tablet TAKE 1 TABLET BY MOUTH EVERY DAY 90 tablet 3  . aspirin EC 81 MG tablet Take 81 mg by mouth at bedtime.     . Cholecalciferol (VITAMIN D3) 50 MCG (2000 UT) TABS Take 2,000 Units by mouth daily.    . clopidogrel (PLAVIX) 75 MG tablet Take 1 tablet (75 mg total) by mouth daily with breakfast. 90 tablet 3  . diazepam (VALIUM) 5 MG tablet TAKE 1 TABLET (5 MG TOTAL) BY MOUTH AT BEDTIME AS NEEDED. 30 tablet 1  . EPINEPHrine 0.3 mg/0.3 mL IJ SOAJ injection See admin instructions. for allergic reaction    . FLOVENT HFA 110 MCG/ACT inhaler Inhale 1 puff into the lungs 2 (two) times daily.   5  . fluticasone (FLONASE) 50 MCG/ACT nasal spray Place 1 spray into both nostrils 2 (two) times daily.     . folic acid (FOLVITE) 027 MCG tablet Take 800 mcg by mouth daily.     [name] gabapentin (NEURONTIN) 300 MG capsule Take 1 cap in AM, 1 cap at noon, 2 caps at bedtime 360 capsule 3  . hydrochlorothiazide (HYDRODIURIL) 25 MG tablet TAKE 1 TABLET BY MOUTH EVERY DAY 90 tablet 2  . ketoconazole (NIZORAL) 2 % cream Apply 1 application topically 2 (two) times daily as needed for irritation.     [name] loratadine (CLARITIN) 10 MG tablet Take 10 mg by mouth at bedtime.     [name] losartan (COZAAR) 100 MG tablet TAKE 1 TABLET BY MOUTH EVERY DAY 90 tablet 3  . Miconazole Nitrate (LOTRIMIN AF DEODORANT POWDER) 2 % AERP Apply 1 spray topically 2 (two) times daily.    . pantoprazole (PROTONIX) 40 MG tablet TAKE 1 TABLET BY MOUTH EVERY DAY 90 tablet 1  . polyethylene glycol powder (GLYCOLAX/MIRALAX) powder Take 17 g by mouth daily.  (Patient taking differently: Take 17 g by mouth 2 (two) times daily. ) 255 g 0  . prednisoLONE acetate (PRED FORTE) 1 % ophthalmic suspension Place into the left eye.    . Probiotic Product (ALIGN PO) Take 7.5 g by mouth daily.    . traMADol (ULTRAM) 50 MG tablet Take 1 tablet (50 mg total) by mouth every 6 (six) hours as needed. 20 tablet 1  . vitamin B-12 (CYANOCOBALAMIN) 1000 MCG tablet Take 1,000 mcg by mouth daily.    . mirabegron ER (MYRBETRIQ) 25 MG TB24 tablet Take 1 tablet (25 mg total) by mouth daily. 30 tablet 5   No current facility-administered medications for this visit.     Objective:  Ht 5\' 5"  (1.651 m)  Wt 208 lb (94.3 kg)   BMI 34.61 kg/m  self reported vitals Gen: NAD, resting comfortably Lungs: nonlabored, normal respiratory rate  Skin: appears dry, no obvious rash     Assessment and Plan   #Incontinence/overactive bladder- in patient with history of prostate/bladder cancer fully treated S:Issues date back to prostatectomy and bladder cancer cautery in 2012.  Worsening symptoms 2021 and wanted to try medication-recommended against oxybutynin due to beers list and potential increased fatigue-opts to try Myrbetriq A/P: Patient with frequent urination/overactive bladder/urinary incontinence-seems to be worse in last several months-declines coming by for UTI testing.  He would like to try medication to help with this.  We discussed oxybutynin potential side effects and the fact it is on the beers list after discussion he would like to hold off on this medication.  I discussed finasteride would not be beneficial due to prostate removal.  We did discuss Myrbetriq-some caution given CAD history and potential increased risk of stroke (uptodate lists <1%) as well as increase of his blood pressure.  He agrees to watch blood pressure carefully and follow-up with me in 1 to 2 months in person so we can recheck in person  # flu shot- receives this today. He will let me know whether  this was high dose or regular dose  Recommended follow up: 1-2 months Future Appointments  Date Time Provider [place]  12/07/2019  1:40 PM [name], MD [facility]  12/25/2019  8:40 AM [name], MD [facility]  01/24/2020  9:00 AM Lyndal Pulley, DO [facility] None  02/23/2020  8:20 AM [name], MD [facility]  05/01/2020 10:00 AM Cameron Sprang, MD [facility] None    Lab/Order associations:   ICD-10-CM   1. Overactive bladder  N32.81     Meds ordered this encounter  Medications  . mirabegron ER (MYRBETRIQ) 25 MG TB24 tablet    Sig: Take 1 tablet (25 mg total) by mouth daily.    Dispense:  30 tablet    Refill:  5   Return precautions advised.  [name], MD

## 2019-11-23 NOTE — Telephone Encounter (Signed)
Patient is follow up about this. He would like a return call

## 2019-11-23 NOTE — Telephone Encounter (Signed)
FYI: Called and spoke with pt and he states he has been experiencing incontinence ever since his surgery a year ago and he thinks he has weakening of his rectal muscles. Pt states he spoke with a friend that has the same issue and his friend was given a medicine that starts with an "O" (pt is going to contact friend to get the full name of med) and is wondering if he could try that. I offered pt virtual visit and pt accepted, virtual scheduled for tomorrow.

## 2019-11-24 ENCOUNTER — Encounter: Payer: Self-pay | Admitting: Family Medicine

## 2019-11-24 ENCOUNTER — Other Ambulatory Visit: Payer: Self-pay

## 2019-11-24 ENCOUNTER — Telehealth (INDEPENDENT_AMBULATORY_CARE_PROVIDER_SITE_OTHER): Payer: PPO | Admitting: Family Medicine

## 2019-11-24 DIAGNOSIS — N3281 Overactive bladder: Secondary | ICD-10-CM | POA: Insufficient documentation

## 2019-11-24 MED ORDER — MIRABEGRON ER 25 MG PO TB24: 25.0000 mg | ORAL_TABLET | Freq: Every day | ORAL | 5 refills | Status: DC

## 2019-11-24 NOTE — Assessment & Plan Note (Signed)
#  Incontinence/overactive bladder- in patient with history of prostate/bladder cancer fully treated S:Issues date back to prostatectomy and bladder cancer cautery in 2012. Pt has been having this problem for years and wears depends or pads, pt has been doing exercises through urology- sometimes this actually worsens symptom- limits to 4 exercises that dont cause as much of an issue.   Random incontinence- much more rarely with bending over. Used to get upset with it but getting more used to it.   Asks about oxybutynin 10mg  and finasteride.  He has a friend that has been taking these. A/P: Patient with frequent urination/overactive bladder/urinary incontinence-seems to be worse in last several months-declines coming by for UTI testing.  He would like to try medication to help with this.  We discussed oxybutynin potential side effects and the fact it is on the beers list after discussion he would like to hold off on this medication.  I discussed finasteride would not be beneficial due to prostate removal.  We did discuss Myrbetriq-some caution given CAD history and potential increased risk of stroke as well as increase of his blood pressure.  He agrees to watch blood pressure carefully and follow-up with me in 1 to 2 months in person so we can recheck in person

## 2019-11-25 DIAGNOSIS — G4733 Obstructive sleep apnea (adult) (pediatric): Secondary | ICD-10-CM | POA: Diagnosis not present

## 2019-11-28 ENCOUNTER — Encounter: Payer: Self-pay | Admitting: Family Medicine

## 2019-11-28 DIAGNOSIS — J301 Allergic rhinitis due to pollen: Secondary | ICD-10-CM | POA: Diagnosis not present

## 2019-11-28 DIAGNOSIS — J3081 Allergic rhinitis due to animal (cat) (dog) hair and dander: Secondary | ICD-10-CM | POA: Diagnosis not present

## 2019-11-28 DIAGNOSIS — J3089 Other allergic rhinitis: Secondary | ICD-10-CM | POA: Diagnosis not present

## 2019-12-05 ENCOUNTER — Ambulatory Visit: Payer: PPO | Admitting: Cardiology

## 2019-12-06 NOTE — Progress Notes (Signed)
Cardiology Office Note   Date:  12/07/2019   ID:  Joshua [name], DOB [birth year], MRN [id number]  PCP:  [name], MD  Cardiologist:   [name], MD    Chief Complaint  Patient presents with  . Coronary Artery Disease      History of Present Illness: Joshua [name] is a 79 y.o. male who presents for follow up of CAD s/p CABG, HTN, HLD, pancreatitis and OSA on CPAP. She had a negative perfusion study in 2015.  He had chest pain in January 2020 and routine treadmill stress test showing no ischemia.    Due to recurrent chest discomfort, patient eventually underwent cardiac catheterization on 04/13/2019 that revealed severe triple-vessel coronary artery disease s/p 5 vessel bypass with 5 out of 5 patent bypass grafts.  The native LAD was occluded beyond the first diagonal branch.  Patent LIMA graft. The circumflex is occluded at the ostium.  The moderate caliber distal OM branch fills from the patent vein graft.  The small caliber more proximal OM fills retrograde from collaterals.  The native RCA is occluded proximally.  The vein graft to the distal RCA is patent. A DES was successfully placed to origin to proximal graft lesion saphenous graft to third marginal.  Patient had normal left ventricular systolic function.  He was discharged on aspirin and Plavix.  He has had fatigue and did not tolerate beta blocker.  She has been intolerant of Repatha.  Since I last saw him he has done well.  He does a recumbent bike.  He walks up and down stairs. The patient denies any new symptoms such as chest discomfort, neck or arm discomfort. There has been no new shortness of breath, PND or orthopnea. There have been no reported palpitations, presyncope or syncope.     Past Medical History:  Diagnosis Date  . Acute medial meniscal tear   . Anemia   . Arthritis    "middle finger right hand; right knee; neck" (02/06/2014)  . Asthma   . Bladder cancer ([place]) 04/2010   "cauterized  during prostate OR"  . CAD (coronary artery disease)    CABG 2001  . Depression   . Diverticulosis   . DJD (degenerative joint disease)    BACK  . GERD (gastroesophageal reflux disease)   . H/O inguinal hernia repair 12/2018  . History of gout   . Hyperlipidemia   . Hypertension   . IBS (irritable bowel syndrome)   . Obesity   . OSA (obstructive sleep apnea)    USES CPAP   . Pancreatitis ~ 1980  . Prostate cancer ([place]) 04/2010    Past Surgical History:  Procedure Laterality Date  . ANTERIOR CERVICAL DECOMP/DISCECTOMY FUSION  05/26/2006   PLATE PLACED  . CARDIAC CATHETERIZATION  11/20/1999  . CORONARY ARTERY BYPASS GRAFT  11/20/1999   SVG-RI1-RI2, SVG-OM, SVG-dRCA  . CORONARY STENT INTERVENTION N/A 04/13/2019   Procedure: CORONARY STENT INTERVENTION;  Surgeon: Burnell Blanks, MD;  Location: [facility];  Service: Cardiovascular;  Laterality: N/A;  . INGUINAL HERNIA REPAIR Left 01/02/2019   Procedure: OPEN LEFT INGUINAL HERNIA REPAIR WITH MESH;  Surgeon: Johnathan Hausen, MD;  Location: [facility];  Service: General;  Laterality: Left;  . KNEE ARTHROSCOPY Right 1992; 04/2008  . LAPAROSCOPIC CHOLECYSTECTOMY  2007  . LEFT HEART CATH AND CORS/GRAFTS ANGIOGRAPHY N/A 04/13/2019   Procedure: LEFT HEART CATH AND CORS/GRAFTS ANGIOGRAPHY;  Surgeon: Burnell Blanks, MD;  Location: [facility];  Service: Cardiovascular;  Laterality: N/A;  . NASAL SEPTUM SURGERY Right 2007  . ROBOT ASSISTED LAPAROSCOPIC RADICAL PROSTATECTOMY  04/2010  . THYROIDECTOMY, PARTIAL  1980     Current Outpatient Medications  Medication Sig Dispense Refill  . acetaminophen (TYLENOL) 500 MG tablet Take 1,000 mg by mouth as needed for moderate pain or headache.     . albuterol (PROAIR HFA) 108 (90 BASE) MCG/ACT inhaler Inhale 1-2 puffs into the lungs every 6 (six) hours as needed for wheezing or shortness of breath. (Patient taking differently: Inhale 1-2 puffs into the lungs as needed for  wheezing or shortness of breath. ) 18 g 3  . amLODipine (NORVASC) 5 MG tablet TAKE 1 TABLET BY MOUTH EVERY DAY 90 tablet 3  . aspirin EC 81 MG tablet Take 81 mg by mouth at bedtime.     . Cholecalciferol (VITAMIN D3) 50 MCG (2000 UT) TABS Take 2,000 Units by mouth daily.    . clopidogrel (PLAVIX) 75 MG tablet Take 1 tablet (75 mg total) by mouth daily with breakfast. 90 tablet 3  . diazepam (VALIUM) 5 MG tablet TAKE 1 TABLET (5 MG TOTAL) BY MOUTH AT BEDTIME AS NEEDED. 30 tablet 1  . EPINEPHrine 0.3 mg/0.3 mL IJ SOAJ injection See admin instructions. for allergic reaction    . FLOVENT HFA 110 MCG/ACT inhaler Inhale 1 puff into the lungs 2 (two) times daily.   5  . fluticasone (FLONASE) 50 MCG/ACT nasal spray Place 1 spray into both nostrils 2 (two) times daily.     . folic acid (FOLVITE) 161 MCG tablet Take 800 mcg by mouth daily.     [name] gabapentin (NEURONTIN) 300 MG capsule Take 1 cap in AM, 1 cap at noon, 2 caps at bedtime 360 capsule 3  . hydrochlorothiazide (HYDRODIURIL) 25 MG tablet TAKE 1 TABLET BY MOUTH EVERY DAY 90 tablet 2  . ketoconazole (NIZORAL) 2 % cream Apply 1 application topically 2 (two) times daily as needed for irritation.     [name] loratadine (CLARITIN) 10 MG tablet Take 10 mg by mouth at bedtime.     [name] losartan (COZAAR) 100 MG tablet TAKE 1 TABLET BY MOUTH EVERY DAY 90 tablet 3  . Miconazole Nitrate (LOTRIMIN AF DEODORANT POWDER) 2 % AERP Apply 1 spray topically 2 (two) times daily.    . mirabegron ER (MYRBETRIQ) 25 MG TB24 tablet Take 1 tablet (25 mg total) by mouth daily. 30 tablet 5  . pantoprazole (PROTONIX) 40 MG tablet TAKE 1 TABLET BY MOUTH EVERY DAY 90 tablet 1  . polyethylene glycol powder (GLYCOLAX/MIRALAX) powder Take 17 g by mouth daily. (Patient taking differently: Take 17 g by mouth 2 (two) times daily. ) 255 g 0  . prednisoLONE acetate (PRED FORTE) 1 % ophthalmic suspension Place into the left eye.    . Probiotic Product (ALIGN PO) Take 7.5 g by mouth daily.    .  traMADol (ULTRAM) 50 MG tablet Take 1 tablet (50 mg total) by mouth every 6 (six) hours as needed. 20 tablet 1  . vitamin B-12 (CYANOCOBALAMIN) 1000 MCG tablet Take 1,000 mcg by mouth daily.    . nitroGLYCERIN (NITROSTAT) 0.4 MG SL tablet Place 1 tablet (0.4 mg total) under the tongue every 5 (five) minutes as needed. 25 tablet 3   No current facility-administered medications for this visit.    Allergies:   Benadryl [diphenhydramine], Bromfed, Clarithromycin, Codeine, Doxycycline hyclate, Modafinil, Oxycodone-acetaminophen, Promethazine hcl, Quinolones, Telithromycin, Hydrocodone-acetaminophen, Statins, and Sulfonamide derivatives  ROS:  Please see the history of present illness.   Otherwise, review of systems are positive for none.   All other systems are reviewed and negative.    PHYSICAL EXAM: VS:  BP 130/78   Pulse 74   Temp (!) 95.4 F (35.2 C)   Ht 5' (1.524 m)   Wt 216 lb (98 kg)   SpO2 97%   BMI 42.18 kg/m  , BMI Body mass index is 42.18 kg/m. GENERAL:  Well appearing NECK:  No jugular venous distention, waveform within normal limits, carotid upstroke brisk and symmetric, no bruits, no thyromegaly LUNGS:  Clear to auscultation bilaterally CHEST:  Well healed sternotomy scar. HEART:  PMI not displaced or sustained,S1 and S2 within normal limits, no S3, no S4, no clicks, no rubs, no murmurs ABD:  Flat, positive bowel sounds normal in frequency in pitch, no bruits, no rebound, no guarding, no midline pulsatile mass, no hepatomegaly, no splenomegaly EXT:  2 plus pulses throughout, mild ankle edema, no cyanosis no clubbing   EKG:  EKG is not ordered today.    Recent Labs: 08/25/2019: ALT 17; BUN 18; Creatinine, Ser 1.22; Hemoglobin 13.8; Platelets 234.0; Potassium 3.9; Sodium 134; TSH 0.99    Lipid Panel    Component Value Date/Time   CHOL 96 11/03/2018 0806   CHOL 81 (L) 12/30/2016 0848   TRIG 158.0 (H) 11/03/2018 0806   HDL 46.20 11/03/2018 0806   HDL 42  12/30/2016 0848   CHOLHDL 2 11/03/2018 0806   VLDL 31.6 11/03/2018 0806   LDLCALC 18 11/03/2018 0806   LDLCALC 16 10/01/2017 1703   LDLDIRECT 115.0 06/04/2016 0919      Wt Readings from Last 3 Encounters:  12/07/19 216 lb (98 kg)  11/24/19 208 lb (94.3 kg)  10/24/19 212 lb (96.2 kg)      Other studies Reviewed: Additional studies/ records that were reviewed today include:   None. Review of the above records demonstrates:  Please see elsewhere in the note.     ASSESSMENT AND PLAN:  CAD s/p CABG: Patient underwent cardiac catheterization recently on 04/13/2019 and had PCI DES to SVG to OM 3.   We we will continue with risk reduction.  No change in therapy.  Hyperlipidemia:   He does not tolerates statins or PCSK9.  I am going to get fasting lipid profile and refer him to see Dr. Debara Pickett.   Hypertension:   The BP is at target.  No change in therapy.  Obstructive sleep apnea:   He is on CPAP.    COVID : He has been vaccinated.   Current medicines are reviewed at length with the patient today.  The patient does not have concerns regarding medicines.  The following changes have been made:  no change  Labs/ tests ordered today include:   Orders Placed This Encounter  Procedures  . Lipid panel  . AMB Referral to [facility]     Disposition:   FU with me in six months.     Signed, [name], MD  12/07/2019 2:44 PM    [place]

## 2019-12-07 ENCOUNTER — Ambulatory Visit: Payer: PPO | Admitting: Cardiology

## 2019-12-07 ENCOUNTER — Encounter: Payer: Self-pay | Admitting: Cardiology

## 2019-12-07 ENCOUNTER — Other Ambulatory Visit: Payer: Self-pay

## 2019-12-07 VITALS — BP 130/78 | HR 74 | Temp 95.4°F | Ht 60.0 in | Wt 216.0 lb

## 2019-12-07 DIAGNOSIS — E785 Hyperlipidemia, unspecified: Secondary | ICD-10-CM

## 2019-12-07 DIAGNOSIS — I10 Essential (primary) hypertension: Secondary | ICD-10-CM

## 2019-12-07 DIAGNOSIS — Z7189 Other specified counseling: Secondary | ICD-10-CM | POA: Diagnosis not present

## 2019-12-07 DIAGNOSIS — G4730 Sleep apnea, unspecified: Secondary | ICD-10-CM

## 2019-12-07 DIAGNOSIS — I2510 Atherosclerotic heart disease of native coronary artery without angina pectoris: Secondary | ICD-10-CM

## 2019-12-07 MED ORDER — NITROGLYCERIN 0.4 MG SL SUBL: 0.4000 mg | SUBLINGUAL_TABLET | SUBLINGUAL | 3 refills | Status: DC | PRN

## 2019-12-07 NOTE — Patient Instructions (Signed)
Medication Instructions:  Your physician recommends that you continue on your current medications as directed. Please refer to the Current Medication list given to you today.  *If you need a refill on your cardiac medications before your next appointment, please call your pharmacy*   Lab Work: Please return for FASTING labs (Lipid)  Our in office lab hours are Monday-Friday 8:00-4:00, closed for lunch 12:45-1:45 pm.  No appointment needed.  Follow-Up: At [facility], you and your health needs are our priority.  As part of our continuing mission to provide you with exceptional heart care, we have created designated Provider Care Teams.  These Care Teams include your primary Cardiologist (physician) and Advanced Practice Providers (APPs -  Physician Assistants and Nurse Practitioners) who all work together to provide you with the care you need, when you need it.  We recommend signing up for the patient portal called "MyChart".  Sign up information is provided on this After Visit Summary.  MyChart is used to connect with patients for Virtual Visits (Telemedicine).  Patients are able to view lab/test results, encounter notes, upcoming appointments, etc.  Non-urgent messages can be sent to your provider as well.   To learn more about what you can do with MyChart, go to [email or web address].    Your next appointment:   6 month(s)  The format for your next appointment:   In Person  Provider:   [name], MD   Other Instructions You have been referred to Dr. Debara Pickett for cholesterol management

## 2019-12-08 DIAGNOSIS — I2510 Atherosclerotic heart disease of native coronary artery without angina pectoris: Secondary | ICD-10-CM | POA: Diagnosis not present

## 2019-12-08 DIAGNOSIS — E785 Hyperlipidemia, unspecified: Secondary | ICD-10-CM | POA: Diagnosis not present

## 2019-12-08 LAB — LIPID PANEL
Chol/HDL Ratio: 5.7 ratio — ABNORMAL HIGH (ref 0.0–5.0)
Cholesterol, Total: 215 mg/dL — ABNORMAL HIGH (ref 100–199)
HDL: 38 mg/dL — ABNORMAL LOW (ref >39)
LDL Chol Calc (NIH): 134 mg/dL — ABNORMAL HIGH (ref 0–99)
Triglycerides: 240 mg/dL — ABNORMAL HIGH (ref 0–149)
VLDL Cholesterol Cal: 43 mg/dL — ABNORMAL HIGH (ref 5–40)

## 2019-12-13 DIAGNOSIS — J3081 Allergic rhinitis due to animal (cat) (dog) hair and dander: Secondary | ICD-10-CM | POA: Diagnosis not present

## 2019-12-13 DIAGNOSIS — J3089 Other allergic rhinitis: Secondary | ICD-10-CM | POA: Diagnosis not present

## 2019-12-13 DIAGNOSIS — J301 Allergic rhinitis due to pollen: Secondary | ICD-10-CM | POA: Diagnosis not present

## 2019-12-15 DIAGNOSIS — J3089 Other allergic rhinitis: Secondary | ICD-10-CM | POA: Diagnosis not present

## 2019-12-15 DIAGNOSIS — J301 Allergic rhinitis due to pollen: Secondary | ICD-10-CM | POA: Diagnosis not present

## 2019-12-18 ENCOUNTER — Encounter: Payer: Self-pay | Admitting: Physician Assistant

## 2019-12-18 ENCOUNTER — Other Ambulatory Visit: Payer: Self-pay | Admitting: Medical

## 2019-12-18 ENCOUNTER — Other Ambulatory Visit: Payer: Self-pay

## 2019-12-18 ENCOUNTER — Ambulatory Visit (INDEPENDENT_AMBULATORY_CARE_PROVIDER_SITE_OTHER): Payer: PPO | Admitting: Physician Assistant

## 2019-12-18 VITALS — BP 140/70 | HR 88 | Temp 97.6°F | Ht 60.0 in | Wt 213.0 lb

## 2019-12-18 DIAGNOSIS — J3081 Allergic rhinitis due to animal (cat) (dog) hair and dander: Secondary | ICD-10-CM | POA: Diagnosis not present

## 2019-12-18 DIAGNOSIS — G4733 Obstructive sleep apnea (adult) (pediatric): Secondary | ICD-10-CM | POA: Diagnosis not present

## 2019-12-18 DIAGNOSIS — H6982 Other specified disorders of Eustachian tube, left ear: Secondary | ICD-10-CM

## 2019-12-18 DIAGNOSIS — J301 Allergic rhinitis due to pollen: Secondary | ICD-10-CM | POA: Diagnosis not present

## 2019-12-18 DIAGNOSIS — J3089 Other allergic rhinitis: Secondary | ICD-10-CM | POA: Diagnosis not present

## 2019-12-18 NOTE — Progress Notes (Signed)
Joshua [name] is a 79 y.o. male here for a new problem.  I acted as a Education administrator for [employer], PA-C Anselmo Pickler, LPN   History of Present Illness:   Chief Complaint  Patient presents with  . Otalgia    HPI   Otalgia Pt is c/o pain left ear, started 2 weeks ago. Pt was on a plane recently and he experienced L ear pressure when he landed and this has continued. Denies drainage. Has tried OTC ear drops no relief. Denies fever of chills.  Feels like he has muffled hearing at times. Overall symptoms are getting slightly better with time.   Currently using Flonase regularly. Taking claritin at night, has done so regularly for a few years.  Past Medical History:  Diagnosis Date  . Acute medial meniscal tear   . Anemia   . Arthritis    "middle finger right hand; right knee; neck" (02/06/2014)  . Asthma   . Bladder cancer ([place]) 04/2010   "cauterized during prostate OR"  . CAD (coronary artery disease)    CABG 2001  . Depression   . Diverticulosis   . DJD (degenerative joint disease)    BACK  . GERD (gastroesophageal reflux disease)   . H/O inguinal hernia repair 12/2018  . History of gout   . Hyperlipidemia   . Hypertension   . IBS (irritable bowel syndrome)   . Obesity   . OSA (obstructive sleep apnea)    USES CPAP   . Pancreatitis ~ 1980  . Prostate cancer ([place]) 04/2010     Social History   Tobacco Use  . Smoking status: Former Smoker    Packs/day: 1.00    Years: 35.00    Pack years: 35.00    Types: Cigarettes    Quit date: 06/16/1992    Years since quitting: 27.5  . Smokeless tobacco: Never Used  Vaping Use  . Vaping Use: Never used  Substance Use Topics  . Alcohol use: Yes    Alcohol/week: 7.0 standard drinks    Types: 7 Shots of liquor per week    Comment: mixed drink Q DAY  . Drug use: No    Past Surgical History:  Procedure Laterality Date  . ANTERIOR CERVICAL DECOMP/DISCECTOMY FUSION  05/26/2006   PLATE PLACED  . CARDIAC CATHETERIZATION   11/20/1999  . CORONARY ARTERY BYPASS GRAFT  11/20/1999   SVG-RI1-RI2, SVG-OM, SVG-dRCA  . CORONARY STENT INTERVENTION N/A 04/13/2019   Procedure: CORONARY STENT INTERVENTION;  Surgeon: Burnell Blanks, MD;  Location: [place];  Service: Cardiovascular;  Laterality: N/A;  . INGUINAL HERNIA REPAIR Left 01/02/2019   Procedure: OPEN LEFT INGUINAL HERNIA REPAIR WITH MESH;  Surgeon: Johnathan Hausen, MD;  Location: [facility];  Service: General;  Laterality: Left;  . KNEE ARTHROSCOPY Right 1992; 04/2008  . LAPAROSCOPIC CHOLECYSTECTOMY  2007  . LEFT HEART CATH AND CORS/GRAFTS ANGIOGRAPHY N/A 04/13/2019   Procedure: LEFT HEART CATH AND CORS/GRAFTS ANGIOGRAPHY;  Surgeon: Burnell Blanks, MD;  Location: [place];  Service: Cardiovascular;  Laterality: N/A;  . NASAL SEPTUM SURGERY Right 2007  . ROBOT ASSISTED LAPAROSCOPIC RADICAL PROSTATECTOMY  04/2010  . THYROIDECTOMY, PARTIAL  1980    Family History  Problem Relation Age of Onset  . Heart disease Mother   . Hyperlipidemia Mother   . Heart disease Father   . Hyperlipidemia Father   . Diabetes Brother     Allergies  Allergen Reactions  . Benadryl [Diphenhydramine] Other (See Comments)  Pt told not to take because of bypass surgery  . Bromfed Nausea And Vomiting  . Clarithromycin Other (See Comments)     gastritis  . Codeine Other (See Comments)    Trouble breathing  . Doxycycline Hyclate Nausea And Vomiting  . Modafinil Other (See Comments)     anxiety-nervousness  . Oxycodone-Acetaminophen Itching  . Promethazine Hcl Other (See Comments)     fainting  . Quinolones Nausea Only    Cipro "felt real bad"  . Telithromycin Nausea And Vomiting  . Hydrocodone-Acetaminophen Rash  . Statins Other (See Comments)    Leg cramps and makes patient feel bad. Per pt tried multiple in years past  . Sulfonamide Derivatives Rash    Current Medications:   Current Outpatient Medications:  .  Simvastatin 20 MG/5ML  SUSP, , Disp: , Rfl:  .  acetaminophen (TYLENOL) 500 MG tablet, Take 1,000 mg by mouth as needed for moderate pain or headache. , Disp: , Rfl:  .  albuterol (PROAIR HFA) 108 (90 BASE) MCG/ACT inhaler, Inhale 1-2 puffs into the lungs every 6 (six) hours as needed for wheezing or shortness of breath. (Patient taking differently: Inhale 1-2 puffs into the lungs as needed for wheezing or shortness of breath. ), Disp: 18 g, Rfl: 3 .  amLODipine (NORVASC) 5 MG tablet, TAKE 1 TABLET BY MOUTH EVERY DAY, Disp: 90 tablet, Rfl: 3 .  aspirin EC 81 MG tablet, Take 81 mg by mouth at bedtime. , Disp: , Rfl:  .  Cholecalciferol (VITAMIN D3) 50 MCG (2000 UT) TABS, Take 2,000 Units by mouth daily., Disp: , Rfl:  .  clopidogrel (PLAVIX) 75 MG tablet, Take 1 tablet (75 mg total) by mouth daily with breakfast., Disp: 90 tablet, Rfl: 3 .  diazepam (VALIUM) 5 MG tablet, TAKE 1 TABLET (5 MG TOTAL) BY MOUTH AT BEDTIME AS NEEDED., Disp: 30 tablet, Rfl: 1 .  EPINEPHrine 0.3 mg/0.3 mL IJ SOAJ injection, See admin instructions. for allergic reaction, Disp: , Rfl:  .  FLOVENT HFA 110 MCG/ACT inhaler, Inhale 1 puff into the lungs 2 (two) times daily. , Disp: , Rfl: 5 .  fluticasone (FLONASE) 50 MCG/ACT nasal spray, Place 1 spray into both nostrils 2 (two) times daily. , Disp: , Rfl:  .  folic acid (FOLVITE) 937 MCG tablet, Take 800 mcg by mouth daily. , Disp: , Rfl:  .  gabapentin (NEURONTIN) 300 MG capsule, Take 1 cap in AM, 1 cap at noon, 2 caps at bedtime, Disp: 360 capsule, Rfl: 3 .  hydrochlorothiazide (HYDRODIURIL) 25 MG tablet, TAKE 1 TABLET BY MOUTH EVERY DAY, Disp: 90 tablet, Rfl: 2 .  ketoconazole (NIZORAL) 2 % cream, Apply 1 application topically 2 (two) times daily as needed for irritation. , Disp: , Rfl:  .  loratadine (CLARITIN) 10 MG tablet, Take 10 mg by mouth at bedtime. , Disp: , Rfl:  .  losartan (COZAAR) 100 MG tablet, TAKE 1 TABLET BY MOUTH EVERY DAY, Disp: 90 tablet, Rfl: 3 .  Miconazole Nitrate (LOTRIMIN AF  DEODORANT POWDER) 2 % AERP, Apply 1 spray topically 2 (two) times daily., Disp: , Rfl:  .  mirabegron ER (MYRBETRIQ) 25 MG TB24 tablet, Take 1 tablet (25 mg total) by mouth daily., Disp: 30 tablet, Rfl: 5 .  nitroGLYCERIN (NITROSTAT) 0.4 MG SL tablet, Place 1 tablet (0.4 mg total) under the tongue every 5 (five) minutes as needed., Disp: 25 tablet, Rfl: 3 .  pantoprazole (PROTONIX) 40 MG tablet, TAKE 1 TABLET BY MOUTH  EVERY DAY, Disp: 90 tablet, Rfl: 1 .  polyethylene glycol powder (GLYCOLAX/MIRALAX) powder, Take 17 g by mouth daily. (Patient taking differently: Take 17 g by mouth 2 (two) times daily. ), Disp: 255 g, Rfl: 0 .  prednisoLONE acetate (PRED FORTE) 1 % ophthalmic suspension, Place into the left eye., Disp: , Rfl:  .  Probiotic Product (ALIGN PO), Take 7.5 g by mouth daily., Disp: , Rfl:  .  traMADol (ULTRAM) 50 MG tablet, Take 1 tablet (50 mg total) by mouth every 6 (six) hours as needed., Disp: 20 tablet, Rfl: 1 .  vitamin B-12 (CYANOCOBALAMIN) 1000 MCG tablet, Take 1,000 mcg by mouth daily., Disp: , Rfl:    Review of Systems:   ROS Negative unless otherwise specified per HPI.  Vitals:   Vitals:   12/18/19 1356  BP: 140/70  Pulse: 88  Temp: 97.6 F (36.4 C)  TempSrc: Temporal  SpO2: 96%  Weight: 213 lb (96.6 kg)  Height: 5' (1.524 m)     Body mass index is 41.6 kg/m.  Physical Exam:   Physical Exam Vitals and nursing note reviewed.  Constitutional:      General: He is not in acute distress.    Appearance: He is well-developed. He is not ill-appearing or toxic-appearing.  HENT:     Head: Normocephalic and atraumatic.     Right Ear: Tympanic membrane, ear canal and external ear normal. Tympanic membrane is not erythematous, retracted or bulging.     Left Ear: Ear canal and external ear normal. A middle ear effusion (very slight clear liquid) is present. Tympanic membrane is not erythematous, retracted or bulging.     Nose: Nose normal.     Right Sinus: No  maxillary sinus tenderness or frontal sinus tenderness.     Left Sinus: No maxillary sinus tenderness or frontal sinus tenderness.     Mouth/Throat:     Pharynx: Uvula midline. No oropharyngeal exudate or posterior oropharyngeal erythema.  Cardiovascular:     Rate and Rhythm: Normal rate and regular rhythm.     Heart sounds: Normal heart sounds.  Pulmonary:     Effort: Pulmonary effort is normal. No accessory muscle usage or respiratory distress.     Breath sounds: Normal breath sounds. No decreased breath sounds, wheezing, rhonchi or rales.  Lymphadenopathy:     Cervical: No cervical adenopathy.  Skin:    General: Skin is warm and dry.  Neurological:     Mental Status: He is alert.  Psychiatric:        Speech: Speech normal.        Behavior: Behavior is cooperative.     Assessment and Plan:   Nghia was seen today for otalgia.  Diagnoses and all orders for this visit:  Dysfunction of left eustachian tube   No red flags on exam. Symptoms are overall improving with time. Recommended waiting a few days to see if this will help symptoms, if not recommend stopping claritin and trialing xyzal. If no improvement from this, referral to ENT.  CMA or LPN served as scribe during this visit. History, Physical, and Plan performed by medical provider. The above documentation has been reviewed and is accurate and complete.   Inda Coke, PA-C

## 2019-12-18 NOTE — Patient Instructions (Signed)
It was great to see you!  Give this a few days to see if it can improve.  No obvious source of infection or need for cleaning your ears today.  Let's trial switching to Xyzal (levocetirizine) daily  -- this will replace your claritin.  If no improvement, the next step would be Ear, Nose, Throat doctor.  Keep [place] posted!  Take care,  Inda Coke PA-C

## 2019-12-22 DIAGNOSIS — J3081 Allergic rhinitis due to animal (cat) (dog) hair and dander: Secondary | ICD-10-CM | POA: Diagnosis not present

## 2019-12-22 DIAGNOSIS — J301 Allergic rhinitis due to pollen: Secondary | ICD-10-CM | POA: Diagnosis not present

## 2019-12-22 DIAGNOSIS — J3089 Other allergic rhinitis: Secondary | ICD-10-CM | POA: Diagnosis not present

## 2019-12-22 NOTE — Progress Notes (Signed)
Phone [phone] In person visit   Subjective:   Joshua [name] is a 79 y.o. year old very pleasant male patient who presents for/with See problem oriented charting Chief Complaint  Patient presents with  . Hyperlipidemia   This visit occurred during the SARS-CoV-2 public health emergency.  Safety protocols were in place, including screening questions prior to the visit, additional usage of staff PPE, and extensive cleaning of exam room while observing appropriate contact time as indicated for disinfecting solutions.   Past Medical History-  Patient Active Problem List   Diagnosis Date Noted  . Coronary atherosclerosis 10/12/2006    Priority: High  . Irritable bowel syndrome 10/12/2006    Priority: High  . Overactive bladder 11/24/2019    Priority: Medium  . Hyperglycemia 10/07/2017    Priority: Medium  . Gout 11/20/2016    Priority: Medium  . CKD (chronic kidney disease), stage III ([place]) 10/23/2013    Priority: Medium  . Neuropathy ([place]) 08/21/2013    Priority: Medium  . History of bladder cancer 04/01/2010    Priority: Medium  . Essential hypertension 02/21/2007    Priority: Medium  . Hyperlipidemia 10/12/2006    Priority: Medium  . Depression, major, single episode, in partial remission ([place]) 10/12/2006    Priority: Medium  . Asthma 10/12/2006    Priority: Medium  . Aortic atherosclerosis ([place]) 05/05/2018    Priority: Low  . DJD (degenerative joint disease)     Priority: Low  . Arthritis     Priority: Low  . Neck pain 02/08/2017    Priority: Low  . Venous insufficiency 12/29/2016    Priority: Low  . Ganglion cyst of tendon sheath of right hand 12/29/2016    Priority: Low  . Avulsion fracture of metatarsal bone of right foot 11/25/2016    Priority: Low  . Obesity, Class I, BMI 30-34.9 10/03/2015    Priority: Low  . Allergic rhinitis 05/31/2014    Priority: Low  . Fatigue 11/09/2013    Priority: Low  . Dizziness 08/23/2013    Priority: Low  . RLS  (restless legs syndrome) 01/03/2013    Priority: Low  . Chronic bilateral low back pain with left-sided sciatica 10/09/2011    Priority: Low  . OSTEOARTHRITIS, LOWER LEG, LEFT 10/04/2009    Priority: Low  . ANEMIA, IRON DEFICIENCY 11/06/2008    Priority: Low  . 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 OSA (obstructive sleep apnea) 11/02/2006    Priority: Low  . GERD 10/12/2006    Priority: Low  . Left lateral epicondylitis 09/05/2019  . Degenerative arthritis of left knee 08/08/2019  . Educated about COVID-19 virus infection 04/20/2019  . Unstable angina ([place])   . Greater trochanteric bursitis of left hip 10/27/2018  . Degenerative disc disease, lumbar 10/27/2018  . Abdominal muscle strain, initial encounter 06/16/2018  . Weakness of left leg 06/21/2017  . Hereditary and idiopathic peripheral neuropathy 09/25/2014    Medications- reviewed and updated Current Outpatient Medications  Medication Sig Dispense Refill  . acetaminophen (TYLENOL) 500 MG tablet Take 1,000 mg by mouth as needed for moderate pain or headache.     . albuterol (PROAIR HFA) 108 (90 BASE) MCG/ACT inhaler Inhale 1-2 puffs into the lungs every 6 (six) hours as needed for wheezing or shortness of breath. (Patient taking differently: Inhale 1-2 puffs into the lungs as needed for wheezing or shortness of breath. ) 18 g 3  . amLODipine (NORVASC) 5 MG tablet TAKE 1  TABLET BY MOUTH EVERY DAY 90 tablet 3  . aspirin EC 81 MG tablet Take 81 mg by mouth at bedtime.     . Cholecalciferol (VITAMIN D3) 50 MCG (2000 UT) TABS Take 2,000 Units by mouth daily.    . clopidogrel (PLAVIX) 75 MG tablet Take 1 tablet (75 mg total) by mouth daily with breakfast. 90 tablet 3  . diazepam (VALIUM) 5 MG tablet TAKE 1 TABLET (5 MG TOTAL) BY MOUTH AT BEDTIME AS NEEDED. 30 tablet 1  . EPINEPHrine 0.3 mg/0.3 mL IJ SOAJ injection See admin instructions. for allergic reaction    . FLOVENT HFA 110 MCG/ACT inhaler Inhale 1 puff  into the lungs 2 (two) times daily.   5  . fluticasone (FLONASE) 50 MCG/ACT nasal spray Place 1 spray into both nostrils 2 (two) times daily.     . folic acid (FOLVITE) 253 MCG tablet Take 800 mcg by mouth daily.     [name] gabapentin (NEURONTIN) 300 MG capsule Take 1 cap in AM, 1 cap at noon, 2 caps at bedtime 360 capsule 3  . hydrochlorothiazide (HYDRODIURIL) 25 MG tablet TAKE 1 TABLET BY MOUTH EVERY DAY 90 tablet 2  . ketoconazole (NIZORAL) 2 % cream Apply 1 application topically 2 (two) times daily as needed for irritation.     [name] loratadine (CLARITIN) 10 MG tablet Take 10 mg by mouth at bedtime.     [name] losartan (COZAAR) 100 MG tablet TAKE 1 TABLET BY MOUTH EVERY DAY 90 tablet 3  . Miconazole Nitrate (LOTRIMIN AF DEODORANT POWDER) 2 % AERP Apply 1 spray topically 2 (two) times daily.    . mirabegron ER (MYRBETRIQ) 25 MG TB24 tablet Take 1 tablet (25 mg total) by mouth daily. 30 tablet 5  . nitroGLYCERIN (NITROSTAT) 0.4 MG SL tablet Place 1 tablet (0.4 mg total) under the tongue every 5 (five) minutes as needed. 25 tablet 3  . pantoprazole (PROTONIX) 40 MG tablet TAKE 1 TABLET BY MOUTH EVERY DAY 90 tablet 1  . polyethylene glycol powder (GLYCOLAX/MIRALAX) powder Take 17 g by mouth daily. (Patient taking differently: Take 17 g by mouth 2 (two) times daily. ) 255 g 0  . prednisoLONE acetate (PRED FORTE) 1 % ophthalmic suspension Place into the left eye.    . Probiotic Product (ALIGN PO) Take 7.5 g by mouth daily.    . Simvastatin 20 MG/5ML SUSP     . traMADol (ULTRAM) 50 MG tablet Take 1 tablet (50 mg total) by mouth every 6 (six) hours as needed. 20 tablet 1  . vitamin B-12 (CYANOCOBALAMIN) 1000 MCG tablet Take 1,000 mcg by mouth daily.     No current facility-administered medications for this visit.     Objective:  BP 122/70   Pulse 81   Temp 97.9 F (36.6 C) (Temporal)   Resp 18   Ht 5' (1.524 m)   Wt 213 lb (96.6 kg)   SpO2 95%   BMI 41.60 kg/m  Gen: NAD, resting comfortably CV: RRR  no murmurs rubs or gallops Lungs: CTAB no crackles, wheeze, rhonchi Abdomen: soft/nontender/nondistended/normal bowel sounds.  Ext: trace edema Skin: warm, dry     Assessment and Plan   #CAD/hyperlipidemia-history of CABG x5- follows with Dr. Percival Spanish S: Compliant with Repatha  for lipids but had worsening fatigue on multiple occasions.  He is compliant with aspirin and Plavix as well.  He did restart simvastatin 20mg  recently -Stent drug eluting placed 04/13/19 Dr. Angelena Form -has been able to work out- reclined bicycle  A/P: CAD- no chest pain or shortness of breath. Remains on aspirin and plavix.  - lipids hopefully improving- too soon for lipid panel (prefer 6 weeks after start)   #% Neuropathy-follows with Dr. Amparo [place]: Compliant with gabapentin 300, 300, 600mg .  Burning/needle pain in the feet still- had a few good weeks but worsened with fall weather  A/P: unfortunately recurrent issues and worse in winter- continue current medicines.   - interested in support group- I reached out to one of my patients that runs a group to find best contact method  #Incontinence/overactive bladder- in patient with history of prostate/bladder cancer fully treated S:Issues date back to prostatectomy and bladder cancer cautery in 2012.  Worsening symptoms 2021 and wanted to try medication-recommended against oxybutynin due to beers list and potential increased fatigue-opted to try Myrbetriq note some improvement but very expensive $100 a month so taking every few days.    Recently referred internally by cardiology to Dr. Debara Pickett for lipid clinic evaluation A/P: ongoing incontinence issues- will refer to Dr. [name] for expert opinion. Hesitant to trial oxybutynin and mybetriq too expensive   # Eustachian tube - he opted to stay on claritin but still improving. Auto insufflation helps  #Low back pain- considering injections but waiting on approval from cardiology to get these done with Dr.  Tamala Julian  Recommended follow up:  Keep December physical Future Appointments  Date Time Provider [place]  01/24/2020  9:00 AM Lyndal Pulley, DO [facility] None  02/09/2020  8:45 AM Debara Pickett, Nadean Corwin, MD [facility]  02/23/2020  8:20 AM [name], MD [facility]  04/08/2020  9:20 AM [name], MD [facility]  05/01/2020 10:00 AM Cameron Sprang, MD [facility] None    Lab/Order associations:   ICD-10-CM   1. Hyperlipidemia, unspecified hyperlipidemia type  E78.5   2. Overactive bladder  N32.81 Ambulatory referral to Urology  3. Urinary incontinence, unspecified type  R32 Ambulatory referral to Urology  4. Atherosclerosis of native coronary artery of native heart without angina pectoris  I25.10   5. Neuropathy ([place])  G62.9    Return precautions advised.  [name], MD

## 2019-12-22 NOTE — Patient Instructions (Addendum)
We will call you within two weeks about your referral to Dr. McDiarmid of urology for overactive bladder/incontinence. If you do not hear within 3 weeks, give [place] a call.   Immunization History  Administered Date(s) Administered  . Fluad Quad(high Dose 65+) 11/21/2018  . Influenza Split 01/08/2012, 11/12/2014  . Influenza Whole 12/08/2007, 12/26/2008, 12/22/2010  . Influenza, High Dose Seasonal PF 11/16/2015, 01/01/2017, 11/24/2019  . Influenza,inj,Quad PF,6+ Mos 11/30/2012  . Influenza-Unspecified 01/12/2014, 12/21/2017  . PFIZER SARS-COV-2 Vaccination 04/28/2019, 05/23/2019, 12/21/2019  . Pneumococcal Conjugate-13 05/31/2014  . Pneumococcal Polysaccharide-23 06/04/2006, 07/15/2012  . Td 04/23/2005  . Tdap 02/18/2016  . Zoster 01/08/2012  . Zoster Recombinat (Shingrix) 01/31/2018, 04/02/2018

## 2019-12-25 ENCOUNTER — Other Ambulatory Visit: Payer: Self-pay

## 2019-12-25 ENCOUNTER — Ambulatory Visit (INDEPENDENT_AMBULATORY_CARE_PROVIDER_SITE_OTHER): Payer: PPO | Admitting: Family Medicine

## 2019-12-25 ENCOUNTER — Encounter: Payer: Self-pay | Admitting: Family Medicine

## 2019-12-25 VITALS — BP 122/70 | HR 81 | Temp 97.9°F | Resp 18 | Ht 60.0 in | Wt 213.0 lb

## 2019-12-25 DIAGNOSIS — E785 Hyperlipidemia, unspecified: Secondary | ICD-10-CM | POA: Diagnosis not present

## 2019-12-25 DIAGNOSIS — G629 Polyneuropathy, unspecified: Secondary | ICD-10-CM | POA: Diagnosis not present

## 2019-12-25 DIAGNOSIS — R32 Unspecified urinary incontinence: Secondary | ICD-10-CM | POA: Diagnosis not present

## 2019-12-25 DIAGNOSIS — N3281 Overactive bladder: Secondary | ICD-10-CM

## 2019-12-25 DIAGNOSIS — G4733 Obstructive sleep apnea (adult) (pediatric): Secondary | ICD-10-CM | POA: Diagnosis not present

## 2019-12-25 DIAGNOSIS — I2510 Atherosclerotic heart disease of native coronary artery without angina pectoris: Secondary | ICD-10-CM

## 2019-12-28 ENCOUNTER — Telehealth: Payer: Self-pay

## 2019-12-28 NOTE — Telephone Encounter (Signed)
Called and spoke with and made him aware we do not have any samples.

## 2019-12-28 NOTE — Telephone Encounter (Signed)
Patient is calling in asking if Dr.Hunter has any samples of mirabegron ER (MYRBETRIQ) 25 MG TB24 tablet for him, as it is too expensive for him to pay for.

## 2020-01-01 DIAGNOSIS — J3081 Allergic rhinitis due to animal (cat) (dog) hair and dander: Secondary | ICD-10-CM | POA: Diagnosis not present

## 2020-01-01 DIAGNOSIS — J301 Allergic rhinitis due to pollen: Secondary | ICD-10-CM | POA: Diagnosis not present

## 2020-01-01 DIAGNOSIS — J3089 Other allergic rhinitis: Secondary | ICD-10-CM | POA: Diagnosis not present

## 2020-01-08 DIAGNOSIS — J3081 Allergic rhinitis due to animal (cat) (dog) hair and dander: Secondary | ICD-10-CM | POA: Diagnosis not present

## 2020-01-08 DIAGNOSIS — J301 Allergic rhinitis due to pollen: Secondary | ICD-10-CM | POA: Diagnosis not present

## 2020-01-08 DIAGNOSIS — J3089 Other allergic rhinitis: Secondary | ICD-10-CM | POA: Diagnosis not present

## 2020-01-23 DIAGNOSIS — J3081 Allergic rhinitis due to animal (cat) (dog) hair and dander: Secondary | ICD-10-CM | POA: Diagnosis not present

## 2020-01-23 DIAGNOSIS — J4530 Mild persistent asthma, uncomplicated: Secondary | ICD-10-CM | POA: Diagnosis not present

## 2020-01-23 DIAGNOSIS — J3089 Other allergic rhinitis: Secondary | ICD-10-CM | POA: Diagnosis not present

## 2020-01-23 DIAGNOSIS — J301 Allergic rhinitis due to pollen: Secondary | ICD-10-CM | POA: Diagnosis not present

## 2020-01-24 ENCOUNTER — Encounter: Payer: Self-pay | Admitting: Family Medicine

## 2020-01-24 ENCOUNTER — Ambulatory Visit: Payer: Self-pay

## 2020-01-24 ENCOUNTER — Ambulatory Visit: Payer: PPO | Admitting: Family Medicine

## 2020-01-24 ENCOUNTER — Other Ambulatory Visit: Payer: Self-pay

## 2020-01-24 VITALS — BP 120/74 | HR 70 | Ht 60.0 in | Wt 226.0 lb

## 2020-01-24 DIAGNOSIS — M65312 Trigger thumb, left thumb: Secondary | ICD-10-CM | POA: Diagnosis not present

## 2020-01-24 DIAGNOSIS — M79645 Pain in left finger(s): Secondary | ICD-10-CM | POA: Diagnosis not present

## 2020-01-24 DIAGNOSIS — M1712 Unilateral primary osteoarthritis, left knee: Secondary | ICD-10-CM | POA: Diagnosis not present

## 2020-01-24 DIAGNOSIS — G8929 Other chronic pain: Secondary | ICD-10-CM

## 2020-01-24 DIAGNOSIS — M5442 Lumbago with sciatica, left side: Secondary | ICD-10-CM

## 2020-01-24 NOTE — Assessment & Plan Note (Signed)
Degenerative arthritis of the left knee.  Stable at the moment.  Continue to monitor.  Not using a brace.

## 2020-01-24 NOTE — Progress Notes (Signed)
[name] [address] [place] Phone: [phone] Subjective:   I Joshua [name] am serving as a Education administrator for Dr. Hulan Saas.  This visit occurred during the SARS-CoV-2 public health emergency.  Safety protocols were in place, including screening questions prior to the visit, additional usage of staff PPE, and extensive cleaning of exam room while observing appropriate contact time as indicated for disinfecting solutions.   I'm seeing this patient by the request  of:  [name], MD  CC: Back pain follow-up, pain, elbow pain follow-up  LDJ:[id number]   10/24/2019 Significant improvement at this time.  No need to make any significant changes.  Patient will continue to wear the brace at night that seems to be doing well and is not stopping him from any activities during the day.   Continues to have intermittent pain but is on a blood thinner.  Wants to hold while patient is still on the blood thinner until he was to the possibility of epidurals.  Patient is doing much better at this time.  Discussed with patient icing regimen and home exercises.  Patient will stay active.  Worsening pain will go back to the steroid injection the patient did respond very well to viscosupplementation  Update 01/24/2020 Joshua [name] is a 79 y.o. male coming in with complaint of left elbow, left knee and low back pain. Patient states Left thumb pain today. Painful with ADLs. States he woke up one morning and it hurt. Elbow is doing well. Knee is doing better. Hurts from time to time. Works out about 4 times a week. Back is still painful.     Taking gabapentin 300 in the morning, 300 in the afternoon and 600 at night  Regarding his back patient did undergo an epidural lumbar spine previously in August 2020 and then again in December 2020.  Past Medical History:  Diagnosis Date  . Acute medial meniscal tear   . Anemia   . Arthritis    "middle  finger right hand; right knee; neck" (02/06/2014)  . Asthma   . Bladder cancer ([place]) 04/2010   "cauterized during prostate OR"  . CAD (coronary artery disease)    CABG 2001  . Depression   . Diverticulosis   . DJD (degenerative joint disease)    BACK  . GERD (gastroesophageal reflux disease)   . H/O inguinal hernia repair 12/2018  . History of gout   . Hyperlipidemia   . Hypertension   . IBS (irritable bowel syndrome)   . Obesity   . OSA (obstructive sleep apnea)    USES CPAP   . Pancreatitis ~ 1980  . Prostate cancer ([place]) 04/2010   Past Surgical History:  Procedure Laterality Date  . ANTERIOR CERVICAL DECOMP/DISCECTOMY FUSION  05/26/2006   PLATE PLACED  . CARDIAC CATHETERIZATION  11/20/1999  . CORONARY ARTERY BYPASS GRAFT  11/20/1999   SVG-RI1-RI2, SVG-OM, SVG-dRCA  . CORONARY STENT INTERVENTION N/A 04/13/2019   Procedure: CORONARY STENT INTERVENTION;  Surgeon: Burnell Blanks, MD;  Location: [facility];  Service: Cardiovascular;  Laterality: N/A;  . INGUINAL HERNIA REPAIR Left 01/02/2019   Procedure: OPEN LEFT INGUINAL HERNIA REPAIR WITH MESH;  Surgeon: Johnathan Hausen, MD;  Location: [facility];  Service: General;  Laterality: Left;  . KNEE ARTHROSCOPY Right 1992; 04/2008  . LAPAROSCOPIC CHOLECYSTECTOMY  2007  . LEFT HEART CATH AND CORS/GRAFTS ANGIOGRAPHY N/A 04/13/2019   Procedure: LEFT HEART CATH AND CORS/GRAFTS ANGIOGRAPHY;  Surgeon:  Burnell Blanks, MD;  Location: [facility];  Service: Cardiovascular;  Laterality: N/A;  . NASAL SEPTUM SURGERY Right 2007  . ROBOT ASSISTED LAPAROSCOPIC RADICAL PROSTATECTOMY  04/2010  . THYROIDECTOMY, PARTIAL  1980   Social History   Socioeconomic History  . Marital status: Married    Spouse name: Not on file  . Number of children: Not on file  . Years of education: Not on file  . Highest education level: Not on file  Occupational History  . Occupation: retired    Fish farm manager: RETIRED  Tobacco Use  . Smoking  status: Former Smoker    Packs/day: 1.00    Years: 35.00    Pack years: 35.00    Types: Cigarettes    Quit date: 06/16/1992    Years since quitting: 27.6  . Smokeless tobacco: Never Used  Vaping Use  . Vaping Use: Never used  Substance and Sexual Activity  . Alcohol use: Yes    Alcohol/week: 7.0 standard drinks    Types: 7 Shots of liquor per week    Comment: mixed drink Q DAY  . Drug use: No  . Sexual activity: Not Currently    Partners: Female  Other Topics Concern  . Not on file  Social History Narrative   Married 42 years in 2015. No kids (mumps at age 32)      Retired from [employer] in Engineer, technical sales   Volunteers at [employer]: poker, movies and tv, staying active      Right handed   2 level home with steps he uses   Social Determinants of Radio broadcast assistant Strain: [place]   . Difficulty of Paying Living Expenses: Not hard at all  Food Insecurity:   . Worried About Charity fundraiser in the Last Year: Not on file  . Ran Out of Food in the Last Year: Not on file  Transportation Needs: No Transportation Needs  . Lack of Transportation (Medical): No  . Lack of Transportation (Non-Medical): No  Physical Activity: Unknown  . Days of Exercise per Week: 5 days  . Minutes of Exercise per Session: Not on file  Stress:   . Feeling of Stress : Not on file  Social Connections:   . Frequency of Communication with Friends and Family: Not on file  . Frequency of Social Gatherings with Friends and Family: Not on file  . Attends Religious Services: Not on file  . Active Member of Clubs or Organizations: Not on file  . Attends Archivist Meetings: Not on file  . Marital Status: Not on file   Allergies  Allergen Reactions  . 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 Bromfed Nausea And Vomiting  . Clarithromycin Other (See Comments)     gastritis  . Codeine Other (See Comments)    Trouble  breathing  . Doxycycline Hyclate Nausea And Vomiting  . Modafinil Other (See Comments)     anxiety-nervousness  . Oxycodone-Acetaminophen Itching  . Promethazine Hcl Other (See Comments)     fainting  . Quinolones Nausea Only    Cipro "felt real bad"  . Telithromycin Nausea And Vomiting  . Hydrocodone-Acetaminophen Rash  . Statins Other (See Comments)    Leg cramps and makes patient feel bad. Per pt tried multiple in years past  . Sulfonamide Derivatives Rash   Family History  Problem Relation Age of Onset  .  Heart disease Mother   . Hyperlipidemia Mother   . Heart disease Father   . Hyperlipidemia Father   . Diabetes Brother      Current Outpatient Medications (Cardiovascular):  .  amLODipine (NORVASC) 5 MG tablet, TAKE 1 TABLET BY MOUTH EVERY DAY .  EPINEPHrine 0.3 mg/0.3 mL IJ SOAJ injection, See admin instructions. for allergic reaction .  hydrochlorothiazide (HYDRODIURIL) 25 MG tablet, TAKE 1 TABLET BY MOUTH EVERY DAY .  losartan (COZAAR) 100 MG tablet, TAKE 1 TABLET BY MOUTH EVERY DAY .  nitroGLYCERIN (NITROSTAT) 0.4 MG SL tablet, Place 1 tablet (0.4 mg total) under the tongue every 5 (five) minutes as needed. .  Simvastatin 20 MG/5ML SUSP,   Current Outpatient Medications (Respiratory):  .  albuterol (PROAIR HFA) 108 (90 BASE) MCG/ACT inhaler, Inhale 1-2 puffs into the lungs every 6 (six) hours as needed for wheezing or shortness of breath. (Patient taking differently: Inhale 1-2 puffs into the lungs as needed for wheezing or shortness of breath. ) .  FLOVENT HFA 110 MCG/ACT inhaler, Inhale 1 puff into the lungs 2 (two) times daily.  .  fluticasone (FLONASE) 50 MCG/ACT nasal spray, Place 1 spray into both nostrils 2 (two) times daily.  [name]  loratadine (CLARITIN) 10 MG tablet, Take 10 mg by mouth at bedtime.   Current Outpatient Medications (Analgesics):  .  acetaminophen (TYLENOL) 500 MG tablet, Take 1,000 mg by mouth as needed for moderate pain or headache.  [name]  aspirin  EC 81 MG tablet, Take 81 mg by mouth at bedtime.  .  traMADol (ULTRAM) 50 MG tablet, Take 1 tablet (50 mg total) by mouth every 6 (six) hours as needed.  Current Outpatient Medications (Hematological):  .  clopidogrel (PLAVIX) 75 MG tablet, Take 1 tablet (75 mg total) by mouth daily with breakfast. .  folic acid (FOLVITE) 962 MCG tablet, Take 800 mcg by mouth daily.  .  vitamin B-12 (CYANOCOBALAMIN) 1000 MCG tablet, Take 1,000 mcg by mouth daily.  Current Outpatient Medications (Other):  [name]  Cholecalciferol (VITAMIN D3) 50 MCG (2000 UT) TABS, Take 2,000 Units by mouth daily. .  diazepam (VALIUM) 5 MG tablet, TAKE 1 TABLET (5 MG TOTAL) BY MOUTH AT BEDTIME AS NEEDED. [name]  gabapentin (NEURONTIN) 300 MG capsule, Take 1 cap in AM, 1 cap at noon, 2 caps at bedtime .  ketoconazole (NIZORAL) 2 % cream, Apply 1 application topically 2 (two) times daily as needed for irritation.  .  Miconazole Nitrate (LOTRIMIN AF DEODORANT POWDER) 2 % AERP, Apply 1 spray topically 2 (two) times daily. .  mirabegron ER (MYRBETRIQ) 25 MG TB24 tablet, Take 1 tablet (25 mg total) by mouth daily. .  pantoprazole (PROTONIX) 40 MG tablet, TAKE 1 TABLET BY MOUTH EVERY DAY .  polyethylene glycol powder (GLYCOLAX/MIRALAX) powder, Take 17 g by mouth daily. (Patient taking differently: Take 17 g by mouth 2 (two) times daily. ) .  prednisoLONE acetate (PRED FORTE) 1 % ophthalmic suspension, Place into the left eye. .  Probiotic Product (ALIGN PO), Take 7.5 g by mouth daily.   Reviewed prior external information including notes and imaging from  primary care provider As well as notes that were available from care everywhere and other healthcare systems.  Past medical history, social, surgical and family history all reviewed in electronic medical record.  No pertanent information unless stated regarding to the chief complaint.   Review of Systems:  No headache, visual changes, nausea, vomiting, diarrhea, constipation, dizziness,  abdominal pain, skin  rash, fevers, chills, night sweats, weight loss, swollen lymph nodes, body aches, joint swelling, chest pain, shortness of breath, mood changes. POSITIVE muscle aches  Objective  Blood pressure 120/74, pulse 70, height 5' (1.524 m), weight 226 lb (102.5 kg), SpO2 96 %.   General: No apparent distress alert and oriented x3 mood and affect normal, dressed appropriately.  HEENT: Pupils equal, extraocular movements intact  Respiratory: Patient's speak in full sentences and does not appear short of breath  Cardiovascular: No lower extremity edema, non tender, no erythema  Arthritic changes of multiple joints. Patient's knee still has some instability on the left side with valgus and varus force with mild abnormal thigh to calf ratio.  Patient does minimally tender today which is an improvement. Patient is low back still has loss of lordosis.  Poor core strength.  Tightness with straight leg test.  Only has 5 degrees of extension of the back. Left hand shows the patient does have a trigger nodule at the A2 pulley of the thumb.  Severely tender to palpation.  Negative grind test.  Patient does have mild pain with Finkelstein's test.  Limited musculoskeletal ultrasound was performed and interpreted by Lyndal Pulley  Limited ultrasound of patient's left thumb shows the flexor tendon sheath having hypoechoic changes and what appears to be a nodule in it. Impression: Trigger nodule     Impression and Recommendations:     The above documentation has been reviewed and is accurate and complete Lyndal Pulley, DO

## 2020-01-24 NOTE — Assessment & Plan Note (Signed)
Chronic pain.  Patient has had this for quite some time.  With him on a blood thinner need to hold on any type of epidural again.  Possibility for patient being off of it and will 2 months and then will be able to have another epidural to help her back pain.  Continue the core strengthening, discussed weight loss, follow-up again 2 to 3 months

## 2020-01-24 NOTE — Patient Instructions (Addendum)
Good to see you Frog splint for the thumb Heat and massage a few times a day for the trigger finger See me again in 2-3 months to talk about epidural in the back

## 2020-01-24 NOTE — Assessment & Plan Note (Signed)
Patient does have a trigger thumb.  Discussed which activities to do which wants to avoid.  Increase activity slowly.  Patient will do heat and massage and brace at night.  Worsening pain consider injection

## 2020-01-25 DIAGNOSIS — G4733 Obstructive sleep apnea (adult) (pediatric): Secondary | ICD-10-CM | POA: Diagnosis not present

## 2020-02-05 DIAGNOSIS — N481 Balanitis: Secondary | ICD-10-CM | POA: Diagnosis not present

## 2020-02-05 DIAGNOSIS — Z8546 Personal history of malignant neoplasm of prostate: Secondary | ICD-10-CM | POA: Diagnosis not present

## 2020-02-05 DIAGNOSIS — R300 Dysuria: Secondary | ICD-10-CM | POA: Diagnosis not present

## 2020-02-06 DIAGNOSIS — J3081 Allergic rhinitis due to animal (cat) (dog) hair and dander: Secondary | ICD-10-CM | POA: Diagnosis not present

## 2020-02-06 DIAGNOSIS — G4733 Obstructive sleep apnea (adult) (pediatric): Secondary | ICD-10-CM | POA: Diagnosis not present

## 2020-02-06 DIAGNOSIS — J301 Allergic rhinitis due to pollen: Secondary | ICD-10-CM | POA: Diagnosis not present

## 2020-02-06 DIAGNOSIS — J3089 Other allergic rhinitis: Secondary | ICD-10-CM | POA: Diagnosis not present

## 2020-02-09 ENCOUNTER — Telehealth (INDEPENDENT_AMBULATORY_CARE_PROVIDER_SITE_OTHER): Payer: PPO | Admitting: Internal Medicine

## 2020-02-09 ENCOUNTER — Encounter: Payer: Self-pay | Admitting: Internal Medicine

## 2020-02-09 VITALS — BP 139/77 | HR 82 | Temp 97.5°F | Ht 66.75 in | Wt 212.0 lb

## 2020-02-09 DIAGNOSIS — Z9989 Dependence on other enabling machines and devices: Secondary | ICD-10-CM | POA: Diagnosis not present

## 2020-02-09 DIAGNOSIS — M791 Myalgia, unspecified site: Secondary | ICD-10-CM | POA: Diagnosis not present

## 2020-02-09 DIAGNOSIS — T466X5A Adverse effect of antihyperlipidemic and antiarteriosclerotic drugs, initial encounter: Secondary | ICD-10-CM | POA: Diagnosis not present

## 2020-02-09 DIAGNOSIS — I2510 Atherosclerotic heart disease of native coronary artery without angina pectoris: Secondary | ICD-10-CM | POA: Diagnosis not present

## 2020-02-09 DIAGNOSIS — E785 Hyperlipidemia, unspecified: Secondary | ICD-10-CM | POA: Diagnosis not present

## 2020-02-09 DIAGNOSIS — G4733 Obstructive sleep apnea (adult) (pediatric): Secondary | ICD-10-CM

## 2020-02-09 DIAGNOSIS — I10 Essential (primary) hypertension: Secondary | ICD-10-CM

## 2020-02-09 MED ORDER — EZETIMIBE 10 MG PO TABS: 10.0000 mg | ORAL_TABLET | Freq: Every day | ORAL | 3 refills | Status: DC

## 2020-02-09 NOTE — Patient Instructions (Signed)
Medication Instructions:  START zetia 10mg  daily CONTINUE all other current medications  *If you need a refill on your cardiac medications before your next appointment, please call your pharmacy*   Lab Work: FASTING lipid panel in 3 months to check cholesterol   If you have labs (blood work) drawn today and your tests are completely normal, you will receive your results only by: [name] MyChart Message (if you have MyChart) OR . A paper copy in the mail If you have any lab test that is abnormal or we need to change your treatment, we will call you to review the results.   Testing/Procedures: NONE   Follow-Up: At [facility], you and your health needs are our priority.  As part of our continuing mission to provide you with exceptional heart care, we have created designated Provider Care Teams.  These Care Teams include your primary Cardiologist (physician) and Advanced Practice Providers (APPs -  Physician Assistants and Nurse Practitioners) who all work together to provide you with the care you need, when you need it.  We recommend signing up for the patient portal called "MyChart".  Sign up information is provided on this After Visit Summary.  MyChart is used to connect with patients for Virtual Visits (Telemedicine).  Patients are able to view lab/test results, encounter notes, upcoming appointments, etc.  Non-urgent messages can be sent to your provider as well.   To learn more about what you can do with MyChart, go to [email or web address].    Your next appointment:   3 month(s)  The format for your next appointment:   In Person  Provider:   Raliegh Ip Mali Hilty, MD - lipid clinic   Other Instructions

## 2020-02-09 NOTE — Progress Notes (Signed)
Virtual Visit via Video Note   This visit type was conducted due to national recommendations for restrictions regarding the COVID-19 Pandemic (e.g. social distancing) in an effort to limit this patient's exposure and mitigate transmission in our community.  Due to his co-morbid illnesses, this patient is at least at moderate risk for complications without adequate follow up.  This format is felt to be most appropriate for this patient at this time.  All issues noted in this document were discussed and addressed.  A limited physical exam was performed with this format.  Please refer to the patient's chart for his consent to telehealth for [facility].    Date:  02/09/2020   ID:  Joshua [name], DOB [birth year], MRN [id number] The patient was identified using 2 identifiers.  Evaluation Performed:  New Patient Evaluation  Patient Location:  [place] [address]  Provider location:   [address] [place], [place] [address]  PCP:  [name], MD  Cardiologist:  [name], MD Electrophysiologist:  None   Chief Complaint:  Manage dyslipidemia  History of Present Illness:    Joshua [name] is a 79 y.o. male who presents via audio/video conferencing for a telehealth visit today.  This is a pleasant 79 year old male with a history of 5 vessel bypass more than 20 years ago and recent symptoms of unstable angina with PCI to the origin to the proximal graft lesion of the circumflex in January 2021.  He also has a history of dyslipidemia, hypertension, obstructive sleep apnea, prostate cancer and bladder cancer in the past.  He has been intolerant to numerous statins and had been maintained on Repatha with significant reduction in his lipids with LDL particularly into the 80s in the past however noted over the 2 years that he was taking the medication that he had side effects which initially lasted for 24 to 48 hours then up to 72 to 96 hours  after each injection.  He said it was getting to the point where he felt it was intolerable and then was advised to discontinue the medicine.  Currently on simvastatin 20 mg daily which she seems to be able to tolerate, however her lipid profile 2 months ago showed total cholesterol 215, triglycerides 240, HDL 38 and LDL 134.  He remains still well above target LDL less than 70.  This visit today discussed options to see if we can get him to target.  The patient does not have symptoms concerning for COVID-19 infection (fever, chills, cough, or new SHORTNESS OF BREATH).    Prior CV studies:   The following studies were reviewed today:  Chart reviewed  PMHx:  Past Medical History:  Diagnosis Date  . Acute medial meniscal tear   . Anemia   . Arthritis    "middle finger right hand; right knee; neck" (02/06/2014)  . Asthma   . Bladder cancer ([place]) 04/2010   "cauterized during prostate OR"  . CAD (coronary artery disease)    CABG 2001  . Depression   . Diverticulosis   . DJD (degenerative joint disease)    BACK  . GERD (gastroesophageal reflux disease)   . H/O inguinal hernia repair 12/2018  . History of gout   . Hyperlipidemia   . Hypertension   . IBS (irritable bowel syndrome)   . Obesity   . OSA (obstructive sleep apnea)    USES CPAP   . Pancreatitis ~ 1980  . Prostate cancer ([place]) 04/2010  Past Surgical History:  Procedure Laterality Date  . ANTERIOR CERVICAL DECOMP/DISCECTOMY FUSION  05/26/2006   PLATE PLACED  . CARDIAC CATHETERIZATION  11/20/1999  . CORONARY ARTERY BYPASS GRAFT  11/20/1999   SVG-RI1-RI2, SVG-OM, SVG-dRCA  . CORONARY STENT INTERVENTION N/A 04/13/2019   Procedure: CORONARY STENT INTERVENTION;  Surgeon: Burnell Blanks, MD;  Location: [facility];  Service: Cardiovascular;  Laterality: N/A;  . INGUINAL HERNIA REPAIR Left 01/02/2019   Procedure: OPEN LEFT INGUINAL HERNIA REPAIR WITH MESH;  Surgeon: Johnathan Hausen, MD;  Location: [facility];   Service: General;  Laterality: Left;  . KNEE ARTHROSCOPY Right 1992; 04/2008  . LAPAROSCOPIC CHOLECYSTECTOMY  2007  . LEFT HEART CATH AND CORS/GRAFTS ANGIOGRAPHY N/A 04/13/2019   Procedure: LEFT HEART CATH AND CORS/GRAFTS ANGIOGRAPHY;  Surgeon: Burnell Blanks, MD;  Location: [facility];  Service: Cardiovascular;  Laterality: N/A;  . NASAL SEPTUM SURGERY Right 2007  . ROBOT ASSISTED LAPAROSCOPIC RADICAL PROSTATECTOMY  04/2010  . THYROIDECTOMY, PARTIAL  1980    FAMHx:  Family History  Problem Relation Age of Onset  . Heart disease Mother   . Hyperlipidemia Mother   . Heart disease Father   . Hyperlipidemia Father   . Diabetes Brother     SOCHx:   reports that he quit smoking about 27 years ago. His smoking use included cigarettes. He has a 35.00 pack-year smoking history. He has never used smokeless tobacco. He reports current alcohol use of about 7.0 standard drinks of alcohol per week. He reports that he does not use drugs.  ALLERGIES:  Allergies  Allergen Reactions  . 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 Bromfed Nausea And Vomiting  . Clarithromycin Other (See Comments)     gastritis  . Codeine Other (See Comments)    Trouble breathing  . Doxycycline Hyclate Nausea And Vomiting  . Modafinil Other (See Comments)     anxiety-nervousness  . Oxycodone-Acetaminophen Itching  . Promethazine Hcl Other (See Comments)     fainting  . Quinolones Nausea Only    Cipro "felt real bad"  . Telithromycin Nausea And Vomiting  . Hydrocodone-Acetaminophen Rash  . Statins Other (See Comments)    Leg cramps and makes patient feel bad. Per pt tried multiple in years past  . Sulfonamide Derivatives Rash    MEDS:  Current Meds  Medication Sig  . acetaminophen (TYLENOL) 500 MG tablet Take 1,000 mg by mouth as needed for moderate pain or headache.   . albuterol (PROAIR HFA) 108 (90 BASE) MCG/ACT inhaler Inhale  1-2 puffs into the lungs every 6 (six) hours as needed for wheezing or shortness of breath. (Patient taking differently: Inhale 1-2 puffs into the lungs as needed for wheezing or shortness of breath. )  . amLODipine (NORVASC) 5 MG tablet TAKE 1 TABLET BY MOUTH EVERY DAY  . aspirin EC 81 MG tablet Take 81 mg by mouth at bedtime.   . Cholecalciferol (VITAMIN D3) 50 MCG (2000 UT) TABS Take 2,000 Units by mouth daily.  . clopidogrel (PLAVIX) 75 MG tablet Take 1 tablet (75 mg total) by mouth daily with breakfast.  . diazepam (VALIUM) 5 MG tablet TAKE 1 TABLET (5 MG TOTAL) BY MOUTH AT BEDTIME AS NEEDED.  [name] EPINEPHrine 0.3 mg/0.3 mL IJ SOAJ injection See admin instructions. for allergic reaction  . FLOVENT HFA 110 MCG/ACT inhaler Inhale 1 puff into the lungs 2 (two) times daily.   [name]  fluticasone (FLONASE) 50 MCG/ACT nasal spray Place 1 spray into both nostrils 2 (two) times daily.   . folic acid (FOLVITE) 409 MCG tablet Take 800 mcg by mouth daily.   [name] gabapentin (NEURONTIN) 300 MG capsule Take 1 cap in AM, 1 cap at noon, 2 caps at bedtime  . hydrochlorothiazide (HYDRODIURIL) 25 MG tablet TAKE 1 TABLET BY MOUTH EVERY DAY  . ketoconazole (NIZORAL) 2 % cream Apply 1 application topically 2 (two) times daily as needed for irritation.   [name] loratadine (CLARITIN) 10 MG tablet Take 10 mg by mouth at bedtime.   [name] losartan (COZAAR) 100 MG tablet TAKE 1 TABLET BY MOUTH EVERY DAY  . Miconazole Nitrate (LOTRIMIN AF DEODORANT POWDER) 2 % AERP Apply 1 spray topically 2 (two) times daily.  . nitroGLYCERIN (NITROSTAT) 0.4 MG SL tablet Place 1 tablet (0.4 mg total) under the tongue every 5 (five) minutes as needed.  . pantoprazole (PROTONIX) 40 MG tablet TAKE 1 TABLET BY MOUTH EVERY DAY  . polyethylene glycol powder (GLYCOLAX/MIRALAX) powder Take 17 g by mouth daily. (Patient taking differently: Take 17 g by mouth 2 (two) times daily. )  . simvastatin (ZOCOR) 20 MG tablet Take 20 mg by mouth daily.  . traMADol (ULTRAM) 50  MG tablet Take 1 tablet (50 mg total) by mouth every 6 (six) hours as needed.  . vitamin B-12 (CYANOCOBALAMIN) 1000 MCG tablet Take 1,000 mcg by mouth daily.     ROS: Pertinent items noted in HPI and remainder of comprehensive ROS otherwise negative.  Labs/Other Tests and Data Reviewed:    Recent Labs: 08/25/2019: ALT 17; BUN 18; Creatinine, Ser 1.22; Hemoglobin 13.8; Platelets 234.0; Potassium 3.9; Sodium 134; TSH 0.99   Recent Lipid Panel Lab Results  Component Value Date/Time   CHOL 215 (H) 12/08/2019 09:13 AM   TRIG 240 (H) 12/08/2019 09:13 AM   HDL 38 (L) 12/08/2019 09:13 AM   CHOLHDL 5.7 (H) 12/08/2019 09:13 AM   CHOLHDL 2 11/03/2018 08:06 AM   LDLCALC 134 (H) 12/08/2019 09:13 AM   LDLCALC 16 10/01/2017 05:03 PM   LDLDIRECT 115.0 06/04/2016 09:19 AM    Wt Readings from Last 3 Encounters:  02/09/20 212 lb (96.2 kg)  01/24/20 226 lb (102.5 kg)  12/25/19 213 lb (96.6 kg)     Exam:    Vital Signs:  BP 139/77   Pulse 82   Temp (!) 97.5 F (36.4 C)   Ht 5' 6.75" (1.695 m)   Wt 212 lb (96.2 kg)   SpO2 97%   BMI 33.45 kg/m    General appearance: alert and no distress Lungs: no audible wheezing Abdomen: mildly obese Extremities: extremities normal, atraumatic, no cyanosis or edema Skin: Skin color, texture, turgor normal. No rashes or lesions Neurologic: Grossly normal Psych: Pleasant  ASSESSMENT & PLAN:    1. Mixed dyslipidemia, goal LDL less than 70 2. Coronary artery disease status post 5 vessel CABG (2000) 3. Recent unstable angina and PCI January 2021 4. Hypertension 5. Obstructive sleep apnea  Mr. Perry has a persistent mixed dyslipidemia with LDL that remains well above target.  It appears that he is currently on 20 mg of simvastatin.  He says he is never been on ezetimibe in the past and I would recommend starting this 10 mg daily.  We could also consider adding Nexletol at some point in the near future.  Unfortunately could not tolerate the PCSK9  inhibitor and I suspect would have similar symptoms on Praluent that he had  with Repatha.  Regarding clinical trials, he may likely qualify for the Orion-4 trial, however I suspect that inclisiran will be commercially available early next year and that could certainly be a viable option for him in the near future rather than randomizing him to a 5-year outcome trial.  With his recent intervention, I think it is imperative for [place] to demonstrate lipid-lowering and maintain that sooner than later.  He is agreeable to this approach.  Plan follow-up with me in 3 to 4 months with repeat lipids.  Thanks again for the kind referral.  COVID-19 Education: The signs and symptoms of COVID-19 were discussed with the patient and how to seek care for testing (follow up with PCP or arrange E-visit).  The importance of social distancing was discussed today.  Patient Risk:   After full review of this patients clinical status, I feel that they are at least moderate risk at this time.  Time:   Today, I have spent 25 minutes with the patient with telehealth technology discussing dyslipidemia, PCSK9i, clinical trials.     Medication Adjustments/Labs and Tests Ordered: Current medicines are reviewed at length with the patient today.  Concerns regarding medicines are outlined above.   Tests Ordered: No orders of the defined types were placed in this encounter.   Medication Changes: No orders of the defined types were placed in this encounter.   Disposition:  in 3 month(s)  Pixie Casino, MD, [facility], [place] Director of the [facility]id [facility]  Card[facility] Diplomate of the American Board of Clinical Lipidology Attending Cardiologist  Direct Dial: [phone]  Fax: [phone]  Website:  [email or web address]  Pixie Casino, MD  02/09/2020 9:05 AM

## 2020-02-22 NOTE — Patient Instructions (Addendum)
Please stop by lab before you go If you have mychart- we will send your results within 3 business days of [place] receiving them.  If you do not have mychart- we will call you about results within 5 business days of [place] receiving them.  *please note we are currently using [facility] which has a longer processing time than [facility] typically so labs may not come back as quickly as in the past *please also note that you will see labs on mychart as soon as they post. I will later go in and write notes on them- will say "notes from Dr. Yong Channel"  Glad you are doing reasonably well

## 2020-02-22 NOTE — Progress Notes (Signed)
Phone: [phone]   Subjective:  Patient presents today for their annual physical. Chief complaint-noted.   See problem oriented charting- ROS- full  review of systems was completed and negative  except for:  Hearing loss, rectal pain, cold intolerance, painful urination, penile pain per baseline, joint and back pain, seasonal allergies, food allergies, weaness/fatigue in PM, some arthritis pain in hands  The following were reviewed and entered/updated in epic: Past Medical History:  Diagnosis Date  . Acute medial meniscal tear   . Anemia   . Arthritis    "middle finger right hand; right knee; neck" (02/06/2014)  . Asthma   . Bladder cancer ([place]) 04/2010   "cauterized during prostate OR"  . CAD (coronary artery disease)    CABG 2001  . Depression   . Diverticulosis   . DJD (degenerative joint disease)    BACK  . GERD (gastroesophageal reflux disease)   . H/O inguinal hernia repair 12/2018  . History of gout   . Hyperlipidemia   . Hypertension   . IBS (irritable bowel syndrome)   . Obesity   . OSA (obstructive sleep apnea)    USES CPAP   . Pancreatitis ~ 1980  . Prostate cancer ([place]) 04/2010   Patient Active Problem List   Diagnosis Date Noted  . Coronary atherosclerosis 10/12/2006    Priority: High  . Irritable bowel syndrome 10/12/2006    Priority: High  . Overactive bladder 11/24/2019    Priority: Medium  . Hyperglycemia 10/07/2017    Priority: Medium  . Gout 11/20/2016    Priority: Medium  . CKD (chronic kidney disease), stage III ([place]) 10/23/2013    Priority: Medium  . Neuropathy ([place]) 08/21/2013    Priority: Medium  . History of bladder cancer 04/01/2010    Priority: Medium  . Essential hypertension 02/21/2007    Priority: Medium  . Hyperlipidemia 10/12/2006    Priority: Medium  . Depression, major, single episode, in partial remission ([place]) 10/12/2006    Priority: Medium  . Asthma 10/12/2006    Priority: Medium  . Aortic atherosclerosis ([place])  05/05/2018    Priority: Low  . DJD (degenerative joint disease)     Priority: Low  . Arthritis     Priority: Low  . Neck pain 02/08/2017    Priority: Low  . Venous insufficiency 12/29/2016    Priority: Low  . Ganglion cyst of tendon sheath of right hand 12/29/2016    Priority: Low  . Avulsion fracture of metatarsal bone of right foot 11/25/2016    Priority: Low  . Obesity, Class I, BMI 30-34.9 10/03/2015    Priority: Low  . Allergic rhinitis 05/31/2014    Priority: Low  . Fatigue 11/09/2013    Priority: Low  . Dizziness 08/23/2013    Priority: Low  . RLS (restless legs syndrome) 01/03/2013    Priority: Low  . Chronic bilateral low back pain with left-sided sciatica 10/09/2011    Priority: Low  . OSTEOARTHRITIS, LOWER LEG, LEFT 10/04/2009    Priority: Low  . ANEMIA, IRON DEFICIENCY 11/06/2008    Priority: Low  . 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 OSA (obstructive sleep apnea) 11/02/2006    Priority: Low  . GERD 10/12/2006    Priority: Low  . Trigger thumb, left thumb 01/24/2020  . Left lateral epicondylitis 09/05/2019  . Degenerative arthritis of left knee 08/08/2019  . Educated about COVID-19 virus infection 04/20/2019  . Unstable angina ([place])   . Greater trochanteric bursitis  of left hip 10/27/2018  . Degenerative disc disease, lumbar 10/27/2018  . Abdominal muscle strain, initial encounter 06/16/2018  . Weakness of left leg 06/21/2017  . Hereditary and idiopathic peripheral neuropathy 09/25/2014   Past Surgical History:  Procedure Laterality Date  . ANTERIOR CERVICAL DECOMP/DISCECTOMY FUSION  05/26/2006   PLATE PLACED  . CARDIAC CATHETERIZATION  11/20/1999  . CORONARY ARTERY BYPASS GRAFT  11/20/1999   SVG-RI1-RI2, SVG-OM, SVG-dRCA  . CORONARY STENT INTERVENTION N/A 04/13/2019   Procedure: CORONARY STENT INTERVENTION;  Surgeon: Burnell Blanks, MD;  Location: [facility];  Service: Cardiovascular;  Laterality: N/A;  .  INGUINAL HERNIA REPAIR Left 01/02/2019   Procedure: OPEN LEFT INGUINAL HERNIA REPAIR WITH MESH;  Surgeon: Johnathan Hausen, MD;  Location: [facility];  Service: General;  Laterality: Left;  . KNEE ARTHROSCOPY Right 1992; 04/2008  . LAPAROSCOPIC CHOLECYSTECTOMY  2007  . LEFT HEART CATH AND CORS/GRAFTS ANGIOGRAPHY N/A 04/13/2019   Procedure: LEFT HEART CATH AND CORS/GRAFTS ANGIOGRAPHY;  Surgeon: Burnell Blanks, MD;  Location: [facility];  Service: Cardiovascular;  Laterality: N/A;  . NASAL SEPTUM SURGERY Right 2007  . ROBOT ASSISTED LAPAROSCOPIC RADICAL PROSTATECTOMY  04/2010  . THYROIDECTOMY, PARTIAL  1980    Family History  Problem Relation Age of Onset  . Heart disease Mother   . Hyperlipidemia Mother   . Heart disease Father   . Hyperlipidemia Father   . Diabetes Brother     Medications- reviewed and updated Current Outpatient Medications  Medication Sig Dispense Refill  . acetaminophen (TYLENOL) 500 MG tablet Take 1,000 mg by mouth as needed for moderate pain or headache.     . albuterol (PROAIR HFA) 108 (90 BASE) MCG/ACT inhaler Inhale 1-2 puffs into the lungs every 6 (six) hours as needed for wheezing or shortness of breath. (Patient taking differently: Inhale 1-2 puffs into the lungs as needed for wheezing or shortness of breath. ) 18 g 3  . amLODipine (NORVASC) 5 MG tablet TAKE 1 TABLET BY MOUTH EVERY DAY 90 tablet 3  . aspirin EC 81 MG tablet Take 81 mg by mouth at bedtime.     . Cholecalciferol (VITAMIN D3) 50 MCG (2000 UT) TABS Take 2,000 Units by mouth daily.    . clopidogrel (PLAVIX) 75 MG tablet Take 1 tablet (75 mg total) by mouth daily with breakfast. 90 tablet 3  . diazepam (VALIUM) 5 MG tablet TAKE 1 TABLET (5 MG TOTAL) BY MOUTH AT BEDTIME AS NEEDED. 30 tablet 1  . EPINEPHrine 0.3 mg/0.3 mL IJ SOAJ injection See admin instructions. for allergic reaction    . ezetimibe (ZETIA) 10 MG tablet Take 1 tablet (10 mg total) by mouth daily. 90 tablet 3  . FLOVENT HFA  110 MCG/ACT inhaler Inhale 1 puff into the lungs 2 (two) times daily.   5  . fluticasone (FLONASE) 50 MCG/ACT nasal spray Place 1 spray into both nostrils 2 (two) times daily.     . folic acid (FOLVITE) 604 MCG tablet Take 800 mcg by mouth daily.     [name] gabapentin (NEURONTIN) 300 MG capsule Take 1 cap in AM, 1 cap at noon, 2 caps at bedtime 360 capsule 3  . hydrochlorothiazide (HYDRODIURIL) 25 MG tablet TAKE 1 TABLET BY MOUTH EVERY DAY 90 tablet 2  . ketoconazole (NIZORAL) 2 % cream Apply 1 application topically 2 (two) times daily as needed for irritation.     [name] loratadine (CLARITIN) 10 MG tablet Take 10 mg by mouth at bedtime.     [name]  losartan (COZAAR) 100 MG tablet TAKE 1 TABLET BY MOUTH EVERY DAY 90 tablet 3  . Miconazole Nitrate (LOTRIMIN AF DEODORANT POWDER) 2 % AERP Apply 1 spray topically 2 (two) times daily.    . nitroGLYCERIN (NITROSTAT) 0.4 MG SL tablet Place 1 tablet (0.4 mg total) under the tongue every 5 (five) minutes as needed. 25 tablet 3  . pantoprazole (PROTONIX) 40 MG tablet TAKE 1 TABLET BY MOUTH EVERY DAY 90 tablet 1  . polyethylene glycol powder (GLYCOLAX/MIRALAX) powder Take 17 g by mouth daily. (Patient taking differently: Take 17 g by mouth 2 (two) times daily. ) 255 g 0  . simvastatin (ZOCOR) 20 MG tablet Take 20 mg by mouth daily.    . traMADol (ULTRAM) 50 MG tablet Take 1 tablet (50 mg total) by mouth every 6 (six) hours as needed. 20 tablet 1  . vitamin B-12 (CYANOCOBALAMIN) 1000 MCG tablet Take 1,000 mcg by mouth daily.     No current facility-administered medications for this visit.    Allergies-reviewed and updated Allergies  Allergen Reactions  . 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 Bromfed Nausea And Vomiting  . Clarithromycin Other (See Comments)     gastritis  . Codeine Other (See Comments)    Trouble breathing  . Doxycycline Hyclate Nausea And Vomiting  . Modafinil Other (See Comments)      anxiety-nervousness  . Oxycodone-Acetaminophen Itching  . Promethazine Hcl Other (See Comments)     fainting  . Quinolones Nausea Only    Cipro "felt real bad"  . Telithromycin Nausea And Vomiting  . Hydrocodone-Acetaminophen Rash  . Statins Other (See Comments)    Leg cramps and makes patient feel bad. Per pt tried multiple in years past  . Sulfonamide Derivatives Rash    Social History   Social History Narrative   Married 42 years in 2015. No kids (mumps at age 48)      Retired from [employer] in Engineer, technical sales   Volunteers at hospital      Hobbies: poker, movies and tv, staying active      Right handed   2 level home with steps he uses   Objective  Objective:  BP 126/70   Pulse 63   Temp (!) 97.2 F (36.2 C) (Temporal)   Ht 5\' 7"  (1.702 m)   Wt 213 lb 3.2 oz (96.7 kg)   SpO2 94%   BMI 33.39 kg/m  Gen: NAD, resting comfortably HEENT: Mucous membranes are moist. Oropharynx normal Neck: no thyromegaly CV: RRR no murmurs rubs or gallops Lungs: CTAB no crackles, wheeze, rhonchi Abdomen: soft/nontender/nondistended/normal bowel sounds. No rebound or guarding. Diastasis recti Ext: trace edema Skin: warm, dry Neuro: grossly normal, moves all extremities, PERRLA    Assessment and Plan  79 y.o. male presenting for annual physical.  Health Maintenance counseling: 1. Anticipatory guidance: Patient counseled regarding regular dental exams -q6 months, eye exams -yearly,  avoiding smoking and second hand smoke , limiting alcohol to 2 beverages per day.   2. Risk factor reduction:  Advised patient of need for regular exercise and diet rich and fruits and vegetables to reduce risk of heart attack and stroke. Exercise- doing elliptical even up to 45 mins last Wednesday and some weights- trying to do 3-4 x a week. Diet- limited by what he can eat- he has tolerated salads more lately which has been barrier in past (veggies very poor taste to him) - sounds  reasonable.  Wt Readings from Last 3  Encounters:  02/23/20 213 lb 3.2 oz (96.7 kg)  02/09/20 212 lb (96.2 kg)  01/24/20 226 lb (102.5 kg)  3. Immunizations/screenings/ancillary studies- fully up to date Immunization History  Administered Date(s) Administered  . Fluad Quad(high Dose 65+) 11/21/2018  . Influenza Split 01/08/2012, 11/12/2014  . Influenza Whole 12/08/2007, 12/26/2008, 12/22/2010  . Influenza, High Dose Seasonal PF 11/16/2015, 01/01/2017, 11/24/2019  . Influenza,inj,Quad PF,6+ Mos 11/30/2012  . Influenza-Unspecified 01/12/2014, 12/21/2017  . PFIZER SARS-COV-2 Vaccination 04/28/2019, 05/23/2019, 12/21/2019  . Pneumococcal Conjugate-13 05/31/2014  . Pneumococcal Polysaccharide-23 06/04/2006, 07/15/2012  . Td 04/23/2005  . Tdap 02/18/2016  . Zoster 01/08/2012  . Zoster Recombinat (Shingrix) 01/31/2018, 04/02/2018  4. Prostate cancer screening-  History of bladder and prostate cancer following with urology.  They updated bloodwork recently. Apparently azo and steroid cream helped pain he was having Lab Results  Component Value Date   PSA <0.1 10/01/2017   PSA 0.00 (L) 04/06/2012   PSA 1.35 02/18/2010   5. Colon cancer screening -  Cologuard negative 02/21/2019- no further repeat 6. Skin cancer screening- Dr. Ronnald Ramp saw in September. advised regular sunscreen use- limits sun exposure instead. Denies worrisome, changing, or new skin lesions.  7. former smoker- quit in 1990s. No regular screening needed 8. STD screening -  Only active with wife and that is limited  Status of chronic or acute concerns   # social update- last a close friend in his 64s recently and thinks he may have had covid- would not get tested.   #CAD/hyperlipidemia-history of CABG x5- follows with Dr. Percival Spanish S: Fatigue on Repatha in 2021-restarted on simvastatin 20 mg with plan for Zetia 10 mg p.o. as well recently-too soon for lipid panel to be helpful (he tells me today has not been able to tolerate this- was feeling fatigue/off on med and  improved when he stopped medicine).  He is compliant with aspirin and Plavix as well.   -Stent drug eluting placed 04/13/19 Dr. Angelena Form. Interestingly after this he has become very sleepy in evening time. Still using CPAP - sleeping with this at least 5 hours- if uses longer it makes him more tire.d  - no CP or SOB Lab Results  Component Value Date   CHOL 215 (H) 12/08/2019   HDL 38 (L) 12/08/2019   LDLCALC 134 (H) 12/08/2019   LDLDIRECT 115.0 06/04/2016   TRIG 240 (H) 12/08/2019   CHOLHDL 5.7 (H) 12/08/2019   A/P: had to come off repatha and not tolerating zetia- we will check full lipid panel as result. Plan right now is to wait 3 months and then retrial zetia. Dr. Debara Pickett looking at other options to add on as well to get him to goal    #Hypertension/CKD stage III S: Compliant with amlodipine 5 mg, hydrochlorothiazide 25 mg and losartan 100 mg -GFR has been stable.  On ARB in case proteinuric element A/P: blood pressure controlled. CKD has been stable. 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 Depression screen [facility] 2/9 02/23/2020 12/25/2019 09/04/2019  Decreased Interest 0 0 0  Down, Depressed, Hopeless 1 0 3  PHQ - 2 Score 1 0 3  Altered sleeping 3 0 1  Tired, decreased energy 3 0 3  Change in appetite 0 0 0  Feeling bad or failure about yourself  1 1 0  Trouble concentrating 0 0 0  Moving slowly or fidgety/restless 0 0 0  Suicidal thoughts 0 0 0  PHQ-9 Score 8 1 7   Difficult doing work/chores Not difficult at all Not difficult at all Not difficult at all  Some recent data might be hidden  A/P:   Partial remission based on phq9 but given lack of improvement on meds wants to remain off. Could  consider repeat therapy/counseling if symptoms worsen  % Neuropathy-follows with [name] S: Compliant with gabapentin.  Burning/needle pain in the feet- was severe yesterday.  -has not tried support group yet.  Tramadol before bed was helpful.  A/P: stable- I wish there was more we could do for him as he has severe flares at times- dont feel strongly about higher dose gabapentin with CKD  -I dont mind refilling tramadol if needed  #Hyperglycemia history of mildly elevated A1c.  Prediabetes range.  Improved on last check  Lab Results  Component Value Date   HGBA1C 5.6 06/06/2019   HGBA1C 5.8 11/03/2018   HGBA1C 5.7 05/05/2018    #Senile purpura-easy bruising and bleeding on aspirin and plavix.  Worse on the bruising. Monitor and check cbc  #Aortic atherosclerosis-Noted on CT 2016. Continue risk factor modification as per hyperlipidemia section  #b12 deficiency-on oral B12.  Change to every other day August 26, 2019   Lab Results  Component Value Date   VITAMINB12 >1526 (H) 08/25/2019    #Incontinence/overactive bladder- in patient with history of prostate/bladder cancer fully treated S:Issues date back to prostatectomy and bladder cancer cautery in 2012.  Worsening symptoms 2021 and wanted to try medication-recommended against oxybutynin due to beers list and potential increased fatigue-opted to try Myrbetriq-very expensive and minimally effective so referred to Dr. [name] with urology A/P: pending visit dec 7 with Dr. [name]- we will await this evaluation   Recommended follow up: Return in about 4 months (around 06/23/2020) for follow up- or sooner if needed. Future Appointments  Date Time Provider [place]  04/08/2020  9:20 AM [name], MD [facility]  04/09/2020  9:00 AM Lyndal Pulley, DO [facility] None  05/01/2020 10:00 AM Cameron Sprang, MD [facility] None   Lab/Order associations: fasting   ICD-10-CM   1. Preventative health care  Z00.00 CBC With  Differential/Platelet    COMPLETE METABOLIC PANEL WITH GFR    VITAMIN D 25 Hydroxy (Vit-D Deficiency, Fractures)    Vitamin B12    Lipid Panel w/reflex Direct LDL  2. Hyperlipidemia, unspecified hyperlipidemia type  E78.5 CBC With Differential/Platelet    COMPLETE METABOLIC PANEL WITH GFR    Lipid Panel w/reflex Direct LDL  3. Vitamin D deficiency  E55.9 VITAMIN D 25 Hydroxy (Vit-D Deficiency, Fractures)  4. Vitamin B12 deficiency  E53.8 Vitamin B12  5. Hyperglycemia  R73.9 Hemoglobin A1c    No orders of the defined types were placed in this encounter.   Return precautions advised.  [name], MD

## 2020-02-23 ENCOUNTER — Encounter: Payer: Self-pay | Admitting: Family Medicine

## 2020-02-23 ENCOUNTER — Other Ambulatory Visit: Payer: Self-pay

## 2020-02-23 ENCOUNTER — Ambulatory Visit (INDEPENDENT_AMBULATORY_CARE_PROVIDER_SITE_OTHER): Payer: PPO | Admitting: Family Medicine

## 2020-02-23 VITALS — BP 126/70 | HR 63 | Temp 97.2°F | Ht 67.0 in | Wt 213.2 lb

## 2020-02-23 DIAGNOSIS — R739 Hyperglycemia, unspecified: Secondary | ICD-10-CM

## 2020-02-23 DIAGNOSIS — E559 Vitamin D deficiency, unspecified: Secondary | ICD-10-CM

## 2020-02-23 DIAGNOSIS — J301 Allergic rhinitis due to pollen: Secondary | ICD-10-CM | POA: Diagnosis not present

## 2020-02-23 DIAGNOSIS — Z0000 Encounter for general adult medical examination without abnormal findings: Secondary | ICD-10-CM | POA: Diagnosis not present

## 2020-02-23 DIAGNOSIS — J3089 Other allergic rhinitis: Secondary | ICD-10-CM | POA: Diagnosis not present

## 2020-02-23 DIAGNOSIS — J3081 Allergic rhinitis due to animal (cat) (dog) hair and dander: Secondary | ICD-10-CM | POA: Diagnosis not present

## 2020-02-23 DIAGNOSIS — E538 Deficiency of other specified B group vitamins: Secondary | ICD-10-CM | POA: Diagnosis not present

## 2020-02-23 DIAGNOSIS — E785 Hyperlipidemia, unspecified: Secondary | ICD-10-CM | POA: Diagnosis not present

## 2020-02-24 LAB — COMPLETE METABOLIC PANEL WITH GFR
AG Ratio: 1.7 (calc) (ref 1.0–2.5)
ALT: 18 U/L (ref 9–46)
AST: 18 U/L (ref 10–35)
Albumin: 4.3 g/dL (ref 3.6–5.1)
Alkaline phosphatase (APISO): 60 U/L (ref 35–144)
BUN/Creatinine Ratio: 13 (calc) (ref 6–22)
BUN: 17 mg/dL (ref 7–25)
CO2: 29 mmol/L (ref 20–32)
Calcium: 9.5 mg/dL (ref 8.6–10.3)
Chloride: 97 mmol/L — ABNORMAL LOW (ref 98–110)
Creat: 1.33 mg/dL — ABNORMAL HIGH (ref 0.70–1.18)
GFR, Est African American: 59 mL/min/{1.73_m2} — ABNORMAL LOW (ref >=60)
GFR, Est Non African American: 51 mL/min/{1.73_m2} — ABNORMAL LOW (ref >=60)
Globulin: 2.5 g/dL (calc) (ref 1.9–3.7)
Glucose, Bld: 99 mg/dL (ref 65–99)
Potassium: 4.3 mmol/L (ref 3.5–5.3)
Sodium: 132 mmol/L — ABNORMAL LOW (ref 135–146)
Total Bilirubin: 1.1 mg/dL (ref 0.2–1.2)
Total Protein: 6.8 g/dL (ref 6.1–8.1)

## 2020-02-24 LAB — LIPID PANEL W/REFLEX DIRECT LDL
Cholesterol: 149 mg/dL (ref <200)
HDL: 42 mg/dL (ref >=40)
LDL Cholesterol (Calc): 84 mg/dL (calc)
Non-HDL Cholesterol (Calc): 107 mg/dL (calc) (ref <130)
Total CHOL/HDL Ratio: 3.5 (calc) (ref <5.0)
Triglycerides: 130 mg/dL (ref <150)

## 2020-02-24 LAB — HEMOGLOBIN A1C
Hgb A1c MFr Bld: 5.7 % of total Hgb — ABNORMAL HIGH (ref <5.7)
Mean Plasma Glucose: 117 (calc)
eAG (mmol/L): 6.5 (calc)

## 2020-02-24 LAB — CBC WITH DIFFERENTIAL/PLATELET
Absolute Monocytes: 603 cells/uL (ref 200–950)
Basophils Absolute: 52 cells/uL (ref 0–200)
Basophils Relative: 0.9 %
Eosinophils Absolute: 273 cells/uL (ref 15–500)
Eosinophils Relative: 4.7 %
HCT: 42.5 % (ref 38.5–50.0)
Hemoglobin: 15 g/dL (ref 13.2–17.1)
Lymphs Abs: 1079 cells/uL (ref 850–3900)
MCH: 31.4 pg (ref 27.0–33.0)
MCHC: 35.3 g/dL (ref 32.0–36.0)
MCV: 88.9 fL (ref 80.0–100.0)
MPV: 9 fL (ref 7.5–12.5)
Monocytes Relative: 10.4 %
Neutro Abs: 3793 cells/uL (ref 1500–7800)
Neutrophils Relative %: 65.4 %
Platelets: 222 10*3/uL (ref 140–400)
RBC: 4.78 10*6/uL (ref 4.20–5.80)
RDW: 12.5 % (ref 11.0–15.0)
Total Lymphocyte: 18.6 %
WBC: 5.8 10*3/uL (ref 3.8–10.8)

## 2020-02-24 LAB — VITAMIN D 25 HYDROXY (VIT D DEFICIENCY, FRACTURES): Vit D, 25-Hydroxy: 62 ng/mL (ref 30–100)

## 2020-02-24 LAB — VITAMIN B12: Vitamin B-12: 876 pg/mL (ref 200–1100)

## 2020-02-27 DIAGNOSIS — N3942 Incontinence without sensory awareness: Secondary | ICD-10-CM | POA: Diagnosis not present

## 2020-02-27 DIAGNOSIS — N481 Balanitis: Secondary | ICD-10-CM | POA: Diagnosis not present

## 2020-03-06 ENCOUNTER — Telehealth: Payer: Self-pay | Admitting: Family Medicine

## 2020-03-06 NOTE — Chronic Care Management (AMB) (Signed)
  Chronic Care Management   Note  03/06/2020 Name: Joshua [name] MRN: [id number] DOB: December 19, 1940  Joshua [name] is a 79 y.o. year old male who is a primary care patient of [name], MD. I reached out to Joshua [name] by phone today in response to a referral sent by Joshua [name]'s PCP, [name], MD.   Joshua [name] was given information about Chronic Care Management services today including:  1. CCM service includes personalized support from designated clinical staff supervised by his physician, including individualized plan of care and coordination with other care providers 2. 24/7 contact phone numbers for assistance for urgent and routine care needs. 3. Service will only be billed when office clinical staff spend 20 minutes or more in a month to coordinate care. 4. Only one practitioner may furnish and bill the service in a calendar month. 5. The patient may stop CCM services at any time (effective at the end of the month) by phone call to the office staff.   Patient agreed to services and verbal consent obtained.   Follow up plan:   Lauretta Grill [employer] Scheduler

## 2020-03-08 DIAGNOSIS — J3089 Other allergic rhinitis: Secondary | ICD-10-CM | POA: Diagnosis not present

## 2020-03-08 DIAGNOSIS — J301 Allergic rhinitis due to pollen: Secondary | ICD-10-CM | POA: Diagnosis not present

## 2020-03-08 DIAGNOSIS — J3081 Allergic rhinitis due to animal (cat) (dog) hair and dander: Secondary | ICD-10-CM | POA: Diagnosis not present

## 2020-03-10 ENCOUNTER — Other Ambulatory Visit: Payer: Self-pay | Admitting: Cardiology

## 2020-03-11 ENCOUNTER — Telehealth: Payer: Self-pay | Admitting: Family Medicine

## 2020-03-11 NOTE — Telephone Encounter (Signed)
Last gel injection on 09/05/2019. Spoke with Lesly Rubenstein who will run patient for approval again for appt on 12/29. Patient notified to keep appt and that gel injection is a possibility at next visit.

## 2020-03-11 NOTE — Telephone Encounter (Signed)
Patient called stating that he is having reoccurring knee pain that it sometimes very painful. He asked if he would be able to have another gel injection?  If so, can we go ahead and work on getting this approved? He is scheduled for 12/29 with Dr Tamala Julian.

## 2020-03-14 ENCOUNTER — Other Ambulatory Visit: Payer: Self-pay | Admitting: Cardiology

## 2020-03-19 ENCOUNTER — Telehealth: Payer: Self-pay

## 2020-03-19 NOTE — Telephone Encounter (Signed)
Left message for patient to call back. Was calling to see if patient would be able to come in at 11:00am on 03/20/2020 instead of his scheduled 12:45am appt.

## 2020-03-20 ENCOUNTER — Ambulatory Visit: Payer: PPO | Admitting: Family Medicine

## 2020-03-20 ENCOUNTER — Other Ambulatory Visit: Payer: Self-pay

## 2020-03-20 ENCOUNTER — Ambulatory Visit: Payer: Self-pay

## 2020-03-20 ENCOUNTER — Ambulatory Visit (INDEPENDENT_AMBULATORY_CARE_PROVIDER_SITE_OTHER): Payer: PPO

## 2020-03-20 ENCOUNTER — Encounter: Payer: Self-pay | Admitting: Family Medicine

## 2020-03-20 VITALS — BP 118/82 | HR 80 | Ht 67.0 in | Wt 217.0 lb

## 2020-03-20 DIAGNOSIS — M25562 Pain in left knee: Secondary | ICD-10-CM

## 2020-03-20 DIAGNOSIS — M79645 Pain in left finger(s): Secondary | ICD-10-CM | POA: Diagnosis not present

## 2020-03-20 DIAGNOSIS — M1712 Unilateral primary osteoarthritis, left knee: Secondary | ICD-10-CM

## 2020-03-20 DIAGNOSIS — M65312 Trigger thumb, left thumb: Secondary | ICD-10-CM

## 2020-03-20 DIAGNOSIS — G4733 Obstructive sleep apnea (adult) (pediatric): Secondary | ICD-10-CM | POA: Diagnosis not present

## 2020-03-20 DIAGNOSIS — M25561 Pain in right knee: Secondary | ICD-10-CM

## 2020-03-20 DIAGNOSIS — G8929 Other chronic pain: Secondary | ICD-10-CM

## 2020-03-20 DIAGNOSIS — M5136 Other intervertebral disc degeneration, lumbar region: Secondary | ICD-10-CM | POA: Diagnosis not present

## 2020-03-20 DIAGNOSIS — M1711 Unilateral primary osteoarthritis, right knee: Secondary | ICD-10-CM | POA: Diagnosis not present

## 2020-03-20 NOTE — Patient Instructions (Addendum)
Monovisc injection today If it does not last long do need to consider replacement Xray today Once cardiology says ok for injection we will order one for your back and see you for your thumb Continue brace at night Happy New Year

## 2020-03-20 NOTE — Assessment & Plan Note (Signed)
Patient does have a trigger thumb as well as some CMC arthritis.  Once again patient would like to hold on the injections unti patient gets clearance from his cardiologist for steroid.  With him sure that this will be safe otherwise.  Patient will ask at follow-up next month follow-up with me again 6 to 8 weeks continue bracing for now.

## 2020-03-20 NOTE — Assessment & Plan Note (Signed)
Severe overall and does respond well to the epidurals.  Still has to hold for another month until he is seen by cardiology and will ask about the Plavix.  If they are able to potentially remove those for bridge for the injection we will order this.

## 2020-03-20 NOTE — Assessment & Plan Note (Signed)
Injection given today.  Avoided the steroid again until patient has approval from his cardiologist next month.  Discussed the potential for possible surgical intervention may be necessary if patient continues to have difficulty and not responding to the viscosupplementation.  Patient understands this.  Follow-up with me again in 2 to 3 months

## 2020-03-20 NOTE — Progress Notes (Signed)
[facility] [place] Phone: [phone] Subjective:   Joshua [name], am serving as a scribe for Dr. Hulan Saas. This visit occurred during the SARS-CoV-2 public health emergency.  Safety protocols were in place, including screening questions prior to the visit, additional usage of staff PPE, and extensive cleaning of exam room while observing appropriate contact time as indicated for disinfecting solutions.   I'm seeing this patient by the request  of:  [name], MD  CC: Back pain, thumb pain and knee pain follow-up  [id number]   01/24/2020 Degenerative arthritis of the left knee.  Stable at the moment.  Continue to monitor.  Not using a brace.  Patient does have a trigger thumb.  Discussed which activities to do which wants to avoid.  Increase activity slowly.  Patient will do heat and massage and brace at night.  Worsening pain consider injection  Chronic pain.  Patient has had this for quite some time.  With him on a blood thinner need to hold on any type of epidural again.  Possibility for patient being off of it and will 2 months and then will be able to have another epidural to help her back pain.  Continue the core strengthening, discussed weight loss, follow-up again 2 to 3 months  Update 03/20/2020 Joshua [name] is a 79 y.o. male coming in with complaint of left knee pain. Would like injection in left knee. States that squatting and moving with foot planted increases pain in joint.  Patient did have viscosupplementation in June.  Patient is approved again does feel like it was helpful for some time  Patient is having increase in back pain. Would like to discuss epidural.  Patient though is on Plavix at this moment and has been told by cardiologist [name] injections for 1 year.  Patient does have follow-up with them next month  Also having increase in left thumb pain. Pain with movement in [facility] joint. Wears  brace at night.  States it is quite severe.  Patient is concerned because he will be the primary caretaker for his wife will be having surgery next month.       Past Medical History:  Diagnosis Date  . Acute medial meniscal tear   . Anemia   . Arthritis    "middle finger right hand; right knee; neck" (02/06/2014)  . Asthma   . Bladder cancer ([place]) 04/2010   "cauterized during prostate OR"  . CAD (coronary artery disease)    CABG 2001  . Depression   . Diverticulosis   . DJD (degenerative joint disease)    BACK  . GERD (gastroesophageal reflux disease)   . H/O inguinal hernia repair 12/2018  . History of gout   . Hyperlipidemia   . Hypertension   . IBS (irritable bowel syndrome)   . Obesity   . OSA (obstructive sleep apnea)    USES CPAP   . Pancreatitis ~ 1980  . Prostate cancer ([place]) 04/2010   Past Surgical History:  Procedure Laterality Date  . ANTERIOR CERVICAL DECOMP/DISCECTOMY FUSION  05/26/2006   PLATE PLACED  . CARDIAC CATHETERIZATION  11/20/1999  . CORONARY ARTERY BYPASS GRAFT  11/20/1999   SVG-RI1-RI2, SVG-OM, SVG-dRCA  . CORONARY STENT INTERVENTION N/A 04/13/2019   Procedure: CORONARY STENT INTERVENTION;  Surgeon: Burnell Blanks, MD;  Location: [facility];  Service: Cardiovascular;  Laterality: N/A;  . INGUINAL HERNIA REPAIR Left 01/02/2019   Procedure: OPEN  LEFT INGUINAL HERNIA REPAIR WITH MESH;  Surgeon: Luretha Murphy, MD;  Location: [facility];  Service: General;  Laterality: Left;  . KNEE ARTHROSCOPY Right 1992; 04/2008  . LAPAROSCOPIC CHOLECYSTECTOMY  2007  . LEFT HEART CATH AND CORS/GRAFTS ANGIOGRAPHY N/A 04/13/2019   Procedure: LEFT HEART CATH AND CORS/GRAFTS ANGIOGRAPHY;  Surgeon: Kathleene Hazel, MD;  Location: [facility];  Service: Cardiovascular;  Laterality: N/A;  . NASAL SEPTUM SURGERY Right 2007  . ROBOT ASSISTED LAPAROSCOPIC RADICAL PROSTATECTOMY  04/2010  . THYROIDECTOMY, PARTIAL  1980   Social History    Socioeconomic History  . Marital status: Married    Spouse name: Not on file  . Number of children: Not on file  . Years of education: Not on file  . Highest education level: Not on file  Occupational History  . Occupation: retired    Associate Professor: RETIRED  Tobacco Use  . Smoking status: Former Smoker    Packs/day: 1.00    Years: 35.00    Pack years: 35.00    Types: Cigarettes    Quit date: 06/16/1992    Years since quitting: 27.7  . Smokeless tobacco: Never Used  Vaping Use  . Vaping Use: Never used  Substance and Sexual Activity  . Alcohol use: Yes    Alcohol/week: 7.0 standard drinks    Types: 7 Shots of liquor per week    Comment: mixed drink Q DAY  . Drug use: [name]  . Sexual activity: Not Currently    Partners: Female  Other Topics Concern  . Not on file  Social History Narrative   Married 42 years in 2015. 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 Difficulty of Paying Living Expenses: Not hard at all  Food Insecurity: Not on file  Transportation Needs: [name] Transportation Needs  . Lack of Transportation (Medical): [name]  . Lack of Transportation (Non-Medical): [name]  Physical Activity: Unknown  . Days of Exercise per Week: 5 days  . Minutes of Exercise per Session: Not on file  Stress: Not on file  Social Connections: Not on file   Allergies  Allergen Reactions  . 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 Bromfed Nausea And Vomiting  . Clarithromycin Other (See Comments)     gastritis  . Codeine Other (See Comments)    Trouble breathing  . Doxycycline Hyclate Nausea And Vomiting  . Modafinil Other (See Comments)     anxiety-nervousness  . Oxycodone-Acetaminophen Itching  .  Promethazine Hcl Other (See Comments)     fainting  . Quinolones Nausea Only    Cipro "felt real bad"  . Telithromycin Nausea And Vomiting  . Hydrocodone-Acetaminophen Rash  . Statins Other (See Comments)    Leg cramps and makes patient feel bad. Per pt tried multiple in years past  . Sulfonamide Derivatives Rash   Family History  Problem Relation Age of Onset  . Heart disease Mother   . Hyperlipidemia Mother   . Heart disease Father   . Hyperlipidemia Father   . Diabetes Brother      Current Outpatient Medications (Cardiovascular):  .  amLODipine (NORVASC) 5 MG tablet, TAKE 1 TABLET BY MOUTH EVERY DAY .  EPINEPHrine 0.3 mg/0.3 mL IJ SOAJ injection, See admin instructions. for allergic reaction .  ezetimibe (ZETIA) 10 MG tablet, Take 1 tablet (10 mg total) by mouth daily. .  hydrochlorothiazide (HYDRODIURIL) 25 MG tablet, TAKE 1 TABLET BY MOUTH EVERY DAY .  losartan (COZAAR) 100 MG tablet, TAKE 1 TABLET BY MOUTH EVERY DAY .  nitroGLYCERIN (NITROSTAT) 0.4 MG SL tablet, PLACE 1 TABLET UNDER THE TONGUE EVERY 5 (FIVE) MINUTES AS NEEDED. [name]  simvastatin (ZOCOR) 20 MG tablet, TAKE 1 TABLET BY MOUTH EVERYDAY AT BEDTIME  Current Outpatient Medications (Respiratory):  .  albuterol (PROAIR HFA) 108 (90 BASE) MCG/ACT inhaler, Inhale 1-2 puffs into the lungs every 6 (six) hours as needed for wheezing or shortness of breath. (Patient taking differently: Inhale 1-2 puffs into the lungs as needed for wheezing or shortness of breath.) .  FLOVENT HFA 110 MCG/ACT inhaler, Inhale 1 puff into the lungs 2 (two) times daily.  .  fluticasone (FLONASE) 50 MCG/ACT nasal spray, Place 1 spray into both nostrils 2 (two) times daily.  [name]  loratadine (CLARITIN) 10 MG tablet, Take 10 mg by mouth at bedtime.   Current Outpatient Medications (Analgesics):  .  acetaminophen (TYLENOL) 500 MG tablet, Take 1,000 mg by mouth as needed for moderate pain or headache.  [name]  aspirin EC 81 MG tablet, Take 81 mg by mouth at  bedtime.  .  traMADol (ULTRAM) 50 MG tablet, Take 1 tablet (50 mg total) by mouth every 6 (six) hours as needed.  Current Outpatient Medications (Hematological):  .  clopidogrel (PLAVIX) 75 MG tablet, Take 1 tablet (75 mg total) by mouth daily with breakfast. .  folic acid (FOLVITE) [id number] MCG tablet, Take 800 mcg by mouth daily.  .  vitamin B-12 (CYANOCOBALAMIN) 1000 MCG tablet, Take 1,000 mcg by mouth daily.  Current Outpatient Medications (Other):  [name]  Cholecalciferol (VITAMIN D3) 50 MCG (2000 UT) TABS, Take 2,000 Units by mouth daily. .  diazepam (VALIUM) 5 MG tablet, TAKE 1 TABLET (5 MG TOTAL) BY MOUTH AT BEDTIME AS NEEDED. [name]  gabapentin (NEURONTIN) 300 MG capsule, Take 1 cap in AM, 1 cap at noon, 2 caps at bedtime .  ketoconazole (NIZORAL) 2 % cream, Apply 1 application topically 2 (two) times daily as needed for irritation.  .  Miconazole Nitrate (LOTRIMIN AF DEODORANT POWDER) 2 % AERP, Apply 1 spray topically 2 (two) times daily. .  pantoprazole (PROTONIX) 40 MG tablet, TAKE 1 TABLET BY MOUTH EVERY DAY .  polyethylene glycol powder (GLYCOLAX/MIRALAX) powder, Take 17 g by mouth daily. (Patient taking differently: Take 17 g by mouth 2 (two) times daily.)   Reviewed prior external information including notes and imaging from  primary care provider As well as notes that were available from care everywhere and other healthcare systems.  Past medical history, social, surgical and family history all reviewed in electronic medical record.  [name] pertanent information unless stated regarding to the chief complaint.   Review of Systems:  [name] headache, visual changes, nausea, vomiting, diarrhea, constipation, dizziness, abdominal pain, skin rash, fevers, chills, night sweats, weight loss, swollen lymph nodes, body aches, joint swelling, chest pain, shortness of breath, mood changes. POSITIVE muscle aches  Objective  Blood pressure 118/82, pulse 80, height 5\' 7"  (1.702 m), weight 217 lb (98.4 kg),  SpO2 91 %.   General: [name] apparent distress alert and oriented x3 mood and affect normal, dressed appropriately.  HEENT: Pupils equal, extraocular movements intact  Respiratory: Patient's speak in full sentences and does not appear short of breath  Cardiovascular: Trace lower extremity edema, non tender, [name] erythema  Knee: Left valgus deformity noted.  Abnormal thigh to calf ratio.  Tender to palpation over medial and PF joint line.  ROM full in flexion and extension and lower leg rotation. instability with valgus force.  painful patellar compression. Patellar glide with moderate crepitus. Patellar and quadriceps tendons unremarkable. Hamstring and quadriceps strength is normal. Contralateral knee shows mild arthritic changes  Back exam loss of lordosis.  Tenderness to palpation of the paraspinal musculature diffusely.  Tightness with straight leg test with mild radicular symptoms down the left leg and forward flexion of 25 degrees  Left hand exam shows mild positive CMC grind test.  Mild swelling of the CMC joint tender though over the A2 [name] with a trigger nodule noted.  Limited musculoskeletal ultrasound was performed and interpreted by Joshua [name]  Limited ultrasound of patient's left hand shows the patient does have a trigger nodule with hypoechoic changes within the flexor tendon sheath of the first finger.  Mild to moderate CMC arthritic changes also noted. Impression: Trigger nodule left thumb mild to moderate arthritis  After informed written and verbal consent, patient was seated on exam table. Left knee was prepped with alcohol swab and utilizing anterolateral approach, patient's left knee space was injected with 40 mg per 3 mL of Monovisc (sodium hyaluronate) in a prefilled syringe was injected easily into the knee through a 22-gauge needle..Patient tolerated the procedure well without immediate complications.    Impression and Recommendations:     The above  documentation has been reviewed and is accurate and complete Joshua Pulley, DO

## 2020-03-21 DIAGNOSIS — J301 Allergic rhinitis due to pollen: Secondary | ICD-10-CM | POA: Diagnosis not present

## 2020-03-21 DIAGNOSIS — J3081 Allergic rhinitis due to animal (cat) (dog) hair and dander: Secondary | ICD-10-CM | POA: Diagnosis not present

## 2020-03-21 DIAGNOSIS — J3089 Other allergic rhinitis: Secondary | ICD-10-CM | POA: Diagnosis not present

## 2020-03-25 ENCOUNTER — Encounter: Payer: Self-pay | Admitting: Family Medicine

## 2020-03-28 ENCOUNTER — Other Ambulatory Visit: Payer: Self-pay | Admitting: Family Medicine

## 2020-04-03 ENCOUNTER — Telehealth: Payer: Self-pay

## 2020-04-03 NOTE — Chronic Care Management (AMB) (Signed)
Chronic Care Management Pharmacy Assistant   Name: Joshua [name]  MRN: [id number] DOB: 1940-08-14  Reason for Encounter: Medication Review/ Initial Call   [name],  80 y.o. , male presents for their Initial CCM visit with the clinical pharmacist via telephone.  PCP : [name], MD  Allergies:   Allergies  Allergen Reactions  . 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 Bromfed Nausea And Vomiting  . Clarithromycin Other (See Comments)     gastritis  . Codeine Other (See Comments)    Trouble breathing  . Doxycycline Hyclate Nausea And Vomiting  . Modafinil Other (See Comments)     anxiety-nervousness  . Oxycodone-Acetaminophen Itching  . Promethazine Hcl Other (See Comments)     fainting  . Quinolones Nausea Only    Cipro "felt real bad"  . Telithromycin Nausea And Vomiting  . Hydrocodone-Acetaminophen Rash  . Statins Other (See Comments)    Leg cramps and makes patient feel bad. Per pt tried multiple in years past  . Sulfonamide Derivatives Rash    Medications: Outpatient Encounter Medications as of 04/03/2020  Medication Sig Note  . acetaminophen (TYLENOL) 500 MG tablet Take 1,000 mg by mouth as needed for moderate pain or headache.    . albuterol (PROAIR HFA) 108 (90 BASE) MCG/ACT inhaler Inhale 1-2 puffs into the lungs every 6 (six) hours as needed for wheezing or shortness of breath. (Patient taking differently: Inhale 1-2 puffs into the lungs as needed for wheezing or shortness of breath.)   . amLODipine (NORVASC) 5 MG tablet TAKE 1 TABLET BY MOUTH EVERY DAY   . aspirin EC 81 MG tablet Take 81 mg by mouth at bedtime.    . Cholecalciferol (VITAMIN D3) 50 MCG (2000 UT) TABS Take 2,000 Units by mouth daily.   . clopidogrel (PLAVIX) 75 MG tablet Take 1 tablet (75 mg total) by mouth daily with breakfast.   . diazepam (VALIUM) 5 MG tablet TAKE 1 TABLET (5 MG TOTAL) BY MOUTH AT BEDTIME AS  NEEDED.   [name] EPINEPHrine 0.3 mg/0.3 mL IJ SOAJ injection See admin instructions. for allergic reaction   . ezetimibe (ZETIA) 10 MG tablet Take 1 tablet (10 mg total) by mouth daily.   [name] FLOVENT HFA 110 MCG/ACT inhaler Inhale 1 puff into the lungs 2 (two) times daily.    . fluticasone (FLONASE) 50 MCG/ACT nasal spray Place 1 spray into both nostrils 2 (two) times daily.    . folic acid (FOLVITE) 884 MCG tablet Take 800 mcg by mouth daily.    [name] gabapentin (NEURONTIN) 300 MG capsule Take 1 cap in AM, 1 cap at noon, 2 caps at bedtime   . hydrochlorothiazide (HYDRODIURIL) 25 MG tablet TAKE 1 TABLET BY MOUTH EVERY DAY   . ketoconazole (NIZORAL) 2 % cream Apply 1 application topically 2 (two) times daily as needed for irritation.    [name] loratadine (CLARITIN) 10 MG tablet Take 10 mg by mouth at bedtime.    [name] losartan (COZAAR) 100 MG tablet TAKE 1 TABLET BY MOUTH EVERY DAY   . Miconazole Nitrate (LOTRIMIN AF DEODORANT POWDER) 2 % AERP Apply 1 spray topically 2 (two) times daily.   . nitroGLYCERIN (NITROSTAT) 0.4 MG SL tablet PLACE 1 TABLET UNDER THE TONGUE EVERY 5 (FIVE) MINUTES AS NEEDED.   [name] pantoprazole (PROTONIX) 40 MG tablet TAKE 1 TABLET BY MOUTH EVERY DAY   . polyethylene glycol powder (  GLYCOLAX/MIRALAX) powder Take 17 g by mouth daily. (Patient taking differently: Take 17 g by mouth 2 (two) times daily.)   . simvastatin (ZOCOR) 20 MG tablet TAKE 1 TABLET BY MOUTH EVERYDAY AT BEDTIME   . traMADol (ULTRAM) 50 MG tablet Take 1 tablet (50 mg total) by mouth every 6 (six) hours as needed. 05/02/2019: Took last week, does not take often  . vitamin B-12 (CYANOCOBALAMIN) 1000 MCG tablet Take 1,000 mcg by mouth daily.    No facility-administered encounter medications on file as of 04/03/2020.    Current Diagnosis: Patient Active Problem List   Diagnosis Date Noted  . Trigger thumb, left thumb 01/24/2020  . Overactive bladder 11/24/2019  . Left lateral epicondylitis 09/05/2019  . Degenerative arthritis of  left knee 08/08/2019  . Educated about COVID-19 virus infection 04/20/2019  . Unstable angina ([place])   . Greater trochanteric bursitis of left hip 10/27/2018  . Degenerative disc disease, lumbar 10/27/2018  . Abdominal muscle strain, initial encounter 06/16/2018  . Aortic atherosclerosis ([place]) 05/05/2018  . Hyperglycemia 10/07/2017  . Weakness of left leg 06/21/2017  . DJD (degenerative joint disease)   . Arthritis   . Neck pain 02/08/2017  . Venous insufficiency 12/29/2016  . Ganglion cyst of tendon sheath of right hand 12/29/2016  . Avulsion fracture of metatarsal bone of right foot 11/25/2016  . Gout 11/20/2016  . Obesity, Class I, BMI 30-34.9 10/03/2015  . Hereditary and idiopathic peripheral neuropathy 09/25/2014  . Allergic rhinitis 05/31/2014  . Fatigue 11/09/2013  . CKD (chronic kidney disease), stage III ([place]) 10/23/2013  . Dizziness 08/23/2013  . Neuropathy ([place]) 08/21/2013  . RLS (restless legs syndrome) 01/03/2013  . Chronic bilateral low back pain with left-sided sciatica 10/09/2011  . History of bladder cancer 04/01/2010  . OSTEOARTHRITIS, LOWER LEG, LEFT 10/04/2009  . ANEMIA, IRON DEFICIENCY 11/06/2008  . 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 Essential hypertension 02/21/2007  . OSA (obstructive sleep apnea) 11/02/2006  . Hyperlipidemia 10/12/2006  . Depression, major, single episode, in partial remission ([place]) 10/12/2006  . Coronary atherosclerosis 10/12/2006  . Asthma 10/12/2006  . GERD 10/12/2006  . Irritable bowel syndrome 10/12/2006    Have you seen any other providers since your last visit?   Patient has seen Hulan Saas, DO, sports medicine since his last visit with his PCP [name], MD).  Any changes in your medications or health?   Patient states he has not had any changes to his medications or health.  Any side effects from any medications?  Patient states he experiences a lot of tiredness and sleepiness from taking medication  Gabapentin.  Do you have an symptoms or problems not managed by your medications?   Patient states he has been feeling some what depressed and upset lately. Patient states his wife is having an operation on her shoulder soon and he doesn't have that many close relatives which makes him "feel bad" at times.  Any concerns about your health right now?   Patient states he has a lot of arthritis in his knees, hands and back that affect his activities of daily living. Patient states he would also like to lose weight, cure his chronic jock itch and his chronic cough. Patient states he coughs a couple times a day and his wife seems to worry about it.  Has your provider asked that you check blood pressure, blood sugar, or follow special diet at home?  Patient states he does not check his blood pressure, blood sugar or follow  a special diet at this time.  Do you get any type of exercise on a regular basis?   Patient states he is unable to exercise at this time due arthritis in knees, hands and back.  Can you think of a goal you would like to reach for your health?   Patient states he would like to "feel better" and be able to go to the gym. Patient states he used to work out on a regular basis until about 4 years ago before he developed arthritis. Patient states his goal is to longer be in pain and lose some weight.   Do you have any problems getting your medications?  Patient states he does not have any problems getting his medications at this time.  Is there anything that you would like to discuss during the appointment?   Patient states he would like to discuss possibly removing gabapentin as it makes him tired and sleepy. Patient states he would also like to discuss something to help give some energy make him "feel better".  Please bring medications and supplements to appointment  April Calhoun, [facility] Clinical Pharmacist Assistant [phone]  Follow-Up:  Pharmacist Review

## 2020-04-04 DIAGNOSIS — J301 Allergic rhinitis due to pollen: Secondary | ICD-10-CM | POA: Diagnosis not present

## 2020-04-04 DIAGNOSIS — J3089 Other allergic rhinitis: Secondary | ICD-10-CM | POA: Diagnosis not present

## 2020-04-04 DIAGNOSIS — J3081 Allergic rhinitis due to animal (cat) (dog) hair and dander: Secondary | ICD-10-CM | POA: Diagnosis not present

## 2020-04-07 DIAGNOSIS — I25810 Atherosclerosis of coronary artery bypass graft(s) without angina pectoris: Secondary | ICD-10-CM | POA: Insufficient documentation

## 2020-04-07 NOTE — Progress Notes (Signed)
Virtual Visit via Video Note   This visit type was conducted due to national recommendations for restrictions regarding the COVID-19 Pandemic (e.g. social distancing) in an effort to limit this patient's exposure and mitigate transmission in our community.  Due to his co-morbid illnesses, this patient is at least at moderate risk for complications without adequate follow up.  This format is felt to be most appropriate for this patient at this time.  All issues noted in this document were discussed and addressed.  A limited physical exam was performed with this format.  Please refer to the patient's chart for his consent to telehealth for Joshua [name].       Date:  04/08/2020   ID:  Joshua [name], DOB 09/27/1940, MRN [id number] The patient was identified by sight (video)  Patient Location: Home Provider Location: Home Office  PCP:  [name], MD  Cardiologist:  [name], MD  Electrophysiologist:  None   Evaluation Performed:  Follow-Up Visit  Chief Complaint:  Back and joint pain.   History of Present Illness:    Joshua [name] is a 80 y.o. male with a history of CAD s/p CABG, HTN, HLD, pancreatitis and OSA on CPAP. He had a negative perfusion study in 2015. He had chest pain in January 2020 and routine treadmill stress test showing no ischemia.   Due to recurrent chest discomfort, patient eventually underwent cardiac catheterization on 04/13/2019 that revealed severe triple-vessel coronary artery disease s/p 5 vessel bypass with 5 out of 5 patent bypass grafts. The native LAD was occluded beyond the first diagonal branch. Patent LIMA graft. The circumflex is occluded at the ostium. The moderate caliber distal OM branch fills from the patent vein graft. The small caliber more proximal OM fills retrograde from collaterals. The native RCA is occluded proximally. The vein graft to the distal RCA is patent. ADES was successfully placed to origin to proximal graft  lesion saphenous graft to third marginal.Patient had normal left ventricular systolic function. He was discharged on aspirin and Plavix .  He has been intolerant of Repatha.   Since I last saw him he has done well.  He did take NTG SL once because he had SOB in the middle of the night although he thinks it could have been his asthma.  He is not exercising as much as he would like because of back and knee problems and he is going to need back and knee injections.  He has been seeing Dr. Debara Pickett and has been intolerant of changes in statin and PCSK9.  He was started on Zetia.    The patient does not have symptoms concerning for COVID-19 infection (fever, chills, cough, or new shortness of breath).    Past Medical History:  Diagnosis Date  . Acute medial meniscal tear   . Anemia   . Arthritis    "middle finger right hand; right knee; neck" (02/06/2014)  . Asthma   . Bladder cancer ([place]) 04/2010   "cauterized during prostate OR"  . CAD (coronary artery disease)    CABG 2001  . Depression   . Diverticulosis   . DJD (degenerative joint disease)    BACK  . GERD (gastroesophageal reflux disease)   . H/O inguinal hernia repair 12/2018  . History of gout   . Hyperlipidemia   . Hypertension   . IBS (irritable bowel syndrome)   . Obesity   . OSA (obstructive sleep apnea)    USES CPAP   . Pancreatitis ~  1980  . Prostate cancer ([place]) 04/2010   Past Surgical History:  Procedure Laterality Date  . ANTERIOR CERVICAL DECOMP/DISCECTOMY FUSION  05/26/2006   PLATE PLACED  . CARDIAC CATHETERIZATION  11/20/1999  . CORONARY ARTERY BYPASS GRAFT  11/20/1999   SVG-RI1-RI2, SVG-OM, SVG-dRCA  . CORONARY STENT INTERVENTION N/A 04/13/2019   Procedure: CORONARY STENT INTERVENTION;  Surgeon: Burnell Blanks, MD;  Location: [facility];  Service: Cardiovascular;  Laterality: N/A;  . INGUINAL HERNIA REPAIR Left 01/02/2019   Procedure: OPEN LEFT INGUINAL HERNIA REPAIR WITH MESH;  Surgeon: Johnathan Hausen, MD;  Location: [facility];  Service: General;  Laterality: Left;  . KNEE ARTHROSCOPY Right 1992; 04/2008  . LAPAROSCOPIC CHOLECYSTECTOMY  2007  . LEFT HEART CATH AND CORS/GRAFTS ANGIOGRAPHY N/A 04/13/2019   Procedure: LEFT HEART CATH AND CORS/GRAFTS ANGIOGRAPHY;  Surgeon: Burnell Blanks, MD;  Location: [facility];  Service: Cardiovascular;  Laterality: N/A;  . NASAL SEPTUM SURGERY Right 2007  . ROBOT ASSISTED LAPAROSCOPIC RADICAL PROSTATECTOMY  04/2010  . THYROIDECTOMY, PARTIAL  1980     Prior to Admission medications   Medication Sig Start Date End Date Taking? Authorizing Provider  acetaminophen (TYLENOL) 500 MG tablet Take 1,000 mg by mouth as needed for moderate pain or headache.    Yes [provider]  albuterol (PROAIR HFA) 108 (90 BASE) MCG/ACT inhaler Inhale 1-2 puffs into the lungs every 6 (six) hours as needed for wheezing or shortness of breath. 10/19/14  Yes [name], MD  amLODipine (NORVASC) 5 MG tablet TAKE 1 TABLET BY MOUTH EVERY DAY 07/25/19  Yes Kroeger, Daleen Snook M., PA-C  aspirin EC 81 MG tablet Take 81 mg by mouth at bedtime.    Yes [provider]  Cholecalciferol (VITAMIN D3) 50 MCG (2000 UT) TABS Take 2,000 Units by mouth daily.   Yes [provider]  clopidogrel (PLAVIX) 75 MG tablet Take 1 tablet (75 mg total) by mouth daily with breakfast. 05/12/19  Yes [name], MD  diazepam (VALIUM) 5 MG tablet TAKE 1 TABLET (5 MG TOTAL) BY MOUTH AT BEDTIME AS NEEDED. 08/15/19  Yes [name], MD  EPINEPHrine 0.3 mg/0.3 mL IJ SOAJ injection See admin instructions. for allergic reaction 04/24/19  Yes [provider]  ezetimibe (ZETIA) 10 MG tablet Take 1 tablet (10 mg total) by mouth daily. 02/09/20 05/09/20 Yes Hilty, Nadean Corwin, MD  FLOVENT HFA 110 MCG/ACT inhaler Inhale 1 puff into the lungs 2 (two) times daily.  07/17/14  Yes [provider]  fluticasone (FLONASE) 50 MCG/ACT nasal spray Place 1 spray into  both nostrils 2 (two) times daily.  09/05/12  Yes Ricard Dillon, MD  folic acid (FOLVITE) 102 MCG tablet Take 800 mcg by mouth daily.    Yes [provider]  gabapentin (NEURONTIN) 300 MG capsule Take 1 cap in AM, 1 cap at noon, 2 caps at bedtime 10/03/19  Yes Cameron Sprang, MD  hydrochlorothiazide (HYDRODIURIL) 25 MG tablet TAKE 1 TABLET BY MOUTH EVERY DAY 03/29/20  Yes [name], MD  ketoconazole (NIZORAL) 2 % cream Apply 1 application topically 2 (two) times daily as needed for irritation.  02/01/19  Yes [provider]  loratadine (CLARITIN) 10 MG tablet Take 10 mg by mouth at bedtime.    Yes [provider]  losartan (COZAAR) 100 MG tablet TAKE 1 TABLET BY MOUTH EVERY DAY 09/20/19  Yes [name], MD  Miconazole Nitrate (LOTRIMIN AF DEODORANT POWDER) 2 %  AERP Apply 1 spray topically 2 (two) times daily.   Yes [provider]  nitroGLYCERIN (NITROSTAT) 0.4 MG SL tablet PLACE 1 TABLET UNDER THE TONGUE EVERY 5 (FIVE) MINUTES AS NEEDED. 03/11/20  Yes [name], MD  pantoprazole (PROTONIX) 40 MG tablet TAKE 1 TABLET BY MOUTH EVERY DAY 12/19/19  Yes Kroeger, Daleen Snook M., PA-C  polyethylene glycol powder (GLYCOLAX/MIRALAX) powder Take 17 g by mouth daily. Patient taking differently: Take 17 g by mouth in the morning and at bedtime. 08/29/14  Yes Tanna Furry, MD  simvastatin (ZOCOR) 20 MG tablet TAKE 1 TABLET BY MOUTH EVERYDAY AT BEDTIME 03/14/20  Yes Sravya Grissom, Jeneen Rinks, MD  traMADol (ULTRAM) 50 MG tablet Take 1 tablet (50 mg total) by mouth every 6 (six) hours as needed. 01/02/19  Yes Johnathan Hausen, MD  vitamin B-12 (CYANOCOBALAMIN) 1000 MCG tablet Take 1,000 mcg by mouth daily.   Yes [provider]      Allergies:   Benadryl [diphenhydramine], Bromfed, Clarithromycin, Codeine, Doxycycline hyclate, Modafinil, Oxycodone-acetaminophen, Promethazine hcl, Quinolones, Telithromycin, Hydrocodone-acetaminophen, Statins, and Sulfonamide derivatives    Social History   Tobacco Use  . Smoking status: Former Smoker    Packs/day: 1.00    Years: 35.00    Pack years: 35.00    Types: Cigarettes    Quit date: 06/16/1992    Years since quitting: 27.8  . Smokeless tobacco: Never Used  Vaping Use  . Vaping Use: Never used  Substance Use Topics  . Alcohol use: Yes    Alcohol/week: 7.0 standard drinks    Types: 7 Shots of liquor per week    Comment: mixed drink Q DAY  . Drug use: No     Family Hx: The patient's family history includes Diabetes in his brother; Heart disease in his father and mother; Hyperlipidemia in his father and mother.  ROS:   Please see the history of present illness.    Positive for fatigue   All other systems reviewed and are negative.   Prior CV studies:   The following studies were reviewed today:  None  Labs/Other Tests and Data Reviewed:    EKG:  No ECG reviewed.  Recent Labs: 08/25/2019: TSH 0.99 02/23/2020: ALT 18; BUN 17; Creat 1.33; Hemoglobin 15.0; Platelets 222; Potassium 4.3; Sodium 132   Recent Lipid Panel Lab Results  Component Value Date/Time   CHOL 149 02/23/2020 09:33 AM   CHOL 215 (H) 12/08/2019 09:13 AM   TRIG 130 02/23/2020 09:33 AM   HDL 42 02/23/2020 09:33 AM   HDL 38 (L) 12/08/2019 09:13 AM   CHOLHDL 3.5 02/23/2020 09:33 AM   LDLCALC 84 02/23/2020 09:33 AM   LDLDIRECT 115.0 06/04/2016 09:19 AM    Wt Readings from Last 3 Encounters:  04/08/20 210 lb (95.3 kg)  03/20/20 217 lb (98.4 kg)  02/23/20 213 lb 3.2 oz (96.7 kg)     Risk Assessment/Calculations:      Objective:    Vital Signs:  Pulse 78   Temp 97.7 F (36.5 C)   Ht 5' 6.75" (1.695 m)   Wt 210 lb (95.3 kg)   SpO2 96%   BMI 33.14 kg/m    VITAL SIGNS:  reviewed  ASSESSMENT & PLAN:      CADs/pCABG: Patient underwent cardiac catheterization recently on 04/13/2019 and had PCI DES to SVG to OM 3.  He needs to come off of Plavix for back and knee injections and he is fine to do this and can be on  ASA only.  Hyperlipidemia:   I will order a lipid profile for his follow up with Dr. Debara Pickett.  He will likely be started on bempadoic acid.    Hypertension:   The BP is not able to be checked as his batteries ran out.  However, he has had good BPs at recent checks.    Obstructive sleep apnea:   He is on CPAP.           Time:   Today, I have spent 16 minutes with the patient with telehealth technology discussing the above problems.     Medication Adjustments/Labs and Tests Ordered: Current medicines are reviewed at length with the patient today.  Concerns regarding medicines are outlined above.   Tests Ordered: No orders of the defined types were placed in this encounter.   Medication Changes: No orders of the defined types were placed in this encounter.   Follow Up:  In Person in six months.  Signed, [name], MD  04/08/2020 9:23 AM    [facility]

## 2020-04-08 ENCOUNTER — Encounter: Payer: Self-pay | Admitting: Cardiology

## 2020-04-08 ENCOUNTER — Ambulatory Visit: Payer: PPO | Admitting: Cardiology

## 2020-04-08 ENCOUNTER — Telehealth (INDEPENDENT_AMBULATORY_CARE_PROVIDER_SITE_OTHER): Payer: PPO | Admitting: Cardiology

## 2020-04-08 ENCOUNTER — Ambulatory Visit: Payer: PPO

## 2020-04-08 ENCOUNTER — Telehealth: Payer: Self-pay

## 2020-04-08 VITALS — HR 78 | Temp 97.7°F | Ht 66.75 in | Wt 210.0 lb

## 2020-04-08 DIAGNOSIS — I10 Essential (primary) hypertension: Secondary | ICD-10-CM

## 2020-04-08 DIAGNOSIS — E785 Hyperlipidemia, unspecified: Secondary | ICD-10-CM

## 2020-04-08 DIAGNOSIS — G4733 Obstructive sleep apnea (adult) (pediatric): Secondary | ICD-10-CM

## 2020-04-08 DIAGNOSIS — I25810 Atherosclerosis of coronary artery bypass graft(s) without angina pectoris: Secondary | ICD-10-CM

## 2020-04-08 NOTE — Telephone Encounter (Signed)
  Patient Consent for Virtual Visit         Joshua [name] has provided verbal consent on 04/08/2020 for a virtual visit (video or telephone).   CONSENT FOR VIRTUAL VISIT FOR:  Joshua [name]  By participating in this virtual visit I agree to the following:  I hereby voluntarily request, consent and authorize [facility] and its employed or contracted physicians, physician assistants, nurse practitioners or other licensed health care professionals (the Practitioner), to provide me with telemedicine health care services (the "Services") as deemed necessary by the treating Practitioner. I acknowledge and consent to receive the Services by the Practitioner via telemedicine. I understand that the telemedicine visit will involve communicating with the Practitioner through live audiovisual communication technology and the disclosure of certain medical information by electronic transmission. I acknowledge that I have been given the opportunity to request an in-person assessment or other available alternative prior to the telemedicine visit and am voluntarily participating in the telemedicine visit.  I understand that I have the right to withhold or withdraw my consent to the use of telemedicine in the course of my care at any time, without affecting my right to future care or treatment, and that the Practitioner or I may terminate the telemedicine visit at any time. I understand that I have the right to inspect all information obtained and/or recorded in the course of the telemedicine visit and may receive copies of available information for a reasonable fee.  I understand that some of the potential risks of receiving the Services via telemedicine include:  [name] Delay or interruption in medical evaluation due to technological equipment failure or disruption; . Information transmitted may not be sufficient (e.g. poor resolution of images) to allow for appropriate medical decision making by the  Practitioner; and/or  . In rare instances, security protocols could fail, causing a breach of personal health information.  Furthermore, I acknowledge that it is my responsibility to provide information about my medical history, conditions and care that is complete and accurate to the best of my ability. I acknowledge that Practitioner's advice, recommendations, and/or decision may be based on factors not within their control, such as incomplete or inaccurate data provided by me or distortions of diagnostic images or specimens that may result from electronic transmissions. I understand that the practice of medicine is not an exact science and that Practitioner makes no warranties or guarantees regarding treatment outcomes. I acknowledge that a copy of this consent can be made available to me via my patient portal ([facility]), or I can request a printed copy by calling the office of [facility].    I understand that my insurance will be billed for this visit.   I have read or had this consent read to me. . I understand the contents of this consent, which adequately explains the benefits and risks of the Services being provided via telemedicine.  . I have been provided ample opportunity to ask questions regarding this consent and the Services and have had my questions answered to my satisfaction. . I give my informed consent for the services to be provided through the use of telemedicine in my medical care

## 2020-04-08 NOTE — Patient Instructions (Addendum)
   Medication Instructions:  STOP CLOPIDOGREL   *If you need a refill on your cardiac medications before your next appointment, please call your pharmacy*   Lab Work: FASTING LP/HFT SOON   If you have labs (blood work) drawn today and your tests are completely normal, you will receive your results only by: [name] MyChart Message (if you have MyChart) OR . A paper copy in the mail If you have any lab test that is abnormal or we need to change your treatment, we will call you to review the results.   Testing/Procedures: NONE    Follow-Up: At [facility], you and your health needs are our priority.  As part of our continuing mission to provide you with exceptional heart care, we have created designated Provider Care Teams.  These Care Teams include your primary Cardiologist (physician) and Advanced Practice Providers (APPs -  Physician Assistants and Nurse Practitioners) who all work together to provide you with the care you need, when you need it.  We recommend signing up for the patient portal called "MyChart".  Sign up information is provided on this After Visit Summary.  MyChart is used to connect with patients for Virtual Visits (Telemedicine).  Patients are able to view lab/test results, encounter notes, upcoming appointments, etc.  Non-urgent messages can be sent to your provider as well.   To learn more about what you can do with MyChart, go to [email or web address].    Your next appointment:   6 MONTHS   The format for your next appointment:   In Person  Provider:   You may see [name], MD or one of the following Advanced Practice Providers on your designated Care Team:    Rosaria Ferries, PA-C  Jory Sims, DNP, ANP   You have been referred to [facility]

## 2020-04-08 NOTE — Progress Notes (Signed)
Chronic Care Management Pharmacy Name: Joshua [name]     MRN: [id number]     DOB: February 05, 1941  Chief Complaint/ HPI Joshua [name], 80 y.o., male, presents for their initial CCM visit with the clinical pharmacist via telephone due to COVID-19 pandemic.  PCP: Joshua Olp, MD Encounter Diagnoses  Name Primary?  . Essential hypertension Yes  . Hyperlipidemia, unspecified hyperlipidemia type     Office Visits:  02/23/2020(PCP): physical. Elliptical or weight lifting 3-4x/wk. Diet limited by what pt can eat  - tolerating salds more lately. CAD,HLD CABGx5 - follows with Dr. Percival [name]. Restarted on simvastatin 34m, needs plan for zetia 10 mg. Did not tolerate repatha. DES 04/13/2019. DAPT  Consult Visit: 04/08/2020 (Dr Joshua [name]: ok to stop DAPT due to pt wanting back/knee injections - ASA monotherapy now. 03/20/2020 (Dr Joshua [name]: chronic pain. monovisc injection. Steroid tx awaiting approval of cardiology.  Patient Active Problem List   Diagnosis Date Noted  . Coronary artery disease involving coronary bypass graft of native heart without angina pectoris 04/07/2020  . Trigger thumb, left thumb 01/24/2020  . Overactive bladder 11/24/2019  . Left lateral epicondylitis 09/05/2019  . Degenerative arthritis of left knee 08/08/2019  . Educated about COVID-19 virus infection 04/20/2019  . Unstable angina ([place]   . Greater trochanteric bursitis of left hip 10/27/2018  . Degenerative disc disease, lumbar 10/27/2018  . Abdominal muscle strain, initial encounter 06/16/2018  . Aortic atherosclerosis ([place] 05/05/2018  . Hyperglycemia 10/07/2017  . Weakness of left leg 06/21/2017  . DJD (degenerative joint disease)   . Arthritis   . Neck pain 02/08/2017  . Venous insufficiency 12/29/2016  . Ganglion cyst of tendon sheath of right hand 12/29/2016  . Avulsion fracture of metatarsal bone of right foot 11/25/2016  . Gout 11/20/2016  . Obesity, Class I, BMI 30-34.9 10/03/2015  . Hereditary  and idiopathic peripheral neuropathy 09/25/2014  . Allergic rhinitis 05/31/2014  . Fatigue 11/09/2013  . CKD (chronic kidney disease), stage III ([place] 10/23/2013  . Dizziness 08/23/2013  . Neuropathy ([place] 08/21/2013  . RLS (restless legs syndrome) 01/03/2013  . Chronic bilateral low back pain with left-sided sciatica 10/09/2011  . History of bladder cancer 04/01/2010  . OSTEOARTHRITIS, LOWER LEG, LEFT 10/04/2009  . ANEMIA, IRON DEFICIENCY 11/06/2008  . 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 Essential hypertension 02/21/2007  . OSA (obstructive sleep apnea) 11/02/2006  . Hyperlipidemia 10/12/2006  . Depression, major, single episode, in partial remission ([place] 10/12/2006  . Coronary atherosclerosis 10/12/2006  . Asthma 10/12/2006  . GERD 10/12/2006  . Irritable bowel syndrome 10/12/2006   Past Surgical History:  Procedure Laterality Date  . ANTERIOR CERVICAL DECOMP/DISCECTOMY FUSION  05/26/2006   PLATE PLACED  . CARDIAC CATHETERIZATION  11/20/1999  . CORONARY ARTERY BYPASS GRAFT  11/20/1999   SVG-RI1-RI2, SVG-OM, SVG-dRCA  . CORONARY STENT INTERVENTION N/A 04/13/2019   Procedure: CORONARY STENT INTERVENTION;  Surgeon: Joshua Blanks MD;  [facility];  Service: Cardiovascular;  Laterality: N/A;  . INGUINAL HERNIA REPAIR Left 01/02/2019   Procedure: OPEN LEFT INGUINAL HERNIA REPAIR WITH MESH;  Surgeon: MJohnathan Hausen MD;  Location: [facility];  Service: General;  Laterality: Left;  . KNEE ARTHROSCOPY Right 1992; 04/2008  . LAPAROSCOPIC CHOLECYSTECTOMY  2007  . LEFT HEART CATH AND CORS/GRAFTS ANGIOGRAPHY N/A 04/13/2019   Procedure: LEFT HEART CATH AND CORS/GRAFTS ANGIOGRAPHY;  Surgeon: Joshua Blanks MD;  [facility];  Service: Cardiovascular;  Laterality: N/A;  . NASAL SEPTUM SURGERY Right  2007  . ROBOT ASSISTED LAPAROSCOPIC RADICAL PROSTATECTOMY  04/2010  . THYROIDECTOMY, PARTIAL  1980   Family History  Problem Relation Age of  Onset  . Heart disease Mother   . Hyperlipidemia Mother   . Heart disease Father   . Hyperlipidemia Father   . Diabetes Brother    Social History   Social History Narrative   Married 42 years in 2015. No kids (mumps at age 35)      Retired from [employer] in [place] at [employer]: poker, movies and tv, staying active      Right handed   2 level home with steps he uses   Allergies  Allergen Reactions  . 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 Bromfed Nausea And Vomiting  . Clarithromycin Other (See Comments)     gastritis  . Codeine Other (See Comments)    Trouble breathing  . Doxycycline Hyclate Nausea And Vomiting  . Modafinil Other (See Comments)     anxiety-nervousness  . Oxycodone-Acetaminophen Itching  . Promethazine Hcl Other (See Comments)     fainting  . Quinolones Nausea Only    Cipro "felt real bad"  . Telithromycin Nausea And Vomiting  . Hydrocodone-Acetaminophen Rash  . Statins Other (See Comments)    Leg cramps and makes patient feel bad. Per pt tried multiple in years past  . Sulfonamide Derivatives Rash   Outpatient Encounter Medications as of 04/08/2020  Medication Sig Note  . amLODipine (NORVASC) 5 MG tablet TAKE 1 TABLET BY MOUTH EVERY DAY   . aspirin EC 81 MG tablet Take 81 mg by mouth at bedtime.    . Cholecalciferol (VITAMIN D3) 50 MCG (2000 UT) TABS Take 2,000 Units by mouth daily.   [name] ezetimibe (ZETIA) 10 MG tablet Take 1 tablet (10 mg total) by mouth daily.   . folic acid (FOLVITE) 938 MCG tablet Take 800 mcg by mouth daily.    [name] gabapentin (NEURONTIN) 300 MG capsule Take 1 cap in AM, 1 cap at noon, 2 caps at bedtime   . Glucos-Chond-Hyal Ac-Ca Fructo (MOVE FREE JOINT HEALTH ADVANCE PO) Take by mouth.   . hydrochlorothiazide (HYDRODIURIL) 25 MG tablet TAKE 1 TABLET BY MOUTH EVERY DAY   . loratadine (CLARITIN) 10 MG tablet Take 10 mg by mouth at bedtime.    [name]  losartan (COZAAR) 100 MG tablet TAKE 1 TABLET BY MOUTH EVERY DAY   . pantoprazole (PROTONIX) 40 MG tablet TAKE 1 TABLET BY MOUTH EVERY DAY   . simvastatin (ZOCOR) 20 MG tablet TAKE 1 TABLET BY MOUTH EVERYDAY AT BEDTIME   . vitamin B-12 (CYANOCOBALAMIN) 1000 MCG tablet Take 1,000 mcg by mouth daily.   [name] acetaminophen (TYLENOL) 500 MG tablet Take 1,000 mg by mouth as needed for moderate pain or headache.    . albuterol (PROAIR HFA) 108 (90 BASE) MCG/ACT inhaler Inhale 1-2 puffs into the lungs every 6 (six) hours as needed for wheezing or shortness of breath.   . diazepam (VALIUM) 5 MG tablet TAKE 1 TABLET (5 MG TOTAL) BY MOUTH AT BEDTIME AS NEEDED.   [name] EPINEPHrine 0.3 mg/0.3 mL IJ SOAJ injection See admin instructions. for allergic reaction   . FLOVENT HFA 110 MCG/ACT inhaler Inhale 1 puff into the lungs 2 (two) times daily.    . fluticasone (FLONASE) 50 MCG/ACT nasal spray Place 1 spray into both nostrils 2 (two) times  daily.    . ketoconazole (NIZORAL) 2 % cream Apply 1 application topically 2 (two) times daily as needed for irritation.    . Miconazole Nitrate (LOTRIMIN AF DEODORANT POWDER) 2 % AERP Apply 1 spray topically 2 (two) times daily.   . nitroGLYCERIN (NITROSTAT) 0.4 MG SL tablet PLACE 1 TABLET UNDER THE TONGUE EVERY 5 (FIVE) MINUTES AS NEEDED.   [name] polyethylene glycol powder (GLYCOLAX/MIRALAX) powder Take 17 g by mouth daily. (Patient taking differently: Take 17 g by mouth in the morning and at bedtime.)   . traMADol (ULTRAM) 50 MG tablet Take 1 tablet (50 mg total) by mouth every 6 (six) hours as needed. 05/02/2019: Took last week, does not take often  . [DISCONTINUED] clopidogrel (PLAVIX) 75 MG tablet Take 1 tablet (75 mg total) by mouth daily with breakfast.    No facility-administered encounter medications on file as of 04/08/2020.   Patient Care Team    Relationship Specialty Notifications Start End  Joshua Olp, MD PCP - General Family Medicine  11/09/13    Comment: Beryle Quant, MD PCP - Cardiology Cardiology Admissions 03/29/17   Gerda Diss, DO Consulting Physician Sports Medicine  11/25/16   Cameron Sprang, MD Consulting Physician Neurology  04/25/18   Chesley Mires, MD Consulting Physician Pulmonary Disease  02/06/19   Lyndal Pulley, DO Consulting Physician Sports Medicine  02/06/19   Tiajuana Amass, MD Consulting Physician Allergy and Immunology  02/06/19   Madelin Rear, [facility]  Pharmacist  03/06/20   Madelin Rear, [facility] Pharmacist Pharmacist  03/06/20    Current Diagnosis/Assessment: Goals Addressed            This Visit's Progress   . PharmD Care Plan       CARE PLAN ENTRY (see longitudinal plan of care for additional care plan information)  Current Barriers:  . Chronic Disease Management support, education, and care coordination needs related to Hypertension and Hyperlipidemia   Hypertension BP Readings from Last 3 Encounters:  03/20/20 118/82  02/23/20 126/70  02/09/20 139/77   . Pharmacist Clinical Goal(s): o Over the next 365 days, patient will work with PharmD and providers to maintain BP goal <130/80 . Current regimen:  o Losartan 100 mg daily  o Amlodipine 5 mg once daily o HCTZ 25 mg once daily . Interventions: o Reviewed diet and exercise - Maintain a healthy weight and exercise regularly, as directed by your health care provider. Eat healthy foods, such as: Lean proteins, complex carbohydrates, fresh fruits and vegetables, low-fat dairy products, healthy fats. . Patient self care activities - Over the next 365 days, patient will: o Check BP at least once every 1-2 weeks, document, and provide at future appointments o Ensure daily salt intake < 2300 mg/day  Hyperlipidemia Lab Results  Component Value Date/Time   LDLCALC 84 02/23/2020 09:33 AM   LDLDIRECT 115.0 06/04/2016 09:19 AM   . Pharmacist Clinical Goal(s): o Over the next 180 days, patient will work with PharmD and providers to achieve LDL goal <  70 . Current regimen:  o Simvastatin 20 mg o Zetia 10 mg once daily . Interventions: o Reviewed alternative cholesterol medications - offered patient assistance support -  . Patient self care activities - Over the next 90 days, patient will: o Let pharmacist know if there are any questions or financial concerns   Medication management . Pharmacist Clinical Goal(s): o Over the next 90 days, patient will work with PharmD and providers to achieve optimal  medication adherence . Current pharmacy[facility] . Interventions o Comprehensive medication review performed. o Continue current medication management strategy. o Discussed pharmacy services through [facility] pharmacy. . Patient self care activities - Over the next 90 days, patient will: o Focus on medication adherence by taking medications as prescribed o Report any questions or concerns to PharmD and/or provider(s) Initial goal documentation.      Hypertension   BP goal <130/80  BP Readings from Last 3 Encounters:  03/20/20 118/82  02/23/20 126/70  02/09/20 139/77    BMP Latest Ref Rng & Units 02/23/2020 08/25/2019 06/06/2019  Glucose 65 - 99 mg/dL 99 120(H) 84  BUN 7 - 25 mg/dL '17 18 20  ' Creatinine 0.70 - 1.18 mg/dL 1.33(H) 1.22 1.26  BUN/Creat Ratio 6 - 22 (calc) 13 - -  Sodium 135 - 146 mmol/L 132(L) 134(L) 139  Potassium 3.5 - 5.3 mmol/L 4.3 3.9 4.1  Chloride 98 - 110 mmol/L 97(L) 98 104  CO2 20 - 32 mmol/L '29 30 29  ' Calcium 8.6 - 10.3 mg/dL 9.5 9.7 9.8   Previous medications: Benicar-HCT 20-12.5 & 40-25  mg, irbesartan 300 mg, losartan 50 mg, losartan-HCTZ 100-25 mg, metoprolol tartrate 25 mg, valsartan-HCTZ 320-25 mg.   Trying to exercise more and wants to get back to the [employer] on a more routine basis, goal to go 3-4x/week. No vegetables or fish. Cereal/yogurt/bagel for breakfast. Sandwich w/ apple for lunch, couple pieces of chocolate. Minimal alcohol, maybe 1x/wk. Not eating a lot for dinner. BP cuff currently out  of batteries so no recent home readings to provide.   Denies dizziness. Patient is currently at goal on the following medications:  . Losartan 100 mg once daily  . Amlodipine 5 mg once daily in the morning . HCTZ 25 mg once daily in the afternoon  We discussed diet/exercise - Maintain a healthy weight and exercise regularly, as directed by your health care provider. Eat healthy foods, such as: Lean proteins, complex carbohydrates, fresh fruits and vegetables, low-fat dairy products, healthy fats.  Plan  Continue current medications.  Hyperlipidemia   LDL goal < 70     Component Value Date/Time   CHOL 149 02/23/2020 0933   CHOL 215 (H) 12/08/2019 0913   CHOL 96 11/03/2018 0806   CHOL 95 01/27/2018 0900   CHOL 81 (L) 12/30/2016 0848   TRIG 130 02/23/2020 0933   TRIG 240 (H) 12/08/2019 0913   TRIG 158.0 (H) 11/03/2018 0806   HDL 42 02/23/2020 0933   HDL 38 (L) 12/08/2019 0913   HDL 46.20 11/03/2018 0806   HDL 49 01/27/2018 0900   HDL 42 12/30/2016 0848   LDLCALC 84 02/23/2020 0933   LDLCALC 134 (H) 12/08/2019 0913   LDLCALC 18 11/03/2018 0806   LDLCALC 16 01/27/2018 0900   LDLCALC 16 10/01/2017 1703   LDLCALC 9 12/30/2016 0848   LDLCALC 76 10/31/2015 0803   LDLDIRECT 115.0 06/04/2016 0919   LDLDIRECT 81.1 07/04/2009 1118    Hepatic Function Latest Ref Rng & Units 02/23/2020 08/25/2019 06/06/2019  Total Protein 6.1 - 8.1 g/dL 6.8 6.3 6.3  Albumin 3.5 - 5.2 g/dL - 4.1 3.8  AST 10 - 35 U/L '18 15 16  ' ALT 9 - 46 U/L '18 17 19  ' Alk Phosphatase 39 - 117 U/L - 63 58  Total Bilirubin 0.2 - 1.2 mg/dL 1.1 0.6 0.8  Bilirubin, Direct 0.00 - 0.40 mg/dL - - -    Previous medications: Repatha, atorvastatin 40 mg, rosuvastatin 10 &  20 mg, simvastatin 40 mg.   Has intolerances to many cholesterol medications, most recent intolerance noted with repatha which had been working well from a cholesterol standpoint. Seen by Dr. Percival [name] this morning who suggested staring a different newer  cholesterol medication. Pt planning to schedule f/u visit with Dr Debara Pickett next month to discuss medication.  CKD III, CAD, Aortic atherosclerosis. Possible that pt may start on Nexletol at f/u with Dr Debara Pickett. Patient is currently not at goal the following medications:  . Simvastatin 20 mg once daily in evening . Zetia 10 mg once daily in the afternoon  DES 03/2019, Hx of CABG x5. Plavix stopped 04/08/2020 due to pt wanted to receive injections with Dr. Tamala [name] to help with pain. Currently on the following medications:  . Aspirin 81 mg once daily  Reviewed side effects no problems noted.  Plan  Plavix stopped earlier today 04/08/2020.  Pt to schedule f/u with Dr. Debara Pickett.  GERD   Lab Results  Component Value Date   [id number] 098 02/23/2020   Previous medications: omeprazole.   Tolerating pantoprazole well, previously was taking omeprazole but stopped once Plavix started. Plavix was stopped this morning by Dr Joshua [name] so asking if should be switched back to omeprazole.   Denies recent acid reflux. currently no concerns with cost of pantoprazole. No longer on DAPT as of 04/08/2020. Currently controlled on: . Pantoprazole 40 mg once daily   Counseled on PPI, answered questions about drug interactions (omeprazole with Plavix vs pantoprazole with plavix).  Plan   Continue current medication.  Neuropathy    Complaint of feeling tired throughout the day, takes 1-2 naps.  Patient is currently controlled on the following medications:  . Gabapentin 300 mg capsule - 1200 mg/day  Counseled on gabapentin, reviewed side effects.   Plan  Pt to discuss potential dose reduction of gabapentin 04/2020 f/u with Dr Delice Lesch.  Vaccines   Immunization History  Administered Date(s) Administered  . Fluad Quad(high Dose 65+) 11/21/2018  . Influenza Split 01/08/2012, 11/12/2014  . Influenza Whole 12/08/2007, 12/26/2008, 12/22/2010  . Influenza, High Dose Seasonal PF 11/16/2015, 01/01/2017, 11/24/2019   . Influenza,inj,Quad PF,6+ Mos 11/30/2012  . Influenza-Unspecified 01/12/2014, 12/21/2017  . PFIZER(Purple Top)SARS-COV-2 Vaccination 04/28/2019, 05/23/2019, 12/21/2019  . Pneumococcal Conjugate-13 05/31/2014  . Pneumococcal Polysaccharide-23 06/04/2006, 07/15/2012  . Td 04/23/2005  . Tdap 02/18/2016  . Zoster 01/08/2012  . Zoster Recombinat (Shingrix) 01/31/2018, 04/02/2018   Reviewed and discussed patient's vaccination history.    Plan  No recommendations at this time.  Medication Management / Care Coordination   Rece[facility] medications [facility]:  [facility] [place] [place] [address] Phone: [phone] Fax: [phone]  [facility] [place] [place] [facility] [address] [place] [address] Phone: [phone] Fax: [place] [address] [place] [address] Phone: [phone] Fax: [place], [place] [address] [place] [address] Phone: [phone] Fax: [phone]   Pharmacy services declined today, feels may be beneficial to health. Would like to discuss at f/u visit.  Plan  Continue current medication management strategy. ___________________________ SDOH (Social Determinants of Health) assessments performed: Yes. Future Appointments  Date Time Provider [place]  05/01/2020 10:00 AM Cameron Sprang, MD [facility] None  06/10/2020 10:30 AM [facility] CCM PHARMACIST [facility]  06/26/2020 11:00 AM Joshua Olp, MD [facility]  Visit follow-up:  . CPA follow-up: n/a. [name] RPH follow-up: 2-3 month f/u. Review PAP needs, pharmacy services.  Madelin Rear, Pharm.D., BCGP Clinical Pharmacist [facility] [phone]

## 2020-04-09 ENCOUNTER — Ambulatory Visit: Payer: PPO | Admitting: Family Medicine

## 2020-04-09 NOTE — Patient Instructions (Signed)
Mr. Cupp,  Thank you for taking the time to review your medications with me today.  I have included our care plan/goals in the following pages. Please review and call me at [phone] with any questions!  Thanks! Ellin Mayhew, Pharm.D., BCGP Clinical Pharmacist [facility] [phone]   Goals Addressed            This Visit's Progress   . PharmD Care Plan       CARE PLAN ENTRY (see longitudinal plan of care for additional care plan information)  Current Barriers:  . Chronic Disease Management support, education, and care coordination needs related to Hypertension and Hyperlipidemia   Hypertension BP Readings from Last 3 Encounters:  03/20/20 118/82  02/23/20 126/70  02/09/20 139/77   . Pharmacist Clinical Goal(s): o Over the next 365 days, patient will work with PharmD and providers to maintain BP goal <130/80 . Current regimen:  o Losartan 100 mg daily  o Amlodipine 5 mg once daily o HCTZ 25 mg once daily . Interventions: o Reviewed diet and exercise - Maintain a healthy weight and exercise regularly, as directed by your health care provider. Eat healthy foods, such as: Lean proteins, complex carbohydrates, fresh fruits and vegetables, low-fat dairy products, healthy fats. . Patient self care activities - Over the next 365 days, patient will: o Check BP at least once every 1-2 weeks, document, and provide at future appointments o Ensure daily salt intake < 2300 mg/day  Hyperlipidemia Lab Results  Component Value Date/Time   LDLCALC 84 02/23/2020 09:33 AM   LDLDIRECT 115.0 06/04/2016 09:19 AM   . Pharmacist Clinical Goal(s): o Over the next 180 days, patient will work with PharmD and providers to achieve LDL goal < 70 . Current regimen:  o Simvastatin 20 mg o Zetia 10 mg once daily . Interventions: o Reviewed alternative cholesterol medications - offered patient assistance support -  . Patient self care activities -  Over the next 90 days, patient will: o Let pharmacist know if there are any questions or financial concerns   Medication management . Pharmacist Clinical Goal(s): o Over the next 90 days, patient will work with PharmD and providers to achieve optimal medication adherence . Current pharmacy: [facility] . Interventions o Comprehensive medication review performed. o Continue current medication management strategy. o Discussed pharmacy services through [facility]. . Patient self care activities - Over the next 90 days, patient will: o Focus on medication adherence by taking medications as prescribed o Report any questions or concerns to PharmD and/or provider(s) Initial goal documentation.      Mr. Carmickle was given information about Chronic Care Management services today including:  1. CCM service includes personalized support from designated clinical staff supervised by his physician, including individualized plan of care and coordination with other care providers 2. 24/7 contact phone numbers for assistance for urgent and routine care needs. 3. Standard insurance, coinsurance, copays and deductibles apply for chronic care management only during months in which we provide at least 20 minutes of these services. Most insurances cover these services at 100%, however patients may be responsible for any copay, coinsurance and/or deductible if applicable. This service may help you avoid the need for more expensive face-to-face services. 4. Only one practitioner may furnish and bill the service in a calendar month. 5. The patient may stop CCM services at any time (effective at the end of the month) by phone call to the  office staff.  Patient agreed to services and verbal consent obtained.   The patient verbalized understanding of instructions provided today and agreed to receive a mailed copy of patient instruction and/or educational materials. Telephone follow up appointment with pharmacy  team member scheduled for: See next appointment with "Care Management Staff" under "What's Next" below.   Madelin Rear, Pharm.D., BCGP Clinical Pharmacist [facility] [place] [phone]  High Cholesterol  High cholesterol is a condition in which the blood has high levels of a white, waxy substance similar to fat (cholesterol). The liver makes all the cholesterol that the body needs. The human body needs small amounts of cholesterol to help build cells. A person gets extra or excess cholesterol from the food that he or she eats. The blood carries cholesterol from the liver to the rest of the body. If you have high cholesterol, deposits (plaques) may build up on the walls of your arteries. Arteries are the blood vessels that carry blood away from your heart. These plaques make the arteries narrow and stiff. Cholesterol plaques increase your risk for heart attack and stroke. Work with your health care provider to keep your cholesterol levels in a healthy range. What increases the risk? The following factors may make you more likely to develop this condition:  Eating foods that are high in animal fat (saturated fat) or cholesterol.  Being overweight.  Not getting enough exercise.  A family history of high cholesterol (familial hypercholesterolemia).  Use of tobacco products.  Having diabetes. What are the signs or symptoms? There are no symptoms of this condition. How is this diagnosed? This condition may be diagnosed based on the results of a blood test.  If you are older than 78 years of age, your health care provider may check your cholesterol levels every 4-6 years.  You may be checked more often if you have high cholesterol or other risk factors for heart disease. The blood test for cholesterol measures:  "Bad" cholesterol, or LDL cholesterol. This is the main type of cholesterol that causes heart disease. The desired level is less than 100 mg/dL.  "Good"  cholesterol, or HDL cholesterol. HDL helps protect against heart disease by cleaning the arteries and carrying the LDL to the liver for processing. The desired level for HDL is 60 mg/dL or higher.  Triglycerides. These are fats that your body can store or burn for energy. The desired level is less than 150 mg/dL.  Total cholesterol. This measures the total amount of cholesterol in your blood and includes LDL, HDL, and triglycerides. The desired level is less than 200 mg/dL. How is this treated? This condition may be treated with:  Diet changes. You may be asked to eat foods that have more fiber and less saturated fats or added sugar.  Lifestyle changes. These may include regular exercise, maintaining a healthy weight, and quitting use of tobacco products.  Medicines. These are given when diet and lifestyle changes have not worked. You may be prescribed a statin medicine to help lower your cholesterol levels. Follow these instructions at home: Eating and drinking  Eat a healthy, balanced diet. This diet includes: ? Daily servings of a variety of fresh, frozen, or canned fruits and vegetables. ? Daily servings of whole grain foods that are rich in fiber. ? Foods that are low in saturated fats and trans fats. These include poultry and fish without skin, lean cuts of meat, and low-fat dairy products. ? A variety of fish, especially  oily fish that contain omega-3 fatty acids. Aim to eat fish at least 2 times a week.  Avoid foods and drinks that have added sugar.  Use healthy cooking methods, such as roasting, grilling, broiling, baking, poaching, steaming, and stir-frying. Do not fry your food except for stir-frying.   Lifestyle  Get regular exercise. Aim to exercise for a total of 150 minutes a week. Increase your activity level by doing activities such as gardening, walking, and taking the stairs.  Do not use any products that contain nicotine or tobacco, such as cigarettes, e-cigarettes,  and chewing tobacco. If you need help quitting, ask your health care provider.   General instructions  Take over-the-counter and prescription medicines only as told by your health care provider.  Keep all follow-up visits as told by your health care provider. This is important. Where to find more information  American Heart Association: www.heart.org  National Heart, Lung, and Blood Institute: https://wilson-eaton.com/ Contact a health care provider if:  You have trouble achieving or maintaining a healthy diet or weight.  You are starting an exercise program.  You are unable to stop smoking. Get help right away if:  You have chest pain.  You have trouble breathing.  You have any symptoms of a stroke. "BE FAST" is an easy way to remember the main warning signs of a stroke: ? B - Balance. Signs are dizziness, sudden trouble walking, or loss of balance. ? E - Eyes. Signs are trouble seeing or a sudden change in vision. ? F - Face. Signs are sudden weakness or numbness of the face, or the face or eyelid drooping on one side. ? A - Arms. Signs are weakness or numbness in an arm. This happens suddenly and usually on one side of the body. ? S - Speech. Signs are sudden trouble speaking, slurred speech, or trouble understanding what people say. ? T - Time. Time to call emergency services. Write down what time symptoms started.  You have other signs of a stroke, such as: ? A sudden, severe headache with no known cause. ? Nausea or vomiting. ? Seizure. These symptoms may represent a serious problem that is an emergency. Do not wait to see if the symptoms will go away. Get medical help right away. Call your local emergency services (911 in the [place] Do not drive yourself to the hospital. Summary  Cholesterol plaques increase your risk for heart attack and stroke. Work with your health care provider to keep your cholesterol levels in a healthy range.  Eat a healthy, balanced diet, get regular  exercise, and maintain a healthy weight.  Do not use any products that contain nicotine or tobacco, such as cigarettes, e-cigarettes, and chewing tobacco.  Get help right away if you have any symptoms of a stroke. This information is not intended to replace advice given to you by your health care provider. Make sure you discuss any questions you have with your health care provider. Document Revised: 02/06/2019 Document Reviewed: 02/06/2019 Elsevier Patient Education  2021 [employer].

## 2020-04-16 ENCOUNTER — Other Ambulatory Visit: Payer: Self-pay

## 2020-04-16 DIAGNOSIS — E785 Hyperlipidemia, unspecified: Secondary | ICD-10-CM | POA: Diagnosis not present

## 2020-04-16 DIAGNOSIS — N183 Chronic kidney disease, stage 3 unspecified: Secondary | ICD-10-CM

## 2020-04-16 DIAGNOSIS — I25810 Atherosclerosis of coronary artery bypass graft(s) without angina pectoris: Secondary | ICD-10-CM | POA: Diagnosis not present

## 2020-04-16 DIAGNOSIS — I10 Essential (primary) hypertension: Secondary | ICD-10-CM | POA: Diagnosis not present

## 2020-04-16 LAB — HEPATIC FUNCTION PANEL
ALT: 24 IU/L (ref 0–44)
AST: 21 IU/L (ref 0–40)
Albumin: 4.3 g/dL (ref 3.7–4.7)
Alkaline Phosphatase: 75 IU/L (ref 44–121)
Bilirubin Total: 0.8 mg/dL (ref 0.0–1.2)
Bilirubin, Direct: 0.23 mg/dL (ref 0.00–0.40)
Total Protein: 6.7 g/dL (ref 6.0–8.5)

## 2020-04-16 LAB — LIPID PANEL
Chol/HDL Ratio: 3.8 ratio (ref 0.0–5.0)
Cholesterol, Total: 155 mg/dL (ref 100–199)
HDL: 41 mg/dL (ref >39)
LDL Chol Calc (NIH): 84 mg/dL (ref 0–99)
Triglycerides: 176 mg/dL — ABNORMAL HIGH (ref 0–149)
VLDL Cholesterol Cal: 30 mg/dL (ref 5–40)

## 2020-04-19 DIAGNOSIS — J3089 Other allergic rhinitis: Secondary | ICD-10-CM | POA: Diagnosis not present

## 2020-04-19 DIAGNOSIS — J301 Allergic rhinitis due to pollen: Secondary | ICD-10-CM | POA: Diagnosis not present

## 2020-04-19 DIAGNOSIS — J3081 Allergic rhinitis due to animal (cat) (dog) hair and dander: Secondary | ICD-10-CM | POA: Diagnosis not present

## 2020-04-22 ENCOUNTER — Other Ambulatory Visit: Payer: Self-pay | Admitting: Family Medicine

## 2020-04-24 ENCOUNTER — Other Ambulatory Visit: Payer: Self-pay

## 2020-04-24 ENCOUNTER — Ambulatory Visit: Payer: PPO | Admitting: Internal Medicine

## 2020-04-24 VITALS — BP 130/70 | HR 76 | Ht 66.0 in | Wt 207.0 lb

## 2020-04-24 DIAGNOSIS — E785 Hyperlipidemia, unspecified: Secondary | ICD-10-CM

## 2020-04-24 DIAGNOSIS — M791 Myalgia, unspecified site: Secondary | ICD-10-CM | POA: Diagnosis not present

## 2020-04-24 DIAGNOSIS — T466X5D Adverse effect of antihyperlipidemic and antiarteriosclerotic drugs, subsequent encounter: Secondary | ICD-10-CM | POA: Diagnosis not present

## 2020-04-24 DIAGNOSIS — T466X5A Adverse effect of antihyperlipidemic and antiarteriosclerotic drugs, initial encounter: Secondary | ICD-10-CM

## 2020-04-24 DIAGNOSIS — I25118 Atherosclerotic heart disease of native coronary artery with other forms of angina pectoris: Secondary | ICD-10-CM

## 2020-04-24 NOTE — Progress Notes (Signed)
LIPID CLINIC NOTE  Chief Complaint:  Follow-up  Primary Care Physician: [name], MD  Primary Cardiologist:  [name], MD  HPI:  Joshua [name] is a 80 y.o. male with a history of 5 vessel bypass more than 20 years ago and recent symptoms of unstable angina with PCI to the origin to the proximal graft lesion of the circumflex in January 2021.  He also has a history of dyslipidemia, hypertension, obstructive sleep apnea, prostate cancer and bladder cancer in the past.  He has been intolerant to numerous statins and had been maintained on Repatha with significant reduction in his lipids with LDL particularly into the 80s in the past however noted over the 2 years that he was taking the medication that he had side effects which initially lasted for 24 to 48 hours then up to 72 to 96 hours after each injection.  He said it was getting to the point where he felt it was intolerable and then was advised to discontinue the medicine.  Currently on simvastatin 20 mg daily which she seems to be able to tolerate, however her lipid profile 2 months ago showed total cholesterol 215, triglycerides 240, HDL 38 and LDL 134.  He remains still well above target LDL less than 70.  This visit today discussed options to see if we can get him to target.  04/24/2020   Joshua [name] returns today for follow-up.  He is actually doing very well with recent changes in his lipid management.  He is tolerating addition of ezetimibe.  Total cholesterol now is 155, HDL 41, LDL 84 (decreased from 134) and triglycerides 176.  His target LDL is less than 70 and he is nearing that.  We discussed other possible options.  He says he is looking forward to becoming more active in the near term after having some work possibly done on his orthopedic issues.  Hopefully this will help improve his numbers further.  Since he cannot tolerate the antibody PCSK9 inhibitors, we also discussed the possibility of inclisiran.  This  is now FDA approved but has not yet made it into patients in our area.  Cost may be an issue however we are still enrolling patients in the Orion-4 trial and he may be a candidate for that.  PMHx:  Past Medical History:  Diagnosis Date  . Acute medial meniscal tear   . Anemia   . Arthritis    "middle finger right hand; right knee; neck" (02/06/2014)  . Asthma   . Bladder cancer ([place]) 04/2010   "cauterized during prostate OR"  . CAD (coronary artery disease)    CABG 2001  . Depression   . Diverticulosis   . DJD (degenerative joint disease)    BACK  . GERD (gastroesophageal reflux disease)   . H/O inguinal hernia repair 12/2018  . History of gout   . Hyperlipidemia   . Hypertension   . IBS (irritable bowel syndrome)   . Obesity   . OSA (obstructive sleep apnea)    USES CPAP   . Pancreatitis ~ 1980  . Prostate cancer ([place]) 04/2010    Past Surgical History:  Procedure Laterality Date  . ANTERIOR CERVICAL DECOMP/DISCECTOMY FUSION  05/26/2006   PLATE PLACED  . CARDIAC CATHETERIZATION  11/20/1999  . CORONARY ARTERY BYPASS GRAFT  11/20/1999   SVG-RI1-RI2, SVG-OM, SVG-dRCA  . CORONARY STENT INTERVENTION N/A 04/13/2019   Procedure: CORONARY STENT INTERVENTION;  Surgeon: Burnell Blanks, MD;  Location: [facility];  Service: Cardiovascular;  Laterality: N/A;  . INGUINAL HERNIA REPAIR Left 01/02/2019   Procedure: OPEN LEFT INGUINAL HERNIA REPAIR WITH MESH;  Surgeon: Johnathan Hausen, MD;  Location: [facility];  Service: General;  Laterality: Left;  . KNEE ARTHROSCOPY Right 1992; 04/2008  . LAPAROSCOPIC CHOLECYSTECTOMY  2007  . LEFT HEART CATH AND CORS/GRAFTS ANGIOGRAPHY N/A 04/13/2019   Procedure: LEFT HEART CATH AND CORS/GRAFTS ANGIOGRAPHY;  Surgeon: Burnell Blanks, MD;  Location: [facility];  Service: Cardiovascular;  Laterality: N/A;  . NASAL SEPTUM SURGERY Right 2007  . ROBOT ASSISTED LAPAROSCOPIC RADICAL PROSTATECTOMY  04/2010  . THYROIDECTOMY, PARTIAL   1980    FAMHx:  Family History  Problem Relation Age of Onset  . Heart disease Mother   . Hyperlipidemia Mother   . Heart disease Father   . Hyperlipidemia Father   . Diabetes Brother     SOCHx:   reports that he quit smoking about 27 years ago. His smoking use included cigarettes. He has a 35.00 pack-year smoking history. He has never used smokeless tobacco. He reports current alcohol use of about 7.0 standard drinks of alcohol per week. He reports that he does not use drugs.  ALLERGIES:  Allergies  Allergen Reactions  . 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 Bromfed Nausea And Vomiting  . Clarithromycin Other (See Comments)     gastritis  . Codeine Other (See Comments)    Trouble breathing  . Doxycycline Hyclate Nausea And Vomiting  . Modafinil Other (See Comments)     anxiety-nervousness  . Oxycodone-Acetaminophen Itching  . Promethazine Hcl Other (See Comments)     fainting  . Quinolones Nausea Only    Cipro "felt real bad"  . Telithromycin Nausea And Vomiting  . Hydrocodone-Acetaminophen Rash  . Statins Other (See Comments)    Leg cramps and makes patient feel bad. Per pt tried multiple in years past  . Sulfonamide Derivatives Rash    ROS: Pertinent items noted in HPI and remainder of comprehensive ROS otherwise negative.  HOME MEDS: Current Outpatient Medications on File Prior to Visit  Medication Sig Dispense Refill  . acetaminophen (TYLENOL) 500 MG tablet Take 1,000 mg by mouth as needed for moderate pain or headache.     . albuterol (PROAIR HFA) 108 (90 BASE) MCG/ACT inhaler Inhale 1-2 puffs into the lungs every 6 (six) hours as needed for wheezing or shortness of breath. 18 g 3  . amLODipine (NORVASC) 5 MG tablet TAKE 1 TABLET BY MOUTH EVERY DAY 90 tablet 3  . aspirin EC 81 MG tablet Take 81 mg by mouth at bedtime.     . Cholecalciferol (VITAMIN D3) 50 MCG (2000 UT) TABS Take 2,000 Units by  mouth daily.    . diazepam (VALIUM) 5 MG tablet TAKE 1 TABLET (5 MG TOTAL) BY MOUTH AT BEDTIME AS NEEDED. 30 tablet 1  . EPINEPHrine 0.3 mg/0.3 mL IJ SOAJ injection See admin instructions. for allergic reaction    . ezetimibe (ZETIA) 10 MG tablet Take 1 tablet (10 mg total) by mouth daily. 90 tablet 3  . FLOVENT HFA 110 MCG/ACT inhaler Inhale 1 puff into the lungs 2 (two) times daily.   5  . fluticasone (FLONASE) 50 MCG/ACT nasal spray Place 1 spray into both nostrils 2 (two) times daily.     . folic acid (FOLVITE) 161 MCG tablet Take 800 mcg by mouth daily.     [name]  gabapentin (NEURONTIN) 300 MG capsule Take 1 cap in AM, 1 cap at noon, 2 caps at bedtime 360 capsule 3  . Glucos-Chond-Hyal Ac-Ca Fructo (MOVE FREE JOINT HEALTH ADVANCE PO) Take by mouth.    . hydrochlorothiazide (HYDRODIURIL) 25 MG tablet TAKE 1 TABLET BY MOUTH EVERY DAY 90 tablet 2  . ketoconazole (NIZORAL) 2 % cream Apply 1 application topically 2 (two) times daily as needed for irritation.     [name] loratadine (CLARITIN) 10 MG tablet Take 10 mg by mouth at bedtime.     [name] losartan (COZAAR) 100 MG tablet TAKE 1 TABLET BY MOUTH EVERY DAY 90 tablet 3  . Miconazole Nitrate (LOTRIMIN AF DEODORANT POWDER) 2 % AERP Apply 1 spray topically 2 (two) times daily.    . nitroGLYCERIN (NITROSTAT) 0.4 MG SL tablet PLACE 1 TABLET UNDER THE TONGUE EVERY 5 (FIVE) MINUTES AS NEEDED. 75 tablet 1  . pantoprazole (PROTONIX) 40 MG tablet TAKE 1 TABLET BY MOUTH EVERY DAY 90 tablet 1  . polyethylene glycol powder (GLYCOLAX/MIRALAX) powder Take 17 g by mouth daily. (Patient taking differently: Take 17 g by mouth in the morning and at bedtime.) 255 g 0  . simvastatin (ZOCOR) 20 MG tablet TAKE 1 TABLET BY MOUTH EVERYDAY AT BEDTIME 90 tablet 3  . traMADol (ULTRAM) 50 MG tablet Take 1 tablet (50 mg total) by mouth every 6 (six) hours as needed. 20 tablet 1  . vitamin B-12 (CYANOCOBALAMIN) 1000 MCG tablet Take 1,000 mcg by mouth daily.     No current  facility-administered medications on file prior to visit.    LABS/IMAGING: No results found. However, due to the size of the patient record, not all encounters were searched. Please check Results Review for a complete set of results. No results found.  LIPID PANEL:    Component Value Date/Time   CHOL 155 04/16/2020 0845   TRIG 176 (H) 04/16/2020 0845   HDL 41 04/16/2020 0845   CHOLHDL 3.8 04/16/2020 0845   CHOLHDL 3.5 02/23/2020 0933   VLDL 31.6 11/03/2018 0806   LDLCALC 84 04/16/2020 0845   LDLCALC 84 02/23/2020 0933   LDLDIRECT 115.0 06/04/2016 0919    WEIGHTS: Wt Readings from Last 3 Encounters:  04/24/20 207 lb (93.9 kg)  04/08/20 210 lb (95.3 kg)  03/20/20 217 lb (98.4 kg)    VITALS: BP 130/70   Pulse 76   Ht 5\' 6"  (1.676 m)   Wt 207 lb (93.9 kg)   SpO2 97%   BMI 33.41 kg/m   EXAM: Deferred  EKG: Deferred  ASSESSMENT: 1. Mixed dyslipidemia, goal LDL less than 70 2. Coronary artery disease status post 5 vessel CABG (2000) 3. Recent unstable angina and PCI January 2021 4. Hypertension 5. Obstructive sleep apnea  PLAN: 1.   Joshua [name] has had improvement in his lipids on ezetimibe.  He still above target LDL less than 70 however he is doing very well with this addition.  Hopefully more physical activity will help his numbers.  If he is unable to meet target, I would next consider inclisiran. Due to cost issues, he may be a good candidate for Orion-4 and I will ask our research team to evaluate him. He is agreeable to this.   Repeat lipids and follow-up in 4-6 months.  Pixie Casino, MD, [facility], [place] Director of the Advanced Lipid Disorders &  Cardiovascular Risk Reduction Clinic Diplomate of the American Board of Clinical Lipidology Attending Cardiologist  Direct Dial: [phone]  Fax: [phone]  Website:  [email or web address] 04/24/2020, 1:38 PM

## 2020-04-24 NOTE — Patient Instructions (Signed)
Medication Instructions:  Your physician recommends that you continue on your current medications as directed. Please refer to the Current Medication list given to you today.  *If you need a refill on your cardiac medications before your next appointment, please call your pharmacy*   Lab Work: FASTING lab work in 6 months to check cholesterol   If you have labs (blood work) drawn today and your tests are completely normal, you will receive your results only by: MyChart Message (if you have MyChart) OR A paper copy in the mail If you have any lab test that is abnormal or we need to change your treatment, we will call you to review the results.   Testing/Procedures: NONE   Follow-Up: At [facility], you and your health needs are our priority.  As part of our continuing mission to provide you with exceptional heart care, we have created designated Provider Care Teams.  These Care Teams include your primary Cardiologist (physician) and Advanced Practice Providers (APPs -  Physician Assistants and Nurse Practitioners) who all work together to provide you with the care you need, when you need it.  We recommend signing up for the patient portal called "MyChart".  Sign up information is provided on this After Visit Summary.  MyChart is used to connect with patients for Virtual Visits (Telemedicine).  Patients are able to view lab/test results, encounter notes, upcoming appointments, etc.  Non-urgent messages can be sent to your provider as well.   To learn more about what you can do with MyChart, go to [email or web address].    Your next appointment:   6 month(s) - lipid clinic  The format for your next appointment:   In Person  Provider:   K. Chad Hilty, MD   Other Instructions  

## 2020-04-25 ENCOUNTER — Ambulatory Visit: Payer: Self-pay

## 2020-04-25 ENCOUNTER — Encounter: Payer: Self-pay | Admitting: Family Medicine

## 2020-04-25 ENCOUNTER — Ambulatory Visit: Payer: PPO | Admitting: Family Medicine

## 2020-04-25 VITALS — BP 110/66 | HR 79 | Ht 66.0 in | Wt 213.0 lb

## 2020-04-25 DIAGNOSIS — M79642 Pain in left hand: Secondary | ICD-10-CM | POA: Diagnosis not present

## 2020-04-25 DIAGNOSIS — M5136 Other intervertebral disc degeneration, lumbar region: Secondary | ICD-10-CM

## 2020-04-25 DIAGNOSIS — M5416 Radiculopathy, lumbar region: Secondary | ICD-10-CM | POA: Diagnosis not present

## 2020-04-25 DIAGNOSIS — M1712 Unilateral primary osteoarthritis, left knee: Secondary | ICD-10-CM | POA: Diagnosis not present

## 2020-04-25 DIAGNOSIS — M79641 Pain in right hand: Secondary | ICD-10-CM

## 2020-04-25 NOTE — Assessment & Plan Note (Signed)
Significant degenerative disc disease.  Continues to have more of a left-sided sciatica.  I do believe that an epidural would be beneficial for patient again.  This could be also contributing to some of patient's left knee pain even though I do think that that moderate to severe arthritic changes need to be concerning as well.  Patient is in agreement that he will try to have the epidural.  Patient is the primary caregiver for his wife though who did have shoulder surgery in the last month.  Patient will try to get this scheduled soon and the patient is on Plavix from cardiologist.  Per last cardiology note able to hold it for 5 days for the injection.  Follow-up with me again 4 weeks after injection.

## 2020-04-25 NOTE — Progress Notes (Signed)
[facility] Phone: [phone] Subjective:   [name], am serving as a scribe for Dr. Hulan Saas. This visit occurred during the SARS-CoV-2 public health emergency.  Safety protocols were in place, including screening questions prior to the visit, additional usage of staff PPE, and extensive cleaning of exam room while observing appropriate contact time as indicated for disinfecting solutions.   I'm seeing this patient by the request  of:  [name], MD  CC: back pain and knee pain   [id number]   03/20/2020 Severe overall and does respond well to the epidurals.  Still has to hold for another month until he is seen by cardiology and will ask about the Plavix.  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  Once again patient would like to hold on the injections unti patient gets clearance from his cardiologist for steroid.  With him sure that this will be safe otherwise.  Patient will ask at follow-up next month follow-up with me again 6 to 8 weeks continue bracing for now.Injection given today.  Avoided the steroid again until patient has approval from his cardiologist next month.  Discussed the potential for possible surgical intervention may be necessary if patient continues to have difficulty and not responding to the viscosupplementation.  Patient understands this.  Follow-up with me again in 2 to 3 months   Update 04/25/2020 KONG PACKETT is a 80 y.o. male coming in with complaint of left knee pain. Gel injection given in December. Also having bilateral hand pain. Patient states that the gel injection did not help. Unable to navigate stairs with out pain.   Left thumb feels good. Now having weakness in all fingers as well as pain. Feels like he has arthritis in all of the hand joints.  Notices pain with barometric pressure changes.   Also having constant back pain above the glutes that radiates up the back.      Monovisc injection of the knee was done on March 20, 2020    Past Medical History:  Diagnosis Date  . Acute medial meniscal tear   . Anemia   . Arthritis    "middle finger right hand; right knee; neck" (02/06/2014)  . Asthma   . Bladder cancer ([place]) 04/2010   "cauterized during prostate OR"  . CAD (coronary artery disease)    CABG 2001  . Depression   . Diverticulosis   . DJD (degenerative joint disease)    BACK  . GERD (gastroesophageal reflux disease)   . H/O inguinal hernia repair 12/2018  . History of gout   . Hyperlipidemia   . Hypertension   . IBS (irritable bowel syndrome)   . Obesity   . OSA (obstructive sleep apnea)    USES CPAP   . Pancreatitis ~ 1980  . Prostate cancer ([place]) 04/2010   Past Surgical History:  Procedure Laterality Date  . ANTERIOR CERVICAL DECOMP/DISCECTOMY FUSION  05/26/2006   PLATE PLACED  . CARDIAC CATHETERIZATION  11/20/1999  . CORONARY ARTERY BYPASS GRAFT  11/20/1999   SVG-RI1-RI2, SVG-OM, SVG-dRCA  . CORONARY STENT INTERVENTION N/A 04/13/2019   Procedure: CORONARY STENT INTERVENTION;  Surgeon: Burnell Blanks, MD;  Location: [facility];  Service: Cardiovascular;  Laterality: N/A;  . INGUINAL HERNIA REPAIR Left 01/02/2019   Procedure: OPEN LEFT  INGUINAL HERNIA REPAIR WITH MESH;  Surgeon: Johnathan Hausen, MD;  Location: [facility];  Service: General;  Laterality: Left;  . KNEE ARTHROSCOPY Right 1992; 04/2008  . LAPAROSCOPIC CHOLECYSTECTOMY  2007  . LEFT HEART CATH AND CORS/GRAFTS ANGIOGRAPHY N/A 04/13/2019   Procedure: LEFT HEART CATH AND CORS/GRAFTS ANGIOGRAPHY;  Surgeon: Burnell Blanks, MD;  Location: [facility];  Service: Cardiovascular;  Laterality: N/A;  . NASAL SEPTUM SURGERY Right 2007  . ROBOT ASSISTED LAPAROSCOPIC RADICAL PROSTATECTOMY  04/2010  . THYROIDECTOMY, PARTIAL   1980   Social History   Socioeconomic History  . Marital status: Married    Spouse name: Not on file  . Number of children: Not on file  . Years of education: Not on file  . Highest education level: Not on file  Occupational History  . Occupation: retired    Fish farm manager: RETIRED  Tobacco Use  . Smoking status: Former Smoker    Packs/day: 1.00    Years: 35.00    Pack years: 35.00    Types: Cigarettes    Quit date: 06/16/1992    Years since quitting: 27.8  . Smokeless tobacco: Never Used  Vaping Use  . Vaping Use: Never used  Substance and Sexual Activity  . Alcohol use: Yes    Alcohol/week: 7.0 standard drinks    Types: 7 Shots of liquor per week    Comment: mixed drink Q DAY  . Drug use: No  . Sexual activity: Not Currently    Partners: Female  Other Topics Concern  . Not on file  Social History Narrative   Married 42 years in 2015. No kids (mumps at age 40)      Retired from [employer] in Engineer, technical sales   Volunteers at [employer]: poker, movies and tv, staying active      Right handed   2 level home with steps he uses   Social Determinants of Radio broadcast assistant Strain: [place]   . Difficulty of Paying Living Expenses: Not hard at all  Food Insecurity: No Food Insecurity  . Worried About Charity fundraiser in the Last Year: Never true  . Ran Out of Food in the Last Year: Never true  Transportation Needs: No Transportation Needs  . Lack of Transportation (Medical): No  . Lack of Transportation (Non-Medical): No  Physical Activity: Unknown  . Days of Exercise per Week: 5 days  . Minutes of Exercise per Session: Not on file  Stress: Not on file  Social Connections: Not on file   Allergies  Allergen Reactions  . 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 Bromfed Nausea And Vomiting  . Clarithromycin Other (See Comments)     gastritis  . Codeine Other (See Comments)    Trouble breathing  .  Doxycycline Hyclate Nausea And Vomiting  . Modafinil Other (See Comments)     anxiety-nervousness  . Oxycodone-Acetaminophen Itching  . Promethazine Hcl Other (See Comments)     fainting  . Quinolones Nausea Only    Cipro "felt real bad"  . Telithromycin Nausea And Vomiting  . Hydrocodone-Acetaminophen Rash  . Statins Other (See Comments)    Leg cramps and makes patient feel bad. Per pt tried multiple in years past  . Sulfonamide Derivatives Rash   Family History  Problem Relation Age of Onset  . Heart disease Mother   . Hyperlipidemia Mother   .  Heart disease Father   . Hyperlipidemia Father   . Diabetes Brother      Current Outpatient Medications (Cardiovascular):  .  amLODipine (NORVASC) 5 MG tablet, TAKE 1 TABLET BY MOUTH EVERY DAY .  EPINEPHrine 0.3 mg/0.3 mL IJ SOAJ injection, See admin instructions. for allergic reaction .  ezetimibe (ZETIA) 10 MG tablet, Take 1 tablet (10 mg total) by mouth daily. .  hydrochlorothiazide (HYDRODIURIL) 25 MG tablet, TAKE 1 TABLET BY MOUTH EVERY DAY .  losartan (COZAAR) 100 MG tablet, TAKE 1 TABLET BY MOUTH EVERY DAY .  nitroGLYCERIN (NITROSTAT) 0.4 MG SL tablet, PLACE 1 TABLET UNDER THE TONGUE EVERY 5 (FIVE) MINUTES AS NEEDED. [name]  simvastatin (ZOCOR) 20 MG tablet, TAKE 1 TABLET BY MOUTH EVERYDAY AT BEDTIME  Current Outpatient Medications (Respiratory):  .  albuterol (PROAIR HFA) 108 (90 BASE) MCG/ACT inhaler, Inhale 1-2 puffs into the lungs every 6 (six) hours as needed for wheezing or shortness of breath. Cristy Friedlander HFA 110 MCG/ACT inhaler, Inhale 1 puff into the lungs 2 (two) times daily.  .  fluticasone (FLONASE) 50 MCG/ACT nasal spray, Place 1 spray into both nostrils 2 (two) times daily.  [name]  loratadine (CLARITIN) 10 MG tablet, Take 10 mg by mouth at bedtime.   Current Outpatient Medications (Analgesics):  .  acetaminophen (TYLENOL) 500 MG tablet, Take 1,000 mg by mouth as needed for moderate pain or headache.  [name]  aspirin EC 81 MG  tablet, Take 81 mg by mouth at bedtime.  .  traMADol (ULTRAM) 50 MG tablet, Take 1 tablet (50 mg total) by mouth every 6 (six) hours as needed.  Current Outpatient Medications (Hematological):  .  folic acid (FOLVITE) Q000111Q MCG tablet, Take 800 mcg by mouth daily.  .  vitamin B-12 (CYANOCOBALAMIN) 1000 MCG tablet, Take 1,000 mcg by mouth daily.  Current Outpatient Medications (Other):  [name]  Cholecalciferol (VITAMIN D3) 50 MCG (2000 UT) TABS, Take 2,000 Units by mouth daily. .  diazepam (VALIUM) 5 MG tablet, TAKE 1 TABLET (5 MG TOTAL) BY MOUTH AT BEDTIME AS NEEDED. [name]  gabapentin (NEURONTIN) 300 MG capsule, Take 1 cap in AM, 1 cap at noon, 2 caps at bedtime .  Glucos-Chond-Hyal Ac-Ca Fructo (MOVE FREE JOINT HEALTH ADVANCE PO), Take by mouth. [name]  ketoconazole (NIZORAL) 2 % cream, Apply 1 application topically 2 (two) times daily as needed for irritation.  .  Miconazole Nitrate (LOTRIMIN AF DEODORANT POWDER) 2 % AERP, Apply 1 spray topically 2 (two) times daily. .  pantoprazole (PROTONIX) 40 MG tablet, TAKE 1 TABLET BY MOUTH EVERY DAY .  polyethylene glycol powder (GLYCOLAX/MIRALAX) powder, Take 17 g by mouth daily. (Patient taking differently: Take 17 g by mouth in the morning and at bedtime.)   Reviewed prior external information including notes and imaging from  primary care provider As well as notes that were available from care everywhere and other healthcare systems.  Past medical history, social, surgical and family history all reviewed in electronic medical record.  No pertanent information unless stated regarding to the chief complaint.   Review of Systems:  No headache, visual changes, nausea, vomiting, diarrhea, constipation, dizziness, abdominal pain, skin rash, fevers, chills, night sweats, weight loss, swollen lymph nodes, body aches, joint swelling, chest pain, shortness of breath, mood changes. POSITIVE muscle aches  Objective  Blood pressure 110/66, pulse 79, height 5\' 6"  (1.676  m), weight 213 lb (96.6 kg), SpO2 97 %.   General: No apparent distress alert and oriented x3 mood  and affect normal, dressed appropriately.  HEENT: Pupils equal, extraocular movements intact  Respiratory: Patient's speak in full sentences and does not appear short of breath  Antalgic gait. Patient is low back exam does have loss of lordosis.  Some uncomfortable sitting in the chair but patient does have decrease in range of motion.  Tightness with straight leg test bilaterally left greater than right with some mild radicular symptoms. Left knee exam does have some instability with valgus and varus force.  Patient does have crepitus noted.  No significant swelling.   Impression and Recommendations:     The above documentation has been reviewed and is accurate and complete Lyndal Pulley, DO

## 2020-04-25 NOTE — Assessment & Plan Note (Signed)
Patient has not responded well to the conservative therapy including steroid injections as well as the viscosupplementation.  At this point we will refer patient to orthopedic surgery to discuss the potential for surgical intervention.  Patient would not want to do it any pain in the near future secondary to his wife recently having shoulder surgery.

## 2020-04-25 NOTE — Patient Instructions (Signed)
[facility] [phone] Referral to Dr. Rhona Raider for left knee replacement Once you have injection set up see me 4 weeks afterwards

## 2020-05-01 ENCOUNTER — Ambulatory Visit: Payer: PPO | Admitting: Neurology

## 2020-05-01 ENCOUNTER — Encounter: Payer: Self-pay | Admitting: Neurology

## 2020-05-01 ENCOUNTER — Other Ambulatory Visit: Payer: Self-pay

## 2020-05-01 VITALS — BP 140/72 | HR 97 | Resp 18 | Ht 66.0 in | Wt 213.0 lb

## 2020-05-01 DIAGNOSIS — G609 Hereditary and idiopathic neuropathy, unspecified: Secondary | ICD-10-CM | POA: Diagnosis not present

## 2020-05-01 MED ORDER — GABAPENTIN 300 MG PO CAPS: ORAL_CAPSULE | ORAL | 3 refills | Status: DC
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
## 2020-05-01 NOTE — Patient Instructions (Signed)
Always a pleasure to see you. Continue all your medications. Follow-up in 6-8 months, call for any changes.

## 2020-05-02 DIAGNOSIS — J301 Allergic rhinitis due to pollen: Secondary | ICD-10-CM | POA: Diagnosis not present

## 2020-05-02 DIAGNOSIS — J3089 Other allergic rhinitis: Secondary | ICD-10-CM | POA: Diagnosis not present

## 2020-05-02 DIAGNOSIS — J3081 Allergic rhinitis due to animal (cat) (dog) hair and dander: Secondary | ICD-10-CM | POA: Diagnosis not present

## 2020-05-03 ENCOUNTER — Encounter: Payer: Self-pay | Admitting: Family Medicine

## 2020-05-03 ENCOUNTER — Other Ambulatory Visit: Payer: Self-pay

## 2020-05-03 ENCOUNTER — Ambulatory Visit: Payer: PPO | Admitting: Family Medicine

## 2020-05-03 ENCOUNTER — Ambulatory Visit (INDEPENDENT_AMBULATORY_CARE_PROVIDER_SITE_OTHER): Payer: PPO | Admitting: Family Medicine

## 2020-05-03 VITALS — BP 126/75 | HR 72 | Temp 97.7°F | Ht 66.75 in | Wt 217.2 lb

## 2020-05-03 DIAGNOSIS — N5082 Scrotal pain: Secondary | ICD-10-CM

## 2020-05-03 NOTE — Progress Notes (Signed)
   Joshua [name] is a 80 y.o. male who presents today for an office visit.  Assessment/Plan:  Testicular Pain Possibly nerve impingement secondary to scar tissue from his open hernia repair given neuropathic type pain.  Also has a history of degenerative disc disease which could be contributing.  Given relatively acute onset we will check ultrasound to rule out any testicular masses, hernia recurrence, or other possible etiologies.  Symptoms are currently manageable but he has tramadol to take as needed for severe pain.  Discussed reasons to return to care.  Follow-up as needed.    Subjective:  HPI:  Patient here with testicular/scrotal pain for the past few weeks.  He underwent hernia repair in October 2020.  He has some postoperative pain.  He has been moving a lot of furniture recently and is concerned about possibly reaggravating the hernia.  Pain has been persistent.  No obvious aggravating or alleviating factors.  Described as a burning sensation.  Located on posterior aspect of scrotum.  Also in bilateral testicles.      Objective:  Physical Exam: BP 126/75   Pulse 72   Temp 97.7 F (36.5 C)   Ht 5' 6.75" (1.695 m)   Wt 217 lb 3.2 oz (98.5 kg)   SpO2 95%   BMI 34.27 kg/m   Gen: No acute distress, resting comfortably CV: Regular rate and rhythm with no murmurs appreciated Pulm: Normal work of breathing, clear to auscultation bilaterally with no crackles, wheezes, or rhonchi GU: Normal male genitalia.  Nontender to palpation.  No inguinal masses or lumps noted. Neuro: Grossly normal, moves all extremities Psych: Normal affect and thought content      Libbie Bartley M. Jerline Pain, MD 05/03/2020 3:27 PM

## 2020-05-03 NOTE — Patient Instructions (Signed)
It was very nice to see you today!  We will get an ultrasound.  It is possible that she could have a pinched nerve but we need to make sure there is nothing else is going on.  Please let us know if your pain is worsening.  Take care, Dr Jerline Pain  Please try these tips to maintain a healthy lifestyle:   Eat at least 3 REAL meals and 1-2 snacks per day.  Aim for no more than 5 hours between eating.  If you eat breakfast, please do so within one hour of getting up.    Each meal should contain half fruits/vegetables, one quarter protein, and one quarter carbs (no bigger than a computer mouse)   Cut down on sweet beverages. This includes juice, soda, and sweet tea.     Drink at least 1 glass of water with each meal and aim for at least 8 glasses per day   Exercise at least 150 minutes every week.

## 2020-05-05 ENCOUNTER — Encounter: Payer: Self-pay | Admitting: Family Medicine

## 2020-05-06 ENCOUNTER — Ambulatory Visit (INDEPENDENT_AMBULATORY_CARE_PROVIDER_SITE_OTHER): Payer: PPO

## 2020-05-06 ENCOUNTER — Telehealth: Payer: Self-pay

## 2020-05-06 DIAGNOSIS — Z0000 Encounter for general adult medical examination without abnormal findings: Secondary | ICD-10-CM

## 2020-05-06 NOTE — Patient Instructions (Addendum)
Mr. Joshua [name] , Thank you for taking time to come for your Medicare Wellness Visit. I appreciate your ongoing commitment to your health goals. Please review the following plan we discussed and let me know if I can assist you in the future.   Screening recommendations/referrals: Colonoscopy: No longer required  Recommended yearly ophthalmology/optometry visit for glaucoma screening and checkup Recommended yearly dental visit for hygiene and checkup  Vaccinations: Influenza vaccine: Done 01/23/20 Pneumococcal vaccine: Up to date Tdap vaccine: Up to date Shingles vaccine: Completed 01/31/18 & 04/02/18   Covid-19: Completed 2/5, 3/2 , 9/30, & 01/23/20  Advanced directives: Copies in chart  Conditions/risks identified: Get rid of the pain everyday  Next appointment: Follow up in one year for your annual wellness visit.    Preventive Care 44 Years and Older, Male Preventive care refers to lifestyle choices and visits with your health care provider that can promote health and wellness. What does preventive care include?  A yearly physical exam. This is also called an annual well check.  Dental exams once or twice a year.  Routine eye exams. Ask your health care provider how often you should have your eyes checked.  Personal lifestyle choices, including:  Daily care of your teeth and gums.  Regular physical activity.  Eating a healthy diet.  Avoiding tobacco and drug use.  Limiting alcohol use.  Practicing safe sex.  Taking low doses of aspirin every day.  Taking vitamin and mineral supplements as recommended by your health care provider. What happens during an annual well check? The services and screenings done by your health care provider during your annual well check will depend on your age, overall health, lifestyle risk factors, and family history of disease. Counseling  Your health care provider may ask you questions about your:  Alcohol use.  Tobacco use.  Drug  use.  Emotional well-being.  Home and relationship well-being.  Sexual activity.  Eating habits.  History of falls.  Memory and ability to understand (cognition).  Work and work Statistician. Screening  You may have the following tests or measurements:  Height, weight, and BMI.  Blood pressure.  Lipid and cholesterol levels. These may be checked every 5 years, or more frequently if you are over 64 years old.  Skin check.  Lung cancer screening. You may have this screening every year starting at age 56 if you have a 30-pack-year history of smoking and currently smoke or have quit within the past 15 years.  Fecal occult blood test (FOBT) of the stool. You may have this test every year starting at age 62.  Flexible sigmoidoscopy or colonoscopy. You may have a sigmoidoscopy every 5 years or a colonoscopy every 10 years starting at age 37.  Prostate cancer screening. Recommendations will vary depending on your family history and other risks.  Hepatitis C blood test.  Hepatitis B blood test.  Sexually transmitted disease (STD) testing.  Diabetes screening. This is done by checking your blood sugar (glucose) after you have not eaten for a while (fasting). You may have this done every 1-3 years.  Abdominal aortic aneurysm (AAA) screening. You may need this if you are a current or former smoker.  Osteoporosis. You may be screened starting at age 41 if you are at high risk. Talk with your health care provider about your test results, treatment options, and if necessary, the need for more tests. Vaccines  Your health care provider may recommend certain vaccines, such as:  Influenza vaccine. This is recommended every  year.  Tetanus, diphtheria, and acellular pertussis (Tdap, Td) vaccine. You may need a Td booster every 10 years.  Zoster vaccine. You may need this after age 61.  Pneumococcal 13-valent conjugate (PCV13) vaccine. One dose is recommended after age  11.  Pneumococcal polysaccharide (PPSV23) vaccine. One dose is recommended after age 51. Talk to your health care provider about which screenings and vaccines you need and how often you need them. This information is not intended to replace advice given to you by your health care provider. Make sure you discuss any questions you have with your health care provider. Document Released: 04/05/2015 Document Revised: 11/27/2015 Document Reviewed: 01/08/2015 Elsevier Interactive Patient Education  2017 [place] Prevention in the Home Falls can cause injuries. They can happen to people of all ages. There are many things you can do to make your home safe and to help prevent falls. What can I do on the outside of my home?  Regularly fix the edges of walkways and driveways and fix any cracks.  Remove anything that might make you trip as you walk through a door, such as a raised step or threshold.  Trim any bushes or trees on the path to your home.  Use bright outdoor lighting.  Clear any walking paths of anything that might make someone trip, such as rocks or tools.  Regularly check to see if handrails are loose or broken. Make sure that both sides of any steps have handrails.  Any raised decks and porches should have guardrails on the edges.  Have any leaves, snow, or ice cleared regularly.  Use sand or salt on walking paths during winter.  Clean up any spills in your garage right away. This includes oil or grease spills. What can I do in the bathroom?  Use night lights.  Install grab bars by the toilet and in the tub and shower. Do not use towel bars as grab bars.  Use non-skid mats or decals in the tub or shower.  If you need to sit down in the shower, use a plastic, non-slip stool.  Keep the floor dry. Clean up any water that spills on the floor as soon as it happens.  Remove soap buildup in the tub or shower regularly.  Attach bath mats securely with double-sided  non-slip rug tape.  Do not have throw rugs and other things on the floor that can make you trip. What can I do in the bedroom?  Use night lights.  Make sure that you have a light by your bed that is easy to reach.  Do not use any sheets or blankets that are too big for your bed. They should not hang down onto the floor.  Have a firm chair that has side arms. You can use this for support while you get dressed.  Do not have throw rugs and other things on the floor that can make you trip. What can I do in the kitchen?  Clean up any spills right away.  Avoid walking on wet floors.  Keep items that you use a lot in easy-to-reach places.  If you need to reach something above you, use a strong step stool that has a grab bar.  Keep electrical cords out of the way.  Do not use floor polish or wax that makes floors slippery. If you must use wax, use non-skid floor wax.  Do not have throw rugs and other things on the floor that can make you trip. What can  I do with my stairs?  Do not leave any items on the stairs.  Make sure that there are handrails on both sides of the stairs and use them. Fix handrails that are broken or loose. Make sure that handrails are as long as the stairways.  Check any carpeting to make sure that it is firmly attached to the stairs. Fix any carpet that is loose or worn.  Avoid having throw rugs at the top or bottom of the stairs. If you do have throw rugs, attach them to the floor with carpet tape.  Make sure that you have a light switch at the top of the stairs and the bottom of the stairs. If you do not have them, ask someone to add them for you. What else can I do to help prevent falls?  Wear shoes that:  Do not have high heels.  Have rubber bottoms.  Are comfortable and fit you well.  Are closed at the toe. Do not wear sandals.  If you use a stepladder:  Make sure that it is fully opened. Do not climb a closed stepladder.  Make sure that both  sides of the stepladder are locked into place.  Ask someone to hold it for you, if possible.  Clearly mark and make sure that you can see:  Any grab bars or handrails.  First and last steps.  Where the edge of each step is.  Use tools that help you move around (mobility aids) if they are needed. These include:  Canes.  Walkers.  Scooters.  Crutches.  Turn on the lights when you go into a dark area. Replace any light bulbs as soon as they burn out.  Set up your furniture so you have a clear path. Avoid moving your furniture around.  If any of your floors are uneven, fix them.  If there are any pets around you, be aware of where they are.  Review your medicines with your doctor. Some medicines can make you feel dizzy. This can increase your chance of falling. Ask your doctor what other things that you can do to help prevent falls. This information is not intended to replace advice given to you by your health care provider. Make sure you discuss any questions you have with your health care provider. Document Released: 01/03/2009 Document Revised: 08/15/2015 Document Reviewed: 04/13/2014 Elsevier Interactive Patient Education  2017 [employer].

## 2020-05-06 NOTE — Progress Notes (Signed)
Virtual Visit via Telephone Note  I connected with  Joshua [name] on 05/06/20 at 11:45 AM EST by telephone and verified that I am speaking with the correct person using two identifiers.  Medicare Annual Wellness visit completed telephonically due to Covid-19 pandemic.   Persons participating in this call: This Health Coach and this patient.   Location: Patient: Home Provider: Office   I discussed the limitations, risks, security and privacy concerns of performing an evaluation and management service by telephone and the availability of in person appointments. The patient expressed understanding and agreed to proceed.  Unable to perform video visit due to video visit attempted and failed and/or patient does not have video capability.   Some vital signs may be absent or patient reported.   Willette Brace, LPN    Subjective:   Joshua [name] is a 80 y.o. male who presents for Medicare Annual/Subsequent preventive examination.  Review of Systems     Cardiac Risk Factors include: advanced age (>69men, >35 women);hypertension;male gender;dyslipidemia;obesity (BMI >30kg/m2)     Objective:    Today's Vitals   05/06/20 1149  PainSc: 7    There is no height or weight on file to calculate BMI.  Advanced Directives 05/06/2020 05/01/2020 10/03/2019 05/04/2019 04/13/2019 02/06/2019 01/02/2019  Does Patient Have a Medical Advance Directive? Yes Yes Yes No No Yes Yes  Type of [employer]maker of [place];Living will - - Living will;Healthcare Power of [place];Living will  Does patient want to make changes to medical advance directive? - - - - - No - Patient declined No - Patient declined  Copy of [place] in Chart? Yes - validated most recent copy scanned in chart (See row information) - - - - No - copy requested No - copy requested  Would patient like information on creating a medical  advance directive? - - - Yes (MAU/Ambulatory/Procedural Areas - Information given) Yes (MAU/Ambulatory/Procedural Areas - Information given) - -    Current Medications (verified) Outpatient Encounter Medications as of 05/06/2020  Medication Sig  . acetaminophen (TYLENOL) 500 MG tablet Take 1,000 mg by mouth as needed for moderate pain or headache.   . albuterol (PROAIR HFA) 108 (90 BASE) MCG/ACT inhaler Inhale 1-2 puffs into the lungs every 6 (six) hours as needed for wheezing or shortness of breath.  [name] amLODipine (NORVASC) 5 MG tablet TAKE 1 TABLET BY MOUTH EVERY DAY  . aspirin EC 81 MG tablet Take 81 mg by mouth at bedtime.   . Cholecalciferol (VITAMIN D3) 50 MCG (2000 UT) TABS Take 2,000 Units by mouth daily.  . diazepam (VALIUM) 5 MG tablet TAKE 1 TABLET (5 MG TOTAL) BY MOUTH AT BEDTIME AS NEEDED.  [name] EPINEPHrine 0.3 mg/0.3 mL IJ SOAJ injection See admin instructions. for allergic reaction  . ezetimibe (ZETIA) 10 MG tablet Take 1 tablet (10 mg total) by mouth daily.  [name] FLOVENT HFA 110 MCG/ACT inhaler Inhale 1 puff into the lungs 2 (two) times daily.   . fluticasone (FLONASE) 50 MCG/ACT nasal spray Place 1 spray into both nostrils 2 (two) times daily.   . folic acid (FOLVITE) 382 MCG tablet Take 800 mcg by mouth daily.   [name] gabapentin (NEURONTIN) 300 MG capsule Take 1 cap in AM, 1 cap at noon, 2 caps at bedtime  . Glucos-Chond-Hyal Ac-Ca Fructo (MOVE FREE JOINT HEALTH ADVANCE PO) Take by mouth.  . hydrochlorothiazide (HYDRODIURIL) 25 MG tablet TAKE 1  TABLET BY MOUTH EVERY DAY  . ketoconazole (NIZORAL) 2 % cream Apply 1 application topically 2 (two) times daily as needed for irritation.   [name] loratadine (CLARITIN) 10 MG tablet Take 10 mg by mouth at bedtime.   [name] losartan (COZAAR) 100 MG tablet TAKE 1 TABLET BY MOUTH EVERY DAY  . Miconazole Nitrate (LOTRIMIN AF DEODORANT POWDER) 2 % AERP Apply 1 spray topically 2 (two) times daily.  . nitroGLYCERIN (NITROSTAT) 0.4 MG SL tablet PLACE 1 TABLET  UNDER THE TONGUE EVERY 5 (FIVE) MINUTES AS NEEDED.  [name] pantoprazole (PROTONIX) 40 MG tablet TAKE 1 TABLET BY MOUTH EVERY DAY  . polyethylene glycol powder (GLYCOLAX/MIRALAX) powder Take 17 g by mouth daily. (Patient taking differently: Take 17 g by mouth in the morning and at bedtime.)  . simvastatin (ZOCOR) 20 MG tablet TAKE 1 TABLET BY MOUTH EVERYDAY AT BEDTIME  . traMADol (ULTRAM) 50 MG tablet Take 1 tablet (50 mg total) by mouth every 6 (six) hours as needed.  . vitamin B-12 (CYANOCOBALAMIN) 1000 MCG tablet Take 1,000 mcg by mouth daily.   No facility-administered encounter medications on file as of 05/06/2020.    Allergies (verified) Benadryl [diphenhydramine], Bromfed, Clarithromycin, Codeine, Doxycycline hyclate, Modafinil, Oxycodone-acetaminophen, Promethazine hcl, Quinolones, Telithromycin, Hydrocodone-acetaminophen, Statins, and Sulfonamide derivatives   History: Past Medical History:  Diagnosis Date  . Acute medial meniscal tear   . Anemia   . Arthritis    "middle finger right hand; right knee; neck" (02/06/2014)  . Asthma   . Bladder cancer ([place]) 04/2010   "cauterized during prostate OR"  . CAD (coronary artery disease)    CABG 2001  . Depression   . Diverticulosis   . DJD (degenerative joint disease)    BACK  . GERD (gastroesophageal reflux disease)   . H/O inguinal hernia repair 12/2018  . History of gout   . Hyperlipidemia   . Hypertension   . IBS (irritable bowel syndrome)   . Obesity   . OSA (obstructive sleep apnea)    USES CPAP   . Pancreatitis ~ 1980  . Prostate cancer ([place]) 04/2010   Past Surgical History:  Procedure Laterality Date  . ANTERIOR CERVICAL DECOMP/DISCECTOMY FUSION  05/26/2006   PLATE PLACED  . CARDIAC CATHETERIZATION  11/20/1999  . CORONARY ARTERY BYPASS GRAFT  11/20/1999   SVG-RI1-RI2, SVG-OM, SVG-dRCA  . CORONARY STENT INTERVENTION N/A 04/13/2019   Procedure: CORONARY STENT INTERVENTION;  Surgeon: Burnell Blanks, MD;  Location:  [facility];  Service: Cardiovascular;  Laterality: N/A;  . INGUINAL HERNIA REPAIR Left 01/02/2019   Procedure: OPEN LEFT INGUINAL HERNIA REPAIR WITH MESH;  Surgeon: Johnathan Hausen, MD;  Location: [facility];  Service: General;  Laterality: Left;  . KNEE ARTHROSCOPY Right 1992; 04/2008  . LAPAROSCOPIC CHOLECYSTECTOMY  2007  . LEFT HEART CATH AND CORS/GRAFTS ANGIOGRAPHY N/A 04/13/2019   Procedure: LEFT HEART CATH AND CORS/GRAFTS ANGIOGRAPHY;  Surgeon: Burnell Blanks, MD;  Location: [facility];  Service: Cardiovascular;  Laterality: N/A;  . NASAL SEPTUM SURGERY Right 2007  . ROBOT ASSISTED LAPAROSCOPIC RADICAL PROSTATECTOMY  04/2010  . THYROIDECTOMY, PARTIAL  1980   Family History  Problem Relation Age of Onset  . Heart disease Mother   . Hyperlipidemia Mother   . Heart disease Father   . Hyperlipidemia Father   . Diabetes Brother    Social History   Socioeconomic History  . Marital status: Married    Spouse name: Not on file  . Number of children: Not  on file  . Years of education: Not on file  . Highest education level: Not on file  Occupational History  . Occupation: retired    Fish farm manager: RETIRED  Tobacco Use  . Smoking status: Former Smoker    Packs/day: 1.00    Years: 35.00    Pack years: 35.00    Types: Cigarettes    Quit date: 06/16/1992    Years since quitting: 27.9  . Smokeless tobacco: Never Used  Vaping Use  . Vaping Use: Never used  Substance and Sexual Activity  . Alcohol use: Yes    Alcohol/week: 7.0 standard drinks    Types: 7 Shots of liquor per week    Comment: mixed drink Q DAY  . Drug use: No  . Sexual activity: Not Currently    Partners: Female  Other Topics Concern  . Not on file  Social History Narrative   Married 42 years in 2015. No kids (mumps at age 46)      Retired from [employer] in Engineer, technical sales   Volunteers at [place]: poker, movies and tv, staying active      Right handed   2 level home with steps he uses    Social Determinants of Radio broadcast assistant Strain: [place]   . Difficulty of Paying Living Expenses: Not hard at all  Food Insecurity: No Food Insecurity  . Worried About Charity fundraiser in the Last Year: Never true  . Ran Out of Food in the Last Year: Never true  Transportation Needs: No Transportation Needs  . Lack of Transportation (Medical): No  . Lack of Transportation (Non-Medical): No  Physical Activity: Inactive  . Days of Exercise per Week: 0 days  . Minutes of Exercise per Session: 0 min  Stress: No Stress Concern Present  . Feeling of Stress : Only a little  Social Connections: Moderately Isolated  . Frequency of Communication with Friends and Family: More than three times a week  . Frequency of Social Gatherings with Friends and Family: Three times a week  . Attends Religious Services: Never  . Active Member of Clubs or Organizations: No  . Attends Archivist Meetings: Never  . Marital Status: Married    Tobacco Counseling Counseling given: Not Answered   Clinical Intake:  Pre-visit preparation completed: Yes  Pain : 0-10 (7) Pain Score: 7  Pain Type: Chronic pain Pain Location: Generalized (knee feet bak and scrotum and left hand) Pain Descriptors / Indicators: Aching,Burning Pain Onset: More than a month ago Pain Frequency: Intermittent     BMI - recorded: 34.27 Nutritional Status: BMI > 30  Obese Nutritional Risks: None Diabetes: No  How often do you need to have someone help you when you read instructions, pamphlets, or other written materials from your doctor or pharmacy?: 1 - Never  Diabetic?No  Interpreter Needed?: No  Information entered by :: Charlott Rakes, LPN   Activities of Daily Living In your present state of health, do you have any difficulty performing the following activities: 05/06/2020 02/23/2020  Hearing? Y N  Comment wears hearing aids -  Vision? N Y  Difficulty concentrating or making decisions? Y  N  Comment memory at times -  Walking or climbing stairs? Y N  Dressing or bathing? N N  Doing errands, shopping? N N  Preparing Food and eating ? N -  Using the Toilet? N -  In the past six months, have you accidently leaked urine?  Y -  Comment drip at times -  Do you have problems with loss of bowel control? N -  Managing your Medications? N -  Managing your Finances? N -  Housekeeping or managing your Housekeeping? N -  Some recent data might be hidden    Patient Care Team: [name], MD as PCP - General (Family Medicine) [name], MD as PCP - Cardiology (Cardiology) Gerda Diss, DO as Consulting Physician (Sports Medicine) Cameron Sprang, MD as Consulting Physician (Neurology) Chesley Mires, MD as Consulting Physician (Pulmonary Disease) Lyndal Pulley, DO as Consulting Physician (Sports Medicine) Tiajuana Amass, MD as Consulting Physician (Allergy and Immunology) Madelin Rear, [facility] (Pharmacist) Madelin Rear, [facility] as Pharmacist (Pharmacist)  Indicate any recent Medical Services you may have received from other than [facility] providers in the past year (date may be approximate).     Assessment:   This is a routine wellness examination for Joshua [name].  Hearing/Vision screen  Hearing Screening   125Hz  250Hz  500Hz  1000Hz  2000Hz  3000Hz  4000Hz  6000Hz  8000Hz   Right ear:           Left ear:           Comments: Hearing aids   Vision Screening Comments: Pt follows Dr Prudencio Burly for annual eye exams  Dietary issues and exercise activities discussed: Current Exercise Habits: The patient does not participate in regular exercise at present  Goals    . Exercise 150 minutes per week (moderate activity)     To get back to the fitness center  \    . Patient Stated     To get rid of this pain     . PharmD Care Plan     CARE PLAN ENTRY (see longitudinal plan of care for additional care plan information)  Current Barriers:  . Chronic Disease Management support, education,  and care coordination needs related to Hypertension and Hyperlipidemia   Hypertension BP Readings from Last 3 Encounters:  03/20/20 118/82  02/23/20 126/70  02/09/20 139/77   . Pharmacist Clinical Goal(s): o Over the next 365 days, patient will work with PharmD and providers to maintain BP goal <130/80 . Current regimen:  o Losartan 100 mg daily  o Amlodipine 5 mg once daily o HCTZ 25 mg once daily . Interventions: o Reviewed diet and exercise - Maintain a healthy weight and exercise regularly, as directed by your health care provider. Eat healthy foods, such as: Lean proteins, complex carbohydrates, fresh fruits and vegetables, low-fat dairy products, healthy fats. . Patient self care activities - Over the next 365 days, patient will: o Check BP at least once every 1-2 weeks, document, and provide at future appointments o Ensure daily salt intake < 2300 mg/day  Hyperlipidemia Lab Results  Component Value Date/Time   LDLCALC 84 02/23/2020 09:33 AM   LDLDIRECT 115.0 06/04/2016 09:19 AM   . Pharmacist Clinical Goal(s): o Over the next 180 days, patient will work with PharmD and providers to achieve LDL goal < 70 . Current regimen:  o Simvastatin 20 mg o Zetia 10 mg once daily . Interventions: o Reviewed alternative cholesterol medications - offered patient assistance support -  . Patient self care activities - Over the next 90 days, patient will: o Let pharmacist know if there are any questions or financial concerns   Medication management . Pharmacist Clinical Goal(s): o Over the next 90 days, patient will work with PharmD and providers to achieve optimal medication adherence . Current pharmacy: [facility] . Interventions  o Comprehensive medication review performed. o Continue current medication management strategy. o Discussed pharmacy services through [facility]. . Patient self care activities - Over the next 90 days, patient will: o Focus on medication  adherence by taking medications as prescribed o Report any questions or concerns to PharmD and/or provider(s) Initial goal documentation.    . Weight (lb) < 190 lb (86.2 kg)     Keep cutting back on portions Keep exercising       Depression Screen PHQ 2/9 Scores 05/06/2020 02/23/2020 12/25/2019 09/04/2019 08/25/2019 06/30/2019 05/04/2019  PHQ - 2 Score 0 1 0 3 4 1 1   PHQ- 9 Score - 8 1 7 10  - -    Fall Risk Fall Risk  05/06/2020 05/01/2020 02/23/2020 10/03/2019 09/04/2019  Falls in the past year? 1 0 1 0 0  Comment - - - - -  Number falls in past yr: 1 0 0 0 0  Injury with Fall? 0 0 0 0 0  Risk for fall due to : Impaired vision;Impaired balance/gait;Impaired mobility - - Impaired balance/gait History of fall(s)  Risk for fall due to: Comment - - - - -  Follow up Falls prevention discussed - - - -    FALL RISK PREVENTION PERTAINING TO THE HOME:  Any stairs in or around the home? Yes  If so, are there any without handrails? No  Home free of loose throw rugs in walkways, pet beds, electrical cords, etc? Yes  Adequate lighting in your home to reduce risk of falls? Yes   ASSISTIVE DEVICES UTILIZED TO PREVENT FALLS:  Life alert? No  Use of a cane, walker or w/c? No  Grab bars in the bathroom? Yes  Shower chair or bench in shower? Yes  Elevated toilet seat or a handicapped toilet? No   TIMED UP AND GO:  Was the test performed? No .    Cognitive Function: MMSE - Mini Mental State Exam 08/05/2017 05/27/2016  Not completed: (No Data) (No Data)   Montreal Cognitive Assessment  09/25/2014  Visuospatial/ Executive (0/5) 5  Naming (0/3) 3  Attention: Read list of digits (0/2) 2  Attention: Read list of letters (0/1) 1  Attention: Serial 7 subtraction starting at 100 (0/3) 3  Language: Repeat phrase (0/2) 2  Language : Fluency (0/1) 1  Abstraction (0/2) 2  Delayed Recall (0/5) 3  Orientation (0/6) 6  Total 28  Adjusted Score (based on education) 28   6CIT Screen 05/06/2020 02/06/2019   What Year? 0 points 0 points  What month? 0 points 0 points  What time? - 0 points  Count back from 20 4 points 0 points  Months in reverse 0 points 0 points  Repeat phrase 2 points 0 points  Total Score - 0    Immunizations Immunization History  Administered Date(s) Administered  . Fluad Quad(high Dose 65+) 11/21/2018  . Influenza Split 01/08/2012, 11/12/2014  . Influenza Whole 12/08/2007, 12/26/2008, 12/22/2010  . Influenza, High Dose Seasonal PF 11/16/2015, 01/02/2016, 01/01/2017, 01/06/2018, 02/01/2019, 11/24/2019, 01/23/2020  . Influenza,inj,Quad PF,6+ Mos 11/30/2012  . Influenza-Unspecified 01/12/2014, 12/21/2017  . PFIZER(Purple Top)SARS-COV-2 Vaccination 04/28/2019, 05/23/2019, 12/21/2019, 01/23/2020  . Pneumococcal Conjugate-13 05/31/2014  . Pneumococcal Polysaccharide-23 06/04/2006, 07/15/2012, 01/02/2016, 12/31/2016, 01/23/2020  . Td 04/23/2005  . Tdap 02/18/2016  . Zoster 01/08/2012  . Zoster Recombinat (Shingrix) 01/31/2018, 04/02/2018    TDAP status: Up to date  Flu Vaccine status: Up to date  Done 11/24/19 Pneumococcal vaccine status: Up to date  Covid-19 vaccine status:  Completed vaccines  Qualifies for Shingles Vaccine? Yes   Zostavax completed Yes   Shingrix Completed?: Yes  Screening Tests Health Maintenance  Topic Date Due  . TETANUS/TDAP  02/17/2026  . INFLUENZA VACCINE  Completed  . COVID-19 Vaccine  Completed  . Hepatitis C Screening  Completed  . PNA vac Low Risk Adult  Completed    Health Maintenance  There are no preventive care reminders to display for this patient.  Colorectal cancer screening: No longer required.    Additional Screening:  Hepatitis C Screening:  Completed 09/04/19  Vision Screening: Recommended annual ophthalmology exams for early detection of glaucoma and other disorders of the eye. Is the patient up to date with their annual eye exam?  Yes  Who is the provider or what is the name of the office in which the  patient attends annual eye exams? Dr Prudencio Burly If pt is not established with a provider, would they like to be referred to a provider to establish care? No .   Dental Screening: Recommended annual dental exams for proper oral hygiene  Community Resource Referral / Chronic Care Management: CRR required this visit?  No   CCM required this visit?  No      Plan:     I have personally reviewed and noted the following in the patient's chart:   . Medical and social history . Use of alcohol, tobacco or illicit drugs  . Current medications and supplements . Functional ability and status . Nutritional status . Physical activity . Advanced directives . List of other physicians . Hospitalizations, surgeries, and ER visits in previous 12 months . Vitals . Screenings to include cognitive, depression, and falls . Referrals and appointments  In addition, I have reviewed and discussed with patient certain preventive protocols, quality metrics, and best practice recommendations. A written personalized care plan for preventive services as well as general preventive health recommendations were provided to patient.     Willette Brace, LPN   7/62/8315   Nurse Notes: Pt was concerned about taking HCTZ related to when he missed a dose he didn't have any dipping or irregular stream of urine. Would this be an option to stop this medication or is it needed for Blood pressure as well. He would like to put it on hold for a few day's to monitor urine stream  Please advise

## 2020-05-06 NOTE — Telephone Encounter (Signed)
err

## 2020-05-07 ENCOUNTER — Other Ambulatory Visit: Payer: Self-pay

## 2020-05-07 ENCOUNTER — Ambulatory Visit
Admission: RE | Admit: 2020-05-07 | Discharge: 2020-05-07 | Disposition: A | Discharge status: Home / Self Care | Payer: PPO | Source: Ambulatory Visit | Attending: Family Medicine | Admitting: Family Medicine

## 2020-05-07 DIAGNOSIS — M47817 Spondylosis without myelopathy or radiculopathy, lumbosacral region: Secondary | ICD-10-CM | POA: Diagnosis not present

## 2020-05-07 DIAGNOSIS — M5416 Radiculopathy, lumbar region: Secondary | ICD-10-CM

## 2020-05-07 MED ORDER — IOPAMIDOL (ISOVUE-M 200) INJECTION 41%
1.0000 mL | Freq: Once | INTRAMUSCULAR | Status: AC
  Administered 2020-05-07: 1 mL via EPIDURAL

## 2020-05-07 MED ORDER — METHYLPREDNISOLONE ACETATE 40 MG/ML INJ SUSP (RADIOLOG
120.0000 mg | Freq: Once | INTRAMUSCULAR | Status: AC
  Administered 2020-05-07: 120 mg via EPIDURAL

## 2020-05-07 NOTE — Discharge Instructions (Signed)

## 2020-05-09 DIAGNOSIS — G4733 Obstructive sleep apnea (adult) (pediatric): Secondary | ICD-10-CM | POA: Diagnosis not present

## 2020-05-15 DIAGNOSIS — M1712 Unilateral primary osteoarthritis, left knee: Secondary | ICD-10-CM | POA: Diagnosis not present

## 2020-05-15 DIAGNOSIS — M47816 Spondylosis without myelopathy or radiculopathy, lumbar region: Secondary | ICD-10-CM | POA: Diagnosis not present

## 2020-05-16 ENCOUNTER — Ambulatory Visit
Admission: RE | Admit: 2020-05-16 | Discharge: 2020-05-16 | Disposition: A | Discharge status: Home / Self Care | Payer: PPO | Source: Ambulatory Visit | Attending: Family Medicine | Admitting: Family Medicine

## 2020-05-16 ENCOUNTER — Other Ambulatory Visit: Payer: Self-pay

## 2020-05-16 ENCOUNTER — Other Ambulatory Visit: Payer: PPO

## 2020-05-16 DIAGNOSIS — J3089 Other allergic rhinitis: Secondary | ICD-10-CM | POA: Diagnosis not present

## 2020-05-16 DIAGNOSIS — J3081 Allergic rhinitis due to animal (cat) (dog) hair and dander: Secondary | ICD-10-CM | POA: Diagnosis not present

## 2020-05-16 DIAGNOSIS — N433 Hydrocele, unspecified: Secondary | ICD-10-CM | POA: Diagnosis not present

## 2020-05-16 DIAGNOSIS — N5082 Scrotal pain: Secondary | ICD-10-CM

## 2020-05-16 DIAGNOSIS — I861 Scrotal varices: Secondary | ICD-10-CM | POA: Diagnosis not present

## 2020-05-16 DIAGNOSIS — N503 Cyst of epididymis: Secondary | ICD-10-CM | POA: Diagnosis not present

## 2020-05-16 DIAGNOSIS — J301 Allergic rhinitis due to pollen: Secondary | ICD-10-CM | POA: Diagnosis not present

## 2020-05-18 ENCOUNTER — Encounter: Payer: Self-pay | Admitting: Family Medicine

## 2020-05-20 ENCOUNTER — Other Ambulatory Visit: Payer: Self-pay | Admitting: Orthopaedic Surgery

## 2020-05-20 ENCOUNTER — Telehealth: Payer: Self-pay | Admitting: Internal Medicine

## 2020-05-20 DIAGNOSIS — Z01818 Encounter for other preprocedural examination: Secondary | ICD-10-CM

## 2020-05-20 NOTE — Progress Notes (Signed)
Please inform patient of the following:  He has a varicocele which could explain some of his symptoms. This is benign but I recommend referral to urology for further evaluation and to discuss management.  Algis Greenhouse. Jerline Pain, MD 05/20/2020 9:52 AM

## 2020-05-20 NOTE — Telephone Encounter (Signed)
   [facility] Pre-operative Risk Assessment    HEARTCARE STAFF: - Please ensure there is not already an duplicate clearance open for this procedure. - Under Visit Info/Reason for Call, type in Other and utilize the format Clearance MM/DD/YY or Clearance TBD. Do not use dashes or single digits. - If request is for dental extraction, please clarify the # of teeth to be extracted.  Request for surgical clearance:  1. What type of surgery is being performed? Right knee replacement   2. When is this surgery scheduled? 06/18/2020  3. What type of clearance is required (medical clearance vs. Pharmacy clearance to hold med vs. Both)? Both  4. Are there any medications that need to be held prior to surgery and how long? Asprin as far as they know.  5. Practice name and name of physician performing surgery? [facility]  6. What is the office phone number? [phone]   7.   What is the office fax number? [phone]  8.   Anesthesia type (None, local, MAC, general) ? spinal   Joshua [name] 05/20/2020, 3:50 PM  _________________________________________________________________   (provider comments below)

## 2020-05-21 ENCOUNTER — Other Ambulatory Visit: Payer: Self-pay

## 2020-05-21 ENCOUNTER — Telehealth: Payer: Self-pay

## 2020-05-21 DIAGNOSIS — N5082 Scrotal pain: Secondary | ICD-10-CM

## 2020-05-21 HISTORY — PX: REPLACEMENT TOTAL KNEE: SUR1224

## 2020-05-21 NOTE — Telephone Encounter (Signed)
   [facility] Pre-operative Risk Assessment    HEARTCARE STAFF: - Please ensure there is not already an duplicate clearance open for this procedure. - Under Visit Info/Reason for Call, type in Other and utilize the format Clearance MM/DD/YY or Clearance TBD. Do not use dashes or single digits. - If request is for dental extraction, please clarify the # of teeth to be extracted.  Request for surgical clearance:  1. What type of surgery is being performed? Left Knee Arthroplasty  2. When is this surgery scheduled? 06/18/20  3. What type of clearance is required (medical clearance vs. Pharmacy clearance to hold med vs. Both)? BOTH  4. Are there any medications that need to be held prior to surgery and how long? N/A  5. Practice name and name of physician performing surgery? [facility] Melrose Nakayama, MD  6. What is the office phone number? [phone]   7.   What is the office fax number? [phone]  8.   Anesthesia type (None, local, MAC, general) ? SPINAL ANESTHESIA   Joshua [name] B Paco Cislo 05/21/2020, 10:54 AM  _________________________________________________________________   (provider comments below)

## 2020-05-21 NOTE — Telephone Encounter (Signed)
Notes faxed to surgeon. This phone note will be removed from the preop pool. Richardson Dopp, PA-C  05/21/2020 10:33 AM

## 2020-05-21 NOTE — Telephone Encounter (Signed)
   Primary Cardiologist: [name], MD  Chart reviewed as part of pre-operative protocol coverage.   80 yo male with - CAD s/p CABG - HTN - HLD - OSA  - Hx of pancreatitis - Bladder CA - Prostate CA - 02/23/2020: Creat 1.33    Cath 04/13/19: 5/5 bypass grafts patent Echocardiogram 04/2012: normal EF, no sig valve dz  Last OV: 04/08/2020 with Dr. Percival Spanish via Telemedicine   RCRI:  Perioperative Risk of Major Cardiac Event is (%): 0.9 (low risk) DASI:  Functional Capacity in METs is: 4.4 (functional status is fair )  Patient was contacted 05/21/2020 in reference to pre-operative risk assessment for pending surgery as outlined below.    Since last seen, DALONTE HARDAGE has not had chest pain, shortness of breath, syncope.  Recommendations:  Therefore, based on ACC/AHA guidelines, the patient is at acceptable risk for the planned procedure without further cardiovascular testing.   Ideally, we would recommend continuing aspirin throughout the perioperative period to further reduce CV risk.  However, if the bleeding risk is too great, the patient may hold aspirin for 7 days prior to his procedure and resume it postoperatively when felt to be safe.   Please call with questions. Richardson Dopp, PA-C 05/21/2020, 10:30 AM

## 2020-05-21 NOTE — Telephone Encounter (Signed)
Duplicate request Notes faxed to surgeon. This phone note will be removed from the preop pool. Richardson Dopp, PA-C  05/21/2020 11:28 AM

## 2020-05-23 NOTE — Telephone Encounter (Signed)
See results note. 

## 2020-05-28 ENCOUNTER — Telehealth: Payer: Self-pay | Admitting: Pulmonary Disease

## 2020-05-28 NOTE — Telephone Encounter (Signed)
We no longer are completing these surgical clearance forms- the pt needs ov and will have risk assessment done and faxed to surgeon after visit is complete. I called and left a detailed msg on machine letting Wells Guiles know this. Called the pt to schedule appt and had to [facility].

## 2020-05-28 NOTE — Telephone Encounter (Signed)
Not that I'm aware of. Surgical clearance papers now go to the team lead office. You may need to check there.

## 2020-05-28 NOTE — Telephone Encounter (Signed)
Joshua [name] have you seen the surgical clearance form on this pt?  thanks

## 2020-05-30 DIAGNOSIS — Z8546 Personal history of malignant neoplasm of prostate: Secondary | ICD-10-CM | POA: Diagnosis not present

## 2020-05-30 DIAGNOSIS — R102 Pelvic and perineal pain: Secondary | ICD-10-CM | POA: Diagnosis not present

## 2020-05-30 DIAGNOSIS — I861 Scrotal varices: Secondary | ICD-10-CM | POA: Diagnosis not present

## 2020-06-03 ENCOUNTER — Encounter: Payer: Self-pay | Admitting: Family Medicine

## 2020-06-04 ENCOUNTER — Telehealth: Payer: Self-pay | Admitting: *Deleted

## 2020-06-04 ENCOUNTER — Ambulatory Visit: Payer: PPO | Admitting: Primary Care

## 2020-06-04 NOTE — Progress Notes (Signed)
@Patient  ID: Joshua [name], male    DOB: May 19, 1940, 80 y.o.   MRN: [id number]  Chief Complaint  Patient presents with  . Follow-up    L knee replacement    Referring provider: [name], MD  HPI: 80 year old male, former smoker. PMH significant for OSA, asthma, allergic rhinitis, HTN, CAD, GERD, IBS, DJD, neuropathy, CKD stage III, overactive bladder, gout, iron deficiency anemia. Patient of Dr. Halford Chessman, last seen in office on 06/02/19.  06/05/2020 - Interim hx  Patient presents today for surgical clearance. Patient is scheduled for left knee arthroplasty under spinal anesthesia on 06/18/20 with [place]. He is here today for risk assessment. Patient is feeling well without acute respiratory complaints. He has not had an asthma flare in the last year. No recent respiratory infections. He is maintained on Flovent hfa 1 puff twice a day. He does not require his albuterol inhaler often.   He is compliant with CPAP use. Pressure auto titrate 6-12cm h20. He wears a full face mask. He has some discomfort with his full face mask rubbing against his nose, he is unable to use nasal mask d/t previous nasal surgery.   Airview download 05/05/20-06/03/20: Usage 29/30 days (97%); 28 days (93%) > 4 hours Average usage 6 hours 49 mins Pressure 6-12cm H20 (10.7cm h20- 95%) Airleak 14L/min (95%) AHI 1.0  Allergies  Allergen Reactions  . 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 Bromfed Nausea And Vomiting  . Clarithromycin Other (See Comments)     gastritis  . Codeine Other (See Comments)    Trouble breathing  . Doxycycline Hyclate Nausea And Vomiting  . Modafinil Other (See Comments)     anxiety-nervousness  . Oxycodone-Acetaminophen Itching  . Promethazine Hcl Other (See Comments)     fainting  . Quinolones Nausea Only    Cipro "felt real bad"  . Telithromycin Nausea And Vomiting  . Hydrocodone-Acetaminophen  Rash  . Statins Other (See Comments)    Leg cramps and makes patient feel bad. Per pt tried multiple in years past  . Sulfonamide Derivatives Rash    Immunization History  Administered Date(s) Administered  . Fluad Quad(high Dose 65+) 11/21/2018  . Influenza Split 01/08/2012, 11/12/2014  . Influenza Whole 12/08/2007, 12/26/2008, 12/22/2010  . Influenza, High Dose Seasonal PF 11/16/2015, 01/02/2016, 01/01/2017, 01/06/2018, 02/01/2019, 11/24/2019, 01/23/2020  . Influenza,inj,Quad PF,6+ Mos 11/30/2012  . Influenza-Unspecified 01/12/2014, 12/21/2017  . PFIZER(Purple Top)SARS-COV-2 Vaccination 04/28/2019, 05/23/2019, 12/21/2019, 01/23/2020  . Pneumococcal Conjugate-13 05/31/2014  . Pneumococcal Polysaccharide-23 06/04/2006, 07/15/2012, 01/02/2016, 12/31/2016, 01/23/2020  . Td 04/23/2005  . Tdap 02/18/2016  . Zoster 01/08/2012  . Zoster Recombinat (Shingrix) 01/31/2018, 04/02/2018    Past Medical History:  Diagnosis Date  . Acute medial meniscal tear   . Anemia   . Arthritis    "middle finger right hand; right knee; neck" (02/06/2014)  . Asthma   . 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 CAD (coronary artery disease)    CABG 2001  . Depression   . Diverticulosis   . DJD (degenerative joint disease)    BACK  . GERD (gastroesophageal reflux disease)   . H/O inguinal hernia repair 12/2018  . History of gout   . Hyperlipidemia   . Hypertension   . IBS (irritable bowel syndrome)   . Obesity   . OSA (obstructive sleep apnea)    USES CPAP   . Pancreatitis ~  1980  . Prostate cancer ([place]) 04/2010    Tobacco History: Social History   Tobacco Use  Smoking Status Former Smoker  . Packs/day: 1.00  . Years: 35.00  . Pack years: 35.00  . Types: Cigarettes  . Quit date: 06/16/1992  . Years since quitting: 27.9  Smokeless Tobacco Never Used   Counseling given: Not Answered   Outpatient Medications Prior to Visit  Medication Sig Dispense Refill  .  acetaminophen (TYLENOL) 500 MG tablet Take 1,000 mg by mouth every 6 (six) hours as needed for moderate pain or headache.    . albuterol (PROAIR HFA) 108 (90 BASE) MCG/ACT inhaler Inhale 1-2 puffs into the lungs every 6 (six) hours as needed for wheezing or shortness of breath. 18 g 3  . amLODipine (NORVASC) 5 MG tablet TAKE 1 TABLET BY MOUTH EVERY DAY (Patient taking differently: Take 5 mg by mouth daily.) 90 tablet 3  . aspirin EC 81 MG tablet Take 81 mg by mouth at bedtime.     . cholecalciferol (VITAMIN D3) 25 MCG (1000 UNIT) tablet Take 1,000 Units by mouth daily.    . clotrimazole-betamethasone (LOTRISONE) cream Apply 1 application topically See admin instructions. Twice daily every 4 days    . Cyanocobalamin (B-12) 2500 MCG TABS Take 1,250 mcg by mouth daily.    . diazepam (VALIUM) 5 MG tablet TAKE 1 TABLET (5 MG TOTAL) BY MOUTH AT BEDTIME AS NEEDED. (Patient taking differently: Take 5 mg by mouth at bedtime as needed (sleep).) 30 tablet 1  . EPINEPHrine 0.3 mg/0.3 mL IJ SOAJ injection Inject 0.3 mg into the muscle as needed for anaphylaxis. for allergic reaction    . FLOVENT HFA 110 MCG/ACT inhaler Inhale 1 puff into the lungs 2 (two) times daily.   5  . fluticasone (FLONASE) 50 MCG/ACT nasal spray Place 1 spray into both nostrils 2 (two) times daily.     . folic acid (FOLVITE) 332 MCG tablet Take 800 mcg by mouth daily.     [name] gabapentin (NEURONTIN) 300 MG capsule Take 1 cap in AM, 1 cap at noon, 2 caps at bedtime (Patient taking differently: Take 300-600 mg by mouth See admin instructions. Take 300 mg in the morning, 300 mg at noon, and 600 mg at bedtime) 360 capsule 3  . Glucos-Chond-Hyal Ac-Ca Fructo (MOVE FREE JOINT HEALTH ADVANCE PO) Take 1 tablet by mouth daily.    . hydrochlorothiazide (HYDRODIURIL) 25 MG tablet TAKE 1 TABLET BY MOUTH EVERY DAY (Patient taking differently: Take 25 mg by mouth daily.) 90 tablet 2  . ketoconazole (NIZORAL) 2 % cream Apply 1 application topically See admin  instructions. Twice a day every other day    . loratadine (CLARITIN) 10 MG tablet Take 10 mg by mouth at bedtime.     [name] losartan (COZAAR) 100 MG tablet TAKE 1 TABLET BY MOUTH EVERY DAY (Patient taking differently: Take 100 mg by mouth daily.) 90 tablet 3  . Miconazole Nitrate (LOTRIMIN AF DEODORANT POWDER) 2 % AERP Apply 1 spray topically daily as needed (jock itch).    . nitroGLYCERIN (NITROSTAT) 0.4 MG SL tablet PLACE 1 TABLET UNDER THE TONGUE EVERY 5 (FIVE) MINUTES AS NEEDED. (Patient taking differently: Place 0.4 mg under the tongue every 5 (five) minutes as needed for chest pain.) 75 tablet 1  . nystatin-triamcinolone ointment (MYCOLOG) Apply 1 application topically See admin instructions. Twice daily every 4 days    . pantoprazole (PROTONIX) 40 MG tablet TAKE 1 TABLET BY MOUTH EVERY DAY (Patient  taking differently: Take 40 mg by mouth daily.) 90 tablet 1  . polyethylene glycol powder (GLYCOLAX/MIRALAX) powder Take 17 g by mouth daily. (Patient taking differently: Take 17 g by mouth in the morning and at bedtime.) 255 g 0  . simvastatin (ZOCOR) 20 MG tablet TAKE 1 TABLET BY MOUTH EVERYDAY AT BEDTIME (Patient taking differently: Take 20 mg by mouth at bedtime.) 90 tablet 3  . traMADol (ULTRAM) 50 MG tablet Take 1 tablet (50 mg total) by mouth every 6 (six) hours as needed. (Patient taking differently: Take 50 mg by mouth every 6 (six) hours as needed for moderate pain.) 20 tablet 1  . Wheat Dextrin (BENEFIBER) POWD Take 1 Dose by mouth daily. 1 dose = 2 teaspoons    . ezetimibe (ZETIA) 10 MG tablet Take 1 tablet (10 mg total) by mouth daily. 90 tablet 3   No facility-administered medications prior to visit.   Review of Systems  Review of Systems  Constitutional: Negative.   HENT: Negative.   Respiratory: Negative.   Cardiovascular: Negative.   Musculoskeletal: Positive for arthralgias and back pain.    Physical Exam  BP 118/76 (BP Location: Left Arm, Cuff Size: Normal)   Pulse 77    Temp (!) 97.4 F (36.3 C)   Ht 5\' 6"  (1.676 m)   Wt 216 lb (98 kg)   SpO2 97%   BMI 34.86 kg/m  Physical Exam Constitutional:      General: He is not in acute distress.    Appearance: Normal appearance. He is obese. He is not ill-appearing.  HENT:     Head: Normocephalic and atraumatic.     Mouth/Throat:     Comments: Deferred d/t masking Cardiovascular:     Rate and Rhythm: Normal rate and regular rhythm.  Pulmonary:     Effort: Pulmonary effort is normal.     Breath sounds: Normal breath sounds. No wheezing or rales.  Musculoskeletal:        General: Normal range of motion.  Skin:    General: Skin is warm and dry.  Neurological:     General: No focal deficit present.     Mental Status: He is alert and oriented to person, place, and time. Mental status is at baseline.  Psychiatric:        Mood and Affect: Mood normal.        Behavior: Behavior normal.        Thought Content: Thought content normal.        Judgment: Judgment normal.      Lab Results:  CBC    Component Value Date/Time   WBC 5.8 02/23/2020 0933   RBC 4.78 02/23/2020 0933   HGB 15.0 02/23/2020 0933   HGB 14.0 04/17/2019 1628   HCT 42.5 02/23/2020 0933   HCT 39.9 04/17/2019 1628   PLT 222 02/23/2020 0933   PLT 247 04/17/2019 1628   MCV 88.9 02/23/2020 0933   MCV 92 04/17/2019 1628   MCH 31.4 02/23/2020 0933   MCHC 35.3 02/23/2020 0933   RDW 12.5 02/23/2020 0933   RDW 12.0 04/17/2019 1628   LYMPHSABS 1,079 02/23/2020 0933   MONOABS 0.6 08/25/2019 1352   EOSABS 273 02/23/2020 0933   BASOSABS 52 02/23/2020 0933    BMET    Component Value Date/Time   NA 132 (L) 02/23/2020 0933   NA 139 04/17/2019 1628   K 4.3 02/23/2020 0933   CL 97 (L) 02/23/2020 0933   CO2 29 02/23/2020 0933   GLUCOSE  99 02/23/2020 0933   BUN 17 02/23/2020 0933   BUN 16 04/17/2019 1628   CREATININE 1.33 (H) 02/23/2020 0933   CALCIUM 9.5 02/23/2020 0933   GFRNONAA 51 (L) 02/23/2020 0933   GFRAA 59 (L) 02/23/2020 0933     BNP    Component Value Date/Time   BNP 32 10/01/2017 1703    ProBNP No results found for: PROBNP  Imaging: DG INJECT DIAG/THERA/INC NEEDLE/CATH/PLC EPI/LUMB/SAC W/IMG  Result Date: 05/07/2020 CLINICAL DATA:  Lumbosacral spondylosis without myelopathy. Left greater than right low back pain. Over 6 months of relief from last epidural injection in December 2020. Symptoms have recurred. FLUOROSCOPY TIME:  Radiation Exposure Index (as provided by the fluoroscopic device): 3.8 mGy Fluoroscopy Time:  16 seconds Number of Acquired Images:  0 PROCEDURE: The procedure, risks, benefits, and alternatives were explained to the patient. Questions regarding the procedure were encouraged and answered. The patient understands and consents to the procedure. LUMBAR EPIDURAL INJECTION: An interlaminar approach was performed on the left at L5-S1. The overlying skin was cleansed and anesthetized. A 3.5 inch 20 gauge epidural needle was advanced using loss-of-resistance technique. DIAGNOSTIC EPIDURAL INJECTION: Injection of Isovue-M 200 shows a good epidural pattern with spread above and below the level of needle placement to both sides. No vascular opacification is seen. THERAPEUTIC EPIDURAL INJECTION: 120 mg of Depo-Medrol mixed with 3 mL of 1% lidocaine were instilled. The procedure was well-tolerated, and the patient was discharged thirty minutes following the injection in good condition. COMPLICATIONS: None immediate. IMPRESSION: Technically successful interlaminar epidural injection on the left at L5-S1. Electronically Signed   By: Titus Dubin M.D.   On: 05/07/2020 11:02   US SCROTUM W/DOPPLER  Result Date: 05/18/2020 CLINICAL DATA:  Scrotal pain EXAM: SCROTAL ULTRASOUND DOPPLER ULTRASOUND OF THE TESTICLES TECHNIQUE: Complete ultrasound examination of the testicles, epididymis, and other scrotal structures was performed. Color and spectral Doppler ultrasound were also utilized to evaluate blood flow to the  testicles. COMPARISON:  Scrotal ultrasound 09/12/2018 FINDINGS: Right testicle Measurements: 4.8 x 2.3 x 3.1 cm. No mass or microlithiasis visualized. Left testicle Measurements: 5.0 x 2.7 x 2.9 cm. No mass or microlithiasis visualized. Right epididymis:  6 x 4 x 9 mm epididymal cyst. Left epididymis:  Normal in size and appearance. Hydrocele:  Small bilateral hydrocele Varicocele: Right-sided varicocele measuring up to 3 mm in diameter with increased flow during Valsalva. Prominent veins on the left measuring 2.1 mm in diameter. Pulsed Doppler interrogation of both testes demonstrates normal low resistance arterial and venous waveforms bilaterally. IMPRESSION: Normal testes. Right varicocele. Electronically Signed   By: Franchot Gallo M.D.   On: 05/18/2020 09:00     Assessment & Plan:   OSA (obstructive sleep apnea) - Patient is 93% compliant with CPAP use > 4 hours each night - Pressure 6-12cm h20; Residual AHI 1.0 - Recommend patient try Philips dreamwisp full face mask size medium - FU in 1 year with Dr. Halford Chessman  Asthma - Well controlled on Flovent HFA 1 puff twice daily and prn Albuterol  - No recent exacerbations or hospitalizations d.t asthma   Pre-operative respiratory examination Risk stratification score showed patient is low risk from pulmonary standpoint for left total knee replacement  Major Pulmonary risks identified in the multifactorial risk analysis are but not limited to a) pneumonia; b) recurrent intubation risk; c) prolonged or recurrent acute respiratory failure needing mechanical ventilation; d) prolonged hospitalization; e) DVT/Pulmonary embolism; f) Acute Pulmonary edema  Recommend 1. Short duration of surgery as  much as possible and avoid paralytic if possible 2. Recovery in step down or ICU with Pulmonary consultation if needed  3. DVT prophylaxis 4. Aggressive pulmonary toilet with o2, bronchodilatation, and incentive spirometry and early ambulation    1) RISK FOR  PROLONGED MECHANICAL VENTILAION - > 48h  1A) Arozullah - Prolonged mech ventilation risk Arozullah Postperative Pulmonary Risk Score - for mech ventilation dependence >48h [employer], [facility] 2000, major non-cardiac surgery) Comment Score  Type of surgery - abd ao aneurysm (27), thoracic (21), neurosurgery / upper abdominal / vascular (21), neck (11) Left Knee replacement 5  Emergency Surgery - (11)  0  ALbumin < 3 or poor nutritional state - (9)  0  BUN > 30 -  (8)  0  Partial or completely dependent functional status - (7)  0  COPD -  (6)  0  Age - 60 to 69 (4), > 70  (6)  6  TOTAL  11  Risk Stratifcation scores  - < 10 (0.5%), 11-19 (1.8%), 20-27 (4.2%), 28-40 (10.1%), >40 (26.6%)  1.8% risk for mech ventilation dependence    1B) GUPTA - Prolonged Mech Vent Risk Score source Risk  Guptal post op prolonged mech ventilation > 48h or reintubation < 30 days - ACS 2007-2008 dataset - http://lewis-perez.info/ 0.3 % Risk of mechanical ventilation for >48 hrs after surgery, or unplanned intubation ?30 days of surgery    2) RISK FOR POST OP PNEUMONIA Score source Risk  Lyndel Safe - Post Op Pnemounia risk  [email or web address] 0.4 % Risk of postoperative pneumonia    R3) ISK FOR ANY POST-OP PULMONARY COMPLICATION Score source Risk  CANET/ARISCAT Score - risk for ANY/ALl pulmonary complications - > risk of in-hospital post-op pulmonary complications (composite including respiratory failure, respiratory infection, pleural effusion, atelectasis, pneumothorax, bronchospasm, aspiration pneumonitis) [employer] - based on age, anemia, pulse ox, resp infection prior 30d, incision site, duration of surgery, and emergency v elective surgery 19 points ARISCAT Score  Low risk 1.6% risk of in-hospital post-op pulmonary complications (composite including  respiratory failure, respiratory infection, pleural effusion, atelectasis, pneumothorax, bronchospasm, aspiration pneumonitis)     Martyn Ehrich, NP 06/05/2020

## 2020-06-04 NOTE — Telephone Encounter (Signed)
Called and spoke with patient regarding change in time and provider for appointment for 06/05/20.  Advised changed to 9:30 am with BW.  Patient verbalized understanding.    [place] regarding patient appointment.  Left message for Marlin Canary (surgical coordinator) regarding patient appointment for tomorrow.

## 2020-06-04 NOTE — Telephone Encounter (Signed)
Pt has OV scheduled with Derl Barrow, NP, on 3/16 for surgical clearance. Nothing further needed at this time.

## 2020-06-05 ENCOUNTER — Other Ambulatory Visit: Payer: Self-pay

## 2020-06-05 ENCOUNTER — Encounter: Payer: Self-pay | Admitting: Primary Care

## 2020-06-05 ENCOUNTER — Ambulatory Visit: Payer: PPO | Admitting: Adult Health

## 2020-06-05 ENCOUNTER — Ambulatory Visit: Payer: PPO | Admitting: Primary Care

## 2020-06-05 VITALS — BP 118/76 | HR 77 | Temp 97.4°F | Ht 66.0 in | Wt 216.0 lb

## 2020-06-05 DIAGNOSIS — G4733 Obstructive sleep apnea (adult) (pediatric): Secondary | ICD-10-CM | POA: Diagnosis not present

## 2020-06-05 DIAGNOSIS — J4540 Moderate persistent asthma, uncomplicated: Secondary | ICD-10-CM

## 2020-06-05 DIAGNOSIS — Z01811 Encounter for preprocedural respiratory examination: Secondary | ICD-10-CM

## 2020-06-05 NOTE — Assessment & Plan Note (Signed)
-   Patient is 93% compliant with CPAP use > 4 hours each night - Pressure 6-12cm h20; Residual AHI 1.0 - Recommend patient try Philips dreamwisp full face mask size medium - FU in 1 year with Dr. Halford Chessman

## 2020-06-05 NOTE — Assessment & Plan Note (Addendum)
Risk stratification score showed patient is low risk from pulmonary standpoint for left knee arthroplasty  Major Pulmonary risks identified in the multifactorial risk analysis are but not limited to a) pneumonia; b) recurrent intubation risk; c) prolonged or recurrent acute respiratory failure needing mechanical ventilation; d) prolonged hospitalization; e) DVT/Pulmonary embolism; f) Acute Pulmonary edema  Recommend 1. Short duration of surgery as much as possible and avoid paralytic if possible 2. Recovery in step down or ICU with Pulmonary consultation if needed  3. DVT prophylaxis 4. Aggressive pulmonary toilet with o2, bronchodilatation, and incentive spirometry and early ambulation

## 2020-06-05 NOTE — Progress Notes (Signed)
Reviewed and agree with assessment/plan.   Chesley Mires, MD [facility] Pulmonary/Critical Care 06/05/2020, 10:09 AM Pager:  [phone]

## 2020-06-05 NOTE — Assessment & Plan Note (Signed)
-   Well controlled on Flovent HFA 1 puff twice daily and prn Albuterol  - No recent exacerbations or hospitalizations d.t asthma

## 2020-06-05 NOTE — Patient Instructions (Addendum)
You are considered low risk for inter/post-op pulmonary complications following knee surgery .   Orthopedics has already ordered CXR to be completed pre-op, we do not need to do one today   Recommendations: - Early ambulation after surgery - Use incentive spirometer after surgery - Continue Flovent 1 puff twice daily  - Continue to wear CPAP every night for 4-6 hours or longer. Museum/gallery conservator with you day of surgery   Orders DME order for [employer] Dream wisp full face mask size medium   Follow-up 1 year with Dr. Halford Chessman or sooner if needed     Living With Sleep Apnea Sleep apnea is a condition in which breathing pauses or becomes shallow during sleep. Sleep apnea is most commonly caused by a collapsed or blocked airway. People with sleep apnea snore loudly and have times when they gasp and stop breathing for 10 seconds or more during sleep. This happens over and over during the night. This disrupts your sleep and keeps your body from getting the rest that it needs, which can cause tiredness and lack of energy (fatigue) during the day. The breaks in breathing also interrupt the deep sleep that you need to feel rested. Even if you do not completely wake up from the gaps in breathing, your sleep may not be restful. You may also have a headache in the morning and low energy during the day, and you may feel anxious or depressed. How can sleep apnea affect me? Sleep apnea increases your chances of extreme tiredness during the day (daytime fatigue). It can also increase your risk for health conditions, such as:  Heart attack.  Stroke.  Diabetes.  Heart failure.  Irregular heartbeat.  High blood pressure. If you have daytime fatigue as a result of sleep apnea, you may be more likely to:  Perform poorly at school or work.  Fall asleep while driving.  Have difficulty with attention.  Develop depression or anxiety.  Become severely overweight (obese).  Have sexual  dysfunction. What actions can I take to manage sleep apnea? Sleep apnea treatment  If you were given a device to open your airway while you sleep, use it only as told by your health care provider. You may be given: ? An oral appliance. This is a custom-made mouthpiece that shifts your lower jaw forward. ? A continuous positive airway pressure (CPAP) device. This device blows air through a mask when you breathe out (exhale). ? A nasal expiratory positive airway pressure (EPAP) device. This device has valves that you put into each nostril. ? A bi-level positive airway pressure (BPAP) device. This device blows air through a mask when you breathe in (inhale) and breathe out (exhale).  You may need surgery if other treatments do not work for you.   Sleep habits  Go to sleep and wake up at the same time every day. This helps set your internal clock (circadian rhythm) for sleeping. ? If you stay up later than usual, such as on weekends, try to get up in the morning within 2 hours of your normal wake time.  Try to get at least 7-9 hours of sleep each night.  Stop computer, tablet, and mobile phone use a few hours before bedtime.  Do not take long naps during the day. If you nap, limit it to 30 minutes.  Have a relaxing bedtime routine. Reading or listening to music may relax you and help you sleep.  Use your bedroom only for sleep. ? Keep your television and computer  out of your bedroom. ? Keep your bedroom cool, dark, and quiet. ? Use a supportive mattress and pillows.  Follow your health care provider's instructions for other changes to sleep habits. Nutrition  Do not eat heavy meals in the evening.  Do not have caffeine in the later part of the day. The effects of caffeine can last for more than 5 hours.  Follow your health care provider's or dietitian's instructions for any diet changes. Lifestyle  Do not drink alcohol before bedtime. Alcohol can cause you to fall asleep at first,  but then it can cause you to wake up in the middle of the night and have trouble getting back to sleep.  Do not use any products that contain nicotine or tobacco, such as cigarettes and e-cigarettes. If you need help quitting, ask your health care provider.      Medicines  Take over-the-counter and prescription medicines only as told by your health care provider.  Do not use over-the-counter sleep medicine. You can become dependent on this medicine, and it can make sleep apnea worse.  Do not use medicines, such as sedatives and narcotics, unless told by your health care provider. Activity  Exercise on most days, but avoid exercising in the evening. Exercising near bedtime can interfere with sleeping.  If possible, spend time outside every day. Natural light helps regulate your circadian rhythm. General information  Lose weight if you need to, and maintain a healthy weight.  Keep all follow-up visits as told by your health care provider. This is important.  If you are having surgery, make sure to tell your health care provider that you have sleep apnea. You may need to bring your device with you. Where to find more information Learn more about sleep apnea and daytime fatigue from:  American Sleep Association: sleepassociation.[place]: sleepfoundation.org  National Heart, Lung, and Blood Institute: https://www.hartman-hill.biz/ Summary  Sleep apnea can cause daytime fatigue and other serious health conditions.  Both sleep apnea and daytime fatigue can be bad for your health and well-being.  You may need to wear a device while sleeping to help keep your airway open.  If you are having surgery, make sure to tell your health care provider that you have sleep apnea. You may need to bring your device with you.  Making changes to sleep habits, diet, lifestyle, and activity can help you manage sleep apnea. This information is not intended to replace advice given to you by  your health care provider. Make sure you discuss any questions you have with your health care provider. Document Revised: 07/01/2018 Document Reviewed: 06/03/2017 Elsevier Patient Education  2021 [employer].  CPAP and BPAP Information CPAP and BPAP are methods that use air pressure to keep your airways open and to help you breathe well. CPAP and BPAP use different amounts of pressure. Your health care provider will tell you whether CPAP or BPAP would be more helpful for you.  CPAP stands for "continuous positive airway pressure." With CPAP, the amount of pressure stays the same while you breathe in and out.  BPAP stands for "bi-level positive airway pressure." With BPAP, the amount of pressure will be higher when you breathe in (inhale) and lower when you breathe out(exhale). This allows you to take larger breaths. CPAP or BPAP may be used in the hospital, or your health care provider may want you to use it at home. You may need to have a sleep study before your health care provider can  order a machine for you to use at home. Why are CPAP and BPAP treatments used? CPAP or BPAP can be helpful if you have:  Sleep apnea.  Chronic obstructive pulmonary disease (COPD).  Heart failure.  Medical conditions that cause muscle weakness, including muscular dystrophy or amyotrophic lateral sclerosis (ALS).  Other problems that cause breathing to be shallow, weak, abnormal, or difficult. CPAP and BPAP are most commonly used for obstructive sleep apnea (OSA) to keep the airways from collapsing when the muscles relax during sleep. How is CPAP or BPAP administered? Both CPAP and BPAP are provided by a small machine with a flexible plastic tube that attaches to a plastic mask that you wear. Air is blown through the mask into your nose or mouth. The amount of pressure that is used to blow the air can be adjusted on the machine. Your health care provider will set the pressure setting and help you find the  best mask for you. When should CPAP or BPAP be used? In most cases, the mask only needs to be worn during sleep. Generally, the mask needs to be worn throughout the night and during any daytime naps. People with certain medical conditions may also need to wear the mask at other times when they are awake. Follow instructions from your health care provider about when to use the machine. What are some tips for using the mask?  Because the mask needs to be snug, some people feel trapped or closed-in (claustrophobic) when first using the mask. If you feel this way, you may need to get used to the mask. One way to do this is to hold the mask loosely over your nose or mouth and then gradually apply the mask more snugly. You can also gradually increase the amount of time that you use the mask.  Masks are available in various types and sizes. If your mask does not fit well, talk with your health care provider about getting a different one. Some common types of masks include: ? Full face masks, which fit over the mouth and nose. ? Nasal masks, which fit over the nose. ? Nasal pillow or prong masks, which fit into the nostrils.  If you are using a mask that fits over your nose and you tend to breathe through your mouth, a chin strap may be applied to help keep your mouth closed.  Some CPAP and BPAP machines have alarms that may sound if the mask comes off or develops a leak.  If you have trouble with the mask, it is very important that you talk with your health care provider about finding a way to make the mask easier to tolerate. Do not stop using the mask. There could be a negative impact to your health if you stop using the mask.   What are some tips for using the machine?  Place your CPAP or BPAP machine on a secure table or stand near an electrical outlet.  Know where the on/off switch is on the machine.  Follow instructions from your health care provider about how to set the pressure on your  machine and when you should use it.  Do not eat or drink while the CPAP or BPAP machine is on. Food or fluids could get pushed into your lungs by the pressure of the CPAP or BPAP.  For home use, CPAP and BPAP machines can be rented or purchased through home health care companies. Many different brands of machines are available. Renting a machine before  purchasing may help you find out which particular machine works well for you. Your insurance may also decide which machine you may get.  Keep the CPAP or BPAP machine and attachments clean. Ask your health care provider for specific instructions. Follow these instructions at home:  Do not use any products that contain nicotine or tobacco, such as cigarettes, e-cigarettes, and chewing tobacco. If you need help quitting, ask your health care provider.  Keep all follow-up visits as told by your health care provider. This is important. Contact a health care provider if:  You have redness or pressure sores on your head, face, mouth, or nose from the mask or head gear.  You have trouble using the CPAP or BPAP machine.  You cannot tolerate wearing the CPAP or BPAP mask.  Someone tells you that you snore even when wearing your CPAP or BPAP. Get help right away if:  You have trouble breathing.  You feel confused. Summary  CPAP and BPAP are methods that use air pressure to keep your airways open and to help you breathe well.  You may need to have a sleep study before your health care provider can order a machine for home use.  If you have trouble with the mask, it is very important that you talk with your health care provider about finding a way to make the mask easier to tolerate. Do not stop using the mask. There could be a negative impact to your health if you stop using the mask.  Follow instructions from your health care provider about when to use the machine. This information is not intended to replace advice given to you by your health  care provider. Make sure you discuss any questions you have with your health care provider. Document Revised: 03/31/2019 Document Reviewed: 04/03/2019 Elsevier Patient Education  2021 [employer].

## 2020-06-06 NOTE — Progress Notes (Signed)
[facility] Phone: [phone] Subjective:   [name], am serving as a scribe for Dr. Hulan Saas. This visit occurred during the SARS-CoV-2 public health emergency.  Safety protocols were in place, including screening questions prior to the visit, additional usage of staff PPE, and extensive cleaning of exam room while observing appropriate contact time as indicated for disinfecting solutions.   I'm seeing this patient by the request  of:  [name], MD  CC: Neck pain and back pain follow-up  [id number]   04/25/2020 Patient has not responded well to the conservative therapy including steroid injections as well as the viscosupplementation.  At this point we will refer patient to orthopedic surgery to discuss the potential for surgical intervention.  Patient would not want to do it any pain in the near future secondary to his wife recently having shoulder surgery.  Significant degenerative disc disease.  Continues to have more of a left-sided sciatica.  I do believe that an epidural would be beneficial for patient again.  This could be also contributing to some of patient's left knee pain even though I do think that that moderate to severe arthritic changes need to be concerning as well.  Patient is in agreement that he will try to have the epidural.  Patient is the primary caregiver for his wife though who did have shoulder surgery in the last month.  Patient will try to get this scheduled soon and the patient is on Plavix from cardiologist.  Per last cardiology note able to hold it for 5 days for the injection.  Follow-up with me again 4 weeks after injection.   Update 06/07/2020 IZEA LIVOLSI is a 80 y.o. male coming in with complaint of left knee and back pain. Scheduled for TKR surgery on 06/18/2020. Epidural on 05/07/2020. Patient states that epidural wore off a few weeks ago. Pain in gets worse when he  lies on his stomach and he has pain throughout the day as well.  Patient states that it is always there.  Not stopping him though significantly at this time.  Is worried about having to use compression stockings after surgery due to peripheral neuropathy pain.       Past Medical History:  Diagnosis Date  . Acute medial meniscal tear   . Anemia   . Arthritis    "middle finger right hand; right knee; neck" (02/06/2014)  . Asthma   . 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 CAD (coronary artery disease)    CABG 2001  . Depression   . Diverticulosis   . DJD (degenerative joint disease)    BACK  . GERD (gastroesophageal reflux disease)   . H/O inguinal hernia repair 12/2018  . History of gout   . Hyperlipidemia   . Hypertension   . IBS (irritable bowel syndrome)   . Obesity   . OSA (obstructive sleep apnea)    USES CPAP   . Pancreatitis ~ 1980  . Prostate cancer ([facility]) 04/2010   Past Surgical History:  Procedure Laterality Date  . ANTERIOR CERVICAL DECOMP/DISCECTOMY FUSION  05/26/2006   PLATE PLACED  . CARDIAC CATHETERIZATION  11/20/1999  . CORONARY ARTERY BYPASS GRAFT  11/20/1999   SVG-RI1-RI2, SVG-OM, SVG-dRCA  . CORONARY STENT INTERVENTION N/A 04/13/2019   Procedure: CORONARY STENT INTERVENTION;  Surgeon: Burnell Blanks, MD;  Location: [facility];  Service: Cardiovascular;  Laterality: N/A;  .  INGUINAL HERNIA REPAIR Left 01/02/2019   Procedure: OPEN LEFT INGUINAL HERNIA REPAIR WITH MESH;  Surgeon: Johnathan Hausen, MD;  Location: [facility];  Service: General;  Laterality: Left;  . KNEE ARTHROSCOPY Right 1992; 04/2008  . LAPAROSCOPIC CHOLECYSTECTOMY  2007  . LEFT HEART CATH AND CORS/GRAFTS ANGIOGRAPHY N/A 04/13/2019   Procedure: LEFT HEART CATH AND CORS/GRAFTS ANGIOGRAPHY;  Surgeon: Burnell Blanks, MD;  Location: [facility];  Service: Cardiovascular;  Laterality: N/A;  . NASAL SEPTUM SURGERY Right 2007  . ROBOT ASSISTED  LAPAROSCOPIC RADICAL PROSTATECTOMY  04/2010  . THYROIDECTOMY, PARTIAL  1980   Social History   Socioeconomic History  . Marital status: Married    Spouse name: Not on file  . Number of children: Not on file  . Years of education: Not on file  . Highest education level: Not on file  Occupational History  . Occupation: retired    Fish farm manager: RETIRED  Tobacco Use  . Smoking status: Former Smoker    Packs/day: 1.00    Years: 35.00    Pack years: 35.00    Types: Cigarettes    Quit date: 06/16/1992    Years since quitting: 27.9  . Smokeless tobacco: Never Used  Vaping Use  . Vaping Use: Never used  Substance and Sexual Activity  . Alcohol use: Yes    Alcohol/week: 7.0 standard drinks    Types: 7 Shots of liquor per week    Comment: mixed drink Q DAY  . Drug use: No  . Sexual activity: Not Currently    Partners: Female  Other Topics Concern  . Not on file  Social History Narrative   Married 42 years in 2015. No kids (mumps at age 86)      Retired from [employer] in Engineer, technical sales   Volunteers at [place]: poker, movies and tv, staying active      Right handed   2 level home with steps he uses   Social Determinants of Radio broadcast assistant Strain: [place]   . Difficulty of Paying Living Expenses: Not hard at all  Food Insecurity: No Food Insecurity  . Worried About Charity fundraiser in the Last Year: Never true  . Ran Out of Food in the Last Year: Never true  Transportation Needs: No Transportation Needs  . Lack of Transportation (Medical): No  . Lack of Transportation (Non-Medical): No  Physical Activity: Inactive  . Days of Exercise per Week: 0 days  . Minutes of Exercise per Session: 0 min  Stress: No Stress Concern Present  . Feeling of Stress : Only a little  Social Connections: Moderately Isolated  . Frequency of Communication with Friends and Family: More than three times a week  . Frequency of Social Gatherings with Friends and Family: Three times  a week  . Attends Religious Services: Never  . Active Member of Clubs or Organizations: No  . Attends Archivist Meetings: Never  . Marital Status: Married   Allergies  Allergen Reactions  . 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 Bromfed Nausea And Vomiting  . Clarithromycin Other (See Comments)     gastritis  . Codeine Other (See Comments)    Trouble breathing  . Doxycycline Hyclate Nausea And Vomiting  . Modafinil Other (See Comments)     anxiety-nervousness  . Oxycodone-Acetaminophen Itching  . Promethazine Hcl Other (See Comments)  fainting  . Quinolones Nausea Only    Cipro "felt real bad"  . Telithromycin Nausea And Vomiting  . Hydrocodone-Acetaminophen Rash  . Statins Other (See Comments)    Leg cramps and makes patient feel bad. Per pt tried multiple in years past  . Sulfonamide Derivatives Rash   Family History  Problem Relation Age of Onset  . Heart disease Mother   . Hyperlipidemia Mother   . Heart disease Father   . Hyperlipidemia Father   . Diabetes Brother      Current Outpatient Medications (Cardiovascular):  .  amLODipine (NORVASC) 5 MG tablet, TAKE 1 TABLET BY MOUTH EVERY DAY (Patient taking differently: Take 5 mg by mouth daily.) .  EPINEPHrine 0.3 mg/0.3 mL IJ SOAJ injection, Inject 0.3 mg into the muscle as needed for anaphylaxis. for allergic reaction .  hydrochlorothiazide (HYDRODIURIL) 25 MG tablet, TAKE 1 TABLET BY MOUTH EVERY DAY (Patient taking differently: Take 25 mg by mouth daily.) .  losartan (COZAAR) 100 MG tablet, TAKE 1 TABLET BY MOUTH EVERY DAY (Patient taking differently: Take 100 mg by mouth daily.) .  nitroGLYCERIN (NITROSTAT) 0.4 MG SL tablet, PLACE 1 TABLET UNDER THE TONGUE EVERY 5 (FIVE) MINUTES AS NEEDED. (Patient taking differently: Place 0.4 mg under the tongue every 5 (five) minutes as needed for chest pain.) .  simvastatin (ZOCOR) 20 MG tablet, TAKE 1  TABLET BY MOUTH EVERYDAY AT BEDTIME (Patient taking differently: Take 20 mg by mouth at bedtime.) .  ezetimibe (ZETIA) 10 MG tablet, Take 1 tablet (10 mg total) by mouth daily.  Current Outpatient Medications (Respiratory):  .  albuterol (PROAIR HFA) 108 (90 BASE) MCG/ACT inhaler, Inhale 1-2 puffs into the lungs every 6 (six) hours as needed for wheezing or shortness of breath. Cristy Friedlander HFA 110 MCG/ACT inhaler, Inhale 1 puff into the lungs 2 (two) times daily.  .  fluticasone (FLONASE) 50 MCG/ACT nasal spray, Place 1 spray into both nostrils 2 (two) times daily.  [name]  loratadine (CLARITIN) 10 MG tablet, Take 10 mg by mouth at bedtime.   Current Outpatient Medications (Analgesics):  .  acetaminophen (TYLENOL) 500 MG tablet, Take 1,000 mg by mouth every 6 (six) hours as needed for moderate pain or headache. [name]  aspirin EC 81 MG tablet, Take 81 mg by mouth at bedtime.  .  traMADol (ULTRAM) 50 MG tablet, Take 1 tablet (50 mg total) by mouth every 6 (six) hours as needed. (Patient taking differently: Take 50 mg by mouth every 6 (six) hours as needed for moderate pain.)  Current Outpatient Medications (Hematological):  [name]  Cyanocobalamin (B-12) 2500 MCG TABS, Take 1,250 mcg by mouth daily. .  folic acid (FOLVITE) 423 MCG tablet, Take 800 mcg by mouth daily.   Current Outpatient Medications (Other):  .  cholecalciferol (VITAMIN D3) 25 MCG (1000 UNIT) tablet, Take 1,000 Units by mouth daily. .  clotrimazole-betamethasone (LOTRISONE) cream, Apply 1 application topically See admin instructions. Twice daily every 4 days .  diazepam (VALIUM) 5 MG tablet, TAKE 1 TABLET (5 MG TOTAL) BY MOUTH AT BEDTIME AS NEEDED. (Patient taking differently: Take 5 mg by mouth at bedtime as needed (sleep).) .  gabapentin (NEURONTIN) 300 MG capsule, Take 1 cap in AM, 1 cap at noon, 2 caps at bedtime (Patient taking differently: Take 300-600 mg by mouth See admin instructions. Take 300 mg in the morning, 300 mg at noon, and 600  mg at bedtime) .  Glucos-Chond-Hyal Ac-Ca Fructo (MOVE FREE JOINT HEALTH ADVANCE PO), Take  1 tablet by mouth daily. [name]  ketoconazole (NIZORAL) 2 % cream, Apply 1 application topically See admin instructions. Twice a day every other day .  Miconazole Nitrate (LOTRIMIN AF DEODORANT POWDER) 2 % AERP, Apply 1 spray topically daily as needed (jock itch). .  nystatin-triamcinolone ointment (MYCOLOG), Apply 1 application topically See admin instructions. Twice daily every 4 days .  pantoprazole (PROTONIX) 40 MG tablet, TAKE 1 TABLET BY MOUTH EVERY DAY (Patient taking differently: Take 40 mg by mouth daily.) .  polyethylene glycol powder (GLYCOLAX/MIRALAX) powder, Take 17 g by mouth daily. (Patient taking differently: Take 17 g by mouth in the morning and at bedtime.) .  Wheat Dextrin (BENEFIBER) POWD, Take 1 Dose by mouth daily. 1 dose = 2 teaspoons   Reviewed prior external information including notes and imaging from  primary care provider As well as notes that were available from care everywhere and other healthcare systems.  Past medical history, social, surgical and family history all reviewed in electronic medical record.  No pertanent information unless stated regarding to the chief complaint.   Review of Systems:  No headache, visual changes, nausea, vomiting, diarrhea, constipation, dizziness, abdominal pain, skin rash, fevers, chills, night sweats, weight loss, swollen lymph nodes, joint swelling, chest pain, shortness of breath, mood changes. POSITIVE muscle aches, body aches  Objective  Blood pressure 112/62, pulse 83, height 5\' 6"  (1.676 m), weight 216 lb (98 kg), SpO2 96 %.   General: No apparent distress alert and oriented x3 mood and affect normal, dressed appropriately.  HEENT: Pupils equal, extraocular movements intact  Respiratory: Patient's speak in full sentences and does not appear short of breath  Cardiovascular: 1+ lower extremity edema, non tender, no erythema  Gait mild  antalgic favoring the left knee. MSK: Patient back exam does have loss of lordosis.  Sitting comfortably in the chair.  Mild weakness of core strength noted.  Patient is neurovascularly intact distally at the moment.   Impression and Recommendations:     The above documentation has been reviewed and is accurate and complete Lyndal Pulley, DO

## 2020-06-07 ENCOUNTER — Encounter: Payer: Self-pay | Admitting: Family Medicine

## 2020-06-07 ENCOUNTER — Ambulatory Visit: Payer: PPO | Admitting: Family Medicine

## 2020-06-07 ENCOUNTER — Other Ambulatory Visit: Payer: Self-pay

## 2020-06-07 DIAGNOSIS — G629 Polyneuropathy, unspecified: Secondary | ICD-10-CM

## 2020-06-07 DIAGNOSIS — M5136 Other intervertebral disc degeneration, lumbar region: Secondary | ICD-10-CM | POA: Diagnosis not present

## 2020-06-07 DIAGNOSIS — M1712 Unilateral primary osteoarthritis, left knee: Secondary | ICD-10-CM | POA: Diagnosis not present

## 2020-06-07 NOTE — Patient Instructions (Addendum)
Ask about pneumatic compression boot and calf sleeves or ask about a blood thinner Good to see you You are in good hands

## 2020-06-07 NOTE — Assessment & Plan Note (Signed)
Patient does have known neuropathy.  Patient does respond somewhat to gabapentin.  Patient though is concerned because compression socks cause severe amount of pain in patient's feet.  Discussed with patient to discuss with his surgeon about the possibility of pneumatic compression boots as well as calf compression we also discussed the potential for a blood thinner if patient really is concerned.  Patient will discuss with them.

## 2020-06-07 NOTE — Assessment & Plan Note (Signed)
Patient is undergoing replacement in the near future.  Hopefully the patient will do very good.  We discussed that he can follow-up with him on an as-needed basis but he is in good hands with his surgeon.

## 2020-06-07 NOTE — Assessment & Plan Note (Signed)
Significant degenerative disc disease.  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  MRI is from 2019 and we did discuss potentially repeating.  Patient at this point would like to finish up and get his knee replaced first and see if that helps with some of the back pain.  I am hopeful that the move will make a difference by helping patient with gait.  If not I would say first step would be to repeat imaging and then see if other injections would be beneficial or if surgical intervention would be necessary.

## 2020-06-10 ENCOUNTER — Ambulatory Visit (INDEPENDENT_AMBULATORY_CARE_PROVIDER_SITE_OTHER): Payer: PPO

## 2020-06-10 ENCOUNTER — Telehealth: Payer: Self-pay | Admitting: Primary Care

## 2020-06-10 ENCOUNTER — Telehealth: Payer: Self-pay

## 2020-06-10 DIAGNOSIS — E785 Hyperlipidemia, unspecified: Secondary | ICD-10-CM | POA: Diagnosis not present

## 2020-06-10 DIAGNOSIS — I10 Essential (primary) hypertension: Secondary | ICD-10-CM | POA: Diagnosis not present

## 2020-06-10 NOTE — Progress Notes (Signed)
Chronic Care Management Pharmacy Note  06/10/2020 Name:  Joshua [name] MRN:  [id number] DOB:  May 30, 1940  Subjective: Joshua [name] is an 80 y.o. year old male who is a primary patient of Hunter, Brayton Mars, MD.  The CCM team was consulted for assistance with disease management and care coordination needs.    Engaged with patient by telephone for follow up visit in response to provider referral for pharmacy case management and/or care coordination services.   Consent to Services:  The patient was given information about Chronic Care Management services, agreed to services, and gave verbal consent prior to initiation of services.  Please see initial visit note for detailed documentation.   Patient Care Team: [name], MD as PCP - General (Family Medicine) [name], MD as PCP - Cardiology (Cardiology) Gerda Diss, DO as Consulting Physician (Sports Medicine) Cameron Sprang, MD as Consulting Physician (Neurology) Chesley Mires, MD as Consulting Physician (Pulmonary Disease) Lyndal Pulley, DO as Consulting Physician (Sports Medicine) Tiajuana Amass, MD as Consulting Physician (Allergy and Immunology) Madelin Rear, [facility] (Pharmacist) Madelin Rear, [facility] as Pharmacist (Pharmacist)  Objective: Lab Results  Component Value Date   CREATININE 1.33 (H) 02/23/2020   CREATININE 1.22 08/25/2019   CREATININE 1.26 06/06/2019   GFR 57.67 (L) 08/25/2019   GFR 55.60 (L) 06/06/2019   GFRNONAA 51 (L) 02/23/2020   GFRNONAA 49 (L) 04/17/2019  Last diabetic Eye exam: No results found for: HMDIABEYEEXA  Last diabetic Foot exam: No results found for: HMDIABFOOTEX  Lab Results  Component Value Date   CHOL 155 04/16/2020   CHOL 149 02/23/2020   TRIG 176 (H) 04/16/2020   TRIG 130 02/23/2020   HDL 41 04/16/2020   HDL 42 02/23/2020   CHOLHDL 3.8 04/16/2020   CHOLHDL 3.5 02/23/2020   VLDL 31.6 11/03/2018   VLDL 30 01/27/2018   LDLCALC 84 04/16/2020   LDLCALC 84 02/23/2020    LDLDIRECT 115.0 06/04/2016   LDLDIRECT 81.1 07/04/2009   Hepatic Function Latest Ref Rng & Units 04/16/2020 02/23/2020 08/25/2019  Total Protein 6.0 - 8.5 g/dL 6.7 6.8 6.3  Albumin 3.7 - 4.7 g/dL 4.3 - 4.1  AST 0 - 40 IU/L _0 ALT 0 - 44 IU/L _1 Alk Phosphatase 44 - 121 IU/L 75 - 63  Total Bilirubin 0.0 - 1.2 mg/dL 0.8 1.1 0.6  Bilirubin, Direct 0.00 - 0.40 mg/dL 0.23 - -   Lab Results  Component Value Date/Time   TSH 0.99 08/25/2019 01:52 PM   TSH 1.31 10/27/2018 11:56 AM   FREET4 0.83 09/13/2014 04:15 PM   FREET4 0.79 11/30/2012 09:58 AM   CBC Latest Ref Rng & Units 02/23/2020 08/25/2019 04/17/2019  WBC 3.8 - 10.8 Thousand/uL 5.8 6.7 8.0  Hemoglobin 13.2 - 17.1 g/dL 15.0 13.8 14.0  Hematocrit 38.5 - 50.0 % 42.5 39.3 39.9  Platelets 140 - 400 Thousand/uL 222 234.0 247   Lab Results  Component Value Date/Time   VD25OH 62 02/23/2020 09:33 AM   VD25OH 48.85 08/25/2019 01:52 PM   VD25OH 27.18 (L) 10/27/2018 11:56 AM    Clinical ASCVD: No  The 10-year ASCVD risk score Mikey Bussing DC Jr., et al., 2013) is: 25.7%   Values used to calculate the score:     Age: 64 years     Sex: Male     Is Non-Hispanic African American: No     Diabetic: No     Tobacco smoker: No     Systolic  Blood Pressure: 112 mmHg     Is BP treated: Yes     HDL Cholesterol: 41 mg/dL     Total Cholesterol: 155 mg/dL    Social History   Tobacco Use  Smoking Status Former Smoker  . Packs/day: 1.00  . Years: 35.00  . Pack years: 35.00  . Types: Cigarettes  . Quit date: 06/16/1992  . Years since quitting: 28.0  Smokeless Tobacco Never Used   BP Readings from Last 3 Encounters:  06/07/20 112/62  06/05/20 118/76  05/07/20 (!) 154/82   Pulse Readings from Last 3 Encounters:  06/07/20 83  06/05/20 77  05/07/20 66   Wt Readings from Last 3 Encounters:  06/07/20 216 lb (98 kg)  06/05/20 216 lb (98 kg)  05/03/20 217 lb 3.2 oz (98.5 kg)    Assessment: Review of patient past medical history,  allergies, medications, health status, including review of consultants reports, laboratory and other test data, was performed as part of comprehensive evaluation and provision of chronic care management services.   SDOH:  (Social Determinants of Health) assessments and interventions performed: Yes  CCM Care Plan Allergies  Allergen Reactions  . 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 Bromfed Nausea And Vomiting  . Clarithromycin Other (See Comments)     gastritis  . Codeine Other (See Comments)    Trouble breathing  . Doxycycline Hyclate Nausea And Vomiting  . Modafinil Other (See Comments)     anxiety-nervousness  . Oxycodone-Acetaminophen Itching  . Promethazine Hcl Other (See Comments)     fainting  . Quinolones Nausea Only    Cipro "felt real bad"  . Telithromycin Nausea And Vomiting  . Hydrocodone-Acetaminophen Rash  . Statins Other (See Comments)    Leg cramps and makes patient feel bad. Per pt tried multiple in years past  . Sulfonamide Derivatives Rash   Medications Reviewed Today    Reviewed by Madelin Rear, [facility] (Pharmacist) on 06/10/20 at 1112  Med List Status: <None>  Medication Order Taking? Sig Documenting Provider Last Dose Status Informant  acetaminophen (TYLENOL) 500 MG tablet [id number] No Take 1,000 mg by mouth every 6 (six) hours as needed for moderate pain or headache. [provider] Taking Active Self  albuterol (PROAIR HFA) 108 (90 BASE) MCG/ACT inhaler [id number] No Inhale 1-2 puffs into the lungs every 6 (six) hours as needed for wheezing or shortness of breath. [name], MD Taking Active Self  amLODipine (NORVASC) 5 MG tablet [id number] No TAKE 1 TABLET BY MOUTH EVERY DAY  Patient taking differently: Take 5 mg by mouth daily.   Kroeger, Lorelee Cover., PA-C Taking Active   aspirin EC 81 MG tablet [id number] No Take 81 mg by mouth at bedtime.  [provider] Taking Active Self   cholecalciferol (VITAMIN D3) 25 MCG (1000 UNIT) tablet [id number] No Take 1,000 Units by mouth daily. [provider] Taking Active Self  clotrimazole-betamethasone (LOTRISONE) cream [id number] No Apply 1 application topically See admin instructions. Twice daily every 4 days [provider] Taking Active Self  Cyanocobalamin (B-12) 2500 MCG TABS [id number] No Take 1,250 mcg by mouth daily. [provider] Taking Active Self  diazepam (VALIUM) 5 MG tablet [id number] No TAKE 1 TABLET (5 MG TOTAL) BY MOUTH AT BEDTIME AS NEEDED.  Patient taking differently: Take 5 mg by mouth at bedtime as needed (sleep).   [name], MD Taking Active   EPINEPHrine 0.3 mg/0.3  mL IJ SOAJ injection [id number] No Inject 0.3 mg into the muscle as needed for anaphylaxis. for allergic reaction [provider] Taking Active Self  ezetimibe (ZETIA) 10 MG tablet [id number] No Take 1 tablet (10 mg total) by mouth daily. Pixie Casino, MD Taking Expired 05/09/20 2359   FLOVENT HFA 110 MCG/ACT inhaler [id number] No Inhale 1 puff into the lungs 2 (two) times daily.  [provider] Taking Active Self           Med Note Nat Christen   Fri Dec 23, 2018  1:29 PM)    fluticasone (FLONASE) 50 MCG/ACT nasal spray [id number] No Place 1 spray into both nostrils 2 (two) times daily.  Ricard Dillon, MD Taking Active Self  folic acid (FOLVITE) 810 MCG tablet [id number] No Take 800 mcg by mouth daily.  [provider] Taking Active Self  gabapentin (NEURONTIN) 300 MG capsule [id number] No Take 1 cap in AM, 1 cap at noon, 2 caps at bedtime  Patient taking differently: Take 300-600 mg by mouth See admin instructions. Take 300 mg in the morning, 300 mg at noon, and 600 mg at bedtime   Cameron Sprang, MD Taking Active   Glucos-Chond-Hyal Ac-Ca Fructo (MOVE FREE JOINT HEALTH ADVANCE PO) [id number] No Take 1 tablet by mouth daily. [provider] Taking Active Self   hydrochlorothiazide (HYDRODIURIL) 25 MG tablet [id number] No TAKE 1 TABLET BY MOUTH EVERY DAY  Patient taking differently: Take 25 mg by mouth daily.   [name], MD Taking Active   ketoconazole (NIZORAL) 2 % cream [id number] No Apply 1 application topically See admin instructions. Twice a day every other day [provider] Taking Active Self  loratadine (CLARITIN) 10 MG tablet [id number] No Take 10 mg by mouth at bedtime.  [provider] Taking Active Self  losartan (COZAAR) 100 MG tablet [id number] No TAKE 1 TABLET BY MOUTH EVERY DAY  Patient taking differently: Take 100 mg by mouth daily.   [name], MD Taking Active   Miconazole Nitrate (LOTRIMIN AF DEODORANT POWDER) 2 % AERP [id number] No Apply 1 spray topically daily as needed (jock itch). [provider] Taking Active Self  nitroGLYCERIN (NITROSTAT) 0.4 MG SL tablet [id number] No PLACE 1 TABLET UNDER THE TONGUE EVERY 5 (FIVE) MINUTES AS NEEDED.  Patient taking differently: Place 0.4 mg under the tongue every 5 (five) minutes as needed for chest pain.   [name], MD Taking Active   nystatin-triamcinolone ointment [facility]) [id number] No Apply 1 application topically See admin instructions. Twice daily every 4 days [provider] Taking Active Self  pantoprazole (PROTONIX) 40 MG tablet [id number] No TAKE 1 TABLET BY MOUTH EVERY DAY  Patient taking differently: Take 40 mg by mouth daily.   Kroeger, Lorelee Cover., PA-C Taking Active   polyethylene glycol powder [facility]) powder [id number] No Take 17 g by mouth daily.  Patient taking differently: Take 17 g by mouth in the morning and at bedtime.   Tanna Furry, MD Taking Active   simvastatin (ZOCOR) 20 MG tablet [id number] No TAKE 1 TABLET BY MOUTH EVERYDAY AT BEDTIME  Patient taking differently: Take 20 mg by mouth at bedtime.   [name], MD Taking Active   traMADol (ULTRAM) 50 MG tablet [id number] No Take 1 tablet (50 mg  total) by mouth every 6 (six) hours as needed.  Patient taking differently: Take 50 mg by mouth every 6 (six) hours as needed for moderate pain.   Johnathan Hausen,  MD Taking Active            Med Note Terrilyn Saver May 29, 2020 12:16 PM)    Wheat Dextrin [facility]) POWD [id number] No Take 1 Dose by mouth daily. 1 dose = 2 teaspoons [provider] Taking Active Self         Patient Active Problem List   Diagnosis Date Noted  . Pre-operative respiratory examination 06/05/2020  . Coronary artery disease involving coronary bypass graft of native heart without angina pectoris 04/07/2020  . Trigger thumb, left thumb 01/24/2020  . Overactive bladder 11/24/2019  . Left lateral epicondylitis 09/05/2019  . Degenerative arthritis of left knee 08/08/2019  . Educated about COVID-19 virus infection 04/20/2019  . Unstable angina ([place])   . Greater trochanteric bursitis of left hip 10/27/2018  . Degenerative disc disease, lumbar 10/27/2018  . Abdominal muscle strain, initial encounter 06/16/2018  . Aortic atherosclerosis ([place]) 05/05/2018  . Hyperglycemia 10/07/2017  . Weakness of left leg 06/21/2017  . DJD (degenerative joint disease)   . Arthritis   . Neck pain 02/08/2017  . Venous insufficiency 12/29/2016  . Ganglion cyst of tendon sheath of right hand 12/29/2016  . Avulsion fracture of metatarsal bone of right foot 11/25/2016  . Gout 11/20/2016  . Obesity, Class I, BMI 30-34.9 10/03/2015  . Hereditary and idiopathic peripheral neuropathy 09/25/2014  . Allergic rhinitis 05/31/2014  . Fatigue 11/09/2013  . CKD (chronic kidney disease), stage III ([place]) 10/23/2013  . Dizziness 08/23/2013  . Neuropathy ([place]) 08/21/2013  . RLS (restless legs syndrome) 01/03/2013  . Chronic bilateral low back pain with left-sided sciatica 10/09/2011  . History of bladder cancer 04/01/2010  . OSTEOARTHRITIS, LOWER LEG, LEFT 10/04/2009  . ANEMIA, IRON DEFICIENCY 11/06/2008  . 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 Essential hypertension 02/21/2007  . OSA (obstructive sleep apnea) 11/02/2006  . Hyperlipidemia 10/12/2006  . 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 Coronary atherosclerosis 10/12/2006  . Asthma 10/12/2006  . GERD 10/12/2006  . Irritable bowel syndrome 10/12/2006   Immunization History  Administered Date(s) Administered  . Fluad Quad(high Dose 65+) 11/21/2018  . Influenza Split 01/08/2012, 11/12/2014  . Influenza Whole 12/08/2007, 12/26/2008, 12/22/2010  . Influenza, High Dose Seasonal PF 11/16/2015, 01/02/2016, 01/01/2017, 01/06/2018, 02/01/2019, 11/24/2019, 01/23/2020  . Influenza,inj,Quad PF,6+ Mos 11/30/2012  . Influenza-Unspecified 01/12/2014, 12/21/2017  . PFIZER(Purple Top)SARS-COV-2 Vaccination 04/28/2019, 05/23/2019, 12/21/2019, 01/23/2020  . Pneumococcal Conjugate-13 05/31/2014  . Pneumococcal Polysaccharide-23 06/04/2006, 07/15/2012, 01/02/2016, 12/31/2016, 01/23/2020  . Td 04/23/2005  . Tdap 02/18/2016  . Zoster 01/08/2012  . Zoster Recombinat (Shingrix) 01/31/2018, 04/02/2018   Conditions to be addressed/monitored: CAD, HTN, HLD and CKD Stage III  Care Plan : CCM Pharmacy Care Plan  Updates made by Madelin Rear, [facility] since 06/10/2020 12:00 AM    Problem: CAD, HTN, HLD and CKD Stage III   Priority: High    Long-Range Goal: Disease Management   Start Date: 06/10/2020  Expected End Date: 06/10/2021  This Visit's Progress: On track  Priority: High  Note:   Hypertension (BP goal <130/80) -Controlled -Current treatment: . Losartan 100 mg once daily  . Amlodipine 5 mg once daily in the morning . HCTZ 25 mg once daily in the afternoon -Previous medications: Benicar-HCT 20-12.5 & 40-25  mg, irbesartan 300 mg, losartan 50 mg, losartan-HCTZ 100-25 mg, metoprolol tartrate 25 mg, valsartan-HCTZ 320-25 mg.   -Current readings: 409W systolic at recent OVs -Current dietary habits: does not like vegetables, actively  tries to avoid sweets -Current exercise habits: would like to lift weights more/use cardio equipment at [facility] but limited due to back/knee pain. Has knee surgery 06/18/2020. -Denies hypotensive/hypertensive symptoms -Educated on BP goals and benefits of medications for prevention of heart attack, stroke and kidney damage; Exercise goal of 150 minutes per week; -Counseled to monitor BP at home, document, and provide log at future appointments -Counseled on diet and exercise extensively  Hyperlipidemia: (LDL goal < 70) -Not ideally controlled  -LDL 84 (04/16/2020), trying to work towards goal with increased physical activity, inclisiran may be considered at f/u with Dr Debara Pickett. Discussed patient assistance through [employer] as a possibility.    -Current treatment:  Simvastatin 20 mg once daily in evening  Zetia 10 mg once daily in the afternoon -Previous medications: Repatha, atorvastatin 40 mg, rosuvastatin 10 & 20 mg, simvastatin 40 mg.  -Current dietary patterns: see htn  -Current exercise habits: see htn -Educated on Cholesterol goals;  Exercise goal of 150 minutes per week; -Counseled on diet and exercise extensively Recommended to continue current medication Patient assistance form to be prefilled and sent to patient. Patient will bring to next f/u with Dr. Debara Pickett in case they wish to pursue.   Patient Goals/Self-Care Activities . Over the next 365 days, patient will:  - take medications as prescribed collaborate with provider on medication access solutions target a minimum of 150 minutes of moderate intensity exercise weekly  Medication Assistance: Application for inclisiran  medication assistance program. in process.  Anticipated assistance start date TBD .  See plan of care for additional detail.    Current Barriers:  . Unable to independently afford treatment regimen . Suboptimal therapeutic regimen for hyperlipidemia   Pharmacist Clinical Goal(s):  [name] Over the next 365 days,  patient will verbalize ability to afford treatment regimen . achieve adherence to monitoring guidelines and medication adherence to achieve therapeutic efficacy . contact provider office for questions/concerns as evidenced notation of same in electronic health record through collaboration with PharmD and provider.   Interventions: . 1:1 collaboration with [name], MD regarding development and update of comprehensive plan of care as evidenced by provider attestation and co-signature . Inter-disciplinary care team collaboration (see longitudinal plan of care) . Comprehensive medication review performed; medication list updated in electronic medical record  Patient's preferred pharmacy is:  [facility], [place] [address] Phone: [phone] Fax: [phone]  [facility] [place], [place] [address] [place] [address] Phone: [phone] Fax: [place] [address] [place] [address] Phone: [phone] Fax: [phone]  [facility] [place], [place] - [address] [place] [address] Phone: [phone] Fax: [phone]  Pt endorses 100% compliance  Follow Up:  Patient agrees to Care Plan and Follow-up. Plan: Telephone follow up appointment with care management team member scheduled for:  08/2020. CPA to send pt PAP form.  Future Appointments  Date Time Provider [place]  06/11/2020  9:00 AM [facility] PAT 3 [facility] None  06/13/2020 10:00 AM [name], MD [facility] PEC  06/26/2020 11:00 AM [name], MD [facility] PEC  09/11/2020 11:00 AM [facility] CCM PHARMACIST [facility]  01/08/2021  8:30 AM Cameron Sprang, MD [facility] None  05/15/2021  9:30 AM [facility] HEALTH COACH [facility]   Madelin Rear, Pharm.D., BCGP Clinical Pharmacist [place]  [facility]  [place] [phone]

## 2020-06-10 NOTE — Telephone Encounter (Signed)
Called spoke with patient.  Patient states talk with Eustaquio Maize and she was going to look into getting a sample nasal mask for patient. Let patient know that we will call him back when we hear from [facility] or locate the nasal sample mask.  Sending to [facility] for further recommendations  If don't answer he is getting blood test and he will call back. He states just leave him a detailed message.

## 2020-06-10 NOTE — Patient Instructions (Addendum)
DUE TO COVID-19 ONLY ONE VISITOR IS ALLOWED TO COME WITH YOU AND STAY IN THE WAITING ROOM ONLY DURING PRE OP AND PROCEDURE DAY OF SURGERY. THE 1 VISITOR  MAY VI SIT WITH YOU AFTER SURGERY IN YOUR PRIVATE ROOM DURING VISITING HOURS ONLY!  YOU NEED TO HAVE A COVID 19 TEST ON___3/25___ @_9 :00______, THIS TEST MUST BE DONE BEFORE SURGERY,  COVID TESTING SITE [place] [id number], IT IS ON THE RIGHT GOING OUT [address] APPROXIMATELY  2 MINUTES PAST [employer] ON THE RIGHT. ONCE YOUR COVID TEST IS COMPLETED,  PLEASE BEGIN THE QUARANTINE INSTRUCTIONS AS OUTLINED IN YOUR HANDOUT.                Joshua [name]   Your procedure is scheduled on: 06/18/20   Report to [facility] Main  Entrance   Report to Short stay at 5:30 AM     Call this number if you have problems the morning of surgery [phone]    . BRUSH YOUR TEETH MORNING OF SURGERY AND RINSE YOUR MOUTH OUT, NO CHEWING GUM CANDY OR MINTS.   No food after midnight.    You may have clear liquid until 4:30 AM.    At 4:00 AM drink pre surgery drink.   Nothing by mouth after 4:30 AM.   Take these medicines the morning of surgery with A SIP OF WATER: Gabapentin, Amlodipine, Protonix. Use your inhalers and bring with you to the hospital.                                 You may not have any metal on your body including              piercings  Do not wear jewelry, , lotions, powders or dodorant              Men may shave face and neck.   Do not bring valuables to the hospital. [place].  Contacts, dentures or bridgework may not be worn into surgery. .     Patients discharged the day of surgery will not be allowed to drive home.   IF YOU ARE HAVING SURGERY AND GOING HOME THE SAME DAY, YOU MUST HAVE AN ADULT TO DRIVE YOU HOME AND BE WITH YOU FOR 24 HOURS.  YOU MAY GO HOME BY TAXI OR UBER OR ORTHERWISE, BUT AN ADULT MUST ACCOMPANY YOU  HOME AND STAY WITH YOU FOR 24 HOURS.  Name and phone number of your driver:  Special Instructions: N/A              Please read over the following fact sheets you were given: _____________________________________________________________________             [facility] - Preparing for Surgery Before surgery, you can play an important role.  Because skin is not sterile, your skin needs to be as free of germs as possible.  You can reduce the number of germs on your skin by washing with CHG (chlorahexidine gluconate) soap before surgery.  CHG is an antiseptic cleaner which kills germs and bonds with the skin to continue killing germs even after washing. Please DO NOT use if you have an allergy to CHG or antibacterial soaps.  If your skin becomes reddened/irritated stop using the CHG and  inform your nurse when you arrive at Short Stay. .  You may shave your face/neck.  Please follow these instructions carefully:  1.  Shower with CHG Soap the night before surgery and the  morning of Surgery.  2.  If you choose to wash your hair, wash your hair first as usual with your  normal  shampoo.  3.  After you shampoo, rinse your hair and body thoroughly to remove the  shampoo.                                        4.  Use CHG as you would any other liquid soap.  You can apply chg directly  to the skin and wash                       Gently with a scrungie or clean washcloth.  5.  Apply the CHG Soap to your body ONLY FROM THE NECK DOWN.   Do not use on face/ open                           Wound or open sores. Avoid contact with eyes, ears mouth and genitals (private parts).                       Wash face,  Genitals (private parts) with your normal soap.             6.  Wash thoroughly, paying special attention to the area where your surgery  will be performed.  7.  Thoroughly rinse your body with warm water from the neck down.  8.  DO NOT shower/wash with your normal soap after using and rinsing off  the  CHG Soap.             9.  Pat yourself dry with a clean towel.            10.  Wear clean pajamas.            11.  Place clean sheets on your bed the night of your first shower and do not  sleep with pets. Day of Surgery : Do not apply any lotions/deodorants the morning of surgery.  Please wear clean clothes to the hospital/surgery center.  FAILURE TO FOLLOW THESE INSTRUCTIONS MAY RESULT IN THE CANCELLATION OF YOUR SURGERY PATIENT SIGNATURE_________________________________  NURSE SIGNATURE__________________________________  ________________________________________________________________________   Joshua [name]  An incentive spirometer is a tool that can help keep your lungs clear and active. This tool measures how well you are filling your lungs with each breath. Taking long deep breaths may help reverse or decrease the chance of developing breathing (pulmonary) problems (especially infection) following:  A long period of time when you are unable to move or be active. BEFORE THE PROCEDURE   If the spirometer includes an indicator to show your best effort, your nurse or respiratory therapist will set it to a desired goal.  If possible, sit up straight or lean slightly forward. Try not to slouch.  Hold the incentive spirometer in an upright position. INSTRUCTIONS FOR USE  1. Sit on the edge of your bed if possible, or sit up as far as you can in bed or on a chair. 2. Hold the incentive spirometer in an upright position. 3. Breathe out normally. 4. Place  the mouthpiece in your mouth and seal your lips tightly around it. 5. Breathe in slowly and as deeply as possible, raising the piston or the ball toward the top of the column. 6. Hold your breath for 3-5 seconds or for as long as possible. Allow the piston or ball to fall to the bottom of the column. 7. Remove the mouthpiece from your mouth and breathe out normally. 8. Rest for a few seconds and repeat Steps 1 through 7 at  least 10 times every 1-2 hours when you are awake. Take your time and take a few normal breaths between deep breaths. 9. The spirometer may include an indicator to show your best effort. Use the indicator as a goal to work toward during each repetition. 10. After each set of 10 deep breaths, practice coughing to be sure your lungs are clear. If you have an incision (the cut made at the time of surgery), support your incision when coughing by placing a pillow or rolled up towels firmly against it. Once you are able to get out of bed, walk around indoors and cough well. You may stop using the incentive spirometer when instructed by your caregiver.  RISKS AND COMPLICATIONS  Take your time so you do not get dizzy or light-headed.  If you are in pain, you may need to take or ask for pain medication before doing incentive spirometry. It is harder to take a deep breath if you are having pain. AFTER USE  Rest and breathe slowly and easily.  It can be helpful to keep track of a log of your progress. Your caregiver can provide you with a simple table to help with this. If you are using the spirometer at home, follow these instructions: [place] IF:   You are having difficultly using the spirometer.  You have trouble using the spirometer as often as instructed.  Your pain medication is not giving enough relief while using the spirometer.  You develop fever of 100.5 F (38.1 C) or higher. SEEK IMMEDIATE MEDICAL CARE IF:   You cough up bloody sputum that had not been present before.  You develop fever of 102 F (38.9 C) or greater.  You develop worsening pain at or near the incision site. MAKE SURE YOU:   Understand these instructions.  Will watch your condition.  Will get help right away if you are not doing well or get worse. Document Released: 07/20/2006 Document Revised: 06/01/2011 Document Reviewed: 09/20/2006 [facility] Patient Information 2014 [place],  [place].   ________________________________________________________________________

## 2020-06-10 NOTE — Telephone Encounter (Signed)
We sent a DME order in for [employer] dreamwisp full face mask size medium

## 2020-06-10 NOTE — Chronic Care Management (AMB) (Addendum)
Chronic Care Management Pharmacy Assistant   Name: KHRYSTIAN SCHAUF  MRN: [id number] DOB: 03/12/1941  Reason for Encounter: Patient Assistance Documentation  Medications: Outpatient Encounter Medications as of 06/10/2020  Medication Sig   acetaminophen (TYLENOL) 500 MG tablet Take 1,000 mg by mouth every 6 (six) hours as needed for moderate pain or headache.   albuterol (PROAIR HFA) 108 (90 BASE) MCG/ACT inhaler Inhale 1-2 puffs into the lungs every 6 (six) hours as needed for wheezing or shortness of breath.   amLODipine (NORVASC) 5 MG tablet TAKE 1 TABLET BY MOUTH EVERY DAY (Patient taking differently: Take 5 mg by mouth daily.)   aspirin EC 81 MG tablet Take 81 mg by mouth at bedtime.    cholecalciferol (VITAMIN D3) 25 MCG (1000 UNIT) tablet Take 1,000 Units by mouth daily.   clotrimazole-betamethasone (LOTRISONE) cream Apply 1 application topically See admin instructions. Twice daily every 4 days   Cyanocobalamin (B-12) 2500 MCG TABS Take 1,250 mcg by mouth daily.   diazepam (VALIUM) 5 MG tablet TAKE 1 TABLET (5 MG TOTAL) BY MOUTH AT BEDTIME AS NEEDED. (Patient taking differently: Take 5 mg by mouth at bedtime as needed (sleep).)   EPINEPHrine 0.3 mg/0.3 mL IJ SOAJ injection Inject 0.3 mg into the muscle as needed for anaphylaxis. for allergic reaction   ezetimibe (ZETIA) 10 MG tablet Take 1 tablet (10 mg total) by mouth daily.   FLOVENT HFA 110 MCG/ACT inhaler Inhale 1 puff into the lungs 2 (two) times daily.    fluticasone (FLONASE) 50 MCG/ACT nasal spray Place 1 spray into both nostrils 2 (two) times daily.    folic acid (FOLVITE) 030 MCG tablet Take 800 mcg by mouth daily.    gabapentin (NEURONTIN) 300 MG capsule Take 1 cap in AM, 1 cap at noon, 2 caps at bedtime (Patient taking differently: Take 300-600 mg by mouth See admin instructions. Take 300 mg in the morning, 300 mg at noon, and 600 mg at bedtime)   Glucos-Chond-Hyal Ac-Ca Fructo (MOVE FREE JOINT HEALTH ADVANCE PO) Take 1  tablet by mouth daily.   hydrochlorothiazide (HYDRODIURIL) 25 MG tablet TAKE 1 TABLET BY MOUTH EVERY DAY (Patient taking differently: Take 25 mg by mouth daily.)   ketoconazole (NIZORAL) 2 % cream Apply 1 application topically See admin instructions. Twice a day every other day   loratadine (CLARITIN) 10 MG tablet Take 10 mg by mouth at bedtime.    losartan (COZAAR) 100 MG tablet TAKE 1 TABLET BY MOUTH EVERY DAY (Patient taking differently: Take 100 mg by mouth daily.)   Miconazole Nitrate (LOTRIMIN AF DEODORANT POWDER) 2 % AERP Apply 1 spray topically daily as needed (jock itch).   nitroGLYCERIN (NITROSTAT) 0.4 MG SL tablet PLACE 1 TABLET UNDER THE TONGUE EVERY 5 (FIVE) MINUTES AS NEEDED. (Patient taking differently: Place 0.4 mg under the tongue every 5 (five) minutes as needed for chest pain.)   nystatin-triamcinolone ointment (MYCOLOG) Apply 1 application topically See admin instructions. Twice daily every 4 days   pantoprazole (PROTONIX) 40 MG tablet TAKE 1 TABLET BY MOUTH EVERY DAY (Patient taking differently: Take 40 mg by mouth daily.)   polyethylene glycol powder (GLYCOLAX/MIRALAX) powder Take 17 g by mouth daily. (Patient taking differently: Take 17 g by mouth in the morning and at bedtime.)   simvastatin (ZOCOR) 20 MG tablet TAKE 1 TABLET BY MOUTH EVERYDAY AT BEDTIME (Patient taking differently: Take 20 mg by mouth at bedtime.)   traMADol (ULTRAM) 50 MG tablet Take 1 tablet (50 mg  total) by mouth every 6 (six) hours as needed. (Patient taking differently: Take 50 mg by mouth every 6 (six) hours as needed for moderate pain.)   Wheat Dextrin (BENEFIBER) POWD Take 1 Dose by mouth daily. 1 dose = 2 teaspoons   No facility-administered encounter medications on file as of 06/10/2020.     Patient assistance application prefilled for medication [place]io through [employer]. Application will be mailed out to the patient. Patient is to completed application and return to Dr.  Lysbeth Penner office.  April D Calhoun, [place] Pharmacist Assistant [phone]

## 2020-06-10 NOTE — Telephone Encounter (Signed)
I do not think we have any more samples of that type of mask. Could he pay out of pocked through DME company or try [email or web address] ?

## 2020-06-10 NOTE — Patient Instructions (Signed)
Mr. Joshua [name],  Thank you for taking the time to review your medications with me today.  I have included our care plan/goals in the following pages. Please review and call me at [phone] with any questions!  Thanks! Ellin Mayhew, Pharm.D., BCGP Clinical Pharmacist [facility] [place] [phone] Patient Care Plan: [place] Plan    Problem Identified: CAD, HTN, HLD and CKD Stage III   Priority: High    Long-Range Goal: Disease Management   Start Date: 06/10/2020  Expected End Date: 06/10/2021  This Visit's Progress: On track  Priority: High  Note:   Hypertension (BP goal <130/80) -Controlled -Current treatment: . Losartan 100 mg once daily  . Amlodipine 5 mg once daily in the morning . HCTZ 25 mg once daily in the afternoon -Previous medications: Benicar-HCT 20-12.5 & 40-25  mg, irbesartan 300 mg, losartan 50 mg, losartan-HCTZ 100-25 mg, metoprolol tartrate 25 mg, valsartan-HCTZ 320-25 mg.   -Current readings: 256L systolic at recent OVs -Current dietary habits: does not like vegetables, actively tries to avoid sweets -Current exercise habits: would like to lift weights more/use cardio equipment at [facility] but limited due to back/knee pain. Has knee surgery 06/18/2020. -Denies hypotensive/hypertensive symptoms -Educated on BP goals and benefits of medications for prevention of heart attack, stroke and kidney damage; Exercise goal of 150 minutes per week; -Counseled to monitor BP at home, document, and provide log at future appointments -Counseled on diet and exercise extensively  Hyperlipidemia: (LDL goal < 70) -Not ideally controlled  -LDL 84 (04/16/2020), trying to work towards goal with increased physical activity, inclisiran may be considered at f/u with Dr Debara Pickett. Discussed patient assistance through [employer] as a possibility.    -Current treatment:  Simvastatin 20 mg once daily in evening  Zetia 10 mg once  daily in the afternoon -Previous medications: Repatha, atorvastatin 40 mg, rosuvastatin 10 & 20 mg, simvastatin 40 mg.  -Current dietary patterns: see htn  -Current exercise habits: see htn -Educated on Cholesterol goals;  Exercise goal of 150 minutes per week; -Counseled on diet and exercise extensively Recommended to continue current medication Patient assistance form to be prefilled and sent to patient. Patient will bring to next f/u with Dr. Debara Pickett in case they wish to pursue.   Patient Goals/Self-Care Activities . Over the next 365 days, patient will:  - take medications as prescribed collaborate with provider on medication access solutions target a minimum of 150 minutes of moderate intensity exercise weekly  Medication Assistance: Application for inclisiran  medication assistance program. in process.  Anticipated assistance start date TBD .  See plan of care for additional detail.      The patient verbalized understanding of instructions provided today and agreed to receive a MyChart copy of patient instruction and/or educational materials. Telephone follow up appointment with pharmacy team member scheduled for: See next appointment with "Care Management Staff" under "What's Next" below.

## 2020-06-10 NOTE — Telephone Encounter (Signed)
Insurance will not approve it until June 2022 since he can only get supplies once a year.

## 2020-06-11 ENCOUNTER — Other Ambulatory Visit: Payer: Self-pay

## 2020-06-11 ENCOUNTER — Encounter (HOSPITAL_COMMUNITY)
Admission: RE | Admit: 2020-06-11 | Discharge: 2020-06-11 | Disposition: A | Discharge status: Home / Self Care | Payer: PPO | Source: Ambulatory Visit | Attending: Orthopaedic Surgery | Admitting: Orthopaedic Surgery

## 2020-06-11 ENCOUNTER — Ambulatory Visit (HOSPITAL_COMMUNITY)
Admission: RE | Admit: 2020-06-11 | Discharge: 2020-06-11 | Disposition: A | Discharge status: Home / Self Care | Payer: PPO | Source: Ambulatory Visit | Attending: Orthopaedic Surgery | Admitting: Orthopaedic Surgery

## 2020-06-11 ENCOUNTER — Encounter (HOSPITAL_COMMUNITY): Payer: Self-pay

## 2020-06-11 DIAGNOSIS — N183 Chronic kidney disease, stage 3 unspecified: Secondary | ICD-10-CM | POA: Diagnosis not present

## 2020-06-11 DIAGNOSIS — Z79899 Other long term (current) drug therapy: Secondary | ICD-10-CM | POA: Diagnosis not present

## 2020-06-11 DIAGNOSIS — J45909 Unspecified asthma, uncomplicated: Secondary | ICD-10-CM | POA: Insufficient documentation

## 2020-06-11 DIAGNOSIS — Z87891 Personal history of nicotine dependence: Secondary | ICD-10-CM | POA: Diagnosis not present

## 2020-06-11 DIAGNOSIS — I2510 Atherosclerotic heart disease of native coronary artery without angina pectoris: Secondary | ICD-10-CM | POA: Insufficient documentation

## 2020-06-11 DIAGNOSIS — G4733 Obstructive sleep apnea (adult) (pediatric): Secondary | ICD-10-CM | POA: Insufficient documentation

## 2020-06-11 DIAGNOSIS — I10 Essential (primary) hypertension: Secondary | ICD-10-CM | POA: Diagnosis not present

## 2020-06-11 DIAGNOSIS — Z01818 Encounter for other preprocedural examination: Secondary | ICD-10-CM | POA: Diagnosis not present

## 2020-06-11 DIAGNOSIS — Z7982 Long term (current) use of aspirin: Secondary | ICD-10-CM | POA: Insufficient documentation

## 2020-06-11 DIAGNOSIS — I129 Hypertensive chronic kidney disease with stage 1 through stage 4 chronic kidney disease, or unspecified chronic kidney disease: Secondary | ICD-10-CM | POA: Insufficient documentation

## 2020-06-11 DIAGNOSIS — M1712 Unilateral primary osteoarthritis, left knee: Secondary | ICD-10-CM | POA: Insufficient documentation

## 2020-06-11 HISTORY — DX: Prediabetes: R73.03

## 2020-06-11 HISTORY — DX: Peripheral vascular disease, unspecified: I73.9

## 2020-06-11 LAB — CBC WITH DIFFERENTIAL/PLATELET
Abs Immature Granulocytes: 0.12 10*3/uL — ABNORMAL HIGH (ref 0.00–0.07)
Basophils Absolute: 0.1 10*3/uL (ref 0.0–0.1)
Basophils Relative: 1 %
Eosinophils Absolute: 0.2 10*3/uL (ref 0.0–0.5)
Eosinophils Relative: 3 %
HCT: 44.4 % (ref 39.0–52.0)
Hemoglobin: 15.1 g/dL (ref 13.0–17.0)
Immature Granulocytes: 2 %
Lymphocytes Relative: 18 %
Lymphs Abs: 1.2 10*3/uL (ref 0.7–4.0)
MCH: 30.7 pg (ref 26.0–34.0)
MCHC: 34 g/dL (ref 30.0–36.0)
MCV: 90.2 fL (ref 80.0–100.0)
Monocytes Absolute: 0.7 10*3/uL (ref 0.1–1.0)
Monocytes Relative: 10 %
Neutro Abs: 4.7 10*3/uL (ref 1.7–7.7)
Neutrophils Relative %: 66 %
Platelets: 207 10*3/uL (ref 150–400)
RBC: 4.92 MIL/uL (ref 4.22–5.81)
RDW: 12.3 % (ref 11.5–15.5)
WBC: 7 10*3/uL (ref 4.0–10.5)
nRBC: 0 % (ref 0.0–0.2)

## 2020-06-11 LAB — URINALYSIS, ROUTINE W REFLEX MICROSCOPIC
Bilirubin Urine: NEGATIVE
Glucose, UA: NEGATIVE mg/dL
Hgb urine dipstick: NEGATIVE
Ketones, ur: NEGATIVE mg/dL
Leukocytes,Ua: NEGATIVE
Nitrite: NEGATIVE
Protein, ur: NEGATIVE mg/dL
Specific Gravity, Urine: 1.011 (ref 1.005–1.030)
pH: 6 (ref 5.0–8.0)

## 2020-06-11 LAB — SURGICAL PCR SCREEN
MRSA, PCR: NEGATIVE
Staphylococcus aureus: NEGATIVE

## 2020-06-11 LAB — HEMOGLOBIN A1C
Hgb A1c MFr Bld: 6.2 % — ABNORMAL HIGH (ref 4.8–5.6)
Mean Plasma Glucose: 131.24 mg/dL

## 2020-06-11 LAB — APTT: aPTT: 36 seconds (ref 24–36)

## 2020-06-11 LAB — BASIC METABOLIC PANEL
Anion gap: 9 (ref 5–15)
BUN: 17 mg/dL (ref 8–23)
CO2: 28 mmol/L (ref 22–32)
Calcium: 10.2 mg/dL (ref 8.9–10.3)
Chloride: 102 mmol/L (ref 98–111)
Creatinine, Ser: 1.32 mg/dL — ABNORMAL HIGH (ref 0.61–1.24)
GFR, Estimated: 56 mL/min — ABNORMAL LOW (ref >60)
Glucose, Bld: 98 mg/dL (ref 70–99)
Potassium: 4 mmol/L (ref 3.5–5.1)
Sodium: 139 mmol/L (ref 135–145)

## 2020-06-11 LAB — PROTIME-INR
INR: 1 (ref 0.8–1.2)
Prothrombin Time: 12.5 seconds (ref 11.4–15.2)

## 2020-06-11 NOTE — Progress Notes (Addendum)
COVID Vaccine Completed:yes Date COVID Vaccine completed:05/23/19-booster 7/21 COVID vaccine manufacturer: Pfizer     PCP - Dr. Ansel Bong Cardiologist - Dr. Percival Spanish  Chest x-ray - no EKG - 06/11/20-chart, epic Stress Test - 04/26/18-epic ECHO - 2014-epic Cardiac Cath - 04/13/19 with stent-epic, CABG x5 2001 Pacemaker/ICD device last checked:NA  Sleep Study - yes CPAP - yes  Fasting Blood Sugar - NA Checks Blood Sugar _____ times a day  Blood Thinner Instructions:ASA81/ Dr. Percival Spanish Aspirin Instructions:continue taking Last Dose:  Anesthesia review:   Patient denies shortness of breath, fever, cough and chest pain at PAT appointment yes  Patient verbalized understanding of instructions that were given to them at the PAT appointment. Patient was also instructed that they will need to review over the PAT instructions again at home before surgery.yes  Pt reports no SOB climbing stairs, doing work or with ADLs. He has peripheral neuropathy on both legs and can't tolerated ted hose or foot pumps. He can tolerate compression wraps.

## 2020-06-11 NOTE — Care Plan (Signed)
Ortho Bundle Case Management Note  Patient Details  Name: Joshua [name] MRN: [id number] Date of Birth: 04-Apr-1940  Spoke with patient and wife prior to surgery. He will discharge to home with her to assist. Rolling walker ordered. HHPT referral to [facility]. OPPT set up with [facility]. Patient and MD in agreement with plan. Choice offered.                 DME Arranged:  [place] R[place] rolling DME Agency:  [facility]  HH Arranged:  PT [place] Agency:  [facility]  Additional Comments: Please contact me with any questions of if this plan should need to change.  Ladell Heads,  [place] Specialist  [phone] 06/11/2020, 11:35 AM

## 2020-06-11 NOTE — Telephone Encounter (Signed)
Patient is returning phone call. Patient phone number is [phone].

## 2020-06-11 NOTE — Telephone Encounter (Signed)
I have called and LM on VM for the pt to call back 

## 2020-06-11 NOTE — Telephone Encounter (Signed)
LMTCB for pt 

## 2020-06-12 DIAGNOSIS — L821 Other seborrheic keratosis: Secondary | ICD-10-CM | POA: Diagnosis not present

## 2020-06-12 DIAGNOSIS — L570 Actinic keratosis: Secondary | ICD-10-CM | POA: Diagnosis not present

## 2020-06-12 DIAGNOSIS — D692 Other nonthrombocytopenic purpura: Secondary | ICD-10-CM | POA: Diagnosis not present

## 2020-06-13 ENCOUNTER — Ambulatory Visit: Payer: PPO | Admitting: Family Medicine

## 2020-06-13 DIAGNOSIS — J3089 Other allergic rhinitis: Secondary | ICD-10-CM | POA: Diagnosis not present

## 2020-06-13 DIAGNOSIS — J3081 Allergic rhinitis due to animal (cat) (dog) hair and dander: Secondary | ICD-10-CM | POA: Diagnosis not present

## 2020-06-13 DIAGNOSIS — J301 Allergic rhinitis due to pollen: Secondary | ICD-10-CM | POA: Diagnosis not present

## 2020-06-13 NOTE — Progress Notes (Signed)
Anesthesia Chart Review   Case: [id number] Date/Time: 06/18/20 0715   Procedure: LEFT TOTAL KNEE ARTHROPLASTY (Left Knee)   Anesthesia type: Spinal   Pre-op diagnosis: LEFT KNEE DEGENERATIVE JOINT DISEASE   Location: [facility] 06 / [facility]   Surgeons: Melrose Nakayama, MD      DISCUSSION:80 y.o. former smoker with h/o GERD, OSA w/CPAP, asthma, CKD Stage III, HTN, CAD (CAB 2001, PCI 1/21), Gleft knee djd scheduled for above procedure 06/18/2020 with Dr. Melrose Nakayama.   Per pulmonology, "Risk stratification score showed patient is low risk from pulmonary standpoint for left knee arthroplasty  Major Pulmonary risks identified in the multifactorial risk analysis are but not limited to a) pneumonia; b) recurrent intubation risk; c) prolonged or recurrent acute respiratory failure needing mechanical ventilation; d) prolonged hospitalization; e) DVT/Pulmonary embolism; f) Acute Pulmonary edema  Recommend 1. Short duration of surgery as much as possible and avoid paralytic if possible 2. Recovery in step down or ICU with Pulmonary consultation if needed  3. DVT prophylaxis 4. Aggressive pulmonary toilet with o2, bronchodilatation, and incentive spirometry and early ambulation"  Per cardiology preoperative evaluation 05/21/20, "RCRI:  Perioperative Risk of Major Cardiac Event is (%): 0.9 (low risk) DASI:  Functional Capacity in METs is: 4.4 (functional status is fair )  Patient was contacted 05/21/2020 in reference to pre-operative risk assessment for pending surgery as outlined below.    Since last seen, Joshua [name] has not had chest pain, shortness of breath, syncope.  Recommendations:  Therefore, based on ACC/AHA guidelines, the patient is at acceptable risk for the planned procedure without further cardiovascular testing.   Ideally, we would recommend continuing aspirin throughout the perioperative period to further reduce CV risk.  However, if the bleeding risk is too great, the  patient may hold aspirin for 7 days prior to his procedure and resume it postoperatively when felt to be safe."  Anticipate pt can proceed with planned procedure barring acute status change.   VS: BP 137/65   Pulse 84   Temp 36.8 C (Oral)   Resp 20   Ht 5\' 6"  (1.676 m)   Wt 97.1 kg   SpO2 97%   BMI 34.54 kg/m   PROVIDERS: [name], MD is PCP   [name], MD is Cardiologist   Chesley Mires, MD is Pulmonologist  LABS: Labs reviewed: Acceptable for surgery. (all labs ordered are listed, but only abnormal results are displayed)  Labs Reviewed  CBC WITH DIFFERENTIAL/PLATELET - Abnormal; Notable for the following components:      Result Value   Abs Immature Granulocytes 0.12 (*)    All other components within normal limits  BASIC METABOLIC PANEL - Abnormal; Notable for the following components:   Creatinine, Ser 1.32 (*)    GFR, Estimated 56 (*)    All other components within normal limits  HEMOGLOBIN A1C - Abnormal; Notable for the following components:   Hgb A1c MFr Bld 6.2 (*)    All other components within normal limits  SURGICAL PCR SCREEN  PROTIME-INR  APTT  URINALYSIS, ROUTINE W REFLEX MICROSCOPIC  TYPE AND SCREEN     IMAGES:   EKG: 06/11/20 Rate 72 bpm  NSR Possible anterior infarct, age undetermined Abnormal ECG Inferior infarct  No significant change since last tracing   CV: Stress Test 04/26/2018  No T wave inversion was noted during stress.  There was no ST segment deviation noted during stress.  Blood pressure demonstrated a normal response to exercise.  Overall low risk stress test without ECG changes suggestive of ischemia.  Echo 05/10/2012 Study Conclusions   Left ventricle: The cavity size was normal. Wall thickness  was increased in a pattern of mild LVH. Systolic function  was normal. The estimated ejection fraction was in the range  of 60% to 65%. Wall motion was normal; there were no  regional wall motion  abnormalities. There was an increased  relative contribution of atrial contraction to ventricular  filling.   Past Medical History:  Diagnosis Date  . Acute medial meniscal tear   . Anemia 2015  . Arthritis    "middle finger right hand; right knee; neck" (02/06/2014)  . Asthma    seasonal  . 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 CAD (coronary artery disease)    CABG 2001  . Diverticulosis   . DJD (degenerative joint disease)    BACK  . GERD (gastroesophageal reflux disease)   . H/O inguinal hernia repair 12/2018  . History of gout   . Hyperlipidemia   . Hypertension   . IBS (irritable bowel syndrome)   . Obesity   . OSA (obstructive sleep apnea)    USES CPAP   . Pancreatitis ~ 1980  . Peripheral vascular disease ([place])   . Pre-diabetes   . Prostate cancer ([place]) 04/2010    Past Surgical History:  Procedure Laterality Date  . ANTERIOR CERVICAL DECOMP/DISCECTOMY FUSION  05/26/2006   PLATE PLACED  . BACK SURGERY    . CARDIAC CATHETERIZATION  11/20/1999  . CORONARY ARTERY BYPASS GRAFT  11/20/1999   SVG-RI1-RI2, SVG-OM, SVG-dRCA  . CORONARY STENT INTERVENTION N/A 04/13/2019   Procedure: CORONARY STENT INTERVENTION;  Surgeon: Burnell Blanks, MD;  Location: [facility];  Service: Cardiovascular;  Laterality: N/A;  . HERNIA REPAIR    . INGUINAL HERNIA REPAIR Left 01/02/2019   Procedure: OPEN LEFT INGUINAL HERNIA REPAIR WITH MESH;  Surgeon: Johnathan Hausen, MD;  Location: [facility];  Service: General;  Laterality: Left;  . KNEE ARTHROSCOPY Right 1992; 04/2008  . LAPAROSCOPIC CHOLECYSTECTOMY  2007  . LEFT HEART CATH AND CORS/GRAFTS ANGIOGRAPHY N/A 04/13/2019   Procedure: LEFT HEART CATH AND CORS/GRAFTS ANGIOGRAPHY;  Surgeon: Burnell Blanks, MD;  Location: [facility];  Service: Cardiovascular;  Laterality: N/A;  . NASAL SEPTUM SURGERY Right 2007  . ROBOT ASSISTED LAPAROSCOPIC RADICAL PROSTATECTOMY  04/2010  . THYROIDECTOMY,  PARTIAL  1980    MEDICATIONS: . acetaminophen (TYLENOL) 500 MG tablet  . albuterol (PROAIR HFA) 108 (90 BASE) MCG/ACT inhaler  . amLODipine (NORVASC) 5 MG tablet  . aspirin EC 81 MG tablet  . cholecalciferol (VITAMIN D3) 25 MCG (1000 UNIT) tablet  . clotrimazole-betamethasone (LOTRISONE) cream  . Cyanocobalamin (B-12) 2500 MCG TABS  . diazepam (VALIUM) 5 MG tablet  . EPINEPHrine 0.3 mg/0.3 mL IJ SOAJ injection  . ezetimibe (ZETIA) 10 MG tablet  . FLOVENT HFA 110 MCG/ACT inhaler  . fluticasone (FLONASE) 50 MCG/ACT nasal spray  . folic acid (FOLVITE) 233 MCG tablet  . gabapentin (NEURONTIN) 300 MG capsule  . Glucos-Chond-Hyal Ac-Ca Fructo (MOVE FREE JOINT HEALTH ADVANCE PO)  . hydrochlorothiazide (HYDRODIURIL) 25 MG tablet  . ketoconazole (NIZORAL) 2 % cream  . loratadine (CLARITIN) 10 MG tablet  . losartan (COZAAR) 100 MG tablet  . Miconazole Nitrate (LOTRIMIN AF DEODORANT POWDER) 2 % AERP  . nitroGLYCERIN (NITROSTAT) 0.4 MG SL tablet  . nystatin-triamcinolone ointment (MYCOLOG)  . pantoprazole (PROTONIX) 40 MG tablet  .  polyethylene glycol powder (GLYCOLAX/MIRALAX) powder  . simvastatin (ZOCOR) 20 MG tablet  . traMADol (ULTRAM) 50 MG tablet  . Wheat Dextrin (BENEFIBER) POWD   No current facility-administered medications for this encounter.    Konrad Felix, PA-C [facility] [phone]

## 2020-06-14 ENCOUNTER — Other Ambulatory Visit (HOSPITAL_COMMUNITY)
Admission: RE | Admit: 2020-06-14 | Discharge: 2020-06-14 | Disposition: A | Discharge status: Home / Self Care | Payer: PPO | Source: Ambulatory Visit | Attending: Orthopaedic Surgery | Admitting: Orthopaedic Surgery

## 2020-06-14 DIAGNOSIS — Z20822 Contact with and (suspected) exposure to covid-19: Secondary | ICD-10-CM | POA: Diagnosis not present

## 2020-06-14 DIAGNOSIS — Z01812 Encounter for preprocedural laboratory examination: Secondary | ICD-10-CM | POA: Diagnosis not present

## 2020-06-14 LAB — SARS CORONAVIRUS 2 (TAT 6-24 HRS): SARS Coronavirus 2: NEGATIVE

## 2020-06-14 NOTE — H&P (Signed)
TOTAL KNEE ADMISSION H&P  Patient is being admitted for left total knee arthroplasty.  Subjective:  Chief Complaint:left knee pain.  HPI: Joshua [name], 80 y.o. male, has a history of pain and functional disability in the left knee due to arthritis and has failed non-surgical conservative treatments for greater than 12 weeks to includeNSAID's and/or analgesics, corticosteriod injections, viscosupplementation injections, flexibility and strengthening excercises, use of assistive devices, weight reduction as appropriate and activity modification.  Onset of symptoms was gradual, starting 5 years ago with gradually worsening course since that time. The patient noted no past surgery on the left knee(s).  Patient currently rates pain in the left knee(s) at 10 out of 10 with activity. Patient has night pain, worsening of pain with activity and weight bearing, pain that interferes with activities of daily living, crepitus and joint swelling.  Patient has evidence of subchondral cysts, subchondral sclerosis, periarticular osteophytes and joint space narrowing by imaging studies. There is no active infection.  Patient Active Problem List   Diagnosis Date Noted  . Pre-operative respiratory examination 06/05/2020  . Coronary artery disease involving coronary bypass graft of native heart without angina pectoris 04/07/2020  . Trigger thumb, left thumb 01/24/2020  . Overactive bladder 11/24/2019  . Left lateral epicondylitis 09/05/2019  . Degenerative arthritis of left knee 08/08/2019  . Educated about COVID-19 virus infection 04/20/2019  . Unstable angina ([place])   . Greater trochanteric bursitis of left hip 10/27/2018  . Degenerative disc disease, lumbar 10/27/2018  . Abdominal muscle strain, initial encounter 06/16/2018  . Aortic atherosclerosis ([place]) 05/05/2018  . Hyperglycemia 10/07/2017  . Weakness of left leg 06/21/2017  . DJD (degenerative joint disease)   . Arthritis   . Neck pain 02/08/2017   . Venous insufficiency 12/29/2016  . Ganglion cyst of tendon sheath of right hand 12/29/2016  . Avulsion fracture of metatarsal bone of right foot 11/25/2016  . Gout 11/20/2016  . Obesity, Class I, BMI 30-34.9 10/03/2015  . Hereditary and idiopathic peripheral neuropathy 09/25/2014  . Allergic rhinitis 05/31/2014  . Fatigue 11/09/2013  . CKD (chronic kidney disease), stage III ([place]) 10/23/2013  . Dizziness 08/23/2013  . Neuropathy ([place]) 08/21/2013  . RLS (restless legs syndrome) 01/03/2013  . Chronic bilateral low back pain with left-sided sciatica 10/09/2011  . History of bladder cancer 04/01/2010  . OSTEOARTHRITIS, LOWER LEG, LEFT 10/04/2009  . ANEMIA, IRON DEFICIENCY 11/06/2008  . 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 Essential hypertension 02/21/2007  . OSA (obstructive sleep apnea) 11/02/2006  . Hyperlipidemia 10/12/2006  . Depression, major, single episode, in partial remission ([place]) 10/12/2006  . Coronary atherosclerosis 10/12/2006  . Asthma 10/12/2006  . GERD 10/12/2006  . Irritable bowel syndrome 10/12/2006   Past Medical History:  Diagnosis Date  . Acute medial meniscal tear   . Anemia 2015  . Arthritis    "middle finger right hand; right knee; neck" (02/06/2014)  . Asthma    seasonal  . Bladder cancer ([place]) 04/2010   "cauterized during prostate OR"  . CAD (coronary artery disease)    CABG 2001  . Diverticulosis   . DJD (degenerative joint disease)    BACK  . GERD (gastroesophageal reflux disease)   . H/O inguinal hernia repair 12/2018  . History of gout   . Hyperlipidemia   . Hypertension   . IBS (irritable bowel syndrome)   . Obesity   . OSA (obstructive sleep apnea)    USES CPAP   . Pancreatitis ~ 1980  . Peripheral vascular  disease ([place])   . Pre-diabetes   . Prostate cancer ([place]) 04/2010    Past Surgical History:  Procedure Laterality Date  . ANTERIOR CERVICAL DECOMP/DISCECTOMY FUSION  05/26/2006   PLATE PLACED  . BACK SURGERY    .  CARDIAC CATHETERIZATION  11/20/1999  . CORONARY ARTERY BYPASS GRAFT  11/20/1999   SVG-RI1-RI2, SVG-OM, SVG-dRCA  . CORONARY STENT INTERVENTION N/A 04/13/2019   Procedure: CORONARY STENT INTERVENTION;  Surgeon: Burnell Blanks, MD;  Location: [facility];  Service: Cardiovascular;  Laterality: N/A;  . HERNIA REPAIR    . INGUINAL HERNIA REPAIR Left 01/02/2019   Procedure: OPEN LEFT INGUINAL HERNIA REPAIR WITH MESH;  Surgeon: Johnathan Hausen, MD;  Location: [facility];  Service: General;  Laterality: Left;  . KNEE ARTHROSCOPY Right 1992; 04/2008  . LAPAROSCOPIC CHOLECYSTECTOMY  2007  . LEFT HEART CATH AND CORS/GRAFTS ANGIOGRAPHY N/A 04/13/2019   Procedure: LEFT HEART CATH AND CORS/GRAFTS ANGIOGRAPHY;  Surgeon: Burnell Blanks, MD;  Location: [facility];  Service: Cardiovascular;  Laterality: N/A;  . NASAL SEPTUM SURGERY Right 2007  . ROBOT ASSISTED LAPAROSCOPIC RADICAL PROSTATECTOMY  04/2010  . THYROIDECTOMY, PARTIAL  1980    No current facility-administered medications for this encounter.   Current Outpatient Medications  Medication Sig Dispense Refill Last Dose  . acetaminophen (TYLENOL) 500 MG tablet Take 1,000 mg by mouth every 6 (six) hours as needed for moderate pain or headache.     . albuterol (PROAIR HFA) 108 (90 BASE) MCG/ACT inhaler Inhale 1-2 puffs into the lungs every 6 (six) hours as needed for wheezing or shortness of breath. 18 g 3   . amLODipine (NORVASC) 5 MG tablet TAKE 1 TABLET BY MOUTH EVERY DAY (Patient taking differently: Take 5 mg by mouth daily.) 90 tablet 3   . aspirin EC 81 MG tablet Take 81 mg by mouth at bedtime.      . cholecalciferol (VITAMIN D3) 25 MCG (1000 UNIT) tablet Take 1,000 Units by mouth daily.     . clotrimazole-betamethasone (LOTRISONE) cream Apply 1 application topically See admin instructions. Twice daily every 4 days     . Cyanocobalamin (B-12) 2500 MCG TABS Take 1,250 mcg by mouth daily.     . diazepam (VALIUM) 5 MG tablet  TAKE 1 TABLET (5 MG TOTAL) BY MOUTH AT BEDTIME AS NEEDED. (Patient taking differently: Take 5 mg by mouth at bedtime as needed (sleep).) 30 tablet 1   . EPINEPHrine 0.3 mg/0.3 mL IJ SOAJ injection Inject 0.3 mg into the muscle as needed for anaphylaxis. for allergic reaction     . ezetimibe (ZETIA) 10 MG tablet Take 1 tablet (10 mg total) by mouth daily. 90 tablet 3   . FLOVENT HFA 110 MCG/ACT inhaler Inhale 1 puff into the lungs 2 (two) times daily.   5   . fluticasone (FLONASE) 50 MCG/ACT nasal spray Place 1 spray into both nostrils 2 (two) times daily.      . folic acid (FOLVITE) 315 MCG tablet Take 800 mcg by mouth daily.      [name] gabapentin (NEURONTIN) 300 MG capsule Take 1 cap in AM, 1 cap at noon, 2 caps at bedtime (Patient taking differently: Take 300-600 mg by mouth See admin instructions. Take 300 mg in the morning, 300 mg at noon, and 600 mg at bedtime) 360 capsule 3   . Glucos-Chond-Hyal Ac-Ca Fructo (MOVE FREE JOINT HEALTH ADVANCE PO) Take 1 tablet by mouth daily.     . hydrochlorothiazide (HYDRODIURIL) 25  MG tablet TAKE 1 TABLET BY MOUTH EVERY DAY (Patient taking differently: Take 25 mg by mouth daily.) 90 tablet 2   . ketoconazole (NIZORAL) 2 % cream Apply 1 application topically See admin instructions. Twice a day every other day     . loratadine (CLARITIN) 10 MG tablet Take 10 mg by mouth at bedtime.      [name] losartan (COZAAR) 100 MG tablet TAKE 1 TABLET BY MOUTH EVERY DAY (Patient taking differently: Take 100 mg by mouth daily.) 90 tablet 3   . Miconazole Nitrate (LOTRIMIN AF DEODORANT POWDER) 2 % AERP Apply 1 spray topically daily as needed (jock itch).     . nitroGLYCERIN (NITROSTAT) 0.4 MG SL tablet PLACE 1 TABLET UNDER THE TONGUE EVERY 5 (FIVE) MINUTES AS NEEDED. (Patient taking differently: Place 0.4 mg under the tongue every 5 (five) minutes as needed for chest pain.) 75 tablet 1   . nystatin-triamcinolone ointment (MYCOLOG) Apply 1 application topically See admin instructions.  Twice daily every 4 days     . pantoprazole (PROTONIX) 40 MG tablet TAKE 1 TABLET BY MOUTH EVERY DAY (Patient taking differently: Take 40 mg by mouth daily.) 90 tablet 1   . polyethylene glycol powder (GLYCOLAX/MIRALAX) powder Take 17 g by mouth daily. (Patient taking differently: Take 17 g by mouth in the morning and at bedtime.) 255 g 0   . simvastatin (ZOCOR) 20 MG tablet TAKE 1 TABLET BY MOUTH EVERYDAY AT BEDTIME (Patient taking differently: Take 20 mg by mouth at bedtime.) 90 tablet 3   . traMADol (ULTRAM) 50 MG tablet Take 1 tablet (50 mg total) by mouth every 6 (six) hours as needed. (Patient taking differently: Take 50 mg by mouth every 6 (six) hours as needed for moderate pain.) 20 tablet 1   . Wheat Dextrin (BENEFIBER) POWD Take 1 Dose by mouth daily. 1 dose = 2 teaspoons      Allergies  Allergen Reactions  . 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 Bromfed Nausea And Vomiting  . Clarithromycin Other (See Comments)     gastritis  . Codeine Other (See Comments)    Trouble breathing  . Doxycycline Hyclate Nausea And Vomiting  . Modafinil Other (See Comments)     anxiety-nervousness  . Oxycodone-Acetaminophen Itching  . Promethazine Hcl Other (See Comments)     fainting  . Quinolones Nausea Only    Cipro "felt real bad"  . Telithromycin Nausea And Vomiting  . Hydrocodone-Acetaminophen Rash  . Statins Other (See Comments)    Leg cramps and makes patient feel bad. Per pt tried multiple in years past  . Sulfonamide Derivatives Rash    Social History   Tobacco Use  . Smoking status: Former Smoker    Packs/day: 1.00    Years: 35.00    Pack years: 35.00    Types: Cigarettes    Quit date: 06/16/1992    Years since quitting: 28.0  . Smokeless tobacco: Never Used  Substance Use Topics  . Alcohol use: Yes    Alcohol/week: 2.0 standard drinks    Types: 2 Shots of liquor per week    Family History  Problem Relation  Age of Onset  . Heart disease Mother   . Hyperlipidemia Mother   . Heart disease Father   . Hyperlipidemia Father   . Diabetes Brother      Review of Systems  Musculoskeletal: Positive for arthralgias.  Left knee  All other systems reviewed and are negative.   Objective:  Physical Exam Constitutional:      Appearance: Normal appearance.  HENT:     Head: Normocephalic and atraumatic.     Nose: Nose normal.     Mouth/Throat:     Pharynx: Oropharynx is clear.  Eyes:     Extraocular Movements: Extraocular movements intact.  Cardiovascular:     Rate and Rhythm: Normal rate and regular rhythm.  Pulmonary:     Effort: Pulmonary effort is normal.  Abdominal:     Palpations: Abdomen is soft.  Musculoskeletal:     Cervical back: Normal range of motion.     Comments: Left knee has no scars and no effusion.  His motion is from 0-110.  He has medial joint line pain and some crepitation.  Hip motion is a little bit limited but not very painful.  He walks with a mildly altered gait.  Sensation and motor function are intact distally with palpable pulses in his feet.    Skin:    General: Skin is warm and dry.  Neurological:     General: No focal deficit present.     Mental Status: He is alert and oriented to person, place, and time.  Psychiatric:        Mood and Affect: Mood normal.        Behavior: Behavior normal.        Thought Content: Thought content normal.        Judgment: Judgment normal.     Vital signs in last 24 hours:    Labs:   Estimated body mass index is 34.54 kg/m as calculated from the following:   Height as of 06/11/20: 5\' 6"  (1.676 m).   Weight as of 06/11/20: 97.1 kg.   Imaging Review Plain radiographs demonstrate severe degenerative joint disease of the left knee(s). The overall alignment isneutral. The bone quality appears to be good for age and reported activity level.      Assessment/Plan:  End stage primary arthritis, left knee   The  patient history, physical examination, clinical judgment of the provider and imaging studies are consistent with end stage degenerative joint disease of the left knee(s) and total knee arthroplasty is deemed medically necessary. The treatment options including medical management, injection therapy arthroscopy and arthroplasty were discussed at length. The risks and benefits of total knee arthroplasty were presented and reviewed. The risks due to aseptic loosening, infection, stiffness, patella tracking problems, thromboembolic complications and other imponderables were discussed. The patient acknowledged the explanation, agreed to proceed with the plan and consent was signed. Patient is being admitted for inpatient treatment for surgery, pain control, PT, OT, prophylactic antibiotics, VTE prophylaxis, progressive ambulation and ADL's and discharge planning. The patient is planning to be discharged home with home health services  Patient's anticipated LOS is less than 2 midnights, meeting these requirements: - Younger than 81 - Lives within 1 hour of care - Has a competent adult at home to recover with post-op recover - NO history of  - Chronic pain requiring opiods  - Diabetes  - Coronary Artery Disease  - Heart failure  - Heart attack  - Stroke  - DVT/VTE  - Cardiac arrhythmia  - Respiratory Failure/COPD  - Renal failure  - Anemia  - Advanced Liver disease

## 2020-06-17 ENCOUNTER — Telehealth: Payer: Self-pay | Admitting: Family Medicine

## 2020-06-17 MED ORDER — BUPIVACAINE LIPOSOME 1.3 % IJ SUSP
20.0000 mL | Freq: Once | INTRAMUSCULAR | Status: DC
  Filled 2020-06-17: qty 20

## 2020-06-17 MED ORDER — TRANEXAMIC ACID 1000 MG/10ML IV SOLN
2000.0000 mg | INTRAVENOUS | Status: DC
  Filled 2020-06-17: qty 20

## 2020-06-17 NOTE — Telephone Encounter (Signed)
Pt has been playing phone tag with our office- states [facility] said they can't get pt nasal mask until 09/11/2020. Pt states he was told they ordered the wrong mask. BW said she'd try get him a sample. Pt having surgery 3/29 and won't be able to leave house for 6 weeks. Please advise [phone]

## 2020-06-17 NOTE — Telephone Encounter (Signed)
Patient called asking if Dr Tamala Julian was able to speak to Dr Rhona Raider regarding his concerns with neuropathy and wearing compression socks?  He is scheduled for surgery tomorrow (06/18/2020).

## 2020-06-17 NOTE — Telephone Encounter (Signed)
Spoke with patient. I provided him with the name of the mask. He verbalized understanding. Also explained to him that [facility] has the mask and headgear available.   Nothing further needed at time of call.

## 2020-06-17 NOTE — Anesthesia Preprocedure Evaluation (Addendum)
Anesthesia Evaluation  Patient identified by MRN, date of birth, ID band Patient awake    Reviewed: Allergy & Precautions, H&P , NPO status , Patient's Chart, lab work & pertinent test results  Airway Mallampati: III  TM Distance: >3 FB Neck ROM: Full    Dental no notable dental hx. (+) Teeth Intact, Dental Advisory Given   Pulmonary asthma , sleep apnea and Continuous Positive Airway Pressure Ventilation , former smoker,    Pulmonary exam normal breath sounds clear to auscultation       Cardiovascular Exercise Tolerance: Good hypertension, + CAD, + CABG and + Peripheral Vascular Disease   Rhythm:Regular Rate:Normal     Neuro/Psych Depression negative neurological ROS     GI/Hepatic Neg liver ROS, GERD  ,  Endo/Other  negative endocrine ROS  Renal/GU negative Renal ROS  negative genitourinary   Musculoskeletal  (+) Arthritis ,   Abdominal   Peds  Hematology  (+) Blood dyscrasia, anemia ,   Anesthesia Other Findings   Reproductive/Obstetrics negative OB ROS                            Anesthesia Physical Anesthesia Plan  ASA: III  Anesthesia Plan: Spinal   Post-op Pain Management:  Regional for Post-op pain   Induction: Intravenous  PONV Risk Score and Plan: 2 and Ondansetron, Dexamethasone and Midazolam  Airway Management Planned: Simple Face Mask  Additional Equipment:   Intra-op Plan:   Post-operative Plan:   Informed Consent: I have reviewed the patients History and Physical, chart, labs and discussed the procedure including the risks, benefits and alternatives for the proposed anesthesia with the patient or authorized representative who has indicated his/her understanding and acceptance.     Dental advisory given  Plan Discussed with: CRNA  Anesthesia Plan Comments:         Anesthesia Quick Evaluation

## 2020-06-18 ENCOUNTER — Encounter (HOSPITAL_COMMUNITY)
Admission: RE | Disposition: A | Discharge status: Home / Self Care | Payer: Self-pay | Source: Home / Self Care | Attending: Orthopaedic Surgery

## 2020-06-18 ENCOUNTER — Ambulatory Visit (HOSPITAL_COMMUNITY)
Admission: RE | Admit: 2020-06-18 | Discharge: 2020-06-18 | Disposition: A | Discharge status: Home / Self Care | Payer: PPO | Attending: Orthopaedic Surgery | Admitting: Orthopaedic Surgery

## 2020-06-18 ENCOUNTER — Ambulatory Visit (HOSPITAL_COMMUNITY): Payer: PPO | Admitting: Physician Assistant

## 2020-06-18 ENCOUNTER — Ambulatory Visit (HOSPITAL_COMMUNITY): Payer: PPO | Admitting: Anesthesiology

## 2020-06-18 ENCOUNTER — Encounter (HOSPITAL_COMMUNITY): Payer: Self-pay | Admitting: Orthopaedic Surgery

## 2020-06-18 DIAGNOSIS — Z79899 Other long term (current) drug therapy: Secondary | ICD-10-CM | POA: Insufficient documentation

## 2020-06-18 DIAGNOSIS — Z882 Allergy status to sulfonamides status: Secondary | ICD-10-CM | POA: Insufficient documentation

## 2020-06-18 DIAGNOSIS — M1712 Unilateral primary osteoarthritis, left knee: Secondary | ICD-10-CM | POA: Diagnosis present

## 2020-06-18 DIAGNOSIS — Z881 Allergy status to other antibiotic agents status: Secondary | ICD-10-CM | POA: Insufficient documentation

## 2020-06-18 DIAGNOSIS — Z7951 Long term (current) use of inhaled steroids: Secondary | ICD-10-CM | POA: Diagnosis not present

## 2020-06-18 DIAGNOSIS — M6281 Muscle weakness (generalized): Secondary | ICD-10-CM | POA: Insufficient documentation

## 2020-06-18 DIAGNOSIS — I129 Hypertensive chronic kidney disease with stage 1 through stage 4 chronic kidney disease, or unspecified chronic kidney disease: Secondary | ICD-10-CM | POA: Diagnosis not present

## 2020-06-18 DIAGNOSIS — Z885 Allergy status to narcotic agent status: Secondary | ICD-10-CM | POA: Diagnosis not present

## 2020-06-18 DIAGNOSIS — Z87891 Personal history of nicotine dependence: Secondary | ICD-10-CM | POA: Diagnosis not present

## 2020-06-18 DIAGNOSIS — Z888 Allergy status to other drugs, medicaments and biological substances status: Secondary | ICD-10-CM | POA: Diagnosis not present

## 2020-06-18 DIAGNOSIS — N183 Chronic kidney disease, stage 3 unspecified: Secondary | ICD-10-CM | POA: Diagnosis not present

## 2020-06-18 DIAGNOSIS — Z7982 Long term (current) use of aspirin: Secondary | ICD-10-CM | POA: Diagnosis not present

## 2020-06-18 DIAGNOSIS — G8918 Other acute postprocedural pain: Secondary | ICD-10-CM | POA: Diagnosis not present

## 2020-06-18 DIAGNOSIS — Z96652 Presence of left artificial knee joint: Secondary | ICD-10-CM | POA: Diagnosis not present

## 2020-06-18 HISTORY — PX: TOTAL KNEE ARTHROPLASTY: SHX125

## 2020-06-18 LAB — TYPE AND SCREEN
ABO/RH(D): O NEG
Antibody Screen: NEGATIVE

## 2020-06-18 SURGERY — ARTHROPLASTY, KNEE, TOTAL
Anesthesia: Spinal | Site: Knee | Laterality: Left

## 2020-06-18 MED ORDER — LACTATED RINGERS IV SOLN: INTRAVENOUS | Status: DC

## 2020-06-18 MED ORDER — ONDANSETRON HCL 4 MG/2ML IJ SOLN
INTRAMUSCULAR | Status: DC | PRN
  Administered 2020-06-18: 4 mg via INTRAVENOUS

## 2020-06-18 MED ORDER — SODIUM CHLORIDE 0.9 % IR SOLN
Status: DC | PRN
  Administered 2020-06-18 (×2): 1000 mL

## 2020-06-18 MED ORDER — BUPIVACAINE-EPINEPHRINE (PF) 0.25% -1:200000 IJ SOLN
INTRAMUSCULAR | Status: AC
  Filled 2020-06-18: qty 30

## 2020-06-18 MED ORDER — TRANEXAMIC ACID 1000 MG/10ML IV SOLN
INTRAVENOUS | Status: DC | PRN
  Administered 2020-06-18: 2000 mg via TOPICAL

## 2020-06-18 MED ORDER — ORAL CARE MOUTH RINSE
15.0000 mL | Freq: Once | OROMUCOSAL | Status: AC
  Administered 2020-06-18: 15 mL via OROMUCOSAL

## 2020-06-18 MED ORDER — METHOCARBAMOL 500 MG IVPB - SIMPLE MED: 500.0000 mg | Freq: Four times a day (QID) | INTRAVENOUS | Status: DC | PRN

## 2020-06-18 MED ORDER — CHLORHEXIDINE GLUCONATE 0.12 % MT SOLN: 15.0000 mL | Freq: Once | OROMUCOSAL | Status: AC

## 2020-06-18 MED ORDER — METHOCARBAMOL 500 MG PO TABS: 500.0000 mg | ORAL_TABLET | Freq: Four times a day (QID) | ORAL | Status: DC | PRN

## 2020-06-18 MED ORDER — LACTATED RINGERS IV BOLUS
250.0000 mL | Freq: Once | INTRAVENOUS | Status: AC
  Administered 2020-06-18: 250 mL via INTRAVENOUS

## 2020-06-18 MED ORDER — HYDROMORPHONE HCL 1 MG/ML IJ SOLN: 0.2500 mg | INTRAMUSCULAR | Status: DC | PRN

## 2020-06-18 MED ORDER — HYDROMORPHONE HCL 2 MG PO TABS: 2.0000 mg | ORAL_TABLET | Freq: Four times a day (QID) | ORAL | 0 refills | Status: AC | PRN

## 2020-06-18 MED ORDER — CEFAZOLIN SODIUM-DEXTROSE 2-4 GM/100ML-% IV SOLN: 2.0000 g | Freq: Four times a day (QID) | INTRAVENOUS | Status: DC

## 2020-06-18 MED ORDER — SODIUM CHLORIDE (PF) 0.9 % IJ SOLN
INTRAMUSCULAR | Status: AC
  Filled 2020-06-18: qty 30

## 2020-06-18 MED ORDER — EPHEDRINE 5 MG/ML INJ
INTRAVENOUS | Status: AC
  Filled 2020-06-18: qty 10

## 2020-06-18 MED ORDER — TRANEXAMIC ACID-NACL 1000-0.7 MG/100ML-% IV SOLN: 1000.0000 mg | Freq: Once | INTRAVENOUS | Status: DC

## 2020-06-18 MED ORDER — BUPIVACAINE LIPOSOME 1.3 % IJ SUSP
INTRAMUSCULAR | Status: DC | PRN
  Administered 2020-06-18: 30 mL

## 2020-06-18 MED ORDER — ONDANSETRON HCL 4 MG/2ML IJ SOLN
INTRAMUSCULAR | Status: AC
  Filled 2020-06-18: qty 2

## 2020-06-18 MED ORDER — DEXAMETHASONE SODIUM PHOSPHATE 10 MG/ML IJ SOLN
INTRAMUSCULAR | Status: DC | PRN
  Administered 2020-06-18: 10 mg via INTRAVENOUS

## 2020-06-18 MED ORDER — MIDAZOLAM HCL 5 MG/5ML IJ SOLN
INTRAMUSCULAR | Status: DC | PRN
  Administered 2020-06-18 (×2): 1 mg via INTRAVENOUS

## 2020-06-18 MED ORDER — MIDAZOLAM HCL 2 MG/2ML IJ SOLN
INTRAMUSCULAR | Status: AC
  Filled 2020-06-18: qty 2

## 2020-06-18 MED ORDER — BUPIVACAINE-EPINEPHRINE (PF) 0.5% -1:200000 IJ SOLN
INTRAMUSCULAR | Status: DC | PRN
  Administered 2020-06-18: 20 mL via PERINEURAL

## 2020-06-18 MED ORDER — TIZANIDINE HCL 4 MG PO TABS: 4.0000 mg | ORAL_TABLET | Freq: Four times a day (QID) | ORAL | 1 refills | Status: AC | PRN

## 2020-06-18 MED ORDER — 0.9 % SODIUM CHLORIDE (POUR BTL) OPTIME
TOPICAL | Status: DC | PRN
  Administered 2020-06-18: 1000 mL

## 2020-06-18 MED ORDER — PROPOFOL 500 MG/50ML IV EMUL
INTRAVENOUS | Status: DC | PRN
  Administered 2020-06-18: 75 ug/kg/min via INTRAVENOUS

## 2020-06-18 MED ORDER — POVIDONE-IODINE 10 % EX SWAB
2.0000 "application " | Freq: Once | CUTANEOUS | Status: AC
  Administered 2020-06-18: 2 via TOPICAL

## 2020-06-18 MED ORDER — EPHEDRINE SULFATE-NACL 50-0.9 MG/10ML-% IV SOSY
PREFILLED_SYRINGE | INTRAVENOUS | Status: DC | PRN
  Administered 2020-06-18: 5 mg via INTRAVENOUS
  Administered 2020-06-18 (×4): 10 mg via INTRAVENOUS

## 2020-06-18 MED ORDER — TRANEXAMIC ACID-NACL 1000-0.7 MG/100ML-% IV SOLN
1000.0000 mg | INTRAVENOUS | Status: AC
  Administered 2020-06-18: 1000 mg via INTRAVENOUS
  Filled 2020-06-18: qty 100

## 2020-06-18 MED ORDER — FENTANYL CITRATE (PF) 100 MCG/2ML IJ SOLN
INTRAMUSCULAR | Status: DC | PRN
  Administered 2020-06-18 (×2): 50 ug via INTRAVENOUS

## 2020-06-18 MED ORDER — SODIUM CHLORIDE (PF) 0.9 % IJ SOLN
INTRAMUSCULAR | Status: DC | PRN
  Administered 2020-06-18: 30 mL

## 2020-06-18 MED ORDER — CEFAZOLIN SODIUM-DEXTROSE 2-4 GM/100ML-% IV SOLN
2.0000 g | INTRAVENOUS | Status: AC
  Administered 2020-06-18: 2 g via INTRAVENOUS
  Filled 2020-06-18: qty 100

## 2020-06-18 MED ORDER — PROPOFOL 10 MG/ML IV BOLUS
INTRAVENOUS | Status: DC | PRN
  Administered 2020-06-18 (×2): 20 mg via INTRAVENOUS

## 2020-06-18 MED ORDER — LACTATED RINGERS IV BOLUS
500.0000 mL | Freq: Once | INTRAVENOUS | Status: AC
Start: 2020-06-18 — End: 2020-06-18
  Administered 2020-06-18: 500 mL via INTRAVENOUS

## 2020-06-18 MED ORDER — KETOROLAC TROMETHAMINE 15 MG/ML IJ SOLN: 7.5000 mg | Freq: Four times a day (QID) | INTRAMUSCULAR | Status: DC

## 2020-06-18 MED ORDER — FENTANYL CITRATE (PF) 100 MCG/2ML IJ SOLN
INTRAMUSCULAR | Status: AC
  Filled 2020-06-18: qty 2

## 2020-06-18 MED ORDER — DEXAMETHASONE SODIUM PHOSPHATE 10 MG/ML IJ SOLN
INTRAMUSCULAR | Status: AC
  Filled 2020-06-18: qty 1

## 2020-06-18 MED ORDER — BUPIVACAINE IN DEXTROSE 0.75-8.25 % IT SOLN
INTRATHECAL | Status: DC | PRN
  Administered 2020-06-18: 1.6 mL via INTRATHECAL

## 2020-06-18 MED ORDER — BUPIVACAINE-EPINEPHRINE 0.5% -1:200000 IJ SOLN
INTRAMUSCULAR | Status: DC | PRN
  Administered 2020-06-18: 30 mL

## 2020-06-18 MED ORDER — ASPIRIN EC 81 MG PO TBEC: 81.0000 mg | DELAYED_RELEASE_TABLET | Freq: Two times a day (BID) | ORAL | 11 refills | Status: AC

## 2020-06-18 SURGICAL SUPPLY — 58 items
ATTUNE MED DOME PAT 38 KNEE (Knees) ×1 IMPLANT
ATTUNE PS FEM LT SZ 7 CEM KNEE (Femur) ×1 IMPLANT
ATTUNE PSRP INSR SZ7 5 KNEE (Insert) ×1 IMPLANT
BAG DECANTER FOR FLEXI CONT (MISCELLANEOUS) ×2 IMPLANT
BAG SPEC THK2 15X12 ZIP CLS (MISCELLANEOUS) ×1
BAG ZIPLOCK 12X15 (MISCELLANEOUS) ×2 IMPLANT
BASE TIBIA ATTUNE KNEE SYS SZ6 (Knees) IMPLANT
BLADE SAGITTAL 25.0X1.19X90 (BLADE) ×2 IMPLANT
BLADE SAW SGTL 11.0X1.19X90.0M (BLADE) ×2 IMPLANT
BNDG ELASTIC 6X5.8 VLCR STR LF (GAUZE/BANDAGES/DRESSINGS) ×2 IMPLANT
BOOTIES KNEE HIGH SLOAN (MISCELLANEOUS) ×2 IMPLANT
BOWL SMART MIX CTS (DISPOSABLE) ×2 IMPLANT
BSPLAT TIB 6 CMNT ROT PLAT STR (Knees) ×1 IMPLANT
CATH FOLEY 2WAY SLVR  5CC 14FR (CATHETERS) ×2
CATH FOLEY 2WAY SLVR 5CC 14FR (CATHETERS) IMPLANT
CEMENT HV SMART SET (Cement) ×4 IMPLANT
COVER SURGICAL LIGHT HANDLE (MISCELLANEOUS) ×2 IMPLANT
COVER WAND RF STERILE (DRAPES) ×2 IMPLANT
CUFF TOURN SGL QUICK 34 (TOURNIQUET CUFF) ×2
CUFF TRNQT CYL 34X4.125X (TOURNIQUET CUFF) ×1 IMPLANT
DECANTER SPIKE VIAL GLASS SM (MISCELLANEOUS) ×2 IMPLANT
DRAPE ORTHO SPLIT 77X108 STRL (DRAPES)
DRAPE SHEET LG 3/4 BI-LAMINATE (DRAPES) ×2 IMPLANT
DRAPE SURG ORHT 6 SPLT 77X108 (DRAPES) IMPLANT
DRAPE TOP 10253 STERILE (DRAPES) ×2 IMPLANT
DRAPE U-SHAPE 47X51 STRL (DRAPES) ×2 IMPLANT
DRSG AQUACEL AG ADV 3.5X10 (GAUZE/BANDAGES/DRESSINGS) ×2 IMPLANT
DURAPREP 26ML APPLICATOR (WOUND CARE) ×4 IMPLANT
ELECT REM PT RETURN 15FT ADLT (MISCELLANEOUS) ×2 IMPLANT
GLOVE SRG 8 PF TXTR STRL LF DI (GLOVE) ×2 IMPLANT
GLOVE SURG ENC MOIS LTX SZ8 (GLOVE) ×4 IMPLANT
GLOVE SURG UNDER POLY LF SZ8 (GLOVE) ×4
GOWN STRL REUS W/TWL XL LVL3 (GOWN DISPOSABLE) ×4 IMPLANT
HANDPIECE INTERPULSE COAX TIP (DISPOSABLE) ×2
HOLDER FOLEY CATH W/STRAP (MISCELLANEOUS) ×1 IMPLANT
HOOD PEEL AWAY FLYTE STAYCOOL (MISCELLANEOUS) ×6 IMPLANT
KIT TURNOVER KIT A (KITS) ×2 IMPLANT
MANIFOLD NEPTUNE II (INSTRUMENTS) ×2 IMPLANT
NEEDLE HYPO 22GX1.5 SAFETY (NEEDLE) ×1 IMPLANT
NS IRRIG 1000ML POUR BTL (IV SOLUTION) ×2 IMPLANT
PACK TOTAL KNEE CUSTOM (KITS) ×2 IMPLANT
PAD ARMBOARD 7.5X6 YLW CONV (MISCELLANEOUS) ×2 IMPLANT
PENCIL SMOKE EVACUATOR (MISCELLANEOUS) IMPLANT
PIN DRILL FIX HALF THREAD (BIT) ×1 IMPLANT
PIN FIX SIGMA LCS THRD HI (PIN) ×1 IMPLANT
PROTECTOR NERVE ULNAR (MISCELLANEOUS) ×2 IMPLANT
SET HNDPC FAN SPRY TIP SCT (DISPOSABLE) ×1 IMPLANT
SUT ETHIBOND NAB CT1 #1 30IN (SUTURE) ×4 IMPLANT
SUT VIC AB 0 CT1 36 (SUTURE) ×2 IMPLANT
SUT VIC AB 2-0 CT1 27 (SUTURE) ×2
SUT VIC AB 2-0 CT1 TAPERPNT 27 (SUTURE) ×1 IMPLANT
SUT VICRYL AB 3-0 FS1 BRD 27IN (SUTURE) ×2 IMPLANT
SUT VLOC 180 ABS0 18IN GS21 (SUTURE) ×1 IMPLANT
TIBIA ATTUNE KNEE SYS BASE SZ6 (Knees) ×2 IMPLANT
TRAY FOLEY MTR SLVR 16FR STAT (SET/KITS/TRAYS/PACK) ×1 IMPLANT
TUBE SUCTION HIGH CAP CLEAR NV (SUCTIONS) ×1 IMPLANT
WATER STERILE IRR 1000ML POUR (IV SOLUTION) ×2 IMPLANT
WRAP KNEE MAXI GEL POST OP (GAUZE/BANDAGES/DRESSINGS) ×2 IMPLANT

## 2020-06-18 NOTE — Telephone Encounter (Signed)
ent my note, but will send a text as well

## 2020-06-18 NOTE — Interval H&P Note (Signed)
History and Physical Interval Note:  06/18/2020 7:28 AM  Joshua [name]  has presented today for surgery, with the diagnosis of LEFT KNEE DEGENERATIVE JOINT DISEASE.  The various methods of treatment have been discussed with the patient and family. After consideration of risks, benefits and other options for treatment, the patient has consented to  Procedure(s): LEFT TOTAL KNEE ARTHROPLASTY (Left) as a surgical intervention.  The patient's history has been reviewed, patient examined, no change in status, stable for surgery.  I have reviewed the patient's chart and labs.  Questions were answered to the patient's satisfaction.     Hessie Dibble

## 2020-06-18 NOTE — Transfer of Care (Signed)
Immediate Anesthesia Transfer of Care Note  Patient: Joshua [name]  Procedure(s) Performed: LEFT TOTAL KNEE ARTHROPLASTY (Left Knee)  Patient Location: PACU  Anesthesia Type:Regional and Spinal  Level of Consciousness: awake, alert  and oriented  Airway & Oxygen Therapy: Patient Spontanous Breathing and Patient connected to face mask oxygen  Post-op Assessment: Report given to RN and Post -op Vital signs reviewed and stable  Post vital signs: Reviewed and stable  Last Vitals:  Vitals Value Taken Time  BP 123/61 06/18/20 0943  Temp    Pulse 90 06/18/20 0944  Resp 23 06/18/20 0944  SpO2 96 % 06/18/20 0944  Vitals shown include unvalidated device data.  Last Pain:  Vitals:   06/18/20 0600  TempSrc: Oral         Complications: No complications documented.

## 2020-06-18 NOTE — Anesthesia Postprocedure Evaluation (Signed)
Anesthesia Post Note  Patient: Joshua [name]  Procedure(s) Performed: LEFT TOTAL KNEE ARTHROPLASTY (Left Knee)     Patient location during evaluation: PACU Anesthesia Type: Spinal and Regional Level of consciousness: oriented and awake and alert Pain management: pain level controlled Vital Signs Assessment: post-procedure vital signs reviewed and stable Respiratory status: spontaneous breathing and respiratory function stable Cardiovascular status: blood pressure returned to baseline and stable Postop Assessment: no headache, no backache, no apparent nausea or vomiting, spinal receding and patient able to bend at knees Anesthetic complications: no   No complications documented.  Last Vitals:  Vitals:   06/18/20 1000 06/18/20 1115  BP: (!) 146/72 (!) 158/84  Pulse: 84 84  Resp: 13 14  Temp:    SpO2: 100% 97%    Last Pain:  Vitals:   06/18/20 1015  TempSrc:   PainSc: 0-No pain                 Leighton Luster,W. EDMOND

## 2020-06-18 NOTE — Anesthesia Procedure Notes (Signed)
Spinal  Patient location during procedure: OR Start time: 06/18/2020 7:44 AM End time: 06/18/2020 7:46 AM Reason for block: surgical anesthesia Staffing Performed: anesthesiologist  Anesthesiologist: Roderic Palau, MD Preanesthetic Checklist Completed: patient identified, IV checked, risks and benefits discussed, surgical consent, monitors and equipment checked, pre-op evaluation and timeout performed Spinal Block Patient position: sitting Prep: DuraPrep Patient monitoring: cardiac monitor, continuous pulse ox and blood pressure Approach: midline Location: L3-4 Injection technique: single-shot Needle Needle type: Quincke  Needle gauge: 22 G Needle length: 9 cm Assessment Sensory level: T8 Events: CSF return and second provider Additional Notes Functioning IV was confirmed and monitors were applied. Sterile prep and drape, including hand hygiene and sterile gloves were used. The patient was positioned and the spine was prepped. The skin was anesthetized with lidocaine.  Free flow of clear CSF was obtained prior to injecting local anesthetic into the CSF.  The spinal needle aspirated freely following injection.  The needle was carefully withdrawn.  The patient tolerated the procedure well. Spinal attempted by crna x 2( midline and paramedian) with Pencan and Quinke prior to my attempt.

## 2020-06-18 NOTE — Anesthesia Procedure Notes (Signed)
Anesthesia Regional Block: Adductor canal block   Pre-Anesthetic Checklist: ,, timeout performed, Correct Patient, Correct Site, Correct Laterality, Correct Procedure, Correct Position, site marked, Risks and benefits discussed, pre-op evaluation,  At surgeon's request and post-op pain management  Laterality: Left  Prep: Maximum Sterile Barrier Precautions used, chloraprep       Needles:  Injection technique: Single-shot  Needle Type: Echogenic Stimulator Needle     Needle Length: 9cm  Needle Gauge: 21     Additional Needles:   Procedures:,,,, ultrasound used (permanent image in chart),,,,  Narrative:  Start time: 06/18/2020 6:54 AM End time: 06/18/2020 7:04 AM Injection made incrementally with aspirations every 5 mL.  Performed by: Personally  Anesthesiologist: Roderic Palau, MD  Additional Notes: 2% Lidocaine skin wheel.

## 2020-06-18 NOTE — Evaluation (Signed)
Physical Therapy Evaluation Patient Details Name: Joshua [name] MRN: [id number] DOB: Sep 12, 1940 Today's Date: 06/18/2020   History of Present Illness  patient is a 80 y.o. male s/p Lt TKA on 06/17/2020 with PMH significant for PVD, HTN, HLD< obesity, GERD, CAD, OA, asthma, ACDF (2008),  Clinical Impression  Pt is a 79y.o. male s/p Lt TKA POD 0. Pt reports that he is independent with mobility at baseline. Pt required MIN guard with cues for safe hand placement for sit to stand transfer. Pt required MIN assist for safety with ambulation ~32ft and verbal cues for RW management and step to gait pattern with no LOB. Pt performed safe stair negotiation with MIN assist for RW stability and cues for sequencing. Pt's wife demonstrated safe guarding position for mobility with cues from therapist. PT reviewed therapeutic intervention for promotion of DVT prevention, pt demonstrated understanding. Pt will have assistance from his wife upon discharge. Pt will benefit from skilled PT to increase independence and safety with mobility. Acute therapy to follow up during stay to progress functional mobility as able to ensure safe discharge home.       Follow Up Recommendations Home health PT;Follow surgeon's recommendation for DC plan and follow-up therapies    Equipment Recommendations  Rolling walker with 5" wheels    Recommendations for Other Services       Precautions / Restrictions Precautions Precautions: Fall Restrictions Weight Bearing Restrictions: No Other Position/Activity Restrictions: WBAT      Mobility  Bed Mobility Overal bed mobility: Needs Assistance Bed Mobility: Supine to Sit     Supine to sit: Supervision;HOB elevated     General bed mobility comments: pt with use of B UEs to scoot to EOB and supervision for safety    Transfers Overall transfer level: Needs assistance Equipment used: Rolling walker (2 wheeled) Transfers: Sit to/from Stand Sit to Stand: Min guard          General transfer comment: MIN guard for safety with cues for safe hand placement from EOB and toilet during session  Ambulation/Gait Ambulation/Gait assistance: Min assist;Min guard;Supervision Gait Distance (Feet): 70 Feet Assistive device: Rolling walker (2 wheeled) Gait Pattern/deviations: Step-to pattern;Decreased stride length;Decreased weight shift to left     General Gait Details: Pt performed pre gait marching with use of B UEs on RW and no knee buckling. MIN assist progressing to MIN guard-supervision with cues for step to gait pattern and to maintain RW in contact withthe floor at all times. pt displayed no LOB during gait. Pt's wife able to demonstrate safe guarding posiiton with cues from therapist.  Stairs Stairs: Yes Stairs assistance: Min assist Stair Management: No rails;Forwards;With walker Number of Stairs: 3 General stair comments: MIN assist for RW stability and safety with cues for sequencing "up with the good, down with the bad" Pt was able to verbalize proper sequencing technique at EOS. Pt's wife able to demonstrated proper handling for RW stability  Wheelchair Mobility    Modified Rankin (Stroke Patients Only)       Balance Overall balance assessment: Needs assistance Sitting-balance support: Feet supported;Feet unsupported Sitting balance-Leahy Scale: Good     Standing balance support: Bilateral upper extremity supported;During functional activity Standing balance-Leahy Scale: Poor Standing balance comment: use of RW to maintain standing balance                             Pertinent Vitals/Pain Pain Assessment: 0-10 Pain Score: 7  Pain Location:  Lt knee Pain Descriptors / Indicators: Sore;Discomfort Pain Intervention(s): Limited activity within patient's tolerance;Monitored during session;Repositioned    Home Living Family/patient expects to be discharged to:: Private residence Living Arrangements: Spouse/significant  other Available Help at Discharge: Family Type of Home: House Home Access: Stairs to enter Entrance Stairs-Rails: None Entrance Stairs-Number of Steps: 1 Home Layout: Two level;Able to live on main level with bedroom/bathroom Home Equipment: Cane - single point;Crutches;Shower seat;Grab bars - tub/shower Additional Comments: pt's wife is available to assist at  home and pt has a friend who lives nearby who can help if needed.    Prior Function Level of Independence: Independent               Hand Dominance   Dominant Hand: Right    Extremity/Trunk Assessment   Upper Extremity Assessment Upper Extremity Assessment: Overall WFL for tasks assessed    Lower Extremity Assessment Lower Extremity Assessment: LLE deficits/detail LLE Deficits / Details: pt with good Lt quad set strength and 4/5 B dorsi/plantar flexion strength. pt able to perform full SLR with no extensor lag noted. LLE Sensation: WNL LLE Coordination: WNL    Cervical / Trunk Assessment Cervical / Trunk Assessment: Normal  Communication   Communication: No difficulties  Cognition Arousal/Alertness: Awake/alert Behavior During Therapy: WFL for tasks assessed/performed Overall Cognitive Status: Within Functional Limits for tasks assessed                                        General Comments      Exercises Total Joint Exercises Ankle Circles/Pumps: AROM;Both;20 reps;Supine Quad Sets: AROM;Left;5 reps;Supine Heel Slides: AROM;Left;5 reps;Supine   Assessment/Plan    PT Assessment Patient needs continued PT services  PT Problem List Decreased strength;Decreased range of motion;Decreased activity tolerance;Decreased balance;Decreased mobility;Decreased knowledge of use of DME;Pain       PT Treatment Interventions DME instruction;Gait training;Stair training;Functional mobility training;Therapeutic activities;Therapeutic exercise;Balance training;Patient/family education    PT Goals  (Current goals can be found in the Care Plan section)  Acute Rehab PT Goals Patient Stated Goal: get back to going on walks PT Goal Formulation: With patient/family Time For Goal Achievement: 06/25/20 Potential to Achieve Goals: Good    Frequency 7X/week   Barriers to discharge        Co-evaluation               AM-PAC PT "6 Clicks" Mobility  Outcome Measure Help needed turning from your back to your side while in a flat bed without using bedrails?: None Help needed moving from lying on your back to sitting on the side of a flat bed without using bedrails?: A Little Help needed moving to and from a bed to a chair (including a wheelchair)?: A Little Help needed standing up from a chair using your arms (e.g., wheelchair or bedside chair)?: A Little Help needed to walk in hospital room?: A Little Help needed climbing 3-5 steps with a railing? : A Little 6 Click Score: 19    End of Session Equipment Utilized During Treatment: Gait belt Activity Tolerance: Patient tolerated treatment well Patient left: in bed;with call bell/phone within reach;with family/visitor present Nurse Communication: Mobility status PT Visit Diagnosis: Unsteadiness on feet (R26.81);Muscle weakness (generalized) (M62.81);Pain Pain - Right/Left: Left Pain - part of body: Knee    Time: 4709-6283 PT Time Calculation (min) (ACUTE ONLY): 42 min   Charges:  Elna Breslow, SPT  Acute rehab    Elna Breslow 06/18/2020, 3:25 PM

## 2020-06-18 NOTE — Op Note (Signed)
PREOP DIAGNOSIS: DJD LEFT KNEE POSTOP DIAGNOSIS:  same PROCEDURE: LEFT TKR ANESTHESIA: Spinal and MAC ATTENDING SURGEON: Hessie Dibble ASSISTANT: Loni Dolly PA  INDICATIONS FOR PROCEDURE: Joshua [name] is a 80 y.o. male who has struggled for a long time with pain due to degenerative arthritis of the left knee.  The patient has failed many conservative non-operative measures and at this point has pain which limits the ability to sleep and walk.  The patient is offered total knee replacement.  Informed operative consent was obtained after discussion of possible risks of anesthesia, infection, neurovascular injury, DVT, and death.  The importance of the post-operative rehabilitation protocol to optimize result was stressed extensively with the patient.  SUMMARY OF FINDINGS AND PROCEDURE:  Joshua [name] was taken to the operative suite where under the above anesthesia a left knee replacement was performed.  There were advanced degenerative changes and the bone quality was excellent.  We used the DePuyAttune system and placed size 7 femur, 6 tibia, 38 mm all polyethylene patella, and a size 5 mm spacer.  Loni Dolly PA-C assisted throughout and was invaluable to the completion of the case in that he helped retract and maintain exposure while I placed the components.  He also helped close thereby minimizing OR time.  The patient was admitted for appropriate post-op care to include perioperative antibiotics and mechanical and pharmacologic measures for DVT prophylaxis.  DESCRIPTION OF PROCEDURE:  Joshua [name] was taken to the operative suite where the above anesthesia was applied.  The patient was positioned supine and prepped and draped in normal sterile fashion.  An appropriate time out was performed.  After the administration of kefzol pre-op antibiotic the leg was elevated and exsanguinated and a tourniquet inflated.  A standard longitudinal incision was made on the anterior knee.   Dissection was carried down to the extensor mechanism.  All appropriate anti-infective measures were used including the pre-operative antibiotic, betadine impregnated drape, and closed hooded exhaust systems for each member of the surgical team.  A medial parapatellar incision was made in the extensor mechanism and the knee cap flipped and the knee flexed.  Some residual meniscal tissues were removed along with any remaining ACL/PCL tissue.  A guide was placed on the tibia and a flat cut was made on it's superior surface.  An intramedullary guide was placed in the femur and was utilized to make anterior and posterior cuts creating an appropriate flexion gap.  A second intramedullary guide was placed in the femur to make a distal cut properly balancing the knee with an extension gap equal to the flexion gap.  The three bones sized to the above mentioned sizes and the appropriate guides were placed and utilized.  A trial reduction was done and the knee easily came to full extension and the patella tracked well on flexion.  The trial components were removed and all bones were cleaned with pulsatile lavage and then dried thoroughly.  Cement was mixed and was pressurized onto the bones followed by placement of the aforementioned components.  Excess cement was trimmed and pressure was held on the components until the cement had hardened.  The tourniquet was deflated and a small amount of bleeding was controlled with cautery and pressure.  The knee was irrigated thoroughly.  The extensor mechanism was re-approximated with #1 ethibond in interrupted fashion.  The knee was flexed and the repair was solid.  The subcutaneous tissues were re-approximated with #0 and #2-0 vicryl and the skin  closed with a subcuticular stitch and steristrips.  A sterile dressing was applied.  Intraoperative fluids, EBL, and tourniquet time can be obtained from anesthesia records.  DISPOSITION:  The patient was taken to recovery room in stable  condition and admitted for appropriate post-op care to include peri-operative antibiotic and DVT prophylaxis with mechanical and pharmacologic measures.  [name] 06/18/2020, 9:15 AM

## 2020-06-19 ENCOUNTER — Encounter (HOSPITAL_COMMUNITY): Payer: Self-pay | Admitting: Orthopaedic Surgery

## 2020-06-19 DIAGNOSIS — I129 Hypertensive chronic kidney disease with stage 1 through stage 4 chronic kidney disease, or unspecified chronic kidney disease: Secondary | ICD-10-CM | POA: Diagnosis not present

## 2020-06-19 DIAGNOSIS — K219 Gastro-esophageal reflux disease without esophagitis: Secondary | ICD-10-CM | POA: Diagnosis not present

## 2020-06-19 DIAGNOSIS — E785 Hyperlipidemia, unspecified: Secondary | ICD-10-CM | POA: Diagnosis not present

## 2020-06-19 DIAGNOSIS — M5442 Lumbago with sciatica, left side: Secondary | ICD-10-CM | POA: Diagnosis not present

## 2020-06-19 DIAGNOSIS — F324 Major depressive disorder, single episode, in partial remission: Secondary | ICD-10-CM | POA: Diagnosis not present

## 2020-06-19 DIAGNOSIS — G2581 Restless legs syndrome: Secondary | ICD-10-CM | POA: Diagnosis not present

## 2020-06-19 DIAGNOSIS — G609 Hereditary and idiopathic neuropathy, unspecified: Secondary | ICD-10-CM | POA: Diagnosis not present

## 2020-06-19 DIAGNOSIS — M19041 Primary osteoarthritis, right hand: Secondary | ICD-10-CM | POA: Diagnosis not present

## 2020-06-19 DIAGNOSIS — I25110 Atherosclerotic heart disease of native coronary artery with unstable angina pectoris: Secondary | ICD-10-CM | POA: Diagnosis not present

## 2020-06-19 DIAGNOSIS — J309 Allergic rhinitis, unspecified: Secondary | ICD-10-CM | POA: Diagnosis not present

## 2020-06-19 DIAGNOSIS — I872 Venous insufficiency (chronic) (peripheral): Secondary | ICD-10-CM | POA: Diagnosis not present

## 2020-06-19 DIAGNOSIS — M47812 Spondylosis without myelopathy or radiculopathy, cervical region: Secondary | ICD-10-CM | POA: Diagnosis not present

## 2020-06-19 DIAGNOSIS — D509 Iron deficiency anemia, unspecified: Secondary | ICD-10-CM | POA: Diagnosis not present

## 2020-06-19 DIAGNOSIS — M1711 Unilateral primary osteoarthritis, right knee: Secondary | ICD-10-CM | POA: Diagnosis not present

## 2020-06-19 DIAGNOSIS — K589 Irritable bowel syndrome without diarrhea: Secondary | ICD-10-CM | POA: Diagnosis not present

## 2020-06-19 DIAGNOSIS — R7303 Prediabetes: Secondary | ICD-10-CM | POA: Diagnosis not present

## 2020-06-19 DIAGNOSIS — Z471 Aftercare following joint replacement surgery: Secondary | ICD-10-CM | POA: Diagnosis not present

## 2020-06-19 DIAGNOSIS — D631 Anemia in chronic kidney disease: Secondary | ICD-10-CM | POA: Diagnosis not present

## 2020-06-19 DIAGNOSIS — G4733 Obstructive sleep apnea (adult) (pediatric): Secondary | ICD-10-CM | POA: Diagnosis not present

## 2020-06-19 DIAGNOSIS — J45909 Unspecified asthma, uncomplicated: Secondary | ICD-10-CM | POA: Diagnosis not present

## 2020-06-19 DIAGNOSIS — M5136 Other intervertebral disc degeneration, lumbar region: Secondary | ICD-10-CM | POA: Diagnosis not present

## 2020-06-19 DIAGNOSIS — I739 Peripheral vascular disease, unspecified: Secondary | ICD-10-CM | POA: Diagnosis not present

## 2020-06-19 DIAGNOSIS — N183 Chronic kidney disease, stage 3 unspecified: Secondary | ICD-10-CM | POA: Diagnosis not present

## 2020-06-19 DIAGNOSIS — I700 Atherosclerosis of aorta: Secondary | ICD-10-CM | POA: Diagnosis not present

## 2020-06-21 ENCOUNTER — Telehealth: Payer: Self-pay

## 2020-06-21 DIAGNOSIS — R3914 Feeling of incomplete bladder emptying: Secondary | ICD-10-CM | POA: Diagnosis not present

## 2020-06-21 DIAGNOSIS — N35013 Post-traumatic anterior urethral stricture: Secondary | ICD-10-CM | POA: Diagnosis not present

## 2020-06-21 DIAGNOSIS — R339 Retention of urine, unspecified: Secondary | ICD-10-CM | POA: Diagnosis not present

## 2020-06-21 DIAGNOSIS — Z96659 Presence of unspecified artificial knee joint: Secondary | ICD-10-CM | POA: Diagnosis not present

## 2020-06-21 NOTE — Telephone Encounter (Signed)
Consider follow-up with [place] if cannot get in contact with surgeon.  Saturday clinic would also be an option but could not collect urine which sounds like may be an important step.  Urgent care would also be an option for the weekend

## 2020-06-21 NOTE — Telephone Encounter (Cosign Needed)
Transition Care Management Follow-up Telephone Call  Date of discharge and from where: 06/18/20 [facility]  How have you been since you were released from the hospital? Not too good today having trouble urinating and constipated   Any questions or concerns? Yes , wife June, has followed up with Surgeons office   Items Reviewed:  Did the pt receive and understand the discharge instructions provided? Yes   Medications obtained and verified? Yes   Other? No   Any new allergies since your discharge? No   Dietary orders reviewed? Yes  Do you have support at home? Yes   Home Care and Equipment/Supplies: Were home health services ordered? yes If so, what is the name of the agency?   Has the agency set up a time to come to the patient's home? yes Were any new equipment or medical supplies ordered?  Yes: rolling walker and PT What is the name of the medical supply agency?  Were you able to get the supplies/equipment? yes Do you have any questions related to the use of the equipment or supplies? No  Functional Questionnaire: (I = Independent and D = Dependent) ADLs: I  Bathing/Dressing- D  Meal Prep- D  Eating- I  Maintaining continence- I  Transferring/Ambulation- I  Managing Meds- I  Follow up appointments reviewed:   PCP Hospital f/u appt confirmed? Yes  Scheduled to see Dr Yong Channel  on 06/26/20.  [facility] f/u appt confirmed? Yes  Scheduled to see Surgeon  on 06/28/20.  Are transportation arrangements needed? No   If their condition worsens, is the pt aware to call PCP or go to the Emergency Dept.? Yes  Was the patient provided with contact information for the PCP's office or ED? Yes  Was to pt encouraged to call back with questions or concerns? Yes

## 2020-06-21 NOTE — Patient Instructions (Incomplete)
Depression screen [facility] 2/9 05/06/2020 02/23/2020 12/25/2019  Decreased Interest 0 0 0  Down, Depressed, Hopeless 0 1 0  PHQ - 2 Score 0 1 0  Altered sleeping - 3 0  Tired, decreased energy - 3 0  Change in appetite - 0 0  Feeling bad or failure about yourself  - 1 1  Trouble concentrating - 0 0  Moving slowly or fidgety/restless - 0 0  Suicidal thoughts - 0 0  PHQ-9 Score - 8 1  Difficult doing work/chores - Not difficult at all Not difficult at all  Some recent data might be hidden

## 2020-06-21 NOTE — Progress Notes (Deleted)
Phone [phone] In person visit   Subjective:   Joshua [name] is a 80 y.o. year old very pleasant male patient who presents for/with See problem oriented charting No chief complaint on file.   This visit occurred during the SARS-CoV-2 public health emergency.  Safety protocols were in place, including screening questions prior to the visit, additional usage of staff PPE, and extensive cleaning of exam room while observing appropriate contact time as indicated for disinfecting solutions.   Past Medical History-  Patient Active Problem List   Diagnosis Date Noted  . Primary osteoarthritis of left knee 06/18/2020  . Pre-operative respiratory examination 06/05/2020  . Coronary artery disease involving coronary bypass graft of native heart without angina pectoris 04/07/2020  . Trigger thumb, left thumb 01/24/2020  . Overactive bladder 11/24/2019  . Left lateral epicondylitis 09/05/2019  . Degenerative arthritis of left knee 08/08/2019  . Educated about COVID-19 virus infection 04/20/2019  . Unstable angina ([place])   . Greater trochanteric bursitis of left hip 10/27/2018  . Degenerative disc disease, lumbar 10/27/2018  . Abdominal muscle strain, initial encounter 06/16/2018  . Aortic atherosclerosis ([place]) 05/05/2018  . Hyperglycemia 10/07/2017  . Weakness of left leg 06/21/2017  . DJD (degenerative joint disease)   . Arthritis   . Neck pain 02/08/2017  . Venous insufficiency 12/29/2016  . Ganglion cyst of tendon sheath of right hand 12/29/2016  . Avulsion fracture of metatarsal bone of right foot 11/25/2016  . Gout 11/20/2016  . Obesity, Class I, BMI 30-34.9 10/03/2015  . Hereditary and idiopathic peripheral neuropathy 09/25/2014  . Allergic rhinitis 05/31/2014  . Fatigue 11/09/2013  . CKD (chronic kidney disease), stage III ([place]) 10/23/2013  . Dizziness 08/23/2013  . Neuropathy ([place]) 08/21/2013  . RLS (restless legs syndrome) 01/03/2013  . Chronic bilateral low back pain  with left-sided sciatica 10/09/2011  . History of bladder cancer 04/01/2010  . OSTEOARTHRITIS, LOWER LEG, LEFT 10/04/2009  . ANEMIA, IRON DEFICIENCY 11/06/2008  . 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 Essential hypertension 02/21/2007  . OSA (obstructive sleep apnea) 11/02/2006  . Hyperlipidemia 10/12/2006  . Depression, major, single episode, in partial remission ([place]) 10/12/2006  . Coronary atherosclerosis 10/12/2006  . Asthma 10/12/2006  . GERD 10/12/2006  . Irritable bowel syndrome 10/12/2006    Medications- reviewed and updated Current Outpatient Medications  Medication Sig Dispense Refill  . acetaminophen (TYLENOL) 500 MG tablet Take 1,000 mg by mouth every 6 (six) hours as needed for moderate pain or headache.    . albuterol (PROAIR HFA) 108 (90 BASE) MCG/ACT inhaler Inhale 1-2 puffs into the lungs every 6 (six) hours as needed for wheezing or shortness of breath. 18 g 3  . amLODipine (NORVASC) 5 MG tablet TAKE 1 TABLET BY MOUTH EVERY DAY (Patient taking differently: Take 5 mg by mouth daily.) 90 tablet 3  . aspirin EC 81 MG tablet Take 1 tablet (81 mg total) by mouth 2 (two) times daily after a meal. 30 tablet 11  . cholecalciferol (VITAMIN D3) 25 MCG (1000 UNIT) tablet Take 1,000 Units by mouth daily.    . clotrimazole-betamethasone (LOTRISONE) cream Apply 1 application topically See admin instructions. Twice daily every 4 days    . Cyanocobalamin (B-12) 2500 MCG TABS Take 1,250 mcg by mouth daily.    . diazepam (VALIUM) 5 MG tablet TAKE 1 TABLET (5 MG TOTAL) BY MOUTH AT BEDTIME AS NEEDED. (Patient taking differently: Take 5 mg by mouth at bedtime as needed (sleep).) 30 tablet 1  . EPINEPHrine  0.3 mg/0.3 mL IJ SOAJ injection Inject 0.3 mg into the muscle as needed for anaphylaxis. for allergic reaction    . ezetimibe (ZETIA) 10 MG tablet Take 1 tablet (10 mg total) by mouth daily. 90 tablet 3  . FLOVENT HFA 110 MCG/ACT inhaler Inhale 1 puff into the lungs 2 (two) times  daily.   5  . fluticasone (FLONASE) 50 MCG/ACT nasal spray Place 1 spray into both nostrils 2 (two) times daily.     . folic acid (FOLVITE) 194 MCG tablet Take 800 mcg by mouth daily.     [name] gabapentin (NEURONTIN) 300 MG capsule Take 1 cap in AM, 1 cap at noon, 2 caps at bedtime (Patient taking differently: Take 300-600 mg by mouth See admin instructions. Take 300 mg in the morning, 300 mg at noon, and 600 mg at bedtime) 360 capsule 3  . Glucos-Chond-Hyal Ac-Ca Fructo (MOVE FREE JOINT HEALTH ADVANCE PO) Take 1 tablet by mouth daily.    . hydrochlorothiazide (HYDRODIURIL) 25 MG tablet TAKE 1 TABLET BY MOUTH EVERY DAY (Patient taking differently: Take 25 mg by mouth daily.) 90 tablet 2  . HYDROmorphone (DILAUDID) 2 MG tablet Take 1-2 tablets (2-4 mg total) by mouth every 6 (six) hours as needed for up to 5 days for moderate pain or severe pain (post op pain). 40 tablet 0  . ketoconazole (NIZORAL) 2 % cream Apply 1 application topically See admin instructions. Twice a day every other day    . loratadine (CLARITIN) 10 MG tablet Take 10 mg by mouth at bedtime.     [name] losartan (COZAAR) 100 MG tablet TAKE 1 TABLET BY MOUTH EVERY DAY (Patient taking differently: Take 100 mg by mouth daily.) 90 tablet 3  . Miconazole Nitrate (LOTRIMIN AF DEODORANT POWDER) 2 % AERP Apply 1 spray topically daily as needed (jock itch).    . nitroGLYCERIN (NITROSTAT) 0.4 MG SL tablet PLACE 1 TABLET UNDER THE TONGUE EVERY 5 (FIVE) MINUTES AS NEEDED. (Patient taking differently: Place 0.4 mg under the tongue every 5 (five) minutes as needed for chest pain.) 75 tablet 1  . nystatin-triamcinolone ointment (MYCOLOG) Apply 1 application topically See admin instructions. Twice daily every 4 days    . pantoprazole (PROTONIX) 40 MG tablet TAKE 1 TABLET BY MOUTH EVERY DAY (Patient taking differently: Take 40 mg by mouth daily.) 90 tablet 1  . polyethylene glycol powder (GLYCOLAX/MIRALAX) powder Take 17 g by mouth daily. (Patient taking  differently: Take 17 g by mouth in the morning and at bedtime.) 255 g 0  . simvastatin (ZOCOR) 20 MG tablet TAKE 1 TABLET BY MOUTH EVERYDAY AT BEDTIME (Patient taking differently: Take 20 mg by mouth at bedtime.) 90 tablet 3  . tiZANidine (ZANAFLEX) 4 MG tablet Take 1 tablet (4 mg total) by mouth every 6 (six) hours as needed for muscle spasms. 40 tablet 1  . traMADol (ULTRAM) 50 MG tablet Take 1 tablet (50 mg total) by mouth every 6 (six) hours as needed. (Patient taking differently: Take 50 mg by mouth every 6 (six) hours as needed for moderate pain.) 20 tablet 1  . Wheat Dextrin (BENEFIBER) POWD Take 1 Dose by mouth daily. 1 dose = 2 teaspoons     No current facility-administered medications for this visit.     Objective:  There were no vitals taken for this visit. Gen: NAD, resting comfortably CV: RRR no murmurs rubs or gallops Lungs: CTAB no crackles, wheeze, rhonchi Abdomen: soft/nontender/nondistended/normal bowel sounds. No rebound or guarding.  Ext:  no edema Skin: warm, dry Neuro: grossly normal, moves all extremities  ***    Assessment and Plan  *** Medicare AWVS: 02/06/19*** sched december CPE 02/23/20 ***  ***off repatha june 2021- fatigue- seeing Dr. 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   #CAD/hyperlipidemia-history of CABG x5- follows with Dr. Percival Spanish S: Fatigue on Repatha in 2021-restarted on simvastatin 20 mg with plan for Zetia 10 mg p.o. as well.[name]  He is compliant with aspirin and Plavix as well.   -Stent drug eluting placed 04/13/19 Dr. Angelena Form A/P: ***    #Hypertension/CKD stage III S: Compliant with amlodipine 5 mg, hydrochlorothiazide 25 mg and losartan 100 mg -GFR has been stable.  On ARB in case proteinuric element A/P: ***    #Depression, major,  single episode-remains in partial remission S: Patient has not tolerated medication in the past-feels too tired and sleepy on SSRIs -Has declined counseling and continues to decline counseling-has done this in the past and only somewhat helpful. A/P: ***   % Neuropathy-follows with [name] S: Compliant with gabapentin.  Burning/needle pain in the feet***  A/P: ***   #Hyperglycemia history of mildly elevated A1c.  Prediabetes range.   #Senile purpura-easy bruising and bleeding on aspirin.  ***  #Aortic atherosclerosis-Noted on CT 2016. ***  #b12 deficiency-on oral B12.  Change to every other day August 26, 2019   #Incontinence/overactive bladder- in patient with history of prostate/bladder cancer fully treated S:Issues date back to prostatectomy and bladder cancer cautery in 2012.  Worsening symptoms 2021 and wanted to try medication-recommended against oxybutynin due to beers list and potential increased fatigue-opted to try Myrbetriq** A/P: ***    ***Dizziness resolved off Lexapro-see neurology visit 05/01/2020  No problem-specific Assessment & Plan notes found for this encounter.   Recommended follow up: ***No follow-ups on file. Future Appointments  Date Time Provider [place]  06/26/2020 11:00 AM [name], MD [facility]  09/11/2020 11:00 AM [facility] CCM PHARMACIST [facility]  01/08/2021  8:30 AM Cameron Sprang, MD [facility] None  05/15/2021  9:30 AM [facility] HEALTH COACH [facility]    Lab/Order associations: No diagnosis found.  No orders of the defined types were placed in this encounter.   Time Spent: *** minutes of total time (3:05 PM***- 3:05 PM***) was spent on the date of the encounter performing the following actions: chart review prior to seeing the patient, obtaining history, performing a medically necessary exam, counseling on the treatment plan, placing orders, and documenting in our EHR.   Return precautions advised.  Clyde Lundborg, CMA

## 2020-06-22 ENCOUNTER — Emergency Department (HOSPITAL_COMMUNITY): Payer: PPO

## 2020-06-22 ENCOUNTER — Encounter (HOSPITAL_COMMUNITY): Payer: Self-pay

## 2020-06-22 ENCOUNTER — Emergency Department (HOSPITAL_COMMUNITY)
Admission: EM | Admit: 2020-06-22 | Discharge: 2020-06-22 | Disposition: A | Discharge status: Home / Self Care | Payer: PPO | Attending: Emergency Medicine | Admitting: Emergency Medicine

## 2020-06-22 DIAGNOSIS — M47812 Spondylosis without myelopathy or radiculopathy, cervical region: Secondary | ICD-10-CM | POA: Diagnosis not present

## 2020-06-22 DIAGNOSIS — Z8551 Personal history of malignant neoplasm of bladder: Secondary | ICD-10-CM | POA: Insufficient documentation

## 2020-06-22 DIAGNOSIS — G609 Hereditary and idiopathic neuropathy, unspecified: Secondary | ICD-10-CM | POA: Diagnosis not present

## 2020-06-22 DIAGNOSIS — Z96652 Presence of left artificial knee joint: Secondary | ICD-10-CM | POA: Diagnosis not present

## 2020-06-22 DIAGNOSIS — Z87891 Personal history of nicotine dependence: Secondary | ICD-10-CM | POA: Diagnosis not present

## 2020-06-22 DIAGNOSIS — M1711 Unilateral primary osteoarthritis, right knee: Secondary | ICD-10-CM | POA: Diagnosis not present

## 2020-06-22 DIAGNOSIS — G4733 Obstructive sleep apnea (adult) (pediatric): Secondary | ICD-10-CM | POA: Diagnosis not present

## 2020-06-22 DIAGNOSIS — D631 Anemia in chronic kidney disease: Secondary | ICD-10-CM | POA: Diagnosis not present

## 2020-06-22 DIAGNOSIS — I25110 Atherosclerotic heart disease of native coronary artery with unstable angina pectoris: Secondary | ICD-10-CM | POA: Diagnosis not present

## 2020-06-22 DIAGNOSIS — J9811 Atelectasis: Secondary | ICD-10-CM | POA: Diagnosis not present

## 2020-06-22 DIAGNOSIS — I10 Essential (primary) hypertension: Secondary | ICD-10-CM | POA: Diagnosis not present

## 2020-06-22 DIAGNOSIS — Z471 Aftercare following joint replacement surgery: Secondary | ICD-10-CM | POA: Diagnosis not present

## 2020-06-22 DIAGNOSIS — R7303 Prediabetes: Secondary | ICD-10-CM | POA: Diagnosis not present

## 2020-06-22 DIAGNOSIS — J45909 Unspecified asthma, uncomplicated: Secondary | ICD-10-CM | POA: Insufficient documentation

## 2020-06-22 DIAGNOSIS — R0902 Hypoxemia: Secondary | ICD-10-CM | POA: Diagnosis not present

## 2020-06-22 DIAGNOSIS — R509 Fever, unspecified: Secondary | ICD-10-CM | POA: Diagnosis not present

## 2020-06-22 DIAGNOSIS — D509 Iron deficiency anemia, unspecified: Secondary | ICD-10-CM | POA: Diagnosis not present

## 2020-06-22 DIAGNOSIS — Z7952 Long term (current) use of systemic steroids: Secondary | ICD-10-CM | POA: Diagnosis not present

## 2020-06-22 DIAGNOSIS — R000 Tachycardia, unspecified: Secondary | ICD-10-CM | POA: Diagnosis not present

## 2020-06-22 DIAGNOSIS — Z951 Presence of aortocoronary bypass graft: Secondary | ICD-10-CM | POA: Insufficient documentation

## 2020-06-22 DIAGNOSIS — I129 Hypertensive chronic kidney disease with stage 1 through stage 4 chronic kidney disease, or unspecified chronic kidney disease: Secondary | ICD-10-CM | POA: Diagnosis not present

## 2020-06-22 DIAGNOSIS — R5082 Postprocedural fever: Secondary | ICD-10-CM | POA: Insufficient documentation

## 2020-06-22 DIAGNOSIS — Z79899 Other long term (current) drug therapy: Secondary | ICD-10-CM | POA: Insufficient documentation

## 2020-06-22 DIAGNOSIS — Z7982 Long term (current) use of aspirin: Secondary | ICD-10-CM | POA: Diagnosis not present

## 2020-06-22 DIAGNOSIS — Z8546 Personal history of malignant neoplasm of prostate: Secondary | ICD-10-CM | POA: Diagnosis not present

## 2020-06-22 DIAGNOSIS — W06XXXA Fall from bed, initial encounter: Secondary | ICD-10-CM | POA: Insufficient documentation

## 2020-06-22 DIAGNOSIS — L539 Erythematous condition, unspecified: Secondary | ICD-10-CM | POA: Insufficient documentation

## 2020-06-22 DIAGNOSIS — J309 Allergic rhinitis, unspecified: Secondary | ICD-10-CM | POA: Diagnosis not present

## 2020-06-22 DIAGNOSIS — K219 Gastro-esophageal reflux disease without esophagitis: Secondary | ICD-10-CM | POA: Diagnosis not present

## 2020-06-22 DIAGNOSIS — R531 Weakness: Secondary | ICD-10-CM | POA: Diagnosis not present

## 2020-06-22 DIAGNOSIS — I2510 Atherosclerotic heart disease of native coronary artery without angina pectoris: Secondary | ICD-10-CM | POA: Insufficient documentation

## 2020-06-22 DIAGNOSIS — N183 Chronic kidney disease, stage 3 unspecified: Secondary | ICD-10-CM | POA: Diagnosis not present

## 2020-06-22 DIAGNOSIS — M5136 Other intervertebral disc degeneration, lumbar region: Secondary | ICD-10-CM | POA: Diagnosis not present

## 2020-06-22 DIAGNOSIS — G2581 Restless legs syndrome: Secondary | ICD-10-CM | POA: Diagnosis not present

## 2020-06-22 DIAGNOSIS — E785 Hyperlipidemia, unspecified: Secondary | ICD-10-CM | POA: Diagnosis not present

## 2020-06-22 DIAGNOSIS — K589 Irritable bowel syndrome without diarrhea: Secondary | ICD-10-CM | POA: Diagnosis not present

## 2020-06-22 DIAGNOSIS — R0689 Other abnormalities of breathing: Secondary | ICD-10-CM | POA: Diagnosis not present

## 2020-06-22 DIAGNOSIS — I872 Venous insufficiency (chronic) (peripheral): Secondary | ICD-10-CM | POA: Diagnosis not present

## 2020-06-22 DIAGNOSIS — M5442 Lumbago with sciatica, left side: Secondary | ICD-10-CM | POA: Diagnosis not present

## 2020-06-22 DIAGNOSIS — I700 Atherosclerosis of aorta: Secondary | ICD-10-CM | POA: Diagnosis not present

## 2020-06-22 DIAGNOSIS — I959 Hypotension, unspecified: Secondary | ICD-10-CM | POA: Diagnosis not present

## 2020-06-22 DIAGNOSIS — I739 Peripheral vascular disease, unspecified: Secondary | ICD-10-CM | POA: Diagnosis not present

## 2020-06-22 DIAGNOSIS — M19041 Primary osteoarthritis, right hand: Secondary | ICD-10-CM | POA: Diagnosis not present

## 2020-06-22 DIAGNOSIS — F324 Major depressive disorder, single episode, in partial remission: Secondary | ICD-10-CM | POA: Diagnosis not present

## 2020-06-22 LAB — CBC WITH DIFFERENTIAL/PLATELET
Abs Immature Granulocytes: 0.18 10*3/uL — ABNORMAL HIGH (ref 0.00–0.07)
Basophils Absolute: 0 10*3/uL (ref 0.0–0.1)
Basophils Relative: 0 %
Eosinophils Absolute: 0.1 10*3/uL (ref 0.0–0.5)
Eosinophils Relative: 1 %
HCT: 32.5 % — ABNORMAL LOW (ref 39.0–52.0)
Hemoglobin: 11.5 g/dL — ABNORMAL LOW (ref 13.0–17.0)
Immature Granulocytes: 2 %
Lymphocytes Relative: 10 %
Lymphs Abs: 1.1 10*3/uL (ref 0.7–4.0)
MCH: 31.6 pg (ref 26.0–34.0)
MCHC: 35.4 g/dL (ref 30.0–36.0)
MCV: 89.3 fL (ref 80.0–100.0)
Monocytes Absolute: 1.3 10*3/uL — ABNORMAL HIGH (ref 0.1–1.0)
Monocytes Relative: 11 %
Neutro Abs: 9 10*3/uL — ABNORMAL HIGH (ref 1.7–7.7)
Neutrophils Relative %: 76 %
Platelets: 209 10*3/uL (ref 150–400)
RBC: 3.64 MIL/uL — ABNORMAL LOW (ref 4.22–5.81)
RDW: 13.1 % (ref 11.5–15.5)
WBC: 11.7 10*3/uL — ABNORMAL HIGH (ref 4.0–10.5)
nRBC: 0 % (ref 0.0–0.2)

## 2020-06-22 LAB — URINALYSIS, ROUTINE W REFLEX MICROSCOPIC
Bilirubin Urine: NEGATIVE
Glucose, UA: NEGATIVE mg/dL
Hgb urine dipstick: NEGATIVE
Ketones, ur: NEGATIVE mg/dL
Leukocytes,Ua: NEGATIVE
Nitrite: NEGATIVE
Protein, ur: NEGATIVE mg/dL
Specific Gravity, Urine: 1 — ABNORMAL LOW (ref 1.005–1.030)
pH: 5 (ref 5.0–8.0)

## 2020-06-22 LAB — COMPREHENSIVE METABOLIC PANEL
ALT: 21 U/L (ref 0–44)
AST: 19 U/L (ref 15–41)
Albumin: 2.9 g/dL — ABNORMAL LOW (ref 3.5–5.0)
Alkaline Phosphatase: 46 U/L (ref 38–126)
Anion gap: 9 (ref 5–15)
BUN: 23 mg/dL (ref 8–23)
CO2: 24 mmol/L (ref 22–32)
Calcium: 8.5 mg/dL — ABNORMAL LOW (ref 8.9–10.3)
Chloride: 99 mmol/L (ref 98–111)
Creatinine, Ser: 1.46 mg/dL — ABNORMAL HIGH (ref 0.61–1.24)
GFR, Estimated: 49 mL/min — ABNORMAL LOW (ref >60)
Glucose, Bld: 125 mg/dL — ABNORMAL HIGH (ref 70–99)
Potassium: 3 mmol/L — ABNORMAL LOW (ref 3.5–5.1)
Sodium: 132 mmol/L — ABNORMAL LOW (ref 135–145)
Total Bilirubin: 1.5 mg/dL — ABNORMAL HIGH (ref 0.3–1.2)
Total Protein: 5.9 g/dL — ABNORMAL LOW (ref 6.5–8.1)

## 2020-06-22 LAB — LACTIC ACID, PLASMA: Lactic Acid, Venous: 1.4 mmol/L (ref 0.5–1.9)

## 2020-06-22 LAB — SEDIMENTATION RATE: Sed Rate: 55 mm/hr — ABNORMAL HIGH (ref 0–16)

## 2020-06-22 LAB — C-REACTIVE PROTEIN: CRP: 19.9 mg/dL — ABNORMAL HIGH (ref <1.0)

## 2020-06-22 MED ORDER — IOHEXOL 350 MG/ML SOLN
100.0000 mL | Freq: Once | INTRAVENOUS | Status: AC | PRN
  Administered 2020-06-22: 100 mL via INTRAVENOUS

## 2020-06-22 MED ORDER — SODIUM CHLORIDE 0.9 % IV BOLUS
1000.0000 mL | Freq: Once | INTRAVENOUS | Status: AC
  Administered 2020-06-22: 1000 mL via INTRAVENOUS

## 2020-06-22 MED ORDER — HYDROMORPHONE HCL 1 MG/ML IJ SOLN
1.0000 mg | Freq: Once | INTRAMUSCULAR | Status: AC
  Administered 2020-06-22: 1 mg via INTRAVENOUS
  Filled 2020-06-22: qty 1

## 2020-06-22 NOTE — ED Provider Notes (Signed)
[facility] DEPT Provider Note: Georgena Spurling, MD, FACEP  CSN: [id number] MRN: [id number] ARRIVAL: 06/22/20 at 0101 ROOM: WA08/WA08   CHIEF COMPLAINT  Code Sepsis   HISTORY OF PRESENT ILLNESS  06/22/20 1:27 AM EXCELL NEYLAND is a 80 y.o. male who underwent left total knee arthroplasty on 06/18/2020 by Dr. Levora Dredge.  This morning he fell out of bed and fell into such a position that he could not get get up by himself.  He denies injury.  EMS was able to get him off the ground and they found his initial blood pressure was 70/40 with an oral temperature over 100.  He was also tachycardic and had an oxygen saturation of 86 to 89% on room air.  The patient denies feeling febrile or dyspneic but does feel chilled.  Rectal temperature here is 100.6 and the sepsis protocol was initiated.  His left knee is noted to be erythematous but not significantly tender.   Past Medical History:  Diagnosis Date  . Acute medial meniscal tear   . Anemia 2015  . Arthritis    "middle finger right hand; right knee; neck" (02/06/2014)  . Asthma    seasonal  . Bladder cancer ([place]) 04/2010   "cauterized during prostate OR"  . CAD (coronary artery disease)    CABG 2001  . Diverticulosis   . DJD (degenerative joint disease)    BACK  . GERD (gastroesophageal reflux disease)   . H/O inguinal hernia repair 12/2018  . History of gout   . Hyperlipidemia   . Hypertension   . IBS (irritable bowel syndrome)   . Obesity   . OSA (obstructive sleep apnea)    USES CPAP   . Pancreatitis ~ 1980  . Peripheral vascular disease ([place])   . Pre-diabetes   . Prostate cancer ([place]) 04/2010    Past Surgical History:  Procedure Laterality Date  . ANTERIOR CERVICAL DECOMP/DISCECTOMY FUSION  05/26/2006   PLATE PLACED  . BACK SURGERY    . CARDIAC CATHETERIZATION  11/20/1999  . CORONARY ARTERY BYPASS GRAFT  11/20/1999   SVG-RI1-RI2, SVG-OM, SVG-dRCA  . CORONARY STENT INTERVENTION N/A 04/13/2019   Procedure:  CORONARY STENT INTERVENTION;  Surgeon: Burnell Blanks, MD;  Location: [facility];  Service: Cardiovascular;  Laterality: N/A;  . HERNIA REPAIR    . INGUINAL HERNIA REPAIR Left 01/02/2019   Procedure: OPEN LEFT INGUINAL HERNIA REPAIR WITH MESH;  Surgeon: Johnathan Hausen, MD;  Location: [facility];  Service: General;  Laterality: Left;  . KNEE ARTHROSCOPY Right 1992; 04/2008  . LAPAROSCOPIC CHOLECYSTECTOMY  2007  . LEFT HEART CATH AND CORS/GRAFTS ANGIOGRAPHY N/A 04/13/2019   Procedure: LEFT HEART CATH AND CORS/GRAFTS ANGIOGRAPHY;  Surgeon: Burnell Blanks, MD;  Location: [facility];  Service: Cardiovascular;  Laterality: N/A;  . NASAL SEPTUM SURGERY Right 2007  . ROBOT ASSISTED LAPAROSCOPIC RADICAL PROSTATECTOMY  04/2010  . THYROIDECTOMY, PARTIAL  1980  . 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   Procedure: LEFT TOTAL KNEE ARTHROPLASTY;  Surgeon: Melrose Nakayama, MD;  Location: [facility];  Service: Orthopedics;  Laterality: Left;    Family History  Problem Relation Age of Onset  . Heart disease Mother   . Hyperlipidemia Mother   . Heart disease Father   . Hyperlipidemia Father   . Diabetes Brother     Social History   Tobacco Use  . Smoking status: Former Smoker    Packs/day: 1.00    Years: 35.00    Pack years:  35.00    Types: Cigarettes    Quit date: 06/16/1992    Years since quitting: 28.0  . Smokeless tobacco: Never Used  Vaping Use  . Vaping Use: Never used  Substance Use Topics  . Alcohol use: Yes    Alcohol/week: 2.0 standard drinks    Types: 2 Shots of liquor per week  . Drug use: No    Prior to Admission medications   Medication Sig Start Date End Date Taking? Authorizing Provider  acetaminophen (TYLENOL) 500 MG tablet Take 1,000 mg by mouth every 6 (six) hours as needed for moderate pain or headache.   Yes [provider]  albuterol (PROAIR HFA) 108 (90 BASE) MCG/ACT inhaler Inhale 1-2 puffs into the lungs every 6 (six) hours as  needed for wheezing or shortness of breath. 10/19/14  Yes [name], MD  amLODipine (NORVASC) 5 MG tablet TAKE 1 TABLET BY MOUTH EVERY DAY Patient taking differently: Take 5 mg by mouth daily. 07/25/19  Yes Kroeger, Lorelee Cover., PA-C  aspirin EC 81 MG tablet Take 1 tablet (81 mg total) by mouth 2 (two) times daily after a meal. 06/18/20  Yes Nida, Mitzi Hansen, PA-C  cholecalciferol (VITAMIN D3) 25 MCG (1000 UNIT) tablet Take 1,000 Units by mouth daily.   Yes [provider]  clotrimazole-betamethasone (LOTRISONE) cream Apply 1 application topically See admin instructions. Twice daily every 4 days   Yes [provider]  Cyanocobalamin (B-12) 2500 MCG TABS Take 1,250 mcg by mouth daily.   Yes [provider]  diazepam (VALIUM) 5 MG tablet TAKE 1 TABLET (5 MG TOTAL) BY MOUTH AT BEDTIME AS NEEDED. Patient taking differently: Take 5 mg by mouth at bedtime as needed (sleep). 04/22/20  Yes [name], MD  EPINEPHrine 0.3 mg/0.3 mL IJ SOAJ injection Inject 0.3 mg into the muscle as needed for anaphylaxis. for allergic reaction 04/24/19  Yes [provider]  ezetimibe (ZETIA) 10 MG tablet Take 1 tablet (10 mg total) by mouth daily. 02/09/20 05/09/20 Yes Hilty, Nadean Corwin, MD  FLOVENT HFA 110 MCG/ACT inhaler Inhale 1 puff into the lungs 2 (two) times daily.  07/17/14  Yes [provider]  fluticasone (FLONASE) 50 MCG/ACT nasal spray Place 1 spray into both nostrils 2 (two) times daily.  09/05/12  Yes Ricard Dillon, MD  folic acid (FOLVITE) 950 MCG tablet Take 800 mcg by mouth daily.    Yes [provider]  gabapentin (NEURONTIN) 300 MG capsule Take 1 cap in AM, 1 cap at noon, 2 caps at bedtime Patient taking differently: Take 300-600 mg by mouth See admin instructions. Take 300 mg in the morning, 300 mg at noon, and 600 mg at bedtime 05/01/20  Yes Cameron Sprang, MD  Glucos-Chond-Hyal Ac-Ca Fructo (MOVE FREE JOINT HEALTH ADVANCE PO) Take 1 tablet by mouth  daily.   Yes [provider]  hydrochlorothiazide (HYDRODIURIL) 25 MG tablet TAKE 1 TABLET BY MOUTH EVERY DAY Patient taking differently: Take 25 mg by mouth daily. 03/29/20  Yes [name], MD  HYDROmorphone (DILAUDID) 2 MG tablet Take 1-2 tablets (2-4 mg total) by mouth every 6 (six) hours as needed for up to 5 days for moderate pain or severe pain (post op pain). 06/18/20 06/23/20 Yes Loni Dolly, PA-C  ketoconazole (NIZORAL) 2 % cream Apply 1 application topically See admin instructions. Twice a day every other day 02/01/19  Yes [provider]  loratadine (CLARITIN) 10 MG tablet Take 10 mg by mouth at bedtime.  Yes [provider]  losartan (COZAAR) 100 MG tablet TAKE 1 TABLET BY MOUTH EVERY DAY Patient taking differently: Take 100 mg by mouth daily. 09/20/19  Yes [name], MD  Miconazole Nitrate (LOTRIMIN AF DEODORANT POWDER) 2 % AERP Apply 1 spray topically daily as needed (jock itch).   Yes [provider]  nitroGLYCERIN (NITROSTAT) 0.4 MG SL tablet PLACE 1 TABLET UNDER THE TONGUE EVERY 5 (FIVE) MINUTES AS NEEDED. Patient taking differently: Place 0.4 mg under the tongue every 5 (five) minutes as needed for chest pain. 03/11/20  Yes [name], MD  nystatin-triamcinolone ointment [facility]) Apply 1 application topically See admin instructions. Twice daily every 4 days   Yes [provider]  pantoprazole (PROTONIX) 40 MG tablet TAKE 1 TABLET BY MOUTH EVERY DAY Patient taking differently: Take 40 mg by mouth daily. 12/19/19  Yes Kroeger, Daleen Snook M., PA-C  polyethylene glycol powder (GLYCOLAX/MIRALAX) powder Take 17 g by mouth daily. Patient taking differently: Take 17 g by mouth in the morning and at bedtime. 08/29/14  Yes Tanna Furry, MD  simvastatin (ZOCOR) 20 MG tablet TAKE 1 TABLET BY MOUTH EVERYDAY AT BEDTIME Patient taking differently: Take 20 mg by mouth at bedtime. 03/14/20  Yes [name], MD  Wheat Dextrin (BENEFIBER)  POWD Take 1 Dose by mouth daily. 1 dose = 2 teaspoons   Yes [provider]  tiZANidine (ZANAFLEX) 4 MG tablet Take 1 tablet (4 mg total) by mouth every 6 (six) hours as needed for muscle spasms. 06/18/20 06/18/21  Loni Dolly, PA-C  traMADol (ULTRAM) 50 MG tablet Take 1 tablet (50 mg total) by mouth every 6 (six) hours as needed. Patient taking differently: Take 50 mg by mouth every 6 (six) hours as needed for moderate pain. 01/02/19   Johnathan Hausen, MD    Allergies Benadryl [diphenhydramine], Bromfed, Clarithromycin, Codeine, Doxycycline hyclate, Modafinil, Oxycodone-acetaminophen, Promethazine hcl, Quinolones, Telithromycin, Hydrocodone-acetaminophen, Statins, and Sulfonamide derivatives   REVIEW OF SYSTEMS  Negative except as noted here or in the History of Present Illness.   PHYSICAL EXAMINATION  Initial Vital Signs Blood pressure (!) 145/68, pulse (!) 106, temperature (!) 100.6 F (38.1 C), temperature source Rectal, resp. rate 16, SpO2 96 %.  Examination General: Well-developed, well-nourished male in no acute distress; appearance consistent with age of record HENT: normocephalic; atraumatic Eyes: pupils equal, round and reactive to light; extraocular muscles intact Neck: supple Heart: regular rate and rhythm tachycardia Lungs: clear to auscultation bilaterally Abdomen: soft; nondistended; nontender; bowel sounds present Extremities: No deformity; postoperative bandage in place left knee with surrounding edema, erythema and warmth:    Neurologic: Awake, alert and oriented; motor function intact in all extremities and symmetric; no facial droop Skin: Warm and dry Psychiatric: Normal mood and affect   RESULTS  Summary of this visit's results, reviewed and interpreted by myself:   EKG Interpretation  Date/Time:    Ventricular Rate:    PR Interval:    QRS Duration:   QT Interval:    QTC Calculation:   R Axis:     Text Interpretation:        Laboratory  Studies: Results for orders placed or performed during the hospital encounter of 06/22/20 (from the past 24 hour(s))  Urinalysis, Routine w reflex microscopic     Status: Abnormal   Collection Time: 06/22/20  1:43 AM  Result Value Ref Range   Color, Urine COLORLESS (A) YELLOW   APPearance CLEAR CLEAR   Specific Gravity, Urine 1.000 (L) 1.005 - 1.030  pH 5.0 5.0 - 8.0   Glucose, UA NEGATIVE NEGATIVE mg/dL   Hgb urine dipstick NEGATIVE NEGATIVE   Bilirubin Urine NEGATIVE NEGATIVE   Ketones, ur NEGATIVE NEGATIVE mg/dL   Protein, ur NEGATIVE NEGATIVE mg/dL   Nitrite NEGATIVE NEGATIVE   Leukocytes,Ua NEGATIVE NEGATIVE  CBC with Differential/Platelet     Status: Abnormal   Collection Time: 06/22/20  1:43 AM  Result Value Ref Range   WBC 11.7 (H) 4.0 - 10.5 K/uL   RBC 3.64 (L) 4.22 - 5.81 MIL/uL   Hemoglobin 11.5 (L) 13.0 - 17.0 g/dL   HCT 32.5 (L) 39.0 - 52.0 %   MCV 89.3 80.0 - 100.0 fL   MCH 31.6 26.0 - 34.0 pg   MCHC 35.4 30.0 - 36.0 g/dL   RDW 13.1 11.5 - 15.5 %   Platelets 209 150 - 400 K/uL   nRBC 0.0 0.0 - 0.2 %   Neutrophils Relative % 76 %   Neutro Abs 9.0 (H) 1.7 - 7.7 K/uL   Lymphocytes Relative 10 %   Lymphs Abs 1.1 0.7 - 4.0 K/uL   Monocytes Relative 11 %   Monocytes Absolute 1.3 (H) 0.1 - 1.0 K/uL   Eosinophils Relative 1 %   Eosinophils Absolute 0.1 0.0 - 0.5 K/uL   Basophils Relative 0 %   Basophils Absolute 0.0 0.0 - 0.1 K/uL   Immature Granulocytes 2 %   Abs Immature Granulocytes 0.18 (H) 0.00 - 0.07 K/uL  Lactic acid, plasma     Status: None   Collection Time: 06/22/20  1:43 AM  Result Value Ref Range   Lactic Acid, Venous 1.4 0.5 - 1.9 mmol/L  C-reactive protein     Status: Abnormal   Collection Time: 06/22/20  1:43 AM  Result Value Ref Range   CRP 19.9 (H) <1.0 mg/dL  Sedimentation rate     Status: Abnormal   Collection Time: 06/22/20  1:43 AM  Result Value Ref Range   Sed Rate 55 (H) 0 - 16 mm/hr  Comprehensive metabolic panel     Status: Abnormal    Collection Time: 06/22/20  4:05 AM  Result Value Ref Range   Sodium 132 (L) 135 - 145 mmol/L   Potassium 3.0 (L) 3.5 - 5.1 mmol/L   Chloride 99 98 - 111 mmol/L   CO2 24 22 - 32 mmol/L   Glucose, Bld 125 (H) 70 - 99 mg/dL   BUN 23 8 - 23 mg/dL   Creatinine, Ser 1.46 (H) 0.61 - 1.24 mg/dL   Calcium 8.5 (L) 8.9 - 10.3 mg/dL   Total Protein 5.9 (L) 6.5 - 8.1 g/dL   Albumin 2.9 (L) 3.5 - 5.0 g/dL   AST 19 15 - 41 U/L   ALT 21 0 - 44 U/L   Alkaline Phosphatase 46 38 - 126 U/L   Total Bilirubin 1.5 (H) 0.3 - 1.2 mg/dL   GFR, Estimated 49 (L) >60 mL/min   Anion gap 9 5 - 15   *Note: Due to a large number of results and/or encounters for the requested time period, some results have not been displayed. A complete set of results can be found in Results Review.   Imaging Studies: CT Angio Chest PE W and/or Wo Contrast  Result Date: 06/22/2020 CLINICAL DATA:  Hypoxia on room air. Signs/symptoms of DVT. History of prostate and bladder cancer. EXAM: CT ANGIOGRAPHY CHEST WITH CONTRAST TECHNIQUE: Multidetector CT imaging of the chest was performed using the standard protocol during bolus administration of intravenous  contrast. Multiplanar CT image reconstructions and MIPs were obtained to evaluate the vascular anatomy. CONTRAST:  169mL OMNIPAQUE IOHEXOL 350 MG/ML SOLN COMPARISON:  Chest radiograph from earlier today. FINDINGS: Cardiovascular: The study is low to moderate quality for the evaluation of pulmonary embolism, with significant motion degradation. There are no convincing filling defects in the central, lobar, segmental or subsegmental pulmonary artery branches to suggest acute pulmonary embolism. Atherosclerotic nonaneurysmal thoracic aorta. Normal caliber pulmonary arteries. Top-normal heart size. No significant pericardial fluid/thickening. Three-vessel coronary atherosclerosis status post CABG. Mediastinum/Nodes: Apparent right hemithyroidectomy with heterogeneous mildly enlarged left thyroid  gland, partially calcified, without discrete thyroid nodules. Unremarkable esophagus. No pathologically enlarged axillary, mediastinal or hilar lymph nodes. Lungs/Pleura: No pneumothorax. No pleural effusion. Platelike moderate atelectasis in the bilateral lower lobes. Otherwise no acute consolidative airspace disease, lung masses or significant pulmonary nodules. Upper abdomen: Cholecystectomy. Musculoskeletal: No aggressive appearing focal osseous lesions. Intact sternotomy wires. Marked thoracic spondylosis. Partially visualized surgical hardware from ACDF. Review of the MIP images confirms the above findings. IMPRESSION: 1. Motion degraded scan.  No evidence of pulmonary embolism. 2. Platelike moderate bilateral lower lobe atelectasis. 3. Apparent right hemithyroidectomy with heterogeneous mildly enlarged left thyroid gland, partially calcified, without discrete thyroid nodules. Recommend thyroid ultrasound (ref: J Am Coll Radiol. 2015 Feb;12(2): 143-50). 4. Aortic Atherosclerosis (ICD10-I70.0). Electronically Signed   By: Ilona Sorrel M.D.   On: 06/22/2020 06:14   DG Chest Port 1 View  Result Date: 06/22/2020 CLINICAL DATA:  Weakness and hypoxia.  [place] out of bed today. EXAM: PORTABLE CHEST 1 VIEW COMPARISON:  06/11/2020 FINDINGS: Postoperative changes in the mediastinum and in the lower cervical spine. Shallow inspiration. Heart size and pulmonary vascularity are likely normal for technique. Probable atelectasis in the lung bases. No focal consolidation. No pleural effusion or pneumothorax. Calcification of the aorta. IMPRESSION: Shallow inspiration with atelectasis in the lung bases. Electronically Signed   By: Lucienne Capers M.D.   On: 06/22/2020 01:54    ED COURSE and MDM  Nursing notes, initial and subsequent vitals signs, including pulse oximetry, reviewed and interpreted by myself.  Vitals:   06/22/20 0415 06/22/20 0445 06/22/20 0515 06/22/20 0606  BP: 112/65 129/70 122/65 (!) 149/73   Pulse: 99 98 97 (!) 101  Resp: 14 16 13 15   Temp:    100.3 F (37.9 C)  TempSrc:    Oral  SpO2: 96% 94% 95% 96%  Weight:      Height:       Medications  sodium chloride 0.9 % bolus 1,000 mL (0 mLs Intravenous Stopped 06/22/20 0247)  iohexol (OMNIPAQUE) 350 MG/ML injection 100 mL (100 mLs Intravenous Contrast Given 06/22/20 0528)  HYDROmorphone (DILAUDID) injection 1 mg (1 mg Intravenous Given 06/22/20 8527)   6:17 AM Discussed with Dr. Lucia Gaskins of orthopedics.  He does not believe the patient's presentation is consistent with a septic knee joint at this time.  He will arrange for the patient to be seen by Dr. Rhona Raider in the office the day after tomorrow (Monday).  Because he is somewhat hypoxic and his CT is showing atelectasis we will give him an incentive spirometer.   PROCEDURES  Procedures   ED DIAGNOSES     ICD-10-CM   1. Postoperative fever  R50.82        Shanon Rosser, MD 06/22/20 [phone]

## 2020-06-22 NOTE — ED Triage Notes (Signed)
Pt came in from home with c/o weakness. Pt had L TKA on Tue. He fell out of bed today, and he could not get back up due to pain in his knee. EMS was called by his wife. On arrival, EMS states that his radial pulses were barely palpable and he had a BP of 70/40. Pt has rectal temp of 100.6 and HR is ST at 106. RA saturations were 86 to 89%. Placed on 2L. Pressure stable on arrival.

## 2020-06-22 NOTE — ED Notes (Signed)
Pt taken off O2 and given IS with teaching. Pt maintaining sats at 93 or above

## 2020-06-22 NOTE — ED Notes (Signed)
Pt taken off O2 for trial on RA. Pt saturations while awake dropped to 86%. Provider notified

## 2020-06-23 ENCOUNTER — Other Ambulatory Visit: Payer: Self-pay | Admitting: Medical

## 2020-06-24 DIAGNOSIS — Z9889 Other specified postprocedural states: Secondary | ICD-10-CM | POA: Diagnosis not present

## 2020-06-24 NOTE — Telephone Encounter (Signed)
FYI, called and spoke with pt and Pt did go to [facility] Saturday, states Dr. Latanya Maudlin will be working him in today between 10-12 and he states he just feels like he isn't doing good at all. I asked if he still wanted to keep his appointment with Dr. Yong Channel on 06/26/20 and he stated to cancel it b/c he does not feel like he will be able to make it. I told pt to give [place] a call if he needed anything else from [place], pt stated ok.

## 2020-06-24 NOTE — Telephone Encounter (Signed)
This is Dr. Hochrein's pt. °

## 2020-06-24 NOTE — Telephone Encounter (Signed)
Hate hes not feeling well- we are happy to see him if needed

## 2020-06-25 DIAGNOSIS — Z96659 Presence of unspecified artificial knee joint: Secondary | ICD-10-CM | POA: Diagnosis not present

## 2020-06-25 DIAGNOSIS — R339 Retention of urine, unspecified: Secondary | ICD-10-CM | POA: Diagnosis not present

## 2020-06-26 ENCOUNTER — Telehealth: Payer: Self-pay

## 2020-06-26 ENCOUNTER — Ambulatory Visit: Payer: PPO | Admitting: Family Medicine

## 2020-06-26 DIAGNOSIS — R739 Hyperglycemia, unspecified: Secondary | ICD-10-CM

## 2020-06-26 DIAGNOSIS — F324 Major depressive disorder, single episode, in partial remission: Secondary | ICD-10-CM

## 2020-06-26 DIAGNOSIS — Z96652 Presence of left artificial knee joint: Secondary | ICD-10-CM | POA: Diagnosis not present

## 2020-06-26 DIAGNOSIS — E785 Hyperlipidemia, unspecified: Secondary | ICD-10-CM

## 2020-06-26 DIAGNOSIS — K219 Gastro-esophageal reflux disease without esophagitis: Secondary | ICD-10-CM

## 2020-06-26 DIAGNOSIS — M10071 Idiopathic gout, right ankle and foot: Secondary | ICD-10-CM

## 2020-06-26 DIAGNOSIS — Z9889 Other specified postprocedural states: Secondary | ICD-10-CM | POA: Diagnosis not present

## 2020-06-26 DIAGNOSIS — M25562 Pain in left knee: Secondary | ICD-10-CM | POA: Diagnosis not present

## 2020-06-26 DIAGNOSIS — N183 Chronic kidney disease, stage 3 unspecified: Secondary | ICD-10-CM

## 2020-06-26 DIAGNOSIS — J4540 Moderate persistent asthma, uncomplicated: Secondary | ICD-10-CM

## 2020-06-26 DIAGNOSIS — I10 Essential (primary) hypertension: Secondary | ICD-10-CM

## 2020-06-26 NOTE — Progress Notes (Addendum)
Chronic Care Management Pharmacy Assistant   Name: Joshua [name]  MRN: [id number] DOB: [birth year]  Reason for Encounter: Patient Assistance Application- [employer]  Medications: Outpatient Encounter Medications as of 06/26/2020  Medication Sig Note   pantoprazole (PROTONIX) 40 MG tablet TAKE 1 TABLET BY MOUTH EVERY DAY    acetaminophen (TYLENOL) 500 MG tablet Take 1,000 mg by mouth every 6 (six) hours as needed for moderate pain or headache.    albuterol (PROAIR HFA) 108 (90 BASE) MCG/ACT inhaler Inhale 1-2 puffs into the lungs every 6 (six) hours as needed for wheezing or shortness of breath.    amLODipine (NORVASC) 5 MG tablet TAKE 1 TABLET BY MOUTH EVERY DAY (Patient taking differently: Take 5 mg by mouth daily.)    aspirin EC 81 MG tablet Take 1 tablet (81 mg total) by mouth 2 (two) times daily after a meal. 06/22/2020: Had 3 tablets on 03/31 so did not have on 04/01   cholecalciferol (VITAMIN D3) 25 MCG (1000 UNIT) tablet Take 1,000 Units by mouth daily.    clotrimazole-betamethasone (LOTRISONE) cream Apply 1 application topically See admin instructions. Twice daily every 4 days    Cyanocobalamin (B-12) 2500 MCG TABS Take 1,250 mcg by mouth daily.    diazepam (VALIUM) 5 MG tablet TAKE 1 TABLET (5 MG TOTAL) BY MOUTH AT BEDTIME AS NEEDED. (Patient taking differently: Take 5 mg by mouth at bedtime as needed (sleep).)    EPINEPHrine 0.3 mg/0.3 mL IJ SOAJ injection Inject 0.3 mg into the muscle as needed for anaphylaxis. for allergic reaction    ezetimibe (ZETIA) 10 MG tablet Take 1 tablet (10 mg total) by mouth daily.    FLOVENT HFA 110 MCG/ACT inhaler Inhale 1 puff into the lungs 2 (two) times daily.     fluticasone (FLONASE) 50 MCG/ACT nasal spray Place 1 spray into both nostrils 2 (two) times daily.     folic acid (FOLVITE) 948 MCG tablet Take 800 mcg by mouth daily.     gabapentin (NEURONTIN) 300 MG capsule Take 1 cap in AM, 1 cap at noon, 2 caps at bedtime (Patient taking differently:  Take 300-600 mg by mouth See admin instructions. Take 300 mg in the morning, 300 mg at noon, and 600 mg at bedtime)    Glucos-Chond-Hyal Ac-Ca Fructo (MOVE FREE JOINT HEALTH ADVANCE PO) Take 1 tablet by mouth daily.    hydrochlorothiazide (HYDRODIURIL) 25 MG tablet TAKE 1 TABLET BY MOUTH EVERY DAY (Patient taking differently: Take 25 mg by mouth daily.)    ketoconazole (NIZORAL) 2 % cream Apply 1 application topically See admin instructions. Twice a day every other day    loratadine (CLARITIN) 10 MG tablet Take 10 mg by mouth at bedtime.     losartan (COZAAR) 100 MG tablet TAKE 1 TABLET BY MOUTH EVERY DAY (Patient taking differently: Take 100 mg by mouth daily.)    Miconazole Nitrate (LOTRIMIN AF DEODORANT POWDER) 2 % AERP Apply 1 spray topically daily as needed (jock itch).    nitroGLYCERIN (NITROSTAT) 0.4 MG SL tablet PLACE 1 TABLET UNDER THE TONGUE EVERY 5 (FIVE) MINUTES AS NEEDED. (Patient taking differently: Place 0.4 mg under the tongue every 5 (five) minutes as needed for chest pain.)    nystatin-triamcinolone ointment (MYCOLOG) Apply 1 application topically See admin instructions. Twice daily every 4 days    polyethylene glycol powder (GLYCOLAX/MIRALAX) powder Take 17 g by mouth daily. (Patient taking differently: Take 17 g by mouth in the morning and at bedtime.)  simvastatin (ZOCOR) 20 MG tablet TAKE 1 TABLET BY MOUTH EVERYDAY AT BEDTIME (Patient taking differently: Take 20 mg by mouth at bedtime.)    tiZANidine (ZANAFLEX) 4 MG tablet Take 1 tablet (4 mg total) by mouth every 6 (six) hours as needed for muscle spasms.    traMADol (ULTRAM) 50 MG tablet Take 1 tablet (50 mg total) by mouth every 6 (six) hours as needed. (Patient taking differently: Take 50 mg by mouth every 6 (six) hours as needed for moderate pain.)    Wheat Dextrin (BENEFIBER) POWD Take 1 Dose by mouth daily. 1 dose = 2 teaspoons    No facility-administered encounter medications on file as of 06/26/2020.   Prepared and  reviewed patient assistance application for [employer].  Called and spoke with patient. He is not doing well this morning and is relieved that the application is being sent out. He is aware that the application for his Marion Downer must be completed and returned to Dr. Lysbeth Penner office when as soon as he is able to.    [employer] [employer] CPA, CMA

## 2020-06-27 LAB — CULTURE, BLOOD (ROUTINE X 2)
Culture: NO GROWTH
Culture: NO GROWTH

## 2020-06-28 DIAGNOSIS — Z9889 Other specified postprocedural states: Secondary | ICD-10-CM | POA: Diagnosis not present

## 2020-06-28 DIAGNOSIS — Z96652 Presence of left artificial knee joint: Secondary | ICD-10-CM | POA: Diagnosis not present

## 2020-06-28 DIAGNOSIS — M1712 Unilateral primary osteoarthritis, left knee: Secondary | ICD-10-CM | POA: Diagnosis not present

## 2020-07-02 DIAGNOSIS — Z96652 Presence of left artificial knee joint: Secondary | ICD-10-CM | POA: Diagnosis not present

## 2020-07-02 DIAGNOSIS — M1712 Unilateral primary osteoarthritis, left knee: Secondary | ICD-10-CM | POA: Diagnosis not present

## 2020-07-03 DIAGNOSIS — G4733 Obstructive sleep apnea (adult) (pediatric): Secondary | ICD-10-CM | POA: Diagnosis not present

## 2020-07-04 DIAGNOSIS — Z96652 Presence of left artificial knee joint: Secondary | ICD-10-CM | POA: Diagnosis not present

## 2020-07-04 DIAGNOSIS — M1712 Unilateral primary osteoarthritis, left knee: Secondary | ICD-10-CM | POA: Diagnosis not present

## 2020-07-08 DIAGNOSIS — M1712 Unilateral primary osteoarthritis, left knee: Secondary | ICD-10-CM | POA: Diagnosis not present

## 2020-07-08 DIAGNOSIS — Z96652 Presence of left artificial knee joint: Secondary | ICD-10-CM | POA: Diagnosis not present

## 2020-07-11 DIAGNOSIS — Z96652 Presence of left artificial knee joint: Secondary | ICD-10-CM | POA: Diagnosis not present

## 2020-07-11 DIAGNOSIS — M1712 Unilateral primary osteoarthritis, left knee: Secondary | ICD-10-CM | POA: Diagnosis not present

## 2020-07-12 DIAGNOSIS — J3089 Other allergic rhinitis: Secondary | ICD-10-CM | POA: Diagnosis not present

## 2020-07-12 DIAGNOSIS — J301 Allergic rhinitis due to pollen: Secondary | ICD-10-CM | POA: Diagnosis not present

## 2020-07-12 DIAGNOSIS — J3081 Allergic rhinitis due to animal (cat) (dog) hair and dander: Secondary | ICD-10-CM | POA: Diagnosis not present

## 2020-07-14 ENCOUNTER — Other Ambulatory Visit: Payer: Self-pay | Admitting: Medical

## 2020-07-14 ENCOUNTER — Other Ambulatory Visit: Payer: Self-pay | Admitting: Family Medicine

## 2020-07-17 DIAGNOSIS — M1712 Unilateral primary osteoarthritis, left knee: Secondary | ICD-10-CM | POA: Diagnosis not present

## 2020-07-17 DIAGNOSIS — M25561 Pain in right knee: Secondary | ICD-10-CM | POA: Diagnosis not present

## 2020-07-17 DIAGNOSIS — Z96652 Presence of left artificial knee joint: Secondary | ICD-10-CM | POA: Diagnosis not present

## 2020-07-17 DIAGNOSIS — M25562 Pain in left knee: Secondary | ICD-10-CM | POA: Diagnosis not present

## 2020-07-17 DIAGNOSIS — Z471 Aftercare following joint replacement surgery: Secondary | ICD-10-CM | POA: Diagnosis not present

## 2020-07-19 DIAGNOSIS — M1712 Unilateral primary osteoarthritis, left knee: Secondary | ICD-10-CM | POA: Diagnosis not present

## 2020-07-19 DIAGNOSIS — Z96652 Presence of left artificial knee joint: Secondary | ICD-10-CM | POA: Diagnosis not present

## 2020-07-22 DIAGNOSIS — Z9889 Other specified postprocedural states: Secondary | ICD-10-CM | POA: Diagnosis not present

## 2020-07-22 DIAGNOSIS — M47816 Spondylosis without myelopathy or radiculopathy, lumbar region: Secondary | ICD-10-CM | POA: Diagnosis not present

## 2020-07-22 DIAGNOSIS — M1712 Unilateral primary osteoarthritis, left knee: Secondary | ICD-10-CM | POA: Diagnosis not present

## 2020-07-22 DIAGNOSIS — Z96652 Presence of left artificial knee joint: Secondary | ICD-10-CM | POA: Diagnosis not present

## 2020-07-24 ENCOUNTER — Ambulatory Visit: Payer: PPO

## 2020-07-26 ENCOUNTER — Telehealth: Payer: Self-pay

## 2020-07-26 DIAGNOSIS — E049 Nontoxic goiter, unspecified: Secondary | ICD-10-CM

## 2020-07-26 NOTE — Telephone Encounter (Signed)
Pt called stating he received a letter regarding an incidental finding on his lungs (see letter from 07/22/20). Pt states he did not have any scans preformed on this date and is very confused because it did not have your name or anything on it and he has not heard from you so he is not sure if this is legit and would like to hear from you via mychart regarding this.

## 2020-07-26 NOTE — Telephone Encounter (Signed)
Appears legitimate.  From imaging in April "3. Apparent right hemithyroidectomy with heterogeneous mildly enlarged left thyroid gland, partially calcified, without discrete thyroid nodules. Recommend thyroid ultrasound (ref: J Am Coll Radiol. 2015 Feb;12(2): 143-50)."  Team please order thyroid ultrasound under enlarged thyroid to further evaluate.  I do not anticipate a major issue but reasonable to follow through with this

## 2020-07-26 NOTE — Telephone Encounter (Signed)
Called and spoke with pt and pt wife and gave below message and they agree to thyroid ultrasound. Thyroid ultrasound has been ordered.

## 2020-07-29 ENCOUNTER — Other Ambulatory Visit (HOSPITAL_BASED_OUTPATIENT_CLINIC_OR_DEPARTMENT_OTHER): Payer: Self-pay

## 2020-07-29 ENCOUNTER — Ambulatory Visit (INDEPENDENT_AMBULATORY_CARE_PROVIDER_SITE_OTHER): Payer: PPO | Admitting: Family Medicine

## 2020-07-29 ENCOUNTER — Encounter: Payer: Self-pay | Admitting: Family Medicine

## 2020-07-29 ENCOUNTER — Ambulatory Visit: Payer: PPO | Attending: Internal Medicine

## 2020-07-29 ENCOUNTER — Other Ambulatory Visit: Payer: Self-pay

## 2020-07-29 VITALS — BP 144/84 | HR 89 | Temp 97.9°F | Ht 66.0 in | Wt 197.6 lb

## 2020-07-29 DIAGNOSIS — N183 Chronic kidney disease, stage 3 unspecified: Secondary | ICD-10-CM | POA: Diagnosis not present

## 2020-07-29 DIAGNOSIS — R6889 Other general symptoms and signs: Secondary | ICD-10-CM | POA: Diagnosis not present

## 2020-07-29 DIAGNOSIS — I10 Essential (primary) hypertension: Secondary | ICD-10-CM | POA: Diagnosis not present

## 2020-07-29 DIAGNOSIS — E010 Iodine-deficiency related diffuse (endemic) goiter: Secondary | ICD-10-CM

## 2020-07-29 DIAGNOSIS — Z23 Encounter for immunization: Secondary | ICD-10-CM

## 2020-07-29 MED ORDER — PFIZER-BIONT COVID-19 VAC-TRIS 30 MCG/0.3ML IM SUSP
INTRAMUSCULAR | 0 refills | Status: DC
  Filled 2020-07-29: qty 0.3, 1d supply, fill #0

## 2020-07-29 NOTE — Progress Notes (Signed)
   Covid-19 Vaccination Clinic  Name:  Joshua [name]    MRN: [id number] DOB: 08/09/1940  07/29/2020  [name] was observed post Covid-19 immunization for 15 minutes without incident. He was provided with Vaccine Information Sheet and instruction to access the V-Safe system.   [name] was instructed to call 911 with any severe reactions post vaccine: [name] Difficulty breathing  . Swelling of face and throat  . A fast heartbeat  . A bad rash all over body  . Dizziness and weakness   Immunizations Administered    Name Date Dose VIS Date Route   PFIZER Comrnaty(Gray TOP) Covid-19 Vaccine 07/29/2020 11:39 AM 0.3 mL 02/29/2020 Intramuscular   Manufacturer: [place]   Lot: [id number]   NDC: [phone]

## 2020-07-29 NOTE — Patient Instructions (Addendum)
Goal home blood pressure <135/85- keep an eye on this and if not trending down in coming weeks we may need to make adjustment such as increasing amlodipine to 7.5 mg  Schedule bloodwork for someday this week.   Call [facility] and see if they can work you in sooner for ultrasound  Recommended follow up: Return in about 3 months (around 10/29/2020) for follow up- or sooner if needed.

## 2020-07-29 NOTE — Progress Notes (Signed)
Phone [phone] In person visit   Subjective:   Joshua [name] is a 80 y.o. year old very pleasant male patient who presents for/with See problem oriented charting Chief Complaint  Patient presents with  . Thyroid Problem    Patient states that his thyroid is enlarged per your comments.   . Knee Pain    Left  Patient states that he had a knee replacements 5 weeks and 6 days ago .   [name] Body Temp    Patient states that he's always cold.     This visit occurred during the SARS-CoV-2 public health emergency.  Safety protocols were in place, including screening questions prior to the visit, additional usage of staff PPE, and extensive cleaning of exam room while observing appropriate contact time as indicated for disinfecting solutions.   Past Medical History-  Patient Active Problem List   Diagnosis Date Noted  . Coronary atherosclerosis 10/12/2006    Priority: High  . Irritable bowel syndrome 10/12/2006    Priority: High  . Overactive bladder 11/24/2019    Priority: Medium  . Hyperglycemia 10/07/2017    Priority: Medium  . Gout 11/20/2016    Priority: Medium  . CKD (chronic kidney disease), stage III ([place]) 10/23/2013    Priority: Medium  . Neuropathy ([place]) 08/21/2013    Priority: Medium  . History of bladder cancer 04/01/2010    Priority: Medium  . Essential hypertension 02/21/2007    Priority: Medium  . Hyperlipidemia 10/12/2006    Priority: Medium  . Depression, major, single episode, in partial remission ([place]) 10/12/2006    Priority: Medium  . Asthma 10/12/2006    Priority: Medium  . Aortic atherosclerosis ([place]) 05/05/2018    Priority: Low  . DJD (degenerative joint disease)     Priority: Low  . Arthritis     Priority: Low  . Neck pain 02/08/2017    Priority: Low  . Venous insufficiency 12/29/2016    Priority: Low  . Ganglion cyst of tendon sheath of right hand 12/29/2016    Priority: Low  . Avulsion fracture of metatarsal bone of right foot 11/25/2016     Priority: Low  . Obesity, Class I, BMI 30-34.9 10/03/2015    Priority: Low  . Allergic rhinitis 05/31/2014    Priority: Low  . Fatigue 11/09/2013    Priority: Low  . Dizziness 08/23/2013    Priority: Low  . RLS (restless legs syndrome) 01/03/2013    Priority: Low  . Chronic bilateral low back pain with left-sided sciatica 10/09/2011    Priority: Low  . OSTEOARTHRITIS, LOWER LEG, LEFT 10/04/2009    Priority: Low  . ANEMIA, IRON DEFICIENCY 11/06/2008    Priority: Low  . 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 OSA (obstructive sleep apnea) 11/02/2006    Priority: Low  . GERD 10/12/2006    Priority: Low  . Primary osteoarthritis of left knee 06/18/2020  . Pre-operative respiratory examination 06/05/2020  . Coronary artery disease involving coronary bypass graft of native heart without angina pectoris 04/07/2020  . Trigger thumb, left thumb 01/24/2020  . Left lateral epicondylitis 09/05/2019  . Degenerative arthritis of left knee 08/08/2019  . Educated about COVID-19 virus infection 04/20/2019  . Unstable angina ([place])   . Greater trochanteric bursitis of left hip 10/27/2018  . Degenerative disc disease, lumbar 10/27/2018  . Abdominal muscle strain, initial encounter 06/16/2018  . Weakness of left leg 06/21/2017  . Hereditary and idiopathic peripheral neuropathy 09/25/2014  Medications- reviewed and updated Current Outpatient Medications  Medication Sig Dispense Refill  . acetaminophen (TYLENOL) 500 MG tablet Take 1,000 mg by mouth every 6 (six) hours as needed for moderate pain or headache.    . albuterol (PROAIR HFA) 108 (90 BASE) MCG/ACT inhaler Inhale 1-2 puffs into the lungs every 6 (six) hours as needed for wheezing or shortness of breath. 18 g 3  . amLODipine (NORVASC) 5 MG tablet Take 1 tablet (5 mg total) by mouth daily. 90 tablet 3  . aspirin EC 81 MG tablet Take 1 tablet (81 mg total) by mouth 2 (two) times daily after a meal. 30 tablet 11   . cholecalciferol (VITAMIN D3) 25 MCG (1000 UNIT) tablet Take 1,000 Units by mouth daily.    . clotrimazole-betamethasone (LOTRISONE) cream Apply 1 application topically See admin instructions. Twice daily every 4 days    . COVID-19 mRNA Vac-TriS, Pfizer, (PFIZER-BIONT COVID-19 VAC-TRIS) SUSP injection Inject into the muscle. 0.3 mL 0  . Cyanocobalamin (B-12) 2500 MCG TABS Take 1,250 mcg by mouth daily.    . diazepam (VALIUM) 5 MG tablet TAKE 1 TABLET (5 MG TOTAL) BY MOUTH AT BEDTIME AS NEEDED. (Patient taking differently: Take 5 mg by mouth at bedtime as needed (sleep).) 30 tablet 1  . ezetimibe (ZETIA) 10 MG tablet Take 1 tablet (10 mg total) by mouth daily. 90 tablet 3  . FLOVENT HFA 110 MCG/ACT inhaler Inhale 1 puff into the lungs 2 (two) times daily.   5  . fluticasone (FLONASE) 50 MCG/ACT nasal spray Place 1 spray into both nostrils 2 (two) times daily.     . folic acid (FOLVITE) 403 MCG tablet Take 800 mcg by mouth daily.     [name] gabapentin (NEURONTIN) 300 MG capsule Take 1 cap in AM, 1 cap at noon, 2 caps at bedtime (Patient taking differently: Take 300-600 mg by mouth See admin instructions. Take 300 mg in the morning, 300 mg at noon, and 600 mg at bedtime) 360 capsule 3  . Glucos-Chond-Hyal Ac-Ca Fructo (MOVE FREE JOINT HEALTH ADVANCE PO) Take 1 tablet by mouth daily.    . hydrochlorothiazide (HYDRODIURIL) 25 MG tablet TAKE 1 TABLET BY MOUTH EVERY DAY (Patient taking differently: Take 25 mg by mouth daily.) 90 tablet 2  . HYDROmorphone (DILAUDID) 2 MG tablet TAKE 1 TABLETS EVERY 6-8 HOURS AS NEEDED FOR SEVERE POST OP PAIN    . ketoconazole (NIZORAL) 2 % cream Apply 1 application topically See admin instructions. Twice a day every other day    . loratadine (CLARITIN) 10 MG tablet Take 10 mg by mouth at bedtime.     [name] losartan (COZAAR) 100 MG tablet TAKE 1 TABLET BY MOUTH EVERY DAY 90 tablet 3  . Miconazole Nitrate (LOTRIMIN AF DEODORANT POWDER) 2 % AERP Apply 1 spray topically daily as  needed (jock itch).    . nitroGLYCERIN (NITROSTAT) 0.4 MG SL tablet PLACE 1 TABLET UNDER THE TONGUE EVERY 5 (FIVE) MINUTES AS NEEDED. (Patient taking differently: Place 0.4 mg under the tongue every 5 (five) minutes as needed for chest pain.) 75 tablet 1  . nystatin-triamcinolone ointment (MYCOLOG) Apply 1 application topically See admin instructions. Twice daily every 4 days    . pantoprazole (PROTONIX) 40 MG tablet TAKE 1 TABLET BY MOUTH EVERY DAY 90 tablet 3  . polyethylene glycol powder (GLYCOLAX/MIRALAX) powder Take 17 g by mouth daily. (Patient taking differently: Take 17 g by mouth in the morning and at bedtime.) 255 g 0  . simvastatin (  ZOCOR) 20 MG tablet TAKE 1 TABLET BY MOUTH EVERYDAY AT BEDTIME (Patient taking differently: Take 20 mg by mouth at bedtime.) 90 tablet 3  . tiZANidine (ZANAFLEX) 4 MG tablet Take 1 tablet (4 mg total) by mouth every 6 (six) hours as needed for muscle spasms. 40 tablet 1  . Wheat Dextrin (BENEFIBER) POWD Take 1 Dose by mouth daily. 1 dose = 2 teaspoons    . EPINEPHrine 0.3 mg/0.3 mL IJ SOAJ injection Inject 0.3 mg into the muscle as needed for anaphylaxis. for allergic reaction (Patient not taking: Reported on 07/29/2020)     No current facility-administered medications for this visit.     Objective:  BP (!) 144/84   Pulse 89   Temp 97.9 F (36.6 C) (Temporal)   Ht 5\' 6"  (1.676 m)   Wt 197 lb 9.6 oz (89.6 kg)   SpO2 97%   BMI 31.89 kg/m  Gen: NAD, resting comfortably CV: RRR no murmurs rubs or gallops Lungs: CTAB no crackles, wheeze, rhonchi Abdomen: soft/nontender/nondistended/normal bowel sounds. No rebound or guarding.  Ext: trace to 1+ edema on left leg Skin: warm, dry Neuro: grossly normal, moves all extremities     Assessment and Plan    #Thyromegaly #Cold intolerance S: Patient reports feeling rather cold recently.  He has lost 16 pounds in recent months after his surgery to his knee- reports increased nausea more than usual after left  knee surgery  Patient had postoperative fever and went back to the emergency room about a month ago- -no septic joint was suspected.  CT angiogram without other clear cause of fever.  Patient was found to have atelectasis  And incidental finding of mildly enlarged left-sided thyroid gland (hemithyroidectomy on the right) was noted-patient received a letter in recent days about this incidental finding- patient called our office and we ordered a thyroid ultrasound.  Because of prior thyroid resection patient concerned about potential thyroid cancer and scheduled visit particularly with cold intolerance A/P: In regards to cold intolerance I wonder if this could be related to his recent weight loss.  We will still check CBC as well as thyroid levels.  Will come back for these labs-oncology notes that by the time I saw patient our lab technicians were no longer in the building. - In regards to weight loss I doubt hyperthyroidism-more strongly suspect related to nausea postsurgery.  Patient agrees to follow through with ultrasound and labs   #Hypertension/CKD stage III S: Compliant with amlodipine 5 mg, hydrochlorothiazide 25 mg and losartan 100 mg -GFR has been stable.  On ARB in case proteinuric element BP Readings from Last 3 Encounters:  07/29/20 (!) 144/84  06/22/20 (!) 104/58  06/18/20 135/65  A/P: CKD stage III appears largely stable labs in April-we will check again with labs today.  In regards to blood pressure- mild poor control but patient also had low blood pressure when he went to the emergency room blood pressure was controlled around the time of surgery-we opted not to make any changes at this time and do some home monitoring with i goal less than 135/85.  I wonder about changing to chlorthalidone from hydrochlorothiazide or to valsartan from losartan neck potential neck steps versus increasing amlodipine- a little hesitant about increasing amlodipine due to some edema in left leg  postsurgery -Patient does have increased pain in his knee and has pain even at rest so I wonder if that is contributing to increased blood pressure  Recommended follow up: Return in about 3  months (around 10/29/2020) for follow up- or sooner if needed. Future Appointments  Date Time Provider [place]  09/11/2020 11:00 AM [facility] CCM PHARMACIST [facility]  01/08/2021  8:30 AM Cameron Sprang, MD [facility] None  05/15/2021  9:30 AM [facility] HEALTH COACH [facility]    Lab/Order associations:   ICD-10-CM   1. Thyromegaly  E01.0 TSH    T4, free    T3, free  2. Essential hypertension  I10 CBC with Differential/Platelet    Comprehensive metabolic panel  3. Stage 3 chronic kidney disease, unspecified whether stage 3a or 3b CKD (HCC)  N18.30   4. Cold intolerance  R68.89    Return precautions advised.  [name], MD

## 2020-07-30 ENCOUNTER — Other Ambulatory Visit (INDEPENDENT_AMBULATORY_CARE_PROVIDER_SITE_OTHER): Payer: PPO

## 2020-07-30 DIAGNOSIS — I10 Essential (primary) hypertension: Secondary | ICD-10-CM

## 2020-07-30 DIAGNOSIS — E010 Iodine-deficiency related diffuse (endemic) goiter: Secondary | ICD-10-CM | POA: Diagnosis not present

## 2020-07-30 LAB — CBC WITH DIFFERENTIAL/PLATELET
Basophils Absolute: 0.1 10*3/uL (ref 0.0–0.1)
Basophils Relative: 0.7 % (ref 0.0–3.0)
Eosinophils Absolute: 0.3 10*3/uL (ref 0.0–0.7)
Eosinophils Relative: 2.8 % (ref 0.0–5.0)
HCT: 38 % — ABNORMAL LOW (ref 39.0–52.0)
Hemoglobin: 13.1 g/dL (ref 13.0–17.0)
Lymphocytes Relative: 9 % — ABNORMAL LOW (ref 12.0–46.0)
Lymphs Abs: 0.8 10*3/uL (ref 0.7–4.0)
MCHC: 34.4 g/dL (ref 30.0–36.0)
MCV: 89.4 fl (ref 78.0–100.0)
Monocytes Absolute: 0.8 10*3/uL (ref 0.1–1.0)
Monocytes Relative: 9 % (ref 3.0–12.0)
Neutro Abs: 7.4 10*3/uL (ref 1.4–7.7)
Neutrophils Relative %: 78.5 % — ABNORMAL HIGH (ref 43.0–77.0)
Platelets: 248 10*3/uL (ref 150.0–400.0)
RBC: 4.25 Mil/uL (ref 4.22–5.81)
RDW: 13.8 % (ref 11.5–15.5)
WBC: 9.4 10*3/uL (ref 4.0–10.5)

## 2020-07-30 LAB — COMPREHENSIVE METABOLIC PANEL
ALT: 17 U/L (ref 0–53)
AST: 13 U/L (ref 0–37)
Albumin: 4.1 g/dL (ref 3.5–5.2)
Alkaline Phosphatase: 67 U/L (ref 39–117)
BUN: 23 mg/dL (ref 6–23)
CO2: 29 mEq/L (ref 19–32)
Calcium: 9.9 mg/dL (ref 8.4–10.5)
Chloride: 101 mEq/L (ref 96–112)
Creatinine, Ser: 1.32 mg/dL (ref 0.40–1.50)
GFR: 52.05 mL/min — ABNORMAL LOW (ref >60.00)
Glucose, Bld: 114 mg/dL — ABNORMAL HIGH (ref 70–99)
Potassium: 4 mEq/L (ref 3.5–5.1)
Sodium: 137 mEq/L (ref 135–145)
Total Bilirubin: 0.8 mg/dL (ref 0.2–1.2)
Total Protein: 6.5 g/dL (ref 6.0–8.3)

## 2020-07-30 LAB — T3, FREE: T3, Free: 2.5 pg/mL (ref 2.3–4.2)

## 2020-07-30 LAB — TSH: TSH: 0.6 u[IU]/mL (ref 0.35–4.50)

## 2020-07-30 LAB — T4, FREE: Free T4: 0.94 ng/dL (ref 0.60–1.60)

## 2020-08-05 DIAGNOSIS — J3089 Other allergic rhinitis: Secondary | ICD-10-CM | POA: Diagnosis not present

## 2020-08-05 DIAGNOSIS — J301 Allergic rhinitis due to pollen: Secondary | ICD-10-CM | POA: Diagnosis not present

## 2020-08-05 DIAGNOSIS — M25562 Pain in left knee: Secondary | ICD-10-CM | POA: Diagnosis not present

## 2020-08-05 DIAGNOSIS — J3081 Allergic rhinitis due to animal (cat) (dog) hair and dander: Secondary | ICD-10-CM | POA: Diagnosis not present

## 2020-08-06 ENCOUNTER — Other Ambulatory Visit: Payer: PPO

## 2020-08-07 ENCOUNTER — Encounter: Payer: Self-pay | Admitting: Family Medicine

## 2020-08-07 DIAGNOSIS — G4733 Obstructive sleep apnea (adult) (pediatric): Secondary | ICD-10-CM | POA: Diagnosis not present

## 2020-08-08 ENCOUNTER — Other Ambulatory Visit: Payer: Self-pay

## 2020-08-08 MED ORDER — VALSARTAN 320 MG PO TABS: 320.0000 mg | ORAL_TABLET | Freq: Every day | ORAL | 3 refills | Status: DC

## 2020-08-08 NOTE — Telephone Encounter (Signed)
Patient called in stating that he wants to know before picking up Valsartan that he wont have a reaction, as he has a hard time taking statin medications.

## 2020-08-10 ENCOUNTER — Emergency Department (HOSPITAL_COMMUNITY)
Admission: EM | Admit: 2020-08-10 | Discharge: 2020-08-11 | Disposition: A | Discharge status: Home / Self Care | Payer: PPO | Attending: Emergency Medicine | Admitting: Emergency Medicine

## 2020-08-10 ENCOUNTER — Other Ambulatory Visit: Payer: Self-pay

## 2020-08-10 ENCOUNTER — Encounter (HOSPITAL_COMMUNITY): Payer: Self-pay

## 2020-08-10 ENCOUNTER — Emergency Department (HOSPITAL_COMMUNITY): Payer: PPO

## 2020-08-10 DIAGNOSIS — J45909 Unspecified asthma, uncomplicated: Secondary | ICD-10-CM | POA: Diagnosis not present

## 2020-08-10 DIAGNOSIS — Z7982 Long term (current) use of aspirin: Secondary | ICD-10-CM | POA: Diagnosis not present

## 2020-08-10 DIAGNOSIS — Z951 Presence of aortocoronary bypass graft: Secondary | ICD-10-CM | POA: Insufficient documentation

## 2020-08-10 DIAGNOSIS — M25462 Effusion, left knee: Secondary | ICD-10-CM | POA: Diagnosis not present

## 2020-08-10 DIAGNOSIS — Z8551 Personal history of malignant neoplasm of bladder: Secondary | ICD-10-CM | POA: Diagnosis not present

## 2020-08-10 DIAGNOSIS — S8992XA Unspecified injury of left lower leg, initial encounter: Secondary | ICD-10-CM | POA: Diagnosis present

## 2020-08-10 DIAGNOSIS — Z20822 Contact with and (suspected) exposure to covid-19: Secondary | ICD-10-CM | POA: Diagnosis not present

## 2020-08-10 DIAGNOSIS — R69 Illness, unspecified: Secondary | ICD-10-CM | POA: Diagnosis not present

## 2020-08-10 DIAGNOSIS — W06XXXA Fall from bed, initial encounter: Secondary | ICD-10-CM | POA: Insufficient documentation

## 2020-08-10 DIAGNOSIS — Z87891 Personal history of nicotine dependence: Secondary | ICD-10-CM | POA: Diagnosis not present

## 2020-08-10 DIAGNOSIS — I1310 Hypertensive heart and chronic kidney disease without heart failure, with stage 1 through stage 4 chronic kidney disease, or unspecified chronic kidney disease: Secondary | ICD-10-CM | POA: Diagnosis not present

## 2020-08-10 DIAGNOSIS — M2548 Effusion, other site: Secondary | ICD-10-CM | POA: Diagnosis not present

## 2020-08-10 DIAGNOSIS — Z8546 Personal history of malignant neoplasm of prostate: Secondary | ICD-10-CM | POA: Insufficient documentation

## 2020-08-10 DIAGNOSIS — S81812A Laceration without foreign body, left lower leg, initial encounter: Secondary | ICD-10-CM | POA: Diagnosis not present

## 2020-08-10 DIAGNOSIS — N183 Chronic kidney disease, stage 3 unspecified: Secondary | ICD-10-CM | POA: Insufficient documentation

## 2020-08-10 DIAGNOSIS — Z79899 Other long term (current) drug therapy: Secondary | ICD-10-CM | POA: Diagnosis not present

## 2020-08-10 DIAGNOSIS — Z96652 Presence of left artificial knee joint: Secondary | ICD-10-CM | POA: Insufficient documentation

## 2020-08-10 DIAGNOSIS — Y92009 Unspecified place in unspecified non-institutional (private) residence as the place of occurrence of the external cause: Secondary | ICD-10-CM | POA: Diagnosis not present

## 2020-08-10 DIAGNOSIS — T889XXA Complication of surgical and medical care, unspecified, initial encounter: Secondary | ICD-10-CM | POA: Insufficient documentation

## 2020-08-10 DIAGNOSIS — I2510 Atherosclerotic heart disease of native coronary artery without angina pectoris: Secondary | ICD-10-CM | POA: Diagnosis not present

## 2020-08-10 MED ORDER — LIDOCAINE HCL (PF) 1 % IJ SOLN
5.0000 mL | Freq: Once | INTRAMUSCULAR | Status: AC
  Administered 2020-08-11: 5 mL
  Filled 2020-08-10: qty 30

## 2020-08-10 MED ORDER — VANCOMYCIN HCL 500 MG IV SOLR
500.0000 mg | INTRAVENOUS | Status: AC
  Administered 2020-08-11: 500 mg
  Filled 2020-08-10: qty 500

## 2020-08-10 NOTE — ED Provider Notes (Signed)
Joshua [name]   CSN: [id number] Arrival date & time: 08/10/20  2227     History Chief Complaint  Patient presents with  . Post-op Problem    Post op -s/p knee replacement, wound now open.     Joshua [name] is a 80 y.o. male.  Patient presents with chief complaint left knee pain.  He states he was walking at home when he lost his balance and fell onto his left knee.  Denies head injury or neck or back pain.  Noticed bleeding from his left knee.  Patient states he had a left total knee repair done about 2 months ago and had been doing well until the fall today.  Otherwise denies fevers cough vomiting or diarrhea.        Past Medical History:  Diagnosis Date  . Acute medial meniscal tear   . Anemia 2015  . Arthritis    "middle finger right hand; right knee; neck" (02/06/2014)  . Asthma    seasonal  . Bladder cancer ([place]) 04/2010   "cauterized during prostate OR"  . CAD (coronary artery disease)    CABG 2001  . Diverticulosis   . DJD (degenerative joint disease)    BACK  . GERD (gastroesophageal reflux disease)   . H/O inguinal hernia repair 12/2018  . History of gout   . Hyperlipidemia   . Hypertension   . IBS (irritable bowel syndrome)   . Obesity   . OSA (obstructive sleep apnea)    USES CPAP   . Pancreatitis ~ 1980  . Peripheral vascular disease ([place])   . Pre-diabetes   . Prostate cancer ([place]) 04/2010    Patient Active Problem List   Diagnosis Date Noted  . Primary osteoarthritis of left knee 06/18/2020  . Pre-operative respiratory examination 06/05/2020  . Coronary artery disease involving coronary bypass graft of native heart without angina pectoris 04/07/2020  . Trigger thumb, left thumb 01/24/2020  . Overactive bladder 11/24/2019  . Left lateral epicondylitis 09/05/2019  . Degenerative arthritis of left knee 08/08/2019  . Educated about COVID-19 virus infection 04/20/2019  . Unstable angina ([place])   .  Greater trochanteric bursitis of left hip 10/27/2018  . Degenerative disc disease, lumbar 10/27/2018  . Abdominal muscle strain, initial encounter 06/16/2018  . Aortic atherosclerosis ([place]) 05/05/2018  . Hyperglycemia 10/07/2017  . Weakness of left leg 06/21/2017  . DJD (degenerative joint disease)   . Arthritis   . Neck pain 02/08/2017  . Venous insufficiency 12/29/2016  . Ganglion cyst of tendon sheath of right hand 12/29/2016  . Avulsion fracture of metatarsal bone of right foot 11/25/2016  . Gout 11/20/2016  . Obesity, Class I, BMI 30-34.9 10/03/2015  . Hereditary and idiopathic peripheral neuropathy 09/25/2014  . Allergic rhinitis 05/31/2014  . Fatigue 11/09/2013  . CKD (chronic kidney disease), stage III ([place]) 10/23/2013  . Dizziness 08/23/2013  . Neuropathy ([place]) 08/21/2013  . RLS (restless legs syndrome) 01/03/2013  . Chronic bilateral low back pain with left-sided sciatica 10/09/2011  . History of bladder cancer 04/01/2010  . OSTEOARTHRITIS, LOWER LEG, LEFT 10/04/2009  . ANEMIA, IRON DEFICIENCY 11/06/2008  . 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 Essential hypertension 02/21/2007  . OSA (obstructive sleep apnea) 11/02/2006  . Hyperlipidemia 10/12/2006  . Depression, major, single episode, in partial remission ([place]) 10/12/2006  . Coronary atherosclerosis 10/12/2006  . Asthma 10/12/2006  . GERD 10/12/2006  . Irritable bowel syndrome 10/12/2006    Past Surgical History:  Procedure  Laterality Date  . ANTERIOR CERVICAL DECOMP/DISCECTOMY FUSION  05/26/2006   PLATE PLACED  . BACK SURGERY    . CARDIAC CATHETERIZATION  11/20/1999  . CORONARY ARTERY BYPASS GRAFT  11/20/1999   SVG-RI1-RI2, SVG-OM, SVG-dRCA  . CORONARY STENT INTERVENTION N/A 04/13/2019   Procedure: CORONARY STENT INTERVENTION;  Surgeon: Burnell Blanks, MD;  Location: [facility];  Service: Cardiovascular;  Laterality: N/A;  . HERNIA REPAIR    . INGUINAL HERNIA REPAIR Left 01/02/2019    Procedure: OPEN LEFT INGUINAL HERNIA REPAIR WITH MESH;  Surgeon: Johnathan Hausen, MD;  Location: [facility];  Service: General;  Laterality: Left;  . KNEE ARTHROSCOPY Right 1992; 04/2008  . LAPAROSCOPIC CHOLECYSTECTOMY  2007  . LEFT HEART CATH AND CORS/GRAFTS ANGIOGRAPHY N/A 04/13/2019   Procedure: LEFT HEART CATH AND CORS/GRAFTS ANGIOGRAPHY;  Surgeon: Burnell Blanks, MD;  Location: [facility];  Service: Cardiovascular;  Laterality: N/A;  . NASAL SEPTUM SURGERY Right 2007  . ROBOT ASSISTED LAPAROSCOPIC RADICAL PROSTATECTOMY  04/2010  . THYROIDECTOMY, PARTIAL  1980  . 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   Procedure: LEFT TOTAL KNEE ARTHROPLASTY;  Surgeon: Melrose Nakayama, MD;  Location: [facility];  Service: Orthopedics;  Laterality: Left;       Family History  Problem Relation Age of Onset  . Heart disease Mother   . Hyperlipidemia Mother   . Heart disease Father   . Hyperlipidemia Father   . Diabetes Brother     Social History   Tobacco Use  . Smoking status: Former Smoker    Packs/day: 1.00    Years: 35.00    Pack years: 35.00    Types: Cigarettes    Quit date: 06/16/1992    Years since quitting: 28.1  . Smokeless tobacco: Never Used  Vaping Use  . Vaping Use: Never used  Substance Use Topics  . Alcohol use: Yes    Alcohol/week: 2.0 standard drinks    Types: 2 Shots of liquor per week  . Drug use: No    Home Medications Prior to Admission medications   Medication Sig Start Date End Date Taking? Authorizing Provider  valsartan (DIOVAN) 320 MG tablet Take 1 tablet (320 mg total) by mouth daily. 08/08/20   [name], MD  acetaminophen (TYLENOL) 500 MG tablet Take 1,000 mg by mouth every 6 (six) hours as needed for moderate pain or headache.    [provider]  albuterol (PROAIR HFA) 108 (90 BASE) MCG/ACT inhaler Inhale 1-2 puffs into the lungs every 6 (six) hours as needed for wheezing or shortness of breath. 10/19/14   [name], MD   amLODipine (NORVASC) 5 MG tablet Take 1 tablet (5 mg total) by mouth daily. 07/15/20   Pixie Casino, MD  aspirin EC 81 MG tablet Take 1 tablet (81 mg total) by mouth 2 (two) times daily after a meal. 06/18/20   Loni Dolly, PA-C  cholecalciferol (VITAMIN D3) 25 MCG (1000 UNIT) tablet Take 1,000 Units by mouth daily.    [provider]  clotrimazole-betamethasone (LOTRISONE) cream Apply 1 application topically See admin instructions. Twice daily every 4 days    [provider]  COVID-19 mRNA Vac-TriS, Pfizer, (PFIZER-BIONT COVID-19 VAC-TRIS) SUSP injection Inject into the muscle. 07/29/20   Carlyle Basques, MD  Cyanocobalamin (B-12) 2500 MCG TABS Take 1,250 mcg by mouth daily.    [provider]  diazepam (VALIUM) 5 MG tablet TAKE 1 TABLET (5 MG TOTAL) BY MOUTH AT BEDTIME AS NEEDED. Patient  taking differently: Take 5 mg by mouth at bedtime as needed (sleep). 04/22/20   [name], MD  EPINEPHrine 0.3 mg/0.3 mL IJ SOAJ injection Inject 0.3 mg into the muscle as needed for anaphylaxis. for allergic reaction Patient not taking: Reported on 07/29/2020 04/24/19   [provider]  ezetimibe (ZETIA) 10 MG tablet Take 1 tablet (10 mg total) by mouth daily. 02/09/20 05/09/20  Hilty, Nadean Corwin, MD  FLOVENT HFA 110 MCG/ACT inhaler Inhale 1 puff into the lungs 2 (two) times daily.  07/17/14   [provider]  fluticasone (FLONASE) 50 MCG/ACT nasal spray Place 1 spray into both nostrils 2 (two) times daily.  09/05/12   Ricard Dillon, MD  folic acid (FOLVITE) 956 MCG tablet Take 800 mcg by mouth daily.     [provider]  gabapentin (NEURONTIN) 300 MG capsule Take 1 cap in AM, 1 cap at noon, 2 caps at bedtime Patient taking differently: Take 300-600 mg by mouth See admin instructions. Take 300 mg in the morning, 300 mg at noon, and 600 mg at bedtime 05/01/20   Cameron Sprang, MD  Glucos-Chond-Hyal Ac-Ca Fructo (MOVE FREE JOINT HEALTH ADVANCE PO) Take 1  tablet by mouth daily.    [provider]  hydrochlorothiazide (HYDRODIURIL) 25 MG tablet TAKE 1 TABLET BY MOUTH EVERY DAY Patient taking differently: Take 25 mg by mouth daily. 03/29/20   [name], MD  HYDROmorphone (DILAUDID) 2 MG tablet TAKE 1 TABLETS EVERY 6-8 HOURS AS NEEDED FOR SEVERE POST OP PAIN 07/11/20   [provider]  ketoconazole (NIZORAL) 2 % cream Apply 1 application topically See admin instructions. Twice a day every other day 02/01/19   [provider]  loratadine (CLARITIN) 10 MG tablet Take 10 mg by mouth at bedtime.     [provider]  Miconazole Nitrate (LOTRIMIN AF DEODORANT POWDER) 2 % AERP Apply 1 spray topically daily as needed (jock itch).    [provider]  nitroGLYCERIN (NITROSTAT) 0.4 MG SL tablet PLACE 1 TABLET UNDER THE TONGUE EVERY 5 (FIVE) MINUTES AS NEEDED. Patient taking differently: Place 0.4 mg under the tongue every 5 (five) minutes as needed for chest pain. 03/11/20   [name], MD  nystatin-triamcinolone ointment [facility]) Apply 1 application topically See admin instructions. Twice daily every 4 days    [provider]  pantoprazole (PROTONIX) 40 MG tablet TAKE 1 TABLET BY MOUTH EVERY DAY 06/25/20   Hilty, Nadean Corwin, MD  polyethylene glycol powder (GLYCOLAX/MIRALAX) powder Take 17 g by mouth daily. Patient taking differently: Take 17 g by mouth in the morning and at bedtime. 08/29/14   Tanna Furry, MD  simvastatin (ZOCOR) 20 MG tablet TAKE 1 TABLET BY MOUTH EVERYDAY AT BEDTIME Patient taking differently: Take 20 mg by mouth at bedtime. 03/14/20   [name], MD  tiZANidine (ZANAFLEX) 4 MG tablet Take 1 tablet (4 mg total) by mouth every 6 (six) hours as needed for muscle spasms. 06/18/20 06/18/21  Loni Dolly, PA-C  Wheat Dextrin (BENEFIBER) POWD Take 1 Dose by mouth daily. 1 dose = 2 teaspoons    [provider]    Allergies    Benadryl [diphenhydramine], Bromfed, Clarithromycin,  Codeine, Doxycycline hyclate, Modafinil, Oxycodone-acetaminophen, Promethazine hcl, Quinolones, Telithromycin, Tramadol, Hydrocodone-acetaminophen, Statins, and Sulfonamide derivatives  Review of Systems   Review of Systems  Constitutional: Negative for fever.  HENT: Negative for ear pain and sore throat.   Eyes: Negative for pain.  Respiratory: Negative for cough.  Cardiovascular: Negative for chest pain.  Gastrointestinal: Negative for abdominal pain.  Genitourinary: Negative for flank pain.  Musculoskeletal: Negative for back pain.  Skin: Negative for color change and rash.  Neurological: Negative for syncope.  All other systems reviewed and are negative.   Physical Exam Updated Vital Signs BP 114/66   Pulse 78   Temp 97.9 F (36.6 C) (Oral)   Resp 15   Ht 5\' 6"  (1.676 m)   Wt 89.6 kg   SpO2 97%   BMI 31.88 kg/m   Physical Exam Constitutional:      General: He is not in acute distress.    Appearance: He is well-developed.  HENT:     Head: Normocephalic.     Nose: Nose normal.  Eyes:     Extraocular Movements: Extraocular movements intact.  Cardiovascular:     Rate and Rhythm: Normal rate.  Pulmonary:     Effort: Pulmonary effort is normal.  Musculoskeletal:     Comments: Patient able to flex the left knee with mild discomfort.  No gross deformity seen other than laceration on the anterior part of the knee.  Skin:    Coloration: Skin is not jaundiced.     Comments: 5.5 cm linear laceration in on left knee appears at the prior incision site.  Neurological:     Mental Status: He is alert. Mental status is at baseline.           ED Results / Procedures / Treatments   Labs (all labs ordered are listed, but only abnormal results are displayed) Labs Reviewed  CBC WITH DIFFERENTIAL/PLATELET - Abnormal; Notable for the following components:      Result Value   RBC 4.19 (*)    Hemoglobin 12.7 (*)    HCT 37.4 (*)    Abs Immature Granulocytes 0.09 (*)     All other components within normal limits  BASIC METABOLIC PANEL - Abnormal; Notable for the following components:   Sodium 134 (*)    Glucose, Bld 143 (*)    Creatinine, Ser 1.39 (*)    GFR, Estimated 52 (*)    All other components within normal limits  RESP PANEL BY RT-PCR (FLU A&B, COVID) ARPGX2    EKG None  Radiology DG Knee Complete 4 Views Left  Result Date: 08/11/2020 CLINICAL DATA:  Status post fall. EXAM: LEFT KNEE - COMPLETE 4+ VIEW COMPARISON:  March 20, 2020 FINDINGS: No evidence of an acute fracture or dislocation. A left knee replacement is seen without evidence of surrounding lucency to suggest the presence of hardware loosening or infection. A large joint effusion is seen. Multiple radiopaque surgical clips are noted along the posterior aspect of the left knee. Moderate severity vascular calcification is also present within this region. IMPRESSION: 1. Intact left knee replacement. 2. Large knee effusion without an acute osseous abnormality. Electronically Signed   By: Virgina Norfolk M.D.   On: 08/11/2020 00:14    Procedures .Marland KitchenLaceration Repair  Date/Time: 08/11/2020 1:36 AM Performed by: [name] Fuse, MD Authorized by: [name] Fuse, MD   Comments:     Fascia appears intact.  Wound anesthetized with lidocaine 1% no epinephrine total of 3 cc instilled.  Wound thoroughly irrigated under pressure with 2000 mL of sterile water.  Vancomycin powder applied to the laceration.  8 horizontal mattress sutures placed with 3-0 Ethilon sutures with good approximation of edges.  Bacitracin dressing applied topically.     Medications Ordered in ED Medications  HYDROmorphone (DILAUDID) injection  0.5 mg (has no administration in time range)  bacitracin ointment (has no administration in time range)  cephALEXin (KEFLEX) capsule 500 mg (has no administration in time range)  vancomycin (VANCOCIN) powder 500 mg (500 mg Other Given by Other 08/11/20 0055)  lidocaine (PF)  (XYLOCAINE) 1 % injection 5 mL (5 mLs Infiltration Given by Other 08/11/20 5284)    ED Course  I have reviewed the triage vital signs and the nursing notes.  Pertinent labs & imaging results that were available during my care of the patient were reviewed by me and considered in my medical decision making (see chart for details).    MDM Rules/Calculators/A&P                          Case discussed with orthopedic surgery on-call.  Final Clinical Impression(s) / ED Diagnoses Final diagnoses:  Laceration of left lower extremity, initial encounter    Rx / DC Orders ED Discharge Orders    None       [name] Fuse, MD 08/11/20 [phone]

## 2020-08-10 NOTE — Consult Note (Signed)
I was called by the emergency department for this patient.  He fell out of his bed and landed on his knee.  He is 2 months out from a total knee arthroplasty with Dr. Rhona Raider.  The surgical wound opened up with the fall. Previously was having no issues with the wound.  EMS brought him to the emergency department.    Photos taken by the emergency room physician demonstrate that the wound overlying the patellar region broke open with the fall.  The deep sutures remained.  No concern for traumatic arthrotomy or disruption of the prosthesis.  Discussed the case with the Dr. Almyra Free in the emergency department and he felt comfortable performing generous irrigation and cleaning of the wound and closure in the emergency department.   Patient will be placed in a soft dressing and a knee immobilizer, given an antibiotic in the ED and sent with keflex. Plan for him to follow up with Dr. Jerald Kief team this week in the office.

## 2020-08-10 NOTE — ED Triage Notes (Signed)
Pt from home s/p fall.  Pt reports attempting to reach for his cpap and fell out of bed and landed on knee.  Pt had knee surgery 8 weeks ago here at [facility] long, post op wound is now open.  EMS wrapped wound, no IV in place, no pain meds given because pt reported to ems that he is allergic to many pain meds and was unsure of the name of each one.

## 2020-08-11 DIAGNOSIS — M25462 Effusion, left knee: Secondary | ICD-10-CM | POA: Diagnosis not present

## 2020-08-11 DIAGNOSIS — M2548 Effusion, other site: Secondary | ICD-10-CM | POA: Diagnosis not present

## 2020-08-11 DIAGNOSIS — S81812A Laceration without foreign body, left lower leg, initial encounter: Secondary | ICD-10-CM | POA: Diagnosis not present

## 2020-08-11 LAB — BASIC METABOLIC PANEL
Anion gap: 6 (ref 5–15)
BUN: 21 mg/dL (ref 8–23)
CO2: 29 mmol/L (ref 22–32)
Calcium: 9.4 mg/dL (ref 8.9–10.3)
Chloride: 99 mmol/L (ref 98–111)
Creatinine, Ser: 1.39 mg/dL — ABNORMAL HIGH (ref 0.61–1.24)
GFR, Estimated: 52 mL/min — ABNORMAL LOW (ref >60)
Glucose, Bld: 143 mg/dL — ABNORMAL HIGH (ref 70–99)
Potassium: 3.8 mmol/L (ref 3.5–5.1)
Sodium: 134 mmol/L — ABNORMAL LOW (ref 135–145)

## 2020-08-11 LAB — CBC WITH DIFFERENTIAL/PLATELET
Abs Immature Granulocytes: 0.09 10*3/uL — ABNORMAL HIGH (ref 0.00–0.07)
Basophils Absolute: 0 10*3/uL (ref 0.0–0.1)
Basophils Relative: 1 %
Eosinophils Absolute: 0.4 10*3/uL (ref 0.0–0.5)
Eosinophils Relative: 4 %
HCT: 37.4 % — ABNORMAL LOW (ref 39.0–52.0)
Hemoglobin: 12.7 g/dL — ABNORMAL LOW (ref 13.0–17.0)
Immature Granulocytes: 1 %
Lymphocytes Relative: 14 %
Lymphs Abs: 1.2 10*3/uL (ref 0.7–4.0)
MCH: 30.3 pg (ref 26.0–34.0)
MCHC: 34 g/dL (ref 30.0–36.0)
MCV: 89.3 fL (ref 80.0–100.0)
Monocytes Absolute: 0.8 10*3/uL (ref 0.1–1.0)
Monocytes Relative: 9 %
Neutro Abs: 6.2 10*3/uL (ref 1.7–7.7)
Neutrophils Relative %: 71 %
Platelets: 210 10*3/uL (ref 150–400)
RBC: 4.19 MIL/uL — ABNORMAL LOW (ref 4.22–5.81)
RDW: 12.8 % (ref 11.5–15.5)
WBC: 8.7 10*3/uL (ref 4.0–10.5)
nRBC: 0 % (ref 0.0–0.2)

## 2020-08-11 LAB — RESP PANEL BY RT-PCR (FLU A&B, COVID) ARPGX2
Influenza A by PCR: NEGATIVE
Influenza B by PCR: NEGATIVE
SARS Coronavirus 2 by RT PCR: NEGATIVE

## 2020-08-11 MED ORDER — CEPHALEXIN 500 MG PO CAPS: 500.0000 mg | ORAL_CAPSULE | Freq: Four times a day (QID) | ORAL | 0 refills | Status: DC

## 2020-08-11 MED ORDER — CEPHALEXIN 500 MG PO CAPS
500.0000 mg | ORAL_CAPSULE | Freq: Once | ORAL | Status: AC
  Administered 2020-08-11: 500 mg via ORAL
  Filled 2020-08-11: qty 1

## 2020-08-11 MED ORDER — HYDROMORPHONE HCL 1 MG/ML IJ SOLN
0.5000 mg | Freq: Once | INTRAMUSCULAR | Status: AC
  Administered 2020-08-11: 0.5 mg via INTRAVENOUS
  Filled 2020-08-11: qty 1

## 2020-08-11 MED ORDER — BACITRACIN ZINC 500 UNIT/GM EX OINT
TOPICAL_OINTMENT | Freq: Two times a day (BID) | CUTANEOUS | Status: DC
  Administered 2020-08-11: 1 via TOPICAL
  Filled 2020-08-11: qty 1.8

## 2020-08-11 NOTE — Discharge Instructions (Addendum)
Keep the wound dry for the next 48 hours.  Afterwards you may wash gently with soap and water.  Keep the wound covered while at home.  Return immediately if you have purulent drainage fevers pain increased redness or any additional concerns.  Follow-up with your orthopedic surgeon on Tuesday as scheduled.

## 2020-08-13 ENCOUNTER — Telehealth: Payer: Self-pay | Admitting: Cardiology

## 2020-08-13 DIAGNOSIS — Z9889 Other specified postprocedural states: Secondary | ICD-10-CM | POA: Diagnosis not present

## 2020-08-13 NOTE — Telephone Encounter (Signed)
Patient can take his hydromorphone 2mg  every 6-8 hours as needed for pain.  Recommended he not take any other OTC pain meds other than tylenol due to history of hypertension and CAD

## 2020-08-13 NOTE — Telephone Encounter (Signed)
Called spoke to patient . Information given per pharmacist recommendation for relief of pain.  patient verbalized understanding.

## 2020-08-13 NOTE — Telephone Encounter (Signed)
Will defer to pharmacist for  alteratives for over the counter pain medications

## 2020-08-13 NOTE — Telephone Encounter (Signed)
Joshua [name] is calling stating he recently had a knee replacement and had an accident on Saturday causing him to split it open. He got it sewed back up on Sunday and was given some narcotic pain killers. He states they keep advising him not to take the pain meds due to it being narcotics. He is wanting to know if there's any over the counter pain killers he can take as an alternative such as aleve. He states he does take tylenol, but it's as if it's nothing more than a sugar pill with the pain he is experiencing. He is unable to even walk upstairs to his computer to send a Mychart message. Please advise.

## 2020-08-15 ENCOUNTER — Ambulatory Visit
Admission: RE | Admit: 2020-08-15 | Discharge: 2020-08-15 | Disposition: A | Discharge status: Home / Self Care | Payer: PPO | Source: Ambulatory Visit | Attending: Family Medicine | Admitting: Family Medicine

## 2020-08-15 DIAGNOSIS — E049 Nontoxic goiter, unspecified: Secondary | ICD-10-CM

## 2020-08-15 DIAGNOSIS — E041 Nontoxic single thyroid nodule: Secondary | ICD-10-CM | POA: Diagnosis not present

## 2020-08-16 ENCOUNTER — Encounter: Payer: Self-pay | Admitting: Family Medicine

## 2020-08-20 ENCOUNTER — Telehealth: Payer: Self-pay | Admitting: Pulmonary Disease

## 2020-08-20 DIAGNOSIS — G4733 Obstructive sleep apnea (adult) (pediatric): Secondary | ICD-10-CM

## 2020-08-20 NOTE — Telephone Encounter (Signed)
Called and spoke with Patient.  Patient stated he had knee surgery 9 weeks ago and has lost 15 pounds.  Patient stated that his cpap mask does not fit anymore.  Patient stated he is getting leaks according to his cpap and has tightened his cpap mask as tight as he can, but he is still having leaks around the mask.  Patient stated he had seen [facility] for a mask fitting twice, before Covid, but Covid spread, and things were changed.  Patient stated he never went for his third mask fitting. Patient stated he is a back sleeper and preferred nasal mask. Patient has not called [facility] about issue. Patient stated that at this time he can wear his cpap and, because of his knee pain, he would have  difficulty picking up a new mask.  Message routed to [facility], NP to advise- Dr. Halford Chessman covering nights at hospital LOV 06/05/20-Beth, NP   Instructions  You are considered low risk for inter/post-op pulmonary complications following knee surgery .   Orthopedics has already ordered CXR to be completed pre-op, we do not need to do one today   Recommendations: - Early ambulation after surgery - Use incentive spirometer after surgery - Continue Flovent 1 puff twice daily  - Continue to wear CPAP every night for 4-6 hours or longer. Museum/gallery conservator with you day of surgery   Orders DME order for [employer] Dream wisp full face mask size medium   Follow-up 1 year with Dr. Halford Chessman or sooner if needed

## 2020-08-20 NOTE — Telephone Encounter (Signed)
ATC, left VM for callback. 

## 2020-08-20 NOTE — Telephone Encounter (Signed)
Would he like [place] to send in an order for nasal mask or does he want CPAP fitting? Either is fine with me.

## 2020-08-20 NOTE — Telephone Encounter (Signed)
Patient has been scheduled

## 2020-08-21 ENCOUNTER — Ambulatory Visit (INDEPENDENT_AMBULATORY_CARE_PROVIDER_SITE_OTHER): Payer: PPO | Admitting: Family Medicine

## 2020-08-21 ENCOUNTER — Encounter: Payer: Self-pay | Admitting: Family Medicine

## 2020-08-21 ENCOUNTER — Other Ambulatory Visit: Payer: Self-pay

## 2020-08-21 VITALS — BP 128/72 | HR 79 | Temp 97.7°F | Wt 195.6 lb

## 2020-08-21 DIAGNOSIS — Z9889 Other specified postprocedural states: Secondary | ICD-10-CM | POA: Diagnosis not present

## 2020-08-21 DIAGNOSIS — F4321 Adjustment disorder with depressed mood: Secondary | ICD-10-CM

## 2020-08-21 DIAGNOSIS — F324 Major depressive disorder, single episode, in partial remission: Secondary | ICD-10-CM | POA: Diagnosis not present

## 2020-08-21 DIAGNOSIS — F418 Other specified anxiety disorders: Secondary | ICD-10-CM

## 2020-08-21 MED ORDER — BUSPIRONE HCL 5 MG PO TABS: 5.0000 mg | ORAL_TABLET | Freq: Two times a day (BID) | ORAL | 2 refills | Status: DC | PRN

## 2020-08-21 NOTE — Progress Notes (Signed)
Phone [phone] In person visit   Subjective:   Joshua [name] is a 80 y.o. year old very pleasant male patient who presents for/with See problem oriented charting Chief Complaint  Patient presents with  . Depression    Knee replacement 9 weeks ago, started having feelings of anger and sadness - not able to do what he has in the past.  . Fall    Had been sleeping on the couch due to knee replacement, but recently went back to sleeping in bed. [place] out of bed a week ago. Sent by ortho today - stitches removed and healing nicely    This visit occurred during the SARS-CoV-2 public health emergency.  Safety protocols were in place, including screening questions prior to the visit, additional usage of staff PPE, and extensive cleaning of exam room while observing appropriate contact time as indicated for disinfecting solutions.   Past Medical History-  Patient Active Problem List   Diagnosis Date Noted  . Coronary atherosclerosis 10/12/2006    Priority: High  . Irritable bowel syndrome 10/12/2006    Priority: High  . Overactive bladder 11/24/2019    Priority: Medium  . Hyperglycemia 10/07/2017    Priority: Medium  . Gout 11/20/2016    Priority: Medium  . CKD (chronic kidney disease), stage III ([place]) 10/23/2013    Priority: Medium  . Neuropathy ([place]) 08/21/2013    Priority: Medium  . History of bladder cancer 04/01/2010    Priority: Medium  . Essential hypertension 02/21/2007    Priority: Medium  . Hyperlipidemia 10/12/2006    Priority: Medium  . Depression, major, single episode, in partial remission ([place]) 10/12/2006    Priority: Medium  . Asthma 10/12/2006    Priority: Medium  . Aortic atherosclerosis ([place]) 05/05/2018    Priority: Low  . DJD (degenerative joint disease)     Priority: Low  . Arthritis     Priority: Low  . Neck pain 02/08/2017    Priority: Low  . Venous insufficiency 12/29/2016    Priority: Low  . Ganglion cyst of tendon sheath of right hand  12/29/2016    Priority: Low  . Avulsion fracture of metatarsal bone of right foot 11/25/2016    Priority: Low  . Obesity, Class I, BMI 30-34.9 10/03/2015    Priority: Low  . Allergic rhinitis 05/31/2014    Priority: Low  . Fatigue 11/09/2013    Priority: Low  . Dizziness 08/23/2013    Priority: Low  . RLS (restless legs syndrome) 01/03/2013    Priority: Low  . Chronic bilateral low back pain with left-sided sciatica 10/09/2011    Priority: Low  . OSTEOARTHRITIS, LOWER LEG, LEFT 10/04/2009    Priority: Low  . ANEMIA, IRON DEFICIENCY 11/06/2008    Priority: Low  . 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 OSA (obstructive sleep apnea) 11/02/2006    Priority: Low  . GERD 10/12/2006    Priority: Low  . Primary osteoarthritis of left knee 06/18/2020  . Pre-operative respiratory examination 06/05/2020  . Coronary artery disease involving coronary bypass graft of native heart without angina pectoris 04/07/2020  . Trigger thumb, left thumb 01/24/2020  . Left lateral epicondylitis 09/05/2019  . Degenerative arthritis of left knee 08/08/2019  . Educated about COVID-19 virus infection 04/20/2019  . Unstable angina ([place])   . Greater trochanteric bursitis of left hip 10/27/2018  . Degenerative disc disease, lumbar 10/27/2018  . Abdominal muscle strain, initial encounter 06/16/2018  . Weakness  of left leg 06/21/2017  . Hereditary and idiopathic peripheral neuropathy 09/25/2014    Medications- reviewed and updated Current Outpatient Medications  Medication Sig Dispense Refill  . acetaminophen (TYLENOL) 500 MG tablet Take 1,000 mg by mouth every 6 (six) hours as needed for moderate pain or headache.    . albuterol (PROAIR HFA) 108 (90 BASE) MCG/ACT inhaler Inhale 1-2 puffs into the lungs every 6 (six) hours as needed for wheezing or shortness of breath. 18 g 3  . amLODipine (NORVASC) 5 MG tablet Take 1 tablet (5 mg total) by mouth daily. 90 tablet 3  . aspirin EC 81  MG tablet Take 1 tablet (81 mg total) by mouth 2 (two) times daily after a meal. 30 tablet 11  . busPIRone (BUSPAR) 5 MG tablet Take 1 tablet (5 mg total) by mouth 2 (two) times daily as needed. 40 tablet 2  . cephALEXin (KEFLEX) 500 MG capsule Take 1 capsule (500 mg total) by mouth 4 (four) times daily. 20 capsule 0  . cholecalciferol (VITAMIN D3) 25 MCG (1000 UNIT) tablet Take 1,000 Units by mouth daily.    . clotrimazole-betamethasone (LOTRISONE) cream Apply 1 application topically See admin instructions. Twice daily every 4 days    . COVID-19 mRNA Vac-TriS, Pfizer, (PFIZER-BIONT COVID-19 VAC-TRIS) SUSP injection Inject into the muscle. 0.3 mL 0  . Cyanocobalamin (B-12) 2500 MCG TABS Take 1,250 mcg by mouth daily.    . diazepam (VALIUM) 5 MG tablet TAKE 1 TABLET (5 MG TOTAL) BY MOUTH AT BEDTIME AS NEEDED. (Patient taking differently: Take 5 mg by mouth at bedtime as needed (sleep).) 30 tablet 1  . EPINEPHrine 0.3 mg/0.3 mL IJ SOAJ injection Inject 0.3 mg into the muscle as needed for anaphylaxis. for allergic reaction    . FLOVENT HFA 110 MCG/ACT inhaler Inhale 1 puff into the lungs 2 (two) times daily.   5  . fluticasone (FLONASE) 50 MCG/ACT nasal spray Place 1 spray into both nostrils 2 (two) times daily.     . folic acid (FOLVITE) 258 MCG tablet Take 800 mcg by mouth daily.     [name] gabapentin (NEURONTIN) 300 MG capsule Take 1 cap in AM, 1 cap at noon, 2 caps at bedtime (Patient taking differently: Take 300-600 mg by mouth See admin instructions. Take 300 mg in the morning, 300 mg at noon, and 600 mg at bedtime) 360 capsule 3  . Glucos-Chond-Hyal Ac-Ca Fructo (MOVE FREE JOINT HEALTH ADVANCE PO) Take 1 tablet by mouth daily.    . hydrochlorothiazide (HYDRODIURIL) 25 MG tablet TAKE 1 TABLET BY MOUTH EVERY DAY (Patient taking differently: Take 25 mg by mouth daily.) 90 tablet 2  . HYDROmorphone (DILAUDID) 2 MG tablet TAKE 1 TABLETS EVERY 6-8 HOURS AS NEEDED FOR SEVERE POST OP PAIN    . ketoconazole  (NIZORAL) 2 % cream Apply 1 application topically See admin instructions. Twice a day every other day    . loratadine (CLARITIN) 10 MG tablet Take 10 mg by mouth at bedtime.     . Miconazole Nitrate (LOTRIMIN AF DEODORANT POWDER) 2 % AERP Apply 1 spray topically daily as needed (jock itch).    . nitroGLYCERIN (NITROSTAT) 0.4 MG SL tablet PLACE 1 TABLET UNDER THE TONGUE EVERY 5 (FIVE) MINUTES AS NEEDED. (Patient taking differently: Place 0.4 mg under the tongue every 5 (five) minutes as needed for chest pain.) 75 tablet 1  . nystatin-triamcinolone ointment (MYCOLOG) Apply 1 application topically See admin instructions. Twice daily every 4 days    .  pantoprazole (PROTONIX) 40 MG tablet TAKE 1 TABLET BY MOUTH EVERY DAY 90 tablet 3  . polyethylene glycol powder (GLYCOLAX/MIRALAX) powder Take 17 g by mouth daily. (Patient taking differently: Take 17 g by mouth in the morning and at bedtime.) 255 g 0  . simvastatin (ZOCOR) 20 MG tablet TAKE 1 TABLET BY MOUTH EVERYDAY AT BEDTIME (Patient taking differently: Take 20 mg by mouth at bedtime.) 90 tablet 3  . tiZANidine (ZANAFLEX) 4 MG tablet Take 1 tablet (4 mg total) by mouth every 6 (six) hours as needed for muscle spasms. 40 tablet 1  . valsartan (DIOVAN) 320 MG tablet Take 1 tablet (320 mg total) by mouth daily. 90 tablet 3  . Wheat Dextrin (BENEFIBER) POWD Take 1 Dose by mouth daily. 1 dose = 2 teaspoons    . ezetimibe (ZETIA) 10 MG tablet Take 1 tablet (10 mg total) by mouth daily. 90 tablet 3   No current facility-administered medications for this visit.     Objective:  BP 128/72   Pulse 79   Temp 97.7 F (36.5 C) (Temporal)   Wt 195 lb 9.6 oz (88.7 kg)   SpO2 98%   BMI 31.57 kg/m  Gen: NAD, resting comfortably, seems somewhat discouraged today compared to his baseline     Assessment and Plan  #SocialUpdate-He is actively playing poker and meeting up with friends now-he thinks this is helping his spirits some  #Depression, major, single  episode- history of depression but now with situational depression S: Patient has not tolerated medication in the past-feels too tired and sleepy on SSRIs -Has declined counseling and continues to decline counseling-has done this in the past and only somewhat helpful. -Patient noted when he took Lexapro recently that he had significant dizziness- it was ultimately discontinued-we added this intolerance to medication list  Recent history- patient reports being Short tempered and more irritable since his surgery.  He is very discouraged by being 9 weeks out and still having significant left knee pain.  He still has 14 tablets left of the original prescription for Dilaudid but he states he suffers from pain on a pretty regular basis.  Pain worsened after fall that split his wound open and required repeat sutures-thankfully this is looking much better  Patient does not want to retrial SSRI.  He has had some success with counseling in the past but would need the right fit A/P: History of depression with recent control prior to surgery with recent situational depression with knee surgery (will list as partial remission at this time) and chronic pain since that time-patient also with some situational anxiety.  Patient felt like he needed some counseling in office today-I did my best to encourage him to continue to move forward with 1 day at a time.  PHQ-9 elevated to 10 today and GAD-7 up to 14 -I also think he would benefit from professional counseling services-recommended [facility] -With significant anxiety/irritability we also opted to trial as needed buspirone-discussed may be more effective with regular use but given prior side effects we wanted to try sparing use first -Patient with history of chronic pain in the past such as in groin area-seems to have hypersensitivity to pain stimuli and I think that is making the healing process/managing pain even more difficult-I really think he would  benefit from therapy/counseling given his current ongoing battle with the knee pain-I am proud of him for considering therapy  Recommended follow up: We opted to keep August visit or sooner if needed  Future Appointments  Date Time Provider [place]  09/11/2020 11:00 AM [facility] CCM PHARMACIST [facility]  11/04/2020  9:20 AM [name], MD [facility]  01/08/2021  8:30 AM Cameron Sprang, MD [facility] None  05/15/2021  9:30 AM [facility] HEALTH COACH [facility]   Lab/Order associations:   ICD-10-CM   1. Situational depression  F43.21   2. Depression, major, single episode, in partial remission ([place])  F32.4   3. Situational anxiety  F41.8     Meds ordered this encounter  Medications  . busPIRone (BUSPAR) 5 MG tablet    Sig: Take 1 tablet (5 mg total) by mouth 2 (two) times daily as needed.    Dispense:  40 tablet    Refill:  2   I,Alexis Bryant,acting as a scribe for [name], MD.,have documented all relevant documentation on the behalf of [name], MD,as directed by  [name], MD while in the presence of [name], MD.   I, [name], MD, have reviewed all documentation for this visit. The documentation on 08/21/20 for the exam, diagnosis, procedures, and orders are all accurate and complete.   Return precautions advised.  [name], MD

## 2020-08-21 NOTE — Patient Instructions (Addendum)
Please call [phone] to schedule a visit with [facility]  Keep august 15th visit, call me if you need to chat sooner  Can try buspirone 5 mg (low dose) as needed for anxiety

## 2020-08-21 NOTE — Progress Notes (Deleted)
Duplicate note

## 2020-08-21 NOTE — Telephone Encounter (Signed)
Patient returned call and I was able to speak with him today.  He states he has lost about 15 lbs and his mask does not fit properly now and the machine has the red face on it.  He has tightened it as much as he can until it is now painful to wear.  I provided the recommendations per BW regarding ordering the nasal mask versus a mask fitting. He states he does not handle the nasal mask well and would prefer to do the mask fitting.  Advised I would place the order and then he would receive a call to set up the mask fitting.  He verbalized understanding.  Nothing further needed.

## 2020-08-21 NOTE — Telephone Encounter (Signed)
LMTCB x 2  Will hold another day since called so late in the day on 5/31

## 2020-08-22 ENCOUNTER — Other Ambulatory Visit: Payer: Self-pay | Admitting: *Deleted

## 2020-08-22 DIAGNOSIS — E785 Hyperlipidemia, unspecified: Secondary | ICD-10-CM

## 2020-08-26 ENCOUNTER — Ambulatory Visit: Payer: PPO | Admitting: Family Medicine

## 2020-08-26 DIAGNOSIS — J301 Allergic rhinitis due to pollen: Secondary | ICD-10-CM | POA: Diagnosis not present

## 2020-08-26 DIAGNOSIS — J3081 Allergic rhinitis due to animal (cat) (dog) hair and dander: Secondary | ICD-10-CM | POA: Diagnosis not present

## 2020-08-26 DIAGNOSIS — J3089 Other allergic rhinitis: Secondary | ICD-10-CM | POA: Diagnosis not present

## 2020-08-30 ENCOUNTER — Telehealth: Payer: Self-pay

## 2020-08-30 NOTE — Chronic Care Management (AMB) (Signed)
Chronic Care Management Pharmacy Assistant   Name: TRINITY HYLAND  MRN: [id number] DOB: 03/18/1941   Reason for Encounter: General adherence  Recent office visits:  08/21/20 [name] MD (PCP)- Office visit for depression.  STARTED Buspirone HCL 5mg  take 1 twice daily as needed. Follow up as scheduled.   Recent consult visits:  08/26/20-Meg Orvil Feil (Allergist)- No data available.   Hospital visits:  Medication Reconciliation was completed by comparing discharge summary, patient's EMR and Pharmacy list, and upon discussion with patient.  Admitted to the hospital on 08/10/20 due to Laceration of left lower extremity. Discharge date was 08/11/20. Discharged from [facility]?Medications Started at [facility] Discharge:?? -started Cephalexin 500mg  take 1 four times daily due to Laceration.   Medication Changes at Hospital Discharge: -Changed None  Medications Discontinued at Hospital Discharge: -Stopped None  Medications that remain the same after Hospital Discharge:??  -All other medications will remain the same.    Medications: Outpatient Encounter Medications as of 08/30/2020  Medication Sig Note   acetaminophen (TYLENOL) 500 MG tablet Take 1,000 mg by mouth every 6 (six) hours as needed for moderate pain or headache.    albuterol (PROAIR HFA) 108 (90 BASE) MCG/ACT inhaler Inhale 1-2 puffs into the lungs every 6 (six) hours as needed for wheezing or shortness of breath.    amLODipine (NORVASC) 5 MG tablet Take 1 tablet (5 mg total) by mouth daily.    aspirin EC 81 MG tablet Take 1 tablet (81 mg total) by mouth 2 (two) times daily after a meal. 06/22/2020: Had 3 tablets on 03/31 so did not have on 04/01   busPIRone (BUSPAR) 5 MG tablet Take 1 tablet (5 mg total) by mouth 2 (two) times daily as needed.    cephALEXin (KEFLEX) 500 MG capsule Take 1 capsule (500 mg total) by mouth 4 (four) times daily.    cholecalciferol (VITAMIN D3) 25 MCG (1000 UNIT)  tablet Take 1,000 Units by mouth daily.    clotrimazole-betamethasone (LOTRISONE) cream Apply 1 application topically See admin instructions. Twice daily every 4 days    COVID-19 mRNA Vac-TriS, Pfizer, (PFIZER-BIONT COVID-19 VAC-TRIS) SUSP injection Inject into the muscle.    Cyanocobalamin (B-12) 2500 MCG TABS Take 1,250 mcg by mouth daily.    diazepam (VALIUM) 5 MG tablet TAKE 1 TABLET (5 MG TOTAL) BY MOUTH AT BEDTIME AS NEEDED. (Patient taking differently: Take 5 mg by mouth at bedtime as needed (sleep).)    EPINEPHrine 0.3 mg/0.3 mL IJ SOAJ injection Inject 0.3 mg into the muscle as needed for anaphylaxis. for allergic reaction    ezetimibe (ZETIA) 10 MG tablet Take 1 tablet (10 mg total) by mouth daily.    FLOVENT HFA 110 MCG/ACT inhaler Inhale 1 puff into the lungs 2 (two) times daily.     fluticasone (FLONASE) 50 MCG/ACT nasal spray Place 1 spray into both nostrils 2 (two) times daily.     folic acid (FOLVITE) 536 MCG tablet Take 800 mcg by mouth daily.     gabapentin (NEURONTIN) 300 MG capsule Take 1 cap in AM, 1 cap at noon, 2 caps at bedtime (Patient taking differently: Take 300-600 mg by mouth See admin instructions. Take 300 mg in the morning, 300 mg at noon, and 600 mg at bedtime)    Glucos-Chond-Hyal Ac-Ca Fructo (MOVE FREE JOINT HEALTH ADVANCE PO) Take 1 tablet by mouth daily.    hydrochlorothiazide (HYDRODIURIL) 25 MG tablet TAKE 1 TABLET BY MOUTH EVERY DAY (Patient  taking differently: Take 25 mg by mouth daily.)    HYDROmorphone (DILAUDID) 2 MG tablet TAKE 1 TABLETS EVERY 6-8 HOURS AS NEEDED FOR SEVERE POST OP PAIN    ketoconazole (NIZORAL) 2 % cream Apply 1 application topically See admin instructions. Twice a day every other day    loratadine (CLARITIN) 10 MG tablet Take 10 mg by mouth at bedtime.     Miconazole Nitrate (LOTRIMIN AF DEODORANT POWDER) 2 % AERP Apply 1 spray topically daily as needed (jock itch).    nitroGLYCERIN (NITROSTAT) 0.4 MG SL tablet PLACE 1 TABLET UNDER THE  TONGUE EVERY 5 (FIVE) MINUTES AS NEEDED. (Patient taking differently: Place 0.4 mg under the tongue every 5 (five) minutes as needed for chest pain.)    nystatin-triamcinolone ointment (MYCOLOG) Apply 1 application topically See admin instructions. Twice daily every 4 days    pantoprazole (PROTONIX) 40 MG tablet TAKE 1 TABLET BY MOUTH EVERY DAY    polyethylene glycol powder (GLYCOLAX/MIRALAX) powder Take 17 g by mouth daily. (Patient taking differently: Take 17 g by mouth in the morning and at bedtime.)    simvastatin (ZOCOR) 20 MG tablet TAKE 1 TABLET BY MOUTH EVERYDAY AT BEDTIME (Patient taking differently: Take 20 mg by mouth at bedtime.)    tiZANidine (ZANAFLEX) 4 MG tablet Take 1 tablet (4 mg total) by mouth every 6 (six) hours as needed for muscle spasms.    valsartan (DIOVAN) 320 MG tablet Take 1 tablet (320 mg total) by mouth daily.    Wheat Dextrin (BENEFIBER) POWD Take 1 Dose by mouth daily. 1 dose = 2 teaspoons    No facility-administered encounter medications on file as of 08/30/2020.   Have you had any problems recently with your health? Patient denies any new problems.  He is still in pain but is managing.   Have you had any problems with your pharmacy? Patient denies any problems with obtaining medications from the pharmacy.   What issues or side effects are you having with your medications? Patient denies side effects with medications.  He took one dose of Buspirone and no side effects reported.   What would you like me to pass along to [facility] Nasuti Potts,CPP for them to help you with?  Patient would like to thank you for having me call and check on him.  He is doing well.   What can we do to take care of you better? Patient stated there is nothing else we can do to take better care of him.   Star Rating Drugs:  Simvastatin 20mg  Valsartan 320mg

## 2020-09-03 ENCOUNTER — Other Ambulatory Visit: Payer: Self-pay

## 2020-09-03 ENCOUNTER — Ambulatory Visit (HOSPITAL_BASED_OUTPATIENT_CLINIC_OR_DEPARTMENT_OTHER): Payer: PPO | Attending: Primary Care | Admitting: Internal Medicine

## 2020-09-03 DIAGNOSIS — G4733 Obstructive sleep apnea (adult) (pediatric): Secondary | ICD-10-CM

## 2020-09-04 DIAGNOSIS — L602 Onychogryphosis: Secondary | ICD-10-CM | POA: Diagnosis not present

## 2020-09-04 DIAGNOSIS — L84 Corns and callosities: Secondary | ICD-10-CM | POA: Diagnosis not present

## 2020-09-04 DIAGNOSIS — M205X2 Other deformities of toe(s) (acquired), left foot: Secondary | ICD-10-CM | POA: Diagnosis not present

## 2020-09-04 DIAGNOSIS — M205X1 Other deformities of toe(s) (acquired), right foot: Secondary | ICD-10-CM | POA: Diagnosis not present

## 2020-09-11 ENCOUNTER — Telehealth: Payer: PPO

## 2020-09-12 DIAGNOSIS — J301 Allergic rhinitis due to pollen: Secondary | ICD-10-CM | POA: Diagnosis not present

## 2020-09-12 DIAGNOSIS — J3089 Other allergic rhinitis: Secondary | ICD-10-CM | POA: Diagnosis not present

## 2020-09-12 DIAGNOSIS — J3081 Allergic rhinitis due to animal (cat) (dog) hair and dander: Secondary | ICD-10-CM | POA: Diagnosis not present

## 2020-09-18 DIAGNOSIS — M25462 Effusion, left knee: Secondary | ICD-10-CM | POA: Diagnosis not present

## 2020-09-18 DIAGNOSIS — J301 Allergic rhinitis due to pollen: Secondary | ICD-10-CM | POA: Diagnosis not present

## 2020-09-18 DIAGNOSIS — J3089 Other allergic rhinitis: Secondary | ICD-10-CM | POA: Diagnosis not present

## 2020-09-18 DIAGNOSIS — Z96652 Presence of left artificial knee joint: Secondary | ICD-10-CM | POA: Diagnosis not present

## 2020-09-18 DIAGNOSIS — Z471 Aftercare following joint replacement surgery: Secondary | ICD-10-CM | POA: Diagnosis not present

## 2020-09-18 DIAGNOSIS — J3081 Allergic rhinitis due to animal (cat) (dog) hair and dander: Secondary | ICD-10-CM | POA: Diagnosis not present

## 2020-09-25 ENCOUNTER — Other Ambulatory Visit: Payer: Self-pay

## 2020-09-25 ENCOUNTER — Ambulatory Visit (INDEPENDENT_AMBULATORY_CARE_PROVIDER_SITE_OTHER): Payer: PPO

## 2020-09-25 ENCOUNTER — Ambulatory Visit: Payer: PPO | Admitting: Family Medicine

## 2020-09-25 ENCOUNTER — Ambulatory Visit: Payer: Self-pay

## 2020-09-25 ENCOUNTER — Encounter: Payer: Self-pay | Admitting: Family Medicine

## 2020-09-25 VITALS — BP 110/70 | HR 49 | Ht 66.0 in | Wt 197.0 lb

## 2020-09-25 DIAGNOSIS — T8484XA Pain due to internal orthopedic prosthetic devices, implants and grafts, initial encounter: Secondary | ICD-10-CM

## 2020-09-25 DIAGNOSIS — Z96652 Presence of left artificial knee joint: Secondary | ICD-10-CM | POA: Diagnosis not present

## 2020-09-25 DIAGNOSIS — M25562 Pain in left knee: Secondary | ICD-10-CM | POA: Diagnosis not present

## 2020-09-25 DIAGNOSIS — M2550 Pain in unspecified joint: Secondary | ICD-10-CM

## 2020-09-25 DIAGNOSIS — G8929 Other chronic pain: Secondary | ICD-10-CM | POA: Diagnosis not present

## 2020-09-25 LAB — IBC PANEL
Iron: 98 ug/dL (ref 42–165)
Saturation Ratios: 24.7 % (ref 20.0–50.0)
Transferrin: 283 mg/dL (ref 212.0–360.0)

## 2020-09-25 LAB — FERRITIN: Ferritin: 149.3 ng/mL (ref 22.0–322.0)

## 2020-09-25 LAB — CBC WITH DIFFERENTIAL/PLATELET
Basophils Absolute: 0 10*3/uL (ref 0.0–0.1)
Basophils Relative: 0.7 % (ref 0.0–3.0)
Eosinophils Absolute: 0.1 10*3/uL (ref 0.0–0.7)
Eosinophils Relative: 1.9 % (ref 0.0–5.0)
HCT: 39.5 % (ref 39.0–52.0)
Hemoglobin: 13.9 g/dL (ref 13.0–17.0)
Lymphocytes Relative: 16.3 % (ref 12.0–46.0)
Lymphs Abs: 1.2 10*3/uL (ref 0.7–4.0)
MCHC: 35.3 g/dL (ref 30.0–36.0)
MCV: 86.9 fl (ref 78.0–100.0)
Monocytes Absolute: 0.7 10*3/uL (ref 0.1–1.0)
Monocytes Relative: 9.9 % (ref 3.0–12.0)
Neutro Abs: 5.1 10*3/uL (ref 1.4–7.7)
Neutrophils Relative %: 71.2 % (ref 43.0–77.0)
Platelets: 234 10*3/uL (ref 150.0–400.0)
RBC: 4.54 Mil/uL (ref 4.22–5.81)
RDW: 13.9 % (ref 11.5–15.5)
WBC: 7.2 10*3/uL (ref 4.0–10.5)

## 2020-09-25 LAB — COMPREHENSIVE METABOLIC PANEL
ALT: 23 U/L (ref 0–53)
AST: 18 U/L (ref 0–37)
Albumin: 4.3 g/dL (ref 3.5–5.2)
Alkaline Phosphatase: 56 U/L (ref 39–117)
BUN: 17 mg/dL (ref 6–23)
CO2: 31 mEq/L (ref 19–32)
Calcium: 10.2 mg/dL (ref 8.4–10.5)
Chloride: 97 mEq/L (ref 96–112)
Creatinine, Ser: 1.16 mg/dL (ref 0.40–1.50)
GFR: 60.71 mL/min (ref >60.00)
Glucose, Bld: 94 mg/dL (ref 70–99)
Potassium: 4.4 mEq/L (ref 3.5–5.1)
Sodium: 134 mEq/L — ABNORMAL LOW (ref 135–145)
Total Bilirubin: 0.7 mg/dL (ref 0.2–1.2)
Total Protein: 7.1 g/dL (ref 6.0–8.3)

## 2020-09-25 LAB — SEDIMENTATION RATE: Sed Rate: 22 mm/hr — ABNORMAL HIGH (ref 0–20)

## 2020-09-25 LAB — VITAMIN D 25 HYDROXY (VIT D DEFICIENCY, FRACTURES): VITD: 50.01 ng/mL (ref 30.00–100.00)

## 2020-09-25 LAB — C-REACTIVE PROTEIN: CRP: <1 mg/dL (ref 0.5–20.0)

## 2020-09-25 NOTE — Patient Instructions (Addendum)
Good to see you Labs today to see why you are not healing  Left knee xray I will talk to [facility] See me again in 2-3 weeks ok to double book If worsening pain or swelling seek medical attention

## 2020-09-25 NOTE — Progress Notes (Signed)
[place] Phone: [phone] Subjective:   [name], am serving as a scribe for Dr. Hulan Saas.  This visit occurred during the SARS-CoV-2 public health emergency.  Safety protocols were in place, including screening questions prior to the visit, additional usage of staff PPE, and extensive cleaning of exam room while observing appropriate contact time as indicated for disinfecting solutions.    I'm seeing this patient by the request  of:  [name], MD  CC: Left knee pain after replacement  EXH:[id number]  Joshua [name] is a 80 y.o. male coming in with complaint of L knee pain. TKR 06/18/2020. Knee aspiration and injection by Dr. Latanya Maudlin on 09/18/2020. Patient states that his anterior knee pain is constant. Patient did suffer a fall and broke open surgical wound. No additional pain in joint post fall. Patient is able to do stairs without increase in pain. Pain is sharp and constant. Pain is 6-7 out of 10. Has used Tramadol and ice for pain.   Patient was doing PT but was cleared after a few weeks.   Patient did have injection that he felt did not help his pain.        Past Medical History:  Diagnosis Date   Acute medial meniscal tear    Anemia 2015   Arthritis    "middle finger right hand; right knee; neck" (02/06/2014)   Asthma    seasonal   Bladder cancer ([place]) 04/2010   "cauterized during prostate OR"   CAD (coronary artery disease)    CABG 2001   Diverticulosis    DJD (degenerative joint disease)    BACK   GERD (gastroesophageal reflux disease)    H/O inguinal hernia repair 12/2018   History of gout    Hyperlipidemia    Hypertension    IBS (irritable bowel syndrome)    Obesity    OSA (obstructive sleep apnea)    USES CPAP    Pancreatitis ~ 1980   Peripheral vascular disease ([place])    Pre-diabetes    Prostate cancer ([place]) 04/2010   Past Surgical History:  Procedure  Laterality Date   ANTERIOR CERVICAL DECOMP/DISCECTOMY FUSION  05/26/2006   PLATE PLACED   BACK SURGERY     CARDIAC CATHETERIZATION  11/20/1999   CORONARY ARTERY BYPASS GRAFT  11/20/1999   SVG-RI1-RI2, SVG-OM, SVG-dRCA   CORONARY STENT INTERVENTION N/A 04/13/2019   Procedure: CORONARY STENT INTERVENTION;  Surgeon: Burnell Blanks, MD;  Location: [facility];  Service: Cardiovascular;  Laterality: N/A;   HERNIA REPAIR     INGUINAL HERNIA REPAIR Left 01/02/2019   Procedure: OPEN LEFT INGUINAL HERNIA REPAIR WITH MESH;  Surgeon: Johnathan Hausen, MD;  Location: [facility];  Service: General;  Laterality: Left;   KNEE ARTHROSCOPY Right 1992; 04/2008   LAPAROSCOPIC CHOLECYSTECTOMY  2007   LEFT HEART CATH AND CORS/GRAFTS ANGIOGRAPHY N/A 04/13/2019   Procedure: LEFT HEART CATH AND CORS/GRAFTS ANGIOGRAPHY;  Surgeon: Burnell Blanks, MD;  Location: 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   Procedure: LEFT TOTAL KNEE ARTHROPLASTY;  Surgeon: Melrose Nakayama, MD;  Location: [facility];  Service: Orthopedics;  Laterality: Left;   Social History   Socioeconomic History   Marital status: Married    Spouse name: Not  on file   Number of children: Not on file   Years of education: Not on file   Highest education level: Not on file  Occupational History   Occupation: retired    Fish farm manager: RETIRED  Tobacco Use   Smoking status: Former    Packs/day: 1.00    Years: 35.00    Pack years: 35.00    Types: Cigarettes    Quit date: 06/16/1992    Years since quitting: 28.2   Smokeless tobacco: Never  Vaping Use   Vaping Use: Never used  Substance and Sexual Activity   Alcohol use: Yes    Alcohol/week: 2.0 standard drinks    Types: 2 Shots of liquor per week   Drug use: No   Sexual activity: Not Currently     Partners: Female  Other Topics Concern   Not on file  Social History Narrative   Married 42 years in 2015. 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, movies and tv, staying active      Right handed   2 level home with steps he uses   Social Determinants of Radio broadcast assistant Strain: Low Risk    Difficulty of Paying Living Expenses: Not hard at all  Food Insecurity: No Food Insecurity   Worried About Charity fundraiser in the Last Year: Never true   Arboriculturist in the Last Year: Never true  Transportation Needs: No Transportation Needs   Lack of Transportation (Medical): No   Lack of Transportation (Non-Medical): No  Physical Activity: Inactive   Days of Exercise per Week: 0 days   Minutes of Exercise per Session: 0 min  Stress: No Stress Concern Present   Feeling of Stress : Only a little  Social Connections: Moderately Isolated   Frequency of Communication with Friends and Family: More than three times a week   Frequency of Social Gatherings with Friends and Family: Three times a week   Attends Religious Services: Never   Active Member of Clubs or Organizations: No   Attends Archivist Meetings: Never   Marital Status: Married   Allergies  Allergen Reactions   Benadryl [Diphenhydramine] Other (See Comments)    Pt told not to take because of bypass surgery   Bromfed Nausea And Vomiting   Clarithromycin Other (See Comments)     gastritis   Codeine Other (See Comments)    Trouble breathing   Doxycycline Hyclate Nausea And Vomiting   Lexapro [Escitalopram]     Dizziness   Modafinil Other (See Comments)     anxiety-nervousness   Oxycodone-Acetaminophen Itching   Promethazine Hcl Other (See Comments)     fainting   Quinolones Nausea Only    Cipro "felt real bad"   Telithromycin Nausea And Vomiting   Tramadol Nausea Only   Hydrocodone-Acetaminophen Rash   Statins Other  (See Comments)    Leg cramps and makes patient feel bad. Per pt tried multiple in years past   Sulfonamide Derivatives Rash   Family History  Problem Relation Age of Onset   Heart disease Mother    Hyperlipidemia Mother    Heart disease Father    Hyperlipidemia Father    Diabetes Brother      Current Outpatient Medications (Cardiovascular):    amLODipine (NORVASC) 5 MG tablet, Take 1 tablet (5 mg total) by mouth daily.   EPINEPHrine  0.3 mg/0.3 mL IJ SOAJ injection, Inject 0.3 mg into the muscle as needed for anaphylaxis. for allergic reaction   hydrochlorothiazide (HYDRODIURIL) 25 MG tablet, TAKE 1 TABLET BY MOUTH EVERY DAY (Patient taking differently: Take 25 mg by mouth daily.)   nitroGLYCERIN (NITROSTAT) 0.4 MG SL tablet, PLACE 1 TABLET UNDER THE TONGUE EVERY 5 (FIVE) MINUTES AS NEEDED. (Patient taking differently: Place 0.4 mg under the tongue every 5 (five) minutes as needed for chest pain.)   simvastatin (ZOCOR) 20 MG tablet, TAKE 1 TABLET BY MOUTH EVERYDAY AT BEDTIME (Patient taking differently: Take 20 mg by mouth at bedtime.)   valsartan (DIOVAN) 320 MG tablet, Take 1 tablet (320 mg total) by mouth daily.   ezetimibe (ZETIA) 10 MG tablet, Take 1 tablet (10 mg total) by mouth daily.  Current Outpatient Medications (Respiratory):    albuterol (PROAIR HFA) 108 (90 BASE) MCG/ACT inhaler, Inhale 1-2 puffs into the lungs every 6 (six) hours as needed for wheezing or shortness of breath.   FLOVENT HFA 110 MCG/ACT inhaler, Inhale 1 puff into the lungs 2 (two) times daily.    fluticasone (FLONASE) 50 MCG/ACT nasal spray, Place 1 spray into both nostrils 2 (two) times daily.    loratadine (CLARITIN) 10 MG tablet, Take 10 mg by mouth at bedtime.   Current Outpatient Medications (Analgesics):    acetaminophen (TYLENOL) 500 MG tablet, Take 1,000 mg by mouth every 6 (six) hours as needed for moderate pain or headache.   aspirin EC 81 MG tablet, Take 1 tablet (81 mg total) by mouth 2 (two)  times daily after a meal.   HYDROmorphone (DILAUDID) 2 MG tablet, TAKE 1 TABLETS EVERY 6-8 HOURS AS NEEDED FOR SEVERE POST OP PAIN  Current Outpatient Medications (Hematological):    Cyanocobalamin (B-12) 2500 MCG TABS, Take 1,250 mcg by mouth daily.   folic acid (FOLVITE) 545 MCG tablet, Take 800 mcg by mouth daily.   Current Outpatient Medications (Other):    busPIRone (BUSPAR) 5 MG tablet, Take 1 tablet (5 mg total) by mouth 2 (two) times daily as needed.   cephALEXin (KEFLEX) 500 MG capsule, Take 1 capsule (500 mg total) by mouth 4 (four) times daily.   cholecalciferol (VITAMIN D3) 25 MCG (1000 UNIT) tablet, Take 1,000 Units by mouth daily.   clotrimazole-betamethasone (LOTRISONE) cream, Apply 1 application topically See admin instructions. Twice daily every 4 days   COVID-19 mRNA Vac-TriS, Pfizer, (PFIZER-BIONT COVID-19 VAC-TRIS) SUSP injection, Inject into the muscle.   diazepam (VALIUM) 5 MG tablet, TAKE 1 TABLET (5 MG TOTAL) BY MOUTH AT BEDTIME AS NEEDED. (Patient taking differently: Take 5 mg by mouth at bedtime as needed (sleep).)   gabapentin (NEURONTIN) 300 MG capsule, Take 1 cap in AM, 1 cap at noon, 2 caps at bedtime (Patient taking differently: Take 300-600 mg by mouth See admin instructions. Take 300 mg in the morning, 300 mg at noon, and 600 mg at bedtime)   Glucos-Chond-Hyal Ac-Ca Fructo (MOVE FREE JOINT HEALTH ADVANCE PO), Take 1 tablet by mouth daily.   ketoconazole (NIZORAL) 2 % cream, Apply 1 application topically See admin instructions. Twice a day every other day   Miconazole Nitrate (LOTRIMIN AF DEODORANT POWDER) 2 % AERP, Apply 1 spray topically daily as needed (jock itch).   nystatin-triamcinolone ointment (MYCOLOG), Apply 1 application topically See admin instructions. Twice daily every 4 days   pantoprazole (PROTONIX) 40 MG tablet, TAKE 1 TABLET BY MOUTH EVERY DAY   polyethylene glycol powder (GLYCOLAX/MIRALAX) powder, Take 17 g by  mouth daily. (Patient taking  differently: Take 17 g by mouth in the morning and at bedtime.)   tiZANidine (ZANAFLEX) 4 MG tablet, Take 1 tablet (4 mg total) by mouth every 6 (six) hours as needed for muscle spasms.   Wheat Dextrin (BENEFIBER) POWD, Take 1 Dose by mouth daily. 1 dose = 2 teaspoons   Reviewed prior external information including notes and imaging from  primary care provider As well as notes that were available from care everywhere and other healthcare systems.  Past medical history, social, surgical and family history all reviewed in electronic medical record.  No pertanent information unless stated regarding to the chief complaint.   Review of Systems:  No headache, visual changes, nausea, vomiting, diarrhea, constipation, dizziness, abdominal pain, skin rash, fevers, chills, night sweats, weight loss, swollen lymph nodes,, chest pain, shortness of breath, mood changes. POSITIVE muscle aches, joint swelling, body aches  Objective  Blood pressure 110/70, pulse (!) 49, height 5\' 6"  (1.676 m), weight 197 lb (89.4 kg), SpO2 98 %.   General: No apparent distress alert and oriented x3 mood and affect normal, dressed appropriately.  HEENT: Pupils equal, extraocular movements intact  Respiratory: Patient's speak in full sentences and does not appear short of breath  Cardiovascular: No lower extremity edema, non tender, no erythema  Gait mild antalgic Left knee does have well-healed incision over the front of the knee at this time.  Patient does have 90 degrees of flexion.  Lacks last 5 degrees of extension.  Patient does have small effusion noted.  Very minorly warm to touch but no erythema of the knee noted.  Neurovascularly intact distally.  No calf pain with compression.  Limited muscular skeletal ultrasound was performed and interpreted by Hulan Saas, M  Limited ultrasound of patient's left knee shows patient does have an effusion noted with some mild synovitis noted.  Does have some mild soft tissue  inflammation noted as well with some mild increase in Doppler flow.  No specific findings of any infectious etiology for abscess formation. Impression: Knee effusion after knee replacement with some mild synovitis.   Impression and Recommendations:     The above documentation has been reviewed and is accurate and complete Lyndal Pulley, DO

## 2020-09-25 NOTE — Assessment & Plan Note (Signed)
Patient is 3 months out from the total knee replacement.  Patient does have an effusion noted of the knee joint on ultrasound.  Patient recently did have aspiration done of the knee.  Do not have the cultures of this.  We will see if we can get the final results.  Patient was initially on antibiotics when he did have a dehiscing of the wound 6 weeks ago.  Patient is not on antibiotics at this time.  Patient will follow up with me again after having laboratory work-up to rule out inflammatory markers and any loosening.  Discussed the potential for a bone scan but will hold at this time.  Follow-up with me again in 2 to 3 weeks.

## 2020-09-26 LAB — LACTATE DEHYDROGENASE: LDH: 130 U/L (ref 120–250)

## 2020-09-27 DIAGNOSIS — J3089 Other allergic rhinitis: Secondary | ICD-10-CM | POA: Diagnosis not present

## 2020-09-27 DIAGNOSIS — J3081 Allergic rhinitis due to animal (cat) (dog) hair and dander: Secondary | ICD-10-CM | POA: Diagnosis not present

## 2020-09-27 DIAGNOSIS — J301 Allergic rhinitis due to pollen: Secondary | ICD-10-CM | POA: Diagnosis not present

## 2020-10-02 ENCOUNTER — Encounter: Payer: Self-pay | Admitting: Family Medicine

## 2020-10-02 LAB — CHROMIUM AND COBALT, WB (MOM)
Chromium: <1 ng/mL (ref <3.0)
Cobalt: <1 ng/mL (ref <3.0)

## 2020-10-03 ENCOUNTER — Other Ambulatory Visit: Payer: Self-pay | Admitting: Family Medicine

## 2020-10-07 ENCOUNTER — Telehealth: Payer: Self-pay | Admitting: Cardiology

## 2020-10-07 NOTE — Telephone Encounter (Signed)
D/w pt he will call his PCP

## 2020-10-07 NOTE — Telephone Encounter (Signed)
Pt is calling to find out how much folic acid should he take

## 2020-10-08 ENCOUNTER — Telehealth: Payer: PPO

## 2020-10-08 ENCOUNTER — Encounter: Payer: Self-pay | Admitting: Family Medicine

## 2020-10-11 DIAGNOSIS — J301 Allergic rhinitis due to pollen: Secondary | ICD-10-CM | POA: Diagnosis not present

## 2020-10-11 DIAGNOSIS — J3081 Allergic rhinitis due to animal (cat) (dog) hair and dander: Secondary | ICD-10-CM | POA: Diagnosis not present

## 2020-10-15 ENCOUNTER — Ambulatory Visit: Payer: PPO | Admitting: Family Medicine

## 2020-10-16 DIAGNOSIS — Z96652 Presence of left artificial knee joint: Secondary | ICD-10-CM | POA: Diagnosis not present

## 2020-10-21 ENCOUNTER — Ambulatory Visit (INDEPENDENT_AMBULATORY_CARE_PROVIDER_SITE_OTHER): Payer: PPO | Admitting: Family Medicine

## 2020-10-21 ENCOUNTER — Encounter: Payer: Self-pay | Admitting: Family Medicine

## 2020-10-21 VITALS — BP 100/64 | HR 83 | Temp 97.4°F | Ht 66.0 in | Wt 197.8 lb

## 2020-10-21 DIAGNOSIS — R1010 Upper abdominal pain, unspecified: Secondary | ICD-10-CM

## 2020-10-21 DIAGNOSIS — I2510 Atherosclerotic heart disease of native coronary artery without angina pectoris: Secondary | ICD-10-CM

## 2020-10-21 DIAGNOSIS — I10 Essential (primary) hypertension: Secondary | ICD-10-CM | POA: Diagnosis not present

## 2020-10-21 DIAGNOSIS — E785 Hyperlipidemia, unspecified: Secondary | ICD-10-CM

## 2020-10-21 NOTE — Patient Instructions (Addendum)
Health Maintenance Due  Topic Date Due   INFLUENZA VACCINE - let us know if you get this outside of the office 10/21/2020   80 year old male with increasing upper abdominal pain after knee replacement surgery-may have been triggered with medication such as hydromorphone-has been improving in the last week with probiotic.  Does have history of cancer with no obvious recurrence-we discussed potential repeat imaging with CT abdomen pelvis-he would like to hold off for now since he is improving.  He is going to let me know if symptoms fail to continue to improve or if they worsen.  I would also like to know if bowel habits worsen such as if he develops diarrhea-could do stool cultures/studies at that point. Should also let me know if he has fever, blood in stool, dark black stool. Cologuard 02/21/2019 reassuring. EGD 2010- he is on PPI- could certainly do GI referral if not improving in next 1-2 weeks to consider repeat studies.    Recommended follow up: check in 2 weeks from now for physical

## 2020-10-21 NOTE — Progress Notes (Addendum)
Phone [phone] In person visit   Subjective:   Joshua [name] is a 80 y.o. year old very pleasant male patient who presents for/with See problem oriented charting Chief Complaint  Patient presents with   Abdominal Pain    Starting 06/18/2020 with knee surgery when taking medications.    This visit occurred during the SARS-CoV-2 public health emergency.  Safety protocols were in place, including screening questions prior to the visit, additional usage of staff PPE, and extensive cleaning of exam room while observing appropriate contact time as indicated for disinfecting solutions.   Past Medical History-  Patient Active Problem List   Diagnosis Date Noted   Coronary atherosclerosis 10/12/2006    Priority: High   Irritable bowel syndrome 10/12/2006    Priority: High   Overactive bladder 11/24/2019    Priority: Medium   Hyperglycemia 10/07/2017    Priority: Medium   Gout 11/20/2016    Priority: Medium   CKD (chronic kidney disease), stage III ([place]) 10/23/2013    Priority: Medium   Neuropathy ([place]) 08/21/2013    Priority: Medium   History of bladder cancer 04/01/2010    Priority: Medium   Essential hypertension 02/21/2007    Priority: Medium   Hyperlipidemia 10/12/2006    Priority: Medium   Depression, major, single episode, in partial remission ([place]) 10/12/2006    Priority: Medium   Asthma 10/12/2006    Priority: Medium   Aortic atherosclerosis ([place]) 05/05/2018    Priority: Low   DJD (degenerative joint disease)     Priority: Low   Arthritis     Priority: Low   Neck pain 02/08/2017    Priority: Low   Venous insufficiency 12/29/2016    Priority: Low   Ganglion cyst of tendon sheath of right hand 12/29/2016    Priority: Low   Avulsion fracture of metatarsal bone of right foot 11/25/2016    Priority: Low   Obesity, Class I, BMI 30-34.9 10/03/2015    Priority: Low   Allergic rhinitis 05/31/2014    Priority: Low   Fatigue 11/09/2013    Priority: Low    Dizziness 08/23/2013    Priority: Low   RLS (restless legs syndrome) 01/03/2013    Priority: Low   Chronic bilateral low back pain with left-sided sciatica 10/09/2011    Priority: Low   OSTEOARTHRITIS, LOWER LEG, LEFT 10/04/2009    Priority: Low   ANEMIA, IRON DEFICIENCY 11/06/2008    Priority: Low   TINNITUS, CHRONIC, 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    Priority: Low   OSA (obstructive sleep apnea) 11/02/2006    Priority: Low   GERD 10/12/2006    Priority: Low   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 ([place]) 09/25/2020   Primary osteoarthritis of left knee 06/18/2020   Pre-operative respiratory examination 06/05/2020   Coronary artery disease involving coronary bypass graft of native heart without angina pectoris 04/07/2020   Trigger thumb, left thumb 01/24/2020   Left lateral epicondylitis 09/05/2019   Degenerative arthritis of left knee 08/08/2019   Educated about COVID-19 virus infection 04/20/2019   Unstable angina (HCC)    Greater trochanteric bursitis of left hip 10/27/2018   Degenerative disc disease, lumbar 10/27/2018   Abdominal muscle strain, initial encounter 06/16/2018   Weakness of left leg 06/21/2017   Hereditary and idiopathic peripheral neuropathy 09/25/2014    Medications- reviewed and updated Current Outpatient Medications  Medication Sig Dispense Refill   acetaminophen (TYLENOL) 500 MG tablet Take 1,000 mg by mouth every 6 (six) hours as needed for  moderate pain or headache.     albuterol (PROAIR HFA) 108 (90 BASE) MCG/ACT inhaler Inhale 1-2 puffs into the lungs every 6 (six) hours as needed for wheezing or shortness of breath. 18 g 3   amLODipine (NORVASC) 5 MG tablet Take 1 tablet (5 mg total) by mouth daily. 90 tablet 3   aspirin EC 81 MG tablet Take 1 tablet (81 mg total) by mouth 2 (two) times daily after a meal. 30 tablet 11   cholecalciferol (VITAMIN D3) 25 MCG (1000 UNIT) tablet Take 1,000 Units by mouth daily.     clotrimazole-betamethasone (LOTRISONE) cream  Apply 1 application topically See admin instructions. Twice daily every 4 days     Cyanocobalamin (B-12) 2500 MCG TABS Take 1,250 mcg by mouth daily.     diazepam (VALIUM) 5 MG tablet TAKE 1 TABLET (5 MG TOTAL) BY MOUTH AT BEDTIME AS NEEDED. (Patient taking differently: Take 5 mg by mouth at bedtime as needed (sleep).) 30 tablet 1   EPINEPHrine 0.3 mg/0.3 mL IJ SOAJ injection Inject 0.3 mg into the muscle as needed for anaphylaxis. for allergic reaction     ezetimibe (ZETIA) 10 MG tablet Take 1 tablet (10 mg total) by mouth daily. 90 tablet 3   FLOVENT HFA 110 MCG/ACT inhaler Inhale 1 puff into the lungs 2 (two) times daily.   5   fluticasone (FLONASE) 50 MCG/ACT nasal spray Place 1 spray into both nostrils 2 (two) times daily.      folic acid (FOLVITE) [id number] MCG tablet Take 800 mcg by mouth daily.      gabapentin (NEURONTIN) 300 MG capsule Take 1 cap in AM, 1 cap at noon, 2 caps at bedtime (Patient taking differently: Take 300-600 mg by mouth See admin instructions. Take 300 mg in the morning, 300 mg at noon, and 600 mg at bedtime) 360 capsule 3   Glucos-Chond-Hyal Ac-Ca Fructo (MOVE FREE JOINT HEALTH ADVANCE PO) Take 1 tablet by mouth daily.     hydrochlorothiazide (HYDRODIURIL) 25 MG tablet TAKE 1 TABLET BY MOUTH EVERY DAY 90 tablet 2   ketoconazole (NIZORAL) 2 % cream Apply 1 application topically See admin instructions. Twice a day every other day     loratadine (CLARITIN) 10 MG tablet Take 10 mg by mouth at bedtime.      Miconazole Nitrate (LOTRIMIN AF DEODORANT POWDER) 2 % AERP Apply 1 spray topically daily as needed (jock itch).     nitroGLYCERIN (NITROSTAT) 0.4 MG SL tablet PLACE 1 TABLET UNDER THE TONGUE EVERY 5 (FIVE) MINUTES AS NEEDED. (Patient taking differently: Place 0.4 mg under the tongue every 5 (five) minutes as needed for chest pain.) 75 tablet 1   nystatin-triamcinolone ointment (MYCOLOG) Apply 1 application topically See admin instructions. Twice daily every 4 days      polyethylene glycol powder (GLYCOLAX/MIRALAX) powder Take 17 g by mouth daily. (Patient taking differently: Take 17 g by mouth in the morning and at bedtime.) 255 g 0   simvastatin (ZOCOR) 20 MG tablet TAKE 1 TABLET BY MOUTH EVERYDAY AT BEDTIME (Patient taking differently: Take 20 mg by mouth at bedtime.) 90 tablet 3   valsartan (DIOVAN) 320 MG tablet Take 1 tablet (320 mg total) by mouth daily. 90 tablet 3   Wheat Dextrin (BENEFIBER) POWD Take 1 Dose by mouth daily. 1 dose = 2 teaspoons     busPIRone (BUSPAR) 5 MG tablet Take 1 tablet (5 mg total) by mouth 2 (two) times daily as needed. (Patient not taking: Reported on 10/21/2020)  40 tablet 2   pantoprazole (PROTONIX) 40 MG tablet TAKE 1 TABLET BY MOUTH EVERY DAY (Patient not taking: Reported on 10/21/2020) 90 tablet 3   tiZANidine (ZANAFLEX) 4 MG tablet Take 1 tablet (4 mg total) by mouth every 6 (six) hours as needed for muscle spasms. (Patient not taking: Reported on 10/21/2020) 40 tablet 1   No current facility-administered medications for this visit.     Objective:  BP 100/64   Pulse 83   Temp (!) 97.4 F (36.3 C) (Temporal)   Ht '5\' 6"'$  (1.676 m)   Wt 197 lb 12.8 oz (89.7 kg)   SpO2 97%   BMI 31.93 kg/m  Gen: NAD, resting comfortably CV: RRR no murmurs rubs or gallops Lungs: CTAB no crackles, wheeze, rhonchi Abdomen: soft/nontender other than band like pain across upper abdomen/nondistended/normal bowel sounds. No rebound or guarding.  Ext: trace edema L>R Skin: warm, dry Neuro: antalgic gait with knee pain    Assessment and Plan   #social update-  Travelling to [place] with 2 friends this weekend. Guys he plays poker with   #Abdominal pain (across upper abdomen) S: Patient reports abdominal pain in upper abdomen (bad across LUQ, epigastric, RUQ) and starting after his knee surgery 06/18/2020 when having to take medicines for the surgery.  At baseline patient has very sensitive stomach-does not tolerate veggies/fish.  He was given  hydromorphone at that time-not taking at this time - taking tramadol now on sparing basis twice a week for pain. He is still on pantoprazole for acid reflux  Probiotic started a week ago- has had some mild improvement- no longer seeing movement across upper abdomen with peristalsis. Pain level 2 weeks ago perhaps 5/10, more recently 4/10- but movement has stopped and that is encouraging to him. Now having 3-4 BMs a day starting 2 weeks after surgery (before surgery 1-2 per day)- comes out rather explosive but is not diarrhea- more just soft/loose. No fever or chills with this. Has not bene constipated at any point. No melena or BRBPR. No fall or injury- other than really bad time where he splint open wound.   Patient also with history of prostate/bladder cancer-prostatectomy and bladder cancer cautery in 2012.  Patient had blood work with Dr. Tamala Julian of sports medicine on 09/25/2020-CBC and CMP largely reassuring.  CRP was normal.  Sedimentation rate mildly elevated but better than reading several months ago-encouraging and points away from malignancy A/P: 80 year old male with increasing upper abdominal pain after knee replacement surgery-may have been triggered with medication such as hydromorphone-has been improving in the last week with probiotic-could be functional abdominal pain.He does have history of sensitive abdomen in regards to certain foods.  But also does have history of cancer with no obvious recurrence-we discussed potential repeat imaging with CT abdomen pelvis-he would like to hold off for now since he is improving.  He is going to let me know if symptoms fail to continue to improve or if they worsen.  I would also like to know if bowel habits worsen such as if he develops diarrhea-could do stool cultures/studies at that point. Should also let me know if he has fever, blood in stool, dark black stool. Cologuard 02/21/2019 reassuring. EGD 2010- he is on PPI- could certainly do GI referral if not  improving in next 1-2 weeks to consider repeat studies.  Given improvement on probiotic doubt ulceration -offered labs for lipase as well- he declines for now   #CAD/hyperlipidemia-history of CABG x5- follows with Dr. Percival Spanish  and with Dr. Debara Pickett S: Fatigue on Repatha in 2021-restarted on simvastatin 20 mg with plan for Zetia 10 mg p.o. as well.[name]  He is compliant with aspirin and Plavix as well.   -Stent drug eluting placed 04/13/19 Dr. Angelena Form Lab Results  Component Value Date   CHOL 155 04/16/2020   HDL 41 04/16/2020   LDLCALC 84 04/16/2020   LDLDIRECT 115.0 06/04/2016   TRIG 176 (H) 04/16/2020   CHOLHDL 3.8 04/16/2020  A/P: CAD without worsening chest pain (sparing nitroglycerin- stable angina) and no SOB. HLD- hopefully controlled - has lipids soon with cardiology.     #Hypertension/CKD stage III S: Compliant with amlodipine 5 mg, hydrochlorothiazide 25 mg and losartan 100 mg -GFR has been stable.  On ARB in case proteinuric element  Has some balance issues with his knee but no dizziness BP Readings from Last 3 Encounters:  10/21/20 100/64  09/25/20 110/70  08/21/20 128/72   A/P: Stable. Continue current medications. If gets any symptoms from BP being lower such as new fatigue or lightheadedness with standing asked him to let me know- likely cut the hctz in half.   Recommended follow up: keep visit in auguust for cpe Future Appointments  Date Time Provider [place]  11/01/2020  9:30 AM Pixie Casino, MD [facility]  11/04/2020  9:20 AM [name], MD [facility]  01/08/2021  8:30 AM Cameron Sprang, MD [facility] None  05/15/2021  9:30 AM [facility] HEALTH COACH [facility]   Lab/Order associations:   ICD-10-CM   1. Upper abdominal pain  R10.10     2. Essential hypertension  I10     3. Hyperlipidemia, unspecified hyperlipidemia type  E78.5     4. Atherosclerosis of native coronary artery of native heart without angina pectoris  I25.10      Return  precautions advised.  [name], MD

## 2020-10-22 DIAGNOSIS — E785 Hyperlipidemia, unspecified: Secondary | ICD-10-CM | POA: Diagnosis not present

## 2020-10-22 LAB — LIPID PANEL
Chol/HDL Ratio: 3.2 ratio (ref 0.0–5.0)
Cholesterol, Total: 161 mg/dL (ref 100–199)
HDL: 50 mg/dL (ref >39)
LDL Chol Calc (NIH): 85 mg/dL (ref 0–99)
Triglycerides: 147 mg/dL (ref 0–149)
VLDL Cholesterol Cal: 26 mg/dL (ref 5–40)

## 2020-10-24 ENCOUNTER — Other Ambulatory Visit: Payer: Self-pay | Admitting: Family Medicine

## 2020-10-24 ENCOUNTER — Telehealth: Payer: Self-pay

## 2020-10-24 NOTE — Telephone Encounter (Signed)
  Encourage patient to contact the pharmacy for refills or they can request refills through [facility]:  10/21/20  NEXT APPOINTMENT DATE: 11/04/2020  MEDICATION: diazepam (VALIUM) 5 MG tablet  PHARMACY:  [id number] - [place], [place]

## 2020-10-25 DIAGNOSIS — J301 Allergic rhinitis due to pollen: Secondary | ICD-10-CM | POA: Diagnosis not present

## 2020-10-25 DIAGNOSIS — J3081 Allergic rhinitis due to animal (cat) (dog) hair and dander: Secondary | ICD-10-CM | POA: Diagnosis not present

## 2020-10-25 DIAGNOSIS — J3089 Other allergic rhinitis: Secondary | ICD-10-CM | POA: Diagnosis not present

## 2020-10-26 MED ORDER — DIAZEPAM 5 MG PO TABS: 5.0000 mg | ORAL_TABLET | Freq: Every evening | ORAL | 1 refills | Status: DC | PRN

## 2020-10-26 NOTE — Addendum Note (Signed)
Addended by: [name] on: 10/26/2020 11:22 AM   Modules accepted: Orders

## 2020-11-01 ENCOUNTER — Other Ambulatory Visit: Payer: Self-pay

## 2020-11-01 ENCOUNTER — Ambulatory Visit (HOSPITAL_BASED_OUTPATIENT_CLINIC_OR_DEPARTMENT_OTHER): Payer: PPO | Admitting: Internal Medicine

## 2020-11-01 VITALS — BP 106/48 | HR 71 | Ht 66.0 in | Wt 192.0 lb

## 2020-11-01 DIAGNOSIS — I25118 Atherosclerotic heart disease of native coronary artery with other forms of angina pectoris: Secondary | ICD-10-CM

## 2020-11-01 DIAGNOSIS — T466X5D Adverse effect of antihyperlipidemic and antiarteriosclerotic drugs, subsequent encounter: Secondary | ICD-10-CM

## 2020-11-01 DIAGNOSIS — M791 Myalgia, unspecified site: Secondary | ICD-10-CM

## 2020-11-01 DIAGNOSIS — E785 Hyperlipidemia, unspecified: Secondary | ICD-10-CM

## 2020-11-01 DIAGNOSIS — T466X5A Adverse effect of antihyperlipidemic and antiarteriosclerotic drugs, initial encounter: Secondary | ICD-10-CM

## 2020-11-01 NOTE — Progress Notes (Signed)
Phone [phone] In person visit   Subjective:   Joshua [name] is a 80 y.o. year old very pleasant male patient who presents for/with See problem oriented charting Chief Complaint  Patient presents with   Hypertension   Hyperlipidemia   This visit occurred during the SARS-CoV-2 public health emergency.  Safety protocols were in place, including screening questions prior to the visit, additional usage of staff PPE, and extensive cleaning of exam room while observing appropriate contact time as indicated for disinfecting solutions.   Past Medical History-  Patient Active Problem List   Diagnosis Date Noted   Coronary atherosclerosis 10/12/2006    Priority: High   Irritable bowel syndrome 10/12/2006    Priority: High   Overactive bladder 11/24/2019    Priority: Medium   Hyperglycemia 10/07/2017    Priority: Medium   Gout 11/20/2016    Priority: Medium   CKD (chronic kidney disease), stage III ([place]) 10/23/2013    Priority: Medium   Neuropathy ([place]) 08/21/2013    Priority: Medium   History of bladder cancer 04/01/2010    Priority: Medium   Essential hypertension 02/21/2007    Priority: Medium   Hyperlipidemia 10/12/2006    Priority: Medium   Depression, major, single episode, in partial remission ([place]) 10/12/2006    Priority: Medium   Asthma 10/12/2006    Priority: Medium   Aortic atherosclerosis ([place]) 05/05/2018    Priority: Low   DJD (degenerative joint disease)     Priority: Low   Arthritis     Priority: Low   Neck pain 02/08/2017    Priority: Low   Venous insufficiency 12/29/2016    Priority: Low   Ganglion cyst of tendon sheath of right hand 12/29/2016    Priority: Low   Avulsion fracture of metatarsal bone of right foot 11/25/2016    Priority: Low   Obesity, Class I, BMI 30-34.9 10/03/2015    Priority: Low   Allergic rhinitis 05/31/2014    Priority: Low   Fatigue 11/09/2013    Priority: Low   Dizziness 08/23/2013    Priority: Low   RLS  (restless legs syndrome) 01/03/2013    Priority: Low   Chronic bilateral low back pain with left-sided sciatica 10/09/2011    Priority: Low   OSTEOARTHRITIS, LOWER LEG, LEFT 10/04/2009    Priority: Low   ANEMIA, IRON DEFICIENCY 11/06/2008    Priority: Low   TINNITUS, CHRONIC, 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    Priority: Low   OSA (obstructive sleep apnea) 11/02/2006    Priority: Low   GERD 10/12/2006    Priority: Low   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 ([place]) 09/25/2020   Primary osteoarthritis of left knee 06/18/2020   Pre-operative respiratory examination 06/05/2020   Coronary artery disease involving coronary bypass graft of native heart without angina pectoris 04/07/2020   Trigger thumb, left thumb 01/24/2020   Left lateral epicondylitis 09/05/2019   Degenerative arthritis of left knee 08/08/2019   Educated about COVID-19 virus infection 04/20/2019   Unstable angina (HCC)    Greater trochanteric bursitis of left hip 10/27/2018   Degenerative disc disease, lumbar 10/27/2018   Abdominal muscle strain, initial encounter 06/16/2018   Weakness of left leg 06/21/2017   Hereditary and idiopathic peripheral neuropathy 09/25/2014    Medications- reviewed and updated Current Outpatient Medications  Medication Sig Dispense Refill   acetaminophen (TYLENOL) 500 MG tablet Take 1,000 mg by mouth every 6 (six) hours as needed for moderate pain or headache.     albuterol (  PROAIR HFA) 108 (90 BASE) MCG/ACT inhaler Inhale 1-2 puffs into the lungs every 6 (six) hours as needed for wheezing or shortness of breath. 18 g 3   amLODipine (NORVASC) 5 MG tablet Take 1 tablet (5 mg total) by mouth daily. 90 tablet 3   aspirin EC 81 MG tablet Take 1 tablet (81 mg total) by mouth 2 (two) times daily after a meal. 30 tablet 11   cholecalciferol (VITAMIN D3) 25 MCG (1000 UNIT) tablet Take 1,000 Units by mouth daily.     clotrimazole-betamethasone (LOTRISONE) cream Apply 1 application topically See admin  instructions. Twice daily every 4 days     Cyanocobalamin (B-12) 2500 MCG TABS Take 1,250 mcg by mouth daily.     diazepam (VALIUM) 5 MG tablet Take 1 tablet (5 mg total) by mouth at bedtime as needed (sleep). 30 tablet 1   EPINEPHrine 0.3 mg/0.3 mL IJ SOAJ injection Inject 0.3 mg into the muscle as needed for anaphylaxis. for allergic reaction     ezetimibe (ZETIA) 10 MG tablet Take 1 tablet (10 mg total) by mouth daily. 90 tablet 3   FLOVENT HFA 110 MCG/ACT inhaler Inhale 1 puff into the lungs 2 (two) times daily.   5   fluticasone (FLONASE) 50 MCG/ACT nasal spray Place 1 spray into both nostrils 2 (two) times daily.      folic acid (FOLVITE) [id number] MCG tablet Take 800 mcg by mouth daily.      gabapentin (NEURONTIN) 300 MG capsule Take 1 cap in AM, 1 cap at noon, 2 caps at bedtime (Patient taking differently: Take 300-600 mg by mouth See admin instructions. Take 300 mg in the morning, 300 mg at noon, and 600 mg at bedtime) 360 capsule 3   Glucos-Chond-Hyal Ac-Ca Fructo (MOVE FREE JOINT HEALTH ADVANCE PO) Take 1 tablet by mouth daily.     hydrochlorothiazide (HYDRODIURIL) 25 MG tablet TAKE 1 TABLET BY MOUTH EVERY DAY 90 tablet 2   ketoconazole (NIZORAL) 2 % cream Apply 1 application topically See admin instructions. Twice a day every other day     loratadine (CLARITIN) 10 MG tablet Take 10 mg by mouth at bedtime.      Miconazole Nitrate (LOTRIMIN AF DEODORANT POWDER) 2 % AERP Apply 1 spray topically daily as needed (jock itch).     nitroGLYCERIN (NITROSTAT) 0.4 MG SL tablet PLACE 1 TABLET UNDER THE TONGUE EVERY 5 (FIVE) MINUTES AS NEEDED. (Patient taking differently: Place 0.4 mg under the tongue every 5 (five) minutes as needed for chest pain.) 75 tablet 1   pantoprazole (PROTONIX) 40 MG tablet TAKE 1 TABLET BY MOUTH EVERY DAY 90 tablet 3   polyethylene glycol powder (GLYCOLAX/MIRALAX) powder Take 17 g by mouth daily. 255 g 0   simvastatin (ZOCOR) 20 MG tablet TAKE 1 TABLET BY MOUTH EVERYDAY AT  BEDTIME (Patient taking differently: Take 20 mg by mouth at bedtime.) 90 tablet 3   valsartan (DIOVAN) 320 MG tablet Take 1 tablet (320 mg total) by mouth daily. 90 tablet 3   Wheat Dextrin (BENEFIBER) POWD Take 1 Dose by mouth daily. 1 dose = 2 teaspoons     nystatin-triamcinolone ointment (MYCOLOG) Apply 1 application topically See admin instructions. Twice daily every 4 days (Patient not taking: Reported on 11/04/2020)     tiZANidine (ZANAFLEX) 4 MG tablet Take 1 tablet (4 mg total) by mouth every 6 (six) hours as needed for muscle spasms. (Patient not taking: Reported on 11/04/2020) 40 tablet 1   No current facility-administered medications  for this visit.     Objective:  BP 119/67   Pulse 74   Temp 97.8 F (36.6 C) (Temporal)   Ht '5\' 6"'$  (1.676 m)   Wt 199 lb 3.2 oz (90.4 kg)   SpO2 97%   BMI 32.15 kg/m  Gen: NAD, resting comfortably CV: RRR no murmurs rubs or gallops Lungs: CTAB no crackles, wheeze, rhonchi Abdomen: soft/nontender other than moderate epigastric pain/nondistended/normal bowel sounds. No rebound or guarding.  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   # upper abdominal pain S:a/p from 10/21/20 visit "80 year old male with increasing upper abdominal pain after knee replacement surgery-may have been triggered with medication such as hydromorphone-has been improving in the last week with probiotic-could be functional abdominal pain.He does have history of sensitive abdomen in regards to certain foods.  But also does have history of cancer with no obvious recurrence-we discussed potential repeat imaging with CT abdomen pelvis-he would like to hold off for now since he is improving.  He is going to let me know if symptoms fail to continue to improve or if they worsen.  I would also like to know if bowel habits worsen such as  if he develops diarrhea-could do stool cultures/studies at that point. Should also let me know if he has fever, blood in stool, dark black stool. Cologuard 02/21/2019 reassuring. EGD 2010- he is on PPI- could certainly do GI referral if not improving in next 1-2 weeks to consider repeat studies.  Given improvement on probiotic doubt ulceration -offered labs for lipase as well- he declines for now"   Today patient reports worsened and then improved. Did not make further improvement from last visit in regards to pain- but rumbling sounds have improved. S/p cholecystectomy A/P: epigastric and upper abdominal pain since march- discussed doing lipase or referral to GI vs. Referral for ct abd/pelvis. Doesn't have gallbladder so RUQ [place] not as helpful. Could be functional but want to rule out malignancy. Could refer to GI if persistent (he prefers this as second step)   #CAD/hyperlipidemia-history of CABG x5- follows with Dr. Percival Spanish S: Fatigue on Repatha in 2021-restarted on simvastatin 20 mg  daily  Zetia 10 mg daily as well.[name] He is compliant with aspirin 81 mg daily. Slight chest pain at times- no recent increase- sparing nitroglycerin one a week, no shortness of breath.   -Stent drug eluting placed 04/13/19 Dr. Angelena Form  - Last visit with Dr.Hilty on 02/22, his total cholesterol was 155, HDL 41, LDL 84 (decreased from 134) and triglycerides 176. His target LDL was less than 70  (so slightly above goal)and he was trying to be near that. He couldn't tolerate the PCSK9 inhibitors, so the discussion was to take inclisiran. The cost may had been an issue however patient was enrolled in the [employer] trial and he might have been a candidate for that.  Has developed some upper thigh pain and wonders if could be related to statin Lab Results  Component Value Date   CHOL 161 10/22/2020   HDL 50 10/22/2020   LDLCALC 85 10/22/2020   LDLDIRECT 115.0 06/04/2016   TRIG 147 10/22/2020   CHOLHDL 3.2 10/22/2020   A/P:  CAD stable- continue current meds except for - for lipids- he will trial simvastatin half tablet to see if that helps with myalgias  #Hypertension/CKD stage  III S: Compliant with amlodipine 5 mg daily, hctz 25 mg daily, valsaratan 320 mg daily  -GFR has been stable- last check 09/25/20 just above 60 actually. On ARB in case proteinuric element -monitor BP at home BP Readings from Last 3 Encounters:  11/04/20 119/67  11/01/20 (!) 106/48  10/21/20 100/64  A/P: Blood pressure is overcontrolled over last few readings.  Decrease hydrochlorothiazide to 12.5 mg and update me in 2 weeks with how your blood pressures are doing by MyChart. If still running low- may take him off- also had mild hyponatremia. Do not want to overtreat with CKD as could strain kidneys  #Depression, major, single episode-remains in partial remission S: Patient has not tolerated medication in the past-feels too tired and sleepy on SSRIs. Later had dizziness on Lexapro-discontinued -Had declined counseling and continued to decline counseling-had done this in the past and only somewhat helpful but had to find the right fit -buspirone 5 mg twice as needed - stopped after 1 dose- didn't eem to help Depression screen [facility] 2/9 11/04/2020 08/21/2020 07/29/2020  Decreased Interest 0 3 0  Down, Depressed, Hopeless 1 1 0  PHQ - 2 Score 1 4 0  Altered sleeping 0 0 0  Tired, decreased energy 3 3 0  Change in appetite 0 3 3  Feeling bad or failure about yourself  0 0 0  Trouble concentrating 0 0 0  Moving slowly or fidgety/restless 0 0 0  Suicidal thoughts 0 0 0  PHQ-9 Score '4 10 3  '$ Difficult doing work/chores Not difficult at all Very difficult Not difficult at all  Some recent data might be hidden   A/P: phq9 reasonably well controlled/full resmission- continue current meds  # Neuropathy- followed with [name] S: Compliant with zanaflex 4 mg as needed very sparingly,  gabapentin '300mg'$ - 1 pill in morning and noon - twice at night .  Burning/needle pain in the feet . Worse as it gets cooler. Wooly socks help A/P: largely stable- continue to monitor   #Hyperglycemia history of mildly elevated A1c. Prediabetes range. Lab Results  Component Value Date   HGBA1C 6.2 (H) 06/11/2020   HGBA1C 5.7 (H) 02/23/2020   HGBA1C 5.6 06/06/2019   A/P: we will plan on checking next visit  #Senile purpura-easy bruising and bleeding on aspirin. Stable continue to monitor  #Aortic atherosclerosis-Noted on CT 2016. Continue risk factor modification  #B12 deficiency-on oral B12 daily- he prefers daily Lab Results  Component Value Date   VITAMINB12 876 02/23/2020  A/P: recheck next visit  Recommended follow up: Return in about 3 months (around 02/04/2021) for follow-up or sooner as needed. Future Appointments  Date Time Provider [place]  01/08/2021  8:30 AM Cameron Sprang, MD [facility] None  05/15/2021  9:30 AM [facility] HEALTH COACH [facility]   Lab/Order associations:   ICD-10-CM   1. Essential hypertension  I10     2. Stage 3 chronic kidney disease, unspecified whether stage 3a or 3b CKD (HCC)  N18.30     3. Major depressive disorder with single episode, in full remission ([place])  F32.5     4. Hyperlipidemia, unspecified hyperlipidemia type  E78.5     5. Atherosclerosis of native coronary artery of native heart without angina pectoris  I25.10     6. Neuropathy ([place])  G62.9     7. Coronary artery disease involving coronary bypass graft of native heart without angina pectoris  I25.810     8. Dyslipidemia, goal LDL below 70  E78.5     9. Epigastric abdominal pain  R10.13 CT Abdomen Pelvis W Contrast    10. Hyperglycemia  R73.9      I,Jada Bradford,acting as a scribe for [name], MD.,have documented all relevant documentation on the behalf of [name], MD,as directed by  [name], MD while in the presence of [name], MD.  I, [name], MD, have reviewed all documentation for this  visit. The documentation on 11/04/20 for the exam, diagnosis, procedures, and orders are all accurate and complete.   Return precautions advised.  [name], MD

## 2020-11-01 NOTE — Patient Instructions (Signed)
Medication Instructions:  Your physician recommends that you continue on your current medications as directed. Please refer to the Current Medication list given to you today.  *If you need a refill on your cardiac medications before your next appointment, please call your pharmacy*   Lab Work: FASTING lab work in about 1 year -- complete before your next appointment with Dr. Debara Pickett   If you have labs (blood work) drawn today and your tests are completely normal, you will receive your results only by: [place] (if you have MyChart) OR A paper copy in the mail If you have any lab test that is abnormal or we need to change your treatment, we will call you to review the results.   Testing/Procedures: NONE   Follow-Up: At [facility], you and your health needs are our priority.  As part of our continuing mission to provide you with exceptional heart care, we have created designated Provider Care Teams.  These Care Teams include your primary Cardiologist (physician) and Advanced Practice Providers (APPs -  Physician Assistants and Nurse Practitioners) who all work together to provide you with the care you need, when you need it.  We recommend signing up for the patient portal called "MyChart".  Sign up information is provided on this After Visit Summary.  MyChart is used to connect with patients for Virtual Visits (Telemedicine).  Patients are able to view lab/test results, encounter notes, upcoming appointments, etc.  Non-urgent messages can be sent to your provider as well.   To learn more about what you can do with MyChart, go to [email or web address].    Your next appointment:   12 month(s) - lipid clinic  The format for your next appointment:   In Person  Provider:   K. Mali Hilty, MD   Other Instructions

## 2020-11-01 NOTE — Progress Notes (Signed)
] LIPID CLINIC NOTE  Chief Complaint:  Follow-up  Primary Care Physician: [name], MD  Primary Cardiologist:  [name], MD  HPI:  Joshua [name] is a 80 y.o. male with a history of 5 vessel bypass more than 20 years ago and recent symptoms of unstable angina with PCI to the origin to the proximal graft lesion of the circumflex in January 2021.  He also has a history of dyslipidemia, hypertension, obstructive sleep apnea, prostate cancer and bladder cancer in the past.  He has been intolerant to numerous statins and had been maintained on Repatha with significant reduction in his lipids with LDL particularly into the 80s in the past however noted over the 2 years that he was taking the medication that he had side effects which initially lasted for 24 to 48 hours then up to 72 to 96 hours after each injection.  He said it was getting to the point where he felt it was intolerable and then was advised to discontinue the medicine.  Currently on simvastatin 20 mg daily which she seems to be able to tolerate, however her lipid profile 2 months ago showed total cholesterol 215, triglycerides 240, HDL 38 and LDL 134.  He remains still well above target LDL less than 70.  This visit today discussed options to see if we can get him to target.  04/24/2020   [name] returns today for follow-up.  He is actually doing very well with recent changes in his lipid management.  He is tolerating addition of ezetimibe.  Total cholesterol now is 155, HDL 41, LDL 84 (decreased from 134) and triglycerides 176.  His target LDL is less than 70 and he is nearing that.  We discussed other possible options.  He says he is looking forward to becoming more active in the near term after having some work possibly done on his orthopedic issues.  Hopefully this will help improve his numbers further.  Since he cannot tolerate the antibody PCSK9 inhibitors, we also discussed the possibility of inclisiran.  This is  now FDA approved but has not yet made it into patients in our area.  Cost may be an issue however we are still enrolling patients in the [place] trial and he may be a candidate for that.  11/01/2020  [name] continues to do well.  His lipids have been fairly stable if not slightly improved.  Total cholesterol now 161, HDL 50, triglycerides 147 and LDL 85.  He is on simvastatin and ezetimibe.  He cannot tolerate other statins and had an antibody reaction to the PCSK9 inhibitors causing immune/flulike symptoms.  I suspect he will not likely tolerate Praluent for similar mechanism.  Inclisiran would likely be too costly at this point and were having difficulty getting into patients.  His options are otherwise limited for further reduction to target LDL less than 70.  He has been quite stable.  He continues to have problems with his knee which limits his activity.  PMHx:  Past Medical History:  Diagnosis Date   Acute medial meniscal tear    Anemia 2015   Arthritis    "middle finger right hand; right knee; neck" (02/06/2014)   Asthma    seasonal   Bladder cancer ([place]) 04/2010   "cauterized during prostate OR"   CAD (coronary artery disease)    CABG 2001   Diverticulosis    DJD (degenerative joint disease)    BACK   GERD (gastroesophageal reflux disease)  H/O inguinal hernia repair 12/2018   History of gout    Hyperlipidemia    Hypertension    IBS (irritable bowel syndrome)    Obesity    OSA (obstructive sleep apnea)    USES CPAP    Pancreatitis ~ 1980   Peripheral vascular disease ([place])    Pre-diabetes    Prostate cancer ([name]) 04/2010    Past Surgical History:  Procedure Laterality Date   ANTERIOR CERVICAL DECOMP/DISCECTOMY FUSION  05/26/2006   PLATE PLACED   BACK SURGERY     CARDIAC CATHETERIZATION  11/20/1999   CORONARY ARTERY BYPASS GRAFT  11/20/1999   SVG-RI1-RI2, SVG-OM, SVG-dRCA   CORONARY STENT INTERVENTION N/A 04/13/2019   Procedure: CORONARY STENT INTERVENTION;   Surgeon: Burnell Blanks, MD;  Location: [place] [facility];  Service: Cardiovascular;  Laterality: N/A;   HERNIA REPAIR     INGUINAL HERNIA REPAIR Left 01/02/2019   Procedure: OPEN LEFT INGUINAL HERNIA REPAIR WITH MESH;  Surgeon: Johnathan Hausen, MD;  Location: [facility];  Service: General;  Laterality: Left;   KNEE ARTHROSCOPY Right 1992; 04/2008   LAPAROSCOPIC CHOLECYSTECTOMY  2007   LEFT HEART CATH AND CORS/GRAFTS ANGIOGRAPHY N/A 04/13/2019   Procedure: LEFT HEART CATH AND CORS/GRAFTS ANGIOGRAPHY;  Surgeon: Burnell Blanks, MD;  Location: [place] [facility];  Service: Cardiovascular;  Laterality: N/A;   NASAL SEPTUM SURGERY Right 2007   ROBOT ASSISTED LAPAROSCOPIC RADICAL PROSTATECTOMY  04/2010   THYROIDECTOMY, PARTIAL  1980   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   Procedure: LEFT TOTAL KNEE ARTHROPLASTY;  Surgeon: Melrose Nakayama, MD;  Location: [facility];  Service: Orthopedics;  Laterality: Left;    FAMHx:  Family History  Problem Relation Age of Onset   Heart disease Mother    Hyperlipidemia Mother    Heart disease Father    Hyperlipidemia Father    Diabetes Brother     SOCHx:   reports that he quit smoking about 28 years ago. His smoking use included cigarettes. He has a 35.00 pack-year smoking history. He has never used smokeless tobacco. He reports current alcohol use of about 2.0 standard drinks per week. He reports that he does not use drugs.  ALLERGIES:  Allergies  Allergen Reactions   Benadryl [Diphenhydramine] Other (See Comments)    Pt told not to take because of bypass surgery   Bromfed Nausea And Vomiting   Clarithromycin Other (See Comments)     gastritis   Codeine Other (See Comments)    Trouble breathing   Doxycycline Hyclate Nausea And Vomiting   Lexapro [Escitalopram]     Dizziness   Modafinil Other (See Comments)     anxiety-nervousness   Oxycodone-Acetaminophen Itching   Promethazine Hcl Other (See Comments)     fainting   Quinolones  Nausea Only    Cipro "felt real bad"   Telithromycin Nausea And Vomiting   Tramadol Nausea Only   Hydrocodone-Acetaminophen Rash   Statins Other (See Comments)    Leg cramps and makes patient feel bad. Per pt tried multiple in years past   Sulfonamide Derivatives Rash    ROS: Pertinent items noted in HPI and remainder of comprehensive ROS otherwise negative.  HOME MEDS: Current Outpatient Medications on File Prior to Visit  Medication Sig Dispense Refill   acetaminophen (TYLENOL) 500 MG tablet Take 1,000 mg by mouth every 6 (six) hours as needed for moderate pain or headache.     albuterol (PROAIR HFA) 108 (90 BASE) MCG/ACT inhaler Inhale 1-2 puffs into the  lungs every 6 (six) hours as needed for wheezing or shortness of breath. 18 g 3   amLODipine (NORVASC) 5 MG tablet Take 1 tablet (5 mg total) by mouth daily. 90 tablet 3   aspirin EC 81 MG tablet Take 1 tablet (81 mg total) by mouth 2 (two) times daily after a meal. 30 tablet 11   cholecalciferol (VITAMIN D3) 25 MCG (1000 UNIT) tablet Take 1,000 Units by mouth daily.     clotrimazole-betamethasone (LOTRISONE) cream Apply 1 application topically See admin instructions. Twice daily every 4 days     Cyanocobalamin (B-12) 2500 MCG TABS Take 1,250 mcg by mouth daily.     diazepam (VALIUM) 5 MG tablet Take 1 tablet (5 mg total) by mouth at bedtime as needed (sleep). 30 tablet 1   EPINEPHrine 0.3 mg/0.3 mL IJ SOAJ injection Inject 0.3 mg into the muscle as needed for anaphylaxis. for allergic reaction     FLOVENT HFA 110 MCG/ACT inhaler Inhale 1 puff into the lungs 2 (two) times daily.   5   fluticasone (FLONASE) 50 MCG/ACT nasal spray Place 1 spray into both nostrils 2 (two) times daily.      folic acid (FOLVITE) Q000111Q MCG tablet Take 800 mcg by mouth daily.      gabapentin (NEURONTIN) 300 MG capsule Take 1 cap in AM, 1 cap at noon, 2 caps at bedtime (Patient taking differently: Take 300-600 mg by mouth See admin instructions. Take 300 mg in  the morning, 300 mg at noon, and 600 mg at bedtime) 360 capsule 3   Glucos-Chond-Hyal Ac-Ca Fructo (MOVE FREE JOINT HEALTH ADVANCE PO) Take 1 tablet by mouth daily.     hydrochlorothiazide (HYDRODIURIL) 25 MG tablet TAKE 1 TABLET BY MOUTH EVERY DAY 90 tablet 2   ketoconazole (NIZORAL) 2 % cream Apply 1 application topically See admin instructions. Twice a day every other day     loratadine (CLARITIN) 10 MG tablet Take 10 mg by mouth at bedtime.      Miconazole Nitrate (LOTRIMIN AF DEODORANT POWDER) 2 % AERP Apply 1 spray topically daily as needed (jock itch).     nitroGLYCERIN (NITROSTAT) 0.4 MG SL tablet PLACE 1 TABLET UNDER THE TONGUE EVERY 5 (FIVE) MINUTES AS NEEDED. (Patient taking differently: Place 0.4 mg under the tongue every 5 (five) minutes as needed for chest pain.) 75 tablet 1   nystatin-triamcinolone ointment (MYCOLOG) Apply 1 application topically See admin instructions. Twice daily every 4 days     pantoprazole (PROTONIX) 40 MG tablet TAKE 1 TABLET BY MOUTH EVERY DAY 90 tablet 3   polyethylene glycol powder (GLYCOLAX/MIRALAX) powder Take 17 g by mouth daily. 255 g 0   simvastatin (ZOCOR) 20 MG tablet TAKE 1 TABLET BY MOUTH EVERYDAY AT BEDTIME (Patient taking differently: Take 20 mg by mouth at bedtime.) 90 tablet 3   tiZANidine (ZANAFLEX) 4 MG tablet Take 1 tablet (4 mg total) by mouth every 6 (six) hours as needed for muscle spasms. 40 tablet 1   valsartan (DIOVAN) 320 MG tablet Take 1 tablet (320 mg total) by mouth daily. 90 tablet 3   Wheat Dextrin (BENEFIBER) POWD Take 1 Dose by mouth daily. 1 dose = 2 teaspoons     busPIRone (BUSPAR) 5 MG tablet Take 1 tablet (5 mg total) by mouth 2 (two) times daily as needed. (Patient not taking: No sig reported) 40 tablet 2   ezetimibe (ZETIA) 10 MG tablet Take 1 tablet (10 mg total) by mouth daily. 90 tablet 3  No current facility-administered medications on file prior to visit.    LABS/IMAGING: No results found. However, due to the size  of the patient record, not all encounters were searched. Please check Results Review for a complete set of results. No results found.  LIPID PANEL:    Component Value Date/Time   CHOL 161 10/22/2020 0835   TRIG 147 10/22/2020 0835   HDL 50 10/22/2020 0835   CHOLHDL 3.2 10/22/2020 0835   CHOLHDL 3.5 02/23/2020 0933   VLDL 31.6 11/03/2018 0806   LDLCALC 85 10/22/2020 0835   LDLCALC 84 02/23/2020 0933   LDLDIRECT 115.0 06/04/2016 0919    WEIGHTS: Wt Readings from Last 3 Encounters:  11/01/20 192 lb (87.1 kg)  10/21/20 197 lb 12.8 oz (89.7 kg)  09/25/20 197 lb (89.4 kg)    VITALS: BP (!) 106/48   Pulse 71   Ht '5\' 6"'$  (1.676 m)   Wt 192 lb (87.1 kg)   SpO2 99%   BMI 30.99 kg/m   EXAM: Deferred  EKG: Deferred  ASSESSMENT: Mixed dyslipidemia, goal LDL less than 70 Coronary artery disease status post 5 vessel CABG (2000) Recent unstable angina and PCI January 2021 Hypertension Obstructive sleep apnea  PLAN: 1.   Mr. Jardine has been reasonably stable with regards to his lipids on simvastatin and ezetimibe.  Although he remains above goal LDL less than 70 is done very well.  His intolerances to medications sleep few options.  We could possibly consider bempedoic acid however he wished to avoid additional medications at this time.  I think he would likely have side effects with Praluent that would be similar to [place].  Inclisiran may not give him those symptoms, however cost and other issues make it unreasonable at this point for him.  We will plan to continue working with diet and exercise.  Repeat lipids in about a month.  He was concerned about lower blood pressure today, notably low systolic and diastolic below 50.  He has lost weight and likely will need to have a reduction in his blood pressure medications, either the thiazide or amlodipine.  He has follow-up with his PCP on Monday and wishes to speak with him about that.  Follow-up annually or sooner as  necessary.  Pixie Casino, MD, [facility], [place] Director of the Advanced Lipid Disorders &  Cardiovascular Risk Reduction Clinic Diplomate of the American Board of Clinical Lipidology Attending Cardiologist  Direct Dial: [phone]  Fax: [phone]  Website:  [email or web address] 11/01/2020, 9:47 AM

## 2020-11-04 ENCOUNTER — Telehealth: Payer: Self-pay | Admitting: Pharmacist

## 2020-11-04 ENCOUNTER — Ambulatory Visit (INDEPENDENT_AMBULATORY_CARE_PROVIDER_SITE_OTHER): Payer: PPO | Admitting: Family Medicine

## 2020-11-04 ENCOUNTER — Encounter: Payer: Self-pay | Admitting: Family Medicine

## 2020-11-04 ENCOUNTER — Other Ambulatory Visit: Payer: Self-pay

## 2020-11-04 VITALS — BP 119/67 | HR 74 | Temp 97.8°F | Ht 66.0 in | Wt 199.2 lb

## 2020-11-04 DIAGNOSIS — R739 Hyperglycemia, unspecified: Secondary | ICD-10-CM | POA: Diagnosis not present

## 2020-11-04 DIAGNOSIS — I25810 Atherosclerosis of coronary artery bypass graft(s) without angina pectoris: Secondary | ICD-10-CM | POA: Diagnosis not present

## 2020-11-04 DIAGNOSIS — I10 Essential (primary) hypertension: Secondary | ICD-10-CM | POA: Diagnosis not present

## 2020-11-04 DIAGNOSIS — I2510 Atherosclerotic heart disease of native coronary artery without angina pectoris: Secondary | ICD-10-CM | POA: Diagnosis not present

## 2020-11-04 DIAGNOSIS — E785 Hyperlipidemia, unspecified: Secondary | ICD-10-CM

## 2020-11-04 DIAGNOSIS — R1013 Epigastric pain: Secondary | ICD-10-CM

## 2020-11-04 DIAGNOSIS — N183 Chronic kidney disease, stage 3 unspecified: Secondary | ICD-10-CM | POA: Diagnosis not present

## 2020-11-04 DIAGNOSIS — F325 Major depressive disorder, single episode, in full remission: Secondary | ICD-10-CM | POA: Diagnosis not present

## 2020-11-04 DIAGNOSIS — J3081 Allergic rhinitis due to animal (cat) (dog) hair and dander: Secondary | ICD-10-CM | POA: Diagnosis not present

## 2020-11-04 DIAGNOSIS — G629 Polyneuropathy, unspecified: Secondary | ICD-10-CM

## 2020-11-04 DIAGNOSIS — J3089 Other allergic rhinitis: Secondary | ICD-10-CM | POA: Diagnosis not present

## 2020-11-04 DIAGNOSIS — J301 Allergic rhinitis due to pollen: Secondary | ICD-10-CM | POA: Diagnosis not present

## 2020-11-04 NOTE — Chronic Care Management (AMB) (Addendum)
Chronic Care Management Pharmacy Assistant   Name: Joshua [name]  MRN: [id number] DOB: 10/25/1940   Reason for Encounter: General Adherence Call    Recent office visits:  10/21/2020 OV (PCP) [name], MD; upper abdominal pain and chronic follow up, no medication changes indicated.  Recent consult visits:  11/01/2020 OV (cardiology) Pixie Casino, MD; His intolerances to medications sleep few options.  We could possibly consider bempedoic acid however he wished to avoid additional medications at this time.  I think he would likely have side effects with Praluent that would be similar to [place].  Inclisiran may not give him those symptoms, however cost and other issues make it unreasonable at this point for him.  Hospital visits:  None in previous 6 months  Medications: Outpatient Encounter Medications as of 11/04/2020  Medication Sig Note   acetaminophen (TYLENOL) 500 MG tablet Take 1,000 mg by mouth every 6 (six) hours as needed for moderate pain or headache.    albuterol (PROAIR HFA) 108 (90 BASE) MCG/ACT inhaler Inhale 1-2 puffs into the lungs every 6 (six) hours as needed for wheezing or shortness of breath.    amLODipine (NORVASC) 5 MG tablet Take 1 tablet (5 mg total) by mouth daily.    aspirin EC 81 MG tablet Take 1 tablet (81 mg total) by mouth 2 (two) times daily after a meal. 11/01/2020: Patient is taking only 1 tab/day   busPIRone (BUSPAR) 5 MG tablet Take 1 tablet (5 mg total) by mouth 2 (two) times daily as needed. (Patient not taking: No sig reported)    cholecalciferol (VITAMIN D3) 25 MCG (1000 UNIT) tablet Take 1,000 Units by mouth daily.    clotrimazole-betamethasone (LOTRISONE) cream Apply 1 application topically See admin instructions. Twice daily every 4 days    Cyanocobalamin (B-12) 2500 MCG TABS Take 1,250 mcg by mouth daily.    diazepam (VALIUM) 5 MG tablet Take 1 tablet (5 mg total) by mouth at bedtime as needed (sleep).    EPINEPHrine 0.3  mg/0.3 mL IJ SOAJ injection Inject 0.3 mg into the muscle as needed for anaphylaxis. for allergic reaction    ezetimibe (ZETIA) 10 MG tablet Take 1 tablet (10 mg total) by mouth daily.    FLOVENT HFA 110 MCG/ACT inhaler Inhale 1 puff into the lungs 2 (two) times daily.     fluticasone (FLONASE) 50 MCG/ACT nasal spray Place 1 spray into both nostrils 2 (two) times daily.     folic acid (FOLVITE) Q000111Q MCG tablet Take 800 mcg by mouth daily.  11/01/2020: Pt is taking 800 mcg unable to find 400 mcg.   gabapentin (NEURONTIN) 300 MG capsule Take 1 cap in AM, 1 cap at noon, 2 caps at bedtime (Patient taking differently: Take 300-600 mg by mouth See admin instructions. Take 300 mg in the morning, 300 mg at noon, and 600 mg at bedtime)    Glucos-Chond-Hyal Ac-Ca Fructo (MOVE FREE JOINT HEALTH ADVANCE PO) Take 1 tablet by mouth daily.    hydrochlorothiazide (HYDRODIURIL) 25 MG tablet TAKE 1 TABLET BY MOUTH EVERY DAY    ketoconazole (NIZORAL) 2 % cream Apply 1 application topically See admin instructions. Twice a day every other day    loratadine (CLARITIN) 10 MG tablet Take 10 mg by mouth at bedtime.     Miconazole Nitrate (LOTRIMIN AF DEODORANT POWDER) 2 % AERP Apply 1 spray topically daily as needed (jock itch).    nitroGLYCERIN (NITROSTAT) 0.4 MG SL tablet PLACE 1 TABLET UNDER THE  TONGUE EVERY 5 (FIVE) MINUTES AS NEEDED. (Patient taking differently: Place 0.4 mg under the tongue every 5 (five) minutes as needed for chest pain.)    nystatin-triamcinolone ointment (MYCOLOG) Apply 1 application topically See admin instructions. Twice daily every 4 days    pantoprazole (PROTONIX) 40 MG tablet TAKE 1 TABLET BY MOUTH EVERY DAY    polyethylene glycol powder (GLYCOLAX/MIRALAX) powder Take 17 g by mouth daily.    simvastatin (ZOCOR) 20 MG tablet TAKE 1 TABLET BY MOUTH EVERYDAY AT BEDTIME (Patient taking differently: Take 20 mg by mouth at bedtime.)    tiZANidine (ZANAFLEX) 4 MG tablet Take 1 tablet (4 mg total) by  mouth every 6 (six) hours as needed for muscle spasms. 11/01/2020: Pt takes as needed.   valsartan (DIOVAN) 320 MG tablet Take 1 tablet (320 mg total) by mouth daily.    Wheat Dextrin (BENEFIBER) POWD Take 1 Dose by mouth daily. 1 dose = 2 teaspoons    No facility-administered encounter medications on file as of 11/04/2020.    Have you had any problems recently with your health? Patient states he still has knee pain, is going in to see his PCP today. Patient states he thinks Ezetimibe may be causing him to have knee pain.  Have you had any problems with your pharmacy? Patient states he has not had any problems recently with his pharmacy.  What issues or side effects are you having with your medications? Patient states he is unsure if Zetia may be causing his knee pain.  What would you like me to pass along to Leata Mouse, CPP for him to help you with?  The patients wife would like to know if Ezetimibe could possibly cause him to have knee pain?  What can we do to take care of you better? Patient did not have any suggestions.  Future Appointments  Date Time Provider [place]  11/04/2020  9:20 AM [name], MD [facility]  01/08/2021  8:30 AM Cameron Sprang, MD [facility] None  05/15/2021  9:30 AM [facility] HEALTH COACH [facility]    Star Rating Drugs: Losartan Potassium 100 mg last filled 06/14/2020 90 DS Simvastatin 20 mg last filled 09/08/2020 90 DS Valsartan 320 mg last filled 08/08/2020 90 DS   April D Calhoun, [place] Pharmacist Assistant [phone]   10 minutes spent in review, coordination, and documentation.  Reviewed by: Beverly Milch, PharmD Clinical Pharmacist [phone]

## 2020-11-04 NOTE — Patient Instructions (Addendum)
Health Maintenance Due  Topic Date Due   INFLUENZA VACCINE  - Please consider getting your flu shot in the Fall. If you get this outside of our office, please let us know.  10/21/2020   We will call you within two weeks about your referral to CT abdomen/pelvis. If you do not hear within 2 weeks, give [place] a call.   Reduce simvastatin to half tablet for now  Blood pressure  is over-controlled over last few visits but better today. Drop Hydrochlorothiazide to 12.5 MG and update me in 2 weeks how your blood pressure's are doing on MyChart.  Bloodwork next visit   Return in about 3 months (around 02/04/2021) for follow-up or sooner as needed.  Return in about 6 months (around 05/07/2021) for physical or sooner as needed.

## 2020-11-05 DIAGNOSIS — G4733 Obstructive sleep apnea (adult) (pediatric): Secondary | ICD-10-CM | POA: Diagnosis not present

## 2020-11-08 DIAGNOSIS — J301 Allergic rhinitis due to pollen: Secondary | ICD-10-CM | POA: Diagnosis not present

## 2020-11-08 DIAGNOSIS — J3089 Other allergic rhinitis: Secondary | ICD-10-CM | POA: Diagnosis not present

## 2020-11-08 DIAGNOSIS — J3081 Allergic rhinitis due to animal (cat) (dog) hair and dander: Secondary | ICD-10-CM | POA: Diagnosis not present

## 2020-11-18 DIAGNOSIS — Z96652 Presence of left artificial knee joint: Secondary | ICD-10-CM | POA: Diagnosis not present

## 2020-11-22 DIAGNOSIS — J3081 Allergic rhinitis due to animal (cat) (dog) hair and dander: Secondary | ICD-10-CM | POA: Diagnosis not present

## 2020-11-22 DIAGNOSIS — J301 Allergic rhinitis due to pollen: Secondary | ICD-10-CM | POA: Diagnosis not present

## 2020-11-22 DIAGNOSIS — J3089 Other allergic rhinitis: Secondary | ICD-10-CM | POA: Diagnosis not present

## 2020-11-26 ENCOUNTER — Ambulatory Visit
Admission: RE | Admit: 2020-11-26 | Discharge: 2020-11-26 | Disposition: A | Discharge status: Home / Self Care | Payer: PPO | Source: Ambulatory Visit | Attending: Family Medicine | Admitting: Family Medicine

## 2020-11-26 DIAGNOSIS — K4090 Unilateral inguinal hernia, without obstruction or gangrene, not specified as recurrent: Secondary | ICD-10-CM | POA: Diagnosis not present

## 2020-11-26 DIAGNOSIS — M4316 Spondylolisthesis, lumbar region: Secondary | ICD-10-CM | POA: Diagnosis not present

## 2020-11-26 DIAGNOSIS — I77811 Abdominal aortic ectasia: Secondary | ICD-10-CM | POA: Diagnosis not present

## 2020-11-26 DIAGNOSIS — K5790 Diverticulosis of intestine, part unspecified, without perforation or abscess without bleeding: Secondary | ICD-10-CM | POA: Diagnosis not present

## 2020-11-26 DIAGNOSIS — R1013 Epigastric pain: Secondary | ICD-10-CM

## 2020-11-26 MED ORDER — IOPAMIDOL (ISOVUE-300) INJECTION 61%
100.0000 mL | Freq: Once | INTRAVENOUS | Status: AC | PRN
  Administered 2020-11-26: 100 mL via INTRAVENOUS

## 2020-11-28 ENCOUNTER — Other Ambulatory Visit: Payer: Self-pay

## 2020-11-28 DIAGNOSIS — R1013 Epigastric pain: Secondary | ICD-10-CM

## 2020-12-03 ENCOUNTER — Encounter: Payer: Self-pay | Admitting: Gastroenterology

## 2020-12-03 ENCOUNTER — Telehealth: Payer: Self-pay | Admitting: Internal Medicine

## 2020-12-03 DIAGNOSIS — G4733 Obstructive sleep apnea (adult) (pediatric): Secondary | ICD-10-CM | POA: Diagnosis not present

## 2020-12-03 NOTE — Telephone Encounter (Signed)
Pt called back Pt informed of providers result & recommendations. Pt verbalized understanding. No further questions . Informed pt that since he has stopped medication and is still having left knee pain this pain is not related to our cardiac medication and he will need to have input/appt with his PCP.

## 2020-12-03 NOTE — Telephone Encounter (Signed)
Pt c/o medication issue:  1. Name of Medication:  simvastatin (ZOCOR) 20 MG tablet  2. How are you currently taking this medication (dosage and times per day)?  Patient states he hasn't been taking it  3. Are you having a reaction (difficulty breathing--STAT)?  No  4. What is your medication issue?   Patient states he stopped taking this medication for 1 week as advised, but he is still having leg back. He states this has been discussed with Dr. Debara Pickett. He also states he sent Dr. Yong Channel (pcp) his BP readings on MyChart and he would like to know if Dr. Debara Pickett can review them.

## 2020-12-03 NOTE — Telephone Encounter (Signed)
Patient returning call.

## 2020-12-03 NOTE — Telephone Encounter (Signed)
Left message for pt to go over symptoms that he is having and medication issue.

## 2020-12-06 DIAGNOSIS — J3089 Other allergic rhinitis: Secondary | ICD-10-CM | POA: Diagnosis not present

## 2020-12-06 DIAGNOSIS — J3081 Allergic rhinitis due to animal (cat) (dog) hair and dander: Secondary | ICD-10-CM | POA: Diagnosis not present

## 2020-12-06 DIAGNOSIS — J301 Allergic rhinitis due to pollen: Secondary | ICD-10-CM | POA: Diagnosis not present

## 2020-12-11 ENCOUNTER — Ambulatory Visit: Payer: PPO | Attending: Internal Medicine

## 2020-12-11 ENCOUNTER — Other Ambulatory Visit: Payer: Self-pay

## 2020-12-11 ENCOUNTER — Ambulatory Visit (INDEPENDENT_AMBULATORY_CARE_PROVIDER_SITE_OTHER): Payer: PPO

## 2020-12-11 DIAGNOSIS — Z23 Encounter for immunization: Secondary | ICD-10-CM

## 2020-12-11 NOTE — Progress Notes (Signed)
   Covid-19 Vaccination Clinic  Name:  JT BRABEC    MRN: [id number] DOB: 05-24-1940  12/11/2020  Mr. Desroches was observed post Covid-19 immunization for 15 minutes without incident. He was provided with Vaccine Information Sheet and instruction to access the V-Safe system.   Mr. Perrell was instructed to call 911 with any severe reactions post vaccine: Difficulty breathing  Swelling of face and throat  A fast heartbeat  A bad rash all over body  Dizziness and weakness

## 2020-12-17 ENCOUNTER — Other Ambulatory Visit (HOSPITAL_BASED_OUTPATIENT_CLINIC_OR_DEPARTMENT_OTHER): Payer: Self-pay

## 2020-12-17 MED ORDER — COVID-19MRNA BIVAL VACC PFIZER 30 MCG/0.3ML IM SUSP
INTRAMUSCULAR | 0 refills | Status: DC
  Filled 2020-12-17: qty 0.3, 1d supply, fill #0

## 2020-12-20 ENCOUNTER — Telehealth: Payer: Self-pay | Admitting: Internal Medicine

## 2020-12-20 ENCOUNTER — Ambulatory Visit: Payer: PPO | Admitting: Internal Medicine

## 2020-12-20 ENCOUNTER — Other Ambulatory Visit: Payer: Self-pay

## 2020-12-20 ENCOUNTER — Encounter: Payer: Self-pay | Admitting: Internal Medicine

## 2020-12-20 ENCOUNTER — Encounter: Payer: Self-pay | Admitting: Family Medicine

## 2020-12-20 VITALS — BP 128/72 | HR 82 | Ht 66.0 in | Wt 198.0 lb

## 2020-12-20 DIAGNOSIS — I25118 Atherosclerotic heart disease of native coronary artery with other forms of angina pectoris: Secondary | ICD-10-CM

## 2020-12-20 DIAGNOSIS — J301 Allergic rhinitis due to pollen: Secondary | ICD-10-CM | POA: Diagnosis not present

## 2020-12-20 DIAGNOSIS — E785 Hyperlipidemia, unspecified: Secondary | ICD-10-CM | POA: Diagnosis not present

## 2020-12-20 DIAGNOSIS — T466X5D Adverse effect of antihyperlipidemic and antiarteriosclerotic drugs, subsequent encounter: Secondary | ICD-10-CM

## 2020-12-20 DIAGNOSIS — J3089 Other allergic rhinitis: Secondary | ICD-10-CM | POA: Diagnosis not present

## 2020-12-20 DIAGNOSIS — M791 Myalgia, unspecified site: Secondary | ICD-10-CM

## 2020-12-20 DIAGNOSIS — T466X5A Adverse effect of antihyperlipidemic and antiarteriosclerotic drugs, initial encounter: Secondary | ICD-10-CM

## 2020-12-20 NOTE — Patient Instructions (Signed)
Medication Instructions:  Your Leqvio injection is scheduled on ________ at _________. Please arrive at [facility] and enter the hospital through [facility] [employer] that's located at [facility] [facility] 15 minutes before your scheduled injection time. Walk in to the registration desk and let them know that you are scheduled for an injection in the infusion center. They will register you and take you to your appointment.   Marion Downer is a subcutaneous injection that will lower your LDL cholesterol by 50%. If this is your first injection, your next injection will be due in 3 months. If this is NOT your first injection, your next injection will be due in 6 months. If you have not heard from [facility] to schedule your next appointment within 2 weeks of your next anticipated injection, please call [facility] to schedule this at:         [phone] - if you are seen at the [facility] office  *If you need a refill on your cardiac medications before your next appointment, please call your pharmacy*   Lab Work: FASTING lab work to check cholesterol in about 6 months  If you have labs (blood work) drawn today and your tests are completely normal, you will receive your results only by: [place] (if you have MyChart) OR A paper copy in the mail If you have any lab test that is abnormal or we need to change your treatment, we will call you to review the results.    Follow-Up: At [facility], you and your health needs are our priority.  As part of our continuing mission to provide you with exceptional heart care, we have created designated Provider Care Teams.  These Care Teams include your primary Cardiologist (physician) and Advanced Practice Providers (APPs -  Physician Assistants and Nurse Practitioners) who all work together to provide you with the care you need, when you need it.  We recommend signing up for the patient portal called "MyChart".  Sign up  information is provided on this After Visit Summary.  MyChart is used to connect with patients for Virtual Visits (Telemedicine).  Patients are able to view lab/test results, encounter notes, upcoming appointments, etc.  Non-urgent messages can be sent to your provider as well.   To learn more about what you can do with MyChart, go to [email or web address].    Your next appointment:   6 month(s) - lipid clinic  The format for your next appointment:   In Person  Provider:   K. Mali Hilty, MD   Other Instructions

## 2020-12-20 NOTE — Telephone Encounter (Signed)
Faxed benefits investigation form to [facility] @ [phone]  Patient has HTA plan

## 2020-12-20 NOTE — Progress Notes (Signed)
LIPID CLINIC NOTE  Chief Complaint:  Follow-up  Primary Care Physician: [name], MD  Primary Cardiologist:  [name], MD  HPI:  Joshua [name] is a 80 y.o. male with a history of 5 vessel bypass more than 20 years ago and recent symptoms of unstable angina with PCI to the origin to the proximal graft lesion of the circumflex in January 2021.  He also has a history of dyslipidemia, hypertension, obstructive sleep apnea, prostate cancer and bladder cancer in the past.  He has been intolerant to numerous statins and had been maintained on Repatha with significant reduction in his lipids with LDL particularly into the 80s in the past however noted over the 2 years that he was taking the medication that he had side effects which initially lasted for 24 to 48 hours then up to 72 to 96 hours after each injection.  He said it was getting to the point where he felt it was intolerable and then was advised to discontinue the medicine.  Currently on simvastatin 20 mg daily which she seems to be able to tolerate, however her lipid profile 2 months ago showed total cholesterol 215, triglycerides 240, HDL 38 and LDL 134.  He remains still well above target LDL less than 70.  This visit today discussed options to see if we can get him to target.  04/24/2020   [name] returns today for follow-up.  He is actually doing very well with recent changes in his lipid management.  He is tolerating addition of ezetimibe.  Total cholesterol now is 155, HDL 41, LDL 84 (decreased from 134) and triglycerides 176.  His target LDL is less than 70 and he is nearing that.  We discussed other possible options.  He says he is looking forward to becoming more active in the near term after having some work possibly done on his orthopedic issues.  Hopefully this will help improve his numbers further.  Since he cannot tolerate the antibody PCSK9 inhibitors, we also discussed the possibility of inclisiran.  This is  now FDA approved but has not yet made it into patients in our area.  Cost may be an issue however we are still enrolling patients in the Walker trial and he may be a candidate for that.  11/01/2020  [name] continues to do well.  His lipids have been fairly stable if not slightly improved.  Total cholesterol now 161, HDL 50, triglycerides 147 and LDL 85.  He is on simvastatin and ezetimibe.  He cannot tolerate other statins and had an antibody reaction to the PCSK9 inhibitors causing immune/flulike symptoms.  I suspect he will not likely tolerate Praluent for similar mechanism.  Inclisiran would likely be too costly at this point and were having difficulty getting into patients.  His options are otherwise limited for further reduction to target LDL less than 70.  He has been quite stable.  He continues to have problems with his knee which limits his activity.  12/20/2020  Mr. Joshua [name] is seen today for follow-up.  He had some concerns about his blood pressure, specifically he has stopped his simvastatin.  I told him that this was not a blood pressure medication in fact his blood pressure is excellent today 128/72.  The simvastatin was stopped due to side effects including myalgias which did go away significantly after discontinuing it.  He remains on low-dose ezetimibe.  Cholesterol will undoubtedly increase.  He unfortunate did not qualify for Orion-4 due to the  fact that he had not had a prior cardiovascular event although he has had bypass.  I did discuss with him the fact that he could potentially get commercially available inclisiran, now marketed as Leqvio.  He does have Medicare however health team advantage plan and does not have supplemental coverage and cost therefore may be untenable for him.  PMHx:  Past Medical History:  Diagnosis Date   Acute medial meniscal tear    Anemia 2015   Arthritis    "middle finger right hand; right knee; neck" (02/06/2014)   Asthma    seasonal   Bladder  cancer ([place]) 04/2010   "cauterized during prostate OR"   CAD (coronary artery disease)    CABG 2001   Diverticulosis    DJD (degenerative joint disease)    BACK   GERD (gastroesophageal reflux disease)    H/O inguinal hernia repair 12/2018   History of gout    Hyperlipidemia    Hypertension    IBS (irritable bowel syndrome)    Obesity    OSA (obstructive sleep apnea)    USES CPAP    Pancreatitis ~ 1980   Peripheral vascular disease ([place])    Pre-diabetes    Prostate cancer ([place]) 04/2010    Past Surgical History:  Procedure Laterality Date   ANTERIOR CERVICAL DECOMP/DISCECTOMY FUSION  05/26/2006   PLATE PLACED   BACK SURGERY     CARDIAC CATHETERIZATION  11/20/1999   CORONARY ARTERY BYPASS GRAFT  11/20/1999   SVG-RI1-RI2, SVG-OM, SVG-dRCA   CORONARY STENT INTERVENTION N/A 04/13/2019   Procedure: CORONARY STENT INTERVENTION;  Surgeon: Burnell Blanks, MD;  Location: [facility];  Service: Cardiovascular;  Laterality: N/A;   HERNIA REPAIR     INGUINAL HERNIA REPAIR Left 01/02/2019   Procedure: OPEN LEFT INGUINAL HERNIA REPAIR WITH MESH;  Surgeon: Johnathan Hausen, MD;  Location: [facility];  Service: General;  Laterality: Left;   KNEE ARTHROSCOPY Right 1992; 04/2008   LAPAROSCOPIC CHOLECYSTECTOMY  2007   LEFT HEART CATH AND CORS/GRAFTS ANGIOGRAPHY N/A 04/13/2019   Procedure: LEFT HEART CATH AND CORS/GRAFTS ANGIOGRAPHY;  Surgeon: Burnell Blanks, MD;  Location: [facility];  Service: Cardiovascular;  Laterality: N/A;   NASAL SEPTUM SURGERY Right 2007   ROBOT ASSISTED LAPAROSCOPIC RADICAL PROSTATECTOMY  04/2010   THYROIDECTOMY, PARTIAL  1980   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   Procedure: LEFT TOTAL KNEE ARTHROPLASTY;  Surgeon: Melrose Nakayama, MD;  Location: [facility];  Service: Orthopedics;  Laterality: Left;    FAMHx:  Family History  Problem Relation Age of Onset   Heart disease Mother    Hyperlipidemia Mother    Heart disease Father     Hyperlipidemia Father    Diabetes Brother     SOCHx:   reports that he quit smoking about 28 years ago. His smoking use included cigarettes. He has a 35.00 pack-year smoking history. He has never used smokeless tobacco. He reports current alcohol use of about 2.0 standard drinks per week. He reports that he does not use drugs.  ALLERGIES:  Allergies  Allergen Reactions   Benadryl [Diphenhydramine] Other (See Comments)    Pt told not to take because of bypass surgery   Bromfed Nausea And Vomiting   Clarithromycin Other (See Comments)     gastritis   Codeine Other (See Comments)    Trouble breathing   Doxycycline Hyclate Nausea And Vomiting   Lexapro [Escitalopram]     Dizziness   Modafinil Other (See Comments)  anxiety-nervousness   Oxycodone-Acetaminophen Itching   Promethazine Hcl Other (See Comments)     fainting   Quinolones Nausea Only    Cipro "felt real bad"   Telithromycin Nausea And Vomiting   Tramadol Nausea Only   Hydrocodone-Acetaminophen Rash   Statins Other (See Comments)    Leg cramps and makes patient feel bad. Per pt tried multiple in years past   Sulfonamide Derivatives Rash    ROS: Pertinent items noted in HPI and remainder of comprehensive ROS otherwise negative.  HOME MEDS: Current Outpatient Medications on File Prior to Visit  Medication Sig Dispense Refill   acetaminophen (TYLENOL) 500 MG tablet Take 1,000 mg by mouth every 6 (six) hours as needed for moderate pain or headache.     albuterol (PROAIR HFA) 108 (90 BASE) MCG/ACT inhaler Inhale 1-2 puffs into the lungs every 6 (six) hours as needed for wheezing or shortness of breath. 18 g 3   amLODipine (NORVASC) 5 MG tablet Take 1 tablet (5 mg total) by mouth daily. 90 tablet 3   aspirin EC 81 MG tablet Take 1 tablet (81 mg total) by mouth 2 (two) times daily after a meal. 30 tablet 11   cholecalciferol (VITAMIN D3) 25 MCG (1000 UNIT) tablet Take 1,000 Units by mouth daily.      clotrimazole-betamethasone (LOTRISONE) cream Apply 1 application topically See admin instructions. Twice daily every 4 days     COVID-19 mRNA bivalent vaccine, Pfizer, injection Inject into the muscle. 0.3 mL 0   Cyanocobalamin (B-12) 2500 MCG TABS Take 1,250 mcg by mouth daily.     diazepam (VALIUM) 5 MG tablet Take 1 tablet (5 mg total) by mouth at bedtime as needed (sleep). 30 tablet 1   EPINEPHrine 0.3 mg/0.3 mL IJ SOAJ injection Inject 0.3 mg into the muscle as needed for anaphylaxis. for allergic reaction     FLOVENT HFA 110 MCG/ACT inhaler Inhale 1 puff into the lungs 2 (two) times daily.   5   fluticasone (FLONASE) 50 MCG/ACT nasal spray Place 1 spray into both nostrils 2 (two) times daily.      folic acid (FOLVITE) 564 MCG tablet Take 800 mcg by mouth daily.      gabapentin (NEURONTIN) 300 MG capsule Take 1 cap in AM, 1 cap at noon, 2 caps at bedtime (Patient taking differently: Take 300-600 mg by mouth See admin instructions. Take 300 mg in the morning, 300 mg at noon, and 600 mg at bedtime) 360 capsule 3   Glucos-Chond-Hyal Ac-Ca Fructo (MOVE FREE JOINT HEALTH ADVANCE PO) Take 1 tablet by mouth daily.     hydrochlorothiazide (HYDRODIURIL) 25 MG tablet TAKE 1 TABLET BY MOUTH EVERY DAY 90 tablet 2   ketoconazole (NIZORAL) 2 % cream Apply 1 application topically See admin instructions. Twice a day every other day     loratadine (CLARITIN) 10 MG tablet Take 10 mg by mouth at bedtime.      Miconazole Nitrate (LOTRIMIN AF DEODORANT POWDER) 2 % AERP Apply 1 spray topically daily as needed (jock itch).     nitroGLYCERIN (NITROSTAT) 0.4 MG SL tablet PLACE 1 TABLET UNDER THE TONGUE EVERY 5 (FIVE) MINUTES AS NEEDED. (Patient taking differently: Place 0.4 mg under the tongue every 5 (five) minutes as needed for chest pain.) 75 tablet 1   nystatin-triamcinolone ointment (MYCOLOG) Apply 1 application topically See admin instructions. Twice daily every 4 days     pantoprazole (PROTONIX) 40 MG tablet TAKE  1 TABLET BY MOUTH EVERY DAY 90 tablet  3   polyethylene glycol powder (GLYCOLAX/MIRALAX) powder Take 17 g by mouth daily. 255 g 0   tiZANidine (ZANAFLEX) 4 MG tablet Take 1 tablet (4 mg total) by mouth every 6 (six) hours as needed for muscle spasms. 40 tablet 1   valsartan (DIOVAN) 320 MG tablet Take 1 tablet (320 mg total) by mouth daily. 90 tablet 3   Wheat Dextrin (BENEFIBER) POWD Take 1 Dose by mouth daily. 1 dose = 2 teaspoons     ezetimibe (ZETIA) 10 MG tablet Take 1 tablet (10 mg total) by mouth daily. 90 tablet 3   simvastatin (ZOCOR) 20 MG tablet TAKE 1 TABLET BY MOUTH EVERYDAY AT BEDTIME (Patient not taking: Reported on 12/20/2020) 90 tablet 3   No current facility-administered medications on file prior to visit.    LABS/IMAGING: No results found. However, due to the size of the patient record, not all encounters were searched. Please check Results Review for a complete set of results. No results found.  LIPID PANEL:    Component Value Date/Time   CHOL 161 10/22/2020 0835   TRIG 147 10/22/2020 0835   HDL 50 10/22/2020 0835   CHOLHDL 3.2 10/22/2020 0835   CHOLHDL 3.5 02/23/2020 0933   VLDL 31.6 11/03/2018 0806   LDLCALC 85 10/22/2020 0835   LDLCALC 84 02/23/2020 0933   LDLDIRECT 115.0 06/04/2016 0919    WEIGHTS: Wt Readings from Last 3 Encounters:  12/20/20 198 lb (89.8 kg)  11/04/20 199 lb 3.2 oz (90.4 kg)  11/01/20 192 lb (87.1 kg)    VITALS: BP 128/72   Pulse 82   Ht 5\' 6"  (1.676 m)   Wt 198 lb (89.8 kg)   SpO2 96%   BMI 31.96 kg/m   EXAM: Deferred  EKG: Deferred  ASSESSMENT: Mixed dyslipidemia, goal LDL less than 70 Coronary artery disease status post 5 vessel CABG (2000) Recent unstable angina and PCI January 2021 Hypertension Obstructive sleep apnea Statin intolerance-myalgias Repatha intolerance  PLAN: 1.   Mr. Hoel has very good blood pressure control today.  I do not think that is been an issue.  His cholesterol in fact is as low as its  been however he is not tolerating simvastatin.  He discontinued it and he noted that his myalgias have resolved.  Options are limited since he cannot take a PCSK9 inhibitor as well.  We discussed the possibility of inclisiran Marion Downer).  Cost may be an issue however we will go ahead and try to pursue that.  Is very well-tolerated with few of any side effects.  Follow-up in 6 months sooner as necessary.  Pixie Casino, MD, [facility], [place] Director of the Advanced Lipid Disorders &  Cardiovascular Risk Reduction Clinic Diplomate of the American Board of Clinical Lipidology Attending Cardiologist  Direct Dial: [phone]  Fax: [phone]  Website:  [email or web address] 12/20/2020, 9:58 AM

## 2020-12-23 DIAGNOSIS — Z8546 Personal history of malignant neoplasm of prostate: Secondary | ICD-10-CM | POA: Diagnosis not present

## 2020-12-25 NOTE — Telephone Encounter (Signed)
Patient was calling to check the status of the application for his Leqvio. Please call

## 2020-12-27 ENCOUNTER — Ambulatory Visit: Payer: PPO | Admitting: Gastroenterology

## 2020-12-27 ENCOUNTER — Encounter: Payer: Self-pay | Admitting: Gastroenterology

## 2020-12-27 VITALS — BP 120/60 | HR 73 | Ht 66.0 in | Wt 199.0 lb

## 2020-12-27 DIAGNOSIS — R140 Abdominal distension (gaseous): Secondary | ICD-10-CM | POA: Diagnosis not present

## 2020-12-27 DIAGNOSIS — R1084 Generalized abdominal pain: Secondary | ICD-10-CM

## 2020-12-27 NOTE — Patient Instructions (Signed)
If you are age 78 or older, your body mass index should be between 23-30. Your Body mass index is 32.12 kg/m. If this is out of the aforementioned range listed, please consider follow up with your Primary Care Provider. __________________________________________________________  The [facility] providers would like to encourage you to use [facility] to communicate with providers for non-urgent requests or questions.  Due to long hold times on the telephone, sending your provider a message by [facility] may be a faster and more efficient way to get a response.  Please allow 48 business hours for a response.  Please remember that this is for non-urgent requests.   You have been given a testing kit to check for small intestine bacterial overgrowth (SIBO) which is completed by a company named [employer]. Make sure to return your test in the mail using the return mailing label given to you along with the kit. Your demographic and insurance information have already been sent to the company and they should be in contact with you over the next 2-3 weeks regarding this test. [facility] will collect an upfront charge of $99.74 for commercial insurance plans and $209.74 is you are paying cash. Make sure to discuss with [facility] PRIOR to having the test if they have gotten informatoin from your insurance company as to how much your testing will cost out of pocket, if any. Please keep in mind that you will be getting a call from phone number [phone] or a similar number.   Janett Billow has suggested that you pick up FDGuard from your pharmacy. We have given you some IBGuard to try. Follow the directions provided on the box.  We will give you a call with your results and discuss follow up at that time.  Thank you for entrusting me with your care and choosing [facility].  Alonza Bogus, PA-C

## 2020-12-27 NOTE — Progress Notes (Signed)
12/27/2020 Joshua [name] [id number] [birth year]   HISTORY OF PRESENT ILLNESS: This is a 80 year old male who is a patient of Dr. Vena Rua, although does not appear that he has actually ever seen Dr. Hilarie Fredrickson.  He was previously a patient of Dr. Buel Ream.  Last seen here in 2016.  He presents here today with complaints of generalized abdominal discomfort and bloating.  He says that his mid abdomen becomes very visibly bloated and more uncomfortable than painful.  He says that he has a lot of gas/flatulence.  He has a diagnosis of IBS and previously had issues with constipation, but says that he takes his MiraLAX actually twice a day and Benefiber 2 teaspoons daily and moves his bowels usually 3-4 times a day.  He says that his gut is always making all kinds of noises.  He denies any rectal bleeding.  He had a CT scan of the abdomen and pelvis with contrast that was ordered by his PCP, Dr. Yong Channel, last month that did not show any explanation for his symptoms.  This is what prompted the referral to our office.  He says that he has tried Gas-X and all different things over-the-counter for his gas and bloating.  Upper endoscopy in September 2010 showing a hiatal hernia and otherwise normal exam and also had colonoscopy at that same time showing moderate diverticulosis throughout the colon, no polyps and was to have a 10 year interval follow-up.   Past Medical History:  Diagnosis Date   Acute medial meniscal tear    Anemia 2015   Arthritis    "middle finger right hand; right knee; neck" (02/06/2014)   Asthma    seasonal   Bladder cancer ([place]) 04/2010   "cauterized during prostate OR"   CAD (coronary artery disease)    CABG 2001   Diverticulosis    DJD (degenerative joint disease)    BACK   GERD (gastroesophageal reflux disease)    H/O inguinal hernia repair 12/2018   History of gout    Hyperlipidemia    Hypertension    IBS (irritable bowel syndrome)    Obesity    OSA (obstructive  sleep apnea)    USES CPAP    Pancreatitis ~ 1980   Peripheral vascular disease ([place])    Pre-diabetes    Prostate cancer ([place]) 04/2010   Past Surgical History:  Procedure Laterality Date   ANTERIOR CERVICAL DECOMP/DISCECTOMY FUSION  05/26/2006   PLATE PLACED   BACK SURGERY     CARDIAC CATHETERIZATION  11/20/1999   CORONARY ARTERY BYPASS GRAFT  11/20/1999   SVG-RI1-RI2, SVG-OM, SVG-dRCA   CORONARY STENT INTERVENTION N/A 04/13/2019   Procedure: CORONARY STENT INTERVENTION;  Surgeon: Burnell Blanks, MD;  Location: [facility];  Service: Cardiovascular;  Laterality: N/A;   HERNIA REPAIR     INGUINAL HERNIA REPAIR Left 01/02/2019   Procedure: OPEN LEFT INGUINAL HERNIA REPAIR WITH MESH;  Surgeon: Johnathan Hausen, MD;  [facility]: [facility];  Service: General;  Laterality: Left;   KNEE ARTHROSCOPY Right 1992; 04/2008   LAPAROSCOPIC CHOLECYSTECTOMY  2007   LEFT HEART CATH AND CORS/GRAFTS ANGIOGRAPHY N/A 04/13/2019   Procedure: LEFT HEART CATH AND CORS/GRAFTS ANGIOGRAPHY;  Surgeon: Burnell Blanks, MD;  Location: [facility];  Service: Cardiovascular;  Laterality: N/A;   NASAL SEPTUM SURGERY Right 2007   ROBOT ASSISTED LAPAROSCOPIC RADICAL PROSTATECTOMY  04/2010   THYROIDECTOMY, PARTIAL  1980   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   Procedure: LEFT TOTAL KNEE  ARTHROPLASTY;  Surgeon: Melrose Nakayama, MD;  [facility]: [facility];  Service: Orthopedics;  Laterality: Left;    reports that he quit smoking about 28 years ago. His smoking use included cigarettes. He has a 35.00 pack-year smoking history. He has never used smokeless tobacco. He reports current alcohol use of about 2.0 standard drinks per week. He reports that he does not use drugs. family history includes Diabetes in his brother; Heart disease in his father and mother; Hyperlipidemia in his father and mother. Allergies  Allergen Reactions   Benadryl [Diphenhydramine] Other (See Comments)    Pt told not to take because  of bypass surgery   Bromfed Nausea And Vomiting   Clarithromycin Other (See Comments)     gastritis   Codeine Other (See Comments)    Trouble breathing   Doxycycline Hyclate Nausea And Vomiting   Lexapro [Escitalopram]     Dizziness   Modafinil Other (See Comments)     anxiety-nervousness   Oxycodone-Acetaminophen Itching   Promethazine Hcl Other (See Comments)     fainting   Quinolones Nausea Only    Cipro "felt real bad"   Telithromycin Nausea And Vomiting   Tramadol Nausea Only   Hydrocodone-Acetaminophen Rash   Statins Other (See Comments)    Leg cramps and makes patient feel bad. Per pt tried multiple in years past   Sulfonamide Derivatives Rash      Outpatient Encounter Medications as of 12/27/2020  Medication Sig   acetaminophen (TYLENOL) 500 MG tablet Take 1,000 mg by mouth every 6 (six) hours as needed for moderate pain or headache.   albuterol (PROAIR HFA) 108 (90 BASE) MCG/ACT inhaler Inhale 1-2 puffs into the lungs every 6 (six) hours as needed for wheezing or shortness of breath.   amLODipine (NORVASC) 5 MG tablet Take 1 tablet (5 mg total) by mouth daily.   aspirin EC 81 MG tablet Take 1 tablet (81 mg total) by mouth 2 (two) times daily after a meal.   cholecalciferol (VITAMIN D3) 25 MCG (1000 UNIT) tablet Take 1,000 Units by mouth daily.   clotrimazole-betamethasone (LOTRISONE) cream Apply 1 application topically See admin instructions. Twice daily every 4 days   COVID-19 mRNA bivalent vaccine, Pfizer, injection Inject into the muscle.   Cyanocobalamin (B-12) 2500 MCG TABS Take 1,250 mcg by mouth daily.   diazepam (VALIUM) 5 MG tablet Take 1 tablet (5 mg total) by mouth at bedtime as needed (sleep).   EPINEPHrine 0.3 mg/0.3 mL IJ SOAJ injection Inject 0.3 mg into the muscle as needed for anaphylaxis. for allergic reaction   FLOVENT HFA 110 MCG/ACT inhaler Inhale 1 puff into the lungs 2 (two) times daily.    fluticasone (FLONASE) 50 MCG/ACT nasal spray Place 1  spray into both nostrils 2 (two) times daily.    folic acid (FOLVITE) 712 MCG tablet Take 800 mcg by mouth daily.    gabapentin (NEURONTIN) 300 MG capsule Take 1 cap in AM, 1 cap at noon, 2 caps at bedtime (Patient taking differently: Take 300-600 mg by mouth See admin instructions. Take 300 mg in the morning, 300 mg at noon, and 600 mg at bedtime)   Glucos-Chond-Hyal Ac-Ca Fructo (MOVE FREE JOINT HEALTH ADVANCE PO) Take 1 tablet by mouth daily.   hydrochlorothiazide (HYDRODIURIL) 25 MG tablet TAKE 1 TABLET BY MOUTH EVERY DAY   ketoconazole (NIZORAL) 2 % cream Apply 1 application topically See admin instructions. Twice a day every other day   loratadine (CLARITIN) 10 MG tablet Take 10 mg by  mouth at bedtime.    Miconazole Nitrate (LOTRIMIN AF DEODORANT POWDER) 2 % AERP Apply 1 spray topically daily as needed (jock itch).   nitroGLYCERIN (NITROSTAT) 0.4 MG SL tablet PLACE 1 TABLET UNDER THE TONGUE EVERY 5 (FIVE) MINUTES AS NEEDED. (Patient taking differently: Place 0.4 mg under the tongue every 5 (five) minutes as needed for chest pain.)   nystatin-triamcinolone ointment (MYCOLOG) Apply 1 application topically See admin instructions. Twice daily every 4 days   pantoprazole (PROTONIX) 40 MG tablet TAKE 1 TABLET BY MOUTH EVERY DAY   polyethylene glycol powder (GLYCOLAX/MIRALAX) powder Take 17 g by mouth daily.   tiZANidine (ZANAFLEX) 4 MG tablet Take 1 tablet (4 mg total) by mouth every 6 (six) hours as needed for muscle spasms.   valsartan (DIOVAN) 320 MG tablet Take 1 tablet (320 mg total) by mouth daily.   Wheat Dextrin (BENEFIBER) POWD Take 1 Dose by mouth daily. 1 dose = 2 teaspoons   ezetimibe (ZETIA) 10 MG tablet Take 1 tablet (10 mg total) by mouth daily.   [DISCONTINUED] simvastatin (ZOCOR) 20 MG tablet TAKE 1 TABLET BY MOUTH EVERYDAY AT BEDTIME (Patient not taking: No sig reported)   No facility-administered encounter medications on file as of 12/27/2020.    REVIEW OF SYSTEMS  : All other  systems reviewed and negative except where noted in the History of Present Illness.   PHYSICAL EXAM: Ht 5\' 6"  (1.676 m)   Wt 199 lb (90.3 kg)   BMI 32.12 kg/m  General: Well developed white male in no acute distress Head: Normocephalic and atraumatic Eyes:  Sclerae anicteric, conjunctiva pink. Ears: Normal auditory acuity Lungs: Clear throughout to auscultation; no W/R/R. Heart: Regular rate and rhythm; no M/R/G. Abdomen: Soft, non-distended.  BS present.  Moderate diffuse TTP. Musculoskeletal: Symmetrical with no gross deformities  Skin: No lesions on visible extremities Extremities: No edema  Neurological: Alert oriented x 4, grossly non-focal Psychological:  Alert and cooperative. Normal mood and affect  ASSESSMENT AND PLAN: *80 year old male with complaints of generalized abdominal pain/discomfort with bloating, hyperactivity, and extensive borborygmi.  He previously had complaints of constipation and has a diagnosis of IBS, but he states that he is taking his MiraLAX twice daily and Benefiber 2 teaspoons daily and is having 3-4 bowel movements daily.  We discussed colonoscopy as his last was 12 years ago.  He says that he prefers not to if possible.  CT scan of the abdomen and pelvis with contrast last month did not show any source of his symptoms.  We will proceed with SIBO breath test to rule that out.  If positive obviously will treat that.  I have given him samples of FD guard to try.  We discussed IBgard, but we did not have any samples today.  He is certainly willing to try both of those for symptomatic management in the meantime.   CC:  [name], MD

## 2020-12-30 DIAGNOSIS — Z8546 Personal history of malignant neoplasm of prostate: Secondary | ICD-10-CM | POA: Diagnosis not present

## 2020-12-30 DIAGNOSIS — N35013 Post-traumatic anterior urethral stricture: Secondary | ICD-10-CM | POA: Diagnosis not present

## 2020-12-31 NOTE — Telephone Encounter (Signed)
Received Leqvio benefits investigation fax  Patient has [employer] PPO plan. Marion Downer is covered at 80% until the patient reaches $3450 calendar OOP max. Once this is met, Marion Downer is covered at 100%. Patient has met $2172.81 towards OOP max as of 12/25/20  Prior authorization is NOT required

## 2020-12-31 NOTE — Telephone Encounter (Signed)
Message sent to patient via MyChart w/update on Leqvio benefits

## 2021-01-01 ENCOUNTER — Encounter: Payer: Self-pay | Admitting: Family Medicine

## 2021-01-01 ENCOUNTER — Ambulatory Visit (INDEPENDENT_AMBULATORY_CARE_PROVIDER_SITE_OTHER): Payer: PPO | Admitting: Family Medicine

## 2021-01-01 ENCOUNTER — Other Ambulatory Visit: Payer: Self-pay

## 2021-01-01 ENCOUNTER — Encounter: Payer: Self-pay | Admitting: Cardiology

## 2021-01-01 VITALS — BP 120/72 | HR 69 | Temp 97.3°F | Ht 66.0 in | Wt 197.4 lb

## 2021-01-01 DIAGNOSIS — R21 Rash and other nonspecific skin eruption: Secondary | ICD-10-CM | POA: Diagnosis not present

## 2021-01-01 DIAGNOSIS — F324 Major depressive disorder, single episode, in partial remission: Secondary | ICD-10-CM

## 2021-01-01 DIAGNOSIS — W19XXXA Unspecified fall, initial encounter: Secondary | ICD-10-CM

## 2021-01-01 DIAGNOSIS — E785 Hyperlipidemia, unspecified: Secondary | ICD-10-CM | POA: Diagnosis not present

## 2021-01-01 DIAGNOSIS — I700 Atherosclerosis of aorta: Secondary | ICD-10-CM

## 2021-01-01 MED ORDER — BUSPIRONE HCL 5 MG PO TABS: 5.0000 mg | ORAL_TABLET | Freq: Two times a day (BID) | ORAL | 2 refills | Status: DC | PRN

## 2021-01-01 NOTE — Patient Instructions (Signed)
Great job with the rash  Hopefully the cost situation can be improved with sounds like possible infusion- regardless hope it helps your cholesterol signficantly  Recommended follow up: keep November visit or sooner if needed

## 2021-01-01 NOTE — Telephone Encounter (Signed)
[name] is calling requesting a callback from [place] in regards to the [place] message he received.

## 2021-01-01 NOTE — Progress Notes (Signed)
Phone [phone] In person visit   Subjective:   Joshua [name] is a 80 y.o. year old very pleasant male patient who presents for/with See problem oriented charting Chief Complaint  Patient presents with   Itching    Itching in his genital area    This visit occurred during the SARS-CoV-2 public health emergency.  Safety protocols were in place, including screening questions prior to the visit, additional usage of staff PPE, and extensive cleaning of exam room while observing appropriate contact time as indicated for disinfecting solutions.   Past Medical History-  Patient Active Problem List   Diagnosis Date Noted   Coronary atherosclerosis 10/12/2006    Priority: 1.   Irritable bowel syndrome 10/12/2006    Priority: 1.   Overactive bladder 11/24/2019    Priority: 2.   Hyperglycemia 10/07/2017    Priority: 2.   Gout 11/20/2016    Priority: 2.   CKD (chronic kidney disease), stage III ([place]) 10/23/2013    Priority: 2.   Neuropathy ([place]) 08/21/2013    Priority: 2.   History of bladder cancer 04/01/2010    Priority: 2.   Essential hypertension 02/21/2007    Priority: 2.   Hyperlipidemia 10/12/2006    Priority: 2.   Depression, major, single episode, in partial remission ([place]) 10/12/2006    Priority: 2.   Asthma 10/12/2006    Priority: 2.   Aortic atherosclerosis ([place]) 05/05/2018    Priority: 3.   DJD (degenerative joint disease)     Priority: 3.   Arthritis     Priority: 3.   Neck pain 02/08/2017    Priority: 3.   Venous insufficiency 12/29/2016    Priority: 3.   Ganglion cyst of tendon sheath of right hand 12/29/2016    Priority: 3.   Avulsion fracture of metatarsal bone of right foot 11/25/2016    Priority: 3.   Obesity, Class I, BMI 30-34.9 10/03/2015    Priority: 3.   Allergic rhinitis 05/31/2014    Priority: 3.   Fatigue 11/09/2013    Priority: 3.   Dizziness 08/23/2013    Priority: 3.   RLS (restless legs syndrome) 01/03/2013    Priority:  3.   Chronic bilateral low back pain with left-sided sciatica 10/09/2011    Priority: 3.   OSTEOARTHRITIS, LOWER LEG, LEFT 10/04/2009    Priority: 3.   ANEMIA, IRON DEFICIENCY 11/06/2008    Priority: 3.   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   OSA (obstructive sleep apnea) 11/02/2006    Priority: 3.   GERD 10/12/2006    Priority: 3.   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 ([place]) 09/25/2020   Primary osteoarthritis of left knee 06/18/2020   Pre-operative respiratory examination 06/05/2020   Coronary artery disease involving coronary bypass graft of native heart without angina pectoris 04/07/2020   Trigger thumb, left thumb 01/24/2020   Left lateral epicondylitis 09/05/2019   Degenerative arthritis of left knee 08/08/2019   Educated about COVID-19 virus infection 04/20/2019   Unstable angina (HCC)    Greater trochanteric bursitis of left hip 10/27/2018   Degenerative disc disease, lumbar 10/27/2018   Abdominal muscle strain, initial encounter 06/16/2018   Weakness of left leg 06/21/2017   Hereditary and idiopathic peripheral neuropathy 09/25/2014   Generalized abdominal pain 01/31/2009    Medications- reviewed and updated Current Outpatient Medications  Medication Sig Dispense Refill   acetaminophen (TYLENOL) 500 MG tablet Take 1,000 mg by  mouth every 6 (six) hours as needed for moderate pain or headache.     albuterol (PROAIR HFA) 108 (90 BASE) MCG/ACT inhaler Inhale 1-2 puffs into the lungs every 6 (six) hours as needed for wheezing or shortness of breath. 18 g 3   amLODipine (NORVASC) 5 MG tablet Take 1 tablet (5 mg total) by mouth daily. 90 tablet 3   aspirin EC 81 MG tablet Take 1 tablet (81 mg total) by mouth 2 (two) times daily after a meal. 30 tablet 11   cholecalciferol (VITAMIN D3) 25 MCG (1000 UNIT) tablet Take 1,000 Units by mouth daily.     clotrimazole-betamethasone (LOTRISONE) cream Apply 1 application topically See admin  instructions. Twice daily every 4 days     Cyanocobalamin (B-12) 2500 MCG TABS Take 1,250 mcg by mouth daily.     diazepam (VALIUM) 5 MG tablet Take 1 tablet (5 mg total) by mouth at bedtime as needed (sleep). 30 tablet 1   EPINEPHrine 0.3 mg/0.3 mL IJ SOAJ injection Inject 0.3 mg into the muscle as needed for anaphylaxis. for allergic reaction     FLOVENT HFA 110 MCG/ACT inhaler Inhale 1 puff into the lungs 2 (two) times daily.   5   fluticasone (FLONASE) 50 MCG/ACT nasal spray Place 1 spray into both nostrils 2 (two) times daily.      folic acid (FOLVITE) 244 MCG tablet Take 800 mcg by mouth daily.      gabapentin (NEURONTIN) 300 MG capsule Take 1 cap in AM, 1 cap at noon, 2 caps at bedtime (Patient taking differently: Take 300-600 mg by mouth See admin instructions. Take 300 mg in the morning, 300 mg at noon, and 600 mg at bedtime) 360 capsule 3   Glucos-Chond-Hyal Ac-Ca Fructo (MOVE FREE JOINT HEALTH ADVANCE PO) Take 1 tablet by mouth daily.     hydrochlorothiazide (HYDRODIURIL) 25 MG tablet TAKE 1 TABLET BY MOUTH EVERY DAY 90 tablet 2   ketoconazole (NIZORAL) 2 % cream Apply 1 application topically See admin instructions. Twice a day every other day     loratadine (CLARITIN) 10 MG tablet Take 10 mg by mouth at bedtime.      Miconazole Nitrate (LOTRIMIN AF DEODORANT POWDER) 2 % AERP Apply 1 spray topically daily as needed (jock itch).     nitroGLYCERIN (NITROSTAT) 0.4 MG SL tablet PLACE 1 TABLET UNDER THE TONGUE EVERY 5 (FIVE) MINUTES AS NEEDED. (Patient taking differently: Place 0.4 mg under the tongue every 5 (five) minutes as needed for chest pain.) 75 tablet 1   nystatin-triamcinolone ointment (MYCOLOG) Apply 1 application topically See admin instructions. Twice daily every 4 days     pantoprazole (PROTONIX) 40 MG tablet TAKE 1 TABLET BY MOUTH EVERY DAY 90 tablet 3   polyethylene glycol powder (GLYCOLAX/MIRALAX) powder Take 17 g by mouth daily. 255 g 0   tiZANidine (ZANAFLEX) 4 MG tablet  Take 1 tablet (4 mg total) by mouth every 6 (six) hours as needed for muscle spasms. 40 tablet 1   valsartan (DIOVAN) 320 MG tablet Take 1 tablet (320 mg total) by mouth daily. 90 tablet 3   Wheat Dextrin (BENEFIBER) POWD Take 1 Dose by mouth daily. 1 dose = 2 teaspoons     busPIRone (BUSPAR) 5 MG tablet Take 1 tablet (5 mg total) by mouth 2 (two) times daily as needed (anxiety). 40 tablet 2   ezetimibe (ZETIA) 10 MG tablet Take 1 tablet (10 mg total) by mouth daily. 90 tablet 3   No current  facility-administered medications for this visit.     Objective:  BP 120/72   Pulse 69   Temp (!) 97.3 F (36.3 C) (Temporal)   Ht 5\' 6"  (1.676 m)   Wt 197 lb 6.4 oz (89.5 kg)   SpO2 98%   BMI 31.86 kg/m  Gen: NAD, resting comfortably Skin: warm, dry, intertriginous areas appear to have been fully healed of prior rash which was reported     Assessment and Plan   #social update- back to working out through silver sneakers. Did reclined bicycle. Woke up with pain in left knee- not swollen or red. Took diazepam and tramadol to get back to sleep- pain better this AM  #fall- tripped over pants this Am. Fell down and hurt left side but knee is ok- some pain. Hit right ear but not very hard.  Encouraged him to sit down to put his pants on. 2nd time he has fallen with trying to put pants on.   # Burning in crotch area S: From my chart message-pt reported a burning sensation, hurtful and red in color. He also reported he tried to keep it dry however it was difficult due to mild incontinences. Denied itching. He was using two ointment creams and powder which were not helpful and started using a larger and thicker pad to help but made it worse. He wanted to know were there any oral medications to take.   From patient report today-he states was much worse when he originally sent message a few weeks ago. He states using silver sulfadiazine (had this from the past).  A/P: Full resolution of rash/burning in  the groin-patient has done an excellent job using prior resources with silver sulfadiazine from dermatology-congratulated him on improvement  # Depression/some anxiety S: Medication: In general has not tolerated medication in the past-does not tolerate SSRIs due to fatigue/sleepiness.  Had dizziness on Lexapro as well.  Has declined counseling in the past-was not particular helpful in the past. - Last visit reported buspirone 5 mg was not helpful for anxiety of the 1 time that he took it but he has tried it subsequently and noted some mild improvement in anxiety A/P: PHQ-9 has been under 5 though I think he may underreport this some-we will consider partial remission.  Reasonable control-anxiety portion refilled buspirone though he is using incredibly sparingly-wanted to make sure was on his medication list if needed.  At this time does not want to explore other treatment options  #hyperlipidemia #aortic atherosclerosis  S: Medication:apparently has potential infusion upcoming- this could be costly sounds like $200 though but he wants to try. Medicine even more expensive. Still on zetia 10 mg daily.   Denies significant fatigue on Repatha.  Significant myalgias on even simvastatin which I previously tolerated Lab Results  Component Value Date   CHOL 161 10/22/2020   HDL 50 10/22/2020   LDLCALC 85 10/22/2020   LDLDIRECT 115.0 06/04/2016   TRIG 147 10/22/2020   CHOLHDL 3.2 10/22/2020   A/P: We discussed LDL goal 70 or less for aortic atherosclerosis as well as CAD-was above this on last visit.  He sounds like he is getting approved for an infusion-certainly hopeful this will be helpful.  Continue Zetia at this time  Recommended follow up: Keep November visit Future Appointments  Date Time Provider [place]  01/08/2021  8:30 AM Cameron Sprang, MD [facility] None  02/06/2021  8:40 AM [name], MD [facility]  05/13/2021  9:40 AM [name],  MD [facility]  05/15/2021   9:30 AM [facility] HEALTH COACH [facility]  05/26/2021  8:15 AM Hilty, Nadean Corwin, MD [facility]   Lab/Order associations:   ICD-10-CM   1. Groin rash  R21     2. Fall, initial encounter  W19.XXXA     3. Aortic atherosclerosis (HCC) Chronic I70.0     4. Depression, major, single episode, in partial remission ([place])  F32.4     5. Hyperlipidemia, unspecified hyperlipidemia type  E78.5       Meds ordered this encounter  Medications   busPIRone (BUSPAR) 5 MG tablet    Sig: Take 1 tablet (5 mg total) by mouth 2 (two) times daily as needed (anxiety).    Dispense:  40 tablet    Refill:  2    I,Jada Bradford,acting as a scribe for [name], MD.,have documented all relevant documentation on the behalf of [name], MD,as directed by  [name], MD while in the presence of [name], MD.  I, [name], MD, have reviewed all documentation for this visit. The documentation on 01/01/21 for the exam, diagnosis, procedures, and orders are all accurate and complete.  Return precautions advised.  [name], MD

## 2021-01-01 NOTE — Telephone Encounter (Signed)
Spoke with patient about Joshua [name], his benefits, and explained message. He would like to see if he may can get patient assistance. Website sent via [employer]

## 2021-01-01 NOTE — Telephone Encounter (Signed)
Error

## 2021-01-02 DIAGNOSIS — G4733 Obstructive sleep apnea (adult) (pediatric): Secondary | ICD-10-CM | POA: Diagnosis not present

## 2021-01-02 NOTE — Telephone Encounter (Signed)
Call made to [employer] (phone: [phone]) and was informed that Joshua [name] is a covered drug on his plan, will be subject to 20% co-insurance until he meets the OOP max, at which time will be covered at 100%

## 2021-01-02 NOTE — Telephone Encounter (Signed)
Pt states he was advised that his insurance coverage will pay 20% for Leqvio, he reached out to his coverage company and they advised pt they will not pay any of the cost. Pt wants to know what are the next steps moving forward. Please advise pt further

## 2021-01-02 NOTE — Telephone Encounter (Signed)
Spoke with patient of Dr. Debara Pickett. He said he called [employer] and was told they will cover nothing for the cost of Leqvio, thus he would be responsible for ~$6000 that [employer] will bill. This is different that the Leqvio benefits investigation form that I received, which states he has a 20% co-insurance until his $3450 OOP max is met.   He is wanting advice from MD on next steps: -- he is willing to resume simvastatin -- states the leg pain was only in the left leg, the same leg he had knee surgery back in the spring -- he is willing to re-trial PCSK9i - only tried Repatha, not Praluent -- he is willing to pursue patient assistance but will want to wait until 2023, as generally any assistance program approvals are only valid thru end of calendar year -- he reports his taxable income would meet criteria but he has income from other sources from stocks so is not sure how that will impact the assistance program  Advised him I will call [employer] (phone: [phone]) to see if I can gather more info on the coverage  Will route to MD to advice on medication next steps

## 2021-01-03 DIAGNOSIS — H25013 Cortical age-related cataract, bilateral: Secondary | ICD-10-CM | POA: Diagnosis not present

## 2021-01-03 DIAGNOSIS — H2513 Age-related nuclear cataract, bilateral: Secondary | ICD-10-CM | POA: Diagnosis not present

## 2021-01-03 DIAGNOSIS — H5203 Hypermetropia, bilateral: Secondary | ICD-10-CM | POA: Diagnosis not present

## 2021-01-03 NOTE — Telephone Encounter (Signed)
Obviously this medication will be expensive for him - at least $1200 minimum - if he is willing to retry PCSK9i, we could try Praluent 150 mg q2 weeks.  Dr Lemmie Evens

## 2021-01-07 ENCOUNTER — Telehealth: Payer: Self-pay | Admitting: Internal Medicine

## 2021-01-07 NOTE — Telephone Encounter (Signed)
New Message:     Patient would like  for Jenna E. To please give him a call please, He said he have been talking back and forth with her.

## 2021-01-08 ENCOUNTER — Other Ambulatory Visit: Payer: Self-pay

## 2021-01-08 ENCOUNTER — Encounter: Payer: Self-pay | Admitting: Neurology

## 2021-01-08 ENCOUNTER — Telehealth: Payer: Self-pay | Admitting: Internal Medicine

## 2021-01-08 ENCOUNTER — Ambulatory Visit: Payer: PPO | Admitting: Neurology

## 2021-01-08 VITALS — BP 112/71 | HR 80 | Ht 66.0 in | Wt 203.2 lb

## 2021-01-08 DIAGNOSIS — G609 Hereditary and idiopathic neuropathy, unspecified: Secondary | ICD-10-CM

## 2021-01-08 DIAGNOSIS — G5622 Lesion of ulnar nerve, left upper limb: Secondary | ICD-10-CM | POA: Diagnosis not present

## 2021-01-08 DIAGNOSIS — M25562 Pain in left knee: Secondary | ICD-10-CM

## 2021-01-08 NOTE — Telephone Encounter (Signed)
Spoke with patient. Documented in another telephone encounter

## 2021-01-08 NOTE — Telephone Encounter (Signed)
This Probation officer has been in communication with [employer] about Leqvio/cost/etc  MD noted: January 03, 2021 Joshua Casino, MD to Me      9:43 PM Note Obviously this medication will be expensive for him - at least $1200 minimum - if he is willing to retry PCSK9i, we could try Praluent 150 mg q2 weeks.   Dr H     Patient wishes to try Praluent 150mg /mL -- sample provided  He reports SE on alternative PCSK9i, Repatha, started about 24hrs post-injection.   He plans to follow up after he gives initial dose. If tolerated will seek approval for Praluent from [employer] insurance

## 2021-01-08 NOTE — Patient Instructions (Addendum)
Always good to see you. Continue Gabapentin 300mg : Take 1 cap in AM, 1 cap at noon, 2 caps at bedtime. We will send referral to Pain Management to hopefully help better. Follow-up in 3 months, call for any changes.

## 2021-01-08 NOTE — Telephone Encounter (Signed)
Spoke with patient of Dr. Debara Pickett regarding Joshua [name]   He said he spoke with an Actor (not with [employer]) and was told we could request a tier exception of sorts so that [employer] would cover Leqvio at a higher % than 80%. Advised I have not heard of this before but could call [employer] to inquire about this.   Patient then states he would like to try alternative PCSK9i as suggested by Dr. Debara Pickett -- Praluent 150mg /mL  Advised will check on sample and let him know if we have in the office for him to pick up
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
## 2021-01-09 ENCOUNTER — Ambulatory Visit: Payer: PPO | Admitting: Family Medicine

## 2021-01-09 NOTE — Telephone Encounter (Signed)
Patient aware sample has been left at front desk for pick up -- per 01/08/21 phone call

## 2021-01-10 ENCOUNTER — Other Ambulatory Visit: Payer: Self-pay | Admitting: Internal Medicine

## 2021-01-10 DIAGNOSIS — J301 Allergic rhinitis due to pollen: Secondary | ICD-10-CM | POA: Diagnosis not present

## 2021-01-10 DIAGNOSIS — J3089 Other allergic rhinitis: Secondary | ICD-10-CM | POA: Diagnosis not present

## 2021-01-10 DIAGNOSIS — J3081 Allergic rhinitis due to animal (cat) (dog) hair and dander: Secondary | ICD-10-CM | POA: Diagnosis not present

## 2021-01-10 MED ORDER — PRALUENT 150 MG/ML ~~LOC~~ SOAJ: 1.0000 | SUBCUTANEOUS | 11 refills | Status: DC

## 2021-01-10 NOTE — Addendum Note (Signed)
Addended by: Fidel Levy on: 01/10/2021 03:13 PM   Modules accepted: Orders

## 2021-01-11 ENCOUNTER — Other Ambulatory Visit: Payer: Self-pay | Admitting: Internal Medicine

## 2021-01-13 ENCOUNTER — Telehealth: Payer: Self-pay | Admitting: Internal Medicine

## 2021-01-13 NOTE — Telephone Encounter (Signed)
Patient is going to drop of an envelope with some information but would like to speak to speak to nurse about it first.

## 2021-01-13 NOTE — Telephone Encounter (Signed)
Spoke with patient who reports he dropped off patient assistance application for Praluent. Advised will submit via fax  He needs Praluent PA - will work on this

## 2021-01-14 ENCOUNTER — Telehealth: Payer: Self-pay | Admitting: Internal Medicine

## 2021-01-14 NOTE — Telephone Encounter (Signed)
(  Key: [id number]) - [id number]

## 2021-01-14 NOTE — Telephone Encounter (Signed)
PA for praluent 150mg /mL submitted via CMM

## 2021-01-15 NOTE — Progress Notes (Signed)
Addendum: Reviewed and agree with assessment and management plan. Colonoscopy should be considered strongly, and this can be discussed when he is seen next in a few weeks. Clovis Mankins, Lajuan Lines, MD

## 2021-01-16 ENCOUNTER — Telehealth: Payer: Self-pay | Admitting: Neurology

## 2021-01-16 NOTE — Telephone Encounter (Signed)
Pt was given the number to where referral was placed

## 2021-01-17 NOTE — Telephone Encounter (Signed)
PA Case: [id number], Status: Approved, Coverage Starts on: 01/14/2021 12:00:00 AM, Coverage Ends on: 03/22/2022 12:00:00 AM.

## 2021-01-20 ENCOUNTER — Telehealth: Payer: Self-pay | Admitting: Neurology

## 2021-01-20 DIAGNOSIS — M25462 Effusion, left knee: Secondary | ICD-10-CM | POA: Diagnosis not present

## 2021-01-20 NOTE — Telephone Encounter (Signed)
Pt called in stating he called the pain clinic he was referred to, but they still had not processed the referral and said they were not very nice to him. He would like for someone to call them and try and get them to took at it.

## 2021-01-21 DIAGNOSIS — J3081 Allergic rhinitis due to animal (cat) (dog) hair and dander: Secondary | ICD-10-CM | POA: Diagnosis not present

## 2021-01-21 DIAGNOSIS — J301 Allergic rhinitis due to pollen: Secondary | ICD-10-CM | POA: Diagnosis not present

## 2021-01-21 DIAGNOSIS — J3089 Other allergic rhinitis: Secondary | ICD-10-CM | POA: Diagnosis not present

## 2021-01-21 DIAGNOSIS — J4530 Mild persistent asthma, uncomplicated: Secondary | ICD-10-CM | POA: Diagnosis not present

## 2021-01-22 ENCOUNTER — Telehealth: Payer: Self-pay | Admitting: Internal Medicine

## 2021-01-22 NOTE — Telephone Encounter (Signed)
Patient assistance application for [place] faxed to [phone]

## 2021-01-23 ENCOUNTER — Encounter: Payer: Self-pay | Admitting: Physical Medicine & Rehabilitation

## 2021-01-23 NOTE — Telephone Encounter (Signed)
[employer] have opened [employer] with patient and assisted in applying for grant for Praluent He is approved from 12/24/20 - 12/23/21 for $2500  CARD NO. [id number]   CARD STATUS OK   BIN [id number]   PCN [id number]   PC GROUP [id number]

## 2021-01-26 DIAGNOSIS — R1084 Generalized abdominal pain: Secondary | ICD-10-CM | POA: Diagnosis not present

## 2021-01-26 DIAGNOSIS — R140 Abdominal distension (gaseous): Secondary | ICD-10-CM | POA: Diagnosis not present

## 2021-01-29 ENCOUNTER — Ambulatory Visit: Payer: PPO | Admitting: Gastroenterology

## 2021-01-29 ENCOUNTER — Encounter: Payer: Self-pay | Admitting: Gastroenterology

## 2021-01-29 VITALS — BP 110/66 | HR 80 | Ht 65.25 in | Wt 202.2 lb

## 2021-01-29 DIAGNOSIS — R1084 Generalized abdominal pain: Secondary | ICD-10-CM | POA: Diagnosis not present

## 2021-01-29 DIAGNOSIS — R140 Abdominal distension (gaseous): Secondary | ICD-10-CM

## 2021-01-29 DIAGNOSIS — R1912 Hyperactive bowel sounds: Secondary | ICD-10-CM

## 2021-01-29 NOTE — Progress Notes (Signed)
01/29/2021 Joshua [name] [id number] 06/08/1940   HISTORY OF PRESENT ILLNESS:  This is a 80 year old male who is a patient of Dr. Vena Rua with history of IBS who was seen by me 1 month ago for complaints of generalized abdominal pain/discomfort with bloating, hyperactivity, and extensive borborygmi.  He had a negative CT scan.  He had wanted to defer colonoscopy.  We proceeded to order a SIBO breath test.  He had contacted our office saying that he did not know if he could proceed with that due to the fasting issues, but he did end up having that performed and just returned to into the mail on Monday.  He says that overall he had been feeling okay for several days, but this morning was having some symptoms again.  Having some bloating, gas and excessive noise and generalized discomfort.  Weight is stable.  He has not had any rectal bleeding.  Had not had any success with Gas-X and several different things over-the-counter.  He tried IBgard with minimal to no benefit.  He is taking MiraLAX twice a day and Benefiber and says that he is moving his bowels.  Upper endoscopy in September 2010 showing a hiatal hernia and otherwise normal exam and also had colonoscopy at that same time showing moderate diverticulosis throughout the colon, no polyps and was to have a 10 year interval follow-up.  Past Medical History:  Diagnosis Date   Acute medial meniscal tear    Anemia 2015   Arthritis    "middle finger right hand; right knee; neck" (02/06/2014)   Asthma    seasonal   Bladder cancer ([place]) 04/2010   "cauterized during prostate OR"   CAD (coronary artery disease)    CABG 2001   Diverticulosis    DJD (degenerative joint disease)    BACK   GERD (gastroesophageal reflux disease)    H/O inguinal hernia repair 12/2018   History of gout    Hyperlipidemia    Hypertension    IBS (irritable bowel syndrome)    Obesity    OSA (obstructive sleep apnea)    USES CPAP    Pancreatitis ~ 1980    Peripheral vascular disease ([place])    Pre-diabetes    Prostate cancer ([place]) 04/2010   Past Surgical History:  Procedure Laterality Date   ANTERIOR CERVICAL DECOMP/DISCECTOMY FUSION  05/26/2006   PLATE PLACED   BACK SURGERY     CARDIAC CATHETERIZATION  11/20/1999   CORONARY ARTERY BYPASS GRAFT  11/20/1999   SVG-RI1-RI2, SVG-OM, SVG-dRCA   CORONARY STENT INTERVENTION N/A 04/13/2019   Procedure: CORONARY STENT INTERVENTION;  Surgeon: Burnell Blanks, MD;  Location: [facility];  Service: Cardiovascular;  Laterality: N/A;   HERNIA REPAIR     INGUINAL HERNIA REPAIR Left 01/02/2019   Procedure: OPEN LEFT INGUINAL HERNIA REPAIR WITH MESH;  Surgeon: Johnathan Hausen, MD;  Location: [facility];  Service: General;  Laterality: Left;   KNEE ARTHROSCOPY Right 1992; 04/2008   LAPAROSCOPIC CHOLECYSTECTOMY  2007   LEFT HEART CATH AND CORS/GRAFTS ANGIOGRAPHY N/A 04/13/2019   Procedure: LEFT HEART CATH AND CORS/GRAFTS ANGIOGRAPHY;  Surgeon: Burnell Blanks, MD;  Location: [facility];  Service: Cardiovascular;  Laterality: N/A;   NASAL SEPTUM SURGERY Right 2007   ROBOT ASSISTED LAPAROSCOPIC RADICAL PROSTATECTOMY  04/2010   THYROIDECTOMY, PARTIAL  1980   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   Procedure: LEFT TOTAL KNEE ARTHROPLASTY;  Surgeon: Melrose Nakayama, MD;  Location: [facility];  Service:  Orthopedics;  Laterality: Left;    reports that he quit smoking about 28 years ago. His smoking use included cigarettes. He has a 35.00 pack-year smoking history. He has never used smokeless tobacco. He reports current alcohol use of about 2.0 standard drinks per week. He reports that he does not use drugs. family history includes Diabetes in his brother; Heart disease in his father and mother; Hyperlipidemia in his father and mother. Allergies  Allergen Reactions   Benadryl [Diphenhydramine] Other (See Comments)    Pt told not to take because of bypass surgery   Bromfed Nausea And Vomiting    Clarithromycin Other (See Comments)     gastritis   Codeine Other (See Comments)    Trouble breathing   Doxycycline Hyclate Nausea And Vomiting   Lexapro [Escitalopram]     Dizziness   Modafinil Other (See Comments)     anxiety-nervousness   Oxycodone-Acetaminophen Itching   Promethazine Hcl Other (See Comments)     fainting   Quinolones Nausea Only    Cipro "felt real bad"   Telithromycin Nausea And Vomiting   Tramadol Nausea Only   Hydrocodone-Acetaminophen Rash   Statins Other (See Comments)    Leg cramps and makes patient feel bad. Per pt tried multiple in years past   Sulfonamide Derivatives Rash      Outpatient Encounter Medications as of 01/29/2021  Medication Sig   acetaminophen (TYLENOL) 500 MG tablet Take 1,000 mg by mouth every 6 (six) hours as needed for moderate pain or headache.   albuterol (PROAIR HFA) 108 (90 BASE) MCG/ACT inhaler Inhale 1-2 puffs into the lungs every 6 (six) hours as needed for wheezing or shortness of breath.   Alirocumab (PRALUENT) 150 MG/ML SOAJ INJECT 1 DOSE INTO THE SKIN EVERY 14 (FOURTEEN) DAYS.   amLODipine (NORVASC) 5 MG tablet Take 1 tablet (5 mg total) by mouth daily.   aspirin EC 81 MG tablet Take 1 tablet (81 mg total) by mouth 2 (two) times daily after a meal.   busPIRone (BUSPAR) 5 MG tablet Take 1 tablet (5 mg total) by mouth 2 (two) times daily as needed (anxiety).   cholecalciferol (VITAMIN D3) 25 MCG (1000 UNIT) tablet Take 1,000 Units by mouth daily.   clotrimazole-betamethasone (LOTRISONE) cream Apply 1 application topically See admin instructions. Twice daily every 4 days   Cyanocobalamin (B-12) 2500 MCG TABS Take 1,250 mcg by mouth daily.   diazepam (VALIUM) 5 MG tablet Take 1 tablet (5 mg total) by mouth at bedtime as needed (sleep).   FLOVENT HFA 110 MCG/ACT inhaler Inhale 1 puff into the lungs 2 (two) times daily.    fluticasone (FLONASE) 50 MCG/ACT nasal spray Place 1 spray into both nostrils 2 (two) times daily.     folic acid (FOLVITE) 299 MCG tablet Take 800 mcg by mouth daily.    gabapentin (NEURONTIN) 300 MG capsule Take 1 cap in AM, 1 cap at noon, 2 caps at bedtime (Patient taking differently: Take 300-600 mg by mouth See admin instructions. Take 300 mg in the morning, 300 mg at noon, and 600 mg at bedtime)   Glucos-Chond-Hyal Ac-Ca Fructo (MOVE FREE JOINT HEALTH ADVANCE PO) Take 1 tablet by mouth daily.   hydrochlorothiazide (HYDRODIURIL) 25 MG tablet TAKE 1 TABLET BY MOUTH EVERY DAY   ketoconazole (NIZORAL) 2 % cream Apply 1 application topically See admin instructions. Twice a day every other day   loratadine (CLARITIN) 10 MG tablet Take 10 mg by mouth at bedtime.    Miconazole  Nitrate (LOTRIMIN AF DEODORANT POWDER) 2 % AERP Apply 1 spray topically daily as needed (jock itch).   NON FORMULARY Take 1 tablet by mouth 2 (two) times daily. Bi-Guard   nystatin-triamcinolone ointment (MYCOLOG) Apply 1 application topically See admin instructions. Twice daily every 4 days   pantoprazole (PROTONIX) 40 MG tablet TAKE 1 TABLET BY MOUTH EVERY DAY   polyethylene glycol powder (GLYCOLAX/MIRALAX) powder Take 17 g by mouth daily.   tiZANidine (ZANAFLEX) 4 MG tablet Take 1 tablet (4 mg total) by mouth every 6 (six) hours as needed for muscle spasms.   valsartan (DIOVAN) 320 MG tablet Take 1 tablet (320 mg total) by mouth daily.   Wheat Dextrin (BENEFIBER) POWD Take 1 Dose by mouth daily. 1 dose = 2 teaspoons   EPINEPHrine 0.3 mg/0.3 mL IJ SOAJ injection Inject 0.3 mg into the muscle as needed for anaphylaxis. for allergic reaction (Patient not taking: Reported on 01/29/2021)   nitroGLYCERIN (NITROSTAT) 0.4 MG SL tablet PLACE 1 TABLET UNDER THE TONGUE EVERY 5 (FIVE) MINUTES AS NEEDED. (Patient not taking: Reported on 01/29/2021)   [DISCONTINUED] ezetimibe (ZETIA) 10 MG tablet Take 1 tablet (10 mg total) by mouth daily.   No facility-administered encounter medications on file as of 01/29/2021.     REVIEW OF SYSTEMS  :  All other systems reviewed and negative except where noted in the History of Present Illness.   PHYSICAL EXAM: BP 110/66 (BP Location: Left Arm, Patient Position: Sitting, Cuff Size: Normal)   Pulse 80   Ht 5' 5.25" (1.657 m) Comment: height measured without shoes  Wt 202 lb 4 oz (91.7 kg)   BMI 33.40 kg/m  General: Well developed white male in no acute distress Head: Normocephalic and atraumatic Eyes:  Sclerae anicteric, conjunctiva pink. Ears: Normal auditory acuity Lungs: Clear throughout to auscultation; no W/R/R. Heart: Regular rate and rhythm; no M/R/G. Abdomen: Soft, non-distended.  BS present.  Mild left sided TTP. Musculoskeletal: Symmetrical with no gross deformities  Skin: No lesions on visible extremities Extremities: No edema  Neurological: Alert oriented x 4, grossly non-focal Psychological:  Alert and cooperative. Normal mood and affect  ASSESSMENT AND PLAN: *80 year old male with history of IBS who was seen by me 1 month ago for complaints of generalized abdominal pain/discomfort with bloating, hyperactivity, and extensive borborygmi.  He had a negative CT scan.  He had wanted to defer colonoscopy.  We proceeded to order a SIBO breath test.  He had contacted our office saying that he did not know if he could proceed with that due to the fasting issues, but he did end up having that performed and just returned to into the mail on Monday.  He says that overall he had been feeling okay for several days, but this morning was having some symptoms again.  He still wants to defer colonoscopy for now.  We will await the results of the SIBO breath test.  If it is abnormal then we will treat.  If it is normal then I advised next step for ongoing symptoms with be colonoscopy.   CC:  [name], MD

## 2021-01-29 NOTE — Patient Instructions (Signed)
If you are age 78 or older, your body mass index should be between 23-30. Your Body mass index is 33.4 kg/m. If this is out of the aforementioned range listed, please consider follow up with your Primary Care Provider.  If you are age 35 or younger, your body mass index should be between 19-25. Your Body mass index is 33.4 kg/m. If this is out of the aformentioned range listed, please consider follow up with your Primary Care Provider.   The [place] [facility] providers would like to encourage you to use [facility] to communicate with providers for non-urgent requests or questions.  Due to long hold times on the telephone, sending your provider a message by [facility] may be faster and more efficient way to get a response. Please allow 48 business hours for a response.  Please remember that this is for non-urgent requests/questions.  We will be in touch with you once the SIBO breath test comes in.  It was great seeing you today! Thank you for entrusting me with your care and choosing [facility].  Alonza Bogus, PA-C

## 2021-01-30 ENCOUNTER — Other Ambulatory Visit: Payer: Self-pay | Admitting: Internal Medicine

## 2021-02-03 ENCOUNTER — Telehealth: Payer: Self-pay | Admitting: Cardiology

## 2021-02-03 DIAGNOSIS — G4733 Obstructive sleep apnea (adult) (pediatric): Secondary | ICD-10-CM | POA: Diagnosis not present

## 2021-02-03 NOTE — Progress Notes (Signed)
Addendum: Reviewed and agree with assessment and management plan.  We will await SIBO breath testing, this will need to be followed up  Frederico Gerling, Lajuan Lines, MD

## 2021-02-03 NOTE — Telephone Encounter (Signed)
Patient called in as he is having chest tightness in the center to the right side of his chest. He took 1 nitro with some relief from chest pain and tightness across right chest and right jaw for pain 10 our of 10 and that relieved him to a pain of 2. He is still having the tightness and jaw pain on the right.  Patient stated he did move some small furniture around this morning and had breakfast and lunch like normal but still does not feel right. No c/o shortness of breath for dizziness outside of the normal. Patient stated he was able to burp and has had some relief. Requested patient to rest and drink water to see if continues to subside. He asked if he could take antacid which agreed to see if that will help. Told patient if after doing so he does not have relief when I call him back would recommend to go to the ED by calling 911. Called patient back after 15 minutes, he stated he feel "so much better". Chest tightness is gone, jaw pain still there but getting better. Made patient aware of foods to stay away from and that if this happens again that he should call 911 and go to the emergency room. Explained that though the antiacids helped and he has been experiencing GI symptoms over the past few months, it would be best to get a cardiac workup since the Nitro did have some effect on his symptoms. Patient and wife agreed and no additional questions at this time.

## 2021-02-03 NOTE — Telephone Encounter (Signed)
Pt c/o of Chest Pain: STAT if CP now or developed within 24 hours  1. Are you having CP right now?  No, but patient has tightness in his chest   2. Are you experiencing any other symptoms (ex. SOB, nausea, vomiting, sweating)?  Chest tightness, right jaw pain  3. How long have you been experiencing CP?  CP developed this morning   4. Is your CP continuous or coming and going?  Coming and going   5. Have you taken Nitroglycerin?   Patient states he took a nitro and pain subsided, but he is still having tightness. ?

## 2021-02-05 ENCOUNTER — Telehealth: Payer: Self-pay

## 2021-02-05 ENCOUNTER — Telehealth: Payer: Self-pay | Admitting: Internal Medicine

## 2021-02-05 NOTE — Telephone Encounter (Signed)
Spoke with patient who reports [employer] assistance program has been trying to get in touch with him about paperwork needed for his application. He said he has talked w/them multiple times and they need different stuff each time. Advised that his app was faxed prior to his [employer]. Advised that since he has this, just use the [employer] for co-pays. He agreed w/plan  Discussed zetia per 11/6 note -- advised he hold a full 2 weeks as advised by Erasmo Downer PharmD and call if symptoms return once he re-trials med.   He voiced understanding.

## 2021-02-05 NOTE — Telephone Encounter (Signed)
-----   Message from Loralie Champagne, PA-C sent at 02/05/2021 12:38 PM EST ----- Please let him know that the results of his SIBO breath test were negative.  As we discussed, I think the next step to rule out any other major issues would be to proceed with colonoscopy if he is agreeable.  Please schedule Dr. Hilarie Fredrickson if so.  Thank you,  Jess

## 2021-02-05 NOTE — Telephone Encounter (Signed)
Patient is calling about Alirocumab (PRALUENT) 150 MG/ML SOAJ.  Needs to speak to nurse about this.

## 2021-02-05 NOTE — Telephone Encounter (Signed)
Called patient and gave results of his SIBO. He states he thinks his abdominal pain is from Ezetimibe that he has been taking for his cholesterol. He called his Dr. Parks Ranger prescribed it and they suggested he stop it for 2 weeks and see what happens. He has not had abdominal pain since stopping the medication. He will start back next week and see if his pain comes back. States he will call back and schedule colonoscopy, if it is not caused by the medication

## 2021-02-06 ENCOUNTER — Ambulatory Visit (INDEPENDENT_AMBULATORY_CARE_PROVIDER_SITE_OTHER): Payer: PPO | Admitting: Family Medicine

## 2021-02-06 ENCOUNTER — Other Ambulatory Visit: Payer: Self-pay

## 2021-02-06 ENCOUNTER — Encounter: Payer: Self-pay | Admitting: Family Medicine

## 2021-02-06 VITALS — BP 112/64 | HR 79 | Temp 97.6°F | Ht 65.25 in | Wt 199.6 lb

## 2021-02-06 DIAGNOSIS — R739 Hyperglycemia, unspecified: Secondary | ICD-10-CM

## 2021-02-06 DIAGNOSIS — G609 Hereditary and idiopathic neuropathy, unspecified: Secondary | ICD-10-CM

## 2021-02-06 DIAGNOSIS — J3081 Allergic rhinitis due to animal (cat) (dog) hair and dander: Secondary | ICD-10-CM | POA: Diagnosis not present

## 2021-02-06 DIAGNOSIS — E785 Hyperlipidemia, unspecified: Secondary | ICD-10-CM

## 2021-02-06 DIAGNOSIS — I2510 Atherosclerotic heart disease of native coronary artery without angina pectoris: Secondary | ICD-10-CM

## 2021-02-06 DIAGNOSIS — I10 Essential (primary) hypertension: Secondary | ICD-10-CM | POA: Diagnosis not present

## 2021-02-06 DIAGNOSIS — J3089 Other allergic rhinitis: Secondary | ICD-10-CM | POA: Diagnosis not present

## 2021-02-06 DIAGNOSIS — J301 Allergic rhinitis due to pollen: Secondary | ICD-10-CM | POA: Diagnosis not present

## 2021-02-06 LAB — COMPREHENSIVE METABOLIC PANEL
ALT: 19 U/L (ref 0–53)
AST: 19 U/L (ref 0–37)
Albumin: 4.1 g/dL (ref 3.5–5.2)
Alkaline Phosphatase: 60 U/L (ref 39–117)
BUN: 17 mg/dL (ref 6–23)
CO2: 31 mEq/L (ref 19–32)
Calcium: 9.8 mg/dL (ref 8.4–10.5)
Chloride: 101 mEq/L (ref 96–112)
Creatinine, Ser: 1.21 mg/dL (ref 0.40–1.50)
GFR: 57.56 mL/min — ABNORMAL LOW (ref >60.00)
Glucose, Bld: 76 mg/dL (ref 70–99)
Potassium: 3.9 mEq/L (ref 3.5–5.1)
Sodium: 136 mEq/L (ref 135–145)
Total Bilirubin: 0.7 mg/dL (ref 0.2–1.2)
Total Protein: 6.8 g/dL (ref 6.0–8.3)

## 2021-02-06 LAB — CBC WITH DIFFERENTIAL/PLATELET
Basophils Absolute: 0 10*3/uL (ref 0.0–0.1)
Basophils Relative: 0.5 % (ref 0.0–3.0)
Eosinophils Absolute: 0.2 10*3/uL (ref 0.0–0.7)
Eosinophils Relative: 3 % (ref 0.0–5.0)
HCT: 41.9 % (ref 39.0–52.0)
Hemoglobin: 14.2 g/dL (ref 13.0–17.0)
Lymphocytes Relative: 17.2 % (ref 12.0–46.0)
Lymphs Abs: 1.1 10*3/uL (ref 0.7–4.0)
MCHC: 33.8 g/dL (ref 30.0–36.0)
MCV: 90.3 fl (ref 78.0–100.0)
Monocytes Absolute: 0.7 10*3/uL (ref 0.1–1.0)
Monocytes Relative: 11.3 % (ref 3.0–12.0)
Neutro Abs: 4.3 10*3/uL (ref 1.4–7.7)
Neutrophils Relative %: 68 % (ref 43.0–77.0)
Platelets: 258 10*3/uL (ref 150.0–400.0)
RBC: 4.64 Mil/uL (ref 4.22–5.81)
RDW: 12.7 % (ref 11.5–15.5)
WBC: 6.3 10*3/uL (ref 4.0–10.5)

## 2021-02-06 LAB — HEMOGLOBIN A1C: Hgb A1c MFr Bld: 5.7 % (ref 4.6–6.5)

## 2021-02-06 NOTE — Patient Instructions (Addendum)
Please stop by lab before you go If you have mychart- we will send your results within 3 business days of [place] receiving them.  If you do not have mychart- we will call you about results within 5 business days of [place] receiving them.  *please also note that you will see labs on mychart as soon as they post. I will later go in and write notes on them- will say "notes from Dr. Yong Channel"  Recommended follow up: Return in about 3 months (around 05/09/2021) for physical or sooner if needed. Double check at desk - I think your 05/13/21 visit is a physical

## 2021-02-06 NOTE — Progress Notes (Signed)
Phone [phone] In person visit   Subjective:   Joshua [name] is a 80 y.o. year old very pleasant male patient who presents for/with See problem oriented charting Chief Complaint  Patient presents with   Follow-up   Hypertension   Hyperlipidemia    This visit occurred during the SARS-CoV-2 public health emergency.  Safety protocols were in place, including screening questions prior to the visit, additional usage of staff PPE, and extensive cleaning of exam room while observing appropriate contact time as indicated for disinfecting solutions.   Past Medical History-  Patient Active Problem List   Diagnosis Date Noted   Coronary atherosclerosis 10/12/2006    Priority: High   Irritable bowel syndrome 10/12/2006    Priority: High   Overactive bladder 11/24/2019    Priority: Medium    Hyperglycemia 10/07/2017    Priority: Medium    Gout 11/20/2016    Priority: Medium    CKD (chronic kidney disease), stage III ([place]) 10/23/2013    Priority: Medium    Neuropathy ([place]) 08/21/2013    Priority: Medium    History of bladder cancer 04/01/2010    Priority: Medium    Essential hypertension 02/21/2007    Priority: Medium    Hyperlipidemia 10/12/2006    Priority: Medium    Depression, major, single episode, in partial remission ([place]) 10/12/2006    Priority: Medium    Asthma 10/12/2006    Priority: Medium    Aortic atherosclerosis ([place]) 05/05/2018    Priority: Low   DJD (degenerative joint disease)     Priority: Low   Arthritis     Priority: Low   Neck pain 02/08/2017    Priority: Low   Venous insufficiency 12/29/2016    Priority: Low   Ganglion cyst of tendon sheath of right hand 12/29/2016    Priority: Low   Avulsion fracture of metatarsal bone of right foot 11/25/2016    Priority: Low   Obesity, Class I, BMI 30-34.9 10/03/2015    Priority: Low   Allergic rhinitis 05/31/2014    Priority: Low   Fatigue 11/09/2013    Priority: Low   Dizziness 08/23/2013     Priority: Low   RLS (restless legs syndrome) 01/03/2013    Priority: Low   Chronic bilateral low back pain with left-sided sciatica 10/09/2011    Priority: Low   OSTEOARTHRITIS, LOWER LEG, LEFT 10/04/2009    Priority: Low   ANEMIA, IRON DEFICIENCY 11/06/2008    Priority: Low   TINNITUS, CHRONIC, 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    Priority: Low   OSA (obstructive sleep apnea) 11/02/2006    Priority: Low   GERD 10/12/2006    Priority: Low   Hyperactive bowel sounds 01/29/2021   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 ([place]) 09/25/2020   Primary osteoarthritis of left knee 06/18/2020   Pre-operative respiratory examination 06/05/2020   Coronary artery disease involving coronary bypass graft of native heart without angina pectoris 04/07/2020   Trigger thumb, left thumb 01/24/2020   Left lateral epicondylitis 09/05/2019   Degenerative arthritis of left knee 08/08/2019   Educated about COVID-19 virus infection 04/20/2019   Unstable angina (HCC)    Greater trochanteric bursitis of left hip 10/27/2018   Degenerative disc disease, lumbar 10/27/2018   Abdominal muscle strain, initial encounter 06/16/2018   Weakness of left leg 06/21/2017   Hereditary and idiopathic peripheral neuropathy 09/25/2014   Generalized abdominal pain 01/31/2009    Medications- reviewed and updated Current Outpatient Medications  Medication Sig Dispense  Refill   acetaminophen (TYLENOL) 500 MG tablet Take 1,000 mg by mouth every 6 (six) hours as needed for moderate pain or headache.     albuterol (PROAIR HFA) 108 (90 BASE) MCG/ACT inhaler Inhale 1-2 puffs into the lungs every 6 (six) hours as needed for wheezing or shortness of breath. 18 g 3   Alirocumab (PRALUENT) 150 MG/ML SOAJ INJECT 1 DOSE INTO THE SKIN EVERY 14 (FOURTEEN) DAYS. 2 mL 11   amLODipine (NORVASC) 5 MG tablet Take 1 tablet (5 mg total) by mouth daily. 90 tablet 3   aspirin EC 81 MG tablet Take 1 tablet (81 mg total) by mouth 2 (two)  times daily after a meal. 30 tablet 11   busPIRone (BUSPAR) 5 MG tablet Take 1 tablet (5 mg total) by mouth 2 (two) times daily as needed (anxiety). 40 tablet 2   cholecalciferol (VITAMIN D3) 25 MCG (1000 UNIT) tablet Take 1,000 Units by mouth daily.     clotrimazole-betamethasone (LOTRISONE) cream Apply 1 application topically See admin instructions. Twice daily every 4 days     Cyanocobalamin (B-12) 2500 MCG TABS Take 1,250 mcg by mouth daily.     diazepam (VALIUM) 5 MG tablet Take 1 tablet (5 mg total) by mouth at bedtime as needed (sleep). 30 tablet 1   EPINEPHrine 0.3 mg/0.3 mL IJ SOAJ injection Inject 0.3 mg into the muscle as needed for anaphylaxis. for allergic reaction (Patient not taking: Reported on 01/29/2021)     FLOVENT HFA 110 MCG/ACT inhaler Inhale 1 puff into the lungs 2 (two) times daily.   5   fluticasone (FLONASE) 50 MCG/ACT nasal spray Place 1 spray into both nostrils 2 (two) times daily.      folic acid (FOLVITE) 638 MCG tablet Take 800 mcg by mouth daily.      gabapentin (NEURONTIN) 300 MG capsule Take 1 cap in AM, 1 cap at noon, 2 caps at bedtime (Patient taking differently: Take 300-600 mg by mouth See admin instructions. Take 300 mg in the morning, 300 mg at noon, and 600 mg at bedtime) 360 capsule 3   Glucos-Chond-Hyal Ac-Ca Fructo (MOVE FREE JOINT HEALTH ADVANCE PO) Take 1 tablet by mouth daily.     hydrochlorothiazide (HYDRODIURIL) 25 MG tablet TAKE 1 TABLET BY MOUTH EVERY DAY 90 tablet 2   ketoconazole (NIZORAL) 2 % cream Apply 1 application topically See admin instructions. Twice a day every other day     loratadine (CLARITIN) 10 MG tablet Take 10 mg by mouth at bedtime.      Miconazole Nitrate (LOTRIMIN AF DEODORANT POWDER) 2 % AERP Apply 1 spray topically daily as needed (jock itch).     nitroGLYCERIN (NITROSTAT) 0.4 MG SL tablet PLACE 1 TABLET UNDER THE TONGUE EVERY 5 (FIVE) MINUTES AS NEEDED. (Patient not taking: Reported on 01/29/2021) 75 tablet 1    nystatin-triamcinolone ointment (MYCOLOG) Apply 1 application topically See admin instructions. Twice daily every 4 days     pantoprazole (PROTONIX) 40 MG tablet TAKE 1 TABLET BY MOUTH EVERY DAY 90 tablet 3   polyethylene glycol powder (GLYCOLAX/MIRALAX) powder Take 17 g by mouth daily. 255 g 0   tiZANidine (ZANAFLEX) 4 MG tablet Take 1 tablet (4 mg total) by mouth every 6 (six) hours as needed for muscle spasms. 40 tablet 1   valsartan (DIOVAN) 320 MG tablet Take 1 tablet (320 mg total) by mouth daily. 90 tablet 3   Wheat Dextrin (BENEFIBER) POWD Take 1 Dose by mouth daily. 1 dose = 2  teaspoons     No current facility-administered medications for this visit.     Objective:  BP 112/64   Pulse 79   Temp 97.6 F (36.4 C)   Ht 5' 5.25" (1.657 m)   Wt 199 lb 9.6 oz (90.5 kg)   SpO2 99%   BMI 32.96 kg/m  Gen: NAD, resting comfortably CV: RRR no murmurs rubs or gallops Lungs: CTAB no crackles, wheeze, rhonchi Ext: trace edema left worse than right Skin: warm, dry    Assessment and Plan   #Prior falls-has had 2 falls with trying to get his pants on-have encouraged him to sit down to put his pants on-he reports trying to transition to this- occasionally still standing   # Bloating  S:Patient was seen in Gastroenterology by Alonza Bogus, PA-C on 01/29/2021. He had seen her about 1 month ago with complaints of generalized abdominal pain/discomfort with bloating, hyperactivity, and extensive borborygmi. CT scan was negative and patient wanted to defer a colonoscopy. SIBO breath test was to be proceeded but he did not know if he could proceed with that due to the fasting issues - did end up having that performed and just returned to into the mail on 11/7. Overall patient felt okay except for that morning when he experienced symptoms come back. No rectal bleeding noted. Tried Gas-X and several different things over-the-counter with no success - also used Ibgard with minimal to no benefit. Now  taking He is taking MiraLAX twice a day and Benefiber and stated that he were moving his bowels. - Upper endoscopy in September 2010 showing a hiatal hernia and otherwise normal exam and also had colonoscopy at that same time showing moderate diverticulosis throughout the colon, no polyps and was to have a 10 year interval follow-up. Janett Billow was waiting for results of the SIBO breath test (results came back negative).  Next step for ongoing symptoms with be colonoscopy. Dr. Hilarie Fredrickson also agreed with this plan. A/P: Today patient reports stopped zetia to see if that would help (working with Dr. Debara Pickett as well) . He has noted improvement other than last night- would like to continue off for another week then retrial and if recurrent symptoms- feels he knows culprit. If not- plans for colonoscopy repeat   #CAD/hyperlipidemia-history of CABG x5- follows with Dr. Percival Spanish S: Fatigue on Repatha in 2021-restarted on simvastatin 20 mg with plan for Zetia 10 mg p.o. as well-ultimately with myalgias on this and changed to Praluent which she is currently tolerating- see above about zetia . He is compliant with aspirin  -Stent drug eluting placed 04/13/19 Dr. Angelena Form  -Current symptoms: intermittent rare chest pain most  (no worsening pattern). No shortness of breath  Lab Results  Component Value Date   CHOL 161 10/22/2020   HDL 50 10/22/2020   LDLCALC 85 10/22/2020   LDLDIRECT 115.0 06/04/2016   TRIG 147 10/22/2020   CHOLHDL 3.2 10/22/2020   A/P: For CAD- overall stable- continue current meds  For lipids-too soon for repat- has only had 2 doses of praluent- we will hold off on recheck- hoping LDL under 70- above #s not on praluent Also with aortic atherosclerosis-continue risk factor modification including Praluent   #Hypertension/CKD stage III S: Compliant with amlodipine 5 mg, hydrochlorothiazide 25 mg and valsartan 320 mg -GFR has been stable. On ARB in case proteinuric element BP Readings from Last  3 Encounters:  02/06/21 112/64  01/29/21 110/66  01/08/21 112/71  A/P:  Controlled. Continue current medications.    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 Later had dizziness on Lexapro -Has declined counseling and continues to decline counseling-has done this in the past and only somewhat helpful but had to find the right fit Depression screen [facility] 2/9 02/06/2021 11/04/2020 08/21/2020  Decreased Interest 1 0 3  Down, Depressed, Hopeless 1 1 1   PHQ - 2 Score 2 1 4   Altered sleeping 1 0 0  Tired, decreased energy 0 3 3  Change in appetite 0 0 3  Feeling bad or failure about yourself  0 0 0  Trouble concentrating 0 0 0  Moving slowly or fidgety/restless 0 0 0  Suicidal thoughts 0 0 0  PHQ-9 Score 3 4 10   Difficult doing work/chores Not difficult at all Not difficult at all Very difficult  Some recent data might be hidden  A/P: manageable with PHQ9 under 5- continue current meds- aging process hard on him plus dealing with left knee after surgery   # Neuropathy-follows with [name] S: Compliant with gabapentin. Burning/needle pain in the feet  - Patient was seen with [name] 01/08/2021 for a follow-up - he had left knee surgery back in March 2022 and has had significant pain since then. Patient was also unresponsive to pain medications prescribed by his Orthopedic surgeons. X-rays were obtained by a second orthopedic surgeon and he was told everything looked normal. Continued to however have pain localized in knee - tried to exercise knee regularly. He has neuropathic pain in both feet and in the penile region (since prostatectomy), neuropathy historically worsens in the fall/winter - he wanted to stay on current dose of gabapentin 300 mg 1,1,2 for the time being. Exam also  showed signs of left ulnar neuropathy, he was advised to use an elbow brace. Patient was also referred patient to Pain Management for their assistance A/P: overall stable- he is not 100% sure he plans to see pain management   #Hyperglycemia history of mildly elevated A1c-  Prediabetes range. Lab Results  Component Value Date   HGBA1C 6.2 (H) 06/11/2020   HGBA1C 5.7 (H) 02/23/2020   HGBA1C 5.6 06/06/2019  -Patient is due for A1c today and this was ordered  # Left knee pain- last Thursday night sat in back of someones car- after 5 minutes with extreme pain - states he was even screaming. Friday night played poker- extended the leg and noted a clicking sensation- has an appointment next Monday with Dr. Latanya Maudlin. Pain has cooled back off. Hewill likely still keep appointment  Recommended follow up: Return in about 3 months (around 05/09/2021) for physical or sooner if needed. Future Appointments  Date Time Provider [place]  04/03/2021 10:00 AM Kirsteins, Luanna Salk, MD [facility] CPR  04/11/2021 10:30 AM Cameron Sprang, MD [facility] None  05/13/2021  9:40 AM [name], MD [facility] PEC  05/15/2021  9:30 AM [facility] HEALTH COACH [facility]  05/26/2021  8:15 AM Hilty, Nadean Corwin, MD [facility]    Lab/Order associations:   ICD-10-CM   1. Hyperglycemia  R73.9 Hemoglobin A1c    2. Essential hypertension  I10 CBC with Differential/Platelet    Comprehensive metabolic panel    3. Hereditary and idiopathic peripheral neuropathy  G60.9     4. Atherosclerosis of native coronary artery of native heart without angina pectoris  I25.10     5. Hyperlipidemia, unspecified hyperlipidemia type  E78.5      I,Harris Phan,acting as a scribe for [name], MD.,have documented all relevant documentation on the behalf of [name], MD,as directed by  [name], MD while in the presence of [name], MD.  I, [name], MD, have reviewed all documentation for this  visit. The documentation on 02/06/21 for the exam, diagnosis, procedures, and orders are all accurate and complete.  Return precautions advised.  [name], MD

## 2021-02-11 NOTE — Progress Notes (Signed)
Joshua [name] [place] [address] [place] Phone: [phone] Subjective:   Joshua [name], am serving as a scribe for Dr. Hulan [name]. This visit occurred during the SARS-CoV-2 public health emergency.  Safety protocols were in place, including screening questions prior to the visit, additional usage of staff PPE, and extensive cleaning of exam room while observing appropriate contact time as indicated for disinfecting solutions.   I'm seeing this patient by the request  of:  [name], MD  CC: Hip pain  [id number]  Joshua [name] is a 80 y.o. male coming in with complaint of hip pain. L TKR March 2022. Patient states now has pain in the right hip, sharp pain. Walked for about 10 mins yesterday and couldn't go further. Walking makes it worst. Left knee is in pain as well. Will not take pain killers, surgeon said there was fluid on knee last week.  Patient did undergo a CT scan of the abdomen and pelvis September.  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    Past Medical History:  Diagnosis Date   Acute medial meniscal tear    Anemia 2015   Arthritis    "middle finger right hand; right knee; neck" (02/06/2014)   Asthma    seasonal   Bladder cancer ([place]) 04/2010   "cauterized during prostate OR"   CAD (coronary artery disease)    CABG 2001   Diverticulosis    DJD (degenerative joint disease)    BACK   GERD (gastroesophageal reflux disease)    H/O inguinal hernia repair 12/2018   History of gout    Hyperlipidemia    Hypertension    IBS (irritable bowel syndrome)    Obesity    OSA (obstructive sleep apnea)    USES CPAP    Pancreatitis ~ 1980   Peripheral vascular disease ([place])    Pre-diabetes    Prostate cancer ([place]) 04/2010   Past Surgical History:  Procedure  Laterality Date   ANTERIOR CERVICAL DECOMP/DISCECTOMY FUSION  05/26/2006   PLATE PLACED   BACK SURGERY     CARDIAC CATHETERIZATION  11/20/1999   CORONARY ARTERY BYPASS GRAFT  11/20/1999   SVG-RI1-RI2, SVG-OM, SVG-dRCA   CORONARY STENT INTERVENTION N/A 04/13/2019   Procedure: CORONARY STENT INTERVENTION;  Surgeon: Burnell Blanks, MD;  Location: [facility];  Service: Cardiovascular;  Laterality: N/A;   HERNIA REPAIR     INGUINAL HERNIA REPAIR Left 01/02/2019   Procedure: OPEN LEFT INGUINAL HERNIA REPAIR WITH MESH;  Surgeon: Johnathan Hausen, MD;  Location: [facility];  Service: General;  Laterality: Left;   KNEE ARTHROSCOPY Right 1992; 04/2008   LAPAROSCOPIC CHOLECYSTECTOMY  2007   LEFT HEART CATH AND CORS/GRAFTS ANGIOGRAPHY N/A 04/13/2019   Procedure: LEFT HEART CATH AND CORS/GRAFTS ANGIOGRAPHY;  Surgeon: Burnell Blanks, MD;  Location: [facility];  Service: Cardiovascular;  Laterality: N/A;   NASAL SEPTUM SURGERY Right 2007   ROBOT ASSISTED LAPAROSCOPIC RADICAL PROSTATECTOMY  04/2010   THYROIDECTOMY, PARTIAL  1980   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   Procedure: LEFT TOTAL KNEE ARTHROPLASTY;  Surgeon: Melrose Nakayama, MD;  Location: [facility];  Service: Orthopedics;  Laterality: Left;   Social History   Socioeconomic History   Marital status: Married    Spouse name: Not on file   Number of  children: Not on file   Years of education: Not on file   Highest education level: Not on file  Occupational History   Occupation: retired    Fish farm manager: RETIRED  Tobacco Use   Smoking status: Former    Packs/day: 1.00    Years: 35.00    Pack years: 35.00    Types: Cigarettes    Quit date: 06/16/1992    Years since quitting: 28.6   Smokeless tobacco: Never  Vaping Use   Vaping Use: Never used  Substance and Sexual Activity   Alcohol use: Yes    Alcohol/week: 2.0 standard drinks    Types: 2 Shots of liquor per week   Drug use: No   Sexual activity: Not Currently     Partners: Female  Other Topics Concern   Not on file  Social History Narrative   Married 42 years in 2015. 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 [employer]: poker, movies and tv, staying active      Right handed   2 level home with steps he uses   Social Determinants of Radio broadcast assistant Strain: Low Risk    Difficulty of Paying Living Expenses: Not hard at all  Food Insecurity: No Food Insecurity   Worried About Charity fundraiser in the Last Year: Never true   Arboriculturist in the Last Year: Never true  Transportation Needs: No Transportation Needs   Lack of Transportation (Medical): No   Lack of Transportation (Non-Medical): No  Physical Activity: Inactive   Days of Exercise per Week: 0 days   Minutes of Exercise per Session: 0 min  Stress: No Stress Concern Present   Feeling of Stress : Only a little  Social Connections: Moderately Isolated   Frequency of Communication with Friends and Family: More than three times a week   Frequency of Social Gatherings with Friends and Family: Three times a week   Attends Religious Services: Never   Active Member of Clubs or Organizations: No   Attends Archivist Meetings: Never   Marital Status: Married   Allergies  Allergen Reactions   Benadryl [Diphenhydramine] Other (See Comments)    Pt told not to take because of bypass surgery   Bromfed Nausea And Vomiting   Clarithromycin Other (See Comments)     gastritis   Codeine Other (See Comments)    Trouble breathing   Doxycycline Hyclate Nausea And Vomiting   Lexapro [Escitalopram]     Dizziness   Modafinil Other (See Comments)     anxiety-nervousness   Oxycodone-Acetaminophen Itching   Promethazine Hcl Other (See Comments)     fainting   Quinolones Nausea Only    Cipro "felt real bad"   Telithromycin Nausea And Vomiting   Tramadol Nausea Only   Hydrocodone-Acetaminophen Rash   Statins Other  (See Comments)    Leg cramps and makes patient feel bad. Per pt tried multiple in years past   Sulfonamide Derivatives Rash   Family History  Problem Relation Age of Onset   Heart disease Mother    Hyperlipidemia Mother    Heart disease Father    Hyperlipidemia Father    Diabetes Brother    Colon cancer Neg Hx    Pancreatic cancer Neg Hx    Esophageal cancer Neg Hx    Stomach cancer Neg Hx    Liver cancer Neg Hx  Current Outpatient Medications (Cardiovascular):    Alirocumab (PRALUENT) 150 MG/ML SOAJ, INJECT 1 DOSE INTO THE SKIN EVERY 14 (FOURTEEN) DAYS.   amLODipine (NORVASC) 5 MG tablet, Take 1 tablet (5 mg total) by mouth daily.   EPINEPHrine 0.3 mg/0.3 mL IJ SOAJ injection, Inject 0.3 mg into the muscle as needed for anaphylaxis. for allergic reaction (Patient not taking: Reported on 01/29/2021)   hydrochlorothiazide (HYDRODIURIL) 25 MG tablet, TAKE 1 TABLET BY MOUTH EVERY DAY   nitroGLYCERIN (NITROSTAT) 0.4 MG SL tablet, PLACE 1 TABLET UNDER THE TONGUE EVERY 5 (FIVE) MINUTES AS NEEDED. (Patient not taking: Reported on 01/29/2021)   valsartan (DIOVAN) 320 MG tablet, Take 1 tablet (320 mg total) by mouth daily.  Current Outpatient Medications (Respiratory):    montelukast (SINGULAIR) 10 MG tablet, Take 1 tablet (10 mg total) by mouth at bedtime.   albuterol (PROAIR HFA) 108 (90 BASE) MCG/ACT inhaler, Inhale 1-2 puffs into the lungs every 6 (six) hours as needed for wheezing or shortness of breath.   FLOVENT HFA 110 MCG/ACT inhaler, Inhale 1 puff into the lungs 2 (two) times daily.    fluticasone (FLONASE) 50 MCG/ACT nasal spray, Place 1 spray into both nostrils 2 (two) times daily.    loratadine (CLARITIN) 10 MG tablet, Take 10 mg by mouth at bedtime.   Current Outpatient Medications (Analgesics):    acetaminophen (TYLENOL) 500 MG tablet, Take 1,000 mg by mouth every 6 (six) hours as needed for moderate pain or headache.   aspirin EC 81 MG tablet, Take 1 tablet (81 mg total)  by mouth 2 (two) times daily after a meal.  Current Outpatient Medications (Hematological):    Cyanocobalamin (B-12) 2500 MCG TABS, Take 1,250 mcg by mouth daily.   folic acid (FOLVITE) 481 MCG tablet, Take 800 mcg by mouth daily.   Current Outpatient Medications (Other):    busPIRone (BUSPAR) 5 MG tablet, Take 1 tablet (5 mg total) by mouth 2 (two) times daily as needed (anxiety).   cholecalciferol (VITAMIN D3) 25 MCG (1000 UNIT) tablet, Take 1,000 Units by mouth daily.   clotrimazole-betamethasone (LOTRISONE) cream, Apply 1 application topically See admin instructions. Twice daily every 4 days   diazepam (VALIUM) 5 MG tablet, Take 1 tablet (5 mg total) by mouth at bedtime as needed (sleep).   gabapentin (NEURONTIN) 300 MG capsule, Take 1 cap in AM, 1 cap at noon, 2 caps at bedtime (Patient taking differently: Take 300-600 mg by mouth See admin instructions. Take 300 mg in the morning, 300 mg at noon, and 600 mg at bedtime)   Glucos-Chond-Hyal Ac-Ca Fructo (MOVE FREE JOINT HEALTH ADVANCE PO), Take 1 tablet by mouth daily.   ketoconazole (NIZORAL) 2 % cream, Apply 1 application topically See admin instructions. Twice a day every other day   Miconazole Nitrate (LOTRIMIN AF DEODORANT POWDER) 2 % AERP, Apply 1 spray topically daily as needed (jock itch).   nystatin-triamcinolone ointment (MYCOLOG), Apply 1 application topically See admin instructions. Twice daily every 4 days   pantoprazole (PROTONIX) 40 MG tablet, TAKE 1 TABLET BY MOUTH EVERY DAY   polyethylene glycol powder (GLYCOLAX/MIRALAX) powder, Take 17 g by mouth daily.   tiZANidine (ZANAFLEX) 4 MG tablet, Take 1 tablet (4 mg total) by mouth every 6 (six) hours as needed for muscle spasms.   Wheat Dextrin (BENEFIBER) POWD, Take 1 Dose by mouth daily. 1 dose = 2 teaspoons   Reviewed prior external information including notes and imaging from  primary care provider As well as notes that  were available from care everywhere and other  healthcare systems.  Past medical history, social, surgical and family history all reviewed in electronic medical record.  No pertanent information unless stated regarding to the chief complaint.   Review of Systems:  No headache, visual changes, nausea, vomiting, diarrhea, constipation, dizziness, abdominal pain, skin rash, fevers, chills, night sweats, weight loss, swollen lymph nodes, body aches, chest pain, shortness of breath, mood changes. POSITIVE muscle aches, joint swelling  Objective  Blood pressure 120/76, pulse 81, height 5\' 5"  (1.651 m), weight 204 lb (92.5 kg), SpO2 95 %.   General: No apparent distress alert and oriented x3 mood and affect normal, dressed appropriately.  HEENT: Pupils equal, extraocular movements intact  Respiratory: Patient's speak in full sentences and does not appear short of breath  Cardiovascular: No lower extremity edema, non tender, no erythema  Antalgic gait  MSK: Left knee exam does show the patient does still have swelling noted of the joint replacement.  No erythema noted though.  Does have limited range of motion of 85 degrees of flexion. Right hip shows severe tenderness to palpation over the greater trochanteric area.  Patient does have negative straight leg test but significant tightness of the hamstrings bilaterally  After verbal consent patient was prepped with alcohol swab and with a 21-gauge 2 inch needle injected into the right greater trochanteric area with a total of 2 cc of 0.5% Marcaine and 1 cc of Kenalog 40 mg/mL no blood loss.  Band-Aid placed.  Postinjection instructions given    Impression and Recommendations:     The above documentation has been reviewed and is accurate and complete Lyndal Pulley, DO

## 2021-02-12 DIAGNOSIS — M25462 Effusion, left knee: Secondary | ICD-10-CM | POA: Diagnosis not present

## 2021-02-12 DIAGNOSIS — M25562 Pain in left knee: Secondary | ICD-10-CM | POA: Diagnosis not present

## 2021-02-17 ENCOUNTER — Other Ambulatory Visit: Payer: Self-pay

## 2021-02-17 ENCOUNTER — Ambulatory Visit: Payer: PPO | Admitting: Family Medicine

## 2021-02-17 ENCOUNTER — Ambulatory Visit (INDEPENDENT_AMBULATORY_CARE_PROVIDER_SITE_OTHER): Payer: PPO

## 2021-02-17 VITALS — BP 120/76 | HR 81 | Ht 65.0 in | Wt 204.0 lb

## 2021-02-17 DIAGNOSIS — Z96652 Presence of left artificial knee joint: Secondary | ICD-10-CM

## 2021-02-17 DIAGNOSIS — M7061 Trochanteric bursitis, right hip: Secondary | ICD-10-CM

## 2021-02-17 DIAGNOSIS — M25559 Pain in unspecified hip: Secondary | ICD-10-CM

## 2021-02-17 DIAGNOSIS — M1611 Unilateral primary osteoarthritis, right hip: Secondary | ICD-10-CM | POA: Diagnosis not present

## 2021-02-17 DIAGNOSIS — T8484XA Pain due to internal orthopedic prosthetic devices, implants and grafts, initial encounter: Secondary | ICD-10-CM

## 2021-02-17 DIAGNOSIS — M25551 Pain in right hip: Secondary | ICD-10-CM

## 2021-02-17 DIAGNOSIS — M1712 Unilateral primary osteoarthritis, left knee: Secondary | ICD-10-CM

## 2021-02-17 DIAGNOSIS — M25562 Pain in left knee: Secondary | ICD-10-CM | POA: Diagnosis not present

## 2021-02-17 MED ORDER — MONTELUKAST SODIUM 10 MG PO TABS: 10.0000 mg | ORAL_TABLET | Freq: Every day | ORAL | 3 refills | Status: DC

## 2021-02-17 NOTE — Patient Instructions (Addendum)
Good to see you  Try singulair to help maybe the swelling off the knee, watch for side effects Injected the side of the hip as well that I hope will help  If any side effects please seek medical attention  See me again in 6 weeks   Montelukast Tablets What is this medication? MONTELUKAST (mon te LOO kast) prevents and treats the symptoms of asthma and allergies. It works by decreasing inflammation in the airways, making it easier to breathe. Do not use this medication to treat a sudden asthma attack. This medicine may be used for other purposes; ask your health care provider or pharmacist if you have questions. COMMON BRAND NAME(S): Singulair What should I tell my care team before I take this medication? They need to know if you have any of these conditions: Liver disease An unusual or allergic reaction to montelukast, other medications, foods, dyes, or preservatives Pregnant or trying to get pregnant Breast-feeding How should I use this medication? Take this medication by mouth with water. Take it as directed on the prescription label at the same time every day. You can take this medication with or without food. If it upsets your stomach, take it with food. Keep taking it unless your care team tells you to stop. A special MedGuide will be given to you by the pharmacist with each prescription and refill. Be sure to read this information carefully each time. Talk to your care team about the use of this medication in children. While this medication may be prescribed for children as young as 15 years for selected conditions, precautions do apply. Overdosage: If you think you have taken too much of this medicine contact a poison control center or emergency room at once. NOTE: This medicine is only for you. Do not share this medicine with others. What if I miss a dose? If you miss a dose, skip it. Take your next dose at the normal time. Do not take extra or 2 doses at the same time to make up for  the missed dose. What may interact with this medication? Medications for seizures like phenytoin, phenobarbital, and carbamazepine Rifabutin Rifampin This list may not describe all possible interactions. Give your health care provider a list of all the medicines, herbs, non-prescription drugs, or dietary supplements you use. Also tell them if you smoke, drink alcohol, or use illegal drugs. Some items may interact with your medicine. What should I watch for while using this medication? Visit your health care provider for regular checks on your progress. Tell your health care provider if your allergy or asthma symptoms do not improve. Take your medication even when you do not have symptoms. If you have asthma, talk to your health care provider about what to do in an acute asthma attack. Always have your rescue medication for asthma attacks with you. Patients and their families should watch for new or worsening thoughts of suicide or depression. Also watch for sudden changes in feelings such as feeling anxious, agitated, panicky, irritable, hostile, aggressive, impulsive, severely restless, overly excited and hyperactive, or not being able to sleep. Any worsening of mood or thoughts of suicide or dying should be reported to your health care provider right away. What side effects may I notice from receiving this medication? Side effects that you should report to your care team as soon as possible: Allergic reactions--skin rash, itching, hives, swelling of the face, lips, tongue, or throat Flu-like symptoms--fever, chills, muscle pain, cough, headache, fatigue Mood and behavior changes such  as anxiety, nervousness, confusion, hallucinations, irritability, hostility, thoughts of suicide or self-harm, worsening mood, feelings of depression Pain, tingling, or numbness in the hands or feet Sinus pain or pressure around the face or forehead Trouble sleeping Vivid dreams or nightmares Side effects that  usually do not require medical attention (report to your care team if they continue or are bothersome): Cough Diarrhea Headache Runny or stuffy nose Sore throat Stomach pain This list may not describe all possible side effects. Call your doctor for medical advice about side effects. You may report side effects to FDA at 1-800-FDA-1088. Where should I keep my medication? Keep out of the reach of children and pets. Store at room temperature between 15 and 30 degrees C (59 and 86 degrees F). Protect from light and moisture. Protect from light and moisture. Keep the container tightly closed. Get rid of any unused medication after the expiration date. To get rid of medications that are no longer needed or expired: Take the medication to a medication take-back program. Check with your pharmacy or law enforcement to find a location. If you cannot return the medication, check the label or package insert to see if the medication should be thrown out in the garbage or flushed down the toilet. If you are not sure, ask your care team. If it is safe to put in the trash, empty the medication out of the container. Mix the medication with cat litter, dirt, coffee grounds, or other unwanted substance. Seal the mixture in a bag or container. Put it in the trash. NOTE: This sheet is a summary. It may not cover all possible information. If you have questions about this medicine, talk to your doctor, pharmacist, or health care provider.  2022 Elsevier/Gold Standard (2020-04-05 00:00:00)

## 2021-02-17 NOTE — Assessment & Plan Note (Signed)
New problem with patient likely compensating secondary to how he is ambulating secondary to the knee.  Differential includes lumbar radiculopathy but I think it is less likely.  Patient is already taking a decent amount of gabapentin at this time.  We will see how patient responds to the injection.  Home exercises given.  Follow-up again in 6 to 8 weeks

## 2021-02-17 NOTE — Assessment & Plan Note (Signed)
Patient continues to have pain and swelling of his left knee.  Patient states that it sometimes can be unbearable.  We discussed different treatment and discussed the possibility of using Singulair off label to help with potential inflammation.  Patient would like to try this.  Warned of black box warning.  Warned of other side effects.  Patient has had difficulty with multiple allergies of other medications over the course of time most of them being nausea.  We will see how patient responds.  Patient will follow-up with me again in 6 weeks to further evaluate.

## 2021-02-19 ENCOUNTER — Encounter: Payer: Self-pay | Admitting: Internal Medicine

## 2021-02-19 DIAGNOSIS — J3089 Other allergic rhinitis: Secondary | ICD-10-CM | POA: Diagnosis not present

## 2021-02-19 DIAGNOSIS — J3081 Allergic rhinitis due to animal (cat) (dog) hair and dander: Secondary | ICD-10-CM | POA: Diagnosis not present

## 2021-02-19 DIAGNOSIS — J301 Allergic rhinitis due to pollen: Secondary | ICD-10-CM | POA: Diagnosis not present

## 2021-02-21 ENCOUNTER — Other Ambulatory Visit: Payer: Self-pay | Admitting: Internal Medicine

## 2021-02-21 ENCOUNTER — Telehealth: Payer: Self-pay | Admitting: Family Medicine

## 2021-02-21 ENCOUNTER — Telehealth: Payer: Self-pay | Admitting: Gastroenterology

## 2021-02-21 DIAGNOSIS — R140 Abdominal distension (gaseous): Secondary | ICD-10-CM

## 2021-02-21 DIAGNOSIS — R1084 Generalized abdominal pain: Secondary | ICD-10-CM

## 2021-02-21 DIAGNOSIS — R1912 Hyperactive bowel sounds: Secondary | ICD-10-CM

## 2021-02-21 MED ORDER — NA SULFATE-K SULFATE-MG SULF 17.5-3.13-1.6 GM/177ML PO SOLN: 1.0000 | Freq: Once | ORAL | 0 refills | Status: DC

## 2021-02-21 NOTE — Telephone Encounter (Signed)
Per Janett Billow, this should be handled by you.

## 2021-02-21 NOTE — Telephone Encounter (Signed)
Per Janett Billow, this patient scheduled a colonoscopy with Dr. Hilarie Fredrickson.  The date is 04/29/21 at 8:00 a.m.  This patient will need instructions and a prescription for the prep.  Thank you.

## 2021-02-21 NOTE — Telephone Encounter (Signed)
Can take 2 weeks to work.  If worsening pain need to go to urgent care or emergency room

## 2021-02-21 NOTE — Telephone Encounter (Signed)
I have completed it I will mail to the pt and send a copy to my Chart.  I have also placed a call to the pt. The pt will pick up his prep on Monday.

## 2021-02-21 NOTE — Telephone Encounter (Signed)
Patient called to let Dr Tamala Julian know that he is having a lot of reoccurring hip pain. Please advise on what next steps might be.

## 2021-02-24 NOTE — Telephone Encounter (Signed)
Spoke with pt and he will pick up prescription if not too expensive. He will call back if he can not afford.

## 2021-02-24 NOTE — Telephone Encounter (Signed)
Sent patient MyChart message.

## 2021-02-26 DIAGNOSIS — J3089 Other allergic rhinitis: Secondary | ICD-10-CM | POA: Diagnosis not present

## 2021-02-26 DIAGNOSIS — J301 Allergic rhinitis due to pollen: Secondary | ICD-10-CM | POA: Diagnosis not present

## 2021-03-05 DIAGNOSIS — J3089 Other allergic rhinitis: Secondary | ICD-10-CM | POA: Diagnosis not present

## 2021-03-05 DIAGNOSIS — J3081 Allergic rhinitis due to animal (cat) (dog) hair and dander: Secondary | ICD-10-CM | POA: Diagnosis not present

## 2021-03-05 DIAGNOSIS — J301 Allergic rhinitis due to pollen: Secondary | ICD-10-CM | POA: Diagnosis not present

## 2021-03-06 DIAGNOSIS — G4733 Obstructive sleep apnea (adult) (pediatric): Secondary | ICD-10-CM | POA: Diagnosis not present

## 2021-03-12 ENCOUNTER — Telehealth: Payer: Self-pay | Admitting: Pulmonary Disease

## 2021-03-12 DIAGNOSIS — J301 Allergic rhinitis due to pollen: Secondary | ICD-10-CM | POA: Diagnosis not present

## 2021-03-12 DIAGNOSIS — J3081 Allergic rhinitis due to animal (cat) (dog) hair and dander: Secondary | ICD-10-CM | POA: Diagnosis not present

## 2021-03-12 DIAGNOSIS — J3089 Other allergic rhinitis: Secondary | ICD-10-CM | POA: Diagnosis not present

## 2021-03-12 DIAGNOSIS — M25562 Pain in left knee: Secondary | ICD-10-CM | POA: Diagnosis not present

## 2021-03-12 DIAGNOSIS — G4733 Obstructive sleep apnea (adult) (pediatric): Secondary | ICD-10-CM

## 2021-03-12 NOTE — Telephone Encounter (Signed)
Cpap supply renewal order has been placed for pt to have it sent to [facility]. Called and spoke with pt letting him know that this was done and he verbalized understanding. Nothing further needed.

## 2021-03-18 DIAGNOSIS — J3081 Allergic rhinitis due to animal (cat) (dog) hair and dander: Secondary | ICD-10-CM | POA: Diagnosis not present

## 2021-03-18 DIAGNOSIS — J301 Allergic rhinitis due to pollen: Secondary | ICD-10-CM | POA: Diagnosis not present

## 2021-03-18 DIAGNOSIS — J3089 Other allergic rhinitis: Secondary | ICD-10-CM | POA: Diagnosis not present

## 2021-03-25 ENCOUNTER — Encounter: Payer: Self-pay | Admitting: Family Medicine

## 2021-03-25 NOTE — Telephone Encounter (Signed)
See below

## 2021-03-27 DIAGNOSIS — F339 Major depressive disorder, recurrent, unspecified: Secondary | ICD-10-CM | POA: Diagnosis not present

## 2021-03-27 DIAGNOSIS — N183 Chronic kidney disease, stage 3 unspecified: Secondary | ICD-10-CM | POA: Diagnosis not present

## 2021-03-27 DIAGNOSIS — D692 Other nonthrombocytopenic purpura: Secondary | ICD-10-CM | POA: Diagnosis not present

## 2021-03-27 NOTE — Progress Notes (Signed)
[place] Phone: [phone] Subjective:   [name], am serving as a scribe for Dr. Hulan Saas. This visit occurred during the SARS-CoV-2 public health emergency.  Safety protocols were in place, including screening questions prior to the visit, additional usage of staff PPE, and extensive cleaning of exam room while observing appropriate contact time as indicated for disinfecting solutions.   I'm seeing this patient by the request  of:  [name], MD  CC: Low back and thoracic pain  NAT:[id number]  02/17/2021 New problem with patient likely compensating secondary to how he is ambulating secondary to the knee.  Differential includes lumbar radiculopathy but I think it is less likely.  Patient is already taking a decent amount of gabapentin at this time.  We will see how patient responds to the injection.  Home exercises given.  Follow-up again in 6 to 8 weeks  Patient continues to have pain and swelling of his left knee.  Patient states that it sometimes can be unbearable.  We discussed different treatment and discussed the possibility of using Singulair off label to help with potential inflammation.  Patient would like to try this.  Warned of black box warning.  Warned of other side effects.  Patient has had difficulty with multiple allergies of other medications over the course of time most of them being nausea.  We will see how patient responds.  Patient will follow-up with me again in 6 weeks to further evaluate.  Update 03/28/2021 SAHARSH STERLING is a 81 y.o. male coming in with complaint of thoracic and lumbar spine and L knee pain. Patient states that his L knee continues to bother him. Montelukast has helped him but states that he stopped taking it due to feeling more emotional.   States that his thoracic and lumbar spine on left side are bothering him more than usual. Denies any radiating symptoms.         Past Medical History:  Diagnosis Date   Acute medial meniscal tear    Anemia 2015   Arthritis    "middle finger right hand; right knee; neck" (02/06/2014)   Asthma    seasonal   Bladder cancer ([place]) 04/2010   "cauterized during prostate OR"   CAD (coronary artery disease)    CABG 2001   Diverticulosis    DJD (degenerative joint disease)    BACK   GERD (gastroesophageal reflux disease)    H/O inguinal hernia repair 12/2018   History of gout    Hyperlipidemia    Hypertension    IBS (irritable bowel syndrome)    Obesity    OSA (obstructive sleep apnea)    USES CPAP    Pancreatitis ~ 1980   Peripheral vascular disease ([place])    Pre-diabetes    Prostate cancer ([place]) 04/2010   Past Surgical History:  Procedure Laterality Date   ANTERIOR CERVICAL DECOMP/DISCECTOMY FUSION  05/26/2006   PLATE PLACED   BACK SURGERY     CARDIAC CATHETERIZATION  11/20/1999   CORONARY ARTERY BYPASS GRAFT  11/20/1999   SVG-RI1-RI2, SVG-OM, SVG-dRCA   CORONARY STENT INTERVENTION N/A 04/13/2019   Procedure: CORONARY STENT INTERVENTION;  Surgeon: Burnell Blanks, MD;  Location: [facility];  Service: Cardiovascular;  Laterality: N/A;   HERNIA REPAIR     INGUINAL HERNIA REPAIR Left 01/02/2019   Procedure: OPEN LEFT INGUINAL HERNIA REPAIR WITH MESH;  Surgeon: Johnathan Hausen, MD;  Location: [facility];  Service: General;  Laterality: Left;   KNEE ARTHROSCOPY Right 1992; 04/2008   LAPAROSCOPIC CHOLECYSTECTOMY  2007   LEFT HEART CATH AND CORS/GRAFTS ANGIOGRAPHY N/A 04/13/2019   Procedure: LEFT HEART CATH AND CORS/GRAFTS ANGIOGRAPHY;  Surgeon: Burnell Blanks, MD;  Location: [facility];  Service: Cardiovascular;  Laterality: N/A;   NASAL SEPTUM SURGERY Right 2007   ROBOT ASSISTED LAPAROSCOPIC RADICAL PROSTATECTOMY  04/2010   THYROIDECTOMY, PARTIAL  1980   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   Procedure: LEFT TOTAL KNEE ARTHROPLASTY;  Surgeon: Melrose Nakayama, MD;   Location: [facility];  Service: Orthopedics;  Laterality: Left;   Social History   Socioeconomic History   Marital status: Married    Spouse name: Not on file   Number of children: Not on file   Years of education: Not on file   Highest education level: Not on file  Occupational History   Occupation: retired    Fish farm manager: RETIRED  Tobacco Use   Smoking status: Former    Packs/day: 1.00    Years: 35.00    Pack years: 35.00    Types: Cigarettes    Quit date: 06/16/1992    Years since quitting: 28.8   Smokeless tobacco: Never  Vaping Use   Vaping Use: Never used  Substance and Sexual Activity   Alcohol use: Yes    Alcohol/week: 2.0 standard drinks    Types: 2 Shots of liquor per week   Drug use: No   Sexual activity: Not Currently    Partners: Female  Other Topics Concern   Not on file  Social History Narrative   Married 42 years in 2015. 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 [employer]: poker, movies and tv, staying active      Right handed   2 level home with steps he uses   Social Determinants of Radio broadcast assistant Strain: Low Risk    Difficulty of Paying Living Expenses: Not hard at all  Food Insecurity: No Food Insecurity   Worried About Charity fundraiser in the Last Year: Never true   Arboriculturist in the Last Year: Never true  Transportation Needs: No Transportation Needs   Lack of Transportation (Medical): No   Lack of Transportation (Non-Medical): No  Physical Activity: Inactive   Days of Exercise per Week: 0 days   Minutes of Exercise per Session: 0 min  Stress: No Stress Concern Present   Feeling of Stress : Only a little  Social Connections: Moderately Isolated   Frequency of Communication with Friends and Family: More than three times a week   Frequency of Social Gatherings with Friends and Family: Three times a week   Attends Religious Services: Never   Active Member of Clubs or  Organizations: No   Attends Archivist Meetings: Never   Marital Status: Married   Allergies  Allergen Reactions   Benadryl [Diphenhydramine] Other (See Comments)    Pt told not to take because of bypass surgery   Bromfed Nausea And Vomiting   Clarithromycin Other (See Comments)     gastritis   Codeine Other (See Comments)    Trouble breathing   Doxycycline Hyclate Nausea And Vomiting   Lexapro [Escitalopram]     Dizziness   Modafinil Other (See Comments)     anxiety-nervousness   Oxycodone-Acetaminophen Itching   Promethazine Hcl Other (See Comments)  fainting   Quinolones Nausea Only    Cipro "felt real bad"   Telithromycin Nausea And Vomiting   Tramadol Nausea Only   Hydrocodone-Acetaminophen Rash   Statins Other (See Comments)    Leg cramps and makes patient feel bad. Per pt tried multiple in years past   Sulfonamide Derivatives Rash   Family History  Problem Relation Age of Onset   Heart disease Mother    Hyperlipidemia Mother    Heart disease Father    Hyperlipidemia Father    Diabetes Brother    Colon cancer Neg Hx    Pancreatic cancer Neg Hx    Esophageal cancer Neg Hx    Stomach cancer Neg Hx    Liver cancer Neg Hx      Current Outpatient Medications (Cardiovascular):    Alirocumab (PRALUENT) 150 MG/ML SOAJ, INJECT 1 DOSE INTO THE SKIN EVERY 14 (FOURTEEN) DAYS.   amLODipine (NORVASC) 5 MG tablet, Take 1 tablet (5 mg total) by mouth daily.   EPINEPHrine 0.3 mg/0.3 mL IJ SOAJ injection, Inject 0.3 mg into the muscle as needed for anaphylaxis. for allergic reaction   hydrochlorothiazide (HYDRODIURIL) 25 MG tablet, TAKE 1 TABLET BY MOUTH EVERY DAY   nitroGLYCERIN (NITROSTAT) 0.4 MG SL tablet, PLACE 1 TABLET UNDER THE TONGUE EVERY 5 (FIVE) MINUTES AS NEEDED.   valsartan (DIOVAN) 320 MG tablet, Take 1 tablet (320 mg total) by mouth daily.  Current Outpatient Medications (Respiratory):    albuterol (PROAIR HFA) 108 (90 BASE) MCG/ACT inhaler,  Inhale 1-2 puffs into the lungs every 6 (six) hours as needed for wheezing or shortness of breath.   FLOVENT HFA 110 MCG/ACT inhaler, Inhale 1 puff into the lungs 2 (two) times daily.    fluticasone (FLONASE) 50 MCG/ACT nasal spray, Place 1 spray into both nostrils 2 (two) times daily.    loratadine (CLARITIN) 10 MG tablet, Take 10 mg by mouth at bedtime.    montelukast (SINGULAIR) 10 MG tablet, Take 1 tablet (10 mg total) by mouth at bedtime.  Current Outpatient Medications (Analgesics):    acetaminophen (TYLENOL) 500 MG tablet, Take 1,000 mg by mouth every 6 (six) hours as needed for moderate pain or headache.   aspirin EC 81 MG tablet, Take 1 tablet (81 mg total) by mouth 2 (two) times daily after a meal.  Current Outpatient Medications (Hematological):    Cyanocobalamin (B-12) 2500 MCG TABS, Take 1,250 mcg by mouth daily.   folic acid (FOLVITE) 151 MCG tablet, Take 800 mcg by mouth daily.   Current Outpatient Medications (Other):    busPIRone (BUSPAR) 5 MG tablet, Take 1 tablet (5 mg total) by mouth 2 (two) times daily as needed (anxiety).   cholecalciferol (VITAMIN D3) 25 MCG (1000 UNIT) tablet, Take 1,000 Units by mouth daily.   clotrimazole-betamethasone (LOTRISONE) cream, Apply 1 application topically See admin instructions. Twice daily every 4 days   diazepam (VALIUM) 5 MG tablet, Take 1 tablet (5 mg total) by mouth at bedtime as needed (sleep).   gabapentin (NEURONTIN) 300 MG capsule, Take 1 cap in AM, 1 cap at noon, 2 caps at bedtime (Patient taking differently: Take 300-600 mg by mouth See admin instructions. Take 300 mg in the morning, 300 mg at noon, and 600 mg at bedtime)   Glucos-Chond-Hyal Ac-Ca Fructo (MOVE FREE JOINT HEALTH ADVANCE PO), Take 1 tablet by mouth daily.   ketoconazole (NIZORAL) 2 % cream, Apply 1 application topically See admin instructions. Twice a day every other day   Miconazole Nitrate (LOTRIMIN  AF DEODORANT POWDER) 2 % AERP, Apply 1 spray topically daily as  needed (jock itch).   Na Sulfate-K Sulfate-Mg Sulf 17.5-3.13-1.6 GM/177ML SOLN, USE AS DIRECTED   nystatin-triamcinolone ointment (MYCOLOG), Apply 1 application topically See admin instructions. Twice daily every 4 days   pantoprazole (PROTONIX) 40 MG tablet, TAKE 1 TABLET BY MOUTH EVERY DAY   polyethylene glycol powder (GLYCOLAX/MIRALAX) powder, Take 17 g by mouth daily.   tiZANidine (ZANAFLEX) 4 MG tablet, Take 1 tablet (4 mg total) by mouth every 6 (six) hours as needed for muscle spasms.   Wheat Dextrin (BENEFIBER) POWD, Take 1 Dose by mouth daily. 1 dose = 2 teaspoons   Reviewed prior external information including notes and imaging from  primary care provider As well as notes that were available from care everywhere and other healthcare systems.  Past medical history, social, surgical and family history all reviewed in electronic medical record.  No pertanent information unless stated regarding to the chief complaint.   Review of Systems:  No headache, visual changes, nausea, vomiting, diarrhea, constipation, dizziness, abdominal pain, skin rash, fevers, chills, night sweats, weight loss, swollen lymph nodes, joint swelling, chest pain, shortness of breath, mood changes. POSITIVE muscle aches, body ache  Objective  Blood pressure 124/66, pulse 80, height 5\' 5"  (1.651 m), weight 215 lb (97.5 kg), SpO2 98 %.   General: No apparent distress alert and oriented x3 mood and affect normal, dressed appropriately.  HEENT: Pupils equal, extraocular movements intact  Respiratory: Patient's speak in full sentences and does not appear short of breath  Cardiovascular: No lower extremity edema, non tender, no erythema  Gait severely antalgic Patient's left knee does not have as much significant inflammation and what was noted previously.  He still only has 90 degrees of flexion.  Patient back exam significant loss of lordosis.  Poor core strength noted.  Significant stiffness noted of the right hip  compared to the left.  Patient does have significant tightness of the hamstring bilaterally.  Neurovascularly intact distally.   Impression and Recommendations:     The above documentation has been reviewed and is accurate and complete Lyndal Pulley, DO

## 2021-03-28 ENCOUNTER — Encounter: Payer: Self-pay | Admitting: Family Medicine

## 2021-03-28 ENCOUNTER — Ambulatory Visit (INDEPENDENT_AMBULATORY_CARE_PROVIDER_SITE_OTHER): Payer: PPO

## 2021-03-28 ENCOUNTER — Ambulatory Visit: Payer: PPO | Admitting: Family Medicine

## 2021-03-28 ENCOUNTER — Other Ambulatory Visit: Payer: Self-pay

## 2021-03-28 VITALS — BP 124/66 | HR 80 | Ht 65.0 in | Wt 215.0 lb

## 2021-03-28 DIAGNOSIS — M545 Low back pain, unspecified: Secondary | ICD-10-CM

## 2021-03-28 DIAGNOSIS — M1712 Unilateral primary osteoarthritis, left knee: Secondary | ICD-10-CM | POA: Diagnosis not present

## 2021-03-28 DIAGNOSIS — M47816 Spondylosis without myelopathy or radiculopathy, lumbar region: Secondary | ICD-10-CM | POA: Diagnosis not present

## 2021-03-28 DIAGNOSIS — M5136 Other intervertebral disc degeneration, lumbar region: Secondary | ICD-10-CM

## 2021-03-28 DIAGNOSIS — M4186 Other forms of scoliosis, lumbar region: Secondary | ICD-10-CM | POA: Diagnosis not present

## 2021-03-28 NOTE — Patient Instructions (Addendum)
Xray today Aquatic therapy Stop antihistamine try Zyrtec at night See me 6 weeks

## 2021-03-28 NOTE — Assessment & Plan Note (Signed)
Known degenerative disc disease.  Patient pain weakness and is having more unfortunately exacerbation at the moment.  Do feel it is from him compensating for patient's knee replacement.  Patient is also been gaining weight that likely is not helping.  We discussed with patient about icing regimen and home exercises, discussed which activities to do which wants to avoid.  Would like patient to get started and be more physically active so I do think aquatic therapy would be more beneficial.  Follow-up again in 6 weeks

## 2021-03-29 DIAGNOSIS — K1379 Other lesions of oral mucosa: Secondary | ICD-10-CM | POA: Diagnosis not present

## 2021-03-29 DIAGNOSIS — N390 Urinary tract infection, site not specified: Secondary | ICD-10-CM | POA: Diagnosis not present

## 2021-03-29 DIAGNOSIS — R350 Frequency of micturition: Secondary | ICD-10-CM | POA: Diagnosis not present

## 2021-03-29 DIAGNOSIS — N23 Unspecified renal colic: Secondary | ICD-10-CM | POA: Diagnosis not present

## 2021-03-29 DIAGNOSIS — R300 Dysuria: Secondary | ICD-10-CM | POA: Diagnosis not present

## 2021-03-31 ENCOUNTER — Ambulatory Visit (INDEPENDENT_AMBULATORY_CARE_PROVIDER_SITE_OTHER): Payer: PPO | Admitting: Family Medicine

## 2021-03-31 ENCOUNTER — Other Ambulatory Visit: Payer: Self-pay

## 2021-03-31 ENCOUNTER — Encounter: Payer: Self-pay | Admitting: Family Medicine

## 2021-03-31 VITALS — BP 130/68 | HR 86 | Temp 97.6°F | Ht 65.25 in | Wt 204.1 lb

## 2021-03-31 DIAGNOSIS — R350 Frequency of micturition: Secondary | ICD-10-CM | POA: Diagnosis not present

## 2021-03-31 LAB — POCT URINALYSIS DIPSTICK
Bilirubin, UA: NEGATIVE
Blood, UA: NEGATIVE
Glucose, UA: NEGATIVE
Ketones, UA: NEGATIVE
Leukocytes, UA: NEGATIVE
Nitrite, UA: POSITIVE
Protein, UA: NEGATIVE
Spec Grav, UA: 1.02 (ref 1.010–1.025)
Urobilinogen, UA: 1 E.U./dL
pH, UA: 6 (ref 5.0–8.0)

## 2021-03-31 NOTE — Patient Instructions (Signed)
Continue amoxicillin.  If worse, new symptoms, call.  Otherwise await culture.  Call on Wednesday for results.  You can take tylenol for pain/fevers If worsening symptoms, let us know or go to the Emergency room

## 2021-03-31 NOTE — Progress Notes (Signed)
Subjective:     Patient ID: Joshua [name], male    DOB: Oct 12, 1940, 81 y.o.   MRN: [id number]  Chief Complaint  Patient presents with   Follow-up    Follow-up on UTI, has taking amoxicillin and took AZO Friday afternoon Still having  a lot of burning, but better than it was   Urinary Frequency   Fatigue    HPI H/o bladder and prostate ca-saw urol around Nov.  Started w/urine odor 1 wk ago.  Then on Friday, burning so took azo.  Then next day on 03/29/21 called [facility] and nurse came out and saw MD on video. Ordered amox 500mg  bid.  Felling better today than yesterday(felt poorly yestday).  Slep 16 hrs on 1/7 Still not feeling "up to par".  And pain penis but not testicles, etc. No edema. No fever, but had chills 2 days ago  Dysuria, frequency,gotta go.  No v/d. No new back pain.  Cscope in Feb  There are no preventive care reminders to display for this patient.  Past Medical History:  Diagnosis Date   Acute medial meniscal tear    Anemia 2015   Arthritis    "middle finger right hand; right knee; neck" (02/06/2014)   Asthma    seasonal   Bladder cancer ([place]) 04/2010   "cauterized during prostate OR"   CAD (coronary artery disease)    CABG 2001   Diverticulosis    DJD (degenerative joint disease)    BACK   GERD (gastroesophageal reflux disease)    H/O inguinal hernia repair 12/2018   History of gout    Hyperlipidemia    Hypertension    IBS (irritable bowel syndrome)    Obesity    OSA (obstructive sleep apnea)    USES CPAP    Pancreatitis ~ 1980   Peripheral vascular disease ([place])    Pre-diabetes    Prostate cancer ([place]) 04/2010    Past Surgical History:  Procedure Laterality Date   ANTERIOR CERVICAL DECOMP/DISCECTOMY FUSION  05/26/2006   PLATE PLACED   BACK SURGERY     CARDIAC CATHETERIZATION  11/20/1999   CORONARY ARTERY BYPASS GRAFT  11/20/1999   SVG-RI1-RI2, SVG-OM, SVG-dRCA   CORONARY STENT INTERVENTION N/A 04/13/2019   Procedure:  CORONARY STENT INTERVENTION;  Surgeon: Burnell Blanks, MD;  Location: [facility];  Service: Cardiovascular;  Laterality: N/A;   HERNIA REPAIR     INGUINAL HERNIA REPAIR Left 01/02/2019   Procedure: OPEN LEFT INGUINAL HERNIA REPAIR WITH MESH;  Surgeon: Johnathan Hausen, MD;  Location: [facility];  Service: General;  Laterality: Left;   KNEE ARTHROSCOPY Right 1992; 04/2008   LAPAROSCOPIC CHOLECYSTECTOMY  2007   LEFT HEART CATH AND CORS/GRAFTS ANGIOGRAPHY N/A 04/13/2019   Procedure: LEFT HEART CATH AND CORS/GRAFTS ANGIOGRAPHY;  Surgeon: Burnell Blanks, MD;  Location: [facility];  Service: Cardiovascular;  Laterality: N/A;   NASAL SEPTUM SURGERY Right 2007   ROBOT ASSISTED LAPAROSCOPIC RADICAL PROSTATECTOMY  04/2010   THYROIDECTOMY, PARTIAL  1980   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   Procedure: LEFT TOTAL KNEE ARTHROPLASTY;  Surgeon: Melrose Nakayama, MD;  Location: [facility];  Service: Orthopedics;  Laterality: Left;    Outpatient Medications Prior to Visit  Medication Sig Dispense Refill   acetaminophen (TYLENOL) 500 MG tablet Take 1,000 mg by mouth every 6 (six) hours as needed for moderate pain or headache.     albuterol (PROAIR HFA) 108 (90 BASE) MCG/ACT inhaler Inhale 1-2 puffs into  the lungs every 6 (six) hours as needed for wheezing or shortness of breath. 18 g 3   Alirocumab (PRALUENT) 150 MG/ML SOAJ INJECT 1 DOSE INTO THE SKIN EVERY 14 (FOURTEEN) DAYS. 2 mL 11   amLODipine (NORVASC) 5 MG tablet Take 1 tablet (5 mg total) by mouth daily. 90 tablet 3   aspirin EC 81 MG tablet Take 1 tablet (81 mg total) by mouth 2 (two) times daily after a meal. 30 tablet 11   busPIRone (BUSPAR) 5 MG tablet Take 1 tablet (5 mg total) by mouth 2 (two) times daily as needed (anxiety). 40 tablet 2   cholecalciferol (VITAMIN D3) 25 MCG (1000 UNIT) tablet Take 1,000 Units by mouth daily.     clotrimazole-betamethasone (LOTRISONE) cream Apply 1 application topically See admin  instructions. Twice daily every 4 days     Cyanocobalamin (B-12) 2500 MCG TABS Take 1,250 mcg by mouth daily.     diazepam (VALIUM) 5 MG tablet Take 1 tablet (5 mg total) by mouth at bedtime as needed (sleep). 30 tablet 1   EPINEPHrine 0.3 mg/0.3 mL IJ SOAJ injection Inject 0.3 mg into the muscle as needed for anaphylaxis. for allergic reaction     FLOVENT HFA 110 MCG/ACT inhaler Inhale 1 puff into the lungs 2 (two) times daily.   5   fluticasone (FLONASE) 50 MCG/ACT nasal spray Place 1 spray into both nostrils 2 (two) times daily.      folic acid (FOLVITE) 824 MCG tablet Take 800 mcg by mouth daily.      gabapentin (NEURONTIN) 300 MG capsule Take 1 cap in AM, 1 cap at noon, 2 caps at bedtime (Patient taking differently: Take 300-600 mg by mouth See admin instructions. Take 300 mg in the morning, 300 mg at noon, and 600 mg at bedtime) 360 capsule 3   Glucos-Chond-Hyal Ac-Ca Fructo (MOVE FREE JOINT HEALTH ADVANCE PO) Take 1 tablet by mouth daily.     hydrochlorothiazide (HYDRODIURIL) 25 MG tablet TAKE 1 TABLET BY MOUTH EVERY DAY 90 tablet 2   ketoconazole (NIZORAL) 2 % cream Apply 1 application topically See admin instructions. Twice a day every other day     loratadine (CLARITIN) 10 MG tablet Take 10 mg by mouth at bedtime.      Miconazole Nitrate (LOTRIMIN AF DEODORANT POWDER) 2 % AERP Apply 1 spray topically daily as needed (jock itch).     Na Sulfate-K Sulfate-Mg Sulf 17.5-3.13-1.6 GM/177ML SOLN USE AS DIRECTED 354 mL 0   nitroGLYCERIN (NITROSTAT) 0.4 MG SL tablet PLACE 1 TABLET UNDER THE TONGUE EVERY 5 (FIVE) MINUTES AS NEEDED. 75 tablet 1   nystatin-triamcinolone ointment (MYCOLOG) Apply 1 application topically See admin instructions. Twice daily every 4 days     pantoprazole (PROTONIX) 40 MG tablet TAKE 1 TABLET BY MOUTH EVERY DAY 90 tablet 3   polyethylene glycol powder (GLYCOLAX/MIRALAX) powder Take 17 g by mouth daily. 255 g 0   tiZANidine (ZANAFLEX) 4 MG tablet Take 1 tablet (4 mg total)  by mouth every 6 (six) hours as needed for muscle spasms. 40 tablet 1   valsartan (DIOVAN) 320 MG tablet Take 1 tablet (320 mg total) by mouth daily. 90 tablet 3   Wheat Dextrin (BENEFIBER) POWD Take 1 Dose by mouth daily. 1 dose = 2 teaspoons     amoxicillin (AMOXIL) 500 MG tablet Take 1 tablet by mouth 2 (two) times daily.     ezetimibe (ZETIA) 10 MG tablet Take 10 mg by mouth daily.  montelukast (SINGULAIR) 10 MG tablet Take 1 tablet (10 mg total) by mouth at bedtime. 30 tablet 3   No facility-administered medications prior to visit.    Allergies  Allergen Reactions   Benadryl [Diphenhydramine] Other (See Comments)    Pt told not to take because of bypass surgery   Bromfed Nausea And Vomiting   Clarithromycin Other (See Comments)     gastritis   Codeine Other (See Comments)    Trouble breathing   Doxycycline Hyclate Nausea And Vomiting   Lexapro [Escitalopram]     Dizziness   Modafinil Other (See Comments)     anxiety-nervousness   Oxycodone-Acetaminophen Itching   Promethazine Hcl Other (See Comments)     fainting   Quinolones Nausea Only    Cipro "felt real bad"   Telithromycin Nausea And Vomiting   Tramadol Nausea Only   Hydrocodone-Acetaminophen Rash   Statins Other (See Comments)    Leg cramps and makes patient feel bad. Per pt tried multiple in years past   Sulfonamide Derivatives Rash   ZOX:[id number] except as noted in HPI      Objective:     BP 130/68    Pulse 86    Temp 97.6 F (36.4 C) (Temporal)    Ht 5' 5.25" (1.657 m)    Wt 204 lb 2 oz (92.6 kg)    SpO2 95%    BMI 33.71 kg/m  Wt Readings from Last 3 Encounters:  03/31/21 204 lb 2 oz (92.6 kg)  03/28/21 215 lb (97.5 kg)  02/17/21 204 lb (92.5 kg)        Gen: WDWN NAD OWM HEENT: NCAT, conjunctiva not injected, sclera nonicteric NECK:  supple, no thyromegaly, no nodes, no carotid bruits CARDIAC: RRR, S1S2+, no murmur. DP 2+B LUNGS: CTAB. No wheezes ABDOMEN:  BS+, soft, NTND,  No HSM, no masses GU. Chaperone QJ present.  No penile d/c/nor red.  No scrotal tenderness but pink. EXT:  tr L(chronic) MSK: no gross abnormalities.  NEURO: A&O x3.  CN II-XII intact.  PSYCH: normal mood. Good eye contact  Results for orders placed or performed in visit on 03/31/21  POCT urinalysis dipstick  Result Value Ref Range   Color, UA ORANGE    Clarity, UA CLEAR    Glucose, UA Negative Negative   Bilirubin, UA NEGATIVE    Ketones, UA NEGATIVE    Spec Grav, UA 1.020 1.010 - 1.025   Blood, UA NEGATIVE    pH, UA 6.0 5.0 - 8.0   Protein, UA Negative Negative   Urobilinogen, UA 1.0 0.2 or 1.0 E.U./dL   Nitrite, UA POSITIVE    Leukocytes, UA Negative Negative   Appearance     Odor     *Note: Due to a large number of results and/or encounters for the requested time period, some results have not been displayed. A complete set of results can be found in Results Review.     Assessment & Plan:   Problem List Items Addressed This Visit   None Visit Diagnoses     Frequent urination    -  Primary   Relevant Orders   POCT urinalysis dipstick (Completed)   Urine Culture      UTI-some improvement on 5th dose of amoxicillin.  A lot of drug allergies/intolerances.  Since getting better, will cont amox and await culture.  If worse, new symptoms, etc, then call, o/w call Wednesday for cx results.  May have results from [facility] back today(advised to check) UA-color interference from  azo so not sure if nitrites + or not  No orders of the defined types were placed in this encounter.   Wellington Hampshire, MD

## 2021-04-01 LAB — URINE CULTURE
MICRO NUMBER:: 12845062
Result:: NO GROWTH
SPECIMEN QUALITY:: ADEQUATE

## 2021-04-02 ENCOUNTER — Encounter: Payer: Self-pay | Admitting: Family Medicine

## 2021-04-02 DIAGNOSIS — J3081 Allergic rhinitis due to animal (cat) (dog) hair and dander: Secondary | ICD-10-CM | POA: Diagnosis not present

## 2021-04-02 DIAGNOSIS — J3089 Other allergic rhinitis: Secondary | ICD-10-CM | POA: Diagnosis not present

## 2021-04-02 DIAGNOSIS — J301 Allergic rhinitis due to pollen: Secondary | ICD-10-CM | POA: Diagnosis not present

## 2021-04-03 ENCOUNTER — Other Ambulatory Visit: Payer: Self-pay

## 2021-04-03 ENCOUNTER — Encounter: Payer: Self-pay | Admitting: Family Medicine

## 2021-04-03 ENCOUNTER — Ambulatory Visit: Payer: PPO | Admitting: Physical Medicine & Rehabilitation

## 2021-04-03 DIAGNOSIS — I714 Abdominal aortic aneurysm, without rupture, unspecified: Secondary | ICD-10-CM

## 2021-04-04 ENCOUNTER — Ambulatory Visit (INDEPENDENT_AMBULATORY_CARE_PROVIDER_SITE_OTHER): Payer: PPO | Admitting: Family Medicine

## 2021-04-04 ENCOUNTER — Encounter: Payer: Self-pay | Admitting: Family Medicine

## 2021-04-04 ENCOUNTER — Other Ambulatory Visit: Payer: Self-pay

## 2021-04-04 ENCOUNTER — Ambulatory Visit (INDEPENDENT_AMBULATORY_CARE_PROVIDER_SITE_OTHER)
Admission: RE | Admit: 2021-04-04 | Discharge: 2021-04-04 | Disposition: A | Discharge status: Home / Self Care | Payer: PPO | Source: Ambulatory Visit | Attending: Family Medicine | Admitting: Family Medicine

## 2021-04-04 VITALS — BP 130/70 | HR 84 | Temp 97.5°F | Ht 65.25 in | Wt 204.0 lb

## 2021-04-04 DIAGNOSIS — R059 Cough, unspecified: Secondary | ICD-10-CM | POA: Diagnosis not present

## 2021-04-04 DIAGNOSIS — R051 Acute cough: Secondary | ICD-10-CM

## 2021-04-04 LAB — POCT INFLUENZA A/B
Influenza A, POC: NEGATIVE
Influenza B, POC: NEGATIVE

## 2021-04-04 MED ORDER — MOLNUPIRAVIR EUA 200MG CAPSULE: 4.0000 | ORAL_CAPSULE | Freq: Two times a day (BID) | ORAL | 0 refills | Status: DC

## 2021-04-04 NOTE — Progress Notes (Signed)
Phone [phone] Virtual visit via Video note   Subjective:  Chief complaint: Chief Complaint  Patient presents with   Discuss Gemteza   COVID positive    Pt has finished pills and his wife has it now also.     This visit type was conducted due to national recommendations for restrictions regarding the COVID-19 Pandemic (e.g. social distancing).  This format is felt to be most appropriate for this patient at this time balancing risks to patient and risks to population by having him in for in person visit.  No physical exam was performed (except for noted visual exam or audio findings with Telehealth visits).    Our team/I connected with Joshua [name] at  9:20 AM EST by a video enabled telemedicine application (doxy.me or caregility through epic) and verified that I am speaking with the correct person using two identifiers.  Location patient: [place] Location provider: [facility], office Persons participating in the virtual visit:  patient  Our team/I discussed the limitations of evaluation and management by telemedicine and the availability of in person appointments. In light of current covid-19 pandemic, patient also understands that we are trying to protect them by minimizing in office contact if at all possible.  The patient expressed consent for telemedicine visit and agreed to proceed. Patient understands insurance will be billed.   Past Medical History-  Patient Active Problem List   Diagnosis Date Noted   Coronary atherosclerosis 10/12/2006    Priority: High   Irritable bowel syndrome 10/12/2006    Priority: High   Overactive bladder 11/24/2019    Priority: Medium    Hyperglycemia 10/07/2017    Priority: Medium    Gout 11/20/2016    Priority: Medium    CKD (chronic kidney disease), stage III ([place]) 10/23/2013    Priority: Medium    Neuropathy ([place]) 08/21/2013    Priority: Medium    History of bladder cancer 04/01/2010    Priority: Medium    Essential  hypertension 02/21/2007    Priority: Medium    Hyperlipidemia 10/12/2006    Priority: Medium    Depression, major, single episode, in partial remission ([place]) 10/12/2006    Priority: Medium    Asthma 10/12/2006    Priority: Medium    Aortic atherosclerosis ([place]) 05/05/2018    Priority: Low   DJD (degenerative joint disease)     Priority: Low   Arthritis     Priority: Low   Neck pain 02/08/2017    Priority: Low   Venous insufficiency 12/29/2016    Priority: Low   Ganglion cyst of tendon sheath of right hand 12/29/2016    Priority: Low   Avulsion fracture of metatarsal bone of right foot 11/25/2016    Priority: Low   Obesity, Class I, BMI 30-34.9 10/03/2015    Priority: Low   Allergic rhinitis 05/31/2014    Priority: Low   Fatigue 11/09/2013    Priority: Low   Dizziness 08/23/2013    Priority: Low   RLS (restless legs syndrome) 01/03/2013    Priority: Low   Chronic bilateral low back pain with left-sided sciatica 10/09/2011    Priority: Low   OSTEOARTHRITIS, LOWER LEG, LEFT 10/04/2009    Priority: Low   ANEMIA, IRON DEFICIENCY 11/06/2008    Priority: Low   TINNITUS, CHRONIC, 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    Priority: Low   OSA (obstructive sleep apnea) 11/02/2006    Priority: Low   GERD 10/12/2006    Priority: Low   Greater trochanteric bursitis of right  hip 02/17/2021   Hyperactive bowel sounds 01/29/2021   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 ([place]) 09/25/2020   Primary osteoarthritis of left knee 06/18/2020   Pre-operative respiratory examination 06/05/2020   Coronary artery disease involving coronary bypass graft of native heart without angina pectoris 04/07/2020   Trigger thumb, left thumb 01/24/2020   Left lateral epicondylitis 09/05/2019   Degenerative arthritis of left knee 08/08/2019   Educated about COVID-19 virus infection 04/20/2019   Unstable angina (HCC)    Greater trochanteric bursitis of left hip 10/27/2018   Degenerative disc  disease, lumbar 10/27/2018   Abdominal muscle strain, initial encounter 06/16/2018   Weakness of left leg 06/21/2017   Hereditary and idiopathic peripheral neuropathy 09/25/2014   Generalized abdominal pain 01/31/2009    Medications- reviewed and updated Current Outpatient Medications  Medication Sig Dispense Refill   acetaminophen (TYLENOL) 500 MG tablet Take 1,000 mg by mouth every 6 (six) hours as needed for moderate pain or headache.     albuterol (PROAIR HFA) 108 (90 BASE) MCG/ACT inhaler Inhale 1-2 puffs into the lungs every 6 (six) hours as needed for wheezing or shortness of breath. 18 g 3   Alirocumab (PRALUENT) 150 MG/ML SOAJ INJECT 1 DOSE INTO THE SKIN EVERY 14 (FOURTEEN) DAYS. 2 mL 11   amLODipine (NORVASC) 5 MG tablet Take 1 tablet (5 mg total) by mouth daily. 90 tablet 3   aspirin EC 81 MG tablet Take 1 tablet (81 mg total) by mouth 2 (two) times daily after a meal. 30 tablet 11   benzonatate (TESSALON) 100 MG capsule Take 1 capsule (100 mg total) by mouth every 8 (eight) hours. 21 capsule 0   busPIRone (BUSPAR) 5 MG tablet Take 1 tablet (5 mg total) by mouth 2 (two) times daily as needed (anxiety). 40 tablet 2   cholecalciferol (VITAMIN D3) 25 MCG (1000 UNIT) tablet Take 1,000 Units by mouth daily.     clotrimazole-betamethasone (LOTRISONE) cream Apply 1 application topically See admin instructions. Twice daily every 4 days     Cyanocobalamin (B-12) 2500 MCG TABS Take 1,250 mcg by mouth daily.     diazepam (VALIUM) 5 MG tablet Take 1 tablet (5 mg total) by mouth at bedtime as needed (sleep). 30 tablet 1   EPINEPHrine 0.3 mg/0.3 mL IJ SOAJ injection Inject 0.3 mg into the muscle as needed for anaphylaxis. for allergic reaction     ezetimibe (ZETIA) 10 MG tablet Take 10 mg by mouth daily.     FLOVENT HFA 110 MCG/ACT inhaler Inhale 1 puff into the lungs 2 (two) times daily.   5   fluticasone (FLONASE) 50 MCG/ACT nasal spray Place 1 spray into both nostrils 2 (two) times daily.       folic acid (FOLVITE) 111 MCG tablet Take 800 mcg by mouth daily.      gabapentin (NEURONTIN) 300 MG capsule Take 1 cap in AM, 1 cap at noon, 2 caps at bedtime (Patient taking differently: Take 300-600 mg by mouth See admin instructions. Take 300 mg in the morning, 300 mg at noon, and 600 mg at bedtime) 360 capsule 3   Glucos-Chond-Hyal Ac-Ca Fructo (MOVE FREE JOINT HEALTH ADVANCE PO) Take 1 tablet by mouth daily.     hydrochlorothiazide (HYDRODIURIL) 25 MG tablet TAKE 1 TABLET BY MOUTH EVERY DAY 90 tablet 2   ketoconazole (NIZORAL) 2 % cream Apply 1 application topically See admin instructions. Twice a day every other day     loratadine (CLARITIN)  10 MG tablet Take 10 mg by mouth at bedtime.      Miconazole Nitrate (LOTRIMIN AF DEODORANT POWDER) 2 % AERP Apply 1 spray topically daily as needed (jock itch).     Na Sulfate-K Sulfate-Mg Sulf 17.5-3.13-1.6 GM/177ML SOLN USE AS DIRECTED 354 mL 0   nitroGLYCERIN (NITROSTAT) 0.4 MG SL tablet PLACE 1 TABLET UNDER THE TONGUE EVERY 5 (FIVE) MINUTES AS NEEDED. 75 tablet 1   nystatin-triamcinolone ointment (MYCOLOG) Apply 1 application topically See admin instructions. Twice daily every 4 days     pantoprazole (PROTONIX) 40 MG tablet TAKE 1 TABLET BY MOUTH EVERY DAY 90 tablet 3   polyethylene glycol powder (GLYCOLAX/MIRALAX) powder Take 17 g by mouth daily. 255 g 0   predniSONE (DELTASONE) 20 MG tablet Take 1 tablet (20 mg total) by mouth daily for 5 days. 5 tablet 0   tiZANidine (ZANAFLEX) 4 MG tablet Take 1 tablet (4 mg total) by mouth every 6 (six) hours as needed for muscle spasms. 40 tablet 1   valsartan (DIOVAN) 320 MG tablet Take 1 tablet (320 mg total) by mouth daily. 90 tablet 3   Vibegron (GEMTESA) 75 MG TABS Take 75 mg by mouth daily. 30 tablet 11   Wheat Dextrin (BENEFIBER) POWD Take 1 Dose by mouth daily. 1 dose = 2 teaspoons     amoxicillin (AMOXIL) 500 MG tablet Take 1 tablet by mouth 2 (two) times daily.     No current facility-administered  medications for this visit.     Objective:  BP (!) 143/83    Pulse (!) 106    Ht 5\' 5"  (1.651 m)    Wt 195 lb (88.5 kg)    SpO2 95%    BMI 32.45 kg/m  self reported vitals Gen: NAD, resting comfortably Lungs: nonlabored, normal respiratory rate  Skin: appears dry, no obvious rash      Assessment and Plan   # COVID +  S:Pt was seen in ED yesterday (04/08/2021) presented with chest tightness, fatigue, cough and body aches. He started on antiviral medications on  04/05/2021 and slightly improved the next day however on 04/07/21 he felt poorly again. PT reported at ED visit, he experienced coughing and felt a "rattling" in his chest. He also included being very sleepy. Patient denied any chills, fever, vomiting or nausea.  -Chest x-ray was ordered and interpreted by me.  This showed no signs of pneumonia or other process.   -Given DuoNeb ( no change within symptoms) - also discharged with treatment for cough and steroid burst.  -   Patient feels that he is improving at this time.  Now on day 6 with first day of symptoms on the 12th.  Unfortunately his wife is now ill A/P: Much improved-patient will continue to monitor and complete current treatments including steroids-has already finished antiviral.  Heart rate mildly elevated but since he is feeling better I think it is reasonable to monitor-if worsens should seek care  #Incontinence/overactive bladder- in patient with history of prostate/bladder cancer fully treated S:Issues date back to prostatectomy and bladder cancer cautery in 2012. Worsened symptoms in 2021 and wanted to try medication-recommended against oxybutynin due to beers list and potential increased fatigue-opted to try Myrbetriq- tried in past and minimally effective- last tried in 10/2016 and then again in 2021 but minimally effective and over $100 a month. Denies history of retention outside of hospitals.  A/P: Patient with ongoing issues from overactive bladder and even  incontinence related to this.  Myrbetriq  has been tried twice and minimally effective despite expense.  We are going to write for gemtesa.  Benefits versus risks discussed today.  Hopefully this will be covered but he may follow-up with [facility] pharmacist to see if there are other options to bring down the cost even if not  #Hypertension S: Compliant with amlodipine 5 mg daily, hydrochlorothiazide 25 mg every day and valsartan 320mg  daily  BP Readings from Last 3 Encounters:  04/09/21 (!) 143/83  04/08/21 (!) 144/62  04/04/21 130/70  A/P: Blood pressure mildly elevated but in light of being on prednisone and feeling ill-I am okay with very mild elevation for now-continue current medication  Recommended follow up: As needed for new or worsening symptoms.  Otherwise keep February visit with me Future Appointments  Date Time Provider [place]  04/21/2021  9:30 AM [facility] [place] [facility]  04/23/2021 11:00 AM Marijo Sanes, PT [facility]  05/07/2021  9:00 AM Pyrtle, Lajuan Lines, MD [facility]  05/08/2021  9:45 AM Lyndal Pulley, DO [facility] None  05/13/2021  9:40 AM [name], MD [facility]  05/15/2021  9:30 AM [facility] HEALTH COACH [facility]  05/26/2021  8:15 AM Hilty, Nadean Corwin, MD [facility]  06/09/2021  9:15 AM Chesley Mires, MD [facility] None    Lab/Order associations:   ICD-10-CM   1. Overactive bladder  N32.81     2. COVID-19  U07.1     3. Essential hypertension  I10       Meds ordered this encounter  Medications   Vibegron (GEMTESA) 75 MG TABS    Sig: Take 75 mg by mouth daily.    Dispense:  30 tablet    Refill:  11   I,Jada Bradford,acting as a scribe for [name], MD.,have documented all relevant documentation on the behalf of [name], MD,as directed by  [name], MD while in the presence of [name], MD.   I, [name], MD, have reviewed all documentation for this visit. The documentation on 04/09/21 for  the exam, diagnosis, procedures, and orders are all accurate and complete.   Return precautions advised.  [name], MD

## 2021-04-04 NOTE — Patient Instructions (Signed)
Meds have been sent the the pharmacy °You can take tylenol for pain/fevers °If worsening symptoms, let us know or go to the Emergency room  ° ° °

## 2021-04-04 NOTE — Progress Notes (Signed)
Subjective:     Patient ID: Joshua [name], male    DOB: 05-11-1940, 81 y.o.   MRN: [id number]  Chief Complaint  Patient presents with   Cough    Started yesterday coughing so much that has caused chest discomfort Took tylenol     HPI-here w/wife Coughing a lot since yesterday afternoon.  Dry. No f/c./sob/n/v/d.  slight runny nose. Chills over weekend-but UTI Neg covid x2 this am. Feels poorly woke up 4am.  Has had all 5 immunizations  Dec taste since UTI started.    There are no preventive care reminders to display for this patient.  Past Medical History:  Diagnosis Date   Acute medial meniscal tear    Anemia 2015   Arthritis    "middle finger right hand; right knee; neck" (02/06/2014)   Asthma    seasonal   Bladder cancer ([place]) 04/2010   "cauterized during prostate OR"   CAD (coronary artery disease)    CABG 2001   Diverticulosis    DJD (degenerative joint disease)    BACK   GERD (gastroesophageal reflux disease)    H/O inguinal hernia repair 12/2018   History of gout    Hyperlipidemia    Hypertension    IBS (irritable bowel syndrome)    Obesity    OSA (obstructive sleep apnea)    USES CPAP    Pancreatitis ~ 1980   Peripheral vascular disease ([place])    Pre-diabetes    Prostate cancer ([place]) 04/2010    Past Surgical History:  Procedure Laterality Date   ANTERIOR CERVICAL DECOMP/DISCECTOMY FUSION  05/26/2006   PLATE PLACED   BACK SURGERY     CARDIAC CATHETERIZATION  11/20/1999   CORONARY ARTERY BYPASS GRAFT  11/20/1999   SVG-RI1-RI2, SVG-OM, SVG-dRCA   CORONARY STENT INTERVENTION N/A 04/13/2019   Procedure: CORONARY STENT INTERVENTION;  Surgeon: Burnell Blanks, MD;  Location: [facility];  Service: Cardiovascular;  Laterality: N/A;   HERNIA REPAIR     INGUINAL HERNIA REPAIR Left 01/02/2019   Procedure: OPEN LEFT INGUINAL HERNIA REPAIR WITH MESH;  Surgeon: Johnathan Hausen, MD;  Location: [facility];  Service: General;  Laterality: Left;    KNEE ARTHROSCOPY Right 1992; 04/2008   LAPAROSCOPIC CHOLECYSTECTOMY  2007   LEFT HEART CATH AND CORS/GRAFTS ANGIOGRAPHY N/A 04/13/2019   Procedure: LEFT HEART CATH AND CORS/GRAFTS ANGIOGRAPHY;  Surgeon: Burnell Blanks, MD;  Location: [facility];  Service: Cardiovascular;  Laterality: N/A;   NASAL SEPTUM SURGERY Right 2007   ROBOT ASSISTED LAPAROSCOPIC RADICAL PROSTATECTOMY  04/2010   THYROIDECTOMY, PARTIAL  1980   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   Procedure: LEFT TOTAL KNEE ARTHROPLASTY;  Surgeon: Melrose Nakayama, MD;  Location: [facility];  Service: Orthopedics;  Laterality: Left;    Outpatient Medications Prior to Visit  Medication Sig Dispense Refill   acetaminophen (TYLENOL) 500 MG tablet Take 1,000 mg by mouth every 6 (six) hours as needed for moderate pain or headache.     albuterol (PROAIR HFA) 108 (90 BASE) MCG/ACT inhaler Inhale 1-2 puffs into the lungs every 6 (six) hours as needed for wheezing or shortness of breath. 18 g 3   Alirocumab (PRALUENT) 150 MG/ML SOAJ INJECT 1 DOSE INTO THE SKIN EVERY 14 (FOURTEEN) DAYS. 2 mL 11   amLODipine (NORVASC) 5 MG tablet Take 1 tablet (5 mg total) by mouth daily. 90 tablet 3   amoxicillin (AMOXIL) 500 MG tablet Take 1 tablet by mouth 2 (two) times daily.  aspirin EC 81 MG tablet Take 1 tablet (81 mg total) by mouth 2 (two) times daily after a meal. 30 tablet 11   busPIRone (BUSPAR) 5 MG tablet Take 1 tablet (5 mg total) by mouth 2 (two) times daily as needed (anxiety). 40 tablet 2   cholecalciferol (VITAMIN D3) 25 MCG (1000 UNIT) tablet Take 1,000 Units by mouth daily.     clotrimazole-betamethasone (LOTRISONE) cream Apply 1 application topically See admin instructions. Twice daily every 4 days     Cyanocobalamin (B-12) 2500 MCG TABS Take 1,250 mcg by mouth daily.     diazepam (VALIUM) 5 MG tablet Take 1 tablet (5 mg total) by mouth at bedtime as needed (sleep). 30 tablet 1   EPINEPHrine 0.3 mg/0.3 mL IJ SOAJ injection Inject  0.3 mg into the muscle as needed for anaphylaxis. for allergic reaction     ezetimibe (ZETIA) 10 MG tablet Take 10 mg by mouth daily.     FLOVENT HFA 110 MCG/ACT inhaler Inhale 1 puff into the lungs 2 (two) times daily.   5   fluticasone (FLONASE) 50 MCG/ACT nasal spray Place 1 spray into both nostrils 2 (two) times daily.      folic acid (FOLVITE) 387 MCG tablet Take 800 mcg by mouth daily.      gabapentin (NEURONTIN) 300 MG capsule Take 1 cap in AM, 1 cap at noon, 2 caps at bedtime (Patient taking differently: Take 300-600 mg by mouth See admin instructions. Take 300 mg in the morning, 300 mg at noon, and 600 mg at bedtime) 360 capsule 3   Glucos-Chond-Hyal Ac-Ca Fructo (MOVE FREE JOINT HEALTH ADVANCE PO) Take 1 tablet by mouth daily.     hydrochlorothiazide (HYDRODIURIL) 25 MG tablet TAKE 1 TABLET BY MOUTH EVERY DAY 90 tablet 2   ketoconazole (NIZORAL) 2 % cream Apply 1 application topically See admin instructions. Twice a day every other day     loratadine (CLARITIN) 10 MG tablet Take 10 mg by mouth at bedtime.      Miconazole Nitrate (LOTRIMIN AF DEODORANT POWDER) 2 % AERP Apply 1 spray topically daily as needed (jock itch).     Na Sulfate-K Sulfate-Mg Sulf 17.5-3.13-1.6 GM/177ML SOLN USE AS DIRECTED 354 mL 0   nitroGLYCERIN (NITROSTAT) 0.4 MG SL tablet PLACE 1 TABLET UNDER THE TONGUE EVERY 5 (FIVE) MINUTES AS NEEDED. 75 tablet 1   nystatin-triamcinolone ointment (MYCOLOG) Apply 1 application topically See admin instructions. Twice daily every 4 days     pantoprazole (PROTONIX) 40 MG tablet TAKE 1 TABLET BY MOUTH EVERY DAY 90 tablet 3   polyethylene glycol powder (GLYCOLAX/MIRALAX) powder Take 17 g by mouth daily. 255 g 0   tiZANidine (ZANAFLEX) 4 MG tablet Take 1 tablet (4 mg total) by mouth every 6 (six) hours as needed for muscle spasms. 40 tablet 1   valsartan (DIOVAN) 320 MG tablet Take 1 tablet (320 mg total) by mouth daily. 90 tablet 3   Wheat Dextrin (BENEFIBER) POWD Take 1 Dose by  mouth daily. 1 dose = 2 teaspoons     No facility-administered medications prior to visit.    Allergies  Allergen Reactions   Benadryl [Diphenhydramine] Other (See Comments)    Pt told not to take because of bypass surgery   Bromfed Nausea And Vomiting   Clarithromycin Other (See Comments)     gastritis   Codeine Other (See Comments)    Trouble breathing   Doxycycline Hyclate Nausea And Vomiting   Lexapro [Escitalopram]  Dizziness   Modafinil Other (See Comments)     anxiety-nervousness   Oxycodone-Acetaminophen Itching   Promethazine Hcl Other (See Comments)     fainting   Quinolones Nausea Only    Cipro "felt real bad"   Telithromycin Nausea And Vomiting   Tramadol Nausea Only   Hydrocodone-Acetaminophen Rash   Statins Other (See Comments)    Leg cramps and makes patient feel bad. Per pt tried multiple in years past   Sulfonamide Derivatives Rash   WER:[id number] except as noted in HPI      Objective:     BP 130/70    Pulse 84    Temp (!) 97.5 F (36.4 C) (Temporal)    Ht 5' 5.25" (1.657 m)    Wt 204 lb (92.5 kg)    SpO2 93%    BMI 33.69 kg/m  Wt Readings from Last 3 Encounters:  04/04/21 204 lb (92.5 kg)  03/31/21 204 lb 2 oz (92.6 kg)  03/28/21 215 lb (97.5 kg)        Gen: WDWN NAD OWM HEENT: NCAT, conjunctiva not injected, sclera nonicteric TM WNL B, OP moist, no exudates  NECK:  supple, no thyromegaly, no nodes, no carotid bruits CARDIAC: RRR, S1S2+, no murmur. DP 2+B LUNGS: CTAB. No wheezes ABDOMEN:  BS+, soft, NTND, No HSM, no masses EXT:  no edema MSK: no gross abnormalities. 5 NEURO: A&O x3.  CN II-XII intact.  PSYCH: normal mood. Good eye contact  Results for orders placed or performed in visit on 04/04/21  POCT Influenza A/B  Result Value Ref Range   Influenza A, POC Negative Negative   Influenza B, POC Negative Negative   *Note: Due to a large number of results and/or encounters for the requested time period, some  results have not been displayed. A complete set of results can be found in Results Review.     Assessment & Plan:   Problem List Items Addressed This Visit   None Visit Diagnoses     Acute cough    -  Primary   Relevant Orders   DG Chest 2 View      Cough-?covid(rx to hold).  Check in am.  Other.  Check cxr.  Worse, etc, to ER  Meds ordered this encounter  Medications   molnupiravir EUA (LAGEVRIO) 200 mg CAPS capsule    Sig: Take 4 capsules (800 mg total) by mouth 2 (two) times daily for 5 days.    Dispense:  40 capsule    Refill:  0    Wellington Hampshire, MD

## 2021-04-08 ENCOUNTER — Other Ambulatory Visit: Payer: Self-pay

## 2021-04-08 ENCOUNTER — Emergency Department (HOSPITAL_BASED_OUTPATIENT_CLINIC_OR_DEPARTMENT_OTHER)
Admission: EM | Admit: 2021-04-08 | Discharge: 2021-04-08 | Disposition: A | Discharge status: Home / Self Care | Payer: PPO | Attending: Emergency Medicine | Admitting: Emergency Medicine

## 2021-04-08 ENCOUNTER — Emergency Department (HOSPITAL_BASED_OUTPATIENT_CLINIC_OR_DEPARTMENT_OTHER): Payer: PPO

## 2021-04-08 ENCOUNTER — Encounter (HOSPITAL_BASED_OUTPATIENT_CLINIC_OR_DEPARTMENT_OTHER): Payer: Self-pay

## 2021-04-08 DIAGNOSIS — U071 COVID-19: Secondary | ICD-10-CM | POA: Insufficient documentation

## 2021-04-08 DIAGNOSIS — N189 Chronic kidney disease, unspecified: Secondary | ICD-10-CM | POA: Insufficient documentation

## 2021-04-08 DIAGNOSIS — J45909 Unspecified asthma, uncomplicated: Secondary | ICD-10-CM | POA: Diagnosis not present

## 2021-04-08 DIAGNOSIS — Z7982 Long term (current) use of aspirin: Secondary | ICD-10-CM | POA: Insufficient documentation

## 2021-04-08 DIAGNOSIS — R0789 Other chest pain: Secondary | ICD-10-CM | POA: Diagnosis not present

## 2021-04-08 DIAGNOSIS — R059 Cough, unspecified: Secondary | ICD-10-CM | POA: Diagnosis not present

## 2021-04-08 DIAGNOSIS — R0602 Shortness of breath: Secondary | ICD-10-CM | POA: Diagnosis not present

## 2021-04-08 MED ORDER — BENZONATATE 100 MG PO CAPS: 100.0000 mg | ORAL_CAPSULE | Freq: Three times a day (TID) | ORAL | 0 refills | Status: DC

## 2021-04-08 MED ORDER — PREDNISONE 20 MG PO TABS: 20.0000 mg | ORAL_TABLET | Freq: Every day | ORAL | 0 refills | Status: AC

## 2021-04-08 MED ORDER — IPRATROPIUM-ALBUTEROL 0.5-2.5 (3) MG/3ML IN SOLN
3.0000 mL | Freq: Once | RESPIRATORY_TRACT | Status: AC
  Administered 2021-04-08: 3 mL via RESPIRATORY_TRACT
  Filled 2021-04-08: qty 3

## 2021-04-08 NOTE — Discharge Instructions (Addendum)
You do not have any pneumonia.  Please take the steroid medication sent to your pharmacy.  You may also take the cough medication when you need it.  Please follow-up with your primary care provider as planned tomorrow.

## 2021-04-08 NOTE — ED Triage Notes (Signed)
Pt states he tested +covid 1/14-is taking po med-states he felt better/ feels worse started yesterday-NAD-steady gait

## 2021-04-08 NOTE — ED Provider Notes (Signed)
[facility] Provider Note   CSN: [id number] Arrival date & time: 04/08/21  1545     History  Chief Complaint  Patient presents with   Covid Positive    Joshua [name] is a 81 y.o. male with a past medical history of chronic kidney disease, asthma and COVID-19 diagnosed 1/14 presenting today with complaint of cough, body aches, fatigue and chest tightness.  He was started on antifungal medication on Saturday, felt much better on Sunday and yesterday he began to feel poorly again, noting fatigue.  Today he began to have a cough and feeling "rattling" in his chest.  Also reports being very sleepy.  Denies fever, chills, nausea or vomiting or any other leftover symptoms from COVID-19.  Home Medications Prior to Admission medications   Medication Sig Start Date End Date Taking? Authorizing Provider  acetaminophen (TYLENOL) 500 MG tablet Take 1,000 mg by mouth every 6 (six) hours as needed for moderate pain or headache.    [provider]  albuterol (PROAIR HFA) 108 (90 BASE) MCG/ACT inhaler Inhale 1-2 puffs into the lungs every 6 (six) hours as needed for wheezing or shortness of breath. 10/19/14   [name], MD  Alirocumab (PRALUENT) 150 MG/ML SOAJ INJECT 1 DOSE INTO THE SKIN EVERY 14 (FOURTEEN) DAYS. 01/14/21   Hilty, Nadean Corwin, MD  amLODipine (NORVASC) 5 MG tablet Take 1 tablet (5 mg total) by mouth daily. 07/15/20   Hilty, Nadean Corwin, MD  amoxicillin (AMOXIL) 500 MG tablet Take 1 tablet by mouth 2 (two) times daily. 03/04/21   [provider]  aspirin EC 81 MG tablet Take 1 tablet (81 mg total) by mouth 2 (two) times daily after a meal. 06/18/20   Loni Dolly, PA-C  busPIRone (BUSPAR) 5 MG tablet Take 1 tablet (5 mg total) by mouth 2 (two) times daily as needed (anxiety). 01/01/21   [name], MD  cholecalciferol (VITAMIN D3) 25 MCG (1000 UNIT) tablet Take 1,000 Units by mouth daily.    [provider]   clotrimazole-betamethasone (LOTRISONE) cream Apply 1 application topically See admin instructions. Twice daily every 4 days    [provider]  Cyanocobalamin (B-12) 2500 MCG TABS Take 1,250 mcg by mouth daily.    [provider]  diazepam (VALIUM) 5 MG tablet Take 1 tablet (5 mg total) by mouth at bedtime as needed (sleep). 10/26/20   [name], MD  EPINEPHrine 0.3 mg/0.3 mL IJ SOAJ injection Inject 0.3 mg into the muscle as needed for anaphylaxis. for allergic reaction 04/24/19   [provider]  ezetimibe (ZETIA) 10 MG tablet Take 10 mg by mouth daily. 02/25/21   [provider]  FLOVENT HFA 110 MCG/ACT inhaler Inhale 1 puff into the lungs 2 (two) times daily.  07/17/14   [provider]  fluticasone (FLONASE) 50 MCG/ACT nasal spray Place 1 spray into both nostrils 2 (two) times daily.  09/05/12   Ricard Dillon, MD  folic acid (FOLVITE) 779 MCG tablet Take 800 mcg by mouth daily.     [provider]  gabapentin (NEURONTIN) 300 MG capsule Take 1 cap in AM, 1 cap at noon, 2 caps at bedtime Patient taking differently: Take 300-600 mg by mouth See admin instructions. Take 300 mg in the morning, 300 mg at noon, and 600 mg at bedtime 05/01/20   Cameron Sprang, MD  Glucos-Chond-Hyal Ac-Ca Fructo (MOVE FREE JOINT HEALTH ADVANCE PO) Take 1 tablet by mouth daily.  [provider]  hydrochlorothiazide (HYDRODIURIL) 25 MG tablet TAKE 1 TABLET BY MOUTH EVERY DAY 10/03/20   [name], MD  ketoconazole (NIZORAL) 2 % cream Apply 1 application topically See admin instructions. Twice a day every other day 02/01/19   [provider]  loratadine (CLARITIN) 10 MG tablet Take 10 mg by mouth at bedtime.     [provider]  Miconazole Nitrate (LOTRIMIN AF DEODORANT POWDER) 2 % AERP Apply 1 spray topically daily as needed (jock itch).    [provider]  molnupiravir EUA (LAGEVRIO) 200 mg CAPS capsule Take 4 capsules  (800 mg total) by mouth 2 (two) times daily for 5 days. 04/04/21 04/09/21  Tawnya Crook, MD  Na Sulfate-K Sulfate-Mg Sulf 17.5-3.13-1.6 GM/177ML SOLN USE AS DIRECTED 02/24/21   Pyrtle, Lajuan Lines, MD  nitroGLYCERIN (NITROSTAT) 0.4 MG SL tablet PLACE 1 TABLET UNDER THE TONGUE EVERY 5 (FIVE) MINUTES AS NEEDED. 03/11/20   [name], MD  nystatin-triamcinolone ointment [facility]) Apply 1 application topically See admin instructions. Twice daily every 4 days    [provider]  pantoprazole (PROTONIX) 40 MG tablet TAKE 1 TABLET BY MOUTH EVERY DAY 06/25/20   Hilty, Nadean Corwin, MD  polyethylene glycol powder (GLYCOLAX/MIRALAX) powder Take 17 g by mouth daily. 08/29/14   Tanna Furry, MD  tiZANidine (ZANAFLEX) 4 MG tablet Take 1 tablet (4 mg total) by mouth every 6 (six) hours as needed for muscle spasms. 06/18/20 06/18/21  Loni Dolly, PA-C  valsartan (DIOVAN) 320 MG tablet Take 1 tablet (320 mg total) by mouth daily. 08/08/20   [name], MD  Wheat Dextrin (BENEFIBER) POWD Take 1 Dose by mouth daily. 1 dose = 2 teaspoons    [provider]      Allergies    Benadryl [diphenhydramine], Bromfed, Clarithromycin, Codeine, Doxycycline hyclate, Lexapro [escitalopram], Modafinil, Oxycodone-acetaminophen, Promethazine hcl, Quinolones, Telithromycin, Tramadol, Hydrocodone-acetaminophen, Statins, and Sulfonamide derivatives    Review of Systems   Review of Systems  Physical Exam Updated Vital Signs BP (!) 164/81 (BP Location: Right Arm)    Pulse 88    Temp 97.8 F (36.6 C) (Oral)    Resp 20    SpO2 98%  Physical Exam Vitals and nursing note reviewed.  Constitutional:      General: He is not in acute distress.    Appearance: Normal appearance. He is not ill-appearing.  HENT:     Head: Normocephalic and atraumatic.     Nose: Nose normal.     Mouth/Throat:     Mouth: Mucous membranes are moist.     Pharynx: Oropharynx is clear.  Eyes:     General: No scleral icterus.     Conjunctiva/sclera: Conjunctivae normal.  Cardiovascular:     Rate and Rhythm: Normal rate and regular rhythm.  Pulmonary:     Effort: Pulmonary effort is normal. No respiratory distress.     Breath sounds: Rhonchi (Right upper and right lower) present. No rales.  Skin:    Findings: No rash.  Neurological:     Mental Status: He is alert.  Psychiatric:        Mood and Affect: Mood normal.    ED Results / Procedures / Treatments   Labs (all labs ordered are listed, but only abnormal results are displayed) Labs Reviewed - No data to display  EKG None  Radiology No results found.  Procedures Procedures    Medications Ordered in ED Medications - No data to display  ED Course/ Medical  Decision Making/ A&P                           Medical Decision Making Amount and/or Complexity of Data Reviewed Radiology: ordered.  Risk Prescription drug management.   81 year old male presenting today with a complaint of continued cough, body aches, fatigue and chest tightness.  Is currently being treated for COVID-19 with antiviral.  He developed a cough this morning and wanted to make sure it is not a pneumonia.  Denied any fevers or chills.  Imaging: Chest x-ray was ordered and interpreted by me.  This shows no signs of pneumonia or other process, I agree with the radiologist.    Treatment: Patient was given DuoNeb and reports no change in his symptoms.  Testing considered: D-dimer and troponin.  I decided against these due to patient's other symptoms such as body aches and fatigue as well as his COVID-19 diagnoses.  More likely that symptoms are related to URI and not ACS or PE.  Low heart score and low Wells likelihood of PE.  Disposition: I believe the patient is stable for discharge home with treatment for cough and steroid burst.  He already has an at home inhaler for his asthma.  He reports that he already has a virtual visit with his primary care provider tomorrow.  I believe  it is reasonable for him to be discharged with follow-up precautions and his appointment tomorrow.        Final Clinical Impression(s) / ED Diagnoses Final diagnoses:  COVID-19    Rx / DC Orders Results and diagnoses were explained to the patient. Return precautions discussed in full. Patient had no additional questions and expressed complete understanding.   This chart was dictated using voice recognition software.  Despite best efforts to proofread,  errors can occur which can change the documentation meaning.    Darliss Ridgel 04/08/21 1737    Davonna Belling, MD 04/08/21 2347

## 2021-04-08 NOTE — ED Notes (Signed)
No acute distress noted upon this RN's departure of patient. Verified discharge paperwork with name and DOB. Vital signs stable. Patient taken to checkout window. Discharge paperwork discussed with patient. No further questions voiced upon discharge.

## 2021-04-09 ENCOUNTER — Encounter: Payer: Self-pay | Admitting: Family Medicine

## 2021-04-09 ENCOUNTER — Telehealth (INDEPENDENT_AMBULATORY_CARE_PROVIDER_SITE_OTHER): Payer: PPO | Admitting: Family Medicine

## 2021-04-09 ENCOUNTER — Telehealth: Payer: Self-pay | Admitting: Family Medicine

## 2021-04-09 VITALS — BP 143/83 | HR 106 | Ht 65.0 in | Wt 195.0 lb

## 2021-04-09 DIAGNOSIS — U071 COVID-19: Secondary | ICD-10-CM

## 2021-04-09 DIAGNOSIS — N3281 Overactive bladder: Secondary | ICD-10-CM | POA: Diagnosis not present

## 2021-04-09 DIAGNOSIS — F324 Major depressive disorder, single episode, in partial remission: Secondary | ICD-10-CM

## 2021-04-09 DIAGNOSIS — I25810 Atherosclerosis of coronary artery bypass graft(s) without angina pectoris: Secondary | ICD-10-CM

## 2021-04-09 DIAGNOSIS — I10 Essential (primary) hypertension: Secondary | ICD-10-CM | POA: Diagnosis not present

## 2021-04-09 DIAGNOSIS — N183 Chronic kidney disease, stage 3 unspecified: Secondary | ICD-10-CM

## 2021-04-09 MED ORDER — GEMTESA 75 MG PO TABS: 75.0000 mg | ORAL_TABLET | Freq: Every day | ORAL | 11 refills | Status: DC

## 2021-04-09 NOTE — Telephone Encounter (Signed)
Pt was seen at ED 04/08/21 and has a virtual appt. Scheduled this morning, 04/09/21 @ 9:20am   Patient Name: Joshua [name] Gender: Male DOB: 03-01-1941 Age: 81 Y 1 M 25 D Return Phone Number: [phone] (Primary), [phone] (Secondary) Address: City/ State/ Zip: [place]  [address] Client [facility] [place] [facility] Provider [name]- MD Contact Type Call Who Is Calling Patient / Member / Family / Caregiver Call Type Triage / Clinical Relationship To Patient Self Return Phone Number [phone] (Primary) Chief Complaint CHEST PAIN - pain, pressure, heaviness or tightness Reason for Call Symptomatic / Request for [facility] states he has a virtual appt. tomorrow morning. He has covid and tested positive Saturday. He is coughing, fatigued, and having chest pains. Translation No Nurse Assessment Nurse: Adrian Blackwater, RN, Claiborne Billings Date/Time Eilene Ghazi Time): 04/08/2021 3:04:25 PM Confirm and document reason for call. If symptomatic, describe symptoms. ---Pt states he tested positive for Covid Saturday after his sx began Thursday. He has an appt tomorrow. His current sx are cough, chest congestion, fatigue, but no fever. O2 sat 95% Does the patient have any new or worsening symptoms? ---Yes Will a triage be completed? ---Yes Related visit to physician within the last 2 weeks? ---Yes Does the PT have any chronic conditions? (i.e. diabetes, asthma, this includes High risk factors for pregnancy, etc.) ---Yes List chronic conditions. ---Heart disease, HTN, asthma, allergies Is this a behavioral health or substance abuse call? ---No Guidelines Guideline Title Affirmed Question Affirmed Notes Nurse Date/Time (Eastern Time) COVID-19 - Diagnosed or Suspected Chest pain or pressure (Exception: MILD central chest Gigi Gin 04/08/2021  3:07:34 PM  Guidelines Guideline Title Affirmed Question Affirmed Notes Nurse Date/Time Eilene Ghazi Time) pain, present only when coughing) Disp. Time Eilene Ghazi Time) Disposition Final User 04/08/2021 3:01:11 PM Send to Urgent Gwenlyn Found, Blyn 04/08/2021 3:12:31 PM Go to ED Now (or PCP triage) Yes Adrian Blackwater, RN, Maxie Barb Disagree/Comply Comply Caller Understands Yes PreDisposition Call Doctor Care Advice Given Per Guideline GO TO ED NOW (OR PCP TRIAGE): * IF NO PCP (PRIMARY CARE PROVIDER) SECOND-LEVEL TRIAGE: You need to be seen within the next hour. Go to the [facility] at _____________ [place] as soon as you can. GENERAL CARE ADVICE FOR COVID-19 SYMPTOMS: * Cough: Use cough drops. * Feeling dehydrated: Drink extra liquids. If the air in your home is dry, use a humidifier. CARE ADVICE given per COVID-19 - DIAGNOSED OR SUSPECTED (Adult) guideline. Referrals [facility] - ED

## 2021-04-11 ENCOUNTER — Encounter: Payer: Self-pay | Admitting: Family Medicine

## 2021-04-11 ENCOUNTER — Telehealth: Payer: PPO | Admitting: Neurology

## 2021-04-14 ENCOUNTER — Telehealth: Payer: Self-pay | Admitting: Family Medicine

## 2021-04-14 DIAGNOSIS — G4733 Obstructive sleep apnea (adult) (pediatric): Secondary | ICD-10-CM | POA: Diagnosis not present

## 2021-04-14 NOTE — Telephone Encounter (Signed)
Pt states he had Covid and his symptoms have come back. He is wanting to know if he can get a refill on his meds or have something else called in.

## 2021-04-14 NOTE — Telephone Encounter (Signed)
Pt states that he should talk to you per Dr. Yong Channel.

## 2021-04-14 NOTE — Telephone Encounter (Signed)
Pt called back wanting to get a refill on meds. Pt states his symptoms have returned. Pt is scheduled with Dr Cherlynn Kaiser on 04/15/21 at [facility].   Pt received a letter from the pharmaceutical company that [employer]. The company wants a letter stating pt is eligible for this medication. Pt is bringing letter with him to his appointment for clarification.

## 2021-04-15 ENCOUNTER — Encounter: Payer: Self-pay | Admitting: Family Medicine

## 2021-04-15 ENCOUNTER — Other Ambulatory Visit: Payer: Self-pay

## 2021-04-15 ENCOUNTER — Ambulatory Visit (INDEPENDENT_AMBULATORY_CARE_PROVIDER_SITE_OTHER): Payer: PPO | Admitting: Family Medicine

## 2021-04-15 VITALS — BP 122/64 | HR 82 | Temp 97.4°F | Ht 65.25 in | Wt 202.2 lb

## 2021-04-15 DIAGNOSIS — U071 COVID-19: Secondary | ICD-10-CM

## 2021-04-15 DIAGNOSIS — R051 Acute cough: Secondary | ICD-10-CM

## 2021-04-15 NOTE — Telephone Encounter (Signed)
If patient brings form for me to look at I will be glad to!

## 2021-04-15 NOTE — Patient Instructions (Signed)
Meds have been sent the the pharmacy You can take tylenol for pain/fevers If worsening symptoms, let us know or go to the Emergency room   Flovent 2 puffs twice daily for 1 wk.

## 2021-04-15 NOTE — Progress Notes (Signed)
Subjective:     Patient ID: Joshua [name], male    DOB: 1940/06/04, 81 y.o.   MRN: [id number]  Chief Complaint  Patient presents with   Cough    Cough with phlegm    Fatigue    HPI-here w/wife Dx Covid.  Took meds started feeling better, but then worse so  Went to ER-given pred-but didn't sleep for 5 nights as wired.  Still not feeling well.  Joints stiff , tired all day-sleeping more.  No f/c. No vomiting.  Some diarrhea-mild.  No sob.  Still pain in chest-gurggling better today.  Cough still-can't get phlegm up.  Doesn't do well w/guaifenisin.    There are no preventive care reminders to display for this patient.  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   "cauterized during prostate OR"   CAD (coronary artery disease)    CABG 2001   Diverticulosis    DJD (degenerative joint disease)    BACK   GERD (gastroesophageal reflux disease)    H/O inguinal hernia repair 12/2018   History of gout    Hyperlipidemia    Hypertension    IBS (irritable bowel syndrome)    Obesity    OSA (obstructive sleep apnea)    USES CPAP    Pancreatitis ~ 1980   Peripheral vascular disease ([place])    Pre-diabetes    Prostate cancer ([place]) 04/2010    Past Surgical History:  Procedure Laterality Date   ANTERIOR CERVICAL DECOMP/DISCECTOMY FUSION  05/26/2006   PLATE PLACED   BACK SURGERY     CARDIAC CATHETERIZATION  11/20/1999   CORONARY ARTERY BYPASS GRAFT  11/20/1999   SVG-RI1-RI2, SVG-OM, SVG-dRCA   CORONARY STENT INTERVENTION N/A 04/13/2019   Procedure: CORONARY STENT INTERVENTION;  Surgeon: Burnell Blanks, MD;  Location: [facility];  Service: Cardiovascular;  Laterality: N/A;   HERNIA REPAIR     INGUINAL HERNIA REPAIR Left 01/02/2019   Procedure: OPEN LEFT INGUINAL HERNIA REPAIR WITH MESH;  Surgeon:  Johnathan Hausen, MD;  Location: [facility];  Service: General;  Laterality: Left;   KNEE ARTHROSCOPY Right 1992; 04/2008   LAPAROSCOPIC CHOLECYSTECTOMY  2007   LEFT HEART CATH AND CORS/GRAFTS ANGIOGRAPHY N/A 04/13/2019   Procedure: LEFT HEART CATH AND CORS/GRAFTS ANGIOGRAPHY;  Surgeon: Burnell Blanks, MD;  Location: [facility];  Service: Cardiovascular;  Laterality: N/A;   NASAL SEPTUM SURGERY Right 2007   ROBOT ASSISTED LAPAROSCOPIC RADICAL PROSTATECTOMY  04/2010   THYROIDECTOMY, PARTIAL  1980   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   Procedure: LEFT TOTAL KNEE ARTHROPLASTY;  Surgeon: Melrose Nakayama, MD;  Location: [facility];  Service: Orthopedics;  Laterality: Left;    Outpatient Medications Prior to Visit  Medication Sig Dispense Refill   acetaminophen (TYLENOL) 500 MG tablet Take 1,000 mg by mouth every 6 (six) hours as needed for moderate pain or headache.     albuterol (PROAIR HFA) 108 (90 BASE) MCG/ACT inhaler Inhale 1-2 puffs into the lungs every 6 (six) hours as needed for wheezing or shortness of breath. 18 g 3   Alirocumab (PRALUENT) 150 MG/ML SOAJ INJECT 1 DOSE INTO THE SKIN EVERY 14 (FOURTEEN) DAYS. 2 mL 11   amLODipine (NORVASC) 5 MG tablet Take 1 tablet (5 mg total) by mouth daily.  90 tablet 3   aspirin EC 81 MG tablet Take 1 tablet (81 mg total) by mouth 2 (two) times daily after a meal. 30 tablet 11   benzonatate (TESSALON) 100 MG capsule Take 1 capsule (100 mg total) by mouth every 8 (eight) hours. 21 capsule 0   busPIRone (BUSPAR) 5 MG tablet Take 1 tablet (5 mg total) by mouth 2 (two) times daily as needed (anxiety). 40 tablet 2   cholecalciferol (VITAMIN D3) 25 MCG (1000 UNIT) tablet Take 1,000 Units by mouth daily.     clotrimazole-betamethasone (LOTRISONE) cream Apply 1 application topically See admin instructions. Twice daily every 4 days     Cyanocobalamin (B-12) 2500 MCG TABS Take 1,250 mcg by mouth daily.     diazepam (VALIUM) 5 MG tablet Take 1 tablet (5  mg total) by mouth at bedtime as needed (sleep). 30 tablet 1   EPINEPHrine 0.3 mg/0.3 mL IJ SOAJ injection Inject 0.3 mg into the muscle as needed for anaphylaxis. for allergic reaction     ezetimibe (ZETIA) 10 MG tablet Take 10 mg by mouth daily.     FLOVENT HFA 110 MCG/ACT inhaler Inhale 1 puff into the lungs 2 (two) times daily.   5   fluticasone (FLONASE) 50 MCG/ACT nasal spray Place 1 spray into both nostrils 2 (two) times daily.      folic acid (FOLVITE) 629 MCG tablet Take 800 mcg by mouth daily.      gabapentin (NEURONTIN) 300 MG capsule Take 1 cap in AM, 1 cap at noon, 2 caps at bedtime (Patient taking differently: Take 300-600 mg by mouth See admin instructions. Take 300 mg in the morning, 300 mg at noon, and 600 mg at bedtime) 360 capsule 3   Glucos-Chond-Hyal Ac-Ca Fructo (MOVE FREE JOINT HEALTH ADVANCE PO) Take 1 tablet by mouth daily.     hydrochlorothiazide (HYDRODIURIL) 25 MG tablet TAKE 1 TABLET BY MOUTH EVERY DAY 90 tablet 2   ketoconazole (NIZORAL) 2 % cream Apply 1 application topically See admin instructions. Twice a day every other day     loratadine (CLARITIN) 10 MG tablet Take 10 mg by mouth at bedtime.      Miconazole Nitrate (LOTRIMIN AF DEODORANT POWDER) 2 % AERP Apply 1 spray topically daily as needed (jock itch).     Na Sulfate-K Sulfate-Mg Sulf 17.5-3.13-1.6 GM/177ML SOLN USE AS DIRECTED 354 mL 0   nitroGLYCERIN (NITROSTAT) 0.4 MG SL tablet PLACE 1 TABLET UNDER THE TONGUE EVERY 5 (FIVE) MINUTES AS NEEDED. 75 tablet 1   nystatin-triamcinolone ointment (MYCOLOG) Apply 1 application topically See admin instructions. Twice daily every 4 days     pantoprazole (PROTONIX) 40 MG tablet TAKE 1 TABLET BY MOUTH EVERY DAY 90 tablet 3   polyethylene glycol powder (GLYCOLAX/MIRALAX) powder Take 17 g by mouth daily. 255 g 0   tiZANidine (ZANAFLEX) 4 MG tablet Take 1 tablet (4 mg total) by mouth every 6 (six) hours as needed for muscle spasms. 40 tablet 1   valsartan (DIOVAN) 320 MG  tablet Take 1 tablet (320 mg total) by mouth daily. 90 tablet 3   Vibegron (GEMTESA) 75 MG TABS Take 75 mg by mouth daily. 30 tablet 11   Wheat Dextrin (BENEFIBER) POWD Take 1 Dose by mouth daily. 1 dose = 2 teaspoons     amoxicillin (AMOXIL) 500 MG tablet Take 1 tablet by mouth 2 (two) times daily.     No facility-administered medications prior to visit.    Allergies  Allergen Reactions  Benadryl [Diphenhydramine] Other (See Comments)    Pt told not to take because of bypass surgery   Bromfed Nausea And Vomiting   Clarithromycin Other (See Comments)     gastritis   Codeine Other (See Comments)    Trouble breathing   Doxycycline Hyclate Nausea And Vomiting   Lexapro [Escitalopram]     Dizziness   Modafinil Other (See Comments)     anxiety-nervousness   Oxycodone-Acetaminophen Itching   Promethazine Hcl Other (See Comments)     fainting   Quinolones Nausea Only    Cipro "felt real bad"   Telithromycin Nausea And Vomiting   Tramadol Nausea Only   Hydrocodone-Acetaminophen Rash   Statins Other (See Comments)    Leg cramps and makes patient feel bad. Per pt tried multiple in years past   Sulfonamide Derivatives Rash   BTD:HRCBULAG/TXMIWOEHOZYYQMG except as noted in HPI      Objective:     BP 122/64    Pulse 82    Temp (!) 97.4 F (36.3 C) (Oral)    Ht 5' 5.25" (1.657 m)    Wt 202 lb 4 oz (91.7 kg)    SpO2 97%    BMI 33.40 kg/m  Wt Readings from Last 3 Encounters:  04/15/21 202 lb 4 oz (91.7 kg)  04/09/21 195 lb (88.5 kg)  04/04/21 204 lb (92.5 kg)        Gen: WDWN NAD.  WM.  Nontoxic but tired HEENT: NCAT, conjunctiva not injected, sclera nonicteric NECK:  supple, no thyromegaly, no nodes, no carotid bruits CARDIAC: RRR, S1S2+, no murmur. DP 2+B LUNGS: CTAB. No wheezes EXT:  no edema MSK: no gross abnormalities.  NEURO: A&O x3.  CN II-XII intact.  PSYCH: normal mood. Good eye contact  Assessment & Plan:   Problem List Items Addressed This Visit    None Visit Diagnoses     COVID-19    -  Primary   Acute cough          Covid-residual.  CXR neg.  Increase flovent to 2 puffs bid for 1 wk, then back to 1 puff bid.  Rest, increase fluids.  Worse, let us know.    No orders of the defined types were placed in this encounter.   Wellington Hampshire, MD

## 2021-04-17 DIAGNOSIS — J3089 Other allergic rhinitis: Secondary | ICD-10-CM | POA: Diagnosis not present

## 2021-04-17 DIAGNOSIS — J301 Allergic rhinitis due to pollen: Secondary | ICD-10-CM | POA: Diagnosis not present

## 2021-04-17 DIAGNOSIS — Z8616 Personal history of COVID-19: Secondary | ICD-10-CM | POA: Diagnosis not present

## 2021-04-17 DIAGNOSIS — J3081 Allergic rhinitis due to animal (cat) (dog) hair and dander: Secondary | ICD-10-CM | POA: Diagnosis not present

## 2021-04-17 DIAGNOSIS — Z09 Encounter for follow-up examination after completed treatment for conditions other than malignant neoplasm: Secondary | ICD-10-CM | POA: Diagnosis not present

## 2021-04-21 ENCOUNTER — Other Ambulatory Visit: Payer: Self-pay | Admitting: Family Medicine

## 2021-04-21 ENCOUNTER — Other Ambulatory Visit: Payer: Self-pay

## 2021-04-21 ENCOUNTER — Ambulatory Visit
Admission: RE | Admit: 2021-04-21 | Discharge: 2021-04-21 | Disposition: A | Discharge status: Home / Self Care | Payer: PPO | Source: Ambulatory Visit | Attending: Family Medicine | Admitting: Family Medicine

## 2021-04-21 DIAGNOSIS — N281 Cyst of kidney, acquired: Secondary | ICD-10-CM | POA: Diagnosis not present

## 2021-04-21 DIAGNOSIS — I714 Abdominal aortic aneurysm, without rupture, unspecified: Secondary | ICD-10-CM

## 2021-04-21 DIAGNOSIS — K7689 Other specified diseases of liver: Secondary | ICD-10-CM | POA: Diagnosis not present

## 2021-04-21 DIAGNOSIS — Z9049 Acquired absence of other specified parts of digestive tract: Secondary | ICD-10-CM | POA: Diagnosis not present

## 2021-04-21 NOTE — Chronic Care Management (AMB) (Signed)
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  He is gathering income docs and will bring in for signature once complete.  Beverly Milch, PharmD Clinical Pharmacist  [facility] [phone]

## 2021-04-22 DIAGNOSIS — H61001 Unspecified perichondritis of right external ear: Secondary | ICD-10-CM | POA: Diagnosis not present

## 2021-04-23 ENCOUNTER — Ambulatory Visit (HOSPITAL_BASED_OUTPATIENT_CLINIC_OR_DEPARTMENT_OTHER): Payer: PPO | Attending: Family Medicine | Admitting: Physical Therapy

## 2021-04-23 ENCOUNTER — Encounter (HOSPITAL_BASED_OUTPATIENT_CLINIC_OR_DEPARTMENT_OTHER): Payer: Self-pay | Admitting: Physical Therapy

## 2021-04-23 ENCOUNTER — Other Ambulatory Visit: Payer: Self-pay

## 2021-04-23 DIAGNOSIS — M1712 Unilateral primary osteoarthritis, left knee: Secondary | ICD-10-CM | POA: Diagnosis not present

## 2021-04-23 DIAGNOSIS — M6281 Muscle weakness (generalized): Secondary | ICD-10-CM | POA: Diagnosis not present

## 2021-04-23 DIAGNOSIS — M545 Low back pain, unspecified: Secondary | ICD-10-CM | POA: Insufficient documentation

## 2021-04-23 DIAGNOSIS — G8929 Other chronic pain: Secondary | ICD-10-CM

## 2021-04-23 DIAGNOSIS — M25562 Pain in left knee: Secondary | ICD-10-CM | POA: Diagnosis not present

## 2021-04-23 NOTE — Therapy (Signed)
OUTPATIENT PHYSICAL THERAPY THORACOLUMBAR EVALUATION   Patient Name: Joshua [name] MRN: [id number] DOB:21-Jan-1941, 81 y.o., male Today's Date: 04/24/2021   PT End of Session - 04/23/21 1158     Visit Number 1    Number of Visits 12    Date for PT Re-Evaluation 06/04/21    Progress Note Due on Visit --   05/21/2021   PT Start Time 1105    PT Stop Time 1150    PT Time Calculation (min) 45 min    Activity Tolerance Patient tolerated treatment well    Behavior During Therapy [facility] for tasks assessed/performed             Past Medical History:  Diagnosis Date   Acute medial meniscal tear    Anemia 2015   Arthritis    "middle finger right hand; right knee; neck" (02/06/2014)   Asthma    seasonal   Bladder cancer ([place]) 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   "cauterized during prostate OR"   CAD (coronary artery disease)    CABG 2001   Diverticulosis    DJD (degenerative joint disease)    BACK   GERD (gastroesophageal reflux disease)    H/O inguinal hernia repair 12/2018   History of gout    Hyperlipidemia    Hypertension    IBS (irritable bowel syndrome)    Obesity    OSA (obstructive sleep apnea)    USES CPAP    Pancreatitis ~ 1980   Peripheral vascular disease ([place])    Pre-diabetes    Prostate cancer ([place]) 04/2010   Past Surgical History:  Procedure Laterality Date   ANTERIOR CERVICAL DECOMP/DISCECTOMY FUSION  05/26/2006   PLATE PLACED   BACK SURGERY     CARDIAC CATHETERIZATION  11/20/1999   CORONARY ARTERY BYPASS GRAFT  11/20/1999   SVG-RI1-RI2, SVG-OM, SVG-dRCA   CORONARY STENT INTERVENTION N/A 04/13/2019   Procedure: CORONARY STENT INTERVENTION;  Surgeon: Burnell Blanks, MD;  Location: [facility];  Service: Cardiovascular;  Laterality: N/A;   HERNIA REPAIR     INGUINAL HERNIA REPAIR Left 01/02/2019   Procedure: OPEN LEFT INGUINAL HERNIA REPAIR WITH MESH;  Surgeon: Johnathan Hausen, MD;  Location: [facility];  Service: General;  Laterality: Left;   KNEE ARTHROSCOPY Right  1992; 04/2008   LAPAROSCOPIC CHOLECYSTECTOMY  2007   LEFT HEART CATH AND CORS/GRAFTS ANGIOGRAPHY N/A 04/13/2019   Procedure: LEFT HEART CATH AND CORS/GRAFTS ANGIOGRAPHY;  Surgeon: Burnell Blanks, MD;  Location: [facility];  Service: Cardiovascular;  Laterality: N/A;   NASAL SEPTUM SURGERY Right 2007   ROBOT ASSISTED LAPAROSCOPIC RADICAL PROSTATECTOMY  04/2010   THYROIDECTOMY, PARTIAL  1980   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   Procedure: LEFT TOTAL KNEE ARTHROPLASTY;  Surgeon: Melrose Nakayama, MD;  Location: [facility];  Service: Orthopedics;  Laterality: Left;   Patient Active Problem List   Diagnosis Date Noted   Greater trochanteric bursitis of right hip 02/17/2021   Hyperactive bowel sounds 01/29/2021   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 ([place]) 09/25/2020   Primary osteoarthritis of left knee 06/18/2020   Pre-operative respiratory examination 06/05/2020   Coronary artery disease involving coronary bypass graft of native heart without angina pectoris 04/07/2020   Trigger thumb, left thumb 01/24/2020   Overactive bladder 11/24/2019   Left lateral epicondylitis 09/05/2019   Degenerative arthritis of left knee 08/08/2019   Educated about COVID-19 virus infection 04/20/2019   Unstable angina ([facility])    Greater trochanteric bursitis of left  hip 10/27/2018   Degenerative disc disease, lumbar 10/27/2018   Abdominal muscle strain, initial encounter 06/16/2018   Aortic atherosclerosis ([place]) 05/05/2018   Hyperglycemia 10/07/2017   Weakness of left leg 06/21/2017   DJD (degenerative joint disease)    Arthritis    Neck pain 02/08/2017   Venous insufficiency 12/29/2016   Ganglion cyst of tendon sheath of right hand 12/29/2016   Avulsion fracture of metatarsal bone of right foot 11/25/2016   Gout 11/20/2016   Obesity, Class I, BMI 30-34.9 10/03/2015   Hereditary and idiopathic peripheral neuropathy 09/25/2014   Allergic rhinitis 05/31/2014    Fatigue 11/09/2013   CKD (chronic kidney disease), stage III ([place]) 10/23/2013   Dizziness 08/23/2013   Neuropathy ([place]) 08/21/2013   RLS (restless legs syndrome) 01/03/2013   Chronic bilateral low back pain with left-sided sciatica 10/09/2011   History of bladder cancer 04/01/2010   OSTEOARTHRITIS, LOWER LEG, LEFT 10/04/2009   Generalized abdominal pain 01/31/2009   ANEMIA, IRON DEFICIENCY 11/06/2008   TINNITUS, CHRONIC, 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   Essential hypertension 02/21/2007   OSA (obstructive sleep apnea) 11/02/2006   Hyperlipidemia 10/12/2006   Depression, major, single episode, in partial remission ([place]) 10/12/2006   Coronary atherosclerosis 10/12/2006   Asthma 10/12/2006   GERD 10/12/2006   Irritable bowel syndrome 10/12/2006    PCP: [name], MD  REFERRING PROVIDER: Lyndal Pulley, DO  REFERRING DIAG: M54.50 (ICD-10-CM) - Lumbar spine pain       M17.12 (ICD-10-CM) - Primary osteoarthritis of left knee   THERAPY DIAG:  Chronic low back pain, unspecified back pain laterality, unspecified whether sciatica present  Left knee pain, unspecified chronicity  Muscle weakness (generalized)  ONSET DATE: Chronic pain / MD note 03/28/2021  SUBJECTIVE:                                                                                                                                                                                           SUBJECTIVE STATEMENT: Pt reports his back pain began approx 10 years ago after doing some push ups in boot camp class.  Pt has been told he has arthritis in back.  Pt has had multiple injections and reports the epidural injection helped one time.  Pt has received dry needling which didn't help.  Pt states his lumbar pain comes and goes but increases with activity and exercises.  Pt has increased lumbar pain today due to walking and vacuuming.  Pt has increased pain with standing > 10 mins.  He can have pain sitting in a bad chair.  Pt is  limited with ambulation.  Pt states he used to do a  lot of exercises though has not been for awhile due to lumbar pain.  Pt states he loses balance quite often with ambulation or turning but doesn't fall.     Pt had a L TKA on 06/18/2020 and reports having a lot of pain in L knee for 9 months.  Pt states his knee began feeling better in December.  He has less pain in knee though continues to have increased pain with increased activity.    MD note indicated pt has known degenerative disc disease.  MD ordered PT and note states aquatic therapy would be beneficial.    PERTINENT HISTORY:  -Chronic Lumbar pain.  Spondylolisthesis at L4-L5 level and possible minimal anterolisthesis at L5-S1. Pt states he has an AAA though [place] stated the mild prominence does not meet the criteria for aneurysm; ectasia of infrarenal aorta.  Chronic kidney disease. Hx of prostate and bladder CA.  Pt recently had Covid which was dx'd on 1/13.          -PSHx:  L inguinal hernia repair in 2020, L TKA on 06/18/2020 ; CAD with CABG x5 on 11/20/1999 and Coronary stent placement on 04/13/2019 ;  Anterior cervical decomp/discectomy/fusion on 05/26/2006    PAIN:  Are you having pain? Yes NPRS scale: Lumbar:  5-6/10 current, 7-8/10 worst, 0/10 best       L Knee:  1/10 current, 1-2/10, 0/10 best Pain location: central lumbar and on both sides of lumbar spine Aggravating factors: standing, ambulation, sitting in a bad chair Relieving factors  PRECAUTIONS: Other: Lumbar spondylolisthesis, Hx of CABG and cervical fusion; aorta ectasia  WEIGHT BEARING RESTRICTIONS No  FALLS:  Has patient fallen in last 6 months? Pt may have fallen once trying to don pants, otherwise none.    OCCUPATION: Pt is retired  PLOF: Independent  PATIENT GOALS reduced pain, improved tolerance to activity, wants to be able to exercise   OBJECTIVE:   DIAGNOSTIC FINDINGS:  -Lumbar X ray:  Lumbar spondylosis with interval progression. There is  first-degree spondylolisthesis at L4-L5 level. There is possible minimal anterolisthesis at L5-S1 level. ectasia of infrarenal aorta.  -X ray of knee in November:   IMPRESSION:  Uncomplicated left total knee prosthesis.  -Abdominal US:   1. 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 While the distal abdominal aorta is mildly prominent measuring 2.7 cm, by CT criteria, comparing to the more proximal normal aorta, the mild prominence does not meet the criteria for aneurysm. No follow-up imaging required.  PATIENT SURVEYS:  FOTO 68 with a goal of 79  SCREENING FOR RED FLAGS: Spinal tumors: No Cauda equina syndrome: No Compression fracture: No Abdominal aneurysm: Pt states he has an AAA though abdominal US states does not meet the criteria for AAA.  Pt has an ectasia of infrarenal aorta.  COGNITION:  Overall cognitive status: Within functional limits for tasks assessed       POSTURE:  Increased forward head posture   LUMBARAROM/PROM  A/PROM A/PROM  04/24/2021  Flexion 90%  Extension   Right lateral flexion WFL  Left lateral flexion WFL with pain  Right rotation [facility] with pain  Left rotation WFL   (Blank rows = not tested)  LE AROM/PROM:  A/PROM Right 04/24/2021 Left 04/24/2021  Hip flexion 4+/5 4/5  Hip extension    Hip abduction 4+/5 4/5  Hip adduction    Hip internal rotation    Hip external rotation 4/5 4-/5  Knee flexion 5/5 4+/5  Knee extension 5/5 5/5  Ankle dorsiflexion  Ankle plantarflexion    Ankle inversion    Ankle eversion     (Blank rows = not tested)  LE MMT:  MMT Right 04/24/2021 Left 04/24/2021  Hip flexion    Hip extension    Hip abduction    Hip adduction    Hip internal rotation    Hip external rotation    Knee flexion 115 110  Knee extension 0 2/0  Ankle dorsiflexion    Ankle plantarflexion    Ankle inversion    Ankle eversion     (Blank rows = not tested)   FUNCTIONAL MOBILITY:  Pt lost his balance twice when standing up from chair.   He did not control his descent when lowering himself to his chair.  GAIT: Assistive device utilized: None Level of assistance: Complete Independence Comments: Pt had no limp with ambulating in the clinic.     TODAY'S TREATMENT  See below for pt education   PATIENT EDUCATION:  Education details: POC, dx, objective findings, rationale of exercises, and aquatic therapy process.  PT answered Pt's questions.  Educated pt on the benefits and purpose of aquatic therapy including buoyancy and viscosity.  Person educated: Patient Education method: Explanation Education comprehension: verbalized understanding   HOME EXERCISE PROGRAM: Will give next visit.    ASSESSMENT:  CLINICAL IMPRESSION: Patient is a 81 y.o. male with dx of lumbar spine pain and L knee OA presenting to the clinic with chronic LBP, L knee pain, and muscle weakness in bilat LE's L > R.   Pt is 11 months s/p L TKA.  Pt has increased lumbar pain with ambulation, household chores, and standing > 10 mins.  Pt is limited with ambulation and states he loses balance quite often with ambulation or turning but doesn't fall.  Pt was having much pain in L knee though states his knee began feeling better in December.  He has less pain in knee though continues to have increased pain with increased activity.  Pt should benefit from skilled PT services to address above impairments and improve overall function.   Objective impairments include decreased activity tolerance, decreased balance, decreased endurance, decreased mobility, difficulty walking, decreased ROM, decreased strength, hypomobility, impaired flexibility, and pain. These impairments are limiting patient from cleaning, community activity, shopping, and ambulation and standing activities . Personal factors including Time since onset of injury/illness/exacerbation and 1-2 comorbidities: CAD with CABG x5 on 11/20/1999 and Coronary stent placement on 04/13/2019. Chronic kidney disease   are also affecting patient's functional outcome.   REHAB POTENTIAL: Good  CLINICAL DECISION MAKING: Evolving/moderate complexity  EVALUATION COMPLEXITY: Moderate   GOALS:   SHORT TERM GOALS:  STG Name Target Date Goal status  1 Pt will be independent and compliant with HEP for improved pain, strength, and function.  Baseline:  05/14/2021 INITIAL  2 Pt will tolerate aquatic therapy without adverse effects for improved strength and tolerance to activity.  Baseline:  05/07/2021 INITIAL  3 Pt will report at least a 25% improvement in pain and sx's overall for improved daily mobility.  Baseline: 05/14/2021 INITIAL                       LONG TERM GOALS:   LTG Name Target Date Goal status  1 Pt will demo improved strength to 5/5 MMT in bilat hips and L knee flexion for improved tolerance to activity and functional mobility.  Baseline: 06/04/2021 INITIAL  2 Pt will be able to stand > 20 mins without  significant pain. Baseline: 06/04/2021 INITIAL  3 Pt will be able to perform extended community ambulation without significant lumbar and knee pain. Baseline: 06/04/2021 INITIAL  4 Pt will be independent with aquatic HEP in order to establish a long term HEP for improved tolerance with and performance of functional mobility including reduced pain with daily act's.  Baseline: 06/04/2021 INITIAL  5 Pt will report at least a 70% reduction in pain and increase in ease with performing daily act's and daily fxnl mobility.  Baseline: 06/04/2021 INITIAL             PLAN: PT FREQUENCY: 2x/week  PT DURATION: 6 weeks  PLANNED INTERVENTIONS: Therapeutic exercises, Therapeutic activity, Neuro Muscular re-education, Balance training, Gait training, Patient/Family education, Joint mobilization, Stair training, Aquatic Therapy, Dry Needling, Spinal mobilization, Cryotherapy, Moist heat, Taping, and Manual therapy  PLAN FOR NEXT SESSION: Land visit next visit and establish HEP.  Aquatic therapy to  follow.    Selinda Michaels III PT, DPT 04/24/21 5:10 PM

## 2021-04-25 ENCOUNTER — Encounter (HOSPITAL_BASED_OUTPATIENT_CLINIC_OR_DEPARTMENT_OTHER): Payer: Self-pay | Admitting: Physical Therapy

## 2021-04-28 NOTE — Therapy (Signed)
OUTPATIENT PHYSICAL THERAPY TREATMENT NOTE   Patient Name: Joshua [name] MRN: [id number] DOB:February 06, 1941, 81 y.o., male Today's Date: 04/29/2021  PCP: [name], MD REFERRING PROVIDER: Lyndal Pulley, DO   PT End of Session - 04/29/21 [phone]     Visit Number 2    Number of Visits 12    Date for PT Re-Evaluation 06/04/21    Progress Note Due on Visit --   05/21/2021   PT Start Time 0804    PT Stop Time 2585    PT Time Calculation (min) 40 min    Activity Tolerance Patient tolerated treatment well    Behavior During Therapy [facility] for tasks assessed/performed             Past Medical History:  Diagnosis Date   Acute medial meniscal tear    Anemia 2015   Arthritis    "middle finger right hand; right knee; neck" (02/06/2014)   Asthma    seasonal   Bladder cancer ([place]) 04/2010   "cauterized during prostate OR"   CAD (coronary artery disease)    CABG 2001   Diverticulosis    DJD (degenerative joint disease)    BACK   GERD (gastroesophageal reflux disease)    H/O inguinal hernia repair 12/2018   History of gout    Hyperlipidemia    Hypertension    IBS (irritable bowel syndrome)    Obesity    OSA (obstructive sleep apnea)    USES CPAP    Pancreatitis ~ 1980   Peripheral vascular disease ([place])    Pre-diabetes    Prostate cancer ([place]) 04/2010   Past Surgical History:  Procedure Laterality Date   ANTERIOR CERVICAL DECOMP/DISCECTOMY FUSION  05/26/2006   PLATE PLACED   BACK SURGERY     CARDIAC CATHETERIZATION  11/20/1999   CORONARY ARTERY BYPASS GRAFT  11/20/1999   SVG-RI1-RI2, SVG-OM, SVG-dRCA   CORONARY STENT INTERVENTION N/A 04/13/2019   Procedure: CORONARY STENT INTERVENTION;  Surgeon: Burnell Blanks, MD;  Location: [place] [facility];  Service: Cardiovascular;  Laterality: N/A;   HERNIA REPAIR     INGUINAL HERNIA REPAIR Left 01/02/2019   Procedure: OPEN LEFT INGUINAL HERNIA REPAIR WITH MESH;  Surgeon: Johnathan Hausen, MD;  Location: [facility];   Service: General;  Laterality: Left;   KNEE ARTHROSCOPY Right 1992; 04/2008   LAPAROSCOPIC CHOLECYSTECTOMY  2007   LEFT HEART CATH AND CORS/GRAFTS ANGIOGRAPHY N/A 04/13/2019   Procedure: LEFT HEART CATH AND CORS/GRAFTS ANGIOGRAPHY;  Surgeon: Burnell Blanks, MD;  Location: [place] [facility];  Service: Cardiovascular;  Laterality: N/A;   NASAL SEPTUM SURGERY Right 2007   ROBOT ASSISTED LAPAROSCOPIC RADICAL PROSTATECTOMY  04/2010   THYROIDECTOMY, PARTIAL  1980   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   Procedure: LEFT TOTAL KNEE ARTHROPLASTY;  Surgeon: Melrose Nakayama, MD;  Location: [facility];  Service: Orthopedics;  Laterality: Left;   Patient Active Problem List   Diagnosis Date Noted   Greater trochanteric bursitis of right hip 02/17/2021   Hyperactive bowel sounds 01/29/2021   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 ([place]) 09/25/2020   Primary osteoarthritis of left knee 06/18/2020   Pre-operative respiratory examination 06/05/2020   Coronary artery disease involving coronary bypass graft of native heart without angina pectoris 04/07/2020   Trigger thumb, left thumb 01/24/2020   Overactive bladder 11/24/2019   Left lateral epicondylitis 09/05/2019   Degenerative arthritis of left knee 08/08/2019   Educated about COVID-19 virus infection 04/20/2019  Unstable angina ([facility])    Greater trochanteric bursitis of left hip 10/27/2018   Degenerative disc disease, lumbar 10/27/2018   Abdominal muscle strain, initial encounter 06/16/2018   Aortic atherosclerosis ([place]) 05/05/2018   Hyperglycemia 10/07/2017   Weakness of left leg 06/21/2017   DJD (degenerative joint disease)    Arthritis    Neck pain 02/08/2017   Venous insufficiency 12/29/2016   Ganglion cyst of tendon sheath of right hand 12/29/2016   Avulsion fracture of metatarsal bone of right foot 11/25/2016   Gout 11/20/2016   Obesity, Class I, BMI 30-34.9 10/03/2015   Hereditary and idiopathic peripheral  neuropathy 09/25/2014   Allergic rhinitis 05/31/2014   Fatigue 11/09/2013   CKD (chronic kidney disease), stage III ([place]) 10/23/2013   Dizziness 08/23/2013   Neuropathy ([place]) 08/21/2013   RLS (restless legs syndrome) 01/03/2013   Chronic bilateral low back pain with left-sided sciatica 10/09/2011   History of bladder cancer 04/01/2010   OSTEOARTHRITIS, LOWER LEG, LEFT 10/04/2009   Generalized abdominal pain 01/31/2009   ANEMIA, IRON DEFICIENCY 11/06/2008   TINNITUS, CHRONIC, 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   Essential hypertension 02/21/2007   OSA (obstructive sleep apnea) 11/02/2006   Hyperlipidemia 10/12/2006   Depression, major, single episode, in partial remission ([place]) 10/12/2006   Coronary atherosclerosis 10/12/2006   Asthma 10/12/2006   GERD 10/12/2006   Irritable bowel syndrome 10/12/2006  REFERRING PROVIDER: Lyndal Pulley, DO   REFERRING DIAG: M54.50 (ICD-10-CM) - Lumbar spine pain       M17.12 (ICD-10-CM) - Primary osteoarthritis of left knee    THERAPY DIAG:  Chronic low back pain, unspecified back pain laterality, unspecified whether sciatica present   Left knee pain, unspecified chronicity   Muscle weakness (generalized)   ONSET DATE: Chronic pain / MD note 03/28/2021   SUBJECTIVE:                                                                                                                                                                                            SUBJECTIVE STATEMENT: MD note indicated pt has known degenerative disc disease.  MD ordered PT and note states aquatic therapy would be beneficial.  Pt states he had increased in lumbar and knee later after prior Rx.  Pt reports he vacuumed a couple of days in a row and that may have increased pain also.  Pt states he has increased pain if he stands for any length of time.  Pt reports increased pain with standing > 5 mins.  Pt states he is ok sitting with a back on a chair.        PERTINENT HISTORY:   -Chronic  Lumbar pain.  Spondylolisthesis at L4-L5 level and possible minimal anterolisthesis at L5-S1. Pt states he has an AAA though [place] stated the mild prominence does not meet the criteria for aneurysm; ectasia of infrarenal aorta.  Chronic kidney disease. Hx of prostate and bladder CA.  Pt recently had Covid which was dx'd on 1/13.           -PSHx:  L inguinal hernia repair in 2020, L TKA on 06/18/2020 ; CAD with CABG x5 on 11/20/1999 and Coronary stent placement on 04/13/2019 ;  Anterior cervical decomp/discectomy/fusion on 05/26/2006      PAIN:  Are you having pain? Yes NPRS scale: Lumbar:  5/10 current, 7-8/10 worst, 0/10 best                         L Knee:  2/10 current, 1-2/10, 0/10 best Pain location: both sides of lumbar spine > central lumbar Aggravating factors: standing, ambulation, sitting in a bad chair Relieving factors   PRECAUTIONS: Other: Lumbar spondylolisthesis, Hx of CABG and cervical fusion; aorta ectasia     OCCUPATION: Pt is retired   PLOF: Independent   PATIENT GOALS reduced pain, improved tolerance to activity, wants to be able to exercise     OBJECTIVE:    DIAGNOSTIC FINDINGS:  -Lumbar X ray:  Lumbar spondylosis with interval progression. There is first-degree spondylolisthesis at L4-L5 level. There is possible minimal anterolisthesis at L5-S1 level. ectasia of infrarenal aorta.   -X ray of knee in November:   IMPRESSION:  Uncomplicated left total knee prosthesis.   -Abdominal US:   1. 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 While the distal abdominal aorta is mildly prominent measuring 2.7 cm, by CT criteria, comparing to the more proximal normal aorta, the mild prominence does not meet the criteria for aneurysm. No follow-up imaging required.                           TODAY'S TREATMENT  -Reviewed response to prior, current function, and pain level. -Pt performed:  Supine PPT 2x10 reps  Supine TrA contraction x 10 reps, no hold  Supine marching  with PPT 2x10 reps  Supine heel slides with PPT 1x10 reps and 1x8 reps  Supine clams with PPT with RTB 2x10 reps  Seated HS stretch 2x20 sec bilat  Supine manual HS stretch 2x20-30 sec bilat -Assessed HS and piriformis flexibility:  -Pt has tightness in bilat HS L > R.  Pt didn't feel a stretch in R piriformis and felt a minimal stretch on L   -Pt received a HEP handout and was educated in correct form and appropriate frequency.       PATIENT EDUCATION:  Education details: POC, dx, objective findings, rationale of exercises, and aquatic therapy process.  PT answered Pt's questions.  Educated pt on the benefits and purpose of aquatic therapy including buoyancy and viscosity.  Person educated: Patient Education method: Explanation Education comprehension: verbalized understanding     HOME EXERCISE PROGRAM: Access Code: [email or web address] URL: [email or web address] Date: 04/29/2021 Prepared by: Ronny Flurry  Exercises Supine Posterior Pelvic Tilt - 2 x daily - 7 x weekly - 2 sets - 10 reps Supine March - 1-2 x daily - 7 x weekly - 1 sets - 10 reps Hooklying Clamshell with Resistance - 1 x daily - 4-5 x weekly - 1-2 sets - 10 reps Supine Transversus Abdominis Bracing - Hands on Stomach - 2 x daily -  7 x weekly - 1-2 sets - 10 reps Seated Hamstring Stretch - 2 x daily - 7 x weekly - 2 reps - 20-30 second hold      ASSESSMENT:   CLINICAL IMPRESSION:  PT had pt perform core exercises and pt did have some pain with initial core exercises today.  He didn't have significantly increased back pain with core exercises though did report increased L knee pain with supine heel slides.  Pt has tightness in bilat HS L > R.  PT gave pt a HEP handout and educated pt.  Pt demonstrates good understanding of HEP; PT did not include heel slide in HEP.  Pt reports increased L knee pain though no increased lumbar pain after Rx.   Pt should benefit from skilled PT services to address above impairments  and improve overall function.    Objective impairments include decreased activity tolerance, decreased balance, decreased endurance, decreased mobility, difficulty walking, decreased ROM, decreased strength, hypomobility, impaired flexibility, and pain. These impairments are limiting patient from cleaning, community activity, shopping, and ambulation and standing activities . Personal factors including Time since onset of injury/illness/exacerbation and 1-2 comorbidities: CAD with CABG x5 on 11/20/1999 and Coronary stent placement on 04/13/2019. Chronic kidney disease  are also affecting patient's functional outcome.     REHAB POTENTIAL: Good   CLINICAL DECISION MAKING: Evolving/moderate complexity   EVALUATION COMPLEXITY: Moderate     GOALS:     SHORT TERM GOALS:   STG Name Target Date Goal status  1 Pt will be independent and compliant with HEP for improved pain, strength, and function.  Baseline:  05/14/2021 INITIAL  2 Pt will tolerate aquatic therapy without adverse effects for improved strength and tolerance to activity.  Baseline:  05/07/2021 INITIAL  3 Pt will report at least a 25% improvement in pain and sx's overall for improved daily mobility.  Baseline: 05/14/2021 INITIAL                                        LONG TERM GOALS:    LTG Name Target Date Goal status  1 Pt will demo improved strength to 5/5 MMT in bilat hips and L knee flexion for improved tolerance to activity and functional mobility.  Baseline: 06/04/2021 INITIAL  2 Pt will be able to stand > 20 mins without significant pain. Baseline: 06/04/2021 INITIAL  3 Pt will be able to perform extended community ambulation without significant lumbar and knee pain. Baseline: 06/04/2021 INITIAL  4 Pt will be independent with aquatic HEP in order to establish a long term HEP for improved tolerance with and performance of functional mobility including reduced pain with daily act's.  Baseline: 06/04/2021 INITIAL  5 Pt will  report at least a 70% reduction in pain and increase in ease with performing daily act's and daily fxnl mobility.  Baseline: 06/04/2021 INITIAL                      PLAN: PT FREQUENCY: 2x/week   PT DURATION: 6 weeks   PLANNED INTERVENTIONS: Therapeutic exercises, Therapeutic activity, Neuro Muscular re-education, Balance training, Gait training, Patient/Family education, Joint mobilization, Stair training, Aquatic Therapy, Dry Needling, Spinal mobilization, Cryotherapy, Moist heat, Taping, and Manual therapy   PLAN FOR NEXT SESSION:  Aquatic therapy next visit.  Next [place] visit:  Add Nustep with bilat UE/LE and check for any tenderness in glute or  lumbar paraspinals and possible STM.     Selinda Michaels III PT, DPT 04/29/21 3:07 PM

## 2021-04-29 ENCOUNTER — Encounter (HOSPITAL_BASED_OUTPATIENT_CLINIC_OR_DEPARTMENT_OTHER): Payer: Self-pay | Admitting: Physical Therapy

## 2021-04-29 ENCOUNTER — Ambulatory Visit (HOSPITAL_BASED_OUTPATIENT_CLINIC_OR_DEPARTMENT_OTHER): Payer: PPO | Admitting: Physical Therapy

## 2021-04-29 ENCOUNTER — Encounter: Payer: PPO | Admitting: Internal Medicine

## 2021-04-29 ENCOUNTER — Other Ambulatory Visit: Payer: Self-pay

## 2021-04-29 DIAGNOSIS — M545 Low back pain, unspecified: Secondary | ICD-10-CM | POA: Diagnosis not present

## 2021-04-29 DIAGNOSIS — G8929 Other chronic pain: Secondary | ICD-10-CM

## 2021-04-29 DIAGNOSIS — J3081 Allergic rhinitis due to animal (cat) (dog) hair and dander: Secondary | ICD-10-CM | POA: Diagnosis not present

## 2021-04-29 DIAGNOSIS — J301 Allergic rhinitis due to pollen: Secondary | ICD-10-CM | POA: Diagnosis not present

## 2021-04-29 DIAGNOSIS — M6281 Muscle weakness (generalized): Secondary | ICD-10-CM

## 2021-04-29 DIAGNOSIS — M25562 Pain in left knee: Secondary | ICD-10-CM

## 2021-04-29 DIAGNOSIS — J3089 Other allergic rhinitis: Secondary | ICD-10-CM | POA: Diagnosis not present

## 2021-05-02 NOTE — Progress Notes (Addendum)
Phone: [phone]   Subjective:  Patient presents today for their annual physical. Chief complaint-noted.   See problem oriented charting- ROS- full  review of systems was completed and negative  except for: ongoing fatigue- perhaps slightly worse, hearing loss about stable- using hearing aids, tinnitus stable, had wound on his arm after falling - was wearing new shoes and tripped with new house shoes- was able to get arm out but had a skin tear- redressed this today   Immunization History  Administered Date(s) Administered   Fluad Quad(high Dose 65+) 11/21/2018, 12/11/2020   Influenza Split 01/08/2012, 11/12/2014   Influenza Whole 12/08/2007, 12/26/2008, 12/22/2010   Influenza, High Dose Seasonal PF 11/16/2015, 01/02/2016, 01/01/2017, 01/06/2018, 02/01/2019, 11/24/2019, 01/23/2020   Influenza,inj,Quad PF,6+ Mos 11/30/2012   Influenza-Unspecified 01/12/2014, 12/21/2017   PFIZER Comirnaty(Gray Top)Covid-19 Tri-Sucrose Vaccine 07/29/2020   PFIZER(Purple Top)SARS-COV-2 Vaccination 04/28/2019, 05/23/2019, 12/21/2019, 01/23/2020   Pfizer Covid-19 Vaccine Bivalent Booster 7yrs & up 12/11/2020   Pneumococcal Conjugate-13 05/31/2014   Pneumococcal Polysaccharide-23 06/04/2006, 07/15/2012, 01/02/2016, 12/31/2016, 01/23/2020   Td 04/23/2005, 05/13/2021   Tdap 02/18/2016   Zoster Recombinat (Shingrix) 01/31/2018, 04/02/2018   Zoster, Live 01/08/2012     The following were reviewed and entered/updated in epic: Past Medical History:  Diagnosis Date   Acute medial meniscal tear    Anemia 2015   Arthritis    "middle finger right hand; right knee; neck" (02/06/2014)   Asthma    seasonal   Bladder cancer ([place]) 04/2010   "cauterized during prostate OR"   CAD (coronary artery disease)    CABG 2001   Diverticulosis    DJD (degenerative joint disease)    BACK   GERD (gastroesophageal reflux disease)    H/O inguinal hernia repair 12/2018   History of gout    Hyperlipidemia     Hypertension    IBS (irritable bowel syndrome)    Obesity    OSA (obstructive sleep apnea)    USES CPAP    Pancreatitis ~ 1980   Peripheral vascular disease ([place])    Pre-diabetes    Prostate cancer ([place]) 04/2010   Patient Active Problem List   Diagnosis Date Noted   Coronary atherosclerosis 10/12/2006    Priority: High   Irritable bowel syndrome 10/12/2006    Priority: High   Overactive bladder 11/24/2019    Priority: Medium    Hyperglycemia 10/07/2017    Priority: Medium    Gout 11/20/2016    Priority: Medium    CKD (chronic kidney disease), stage III ([place]) 10/23/2013    Priority: Medium    Neuropathy ([place]) 08/21/2013    Priority: Medium    History of bladder cancer 04/01/2010    Priority: Medium    Essential hypertension 02/21/2007    Priority: Medium    Hyperlipidemia 10/12/2006    Priority: Medium    Major depression in full remission ([place]) 10/12/2006    Priority: Medium    Asthma 10/12/2006    Priority: Medium    Aortic atherosclerosis ([place]) 05/05/2018    Priority: Low   DJD (degenerative joint disease)     Priority: Low   Arthritis     Priority: Low   Neck pain 02/08/2017    Priority: Low   Venous insufficiency 12/29/2016    Priority: Low   Ganglion cyst of tendon sheath of right hand 12/29/2016    Priority: Low   Avulsion fracture of metatarsal bone of right foot 11/25/2016    Priority: Low   Obesity, Class I, BMI 30-34.9 10/03/2015  Priority: Low   Allergic rhinitis 05/31/2014    Priority: Low   Fatigue 11/09/2013    Priority: Low   Dizziness 08/23/2013    Priority: Low   RLS (restless legs syndrome) 01/03/2013    Priority: Low   Chronic bilateral low back pain with left-sided sciatica 10/09/2011    Priority: Low   OSTEOARTHRITIS, LOWER LEG, LEFT 10/04/2009    Priority: Low   ANEMIA, IRON DEFICIENCY 11/06/2008    Priority: Low   TINNITUS, CHRONIC, 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    Priority: Low   OSA (obstructive sleep apnea) 11/02/2006    Priority:  Low   GERD 10/12/2006    Priority: Low   Greater trochanteric bursitis of right hip 02/17/2021   Hyperactive bowel sounds 01/29/2021   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 ([place]) 09/25/2020   Primary osteoarthritis of left knee 06/18/2020   Pre-operative respiratory examination 06/05/2020   Coronary artery disease involving coronary bypass graft of native heart without angina pectoris 04/07/2020   Trigger thumb, left thumb 01/24/2020   Left lateral epicondylitis 09/05/2019   Degenerative arthritis of left knee 08/08/2019   Educated about COVID-19 virus infection 04/20/2019   Unstable angina (HCC)    Greater trochanteric bursitis of left hip 10/27/2018   Degenerative disc disease, lumbar 10/27/2018   Abdominal muscle strain, initial encounter 06/16/2018   Weakness of left leg 06/21/2017   Hereditary and idiopathic peripheral neuropathy 09/25/2014   Generalized abdominal pain 01/31/2009   Past Surgical History:  Procedure Laterality Date   ANTERIOR CERVICAL DECOMP/DISCECTOMY FUSION  05/26/2006   PLATE PLACED   BACK SURGERY     CARDIAC CATHETERIZATION  11/20/1999   CORONARY ARTERY BYPASS GRAFT  11/20/1999   SVG-RI1-RI2, SVG-OM, SVG-dRCA   CORONARY STENT INTERVENTION N/A 04/13/2019   Procedure: CORONARY STENT INTERVENTION;  Surgeon: Burnell Blanks, MD;  Location: [facility];  Service: Cardiovascular;  Laterality: N/A;   HERNIA REPAIR     INGUINAL HERNIA REPAIR Left 01/02/2019   Procedure: OPEN LEFT INGUINAL HERNIA REPAIR WITH MESH;  Surgeon: Johnathan Hausen, MD;  Location: [facility];  Service: General;  Laterality: Left;   KNEE ARTHROSCOPY Right 1992; 04/2008   LAPAROSCOPIC CHOLECYSTECTOMY  2007   LEFT HEART CATH AND CORS/GRAFTS ANGIOGRAPHY N/A 04/13/2019   Procedure: LEFT HEART CATH AND CORS/GRAFTS ANGIOGRAPHY;  Surgeon: Burnell Blanks, MD;  Location: [facility];  Service: Cardiovascular;  Laterality: N/A;   NASAL SEPTUM SURGERY  Right 2007   ROBOT ASSISTED LAPAROSCOPIC RADICAL PROSTATECTOMY  04/2010   THYROIDECTOMY, PARTIAL  1980   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   Procedure: LEFT TOTAL KNEE ARTHROPLASTY;  Surgeon: Melrose Nakayama, MD;  Location: [facility];  Service: Orthopedics;  Laterality: Left;    Family History  Problem Relation Age of Onset   Heart disease Mother    Hyperlipidemia Mother    Heart disease Father    Hyperlipidemia Father    Diabetes Brother    Colon cancer Neg Hx    Pancreatic cancer Neg Hx    Esophageal cancer Neg Hx    Stomach cancer Neg Hx    Liver cancer Neg Hx     Medications- reviewed and updated Current Outpatient Medications  Medication Sig Dispense Refill   acetaminophen (TYLENOL) 500 MG tablet Take 1,000 mg by mouth every 6 (six) hours as needed for moderate pain or headache.     albuterol (PROAIR HFA) 108 (90 BASE) MCG/ACT inhaler Inhale 1-2 puffs into the lungs  every 6 (six) hours as needed for wheezing or shortness of breath. 18 g 3   Alirocumab (PRALUENT) 150 MG/ML SOAJ INJECT 1 DOSE INTO THE SKIN EVERY 14 (FOURTEEN) DAYS. 2 mL 11   amLODipine (NORVASC) 5 MG tablet Take 1 tablet (5 mg total) by mouth daily. 90 tablet 3   aspirin EC 81 MG tablet Take 1 tablet (81 mg total) by mouth 2 (two) times daily after a meal. 30 tablet 11   busPIRone (BUSPAR) 5 MG tablet Take 1 tablet (5 mg total) by mouth 2 (two) times daily as needed (anxiety). 40 tablet 2   cholecalciferol (VITAMIN D3) 25 MCG (1000 UNIT) tablet Take 1,000 Units by mouth daily.     clotrimazole-betamethasone (LOTRISONE) cream Apply 1 application topically See admin instructions. Twice daily every 4 days     Cyanocobalamin (B-12) 2500 MCG TABS Take 1,250 mcg by mouth daily.     diazepam (VALIUM) 5 MG tablet Take 1 tablet (5 mg total) by mouth at bedtime as needed (sleep). 30 tablet 1   EPINEPHrine 0.3 mg/0.3 mL IJ SOAJ injection Inject 0.3 mg into the muscle as needed for anaphylaxis. for allergic reaction      ezetimibe (ZETIA) 10 MG tablet Take 10 mg by mouth daily.     FLOVENT HFA 110 MCG/ACT inhaler Inhale 1 puff into the lungs 2 (two) times daily.   5   fluticasone (FLONASE) 50 MCG/ACT nasal spray Place 1 spray into both nostrils 2 (two) times daily.      folic acid (FOLVITE) 101 MCG tablet Take 800 mcg by mouth daily.      gabapentin (NEURONTIN) 300 MG capsule Take 1 cap in AM, 1 cap at noon, 2 caps at bedtime (Patient taking differently: Take 300-600 mg by mouth See admin instructions. Take 300 mg in the morning, 300 mg at noon, and 600 mg at bedtime) 360 capsule 3   GEMTESA 75 MG TABS TAKE 1 TABLET BY MOUTH DAILY. 30 tablet 11   Glucos-Chond-Hyal Ac-Ca Fructo (MOVE FREE JOINT HEALTH ADVANCE PO) Take 1 tablet by mouth daily.     hydrochlorothiazide (HYDRODIURIL) 25 MG tablet TAKE 1 TABLET BY MOUTH EVERY DAY 90 tablet 2   ketoconazole (NIZORAL) 2 % cream Apply 1 application topically See admin instructions. Twice a day every other day     loratadine (CLARITIN) 10 MG tablet Take 10 mg by mouth at bedtime.      Miconazole Nitrate (LOTRIMIN AF DEODORANT POWDER) 2 % AERP Apply 1 spray topically daily as needed (jock itch).     nitroGLYCERIN (NITROSTAT) 0.4 MG SL tablet PLACE 1 TABLET UNDER THE TONGUE EVERY 5 (FIVE) MINUTES AS NEEDED. 75 tablet 1   nystatin-triamcinolone ointment (MYCOLOG) Apply 1 application topically See admin instructions. Twice daily every 4 days     pantoprazole (PROTONIX) 40 MG tablet TAKE 1 TABLET BY MOUTH EVERY DAY 90 tablet 3   polyethylene glycol powder (GLYCOLAX/MIRALAX) powder Take 17 g by mouth daily. 255 g 0   tiZANidine (ZANAFLEX) 4 MG tablet Take 1 tablet (4 mg total) by mouth every 6 (six) hours as needed for muscle spasms. 40 tablet 1   valsartan (DIOVAN) 320 MG tablet Take 1 tablet (320 mg total) by mouth daily. 90 tablet 3   Wheat Dextrin (BENEFIBER) POWD Take 1 Dose by mouth daily. 1 dose = 2 teaspoons     No current facility-administered medications for this visit.     Allergies-reviewed and updated Allergies  Allergen Reactions  Benadryl [Diphenhydramine] Other (See Comments)    Pt told not to take because of bypass surgery   Bromfed Nausea And Vomiting   Clarithromycin Other (See Comments)     gastritis   Codeine Other (See Comments)    Trouble breathing   Doxycycline Hyclate Nausea And Vomiting   Lexapro [Escitalopram]     Dizziness   Modafinil Other (See Comments)     anxiety-nervousness   Oxycodone-Acetaminophen Itching   Promethazine Hcl Other (See Comments)     fainting   Quinolones Nausea Only    Cipro "felt real bad"   Telithromycin Nausea And Vomiting   Tramadol Nausea Only   Hydrocodone-Acetaminophen Rash   Statins Other (See Comments)    Leg cramps and makes patient feel bad. Per pt tried multiple in years past   Sulfonamide Derivatives Rash    Social History   Social History Narrative   Married 42 years in 2015. 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 Oropharynx normal Neck: no thyromegaly or cervical lymphadenopathy CV: RRR no murmurs rubs or gallops Lungs: CTAB no crackles, wheeze, rhonchi Abdomen: soft/nontender/nondistended/normal bowel sounds. No rebound or guarding.  Ext: Trace edema Skin: warm, dry Neuro: grossly normal, moves all extremities, PERRLA    Assessment and Plan  81 y.o. male presenting for annual physical.  Health Maintenance counseling: 1. Anticipatory guidance: Patient counseled regarding regular dental exams -q6 months, eye exams -yearly,  avoiding smoking and second hand smoke , limiting alcohol to 2  beverages per day - maybe 2-3 per week, no illicit drugs.   2. Risk factor reduction:  Advised patient of need for regular exercise and diet rich and fruits and vegetables to reduce risk of heart attack and stroke.  Exercise- trying but having some issues due to neuropathy and back pain- was doing some PT exercises and actually hurt his back- has upcoming aquatic therapy and upcoming MRI.  Diet/weight management-Down 11 pounds from last physical!- tried to reduce portion sizes.  Wt Readings from Last 3 Encounters:  05/13/21 202 lb (91.6 kg)  05/08/21 203 lb (92.1 kg)  05/07/21 202 lb (91.6 kg)  3. Immunizations/screenings/ancillary studies-up-to-date other than tetanus considering laceration  Immunization History  Administered Date(s) Administered   Fluad Quad(high Dose 65+) 11/21/2018, 12/11/2020   Influenza Split 01/08/2012, 11/12/2014   Influenza Whole 12/08/2007, 12/26/2008, 12/22/2010   Influenza, High Dose Seasonal PF 11/16/2015, 01/02/2016, 01/01/2017, 01/06/2018, 02/01/2019, 11/24/2019, 01/23/2020   Influenza,inj,Quad PF,6+ Mos 11/30/2012   Influenza-Unspecified 01/12/2014, 12/21/2017   PFIZER Comirnaty(Gray Top)Covid-19 Tri-Sucrose Vaccine 07/29/2020   PFIZER(Purple Top)SARS-COV-2 Vaccination 04/28/2019, 05/23/2019, 12/21/2019, 01/23/2020   Pfizer Covid-19 Vaccine Bivalent Booster 81yrs & up 12/11/2020   Pneumococcal Conjugate-13 05/31/2014   Pneumococcal Polysaccharide-23 06/04/2006, 07/15/2012, 01/02/2016, 12/31/2016, 01/23/2020   Td 04/23/2005, 05/13/2021   Tdap 02/18/2016   Zoster Recombinat (Shingrix) 01/31/2018, 04/02/2018   Zoster, Live 01/08/2012  4. Prostate cancer screening- history of  both bladder and prostate cancer followed by urology-they follow his PSA  Lab Results  Component Value Date   PSA <0.1 10/01/2017   PSA 0.00 (L) 04/06/2012   PSA 1.35 02/18/2010   5. Colon cancer screening - several adenomatous polyps on 05/07/21 but at his age plan is for no further  colonoscopy given will be above age 63 6. Skin cancer screening-follows with Dr. Ronnald Ramp of range per dermatology.advised regular sunscreen use. Denies worrisome, changing, or new skin lesions.  7. Smoking associated screening (lung cancer screening, AAA screen 65-75, UA)-former smoker-patient quit in the 1990s. 8. STD screening - only active with wife and that is limited- he states not active  Status of chronic or acute concerns   #CAD/hyperlipidemia-history of CABG x5- follows with Dr. Percival Spanish S: Meds: asa 81mg , praluent (next dose tomorrow and getting free) -Fatigue on Repatha in 2021-restarted on simvastatin 20 mg with plan for Zetia 10 mg p.o. as well but later placed back on praluent -Stent drug eluting placed 04/13/19 Dr. Angelena Form -Denies chest pain or shortness of breath- not pushing limits though but cna do stairs without issues  Lab Results  Component Value Date   CHOL 161 10/22/2020   HDL 50 10/22/2020   LDLCALC 85 10/22/2020   LDLDIRECT 115.0 06/04/2016   TRIG 147 10/22/2020   CHOLHDL 3.2 10/22/2020   A/P: CAD asymptomatic- not needing nitroglycerin regularly- used twice and thought actually was indigestion. Lipids hopefully improved- LDl goal under 70- continue current meds for now   #Hypertension/CKD stage III S: Compliant with amlodipine 5 mg, hydrochlorothiazide 25 mg and valsartan 320mg  -GFR has been stable.  On ARB in case proteinuric element  BP Readings from Last 3 Encounters:  05/13/21 118/62  05/08/21 104/60  05/07/21 (!) 151/82   A/P: Hypertension well-controlled-continue current medication.  CKD stage III likely stable-update CMP   #Depression, major, single episode S:  Medication: Buspirone 5 mg twice daily as needed-uses very sparingly - last dose 3 months ago Patient has not tolerated medication in the past-feels too tired and sleepy on SSRIs.  Later had dizziness on Lexapro -Has declined counseling and continues to decline counseling-has done this in the  past and only somewhat helpful but had to find the right fit -Dizziness resolved off Lexapro-see neurology visit 05/01/2020 Depression screen [facility] 2/9 05/13/2021 02/06/2021 11/04/2020  Decreased Interest 0 1 0  Down, Depressed, Hopeless 0 1 1  PHQ - 2 Score 0 2 1  Altered sleeping 1 1 0  Tired, decreased energy 1 0 3  Change in appetite 0 0 0  Feeling bad or failure about yourself  0 0 0  Trouble concentrating 0 0 0  Moving slowly or fidgety/restless 0 0 0  Suicidal thoughts 0 0 0  PHQ-9 Score 2 3 4   Difficult doing work/chores Not difficult at all Not difficult at all Not difficult at all  Some recent data might be hidden  A/P: Has been doing much better lately and appears to be in full remission-continue buspirone only as needed- very sparing  % Neuropathy-follows with [name] S: Compliant with gabapentin.  Burning/needle pain in the feet-has been worse in last week A/P: some worsening lately- -continue current medication and follow-up with neurology  #Hyperglycemia history of mildly elevated A1c.  Prediabetes range.   Lab Results  Component Value Date   HGBA1C 5.7 02/06/2021   HGBA1C 6.2 (H) 06/11/2020   HGBA1C 5.7 (H) 02/23/2020  -Continue to trend A1c with labs but discussed too soon  for repeat today   #senile purpura- noted. Stable. Check cbc at least annually  #Aortic atherosclerosis-Noted on CT 2016. Continue risk factor modification with blood pressure, lipid control, diabetes prevention  #b12 deficiency-on oral B12.  Change to every other day August 26, 2019 but went back to daily -update B12 today and likely continue current medication Lab Results  Component Value Date   [id number] 02/23/2020    #Incontinence/overactive bladder- in patient with history of prostate/bladder cancer fully treated S:Issues date back to prostatectomy and bladder cancer cautery in 2012.  Worsening symptoms 2021 and wanted to try medication-recommended against oxybutynin due to beers list  and potential increased fatigue-opted to try Myrbetriq-expensive and minimally effective.  -We discussed Gemtesa at last visit and plan was to trial this - reports 75% improvement!   A/P: doing very well- continu ecurrent meds   #MSK issues-working with Dr. Tamala Julian on ongoing back pain - left knee still bothering him- going to do aquatic therapy  #COVID-19-had this back in January-we discussed lingering symptoms- he states only fatigue.  He did have to increase Flovent to 2 puffs twice daily for a week  #Right forearm laceration-occurred last week after a fall-appears to be healing well but his last tetanus shot was over 5 years ago and we opted to go ahead and update this  Recommended follow up: Return in about 3 months (around 08/10/2021) for follow up- or sooner if needed. Future Appointments  Date Time Provider [place]  05/22/2021  8:45 AM Shelbie Hutching [facility]  05/22/2021 11:00 AM [facility] HEALTH COACH [facility]  05/24/2021  9:30 AM [facility] MR 3 GI-315MRI [facility] [address] [facility]  05/26/2021  8:15 AM Hilty, Nadean Corwin, MD [facility]  05/28/2021  2:00 PM Cameron Sprang, MD [facility] None  05/29/2021 10:30 AM Vedia Pereyra, PT [facility]  06/05/2021  9:30 AM Lorina Rabon, Stephani Police [facility]  06/09/2021  9:15 AM Chesley Mires, MD [facility] None  06/11/2021 11:00 AM Shelbie Hutching [facility]  06/17/2021 10:15 AM Carlson-Long, Stephani Police [facility]   Lab/Order associations: fasting   ICD-10-CM   1. Preventative health care  Z00.00     2. Major depressive disorder with single episode, in full remission ([place])  F32.5     3. Aortic atherosclerosis (HCC) Chronic I70.0     4. Hyperlipidemia, unspecified hyperlipidemia type  E78.5 CBC with Differential/Platelet    Comprehensive metabolic panel    Lipid panel    5. Hyperglycemia  R73.9 Hemoglobin A1c    6. Neuropathy ([place])  G62.9     7. Senile purpura (HCC)  D69.2     8. Atherosclerosis of native coronary  artery of native heart without angina pectoris  I25.10     9. Laceration of right forearm, initial encounter  S51.811A Td : Tetanus/diphtheria >7yo Preservative  free    10. B12 deficiency  E53.8 B12      No orders of the defined types were placed in this encounter.   Return precautions advised.  [name], MD

## 2021-05-07 ENCOUNTER — Ambulatory Visit (AMBULATORY_SURGERY_CENTER): Payer: PPO | Admitting: Internal Medicine

## 2021-05-07 ENCOUNTER — Other Ambulatory Visit: Payer: Self-pay

## 2021-05-07 ENCOUNTER — Encounter: Payer: Self-pay | Admitting: Internal Medicine

## 2021-05-07 VITALS — BP 151/82 | HR 66 | Temp 97.3°F | Resp 13 | Ht 65.0 in | Wt 202.0 lb

## 2021-05-07 DIAGNOSIS — K5730 Diverticulosis of large intestine without perforation or abscess without bleeding: Secondary | ICD-10-CM

## 2021-05-07 DIAGNOSIS — R140 Abdominal distension (gaseous): Secondary | ICD-10-CM | POA: Diagnosis not present

## 2021-05-07 DIAGNOSIS — D120 Benign neoplasm of cecum: Secondary | ICD-10-CM

## 2021-05-07 DIAGNOSIS — D128 Benign neoplasm of rectum: Secondary | ICD-10-CM | POA: Diagnosis not present

## 2021-05-07 DIAGNOSIS — R1084 Generalized abdominal pain: Secondary | ICD-10-CM | POA: Diagnosis not present

## 2021-05-07 DIAGNOSIS — D124 Benign neoplasm of descending colon: Secondary | ICD-10-CM

## 2021-05-07 DIAGNOSIS — D129 Benign neoplasm of anus and anal canal: Secondary | ICD-10-CM

## 2021-05-07 MED ORDER — SODIUM CHLORIDE 0.9 % IV SOLN: 500.0000 mL | Freq: Once | INTRAVENOUS | Status: DC

## 2021-05-07 MED ORDER — FLEET ENEMA 7-19 GM/118ML RE ENEM
1.0000 | ENEMA | Freq: Once | RECTAL | Status: AC
  Administered 2021-05-07: 1 via RECTAL

## 2021-05-07 NOTE — Op Note (Signed)
[place] Patient Name: Joshua [name] Procedure Date: 05/07/2021 9:09 AM MRN: [id number] Endoscopist: Jerene Bears , MD Age: 81 Referring MD:  Date of Birth: June 04, 1940 Gender: Male Account #: [id number] Procedure:                Colonoscopy Indications:              Generalized abdominal pain, abdominal bloating,                            symptoms improved of late though similar symptoms                            recurred during preparation for today's exam Medicines:                Monitored Anesthesia Care Procedure:                Pre-Anesthesia Assessment:                           - Prior to the procedure, a History and Physical                            was performed, and patient medications and                            allergies were reviewed. The patient's tolerance of                            previous anesthesia was also reviewed. The risks                            and benefits of the procedure and the sedation                            options and risks were discussed with the patient.                            All questions were answered, and informed consent                            was obtained. Prior Anticoagulants: The patient has                            taken no previous anticoagulant or antiplatelet                            agents. ASA Grade Assessment: III - A patient with                            severe systemic disease. After reviewing the risks                            and benefits, the patient was deemed in  satisfactory condition to undergo the procedure.                           After obtaining informed consent, the colonoscope                            was passed under direct vision. Throughout the                            procedure, the patient's blood pressure, pulse, and                            oxygen saturations were monitored continuously. The                            Colonoscope was  introduced through the anus and                            advanced to the cecum, identified by appendiceal                            orifice and ileocecal valve. The colonoscopy was                            performed without difficulty. The patient tolerated                            the procedure well. The quality of the bowel                            preparation was good. The ileocecal valve,                            appendiceal orifice, and rectum were photographed. Scope In: 9:23:21 AM Scope Out: 9:42:57 AM Scope Withdrawal Time: 0 hours 13 minutes 9 seconds  Total Procedure Duration: 0 hours 19 minutes 36 seconds  Findings:                 The digital rectal exam was normal.                           A 4 mm polyp was found in the cecum. The polyp was                            sessile. The polyp was removed with a cold snare.                            Resection and retrieval were complete.                           A 5 mm polyp was found in the descending colon. The                            polyp was sessile.  The polyp was removed with a                            cold snare. Resection and retrieval were complete.                           A 4 mm polyp was found in the proximal rectum. The                            polyp was sessile. The polyp was removed with a                            cold snare. Resection and retrieval were complete.                           A 9 mm polyp was found in the mid rectum. The polyp                            was sessile. The polyp was removed with a cold                            snare. Resection and retrieval were complete.                           Multiple small and large-mouthed diverticula were                            found in the sigmoid colon, descending colon and                            ascending colon.                           Internal hemorrhoids were found during                            retroflexion. The hemorrhoids  were small. Complications:            No immediate complications. Estimated Blood Loss:     Estimated blood loss was minimal. Impression:               - One 4 mm polyp in the cecum, removed with a cold                            snare. Resected and retrieved.                           - One 5 mm polyp in the descending colon, removed                            with a cold snare. Resected and retrieved.                           - One 4 mm polyp  in the proximal rectum, removed                            with a cold snare. Resected and retrieved.                           - One 9 mm polyp in the mid rectum, removed with a                            cold snare. Resected and retrieved.                           - Diverticulosis in the sigmoid colon, in the                            descending colon and in the ascending colon.                           - Internal hemorrhoids. Recommendation:           - Patient has a contact number available for                            emergencies. The signs and symptoms of potential                            delayed complications were discussed with the                            patient. Return to normal activities tomorrow.                            Written discharge instructions were provided to the                            patient.                           - Resume previous diet.                           - Continue present medications. If symptoms recur                            dicyclomine 10 mg TIDPRN could be tried for                            abdominal pain/cramping.                           - Await pathology results.                           - No repeat colonoscopy for surveillance due to age  at next interval exam. Jerene Bears, MD 05/07/2021 9:46:56 AM This report has been signed electronically.

## 2021-05-07 NOTE — Progress Notes (Signed)
[facility] [place] Phone: [phone] Subjective:    I'm seeing this patient by the request  of:  [name], MD  CC: back and knee pain   GLO:[id number]  03/28/2021 Known degenerative disc disease.  Patient pain weakness and is having more unfortunately exacerbation at the moment.  Do feel it is from him compensating for patient's knee replacement.  Patient is also been gaining weight that likely is not helping.  We discussed with patient about icing regimen and home exercises, discussed which activities to do which wants to avoid.  Would like patient to get started and be more physically active so I do think aquatic therapy would be more beneficial.  Follow-up again in 6 weeks  Update 05/08/2021 Joshua [name] is a 81 y.o. male coming in with complaint of lumbar spine and L knee pain. Patient has been doing PT. 2 sessions Patient states back pain not doing well. PT exercises are hurting back. No new complaints.   Ab [place] 04/21/2021 IMPRESSION: 1. 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 While the distal abdominal aorta is mildly prominent measuring 2.7 cm, by CT criteria, comparing to the more proximal normal aorta, the mild prominence does not meet the criteria for aneurysm. No follow-up imaging required. 2. The IVC and pancreas were obscured on today's study. 3. Bilateral renal cysts as above. 4. Coarsened increased echotexture of the liver is nonspecific. No focal mass.      Past Medical History:  Diagnosis Date   Acute medial meniscal tear    Anemia 2015   Arthritis    "middle finger right hand; right knee; neck" (02/06/2014)   Asthma    seasonal   Bladder cancer ([place]) 04/2010   "cauterized during prostate OR"   CAD (coronary artery disease)    CABG 2001   Diverticulosis    DJD (degenerative joint disease)    BACK   GERD (gastroesophageal reflux disease)    H/O inguinal hernia repair 12/2018   History of  gout    Hyperlipidemia    Hypertension    IBS (irritable bowel syndrome)    Obesity    OSA (obstructive sleep apnea)    USES CPAP    Pancreatitis ~ 1980   Peripheral vascular disease ([place])    Pre-diabetes    Prostate cancer ([place]) 04/2010   Past Surgical History:  Procedure Laterality Date   ANTERIOR CERVICAL DECOMP/DISCECTOMY FUSION  05/26/2006   PLATE PLACED   BACK SURGERY     CARDIAC CATHETERIZATION  11/20/1999   CORONARY ARTERY BYPASS GRAFT  11/20/1999   SVG-RI1-RI2, SVG-OM, SVG-dRCA   CORONARY STENT INTERVENTION N/A 04/13/2019   Procedure: CORONARY STENT INTERVENTION;  Surgeon: Burnell Blanks, MD;  Location: [facility];  Service: Cardiovascular;  Laterality: N/A;   HERNIA REPAIR     INGUINAL HERNIA REPAIR Left 01/02/2019   Procedure: OPEN LEFT INGUINAL HERNIA REPAIR WITH MESH;  Surgeon: Johnathan Hausen, MD;  Location: [facility];  Service: General;  Laterality: Left;   KNEE ARTHROSCOPY Right 1992; 04/2008   LAPAROSCOPIC CHOLECYSTECTOMY  2007   LEFT HEART CATH AND CORS/GRAFTS ANGIOGRAPHY N/A 04/13/2019   Procedure: LEFT HEART CATH AND CORS/GRAFTS ANGIOGRAPHY;  Surgeon: Burnell Blanks, MD;  Location: [facility];  Service: Cardiovascular;  Laterality: N/A;   NASAL SEPTUM SURGERY Right 2007   ROBOT ASSISTED LAPAROSCOPIC RADICAL PROSTATECTOMY  04/2010   THYROIDECTOMY, PARTIAL  1980   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  Procedure: LEFT TOTAL KNEE ARTHROPLASTY;  Surgeon: Melrose Nakayama, MD;  Location: [facility];  Service: Orthopedics;  Laterality: Left;   Social History   Socioeconomic History   Marital status: Married    Spouse name: Not on file   Number of children: Not on file   Years of education: Not on file   Highest education level: Not on file  Occupational History   Occupation: retired    Fish farm manager: RETIRED  Tobacco Use   Smoking status: Former    Packs/day: 1.00    Years: 35.00    Pack years: 35.00    Types: Cigarettes    Quit date:  06/16/1992    Years since quitting: 28.9   Smokeless tobacco: Never  Vaping Use   Vaping Use: Never used  Substance and Sexual Activity   Alcohol use: Yes    Alcohol/week: 2.0 standard drinks    Types: 2 Shots of liquor per week    Comment: occ   Drug use: No   Sexual activity: Not Currently    Partners: Female  Other Topics Concern   Not on file  Social History Narrative   Married 42 years in 2015. No kids (mumps at age 50)      Retired from [employer] in Engineer, technical sales.  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      Hobbies: poker, movies and tv, staying active      Right handed   2 level home with steps he uses   Social Determinants of Sales executive: Not on file  Food Insecurity: No Food Insecurity   Worried About Charity fundraiser in the Last Year: Never true   Arboriculturist in the Last Year: Never true  Transportation Needs: Not on file  Physical Activity: Not on file  Stress: Not on file  Social Connections: Not on file   Allergies  Allergen Reactions   Benadryl [Diphenhydramine] Other (See Comments)    Pt told not to take because of bypass surgery   Bromfed Nausea And Vomiting   Clarithromycin Other (See Comments)     gastritis   Codeine Other (See Comments)    Trouble breathing   Doxycycline Hyclate Nausea And Vomiting   Lexapro [Escitalopram]     Dizziness   Modafinil Other (See Comments)     anxiety-nervousness   Oxycodone-Acetaminophen Itching   Promethazine Hcl Other (See Comments)     fainting   Quinolones Nausea Only    Cipro "felt real bad"   Telithromycin Nausea And Vomiting   Tramadol Nausea Only   Hydrocodone-Acetaminophen Rash   Statins Other (See Comments)    Leg cramps and makes patient feel bad. Per pt tried multiple in years past   Sulfonamide Derivatives Rash   Family History  Problem Relation Age of Onset   Heart disease Mother    Hyperlipidemia Mother    Heart disease Father    Hyperlipidemia Father     Diabetes Brother    Colon cancer Neg Hx    Pancreatic cancer Neg Hx    Esophageal cancer Neg Hx    Stomach cancer Neg Hx    Liver cancer Neg Hx      Current Outpatient Medications (Cardiovascular):    Alirocumab (PRALUENT) 150 MG/ML SOAJ, INJECT 1 DOSE INTO THE SKIN EVERY 14 (FOURTEEN) DAYS.   amLODipine (NORVASC) 5 MG tablet, Take 1 tablet (5 mg total) by mouth daily.   EPINEPHrine 0.3 mg/0.3 mL IJ SOAJ injection,  Inject 0.3 mg into the muscle as needed for anaphylaxis. for allergic reaction (Patient not taking: Reported on 05/07/2021)   ezetimibe (ZETIA) 10 MG tablet, Take 10 mg by mouth daily.   hydrochlorothiazide (HYDRODIURIL) 25 MG tablet, TAKE 1 TABLET BY MOUTH EVERY DAY   nitroGLYCERIN (NITROSTAT) 0.4 MG SL tablet, PLACE 1 TABLET UNDER THE TONGUE EVERY 5 (FIVE) MINUTES AS NEEDED.   valsartan (DIOVAN) 320 MG tablet, Take 1 tablet (320 mg total) by mouth daily.  Current Outpatient Medications (Respiratory):    albuterol (PROAIR HFA) 108 (90 BASE) MCG/ACT inhaler, Inhale 1-2 puffs into the lungs every 6 (six) hours as needed for wheezing or shortness of breath.   benzonatate (TESSALON) 100 MG capsule, Take 1 capsule (100 mg total) by mouth every 8 (eight) hours. (Patient not taking: Reported on 05/07/2021)   FLOVENT HFA 110 MCG/ACT inhaler, Inhale 1 puff into the lungs 2 (two) times daily.    fluticasone (FLONASE) 50 MCG/ACT nasal spray, Place 1 spray into both nostrils 2 (two) times daily.    loratadine (CLARITIN) 10 MG tablet, Take 10 mg by mouth at bedtime.   Current Outpatient Medications (Analgesics):    acetaminophen (TYLENOL) 500 MG tablet, Take 1,000 mg by mouth every 6 (six) hours as needed for moderate pain or headache.   aspirin EC 81 MG tablet, Take 1 tablet (81 mg total) by mouth 2 (two) times daily after a meal.  Current Outpatient Medications (Hematological):    Cyanocobalamin (B-12) 2500 MCG TABS, Take 1,250 mcg by mouth daily.   folic acid (FOLVITE) 098 MCG tablet,  Take 800 mcg by mouth daily.   Current Outpatient Medications (Other):    busPIRone (BUSPAR) 5 MG tablet, Take 1 tablet (5 mg total) by mouth 2 (two) times daily as needed (anxiety). (Patient not taking: Reported on 05/07/2021)   cholecalciferol (VITAMIN D3) 25 MCG (1000 UNIT) tablet, Take 1,000 Units by mouth daily.   clotrimazole-betamethasone (LOTRISONE) cream, Apply 1 application topically See admin instructions. Twice daily every 4 days (Patient not taking: Reported on 05/07/2021)   diazepam (VALIUM) 5 MG tablet, Take 1 tablet (5 mg total) by mouth at bedtime as needed (sleep).   gabapentin (NEURONTIN) 300 MG capsule, Take 1 cap in AM, 1 cap at noon, 2 caps at bedtime (Patient taking differently: Take 300-600 mg by mouth See admin instructions. Take 300 mg in the morning, 300 mg at noon, and 600 mg at bedtime)   Glucos-Chond-Hyal Ac-Ca Fructo (MOVE FREE JOINT HEALTH ADVANCE PO), Take 1 tablet by mouth daily.   ketoconazole (NIZORAL) 2 % cream, Apply 1 application topically See admin instructions. Twice a day every other day   Miconazole Nitrate (LOTRIMIN AF DEODORANT POWDER) 2 % AERP, Apply 1 spray topically daily as needed (jock itch). (Patient not taking: Reported on 05/07/2021)   nystatin-triamcinolone ointment (MYCOLOG), Apply 1 application topically See admin instructions. Twice daily every 4 days (Patient not taking: Reported on 05/07/2021)   pantoprazole (PROTONIX) 40 MG tablet, TAKE 1 TABLET BY MOUTH EVERY DAY   polyethylene glycol powder (GLYCOLAX/MIRALAX) powder, Take 17 g by mouth daily.   tiZANidine (ZANAFLEX) 4 MG tablet, Take 1 tablet (4 mg total) by mouth every 6 (six) hours as needed for muscle spasms. (Patient not taking: Reported on 05/07/2021)   Vibegron (GEMTESA) 75 MG TABS, Take 75 mg by mouth daily.   Wheat Dextrin (BENEFIBER) POWD, Take 1 Dose by mouth daily. 1 dose = 2 teaspoons   Reviewed prior external information including notes and  imaging from  primary care  provider As well as notes that were available from care everywhere and other healthcare systems.  Past medical history, social, surgical and family history all reviewed in electronic medical record.  No pertanent information unless stated regarding to the chief complaint.   Review of Systems:  No headache, visual changes, nausea, vomiting, diarrhea, constipation, dizziness, abdominal pain, skin rash, fevers, chills, night sweats, weight loss, swollen lymph nodes, joint swelling, chest pain, shortness of breath, mood changes. POSITIVE muscle aches, body aches  Objective  Blood pressure 104/60, pulse 91, height 5\' 5"  (1.651 m), weight 203 lb (92.1 kg), SpO2 95 %.   General: No apparent distress alert and oriented x3 mood and affect normal, dressed appropriately.  Overweight HEENT: Pupils equal, extraocular movements intact  Respiratory: Patient's speak in full sentences and does not appear short of breath  Cardiovascular: No lower extremity edema, non tender, no erythema  Gait antalgic favoring the left leg MSK: Low back exam does have significant loss of lordosis.  The patient has limited range of motion in all planes.  Worsening pain with extension greater than 5 degrees.  Positive possible straight leg test on the left side and patient is unable to straighten her leg out away secondary to the knee replacement.   Impression and Recommendations:    The above documentation has been reviewed and is accurate and complete Lyndal Pulley, DO

## 2021-05-07 NOTE — Progress Notes (Signed)
Called to room to assist during endoscopic procedure.  Patient ID and intended procedure confirmed with present staff. Received instructions for my participation in the procedure from the performing physician.  

## 2021-05-07 NOTE — Progress Notes (Signed)
Pt's states no medical or surgical changes since previsit or office visit. 

## 2021-05-07 NOTE — Progress Notes (Signed)
GASTROENTEROLOGY PROCEDURE H&P NOTE   Primary Care Physician: [name], MD    Reason for Procedure:  Abdominal pain and bloating  Plan:    Colonoscopy  Patient is appropriate for endoscopic procedure(s) in the ambulatory ([place]) setting.  The nature of the procedure, as well as the risks, benefits, and alternatives were carefully and thoroughly reviewed with the patient. Ample time for discussion and questions allowed. The patient understood, was satisfied, and agreed to proceed.     HPI: Joshua [name] is a 81 y.o. male who presents for colonoscopy to evaluate abdominal pain and bloating.  Symptoms had gotten better but with the prep last night the previous pain and bloating he was feeling returned.  No pain currently.  Medical history as below.  Tolerated the prep.  No recent chest pain or shortness of breath.    Past Medical History:  Diagnosis Date   Acute medial meniscal tear    Anemia 2015   Arthritis    "middle finger right hand; right knee; neck" (02/06/2014)   Asthma    seasonal   Bladder cancer ([place]) 04/2010   "cauterized during prostate OR"   CAD (coronary artery disease)    CABG 2001   Diverticulosis    DJD (degenerative joint disease)    BACK   GERD (gastroesophageal reflux disease)    H/O inguinal hernia repair 12/2018   History of gout    Hyperlipidemia    Hypertension    IBS (irritable bowel syndrome)    Obesity    OSA (obstructive sleep apnea)    USES CPAP    Pancreatitis ~ 1980   Peripheral vascular disease ([place])    Pre-diabetes    Prostate cancer ([place]) 04/2010    Past Surgical History:  Procedure Laterality Date   ANTERIOR CERVICAL DECOMP/DISCECTOMY FUSION  05/26/2006   PLATE PLACED   BACK SURGERY     CARDIAC CATHETERIZATION  11/20/1999   CORONARY ARTERY BYPASS GRAFT  11/20/1999   SVG-RI1-RI2, SVG-OM, SVG-dRCA   CORONARY STENT INTERVENTION N/A 04/13/2019   Procedure: CORONARY STENT INTERVENTION;  Surgeon: Burnell Blanks,  MD;  Location: [facility];  Service: Cardiovascular;  Laterality: N/A;   HERNIA REPAIR     INGUINAL HERNIA REPAIR Left 01/02/2019   Procedure: OPEN LEFT INGUINAL HERNIA REPAIR WITH MESH;  Surgeon: Johnathan Hausen, MD;  Location: [facility];  Service: General;  Laterality: Left;   KNEE ARTHROSCOPY Right 1992; 04/2008   LAPAROSCOPIC CHOLECYSTECTOMY  2007   LEFT HEART CATH AND CORS/GRAFTS ANGIOGRAPHY N/A 04/13/2019   Procedure: LEFT HEART CATH AND CORS/GRAFTS ANGIOGRAPHY;  Surgeon: Burnell Blanks, MD;  Location: [facility];  Service: Cardiovascular;  Laterality: N/A;   NASAL SEPTUM SURGERY Right 2007   ROBOT ASSISTED LAPAROSCOPIC RADICAL PROSTATECTOMY  04/2010   THYROIDECTOMY, PARTIAL  1980   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   Procedure: LEFT TOTAL KNEE ARTHROPLASTY;  Surgeon: Melrose Nakayama, MD;  Location: [facility];  Service: Orthopedics;  Laterality: Left;    Prior to Admission medications   Medication Sig Start Date End Date Taking? Authorizing Provider  acetaminophen (TYLENOL) 500 MG tablet Take 1,000 mg by mouth every 6 (six) hours as needed for moderate pain or headache.   Yes [provider]  Alirocumab (PRALUENT) 150 MG/ML SOAJ INJECT 1 DOSE INTO THE SKIN EVERY 14 (FOURTEEN) DAYS. 01/14/21  Yes Hilty, Nadean Corwin, MD  amLODipine (NORVASC) 5 MG tablet Take 1 tablet (5 mg total) by mouth daily. 07/15/20  Yes Pixie Casino, MD  aspirin EC 81 MG tablet Take 1 tablet (81 mg total) by mouth 2 (two) times daily after a meal. 06/18/20  Yes Loni Dolly, PA-C  cholecalciferol (VITAMIN D3) 25 MCG (1000 UNIT) tablet Take 1,000 Units by mouth daily.   Yes [provider]  Cyanocobalamin (B-12) 2500 MCG TABS Take 1,250 mcg by mouth daily.   Yes [provider]  diazepam (VALIUM) 5 MG tablet Take 1 tablet (5 mg total) by mouth at bedtime as needed (sleep). 10/26/20  Yes [name], MD  ezetimibe (ZETIA) 10 MG tablet Take 10 mg by mouth daily.  02/25/21  Yes [provider]  FLOVENT HFA 110 MCG/ACT inhaler Inhale 1 puff into the lungs 2 (two) times daily.  07/17/14  Yes [provider]  fluticasone (FLONASE) 50 MCG/ACT nasal spray Place 1 spray into both nostrils 2 (two) times daily.  09/05/12  Yes Ricard Dillon, MD  folic acid (FOLVITE) 347 MCG tablet Take 800 mcg by mouth daily.    Yes [provider]  gabapentin (NEURONTIN) 300 MG capsule Take 1 cap in AM, 1 cap at noon, 2 caps at bedtime Patient taking differently: Take 300-600 mg by mouth See admin instructions. Take 300 mg in the morning, 300 mg at noon, and 600 mg at bedtime 05/01/20  Yes Cameron Sprang, MD  Glucos-Chond-Hyal Ac-Ca Fructo (MOVE FREE JOINT HEALTH ADVANCE PO) Take 1 tablet by mouth daily.   Yes [provider]  hydrochlorothiazide (HYDRODIURIL) 25 MG tablet TAKE 1 TABLET BY MOUTH EVERY DAY 10/03/20  Yes [name], MD  ketoconazole (NIZORAL) 2 % cream Apply 1 application topically See admin instructions. Twice a day every other day 02/01/19  Yes [provider]  loratadine (CLARITIN) 10 MG tablet Take 10 mg by mouth at bedtime.    Yes [provider]  pantoprazole (PROTONIX) 40 MG tablet TAKE 1 TABLET BY MOUTH EVERY DAY 06/25/20  Yes Hilty, Nadean Corwin, MD  polyethylene glycol powder (GLYCOLAX/MIRALAX) powder Take 17 g by mouth daily. 08/29/14  Yes Tanna Furry, MD  valsartan (DIOVAN) 320 MG tablet Take 1 tablet (320 mg total) by mouth daily. 08/08/20  Yes [name], MD  Vibegron (GEMTESA) 75 MG TABS Take 75 mg by mouth daily. 04/09/21  Yes [name], MD  Wheat Dextrin (BENEFIBER) POWD Take 1 Dose by mouth daily. 1 dose = 2 teaspoons   Yes [provider]  albuterol (PROAIR HFA) 108 (90 BASE) MCG/ACT inhaler Inhale 1-2 puffs into the lungs every 6 (six) hours as needed for wheezing or shortness of breath. 10/19/14   [name], MD  benzonatate (TESSALON) 100 MG capsule Take 1 capsule (100  mg total) by mouth every 8 (eight) hours. Patient not taking: Reported on 05/07/2021 04/08/21   Redwine, Madison A, PA-C  busPIRone (BUSPAR) 5 MG tablet Take 1 tablet (5 mg total) by mouth 2 (two) times daily as needed (anxiety). Patient not taking: Reported on 05/07/2021 01/01/21   [name], MD  clotrimazole-betamethasone (LOTRISONE) cream Apply 1 application topically See admin instructions. Twice daily every 4 days Patient not taking: Reported on 05/07/2021    [provider]  EPINEPHrine 0.3 mg/0.3 mL IJ SOAJ injection Inject 0.3 mg into the muscle as needed for anaphylaxis. for allergic reaction Patient not taking: Reported on 05/07/2021 04/24/19   [provider]  Miconazole Nitrate (LOTRIMIN AF DEODORANT POWDER) 2 % AERP Apply 1 spray topically daily  as needed (jock itch). Patient not taking: Reported on 05/07/2021    [provider]  nitroGLYCERIN (NITROSTAT) 0.4 MG SL tablet PLACE 1 TABLET UNDER THE TONGUE EVERY 5 (FIVE) MINUTES AS NEEDED. 03/11/20   [name], MD  nystatin-triamcinolone ointment [facility]) Apply 1 application topically See admin instructions. Twice daily every 4 days Patient not taking: Reported on 05/07/2021    [provider]  tiZANidine (ZANAFLEX) 4 MG tablet Take 1 tablet (4 mg total) by mouth every 6 (six) hours as needed for muscle spasms. Patient not taking: Reported on 05/07/2021 06/18/20 06/18/21  Loni Dolly, PA-C    Current Outpatient Medications  Medication Sig Dispense Refill   acetaminophen (TYLENOL) 500 MG tablet Take 1,000 mg by mouth every 6 (six) hours as needed for moderate pain or headache.     Alirocumab (PRALUENT) 150 MG/ML SOAJ INJECT 1 DOSE INTO THE SKIN EVERY 14 (FOURTEEN) DAYS. 2 mL 11   amLODipine (NORVASC) 5 MG tablet Take 1 tablet (5 mg total) by mouth daily. 90 tablet 3   aspirin EC 81 MG tablet Take 1 tablet (81 mg total) by mouth 2 (two) times daily after a meal. 30 tablet 11   cholecalciferol  (VITAMIN D3) 25 MCG (1000 UNIT) tablet Take 1,000 Units by mouth daily.     Cyanocobalamin (B-12) 2500 MCG TABS Take 1,250 mcg by mouth daily.     diazepam (VALIUM) 5 MG tablet Take 1 tablet (5 mg total) by mouth at bedtime as needed (sleep). 30 tablet 1   ezetimibe (ZETIA) 10 MG tablet Take 10 mg by mouth daily.     FLOVENT HFA 110 MCG/ACT inhaler Inhale 1 puff into the lungs 2 (two) times daily.   5   fluticasone (FLONASE) 50 MCG/ACT nasal spray Place 1 spray into both nostrils 2 (two) times daily.      folic acid (FOLVITE) 433 MCG tablet Take 800 mcg by mouth daily.      gabapentin (NEURONTIN) 300 MG capsule Take 1 cap in AM, 1 cap at noon, 2 caps at bedtime (Patient taking differently: Take 300-600 mg by mouth See admin instructions. Take 300 mg in the morning, 300 mg at noon, and 600 mg at bedtime) 360 capsule 3   Glucos-Chond-Hyal Ac-Ca Fructo (MOVE FREE JOINT HEALTH ADVANCE PO) Take 1 tablet by mouth daily.     hydrochlorothiazide (HYDRODIURIL) 25 MG tablet TAKE 1 TABLET BY MOUTH EVERY DAY 90 tablet 2   ketoconazole (NIZORAL) 2 % cream Apply 1 application topically See admin instructions. Twice a day every other day     loratadine (CLARITIN) 10 MG tablet Take 10 mg by mouth at bedtime.      pantoprazole (PROTONIX) 40 MG tablet TAKE 1 TABLET BY MOUTH EVERY DAY 90 tablet 3   polyethylene glycol powder (GLYCOLAX/MIRALAX) powder Take 17 g by mouth daily. 255 g 0   valsartan (DIOVAN) 320 MG tablet Take 1 tablet (320 mg total) by mouth daily. 90 tablet 3   Vibegron (GEMTESA) 75 MG TABS Take 75 mg by mouth daily. 30 tablet 11   Wheat Dextrin (BENEFIBER) POWD Take 1 Dose by mouth daily. 1 dose = 2 teaspoons     albuterol (PROAIR HFA) 108 (90 BASE) MCG/ACT inhaler Inhale 1-2 puffs into the lungs every 6 (six) hours as needed for wheezing or shortness of breath. 18 g 3   benzonatate (TESSALON) 100 MG capsule Take 1 capsule (100 mg total) by mouth every 8 (eight) hours. (Patient not taking: Reported on  05/07/2021) 21 capsule 0   busPIRone (BUSPAR) 5 MG tablet Take 1 tablet (5 mg total) by mouth 2 (two) times daily as needed (anxiety). (Patient not taking: Reported on 05/07/2021) 40 tablet 2   clotrimazole-betamethasone (LOTRISONE) cream Apply 1 application topically See admin instructions. Twice daily every 4 days (Patient not taking: Reported on 05/07/2021)     EPINEPHrine 0.3 mg/0.3 mL IJ SOAJ injection Inject 0.3 mg into the muscle as needed for anaphylaxis. for allergic reaction (Patient not taking: Reported on 05/07/2021)     Miconazole Nitrate (LOTRIMIN AF DEODORANT POWDER) 2 % AERP Apply 1 spray topically daily as needed (jock itch). (Patient not taking: Reported on 05/07/2021)     nitroGLYCERIN (NITROSTAT) 0.4 MG SL tablet PLACE 1 TABLET UNDER THE TONGUE EVERY 5 (FIVE) MINUTES AS NEEDED. 75 tablet 1   nystatin-triamcinolone ointment (MYCOLOG) Apply 1 application topically See admin instructions. Twice daily every 4 days (Patient not taking: Reported on 05/07/2021)     tiZANidine (ZANAFLEX) 4 MG tablet Take 1 tablet (4 mg total) by mouth every 6 (six) hours as needed for muscle spasms. (Patient not taking: Reported on 05/07/2021) 40 tablet 1   Current Facility-Administered Medications  Medication Dose Route Frequency Provider Last Rate Last Admin   0.9 %  sodium chloride infusion  500 mL Intravenous Once Verl Whitmore, Lajuan Lines, MD        Allergies as of 05/07/2021 - Review Complete 05/07/2021  Allergen Reaction Noted   Benadryl [diphenhydramine] Other (See Comments) 02/06/2014   Bromfed Nausea And Vomiting 12/14/2006   Clarithromycin Other (See Comments) 10/12/2006   Codeine Other (See Comments)    Doxycycline hyclate Nausea And Vomiting 12/14/2006   Lexapro [escitalopram]  08/21/2020   Modafinil Other (See Comments) 12/14/2006   Oxycodone-acetaminophen Itching 11/25/2016   Promethazine hcl Other (See Comments) 12/14/2006   Quinolones Nausea Only 09/20/2014   Telithromycin Nausea And Vomiting  12/14/2006   Tramadol Nausea Only 07/29/2020   Hydrocodone-acetaminophen Rash 12/14/2006   Statins Other (See Comments) 04/14/2019   Sulfonamide derivatives Rash     Family History  Problem Relation Age of Onset   Heart disease Mother    Hyperlipidemia Mother    Heart disease Father    Hyperlipidemia Father    Diabetes Brother    Colon cancer Neg Hx    Pancreatic cancer Neg Hx    Esophageal cancer Neg Hx    Stomach cancer Neg Hx    Liver cancer Neg Hx     Social History   Socioeconomic History   Marital status: Married    Spouse name: Not on file   Number of children: Not on file   Years of education: Not on file   Highest education level: Not on file  Occupational History   Occupation: retired    Fish farm manager: RETIRED  Tobacco Use   Smoking status: Former    Packs/day: 1.00    Years: 35.00    Pack years: 35.00    Types: Cigarettes    Quit date: 06/16/1992    Years since quitting: 28.9   Smokeless tobacco: Never  Vaping Use   Vaping Use: Never used  Substance and Sexual Activity   Alcohol use: Yes    Alcohol/week: 2.0 standard drinks    Types: 2 Shots of liquor per week    Comment: occ   Drug use: No   Sexual activity: Not Currently    Partners: Female  Other Topics Concern   Not on file  Social History Narrative  Married 42 years in 2015. No kids (mumps at age 29)      Retired from [employer] in Engineer, technical sales.  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      Hobbies: poker, movies and tv, staying active      Right handed   2 level home with steps he uses   Social Determinants of Radio broadcast assistant Strain: Not on file  Food Insecurity: No Food Insecurity   Worried About Charity fundraiser in the Last Year: Never true   Arboriculturist in the Last Year: Never true  Transportation Needs: Not on file  Physical Activity: Not on file  Stress: Not on file  Social Connections: Not on file  Intimate Partner Violence: Not on file    Physical  Exam: Vital signs in last 24 hours: @BP  140/78    Pulse 75    Temp (!) 97.3 F (36.3 C) (Skin)    Ht 5\' 5"  (1.651 m)    Wt 202 lb (91.6 kg)    SpO2 98%    BMI 33.61 kg/m  GEN: NAD EYE: Sclerae anicteric ENT: MMM CV: Non-tachycardic Pulm: CTA b/l GI: Soft, NT/ND NEURO:  Alert & Oriented x 3   Zenovia Jarred, MD [facility]  05/07/2021 9:15 AM

## 2021-05-07 NOTE — Patient Instructions (Signed)
Discharge instructions given. Handouts on polyps,diverticulosis and hemorrhoids. Resume previous medications. YOU HAD AN ENDOSCOPIC PROCEDURE TODAY AT [facility]:   Refer to the procedure report that was given to you for any specific questions about what was found during the examination.  If the procedure report does not answer your questions, please call your gastroenterologist to clarify.  If you requested that your care partner not be given the details of your procedure findings, then the procedure report has been included in a sealed envelope for you to review at your convenience later.  YOU SHOULD EXPECT: Some feelings of bloating in the abdomen. Passage of more gas than usual.  Walking can help get rid of the air that was put into your GI tract during the procedure and reduce the bloating. If you had a lower endoscopy (such as a colonoscopy or flexible sigmoidoscopy) you may notice spotting of blood in your stool or on the toilet paper. If you underwent a bowel prep for your procedure, you may not have a normal bowel movement for a few days.  Please Note:  You might notice some irritation and congestion in your nose or some drainage.  This is from the oxygen used during your procedure.  There is no need for concern and it should clear up in a day or so.  SYMPTOMS TO REPORT IMMEDIATELY:  Following lower endoscopy (colonoscopy or flexible sigmoidoscopy):  Excessive amounts of blood in the stool  Significant tenderness or worsening of abdominal pains  Swelling of the abdomen that is new, acute  Fever of 100F or higher   For urgent or emergent issues, a gastroenterologist can be reached at any hour by calling [phone]. Do not use MyChart messaging for urgent concerns.    DIET:  We do recommend a small meal at first, but then you may proceed to your regular diet.  Drink plenty of fluids but you should avoid alcoholic beverages for 24 hours.  ACTIVITY:  You should  plan to take it easy for the rest of today and you should NOT DRIVE or use heavy machinery until tomorrow (because of the sedation medicines used during the test).    FOLLOW UP: Our staff will call the number listed on your records 48-72 hours following your procedure to check on you and address any questions or concerns that you may have regarding the information given to you following your procedure. If we do not reach you, we will leave a message.  We will attempt to reach you two times.  During this call, we will ask if you have developed any symptoms of COVID 19. If you develop any symptoms (ie: fever, flu-like symptoms, shortness of breath, cough etc.) before then, please call [phone].  If you test positive for Covid 19 in the 2 weeks post procedure, please call and report this information to [place].    If any biopsies were taken you will be contacted by phone or by letter within the next 1-3 weeks.  Please call us at [phone] if you have not heard about the biopsies in 3 weeks.    SIGNATURES/CONFIDENTIALITY: You and/or your care partner have signed paperwork which will be entered into your electronic medical record.  These signatures attest to the fact that that the information above on your After Visit Summary has been reviewed and is understood.  Full responsibility of the confidentiality of this discharge information lies with you and/or your care-partner.

## 2021-05-07 NOTE — Progress Notes (Signed)
PT taken to PACU. Monitors in place. VSS. Report given to RN. 

## 2021-05-08 ENCOUNTER — Ambulatory Visit (INDEPENDENT_AMBULATORY_CARE_PROVIDER_SITE_OTHER): Payer: PPO | Admitting: Family Medicine

## 2021-05-08 ENCOUNTER — Encounter: Payer: Self-pay | Admitting: Family Medicine

## 2021-05-08 ENCOUNTER — Telehealth: Payer: Self-pay | Admitting: Family Medicine

## 2021-05-08 VITALS — BP 104/60 | HR 91 | Ht 65.0 in | Wt 203.0 lb

## 2021-05-08 DIAGNOSIS — M5136 Other intervertebral disc degeneration, lumbar region: Secondary | ICD-10-CM | POA: Diagnosis not present

## 2021-05-08 DIAGNOSIS — M5442 Lumbago with sciatica, left side: Secondary | ICD-10-CM

## 2021-05-08 DIAGNOSIS — G8929 Other chronic pain: Secondary | ICD-10-CM

## 2021-05-08 NOTE — Assessment & Plan Note (Signed)
Worsening symptoms noted discussed HEP

## 2021-05-08 NOTE — Patient Instructions (Addendum)
[place] [phone] Call Today  When we receive your results we will contact you. We will talk next steps after

## 2021-05-08 NOTE — Telephone Encounter (Signed)
Patient called wanting to see if we had any gemtesa samples. Per Bevelyn Ngo we do not have any in office however patient can call next week to see if he have any. Patient has been made aware.

## 2021-05-09 ENCOUNTER — Telehealth: Payer: Self-pay

## 2021-05-09 ENCOUNTER — Other Ambulatory Visit: Payer: Self-pay | Admitting: Family Medicine

## 2021-05-09 ENCOUNTER — Encounter: Payer: Self-pay | Admitting: Internal Medicine

## 2021-05-09 ENCOUNTER — Telehealth: Payer: Self-pay | Admitting: Internal Medicine

## 2021-05-09 NOTE — Telephone Encounter (Signed)
Patient should not need samples.  Has approved PA for praluent through 03/22/22 and has been approved for [place] to cover the rest of his copay

## 2021-05-09 NOTE — Telephone Encounter (Signed)
Patient calling the office for samples of medication:   1.  What medication and dosage are you requesting samples for?Alirocumab (PRALUENT) 150 MG/ML SOAJ  2.  Are you currently out of this medication? Yes

## 2021-05-09 NOTE — Telephone Encounter (Signed)
°  Follow up Call-  Call back number 05/07/2021  Post procedure Call Back phone  # [phone]  Permission to leave phone message Yes  Some recent data might be hidden     Patient questions:  Do you have a fever, pain , or abdominal swelling? No. Pain Score  0 *  Have you tolerated food without any problems? Yes.    Have you been able to return to your normal activities? Yes.    Do you have any questions about your discharge instructions: Diet   No. Medications  No. Follow up visit  No.  Do you have questions or concerns about your Care? No.  Actions: * If pain score is 4 or above: No action needed, pain <4.

## 2021-05-09 NOTE — Telephone Encounter (Signed)
Spoke with patient who reports the medication came. I informed him the medication is approved through the end of December. He thanked everyone for helping him.

## 2021-05-11 ENCOUNTER — Encounter: Payer: Self-pay | Admitting: Family Medicine

## 2021-05-12 ENCOUNTER — Other Ambulatory Visit: Payer: Self-pay | Admitting: Family Medicine

## 2021-05-12 NOTE — Telephone Encounter (Signed)
Pt called stated previous message- patient was provided with office fax number- patient will call [facility] and will let them know office has not received forms yet.

## 2021-05-13 ENCOUNTER — Other Ambulatory Visit: Payer: Self-pay

## 2021-05-13 ENCOUNTER — Ambulatory Visit (INDEPENDENT_AMBULATORY_CARE_PROVIDER_SITE_OTHER): Payer: PPO | Admitting: Family Medicine

## 2021-05-13 ENCOUNTER — Encounter: Payer: Self-pay | Admitting: Family Medicine

## 2021-05-13 VITALS — BP 118/62 | HR 79 | Temp 98.0°F | Ht 65.0 in | Wt 202.0 lb

## 2021-05-13 DIAGNOSIS — D692 Other nonthrombocytopenic purpura: Secondary | ICD-10-CM | POA: Diagnosis not present

## 2021-05-13 DIAGNOSIS — F325 Major depressive disorder, single episode, in full remission: Secondary | ICD-10-CM

## 2021-05-13 DIAGNOSIS — S51811A Laceration without foreign body of right forearm, initial encounter: Secondary | ICD-10-CM | POA: Diagnosis not present

## 2021-05-13 DIAGNOSIS — R739 Hyperglycemia, unspecified: Secondary | ICD-10-CM | POA: Diagnosis not present

## 2021-05-13 DIAGNOSIS — E785 Hyperlipidemia, unspecified: Secondary | ICD-10-CM | POA: Diagnosis not present

## 2021-05-13 DIAGNOSIS — Z0000 Encounter for general adult medical examination without abnormal findings: Secondary | ICD-10-CM | POA: Diagnosis not present

## 2021-05-13 DIAGNOSIS — I2510 Atherosclerotic heart disease of native coronary artery without angina pectoris: Secondary | ICD-10-CM

## 2021-05-13 DIAGNOSIS — E538 Deficiency of other specified B group vitamins: Secondary | ICD-10-CM | POA: Diagnosis not present

## 2021-05-13 DIAGNOSIS — G629 Polyneuropathy, unspecified: Secondary | ICD-10-CM | POA: Diagnosis not present

## 2021-05-13 DIAGNOSIS — Z23 Encounter for immunization: Secondary | ICD-10-CM | POA: Diagnosis not present

## 2021-05-13 DIAGNOSIS — I700 Atherosclerosis of aorta: Secondary | ICD-10-CM | POA: Diagnosis not present

## 2021-05-13 LAB — CBC WITH DIFFERENTIAL/PLATELET
Basophils Absolute: 0.1 10*3/uL (ref 0.0–0.1)
Basophils Relative: 0.8 % (ref 0.0–3.0)
Eosinophils Absolute: 0.2 10*3/uL (ref 0.0–0.7)
Eosinophils Relative: 2.2 % (ref 0.0–5.0)
HCT: 42.7 % (ref 39.0–52.0)
Hemoglobin: 14.7 g/dL (ref 13.0–17.0)
Lymphocytes Relative: 18 % (ref 12.0–46.0)
Lymphs Abs: 1.3 10*3/uL (ref 0.7–4.0)
MCHC: 34.4 g/dL (ref 30.0–36.0)
MCV: 90.9 fl (ref 78.0–100.0)
Monocytes Absolute: 0.6 10*3/uL (ref 0.1–1.0)
Monocytes Relative: 8.6 % (ref 3.0–12.0)
Neutro Abs: 5.1 10*3/uL (ref 1.4–7.7)
Neutrophils Relative %: 70.4 % (ref 43.0–77.0)
Platelets: 246 10*3/uL (ref 150.0–400.0)
RBC: 4.69 Mil/uL (ref 4.22–5.81)
RDW: 13.2 % (ref 11.5–15.5)
WBC: 7.2 10*3/uL (ref 4.0–10.5)

## 2021-05-13 LAB — LIPID PANEL
Cholesterol: 129 mg/dL (ref 0–200)
HDL: 51.6 mg/dL (ref >39.00)
LDL Cholesterol: 48 mg/dL (ref 0–99)
NonHDL: 77.68
Total CHOL/HDL Ratio: 3
Triglycerides: 149 mg/dL (ref 0.0–149.0)
VLDL: 29.8 mg/dL (ref 0.0–40.0)

## 2021-05-13 LAB — COMPREHENSIVE METABOLIC PANEL
ALT: 18 U/L (ref 0–53)
AST: 19 U/L (ref 0–37)
Albumin: 4.3 g/dL (ref 3.5–5.2)
Alkaline Phosphatase: 59 U/L (ref 39–117)
BUN: 18 mg/dL (ref 6–23)
CO2: 32 mEq/L (ref 19–32)
Calcium: 10.2 mg/dL (ref 8.4–10.5)
Chloride: 99 mEq/L (ref 96–112)
Creatinine, Ser: 1.27 mg/dL (ref 0.40–1.50)
GFR: 54.21 mL/min — ABNORMAL LOW (ref >60.00)
Glucose, Bld: 105 mg/dL — ABNORMAL HIGH (ref 70–99)
Potassium: 4.1 mEq/L (ref 3.5–5.1)
Sodium: 136 mEq/L (ref 135–145)
Total Bilirubin: 0.9 mg/dL (ref 0.2–1.2)
Total Protein: 7.2 g/dL (ref 6.0–8.3)

## 2021-05-13 LAB — VITAMIN B12: Vitamin B-12: 804 pg/mL (ref 211–911)

## 2021-05-13 LAB — HEMOGLOBIN A1C: Hgb A1c MFr Bld: 5.7 % (ref 4.6–6.5)

## 2021-05-13 NOTE — Addendum Note (Signed)
Addended by: Clyde Lundborg A on: 05/13/2021 11:00 AM   Modules accepted: Orders

## 2021-05-13 NOTE — Patient Instructions (Addendum)
Td today under laceration right arm  Please stop by lab before you go If you have mychart- we will send your results within 3 business days of [place] receiving them.  If you do not have mychart- we will call you about results within 5 business days of [place] receiving them.  *please also note that you will see labs on mychart as soon as they post. I will later go in and write notes on them- will say "notes from Dr. Yong Channel"   Recommended follow up: Return in about 3 months (around 08/10/2021) for follow up- or sooner if needed.

## 2021-05-14 DIAGNOSIS — J3081 Allergic rhinitis due to animal (cat) (dog) hair and dander: Secondary | ICD-10-CM | POA: Diagnosis not present

## 2021-05-14 DIAGNOSIS — J3089 Other allergic rhinitis: Secondary | ICD-10-CM | POA: Diagnosis not present

## 2021-05-14 DIAGNOSIS — J301 Allergic rhinitis due to pollen: Secondary | ICD-10-CM | POA: Diagnosis not present

## 2021-05-14 DIAGNOSIS — G4733 Obstructive sleep apnea (adult) (pediatric): Secondary | ICD-10-CM | POA: Diagnosis not present

## 2021-05-15 ENCOUNTER — Ambulatory Visit: Payer: PPO

## 2021-05-21 ENCOUNTER — Encounter: Payer: Self-pay | Admitting: Internal Medicine

## 2021-05-21 ENCOUNTER — Ambulatory Visit (HOSPITAL_BASED_OUTPATIENT_CLINIC_OR_DEPARTMENT_OTHER): Payer: PPO | Admitting: Physical Therapy

## 2021-05-21 IMAGING — US ULTRASOUND SCROTUM DOPPLER COMPLETE
1 series · 14 of 25 positions shown · non-contrast
Comparison: None.

CLINICAL DATA: Acute left testicular pain.

EXAM:
SCROTAL ULTRASOUND
DOPPLER ULTRASOUND OF THE TESTICLES
TECHNIQUE: Complete ultrasound examination of the testicles, epididymis, and
other scrotal structures was performed. Color and spectral Doppler
ultrasound were also utilized to evaluate blood flow to the
testicles.

[Series 1: ultrasound scrotum doppler complete · 0.07mm/px · 14 of 77 slices shown]
[im 1/77]
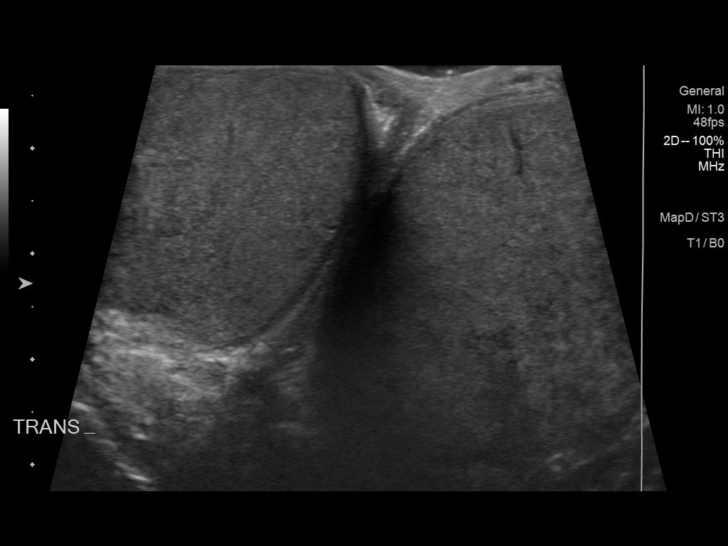
[im 7/77]
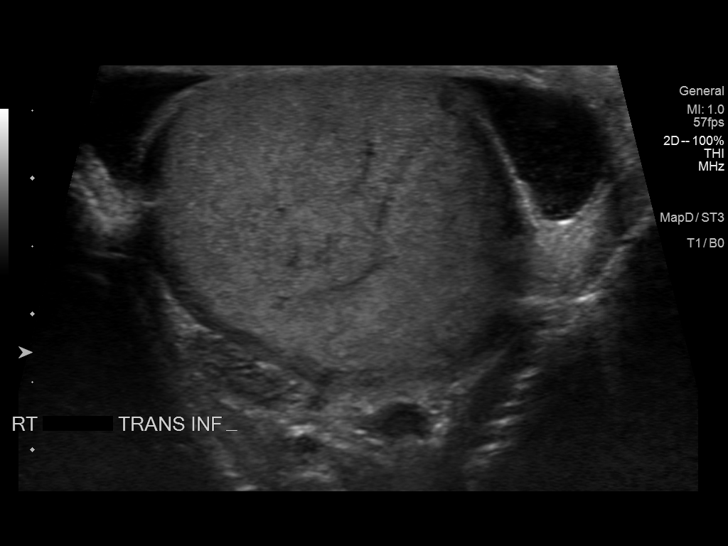
[im 13/77]
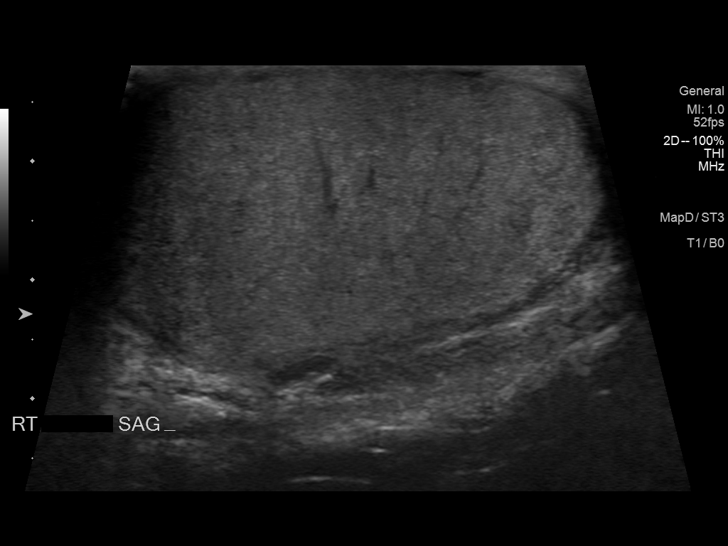
[im 20/77]
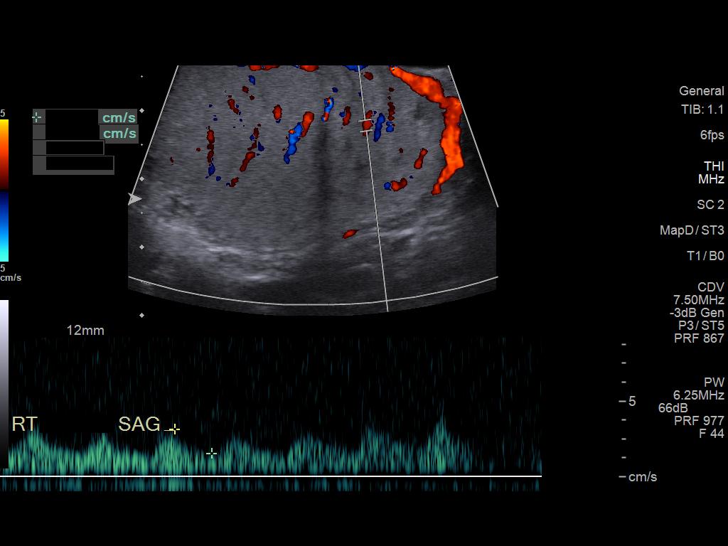
[im 26/77]
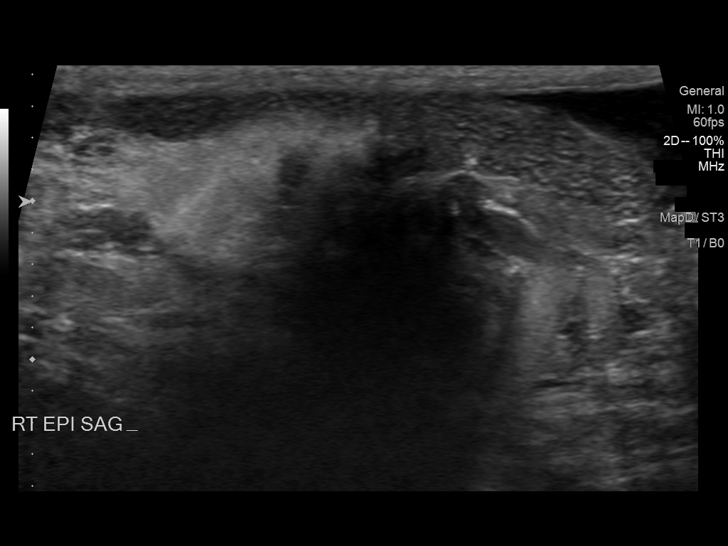
[im 29/77]
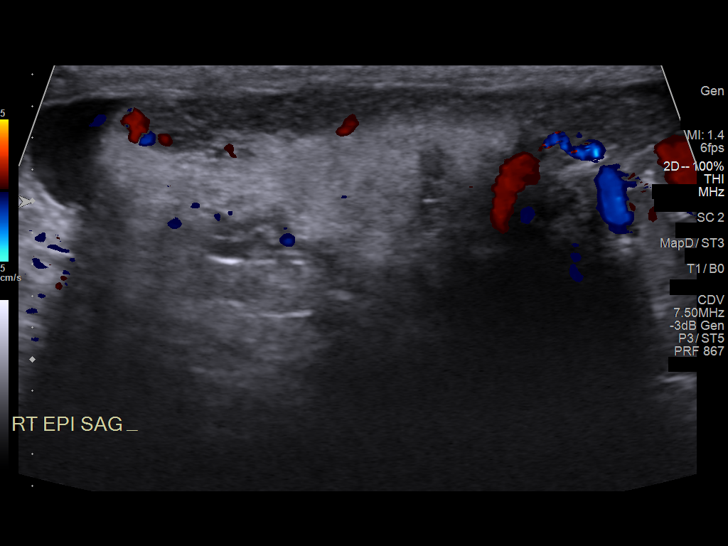
[im 35/77]
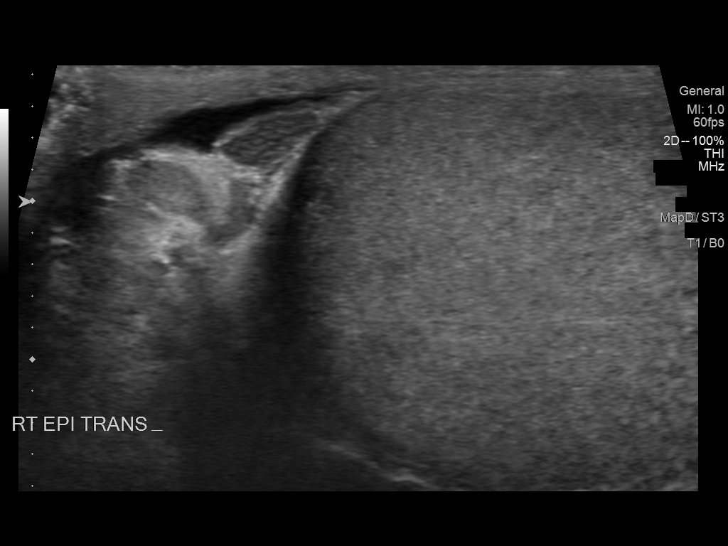
[im 42/77]
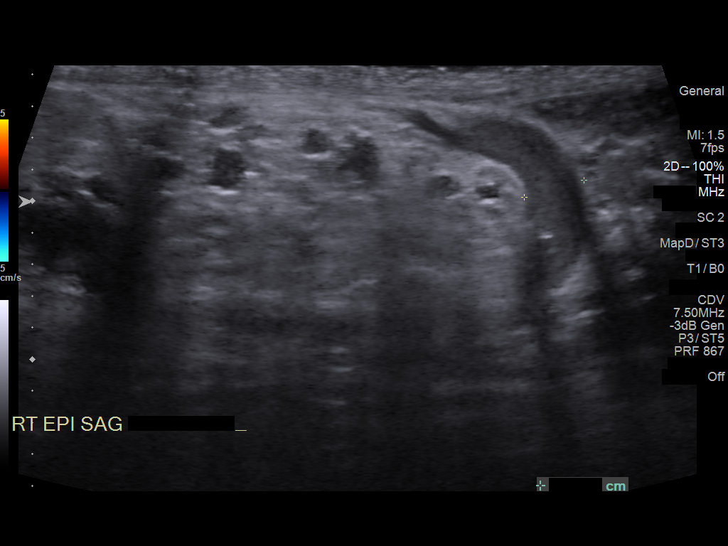
[im 48/77]
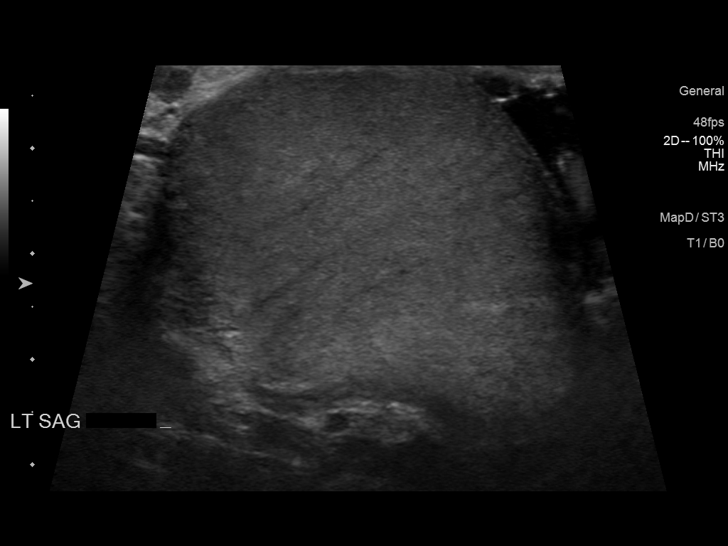
[im 51/77]
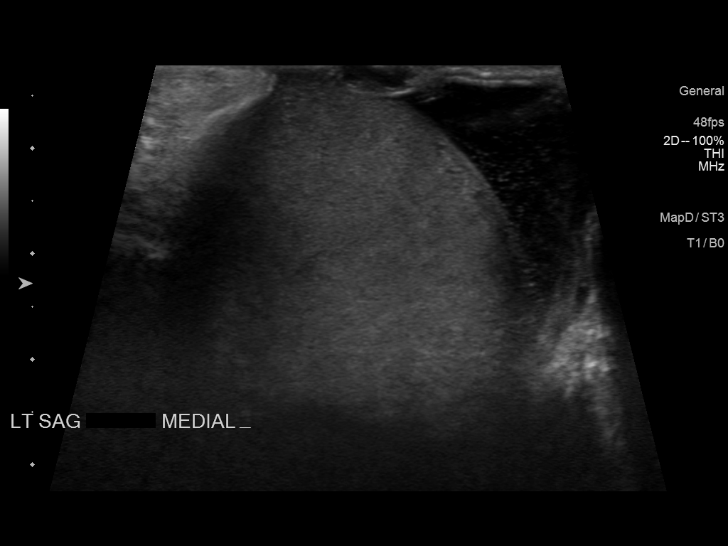
[im 58/77]
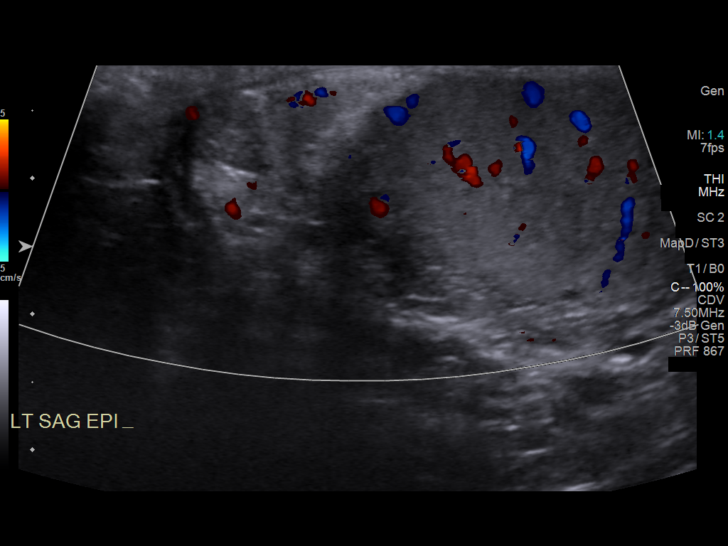
[im 64/77]
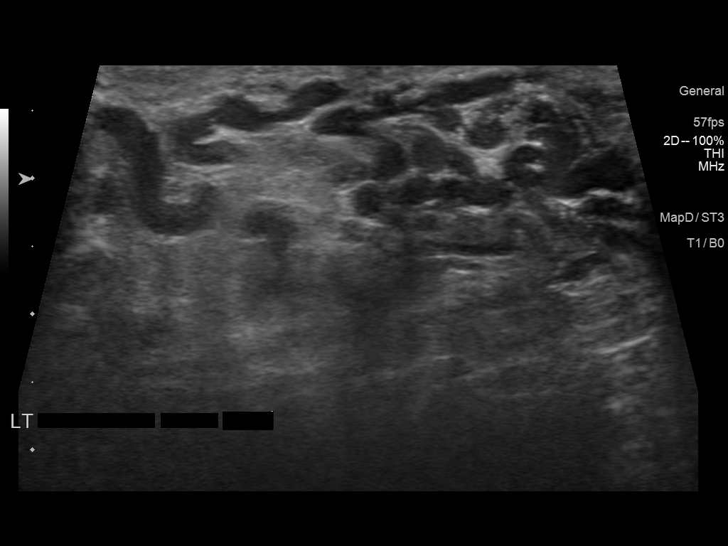
[im 70/77]
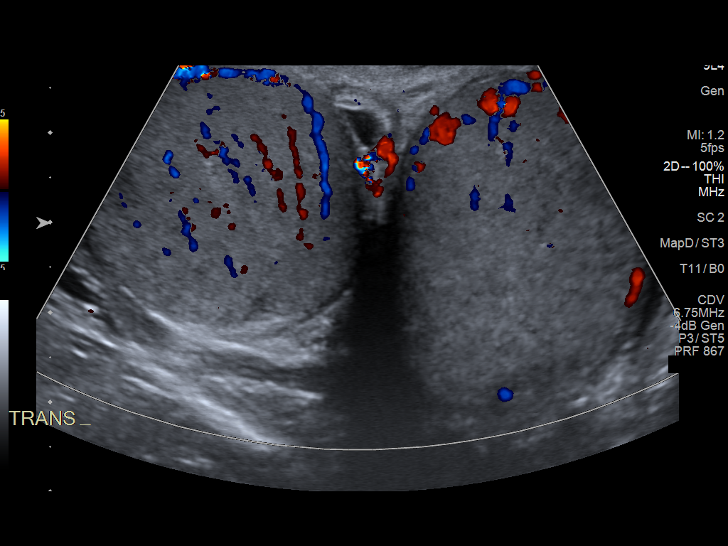
[im 77/77]
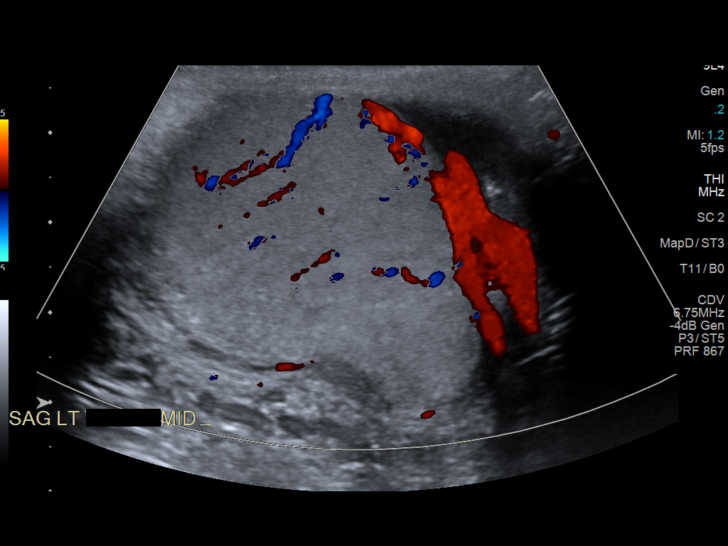

[14 of 25 positions shown; findings below may reference images not displayed]

FINDINGS: Right testicle

Measurements: 4.7 x 3.2 x 2.8 cm. No mass or microlithiasis
visualized.

Left testicle

Measurements: 4.5 x 3.3 x 3.0 cm. No mass or microlithiasis
visualized.

Right epididymis:  Normal in size and appearance.

Left epididymis:  Normal in size and appearance.

Hydrocele:  Small bilateral hydroceles are noted.

Varicocele:  Bilateral varicoceles are noted.

Pulsed Doppler interrogation of both testes demonstrates normal low
resistance arterial and venous waveforms bilaterally.
IMPRESSION: Bilateral varicoceles are noted. Small bilateral hydroceles are
noted. There is no evidence of testicular mass or torsion.

## 2021-05-22 ENCOUNTER — Ambulatory Visit (INDEPENDENT_AMBULATORY_CARE_PROVIDER_SITE_OTHER): Payer: PPO

## 2021-05-22 ENCOUNTER — Encounter (HOSPITAL_BASED_OUTPATIENT_CLINIC_OR_DEPARTMENT_OTHER): Payer: Self-pay

## 2021-05-22 ENCOUNTER — Ambulatory Visit (HOSPITAL_BASED_OUTPATIENT_CLINIC_OR_DEPARTMENT_OTHER): Payer: PPO | Admitting: Physical Therapy

## 2021-05-22 DIAGNOSIS — Z0000 Encounter for general adult medical examination without abnormal findings: Secondary | ICD-10-CM | POA: Diagnosis not present

## 2021-05-22 NOTE — Progress Notes (Addendum)
Virtual Visit via Telephone Note  I connected with  Joshua [name] on 05/22/21 at 11:00 AM EST by telephone and verified that I am speaking with the correct person using two identifiers.  Location: Patient: Home Provider: Office Persons participating in the virtual visit: patient/Nurse Health Advisor   I discussed the limitations, risks, security and privacy concerns of performing an evaluation and management service by telephone and the availability of in person appointments. The patient expressed understanding and agreed to proceed.  Interactive audio and video telecommunications were attempted between this nurse and patient, however failed, due to patient having technical difficulties OR patient did not have access to video capability.  We continued and completed visit with audio only.  Some vital signs may be absent or patient reported.   Willette Brace, LPN   Subjective:   Joshua [name] is a 81 y.o. male who presents for Medicare Annual/Subsequent preventive examination.  Review of Systems     Cardiac Risk Factors include: advanced age (>97men, >52 women);dyslipidemia;hypertension;male gender;obesity (BMI >30kg/m2)     Objective:    Today's Vitals   05/22/21 1100  PainSc: 5    There is no height or weight on file to calculate BMI.  Advanced Directives 05/22/2021 04/23/2021 04/08/2021 01/08/2021 08/10/2020 06/22/2020 06/18/2020  Does Patient Have a Medical Advance Directive? Yes Yes Yes Yes Yes No Yes  Type of Paramedic of [place];Living will - [place];Living will;Out of facility DNR (pink MOST or yellow form) Healthcare Power of [place];Living will  Does patient want to make changes to medical advance directive? - - - - - - No - Patient declined  Copy of [place] in Chart? Yes - validated most recent copy scanned in chart (See row information) - -  - - - No - copy requested  Would patient like information on creating a medical advance directive? - - - - - - -    Current Medications (verified) Outpatient Encounter Medications as of 05/22/2021  Medication Sig   acetaminophen (TYLENOL) 500 MG tablet Take 1,000 mg by mouth every 6 (six) hours as needed for moderate pain or headache.   albuterol (PROAIR HFA) 108 (90 BASE) MCG/ACT inhaler Inhale 1-2 puffs into the lungs every 6 (six) hours as needed for wheezing or shortness of breath.   Alirocumab (PRALUENT) 150 MG/ML SOAJ INJECT 1 DOSE INTO THE SKIN EVERY 14 (FOURTEEN) DAYS.   amLODipine (NORVASC) 5 MG tablet Take 1 tablet (5 mg total) by mouth daily.   aspirin EC 81 MG tablet Take 1 tablet (81 mg total) by mouth 2 (two) times daily after a meal.   busPIRone (BUSPAR) 5 MG tablet Take 1 tablet (5 mg total) by mouth 2 (two) times daily as needed (anxiety).   cholecalciferol (VITAMIN D3) 25 MCG (1000 UNIT) tablet Take 1,000 Units by mouth daily.   clotrimazole-betamethasone (LOTRISONE) cream Apply 1 application topically See admin instructions. Twice daily every 4 days   Cyanocobalamin (B-12) 2500 MCG TABS Take 1,250 mcg by mouth daily.   diazepam (VALIUM) 5 MG tablet Take 1 tablet (5 mg total) by mouth at bedtime as needed (sleep).   EPINEPHrine 0.3 mg/0.3 mL IJ SOAJ injection Inject 0.3 mg into the muscle as needed for anaphylaxis. for allergic reaction   ezetimibe (ZETIA) 10 MG tablet Take 10 mg by mouth daily.   FLOVENT HFA 110 MCG/ACT inhaler Inhale 1 puff into the lungs  2 (two) times daily.    fluticasone (FLONASE) 50 MCG/ACT nasal spray Place 1 spray into both nostrils 2 (two) times daily.    folic acid (FOLVITE) 381 MCG tablet Take 800 mcg by mouth daily.    gabapentin (NEURONTIN) 300 MG capsule Take 1 cap in AM, 1 cap at noon, 2 caps at bedtime (Patient taking differently: Take 300-600 mg by mouth See admin instructions. Take 300 mg in the morning, 300 mg at noon, and 600 mg at bedtime)    GEMTESA 75 MG TABS TAKE 1 TABLET BY MOUTH DAILY.   Glucos-Chond-Hyal Ac-Ca Fructo (MOVE FREE JOINT HEALTH ADVANCE PO) Take 1 tablet by mouth daily.   hydrochlorothiazide (HYDRODIURIL) 25 MG tablet TAKE 1 TABLET BY MOUTH EVERY DAY   ketoconazole (NIZORAL) 2 % cream Apply 1 application topically See admin instructions. Twice a day every other day   loratadine (CLARITIN) 10 MG tablet Take 10 mg by mouth at bedtime.    Miconazole Nitrate (LOTRIMIN AF DEODORANT POWDER) 2 % AERP Apply 1 spray topically daily as needed (jock itch).   nitroGLYCERIN (NITROSTAT) 0.4 MG SL tablet PLACE 1 TABLET UNDER THE TONGUE EVERY 5 (FIVE) MINUTES AS NEEDED.   nystatin-triamcinolone ointment (MYCOLOG) Apply 1 application topically See admin instructions. Twice daily every 4 days   pantoprazole (PROTONIX) 40 MG tablet TAKE 1 TABLET BY MOUTH EVERY DAY   polyethylene glycol powder (GLYCOLAX/MIRALAX) powder Take 17 g by mouth daily.   tiZANidine (ZANAFLEX) 4 MG tablet Take 1 tablet (4 mg total) by mouth every 6 (six) hours as needed for muscle spasms.   valsartan (DIOVAN) 320 MG tablet Take 1 tablet (320 mg total) by mouth daily.   Wheat Dextrin (BENEFIBER) POWD Take 1 Dose by mouth daily. 1 dose = 2 teaspoons   No facility-administered encounter medications on file as of 05/22/2021.    Allergies (verified) Benadryl [diphenhydramine], Bromfed, Clarithromycin, Codeine, Doxycycline hyclate, Lexapro [escitalopram], Modafinil, Oxycodone-acetaminophen, Promethazine hcl, Quinolones, Telithromycin, Tramadol, Hydrocodone-acetaminophen, Statins, and Sulfonamide derivatives   History: Past Medical History:  Diagnosis Date   Acute medial meniscal tear    Anemia 2015   Arthritis    "middle finger right hand; right knee; neck" (02/06/2014)   Asthma    seasonal   Bladder cancer ([place]) 04/2010   "cauterized during prostate OR"   CAD (coronary artery disease)    CABG 2001   Diverticulosis    DJD (degenerative joint disease)     BACK   GERD (gastroesophageal reflux disease)    H/O inguinal hernia repair 12/2018   History of gout    Hyperlipidemia    Hypertension    IBS (irritable bowel syndrome)    Obesity    OSA (obstructive sleep apnea)    USES CPAP    Pancreatitis ~ 1980   Peripheral vascular disease ([place])    Pre-diabetes    Prostate cancer ([place]) 04/2010   Past Surgical History:  Procedure Laterality Date   ANTERIOR CERVICAL DECOMP/DISCECTOMY FUSION  05/26/2006   PLATE PLACED   BACK SURGERY     CARDIAC CATHETERIZATION  11/20/1999   CORONARY ARTERY BYPASS GRAFT  11/20/1999   SVG-RI1-RI2, SVG-OM, SVG-dRCA   CORONARY STENT INTERVENTION N/A 04/13/2019   Procedure: CORONARY STENT INTERVENTION;  Surgeon: Burnell Blanks, MD;  Location: [facility];  Service: Cardiovascular;  Laterality: N/A;   HERNIA REPAIR     INGUINAL HERNIA REPAIR Left 01/02/2019   Procedure: OPEN LEFT INGUINAL HERNIA REPAIR WITH MESH;  Surgeon: Johnathan Hausen, MD;  Location: [facility];  Service: General;  Laterality: Left;   KNEE ARTHROSCOPY Right 1992; 04/2008   LAPAROSCOPIC CHOLECYSTECTOMY  2007   LEFT HEART CATH AND CORS/GRAFTS ANGIOGRAPHY N/A 04/13/2019   Procedure: LEFT HEART CATH AND CORS/GRAFTS ANGIOGRAPHY;  Surgeon: Burnell Blanks, MD;  Location: [facility];  Service: Cardiovascular;  Laterality: N/A;   NASAL SEPTUM SURGERY Right 2007   ROBOT ASSISTED LAPAROSCOPIC RADICAL PROSTATECTOMY  04/2010   THYROIDECTOMY, PARTIAL  1980   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   Procedure: LEFT TOTAL KNEE ARTHROPLASTY;  Surgeon: Melrose Nakayama, MD;  Location: [facility];  Service: Orthopedics;  Laterality: Left;   Family History  Problem Relation Age of Onset   Heart disease Mother    Hyperlipidemia Mother    Heart disease Father    Hyperlipidemia Father    Diabetes Brother    Colon cancer Neg Hx    Pancreatic cancer Neg Hx    Esophageal cancer Neg Hx    Stomach cancer Neg Hx    Liver cancer Neg Hx     Social History   Socioeconomic History   Marital status: Married    Spouse name: Not on file   Number of children: Not on file   Years of education: Not on file   Highest education level: Not on file  Occupational History   Occupation: retired    Fish farm manager: RETIRED  Tobacco Use   Smoking status: Former    Packs/day: 1.00    Years: 35.00    Pack years: 35.00    Types: Cigarettes    Quit date: 06/16/1992    Years since quitting: 28.9   Smokeless tobacco: Never  Vaping Use   Vaping Use: Never used  Substance and Sexual Activity   Alcohol use: Yes    Alcohol/week: 2.0 standard drinks    Types: 2 Shots of liquor per week    Comment: occ   Drug use: No   Sexual activity: Not Currently    Partners: Female  Other Topics Concern   Not on file  Social History Narrative   Married 42 years in 2015. No kids (mumps at age 41)      Retired from [employer] in Engineer, technical sales.  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      Hobbies: poker, movies and tv, staying active      Right handed   2 level home with steps he uses   Social Determinants of Radio broadcast assistant Strain: Low Risk    Difficulty of Paying Living Expenses: Not hard at all  Food Insecurity: No Food Insecurity   Worried About Charity fundraiser in the Last Year: Never true   Arboriculturist in the Last Year: Never true  Transportation Needs: No Transportation Needs   Lack of Transportation (Medical): No   Lack of Transportation (Non-Medical): No  Physical Activity: Inactive   Days of Exercise per Week: 0 days   Minutes of Exercise per Session: 0 min  Stress: No Stress Concern Present   Feeling of Stress : Only a little  Social Connections: Moderately Integrated   Frequency of Communication with Friends and Family: More than three times a week   Frequency of Social Gatherings with Friends and Family: More than three times a week   Attends Religious Services: Never   Marine scientist or Organizations:  Yes   Attends Archivist Meetings: 1 to 4 times per year   Marital  Status: Married    Tobacco Counseling Counseling given: Not Answered   Clinical Intake:  Pre-visit preparation completed: Yes  Pain : 0-10 Pain Score: 5  Pain Type: Chronic pain Pain Location: Knee Pain Orientation: Left Pain Descriptors / Indicators: Burning Pain Onset: 1 to 4 weeks ago Pain Frequency: Several days a week     BMI - recorded: 33.61 Nutritional Status: BMI > 30  Obese Nutritional Risks: None Diabetes: No  How often do you need to have someone help you when you read instructions, pamphlets, or other written materials from your doctor or pharmacy?: 1 - Never  Diabetic?No  Interpreter Needed?: No  Information entered by :: Charlott Rakes, LPN   Activities of Daily Living In your present state of health, do you have any difficulty performing the following activities: 05/22/2021  Hearing? Y  Comment wears hearing aids  Vision? N  Difficulty concentrating or making decisions? N  Walking or climbing stairs? Y  Dressing or bathing? N  Doing errands, shopping? N  Preparing Food and eating ? N  Using the Toilet? N  In the past six months, have you accidently leaked urine? Y  Comment wears pad  Do you have problems with loss of bowel control? N  Managing your Medications? N  Managing your Finances? N  Housekeeping or managing your Housekeeping? N  Some recent data might be hidden    Patient Care Team: [name], MD as PCP - General (Family Medicine) [name], MD as PCP - Cardiology (Cardiology) Gerda Diss, DO as Consulting Physician (Sports Medicine) Cameron Sprang, MD as Consulting Physician (Neurology) Chesley Mires, MD as Consulting Physician (Pulmonary Disease) Lyndal Pulley, DO as Consulting Physician (Sports Medicine) Tiajuana Amass, MD as Consulting Physician (Allergy and Immunology) Madelin Rear, [facility] (Pharmacist) Madelin Rear, [facility] as  Pharmacist (Pharmacist)  Indicate any recent Medical Services you may have received from other than [facility] providers in the past year (date may be approximate).     Assessment:   This is a routine wellness examination for Joshua [name].  Hearing/Vision screen Hearing Screening - Comments:: Pt wears hearing aids  Vision Screening - Comments:: Pt follows up with Dr Prudencio Burly for annual eye exams   Dietary issues and exercise activities discussed: Current Exercise Habits: The patient does not participate in regular exercise at present   Goals Addressed             This Visit's Progress    Patient Stated       Stay healthy and mentally stable        Depression Screen PHQ 2/9 Scores 05/22/2021 05/13/2021 02/06/2021 11/04/2020 08/21/2020 07/29/2020 05/06/2020  PHQ - 2 Score 1 0 2 1 4  0 0  PHQ- 9 Score 1 2 3 4 10 3  -    Fall Risk Fall Risk  05/22/2021 05/13/2021 01/08/2021 11/04/2020 08/21/2020  Falls in the past year? 1 1 1 1 1   Comment - - - - -  Number falls in past yr: 1 1 1 1 1   Injury with Fall? 0 1 1 1 1   Comment wrist injury right bruise - - - -  Risk for fall due to : Impaired vision;History of fall(s) - - History of fall(s) History of fall(s);Impaired balance/gait;Impaired mobility  Risk for fall due to: Comment - - - - -  Follow up Falls prevention discussed - - Falls evaluation completed -    FALL RISK PREVENTION PERTAINING TO THE HOME:  Any stairs in or around the  home? Yes  If so, are there any without handrails? No  Home free of loose throw rugs in walkways, pet beds, electrical cords, etc? Yes  Adequate lighting in your home to reduce risk of falls? Yes   ASSISTIVE DEVICES UTILIZED TO PREVENT FALLS:  Life alert? No  Use of a cane, walker or w/c? No  Grab bars in the bathroom? Yes  Shower chair or bench in shower? Yes  Elevated toilet seat or a handicapped toilet? No   TIMED UP AND GO:  Was the test performed? No .   Cognitive Function: MMSE - Mini Mental State Exam  08/05/2017 05/27/2016  Not completed: (No Data) (No Data)   Montreal Cognitive Assessment  09/25/2014  Visuospatial/ Executive (0/5) 5  Naming (0/3) 3  Attention: Read list of digits (0/2) 2  Attention: Read list of letters (0/1) 1  Attention: Serial 7 subtraction starting at 100 (0/3) 3  Language: Repeat phrase (0/2) 2  Language : Fluency (0/1) 1  Abstraction (0/2) 2  Delayed Recall (0/5) 3  Orientation (0/6) 6  Total 28  Adjusted Score (based on education) 28   6CIT Screen 05/22/2021 05/06/2020 02/06/2019  What Year? 0 points 0 points 0 points  What month? 0 points 0 points 0 points  What time? 0 points - 0 points  Count back from 20 0 points 4 points 0 points  Months in reverse 0 points 0 points 0 points  Repeat phrase 0 points 2 points 0 points  Total Score 0 - 0    Immunizations Immunization History  Administered Date(s) Administered   Fluad Quad(high Dose 65+) 11/21/2018, 12/11/2020   Influenza Split 01/08/2012, 11/12/2014   Influenza Whole 12/08/2007, 12/26/2008, 12/22/2010   Influenza, High Dose Seasonal PF 11/16/2015, 01/02/2016, 01/01/2017, 01/06/2018, 02/01/2019, 11/24/2019, 01/23/2020, 01/21/2021   Influenza,inj,Quad PF,6+ Mos 11/30/2012   Influenza-Unspecified 01/12/2014, 12/21/2017   PFIZER Comirnaty(Gray Top)Covid-19 Tri-Sucrose Vaccine 07/29/2020   PFIZER(Purple Top)SARS-COV-2 Vaccination 04/28/2019, 05/23/2019, 12/21/2019, 01/23/2020   Pfizer Covid-19 Vaccine Bivalent Booster 32yrs & up 12/11/2020   Pneumococcal Conjugate-13 05/31/2014   Pneumococcal Polysaccharide-23 06/04/2006, 07/15/2012, 01/02/2016, 12/31/2016, 01/23/2020, 01/21/2021   Td 04/23/2005, 05/13/2021   Tdap 02/18/2016   Zoster Recombinat (Shingrix) 01/31/2018, 04/02/2018   Zoster, Live 01/08/2012    TDAP status: Up to date  Flu Vaccine status: Up to date  Pneumococcal vaccine status: Up to date  Covid-19 vaccine status: Completed vaccines  Qualifies for Shingles Vaccine? Yes   Zostavax  completed Yes   Shingrix Completed?: Yes  Screening Tests Health Maintenance  Topic Date Due   TETANUS/TDAP  05/14/2031   Pneumonia Vaccine 36+ Years old  Completed   INFLUENZA VACCINE  Completed   COVID-19 Vaccine  Completed   Hepatitis C Screening  Completed   Zoster Vaccines- Shingrix  Completed   HPV VACCINES  Aged Out   COLONOSCOPY (Pts 45-61yrs Insurance coverage will need to be confirmed)  Discontinued    Health Maintenance  There are no preventive care reminders to display for this patient.  Colorectal cancer screening: No longer required.    Additional Screening:  Hepatitis C Screening: Completed 09/04/19  Vision Screening: Recommended annual ophthalmology exams for early detection of glaucoma and other disorders of the eye. Is the patient up to date with their annual eye exam?  Yes  Who is the provider or what is the name of the office in which the patient attends annual eye exams? Dr Prudencio Burly  If pt is not established with a provider, would they like to  be referred to a provider to establish care? No .   Dental Screening: Recommended annual dental exams for proper oral hygiene  Community Resource Referral / Chronic Care Management: CRR required this visit?  No   CCM required this visit?  No      Plan:     I have personally reviewed and noted the following in the patients chart:   Medical and social history Use of alcohol, tobacco or illicit drugs  Current medications and supplements including opioid prescriptions. Patient is not currently taking opioid prescriptions. Functional ability and status Nutritional status Physical activity Advanced directives List of other physicians Hospitalizations, surgeries, and ER visits in previous 12 months Vitals Screenings to include cognitive, depression, and falls Referrals and appointments  In addition, I have reviewed and discussed with patient certain preventive protocols, quality metrics, and best practice  recommendations. A written personalized care plan for preventive services as well as general preventive health recommendations were provided to patient.     Willette Brace, LPN   07/22/800   Nurse Notes: None

## 2021-05-22 NOTE — Patient Instructions (Signed)
Mr. Joshua [name] , Thank you for taking time to come for your Medicare Wellness Visit. I appreciate your ongoing commitment to your health goals. Please review the following plan we discussed and let me know if I can assist you in the future.   Screening recommendations/referrals: Colonoscopy: No longer required  Recommended yearly ophthalmology/optometry visit for glaucoma screening and checkup Recommended yearly dental visit for hygiene and checkup  Vaccinations: Influenza vaccine: Done 01/21/21 repeat every year  Pneumococcal vaccine: Up to date Tdap vaccine: Done 05/13/21 repeat every 10 years  Shingles vaccine: Completed 01/31/18, 04/02/18   Covid-19: Completed 2/5, 3/2, 9/30, 02/11/20 & 5/9, 12/11/20  Advanced directives: Copies in chart  Conditions/risks identified: stay healthy, active and mentally stable   Next appointment: Follow up in one year for your annual wellness visit.   Preventive Care 79 Years and Older, Male Preventive care refers to lifestyle choices and visits with your health care provider that can promote health and wellness. What does preventive care include? A yearly physical exam. This is also called an annual well check. Dental exams once or twice a year. Routine eye exams. Ask your health care provider how often you should have your eyes checked. Personal lifestyle choices, including: Daily care of your teeth and gums. Regular physical activity. Eating a healthy diet. Avoiding tobacco and drug use. Limiting alcohol use. Practicing safe sex. Taking low doses of aspirin every day. Taking vitamin and mineral supplements as recommended by your health care provider. What happens during an annual well check? The services and screenings done by your health care provider during your annual well check will depend on your age, overall health, lifestyle risk factors, and family history of disease. Counseling  Your health care provider may ask you questions about  your: Alcohol use. Tobacco use. Drug use. Emotional well-being. Home and relationship well-being. Sexual activity. Eating habits. History of falls. Memory and ability to understand (cognition). Work and work Statistician. Screening  You may have the following tests or measurements: Height, weight, and BMI. Blood pressure. Lipid and cholesterol levels. These may be checked every 5 years, or more frequently if you are over 60 years old. Skin check. Lung cancer screening. You may have this screening every year starting at age 38 if you have a 30-pack-year history of smoking and currently smoke or have quit within the past 15 years. Fecal occult blood test (FOBT) of the stool. You may have this test every year starting at age 62. Flexible sigmoidoscopy or colonoscopy. You may have a sigmoidoscopy every 5 years or a colonoscopy every 10 years starting at age 80. Prostate cancer screening. Recommendations will vary depending on your family history and other risks. Hepatitis C blood test. Hepatitis B blood test. Sexually transmitted disease (STD) testing. Diabetes screening. This is done by checking your blood sugar (glucose) after you have not eaten for a while (fasting). You may have this done every 1-3 years. Abdominal aortic aneurysm (AAA) screening. You may need this if you are a current or former smoker. Osteoporosis. You may be screened starting at age 25 if you are at high risk. Talk with your health care provider about your test results, treatment options, and if necessary, the need for more tests. Vaccines  Your health care provider may recommend certain vaccines, such as: Influenza vaccine. This is recommended every year. Tetanus, diphtheria, and acellular pertussis (Tdap, Td) vaccine. You may need a Td booster every 10 years. Zoster vaccine. You may need this after age 23. Pneumococcal 13-valent  conjugate (PCV13) vaccine. One dose is recommended after age 7. Pneumococcal  polysaccharide (PPSV23) vaccine. One dose is recommended after age 5. Talk to your health care provider about which screenings and vaccines you need and how often you need them. This information is not intended to replace advice given to you by your health care provider. Make sure you discuss any questions you have with your health care provider. Document Released: 04/05/2015 Document Revised: 11/27/2015 Document Reviewed: 01/08/2015 Elsevier Interactive Patient Education  2017 [employer] Prevention in the Home Falls can cause injuries. They can happen to people of all ages. There are many things you can do to make your home safe and to help prevent falls. What can I do on the outside of my home? Regularly fix the edges of walkways and driveways and fix any cracks. Remove anything that might make you trip as you walk through a door, such as a raised step or threshold. Trim any bushes or trees on the path to your home. Use bright outdoor lighting. Clear any walking paths of anything that might make someone trip, such as rocks or tools. Regularly check to see if handrails are loose or broken. Make sure that both sides of any steps have handrails. Any raised decks and porches should have guardrails on the edges. Have any leaves, snow, or ice cleared regularly. Use sand or salt on walking paths during winter. Clean up any spills in your garage right away. This includes oil or grease spills. What can I do in the bathroom? Use night lights. Install grab bars by the toilet and in the tub and shower. Do not use towel bars as grab bars. Use non-skid mats or decals in the tub or shower. If you need to sit down in the shower, use a plastic, non-slip stool. Keep the floor dry. Clean up any water that spills on the floor as soon as it happens. Remove soap buildup in the tub or shower regularly. Attach bath mats securely with double-sided non-slip rug tape. Do not have throw rugs and other  things on the floor that can make you trip. What can I do in the bedroom? Use night lights. Make sure that you have a light by your bed that is easy to reach. Do not use any sheets or blankets that are too big for your bed. They should not hang down onto the floor. Have a firm chair that has side arms. You can use this for support while you get dressed. Do not have throw rugs and other things on the floor that can make you trip. What can I do in the kitchen? Clean up any spills right away. Avoid walking on wet floors. Keep items that you use a lot in easy-to-reach places. If you need to reach something above you, use a strong step stool that has a grab bar. Keep electrical cords out of the way. Do not use floor polish or wax that makes floors slippery. If you must use wax, use non-skid floor wax. Do not have throw rugs and other things on the floor that can make you trip. What can I do with my stairs? Do not leave any items on the stairs. Make sure that there are handrails on both sides of the stairs and use them. Fix handrails that are broken or loose. Make sure that handrails are as long as the stairways. Check any carpeting to make sure that it is firmly attached to the stairs. Fix any carpet that  is loose or worn. Avoid having throw rugs at the top or bottom of the stairs. If you do have throw rugs, attach them to the floor with carpet tape. Make sure that you have a light switch at the top of the stairs and the bottom of the stairs. If you do not have them, ask someone to add them for you. What else can I do to help prevent falls? Wear shoes that: Do not have high heels. Have rubber bottoms. Are comfortable and fit you well. Are closed at the toe. Do not wear sandals. If you use a stepladder: Make sure that it is fully opened. Do not climb a closed stepladder. Make sure that both sides of the stepladder are locked into place. Ask someone to hold it for you, if possible. Clearly  mark and make sure that you can see: Any grab bars or handrails. First and last steps. Where the edge of each step is. Use tools that help you move around (mobility aids) if they are needed. These include: Canes. Walkers. Scooters. Crutches. Turn on the lights when you go into a dark area. Replace any light bulbs as soon as they burn out. Set up your furniture so you have a clear path. Avoid moving your furniture around. If any of your floors are uneven, fix them. If there are any pets around you, be aware of where they are. Review your medicines with your doctor. Some medicines can make you feel dizzy. This can increase your chance of falling. Ask your doctor what other things that you can do to help prevent falls. This information is not intended to replace advice given to you by your health care provider. Make sure you discuss any questions you have with your health care provider. Document Released: 01/03/2009 Document Revised: 08/15/2015 Document Reviewed: 04/13/2014 Elsevier Interactive Patient Education  2017 [employer].

## 2021-05-24 ENCOUNTER — Ambulatory Visit
Admission: RE | Admit: 2021-05-24 | Discharge: 2021-05-24 | Disposition: A | Discharge status: Home / Self Care | Payer: PPO | Source: Ambulatory Visit | Attending: Family Medicine | Admitting: Family Medicine

## 2021-05-24 ENCOUNTER — Other Ambulatory Visit: Payer: Self-pay | Admitting: Neurology

## 2021-05-24 ENCOUNTER — Other Ambulatory Visit: Payer: Self-pay

## 2021-05-24 DIAGNOSIS — M545 Low back pain, unspecified: Secondary | ICD-10-CM | POA: Diagnosis not present

## 2021-05-24 DIAGNOSIS — M5136 Other intervertebral disc degeneration, lumbar region: Secondary | ICD-10-CM

## 2021-05-26 ENCOUNTER — Other Ambulatory Visit: Payer: Self-pay

## 2021-05-26 ENCOUNTER — Ambulatory Visit: Payer: PPO | Admitting: Internal Medicine

## 2021-05-26 ENCOUNTER — Encounter: Payer: Self-pay | Admitting: Internal Medicine

## 2021-05-26 VITALS — BP 110/62 | HR 82 | Ht 66.0 in | Wt 207.6 lb

## 2021-05-26 DIAGNOSIS — T466X5A Adverse effect of antihyperlipidemic and antiarteriosclerotic drugs, initial encounter: Secondary | ICD-10-CM

## 2021-05-26 DIAGNOSIS — E785 Hyperlipidemia, unspecified: Secondary | ICD-10-CM

## 2021-05-26 DIAGNOSIS — M791 Myalgia, unspecified site: Secondary | ICD-10-CM | POA: Diagnosis not present

## 2021-05-26 DIAGNOSIS — I25810 Atherosclerosis of coronary artery bypass graft(s) without angina pectoris: Secondary | ICD-10-CM | POA: Diagnosis not present

## 2021-05-26 DIAGNOSIS — T466X5D Adverse effect of antihyperlipidemic and antiarteriosclerotic drugs, subsequent encounter: Secondary | ICD-10-CM | POA: Diagnosis not present

## 2021-05-26 NOTE — Patient Instructions (Signed)
Medication Instructions:  ?Your physician recommends that you continue on your current medications as directed. Please refer to the Current Medication list given to you today. ? ?*If you need a refill on your cardiac medications before your next appointment, please call your pharmacy* ? ? ?Lab Work: ?FASTING lab work to check cholesterol in 1 year  ? ?If you have labs (blood work) drawn today and your tests are completely normal, you will receive your results only by: ?MyChart Message (if you have MyChart) OR ?A paper copy in the mail ?If you have any lab test that is abnormal or we need to change your treatment, we will call you to review the results. ? ? ?Follow-Up: ?At [facility], you and your health needs are our priority.  As part of our continuing mission to provide you with exceptional heart care, we have created designated Provider Care Teams.  These Care Teams include your primary Cardiologist (physician) and Advanced Practice Providers (APPs -  Physician Assistants and Nurse Practitioners) who all work together to provide you with the care you need, when you need it. ? ?We recommend signing up for the patient portal called "MyChart".  Sign up information is provided on this After Visit Summary.  MyChart is used to connect with patients for Virtual Visits (Telemedicine).  Patients are able to view lab/test results, encounter notes, upcoming appointments, etc.  Non-urgent messages can be sent to your provider as well.   ?To learn more about what you can do with MyChart, go to [email or web address].   ? ?Your next appointment:   ? 1 year with Dr. Debara Pickett -- lipid clinic ?

## 2021-05-26 NOTE — Progress Notes (Signed)
LIPID CLINIC NOTE  Chief Complaint:  Follow-up  Primary Care Physician: [name], MD  Primary Cardiologist:  [name], MD  HPI:  Joshua [name] is a 81 y.o. male with a history of 5 vessel bypass more than 20 years ago and recent symptoms of unstable angina with PCI to the origin to the proximal graft lesion of the circumflex in January 2021.  He also has a history of dyslipidemia, hypertension, obstructive sleep apnea, prostate cancer and bladder cancer in the past.  He has been intolerant to numerous statins and had been maintained on Repatha with significant reduction in his lipids with LDL particularly into the 80s in the past however noted over the 2 years that he was taking the medication that he had side effects which initially lasted for 24 to 48 hours then up to 72 to 96 hours after each injection.  He said it was getting to the point where he felt it was intolerable and then was advised to discontinue the medicine.  Currently on simvastatin 20 mg daily which she seems to be able to tolerate, however her lipid profile 2 months ago showed total cholesterol 215, triglycerides 240, HDL 38 and LDL 134.  He remains still well above target LDL less than 70.  This visit today discussed options to see if we can get him to target.  04/24/2020   Mr. Joshua [name] returns today for follow-up.  He is actually doing very well with recent changes in his lipid management.  He is tolerating addition of ezetimibe.  Total cholesterol now is 155, HDL 41, LDL 84 (decreased from 134) and triglycerides 176.  His target LDL is less than 70 and he is nearing that.  We discussed other possible options.  He says he is looking forward to becoming more active in the near term after having some work possibly done on his orthopedic issues.  Hopefully this will help improve his numbers further.  Since he cannot tolerate the antibody PCSK9 inhibitors, we also discussed the possibility of inclisiran.  This is  now FDA approved but has not yet made it into patients in our area.  Cost may be an issue however we are still enrolling patients in the [place] trial and he may be a candidate for that.  11/01/2020  Mr. Joshua [name] continues to do well.  His lipids have been fairly stable if not slightly improved.  Total cholesterol now 161, HDL 50, triglycerides 147 and LDL 85.  He is on simvastatin and ezetimibe.  He cannot tolerate other statins and had an antibody reaction to the PCSK9 inhibitors causing immune/flulike symptoms.  I suspect he will not likely tolerate Praluent for similar mechanism.  Inclisiran would likely be too costly at this point and were having difficulty getting into patients.  His options are otherwise limited for further reduction to target LDL less than 70.  He has been quite stable.  He continues to have problems with his knee which limits his activity.  12/20/2020  Mr. Joshua [name] is seen today for follow-up.  He had some concerns about his blood pressure, specifically he has stopped his simvastatin.  I told him that this was not a blood pressure medication in fact his blood pressure is excellent today 128/72.  The simvastatin was stopped due to side effects including myalgias which did go away significantly after discontinuing it.  He remains on low-dose ezetimibe.  Cholesterol will undoubtedly increase.  He unfortunate did not qualify for [place]-4 due to the  fact that he had not had a prior cardiovascular event although he has had bypass.  I did discuss with him the fact that he could potentially get commercially available inclisiran, now marketed as Leqvio.  He does have Medicare however health team advantage plan and does not have supplemental coverage and cost therefore may be untenable for him.  05/26/2021  Mr. Joshua [name] was seen today in follow-up.  He is done very well on he went with marked reduction in his lipids.  Total cholesterol 129, triglycerides 149, HDL 52 and LDL 48.  He is tolerating  this without any issues.  He was able to get a grant and therefore the medicine is covered.  PMHx:  Past Medical History:  Diagnosis Date   Acute medial meniscal tear    Anemia 2015   Arthritis    "middle finger right hand; right knee; neck" (02/06/2014)   Asthma    seasonal   Bladder cancer ([place]) 04/2010   "cauterized during prostate OR"   CAD (coronary artery disease)    CABG 2001   Diverticulosis    DJD (degenerative joint disease)    BACK   GERD (gastroesophageal reflux disease)    H/O inguinal hernia repair 12/2018   History of gout    Hyperlipidemia    Hypertension    IBS (irritable bowel syndrome)    Obesity    OSA (obstructive sleep apnea)    USES CPAP    Pancreatitis ~ 1980   Peripheral vascular disease ([place])    Pre-diabetes    Prostate cancer ([place]) 04/2010    Past Surgical History:  Procedure Laterality Date   ANTERIOR CERVICAL DECOMP/DISCECTOMY FUSION  05/26/2006   PLATE PLACED   BACK SURGERY     CARDIAC CATHETERIZATION  11/20/1999   CORONARY ARTERY BYPASS GRAFT  11/20/1999   SVG-RI1-RI2, SVG-OM, SVG-dRCA   CORONARY STENT INTERVENTION N/A 04/13/2019   Procedure: CORONARY STENT INTERVENTION;  Surgeon: Burnell Blanks, MD;  Location: [facility];  Service: Cardiovascular;  Laterality: N/A;   HERNIA REPAIR     INGUINAL HERNIA REPAIR Left 01/02/2019   Procedure: OPEN LEFT INGUINAL HERNIA REPAIR WITH MESH;  Surgeon: Johnathan Hausen, MD;  Location: [facility];  Service: General;  Laterality: Left;   KNEE ARTHROSCOPY Right 1992; 04/2008   LAPAROSCOPIC CHOLECYSTECTOMY  2007   LEFT HEART CATH AND CORS/GRAFTS ANGIOGRAPHY N/A 04/13/2019   Procedure: LEFT HEART CATH AND CORS/GRAFTS ANGIOGRAPHY;  Surgeon: Burnell Blanks, MD;  Location: [facility];  Service: Cardiovascular;  Laterality: N/A;   NASAL SEPTUM SURGERY Right 2007   ROBOT ASSISTED LAPAROSCOPIC RADICAL PROSTATECTOMY  04/2010   THYROIDECTOMY, PARTIAL  1980   TOTAL KNEE ARTHROPLASTY Left  06/18/2020   Procedure: LEFT TOTAL KNEE ARTHROPLASTY;  Surgeon: Melrose Nakayama, MD;  Location: [facility];  Service: Orthopedics;  Laterality: Left;    FAMHx:  Family History  Problem Relation Age of Onset   Heart disease Mother    Hyperlipidemia Mother    Heart disease Father    Hyperlipidemia Father    Diabetes Brother    Colon cancer Neg Hx    Pancreatic cancer Neg Hx    Esophageal cancer Neg Hx    Stomach cancer Neg Hx    Liver cancer Neg Hx     SOCHx:   reports that he quit smoking about 28 years ago. His smoking use included cigarettes. He has a 35.00 pack-year smoking history. He has never used smokeless tobacco. He reports current alcohol use  of about 2.0 standard drinks per week. He reports that he does not use drugs.  ALLERGIES:  Allergies  Allergen Reactions   Benadryl [Diphenhydramine] Other (See Comments)    Pt told not to take because of bypass surgery   Bromfed Nausea And Vomiting   Clarithromycin Other (See Comments)     gastritis   Codeine Other (See Comments)    Trouble breathing   Doxycycline Hyclate Nausea And Vomiting   Lexapro [Escitalopram]     Dizziness   Modafinil Other (See Comments)     anxiety-nervousness   Oxycodone-Acetaminophen Itching   Promethazine Hcl Other (See Comments)     fainting   Quinolones Nausea Only    Cipro "felt real bad"   Telithromycin Nausea And Vomiting   Tramadol Nausea Only   Hydrocodone-Acetaminophen Rash   Statins Other (See Comments)    Leg cramps and makes patient feel bad. Per pt tried multiple in years past   Sulfonamide Derivatives Rash    ROS: Pertinent items noted in HPI and remainder of comprehensive ROS otherwise negative.  HOME MEDS: Current Outpatient Medications on File Prior to Visit  Medication Sig Dispense Refill   acetaminophen (TYLENOL) 500 MG tablet Take 1,000 mg by mouth every 6 (six) hours as needed for moderate pain or headache.     albuterol (PROAIR HFA) 108 (90 BASE) MCG/ACT inhaler  Inhale 1-2 puffs into the lungs every 6 (six) hours as needed for wheezing or shortness of breath. 18 g 3   Alirocumab (PRALUENT) 150 MG/ML SOAJ INJECT 1 DOSE INTO THE SKIN EVERY 14 (FOURTEEN) DAYS. 2 mL 11   amLODipine (NORVASC) 5 MG tablet Take 1 tablet (5 mg total) by mouth daily. 90 tablet 3   aspirin EC 81 MG tablet Take 1 tablet (81 mg total) by mouth 2 (two) times daily after a meal. 30 tablet 11   busPIRone (BUSPAR) 5 MG tablet Take 1 tablet (5 mg total) by mouth 2 (two) times daily as needed (anxiety). 40 tablet 2   cholecalciferol (VITAMIN D3) 25 MCG (1000 UNIT) tablet Take 1,000 Units by mouth daily.     clotrimazole-betamethasone (LOTRISONE) cream Apply 1 application topically See admin instructions. Twice daily every 4 days     Cyanocobalamin (B-12) 2500 MCG TABS Take 1,250 mcg by mouth daily.     diazepam (VALIUM) 5 MG tablet Take 1 tablet (5 mg total) by mouth at bedtime as needed (sleep). 30 tablet 1   EPINEPHrine 0.3 mg/0.3 mL IJ SOAJ injection Inject 0.3 mg into the muscle as needed for anaphylaxis. for allergic reaction     ezetimibe (ZETIA) 10 MG tablet Take 10 mg by mouth daily.     FLOVENT HFA 110 MCG/ACT inhaler Inhale 1 puff into the lungs 2 (two) times daily.   5   fluticasone (FLONASE) 50 MCG/ACT nasal spray Place 1 spray into both nostrils 2 (two) times daily.      folic acid (FOLVITE) 209 MCG tablet Take 800 mcg by mouth daily.      gabapentin (NEURONTIN) 300 MG capsule Take 1 cap in AM, 1 cap at noon, 2 caps at bedtime (Patient taking differently: Take 300-600 mg by mouth See admin instructions. Take 300 mg in the morning, 300 mg at noon, and 600 mg at bedtime) 360 capsule 3   GEMTESA 75 MG TABS TAKE 1 TABLET BY MOUTH DAILY. 30 tablet 11   Glucos-Chond-Hyal Ac-Ca Fructo (MOVE FREE JOINT HEALTH ADVANCE PO) Take 1 tablet by mouth daily.  hydrochlorothiazide (HYDRODIURIL) 25 MG tablet TAKE 1 TABLET BY MOUTH EVERY DAY 90 tablet 2   ketoconazole (NIZORAL) 2 % cream Apply  1 application topically See admin instructions. Twice a day every other day     loratadine (CLARITIN) 10 MG tablet Take 10 mg by mouth at bedtime.      Miconazole Nitrate (LOTRIMIN AF DEODORANT POWDER) 2 % AERP Apply 1 spray topically daily as needed (jock itch).     nitroGLYCERIN (NITROSTAT) 0.4 MG SL tablet PLACE 1 TABLET UNDER THE TONGUE EVERY 5 (FIVE) MINUTES AS NEEDED. 75 tablet 1   nystatin-triamcinolone ointment (MYCOLOG) Apply 1 application topically See admin instructions. Twice daily every 4 days     pantoprazole (PROTONIX) 40 MG tablet TAKE 1 TABLET BY MOUTH EVERY DAY 90 tablet 3   polyethylene glycol powder (GLYCOLAX/MIRALAX) powder Take 17 g by mouth daily. 255 g 0   tiZANidine (ZANAFLEX) 4 MG tablet Take 1 tablet (4 mg total) by mouth every 6 (six) hours as needed for muscle spasms. 40 tablet 1   valsartan (DIOVAN) 320 MG tablet Take 1 tablet (320 mg total) by mouth daily. 90 tablet 3   Wheat Dextrin (BENEFIBER) POWD Take 1 Dose by mouth daily. 1 dose = 2 teaspoons     No current facility-administered medications on file prior to visit.    LABS/IMAGING: No results found. However, due to the size of the patient record, not all encounters were searched. Please check Results Review for a complete set of results. MR LUMBAR SPINE WO CONTRAST  Result Date: 05/24/2021 CLINICAL DATA:  Low back pain. EXAM: MRI LUMBAR SPINE WITHOUT CONTRAST TECHNIQUE: Multiplanar, multisequence MR imaging of the lumbar spine was performed. No intravenous contrast was administered. COMPARISON:  MRI lumbar spine dated July 01, 2017. Radiographs dated March 28, 2021 FINDINGS: Segmentation:  Standard. Alignment:  Approximately 6 mm anterolisthesis of L4. Vertebrae: No fracture, evidence of discitis, or bone lesion. Facet joint arthropathy with marrow edema about the facet joints, left worse than the right at L4-L5. Conus medullaris and cauda equina: Conus extends to the L1 level. Conus and cauda equina appear  normal. 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 Disc levels: T12-L1: No significant disc bulge. No neural foraminal stenosis. No central canal stenosis. L1-L2: No significant disc bulge. No neural foraminal stenosis. No central canal stenosis. Mild facet joint arthropathy. L2-L3: No significant disc bulge. No neural foraminal stenosis. No central canal stenosis. Bilateral facet joint arthropathy, right greater than the left. L3-L4: Mild circumferential disc protrusion and ligamentum flavum hypertrophy with narrowing of spinal canal. No significant neural foraminal narrowing. Moderate bilateral facet joint arthropathy. L4-L5: Anterolisthesis of L4. Circumferential disc protrusion and moderate ligamentum flavum hypertrophy with narrowing of spinal canal and narrowing of bilateral lateral recesses. Severe bilateral facet joint arthropathy. Moderate right and mild left neural foraminal narrowing. L5-S1: Ligamentum flavum hypertrophy with asymmetric left disc protrusion with narrowing of left lateral recess. Moderate bilateral facet joint arthropathy with mild right and moderate left neural foraminal narrowing. IMPRESSION: 1. 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 Bilateral neural foraminal narrowing at L4-L5 and L5-S1 secondary to advanced facet joint arthropathy. 2. 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 3.  Grade 1 anterolisthesis of L4, unchanged. 4. 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 Follow-up with annual abdominal sonogram is recommended. Electronically Signed   By: Keane Police D.O.   On: 05/24/2021 19:58    LIPID PANEL:    Component Value Date/Time   CHOL 129 05/13/2021 1101   CHOL 161 10/22/2020  0835   TRIG 149.0 05/13/2021 1101   HDL 51.60 05/13/2021 1101   HDL 50 10/22/2020 0835   CHOLHDL 3 05/13/2021 1101   VLDL 29.8 05/13/2021 1101   LDLCALC 48 05/13/2021 1101   LDLCALC 85 10/22/2020 0835   LDLCALC 84 02/23/2020 0933   LDLDIRECT 115.0 06/04/2016 0919    WEIGHTS: Wt Readings from Last 3 Encounters:  05/26/21 207 lb 9.6 oz (94.2 kg)  05/13/21 202 lb (91.6 kg)  05/08/21 203 lb (92.1 kg)    VITALS: BP 110/62    Pulse 82    Ht '5\' 6"'$  (1.676 m)    Wt 207 lb 9.6 oz (94.2 kg)    SpO2 98%    BMI 33.51 kg/m   EXAM: Deferred  EKG: Deferred  ASSESSMENT: Mixed dyslipidemia, goal LDL less than 70 Coronary artery disease status post 5 vessel CABG (2000) Recent unstable angina and PCI January 2021 Hypertension Obstructive sleep apnea Statin intolerance-myalgias Repatha intolerance  PLAN: 1.   Mr. Joshua [name] has had an excellent response to Repatha.  He is now at target LDL less than 70.  He is tolerating the medicine well and it is cost effective currently for him in fact he has no co-pay.  We will continue current therapies.  Follow-up annually or sooner as necessary.  Pixie Casino, MD, [facility], [place] Director of the [facility] Lipid [facility] &  Cardiovascular [facility] Diplomate of the American Board of Clinical Lipidology Attending Cardiologist  Direct Dial: [phone]   Fax: [phone]  Website:  [email or web address]  Nadean Corwin Joshua [name] 05/26/2021, 8:20 AM

## 2021-05-28 ENCOUNTER — Other Ambulatory Visit: Payer: Self-pay

## 2021-05-28 ENCOUNTER — Ambulatory Visit: Payer: PPO | Admitting: Neurology

## 2021-05-28 ENCOUNTER — Encounter: Payer: Self-pay | Admitting: Neurology

## 2021-05-28 ENCOUNTER — Ambulatory Visit (HOSPITAL_BASED_OUTPATIENT_CLINIC_OR_DEPARTMENT_OTHER): Payer: Self-pay | Admitting: Physical Therapy

## 2021-05-28 VITALS — BP 131/70 | HR 87 | Ht 66.0 in | Wt 207.8 lb

## 2021-05-28 DIAGNOSIS — J3081 Allergic rhinitis due to animal (cat) (dog) hair and dander: Secondary | ICD-10-CM | POA: Diagnosis not present

## 2021-05-28 DIAGNOSIS — G609 Hereditary and idiopathic neuropathy, unspecified: Secondary | ICD-10-CM | POA: Diagnosis not present

## 2021-05-28 DIAGNOSIS — J301 Allergic rhinitis due to pollen: Secondary | ICD-10-CM | POA: Diagnosis not present

## 2021-05-28 DIAGNOSIS — J3089 Other allergic rhinitis: Secondary | ICD-10-CM | POA: Diagnosis not present

## 2021-05-28 MED ORDER — GABAPENTIN 300 MG PO CAPS: ORAL_CAPSULE | ORAL | 3 refills | Status: DC
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
## 2021-05-28 NOTE — Patient Instructions (Signed)
Always good to see you. Continue Gabapentin '300mg'$  in AM, '300mg'$  at noon, '600mg'$  every night. Follow-up in 1 year, call for any changes. ? ? ?

## 2021-05-29 ENCOUNTER — Encounter (HOSPITAL_BASED_OUTPATIENT_CLINIC_OR_DEPARTMENT_OTHER): Payer: Self-pay | Admitting: Physical Therapy

## 2021-05-29 ENCOUNTER — Ambulatory Visit (HOSPITAL_BASED_OUTPATIENT_CLINIC_OR_DEPARTMENT_OTHER): Payer: PPO | Attending: Family Medicine | Admitting: Physical Therapy

## 2021-05-29 DIAGNOSIS — M25562 Pain in left knee: Secondary | ICD-10-CM | POA: Diagnosis not present

## 2021-05-29 DIAGNOSIS — M545 Low back pain, unspecified: Secondary | ICD-10-CM | POA: Diagnosis not present

## 2021-05-29 DIAGNOSIS — G8929 Other chronic pain: Secondary | ICD-10-CM | POA: Diagnosis not present

## 2021-05-29 DIAGNOSIS — M6281 Muscle weakness (generalized): Secondary | ICD-10-CM | POA: Insufficient documentation

## 2021-05-29 NOTE — Therapy (Signed)
OUTPATIENT PHYSICAL THERAPY TREATMENT NOTE   Patient Name: Joshua [name] MRN: [id number] DOB:13-Mar-1941, 81 y.o., male Today's Date: 05/29/2021  PCP: [name], MD REFERRING PROVIDER: [name], MD   PT End of Session - 05/29/21 1450     Visit Number 3    Number of Visits 12    Date for PT Re-Evaluation 06/04/21    Progress Note Due on Visit --   05/21/2021   PT Start Time 1450    PT Stop Time 1528    PT Time Calculation (min) 38 min    Activity Tolerance Patient tolerated treatment well    Behavior During Therapy [facility] for tasks assessed/performed              Past Medical History:  Diagnosis Date   Acute medial meniscal tear    Anemia 2015   Arthritis    "middle finger right hand; right knee; neck" (02/06/2014)   Asthma    seasonal   Bladder cancer ([place]) 04/2010   "cauterized during prostate OR"   CAD (coronary artery disease)    CABG 2001   Diverticulosis    DJD (degenerative joint disease)    BACK   GERD (gastroesophageal reflux disease)    H/O inguinal hernia repair 12/2018   History of gout    Hyperlipidemia    Hypertension    IBS (irritable bowel syndrome)    Obesity    OSA (obstructive sleep apnea)    USES CPAP    Pancreatitis ~ 1980   Peripheral vascular disease ([place])    Pre-diabetes    Prostate cancer ([place]) 04/2010   Past Surgical History:  Procedure Laterality Date   ANTERIOR CERVICAL DECOMP/DISCECTOMY FUSION  05/26/2006   PLATE PLACED   BACK SURGERY     CARDIAC CATHETERIZATION  11/20/1999   CORONARY ARTERY BYPASS GRAFT  11/20/1999   SVG-RI1-RI2, SVG-OM, SVG-dRCA   CORONARY STENT INTERVENTION N/A 04/13/2019   Procedure: CORONARY STENT INTERVENTION;  Surgeon: Burnell Blanks, MD;  Location: [place] [facility];  Service: Cardiovascular;  Laterality: N/A;   HERNIA REPAIR     INGUINAL HERNIA REPAIR Left 01/02/2019   Procedure: OPEN LEFT INGUINAL HERNIA REPAIR WITH MESH;  Surgeon: Johnathan Hausen, MD;  Location: [facility];   Service: General;  Laterality: Left;   KNEE ARTHROSCOPY Right 1992; 04/2008   LAPAROSCOPIC CHOLECYSTECTOMY  2007   LEFT HEART CATH AND CORS/GRAFTS ANGIOGRAPHY N/A 04/13/2019   Procedure: LEFT HEART CATH AND CORS/GRAFTS ANGIOGRAPHY;  Surgeon: Burnell Blanks, MD;  Location: [place] [facility];  Service: Cardiovascular;  Laterality: N/A;   NASAL SEPTUM SURGERY Right 2007   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   ROBOT ASSISTED LAPAROSCOPIC RADICAL PROSTATECTOMY  04/2010   THYROIDECTOMY, PARTIAL  1980   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   Procedure: LEFT TOTAL KNEE ARTHROPLASTY;  Surgeon: Melrose Nakayama, MD;  Location: [facility];  Service: Orthopedics;  Laterality: Left;   Patient Active Problem List   Diagnosis Date Noted   Greater trochanteric bursitis of right hip 02/17/2021   Hyperactive bowel sounds 01/29/2021   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 ([place]) 09/25/2020   Primary osteoarthritis of left knee 06/18/2020   Pre-operative respiratory examination 06/05/2020   Coronary artery disease involving coronary bypass graft of native heart without angina pectoris 04/07/2020   Trigger thumb, left thumb 01/24/2020   Overactive bladder 11/24/2019   Left lateral epicondylitis 09/05/2019   Degenerative arthritis of left knee 08/08/2019  Educated about COVID-19 virus infection 04/20/2019   Unstable angina (HCC)    Greater trochanteric bursitis of left hip 10/27/2018   Degenerative disc disease, lumbar 10/27/2018   Abdominal muscle strain, initial encounter 06/16/2018   Aortic atherosclerosis ([place]) 05/05/2018   Hyperglycemia 10/07/2017   Weakness of left leg 06/21/2017   DJD (degenerative joint disease)    Arthritis    Neck pain 02/08/2017   Venous insufficiency 12/29/2016   Ganglion cyst of tendon sheath of right hand 12/29/2016   Avulsion fracture of metatarsal bone of right foot 11/25/2016   Gout 11/20/2016   Obesity, Class I, BMI 30-34.9 10/03/2015    Hereditary and idiopathic peripheral neuropathy 09/25/2014   Allergic rhinitis 05/31/2014   Fatigue 11/09/2013   CKD (chronic kidney disease), stage III ([place]) 10/23/2013   Dizziness 08/23/2013   Neuropathy ([place]) 08/21/2013   RLS (restless legs syndrome) 01/03/2013   Chronic bilateral low back pain with left-sided sciatica 10/09/2011   History of bladder cancer 04/01/2010   OSTEOARTHRITIS, LOWER LEG, LEFT 10/04/2009   Generalized abdominal pain 01/31/2009   ANEMIA, IRON DEFICIENCY 11/06/2008   TINNITUS, CHRONIC, 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   Essential hypertension 02/21/2007   OSA (obstructive sleep apnea) 11/02/2006   Hyperlipidemia 10/12/2006   Major depression in full remission ([place]) 10/12/2006   Coronary atherosclerosis 10/12/2006   Asthma 10/12/2006   GERD 10/12/2006   Irritable bowel syndrome 10/12/2006  REFERRING PROVIDER: Lyndal Pulley, DO   REFERRING DIAG: M54.50 (ICD-10-CM) - Lumbar spine pain       M17.12 (ICD-10-CM) - Primary osteoarthritis of left knee    THERAPY DIAG:  Chronic low back pain, unspecified back pain laterality, unspecified whether sciatica present   Left knee pain, unspecified chronicity   Muscle weakness (generalized)   ONSET DATE: Chronic pain / MD note 03/28/2021   SUBJECTIVE:                                                                                                                                                                                            SUBJECTIVE STATEMENT: Pt reports he slipped and fell 3 wks ago (observed with large dark bruise over Rt low back and flank).  He had MRI last weekend of his back.  "Dr wants me to get more shots". Pt reports his Lt knee continues to be painful.     PERTINENT HISTORY:  -Chronic Lumbar pain.  Spondylolisthesis at L4-L5 level and possible minimal anterolisthesis at L5-S1. Pt states he has an AAA though [place] stated the mild prominence does not meet the criteria for aneurysm; ectasia of infrarenal  aorta.  Chronic kidney disease. Hx of prostate  and bladder CA.  Pt recently had Covid which was dx'd on 1/13.           -PSHx:  L inguinal hernia repair in 2020, L TKA on 06/18/2020 ; CAD with CABG x5 on 11/20/1999 and Coronary stent placement on 04/13/2019 ;  Anterior cervical decomp/discectomy/fusion on 05/26/2006      PAIN:  Are you having pain? Yes NPRS scale: Lumbar:  5-6/10                         L Knee:  5/10 Pain location: both sides of lumbar spine > central lumbar Aggravating factors: standing, ambulation, sitting in a bad chair Relieving factors   PRECAUTIONS: Other: Lumbar spondylolisthesis, Hx of CABG and cervical fusion; aorta ectasia     OCCUPATION: Pt is retired   PLOF: Independent   PATIENT GOALS reduced pain, improved tolerance to activity, wants to be able to exercise     OBJECTIVE:    DIAGNOSTIC FINDINGS:  -Lumbar X ray:  Lumbar spondylosis with interval progression. There is first-degree spondylolisthesis at L4-L5 level. There is possible minimal anterolisthesis at L5-S1 level. ectasia of infrarenal aorta.   -X ray of knee in November:   IMPRESSION:  Uncomplicated left total knee prosthesis.   -Abdominal US:   1. 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 While the distal abdominal aorta is mildly prominent measuring 2.7 cm, by CT criteria, comparing to the more proximal normal aorta, the mild prominence does not meet the criteria for aneurysm. No follow-up imaging required.                           TODAY'S TREATMENT  05/29/21 Pt seen for aquatic therapy today.  Treatment took place in water 3.25-4 ft in depth at the [facility] pool. Temp of water was 96.  Pt entered/exited the pool via stairs with step to pattern, independently,  bilat rail.  Forward gait (without UE support)  Side stepping and retro gait. Ab set x 5 sec with push down of blue noodle Gastroc stretch x 20 x 2 each Hip abdct x 10 each leg, 2 sets High knee marching x 10, 2  sets Supine supported float x 5 min for decompression, with gentle swaying from therapist for side stretch    04/29/21 -Reviewed response to prior, current function, and pain level. -Pt performed:  Supine PPT 2x10 reps  Supine TrA contraction x 10 reps, no hold  Supine marching with PPT 2x10 reps  Supine heel slides with PPT 1x10 reps and 1x8 reps  Supine clams with PPT with RTB 2x10 reps  Seated HS stretch 2x20 sec bilat  Supine manual HS stretch 2x20-30 sec bilat -Assessed HS and piriformis flexibility:  -Pt has tightness in bilat HS L > R.  Pt didn't feel a stretch in R piriformis and felt a minimal stretch on L   -Pt received a HEP handout and was educated in correct form and appropriate frequency.       PATIENT EDUCATION:  Education details: POC, dx, objective findings, rationale of exercises, and aquatic therapy process.  PT answered Pt's questions.  Educated pt on the benefits and purpose of aquatic therapy including buoyancy and viscosity.  Person educated: Patient Education method: Explanation Education comprehension: verbalized understanding     HOME EXERCISE PROGRAM: Access Code: [email or web address] URL: [email or web address] Date: 04/29/2021 Prepared by: Ronny Flurry  Exercises Supine Posterior Pelvic Tilt - 2 x daily - 7  x weekly - 2 sets - 10 reps Supine March - 1-2 x daily - 7 x weekly - 1 sets - 10 reps Hooklying Clamshell with Resistance - 1 x daily - 4-5 x weekly - 1-2 sets - 10 reps Supine Transversus Abdominis Bracing - Hands on Stomach - 2 x daily - 7 x weekly - 1-2 sets - 10 reps Seated Hamstring Stretch - 2 x daily - 7 x weekly - 2 reps - 20-30 second hold      ASSESSMENT:   CLINICAL IMPRESSION:  Pt has had a long pause between therapy sessions due to limited availability with appointments in water.  He reported minimal relief of back and knee pain when exercising in the water up to chest level.  He did report some reduction of back pain with  supine supported float.  Goals are ongoing.    Objective impairments include decreased activity tolerance, decreased balance, decreased endurance, decreased mobility, difficulty walking, decreased ROM, decreased strength, hypomobility, impaired flexibility, and pain. These impairments are limiting patient from cleaning, community activity, shopping, and ambulation and standing activities . Personal factors including Time since onset of injury/illness/exacerbation and 1-2 comorbidities: CAD with CABG x5 on 11/20/1999 and Coronary stent placement on 04/13/2019. Chronic kidney disease  are also affecting patient's functional outcome.     REHAB POTENTIAL: Good   CLINICAL DECISION MAKING: Evolving/moderate complexity   EVALUATION COMPLEXITY: Moderate     GOALS:     SHORT TERM GOALS:   STG Name Target Date Goal status  1 Pt will be independent and compliant with HEP for improved pain, strength, and function.  Baseline:  05/14/2021 INITIAL  2 Pt will tolerate aquatic therapy without adverse effects for improved strength and tolerance to activity.  Baseline:  05/07/2021 INITIAL  3 Pt will report at least a 25% improvement in pain and sx's overall for improved daily mobility.  Baseline: 05/14/2021 INITIAL                                        LONG TERM GOALS:    LTG Name Target Date Goal status  1 Pt will demo improved strength to 5/5 MMT in bilat hips and L knee flexion for improved tolerance to activity and functional mobility.  Baseline: 06/04/2021 INITIAL  2 Pt will be able to stand > 20 mins without significant pain. Baseline: 06/04/2021 INITIAL  3 Pt will be able to perform extended community ambulation without significant lumbar and knee pain. Baseline: 06/04/2021 INITIAL  4 Pt will be independent with aquatic HEP in order to establish a long term HEP for improved tolerance with and performance of functional mobility including reduced pain with daily act's.  Baseline: 06/04/2021 INITIAL   5 Pt will report at least a 70% reduction in pain and increase in ease with performing daily act's and daily fxnl mobility.  Baseline: 06/04/2021 INITIAL                      PLAN: PT FREQUENCY: 2x/week   PT DURATION: 6 weeks   PLANNED INTERVENTIONS: Therapeutic exercises, Therapeutic activity, Neuro Muscular re-education, Balance training, Gait training, Patient/Family education, Joint mobilization, Stair training, Aquatic Therapy, Dry Needling, Spinal mobilization, Cryotherapy, Moist heat, Taping, and Manual therapy   PLAN FOR NEXT SESSION:  Assess response to Aquatic therapy.  Next [place] visit:  Add Nustep with bilat UE/LE and check for any  tenderness in glute or lumbar paraspinals and possible STM.

## 2021-06-02 NOTE — Progress Notes (Unsigned)
[name] Phone: [phone] Subjective:    I'm seeing this patient by the request  of:  [name], MD  CC:   [id number]  Joshua [name] is a 81 y.o. male coming in with complaint of lumbar spine pain. Here for MRI results. Patient has been doing PT since last visit on 05/08/2021. Patient states   MRI Lumbar  05/24/2021 IMPRESSION: 1. 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 Bilateral neural foraminal narrowing at L4-L5 and L5-S1 secondary to advanced facet joint arthropathy.   2. 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   3.  Grade 1 anterolisthesis of L4, unchanged.   4. 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 Follow-up with annual abdominal sonogram is recommended.      Past Medical History:  Diagnosis Date   Acute medial meniscal tear    Anemia 2015   Arthritis    "middle finger right hand; right knee; neck" (02/06/2014)   Asthma    seasonal   Bladder cancer ([place]) 04/2010   "cauterized during prostate OR"   CAD (coronary artery disease)    CABG 2001   Diverticulosis    DJD (degenerative joint disease)    BACK   GERD (gastroesophageal reflux disease)    H/O inguinal hernia repair 12/2018   History of gout    Hyperlipidemia    Hypertension    IBS (irritable bowel syndrome)    Obesity    OSA (obstructive sleep apnea)    USES CPAP    Pancreatitis ~ 1980   Peripheral vascular disease ([place])    Pre-diabetes    Prostate cancer ([place]) 04/2010   Past Surgical History:  Procedure Laterality Date   ANTERIOR CERVICAL DECOMP/DISCECTOMY FUSION  05/26/2006   PLATE PLACED   BACK SURGERY     CARDIAC CATHETERIZATION  11/20/1999   CORONARY ARTERY BYPASS GRAFT   11/20/1999   SVG-RI1-RI2, SVG-OM, SVG-dRCA   CORONARY STENT INTERVENTION N/A 04/13/2019   Procedure: CORONARY STENT INTERVENTION;  Surgeon: Burnell Blanks, MD;  Location: [facility];  Service: Cardiovascular;  Laterality: N/A;   HERNIA REPAIR     INGUINAL HERNIA REPAIR Left 01/02/2019   Procedure: OPEN LEFT INGUINAL HERNIA REPAIR WITH MESH;  Surgeon: Johnathan Hausen, MD;  [facility];  Service: General;  Laterality: Left;   KNEE ARTHROSCOPY Right 1992; 04/2008   LAPAROSCOPIC CHOLECYSTECTOMY  2007   LEFT HEART CATH AND CORS/GRAFTS ANGIOGRAPHY N/A 04/13/2019   Procedure: LEFT HEART CATH AND CORS/GRAFTS ANGIOGRAPHY;  Surgeon: Burnell Blanks, MD;  Location: [facility];  Service: Cardiovascular;  Laterality: N/A;   NASAL SEPTUM SURGERY Right 2007   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   ROBOT ASSISTED LAPAROSCOPIC RADICAL PROSTATECTOMY  04/2010   THYROIDECTOMY, PARTIAL  1980   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   Procedure: LEFT TOTAL KNEE ARTHROPLASTY;  Surgeon: Melrose Nakayama, MD;  [facility];  Service: Orthopedics;  Laterality: Left;   Social History   Socioeconomic History   Marital status: Married    Spouse name: Not on file   Number of children: Not on file   Years of education: Not on file   Highest education level: Not on file  Occupational History   Occupation: retired  Employer: RETIRED  Tobacco Use   Smoking status: Former    Packs/day: 1.00    Years: 35.00    Pack years: 35.00    Types: Cigarettes    Quit date: 06/16/1992    Years since quitting: 28.9   Smokeless tobacco: Never  Vaping Use   Vaping Use: Never used  Substance and Sexual Activity   Alcohol use: Yes    Alcohol/week: 2.0 standard drinks    Types: 2 Shots of liquor per week    Comment: occ   Drug use: No   Sexual activity: Not Currently    Partners: Female  Other Topics Concern   Not on file  Social History Narrative   Married 42 years in 2015. No kids  (mumps at age 68)      Retired from [employer] in Engineer, technical sales.  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      Hobbies: poker, movies and tv, staying active      Right handed   2 level home with steps he uses   Social Determinants of Radio broadcast assistant Strain: Low Risk    Difficulty of Paying Living Expenses: Not hard at all  Food Insecurity: No Food Insecurity   Worried About Charity fundraiser in the Last Year: Never true   Arboriculturist in the Last Year: Never true  Transportation Needs: No Transportation Needs   Lack of Transportation (Medical): No   Lack of Transportation (Non-Medical): No  Physical Activity: Inactive   Days of Exercise per Week: 0 days   Minutes of Exercise per Session: 0 min  Stress: No Stress Concern Present   Feeling of Stress : Only a little  Social Connections: Moderately Integrated   Frequency of Communication with Friends and Family: More than three times a week   Frequency of Social Gatherings with Friends and Family: More than three times a week   Attends Religious Services: Never   Marine scientist or Organizations: Yes   Attends Music therapist: 1 to 4 times per year   Marital Status: Married   Allergies  Allergen Reactions   Benadryl [Diphenhydramine] Other (See Comments)    Pt told not to take because of bypass surgery   Bromfed Nausea And Vomiting   Clarithromycin Other (See Comments)     gastritis   Codeine Other (See Comments)    Trouble breathing   Doxycycline Hyclate Nausea And Vomiting   Lexapro [Escitalopram]     Dizziness   Modafinil Other (See Comments)     anxiety-nervousness   Oxycodone-Acetaminophen Itching   Promethazine Hcl Other (See Comments)     fainting   Quinolones Nausea Only    Cipro "felt real bad"   Telithromycin Nausea And Vomiting   Tramadol Nausea Only   Hydrocodone-Acetaminophen Rash   Statins Other (See Comments)    Leg cramps and makes patient feel bad. Per pt tried  multiple in years past   Sulfonamide Derivatives Rash   Family History  Problem Relation Age of Onset   Heart disease Mother    Hyperlipidemia Mother    Heart disease Father    Hyperlipidemia Father    Diabetes Brother    Colon cancer Neg Hx    Pancreatic cancer Neg Hx    Esophageal cancer Neg Hx    Stomach cancer Neg Hx    Liver cancer Neg Hx      Current Outpatient Medications (Cardiovascular):    Alirocumab (  PRALUENT) 150 MG/ML SOAJ, INJECT 1 DOSE INTO THE SKIN EVERY 14 (FOURTEEN) DAYS.   amLODipine (NORVASC) 5 MG tablet, Take 1 tablet (5 mg total) by mouth daily.   EPINEPHrine 0.3 mg/0.3 mL IJ SOAJ injection, Inject 0.3 mg into the muscle as needed for anaphylaxis. for allergic reaction   ezetimibe (ZETIA) 10 MG tablet, Take 10 mg by mouth daily.   hydrochlorothiazide (HYDRODIURIL) 25 MG tablet, TAKE 1 TABLET BY MOUTH EVERY DAY   nitroGLYCERIN (NITROSTAT) 0.4 MG SL tablet, PLACE 1 TABLET UNDER THE TONGUE EVERY 5 (FIVE) MINUTES AS NEEDED.   valsartan (DIOVAN) 320 MG tablet, Take 1 tablet (320 mg total) by mouth daily.  Current Outpatient Medications (Respiratory):    albuterol (PROAIR HFA) 108 (90 BASE) MCG/ACT inhaler, Inhale 1-2 puffs into the lungs every 6 (six) hours as needed for wheezing or shortness of breath.   FLOVENT HFA 110 MCG/ACT inhaler, Inhale 1 puff into the lungs 2 (two) times daily.    fluticasone (FLONASE) 50 MCG/ACT nasal spray, Place 1 spray into both nostrils 2 (two) times daily.    loratadine (CLARITIN) 10 MG tablet, Take 10 mg by mouth at bedtime.   Current Outpatient Medications (Analgesics):    acetaminophen (TYLENOL) 500 MG tablet, Take 1,000 mg by mouth every 6 (six) hours as needed for moderate pain or headache.   aspirin EC 81 MG tablet, Take 1 tablet (81 mg total) by mouth 2 (two) times daily after a meal.  Current Outpatient Medications (Hematological):    Cyanocobalamin (B-12) 2500 MCG TABS, Take 1,250 mcg by mouth daily.   folic acid  (FOLVITE) 347 MCG tablet, Take 800 mcg by mouth daily.   Current Outpatient Medications (Other):    busPIRone (BUSPAR) 5 MG tablet, Take 1 tablet (5 mg total) by mouth 2 (two) times daily as needed (anxiety).   cholecalciferol (VITAMIN D3) 25 MCG (1000 UNIT) tablet, Take 1,000 Units by mouth daily.   clotrimazole-betamethasone (LOTRISONE) cream, Apply 1 application topically See admin instructions. Twice daily every 4 days   diazepam (VALIUM) 5 MG tablet, Take 1 tablet (5 mg total) by mouth at bedtime as needed (sleep).   gabapentin (NEURONTIN) 300 MG capsule, Take 1 cap in AM, 1 cap at noon, 2 caps at bedtime   GEMTESA 75 MG TABS, TAKE 1 TABLET BY MOUTH DAILY.   Glucos-Chond-Hyal Ac-Ca Fructo (MOVE FREE JOINT HEALTH ADVANCE PO), Take 1 tablet by mouth daily.   ketoconazole (NIZORAL) 2 % cream, Apply 1 application topically See admin instructions. Twice a day every other day   Miconazole Nitrate (LOTRIMIN AF DEODORANT POWDER) 2 % AERP, Apply 1 spray topically daily as needed (jock itch).   nystatin-triamcinolone ointment (MYCOLOG), Apply 1 application topically See admin instructions. Twice daily every 4 days   pantoprazole (PROTONIX) 40 MG tablet, TAKE 1 TABLET BY MOUTH EVERY DAY   polyethylene glycol powder (GLYCOLAX/MIRALAX) powder, Take 17 g by mouth daily.   tiZANidine (ZANAFLEX) 4 MG tablet, Take 1 tablet (4 mg total) by mouth every 6 (six) hours as needed for muscle spasms.   Wheat Dextrin (BENEFIBER) POWD, Take 1 Dose by mouth daily. 1 dose = 2 teaspoons   Reviewed prior external information including notes and imaging from  primary care provider As well as notes that were available from care everywhere and other healthcare systems.  Past medical history, social, surgical and family history all reviewed in electronic medical record.  No pertanent information unless stated regarding to the chief complaint.   Review  of Systems:  No headache, visual changes, nausea, vomiting, diarrhea,  constipation, dizziness, abdominal pain, skin rash, fevers, chills, night sweats, weight loss, swollen lymph nodes, body aches, joint swelling, chest pain, shortness of breath, mood changes. POSITIVE muscle aches  Objective  There were no vitals taken for this visit.   General: No apparent distress alert and oriented x3 mood and affect normal, dressed appropriately.  HEENT: Pupils equal, extraocular movements intact  Respiratory: Patient's speak in full sentences and does not appear short of breath  Cardiovascular: No lower extremity edema, non tender, no erythema  Gait normal with good balance and coordination.  MSK:  Non tender with full range of motion and good stability and symmetric strength and tone of shoulders, elbows, wrist, hip, knee and ankles bilaterally.     Impression and Recommendations:     The above documentation has been reviewed and is accurate and complete [name]

## 2021-06-04 ENCOUNTER — Ambulatory Visit (HOSPITAL_BASED_OUTPATIENT_CLINIC_OR_DEPARTMENT_OTHER): Payer: Self-pay | Admitting: Physical Therapy

## 2021-06-04 NOTE — Therapy (Addendum)
?OUTPATIENT PHYSICAL THERAPY TREATMENT NOTE / RECERT ? ? ?Patient Name: Joshua [name] ?MRN: [id number] ?DOB:[birth year], 81 y.o., male ?Today's Date: 06/05/2021 ? ?PCP: [name], MD ? ? ? PT End of Session - 06/05/21 9509   ? ? Visit Number 4   ? Number of Visits 12   ? Date for PT Re-Evaluation 06/04/21   ? Progress Note Due on Visit --   ? PT Start Time [id number]   ? PT Stop Time 1015   ? PT Time Calculation (min) 44 min   ? Activity Tolerance Patient tolerated treatment well   ? Behavior During Therapy [facility] for tasks assessed/performed   ? ?  ?  ? ?  ? ? ? ? ?Past Medical History:  ?Diagnosis Date  ? Acute medial meniscal tear   ? Anemia 2015  ? Arthritis   ? "middle finger right hand; right knee; neck" (02/06/2014)  ? Asthma   ? seasonal  ? 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 CAD (coronary artery disease)   ? CABG 2001  ? Diverticulosis   ? DJD (degenerative joint disease)   ? BACK  ? GERD (gastroesophageal reflux disease)   ? H/O inguinal hernia repair 12/2018  ? History of gout   ? Hyperlipidemia   ? Hypertension   ? IBS (irritable bowel syndrome)   ? Obesity   ? OSA (obstructive sleep apnea)   ? USES CPAP   ? Pancreatitis ~ 1980  ? Peripheral vascular disease ([place])   ? Pre-diabetes   ? Prostate cancer ([place]) 04/2010  ? ?Past Surgical History:  ?Procedure Laterality Date  ? ANTERIOR CERVICAL DECOMP/DISCECTOMY FUSION  05/26/2006  ? PLATE PLACED  ? BACK SURGERY    ? CARDIAC CATHETERIZATION  11/20/1999  ? CORONARY ARTERY BYPASS GRAFT  11/20/1999  ? SVG-RI1-RI2, SVG-OM, SVG-dRCA  ? CORONARY STENT INTERVENTION N/A 04/13/2019  ? Procedure: CORONARY STENT INTERVENTION;  Surgeon: Burnell Blanks, MD;  Location: [place] [facility];  Service: Cardiovascular;  Laterality: N/A;  ? HERNIA REPAIR    ? INGUINAL HERNIA REPAIR Left 01/02/2019  ? Procedure: OPEN LEFT INGUINAL HERNIA REPAIR WITH MESH;  Surgeon: Johnathan Hausen, MD;  Location: [facility];  Service: General;  Laterality: Left;   ? KNEE ARTHROSCOPY Right 1992; 04/2008  ? LAPAROSCOPIC CHOLECYSTECTOMY  2007  ? LEFT HEART CATH AND CORS/GRAFTS ANGIOGRAPHY N/A 04/13/2019  ? Procedure: LEFT HEART CATH AND CORS/GRAFTS ANGIOGRAPHY;  Surgeon: Burnell Blanks, MD;  Location: [place] [facility];  Service: Cardiovascular;  Laterality: N/A;  ? NASAL SEPTUM SURGERY Right 2007  ? 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  ? ROBOT ASSISTED LAPAROSCOPIC RADICAL PROSTATECTOMY  04/2010  ? THYROIDECTOMY, PARTIAL  1980  ? 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  ? Procedure: LEFT TOTAL KNEE ARTHROPLASTY;  Surgeon: Melrose Nakayama, MD;  Location: [facility];  Service: Orthopedics;  Laterality: Left;  ? ?Patient Active Problem List  ? Diagnosis Date Noted  ? Greater trochanteric bursitis of right hip 02/17/2021  ? Hyperactive bowel sounds 01/29/2021  ? Bloating 12/27/2020  ? 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 ([place]) 09/25/2020  ? Primary osteoarthritis of left knee 06/18/2020  ? Pre-operative respiratory examination 06/05/2020  ? Coronary artery disease involving coronary bypass graft of native heart without angina pectoris 04/07/2020  ? Trigger thumb, left thumb 01/24/2020  ? Overactive bladder 11/24/2019  ? Left lateral epicondylitis 09/05/2019  ? Degenerative arthritis of left knee 08/08/2019  ? Educated about COVID-19  virus infection 04/20/2019  ? Unstable angina (HCC)   ? Greater trochanteric bursitis of left hip 10/27/2018  ? Degenerative disc disease, lumbar 10/27/2018  ? Abdominal muscle strain, initial encounter 06/16/2018  ? Aortic atherosclerosis ([place]) 05/05/2018  ? Hyperglycemia 10/07/2017  ? Weakness of left leg 06/21/2017  ? DJD (degenerative joint disease)   ? Arthritis   ? Neck pain 02/08/2017  ? Venous insufficiency 12/29/2016  ? Ganglion cyst of tendon sheath of right hand 12/29/2016  ? Avulsion fracture of metatarsal bone of right foot 11/25/2016  ? Gout 11/20/2016  ? Obesity, Class I, BMI 30-34.9 10/03/2015  ? Hereditary and idiopathic peripheral  neuropathy 09/25/2014  ? Allergic rhinitis 05/31/2014  ? Fatigue 11/09/2013  ? CKD (chronic kidney disease), stage III ([place]) 10/23/2013  ? Dizziness 08/23/2013  ? Neuropathy ([place]) 08/21/2013  ? RLS (restless legs syndrome) 01/03/2013  ? Chronic bilateral low back pain with left-sided sciatica 10/09/2011  ? History of bladder cancer 04/01/2010  ? OSTEOARTHRITIS, LOWER LEG, LEFT 10/04/2009  ? Generalized abdominal pain 01/31/2009  ? ANEMIA, IRON DEFICIENCY 11/06/2008  ? 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 Essential hypertension 02/21/2007  ? OSA (obstructive sleep apnea) 11/02/2006  ? Hyperlipidemia 10/12/2006  ? Major depression in full remission ([place]) 10/12/2006  ? Coronary atherosclerosis 10/12/2006  ? Asthma 10/12/2006  ? GERD 10/12/2006  ? Irritable bowel syndrome 10/12/2006  ?REFERRING PROVIDER: Lyndal Pulley, DO ?  ?REFERRING DIAG: M54.50 (ICD-10-CM) - Lumbar spine pain  ?     M17.12 (ICD-10-CM) - Primary osteoarthritis of left knee  ?  ?THERAPY DIAG:  ?Chronic low back pain, unspecified back pain laterality, unspecified whether sciatica present ?  ?Left knee pain, unspecified chronicity ?  ?Muscle weakness (generalized) ?  ?ONSET DATE: Chronic pain / MD note 03/28/2021 ?  ?SUBJECTIVE:                                                                                                                                                                                          ?  ?SUBJECTIVE STATEMENT: ?Pt reports he was in a lot of pain Friday, Saturday, Sunday; took medicine to lessen the pain.  The last 2 days have been "good days" with less pain.   ?  ?PERTINENT HISTORY:  ?-Chronic Lumbar pain.  Spondylolisthesis at L4-L5 level and possible minimal anterolisthesis at L5-S1. ?Pt states he has an AAA though [place] stated the mild prominence does not meet the criteria for aneurysm; ectasia of infrarenal aorta.  Chronic kidney disease. Hx of prostate and bladder CA.  Pt recently had Covid which was dx'd on 1/13. ?  ?         -  PSHx:  L inguinal hernia repair in 2020, L TKA on 06/18/2020 ; CAD with CABG x5 on 11/20/1999 and Coronary stent placement on 04/13/2019 ;  Anterior cervical decomp/discectomy/fusion on 05/26/2006  ?  ?  ?PAIN:  ?Are you having pain? Yes ?NPRS scale: Lumbar:  5/10 ?                        L Knee:  5/10 ?Pain location: both sides of lumbar spine > central lumbar ?Aggravating factors: standing, ambulation, sitting in a bad chair ?Relieving factors ?  ?PRECAUTIONS: Other: Lumbar spondylolisthesis, Hx of CABG and cervical fusion; aorta ectasia ?  ?  ?OCCUPATION: Pt is retired ?  ?PLOF: Independent ?  ?PATIENT GOALS reduced pain, improved tolerance to activity, wants to be able to exercise ?  ?  ?OBJECTIVE:  ?  ?DIAGNOSTIC FINDINGS:  ?-Lumbar X ray: ? Lumbar spondylosis with interval progression. There is first-degree spondylolisthesis ?at L4-L5 level. There is possible minimal anterolisthesis at L5-S1 level. ?ectasia of infrarenal aorta. ?  ?-X ray of knee in November:   ?IMPRESSION:  Uncomplicated left total knee prosthesis. ?  ?-Abdominal US:   ?1. 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 While the distal abdominal ?aorta is mildly prominent measuring 2.7 cm, by CT criteria, ?comparing to the more proximal normal aorta, the mild prominence ?does not meet the criteria for aneurysm. No follow-up imaging ?required. ?                   ?  ?LE AROM/PROM: ?  ?A/PROM Right ?04/24/2021 Left ?04/24/2021 Right  ?06/05/21 Right  ?06/05/21  ?Hip flexion 4+/5 4/5 5/5 4-/5  ?Hip extension        ?Hip abduction 4+/5 4/5 5/5 4+/5  ?Hip adduction        ?Hip internal rotation        ?Hip external rotation 4/5 4-/5    ?Knee flexion 5/5 4+/5 5/5 4+/5  ?Knee extension 5/5 5/5    ?Ankle dorsiflexion        ?Ankle plantarflexion        ?Ankle inversion        ?Ankle eversion        ? (Blank rows = not tested) ?  ? ?  ?   ?TODAY'S TREATMENT  ?06/05/21 ?Pt seen for aquatic therapy today.  Treatment took place in water 3.25-4 ft in depth at the  [facility] pool. Temp of water was 91?.  Pt entered/exited the pool via stairs with step to pattern, independently,  bilat rail. ? ?Forward gait, Side stepping and retro gait - 3 laps of each ?Ab

## 2021-06-05 ENCOUNTER — Ambulatory Visit: Payer: PPO | Admitting: Family Medicine

## 2021-06-05 ENCOUNTER — Encounter: Payer: Self-pay | Admitting: Family Medicine

## 2021-06-05 ENCOUNTER — Ambulatory Visit (HOSPITAL_BASED_OUTPATIENT_CLINIC_OR_DEPARTMENT_OTHER): Payer: PPO | Admitting: Physical Therapy

## 2021-06-05 ENCOUNTER — Other Ambulatory Visit: Payer: Self-pay

## 2021-06-05 ENCOUNTER — Encounter (HOSPITAL_BASED_OUTPATIENT_CLINIC_OR_DEPARTMENT_OTHER): Payer: Self-pay | Admitting: Physical Therapy

## 2021-06-05 VITALS — BP 142/74 | HR 78 | Ht 66.0 in | Wt 208.8 lb

## 2021-06-05 DIAGNOSIS — G8929 Other chronic pain: Secondary | ICD-10-CM

## 2021-06-05 DIAGNOSIS — M25562 Pain in left knee: Secondary | ICD-10-CM

## 2021-06-05 DIAGNOSIS — M545 Low back pain, unspecified: Secondary | ICD-10-CM | POA: Diagnosis not present

## 2021-06-05 DIAGNOSIS — M5136 Other intervertebral disc degeneration, lumbar region: Secondary | ICD-10-CM | POA: Diagnosis not present

## 2021-06-05 DIAGNOSIS — M6281 Muscle weakness (generalized): Secondary | ICD-10-CM

## 2021-06-05 DIAGNOSIS — M5442 Lumbago with sciatica, left side: Secondary | ICD-10-CM

## 2021-06-05 NOTE — Patient Instructions (Addendum)
Good to see you.  ? ?L L4-5  epidural Dr. Jeralyn Ruths if possible.  Please call [facility] at [phone] or [phone]. ? ?Follow-up: 5-6 weeks after the epidural. ? ?

## 2021-06-05 NOTE — Addendum Note (Signed)
Addended by: Marijo Sanes on: 06/05/2021 06:38 PM ? ? Modules accepted: Orders ? ?

## 2021-06-05 NOTE — Assessment & Plan Note (Signed)
Chronic, with exacerbation.  Most recent MRI does show progression of the arthritic changes.  Seems to be worse in the L4-L5 area.  Would like to try an epidural in the L4-L5 area.  Patient did not respond to previous injections at the L5-S1 area.  Hoping that this will make a little bit more of a difference.  Follow-up with me again in 4 to 6 weeks afterwards.  Total time with patient and reviewing patient's MRI greater than 31 minutes ?

## 2021-06-09 ENCOUNTER — Encounter: Payer: Self-pay | Admitting: Pulmonary Disease

## 2021-06-09 ENCOUNTER — Ambulatory Visit: Payer: PPO | Admitting: Pulmonary Disease

## 2021-06-09 ENCOUNTER — Other Ambulatory Visit: Payer: Self-pay

## 2021-06-09 VITALS — BP 110/60 | HR 78 | Temp 97.7°F | Ht 66.0 in | Wt 207.6 lb

## 2021-06-09 DIAGNOSIS — G4733 Obstructive sleep apnea (adult) (pediatric): Secondary | ICD-10-CM

## 2021-06-09 DIAGNOSIS — M25562 Pain in left knee: Secondary | ICD-10-CM | POA: Diagnosis not present

## 2021-06-09 NOTE — Progress Notes (Signed)
? ?[facility] ? ?Chief Complaint  ?Patient presents with  ? Follow-up  ?  Cpap compliance, lost SD card   ? ? ?Past Surgical History:  ?He  has a past surgical history that includes Thyroidectomy, partial (1980); Anterior cervical decomp/discectomy fusion (05/26/2006); Knee arthroscopy (Right, 1992; 04/2008); Robot assisted laparoscopic radical prostatectomy (04/2010); Nasal septum surgery (Right, 2007); Laparoscopic cholecystectomy (2007); Cardiac catheterization (11/20/1999); Coronary artery bypass graft (11/20/1999); Inguinal hernia repair (Left, 01/02/2019); LEFT HEART CATH AND CORS/GRAFTS ANGIOGRAPHY (N/A, 04/13/2019); CORONARY STENT INTERVENTION (N/A, 04/13/2019); Back surgery; Hernia repair; Total knee arthroplasty (Left, 06/18/2020); and Replacement total knee (05/2020). ? ?Past Medical History:  ?Prostate cancer, Pancreatitis, IBS, HTN, HLD, Gout, GERD, DJD, Diverticulosis, Depression, CAD, Bladder cancer, Asthma, OA, Anemia ? ?Constitutional:  ?BP 110/60 (BP Location: Left Arm, Cuff Size: Normal)   Pulse 78   Temp 97.7 ?F (36.5 ?C) (Oral)   Ht '5\' 6"'$  (1.676 m)   Wt 207 lb 9.6 oz (94.2 kg)   SpO2 96%   BMI 33.51 kg/m?  ? ?Brief Summary:  ?Joshua [name] is a 81 y.o. male with obstructive sleep apnea. ?  ? ? ? ?Subjective:  ? ?He uses CPAP nightly.  Still has trouble with mask leak if he doesn't tighten mask.  His cheeks will start to get sore after about 6 to 7 hours of CPAP use.  He takes mask off and sleeps for another hour.  He has been to sleep lab twice to get mask refitting done.  He isn't able to use nasal interfaces due to mask leak.  ? ?Physical Exam:  ? ?Appearance - well kempt  ? ?ENMT - no sinus tenderness, no oral exudate, no LAN, Mallampati 3 airway, no stridor, scalloped tongue ? ?Respiratory - equal breath sounds bilaterally, no wheezing or rales ? ?CV - s1s2 regular rate and rhythm, no murmurs ? ?Ext - no clubbing, no edema ? ?Skin - no  rashes ? ?Psych - normal mood and affect ?  ?Sleep Tests:  ?PSG 12/25/13 >> AHI 13 ?Auto CPAP 12/25/20 to 01/23/21 >> used on 30 of 30 nights with average 7 hrs 37 min.  Average AHI 1.5 with median CPAP 9 and 95 th percentile CPAP 12 cm H2O ? ?Cardiac Tests:  ?Echo 05/10/12 >> EF 60 to 65%, mild LVH ? ?Social History:  ?He  reports that he quit smoking about 29 years ago. His smoking use included cigarettes. He has a 35.00 pack-year smoking history. He has never used smokeless tobacco. He reports current alcohol use of about 2.0 standard drinks per week. He reports that he does not use drugs. ? ?Family History:  ?His family history includes Diabetes in his brother; Heart disease in his father and mother; Hyperlipidemia in his father and mother. ?  ? ? ?Assessment/Plan:  ? ?Obstructive sleep apnea. ?- he is compliant with CPAP and reports benefit from therapy ?- he uses [employer] for his DME ?- continue auto CPAP 6 to 12 cm H2O ?- discussed options to assist with mask fit; he will look up cloth masks on line and call if he wants to try this ?- will have him re-enrolled in [place] ? ?Coronary artery disease s/p CABG. ?- followed by Dr. Mali Hilty with cardiology ? ?Peripheral neuropathy. ?- followed by Dr. Ellouise Newer with [facility] ? ?Time Spent Involved in Patient Care on Day of Examination:  ?26 minutes ? ?Follow up:  ? ?Patient Instructions  ?You can look up Sleep  [employer] CPAP masks on-line ? ?Will have [employer] re-enroll you in CPAP monitoring program ? ?Follow up in 1 year ? ?Medication List:  ? ?Allergies as of 06/09/2021   ? ?   Reactions  ? Benadryl [diphenhydramine] Other (See Comments)  ? Pt told not to take because of bypass surgery  ? Bromfed Nausea And Vomiting  ? Clarithromycin Other (See Comments)  ?  gastritis  ? Codeine Other (See Comments)  ? Trouble breathing  ? Doxycycline Hyclate Nausea And Vomiting  ? Lexapro [escitalopram]   ? Dizziness  ? Modafinil Other (See Comments)  ?   anxiety-nervousness  ? Oxycodone-acetaminophen Itching  ? Promethazine Hcl Other (See Comments)  ?  fainting  ? Quinolones Nausea Only  ? Cipro ?"felt real bad"  ? Telithromycin Nausea And Vomiting  ? Tramadol Nausea Only  ? Hydrocodone-acetaminophen Rash  ? Statins Other (See Comments)  ? Leg cramps and makes patient feel bad. Per pt tried multiple in years past  ? Sulfonamide Derivatives Rash  ? ?  ? ?  ?Medication List  ?  ? ?  ? Accurate as of June 09, 2021  9:34 AM. If you have any questions, ask your nurse or doctor.  ?  ?  ? ?  ? ?acetaminophen 500 MG tablet ?Commonly known as: TYLENOL ?Take 1,000 mg by mouth every 6 (six) hours as needed for moderate pain or headache. ?  ?albuterol 108 (90 Base) MCG/ACT inhaler ?Commonly known as: ProAir HFA ?Inhale 1-2 puffs into the lungs every 6 (six) hours as needed for wheezing or shortness of breath. ?  ?amLODipine 5 MG tablet ?Commonly known as: NORVASC ?Take 1 tablet (5 mg total) by mouth daily. ?  ?aspirin EC 81 MG tablet ?Take 1 tablet (81 mg total) by mouth 2 (two) times daily after a meal. ?  ?B-12 2500 MCG Tabs ?Take 1,250 mcg by mouth daily. ?  ?Benefiber Powd ?Take 1 Dose by mouth daily. 1 dose = 2 teaspoons ?  ?busPIRone 5 MG tablet ?Commonly known as: BUSPAR ?Take 1 tablet (5 mg total) by mouth 2 (two) times daily as needed (anxiety). ?  ?cholecalciferol 25 MCG (1000 UNIT) tablet ?Commonly known as: VITAMIN D3 ?Take 1,000 Units by mouth daily. ?  ?clotrimazole-betamethasone cream ?Commonly known as: LOTRISONE ?Apply 1 application topically See admin instructions. Twice daily every 4 days ?  ?diazepam 5 MG tablet ?Commonly known as: VALIUM ?Take 1 tablet (5 mg total) by mouth at bedtime as needed (sleep). ?  ?EPINEPHrine 0.3 mg/0.3 mL Soaj injection ?Commonly known as: EPI-PEN ?Inject 0.3 mg into the muscle as needed for anaphylaxis. for allergic reaction ?  ?ezetimibe 10 MG tablet ?Commonly known as: ZETIA ?Take 10 mg by mouth daily. ?  ?Flovent HFA 110  MCG/ACT inhaler ?Generic drug: fluticasone ?Inhale 1 puff into the lungs 2 (two) times daily. ?  ?fluticasone 50 MCG/ACT nasal spray ?Commonly known as: FLONASE ?Place 1 spray into both nostrils 2 (two) times daily. ?  ?folic acid 630 MCG tablet ?Commonly known as: FOLVITE ?Take 800 mcg by mouth daily. ?  ?gabapentin 300 MG capsule ?Commonly known as: NEURONTIN ?Take 1 cap in AM, 1 cap at noon, 2 caps at bedtime ?  ?Gemtesa 75 MG Tabs ?Generic drug: Vibegron ?TAKE 1 TABLET BY MOUTH DAILY. ?  ?hydrochlorothiazide 25 MG tablet ?Commonly known as: HYDRODIURIL ?TAKE 1 TABLET BY MOUTH EVERY DAY ?  ?ketoconazole 2 % cream ?Commonly known as: NIZORAL ?Apply 1 application topically See admin  instructions. Twice a day every other day ?  ?loratadine 10 MG tablet ?Commonly known as: CLARITIN ?Take 10 mg by mouth at bedtime. ?  ?Lotrimin AF Deodorant Powder 2 % Aerp ?Generic drug: Miconazole Nitrate ?Apply 1 spray topically daily as needed (jock itch). ?  ?MOVE FREE JOINT HEALTH ADVANCE PO ?Take 1 tablet by mouth daily. ?  ?nitroGLYCERIN 0.4 MG SL tablet ?Commonly known as: NITROSTAT ?PLACE 1 TABLET UNDER THE TONGUE EVERY 5 (FIVE) MINUTES AS NEEDED. ?  ?nystatin-triamcinolone ointment ?Commonly known as: MYCOLOG ?Apply 1 application topically See admin instructions. Twice daily every 4 days ?  ?pantoprazole 40 MG tablet ?Commonly known as: PROTONIX ?TAKE 1 TABLET BY MOUTH EVERY DAY ?  ?polyethylene glycol powder 17 GM/SCOOP powder ?Commonly known as: GLYCOLAX/MIRALAX ?Take 17 g by mouth daily. ?  ?Praluent 150 MG/ML Soaj ?Generic drug: Alirocumab ?INJECT 1 DOSE INTO THE SKIN EVERY 14 (FOURTEEN) DAYS. ?  ?tiZANidine 4 MG tablet ?Commonly known as: Zanaflex ?Take 1 tablet (4 mg total) by mouth every 6 (six) hours as needed for muscle spasms. ?  ?valsartan 320 MG tablet ?Commonly known as: DIOVAN ?Take 1 tablet (320 mg total) by mouth daily. ?  ? ?  ? ? ?Signature:  ?Chesley Mires, MD ?[place] ?Pager - [phone] - [phone] ?06/09/2021, 9:34 AM ?  ? ? ? ? ? ? ? ? ?

## 2021-06-09 NOTE — Patient Instructions (Signed)
You can look up Sleep [employer] CPAP masks on-line ? ?Will have [employer] re-enroll you in CPAP monitoring program ? ?Follow up in 1 year ?

## 2021-06-11 ENCOUNTER — Encounter (HOSPITAL_BASED_OUTPATIENT_CLINIC_OR_DEPARTMENT_OTHER): Payer: Self-pay | Admitting: Physical Therapy

## 2021-06-11 ENCOUNTER — Ambulatory Visit (HOSPITAL_BASED_OUTPATIENT_CLINIC_OR_DEPARTMENT_OTHER): Payer: PPO | Admitting: Physical Therapy

## 2021-06-11 ENCOUNTER — Other Ambulatory Visit: Payer: Self-pay

## 2021-06-11 DIAGNOSIS — M545 Low back pain, unspecified: Secondary | ICD-10-CM | POA: Diagnosis not present

## 2021-06-11 DIAGNOSIS — M6281 Muscle weakness (generalized): Secondary | ICD-10-CM

## 2021-06-11 DIAGNOSIS — G8929 Other chronic pain: Secondary | ICD-10-CM

## 2021-06-11 DIAGNOSIS — M25562 Pain in left knee: Secondary | ICD-10-CM

## 2021-06-11 NOTE — Therapy (Addendum)
?OUTPATIENT PHYSICAL THERAPY TREATMENT NOTE / RECERT ? ? ?Patient Name: Joshua [name] ?MRN: [id number] ?DOB:02/11/1941, 81 y.o., male ?Today's Date: 06/11/2021 ? ?PCP: [name], MD ? ? ? PT End of Session - 06/11/21 1104   ? ? Visit Number 5   ? Number of Visits 12   ? Authorization - Visit Number 5   ? Authorization - Number of Visits 12   ? Progress Note Due on Visit 10   ? PT Start Time 1103   ? PT Stop Time 1144   ? PT Time Calculation (min) 41 min   ? Activity Tolerance Patient tolerated treatment well   ? Behavior During Therapy [facility] for tasks assessed/performed   ? ?  ?  ? ?  ? ? ? ? ?Past Medical History:  ?Diagnosis Date  ? Acute medial meniscal tear   ? Anemia 2015  ? Arthritis   ? "middle finger right hand; right knee; neck" (02/06/2014)  ? Asthma   ? seasonal  ? Bladder cancer ([place]) 04/2010  ? "cauterized during prostate OR"  ? CAD (coronary artery disease)   ? CABG 2001  ? Diverticulosis   ? DJD (degenerative joint disease)   ? BACK  ? GERD (gastroesophageal reflux disease)   ? H/O inguinal hernia repair 12/2018  ? History of gout   ? Hyperlipidemia   ? Hypertension   ? IBS (irritable bowel syndrome)   ? Obesity   ? OSA (obstructive sleep apnea)   ? USES CPAP   ? Pancreatitis ~ 1980  ? Peripheral vascular disease ([place])   ? Pre-diabetes   ? Prostate cancer ([place]) 04/2010  ? ?Past Surgical History:  ?Procedure Laterality Date  ? ANTERIOR CERVICAL DECOMP/DISCECTOMY FUSION  05/26/2006  ? PLATE PLACED  ? BACK SURGERY    ? CARDIAC CATHETERIZATION  11/20/1999  ? CORONARY ARTERY BYPASS GRAFT  11/20/1999  ? SVG-RI1-RI2, SVG-OM, SVG-dRCA  ? CORONARY STENT INTERVENTION N/A 04/13/2019  ? Procedure: CORONARY STENT INTERVENTION;  Surgeon: Burnell Blanks, MD;  Location: [place] [facility];  Service: Cardiovascular;  Laterality: N/A;  ? HERNIA REPAIR    ? INGUINAL HERNIA REPAIR Left 01/02/2019  ? Procedure: OPEN LEFT INGUINAL HERNIA REPAIR WITH MESH;  Surgeon: Johnathan Hausen, MD;  [facility];   Service: General;  Laterality: Left;  ? KNEE ARTHROSCOPY Right 1992; 04/2008  ? LAPAROSCOPIC CHOLECYSTECTOMY  2007  ? LEFT HEART CATH AND CORS/GRAFTS ANGIOGRAPHY N/A 04/13/2019  ? Procedure: LEFT HEART CATH AND CORS/GRAFTS ANGIOGRAPHY;  Surgeon: Burnell Blanks, MD;  Location: [place] [facility];  Service: Cardiovascular;  Laterality: N/A;  ? NASAL SEPTUM SURGERY Right 2007  ? 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  ? ROBOT ASSISTED LAPAROSCOPIC RADICAL PROSTATECTOMY  04/2010  ? THYROIDECTOMY, PARTIAL  1980  ? 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  ? Procedure: LEFT TOTAL KNEE ARTHROPLASTY;  Surgeon: Melrose Nakayama, MD;  [facility];  Service: Orthopedics;  Laterality: Left;  ? ?Patient Active Problem List  ? Diagnosis Date Noted  ? Greater trochanteric bursitis of right hip 02/17/2021  ? Hyperactive bowel sounds 01/29/2021  ? Bloating 12/27/2020  ? 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 ([place]) 09/25/2020  ? Primary osteoarthritis of left knee 06/18/2020  ? Pre-operative respiratory examination 06/05/2020  ? Coronary artery disease involving coronary bypass graft of native heart without angina pectoris 04/07/2020  ? Trigger thumb, left thumb 01/24/2020  ? Overactive bladder 11/24/2019  ? Left lateral epicondylitis 09/05/2019  ? Degenerative arthritis  of left knee 08/08/2019  ? Educated about COVID-19 virus infection 04/20/2019  ? Unstable angina (HCC)   ? Greater trochanteric bursitis of left hip 10/27/2018  ? Degenerative disc disease, lumbar 10/27/2018  ? Abdominal muscle strain, initial encounter 06/16/2018  ? Aortic atherosclerosis ([place]) 05/05/2018  ? Hyperglycemia 10/07/2017  ? Weakness of left leg 06/21/2017  ? DJD (degenerative joint disease)   ? Arthritis   ? Neck pain 02/08/2017  ? Venous insufficiency 12/29/2016  ? Ganglion cyst of tendon sheath of right hand 12/29/2016  ? Avulsion fracture of metatarsal bone of right foot 11/25/2016  ? Gout 11/20/2016  ? Obesity, Class I, BMI 30-34.9 10/03/2015  ?  Hereditary and idiopathic peripheral neuropathy 09/25/2014  ? Allergic rhinitis 05/31/2014  ? Fatigue 11/09/2013  ? CKD (chronic kidney disease), stage III ([place]) 10/23/2013  ? Dizziness 08/23/2013  ? Neuropathy ([place]) 08/21/2013  ? RLS (restless legs syndrome) 01/03/2013  ? Chronic bilateral low back pain with left-sided sciatica 10/09/2011  ? History of bladder cancer 04/01/2010  ? OSTEOARTHRITIS, LOWER LEG, LEFT 10/04/2009  ? Generalized abdominal pain 01/31/2009  ? ANEMIA, IRON DEFICIENCY 11/06/2008  ? 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 Essential hypertension 02/21/2007  ? OSA (obstructive sleep apnea) 11/02/2006  ? Hyperlipidemia 10/12/2006  ? Major depression in full remission ([place]) 10/12/2006  ? Coronary atherosclerosis 10/12/2006  ? Asthma 10/12/2006  ? GERD 10/12/2006  ? Irritable bowel syndrome 10/12/2006  ?REFERRING PROVIDER: Lyndal Pulley, DO ?  ?REFERRING DIAG: M54.50 (ICD-10-CM) - Lumbar spine pain  ?     M17.12 (ICD-10-CM) - Primary osteoarthritis of left knee  ?  ?THERAPY DIAG:  ?Chronic low back pain, unspecified back pain laterality, unspecified whether sciatica present ?  ?Left knee pain, unspecified chronicity ?  ?Muscle weakness (generalized) ?  ?ONSET DATE: Chronic pain / MD note 03/28/2021 ?  ?SUBJECTIVE:                                                                                                                                                                                          ?  ?SUBJECTIVE STATEMENT: ?"I'm not good." "It's (the pain) terrible".  Pt reports he washed the floors yesterday by bending over to clean (doesn't have mop) . "It wiped me out, I had to rest after that".  He saw surgeon for knee Monday; "having a bone scan, and if it doesn't show anything, I just have to live with it". ?  ?PERTINENT HISTORY:  ?-Chronic Lumbar pain.  Spondylolisthesis at L4-L5 level and possible minimal anterolisthesis at L5-S1. ?Pt states he has an AAA though [place] stated the mild  prominence does not  meet the criteria for aneurysm; ectasia of infrarenal aorta.  Chronic kidney disease. Hx of prostate and bladder CA.  Pt recently had Covid which was dx'd on 1/13. ?  ?        -PSHx:  L inguinal hernia repair in 2020, L TKA on 06/18/2020 ; CAD with CABG x5 on 11/20/1999 and Coronary stent placement on 04/13/2019 ;  Anterior cervical decomp/discectomy/fusion on 05/26/2006  ?  ?  ?PAIN:  ?Are you having pain? Yes ?NPRS scale: Lumbar:  6/10 ?                        L Knee:  7/10 ?Pain location: both sides of lumbar spine > central lumbar ?Aggravating factors: standing, ambulation, sitting in a bad chair ?Relieving factors ?  ?PRECAUTIONS: Other: Lumbar spondylolisthesis, Hx of CABG and cervical fusion; aorta ectasia ?  ?  ?OCCUPATION: Pt is retired ?  ?PLOF: Independent ?  ?PATIENT GOALS reduced pain, improved tolerance to activity, wants to be able to exercise ?  ?  ?OBJECTIVE:  ?  ?DIAGNOSTIC FINDINGS:  ?-Lumbar X ray: ? Lumbar spondylosis with interval progression. There is first-degree spondylolisthesis ?at L4-L5 level. There is possible minimal anterolisthesis at L5-S1 level. ?ectasia of infrarenal aorta. ?  ?-X ray of knee in November:   ?IMPRESSION:  Uncomplicated left total knee prosthesis. ?  ?-Abdominal US:   ?1. 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 While the distal abdominal ?aorta is mildly prominent measuring 2.7 cm, by CT criteria, ?comparing to the more proximal normal aorta, the mild prominence ?does not meet the criteria for aneurysm. No follow-up imaging ?required. ?                   ?  ?LE AROM/PROM: ?  ?A/PROM Right ?04/24/2021 Left ?04/24/2021 Right  ?06/05/21 Right  ?06/05/21  ?Hip flexion 4+/5 4/5 5/5 4-/5  ?Hip extension        ?Hip abduction 4+/5 4/5 5/5 4+/5  ?Hip adduction        ?Hip internal rotation        ?Hip external rotation 4/5 4-/5    ?Knee flexion 5/5 4+/5 5/5 4+/5  ?Knee extension 5/5 5/5    ?Ankle dorsiflexion        ?Ankle plantarflexion        ?Ankle inversion         ?Ankle eversion        ? (Blank rows = not tested) ?  ? ?  ?   ?TODAY'S TREATMENT  ?3/22 ?Pt seen for aquatic therapy today.  Treatment took place in water 3.25-4 ft in depth at the [facility] pool. Tem

## 2021-06-12 DIAGNOSIS — J3089 Other allergic rhinitis: Secondary | ICD-10-CM | POA: Diagnosis not present

## 2021-06-12 DIAGNOSIS — J301 Allergic rhinitis due to pollen: Secondary | ICD-10-CM | POA: Diagnosis not present

## 2021-06-12 DIAGNOSIS — J3081 Allergic rhinitis due to animal (cat) (dog) hair and dander: Secondary | ICD-10-CM | POA: Diagnosis not present

## 2021-06-17 ENCOUNTER — Ambulatory Visit (HOSPITAL_BASED_OUTPATIENT_CLINIC_OR_DEPARTMENT_OTHER): Payer: PPO | Admitting: Physical Therapy

## 2021-06-17 ENCOUNTER — Encounter (HOSPITAL_BASED_OUTPATIENT_CLINIC_OR_DEPARTMENT_OTHER): Payer: Self-pay

## 2021-06-18 ENCOUNTER — Ambulatory Visit (HOSPITAL_BASED_OUTPATIENT_CLINIC_OR_DEPARTMENT_OTHER): Payer: Self-pay | Admitting: Physical Therapy

## 2021-06-18 NOTE — Progress Notes (Signed)
?Joshua [name] D.O. ?[facility] ?[place] ?Phone: [phone] ?Subjective:   ?I, Joshua [name], am serving as a Education administrator for Dr. Hulan Saas. ?This visit occurred during the SARS-CoV-2 public health emergency.  Safety protocols were in place, including screening questions prior to the visit, additional usage of staff PPE, and extensive cleaning of exam room while observing appropriate contact time as indicated for disinfecting solutions.  ? ?I'm seeing this patient by the request  of:  Joshua Olp, MD ? ?CC: Right back pain, hip pain ? ?XBL:[id number]  ?06/05/2021 ?Chronic, with exacerbation.  Most recent MRI does show progression of the arthritic changes.  Seems to be worse in the L4-L5 area.  Would like to try an epidural in the L4-L5 area.  Patient did not respond to previous injections at the L5-S1 area.  Hoping that this will make a little bit more of a difference.  Follow-up with me again in 4 to 6 weeks afterwards.  Total time with patient and reviewing patient's MRI greater than 31 minutes ? ?Updated 3/3/0/2023 ?Joshua [name] is a 81 y.o. male coming in with complaint of back pain. Hip pain started a couple months ago right side. Monday was hard for you to get out of bed. Pain not keeping up at night, but able to go back to sleep. Tylenol helped, but knocked out. Aqua therapy didn't seem to be working. Will give updated on knee.  Patient states unfortunately continues to give discomfort of the knee as well.  Patient is scheduled for a bone scan by orthopedist. ? ? ? ?  ? ?Past Medical History:  ?Diagnosis Date  ? Acute medial meniscal tear   ? Anemia 2015  ? Arthritis   ? "middle finger right hand; right knee; neck" (02/06/2014)  ? Asthma   ? seasonal  ? 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 "cauterized during prostate OR"  ? CAD (coronary artery disease)   ? CABG 2001  ? Diverticulosis   ? DJD (degenerative joint disease)   ? BACK  ? GERD (gastroesophageal reflux  disease)   ? H/O inguinal hernia repair 12/2018  ? History of gout   ? Hyperlipidemia   ? Hypertension   ? IBS (irritable bowel syndrome)   ? Obesity   ? OSA (obstructive sleep apnea)   ? USES CPAP   ? Pancreatitis ~ 1980  ? Peripheral vascular disease ([place])   ? Pre-diabetes   ? Prostate cancer ([place]) 04/2010  ? ?Past Surgical History:  ?Procedure Laterality Date  ? ANTERIOR CERVICAL DECOMP/DISCECTOMY FUSION  05/26/2006  ? PLATE PLACED  ? BACK SURGERY    ? CARDIAC CATHETERIZATION  11/20/1999  ? CORONARY ARTERY BYPASS GRAFT  11/20/1999  ? SVG-RI1-RI2, SVG-OM, SVG-dRCA  ? CORONARY STENT INTERVENTION N/A 04/13/2019  ? Procedure: CORONARY STENT INTERVENTION;  Surgeon: Burnell Blanks, MD;  Location: [place] [facility];  Service: Cardiovascular;  Laterality: N/A;  ? HERNIA REPAIR    ? INGUINAL HERNIA REPAIR Left 01/02/2019  ? Procedure: OPEN LEFT INGUINAL HERNIA REPAIR WITH MESH;  Surgeon: Johnathan Hausen, MD;  [facility];  Service: General;  Laterality: Left;  ? KNEE ARTHROSCOPY Right 1992; 04/2008  ? LAPAROSCOPIC CHOLECYSTECTOMY  2007  ? LEFT HEART CATH AND CORS/GRAFTS ANGIOGRAPHY N/A 04/13/2019  ? Procedure: LEFT HEART CATH AND CORS/GRAFTS ANGIOGRAPHY;  Surgeon: Burnell Blanks, MD;  Location: [place] [facility];  Service: Cardiovascular;  Laterality: N/A;  ? NASAL SEPTUM SURGERY Right 2007  ?  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  ? ROBOT ASSISTED LAPAROSCOPIC RADICAL PROSTATECTOMY  04/2010  ? THYROIDECTOMY, PARTIAL  1980  ? 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  ? Procedure: LEFT TOTAL KNEE ARTHROPLASTY;  Surgeon: Melrose Nakayama, MD;  [facility];  Service: Orthopedics;  Laterality: Left;  ? ?Social History  ? ?Socioeconomic History  ? Marital status: Married  ?  Spouse name: Not on file  ? Number of children: Not on file  ? Years of education: Not on file  ? Highest education level: Not on file  ?Occupational History  ? Occupation: retired  ?  Employer: RETIRED  ?Tobacco Use  ? Smoking status:  Former  ?  Packs/day: 1.00  ?  Years: 35.00  ?  Pack years: 35.00  ?  Types: Cigarettes  ?  Quit date: 06/16/1992  ?  Years since quitting: 29.0  ? Smokeless tobacco: Never  ?Vaping Use  ? Vaping Use: Never used  ?Substance and Sexual Activity  ? Alcohol use: Yes  ?  Alcohol/week: 2.0 standard drinks  ?  Types: 2 Shots of liquor per week  ?  Comment: occ  ? Drug use: No  ? Sexual activity: Not Currently  ?  Partners: Female  ?Other Topics Concern  ? Not on file  ?Social History Narrative  ? Married 42 years in 2015. No kids (mumps at age 50)  ?   ? Retired from [employer] in Engineer, technical sales.  [employer] yrs [employer]  ? Volunteers at hospital  ?   ? Hobbies: poker, movies and tv, staying active  ?   ? Right handed  ? 2 level home with steps he uses  ? ?Social Determinants of Health  ? ?Financial Resource Strain: Low Risk   ? Difficulty of Paying Living Expenses: Not hard at all  ?Food Insecurity: No Food Insecurity  ? Worried About Charity fundraiser in the Last Year: Never true  ? Ran Out of Food in the Last Year: Never true  ?Transportation Needs: No Transportation Needs  ? Lack of Transportation (Medical): No  ? Lack of Transportation (Non-Medical): No  ?Physical Activity: Inactive  ? Days of Exercise per Week: 0 days  ? Minutes of Exercise per Session: 0 min  ?Stress: No Stress Concern Present  ? Feeling of Stress : Only a little  ?Social Connections: Moderately Integrated  ? Frequency of Communication with Friends and Family: More than three times a week  ? Frequency of Social Gatherings with Friends and Family: More than three times a week  ? Attends Religious Services: Never  ? Active Member of Clubs or Organizations: Yes  ? Attends Archivist Meetings: 1 to 4 times per year  ? Marital Status: Married  ? ?Allergies  ?Allergen Reactions  ? Benadryl [Diphenhydramine] Other (See Comments)  ?  Pt told not to take because of bypass surgery  ? Bromfed Nausea And Vomiting  ? Clarithromycin Other (See Comments)  ?    gastritis  ? Codeine Other (See Comments)  ?  Trouble breathing  ? Doxycycline Hyclate Nausea And Vomiting  ? Lexapro [Escitalopram]   ?  Dizziness  ? Modafinil Other (See Comments)  ?   anxiety-nervousness  ? Oxycodone-Acetaminophen Itching  ? Promethazine Hcl Other (See Comments)  ?   fainting  ? Quinolones Nausea Only  ?  Cipro ?"felt real bad"  ? Telithromycin Nausea And Vomiting  ? Tramadol Nausea Only  ? Hydrocodone-Acetaminophen Rash  ? Statins Other (See Comments)  ?  Leg cramps and makes patient feel bad. Per pt tried multiple in years past  ? Sulfonamide Derivatives Rash  ? ?Family History  ?Problem Relation Age of Onset  ? Heart disease Mother   ? Hyperlipidemia Mother   ? Heart disease Father   ? Hyperlipidemia Father   ? Diabetes Brother   ? Colon cancer Neg Hx   ? Pancreatic cancer Neg Hx   ? Esophageal cancer Neg Hx   ? Stomach cancer Neg Hx   ? Liver cancer Neg Hx   ? ? ? ?Current Outpatient Medications (Cardiovascular):  ?  Alirocumab (PRALUENT) 150 MG/ML SOAJ, INJECT 1 DOSE INTO THE SKIN EVERY 14 (FOURTEEN) DAYS. ?  amLODipine (NORVASC) 5 MG tablet, Take 1 tablet (5 mg total) by mouth daily. ?  EPINEPHrine 0.3 mg/0.3 mL IJ SOAJ injection, Inject 0.3 mg into the muscle as needed for anaphylaxis. for allergic reaction ?  ezetimibe (ZETIA) 10 MG tablet, Take 10 mg by mouth daily. ?  hydrochlorothiazide (HYDRODIURIL) 25 MG tablet, TAKE 1 TABLET BY MOUTH EVERY DAY ?  nitroGLYCERIN (NITROSTAT) 0.4 MG SL tablet, PLACE 1 TABLET UNDER THE TONGUE EVERY 5 (FIVE) MINUTES AS NEEDED. ?  valsartan (DIOVAN) 320 MG tablet, Take 1 tablet (320 mg total) by mouth daily. ? ?Current Outpatient Medications (Respiratory):  ?  albuterol (PROAIR HFA) 108 (90 BASE) MCG/ACT inhaler, Inhale 1-2 puffs into the lungs every 6 (six) hours as needed for wheezing or shortness of breath. ?  FLOVENT HFA 110 MCG/ACT inhaler, Inhale 1 puff into the lungs 2 (two) times daily.  ?  fluticasone (FLONASE) 50 MCG/ACT nasal spray, Place 1  spray into both nostrils 2 (two) times daily.  ?  loratadine (CLARITIN) 10 MG tablet, Take 10 mg by mouth at bedtime.  (Patient not taking: Reported on 06/09/2021) ? ?Current Outpatient Medications (Analgesics):  ?

## 2021-06-19 ENCOUNTER — Telehealth: Payer: Self-pay | Admitting: Pharmacist

## 2021-06-19 ENCOUNTER — Other Ambulatory Visit (HOSPITAL_COMMUNITY): Payer: Self-pay | Admitting: Orthopaedic Surgery

## 2021-06-19 ENCOUNTER — Ambulatory Visit: Payer: PPO | Admitting: Family Medicine

## 2021-06-19 VITALS — BP 122/74 | HR 85 | Ht 66.0 in | Wt 210.0 lb

## 2021-06-19 DIAGNOSIS — N183 Chronic kidney disease, stage 3 unspecified: Secondary | ICD-10-CM | POA: Diagnosis not present

## 2021-06-19 DIAGNOSIS — M5136 Other intervertebral disc degeneration, lumbar region: Secondary | ICD-10-CM

## 2021-06-19 DIAGNOSIS — G8929 Other chronic pain: Secondary | ICD-10-CM

## 2021-06-19 DIAGNOSIS — M25559 Pain in unspecified hip: Secondary | ICD-10-CM

## 2021-06-19 DIAGNOSIS — T84039A Mechanical loosening of unspecified internal prosthetic joint, initial encounter: Secondary | ICD-10-CM

## 2021-06-19 DIAGNOSIS — M7061 Trochanteric bursitis, right hip: Secondary | ICD-10-CM | POA: Diagnosis not present

## 2021-06-19 MED ORDER — KETOROLAC TROMETHAMINE 30 MG/ML IJ SOLN
30.0000 mg | Freq: Once | INTRAMUSCULAR | Status: AC
  Administered 2021-06-19: 15 mg via INTRAMUSCULAR

## 2021-06-19 NOTE — Progress Notes (Signed)
? ? ?Chronic Care Management ?Pharmacy Assistant  ? ?Name: Joshua [name]  MRN: [id number] DOB: [birth year] ? ?Reason for Encounter: General Adherence Call ?  ? ?Recent office visits:  ?05/13/2021 OV (PCP) [name], MD; no medication changes indicated. ? ?04/14/2021 OV (Family Medicine) Tawnya Crook, MD;  Covid-residual.  CXR neg.  Increase flovent to 2 puffs bid for 1 wk, then back to 1 puff bid.  Rest, increase fluids.  Worse, let us know.   ? ?04/09/2021 VV (PCP) [name], MD; no medication changes indicated. ? ?04/04/2021 OV (Family Medicine) Tawnya Crook, MD; rx to hold Molnupiravir 200 mg  ? ?03/31/2021 OV (Family Medicine) Tawnya Crook, MD; no medication changes noted, continue amox and await culture. ? ?02/06/2021 OV (PCP) [name], MD; no medication changes indicated. ? ?01/01/2021 OV (PCP) [name], MD; no medication changes indicated. ? ?Recent consult visits:  ?06/09/2021 OV (Pulmonology) Chesley Mires, MD; no medication changes indicated. ? ?06/05/2021 OV (Sports Medicine) Lyndal Pulley, MD; no medication changes indicated. ? ?05/28/2021 OV (Neurology) Cameron Sprang, MD; no medication changes indicated. ? ?05/26/2021 OV (Cardiology) Pixie Casino, MD; no medication changes indicated. ? ?05/08/2021 OV (Sports Medicine) Lyndal Pulley, MD; no medication changes indicated. ? ?05/07/2021 Procedure Visit [facility]) Pyrtle, Lajuan Lines, MD; Colonoscopy ? ?03/28/2021 OV (Sports Medicine) Lyndal Pulley, DO;  ?Xray today ?Aquatic therapy ?Stop antihistamine try Zyrtec at night ?See me 6 weeks ? ?02/17/2021 OV (Sports Medicine) Lyndal Pulley, DO; no medication changes indicated. ? ?01/29/2021 OV [facility]) Zehr, Laban Emperor, PA-C; no medication changes indicated. ? ?01/08/2021 OV (Neurology) Cameron Sprang, MD; no medication changes indicated. ? ?12/20/2020 OV (Cardiology) Debara Pickett Nadean Corwin, MD;  We discussed the possibility of inclisiran Marion Downer).  Cost may be  an issue however we will go ahead and try to pursue that.  Is very well-tolerated with few of any side effects ? ?Hospital visits:  ?04/08/2021 ED Visit for COVID-19 ?-No medication changes noted ? ?Medications: ?Outpatient Encounter Medications as of 06/19/2021  ?Medication Sig Note  ? acetaminophen (TYLENOL) 500 MG tablet Take 1,000 mg by mouth every 6 (six) hours as needed for moderate pain or headache.   ? albuterol (PROAIR HFA) 108 (90 BASE) MCG/ACT inhaler Inhale 1-2 puffs into the lungs every 6 (six) hours as needed for wheezing or shortness of breath.   ? Alirocumab (PRALUENT) 150 MG/ML SOAJ INJECT 1 DOSE INTO THE SKIN EVERY 14 (FOURTEEN) DAYS.   ? amLODipine (NORVASC) 5 MG tablet Take 1 tablet (5 mg total) by mouth daily.   ? aspirin EC 81 MG tablet Take 1 tablet (81 mg total) by mouth 2 (two) times daily after a meal. 11/01/2020: Patient is taking only 1 tab/day  ? busPIRone (BUSPAR) 5 MG tablet Take 1 tablet (5 mg total) by mouth 2 (two) times daily as needed (anxiety). (Patient not taking: Reported on 06/09/2021)   ? cholecalciferol (VITAMIN D3) 25 MCG (1000 UNIT) tablet Take 1,000 Units by mouth daily.   ? clotrimazole-betamethasone (LOTRISONE) cream Apply 1 application topically See admin instructions. Twice daily every 4 days   ? Cyanocobalamin (B-12) 2500 MCG TABS Take 1,250 mcg by mouth daily.   ? diazepam (VALIUM) 5 MG tablet Take 1 tablet (5 mg total) by mouth at bedtime as needed (sleep).   ? EPINEPHrine 0.3 mg/0.3 mL IJ SOAJ injection Inject 0.3 mg into the muscle as needed for anaphylaxis. for allergic reaction 01/29/2021: On hand  ?  ezetimibe (ZETIA) 10 MG tablet Take 10 mg by mouth daily.   ? FLOVENT HFA 110 MCG/ACT inhaler Inhale 1 puff into the lungs 2 (two) times daily.    ? fluticasone (FLONASE) 50 MCG/ACT nasal spray Place 1 spray into both nostrils 2 (two) times daily.    ? folic acid (FOLVITE) 818 MCG tablet Take 800 mcg by mouth daily.  11/01/2020: Pt is taking 800 mcg unable to find 400  mcg.  ? gabapentin (NEURONTIN) 300 MG capsule Take 1 cap in AM, 1 cap at noon, 2 caps at bedtime   ? GEMTESA 75 MG TABS TAKE 1 TABLET BY MOUTH DAILY.   ? Glucos-Chond-Hyal Ac-Ca Fructo (MOVE FREE JOINT HEALTH ADVANCE PO) Take 1 tablet by mouth daily.   ? hydrochlorothiazide (HYDRODIURIL) 25 MG tablet TAKE 1 TABLET BY MOUTH EVERY DAY   ? ketoconazole (NIZORAL) 2 % cream Apply 1 application topically See admin instructions. Twice a day every other day   ? loratadine (CLARITIN) 10 MG tablet Take 10 mg by mouth at bedtime.  (Patient not taking: Reported on 06/09/2021)   ? Miconazole Nitrate (LOTRIMIN AF DEODORANT POWDER) 2 % AERP Apply 1 spray topically daily as needed (jock itch).   ? nitroGLYCERIN (NITROSTAT) 0.4 MG SL tablet PLACE 1 TABLET UNDER THE TONGUE EVERY 5 (FIVE) MINUTES AS NEEDED. 01/29/2021: On hand  ? nystatin-triamcinolone ointment (MYCOLOG) Apply 1 application topically See admin instructions. Twice daily every 4 days   ? pantoprazole (PROTONIX) 40 MG tablet TAKE 1 TABLET BY MOUTH EVERY DAY   ? polyethylene glycol powder (GLYCOLAX/MIRALAX) powder Take 17 g by mouth daily.   ? valsartan (DIOVAN) 320 MG tablet Take 1 tablet (320 mg total) by mouth daily.   ? Wheat Dextrin (BENEFIBER) POWD Take 1 Dose by mouth daily. 1 dose = 2 teaspoons   ? ?No facility-administered encounter medications on file as of 06/19/2021.  ? ?Patient Questions: ?Have you had any problems recently with your health? ?Patient states he has had a lot of hip and knee pain. He states he is supposed to get an epidural injection to hopefully help him with his pain. ? ?Have you had any problems with your pharmacy? ?Patient denies having any problems with his pharmacy. ? ?What issues or side effects are you having with your medications? ?Patient denies any issues or side effects from any of his medications that he is aware of. ? ?What would you like me to pass along to Leata Mouse, CPP for him to help you with?  ?Patient states his medication  Bryson Dames is expensive. He wants to apply for patient assistance program. Patient is aware that an application will be mailed out to him next week. ? ?What can we do to take care of you better? ?Patient does not have any suggestions. ? ?Care Gaps: ?Medicare Annual Wellness: Completed 05/22/2021 ?Hemoglobin A1C: 5.7% on 05/13/2021 ?Colonoscopy: Completed 05/07/2021 ? ?Future Appointments  ?Date Time Provider [place]  ?06/19/2021  2:45 PM Lyndal Pulley, DO [facility] None  ?06/26/2021 10:15 AM Marijo Sanes, PT [facility]  ?07/04/2021  9:00 AM [facility] RM 3 [facility] [address] [facility]  ?07/07/2021  8:45 AM Shelbie Hutching [facility]  ?07/11/2021 11:00 AM [name], MD [facility]  ?07/16/2021 10:30 AM Vedia Pereyra, PT [facility]  ?07/18/2021 10:30 AM Vedia Pereyra, PT [facility]  ?07/22/2021 10:15 AM Lorina Rabon, Stephani Police [facility]  ?07/24/2021 11:00 AM Lorina Rabon, Stephani Police [facility]  ?08/22/2021 11:20  AM [name], MD [facility]  ?04/27/2022 10:00 AM Cameron Sprang, MD [facility] None  ?06/04/2022  8:45 AM [facility] HEALTH COACH [facility]  ? ?Star Rating Drugs: ?Valsartan 320 mg last filled 04/29/2021 90 DS ? ?April D Calhoun, [place] ?Clinical Pharmacist Assistant ?[phone] ? ?

## 2021-06-19 NOTE — Patient Instructions (Signed)
Good to see you! ? ?See you again 6 weeks after injection (2 months) ?

## 2021-06-19 NOTE — Assessment & Plan Note (Signed)
Patient is on an ACE inhibitor and avoids any anti-inflammatories did give 50 mg of Toradol IM today to help with some pain relief. ?

## 2021-06-19 NOTE — Assessment & Plan Note (Signed)
Hopefully patient will respond to the injection.  Patient did have to reschedule secondary to miscommunication on his aspirin.  Follow-up with me again 6 weeks after the injection. ?

## 2021-06-19 NOTE — Assessment & Plan Note (Signed)
Patient given another injection today.  We will see how patient responds.  Hopefully this will give him some improvement until patient is able to have the steroid injection in the back.  I do think that this would be more beneficial.  Discussed with patient about icing regimen and home exercises.  Increase activity slowly.  Follow-up with me again in 6 to 8 weeks. ?

## 2021-06-20 ENCOUNTER — Other Ambulatory Visit: Payer: PPO

## 2021-06-20 ENCOUNTER — Encounter: Payer: Self-pay | Admitting: Family Medicine

## 2021-06-23 ENCOUNTER — Other Ambulatory Visit: Payer: Self-pay | Admitting: Internal Medicine

## 2021-06-23 DIAGNOSIS — L304 Erythema intertrigo: Secondary | ICD-10-CM | POA: Diagnosis not present

## 2021-06-23 DIAGNOSIS — D225 Melanocytic nevi of trunk: Secondary | ICD-10-CM | POA: Diagnosis not present

## 2021-06-23 DIAGNOSIS — H61001 Unspecified perichondritis of right external ear: Secondary | ICD-10-CM | POA: Diagnosis not present

## 2021-06-23 DIAGNOSIS — L821 Other seborrheic keratosis: Secondary | ICD-10-CM | POA: Diagnosis not present

## 2021-06-23 DIAGNOSIS — L300 Nummular dermatitis: Secondary | ICD-10-CM | POA: Diagnosis not present

## 2021-06-24 DIAGNOSIS — G4733 Obstructive sleep apnea (adult) (pediatric): Secondary | ICD-10-CM | POA: Diagnosis not present

## 2021-06-25 DIAGNOSIS — J3081 Allergic rhinitis due to animal (cat) (dog) hair and dander: Secondary | ICD-10-CM | POA: Diagnosis not present

## 2021-06-25 DIAGNOSIS — J3089 Other allergic rhinitis: Secondary | ICD-10-CM | POA: Diagnosis not present

## 2021-06-25 DIAGNOSIS — J301 Allergic rhinitis due to pollen: Secondary | ICD-10-CM | POA: Diagnosis not present

## 2021-06-26 ENCOUNTER — Encounter (HOSPITAL_BASED_OUTPATIENT_CLINIC_OR_DEPARTMENT_OTHER): Payer: PPO | Admitting: Physical Therapy

## 2021-06-27 ENCOUNTER — Encounter (HOSPITAL_COMMUNITY)
Admission: RE | Admit: 2021-06-27 | Discharge: 2021-06-27 | Disposition: A | Discharge status: Home / Self Care | Payer: PPO | Source: Ambulatory Visit | Attending: Orthopaedic Surgery | Admitting: Orthopaedic Surgery

## 2021-06-27 DIAGNOSIS — M1711 Unilateral primary osteoarthritis, right knee: Secondary | ICD-10-CM | POA: Diagnosis not present

## 2021-06-27 DIAGNOSIS — M25562 Pain in left knee: Secondary | ICD-10-CM | POA: Diagnosis not present

## 2021-06-27 DIAGNOSIS — T84039A Mechanical loosening of unspecified internal prosthetic joint, initial encounter: Secondary | ICD-10-CM | POA: Diagnosis not present

## 2021-06-27 DIAGNOSIS — M7989 Other specified soft tissue disorders: Secondary | ICD-10-CM | POA: Diagnosis not present

## 2021-06-27 DIAGNOSIS — Z96652 Presence of left artificial knee joint: Secondary | ICD-10-CM | POA: Diagnosis not present

## 2021-06-27 MED ORDER — TECHNETIUM TC 99M MEDRONATE IV KIT
20.0000 | PACK | Freq: Once | INTRAVENOUS | Status: AC | PRN
  Administered 2021-06-27: 20 via INTRAVENOUS

## 2021-06-30 ENCOUNTER — Telehealth: Payer: Self-pay | Admitting: Family Medicine

## 2021-07-03 NOTE — Telephone Encounter (Signed)
Patient called regarding this prescription. He said that he was not aware that the pharmacy was sending over a refill request. He wanted to know if Dr Tamala Julian wanted him to take this medication? ? ?Please advise. ? ?

## 2021-07-04 ENCOUNTER — Ambulatory Visit
Admission: RE | Admit: 2021-07-04 | Discharge: 2021-07-04 | Disposition: A | Discharge status: Home / Self Care | Payer: PPO | Source: Ambulatory Visit | Attending: Family Medicine | Admitting: Family Medicine

## 2021-07-04 DIAGNOSIS — G8929 Other chronic pain: Secondary | ICD-10-CM

## 2021-07-04 DIAGNOSIS — M47817 Spondylosis without myelopathy or radiculopathy, lumbosacral region: Secondary | ICD-10-CM | POA: Diagnosis not present

## 2021-07-04 DIAGNOSIS — M5136 Other intervertebral disc degeneration, lumbar region: Secondary | ICD-10-CM

## 2021-07-04 MED ORDER — METHYLPREDNISOLONE ACETATE 40 MG/ML INJ SUSP (RADIOLOG
80.0000 mg | Freq: Once | INTRAMUSCULAR | Status: AC
  Administered 2021-07-04: 80 mg via EPIDURAL

## 2021-07-04 MED ORDER — IOPAMIDOL (ISOVUE-M 200) INJECTION 41%
1.0000 mL | Freq: Once | INTRAMUSCULAR | Status: AC
  Administered 2021-07-04: 1 mL via EPIDURAL

## 2021-07-04 NOTE — Discharge Instructions (Signed)
Post Procedure Spinal Discharge Instruction Sheet ? ?You may resume a regular diet and any medications that you routinely take (including pain medications) unless otherwise noted by MD. ? ?No driving day of procedure. ? ?Light activity throughout the rest of the day.  Do not do any strenuous work, exercise, bending or lifting.  The day following the procedure, you can resume normal physical activity but you should refrain from exercising or physical therapy for at least three days thereafter. ? ?You may apply ice to the injection site, 20 minutes on, 20 minutes off, as needed. Do not apply ice directly to skin.  ? ? ?Common Side Effects: ? ?Headaches- take your usual medications as directed by your physician.  Increase your fluid intake.  Caffeinated beverages may be helpful.  Lie flat in bed until your headache resolves. ? ?Restlessness or inability to sleep- you may have trouble sleeping for the next few days.  Ask your referring physician if you need any medication for sleep. ? ?Facial flushing or redness- should subside within a few days. ? ?Increased pain- a temporary increase in pain a day or two following your procedure is not unusual.  Take your pain medication as prescribed by your referring physician. ? ?Leg cramps ? ?Please contact our office at [phone] for the following symptoms: ?Fever greater than 100 degrees. ?Headaches unresolved with medication after 2-3 days. ?Increased swelling, pain, or redness at injection site. ? ? ?Thank you for visiting [facility] today.  ? ?MAY RESUME ASPIRIN IMMEDIATELY AFTER PROCEDURE!  ?

## 2021-07-07 ENCOUNTER — Ambulatory Visit (HOSPITAL_BASED_OUTPATIENT_CLINIC_OR_DEPARTMENT_OTHER): Payer: PPO | Admitting: Physical Therapy

## 2021-07-07 NOTE — Telephone Encounter (Signed)
I would leave it up to him, or we could wait and see how the back injection does  ?

## 2021-07-07 NOTE — Telephone Encounter (Signed)
Sent patient MyChart message.

## 2021-07-09 ENCOUNTER — Ambulatory Visit (HOSPITAL_BASED_OUTPATIENT_CLINIC_OR_DEPARTMENT_OTHER): Payer: PPO | Admitting: Physical Therapy

## 2021-07-10 NOTE — Progress Notes (Signed)
?  ?Cardiology Office Note ? ? ?Date:  07/11/2021  ? ?ID:  Joshua [name], DOB 11-Aug-1940, MRN [id number] ? ?PCP:  [name], MD  ?Cardiologist:   [name], MD  ? ? ?Chief Complaint  ?Patient presents with  ? Chest Pain  ? ? ?  ?History of Present Illness: ?Joshua [name] is a 81 y.o. male who presents for follow up of CAD s/p CABG, HTN, HLD, pancreatitis and OSA on CPAP. She had a negative perfusion study in 2015.  He had chest pain in January 2020 and routine treadmill stress test showing no ischemia.    Due to recurrent chest discomfort, patient eventually underwent cardiac catheterization on 04/13/2019 that revealed severe triple-vessel coronary artery disease s/p 5 vessel bypass with 5 out of 5 patent bypass grafts.  The native LAD was occluded beyond the first diagonal branch.  Patent LIMA graft. The circumflex is occluded at the ostium.  The moderate caliber distal OM branch fills from the patent vein graft.  The small caliber more proximal OM fills retrograde from collaterals.  The native RCA is occluded proximally.  The vein graft to the distal RCA is patent. A DES was successfully placed to origin to proximal graft lesion saphenous graft to third marginal.  Patient had normal left ventricular systolic function.  He was discharged on aspirin and Plavix. ? ?He has been having some trouble with his back.  He got a back injection the other day and he said that since then he has not done well.  He is got foggy like headache.  He has had some sharp shooting pains in his back and his chest.  He has been fatigued.  He is not having any discomfort like he had when he had his stress test.  At that time he could not walk up the stairs without getting discomfort and is not doing that now.  He denies any new shortness of breath, PND or orthopnea.  He has had no new palpitations, presyncope or syncope ? ?Past Medical History:  ?Diagnosis Date  ? Acute medial meniscal tear   ? Anemia 2015  ? Arthritis    ? "middle finger right hand; right knee; neck" (02/06/2014)  ? Asthma   ? seasonal  ? Bladder cancer ([place]) 04/2010  ? "cauterized during prostate OR"  ? CAD (coronary artery disease)   ? CABG 2001  ? Diverticulosis   ? DJD (degenerative joint disease)   ? BACK  ? GERD (gastroesophageal reflux disease)   ? H/O inguinal hernia repair 12/2018  ? History of gout   ? Hyperlipidemia   ? Hypertension   ? IBS (irritable bowel syndrome)   ? Obesity   ? OSA (obstructive sleep apnea)   ? USES CPAP   ? Pancreatitis ~ 1980  ? Peripheral vascular disease ([place])   ? Pre-diabetes   ? Prostate cancer ([place]) 04/2010  ? ? ?Past Surgical History:  ?Procedure Laterality Date  ? ANTERIOR CERVICAL DECOMP/DISCECTOMY FUSION  05/26/2006  ? PLATE PLACED  ? BACK SURGERY    ? CARDIAC CATHETERIZATION  11/20/1999  ? CORONARY ARTERY BYPASS GRAFT  11/20/1999  ? SVG-RI1-RI2, SVG-OM, SVG-dRCA  ? CORONARY STENT INTERVENTION N/A 04/13/2019  ? Procedure: CORONARY STENT INTERVENTION;  Surgeon: Burnell Blanks, MD;  Location: [facility];  Service: Cardiovascular;  Laterality: N/A;  ? HERNIA REPAIR    ? INGUINAL HERNIA REPAIR Left 01/02/2019  ? Procedure: OPEN LEFT INGUINAL HERNIA REPAIR WITH MESH;  Surgeon:  Johnathan Hausen, MD;  Location: [facility];  Service: General;  Laterality: Left;  ? KNEE ARTHROSCOPY Right 1992; 04/2008  ? LAPAROSCOPIC CHOLECYSTECTOMY  2007  ? LEFT HEART CATH AND CORS/GRAFTS ANGIOGRAPHY N/A 04/13/2019  ? Procedure: LEFT HEART CATH AND CORS/GRAFTS ANGIOGRAPHY;  Surgeon: Burnell Blanks, MD;  Location: [facility];  Service: Cardiovascular;  Laterality: N/A;  ? NASAL SEPTUM SURGERY Right 2007  ? 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  ? ROBOT ASSISTED LAPAROSCOPIC RADICAL PROSTATECTOMY  04/2010  ? THYROIDECTOMY, PARTIAL  1980  ? 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  ? Procedure: LEFT TOTAL KNEE ARTHROPLASTY;  Surgeon: Melrose Nakayama, MD;  Location: [facility];  Service: Orthopedics;  Laterality: Left;  ? ? ? ?Current  Outpatient Medications  ?Medication Sig Dispense Refill  ? acetaminophen (TYLENOL) 500 MG tablet Take 1,000 mg by mouth every 6 (six) hours as needed for moderate pain or headache.    ? albuterol (PROAIR HFA) 108 (90 BASE) MCG/ACT inhaler Inhale 1-2 puffs into the lungs every 6 (six) hours as needed for wheezing or shortness of breath. 18 g 3  ? Alirocumab (PRALUENT) 150 MG/ML SOAJ INJECT 1 DOSE INTO THE SKIN EVERY 14 (FOURTEEN) DAYS. 2 mL 11  ? amLODipine (NORVASC) 5 MG tablet TAKE 1 TABLET (5 MG TOTAL) BY MOUTH DAILY. 90 tablet 3  ? aspirin EC 81 MG tablet Take 1 tablet (81 mg total) by mouth 2 (two) times daily after a meal. 30 tablet 11  ? busPIRone (BUSPAR) 5 MG tablet Take 1 tablet (5 mg total) by mouth 2 (two) times daily as needed (anxiety). 40 tablet 2  ? Cetirizine HCl (ZYRTEC ALLERGY) 10 MG CAPS     ? cholecalciferol (VITAMIN D3) 25 MCG (1000 UNIT) tablet Take 1,000 Units by mouth daily.    ? clotrimazole-betamethasone (LOTRISONE) cream Apply 1 application topically See admin instructions. Twice daily every 4 days    ? Cyanocobalamin (B-12) 2500 MCG TABS Take 1,250 mcg by mouth daily.    ? diazepam (VALIUM) 5 MG tablet Take 1 tablet (5 mg total) by mouth at bedtime as needed (sleep). 30 tablet 1  ? EPINEPHrine 0.3 mg/0.3 mL IJ SOAJ injection Inject 0.3 mg into the muscle as needed for anaphylaxis. for allergic reaction    ? ezetimibe (ZETIA) 10 MG tablet TAKE 1 TABLET BY MOUTH EVERY DAY 90 tablet 3  ? FLOVENT HFA 110 MCG/ACT inhaler Inhale 1 puff into the lungs 2 (two) times daily.   5  ? fluticasone (FLONASE) 50 MCG/ACT nasal spray Place 1 spray into both nostrils 2 (two) times daily.     ? folic acid (FOLVITE) 671 MCG tablet Take 800 mcg by mouth daily.     ? gabapentin (NEURONTIN) 300 MG capsule Take 1 cap in AM, 1 cap at noon, 2 caps at bedtime 360 capsule 3  ? GEMTESA 75 MG TABS TAKE 1 TABLET BY MOUTH DAILY. 30 tablet 11  ? Glucos-Chond-Hyal Ac-Ca Fructo (MOVE FREE JOINT HEALTH ADVANCE PO) Take 1  tablet by mouth daily.    ? hydrochlorothiazide (HYDRODIURIL) 25 MG tablet TAKE 1 TABLET BY MOUTH EVERY DAY 90 tablet 2  ? hydrocortisone 2.5 % cream Apply topically.    ? ketoconazole (NIZORAL) 2 % cream Apply 1 application topically See admin instructions. Twice a day every other day    ? Miconazole Nitrate (LOTRIMIN AF DEODORANT POWDER) 2 % AERP Apply 1 spray topically daily as needed (jock itch).    ? montelukast (SINGULAIR) 10 MG  tablet TAKE 1 TABLET BY MOUTH EVERYDAY AT BEDTIME 90 tablet 1  ? nitroGLYCERIN (NITROSTAT) 0.4 MG SL tablet PLACE 1 TABLET UNDER THE TONGUE EVERY 5 (FIVE) MINUTES AS NEEDED. 75 tablet 1  ? nystatin-triamcinolone ointment (MYCOLOG) Apply 1 application topically See admin instructions. Twice daily every 4 days    ? pantoprazole (PROTONIX) 40 MG tablet TAKE 1 TABLET BY MOUTH EVERY DAY 90 tablet 3  ? polyethylene glycol powder (GLYCOLAX/MIRALAX) powder Take 17 g by mouth daily. 255 g 0  ? valsartan (DIOVAN) 320 MG tablet TAKE 1 TABLET BY MOUTH EVERY DAY 90 tablet 3  ? Wheat Dextrin (BENEFIBER) POWD Take 1 Dose by mouth daily. 1 dose = 2 teaspoons    ? ?No current facility-administered medications for this visit.  ? ? ?Allergies:   Benadryl [diphenhydramine], Bromfed, Clarithromycin, Codeine, Doxycycline hyclate, Lexapro [escitalopram], Modafinil, Oxycodone-acetaminophen, Promethazine hcl, Quinolones, Telithromycin, Tramadol, Hydrocodone-acetaminophen, Statins, and Sulfonamide derivatives  ? ? ?ROS:  Please see the history of present illness.   Otherwise, review of systems are positive for none.   All other systems are reviewed and negative.  ? ? ?PHYSICAL EXAM: ?VS:  BP 130/84   Pulse 66   Ht '5\' 6"'$  (1.676 m)   Wt 206 lb 6.4 oz (93.6 kg)   SpO2 95%   BMI 33.31 kg/m?  , BMI Body mass index is 33.31 kg/m?. ?GENERAL:  Well appearing ?NECK:  No jugular venous distention, waveform within normal limits, carotid upstroke brisk and symmetric, no bruits, no thyromegaly ?LUNGS:  Clear to  auscultation bilaterally ?CHEST:  Well healed sternotomy scar. ?HEART:  PMI not displaced or sustained,S1 and S2 within normal limits, no S3, no S4, no clicks, no rubs, no murmurs ?ABD:  Flat, positive bowel sounds

## 2021-07-11 ENCOUNTER — Other Ambulatory Visit: Payer: Self-pay | Admitting: Family Medicine

## 2021-07-11 ENCOUNTER — Other Ambulatory Visit: Payer: Self-pay | Admitting: Internal Medicine

## 2021-07-11 ENCOUNTER — Ambulatory Visit: Payer: PPO | Admitting: Cardiology

## 2021-07-11 ENCOUNTER — Ambulatory Visit (HOSPITAL_BASED_OUTPATIENT_CLINIC_OR_DEPARTMENT_OTHER): Payer: Self-pay | Admitting: Physical Therapy

## 2021-07-11 ENCOUNTER — Encounter: Payer: Self-pay | Admitting: Cardiology

## 2021-07-11 VITALS — BP 130/84 | HR 66 | Ht 66.0 in | Wt 206.4 lb

## 2021-07-11 DIAGNOSIS — E785 Hyperlipidemia, unspecified: Secondary | ICD-10-CM

## 2021-07-11 DIAGNOSIS — G4733 Obstructive sleep apnea (adult) (pediatric): Secondary | ICD-10-CM | POA: Diagnosis not present

## 2021-07-11 DIAGNOSIS — I10 Essential (primary) hypertension: Secondary | ICD-10-CM | POA: Diagnosis not present

## 2021-07-11 DIAGNOSIS — J301 Allergic rhinitis due to pollen: Secondary | ICD-10-CM | POA: Diagnosis not present

## 2021-07-11 DIAGNOSIS — J3081 Allergic rhinitis due to animal (cat) (dog) hair and dander: Secondary | ICD-10-CM | POA: Diagnosis not present

## 2021-07-11 DIAGNOSIS — J3089 Other allergic rhinitis: Secondary | ICD-10-CM | POA: Diagnosis not present

## 2021-07-11 DIAGNOSIS — Z9989 Dependence on other enabling machines and devices: Secondary | ICD-10-CM

## 2021-07-11 DIAGNOSIS — I2510 Atherosclerotic heart disease of native coronary artery without angina pectoris: Secondary | ICD-10-CM

## 2021-07-11 NOTE — Patient Instructions (Signed)
Medication Instructions:  No changes *If you need a refill on your cardiac medications before your next appointment, please call your pharmacy*   Lab Work: None ordered If you have labs (blood work) drawn today and your tests are completely normal, you will receive your results only by: MyChart Message (if you have MyChart) OR A paper copy in the mail If you have any lab test that is abnormal or we need to change your treatment, we will call you to review the results.   Testing/Procedures: None ordered   Follow-Up: At [facility], you and your health needs are our priority.  As part of our continuing mission to provide you with exceptional heart care, we have created designated Provider Care Teams.  These Care Teams include your primary Cardiologist (physician) and Advanced Practice Providers (APPs -  Physician Assistants and Nurse Practitioners) who all work together to provide you with the care you need, when you need it.  We recommend signing up for the patient portal called "MyChart".  Sign up information is provided on this After Visit Summary.  MyChart is used to connect with patients for Virtual Visits (Telemedicine).  Patients are able to view lab/test results, encounter notes, upcoming appointments, etc.  Non-urgent messages can be sent to your provider as well.   To learn more about what you can do with MyChart, go to [email or web address].    Your next appointment:   12 month(s)  The format for your next appointment:   In Person  Provider:   James Hochrein, MD {   Important Information About Sugar       

## 2021-07-14 DIAGNOSIS — M25552 Pain in left hip: Secondary | ICD-10-CM | POA: Diagnosis not present

## 2021-07-14 DIAGNOSIS — M1612 Unilateral primary osteoarthritis, left hip: Secondary | ICD-10-CM | POA: Diagnosis not present

## 2021-07-15 DIAGNOSIS — J3089 Other allergic rhinitis: Secondary | ICD-10-CM | POA: Diagnosis not present

## 2021-07-15 DIAGNOSIS — J3081 Allergic rhinitis due to animal (cat) (dog) hair and dander: Secondary | ICD-10-CM | POA: Diagnosis not present

## 2021-07-15 DIAGNOSIS — J301 Allergic rhinitis due to pollen: Secondary | ICD-10-CM | POA: Diagnosis not present

## 2021-07-16 ENCOUNTER — Ambulatory Visit (HOSPITAL_BASED_OUTPATIENT_CLINIC_OR_DEPARTMENT_OTHER): Payer: Self-pay | Admitting: Physical Therapy

## 2021-07-18 ENCOUNTER — Ambulatory Visit (HOSPITAL_BASED_OUTPATIENT_CLINIC_OR_DEPARTMENT_OTHER): Payer: Self-pay | Admitting: Physical Therapy

## 2021-07-22 ENCOUNTER — Ambulatory Visit (HOSPITAL_BASED_OUTPATIENT_CLINIC_OR_DEPARTMENT_OTHER): Payer: Self-pay | Admitting: Physical Therapy

## 2021-07-23 DIAGNOSIS — J3089 Other allergic rhinitis: Secondary | ICD-10-CM | POA: Diagnosis not present

## 2021-07-23 DIAGNOSIS — J3081 Allergic rhinitis due to animal (cat) (dog) hair and dander: Secondary | ICD-10-CM | POA: Diagnosis not present

## 2021-07-23 DIAGNOSIS — J301 Allergic rhinitis due to pollen: Secondary | ICD-10-CM | POA: Diagnosis not present

## 2021-07-24 ENCOUNTER — Telehealth: Payer: Self-pay

## 2021-07-24 ENCOUNTER — Encounter: Payer: Self-pay | Admitting: Family Medicine

## 2021-07-24 ENCOUNTER — Ambulatory Visit (HOSPITAL_BASED_OUTPATIENT_CLINIC_OR_DEPARTMENT_OTHER): Payer: Self-pay | Admitting: Physical Therapy

## 2021-07-24 DIAGNOSIS — G4733 Obstructive sleep apnea (adult) (pediatric): Secondary | ICD-10-CM | POA: Diagnosis not present

## 2021-07-24 NOTE — Telephone Encounter (Signed)
Pt states he had COVID-19 in January and dealt with a feeling of a ball in his throat. ?Pt states he was able to cough out phlegm and the sensation left.  ?Two weeks ago the feeling of "ball in the throat" came back and is causing difficulty with swallowing. ? ?Pt was transferred to the nurse triage line. ?

## 2021-07-24 NOTE — Telephone Encounter (Signed)
Patient Name: Joshua [name] Gender: Male DOB: Feb 17, 1941 Age: 81 Y 58 M 11 D Return Phone Number: [phone] (Primary) Address: City/ State/ Zip: [place]  [address] Client [facility] [place] [facility] [place] [facility] Provider [name]- MD Contact Type Call Who Is Calling Patient / Member / Family / Caregiver Call Type Triage / Clinical Relationship To Patient Self Return Phone Number [phone] (Primary) Chief Complaint Swallowing Difficulty Reason for Call Symptomatic / Request for [place] states that they have a problem swallowing. Translation No Nurse Assessment Nurse: Doyle Askew, RN, Beth Date/Time (Eastern Time): 07/24/2021 2:58:19 PM Confirm and document reason for call. If symptomatic, describe symptoms. ---Caller states that they have a problem swallowing. Caller states started about a week ago. Does the patient have any new or worsening symptoms? ---Yes Will a triage be completed? ---Yes Related visit to physician within the last 2 weeks? ---No Does the PT have any chronic conditions? (i.e. diabetes, asthma, this includes High risk factors for pregnancy, etc.) ---Yes List chronic conditions. ---allergies, asthma, cardiac Is this a behavioral health or substance abuse call? ---No Guidelines Guideline Title Affirmed Question Affirmed Notes Nurse Date/Time (Eastern Time) Swallowing Difficulty Symptoms of food or bone stuck in throat or esophagus (e.g., pain in throat or chest, FB sensation, blood-tinged saliva) Doyle Askew, RN, [facility] 07/24/2021 2:59:17 PM Disp. Time Eilene Ghazi Time) Disposition Final User 07/24/2021 3:03:04 PM Go to ED Now Yes Doyle Askew, RN, Beth  Caller Disagree/Comply Disagree Caller Understands Yes PreDisposition InappropriateToAsk Care Advice Given Per Guideline GO TO ED NOW: * You need to be seen in the Emergency Department. * Go to the ED  at ___________ [place] now. Drive carefully. NOTHING BY MOUTH: * Do not eat or drink anything for now. CARE ADVICE given per Swallowed Difficulty (Adult) guideline. Comments User: Melene Muller, RN Date/Time Eilene Ghazi Time): 07/24/2021 3:02:59 PM Tried to reach backline to notify of caller refusal to go to ED - no answer Referrals REFERRED TO PCP OFFICE

## 2021-07-25 NOTE — Telephone Encounter (Signed)
Schedule f/u visit for pt if needed.  ?

## 2021-08-01 NOTE — Progress Notes (Signed)
?Joshua [name] D.O. ?[facility] ?[place] ?Phone: [phone] ?Subjective:   ?I, Joshua [name], am serving as a scribe for Dr. Hulan Saas. ? ?This visit occurred during the SARS-CoV-2 public health emergency.  Safety protocols were in place, including screening questions prior to the visit, additional usage of staff PPE, and extensive cleaning of exam room while observing appropriate contact time as indicated for disinfecting solutions.  ? ?I'm seeing this patient by the request  of:  Joshua Olp, MD ? ?CC: Back pain and hip pain ? ?NGE:[id number]  ?06/19/2021 ?Patient is on an ACE inhibitor and avoids any anti-inflammatories did give 50 mg of Toradol IM today to help with some pain relief. ? ?Hopefully patient will respond to the injection.  Patient did have to reschedule secondary to miscommunication on his aspirin.  Follow-up with me again 6 weeks after the injection. ? ?Patient given another injection today.  We will see how patient responds.  Hopefully this will give him some improvement until patient is able to have the steroid injection in the back.  I do think that this would be more beneficial.  Discussed with patient about icing regimen and home exercises.  Increase activity slowly.  Follow-up with me again in 6 to 8 weeks. ? ?Updated 08/05/2021 ?Joshua [name] is a 81 y.o. male coming in with complaint of right hip and LBP. Epi on 07/04/2021. Patient states that he was sick from steroid injection. Had only 1 week of relief. Patient had injection in L hip injection from Dr. Rip Harbour which helped. Unsure of what was injected as he declined a steroid injection due to sickness after epidural.  ? ? ? ?  ? ?Past Medical History:  ?Diagnosis Date  ? Acute medial meniscal tear   ? Anemia 2015  ? Arthritis   ? "middle finger right hand; right knee; neck" (02/06/2014)  ? Asthma   ? seasonal  ? 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 "cauterized during prostate OR"  ? CAD  (coronary artery disease)   ? CABG 2001  ? Diverticulosis   ? DJD (degenerative joint disease)   ? BACK  ? GERD (gastroesophageal reflux disease)   ? H/O inguinal hernia repair 12/2018  ? History of gout   ? Hyperlipidemia   ? Hypertension   ? IBS (irritable bowel syndrome)   ? Obesity   ? OSA (obstructive sleep apnea)   ? USES CPAP   ? Pancreatitis ~ 1980  ? Peripheral vascular disease ([place])   ? Pre-diabetes   ? Prostate cancer ([place]) 04/2010  ? ?Past Surgical History:  ?Procedure Laterality Date  ? ANTERIOR CERVICAL DECOMP/DISCECTOMY FUSION  05/26/2006  ? PLATE PLACED  ? BACK SURGERY    ? CARDIAC CATHETERIZATION  11/20/1999  ? CORONARY ARTERY BYPASS GRAFT  11/20/1999  ? SVG-RI1-RI2, SVG-OM, SVG-dRCA  ? CORONARY STENT INTERVENTION N/A 04/13/2019  ? Procedure: CORONARY STENT INTERVENTION;  Surgeon: Burnell Blanks, MD;  Location: [facility];  Service: Cardiovascular;  Laterality: N/A;  ? HERNIA REPAIR    ? INGUINAL HERNIA REPAIR Left 01/02/2019  ? Procedure: OPEN LEFT INGUINAL HERNIA REPAIR WITH MESH;  Surgeon: Johnathan Hausen, MD;  [facility];  Service: General;  Laterality: Left;  ? KNEE ARTHROSCOPY Right 1992; 04/2008  ? LAPAROSCOPIC CHOLECYSTECTOMY  2007  ? LEFT HEART CATH AND CORS/GRAFTS ANGIOGRAPHY N/A 04/13/2019  ? Procedure: LEFT HEART CATH AND CORS/GRAFTS ANGIOGRAPHY;  Surgeon: Burnell Blanks, MD;  Location: [facility]  INVASIVE [facility];  Service: Cardiovascular;  Laterality: N/A;  ? NASAL SEPTUM SURGERY Right 2007  ? 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  ? ROBOT ASSISTED LAPAROSCOPIC RADICAL PROSTATECTOMY  04/2010  ? THYROIDECTOMY, PARTIAL  1980  ? 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  ? Procedure: LEFT TOTAL KNEE ARTHROPLASTY;  Surgeon: Melrose Nakayama, MD;  [facility];  Service: Orthopedics;  Laterality: Left;  ? ?Social History  ? ?Socioeconomic History  ? Marital status: Married  ?  Spouse name: Not on file  ? Number of children: Not on file  ? Years of education: Not on file  ?  Highest education level: Not on file  ?Occupational History  ? Occupation: retired  ?  Employer: RETIRED  ?Tobacco Use  ? Smoking status: Former  ?  Packs/day: 1.00  ?  Years: 35.00  ?  Pack years: 35.00  ?  Types: Cigarettes  ?  Quit date: 06/16/1992  ?  Years since quitting: 29.1  ? Smokeless tobacco: Never  ?Vaping Use  ? Vaping Use: Never used  ?Substance and Sexual Activity  ? Alcohol use: Yes  ?  Alcohol/week: 2.0 standard drinks  ?  Types: 2 Shots of liquor per week  ?  Comment: occ  ? Drug use: No  ? Sexual activity: Not Currently  ?  Partners: Female  ?Other Topics Concern  ? Not on file  ?Social History Narrative  ? Married 42 years in 2015. No kids (mumps at age 22)  ?   ? Retired from [employer] in Engineer, technical sales.  [employer] yrs [employer]  ? Volunteers at hospital  ?   ? Hobbies: poker, movies and tv, staying active  ?   ? Right handed  ? 2 level home with steps he uses  ? ?Social Determinants of Health  ? ?Financial Resource Strain: Low Risk   ? Difficulty of Paying Living Expenses: Not hard at all  ?Food Insecurity: No Food Insecurity  ? Worried About Charity fundraiser in the Last Year: Never true  ? Ran Out of Food in the Last Year: Never true  ?Transportation Needs: No Transportation Needs  ? Lack of Transportation (Medical): No  ? Lack of Transportation (Non-Medical): No  ?Physical Activity: Inactive  ? Days of Exercise per Week: 0 days  ? Minutes of Exercise per Session: 0 min  ?Stress: No Stress Concern Present  ? Feeling of Stress : Only a little  ?Social Connections: Moderately Integrated  ? Frequency of Communication with Friends and Family: More than three times a week  ? Frequency of Social Gatherings with Friends and Family: More than three times a week  ? Attends Religious Services: Never  ? Active Member of Clubs or Organizations: Yes  ? Attends Archivist Meetings: 1 to 4 times per year  ? Marital Status: Married  ? ?Allergies  ?Allergen Reactions  ? Benadryl [Diphenhydramine] Other (See  Comments)  ?  Pt told not to take because of bypass surgery  ? Bromfed Nausea And Vomiting  ? Clarithromycin Other (See Comments)  ?   gastritis  ? Codeine Other (See Comments)  ?  Trouble breathing  ? Doxycycline Hyclate Nausea And Vomiting  ? Lexapro [Escitalopram]   ?  Dizziness  ? Modafinil Other (See Comments)  ?   anxiety-nervousness  ? Oxycodone-Acetaminophen Itching  ? Promethazine Hcl Other (See Comments)  ?   fainting  ? Quinolones Nausea Only  ?  Cipro ?"felt real bad"  ? Telithromycin Nausea And  Vomiting  ? Tramadol Nausea Only  ? Hydrocodone-Acetaminophen Rash  ? Statins Other (See Comments)  ?  Leg cramps and makes patient feel bad. Per pt tried multiple in years past  ? Sulfonamide Derivatives Rash  ? ?Family History  ?Problem Relation Age of Onset  ? Heart disease Mother   ? Hyperlipidemia Mother   ? Heart disease Father   ? Hyperlipidemia Father   ? Diabetes Brother   ? Colon cancer Neg Hx   ? Pancreatic cancer Neg Hx   ? Esophageal cancer Neg Hx   ? Stomach cancer Neg Hx   ? Liver cancer Neg Hx   ? ? ? ?Current Outpatient Medications (Cardiovascular):  ?  Alirocumab (PRALUENT) 150 MG/ML SOAJ, INJECT 1 DOSE INTO THE SKIN EVERY 14 (FOURTEEN) DAYS. ?  amLODipine (NORVASC) 5 MG tablet, TAKE 1 TABLET (5 MG TOTAL) BY MOUTH DAILY. ?  EPINEPHrine 0.3 mg/0.3 mL IJ SOAJ injection, Inject 0.3 mg into the muscle as needed for anaphylaxis. for allergic reaction ?  ezetimibe (ZETIA) 10 MG tablet, TAKE 1 TABLET BY MOUTH EVERY DAY ?  hydrochlorothiazide (HYDRODIURIL) 25 MG tablet, TAKE 1 TABLET BY MOUTH EVERY DAY ?  nitroGLYCERIN (NITROSTAT) 0.4 MG SL tablet, PLACE 1 TABLET UNDER THE TONGUE EVERY 5 (FIVE) MINUTES AS NEEDED. ?  valsartan (DIOVAN) 320 MG tablet, TAKE 1 TABLET BY MOUTH EVERY DAY ? ?Current Outpatient Medications (Respiratory):  ?  albuterol (PROAIR HFA) 108 (90 BASE) MCG/ACT inhaler, Inhale 1-2 puffs into the lungs every 6 (six) hours as needed for wheezing or shortness of breath. ?  FLOVENT HFA 110  MCG/ACT inhaler, Inhale 1 puff into the lungs 2 (two) times daily.  ?  fluticasone (FLONASE) 50 MCG/ACT nasal spray, Place 1 spray into both nostrils 2 (two) times daily.  ?  montelukast (SINGULAIR) 10 MG tabl

## 2021-08-05 ENCOUNTER — Ambulatory Visit (INDEPENDENT_AMBULATORY_CARE_PROVIDER_SITE_OTHER): Payer: PPO | Admitting: Family Medicine

## 2021-08-05 ENCOUNTER — Encounter: Payer: Self-pay | Admitting: Family Medicine

## 2021-08-05 ENCOUNTER — Ambulatory Visit: Payer: PPO | Admitting: Family Medicine

## 2021-08-05 VITALS — BP 120/60 | HR 87 | Temp 98.1°F | Ht 66.0 in | Wt 207.8 lb

## 2021-08-05 DIAGNOSIS — N183 Chronic kidney disease, stage 3 unspecified: Secondary | ICD-10-CM | POA: Diagnosis not present

## 2021-08-05 DIAGNOSIS — M5442 Lumbago with sciatica, left side: Secondary | ICD-10-CM

## 2021-08-05 DIAGNOSIS — G8929 Other chronic pain: Secondary | ICD-10-CM

## 2021-08-05 DIAGNOSIS — K219 Gastro-esophageal reflux disease without esophagitis: Secondary | ICD-10-CM | POA: Diagnosis not present

## 2021-08-05 DIAGNOSIS — I10 Essential (primary) hypertension: Secondary | ICD-10-CM

## 2021-08-05 DIAGNOSIS — M7062 Trochanteric bursitis, left hip: Secondary | ICD-10-CM

## 2021-08-05 NOTE — Progress Notes (Signed)
?Phone [phone] ?In person visit ?  ?Subjective:  ? ?Joshua [name] is a 81 y.o. year old very pleasant male patient who presents for/with See problem oriented charting ?Chief Complaint  ?Patient presents with  ? Follow-up  ?  Pt here to f/u on swallowing issues.   ? Fall  ?  Pt fell last Friday and has a scar on his arm.  ? Cough  ? Urinary Incontinence  ?  Pt states he is still having incontinence issues and he does not like the 2 options that [facility] gave him.  ? ?Past Medical History-  ?Patient Active Problem List  ? Diagnosis Date Noted  ? Coronary atherosclerosis 10/12/2006  ?  Priority: High  ? Irritable bowel syndrome 10/12/2006  ?  Priority: High  ? Overactive bladder 11/24/2019  ?  Priority: Medium   ? Hyperglycemia 10/07/2017  ?  Priority: Medium   ? Gout 11/20/2016  ?  Priority: Medium   ? CKD (chronic kidney disease), stage III ([place]) 10/23/2013  ?  Priority: Medium   ? Neuropathy ([place]) 08/21/2013  ?  Priority: Medium   ? History of bladder cancer 04/01/2010  ?  Priority: Medium   ? Essential hypertension 02/21/2007  ?  Priority: Medium   ? Hyperlipidemia 10/12/2006  ?  Priority: Medium   ? Major depression in full remission ([place]) 10/12/2006  ?  Priority: Medium   ? Asthma 10/12/2006  ?  Priority: Medium   ? Aortic atherosclerosis ([place]) 05/05/2018  ?  Priority: Low  ? DJD (degenerative joint disease)   ?  Priority: Low  ? Arthritis   ?  Priority: Low  ? Neck pain 02/08/2017  ?  Priority: Low  ? Venous insufficiency 12/29/2016  ?  Priority: Low  ? Ganglion cyst of tendon sheath of right hand 12/29/2016  ?  Priority: Low  ? Avulsion fracture of metatarsal bone of right foot 11/25/2016  ?  Priority: Low  ? Obesity, Class I, BMI 30-34.9 10/03/2015  ?  Priority: Low  ? Allergic rhinitis 05/31/2014  ?  Priority: Low  ? Fatigue 11/09/2013  ?  Priority: Low  ? Dizziness 08/23/2013  ?  Priority: Low  ? RLS (restless legs syndrome) 01/03/2013  ?  Priority: Low  ? Chronic bilateral low back pain with  left-sided sciatica 10/09/2011  ?  Priority: Low  ? OSTEOARTHRITIS, LOWER LEG, LEFT 10/04/2009  ?  Priority: Low  ? ANEMIA, IRON DEFICIENCY 11/06/2008  ?  Priority: Low  ? 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  Priority: Low  ? OSA (obstructive sleep apnea) 11/02/2006  ?  Priority: Low  ? GERD 10/12/2006  ?  Priority: Low  ? Greater trochanteric bursitis of right hip 02/17/2021  ? Hyperactive bowel sounds 01/29/2021  ? Bloating 12/27/2020  ? 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 Primary osteoarthritis of left knee 06/18/2020  ? Pre-operative respiratory examination 06/05/2020  ? Coronary artery disease involving coronary bypass graft of native heart without angina pectoris 04/07/2020  ? Trigger thumb, left thumb 01/24/2020  ? Left lateral epicondylitis 09/05/2019  ? Degenerative arthritis of left knee 08/08/2019  ? Educated about COVID-19 virus infection 04/20/2019  ? Unstable angina ([facility])   ? Greater trochanteric bursitis of left hip 10/27/2018  ? Degenerative disc disease, lumbar 10/27/2018  ? Abdominal muscle strain, initial encounter 06/16/2018  ? Weakness of left leg 06/21/2017  ? Hereditary and idiopathic peripheral neuropathy 09/25/2014  ? Generalized abdominal pain 01/31/2009  ? ? ?  Medications- reviewed and updated ?Current Outpatient Medications  ?Medication Sig Dispense Refill  ? acetaminophen (TYLENOL) 500 MG tablet Take 1,000 mg by mouth every 6 (six) hours as needed for moderate pain or headache.    ? albuterol (PROAIR HFA) 108 (90 BASE) MCG/ACT inhaler Inhale 1-2 puffs into the lungs every 6 (six) hours as needed for wheezing or shortness of breath. 18 g 3  ? Alirocumab (PRALUENT) 150 MG/ML SOAJ INJECT 1 DOSE INTO THE SKIN EVERY 14 (FOURTEEN) DAYS. 2 mL 11  ? amLODipine (NORVASC) 5 MG tablet TAKE 1 TABLET (5 MG TOTAL) BY MOUTH DAILY. 90 tablet 3  ? aspirin EC 81 MG tablet Take 1 tablet (81 mg total) by mouth 2 (two) times daily after a meal. 30 tablet 11  ? busPIRone (BUSPAR)  5 MG tablet Take 1 tablet (5 mg total) by mouth 2 (two) times daily as needed (anxiety). 40 tablet 2  ? cholecalciferol (VITAMIN D3) 25 MCG (1000 UNIT) tablet Take 1,000 Units by mouth daily.    ? clotrimazole-betamethasone (LOTRISONE) cream Apply 1 application topically See admin instructions. Twice daily every 4 days    ? Cyanocobalamin (B-12) 2500 MCG TABS Take 1,250 mcg by mouth daily.    ? diazepam (VALIUM) 5 MG tablet Take 1 tablet (5 mg total) by mouth at bedtime as needed (sleep). 30 tablet 1  ? EPINEPHrine 0.3 mg/0.3 mL IJ SOAJ injection Inject 0.3 mg into the muscle as needed for anaphylaxis. for allergic reaction    ? ezetimibe (ZETIA) 10 MG tablet TAKE 1 TABLET BY MOUTH EVERY DAY 90 tablet 3  ? FLOVENT HFA 110 MCG/ACT inhaler Inhale 1 puff into the lungs 2 (two) times daily.   5  ? fluticasone (FLONASE) 50 MCG/ACT nasal spray Place 1 spray into both nostrils 2 (two) times daily.     ? folic acid (FOLVITE) 782 MCG tablet Take 800 mcg by mouth daily.     ? gabapentin (NEURONTIN) 300 MG capsule Take 1 cap in AM, 1 cap at noon, 2 caps at bedtime 360 capsule 3  ? GEMTESA 75 MG TABS TAKE 1 TABLET BY MOUTH DAILY. 30 tablet 11  ? Glucos-Chond-Hyal Ac-Ca Fructo (MOVE FREE JOINT HEALTH ADVANCE PO) Take 1 tablet by mouth daily.    ? hydrochlorothiazide (HYDRODIURIL) 25 MG tablet TAKE 1 TABLET BY MOUTH EVERY DAY 90 tablet 2  ? hydrocortisone 2.5 % cream Apply topically.    ? ketoconazole (NIZORAL) 2 % cream Apply 1 application topically See admin instructions. Twice a day every other day    ? Miconazole Nitrate (LOTRIMIN AF DEODORANT POWDER) 2 % AERP Apply 1 spray topically daily as needed (jock itch).    ? montelukast (SINGULAIR) 10 MG tablet TAKE 1 TABLET BY MOUTH EVERYDAY AT BEDTIME 90 tablet 1  ? nitroGLYCERIN (NITROSTAT) 0.4 MG SL tablet PLACE 1 TABLET UNDER THE TONGUE EVERY 5 (FIVE) MINUTES AS NEEDED. 75 tablet 1  ? nystatin-triamcinolone ointment (MYCOLOG) Apply 1 application topically See admin instructions.  Twice daily every 4 days    ? pantoprazole (PROTONIX) 40 MG tablet TAKE 1 TABLET BY MOUTH EVERY DAY 90 tablet 3  ? polyethylene glycol powder (GLYCOLAX/MIRALAX) powder Take 17 g by mouth daily. 255 g 0  ? valsartan (DIOVAN) 320 MG tablet TAKE 1 TABLET BY MOUTH EVERY DAY 90 tablet 3  ? Wheat Dextrin (BENEFIBER) POWD Take 1 Dose by mouth daily. 1 dose = 2 teaspoons    ? ?No current facility-administered medications for this visit.  ? ?  ?  Objective:  ?BP 120/60   Pulse 87   Temp 98.1 ?F (36.7 ?C)   Ht '5\' 6"'$  (1.676 m)   Wt 207 lb 12.8 oz (94.3 kg)   SpO2 96%   BMI 33.54 kg/m?  ?Gen: NAD, resting comfortably ?CV: RRR no murmurs rubs or gallops ?Lungs: CTAB no crackles, wheeze, rhonchi ?Ext: trace edema ?Skin: warm, dry ?  ? ?Assessment and Plan  ? ?#Fall with injury ?S: Patient fell last Friday-he reports has trouble with knees and off balance plus has neuropathy- he had leg up tying shoe in hotel- tried to turn quickly to get into elevator.  He has a rug burn  on his right arm- landed on both arms- no wrist pain. Only mild worsening knee pain . ?-Tdap in 2017 ?A/P: there is a 3 x 2 cm wound on right elbow which he has well dressed. We replaced bandage and applied some bacitracin. Can change every 24 hours.   ? ?#Incontinence/overactive bladder- in patient with history of prostate/bladder cancer fully treated ?S:Issues date back to prostatectomy and bladder cancer cautery in 2012.  Worsening symptoms 2021 and wanted to try medication-recommended against oxybutynin due to beers list and potential increased fatigue-opted to try Myrbetriq-expensive and minimally effective.  ? ?From prior notes "#OAB- mybetriq not helpful. Gemtessa 75% improvement!" ? ?Today he reports-still gets some incontinence in gushes as well as dripping. Dr. He saw at [facility] gave a few options-  he does not want to pursue these ?A/P: imperfect but still overall improved control- limited options  and wants to continue current medicine- on max  dose ? ?#coughing spell/GERD ?S: continues to have coughing spell at lunchtime. Thought it could be applies. Was worse with chocolate. Slightly better without apples. Still takes pantoprazole 40 mg before break

## 2021-08-05 NOTE — Assessment & Plan Note (Signed)
Discussed HEP discussed also the possibility of PRP.  Patient wants to avoid any more steroid injections if possible.  Follow-up again if he does want to do PRP.  Total time reviewing chart and discussing with patient going over allergies 32 minutes ?

## 2021-08-05 NOTE — Patient Instructions (Signed)
Congrats on 52 years ?Read on PRP ?Stay active where you can ?See me when you need me ?

## 2021-08-05 NOTE — Assessment & Plan Note (Signed)
Patient does have some mild loss of lordosis.  Patient continues to have discomfort.  Not responding to the epidurals anymore at this point.  We will consider the possibility of PRP more into the hip area.  Patient had more improvement from an outside facility with an injection there.  Follow-up with me again after discussing with his wife about PRP. ?

## 2021-08-05 NOTE — Patient Instructions (Addendum)
I wonder if coughing is reflux related- could try pepcid/famotidine 10 mg or 20 mg before lunch to see if that helps ?-also make sure to chew very well and eat slowly ?-if worsens- could refer to GI for their opinion ? ?Change dressing daily on right arm- hope this improves in 1-2 weeks ? 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 ?Recommended follow up: reschedule June appointment - push out a month or so at the desk ?

## 2021-08-07 DIAGNOSIS — J3081 Allergic rhinitis due to animal (cat) (dog) hair and dander: Secondary | ICD-10-CM | POA: Diagnosis not present

## 2021-08-07 DIAGNOSIS — J3089 Other allergic rhinitis: Secondary | ICD-10-CM | POA: Diagnosis not present

## 2021-08-07 DIAGNOSIS — J301 Allergic rhinitis due to pollen: Secondary | ICD-10-CM | POA: Diagnosis not present

## 2021-08-13 ENCOUNTER — Encounter: Payer: Self-pay | Admitting: Family Medicine

## 2021-08-15 DIAGNOSIS — M205X2 Other deformities of toe(s) (acquired), left foot: Secondary | ICD-10-CM | POA: Diagnosis not present

## 2021-08-15 DIAGNOSIS — M205X1 Other deformities of toe(s) (acquired), right foot: Secondary | ICD-10-CM | POA: Diagnosis not present

## 2021-08-15 DIAGNOSIS — L600 Ingrowing nail: Secondary | ICD-10-CM | POA: Diagnosis not present

## 2021-08-15 DIAGNOSIS — I70203 Unspecified atherosclerosis of native arteries of extremities, bilateral legs: Secondary | ICD-10-CM | POA: Diagnosis not present

## 2021-08-15 DIAGNOSIS — L03032 Cellulitis of left toe: Secondary | ICD-10-CM | POA: Diagnosis not present

## 2021-08-20 DIAGNOSIS — J301 Allergic rhinitis due to pollen: Secondary | ICD-10-CM | POA: Diagnosis not present

## 2021-08-20 DIAGNOSIS — J3081 Allergic rhinitis due to animal (cat) (dog) hair and dander: Secondary | ICD-10-CM | POA: Diagnosis not present

## 2021-08-20 DIAGNOSIS — J3089 Other allergic rhinitis: Secondary | ICD-10-CM | POA: Diagnosis not present

## 2021-08-22 ENCOUNTER — Ambulatory Visit: Payer: PPO | Admitting: Family Medicine

## 2021-08-22 ENCOUNTER — Telehealth: Payer: Self-pay | Admitting: Family Medicine

## 2021-08-22 ENCOUNTER — Other Ambulatory Visit: Payer: Self-pay | Admitting: Family Medicine

## 2021-08-22 NOTE — Telephone Encounter (Signed)
Pt would like to talk to you regarding a medication called Gemtesa. Asking for a call back

## 2021-08-25 ENCOUNTER — Telehealth: Payer: Self-pay | Admitting: Pharmacist

## 2021-08-25 NOTE — Telephone Encounter (Signed)
Contacted patient to discuss options for financial assistance.

## 2021-08-25 NOTE — Progress Notes (Signed)
Chronic Care Management Pharmacy Assistant   Name: Joshua [name]  MRN: [id number] DOB: [birth year]  Reason for Encounter: Hypertension Adherence Call    Recent office visits:  08/05/2021 [facility] (PCP) [name], MD; no medication changes indicated.  Recent consult visits:  08/05/2021 [facility] (Sports Medicine) Lyndal Pulley, DO; no medication changes indicated.  07/11/2021 [facility] (Cardiology) [name], MD; no medication changes indicated.  06/19/2021 [facility] (Sports Medicine) Lyndal Pulley, DO; no medication changes indicated.  Hospital visits:  None since last adherence call  Medications: Outpatient Encounter Medications as of 08/25/2021  Medication Sig Note   acetaminophen (TYLENOL) 500 MG tablet Take 1,000 mg by mouth every 6 (six) hours as needed for moderate pain or headache.    albuterol (PROAIR HFA) 108 (90 BASE) MCG/ACT inhaler Inhale 1-2 puffs into the lungs every 6 (six) hours as needed for wheezing or shortness of breath.    Alirocumab (PRALUENT) 150 MG/ML SOAJ INJECT 1 DOSE INTO THE SKIN EVERY 14 (FOURTEEN) DAYS.    amLODipine (NORVASC) 5 MG tablet TAKE 1 TABLET (5 MG TOTAL) BY MOUTH DAILY.    aspirin EC 81 MG tablet Take 1 tablet (81 mg total) by mouth 2 (two) times daily after a meal. 11/01/2020: Patient is taking only 1 tab/day   busPIRone (BUSPAR) 5 MG tablet Take 1 tablet (5 mg total) by mouth 2 (two) times daily as needed (anxiety).    cholecalciferol (VITAMIN D3) 25 MCG (1000 UNIT) tablet Take 1,000 Units by mouth daily.    clotrimazole-betamethasone (LOTRISONE) cream Apply 1 application topically See admin instructions. Twice daily every 4 days    Cyanocobalamin (B-12) 2500 MCG TABS Take 1,250 mcg by mouth daily.    diazepam (VALIUM) 5 MG tablet Take 1 tablet (5 mg total) by mouth at bedtime as needed (sleep).    EPINEPHrine 0.3 mg/0.3 mL IJ SOAJ injection Inject 0.3 mg into the muscle as needed for anaphylaxis. for allergic reaction 01/29/2021: On hand    ezetimibe (ZETIA) 10 MG tablet TAKE 1 TABLET BY MOUTH EVERY DAY    FLOVENT HFA 110 MCG/ACT inhaler Inhale 1 puff into the lungs 2 (two) times daily.     fluticasone (FLONASE) 50 MCG/ACT nasal spray Place 1 spray into both nostrils 2 (two) times daily.     folic acid (FOLVITE) 683 MCG tablet Take 800 mcg by mouth daily.  11/01/2020: Pt is taking 800 mcg unable to find 400 mcg.   gabapentin (NEURONTIN) 300 MG capsule Take 1 cap in AM, 1 cap at noon, 2 caps at bedtime    GEMTESA 75 MG TABS TAKE 1 TABLET BY MOUTH DAILY.    Glucos-Chond-Hyal Ac-Ca Fructo (MOVE FREE JOINT HEALTH ADVANCE PO) Take 1 tablet by mouth daily.    hydrochlorothiazide (HYDRODIURIL) 25 MG tablet TAKE 1 TABLET BY MOUTH EVERY DAY    hydrocortisone 2.5 % cream Apply topically.    ketoconazole (NIZORAL) 2 % cream Apply 1 application topically See admin instructions. Twice a day every other day    Miconazole Nitrate (LOTRIMIN AF DEODORANT POWDER) 2 % AERP Apply 1 spray topically daily as needed (jock itch).    montelukast (SINGULAIR) 10 MG tablet TAKE 1 TABLET BY MOUTH EVERYDAY AT BEDTIME    nitroGLYCERIN (NITROSTAT) 0.4 MG SL tablet PLACE 1 TABLET UNDER THE TONGUE EVERY 5 (FIVE) MINUTES AS NEEDED. 01/29/2021: On hand   nystatin-triamcinolone ointment (MYCOLOG) Apply 1 application topically See admin instructions. Twice daily every 4 days    pantoprazole (PROTONIX)  40 MG tablet TAKE 1 TABLET BY MOUTH EVERY DAY    polyethylene glycol powder (GLYCOLAX/MIRALAX) powder Take 17 g by mouth daily.    valsartan (DIOVAN) 320 MG tablet TAKE 1 TABLET BY MOUTH EVERY DAY    Wheat Dextrin (BENEFIBER) POWD Take 1 Dose by mouth daily. 1 dose = 2 teaspoons    No facility-administered encounter medications on file as of 08/25/2021.   Reviewed chart prior to disease state call. Spoke with patient regarding BP  Recent Office Vitals: BP Readings from Last 3 Encounters:  08/05/21 118/62  08/05/21 120/60  07/11/21 130/84   Pulse Readings from Last 3  Encounters:  08/05/21 88  08/05/21 87  07/11/21 66    Wt Readings from Last 3 Encounters:  08/05/21 210 lb (95.3 kg)  08/05/21 207 lb 12.8 oz (94.3 kg)  07/11/21 206 lb 6.4 oz (93.6 kg)     Kidney Function Lab Results  Component Value Date/Time   CREATININE 1.27 05/13/2021 11:01 AM   CREATININE 1.21 02/06/2021 09:26 AM   CREATININE 1.33 (H) 02/23/2020 09:33 AM   CREATININE 1.46 (H) 10/01/2017 05:03 PM   GFR 54.21 (L) 05/13/2021 11:01 AM   GFRNONAA 52 (L) 08/11/2020 12:44 AM   GFRNONAA 51 (L) 02/23/2020 09:33 AM   GFRAA 59 (L) 02/23/2020 09:33 AM       Latest Ref Rng & Units 05/13/2021   11:01 AM 02/06/2021    9:26 AM 09/25/2020   10:38 AM  BMP  Glucose 70 - 99 mg/dL 105   76   94    BUN 6 - 23 mg/dL '18   17   17    '$ Creatinine 0.40 - 1.50 mg/dL 1.27   1.21   1.16    Sodium 135 - 145 mEq/L 136   136   134    Potassium 3.5 - 5.1 mEq/L 4.1   3.9   4.4    Chloride 96 - 112 mEq/L 99   101   97    CO2 19 - 32 mEq/L 32   31   31    Calcium 8.4 - 10.5 mg/dL 10.2   9.8   10.2      Current antihypertensive regimen:  HCTZ 25 mg daily Valsartan 320 mg daily Amlodipine 5 mg daily  How often are you checking your Blood Pressure? infrequently  Current home BP readings: 118/60  What recent interventions/DTPs have been made by any provider to improve Blood Pressure control since last CPP Visit: No recent interventions or DTPs.  Any recent hospitalizations or ED visits since last visit with CPP? No  What diet changes have been made to improve Blood Pressure Control?  Patient states his diet isn't very good.  What exercise is being done to improve your Blood Pressure Control?  Patient states he is unable to exercise due to knee pain.  Adherence Review: Is the patient currently on ACE/ARB medication? Yes Does the patient have >5 day gap between last estimated fill dates? No  Care Gaps: Medicare Annual Wellness: Completed 05/22/2021 Hemoglobin A1C: 5.7% on  05/13/2021 Colonoscopy: Completed 05/07/2021  Future Appointments  Date Time Provider [place]  09/05/2021 10:15 AM Lyndal Pulley, DO [facility] None  10/01/2021  9:00 AM [name], MD [facility]  04/27/2022 10:00 AM Cameron Sprang, MD [facility] None  06/04/2022  8:45 AM [facility] HEALTH COACH [facility]   Star Rating Drugs: Valsartan 320 mg last filled 07/11/2021 90 DS  April D Calhoun, CMA Clinical  Pharmacist Assistant [phone]

## 2021-08-26 ENCOUNTER — Encounter: Payer: Self-pay | Admitting: Family Medicine

## 2021-09-01 ENCOUNTER — Encounter: Payer: Self-pay | Admitting: Neurology

## 2021-09-02 MED ORDER — GABAPENTIN 300 MG PO CAPS: ORAL_CAPSULE | ORAL | 3 refills | Status: DC

## 2021-09-03 DIAGNOSIS — J3081 Allergic rhinitis due to animal (cat) (dog) hair and dander: Secondary | ICD-10-CM | POA: Diagnosis not present

## 2021-09-03 DIAGNOSIS — M2011 Hallux valgus (acquired), right foot: Secondary | ICD-10-CM | POA: Diagnosis not present

## 2021-09-03 DIAGNOSIS — J301 Allergic rhinitis due to pollen: Secondary | ICD-10-CM | POA: Diagnosis not present

## 2021-09-03 DIAGNOSIS — M2042 Other hammer toe(s) (acquired), left foot: Secondary | ICD-10-CM | POA: Diagnosis not present

## 2021-09-03 DIAGNOSIS — M2041 Other hammer toe(s) (acquired), right foot: Secondary | ICD-10-CM | POA: Diagnosis not present

## 2021-09-03 DIAGNOSIS — J3089 Other allergic rhinitis: Secondary | ICD-10-CM | POA: Diagnosis not present

## 2021-09-03 DIAGNOSIS — M2012 Hallux valgus (acquired), left foot: Secondary | ICD-10-CM | POA: Diagnosis not present

## 2021-09-03 DIAGNOSIS — L600 Ingrowing nail: Secondary | ICD-10-CM | POA: Diagnosis not present

## 2021-09-03 DIAGNOSIS — L03032 Cellulitis of left toe: Secondary | ICD-10-CM | POA: Diagnosis not present

## 2021-09-04 NOTE — Progress Notes (Signed)
Joshua [name] Phone: [phone] Subjective:    I'm seeing this patient by the request  of:  [name], MD  CC: Left hip pain  WER:[id number]  08/05/2021 Discussed HEP discussed also the possibility of PRP.  Patient wants to avoid any more steroid injections if possible.  Follow-up again if he does want to do PRP.  Total time reviewing chart and discussing with patient going over allergies 32   Patient does have some mild loss of lordosis.  Patient continues to have discomfort.  Not responding to the epidurals anymore at this point.  We will consider the possibility of PRP more into the hip area.  Patient had more improvement from an outside facility with an injection there.  Follow-up with me again after discussing with his wife about PRP.  Update 09/05/2021 Joshua [name] is a 81 y.o. male coming in with complaint of L hip pain. Patient states having PRP today in both hips.  Patient does have pain in the hips bilaterally.       Past Medical History:  Diagnosis Date   Acute medial meniscal tear    Anemia 2015   Arthritis    "middle finger right hand; right knee; neck" (02/06/2014)   Asthma    seasonal   Bladder cancer ([place]) 04/2010   "cauterized during prostate OR"   CAD (coronary artery disease)    CABG 2001   Diverticulosis    DJD (degenerative joint disease)    BACK   GERD (gastroesophageal reflux disease)    H/O inguinal hernia repair 12/2018   History of gout    Hyperlipidemia    Hypertension    IBS (irritable bowel syndrome)    Obesity    OSA (obstructive sleep apnea)    USES CPAP    Pancreatitis ~ 1980   Peripheral vascular disease ([place])    Pre-diabetes    Prostate cancer ([place]) 04/2010   Past Surgical History:  Procedure Laterality Date   ANTERIOR CERVICAL DECOMP/DISCECTOMY FUSION  05/26/2006   PLATE PLACED   BACK SURGERY     CARDIAC CATHETERIZATION  11/20/1999   CORONARY  ARTERY BYPASS GRAFT  11/20/1999   SVG-RI1-RI2, SVG-OM, SVG-dRCA   CORONARY STENT INTERVENTION N/A 04/13/2019   Procedure: CORONARY STENT INTERVENTION;  Surgeon: Burnell Blanks, MD;  Location: [facility];  Service: Cardiovascular;  Laterality: N/A;   HERNIA REPAIR     INGUINAL HERNIA REPAIR Left 01/02/2019   Procedure: OPEN LEFT INGUINAL HERNIA REPAIR WITH MESH;  Surgeon: Johnathan Hausen, MD;  Location: [facility];  Service: General;  Laterality: Left;   KNEE ARTHROSCOPY Right 1992; 04/2008   LAPAROSCOPIC CHOLECYSTECTOMY  2007   LEFT HEART CATH AND CORS/GRAFTS ANGIOGRAPHY N/A 04/13/2019   Procedure: LEFT HEART CATH AND CORS/GRAFTS ANGIOGRAPHY;  Surgeon: Burnell Blanks, MD;  Location: [facility];  Service: Cardiovascular;  Laterality: N/A;   NASAL SEPTUM SURGERY Right 2007   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   ROBOT ASSISTED LAPAROSCOPIC RADICAL PROSTATECTOMY  04/2010   THYROIDECTOMY, PARTIAL  1980   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   Procedure: LEFT TOTAL KNEE ARTHROPLASTY;  Surgeon: Melrose Nakayama, MD;  Location: [facility];  Service: Orthopedics;  Laterality: Left;   Social History   Socioeconomic History   Marital status: Married    Spouse name: Not on file   Number of children: Not on file   Years of education: Not on file  Highest education level: Not on file  Occupational History   Occupation: retired    Fish farm manager: RETIRED  Tobacco Use   Smoking status: Former    Packs/day: 1.00    Years: 35.00    Total pack years: 35.00    Types: Cigarettes    Quit date: 06/16/1992    Years since quitting: 29.2   Smokeless tobacco: Never  Vaping Use   Vaping Use: Never used  Substance and Sexual Activity   Alcohol use: Yes    Alcohol/week: 2.0 standard drinks of alcohol    Types: 2 Shots of liquor per week    Comment: occ   Drug use: No   Sexual activity: Not Currently    Partners: Female  Other Topics Concern   Not on file  Social History  Narrative   Married 42 years in 2015. No kids (mumps at age 79)      Retired from [employer] in Engineer, technical sales.  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      Hobbies: poker, movies and tv, staying active      Right handed   2 level home with steps he uses   Social Determinants of Radio broadcast assistant Strain: Low Risk  (05/22/2021)   Overall Financial Resource Strain (CARDIA)    Difficulty of Paying Living Expenses: Not hard at all  Food Insecurity: No Food Insecurity (05/22/2021)   Hunger Vital Sign    Worried About Running Out of Food in the Last Year: Never true    [place] in the Last Year: Never true  Transportation Needs: No Transportation Needs (05/22/2021)   PRAPARE - Hydrologist (Medical): No    Lack of Transportation (Non-Medical): No  Physical Activity: Inactive (05/22/2021)   Exercise Vital Sign    Days of Exercise per Week: 0 days    Minutes of Exercise per Session: 0 min  Stress: No Stress Concern Present (05/22/2021)   [place]    Feeling of Stress : Only a little  Social Connections: Moderately Integrated (05/22/2021)   Social Connection and Isolation Panel [NHANES]    Frequency of Communication with Friends and Family: More than three times a week    Frequency of Social Gatherings with Friends and Family: More than three times a week    Attends Religious Services: Never    Marine scientist or Organizations: Yes    Attends Music therapist: 1 to 4 times per year    Marital Status: Married   Allergies  Allergen Reactions   Benadryl [Diphenhydramine] Other (See Comments)    Pt told not to take because of bypass surgery   Bromfed Nausea And Vomiting   Clarithromycin Other (See Comments)     gastritis   Codeine Other (See Comments)    Trouble breathing   Doxycycline Hyclate Nausea And Vomiting   Lexapro [Escitalopram]     Dizziness   Modafinil  Other (See Comments)     anxiety-nervousness   Oxycodone-Acetaminophen Itching   Promethazine Hcl Other (See Comments)     fainting   Quinolones Nausea Only    Cipro "felt real bad"   Telithromycin Nausea And Vomiting   Tramadol Nausea Only   Hydrocodone-Acetaminophen Rash   Statins Other (See Comments)    Leg cramps and makes patient feel bad. Per pt tried multiple in years past   Sulfonamide Derivatives Rash   Family History  Problem Relation Age of Onset   Heart disease Mother    Hyperlipidemia Mother    Heart disease Father    Hyperlipidemia Father    Diabetes Brother    Colon cancer Neg Hx    Pancreatic cancer Neg Hx    Esophageal cancer Neg Hx    Stomach cancer Neg Hx    Liver cancer Neg Hx      Current Outpatient Medications (Cardiovascular):    Alirocumab (PRALUENT) 150 MG/ML SOAJ, INJECT 1 DOSE INTO THE SKIN EVERY 14 (FOURTEEN) DAYS.   amLODipine (NORVASC) 5 MG tablet, TAKE 1 TABLET (5 MG TOTAL) BY MOUTH DAILY.   EPINEPHrine 0.3 mg/0.3 mL IJ SOAJ injection, Inject 0.3 mg into the muscle as needed for anaphylaxis. for allergic reaction   ezetimibe (ZETIA) 10 MG tablet, TAKE 1 TABLET BY MOUTH EVERY DAY   hydrochlorothiazide (HYDRODIURIL) 25 MG tablet, TAKE 1 TABLET BY MOUTH EVERY DAY   nitroGLYCERIN (NITROSTAT) 0.4 MG SL tablet, PLACE 1 TABLET UNDER THE TONGUE EVERY 5 (FIVE) MINUTES AS NEEDED.   valsartan (DIOVAN) 320 MG tablet, TAKE 1 TABLET BY MOUTH EVERY DAY  Current Outpatient Medications (Respiratory):    albuterol (PROAIR HFA) 108 (90 BASE) MCG/ACT inhaler, Inhale 1-2 puffs into the lungs every 6 (six) hours as needed for wheezing or shortness of breath.   FLOVENT HFA 110 MCG/ACT inhaler, Inhale 1 puff into the lungs 2 (two) times daily.    fluticasone (FLONASE) 50 MCG/ACT nasal spray, Place 1 spray into both nostrils 2 (two) times daily.    montelukast (SINGULAIR) 10 MG tablet, TAKE 1 TABLET BY MOUTH EVERYDAY AT BEDTIME  Current Outpatient Medications  (Analgesics):    acetaminophen (TYLENOL) 500 MG tablet, Take 1,000 mg by mouth every 6 (six) hours as needed for moderate pain or headache.   aspirin EC 81 MG tablet, Take 1 tablet (81 mg total) by mouth 2 (two) times daily after a meal.  Current Outpatient Medications (Hematological):    Cyanocobalamin (B-12) 2500 MCG TABS, Take 1,250 mcg by mouth daily.   folic acid (FOLVITE) 347 MCG tablet, Take 800 mcg by mouth daily.   Current Outpatient Medications (Other):    busPIRone (BUSPAR) 5 MG tablet, Take 1 tablet (5 mg total) by mouth 2 (two) times daily as needed (anxiety).   cholecalciferol (VITAMIN D3) 25 MCG (1000 UNIT) tablet, Take 1,000 Units by mouth daily.   clotrimazole-betamethasone (LOTRISONE) cream, Apply 1 application topically See admin instructions. Twice daily every 4 days   diazepam (VALIUM) 5 MG tablet, Take 1 tablet (5 mg total) by mouth at bedtime as needed (sleep).   gabapentin (NEURONTIN) 300 MG capsule, Take 1 cap in AM, 1 cap at noon, 3 caps at bedtime   GEMTESA 75 MG TABS, TAKE 1 TABLET BY MOUTH DAILY.   Glucos-Chond-Hyal Ac-Ca Fructo (MOVE FREE JOINT HEALTH ADVANCE PO), Take 1 tablet by mouth daily.   hydrocortisone 2.5 % cream, Apply topically.   ketoconazole (NIZORAL) 2 % cream, Apply 1 application topically See admin instructions. Twice a day every other day   Miconazole Nitrate (LOTRIMIN AF DEODORANT POWDER) 2 % AERP, Apply 1 spray topically daily as needed (jock itch).   nystatin-triamcinolone ointment (MYCOLOG), Apply 1 application topically See admin instructions. Twice daily every 4 days   pantoprazole (PROTONIX) 40 MG tablet, TAKE 1 TABLET BY MOUTH EVERY DAY   polyethylene glycol powder (GLYCOLAX/MIRALAX) powder, Take 17 g by mouth daily.   Wheat Dextrin (BENEFIBER) POWD, Take 1 Dose by mouth daily. 1  dose = 2 teaspoons   Objective  Height '5\' 6"'$  (1.676 m).   General: No apparent distress alert and oriented x3 mood and affect normal, dressed appropriately.   Antalgic gait favoring the left knee   Procedure: Real-time Ultrasound Guided Injection of left  greater trochanteric bursitis secondary to patient's body habitus Device: GE Logiq Q7  Ultrasound guided injection is preferred based studies that show increased duration, increased effect, greater accuracy, decreased procedural pain, increased response rate, and decreased cost with ultrasound guided versus blind injection.  Verbal informed consent obtained.  Time-out conducted.  Noted no overlying erythema, induration, or other signs of local infection.  Skin prepped in a sterile fashion.  Local anesthesia: Topical Ethyl chloride.  With sterile technique and under real time ultrasound guidance:  Greater trochanteric area was visualized and patient's bursa was noted. A 22-gauge 3 inch needle was inserted and 4 cc of 0.5% Marcaine and then injected 3.5 cc of PRP Advised to call if fevers/chills, erythema, induration, drainage, or persistent bleeding.  Impression: Technically successful ultrasound guided injection.   Procedure: Real-time Ultrasound Guided Injection of right greater trochanteric bursitis secondary to patient's body habitus Device: GE Logiq Q7 Ultrasound guided injection is preferred based studies that show increased duration, increased effect, greater accuracy, decreased procedural pain, increased response rate, and decreased cost with ultrasound guided versus blind injection.  Verbal informed consent obtained.  Time-out conducted.  Noted no overlying erythema, induration, or other signs of local infection.  Skin prepped in a sterile fashion.  Local anesthesia: Topical Ethyl chloride.  With sterile technique and under real time ultrasound guidance:  Greater trochanteric area was visualized and patient's bursa was noted. A 22-gauge 3 inch needle was inserted and 4 cc of 0.5% Marcaine and injected 2.5 cc of PRP Impression: Technically successful ultrasound guided injection.     Impression and Recommendations:

## 2021-09-05 ENCOUNTER — Ambulatory Visit (INDEPENDENT_AMBULATORY_CARE_PROVIDER_SITE_OTHER): Payer: Self-pay | Admitting: Family Medicine

## 2021-09-05 ENCOUNTER — Ambulatory Visit: Payer: Self-pay

## 2021-09-05 VITALS — Ht 66.0 in

## 2021-09-05 DIAGNOSIS — M5442 Lumbago with sciatica, left side: Secondary | ICD-10-CM

## 2021-09-05 DIAGNOSIS — M7061 Trochanteric bursitis, right hip: Secondary | ICD-10-CM

## 2021-09-05 DIAGNOSIS — G8929 Other chronic pain: Secondary | ICD-10-CM

## 2021-09-05 DIAGNOSIS — M7062 Trochanteric bursitis, left hip: Secondary | ICD-10-CM

## 2021-09-05 NOTE — Patient Instructions (Addendum)
Tylenol instead of Ibuprofen No ice for 3 days See you again in 2 months

## 2021-09-05 NOTE — Assessment & Plan Note (Signed)
Injection given and tolerated the procedure well, discussed icing regimen and home exercises, which activities to do and which ones to avoid, increase activity very slowly after the post PRP.

## 2021-09-10 DIAGNOSIS — G4733 Obstructive sleep apnea (adult) (pediatric): Secondary | ICD-10-CM | POA: Diagnosis not present

## 2021-09-17 ENCOUNTER — Telehealth: Payer: Self-pay | Admitting: Family Medicine

## 2021-09-17 DIAGNOSIS — J301 Allergic rhinitis due to pollen: Secondary | ICD-10-CM | POA: Diagnosis not present

## 2021-09-17 DIAGNOSIS — J3089 Other allergic rhinitis: Secondary | ICD-10-CM | POA: Diagnosis not present

## 2021-09-17 DIAGNOSIS — J3081 Allergic rhinitis due to animal (cat) (dog) hair and dander: Secondary | ICD-10-CM | POA: Diagnosis not present

## 2021-09-17 NOTE — Telephone Encounter (Signed)
Pt called, experiencing L knee/L lower leg swelling. Concerned. Had PRP 6/16, unsure if related. Please call on cell [phone].

## 2021-09-18 ENCOUNTER — Other Ambulatory Visit: Payer: Self-pay | Admitting: Family Medicine

## 2021-09-18 ENCOUNTER — Encounter: Payer: Self-pay | Admitting: Family

## 2021-09-18 ENCOUNTER — Ambulatory Visit (INDEPENDENT_AMBULATORY_CARE_PROVIDER_SITE_OTHER): Payer: PPO | Admitting: Family

## 2021-09-18 VITALS — BP 148/76 | HR 72 | Temp 97.8°F | Ht 66.0 in | Wt 214.0 lb

## 2021-09-18 DIAGNOSIS — R600 Localized edema: Secondary | ICD-10-CM | POA: Diagnosis not present

## 2021-09-18 DIAGNOSIS — R0981 Nasal congestion: Secondary | ICD-10-CM | POA: Diagnosis not present

## 2021-09-18 NOTE — Telephone Encounter (Signed)
Talked to patient. Was told to follow up with PCP or seek medical attention if the swelling continues to move in a negative direction.

## 2021-09-18 NOTE — Patient Instructions (Addendum)
It was very nice to see you today!   As discussed, for your head congestion/flushing - check your blood pressure daily, especially after lunch when you say the symptoms are worse. Let Dr. Yong Channel know if you are seeing readings consistently higher than 145/90 (either number).  Follow up with Dr. Tamala Julian regarding your left knee swelling.   Need to drink 2 liters of water daily, especially since you are taking a diuretic to keep you from retaining fluid, if you get even mildly dehydrated, this can cause swelling. Eat a low sodium diet, limit fast food and restaurant food which is loaded with sodium.  If the swelling persists down the leg I would follow up with your cardiologist and see if a different diuretic is needed.   PLEASE NOTE:  If you had any lab tests please let us know if you have not heard back within a few days. You may see your results on MyChart before we have a chance to review them but we will give you a call once they are reviewed by [place]. If we ordered any referrals today, please let us know if you have not heard from their office within the next week.

## 2021-09-18 NOTE — Progress Notes (Signed)
Subjective:     Patient ID: Joshua [name], male    DOB: 26-Mar-1940, 81 y.o.   MRN: [id number]  Chief Complaint  Patient presents with   Leg Swelling    Pt c/o left leg swelling and knee pain and stiffness for years. Pt states this is a recurrent problem. Pt states he wakes up the swelling isnt bad but after about 20 minutes the leg swelling starts  once he gets up and moving. Pt  does elevate legs at night.    Headache    Pt c/o pressure in head and fatigue since last Saturday. Has tried tylenol which does not help. Pt states it is a flushing feeling.     HPI: Edema: Patient complains of left leg edema from his knee down to the foot, has had off and on since having a TKR last year, but it has been more persistent over the last week or 2 and he has had continued knee pain ever since his surgery, and followed up with the surgeon who told him there was nothing he could do for him. Denies redness or warmth. Taking HCTZ '25mg'$  qd at lunch. Only med changes recently are adding extra '300mg'$  to his Gabapentin (neurology) d/t increased neuropathy pain. Head congestion:  pt describes as pressure in his head, fullness, vs actual headache, also describes a flushing sensation from the neck up to his head and occurs mostly after lunch time. He takes a '300mg'$  Gabapentin and his Gemtesa at lunch. Denies dizziness or nausea.  Assessment & Plan:   Problem List Items Addressed This Visit   None Visit Diagnoses     Leg edema, left    -  Primary Advised pt since surgeon can no longer help him with his knee, he should have Charlann Boxer (who he has seen for his hip last week and who gave him a platelet injection in left hip),  assess the knee. Reports taking his HCTZ every day at lunch so he is not urinating all morning running errands. Admits he does not drink enough water, advised to drink 2L qd, elevate leg as much as possible when resting, and eat a low sodium diet.    Head congestion    - BP slightly  elevated today. Pt does not check BP at home. Advised to do random checks at home, but especially after lunch when he feels this symptom along with the flushing the worst. Sx could be related to elevated BP. Reports taking his Valsartan qhs. Advised to notify his PCP if having elevated BP readings at home.       Outpatient Medications Prior to Visit  Medication Sig Dispense Refill   acetaminophen (TYLENOL) 500 MG tablet Take 1,000 mg by mouth every 6 (six) hours as needed for moderate pain or headache.     albuterol (PROAIR HFA) 108 (90 BASE) MCG/ACT inhaler Inhale 1-2 puffs into the lungs every 6 (six) hours as needed for wheezing or shortness of breath. 18 g 3   Alirocumab (PRALUENT) 150 MG/ML SOAJ INJECT 1 DOSE INTO THE SKIN EVERY 14 (FOURTEEN) DAYS. 2 mL 11   amLODipine (NORVASC) 5 MG tablet TAKE 1 TABLET (5 MG TOTAL) BY MOUTH DAILY. 90 tablet 3   aspirin EC 81 MG tablet Take 1 tablet (81 mg total) by mouth 2 (two) times daily after a meal. 30 tablet 11   busPIRone (BUSPAR) 5 MG tablet Take 1 tablet (5 mg total) by mouth 2 (two) times daily as needed (anxiety). [place]  tablet 2   cholecalciferol (VITAMIN D3) 25 MCG (1000 UNIT) tablet Take 1,000 Units by mouth daily.     clotrimazole-betamethasone (LOTRISONE) cream Apply 1 application topically See admin instructions. Twice daily every 4 days     Cyanocobalamin (B-12) 2500 MCG TABS Take 1,250 mcg by mouth daily.     diazepam (VALIUM) 5 MG tablet Take 1 tablet (5 mg total) by mouth at bedtime as needed (sleep). 30 tablet 1   EPINEPHrine 0.3 mg/0.3 mL IJ SOAJ injection Inject 0.3 mg into the muscle as needed for anaphylaxis. for allergic reaction     ezetimibe (ZETIA) 10 MG tablet TAKE 1 TABLET BY MOUTH EVERY DAY 90 tablet 3   FLOVENT HFA 110 MCG/ACT inhaler Inhale 1 puff into the lungs 2 (two) times daily.   5   fluticasone (FLONASE) 50 MCG/ACT nasal spray Place 1 spray into both nostrils 2 (two) times daily.      folic acid (FOLVITE) 163 MCG tablet  Take 800 mcg by mouth daily.      gabapentin (NEURONTIN) 300 MG capsule Take 1 cap in AM, 1 cap at noon, 3 caps at bedtime 450 capsule 3   GEMTESA 75 MG TABS TAKE 1 TABLET BY MOUTH DAILY. 30 tablet 11   Glucos-Chond-Hyal Ac-Ca Fructo (MOVE FREE JOINT HEALTH ADVANCE PO) Take 1 tablet by mouth daily.     hydrochlorothiazide (HYDRODIURIL) 25 MG tablet TAKE 1 TABLET BY MOUTH EVERY DAY 90 tablet 2   ketoconazole (NIZORAL) 2 % cream Apply 1 application topically See admin instructions. Twice a day every other day     Miconazole Nitrate (LOTRIMIN AF DEODORANT POWDER) 2 % AERP Apply 1 spray topically daily as needed (jock itch).     nitroGLYCERIN (NITROSTAT) 0.4 MG SL tablet PLACE 1 TABLET UNDER THE TONGUE EVERY 5 (FIVE) MINUTES AS NEEDED. 75 tablet 1   pantoprazole (PROTONIX) 40 MG tablet TAKE 1 TABLET BY MOUTH EVERY DAY 90 tablet 3   polyethylene glycol powder (GLYCOLAX/MIRALAX) powder Take 17 g by mouth daily. 255 g 0   valsartan (DIOVAN) 320 MG tablet TAKE 1 TABLET BY MOUTH EVERY DAY 90 tablet 3   Wheat Dextrin (BENEFIBER) POWD Take 1 Dose by mouth daily. 1 dose = 2 teaspoons     hydrocortisone 2.5 % cream Apply topically.     nystatin-triamcinolone ointment (MYCOLOG) Apply 1 application topically See admin instructions. Twice daily every 4 days     montelukast (SINGULAIR) 10 MG tablet TAKE 1 TABLET BY MOUTH EVERYDAY AT BEDTIME 90 tablet 1   No facility-administered medications prior to visit.    Past Medical History:  Diagnosis Date   Acute medial meniscal tear    Anemia 2015   Arthritis    "middle finger right hand; right knee; neck" (02/06/2014)   Asthma    seasonal   Bladder cancer ([place]) 04/2010   "cauterized during prostate OR"   CAD (coronary artery disease)    CABG 2001   Diverticulosis    DJD (degenerative joint disease)    BACK   GERD (gastroesophageal reflux disease)    H/O inguinal hernia repair 12/2018   History of gout    Hyperlipidemia    Hypertension    IBS (irritable  bowel syndrome)    Obesity    OSA (obstructive sleep apnea)    USES CPAP    Pancreatitis ~ 1980   Peripheral vascular disease ([place])    Pre-diabetes    Prostate cancer ([place]) 04/2010    Past Surgical History:  Procedure Laterality Date   ANTERIOR CERVICAL DECOMP/DISCECTOMY FUSION  05/26/2006   PLATE PLACED   BACK SURGERY     CARDIAC CATHETERIZATION  11/20/1999   CORONARY ARTERY BYPASS GRAFT  11/20/1999   SVG-RI1-RI2, SVG-OM, SVG-dRCA   CORONARY STENT INTERVENTION N/A 04/13/2019   Procedure: CORONARY STENT INTERVENTION;  Surgeon: Burnell Blanks, MD;  Location: [facility];  Service: Cardiovascular;  Laterality: N/A;   HERNIA REPAIR     INGUINAL HERNIA REPAIR Left 01/02/2019   Procedure: OPEN LEFT INGUINAL HERNIA REPAIR WITH MESH;  Surgeon: Johnathan Hausen, MD;  Location: [facility];  Service: General;  Laterality: Left;   KNEE ARTHROSCOPY Right 1992; 04/2008   LAPAROSCOPIC CHOLECYSTECTOMY  2007   LEFT HEART CATH AND CORS/GRAFTS ANGIOGRAPHY N/A 04/13/2019   Procedure: LEFT HEART CATH AND CORS/GRAFTS ANGIOGRAPHY;  Surgeon: Burnell Blanks, MD;  Location: [facility];  Service: Cardiovascular;  Laterality: N/A;   NASAL SEPTUM SURGERY Right 2007   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   ROBOT ASSISTED LAPAROSCOPIC RADICAL PROSTATECTOMY  04/2010   THYROIDECTOMY, PARTIAL  1980   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   Procedure: LEFT TOTAL KNEE ARTHROPLASTY;  Surgeon: Melrose Nakayama, MD;  Location: [facility];  Service: Orthopedics;  Laterality: Left;    Allergies  Allergen Reactions   Benadryl [Diphenhydramine] Other (See Comments)    Pt told not to take because of bypass surgery   Bromfed Nausea And Vomiting   Clarithromycin Other (See Comments)     gastritis   Codeine Other (See Comments)    Trouble breathing   Doxycycline Hyclate Nausea And Vomiting   Lexapro [Escitalopram]     Dizziness   Modafinil Other (See Comments)     anxiety-nervousness    Oxycodone-Acetaminophen Itching   Promethazine Hcl Other (See Comments)     fainting   Quinolones Nausea Only    Cipro "felt real bad"   Telithromycin Nausea And Vomiting   Tramadol Nausea Only   Hydrocodone-Acetaminophen Rash   Statins Other (See Comments)    Leg cramps and makes patient feel bad. Per pt tried multiple in years past   Sulfonamide Derivatives Rash       Objective:    Physical Exam Vitals and nursing note reviewed.  Constitutional:      General: He is not in acute distress.    Appearance: Normal appearance.  HENT:     Head: Normocephalic.  Cardiovascular:     Rate and Rhythm: Normal rate and regular rhythm.     Comments: 3+ swelling in left anterior knee, leg and ankle with notable swelling, but not as much as the knee. No erythema or warmth noted. Pulmonary:     Effort: Pulmonary effort is normal.     Breath sounds: Normal breath sounds.  Musculoskeletal:        General: Normal range of motion.     Cervical back: Normal range of motion.     Left lower leg: Edema present.  Skin:    General: Skin is warm and dry.  Neurological:     Mental Status: He is alert and oriented to person, place, and time.  Psychiatric:        Mood and Affect: Mood normal.     BP (!) 148/76 (BP Location: Left Arm, Patient Position: Sitting, Cuff Size: Large)   Pulse 72   Temp 97.8 F (36.6 C) (Temporal)   Ht '5\' 6"'$  (1.676 m)   Wt 214 lb (97.1 kg)   SpO2 95%  BMI 34.54 kg/m  Wt Readings from Last 3 Encounters:  09/18/21 214 lb (97.1 kg)  08/05/21 210 lb (95.3 kg)  08/05/21 207 lb 12.8 oz (94.3 kg)   *Extra time (28mn) spent with patient today which consisted of chart review, discussing diagnoses, work up, treatment, answering a lot of questions, and documentation.      HJeanie Sewer NP

## 2021-09-19 DIAGNOSIS — M25462 Effusion, left knee: Secondary | ICD-10-CM | POA: Diagnosis not present

## 2021-09-19 DIAGNOSIS — M25562 Pain in left knee: Secondary | ICD-10-CM | POA: Diagnosis not present

## 2021-09-19 DIAGNOSIS — Z96652 Presence of left artificial knee joint: Secondary | ICD-10-CM | POA: Diagnosis not present

## 2021-09-25 DIAGNOSIS — M25562 Pain in left knee: Secondary | ICD-10-CM | POA: Diagnosis not present

## 2021-09-25 DIAGNOSIS — Z96652 Presence of left artificial knee joint: Secondary | ICD-10-CM | POA: Diagnosis not present

## 2021-09-25 DIAGNOSIS — M25462 Effusion, left knee: Secondary | ICD-10-CM | POA: Diagnosis not present

## 2021-09-28 ENCOUNTER — Encounter: Payer: Self-pay | Admitting: Cardiology

## 2021-09-29 ENCOUNTER — Ambulatory Visit: Payer: PPO | Admitting: Sports Medicine

## 2021-10-01 ENCOUNTER — Encounter: Payer: Self-pay | Admitting: Family Medicine

## 2021-10-01 ENCOUNTER — Ambulatory Visit (INDEPENDENT_AMBULATORY_CARE_PROVIDER_SITE_OTHER): Payer: PPO | Admitting: Family Medicine

## 2021-10-01 VITALS — BP 120/60 | HR 80 | Temp 97.8°F | Ht 66.0 in | Wt 212.4 lb

## 2021-10-01 DIAGNOSIS — J3081 Allergic rhinitis due to animal (cat) (dog) hair and dander: Secondary | ICD-10-CM | POA: Diagnosis not present

## 2021-10-01 DIAGNOSIS — I10 Essential (primary) hypertension: Secondary | ICD-10-CM

## 2021-10-01 DIAGNOSIS — E785 Hyperlipidemia, unspecified: Secondary | ICD-10-CM | POA: Diagnosis not present

## 2021-10-01 DIAGNOSIS — N183 Chronic kidney disease, stage 3 unspecified: Secondary | ICD-10-CM

## 2021-10-01 DIAGNOSIS — I2510 Atherosclerotic heart disease of native coronary artery without angina pectoris: Secondary | ICD-10-CM

## 2021-10-01 DIAGNOSIS — J301 Allergic rhinitis due to pollen: Secondary | ICD-10-CM | POA: Diagnosis not present

## 2021-10-01 DIAGNOSIS — J3089 Other allergic rhinitis: Secondary | ICD-10-CM | POA: Diagnosis not present

## 2021-10-01 NOTE — Progress Notes (Addendum)
Phone [phone] In person visit   Subjective:   Joshua [name] is a 81 y.o. year old very pleasant male patient who presents for/with See problem oriented charting Chief Complaint  Patient presents with   Follow-up   Hypertension   brusing    Pt concerned about bruising.   Depression    Pt states depression is getting worse.    Past Medical History-  Patient Active Problem List   Diagnosis Date Noted   Coronary atherosclerosis 10/12/2006    Priority: High   Irritable bowel syndrome 10/12/2006    Priority: High   Overactive bladder 11/24/2019    Priority: Medium    Hyperglycemia 10/07/2017    Priority: Medium    Gout 11/20/2016    Priority: Medium    CKD (chronic kidney disease), stage III ([place]) 10/23/2013    Priority: Medium    Neuropathy ([place]) 08/21/2013    Priority: Medium    History of bladder cancer 04/01/2010    Priority: Medium    Essential hypertension 02/21/2007    Priority: Medium    Hyperlipidemia 10/12/2006    Priority: Medium    Major depression in full remission ([place]) 10/12/2006    Priority: Medium    Asthma 10/12/2006    Priority: Medium    Aortic atherosclerosis ([place]) 05/05/2018    Priority: Low   DJD (degenerative joint disease)     Priority: Low   Arthritis     Priority: Low   Neck pain 02/08/2017    Priority: Low   Venous insufficiency 12/29/2016    Priority: Low   Ganglion cyst of tendon sheath of right hand 12/29/2016    Priority: Low   Avulsion fracture of metatarsal bone of right foot 11/25/2016    Priority: Low   Obesity, Class I, BMI 30-34.9 10/03/2015    Priority: Low   Allergic rhinitis 05/31/2014    Priority: Low   Fatigue 11/09/2013    Priority: Low   Dizziness 08/23/2013    Priority: Low   RLS (restless legs syndrome) 01/03/2013    Priority: Low   Chronic bilateral low back pain with left-sided sciatica 10/09/2011    Priority: Low   OSTEOARTHRITIS, LOWER LEG, LEFT 10/04/2009    Priority: Low   ANEMIA, IRON  DEFICIENCY 11/06/2008    Priority: Low   TINNITUS, CHRONIC, 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    Priority: Low   OSA (obstructive sleep apnea) 11/02/2006    Priority: Low   GERD 10/12/2006    Priority: Low   Greater trochanteric bursitis of right hip 02/17/2021   Hyperactive bowel sounds 01/29/2021   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 ([place]) 09/25/2020   Primary osteoarthritis of left knee 06/18/2020   Pre-operative respiratory examination 06/05/2020   Coronary artery disease involving coronary bypass graft of native heart without angina pectoris 04/07/2020   Trigger thumb, left thumb 01/24/2020   Left lateral epicondylitis 09/05/2019   Degenerative arthritis of left knee 08/08/2019   Educated about COVID-19 virus infection 04/20/2019   Unstable angina (HCC)    Greater trochanteric bursitis of left hip 10/27/2018   Degenerative disc disease, lumbar 10/27/2018   Abdominal muscle strain, initial encounter 06/16/2018   Weakness of left leg 06/21/2017   Hereditary and idiopathic peripheral neuropathy 09/25/2014   Generalized abdominal pain 01/31/2009    Medications- reviewed and updated Current Outpatient Medications  Medication Sig Dispense Refill   acetaminophen (TYLENOL) 500 MG tablet Take 1,000 mg by mouth every 6 (six) hours as needed  for moderate pain or headache.     albuterol (PROAIR HFA) 108 (90 BASE) MCG/ACT inhaler Inhale 1-2 puffs into the lungs every 6 (six) hours as needed for wheezing or shortness of breath. 18 g 3   Alirocumab (PRALUENT) 150 MG/ML SOAJ INJECT 1 DOSE INTO THE SKIN EVERY 14 (FOURTEEN) DAYS. 2 mL 11   amLODipine (NORVASC) 5 MG tablet TAKE 1 TABLET (5 MG TOTAL) BY MOUTH DAILY. 90 tablet 3   aspirin EC 81 MG tablet Take 1 tablet (81 mg total) by mouth 2 (two) times daily after a meal. 30 tablet 11   busPIRone (BUSPAR) 5 MG tablet Take 1 tablet (5 mg total) by mouth 2 (two) times daily as needed (anxiety). 40 tablet 2   cholecalciferol (VITAMIN  D3) 25 MCG (1000 UNIT) tablet Take 1,000 Units by mouth daily.     clotrimazole-betamethasone (LOTRISONE) cream Apply 1 application topically See admin instructions. Twice daily every 4 days     Cyanocobalamin (B-12) 2500 MCG TABS Take 1,250 mcg by mouth daily.     diazepam (VALIUM) 5 MG tablet TAKE 1 TABLET (5 MG TOTAL) BY MOUTH AT BEDTIME AS NEEDED (SLEEP). 30 tablet 1   EPINEPHrine 0.3 mg/0.3 mL IJ SOAJ injection Inject 0.3 mg into the muscle as needed for anaphylaxis. for allergic reaction     ezetimibe (ZETIA) 10 MG tablet TAKE 1 TABLET BY MOUTH EVERY DAY 90 tablet 3   FLOVENT HFA 110 MCG/ACT inhaler Inhale 1 puff into the lungs 2 (two) times daily.   5   fluticasone (FLONASE) 50 MCG/ACT nasal spray Place 1 spray into both nostrils 2 (two) times daily.      folic acid (FOLVITE) 756 MCG tablet Take 800 mcg by mouth daily.      gabapentin (NEURONTIN) 300 MG capsule Take 1 cap in AM, 1 cap at noon, 3 caps at bedtime 450 capsule 3   GEMTESA 75 MG TABS TAKE 1 TABLET BY MOUTH DAILY. 30 tablet 11   Glucos-Chond-Hyal Ac-Ca Fructo (MOVE FREE JOINT HEALTH ADVANCE PO) Take 1 tablet by mouth daily.     hydrochlorothiazide (HYDRODIURIL) 25 MG tablet TAKE 1 TABLET BY MOUTH EVERY DAY 90 tablet 2   ketoconazole (NIZORAL) 2 % cream Apply 1 application topically See admin instructions. Twice a day every other day     Miconazole Nitrate (LOTRIMIN AF DEODORANT POWDER) 2 % AERP Apply 1 spray topically daily as needed (jock itch).     nitroGLYCERIN (NITROSTAT) 0.4 MG SL tablet PLACE 1 TABLET UNDER THE TONGUE EVERY 5 (FIVE) MINUTES AS NEEDED. 75 tablet 1   pantoprazole (PROTONIX) 40 MG tablet TAKE 1 TABLET BY MOUTH EVERY DAY 90 tablet 3   polyethylene glycol powder (GLYCOLAX/MIRALAX) powder Take 17 g by mouth daily. 255 g 0   valsartan (DIOVAN) 320 MG tablet TAKE 1 TABLET BY MOUTH EVERY DAY 90 tablet 3   Wheat Dextrin (BENEFIBER) POWD Take 1 Dose by mouth daily. 1 dose = 2 teaspoons     No current  facility-administered medications for this visit.     Objective:  BP 120/60   Pulse 80   Temp 97.8 F (36.6 C)   Ht '5\' 6"'$  (1.676 m)   Wt 212 lb 6.4 oz (96.3 kg)   SpO2 96%   BMI 34.28 kg/m  Gen: NAD, resting comfortably CV: RRR no murmurs rubs or gallops Lungs: CTAB no crackles, wheeze, rhonchi Abdomen: soft/nontender/nondistended/normal bowel sounds.  Ext: 1+ left >right edema (always swells more on left) Skin:  warm, dry     Assessment and Plan   #CAD/hyperlipidemia-history of CABG x5- follows with Dr. Percival Spanish S: Meds: asa '81mg'$ , praluent -Fatigue on Repatha in 2021-restarted on simvastatin 20 mg with plan for Zetia 10 mg p.o. as well but later placed back on praluent history -Stent drug eluting placed 04/13/19 Dr. Angelena Form -Denies chest pain shortness of breath- went for walk this morning and when got home did have some fatigue.   Lab Results  Component Value Date   CHOL 129 05/13/2021   HDL 51.60 05/13/2021   LDLCALC 48 05/13/2021   LDLDIRECT 115.0 06/04/2016   TRIG 149.0 05/13/2021   CHOLHDL 3 05/13/2021  A/P: CAD asymptomatic- continue current medication  Lipids well controlled in February- continue praluent   #Hypertension/CKD stage III S: Compliant with amlodipine 5 mg, hydrochlorothiazide 25 mg and valsartan '320mg'$  -GFR has been stable.  On ARB in case proteinuric element -blood pressure on steroids had gone up even over 200recently but has trended back down- today 128/70 at home BP Readings from Last 3 Encounters:  10/01/21 120/60  09/18/21 (!) 148/76  08/05/21 118/62  A/P: Hypertension well-controlled today-did go up on steroids so we will need to be cautious with these in the future. - CKD stage III has been stable- ortho has wanted nsaid course and he got this cleared by cardiology- I do want him to watch blood pressure closely with 1 week course - can raise blood pressure and we also discussed kidney - in general would avoid nsaids- will check CMP     #Depression, major, single episode-remains in partial remission S:  Medication: Buspirone 5 mg twice daily as needed-uses very sparingly- mild help.  - has tried several times to find therapist fit- has had trouble with this except one who is no longer working - SE on SSRI  -poker is a good outlet    10/01/2021    8:58 AM 09/18/2021    4:43 PM 05/22/2021   11:11 AM  Depression screen PHQ 2/9  Decreased Interest 1 1 0  Down, Depressed, Hopeless '1 1 1  '$ PHQ - 2 Score '2 2 1  '$ Altered sleeping 0 2 0  Tired, decreased energy 2 3 0  Change in appetite 0 0 0  Feeling bad or failure about yourself  2 2 0  Trouble concentrating 0 0 0  Moving slowly or fidgety/restless 0 0 0  Suicidal thoughts 0 0 0  PHQ-9 Score '6 9 1  '$ Difficult doing work/chores Not difficult at all Somewhat difficult   A/P: partial remission/mild poor control - he wants to remain on current medicine and continue to monitor- will let us know if worsens. No SI.    #senile purpura- noted- complains of easy bruising- aspirin likely contributes. Stable. Check cbc at least annually  #PRP helped hips but back still bothering him  Recommended follow up: Return in about 3 months (around 01/01/2022) for followup or sooner if needed.Schedule b4 you leave. Future Appointments  Date Time Provider [place]  11/05/2021  9:00 AM Lyndal Pulley, DO [facility] None  04/27/2022 10:00 AM Cameron Sprang, MD [facility] None  06/04/2022  8:45 AM [facility] HEALTH COACH [facility]   Lab/Order associations:   ICD-10-CM   1. Atherosclerosis of native coronary artery of native heart without angina pectoris  I25.10     2. Hyperlipidemia, unspecified hyperlipidemia type  E78.5     3. Essential hypertension  I10     4. Stage 3 chronic kidney disease,  unspecified whether stage 3a or 3b CKD ([place])  N18.30      No orders of the defined types were placed in this encounter.  Return precautions advised.  [name], MD

## 2021-10-01 NOTE — Patient Instructions (Addendum)
Senile purpura is name of bruising issues  Hold off on labs today- repeat next visit  Recommended follow up: Return in about 3 months (around 01/01/2022) for followup or sooner if needed.Schedule b4 you leave.

## 2021-10-13 ENCOUNTER — Telehealth: Payer: Self-pay | Admitting: Pharmacist

## 2021-10-13 NOTE — Progress Notes (Signed)
    Chronic Care Management Pharmacy Assistant   Name: Joshua [name]  MRN: [id number]  DOB: May 17, 1940   I spoke to Mr. Swire on 7/21 as he called me to ask about signs, and symptoms of long time use of Gabapentin. Mr. Matusek expresses how he was reading a article that mentioned this particular medication could cause harm when taking in this manner. Mr. Lowe did mention the only symptom that he has experienced was some diarrhea at times, along with left leg swelling that he has already discussed with his PCP. I reached out to my pharmacist to address Mr. Seal concerns,and was advised that this was common. I tried to contact Mr. Espino back to update him, but got his voicemail. I reached out to him this morning with advice from the pharmacist, and he was very thankful, during our conversation this morning, Mr. Tabet made me aware that he has gained some weight, and he knows his diet is poor, I advised him as he states he enjoys food, to watch portion control, and incorporate fruits veggies, and plenty of water instead of soft drinks,he agreed to try.    [place], PTM

## 2021-10-15 DIAGNOSIS — J3081 Allergic rhinitis due to animal (cat) (dog) hair and dander: Secondary | ICD-10-CM | POA: Diagnosis not present

## 2021-10-15 DIAGNOSIS — J301 Allergic rhinitis due to pollen: Secondary | ICD-10-CM | POA: Diagnosis not present

## 2021-10-15 DIAGNOSIS — J3089 Other allergic rhinitis: Secondary | ICD-10-CM | POA: Diagnosis not present

## 2021-10-22 DIAGNOSIS — G4733 Obstructive sleep apnea (adult) (pediatric): Secondary | ICD-10-CM | POA: Diagnosis not present

## 2021-10-23 DIAGNOSIS — B356 Tinea cruris: Secondary | ICD-10-CM | POA: Diagnosis not present

## 2021-10-24 DIAGNOSIS — G8929 Other chronic pain: Secondary | ICD-10-CM | POA: Diagnosis not present

## 2021-10-24 DIAGNOSIS — I739 Peripheral vascular disease, unspecified: Secondary | ICD-10-CM | POA: Diagnosis not present

## 2021-10-24 DIAGNOSIS — D692 Other nonthrombocytopenic purpura: Secondary | ICD-10-CM | POA: Diagnosis not present

## 2021-10-24 DIAGNOSIS — I8393 Asymptomatic varicose veins of bilateral lower extremities: Secondary | ICD-10-CM | POA: Diagnosis not present

## 2021-10-24 DIAGNOSIS — M545 Low back pain, unspecified: Secondary | ICD-10-CM | POA: Diagnosis not present

## 2021-10-29 DIAGNOSIS — M25562 Pain in left knee: Secondary | ICD-10-CM | POA: Diagnosis not present

## 2021-10-29 DIAGNOSIS — Z96652 Presence of left artificial knee joint: Secondary | ICD-10-CM | POA: Diagnosis not present

## 2021-10-29 DIAGNOSIS — J3081 Allergic rhinitis due to animal (cat) (dog) hair and dander: Secondary | ICD-10-CM | POA: Diagnosis not present

## 2021-10-29 DIAGNOSIS — J301 Allergic rhinitis due to pollen: Secondary | ICD-10-CM | POA: Diagnosis not present

## 2021-10-29 DIAGNOSIS — J3089 Other allergic rhinitis: Secondary | ICD-10-CM | POA: Diagnosis not present

## 2021-10-30 ENCOUNTER — Encounter: Payer: Self-pay | Admitting: Family Medicine

## 2021-11-03 ENCOUNTER — Encounter: Payer: Self-pay | Admitting: Family

## 2021-11-03 ENCOUNTER — Telehealth: Payer: Self-pay | Admitting: Family Medicine

## 2021-11-03 ENCOUNTER — Ambulatory Visit (INDEPENDENT_AMBULATORY_CARE_PROVIDER_SITE_OTHER): Payer: PPO | Admitting: Family

## 2021-11-03 VITALS — BP 104/62 | HR 80 | Temp 97.9°F | Ht 66.0 in | Wt 213.0 lb

## 2021-11-03 DIAGNOSIS — J0110 Acute frontal sinusitis, unspecified: Secondary | ICD-10-CM

## 2021-11-03 DIAGNOSIS — R059 Cough, unspecified: Secondary | ICD-10-CM

## 2021-11-03 LAB — POC COVID19 BINAXNOW: SARS Coronavirus 2 Ag: NEGATIVE

## 2021-11-03 LAB — POCT RAPID STREP A (OFFICE): Rapid Strep A Screen: NEGATIVE

## 2021-11-03 NOTE — Telephone Encounter (Signed)
Patient Name: Joshua [name] Gender: Male DOB: Oct 30, 1940 Age: 81 Y 89 M 22 D Return Phone Number: [phone] (Primary), [phone] (Secondary) Address: City/ State/ Zip: [place]  [address] Client [facility] Client Site [facility] Night Provider [name]- MD Contact Type Call Who Is Calling Patient / Member / Family / Caregiver Call Type Triage / Clinical Relationship To Patient Self Return Phone Number [phone] (Primary) Chief Complaint Headache Reason for Call Symptomatic / Request for [place] states wants to be seen today; nasal congestion, cough, sore throat; Headache today; Sat Covid 19 neg; Translation No Nurse Assessment Nurse: Micki Riley, RN, Domenick Gong Date/Time (Eastern Time): 11/03/2021 8:10:25 AM Confirm and document reason for call. If symptomatic, describe symptoms. ---Caller states he tested COVID- on Saturday. States he has been having nasal congestion, cough, sore throat, sinus pain, fatigue, & headache; first noted Saturday. Denies fever or any other symptoms. Does the patient have any new or worsening symptoms? ---Yes Will a triage be completed? ---Yes Related visit to physician within the last 2 weeks? ---No Does the PT have any chronic conditions? (i.e. diabetes, asthma, this includes High risk factors for pregnancy, etc.) ---Yes List chronic conditions. ---asthma allergies, 5xcabg 2001, arthritis, knee replacement Is this a behavioral health or substance abuse call? ---No Guidelines Guideline Title Affirmed Question Affirmed Notes Nurse Date/Time (Eastern Time) COVID-19 - Diagnosed or Suspected [1] HIGH RISK patient (e.g., weak immune system, age > 20 years, obesity with BMI 30 or higher, pregnant, Cazares, RN, Domenick Gong 8/14/2  Guidelines Guideline Title Affirmed Question Affirmed Notes Nurse  Date/Time (Eastern Time) chronic lung disease or other chronic medical condition) AND [2] COVID symptoms (e.g., cough, fever) (Exceptions: Already seen by PCP and no new or worsening symptoms.) Disp. Time Eilene Ghazi Time) Disposition Final User 11/03/2021 8:18:29 AM Call PCP within 24 Hours Yes Micki Riley, RN, Domenick Gong Final Disposition 11/03/2021 8:18:29 AM Call PCP within 24 Hours Yes Cazares, RN, [place] Disagree/Comply Comply Caller Understands Yes PreDisposition Did not know what to do Care Advice Given Per Guideline CALL PCP WITHIN 24 HOURS: ALTERNATE DISPOSITION - TELEMEDICINE WITHIN 24 HOURS: CALL BACK IF: * You become worse CARE ADVICE given per COVID-19 - DIAGNOSED OR SUSPECTED (Adult) guideline. COVID-19 - HOW TO PROTECT OTHERS - WHEN YOU ARE SICK WITH COVID-19: * STAY HOME A MINIMUM OF 5 DAYS: People with MILD COVID-19 can STOP HOME ISOLATION AFTER 5 DAYS if (1) fever has been gone for 24 hours (without using fever medicine) AND (2) symptoms are better. Continue to wear a well-fitted mask for a full 10 days when around others. GENERAL CARE ADVICE FOR COVID-19 SYMPTOMS: * Feeling dehydrated: Drink extra liquids. If the air in your home is dry, use a humidifier. Comments User: Domenick Gong, Micki Riley, RN Date/Time Eilene Ghazi Time): 11/03/2021 8:19:48 AM o2 sat was 95% during call. Referrals REFERRED TO PCP OFFICE

## 2021-11-03 NOTE — Telephone Encounter (Signed)
Pt seeing [facility] today.

## 2021-11-03 NOTE — Progress Notes (Unsigned)
Patient ID: Joshua [name], male    DOB: Aug 22, 1940, 81 y.o.   MRN: [id number]  Chief Complaint  Patient presents with   Sinus Problem    Pt c/o Sinus pressure/pain from throat to chest, Nasal congestion(clear), fatigue,sore throat, dry cough for about 3 days, Pt was Covid negative on 8/12. Has tried tylenol which did help some what.      HPI: Sinusitis:  sx started 3d ago, sinus pressure and headache, clear nasal drainage, very tired, scratchy throat, dry cough, home Covid test negative 2 days ago. Reports using Flonase nasal spray bid with Flovent inhaler bid.   Assessment & Plan:  1. Cough, unspecified type - lungs clear today; rapid strep & covid negative; cough mild, non-productive, most likely d/t postnasal drip.  - POCT rapid strep A - POC COVID-19  2. Acute non-recurrent frontal sinusitis - advised pt to continue current meds, Flonase bid, oral antihistamine, advised on using saline nasal spray tid, take Tylenol prn for headache, increase water intake to 2L qd. Call back end of week if sx are not resolving.   Subjective:    Outpatient Medications Prior to Visit  Medication Sig Dispense Refill   acetaminophen (TYLENOL) 500 MG tablet Take 1,000 mg by mouth every 6 (six) hours as needed for moderate pain or headache.     albuterol (PROAIR HFA) 108 (90 BASE) MCG/ACT inhaler Inhale 1-2 puffs into the lungs every 6 (six) hours as needed for wheezing or shortness of breath. 18 g 3   Alirocumab (PRALUENT) 150 MG/ML SOAJ INJECT 1 DOSE INTO THE SKIN EVERY 14 (FOURTEEN) DAYS. 2 mL 11   amLODipine (NORVASC) 5 MG tablet TAKE 1 TABLET (5 MG TOTAL) BY MOUTH DAILY. 90 tablet 3   aspirin EC 81 MG tablet Take 1 tablet (81 mg total) by mouth 2 (two) times daily after a meal. 30 tablet 11   busPIRone (BUSPAR) 5 MG tablet Take 1 tablet (5 mg total) by mouth 2 (two) times daily as needed (anxiety). 40 tablet 2   cholecalciferol (VITAMIN D3) 25 MCG (1000 UNIT) tablet Take 1,000 Units by mouth  daily.     clotrimazole-betamethasone (LOTRISONE) cream Apply 1 application topically See admin instructions. Twice daily every 4 days     Cyanocobalamin (B-12) 2500 MCG TABS Take 1,250 mcg by mouth daily.     diazepam (VALIUM) 5 MG tablet TAKE 1 TABLET (5 MG TOTAL) BY MOUTH AT BEDTIME AS NEEDED (SLEEP). 30 tablet 1   EPINEPHrine 0.3 mg/0.3 mL IJ SOAJ injection Inject 0.3 mg into the muscle as needed for anaphylaxis. for allergic reaction     ezetimibe (ZETIA) 10 MG tablet TAKE 1 TABLET BY MOUTH EVERY DAY 90 tablet 3   FLOVENT HFA 110 MCG/ACT inhaler Inhale 1 puff into the lungs 2 (two) times daily.   5   fluticasone (FLONASE) 50 MCG/ACT nasal spray Place 1 spray into both nostrils 2 (two) times daily.      folic acid (FOLVITE) 387 MCG tablet Take 800 mcg by mouth daily.      gabapentin (NEURONTIN) 300 MG capsule Take 1 cap in AM, 1 cap at noon, 3 caps at bedtime 450 capsule 3   GEMTESA 75 MG TABS TAKE 1 TABLET BY MOUTH DAILY. 30 tablet 11   Glucos-Chond-Hyal Ac-Ca Fructo (MOVE FREE JOINT HEALTH ADVANCE PO) Take 1 tablet by mouth daily.     hydrochlorothiazide (HYDRODIURIL) 25 MG tablet TAKE 1 TABLET BY MOUTH EVERY DAY 90 tablet 2   ketoconazole (  NIZORAL) 2 % cream Apply 1 application topically See admin instructions. Twice a day every other day     Miconazole Nitrate (LOTRIMIN AF DEODORANT POWDER) 2 % AERP Apply 1 spray topically daily as needed (jock itch).     nitroGLYCERIN (NITROSTAT) 0.4 MG SL tablet PLACE 1 TABLET UNDER THE TONGUE EVERY 5 (FIVE) MINUTES AS NEEDED. 75 tablet 1   pantoprazole (PROTONIX) 40 MG tablet TAKE 1 TABLET BY MOUTH EVERY DAY 90 tablet 3   polyethylene glycol powder (GLYCOLAX/MIRALAX) powder Take 17 g by mouth daily. 255 g 0   valsartan (DIOVAN) 320 MG tablet TAKE 1 TABLET BY MOUTH EVERY DAY 90 tablet 3   Wheat Dextrin (BENEFIBER) POWD Take 1 Dose by mouth daily. 1 dose = 2 teaspoons     No facility-administered medications prior to visit.   Past Medical History:   Diagnosis Date   Acute medial meniscal tear    Anemia 2015   Arthritis    "middle finger right hand; right knee; neck" (02/06/2014)   Asthma    seasonal   Bladder cancer ([place]) 04/2010   "cauterized during prostate OR"   CAD (coronary artery disease)    CABG 2001   Diverticulosis    DJD (degenerative joint disease)    BACK   GERD (gastroesophageal reflux disease)    H/O inguinal hernia repair 12/2018   History of gout    Hyperlipidemia    Hypertension    IBS (irritable bowel syndrome)    Obesity    OSA (obstructive sleep apnea)    USES CPAP    Pancreatitis ~ 1980   Peripheral vascular disease ([place])    Pre-diabetes    Prostate cancer ([place]) 04/2010   Past Surgical History:  Procedure Laterality Date   ANTERIOR CERVICAL DECOMP/DISCECTOMY FUSION  05/26/2006   PLATE PLACED   BACK SURGERY     CARDIAC CATHETERIZATION  11/20/1999   CORONARY ARTERY BYPASS GRAFT  11/20/1999   SVG-RI1-RI2, SVG-OM, SVG-dRCA   CORONARY STENT INTERVENTION N/A 04/13/2019   Procedure: CORONARY STENT INTERVENTION;  Surgeon: Burnell Blanks, MD;  Location: [facility];  Service: Cardiovascular;  Laterality: N/A;   HERNIA REPAIR     INGUINAL HERNIA REPAIR Left 01/02/2019   Procedure: OPEN LEFT INGUINAL HERNIA REPAIR WITH MESH;  Surgeon: Johnathan Hausen, MD;  Location: [facility];  Service: General;  Laterality: Left;   KNEE ARTHROSCOPY Right 1992; 04/2008   LAPAROSCOPIC CHOLECYSTECTOMY  2007   LEFT HEART CATH AND CORS/GRAFTS ANGIOGRAPHY N/A 04/13/2019   Procedure: LEFT HEART CATH AND CORS/GRAFTS ANGIOGRAPHY;  Surgeon: Burnell Blanks, MD;  Location: [facility];  Service: Cardiovascular;  Laterality: N/A;   NASAL SEPTUM SURGERY Right 2007   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   ROBOT ASSISTED LAPAROSCOPIC RADICAL PROSTATECTOMY  04/2010   THYROIDECTOMY, PARTIAL  1980   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   Procedure: LEFT TOTAL KNEE ARTHROPLASTY;  Surgeon: Melrose Nakayama, MD;   Location: [facility];  Service: Orthopedics;  Laterality: Left;   Allergies  Allergen Reactions   Benadryl [Diphenhydramine] Other (See Comments)    Pt told not to take because of bypass surgery   Bromfed Nausea And Vomiting   Clarithromycin Other (See Comments)     gastritis   Codeine Other (See Comments)    Trouble breathing   Doxycycline Hyclate Nausea And Vomiting   Lexapro [Escitalopram]     Dizziness   Modafinil Other (See Comments)     anxiety-nervousness   Oxycodone-Acetaminophen Itching  Promethazine Hcl Other (See Comments)     fainting   Quinolones Nausea Only    Cipro "felt real bad"   Telithromycin Nausea And Vomiting   Tramadol Nausea Only   Hydrocodone-Acetaminophen Rash   Statins Other (See Comments)    Leg cramps and makes patient feel bad. Per pt tried multiple in years past   Sulfonamide Derivatives Rash      Objective:    Physical Exam Vitals and nursing note reviewed.  Constitutional:      General: He is not in acute distress.    Appearance: Normal appearance.  HENT:     Head: Normocephalic.     Right Ear: Tympanic membrane and ear canal normal.     Left Ear: Tympanic membrane and ear canal normal.     Nose:     Right Sinus: No maxillary sinus tenderness or frontal sinus tenderness.     Left Sinus: No frontal sinus tenderness.     Mouth/Throat:     Mouth: Mucous membranes are moist.     Pharynx: No pharyngeal swelling, oropharyngeal exudate, posterior oropharyngeal erythema or uvula swelling.  Cardiovascular:     Rate and Rhythm: Normal rate and regular rhythm.  Pulmonary:     Effort: Pulmonary effort is normal.     Breath sounds: Normal breath sounds.  Musculoskeletal:        General: Normal range of motion.     Cervical back: Normal range of motion.  Lymphadenopathy:     Head:     Right side of head: No preauricular or posterior auricular adenopathy.     Left side of head: No preauricular or posterior auricular adenopathy.     Cervical:  No cervical adenopathy.  Skin:    General: Skin is warm and dry.  Neurological:     Mental Status: He is alert and oriented to person, place, and time.  Psychiatric:        Mood and Affect: Mood normal.    BP 104/62 (BP Location: Left Arm, Patient Position: Sitting, Cuff Size: Large)   Pulse 80   Temp 97.9 F (36.6 C) (Temporal)   Ht '5\' 6"'$  (1.676 m)   Wt 213 lb (96.6 kg)   SpO2 96%   BMI 34.38 kg/m  Wt Readings from Last 3 Encounters:  11/03/21 213 lb (96.6 kg)  10/01/21 212 lb 6.4 oz (96.3 kg)  09/18/21 214 lb (97.1 kg)       Jeanie Sewer, NP

## 2021-11-05 ENCOUNTER — Ambulatory Visit: Payer: PPO | Admitting: Family Medicine

## 2021-11-06 MED ORDER — AMOXICILLIN-POT CLAVULANATE 875-125 MG PO TABS: 1.0000 | ORAL_TABLET | Freq: Two times a day (BID) | ORAL | 0 refills | Status: DC

## 2021-11-06 NOTE — Telephone Encounter (Signed)
I called pt and Pt states last night he was coughing, sneezing and had a terrible headache. Pt also states he just feels stuffy and he still had a headache waking up this morning. Pt is not coughing and sneezing today. Has been taking tylenol and zinc. No fever but very fatigued.

## 2021-11-06 NOTE — Telephone Encounter (Signed)
I called pt back and Pt gave a verbalized understanding.

## 2021-11-06 NOTE — Telephone Encounter (Signed)
ask about specific sx - ask if any fever - is cough productive? color? is cough worse or sinus symptoms? let me know, thx

## 2021-11-06 NOTE — Telephone Encounter (Signed)
let him know I'm sending an antibiotic, Augmentin as it sounds like he is having a sinus headache from a sinus infection.

## 2021-11-06 NOTE — Addendum Note (Signed)
Addended byJeanie Sewer on: 11/06/2021 04:42 PM   Modules accepted: Orders

## 2021-11-06 NOTE — Telephone Encounter (Signed)
Pt states: -instructed to call in with update how he was feeling -past 24 hours have been worse: developed a cough that is disrupting sleep -if medication to be prescribed, reminder that he is allergic to codeine   Pt requests: -instruction on next steps  -Call back   Routing to Attending provider and PCP     Preferred Pharmacy: [facility] [place] [facility] [address]  [place], [place] [address]  Phone:  [phone]  Fax:  [phone]  DEA #:  [id number]

## 2021-11-12 ENCOUNTER — Other Ambulatory Visit: Payer: Self-pay | Admitting: Internal Medicine

## 2021-11-12 ENCOUNTER — Encounter: Payer: Self-pay | Admitting: Family Medicine

## 2021-11-12 ENCOUNTER — Ambulatory Visit (INDEPENDENT_AMBULATORY_CARE_PROVIDER_SITE_OTHER): Payer: PPO | Admitting: Family Medicine

## 2021-11-12 VITALS — BP 110/64 | HR 77 | Temp 98.0°F | Ht 66.0 in | Wt 214.4 lb

## 2021-11-12 DIAGNOSIS — R7303 Prediabetes: Secondary | ICD-10-CM

## 2021-11-12 DIAGNOSIS — R21 Rash and other nonspecific skin eruption: Secondary | ICD-10-CM | POA: Diagnosis not present

## 2021-11-12 DIAGNOSIS — R739 Hyperglycemia, unspecified: Secondary | ICD-10-CM | POA: Diagnosis not present

## 2021-11-12 DIAGNOSIS — E785 Hyperlipidemia, unspecified: Secondary | ICD-10-CM

## 2021-11-12 DIAGNOSIS — I2510 Atherosclerotic heart disease of native coronary artery without angina pectoris: Secondary | ICD-10-CM

## 2021-11-12 NOTE — Progress Notes (Signed)
Phone [phone] In person visit   Subjective:   Joshua [name] is a 81 y.o. year old very pleasant male patient who presents for/with See problem oriented charting Chief Complaint  Patient presents with   discuss medication    Pt would like to discuss Wegovy.   Testicle Pain    Pt states he is having problems with testicle pain again with burning also, he has seen urology for this.    Past Medical History-  Patient Active Problem List   Diagnosis Date Noted   Coronary atherosclerosis 10/12/2006    Priority: High   Irritable bowel syndrome 10/12/2006    Priority: High   Overactive bladder 11/24/2019    Priority: Medium    Hyperglycemia 10/07/2017    Priority: Medium    Gout 11/20/2016    Priority: Medium    CKD (chronic kidney disease), stage III ([place]) 10/23/2013    Priority: Medium    Neuropathy ([place]) 08/21/2013    Priority: Medium    History of bladder cancer 04/01/2010    Priority: Medium    Essential hypertension 02/21/2007    Priority: Medium    Hyperlipidemia 10/12/2006    Priority: Medium    Major depression in full remission ([place]) 10/12/2006    Priority: Medium    Asthma 10/12/2006    Priority: Medium    Aortic atherosclerosis ([place]) 05/05/2018    Priority: Low   DJD (degenerative joint disease)     Priority: Low   Arthritis     Priority: Low   Neck pain 02/08/2017    Priority: Low   Venous insufficiency 12/29/2016    Priority: Low   Ganglion cyst of tendon sheath of right hand 12/29/2016    Priority: Low   Avulsion fracture of metatarsal bone of right foot 11/25/2016    Priority: Low   Obesity, Class I, BMI 30-34.9 10/03/2015    Priority: Low   Allergic rhinitis 05/31/2014    Priority: Low   Fatigue 11/09/2013    Priority: Low   Dizziness 08/23/2013    Priority: Low   RLS (restless legs syndrome) 01/03/2013    Priority: Low   Chronic bilateral low back pain with left-sided sciatica 10/09/2011    Priority: Low   OSTEOARTHRITIS, LOWER  LEG, LEFT 10/04/2009    Priority: Low   ANEMIA, IRON DEFICIENCY 11/06/2008    Priority: Low   TINNITUS, CHRONIC, 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    Priority: Low   OSA (obstructive sleep apnea) 11/02/2006    Priority: Low   GERD 10/12/2006    Priority: Low   Greater trochanteric bursitis of right hip 02/17/2021   Hyperactive bowel sounds 01/29/2021   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 ([place]) 09/25/2020   Primary osteoarthritis of left knee 06/18/2020   Pre-operative respiratory examination 06/05/2020   Coronary artery disease involving coronary bypass graft of native heart without angina pectoris 04/07/2020   Trigger thumb, left thumb 01/24/2020   Left lateral epicondylitis 09/05/2019   Degenerative arthritis of left knee 08/08/2019   Educated about COVID-19 virus infection 04/20/2019   Unstable angina (HCC)    Greater trochanteric bursitis of left hip 10/27/2018   Degenerative disc disease, lumbar 10/27/2018   Abdominal muscle strain, initial encounter 06/16/2018   Weakness of left leg 06/21/2017   Hereditary and idiopathic peripheral neuropathy 09/25/2014   Generalized abdominal pain 01/31/2009    Medications- reviewed and updated Current Outpatient Medications  Medication Sig Dispense Refill   acetaminophen (TYLENOL) 500 MG tablet  Take 1,000 mg by mouth every 6 (six) hours as needed for moderate pain or headache.     albuterol (PROAIR HFA) 108 (90 BASE) MCG/ACT inhaler Inhale 1-2 puffs into the lungs every 6 (six) hours as needed for wheezing or shortness of breath. 18 g 3   Alirocumab (PRALUENT) 150 MG/ML SOAJ INJECT 1 DOSE INTO THE SKIN EVERY 14 (FOURTEEN) DAYS. 2 mL 11   amLODipine (NORVASC) 5 MG tablet TAKE 1 TABLET (5 MG TOTAL) BY MOUTH DAILY. 90 tablet 3   amoxicillin-clavulanate (AUGMENTIN) 875-125 MG tablet Take 1 tablet by mouth 2 (two) times daily after a meal. 14 tablet 0   aspirin EC 81 MG tablet Take 1 tablet (81 mg total) by mouth 2 (two) times  daily after a meal. 30 tablet 11   busPIRone (BUSPAR) 5 MG tablet Take 1 tablet (5 mg total) by mouth 2 (two) times daily as needed (anxiety). 40 tablet 2   cholecalciferol (VITAMIN D3) 25 MCG (1000 UNIT) tablet Take 1,000 Units by mouth daily.     clotrimazole-betamethasone (LOTRISONE) cream Apply 1 application topically See admin instructions. Twice daily every 4 days     Cyanocobalamin (B-12) 2500 MCG TABS Take 1,250 mcg by mouth daily.     diazepam (VALIUM) 5 MG tablet TAKE 1 TABLET (5 MG TOTAL) BY MOUTH AT BEDTIME AS NEEDED (SLEEP). 30 tablet 1   EPINEPHrine 0.3 mg/0.3 mL IJ SOAJ injection Inject 0.3 mg into the muscle as needed for anaphylaxis. for allergic reaction     ezetimibe (ZETIA) 10 MG tablet TAKE 1 TABLET BY MOUTH EVERY DAY 90 tablet 3   FLOVENT HFA 110 MCG/ACT inhaler Inhale 1 puff into the lungs 2 (two) times daily.   5   fluticasone (FLONASE) 50 MCG/ACT nasal spray Place 1 spray into both nostrils 2 (two) times daily.      folic acid (FOLVITE) 626 MCG tablet Take 800 mcg by mouth daily.      gabapentin (NEURONTIN) 300 MG capsule Take 1 cap in AM, 1 cap at noon, 3 caps at bedtime 450 capsule 3   GEMTESA 75 MG TABS TAKE 1 TABLET BY MOUTH DAILY. 30 tablet 11   Glucos-Chond-Hyal Ac-Ca Fructo (MOVE FREE JOINT HEALTH ADVANCE PO) Take 1 tablet by mouth daily.     hydrochlorothiazide (HYDRODIURIL) 25 MG tablet TAKE 1 TABLET BY MOUTH EVERY DAY 90 tablet 2   ketoconazole (NIZORAL) 2 % cream Apply 1 application topically See admin instructions. Twice a day every other day     Miconazole Nitrate (LOTRIMIN AF DEODORANT POWDER) 2 % AERP Apply 1 spray topically daily as needed (jock itch).     nitroGLYCERIN (NITROSTAT) 0.4 MG SL tablet PLACE 1 TABLET UNDER THE TONGUE EVERY 5 (FIVE) MINUTES AS NEEDED. 75 tablet 1   pantoprazole (PROTONIX) 40 MG tablet TAKE 1 TABLET BY MOUTH EVERY DAY 90 tablet 3   polyethylene glycol powder (GLYCOLAX/MIRALAX) powder Take 17 g by mouth daily. 255 g 0   valsartan  (DIOVAN) 320 MG tablet TAKE 1 TABLET BY MOUTH EVERY DAY 90 tablet 3   Wheat Dextrin (BENEFIBER) POWD Take 1 Dose by mouth daily. 1 dose = 2 teaspoons     No current facility-administered medications for this visit.     Objective:  BP 110/64   Pulse 77   Temp 98 F (36.7 C)   Ht '5\' 6"'$  (1.676 m)   Wt 214 lb 6.4 oz (97.3 kg)   SpO2 96%   BMI 34.61 kg/m  Gen: NAD, resting comfortably CV: RRR no murmurs rubs or gallops Lungs: CTAB no crackles, wheeze, rhonchi Ext: trace edema to 1+ GU: left groin with mild erythema- slightly tender to touch. No exoriation    Assessment and Plan    # Hyperglycemia/insulin resistance/prediabetes S:  Medication: None but interested in medication like Wegovy or Ozempic-discussed potential risks especially with IBS and history of constipation/generalized abdominal pain-has seen GI in the past. Vomiting with fruits/veggies large barrier -younger half brother ended up in hospital after taking but with Type I diabetes Exercise and diet- likes sweets and goes more artificial. Per wife already has small portion sizes. Only eats at meals. Struggles with exercise.  Lab Results  Component Value Date   HGBA1C 5.7 05/13/2021   HGBA1C 5.7 02/06/2021   HGBA1C 6.2 (H) 06/11/2020  A/P: We had a great discussion today about prediabetes and patient's difficulties with weight loss due to physical limitations-already not overeating and limited in options for healthier eating due to prior nausea/vomiting-I was honest with him that I was concerned with IBS history that he would not tolerate Ozempic well and wife was in agreement-ultimately we decided to hold off - Also discussed Jardiance but worried about worsening groin irritation due to incontinence and also opted out -Discussed plan could certainly change if Z6S increases in the future significantly  #Left scrotum pain and redness S: Patient has seen urology in the past for left groin pain-first noted September 05, 2018 and  ultrasound of scrotum unrevealing.  Ultimately was found to have a hernia and had repair October 2020. Most recently mentioned on 05/03/2020 and he saw Dr. Josie Saunders possibly related to nerve impingement from scar tissue from open hernia repair versus pain from degenerative disc disease- burning pain. Had testicular pain before but this is more scrotum pain.[name] Also with history of penile pain.  Pelvic floor therapy was helpful in the past.    Saw urology 10/23/2021 with complaint of severe burning of the scrotum and groin areas-for about 2 weeks-Silvadene cream without improvement as well as steroid cream without improvement.  No testicular pain.  Baseline incontinence issues and wears a pad but trains out frequently to avoid moisture buildup.  Microscopic urinalysis was reassuring.  They noted mild erythema of the scrotum on the left greater than the right with no significant Excoriation.  Zinc oxide powder was recommended-they listed as tinea cruris -seen by Dr. Milford Cage. Ammen's zinc Powder was not helpful after a few days. Went back to old medication from Dr. Ronnald Ramp- silvadene and hydrocortisone ointment- improving slightly. Urology said no hernia. Didn't originally change pads out as quickly but is doing this as soon as gets wet now and helping  A/P: Patient reports improvement with Silvadene/hydrocortisone (previously prescribed by dermatology)that has been more beneficial than Ammen's zinc oxide powder (recommended by urology more recently)-with improvement we opted to continue current medication.  Thankful patient is changing pads more quickly.  Appears Silvadene per at least 1 article-wound has antifungal benefit which would cover tinea cruris as mentioned by Dr. Marliss Coots could also have him follow-up with dermatology if needed  #Cough/sinusitis- improved after antibiotics- feeling better overall.    Recommended follow up: Return for next already scheduled visit or sooner if needed. Future  Appointments  Date Time Provider Amaya  11/17/2021  4:00 PM Lyndal Pulley, DO [facility] None  01/01/2022  9:20 AM [name], MD [facility]  04/27/2022 10:00 AM Cameron Sprang, MD [facility] None  06/04/2022  8:45 AM  [facility] HEALTH COACH [facility]   Lab/Order associations:   ICD-10-CM   1. Hyperglycemia  R73.9     2. Prediabetes  R73.03     3. Groin rash  R21      Time Spent: 31 minutes of total time (8:40 AM- 9:11 AM) was spent on the date of the encounter performing the following actions: chart review prior to seeing the patient, obtaining history, performing a medically necessary exam, counseling on the treatment plan options and ultimate plan-particular around benefits versus risks of Ozempic in light of his baseline health/sensitivity to medicine, and documenting in our EHR.    Return precautions advised.  [name], MD

## 2021-11-12 NOTE — Patient Instructions (Addendum)
Flu shot- we should have these available within a month or two but please let us know if you get at outside pharmacy -can get rsv vaccine at pharmacy now if available  Hold off on ozempic and jardiance  Continue silvadene and hydrocortisone since improving on this  Recommended follow up: Return for next already scheduled visit or sooner if needed.  Labs next visit

## 2021-11-13 DIAGNOSIS — J3089 Other allergic rhinitis: Secondary | ICD-10-CM | POA: Diagnosis not present

## 2021-11-13 DIAGNOSIS — J301 Allergic rhinitis due to pollen: Secondary | ICD-10-CM | POA: Diagnosis not present

## 2021-11-13 DIAGNOSIS — J3081 Allergic rhinitis due to animal (cat) (dog) hair and dander: Secondary | ICD-10-CM | POA: Diagnosis not present

## 2021-11-14 NOTE — Progress Notes (Unsigned)
Joshua [name] [address] [place] Phone: [phone] Subjective:   Joshua [name], am serving as a scribe for Dr. Hulan Saas.  I'm seeing this patient by the request  of:  [name], MD  CC: right hip pain   [id number]  09/05/2021 Injection given and tolerated the procedure well, discussed icing regimen and home exercises, which activities to do and which ones to avoid, increase activity very slowly after the post PRP.  Updated 11/17/2021 Joshua [name] is a 81 y.o. male coming in with complaint of right hip pain. PRP f/u. Hips are doing well, PRP is doing well. Back and knee pain is still persistent.  Everything else is fine.       Past Medical History:  Diagnosis Date   Acute medial meniscal tear    Anemia 2015   Arthritis    "middle finger right hand; right knee; neck" (02/06/2014)   Asthma    seasonal   Bladder cancer ([place]) 04/2010   "cauterized during prostate OR"   CAD (coronary artery disease)    CABG 2001   Diverticulosis    DJD (degenerative joint disease)    BACK   GERD (gastroesophageal reflux disease)    H/O inguinal hernia repair 12/2018   History of gout    Hyperlipidemia    Hypertension    IBS (irritable bowel syndrome)    Obesity    OSA (obstructive sleep apnea)    USES CPAP    Pancreatitis ~ 1980   Peripheral vascular disease ([place])    Pre-diabetes    Prostate cancer ([place]) 04/2010   Past Surgical History:  Procedure Laterality Date   ANTERIOR CERVICAL DECOMP/DISCECTOMY FUSION  05/26/2006   PLATE PLACED   BACK SURGERY     CARDIAC CATHETERIZATION  11/20/1999   CORONARY ARTERY BYPASS GRAFT  11/20/1999   SVG-RI1-RI2, SVG-OM, SVG-dRCA   CORONARY STENT INTERVENTION N/A 04/13/2019   Procedure: CORONARY STENT INTERVENTION;  Surgeon: Burnell Blanks, MD;  Location: [facility];  Service: Cardiovascular;  Laterality: N/A;   HERNIA REPAIR     INGUINAL HERNIA REPAIR Left  01/02/2019   Procedure: OPEN LEFT INGUINAL HERNIA REPAIR WITH MESH;  Surgeon: Johnathan Hausen, MD;  Location: [facility];  Service: General;  Laterality: Left;   KNEE ARTHROSCOPY Right 1992; 04/2008   LAPAROSCOPIC CHOLECYSTECTOMY  2007   LEFT HEART CATH AND CORS/GRAFTS ANGIOGRAPHY N/A 04/13/2019   Procedure: LEFT HEART CATH AND CORS/GRAFTS ANGIOGRAPHY;  Surgeon: Burnell Blanks, MD;  Location: [facility];  Service: Cardiovascular;  Laterality: N/A;   NASAL SEPTUM SURGERY Right 2007   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   ROBOT ASSISTED LAPAROSCOPIC RADICAL PROSTATECTOMY  04/2010   THYROIDECTOMY, PARTIAL  1980   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   Procedure: LEFT TOTAL KNEE ARTHROPLASTY;  Surgeon: Melrose Nakayama, MD;  Location: [facility];  Service: Orthopedics;  Laterality: Left;   Social History   Socioeconomic History   Marital status: Married    Spouse name: Not on file   Number of children: Not on file   Years of education: Not on file   Highest education level: Not on file  Occupational History   Occupation: retired    Fish farm manager: RETIRED  Tobacco Use   Smoking status: Former    Packs/day: 1.00    Years: 35.00    Total pack years: 35.00    Types: Cigarettes    Quit date: 06/16/1992  Years since quitting: 29.4   Smokeless tobacco: Never  Vaping Use   Vaping Use: Never used  Substance and Sexual Activity   Alcohol use: Yes    Alcohol/week: 2.0 standard drinks of alcohol    Types: 2 Shots of liquor per week    Comment: occ   Drug use: No   Sexual activity: Not Currently    Partners: Female  Other Topics Concern   Not on file  Social History Narrative   Married 42 years in 2015. No kids (mumps at age 89)      Retired from [employer] in Engineer, technical sales.  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      Hobbies: poker, movies and tv, staying active      Right handed   2 level home with steps he uses   Social Determinants of Radio broadcast assistant Strain: Low  Risk  (05/22/2021)   Overall Financial Resource Strain (CARDIA)    Difficulty of Paying Living Expenses: Not hard at all  Food Insecurity: No Food Insecurity (05/22/2021)   Hunger Vital Sign    Worried About Running Out of Food in the Last Year: Never true    Cowlic in the Last Year: Never true  Transportation Needs: No Transportation Needs (05/22/2021)   PRAPARE - Hydrologist (Medical): No    Lack of Transportation (Non-Medical): No  Physical Activity: Inactive (05/22/2021)   Exercise Vital Sign    Days of Exercise per Week: 0 days    Minutes of Exercise per Session: 0 min  Stress: No Stress Concern Present (05/22/2021)   [place]    Feeling of Stress : Only a little  Social Connections: Moderately Integrated (05/22/2021)   Social Connection and Isolation Panel [NHANES]    Frequency of Communication with Friends and Family: More than three times a week    Frequency of Social Gatherings with Friends and Family: More than three times a week    Attends Religious Services: Never    Marine scientist or Organizations: Yes    Attends Music therapist: 1 to 4 times per year    Marital Status: Married   Allergies  Allergen Reactions   Benadryl [Diphenhydramine] Other (See Comments)    Pt told not to take because of bypass surgery   Bromfed Nausea And Vomiting   Clarithromycin Other (See Comments)     gastritis   Codeine Other (See Comments)    Trouble breathing   Doxycycline Hyclate Nausea And Vomiting   Lexapro [Escitalopram]     Dizziness   Modafinil Other (See Comments)     anxiety-nervousness   Oxycodone-Acetaminophen Itching   Promethazine Hcl Other (See Comments)     fainting   Quinolones Nausea Only    Cipro "felt real bad"   Telithromycin Nausea And Vomiting   Tramadol Nausea Only   Hydrocodone-Acetaminophen Rash   Statins Other (See Comments)    Leg  cramps and makes patient feel bad. Per pt tried multiple in years past   Sulfonamide Derivatives Rash   Family History  Problem Relation Age of Onset   Heart disease Mother    Hyperlipidemia Mother    Heart disease Father    Hyperlipidemia Father    Diabetes Brother    Colon cancer Neg Hx    Pancreatic cancer Neg Hx    Esophageal cancer Neg Hx    Stomach  cancer Neg Hx    Liver cancer Neg Hx      Current Outpatient Medications (Cardiovascular):    Alirocumab (PRALUENT) 150 MG/ML SOAJ, INJECT 1 DOSE INTO THE SKIN EVERY 14 (FOURTEEN) DAYS.   amLODipine (NORVASC) 5 MG tablet, TAKE 1 TABLET (5 MG TOTAL) BY MOUTH DAILY.   EPINEPHrine 0.3 mg/0.3 mL IJ SOAJ injection, Inject 0.3 mg into the muscle as needed for anaphylaxis. for allergic reaction   ezetimibe (ZETIA) 10 MG tablet, TAKE 1 TABLET BY MOUTH EVERY DAY   hydrochlorothiazide (HYDRODIURIL) 25 MG tablet, TAKE 1 TABLET BY MOUTH EVERY DAY   nitroGLYCERIN (NITROSTAT) 0.4 MG SL tablet, PLACE 1 TABLET UNDER THE TONGUE EVERY 5 (FIVE) MINUTES AS NEEDED.   valsartan (DIOVAN) 320 MG tablet, TAKE 1 TABLET BY MOUTH EVERY DAY  Current Outpatient Medications (Respiratory):    albuterol (PROAIR HFA) 108 (90 BASE) MCG/ACT inhaler, Inhale 1-2 puffs into the lungs every 6 (six) hours as needed for wheezing or shortness of breath.   FLOVENT HFA 110 MCG/ACT inhaler, Inhale 1 puff into the lungs 2 (two) times daily.    fluticasone (FLONASE) 50 MCG/ACT nasal spray, Place 1 spray into both nostrils 2 (two) times daily.   Current Outpatient Medications (Analgesics):    acetaminophen (TYLENOL) 500 MG tablet, Take 1,000 mg by mouth every 6 (six) hours as needed for moderate pain or headache.   aspirin EC 81 MG tablet, Take 1 tablet (81 mg total) by mouth 2 (two) times daily after a meal.  Current Outpatient Medications (Hematological):    Cyanocobalamin (B-12) 2500 MCG TABS, Take 1,250 mcg by mouth daily.   folic acid (FOLVITE) 161 MCG tablet, Take 800  mcg by mouth daily.   Current Outpatient Medications (Other):    amoxicillin-clavulanate (AUGMENTIN) 875-125 MG tablet, Take 1 tablet by mouth 2 (two) times daily after a meal.   busPIRone (BUSPAR) 5 MG tablet, Take 1 tablet (5 mg total) by mouth 2 (two) times daily as needed (anxiety).   cholecalciferol (VITAMIN D3) 25 MCG (1000 UNIT) tablet, Take 1,000 Units by mouth daily.   clotrimazole-betamethasone (LOTRISONE) cream, Apply 1 application topically See admin instructions. Twice daily every 4 days   diazepam (VALIUM) 5 MG tablet, TAKE 1 TABLET (5 MG TOTAL) BY MOUTH AT BEDTIME AS NEEDED (SLEEP).   gabapentin (NEURONTIN) 300 MG capsule, Take 1 cap in AM, 1 cap at noon, 3 caps at bedtime   GEMTESA 75 MG TABS, TAKE 1 TABLET BY MOUTH DAILY.   Glucos-Chond-Hyal Ac-Ca Fructo (MOVE FREE JOINT HEALTH ADVANCE PO), Take 1 tablet by mouth daily.   ketoconazole (NIZORAL) 2 % cream, Apply 1 application topically See admin instructions. Twice a day every other day   Miconazole Nitrate (LOTRIMIN AF DEODORANT POWDER) 2 % AERP, Apply 1 spray topically daily as needed (jock itch).   pantoprazole (PROTONIX) 40 MG tablet, TAKE 1 TABLET BY MOUTH EVERY DAY   polyethylene glycol powder (GLYCOLAX/MIRALAX) powder, Take 17 g by mouth daily.   Wheat Dextrin (BENEFIBER) POWD, Take 1 Dose by mouth daily. 1 dose = 2 teaspoons   Reviewed prior external information including notes and imaging from  primary care provider As well as notes that were available from care everywhere and other healthcare systems.  Past medical history, social, surgical and family history all reviewed in electronic medical record.  No pertanent information unless stated regarding to the chief complaint.   Review of Systems:  No headache, visual changes, nausea, vomiting, diarrhea, constipation, dizziness, abdominal pain, skin  rash, fevers, chills, night sweats, weight loss, swollen lymph nodes,  joint swelling, chest pain, shortness of breath,  mood changes. POSITIVE muscle aches, body aches  Objective  Blood pressure 126/70, pulse 86, height '5\' 6"'$  (1.676 m), weight 214 lb (97.1 kg), SpO2 97 %.   General: No apparent distress alert and oriented x3 mood and affect normal, dressed appropriately.  HEENT: Pupils equal, extraocular movements intact  Respiratory: Patient's speak in full sentences and does not appear short of breath  Cardiovascular: No lower extremity edema, non tender, no erythema  Knee exam shows still shows trace effusion noted of the replacement. Antalgic gait noted.  Loss of lordosis of the back.  Diffusely tender to palpation in multiple areas.     Impression and Recommendations:    The above documentation has been reviewed and is accurate and complete Lyndal Pulley, DO

## 2021-11-17 ENCOUNTER — Ambulatory Visit: Payer: PPO | Admitting: Family Medicine

## 2021-11-17 VITALS — BP 126/70 | HR 86 | Ht 66.0 in | Wt 214.0 lb

## 2021-11-17 DIAGNOSIS — M7061 Trochanteric bursitis, right hip: Secondary | ICD-10-CM | POA: Diagnosis not present

## 2021-11-17 DIAGNOSIS — G629 Polyneuropathy, unspecified: Secondary | ICD-10-CM | POA: Diagnosis not present

## 2021-11-17 DIAGNOSIS — G609 Hereditary and idiopathic neuropathy, unspecified: Secondary | ICD-10-CM | POA: Diagnosis not present

## 2021-11-17 MED ORDER — DULOXETINE HCL 30 MG PO CPEP: 30.0000 mg | ORAL_CAPSULE | Freq: Every day | ORAL | 1 refills | Status: DC

## 2021-11-17 NOTE — Assessment & Plan Note (Signed)
Significant improvement with the PRP.  Likely now that this does work relatively well.  Can repeat if necessary.

## 2021-11-17 NOTE — Patient Instructions (Addendum)
$'30mg'D$  Cymbalta Decrease gabapentin to '300mg'$  in AM and PM and '600mg'$  at night After a week on Cymbalta see if you can stop mid day dose of gabapentin

## 2021-11-17 NOTE — Assessment & Plan Note (Signed)
Continues to have peripheral neuropathy and significant amount of pain.  I do think there is some underlying osteo arthritic changes noted as well.  Patient is at this point would like to try Cymbalta.  Viewed patient's chart and did try it many years ago.  Patient is looking for anything to help him with some of his pain.  Warned of potential side effects.  Hopefully patient does not have any of them at this time.  Knows that if it does to discontinue immediately.  We will have patient try to titrate a little off of the gabapentin as well with patient's kidney function being borderline.  We will follow-up with me again in 2 months

## 2021-11-19 DIAGNOSIS — J3081 Allergic rhinitis due to animal (cat) (dog) hair and dander: Secondary | ICD-10-CM | POA: Diagnosis not present

## 2021-11-19 DIAGNOSIS — J301 Allergic rhinitis due to pollen: Secondary | ICD-10-CM | POA: Diagnosis not present

## 2021-11-19 DIAGNOSIS — J3089 Other allergic rhinitis: Secondary | ICD-10-CM | POA: Diagnosis not present

## 2021-11-21 ENCOUNTER — Telehealth: Payer: Self-pay

## 2021-11-21 MED ORDER — DULOXETINE HCL 20 MG PO CPEP: 20.0000 mg | ORAL_CAPSULE | Freq: Every day | ORAL | 1 refills | Status: DC

## 2021-11-21 NOTE — Telephone Encounter (Signed)
Pt of Dr. Tamala Julian calling reports he is overall not feeling well; stomach pain, not sleeping well, very fatigued, and just overall not feeling well. Pt reports Dr. Tamala Julian was trying to wean him off of the gabapentin and start him on Cymbalta, but he doesn't seem to reacting well to it.   Pt did not that he has taking Lyrica in the past, but Dr. Tamala Julian didn't want to try that rx.   Please advise

## 2021-11-21 NOTE — Telephone Encounter (Signed)
I spoke with Joshua [name].  He is having fatigue in the morning after taking his 30 mg of Cymbalta.  Additionally is having some GI upset. Plan to decrease Cymbalta dose to 20 mg and advised him to take it in the evening.  This should help with fatigue and trouble sleeping.  Also advised him to increase his gabapentin dose back up to his previous level.  Additionally has scheduled a follow-up visit with him next week on Wednesday the sixth in the morning.

## 2021-11-21 NOTE — Addendum Note (Signed)
Addended by: Gregor Hams on: 11/21/2021 09:14 AM   Modules accepted: Orders

## 2021-11-25 ENCOUNTER — Ambulatory Visit: Payer: PPO | Admitting: Family Medicine

## 2021-11-25 VITALS — BP 116/72 | HR 75 | Ht 66.0 in | Wt 213.0 lb

## 2021-11-25 DIAGNOSIS — G894 Chronic pain syndrome: Secondary | ICD-10-CM | POA: Diagnosis not present

## 2021-11-25 DIAGNOSIS — G629 Polyneuropathy, unspecified: Secondary | ICD-10-CM

## 2021-11-25 NOTE — Patient Instructions (Signed)
Thank you for coming in today.   The Pain Management Workbook: Powerful CBT and Mindfulness Skills to Take Control of Pain and Reclaim Your Life by Charlaine Dalton MS PhD   https://www.schmidt.com/  https://www.zoffness.com/  Try reducing Gabapentin.   Continue Cymbalta.   Let me know how it goes.

## 2021-11-25 NOTE — Progress Notes (Signed)
   I, Joshua [name], LAT, ATC acting as a scribe for Joshua Leader, MD.  Joshua [name] is a 81 y.o. male who presents to [place] at [facility] today for f/u rx management for neuropathy and R hip pain. Pt was last seen by Joshua [name] on 11/17/21 and pt was prescribed Cymbalta and to titrate off of the gabapentin. Pt called the office on 11/21/21 c/o fatigue, not sleeping well, and stomach pain. Dr. Georgina [name] called pt and was advised to decrease Cymbalta dose to '20mg'$  and to try taking it in the evening and increase Gabapentin dose to previous level. Today, pt reports pain seems to be better controled w/ the Cymbalta. Pt c/o sleeping a lot, fatigue, tiredness, and a sharp pain across the upper quadrant of his torso. No nausea or vomiting. Pt notes his incontinence is improved.   Dx imaging: 05/24/21 L-spine MRI  03/28/21 L-spine XR  02/17/21 R hip XR   Pertinent review of systems: No fevers or chills  Relevant historical information: Neuropathy managed with high-dose gabapentin.  Sleep apnea.   Exam:  BP 116/72   Pulse 75   Ht '5\' 6"'$  (1.676 m)   Wt 213 lb (96.6 kg)   SpO2 96%   BMI 34.38 kg/m  General: Well Developed, well nourished, and in no acute distress.   MSK: Normal gait    Assessment and Plan: 81 y.o. male with chronic pain and neuropathy.  This has been previously managed with relatively high doses of gabapentin up to 1500 mg/day.  Dr. Tamala [name] attempted to reduce reliance of gabapentin by adding Cymbalta at 30 mg daily.  This was challenging and on Friday, September 1 I reduced Cymbalta to 20 mg and clarified the instructions to try taking the Cymbalta at bedtime.  He notes that he has had much less knee pain on the Cymbalta but is still feeling very fatigued and having some abdominal discomfort or pain.  Spent quite a bit of time talking about it.  Cymbalta does seem to be a bit promising initially so it is appealing.   Joshua [name] will try to take the Cymbalta at bedtime  along with a slightly reduced dose of gabapentin for the next few weeks.  If he can tolerate it it which should be helpful.  However if he just cannot tolerate it we will have to abandon Cymbalta and try something else.  I also talked about cognitive behavioral therapy for chronic pain management and I did recommend a book.  Total encounter time 30 minutes including face-to-face time with the patient and, reviewing past medical record, and charting on the date of service.       Discussed warning signs or symptoms. Please see discharge instructions. Patient expresses understanding.   The above documentation has been reviewed and is accurate and complete Joshua [name], M.D.

## 2021-11-26 ENCOUNTER — Ambulatory Visit: Payer: PPO | Admitting: Family Medicine

## 2021-11-26 DIAGNOSIS — J3081 Allergic rhinitis due to animal (cat) (dog) hair and dander: Secondary | ICD-10-CM | POA: Diagnosis not present

## 2021-11-26 DIAGNOSIS — G4733 Obstructive sleep apnea (adult) (pediatric): Secondary | ICD-10-CM | POA: Diagnosis not present

## 2021-11-26 DIAGNOSIS — J301 Allergic rhinitis due to pollen: Secondary | ICD-10-CM | POA: Diagnosis not present

## 2021-11-26 DIAGNOSIS — J3089 Other allergic rhinitis: Secondary | ICD-10-CM | POA: Diagnosis not present

## 2021-12-03 ENCOUNTER — Telehealth: Payer: Self-pay | Admitting: Pharmacist

## 2021-12-03 DIAGNOSIS — J3089 Other allergic rhinitis: Secondary | ICD-10-CM | POA: Diagnosis not present

## 2021-12-03 DIAGNOSIS — J3081 Allergic rhinitis due to animal (cat) (dog) hair and dander: Secondary | ICD-10-CM | POA: Diagnosis not present

## 2021-12-03 DIAGNOSIS — J301 Allergic rhinitis due to pollen: Secondary | ICD-10-CM | POA: Diagnosis not present

## 2021-12-03 NOTE — Progress Notes (Signed)
Chronic Care Management Pharmacy Assistant   Name: Joshua [name]  MRN: [id number] DOB: 01-03-1941   Reason for Encounter: Hypertension Adherence Call    Recent office visits:  11/12/2021 OV (PCP) Joshua Olp, MD; opted out of [place] and [place]  Recent consult visits:  11/25/2021 OV (Sports Medicine) Joshua Hams, MD; Joshua [name] will try to take the Cymbalta at bedtime along with a slightly reduced dose of gabapentin for the next few weeks.  11/17/2021 OV (Sports Medicine) Joshua Hams, MD;  Patient is at this point would like to try [facility] visits:  None in previous 6 months  Medications: Outpatient Encounter Medications as of 12/03/2021  Medication Sig Note   acetaminophen (TYLENOL) 500 MG tablet Take 1,000 mg by mouth every 6 (six) hours as needed for moderate pain or headache.    albuterol (PROAIR HFA) 108 (90 BASE) MCG/ACT inhaler Inhale 1-2 puffs into the lungs every 6 (six) hours as needed for wheezing or shortness of breath.    Alirocumab (PRALUENT) 150 MG/ML SOAJ INJECT 1 DOSE INTO THE SKIN EVERY 14 (FOURTEEN) DAYS.    amLODipine (NORVASC) 5 MG tablet TAKE 1 TABLET (5 MG TOTAL) BY MOUTH DAILY.    aspirin EC 81 MG tablet Take 1 tablet (81 mg total) by mouth 2 (two) times daily after a meal. 11/01/2020: Patient is taking only 1 tab/day   busPIRone (BUSPAR) 5 MG tablet Take 1 tablet (5 mg total) by mouth 2 (two) times daily as needed (anxiety).    cholecalciferol (VITAMIN D3) 25 MCG (1000 UNIT) tablet Take 1,000 Units by mouth daily.    clotrimazole-betamethasone (LOTRISONE) cream Apply 1 application topically See admin instructions. Twice daily every 4 days    Cyanocobalamin (B-12) 2500 MCG TABS Take 1,250 mcg by mouth daily.    diazepam (VALIUM) 5 MG tablet TAKE 1 TABLET (5 MG TOTAL) BY MOUTH AT BEDTIME AS NEEDED (SLEEP).    DULoxetine (CYMBALTA) 20 MG capsule Take 1 capsule (20 mg total) by mouth at bedtime.    EPINEPHrine 0.3 mg/0.3 mL IJ SOAJ injection  Inject 0.3 mg into the muscle as needed for anaphylaxis. for allergic reaction 01/29/2021: On hand   ezetimibe (ZETIA) 10 MG tablet TAKE 1 TABLET BY MOUTH EVERY DAY    FLOVENT HFA 110 MCG/ACT inhaler Inhale 1 puff into the lungs 2 (two) times daily.     fluticasone (FLONASE) 50 MCG/ACT nasal spray Place 1 spray into both nostrils 2 (two) times daily.     folic acid (FOLVITE) 675 MCG tablet Take 800 mcg by mouth daily.  11/01/2020: Pt is taking 800 mcg unable to find 400 mcg.   gabapentin (NEURONTIN) 300 MG capsule Take 1 cap in AM, 1 cap at noon, 3 caps at bedtime    GEMTESA 75 MG TABS TAKE 1 TABLET BY MOUTH DAILY.    Glucos-Chond-Hyal Ac-Ca Fructo (MOVE FREE JOINT HEALTH ADVANCE PO) Take 1 tablet by mouth daily.    hydrochlorothiazide (HYDRODIURIL) 25 MG tablet TAKE 1 TABLET BY MOUTH EVERY DAY    ketoconazole (NIZORAL) 2 % cream Apply 1 application topically See admin instructions. Twice a day every other day    Miconazole Nitrate (LOTRIMIN AF DEODORANT POWDER) 2 % AERP Apply 1 spray topically daily as needed (jock itch).    nitroGLYCERIN (NITROSTAT) 0.4 MG SL tablet PLACE 1 TABLET UNDER THE TONGUE EVERY 5 (FIVE) MINUTES AS NEEDED. 01/29/2021: On hand   pantoprazole (PROTONIX) 40 MG tablet TAKE 1 TABLET BY MOUTH  EVERY DAY    polyethylene glycol powder (GLYCOLAX/MIRALAX) powder Take 17 g by mouth daily.    valsartan (DIOVAN) 320 MG tablet TAKE 1 TABLET BY MOUTH EVERY DAY    Wheat Dextrin (BENEFIBER) POWD Take 1 Dose by mouth daily. 1 dose = 2 teaspoons    No facility-administered encounter medications on file as of 12/03/2021.   Reviewed chart prior to disease state call. Spoke with patient regarding BP  Recent Office Vitals: BP Readings from Last 3 Encounters:  11/25/21 116/72  11/17/21 126/70  11/12/21 110/64   Pulse Readings from Last 3 Encounters:  11/25/21 75  11/17/21 86  11/12/21 77    Wt Readings from Last 3 Encounters:  11/25/21 213 lb (96.6 kg)  11/17/21 214 lb (97.1 kg)   11/12/21 214 lb 6.4 oz (97.3 kg)     Kidney Function Lab Results  Component Value Date/Time   CREATININE 1.27 05/13/2021 11:01 AM   CREATININE 1.21 02/06/2021 09:26 AM   CREATININE 1.33 (H) 02/23/2020 09:33 AM   CREATININE 1.46 (H) 10/01/2017 05:03 PM   GFR 54.21 (L) 05/13/2021 11:01 AM   GFRNONAA 52 (L) 08/11/2020 12:44 AM   GFRNONAA 51 (L) 02/23/2020 09:33 AM   GFRAA 59 (L) 02/23/2020 09:33 AM       Latest Ref Rng & Units 05/13/2021   11:01 AM 02/06/2021    9:26 AM 09/25/2020   10:38 AM  BMP  Glucose 70 - 99 mg/dL 105  76  94   BUN 6 - 23 mg/dL '18  17  17   '$ Creatinine 0.40 - 1.50 mg/dL 1.27  1.21  1.16   Sodium 135 - 145 mEq/L 136  136  134   Potassium 3.5 - 5.1 mEq/L 4.1  3.9  4.4   Chloride 96 - 112 mEq/L 99  101  97   CO2 19 - 32 mEq/L 32  31  31   Calcium 8.4 - 10.5 mg/dL 10.2  9.8  10.2     Current antihypertensive regimen:  Amlodipine 5 mg daily HCTZ 25 mg daily Valsartan 320 mg daily  How often are you checking your Blood Pressure? infrequently  Current home BP readings: none to report. Patient denies HA, dizziness  What recent interventions/DTPs have been made by any provider to improve Blood Pressure control since last CPP Visit: No recent interventions or DTPs.  Any recent hospitalizations or ED visits since last visit with CPP? No  What diet changes have been made to improve Blood Pressure Control?  Patient states his diet isn't very good.  What exercise is being done to improve your Blood Pressure Control?  Patient states he is unable to exercise due to knee pain.  Adherence Review: Is the patient currently on ACE/ARB medication? Yes Does the patient have >5 day gap between last estimated fill dates? No  Since starting Cymbalta he is not having issues with incontinence. So can he cut back on Gemtesa? How long does it take his body to completely adjust to Cymbalta? He cut back to 1200 mg from 1500 mg of Gabapentin. Can he continue to decrease  Gabapentin by 300 mg down to 900?  Care Gaps: Medicare Annual Wellness: Completed 05/22/2021 Hemoglobin A1C: 5.7% on 05/13/2021 Colonoscopy: Completed 05/07/2021  Future Appointments  Date Time Provider [place]  01/01/2022  9:20 AM Joshua Olp, MD [facility]  01/12/2022  9:00 AM Lyndal Pulley, DO [facility] None  04/27/2022 10:00 AM Cameron Sprang, MD [facility] None  06/04/2022  8:45 AM [facility] HEALTH COACH [facility]   Star Rating Drugs: Valsartan 320 mg last filled 10/08/2021 90 DS  April D Calhoun, [place] Pharmacist Assistant [phone]

## 2021-12-04 NOTE — Chronic Care Management (AMB) (Signed)
I would consult neuro, but I would think he could taper down gabapentin as long as his pain is still tolerable.  The gemtessa in just for his urinary symptoms, so if they are getting better he could try to take less if he wants to.  It can take Cymbalta 4-6 weeks to build up in his system.  Beverly Milch, PharmD, CPP Clinical Pharmacist Practitioner [place] [phone]

## 2021-12-05 ENCOUNTER — Ambulatory Visit (INDEPENDENT_AMBULATORY_CARE_PROVIDER_SITE_OTHER): Payer: PPO | Admitting: Family

## 2021-12-05 ENCOUNTER — Encounter: Payer: Self-pay | Admitting: Family

## 2021-12-05 VITALS — BP 126/81 | HR 76 | Temp 97.9°F | Ht 66.0 in | Wt 210.6 lb

## 2021-12-05 DIAGNOSIS — R059 Cough, unspecified: Secondary | ICD-10-CM

## 2021-12-05 DIAGNOSIS — J209 Acute bronchitis, unspecified: Secondary | ICD-10-CM

## 2021-12-05 LAB — POCT INFLUENZA A/B
Influenza A, POC: NEGATIVE
Influenza B, POC: NEGATIVE

## 2021-12-05 LAB — POC COVID19 BINAXNOW: SARS Coronavirus 2 Ag: NEGATIVE

## 2021-12-05 MED ORDER — AZITHROMYCIN 250 MG PO TABS: ORAL_TABLET | ORAL | 0 refills | Status: AC

## 2021-12-05 MED ORDER — ATROVENT HFA 17 MCG/ACT IN AERS: 2.0000 | INHALATION_SPRAY | Freq: Three times a day (TID) | RESPIRATORY_TRACT | 1 refills | Status: DC

## 2021-12-05 NOTE — Progress Notes (Signed)
Patient ID: Joshua [name], male    DOB: 1940/10/20, 81 y.o.   MRN: [id number]  Chief Complaint  Patient presents with  . Cough    Cough, right eye drainage, nasal congestion, SOB, body aches, chills and fatigue. Present since 9/12 and has been getting worse. Covid negative yesterday.     HPI:      runny nose  Assessment & Plan:     Subjective:    Outpatient Medications Prior to Visit  Medication Sig Dispense Refill  . acetaminophen (TYLENOL) 500 MG tablet Take 1,000 mg by mouth every 6 (six) hours as needed for moderate pain or headache.    . albuterol (PROAIR HFA) 108 (90 BASE) MCG/ACT inhaler Inhale 1-2 puffs into the lungs every 6 (six) hours as needed for wheezing or shortness of breath. 18 g 3  . Alirocumab (PRALUENT) 150 MG/ML SOAJ INJECT 1 DOSE INTO THE SKIN EVERY 14 (FOURTEEN) DAYS. 6 mL 3  . amLODipine (NORVASC) 5 MG tablet TAKE 1 TABLET (5 MG TOTAL) BY MOUTH DAILY. 90 tablet 3  . aspirin EC 81 MG tablet Take 1 tablet (81 mg total) by mouth 2 (two) times daily after a meal. 30 tablet 11  . busPIRone (BUSPAR) 5 MG tablet Take 1 tablet (5 mg total) by mouth 2 (two) times daily as needed (anxiety). 40 tablet 2  . cholecalciferol (VITAMIN D3) 25 MCG (1000 UNIT) tablet Take 1,000 Units by mouth daily.    . clotrimazole-betamethasone (LOTRISONE) cream Apply 1 application topically See admin instructions. Twice daily every 4 days    . Cyanocobalamin (B-12) 2500 MCG TABS Take 1,250 mcg by mouth daily.    . diazepam (VALIUM) 5 MG tablet TAKE 1 TABLET (5 MG TOTAL) BY MOUTH AT BEDTIME AS NEEDED (SLEEP). 30 tablet 1  . DULoxetine (CYMBALTA) 20 MG capsule Take 1 capsule (20 mg total) by mouth at bedtime. 30 capsule 1  . EPINEPHrine 0.3 mg/0.3 mL IJ SOAJ injection Inject 0.3 mg into the muscle as needed for anaphylaxis. for allergic reaction    . ezetimibe (ZETIA) 10 MG tablet TAKE 1 TABLET BY MOUTH EVERY DAY 90 tablet 3  . FLOVENT HFA 110 MCG/ACT inhaler Inhale 1 puff into the  lungs 2 (two) times daily.   5  . fluticasone (FLONASE) 50 MCG/ACT nasal spray Place 1 spray into both nostrils 2 (two) times daily.     . folic acid (FOLVITE) 937 MCG tablet Take 800 mcg by mouth daily.     [name] gabapentin (NEURONTIN) 300 MG capsule Take 1 cap in AM, 1 cap at noon, 3 caps at bedtime 450 capsule 3  . GEMTESA 75 MG TABS TAKE 1 TABLET BY MOUTH DAILY. 30 tablet 11  . Glucos-Chond-Hyal Ac-Ca Fructo (MOVE FREE JOINT HEALTH ADVANCE PO) Take 1 tablet by mouth daily.    . hydrochlorothiazide (HYDRODIURIL) 25 MG tablet TAKE 1 TABLET BY MOUTH EVERY DAY 90 tablet 2  . ketoconazole (NIZORAL) 2 % cream Apply 1 application topically See admin instructions. Twice a day every other day    . Miconazole Nitrate (LOTRIMIN AF DEODORANT POWDER) 2 % AERP Apply 1 spray topically daily as needed (jock itch).    . nitroGLYCERIN (NITROSTAT) 0.4 MG SL tablet PLACE 1 TABLET UNDER THE TONGUE EVERY 5 (FIVE) MINUTES AS NEEDED. 75 tablet 1  . pantoprazole (PROTONIX) 40 MG tablet TAKE 1 TABLET BY MOUTH EVERY DAY 90 tablet 3  . polyethylene glycol powder (GLYCOLAX/MIRALAX) powder Take 17 g by mouth daily. [place]  g 0  . valsartan (DIOVAN) 320 MG tablet TAKE 1 TABLET BY MOUTH EVERY DAY 90 tablet 3  . Wheat Dextrin (BENEFIBER) POWD Take 1 Dose by mouth daily. 1 dose = 2 teaspoons     No facility-administered medications prior to visit.   Past Medical History:  Diagnosis Date  . Acute medial meniscal tear   . Anemia 2015  . Arthritis    "middle finger right hand; right knee; neck" (02/06/2014)  . Asthma    seasonal  . Bladder cancer ([place]) 04/2010   "cauterized during prostate OR"  . CAD (coronary artery disease)    CABG 2001  . Diverticulosis   . DJD (degenerative joint disease)    BACK  . GERD (gastroesophageal reflux disease)   . H/O inguinal hernia repair 12/2018  . History of gout   . Hyperlipidemia   . Hypertension   . IBS (irritable bowel syndrome)   . Obesity   . OSA (obstructive sleep apnea)     USES CPAP   . Pancreatitis ~ 1980  . Peripheral vascular disease ([place])   . Pre-diabetes   . Prostate cancer ([place]) 04/2010   Past Surgical History:  Procedure Laterality Date  . ANTERIOR CERVICAL DECOMP/DISCECTOMY FUSION  05/26/2006   PLATE PLACED  . BACK SURGERY    . CARDIAC CATHETERIZATION  11/20/1999  . CORONARY ARTERY BYPASS GRAFT  11/20/1999   SVG-RI1-RI2, SVG-OM, SVG-dRCA  . CORONARY STENT INTERVENTION N/A 04/13/2019   Procedure: CORONARY STENT INTERVENTION;  Surgeon: Burnell Blanks, MD;  Location: [facility];  Service: Cardiovascular;  Laterality: N/A;  . HERNIA REPAIR    . INGUINAL HERNIA REPAIR Left 01/02/2019   Procedure: OPEN LEFT INGUINAL HERNIA REPAIR WITH MESH;  Surgeon: Johnathan Hausen, MD;  Location: [facility];  Service: General;  Laterality: Left;  . KNEE ARTHROSCOPY Right 1992; 04/2008  . LAPAROSCOPIC CHOLECYSTECTOMY  2007  . LEFT HEART CATH AND CORS/GRAFTS ANGIOGRAPHY N/A 04/13/2019   Procedure: LEFT HEART CATH AND CORS/GRAFTS ANGIOGRAPHY;  Surgeon: Burnell Blanks, MD;  Location: [facility];  Service: Cardiovascular;  Laterality: N/A;  . NASAL SEPTUM SURGERY Right 2007  . 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 ROBOT ASSISTED LAPAROSCOPIC RADICAL PROSTATECTOMY  04/2010  . THYROIDECTOMY, PARTIAL  1980  . 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   Procedure: LEFT TOTAL KNEE ARTHROPLASTY;  Surgeon: Melrose Nakayama, MD;  Location: [facility];  Service: Orthopedics;  Laterality: Left;   Allergies  Allergen Reactions  . 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 Bromfed Nausea And Vomiting  . Clarithromycin Other (See Comments)     gastritis  . Codeine Other (See Comments)    Trouble breathing  . Doxycycline Hyclate Nausea And Vomiting  . Lexapro [Escitalopram]     Dizziness  . Modafinil Other (See Comments)     anxiety-nervousness  . Oxycodone-Acetaminophen Itching  . Promethazine Hcl  Other (See Comments)     fainting  . Quinolones Nausea Only    Cipro "felt real bad"  . Telithromycin Nausea And Vomiting  . Tramadol Nausea Only  . Hydrocodone-Acetaminophen Rash  . Statins Other (See Comments)    Leg cramps and makes patient feel bad. Per pt tried multiple in years past  . Sulfonamide Derivatives Rash      Objective:    Physical Exam Vitals and nursing note reviewed.  Constitutional:      General: He is not in  acute distress.    Appearance: Normal appearance.  HENT:     Head: Normocephalic.  Cardiovascular:     Rate and Rhythm: Normal rate and regular rhythm.  Pulmonary:     Effort: Pulmonary effort is normal.     Breath sounds: Normal breath sounds.  Musculoskeletal:        General: Normal range of motion.     Cervical back: Normal range of motion.  Skin:    General: Skin is warm and dry.  Neurological:     Mental Status: He is alert and oriented to person, place, and time.  Psychiatric:        Mood and Affect: Mood normal.   BP 126/81 (BP Location: Left Arm, Patient Position: Sitting, Cuff Size: Large)   Pulse 76   Temp 97.9 F (36.6 C) (Temporal)   Ht '5\' 6"'$  (1.676 m)   Wt 210 lb 9.6 oz (95.5 kg)   SpO2 96%   BMI 33.99 kg/m  Wt Readings from Last 3 Encounters:  12/05/21 210 lb 9.6 oz (95.5 kg)  11/25/21 213 lb (96.6 kg)  11/17/21 214 lb (97.1 kg)       Jeanie Sewer, NP

## 2021-12-05 NOTE — Patient Instructions (Signed)
It was very nice to see you today!  I have sent over Atrovent inhaler to use to help you cough up the mucus in your chest - use this 3 times per day, 2 puffs, use with the Flovent inhaler.  If this is too expensive, I asked the pharmacy to fill Spiriva handihaler. Also need to use your Albuterol inhaler 3 times per day to help open up your lungs and have less shortness of breath. I have also sent over a Zpack antibiotic.  Drink plenty of fluids!       PLEASE NOTE:  If you had any lab tests please let us know if you have not heard back within a few days. You may see your results on MyChart before we have a chance to review them but we will give you a call once they are reviewed by [place]. If we ordered any referrals today, please let us know if you have not heard from their office within the next week.

## 2021-12-06 MED ORDER — ALBUTEROL SULFATE HFA 108 (90 BASE) MCG/ACT IN AERS: 1.0000 | INHALATION_SPRAY | Freq: Four times a day (QID) | RESPIRATORY_TRACT | 5 refills | Status: AC | PRN

## 2021-12-08 ENCOUNTER — Telehealth: Payer: Self-pay | Admitting: Family Medicine

## 2021-12-08 NOTE — Telephone Encounter (Signed)
Pt taking the reduced dose of Cymbalta per Dr. Georgina Snell. He was also recently prescribed a Z pack for cough/cold symptoms (9/15 OV).  Pt concerned about his level of fatigue and although he seems to want to continue the Cymbalta, he also seems to think this may be the cause ( although he is also sick ).  Pt wanted to get Dr. Clovis Riley thoughts on the med and his current fatigue.

## 2021-12-09 NOTE — Telephone Encounter (Signed)
Spoke to pt and relayed this message. Pt reports he is feeling a little bit better each day. Pt will keep [place] posted.

## 2021-12-09 NOTE — Telephone Encounter (Signed)
The fatigue is a concerning.  It is hard to tell if it is because you are sick or because of the Cymbalta or both.  I did give it a week or 2 and then let me know how it goes.

## 2021-12-13 ENCOUNTER — Other Ambulatory Visit: Payer: Self-pay | Admitting: Family Medicine

## 2021-12-15 NOTE — Telephone Encounter (Signed)
Rx refill request approved per Dr. Corey's orders. 

## 2021-12-17 DIAGNOSIS — J3081 Allergic rhinitis due to animal (cat) (dog) hair and dander: Secondary | ICD-10-CM | POA: Diagnosis not present

## 2021-12-17 DIAGNOSIS — J301 Allergic rhinitis due to pollen: Secondary | ICD-10-CM | POA: Diagnosis not present

## 2021-12-17 DIAGNOSIS — J3089 Other allergic rhinitis: Secondary | ICD-10-CM | POA: Diagnosis not present

## 2021-12-18 ENCOUNTER — Other Ambulatory Visit (HOSPITAL_BASED_OUTPATIENT_CLINIC_OR_DEPARTMENT_OTHER): Payer: Self-pay

## 2021-12-18 MED ORDER — FLUAD QUADRIVALENT 0.5 ML IM PRSY
0.5000 mL | PREFILLED_SYRINGE | Freq: Once | INTRAMUSCULAR | 0 refills | Status: AC
  Filled 2021-12-18: qty 0.5, 1d supply, fill #0

## 2021-12-19 ENCOUNTER — Encounter: Payer: Self-pay | Admitting: Family Medicine

## 2021-12-22 ENCOUNTER — Telehealth: Payer: Self-pay | Admitting: Cardiology

## 2021-12-22 DIAGNOSIS — Z8546 Personal history of malignant neoplasm of prostate: Secondary | ICD-10-CM | POA: Diagnosis not present

## 2021-12-22 NOTE — Telephone Encounter (Signed)
-  Pt called to report off and on chest tightness x 1 week with exertion -Pt denies any other symptoms other than fatigue for the past 5 weeks that he feels is related to a new medication his pcp prescribed. -Pt reported he took 1 nitro last week with relief  -According to pt, he's currently chest tightness. Nurse recommended pt a nitro and report to ER for further evaluations. Pt refused and stated he only want an appointment. Nurse again emphasized the importance of  being evaluated by ER but pt again refused.  Appointment scheduled for tomorrow 10/3 with Sheela Stack.

## 2021-12-22 NOTE — Progress Notes (Signed)
Cardiology Clinic Note   Patient Name: Joshua [name] Date of Encounter: 12/23/2021  Primary Care Provider:  [name], MD Primary Cardiologist:  [name], MD  Patient Profile    Joshua [name] 81 year old male presents the clinic today for evaluation of his chest pain.  Past Medical History    Past Medical History:  Diagnosis Date   Acute medial meniscal tear    Anemia 2015   Arthritis    "middle finger right hand; right knee; neck" (02/06/2014)   Asthma    seasonal   Bladder cancer ([place]) 04/2010   "cauterized during prostate OR"   CAD (coronary artery disease)    CABG 2001   Diverticulosis    DJD (degenerative joint disease)    BACK   GERD (gastroesophageal reflux disease)    H/O inguinal hernia repair 12/2018   History of gout    Hyperlipidemia    Hypertension    IBS (irritable bowel syndrome)    Obesity    OSA (obstructive sleep apnea)    USES CPAP    Pancreatitis ~ 1980   Peripheral vascular disease ([place])    Pre-diabetes    Prostate cancer ([place]) 04/2010   Past Surgical History:  Procedure Laterality Date   ANTERIOR CERVICAL DECOMP/DISCECTOMY FUSION  05/26/2006   PLATE PLACED   BACK SURGERY     CARDIAC CATHETERIZATION  11/20/1999   CORONARY ARTERY BYPASS GRAFT  11/20/1999   SVG-RI1-RI2, SVG-OM, SVG-dRCA   CORONARY STENT INTERVENTION N/A 04/13/2019   Procedure: CORONARY STENT INTERVENTION;  Surgeon: Burnell Blanks, MD;  Location: [facility];  Service: Cardiovascular;  Laterality: N/A;   HERNIA REPAIR     INGUINAL HERNIA REPAIR Left 01/02/2019   Procedure: OPEN LEFT INGUINAL HERNIA REPAIR WITH MESH;  Surgeon: Johnathan Hausen, MD;  Location: [facility];  Service: General;  Laterality: Left;   KNEE ARTHROSCOPY Right 1992; 04/2008   LAPAROSCOPIC CHOLECYSTECTOMY  2007   LEFT HEART CATH AND CORS/GRAFTS ANGIOGRAPHY N/A 04/13/2019   Procedure: LEFT HEART CATH AND CORS/GRAFTS ANGIOGRAPHY;  Surgeon: Burnell Blanks, MD;   Location: [facility];  Service: Cardiovascular;  Laterality: N/A;   NASAL SEPTUM SURGERY Right 2007   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   ROBOT ASSISTED LAPAROSCOPIC RADICAL PROSTATECTOMY  04/2010   THYROIDECTOMY, PARTIAL  1980   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   Procedure: LEFT TOTAL KNEE ARTHROPLASTY;  Surgeon: Melrose Nakayama, MD;  Location: [facility];  Service: Orthopedics;  Laterality: Left;    Allergies  Allergies  Allergen Reactions   Benadryl [Diphenhydramine] Other (See Comments)    Pt told not to take because of bypass surgery   Bromfed Nausea And Vomiting   Clarithromycin Other (See Comments)     gastritis   Codeine Other (See Comments)    Trouble breathing   Doxycycline Hyclate Nausea And Vomiting   Lexapro [Escitalopram]     Dizziness   Modafinil Other (See Comments)     anxiety-nervousness   Oxycodone-Acetaminophen Itching   Promethazine Hcl Other (See Comments)     fainting   Quinolones Nausea Only    Cipro "felt real bad"   Telithromycin Nausea And Vomiting   Tramadol Nausea Only   Hydrocodone-Acetaminophen Rash   Statins Other (See Comments)    Leg cramps and makes patient feel bad. Per pt tried multiple in years past   Sulfonamide Derivatives Rash    History of Present Illness    Joshua [name] has a  PMH of chest pain, coronary atherosclerosis, hypertension, aortic atherosclerosis, venous insufficiency, OSA, asthma, GERD, CKD stage III, hyperlipidemia, dizziness, gout, neck pain, and hyperactive bowel sounds.  He is status post CABG.  He had a negative stress test in 2015.  He was noted to have chest pain 1/20.  He underwent stress testing which showed no ischemia.  Due to recurrent chest discomfort he underwent cardiac catheterization 04/13/2019.  He was noted to have severe triple-vessel coronary disease and is status post 5 vessel bypass.  He was noted to have 5 out of 5 patent grafts.  He was noted to have a proximally occluded RCA.  His  vein graft to his distal RCA was patent.  He received successful PCI with DES to his proximal graft lesion SVG-third marginal.  He was noted to have normal LV function and he was discharged on dual antiplatelet therapy.  He was seen in follow-up by Dr. Percival Spanish on 07/11/2021.  During that time he reported having trouble with his back.  He had presented for back injection that was not helping significantly.  He also noted a brain fog and headache.  He noted sharp shooting type pains in the back of his chest.  He reported fatigue.  He did not have chest discomfort during his stress test.  He denied new shortness of breath, PND orthopnea.  He denied new palpitations presyncope and syncope.  His blood pressure was 130/80 with a pulse of 66.  He contacted the nurse triage line today and reported off-and-on chest tightness x1 week with exertion.  They noted increased fatigue over the last 5 weeks.  He felt this was related to a new medication that was prescribed by his PCP.  He indicated that he had taken 1 nitroglycerin which provided relief.  He indicated that he was chest tightness during the time of call.  It was recommended that he proceed to the emergency department.  He wished to keep his appointment and refused emergency department recommendation.  He presents to the clinic today for evaluation and states for the last several weeks he has been noticing that he has not felt well.  He presented to [facility] and describes symptoms of pneumonia.  He was prescribed azithromycin which did not help with his symptoms.  He does note that his cough is gone away.  He also reports that he started taking Cymbalta around the time when he started feeling bad for help with his neuropathic pain and incontinence.  He has been able to significantly reduce his gabapentin and is having success with his incontinence.  He contacted orthopedics who prescribed the medication and reported his symptoms.  He was told that he may stop the  medication if he felt like the medication was contributing.  He has continued to take the medication.  We reviewed his previous cardiac surgery and catheterization.  He denies exertional type chest discomfort.  He has been able to help his wife around the house and does not notice chest discomfort until after physical activity.  He did have 1 episode about a 1.5 weeks ago that was responsive to nitroglycerin.  He reports that he took nitroglycerin last night for chest discomfort and did not notice relief.  He also notices episodes of bowel movement 2-3 times per day that are loose and increased gas.  We reviewed options for evaluation including echocardiogram, chest x-ray, CBC, BMP, stopping Cymbalta and starting probiotics.  We used shared decision making to defer echocardiogram at this time.  I  will have him stop his Cymbalta, order chest x-ray, CBC and BMP.  We will plan follow-up for 1 month.  Today he denies increased shortness of breath,  fatigue, palpitations, melena, hematuria, hemoptysis, diaphoresis, weakness, presyncope, syncope, orthopnea, and PND.    Home Medications    Prior to Admission medications   Medication Sig Start Date End Date Taking? Authorizing Provider  acetaminophen (TYLENOL) 500 MG tablet Take 1,000 mg by mouth every 6 (six) hours as needed for moderate pain or headache.    [provider]  albuterol (PROAIR HFA) 108 (90 Base) MCG/ACT inhaler Inhale 1-2 puffs into the lungs every 6 (six) hours as needed for wheezing or shortness of breath. 12/06/21   Jeanie Sewer, NP  Alirocumab (PRALUENT) 150 MG/ML SOAJ INJECT 1 DOSE INTO THE SKIN EVERY 14 (FOURTEEN) DAYS. 11/14/21   Hilty, Nadean Corwin, MD  amLODipine (NORVASC) 5 MG tablet TAKE 1 TABLET (5 MG TOTAL) BY MOUTH DAILY. 06/23/21   Hilty, Nadean Corwin, MD  aspirin EC 81 MG tablet Take 1 tablet (81 mg total) by mouth 2 (two) times daily after a meal. 06/18/20   Loni Dolly, PA-C  busPIRone (BUSPAR) 5 MG tablet Take 1 tablet  (5 mg total) by mouth 2 (two) times daily as needed (anxiety). 01/01/21   [name], MD  cholecalciferol (VITAMIN D3) 25 MCG (1000 UNIT) tablet Take 1,000 Units by mouth daily.    [provider]  clotrimazole-betamethasone (LOTRISONE) cream Apply 1 application topically See admin instructions. Twice daily every 4 days    [provider]  Cyanocobalamin (B-12) 2500 MCG TABS Take 1,250 mcg by mouth daily.    [provider]  diazepam (VALIUM) 5 MG tablet TAKE 1 TABLET (5 MG TOTAL) BY MOUTH AT BEDTIME AS NEEDED (SLEEP). 09/18/21   [name], MD  DULoxetine (CYMBALTA) 20 MG capsule TAKE 1 CAPSULE BY MOUTH AT BEDTIME. 12/15/21   Gregor Hams, MD  EPINEPHrine 0.3 mg/0.3 mL IJ SOAJ injection Inject 0.3 mg into the muscle as needed for anaphylaxis. for allergic reaction 04/24/19   [provider]  ezetimibe (ZETIA) 10 MG tablet TAKE 1 TABLET BY MOUTH EVERY DAY 06/23/21   Hilty, Nadean Corwin, MD  FLOVENT HFA 110 MCG/ACT inhaler Inhale 1 puff into the lungs 2 (two) times daily.  07/17/14   [provider]  fluticasone (FLONASE) 50 MCG/ACT nasal spray Place 1 spray into both nostrils 2 (two) times daily.  09/05/12   Ricard Dillon, MD  folic acid (FOLVITE) 409 MCG tablet Take 800 mcg by mouth daily.     [provider]  gabapentin (NEURONTIN) 300 MG capsule Take 1 cap in AM, 1 cap at noon, 3 caps at bedtime 09/02/21   Cameron Sprang, MD  GEMTESA 75 MG TABS TAKE 1 TABLET BY MOUTH DAILY. 05/12/21   [name], MD  Glucos-Chond-Hyal Ac-Ca Fructo (MOVE FREE JOINT HEALTH ADVANCE PO) Take 1 tablet by mouth daily.    [provider]  hydrochlorothiazide (HYDRODIURIL) 25 MG tablet TAKE 1 TABLET BY MOUTH EVERY DAY 08/22/21   [name], MD  ipratropium (ATROVENT HFA) 17 MCG/ACT inhaler Inhale 2 puffs into the lungs 3 (three) times daily. 12/05/21   Jeanie Sewer, NP  ketoconazole (NIZORAL) 2 % cream Apply 1 application topically See  admin instructions. Twice a day every other day 02/01/19   [provider]  Miconazole Nitrate (LOTRIMIN AF DEODORANT POWDER) 2 % AERP Apply 1 spray topically daily as needed (  jock itch).    [provider]  nitroGLYCERIN (NITROSTAT) 0.4 MG SL tablet PLACE 1 TABLET UNDER THE TONGUE EVERY 5 (FIVE) MINUTES AS NEEDED. 03/11/20   [name], MD  pantoprazole (PROTONIX) 40 MG tablet TAKE 1 TABLET BY MOUTH EVERY DAY 07/14/21   Hilty, Nadean Corwin, MD  polyethylene glycol powder (GLYCOLAX/MIRALAX) powder Take 17 g by mouth daily. 08/29/14   Tanna Furry, MD  valsartan (DIOVAN) 320 MG tablet TAKE 1 TABLET BY MOUTH EVERY DAY 07/11/21   [name], MD  Wheat Dextrin (BENEFIBER) POWD Take 1 Dose by mouth daily. 1 dose = 2 teaspoons    [provider]    Family History    Family History  Problem Relation Age of Onset   Heart disease Mother    Hyperlipidemia Mother    Heart disease Father    Hyperlipidemia Father    Diabetes Brother    Colon cancer Neg Hx    Pancreatic cancer Neg Hx    Esophageal cancer Neg Hx    Stomach cancer Neg Hx    Liver cancer Neg Hx    He indicated that his mother is deceased. He indicated that the status of his father is unknown. He indicated that the status of his brother is unknown. He indicated that the status of his neg hx is unknown.  Social History    Social History   Socioeconomic History   Marital status: Married    Spouse name: Not on file   Number of children: Not on file   Years of education: Not on file   Highest education level: Not on file  Occupational History   Occupation: retired    Fish farm manager: RETIRED  Tobacco Use   Smoking status: Former    Packs/day: 1.00    Years: 35.00    Total pack years: 35.00    Types: Cigarettes    Quit date: 06/16/1992    Years since quitting: 29.5   Smokeless tobacco: Never  Vaping Use   Vaping Use: Never used  Substance and Sexual Activity   Alcohol use: Yes    Alcohol/week: 2.0  standard drinks of alcohol    Types: 2 Shots of liquor per week    Comment: occ   Drug use: No   Sexual activity: Not Currently    Partners: Female  Other Topics Concern   Not on file  Social History Narrative   Married 42 years in 2015. No kids (mumps at age 50)      Retired from [employer] in Engineer, technical sales.  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      Hobbies: poker, movies and tv, staying active      Right handed   2 level home with steps he uses   Social Determinants of Radio broadcast assistant Strain: Low Risk  (05/22/2021)   Overall Financial Resource Strain (CARDIA)    Difficulty of Paying Living Expenses: Not hard at all  Food Insecurity: No Food Insecurity (05/22/2021)   Hunger Vital Sign    Worried About Running Out of Food in the Last Year: Never true    [place] in the Last Year: Never true  Transportation Needs: No Transportation Needs (05/22/2021)   PRAPARE - Hydrologist (Medical): No    Lack of Transportation (Non-Medical): No  Physical Activity: Inactive (05/22/2021)   Exercise Vital Sign    Days of Exercise per Week: 0 days    Minutes  of Exercise per Session: 0 min  Stress: No Stress Concern Present (05/22/2021)   [place]    Feeling of Stress : Only a little  Social Connections: Moderately Integrated (05/22/2021)   Social Connection and Isolation Panel [NHANES]    Frequency of Communication with Friends and Family: More than three times a week    Frequency of Social Gatherings with Friends and Family: More than three times a week    Attends Religious Services: Never    Marine scientist or Organizations: Yes    Attends Archivist Meetings: 1 to 4 times per year    Marital Status: Married  Human resources officer Violence: Not At Risk (05/22/2021)   Humiliation, Afraid, Rape, and Kick questionnaire    Fear of Current or Ex-Partner: No    Emotionally  Abused: No    Physically Abused: No    Sexually Abused: No     Review of Systems    General:  No chills, fever, night sweats or weight changes.  Cardiovascular:  No chest pain, dyspnea on exertion, edema, orthopnea, palpitations, paroxysmal nocturnal dyspnea. Dermatological: No rash, lesions/masses Respiratory: No cough, dyspnea Urologic: No hematuria, dysuria Abdominal:   No nausea, vomiting, diarrhea, bright red blood per rectum, melena, or hematemesis Neurologic:  No visual changes, wkns, changes in mental status. All other systems reviewed and are otherwise negative except as noted above.  Physical Exam    VS:  BP 130/62   Pulse 80   Wt 212 lb (96.2 kg)   SpO2 98%   BMI 34.22 kg/m  , BMI Body mass index is 34.22 kg/m. GEN: Well nourished, well developed, in no acute distress. HEENT: normal. Neck: Supple, no JVD, carotid bruits, or masses. Cardiac: RRR, no murmurs, rubs, or gallops. No clubbing, cyanosis, left lower extremity generalized edema none pitting.  Radials/DP/PT 2+ and equal bilaterally.  Respiratory:  Respirations regular and unlabored, clear to auscultation bilaterally. GI: Soft, nontender, nondistended, BS + x 4. MS: no deformity or atrophy. Skin: warm and dry, no rash. Neuro:  Strength and sensation are intact. Psych: Normal affect.  Accessory Clinical Findings    Recent Labs: 05/13/2021: ALT 18; BUN 18; Creatinine, Ser 1.27; Hemoglobin 14.7; Platelets 246.0; Potassium 4.1; Sodium 136   Recent Lipid Panel    Component Value Date/Time   CHOL 129 05/13/2021 1101   CHOL 161 10/22/2020 0835   TRIG 149.0 05/13/2021 1101   HDL 51.60 05/13/2021 1101   HDL 50 10/22/2020 0835   CHOLHDL 3 05/13/2021 1101   VLDL 29.8 05/13/2021 1101   LDLCALC 48 05/13/2021 1101   LDLCALC 85 10/22/2020 0835   LDLCALC 84 02/23/2020 0933   LDLDIRECT 115.0 06/04/2016 0919         ECG personally reviewed by me today-normal sinus rhythm anterior infarct undetermined age 64  bpm- No acute changes  Echocardiogram 05/10/2012  Study Conclusions   Left ventricle: The cavity size was normal. Wall thickness  was increased in a pattern of mild LVH. Systolic function  was normal. The estimated ejection fraction was in the range  of 60% to 65%. Wall motion was normal; there were no  regional wall motion abnormalities. There was an increased  relative contribution of atrial contraction to ventricular  filling.   Assessment & Plan   1.  Chest discomfort, cough-contacted nurse triage line on 12/22/2021.  Chest pain has been apparent for 1 week, on and off and was nitro responsive last week.  Reported chest discomfort at the time of call.  It was recommended that he present to the emergency department for further patient.He wished to defer ED evaluation.  He wishes to defer echocardiogram at this time.  Discomfort appears to be related to Cymbalta use versus pneumonia versus coronary disease. Order chest x-ray Order CBC BMP Discontinue Cymbalta  Coronary artery disease-status post CABG x5.  Underwent cardiac catheterization 04/13/2019.  He was noted to have native RCA proximal occlusion.  He received PCI with DES to his vein graft to distal RCA and was discharged on aspirin and Plavix. Continue aspirin, amlodipine, ezetimibe, hydrochlorothiazide, valsartan Heart healthy low-sodium diet-salty 6 given   Hyperlipidemia-LDL 48 on 05/13/2021 Continue aspirin, ezetimibe Heart healthy low-sodium high-fiber diet Increase physical activity as tolerated  Essential hypertension-BP today 130/62.  Well-controlled at home. Continue amlodipine, HCTZ, valsartan Heart healthy low-sodium diet-salty 6 given Increase physical activity as tolerated     Disposition: Follow-up with Dr. Percival Spanish in 1 months.   Jossie Ng. Sheba Whaling NP-C     12/23/2021, 3:36 PM [address] Office [phone] Fax [phone]  Notice: This  dictation was prepared with Dragon dictation along with smaller phrase technology. Any transcriptional errors that result from this process are unintentional and may not be corrected upon review.  I spent 14 minutes examining this patient, reviewing medications, and using patient centered shared decision making involving her cardiac care.  Prior to her visit I spent greater than 20 minutes reviewing her past medical history,  medications, and prior cardiac tests.

## 2021-12-22 NOTE — Telephone Encounter (Signed)
Pt c/o of Chest Pain: STAT if CP now or developed within 24 hours  1. Are you having CP right now? yes  2. Are you experiencing any other symptoms (ex. SOB, nausea, vomiting, sweating)? Feels like not getting enough oxygen  3. How long have you been experiencing CP? A week  4. Is your CP continuous or coming and going? Comes and goes with activity  5. Have you taken Nitroglycerin? Last week   Patient states he has been having chest tightness that has been coming on with activity, but says he does still have some tightness now. He says he is not SOB, but feels like he is not getting enough oxygen. He says when he checked his oxygen it was 94%.  ?

## 2021-12-23 ENCOUNTER — Ambulatory Visit
Admission: RE | Admit: 2021-12-23 | Discharge: 2021-12-23 | Disposition: A | Discharge status: Home / Self Care | Payer: PPO | Source: Ambulatory Visit | Attending: General Practice | Admitting: General Practice

## 2021-12-23 ENCOUNTER — Ambulatory Visit: Payer: PPO | Attending: General Practice | Admitting: General Practice

## 2021-12-23 ENCOUNTER — Encounter: Payer: Self-pay | Admitting: General Practice

## 2021-12-23 VITALS — BP 130/62 | HR 80 | Wt 212.0 lb

## 2021-12-23 DIAGNOSIS — R0789 Other chest pain: Secondary | ICD-10-CM

## 2021-12-23 DIAGNOSIS — I2510 Atherosclerotic heart disease of native coronary artery without angina pectoris: Secondary | ICD-10-CM | POA: Diagnosis not present

## 2021-12-23 DIAGNOSIS — G4733 Obstructive sleep apnea (adult) (pediatric): Secondary | ICD-10-CM | POA: Diagnosis not present

## 2021-12-23 DIAGNOSIS — R079 Chest pain, unspecified: Secondary | ICD-10-CM | POA: Diagnosis not present

## 2021-12-23 DIAGNOSIS — E785 Hyperlipidemia, unspecified: Secondary | ICD-10-CM | POA: Diagnosis not present

## 2021-12-23 DIAGNOSIS — R059 Cough, unspecified: Secondary | ICD-10-CM

## 2021-12-23 DIAGNOSIS — R0602 Shortness of breath: Secondary | ICD-10-CM | POA: Diagnosis not present

## 2021-12-23 DIAGNOSIS — I10 Essential (primary) hypertension: Secondary | ICD-10-CM | POA: Diagnosis not present

## 2021-12-23 NOTE — Patient Instructions (Signed)
Medication Instructions:  STOP CYMBALTA  PLEASE START TAKING PROBIOTICS *If you need a refill on your cardiac medications before your next appointment, please call your pharmacy*   Lab Work: CBC AND BMET TODAY If you have labs (blood work) drawn today and your tests are completely normal, you will receive your results only by:  [place] (if you have MyChart) OR A paper copy in the mail  If you have any lab test that is abnormal or we need to change your treatment, we will call you to review the results.  Other Instructions PLEASE READ AND FOLLOW ATTACHED SALTY 6  PLEASE INCREASE PHYSICAL ACTIVITY AS TOLERATED  Follow-Up: At [facility], you and your health needs are our priority.  As part of our continuing mission to provide you with exceptional heart care, we have created designated Provider Care Teams.  These Care Teams include your primary Cardiologist (physician) and Advanced Practice Providers (APPs -  Physician Assistants and Nurse Practitioners) who all work together to provide you with the care you need, when you need it.  Your next appointment:   1 month(s)  The format for your next appointment:   In Person  Provider:   [name], MD  or Coletta Memos, FNP       Important Information About Sugar

## 2021-12-24 DIAGNOSIS — J3089 Other allergic rhinitis: Secondary | ICD-10-CM | POA: Diagnosis not present

## 2021-12-24 DIAGNOSIS — J301 Allergic rhinitis due to pollen: Secondary | ICD-10-CM | POA: Diagnosis not present

## 2021-12-24 DIAGNOSIS — J3081 Allergic rhinitis due to animal (cat) (dog) hair and dander: Secondary | ICD-10-CM | POA: Diagnosis not present

## 2021-12-24 LAB — CBC
Hematocrit: 42.8 % (ref 37.5–51.0)
Hemoglobin: 14.8 g/dL (ref 13.0–17.7)
MCH: 30.6 pg (ref 26.6–33.0)
MCHC: 34.6 g/dL (ref 31.5–35.7)
MCV: 89 fL (ref 79–97)
Platelets: 274 10*3/uL (ref 150–450)
RBC: 4.83 x10E6/uL (ref 4.14–5.80)
RDW: 11.9 % (ref 11.6–15.4)
WBC: 7.6 10*3/uL (ref 3.4–10.8)

## 2021-12-24 LAB — BASIC METABOLIC PANEL
BUN/Creatinine Ratio: 17 (ref 10–24)
BUN: 21 mg/dL (ref 8–27)
CO2: 22 mmol/L (ref 20–29)
Calcium: 9.4 mg/dL (ref 8.6–10.2)
Chloride: 100 mmol/L (ref 96–106)
Creatinine, Ser: 1.26 mg/dL (ref 0.76–1.27)
Glucose: 104 mg/dL — ABNORMAL HIGH (ref 70–99)
Potassium: 4.3 mmol/L (ref 3.5–5.2)
Sodium: 140 mmol/L (ref 134–144)
eGFR: 58 mL/min/{1.73_m2} — ABNORMAL LOW (ref >59)

## 2021-12-29 DIAGNOSIS — Z8546 Personal history of malignant neoplasm of prostate: Secondary | ICD-10-CM | POA: Diagnosis not present

## 2021-12-29 DIAGNOSIS — N3941 Urge incontinence: Secondary | ICD-10-CM | POA: Diagnosis not present

## 2022-01-01 ENCOUNTER — Ambulatory Visit (INDEPENDENT_AMBULATORY_CARE_PROVIDER_SITE_OTHER): Payer: PPO | Admitting: Family Medicine

## 2022-01-01 ENCOUNTER — Encounter: Payer: Self-pay | Admitting: Family Medicine

## 2022-01-01 VITALS — BP 114/68 | HR 85 | Temp 97.6°F | Ht 66.0 in | Wt 210.2 lb

## 2022-01-01 DIAGNOSIS — N183 Chronic kidney disease, stage 3 unspecified: Secondary | ICD-10-CM

## 2022-01-01 DIAGNOSIS — E785 Hyperlipidemia, unspecified: Secondary | ICD-10-CM

## 2022-01-01 DIAGNOSIS — F325 Major depressive disorder, single episode, in full remission: Secondary | ICD-10-CM | POA: Diagnosis not present

## 2022-01-01 DIAGNOSIS — R739 Hyperglycemia, unspecified: Secondary | ICD-10-CM

## 2022-01-01 DIAGNOSIS — I10 Essential (primary) hypertension: Secondary | ICD-10-CM

## 2022-01-01 DIAGNOSIS — I2510 Atherosclerotic heart disease of native coronary artery without angina pectoris: Secondary | ICD-10-CM | POA: Diagnosis not present

## 2022-01-01 NOTE — Progress Notes (Signed)
Phone [phone] In person visit   Subjective:   Joshua [name] is a 81 y.o. year old very pleasant male patient who presents for/with See problem oriented charting Chief Complaint  Patient presents with   Follow-up    Pt c/o of groin itch   Past Medical History-  Patient Active Problem List   Diagnosis Date Noted   Coronary atherosclerosis 10/12/2006    Priority: High   Irritable bowel syndrome 10/12/2006    Priority: High   Overactive bladder 11/24/2019    Priority: Medium    Hyperglycemia 10/07/2017    Priority: Medium    Gout 11/20/2016    Priority: Medium    CKD (chronic kidney disease), stage III ([place]) 10/23/2013    Priority: Medium    Neuropathy ([place]) 08/21/2013    Priority: Medium    History of bladder cancer 04/01/2010    Priority: Medium    Essential hypertension 02/21/2007    Priority: Medium    Hyperlipidemia 10/12/2006    Priority: Medium    Major depression in full remission ([place]) 10/12/2006    Priority: Medium    Asthma 10/12/2006    Priority: Medium    Aortic atherosclerosis ([place]) 05/05/2018    Priority: Low   DJD (degenerative joint disease)     Priority: Low   Arthritis     Priority: Low   Neck pain 02/08/2017    Priority: Low   Venous insufficiency 12/29/2016    Priority: Low   Ganglion cyst of tendon sheath of right hand 12/29/2016    Priority: Low   Avulsion fracture of metatarsal bone of right foot 11/25/2016    Priority: Low   Obesity, Class I, BMI 30-34.9 10/03/2015    Priority: Low   Allergic rhinitis 05/31/2014    Priority: Low   Fatigue 11/09/2013    Priority: Low   Dizziness 08/23/2013    Priority: Low   RLS (restless legs syndrome) 01/03/2013    Priority: Low   Chronic bilateral low back pain with left-sided sciatica 10/09/2011    Priority: Low   OSTEOARTHRITIS, LOWER LEG, LEFT 10/04/2009    Priority: Low   ANEMIA, IRON DEFICIENCY 11/06/2008    Priority: Low   TINNITUS, CHRONIC, 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    Priority:  Low   OSA (obstructive sleep apnea) 11/02/2006    Priority: Low   GERD 10/12/2006    Priority: Low   Greater trochanteric bursitis of right hip 02/17/2021   Hyperactive bowel sounds 01/29/2021   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 ([place]) 09/25/2020   Primary osteoarthritis of left knee 06/18/2020   Pre-operative respiratory examination 06/05/2020   Coronary artery disease involving coronary bypass graft of native heart without angina pectoris 04/07/2020   Trigger thumb, left thumb 01/24/2020   Left lateral epicondylitis 09/05/2019   Degenerative arthritis of left knee 08/08/2019   Educated about COVID-19 virus infection 04/20/2019   Unstable angina (HCC)    Greater trochanteric bursitis of left hip 10/27/2018   Degenerative disc disease, lumbar 10/27/2018   Abdominal muscle strain, initial encounter 06/16/2018   Weakness of left leg 06/21/2017   Hereditary and idiopathic peripheral neuropathy 09/25/2014   Generalized abdominal pain 01/31/2009    Medications- reviewed and updated Current Outpatient Medications  Medication Sig Dispense Refill   acetaminophen (TYLENOL) 500 MG tablet Take 1,000 mg by mouth every 6 (six) hours as needed for moderate pain or headache.     albuterol (PROAIR HFA) 108 (90 Base) MCG/ACT inhaler Inhale  1-2 puffs into the lungs every 6 (six) hours as needed for wheezing or shortness of breath. 18 g 5   Alirocumab (PRALUENT) 150 MG/ML SOAJ INJECT 1 DOSE INTO THE SKIN EVERY 14 (FOURTEEN) DAYS. 6 mL 3   amLODipine (NORVASC) 5 MG tablet TAKE 1 TABLET (5 MG TOTAL) BY MOUTH DAILY. 90 tablet 3   aspirin EC 81 MG tablet Take 1 tablet (81 mg total) by mouth 2 (two) times daily after a meal. 30 tablet 11   busPIRone (BUSPAR) 5 MG tablet Take 1 tablet (5 mg total) by mouth 2 (two) times daily as needed (anxiety). 40 tablet 2   cholecalciferol (VITAMIN D3) 25 MCG (1000 UNIT) tablet Take 1,000 Units by mouth daily.     clotrimazole-betamethasone  (LOTRISONE) cream Apply 1 application topically See admin instructions. Twice daily every 4 days     Cyanocobalamin (B-12) 2500 MCG TABS Take 1,250 mcg by mouth daily.     diazepam (VALIUM) 5 MG tablet TAKE 1 TABLET (5 MG TOTAL) BY MOUTH AT BEDTIME AS NEEDED (SLEEP). 30 tablet 1   EPINEPHrine 0.3 mg/0.3 mL IJ SOAJ injection Inject 0.3 mg into the muscle as needed for anaphylaxis. for allergic reaction     ezetimibe (ZETIA) 10 MG tablet TAKE 1 TABLET BY MOUTH EVERY DAY 90 tablet 3   FLOVENT HFA 110 MCG/ACT inhaler Inhale 1 puff into the lungs 2 (two) times daily.   5   fluticasone (FLONASE) 50 MCG/ACT nasal spray Place 1 spray into both nostrils 2 (two) times daily.      folic acid (FOLVITE) 811 MCG tablet Take 800 mcg by mouth daily.      gabapentin (NEURONTIN) 300 MG capsule Take 1 cap in AM, 1 cap at noon, 3 caps at bedtime 450 capsule 3   GEMTESA 75 MG TABS TAKE 1 TABLET BY MOUTH DAILY. 30 tablet 11   Glucos-Chond-Hyal Ac-Ca Fructo (MOVE FREE JOINT HEALTH ADVANCE PO) Take 1 tablet by mouth daily.     hydrochlorothiazide (HYDRODIURIL) 25 MG tablet TAKE 1 TABLET BY MOUTH EVERY DAY 90 tablet 2   ipratropium (ATROVENT HFA) 17 MCG/ACT inhaler Inhale 2 puffs into the lungs 3 (three) times daily. 1 each 1   ketoconazole (NIZORAL) 2 % cream Apply 1 application topically See admin instructions. Twice a day every other day     Miconazole Nitrate (LOTRIMIN AF DEODORANT POWDER) 2 % AERP Apply 1 spray topically daily as needed (jock itch).     nitroGLYCERIN (NITROSTAT) 0.4 MG SL tablet PLACE 1 TABLET UNDER THE TONGUE EVERY 5 (FIVE) MINUTES AS NEEDED. 75 tablet 1   pantoprazole (PROTONIX) 40 MG tablet TAKE 1 TABLET BY MOUTH EVERY DAY 90 tablet 3   polyethylene glycol powder (GLYCOLAX/MIRALAX) powder Take 17 g by mouth daily. 255 g 0   valsartan (DIOVAN) 320 MG tablet TAKE 1 TABLET BY MOUTH EVERY DAY 90 tablet 3   Wheat Dextrin (BENEFIBER) POWD Take 1 Dose by mouth daily. 1 dose = 2 teaspoons     No current  facility-administered medications for this visit.     Objective:  BP 114/68   Pulse 85   Temp 97.6 F (36.4 C)   Ht '5\' 6"'$  (1.676 m)   Wt 210 lb 3.2 oz (95.3 kg)   SpO2 95%   BMI 33.93 kg/m  Gen: NAD, resting comfortably CV: RRR no murmurs rubs or gallops Lungs: CTAB no crackles, wheeze, rhonchi Ext: trace edema Skin: warm, dry    Assessment and Plan   #  CAD/hyperlipidemia-history of CABG x5- follows with Dr. Percival Spanish S: Meds: asa '81mg'$ , praluent and ezetimibe 10 mg -Fatigue on Repatha in 2021-restarted on simvastatin 20 mg with plan for Zetia 10 mg p.o. as well but later placed back on praluent -Stent drug eluting placed 04/13/19 Dr. Angelena Form  -Patient did have some chest discomfort and cough but there was some concern could be related to starting Cymbalta-saw cardiology on 12/23/2021 and they recommended stopping Cymbalta, checking CBC BMP and checking chest x-ray -Cymbalta had previously been reduced from 30 to 20 mg by Dr. Georgina Snell due to reports of fatigue and abdominal discomfort even though it helped his knee pain -chest pain and abdominal pain resolved off of this  Currently no chest pain or shortness of breath Lab Results  Component Value Date   CHOL 129 05/13/2021   HDL 51.60 05/13/2021   LDLCALC 48 05/13/2021   LDLDIRECT 115.0 06/04/2016   TRIG 149.0 05/13/2021   CHOLHDL 3 05/13/2021  A/P: CAD asymptomatic- continue current meds  -lipids very well controlled- continue current meds   #Hypertension/CKD stage III S: Compliant with amlodipine 5 mg, hydrochlorothiazide 25 mg and valsartan '320mg'$  -GFR has been stable.  On ARB in case proteinuric element BP Readings from Last 3 Encounters:  01/01/22 114/68  12/23/21 130/62  12/05/21 126/81  A/P: Controlled. Continue current medications.  -kidney function stable- continue current meds . GFR at 58 just 10 days ago   #Depression, major, single episode-full remission- phq9 of 0. Actually feels better off cymbalta.    #  Hyperglycemia/insulin resistance/prediabetes S:  Medication: None -he has inquired about Ozempic in the past but we have been concerned about his IBS history as well as fact Brother with type 1 diabetes ended up in the hospital after taking Ozempic. -I did congratulate him on 4 pounds weight loss from last visit on our scales Lab Results  Component Value Date   HGBA1C 5.7 05/13/2021   HGBA1C 5.7 02/06/2021   HGBA1C 6.2 (H) 06/11/2020   A/P: Well-controlled on most recent check but time for repeat - we will do this next visit when due for other labs   #Incontinence/overactive bladder- in patient with history of prostate/bladder cancer fully treated- doing better on gemtessa- actually did better on cymbalta- but improvement remained when coming back off- mild worsening  #Knee pain- doing some videos on youtube that have helped his knee through PT  Recommended follow up: Return in about 3 months (around 04/03/2022) for followup or sooner if needed.Schedule b4 you leave. Also sche[place] 6 months physical Future Appointments  Date Time Provider [place]  01/12/2022  9:00 AM Lyndal Pulley, DO [facility] None  01/23/2022  9:20 AM [name], MD [facility] None  04/27/2022 10:00 AM Cameron Sprang, MD [facility] None  06/04/2022  8:45 AM [facility] HEALTH COACH [facility]    Lab/Order associations:   ICD-10-CM   1. Atherosclerosis of native coronary artery of native heart without angina pectoris  I25.10     2. Major depressive disorder with single episode, in full remission ([place])  F32.5     3. Hyperlipidemia, unspecified hyperlipidemia type  E78.5     4. Hyperglycemia  R73.9     5. Essential hypertension  I10     6. Stage 3 chronic kidney disease, unspecified whether stage 3a or 3b CKD ([place])  N18.30       No orders of the defined types were placed in this encounter.   Return precautions advised.  [name], MD

## 2022-01-01 NOTE — Patient Instructions (Addendum)
Update [place] when you get covid shot at pharmacy  Glad you are doing better!   Recommended follow up: Return in about 3 months (around 04/03/2022) for followup or sooner if needed.Schedule b4 you leave. - also schedule 6 month physical

## 2022-01-07 DIAGNOSIS — J3081 Allergic rhinitis due to animal (cat) (dog) hair and dander: Secondary | ICD-10-CM | POA: Diagnosis not present

## 2022-01-07 DIAGNOSIS — J301 Allergic rhinitis due to pollen: Secondary | ICD-10-CM | POA: Diagnosis not present

## 2022-01-07 DIAGNOSIS — J3089 Other allergic rhinitis: Secondary | ICD-10-CM | POA: Diagnosis not present

## 2022-01-08 DIAGNOSIS — H524 Presbyopia: Secondary | ICD-10-CM | POA: Diagnosis not present

## 2022-01-08 DIAGNOSIS — H2513 Age-related nuclear cataract, bilateral: Secondary | ICD-10-CM | POA: Diagnosis not present

## 2022-01-08 DIAGNOSIS — H25013 Cortical age-related cataract, bilateral: Secondary | ICD-10-CM | POA: Diagnosis not present

## 2022-01-09 NOTE — Progress Notes (Unsigned)
[facility] Phone: [phone] Subjective:   Joshua [name], am serving as a scribe for Dr. Hulan Saas.  I'm seeing this patient by the request  of:  [name], MD  CC: Hip pain, neck pain    [id number]  11/17/2021 Continues to have peripheral neuropathy and significant amount of pain.  I do think there is some underlying osteo arthritic changes noted as well.  Patient is at this point would like to try Cymbalta.  Viewed patient's chart and did try it many years ago.  Patient is looking for anything to help him with some of his pain.  Warned of potential side effects.  Hopefully patient does not have any of them at this time.  Knows that if it does to discontinue immediately.  We will have patient try to titrate a little off of the gabapentin as well with patient's kidney function being borderline.  We will follow-up with me again in 2 months  Significant improvement with the PRP.  Likely now that this does work relatively well.  Can repeat if necessary.  Update 01/12/2022 Joshua [name] is a 81 y.o. male coming in with complaint of neuropathy and R hip pain. Patient states that R hip is not doing well. Pain is worse than it was last visit. Pain radiating into R thigh. Cannot get comfortable since last week. L knee pain has also increased.   Neck pain increased over weekend. Notes having surgery in 2000s and had plate put in neck. Pain more on the right side than the left.      Past Medical History:  Diagnosis Date   Acute medial meniscal tear    Anemia 2015   Arthritis    "middle finger right hand; right knee; neck" (02/06/2014)   Asthma    seasonal   Bladder cancer ([place]) 04/2010   "cauterized during prostate OR"   CAD (coronary artery disease)    CABG 2001   Diverticulosis    DJD (degenerative joint disease)    BACK   GERD (gastroesophageal reflux disease)    H/O inguinal hernia repair  12/2018   History of gout    Hyperlipidemia    Hypertension    IBS (irritable bowel syndrome)    Obesity    OSA (obstructive sleep apnea)    USES CPAP    Pancreatitis ~ 1980   Peripheral vascular disease ([place])    Pre-diabetes    Prostate cancer ([place]) 04/2010   Past Surgical History:  Procedure Laterality Date   ANTERIOR CERVICAL DECOMP/DISCECTOMY FUSION  05/26/2006   PLATE PLACED   BACK SURGERY     CARDIAC CATHETERIZATION  11/20/1999   CORONARY ARTERY BYPASS GRAFT  11/20/1999   SVG-RI1-RI2, SVG-OM, SVG-dRCA   CORONARY STENT INTERVENTION N/A 04/13/2019   Procedure: CORONARY STENT INTERVENTION;  Surgeon: Burnell Blanks, MD;  Location: [facility];  Service: Cardiovascular;  Laterality: N/A;   HERNIA REPAIR     INGUINAL HERNIA REPAIR Left 01/02/2019   Procedure: OPEN LEFT INGUINAL HERNIA REPAIR WITH MESH;  Surgeon: Johnathan Hausen, MD;  Location: [facility];  Service: General;  Laterality: Left;   KNEE ARTHROSCOPY Right 1992; 04/2008   LAPAROSCOPIC CHOLECYSTECTOMY  2007   LEFT HEART CATH AND CORS/GRAFTS ANGIOGRAPHY N/A 04/13/2019   Procedure: LEFT HEART CATH AND CORS/GRAFTS ANGIOGRAPHY;  Surgeon: Burnell Blanks, MD;  Location: [facility];  Service: Cardiovascular;  Laterality: N/A;   NASAL  SEPTUM SURGERY Right 2007   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   ROBOT ASSISTED LAPAROSCOPIC RADICAL PROSTATECTOMY  04/2010   THYROIDECTOMY, PARTIAL  1980   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   Procedure: LEFT TOTAL KNEE ARTHROPLASTY;  Surgeon: Melrose Nakayama, MD;  Location: [facility];  Service: Orthopedics;  Laterality: Left;   Social History   Socioeconomic History   Marital status: Married    Spouse name: Not on file   Number of children: Not on file   Years of education: Not on file   Highest education level: Not on file  Occupational History   Occupation: retired    Fish farm manager: RETIRED  Tobacco Use   Smoking status: Former    Packs/day: 1.00    Years:  35.00    Total pack years: 35.00    Types: Cigarettes    Quit date: 06/16/1992    Years since quitting: 29.5   Smokeless tobacco: Never  Vaping Use   Vaping Use: Never used  Substance and Sexual Activity   Alcohol use: Yes    Alcohol/week: 2.0 standard drinks of alcohol    Types: 2 Shots of liquor per week    Comment: occ   Drug use: [name]   Sexual activity: Not Currently    Partners: Female  Other Topics Concern   Not on file  Social History Narrative   Married 42 years in 2015. 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      Hobbies: poker, movies and tv, staying active      Right handed   2 level home with steps he uses   Social Determinants of Radio broadcast assistant Strain: Low Risk  (05/22/2021)   Overall Financial Resource Strain (CARDIA)    Difficulty of Paying Living Expenses: Not hard at all  Food Insecurity: [name] Food Insecurity (05/22/2021)   Hunger Vital Sign    Worried About Running Out of Food in the Last Year: Never true    [place] in the Last Year: Never true  Transportation Needs: [name] Transportation Needs (05/22/2021)   PRAPARE - Hydrologist (Medical): [name]    Lack of Transportation (Non-Medical): [name]  Physical Activity: Inactive (05/22/2021)   Exercise Vital Sign    Days of Exercise per Week: 0 days    Minutes of Exercise per Session: 0 min  Stress: [name] Stress Concern Present (05/22/2021)   [place]    Feeling of Stress : Only a little  Social Connections: Moderately Integrated (05/22/2021)   Social Connection and Isolation Panel [NHANES]    Frequency of Communication with Friends and Family: More than three times a week    Frequency of Social Gatherings with Friends and Family: More than three times a week    Attends Religious Services: Never    Marine scientist or Organizations: Yes     Attends Archivist Meetings: 1 to 4 times per year    Marital Status: Married   Allergies  Allergen Reactions   Benadryl [Diphenhydramine] Other (See Comments)    Pt told not to take because of bypass surgery   Bromfed Nausea And Vomiting   Clarithromycin Other (See Comments)     gastritis   Codeine Other (See Comments)    Trouble breathing   Cymbalta [Duloxetine Hcl]  Abdominal pain and later chest pain as well as worsening fatigue    Doxycycline Hyclate Nausea And Vomiting   Lexapro [Escitalopram]     Dizziness   Modafinil Other (See Comments)     anxiety-nervousness   Oxycodone-Acetaminophen Itching   Promethazine Hcl Other (See Comments)     fainting   Quinolones Nausea Only    Cipro "felt real bad"   Telithromycin Nausea And Vomiting   Tramadol Nausea Only   Hydrocodone-Acetaminophen Rash   Statins Other (See Comments)    Leg cramps and makes patient feel bad. Per pt tried multiple in years past   Sulfonamide Derivatives Rash   Family History  Problem Relation Age of Onset   Heart disease Mother    Hyperlipidemia Mother    Heart disease Father    Hyperlipidemia Father    Diabetes Brother    Colon cancer Neg Hx    Pancreatic cancer Neg Hx    Esophageal cancer Neg Hx    Stomach cancer Neg Hx    Liver cancer Neg Hx      Current Outpatient Medications (Cardiovascular):    Alirocumab (PRALUENT) 150 MG/ML SOAJ, INJECT 1 DOSE INTO THE SKIN EVERY 14 (FOURTEEN) DAYS.   amLODipine (NORVASC) 5 MG tablet, TAKE 1 TABLET (5 MG TOTAL) BY MOUTH DAILY.   EPINEPHrine 0.3 mg/0.3 mL IJ SOAJ injection, Inject 0.3 mg into the muscle as needed for anaphylaxis. for allergic reaction   ezetimibe (ZETIA) 10 MG tablet, TAKE 1 TABLET BY MOUTH EVERY DAY   hydrochlorothiazide (HYDRODIURIL) 25 MG tablet, TAKE 1 TABLET BY MOUTH EVERY DAY   nitroGLYCERIN (NITROSTAT) 0.4 MG SL tablet, PLACE 1 TABLET UNDER THE TONGUE EVERY 5 (FIVE) MINUTES AS NEEDED.   valsartan (DIOVAN) 320 MG  tablet, TAKE 1 TABLET BY MOUTH EVERY DAY  Current Outpatient Medications (Respiratory):    albuterol (PROAIR HFA) 108 (90 Base) MCG/ACT inhaler, Inhale 1-2 puffs into the lungs every 6 (six) hours as needed for wheezing or shortness of breath.   FLOVENT HFA 110 MCG/ACT inhaler, Inhale 1 puff into the lungs 2 (two) times daily.    fluticasone (FLONASE) 50 MCG/ACT nasal spray, Place 1 spray into both nostrils 2 (two) times daily.    ipratropium (ATROVENT HFA) 17 MCG/ACT inhaler, Inhale 2 puffs into the lungs 3 (three) times daily.  Current Outpatient Medications (Analgesics):    acetaminophen (TYLENOL) 500 MG tablet, Take 1,000 mg by mouth every 6 (six) hours as needed for moderate pain or headache.   aspirin EC 81 MG tablet, Take 1 tablet (81 mg total) by mouth 2 (two) times daily after a meal.  Current Outpatient Medications (Hematological):    Cyanocobalamin (B-12) 2500 MCG TABS, Take 1,250 mcg by mouth daily.   folic acid (FOLVITE) 680 MCG tablet, Take 800 mcg by mouth daily.   Current Outpatient Medications (Other):    busPIRone (BUSPAR) 5 MG tablet, Take 1 tablet (5 mg total) by mouth 2 (two) times daily as needed (anxiety).   cholecalciferol (VITAMIN D3) 25 MCG (1000 UNIT) tablet, Take 1,000 Units by mouth daily.   clotrimazole-betamethasone (LOTRISONE) cream, Apply 1 application topically See admin instructions. Twice daily every 4 days   diazepam (VALIUM) 5 MG tablet, TAKE 1 TABLET (5 MG TOTAL) BY MOUTH AT BEDTIME AS NEEDED (SLEEP).   gabapentin (NEURONTIN) 300 MG capsule, Take 1 cap in AM, 1 cap at noon, 3 caps at bedtime   GEMTESA 75 MG TABS, TAKE 1 TABLET BY MOUTH DAILY.   Glucos-Chond-Hyal Ac-Ca  Fructo (MOVE FREE JOINT HEALTH ADVANCE PO), Take 1 tablet by mouth daily.   ketoconazole (NIZORAL) 2 % cream, Apply 1 application topically See admin instructions. Twice a day every other day   Miconazole Nitrate (LOTRIMIN AF DEODORANT POWDER) 2 % AERP, Apply 1 spray topically daily as  needed (jock itch).   pantoprazole (PROTONIX) 40 MG tablet, TAKE 1 TABLET BY MOUTH EVERY DAY   polyethylene glycol powder (GLYCOLAX/MIRALAX) powder, Take 17 g by mouth daily.   Wheat Dextrin (BENEFIBER) POWD, Take 1 Dose by mouth daily. 1 dose = 2 teaspoons   Reviewed prior external information including notes and imaging from  primary care provider As well as notes that were available from care everywhere and other healthcare systems.  Past medical history, social, surgical and family history all reviewed in electronic medical record.  [name] pertanent information unless stated regarding to the chief complaint.   Review of Systems:  [name] headache, visual changes, nausea, vomiting, diarrhea, constipation, dizziness, abdominal pain, skin rash, fevers, chills, night sweats, weight loss, swollen lymph nodes, body aches, joint swelling, chest pain, shortness of breath, mood changes. POSITIVE muscle aches  Objective  Blood pressure 106/72, pulse 70, height '5\' 6"'$  (1.676 m), weight 213 lb (96.6 kg), SpO2 97 %.   General: [name] apparent distress alert and oriented x3 mood and affect normal, dressed appropriately.  HEENT: Pupils equal, extraocular movements intact  Respiratory: Patient's speak in full sentences and does not appear short of breath  Cardiovascular: [name] lower extremity edema, non tender, [name] erythema  Right hip exam does have some positive impingement noted.  Patient does have severe tenderness to palpation over the greater trochanteric area.  Patient does have tightness with straight leg test bilaterally though.  Only 5 degrees of extension of the back noted.  Neck exam does have some crepitus noted.  Lacks last 10 degrees of extension of the last 10 degrees of flexion.  Negative Spurling's though today.  Neurovascular intact of the hands bilaterally.   After verbal consent patient was prepped with alcohol swab and with a 21-gauge 2 inch needle injected into the right greater trochanteric area  with 2 cc of 0.5% Marcaine and 1 cc of Kenalog 40 mg/mL.  [name] blood loss.  Band-Aid placed.  Postinjection instructions given    Impression and Recommendations:    The above documentation has been reviewed and is accurate and complete Lyndal Pulley, DO

## 2022-01-12 ENCOUNTER — Ambulatory Visit: Payer: PPO | Admitting: Family Medicine

## 2022-01-12 ENCOUNTER — Ambulatory Visit (INDEPENDENT_AMBULATORY_CARE_PROVIDER_SITE_OTHER): Payer: PPO

## 2022-01-12 ENCOUNTER — Encounter: Payer: Self-pay | Admitting: Family Medicine

## 2022-01-12 VITALS — BP 106/72 | HR 70 | Ht 66.0 in | Wt 213.0 lb

## 2022-01-12 DIAGNOSIS — M7061 Trochanteric bursitis, right hip: Secondary | ICD-10-CM

## 2022-01-12 DIAGNOSIS — E538 Deficiency of other specified B group vitamins: Secondary | ICD-10-CM

## 2022-01-12 DIAGNOSIS — M51369 Other intervertebral disc degeneration, lumbar region without mention of lumbar back pain or lower extremity pain: Secondary | ICD-10-CM

## 2022-01-12 DIAGNOSIS — M542 Cervicalgia: Secondary | ICD-10-CM | POA: Diagnosis not present

## 2022-01-12 DIAGNOSIS — M5030 Other cervical disc degeneration, unspecified cervical region: Secondary | ICD-10-CM | POA: Diagnosis not present

## 2022-01-12 DIAGNOSIS — R29898 Other symptoms and signs involving the musculoskeletal system: Secondary | ICD-10-CM | POA: Diagnosis not present

## 2022-01-12 DIAGNOSIS — M47812 Spondylosis without myelopathy or radiculopathy, cervical region: Secondary | ICD-10-CM | POA: Diagnosis not present

## 2022-01-12 DIAGNOSIS — M5136 Other intervertebral disc degeneration, lumbar region: Secondary | ICD-10-CM | POA: Diagnosis not present

## 2022-01-12 MED ORDER — CYANOCOBALAMIN 1000 MCG/ML IJ SOLN
1000.0000 ug | Freq: Once | INTRAMUSCULAR | Status: AC
  Administered 2022-01-12: 1000 ug via INTRAMUSCULAR

## 2022-01-12 MED ORDER — KETOROLAC TROMETHAMINE 60 MG/2ML IM SOLN
60.0000 mg | Freq: Once | INTRAMUSCULAR | Status: AC
  Administered 2022-01-12: 60 mg via INTRAMUSCULAR

## 2022-01-12 NOTE — Assessment & Plan Note (Signed)
Patient given a Toradol injection today.  Home the side.  Hopefully will be beneficial.  Wanted him to avoid any type of steroids secondary to patient side effects at the moment.  Discussed with patient about posture and ergonomics.  Discussed hip abductor strengthening.  Discussed differential includes possible lumbar radiculopathy.  Total time with patient as well as reviewing patient's imaging and records including other providers notes 33 minutes.

## 2022-01-12 NOTE — Patient Instructions (Signed)
PT Joshua [name] for neck B12 injection today Injected hip See me again in 2 months

## 2022-01-12 NOTE — Assessment & Plan Note (Signed)
Could be within the differential.  Will consider the possibility of further evaluation or epidurals if necessary.

## 2022-01-12 NOTE — Assessment & Plan Note (Signed)
History of a two-level fusion noted.  There does appear to be stable overall..  We will start with formal physical therapy that I think will help some of the neck pain work on posture and ergonomics and wounds.  Multiple different allergies so we will not start any medications at this time.  Worsening pain effusion as well as see if he would be candidate for possible orals.  Social determinants of health this will be difficult for surgical intervention secondary other comorbidities.

## 2022-01-12 NOTE — Assessment & Plan Note (Signed)
Attempted B12 injection to see if this will help.

## 2022-01-13 ENCOUNTER — Ambulatory Visit: Payer: PPO | Admitting: Family Medicine

## 2022-01-13 ENCOUNTER — Telehealth: Payer: Self-pay | Admitting: Internal Medicine

## 2022-01-13 NOTE — Telephone Encounter (Signed)
Patient re-enrolled for [employer] grant. Approved until 12/25/2022 Patient notified of this - MyChart message sent and letter mailed  12/24/2021 - 12/25/2022  CARD NO. [id number]   CARD STATUS Active   BIN [id number]   PCN [id number]   PC GROUP [id number]   PROVIDER [id number]   PROCESSOR PDMI

## 2022-01-13 NOTE — Telephone Encounter (Signed)
Pt c/o medication issue:  1. Name of Medication: Alirocumab (PRALUENT) 150 MG/ML SOAJ  2. How are you currently taking this medication (dosage and times per day)? Inject every 14 days  3. Are you having a reaction (difficulty breathing--STAT)? no  4. What is your medication issue? Patient states he has a foundation card to get the medication for free, but it has run out.

## 2022-01-19 ENCOUNTER — Encounter: Payer: Self-pay | Admitting: Physical Therapy

## 2022-01-19 ENCOUNTER — Ambulatory Visit: Payer: PPO | Admitting: Physical Therapy

## 2022-01-19 ENCOUNTER — Ambulatory Visit (INDEPENDENT_AMBULATORY_CARE_PROVIDER_SITE_OTHER): Payer: PPO | Admitting: Physical Therapy

## 2022-01-19 DIAGNOSIS — M25551 Pain in right hip: Secondary | ICD-10-CM

## 2022-01-19 DIAGNOSIS — M25562 Pain in left knee: Secondary | ICD-10-CM | POA: Diagnosis not present

## 2022-01-19 DIAGNOSIS — M25561 Pain in right knee: Secondary | ICD-10-CM

## 2022-01-19 DIAGNOSIS — G8929 Other chronic pain: Secondary | ICD-10-CM

## 2022-01-19 DIAGNOSIS — M542 Cervicalgia: Secondary | ICD-10-CM

## 2022-01-19 DIAGNOSIS — M5459 Other low back pain: Secondary | ICD-10-CM | POA: Diagnosis not present

## 2022-01-19 NOTE — Therapy (Signed)
OUTPATIENT PHYSICAL THERAPY EVALUATION   Patient Name: Joshua [name] MRN: [id number] DOB:1940/06/08, 81 y.o., male Today's Date: 01/19/2022   PT End of Session - 01/19/22 0805     Visit Number 1    Number of Visits 16    Date for PT Re-Evaluation 03/16/22    Authorization Type HTA    5 prior visits this year for PT    Authorization - Visit Number --    Authorization - Number of Visits --    Progress Note Due on Visit --    PT Start Time 0804    PT Stop Time 0845    PT Time Calculation (min) 41 min    Activity Tolerance Patient tolerated treatment well    Behavior During Therapy [facility] for tasks assessed/performed             Past Medical History:  Diagnosis Date   Acute medial meniscal tear    Anemia 2015   Arthritis    "middle finger right hand; right knee; neck" (02/06/2014)   Asthma    seasonal   Bladder cancer ([place]) 04/2010   "cauterized during prostate OR"   CAD (coronary artery disease)    CABG 2001   Diverticulosis    DJD (degenerative joint disease)    BACK   GERD (gastroesophageal reflux disease)    H/O inguinal hernia repair 12/2018   History of gout    Hyperlipidemia    Hypertension    IBS (irritable bowel syndrome)    Obesity    OSA (obstructive sleep apnea)    USES CPAP    Pancreatitis ~ 1980   Peripheral vascular disease ([place])    Pre-diabetes    Prostate cancer ([place]) 04/2010   Past Surgical History:  Procedure Laterality Date   ANTERIOR CERVICAL DECOMP/DISCECTOMY FUSION  05/26/2006   PLATE PLACED   BACK SURGERY     CARDIAC CATHETERIZATION  11/20/1999   CORONARY ARTERY BYPASS GRAFT  11/20/1999   SVG-RI1-RI2, SVG-OM, SVG-dRCA   CORONARY STENT INTERVENTION N/A 04/13/2019   Procedure: CORONARY STENT INTERVENTION;  Surgeon: Burnell Blanks, MD;  Location: [facility];  Service: Cardiovascular;  Laterality: N/A;   HERNIA REPAIR     INGUINAL HERNIA REPAIR Left 01/02/2019   Procedure: OPEN LEFT INGUINAL HERNIA REPAIR WITH  MESH;  Surgeon: Johnathan Hausen, MD;  Location: [facility];  Service: General;  Laterality: Left;   KNEE ARTHROSCOPY Right 1992; 04/2008   LAPAROSCOPIC CHOLECYSTECTOMY  2007   LEFT HEART CATH AND CORS/GRAFTS ANGIOGRAPHY N/A 04/13/2019   Procedure: LEFT HEART CATH AND CORS/GRAFTS ANGIOGRAPHY;  Surgeon: Burnell Blanks, MD;  Location: [facility] [facility];  Service: Cardiovascular;  Laterality: N/A;   NASAL SEPTUM SURGERY Right 2007   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   ROBOT ASSISTED LAPAROSCOPIC RADICAL PROSTATECTOMY  04/2010   THYROIDECTOMY, PARTIAL  1980   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   Procedure: LEFT TOTAL KNEE ARTHROPLASTY;  Surgeon: Melrose Nakayama, MD;  Location: [facility];  Service: Orthopedics;  Laterality: Left;   Patient Active Problem List   Diagnosis Date Noted   Degenerative cervical disc 01/12/2022   Greater trochanteric bursitis of right hip 02/17/2021   Hyperactive bowel sounds 01/29/2021   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 ([place]) 09/25/2020   Primary osteoarthritis of left knee 06/18/2020   Pre-operative respiratory examination 06/05/2020   Coronary artery disease involving coronary bypass graft of native heart without angina pectoris 04/07/2020   Trigger thumb,  left thumb 01/24/2020   Overactive bladder 11/24/2019   Left lateral epicondylitis 09/05/2019   Degenerative arthritis of left knee 08/08/2019   Educated about COVID-19 virus infection 04/20/2019   Unstable angina (HCC)    Greater trochanteric bursitis of left hip 10/27/2018   Degenerative disc disease, lumbar 10/27/2018   Abdominal muscle strain, initial encounter 06/16/2018   Aortic atherosclerosis ([place]) 05/05/2018   Hyperglycemia 10/07/2017   Weakness of left leg 06/21/2017   DJD (degenerative joint disease)    Arthritis    Neck pain 02/08/2017   Venous insufficiency 12/29/2016   Ganglion cyst of tendon sheath of right hand 12/29/2016   Avulsion fracture of  metatarsal bone of right foot 11/25/2016   Gout 11/20/2016   Obesity, Class I, BMI 30-34.9 10/03/2015   Hereditary and idiopathic peripheral neuropathy 09/25/2014   Allergic rhinitis 05/31/2014   Fatigue 11/09/2013   CKD (chronic kidney disease), stage III ([place]) 10/23/2013   Dizziness 08/23/2013   Neuropathy ([place]) 08/21/2013   RLS (restless legs syndrome) 01/03/2013   Chronic bilateral low back pain with left-sided sciatica 10/09/2011   History of bladder cancer 04/01/2010   OSTEOARTHRITIS, LOWER LEG, LEFT 10/04/2009   Generalized abdominal pain 01/31/2009   ANEMIA, IRON DEFICIENCY 11/06/2008   TINNITUS, CHRONIC, 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   Essential hypertension 02/21/2007   OSA (obstructive sleep apnea) 11/02/2006   Hyperlipidemia 10/12/2006   Major depression in full remission ([place]) 10/12/2006   Coronary atherosclerosis 10/12/2006   Asthma 10/12/2006   GERD 10/12/2006   Irritable bowel syndrome 10/12/2006    PCP: [name]   REFERRING PROVIDER: Charlann Boxer  REFERRING DIAG: Neck pain, R hip pain   THERAPY DIAG:  Cervicalgia  Other low back pain  Pain in right hip  Chronic pain of right knee  Chronic pain of left knee  Rationale for Evaluation and Treatment Rehabilitation  ONSET DATE:   SUBJECTIVE:                                                                                                                                                                                      SUBJECTIVE STATEMENT: Pt states neck pain that started about 1 month ago, no injury to report. Most pain in High cervical region on R,  Pain comes and goes , pain with L rotation. Headaches at times from neck pain- evening.  Also has pain in :  R hip pain,  R knee-  gives way,  Had L knee replaced- painful all the time.  Back pain bilateral L>R.   Neuropathy- taking gabapentin. - very painful- feels like needles.  Exercise: has bike at clubhouse(not using), likes to walk but now too  painful.   Pt has had previous injections:  L hip 08/2021,  06/2021 L4/5    PERTINENT HISTORY: PVD, CAD, CABG, Neuropathy, CKD, cervical fusion, L knee surgery,    PAIN:  Are you having pain? Yes: NPRS scale: 4/10 Pain location: Neck  Pain description: sore  Aggravating factors: L rotation, fleixon  Relieving factors: rest  Are you having pain? Yes: NPRS scale: 5/10 Pain location: R knee Pain description: pain, weak Aggravating factors: increased activity  Relieving factors: rest  Are you having pain? Yes: NPRS scale: 2/10 Pain location: R hip Pain description: pain Aggravating factors: increased activity  Relieving factors: rest   Are you having pain? Yes: NPRS scale: 4 /10 Pain location: Back Pain description: sore, achey Aggravating factors: Standing/walking  Relieving factors: sitting/rest    PRECAUTIONS: None  WEIGHT BEARING RESTRICTIONS: No  FALLS:  Has patient fallen in last 6 months? No   PLOF: Independent  PATIENT GOALS: Decreased pain in neck, hip, back   OBJECTIVE:   DIAGNOSTIC FINDINGS:  Recent neck xray:  IMPRESSION: C5 through C7 anterior fusion again noted. Hardware intact. Diffuse degenerative change. No acute bony abnormality.   Lumbar MRI:  IMPRESSION: 1. 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 Bilateral neural foraminal narrowing at L4-L5 and L5-S1 secondary to advanced facet joint arthropathy.   2. 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   3.  Grade 1 anterolisthesis of L4, unchanged.   4. 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 Follow-up with annual abdominal sonogram is recommended.  R hip: 01/2021:  Mild joint space loss noted at the right hip. Advanced degenerative changes partially visualized in  the lower lumbar spine.  L knee: IMPRESSION: Uncomplicated left total knee prosthesis.    COGNITION: Overall cognitive status: Within functional limits for tasks assessed     POSTURE: rounded shoulders, forward head   UPPER EXTREMITY ROM:   Cervical: L rotation: 55,   R rotation: 65 , flex: mild limitation/pain,  ext: mod/sig limitation:   Shoulder: mild limitation for R>L shoulder flex, IR behind back  Knees:  painful with movement   , L: 0- 110   R: 0- 115  Hips: mild limitation for rotation bil,      UPPER EXTREMITY MMT:  Shoulders: 4+/5 ,   Knees: 4+/5,   Hips: 4/5    JOINT MOBILITY TESTING:  Hypomobile c-spine    PALPATION:  Tenderness at bil SO, R low occiput, mild tenderness in bil paraspinals,  Tightness in R UT  Tenderness/tightness in R glute musculature, mild in gr troch.   Tenderness in L lumbar/thoracic paraspinals, and central l-spine      TODAY'S TREATMENT:  DATE:   Ther ex:  shoulder rolls x 10, scap squeezes x 10;     PATIENT EDUCATION: Education details: PT POC, Exam findings, HEP Person educated: Patient Education method: Explanation, Demonstration, Tactile cues, Verbal cues, and Handouts Education comprehension: verbalized understanding, returned demonstration, verbal cues required, tactile cues required, and needs further education    HOME EXERCISE PROGRAM: Access Code: [email or web address] URL: [email or web address] Date: 01/19/2022 Prepared by: Lyndee Hensen  Exercises - Standing Scapular Retraction  - 2 x daily - 1 sets - 10 reps - Standing Backward Shoulder Rolls  - 2 x daily - 1 sets - 10 reps - Correct Standing Posture     ASSESSMENT:  CLINICAL IMPRESSION: Patient presents with primary complaint of increased pain in cervical region. He has soreness in R high cervical  region, increased with activity and ROM. He has decreased ROM and poor postural awareness. Pt also has several other areas of pain. R hip/glute with increased pain and muscle tension. He has pain in bil knees, and low back. He also has neuropathy that effects balance. Gait/balance to be further assessed in future. Pt to benefit from skilled PT to improve deficits and pain.    OBJECTIVE IMPAIRMENTS: decreased activity tolerance, decreased balance, decreased knowledge of use of DME, decreased mobility, difficulty walking, decreased ROM, decreased strength, increased muscle spasms, impaired flexibility, improper body mechanics, and pain.   ACTIVITY LIMITATIONS: lifting, bending, standing, squatting, stairs, and locomotion level  PARTICIPATION LIMITATIONS: cleaning, driving, shopping, and community activity  PERSONAL FACTORS: 1-2 comorbidities: multiple pain locations, OA,   are also affecting patient's functional outcome.   REHAB POTENTIAL: Good  CLINICAL DECISION MAKING: Evolving/moderate complexity  EVALUATION COMPLEXITY: Moderate  GOALS: Goals reviewed with patient? Yes  SHORT TERM GOALS: Target date: 02/02/2022    Pt to be independent with HEP  Goal status: INITIAL  2.  Pt to report decreased pain in Neck to 0-3/10    Goal status: INITIAL    LONG TERM GOALS: Target date: 03/16/2022     1.Pt to be independent with final HEP, for neck, knees and back pain   Goal status: INITIAL  2.  Pt to demo ability to self correct posture at least 50 % of the time in clinic.    Goal status: INITIAL  3.  Pt to demo ability for full ROM for c-spine, without pain greater than 2/10, to improve ability for driving and ADLs.   Goal status: INITIAL  4.  Pt to report decreased pain in R hip/glute to 0-3/10 with activity, to improve ability for IADLs.   Goal status: INITIAL    PLAN: PT FREQUENCY: 2x/week  PT DURATION: 8 weeks  PLANNED INTERVENTIONS: Therapeutic exercises, Therapeutic  activity, Neuromuscular re-education, Balance training, Gait training, Patient/Family education, Self Care, Joint mobilization, Joint manipulation, Stair training, Orthotic/Fit training, DME instructions, Dry Needling, Electrical stimulation, Spinal manipulation, Spinal mobilization, Cryotherapy, Moist heat, Vasopneumatic device, Traction, Ultrasound, Ionotophoresis '4mg'$ /ml Dexamethasone, and Manual therapy  PLAN FOR NEXT SESSION:   manual for neck, R side muscle tension relief,  R glute muscles,  HEP for back, hip, knees when able.    Lyndee Hensen, PT, DPT 9:29 AM  01/19/22

## 2022-01-21 ENCOUNTER — Ambulatory Visit (INDEPENDENT_AMBULATORY_CARE_PROVIDER_SITE_OTHER): Payer: PPO | Admitting: Internal Medicine

## 2022-01-21 ENCOUNTER — Encounter: Payer: Self-pay | Admitting: Internal Medicine

## 2022-01-21 VITALS — BP 124/71 | HR 74 | Temp 97.3°F | Resp 14 | Ht 66.0 in | Wt 212.8 lb

## 2022-01-21 DIAGNOSIS — J3089 Other allergic rhinitis: Secondary | ICD-10-CM | POA: Diagnosis not present

## 2022-01-21 DIAGNOSIS — J3081 Allergic rhinitis due to animal (cat) (dog) hair and dander: Secondary | ICD-10-CM | POA: Diagnosis not present

## 2022-01-21 DIAGNOSIS — J301 Allergic rhinitis due to pollen: Secondary | ICD-10-CM | POA: Diagnosis not present

## 2022-01-21 DIAGNOSIS — L282 Other prurigo: Secondary | ICD-10-CM

## 2022-01-21 MED ORDER — PERMETHRIN 5 % EX CREA: 1.0000 | TOPICAL_CREAM | Freq: Once | CUTANEOUS | 0 refills | Status: AC

## 2022-01-21 MED ORDER — CLOBETASOL PROPIONATE 0.05 % EX CREA: 1.0000 | TOPICAL_CREAM | Freq: Two times a day (BID) | CUTANEOUS | 0 refills | Status: DC

## 2022-01-21 NOTE — Progress Notes (Signed)
[place] at [employer]:  [phone]   Routine Medical Office Visit  Patient:  Joshua [name]      Age: 81 y.o.       Sex:  male  Date:   01/21/2022  PCP:    [name], [place] Provider: Loralee Pacas, MD  Assessment/Plan:   Joshua [name] was seen today for rash.  Pruritic rash   After long discussion I am pretty convinced that he has contact dermatitis from either the current diap pad or one of the creams that he is using and so I recommend that he change all of that and only use a  strong steroid cream to treat it for now.  If it continues to worsen despite all that then I recommend treating it as scabies we will go ahead and send in the scabies medication.  He also has dermatology pending on Monday.  Decided against antifungal treatment since it is already been treated extensively as tinea cruris and I had evaluated it and felt it did not look like tinea cruris and I am not sure what additional treatment I would give if it is a fungus and not responding to either nystatin or clotrimazole but it would probably be quite toxic and so I will avoid that unless it is confirmed.  Since he seen dermatology on Monday if my recommendation of stopping using his current diaper and trying the steroids and the permethrin does not improve the situation then I would suggest getting skin scraped or even biopsied before attempting to treat further.  Just using Benadryl in the meantime but he cannot tolerate tolerate it with his bladder as he has incontinence  What follows is the artificial intelligence evaluation of his case which I reviewed with him all of the possible diagnostic possibilities  Comprehensive Review of the Case: The patient is a 81 year old individual who has been experiencing an extremely itchy, erythematous, non-scaling, round area of about 3 inches in diameter in the pubic hair region for about 10 days. The condition has been progressively  worsening, and the patient is unsure of what triggered it. The patient has no recent sexual activity and no history of sexually transmitted diseases. The patient has a past medical history of recurring pruritus that usually improves with alcohol, but this time it has not. The patient has tried using Lotrimin cream and steroid creams, but the condition continues to worsen. The affected area is not located in a skin fold. The patient has previously been recommended to use Ammens powder by a urologist, but it did not help. Nystatin has also been tried, but it was not effective. The patient's medications, allergies, social history, physical examination, laboratory data, course of the illness, and any imaging data or other studies are not mentioned in the summary.  Most Likely Dx:  a. Contact Dermatitis: This condition could explain the patient's symptoms of an itchy, erythematous area in the pubic region. Contact dermatitis can be triggered by various substances, including creams and powders that the patient has been using. The presence of a known allergen or irritant that the patient has been exposed to could further suggest this diagnosis.  Expanded DDx: b. Tinea Cruris: Also known as jock itch, this fungal infection could explain the patient's symptoms. Tinea cruris typically affects the groin and surrounding areas and can cause intense itching and a red rash. The presence of a positive fungal culture or improvement with antifungal treatment could further suggest this  diagnosis. c. Psoriasis: This chronic skin condition could explain the patient's symptoms. Psoriasis can cause itchy, red patches of skin. The presence of other areas of the body affected by similar patches, a family history of psoriasis, or a positive skin biopsy could further suggest this diagnosis.  Alternative DDx: d. Lichen Simplex Chronicus: This condition, characterized by chronic itching and scratching, could explain the patient's  symptoms. The presence of lichenified plaques or a history of atopic dermatitis could further suggest this diagnosis. e. Scabies: This parasitic infection could explain the patient's symptoms. The presence of burrows or a positive skin scraping could further suggest this diagnosis. f. Seborrheic Dermatitis: This condition could explain the patient's symptoms. The presence of greasy, yellowish scales on other areas of the body could further suggest this diagnosis. g. Atopic Dermatitis: This condition could explain the patient's symptoms. The presence of a personal or family history of atopy could further suggest this diagnosis. h. Lichen Planus: This condition could explain the patient's symptoms. The presence of purple, polygonal, pruritic papules or plaques on other areas of the body could further suggest this diagnosis. i. Pityriasis Rosea: This condition could explain the patient's symptoms. The presence of a herald patch or a Christmas tree pattern of rash could further suggest this diagnosis. j. Dermatitis Herpetiformis: This condition could explain the patient's symptoms. The presence of a gluten-sensitive enteropathy or a positive direct immunofluorescence test could further suggest this diagnosis.    Today's key discussion points - also in After Visit Summary (AVS) Common side effects, risks, benefits, and alternatives for medications and treatment plan prescribed today were discussed, and he expressed understanding of the given instructions.  Medication list was reconciled and patient instructions and summary information was documented and made available for him to review in the AVS (see AVS).  This note is also available to patient for review for accuracy and understanding. He was encouraged to contact our office by phone or message via MyChart if he has any questions or concerns regarding our treatment plan (see AVS).  No barriers to understanding were identified We discussed red flag  symptoms and signs in detail and when to call the office or go to ER if his condition worsens (see AFTER VISIT SUMMARY).. He expressed understanding.    Subjective:   Joshua [name] is a 81 y.o. 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   "cauterized during prostate OR"   CAD (coronary artery disease)    CABG 2001   Diverticulosis    DJD (degenerative joint disease)    BACK   GERD (gastroesophageal reflux disease)    H/O inguinal hernia repair 12/2018   History of gout    Hyperlipidemia    Hypertension    IBS (irritable bowel syndrome)    Obesity    OSA (obstructive sleep apnea)    USES CPAP    Pancreatitis ~ 1980   Peripheral vascular disease ([place])    Pre-diabetes    Prostate cancer ([place]) 04/2010     He is presenting today with: Chief Complaint  Patient presents with   Rash    Under belly area for about a week and a half. Has spreaded and is itchy.     Additional physician collected history: See Assessment/Plan section for  per problem updates to history (overview and a/p subsections) as reported by patient today. Using alcohol doesn't help Always crotch itch- jock itch.  He has already failed topical steroids and topical antifungal treatment options and this has been going on for jock itch Recurring and going away for a long time years Trimmed hair yesterday- looks just red, doesn't look like crotch itch No sex any more so not an std Beening going on 1.5 weeks getting worse         Objective:  Physical Exam: BP 124/71 (BP Location: Left Arm, Patient Position: Sitting)   Pulse 74   Temp (!) 97.3 F (36.3 C) (Temporal)   Resp 14   Ht 5' 6" (1.676 m)   Wt 212 lb 12.8 oz (96.5 kg)   SpO2 98%   BMI 34.35 kg/m   He is a polite, friendly, and genuine person,  extraordinarily polite and understanding about a long weight Constitutional: NAD, AAO, not ill-appearing, overweight.   Neuro: alert, no focal deficit obvious, articulate speech Psych: normal mood, behavior, thought content   Problem specific physical exam findings:  He has about 3 inch diameter round area  of extremely pruritic erythema not in skin fold.  No plaque or raised erythema. No blistering or pustule.  No excoriations.  Itching extends throughout suprapubic area.   Results:  No results found for any visits on 01/21/22.   Recent Results (from the past 2160 hour(s))  POCT rapid strep A     Status: Normal   Collection Time: 11/03/21  4:19 PM  Result Value Ref Range   Rapid Strep A Screen Negative Negative  POC COVID-19     Status: Normal   Collection Time: 11/03/21  4:20 PM  Result Value Ref Range   SARS Coronavirus 2 Ag Negative Negative  POCT Influenza A/B     Status: Normal   Collection Time: 12/05/21 10:36 AM  Result Value Ref Range   Influenza A, POC Negative Negative   Influenza B, POC Negative Negative  POC COVID-19     Status: Normal   Collection Time: 12/05/21 10:37 AM  Result Value Ref Range   SARS Coronavirus 2 Ag Negative Negative  CBC     Status: None   Collection Time: 12/23/21  3:52 PM  Result Value Ref Range   WBC 7.6 3.4 - 10.8 x10E3/uL   RBC 4.83 4.14 - 5.80 x10E6/uL   Hemoglobin 14.8 13.0 - 17.7 g/dL   Hematocrit 42.8 37.5 - 51.0 %   MCV 89 79 - 97 fL   MCH 30.6 26.6 - 33.0 pg   MCHC 34.6 31.5 - 35.7 g/dL   RDW 11.9 11.6 - 15.4 %   Platelets 274 150 - 450 [id number]  Basic metabolic panel     Status: Abnormal   Collection Time: 12/23/21  3:52 PM  Result Value Ref Range   Glucose 104 (H) 70 - 99 mg/dL   BUN 21 8 - 27 mg/dL   Creatinine, Ser 1.26 0.76 - 1.27 mg/dL   eGFR 58 (L) >59 mL/min/1.73   BUN/Creatinine Ratio 17 10 - 24   Sodium 140 134 - 144 mmol/L   Potassium 4.3 3.5 - 5.2 mmol/L   Chloride 100 96 - 106 mmol/L   CO2 22 20 - 29  mmol/L   Calcium 9.4 8.6 - 10.2 mg/dL

## 2022-01-21 NOTE — Patient Instructions (Signed)
It was a pleasure seeing you today! I truly hope you feel like you received 5 star service and please let me know if there is anything I can improve.  Loralee Pacas, MD   Today the plan is... try permethrin and steroid cream and switching diaper out.    Pruritic rash -     Permethrin; Apply 1 Application topically once for 1 dose. Thoroughly massage cream from head to soles of feet; leave on for 8 to 14 hours (basically put on before bed and then shower in AM) before removing (shower or bath). While you have this on- go ahead and clean all clothes/sheets in hot water. Repeat treatment in 14 days and let us know if this works or not  Dispense: 1 g; Refill: 0 -     Clobetasol Propionate; Apply 1 Application topically 2 (two) times daily.  Dispense: 30 g; Refill: 0       '[x]'$  No follow-ups on file.   '[x]'$  If your condition begins to worsen or become severe:  GO to the ER  '[x]'$  If you are not doing well: RETURN to the office sooner  '[x]'$  If you have follow-up questions / concerns: please contact me  -via phone [phone] OR MyChart messaging   '[x]'$  Please bring all your medicines to each appointment  '[x]'$   You will be contacted with the lab results as soon as they are available. For any labs or imaging tests, we will call you if the results are significantly abnormal.  Most normal results will be posted to myChart as soon as they are available and I will comment on them there within 2-3 business days.  '[x]'$  Billing questions for xray and lab orders are billed from separate companies and questions/concerns should be directed to the [facility].  For visit charges please discuss with our administrative services.  '[x]'$  You will be contacted with the lab results as soon as they are available. The fastest way to get your results is to activate your My Chart account. Instructions are located on the last page of this paperwork. If you have not heard from [place] regarding the results in 2 weeks,  please contact this office.

## 2022-01-22 NOTE — Progress Notes (Signed)
Cardiology Office Note   Date:  01/23/2022   ID:  Joshua [name], DOB 06-25-1940, MRN [id number]  PCP:  [name], MD  Cardiologist:   [name], MD    Chief Complaint  Patient presents with   Coronary Artery Disease      History of Present Illness: Joshua [name] is a 81 y.o. male who presents for follow up of CAD s/p CABG, HTN, HLD, pancreatitis and OSA on CPAP. She had a negative perfusion study in 2015.  He had chest pain in January 2020 and routine treadmill stress test showing no ischemia.    Due to recurrent chest discomfort, patient eventually underwent cardiac catheterization on 04/13/2019 that revealed severe triple-vessel coronary artery disease s/p 5 vessel bypass with 5 out of 5 patent bypass grafts.  The native LAD was occluded beyond the first diagonal branch.  Patent LIMA graft. The circumflex is occluded at the ostium.  The moderate caliber distal OM branch fills from the patent vein graft.  The small caliber more proximal OM fills retrograde from collaterals.  The native RCA is occluded proximally.  The vein graft to the distal RCA is patent. A DES was successfully placed to origin to proximal graft lesion saphenous graft to third marginal.  Patient had normal left ventricular systolic function.  He was discharged on aspirin and Plavix.  He gets around slowly with joint back neck knee problems.  He has had no cardiovascular complaints.  He had none of the chest discomfort that he was having.  He is having physical therapy for his neck.  He denies any new shortness of breath, PND or orthopnea.  He has had no palpitations, presyncope or syncope.  He had no weight gain or edema.   Past Medical History:  Diagnosis Date   Acute medial meniscal tear    Anemia 2015   Arthritis    "middle finger right hand; right knee; neck" (02/06/2014)   Asthma    seasonal   Bladder cancer ([place]) 04/2010   "cauterized during prostate OR"   CAD (coronary artery  disease)    CABG 2001   Diverticulosis    DJD (degenerative joint disease)    BACK   GERD (gastroesophageal reflux disease)    H/O inguinal hernia repair 12/2018   History of gout    Hyperlipidemia    Hypertension    IBS (irritable bowel syndrome)    Obesity    OSA (obstructive sleep apnea)    USES CPAP    Pancreatitis ~ 1980   Peripheral vascular disease ([place])    Pre-diabetes    Prostate cancer ([place]) 04/2010    Past Surgical History:  Procedure Laterality Date   ANTERIOR CERVICAL DECOMP/DISCECTOMY FUSION  05/26/2006   PLATE PLACED   BACK SURGERY     CARDIAC CATHETERIZATION  11/20/1999   CORONARY ARTERY BYPASS GRAFT  11/20/1999   SVG-RI1-RI2, SVG-OM, SVG-dRCA   CORONARY STENT INTERVENTION N/A 04/13/2019   Procedure: CORONARY STENT INTERVENTION;  Surgeon: Burnell Blanks, MD;  Location: [facility];  Service: Cardiovascular;  Laterality: N/A;   HERNIA REPAIR     INGUINAL HERNIA REPAIR Left 01/02/2019   Procedure: OPEN LEFT INGUINAL HERNIA REPAIR WITH MESH;  Surgeon: Johnathan Hausen, MD;  Location: [facility];  Service: General;  Laterality: Left;   KNEE ARTHROSCOPY Right 1992; 04/2008   LAPAROSCOPIC CHOLECYSTECTOMY  2007   LEFT HEART CATH AND CORS/GRAFTS ANGIOGRAPHY N/A 04/13/2019   Procedure: LEFT HEART CATH  AND CORS/GRAFTS ANGIOGRAPHY;  Surgeon: Burnell Blanks, MD;  Location: [facility];  Service: Cardiovascular;  Laterality: N/A;   NASAL SEPTUM SURGERY Right 2007   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   ROBOT ASSISTED LAPAROSCOPIC RADICAL PROSTATECTOMY  04/2010   THYROIDECTOMY, PARTIAL  1980   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   Procedure: LEFT TOTAL KNEE ARTHROPLASTY;  Surgeon: Melrose Nakayama, MD;  Location: [facility];  Service: Orthopedics;  Laterality: Left;     Current Outpatient Medications  Medication Sig Dispense Refill   acetaminophen (TYLENOL) 500 MG tablet Take 1,000 mg by mouth every 6 (six) hours as needed for moderate pain or  headache.     albuterol (PROAIR HFA) 108 (90 Base) MCG/ACT inhaler Inhale 1-2 puffs into the lungs every 6 (six) hours as needed for wheezing or shortness of breath. 18 g 5   Alirocumab (PRALUENT) 150 MG/ML SOAJ INJECT 1 DOSE INTO THE SKIN EVERY 14 (FOURTEEN) DAYS. 6 mL 3   amLODipine (NORVASC) 5 MG tablet TAKE 1 TABLET (5 MG TOTAL) BY MOUTH DAILY. 90 tablet 3   aspirin EC 81 MG tablet Take 1 tablet (81 mg total) by mouth 2 (two) times daily after a meal. 30 tablet 11   cholecalciferol (VITAMIN D3) 25 MCG (1000 UNIT) tablet Take 1,000 Units by mouth daily.     Cyanocobalamin (B-12) 2500 MCG TABS Take 1,250 mcg by mouth daily.     diazepam (VALIUM) 5 MG tablet TAKE 1 TABLET (5 MG TOTAL) BY MOUTH AT BEDTIME AS NEEDED (SLEEP). 30 tablet 1   ezetimibe (ZETIA) 10 MG tablet TAKE 1 TABLET BY MOUTH EVERY DAY 90 tablet 3   FLOVENT HFA 110 MCG/ACT inhaler Inhale 1 puff into the lungs 2 (two) times daily.   5   fluticasone (FLONASE) 50 MCG/ACT nasal spray Place 1 spray into both nostrils 2 (two) times daily.      folic acid (FOLVITE) 277 MCG tablet Take 800 mcg by mouth daily.      gabapentin (NEURONTIN) 300 MG capsule Take 1 cap in AM, 1 cap at noon, 3 caps at bedtime 450 capsule 3   GEMTESA 75 MG TABS TAKE 1 TABLET BY MOUTH DAILY. 30 tablet 11   Glucos-Chond-Hyal Ac-Ca Fructo (MOVE FREE JOINT HEALTH ADVANCE PO) Take 1 tablet by mouth daily.     hydrochlorothiazide (HYDRODIURIL) 25 MG tablet TAKE 1 TABLET BY MOUTH EVERY DAY 90 tablet 2   ipratropium (ATROVENT HFA) 17 MCG/ACT inhaler Inhale 2 puffs into the lungs 3 (three) times daily. 1 each 1   ketoconazole (NIZORAL) 2 % cream Apply 1 application topically See admin instructions. Twice a day every other day     Miconazole Nitrate (LOTRIMIN AF DEODORANT POWDER) 2 % AERP Apply 1 spray topically daily as needed (jock itch).     nitroGLYCERIN (NITROSTAT) 0.4 MG SL tablet PLACE 1 TABLET UNDER THE TONGUE EVERY 5 (FIVE) MINUTES AS NEEDED. 75 tablet 1    pantoprazole (PROTONIX) 40 MG tablet TAKE 1 TABLET BY MOUTH EVERY DAY 90 tablet 3   polyethylene glycol powder (GLYCOLAX/MIRALAX) powder Take 17 g by mouth daily. 255 g 0   valsartan (DIOVAN) 320 MG tablet TAKE 1 TABLET BY MOUTH EVERY DAY 90 tablet 3   Wheat Dextrin (BENEFIBER) POWD Take 1 Dose by mouth daily. 1 dose = 2 teaspoons     EPINEPHrine 0.3 mg/0.3 mL IJ SOAJ injection Inject 0.3 mg into the muscle as needed for anaphylaxis. for allergic reaction (Patient not taking:  Reported on 01/23/2022)     No current facility-administered medications for this visit.    Allergies:   Benadryl [diphenhydramine], Bromfed, Clarithromycin, Codeine, Cymbalta [duloxetine hcl], Doxycycline hyclate, Lexapro [escitalopram], Modafinil, Oxycodone-acetaminophen, Promethazine hcl, Quinolones, Telithromycin, Tramadol, Hydrocodone-acetaminophen, Statins, and Sulfonamide derivatives    ROS:  Please see the history of present illness.   Otherwise, review of systems are positive for none.   All other systems are reviewed and negative.    PHYSICAL EXAM: VS:  BP 120/68   Pulse 84   Ht '5\' 6"'$  (1.676 m)   Wt 211 lb 3.2 oz (95.8 kg)   SpO2 96%   BMI 34.09 kg/m  , BMI Body mass index is 34.09 kg/m. GENERAL:  Well appearing NECK:  No jugular venous distention, waveform within normal limits, carotid upstroke brisk and symmetric, no bruits, no thyromegaly LUNGS:  Clear to auscultation bilaterally CHEST:  Well healed sternotomy scar. HEART:  PMI not displaced or sustained,S1 and S2 within normal limits, no S3, no S4, no clicks, no rubs, no murmurs ABD:  Flat, positive bowel sounds normal in frequency in pitch, no bruits, no rebound, no guarding, no midline pulsatile mass, no hepatomegaly, no splenomegaly EXT:  2 plus pulses throughout, no edema, no cyanosis no clubbing    EKG:  EKG is not ordered today.     Cardiac cath 04/13/19  Diagnostic Dominance: Right Intervention    Recent Labs: 05/13/2021: ALT  18 12/23/2021: BUN 21; Creatinine, Ser 1.26; Hemoglobin 14.8; Platelets 274; Potassium 4.3; Sodium 140    Lipid Panel    Component Value Date/Time   CHOL 129 05/13/2021 1101   CHOL 161 10/22/2020 0835   TRIG 149.0 05/13/2021 1101   HDL 51.60 05/13/2021 1101   HDL 50 10/22/2020 0835   CHOLHDL 3 05/13/2021 1101   VLDL 29.8 05/13/2021 1101   LDLCALC 48 05/13/2021 1101   LDLCALC 85 10/22/2020 0835   LDLCALC 84 02/23/2020 0933   LDLDIRECT 115.0 06/04/2016 0919      Wt Readings from Last 3 Encounters:  01/23/22 211 lb 3.2 oz (95.8 kg)  01/21/22 212 lb 12.8 oz (96.5 kg)  01/12/22 213 lb (96.6 kg)      Other studies Reviewed: Additional studies/ records that were reviewed today include:   Labs. Review of the above records demonstrates:  Please see elsewhere in the note.     ASSESSMENT AND PLAN:  CAD s/p CABG:  The patient has no new sypmtoms.  No further cardiovascular testing is indicated.  We will continue with aggressive risk reduction and meds as listed.  Hyperlipidemia:     He has been followed by Dr. Debara Pickett.  He has excellent LDL and HDL.   Hypertension:   The BP is at target.  No change in therapy.   Obstructive sleep apnea:     He wears a CPAP for his wife.    Current medicines are reviewed at length with the patient today.  The patient does not have concerns regarding medicines.  The following changes have been made: None  Labs/ tests ordered today include: None  No orders of the defined types were placed in this encounter.    Disposition:   FU with me in 6 months.     Signed, [name], MD  01/23/2022 10:55 AM    [place]

## 2022-01-23 ENCOUNTER — Encounter: Payer: Self-pay | Admitting: Cardiology

## 2022-01-23 ENCOUNTER — Ambulatory Visit: Payer: PPO | Attending: Cardiology | Admitting: Cardiology

## 2022-01-23 VITALS — BP 120/68 | HR 84 | Ht 66.0 in | Wt 211.2 lb

## 2022-01-23 DIAGNOSIS — I25810 Atherosclerosis of coronary artery bypass graft(s) without angina pectoris: Secondary | ICD-10-CM | POA: Diagnosis not present

## 2022-01-23 DIAGNOSIS — G4733 Obstructive sleep apnea (adult) (pediatric): Secondary | ICD-10-CM

## 2022-01-23 DIAGNOSIS — E785 Hyperlipidemia, unspecified: Secondary | ICD-10-CM | POA: Diagnosis not present

## 2022-01-23 DIAGNOSIS — I10 Essential (primary) hypertension: Secondary | ICD-10-CM

## 2022-01-23 NOTE — Patient Instructions (Signed)
Medication Instructions:  No changes *If you need a refill on your cardiac medications before your next appointment, please call your pharmacy*   Lab Work: None ordered If you have labs (blood work) drawn today and your tests are completely normal, you will receive your results only by: [place] (if you have MyChart) OR A paper copy in the mail If you have any lab test that is abnormal or we need to change your treatment, we will call you to review the results.   Testing/Procedures: None ordered   Follow-Up: At [facility], you and your health needs are our priority.  As part of our continuing mission to provide you with exceptional heart care, we have created designated Provider Care Teams.  These Care Teams include your primary Cardiologist (physician) and Advanced Practice Providers (APPs -  Physician Assistants and Nurse Practitioners) who all work together to provide you with the care you need, when you need it.  We recommend signing up for the patient portal called "MyChart".  Sign up information is provided on this After Visit Summary.  MyChart is used to connect with patients for Virtual Visits (Telemedicine).  Patients are able to view lab/test results, encounter notes, upcoming appointments, etc.  Non-urgent messages can be sent to your provider as well.   To learn more about what you can do with MyChart, go to [email or web address].    Your next appointment:   6 month(s)  The format for your next appointment:   In Person  Provider:   [name], MD

## 2022-01-26 ENCOUNTER — Ambulatory Visit: Payer: PPO | Admitting: Physical Therapy

## 2022-01-26 ENCOUNTER — Encounter: Payer: Self-pay | Admitting: Physical Therapy

## 2022-01-26 DIAGNOSIS — M25551 Pain in right hip: Secondary | ICD-10-CM | POA: Diagnosis not present

## 2022-01-26 DIAGNOSIS — M25562 Pain in left knee: Secondary | ICD-10-CM | POA: Diagnosis not present

## 2022-01-26 DIAGNOSIS — M542 Cervicalgia: Secondary | ICD-10-CM | POA: Diagnosis not present

## 2022-01-26 DIAGNOSIS — L239 Allergic contact dermatitis, unspecified cause: Secondary | ICD-10-CM | POA: Diagnosis not present

## 2022-01-26 DIAGNOSIS — G8929 Other chronic pain: Secondary | ICD-10-CM

## 2022-01-26 DIAGNOSIS — M25561 Pain in right knee: Secondary | ICD-10-CM

## 2022-01-26 DIAGNOSIS — M5459 Other low back pain: Secondary | ICD-10-CM | POA: Diagnosis not present

## 2022-01-26 NOTE — Therapy (Signed)
OUTPATIENT PHYSICAL THERAPY TREATMENT   Patient Name: Joshua [name] MRN: [id number] DOB:[birth year], 81 y.o., male Today's Date: 01/26/2022     Past Medical History:  Diagnosis Date   Acute medial meniscal tear    Anemia 2015   Arthritis    "middle finger right hand; right knee; neck" (02/06/2014)   Asthma    seasonal   Bladder cancer ([place]) 04/2010   "cauterized during prostate OR"   CAD (coronary artery disease)    CABG 2001   Diverticulosis    DJD (degenerative joint disease)    BACK   GERD (gastroesophageal reflux disease)    H/O inguinal hernia repair 12/2018   History of gout    Hyperlipidemia    Hypertension    IBS (irritable bowel syndrome)    Obesity    OSA (obstructive sleep apnea)    USES CPAP    Pancreatitis ~ 1980   Peripheral vascular disease ([place])    Pre-diabetes    Prostate cancer ([place]) 04/2010   Past Surgical History:  Procedure Laterality Date   ANTERIOR CERVICAL DECOMP/DISCECTOMY FUSION  05/26/2006   PLATE PLACED   BACK SURGERY     CARDIAC CATHETERIZATION  11/20/1999   CORONARY ARTERY BYPASS GRAFT  11/20/1999   SVG-RI1-RI2, SVG-OM, SVG-dRCA   CORONARY STENT INTERVENTION N/A 04/13/2019   Procedure: CORONARY STENT INTERVENTION;  Surgeon: Burnell Blanks, MD;  Location: [facility];  Service: Cardiovascular;  Laterality: N/A;   HERNIA REPAIR     INGUINAL HERNIA REPAIR Left 01/02/2019   Procedure: OPEN LEFT INGUINAL HERNIA REPAIR WITH MESH;  Surgeon: Johnathan Hausen, MD;  [facility];  Service: General;  Laterality: Left;   KNEE ARTHROSCOPY Right 1992; 04/2008   LAPAROSCOPIC CHOLECYSTECTOMY  2007   LEFT HEART CATH AND CORS/GRAFTS ANGIOGRAPHY N/A 04/13/2019   Procedure: LEFT HEART CATH AND CORS/GRAFTS ANGIOGRAPHY;  Surgeon: Burnell Blanks, MD;  Location: [facility];  Service: Cardiovascular;  Laterality: N/A;   NASAL SEPTUM SURGERY Right 2007   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   ROBOT ASSISTED LAPAROSCOPIC  RADICAL PROSTATECTOMY  04/2010   THYROIDECTOMY, PARTIAL  1980   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   Procedure: LEFT TOTAL KNEE ARTHROPLASTY;  Surgeon: Melrose Nakayama, MD;  [facility];  Service: Orthopedics;  Laterality: Left;   Patient Active Problem List   Diagnosis Date Noted   Degenerative cervical disc 01/12/2022   Greater trochanteric bursitis of right hip 02/17/2021   Hyperactive bowel sounds 01/29/2021   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 ([place]) 09/25/2020   Primary osteoarthritis of left knee 06/18/2020   Pre-operative respiratory examination 06/05/2020   Coronary artery disease involving coronary bypass graft of native heart without angina pectoris 04/07/2020   Trigger thumb, left thumb 01/24/2020   Overactive bladder 11/24/2019   Left lateral epicondylitis 09/05/2019   Degenerative arthritis of left knee 08/08/2019   Educated about COVID-19 virus infection 04/20/2019   Unstable angina ([facility])    Greater trochanteric bursitis of left hip 10/27/2018   Degenerative disc disease, lumbar 10/27/2018   Abdominal muscle strain, initial encounter 06/16/2018   Aortic atherosclerosis ([place]) 05/05/2018   Hyperglycemia 10/07/2017   Weakness of left leg 06/21/2017   DJD (degenerative joint disease)    Arthritis    Neck pain 02/08/2017   Venous insufficiency 12/29/2016   Ganglion cyst of tendon sheath of right hand 12/29/2016   Avulsion fracture of metatarsal bone of right foot 11/25/2016   Gout  11/20/2016   Obesity, Class I, BMI 30-34.9 10/03/2015   Hereditary and idiopathic peripheral neuropathy 09/25/2014   Allergic rhinitis 05/31/2014   Fatigue 11/09/2013   CKD (chronic kidney disease), stage III ([place]) 10/23/2013   Dizziness 08/23/2013   Neuropathy ([place]) 08/21/2013   RLS (restless legs syndrome) 01/03/2013   Chronic bilateral low back pain with left-sided sciatica 10/09/2011   History of bladder cancer 04/01/2010   OSTEOARTHRITIS, LOWER  LEG, LEFT 10/04/2009   Generalized abdominal pain 01/31/2009   ANEMIA, IRON DEFICIENCY 11/06/2008   TINNITUS, CHRONIC, 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  Rationale for Evaluation and Treatment Rehabilitation  ONSET DATE:   SUBJECTIVE:                                                                                                                                                                                      SUBJECTIVE STATEMENT:  01/26/2022 No new complaints, neck and back sore today.   Eval: Pt states neck pain that started about 1 month ago, no injury to report. Most pain in High cervical region on R,  Pain comes and goes , pain with L rotation. Headaches at times from neck pain- evening.  Also has pain in :  R hip pain,  R knee-  gives way,  Had L knee replaced- painful all the time.  Back pain bilateral L>R.   Neuropathy- taking gabapentin. - very painful- feels like needles.  Exercise: has bike at clubhouse(not using), likes to walk but now too painful.   Pt has had previous injections:  L hip 08/2021,  06/2021 L4/5    PERTINENT HISTORY: PVD, CAD, CABG, Neuropathy, CKD, cervical fusion, L knee surgery,    PAIN:  Are you having pain? Yes: NPRS scale: 4/10 Pain location: Neck  Pain description: sore  Aggravating factors: L rotation, fleixon  Relieving factors: rest  Are you having pain? Yes: NPRS scale: 5/10 Pain location: R knee Pain description: pain, weak Aggravating factors: increased activity  Relieving factors: rest  Are you having pain? Yes: NPRS scale:  2/10 Pain location: R hip Pain description: pain Aggravating factors: increased activity  Relieving factors: rest   Are you having pain? Yes: NPRS scale: 4 /10 Pain location: Back Pain description: sore, achey Aggravating factors:  Standing/walking  Relieving factors: sitting/rest    PRECAUTIONS: None  WEIGHT BEARING RESTRICTIONS: No  FALLS:  Has patient fallen in last 6 months? No   PLOF: Independent  PATIENT GOALS: Decreased pain in neck, hip, back   OBJECTIVE:   DIAGNOSTIC FINDINGS:  Recent neck xray:  IMPRESSION: C5 through C7 anterior fusion again noted. Hardware intact. Diffuse degenerative change. No acute bony abnormality.   Lumbar MRI:  IMPRESSION: 1. 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 Bilateral neural foraminal narrowing at L4-L5 and L5-S1 secondary to advanced facet joint arthropathy.   2. 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   3.  Grade 1 anterolisthesis of L4, unchanged.   4. 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 Follow-up with annual abdominal sonogram is recommended.  R hip: 01/2021:  Mild joint space loss noted at the right hip. Advanced degenerative changes partially visualized in the lower lumbar spine.  L knee: IMPRESSION: Uncomplicated left total knee prosthesis.    COGNITION: Overall cognitive status: Within functional limits for tasks assessed     POSTURE: rounded shoulders, forward head   UPPER EXTREMITY ROM:   Cervical: L rotation: 55,   R rotation: 65 , flex: mild limitation/pain,  ext: mod/sig limitation:   Shoulder: mild limitation for R>L shoulder flex, IR behind back  Knees:  painful with movement   , L: 0- 110   R: 0- 115  Hips: mild limitation for rotation bil,      UPPER  EXTREMITY MMT:  Shoulders: 4+/5 ,   Knees: 4+/5,   Hips: 4/5    JOINT MOBILITY TESTING:  Hypomobile c-spine    PALPATION:  Tenderness at bil SO, R low occiput, mild tenderness in bil paraspinals,  Tightness in R UT  Tenderness/tightness in R glute musculature, mild in gr troch.   Tenderness in L lumbar/thoracic paraspinals, and central l-spine      TODAY'S TREATMENT:                                                                                                                                                    DATE:    01/26/2022  Therapeutic Exercise: Aerobic: Supine: Seated: Standing: Stretches:  Seated Hamstring stretch 30 sec x 3 bil; pelvic tilts x10;  supine modified piriformis 30 sec x 3 bil;  SKTC with towel 30 sec x 3 bil,   LTR  x10(slight pain);  Prayer stretch for wrist extension x 2 min  Neuromuscular Re-education: Manual Therapy:  Manual Upper trap stretches, STM to bil cervical paraspinals and SO, Light SOR, light manual distraction;       PATIENT EDUCATION: Education details: PT POC, Exam  findings, HEP Person educated: Patient Education method: Explanation, Demonstration, Tactile cues, Verbal cues, and Handouts Education comprehension: verbalized understanding, returned demonstration, verbal cues required, tactile cues required, and needs further education    HOME EXERCISE PROGRAM: Access Code: [email or web address]    ASSESSMENT:  CLINICAL IMPRESSION:  Focus on education for low back mobility and stretches today, updated HEP to include. He has quite a bit of soreness even with light STM and palpation of cervical musculature, will continue to benefit from manual as tolerated for muscle tension and mobility on R.  Pt also with question about hands feeling more tingly lately. Recommended he discuss with MD at next visit for this stemming from carpal tunnel, c-spine, or true neuropathy (which he does have in his feet).   Eval: Patient presents with primary  complaint of increased pain in cervical region. He has soreness in R high cervical region, increased with activity and ROM. He has decreased ROM and poor postural awareness. Pt also has several other areas of pain. R hip/glute with increased pain and muscle tension. He has pain in bil knees, and low back. He also has neuropathy that effects balance. Gait/balance to be further assessed in future. Pt to benefit from skilled PT to improve deficits and pain.    OBJECTIVE IMPAIRMENTS: decreased activity tolerance, decreased balance, decreased knowledge of use of DME, decreased mobility, difficulty walking, decreased ROM, decreased strength, increased muscle spasms, impaired flexibility, improper body mechanics, and pain.   ACTIVITY LIMITATIONS: lifting, bending, standing, squatting, stairs, and locomotion level  PARTICIPATION LIMITATIONS: cleaning, driving, shopping, and community activity  PERSONAL FACTORS: 1-2 comorbidities: multiple pain locations, OA,   are also affecting patient's functional outcome.   REHAB POTENTIAL: Good  CLINICAL DECISION MAKING: Evolving/moderate complexity  EVALUATION COMPLEXITY: Moderate  GOALS: Goals reviewed with patient? Yes  SHORT TERM GOALS: Target date: 02/02/2022    Pt to be independent with HEP  Goal status: INITIAL  2.  Pt to report decreased pain in Neck to 0-3/10    Goal status: INITIAL    LONG TERM GOALS: Target date: 03/16/2022     1.Pt to be independent with final HEP, for neck, knees and back pain   Goal status: INITIAL  2.  Pt to demo ability to self correct posture at least 50 % of the time in clinic.    Goal status: INITIAL  3.  Pt to demo ability for full ROM for c-spine, without pain greater than 2/10, to improve ability for driving and ADLs.   Goal status: INITIAL  4.  Pt to report decreased pain in R hip/glute to 0-3/10 with activity, to improve ability for IADLs.   Goal status: INITIAL    PLAN: PT FREQUENCY:  2x/week  PT DURATION: 8 weeks  PLANNED INTERVENTIONS: Therapeutic exercises, Therapeutic activity, Neuromuscular re-education, Balance training, Gait training, Patient/Family education, Self Care, Joint mobilization, Joint manipulation, Stair training, Orthotic/Fit training, DME instructions, Dry Needling, Electrical stimulation, Spinal manipulation, Spinal mobilization, Cryotherapy, Moist heat, Vasopneumatic device, Traction, Ultrasound, Ionotophoresis '4mg'$ /ml Dexamethasone, and Manual therapy  PLAN FOR NEXT SESSION:   manual for neck, R side muscle tension relief,  R glute muscles,  HEP for back, hip, knees when able.    Lyndee Hensen, PT, DPT 8:01 AM  01/26/22

## 2022-01-28 DIAGNOSIS — J3081 Allergic rhinitis due to animal (cat) (dog) hair and dander: Secondary | ICD-10-CM | POA: Diagnosis not present

## 2022-01-29 ENCOUNTER — Encounter: Payer: Self-pay | Admitting: Physical Therapy

## 2022-01-29 ENCOUNTER — Encounter: Payer: PPO | Admitting: Physical Therapy

## 2022-01-29 ENCOUNTER — Telehealth: Payer: Self-pay | Admitting: Pharmacist

## 2022-01-29 ENCOUNTER — Other Ambulatory Visit (HOSPITAL_BASED_OUTPATIENT_CLINIC_OR_DEPARTMENT_OTHER): Payer: Self-pay

## 2022-01-29 ENCOUNTER — Ambulatory Visit: Payer: PPO | Admitting: Physical Therapy

## 2022-01-29 DIAGNOSIS — G8929 Other chronic pain: Secondary | ICD-10-CM

## 2022-01-29 DIAGNOSIS — M25562 Pain in left knee: Secondary | ICD-10-CM | POA: Diagnosis not present

## 2022-01-29 DIAGNOSIS — M5459 Other low back pain: Secondary | ICD-10-CM

## 2022-01-29 DIAGNOSIS — M542 Cervicalgia: Secondary | ICD-10-CM | POA: Diagnosis not present

## 2022-01-29 DIAGNOSIS — M25561 Pain in right knee: Secondary | ICD-10-CM | POA: Diagnosis not present

## 2022-01-29 DIAGNOSIS — M25551 Pain in right hip: Secondary | ICD-10-CM | POA: Diagnosis not present

## 2022-01-29 MED ORDER — COMIRNATY 30 MCG/0.3ML IM SUSY
PREFILLED_SYRINGE | INTRAMUSCULAR | 0 refills | Status: DC
Start: 2022-01-29 — End: 2022-04-02
  Filled 2022-01-29: qty 0.3, 1d supply, fill #0

## 2022-01-29 NOTE — Therapy (Signed)
OUTPATIENT PHYSICAL THERAPY TREATMENT   Patient Name: Joshua [name] MRN: [id number] DOB:07-May-1940, 81 y.o., male Today's Date: 01/29/2022   PT End of Session - 01/29/22 1021     Visit Number 3    Number of Visits 16    Date for PT Re-Evaluation 03/16/22    Authorization Type HTA    5 prior visits this year for PT    PT Start Time 98    PT Stop Time 1100    PT Time Calculation (min) 39 min    Activity Tolerance Patient tolerated treatment well    Behavior During Therapy [facility] for tasks assessed/performed              Past Medical History:  Diagnosis Date   Acute medial meniscal tear    Anemia 2015   Arthritis    "middle finger right hand; right knee; neck" (02/06/2014)   Asthma    seasonal   Bladder cancer ([place]) 04/2010   "cauterized during prostate OR"   CAD (coronary artery disease)    CABG 2001   Diverticulosis    DJD (degenerative joint disease)    BACK   GERD (gastroesophageal reflux disease)    H/O inguinal hernia repair 12/2018   History of gout    Hyperlipidemia    Hypertension    IBS (irritable bowel syndrome)    Obesity    OSA (obstructive sleep apnea)    USES CPAP    Pancreatitis ~ 1980   Peripheral vascular disease ([place])    Pre-diabetes    Prostate cancer ([place]) 04/2010   Past Surgical History:  Procedure Laterality Date   ANTERIOR CERVICAL DECOMP/DISCECTOMY FUSION  05/26/2006   PLATE PLACED   BACK SURGERY     CARDIAC CATHETERIZATION  11/20/1999   CORONARY ARTERY BYPASS GRAFT  11/20/1999   SVG-RI1-RI2, SVG-OM, SVG-dRCA   CORONARY STENT INTERVENTION N/A 04/13/2019   Procedure: CORONARY STENT INTERVENTION;  Surgeon: Burnell Blanks, MD;  Location: [place] [facility];  Service: Cardiovascular;  Laterality: N/A;   HERNIA REPAIR     INGUINAL HERNIA REPAIR Left 01/02/2019   Procedure: OPEN LEFT INGUINAL HERNIA REPAIR WITH MESH;  Surgeon: Johnathan Hausen, MD;  Location: [facility];  Service: General;  Laterality: Left;   KNEE ARTHROSCOPY  Right 1992; 04/2008   LAPAROSCOPIC CHOLECYSTECTOMY  2007   LEFT HEART CATH AND CORS/GRAFTS ANGIOGRAPHY N/A 04/13/2019   Procedure: LEFT HEART CATH AND CORS/GRAFTS ANGIOGRAPHY;  Surgeon: Burnell Blanks, MD;  Location: [place] [facility];  Service: Cardiovascular;  Laterality: N/A;   NASAL SEPTUM SURGERY Right 2007   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   ROBOT ASSISTED LAPAROSCOPIC RADICAL PROSTATECTOMY  04/2010   THYROIDECTOMY, PARTIAL  1980   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   Procedure: LEFT TOTAL KNEE ARTHROPLASTY;  Surgeon: Melrose Nakayama, MD;  Location: [facility];  Service: Orthopedics;  Laterality: Left;   Patient Active Problem List   Diagnosis Date Noted   Degenerative cervical disc 01/12/2022   Greater trochanteric bursitis of right hip 02/17/2021   Hyperactive bowel sounds 01/29/2021   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 ([place]) 09/25/2020   Primary osteoarthritis of left knee 06/18/2020   Pre-operative respiratory examination 06/05/2020   Coronary artery disease involving coronary bypass graft of native heart without angina pectoris 04/07/2020   Trigger thumb, left thumb 01/24/2020   Overactive bladder 11/24/2019   Left lateral epicondylitis 09/05/2019   Degenerative arthritis of left knee 08/08/2019   Educated  about COVID-19 virus infection 04/20/2019   Unstable angina ([facility])    Greater trochanteric bursitis of left hip 10/27/2018   Degenerative disc disease, lumbar 10/27/2018   Abdominal muscle strain, initial encounter 06/16/2018   Aortic atherosclerosis ([place]) 05/05/2018   Hyperglycemia 10/07/2017   Weakness of left leg 06/21/2017   DJD (degenerative joint disease)    Arthritis    Neck pain 02/08/2017   Venous insufficiency 12/29/2016   Ganglion cyst of tendon sheath of right hand 12/29/2016   Avulsion fracture of metatarsal bone of right foot 11/25/2016   Gout 11/20/2016   Obesity, Class I, BMI 30-34.9 10/03/2015   Hereditary and  idiopathic peripheral neuropathy 09/25/2014   Allergic rhinitis 05/31/2014   Fatigue 11/09/2013   CKD (chronic kidney disease), stage III ([place]) 10/23/2013   Dizziness 08/23/2013   Neuropathy ([place]) 08/21/2013   RLS (restless legs syndrome) 01/03/2013   Chronic bilateral low back pain with left-sided sciatica 10/09/2011   History of bladder cancer 04/01/2010   OSTEOARTHRITIS, LOWER LEG, LEFT 10/04/2009   Generalized abdominal pain 01/31/2009   ANEMIA, IRON DEFICIENCY 11/06/2008   TINNITUS, CHRONIC, 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   Essential hypertension 02/21/2007   OSA (obstructive sleep apnea) 11/02/2006   Hyperlipidemia 10/12/2006   Major depression in full remission ([place]) 10/12/2006   Coronary atherosclerosis 10/12/2006   Asthma 10/12/2006   GERD 10/12/2006   Irritable bowel syndrome 10/12/2006    PCP: [name]   REFERRING PROVIDER: Charlann Boxer  REFERRING DIAG: Neck pain, R hip pain   THERAPY DIAG:  Cervicalgia  Other low back pain  Pain in right hip  Chronic pain of right knee  Chronic pain of left knee  Rationale for Evaluation and Treatment Rehabilitation  ONSET DATE:   SUBJECTIVE:                                                                                                                                                                                      SUBJECTIVE STATEMENT:  01/29/2022 No new complaints, neck and back sore today.   Eval: Pt states neck pain that started about 1 month ago, no injury to report. Most pain in High cervical region on R,  Pain comes and goes , pain with L rotation. Headaches at times from neck pain- evening.  Also has pain in :  R hip pain,  R knee-  gives way,  Had L knee replaced- painful all the time.  Back pain bilateral L>R.   Neuropathy- taking gabapentin. - very painful- feels like needles.  Exercise: has bike at clubhouse(not using), likes to walk but now too painful.   Pt has had previous injections:  L hip  08/2021,  06/2021 L4/5    PERTINENT HISTORY: PVD, CAD, CABG, Neuropathy, CKD, cervical fusion, L knee surgery,    PAIN:  Are you having pain? Yes: NPRS scale: 4/10 Pain location: Neck  Pain description: sore  Aggravating factors: L rotation, fleixon  Relieving factors: rest  Are you having pain? Yes: NPRS scale: 5/10 Pain location: R knee Pain description: pain, weak Aggravating factors: increased activity  Relieving factors: rest  Are you having pain? Yes: NPRS scale: 2/10 Pain location: R hip Pain description: pain Aggravating factors: increased activity  Relieving factors: rest   Are you having pain? Yes: NPRS scale: 4 /10 Pain location: Back Pain description: sore, achey Aggravating factors: Standing/walking  Relieving factors: sitting/rest    PRECAUTIONS: None  WEIGHT BEARING RESTRICTIONS: No  FALLS:  Has patient fallen in last 6 months? No   PLOF: Independent  PATIENT GOALS: Decreased pain in neck, hip, back   OBJECTIVE:   DIAGNOSTIC FINDINGS:  Recent neck xray:  IMPRESSION: C5 through C7 anterior fusion again noted. Hardware intact. Diffuse degenerative change. No acute bony abnormality.   Lumbar MRI:  IMPRESSION: 1. 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 Bilateral neural foraminal narrowing at L4-L5 and L5-S1 secondary to advanced facet joint arthropathy.   2. 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   3.  Grade 1 anterolisthesis of L4, unchanged.   4. 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 Follow-up with annual abdominal sonogram is recommended.  R hip: 01/2021:  Mild joint space loss noted at the right hip. Advanced degenerative changes partially visualized in the lower lumbar spine.  L knee:  IMPRESSION: Uncomplicated left total knee prosthesis.    COGNITION: Overall cognitive status: Within functional limits for tasks assessed     POSTURE: rounded shoulders, forward head   UPPER EXTREMITY ROM:   Cervical: L rotation: 55,   R rotation: 65 , flex: mild limitation/pain,  ext: mod/sig limitation:   Shoulder: mild limitation for R>L shoulder flex, IR behind back  Knees:  painful with movement   , L: 0- 110   R: 0- 115  Hips: mild limitation for rotation bil,      UPPER EXTREMITY MMT:  Shoulders: 4+/5 ,   Knees: 4+/5,   Hips: 4/5    JOINT MOBILITY TESTING:  Hypomobile c-spine    PALPATION:  Tenderness at bil SO, R low occiput, mild tenderness in bil paraspinals,  Tightness in R UT  Tenderness/tightness in R glute musculature, mild in gr troch.   Tenderness in L lumbar/thoracic paraspinals, and central l-spine      TODAY'S TREATMENT:  DATE:    01/29/2022  Therapeutic Exercise: Aerobic: Supine: Seated: Standing: Stretches:  Seated Hamstring stretch 30 sec x 3 bil; pelvic tilts x10;  supine modified piriformis 30 sec x 3 bil;  SKTC with towel 30 sec x 3 bil,    Supine pec/chest stretch x 2 min; Supine UT stretch 30 sec x 2 bil;  Neuromuscular Re-education: Manual Therapy:  Manual Upper trap stretches, STM to bil cervical paraspinals and SO,  SOR,  Manual ROM for c-spine for SB and rotation.      PATIENT EDUCATION: Education details: reviewed HEP Person educated: Patient Education method: Consulting civil engineer, Demonstration, Tactile cues, Verbal cues, and Handouts Education comprehension: verbalized understanding, returned demonstration, verbal cues required, tactile cues required, and needs further education    HOME EXERCISE PROGRAM: Access Code: [email or web address]    ASSESSMENT:  CLINICAL  IMPRESSION: 01/29/2022 Pt with mild decrease in pain with light touch to R cervical paraspinals, still tender and sore , but with improved tolerance for manual release today. He has pain and limitation with rotation, but some improvement noted after manual and in supine position today. Plan to progress as able.   Eval: Patient presents with primary complaint of increased pain in cervical region. He has soreness in R high cervical region, increased with activity and ROM. He has decreased ROM and poor postural awareness. Pt also has several other areas of pain. R hip/glute with increased pain and muscle tension. He has pain in bil knees, and low back. He also has neuropathy that effects balance. Gait/balance to be further assessed in future. Pt to benefit from skilled PT to improve deficits and pain.    OBJECTIVE IMPAIRMENTS: decreased activity tolerance, decreased balance, decreased knowledge of use of DME, decreased mobility, difficulty walking, decreased ROM, decreased strength, increased muscle spasms, impaired flexibility, improper body mechanics, and pain.   ACTIVITY LIMITATIONS: lifting, bending, standing, squatting, stairs, and locomotion level  PARTICIPATION LIMITATIONS: cleaning, driving, shopping, and community activity  PERSONAL FACTORS: 1-2 comorbidities: multiple pain locations, OA,   are also affecting patient's functional outcome.   REHAB POTENTIAL: Good  CLINICAL DECISION MAKING: Evolving/moderate complexity  EVALUATION COMPLEXITY: Moderate  GOALS: Goals reviewed with patient? Yes  SHORT TERM GOALS: Target date: 02/02/2022    Pt to be independent with HEP  Goal status: INITIAL  2.  Pt to report decreased pain in Neck to 0-3/10    Goal status: INITIAL    LONG TERM GOALS: Target date: 03/16/2022     1.Pt to be independent with final HEP, for neck, knees and back pain   Goal status: INITIAL  2.  Pt to demo ability to self correct posture at least 50 % of the time  in clinic.    Goal status: INITIAL  3.  Pt to demo ability for full ROM for c-spine, without pain greater than 2/10, to improve ability for driving and ADLs.   Goal status: INITIAL  4.  Pt to report decreased pain in R hip/glute to 0-3/10 with activity, to improve ability for IADLs.   Goal status: INITIAL    PLAN: PT FREQUENCY: 2x/week  PT DURATION: 8 weeks  PLANNED INTERVENTIONS: Therapeutic exercises, Therapeutic activity, Neuromuscular re-education, Balance training, Gait training, Patient/Family education, Self Care, Joint mobilization, Joint manipulation, Stair training, Orthotic/Fit training, DME instructions, Dry Needling, Electrical stimulation, Spinal manipulation, Spinal mobilization, Cryotherapy, Moist heat, Vasopneumatic device, Traction, Ultrasound, Ionotophoresis '4mg'$ /ml Dexamethasone, and Manual therapy  PLAN FOR NEXT SESSION:   manual for neck, R side muscle tension relief,  R glute muscles,  HEP for back, hip, knees when able.    Lyndee Hensen, PT, DPT 10:21 AM  01/29/22

## 2022-01-29 NOTE — Progress Notes (Signed)
Chronic Care Management Pharmacy Assistant   Name: Joshua [name]  MRN: [id number] DOB: 07/21/1940   Reason for Encounter: General Adherence Call    Recent office visits:  01/21/2022 OV (Fam Med) Loralee Pacas, MD;  Permethrin for rash  01/01/2022 OV (PCP) [name], MD; no medication changes noted.  12/05/2021 OV (Fam Med) Jeanie Sewer, NP; Atrovent and Zithromax for cough  Recent consult visits:  01/23/2022 OV (Cardiology) [name], MD; no medication changes indicated.  01/12/2022 OV (Sports Medicine) Lyndal Pulley, DO; no medication changes indicated.  12/23/2021 OV (Cardiology) Deberah Pelton, NP; [facility] visits:  None in previous 6 months  Medications: Outpatient Encounter Medications as of 01/29/2022  Medication Sig Note   acetaminophen (TYLENOL) 500 MG tablet Take 1,000 mg by mouth every 6 (six) hours as needed for moderate pain or headache.    albuterol (PROAIR HFA) 108 (90 Base) MCG/ACT inhaler Inhale 1-2 puffs into the lungs every 6 (six) hours as needed for wheezing or shortness of breath.    Alirocumab (PRALUENT) 150 MG/ML SOAJ INJECT 1 DOSE INTO THE SKIN EVERY 14 (FOURTEEN) DAYS.    amLODipine (NORVASC) 5 MG tablet TAKE 1 TABLET (5 MG TOTAL) BY MOUTH DAILY.    aspirin EC 81 MG tablet Take 1 tablet (81 mg total) by mouth 2 (two) times daily after a meal. 11/01/2020: Patient is taking only 1 tab/day   cholecalciferol (VITAMIN D3) 25 MCG (1000 UNIT) tablet Take 1,000 Units by mouth daily.    Cyanocobalamin (B-12) 2500 MCG TABS Take 1,250 mcg by mouth daily.    diazepam (VALIUM) 5 MG tablet TAKE 1 TABLET (5 MG TOTAL) BY MOUTH AT BEDTIME AS NEEDED (SLEEP).    EPINEPHrine 0.3 mg/0.3 mL IJ SOAJ injection Inject 0.3 mg into the muscle as needed for anaphylaxis. for allergic reaction (Patient not taking: Reported on 01/23/2022) 01/29/2021: On hand   ezetimibe (ZETIA) 10 MG tablet TAKE 1 TABLET BY MOUTH EVERY DAY     FLOVENT HFA 110 MCG/ACT inhaler Inhale 1 puff into the lungs 2 (two) times daily.     fluticasone (FLONASE) 50 MCG/ACT nasal spray Place 1 spray into both nostrils 2 (two) times daily.     folic acid (FOLVITE) 151 MCG tablet Take 800 mcg by mouth daily.  11/01/2020: Pt is taking 800 mcg unable to find 400 mcg.   gabapentin (NEURONTIN) 300 MG capsule Take 1 cap in AM, 1 cap at noon, 3 caps at bedtime    GEMTESA 75 MG TABS TAKE 1 TABLET BY MOUTH DAILY.    Glucos-Chond-Hyal Ac-Ca Fructo (MOVE FREE JOINT HEALTH ADVANCE PO) Take 1 tablet by mouth daily.    hydrochlorothiazide (HYDRODIURIL) 25 MG tablet TAKE 1 TABLET BY MOUTH EVERY DAY    ipratropium (ATROVENT HFA) 17 MCG/ACT inhaler Inhale 2 puffs into the lungs 3 (three) times daily.    ketoconazole (NIZORAL) 2 % cream Apply 1 application topically See admin instructions. Twice a day every other day    Miconazole Nitrate (LOTRIMIN AF DEODORANT POWDER) 2 % AERP Apply 1 spray topically daily as needed (jock itch).    nitroGLYCERIN (NITROSTAT) 0.4 MG SL tablet PLACE 1 TABLET UNDER THE TONGUE EVERY 5 (FIVE) MINUTES AS NEEDED. 01/29/2021: On hand   pantoprazole (PROTONIX) 40 MG tablet TAKE 1 TABLET BY MOUTH EVERY DAY    polyethylene glycol powder (GLYCOLAX/MIRALAX) powder Take 17 g by mouth daily.    valsartan (DIOVAN) 320 MG tablet TAKE 1  TABLET BY MOUTH EVERY DAY    Wheat Dextrin (BENEFIBER) POWD Take 1 Dose by mouth daily. 1 dose = 2 teaspoons    No facility-administered encounter medications on file as of 01/29/2022.   Contacted [name] for General Review Call   Chart Review:  Have there been any documented new, changed, or discontinued medications since last visit? Yes, patient discontinued cymbalta. Has there been any documented recent hospitalizations or ED visits since last visit with Clinical Pharmacist? No   Adherence Review:  Does the Clinical Pharmacist Assistant have access to adherence rates? {yes/no:20286} Adherence rates  for STAR metric medications (List medication(s)/day supply/ last 2 fill dates). Adherence rates for medications indicated for disease state being reviewed (List medication(s)/day supply/ last 2 fill dates). Does the patient have >5 day gap between last estimated fill dates for any of the above medications or other medication gaps? {yes/no:20286} Reason for medication gaps.   Disease State Questions:  Able to connect with Patient? {yes/no:20286} Did patient have any problems with their health recently? {yes/no:20286} Note problems and Concerns: Have you had any admissions or emergency room visits or worsening of your condition(s) since last visit? {yes/no:20286} Details of ED visit, hospital visit and/or worsening condition(s): Have you had any visits with new specialists or providers since your last visit? {yes/no:20286} Explain: Have you had any new health care problem(s) since your last visit? {yes/no:20286} New problem(s) reported: Have you run out of any of your medications since you last spoke with clinical pharmacist? {yes/no:20286} What caused you to run out of your medications? Are there any medications you are not taking as prescribed? {yes/no:20286} What kept you from taking your medications as prescribed? Are you having any issues or side effects with your medications? {yes/no:20286} Note of issues or side effects: Do you have any other health concerns or questions you want to discuss with your Clinical Pharmacist before your next visit? {yes/no:20286} Note additional concerns and questions from Patient. Are there any health concerns that you feel we can do a better job addressing? {yes/no:20286} Note Patient's response. Are you having any problems with any of the following since the last visit: (select all that apply)  Ambulating/Walking and Climbing stairs  Details: Patient states he is improving with PT. He also states his wife cooks his meals for him. 12. Any falls since  last visit? No  13. Any increased or uncontrolled pain since last visit? No   Patient states he has some improvement with constant pressure and pain in his abdomen since he stopped taking pantoprazole. Patient scheduled a follow up appointment with clinical pharmacist to discuss more on 02/24/2022 at 9 am.  Care Gaps: Medicare Annual Wellness: Completed 05/22/2021 Hemoglobin A1C: 5.7% on 05/13/2021 Colonoscopy: Completed 05/07/2021  Future Appointments  Date Time Provider [place]  02/24/2022  9:00 AM [facility] CCM PHARMACIST [facility]  03/12/2022  9:00 AM Lyndal Pulley, DO [facility] None  03/20/2022  9:30 AM Lyndee Hensen, PT [facility] None  04/02/2022  9:20 AM [name], MD [facility]  04/27/2022 10:00 AM Cameron Sprang, MD [facility] None  06/04/2022  8:45 AM [facility] HEALTH COACH [facility]  07/03/2022  9:00 AM [name], MD [facility]   Star Rating Drugs: Valsartan 320 mg last filled 01/08/2022 90 DS   April D Calhoun, [place] Pharmacist Assistant [phone]

## 2022-02-02 ENCOUNTER — Ambulatory Visit: Payer: PPO | Admitting: Physical Therapy

## 2022-02-02 ENCOUNTER — Encounter: Payer: Self-pay | Admitting: Physical Therapy

## 2022-02-02 ENCOUNTER — Encounter: Payer: PPO | Admitting: Physical Therapy

## 2022-02-02 DIAGNOSIS — M25561 Pain in right knee: Secondary | ICD-10-CM | POA: Diagnosis not present

## 2022-02-02 DIAGNOSIS — M25551 Pain in right hip: Secondary | ICD-10-CM | POA: Diagnosis not present

## 2022-02-02 DIAGNOSIS — M542 Cervicalgia: Secondary | ICD-10-CM | POA: Diagnosis not present

## 2022-02-02 DIAGNOSIS — G8929 Other chronic pain: Secondary | ICD-10-CM | POA: Diagnosis not present

## 2022-02-02 DIAGNOSIS — M5459 Other low back pain: Secondary | ICD-10-CM

## 2022-02-02 DIAGNOSIS — M25562 Pain in left knee: Secondary | ICD-10-CM

## 2022-02-02 NOTE — Therapy (Signed)
OUTPATIENT PHYSICAL THERAPY TREATMENT   Patient Name: Joshua [name] MRN: [id number] DOB:1941/02/08, 81 y.o., male Today's Date: 02/02/2022   PT End of Session - 02/02/22 1142     Visit Number 4    Number of Visits 16    Date for PT Re-Evaluation 03/16/22    Authorization Type HTA    5 prior visits this year for PT    PT Start Time 0846    PT Stop Time 0930    PT Time Calculation (min) 44 min    Activity Tolerance Patient tolerated treatment well    Behavior During Therapy [facility] for tasks assessed/performed               Past Medical History:  Diagnosis Date   Acute medial meniscal tear    Anemia 2015   Arthritis    "middle finger right hand; right knee; neck" (02/06/2014)   Asthma    seasonal   Bladder cancer ([place]) 04/2010   "cauterized during prostate OR"   CAD (coronary artery disease)    CABG 2001   Diverticulosis    DJD (degenerative joint disease)    BACK   GERD (gastroesophageal reflux disease)    H/O inguinal hernia repair 12/2018   History of gout    Hyperlipidemia    Hypertension    IBS (irritable bowel syndrome)    Obesity    OSA (obstructive sleep apnea)    USES CPAP    Pancreatitis ~ 1980   Peripheral vascular disease ([place])    Pre-diabetes    Prostate cancer ([place]) 04/2010   Past Surgical History:  Procedure Laterality Date   ANTERIOR CERVICAL DECOMP/DISCECTOMY FUSION  05/26/2006   PLATE PLACED   BACK SURGERY     CARDIAC CATHETERIZATION  11/20/1999   CORONARY ARTERY BYPASS GRAFT  11/20/1999   SVG-RI1-RI2, SVG-OM, SVG-dRCA   CORONARY STENT INTERVENTION N/A 04/13/2019   Procedure: CORONARY STENT INTERVENTION;  Surgeon: Burnell Blanks, MD;  Location: [facility];  Service: Cardiovascular;  Laterality: N/A;   HERNIA REPAIR     INGUINAL HERNIA REPAIR Left 01/02/2019   Procedure: OPEN LEFT INGUINAL HERNIA REPAIR WITH MESH;  Surgeon: Johnathan Hausen, MD;  Location: [facility];  Service: General;  Laterality: Left;   KNEE  ARTHROSCOPY Right 1992; 04/2008   LAPAROSCOPIC CHOLECYSTECTOMY  2007   LEFT HEART CATH AND CORS/GRAFTS ANGIOGRAPHY N/A 04/13/2019   Procedure: LEFT HEART CATH AND CORS/GRAFTS ANGIOGRAPHY;  Surgeon: Burnell Blanks, MD;  Location: [facility];  Service: Cardiovascular;  Laterality: N/A;   NASAL SEPTUM SURGERY Right 2007   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   ROBOT ASSISTED LAPAROSCOPIC RADICAL PROSTATECTOMY  04/2010   THYROIDECTOMY, PARTIAL  1980   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   Procedure: LEFT TOTAL KNEE ARTHROPLASTY;  Surgeon: Melrose Nakayama, MD;  Location: [facility];  Service: Orthopedics;  Laterality: Left;   Patient Active Problem List   Diagnosis Date Noted   Degenerative cervical disc 01/12/2022   Greater trochanteric bursitis of right hip 02/17/2021   Hyperactive bowel sounds 01/29/2021   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 ([place]) 09/25/2020   Primary osteoarthritis of left knee 06/18/2020   Pre-operative respiratory examination 06/05/2020   Coronary artery disease involving coronary bypass graft of native heart without angina pectoris 04/07/2020   Trigger thumb, left thumb 01/24/2020   Overactive bladder 11/24/2019   Left lateral epicondylitis 09/05/2019   Degenerative arthritis of left knee 08/08/2019  Educated about COVID-19 virus infection 04/20/2019   Unstable angina ([facility])    Greater trochanteric bursitis of left hip 10/27/2018   Degenerative disc disease, lumbar 10/27/2018   Abdominal muscle strain, initial encounter 06/16/2018   Aortic atherosclerosis ([place]) 05/05/2018   Hyperglycemia 10/07/2017   Weakness of left leg 06/21/2017   DJD (degenerative joint disease)    Arthritis    Neck pain 02/08/2017   Venous insufficiency 12/29/2016   Ganglion cyst of tendon sheath of right hand 12/29/2016   Avulsion fracture of metatarsal bone of right foot 11/25/2016   Gout 11/20/2016   Obesity, Class I, BMI 30-34.9 10/03/2015    Hereditary and idiopathic peripheral neuropathy 09/25/2014   Allergic rhinitis 05/31/2014   Fatigue 11/09/2013   CKD (chronic kidney disease), stage III ([place]) 10/23/2013   Dizziness 08/23/2013   Neuropathy ([place]) 08/21/2013   RLS (restless legs syndrome) 01/03/2013   Chronic bilateral low back pain with left-sided sciatica 10/09/2011   History of bladder cancer 04/01/2010   OSTEOARTHRITIS, LOWER LEG, LEFT 10/04/2009   Generalized abdominal pain 01/31/2009   ANEMIA, IRON DEFICIENCY 11/06/2008   TINNITUS, CHRONIC, 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   Essential hypertension 02/21/2007   OSA (obstructive sleep apnea) 11/02/2006   Hyperlipidemia 10/12/2006   Major depression in full remission ([place]) 10/12/2006   Coronary atherosclerosis 10/12/2006   Asthma 10/12/2006   GERD 10/12/2006   Irritable bowel syndrome 10/12/2006    PCP: [name]   REFERRING PROVIDER: Charlann Boxer  REFERRING DIAG: Neck pain, R hip pain   THERAPY DIAG:  Cervicalgia  Other low back pain  Pain in right hip  Chronic pain of right knee  Chronic pain of left knee  Rationale for Evaluation and Treatment Rehabilitation  ONSET DATE:   SUBJECTIVE:                                                                                                                                                                                      SUBJECTIVE STATEMENT:  02/02/2022 No new complaints, neck maybe a little less sore,  back sore today.   Eval: Pt states neck pain that started about 1 month ago, no injury to report. Most pain in High cervical region on R,  Pain comes and goes , pain with L rotation. Headaches at times from neck pain- evening.  Also has pain in :  R hip pain,  R knee-  gives way,  Had L knee replaced- painful all the time.  Back pain bilateral L>R.   Neuropathy- taking gabapentin. - very painful- feels like needles.  Exercise: has bike at clubhouse(not using), likes to walk but now too painful.   Pt has  had previous injections:  L hip 08/2021,  06/2021 L4/5    PERTINENT HISTORY: PVD, CAD, CABG, Neuropathy, CKD, cervical fusion, L knee surgery,    PAIN:  Are you having pain? Yes: NPRS scale: 4/10 Pain location: Neck  Pain description: sore  Aggravating factors: L rotation, fleixon  Relieving factors: rest  Are you having pain? Yes: NPRS scale: 5/10 Pain location: R knee Pain description: pain, weak Aggravating factors: increased activity  Relieving factors: rest  Are you having pain? Yes: NPRS scale: 2/10 Pain location: R hip Pain description: pain Aggravating factors: increased activity  Relieving factors: rest   Are you having pain? Yes: NPRS scale: 4 /10 Pain location: Back Pain description: sore, achey Aggravating factors: Standing/walking  Relieving factors: sitting/rest    PRECAUTIONS: None  WEIGHT BEARING RESTRICTIONS: No  FALLS:  Has patient fallen in last 6 months? No   PLOF: Independent  PATIENT GOALS: Decreased pain in neck, hip, back   OBJECTIVE:   DIAGNOSTIC FINDINGS:  Recent neck xray:  IMPRESSION: C5 through C7 anterior fusion again noted. Hardware intact. Diffuse degenerative change. No acute bony abnormality.   Lumbar MRI:  IMPRESSION: 1. 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 Bilateral neural foraminal narrowing at L4-L5 and L5-S1 secondary to advanced facet joint arthropathy.   2. 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   3.  Grade 1 anterolisthesis of L4, unchanged.   4. 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 Follow-up with annual abdominal sonogram is recommended.  R hip: 01/2021:  Mild joint space loss noted at the right hip. Advanced degenerative changes partially visualized in the lower lumbar  spine.  L knee: IMPRESSION: Uncomplicated left total knee prosthesis.    COGNITION: Overall cognitive status: Within functional limits for tasks assessed     POSTURE: rounded shoulders, forward head   UPPER EXTREMITY ROM:   Cervical: L rotation: 55,   R rotation: 65 , flex: mild limitation/pain,  ext: mod/sig limitation:   Shoulder: mild limitation for R>L shoulder flex, IR behind back  Knees:  painful with movement   , L: 0- 110   R: 0- 115  Hips: mild limitation for rotation bil,      UPPER EXTREMITY MMT:  Shoulders: 4+/5 ,   Knees: 4+/5,   Hips: 4/5    JOINT MOBILITY TESTING:  Hypomobile c-spine    PALPATION:  Tenderness at bil SO, R low occiput, mild tenderness in bil paraspinals,  Tightness in R UT  Tenderness/tightness in R glute musculature, mild in gr troch.   Tenderness in L lumbar/thoracic paraspinals, and central l-spine      TODAY'S TREATMENT:  DATE:    02/02/2022  Therapeutic Exercise: Aerobic: Supine: Seated: LAQ x 20 bil;  Standing: Sit to stand 2 x 5;  Stretches:  Seated Hamstring stretch 30 sec x 3 bil;  seated pelvic tilts x10;  seated piriformis 30 sec x 3 bil;  SKTC with towel 30 sec x 3 bil,   LTR x 15;  Supine pec/chest stretch x 2 min; Supine UT stretch 30 sec x 2 bil;  Neuromuscular Re-education:  Manual Therapy:  Manual Upper trap stretches, STM to bil cervical paraspinals and SO,  SOR,  Manual ROM for c-spine for SB and rotation.      PATIENT EDUCATION: Education details: reviewed HEP Person educated: Patient Education method: Explanation, Demonstration, Tactile cues, Verbal cues, and Handouts Education comprehension: verbalized understanding, returned demonstration, verbal cues required, tactile cues required, and needs further education    HOME EXERCISE PROGRAM: Access Code:  [email or web address]    ASSESSMENT:  CLINICAL IMPRESSION: 02/02/2022 Pt with soreness in R Sub occipital region with manual  today. He has pain only at end range for rotation today at end of session. Reviewed mobility for back, and importance of practicing consistently for best results. Also reviewed importance of upright posture in seated position for neck and back pain  .Pt to benefit from continued care.   Eval: Patient presents with primary complaint of increased pain in cervical region. He has soreness in R high cervical region, increased with activity and ROM. He has decreased ROM and poor postural awareness. Pt also has several other areas of pain. R hip/glute with increased pain and muscle tension. He has pain in bil knees, and low back. He also has neuropathy that effects balance. Gait/balance to be further assessed in future. Pt to benefit from skilled PT to improve deficits and pain.    OBJECTIVE IMPAIRMENTS: decreased activity tolerance, decreased balance, decreased knowledge of use of DME, decreased mobility, difficulty walking, decreased ROM, decreased strength, increased muscle spasms, impaired flexibility, improper body mechanics, and pain.   ACTIVITY LIMITATIONS: lifting, bending, standing, squatting, stairs, and locomotion level  PARTICIPATION LIMITATIONS: cleaning, driving, shopping, and community activity  PERSONAL FACTORS: 1-2 comorbidities: multiple pain locations, OA,   are also affecting patient's functional outcome.   REHAB POTENTIAL: Good  CLINICAL DECISION MAKING: Evolving/moderate complexity  EVALUATION COMPLEXITY: Moderate  GOALS: Goals reviewed with patient? Yes  SHORT TERM GOALS: Target date: 02/02/2022    Pt to be independent with HEP  Goal status: INITIAL  2.  Pt to report decreased pain in Neck to 0-3/10    Goal status: INITIAL    LONG TERM GOALS: Target date: 03/16/2022     1.Pt to be independent with final HEP, for neck, knees and back pain   Goal  status: INITIAL  2.  Pt to demo ability to self correct posture at least 50 % of the time in clinic.    Goal status: INITIAL  3.  Pt to demo ability for full ROM for c-spine, without pain greater than 2/10, to improve ability for driving and ADLs.   Goal status: INITIAL  4.  Pt to report decreased pain in R hip/glute to 0-3/10 with activity, to improve ability for IADLs.   Goal status: INITIAL    PLAN: PT FREQUENCY: 2x/week  PT DURATION: 8 weeks  PLANNED INTERVENTIONS: Therapeutic exercises, Therapeutic activity, Neuromuscular re-education, Balance training, Gait training, Patient/Family education, Self Care, Joint mobilization, Joint manipulation, Stair training, Orthotic/Fit training, DME instructions, Dry Needling, Electrical stimulation, Spinal manipulation, Spinal mobilization, Cryotherapy, Moist  heat, Vasopneumatic device, Traction, Ultrasound, Ionotophoresis '4mg'$ /ml Dexamethasone, and Manual therapy  PLAN FOR NEXT SESSION:   manual for neck, R side muscle tension relief,  R glute muscles,  HEP for back, hip, knees when able.    Lyndee Hensen, PT, DPT 11:43 AM  02/02/22

## 2022-02-04 DIAGNOSIS — J301 Allergic rhinitis due to pollen: Secondary | ICD-10-CM | POA: Diagnosis not present

## 2022-02-04 DIAGNOSIS — J3089 Other allergic rhinitis: Secondary | ICD-10-CM | POA: Diagnosis not present

## 2022-02-04 DIAGNOSIS — J3081 Allergic rhinitis due to animal (cat) (dog) hair and dander: Secondary | ICD-10-CM | POA: Diagnosis not present

## 2022-02-05 ENCOUNTER — Encounter: Payer: Self-pay | Admitting: Physical Therapy

## 2022-02-05 ENCOUNTER — Ambulatory Visit: Payer: PPO | Admitting: Physical Therapy

## 2022-02-05 DIAGNOSIS — G8929 Other chronic pain: Secondary | ICD-10-CM | POA: Diagnosis not present

## 2022-02-05 DIAGNOSIS — M5459 Other low back pain: Secondary | ICD-10-CM

## 2022-02-05 DIAGNOSIS — M25562 Pain in left knee: Secondary | ICD-10-CM

## 2022-02-05 DIAGNOSIS — M542 Cervicalgia: Secondary | ICD-10-CM

## 2022-02-05 DIAGNOSIS — M25561 Pain in right knee: Secondary | ICD-10-CM | POA: Diagnosis not present

## 2022-02-05 DIAGNOSIS — M25551 Pain in right hip: Secondary | ICD-10-CM | POA: Diagnosis not present

## 2022-02-05 NOTE — Therapy (Signed)
OUTPATIENT PHYSICAL THERAPY TREATMENT   Patient Name: NEHEMIAS SAUCEDA MRN: [id number] DOB:07-06-1940, 81 y.o., male Today's Date: 02/05/2022   PT End of Session - 02/05/22 0848     Visit Number 5    Number of Visits 16    Date for PT Re-Evaluation 03/16/22    Authorization Type HTA    5 prior visits this year for PT    PT Start Time 0849    PT Stop Time 0930    PT Time Calculation (min) 41 min    Activity Tolerance Patient tolerated treatment well    Behavior During Therapy [facility] for tasks assessed/performed               Past Medical History:  Diagnosis Date   Acute medial meniscal tear    Anemia 2015   Arthritis    "middle finger right hand; right knee; neck" (02/06/2014)   Asthma    seasonal   Bladder cancer ([place]) 04/2010   "cauterized during prostate OR"   CAD (coronary artery disease)    CABG 2001   Diverticulosis    DJD (degenerative joint disease)    BACK   GERD (gastroesophageal reflux disease)    H/O inguinal hernia repair 12/2018   History of gout    Hyperlipidemia    Hypertension    IBS (irritable bowel syndrome)    Obesity    OSA (obstructive sleep apnea)    USES CPAP    Pancreatitis ~ 1980   Peripheral vascular disease ([place])    Pre-diabetes    Prostate cancer ([place]) 04/2010   Past Surgical History:  Procedure Laterality Date   ANTERIOR CERVICAL DECOMP/DISCECTOMY FUSION  05/26/2006   PLATE PLACED   BACK SURGERY     CARDIAC CATHETERIZATION  11/20/1999   CORONARY ARTERY BYPASS GRAFT  11/20/1999   SVG-RI1-RI2, SVG-OM, SVG-dRCA   CORONARY STENT INTERVENTION N/A 04/13/2019   Procedure: CORONARY STENT INTERVENTION;  Surgeon: Burnell Blanks, MD;  Location: [place] [facility];  Service: Cardiovascular;  Laterality: N/A;   HERNIA REPAIR     INGUINAL HERNIA REPAIR Left 01/02/2019   Procedure: OPEN LEFT INGUINAL HERNIA REPAIR WITH MESH;  Surgeon: Johnathan Hausen, MD;  Location: [facility];  Service: General;  Laterality: Left;   KNEE  ARTHROSCOPY Right 1992; 04/2008   LAPAROSCOPIC CHOLECYSTECTOMY  2007   LEFT HEART CATH AND CORS/GRAFTS ANGIOGRAPHY N/A 04/13/2019   Procedure: LEFT HEART CATH AND CORS/GRAFTS ANGIOGRAPHY;  Surgeon: Burnell Blanks, MD;  Location: [place] [facility];  Service: Cardiovascular;  Laterality: N/A;   NASAL SEPTUM SURGERY Right 2007   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   ROBOT ASSISTED LAPAROSCOPIC RADICAL PROSTATECTOMY  04/2010   THYROIDECTOMY, PARTIAL  1980   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   Procedure: LEFT TOTAL KNEE ARTHROPLASTY;  Surgeon: Melrose Nakayama, MD;  Location: [facility];  Service: Orthopedics;  Laterality: Left;   Patient Active Problem List   Diagnosis Date Noted   Degenerative cervical disc 01/12/2022   Greater trochanteric bursitis of right hip 02/17/2021   Hyperactive bowel sounds 01/29/2021   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 ([place]) 09/25/2020   Primary osteoarthritis of left knee 06/18/2020   Pre-operative respiratory examination 06/05/2020   Coronary artery disease involving coronary bypass graft of native heart without angina pectoris 04/07/2020   Trigger thumb, left thumb 01/24/2020   Overactive bladder 11/24/2019   Left lateral epicondylitis 09/05/2019   Degenerative arthritis of left knee 08/08/2019  Educated about COVID-19 virus infection 04/20/2019   Unstable angina ([facility])    Greater trochanteric bursitis of left hip 10/27/2018   Degenerative disc disease, lumbar 10/27/2018   Abdominal muscle strain, initial encounter 06/16/2018   Aortic atherosclerosis ([place]) 05/05/2018   Hyperglycemia 10/07/2017   Weakness of left leg 06/21/2017   DJD (degenerative joint disease)    Arthritis    Neck pain 02/08/2017   Venous insufficiency 12/29/2016   Ganglion cyst of tendon sheath of right hand 12/29/2016   Avulsion fracture of metatarsal bone of right foot 11/25/2016   Gout 11/20/2016   Obesity, Class I, BMI 30-34.9 10/03/2015    Hereditary and idiopathic peripheral neuropathy 09/25/2014   Allergic rhinitis 05/31/2014   Fatigue 11/09/2013   CKD (chronic kidney disease), stage III ([place]) 10/23/2013   Dizziness 08/23/2013   Neuropathy ([place]) 08/21/2013   RLS (restless legs syndrome) 01/03/2013   Chronic bilateral low back pain with left-sided sciatica 10/09/2011   History of bladder cancer 04/01/2010   OSTEOARTHRITIS, LOWER LEG, LEFT 10/04/2009   Generalized abdominal pain 01/31/2009   ANEMIA, IRON DEFICIENCY 11/06/2008   TINNITUS, CHRONIC, 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   Essential hypertension 02/21/2007   OSA (obstructive sleep apnea) 11/02/2006   Hyperlipidemia 10/12/2006   Major depression in full remission ([place]) 10/12/2006   Coronary atherosclerosis 10/12/2006   Asthma 10/12/2006   GERD 10/12/2006   Irritable bowel syndrome 10/12/2006    PCP: [name]   REFERRING PROVIDER: Charlann Boxer  REFERRING DIAG: Neck pain, R hip pain   THERAPY DIAG:  Cervicalgia  Other low back pain  Pain in right hip  Chronic pain of right knee  Chronic pain of left knee  Rationale for Evaluation and Treatment Rehabilitation  ONSET DATE:   SUBJECTIVE:                                                                                                                                                                                      SUBJECTIVE STATEMENT:  02/05/2022 Pt states back has been more sore in last week or so.   Eval: Pt states neck pain that started about 1 month ago, no injury to report. Most pain in High cervical region on R,  Pain comes and goes , pain with L rotation. Headaches at times from neck pain- evening.  Also has pain in :  R hip pain,  R knee-  gives way,  Had L knee replaced- painful all the time.  Back pain bilateral L>R.   Neuropathy- taking gabapentin. - very painful- feels like needles.  Exercise: has bike at clubhouse(not using), likes to walk but now too painful.   Pt has had previous  injections:  L hip 08/2021,  06/2021 L4/5    PERTINENT HISTORY: PVD, CAD, CABG, Neuropathy, CKD, cervical fusion, L knee surgery,    PAIN:  Are you having pain? Yes: NPRS scale: 4/10 Pain location: Neck  Pain description: sore  Aggravating factors: L rotation, fleixon  Relieving factors: rest  Are you having pain? Yes: NPRS scale: 5/10 Pain location: R knee Pain description: pain, weak Aggravating factors: increased activity  Relieving factors: rest  Are you having pain? Yes: NPRS scale: 2/10 Pain location: R hip Pain description: pain Aggravating factors: increased activity  Relieving factors: rest   Are you having pain? Yes: NPRS scale: 4 /10 Pain location: Back Pain description: sore, achey Aggravating factors: Standing/walking  Relieving factors: sitting/rest    PRECAUTIONS: None  WEIGHT BEARING RESTRICTIONS: No  FALLS:  Has patient fallen in last 6 months? No   PLOF: Independent  PATIENT GOALS: Decreased pain in neck, hip, back   OBJECTIVE:   DIAGNOSTIC FINDINGS:  Recent neck xray:  IMPRESSION: C5 through C7 anterior fusion again noted. Hardware intact. Diffuse degenerative change. No acute bony abnormality.   Lumbar MRI:  IMPRESSION: 1. 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 Bilateral neural foraminal narrowing at L4-L5 and L5-S1 secondary to advanced facet joint arthropathy.   2. 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   3.  Grade 1 anterolisthesis of L4, unchanged.   4. 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 Follow-up with annual abdominal sonogram is recommended.  R hip: 01/2021:  Mild joint space loss noted at the right hip. Advanced degenerative changes partially visualized in the lower lumbar spine.  L  knee: IMPRESSION: Uncomplicated left total knee prosthesis.    COGNITION: Overall cognitive status: Within functional limits for tasks assessed     POSTURE: rounded shoulders, forward head   UPPER EXTREMITY ROM:   Cervical: L rotation: 55,   R rotation: 65 , flex: mild limitation/pain,  ext: mod/sig limitation:   Shoulder: mild limitation for R>L shoulder flex, IR behind back  Knees:  painful with movement   , L: 0- 110   R: 0- 115  Hips: mild limitation for rotation bil,      UPPER EXTREMITY MMT:  Shoulders: 4+/5 ,   Knees: 4+/5,   Hips: 4/5    JOINT MOBILITY TESTING:  Hypomobile c-spine    PALPATION:  Tenderness at bil SO, R low occiput, mild tenderness in bil paraspinals,  Tightness in R UT  Tenderness/tightness in R glute musculature, mild in gr troch.   Tenderness in L lumbar/thoracic paraspinals, and central l-spine      TODAY'S TREATMENT:  DATE:    02/05/2022  Therapeutic Exercise: Aerobic: Bike L1 x 5 min;  Supine: SLR x10 bil with TA: Supine march with TA x 15;  Seated:  Standing: Sit to stand x10 ; Marching x 20; Rows Blue TB x 20;  Stretches:  Seated Hamstring stretch 30 sec x 3 bil;     Supine UT stretch 30 sec x 2 bil;  Neuromuscular Re-education:  Manual Therapy:  Manual Upper trap stretches, STM to bil cervical paraspinals and SO,  SOR,  Manual ROM for c-spine for SB and rotation.      PATIENT EDUCATION: Education details: reviewed HEP Person educated: Patient Education method: Explanation, Demonstration, Tactile cues, Verbal cues, and Handouts Education comprehension: verbalized understanding, returned demonstration, verbal cues required, tactile cues required, and needs further education    HOME EXERCISE PROGRAM: Access Code: [email or web address]    ASSESSMENT:  CLINICAL  IMPRESSION: 02/05/2022 Pt with increased pain in back today and this week. He has some frustration for ongoing pain. Discussed options for optimizing his mobility and activity level. [facility] and doing some strength training which would be beneficial for him, as well as being in the water, for decompression of back pain. Reviewed importance of continuing mobility for back pain despite continued pain. Neck still sore also, quite tender in R musculature with manual today. Plan to progress as tolerated.    Eval: Patient presents with primary complaint of increased pain in cervical region. He has soreness in R high cervical region, increased with activity and ROM. He has decreased ROM and poor postural awareness. Pt also has several other areas of pain. R hip/glute with increased pain and muscle tension. He has pain in bil knees, and low back. He also has neuropathy that effects balance. Gait/balance to be further assessed in future. Pt to benefit from skilled PT to improve deficits and pain.    OBJECTIVE IMPAIRMENTS: decreased activity tolerance, decreased balance, decreased knowledge of use of DME, decreased mobility, difficulty walking, decreased ROM, decreased strength, increased muscle spasms, impaired flexibility, improper body mechanics, and pain.   ACTIVITY LIMITATIONS: lifting, bending, standing, squatting, stairs, and locomotion level  PARTICIPATION LIMITATIONS: cleaning, driving, shopping, and community activity  PERSONAL FACTORS: 1-2 comorbidities: multiple pain locations, OA,   are also affecting patient's functional outcome.   REHAB POTENTIAL: Good  CLINICAL DECISION MAKING: Evolving/moderate complexity  EVALUATION COMPLEXITY: Moderate  GOALS: Goals reviewed with patient? Yes  SHORT TERM GOALS: Target date: 02/02/2022    Pt to be independent with HEP  Goal status: INITIAL  2.  Pt to report decreased pain in Neck to  0-3/10    Goal status: INITIAL    LONG TERM GOALS: Target date: 03/16/2022     1.Pt to be independent with final HEP, for neck, knees and back pain   Goal status: INITIAL  2.  Pt to demo ability to self correct posture at least 50 % of the time in clinic.    Goal status: INITIAL  3.  Pt to demo ability for full ROM for c-spine, without pain greater than 2/10, to improve ability for driving and ADLs.   Goal status: INITIAL  4.  Pt to report decreased pain in R hip/glute to 0-3/10 with activity, to improve ability for IADLs.   Goal status: INITIAL    PLAN: PT FREQUENCY: 2x/week  PT DURATION: 8 weeks  PLANNED INTERVENTIONS: Therapeutic exercises, Therapeutic activity, Neuromuscular re-education, Balance training, Gait training, Patient/Family  education, Self Care, Joint mobilization, Joint manipulation, Stair training, Orthotic/Fit training, DME instructions, Dry Needling, Electrical stimulation, Spinal manipulation, Spinal mobilization, Cryotherapy, Moist heat, Vasopneumatic device, Traction, Ultrasound, Ionotophoresis '4mg'$ /ml Dexamethasone, and Manual therapy  PLAN FOR NEXT SESSION:   manual for neck, R side muscle tension relief,  R glute muscles,  HEP for back, hip, knees when able.    Lyndee Hensen, PT, DPT 2:42 PM  02/05/22

## 2022-02-09 ENCOUNTER — Encounter: Payer: Self-pay | Admitting: Physical Therapy

## 2022-02-09 ENCOUNTER — Ambulatory Visit: Payer: PPO | Admitting: Physical Therapy

## 2022-02-09 DIAGNOSIS — M25561 Pain in right knee: Secondary | ICD-10-CM | POA: Diagnosis not present

## 2022-02-09 DIAGNOSIS — M25551 Pain in right hip: Secondary | ICD-10-CM | POA: Diagnosis not present

## 2022-02-09 DIAGNOSIS — G8929 Other chronic pain: Secondary | ICD-10-CM

## 2022-02-09 DIAGNOSIS — M542 Cervicalgia: Secondary | ICD-10-CM

## 2022-02-09 DIAGNOSIS — M5459 Other low back pain: Secondary | ICD-10-CM

## 2022-02-09 NOTE — Therapy (Signed)
OUTPATIENT PHYSICAL THERAPY TREATMENT   Patient Name: Joshua [name] MRN: [id number] DOB:03/05/1941, 81 y.o., male Today's Date: 02/09/2022   PT End of Session - 02/09/22 0940     Visit Number 6    Number of Visits 16    Date for PT Re-Evaluation 03/16/22    Authorization Type HTA    5 prior visits this year for PT    PT Start Time 0940    PT Stop Time 2725    PT Time Calculation (min) 38 min    Activity Tolerance Patient tolerated treatment well    Behavior During Therapy [facility] for tasks assessed/performed               Past Medical History:  Diagnosis Date   Acute medial meniscal tear    Anemia 2015   Arthritis    "middle finger right hand; right knee; neck" (02/06/2014)   Asthma    seasonal   Bladder cancer ([place]) 04/2010   "cauterized during prostate OR"   CAD (coronary artery disease)    CABG 2001   Diverticulosis    DJD (degenerative joint disease)    BACK   GERD (gastroesophageal reflux disease)    H/O inguinal hernia repair 12/2018   History of gout    Hyperlipidemia    Hypertension    IBS (irritable bowel syndrome)    Obesity    OSA (obstructive sleep apnea)    USES CPAP    Pancreatitis ~ 1980   Peripheral vascular disease ([place])    Pre-diabetes    Prostate cancer ([place]) 04/2010   Past Surgical History:  Procedure Laterality Date   ANTERIOR CERVICAL DECOMP/DISCECTOMY FUSION  05/26/2006   PLATE PLACED   BACK SURGERY     CARDIAC CATHETERIZATION  11/20/1999   CORONARY ARTERY BYPASS GRAFT  11/20/1999   SVG-RI1-RI2, SVG-OM, SVG-dRCA   CORONARY STENT INTERVENTION N/A 04/13/2019   Procedure: CORONARY STENT INTERVENTION;  Surgeon: Burnell Blanks, MD;  Location: [place] [facility];  Service: Cardiovascular;  Laterality: N/A;   HERNIA REPAIR     INGUINAL HERNIA REPAIR Left 01/02/2019   Procedure: OPEN LEFT INGUINAL HERNIA REPAIR WITH MESH;  Surgeon: Johnathan Hausen, MD;  Location: [facility];  Service: General;  Laterality: Left;   KNEE  ARTHROSCOPY Right 1992; 04/2008   LAPAROSCOPIC CHOLECYSTECTOMY  2007   LEFT HEART CATH AND CORS/GRAFTS ANGIOGRAPHY N/A 04/13/2019   Procedure: LEFT HEART CATH AND CORS/GRAFTS ANGIOGRAPHY;  Surgeon: Burnell Blanks, MD;  Location: [place] [facility];  Service: Cardiovascular;  Laterality: N/A;   NASAL SEPTUM SURGERY Right 2007   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   ROBOT ASSISTED LAPAROSCOPIC RADICAL PROSTATECTOMY  04/2010   THYROIDECTOMY, PARTIAL  1980   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   Procedure: LEFT TOTAL KNEE ARTHROPLASTY;  Surgeon: Melrose Nakayama, MD;  Location: [facility];  Service: Orthopedics;  Laterality: Left;   Patient Active Problem List   Diagnosis Date Noted   Degenerative cervical disc 01/12/2022   Greater trochanteric bursitis of right hip 02/17/2021   Hyperactive bowel sounds 01/29/2021   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 ([place]) 09/25/2020   Primary osteoarthritis of left knee 06/18/2020   Pre-operative respiratory examination 06/05/2020   Coronary artery disease involving coronary bypass graft of native heart without angina pectoris 04/07/2020   Trigger thumb, left thumb 01/24/2020   Overactive bladder 11/24/2019   Left lateral epicondylitis 09/05/2019   Degenerative arthritis of left knee 08/08/2019  Educated about COVID-19 virus infection 04/20/2019   Unstable angina ([facility])    Greater trochanteric bursitis of left hip 10/27/2018   Degenerative disc disease, lumbar 10/27/2018   Abdominal muscle strain, initial encounter 06/16/2018   Aortic atherosclerosis ([place]) 05/05/2018   Hyperglycemia 10/07/2017   Weakness of left leg 06/21/2017   DJD (degenerative joint disease)    Arthritis    Neck pain 02/08/2017   Venous insufficiency 12/29/2016   Ganglion cyst of tendon sheath of right hand 12/29/2016   Avulsion fracture of metatarsal bone of right foot 11/25/2016   Gout 11/20/2016   Obesity, Class I, BMI 30-34.9 10/03/2015    Hereditary and idiopathic peripheral neuropathy 09/25/2014   Allergic rhinitis 05/31/2014   Fatigue 11/09/2013   CKD (chronic kidney disease), stage III ([place]) 10/23/2013   Dizziness 08/23/2013   Neuropathy ([place]) 08/21/2013   RLS (restless legs syndrome) 01/03/2013   Chronic bilateral low back pain with left-sided sciatica 10/09/2011   History of bladder cancer 04/01/2010   OSTEOARTHRITIS, LOWER LEG, LEFT 10/04/2009   Generalized abdominal pain 01/31/2009   ANEMIA, IRON DEFICIENCY 11/06/2008   TINNITUS, CHRONIC, 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   Essential hypertension 02/21/2007   OSA (obstructive sleep apnea) 11/02/2006   Hyperlipidemia 10/12/2006   Major depression in full remission ([place]) 10/12/2006   Coronary atherosclerosis 10/12/2006   Asthma 10/12/2006   GERD 10/12/2006   Irritable bowel syndrome 10/12/2006    PCP: [name]   REFERRING PROVIDER: Charlann Boxer  REFERRING DIAG: Neck pain, R hip pain   THERAPY DIAG:  Cervicalgia  Other low back pain  Pain in right hip  Chronic pain of right knee  Rationale for Evaluation and Treatment Rehabilitation  ONSET DATE:   SUBJECTIVE:                                                                                                                                                                                      SUBJECTIVE STATEMENT:  02/09/2022 Pt states less soreness this week. Neck is variable, better today. Still sore on his head on R.  Eval: Pt states neck pain that started about 1 month ago, no injury to report. Most pain in High cervical region on R,  Pain comes and goes , pain with L rotation. Headaches at times from neck pain- evening.  Also has pain in :  R hip pain,  R knee-  gives way,  Had L knee replaced- painful all the time.  Back pain bilateral L>R.   Neuropathy- taking gabapentin. - very painful- feels like needles.  Exercise: has bike at clubhouse(not using), likes to walk but now too painful.   Pt has  had previous  injections:  L hip 08/2021,  06/2021 L4/5    PERTINENT HISTORY: PVD, CAD, CABG, Neuropathy, CKD, cervical fusion, L knee surgery,    PAIN:  Are you having pain? Yes: NPRS scale: 4/10 Pain location: Neck  Pain description: sore  Aggravating factors: L rotation, fleixon  Relieving factors: rest  Are you having pain? Yes: NPRS scale: 5/10 Pain location: R knee Pain description: pain, weak Aggravating factors: increased activity  Relieving factors: rest  Are you having pain? Yes: NPRS scale: 2/10 Pain location: R hip Pain description: pain Aggravating factors: increased activity  Relieving factors: rest   Are you having pain? Yes: NPRS scale: 4 /10 Pain location: Back Pain description: sore, achey Aggravating factors: Standing/walking  Relieving factors: sitting/rest    PRECAUTIONS: None  WEIGHT BEARING RESTRICTIONS: No  FALLS:  Has patient fallen in last 6 months? No   PLOF: Independent  PATIENT GOALS: Decreased pain in neck, hip, back   OBJECTIVE:   DIAGNOSTIC FINDINGS:  Recent neck xray:  IMPRESSION: C5 through C7 anterior fusion again noted. Hardware intact. Diffuse degenerative change. No acute bony abnormality.   Lumbar MRI:  IMPRESSION: 1. 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 Bilateral neural foraminal narrowing at L4-L5 and L5-S1 secondary to advanced facet joint arthropathy.   2. 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   3.  Grade 1 anterolisthesis of L4, unchanged.   4. 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 Follow-up with annual abdominal sonogram is recommended.  R hip: 01/2021:  Mild joint space loss noted at the right hip. Advanced degenerative changes partially visualized in the lower lumbar  spine.  L knee: IMPRESSION: Uncomplicated left total knee prosthesis.    COGNITION: Overall cognitive status: Within functional limits for tasks assessed     POSTURE: rounded shoulders, forward head   UPPER EXTREMITY ROM:   Cervical: L rotation: 55,   R rotation: 65 , flex: mild limitation/pain,  ext: mod/sig limitation:   Shoulder: mild limitation for R>L shoulder flex, IR behind back  Knees:  painful with movement   , L: 0- 110   R: 0- 115  Hips: mild limitation for rotation bil,      UPPER EXTREMITY MMT:  Shoulders: 4+/5 ,   Knees: 4+/5,   Hips: 4/5    JOINT MOBILITY TESTING:  Hypomobile c-spine    PALPATION:  Tenderness at bil SO, R low occiput, mild tenderness in bil paraspinals,  Tightness in R UT  Tenderness/tightness in R glute musculature, mild in gr troch.   Tenderness in L lumbar/thoracic paraspinals, and central l-spine      TODAY'S TREATMENT:  DATE:    02/09/2022  Therapeutic Exercise: Aerobic: Bike L1 x 8 min;  Supine:  cervical rotation x 10;  Seated:  Standing:  Rows Blue TB x 20; bwd shoulder rolls x 10; Bil shoulder ER x 15 AROM; Stretches:  Seated Hamstring stretch 30 sec x 3 bil;     Supine UT stretch 30 sec x 2 bil;  Neuromuscular Re-education:  Manual Therapy:  Manual Upper trap stretches, light cervical PA s, STM to bil cervical paraspinals and SO,  SOR,  Manual ROM for c-spine for SB and rotation.      PATIENT EDUCATION: Education details: reviewed HEP Person educated: Patient Education method: Explanation, Demonstration, Tactile cues, Verbal cues, and Handouts Education comprehension: verbalized understanding, returned demonstration, verbal cues required, tactile cues required, and needs further education    HOME EXERCISE PROGRAM: Access Code:  [email or web address]    ASSESSMENT:  CLINICAL IMPRESSION: 02/09/2022 Pt with continued soreness in R sub occipital region and on occiput with palpation. Reviewed importance of posture of neck and head with activity and positioning at home. Pt to benefit from continued care.    Eval: Patient presents with primary complaint of increased pain in cervical region. He has soreness in R high cervical region, increased with activity and ROM. He has decreased ROM and poor postural awareness. Pt also has several other areas of pain. R hip/glute with increased pain and muscle tension. He has pain in bil knees, and low back. He also has neuropathy that effects balance. Gait/balance to be further assessed in future. Pt to benefit from skilled PT to improve deficits and pain.    OBJECTIVE IMPAIRMENTS: decreased activity tolerance, decreased balance, decreased knowledge of use of DME, decreased mobility, difficulty walking, decreased ROM, decreased strength, increased muscle spasms, impaired flexibility, improper body mechanics, and pain.   ACTIVITY LIMITATIONS: lifting, bending, standing, squatting, stairs, and locomotion level  PARTICIPATION LIMITATIONS: cleaning, driving, shopping, and community activity  PERSONAL FACTORS: 1-2 comorbidities: multiple pain locations, OA,   are also affecting patient's functional outcome.   REHAB POTENTIAL: Good  CLINICAL DECISION MAKING: Evolving/moderate complexity  EVALUATION COMPLEXITY: Moderate  GOALS: Goals reviewed with patient? Yes  SHORT TERM GOALS: Target date: 02/02/2022    Pt to be independent with HEP  Goal status: INITIAL  2.  Pt to report decreased pain in Neck to 0-3/10    Goal status: INITIAL    LONG TERM GOALS: Target date: 03/16/2022     1.Pt to be independent with final HEP, for neck, knees and back pain   Goal status: INITIAL  2.  Pt to demo ability to self correct posture at least 50 % of the time in clinic.    Goal status:  INITIAL  3.  Pt to demo ability for full ROM for c-spine, without pain greater than 2/10, to improve ability for driving and ADLs.   Goal status: INITIAL  4.  Pt to report decreased pain in R hip/glute to 0-3/10 with activity, to improve ability for IADLs.   Goal status: INITIAL    PLAN: PT FREQUENCY: 2x/week  PT DURATION: 8 weeks  PLANNED INTERVENTIONS: Therapeutic exercises, Therapeutic activity, Neuromuscular re-education, Balance training, Gait training, Patient/Family education, Self Care, Joint mobilization, Joint manipulation, Stair training, Orthotic/Fit training, DME instructions, Dry Needling, Electrical stimulation, Spinal manipulation, Spinal mobilization, Cryotherapy, Moist heat, Vasopneumatic device, Traction, Ultrasound, Ionotophoresis '4mg'$ /ml Dexamethasone, and Manual therapy  PLAN FOR NEXT SESSION:   manual for neck, R side muscle tension relief,  R glute muscles,  HEP  for back, hip, knees when able.    Lyndee Hensen, PT, DPT 11:40 AM  02/09/22

## 2022-02-11 ENCOUNTER — Encounter: Payer: Self-pay | Admitting: Physical Therapy

## 2022-02-11 ENCOUNTER — Ambulatory Visit: Payer: PPO | Admitting: Physical Therapy

## 2022-02-11 DIAGNOSIS — M542 Cervicalgia: Secondary | ICD-10-CM

## 2022-02-11 DIAGNOSIS — M25561 Pain in right knee: Secondary | ICD-10-CM | POA: Diagnosis not present

## 2022-02-11 DIAGNOSIS — M25551 Pain in right hip: Secondary | ICD-10-CM

## 2022-02-11 DIAGNOSIS — M25562 Pain in left knee: Secondary | ICD-10-CM | POA: Diagnosis not present

## 2022-02-11 DIAGNOSIS — M5459 Other low back pain: Secondary | ICD-10-CM

## 2022-02-11 DIAGNOSIS — G8929 Other chronic pain: Secondary | ICD-10-CM

## 2022-02-11 NOTE — Therapy (Signed)
OUTPATIENT PHYSICAL THERAPY TREATMENT   Patient Name: Joshua [name] MRN: [id number] DOB:03/27/1940, 81 y.o., male Today's Date: 02/11/2022   PT End of Session - 02/11/22 0940     Visit Number 7    Number of Visits 16    Date for PT Re-Evaluation 03/16/22    Authorization Type HTA    5 prior visits this year for PT    PT Start Time 0938    PT Stop Time 1016    PT Time Calculation (min) 38 min    Activity Tolerance Patient tolerated treatment well    Behavior During Therapy [facility] for tasks assessed/performed               Past Medical History:  Diagnosis Date   Acute medial meniscal tear    Anemia 2015   Arthritis    "middle finger right hand; right knee; neck" (02/06/2014)   Asthma    seasonal   Bladder cancer ([place]) 04/2010   "cauterized during prostate OR"   CAD (coronary artery disease)    CABG 2001   Diverticulosis    DJD (degenerative joint disease)    BACK   GERD (gastroesophageal reflux disease)    H/O inguinal hernia repair 12/2018   History of gout    Hyperlipidemia    Hypertension    IBS (irritable bowel syndrome)    Obesity    OSA (obstructive sleep apnea)    USES CPAP    Pancreatitis ~ 1980   Peripheral vascular disease ([place])    Pre-diabetes    Prostate cancer ([place]) 04/2010   Past Surgical History:  Procedure Laterality Date   ANTERIOR CERVICAL DECOMP/DISCECTOMY FUSION  05/26/2006   PLATE PLACED   BACK SURGERY     CARDIAC CATHETERIZATION  11/20/1999   CORONARY ARTERY BYPASS GRAFT  11/20/1999   SVG-RI1-RI2, SVG-OM, SVG-dRCA   CORONARY STENT INTERVENTION N/A 04/13/2019   Procedure: CORONARY STENT INTERVENTION;  Surgeon: Burnell Blanks, MD;  Location: [place] [facility];  Service: Cardiovascular;  Laterality: N/A;   HERNIA REPAIR     INGUINAL HERNIA REPAIR Left 01/02/2019   Procedure: OPEN LEFT INGUINAL HERNIA REPAIR WITH MESH;  Surgeon: Johnathan Hausen, MD;  Location: [facility];  Service: General;  Laterality: Left;   KNEE  ARTHROSCOPY Right 1992; 04/2008   LAPAROSCOPIC CHOLECYSTECTOMY  2007   LEFT HEART CATH AND CORS/GRAFTS ANGIOGRAPHY N/A 04/13/2019   Procedure: LEFT HEART CATH AND CORS/GRAFTS ANGIOGRAPHY;  Surgeon: Burnell Blanks, MD;  Location: [place] [facility];  Service: Cardiovascular;  Laterality: N/A;   NASAL SEPTUM SURGERY Right 2007   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   ROBOT ASSISTED LAPAROSCOPIC RADICAL PROSTATECTOMY  04/2010   THYROIDECTOMY, PARTIAL  1980   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   Procedure: LEFT TOTAL KNEE ARTHROPLASTY;  Surgeon: Melrose Nakayama, MD;  Location: [facility];  Service: Orthopedics;  Laterality: Left;   Patient Active Problem List   Diagnosis Date Noted   Degenerative cervical disc 01/12/2022   Greater trochanteric bursitis of right hip 02/17/2021   Hyperactive bowel sounds 01/29/2021   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 ([place]) 09/25/2020   Primary osteoarthritis of left knee 06/18/2020   Pre-operative respiratory examination 06/05/2020   Coronary artery disease involving coronary bypass graft of native heart without angina pectoris 04/07/2020   Trigger thumb, left thumb 01/24/2020   Overactive bladder 11/24/2019   Left lateral epicondylitis 09/05/2019   Degenerative arthritis of left knee 08/08/2019  Educated about COVID-19 virus infection 04/20/2019   Unstable angina ([facility])    Greater trochanteric bursitis of left hip 10/27/2018   Degenerative disc disease, lumbar 10/27/2018   Abdominal muscle strain, initial encounter 06/16/2018   Aortic atherosclerosis ([place]) 05/05/2018   Hyperglycemia 10/07/2017   Weakness of left leg 06/21/2017   DJD (degenerative joint disease)    Arthritis    Neck pain 02/08/2017   Venous insufficiency 12/29/2016   Ganglion cyst of tendon sheath of right hand 12/29/2016   Avulsion fracture of metatarsal bone of right foot 11/25/2016   Gout 11/20/2016   Obesity, Class I, BMI 30-34.9 10/03/2015    Hereditary and idiopathic peripheral neuropathy 09/25/2014   Allergic rhinitis 05/31/2014   Fatigue 11/09/2013   CKD (chronic kidney disease), stage III ([place]) 10/23/2013   Dizziness 08/23/2013   Neuropathy ([place]) 08/21/2013   RLS (restless legs syndrome) 01/03/2013   Chronic bilateral low back pain with left-sided sciatica 10/09/2011   History of bladder cancer 04/01/2010   OSTEOARTHRITIS, LOWER LEG, LEFT 10/04/2009   Generalized abdominal pain 01/31/2009   ANEMIA, IRON DEFICIENCY 11/06/2008   TINNITUS, CHRONIC, 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   Essential hypertension 02/21/2007   OSA (obstructive sleep apnea) 11/02/2006   Hyperlipidemia 10/12/2006   Major depression in full remission ([place]) 10/12/2006   Coronary atherosclerosis 10/12/2006   Asthma 10/12/2006   GERD 10/12/2006   Irritable bowel syndrome 10/12/2006    PCP: [name]   REFERRING PROVIDER: Charlann Boxer  REFERRING DIAG: Neck pain, R hip pain   THERAPY DIAG:  Cervicalgia  Other low back pain  Pain in right hip  Chronic pain of right knee  Chronic pain of left knee  Rationale for Evaluation and Treatment Rehabilitation  ONSET DATE:   SUBJECTIVE:                                                                                                                                                                                      SUBJECTIVE STATEMENT:  02/11/2022 Pt is sore today. Back is sore and neck is painful today.   Eval: Pt states neck pain that started about 1 month ago, no injury to report. Most pain in High cervical region on R,  Pain comes and goes , pain with L rotation. Headaches at times from neck pain- evening.  Also has pain in :  R hip pain,  R knee-  gives way,  Had L knee replaced- painful all the time.  Back pain bilateral L>R.   Neuropathy- taking gabapentin. - very painful- feels like needles.  Exercise: has bike at clubhouse(not using), likes to walk but now too painful.   Pt has had  previous injections:  L hip 08/2021,  06/2021 L4/5    PERTINENT HISTORY: PVD, CAD, CABG, Neuropathy, CKD, cervical fusion, L knee surgery,    PAIN:  Are you having pain? Yes: NPRS scale: 4/10 Pain location: Neck  Pain description: sore  Aggravating factors: L rotation, fleixon  Relieving factors: rest  Are you having pain? Yes: NPRS scale: 5/10 Pain location: R knee Pain description: pain, weak Aggravating factors: increased activity  Relieving factors: rest  Are you having pain? Yes: NPRS scale: 2/10 Pain location: R hip Pain description: pain Aggravating factors: increased activity  Relieving factors: rest   Are you having pain? Yes: NPRS scale: 4 /10 Pain location: Back Pain description: sore, achey Aggravating factors: Standing/walking  Relieving factors: sitting/rest    PRECAUTIONS: None  WEIGHT BEARING RESTRICTIONS: No  FALLS:  Has patient fallen in last 6 months? No   PLOF: Independent  PATIENT GOALS: Decreased pain in neck, hip, back   OBJECTIVE:   DIAGNOSTIC FINDINGS:  Recent neck xray:  IMPRESSION: C5 through C7 anterior fusion again noted. Hardware intact. Diffuse degenerative change. No acute bony abnormality.   Lumbar MRI:  IMPRESSION: 1. 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 Bilateral neural foraminal narrowing at L4-L5 and L5-S1 secondary to advanced facet joint arthropathy.   2. 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   3.  Grade 1 anterolisthesis of L4, unchanged.   4. 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 Follow-up with annual abdominal sonogram is recommended.  R hip: 01/2021:  Mild joint space loss noted at the right hip. Advanced degenerative changes partially visualized in the lower lumbar  spine.  L knee: IMPRESSION: Uncomplicated left total knee prosthesis.    COGNITION: Overall cognitive status: Within functional limits for tasks assessed     POSTURE: rounded shoulders, forward head   UPPER EXTREMITY ROM:   Cervical: L rotation: 55,   R rotation: 65 , flex: mild limitation/pain,  ext: mod/sig limitation:   Shoulder: mild limitation for R>L shoulder flex, IR behind back  Knees:  painful with movement   , L: 0- 110   R: 0- 115  Hips: mild limitation for rotation bil,      UPPER EXTREMITY MMT:  Shoulders: 4+/5 ,   Knees: 4+/5,   Hips: 4/5    JOINT MOBILITY TESTING:  Hypomobile c-spine    PALPATION:  Tenderness at bil SO, R low occiput, mild tenderness in bil paraspinals,  Tightness in R UT  Tenderness/tightness in R glute musculature, mild in gr troch.   Tenderness in L lumbar/thoracic paraspinals, and central l-spine      TODAY'S TREATMENT:  DATE:    02/11/2022  Therapeutic Exercise: Aerobic: Bike L2 x 8 min;  Supine:  cervical rotation x 10;  Seated:  Standing:  Rows Blue TB x 20;  bwd shoulder rolls x 10;  Bil shoulder ER YTB x 20 ; Stretches:  Seated Hamstring stretch 30 sec x 3 bil;   LTR for back and hip rotation x 15;  Supine UT stretch 30 sec x 2 bil;  Neuromuscular Re-education:  Manual Therapy:    Manual ROM for c-spine for SB and rotation, manual cervical distraction ; long leg distraction for R hip, manual hip flex, ER and IR stretch for R hip.      PATIENT EDUCATION: Education details: reviewed HEP Person educated: Patient Education method: Explanation, Demonstration, Tactile cues, Verbal cues, and Handouts Education comprehension: verbalized understanding, returned demonstration, verbal cues required, tactile cues required, and needs further education    HOME EXERCISE  PROGRAM: Access Code: [email or web address]    ASSESSMENT:  CLINICAL IMPRESSION: 02/11/2022  Reviewed optimal positioning for sleeping as well as for resting head on pillow in seated position at times if neck is painful. Pt with good tolerance for ther ex, but still having quite a bit of variable soreness in back and neck from day to day. Pt to benefit from continued care for mobility and strengthening.    Eval: Patient presents with primary complaint of increased pain in cervical region. He has soreness in R high cervical region, increased with activity and ROM. He has decreased ROM and poor postural awareness. Pt also has several other areas of pain. R hip/glute with increased pain and muscle tension. He has pain in bil knees, and low back. He also has neuropathy that effects balance. Gait/balance to be further assessed in future. Pt to benefit from skilled PT to improve deficits and pain.    OBJECTIVE IMPAIRMENTS: decreased activity tolerance, decreased balance, decreased knowledge of use of DME, decreased mobility, difficulty walking, decreased ROM, decreased strength, increased muscle spasms, impaired flexibility, improper body mechanics, and pain.   ACTIVITY LIMITATIONS: lifting, bending, standing, squatting, stairs, and locomotion level  PARTICIPATION LIMITATIONS: cleaning, driving, shopping, and community activity  PERSONAL FACTORS: 1-2 comorbidities: multiple pain locations, OA,   are also affecting patient's functional outcome.   REHAB POTENTIAL: Good  CLINICAL DECISION MAKING: Evolving/moderate complexity  EVALUATION COMPLEXITY: Moderate  GOALS: Goals reviewed with patient? Yes  SHORT TERM GOALS: Target date: 02/02/2022    Pt to be independent with HEP  Goal status: MET  2.  Pt to report decreased pain in Neck to 0-3/10    Goal status: IN PROGRESS    LONG TERM GOALS: Target date: 03/16/2022     1.Pt to be independent with final HEP, for neck, knees and back pain   Goal  status: INITIAL  2.  Pt to demo ability to self correct posture at least 50 % of the time in clinic.    Goal status: INITIAL  3.  Pt to demo ability for full ROM for c-spine, without pain greater than 2/10, to improve ability for driving and ADLs.   Goal status: INITIAL  4.  Pt to report decreased pain in R hip/glute to 0-3/10 with activity, to improve ability for IADLs.   Goal status: INITIAL    PLAN: PT FREQUENCY: 2x/week  PT DURATION: 8 weeks  PLANNED INTERVENTIONS: Therapeutic exercises, Therapeutic activity, Neuromuscular re-education, Balance training, Gait training, Patient/Family education, Self Care, Joint mobilization, Joint manipulation, Stair training, Orthotic/Fit training, DME instructions, Dry Needling, Electrical stimulation,  Spinal manipulation, Spinal mobilization, Cryotherapy, Moist heat, Vasopneumatic device, Traction, Ultrasound, Ionotophoresis 75m/ml Dexamethasone, and Manual therapy  PLAN FOR NEXT SESSION:   manual for neck, R side muscle tension relief,  R glute muscles,  HEP for back, hip, knees when able.    LLyndee Hensen PT, DPT 11:09 AM  02/11/22

## 2022-02-13 ENCOUNTER — Other Ambulatory Visit: Payer: Self-pay | Admitting: Internal Medicine

## 2022-02-13 DIAGNOSIS — E785 Hyperlipidemia, unspecified: Secondary | ICD-10-CM

## 2022-02-13 DIAGNOSIS — G4733 Obstructive sleep apnea (adult) (pediatric): Secondary | ICD-10-CM | POA: Diagnosis not present

## 2022-02-13 DIAGNOSIS — I2510 Atherosclerotic heart disease of native coronary artery without angina pectoris: Secondary | ICD-10-CM

## 2022-02-16 ENCOUNTER — Encounter: Payer: Self-pay | Admitting: Physical Therapy

## 2022-02-16 ENCOUNTER — Ambulatory Visit: Payer: PPO | Admitting: Physical Therapy

## 2022-02-16 DIAGNOSIS — M542 Cervicalgia: Secondary | ICD-10-CM

## 2022-02-16 DIAGNOSIS — G8929 Other chronic pain: Secondary | ICD-10-CM | POA: Diagnosis not present

## 2022-02-16 DIAGNOSIS — M25551 Pain in right hip: Secondary | ICD-10-CM | POA: Diagnosis not present

## 2022-02-16 DIAGNOSIS — M25561 Pain in right knee: Secondary | ICD-10-CM | POA: Diagnosis not present

## 2022-02-16 DIAGNOSIS — M5459 Other low back pain: Secondary | ICD-10-CM | POA: Diagnosis not present

## 2022-02-16 NOTE — Therapy (Signed)
OUTPATIENT PHYSICAL THERAPY TREATMENT   Patient Name: Joshua [name] MRN: [id number] DOB:05/21/1940, 81 y.o., male Today's Date: 02/16/2022   PT End of Session - 02/18/22 0926     Visit Number 8    Number of Visits 16    Date for PT Re-Evaluation 03/16/22    Authorization Type HTA    5 prior visits this year for PT    PT Start Time 0938    PT Stop Time 1016    PT Time Calculation (min) 38 min    Activity Tolerance Patient tolerated treatment well    Behavior During Therapy [facility] for tasks assessed/performed                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"   CAD (coronary artery disease)    CABG 2001   Diverticulosis    DJD (degenerative joint disease)    BACK   GERD (gastroesophageal reflux disease)    H/O inguinal hernia repair 12/2018   History of gout    Hyperlipidemia    Hypertension    IBS (irritable bowel syndrome)    Obesity    OSA (obstructive sleep apnea)    USES CPAP    Pancreatitis ~ 1980   Peripheral vascular disease (HCC)    Pre-diabetes    Prostate cancer (HCC) 04/2010   Past Surgical History:  Procedure Laterality Date   ANTERIOR CERVICAL DECOMP/DISCECTOMY FUSION  05/26/2006   PLATE PLACED   BACK SURGERY     CARDIAC CATHETERIZATION  11/20/1999   CORONARY ARTERY BYPASS GRAFT  11/20/1999   SVG-RI1-RI2, SVG-OM, SVG-dRCA   CORONARY STENT INTERVENTION N/A 04/13/2019   Procedure: CORONARY STENT INTERVENTION;  Surgeon: Kathleene Hazel, MD;  Location: [facility];  Service: Cardiovascular;  Laterality: N/A;   HERNIA REPAIR     INGUINAL HERNIA REPAIR Left 01/02/2019   Procedure: OPEN LEFT INGUINAL HERNIA REPAIR WITH MESH;  Surgeon: Luretha Murphy, MD;  Location: [facility];  Service: General;  Laterality: Left;   KNEE  ARTHROSCOPY Right 1992; 04/2008   LAPAROSCOPIC CHOLECYSTECTOMY  2007   LEFT HEART CATH AND CORS/GRAFTS ANGIOGRAPHY N/A 04/13/2019   Procedure: LEFT HEART CATH AND CORS/GRAFTS ANGIOGRAPHY;  Surgeon: Kathleene Hazel, MD;  Location: [facility];  Service: Cardiovascular;  Laterality: N/A;   NASAL SEPTUM SURGERY Right 2007   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   ROBOT ASSISTED LAPAROSCOPIC RADICAL PROSTATECTOMY  04/2010   THYROIDECTOMY, PARTIAL  1980   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   Procedure: LEFT TOTAL KNEE ARTHROPLASTY;  Surgeon: Marcene Corning, MD;  Location: [facility];  Service: Orthopedics;  Laterality: Left;   Patient Active Problem List   Diagnosis Date Noted   Degenerative cervical disc 01/12/2022   Greater trochanteric bursitis of right hip 02/17/2021   Hyperactive bowel sounds 01/29/2021   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) 09/25/2020   Primary osteoarthritis of left knee 06/18/2020   Pre-operative respiratory examination 06/05/2020   Coronary artery disease involving coronary bypass graft of native heart without angina pectoris 04/07/2020   Trigger thumb, left thumb 01/24/2020   Overactive bladder 11/24/2019   Left lateral epicondylitis 09/05/2019   Degenerative arthritis of left knee 08/08/2019  Educated about COVID-19 virus infection 04/20/2019   Unstable angina (HCC)    Greater trochanteric bursitis of left hip 10/27/2018   Degenerative disc disease, lumbar 10/27/2018   Abdominal muscle strain, initial encounter 06/16/2018   Aortic atherosclerosis (HCC) 05/05/2018   Hyperglycemia 10/07/2017   Weakness of left leg 06/21/2017   DJD (degenerative joint disease)    Arthritis    Neck pain 02/08/2017   Venous insufficiency 12/29/2016   Ganglion cyst of tendon sheath of right hand 12/29/2016   Avulsion fracture of metatarsal bone of right foot 11/25/2016   Gout 11/20/2016   Obesity, Class I, BMI 30-34.9 10/03/2015    Hereditary and idiopathic peripheral neuropathy 09/25/2014   Allergic rhinitis 05/31/2014   Fatigue 11/09/2013   CKD (chronic kidney disease), stage III (HCC) 10/23/2013   Dizziness 08/23/2013   Neuropathy (HCC) 08/21/2013   RLS (restless legs syndrome) 01/03/2013   Chronic bilateral low back pain with left-sided sciatica 10/09/2011   History of bladder cancer 04/01/2010   OSTEOARTHRITIS, LOWER LEG, LEFT 10/04/2009   Generalized abdominal pain 01/31/2009   ANEMIA, IRON DEFICIENCY 11/06/2008   TINNITUS, CHRONIC, 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   Essential hypertension 02/21/2007   OSA (obstructive sleep apnea) 11/02/2006   Hyperlipidemia 10/12/2006   Major depression in full remission (HCC) 10/12/2006   Coronary atherosclerosis 10/12/2006   Asthma 10/12/2006   GERD 10/12/2006   Irritable bowel syndrome 10/12/2006    PCP: Tana Conch   REFERRING PROVIDER: Terrilee Files  REFERRING DIAG: Neck pain, R hip pain   THERAPY DIAG:  Cervicalgia  Other low back pain  Pain in right hip  Chronic pain of right knee  Rationale for Evaluation and Treatment Rehabilitation  ONSET DATE:   SUBJECTIVE:                                                                                                                                                                                      SUBJECTIVE STATEMENT:  02/16/2022 Neck less sore today.  Back sore today.   Eval: Pt states neck pain that started about 1 month ago, no injury to report. Most pain in High cervical region on R,  Pain comes and goes , pain with L rotation. Headaches at times from neck pain- evening.  Also has pain in :  R hip pain,  R knee-  gives way,  Had L knee replaced- painful all the time.  Back pain bilateral L>R.   Neuropathy- taking gabapentin. - very painful- feels like needles.  Exercise: has bike at clubhouse(not using), likes to walk but now too painful.   Pt has had previous injections:  L hip 08/2021,  06/2021  L4/5  PERTINENT HISTORY: PVD, CAD, CABG, Neuropathy, CKD, cervical fusion, L knee surgery,    PAIN:  Are you having pain? Yes: NPRS scale: 4/10 Pain location: Neck  Pain description: sore  Aggravating factors: L rotation, fleixon  Relieving factors: rest  Are you having pain? Yes: NPRS scale: 5/10 Pain location: R knee Pain description: pain, weak Aggravating factors: increased activity  Relieving factors: rest  Are you having pain? Yes: NPRS scale: 2/10 Pain location: R hip Pain description: pain Aggravating factors: increased activity  Relieving factors: rest   Are you having pain? Yes: NPRS scale: 4 /10 Pain location: Back Pain description: sore, achey Aggravating factors: Standing/walking  Relieving factors: sitting/rest    PRECAUTIONS: None  WEIGHT BEARING RESTRICTIONS: No  FALLS:  Has patient fallen in last 6 months? No   PLOF: Independent  PATIENT GOALS: Decreased pain in neck, hip, back   OBJECTIVE:   DIAGNOSTIC FINDINGS:  Recent neck xray:  IMPRESSION: C5 through C7 anterior fusion again noted. Hardware intact. Diffuse degenerative change. No acute bony abnormality.   Lumbar MRI:  IMPRESSION: 1. 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 Bilateral neural foraminal narrowing at L4-L5 and L5-S1 secondary to advanced facet joint arthropathy.   2. 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   3.  Grade 1 anterolisthesis of L4, unchanged.   4. 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 Follow-up with annual abdominal sonogram is recommended.  R hip: 01/2021:  Mild joint space loss noted at the right hip. Advanced degenerative changes partially visualized in the lower lumbar spine.  L knee: IMPRESSION: Uncomplicated left  total knee prosthesis.    COGNITION: Overall cognitive status: Within functional limits for tasks assessed     POSTURE: rounded shoulders, forward head   UPPER EXTREMITY ROM:   Cervical: L rotation: 55,   R rotation: 65 , flex: mild limitation/pain,  ext: mod/sig limitation:   Shoulder: mild limitation for R>L shoulder flex, IR behind back  Knees:  painful with movement   , L: 0- 110   R: 0- 115  Hips: mild limitation for rotation bil,      UPPER EXTREMITY MMT:  Shoulders: 4+/5 ,   Knees: 4+/5,   Hips: 4/5    JOINT MOBILITY TESTING:  Hypomobile c-spine    PALPATION:  Tenderness at bil SO, R low occiput, mild tenderness in bil paraspinals,  Tightness in R UT  Tenderness/tightness in R glute musculature, mild in gr troch.   Tenderness in L lumbar/thoracic paraspinals, and central l-spine      TODAY'S TREATMENT:  DATE:    02/16/2022  Therapeutic Exercise: Aerobic: Bike L2 x 8 min;  Supine:  cervical rotation x 10; TA contraction x 20,  Supine marching x 20 with TA;  Seated:   Standing:  Rows Blue TB x 20;     Bil shoulder ER YTB x 20 ; Stretches:  Seated Hamstring stretch 30 sec x 3 bil;   LTR for back and hip rotation x 15;  Neuromuscular Re-education:  Manual Therapy:   Manual ROM for c-spine for SB and rotation,  SOR; long leg distraction for R hip, manual hip flex, ER and IR stretch for R hip.      PATIENT EDUCATION: Education details: reviewed HEP Person educated: Patient Education method: Explanation, Demonstration, Tactile cues, Verbal cues, and Handouts Education comprehension: verbalized understanding, returned demonstration, verbal cues required, tactile cues required, and needs further education    HOME EXERCISE PROGRAM: Access Code: [email or web address]    ASSESSMENT:  CLINICAL  IMPRESSION: 02/16/2022 Pt with good tolerance for ther ex, but still having quite a bit of variable soreness in back and neck from day to day. Pt to benefit from continued care for mobility and strengthening. Pt doing better with compliance with HEP and has been riding bike daily.   Eval: Patient presents with primary complaint of increased pain in cervical region. He has soreness in R high cervical region, increased with activity and ROM. He has decreased ROM and poor postural awareness. Pt also has several other areas of pain. R hip/glute with increased pain and muscle tension. He has pain in bil knees, and low back. He also has neuropathy that effects balance. Gait/balance to be further assessed in future. Pt to benefit from skilled PT to improve deficits and pain.    OBJECTIVE IMPAIRMENTS: decreased activity tolerance, decreased balance, decreased knowledge of use of DME, decreased mobility, difficulty walking, decreased ROM, decreased strength, increased muscle spasms, impaired flexibility, improper body mechanics, and pain.   ACTIVITY LIMITATIONS: lifting, bending, standing, squatting, stairs, and locomotion level  PARTICIPATION LIMITATIONS: cleaning, driving, shopping, and community activity  PERSONAL FACTORS: 1-2 comorbidities: multiple pain locations, OA,   are also affecting patient's functional outcome.   REHAB POTENTIAL: Good  CLINICAL DECISION MAKING: Evolving/moderate complexity  EVALUATION COMPLEXITY: Moderate  GOALS: Goals reviewed with patient? Yes  SHORT TERM GOALS: Target date: 02/02/2022    Pt to be independent with HEP  Goal status: MET  2.  Pt to report decreased pain in Neck to 0-3/10    Goal status: IN PROGRESS    LONG TERM GOALS: Target date: 03/16/2022     1.Pt to be independent with final HEP, for neck, knees and back pain   Goal status: INITIAL  2.  Pt to demo ability to self correct posture at least 50 % of the time in clinic.    Goal status:  INITIAL  3.  Pt to demo ability for full ROM for c-spine, without pain greater than 2/10, to improve ability for driving and ADLs.   Goal status: INITIAL  4.  Pt to report decreased pain in R hip/glute to 0-3/10 with activity, to improve ability for IADLs.   Goal status: INITIAL    PLAN: PT FREQUENCY: 2x/week  PT DURATION: 8 weeks  PLANNED INTERVENTIONS: Therapeutic exercises, Therapeutic activity, Neuromuscular re-education, Balance training, Gait training, Patient/Family education, Self Care, Joint mobilization, Joint manipulation, Stair training, Orthotic/Fit training, DME instructions, Dry Needling, Electrical stimulation, Spinal manipulation, Spinal mobilization, Cryotherapy, Moist heat, Vasopneumatic device, Traction, Ultrasound, Ionotophoresis 4mg /ml Dexamethasone,  and Manual therapy  PLAN FOR NEXT SESSION:   manual for neck, R side muscle tension relief,  R glute muscles,  HEP for back, hip, knees when able.    Sedalia Muta, PT, DPT 9:27 AM  02/18/22

## 2022-02-18 ENCOUNTER — Encounter: Payer: Self-pay | Admitting: Physical Therapy

## 2022-02-18 DIAGNOSIS — J3081 Allergic rhinitis due to animal (cat) (dog) hair and dander: Secondary | ICD-10-CM | POA: Diagnosis not present

## 2022-02-18 DIAGNOSIS — J3089 Other allergic rhinitis: Secondary | ICD-10-CM | POA: Diagnosis not present

## 2022-02-18 DIAGNOSIS — J301 Allergic rhinitis due to pollen: Secondary | ICD-10-CM | POA: Diagnosis not present

## 2022-02-19 ENCOUNTER — Encounter: Payer: PPO | Admitting: Physical Therapy

## 2022-02-20 ENCOUNTER — Encounter: Payer: PPO | Admitting: Physical Therapy

## 2022-02-20 NOTE — Progress Notes (Signed)
Chronic Care Management Pharmacy Note Summary: FU phone call.  Patient has stopped pantoprazole due some diarrhea.  Says symptoms have improved since he d/c the medication.  He is not having any rebound GERD.  LDL has improved and now at goal since I last saw him.  Recommendations: Monitor GERD symptoms, let us know if they change  FU 6 months  02/24/2022 Name:  STEVENSON [name] MRN:  [id number] DOB:  05-21-1940  Subjective: Joshua [name] is an 81 y.o. year old male who is a primary patient of Hunter, Brayton Mars, MD.  The CCM team was consulted for assistance with disease management and care coordination needs.    Engaged with patient by telephone for follow up visit in response to provider referral for pharmacy case management and/or care coordination services.   Consent to Services:  The patient was given information about Chronic Care Management services, agreed to services, and gave verbal consent prior to initiation of services.  Please see initial visit note for detailed documentation.   Patient Care Team: [name], MD as PCP - General (Family Medicine) [name], MD as PCP - Cardiology (Cardiology) Gerda Diss, DO as Consulting Physician (Sports Medicine) Cameron Sprang, MD as Consulting Physician (Neurology) Chesley Mires, MD as Consulting Physician (Pulmonary Disease) Lyndal Pulley, DO as Consulting Physician (Sports Medicine) Tiajuana Amass, MD as Consulting Physician (Allergy and Immunology) Edythe Clarity, [facility] (Pharmacist)  Objective: Lab Results  Component Value Date   CREATININE 1.26 12/23/2021   CREATININE 1.27 05/13/2021   CREATININE 1.21 02/06/2021   GFR 54.21 (L) 05/13/2021   GFR 57.56 (L) 02/06/2021   GFRNONAA 52 (L) 08/11/2020   GFRNONAA 49 (L) 06/22/2020   HGBA1C 5.7 05/13/2021   HGBA1C 5.7 02/06/2021   HGBA1C 6.2 (H) 06/11/2020  Last diabetic Eye exam: No results found for: "HMDIABEYEEXA"  Last diabetic Foot exam: No  results found for: "HMDIABFOOTEX"  Lab Results  Component Value Date   CHOL 129 05/13/2021   CHOL 161 10/22/2020   TRIG 149.0 05/13/2021   TRIG 147 10/22/2020   HDL 51.60 05/13/2021   HDL 50 10/22/2020   CHOLHDL 3 05/13/2021   CHOLHDL 3.2 10/22/2020   VLDL 29.8 05/13/2021   VLDL 31.6 11/03/2018   LDLCALC 48 05/13/2021   LDLCALC 85 10/22/2020   LDLDIRECT 115.0 06/04/2016   LDLDIRECT 81.1 07/04/2009      Latest Ref Rng & Units 05/13/2021   11:01 AM 02/06/2021    9:26 AM 09/25/2020   10:38 AM  Hepatic Function  Total Protein 6.0 - 8.3 g/dL 7.2  6.8  7.1   Albumin 3.5 - 5.2 g/dL 4.3  4.1  4.3   AST 0 - 37 U/L _0 ALT 0 - 53 U/L _1 Alk Phosphatase 39 - 117 U/L 59  60  56   Total Bilirubin 0.2 - 1.2 mg/dL 0.9  0.7  0.7    Lab Results  Component Value Date/Time   TSH 0.60 07/30/2020 10:57 AM   TSH 0.99 08/25/2019 01:52 PM   FREET4 0.94 07/30/2020 10:57 AM   FREET4 0.83 09/13/2014 04:15 PM      Latest Ref Rng & Units 12/23/2021    3:52 PM 05/13/2021   11:01 AM 02/06/2021    9:26 AM  CBC  WBC 3.4 - 10.8 x10E3/uL 7.6  7.2  6.3   Hemoglobin 13.0 - 17.7 g/dL 14.8  14.7  14.2  Hematocrit 37.5 - 51.0 % 42.8  42.7  41.9   Platelets 150 - 450 x10E3/uL 274  246.0  258.0    Lab Results  Component Value Date/Time   VD25OH 50.01 09/25/2020 10:38 AM   VD25OH 62 02/23/2020 09:33 AM   VD25OH 48.85 08/25/2019 01:52 PM    Clinical ASCVD: No  The ASCVD Risk score (Arnett DK, et al., 2019) failed to calculate for the following reasons:   The valid total cholesterol range is 130 to 320 mg/dL    Social History   Tobacco Use  Smoking Status Former   Packs/day: 1.00   Years: 35.00   Total pack years: 35.00   Types: Cigarettes   Quit date: 06/16/1992   Years since quitting: 29.7  Smokeless Tobacco Never   BP Readings from Last 3 Encounters:  01/23/22 120/68  01/21/22 124/71  01/12/22 106/72   Pulse Readings from Last 3 Encounters:  01/23/22 84  01/21/22 74   01/12/22 70   Wt Readings from Last 3 Encounters:  01/23/22 211 lb 3.2 oz (95.8 kg)  01/21/22 212 lb 12.8 oz (96.5 kg)  01/12/22 213 lb (96.6 kg)    Assessment: Review of patient past medical history, allergies, medications, health status, including review of consultants reports, laboratory and other test data, was performed as part of comprehensive evaluation and provision of chronic care management services.   SDOH:  (Social Determinants of Health) assessments and interventions performed: No, done within the year Financial Resource Strain: Low Risk  (05/22/2021)   Overall Financial Resource Strain (CARDIA)    Difficulty of Paying Living Expenses: Not hard at all   Food Insecurity: No Food Insecurity (05/22/2021)   Hunger Vital Sign    Worried About Running Out of Food in the Last Year: Never true    Ran Out of Food in the Last Year: Never true    SDOH Interventions    [place] Office Visit from 10/01/2021 in [place] Visit from 09/18/2021 in [place] Visit from 08/21/2020 in [place] Visit from 09/04/2019 in [place] Visit from 08/25/2019 in [place] Visit from 11/02/2018 in [place]  SDOH Interventions        Depression Interventions/Treatment  Medication Counseling Medication, Counseling Counseling Counseling Counseling      CCM Care Plan Allergies  Allergen Reactions   Benadryl [Diphenhydramine] Other (See Comments)    Pt told not to take because of bypass surgery   Bromfed Nausea And Vomiting   Clarithromycin Other (See Comments)     gastritis   Codeine Other (See Comments)    Trouble breathing   Cymbalta [Duloxetine Hcl]     Abdominal pain and later chest pain as well as worsening fatigue    Doxycycline Hyclate Nausea And Vomiting   Lexapro [Escitalopram]     Dizziness    Modafinil Other (See Comments)     anxiety-nervousness   Oxycodone-Acetaminophen Itching   Promethazine Hcl Other (See Comments)     fainting   Quinolones Nausea Only    Cipro "felt real bad"   Repatha [Evolocumab]     Weak, worn out   Telithromycin Nausea And Vomiting   Tramadol Nausea Only   Hydrocodone-Acetaminophen Rash   Statins Other (See Comments)    Leg cramps and makes patient feel bad. Per pt tried multiple in years past   Sulfonamide Derivatives Rash   Medications Reviewed Today  Reviewed by Lyndee Hensen, PT (Physical Therapist) on 02/18/22 at [phone]  Med List Status: <None>   Medication Order Taking? Sig Documenting Provider Last Dose Status Informant  acetaminophen (TYLENOL) 500 MG tablet [id number] No Take 1,000 mg by mouth every 6 (six) hours as needed for moderate pain or headache. [provider] Taking Active Spouse/Significant Other  albuterol (PROAIR HFA) 108 (90 Base) MCG/ACT inhaler [id number] No Inhale 1-2 puffs into the lungs every 6 (six) hours as needed for wheezing or shortness of breath. Jeanie Sewer, NP Taking Active   Alirocumab (PRALUENT) 150 MG/ML SOAJ [id number]  INJECT 1 DOSE INTO THE SKIN EVERY 14 (FOURTEEN) DAYS. Pixie Casino, MD  Active   amLODipine (NORVASC) 5 MG tablet [id number] No TAKE 1 TABLET (5 MG TOTAL) BY MOUTH DAILY. Pixie Casino, MD Taking Active   aspirin EC 81 MG tablet [id number] No Take 1 tablet (81 mg total) by mouth 2 (two) times daily after a meal. Loni Dolly, PA-C Taking Active Spouse/Significant Other           Med Note Gwinda [place], Con Memos D   Fri Nov 01, 2020  9:30 AM) Patient is taking only 1 tab/day  cholecalciferol (VITAMIN D3) 25 MCG (1000 UNIT) tablet [id number] No Take 1,000 Units by mouth daily. [provider] Taking Active Spouse/Significant Other  COVID-19 mRNA vaccine 2023-2024 (COMIRNATY) syringe [id number]  Inject into the muscle. Carlyle Basques, MD  Active   Cyanocobalamin (B-12) 2500 MCG  TABS [id number] No Take 1,250 mcg by mouth daily. [provider] Taking Active Spouse/Significant Other  diazepam (VALIUM) 5 MG tablet [id number] No TAKE 1 TABLET (5 MG TOTAL) BY MOUTH AT BEDTIME AS NEEDED (SLEEP). [name], MD Taking Active   EPINEPHrine 0.3 mg/0.3 mL IJ SOAJ injection [id number] No Inject 0.3 mg into the muscle as needed for anaphylaxis. for allergic reaction  Patient not taking: Reported on 01/23/2022   [provider] Not Taking Active            Med Note (Kenta Laster, SOPHIA A   Wed Jan 29, 2021  9:10 AM) On hand  ezetimibe (ZETIA) 10 MG tablet [id number] No TAKE 1 TABLET BY MOUTH EVERY DAY Hilty, Nadean Corwin, MD Taking Active   FLOVENT HFA 110 MCG/ACT inhaler [id number] No Inhale 1 puff into the lungs 2 (two) times daily.  [provider] Taking Active Spouse/Significant Other           Med Note Nat Christen   Fri Dec 23, 2018  1:29 PM)    fluticasone (FLONASE) 50 MCG/ACT nasal spray [id number] No Place 1 spray into both nostrils 2 (two) times daily.  Ricard Dillon, MD Taking Active Spouse/Significant Other  folic acid (FOLVITE) 073 MCG tablet [id number] No Take 800 mcg by mouth daily.  [provider] Taking Active Spouse/Significant Other           Med Note Gwinda [place], Magdalene Patricia   Fri Nov 01, 2020  9:34 AM) Pt is taking 800 mcg unable to find 400 mcg.  gabapentin (NEURONTIN) 300 MG capsule [id number] No Take 1 cap in AM, 1 cap at noon, 3 caps at bedtime Cameron Sprang, MD Taking Active   GEMTESA 75 MG TABS [id number] No TAKE 1 TABLET BY MOUTH DAILY. [name], MD Taking Active   Glucos-Chond-Hyal Ac-Ca Fructo (MOVE FREE JOINT HEALTH ADVANCE PO) [id number] No Take 1 tablet by mouth daily. [provider] Taking Active Spouse/Significant Other  hydrochlorothiazide (HYDRODIURIL) 25 MG tablet [id number]  No TAKE 1 TABLET BY MOUTH EVERY DAY [name], MD Taking Active   ipratropium (ATROVENT HFA) 17 MCG/ACT inhaler [id number] No  Inhale 2 puffs into the lungs 3 (three) times daily. Jeanie Sewer, NP Taking Active   ketoconazole (NIZORAL) 2 % cream [id number] No Apply 1 application topically See admin instructions. Twice a day every other day [provider] Taking Active Spouse/Significant Other  Miconazole Nitrate (LOTRIMIN AF DEODORANT POWDER) 2 % AERP [id number] No Apply 1 spray topically daily as needed (jock itch). [provider] Taking Active Spouse/Significant Other  nitroGLYCERIN (NITROSTAT) 0.4 MG SL tablet [id number] No PLACE 1 TABLET UNDER THE TONGUE EVERY 5 (FIVE) MINUTES AS NEEDED. [name], MD Taking Active            Med Note (Cathe Bilger, SOPHIA A   Wed Jan 29, 2021  9:11 AM) On hand  pantoprazole (PROTONIX) 40 MG tablet [id number] No TAKE 1 TABLET BY MOUTH EVERY DAY Hilty, Nadean Corwin, MD Taking Active   polyethylene glycol powder (GLYCOLAX/MIRALAX) powder [id number] No Take 17 g by mouth daily. Tanna Furry, MD Taking Active   valsartan (DIOVAN) 320 MG tablet [id number] No TAKE 1 TABLET BY MOUTH EVERY DAY [name], MD Taking Active   Wheat Dextrin [facility]) POWD [id number] No Take 1 Dose by mouth daily. 1 dose = 2 teaspoons [provider] Taking Active Spouse/Significant Other           Patient Active Problem List   Diagnosis Date Noted   Degenerative cervical disc 01/12/2022   Greater trochanteric bursitis of right hip 02/17/2021   Hyperactive bowel sounds 01/29/2021   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 ([place]) 09/25/2020   Primary osteoarthritis of left knee 06/18/2020   Pre-operative respiratory examination 06/05/2020   Coronary artery disease involving coronary bypass graft of native heart without angina pectoris 04/07/2020   Trigger thumb, left thumb 01/24/2020   Overactive bladder 11/24/2019   Left lateral epicondylitis 09/05/2019   Degenerative arthritis of left knee 08/08/2019   Educated about COVID-19 virus infection  04/20/2019   Unstable angina (HCC)    Greater trochanteric bursitis of left hip 10/27/2018   Degenerative disc disease, lumbar 10/27/2018   Abdominal muscle strain, initial encounter 06/16/2018   Aortic atherosclerosis ([place]) 05/05/2018   Hyperglycemia 10/07/2017   Weakness of left leg 06/21/2017   DJD (degenerative joint disease)    Arthritis    Neck pain 02/08/2017   Venous insufficiency 12/29/2016   Ganglion cyst of tendon sheath of right hand 12/29/2016   Avulsion fracture of metatarsal bone of right foot 11/25/2016   Gout 11/20/2016   Obesity, Class I, BMI 30-34.9 10/03/2015   Hereditary and idiopathic peripheral neuropathy 09/25/2014   Allergic rhinitis 05/31/2014   Fatigue 11/09/2013   CKD (chronic kidney disease), stage III ([place]) 10/23/2013   Dizziness 08/23/2013   Neuropathy ([place]) 08/21/2013   RLS (restless legs syndrome) 01/03/2013   Chronic bilateral low back pain with left-sided sciatica 10/09/2011   History of bladder cancer 04/01/2010   OSTEOARTHRITIS, LOWER LEG, LEFT 10/04/2009   Generalized abdominal pain 01/31/2009   ANEMIA, IRON DEFICIENCY 11/06/2008   TINNITUS, CHRONIC, 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   Essential hypertension 02/21/2007   OSA (obstructive sleep apnea) 11/02/2006   Hyperlipidemia 10/12/2006   Major depression in full remission ([place]) 10/12/2006   Coronary atherosclerosis 10/12/2006   Asthma 10/12/2006   GERD 10/12/2006   Irritable bowel syndrome 10/12/2006   Immunization History  Administered Date(s) Administered  COVID-19, mRNA, vaccine(Comirnaty)12 years and older 01/29/2022   Fluad Quad(high Dose 65+) 11/21/2018, 12/11/2020, 12/18/2021   Influenza Split 01/08/2012, 11/12/2014   Influenza Whole 12/08/2007, 12/26/2008, 12/22/2010   Influenza, High Dose Seasonal PF 11/16/2015, 01/02/2016, 01/01/2017, 01/06/2018, 02/01/2019, 11/24/2019, 01/23/2020, 01/21/2021   Influenza,inj,Quad PF,6+ Mos 11/30/2012   Influenza-Unspecified 01/12/2014, 12/21/2017    PFIZER Comirnaty(Gray Top)Covid-19 Tri-Sucrose Vaccine 07/29/2020   PFIZER(Purple Top)SARS-COV-2 Vaccination 04/28/2019, 05/23/2019, 12/21/2019, 01/23/2020   PNEUMOCOCCAL CONJUGATE-20 09/26/2021   Pfizer Covid-19 Vaccine Bivalent Booster 75yr & up 12/11/2020   Pneumococcal Conjugate-13 05/31/2014   Pneumococcal Polysaccharide-23 06/04/2006, 07/15/2012, 01/02/2016, 12/31/2016, 01/23/2020, 01/21/2021   Td 04/23/2005, 05/13/2021   Tdap 02/18/2016   Zoster Recombinat (Shingrix) 01/31/2018, 04/02/2018   Zoster, Live 01/08/2012   Conditions to be addressed/monitored: CAD, HTN, HLD and CKD Stage III  Care Plan : [place] Updates made by DEdythe Clarity RPH since 02/24/2022 12:00 AM     Problem: CAD, HTN, HLD and CKD Stage III   Priority: High     Long-Range Goal: Disease Management   Start Date: 06/10/2020  Expected End Date: 06/10/2021  Recent Progress: On track  Priority: High  Note:   Current Barriers:  Unable to independently afford treatment regimen Suboptimal therapeutic regimen for hyperlipidemia   Pharmacist Clinical Goal(s):  Over the next 365 days, patient will verbalize ability to afford treatment regimen achieve adherence to monitoring guidelines and medication adherence to achieve therapeutic efficacy contact provider office for questions/concerns as evidenced notation of same in electronic health record through collaboration with PharmD and provider.   Interventions: 1:1 collaboration with HMarin Olp MD regarding development and update of comprehensive plan of care as evidenced by provider attestation and co-signature Inter-disciplinary care team collaboration (see longitudinal plan of care) Comprehensive medication review performed; medication list updated in electronic medical record  Hypertension (BP goal <130/80) -Controlled -Current treatment: Losartan 100 mg once daily  Amlodipine 5 mg once daily in the morning HCTZ 25 mg once daily  in the afternoon -Previous medications: Benicar-HCT 20-12.5 & 40-25  mg, irbesartan 300 mg, losartan 50 mg, losartan-HCTZ 100-25 mg, metoprolol tartrate 25 mg, valsartan-HCTZ 320-25 mg.   -Current readings: 1294Tsystolic at recent OVs -Current dietary habits: does not like vegetables, actively tries to avoid sweets -Current exercise habits: [facility] lift [facility] more[facility]/knee pain. Has knee surgery 06/18/2020. -Denies hypotensive/hypertensive symptoms -Educated on BP goals and benefits of medications for prevention of heart attack, stroke and kidney damage; Exercise goal of 150 minutes per week; -Counseled to monitor BP at home, document, and provide log at future appointments -Counseled on diet and exercise extensively  Hyperlipidemia: (LDL goal < 70) 02/24/22 -Controlled, LDL 48 Simvastatin 20 mg once daily in evening Appropriate, Effective, Safe, Accessible Zetia 10 mg once daily in the afternoon Appropriate, Effective, Safe, Accessible Praluent 1530mAppropriate, Effective, Safe, Accessible -Previous medications: Repatha, atorvastatin 40 mg, rosuvastatin 10 & 20 mg, simvastatin 40 mg.  -Current dietary patterns: see htn  -Current exercise habits: see htn -Educated on Cholesterol goals;  Exercise goal of 150 minutes per week; -Counseled on diet and exercise extensively  LDL has reached goal once on Praluent.  Patient tolerating will, does not need any assistance at this time. Continue on current medications.  GERD (Goal: Prevent Symptoms) 02/24/22 -Controlled -Current treatment  None -Medications previously tried: Pantoprazole (diarrhea -Patient reports that he was having sever diarrhea and gas.  He has since cut back on  his Miralax to one capful daily and has stopped the pantoprazole.  Symptoms have resolved and he has not seen an increase in GERD symptoms.  Counseled him this was safe to continue to hold, please let us know if  symptoms change. I see no history of GI bleed for his to need to continue.  -Continue current management, can treat intermittent symptoms with OTC.   Patient Goals/Self-Care Activities Over the next 365 days, patient will:  - take medications as prescribed collaborate with provider on medication access solutions target a minimum of 150 minutes of moderate intensity exercise weekly  Medication Assistance: None needed at this time        Compliance/Adherence/Medication fill history: Care Gaps: None  Star-Rating Drugs: Valsartan 34m 01/08/22 90ds  Patient's [facility] [address] [address]Phone: [phone]: [phone] [facility] [address]Phone: [phone]: [phone]Phone: [phone]: [phone] [facility] [address]Phone: [phone]: [phone] Pt endorses 100% compliance  Follow Up:  Patient agrees to Care Plan and Follow-up. Plan: Telephone follow up appointment with care management team member scheduled for:  08/2020 . CPA to send pt PAP form.  Future Appointments  Date Time Provider [place] 03/12/2022  9:00 AM SLyndal Pulley DO [facility] None  03/20/2022  9:30 AM CLyndee Hensen PT [facility] None  04/02/2022  9:20 AM HMarin Olp MD [facility] PEC  04/27/2022 10:00 AM ACameron Sprang MD [facility] None  06/04/2022  8:45 AM [facility] HEALTH COACH [facility] PEC  07/03/2022  9:00 AM HYong ChannelSBrayton Mars MD [facility] PW[place] PharmD Clinical Pharmacist  [phone]

## 2022-02-24 ENCOUNTER — Ambulatory Visit: Payer: PPO | Admitting: Pharmacist

## 2022-02-24 DIAGNOSIS — E785 Hyperlipidemia, unspecified: Secondary | ICD-10-CM

## 2022-02-24 DIAGNOSIS — K219 Gastro-esophageal reflux disease without esophagitis: Secondary | ICD-10-CM

## 2022-02-24 NOTE — Patient Instructions (Addendum)
Visit Information   Goals Addressed   None    Patient Care Plan: CCM Pharmacy Care Plan     Problem Identified: CAD, HTN, HLD and CKD Stage III   Priority: High     Long-Range Goal: Disease Management   Start Date: 06/10/2020  Expected End Date: 06/10/2021  Recent Progress: On track  Priority: High  Note:   Current Barriers:  Unable to independently afford treatment regimen Suboptimal therapeutic regimen for hyperlipidemia   Pharmacist Clinical Goal(s):  Over the next 365 days, patient will verbalize ability to afford treatment regimen achieve adherence to monitoring guidelines and medication adherence to achieve therapeutic efficacy contact provider office for questions/concerns as evidenced notation of same in electronic health record through collaboration with PharmD and provider.   Interventions: 1:1 collaboration with [name], MD regarding development and update of comprehensive plan of care as evidenced by provider attestation and co-signature Inter-disciplinary care team collaboration (see longitudinal plan of care) Comprehensive medication review performed; medication list updated in electronic medical record  Hypertension (BP goal <130/80) -Controlled -Current treatment: Losartan 100 mg once daily  Amlodipine 5 mg once daily in the morning HCTZ 25 mg once daily in the afternoon -Previous medications: Benicar-HCT 20-12.5 & 40-25  mg, irbesartan 300 mg, losartan 50 mg, losartan-HCTZ 100-25 mg, metoprolol tartrate 25 mg, valsartan-HCTZ 320-25 mg.   -Current readings: 476L systolic at recent OVs -Current dietary habits: does not like vegetables, actively tries to avoid sweets -Current exercise habits: would like to lift weights more/use cardio equipment at [facility] but limited due to back/knee pain. Has knee surgery 06/18/2020. -Denies hypotensive/hypertensive symptoms -Educated on BP goals and benefits of medications for prevention of heart attack, stroke and  kidney damage; Exercise goal of 150 minutes per week; -Counseled to monitor BP at home, document, and provide log at future appointments -Counseled on diet and exercise extensively  Hyperlipidemia: (LDL goal < 70) 02/24/22 -Controlled, LDL 48 Simvastatin 20 mg once daily in evening Appropriate, Effective, Safe, Accessible Zetia 10 mg once daily in the afternoon Appropriate, Effective, Safe, Accessible Praluent '150mg'$  Appropriate, Effective, Safe, Accessible -Previous medications: Repatha, atorvastatin 40 mg, rosuvastatin 10 & 20 mg, simvastatin 40 mg.  -Current dietary patterns: see htn  -Current exercise habits: see htn -Educated on Cholesterol goals;  Exercise goal of 150 minutes per week; -Counseled on diet and exercise extensively  LDL has reached goal once on Praluent.  Patient tolerating will, does not need any assistance at this time. Continue on current medications.  GERD (Goal: Prevent Symptoms) 02/24/22 -Controlled -Current treatment  None -Medications previously tried: Pantoprazole (diarrhea -Patient reports that he was having sever diarrhea and gas.  He has since cut back on his Miralax to one capful daily and has stopped the pantoprazole.  Symptoms have resolved and he has not seen an increase in GERD symptoms.  Counseled him this was safe to continue to hold, please let us know if symptoms change. I see no history of GI bleed for his to need to continue.  -Continue current management, can treat intermittent symptoms with OTC.   Patient Goals/Self-Care Activities Over the next 365 days, patient will:  - take medications as prescribed collaborate with provider on medication access solutions target a minimum of 150 minutes of moderate intensity exercise weekly  Medication Assistance: None needed at this time         The patient verbalized understanding of instructions, educational materials, and care plan provided today and DECLINED offer to receive copy of patient  instructions, educational materials, and care plan.  Telephone follow up appointment with pharmacy team member scheduled for: 6 months  Edythe Clarity, [place], PharmD Clinical Pharmacist  [facility] [phone]

## 2022-02-27 ENCOUNTER — Other Ambulatory Visit (HOSPITAL_BASED_OUTPATIENT_CLINIC_OR_DEPARTMENT_OTHER): Payer: Self-pay

## 2022-02-27 MED ORDER — AREXVY 120 MCG/0.5ML IM SUSR
INTRAMUSCULAR | 0 refills | Status: DC
Start: 2022-02-27 — End: 2022-03-25
  Filled 2022-02-27: qty 1, 1d supply, fill #0

## 2022-03-02 ENCOUNTER — Ambulatory Visit: Payer: PPO | Admitting: Physical Therapy

## 2022-03-02 ENCOUNTER — Encounter: Payer: Self-pay | Admitting: Physical Therapy

## 2022-03-02 DIAGNOSIS — M25561 Pain in right knee: Secondary | ICD-10-CM

## 2022-03-02 DIAGNOSIS — M5459 Other low back pain: Secondary | ICD-10-CM

## 2022-03-02 DIAGNOSIS — G8929 Other chronic pain: Secondary | ICD-10-CM | POA: Diagnosis not present

## 2022-03-02 DIAGNOSIS — M542 Cervicalgia: Secondary | ICD-10-CM | POA: Diagnosis not present

## 2022-03-02 DIAGNOSIS — M25551 Pain in right hip: Secondary | ICD-10-CM | POA: Diagnosis not present

## 2022-03-02 NOTE — Therapy (Signed)
OUTPATIENT PHYSICAL THERAPY TREATMENT   Patient Name: Joshua [name] MRN: [id number] DOB:1940-03-29, 81 y.o., male Today's Date: 03/02/2022   PT End of Session - 03/02/22 1158     Visit Number 9    Number of Visits 16    Date for PT Re-Evaluation 03/16/22    Authorization Type HTA    5 prior visits this year for PT    PT Start Time 82    PT Stop Time 0177    PT Time Calculation (min) 39 min    Activity Tolerance Patient tolerated treatment well    Behavior During Therapy [facility] for tasks assessed/performed                 Past Medical History:  Diagnosis Date   Acute medial meniscal tear    Anemia 2015   Arthritis    "middle finger right hand; right knee; neck" (02/06/2014)   Asthma    seasonal   Bladder cancer ([place]) 04/2010   "cauterized during prostate OR"   CAD (coronary artery disease)    CABG 2001   Diverticulosis    DJD (degenerative joint disease)    BACK   GERD (gastroesophageal reflux disease)    H/O inguinal hernia repair 12/2018   History of gout    Hyperlipidemia    Hypertension    IBS (irritable bowel syndrome)    Obesity    OSA (obstructive sleep apnea)    USES CPAP    Pancreatitis ~ 1980   Peripheral vascular disease ([place])    Pre-diabetes    Prostate cancer ([place]) 04/2010   Past Surgical History:  Procedure Laterality Date   ANTERIOR CERVICAL DECOMP/DISCECTOMY FUSION  05/26/2006   PLATE PLACED   BACK SURGERY     CARDIAC CATHETERIZATION  11/20/1999   CORONARY ARTERY BYPASS GRAFT  11/20/1999   SVG-RI1-RI2, SVG-OM, SVG-dRCA   CORONARY STENT INTERVENTION N/A 04/13/2019   Procedure: CORONARY STENT INTERVENTION;  Surgeon: Burnell Blanks, MD;  Location: [place] [facility];  Service: Cardiovascular;  Laterality: N/A;   HERNIA REPAIR     INGUINAL HERNIA REPAIR Left 01/02/2019   Procedure: OPEN LEFT INGUINAL HERNIA REPAIR WITH MESH;  Surgeon: Johnathan Hausen, MD;  Location: [facility];  Service: General;  Laterality: Left;   KNEE  ARTHROSCOPY Right 1992; 04/2008   LAPAROSCOPIC CHOLECYSTECTOMY  2007   LEFT HEART CATH AND CORS/GRAFTS ANGIOGRAPHY N/A 04/13/2019   Procedure: LEFT HEART CATH AND CORS/GRAFTS ANGIOGRAPHY;  Surgeon: Burnell Blanks, MD;  Location: [place] [facility];  Service: Cardiovascular;  Laterality: N/A;   NASAL SEPTUM SURGERY Right 2007   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   ROBOT ASSISTED LAPAROSCOPIC RADICAL PROSTATECTOMY  04/2010   THYROIDECTOMY, PARTIAL  1980   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   Procedure: LEFT TOTAL KNEE ARTHROPLASTY;  Surgeon: Melrose Nakayama, MD;  Location: [facility];  Service: Orthopedics;  Laterality: Left;   Patient Active Problem List   Diagnosis Date Noted   Degenerative cervical disc 01/12/2022   Greater trochanteric bursitis of right hip 02/17/2021   Hyperactive bowel sounds 01/29/2021   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 ([place]) 09/25/2020   Primary osteoarthritis of left knee 06/18/2020   Pre-operative respiratory examination 06/05/2020   Coronary artery disease involving coronary bypass graft of native heart without angina pectoris 04/07/2020   Trigger thumb, left thumb 01/24/2020   Overactive bladder 11/24/2019   Left lateral epicondylitis 09/05/2019   Degenerative arthritis of left knee 08/08/2019  Educated about COVID-19 virus infection 04/20/2019   Unstable angina (H[facility])    Greater trochanteric bursitis of left hip 10/27/2018   Degenerative disc disease, lumbar 10/27/2018   Abdominal muscle strain, initial encounter 06/16/2018   Aortic atherosclerosis ([place]) 05/05/2018   Hyperglycemia 10/07/2017   Weakness of left leg 06/21/2017   DJD (degenerative joint disease)    Arthritis    Neck pain 02/08/2017   Venous insufficiency 12/29/2016   Ganglion cyst of tendon sheath of right hand 12/29/2016   Avulsion fracture of metatarsal bone of right foot 11/25/2016   Gout 11/20/2016   Obesity, Class I, BMI 30-34.9 10/03/2015    Hereditary and idiopathic peripheral neuropathy 09/25/2014   Allergic rhinitis 05/31/2014   Fatigue 11/09/2013   CKD (chronic kidney disease), stage III ([place]) 10/23/2013   Dizziness 08/23/2013   Neuropathy ([place]) 08/21/2013   RLS (restless legs syndrome) 01/03/2013   Chronic bilateral low back pain with left-sided sciatica 10/09/2011   History of bladder cancer 04/01/2010   OSTEOARTHRITIS, LOWER LEG, LEFT 10/04/2009   Generalized abdominal pain 01/31/2009   ANEMIA, IRON DEFICIENCY 11/06/2008   TINNITUS, CHRONIC, 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   Essential hypertension 02/21/2007   OSA (obstructive sleep apnea) 11/02/2006   Hyperlipidemia 10/12/2006   Major depression in full remission ([place]) 10/12/2006   Coronary atherosclerosis 10/12/2006   Asthma 10/12/2006   GERD 10/12/2006   Irritable bowel syndrome 10/12/2006    PCP: [name]   REFERRING PROVIDER: Charlann Boxer  REFERRING DIAG: Neck pain, R hip pain   THERAPY DIAG:  Cervicalgia  Other low back pain  Pain in right hip  Chronic pain of right knee  Rationale for Evaluation and Treatment Rehabilitation  ONSET DATE:   SUBJECTIVE:                                                                                                                                                                                      SUBJECTIVE STATEMENT:  03/02/22: Pt states neck has been OK, not too sore. Back still most painful with prolonged standing, and IADLs. Has been doing HEP    Eval: Pt states neck pain that started about 1 month ago, no injury to report. Most pain in High cervical region on R,  Pain comes and goes , pain with L rotation. Headaches at times from neck pain- evening.  Also has pain in :  R hip pain,  R knee-  gives way,  Had L knee replaced- painful all the time.  Back pain bilateral L>R.   Neuropathy- taking gabapentin. - very painful- feels like needles.  Exercise: has bike at clubhouse(not using), likes to walk but now  too painful.  Pt has had previous injections:  L hip 08/2021,  06/2021 L4/5    PERTINENT HISTORY: PVD, CAD, CABG, Neuropathy, CKD, cervical fusion, L knee surgery,    PAIN:  Are you having pain? Yes: NPRS scale: 4/10 Pain location: Neck  Pain description: sore  Aggravating factors: L rotation, fleixon  Relieving factors: rest  Are you having pain? Yes: NPRS scale: 5/10 Pain location: R knee Pain description: pain, weak Aggravating factors: increased activity  Relieving factors: rest  Are you having pain? Yes: NPRS scale: 2/10 Pain location: R hip Pain description: pain Aggravating factors: increased activity  Relieving factors: rest   Are you having pain? Yes: NPRS scale: 4 /10 Pain location: Back Pain description: sore, achey Aggravating factors: Standing/walking  Relieving factors: sitting/rest    PRECAUTIONS: None  WEIGHT BEARING RESTRICTIONS: No  FALLS:  Has patient fallen in last 6 months? No   PLOF: Independent  PATIENT GOALS: Decreased pain in neck, hip, back   OBJECTIVE:   DIAGNOSTIC FINDINGS:  Recent neck xray:  IMPRESSION: C5 through C7 anterior fusion again noted. Hardware intact. Diffuse degenerative change. No acute bony abnormality.   Lumbar MRI:  IMPRESSION: 1. 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 Bilateral neural foraminal narrowing at L4-L5 and L5-S1 secondary to advanced facet joint arthropathy.   2. 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   3.  Grade 1 anterolisthesis of L4, unchanged.   4. 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 Follow-up with annual abdominal sonogram is recommended.  R hip: 01/2021:  Mild joint space loss noted at the right hip. Advanced degenerative changes partially visualized  in the lower lumbar spine.  L knee: IMPRESSION: Uncomplicated left total knee prosthesis.    COGNITION: Overall cognitive status: Within functional limits for tasks assessed     POSTURE: rounded shoulders, forward head   UPPER EXTREMITY ROM:   Cervical: L rotation: 55,   R rotation: 65 , flex: mild limitation/pain,  ext: mod/sig limitation:   Shoulder: mild limitation for R>L shoulder flex, IR behind back  Knees:  painful with movement   , L: 0- 110   R: 0- 115  Hips: mild limitation for rotation bil,      UPPER EXTREMITY MMT:  Shoulders: 4+/5 ,   Knees: 4+/5,   Hips: 4/5    JOINT MOBILITY TESTING:  Hypomobile c-spine    PALPATION:  Tenderness at bil SO, R low occiput, mild tenderness in bil paraspinals,  Tightness in R UT  Tenderness/tightness in R glute musculature, mild in gr troch.   Tenderness in L lumbar/thoracic paraspinals, and central l-spine      TODAY'S TREATMENT:  DATE:    03/02/22: Therapeutic Exercise: Aerobic:  Supine:  cervical rotation x 10; Pelvic tilts x 10; Supine marching x 20 with TA;  Hip abd Blue TB x 20 with TA;  Bridging x 10 (pain)  Seated:   Standing:  Rows Blue TB x 20;   Stretches:    LTR for back and hip rotation x 15;  Neuromuscular Re-education:  Manual Therapy:   Manual ROM for c-spine for SB and rotation,  SOR; long leg distraction for R hip,       02/16/2022  Therapeutic Exercise: Aerobic: Bike L2 x 8 min;  Supine:  cervical rotation x 10; TA contraction x 20,  Supine marching x 20 with TA;  Seated:   Standing:  Rows Blue TB x 20;     Bil shoulder ER YTB x 20 ; Stretches:  Seated Hamstring stretch 30 sec x 3 bil;   LTR for back and hip rotation x 15;  Neuromuscular Re-education:  Manual Therapy:   Manual ROM for c-spine for SB and rotation,  SOR; long leg distraction  for R hip, manual hip flex, ER and IR stretch for R hip.      PATIENT EDUCATION: Education details: reviewed HEP Person educated: Patient Education method: Explanation, Demonstration, Tactile cues, Verbal cues, and Handouts Education comprehension: verbalized understanding, returned demonstration, verbal cues required, tactile cues required, and needs further education    HOME EXERCISE PROGRAM: Access Code: [email or web address]    ASSESSMENT:  CLINICAL IMPRESSION:  03/02/22: Pt with soreness in R side of neck, high cervical region with rotation to L and with palpation. Good ability for activities today. He is doing well with increased activity at home, and compliance with HEP, but continues to have variable pain. Plan to progress strength as tolerated.   Pt with good tolerance for ther ex, but still having quite a bit of variable soreness in back and neck from day to day. Pt to benefit from continued care for mobility and strengthening. Pt doing better with compliance with HEP and has been riding bike daily.   Eval: Patient presents with primary complaint of increased pain in cervical region. He has soreness in R high cervical region, increased with activity and ROM. He has decreased ROM and poor postural awareness. Pt also has several other areas of pain. R hip/glute with increased pain and muscle tension. He has pain in bil knees, and low back. He also has neuropathy that effects balance. Gait/balance to be further assessed in future. Pt to benefit from skilled PT to improve deficits and pain.    OBJECTIVE IMPAIRMENTS: decreased activity tolerance, decreased balance, decreased knowledge of use of DME, decreased mobility, difficulty walking, decreased ROM, decreased strength, increased muscle spasms, impaired flexibility, improper body mechanics, and pain.   ACTIVITY LIMITATIONS: lifting, bending, standing, squatting, stairs, and locomotion level  PARTICIPATION LIMITATIONS: cleaning, driving,  shopping, and community activity  PERSONAL FACTORS: 1-2 comorbidities: multiple pain locations, OA,   are also affecting patient's functional outcome.   REHAB POTENTIAL: Good  CLINICAL DECISION MAKING: Evolving/moderate complexity  EVALUATION COMPLEXITY: Moderate  GOALS: Goals reviewed with patient? Yes  SHORT TERM GOALS: Target date: 02/02/2022    Pt to be independent with HEP  Goal status: MET  2.  Pt to report decreased pain in Neck to 0-3/10    Goal status: IN PROGRESS    LONG TERM GOALS: Target date: 03/16/2022     1.Pt to be independent with final HEP, for neck, knees and back pain  Goal status: INITIAL  2.  Pt to demo ability to self correct posture at least 50 % of the time in clinic.    Goal status: INITIAL  3.  Pt to demo ability for full ROM for c-spine, without pain greater than 2/10, to improve ability for driving and ADLs.   Goal status: INITIAL  4.  Pt to report decreased pain in R hip/glute to 0-3/10 with activity, to improve ability for IADLs.   Goal status: INITIAL    PLAN: PT FREQUENCY: 2x/week  PT DURATION: 8 weeks  PLANNED INTERVENTIONS: Therapeutic exercises, Therapeutic activity, Neuromuscular re-education, Balance training, Gait training, Patient/Family education, Self Care, Joint mobilization, Joint manipulation, Stair training, Orthotic/Fit training, DME instructions, Dry Needling, Electrical stimulation, Spinal manipulation, Spinal mobilization, Cryotherapy, Moist heat, Vasopneumatic device, Traction, Ultrasound, Ionotophoresis 75m/ml Dexamethasone, and Manual therapy  PLAN FOR NEXT SESSION:   manual for neck, R side muscle tension relief,  R glute muscles,  HEP for back, hip, knees when able.    LLyndee Hensen PT, DPT 11:59 AM  03/02/22

## 2022-03-04 DIAGNOSIS — J3089 Other allergic rhinitis: Secondary | ICD-10-CM | POA: Diagnosis not present

## 2022-03-04 DIAGNOSIS — J3081 Allergic rhinitis due to animal (cat) (dog) hair and dander: Secondary | ICD-10-CM | POA: Diagnosis not present

## 2022-03-04 DIAGNOSIS — J301 Allergic rhinitis due to pollen: Secondary | ICD-10-CM | POA: Diagnosis not present

## 2022-03-09 NOTE — Progress Notes (Unsigned)
[place] Phone: [phone] Subjective:   Joshua [name], am serving as a scribe for Dr. Hulan [name].  I'm seeing this patient by the request  of:  Joshua Olp, MD  CC: Right hip and left hip pain allover pain  [id number]  01/12/2022 History of a two-level fusion noted. There does appear to be stable overall.. We will start with formal physical therapy that I think will help some of the neck pain work on posture and ergonomics and wounds. Multiple different allergies so we will not start any medications at this time. Worsening pain effusion as well as see if he would be candidate for possible orals. Social determinants of health this will be difficult for surgical intervention secondary other comorbidities.   Could be within the differential. Will consider the possibility of further evaluation or epidurals if necessary.   Patient given a Toradol injection today.  Home the side.  Hopefully will be beneficial.  Wanted him to avoid any type of steroids secondary to patient side effects at the moment.  Discussed with patient about posture and ergonomics.  Discussed hip abductor strengthening.  Discussed differential includes possible lumbar radiculopathy.  Total time with patient as well as reviewing patient's imaging and records including other providers notes 33 minutes.      Update 03/12/2022 Joshua [name] is a 81 y.o. male coming in with complaint of cervical, lumbar and R hip pain. Patient states that injection last visit helped for one week. Patient is doing PT for back, hip and knee but does not feel any great improvement. Pain in piriformis in R hip. L hip is also starting to hurt.       Past Medical History:  Diagnosis Date   Acute medial meniscal tear    Anemia 2015   Arthritis    "middle finger right hand; right knee; neck" (02/06/2014)   Asthma    seasonal   Bladder cancer ([place]) 04/2010    "cauterized during prostate OR"   CAD (coronary artery disease)    CABG 2001   Diverticulosis    DJD (degenerative joint disease)    BACK   GERD (gastroesophageal reflux disease)    H/O inguinal hernia repair 12/2018   History of gout    Hyperlipidemia    Hypertension    IBS (irritable bowel syndrome)    Obesity    OSA (obstructive sleep apnea)    USES CPAP    Pancreatitis ~ 1980   Peripheral vascular disease ([place])    Pre-diabetes    Prostate cancer ([place]) 04/2010   Past Surgical History:  Procedure Laterality Date   ANTERIOR CERVICAL DECOMP/DISCECTOMY FUSION  05/26/2006   PLATE PLACED   BACK SURGERY     CARDIAC CATHETERIZATION  11/20/1999   CORONARY ARTERY BYPASS GRAFT  11/20/1999   SVG-RI1-RI2, SVG-OM, SVG-dRCA   CORONARY STENT INTERVENTION N/A 04/13/2019   Procedure: CORONARY STENT INTERVENTION;  Surgeon: Joshua Blanks, MD;  Location: [facility];  Service: Cardiovascular;  Laterality: N/A;   HERNIA REPAIR     INGUINAL HERNIA REPAIR Left 01/02/2019   Procedure: OPEN LEFT INGUINAL HERNIA REPAIR WITH MESH;  Surgeon: Joshua Hausen, MD;  Location: [facility];  Service: General;  Laterality: Left;   KNEE ARTHROSCOPY Right 1992; 04/2008   LAPAROSCOPIC CHOLECYSTECTOMY  2007   LEFT HEART CATH AND CORS/GRAFTS ANGIOGRAPHY N/A 04/13/2019   Procedure: LEFT HEART CATH AND CORS/GRAFTS ANGIOGRAPHY;  Surgeon: Joshua [name]  D, MD;  Location: [facility];  Service: Cardiovascular;  Laterality: N/A;   NASAL SEPTUM SURGERY Right 2007   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   ROBOT ASSISTED LAPAROSCOPIC RADICAL PROSTATECTOMY  04/2010   THYROIDECTOMY, PARTIAL  1980   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   Procedure: LEFT TOTAL KNEE ARTHROPLASTY;  Surgeon: Joshua Nakayama, MD;  Location: [facility];  Service: Orthopedics;  Laterality: Left;   Social History   Socioeconomic History   Marital status: Married    Spouse name: Not on file   Number of children: Not on  file   Years of education: Not on file   Highest education level: Not on file  Occupational History   Occupation: retired    Fish farm manager: RETIRED  Tobacco Use   Smoking status: Former    Packs/day: 1.00    Years: 35.00    Total pack years: 35.00    Types: Cigarettes    Quit date: 06/16/1992    Years since quitting: 29.7   Smokeless tobacco: Never  Vaping Use   Vaping Use: Never used  Substance and Sexual Activity   Alcohol use: Yes    Alcohol/week: 2.0 standard drinks of alcohol    Types: 2 Shots of liquor per week    Comment: occ   Drug use: [name]   Sexual activity: Not Currently    Partners: Female  Other Topics Concern   Not on file  Social History Narrative   Married 42 years in 2015. [name] kids (mumps at age 89)      Retired from [employer] in Engineer, technical sales.  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      Hobbies: poker, movies and tv, staying active      Right handed   2 level home with steps he uses   Social Determinants of Radio broadcast assistant Strain: Low Risk  (05/22/2021)   Overall Financial Resource Strain (CARDIA)    Difficulty of Paying Living Expenses: Not hard at all  Food Insecurity: [name] Food Insecurity (05/22/2021)   Hunger Vital Sign    Worried About Running Out of Food in the Last Year: Never true    [place] in the Last Year: Never true  Transportation Needs: [name] Transportation Needs (05/22/2021)   PRAPARE - Hydrologist (Medical): [name]    Lack of Transportation (Non-Medical): [name]  Physical Activity: Inactive (05/22/2021)   Exercise Vital Sign    Days of Exercise per Week: 0 days    Minutes of Exercise per Session: 0 min  Stress: [name] Stress Concern Present (05/22/2021)   [place]    Feeling of Stress : Only a little  Social Connections: Moderately Integrated (05/22/2021)   Social Connection and Isolation Panel [NHANES]    Frequency of Communication with Friends and  Family: More than three times a week    Frequency of Social Gatherings with Friends and Family: More than three times a week    Attends Religious Services: Never    Marine scientist or Organizations: Yes    Attends Archivist Meetings: 1 to 4 times per year    Marital Status: Married   Allergies  Allergen Reactions   Benadryl [Diphenhydramine] Other (See Comments)    Pt told not to take because of bypass surgery   Bromfed Nausea And Vomiting   Clarithromycin Other (See Comments)     gastritis  Codeine Other (See Comments)    Trouble breathing   Cymbalta [Duloxetine Hcl]     Abdominal pain and later chest pain as well as worsening fatigue    Doxycycline Hyclate Nausea And Vomiting   Lexapro [Escitalopram]     Dizziness   Modafinil Other (See Comments)     anxiety-nervousness   Oxycodone-Acetaminophen Itching   Promethazine Hcl Other (See Comments)     fainting   Quinolones Nausea Only    Cipro "felt real bad"   Repatha [Evolocumab]     Weak, worn out   Telithromycin Nausea And Vomiting   Tramadol Nausea Only   Hydrocodone-Acetaminophen Rash   Statins Other (See Comments)    Leg cramps and makes patient feel bad. Per pt tried multiple in years past   Sulfonamide Derivatives Rash   Family History  Problem Relation Age of Onset   Heart disease Mother    Hyperlipidemia Mother    Heart disease Father    Hyperlipidemia Father    Diabetes Brother    Colon cancer Neg Hx    Pancreatic cancer Neg Hx    Esophageal cancer Neg Hx    Stomach cancer Neg Hx    Liver cancer Neg Hx      Current Outpatient Medications (Cardiovascular):    Alirocumab (PRALUENT) 150 MG/ML SOAJ, INJECT 1 DOSE INTO THE SKIN EVERY 14 (FOURTEEN) DAYS.   amLODipine (NORVASC) 5 MG tablet, TAKE 1 TABLET (5 MG TOTAL) BY MOUTH DAILY.   EPINEPHrine 0.3 mg/0.3 mL IJ SOAJ injection, Inject 0.3 mg into the muscle as needed for anaphylaxis. for allergic reaction   ezetimibe (ZETIA) 10 MG tablet,  TAKE 1 TABLET BY MOUTH EVERY DAY   hydrochlorothiazide (HYDRODIURIL) 25 MG tablet, TAKE 1 TABLET BY MOUTH EVERY DAY   nitroGLYCERIN (NITROSTAT) 0.4 MG SL tablet, PLACE 1 TABLET UNDER THE TONGUE EVERY 5 (FIVE) MINUTES AS NEEDED.   valsartan (DIOVAN) 320 MG tablet, TAKE 1 TABLET BY MOUTH EVERY DAY  Current Outpatient Medications (Respiratory):    albuterol (PROAIR HFA) 108 (90 Base) MCG/ACT inhaler, Inhale 1-2 puffs into the lungs every 6 (six) hours as needed for wheezing or shortness of breath.   FLOVENT HFA 110 MCG/ACT inhaler, Inhale 1 puff into the lungs 2 (two) times daily.    fluticasone (FLONASE) 50 MCG/ACT nasal spray, Place 1 spray into both nostrils 2 (two) times daily.    ipratropium (ATROVENT HFA) 17 MCG/ACT inhaler, Inhale 2 puffs into the lungs 3 (three) times daily.  Current Outpatient Medications (Analgesics):    acetaminophen (TYLENOL) 500 MG tablet, Take 1,000 mg by mouth every 6 (six) hours as needed for moderate pain or headache.   aspirin EC 81 MG tablet, Take 1 tablet (81 mg total) by mouth 2 (two) times daily after a meal.  Current Outpatient Medications (Hematological):    Cyanocobalamin (B-12) 2500 MCG TABS, Take 1,250 mcg by mouth daily.   folic acid (FOLVITE) 709 MCG tablet, Take 800 mcg by mouth daily.   Current Outpatient Medications (Other):    cholecalciferol (VITAMIN D3) 25 MCG (1000 UNIT) tablet, Take 1,000 Units by mouth daily.   COVID-19 mRNA vaccine 2023-2024 (COMIRNATY) syringe, Inject into the muscle.   diazepam (VALIUM) 5 MG tablet, TAKE 1 TABLET (5 MG TOTAL) BY MOUTH AT BEDTIME AS NEEDED (SLEEP).   gabapentin (NEURONTIN) 300 MG capsule, Take 1 cap in AM, 1 cap at noon, 3 caps at bedtime   Gabapentin Enacarbil (HORIZANT) 600 MG TBCR, Take 1 tablet (600 mg total)  by mouth at bedtime.   GEMTESA 75 MG TABS, TAKE 1 TABLET BY MOUTH DAILY.   Glucos-Chond-Hyal Ac-Ca Fructo (MOVE FREE JOINT HEALTH ADVANCE PO), Take 1 tablet by mouth daily.   ketoconazole  (NIZORAL) 2 % cream, Apply 1 application topically See admin instructions. Twice a day every other day   Miconazole Nitrate (LOTRIMIN AF DEODORANT POWDER) 2 % AERP, Apply 1 spray topically daily as needed (jock itch).   pantoprazole (PROTONIX) 40 MG tablet, TAKE 1 TABLET BY MOUTH EVERY DAY   polyethylene glycol powder (GLYCOLAX/MIRALAX) powder, Take 17 g by mouth daily.   Wheat Dextrin (BENEFIBER) POWD, Take 1 Dose by mouth daily. 1 dose = 2 teaspoons   RSV vaccine recomb adjuvanted (AREXVY) 120 MCG/0.5ML injection, Inject into the muscle.   Reviewed prior external information including notes and imaging from  primary care provider As well as notes that were available from care everywhere and other healthcare systems.  Past medical history, social, surgical and family history all reviewed in electronic medical record.  [name] pertanent information unless stated regarding to the chief complaint.   Review of Systems:  [name] headache, visual changes, nausea, vomiting, diarrhea, constipation, dizziness, abdominal pain, skin rash, fevers, chills, night sweats, weight loss, swollen lymph nodes, body aches, joint swelling, chest pain, shortness of breath, mood changes. POSITIVE muscle aches  Objective  Blood pressure 110/60, pulse 79, height '5\' 6"'$  (1.676 m), weight 209 lb (94.8 kg), SpO2 98 %.   General: [name] apparent distress alert and oriented x3 mood and affect normal, dressed appropriately.  HEENT: Pupils equal, extraocular movements intact  Respiratory: Patient's speak in full sentences and does not appear short of breath  1+ noted of the lower extremity. Patient does have an antalgic gait noted. Patient is severely tender to palpation in multiple areas or areas.   Impression and Recommendations:     The above documentation has been reviewed and is accurate and complete Lyndal Pulley, DO

## 2022-03-10 ENCOUNTER — Encounter: Payer: Self-pay | Admitting: Physical Therapy

## 2022-03-10 ENCOUNTER — Ambulatory Visit: Payer: PPO | Admitting: Physical Therapy

## 2022-03-10 DIAGNOSIS — M25551 Pain in right hip: Secondary | ICD-10-CM

## 2022-03-10 DIAGNOSIS — M542 Cervicalgia: Secondary | ICD-10-CM

## 2022-03-10 DIAGNOSIS — M5459 Other low back pain: Secondary | ICD-10-CM

## 2022-03-10 NOTE — Therapy (Addendum)
OUTPATIENT PHYSICAL THERAPY TREATMENT   Patient Name: Joshua [name] MRN: [id number] DOB:[birth year], 81 y.o., male Today's Date: 03/10/2022   PT End of Session - 03/10/22 0949     Visit Number 10    Number of Visits 16    Date for PT Re-Evaluation 03/16/22    Authorization Type HTA    5 prior visits this year for PT    PT Start Time 0853    PT Stop Time 0931    PT Time Calculation (min) 38 min    Activity Tolerance Patient tolerated treatment well    Behavior During Therapy [facility] for tasks assessed/performed                  Past Medical History:  Diagnosis Date   Acute medial meniscal tear    Anemia 2015   Arthritis    "middle finger right hand; right knee; neck" (02/06/2014)   Asthma    seasonal   Bladder cancer ([place]) 04/2010   "cauterized during prostate OR"   CAD (coronary artery disease)    CABG 2001   Diverticulosis    DJD (degenerative joint disease)    BACK   GERD (gastroesophageal reflux disease)    H/O inguinal hernia repair 12/2018   History of gout    Hyperlipidemia    Hypertension    IBS (irritable bowel syndrome)    Obesity    OSA (obstructive sleep apnea)    USES CPAP    Pancreatitis ~ 1980   Peripheral vascular disease ([place])    Pre-diabetes    Prostate cancer ([place]) 04/2010   Past Surgical History:  Procedure Laterality Date   ANTERIOR CERVICAL DECOMP/DISCECTOMY FUSION  05/26/2006   PLATE PLACED   BACK SURGERY     CARDIAC CATHETERIZATION  11/20/1999   CORONARY ARTERY BYPASS GRAFT  11/20/1999   SVG-RI1-RI2, SVG-OM, SVG-dRCA   CORONARY STENT INTERVENTION N/A 04/13/2019   Procedure: CORONARY STENT INTERVENTION;  Surgeon: Burnell Blanks, MD;  Location: [place] [facility];  Service: Cardiovascular;  Laterality: N/A;   HERNIA REPAIR     INGUINAL HERNIA REPAIR Left 01/02/2019   Procedure: OPEN LEFT INGUINAL HERNIA REPAIR WITH MESH;  Surgeon: Johnathan Hausen, MD;  Location: [facility];  Service: General;  Laterality: Left;   KNEE  ARTHROSCOPY Right 1992; 04/2008   LAPAROSCOPIC CHOLECYSTECTOMY  2007   LEFT HEART CATH AND CORS/GRAFTS ANGIOGRAPHY N/A 04/13/2019   Procedure: LEFT HEART CATH AND CORS/GRAFTS ANGIOGRAPHY;  Surgeon: Burnell Blanks, MD;  Location: [place] [facility];  Service: Cardiovascular;  Laterality: N/A;   NASAL SEPTUM SURGERY Right 2007   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   ROBOT ASSISTED LAPAROSCOPIC RADICAL PROSTATECTOMY  04/2010   THYROIDECTOMY, PARTIAL  1980   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   Procedure: LEFT TOTAL KNEE ARTHROPLASTY;  Surgeon: Melrose Nakayama, MD;  Location: [facility];  Service: Orthopedics;  Laterality: Left;   Patient Active Problem List   Diagnosis Date Noted   Degenerative cervical disc 01/12/2022   Greater trochanteric bursitis of right hip 02/17/2021   Hyperactive bowel sounds 01/29/2021   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 ([place]) 09/25/2020   Primary osteoarthritis of left knee 06/18/2020   Pre-operative respiratory examination 06/05/2020   Coronary artery disease involving coronary bypass graft of native heart without angina pectoris 04/07/2020   Trigger thumb, left thumb 01/24/2020   Overactive bladder 11/24/2019   Left lateral epicondylitis 09/05/2019   Degenerative arthritis of left knee  08/08/2019   Educated about COVID-19 virus infection 04/20/2019   Unstable angina ([facility])    Greater trochanteric bursitis of left hip 10/27/2018   Degenerative disc disease, lumbar 10/27/2018   Abdominal muscle strain, initial encounter 06/16/2018   Aortic atherosclerosis ([place]) 05/05/2018   Hyperglycemia 10/07/2017   Weakness of left leg 06/21/2017   DJD (degenerative joint disease)    Arthritis    Neck pain 02/08/2017   Venous insufficiency 12/29/2016   Ganglion cyst of tendon sheath of right hand 12/29/2016   Avulsion fracture of metatarsal bone of right foot 11/25/2016   Gout 11/20/2016   Obesity, Class I, BMI 30-34.9 10/03/2015    Hereditary and idiopathic peripheral neuropathy 09/25/2014   Allergic rhinitis 05/31/2014   Fatigue 11/09/2013   CKD (chronic kidney disease), stage III ([place]) 10/23/2013   Dizziness 08/23/2013   Neuropathy ([place]) 08/21/2013   RLS (restless legs syndrome) 01/03/2013   Chronic bilateral low back pain with left-sided sciatica 10/09/2011   History of bladder cancer 04/01/2010   OSTEOARTHRITIS, LOWER LEG, LEFT 10/04/2009   Generalized abdominal pain 01/31/2009   ANEMIA, IRON DEFICIENCY 11/06/2008   TINNITUS, CHRONIC, 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   Essential hypertension 02/21/2007   OSA (obstructive sleep apnea) 11/02/2006   Hyperlipidemia 10/12/2006   Major depression in full remission ([place]) 10/12/2006   Coronary atherosclerosis 10/12/2006   Asthma 10/12/2006   GERD 10/12/2006   Irritable bowel syndrome 10/12/2006    PCP: [name]   REFERRING PROVIDER: Charlann Boxer  REFERRING DIAG: Neck pain, R hip pain   THERAPY DIAG:  Cervicalgia  Pain in right hip  Other low back pain  Rationale for Evaluation and Treatment Rehabilitation  ONSET DATE:   SUBJECTIVE:                                                                                                                                                                                      SUBJECTIVE STATEMENT:  03/10/22: Pt states neck has been OK, not too sore. Has been doing HEP. Neuropathy in feet and hands bothersome today.    Eval: Pt states neck pain that started about 1 month ago, no injury to report. Most pain in High cervical region on R,  Pain comes and goes , pain with L rotation. Headaches at times from neck pain- evening.  Also has pain in :  R hip pain,  R knee-  gives way,  Had L knee replaced- painful all the time.  Back pain bilateral L>R.   Neuropathy- taking gabapentin. - very painful- feels like needles.  Exercise: has bike at clubhouse(not using), likes to walk but now too painful.   Pt has had previous  injections:  L hip 08/2021,  06/2021 L4/5    PERTINENT HISTORY: PVD, CAD, CABG, Neuropathy, CKD, cervical fusion, L knee surgery,    PAIN:  Are you having pain? Yes: NPRS scale: 4/10 Pain location: Neck  Pain description: sore  Aggravating factors: L rotation, fleixon  Relieving factors: rest  Are you having pain? Yes: NPRS scale: 5/10 Pain location: R knee Pain description: pain, weak Aggravating factors: increased activity  Relieving factors: rest  Are you having pain? Yes: NPRS scale: 2/10 Pain location: R hip Pain description: pain Aggravating factors: increased activity  Relieving factors: rest   Are you having pain? Yes: NPRS scale: 4 /10 Pain location: Back Pain description: sore, achey Aggravating factors: Standing/walking  Relieving factors: sitting/rest    PRECAUTIONS: None  WEIGHT BEARING RESTRICTIONS: No  FALLS:  Has patient fallen in last 6 months? No   PLOF: Independent  PATIENT GOALS: Decreased pain in neck, hip, back   OBJECTIVE:   DIAGNOSTIC FINDINGS:  Recent neck xray:  IMPRESSION: C5 through C7 anterior fusion again noted. Hardware intact. Diffuse degenerative change. No acute bony abnormality.   Lumbar MRI:  IMPRESSION: 1. 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 Bilateral neural foraminal narrowing at L4-L5 and L5-S1 secondary to advanced facet joint arthropathy.   2. 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   3.  Grade 1 anterolisthesis of L4, unchanged.   4. 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 Follow-up with annual abdominal sonogram is recommended.  R hip: 01/2021:  Mild joint space loss noted at the right hip. Advanced degenerative changes partially visualized in the lower lumbar spine.  L  knee: IMPRESSION: Uncomplicated left total knee prosthesis.    COGNITION: Overall cognitive status: Within functional limits for tasks assessed     POSTURE: rounded shoulders, forward head   UPPER EXTREMITY ROM:   Cervical: L rotation: 55,   R rotation: 65 , flex: mild limitation/pain,  ext: mod/sig limitation:   Shoulder: mild limitation for R>L shoulder flex, IR behind back  Knees:  painful with movement   , L: 0- 110   R: 0- 115  Hips: mild limitation for rotation bil,      UPPER EXTREMITY MMT:  Shoulders: 4+/5 ,   Knees: 4+/5,   Hips: 4/5    JOINT MOBILITY TESTING:  Hypomobile c-spine    PALPATION:  Tenderness at bil SO, R low occiput, mild tenderness in bil paraspinals,  Tightness in R UT  Tenderness/tightness in R glute musculature, mild in gr troch.   Tenderness in L lumbar/thoracic paraspinals, and central l-spine      TODAY'S TREATMENT:  DATE:    03/10/22: Therapeutic Exercise: Aerobic: Bike L2 x 8 min; Supine:   Supine marching x 20 , Blue TB with TA;  Hip abd Blue TB x 20 with TA;   TA with breathing x 15;  Seated:   Standing:  Rows Blue TB x 20;   Stretches:   LTR for back and hip rotation x 15; SKTC 30 sec x 3; Pelvic tilts x 15;  Neuromuscular Re-education:  Manual Therapy:    Self Care: education on use of vibration, gun and pad, for management of neuropathy.   03/02/22: Therapeutic Exercise: Aerobic:  Supine:  cervical rotation x 10; Pelvic tilts x 10; Supine marching x 20 with TA;  Hip abd Blue TB x 20 with TA;  Bridging x 10 (pain)  Seated:   Standing:  Rows Blue TB x 20;   Stretches:    LTR for back and hip rotation x 15;  Neuromuscular Re-education:  Manual Therapy:   Manual ROM for c-spine for SB and rotation,  SOR; long leg distraction for R hip,       02/16/2022  Therapeutic  Exercise: Aerobic: Bike L2 x 8 min;  Supine:  cervical rotation x 10; TA contraction x 20,  Supine marching x 20 with TA;  Seated:   Standing:  Rows Blue TB x 20;     Bil shoulder ER YTB x 20 ; Stretches:  Seated Hamstring stretch 30 sec x 3 bil;   LTR for back and hip rotation x 15;  Neuromuscular Re-education:  Manual Therapy:   Manual ROM for c-spine for SB and rotation,  SOR; long leg distraction for R hip, manual hip flex, ER and IR stretch for R hip.      PATIENT EDUCATION: Education details: reviewed HEP Person educated: Patient Education method: Explanation, Demonstration, Tactile cues, Verbal cues, and Handouts Education comprehension: verbalized understanding, returned demonstration, verbal cues required, tactile cues required, and needs further education    HOME EXERCISE PROGRAM: Access Code: [email or web address]    ASSESSMENT:  CLINICAL IMPRESSION:  03/10/22:   Pt doing better with HEP compliance. He does have variable soreness in neck and back. Neck pain decreased from start of care, but still present , with symptoms of occipital neuralgia. Reviewed importance of core strength and back mobility today. Also reviewed use of vibration for neuropathy of hands and feet. Plan to see pt 1-2 more visits and work towards d/c to HEP.   Eval: Patient presents with primary complaint of increased pain in cervical region. He has soreness in R high cervical region, increased with activity and ROM. He has decreased ROM and poor postural awareness. Pt also has several other areas of pain. R hip/glute with increased pain and muscle tension. He has pain in bil knees, and low back. He also has neuropathy that effects balance. Gait/balance to be further assessed in future. Pt to benefit from skilled PT to improve deficits and pain.    OBJECTIVE IMPAIRMENTS: decreased activity tolerance, decreased balance, decreased knowledge of use of DME, decreased mobility, difficulty walking, decreased ROM,  decreased strength, increased muscle spasms, impaired flexibility, improper body mechanics, and pain.   ACTIVITY LIMITATIONS: lifting, bending, standing, squatting, stairs, and locomotion level  PARTICIPATION LIMITATIONS: cleaning, driving, shopping, and community activity  PERSONAL FACTORS: 1-2 comorbidities: multiple pain locations, OA,   are also affecting patient's functional outcome.   REHAB POTENTIAL: Good  CLINICAL DECISION MAKING: Evolving/moderate complexity  EVALUATION COMPLEXITY: Moderate  GOALS: Goals reviewed with patient? Yes  SHORT  TERM GOALS: Target date: 02/02/2022    Pt to be independent with HEP  Goal status: MET  2.  Pt to report decreased pain in Neck to 0-3/10    Goal status: IN PROGRESS    LONG TERM GOALS: Target date: 03/16/2022     1.Pt to be independent with final HEP, for neck, knees and back pain   Goal status: INITIAL  2.  Pt to demo ability to self correct posture at least 50 % of the time in clinic.    Goal status: INITIAL  3.  Pt to demo ability for full ROM for c-spine, without pain greater than 2/10, to improve ability for driving and ADLs.   Goal status: INITIAL  4.  Pt to report decreased pain in R hip/glute to 0-3/10 with activity, to improve ability for IADLs.   Goal status: INITIAL    PLAN: PT FREQUENCY: 2x/week  PT DURATION: 8 weeks  PLANNED INTERVENTIONS: Therapeutic exercises, Therapeutic activity, Neuromuscular re-education, Balance training, Gait training, Patient/Family education, Self Care, Joint mobilization, Joint manipulation, Stair training, Orthotic/Fit training, DME instructions, Dry Needling, Electrical stimulation, Spinal manipulation, Spinal mobilization, Cryotherapy, Moist heat, Vasopneumatic device, Traction, Ultrasound, Ionotophoresis 31m/ml Dexamethasone, and Manual therapy  PLAN FOR NEXT SESSION:   manual for neck, R side muscle tension relief,  R glute muscles,  HEP for back, hip, knees when able.     LLyndee Hensen PT, DPT 9:49 AM  03/10/22  PHYSICAL THERAPY DISCHARGE SUMMARY  Visits from Start of Care: 10 Plan: Patient agrees to discharge.  Patient goals were partially met. Patient is being discharged due to - not returning since last visit.    LLyndee Hensen PT, DPT 12:49 PM  04/16/22

## 2022-03-12 ENCOUNTER — Encounter: Payer: Self-pay | Admitting: Family Medicine

## 2022-03-12 ENCOUNTER — Other Ambulatory Visit: Payer: Self-pay | Admitting: Family Medicine

## 2022-03-12 ENCOUNTER — Ambulatory Visit: Payer: PPO | Admitting: Family Medicine

## 2022-03-12 VITALS — BP 110/60 | HR 79 | Ht 66.0 in | Wt 209.0 lb

## 2022-03-12 DIAGNOSIS — M5136 Other intervertebral disc degeneration, lumbar region: Secondary | ICD-10-CM | POA: Diagnosis not present

## 2022-03-12 DIAGNOSIS — M7062 Trochanteric bursitis, left hip: Secondary | ICD-10-CM | POA: Diagnosis not present

## 2022-03-12 DIAGNOSIS — M542 Cervicalgia: Secondary | ICD-10-CM

## 2022-03-12 MED ORDER — HORIZANT 600 MG PO TBCR
600.0000 mg | EXTENDED_RELEASE_TABLET | Freq: Every day | ORAL | 1 refills | Status: DC
Start: 2022-03-12 — End: 2022-04-27

## 2022-03-12 MED ORDER — KETOROLAC TROMETHAMINE 60 MG/2ML IM SOLN
60.0000 mg | Freq: Once | INTRAMUSCULAR | Status: AC
  Administered 2022-03-12: 60 mg via INTRAMUSCULAR

## 2022-03-12 NOTE — Assessment & Plan Note (Signed)
Toradol injection given today again for this exacerbation of pain.  Patient also we will see if we can get him Horizant instead of the gabapentin to see if we can do this to for more extended release.  Discussed with patient icing regimen and home exercises otherwise.  Follow-up with me again in 6 to 8 weeks

## 2022-03-12 NOTE — Patient Instructions (Signed)
Good to see you  Injection in back side Try horizant '600mg'$  at night Discontinue gabapentin if you are able to get this filled  Follow up in 2 months

## 2022-03-18 DIAGNOSIS — J3089 Other allergic rhinitis: Secondary | ICD-10-CM | POA: Diagnosis not present

## 2022-03-18 DIAGNOSIS — J3081 Allergic rhinitis due to animal (cat) (dog) hair and dander: Secondary | ICD-10-CM | POA: Diagnosis not present

## 2022-03-18 DIAGNOSIS — J301 Allergic rhinitis due to pollen: Secondary | ICD-10-CM | POA: Diagnosis not present

## 2022-03-19 ENCOUNTER — Other Ambulatory Visit: Payer: Self-pay | Admitting: *Deleted

## 2022-03-19 DIAGNOSIS — E785 Hyperlipidemia, unspecified: Secondary | ICD-10-CM

## 2022-03-20 ENCOUNTER — Encounter: Payer: PPO | Admitting: Physical Therapy

## 2022-03-20 DIAGNOSIS — M10371 Gout due to renal impairment, right ankle and foot: Secondary | ICD-10-CM | POA: Diagnosis not present

## 2022-03-20 DIAGNOSIS — M792 Neuralgia and neuritis, unspecified: Secondary | ICD-10-CM | POA: Diagnosis not present

## 2022-03-20 DIAGNOSIS — M19071 Primary osteoarthritis, right ankle and foot: Secondary | ICD-10-CM | POA: Diagnosis not present

## 2022-03-24 ENCOUNTER — Telehealth: Payer: Self-pay | Admitting: Family Medicine

## 2022-03-24 ENCOUNTER — Ambulatory Visit: Payer: PPO | Admitting: Sports Medicine

## 2022-03-24 NOTE — Telephone Encounter (Signed)
Reasonable for visit in that time frame- suspect steroid related side effects

## 2022-03-24 NOTE — Telephone Encounter (Signed)
Patient not feeling himself, doing things he normally does not do, feels very dizzy and disoriented - bp normal of 144/75 -

## 2022-03-24 NOTE — Telephone Encounter (Signed)
Seeing sam worley tomorrow 03/26/21 at 1 pm   Patient Name: Joshua [name] Gender: Male DOB: [birth year] Age: 82 Y 4 M 10 D Return Phone Number: [phone] (Primary), [phone] (Secondary) Address: City/ State/ Zip: [place]  [address] Client [facility] [place] [facility] Provider [name]- MD Contact Type Call Who Is Calling Patient / Member / Family / Caregiver Call Type Triage / Clinical Caller Name Leda Quail Relationship To Patient Other Return Phone Number [phone] (Primary) Chief Complaint Dizziness Reason for Call Symptomatic / Request for Health Information Initial Comment The caller states the pt is not feeling well and does things he doesn't normally do. He received a shot for steroids. Symptoms include dizziness, Translation No Nurse Assessment Nurse: Fredderick Phenix, RN, Lelan Pons Date/Time Eilene Ghazi Time): 03/24/2022 12:52:14 PM Confirm and document reason for call. If symptomatic, describe symptoms. ---The caller states the pt is not feeling well and does things he doesn't normally do. He received a steriod shot Friday. He's symptoms include dizziness and a little confusion. He feels kind of dopey this morning, he didn't have breakfast with his wife this morning. Does the patient have any new or worsening symptoms? ---Yes Will a triage be completed? ---Yes Related visit to physician within the last 2 weeks? ---No Does the PT have any chronic conditions? (i.e. diabetes, asthma, this includes High risk factors for pregnancy, etc.) ---Unknown Is this a behavioral health or substance abuse call? ---No Guidelines Guideline Title Affirmed Question Affirmed Notes Nurse Date/Time (Eastern Time) Dizziness - Lightheadedness [1] MODERATE dizziness (e.g., interferes with normal activities) AND [2] has NOT been evaluated by Fredderick Phenix, RN, Marie 03/24/2022 12:57:03 PM PLEASE NOTE: All  timestamps contained within this report are represented as [place] Standard Time. CONFIDENTIALTY NOTICE: This fax transmission is intended only for the addressee. It contains information that is legally privileged, confidential or otherwise protected from use or disclosure. If you are not the intended recipient, you are strictly prohibited from reviewing, disclosing, copying using or disseminating any of this information or taking any action in reliance on or regarding this information. If you have received this fax in error, please notify us immediately by telephone so that we can arrange for its return to [place]. Phone: [phone], Toll-Free: [phone], Fax: [phone] Page: 2 of 2 Call Id: [id number] Guidelines Guideline Title Affirmed Question Affirmed Notes Nurse Date/Time Eilene Ghazi Time) doctor (or NP/PA) for this (Exception: Dizziness caused by heat exposure, sudden standing, or poor fluid intake.) Disp. Time Eilene Ghazi Time) Disposition Final User 03/24/2022 12:59:19 PM See PCP within 24 Hours Yes Fredderick Phenix, RN, Lelan Pons Final Disposition 03/24/2022 12:59:19 PM See PCP within 24 Hours Yes Fredderick Phenix, RN, [place] Disagree/Comply Comply Caller Understands Yes PreDisposition Did not know what to do Care Advice Given Per Guideline SEE PCP WITHIN 24 HOURS: * IF OFFICE WILL BE OPEN: You need to be examined within the next 24 hours. Call your doctor (or NP/PA) when the office opens and make an appointment. CARE ADVICE given per Dizziness (Adult) guideline. CALL BACK IF: Referrals REFERRED TO PCP OFFICE

## 2022-03-25 ENCOUNTER — Encounter: Payer: Self-pay | Admitting: Physician Assistant

## 2022-03-25 ENCOUNTER — Ambulatory Visit: Payer: PPO | Admitting: Physician Assistant

## 2022-03-25 ENCOUNTER — Ambulatory Visit (INDEPENDENT_AMBULATORY_CARE_PROVIDER_SITE_OTHER): Payer: PPO | Admitting: Physician Assistant

## 2022-03-25 VITALS — BP 110/60 | HR 91 | Temp 97.5°F | Ht 66.0 in | Wt 210.5 lb

## 2022-03-25 DIAGNOSIS — R5381 Other malaise: Secondary | ICD-10-CM

## 2022-03-25 DIAGNOSIS — R739 Hyperglycemia, unspecified: Secondary | ICD-10-CM

## 2022-03-25 DIAGNOSIS — R5383 Other fatigue: Secondary | ICD-10-CM | POA: Diagnosis not present

## 2022-03-25 DIAGNOSIS — R42 Dizziness and giddiness: Secondary | ICD-10-CM | POA: Diagnosis not present

## 2022-03-25 LAB — COMPREHENSIVE METABOLIC PANEL
ALT: 19 U/L (ref 0–53)
AST: 18 U/L (ref 0–37)
Albumin: 4 g/dL (ref 3.5–5.2)
Alkaline Phosphatase: 57 U/L (ref 39–117)
BUN: 23 mg/dL (ref 6–23)
CO2: 27 mEq/L (ref 19–32)
Calcium: 10 mg/dL (ref 8.4–10.5)
Chloride: 99 mEq/L (ref 96–112)
Creatinine, Ser: 1.33 mg/dL (ref 0.40–1.50)
GFR: 50.98 mL/min — ABNORMAL LOW (ref >60.00)
Glucose, Bld: 122 mg/dL — ABNORMAL HIGH (ref 70–99)
Potassium: 3.7 mEq/L (ref 3.5–5.1)
Sodium: 138 mEq/L (ref 135–145)
Total Bilirubin: 0.8 mg/dL (ref 0.2–1.2)
Total Protein: 6.5 g/dL (ref 6.0–8.3)

## 2022-03-25 LAB — CBC WITH DIFFERENTIAL/PLATELET
Basophils Absolute: 0 10*3/uL (ref 0.0–0.1)
Basophils Relative: 0.5 % (ref 0.0–3.0)
Eosinophils Absolute: 0.4 10*3/uL (ref 0.0–0.7)
Eosinophils Relative: 4.7 % (ref 0.0–5.0)
HCT: 42.7 % (ref 39.0–52.0)
Hemoglobin: 14.9 g/dL (ref 13.0–17.0)
Lymphocytes Relative: 21.7 % (ref 12.0–46.0)
Lymphs Abs: 1.6 10*3/uL (ref 0.7–4.0)
MCHC: 34.9 g/dL (ref 30.0–36.0)
MCV: 89.3 fl (ref 78.0–100.0)
Monocytes Absolute: 0.6 10*3/uL (ref 0.1–1.0)
Monocytes Relative: 7.8 % (ref 3.0–12.0)
Neutro Abs: 4.9 10*3/uL (ref 1.4–7.7)
Neutrophils Relative %: 65.3 % (ref 43.0–77.0)
Platelets: 240 10*3/uL (ref 150.0–400.0)
RBC: 4.78 Mil/uL (ref 4.22–5.81)
RDW: 13 % (ref 11.5–15.5)
WBC: 7.6 10*3/uL (ref 4.0–10.5)

## 2022-03-25 LAB — VITAMIN B12: Vitamin B-12: 850 pg/mL (ref 211–911)

## 2022-03-25 LAB — TSH: TSH: 0.82 u[IU]/mL (ref 0.35–5.50)

## 2022-03-25 LAB — HEMOGLOBIN A1C: Hgb A1c MFr Bld: 6.1 % (ref 4.6–6.5)

## 2022-03-25 NOTE — Progress Notes (Signed)
Joshua [name] is a 82 y.o. male here for a new problem.  History of Present Illness:   Chief Complaint  Patient presents with   Fatigue    Pt c/o feeling tired, off balance and some dizziness since steroid shot last Friday 03/13/2022.    HPI  Fatigue Patient reports waking up with gout pain in his ankle x5 days ago- he saw podiatry that day and received intra-articular cortisone injection to the right foot with Dr. Waldron Session. He has had many cortisone injections in the past in various joints. He reports generalized weakness, difficulty sleeping, and feeling "out of it" with steroids in the past. He states Dr. Waldron Session gave him a smaller dose of the steroid last week given his prior reactions.  Complains of room-spinning dizziness intermittently unless sitting watching TV or using computer. Symptoms started x2 days after receiving the cortisone injection. He states that he has been dealing with vertigo for some time which started well before the injection. He took Dramamine yesterday with improvement. He did some therapy for his vertigo in 2016 but no vestibular maneuvers were done at that time.  Accompanying mild blurred vision bilaterally since yesterday. x2 days before the injection he saw a bright light in his left eye twice while his eyes were closed. The bright light went away when he opened his eyes.  He reports having fatigue and feeling foggy intermittently x2 days ago. Patient was able to drive himself to and from a party that afternoon in the dark without difficulty. He played cards at the party and had no difficulty focusing. His foggy headedness has improved but his fatigue has not.  Reports normal appetite for him. He did cut out red meat last week at the recommendation of Dr. Waldron Session. He states that he only drinks unsweet iced tea and coffee throughout the day- rarely drinks plain water.  He notes taking Valium once last week due to an episode of difficulty  sleeping. He states that he wakes up in the middle of the night and removes his CPAP because he does not tolerate it well. He gets up and is able to sleep through the night on the couch.  Denies any chest pain, shortness of breath, focal weakness, numbness/tingling, headaches, URI symptoms, recent illnesses, or leg swelling.  Patient reports that he has felt down recently. He is taking Buspar occasionally for this. He denies any missed doses of his daily medications.  Past Medical History:  Diagnosis Date   Acute medial meniscal tear    Anemia 2015   Arthritis    "middle finger right hand; right knee; neck" (02/06/2014)   Asthma    seasonal   Bladder cancer ([place]) 04/2010   "cauterized during prostate OR"   CAD (coronary artery disease)    CABG 2001   Diverticulosis    DJD (degenerative joint disease)    BACK   GERD (gastroesophageal reflux disease)    H/O inguinal hernia repair 12/2018   History of gout    Hyperlipidemia    Hypertension    IBS (irritable bowel syndrome)    Obesity    OSA (obstructive sleep apnea)    USES CPAP    Pancreatitis ~ 1980   Peripheral vascular disease ([place])    Pre-diabetes    Prostate cancer ([place]) 04/2010     Social History   Tobacco Use   Smoking status: Former    Packs/day: 1.00    Years: 35.00    Total pack years: 35.00  Types: Cigarettes    Quit date: 06/16/1992    Years since quitting: 29.7   Smokeless tobacco: Never  Vaping Use   Vaping Use: Never used  Substance Use Topics   Alcohol use: Yes    Alcohol/week: 2.0 standard drinks of alcohol    Types: 2 Shots of liquor per week    Comment: occ   Drug use: No    Past Surgical History:  Procedure Laterality Date   ANTERIOR CERVICAL DECOMP/DISCECTOMY FUSION  05/26/2006   PLATE PLACED   BACK SURGERY     CARDIAC CATHETERIZATION  11/20/1999   CORONARY ARTERY BYPASS GRAFT  11/20/1999   SVG-RI1-RI2, SVG-OM, SVG-dRCA   CORONARY STENT INTERVENTION N/A 04/13/2019   Procedure:  CORONARY STENT INTERVENTION;  Surgeon: Burnell Blanks, MD;  Location: [facility];  Service: Cardiovascular;  Laterality: N/A;   HERNIA REPAIR     INGUINAL HERNIA REPAIR Left 01/02/2019   Procedure: OPEN LEFT INGUINAL HERNIA REPAIR WITH MESH;  Surgeon: Johnathan Hausen, MD;  Location: [facility];  Service: General;  Laterality: Left;   KNEE ARTHROSCOPY Right 1992; 04/2008   LAPAROSCOPIC CHOLECYSTECTOMY  2007   LEFT HEART CATH AND CORS/GRAFTS ANGIOGRAPHY N/A 04/13/2019   Procedure: LEFT HEART CATH AND CORS/GRAFTS ANGIOGRAPHY;  Surgeon: Burnell Blanks, MD;  Location: [facility];  Service: Cardiovascular;  Laterality: N/A;   NASAL SEPTUM SURGERY Right 2007   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   ROBOT ASSISTED LAPAROSCOPIC RADICAL PROSTATECTOMY  04/2010   THYROIDECTOMY, PARTIAL  1980   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   Procedure: LEFT TOTAL KNEE ARTHROPLASTY;  Surgeon: Melrose Nakayama, MD;  Location: [facility];  Service: Orthopedics;  Laterality: Left;    Family History  Problem Relation Age of Onset   Heart disease Mother    Hyperlipidemia Mother    Heart disease Father    Hyperlipidemia Father    Diabetes Brother    Colon cancer Neg Hx    Pancreatic cancer Neg Hx    Esophageal cancer Neg Hx    Stomach cancer Neg Hx    Liver cancer Neg Hx     Allergies  Allergen Reactions   Benadryl [Diphenhydramine] Other (See Comments)    Pt told not to take because of bypass surgery   Bromfed Nausea And Vomiting   Clarithromycin Other (See Comments)     gastritis   Codeine Other (See Comments)    Trouble breathing   Cymbalta [Duloxetine Hcl]     Abdominal pain and later chest pain as well as worsening fatigue    Doxycycline Hyclate Nausea And Vomiting   Lexapro [Escitalopram]     Dizziness   Modafinil Other (See Comments)     anxiety-nervousness   Oxycodone-Acetaminophen Itching   Promethazine Hcl Other (See Comments)     fainting   Quinolones Nausea Only     Cipro "felt real bad"   Repatha [Evolocumab]     Weak, worn out   Telithromycin Nausea And Vomiting   Tramadol Nausea Only   Hydrocodone-Acetaminophen Rash   Statins Other (See Comments)    Leg cramps and makes patient feel bad. Per pt tried multiple in years past   Sulfonamide Derivatives Rash    Current Medications:   Current Outpatient Medications:    acetaminophen (TYLENOL) 500 MG tablet, Take 1,000 mg by mouth every 6 (six) hours as needed for moderate pain or headache., Disp: , Rfl:    albuterol (PROAIR HFA) 108 (90 Base) MCG/ACT inhaler, Inhale 1-2  puffs into the lungs every 6 (six) hours as needed for wheezing or shortness of breath., Disp: 18 g, Rfl: 5   Alirocumab (PRALUENT) 150 MG/ML SOAJ, INJECT 1 DOSE INTO THE SKIN EVERY 14 (FOURTEEN) DAYS., Disp: 6 mL, Rfl: 3   amLODipine (NORVASC) 5 MG tablet, TAKE 1 TABLET (5 MG TOTAL) BY MOUTH DAILY., Disp: 90 tablet, Rfl: 3   aspirin EC 81 MG tablet, Take 1 tablet (81 mg total) by mouth 2 (two) times daily after a meal., Disp: 30 tablet, Rfl: 11   cholecalciferol (VITAMIN D3) 25 MCG (1000 UNIT) tablet, Take 1,000 Units by mouth daily., Disp: , Rfl:    COVID-19 mRNA vaccine 2023-2024 (COMIRNATY) syringe, Inject into the muscle., Disp: 0.3 mL, Rfl: 0   Cyanocobalamin (B-12) 2500 MCG TABS, Take 1,250 mcg by mouth daily., Disp: , Rfl:    diazepam (VALIUM) 5 MG tablet, TAKE 1 TABLET (5 MG TOTAL) BY MOUTH AT BEDTIME AS NEEDED (SLEEP)., Disp: 30 tablet, Rfl: 1   EPINEPHrine 0.3 mg/0.3 mL IJ SOAJ injection, Inject 0.3 mg into the muscle as needed for anaphylaxis. for allergic reaction, Disp: , Rfl:    ezetimibe (ZETIA) 10 MG tablet, TAKE 1 TABLET BY MOUTH EVERY DAY, Disp: 90 tablet, Rfl: 3   FLOVENT HFA 110 MCG/ACT inhaler, Inhale 1 puff into the lungs 2 (two) times daily. , Disp: , Rfl: 5   fluticasone (FLONASE) 50 MCG/ACT nasal spray, Place 1 spray into both nostrils 2 (two) times daily. , Disp: , Rfl:    folic acid (FOLVITE) 528 MCG  tablet, Take 800 mcg by mouth daily. , Disp: , Rfl:    gabapentin (NEURONTIN) 300 MG capsule, Take 1 cap in AM, 1 cap at noon, 3 caps at bedtime (Patient taking differently: Take 2 cap in AM, 1 cap at noon, 2caps at bedtime), Disp: 450 capsule, Rfl: 3   Gabapentin Enacarbil (HORIZANT) 600 MG TBCR, Take 1 tablet (600 mg total) by mouth at bedtime., Disp: 30 tablet, Rfl: 1   GEMTESA 75 MG TABS, TAKE 1 TABLET BY MOUTH DAILY., Disp: 30 tablet, Rfl: 11   Glucos-Chond-Hyal Ac-Ca Fructo (MOVE FREE JOINT HEALTH ADVANCE PO), Take 1 tablet by mouth daily., Disp: , Rfl:    hydrochlorothiazide (HYDRODIURIL) 25 MG tablet, TAKE 1 TABLET BY MOUTH EVERY DAY, Disp: 90 tablet, Rfl: 2   ipratropium (ATROVENT HFA) 17 MCG/ACT inhaler, Inhale 2 puffs into the lungs 3 (three) times daily., Disp: 1 each, Rfl: 1   ketoconazole (NIZORAL) 2 % cream, Apply 1 application topically See admin instructions. Twice a day every other day, Disp: , Rfl:    Miconazole Nitrate (LOTRIMIN AF DEODORANT POWDER) 2 % AERP, Apply 1 spray topically daily as needed (jock itch)., Disp: , Rfl:    nitroGLYCERIN (NITROSTAT) 0.4 MG SL tablet, PLACE 1 TABLET UNDER THE TONGUE EVERY 5 (FIVE) MINUTES AS NEEDED., Disp: 75 tablet, Rfl: 1   pantoprazole (PROTONIX) 40 MG tablet, TAKE 1 TABLET BY MOUTH EVERY DAY, Disp: 90 tablet, Rfl: 3   polyethylene glycol powder (GLYCOLAX/MIRALAX) powder, Take 17 g by mouth daily., Disp: 255 g, Rfl: 0   valsartan (DIOVAN) 320 MG tablet, TAKE 1 TABLET BY MOUTH EVERY DAY, Disp: 90 tablet, Rfl: 3   Wheat Dextrin (BENEFIBER) POWD, Take 1 Dose by mouth daily. 1 dose = 2 teaspoons, Disp: , Rfl:    Review of Systems:   Review of Systems  Constitutional:  Positive for malaise/fatigue.  HENT:  Negative for congestion, ear discharge, ear pain and  sore throat.   Eyes:  Positive for blurred vision.  Respiratory:  Negative for cough and shortness of breath.   Cardiovascular:  Negative for chest pain and leg swelling.   Musculoskeletal:  Negative for falls.  Neurological:  Positive for dizziness and weakness. Negative for tingling, focal weakness and headaches.  Psychiatric/Behavioral:  The patient has insomnia.     Vitals:   Vitals:   03/25/22 1308  BP: 110/60  Pulse: 91  Temp: (!) 97.5 F (36.4 C)  TempSrc: Temporal  SpO2: 94%  Weight: 210 lb 8 oz (95.5 kg)  Height: '5\' 6"'$  (1.676 m)     Body mass index is 33.98 kg/m.  Physical Exam:   Physical Exam Vitals and nursing note reviewed.  Constitutional:      General: He is not in acute distress.    Appearance: He is well-developed. He is not ill-appearing or toxic-appearing.  Cardiovascular:     Rate and Rhythm: Normal rate and regular rhythm.     Pulses: Normal pulses.     Heart sounds: Normal heart sounds, S1 normal and S2 normal.  Pulmonary:     Effort: Pulmonary effort is normal.     Breath sounds: Normal breath sounds.  Skin:    General: Skin is warm and dry.  Neurological:     General: No focal deficit present.     Mental Status: He is alert.     GCS: GCS eye subscore is 4. GCS verbal subscore is 5. GCS motor subscore is 6.     Cranial Nerves: Cranial nerves 2-12 are intact.     Sensory: Sensation is intact.     Motor: Motor function is intact.     Coordination: Coordination is intact.     Gait: Gait is intact.  Psychiatric:        Speech: Speech normal.        Behavior: Behavior normal. Behavior is cooperative.     Assessment and Plan:   Malaise and fatigue Unclear etiology Possibly 2/2 recent steroid injection -- he has significant hx of sensitivities to medications in the past I also suspect symptoms may be related to dehydration and inconsistent use of CPAP Neuro exam is WNL Update blood work Push fluids - 64 oz water daily Use CPAP as able Recommend that he follow-up with eye doctor regarding his concern for vision changes If new/worsening sx, recommend ER  Elevated blood sugar Update A1c today and provide  recommendations accordingly  Vertigo Neuro exam benign I did not feel comfortable completing Epley Maneuver on patient, but will send to vestibular rehab for further evaluation Consider reaching out to Dr Delice Lesch for evaluation (he is already established with her) to assess if sx change  I,Alexis Herring,acting as a Education administrator for [employer], PA.,have documented all relevant documentation on the behalf of Inda Coke, PA,as directed by  Inda Coke, PA while in the presence of Inda Coke, [place].  I, Inda Coke, [place], have reviewed all documentation for this visit. The documentation on 03/25/22 for the exam, diagnosis, procedures, and orders are all accurate and complete.  Time spent with patient today was 70 minutes which consisted of chart review, discussing diagnosis, work up, treatment answering questions and documentation.  Inda Coke, PA-C

## 2022-03-25 NOTE — Telephone Encounter (Signed)
Pt is scheduled today at 1:00 PM.

## 2022-03-25 NOTE — Patient Instructions (Signed)
It was great to see you!  Drink 64 oz water daily  Blood work today  Vestibular rehab PT will be ordered, you will be contacted for this  Schedule an eye exam  If any new/worsening symptoms, please go to the ER  Take care,  Inda Coke PA-C

## 2022-03-30 ENCOUNTER — Other Ambulatory Visit: Payer: Self-pay | Admitting: Internal Medicine

## 2022-03-30 DIAGNOSIS — L282 Other prurigo: Secondary | ICD-10-CM

## 2022-04-01 ENCOUNTER — Encounter: Payer: PPO | Admitting: Physical Therapy

## 2022-04-01 ENCOUNTER — Telehealth: Payer: Self-pay | Admitting: Cardiology

## 2022-04-01 ENCOUNTER — Other Ambulatory Visit: Payer: Self-pay | Admitting: Family Medicine

## 2022-04-01 ENCOUNTER — Other Ambulatory Visit: Payer: Self-pay | Admitting: Internal Medicine

## 2022-04-01 DIAGNOSIS — J3089 Other allergic rhinitis: Secondary | ICD-10-CM | POA: Diagnosis not present

## 2022-04-01 DIAGNOSIS — J301 Allergic rhinitis due to pollen: Secondary | ICD-10-CM | POA: Diagnosis not present

## 2022-04-01 DIAGNOSIS — J3081 Allergic rhinitis due to animal (cat) (dog) hair and dander: Secondary | ICD-10-CM | POA: Diagnosis not present

## 2022-04-01 NOTE — Telephone Encounter (Signed)
Calling in regards to the medication Alirocumab ([place]) 150 MG/ML SOAJ  and the foundation. Please advise

## 2022-04-02 ENCOUNTER — Encounter: Payer: Self-pay | Admitting: Family Medicine

## 2022-04-02 ENCOUNTER — Ambulatory Visit (INDEPENDENT_AMBULATORY_CARE_PROVIDER_SITE_OTHER): Payer: PPO | Admitting: Family Medicine

## 2022-04-02 VITALS — BP 112/64 | HR 84 | Temp 97.3°F | Ht 66.0 in | Wt 211.4 lb

## 2022-04-02 DIAGNOSIS — L282 Other prurigo: Secondary | ICD-10-CM | POA: Diagnosis not present

## 2022-04-02 DIAGNOSIS — G629 Polyneuropathy, unspecified: Secondary | ICD-10-CM

## 2022-04-02 DIAGNOSIS — N183 Chronic kidney disease, stage 3 unspecified: Secondary | ICD-10-CM | POA: Diagnosis not present

## 2022-04-02 DIAGNOSIS — R739 Hyperglycemia, unspecified: Secondary | ICD-10-CM | POA: Diagnosis not present

## 2022-04-02 DIAGNOSIS — I10 Essential (primary) hypertension: Secondary | ICD-10-CM

## 2022-04-02 MED ORDER — DIAZEPAM 5 MG PO TABS: 5.0000 mg | ORAL_TABLET | Freq: Every evening | ORAL | 1 refills | Status: DC | PRN

## 2022-04-02 MED ORDER — CLOBETASOL PROPIONATE 0.05 % EX CREA: TOPICAL_CREAM | CUTANEOUS | 2 refills | Status: DC

## 2022-04-02 NOTE — Patient Instructions (Addendum)
I agree that there are risks to gabapentin- with that being said we are also trying to balance those with quality of life/pain control- the other available medicine carry similar or more substantial risk so I do not see a great option to switch to but [name] did mention seeing you back after a year- so glad you have follow up with her  Thrilled you are doing better  Refilled cream for rash- glad its helping  Refilled diazepam for sleep glad you use so sparingly- this also increase fall risk so be careful and we can cut out if issues in future  Recommended follow up: Return for next already scheduled visit or sooner if needed.

## 2022-04-02 NOTE — Telephone Encounter (Signed)
Renewed Roxanne Gates  ID [id number] expires 12/24/22

## 2022-04-02 NOTE — Progress Notes (Signed)
Phone [phone] In person visit   Subjective:   Joshua [name] is a 82 y.o. year old very pleasant male patient who presents for/with See problem oriented charting Chief Complaint  Patient presents with   Follow-up    Pt has concerns about gabapentin.   Hypertension   Back Pain    Pt c/o constant back pain     Past Medical History-  Patient Active Problem List   Diagnosis Date Noted   Coronary atherosclerosis 10/12/2006    Priority: High   Irritable bowel syndrome 10/12/2006    Priority: High   Overactive bladder 11/24/2019    Priority: Medium    Hyperglycemia 10/07/2017    Priority: Medium    Gout 11/20/2016    Priority: Medium    CKD (chronic kidney disease), stage III ([place]) 10/23/2013    Priority: Medium    Neuropathy ([place]) 08/21/2013    Priority: Medium    History of bladder cancer 04/01/2010    Priority: Medium    Essential hypertension 02/21/2007    Priority: Medium    Hyperlipidemia 10/12/2006    Priority: Medium    Major depression in full remission ([place]) 10/12/2006    Priority: Medium    Asthma 10/12/2006    Priority: Medium    Aortic atherosclerosis ([place]) 05/05/2018    Priority: Low   DJD (degenerative joint disease)     Priority: Low   Arthritis     Priority: Low   Neck pain 02/08/2017    Priority: Low   Venous insufficiency 12/29/2016    Priority: Low   Ganglion cyst of tendon sheath of right hand 12/29/2016    Priority: Low   Avulsion fracture of metatarsal bone of right foot 11/25/2016    Priority: Low   Obesity, Class I, BMI 30-34.9 10/03/2015    Priority: Low   Allergic rhinitis 05/31/2014    Priority: Low   Fatigue 11/09/2013    Priority: Low   Dizziness 08/23/2013    Priority: Low   RLS (restless legs syndrome) 01/03/2013    Priority: Low   Chronic bilateral low back pain with left-sided sciatica 10/09/2011    Priority: Low   OSTEOARTHRITIS, LOWER LEG, LEFT 10/04/2009    Priority: Low   ANEMIA, IRON DEFICIENCY 11/06/2008     Priority: Low   TINNITUS, CHRONIC, 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    Priority: Low   OSA (obstructive sleep apnea) 11/02/2006    Priority: Low   GERD 10/12/2006    Priority: Low   Degenerative cervical disc 01/12/2022   Greater trochanteric bursitis of right hip 02/17/2021   Hyperactive bowel sounds 01/29/2021   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 ([place]) 09/25/2020   Primary osteoarthritis of left knee 06/18/2020   Pre-operative respiratory examination 06/05/2020   Coronary artery disease involving coronary bypass graft of native heart without angina pectoris 04/07/2020   Trigger thumb, left thumb 01/24/2020   Left lateral epicondylitis 09/05/2019   Degenerative arthritis of left knee 08/08/2019   Educated about COVID-19 virus infection 04/20/2019   Unstable angina (HCC)    Greater trochanteric bursitis of left hip 10/27/2018   Degenerative disc disease, lumbar 10/27/2018   Abdominal muscle strain, initial encounter 06/16/2018   Weakness of left leg 06/21/2017   Hereditary and idiopathic peripheral neuropathy 09/25/2014   Generalized abdominal pain 01/31/2009    Medications- reviewed and updated Current Outpatient Medications  Medication Sig Dispense Refill   acetaminophen (TYLENOL) 500 MG tablet Take 1,000 mg by mouth every  6 (six) hours as needed for moderate pain or headache.     albuterol (PROAIR HFA) 108 (90 Base) MCG/ACT inhaler Inhale 1-2 puffs into the lungs every 6 (six) hours as needed for wheezing or shortness of breath. 18 g 5   Alirocumab (PRALUENT) 150 MG/ML SOAJ INJECT 1 DOSE INTO THE SKIN EVERY 14 (FOURTEEN) DAYS. 6 mL 3   amLODipine (NORVASC) 5 MG tablet TAKE 1 TABLET (5 MG TOTAL) BY MOUTH DAILY. 90 tablet 3   aspirin EC 81 MG tablet Take 1 tablet (81 mg total) by mouth 2 (two) times daily after a meal. 30 tablet 11   cholecalciferol (VITAMIN D3) 25 MCG (1000 UNIT) tablet Take 1,000 Units by mouth daily.     Cyanocobalamin (B-12) 2500 MCG TABS  Take 1,250 mcg by mouth daily.     EPINEPHrine 0.3 mg/0.3 mL IJ SOAJ injection Inject 0.3 mg into the muscle as needed for anaphylaxis. for allergic reaction     ezetimibe (ZETIA) 10 MG tablet TAKE 1 TABLET BY MOUTH EVERY DAY 90 tablet 3   FLOVENT HFA 110 MCG/ACT inhaler Inhale 1 puff into the lungs 2 (two) times daily.   5   fluticasone (FLONASE) 50 MCG/ACT nasal spray Place 1 spray into both nostrils 2 (two) times daily.      folic acid (FOLVITE) 606 MCG tablet Take 800 mcg by mouth daily.      gabapentin (NEURONTIN) 300 MG capsule Take 1 cap in AM, 1 cap at noon, 3 caps at bedtime (Patient taking differently: Take 2 cap in AM, 1 cap at noon, 2caps at bedtime) 450 capsule 3   Gabapentin Enacarbil (HORIZANT) 600 MG TBCR Take 1 tablet (600 mg total) by mouth at bedtime. 30 tablet 1   GEMTESA 75 MG TABS TAKE 1 TABLET BY MOUTH EVERY DAY 90 tablet 3   Glucos-Chond-Hyal Ac-Ca Fructo (MOVE FREE JOINT HEALTH ADVANCE PO) Take 1 tablet by mouth daily.     hydrochlorothiazide (HYDRODIURIL) 25 MG tablet TAKE 1 TABLET BY MOUTH EVERY DAY 90 tablet 2   ipratropium (ATROVENT HFA) 17 MCG/ACT inhaler Inhale 2 puffs into the lungs 3 (three) times daily. 1 each 1   ketoconazole (NIZORAL) 2 % cream Apply 1 application topically See admin instructions. Twice a day every other day     Miconazole Nitrate (LOTRIMIN AF DEODORANT POWDER) 2 % AERP Apply 1 spray topically daily as needed (jock itch).     nitroGLYCERIN (NITROSTAT) 0.4 MG SL tablet PLACE 1 TABLET UNDER THE TONGUE EVERY 5 (FIVE) MINUTES AS NEEDED. 75 tablet 1   pantoprazole (PROTONIX) 40 MG tablet TAKE 1 TABLET BY MOUTH EVERY DAY 90 tablet 3   polyethylene glycol powder (GLYCOLAX/MIRALAX) powder Take 17 g by mouth daily. 255 g 0   valsartan (DIOVAN) 320 MG tablet TAKE 1 TABLET BY MOUTH EVERY DAY 90 tablet 3   Wheat Dextrin (BENEFIBER) POWD Take 1 Dose by mouth daily. 1 dose = 2 teaspoons     clobetasol cream (TEMOVATE) 0.05 % APPLY TO AFFECTED AREA TWICE A DAY  up to 7 days maximum 60 g 2   diazepam (VALIUM) 5 MG tablet Take 1 tablet (5 mg total) by mouth at bedtime as needed (sleep). 30 tablet 1   No current facility-administered medications for this visit.     Objective:  BP 112/64   Pulse 84   Temp (!) 97.3 F (36.3 C)   Ht '5\' 6"'$  (1.676 m)   Wt 211 lb 6.4 oz (95.9 kg)  SpO2 100%   BMI 34.12 kg/m  Gen: NAD, resting comfortably CV: RRR no murmurs rubs or gallops Lungs: CTAB no crackles, wheeze, rhonchi Ext: no edema Skin: warm, dry     Assessment and Plan   # Worsening fatigue-noted after intra-articular cortisone injection to right foot with Dr. Mirna Mires generalized weakness, difficulty sleeping and feeling out of it with steroids in the past.  They tried to reduce the dose to minimize issues -Was also having some vertigo worsening from baseline after injection along with some mild blurred vision (had been recommended to follow-up with ophthalmology) -For vertigo was sent to vestibular rehab -Labs were reassuring with no obvious cause of fatigue -There was some concern that symptoms could be related to dehydration and inconsistent use of CPAP.  Had reassuring neurological exam - he is feeling much better. Feels improved after steroid wore off.  -didn't have to see PT as vertigo resolved on its own  # Back Pain S:ongoing issues with back pain-works with Dr. Tamala Julian  A/P: manageable but annoying- wants to monitor with meds -diazepam helps with sleep- wonder if muscle relaxant portoin helps but uses sparingly- refill provided- discussed fall risk  #Rash- intermittent spots in groin x3 and then on upper abdomen- he ended up seeing DR. Jones who said it was an allergic reaction likely. He said clobetasol was right choice (Dr. Randol Kern had prescribed from our office)   #Hypertension/CKD stage III S: Compliant with amlodipine 5 mg, hydrochlorothiazide 25 mg and valsartan '320mg'$  -GFR has been stable in the 50s.  On ARB in case  proteinuric element BP Readings from Last 3 Encounters:  04/02/22 112/64  03/25/22 110/60  03/12/22 110/60   A/P: hypertension - stable- continue current medicines   CKD- stable on last check  % Neuropathy-follows with [name] who recommended 1 year follow-up from March S: Compliant with gabapentin 300 mg twice daily without 100 mg before bed.  Burning/needle pain in the feet-and is also noted in his hands -Recent B12 and TSH normal.  A1c up to 6.1 on March 25, 2022 A/P: has follow up planned with [name]- discussed concerns he had about gabapentin- [facility]- but dont see great alternative. Had tried cymbalta in past   #Hyperglycemia history of mildly elevated A1c.  Prediabetes range worsening some. Tries to avoid added sugars. Has seen dietician in the past.  Lab Results  Component Value Date   HGBA1C 6.1 03/25/2022   HGBA1C 5.7 05/13/2021   HGBA1C 5.7 02/06/2021   Recommended follow up: Return for next already scheduled visit or sooner if needed. Future Appointments  Date Time Provider [place]  04/27/2022 10:00 AM Cameron Sprang, MD [facility] None  05/12/2022  9:15 AM Lyndal Pulley, DO [facility] None  06/04/2022  8:45 AM [facility] HEALTH COACH [facility]  07/03/2022  9:00 AM [name], MD [facility]  08/31/2022  2:30 PM [facility] CCM PHARMACIST [facility]   Lab/Order associations:   ICD-10-CM   1. Essential hypertension  I10     2. Stage 3 chronic kidney disease, unspecified whether stage 3a or 3b CKD (HCC)  N18.30     3. Hyperglycemia  R73.9     4. Pruritic rash  L28.2 clobetasol cream (TEMOVATE) 0.05 %    5. Neuropathy ([place])  G62.9      Meds ordered this encounter  Medications   clobetasol cream (TEMOVATE) 0.05 %    Sig: APPLY TO AFFECTED AREA TWICE A DAY up to 7 days maximum  Dispense:  60 g    Refill:  2    rash   diazepam (VALIUM) 5 MG tablet    Sig: Take 1 tablet (5 mg total) by mouth at bedtime as needed (sleep).     Dispense:  30 tablet    Refill:  1    This request is for a new prescription for a controlled substance as required by Federal/State law.    Return precautions advised.  [name], MD

## 2022-04-07 NOTE — Telephone Encounter (Signed)
Dr.Hunter has already taken care of this.

## 2022-04-08 ENCOUNTER — Encounter: Payer: Self-pay | Admitting: Family

## 2022-04-08 ENCOUNTER — Ambulatory Visit (INDEPENDENT_AMBULATORY_CARE_PROVIDER_SITE_OTHER): Payer: PPO | Admitting: Family

## 2022-04-08 VITALS — BP 126/77 | HR 75 | Temp 97.1°F | Ht 66.0 in | Wt 211.4 lb

## 2022-04-08 DIAGNOSIS — L989 Disorder of the skin and subcutaneous tissue, unspecified: Secondary | ICD-10-CM

## 2022-04-08 NOTE — Patient Instructions (Signed)
It was very nice to see you today!  Stop using   Wash with a Hypoallergenic body wash, ok to use dial soap in your private area.  Dry off well.  Apply a small Q-tip size of the Bacitracin ointment I gave you to the sore on your left groin once a day.  Then cover with non-stick or regular gauze to protect from germs and friction. OK to continue using the antifungal powder in the crease folds of your groin and under belly.  Keep appt with dermatology end of month.       PLEASE NOTE:  If you had any lab tests please let us know if you have not heard back within a few days. You may see your results on MyChart before we have a chance to review them but we will give you a call once they are reviewed by [place]. If we ordered any referrals today, please let us know if you have not heard from their office within the next week.

## 2022-04-08 NOTE — Progress Notes (Signed)
Patient ID: Joshua [name], male    DOB: 03-19-1941, 82 y.o.   MRN: [id number]  Chief Complaint  Patient presents with   Rash    Pt c/o rash recurring on different area of his body. Has tried clobetasol which did not help. Rash has shrinked and has brown spots.     HPI: Persistent rash:  pt seen by PCP recently for similar rash described as intermittent spots in groin x3 and then on upper abdomen- he ended up seeing DR. Jones who said it was an allergic reaction likely. He said clobetasol was right choice. pt has used antifungals in past with no relief. But does get some relief from antifungal powders. Wears depends which can be irritating. Reports the first lesion started under umbilicus, then moved to right groin, then had under left breast and now has it on left side of groin, starts as bright red and then turns brown.   Assessment & Plan:  1. Skin lesion - approx. 1.5cm in diameter open ulcer/sore with serous drg. Advised pt to stop using the Clobetasol cream on this lesion. Advised to use the Bacitracin daily, just a small, Q-tip size after washing with soap & water & drying area well. Cover with a non-stick bandage & paper tape to avoid friction. pt states he has to wear snug briefs to hold his incontinence pads vs loose briefs. Advised to keep appt at end of month with DERM.    Subjective:    Outpatient Medications Prior to Visit  Medication Sig Dispense Refill   acetaminophen (TYLENOL) 500 MG tablet Take 1,000 mg by mouth every 6 (six) hours as needed for moderate pain or headache.     albuterol (PROAIR HFA) 108 (90 Base) MCG/ACT inhaler Inhale 1-2 puffs into the lungs every 6 (six) hours as needed for wheezing or shortness of breath. 18 g 5   Alirocumab (PRALUENT) 150 MG/ML SOAJ INJECT 1 DOSE INTO THE SKIN EVERY 14 (FOURTEEN) DAYS. 6 mL 3   amLODipine (NORVASC) 5 MG tablet TAKE 1 TABLET (5 MG TOTAL) BY MOUTH DAILY. 90 tablet 3   aspirin EC 81 MG tablet Take 1 tablet (81 mg  total) by mouth 2 (two) times daily after a meal. 30 tablet 11   cholecalciferol (VITAMIN D3) 25 MCG (1000 UNIT) tablet Take 1,000 Units by mouth daily.     clobetasol cream (TEMOVATE) 0.05 % APPLY TO AFFECTED AREA TWICE A DAY up to 7 days maximum 60 g 2   Cyanocobalamin (B-12) 2500 MCG TABS Take 1,250 mcg by mouth daily.     diazepam (VALIUM) 5 MG tablet Take 1 tablet (5 mg total) by mouth at bedtime as needed (sleep). 30 tablet 1   EPINEPHrine 0.3 mg/0.3 mL IJ SOAJ injection Inject 0.3 mg into the muscle as needed for anaphylaxis. for allergic reaction     ezetimibe (ZETIA) 10 MG tablet TAKE 1 TABLET BY MOUTH EVERY DAY 90 tablet 3   FLOVENT HFA 110 MCG/ACT inhaler Inhale 1 puff into the lungs 2 (two) times daily.   5   fluticasone (FLONASE) 50 MCG/ACT nasal spray Place 1 spray into both nostrils 2 (two) times daily.      folic acid (FOLVITE) 235 MCG tablet Take 800 mcg by mouth daily.      gabapentin (NEURONTIN) 300 MG capsule Take 1 cap in AM, 1 cap at noon, 3 caps at bedtime (Patient taking differently: Take 2 cap in AM, 1 cap at noon, 2caps at bedtime) 450 capsule  3   Gabapentin Enacarbil (HORIZANT) 600 MG TBCR Take 1 tablet (600 mg total) by mouth at bedtime. 30 tablet 1   GEMTESA 75 MG TABS TAKE 1 TABLET BY MOUTH EVERY DAY 90 tablet 3   Glucos-Chond-Hyal Ac-Ca Fructo (MOVE FREE JOINT HEALTH ADVANCE PO) Take 1 tablet by mouth daily.     hydrochlorothiazide (HYDRODIURIL) 25 MG tablet TAKE 1 TABLET BY MOUTH EVERY DAY 90 tablet 2   ipratropium (ATROVENT HFA) 17 MCG/ACT inhaler Inhale 2 puffs into the lungs 3 (three) times daily. 1 each 1   ketoconazole (NIZORAL) 2 % cream Apply 1 application topically See admin instructions. Twice a day every other day     Miconazole Nitrate (LOTRIMIN AF DEODORANT POWDER) 2 % AERP Apply 1 spray topically daily as needed (jock itch).     nitroGLYCERIN (NITROSTAT) 0.4 MG SL tablet PLACE 1 TABLET UNDER THE TONGUE EVERY 5 (FIVE) MINUTES AS NEEDED. 75 tablet 1    pantoprazole (PROTONIX) 40 MG tablet TAKE 1 TABLET BY MOUTH EVERY DAY 90 tablet 3   polyethylene glycol powder (GLYCOLAX/MIRALAX) powder Take 17 g by mouth daily. 255 g 0   silver sulfADIAZINE (SILVADENE) 1 % cream Apply 1 Application topically daily.     valsartan (DIOVAN) 320 MG tablet TAKE 1 TABLET BY MOUTH EVERY DAY 90 tablet 3   Wheat Dextrin (BENEFIBER) POWD Take 1 Dose by mouth daily. 1 dose = 2 teaspoons     No facility-administered medications prior to visit.   Past Medical History:  Diagnosis Date   Acute medial meniscal tear    Anemia 2015   Arthritis    "middle finger right hand; right knee; neck" (02/06/2014)   Asthma    seasonal   Bladder cancer ([place]) 04/2010   "cauterized during prostate OR"   CAD (coronary artery disease)    CABG 2001   Diverticulosis    DJD (degenerative joint disease)    BACK   GERD (gastroesophageal reflux disease)    H/O inguinal hernia repair 12/2018   History of gout    Hyperlipidemia    Hypertension    IBS (irritable bowel syndrome)    Obesity    OSA (obstructive sleep apnea)    USES CPAP    Pancreatitis ~ 1980   Peripheral vascular disease ([employer])    Pre-diabetes    Prostate cancer ([place]) 04/2010   Past Surgical History:  Procedure Laterality Date   ANTERIOR CERVICAL DECOMP/DISCECTOMY FUSION  05/26/2006   PLATE PLACED   BACK SURGERY     CARDIAC CATHETERIZATION  11/20/1999   CORONARY ARTERY BYPASS GRAFT  11/20/1999   SVG-RI1-RI2, SVG-OM, SVG-dRCA   CORONARY STENT INTERVENTION N/A 04/13/2019   Procedure: CORONARY STENT INTERVENTION;  Surgeon: Burnell Blanks, MD;  Location: [place] [facility];  Service: Cardiovascular;  Laterality: N/A;   HERNIA REPAIR     INGUINAL HERNIA REPAIR Left 01/02/2019   Procedure: OPEN LEFT INGUINAL HERNIA REPAIR WITH MESH;  Surgeon: Johnathan Hausen, MD;  Location: [facility];  Service: General;  Laterality: Left;   KNEE ARTHROSCOPY Right 1992; 04/2008   LAPAROSCOPIC CHOLECYSTECTOMY  2007   LEFT HEART  CATH AND CORS/GRAFTS ANGIOGRAPHY N/A 04/13/2019   Procedure: LEFT HEART CATH AND CORS/GRAFTS ANGIOGRAPHY;  Surgeon: Burnell Blanks, MD;  Location: [place] [facility];  Service: Cardiovascular;  Laterality: N/A;   NASAL SEPTUM SURGERY Right 2007   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   ROBOT ASSISTED LAPAROSCOPIC RADICAL PROSTATECTOMY  04/2010   THYROIDECTOMY, PARTIAL  1980  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   Procedure: LEFT TOTAL KNEE ARTHROPLASTY;  Surgeon: Melrose Nakayama, MD;  Location: [facility];  Service: Orthopedics;  Laterality: Left;   Allergies  Allergen Reactions   Benadryl [Diphenhydramine] Other (See Comments)    Pt told not to take because of bypass surgery   Bromfed Nausea And Vomiting   Clarithromycin Other (See Comments)     gastritis   Codeine Other (See Comments)    Trouble breathing   Cymbalta [Duloxetine Hcl]     Abdominal pain and later chest pain as well as worsening fatigue    Doxycycline Hyclate Nausea And Vomiting   Lexapro [Escitalopram]     Dizziness   Modafinil Other (See Comments)     anxiety-nervousness   Oxycodone-Acetaminophen Itching   Promethazine Hcl Other (See Comments)     fainting   Quinolones Nausea Only    Cipro "felt real bad"   Repatha [Evolocumab]     Weak, worn out   Telithromycin Nausea And Vomiting   Tramadol Nausea Only   Hydrocodone-Acetaminophen Rash   Statins Other (See Comments)    Leg cramps and makes patient feel bad. Per pt tried multiple in years past   Sulfonamide Derivatives Rash      Objective:    Physical Exam Vitals and nursing note reviewed.  Constitutional:      General: He is not in acute distress.    Appearance: Normal appearance. He is obese.  HENT:     Head: Normocephalic.  Cardiovascular:     Rate and Rhythm: Normal rate and regular rhythm.  Pulmonary:     Effort: Pulmonary effort is normal.     Breath sounds: Normal breath sounds.  Genitourinary:   Musculoskeletal:        General:  Normal range of motion.     Cervical back: Normal range of motion.  Skin:    General: Skin is warm and dry.     Findings: Lesion (left groin, approx 1.5cm diameter oval brownish lesion with open, white, moist center, tender to palpation) present.  Neurological:     Mental Status: He is alert and oriented to person, place, and time.  Psychiatric:        Mood and Affect: Mood normal.    BP 126/77 (BP Location: Left Arm, Patient Position: Sitting, Cuff Size: Large)   Pulse 75   Temp (!) 97.1 F (36.2 C) (Temporal)   Ht '5\' 6"'$  (1.676 m)   Wt 211 lb 6.4 oz (95.9 kg)   SpO2 99%   BMI 34.12 kg/m  Wt Readings from Last 3 Encounters:  04/08/22 211 lb 6.4 oz (95.9 kg)  04/02/22 211 lb 6.4 oz (95.9 kg)  03/25/22 210 lb 8 oz (95.5 kg)       Jeanie Sewer, NP

## 2022-04-13 DIAGNOSIS — G4733 Obstructive sleep apnea (adult) (pediatric): Secondary | ICD-10-CM | POA: Diagnosis not present

## 2022-04-15 DIAGNOSIS — J3081 Allergic rhinitis due to animal (cat) (dog) hair and dander: Secondary | ICD-10-CM | POA: Diagnosis not present

## 2022-04-15 DIAGNOSIS — M792 Neuralgia and neuritis, unspecified: Secondary | ICD-10-CM | POA: Diagnosis not present

## 2022-04-15 DIAGNOSIS — J301 Allergic rhinitis due to pollen: Secondary | ICD-10-CM | POA: Diagnosis not present

## 2022-04-15 DIAGNOSIS — M19071 Primary osteoarthritis, right ankle and foot: Secondary | ICD-10-CM | POA: Diagnosis not present

## 2022-04-15 DIAGNOSIS — J3089 Other allergic rhinitis: Secondary | ICD-10-CM | POA: Diagnosis not present

## 2022-04-20 DIAGNOSIS — L738 Other specified follicular disorders: Secondary | ICD-10-CM | POA: Diagnosis not present

## 2022-04-21 ENCOUNTER — Ambulatory Visit: Payer: PPO | Admitting: Family Medicine

## 2022-04-21 VITALS — BP 110/62 | HR 96 | Ht 66.0 in | Wt 211.0 lb

## 2022-04-21 DIAGNOSIS — S76302A Unspecified injury of muscle, fascia and tendon of the posterior muscle group at thigh level, left thigh, initial encounter: Secondary | ICD-10-CM | POA: Insufficient documentation

## 2022-04-21 NOTE — Assessment & Plan Note (Signed)
Left hamstring injury noted.  No defect noted.  Compression sleeve, heel lift, home exercises given to start in the next 2 weeks.  Hopefully that this will be beneficial.  Do have formal physical therapy if necessary but hopefully not necessary.  Follow-up again in 6 to 8 weeks

## 2022-04-21 NOTE — Patient Instructions (Addendum)
Thigh compression 1/8 in heel lift Start exercises after [facility] Keep appt in February

## 2022-04-21 NOTE — Progress Notes (Signed)
[facility] [place] [facility] [place] [facility] Phone: [phone] Subjective:   [name], am serving as a scribe for Dr. Hulan Saas.  I'm seeing this patient by the request  of:  [name], MD  CC: Left leg pain  [id number]  03/12/2022 Toradol injection given today again for this exacerbation of pain.  Patient also we will see if we can get him Horizant instead of the gabapentin to see if we can do this to for more extended release.  Discussed with patient icing regimen and home exercises otherwise.  Follow-up with me again in 6 to 8 weeks      Update 04/21/2022 Joshua [name] is a 82 y.o. male coming in with complaint of lumbar spine pain. Sharp pain occurring in L hamstring. Was trying to do some exercises for his knee. Pain is constant. Worse when sitting in char due to pressure but he also feels pain with walking. Patient has been trying massage, heat and topical analgesics.       Past Medical History:  Diagnosis Date   Acute medial meniscal tear    Anemia 2015   Arthritis    "middle finger right hand; right knee; neck" (02/06/2014)   Asthma    seasonal   Bladder cancer ([place]) 04/2010   "cauterized during prostate OR"   CAD (coronary artery disease)    CABG 2001   Diverticulosis    DJD (degenerative joint disease)    BACK   GERD (gastroesophageal reflux disease)    H/O inguinal hernia repair 12/2018   History of gout    Hyperlipidemia    Hypertension    IBS (irritable bowel syndrome)    Obesity    OSA (obstructive sleep apnea)    USES CPAP    Pancreatitis ~ 1980   Peripheral vascular disease ([place])    Pre-diabetes    Prostate cancer ([place]) 04/2010   Past Surgical History:  Procedure Laterality Date   ANTERIOR CERVICAL DECOMP/DISCECTOMY FUSION  05/26/2006   PLATE PLACED   BACK SURGERY     CARDIAC CATHETERIZATION  11/20/1999   CORONARY ARTERY BYPASS GRAFT  11/20/1999   SVG-RI1-RI2, SVG-OM, SVG-dRCA    CORONARY STENT INTERVENTION N/A 04/13/2019   Procedure: CORONARY STENT INTERVENTION;  Surgeon: Burnell Blanks, MD;  Location: [place] [facility];  Service: Cardiovascular;  Laterality: N/A;   HERNIA REPAIR     INGUINAL HERNIA REPAIR Left 01/02/2019   Procedure: OPEN LEFT INGUINAL HERNIA REPAIR WITH MESH;  Surgeon: Johnathan Hausen, MD;  [facility];  Service: General;  Laterality: Left;   KNEE ARTHROSCOPY Right 1992; 04/2008   LAPAROSCOPIC CHOLECYSTECTOMY  2007   LEFT HEART CATH AND CORS/GRAFTS ANGIOGRAPHY N/A 04/13/2019   Procedure: LEFT HEART CATH AND CORS/GRAFTS ANGIOGRAPHY;  Surgeon: Burnell Blanks, MD;  Location: [place] [facility];  Service: Cardiovascular;  Laterality: N/A;   NASAL SEPTUM SURGERY Right 2007   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   ROBOT ASSISTED LAPAROSCOPIC RADICAL PROSTATECTOMY  04/2010   THYROIDECTOMY, PARTIAL  1980   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   Procedure: LEFT TOTAL KNEE ARTHROPLASTY;  Surgeon: Melrose Nakayama, MD;  [facility];  Service: Orthopedics;  Laterality: Left;   Social History   Socioeconomic History   Marital status: Married    Spouse name: Not on file   Number of children: Not on file   Years of education: Not on file   Highest education level: Not on file  Occupational History   Occupation: retired    Fish farm manager: RETIRED  Tobacco Use   Smoking status: Former    Packs/day: 1.00    Years: 35.00    Total pack years: 35.00    Types: Cigarettes    Quit date: 06/16/1992    Years since quitting: 29.8   Smokeless tobacco: Never  Vaping Use   Vaping Use: Never used  Substance and Sexual Activity   Alcohol use: Yes    Alcohol/week: 2.0 standard drinks of alcohol    Types: 2 Shots of liquor per week    Comment: occ   Drug use: No   Sexual activity: Not Currently    Partners: Female  Other Topics Concern   Not on file  Social History Narrative   Married 42 years in 2015. No kids (mumps at age 34)       Retired from [employer] in Engineer, technical sales.  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      Hobbies: poker, movies and tv, staying active      Right handed   2 level home with steps he uses   Social Determinants of Radio broadcast assistant Strain: Low Risk  (05/22/2021)   Overall Financial Resource Strain (CARDIA)    Difficulty of Paying Living Expenses: Not hard at all  Food Insecurity: No Food Insecurity (05/22/2021)   Hunger Vital Sign    Worried About Running Out of Food in the Last Year: Never true    [place] in the Last Year: Never true  Transportation Needs: No Transportation Needs (05/22/2021)   PRAPARE - Hydrologist (Medical): No    Lack of Transportation (Non-Medical): No  Physical Activity: Inactive (05/22/2021)   Exercise Vital Sign    Days of Exercise per Week: 0 days    Minutes of Exercise per Session: 0 min  Stress: No Stress Concern Present (05/22/2021)   [place]    Feeling of Stress : Only a little  Social Connections: Moderately Integrated (05/22/2021)   Social Connection and Isolation Panel [NHANES]    Frequency of Communication with Friends and Family: More than three times a week    Frequency of Social Gatherings with Friends and Family: More than three times a week    Attends Religious Services: Never    Marine scientist or Organizations: Yes    Attends Music therapist: 1 to 4 times per year    Marital Status: Married   Allergies  Allergen Reactions   Benadryl [Diphenhydramine] Other (See Comments)    Pt told not to take because of bypass surgery   Bromfed Nausea And Vomiting   Clarithromycin Other (See Comments)     gastritis   Codeine Other (See Comments)    Trouble breathing   Cymbalta [Duloxetine Hcl]     Abdominal pain and later chest pain as well as worsening fatigue    Doxycycline Hyclate Nausea And Vomiting   Lexapro [Escitalopram]      Dizziness   Modafinil Other (See Comments)     anxiety-nervousness   Oxycodone-Acetaminophen Itching   Promethazine Hcl Other (See Comments)     fainting   Quinolones Nausea Only    Cipro "felt real bad"   Repatha [Evolocumab]     Weak, worn out   Telithromycin Nausea And Vomiting   Tramadol Nausea Only   Hydrocodone-Acetaminophen Rash   Statins Other (See Comments)  Leg cramps and makes patient feel bad. Per pt tried multiple in years past   Sulfonamide Derivatives Rash   Family History  Problem Relation Age of Onset   Heart disease Mother    Hyperlipidemia Mother    Heart disease Father    Hyperlipidemia Father    Diabetes Brother    Colon cancer Neg Hx    Pancreatic cancer Neg Hx    Esophageal cancer Neg Hx    Stomach cancer Neg Hx    Liver cancer Neg Hx      Current Outpatient Medications (Cardiovascular):    Alirocumab (PRALUENT) 150 MG/ML SOAJ, INJECT 1 DOSE INTO THE SKIN EVERY 14 (FOURTEEN) DAYS.   amLODipine (NORVASC) 5 MG tablet, TAKE 1 TABLET (5 MG TOTAL) BY MOUTH DAILY.   EPINEPHrine 0.3 mg/0.3 mL IJ SOAJ injection, Inject 0.3 mg into the muscle as needed for anaphylaxis. for allergic reaction   ezetimibe (ZETIA) 10 MG tablet, TAKE 1 TABLET BY MOUTH EVERY DAY   hydrochlorothiazide (HYDRODIURIL) 25 MG tablet, TAKE 1 TABLET BY MOUTH EVERY DAY   nitroGLYCERIN (NITROSTAT) 0.4 MG SL tablet, PLACE 1 TABLET UNDER THE TONGUE EVERY 5 (FIVE) MINUTES AS NEEDED.   valsartan (DIOVAN) 320 MG tablet, TAKE 1 TABLET BY MOUTH EVERY DAY  Current Outpatient Medications (Respiratory):    albuterol (PROAIR HFA) 108 (90 Base) MCG/ACT inhaler, Inhale 1-2 puffs into the lungs every 6 (six) hours as needed for wheezing or shortness of breath.   FLOVENT HFA 110 MCG/ACT inhaler, Inhale 1 puff into the lungs 2 (two) times daily.    fluticasone (FLONASE) 50 MCG/ACT nasal spray, Place 1 spray into both nostrils 2 (two) times daily.    ipratropium (ATROVENT HFA) 17 MCG/ACT inhaler,  Inhale 2 puffs into the lungs 3 (three) times daily.  Current Outpatient Medications (Analgesics):    acetaminophen (TYLENOL) 500 MG tablet, Take 1,000 mg by mouth every 6 (six) hours as needed for moderate pain or headache.   aspirin EC 81 MG tablet, Take 1 tablet (81 mg total) by mouth 2 (two) times daily after a meal.  Current Outpatient Medications (Hematological):    Cyanocobalamin (B-12) 2500 MCG TABS, Take 1,250 mcg by mouth daily.   folic acid (FOLVITE) 782 MCG tablet, Take 800 mcg by mouth daily.   Current Outpatient Medications (Other):    cholecalciferol (VITAMIN D3) 25 MCG (1000 UNIT) tablet, Take 1,000 Units by mouth daily.   clobetasol cream (TEMOVATE) 0.05 %, APPLY TO AFFECTED AREA TWICE A DAY up to 7 days maximum   diazepam (VALIUM) 5 MG tablet, Take 1 tablet (5 mg total) by mouth at bedtime as needed (sleep).   gabapentin (NEURONTIN) 300 MG capsule, Take 1 cap in AM, 1 cap at noon, 3 caps at bedtime (Patient taking differently: Take 2 cap in AM, 1 cap at noon, 2caps at bedtime)   Gabapentin Enacarbil (HORIZANT) 600 MG TBCR, Take 1 tablet (600 mg total) by mouth at bedtime.   GEMTESA 75 MG TABS, TAKE 1 TABLET BY MOUTH EVERY DAY   Glucos-Chond-Hyal Ac-Ca Fructo (MOVE FREE JOINT HEALTH ADVANCE PO), Take 1 tablet by mouth daily.   ketoconazole (NIZORAL) 2 % cream, Apply 1 application topically See admin instructions. Twice a day every other day   Miconazole Nitrate (LOTRIMIN AF DEODORANT POWDER) 2 % AERP, Apply 1 spray topically daily as needed (jock itch).   pantoprazole (PROTONIX) 40 MG tablet, TAKE 1 TABLET BY MOUTH EVERY DAY   polyethylene glycol powder (GLYCOLAX/MIRALAX) powder, Take 17 g  by mouth daily.   silver sulfADIAZINE (SILVADENE) 1 % cream, Apply 1 Application topically daily.   Wheat Dextrin (BENEFIBER) POWD, Take 1 Dose by mouth daily. 1 dose = 2 teaspoons   Reviewed prior external information including notes and imaging from  primary care provider As well as  notes that were available from care everywhere and other healthcare systems.  Past medical history, social, surgical and family history all reviewed in electronic medical record.  No pertanent information unless stated regarding to the chief complaint.   Review of Systems:  No headache, visual changes, nausea, vomiting, diarrhea, constipation, dizziness, abdominal pain, skin rash, fevers, chills, night sweats, weight loss, swollen lymph nodes,, joint swelling, chest pain, shortness of breath, mood changes. POSITIVE muscle aches, body aches  Objective  Blood pressure 110/62, pulse 96, height '5\' 6"'$  (1.676 m), weight 211 lb (95.7 kg), SpO2 98 %.   General: No apparent distress alert and oriented x3 mood and affect normal, dressed appropriately.  HEENT: Pupils equal, extraocular movements intact  Respiratory: Patient's speak in full sentences and does not appear short of breath  Cardiovascular: No lower extremity edema, non tender, no erythema  Patient's left hamstring does have pain with resisted flexion of the knee.  Does have tenderness noted in the mid belly of the hamstring.  Negative straight leg test but does have tightness.    Impression and Recommendations:     The above documentation has been reviewed and is accurate and complete Lyndal Pulley, DO

## 2022-04-24 ENCOUNTER — Other Ambulatory Visit: Payer: Self-pay | Admitting: Family Medicine

## 2022-04-24 ENCOUNTER — Other Ambulatory Visit: Payer: Self-pay | Admitting: Internal Medicine

## 2022-04-24 DIAGNOSIS — L282 Other prurigo: Secondary | ICD-10-CM

## 2022-04-27 ENCOUNTER — Ambulatory Visit: Payer: PPO | Admitting: Neurology

## 2022-04-27 ENCOUNTER — Encounter: Payer: Self-pay | Admitting: Neurology

## 2022-04-27 VITALS — BP 116/56 | HR 69 | Ht 66.0 in | Wt 212.0 lb

## 2022-04-27 DIAGNOSIS — G609 Hereditary and idiopathic neuropathy, unspecified: Secondary | ICD-10-CM

## 2022-04-27 MED ORDER — GABAPENTIN 300 MG PO CAPS: ORAL_CAPSULE | ORAL | 3 refills | Status: DC
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
## 2022-04-27 NOTE — Patient Instructions (Signed)
Always a pleasure to see you. Continue Gabapentin '300mg'$ : take 2 caps in AM, 1 cap at home, 2 caps at bedtime. Follow-up in 1 year, call for any changes.

## 2022-04-29 DIAGNOSIS — J3081 Allergic rhinitis due to animal (cat) (dog) hair and dander: Secondary | ICD-10-CM | POA: Diagnosis not present

## 2022-04-29 DIAGNOSIS — J301 Allergic rhinitis due to pollen: Secondary | ICD-10-CM | POA: Diagnosis not present

## 2022-04-29 DIAGNOSIS — J3089 Other allergic rhinitis: Secondary | ICD-10-CM | POA: Diagnosis not present

## 2022-05-05 ENCOUNTER — Other Ambulatory Visit: Payer: Self-pay | Admitting: Family Medicine

## 2022-05-05 ENCOUNTER — Other Ambulatory Visit: Payer: Self-pay | Admitting: Internal Medicine

## 2022-05-07 NOTE — Progress Notes (Signed)
[facility] [place] Phone: [phone] Subjective:   [name], am serving as a scribe for Dr. Hulan Saas.  I'm seeing this patient by the request  of:  [name], MD  CC: Back pain, hamstring pain follow-up  [id number]  04/21/2022 Left hamstring injury noted.  No defect noted.  Compression sleeve, heel lift, home exercises given to start in the next 2 weeks.  Hopefully that this will be beneficial.  Do have formal physical therapy if necessary but hopefully not necessary.  Follow-up again in 6 to 8 weeks    Update 05/12/2022 Joshua [name] is a 82 y.o. male coming in with complaint of L hamstring injury. Patient states that his hamstring is doing much better. Neck pain has also improved.   Lumbar spine continues to bother him. R hip in posterior aspect was bothering him yesterday and states that his pain comes on and goes based on the day. Walking increases his pain. Sitting reduces pain.     Past Medical History:  Diagnosis Date   Acute medial meniscal tear    Anemia 2015   Arthritis    "middle finger right hand; right knee; neck" (02/06/2014)   Asthma    seasonal   Bladder cancer ([place]) 04/2010   "cauterized during prostate OR"   CAD (coronary artery disease)    CABG 2001   Diverticulosis    DJD (degenerative joint disease)    BACK   GERD (gastroesophageal reflux disease)    H/O inguinal hernia repair 12/2018   History of gout    Hyperlipidemia    Hypertension    IBS (irritable bowel syndrome)    Obesity    OSA (obstructive sleep apnea)    USES CPAP    Pancreatitis ~ 1980   Peripheral vascular disease ([place])    Pre-diabetes    Prostate cancer ([place]) 04/2010   Past Surgical History:  Procedure Laterality Date   ANTERIOR CERVICAL DECOMP/DISCECTOMY FUSION  05/26/2006   PLATE PLACED   BACK SURGERY     CARDIAC CATHETERIZATION  11/20/1999   CORONARY ARTERY BYPASS GRAFT  11/20/1999    SVG-RI1-RI2, SVG-OM, SVG-dRCA   CORONARY STENT INTERVENTION N/A 04/13/2019   Procedure: CORONARY STENT INTERVENTION;  Surgeon: Burnell Blanks, MD;  Location: [facility];  Service: Cardiovascular;  Laterality: N/A;   HERNIA REPAIR     INGUINAL HERNIA REPAIR Left 01/02/2019   Procedure: OPEN LEFT INGUINAL HERNIA REPAIR WITH MESH;  Surgeon: Johnathan Hausen, MD;  Location: [facility];  Service: General;  Laterality: Left;   KNEE ARTHROSCOPY Right 1992; 04/2008   LAPAROSCOPIC CHOLECYSTECTOMY  2007   LEFT HEART CATH AND CORS/GRAFTS ANGIOGRAPHY N/A 04/13/2019   Procedure: LEFT HEART CATH AND CORS/GRAFTS ANGIOGRAPHY;  Surgeon: Burnell Blanks, MD;  Location: [facility];  Service: Cardiovascular;  Laterality: N/A;   NASAL SEPTUM SURGERY Right 2007   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   ROBOT ASSISTED LAPAROSCOPIC RADICAL PROSTATECTOMY  04/2010   THYROIDECTOMY, PARTIAL  1980   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   Procedure: LEFT TOTAL KNEE ARTHROPLASTY;  Surgeon: Melrose Nakayama, MD;  Location: [facility];  Service: Orthopedics;  Laterality: Left;   Social History   Socioeconomic History   Marital status: Married    Spouse name: Not on file   Number of children: Not on file   Years of education: Not on file   Highest education level: Not on file  Occupational History   Occupation: retired    Fish farm manager: RETIRED  Tobacco Use   Smoking status: Former    Packs/day: 1.00    Years: 35.00    Total pack years: 35.00    Types: Cigarettes    Quit date: 06/16/1992    Years since quitting: 29.9   Smokeless tobacco: Never  Vaping Use   Vaping Use: Never used  Substance and Sexual Activity   Alcohol use: Yes    Alcohol/week: 2.0 standard drinks of alcohol    Types: 2 Shots of liquor per week    Comment: occ   Drug use: No   Sexual activity: Not Currently    Partners: Female  Other Topics Concern   Not on file  Social History Narrative   Married 42 years in 2015. 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      Hobbies: poker, movies and tv, staying active      Right handed   2 level home with steps he uses   Social Determinants of Radio broadcast assistant Strain: Low Risk  (05/22/2021)   Overall Financial Resource Strain (CARDIA)    Difficulty of Paying Living Expenses: Not hard at all  Food Insecurity: No Food Insecurity (05/22/2021)   Hunger Vital Sign    Worried About Running Out of Food in the Last Year: Never true    [place] in the Last Year: Never true  Transportation Needs: No Transportation Needs (05/22/2021)   PRAPARE - Hydrologist (Medical): No    Lack of Transportation (Non-Medical): No  Physical Activity: Inactive (05/22/2021)   Exercise Vital Sign    Days of Exercise per Week: 0 days    Minutes of Exercise per Session: 0 min  Stress: No Stress Concern Present (05/22/2021)   [place]    Feeling of Stress : Only a little  Social Connections: Moderately Integrated (05/22/2021)   Social Connection and Isolation Panel [NHANES]    Frequency of Communication with Friends and Family: More than three times a week    Frequency of Social Gatherings with Friends and Family: More than three times a week    Attends Religious Services: Never    Marine scientist or Organizations: Yes    Attends Music therapist: 1 to 4 times per year    Marital Status: Married   Allergies  Allergen Reactions   Benadryl [Diphenhydramine] Other (See Comments)    Pt told not to take because of bypass surgery   Bromfed Nausea And Vomiting   Clarithromycin Other (See Comments)     gastritis   Codeine Other (See Comments)    Trouble breathing   Cymbalta [Duloxetine Hcl]     Abdominal pain and later chest pain as well as worsening fatigue    Doxycycline Hyclate Nausea And  Vomiting   Lexapro [Escitalopram]     Dizziness   Modafinil Other (See Comments)     anxiety-nervousness   Oxycodone-Acetaminophen Itching   Promethazine Hcl Other (See Comments)     fainting   Quinolones Nausea Only    Cipro "felt real bad"   Repatha [Evolocumab]     Weak, worn out   Telithromycin Nausea And Vomiting   Tramadol Nausea Only   Hydrocodone-Acetaminophen Rash   Statins Other (See Comments)  Leg cramps and makes patient feel bad. Per pt tried multiple in years past   Sulfonamide Derivatives Rash   Family History  Problem Relation Age of Onset   Heart disease Mother    Hyperlipidemia Mother    Heart disease Father    Hyperlipidemia Father    Diabetes Brother    Colon cancer Neg Hx    Pancreatic cancer Neg Hx    Esophageal cancer Neg Hx    Stomach cancer Neg Hx    Liver cancer Neg Hx      Current Outpatient Medications (Cardiovascular):    Alirocumab (PRALUENT) 150 MG/ML SOAJ, INJECT 1 DOSE INTO THE SKIN EVERY 14 (FOURTEEN) DAYS.   amLODipine (NORVASC) 5 MG tablet, TAKE 1 TABLET (5 MG TOTAL) BY MOUTH DAILY.   EPINEPHrine 0.3 mg/0.3 mL IJ SOAJ injection, Inject 0.3 mg into the muscle as needed for anaphylaxis. for allergic reaction   ezetimibe (ZETIA) 10 MG tablet, TAKE 1 TABLET BY MOUTH EVERY DAY   hydrochlorothiazide (HYDRODIURIL) 25 MG tablet, TAKE 1 TABLET BY MOUTH EVERY DAY   nitroGLYCERIN (NITROSTAT) 0.4 MG SL tablet, PLACE 1 TABLET UNDER THE TONGUE EVERY 5 (FIVE) MINUTES AS NEEDED.   valsartan (DIOVAN) 320 MG tablet, TAKE 1 TABLET BY MOUTH EVERY DAY  Current Outpatient Medications (Respiratory):    albuterol (PROAIR HFA) 108 (90 Base) MCG/ACT inhaler, Inhale 1-2 puffs into the lungs every 6 (six) hours as needed for wheezing or shortness of breath.   FLOVENT HFA 110 MCG/ACT inhaler, Inhale 1 puff into the lungs 2 (two) times daily.    fluticasone (FLONASE) 50 MCG/ACT nasal spray, Place 1 spray into both nostrils daily.   ipratropium (ATROVENT HFA) 17  MCG/ACT inhaler, Inhale 2 puffs into the lungs 3 (three) times daily.  Current Outpatient Medications (Analgesics):    acetaminophen (TYLENOL) 500 MG tablet, Take 1,000 mg by mouth every 6 (six) hours as needed for moderate pain or headache.   aspirin EC 81 MG tablet, Take 1 tablet (81 mg total) by mouth 2 (two) times daily after a meal.  Current Outpatient Medications (Hematological):    Cyanocobalamin (B-12) 2500 MCG TABS, Take 1,250 mcg by mouth daily.   folic acid (FOLVITE) [id number] MCG tablet, Take 800 mcg by mouth daily.   Current Outpatient Medications (Other):    cholecalciferol (VITAMIN D3) 25 MCG (1000 UNIT) tablet, Take 1,000 Units by mouth daily.   clobetasol cream (TEMOVATE) 0.05 %, APPLY TO AFFECTED AREA TWICE A DAY See Dr. Yong Channel soon and discuss about further refills.   diazepam (VALIUM) 5 MG tablet, Take 1 tablet (5 mg total) by mouth at bedtime as needed (sleep).   gabapentin (NEURONTIN) 300 MG capsule, Take 2 cap in AM, 1 cap at noon, 2caps at bedtime   GEMTESA 75 MG TABS, TAKE 1 TABLET BY MOUTH EVERY DAY   Glucos-Chond-Hyal Ac-Ca Fructo (MOVE FREE JOINT HEALTH ADVANCE PO), Take 1 tablet by mouth daily.   ketoconazole (NIZORAL) 2 % cream, Apply 1 application topically See admin instructions. Twice a day every other day   Miconazole Nitrate (LOTRIMIN AF DEODORANT POWDER) 2 % AERP, Apply 1 spray topically daily as needed (jock itch).   pantoprazole (PROTONIX) 40 MG tablet, TAKE 1 TABLET BY MOUTH EVERY DAY   polyethylene glycol powder (GLYCOLAX/MIRALAX) powder, Take 17 g by mouth daily.   silver sulfADIAZINE (SILVADENE) 1 % cream, Apply 1 Application topically daily.   Wheat Dextrin (BENEFIBER) POWD, Take 1 Dose by mouth daily. 1 dose = 2 teaspoons  Reviewed prior external information including notes and imaging from  primary care provider As well as notes that were available from care everywhere and other healthcare systems.  Past medical history, social, surgical and family  history all reviewed in electronic medical record.  No pertanent information unless stated regarding to the chief complaint.   Review of Systems:  No headache, visual changes, nausea, vomiting, diarrhea, constipation, dizziness, abdominal pain, skin rash, fevers, chills, night sweats, weight loss, swollen lymph nodes, joint swelling, chest pain, shortness of breath, mood changes. POSITIVE muscle aches, body aches  Objective  Blood pressure 108/62, pulse 81, height 5' 6"$  (1.676 m), weight 212 lb (96.2 kg), SpO2 98 %.   General: No apparent distress alert and oriented x3 mood and affect normal, dressed appropriately.  HEENT: Pupils equal, extraocular movements intact  Respiratory: Patient's speak in full sentences and does not appear short of breath  Cardiovascular: No lower extremity edema, non tender, no erythema   Patient does have arthritic changes in multiple different areas.  Patient is tender to palpation in the paraspinal musculature.  Patient does have some tightness of the hamstrings left greater than right.  Patient does have some peripheral neuropathy noted but does feel pressure on his feet just fine.    Impression and Recommendations:    The above documentation has been reviewed and is accurate and complete Lyndal Pulley, DO

## 2022-05-12 ENCOUNTER — Ambulatory Visit: Payer: PPO | Admitting: Family Medicine

## 2022-05-12 VITALS — BP 108/62 | HR 81 | Ht 66.0 in | Wt 212.0 lb

## 2022-05-12 DIAGNOSIS — S76302A Unspecified injury of muscle, fascia and tendon of the posterior muscle group at thigh level, left thigh, initial encounter: Secondary | ICD-10-CM | POA: Diagnosis not present

## 2022-05-12 NOTE — Patient Instructions (Signed)
Keep doing exercises See me in 2 months

## 2022-05-12 NOTE — Assessment & Plan Note (Signed)
Patient has made significant strides but still has some tightness noted.  Discussed icing regimen and home exercises, discussed which activities to do and which ones to avoid.  Patient has multiple different allergies that do not make it somewhat difficult.  Social determinant health as patient does have difficulty with being active or walking more than 200 feet secondary to his chronic comorbidities.  Follow-up with me again in 2 to 3 months

## 2022-05-13 DIAGNOSIS — G4733 Obstructive sleep apnea (adult) (pediatric): Secondary | ICD-10-CM | POA: Diagnosis not present

## 2022-05-13 DIAGNOSIS — J3081 Allergic rhinitis due to animal (cat) (dog) hair and dander: Secondary | ICD-10-CM | POA: Diagnosis not present

## 2022-05-13 DIAGNOSIS — J301 Allergic rhinitis due to pollen: Secondary | ICD-10-CM | POA: Diagnosis not present

## 2022-05-13 DIAGNOSIS — J3089 Other allergic rhinitis: Secondary | ICD-10-CM | POA: Diagnosis not present

## 2022-05-22 ENCOUNTER — Telehealth: Payer: Self-pay | Admitting: Pharmacist

## 2022-05-22 NOTE — Progress Notes (Addendum)
Care Management & Coordination Services Pharmacy Team  Reason for Encounter: Hypertension  Contacted patient to discuss hypertension disease state. Spoke with patient on 05/22/2022     Current antihypertensive regimen:  Amlodipine 5 mg daily Valsartan 320 mg daily  Patient verbally confirms he is taking the above medications as directed. No  How often are you checking your Blood Pressure? several times per month  he checks his blood pressure in the middle of the day after taking his medication.  Current home BP readings: 106/62  Wrist or arm cuff: arm  Any readings above 180/100? No If yes any symptoms of hypertensive emergency? patient denies any symptoms of high blood pressure  What recent interventions/DTPs have been made by any provider to improve Blood Pressure control since last CPP Visit: No  Any recent hospitalizations or ED visits since last visit with CPP? No  What diet changes have been made to improve Blood Pressure Control?  Patient states he has a poor diet.  What exercise is being done to improve your Blood Pressure Control?  Very little to none per patient.  Adherence Review: Is the patient currently on ACE/ARB medication? Yes Does the patient have >5 day gap between last estimated fill dates? No  Patient states his diarrhea has improved.  Flovent went up to $100/month, Logan Bores is still expensive as well. Patient states he does not qualify for patient assistance.  Star Rating Drugs:  Valsartan 320 mg last filled 04/06/2022 90 DS   Chart Updates: Recent office visits:  04/08/2022 OV (Fam Med) Jeanie Sewer, NP; no medication changes indicated.  04/02/2022 OV (PCP) [name], MD; no medication changes indicated.  03/25/2022 OV (Fam Med) Inda Coke, PA; no medication changes indicated.  Recent consult visits:  05/12/2022 OV (Sports Medicine) Lyndal Pulley, DO; no medication changes indicated.  04/27/2022 OV (Neurology) Cameron Sprang, MD; no medication changes indicated.  04/21/2022 OV (Sports Medicine) Lyndal Pulley, DO; no medication changes indicated.  03/12/2022 OV (Sports Medicine) Lyndal Pulley, DO; no medication changes indicated.  Hospital visits:  None in previous 6 months  Medications: Outpatient Encounter Medications as of 05/22/2022  Medication Sig Note   acetaminophen (TYLENOL) 500 MG tablet Take 1,000 mg by mouth every 6 (six) hours as needed for moderate pain or headache.    albuterol (PROAIR HFA) 108 (90 Base) MCG/ACT inhaler Inhale 1-2 puffs into the lungs every 6 (six) hours as needed for wheezing or shortness of breath.    Alirocumab (PRALUENT) 150 MG/ML SOAJ INJECT 1 DOSE INTO THE SKIN EVERY 14 (FOURTEEN) DAYS.    amLODipine (NORVASC) 5 MG tablet TAKE 1 TABLET (5 MG TOTAL) BY MOUTH DAILY.    aspirin EC 81 MG tablet Take 1 tablet (81 mg total) by mouth 2 (two) times daily after a meal. 11/01/2020: Patient is taking only 1 tab/day   cholecalciferol (VITAMIN D3) 25 MCG (1000 UNIT) tablet Take 1,000 Units by mouth daily.    clobetasol cream (TEMOVATE) 0.05 % APPLY TO AFFECTED AREA TWICE A DAY See Dr. Yong Channel soon and discuss about further refills.    Cyanocobalamin (B-12) 2500 MCG TABS Take 1,250 mcg by mouth daily.    diazepam (VALIUM) 5 MG tablet Take 1 tablet (5 mg total) by mouth at bedtime as needed (sleep).    EPINEPHrine 0.3 mg/0.3 mL IJ SOAJ injection Inject 0.3 mg into the muscle as needed for anaphylaxis. for allergic reaction 01/29/2021: On hand   ezetimibe (ZETIA) 10 MG tablet TAKE 1  TABLET BY MOUTH EVERY DAY    FLOVENT HFA 110 MCG/ACT inhaler Inhale 1 puff into the lungs 2 (two) times daily.     fluticasone (FLONASE) 50 MCG/ACT nasal spray Place 1 spray into both nostrils daily.    folic acid (FOLVITE) Q000111Q MCG tablet Take 800 mcg by mouth daily.  11/01/2020: Pt is taking 800 mcg unable to find 400 mcg.   gabapentin (NEURONTIN) 300 MG capsule Take 2 cap in AM, 1 cap at noon, 2caps at bedtime     GEMTESA 75 MG TABS TAKE 1 TABLET BY MOUTH EVERY DAY    Glucos-Chond-Hyal Ac-Ca Fructo (MOVE FREE JOINT HEALTH ADVANCE PO) Take 1 tablet by mouth daily.    hydrochlorothiazide (HYDRODIURIL) 25 MG tablet TAKE 1 TABLET BY MOUTH EVERY DAY    ipratropium (ATROVENT HFA) 17 MCG/ACT inhaler Inhale 2 puffs into the lungs 3 (three) times daily.    ketoconazole (NIZORAL) 2 % cream Apply 1 application topically See admin instructions. Twice a day every other day    Miconazole Nitrate (LOTRIMIN AF DEODORANT POWDER) 2 % AERP Apply 1 spray topically daily as needed (jock itch).    nitroGLYCERIN (NITROSTAT) 0.4 MG SL tablet PLACE 1 TABLET UNDER THE TONGUE EVERY 5 (FIVE) MINUTES AS NEEDED. 01/29/2021: On hand   pantoprazole (PROTONIX) 40 MG tablet TAKE 1 TABLET BY MOUTH EVERY DAY    polyethylene glycol powder (GLYCOLAX/MIRALAX) powder Take 17 g by mouth daily.    silver sulfADIAZINE (SILVADENE) 1 % cream Apply 1 Application topically daily.    valsartan (DIOVAN) 320 MG tablet TAKE 1 TABLET BY MOUTH EVERY DAY    Wheat Dextrin (BENEFIBER) POWD Take 1 Dose by mouth daily. 1 dose = 2 teaspoons    No facility-administered encounter medications on file as of 05/22/2022.    Recent Office Vitals: BP Readings from Last 3 Encounters:  05/12/22 108/62  04/27/22 (!) 116/56  04/21/22 110/62   Pulse Readings from Last 3 Encounters:  05/12/22 81  04/27/22 69  04/21/22 96    Wt Readings from Last 3 Encounters:  05/12/22 212 lb (96.2 kg)  04/27/22 212 lb (96.2 kg)  04/21/22 211 lb (95.7 kg)     Kidney Function Lab Results  Component Value Date/Time   CREATININE 1.33 03/25/2022 01:40 PM   CREATININE 1.26 12/23/2021 03:52 PM   CREATININE 1.33 (H) 02/23/2020 09:33 AM   CREATININE 1.46 (H) 10/01/2017 05:03 PM   GFR 50.98 (L) 03/25/2022 01:40 PM   GFRNONAA 52 (L) 08/11/2020 12:44 AM   GFRNONAA 51 (L) 02/23/2020 09:33 AM   GFRAA 59 (L) 02/23/2020 09:33 AM       Latest Ref Rng & Units 03/25/2022    1:40 PM  12/23/2021    3:52 PM 05/13/2021   11:01 AM  BMP  Glucose 70 - 99 mg/dL 122  104  105   BUN 6 - 23 mg/dL '23  21  18   '$ Creatinine 0.40 - 1.50 mg/dL 1.33  1.26  1.27   BUN/Creat Ratio 10 - 24  17    Sodium 135 - 145 mEq/L 138  140  136   Potassium 3.5 - 5.1 mEq/L 3.7  4.3  4.1   Chloride 96 - 112 mEq/L 99  100  99   CO2 19 - 32 mEq/L 27  22  32   Calcium 8.4 - 10.5 mg/dL 10.0  9.4  10.2      Future Appointments  Date Time Provider [place]  06/04/2022  8:30 AM  [facility]  07/03/2022  9:00 AM [name], MD [facility]  07/13/2022 10:45 AM Debara Pickett, Nadean Corwin, MD [facility] None  07/14/2022  9:30 AM Lyndal Pulley, DO [facility] None  08/31/2022  2:30 PM Edythe Clarity, [place] None  04/28/2023  8:30 AM Cameron Sprang, MD [facility] None   April D Calhoun, [place] Pharmacist Assistant [phone]

## 2022-05-27 ENCOUNTER — Encounter: Payer: Self-pay | Admitting: Internal Medicine

## 2022-05-27 DIAGNOSIS — J3081 Allergic rhinitis due to animal (cat) (dog) hair and dander: Secondary | ICD-10-CM | POA: Diagnosis not present

## 2022-05-27 DIAGNOSIS — J3089 Other allergic rhinitis: Secondary | ICD-10-CM | POA: Diagnosis not present

## 2022-05-27 DIAGNOSIS — J301 Allergic rhinitis due to pollen: Secondary | ICD-10-CM | POA: Diagnosis not present

## 2022-05-29 DIAGNOSIS — M25461 Effusion, right knee: Secondary | ICD-10-CM | POA: Diagnosis not present

## 2022-05-29 DIAGNOSIS — M25561 Pain in right knee: Secondary | ICD-10-CM | POA: Diagnosis not present

## 2022-05-29 DIAGNOSIS — M67361 Transient synovitis, right knee: Secondary | ICD-10-CM | POA: Diagnosis not present

## 2022-05-29 DIAGNOSIS — M13841 Other specified arthritis, right hand: Secondary | ICD-10-CM | POA: Diagnosis not present

## 2022-06-04 ENCOUNTER — Ambulatory Visit (INDEPENDENT_AMBULATORY_CARE_PROVIDER_SITE_OTHER): Payer: PPO

## 2022-06-04 VITALS — Wt 212.0 lb

## 2022-06-04 DIAGNOSIS — Z0000 Encounter for general adult medical examination without abnormal findings: Secondary | ICD-10-CM

## 2022-06-04 NOTE — Progress Notes (Signed)
I connected with  Joshua [name] on 06/04/22 by a video and audio enabled telemedicine application and verified that I am speaking with the correct person using two identifiers. Pt was telephonic    Patient Medicare AWV questionnaire was completed by the patient on 05/31/22 I have confirmed that all information answered by patient is correct and no changes since this date.     Patient Location: Home  Provider Location: Office/Clinic  I discussed the limitations of evaluation and management by telemedicine. The patient expressed understanding and agreed to proceed.   Subjective:   Joshua [name] is a 82 y.o. male who presents for Medicare Annual/Subsequent preventive examination.  Review of Systems     Cardiac Risk Factors include: advanced age (>36mn, >>51women);hypertension;dyslipidemia;male gender;obesity (BMI >30kg/m2)     Objective:    Today's Vitals   06/04/22 0837  Weight: 212 lb (96.2 kg)   Body mass index is 34.22 kg/m.     06/04/2022    8:43 AM 04/27/2022   10:19 AM 01/19/2022    9:14 AM 05/28/2021    2:12 PM 05/22/2021   11:12 AM 04/23/2021   11:24 PM 04/08/2021    3:53 PM  Advanced Directives  Does Patient Have a Medical Advance Directive? Yes Yes Yes Yes Yes Yes Yes  Type of AParamed[place]of [place] will [place] will;Out of facility DNR (pink MOST or yellow form) Healthcare Power of [place]Living will Healthcare Power of [place] will   Does patient want to make changes to medical advance directive? No - Patient declined  No - Patient declined      Copy of [place]in Chart? Yes - validated most recent copy scanned in chart (See row information)  Yes - validated most recent copy scanned in chart (See row information)  Yes - validated most recent copy scanned in chart (See row information)      Current Medications  (verified) Outpatient Encounter Medications as of 06/04/2022  Medication Sig   acetaminophen (TYLENOL) 500 MG tablet Take 1,000 mg by mouth every 6 (six) hours as needed for moderate pain or headache.   albuterol (PROAIR HFA) 108 (90 Base) MCG/ACT inhaler Inhale 1-2 puffs into the lungs every 6 (six) hours as needed for wheezing or shortness of breath.   Alirocumab (PRALUENT) 150 MG/ML SOAJ INJECT 1 DOSE INTO THE SKIN EVERY 14 (FOURTEEN) DAYS.   amLODipine (NORVASC) 5 MG tablet TAKE 1 TABLET (5 MG TOTAL) BY MOUTH DAILY.   aspirin EC 81 MG tablet Take 1 tablet (81 mg total) by mouth 2 (two) times daily after a meal.   cholecalciferol (VITAMIN D3) 25 MCG (1000 UNIT) tablet Take 1,000 Units by mouth daily.   clobetasol cream (TEMOVATE) 0.05 % APPLY TO AFFECTED AREA TWICE A DAY See Dr. HYong Channelsoon and discuss about further refills.   Cyanocobalamin (B-12) 2500 MCG TABS Take 1,250 mcg by mouth daily.   diazepam (VALIUM) 5 MG tablet Take 1 tablet (5 mg total) by mouth at bedtime as needed (sleep).   EPINEPHrine 0.3 mg/0.3 mL IJ SOAJ injection Inject 0.3 mg into the muscle as needed for anaphylaxis. for allergic reaction   ezetimibe (ZETIA) 10 MG tablet TAKE 1 TABLET BY MOUTH EVERY DAY   FLOVENT HFA 110 MCG/ACT inhaler Inhale 1 puff into the lungs 2 (two) times daily.    fluticasone (FLONASE) 50 MCG/ACT nasal spray Place 1 spray into both nostrils  daily.   folic acid (FOLVITE) Q000111Q MCG tablet Take 800 mcg by mouth daily.    gabapentin (NEURONTIN) 300 MG capsule Take 2 cap in AM, 1 cap at noon, 2caps at bedtime   GEMTESA 75 MG TABS TAKE 1 TABLET BY MOUTH EVERY DAY   Glucos-Chond-Hyal Ac-Ca Fructo (MOVE FREE JOINT HEALTH ADVANCE PO) Take 1 tablet by mouth daily.   hydrochlorothiazide (HYDRODIURIL) 25 MG tablet TAKE 1 TABLET BY MOUTH EVERY DAY   ipratropium (ATROVENT HFA) 17 MCG/ACT inhaler Inhale 2 puffs into the lungs 3 (three) times daily.   ketoconazole (NIZORAL) 2 % cream Apply 1 application topically  See admin instructions. Twice a day every other day   Miconazole Nitrate (LOTRIMIN AF DEODORANT POWDER) 2 % AERP Apply 1 spray topically daily as needed (jock itch).   nitroGLYCERIN (NITROSTAT) 0.4 MG SL tablet PLACE 1 TABLET UNDER THE TONGUE EVERY 5 (FIVE) MINUTES AS NEEDED.   pantoprazole (PROTONIX) 40 MG tablet TAKE 1 TABLET BY MOUTH EVERY DAY   polyethylene glycol powder (GLYCOLAX/MIRALAX) powder Take 17 g by mouth daily.   silver sulfADIAZINE (SILVADENE) 1 % cream Apply 1 Application topically daily.   valsartan (DIOVAN) 320 MG tablet TAKE 1 TABLET BY MOUTH EVERY DAY   Wheat Dextrin (BENEFIBER) POWD Take 1 Dose by mouth daily. 1 dose = 2 teaspoons   No facility-administered encounter medications on file as of 06/04/2022.    Allergies (verified) Benadryl [diphenhydramine], Bromfed, Clarithromycin, Codeine, Cymbalta [duloxetine hcl], Doxycycline hyclate, Lexapro [escitalopram], Modafinil, Oxycodone-acetaminophen, Promethazine hcl, Quinolones, Repatha [evolocumab], Telithromycin, Tramadol, Hydrocodone-acetaminophen, Statins, and Sulfonamide derivatives   History: Past Medical History:  Diagnosis Date   Acute medial meniscal tear    Anemia 2015   Arthritis    "middle finger right hand; right knee; neck" (02/06/2014)   Asthma    seasonal   Bladder cancer ([place]) 04/2010   "cauterized during prostate OR"   CAD (coronary artery disease)    CABG 2001   Diverticulosis    DJD (degenerative joint disease)    BACK   GERD (gastroesophageal reflux disease)    H/O inguinal hernia repair 12/2018   History of gout    Hyperlipidemia    Hypertension    IBS (irritable bowel syndrome)    Obesity    OSA (obstructive sleep apnea)    USES CPAP    Pancreatitis ~ 1980   Peripheral vascular disease ([place])    Pre-diabetes    Prostate cancer ([place]) 04/2010   Past Surgical History:  Procedure Laterality Date   ANTERIOR CERVICAL DECOMP/DISCECTOMY FUSION  05/26/2006   PLATE PLACED   BACK SURGERY      CARDIAC CATHETERIZATION  11/20/1999   CORONARY ARTERY BYPASS GRAFT  11/20/1999   SVG-RI1-RI2, SVG-OM, SVG-dRCA   CORONARY STENT INTERVENTION N/A 04/13/2019   Procedure: CORONARY STENT INTERVENTION;  Surgeon: Burnell Blanks, MD;  Location: [facility];  Service: Cardiovascular;  Laterality: N/A;   HERNIA REPAIR     INGUINAL HERNIA REPAIR Left 01/02/2019   Procedure: OPEN LEFT INGUINAL HERNIA REPAIR WITH MESH;  Surgeon: Johnathan Hausen, MD;  Location: [facility];  Service: General;  Laterality: Left;   KNEE ARTHROSCOPY Right 1992; 04/2008   LAPAROSCOPIC CHOLECYSTECTOMY  2007   LEFT HEART CATH AND CORS/GRAFTS ANGIOGRAPHY N/A 04/13/2019   Procedure: LEFT HEART CATH AND CORS/GRAFTS ANGIOGRAPHY;  Surgeon: Burnell Blanks, MD;  Location: [place] [facility];  Service: Cardiovascular;  Laterality: N/A;   NASAL SEPTUM SURGERY Right 2007   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   ROBOT ASSISTED LAPAROSCOPIC RADICAL PROSTATECTOMY  04/2010   THYROIDECTOMY, PARTIAL  1980   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   Procedure: LEFT TOTAL KNEE ARTHROPLASTY;  Surgeon: Melrose Nakayama, MD;  Location: [facility];  Service: Orthopedics;  Laterality: Left;   Family History  Problem Relation Age of Onset   Heart disease Mother    Hyperlipidemia Mother    Heart disease Father    Hyperlipidemia Father    Diabetes Brother    Colon cancer Neg Hx    Pancreatic cancer Neg Hx    Esophageal cancer Neg Hx    Stomach cancer Neg Hx    Liver cancer Neg Hx    Social History   Socioeconomic History   Marital status: Married    Spouse name: Not on file   Number of children: Not on file   Years of education: Not on file   Highest education level: Not on file  Occupational History   Occupation: retired    Fish farm manager: RETIRED  Tobacco Use   Smoking status: Former    Packs/day: 1.00    Years: 35.00    Additional pack years: 0.00    Total pack years: 35.00    Types: Cigarettes    Quit date: 06/16/1992     Years since quitting: 29.9   Smokeless tobacco: Never  Vaping Use   Vaping Use: Never used  Substance and Sexual Activity   Alcohol use: Yes    Alcohol/week: 2.0 standard drinks of alcohol    Types: 2 Shots of liquor per week    Comment: occ   Drug use: No   Sexual activity: Not Currently    Partners: Female  Other Topics Concern   Not on file  Social History Narrative   Married 42 years in 2015. 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      Hobbies: poker, movies and tv, staying active      Right handed   2 level home with steps he uses   Social Determinants of Radio broadcast assistant Strain: Low Risk  (05/31/2022)   Overall Financial Resource Strain (CARDIA)    Difficulty of Paying Living Expenses: Not hard at all  Food Insecurity: No Food Insecurity (05/31/2022)   Hunger Vital Sign    Worried About Running Out of Food in the Last Year: Never true    [place] in the Last Year: Never true  Transportation Needs: No Transportation Needs (05/31/2022)   PRAPARE - Hydrologist (Medical): No    Lack of Transportation (Non-Medical): No  Physical Activity: Inactive (05/31/2022)   Exercise Vital Sign    Days of Exercise per Week: 0 days    Minutes of Exercise per Session: 0 min  Stress: No Stress Concern Present (05/31/2022)   [place]    Feeling of Stress : Only a little  Social Connections: Moderately Isolated (05/31/2022)   Social Connection and Isolation Panel [NHANES]    Frequency of Communication with Friends and Family: More than three times a week    Frequency of Social Gatherings with Friends and Family: Twice a week    Attends Religious Services: Never    Marine scientist or Organizations: No    Attends Archivist Meetings: Never    Marital Status: Married  Tobacco  Counseling Counseling given: Not Answered   Clinical Intake:  Pre-visit preparation completed: Yes  Pain : No/denies pain     BMI - recorded: 34.22 Nutritional Status: BMI > 30  Obese Diabetes: No  How often do you need to have someone help you when you read instructions, pamphlets, or other written materials from your doctor or pharmacy?: 1 - Never  Diabetic?no  Interpreter Needed?: No  Information entered by :: Charlott Rakes, LPN   Activities of Daily Living    05/31/2022    3:04 PM  In your present state of health, do you have any difficulty performing the following activities:  Hearing? 1  Comment wears  heasring aids  Vision? 0  Difficulty concentrating or making decisions? 0  Walking or climbing stairs? 1  Comment with knees  Dressing or bathing? 0  Doing errands, shopping? 0  Preparing Food and eating ? N  Using the Toilet? N  In the past six months, have you accidently leaked urine? Y  Comment wears a pad  Do you have problems with loss of bowel control? N  Managing your Medications? N  Managing your Finances? N  Housekeeping or managing your Housekeeping? N    Patient Care Team: [name], MD as PCP - General (Family Medicine) [name], MD as PCP - Cardiology (Cardiology) Gerda Diss, DO as Consulting Physician (Sports Medicine) Cameron Sprang, MD as Consulting Physician (Neurology) Chesley Mires, MD as Consulting Physician (Pulmonary Disease) Lyndal Pulley, DO as Consulting Physician (Sports Medicine) Tiajuana Amass, MD as Consulting Physician (Allergy and Immunology) Edythe Clarity, [facility] (Pharmacist)  Indicate any recent Medical Services you may have received from other than [facility] providers in the past year (date may be approximate).     Assessment:   This is a routine wellness examination for [place].  Hearing/Vision screen Hearing Screening - Comments:: Pt wears hearing aid  Vision Screening - Comments:: Pt follows  up with Dr Prudencio Burly for annual eye exams   Dietary issues and exercise activities discussed: Current Exercise Habits: The patient does not participate in regular exercise at present   Goals Addressed               This Visit's Progress     Patient Stated (pt-stated)        Lose weight and walk more        Depression Screen    06/04/2022    8:41 AM 04/02/2022    9:19 AM 01/01/2022    9:24 AM 10/01/2021    8:58 AM 09/18/2021    4:43 PM 05/22/2021   11:11 AM 05/13/2021    9:40 AM  PHQ 2/9 Scores  PHQ - 2 Score 1 2 0 '2 2 1 '$ 0  PHQ- 9 Score 1 4 0 '6 9 1 2    '$ Fall Risk    05/31/2022    3:04 PM 04/27/2022   10:19 AM 08/05/2021    8:55 AM 05/28/2021    2:11 PM 05/22/2021   11:13 AM  Fall Risk   Falls in the past year? 0 0 '1 1 1  '$ Number falls in past yr: 0 0 '1 1 1  '$ Injury with Fall? 0 0 1 0 0  Comment     wrist injury right bruise  Risk for fall due to : Impaired balance/gait;Impaired vision;Impaired mobility  History of fall(s) History of fall(s) Impaired vision;History of fall(s)  Follow up Falls prevention discussed Falls evaluation completed Falls evaluation completed  Falls prevention discussed    FALL RISK PREVENTION PERTAINING TO THE HOME:  Any stairs in or around the home? Yes  If so, are there any without handrails? No  Home free of loose throw rugs in walkways, pet beds, electrical cords, etc? Yes  Adequate lighting in your home to reduce risk of falls? Yes   ASSISTIVE DEVICES UTILIZED TO PREVENT FALLS:  Life alert? No  Use of a cane, walker or w/c? No  Grab bars in the bathroom? Yes  Shower chair or bench in shower? Yes  Elevated toilet seat or a handicapped toilet? No   TIMED UP AND GO:  Was the test performed? No .   Cognitive Function:      09/25/2014    3:39 PM  Montreal Cognitive Assessment   Visuospatial/ Executive (0/5) 5  Naming (0/3) 3  Attention: Read list of digits (0/2) 2  Attention: Read list of letters (0/1) 1  Attention: Serial 7 subtraction  starting at 100 (0/3) 3  Language: Repeat phrase (0/2) 2  Language : Fluency (0/1) 1  Abstraction (0/2) 2  Delayed Recall (0/5) 3  Orientation (0/6) 6  Total 28  Adjusted Score (based on education) 28      06/04/2022    8:45 AM 05/22/2021   11:16 AM 05/06/2020   12:09 PM 02/06/2019   11:20 AM  6CIT Screen  What Year? 0 points 0 points 0 points 0 points  What month? 0 points 0 points 0 points 0 points  What time? 0 points 0 points  0 points  Count back from 20 0 points 0 points 4 points 0 points  Months in reverse 0 points 0 points 0 points 0 points  Repeat phrase 0 points 0 points 2 points 0 points  Total Score 0 points 0 points  0 points    Immunizations Immunization History  Administered Date(s) Administered   COVID-19, mRNA, vaccine(Comirnaty)12 years and older 01/29/2022   Fluad Quad(high Dose 65+) 11/21/2018, 12/11/2020, 12/18/2021   Influenza Split 01/08/2012, 11/12/2014   Influenza Whole 12/08/2007, 12/26/2008, 12/22/2010   Influenza, High Dose Seasonal PF 11/16/2015, 01/02/2016, 01/01/2017, 01/06/2018, 02/01/2019, 11/24/2019, 01/23/2020, 01/21/2021   Influenza,inj,Quad PF,6+ Mos 11/30/2012   Influenza-Unspecified 01/12/2014, 12/21/2017   PFIZER Comirnaty(Gray Top)Covid-19 Tri-Sucrose Vaccine 07/29/2020, 01/29/2022   PFIZER(Purple Top)SARS-COV-2 Vaccination 04/28/2019, 05/23/2019, 12/21/2019, 01/23/2020   PNEUMOCOCCAL CONJUGATE-20 09/26/2021   Pfizer Covid-19 Vaccine Bivalent Booster 31yr & up 12/11/2020   Pneumococcal Conjugate-13 05/31/2014   Pneumococcal Polysaccharide-23 06/04/2006, 07/15/2012, 01/02/2016, 12/31/2016, 01/23/2020, 01/21/2021   Respiratory Syncytial Virus Vaccine,Recomb Aduvanted(Arexvy) 02/27/2022   Td 04/23/2005, 05/13/2021   Tdap 02/18/2016   Zoster Recombinat (Shingrix) 01/31/2018, 04/02/2018   Zoster, Live 01/08/2012    TDAP status: Up to date  Flu Vaccine status: Up to date  Pneumococcal vaccine status: Up to date  Covid-19 vaccine  status: Completed vaccines  Qualifies for Shingles Vaccine? Yes   Zostavax completed Yes   Shingrix Completed?: Yes  Screening Tests Health Maintenance  Topic Date Due   Medicare Annual Wellness (AWV)  06/04/2023   DTaP/Tdap/Td (4 - Td or Tdap) 05/14/2031   Pneumonia Vaccine 80 Years old  Completed   INFLUENZA VACCINE  Completed   COVID-19 Vaccine  Completed   Hepatitis C Screening  Completed   Zoster Vaccines- Shingrix  Completed   HPV VACCINES  Aged Out   COLONOSCOPY (Pts 45-473yrInsurance coverage will need to be confirmed)  Discontinued    Health Maintenance  There are no preventive care reminders to display  for this patient.   Colorectal cancer screening: No longer required.    Additional Screening:  Hepatitis C Screening:  Completed 09/04/19  Vision Screening: Recommended annual ophthalmology exams for early detection of glaucoma and other disorders of the eye. Is the patient up to date with their annual eye exam?  Yes  Who is the provider or what is the name of the office in which the patient attends annual eye exams? Dr Prudencio Burly  If pt is not established with a provider, would they like to be referred to a provider to establish care? No .   Dental Screening: Recommended annual dental exams for proper oral hygiene  Community Resource Referral / Chronic Care Management: CRR required this visit?  No   CCM required this visit?  No      Plan:     I have personally reviewed and noted the following in the patient's chart:   Medical and social history Use of alcohol, tobacco or illicit drugs  Current medications and supplements including opioid prescriptions. Patient is not currently taking opioid prescriptions. Functional ability and status Nutritional status Physical activity Advanced directives List of other physicians Hospitalizations, surgeries, and ER visits in previous 12 months Vitals Screenings to include cognitive, depression, and falls Referrals  and appointments  In addition, I have reviewed and discussed with patient certain preventive protocols, quality metrics, and best practice recommendations. A written personalized care plan for preventive services as well as general preventive health recommendations were provided to patient.     Willette Brace, LPN   [id number]   Nurse Notes: none

## 2022-06-04 NOTE — Patient Instructions (Signed)
Joshua [name] , Thank you for taking time to come for your Medicare Wellness Visit. I appreciate your ongoing commitment to your health goals. Please review the following plan we discussed and let me know if I can assist you in the future.   These are the goals we discussed:  Goals      Exercise 150 minutes per week (moderate activity)     To get back to the fitness center  \     Patient Stated     To get rid of this pain      Patient Stated     Stay healthy and mentally stable      [place] (see longitudinal plan of care for additional care plan information)  Current Barriers:  Chronic Disease Management support, education, and care coordination needs related to Hypertension and Hyperlipidemia   Hypertension BP Readings from Last 3 Encounters:  03/20/20 118/82  02/23/20 126/70  02/09/20 139/77  Pharmacist Clinical Goal(s): Over the next 365 days, patient will work with PharmD and providers to maintain BP goal <130/80 Current regimen:  Losartan 100 mg daily  Amlodipine 5 mg once daily HCTZ 25 mg once daily Interventions: Reviewed diet and exercise - Maintain a healthy weight and exercise regularly, as directed by your health care provider. Eat healthy foods, such as: Lean proteins, complex carbohydrates, fresh fruits and vegetables, low-fat dairy products, healthy fats. Patient self care activities - Over the next 365 days, patient will: Check BP at least once every 1-2 weeks, document, and provide at future appointments Ensure daily salt intake < 2300 mg/day  Hyperlipidemia Lab Results  Component Value Date/Time   LDLCALC 84 02/23/2020 09:33 AM   LDLDIRECT 115.0 06/04/2016 09:19 AM  Pharmacist Clinical Goal(s): Over the next 180 days, patient will work with PharmD and providers to achieve LDL goal < 70 Current regimen:  Simvastatin 20 mg Zetia 10 mg once daily Interventions: Reviewed alternative cholesterol medications - offered patient  assistance support -  Patient self care activities - Over the next 90 days, patient will: Let pharmacist know if there are any questions or financial concerns   Medication management Pharmacist Clinical Goal(s): Over the next 90 days, patient will work with PharmD and providers to achieve optimal medication adherence Current pharmacy: [facility] Interventions Comprehensive medication review performed. Continue current medication management strategy. Discussed pharmacy services [facility]. Patient self care activities - Over the next 90 days, patient will: Focus on medication adherence by taking medications as prescribed Report any questions or concerns to PharmD and/or provider(s) Initial goal documentation.     Weight (lb) < 190 lb (86.2 kg)     Keep cutting back on portions Keep exercising         This is a list of the screening recommended for you and due dates:  Health Maintenance  Topic Date Due   Medicare Annual Wellness Visit  06/04/2023   DTaP/Tdap/Td vaccine (4 - Td or Tdap) 05/14/2031   Pneumonia Vaccine  Completed   Flu Shot  Completed   COVID-19 Vaccine  Completed   Hepatitis C Screening: USPSTF Recommendation to screen - Ages 63-79 yo.  Completed   Zoster (Shingles) Vaccine  Completed   HPV Vaccine  Aged Out   Colon Cancer Screening  Discontinued    Advanced directives: copies in chart   Conditions/risks identified: lose weight and walk more   Next appointment: Follow up in one year for your annual  wellness visit.   Preventive Care 40 Years and Older, Male  Preventive care refers to lifestyle choices and visits with your health care provider that can promote health and wellness. What does preventive care include? A yearly physical exam. This is also called an annual well check. Dental exams once or twice a year. Routine eye exams. Ask your health care provider how often you should have your eyes checked. Personal lifestyle choices,  including: Daily care of your teeth and gums. Regular physical activity. Eating a healthy diet. Avoiding tobacco and drug use. Limiting alcohol use. Practicing safe sex. Taking low doses of aspirin every day. Taking vitamin and mineral supplements as recommended by your health care provider. What happens during an annual well check? The services and screenings done by your health care provider during your annual well check will depend on your age, overall health, lifestyle risk factors, and family history of disease. Counseling  Your health care provider may ask you questions about your: Alcohol use. Tobacco use. Drug use. Emotional well-being. Home and relationship well-being. Sexual activity. Eating habits. History of falls. Memory and ability to understand (cognition). Work and work Statistician. Screening  You may have the following tests or measurements: Height, weight, and BMI. Blood pressure. Lipid and cholesterol levels. These may be checked every 5 years, or more frequently if you are over 1 years old. Skin check. Lung cancer screening. You may have this screening every year starting at age 41 if you have a 30-pack-year history of smoking and currently smoke or have quit within the past 15 years. Fecal occult blood test (FOBT) of the stool. You may have this test every year starting at age 40. Flexible sigmoidoscopy or colonoscopy. You may have a sigmoidoscopy every 5 years or a colonoscopy every 10 years starting at age 17. Prostate cancer screening. Recommendations will vary depending on your family history and other risks. Hepatitis C blood test. Hepatitis B blood test. Sexually transmitted disease (STD) testing. Diabetes screening. This is done by checking your blood sugar (glucose) after you have not eaten for a while (fasting). You may have this done every 1-3 years. Abdominal aortic aneurysm (AAA) screening. You may need this if you are a current or former  smoker. Osteoporosis. You may be screened starting at age 23 if you are at high risk. Talk with your health care provider about your test results, treatment options, and if necessary, the need for more tests. Vaccines  Your health care provider may recommend certain vaccines, such as: Influenza vaccine. This is recommended every year. Tetanus, diphtheria, and acellular pertussis (Tdap, Td) vaccine. You may need a Td booster every 10 years. Zoster vaccine. You may need this after age 61. Pneumococcal 13-valent conjugate (PCV13) vaccine. One dose is recommended after age 15. Pneumococcal polysaccharide (PPSV23) vaccine. One dose is recommended after age 58. Talk to your health care provider about which screenings and vaccines you need and how often you need them. This information is not intended to replace advice given to you by your health care provider. Make sure you discuss any questions you have with your health care provider. Document Released: 04/05/2015 Document Revised: 11/27/2015 Document Reviewed: 01/08/2015 Elsevier Interactive Patient Education  2017 [place] Prevention in the Home Falls can cause injuries. They can happen to people of all ages. There are many things you can do to make your home safe and to help prevent falls. What can I do on the outside of my home? Regularly fix the  edges of walkways and driveways and fix any cracks. Remove anything that might make you trip as you walk through a door, such as a raised step or threshold. Trim any bushes or trees on the path to your home. Use bright outdoor lighting. Clear any walking paths of anything that might make someone trip, such as rocks or tools. Regularly check to see if handrails are loose or broken. Make sure that both sides of any steps have handrails. Any raised decks and porches should have guardrails on the edges. Have any leaves, snow, or ice cleared regularly. Use sand or salt on walking paths during  winter. Clean up any spills in your garage right away. This includes oil or grease spills. What can I do in the bathroom? Use night lights. Install grab bars by the toilet and in the tub and shower. Do not use towel bars as grab bars. Use non-skid mats or decals in the tub or shower. If you need to sit down in the shower, use a plastic, non-slip stool. Keep the floor dry. Clean up any water that spills on the floor as soon as it happens. Remove soap buildup in the tub or shower regularly. Attach bath mats securely with double-sided non-slip rug tape. Do not have throw rugs and other things on the floor that can make you trip. What can I do in the bedroom? Use night lights. Make sure that you have a light by your bed that is easy to reach. Do not use any sheets or blankets that are too big for your bed. They should not hang down onto the floor. Have a firm chair that has side arms. You can use this for support while you get dressed. Do not have throw rugs and other things on the floor that can make you trip. What can I do in the kitchen? Clean up any spills right away. Avoid walking on wet floors. Keep items that you use a lot in easy-to-reach places. If you need to reach something above you, use a strong step stool that has a grab bar. Keep electrical cords out of the way. Do not use floor polish or wax that makes floors slippery. If you must use wax, use non-skid floor wax. Do not have throw rugs and other things on the floor that can make you trip. What can I do with my stairs? Do not leave any items on the stairs. Make sure that there are handrails on both sides of the stairs and use them. Fix handrails that are broken or loose. Make sure that handrails are as long as the stairways. Check any carpeting to make sure that it is firmly attached to the stairs. Fix any carpet that is loose or worn. Avoid having throw rugs at the top or bottom of the stairs. If you do have throw rugs,  attach them to the floor with carpet tape. Make sure that you have a light switch at the top of the stairs and the bottom of the stairs. If you do not have them, ask someone to add them for you. What else can I do to help prevent falls? Wear shoes that: Do not have high heels. Have rubber bottoms. Are comfortable and fit you well. Are closed at the toe. Do not wear sandals. If you use a stepladder: Make sure that it is fully opened. Do not climb a closed stepladder. Make sure that both sides of the stepladder are locked into place. Ask someone to hold it for  you, if possible. Clearly mark and make sure that you can see: Any grab bars or handrails. First and last steps. Where the edge of each step is. Use tools that help you move around (mobility aids) if they are needed. These include: Canes. Walkers. Scooters. Crutches. Turn on the lights when you go into a dark area. Replace any light bulbs as soon as they burn out. Set up your furniture so you have a clear path. Avoid moving your furniture around. If any of your floors are uneven, fix them. If there are any pets around you, be aware of where they are. Review your medicines with your doctor. Some medicines can make you feel dizzy. This can increase your chance of falling. Ask your doctor what other things that you can do to help prevent falls. This information is not intended to replace advice given to you by your health care provider. Make sure you discuss any questions you have with your health care provider. Document Released: 01/03/2009 Document Revised: 08/15/2015 Document Reviewed: 04/13/2014 Elsevier Interactive Patient Education  2017 [employer].

## 2022-06-08 ENCOUNTER — Encounter: Payer: Self-pay | Admitting: Family Medicine

## 2022-06-08 ENCOUNTER — Ambulatory Visit (INDEPENDENT_AMBULATORY_CARE_PROVIDER_SITE_OTHER): Payer: PPO | Admitting: Family Medicine

## 2022-06-08 VITALS — BP 118/72 | Ht 66.0 in | Wt 211.0 lb

## 2022-06-08 DIAGNOSIS — M1711 Unilateral primary osteoarthritis, right knee: Secondary | ICD-10-CM

## 2022-06-08 NOTE — Patient Instructions (Addendum)
Injection today See me again in 2 months

## 2022-06-08 NOTE — Assessment & Plan Note (Addendum)
Given injection and tolerated the procedure well, has had difficulty with steroids previously so did less of a dose in him.  Alternatives and discussed that patientWe discussed had difficulty with this steroid before but he needed some relief and wanted to do it.  Understood the risks and benefits. we discussed with patient about viscosupplementation and we will see if we can get approval.  No significant instability noted of the knee but patient likely did favor the knee over the course of time with patient's replacement.  Follow-up with me again in 6 to 8 weeks otherwise

## 2022-06-08 NOTE — Progress Notes (Signed)
[place] Phone: [phone] Subjective:   [name], am serving as a scribe for Dr. Hulan Saas.  I'm seeing this patient by the request  of:  [name], MD  CC: Right knee pain  [id number]  Joshua [name] is a 82 y.o. male coming in with complaint of right knee pain.  The patient did have significant difficulty with the pain needs to have discomfort of the left knee replacement. Walked 2 days in a row. Noticed swelling and an increase in pain the next day for 10 days. Using Tylenol and ice. Today his pain is better but the pain last night woke him up.       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) 04/2010   Past Surgical History:  Procedure Laterality Date   ANTERIOR CERVICAL DECOMP/DISCECTOMY FUSION  05/26/2006   PLATE PLACED   BACK SURGERY     CARDIAC CATHETERIZATION  11/20/1999   CORONARY ARTERY BYPASS GRAFT  11/20/1999   SVG-RI1-RI2, SVG-OM, SVG-dRCA   CORONARY STENT INTERVENTION N/A 04/13/2019   Procedure: CORONARY STENT INTERVENTION;  Surgeon: Burnell Blanks, MD;  Location: [facility];  Service: Cardiovascular;  Laterality: N/A;   HERNIA REPAIR     INGUINAL HERNIA REPAIR Left 01/02/2019   Procedure: OPEN LEFT INGUINAL HERNIA  REPAIR WITH MESH;  Surgeon: Johnathan Hausen, MD;  Location: [facility];  Service: General;  Laterality: Left;   KNEE ARTHROSCOPY Right 1992; 04/2008   LAPAROSCOPIC CHOLECYSTECTOMY  2007   LEFT HEART CATH AND CORS/GRAFTS ANGIOGRAPHY N/A 04/13/2019   Procedure: LEFT HEART CATH AND CORS/GRAFTS ANGIOGRAPHY;  Surgeon: Burnell Blanks, MD;  Location: [facility];  Service: Cardiovascular;  Laterality: N/A;   NASAL SEPTUM SURGERY Right 2007   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   ROBOT ASSISTED LAPAROSCOPIC RADICAL PROSTATECTOMY  04/2010   THYROIDECTOMY, PARTIAL  1980   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   Procedure: LEFT TOTAL KNEE ARTHROPLASTY;  Surgeon: Melrose Nakayama, MD;  Location: [facility];  Service: Orthopedics;  Laterality: Left;   Social History   Socioeconomic History   Marital status: Married    Spouse name: Not on file   Number of children: Not on file   Years of education: Not on file   Highest education level: Not on file  Occupational History   Occupation: retired    Fish farm manager: RETIRED  Tobacco Use   Smoking status: Former    Packs/day: 1.00    Years: 35.00    Additional pack years: 0.00    Total pack years: 35.00    Types: Cigarettes    Quit  date: 06/16/1992    Years since quitting: 29.9   Smokeless tobacco: Never  Vaping Use   Vaping Use: Never used  Substance and Sexual Activity   Alcohol use: Yes    Alcohol/week: 2.0 standard drinks of alcohol    Types: 2 Shots of liquor per week    Comment: occ   Drug use: No   Sexual activity: Not Currently    Partners: Female  Other Topics Concern   Not on file  Social History Narrative   Married 42 years in 2015. No kids (mumps at age 70)      Retired from [employer] in Engineer, technical sales.  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      Hobbies: poker, movies and tv, staying active      Right handed   2 level home with steps he uses   Social Determinants of Radio broadcast assistant Strain: Low Risk  (05/31/2022)    Overall Financial Resource Strain (CARDIA)    Difficulty of Paying Living Expenses: Not hard at all  Food Insecurity: No Food Insecurity (05/31/2022)   Hunger Vital Sign    Worried About Running Out of Food in the Last Year: Never true    [place] in the Last Year: Never true  Transportation Needs: No Transportation Needs (05/31/2022)   PRAPARE - Hydrologist (Medical): No    Lack of Transportation (Non-Medical): No  Physical Activity: Inactive (05/31/2022)   Exercise Vital Sign    Days of Exercise per Week: 0 days    Minutes of Exercise per Session: 0 min  Stress: No Stress Concern Present (05/31/2022)   [place]    Feeling of Stress : Only a little  Social Connections: Moderately Isolated (05/31/2022)   Social Connection and Isolation Panel [NHANES]    Frequency of Communication with Friends and Family: More than three times a week    Frequency of Social Gatherings with Friends and Family: Twice a week    Attends Religious Services: Never    Marine scientist or Organizations: No    Attends Music therapist: Never    Marital Status: Married   Allergies  Allergen Reactions   Benadryl [Diphenhydramine] Other (See Comments)    Pt told not to take because of bypass surgery   Bromfed Nausea And Vomiting   Clarithromycin Other (See Comments)     gastritis   Codeine Other (See Comments)    Trouble breathing   Cymbalta [Duloxetine Hcl]     Abdominal pain and later chest pain as well as worsening fatigue    Doxycycline Hyclate Nausea And Vomiting   Lexapro [Escitalopram]     Dizziness   Modafinil Other (See Comments)     anxiety-nervousness   Oxycodone-Acetaminophen Itching   Promethazine Hcl Other (See Comments)     fainting   Quinolones Nausea Only    Cipro "felt real bad"   Repatha [Evolocumab]     Weak, worn out   Telithromycin Nausea And Vomiting    Tramadol Nausea Only   Hydrocodone-Acetaminophen Rash   Statins Other (See Comments)    Leg cramps and makes patient feel bad. Per pt tried multiple in years past   Sulfonamide Derivatives Rash   Family History  Problem Relation Age of Onset   Heart disease Mother    Hyperlipidemia Mother    Heart disease Father    Hyperlipidemia Father  Diabetes Brother    Colon cancer Neg Hx    Pancreatic cancer Neg Hx    Esophageal cancer Neg Hx    Stomach cancer Neg Hx    Liver cancer Neg Hx      Current Outpatient Medications (Cardiovascular):    Alirocumab (PRALUENT) 150 MG/ML SOAJ, INJECT 1 DOSE INTO THE SKIN EVERY 14 (FOURTEEN) DAYS.   amLODipine (NORVASC) 5 MG tablet, TAKE 1 TABLET (5 MG TOTAL) BY MOUTH DAILY.   EPINEPHrine 0.3 mg/0.3 mL IJ SOAJ injection, Inject 0.3 mg into the muscle as needed for anaphylaxis. for allergic reaction   ezetimibe (ZETIA) 10 MG tablet, TAKE 1 TABLET BY MOUTH EVERY DAY   hydrochlorothiazide (HYDRODIURIL) 25 MG tablet, TAKE 1 TABLET BY MOUTH EVERY DAY   nitroGLYCERIN (NITROSTAT) 0.4 MG SL tablet, PLACE 1 TABLET UNDER THE TONGUE EVERY 5 (FIVE) MINUTES AS NEEDED.   valsartan (DIOVAN) 320 MG tablet, TAKE 1 TABLET BY MOUTH EVERY DAY  Current Outpatient Medications (Respiratory):    albuterol (PROAIR HFA) 108 (90 Base) MCG/ACT inhaler, Inhale 1-2 puffs into the lungs every 6 (six) hours as needed for wheezing or shortness of breath.   FLOVENT HFA 110 MCG/ACT inhaler, Inhale 1 puff into the lungs 2 (two) times daily.    fluticasone (FLONASE) 50 MCG/ACT nasal spray, Place 1 spray into both nostrils daily.   ipratropium (ATROVENT HFA) 17 MCG/ACT inhaler, Inhale 2 puffs into the lungs 3 (three) times daily.  Current Outpatient Medications (Analgesics):    acetaminophen (TYLENOL) 500 MG tablet, Take 1,000 mg by mouth every 6 (six) hours as needed for moderate pain or headache.   aspirin EC 81 MG tablet, Take 1 tablet (81 mg total) by mouth 2 (two) times daily after  a meal.  Current Outpatient Medications (Hematological):    Cyanocobalamin (B-12) 2500 MCG TABS, Take 1,250 mcg by mouth daily.   folic acid (FOLVITE) [id number] MCG tablet, Take 800 mcg by mouth daily.   Current Outpatient Medications (Other):    cholecalciferol (VITAMIN D3) 25 MCG (1000 UNIT) tablet, Take 1,000 Units by mouth daily.   clobetasol cream (TEMOVATE) 0.05 %, APPLY TO AFFECTED AREA TWICE A DAY See Dr. Yong Channel soon and discuss about further refills.   diazepam (VALIUM) 5 MG tablet, Take 1 tablet (5 mg total) by mouth at bedtime as needed (sleep).   gabapentin (NEURONTIN) 300 MG capsule, Take 2 cap in AM, 1 cap at noon, 2caps at bedtime   GEMTESA 75 MG TABS, TAKE 1 TABLET BY MOUTH EVERY DAY   Glucos-Chond-Hyal Ac-Ca Fructo (MOVE FREE JOINT HEALTH ADVANCE PO), Take 1 tablet by mouth daily.   ketoconazole (NIZORAL) 2 % cream, Apply 1 application topically See admin instructions. Twice a day every other day   Miconazole Nitrate (LOTRIMIN AF DEODORANT POWDER) 2 % AERP, Apply 1 spray topically daily as needed (jock itch).   pantoprazole (PROTONIX) 40 MG tablet, TAKE 1 TABLET BY MOUTH EVERY DAY   polyethylene glycol powder (GLYCOLAX/MIRALAX) powder, Take 17 g by mouth daily.   silver sulfADIAZINE (SILVADENE) 1 % cream, Apply 1 Application topically daily.   Wheat Dextrin (BENEFIBER) POWD, Take 1 Dose by mouth daily. 1 dose = 2 teaspoons   Reviewed prior external information including notes and imaging from  primary care provider As well as notes that were available from care everywhere and other healthcare systems.  Reviewed outside records and seeing cardiology for bilateral thigh pain recently.  Did have laboratory workup showing a mild GFR function but otherwise labs  fairly unremarkable  Past medical history, social, surgical and family history all reviewed in electronic medical record.  No pertanent information unless stated regarding to the chief complaint.   Review of Systems:  No  headache, visual changes, nausea, vomiting, diarrhea, constipation, dizziness, abdominal pain, skin rash, fevers, chills, night sweats, weight loss, swollen lymph nodes, chest pain, shortness of breath, mood changes. POSITIVE muscle aches, joint swelling  Objective  Blood pressure 118/72, height 5\' 6"  (1.676 m), weight 211 lb (95.7 kg).   General: No apparent distress alert and oriented x3 mood and affect normal, dressed appropriately.  HEENT: Pupils equal, extraocular movements intact  Respiratory: Patient's speak in full sentences and does not appear short of breath  Cardiovascular: No lower extremity edema, non tender, no erythema  Antalgic gait noted patient is favoring the right knee though.  Patient is tender to palpation over the medial joint space.  Left knee seems to be unremarkable.  After informed written and verbal consent, patient was seated on exam table. Right knee was prepped with alcohol swab and utilizing anterolateral approach, patient's right knee space was injected with 4:1  marcaine 0.5%: Kenalog 40mg /dL. Patient tolerated the procedure well without immediate complications.   Impression and Recommendations:     The above documentation has been reviewed and is accurate and complete Lyndal Pulley, DO

## 2022-06-11 DIAGNOSIS — J3081 Allergic rhinitis due to animal (cat) (dog) hair and dander: Secondary | ICD-10-CM | POA: Diagnosis not present

## 2022-06-11 DIAGNOSIS — J3089 Other allergic rhinitis: Secondary | ICD-10-CM | POA: Diagnosis not present

## 2022-06-11 DIAGNOSIS — J301 Allergic rhinitis due to pollen: Secondary | ICD-10-CM | POA: Diagnosis not present

## 2022-06-12 DIAGNOSIS — G4733 Obstructive sleep apnea (adult) (pediatric): Secondary | ICD-10-CM | POA: Diagnosis not present

## 2022-06-24 DIAGNOSIS — J301 Allergic rhinitis due to pollen: Secondary | ICD-10-CM | POA: Diagnosis not present

## 2022-06-24 DIAGNOSIS — J3089 Other allergic rhinitis: Secondary | ICD-10-CM | POA: Diagnosis not present

## 2022-06-24 DIAGNOSIS — J3081 Allergic rhinitis due to animal (cat) (dog) hair and dander: Secondary | ICD-10-CM | POA: Diagnosis not present

## 2022-06-25 ENCOUNTER — Ambulatory Visit (INDEPENDENT_AMBULATORY_CARE_PROVIDER_SITE_OTHER): Payer: PPO

## 2022-06-25 ENCOUNTER — Ambulatory Visit: Payer: PPO | Admitting: Sports Medicine

## 2022-06-25 VITALS — BP 122/80 | HR 71 | Ht 66.0 in | Wt 212.0 lb

## 2022-06-25 DIAGNOSIS — M1711 Unilateral primary osteoarthritis, right knee: Secondary | ICD-10-CM

## 2022-06-25 DIAGNOSIS — M25561 Pain in right knee: Secondary | ICD-10-CM | POA: Diagnosis not present

## 2022-06-25 DIAGNOSIS — G8929 Other chronic pain: Secondary | ICD-10-CM | POA: Diagnosis not present

## 2022-06-25 NOTE — Patient Instructions (Addendum)
Good to see you - Recommend taking Tylenol 325 mg 1 tablet in the morning, and 1 tablet 8 hours later.  May continue taking Tylenol 650 mg nightly - Start HEP for knee Keep appointment with Dr. Tamala Julian

## 2022-06-25 NOTE — Progress Notes (Signed)
Joshua [name] D.[place] [facility] [place] Phone: [phone]   Assessment and Plan:     1. Chronic pain of right knee 2. Primary osteoarthritis of right knee  -Chronic with exacerbation, subsequent sports medicine visit - Significant knee pain worsening over the past 3 to 4 days with most recent intra-articular CSI being performed on 06/08/2022 only providing mild relief - X-ray obtained in clinic.  My interpretation: No acute fracture or dislocation.  Moderately decreased joint space in medial compartments.  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  Moderately decreased joint space and patellofemoral compartment with patellar spurring. - Recommend taking Tylenol 325 mg 1 tablet in the morning, and 1 tablet 8 hours later.  May continue taking Tylenol 650 mg nightly - Start HEP for knee  Pertinent previous records reviewed include none   Follow Up: 2 weeks for reevaluation to see if this treatment plan is beneficial for patient.  Patient has history of total left knee replacement and may ultimately require total right knee replacement.  Patient has a strong negative reaction to CSI where he will feel sick for nearly 1 week after injection which was also present after most recent CSI.  Patient may benefit from Zilretta injections in the future which may cause less significant reactions   Subjective:   I, Moenique Parris, am serving as a Education administrator for Doctor Glennon Mac  Chief Complaint: right knee pain   HPI:  06/08/2022 Joshua [name] is a 82 y.o. male coming in with complaint of right knee pain.  The patient did have significant difficulty with the pain needs to have discomfort of the left knee replacement. Walked 2 days in a row. Noticed swelling and an increase in pain the next day for 10 days. Using Tylenol and ice. Today his pain is better but the pain last night woke him up.   06/25/22 Patient states  that Saturday or Sunday he rode the stationary bike and the next day he was in some pain this morning he was in a some  , but he has been improving since this morning    Relevant Historical Information: History of total left knee replacement, hypertension, GERD, CKD  Additional pertinent review of systems negative.   Current Outpatient Medications:    acetaminophen (TYLENOL) 500 MG tablet, Take 1,000 mg by mouth every 6 (six) hours as needed for moderate pain or headache., Disp: , Rfl:    albuterol (PROAIR HFA) 108 (90 Base) MCG/ACT inhaler, Inhale 1-2 puffs into the lungs every 6 (six) hours as needed for wheezing or shortness of breath., Disp: 18 g, Rfl: 5   Alirocumab (PRALUENT) 150 MG/ML SOAJ, INJECT 1 DOSE INTO THE SKIN EVERY 14 (FOURTEEN) DAYS., Disp: 6 mL, Rfl: 3   amLODipine (NORVASC) 5 MG tablet, TAKE 1 TABLET (5 MG TOTAL) BY MOUTH DAILY., Disp: 90 tablet, Rfl: 3   aspirin EC 81 MG tablet, Take 1 tablet (81 mg total) by mouth 2 (two) times daily after a meal., Disp: 30 tablet, Rfl: 11   cholecalciferol (VITAMIN D3) 25 MCG (1000 UNIT) tablet, Take 1,000 Units by mouth daily., Disp: , Rfl:    clobetasol cream (TEMOVATE) 0.05 %, APPLY TO AFFECTED AREA TWICE A DAY See Dr. Yong Channel soon and discuss about further refills., Disp: 30 g, Rfl: 0   Cyanocobalamin (B-12) 2500 MCG TABS, Take 1,250 mcg by mouth daily., Disp: , Rfl:    diazepam (VALIUM) 5 MG tablet, Take 1 tablet (  5 mg total) by mouth at bedtime as needed (sleep)., Disp: 30 tablet, Rfl: 1   EPINEPHrine 0.3 mg/0.3 mL IJ SOAJ injection, Inject 0.3 mg into the muscle as needed for anaphylaxis. for allergic reaction, Disp: , Rfl:    ezetimibe (ZETIA) 10 MG tablet, TAKE 1 TABLET BY MOUTH EVERY DAY, Disp: 90 tablet, Rfl: 2   FLOVENT HFA 110 MCG/ACT inhaler, Inhale 1 puff into the lungs 2 (two) times daily. , Disp: , Rfl: 5   fluticasone (FLONASE) 50 MCG/ACT nasal spray, Place 1 spray into both nostrils daily., Disp: , Rfl:    folic acid  (FOLVITE) [id number] MCG tablet, Take 800 mcg by mouth daily. , Disp: , Rfl:    gabapentin (NEURONTIN) 300 MG capsule, Take 2 cap in AM, 1 cap at noon, 2caps at bedtime, Disp: 450 capsule, Rfl: 3   GEMTESA 75 MG TABS, TAKE 1 TABLET BY MOUTH EVERY DAY, Disp: 90 tablet, Rfl: 3   Glucos-Chond-Hyal Ac-Ca Fructo (MOVE FREE JOINT HEALTH ADVANCE PO), Take 1 tablet by mouth daily., Disp: , Rfl:    hydrochlorothiazide (HYDRODIURIL) 25 MG tablet, TAKE 1 TABLET BY MOUTH EVERY DAY, Disp: 90 tablet, Rfl: 2   ipratropium (ATROVENT HFA) 17 MCG/ACT inhaler, Inhale 2 puffs into the lungs 3 (three) times daily., Disp: 1 each, Rfl: 1   ketoconazole (NIZORAL) 2 % cream, Apply 1 application topically See admin instructions. Twice a day every other day, Disp: , Rfl:    Miconazole Nitrate (LOTRIMIN AF DEODORANT POWDER) 2 % AERP, Apply 1 spray topically daily as needed (jock itch)., Disp: , Rfl:    nitroGLYCERIN (NITROSTAT) 0.4 MG SL tablet, PLACE 1 TABLET UNDER THE TONGUE EVERY 5 (FIVE) MINUTES AS NEEDED., Disp: 75 tablet, Rfl: 1   pantoprazole (PROTONIX) 40 MG tablet, TAKE 1 TABLET BY MOUTH EVERY DAY, Disp: 90 tablet, Rfl: 3   polyethylene glycol powder (GLYCOLAX/MIRALAX) powder, Take 17 g by mouth daily., Disp: 255 g, Rfl: 0   silver sulfADIAZINE (SILVADENE) 1 % cream, Apply 1 Application topically daily., Disp: , Rfl:    valsartan (DIOVAN) 320 MG tablet, TAKE 1 TABLET BY MOUTH EVERY DAY, Disp: 90 tablet, Rfl: 3   Wheat Dextrin (BENEFIBER) POWD, Take 1 Dose by mouth daily. 1 dose = 2 teaspoons, Disp: , Rfl:    Objective:     Vitals:   06/25/22 1445  BP: 122/80  Pulse: 71  SpO2: 94%  Weight: 212 lb (96.2 kg)  Height: 5\' 6"  (1.676 m)      Body mass index is 34.22 kg/m.    Physical Exam:    General:  awake, alert oriented, no acute distress nontoxic Skin: no suspicious lesions or rashes Neuro:sensation intact and strength 5/5 with no deficits, no atrophy, normal muscle tone Psych: No signs of anxiety, depression  or other mood disorder  Right knee: No swelling No deformity Neg fluid wave, joint milking ROM Flex 100, Ext 0 TTP moderately medial joint line and medial femoral condyle, mildly to lateral joint line, quadriceps tendon NTTP over the  , lat fem condyle, patella, plica, patella tendon, tibial tuberostiy, fibular head, posterior fossa, pes anserine bursa, gerdy's tubercle,   Neg anterior and posterior drawer Neg lachman Neg sag sign Negative varus stress Negative valgus stress Positive McMurray for pain, negative for palpable pop    Gait antalgic, favoring left leg   Electronically signed by:  Joshua [name] D.[facility] 3:26 PM 06/25/22

## 2022-06-29 DIAGNOSIS — E785 Hyperlipidemia, unspecified: Secondary | ICD-10-CM | POA: Diagnosis not present

## 2022-06-30 LAB — LIPID PANEL
Chol/HDL Ratio: 2.7 ratio (ref 0.0–5.0)
Cholesterol, Total: 115 mg/dL (ref 100–199)
HDL: 42 mg/dL (ref >39)
LDL Chol Calc (NIH): 46 mg/dL (ref 0–99)
Triglycerides: 159 mg/dL — ABNORMAL HIGH (ref 0–149)
VLDL Cholesterol Cal: 27 mg/dL (ref 5–40)

## 2022-07-01 DIAGNOSIS — J301 Allergic rhinitis due to pollen: Secondary | ICD-10-CM | POA: Diagnosis not present

## 2022-07-01 DIAGNOSIS — J4530 Mild persistent asthma, uncomplicated: Secondary | ICD-10-CM | POA: Diagnosis not present

## 2022-07-01 DIAGNOSIS — J3089 Other allergic rhinitis: Secondary | ICD-10-CM | POA: Diagnosis not present

## 2022-07-01 DIAGNOSIS — J3081 Allergic rhinitis due to animal (cat) (dog) hair and dander: Secondary | ICD-10-CM | POA: Diagnosis not present

## 2022-07-03 ENCOUNTER — Encounter: Payer: Self-pay | Admitting: Family Medicine

## 2022-07-03 ENCOUNTER — Ambulatory Visit (INDEPENDENT_AMBULATORY_CARE_PROVIDER_SITE_OTHER): Payer: PPO | Admitting: Family Medicine

## 2022-07-03 VITALS — BP 100/62 | HR 84 | Temp 97.7°F | Ht 66.0 in | Wt 210.0 lb

## 2022-07-03 DIAGNOSIS — R739 Hyperglycemia, unspecified: Secondary | ICD-10-CM

## 2022-07-03 DIAGNOSIS — F325 Major depressive disorder, single episode, in full remission: Secondary | ICD-10-CM | POA: Diagnosis not present

## 2022-07-03 DIAGNOSIS — Z131 Encounter for screening for diabetes mellitus: Secondary | ICD-10-CM

## 2022-07-03 DIAGNOSIS — E785 Hyperlipidemia, unspecified: Secondary | ICD-10-CM | POA: Diagnosis not present

## 2022-07-03 DIAGNOSIS — Z0000 Encounter for general adult medical examination without abnormal findings: Secondary | ICD-10-CM

## 2022-07-03 DIAGNOSIS — D692 Other nonthrombocytopenic purpura: Secondary | ICD-10-CM | POA: Diagnosis not present

## 2022-07-03 DIAGNOSIS — G629 Polyneuropathy, unspecified: Secondary | ICD-10-CM | POA: Diagnosis not present

## 2022-07-03 DIAGNOSIS — I2510 Atherosclerotic heart disease of native coronary artery without angina pectoris: Secondary | ICD-10-CM

## 2022-07-03 DIAGNOSIS — I700 Atherosclerosis of aorta: Secondary | ICD-10-CM | POA: Diagnosis not present

## 2022-07-03 NOTE — Patient Instructions (Addendum)
Glad you are doing well outside of the stomach issues -could get follow up with gastroenterology OR try Fermentable, Oligo-saccharides, Di-saccharides, Mono-saccharides, and Polyols diet if you decide (FODMAP )  Recommended follow up: Return in about 3 months (around 10/02/2022) for followup or sooner if needed.Schedule b4 you leave.

## 2022-07-03 NOTE — Progress Notes (Signed)
Phone: [phone]   Subjective:  Patient presents today for their annual physical. Chief complaint-noted.   See problem oriented charting- ROS- full  review of systems was completed and negative  except for: Sore on forehead, bilateral knee pain, loose stool, drooling, tinnitus, cough with lunch, leg swelling no worse, nausea, cold intollerance, difficulty urinating, pain with urination, genital sore- has seen derm, joint pain, gait problem, dizziness, numbness, weakness, sad mood  The following were reviewed and entered/updated in epic: Past Medical History:  Diagnosis Date   Acute medial meniscal tear    Anemia 2015   Arthritis    "middle finger right hand; right knee; neck" (02/06/2014)   Asthma    seasonal   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   "cauterized during prostate OR"   CAD (coronary artery disease)    CABG 2001   Diverticulosis    DJD (degenerative joint disease)    BACK   GERD (gastroesophageal reflux disease)    H/O inguinal hernia repair 12/2018   History of gout    Hyperlipidemia    Hypertension    IBS (irritable bowel syndrome)    Obesity    OSA (obstructive sleep apnea)    USES CPAP    Pancreatitis ~ 1980   Peripheral vascular disease    Pre-diabetes    Prostate cancer 04/2010   Patient Active Problem List   Diagnosis Date Noted   Coronary atherosclerosis 10/12/2006    Priority: High   Irritable bowel syndrome 10/12/2006    Priority: High   Overactive bladder 11/24/2019    Priority: Medium    Hyperglycemia 10/07/2017    Priority: Medium    Gout 11/20/2016    Priority: Medium    CKD (chronic kidney disease), stage III (HCC) 10/23/2013    Priority: Medium    Neuropathy (HCC) 08/21/2013    Priority: Medium    History of bladder cancer 04/01/2010    Priority: Medium    Essential hypertension 02/21/2007    Priority: Medium    Hyperlipidemia 10/12/2006    Priority: Medium    Major depression in full remission 10/12/2006    Priority: Medium    Asthma  10/12/2006    Priority: Medium    Aortic atherosclerosis 05/05/2018    Priority: Low   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    Priority: Low   DJD (degenerative joint disease)     Priority: Low   Arthritis     Priority: Low   Neck pain 02/08/2017    Priority: Low   Venous insufficiency 12/29/2016    Priority: Low   Ganglion cyst of tendon sheath of right hand 12/29/2016    Priority: Low   Avulsion fracture of metatarsal bone of right foot 11/25/2016    Priority: Low   Obesity, Class I, BMI 30-34.9 10/03/2015    Priority: Low   Allergic rhinitis 05/31/2014    Priority: Low   Fatigue 11/09/2013    Priority: Low   Dizziness 08/23/2013    Priority: Low   RLS (restless legs syndrome) 01/03/2013    Priority: Low   Chronic bilateral low back pain with left-sided sciatica 10/09/2011    Priority: Low   OSTEOARTHRITIS, LOWER LEG, LEFT 10/04/2009    Priority: Low   ANEMIA, IRON DEFICIENCY 11/06/2008    Priority: Low   TINNITUS, CHRONIC, 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    Priority: Low   OSA (obstructive sleep apnea) 11/02/2006    Priority: Low   GERD 10/12/2006    Priority: Low   Degenerative arthritis of right knee  06/08/2022   Left hamstring injury 04/21/2022   Degenerative cervical disc 01/12/2022   Greater trochanteric bursitis of right hip 02/17/2021   Hyperactive bowel sounds 01/29/2021   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 09/25/2020   Primary osteoarthritis of left knee 06/18/2020   Pre-operative respiratory examination 06/05/2020   Coronary artery disease involving coronary bypass graft of native heart without angina pectoris 04/07/2020   Trigger thumb, left thumb 01/24/2020   Left lateral epicondylitis 09/05/2019   Degenerative arthritis of left knee 08/08/2019   Educated about COVID-19 virus infection 04/20/2019   Unstable angina    Greater trochanteric bursitis of left hip 10/27/2018   Degenerative disc disease, lumbar 10/27/2018   Abdominal muscle  strain, initial encounter 06/16/2018   Weakness of left leg 06/21/2017   Hereditary and idiopathic peripheral neuropathy 09/25/2014   Generalized abdominal pain 01/31/2009   Past Surgical History:  Procedure Laterality Date   ANTERIOR CERVICAL DECOMP/DISCECTOMY FUSION  05/26/2006   PLATE PLACED   BACK SURGERY     CARDIAC CATHETERIZATION  11/20/1999   CORONARY ARTERY BYPASS GRAFT  11/20/1999   SVG-RI1-RI2, SVG-OM, SVG-dRCA   CORONARY STENT INTERVENTION N/A 04/13/2019   Procedure: CORONARY STENT INTERVENTION;  Surgeon: Kathleene Hazel, MD;  Location: [facility];  Service: Cardiovascular;  Laterality: N/A;   HERNIA REPAIR     INGUINAL HERNIA REPAIR Left 01/02/2019   Procedure: OPEN LEFT INGUINAL HERNIA REPAIR WITH MESH;  Surgeon: Luretha Murphy, MD;  Location: [facility];  Service: General;  Laterality: Left;   KNEE ARTHROSCOPY Right 1992; 04/2008   LAPAROSCOPIC CHOLECYSTECTOMY  2007   LEFT HEART CATH AND CORS/GRAFTS ANGIOGRAPHY N/A 04/13/2019   Procedure: LEFT HEART CATH AND CORS/GRAFTS ANGIOGRAPHY;  Surgeon: Kathleene Hazel, MD;  Location: [facility];  Service: Cardiovascular;  Laterality: N/A;   NASAL SEPTUM SURGERY Right 2007   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   ROBOT ASSISTED LAPAROSCOPIC RADICAL PROSTATECTOMY  04/2010   THYROIDECTOMY, PARTIAL  1980   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   Procedure: LEFT TOTAL KNEE ARTHROPLASTY;  Surgeon: Marcene Corning, MD;  Location: [facility];  Service: Orthopedics;  Laterality: Left;    Family History  Problem Relation Age of Onset   Heart disease Mother    Hyperlipidemia Mother    Heart disease Father    Hyperlipidemia Father    Diabetes Brother    Colon cancer Neg Hx    Pancreatic cancer Neg Hx    Esophageal cancer Neg Hx    Stomach cancer Neg Hx    Liver cancer Neg Hx     Medications- reviewed and updated Current Outpatient Medications  Medication Sig Dispense Refill   acetaminophen (TYLENOL) 500 MG  tablet Take 1,000 mg by mouth every 6 (six) hours as needed for moderate pain or headache.     albuterol (PROAIR HFA) 108 (90 Base) MCG/ACT inhaler Inhale 1-2 puffs into the lungs every 6 (six) hours as needed for wheezing or shortness of breath. 18 g 5   Alirocumab (PRALUENT) 150 MG/ML SOAJ INJECT 1 DOSE INTO THE SKIN EVERY 14 (FOURTEEN) DAYS. 6 mL 3   amLODipine (NORVASC) 5 MG tablet TAKE 1 TABLET (5 MG TOTAL) BY MOUTH DAILY. 90 tablet 3   aspirin EC 81 MG tablet Take 1 tablet (81 mg total) by mouth 2 (two) times daily after a meal. 30 tablet 11   cholecalciferol (VITAMIN D3) 25 MCG (1000 UNIT) tablet Take 1,000 Units by mouth daily.  clobetasol cream (TEMOVATE) 0.05 % APPLY TO AFFECTED AREA TWICE A DAY See Dr. Durene Cal soon and discuss about further refills. 30 g 0   Cyanocobalamin (B-12) 2500 MCG TABS Take 1,250 mcg by mouth daily.     diazepam (VALIUM) 5 MG tablet Take 1 tablet (5 mg total) by mouth at bedtime as needed (sleep). 30 tablet 1   EPINEPHrine 0.3 mg/0.3 mL IJ SOAJ injection Inject 0.3 mg into the muscle as needed for anaphylaxis. for allergic reaction     ezetimibe (ZETIA) 10 MG tablet TAKE 1 TABLET BY MOUTH EVERY DAY 90 tablet 2   FLOVENT HFA 110 MCG/ACT inhaler Inhale 1 puff into the lungs 2 (two) times daily.   5   fluticasone (FLONASE) 50 MCG/ACT nasal spray Place 1 spray into both nostrils daily.     folic acid (FOLVITE) 800 MCG tablet Take 800 mcg by mouth daily.      gabapentin (NEURONTIN) 300 MG capsule Take 2 cap in AM, 1 cap at noon, 2caps at bedtime 450 capsule 3   GEMTESA 75 MG TABS TAKE 1 TABLET BY MOUTH EVERY DAY 90 tablet 3   Glucos-Chond-Hyal Ac-Ca Fructo (MOVE FREE JOINT HEALTH ADVANCE PO) Take 1 tablet by mouth daily.     hydrochlorothiazide (HYDRODIURIL) 25 MG tablet TAKE 1 TABLET BY MOUTH EVERY DAY 90 tablet 2   ipratropium (ATROVENT HFA) 17 MCG/ACT inhaler Inhale 2 puffs into the lungs 3 (three) times daily. 1 each 1   ketoconazole (NIZORAL) 2 % cream Apply 1  application topically See admin instructions. Twice a day every other day     Miconazole Nitrate (LOTRIMIN AF DEODORANT POWDER) 2 % AERP Apply 1 spray topically daily as needed (jock itch).     nitroGLYCERIN (NITROSTAT) 0.4 MG SL tablet PLACE 1 TABLET UNDER THE TONGUE EVERY 5 (FIVE) MINUTES AS NEEDED. 75 tablet 1   pantoprazole (PROTONIX) 40 MG tablet TAKE 1 TABLET BY MOUTH EVERY DAY 90 tablet 3   polyethylene glycol powder (GLYCOLAX/MIRALAX) powder Take 17 g by mouth daily. 255 g 0   silver sulfADIAZINE (SILVADENE) 1 % cream Apply 1 Application topically daily.     valsartan (DIOVAN) 320 MG tablet TAKE 1 TABLET BY MOUTH EVERY DAY 90 tablet 3   Wheat Dextrin (BENEFIBER) POWD Take 1 Dose by mouth daily. 1 dose = 2 teaspoons     No current facility-administered medications for this visit.    Allergies-reviewed and updated Allergies  Allergen Reactions   Benadryl [Diphenhydramine] Other (See Comments)    Pt told not to take because of bypass surgery   Bromfed Nausea And Vomiting   Clarithromycin Other (See Comments)     gastritis   Codeine Other (See Comments)    Trouble breathing   Cymbalta [Duloxetine Hcl]     Abdominal pain and later chest pain as well as worsening fatigue    Doxycycline Hyclate Nausea And Vomiting   Lexapro [Escitalopram]     Dizziness   Modafinil Other (See Comments)     anxiety-nervousness   Oxycodone-Acetaminophen Itching   Promethazine Hcl Other (See Comments)     fainting   Quinolones Nausea Only    Cipro "felt real bad"   Repatha [Evolocumab]     Weak, worn out   Telithromycin Nausea And Vomiting   Tramadol Nausea Only   Hydrocodone-Acetaminophen Rash   Statins Other (See Comments)    Leg cramps and makes patient feel bad. Per pt tried multiple in years past   Sulfonamide Derivatives Rash  Social History   Social History Narrative   Married 42 years in 2015. 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 Oropharynx normal Neck: no thyromegaly CV: RRR no murmurs rubs or gallops Lungs: CTAB no crackles, wheeze, rhonchi Abdomen: soft/nontender/nondistended/normal bowel sounds. No rebound or guarding.  Ext: no edema Skin: warm, dry Neuro: grossly normal, moves all extremities, PERRLA   Assessment and Plan  82 y.o. male presenting for annual physical.  Health Maintenance counseling: 1. Anticipatory guidance: Patient counseled regarding regular dental exams -q4 months, eye exams -yearly,  avoiding smoking and second hand smoke , limiting alcohol to 2 beverages per day - well under, no illicit drugs .   2. Risk factor reduction:  Advised patient of need for regular exercise and diet rich and fruits and vegetables to reduce risk of heart attack and stroke.  Exercise- limited by knees- trying to stay active as best he can- doing knee physical therapy exercises . Wants to get back in gym Diet/weight management-weigh tup 8 lbs in last year, down 11 lbs last year so net still lowre than 2 years ago- especially with pain and dietary limitations this is really a challenge- encouraged him to continue to do his best with mild weight loss.  Wt Readings from Last 3 Encounters:  07/03/22 210 lb (95.3 kg)  06/25/22 212 lb (96.2 kg)  06/08/22 211 lb (95.7 kg)  3. Immunizations/screenings/ancillary studies- up to date  Immunization History  Administered Date(s) Administered   COVID-19, mRNA, vaccine(Comirnaty)12 years and older 01/29/2022   Fluad Quad(high Dose 65+) 11/21/2018, 12/11/2020, 12/18/2021   Influenza Split 01/08/2012, 11/12/2014   Influenza Whole  12/08/2007, 12/26/2008, 12/22/2010   Influenza, High Dose Seasonal PF 11/16/2015, 01/02/2016, 01/01/2017, 01/06/2018, 02/01/2019, 11/24/2019, 01/23/2020, 01/21/2021   Influenza,inj,Quad PF,6+ Mos 11/30/2012   Influenza-Unspecified 01/12/2014, 12/21/2017   PFIZER Comirnaty(Gray Top)Covid-19 Tri-Sucrose Vaccine 07/29/2020, 01/29/2022   PFIZER(Purple Top)SARS-COV-2 Vaccination 04/28/2019, 05/23/2019, 12/21/2019, 01/23/2020   PNEUMOCOCCAL CONJUGATE-20 09/26/2021   Pfizer Covid-19 Vaccine Bivalent Booster 33yrs & up 12/11/2020   Pneumococcal Conjugate-13 05/31/2014   Pneumococcal Polysaccharide-23 06/04/2006, 07/15/2012, 01/02/2016, 12/31/2016, 01/23/2020, 01/21/2021   Respiratory Syncytial Virus Vaccine,Recomb Aduvanted(Arexvy) 02/27/2022   Td 04/23/2005, 05/13/2021   Tdap 02/18/2016   Zoster Recombinat (Shingrix) 01/31/2018, 04/02/2018   Zoster, Live 01/08/2012  4. Prostate cancer screening- history of bladder and prostate cancer followed by urology- they monitor PSA - they also monitor urine  5. Colon cancer screening - 05/07/21 and no further colonoscopy planned 6. Skin cancer screening- Dr. Donzetta Starch- told him spot on forehead is not major concern but to monitor- using a cream that seems to help. advised regular sunscreen use. Denies worrisome, changing, or new skin lesions.  7. Smoking associated screening (lung cancer screening, AAA screen 65-75, UA)- former smoker- quit in 1990s- no regular screening needed 8. STD screening - only active sexually with wife- they are not active thoug  Status of chronic or acute concerns   #  Bilateral knee pain-has been working with Dr. Jean Rosenthal and Dr. Katrinka Blazing- barrier and doesn't want to work out. Using tylenol and some benefit but missed yesterday and pain worse.  -encouraged to restart tylenol- didn't do well with steroid injection- had confusion -topical Voltaren not helpful  # Abdominal pain/loose stools-ongoing stomach issues with loose stools,  abdominal pain-has seen gastroenterology - last seen 01/29/21- negative CT scan around that time and wanted to defer colonoscopy- overall stable since that time (comes and goes). SIBO test negative. 05/09/21 colonoscopy ressuring- precancerous polyps noted. Probiotics didn't really help. Gas x doesn't help much.  -discussed Fermentable, Oligo-saccharides, Di-saccharides, Mono-saccharides, and Polyols eliminations diet- he declines  for now  #CAD/hyperlipidemia-history of CABG x5- follows with Dr. Antoine Poche S: Meds: asa 81mg , praluent -Fatigue on Repatha in 2021-restarted on simvastatin 20 mg (we are not 100% sure if he is on this- he is going to check- but found out at end of visit he is not)  with plan for Zetia 10 mg p.o. as well but later placed back on praluent -Stent drug eluting placed 04/13/19 Dr. Clifton James - no chest pain or shortness of breath  Lab Results  Component Value Date   CHOL 115 06/29/2022   HDL 42 06/29/2022   LDLCALC 46 06/29/2022   LDLDIRECT 115.0 06/04/2016   TRIG 159 (H) 06/29/2022   CHOLHDL 2.7 06/29/2022   A/P: cholesterol looks great- continue current medications - not on simvastatin  CAD- asymptomatic - continue current medications    #Hypertension/CKD stage III S: Compliant with amlodipine 5 mg, hydrochlorothiazide 25 mg and valsartan 320mg  -GFR has been stable-right around 50 on last check.  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 S:  Medication: none essentially right now    06/04/2022    8:41 AM 04/02/2022    9:19 AM 01/01/2022    9:24 AM  Depression screen PHQ 2/9  Decreased Interest 0 1 0  Down, Depressed, Hopeless 1 1 0  PHQ - 2 Score 1 2 0  Altered sleeping 0 0 0  Tired, decreased  energy 0 2 0  Change in appetite 0 0 0  Feeling bad or failure about yourself  0 0 0  Trouble concentrating 0 0 0  Moving slowly or fidgety/restless 0 0 0  Suicidal thoughts 0 0 0  PHQ-9 Score 1 4 0  Difficult doing work/chores Not difficult at all Not difficult at all Not difficult at all  A/P: full remission- continue without medications    #OAB- mybetriq not helpful. Gemtessa reasonably helpful- urinates a lot around 4 30 or 5   % Neuropathy-follows with Dr. Karel Jarvis S: Compliant with gabapentin.  Burning/needle pain in the feet  A/P: reasonably stable- continue current medications    #Hyperglycemia history of mildly elevated A1c.  Prediabetes range. Slightly worsened- work on getting weight back down gradually    Lab Results  Component Value Date   HGBA1C 6.1 03/25/2022   HGBA1C 5.7 05/13/2021   HGBA1C 5.7 02/06/2021   #Senile purpura-easy bruising and bleeding on aspirin.    #Aortic atherosclerosis (presumed stable)- LDL goal ideally <70 - LDL at goal continue current medications   #b12 deficiency-on oral B12.  Doing daily  Lab Results  Component Value Date   VITAMINB12 850 03/25/2022  Recommended follow up: Return in about 3 months (around 10/02/2022) for followup or sooner if needed.Schedule b4 you leave. Future Appointments  Date Time Provider Department Center  07/13/2022 10:45 AM Chrystie Nose, MD [facility] None  07/14/2022  9:30 AM Judi Saa, DO [facility] None  08/31/2022  2:30 PM Erroll [name], [facility] None  04/28/2023  8:30 AM Van Clines, MD [facility] None  06/10/2023 10:00 AM [facility] ANNUAL WELLNESS VISIT 1 [facility]   Lab/Order associations:already had labs fasting   ICD-10-CM   1. Preventative health care  Z00.00     2. Hyperlipidemia, unspecified hyperlipidemia type  E78.5     3. Hyperglycemia  R73.9     4. Screening for diabetes mellitus  Z13.1     5. Neuropathy (HCC)  G62.9     6. Aortic atherosclerosis  I70.0     7.  Atherosclerosis of native coronary artery of native heart without angina pectoris  I25.10     8. Major depressive disorder with single episode, in full remission Chronic F32.5     9. Senile purpura Chronic D69.2      No orders of the defined types were placed in this encounter.   Return precautions advised.  Tana Conch, MD

## 2022-07-06 ENCOUNTER — Ambulatory Visit (INDEPENDENT_AMBULATORY_CARE_PROVIDER_SITE_OTHER): Payer: PPO | Admitting: Internal Medicine

## 2022-07-06 ENCOUNTER — Encounter: Payer: Self-pay | Admitting: Internal Medicine

## 2022-07-06 VITALS — BP 134/70 | HR 73 | Temp 97.3°F | Ht 66.0 in | Wt 213.2 lb

## 2022-07-06 DIAGNOSIS — N481 Balanitis: Secondary | ICD-10-CM | POA: Diagnosis not present

## 2022-07-06 DIAGNOSIS — R300 Dysuria: Secondary | ICD-10-CM

## 2022-07-06 DIAGNOSIS — R32 Unspecified urinary incontinence: Secondary | ICD-10-CM | POA: Diagnosis not present

## 2022-07-06 DIAGNOSIS — N368 Other specified disorders of urethra: Secondary | ICD-10-CM

## 2022-07-06 DIAGNOSIS — R350 Frequency of micturition: Secondary | ICD-10-CM | POA: Insufficient documentation

## 2022-07-06 DIAGNOSIS — N3642 Intrinsic sphincter deficiency (ISD): Secondary | ICD-10-CM

## 2022-07-06 LAB — POCT URINALYSIS DIPSTICK
Bilirubin, UA: NEGATIVE
Blood, UA: NEGATIVE
Glucose, UA: NEGATIVE
Ketones, UA: NEGATIVE
Leukocytes, UA: NEGATIVE
Nitrite, UA: NEGATIVE
Protein, UA: NEGATIVE
Spec Grav, UA: 1.015 (ref 1.010–1.025)
Urobilinogen, UA: 0.2 E.U./dL
pH, UA: 6 (ref 5.0–8.0)

## 2022-07-06 MED ORDER — CEPHALEXIN 500 MG PO CAPS: 500.0000 mg | ORAL_CAPSULE | Freq: Three times a day (TID) | ORAL | 0 refills | Status: AC

## 2022-07-06 MED ORDER — HYDROMORPHONE HCL 2 MG PO TABS: 2.0000 mg | ORAL_TABLET | ORAL | 0 refills | Status: DC | PRN

## 2022-07-06 NOTE — Patient Instructions (Addendum)
It was a pleasure seeing you today!  Your health and satisfaction are my top priorities. If you believe your experience today was worthy of a 5-star rating, I'd be grateful for your feedback! [name], MD   Next Steps: Schedule Follow-Up:  If any of your medical issues become urgent or worsen, please don't hesitate to reach out or seek emergency room care. In the meantime, I strongly encourage scheduling routine follow up appointments prior to leaving or calling [phone] if you don't have one scheduled yet.  We recommend your next follow-up appointment by end of this week and sooner if you are not doing well. Future Appointments  Date Time Provider Department Center  07/13/2022 10:45 AM Chrystie Nose, MD [facility] None  07/13/2022  4:20 PM [name], MD [facility]  07/14/2022  9:30 AM Judi Saa, DO [facility] None  08/31/2022  2:30 PM Erroll [name], [facility] None  10/13/2022  8:20 AM Shelva Majestic, MD [facility]  04/28/2023  8:30 AM Van Clines, MD [facility] None  06/10/2023 10:00 AM [facility] ANNUAL WELLNESS VISIT 1 [facility]     Preventive Care:  Don't forget to schedule your annual preventive care visit!  This important checkup is typically covered by insurance and helps identify potential health issues early.  Typically its 100% insurance covered with no co-pay and helps to get surveillance labwork paid for.  Lab & X-ray Appointments:  Scheduled any incomplete lab tests today or call us to schedule.  XRays can be done without an appointment at [facility] at [facility] ([address], [address]), M-F 8:30am-noon or 1pm-5pm.  Just tell them you're there for X-rays ordered by Dr. Jon Billings.  We'll receive the results and contact you by phone or MyChart to discuss next steps.  Medical Information Release:  If you have any relevant medical information we don't have, please sign a release form so we can obtain it for your records.  Bring to Your Next  Appointment: Medications: Please bring all your medication bottles to your next appointment to ensure we have an accurate record of your prescriptions. Health Diaries: If you're monitoring any health conditions at home, keeping a diary of your readings can be very helpful for discussions at your next appointment.  Please Review your early draft clinical notes below and the final encounter summary tomorrow on MyChart after its been completed.   Urethral erosion  Balanitis Assessment & Plan: I think you have balanitis and the urethral orifice erosion caused by high candidal overgrowth with chronic moisture from chronic urinary incontinence.  Therefore the focus of treatment will be on keeping here urethral orifice dry as possible and also right after drying it after your any urination to cover it back up with antifungal ointments to protect it.  I am also giving an antibiotic because there is enough breakdown of tissue that there could also be a bacterial infection in the area I do think you should go ahead and take it but I think the primary cause of the problem is the chronic moisture.  So you need to develop strategies to [place] and keep chronic moisture away like basically going commando or using fans.  I think you need very as follow-up because this could be severe if it is not improving and can become very very problematic so either see urology or Dr. Durene Cal again before the end of the week  Balanitis is an inflammation of the glans penis12. It can  be caused by various factors, including infections, skin conditions, irritation due to shower gels, soaps, or chemicals present in condoms and lubricants2. Treatment for balanitis depends on its cause12. Here are some common treatments: Antibiotics: Penicillin, Cefadroxil are used for bacterial infections1. Antifungal creams: Clotrimazole, Miconazole are used to treat yeast infection1. Steroid cream: Hydrocortisone is used to manage irritation1. In the  case of balanitis due to urinary incontinence that leads to erosion of the urethral orifice, it's important to manage the underlying urinary incontinence.  Given the patient's history of radical prostatectomy, his urinary incontinence is likely a consequence of surgery. Management strategies should therefore focus on both the incontinence and the prevention of skin complications. Beyond the use of creams and ointments for fungal balanitis, additional measures include: Optimization of Pad Use: Ensure the pads used are of high absorbency and changed frequently to keep the skin dry. Consider pads designed specifically for men, which can offer better protection and fit. Barrier Creams: While the patient is already using creams and ointments, it's crucial to use barrier creams that can protect the skin from moisture, such as those containing zinc oxide, which can be particularly effective in preventing skin irritation and breakdown  . Pelvic Floor Muscle Training (PFMT): Although the patient's primary concern is skin integrity, addressing the underlying incontinence can reduce moisture exposure. PFMT can strengthen the muscles that control urination, potentially reducing leakage  . Scheduled Voiding: Timed voiding or bladder training can help manage incontinence by urinating at set times to prevent accidents and reduce reliance on pads  . Fluid and Diet Management: Adjusting fluid intake to ensure adequate hydration while avoiding excessive intake can help manage incontinence. Limiting bladder irritants such as caffeine and alcohol may also reduce urgency and frequency  . Portable Urinals: For men with mobility issues or those who have difficulty reaching the bathroom in time, a portable urinal can provide a practical solution to manage incontinence, especially at night.  Orders: -     Cephalexin; Take 1 capsule (500 mg total) by mouth 3 (three) times daily for 7 days. Take for 7 days  Dispense: 21  capsule; Refill: 0 -     HYDROmorphone HCl; Take 1 tablet (2 mg total) by mouth every 4 (four) hours as needed for severe pain.  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 Complex Concerns: If your concern is more complex, scheduling an appointment might be best. Discuss this with the staff to find the most suitable option. Lab & Imaging Results: We'll contact you directly if results are abnormal or you don't use MyChart. Most normal results will be on MyChart within 2-3 business days, with a review message from Dr. Jon Billings. Haven't heard back in 2 weeks? Need results sooner? Contact us at [phone]. Referrals: Our referral coordinator will manage specialist referrals. The specialist's office should contact you within 2 weeks to schedule an appointment. Call us if you haven't heard from them after 2 weeks.  Staying Connected:  MyChart: Activate your MyChart for the fastest way to access results and message [place]. See the last page of this paperwork for instructions.  Billing: X-ray & Lab Orders: These are billed by separate companies. Contact the  invoicing company directly for questions or concerns. Visit Charges: Discuss any billing inquiries with our administrative services team.  Feedback & Satisfaction: Share Your Experience: We strive for your satisfaction! If you have any complaints, please let Dr. Jon Billings know directly or contact our Practice Administrators, Edwena Felty or [employer], by asking at the front desk.  Scheduling Tips: Shorter Wait Times: 8 am and 1 pm appointments often have the quickest wait times. Longer Appointments: If you need more time during your visit, talk to  the front desk. Due to insurance regulations, multiple back-to-back appointments might be necessary.

## 2022-07-06 NOTE — Progress Notes (Signed)
[facility]: [phone]   Routine Medical Office Visit  Patient:  Joshua [name]      Age: 82 y.o.       Sex:  male  Date:   07/06/2022 PCP:    Shelva Majestic, MD   Today's Healthcare Provider: [name], MD   Assessment and Plan:      Urethral erosion  Balanitis Assessment & Plan: In my medical opinion, you have balanitis and urethral orifice erosion caused by chronic urinary incontinence->chronic moisture at urethral orifice->candidal fungal growth-> urethral orifice erosion->balanitis.  Therefore the focus of treatment will be on keeping your urethral orifice dry as possible and also right after drying it   cover it back up with antifungal ointments and barrier ointments to protect it.  I am also giving an antibiotic because there is enough breakdown of tissue that there could also be a bacterial infection in the area. I do think you should go ahead and take it but I think the primary cause of the problem is the chronic moisture and fungal.  So you need to develop strategies to keep chronic moisture away like basically going commando or using fans.  I think you need very close follow-up because this could be severe if it is not improving and can become very very problematic so either see urology or Dr. Durene Cal again before the end of the week  Info: Balanitis is an inflammation of the glans penis. It can be caused by various factors, including infections, skin conditions, irritation due to shower gels, soaps, or chemicals present in condoms and lubricants. Treatment for balanitis depends on its cause- yours seems due to chronic moisture. Here are some common treatments: Antibiotics: Penicillin, Cefadroxil are used for bacterial infections1. Antifungal creams: Clotrimazole, Miconazole are used to treat yeast infection1. Steroid cream: Hydrocortisone is used to manage irritation1. In the case of balanitis due to urinary incontinence that leads to  erosion of the urethral orifice, it's important to manage the underlying urinary incontinence.   To enhance the efficiency and accuracy of your care plan, I've utilized a secure artificial intelligence (AI) tool to analyze your recent exam findings and lab results. This AI tool helps identify trends and personalize treatment pathways based on established medical guidelines. Importantly, no personally identifiable information was shared with the AI.  I have thoroughly reviewed the AI-generated analysis and agree with its recommendations.  This analysis is included in your MyChart app for your reference.  If you have any questions or require further explanation of the analysis or plan, please don't hesitate to schedule an appointment for a more in-depth discussion. ------------------------- ARTIFICIAL INTELLIGENCE GENERATED CARE PLAN FOLLOWS------------------------  Given the patient's history of radical prostatectomy, his urinary incontinence is likely a consequence of surgery. Management strategies should therefore focus on both the incontinence and the prevention of skin complications. Beyond the use of creams and ointments for fungal balanitis, additional measures include: Optimization of Pad Use: Ensure the pads used are of high absorbency and changed frequently to keep the skin dry. Consider pads designed specifically for men, which can offer better protection and fit. Barrier Creams: While the patient is already using creams and ointments, it's crucial to use barrier creams that can protect the skin from moisture, such as those containing zinc oxide, which can be particularly effective in preventing skin irritation and breakdown  . Pelvic Floor Muscle Training (PFMT): Although the patient's primary concern is skin integrity, addressing the underlying incontinence can reduce moisture  exposure. PFMT can strengthen the muscles that control urination, potentially reducing leakage  . Scheduled  Voiding: Timed voiding or bladder training can help manage incontinence by urinating at set times to prevent accidents and reduce reliance on pads  . Fluid and Diet Management: Adjusting fluid intake to ensure adequate hydration while avoiding excessive intake can help manage incontinence. Limiting bladder irritants such as caffeine and alcohol may also reduce urgency and frequency  . Portable Urinals: For men with mobility issues or those who have difficulty reaching the bathroom in time, a portable urinal can provide a practical solution to manage incontinence, especially at night.  Orders: -     Cephalexin; Take 1 capsule (500 mg total) by mouth 3 (three) times daily for 7 days. Take for 7 days  Dispense: 21 capsule; Refill: 0 -     HYDROmorphone HCl; Take 1 tablet (2 mg total) by mouth every 4 (four) hours as needed for severe pain.  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 male here today for Dysuria (Pain after urinating-started yesterday.), Urinary Frequency, and Feet pain (From Neuropathy, started today.)  Reviewed:  has a past medical history of Acute medial meniscal tear, Anemia (2015), Arthritis, Asthma, Bladder cancer (04/2010), CAD (coronary artery disease), Diverticulosis, DJD (degenerative joint disease), GERD (gastroesophageal reflux disease), H/O inguinal hernia repair (12/2018), History of gout, Hyperlipidemia, Hypertension, IBS (irritable bowel syndrome), Obesity, OSA (obstructive sleep apnea), Pancreatitis (~ 1980), Peripheral vascular disease, Pre-diabetes, and Prostate cancer (04/2010). 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   Essential  hypertension 02/21/2007   Coronary atherosclerosis 10/12/2006   Asthma 10/12/2006   GERD 10/12/2006   Irritable bowel syndrome 10/12/2006   OSTEOARTHRITIS, LOWER LEG, LEFT 10/04/2009   OSA (obstructive sleep apnea) 11/02/2006   Generalized abdominal pain 01/31/2009   History of bladder cancer 04/01/2010   Chronic bilateral low back pain with left-sided sciatica 10/09/2011   RLS (restless legs syndrome) 01/03/2013   Neuropathy (HCC) 08/21/2013   Dizziness 08/23/2013   CKD (chronic kidney disease), stage III (HCC) 10/23/2013   Fatigue 11/09/2013   Allergic rhinitis 05/31/2014   Hereditary and idiopathic peripheral neuropathy 09/25/2014   Obesity, Class I, BMI 30-34.9 10/03/2015   Gout 11/20/2016   Avulsion fracture of metatarsal bone of right foot 11/25/2016   Venous insufficiency 12/29/2016   Ganglion cyst of tendon sheath of right hand 12/29/2016   Neck pain 02/08/2017   DJD (degenerative joint disease)    Arthritis    Weakness of left leg 06/21/2017   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   Hyperglycemia 10/07/2017   Aortic atherosclerosis 05/05/2018   Abdominal muscle strain, initial encounter 06/16/2018   Greater trochanteric bursitis of left hip 10/27/2018   Degenerative disc disease, lumbar 10/27/2018   Unstable angina    Educated about COVID-19 virus infection 04/20/2019   Degenerative arthritis of left knee 08/08/2019   Left lateral epicondylitis 09/05/2019   Overactive bladder 11/24/2019   Trigger thumb, left thumb 01/24/2020   Coronary artery disease involving coronary bypass graft of native heart without angina pectoris 04/07/2020   Pre-operative respiratory examination 06/05/2020   Primary osteoarthritis of left knee 06/18/2020   Pain  due to total left knee replacement 09/25/2020   Bloating 12/27/2020   Hyperactive bowel sounds 01/29/2021   Greater trochanteric bursitis of right hip 02/17/2021   Degenerative cervical disc 01/12/2022   Left hamstring injury 04/21/2022    Degenerative arthritis of right knee 06/08/2022   Balanitis 07/06/2022   Urinary frequency 07/06/2022   Resolved Ambulatory Problems    Diagnosis Date Noted   THRUSH 05/01/2009   Acute frontal sinusitis 01/06/2010   ASTHMA UNSPECIFIED WITH EXACERBATION 04/22/2009   CONSTIPATION 01/31/2009   LOC OSTEOARTHROS NOT SPEC PRIM/SEC PELV RGN&THI 12/26/2008   OSTEOARTHROSIS NOS, UNSPECIFIED SITE 11/02/2006   HIP PAIN, RIGHT 12/26/2008   MEDIAL EPICONDYLITIS, LEFT 08/25/2007   HEEL PAIN, RIGHT 10/10/2008   SYMPTOM, MEMORY LOSS 12/14/2006   SHORTNESS OF BREATH (SOB) 05/01/2009   CHEST PAIN-UNSPECIFIED 05/01/2009   PULMONARY CONGESTION 04/12/2009   Abdominal pain, epigastric 09/14/2008   MEDIAL MENISCUS TEAR 05/05/2007   TOBACCO ABUSE, HX OF 12/26/2008   Pain in lower limb 11/07/2013   Precordial pain 02/06/2014   Neuralgia 04/10/2014   Porokeratosis 04/10/2014   Anal fissure 12/06/2014   Rectal bleeding 12/06/2014   CN (constipation) 12/06/2014   Musculoskeletal chest pain 04/24/2015   Dyslipidemia 09/23/2016   Overweight 09/23/2016   Prostate cancer 04/23/2010   Pneumonia    Pancreatitis    Obesity    IBS (irritable bowel syndrome)    Hypertension    History of gout    History of blood transfusion 03/24/1999   GERD (gastroesophageal reflux disease)    Diverticulosis    Depression    Coronary artery disease involving native coronary artery of native heart without angina pectoris    Bladder cancer 04/23/2010   Anemia    Acute medial meniscal tear    Exertional angina 04/18/2018   Past Medical History:  Diagnosis Date   Asthma    CAD (coronary artery disease)    H/O inguinal hernia repair 12/2018   Peripheral vascular disease    Pre-diabetes     Outpatient Medications Prior to Visit  Medication Sig   acetaminophen (TYLENOL) 500 MG tablet Take 1,000 mg by mouth every 6 (six) hours as needed for moderate pain or headache.   albuterol (PROAIR HFA) 108 (90 Base) MCG/ACT  inhaler Inhale 1-2 puffs into the lungs every 6 (six) hours as needed for wheezing or shortness of breath.   Alirocumab (PRALUENT) 150 MG/ML SOAJ INJECT 1 DOSE INTO THE SKIN EVERY 14 (FOURTEEN) DAYS.   amLODipine (NORVASC) 5 MG tablet TAKE 1 TABLET (5 MG TOTAL) BY MOUTH DAILY.   ARNUITY ELLIPTA 200 MCG/ACT AEPB Inhale 1 puff into the lungs daily.   aspirin EC 81 MG tablet Take 1 tablet (81 mg total) by mouth 2 (two) times daily after a meal.   cholecalciferol (VITAMIN D3) 25 MCG (1000 UNIT) tablet Take 1,000 Units by mouth daily.   clobetasol cream (TEMOVATE) 0.05 % APPLY TO AFFECTED AREA TWICE A DAY See Dr. Durene Cal soon and discuss about further refills.   Cyanocobalamin (B-12) 2500 MCG TABS Take 1,250 mcg by mouth daily.   diazepam (VALIUM) 5 MG tablet Take 1 tablet (5 mg total) by mouth at bedtime as needed (sleep).   EPINEPHrine 0.3 mg/0.3 mL IJ SOAJ injection Inject 0.3 mg into the muscle as needed for anaphylaxis. for allergic reaction   ezetimibe (ZETIA) 10 MG tablet TAKE 1 TABLET BY MOUTH EVERY DAY   FLOVENT HFA 110 MCG/ACT inhaler Inhale 1 puff into the lungs 2 (two) times daily.  fluticasone (FLONASE) 50 MCG/ACT nasal spray Place 1 spray into both nostrils daily.   folic acid (FOLVITE) 800 MCG tablet Take 800 mcg by mouth daily.    gabapentin (NEURONTIN) 300 MG capsule Take 2 cap in AM, 1 cap at noon, 2caps at bedtime   GEMTESA 75 MG TABS TAKE 1 TABLET BY MOUTH EVERY DAY   Glucos-Chond-Hyal Ac-Ca Fructo (MOVE FREE JOINT HEALTH ADVANCE PO) Take 1 tablet by mouth daily.   hydrochlorothiazide (HYDRODIURIL) 25 MG tablet TAKE 1 TABLET BY MOUTH EVERY DAY   ipratropium (ATROVENT HFA) 17 MCG/ACT inhaler Inhale 2 puffs into the lungs 3 (three) times daily.   ketoconazole (NIZORAL) 2 % cream Apply 1 application topically See admin instructions. Twice a day every other day   Miconazole Nitrate (LOTRIMIN AF DEODORANT POWDER) 2 % AERP Apply 1 spray topically daily as needed (jock itch).    nitroGLYCERIN (NITROSTAT) 0.4 MG SL tablet PLACE 1 TABLET UNDER THE TONGUE EVERY 5 (FIVE) MINUTES AS NEEDED.   pantoprazole (PROTONIX) 40 MG tablet TAKE 1 TABLET BY MOUTH EVERY DAY   polyethylene glycol powder (GLYCOLAX/MIRALAX) powder Take 17 g by mouth daily.   silver sulfADIAZINE (SILVADENE) 1 % cream Apply 1 Application topically daily.   valsartan (DIOVAN) 320 MG tablet TAKE 1 TABLET BY MOUTH EVERY DAY   Wheat Dextrin (BENEFIBER) POWD Take 1 Dose by mouth daily. 1 dose = 2 teaspoons   No facility-administered medications prior to visit.    HPI  Updated and modified:  Problem  Balanitis   82 year old male with urinary incontinence and radical prostatectomy history presents with a urethral orifice erosion and balanitis most likely from fungus due to the urinary incontinence.  In addition to creams and ointments he wants to know what else he can do to keep himself dry as he is already wearing pads  Photographs Taken 07/06/22: erosion and tenderness and skin/mucosal erosion and rash at the urethral orifice.      Urinary Frequency             Clinical Data Analysis:   Physical Exam  BP 134/70 (BP Location: Left Arm, Patient Position: Sitting)   Pulse 73   Temp (!) 97.3 F (36.3 C) (Temporal)   Ht 5\' 6"  (1.676 m)   Wt 213 lb 3.2 oz (96.7 kg)   SpO2 97%   BMI 34.41 kg/m  Wt Readings from Last 10 Encounters:  07/06/22 213 lb 3.2 oz (96.7 kg)  07/03/22 210 lb (95.3 kg)  06/25/22 212 lb (96.2 kg)  06/08/22 211 lb (95.7 kg)  06/04/22 212 lb (96.2 kg)  05/12/22 212 lb (96.2 kg)  04/27/22 212 lb (96.2 kg)  04/21/22 211 lb (95.7 kg)  04/08/22 211 lb 6.4 oz (95.9 kg)  04/02/22 211 lb 6.4 oz (95.9 kg)   Vital signs reviewed.  Nursing notes reviewed. Weight trend reviewed. Abnormalities and Problem-Specific physical exam findings:  see problem overview for the urethral orifice rash/erosion which was the subject of today's visit. He was wearing diapers that appeared dry/clean.   Penile glans retracts into pannus at rest, possibly trapping moisture. General Appearance:  No acute distress appreciable.   Well-groomed, healthy-appearing male.  Well proportioned with no abnormal fat distribution.  Good muscle tone. Skin: Clear and well-hydrated. Pulmonary:  Normal work of breathing at rest, no respiratory distress apparent. SpO2: 97 %  Musculoskeletal: All extremities are intact.  Neurological:  Awake, alert, oriented, and engaged.  No obvious focal neurological deficits or cognitive impairments.  Sensorium seems  unclouded. Gait is smooth and coordinated.  Speech is clear and coherent with logical content. Psychiatric:  Appropriate mood, pleasant and cooperative demeanor, cheerful and engaged during the exam   Additional Results Reviewed:     Results for orders placed or performed in visit on 07/06/22  POCT Urinalysis Dipstick  Result Value Ref Range   Color, UA yellow    Clarity, UA clear    Glucose, UA Negative Negative   Bilirubin, UA Negative    Ketones, UA Negative    Spec Grav, UA 1.015 1.010 - 1.025   Blood, UA Negative    pH, UA 6.0 5.0 - 8.0   Protein, UA Negative Negative   Urobilinogen, UA 0.2 0.2 or 1.0 E.U./dL   Nitrite, UA Negative    Leukocytes, UA Negative Negative   Appearance     Odor      Recent Results (from the past 2160 hour(s))  Lipid panel     Status: Abnormal   Collection Time: 06/29/22  8:18 AM  Result Value Ref Range   Cholesterol, Total 115 100 - 199 mg/dL   Triglycerides 250 (H) 0 - 149 mg/dL   HDL 42 >03 mg/dL   VLDL Cholesterol Cal 27 5 - 40 mg/dL   LDL Chol Calc (NIH) 46 0 - 99 mg/dL   Chol/HDL Ratio 2.7 0.0 - 5.0 ratio    Comment:                                   T. Chol/HDL Ratio                                             Men  Women                               1/2 Avg.Risk  3.4    3.3                                   Avg.Risk  5.0    4.4                                2X Avg.Risk  9.6    7.1                                 3X Avg.Risk 23.4   11.0   POCT Urinalysis Dipstick     Status: Normal   Collection Time: 07/06/22  2:15 PM  Result Value Ref Range   Color, UA yellow    Clarity, UA clear    Glucose, UA Negative Negative   Bilirubin, UA Negative    Ketones, UA Negative    Spec Grav, UA 1.015 1.010 - 1.025   Blood, UA Negative    pH, UA 6.0 5.0 - 8.0   Protein, UA Negative Negative   Urobilinogen, UA 0.2 0.2 or 1.0 E.U./dL   Nitrite, UA Negative    Leukocytes, UA Negative Negative   Appearance     Odor      No image results found.  --------------------------------  Signed: [name], MD 07/06/2022 8:54 PM

## 2022-07-06 NOTE — Assessment & Plan Note (Addendum)
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  Therefore the focus of treatment will be on keeping your urethral orifice dry as possible and also right after drying it   cover it back up with antifungal ointments and barrier ointments to protect it.  I am also giving an antibiotic because there is enough breakdown of tissue that there could also be a bacterial infection in the area. I do think you should go ahead and take it but I think the primary cause of the problem is the chronic moisture and fungal.  So you need to develop strategies to keep chronic moisture away like basically going commando or using fans.  I think you need very close follow-up because this could be severe if it is not improving and can become very very problematic so either see urology or Dr. Durene Cal again before the end of the week  Info: Balanitis is an inflammation of the glans penis. It can be caused by various factors, including infections, skin conditions, irritation due to shower gels, soaps, or chemicals present in condoms and lubricants. Treatment for balanitis depends on its cause- yours seems due to chronic moisture. Here are some common treatments: Antibiotics: Penicillin, Cefadroxil are used for bacterial infections1. Antifungal creams: Clotrimazole, Miconazole are used to treat yeast infection1. Steroid cream: Hydrocortisone is used to manage irritation1. In the case of balanitis due to urinary incontinence that leads to erosion of the urethral orifice, it's important to manage the underlying urinary incontinence.   To enhance the efficiency and accuracy of your care plan, I've utilized a secure artificial intelligence (AI) tool to analyze your recent exam findings and lab results. This AI tool helps identify trends and personalize treatment pathways based  on established medical guidelines. Importantly, no personally identifiable information was shared with the AI.  I have thoroughly reviewed the AI-generated analysis and agree with its recommendations.  This analysis is included in your MyChart app for your reference.  If you have any questions or require further explanation of the analysis or plan, please don't hesitate to schedule an appointment for a more in-depth discussion. ------------------------- ARTIFICIAL INTELLIGENCE GENERATED CARE PLAN FOLLOWS------------------------  Given the patient's history of radical prostatectomy, his urinary incontinence is likely a consequence of surgery. Management strategies should therefore focus on both the incontinence and the prevention of skin complications. Beyond the use of creams and ointments for fungal balanitis, additional measures include: Optimization of Pad Use: Ensure the pads used are of high absorbency and changed frequently to keep the skin dry. Consider pads designed specifically for men, which can offer better protection and fit. Barrier Creams: While the patient is already using creams and ointments, it's crucial to use barrier creams that can protect the skin from moisture, such as those containing zinc oxide, which can be particularly effective in preventing skin irritation and breakdown  . Pelvic Floor Muscle Training (PFMT): Although the patient's primary concern is skin integrity, addressing the underlying incontinence can reduce moisture exposure. PFMT can strengthen the muscles that control urination, potentially reducing leakage  . Scheduled Voiding: Timed voiding or bladder training can help manage incontinence by urinating at set times to prevent accidents and reduce reliance on pads  . Fluid and Diet Management: Adjusting fluid intake to ensure adequate hydration while avoiding excessive intake can help manage incontinence. Limiting bladder irritants such as caffeine and alcohol  may also reduce urgency and  frequency  . Portable Urinals: For men with mobility issues or those who have difficulty reaching the bathroom in time, a portable urinal can provide a practical solution to manage incontinence, especially at night.

## 2022-07-08 ENCOUNTER — Other Ambulatory Visit: Payer: Self-pay | Admitting: Internal Medicine

## 2022-07-08 ENCOUNTER — Telehealth: Payer: Self-pay | Admitting: Family Medicine

## 2022-07-08 MED ORDER — TRAMADOL HCL 50 MG PO TABS: 50.0000 mg | ORAL_TABLET | Freq: Four times a day (QID) | ORAL | 1 refills | Status: DC | PRN

## 2022-07-08 NOTE — Telephone Encounter (Signed)
I sent in tramadol

## 2022-07-08 NOTE — Telephone Encounter (Signed)
Patient states: - Pharmacy does not have Hydromorphone in stock @ that location  - He would prefer to have tramadol sent in instead of having rx order sent to another pharmacy  - Tramadol seems to help his overall pain--Requests whatever dosage provider finds necessary   Please Advise.

## 2022-07-08 NOTE — Telephone Encounter (Signed)
See below

## 2022-07-10 ENCOUNTER — Telehealth: Payer: Self-pay | Admitting: Family Medicine

## 2022-07-10 NOTE — Telephone Encounter (Signed)
Pt would like a call back, he has a question on taking more Taramadol. Either taking a half more. Please advise.

## 2022-07-10 NOTE — Telephone Encounter (Signed)
Patient notified

## 2022-07-10 NOTE — Telephone Encounter (Signed)
Ideally limit to every 6 hours- could take tylenol in between this

## 2022-07-13 ENCOUNTER — Encounter: Payer: Self-pay | Admitting: Internal Medicine

## 2022-07-13 ENCOUNTER — Ambulatory Visit: Payer: PPO | Attending: Internal Medicine | Admitting: Internal Medicine

## 2022-07-13 ENCOUNTER — Ambulatory Visit (INDEPENDENT_AMBULATORY_CARE_PROVIDER_SITE_OTHER): Payer: PPO | Admitting: Internal Medicine

## 2022-07-13 VITALS — BP 122/74 | HR 62 | Ht 66.0 in | Wt 211.6 lb

## 2022-07-13 VITALS — BP 140/78 | HR 71 | Temp 97.2°F | Ht 66.0 in | Wt 211.6 lb

## 2022-07-13 DIAGNOSIS — M791 Myalgia, unspecified site: Secondary | ICD-10-CM | POA: Diagnosis not present

## 2022-07-13 DIAGNOSIS — N481 Balanitis: Secondary | ICD-10-CM | POA: Diagnosis not present

## 2022-07-13 DIAGNOSIS — T466X5A Adverse effect of antihyperlipidemic and antiarteriosclerotic drugs, initial encounter: Secondary | ICD-10-CM

## 2022-07-13 DIAGNOSIS — T466X5D Adverse effect of antihyperlipidemic and antiarteriosclerotic drugs, subsequent encounter: Secondary | ICD-10-CM | POA: Diagnosis not present

## 2022-07-13 DIAGNOSIS — E785 Hyperlipidemia, unspecified: Secondary | ICD-10-CM

## 2022-07-13 DIAGNOSIS — I25810 Atherosclerosis of coronary artery bypass graft(s) without angina pectoris: Secondary | ICD-10-CM

## 2022-07-13 DIAGNOSIS — I10 Essential (primary) hypertension: Secondary | ICD-10-CM | POA: Diagnosis not present

## 2022-07-13 MED ORDER — CLOTRIMAZOLE 1 % EX CREA
1.0000 | TOPICAL_CREAM | Freq: Two times a day (BID) | CUTANEOUS | 1 refills | Status: DC
Start: 2022-07-13 — End: 2022-08-05

## 2022-07-13 NOTE — Progress Notes (Signed)
[facility]: [phone]   Routine Medical Office Visit  Patient:  Joshua [name]      Age: 82 y.o.       Sex:  male  Date:   07/13/2022 PCP:    Shelva Majestic, MD   Today's Healthcare Provider: [name], MD   Assessment and Plan:   Balanitis Assessment & Plan: Advised him to complete the antibiotic and then we do not need any further after that but we need to follow back up in another week or 2 now because of the uncertainty about whether it is going to finish resolving with the current plan  This is a very unusual presentation is improving but it is not not necessarily better because he is got new symptoms around the rim and my impression visually it looks even worse than before.  We discussed this further and he says that he also has had some arthritis pain in his hands and knees in the last few weeks.  This raises the specter of a possible diagnosis of reactive arthritis with circinate balanitis labs not available today and were too late in the day anyway so we will go ahead and order some lab work testing to evaluate that possible diagnosis and go ahead and rule out gonorrhea or chlamydia even though those seem very unlikely but because of the uncertainty I have about the diagnosis I want to rule out any other alternative causes even though I do still prefer as my primary diagnostic consideration fungal balanitis and I encouraged him to continue with the current treatment plan  Has not had any conjunctivitis and no GI bug or respiratory infection in the last month or 2 so I dismissed the concept of reactive arthritis and decided against doing the blood work not had sex and the many years so STD I testing seemed unnecessary as well  I've encouraged him to follow up with Dr. Durene Cal and his urologist but has had difficulty getting in  Orders: -     Clotrimazole; Apply 1 Application topically 2 (two) times daily.  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    Clinical Presentation:   82 y.o. male here today for One week follow-up (Urinary issue has improved.)  HPI   Updated chart data:  Problem  Balanitis     Interim history: 07/13/22 he reports that the dysuria and burning with urination is way down and it still just at the tip of the penis or the urine comes out the urethral orifice.  He feels like he is getting better by 40% or so based on symptom analysis but is still very tender at the tip of the penis there is also some tenderness around the edge of the glans where it meets the shaft Photographs Taken Today:     Prior history: 07/06/22 82 year old male with urinary incontinence and radical prostatectomy history presents with a urethral orifice erosion and balanitis most likely from fungus due to the urinary incontinence.  In addition to creams and ointments he wants to know what else he can do to keep himself dry as he is already wearing pads  Photographs Taken 07/06/22: erosion and tenderness and skin/mucosal erosion and rash at the urethral orifice.         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   Essential hypertension 02/21/2007   Coronary atherosclerosis 10/12/2006   Asthma 10/12/2006   GERD 10/12/2006   Irritable bowel syndrome 10/12/2006   OSTEOARTHRITIS, LOWER LEG, LEFT 10/04/2009   OSA (obstructive sleep apnea) 11/02/2006   Generalized abdominal pain 01/31/2009   History of bladder cancer 04/01/2010   Chronic bilateral low back pain with left-sided sciatica 10/09/2011   RLS (restless legs syndrome) 01/03/2013   Neuropathy (HCC) 08/21/2013   Dizziness 08/23/2013   CKD (chronic kidney disease), stage III  (HCC) 10/23/2013   Fatigue 11/09/2013   Allergic rhinitis 05/31/2014   Hereditary and idiopathic peripheral neuropathy 09/25/2014   Obesity, Class I, BMI 30-34.9 10/03/2015   Gout 11/20/2016   Avulsion fracture of metatarsal bone of right foot 11/25/2016   Venous insufficiency 12/29/2016   Ganglion cyst of tendon sheath of right hand 12/29/2016   Neck pain 02/08/2017   DJD (degenerative joint disease)    Arthritis    Weakness of left leg 06/21/2017   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   Hyperglycemia 10/07/2017   Aortic atherosclerosis 05/05/2018   Abdominal muscle strain, initial encounter 06/16/2018   Greater trochanteric bursitis of left hip 10/27/2018   Degenerative disc disease, lumbar 10/27/2018   Unstable angina    Educated about COVID-19 virus infection 04/20/2019   Degenerative arthritis of left knee 08/08/2019   Left lateral epicondylitis 09/05/2019   Overactive bladder 11/24/2019   Trigger thumb, left thumb 01/24/2020   Coronary artery disease involving coronary bypass graft of native heart without angina pectoris 04/07/2020   Pre-operative respiratory examination 06/05/2020   Primary osteoarthritis of left knee 06/18/2020   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 09/25/2020   Bloating 12/27/2020   Hyperactive bowel sounds 01/29/2021   Greater trochanteric bursitis of right hip 02/17/2021   Degenerative cervical disc 01/12/2022   Left hamstring injury 04/21/2022   Degenerative arthritis of right knee 06/08/2022   Balanitis 07/06/2022   Urinary frequency 07/06/2022   Resolved Ambulatory Problems    Diagnosis Date Noted   THRUSH 05/01/2009   Acute frontal sinusitis 01/06/2010   ASTHMA UNSPECIFIED WITH EXACERBATION 04/22/2009   CONSTIPATION 01/31/2009   LOC OSTEOARTHROS NOT SPEC PRIM/SEC PELV RGN&THI 12/26/2008   OSTEOARTHROSIS NOS, UNSPECIFIED SITE 11/02/2006   HIP PAIN, RIGHT 12/26/2008   MEDIAL EPICONDYLITIS, LEFT 08/25/2007   HEEL PAIN, RIGHT 10/10/2008   SYMPTOM,  MEMORY LOSS 12/14/2006   SHORTNESS OF BREATH (SOB) 05/01/2009   CHEST PAIN-UNSPECIFIED 05/01/2009   PULMONARY CONGESTION 04/12/2009   Abdominal pain, epigastric 09/14/2008   MEDIAL MENISCUS TEAR 05/05/2007   TOBACCO ABUSE, HX OF 12/26/2008   Pain in lower limb 11/07/2013   Precordial pain 02/06/2014   Neuralgia 04/10/2014   Porokeratosis 04/10/2014   Anal fissure 12/06/2014   Rectal bleeding 12/06/2014   CN (constipation) 12/06/2014   Musculoskeletal chest pain 04/24/2015   Dyslipidemia 09/23/2016   Overweight 09/23/2016   Prostate cancer 04/23/2010   Pneumonia    Pancreatitis    Obesity    IBS (irritable bowel syndrome)    Hypertension    History of gout    History of blood transfusion 03/24/1999   GERD (gastroesophageal reflux disease)    Diverticulosis    Depression    Coronary artery disease involving native coronary artery of native heart without angina pectoris    Bladder cancer 04/23/2010   Anemia    Acute medial meniscal  tear    Exertional angina 04/18/2018   Past Medical History:  Diagnosis Date   Asthma    CAD (coronary artery disease)    H/O inguinal hernia repair 12/2018   Peripheral vascular disease    Pre-diabetes     Outpatient Medications Prior to Visit  Medication Sig   acetaminophen (TYLENOL) 500 MG tablet Take 1,000 mg by mouth every 6 (six) hours as needed for moderate pain or headache.   albuterol (PROAIR HFA) 108 (90 Base) MCG/ACT inhaler Inhale 1-2 puffs into the lungs every 6 (six) hours as needed for wheezing or shortness of breath.   Alirocumab (PRALUENT) 150 MG/ML SOAJ INJECT 1 DOSE INTO THE SKIN EVERY 14 (FOURTEEN) DAYS.   amLODipine (NORVASC) 5 MG tablet TAKE 1 TABLET (5 MG TOTAL) BY MOUTH DAILY.   ARNUITY ELLIPTA 200 MCG/ACT AEPB Inhale 1 puff into the lungs daily.   aspirin EC 81 MG tablet Take 1 tablet (81 mg total) by mouth 2 (two) times daily after a meal.   cephALEXin (KEFLEX) 500 MG capsule Take 1 capsule (500 mg total) by mouth 3  (three) times daily for 7 days. Take for 7 days   cholecalciferol (VITAMIN D3) 25 MCG (1000 UNIT) tablet Take 1,000 Units by mouth daily.   clobetasol cream (TEMOVATE) 0.05 % APPLY TO AFFECTED AREA TWICE A DAY See Dr. Durene Cal soon and discuss about further refills.   Cyanocobalamin (B-12) 2500 MCG TABS Take 1,250 mcg by mouth daily.   diazepam (VALIUM) 5 MG tablet Take 1 tablet (5 mg total) by mouth at bedtime as needed (sleep).   EPINEPHrine 0.3 mg/0.3 mL IJ SOAJ injection Inject 0.3 mg into the muscle as needed for anaphylaxis. for allergic reaction   ezetimibe (ZETIA) 10 MG tablet TAKE 1 TABLET BY MOUTH EVERY DAY   FLOVENT HFA 110 MCG/ACT inhaler Inhale 1 puff into the lungs 2 (two) times daily.    fluticasone (FLONASE) 50 MCG/ACT nasal spray Place 1 spray into both nostrils daily.   folic acid (FOLVITE) 800 MCG tablet Take 800 mcg by mouth daily.    gabapentin (NEURONTIN) 300 MG capsule Take 2 cap in AM, 1 cap at noon, 2caps at bedtime   GEMTESA 75 MG TABS TAKE 1 TABLET BY MOUTH EVERY DAY   Glucos-Chond-Hyal Ac-Ca Fructo (MOVE FREE JOINT HEALTH ADVANCE PO) Take 1 tablet by mouth daily.   hydrochlorothiazide (HYDRODIURIL) 25 MG tablet TAKE 1 TABLET BY MOUTH EVERY DAY   HYDROmorphone (DILAUDID) 2 MG tablet Take 1 tablet (2 mg total) by mouth every 4 (four) hours as needed for severe pain.   ipratropium (ATROVENT HFA) 17 MCG/ACT inhaler Inhale 2 puffs into the lungs 3 (three) times daily.   ketoconazole (NIZORAL) 2 % cream Apply 1 application topically See admin instructions. Twice a day every other day   Miconazole Nitrate (LOTRIMIN AF DEODORANT POWDER) 2 % AERP Apply 1 spray topically daily as needed (jock itch).   nitroGLYCERIN (NITROSTAT) 0.4 MG SL tablet PLACE 1 TABLET UNDER THE TONGUE EVERY 5 (FIVE) MINUTES AS NEEDED.   pantoprazole (PROTONIX) 40 MG tablet TAKE 1 TABLET BY MOUTH EVERY DAY   polyethylene glycol powder (GLYCOLAX/MIRALAX) powder Take 17 g by mouth daily.   silver sulfADIAZINE  (SILVADENE) 1 % cream Apply 1 Application topically daily.   traMADol (ULTRAM) 50 MG tablet Take 1 tablet (50 mg total) by mouth every 6 (six) hours as needed (no driving 6 hours after taking and do not take within 6 hours of diazepam).  valsartan (DIOVAN) 320 MG tablet TAKE 1 TABLET BY MOUTH EVERY DAY   Wheat Dextrin (BENEFIBER) POWD Take 1 Dose by mouth daily. 1 dose = 2 teaspoons   No facility-administered medications prior to visit.             Clinical Data Analysis:   Physical Exam  BP (!) 140/78 (BP Location: Left Arm, Patient Position: Sitting)   Pulse 71   Temp (!) 97.2 F (36.2 C) (Temporal)   Ht 5\' 6"  (1.676 m)   Wt 211 lb 9.6 oz (96 kg)   SpO2 96%   BMI 34.15 kg/m  Wt Readings from Last 10 Encounters:  07/13/22 211 lb 9.6 oz (96 kg)  07/13/22 211 lb 9.6 oz (96 kg)  07/06/22 213 lb 3.2 oz (96.7 kg)  07/03/22 210 lb (95.3 kg)  06/25/22 212 lb (96.2 kg)  06/08/22 211 lb (95.7 kg)  06/04/22 212 lb (96.2 kg)  05/12/22 212 lb (96.2 kg)  04/27/22 212 lb (96.2 kg)  04/21/22 211 lb (95.7 kg)   Vital signs reviewed.  Nursing notes reviewed. Weight trend reviewed. Abnormalities and Problem-Specific physical exam findings:  balanitis as shown in photos, tenderness over urethral orifice and maybe a little on connection of left sided glans to shaft. General Appearance:  No acute distress appreciable.   Well-groomed, healthy-appearing male.  Well proportioned with no abnormal fat distribution.  Good muscle tone. Skin: Clear and well-hydrated. Pulmonary:  Normal work of breathing at rest, no respiratory distress apparent. SpO2: 96 %  Musculoskeletal: All extremities are intact.  Neurological:  Awake, alert, oriented, and engaged.  No obvious focal neurological deficits or cognitive impairments.  Sensorium seems unclouded. Gait is smooth and coordinated.  Speech is clear and coherent with logical content. Psychiatric:  Appropriate mood, pleasant and cooperative demeanor,  cheerful and engaged during the exam   Additional Results Reviewed:     No results found for any visits on 07/13/22.  Recent Results (from the past 2160 hour(s))  Lipid panel     Status: Abnormal   Collection Time: 06/29/22  8:18 AM  Result Value Ref Range   Cholesterol, Total 115 100 - 199 mg/dL   Triglycerides 045 (H) 0 - 149 mg/dL   HDL 42 >40 mg/dL   VLDL Cholesterol Cal 27 5 - 40 mg/dL   LDL Chol Calc (NIH) 46 0 - 99 mg/dL   Chol/HDL Ratio 2.7 0.0 - 5.0 ratio    Comment:                                   T. Chol/HDL Ratio                                             Men  Women                               1/2 Avg.Risk  3.4    3.3                                   Avg.Risk  5.0    4.4  2X Avg.Risk  9.6    7.1                                3X Avg.Risk 23.4   11.0   POCT Urinalysis Dipstick     Status: Normal   Collection Time: 07/06/22  2:15 PM  Result Value Ref Range   Color, UA yellow    Clarity, UA clear    Glucose, UA Negative Negative   Bilirubin, UA Negative    Ketones, UA Negative    Spec Grav, UA 1.015 1.010 - 1.025   Blood, UA Negative    pH, UA 6.0 5.0 - 8.0   Protein, UA Negative Negative   Urobilinogen, UA 0.2 0.2 or 1.0 E.U./dL   Nitrite, UA Negative    Leukocytes, UA Negative Negative   Appearance     Odor      No image results found.   DG Knee AP/LAT W/Sunrise Right  Result Date: 06/28/2022 CLINICAL DATA:  right knee pain EXAM: RIGHT KNEE 3 VIEWS COMPARISON:  03/20/2020. FINDINGS: No evidence of fracture, dislocation, or joint effusion. There is tricompartmental joint space narrowing, sclerosis and osteophytes consistent with degenerative joint disease. No evidence of effusion. Calcification of menisci consistent with chondrocalcinosis. IMPRESSION: Degenerative changes. Chondrocalcinosis. No acute osseous abnormalities. Electronically Signed   By: Layla Maw M.D.   On: 06/28/2022 15:23      --------------------------------    Signed: [name], MD 07/13/2022 8:41 PM

## 2022-07-13 NOTE — Progress Notes (Signed)
LIPID CLINIC NOTE  Chief Complaint:  Follow-up  Primary Care Physician: HuntShelva Majestic MD  Primary Cardiologist:  Rollene Rotunda, MD  HPI:  Joshua [name] is a 82 y.o. male with a history of 5 vessel bypass more than 20 years ago and recent symptoms of unstable angina with PCI to the origin to the proximal graft lesion of the circumflex in January 2021.  He also has a history of dyslipidemia, hypertension, obstructive sleep apnea, prostate cancer and bladder cancer in the past.  He has been intolerant to numerous statins and had been maintained on Repatha with significant reduction in his lipids with LDL particularly into the 16X in the past however noted over the 2 years that he was taking the medication that he had side effects which initially lasted for 24 to 48 hours then up to 72 to 96 hours after each injection.  He said it was getting to the point where he felt it was intolerable and then was advised to discontinue the medicine.  Currently on simvastatin 20 mg daily which she seems to be able to tolerate, however her lipid profile 2 months ago showed total cholesterol 215, triglycerides 240, HDL 38 and LDL 134.  He remains still well above target LDL less than 70.  This visit today discussed options to see if we can get him to target.  04/24/2020   Mr. Raudenbush returns today for follow-up.  He is actually doing very well with recent changes in his lipid management.  He is tolerating addition of ezetimibe.  Total cholesterol now is 155, HDL 41, LDL 84 (decreased from 134) and triglycerides 176.  His target LDL is less than 70 and he is nearing that.  We discussed other possible options.  He says he is looking forward to becoming more active in the near term after having some work possibly done on his orthopedic issues.  Hopefully this will help improve his numbers further.  Since he cannot tolerate the antibody PCSK9 inhibitors, we also discussed the possibility of inclisiran.  This is  now FDA approved but has not yet made it into patients in our area.  Cost may be an issue however we are still enrolling patients in the Orion-4 trial and he may be a candidate for that.  11/01/2020  Mr. Shiffler continues to do well.  His lipids have been fairly stable if not slightly improved.  Total cholesterol now 161, HDL 50, triglycerides 147 and LDL 85.  He is on simvastatin and ezetimibe.  He cannot tolerate other statins and had an antibody reaction to the PCSK9 inhibitors causing immune/flulike symptoms.  I suspect he will not likely tolerate Praluent for similar mechanism.  Inclisiran would likely be too costly at this point and were having difficulty getting into patients.  His options are otherwise limited for further reduction to target LDL less than 70.  He has been quite stable.  He continues to have problems with his knee which limits his activity.  12/20/2020  Mr. Morrone is seen today for follow-up.  He had some concerns about his blood pressure, specifically he has stopped his simvastatin.  I told him that this was not a blood pressure medication in fact his blood pressure is excellent today 128/72.  The simvastatin was stopped due to side effects including myalgias which did go away significantly after discontinuing it.  He remains on low-dose ezetimibe.  Cholesterol will undoubtedly increase.  He unfortunate did not qualify for Orion-4 due to the  fact that he had not had a prior cardiovascular event although he has had bypass.  I did discuss with him the fact that he could potentially get commercially available inclisiran, now marketed as Leqvio.  He does have Medicare however health team advantage plan and does not have supplemental coverage and cost therefore may be untenable for him.  05/26/2021  Mr. Avey was seen today in follow-up.  He is done very well on he went with marked reduction in his lipids.  Total cholesterol 129, triglycerides 149, HDL 52 and LDL 48.  He is tolerating  this without any issues.  He was able to get a grant and therefore the medicine is covered.  07/13/2022  Mr. Sherburn today in follow-up.  His lipids continue to be well-controlled.  Total cholesterol 115, triglycerides 154, HDL 42 and LDL 46.  He is on combination lower dose Praluent 75 mg daily and ezetimibe.  Seems to be doing well without any chest pain issues.  His main concerns are with bilateral knee pain.  PMHx:  Past Medical History:  Diagnosis Date   Acute medial meniscal tear    Anemia 2015   Arthritis    "middle finger right hand; right knee; neck" (02/06/2014)   Asthma    seasonal   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   "cauterized during prostate OR"   CAD (coronary artery disease)    CABG 2001   Diverticulosis    DJD (degenerative joint disease)    BACK   GERD (gastroesophageal reflux disease)    H/O inguinal hernia repair 12/2018   History of gout    Hyperlipidemia    Hypertension    IBS (irritable bowel syndrome)    Obesity    OSA (obstructive sleep apnea)    USES CPAP    Pancreatitis ~ 1980   Peripheral vascular disease    Pre-diabetes    Prostate cancer 04/2010    Past Surgical History:  Procedure Laterality Date   ANTERIOR CERVICAL DECOMP/DISCECTOMY FUSION  05/26/2006   PLATE PLACED   BACK SURGERY     CARDIAC CATHETERIZATION  11/20/1999   CORONARY ARTERY BYPASS GRAFT  11/20/1999   SVG-RI1-RI2, SVG-OM, SVG-dRCA   CORONARY STENT INTERVENTION N/A 04/13/2019   Procedure: CORONARY STENT INTERVENTION;  Surgeon: Kathleene Hazel, MD;  Location: [facility];  Service: Cardiovascular;  Laterality: N/A;   HERNIA REPAIR     INGUINAL HERNIA REPAIR Left 01/02/2019   Procedure: OPEN LEFT INGUINAL HERNIA REPAIR WITH MESH;  Surgeon: Luretha Murphy, MD;  Location: [facility];  Service: General;  Laterality: Left;   KNEE ARTHROSCOPY Right 1992; 04/2008   LAPAROSCOPIC CHOLECYSTECTOMY  2007   LEFT HEART CATH AND CORS/GRAFTS ANGIOGRAPHY N/A 04/13/2019   Procedure: LEFT  HEART CATH AND CORS/GRAFTS ANGIOGRAPHY;  Surgeon: Kathleene Hazel, MD;  Location: [facility];  Service: Cardiovascular;  Laterality: N/A;   NASAL SEPTUM SURGERY Right 2007   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   ROBOT ASSISTED LAPAROSCOPIC RADICAL PROSTATECTOMY  04/2010   THYROIDECTOMY, PARTIAL  1980   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   Procedure: LEFT TOTAL KNEE ARTHROPLASTY;  Surgeon: Marcene Corning, MD;  Location: [facility];  Service: Orthopedics;  Laterality: Left;    FAMHx:  Family History  Problem Relation Age of Onset   Heart disease Mother    Hyperlipidemia Mother    Heart disease Father    Hyperlipidemia Father    Diabetes Brother    Colon cancer Neg Hx    Pancreatic  cancer Neg Hx    Esophageal cancer Neg Hx    Stomach cancer Neg Hx    Liver cancer Neg Hx     SOCHx:   reports that he quit smoking about 30 years ago. His smoking use included cigarettes. He has a 35.00 pack-year smoking history. He has never used smokeless tobacco. He reports current alcohol use of about 2.0 standard drinks of alcohol per week. He reports that he does not use drugs.  ALLERGIES:  Allergies  Allergen Reactions   Benadryl [Diphenhydramine] Other (See Comments)    Pt told not to take because of bypass surgery   Bromfed Nausea And Vomiting   Clarithromycin Other (See Comments)     gastritis   Codeine Other (See Comments)    Trouble breathing   Cymbalta [Duloxetine Hcl]     Abdominal pain and later chest pain as well as worsening fatigue    Doxycycline Hyclate Nausea And Vomiting   Lexapro [Escitalopram]     Dizziness   Modafinil Other (See Comments)     anxiety-nervousness   Oxycodone-Acetaminophen Itching   Promethazine Hcl Other (See Comments)     fainting   Quinolones Nausea Only    Cipro "felt real bad"   Repatha [Evolocumab]     Weak, worn out   Telithromycin Nausea And Vomiting   Tramadol Nausea Only   Hydrocodone-Acetaminophen Rash   Statins Other (See  Comments)    Leg cramps and makes patient feel bad. Per pt tried multiple in years past   Sulfonamide Derivatives Rash    ROS: Pertinent items noted in HPI and remainder of comprehensive ROS otherwise negative.  HOME MEDS: Current Outpatient Medications on File Prior to Visit  Medication Sig Dispense Refill   acetaminophen (TYLENOL) 500 MG tablet Take 1,000 mg by mouth every 6 (six) hours as needed for moderate pain or headache.     albuterol (PROAIR HFA) 108 (90 Base) MCG/ACT inhaler Inhale 1-2 puffs into the lungs every 6 (six) hours as needed for wheezing or shortness of breath. 18 g 5   Alirocumab (PRALUENT) 150 MG/ML SOAJ INJECT 1 DOSE INTO THE SKIN EVERY 14 (FOURTEEN) DAYS. 6 mL 3   amLODipine (NORVASC) 5 MG tablet TAKE 1 TABLET (5 MG TOTAL) BY MOUTH DAILY. 90 tablet 3   ARNUITY ELLIPTA 200 MCG/ACT AEPB Inhale 1 puff into the lungs daily.     aspirin EC 81 MG tablet Take 1 tablet (81 mg total) by mouth 2 (two) times daily after a meal. 30 tablet 11   cephALEXin (KEFLEX) 500 MG capsule Take 1 capsule (500 mg total) by mouth 3 (three) times daily for 7 days. Take for 7 days 21 capsule 0   cholecalciferol (VITAMIN D3) 25 MCG (1000 UNIT) tablet Take 1,000 Units by mouth daily.     clobetasol cream (TEMOVATE) 0.05 % APPLY TO AFFECTED AREA TWICE A DAY See Dr. Durene Cal soon and discuss about further refills. 30 g 0   Cyanocobalamin (B-12) 2500 MCG TABS Take 1,250 mcg by mouth daily.     diazepam (VALIUM) 5 MG tablet Take 1 tablet (5 mg total) by mouth at bedtime as needed (sleep). 30 tablet 1   EPINEPHrine 0.3 mg/0.3 mL IJ SOAJ injection Inject 0.3 mg into the muscle as needed for anaphylaxis. for allergic reaction     ezetimibe (ZETIA) 10 MG tablet TAKE 1 TABLET BY MOUTH EVERY DAY 90 tablet 2   FLOVENT HFA 110 MCG/ACT inhaler Inhale 1 puff into the lungs 2 (two)  times daily.   5   fluticasone (FLONASE) 50 MCG/ACT nasal spray Place 1 spray into both nostrils daily.     folic acid (FOLVITE) 800  MCG tablet Take 800 mcg by mouth daily.      gabapentin (NEURONTIN) 300 MG capsule Take 2 cap in AM, 1 cap at noon, 2caps at bedtime 450 capsule 3   GEMTESA 75 MG TABS TAKE 1 TABLET BY MOUTH EVERY DAY 90 tablet 3   Glucos-Chond-Hyal Ac-Ca Fructo (MOVE FREE JOINT HEALTH ADVANCE PO) Take 1 tablet by mouth daily.     hydrochlorothiazide (HYDRODIURIL) 25 MG tablet TAKE 1 TABLET BY MOUTH EVERY DAY 90 tablet 2   HYDROmorphone (DILAUDID) 2 MG tablet Take 1 tablet (2 mg total) by mouth every 4 (four) hours as needed for severe pain. 12 tablet 0   ipratropium (ATROVENT HFA) 17 MCG/ACT inhaler Inhale 2 puffs into the lungs 3 (three) times daily. 1 each 1   ketoconazole (NIZORAL) 2 % cream Apply 1 application topically See admin instructions. Twice a day every other day     Miconazole Nitrate (LOTRIMIN AF DEODORANT POWDER) 2 % AERP Apply 1 spray topically daily as needed (jock itch).     nitroGLYCERIN (NITROSTAT) 0.4 MG SL tablet PLACE 1 TABLET UNDER THE TONGUE EVERY 5 (FIVE) MINUTES AS NEEDED. 75 tablet 1   pantoprazole (PROTONIX) 40 MG tablet TAKE 1 TABLET BY MOUTH EVERY DAY 90 tablet 3   polyethylene glycol powder (GLYCOLAX/MIRALAX) powder Take 17 g by mouth daily. 255 g 0   silver sulfADIAZINE (SILVADENE) 1 % cream Apply 1 Application topically daily.     traMADol (ULTRAM) 50 MG tablet Take 1 tablet (50 mg total) by mouth every 6 (six) hours as needed (no driving 6 hours after taking and do not take within 6 hours of diazepam). 20 tablet 1   valsartan (DIOVAN) 320 MG tablet TAKE 1 TABLET BY MOUTH EVERY DAY 90 tablet 3   Wheat Dextrin (BENEFIBER) POWD Take 1 Dose by mouth daily. 1 dose = 2 teaspoons     No current facility-administered medications on file prior to visit.    LABS/IMAGING: No results found. However, due to the size of the patient record, not all encounters were searched. Please check Results Review for a complete set of results. No results found.  LIPID PANEL:    Component Value  Date/Time   CHOL 115 06/29/2022 0818   TRIG 159 (H) 06/29/2022 0818   HDL 42 06/29/2022 0818   CHOLHDL 2.7 06/29/2022 0818   CHOLHDL 3 05/13/2021 1101   VLDL 29.8 05/13/2021 1101   LDLCALC 46 06/29/2022 0818   LDLCALC 84 02/23/2020 0933   LDLDIRECT 115.0 06/04/2016 0919    WEIGHTS: Wt Readings from Last 3 Encounters:  07/13/22 211 lb 9.6 oz (96 kg)  07/06/22 213 lb 3.2 oz (96.7 kg)  07/03/22 210 lb (95.3 kg)    VITALS: BP 122/74 (BP Location: Right Arm, Patient Position: Sitting, Cuff Size: Normal)   Pulse 62   Ht  (1.676 m)   Wt 211 lb 9.6 oz (96 kg)   SpO2 96%   BMI 34.15 kg/m   EXAM: Deferred  EKG: Deferred  ASSESSMENT: Mixed dyslipidemia, goal LDL less than 70 Coronary artery disease status post 5 vessel CABG (2001) Recent unstable angina and PCI January 2021 Hypertension Obstructive sleep apnea Statin intolerance-myalgias Repatha intolerance  PLAN: 1.   Mr. Arndt continues to have very good control over his lipids with LDL at 46.  He has had no further cardiac events since 2021 and had prior CABG and 2001.  Will continue his current therapy.  Follow-up annually or sooner as necessary.  Chrystie Nose, MD, [facility], FACP  [place]  [facility]  Medical Director of the [facility]  Cardiovascular Risk Reduction [facility] Diplomate of the American Board of Clinical Lipidology Attending Cardiologist  Direct Dial: [phone]  Fax: [phone]  Website:  [email or web address] 07/13/2022, 11:01 AM

## 2022-07-13 NOTE — Progress Notes (Unsigned)
[name] [address] [place] [address] Phone: [phone] Subjective:   Joshua [name], am serving as a scribe for Dr. Antoine Primas.  I'm seeing this patient by the request  of:  Shelva Majestic, MD  CC: Right knee pain  [id number]  06/08/2022 Given injection and tolerated the procedure well, has had difficulty with steroids previously so did less of a dose in him.  Alternatives and discussed that patientWe discussed had difficulty with this steroid before but he needed some relief and wanted to do it.  Understood the risks and benefits. we discussed with patient about viscosupplementation and we will see if we can get approval.  No significant instability noted of the knee but patient likely did favor the knee over the course of time with patient's replacement.  Follow-up with me again in 6 to 8 weeks otherwise      Update 07/14/2022 Joshua [name] is a 82 y.o. male coming in with complaint of R knee pain. Patient states that he continues to have pain in R knee with walking. Unable to walk for exercise.      Past Medical History:  Diagnosis Date   Acute medial meniscal tear    Anemia 2015   Arthritis    "middle finger right hand; right knee; neck" (02/06/2014)   Asthma    seasonal   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   "cauterized during prostate OR"   CAD (coronary artery disease)    CABG 2001   Diverticulosis    DJD (degenerative joint disease)    BACK   GERD (gastroesophageal reflux disease)    H/O inguinal hernia repair 12/2018   History of gout    Hyperlipidemia    Hypertension    IBS (irritable bowel syndrome)    Obesity    OSA (obstructive sleep apnea)    USES CPAP    Pancreatitis ~ 1980   Peripheral vascular disease    Pre-diabetes    Prostate cancer 04/2010   Past Surgical History:  Procedure Laterality Date   ANTERIOR CERVICAL DECOMP/DISCECTOMY FUSION  05/26/2006   PLATE PLACED   BACK SURGERY     CARDIAC  CATHETERIZATION  11/20/1999   CORONARY ARTERY BYPASS GRAFT  11/20/1999   SVG-RI1-RI2, SVG-OM, SVG-dRCA   CORONARY STENT INTERVENTION N/A 04/13/2019   Procedure: CORONARY STENT INTERVENTION;  Surgeon: Kathleene Hazel, MD;  Location: [facility];  Service: Cardiovascular;  Laterality: N/A;   HERNIA REPAIR     INGUINAL HERNIA REPAIR Left 01/02/2019   Procedure: OPEN LEFT INGUINAL HERNIA REPAIR WITH MESH;  Surgeon: Luretha Murphy, MD;  Location: [facility];  Service: General;  Laterality: Left;   KNEE ARTHROSCOPY Right 1992; 04/2008   LAPAROSCOPIC CHOLECYSTECTOMY  2007   LEFT HEART CATH AND CORS/GRAFTS ANGIOGRAPHY N/A 04/13/2019   Procedure: LEFT HEART CATH AND CORS/GRAFTS ANGIOGRAPHY;  Surgeon: Kathleene Hazel, MD;  Location: [facility];  Service: Cardiovascular;  Laterality: N/A;   NASAL SEPTUM SURGERY Right 2007   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   ROBOT ASSISTED LAPAROSCOPIC RADICAL PROSTATECTOMY  04/2010   THYROIDECTOMY, PARTIAL  1980   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   Procedure: LEFT TOTAL KNEE ARTHROPLASTY;  Surgeon: Marcene Corning, MD;  Location: [facility];  Service: Orthopedics;  Laterality: Left;   Social History   Socioeconomic History   Marital status: Married    Spouse name: Not on file   Number of children: Not on file   Years  of education: Not on file   Highest education level: Associate degree: occupational, Scientist, product/process development, or vocational program  Occupational History   Occupation: retired    Associate Professor: RETIRED  Tobacco Use   Smoking status: Former    Packs/day: 1.00    Years: 35.00    Additional pack years: 0.00    Total pack years: 35.00    Types: Cigarettes    Quit date: 06/16/1992    Years since quitting: 30.0   Smokeless tobacco: Never  Vaping Use   Vaping Use: Never used  Substance and Sexual Activity   Alcohol use: Yes    Alcohol/week: 2.0 standard drinks of alcohol    Types: 2 Shots of liquor per week    Comment: occ   Drug  use: No   Sexual activity: Not Currently    Partners: Female  Other Topics Concern   Not on file  Social History Narrative   Married 42 years in 2015. 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      Hobbies: poker, movies and tv, staying active      Right handed   2 level home with steps he uses   Social Determinants of Corporate investment banker Strain: Low Risk  (07/10/2022)   Overall Financial Resource Strain (CARDIA)    Difficulty of Paying Living Expenses: Not hard at all  Food Insecurity: No Food Insecurity (07/10/2022)   Hunger Vital Sign    Worried About Running Out of Food in the Last Year: Never true    Ran Out of Food in the Last Year: Never true  Transportation Needs: No Transportation Needs (07/10/2022)   PRAPARE - Administrator, Civil Service (Medical): No    Lack of Transportation (Non-Medical): No  Physical Activity: Insufficiently Active (07/10/2022)   Exercise Vital Sign    Days of Exercise per Week: 1 day    Minutes of Exercise per Session: 10 min  Stress: No Stress Concern Present (07/10/2022)   [employer] of Occupational Health - Occupational Stress Questionnaire    Feeling of Stress : Only a little  Social Connections: Moderately Isolated (07/10/2022)   Social Connection and Isolation Panel [NHANES]    Frequency of Communication with Friends and Family: More than three times a week    Frequency of Social Gatherings with Friends and Family: Three times a week    Attends Religious Services: Never    Active Member of Clubs or Organizations: No    Attends Banker Meetings: Never    Marital Status: Married   Allergies  Allergen Reactions   Benadryl [Diphenhydramine] Other (See Comments)    Pt told not to take because of bypass surgery   Bromfed Nausea And Vomiting   Clarithromycin Other (See Comments)     gastritis   Codeine Other (See Comments)    Trouble  breathing   Cymbalta [Duloxetine Hcl]     Abdominal pain and later chest pain as well as worsening fatigue    Doxycycline Hyclate Nausea And Vomiting   Lexapro [Escitalopram]     Dizziness   Modafinil Other (See Comments)     anxiety-nervousness   Oxycodone-Acetaminophen Itching   Promethazine Hcl Other (See Comments)     fainting   Quinolones Nausea Only    Cipro "felt real bad"   Repatha [Evolocumab]     Weak, worn out   Telithromycin  Nausea And Vomiting   Tramadol Nausea Only   Hydrocodone-Acetaminophen Rash   Statins Other (See Comments)    Leg cramps and makes patient feel bad. Per pt tried multiple in years past   Sulfonamide Derivatives Rash   Family History  Problem Relation Age of Onset   Heart disease Mother    Hyperlipidemia Mother    Heart disease Father    Hyperlipidemia Father    Diabetes Brother    Colon cancer Neg Hx    Pancreatic cancer Neg Hx    Esophageal cancer Neg Hx    Stomach cancer Neg Hx    Liver cancer Neg Hx      Current Outpatient Medications (Cardiovascular):    Alirocumab (PRALUENT) 150 MG/ML SOAJ, INJECT 1 DOSE INTO THE SKIN EVERY 14 (FOURTEEN) DAYS.   amLODipine (NORVASC) 5 MG tablet, TAKE 1 TABLET (5 MG TOTAL) BY MOUTH DAILY.   EPINEPHrine 0.3 mg/0.3 mL IJ SOAJ injection, Inject 0.3 mg into the muscle as needed for anaphylaxis. for allergic reaction   ezetimibe (ZETIA) 10 MG tablet, TAKE 1 TABLET BY MOUTH EVERY DAY   hydrochlorothiazide (HYDRODIURIL) 25 MG tablet, TAKE 1 TABLET BY MOUTH EVERY DAY   nitroGLYCERIN (NITROSTAT) 0.4 MG SL tablet, PLACE 1 TABLET UNDER THE TONGUE EVERY 5 (FIVE) MINUTES AS NEEDED.   valsartan (DIOVAN) 320 MG tablet, TAKE 1 TABLET BY MOUTH EVERY DAY  Current Outpatient Medications (Respiratory):    albuterol (PROAIR HFA) 108 (90 Base) MCG/ACT inhaler, Inhale 1-2 puffs into the lungs every 6 (six) hours as needed for wheezing or shortness of breath.   ARNUITY ELLIPTA 200 MCG/ACT AEPB, Inhale 1 puff into the  lungs daily.   FLOVENT HFA 110 MCG/ACT inhaler, Inhale 1 puff into the lungs 2 (two) times daily.    fluticasone (FLONASE) 50 MCG/ACT nasal spray, Place 1 spray into both nostrils daily.   ipratropium (ATROVENT HFA) 17 MCG/ACT inhaler, Inhale 2 puffs into the lungs 3 (three) times daily.  Current Outpatient Medications (Analgesics):    acetaminophen (TYLENOL) 500 MG tablet, Take 1,000 mg by mouth every 6 (six) hours as needed for moderate pain or headache.   aspirin EC 81 MG tablet, Take 1 tablet (81 mg total) by mouth 2 (two) times daily after a meal.   HYDROmorphone (DILAUDID) 2 MG tablet, Take 1 tablet (2 mg total) by mouth every 4 (four) hours as needed for severe pain.   traMADol (ULTRAM) 50 MG tablet, Take 1 tablet (50 mg total) by mouth every 6 (six) hours as needed (no driving 6 hours after taking and do not take within 6 hours of diazepam).  Current Outpatient Medications (Hematological):    Cyanocobalamin (B-12) 2500 MCG TABS, Take 1,250 mcg by mouth daily.   folic acid (FOLVITE) 800 MCG tablet, Take 800 mcg by mouth daily.   Current Outpatient Medications (Other):    cholecalciferol (VITAMIN D3) 25 MCG (1000 UNIT) tablet, Take 1,000 Units by mouth daily.   clobetasol cream (TEMOVATE) 0.05 %, APPLY TO AFFECTED AREA TWICE A DAY See Dr. Durene Cal soon and discuss about further refills.   clotrimazole (LOTRIMIN AF) 1 % cream, Apply 1 Application topically 2 (two) times daily.   diazepam (VALIUM) 5 MG tablet, Take 1 tablet (5 mg total) by mouth at bedtime as needed (sleep).   gabapentin (NEURONTIN) 300 MG capsule, Take 2 cap in AM, 1 cap at noon, 2caps at bedtime   GEMTESA 75 MG TABS, TAKE 1 TABLET BY MOUTH EVERY DAY   Glucos-Chond-Hyal Ac-Ca  Fructo (MOVE FREE JOINT HEALTH ADVANCE PO), Take 1 tablet by mouth daily.   ketoconazole (NIZORAL) 2 % cream, Apply 1 application topically See admin instructions. Twice a day every other day   Miconazole Nitrate (LOTRIMIN AF DEODORANT POWDER) 2 %  AERP, Apply 1 spray topically daily as needed (jock itch).   pantoprazole (PROTONIX) 40 MG tablet, TAKE 1 TABLET BY MOUTH EVERY DAY   polyethylene glycol powder (GLYCOLAX/MIRALAX) powder, Take 17 g by mouth daily.   silver sulfADIAZINE (SILVADENE) 1 % cream, Apply 1 Application topically daily.   Wheat Dextrin (BENEFIBER) POWD, Take 1 Dose by mouth daily. 1 dose = 2 teaspoons   Reviewed prior external information including notes and imaging from  primary care provider As well as notes that were available from care everywhere and other healthcare systems.  Refer to neurology recently noted secondary to IV urethral discomfort.  Past medical history, social, surgical and family history all reviewed in electronic medical record.  No pertanent information unless stated regarding to the chief complaint.   Review of Systems:  No headache, visual changes, nausea, vomiting, diarrhea, constipation, dizziness, abdominal pain, s, fevers, chills, night sweats, weight loss, swollen lymph nodes, body aches, joint swelling, chest pain, shortness of breath, mood changes. POSITIVE muscle aches, recent skin changes in the groin  Objective  Blood pressure 110/78, pulse 67, height 5\' 6"  (1.676 m), weight 209 lb (94.8 kg), SpO2 97 %.   General: No apparent distress alert and oriented x3 mood and affect normal, dressed appropriately.  HEENT: Pupils equal, extraocular movements intact  Respiratory: Patient's speak in full sentences and does not appear short of breath  Severely antalgic gait noted.  Patient does have some crepitus noted.  Instability with valgus and varus force still noted on the knee.  Tender over the medial joint line    Impression and Recommendations:    The above documentation has been reviewed and is accurate and complete Judi Saa, DO

## 2022-07-13 NOTE — Assessment & Plan Note (Addendum)
Advised him to complete the antibiotic and then we do not need any further after that but we need to follow back up in another week or 2 now because of the uncertainty about whether it is going to finish resolving with the current plan  This is a very unusual presentation is improving but it is not not necessarily better because he is got new symptoms around the rim and my impression visually it looks even worse than before.  We discussed this further and he says that he also has had some arthritis pain in his hands and knees in the last few weeks.  This raises the specter of a possible diagnosis of reactive arthritis with circinate balanitis labs not available today and were too late in the day anyway so we will go ahead and order some lab work testing to evaluate that possible diagnosis and go ahead and rule out gonorrhea or chlamydia even though those seem very unlikely but because of the uncertainty I have about the diagnosis I want to rule out any other alternative causes even though I do still prefer as my primary diagnostic consideration fungal balanitis and I encouraged him to continue with the current treatment plan  Has not had any conjunctivitis and no GI bug or respiratory infection in the last month or 2 so I dismissed the concept of reactive arthritis and decided against doing the blood work not had sex and the many years so STD I testing seemed unnecessary as well  I've encouraged him to follow up with Dr. Durene Cal and his urologist but has had difficulty getting in

## 2022-07-13 NOTE — Patient Instructions (Signed)
Medication Instructions:  NO CHANGES  *If you need a refill on your cardiac medications before your next appointment, please call your pharmacy*   Lab Work: FASTING lab work to check cholesterol in 1 year -- complete before next visit    Follow-Up: At [facility], you and your health needs are our priority.  As part of our continuing mission to provide you with exceptional heart care, we have created designated Provider Care Teams.  These Care Teams include your primary Cardiologist (physician) and Advanced Practice Providers (APPs -  Physician Assistants and Nurse Practitioners) who all work together to provide you with the care you need, when you need it.  We recommend signing up for the patient portal called "MyChart".  Sign up information is provided on this After Visit Summary.  MyChart is used to connect with patients for Virtual Visits (Telemedicine).  Patients are able to view lab/test results, encounter notes, upcoming appointments, etc.  Non-urgent messages can be sent to your provider as well.   To learn more about what you can do with MyChart, go to [email or web address].    Your next appointment:    12 months with Dr. Rennis Golden

## 2022-07-14 ENCOUNTER — Ambulatory Visit: Payer: PPO | Admitting: Family Medicine

## 2022-07-14 VITALS — BP 110/78 | HR 67 | Ht 66.0 in | Wt 209.0 lb

## 2022-07-14 DIAGNOSIS — M1711 Unilateral primary osteoarthritis, right knee: Secondary | ICD-10-CM | POA: Diagnosis not present

## 2022-07-14 NOTE — Patient Instructions (Signed)
See me again in 4-5 weeks Will get gel approved Wish you the best!

## 2022-07-14 NOTE — Assessment & Plan Note (Signed)
We discussed different treatment options.  Patient felt like he was feeling sick after steroid.  We discussed with patient that we will continue to monitor.  At this point I think viscosupplementation could be more beneficial.  Patient is going to continue to increase activity as tolerated.  Follow-up with me again in 6 to 8 weeks otherwise.  At that time we will adjust treatment accordingly.

## 2022-07-15 DIAGNOSIS — Z8546 Personal history of malignant neoplasm of prostate: Secondary | ICD-10-CM | POA: Diagnosis not present

## 2022-07-15 DIAGNOSIS — B356 Tinea cruris: Secondary | ICD-10-CM | POA: Diagnosis not present

## 2022-07-15 DIAGNOSIS — N3946 Mixed incontinence: Secondary | ICD-10-CM | POA: Diagnosis not present

## 2022-07-24 ENCOUNTER — Ambulatory Visit: Payer: PPO | Admitting: Family Medicine

## 2022-07-24 VITALS — BP 102/58 | HR 76 | Ht 66.0 in

## 2022-07-24 DIAGNOSIS — J301 Allergic rhinitis due to pollen: Secondary | ICD-10-CM | POA: Diagnosis not present

## 2022-07-24 DIAGNOSIS — M1711 Unilateral primary osteoarthritis, right knee: Secondary | ICD-10-CM

## 2022-07-24 DIAGNOSIS — J3081 Allergic rhinitis due to animal (cat) (dog) hair and dander: Secondary | ICD-10-CM | POA: Diagnosis not present

## 2022-07-24 DIAGNOSIS — J3089 Other allergic rhinitis: Secondary | ICD-10-CM | POA: Diagnosis not present

## 2022-07-24 MED ORDER — HYALURONAN 88 MG/4ML IX SOSY
88.0000 mg | PREFILLED_SYRINGE | Freq: Once | INTRA_ARTICULAR | Status: AC
Start: 2022-07-24 — End: 2022-07-24
  Administered 2022-07-24: 88 mg via INTRA_ARTICULAR

## 2022-07-24 NOTE — Patient Instructions (Addendum)
Injected R knee with gel today If worsening pain seek medical attention See you again at next appt

## 2022-07-24 NOTE — Assessment & Plan Note (Signed)
Monovisc given today  , Worsening pain at this time.  Patient did appear very uncomfortable.  Discussed that if any worsening symptoms to seek medical attention immediately.  Has had history of gout previously.  Declined any type of any laboratory history of bladder cancer but is seeing neurologist.  Once again discussed with him to be careful otherwise and if worsening symptoms seek medical attention.

## 2022-07-24 NOTE — Progress Notes (Signed)
[name] [address] [place] [address] Phone: [phone] Subjective:    I'm seeing this patient by the request  of:  Shelva Majestic, MD  CC: Knee pain  [id number]  07/14/2022 We discussed different treatment options. Patient felt like he was feeling sick after steroid. We discussed with patient that we will continue to monitor. At this point I think viscosupplementation could be more beneficial. Patient is going to continue to increase activity as tolerated. Follow-up with me again in 6 to 8 weeks otherwise. At that time we will adjust treatment accordingly.   Updated 07/24/2022 Joshua [name] is a 82 y.o. male coming in with complaint of knee pain. Woke him up several times during the night because of pain. Did take some tylenol. Didn't help. Haven't been able to do exercises because of pain elsewhere. After activity is when having pain. Knee is doing better than it was this morning.       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"   CAD (coronary artery disease)    CABG 2001   Diverticulosis    DJD (degenerative joint disease)    BACK   GERD (gastroesophageal reflux disease)    H/O inguinal hernia repair 12/2018   History of gout    Hyperlipidemia    Hypertension    IBS (irritable bowel syndrome)    Obesity    OSA (obstructive sleep apnea)    USES CPAP    Pancreatitis ~ 1980   Peripheral vascular disease (HCC)    Pre-diabetes    Prostate cancer (HCC) 04/2010   Past Surgical History:  Procedure Laterality Date   ANTERIOR CERVICAL DECOMP/DISCECTOMY FUSION  05/26/2006   PLATE PLACED   BACK SURGERY     CARDIAC CATHETERIZATION  11/20/1999   CORONARY ARTERY BYPASS GRAFT  11/20/1999   SVG-RI1-RI2, SVG-OM, SVG-dRCA    CORONARY STENT INTERVENTION N/A 04/13/2019   Procedure: CORONARY STENT INTERVENTION;  Surgeon: Kathleene Hazel, MD;  Location: [facility];  Service: Cardiovascular;  Laterality: N/A;   HERNIA REPAIR     INGUINAL HERNIA REPAIR Left 01/02/2019   Procedure: OPEN LEFT INGUINAL HERNIA REPAIR WITH MESH;  Surgeon: Luretha Murphy, MD;  Location: [facility];  Service: General;  Laterality: Left;   KNEE ARTHROSCOPY Right 1992; 04/2008   LAPAROSCOPIC CHOLECYSTECTOMY  2007   LEFT HEART CATH AND CORS/GRAFTS ANGIOGRAPHY N/A 04/13/2019   Procedure: LEFT HEART CATH AND CORS/GRAFTS ANGIOGRAPHY;  Surgeon: Kathleene Hazel, MD;  Location: [facility];  Service: Cardiovascular;  Laterality: N/A;   NASAL SEPTUM SURGERY Right 2007   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   ROBOT ASSISTED LAPAROSCOPIC RADICAL PROSTATECTOMY  04/2010   THYROIDECTOMY, PARTIAL  1980   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   Procedure: LEFT TOTAL KNEE ARTHROPLASTY;  Surgeon: Marcene Corning, MD;  Location: [facility];  Service: Orthopedics;  Laterality: Left;   Social History   Socioeconomic History   Marital status: Married    Spouse name: Not on file   Number of children: Not on file   Years of education: Not on file   Highest education level: Associate degree: occupational, Scientist, product/process development, or vocational program  Occupational History   Occupation: retired  Employer: RETIRED  Tobacco Use   Smoking status: Former    Packs/day: 1.00    Years: 35.00    Additional pack years: 0.00    Total pack years: 35.00    Types: Cigarettes    Quit date: 06/16/1992    Years since quitting: 30.1   Smokeless tobacco: Never  Vaping Use   Vaping Use: Never used  Substance and Sexual Activity   Alcohol use: Yes    Alcohol/week: 2.0 standard drinks of alcohol    Types: 2 Shots of liquor per week    Comment: occ   Drug use: No   Sexual activity: Not Currently    Partners: Female  Other Topics Concern   Not on file  Social  History Narrative   Married 42 years in 2015. 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      Hobbies: poker, movies and tv, staying active      Right handed   2 level home with steps he uses   Social Determinants of Corporate investment banker Strain: Low Risk  (07/10/2022)   Overall Financial Resource Strain (CARDIA)    Difficulty of Paying Living Expenses: Not hard at all  Food Insecurity: No Food Insecurity (07/10/2022)   Hunger Vital Sign    Worried About Running Out of Food in the Last Year: Never true    Ran Out of Food in the Last Year: Never true  Transportation Needs: No Transportation Needs (07/10/2022)   PRAPARE - Administrator, Civil Service (Medical): No    Lack of Transportation (Non-Medical): No  Physical Activity: Insufficiently Active (07/10/2022)   Exercise Vital Sign    Days of Exercise per Week: 1 day    Minutes of Exercise per Session: 10 min  Stress: No Stress Concern Present (07/10/2022)   [employer] of Occupational Health - Occupational Stress Questionnaire    Feeling of Stress : Only a little  Social Connections: Moderately Isolated (07/10/2022)   Social Connection and Isolation Panel [NHANES]    Frequency of Communication with Friends and Family: More than three times a week    Frequency of Social Gatherings with Friends and Family: Three times a week    Attends Religious Services: Never    Active Member of Clubs or Organizations: No    Attends Banker Meetings: Never    Marital Status: Married   Allergies  Allergen Reactions   Benadryl [Diphenhydramine] Other (See Comments)    Pt told not to take because of bypass surgery   Bromfed Nausea And Vomiting   Clarithromycin Other (See Comments)     gastritis   Codeine Other (See Comments)    Trouble breathing   Cymbalta [Duloxetine Hcl]     Abdominal pain and later chest pain as well as worsening fatigue     Doxycycline Hyclate Nausea And Vomiting   Lexapro [Escitalopram]     Dizziness   Modafinil Other (See Comments)     anxiety-nervousness   Oxycodone-Acetaminophen Itching   Promethazine Hcl Other (See Comments)     fainting   Quinolones Nausea Only    Cipro "felt real bad"   Repatha [Evolocumab]     Weak, worn out   Telithromycin Nausea And Vomiting   Tramadol Nausea Only   Hydrocodone-Acetaminophen Rash   Statins Other (See Comments)    Leg cramps and makes patient feel  bad. Per pt tried multiple in years past   Sulfonamide Derivatives Rash   Family History  Problem Relation Age of Onset   Heart disease Mother    Hyperlipidemia Mother    Heart disease Father    Hyperlipidemia Father    Diabetes Brother    Colon cancer Neg Hx    Pancreatic cancer Neg Hx    Esophageal cancer Neg Hx    Stomach cancer Neg Hx    Liver cancer Neg Hx      Current Outpatient Medications (Cardiovascular):    Alirocumab (PRALUENT) 150 MG/ML SOAJ, INJECT 1 DOSE INTO THE SKIN EVERY 14 (FOURTEEN) DAYS.   amLODipine (NORVASC) 5 MG tablet, TAKE 1 TABLET (5 MG TOTAL) BY MOUTH DAILY.   EPINEPHrine 0.3 mg/0.3 mL IJ SOAJ injection, Inject 0.3 mg into the muscle as needed for anaphylaxis. for allergic reaction   ezetimibe (ZETIA) 10 MG tablet, TAKE 1 TABLET BY MOUTH EVERY DAY   hydrochlorothiazide (HYDRODIURIL) 25 MG tablet, TAKE 1 TABLET BY MOUTH EVERY DAY   nitroGLYCERIN (NITROSTAT) 0.4 MG SL tablet, PLACE 1 TABLET UNDER THE TONGUE EVERY 5 (FIVE) MINUTES AS NEEDED.   valsartan (DIOVAN) 320 MG tablet, TAKE 1 TABLET BY MOUTH EVERY DAY  Current Outpatient Medications (Respiratory):    albuterol (PROAIR HFA) 108 (90 Base) MCG/ACT inhaler, Inhale 1-2 puffs into the lungs every 6 (six) hours as needed for wheezing or shortness of breath.   ARNUITY ELLIPTA 200 MCG/ACT AEPB, Inhale 1 puff into the lungs daily.   FLOVENT HFA 110 MCG/ACT inhaler, Inhale 1 puff into the lungs 2 (two) times daily.    fluticasone  (FLONASE) 50 MCG/ACT nasal spray, Place 1 spray into both nostrils daily.   ipratropium (ATROVENT HFA) 17 MCG/ACT inhaler, Inhale 2 puffs into the lungs 3 (three) times daily.  Current Outpatient Medications (Analgesics):    acetaminophen (TYLENOL) 500 MG tablet, Take 1,000 mg by mouth every 6 (six) hours as needed for moderate pain or headache.   aspirin EC 81 MG tablet, Take 1 tablet (81 mg total) by mouth 2 (two) times daily after a meal.   HYDROmorphone (DILAUDID) 2 MG tablet, Take 1 tablet (2 mg total) by mouth every 4 (four) hours as needed for severe pain.   traMADol (ULTRAM) 50 MG tablet, Take 1 tablet (50 mg total) by mouth every 6 (six) hours as needed (no driving 6 hours after taking and do not take within 6 hours of diazepam).  Current Outpatient Medications (Hematological):    Cyanocobalamin (B-12) 2500 MCG TABS, Take 1,250 mcg by mouth daily.   folic acid (FOLVITE) 800 MCG tablet, Take 800 mcg by mouth daily.   Current Outpatient Medications (Other):    cholecalciferol (VITAMIN D3) 25 MCG (1000 UNIT) tablet, Take 1,000 Units by mouth daily.   clobetasol cream (TEMOVATE) 0.05 %, APPLY TO AFFECTED AREA TWICE A DAY See Dr. Durene Cal soon and discuss about further refills.   clotrimazole (LOTRIMIN AF) 1 % cream, Apply 1 Application topically 2 (two) times daily.   diazepam (VALIUM) 5 MG tablet, Take 1 tablet (5 mg total) by mouth at bedtime as needed (sleep).   gabapentin (NEURONTIN) 300 MG capsule, Take 2 cap in AM, 1 cap at noon, 2caps at bedtime   GEMTESA 75 MG TABS, TAKE 1 TABLET BY MOUTH EVERY DAY   Glucos-Chond-Hyal Ac-Ca Fructo (MOVE FREE JOINT HEALTH ADVANCE PO), Take 1 tablet by mouth daily.   ketoconazole (NIZORAL) 2 % cream, Apply 1 application topically See admin instructions.  Twice a day every other day   Miconazole Nitrate (LOTRIMIN AF DEODORANT POWDER) 2 % AERP, Apply 1 spray topically daily as needed (jock itch).   pantoprazole (PROTONIX) 40 MG tablet, TAKE 1 TABLET BY  MOUTH EVERY DAY   polyethylene glycol powder (GLYCOLAX/MIRALAX) powder, Take 17 g by mouth daily.   silver sulfADIAZINE (SILVADENE) 1 % cream, Apply 1 Application topically daily.   Wheat Dextrin (BENEFIBER) POWD, Take 1 Dose by mouth daily. 1 dose = 2 teaspoons   Reviewed prior external information including notes and imaging from  primary care provider As well as notes that were available from care everywhere and other healthcare systems.  Past medical history, social, surgical and family history all reviewed in electronic medical record.  No pertanent information unless stated regarding to the chief complaint.   Review of Systems:  No headache, visual changes, nausea, vomiting, diarrhea, constipation, dizziness, abdominal pain, skin rash, fevers, chills, night sweats, weight loss, swollen lymph nodes, body aches, joint swelling, chest pain, shortness of breath, mood changes. POSITIVE muscle aches  Objective  Blood pressure (!) 102/58, pulse 76, height 5\' 6"  (1.676 m), SpO2 91 %.   General: No apparent distress alert and oriented x3 mood and affect normal, dressed appropriately.  HEENT: Pupils equal, extraocular movements intact  Respiratory: Patient's speak in full sentences and does not appear short of breath  Antalgic gait is noted.  Trace effusion noted to the lower extremities bilaterally. Knee is tender to palpation.  Left knee replacement noted.  After informed written and verbal consent, patient was seated on exam table. Right knee was prepped with alcohol swab and utilizing anterolateral approach, patient's right knee space was injected with 48 mg per 3 mL of Monovisc (sodium hyaluronate) in a prefilled syringe was injected easily into the knee through a 22-gauge needle..Patient tolerated the procedure well without immediate complications.    Impression and Recommendations:     The above documentation has been reviewed and is accurate and complete Judi Saa, DO

## 2022-07-29 ENCOUNTER — Ambulatory Visit (INDEPENDENT_AMBULATORY_CARE_PROVIDER_SITE_OTHER): Payer: PPO | Admitting: Internal Medicine

## 2022-07-29 ENCOUNTER — Ambulatory Visit: Payer: PPO | Admitting: Sports Medicine

## 2022-07-29 ENCOUNTER — Other Ambulatory Visit (HOSPITAL_COMMUNITY)
Admission: RE | Admit: 2022-07-29 | Discharge: 2022-07-29 | Disposition: A | Discharge status: Home / Self Care | Payer: PPO | Source: Ambulatory Visit | Attending: Internal Medicine | Admitting: Internal Medicine

## 2022-07-29 ENCOUNTER — Other Ambulatory Visit: Payer: Self-pay | Admitting: Internal Medicine

## 2022-07-29 ENCOUNTER — Encounter: Payer: Self-pay | Admitting: Internal Medicine

## 2022-07-29 VITALS — BP 112/64 | HR 83 | Temp 97.7°F | Ht 66.0 in | Wt 209.6 lb

## 2022-07-29 VITALS — BP 122/78 | HR 52 | Ht 66.0 in | Wt 209.0 lb

## 2022-07-29 DIAGNOSIS — N481 Balanitis: Secondary | ICD-10-CM

## 2022-07-29 DIAGNOSIS — M7061 Trochanteric bursitis, right hip: Secondary | ICD-10-CM | POA: Diagnosis not present

## 2022-07-29 LAB — URINALYSIS, ROUTINE W REFLEX MICROSCOPIC
Bilirubin Urine: NEGATIVE
Hgb urine dipstick: NEGATIVE
Ketones, ur: NEGATIVE
Leukocytes,Ua: NEGATIVE
Nitrite: NEGATIVE
Specific Gravity, Urine: 1.025 (ref 1.000–1.030)
Total Protein, Urine: NEGATIVE
Urine Glucose: NEGATIVE
Urobilinogen, UA: 0.2 (ref 0.0–1.0)
pH: 5.5 (ref 5.0–8.0)

## 2022-07-29 MED ORDER — NYSTATIN 100000 UNIT/GM EX POWD
1.0000 | Freq: Three times a day (TID) | CUTANEOUS | 0 refills | Status: DC
Start: 2022-07-29 — End: 2022-08-05

## 2022-07-29 MED ORDER — KETOROLAC TROMETHAMINE 60 MG/2ML IM SOLN
60.0000 mg | Freq: Once | INTRAMUSCULAR | Status: AC
  Administered 2022-07-29: 60 mg via INTRAMUSCULAR

## 2022-07-29 MED ORDER — CEPHALEXIN 500 MG PO CAPS
500.0000 mg | ORAL_CAPSULE | Freq: Three times a day (TID) | ORAL | 0 refills | Status: DC
Start: 2022-07-29 — End: 2022-07-30

## 2022-07-29 NOTE — Progress Notes (Signed)
Joshua [name] D.[facility] [address] [place] [address] Phone: [phone]   Assessment and Plan:     1. 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  Discussed alternatives and patient agreed for Toradol injection at greater trochanter.  Tolerated well per note below - Start HEP for gluteal muscles and IT band - May use topical Voltaren gel over areas of pain  Procedure: Greater trochanteric bursal injection Side: Right  Risks explained and consent was given verbally. The site was cleaned with alcohol prep. A steroid injection was performed with patient in the lateral side-lying position at area of maximum tenderness over greater trochanter using 60 mg Toradol. This was well tolerated and resulted in symptomatic relief.  Needle was removed, hemostasis achieved, and post injection instructions were explained.  Pt was advised to call or return to clinic if these symptoms worsen or fail to improve as anticipated.     Pertinent previous records reviewed include hip x-ray 02/17/2021   Follow Up: Patient already has follow-up appointment scheduled for 08/11/2022 with Dr. Katrinka Blazing.  Recommend keeping this appointment and may follow-up as needed with me if additional concerns arise   Subjective:   I, Moenique Parris, am serving as a Neurosurgeon for Doctor Richardean Sale  Chief Complaint: right hip pain  HPI:   07/29/22 Patient is a 82 year old male complaining of right hip pain. Patient states that he has pain when he  walks, he received  a csi last Friday , the next day he had hip and leg pain, states he has lateral leg pain , he isnt able to go shopping, tylenol arthritis at night for the pain and that helps him sleep through the night   Relevant Historical Information:  History of total left knee replacement, hypertension, GERD, CKD, CABG  Additional pertinent review of systems negative.   Current Outpatient Medications:    acetaminophen (TYLENOL) 500 MG tablet, Take 1,000 mg by mouth every 6 (six) hours as needed for moderate pain or headache., Disp: , Rfl:    albuterol (PROAIR HFA) 108 (90 Base) MCG/ACT inhaler, Inhale 1-2 puffs into the lungs every 6 (six) hours as needed for wheezing or shortness of breath., Disp: 18 g, Rfl: 5   Alirocumab (PRALUENT) 150 MG/ML SOAJ, INJECT 1 DOSE INTO THE SKIN EVERY 14 (FOURTEEN) DAYS., Disp: 6 mL, Rfl: 3   amLODipine (NORVASC) 5 MG tablet, TAKE 1 TABLET (5 MG TOTAL) BY MOUTH DAILY., Disp: 90 tablet, Rfl: 3   ARNUITY ELLIPTA 200 MCG/ACT AEPB, Inhale 1 puff into the lungs daily., Disp: , Rfl:    aspirin EC 81 MG tablet, Take 1 tablet (81 mg total) by mouth 2 (two) times daily after a meal., Disp: 30 tablet, Rfl: 11   cholecalciferol (VITAMIN D3) 25 MCG (1000 UNIT) tablet, Take 1,000 Units by mouth daily., Disp: , Rfl:    clobetasol cream (TEMOVATE) 0.05 %, APPLY TO AFFECTED AREA TWICE A DAY See Dr. Durene Cal soon and discuss about further refills., Disp: 30 g, Rfl: 0   clotrimazole (LOTRIMIN AF) 1 %  cream, Apply 1 Application topically 2 (two) times daily., Disp: 30 g, Rfl: 1   Cyanocobalamin (B-12) 2500 MCG TABS, Take 1,250 mcg by mouth daily., Disp: , Rfl:    diazepam (VALIUM) 5 MG tablet, Take 1 tablet (5 mg total) by mouth at bedtime as needed (sleep)., Disp: 30 tablet, Rfl: 1   EPINEPHrine 0.3 mg/0.3 mL IJ SOAJ injection, Inject 0.3 mg into the muscle as needed for anaphylaxis. for allergic reaction, Disp: , Rfl:    ezetimibe (ZETIA) 10 MG tablet, TAKE 1 TABLET BY MOUTH  EVERY DAY, Disp: 90 tablet, Rfl: 2   FLOVENT HFA 110 MCG/ACT inhaler, Inhale 1 puff into the lungs 2 (two) times daily. , Disp: , Rfl: 5   fluticasone (FLONASE) 50 MCG/ACT nasal spray, Place 1 spray into both nostrils daily., Disp: , Rfl:    folic acid (FOLVITE) 800 MCG tablet, Take 800 mcg by mouth daily. , Disp: , Rfl:    gabapentin (NEURONTIN) 300 MG capsule, Take 2 cap in AM, 1 cap at noon, 2caps at bedtime, Disp: 450 capsule, Rfl: 3   GEMTESA 75 MG TABS, TAKE 1 TABLET BY MOUTH EVERY DAY, Disp: 90 tablet, Rfl: 3   Glucos-Chond-Hyal Ac-Ca Fructo (MOVE FREE JOINT HEALTH ADVANCE PO), Take 1 tablet by mouth daily., Disp: , Rfl:    hydrochlorothiazide (HYDRODIURIL) 25 MG tablet, TAKE 1 TABLET BY MOUTH EVERY DAY, Disp: 90 tablet, Rfl: 2   ipratropium (ATROVENT HFA) 17 MCG/ACT inhaler, Inhale 2 puffs into the lungs 3 (three) times daily., Disp: 1 each, Rfl: 1   ketoconazole (NIZORAL) 2 % cream, Apply 1 application topically See admin instructions. Twice a day every other day, Disp: , Rfl:    Miconazole Nitrate (LOTRIMIN AF DEODORANT POWDER) 2 % AERP, Apply 1 spray topically daily as needed (jock itch)., Disp: , Rfl:    nitroGLYCERIN (NITROSTAT) 0.4 MG SL tablet, PLACE 1 TABLET UNDER THE TONGUE EVERY 5 (FIVE) MINUTES AS NEEDED., Disp: 75 tablet, Rfl: 1   pantoprazole (PROTONIX) 40 MG tablet, TAKE 1 TABLET BY MOUTH EVERY DAY, Disp: 90 tablet, Rfl: 3   polyethylene glycol powder (GLYCOLAX/MIRALAX) powder, Take 17 g by mouth daily., Disp: 255 g, Rfl: 0   silver sulfADIAZINE (SILVADENE) 1 % cream, Apply 1 Application topically daily., Disp: , Rfl:    traMADol (ULTRAM) 50 MG tablet, Take 1 tablet (50 mg total) by mouth every 6 (six) hours as needed (no driving 6 hours after taking and do not take within 6 hours of diazepam)., Disp: 20 tablet, Rfl: 1   valsartan (DIOVAN) 320 MG tablet, TAKE 1 TABLET BY MOUTH EVERY DAY, Disp: 90 tablet, Rfl: 3   Wheat Dextrin (BENEFIBER) POWD, Take 1 Dose by mouth daily. 1  dose = 2 teaspoons, Disp: , Rfl:    HYDROmorphone (DILAUDID) 2 MG tablet, Take 1 tablet (2 mg total) by mouth every 4 (four) hours as needed for severe pain., Disp: 12 tablet, Rfl: 0   Objective:     Vitals:   07/29/22 1100  BP: 122/78  Pulse: (!) 52  SpO2: 96%  Weight: 209 lb (94.8 kg)  Height: 5\' 6"  (1.676 m)      Body mass index is 33.73 kg/m.    Physical Exam:    General: awake, alert, and oriented no acute distress, nontoxic Skin: no suspicious lesions or rashes Neuro:sensation intact distally with no deficits, normal muscle tone, no atrophy, strength 5/5 in all tested lower ext groups Psych: normal mood and affect, speech clear  Right hip: No deformity, swelling or wasting ROM Flexion 90, ext 20, IR 35, ER 40 TTP right greater trochanter, gluteal musculature, IT band NTTP over the hip flexors,   si joint, lumbar spine Negative log roll with FROM Negative FABER Negative FADIR    Electronically signed by:  Joshua [name] D.[facility] 11:27 AM 07/29/22

## 2022-07-29 NOTE — Addendum Note (Signed)
Addended by: Jerene Canny R on: 07/29/2022 11:34 AM   Modules accepted: Orders

## 2022-07-29 NOTE — Patient Instructions (Signed)
It was a pleasure seeing you today!  Your health and satisfaction are my top priorities. If you believe your experience today was worthy of a 5-star rating, I would be grateful for your feedback! [name], MD   [x]    I always recommend close follow up (within 1-2 weeks) with your Primary Care Provider (PCP) for any acute problems.  If you are not doing well:  Return to the office sooner.  If your condition begins worsening or become severe or you can't get a sooner appointment :  go to the emergency room.  [x]    Please carefully review your clinical instructions and notes on MyChart (they will be finished later)  Today's draft of the physician documented plan for today's visit: (final revisions will be visible on MyChart chart later)  Balanitis Assessment & Plan: Improved per patient report, and somewhat improved by images in my medical opinion - although still symptomatic at left shaft and urethral orifice despite adherence to barrier ointment and antifungal cream Red irritated skin spot on left shaft not improving with antifungal, will treat as cellulitis with antibiotic(s) Note the cloudy urine- no symptom(s) of cystitis but will check urinalysis with reflex and urine cytology although sexual transmitted infection risk very low. Gave nystatin powder to absorb and control fungus - for him to put on his urinary pads.   Through a comprehensive analysis of anonymized patient data, we informed an AI-generated report. All AI suggestions underwent careful evaluation against established guidelines to ensure optimal patient care. During our collaborative review of the report's findings, we discussed potential diagnoses, recommended tests, treatment options, and next steps. I explained the rationale behind the AI recommendations, integrating my clinical judgment for the patient's understanding.  In collaboration with the patient, we explored the potential benefits and risks associated with  certain categories of AI-suggested interventions. For example, we discussed the advantages and considerations of more aggressive treatment options. Ultimately, the patient elected to closely monitor the situation based on their preferences and our shared understanding of their condition. Patient's best interests and evidence-based practices guided all decisions, respecting their autonomy. Moving forward, we will continue close monitoring and revisit these options at the next visit or sooner if their condition changes. The patient was encouraged to keep me informed of any developments."  AI-Generated Report (red):        The Question 82 year old male with urinary incontinence has improving presumed fungal balanitis at the urethral orifice and left side of where shaft meets the glans penis. should antibiotic be given for possible secondary cellulitis of the penis if the redness isnt improving with antifungal. there is also cloudiness to urine. what other advice should be given.  Design Strategy To address this question, we will apply our comprehensive understanding of infectious diseases, dermatology, and urology. The strategy involves evaluating the clinical presentation of presumed fungal balanitis and the potential for secondary bacterial infection, such as cellulitis, which may require antibiotic therapy. Additionally, we will consider the significance of cloudy urine and its implications for the patient's overall management. This approach will ensure a holistic assessment of the patient's condition, leading to evidence-based recommendations for treatment and further evaluation.  Execute Strategy Upon analyzing the question, the presence of fungal balanitis is indicated by the improvement with antifungal treatment. However, persistent redness raises the concern for a possible secondary bacterial infection, such as cellulitis. The decision to add antibiotic therapy should be based on clinical signs  indicative of bacterial infection, such as increased  warmth, spreading erythema, or purulence, which are common features of cellulitis  . Cloudiness of urine may suggest a urinary tract infection (UTI), which is not uncommon in patients with urinary incontinence and could be related or separate from the penile infection.  Systematically Ensure Accuracy & Precision Re-evaluating the patient's symptoms and the potential for secondary bacterial infection, it is crucial to differentiate between the continuation of fungal infection symptoms and signs of bacterial cellulitis. The decision to prescribe antibiotics should be based on a thorough clinical evaluation. Additionally, the cloudiness of urine warrants further investigation to rule out a UTI or other urinary abnormalities. Advice on hygiene, monitoring of symptoms, and when to seek further medical evaluation should also be provided.  Final Answer Antibiotics should be considered for the treatment of possible secondary cellulitis of the penis if clinical evaluation reveals signs of bacterial infection, such as spreading erythema, increased warmth, or purulence, despite the use of antifungals  . Further evaluation of the cloudy urine with a urinalysis and possibly a urine culture is recommended to assess for a urinary tract infection or other causes. Additional advice includes maintaining good genital hygiene, monitoring for signs of worsening infection, and prompt follow-up with healthcare providers if symptoms do not improve or worsen.  Orders: -     Nystatin; Apply 1 Application topically 3 (three) times daily.  Dispense: 15 g; Refill: 0 -     Urinalysis, Routine w reflex microscopic -     Cephalexin; Take 1 capsule (500 mg total) by mouth 3 (three) times daily for 10 days. Take for 7 days  Dispense: 30 capsule; Refill: 0 -     Cytology (oral, anal, urethral) ancillary only     QUESTIONS & CONCERNS: CLINICAL: please contact us via phone [phone]  [phone] OR MyChart messaging  LAB & IMAGING:   We will call you if the results are significantly abnormal or you don't use MyChart.  Most normal results will be posted to MyChart immediately and have a clinical review message by Dr. Jon Billings posted within 2-3 business days.   If you have not heard from [place] regarding the results in 2 weeks OR if you need priority reporting, please contact this office. MYCHART:  The fastest way to get your results and easiest way to stay in touch with [place] is by activating your My Chart account. Instructions are located on the last page of this paperwork.  BILLING: xray and lab orders are billed from separate companies and questions./concerns should be directed to the invoicing company.  For visit charges please discuss with our administrative services. COMPLAINTS:  please let Dr. Jon Billings know or see the [facility] Administrator - Edwena Felty or [facility], by asking at the front desk: we want you to be satisfied with every experience and we would be grateful for the opportunity to address any problems.

## 2022-07-29 NOTE — Assessment & Plan Note (Addendum)
Improved per patient report, and somewhat improved by images in my medical opinion - although still symptomatic at left shaft and urethral orifice despite adherence to barrier ointment and antifungal cream Red irritated skin spot on left shaft not improving with antifungal, will treat as cellulitis with antibiotic(s) Note the cloudy urine- no symptom(s) of cystitis but will check urinalysis with reflex and urine cytology although sexual transmitted infection risk very low. Urine from the bladder was clear so if urinary tract infection (UTI) might be contaminant.  Nonetheless- will choose a safer low side effect(s) antibiotic to treat in case of cellulitis(keflex) Gave nystatin powder to absorb and control fungus - for him to put on his urinary pads.   Through a comprehensive analysis of anonymized patient data, we informed an AI-generated report. All AI suggestions underwent careful evaluation against established guidelines to ensure optimal patient care. During our collaborative review of the report's findings, we discussed potential diagnoses, recommended tests, treatment options, and next steps. I explained the rationale behind the AI recommendations, integrating my clinical judgment for the patient's understanding.  In collaboration with the patient, we explored the potential benefits and risks associated with certain categories of AI-suggested interventions. For example, we discussed the advantages and considerations of more aggressive treatment options. Ultimately, the patient elected to closely monitor the situation based on their preferences and our shared understanding of their condition. Patient's best interests and evidence-based practices guided all decisions, respecting their autonomy. Moving forward, we will continue close monitoring and revisit these options at the next visit or sooner if their condition changes. The patient was encouraged to keep me informed of any  developments."  AI-Generated Report (red):        The Question 82 year old male with urinary incontinence has improving presumed fungal balanitis at the urethral orifice and left side of where shaft meets the glans penis. should antibiotic be given for possible secondary cellulitis of the penis if the redness isnt improving with antifungal. there is also cloudiness to urine. what other advice should be given.  Design Strategy To address this question, we will apply our comprehensive understanding of infectious diseases, dermatology, and urology. The strategy involves evaluating the clinical presentation of presumed fungal balanitis and the potential for secondary bacterial infection, such as cellulitis, which may require antibiotic therapy. Additionally, we will consider the significance of cloudy urine and its implications for the patient's overall management. This approach will ensure a holistic assessment of the patient's condition, leading to evidence-based recommendations for treatment and further evaluation.  Execute Strategy Upon analyzing the question, the presence of fungal balanitis is indicated by the improvement with antifungal treatment. However, persistent redness raises the concern for a possible secondary bacterial infection, such as cellulitis. The decision to add antibiotic therapy should be based on clinical signs indicative of bacterial infection, such as increased warmth, spreading erythema, or purulence, which are common features of cellulitis  . Cloudiness of urine may suggest a urinary tract infection (UTI), which is not uncommon in patients with urinary incontinence and could be related or separate from the penile infection.  Systematically Ensure Accuracy & Precision Re-evaluating the patient's symptoms and the potential for secondary bacterial infection, it is crucial to differentiate between the continuation of fungal infection symptoms and signs of bacterial  cellulitis. The decision to prescribe antibiotics should be based on a thorough clinical evaluation. Additionally, the cloudiness of urine warrants further investigation to rule out a UTI or other urinary abnormalities. Advice on hygiene, monitoring of symptoms,  and when to seek further medical evaluation should also be provided.  Final Answer Antibiotics should be considered for the treatment of possible secondary cellulitis of the penis if clinical evaluation reveals signs of bacterial infection, such as spreading erythema, increased warmth, or purulence, despite the use of antifungals  . Further evaluation of the cloudy urine with a urinalysis and possibly a urine culture is recommended to assess for a urinary tract infection or other causes. Additional advice includes maintaining good genital hygiene, monitoring for signs of worsening infection, and prompt follow-up with healthcare providers if symptoms do not improve or worsen.

## 2022-07-29 NOTE — Progress Notes (Signed)
[facility]: [phone]   Routine Medical Office Visit  Patient:  Joshua [name]      Age: 82 y.o.       Sex:  male  Date:   07/29/2022 PCP:    Shelva Majestic, MD   Today's Healthcare Provider: [name], MD       Assessment and Plan:   Balanitis Assessment & Plan: Improved per patient report, and somewhat improved by images in my medical opinion - although still symptomatic at left shaft and urethral orifice despite adherence to barrier ointment and antifungal cream Red irritated skin spot on left shaft not improving with antifungal, will treat as cellulitis with antibiotic(s) Note the cloudy urine- no symptom(s) of cystitis but will check urinalysis with reflex Gave nystatin powder to absorb and control fungus - for him to put on his urinary pads.   Through a comprehensive analysis of anonymized patient data, we informed an AI-generated report. All AI suggestions underwent careful evaluation against established guidelines to ensure optimal patient care. During our collaborative review of the report's findings, we discussed potential diagnoses, recommended tests, treatment options, and next steps. I explained the rationale behind the AI recommendations, integrating my clinical judgment for the patient's understanding.  In collaboration with the patient, we explored the potential benefits and risks associated with certain categories of AI-suggested interventions. For example, we discussed the advantages and considerations of more aggressive treatment options. Ultimately, the patient elected to closely monitor the situation based on their preferences and our shared understanding of their condition. Patient's best interests and evidence-based practices guided all decisions, respecting their autonomy. Moving forward, we will continue close monitoring and revisit these options at the next visit or sooner if their condition changes. The patient was encouraged  to keep me informed of any developments."  AI-Generated Report (red):        The Question 82 year old male with urinary incontinence has improving presumed fungal balanitis at the urethral orifice and left side of where shaft meets the glans penis. should antibiotic be given for possible secondary cellulitis of the penis if the redness isnt improving with antifungal. there is also cloudiness to urine. what other advice should be given.  Design Strategy To address this question, we will apply our comprehensive understanding of infectious diseases, dermatology, and urology. The strategy involves evaluating the clinical presentation of presumed fungal balanitis and the potential for secondary bacterial infection, such as cellulitis, which may require antibiotic therapy. Additionally, we will consider the significance of cloudy urine and its implications for the patient's overall management. This approach will ensure a holistic assessment of the patient's condition, leading to evidence-based recommendations for treatment and further evaluation.  Execute Strategy Upon analyzing the question, the presence of fungal balanitis is indicated by the improvement with antifungal treatment. However, persistent redness raises the concern for a possible secondary bacterial infection, such as cellulitis. The decision to add antibiotic therapy should be based on clinical signs indicative of bacterial infection, such as increased warmth, spreading erythema, or purulence, which are common features of cellulitis  . Cloudiness of urine may suggest a urinary tract infection (UTI), which is not uncommon in patients with urinary incontinence and could be related or separate from the penile infection.  Systematically Ensure Accuracy & Precision Re-evaluating the patient's symptoms and the potential for secondary bacterial infection, it is crucial to differentiate between the continuation of fungal infection symptoms and  signs of bacterial cellulitis. The decision to prescribe antibiotics should  be based on a thorough clinical evaluation. Additionally, the cloudiness of urine warrants further investigation to rule out a UTI or other urinary abnormalities. Advice on hygiene, monitoring of symptoms, and when to seek further medical evaluation should also be provided.  Final Answer Antibiotics should be considered for the treatment of possible secondary cellulitis of the penis if clinical evaluation reveals signs of bacterial infection, such as spreading erythema, increased warmth, or purulence, despite the use of antifungals  . Further evaluation of the cloudy urine with a urinalysis and possibly a urine culture is recommended to assess for a urinary tract infection or other causes. Additional advice includes maintaining good genital hygiene, monitoring for signs of worsening infection, and prompt follow-up with healthcare providers if symptoms do not improve or worsen.  Orders: -     Nystatin; Apply 1 Application topically 3 (three) times daily.  Dispense: 15 g; Refill: 0 -     Urinalysis, Routine w reflex microscopic -     Cephalexin; Take 1 capsule (500 mg total) by mouth 3 (three) times daily for 10 days. Take for 7 days  Dispense: 30 capsule; Refill: 0 -     Cytology (oral, anal, urethral) ancillary only       Treatment plan discussed and reviewed in detail. Explained medication safety and potential side effects. Agreed on patient returning to office if symptoms worsen, persist, or new symptoms develop. Discussed precautions in case of needing to visit the Emergency Department. Answered all patient questions and confirmed understanding and comfort with the plan. Encouraged patient to contact our office if they have any questions or concerns.         Clinical Presentation:   82 y.o. male here today for Follow-up  HPI   Updated chart data:  Problem  Balanitis   Interim history: 07/29/22 obsessing about  moisture control, thinks its improving, but still symptomatic despite using antifungal cream and barrier ointment. Photographs Taken 07/29/2022 :     Interim history: 07/13/22 he reports that the dysuria and burning with urination is way down and it still just at the tip of the penis or the urine comes out the urethral orifice.  He feels like he is getting better by 40% or so based on symptom analysis but is still very tender at the tip of the penis there is also some tenderness around the edge of the glans where it meets the shaft Photographs Taken 07/13/22 have some white stuff he thinks is the antifungal/barrier cream.  He is been able to keep fairly dry.     Prior history: 07/06/22 82 year old male with urinary incontinence and radical prostatectomy history presents with a urethral orifice erosion and balanitis most likely from fungus due to the urinary incontinence.  In addition to creams and ointments he wants to know what else he can do to keep himself dry as he is already wearing pads  Photographs Taken 07/06/22: erosion and tenderness and skin/mucosal erosion and rash at the urethral orifice.         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   Essential hypertension 02/21/2007   Coronary atherosclerosis 10/12/2006   Asthma 10/12/2006   GERD 10/12/2006   OSTEOARTHRITIS, LOWER LEG, LEFT 10/04/2009   OSA (obstructive sleep apnea) 11/02/2006   Generalized abdominal pain 01/31/2009   History of bladder cancer 04/01/2010   Chronic bilateral low back pain with left-sided sciatica 10/09/2011   Neuropathy (  HCC) 08/21/2013   Dizziness 08/23/2013   CKD (chronic kidney disease), stage III (HCC) 10/23/2013   Fatigue 11/09/2013   Allergic rhinitis 05/31/2014   Hereditary and idiopathic peripheral neuropathy 09/25/2014   Obesity, Class I, BMI 30-34.9 10/03/2015   Gout 11/20/2016   Venous  insufficiency 12/29/2016   Ganglion cyst of tendon sheath of right hand 12/29/2016   Neck pain 02/08/2017   DJD (degenerative joint disease)    Arthritis    Weakness of left leg 06/21/2017   Senile purpura (HCC) 10/07/2017   Hyperglycemia 10/07/2017   Aortic atherosclerosis (HCC) 05/05/2018   Abdominal muscle strain, initial encounter 06/16/2018   Greater trochanteric bursitis of left hip 10/27/2018   Degenerative disc disease, lumbar 10/27/2018   Educated about COVID-19 virus infection 04/20/2019   Left lateral epicondylitis 09/05/2019   Overactive bladder 11/24/2019   Trigger thumb, left thumb 01/24/2020   Coronary artery disease involving coronary bypass graft of native heart without angina pectoris 04/07/2020   Pre-operative respiratory examination 06/05/2020   Primary osteoarthritis of left knee 06/18/2020   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) 09/25/2020   Bloating 12/27/2020   Hyperactive bowel sounds 01/29/2021   Greater trochanteric bursitis of right hip 02/17/2021   Degenerative cervical disc 01/12/2022   Left hamstring injury 04/21/2022   Degenerative arthritis of right knee 06/08/2022   Balanitis 07/06/2022   Urinary frequency 07/06/2022   Resolved Ambulatory Problems    Diagnosis Date Noted   THRUSH 05/01/2009   Acute frontal sinusitis 01/06/2010   ASTHMA UNSPECIFIED WITH EXACERBATION 04/22/2009   CONSTIPATION 01/31/2009   LOC OSTEOARTHROS NOT SPEC PRIM/SEC PELV RGN&THI 12/26/2008   OSTEOARTHROSIS NOS, UNSPECIFIED SITE 11/02/2006   HIP PAIN, RIGHT 12/26/2008   MEDIAL EPICONDYLITIS, LEFT 08/25/2007   HEEL PAIN, RIGHT 10/10/2008   SYMPTOM, MEMORY LOSS 12/14/2006   SHORTNESS OF BREATH (SOB) 05/01/2009   CHEST PAIN-UNSPECIFIED 05/01/2009   PULMONARY CONGESTION 04/12/2009   Abdominal pain, epigastric 09/14/2008   MEDIAL MENISCUS TEAR 05/05/2007   TOBACCO ABUSE, HX OF 12/26/2008   Pain in lower limb 11/07/2013   Precordial pain 02/06/2014   Neuralgia  04/10/2014   Porokeratosis 04/10/2014   Anal fissure 12/06/2014   Rectal bleeding 12/06/2014   CN (constipation) 12/06/2014   Musculoskeletal chest pain 04/24/2015   Dyslipidemia 09/23/2016   Overweight 09/23/2016   Prostate cancer (HCC) 04/23/2010   Pneumonia    Pancreatitis    Obesity    IBS (irritable bowel syndrome)    Hypertension    History of gout    History of blood transfusion 03/24/1999   GERD (gastroesophageal reflux disease)    Diverticulosis    Depression    Coronary artery disease involving native coronary artery of native heart without angina pectoris    Bladder cancer (HCC) 04/23/2010   Anemia    Acute medial meniscal tear    Exertional angina 04/18/2018   Past Medical History:  Diagnosis Date   Asthma    CAD (coronary artery disease)    H/O inguinal hernia repair 12/2018   Peripheral vascular disease (HCC)    Pre-diabetes     Outpatient Medications Prior to Visit  Medication Sig   acetaminophen (TYLENOL) 500 MG tablet Take 1,000 mg by mouth every 6 (six) hours as needed for moderate pain or headache.   albuterol (PROAIR HFA) 108 (90 Base) MCG/ACT inhaler Inhale 1-2 puffs into the lungs every 6 (six) hours as needed for wheezing or shortness of breath.   Alirocumab (PRALUENT) 150 MG/ML SOAJ  INJECT 1 DOSE INTO THE SKIN EVERY 14 (FOURTEEN) DAYS.   amLODipine (NORVASC) 5 MG tablet TAKE 1 TABLET (5 MG TOTAL) BY MOUTH DAILY.   ARNUITY ELLIPTA 200 MCG/ACT AEPB Inhale 1 puff into the lungs daily.   aspirin EC 81 MG tablet Take 1 tablet (81 mg total) by mouth 2 (two) times daily after a meal.   cholecalciferol (VITAMIN D3) 25 MCG (1000 UNIT) tablet Take 1,000 Units by mouth daily.   clobetasol cream (TEMOVATE) 0.05 % APPLY TO AFFECTED AREA TWICE A DAY See Dr. Durene Cal soon and discuss about further refills.   clotrimazole (LOTRIMIN AF) 1 % cream Apply 1 Application topically 2 (two) times daily.   Cyanocobalamin (B-12) 2500 MCG TABS Take 1,250 mcg by mouth daily.    diazepam (VALIUM) 5 MG tablet Take 1 tablet (5 mg total) by mouth at bedtime as needed (sleep).   EPINEPHrine 0.3 mg/0.3 mL IJ SOAJ injection Inject 0.3 mg into the muscle as needed for anaphylaxis. for allergic reaction   ezetimibe (ZETIA) 10 MG tablet TAKE 1 TABLET BY MOUTH EVERY DAY   FLOVENT HFA 110 MCG/ACT inhaler Inhale 1 puff into the lungs 2 (two) times daily.    fluticasone (FLONASE) 50 MCG/ACT nasal spray Place 1 spray into both nostrils daily.   folic acid (FOLVITE) 800 MCG tablet Take 800 mcg by mouth daily.    gabapentin (NEURONTIN) 300 MG capsule Take 2 cap in AM, 1 cap at noon, 2caps at bedtime   GEMTESA 75 MG TABS TAKE 1 TABLET BY MOUTH EVERY DAY   Glucos-Chond-Hyal Ac-Ca Fructo (MOVE FREE JOINT HEALTH ADVANCE PO) Take 1 tablet by mouth daily.   hydrochlorothiazide (HYDRODIURIL) 25 MG tablet TAKE 1 TABLET BY MOUTH EVERY DAY   ipratropium (ATROVENT HFA) 17 MCG/ACT inhaler Inhale 2 puffs into the lungs 3 (three) times daily.   ketoconazole (NIZORAL) 2 % cream Apply 1 application topically See admin instructions. Twice a day every other day   Miconazole Nitrate (LOTRIMIN AF DEODORANT POWDER) 2 % AERP Apply 1 spray topically daily as needed (jock itch).   nitroGLYCERIN (NITROSTAT) 0.4 MG SL tablet PLACE 1 TABLET UNDER THE TONGUE EVERY 5 (FIVE) MINUTES AS NEEDED.   pantoprazole (PROTONIX) 40 MG tablet TAKE 1 TABLET BY MOUTH EVERY DAY   polyethylene glycol powder (GLYCOLAX/MIRALAX) powder Take 17 g by mouth daily.   silver sulfADIAZINE (SILVADENE) 1 % cream Apply 1 Application topically daily.   traMADol (ULTRAM) 50 MG tablet Take 1 tablet (50 mg total) by mouth every 6 (six) hours as needed (no driving 6 hours after taking and do not take within 6 hours of diazepam).   valsartan (DIOVAN) 320 MG tablet TAKE 1 TABLET BY MOUTH EVERY DAY   Wheat Dextrin (BENEFIBER) POWD Take 1 Dose by mouth daily. 1 dose = 2 teaspoons   [DISCONTINUED] HYDROmorphone (DILAUDID) 2 MG tablet Take 1 tablet (2  mg total) by mouth every 4 (four) hours as needed for severe pain.   No facility-administered medications prior to visit.           Clinical Data Analysis:   Physical Exam  BP 112/64 (BP Location: Left Arm, Patient Position: Sitting)   Pulse 83   Temp 97.7 F (36.5 C) (Temporal)   Ht 5\' 6"  (1.676 m)   Wt 209 lb 9.6 oz (95.1 kg)   SpO2 96%   BMI 33.83 kg/m  Wt Readings from Last 10 Encounters:  07/29/22 209 lb 9.6 oz (95.1 kg)  07/29/22 209 lb (  94.8 kg)  07/14/22 209 lb (94.8 kg)  07/13/22 211 lb 9.6 oz (96 kg)  07/13/22 211 lb 9.6 oz (96 kg)  07/06/22 213 lb 3.2 oz (96.7 kg)  07/03/22 210 lb (95.3 kg)  06/25/22 212 lb (96.2 kg)  06/08/22 211 lb (95.7 kg)  06/04/22 212 lb (96.2 kg)   Vital signs reviewed.  Nursing notes reviewed. Weight trend reviewed. Abnormalities and Problem-Specific physical exam findings:  see problem overview images of issue General Appearance:  No acute distress appreciable.   Well-groomed, healthy-appearing male.  Well proportioned with no abnormal fat distribution.  Good muscle tone. Skin: Clear and well-hydrated. Pulmonary:  Normal work of breathing at rest, no respiratory distress apparent. SpO2: 96 %  Musculoskeletal: All extremities are intact.  Neurological:  Awake, alert, oriented, and engaged.  No obvious focal neurological deficits or cognitive impairments.  Sensorium seems unclouded.   Speech is clear and coherent with logical content. Psychiatric:  Appropriate mood, pleasant and cooperative demeanor, thoughtful and engaged during the exam   Additional Results Reviewed:     No results found for any visits on 07/29/22.  Recent Results (from the past 2160 hour(s))  Lipid panel     Status: Abnormal   Collection Time: 06/29/22  8:18 AM  Result Value Ref Range   Cholesterol, Total 115 100 - 199 mg/dL   Triglycerides 161 (H) 0 - 149 mg/dL   HDL 42 >09 mg/dL   VLDL Cholesterol Cal 27 5 - 40 mg/dL   LDL Chol Calc (NIH) 46 0 - 99 mg/dL    Chol/HDL Ratio 2.7 0.0 - 5.0 ratio    Comment:                                   T. Chol/HDL Ratio                                             Men  Women                               1/2 Avg.Risk  3.4    3.3                                   Avg.Risk  5.0    4.4                                2X Avg.Risk  9.6    7.1                                3X Avg.Risk 23.4   11.0   POCT Urinalysis Dipstick     Status: Normal   Collection Time: 07/06/22  2:15 PM  Result Value Ref Range   Color, UA yellow    Clarity, UA clear    Glucose, UA Negative Negative   Bilirubin, UA Negative    Ketones, UA Negative    Spec Grav, UA 1.015 1.010 - 1.025   Blood, UA Negative    pH, UA 6.0 5.0 - 8.0   Protein,  UA Negative Negative   Urobilinogen, UA 0.2 0.2 or 1.0 E.U./dL   Nitrite, UA Negative    Leukocytes, UA Negative Negative   Appearance     Odor      No image results found.   DG Knee AP/LAT W/Sunrise Right  Result Date: 06/28/2022 CLINICAL DATA:  right knee pain EXAM: RIGHT KNEE 3 VIEWS COMPARISON:  03/20/2020. FINDINGS: No evidence of fracture, dislocation, or joint effusion. There is tricompartmental joint space narrowing, sclerosis and osteophytes consistent with degenerative joint disease. No evidence of effusion. Calcification of menisci consistent with chondrocalcinosis. IMPRESSION: Degenerative changes. Chondrocalcinosis. No acute osseous abnormalities. Electronically Signed   By: Layla Maw M.D.   On: 06/28/2022 15:23     --------------------------------    Signed: [name], MD 07/29/2022 2:15 PM

## 2022-07-29 NOTE — Patient Instructions (Addendum)
IT band HEP  Voltaren gel over areas of pain  Keep follow up with Dr. Katrinka Blazing

## 2022-07-29 NOTE — Telephone Encounter (Signed)
Joshua [name] with [facility] requests new RX for  cephALEXin (KEFLEX) 500 MG capsule with duration -7 or 10 days?  Be sent to: [id number] [place], [place] - [address] Phone: [phone]  Fax: [phone]

## 2022-07-30 ENCOUNTER — Encounter: Payer: Self-pay | Admitting: Internal Medicine

## 2022-07-30 DIAGNOSIS — R82998 Other abnormal findings in urine: Secondary | ICD-10-CM | POA: Insufficient documentation

## 2022-07-30 LAB — CYTOLOGY, (ORAL, ANAL, URETHRAL) ANCILLARY ONLY
Chlamydia: NEGATIVE
Comment: NEGATIVE
Comment: NEGATIVE
Comment: NORMAL
Neisseria Gonorrhea: NEGATIVE
Trichomonas: NEGATIVE

## 2022-07-30 NOTE — Progress Notes (Signed)
FYI found urinary calcium oxalate and added to problems.  He's been struggling to get in with you for follow up - you might want to pass this situation along as reason to not expand panel size further.  Its a good problem to have too many patients love you so much.

## 2022-07-30 NOTE — Telephone Encounter (Signed)
See below, you sent this script in yesterday and they need clarification.

## 2022-08-02 ENCOUNTER — Encounter: Payer: Self-pay | Admitting: Internal Medicine

## 2022-08-05 ENCOUNTER — Ambulatory Visit (INDEPENDENT_AMBULATORY_CARE_PROVIDER_SITE_OTHER): Payer: PPO | Admitting: Internal Medicine

## 2022-08-05 ENCOUNTER — Encounter: Payer: Self-pay | Admitting: Internal Medicine

## 2022-08-05 VITALS — BP 112/76 | HR 65 | Temp 97.2°F | Ht 66.0 in | Wt 208.8 lb

## 2022-08-05 DIAGNOSIS — N481 Balanitis: Secondary | ICD-10-CM | POA: Diagnosis not present

## 2022-08-05 DIAGNOSIS — B379 Candidiasis, unspecified: Secondary | ICD-10-CM | POA: Diagnosis not present

## 2022-08-05 MED ORDER — NYSTATIN 100000 UNIT/GM EX OINT
1.0000 | TOPICAL_OINTMENT | Freq: Two times a day (BID) | CUTANEOUS | 3 refills | Status: DC
Start: 2022-08-05 — End: 2023-04-28

## 2022-08-05 MED ORDER — NYSTATIN 100000 UNIT/GM EX POWD: 1.0000 | Freq: Three times a day (TID) | CUTANEOUS | 11 refills | Status: DC

## 2022-08-05 NOTE — Assessment & Plan Note (Signed)
I believe that he had a secondary bacterial infection of the red skin of the left calf and that the antibiotic is cleared up pretty much at this point and that the residual tenderness is just due to persistent yeast infection secondary to urinary incontinence therefore I think the best approach at this point is maxing out the use of the nystatin powder and continuing to use barrier creams intermittently with the powder.  If the powder can be kept dry it is best to not use the barrier cream but once the powder starts to get moist go ahead and dry it off and put the barrier cream on the ultimately also added the nystatin ointment and I am thinking may be using the nystatin ointment with some of the powder mixed and will be superstrong and keep the urine out of the area where it has been contributing to the ongoing yeast infection

## 2022-08-06 NOTE — Progress Notes (Signed)
[facility]: [phone]   Routine Medical Office Visit  Patient:  Joshua [name]      Age: 82 y.o.       Sex:  male  Date:   08/05/2022 PCP:    Shelva Majestic, MD   Today's Healthcare Provider: [name], MD   Assessment and Plan:   Balanitis Assessment & Plan: I believe that he had a secondary bacterial infection of the red skin of the left calf and that the antibiotic is cleared up pretty much at this point and that the residual tenderness is just due to persistent yeast infection secondary to urinary incontinence therefore I think the best approach at this point is maxing out the use of the nystatin powder and continuing to use barrier creams intermittently with the powder.  If the powder can be kept dry it is best to not use the barrier cream but once the powder starts to get moist go ahead and dry it off and put the barrier cream on the ultimately also added the nystatin ointment and I am thinking may be using the nystatin ointment with some of the powder mixed and will be superstrong and keep the urine out of the area where it has been contributing to the ongoing yeast infection  Orders: -     Nystatin; Apply 1 Application topically 3 (three) times daily.  Dispense: 60 g; Refill: 11 -     Nystatin; Apply 1 Application topically 2 (two) times daily.  Dispense: 30 g; Refill: 3  Candidiasis -     Nystatin; Apply 1 Application topically 3 (three) times daily.  Dispense: 60 g; Refill: 11 -     Nystatin; Apply 1 Application topically 2 (two) times daily.  Dispense: 30 g; Refill: 3    Persistent difficult to resolve balanitis, due to chronic urinary incontinence. Overweight and prediabetes complicating this problem.  Today I focused on increasing availability of nystatin powder and cream which I think aggressive long term treatment(s) with these are necessary. Discontinue all antibiotic(s)- they caused side effect(s) although may have cleared up some  bacterial superinfection of the erythematous area on the shaft because its no longer tender.  Treatment plan discussed and reviewed in detail. Explained medication safety and potential side effects. Agreed on patient returning to office if symptoms worsen, persist, or new symptoms develop. Discussed precautions in case of needing to visit the Emergency Department. Answered all patient questions and confirmed understanding and comfort with the plan. Encouraged patient to contact our office if they have any questions or concerns.      Clinical Presentation:     82 y.o. male here today for One week follow-up and Medication issue (Not feeling well and leg pain. Thinks it's from the antibiotic.)  HPI  Updated chart data:  Problem  Balanitis   08/05/2022 interim history: Here reports he is taking the Keflex but it made a lot of discomfort particular in his legs and he feels that the red area on the left side of his shaft his improved a lot there is no tenderness is all well there is a little bit when he moves around but it is way better than before and regarding the dysuria or burning pain at the end of urination it is not as severe and does not last as long as it used to although not completely resolved either he reports that the powder he is having difficulty getting enough out of the small container for the nystatin  Photographs Taken 08/05/2022 :        Interim history: 07/29/22 obsessing about moisture control, thinks its improving, but still symptomatic despite using antifungal cream and barrier ointment. Photographs Taken 07/29/2022 :     Interim history: 07/13/22 he reports that the dysuria and burning with urination is way down and it still just at the tip of the penis or the urine comes out the urethral orifice.  He feels like he is getting better by 40% or so based on symptom analysis but is still very tender at the tip of the penis there is also some tenderness around the edge of the glans  where it meets the shaft Photographs Taken 07/13/22 have some white stuff he thinks is the antifungal/barrier cream.  He is been able to keep fairly dry.     Prior history: 07/06/22 82 year old male with urinary incontinence and radical prostatectomy history presents with a urethral orifice erosion and balanitis most likely from fungus due to the urinary incontinence.  In addition to creams and ointments he wants to know what else he can do to keep himself dry as he is already wearing pads  Photographs Taken 07/06/22: erosion and tenderness and skin/mucosal erosion and rash at the urethral orifice.        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   Essential hypertension 02/21/2007   Coronary atherosclerosis 10/12/2006   Asthma 10/12/2006   GERD 10/12/2006   OSTEOARTHRITIS, LOWER LEG, LEFT 10/04/2009   OSA (obstructive sleep apnea) 11/02/2006   Generalized abdominal pain 01/31/2009   History of bladder cancer 04/01/2010   Chronic bilateral low back pain with left-sided sciatica 10/09/2011   Neuropathy (HCC) 08/21/2013   Dizziness 08/23/2013   CKD (chronic kidney disease), stage III (HCC) 10/23/2013   Fatigue 11/09/2013   Allergic rhinitis 05/31/2014   Hereditary and idiopathic peripheral neuropathy 09/25/2014   Obesity, Class I, BMI 30-34.9 10/03/2015   Gout 11/20/2016   Venous insufficiency 12/29/2016   Ganglion cyst of tendon sheath of right hand 12/29/2016   Neck pain 02/08/2017   DJD (degenerative joint disease)    Arthritis    Weakness of left leg 06/21/2017   Senile purpura (HCC) 10/07/2017   Hyperglycemia 10/07/2017   Aortic atherosclerosis (HCC) 05/05/2018   Abdominal muscle strain, initial encounter 06/16/2018   Greater trochanteric bursitis of left hip 10/27/2018   Degenerative disc disease, lumbar 10/27/2018   Educated about COVID-19 virus infection 04/20/2019    Left lateral epicondylitis 09/05/2019   Overactive bladder 11/24/2019   Trigger thumb, left thumb 01/24/2020   Coronary artery disease involving coronary bypass graft of native heart without angina pectoris 04/07/2020   Pre-operative respiratory examination 06/05/2020   Primary osteoarthritis of left knee 06/18/2020   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) 09/25/2020   Bloating 12/27/2020   Hyperactive bowel sounds 01/29/2021   Greater trochanteric bursitis of right hip 02/17/2021   Degenerative cervical disc 01/12/2022   Left hamstring injury 04/21/2022   Degenerative arthritis of right knee 06/08/2022   Balanitis 07/06/2022   Urinary frequency 07/06/2022   Calcium oxalate crystals in urine 07/30/2022   Resolved Ambulatory Problems    Diagnosis Date Noted   THRUSH 05/01/2009   Acute frontal sinusitis 01/06/2010   ASTHMA UNSPECIFIED WITH EXACERBATION 04/22/2009   CONSTIPATION 01/31/2009   LOC OSTEOARTHROS NOT SPEC PRIM/SEC PELV RGN&THI 12/26/2008   OSTEOARTHROSIS NOS, UNSPECIFIED SITE 11/02/2006  HIP PAIN, RIGHT 12/26/2008   MEDIAL EPICONDYLITIS, LEFT 08/25/2007   HEEL PAIN, RIGHT 10/10/2008   SYMPTOM, MEMORY LOSS 12/14/2006   SHORTNESS OF BREATH (SOB) 05/01/2009   CHEST PAIN-UNSPECIFIED 05/01/2009   PULMONARY CONGESTION 04/12/2009   Abdominal pain, epigastric 09/14/2008   MEDIAL MENISCUS TEAR 05/05/2007   TOBACCO ABUSE, HX OF 12/26/2008   Pain in lower limb 11/07/2013   Precordial pain 02/06/2014   Neuralgia 04/10/2014   Porokeratosis 04/10/2014   Anal fissure 12/06/2014   Rectal bleeding 12/06/2014   CN (constipation) 12/06/2014   Musculoskeletal chest pain 04/24/2015   Dyslipidemia 09/23/2016   Overweight 09/23/2016   Prostate cancer (HCC) 04/23/2010   Pneumonia    Pancreatitis    Obesity    IBS (irritable bowel syndrome)    Hypertension    History of gout    History of blood transfusion 03/24/1999   GERD (gastroesophageal reflux disease)     Diverticulosis    Depression    Coronary artery disease involving native coronary artery of native heart without angina pectoris    Bladder cancer (HCC) 04/23/2010   Anemia    Acute medial meniscal tear    Exertional angina 04/18/2018   Past Medical History:  Diagnosis Date   Asthma    CAD (coronary artery disease)    H/O inguinal hernia repair 12/2018   Peripheral vascular disease (HCC)    Pre-diabetes     Outpatient Medications Prior to Visit  Medication Sig   acetaminophen (TYLENOL) 500 MG tablet Take 1,000 mg by mouth every 6 (six) hours as needed for moderate pain or headache.   albuterol (PROAIR HFA) 108 (90 Base) MCG/ACT inhaler Inhale 1-2 puffs into the lungs every 6 (six) hours as needed for wheezing or shortness of breath.   Alirocumab (PRALUENT) 150 MG/ML SOAJ INJECT 1 DOSE INTO THE SKIN EVERY 14 (FOURTEEN) DAYS.   amLODipine (NORVASC) 5 MG tablet TAKE 1 TABLET (5 MG TOTAL) BY MOUTH DAILY.   ARNUITY ELLIPTA 200 MCG/ACT AEPB Inhale 1 puff into the lungs daily.   aspirin EC 81 MG tablet Take 1 tablet (81 mg total) by mouth 2 (two) times daily after a meal.   cephALEXin (KEFLEX) 500 MG capsule Take 1 capsule (500 mg total) by mouth 3 (three) times daily for 10 days.   cholecalciferol (VITAMIN D3) 25 MCG (1000 UNIT) tablet Take 1,000 Units by mouth daily.   clobetasol cream (TEMOVATE) 0.05 % APPLY TO AFFECTED AREA TWICE A DAY See Dr. Durene Cal soon and discuss about further refills.   Cyanocobalamin (B-12) 2500 MCG TABS Take 1,250 mcg by mouth daily.   diazepam (VALIUM) 5 MG tablet Take 1 tablet (5 mg total) by mouth at bedtime as needed (sleep).   EPINEPHrine 0.3 mg/0.3 mL IJ SOAJ injection Inject 0.3 mg into the muscle as needed for anaphylaxis. for allergic reaction   ezetimibe (ZETIA) 10 MG tablet TAKE 1 TABLET BY MOUTH EVERY DAY   FLOVENT HFA 110 MCG/ACT inhaler Inhale 1 puff into the lungs 2 (two) times daily.    fluticasone (FLONASE) 50 MCG/ACT nasal spray Place 1 spray  into both nostrils daily.   folic acid (FOLVITE) 800 MCG tablet Take 800 mcg by mouth daily.    gabapentin (NEURONTIN) 300 MG capsule Take 2 cap in AM, 1 cap at noon, 2caps at bedtime   GEMTESA 75 MG TABS TAKE 1 TABLET BY MOUTH EVERY DAY   Glucos-Chond-Hyal Ac-Ca Fructo (MOVE FREE JOINT HEALTH ADVANCE PO) Take 1 tablet by mouth daily.  hydrochlorothiazide (HYDRODIURIL) 25 MG tablet TAKE 1 TABLET BY MOUTH EVERY DAY   ipratropium (ATROVENT HFA) 17 MCG/ACT inhaler Inhale 2 puffs into the lungs 3 (three) times daily.   Miconazole Nitrate (LOTRIMIN AF DEODORANT POWDER) 2 % AERP Apply 1 spray topically daily as needed (jock itch).   nitroGLYCERIN (NITROSTAT) 0.4 MG SL tablet PLACE 1 TABLET UNDER THE TONGUE EVERY 5 (FIVE) MINUTES AS NEEDED.   pantoprazole (PROTONIX) 40 MG tablet TAKE 1 TABLET BY MOUTH EVERY DAY   polyethylene glycol powder (GLYCOLAX/MIRALAX) powder Take 17 g by mouth daily.   Probiotic Product (PROBIOTIC PO) Take by mouth in the morning.   silver sulfADIAZINE (SILVADENE) 1 % cream Apply 1 Application topically daily.   traMADol (ULTRAM) 50 MG tablet Take 1 tablet (50 mg total) by mouth every 6 (six) hours as needed (no driving 6 hours after taking and do not take within 6 hours of diazepam).   valsartan (DIOVAN) 320 MG tablet TAKE 1 TABLET BY MOUTH EVERY DAY   Wheat Dextrin (BENEFIBER) POWD Take 1 Dose by mouth daily. 1 dose = 2 teaspoons   [DISCONTINUED] clotrimazole (LOTRIMIN AF) 1 % cream Apply 1 Application topically 2 (two) times daily.   [DISCONTINUED] ketoconazole (NIZORAL) 2 % cream Apply 1 application topically See admin instructions. Twice a day every other day   [DISCONTINUED] nystatin (MYCOSTATIN/NYSTOP) powder Apply 1 Application topically 3 (three) times daily.   No facility-administered medications prior to visit.         Clinical Data Analysis:    Physical Exam  BP 112/76 (BP Location: Left Arm, Patient Position: Sitting)   Pulse 65   Temp (!) 97.2 F (36.2 C)  (Temporal)   Ht 5\' 6"  (1.676 m)   Wt 208 lb 12.8 oz (94.7 kg)   SpO2 97%   BMI 33.70 kg/m  Wt Readings from Last 10 Encounters:  08/05/22 208 lb 12.8 oz (94.7 kg)  07/29/22 209 lb 9.6 oz (95.1 kg)  07/29/22 209 lb (94.8 kg)  07/14/22 209 lb (94.8 kg)  07/13/22 211 lb 9.6 oz (96 kg)  07/13/22 211 lb 9.6 oz (96 kg)  07/06/22 213 lb 3.2 oz (96.7 kg)  07/03/22 210 lb (95.3 kg)  06/25/22 212 lb (96.2 kg)  06/08/22 211 lb (95.7 kg)   Vital signs reviewed.  Nursing notes reviewed. Weight trend reviewed. Abnormalities and Problem-Specific physical exam findings:  see images for changes, truncal adiposity noted. General Appearance:  No acute distress appreciable.   Well-groomed, healthy-appearing male.  Well proportioned with no abnormal fat distribution.  Good muscle tone. Skin: Clear and well-hydrated. Pulmonary:  Normal work of breathing at rest, no respiratory distress apparent. SpO2: 97 %  Musculoskeletal: All extremities are intact.  Neurological:  Awake, alert, oriented, and engaged.  No obvious focal neurological deficits or cognitive impairments.  Sensorium seems unclouded.   Speech is clear and coherent with logical content. Psychiatric:  Appropriate mood, pleasant and cooperative demeanor, thoughtful and engaged during the exam   Additional Results Reviewed:     No results found for any visits on 08/05/22.  Recent Results (from the past 2160 hour(s))  Lipid panel     Status: Abnormal   Collection Time: 06/29/22  8:18 AM  Result Value Ref Range   Cholesterol, Total 115 100 - 199 mg/dL   Triglycerides 161 (H) 0 - 149 mg/dL   HDL 42 >09 mg/dL   VLDL Cholesterol Cal 27 5 - 40 mg/dL   LDL Chol Calc (NIH) 46 0 -  99 mg/dL   Chol/HDL Ratio 2.7 0.0 - 5.0 ratio    Comment:                                   T. Chol/HDL Ratio                                             Men  Women                               1/2 Avg.Risk  3.4    3.3                                   Avg.Risk  5.0     4.4                                2X Avg.Risk  9.6    7.1                                3X Avg.Risk 23.4   11.0   POCT Urinalysis Dipstick     Status: Normal   Collection Time: 07/06/22  2:15 PM  Result Value Ref Range   Color, UA yellow    Clarity, UA clear    Glucose, UA Negative Negative   Bilirubin, UA Negative    Ketones, UA Negative    Spec Grav, UA 1.015 1.010 - 1.025   Blood, UA Negative    pH, UA 6.0 5.0 - 8.0   Protein, UA Negative Negative   Urobilinogen, UA 0.2 0.2 or 1.0 E.U./dL   Nitrite, UA Negative    Leukocytes, UA Negative Negative   Appearance     Odor    Cytology (oral, anal, urethral) ancillary only     Status: None   Collection Time: 07/29/22  2:15 PM  Result Value Ref Range   Neisseria Gonorrhea Negative    Chlamydia Negative    Trichomonas Negative    Comment Normal Reference Ranger Chlamydia - Negative    Comment      Normal Reference Range Neisseria Gonorrhea - Negative   Comment Normal Reference Range Trichomonas - Negative   Urinalysis, Routine w reflex microscopic     Status: Abnormal   Collection Time: 07/29/22  2:19 PM  Result Value Ref Range   Color, Urine YELLOW Yellow;Lt. Yellow;Straw;Dark Yellow;Amber;Green;Red;Brown   APPearance CLEAR Clear;Turbid;Slightly Cloudy;Cloudy   Specific Gravity, Urine 1.025 1.000 - 1.030   pH 5.5 5.0 - 8.0   Total Protein, Urine NEGATIVE Negative   Urine Glucose NEGATIVE Negative   Ketones, ur NEGATIVE Negative   Bilirubin Urine NEGATIVE Negative   Hgb urine dipstick NEGATIVE Negative   Urobilinogen, UA 0.2 0.0 - 1.0   Leukocytes,Ua NEGATIVE Negative   Nitrite NEGATIVE Negative   WBC, UA 0-2/hpf 0-2/hpf   RBC / HPF 0-2/hpf 0-2/hpf   Mucus, UA Presence of (A) None   Squamous Epithelial / HPF Rare(0-4/hpf) Rare(0-4/hpf)   Hyaline Casts, UA Presence of (A) None   Ca Oxalate Crys, UA Presence of (A) None  Signed: [name], MD 08/06/2022 1:47 PM

## 2022-08-10 DIAGNOSIS — G4733 Obstructive sleep apnea (adult) (pediatric): Secondary | ICD-10-CM | POA: Diagnosis not present

## 2022-08-10 NOTE — Progress Notes (Deleted)
[name] [address] [place] [address] Phone: [phone] Subjective:    I'm seeing this patient by the request  of:  Shelva Majestic, MD  CC:   [id number]  07/24/2022 Monovisc given today  , Worsening pain at this time.  Patient did appear very uncomfortable.  Discussed that if any worsening symptoms to seek medical attention immediately.  Has had history of gout previously.  Declined any type of any laboratory history of bladder cancer but is seeing neurologist.  Once again discussed with him to be careful otherwise and if worsening symptoms seek medical attention.  Updated 08/11/2022 Joshua [name] is a 82 y.o. male coming in with complaint of knee pain  Onset-  Location Duration-  Character- Aggravating factors- Reliving factors-  Therapies tried-  Severity-     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"   CAD (coronary artery disease)    CABG 2001   Diverticulosis    DJD (degenerative joint disease)    BACK   GERD (gastroesophageal reflux disease)    H/O inguinal hernia repair 12/2018   History of gout    Hyperlipidemia    Hypertension    IBS (irritable bowel syndrome)    Obesity    OSA (obstructive sleep apnea)    USES CPAP    Pancreatitis ~ 1980   Peripheral vascular disease (HCC)    Pre-diabetes    Prostate cancer (HCC) 04/2010   Past Surgical History:  Procedure Laterality Date   ANTERIOR CERVICAL DECOMP/DISCECTOMY FUSION  05/26/2006   PLATE PLACED   BACK SURGERY     CARDIAC CATHETERIZATION  11/20/1999   CORONARY ARTERY BYPASS GRAFT  11/20/1999   SVG-RI1-RI2, SVG-OM, SVG-dRCA   CORONARY STENT INTERVENTION N/A 04/13/2019   Procedure: CORONARY STENT INTERVENTION;  Surgeon: Kathleene Hazel, MD;   Location: [facility];  Service: Cardiovascular;  Laterality: N/A;   HERNIA REPAIR     INGUINAL HERNIA REPAIR Left 01/02/2019   Procedure: OPEN LEFT INGUINAL HERNIA REPAIR WITH MESH;  Surgeon: Luretha Murphy, MD;  Location: [facility];  Service: General;  Laterality: Left;   KNEE ARTHROSCOPY Right 1992; 04/2008   LAPAROSCOPIC CHOLECYSTECTOMY  2007   LEFT HEART CATH AND CORS/GRAFTS ANGIOGRAPHY N/A 04/13/2019   Procedure: LEFT HEART CATH AND CORS/GRAFTS ANGIOGRAPHY;  Surgeon: Kathleene Hazel, MD;  Location: [facility];  Service: Cardiovascular;  Laterality: N/A;   NASAL SEPTUM SURGERY Right 2007   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   ROBOT ASSISTED LAPAROSCOPIC RADICAL PROSTATECTOMY  04/2010   THYROIDECTOMY, PARTIAL  1980   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   Procedure: LEFT TOTAL KNEE ARTHROPLASTY;  Surgeon: Marcene Corning, MD;  Location: [facility];  Service: Orthopedics;  Laterality: Left;   Social History   Socioeconomic History   Marital status: Married    Spouse name: Not on file   Number of children: Not on file   Years of education: Not on file   Highest education level: Associate degree: occupational, Scientist, product/process development, or vocational program  Occupational History   Occupation: retired    Associate Professor: RETIRED  Tobacco Use   Smoking status: Former    Packs/day: 1.00    Years: 35.00    Additional  pack years: 0.00    Total pack years: 35.00    Types: Cigarettes    Quit date: 06/16/1992    Years since quitting: 30.1   Smokeless tobacco: Never  Vaping Use   Vaping Use: Never used  Substance and Sexual Activity   Alcohol use: Yes    Alcohol/week: 2.0 standard drinks of alcohol    Types: 2 Shots of liquor per week    Comment: occ   Drug use: No   Sexual activity: Not Currently    Partners: Female  Other Topics Concern   Not on file  Social History Narrative   Married 42 years in 2015. 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      Hobbies: poker, movies and tv, staying active      Right handed   2 level home with steps he uses   Social Determinants of Corporate investment banker Strain: Low Risk  (07/10/2022)   Overall Financial Resource Strain (CARDIA)    Difficulty of Paying Living Expenses: Not hard at all  Food Insecurity: No Food Insecurity (07/10/2022)   Hunger Vital Sign    Worried About Running Out of Food in the Last Year: Never true    Ran Out of Food in the Last Year: Never true  Transportation Needs: No Transportation Needs (07/10/2022)   PRAPARE - Administrator, Civil Service (Medical): No    Lack of Transportation (Non-Medical): No  Physical Activity: Insufficiently Active (07/10/2022)   Exercise Vital Sign    Days of Exercise per Week: 1 day    Minutes of Exercise per Session: 10 min  Stress: No Stress Concern Present (07/10/2022)   [employer] of Occupational Health - Occupational Stress Questionnaire    Feeling of Stress : Only a little  Social Connections: Moderately Isolated (07/10/2022)   Social Connection and Isolation Panel [NHANES]    Frequency of Communication with Friends and Family: More than three times a week    Frequency of Social Gatherings with Friends and Family: Three times a week    Attends Religious Services: Never    Active Member of Clubs or Organizations: No    Attends Banker Meetings: Never    Marital Status: Married   Allergies  Allergen Reactions   Benadryl [Diphenhydramine] Other (See Comments)    Pt told not to take because of bypass surgery   Bromfed Nausea And Vomiting   Clarithromycin Other (See Comments)     gastritis   Codeine Other (See Comments)    Trouble breathing   Cymbalta [Duloxetine Hcl]     Abdominal pain and later chest pain as well as worsening fatigue    Doxycycline Hyclate Nausea And Vomiting   Lexapro [Escitalopram]     Dizziness   Modafinil Other (See Comments)      anxiety-nervousness   Oxycodone-Acetaminophen Itching   Promethazine Hcl Other (See Comments)     fainting   Quinolones Nausea Only    Cipro "felt real bad"   Repatha [Evolocumab]     Weak, worn out   Telithromycin Nausea And Vomiting   Tramadol Nausea Only   Hydrocodone-Acetaminophen Rash   Statins Other (See Comments)    Leg cramps and makes patient feel bad. Per pt tried multiple in years past   Sulfonamide Derivatives Rash   Family History  Problem Relation Age of Onset  Heart disease Mother    Hyperlipidemia Mother    Heart disease Father    Hyperlipidemia Father    Diabetes Brother    Joshua cancer Neg Hx    Pancreatic cancer Neg Hx    Esophageal cancer Neg Hx    Stomach cancer Neg Hx    Liver cancer Neg Hx      Current Outpatient Medications (Cardiovascular):    Alirocumab (PRALUENT) 150 MG/ML SOAJ, INJECT 1 DOSE INTO THE SKIN EVERY 14 (FOURTEEN) DAYS.   amLODipine (NORVASC) 5 MG tablet, TAKE 1 TABLET (5 MG TOTAL) BY MOUTH DAILY.   EPINEPHrine 0.3 mg/0.3 mL IJ SOAJ injection, Inject 0.3 mg into the muscle as needed for anaphylaxis. for allergic reaction   ezetimibe (ZETIA) 10 MG tablet, TAKE 1 TABLET BY MOUTH EVERY DAY   hydrochlorothiazide (HYDRODIURIL) 25 MG tablet, TAKE 1 TABLET BY MOUTH EVERY DAY   nitroGLYCERIN (NITROSTAT) 0.4 MG SL tablet, PLACE 1 TABLET UNDER THE TONGUE EVERY 5 (FIVE) MINUTES AS NEEDED.   valsartan (DIOVAN) 320 MG tablet, TAKE 1 TABLET BY MOUTH EVERY DAY  Current Outpatient Medications (Respiratory):    albuterol (PROAIR HFA) 108 (90 Base) MCG/ACT inhaler, Inhale 1-2 puffs into the lungs every 6 (six) hours as needed for wheezing or shortness of breath.   ARNUITY ELLIPTA 200 MCG/ACT AEPB, Inhale 1 puff into the lungs daily.   FLOVENT HFA 110 MCG/ACT inhaler, Inhale 1 puff into the lungs 2 (two) times daily.    fluticasone (FLONASE) 50 MCG/ACT nasal spray, Place 1 spray into both nostrils daily.   ipratropium (ATROVENT HFA) 17 MCG/ACT  inhaler, Inhale 2 puffs into the lungs 3 (three) times daily.  Current Outpatient Medications (Analgesics):    acetaminophen (TYLENOL) 500 MG tablet, Take 1,000 mg by mouth every 6 (six) hours as needed for moderate pain or headache.   aspirin EC 81 MG tablet, Take 1 tablet (81 mg total) by mouth 2 (two) times daily after a meal.   traMADol (ULTRAM) 50 MG tablet, Take 1 tablet (50 mg total) by mouth every 6 (six) hours as needed (no driving 6 hours after taking and do not take within 6 hours of diazepam).  Current Outpatient Medications (Hematological):    Cyanocobalamin (B-12) 2500 MCG TABS, Take 1,250 mcg by mouth daily.   folic acid (FOLVITE) 800 MCG tablet, Take 800 mcg by mouth daily.   Current Outpatient Medications (Other):    cholecalciferol (VITAMIN D3) 25 MCG (1000 UNIT) tablet, Take 1,000 Units by mouth daily.   clobetasol cream (TEMOVATE) 0.05 %, APPLY TO AFFECTED AREA TWICE A DAY See Dr. Durene Cal soon and discuss about further refills.   diazepam (VALIUM) 5 MG tablet, Take 1 tablet (5 mg total) by mouth at bedtime as needed (sleep).   gabapentin (NEURONTIN) 300 MG capsule, Take 2 cap in AM, 1 cap at noon, 2caps at bedtime   GEMTESA 75 MG TABS, TAKE 1 TABLET BY MOUTH EVERY DAY   Glucos-Chond-Hyal Ac-Ca Fructo (MOVE FREE JOINT HEALTH ADVANCE PO), Take 1 tablet by mouth daily.   Miconazole Nitrate (LOTRIMIN AF DEODORANT POWDER) 2 % AERP, Apply 1 spray topically daily as needed (jock itch).   nystatin (MYCOSTATIN/NYSTOP) powder, Apply 1 Application topically 3 (three) times daily.   nystatin ointment (MYCOSTATIN), Apply 1 Application topically 2 (two) times daily.   pantoprazole (PROTONIX) 40 MG tablet, TAKE 1 TABLET BY MOUTH EVERY DAY   polyethylene glycol powder (GLYCOLAX/MIRALAX) powder, Take 17 g by mouth daily.   Probiotic Product (PROBIOTIC PO),  Take by mouth in the morning.   silver sulfADIAZINE (SILVADENE) 1 % cream, Apply 1 Application topically daily.   Wheat Dextrin  (BENEFIBER) POWD, Take 1 Dose by mouth daily. 1 dose = 2 teaspoons   Reviewed prior external information including notes and imaging from  primary care provider As well as notes that were available from care everywhere and other healthcare systems.  Past medical history, social, surgical and family history all reviewed in electronic medical record.  No pertanent information unless stated regarding to the chief complaint.   Review of Systems:  No headache, visual changes, nausea, vomiting, diarrhea, constipation, dizziness, abdominal pain, skin rash, fevers, chills, night sweats, weight loss, swollen lymph nodes, body aches, joint swelling, chest pain, shortness of breath, mood changes. POSITIVE muscle aches  Objective  There were no vitals taken for this visit.   General: No apparent distress alert and oriented x3 mood and affect normal, dressed appropriately.  HEENT: Pupils equal, extraocular movements intact  Respiratory: Patient's speak in full sentences and does not appear short of breath  Cardiovascular: No lower extremity edema, non tender, no erythema      Impression and Recommendations:

## 2022-08-11 ENCOUNTER — Ambulatory Visit: Payer: PPO | Admitting: Family Medicine

## 2022-08-19 ENCOUNTER — Other Ambulatory Visit (HOSPITAL_COMMUNITY)
Admission: RE | Admit: 2022-08-19 | Discharge: 2022-08-19 | Disposition: A | Discharge status: Home / Self Care | Payer: PPO | Source: Ambulatory Visit | Attending: Internal Medicine | Admitting: Internal Medicine

## 2022-08-19 ENCOUNTER — Ambulatory Visit (INDEPENDENT_AMBULATORY_CARE_PROVIDER_SITE_OTHER): Payer: PPO | Admitting: Internal Medicine

## 2022-08-19 ENCOUNTER — Encounter: Payer: Self-pay | Admitting: Internal Medicine

## 2022-08-19 VITALS — BP 120/78 | HR 76 | Temp 97.2°F | Wt 212.6 lb

## 2022-08-19 DIAGNOSIS — R7303 Prediabetes: Secondary | ICD-10-CM | POA: Diagnosis not present

## 2022-08-19 DIAGNOSIS — R300 Dysuria: Secondary | ICD-10-CM | POA: Insufficient documentation

## 2022-08-19 DIAGNOSIS — N481 Balanitis: Secondary | ICD-10-CM

## 2022-08-19 DIAGNOSIS — N183 Chronic kidney disease, stage 3 unspecified: Secondary | ICD-10-CM | POA: Diagnosis not present

## 2022-08-19 LAB — PHOSPHORUS: Phosphorus: 3.2 mg/dL (ref 2.3–4.6)

## 2022-08-19 LAB — CBC WITH DIFFERENTIAL/PLATELET
Basophils Absolute: 0.1 10*3/uL (ref 0.0–0.1)
Basophils Relative: 0.7 % (ref 0.0–3.0)
Eosinophils Absolute: 0.3 10*3/uL (ref 0.0–0.7)
Eosinophils Relative: 3.8 % (ref 0.0–5.0)
HCT: 42.6 % (ref 39.0–52.0)
Hemoglobin: 14.4 g/dL (ref 13.0–17.0)
Lymphocytes Relative: 17.5 % (ref 12.0–46.0)
Lymphs Abs: 1.4 10*3/uL (ref 0.7–4.0)
MCHC: 33.9 g/dL (ref 30.0–36.0)
MCV: 91.5 fl (ref 78.0–100.0)
Monocytes Absolute: 0.9 10*3/uL (ref 0.1–1.0)
Monocytes Relative: 11.5 % (ref 3.0–12.0)
Neutro Abs: 5.4 10*3/uL (ref 1.4–7.7)
Neutrophils Relative %: 66.5 % (ref 43.0–77.0)
Platelets: 238 10*3/uL (ref 150.0–400.0)
RBC: 4.66 Mil/uL (ref 4.22–5.81)
RDW: 12.9 % (ref 11.5–15.5)
WBC: 8 10*3/uL (ref 4.0–10.5)

## 2022-08-19 LAB — COMPREHENSIVE METABOLIC PANEL
ALT: 18 U/L (ref 0–53)
AST: 17 U/L (ref 0–37)
Albumin: 4.1 g/dL (ref 3.5–5.2)
Alkaline Phosphatase: 62 U/L (ref 39–117)
BUN: 24 mg/dL — ABNORMAL HIGH (ref 6–23)
CO2: 31 mEq/L (ref 19–32)
Calcium: 9.6 mg/dL (ref 8.4–10.5)
Chloride: 99 mEq/L (ref 96–112)
Creatinine, Ser: 1.41 mg/dL (ref 0.40–1.50)
GFR: 47.4 mL/min — ABNORMAL LOW (ref >60.00)
Glucose, Bld: 76 mg/dL (ref 70–99)
Potassium: 4.2 mEq/L (ref 3.5–5.1)
Sodium: 136 mEq/L (ref 135–145)
Total Bilirubin: 0.7 mg/dL (ref 0.2–1.2)
Total Protein: 6.8 g/dL (ref 6.0–8.3)

## 2022-08-19 LAB — HEMOGLOBIN A1C: Hgb A1c MFr Bld: 5.9 % (ref 4.6–6.5)

## 2022-08-19 LAB — MAGNESIUM: Magnesium: 1.8 mg/dL (ref 1.5–2.5)

## 2022-08-19 LAB — URIC ACID: Uric Acid, Serum: 8.5 mg/dL — ABNORMAL HIGH (ref 4.0–7.8)

## 2022-08-19 LAB — VITAMIN D 25 HYDROXY (VIT D DEFICIENCY, FRACTURES): VITD: 52.29 ng/mL (ref 30.00–100.00)

## 2022-08-19 MED ORDER — FLUCONAZOLE 150 MG PO TABS
150.0000 mg | ORAL_TABLET | ORAL | 2 refills | Status: DC
Start: 2022-08-19 — End: 2022-12-16

## 2022-08-19 NOTE — Patient Instructions (Addendum)
It was a pleasure seeing you today! Your health and satisfaction are our top priorities.   Glenetta Hew, MD  Next Steps:  [x]  Early Intervention: Schedule sooner appointment, call our on-call services, or go to emergency room if there is Increase in pain or discomfort New or worsening symptoms Sudden or severe changes in your health [x]  Flexible Follow-Up: We recommend a Return in about 2 weeks (around 09/02/2022) for close follow up acute illness. for optimal routine care. This allows for progress monitoring and treatment adjustments. [x]  Preventive Care: Schedule your annual preventive care visit! It's typically covered by insurance and helps identify potential health issues early. [x]  Lab & X-ray Appointments: Incomplete tests scheduled today, or call to schedule. X-rays: [facility] (M-F, 8:30am-noon or 1pm-5pm). [x]  Medical Information Release: Sign a release form at front desk to obtain relevant medical information we don't have.  Making the Most of Our Focused (20 minute) Appointments:  [x]   Clearly state your top concerns at the beginning of the visit to focus our discussion [x]   If you anticipate you will need more time, please inform the front desk during scheduling - we can book multiple appointments in the same week. [x]   If you have transportation problems- use our convenient video appointments or ask about transportation support. [x]   We can get down to business faster if you use MyChart to update information before the visit and submit non-urgent questions before your visit. Thank you for taking the time to provide details through MyChart.  Let our nurse know and she can import this information into your encounter documents.  Arrival and Wait Times: [x]   Arriving on time ensures that everyone receives prompt attention. [x]   Early morning (8a) and afternoon (1p) appointments tend to have shortest wait times. [x]   Unfortunately, we cannot delay appointments for  late arrivals or hold slots during phone calls.  Getting Answers and Following Up  [x]   Simple Questions & Concerns: For quick questions or basic follow-up after your visit, reach [place] at [phone] or MyChart messaging. [x]   Complex Concerns: If your concern is more complex, scheduling an appointment might be best. Discuss this with the staff to find the most suitable option. [x]   Lab & Imaging Results: We'll contact you directly if results are abnormal or you don't use MyChart. Most normal results will be on MyChart within 2-3 business days, with a review message from Dr. Jon Billings. Haven't heard back in 2 weeks? Need results sooner? Contact us at [phone]. [x]   Referrals: Our referral coordinator will manage specialist referrals. The specialist's office should contact you within 2 weeks to schedule an appointment. Call us if you haven't heard from them after 2 weeks.  Staying Connected  [x]   MyChart: Activate your MyChart for the fastest way to access results and message [place]. See the last page of this paperwork for instructions on how to activate.  Bring to Your Next Appointment  [x]   Medications: Please bring all your medication bottles to your next appointment to ensure we have an accurate record of your prescriptions. [x]   Health Diaries: If you're monitoring any health conditions at home, keeping a diary of your readings can be very helpful for discussions at your next appointment.  Billing  [x]   X-ray & Lab Orders: These are billed by separate companies. Contact the invoicing company directly for questions or concerns. [x]   Visit Charges: Discuss any billing inquiries with our administrative services team.  Your Satisfaction Matters  [x]   Share Your Experience: We strive for your satisfaction! If you have any complaints, or preferably compliments, please let Dr. Jon Billings know directly or contact our Practice Administrators, Edwena Felty or [employer], by asking at the  front desk.   Reviewing Your Records  [x]   Review this early draft of your clinical encounter notes below and the final encounter summary tomorrow on MyChart after its been completed.   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  Dispense: 10 tablet; Refill: 2    VISIT SUMMARY:  During your visit, we discussed your ongoing infection and urinary incontiness, both of which have been issues since your prostatectomy. We also touched on your history of kidney disease and your borderline diabetes.  YOUR PLAN:  -FUNGAL INFECTION: Your infection has improved with the antifungal cream, but it seems to have spread to your leg. We will continue the cream and add a medication called Fluconazole to help fight the infection. We will also do some tests to check your kidneys and confirm the infection.  -URINARY INCONTINENCE: Your urinary issues have worsened recently. I am referring you to a urologist, a doctor who specializes in urinary problems, for further evaluation and treatment.  -GENERAL HEALTH MAINTENANCE: We will check your blood sugar levels as part of your routine labs due to your borderline diabetes.  INSTRUCTIONS:  Please continue to use the antifungal cream on the affected areas. Start taking Fluconazole 150mg  every three days, but be cautious due to your stage 3A kidney disease. We will order a complete kidney monitoring panel and a urinalysis with cytology to confirm the fungal infection. Please follow up in two  weeks to assess your response to the treatment.

## 2022-08-19 NOTE — Progress Notes (Signed)
[facility]: [phone]   Routine Medical Office Visit  Patient:  Joshua [name]      Age: 82 y.o.       Sex:  male  Date:   08/19/2022 PCP:    Shelva Majestic, MD   Today's Healthcare Provider: [name], MD   Assessment and Plan:   Assessment and Plan based on Abridge AI conversational text extraction:     Suspected Fungal Infection: Improvement noted with antifungal cream. New area of concern on the leg. Pain during urination, possibly related to long-standing post-prostatectomy complications. -Continue antifungal cream on affected areas. -Start Fluconazole 150mg  every three days, with caution due to stage 3A kidney disease. -Order complete kidney monitoring panel and urinalysis with cytology to confirm fungal infection. -Follow-up in two weeks to assess response to treatment.  Urinary Incontinence: Chronic issue since prostatectomy, with recent worsening. -Refer to urologist for further evaluation and management.  General Health Maintenance: -Check blood glucose as part of routine labs due to borderline diabetes.       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  Dispense: 10 tablet; Refill: 2    Treatment plan discussed and reviewed in detail. Explained medication safety and potential side effects.  Answered all patient questions and confirmed understanding and comfort with the plan. Encouraged patient to contact our office if they have any questions or  concerns.       Clinical Presentation:    82 y.o. male here today for 2 week follow-up, Balanitis (Has some improvement since last visit.), and Thrush  Discussed the use of AI scribe software for clinical note transcription with the patient, who gave verbal consent to proceed.  History of Present Illness  based on Abridge AI conversational text extraction:  The patient, with a history of prostatectomy, presents for evaluation of a persistent infection. He reports significant improvement in the original infection site, but notes that the infection appears to have spread to the leg. The patient has been treating the infection with antifungal cream, which he believes has been effective in reducing discomfort and improving the condition.  He also reports chronic urinary incontinence since his prostatectomy, which had initially resolved post-surgery but has since returned. He describes a severe, non-burning pain in the entire penis during urination, likening it to the sensation of being hit with a hammer. This pain has been present since the prostatectomy and has been a consistent issue.  He also recounts an incident post-knee surgery where a catheterization was attempted but was unsuccessful due to an unidentified blockage. A urologist was able to perform the catheterization later that day. The patient has had ongoing difficulties with urination since the prostatectomy.  He has an upcoming appointment with a new urologist and has been seen by multiple urologists in the past. He expresses dissatisfaction  with previous urologists and hopes for better care with the new specialist.  He also mentions a history of kidney disease, which is being monitored by his primary care physician. He reports a poor diet due to dietary restrictions and expresses a desire to adopt a vegetarian diet if possible. He also mentions a family history of diabetes and is aware he is borderline diabetic.  The patient's spouse is  also mentioned, who is dealing with chronic obstructive pulmonary disease (COPD) and is having difficulties with her current treatment.      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   Essential hypertension 02/21/2007   Coronary atherosclerosis 10/12/2006   Asthma 10/12/2006   GERD 10/12/2006   OSTEOARTHRITIS, LOWER LEG, LEFT 10/04/2009   OSA (obstructive sleep apnea) 11/02/2006   Generalized abdominal pain 01/31/2009   History of bladder cancer 04/01/2010   Chronic bilateral low back pain with left-sided sciatica 10/09/2011   Neuropathy (HCC) 08/21/2013   Dizziness 08/23/2013   CKD (chronic kidney disease), stage III (HCC) 10/23/2013   Fatigue 11/09/2013   Allergic rhinitis 05/31/2014   Hereditary and idiopathic peripheral neuropathy 09/25/2014   Obesity, Class I, BMI 30-34.9 10/03/2015   Gout 11/20/2016   Venous insufficiency 12/29/2016   Ganglion cyst of tendon sheath of right hand 12/29/2016   Neck pain 02/08/2017   DJD (degenerative joint disease)    Arthritis    Weakness of left leg 06/21/2017   Senile purpura (HCC) 10/07/2017   Hyperglycemia 10/07/2017   Aortic atherosclerosis (HCC) 05/05/2018   Abdominal muscle strain, initial encounter 06/16/2018   Greater trochanteric bursitis of left hip 10/27/2018   Degenerative disc disease, lumbar 10/27/2018   Educated about COVID-19 virus infection 04/20/2019   Left lateral epicondylitis 09/05/2019   Overactive bladder 11/24/2019   Trigger thumb, left thumb 01/24/2020   Coronary artery disease involving coronary bypass graft of native heart without angina pectoris 04/07/2020   Pre-operative respiratory examination 06/05/2020   Primary osteoarthritis of left knee 06/18/2020   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) 09/25/2020   Bloating 12/27/2020   Hyperactive bowel sounds 01/29/2021   Greater trochanteric bursitis  of right hip 02/17/2021   Degenerative cervical disc 01/12/2022   Left hamstring injury 04/21/2022   Degenerative arthritis of right knee 06/08/2022   Balanitis 07/06/2022   Urinary frequency 07/06/2022   Calcium oxalate crystals in urine 07/30/2022   Resolved Ambulatory Problems    Diagnosis Date Noted   THRUSH 05/01/2009   Acute frontal sinusitis 01/06/2010   ASTHMA UNSPECIFIED WITH EXACERBATION 04/22/2009   CONSTIPATION 01/31/2009   LOC OSTEOARTHROS NOT SPEC PRIM/SEC PELV RGN&THI 12/26/2008   OSTEOARTHROSIS NOS, UNSPECIFIED SITE 11/02/2006   HIP PAIN, RIGHT 12/26/2008   MEDIAL EPICONDYLITIS, LEFT 08/25/2007   HEEL PAIN, RIGHT 10/10/2008   SYMPTOM, MEMORY LOSS 12/14/2006   SHORTNESS OF BREATH (SOB) 05/01/2009   CHEST PAIN-UNSPECIFIED 05/01/2009   PULMONARY CONGESTION 04/12/2009   Abdominal pain, epigastric 09/14/2008   MEDIAL MENISCUS TEAR 05/05/2007   TOBACCO ABUSE, HX OF 12/26/2008   Pain in lower limb 11/07/2013   Precordial pain 02/06/2014   Neuralgia 04/10/2014   Porokeratosis 04/10/2014   Anal fissure 12/06/2014   Rectal bleeding 12/06/2014   CN (constipation) 12/06/2014   Musculoskeletal chest pain 04/24/2015   Dyslipidemia 09/23/2016   Overweight 09/23/2016   Prostate cancer (HCC) 04/23/2010   Pneumonia    Pancreatitis    Obesity  IBS (irritable bowel syndrome)    Hypertension    History of gout    History of blood transfusion 03/24/1999   GERD (gastroesophageal reflux disease)    Diverticulosis    Depression    Coronary artery disease involving native coronary artery of native heart without angina pectoris    Bladder cancer (HCC) 04/23/2010   Anemia    Acute medial meniscal tear    Exertional angina 04/18/2018   Past Medical History:  Diagnosis Date   Asthma    CAD (coronary artery disease)    H/O inguinal hernia repair 12/2018   Peripheral vascular disease (HCC)    Pre-diabetes     Outpatient Medications Prior to Visit  Medication Sig    acetaminophen (TYLENOL) 500 MG tablet Take 1,000 mg by mouth every 6 (six) hours as needed for moderate pain or headache.   albuterol (PROAIR HFA) 108 (90 Base) MCG/ACT inhaler Inhale 1-2 puffs into the lungs every 6 (six) hours as needed for wheezing or shortness of breath.   Alirocumab (PRALUENT) 150 MG/ML SOAJ INJECT 1 DOSE INTO THE SKIN EVERY 14 (FOURTEEN) DAYS.   amLODipine (NORVASC) 5 MG tablet TAKE 1 TABLET (5 MG TOTAL) BY MOUTH DAILY.   ARNUITY ELLIPTA 200 MCG/ACT AEPB Inhale 1 puff into the lungs daily.   aspirin EC 81 MG tablet Take 1 tablet (81 mg total) by mouth 2 (two) times daily after a meal.   cholecalciferol (VITAMIN D3) 25 MCG (1000 UNIT) tablet Take 1,000 Units by mouth daily.   clobetasol cream (TEMOVATE) 0.05 % APPLY TO AFFECTED AREA TWICE A DAY See Dr. Durene Cal soon and discuss about further refills.   Cyanocobalamin (B-12) 2500 MCG TABS Take 1,250 mcg by mouth daily.   diazepam (VALIUM) 5 MG tablet Take 1 tablet (5 mg total) by mouth at bedtime as needed (sleep).   EPINEPHrine 0.3 mg/0.3 mL IJ SOAJ injection Inject 0.3 mg into the muscle as needed for anaphylaxis. for allergic reaction   ezetimibe (ZETIA) 10 MG tablet TAKE 1 TABLET BY MOUTH EVERY DAY   fluticasone (FLONASE) 50 MCG/ACT nasal spray Place 1 spray into both nostrils daily.   folic acid (FOLVITE) 800 MCG tablet Take 800 mcg by mouth daily.    gabapentin (NEURONTIN) 300 MG capsule Take 2 cap in AM, 1 cap at noon, 2caps at bedtime   GEMTESA 75 MG TABS TAKE 1 TABLET BY MOUTH EVERY DAY   Glucos-Chond-Hyal Ac-Ca Fructo (MOVE FREE JOINT HEALTH ADVANCE PO) Take 1 tablet by mouth daily.   hydrochlorothiazide (HYDRODIURIL) 25 MG tablet TAKE 1 TABLET BY MOUTH EVERY DAY   ipratropium (ATROVENT HFA) 17 MCG/ACT inhaler Inhale 2 puffs into the lungs 3 (three) times daily.   Miconazole Nitrate (LOTRIMIN AF DEODORANT POWDER) 2 % AERP Apply 1 spray topically daily as needed (jock itch).   nitroGLYCERIN (NITROSTAT) 0.4 MG SL tablet  PLACE 1 TABLET UNDER THE TONGUE EVERY 5 (FIVE) MINUTES AS NEEDED.   nystatin (MYCOSTATIN/NYSTOP) powder Apply 1 Application topically 3 (three) times daily.   nystatin ointment (MYCOSTATIN) Apply 1 Application topically 2 (two) times daily.   pantoprazole (PROTONIX) 40 MG tablet TAKE 1 TABLET BY MOUTH EVERY DAY   polyethylene glycol powder (GLYCOLAX/MIRALAX) powder Take 17 g by mouth daily.   Probiotic Product (PROBIOTIC PO) Take by mouth in the morning.   silver sulfADIAZINE (SILVADENE) 1 % cream Apply 1 Application topically daily.   traMADol (ULTRAM) 50 MG tablet Take 1 tablet (50 mg total) by mouth every 6 (six)  hours as needed (no driving 6 hours after taking and do not take within 6 hours of diazepam).   valsartan (DIOVAN) 320 MG tablet TAKE 1 TABLET BY MOUTH EVERY DAY   Wheat Dextrin (BENEFIBER) POWD Take 1 Dose by mouth daily. 1 dose = 2 teaspoons   [DISCONTINUED] FLOVENT HFA 110 MCG/ACT inhaler Inhale 1 puff into the lungs 2 (two) times daily.    No facility-administered medications prior to visit.         Clinical Data Analysis:   Physical Exam  BP 120/78 (BP Location: Left Arm, Patient Position: Sitting)   Pulse 76   Temp (!) 97.2 F (36.2 C) (Temporal)   Wt 212 lb 9.6 oz (96.4 kg)   SpO2 97%   BMI 34.31 kg/m  Wt Readings from Last 10 Encounters:  08/19/22 212 lb 9.6 oz (96.4 kg)  08/05/22 208 lb 12.8 oz (94.7 kg)  07/29/22 209 lb 9.6 oz (95.1 kg)  07/29/22 209 lb (94.8 kg)  07/14/22 209 lb (94.8 kg)  07/13/22 211 lb 9.6 oz (96 kg)  07/13/22 211 lb 9.6 oz (96 kg)  07/06/22 213 lb 3.2 oz (96.7 kg)  07/03/22 210 lb (95.3 kg)  06/25/22 212 lb (96.2 kg)   Vital signs reviewed.  Nursing notes reviewed. Weight trend reviewed. Abnormalities and Problem-Specific physical exam findings:  minimal change to urethral orifice fungal type rash - now also spread onto left inner thigh. General Appearance:  No acute distress appreciable.   Well-groomed, healthy-appearing male.  Well  proportioned with no abnormal fat distribution.  Good muscle tone. Skin: Clear and well-hydrated. Pulmonary:  Normal work of breathing at rest, no respiratory distress apparent. SpO2: 97 %  Musculoskeletal: All extremities are intact.  Neurological:  Awake, alert, oriented, and engaged.  No obvious focal neurological deficits or cognitive impairments.  Sensorium seems unclouded.   Speech is clear and coherent with logical content. Psychiatric:  Appropriate mood, pleasant and cooperative demeanor, thoughtful and engaged during the exam  Results Reviewed:    Results for orders placed or performed in visit on 08/19/22  Comp Met (CMET)  Result Value Ref Range   Sodium 136 135 - 145 mEq/L   Potassium 4.2 3.5 - 5.1 mEq/L   Chloride 99 96 - 112 mEq/L   CO2 31 19 - 32 mEq/L   Glucose, Bld 76 70 - 99 mg/dL   BUN 24 (H) 6 - 23 mg/dL   Creatinine, Ser 1.61 0.40 - 1.50 mg/dL   Total Bilirubin 0.7 0.2 - 1.2 mg/dL   Alkaline Phosphatase 62 39 - 117 U/L   AST 17 0 - 37 U/L   ALT 18 0 - 53 U/L   Total Protein 6.8 6.0 - 8.3 g/dL   Albumin 4.1 3.5 - 5.2 g/dL   GFR 09.60 (L) >45.40 mL/min   Calcium 9.6 8.4 - 10.5 mg/dL  Phosphorus  Result Value Ref Range   Phosphorus 3.2 2.3 - 4.6 mg/dL  Magnesium  Result Value Ref Range   Magnesium 1.8 1.5 - 2.5 mg/dL  Uric acid  Result Value Ref Range   Uric Acid, Serum 8.5 (H) 4.0 - 7.8 mg/dL  Vitamin D (25 hydroxy)  Result Value Ref Range   VITD 52.29 30.00 - 100.00 ng/mL  HgB A1c  Result Value Ref Range   Hgb A1c MFr Bld 5.9 4.6 - 6.5 %  CBC with Differential/Platelet  Result Value Ref Range   WBC 8.0 4.0 - 10.5 K/uL   RBC 4.66 4.22 -  5.81 Mil/uL   Hemoglobin 14.4 13.0 - 17.0 g/dL   HCT 40.9 81.1 - 91.4 %   MCV 91.5 78.0 - 100.0 fl   MCHC 33.9 30.0 - 36.0 g/dL   RDW 78.2 95.6 - 21.3 %   Platelets 238.0 150.0 - 400.0 K/uL   Neutrophils Relative % 66.5 43.0 - 77.0 %   Lymphocytes Relative 17.5 12.0 - 46.0 %   Monocytes Relative 11.5 3.0 - 12.0  %   Eosinophils Relative 3.8 0.0 - 5.0 %   Basophils Relative 0.7 0.0 - 3.0 %   Neutro Abs 5.4 1.4 - 7.7 K/uL   Lymphs Abs 1.4 0.7 - 4.0 K/uL   Monocytes Absolute 0.9 0.1 - 1.0 K/uL   Eosinophils Absolute 0.3 0.0 - 0.7 K/uL   Basophils Absolute 0.1 0.0 - 0.1 K/uL    Recent Results (from the past 2160 hour(s))  Lipid panel     Status: Abnormal   Collection Time: 06/29/22  8:18 AM  Result Value Ref Range   Cholesterol, Total 115 100 - 199 mg/dL   Triglycerides 086 (H) 0 - 149 mg/dL   HDL 42 >57 mg/dL   VLDL Cholesterol Cal 27 5 - 40 mg/dL   LDL Chol Calc (NIH) 46 0 - 99 mg/dL   Chol/HDL Ratio 2.7 0.0 - 5.0 ratio    Comment:                                   T. Chol/HDL Ratio                                             Men  Women                               1/2 Avg.Risk  3.4    3.3                                   Avg.Risk  5.0    4.4                                2X Avg.Risk  9.6    7.1                                3X Avg.Risk 23.4   11.0   POCT Urinalysis Dipstick     Status: Normal   Collection Time: 07/06/22  2:15 PM  Result Value Ref Range   Color, UA yellow    Clarity, UA clear    Glucose, UA Negative Negative   Bilirubin, UA Negative    Ketones, UA Negative    Spec Grav, UA 1.015 1.010 - 1.025   Blood, UA Negative    pH, UA 6.0 5.0 - 8.0   Protein, UA Negative Negative   Urobilinogen, UA 0.2 0.2 or 1.0 E.U./dL   Nitrite, UA Negative    Leukocytes, UA Negative Negative   Appearance     Odor    Cytology (oral, anal, urethral) ancillary only     Status: None  Collection Time: 07/29/22  2:15 PM  Result Value Ref Range   Neisseria Gonorrhea Negative    Chlamydia Negative    Trichomonas Negative    Comment Normal Reference Ranger Chlamydia - Negative    Comment      Normal Reference Range Neisseria Gonorrhea - Negative   Comment Normal Reference Range Trichomonas - Negative   Urinalysis, Routine w reflex microscopic     Status: Abnormal   Collection Time:  07/29/22  2:19 PM  Result Value Ref Range   Color, Urine YELLOW Yellow;Lt. Yellow;Straw;Dark Yellow;Amber;Green;Red;Brown   APPearance CLEAR Clear;Turbid;Slightly Cloudy;Cloudy   Specific Gravity, Urine 1.025 1.000 - 1.030   pH 5.5 5.0 - 8.0   Total Protein, Urine NEGATIVE Negative   Urine Glucose NEGATIVE Negative   Ketones, ur NEGATIVE Negative   Bilirubin Urine NEGATIVE Negative   Hgb urine dipstick NEGATIVE Negative   Urobilinogen, UA 0.2 0.0 - 1.0   Leukocytes,Ua NEGATIVE Negative   Nitrite NEGATIVE Negative   WBC, UA 0-2/hpf 0-2/hpf   RBC / HPF 0-2/hpf 0-2/hpf   Mucus, UA Presence of (A) None   Squamous Epithelial / HPF Rare(0-4/hpf) Rare(0-4/hpf)   Hyaline Casts, UA Presence of (A) None   Ca Oxalate Crys, UA Presence of (A) None  Comp Met (CMET)     Status: Abnormal   Collection Time: 08/19/22  9:24 AM  Result Value Ref Range   Sodium 136 135 - 145 mEq/L   Potassium 4.2 3.5 - 5.1 mEq/L   Chloride 99 96 - 112 mEq/L   CO2 31 19 - 32 mEq/L   Glucose, Bld 76 70 - 99 mg/dL   BUN 24 (H) 6 - 23 mg/dL   Creatinine, Ser 4.09 0.40 - 1.50 mg/dL   Total Bilirubin 0.7 0.2 - 1.2 mg/dL   Alkaline Phosphatase 62 39 - 117 U/L   AST 17 0 - 37 U/L   ALT 18 0 - 53 U/L   Total Protein 6.8 6.0 - 8.3 g/dL   Albumin 4.1 3.5 - 5.2 g/dL   GFR 81.19 (L) >14.78 mL/min    Comment: Calculated using the CKD-EPI Creatinine Equation (2021)   Calcium 9.6 8.4 - 10.5 mg/dL  Phosphorus     Status: None   Collection Time: 08/19/22  9:24 AM  Result Value Ref Range   Phosphorus 3.2 2.3 - 4.6 mg/dL  Magnesium     Status: None   Collection Time: 08/19/22  9:24 AM  Result Value Ref Range   Magnesium 1.8 1.5 - 2.5 mg/dL  Uric acid     Status: Abnormal   Collection Time: 08/19/22  9:24 AM  Result Value Ref Range   Uric Acid, Serum 8.5 (H) 4.0 - 7.8 mg/dL  Vitamin D (25 hydroxy)     Status: None   Collection Time: 08/19/22  9:24 AM  Result Value Ref Range   VITD 52.29 30.00 - 100.00 ng/mL  HgB A1c      Status: None   Collection Time: 08/19/22  9:24 AM  Result Value Ref Range   Hgb A1c MFr Bld 5.9 4.6 - 6.5 %    Comment: Glycemic Control Guidelines for People with Diabetes:Non Diabetic:  <6%Goal of Therapy: <7%Additional Action Suggested:  >8%   CBC with Differential/Platelet     Status: None   Collection Time: 08/19/22  9:24 AM  Result Value Ref Range   WBC 8.0 4.0 - 10.5 K/uL   RBC 4.66 4.22 - 5.81 Mil/uL   Hemoglobin 14.4 13.0 - 17.0  g/dL   HCT 16.1 09.6 - 04.5 %   MCV 91.5 78.0 - 100.0 fl   MCHC 33.9 30.0 - 36.0 g/dL   RDW 40.9 81.1 - 91.4 %   Platelets 238.0 150.0 - 400.0 K/uL   Neutrophils Relative % 66.5 43.0 - 77.0 %   Lymphocytes Relative 17.5 12.0 - 46.0 %   Monocytes Relative 11.5 3.0 - 12.0 %   Eosinophils Relative 3.8 0.0 - 5.0 %   Basophils Relative 0.7 0.0 - 3.0 %   Neutro Abs 5.4 1.4 - 7.7 K/uL   Lymphs Abs 1.4 0.7 - 4.0 K/uL   Monocytes Absolute 0.9 0.1 - 1.0 K/uL   Eosinophils Absolute 0.3 0.0 - 0.7 K/uL   Basophils Absolute 0.1 0.0 - 0.1 K/uL    No image results found.   DG Knee AP/LAT W/Sunrise Right  Result Date: 06/28/2022 CLINICAL DATA:  right knee pain EXAM: RIGHT KNEE 3 VIEWS COMPARISON:  03/20/2020. FINDINGS: No evidence of fracture, dislocation, or joint effusion. There is tricompartmental joint space narrowing, sclerosis and osteophytes consistent with degenerative joint disease. No evidence of effusion. Calcification of menisci consistent with chondrocalcinosis. IMPRESSION: Degenerative changes. Chondrocalcinosis. No acute osseous abnormalities. Electronically Signed   By: Layla Maw M.D.   On: 06/28/2022 15:23       Signed: [name], MD 08/19/2022 6:24 PM

## 2022-08-20 LAB — URINE CYTOLOGY ANCILLARY ONLY
Bacterial Vaginitis-Urine: NEGATIVE
Candida Urine: NEGATIVE
Chlamydia: NEGATIVE
Comment: NEGATIVE
Comment: NEGATIVE
Comment: NORMAL
Neisseria Gonorrhea: NEGATIVE
Trichomonas: NEGATIVE

## 2022-08-20 LAB — URINALYSIS, COMPLETE
Bacteria, UA: NONE SEEN /HPF
Bilirubin Urine: NEGATIVE
Glucose, UA: NEGATIVE
Hgb urine dipstick: NEGATIVE
Hyaline Cast: NONE SEEN /LPF
Ketones, ur: NEGATIVE
Leukocytes,Ua: NEGATIVE
Nitrite: NEGATIVE
Protein, ur: NEGATIVE
RBC / HPF: NONE SEEN /HPF (ref 0–2)
Specific Gravity, Urine: 1.012 (ref 1.001–1.035)
Squamous Epithelial / HPF: NONE SEEN /HPF (ref <=5)
WBC, UA: NONE SEEN /HPF (ref 0–5)
pH: 6 (ref 5.0–8.0)

## 2022-08-22 ENCOUNTER — Encounter: Payer: Self-pay | Admitting: Internal Medicine

## 2022-08-24 NOTE — Telephone Encounter (Signed)
Please see patient message and advise.

## 2022-08-25 ENCOUNTER — Other Ambulatory Visit: Payer: Self-pay | Admitting: Internal Medicine

## 2022-08-25 DIAGNOSIS — D1801 Hemangioma of skin and subcutaneous tissue: Secondary | ICD-10-CM | POA: Diagnosis not present

## 2022-08-25 DIAGNOSIS — D225 Melanocytic nevi of trunk: Secondary | ICD-10-CM | POA: Diagnosis not present

## 2022-08-25 DIAGNOSIS — D692 Other nonthrombocytopenic purpura: Secondary | ICD-10-CM | POA: Diagnosis not present

## 2022-08-25 DIAGNOSIS — D2372 Other benign neoplasm of skin of left lower limb, including hip: Secondary | ICD-10-CM | POA: Diagnosis not present

## 2022-08-25 DIAGNOSIS — M10071 Idiopathic gout, right ankle and foot: Secondary | ICD-10-CM

## 2022-08-25 DIAGNOSIS — L821 Other seborrheic keratosis: Secondary | ICD-10-CM | POA: Diagnosis not present

## 2022-08-25 DIAGNOSIS — L570 Actinic keratosis: Secondary | ICD-10-CM | POA: Diagnosis not present

## 2022-08-25 MED ORDER — ALLOPURINOL 100 MG PO TABS
100.0000 mg | ORAL_TABLET | Freq: Every day | ORAL | 3 refills | Status: DC
Start: 2022-08-25 — End: 2023-02-11

## 2022-08-28 ENCOUNTER — Telehealth: Payer: Self-pay | Admitting: Pharmacist

## 2022-08-28 NOTE — Progress Notes (Signed)
Care Management & Coordination Services Pharmacy Team  Reason for Encounter: Appointment Reminder  Contacted patient to confirm telephone appointment with Erskine Emery, PharmD on  08/31/2022 at 2:30 pm. Unsuccessful outreach. Left voicemail with appointment details.   Star Rating Drugs:  Valsartan 320 mg last filled 08/06/2022 90 DS   Care Gaps: Annual wellness visit in last year? Yes  Future Appointments  Date Time Provider Department Center  08/31/2022  2:30 PM Erroll [name], [place] [facility] None  09/02/2022  9:00 AM [name], MD [facility]  09/14/2022  9:30 AM Judi Saa, DO [facility] None  09/18/2022  8:00 AM Shelva Majestic, MD [facility]  10/13/2022  8:20 AM Shelva Majestic, MD [facility]  04/28/2023  8:30 AM Van Clines, MD [facility] None  06/10/2023 10:00 AM [facility] VISIT 1 [facility]   April D Calhoun, [facility] Clinical Pharmacist Assistant [phone]

## 2022-08-31 ENCOUNTER — Encounter: Payer: PPO | Admitting: Pharmacist

## 2022-09-02 ENCOUNTER — Encounter: Payer: Self-pay | Admitting: Internal Medicine

## 2022-09-02 ENCOUNTER — Ambulatory Visit (INDEPENDENT_AMBULATORY_CARE_PROVIDER_SITE_OTHER): Payer: PPO | Admitting: Internal Medicine

## 2022-09-02 ENCOUNTER — Ambulatory Visit (INDEPENDENT_AMBULATORY_CARE_PROVIDER_SITE_OTHER)
Admission: RE | Admit: 2022-09-02 | Discharge: 2022-09-02 | Disposition: A | Discharge status: Home / Self Care | Payer: PPO | Source: Ambulatory Visit | Attending: Internal Medicine | Admitting: Internal Medicine

## 2022-09-02 VITALS — BP 104/62 | HR 52 | Temp 97.2°F | Ht 66.0 in | Wt 209.8 lb

## 2022-09-02 DIAGNOSIS — R051 Acute cough: Secondary | ICD-10-CM | POA: Diagnosis not present

## 2022-09-02 DIAGNOSIS — R5383 Other fatigue: Secondary | ICD-10-CM | POA: Diagnosis not present

## 2022-09-02 DIAGNOSIS — T17908A Unspecified foreign body in respiratory tract, part unspecified causing other injury, initial encounter: Secondary | ICD-10-CM | POA: Diagnosis not present

## 2022-09-02 DIAGNOSIS — G4459 Other complicated headache syndrome: Secondary | ICD-10-CM

## 2022-09-02 DIAGNOSIS — T17320A Food in larynx causing asphyxiation, initial encounter: Secondary | ICD-10-CM | POA: Diagnosis not present

## 2022-09-02 DIAGNOSIS — N481 Balanitis: Secondary | ICD-10-CM

## 2022-09-02 LAB — COMPREHENSIVE METABOLIC PANEL
ALT: 15 U/L (ref 0–53)
AST: 14 U/L (ref 0–37)
Albumin: 4 g/dL (ref 3.5–5.2)
Alkaline Phosphatase: 57 U/L (ref 39–117)
BUN: 21 mg/dL (ref 6–23)
CO2: 28 mEq/L (ref 19–32)
Calcium: 9.3 mg/dL (ref 8.4–10.5)
Chloride: 100 mEq/L (ref 96–112)
Creatinine, Ser: 1.46 mg/dL (ref 0.40–1.50)
GFR: 45.44 mL/min — ABNORMAL LOW (ref >60.00)
Glucose, Bld: 85 mg/dL (ref 70–99)
Potassium: 4.3 mEq/L (ref 3.5–5.1)
Sodium: 134 mEq/L — ABNORMAL LOW (ref 135–145)
Total Bilirubin: 0.7 mg/dL (ref 0.2–1.2)
Total Protein: 6.4 g/dL (ref 6.0–8.3)

## 2022-09-02 LAB — CBC WITH DIFFERENTIAL/PLATELET
Basophils Absolute: 0 10*3/uL (ref 0.0–0.1)
Basophils Relative: 0.6 % (ref 0.0–3.0)
Eosinophils Absolute: 0.3 10*3/uL (ref 0.0–0.7)
Eosinophils Relative: 3.5 % (ref 0.0–5.0)
HCT: 41 % (ref 39.0–52.0)
Hemoglobin: 13.8 g/dL (ref 13.0–17.0)
Lymphocytes Relative: 15.4 % (ref 12.0–46.0)
Lymphs Abs: 1.2 10*3/uL (ref 0.7–4.0)
MCHC: 33.7 g/dL (ref 30.0–36.0)
MCV: 92.2 fl (ref 78.0–100.0)
Monocytes Absolute: 0.8 10*3/uL (ref 0.1–1.0)
Monocytes Relative: 10.4 % (ref 3.0–12.0)
Neutro Abs: 5.3 10*3/uL (ref 1.4–7.7)
Neutrophils Relative %: 70.1 % (ref 43.0–77.0)
Platelets: 225 10*3/uL (ref 150.0–400.0)
RBC: 4.45 Mil/uL (ref 4.22–5.81)
RDW: 13 % (ref 11.5–15.5)
WBC: 7.5 10*3/uL (ref 4.0–10.5)

## 2022-09-02 NOTE — Patient Instructions (Signed)
It was a pleasure seeing you today! Your health and satisfaction are our top priorities.   Glenetta Hew, MD  VISIT SUMMARY:  During our visit, we discussed your recent incident of accidentally inhaling a pill and the symptoms you've been experiencing since, including a persistent cough, fatigue, and a worsening headache. We also discussed your history of balanitis and the improvement you've seen with your current medication.  YOUR PLAN:  -FOREIGN BODY ASPIRATION: This is when an object, in your case a pill, is accidentally inhaled into the lungs. We're concerned this may have led to your symptoms and potentially pneumonia. We've ordered a chest x-ray and blood work to check for any signs of infection or liver damage from your antifungal medication. We also recommend taking long, hot showers for steam treatments to help dissolve the pill in your lungs.  Get labs prior to leaving and remain available for MyChart messages in case we find anything we need to act on.  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 You've seen improvement with your current medication, so we'll continue with this treatment.  INSTRUCTIONS:  We will follow up with you once we have the results of your chest x-ray and blood work. In the meantime, please continue with your current balanitis medication and take long, hot showers to help dissolve the inhaled pill.   Next Steps:  [x]  Early Intervention: Schedule sooner appointment, call our on-call services, or go to emergency room if there is Increase in pain or discomfort New or worsening symptoms Sudden or severe changes in your health [x]  Flexible Follow-Up: We recommend a No follow-ups on file. for optimal routine care. This allows for progress monitoring and treatment adjustments. [x]  Preventive Care: Schedule your annual preventive care visit! It's typically covered by insurance and helps identify potential health issues early. [x]  Lab & X-ray  Appointments: Incomplete tests scheduled today, or call to schedule. X-rays: [facility] (M-F, 8:30am-noon or 1pm-5pm). [x]  Medical Information Release: Sign a release form at front desk to obtain relevant medical information we don't have.  Making the Most of Our Focused (20 minute) Appointments:  [x]   Clearly state your top concerns at the beginning of the visit to focus our discussion [x]   If you anticipate you will need more time, please inform the front desk during scheduling - we can book multiple appointments in the same week. [x]   If you have transportation problems- use our convenient video appointments or ask about transportation support. [x]   We can get down to business faster if you use MyChart to update information before the visit and submit non-urgent questions before your visit. Thank you for taking the time to provide details through MyChart.  Let our nurse know and she can import this information into your encounter documents.  Arrival and Wait Times: [x]   Arriving on time ensures that everyone receives prompt attention. [x]   Early morning (8a) and afternoon (1p) appointments tend to have shortest wait times. [x]   Unfortunately, we cannot delay appointments for late arrivals or hold slots during phone calls.  Getting Answers and Following Up  [x]   Simple Questions & Concerns: For quick questions or basic follow-up after your visit, reach [place] at [phone] or MyChart messaging. [x]   Complex Concerns: If your concern is more complex, scheduling an appointment might be best. Discuss this with the staff to find the most suitable option. [x]   Lab & Imaging Results: We'll contact you directly if results are abnormal or  you don't use MyChart. Most normal results will be on MyChart within 2-3 business days, with a review message from Dr. Jon Billings. Haven't heard back in 2 weeks? Need results sooner? Contact us at [phone]. [x]   Referrals: Our referral  coordinator will manage specialist referrals. The specialist's office should contact you within 2 weeks to schedule an appointment. Call us if you haven't heard from them after 2 weeks.  Staying Connected  [x]   MyChart: Activate your MyChart for the fastest way to access results and message [place]. See the last page of this paperwork for instructions on how to activate.  Bring to Your Next Appointment  [x]   Medications: Please bring all your medication bottles to your next appointment to ensure we have an accurate record of your prescriptions. [x]   Health Diaries: If you're monitoring any health conditions at home, keeping a diary of your readings can be very helpful for discussions at your next appointment.  Billing  [x]   X-ray & Lab Orders: These are billed by separate companies. Contact the invoicing company directly for questions or concerns. [x]   Visit Charges: Discuss any billing inquiries with our administrative services team.  Your Satisfaction Matters  [x]   Share Your Experience: We strive for your satisfaction! If you have any complaints, or preferably compliments, please let Dr. Jon Billings know directly or contact our Practice Administrators, Edwena Felty or [employer], by asking at the front desk.   Reviewing Your Records  [x]   Review this early draft of your clinical encounter notes below and the final encounter summary tomorrow on MyChart after its been completed.   Inhaled foreign object, initial encounter  Balanitis Assessment & Plan: He feels completely better so not reassessed today Encouraged patient to finish oral antifungals to eradicate. Discussed risk of recurrence.   Other fatigue  Acute cough  Other complicated headache syndrome

## 2022-09-02 NOTE — Progress Notes (Signed)
[facility]: [phone]   Routine Medical Office Visit  Patient:  Joshua [name]      Age: 82 y.o.       Sex:  male  Date:   09/02/2022 PCP:    Shelva Majestic, MD   Today's Healthcare Provider: [name], MD   Assessment and Plan:     Javid was seen today for 2 week follow-up and inhaled pill.  Inhaled foreign object, initial encounter Assessment & Plan: inhaled a pill, leading to cough, fatigue, and headache worsening but report no shortness of breath or fever. There is concern for potential pneumonia due to the foreign body in the lungs. We will order a chest x-ray today at [facility] across from [facility] and check basic blood work to rule out liver damage from antifungal medication and to check for signs of infection. They are advised to take long, hot showers for steam treatments to help dissolve the pill in the lungs.   Balanitis Overview: 08/05/2022 interim history: essentially resolving with oral antifungals after only partially resolvling with topical antifungals.    Photographs Taken 07/2022     Assessment & Plan: Updated problem overview / problem list entry for this problem- but hesitate to completely resolve problem although he reports its essentially gone Feel comfortable attributing this to fungal uretheral orifice infection(s), recurrent, due to chronic urinary incontinence, due to complications of prostatectomy for prostate cancer  He feels completely better so not reassessed today Encouraged patient to finish oral antifungals to eradicate. Discussed risk of recurrence and importance of continuing to use barrier.   Other fatigue  Acute cough -     DG Chest 2 View; Future -     CBC with Differential/Platelet -     Comprehensive metabolic panel  Other complicated headache syndrome           Clinical Presentation:    82 y.o. male here today for 2 week follow-up (For balanitis recheck) and inhaled  pill  AI-Extracted: Discussed the use of AI scribe software for clinical note transcription with the patient, who gave verbal consent to proceed.  History of Present Illness   The patient, with a history of balanitis and a recent initiation of antifungal medication, presents with a new onset of symptoms following an incident where they accidentally inhaled a pill. The patient reports feeling "terrible" since the incident, with persistent cough, fatigue, and a worsening headache. They also describe an attempt to regurgitate the pill shortly after inhalation, which was unsuccessful. The patient denies any shortness of breath but reports feeling exhausted after physical activity.  The patient also reports a history of bypass surgery in 2001 and is currently on multiple medications, including gabapentin. They have been managing their balanitis with medication and believe the condition has resolved, although they continue to apply the medication as a precaution.  The patient also mentions a potential issue with a tooth, which may be contributing to their headache. They took two arthritic Tylenols the previous night for this issue, which seemed to provide some relief. The patient denies any yellowing of the eyes or other symptoms suggestive of liver damage.  The patient's symptoms of cough, fatigue, and headache reportedly started after the pill inhalation incident, and they have been progressively worsening. The patient is unsure which pill was inhaled but believes it was a small one. They also report a persistent cough since the incident, which they believe is their lungs trying to expel the inhaled pill.  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-sided sciatica 10/09/2011   Neuropathy (HCC) 08/21/2013   Dizziness 08/23/2013   CKD (chronic kidney disease), stage III (HCC) 10/23/2013   Fatigue 11/09/2013   Allergic rhinitis 05/31/2014   Hereditary and idiopathic peripheral neuropathy 09/25/2014   Obesity, Class I, BMI 30-34.9 10/03/2015   Gout 11/20/2016   Venous insufficiency 12/29/2016   Ganglion cyst of tendon sheath of right hand 12/29/2016   Neck pain 02/08/2017   DJD (degenerative joint disease)    Arthritis    Weakness of left leg 06/21/2017   Senile purpura (HCC) 10/07/2017   Hyperglycemia 10/07/2017   Aortic atherosclerosis (HCC) 05/05/2018   Abdominal muscle strain, initial encounter 06/16/2018   Greater trochanteric bursitis of left hip 10/27/2018   Degenerative disc disease, lumbar 10/27/2018   Educated about COVID-19 virus infection 04/20/2019   Left lateral epicondylitis 09/05/2019   Overactive bladder 11/24/2019   Trigger thumb, left thumb 01/24/2020   Coronary artery disease involving coronary bypass graft of native heart without angina pectoris 04/07/2020   Pre-operative respiratory examination 06/05/2020   Primary osteoarthritis of left knee 06/18/2020   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) 09/25/2020   Bloating 12/27/2020   Hyperactive bowel sounds 01/29/2021   Greater trochanteric bursitis of right hip 02/17/2021   Degenerative cervical disc 01/12/2022   Left hamstring injury 04/21/2022   Degenerative arthritis of right knee 06/08/2022   Balanitis 07/06/2022   Urinary frequency 07/06/2022   Calcium oxalate crystals in urine 07/30/2022   Inhaled foreign object 09/02/2022   Resolved Ambulatory Problems    Diagnosis Date Noted   THRUSH 05/01/2009   Acute frontal sinusitis 01/06/2010   ASTHMA UNSPECIFIED WITH  EXACERBATION 04/22/2009   CONSTIPATION 01/31/2009   LOC OSTEOARTHROS NOT SPEC PRIM/SEC PELV RGN&THI 12/26/2008   OSTEOARTHROSIS NOS, UNSPECIFIED SITE 11/02/2006   HIP PAIN, RIGHT 12/26/2008   MEDIAL EPICONDYLITIS, LEFT 08/25/2007   HEEL PAIN, RIGHT 10/10/2008   SYMPTOM, MEMORY LOSS 12/14/2006   SHORTNESS OF BREATH (SOB) 05/01/2009   CHEST PAIN-UNSPECIFIED 05/01/2009   PULMONARY CONGESTION 04/12/2009   Abdominal pain, epigastric 09/14/2008   MEDIAL MENISCUS TEAR 05/05/2007   TOBACCO ABUSE, HX OF 12/26/2008   Pain in lower limb 11/07/2013   Precordial pain 02/06/2014   Neuralgia 04/10/2014   Porokeratosis 04/10/2014   Anal fissure 12/06/2014   Rectal bleeding 12/06/2014   CN (constipation) 12/06/2014   Musculoskeletal chest pain 04/24/2015   Dyslipidemia 09/23/2016   Overweight 09/23/2016   Prostate cancer (HCC) 04/23/2010   Pneumonia    Pancreatitis    Obesity    IBS (irritable bowel syndrome)    Hypertension    History of gout    History of blood transfusion 03/24/1999   GERD (gastroesophageal reflux disease)    Diverticulosis    Depression    Coronary artery disease involving native coronary artery of native heart without angina pectoris    Bladder cancer (HCC) 04/23/2010   Anemia    Acute medial meniscal tear    Exertional angina 04/18/2018   Past Medical History:  Diagnosis Date   Asthma  CAD (coronary artery disease)    H/O inguinal hernia repair 12/2018   Peripheral vascular disease (HCC)    Pre-diabetes     Outpatient Medications Prior to Visit  Medication Sig   acetaminophen (TYLENOL) 500 MG tablet Take 1,000 mg by mouth every 6 (six) hours as needed for moderate pain or headache.   albuterol (PROAIR HFA) 108 (90 Base) MCG/ACT inhaler Inhale 1-2 puffs into the lungs every 6 (six) hours as needed for wheezing or shortness of breath.   Alirocumab (PRALUENT) 150 MG/ML SOAJ INJECT 1 DOSE INTO THE SKIN EVERY 14 (FOURTEEN) DAYS.   allopurinol (ZYLOPRIM) 100 MG  tablet Take 1 tablet (100 mg total) by mouth daily. History of gout, this lowers uric acid and risk of recurrence   amLODipine (NORVASC) 5 MG tablet TAKE 1 TABLET (5 MG TOTAL) BY MOUTH DAILY.   ARNUITY ELLIPTA 200 MCG/ACT AEPB Inhale 1 puff into the lungs daily.   aspirin EC 81 MG tablet Take 1 tablet (81 mg total) by mouth 2 (two) times daily after a meal.   cholecalciferol (VITAMIN D3) 25 MCG (1000 UNIT) tablet Take 1,000 Units by mouth daily.   clobetasol cream (TEMOVATE) 0.05 % APPLY TO AFFECTED AREA TWICE A DAY See Dr. Durene Cal soon and discuss about further refills.   Cyanocobalamin (B-12) 2500 MCG TABS Take 1,250 mcg by mouth daily.   diazepam (VALIUM) 5 MG tablet Take 1 tablet (5 mg total) by mouth at bedtime as needed (sleep).   EPINEPHrine 0.3 mg/0.3 mL IJ SOAJ injection Inject 0.3 mg into the muscle as needed for anaphylaxis. for allergic reaction   ezetimibe (ZETIA) 10 MG tablet TAKE 1 TABLET BY MOUTH EVERY DAY   fluconazole (DIFLUCAN) 150 MG tablet Take 1 tablet (150 mg total) by mouth every 3 (three) days.   fluticasone (FLONASE) 50 MCG/ACT nasal spray Place 1 spray into both nostrils daily.   folic acid (FOLVITE) 800 MCG tablet Take 800 mcg by mouth daily.    gabapentin (NEURONTIN) 300 MG capsule Take 2 cap in AM, 1 cap at noon, 2caps at bedtime   GEMTESA 75 MG TABS TAKE 1 TABLET BY MOUTH EVERY DAY   Glucos-Chond-Hyal Ac-Ca Fructo (MOVE FREE JOINT HEALTH ADVANCE PO) Take 1 tablet by mouth daily.   hydrochlorothiazide (HYDRODIURIL) 25 MG tablet TAKE 1 TABLET BY MOUTH EVERY DAY   ipratropium (ATROVENT HFA) 17 MCG/ACT inhaler Inhale 2 puffs into the lungs 3 (three) times daily.   Miconazole Nitrate (LOTRIMIN AF DEODORANT POWDER) 2 % AERP Apply 1 spray topically daily as needed (jock itch).   nitroGLYCERIN (NITROSTAT) 0.4 MG SL tablet PLACE 1 TABLET UNDER THE TONGUE EVERY 5 (FIVE) MINUTES AS NEEDED.   nystatin (MYCOSTATIN/NYSTOP) powder Apply 1 Application topically 3 (three) times  daily.   nystatin ointment (MYCOSTATIN) Apply 1 Application topically 2 (two) times daily.   pantoprazole (PROTONIX) 40 MG tablet TAKE 1 TABLET BY MOUTH EVERY DAY   polyethylene glycol powder (GLYCOLAX/MIRALAX) powder Take 17 g by mouth daily.   Probiotic Product (PROBIOTIC PO) Take by mouth in the morning.   silver sulfADIAZINE (SILVADENE) 1 % cream Apply 1 Application topically daily.   traMADol (ULTRAM) 50 MG tablet Take 1 tablet (50 mg total) by mouth every 6 (six) hours as needed (no driving 6 hours after taking and do not take within 6 hours of diazepam).   valsartan (DIOVAN) 320 MG tablet TAKE 1 TABLET BY MOUTH EVERY DAY   Wheat Dextrin (BENEFIBER) POWD Take 1 Dose by mouth  daily. 1 dose = 2 teaspoons   No facility-administered medications prior to visit.         Clinical Data Analysis:   Physical Exam  BP 104/62 (BP Location: Left Arm, Patient Position: Sitting)   Pulse (!) 52   Temp (!) 97.2 F (36.2 C) (Temporal)   Ht 5\' 6"  (1.676 m)   Wt 209 lb 12.8 oz (95.2 kg)   SpO2 95%   BMI 33.86 kg/m  Wt Readings from Last 10 Encounters:  09/02/22 209 lb 12.8 oz (95.2 kg)  08/19/22 212 lb 9.6 oz (96.4 kg)  08/05/22 208 lb 12.8 oz (94.7 kg)  07/29/22 209 lb 9.6 oz (95.1 kg)  07/29/22 209 lb (94.8 kg)  07/14/22 209 lb (94.8 kg)  07/13/22 211 lb 9.6 oz (96 kg)  07/13/22 211 lb 9.6 oz (96 kg)  07/06/22 213 lb 3.2 oz (96.7 kg)  07/03/22 210 lb (95.3 kg)   Vital signs reviewed.  Nursing notes reviewed. Weight trend reviewed. Abnormalities and Problem-Specific physical exam findings:  declined recheck penis.  He doesn't cough at all and demonstrates no shortness of breath at rest or as I walk him 50 feet to lab, although legs are weak and uses arms to help stand from sitting.  General Appearance:  No acute distress appreciable.   Well-groomed, healthy-appearing male.  Well proportioned with no abnormal fat distribution.  Good muscle tone. Skin: Clear and well-hydrated. Pulmonary:   Normal work of breathing at rest, no respiratory distress apparent. SpO2: 95 %  Musculoskeletal: All extremities are intact.  Neurological:  Awake, alert, oriented, and engaged.  No obvious focal neurological deficits or cognitive impairments.  Sensorium seems unclouded.   Speech is clear and coherent with logical content. Psychiatric:  Appropriate mood, pleasant and cooperative demeanor, thoughtful and engaged during the exam  Results Reviewed:    No results found for any visits on 09/02/22.  Recent Results (from the past 2160 hour(s))  Lipid panel     Status: Abnormal   Collection Time: 06/29/22  8:18 AM  Result Value Ref Range   Cholesterol, Total 115 100 - 199 mg/dL   Triglycerides 161 (H) 0 - 149 mg/dL   HDL 42 >09 mg/dL   VLDL Cholesterol Cal 27 5 - 40 mg/dL   LDL Chol Calc (NIH) 46 0 - 99 mg/dL   Chol/HDL Ratio 2.7 0.0 - 5.0 ratio    Comment:                                   T. Chol/HDL Ratio                                             Men  Women                               1/2 Avg.Risk  3.4    3.3                                   Avg.Risk  5.0    4.4  2X Avg.Risk  9.6    7.1                                3X Avg.Risk 23.4   11.0   POCT Urinalysis Dipstick     Status: Normal   Collection Time: 07/06/22  2:15 PM  Result Value Ref Range   Color, UA yellow    Clarity, UA clear    Glucose, UA Negative Negative   Bilirubin, UA Negative    Ketones, UA Negative    Spec Grav, UA 1.015 1.010 - 1.025   Blood, UA Negative    pH, UA 6.0 5.0 - 8.0   Protein, UA Negative Negative   Urobilinogen, UA 0.2 0.2 or 1.0 E.U./dL   Nitrite, UA Negative    Leukocytes, UA Negative Negative   Appearance     Odor    Cytology (oral, anal, urethral) ancillary only     Status: None   Collection Time: 07/29/22  2:15 PM  Result Value Ref Range   Neisseria Gonorrhea Negative    Chlamydia Negative    Trichomonas Negative    Comment Normal Reference Ranger  Chlamydia - Negative    Comment      Normal Reference Range Neisseria Gonorrhea - Negative   Comment Normal Reference Range Trichomonas - Negative   Urinalysis, Routine w reflex microscopic     Status: Abnormal   Collection Time: 07/29/22  2:19 PM  Result Value Ref Range   Color, Urine YELLOW Yellow;Lt. Yellow;Straw;Dark Yellow;Amber;Green;Red;Brown   APPearance CLEAR Clear;Turbid;Slightly Cloudy;Cloudy   Specific Gravity, Urine 1.025 1.000 - 1.030   pH 5.5 5.0 - 8.0   Total Protein, Urine NEGATIVE Negative   Urine Glucose NEGATIVE Negative   Ketones, ur NEGATIVE Negative   Bilirubin Urine NEGATIVE Negative   Hgb urine dipstick NEGATIVE Negative   Urobilinogen, UA 0.2 0.0 - 1.0   Leukocytes,Ua NEGATIVE Negative   Nitrite NEGATIVE Negative   WBC, UA 0-2/hpf 0-2/hpf   RBC / HPF 0-2/hpf 0-2/hpf   Mucus, UA Presence of (A) None   Squamous Epithelial / HPF Rare(0-4/hpf) Rare(0-4/hpf)   Hyaline Casts, UA Presence of (A) None   Ca Oxalate Crys, UA Presence of (A) None  Urine cytology ancillary only([place])     Status: None   Collection Time: 08/19/22  9:16 AM  Result Value Ref Range   Neisseria Gonorrhea Negative    Chlamydia Negative    Trichomonas Negative    Bacterial Vaginitis-Urine Negative    Candida Urine Negative    Molecular Comment      For tests bacteria and/or candida, this specimen does not meet the   Molecular Comment      strict criteria set by the FDA. The result interpretation should be   Molecular Comment      considered in conjunction with the patient's clinical history.   Comment Normal Reference Range Trichomonas - Negative    Comment Normal Reference Ranger Chlamydia - Negative    Comment      Normal Reference Range Neisseria Gonorrhea - Negative  Urinalysis, Complete     Status: None   Collection Time: 08/19/22  9:24 AM  Result Value Ref Range   Color, Urine YELLOW YELLOW   APPearance CLEAR CLEAR   Specific Gravity, Urine 1.012 1.001 - 1.035   pH  6.0 5.0 - 8.0   Glucose, UA NEGATIVE NEGATIVE   Bilirubin Urine NEGATIVE NEGATIVE   Ketones,  ur NEGATIVE NEGATIVE   Hgb urine dipstick NEGATIVE NEGATIVE   Protein, ur NEGATIVE NEGATIVE   Nitrite NEGATIVE NEGATIVE   Leukocytes,Ua NEGATIVE NEGATIVE   WBC, UA NONE SEEN 0 - 5 /HPF   RBC / HPF NONE SEEN 0 - 2 /HPF   Squamous Epithelial / HPF NONE SEEN < OR = 5 /HPF   Bacteria, UA NONE SEEN NONE SEEN /HPF   Hyaline Cast NONE SEEN NONE SEEN /LPF   Note      Comment: This urine was analyzed for the presence of WBC,  RBC, bacteria, casts, and other formed elements.  Only those elements seen were reported. . .   Comp Met (CMET)     Status: Abnormal   Collection Time: 08/19/22  9:24 AM  Result Value Ref Range   Sodium 136 135 - 145 mEq/L   Potassium 4.2 3.5 - 5.1 mEq/L   Chloride 99 96 - 112 mEq/L   CO2 31 19 - 32 mEq/L   Glucose, Bld 76 70 - 99 mg/dL   BUN 24 (H) 6 - 23 mg/dL   Creatinine, Ser 1.61 0.40 - 1.50 mg/dL   Total Bilirubin 0.7 0.2 - 1.2 mg/dL   Alkaline Phosphatase 62 39 - 117 U/L   AST 17 0 - 37 U/L   ALT 18 0 - 53 U/L   Total Protein 6.8 6.0 - 8.3 g/dL   Albumin 4.1 3.5 - 5.2 g/dL   GFR 09.60 (L) >45.40 mL/min    Comment: Calculated using the CKD-EPI Creatinine Equation (2021)   Calcium 9.6 8.4 - 10.5 mg/dL  Phosphorus     Status: None   Collection Time: 08/19/22  9:24 AM  Result Value Ref Range   Phosphorus 3.2 2.3 - 4.6 mg/dL  Magnesium     Status: None   Collection Time: 08/19/22  9:24 AM  Result Value Ref Range   Magnesium 1.8 1.5 - 2.5 mg/dL  Uric acid     Status: Abnormal   Collection Time: 08/19/22  9:24 AM  Result Value Ref Range   Uric Acid, Serum 8.5 (H) 4.0 - 7.8 mg/dL  Vitamin D (25 hydroxy)     Status: None   Collection Time: 08/19/22  9:24 AM  Result Value Ref Range   VITD 52.29 30.00 - 100.00 ng/mL  HgB A1c     Status: None   Collection Time: 08/19/22  9:24 AM  Result Value Ref Range   Hgb A1c MFr Bld 5.9 4.6 - 6.5 %    Comment: Glycemic  Control Guidelines for People with Diabetes:Non Diabetic:  <6%Goal of Therapy: <7%Additional Action Suggested:  >8%   CBC with Differential/Platelet     Status: None   Collection Time: 08/19/22  9:24 AM  Result Value Ref Range   WBC 8.0 4.0 - 10.5 K/uL   RBC 4.66 4.22 - 5.81 Mil/uL   Hemoglobin 14.4 13.0 - 17.0 g/dL   HCT 98.1 19.1 - 47.8 %   MCV 91.5 78.0 - 100.0 fl   MCHC 33.9 30.0 - 36.0 g/dL   RDW 29.5 62.1 - 30.8 %   Platelets 238.0 150.0 - 400.0 K/uL   Neutrophils Relative % 66.5 43.0 - 77.0 %   Lymphocytes Relative 17.5 12.0 - 46.0 %   Monocytes Relative 11.5 3.0 - 12.0 %   Eosinophils Relative 3.8 0.0 - 5.0 %   Basophils Relative 0.7 0.0 - 3.0 %   Neutro Abs 5.4 1.4 - 7.7 K/uL   Lymphs Abs 1.4 0.7 -  4.0 K/uL   Monocytes Absolute 0.9 0.1 - 1.0 K/uL   Eosinophils Absolute 0.3 0.0 - 0.7 K/uL   Basophils Absolute 0.1 0.0 - 0.1 K/uL    No image results found.   DG Knee AP/LAT W/Sunrise Right  Result Date: 06/28/2022 CLINICAL DATA:  right knee pain EXAM: RIGHT KNEE 3 VIEWS COMPARISON:  03/20/2020. FINDINGS: No evidence of fracture, dislocation, or joint effusion. There is tricompartmental joint space narrowing, sclerosis and osteophytes consistent with degenerative joint disease. No evidence of effusion. Calcification of menisci consistent with chondrocalcinosis. IMPRESSION: Degenerative changes. Chondrocalcinosis. No acute osseous abnormalities. Electronically Signed   By: Layla Maw M.D.   On: 06/28/2022 15:23       This encounter employed real-time, collaborative documentation. The patient actively reviewed and updated their medical record on a shared screen, ensuring transparency and facilitating joint problem-solving for the problem list, overview, and plan. This approach promotes accurate, informed care. The treatment plan was discussed and reviewed in detail, including medication safety, potential side effects, and all patient questions. We confirmed understanding and  comfort with the plan. Follow-up instructions were established, including contacting the office for any concerns, returning if symptoms worsen, persist, or new symptoms develop, and precautions for potential emergency department visits. ----------------------------------------------------- [name], MD  09/02/2022 11:10 AM  [facility]:  [phone]

## 2022-09-02 NOTE — Assessment & Plan Note (Addendum)
Updated problem overview / problem list entry for this problem- but hesitate to completely resolve problem although he reports its essentially gone Feel comfortable attributing this to fungal uretheral orifice infection(s), recurrent, due to chronic urinary incontinence, due to complications of prostatectomy for prostate cancer  He feels completely better so not reassessed today Encouraged patient to finish oral antifungals to eradicate. Discussed risk of recurrence and importance of continuing to use barrier.

## 2022-09-02 NOTE — Assessment & Plan Note (Signed)
inhaled a pill, leading to cough, fatigue, and headache worsening but report no shortness of breath or fever. There is concern for potential pneumonia due to the foreign body in the lungs. We will order a chest x-ray today at [facility] across from [facility] and check basic blood work to rule out liver damage from antifungal medication and to check for signs of infection. They are advised to take long, hot showers for steam treatments to help dissolve the pill in the lungs.

## 2022-09-03 ENCOUNTER — Other Ambulatory Visit: Payer: Self-pay | Admitting: Internal Medicine

## 2022-09-03 ENCOUNTER — Telehealth: Payer: Self-pay | Admitting: Family Medicine

## 2022-09-03 DIAGNOSIS — J690 Pneumonitis due to inhalation of food and vomit: Secondary | ICD-10-CM

## 2022-09-03 MED ORDER — AMOXICILLIN-POT CLAVULANATE 875-125 MG PO TABS
1.0000 | ORAL_TABLET | Freq: Two times a day (BID) | ORAL | 0 refills | Status: DC
Start: 2022-09-03 — End: 2022-09-18

## 2022-09-03 NOTE — Telephone Encounter (Signed)
See below, it looks like this xray was ordered by Dr. Jon Billings.

## 2022-09-03 NOTE — Telephone Encounter (Signed)
Patient requests to be called to be given Chest XRAY results.  Patient states he is worried-chest still hurts

## 2022-09-04 NOTE — Progress Notes (Signed)
Already reviewed results on phone. Completed due to patient felt fatigued, had inhaled a pill, now treating as pneumonia.

## 2022-09-04 NOTE — Telephone Encounter (Signed)
Called and spoke to patient directly yesterday evening about 5pm. Advised patient that my independent interpretation of XR is there is small right lower lobe consolidation suspicious for pneumonia. He reported feeling "just awful" but no fevers or dyspnea He agreed he can tolerate Augmentin for possible early pill aspiration induced pneumonia.

## 2022-09-06 ENCOUNTER — Encounter: Payer: Self-pay | Admitting: Internal Medicine

## 2022-09-07 ENCOUNTER — Encounter: Payer: PPO | Admitting: Pharmacist

## 2022-09-07 NOTE — Progress Notes (Signed)
[name] [address] [place] [address] Phone: [phone] Subjective:   [name], am serving as a scribe for Dr. Antoine Primas.  I'm seeing this patient by the request  of:  Shelva Majestic, MD  CC: Right knee pain follow-up  ONG:[id number]  07/24/2022 Monovisc given today  , Worsening pain at this time.  Patient did appear very uncomfortable.  Discussed that if any worsening symptoms to seek medical attention immediately.  Has had history of gout previously.  Declined any type of any laboratory history of bladder cancer but is seeing neurologist.  Once again discussed with him to be careful otherwise and if worsening symptoms seek medical attention.    Update 09/14/2022 KORIN HARTWELL is a 82 y.o. male coming in with complaint of R knee and R hip pain. Had R hip injection from Dr. Jean Rosenthal on 07/29/2022. Patient states everything is the same. Not better. Sometimes having pain down R tibia. Patient states still affecting daily activities, giving him enough pain that causes him to augment his daily activities.  Does having increasing instability as well.    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"   CAD (coronary artery disease)    CABG 2001   Diverticulosis    DJD (degenerative joint disease)    BACK   GERD (gastroesophageal reflux disease)    H/O inguinal hernia repair 12/2018   History of gout    Hyperlipidemia    Hypertension    IBS (irritable bowel syndrome)    Obesity    OSA (obstructive sleep apnea)    USES CPAP    Pancreatitis ~ 1980   Peripheral vascular disease (HCC)    Pre-diabetes    Prostate cancer (HCC) 04/2010   Past Surgical History:  Procedure Laterality Date   ANTERIOR CERVICAL DECOMP/DISCECTOMY FUSION   05/26/2006   PLATE PLACED   BACK SURGERY     CARDIAC CATHETERIZATION  11/20/1999   CORONARY ARTERY BYPASS GRAFT  11/20/1999   SVG-RI1-RI2, SVG-OM, SVG-dRCA   CORONARY STENT INTERVENTION N/A 04/13/2019   Procedure: CORONARY STENT INTERVENTION;  Surgeon: Kathleene Hazel, MD;  Location: [facility];  Service: Cardiovascular;  Laterality: N/A;   HERNIA REPAIR     INGUINAL HERNIA REPAIR Left 01/02/2019   Procedure: OPEN LEFT INGUINAL HERNIA REPAIR WITH MESH;  Surgeon: Luretha Murphy, MD;  Location: [facility];  Service: General;  Laterality: Left;   KNEE ARTHROSCOPY Right 1992; 04/2008   LAPAROSCOPIC CHOLECYSTECTOMY  2007   LEFT HEART CATH AND CORS/GRAFTS ANGIOGRAPHY N/A 04/13/2019   Procedure: LEFT HEART CATH AND CORS/GRAFTS ANGIOGRAPHY;  Surgeon: Kathleene Hazel, MD;  Location: [facility];  Service: Cardiovascular;  Laterality: N/A;   NASAL SEPTUM SURGERY Right 2007   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   ROBOT ASSISTED LAPAROSCOPIC RADICAL PROSTATECTOMY  04/2010   THYROIDECTOMY, PARTIAL  1980   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   Procedure: LEFT TOTAL KNEE ARTHROPLASTY;  Surgeon: Marcene Corning, MD;  Location: [facility];  Service: Orthopedics;  Laterality: Left;   Social History   Socioeconomic History   Marital status: Married    Spouse name: Not on file   Number of children: Not on file  Years of education: Not on file   Highest education level: Associate degree: occupational, Scientist, product/process development, or vocational program  Occupational History   Occupation: retired    Associate Professor: RETIRED  Tobacco Use   Smoking status: Former    Packs/day: 1.00    Years: 35.00    Additional pack years: 0.00    Total pack years: 35.00    Types: Cigarettes    Quit date: 06/16/1992    Years since quitting: 30.2   Smokeless tobacco: Never  Vaping Use   Vaping Use: Never used  Substance and Sexual Activity   Alcohol use: Yes    Alcohol/week: 2.0 standard drinks of alcohol     Types: 2 Shots of liquor per week    Comment: occ   Drug use: No   Sexual activity: Not Currently    Partners: Female  Other Topics Concern   Not on file  Social History Narrative   Married 42 years in 2015. 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      Hobbies: poker, movies and tv, staying active      Right handed   2 level home with steps he uses   Social Determinants of Corporate investment banker Strain: Low Risk  (07/10/2022)   Overall Financial Resource Strain (CARDIA)    Difficulty of Paying Living Expenses: Not hard at all  Food Insecurity: No Food Insecurity (07/10/2022)   Hunger Vital Sign    Worried About Running Out of Food in the Last Year: Never true    Ran Out of Food in the Last Year: Never true  Transportation Needs: No Transportation Needs (07/10/2022)   PRAPARE - Administrator, Civil Service (Medical): No    Lack of Transportation (Non-Medical): No  Physical Activity: Insufficiently Active (07/10/2022)   Exercise Vital Sign    Days of Exercise per Week: 1 day    Minutes of Exercise per Session: 10 min  Stress: No Stress Concern Present (07/10/2022)   [employer] of Occupational Health - Occupational Stress Questionnaire    Feeling of Stress : Only a little  Social Connections: Moderately Isolated (07/10/2022)   Social Connection and Isolation Panel [NHANES]    Frequency of Communication with Friends and Family: More than three times a week    Frequency of Social Gatherings with Friends and Family: Three times a week    Attends Religious Services: Never    Active Member of Clubs or Organizations: No    Attends Banker Meetings: Never    Marital Status: Married   Allergies  Allergen Reactions   Benadryl [Diphenhydramine] Other (See Comments)    Pt told not to take because of bypass surgery   Bromfed Nausea And Vomiting   Clarithromycin Other (See Comments)      gastritis   Codeine Other (See Comments)    Trouble breathing   Cymbalta [Duloxetine Hcl]     Abdominal pain and later chest pain as well as worsening fatigue    Doxycycline Hyclate Nausea And Vomiting   Lexapro [Escitalopram]     Dizziness   Modafinil Other (See Comments)     anxiety-nervousness   Oxycodone-Acetaminophen Itching   Promethazine Hcl Other (See Comments)     fainting   Quinolones Nausea Only    Cipro "felt real bad"   Repatha [Evolocumab]     Weak, worn out  Telithromycin Nausea And Vomiting   Tramadol Nausea Only   Hydrocodone-Acetaminophen Rash   Statins Other (See Comments)    Leg cramps and makes patient feel bad. Per pt tried multiple in years past   Sulfonamide Derivatives Rash   Family History  Problem Relation Age of Onset   Heart disease Mother    Hyperlipidemia Mother    Heart disease Father    Hyperlipidemia Father    Diabetes Brother    Colon cancer Neg Hx    Pancreatic cancer Neg Hx    Esophageal cancer Neg Hx    Stomach cancer Neg Hx    Liver cancer Neg Hx      Current Outpatient Medications (Cardiovascular):    Alirocumab (PRALUENT) 150 MG/ML SOAJ, INJECT 1 DOSE INTO THE SKIN EVERY 14 (FOURTEEN) DAYS.   amLODipine (NORVASC) 5 MG tablet, TAKE 1 TABLET (5 MG TOTAL) BY MOUTH DAILY.   EPINEPHrine 0.3 mg/0.3 mL IJ SOAJ injection, Inject 0.3 mg into the muscle as needed for anaphylaxis. for allergic reaction   ezetimibe (ZETIA) 10 MG tablet, TAKE 1 TABLET BY MOUTH EVERY DAY   hydrochlorothiazide (HYDRODIURIL) 25 MG tablet, TAKE 1 TABLET BY MOUTH EVERY DAY   nitroGLYCERIN (NITROSTAT) 0.4 MG SL tablet, PLACE 1 TABLET UNDER THE TONGUE EVERY 5 (FIVE) MINUTES AS NEEDED.   valsartan (DIOVAN) 320 MG tablet, TAKE 1 TABLET BY MOUTH EVERY DAY  Current Outpatient Medications (Respiratory):    albuterol (PROAIR HFA) 108 (90 Base) MCG/ACT inhaler, Inhale 1-2 puffs into the lungs every 6 (six) hours as needed for wheezing or shortness of breath.    ARNUITY ELLIPTA 200 MCG/ACT AEPB, Inhale 1 puff into the lungs daily.   fluticasone (FLONASE) 50 MCG/ACT nasal spray, Place 1 spray into both nostrils daily.  Current Outpatient Medications (Analgesics):    acetaminophen (TYLENOL) 500 MG tablet, Take 1,000 mg by mouth every 6 (six) hours as needed for moderate pain or headache.   allopurinol (ZYLOPRIM) 100 MG tablet, Take 1 tablet (100 mg total) by mouth daily. History of gout, this lowers uric acid and risk of recurrence   aspirin EC 81 MG tablet, Take 1 tablet (81 mg total) by mouth 2 (two) times daily after a meal.   traMADol (ULTRAM) 50 MG tablet, Take 1 tablet (50 mg total) by mouth every 6 (six) hours as needed (no driving 6 hours after taking and do not take within 6 hours of diazepam).  Current Outpatient Medications (Hematological):    Cyanocobalamin (B-12) 2500 MCG TABS, Take 1,250 mcg by mouth daily.   folic acid (FOLVITE) 800 MCG tablet, Take 800 mcg by mouth daily.   Current Outpatient Medications (Other):    amoxicillin-clavulanate (AUGMENTIN) 875-125 MG tablet, Take 1 tablet by mouth 2 (two) times daily.   cholecalciferol (VITAMIN D3) 25 MCG (1000 UNIT) tablet, Take 1,000 Units by mouth daily.   clobetasol cream (TEMOVATE) 0.05 %, APPLY TO AFFECTED AREA TWICE A DAY See Dr. Durene Cal soon and discuss about further refills.   diazepam (VALIUM) 5 MG tablet, Take 1 tablet (5 mg total) by mouth at bedtime as needed (sleep).   fluconazole (DIFLUCAN) 150 MG tablet, Take 1 tablet (150 mg total) by mouth every 3 (three) days.   gabapentin (NEURONTIN) 300 MG capsule, Take 2 cap in AM, 1 cap at noon, 2caps at bedtime   Glucos-Chond-Hyal Ac-Ca Fructo (MOVE FREE JOINT HEALTH ADVANCE PO), Take 1 tablet by mouth daily.   Miconazole Nitrate (LOTRIMIN AF DEODORANT POWDER) 2 % AERP, Apply 1  spray topically daily as needed (jock itch).   mirabegron ER (MYRBETRIQ) 50 MG TB24 tablet, Take 1 tablet (50 mg total) by mouth daily.   nystatin  (MYCOSTATIN/NYSTOP) powder, Apply 1 Application topically 3 (three) times daily.   nystatin ointment (MYCOSTATIN), Apply 1 Application topically 2 (two) times daily.   pantoprazole (PROTONIX) 40 MG tablet, TAKE 1 TABLET BY MOUTH EVERY DAY   polyethylene glycol powder (GLYCOLAX/MIRALAX) powder, Take 17 g by mouth daily. (Patient taking differently: Take 17 g by mouth in the morning and at bedtime.)   Probiotic Product (PROBIOTIC PO), Take by mouth in the morning.   Wheat Dextrin (BENEFIBER) POWD, Take 1 Dose by mouth daily. 1 dose = 2 teaspoons   Reviewed prior external information including notes and imaging from  primary care provider As well as notes that were available from care everywhere and other healthcare systems.  Past medical history, social, surgical and family history all reviewed in electronic medical record.  No pertanent information unless stated regarding to the chief complaint.   Review of Systems:  No headache, visual changes, nausea, vomiting, diarrhea, constipation, dizziness, abdominal pain, skin rash, fevers, chills, night sweats, weight loss, swollen lymph nodes,  chest pain, shortness of breath, mood changes. POSITIVE muscle aches, joint swelling, body aches  Objective  Blood pressure 120/72, pulse 62, height 5\' 6"  (1.676 m), weight 212 lb (96.2 kg), SpO2 97 %.   General: No apparent distress alert and oriented x3 mood and affect normal, dressed appropriately.  HEENT: Pupils equal, extraocular movements intact  Respiratory: Patient's speak in full sentences and does not appear short of breath  Cardiovascular: No lower extremity edema, non tender, no erythema  Antalgic gait noted.  Does have trace effusion noted at the right knee.  Crepitus noted.  Contralateral knee does have joint replacement.  After informed written and verbal consent, patient was seated on exam table. Right knee was prepped with alcohol swab and utilizing anterolateral approach, patient's right  knee space was injected with 4:0.5  marcaine 0.5%: Kenalog 40mg /dL. Patient tolerated the procedure well without immediate complications.    Impression and Recommendations:    The above documentation has been reviewed and is accurate and complete Judi Saa, DO

## 2022-09-08 ENCOUNTER — Other Ambulatory Visit: Payer: PPO | Admitting: Pharmacist

## 2022-09-08 ENCOUNTER — Encounter: Payer: Self-pay | Admitting: Pharmacist

## 2022-09-08 DIAGNOSIS — I2510 Atherosclerotic heart disease of native coronary artery without angina pectoris: Secondary | ICD-10-CM

## 2022-09-08 DIAGNOSIS — N3281 Overactive bladder: Secondary | ICD-10-CM

## 2022-09-08 DIAGNOSIS — I10 Essential (primary) hypertension: Secondary | ICD-10-CM

## 2022-09-08 DIAGNOSIS — J4540 Moderate persistent asthma, uncomplicated: Secondary | ICD-10-CM

## 2022-09-08 NOTE — Progress Notes (Signed)
09/08/2022 Name: Joshua [name] MRN: [id number] DOB: 1940-08-03  No chief complaint on file.   Joshua [name] is a 82 y.o. year old male who presented for a telephone visit.   They were referred to the pharmacist by their PCP for assistance in managing medication access and complex medication management.    Subjective:  Care Team: Primary Care Provider: Shelva Majestic, MD ; Next Scheduled Visit: 09/18/2022 Cardiologist: Dr Joshua [name] and Dr Joshua [name]; Next Scheduled Visit: 11/25/2022 Neurologist: Dr Joshua [name]; Next Scheduled Visit: 04/28/2023 Sports Medicine / Ortho: Dr Joshua [name]; Next Scheduled visit: 09/14/2022  Medication Access/Adherence  Current Pharmacy:  [id number] [place], [place] - [address] [place] [address] Phone: [phone] Fax: [phone]  [id number] [place], [place] - [address] [place] [address] Phone: [phone] Fax: [phone]  [facility] [address] [place] [address] Phone: [phone] Fax: [phone]  [facility] [address] [place] [address] Phone: [phone] Fax: [phone]   Patient reports affordability concerns with their medications: Yes  - states that Joshua [name] is $100 / month; Arnuity is currently affordable at $47 / month but he usually reaches Medicare coverage gap later in each year and cost of Arnuity is about $75 to $100 / month.  Patient reports access/transportation concerns to their pharmacy: No  Patient reports adherence concerns with their medications:  No      Hypertension:  Current medications: hydrochlorothiazide 25mg  daily, amlodipine 5mg  daily, valsartan 320mg  daily   Patient denies hypotensive s/sx including no dizziness, lightheadedness.  Patient denies hypertensive symptoms including no headache, chest pain    Hyperlipidemia/ASCVD Risk  Reduction  Current lipid lowering medications:  Praulent 150mg  SQ every 14 days and ezetimibe 10mg  daily Medications tried in the past:   Rosuvastatin 10 and 20mg  mg daily - severe muscle pain, problems standing up and walking Atorvastatin 40mg  daily - severe muscle pain  Simvastatin 20 and 40mg  - myalgias; stopped once medication was discontinued.   Antiplatelet regimen: aspirin 81mg  daily   ASCVD History: yes  Current medication access support: Receives assistance from Joshua [name] Hypercholesterolemia grant for cost of Praluent   Asthma:  Current medications: Arnuity inhaler - 1 puff into lungs once a day; albuterol HFA inhaler - use 1 or 2 puff as needed for shortness of breath up to every 6 hours.  Medications tried in the past: Flovent - stopped due to cost / discontinued. Ipratropium - stopped 06/2022 by Joshua [name].   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  Also recently has had balanitis which has improved with antifungal treatment. Urologist also seemed to feel that moisture with incontinence pads was leading to increase in infections.  Has seen Dr Joshua [name] and his NP Joshua [name] in April 2024. Patient states he will see Dr Joshua [name] (specialized in incontinence) in September.  Patient is taking Joshua [name] but states that it is not working as well as it had when he first started.  Past medications Myrbetriq - stopped due to limited effectiveness and cost.    Objective:  Lab Results  Component Value Date   HGBA1C 5.9 08/19/2022    Lab Results  Component Value Date   CREATININE 1.46 09/02/2022   BUN 21 09/02/2022   NA 134 (L) 09/02/2022  K 4.3 09/02/2022   CL 100 09/02/2022   CO2 28 09/02/2022    Lab Results  Component Value Date   CHOL 115 06/29/2022   HDL 42 06/29/2022   LDLCALC 46  06/29/2022   LDLDIRECT 115.0 06/04/2016   TRIG 159 (H) 06/29/2022   CHOLHDL 2.7 06/29/2022    Medications Reviewed Today     Reviewed by Joshua [name] (Pharmacist) on 09/08/22 at 0912  Med List Status: <None>   Medication Order Taking? Sig Documenting Provider Last Dose Status Informant  acetaminophen (TYLENOL) 500 MG tablet [id number] No Take 1,000 mg by mouth every 6 (six) hours as needed for moderate pain or headache. [provider] Taking Active Spouse/Significant Other  albuterol (PROAIR HFA) 108 (90 Base) MCG/ACT inhaler [id number] No Inhale 1-2 puffs into the lungs every 6 (six) hours as needed for wheezing or shortness of breath. Dulce Sellar, NP Taking Active   Alirocumab (PRALUENT) 150 MG/ML SOAJ [id number] No INJECT 1 DOSE INTO THE SKIN EVERY 14 (FOURTEEN) DAYS. Chrystie Nose, MD Taking Active   allopurinol (ZYLOPRIM) 100 MG tablet [id number] No Take 1 tablet (100 mg total) by mouth daily. History of gout, this lowers uric acid and risk of recurrence [name], MD Taking Active   amLODipine (NORVASC) 5 MG tablet [id number] No TAKE 1 TABLET (5 MG TOTAL) BY MOUTH DAILY. Chrystie Nose, MD Taking Active   amoxicillin-clavulanate (AUGMENTIN) 875-125 MG tablet [id number]  Take 1 tablet by mouth 2 (two) times daily. [name], MD  Active   ARNUITY ELLIPTA 200 MCG/ACT AEPB [id number] No Inhale 1 puff into the lungs daily. [provider] Taking Active   aspirin EC 81 MG tablet [id number] No Take 1 tablet (81 mg total) by mouth 2 (two) times daily after a meal. Elodia Florence, PA-C Taking Active Spouse/Significant Other           Med Note Joshua Hahn, Barth Kirks D   Fri Nov 01, 2020  9:30 AM) Patient is taking only 1 tab/day  cholecalciferol (VITAMIN D3) 25 MCG (1000 UNIT) tablet [id number] No Take 1,000 Units by mouth daily. [provider] Taking Active Spouse/Significant Other  clobetasol cream (TEMOVATE) 0.05 % [id number] No APPLY TO AFFECTED AREA  TWICE A DAY See Dr. Durene Cal soon and discuss about further refills. [name], MD Taking Active   Cyanocobalamin (B-12) 2500 MCG TABS [id number] No Take 1,250 mcg by mouth daily. [provider] Taking Active Spouse/Significant Other  diazepam (VALIUM) 5 MG tablet [id number] No Take 1 tablet (5 mg total) by mouth at bedtime as needed (sleep). Joshua Majestic, MD Taking Active   EPINEPHrine 0.3 mg/0.3 mL IJ SOAJ injection [id number] No Inject 0.3 mg into the muscle as needed for anaphylaxis. for allergic reaction [provider] Taking Active            Med Note (DAVIS, SOPHIA A   Wed Jan 29, 2021  9:10 AM) On hand  ezetimibe (ZETIA) 10 MG tablet [id number] No TAKE 1 TABLET BY MOUTH EVERY DAY Rollene Rotunda, MD Taking Active   fluconazole (DIFLUCAN) 150 MG tablet [id number] No Take 1 tablet (150 mg total) by mouth every 3 (three) days. [name], MD Taking Active   fluticasone [facility]) 50 MCG/ACT nasal spray [id number] No Place 1 spray into both nostrils daily. Stacie Glaze, MD Taking Active Spouse/Significant Other  folic acid (FOLVITE) 800 MCG tablet [id number] No Take 800 mcg by mouth daily.  [provider] Taking Active Spouse/Significant  Other           Med Note Joshua Hahn, Dallas Breeding   Fri Nov 01, 2020  9:34 AM) Pt is taking 800 mcg unable to find 400 mcg.  gabapentin (NEURONTIN) 300 MG capsule [id number] No Take 2 cap in AM, 1 cap at noon, 2caps at bedtime Van Clines, MD Taking Active   GEMTESA 75 MG TABS [id number] No TAKE 1 TABLET BY MOUTH EVERY DAY Joshua Majestic, MD Taking Active   Glucos-Chond-Hyal Ac-Ca Fructo (MOVE FREE JOINT HEALTH ADVANCE PO) [id number] No Take 1 tablet by mouth daily. [provider] Taking Active Spouse/Significant Other  hydrochlorothiazide (HYDRODIURIL) 25 MG tablet [id number] No TAKE 1 TABLET BY MOUTH EVERY DAY Joshua Majestic, MD Taking Active   ipratropium (ATROVENT HFA) 17 MCG/ACT inhaler [id number] No Inhale 2  puffs into the lungs 3 (three) times daily. Dulce Sellar, NP Taking Active   Miconazole Nitrate (LOTRIMIN AF DEODORANT POWDER) 2 % AERP [id number] No Apply 1 spray topically daily as needed (jock itch). [provider] Taking Active Spouse/Significant Other  nitroGLYCERIN (NITROSTAT) 0.4 MG SL tablet [id number] No PLACE 1 TABLET UNDER THE TONGUE EVERY 5 (FIVE) MINUTES AS NEEDED. Rollene Rotunda, MD Taking Active            Med Note (DAVIS, SOPHIA A   Wed Jan 29, 2021  9:11 AM) On hand  nystatin (MYCOSTATIN/NYSTOP) powder [id number] No Apply 1 Application topically 3 (three) times daily. [name], MD Taking Active   nystatin ointment (MYCOSTATIN) [id number] No Apply 1 Application topically 2 (two) times daily. [name], MD Taking Active   pantoprazole (PROTONIX) 40 MG tablet [id number] No TAKE 1 TABLET BY MOUTH EVERY DAY Hilty, Lisette Abu, MD Taking Active   polyethylene glycol powder (GLYCOLAX/MIRALAX) powder [id number] No Take 17 g by mouth daily. Rolland Porter, MD Taking Active   Probiotic Product (PROBIOTIC PO) [id number] No Take by mouth in the morning. [provider] Taking Active   silver sulfADIAZINE (SILVADENE) 1 % cream [id number] No Apply 1 Application topically daily. [provider] Taking Active   traMADol (ULTRAM) 50 MG tablet [id number] No Take 1 tablet (50 mg total) by mouth every 6 (six) hours as needed (no driving 6 hours after taking and do not take within 6 hours of diazepam). Joshua Majestic, MD Taking Active   valsartan (DIOVAN) 320 MG tablet [id number] No TAKE 1 TABLET BY MOUTH EVERY DAY Joshua Majestic, MD Taking Active   Wheat Dextrin [facility]) POWD [id number] No Take 1 Dose by mouth daily. 1 dose = 2 teaspoons [provider] Taking Active Spouse/Significant Other              Assessment/Plan:   Hypertension: - Currently controlled - Recommended to check home blood pressure and heart rate. 1 or 2 times per week -  Recommend to continue hydrochlorothiazide, valsartan and amlodipine  Hyperlipidemia/ASCVD Risk Reduction: - Currently controlled. LDL goal is < 70 per Dr Joshua [name] - Recommend to continue Praulent and ezetimibe  Asthma: - Currently controlled.  - Recommend to continue Arnuity daily and albuterol HFA as needed.  - Discussed maintenance versus resuce therapy - Meets financial criteria for Arnuity (though patient might be close to cut off)  patient assistance program through [employer]. Will collaborate with provider, CPhT, and patient to pursue assistance.   OAB:  - Consider retrial of Myrbetriq at higher dose of 50mg  daily. Could be more effective that Gemtesa and lower cost because Myrbetriq is  tier 3 ($47 / month). Will send Dr Durene Cal message to get his input. Would need to monitor blood pressure as Myrbetriq could increase blood pressure.   BP Readings from Last 3 Encounters:  09/02/22 104/62  08/19/22 120/78  08/05/22 112/76     Follow Up Plan: 3 to 6 months  Joshua [name], PharmD Clinical Pharmacist [facility]  [phone]  09/09/2022 - Dr Durene Cal OK'd trial of Myrbetriq 50mg  daily in place of [place]. Prescription sent in at his directions for Myrbetriq 50mg  #30 with 5RFs.

## 2022-09-09 ENCOUNTER — Encounter: Payer: Self-pay | Admitting: Internal Medicine

## 2022-09-09 MED ORDER — MIRABEGRON ER 50 MG PO TB24: 50.0000 mg | ORAL_TABLET | Freq: Every day | ORAL | 5 refills | Status: DC

## 2022-09-09 NOTE — Progress Notes (Signed)
Radiologists are behind but their official read saw no pneumonia.  Still I recommend completing the antibiotic unless you think that is what making you sick.

## 2022-09-09 NOTE — Addendum Note (Signed)
Addended by: Henrene Pastor B on: 09/09/2022 09:04 AM   Modules accepted: Orders

## 2022-09-14 ENCOUNTER — Ambulatory Visit: Payer: PPO | Admitting: Family Medicine

## 2022-09-14 ENCOUNTER — Encounter: Payer: Self-pay | Admitting: Family Medicine

## 2022-09-14 VITALS — BP 120/72 | HR 62 | Ht 66.0 in | Wt 212.0 lb

## 2022-09-14 DIAGNOSIS — M1711 Unilateral primary osteoarthritis, right knee: Secondary | ICD-10-CM | POA: Diagnosis not present

## 2022-09-14 DIAGNOSIS — M2550 Pain in unspecified joint: Secondary | ICD-10-CM | POA: Diagnosis not present

## 2022-09-14 NOTE — Patient Instructions (Signed)
Injection in knee today See you again in

## 2022-09-14 NOTE — Assessment & Plan Note (Addendum)
Discussed with patient again at great length.  Worsening pain though the patient elected to try another injection in the knee.  Warned potential side effects.  Patient knows that he has had different difficulties with it.  Patient is going to continue to stay active otherwise.  Discussed which activities to do and which ones to avoid.  Follow-up again in 6 to 8 weeks.  Could be a candidate for PRP or otherwise consider surgical intervention

## 2022-09-15 ENCOUNTER — Telehealth: Payer: Self-pay

## 2022-09-15 NOTE — Telephone Encounter (Signed)
Mailed [employer] application to pt's home for patient assistance.

## 2022-09-15 NOTE — Telephone Encounter (Signed)
-----   Message from Henrene Pastor, RPH-CPP sent at 09/08/2022  2:58 PM EDT ----- Please start process for [employer] medication assistance program for Arnuity.

## 2022-09-18 ENCOUNTER — Encounter: Payer: Self-pay | Admitting: Family Medicine

## 2022-09-18 ENCOUNTER — Ambulatory Visit (INDEPENDENT_AMBULATORY_CARE_PROVIDER_SITE_OTHER): Payer: PPO | Admitting: Family Medicine

## 2022-09-18 VITALS — BP 120/60 | HR 82 | Temp 97.7°F | Ht 66.0 in | Wt 211.0 lb

## 2022-09-18 DIAGNOSIS — I10 Essential (primary) hypertension: Secondary | ICD-10-CM | POA: Diagnosis not present

## 2022-09-18 DIAGNOSIS — R5383 Other fatigue: Secondary | ICD-10-CM | POA: Diagnosis not present

## 2022-09-18 DIAGNOSIS — G4733 Obstructive sleep apnea (adult) (pediatric): Secondary | ICD-10-CM | POA: Diagnosis not present

## 2022-09-18 DIAGNOSIS — N183 Chronic kidney disease, stage 3 unspecified: Secondary | ICD-10-CM | POA: Diagnosis not present

## 2022-09-18 LAB — COMPREHENSIVE METABOLIC PANEL
ALT: 22 U/L (ref 0–53)
AST: 17 U/L (ref 0–37)
Albumin: 3.9 g/dL (ref 3.5–5.2)
Alkaline Phosphatase: 51 U/L (ref 39–117)
BUN: 22 mg/dL (ref 6–23)
CO2: 27 mEq/L (ref 19–32)
Calcium: 9.6 mg/dL (ref 8.4–10.5)
Chloride: 102 mEq/L (ref 96–112)
Creatinine, Ser: 1.4 mg/dL (ref 0.40–1.50)
GFR: 47.77 mL/min — ABNORMAL LOW (ref >60.00)
Glucose, Bld: 114 mg/dL — ABNORMAL HIGH (ref 70–99)
Potassium: 3.8 mEq/L (ref 3.5–5.1)
Sodium: 136 mEq/L (ref 135–145)
Total Bilirubin: 0.6 mg/dL (ref 0.2–1.2)
Total Protein: 6.5 g/dL (ref 6.0–8.3)

## 2022-09-18 LAB — TSH: TSH: 1.67 u[IU]/mL (ref 0.35–5.50)

## 2022-09-18 NOTE — Patient Instructions (Addendum)
Please stop by lab before you go If you have mychart- we will send your results within 3 business days of [place] receiving them.  If you do not have mychart- we will call you about results within 5 business days of [place] receiving them.  *please also note that you will see labs on mychart as soon as they post. I will later go in and write notes on them- will say "notes from Dr. Durene Cal"   Recommended follow up: Return in about 9 weeks (around 11/20/2022) for followup or sooner if needed.Schedule b4 you leave. -cancel July visit on your way out

## 2022-09-18 NOTE — Progress Notes (Signed)
Phone [phone] In person visit   Subjective:   Joshua [name] is a 82 y.o. year old very pleasant male patient who presents for/with See problem oriented charting Chief Complaint  Patient presents with   Fatigue    Pt still c/o fatigue and exhaustion.   Past Medical History-  Patient Active Problem List   Diagnosis Date Noted   Coronary atherosclerosis 10/12/2006    Priority: High   Overactive bladder 11/24/2019    Priority: Medium    Hyperglycemia 10/07/2017    Priority: Medium    Gout 11/20/2016    Priority: Medium    CKD (chronic kidney disease), stage III (HCC) 10/23/2013    Priority: Medium    Neuropathy (HCC) 08/21/2013    Priority: Medium    History of bladder cancer 04/01/2010    Priority: Medium    Essential hypertension 02/21/2007    Priority: Medium    Hyperlipidemia 10/12/2006    Priority: Medium    Asthma 10/12/2006    Priority: Medium    Aortic atherosclerosis (HCC) 05/05/2018    Priority: Low   Senile purpura (HCC) 10/07/2017    Priority: Low   DJD (degenerative joint disease)     Priority: Low   Arthritis     Priority: Low   Neck pain 02/08/2017    Priority: Low   Venous insufficiency 12/29/2016    Priority: Low   Ganglion cyst of tendon sheath of right hand 12/29/2016    Priority: Low   Obesity, Class I, BMI 30-34.9 10/03/2015    Priority: Low   Allergic rhinitis 05/31/2014    Priority: Low   Fatigue 11/09/2013    Priority: Low   Dizziness 08/23/2013    Priority: Low   Chronic bilateral low back pain with left-sided sciatica 10/09/2011    Priority: Low   OSTEOARTHRITIS, LOWER LEG, LEFT 10/04/2009    Priority: Low   TINNITUS, CHRONIC, 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    Priority: Low   OSA (obstructive sleep apnea) 11/02/2006    Priority: Low   GERD 10/12/2006    Priority: Low   Inhaled foreign object 09/02/2022   Calcium oxalate crystals in urine 07/30/2022   Balanitis 07/06/2022   Urinary frequency 07/06/2022   Degenerative  arthritis of right knee 06/08/2022   Left hamstring injury 04/21/2022   Degenerative cervical disc 01/12/2022   Greater trochanteric bursitis of right hip 02/17/2021   Hyperactive bowel sounds 01/29/2021   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) 09/25/2020   Primary osteoarthritis of left knee 06/18/2020   Pre-operative respiratory examination 06/05/2020   Coronary artery disease involving coronary bypass graft of native heart without angina pectoris 04/07/2020   Trigger thumb, left thumb 01/24/2020   Left lateral epicondylitis 09/05/2019   Educated about COVID-19 virus infection 04/20/2019   Greater trochanteric bursitis of left hip 10/27/2018   Degenerative disc disease, lumbar 10/27/2018   Abdominal muscle strain, initial encounter 06/16/2018   Weakness of left leg 06/21/2017   Hereditary and idiopathic peripheral neuropathy 09/25/2014   Generalized abdominal pain 01/31/2009    Medications- reviewed and updated Current Outpatient Medications  Medication Sig Dispense Refill   acetaminophen (TYLENOL) 500 MG tablet Take 1,000 mg by mouth every 6 (six) hours as needed for moderate pain or headache.     albuterol (PROAIR HFA) 108 (90 Base) MCG/ACT inhaler Inhale 1-2 puffs into the lungs every 6 (six) hours as needed for wheezing or shortness of breath. 18 g 5   Alirocumab (PRALUENT)  150 MG/ML SOAJ INJECT 1 DOSE INTO THE SKIN EVERY 14 (FOURTEEN) DAYS. 6 mL 3   allopurinol (ZYLOPRIM) 100 MG tablet Take 1 tablet (100 mg total) by mouth daily. History of gout, this lowers uric acid and risk of recurrence 90 tablet 3   amLODipine (NORVASC) 5 MG tablet TAKE 1 TABLET (5 MG TOTAL) BY MOUTH DAILY. 90 tablet 3   ARNUITY ELLIPTA 200 MCG/ACT AEPB Inhale 1 puff into the lungs daily.     aspirin EC 81 MG tablet Take 1 tablet (81 mg total) by mouth 2 (two) times daily after a meal. 30 tablet 11   cholecalciferol (VITAMIN D3) 25 MCG (1000 UNIT) tablet Take 1,000 Units by  mouth daily.     clobetasol cream (TEMOVATE) 0.05 % APPLY TO AFFECTED AREA TWICE A DAY See Dr. Durene Cal soon and discuss about further refills. 30 g 0   Cyanocobalamin (B-12) 2500 MCG TABS Take 1,250 mcg by mouth daily.     EPINEPHrine 0.3 mg/0.3 mL IJ SOAJ injection Inject 0.3 mg into the muscle as needed for anaphylaxis. for allergic reaction     ezetimibe (ZETIA) 10 MG tablet TAKE 1 TABLET BY MOUTH EVERY DAY 90 tablet 2   fluconazole (DIFLUCAN) 150 MG tablet Take 1 tablet (150 mg total) by mouth every 3 (three) days. 10 tablet 2   fluticasone (FLONASE) 50 MCG/ACT nasal spray Place 1 spray into both nostrils daily.     folic acid (FOLVITE) 800 MCG tablet Take 800 mcg by mouth daily.      gabapentin (NEURONTIN) 300 MG capsule Take 2 cap in AM, 1 cap at noon, 2caps at bedtime 450 capsule 3   Glucos-Chond-Hyal Ac-Ca Fructo (MOVE FREE JOINT HEALTH ADVANCE PO) Take 1 tablet by mouth daily.     hydrochlorothiazide (HYDRODIURIL) 25 MG tablet TAKE 1 TABLET BY MOUTH EVERY DAY 90 tablet 2   Miconazole Nitrate (LOTRIMIN AF DEODORANT POWDER) 2 % AERP Apply 1 spray topically daily as needed (jock itch).     mirabegron ER (MYRBETRIQ) 50 MG TB24 tablet Take 1 tablet (50 mg total) by mouth daily. 30 tablet 5   nitroGLYCERIN (NITROSTAT) 0.4 MG SL tablet PLACE 1 TABLET UNDER THE TONGUE EVERY 5 (FIVE) MINUTES AS NEEDED. 75 tablet 1   nystatin (MYCOSTATIN/NYSTOP) powder Apply 1 Application topically 3 (three) times daily. 60 g 11   nystatin ointment (MYCOSTATIN) Apply 1 Application topically 2 (two) times daily. 30 g 3   pantoprazole (PROTONIX) 40 MG tablet TAKE 1 TABLET BY MOUTH EVERY DAY 90 tablet 3   polyethylene glycol powder (GLYCOLAX/MIRALAX) powder Take 17 g by mouth daily. (Patient taking differently: Take 17 g by mouth in the morning and at bedtime.) 255 g 0   Probiotic Product (PROBIOTIC PO) Take by mouth in the morning.     valsartan (DIOVAN) 320 MG tablet TAKE 1 TABLET BY MOUTH EVERY DAY 90 tablet 3    Wheat Dextrin (BENEFIBER) POWD Take 1 Dose by mouth daily. 1 dose = 2 teaspoons     diazepam (VALIUM) 5 MG tablet Take 1 tablet (5 mg total) by mouth at bedtime as needed (sleep). (Patient not taking: Reported on 09/18/2022) 30 tablet 1   traMADol (ULTRAM) 50 MG tablet Take 1 tablet (50 mg total) by mouth every 6 (six) hours as needed (no driving 6 hours after taking and do not take within 6 hours of diazepam). (Patient not taking: Reported on 09/18/2022) 20 tablet 1   No current facility-administered medications for this visit.  Objective:  BP 120/60   Pulse 82   Temp 97.7 F (36.5 C)   Ht 5\' 6"  (1.676 m)   Wt 211 lb (95.7 kg)   SpO2 93%   BMI 34.06 kg/m  Gen: NAD, resting comfortably CV: RRR no murmurs rubs or gallops Lungs: CTAB no crackles, wheeze, rhonchi Ext: trace to 1+ edema Skin: warm, dry     Assessment and Plan   # Fatigue S: Patient was seen on 09/02/2022 by Dr. Delmar Landau had inhaled a pill which led to cough, fatigue and headache worsening but without shortness of breath.  Chest CT was ordered to rule out pneumonia.  He was advised to take long hot showers or steam treatments to dissolve the pill in the lungs. He reports cough improved. Had sensation of pill dissolving when in hot shower. Unfortunately fatigue has persisted. His knee felt better with Toradol as well and even went into the gym. Still getting very sleepy at nighttime and with ongoing fatigue- falling asleep by 8 or so which is atypical. Using CPAP- feels better with 5-6 hours of use than using whole night- sleeping close to 10 hours and still feeling tired. He finished antibiotics.   Also seen for balanitis-oral antifungals have been helpful.  Dr. Jon Billings previously treated him with antifungal cream but did not have full resolution. He reports this has cleared A/P: I suspect fatigue is residual from recent aspiration and likely pneumonia- he completed treatment and is improving- we opted to update labs as  below and follow up as needed for this . He is hoping if his pain in knee does better he can do the gym more- I think that would be helpful. No chest pain or son thankfully.  - with fatigue update CMP after recent antifungal oral use, also check TSH, CBC was normal when having fatigue about 2 weeks ago. Vitamin D was normal   #Hypertension/CKD stage III S: Compliant with amlodipine 5 mg, hydrochlorothiazide 25 mg and valsartan 320mg  -GFR has been stable in general- very slight decrease on last check.  On ARB in case proteinuric element BP Readings from Last 3 Encounters:  09/18/22 120/60  09/14/22 120/72  09/02/22 104/62  A/P: hypertension -stable- continue current medicines   CKD III- mild decrease on last check- will update GFR again today- Toradol could affect this slightly  #Knee pain- wants to avoid replacements- looking at PRP injections - discomfort with life change and pains- taking some of joy of life away but declines referral to therapy. Patient Health Questionnaire (PHQ) depression screen survey controlled-prior attempts at therapy with variable benefit    09/18/2022    7:59 AM 09/02/2022    9:19 AM 06/04/2022    8:41 AM  Depression screen PHQ 2/9  Decreased Interest 0 0 0  Down, Depressed, Hopeless 0 0 1  PHQ - 2 Score 0 0 1  Altered sleeping 0  0  Tired, decreased energy 0  0  Change in appetite 0  0  Feeling bad or failure about yourself  0  0  Trouble concentrating 0  0  Moving slowly or fidgety/restless 0  0  Suicidal thoughts 0  0  PHQ-9 Score 0  1  Difficult doing work/chores Not difficult at all  Not difficult at all   Recommended follow up: Return in about 9 weeks (around 11/20/2022) for followup or sooner if needed.Schedule b4 you leave. Future Appointments  Date Time Provider Department Center  10/13/2022  8:20 AM Shelva Majestic, MD  [facility]  10/28/2022  9:15 AM Judi Saa, DO [facility] None  11/25/2022  9:40 AM Rollene Rotunda, MD [facility] None   04/28/2023  8:30 AM Van Clines, MD [facility] None  06/10/2023 10:00 AM [facility] ANNUAL WELLNESS VISIT 1 [facility]   Lab/Order associations: NOT fasting   ICD-10-CM   1. Fatigue, unspecified type  R53.83 Comprehensive metabolic panel    TSH    2. Essential hypertension  I10     3. Stage 3 chronic kidney disease, unspecified whether stage 3a or 3b CKD (HCC)  N18.30      No orders of the defined types were placed in this encounter.  Return precautions advised.  Tana Conch, MD

## 2022-09-21 ENCOUNTER — Encounter: Payer: Self-pay | Admitting: Physical Medicine & Rehabilitation

## 2022-10-01 ENCOUNTER — Other Ambulatory Visit: Payer: Self-pay

## 2022-10-01 ENCOUNTER — Encounter: Payer: Self-pay | Admitting: Family Medicine

## 2022-10-01 MED ORDER — MIRABEGRON ER 50 MG PO TB24: 50.0000 mg | ORAL_TABLET | Freq: Every day | ORAL | 1 refills | Status: DC

## 2022-10-01 NOTE — Telephone Encounter (Signed)
Rx sent 

## 2022-10-13 ENCOUNTER — Ambulatory Visit: Payer: PPO | Admitting: Family Medicine

## 2022-10-15 ENCOUNTER — Ambulatory Visit: Payer: PPO | Admitting: Sports Medicine

## 2022-10-15 ENCOUNTER — Ambulatory Visit (INDEPENDENT_AMBULATORY_CARE_PROVIDER_SITE_OTHER): Payer: PPO

## 2022-10-15 VITALS — BP 130/80 | HR 70 | Ht 66.0 in

## 2022-10-15 DIAGNOSIS — M79641 Pain in right hand: Secondary | ICD-10-CM | POA: Diagnosis not present

## 2022-10-15 DIAGNOSIS — M1811 Unilateral primary osteoarthritis of first carpometacarpal joint, right hand: Secondary | ICD-10-CM | POA: Diagnosis not present

## 2022-10-15 NOTE — Progress Notes (Signed)
Joshua [name] D.Joshua [name] [address] [place] [address] Phone: [phone]   Assessment and Plan:     1. Right hand pain 2. Arthritis of carpometacarpal (CMC) joint of right thumb  -Chronic with exacerbation, initial sports [name] visit - Patient presenting with several days of right hand and wrist pain likely represent a flare of osteoarthritis of carpal bones and CMC joint based on HPI, physical exam, x-ray imaging - Recommend using wrist brace and no heavy lifting for the next 1 week - Recommend warm hand baths and topical Voltaren gel over areas of pain - Recommend Tylenol 650 mg 2-3 times a day for day-to-day pain relief - X-ray obtained in clinic.  My interpretation: No acute fracture or dislocation.  Degenerative changes most significant at [facility], though present throughout carpal bones and MCPs  Pertinent previous records reviewed include none   Follow Up: Patient has follow-up already scheduled with Joshua [name] in 2 weeks.  He may keep this follow-up   Subjective:   I, Joshua [name], am serving as a Neurosurgeon for Doctor Joshua [name]  Chief Complaint: right hand/ wrist pain   HPI:   10/15/22 Patient is a 82 year old male complaining of right hand and wrist pain. Patient state that his wrist pain started a couple of days ago. The hand intermittent pain for a while. No MOI. He did start lifting light weights. Tylenol for the pain and that seemed to help some. Decreases grip strength. Decreased grip strength. He states all his fingers will be locked when he wakes in the am   Relevant Historical Information: Hypertension, GERD, CKD  Additional pertinent review of systems negative.   Current Outpatient Medications:    acetaminophen (TYLENOL) 500 MG tablet, Take 1,000 mg by mouth every 6 (six) hours as needed for moderate pain or headache., Disp: , Rfl:    albuterol (PROAIR HFA) 108 (90 Base) MCG/ACT inhaler, Inhale 1-2 puffs into  the lungs every 6 (six) hours as needed for wheezing or shortness of breath., Disp: 18 g, Rfl: 5   Alirocumab (PRALUENT) 150 MG/ML SOAJ, INJECT 1 DOSE INTO THE SKIN EVERY 14 (FOURTEEN) DAYS., Disp: 6 mL, Rfl: 3   allopurinol (ZYLOPRIM) 100 MG tablet, Take 1 tablet (100 mg total) by mouth daily. History of gout, this lowers uric acid and risk of recurrence, Disp: 90 tablet, Rfl: 3   amLODipine (NORVASC) 5 MG tablet, TAKE 1 TABLET (5 MG TOTAL) BY MOUTH DAILY., Disp: 90 tablet, Rfl: 3   ARNUITY ELLIPTA 200 MCG/ACT AEPB, Inhale 1 puff into the lungs daily., Disp: , Rfl:    aspirin EC 81 MG tablet, Take 1 tablet (81 mg total) by mouth 2 (two) times daily after a meal., Disp: 30 tablet, Rfl: 11   cholecalciferol (VITAMIN D3) 25 MCG (1000 UNIT) tablet, Take 1,000 Units by mouth daily., Disp: , Rfl:    clobetasol cream (TEMOVATE) 0.05 %, APPLY TO AFFECTED AREA TWICE A DAY See Dr. Durene Cal soon and discuss about further refills., Disp: 30 g, Rfl: 0   Cyanocobalamin (B-12) 2500 MCG TABS, Take 1,250 mcg by mouth daily., Disp: , Rfl:    diazepam (VALIUM) 5 MG tablet, Take 1 tablet (5 mg total) by mouth at bedtime as needed (sleep)., Disp: 30 tablet, Rfl: 1   EPINEPHrine 0.3 mg/0.3 mL IJ SOAJ injection, Inject 0.3 mg into the muscle as needed for anaphylaxis. for allergic reaction, Disp: , Rfl:    ezetimibe (ZETIA) 10 MG tablet,  TAKE 1 TABLET BY MOUTH EVERY DAY, Disp: 90 tablet, Rfl: 2   fluconazole (DIFLUCAN) 150 MG tablet, Take 1 tablet (150 mg total) by mouth every 3 (three) days., Disp: 10 tablet, Rfl: 2   fluticasone (FLONASE) 50 MCG/ACT nasal spray, Place 1 spray into both nostrils daily., Disp: , Rfl:    folic acid (FOLVITE) 800 MCG tablet, Take 800 mcg by mouth daily. , Disp: , Rfl:    gabapentin (NEURONTIN) 300 MG capsule, Take 2 cap in AM, 1 cap at noon, 2caps at bedtime, Disp: 450 capsule, Rfl: 3   Glucos-Chond-Hyal Ac-Ca Fructo (MOVE FREE JOINT HEALTH ADVANCE PO), Take 1 tablet by mouth daily., Disp: ,  Rfl:    hydrochlorothiazide (HYDRODIURIL) 25 MG tablet, TAKE 1 TABLET BY MOUTH EVERY DAY, Disp: 90 tablet, Rfl: 2   Miconazole Nitrate (LOTRIMIN AF DEODORANT POWDER) 2 % AERP, Apply 1 spray topically daily as needed (jock itch)., Disp: , Rfl:    mirabegron ER (MYRBETRIQ) 50 MG TB24 tablet, Take 1 tablet (50 mg total) by mouth daily., Disp: 90 tablet, Rfl: 1   nitroGLYCERIN (NITROSTAT) 0.4 MG SL tablet, PLACE 1 TABLET UNDER THE TONGUE EVERY 5 (FIVE) MINUTES AS NEEDED., Disp: 75 tablet, Rfl: 1   nystatin (MYCOSTATIN/NYSTOP) powder, Apply 1 Application topically 3 (three) times daily., Disp: 60 g, Rfl: 11   nystatin ointment (MYCOSTATIN), Apply 1 Application topically 2 (two) times daily., Disp: 30 g, Rfl: 3   pantoprazole (PROTONIX) 40 MG tablet, TAKE 1 TABLET BY MOUTH EVERY DAY, Disp: 90 tablet, Rfl: 3   polyethylene glycol powder (GLYCOLAX/MIRALAX) powder, Take 17 g by mouth daily. (Patient taking differently: Take 17 g by mouth in the morning and at bedtime.), Disp: 255 g, Rfl: 0   Probiotic Product (PROBIOTIC PO), Take by mouth in the morning., Disp: , Rfl:    traMADol (ULTRAM) 50 MG tablet, Take 1 tablet (50 mg total) by mouth every 6 (six) hours as needed (no driving 6 hours after taking and do not take within 6 hours of diazepam)., Disp: 20 tablet, Rfl: 1   valsartan (DIOVAN) 320 MG tablet, TAKE 1 TABLET BY MOUTH EVERY DAY, Disp: 90 tablet, Rfl: 3   Wheat Dextrin (BENEFIBER) POWD, Take 1 Dose by mouth daily. 1 dose = 2 teaspoons, Disp: , Rfl:    Objective:     Vitals:   10/15/22 1334  BP: 130/80  Pulse: 70  SpO2: 97%  Height: 5\' 6"  (1.676 m)      Body mass index is 34.06 kg/m.    Physical Exam:    General: Appears well, nad, nontoxic and pleasant Neuro:sensation intact, strength is 5/5 with df/pf/inv/ev, muscle tone wnl Skin:no susupicious lesions or rashes  Right wrist:   No deformity or swelling appreciated. ROM  Ext 90, flexion70, radial/ulnar deviation 30 TTP CMC, first  MCP, radial and ulnar styloid, fifth MCP nttp over the dorsal carpals, volar carpals, Negative Tinel's Negative finklestein pain with resisted ext, flex or deviation    Electronically signed by:  Joshua [name] D.Joshua [name] 3:01 PM 10/15/22

## 2022-10-15 NOTE — Patient Instructions (Addendum)
Tylenol 650 mg 2-3 times a day for pain relief Wrist brace 1 week and come out as tolerated after  week  Voltaren gel over areas of pain  Warm hand baths  Keep follow up with Dr. Katrinka Blazing

## 2022-10-16 DIAGNOSIS — Z87891 Personal history of nicotine dependence: Secondary | ICD-10-CM | POA: Diagnosis not present

## 2022-10-16 DIAGNOSIS — J4520 Mild intermittent asthma, uncomplicated: Secondary | ICD-10-CM | POA: Diagnosis not present

## 2022-10-16 DIAGNOSIS — G609 Hereditary and idiopathic neuropathy, unspecified: Secondary | ICD-10-CM | POA: Diagnosis not present

## 2022-10-16 DIAGNOSIS — D692 Other nonthrombocytopenic purpura: Secondary | ICD-10-CM | POA: Diagnosis not present

## 2022-10-20 DIAGNOSIS — Z8546 Personal history of malignant neoplasm of prostate: Secondary | ICD-10-CM | POA: Diagnosis not present

## 2022-10-20 DIAGNOSIS — L304 Erythema intertrigo: Secondary | ICD-10-CM | POA: Diagnosis not present

## 2022-10-21 ENCOUNTER — Encounter (INDEPENDENT_AMBULATORY_CARE_PROVIDER_SITE_OTHER): Payer: Self-pay

## 2022-10-22 ENCOUNTER — Other Ambulatory Visit: Payer: Self-pay | Admitting: Family Medicine

## 2022-10-23 NOTE — Telephone Encounter (Signed)
Sent pt mychart message to f/u on application being rec'd.

## 2022-10-27 DIAGNOSIS — G4733 Obstructive sleep apnea (adult) (pediatric): Secondary | ICD-10-CM | POA: Diagnosis not present

## 2022-10-27 NOTE — Progress Notes (Unsigned)
[name] [address] [place] [address] Phone: [phone] Subjective:   Joshua [name], am serving as a scribe for Dr. Antoine Primas.  I'm seeing this patient by the request  of:  Shelva Majestic, MD  CC: right knee pain   [id number]  09/14/2022 Discussed with patient again at great length. Worsening pain though the patient elected to try another injection in the knee. Warned potential side effects. Patient knows that he has had different difficulties with it. Patient is going to continue to stay active otherwise. Discussed which activities to do and which ones to avoid. Follow-up again in 6 to 8 weeks. Could be a candidate for PRP or otherwise consider surgical intervention   Update 10/28/2022 Joshua [name] is a 82 y.o. male coming in with complaint of R hand and R knee pain. Patient states that his knee is doing better but still has pain on medial aspect of knee. Toradol injection was helpful.   Wrist pain worsened 7 weeks ago. Pain near [facility] joint but throughout the wrist. Has been doing exercises given by Dr. Denyse Amass. Does feel stronger but still has pain.      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"   CAD (coronary artery disease)    CABG 2001   Diverticulosis    DJD (degenerative joint disease)    BACK   GERD (gastroesophageal reflux disease)    H/O inguinal hernia repair 12/2018   History of gout    Hyperlipidemia    Hypertension    IBS (irritable bowel syndrome)    Obesity    OSA (obstructive sleep apnea)    USES CPAP    Pancreatitis ~ 1980   Peripheral vascular disease (HCC)    Pre-diabetes    Prostate cancer (HCC) 04/2010   Past Surgical History:  Procedure Laterality Date   ANTERIOR CERVICAL DECOMP/DISCECTOMY  FUSION  05/26/2006   PLATE PLACED   BACK SURGERY     CARDIAC CATHETERIZATION  11/20/1999   CORONARY ARTERY BYPASS GRAFT  11/20/1999   SVG-RI1-RI2, SVG-OM, SVG-dRCA   CORONARY STENT INTERVENTION N/A 04/13/2019   Procedure: CORONARY STENT INTERVENTION;  Surgeon: Kathleene Hazel, MD;  [facility]: [facility];  Service: Cardiovascular;  Laterality: N/A;   HERNIA REPAIR     INGUINAL HERNIA REPAIR Left 01/02/2019   Procedure: OPEN LEFT INGUINAL HERNIA REPAIR WITH MESH;  Surgeon: Luretha Murphy, MD;  Location: [facility];  Service: General;  Laterality: Left;   KNEE ARTHROSCOPY Right 1992; 04/2008   LAPAROSCOPIC CHOLECYSTECTOMY  2007   LEFT HEART CATH AND CORS/GRAFTS ANGIOGRAPHY N/A 04/13/2019   Procedure: LEFT HEART CATH AND CORS/GRAFTS ANGIOGRAPHY;  Surgeon: Kathleene Hazel, MD;  Location: [facility];  Service: Cardiovascular;  Laterality: N/A;   NASAL SEPTUM SURGERY Right 2007   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   ROBOT ASSISTED LAPAROSCOPIC RADICAL PROSTATECTOMY  04/2010   THYROIDECTOMY, PARTIAL  1980   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   Procedure: LEFT TOTAL KNEE ARTHROPLASTY;  Surgeon: Marcene Corning, MD;  Location: [facility];  Service: Orthopedics;  Laterality: Left;   Social History   Socioeconomic History   Marital status: Married    Spouse name: Not on file  Number of children: Not on file   Years of education: Not on file   Highest education level: Associate degree: occupational, technical, or vocational program  Occupational History   Occupation: retired    Associate Professor: RETIRED  Tobacco Use   Smoking status: Former    Current packs/day: 0.00    Average packs/day: 1 pack/day for 35.0 years (35.0 ttl pk-yrs)    Types: Cigarettes    Start date: 06/16/1957    Quit date: 06/16/1992    Years since quitting: 30.3   Smokeless tobacco: Never  Vaping Use   Vaping status: Never Used  Substance and Sexual Activity   Alcohol use: Yes    Alcohol/week: 2.0  standard drinks of alcohol    Types: 2 Shots of liquor per week    Comment: occ   Drug use: No   Sexual activity: Not Currently    Partners: Female  Other Topics Concern   Not on file  Social History Narrative   Married 42 years in 2015. 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      Hobbies: poker, movies and tv, staying active      Right handed   2 level home with steps he uses   Social Determinants of Corporate investment banker Strain: Low Risk  (07/10/2022)   Overall Financial Resource Strain (CARDIA)    Difficulty of Paying Living Expenses: Not hard at all  Food Insecurity: No Food Insecurity (07/10/2022)   Hunger Vital Sign    Worried About Running Out of Food in the Last Year: Never true    Ran Out of Food in the Last Year: Never true  Transportation Needs: No Transportation Needs (07/10/2022)   PRAPARE - Administrator, Civil Service (Medical): No    Lack of Transportation (Non-Medical): No  Physical Activity: Insufficiently Active (07/10/2022)   Exercise Vital Sign    Days of Exercise per Week: 1 day    Minutes of Exercise per Session: 10 min  Stress: No Stress Concern Present (07/10/2022)   [employer] of Occupational Health - Occupational Stress Questionnaire    Feeling of Stress : Only a little  Social Connections: Moderately Isolated (07/10/2022)   Social Connection and Isolation Panel [NHANES]    Frequency of Communication with Friends and Family: More than three times a week    Frequency of Social Gatherings with Friends and Family: Three times a week    Attends Religious Services: Never    Active Member of Clubs or Organizations: No    Attends Banker Meetings: Never    Marital Status: Married   Allergies  Allergen Reactions   Benadryl [Diphenhydramine] Other (See Comments)    Pt told not to take because of bypass surgery   Bromfed Nausea And Vomiting    Clarithromycin Other (See Comments)     gastritis   Codeine Other (See Comments)    Trouble breathing   Cymbalta [Duloxetine Hcl]     Abdominal pain and later chest pain as well as worsening fatigue    Doxycycline Hyclate Nausea And Vomiting   Lexapro [Escitalopram]     Dizziness   Modafinil Other (See Comments)     anxiety-nervousness   Oxycodone-Acetaminophen Itching   Promethazine Hcl Other (See Comments)     fainting   Quinolones Nausea Only    Cipro "felt real bad"   Repatha [Evolocumab]  Weak, worn out   Telithromycin Nausea And Vomiting   Tramadol Nausea Only   Hydrocodone-Acetaminophen Rash   Statins Other (See Comments)    Leg cramps and makes patient feel bad. Per pt tried multiple in years past   Sulfonamide Derivatives Rash   Family History  Problem Relation Age of Onset   Heart disease Mother    Hyperlipidemia Mother    Heart disease Father    Hyperlipidemia Father    Diabetes Brother    Colon cancer Neg Hx    Pancreatic cancer Neg Hx    Esophageal cancer Neg Hx    Stomach cancer Neg Hx    Liver cancer Neg Hx      Current Outpatient Medications (Cardiovascular):    Alirocumab (PRALUENT) 150 MG/ML SOAJ, INJECT 1 DOSE INTO THE SKIN EVERY 14 (FOURTEEN) DAYS.   amLODipine (NORVASC) 5 MG tablet, TAKE 1 TABLET (5 MG TOTAL) BY MOUTH DAILY.   EPINEPHrine 0.3 mg/0.3 mL IJ SOAJ injection, Inject 0.3 mg into the muscle as needed for anaphylaxis. for allergic reaction   ezetimibe (ZETIA) 10 MG tablet, TAKE 1 TABLET BY MOUTH EVERY DAY   hydrochlorothiazide (HYDRODIURIL) 25 MG tablet, TAKE 1 TABLET BY MOUTH EVERY DAY   nitroGLYCERIN (NITROSTAT) 0.4 MG SL tablet, PLACE 1 TABLET UNDER THE TONGUE EVERY 5 (FIVE) MINUTES AS NEEDED.   valsartan (DIOVAN) 320 MG tablet, TAKE 1 TABLET BY MOUTH EVERY DAY  Current Outpatient Medications (Respiratory):    albuterol (PROAIR HFA) 108 (90 Base) MCG/ACT inhaler, Inhale 1-2 puffs into the lungs every 6 (six) hours as needed for  wheezing or shortness of breath.   ARNUITY ELLIPTA 200 MCG/ACT AEPB, Inhale 1 puff into the lungs daily.   fluticasone (FLONASE) 50 MCG/ACT nasal spray, Place 1 spray into both nostrils daily.  Current Outpatient Medications (Analgesics):    acetaminophen (TYLENOL) 500 MG tablet, Take 1,000 mg by mouth every 6 (six) hours as needed for moderate pain or headache.   allopurinol (ZYLOPRIM) 100 MG tablet, Take 1 tablet (100 mg total) by mouth daily. History of gout, this lowers uric acid and risk of recurrence   aspirin EC 81 MG tablet, Take 1 tablet (81 mg total) by mouth 2 (two) times daily after a meal.   traMADol (ULTRAM) 50 MG tablet, Take 1 tablet (50 mg total) by mouth every 6 (six) hours as needed (no driving 6 hours after taking and do not take within 6 hours of diazepam).  Current Outpatient Medications (Hematological):    Cyanocobalamin (B-12) 2500 MCG TABS, Take 1,250 mcg by mouth daily.   folic acid (FOLVITE) 800 MCG tablet, Take 800 mcg by mouth daily.   Current Outpatient Medications (Other):    cholecalciferol (VITAMIN D3) 25 MCG (1000 UNIT) tablet, Take 1,000 Units by mouth daily.   clobetasol cream (TEMOVATE) 0.05 %, APPLY TO AFFECTED AREA TWICE A DAY See Dr. Durene Cal soon and discuss about further refills.   diazepam (VALIUM) 5 MG tablet, TAKE 1 TABLET (5 MG TOTAL) BY MOUTH AT BEDTIME AS NEEDED (SLEEP).   fluconazole (DIFLUCAN) 150 MG tablet, Take 1 tablet (150 mg total) by mouth every 3 (three) days.   gabapentin (NEURONTIN) 300 MG capsule, Take 2 cap in AM, 1 cap at noon, 2caps at bedtime   Glucos-Chond-Hyal Ac-Ca Fructo (MOVE FREE JOINT HEALTH ADVANCE PO), Take 1 tablet by mouth daily.   Miconazole Nitrate (LOTRIMIN AF DEODORANT POWDER) 2 % AERP, Apply 1 spray topically daily as needed (jock itch).   mirabegron ER (  MYRBETRIQ) 50 MG TB24 tablet, Take 1 tablet (50 mg total) by mouth daily.   nystatin (MYCOSTATIN/NYSTOP) powder, Apply 1 Application topically 3 (three) times  daily.   nystatin ointment (MYCOSTATIN), Apply 1 Application topically 2 (two) times daily.   pantoprazole (PROTONIX) 40 MG tablet, TAKE 1 TABLET BY MOUTH EVERY DAY   polyethylene glycol powder (GLYCOLAX/MIRALAX) powder, Take 17 g by mouth daily. (Patient taking differently: Take 17 g by mouth in the morning and at bedtime.)   Probiotic Product (PROBIOTIC PO), Take by mouth in the morning.   Wheat Dextrin (BENEFIBER) POWD, Take 1 Dose by mouth daily. 1 dose = 2 teaspoons    Objective  Blood pressure 120/62, pulse (!) 46, height 5\' 6"  (1.676 m), weight 213 lb (96.6 kg), SpO2 97%.   General: No apparent distress alert and oriented x3 mood and affect   After informed written and verbal consent, patient was seated on exam table. Right knee was prepped with alcohol swab and utilizing anterolateral approach, patient's right knee space was injected with 2 cc of 0.5% Marcaine and injected with 5 cc of PRP leukocyte poor. Patient tolerated the procedure well without immediate complications.   Impression and Recommendations:     The above documentation has been reviewed and is accurate and complete Judi Saa, DO

## 2022-10-28 ENCOUNTER — Ambulatory Visit: Payer: PPO | Admitting: Family Medicine

## 2022-10-28 ENCOUNTER — Encounter: Payer: Self-pay | Admitting: Family Medicine

## 2022-10-28 ENCOUNTER — Ambulatory Visit: Payer: Self-pay

## 2022-10-28 VITALS — Ht 66.0 in

## 2022-10-28 VITALS — BP 120/62 | HR 46 | Ht 66.0 in | Wt 213.0 lb

## 2022-10-28 DIAGNOSIS — M654 Radial styloid tenosynovitis [de Quervain]: Secondary | ICD-10-CM

## 2022-10-28 DIAGNOSIS — M25551 Pain in right hip: Secondary | ICD-10-CM

## 2022-10-28 DIAGNOSIS — M1711 Unilateral primary osteoarthritis, right knee: Secondary | ICD-10-CM

## 2022-10-28 DIAGNOSIS — S62001A Unspecified fracture of navicular [scaphoid] bone of right wrist, initial encounter for closed fracture: Secondary | ICD-10-CM | POA: Diagnosis not present

## 2022-10-28 NOTE — Assessment & Plan Note (Signed)
Patient given injection today.  Post PRP instructions given.  Follow-up again in 6 to 8 weeks

## 2022-10-28 NOTE — Assessment & Plan Note (Signed)
Given exercises, topical anti-inflammatories and a thumb spica splint to wear day and night for 2 weeks then nightly for 2 weeks.  This should be beneficial.  If continuing to have difficulty consider injections but patient does feel nauseous when he gets steroid injections

## 2022-10-28 NOTE — Patient Instructions (Signed)
No ice or IBU for 3 days Heat and Tylenol are ok See me again in 6-8 weeks 

## 2022-10-28 NOTE — Progress Notes (Signed)
Joshua [name] [address] [place] [address] Phone: [phone] Subjective:    I'm seeing this patient by the request  of:  Shelva Majestic, MD  CC: Right right wrist pain  [id number]  Joshua [name] is a 82 y.o. male coming in with complaint of R knee pain.  Right wrist pain.  Seen by another provider and was diagnosed with more of a [facility] arthritis.  Has been wearing a brace at night and is not making any benefit.  And starting affect daily activities.  States that certain movements causes severe pain    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"   CAD (coronary artery disease)    CABG 2001   Diverticulosis    DJD (degenerative joint disease)    BACK   GERD (gastroesophageal reflux disease)    H/O inguinal hernia repair 12/2018   History of gout    Hyperlipidemia    Hypertension    IBS (irritable bowel syndrome)    Obesity    OSA (obstructive sleep apnea)    USES CPAP    Pancreatitis ~ 1980   Peripheral vascular disease (HCC)    Pre-diabetes    Prostate cancer (HCC) 04/2010   Past Surgical History:  Procedure Laterality Date   ANTERIOR CERVICAL DECOMP/DISCECTOMY FUSION  05/26/2006   PLATE PLACED   BACK SURGERY     CARDIAC CATHETERIZATION  11/20/1999   CORONARY ARTERY BYPASS GRAFT  11/20/1999   SVG-RI1-RI2, SVG-OM, SVG-dRCA   CORONARY STENT INTERVENTION N/A 04/13/2019   Procedure: CORONARY STENT INTERVENTION;  Surgeon: Kathleene Hazel, MD;  Location: [facility];  Service: Cardiovascular;  Laterality: N/A;   HERNIA REPAIR     INGUINAL HERNIA REPAIR Left 01/02/2019   Procedure: OPEN LEFT INGUINAL HERNIA REPAIR WITH MESH;  Surgeon: Luretha Murphy, MD;  Location: [facility];  Service: General;  Laterality: Left;    KNEE ARTHROSCOPY Right 1992; 04/2008   LAPAROSCOPIC CHOLECYSTECTOMY  2007   LEFT HEART CATH AND CORS/GRAFTS ANGIOGRAPHY N/A 04/13/2019   Procedure: LEFT HEART CATH AND CORS/GRAFTS ANGIOGRAPHY;  Surgeon: Kathleene Hazel, MD;  Location: [facility];  Service: Cardiovascular;  Laterality: N/A;   NASAL SEPTUM SURGERY Right 2007   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   ROBOT ASSISTED LAPAROSCOPIC RADICAL PROSTATECTOMY  04/2010   THYROIDECTOMY, PARTIAL  1980   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   Procedure: LEFT TOTAL KNEE ARTHROPLASTY;  Surgeon: Marcene Corning, MD;  Location: [facility];  Service: Orthopedics;  Laterality: Left;   Social History   Socioeconomic History   Marital status: Married    Spouse name: Not on file   Number of children: Not on file   Years of education: Not on file   Highest education level: Associate degree: occupational, Scientist, product/process development, or vocational program  Occupational History   Occupation: retired    Associate Professor: RETIRED  Tobacco Use   Smoking status: Former    Current packs/day: 0.00    Average packs/day: 1 pack/day for 35.0 years (35.0 ttl pk-yrs)    Types: Cigarettes    Start date: 06/16/1957    Quit date: 06/16/1992    Years since quitting: 30.3   Smokeless tobacco: Never  Vaping Use  Vaping status: Never Used  Substance and Sexual Activity   Alcohol use: Yes    Alcohol/week: 2.0 standard drinks of alcohol    Types: 2 Shots of liquor per week    Comment: occ   Drug use: No   Sexual activity: Not Currently    Partners: Female  Other Topics Concern   Not on file  Social History Narrative   Married 42 years in 2015. 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      Hobbies: poker, movies and tv, staying active      Right handed   2 level home with steps he uses   Social Determinants of Corporate investment banker Strain: Low Risk  (07/10/2022)   Overall Financial Resource  Strain (CARDIA)    Difficulty of Paying Living Expenses: Not hard at all  Food Insecurity: No Food Insecurity (07/10/2022)   Hunger Vital Sign    Worried About Running Out of Food in the Last Year: Never true    Ran Out of Food in the Last Year: Never true  Transportation Needs: No Transportation Needs (07/10/2022)   PRAPARE - Administrator, Civil Service (Medical): No    Lack of Transportation (Non-Medical): No  Physical Activity: Insufficiently Active (07/10/2022)   Exercise Vital Sign    Days of Exercise per Week: 1 day    Minutes of Exercise per Session: 10 min  Stress: No Stress Concern Present (07/10/2022)   [employer] of Occupational Health - Occupational Stress Questionnaire    Feeling of Stress : Only a little  Social Connections: Moderately Isolated (07/10/2022)   Social Connection and Isolation Panel [NHANES]    Frequency of Communication with Friends and Family: More than three times a week    Frequency of Social Gatherings with Friends and Family: Three times a week    Attends Religious Services: Never    Active Member of Clubs or Organizations: No    Attends Banker Meetings: Never    Marital Status: Married   Allergies  Allergen Reactions   Benadryl [Diphenhydramine] Other (See Comments)    Pt told not to take because of bypass surgery   Bromfed Nausea And Vomiting   Clarithromycin Other (See Comments)     gastritis   Codeine Other (See Comments)    Trouble breathing   Cymbalta [Duloxetine Hcl]     Abdominal pain and later chest pain as well as worsening fatigue    Doxycycline Hyclate Nausea And Vomiting   Lexapro [Escitalopram]     Dizziness   Modafinil Other (See Comments)     anxiety-nervousness   Oxycodone-Acetaminophen Itching   Promethazine Hcl Other (See Comments)     fainting   Quinolones Nausea Only    Cipro "felt real bad"   Repatha [Evolocumab]     Weak, worn out   Telithromycin Nausea And Vomiting   Tramadol  Nausea Only   Hydrocodone-Acetaminophen Rash   Statins Other (See Comments)    Leg cramps and makes patient feel bad. Per pt tried multiple in years past   Sulfonamide Derivatives Rash   Family History  Problem Relation Age of Onset   Heart disease Mother    Hyperlipidemia Mother    Heart disease Father    Hyperlipidemia Father    Diabetes Brother    Colon cancer Neg Hx    Pancreatic cancer Neg  Hx    Esophageal cancer Neg Hx    Stomach cancer Neg Hx    Liver cancer Neg Hx      Current Outpatient Medications (Cardiovascular):    Alirocumab (PRALUENT) 150 MG/ML SOAJ, INJECT 1 DOSE INTO THE SKIN EVERY 14 (FOURTEEN) DAYS.   amLODipine (NORVASC) 5 MG tablet, TAKE 1 TABLET (5 MG TOTAL) BY MOUTH DAILY.   EPINEPHrine 0.3 mg/0.3 mL IJ SOAJ injection, Inject 0.3 mg into the muscle as needed for anaphylaxis. for allergic reaction   ezetimibe (ZETIA) 10 MG tablet, TAKE 1 TABLET BY MOUTH EVERY DAY   hydrochlorothiazide (HYDRODIURIL) 25 MG tablet, TAKE 1 TABLET BY MOUTH EVERY DAY   nitroGLYCERIN (NITROSTAT) 0.4 MG SL tablet, PLACE 1 TABLET UNDER THE TONGUE EVERY 5 (FIVE) MINUTES AS NEEDED.   valsartan (DIOVAN) 320 MG tablet, TAKE 1 TABLET BY MOUTH EVERY DAY  Current Outpatient Medications (Respiratory):    albuterol (PROAIR HFA) 108 (90 Base) MCG/ACT inhaler, Inhale 1-2 puffs into the lungs every 6 (six) hours as needed for wheezing or shortness of breath.   ARNUITY ELLIPTA 200 MCG/ACT AEPB, Inhale 1 puff into the lungs daily.   fluticasone (FLONASE) 50 MCG/ACT nasal spray, Place 1 spray into both nostrils daily.  Current Outpatient Medications (Analgesics):    acetaminophen (TYLENOL) 500 MG tablet, Take 1,000 mg by mouth every 6 (six) hours as needed for moderate pain or headache.   allopurinol (ZYLOPRIM) 100 MG tablet, Take 1 tablet (100 mg total) by mouth daily. History of gout, this lowers uric acid and risk of recurrence   aspirin EC 81 MG tablet, Take 1 tablet (81 mg total) by mouth 2  (two) times daily after a meal.   traMADol (ULTRAM) 50 MG tablet, Take 1 tablet (50 mg total) by mouth every 6 (six) hours as needed (no driving 6 hours after taking and do not take within 6 hours of diazepam).  Current Outpatient Medications (Hematological):    Cyanocobalamin (B-12) 2500 MCG TABS, Take 1,250 mcg by mouth daily.   folic acid (FOLVITE) 800 MCG tablet, Take 800 mcg by mouth daily.   Current Outpatient Medications (Other):    cholecalciferol (VITAMIN D3) 25 MCG (1000 UNIT) tablet, Take 1,000 Units by mouth daily.   clobetasol cream (TEMOVATE) 0.05 %, APPLY TO AFFECTED AREA TWICE A DAY See Dr. Durene Cal soon and discuss about further refills.   diazepam (VALIUM) 5 MG tablet, TAKE 1 TABLET (5 MG TOTAL) BY MOUTH AT BEDTIME AS NEEDED (SLEEP).   fluconazole (DIFLUCAN) 150 MG tablet, Take 1 tablet (150 mg total) by mouth every 3 (three) days.   gabapentin (NEURONTIN) 300 MG capsule, Take 2 cap in AM, 1 cap at noon, 2caps at bedtime   Glucos-Chond-Hyal Ac-Ca Fructo (MOVE FREE JOINT HEALTH ADVANCE PO), Take 1 tablet by mouth daily.   Miconazole Nitrate (LOTRIMIN AF DEODORANT POWDER) 2 % AERP, Apply 1 spray topically daily as needed (jock itch).   mirabegron ER (MYRBETRIQ) 50 MG TB24 tablet, Take 1 tablet (50 mg total) by mouth daily.   nystatin (MYCOSTATIN/NYSTOP) powder, Apply 1 Application topically 3 (three) times daily.   nystatin ointment (MYCOSTATIN), Apply 1 Application topically 2 (two) times daily.   pantoprazole (PROTONIX) 40 MG tablet, TAKE 1 TABLET BY MOUTH EVERY DAY   polyethylene glycol powder (GLYCOLAX/MIRALAX) powder, Take 17 g by mouth daily. (Patient taking differently: Take 17 g by mouth in the morning and at bedtime.)   Probiotic Product (PROBIOTIC PO), Take by mouth in the morning.  Wheat Dextrin (BENEFIBER) POWD, Take 1 Dose by mouth daily. 1 dose = 2 teaspoons   Reviewed prior external information including notes and imaging from  primary care provider As well as  notes that were available from care everywhere and other healthcare systems.  Past medical history, social, surgical and family history all reviewed in electronic medical record.  No pertanent information unless stated regarding to the chief complaint.   Review of Systems:  No headache, visual changes, nausea, vomiting, diarrhea, constipation, dizziness, abdominal pain, skin rash, fevers, chills, night sweats, weight loss, swollen lymph nodes, body aches, joint swelling, chest pain, shortness of breath, mood changes. POSITIVE muscle aches  Objective  Height 5\' 6"  (1.676 m).   General: No apparent distress alert and oriented x3 mood and affect normal, dressed appropriately.  Right wrist exam does have some swelling noted over the distal radius area.  Positive Finklestein's test    Impression and Recommendations:    The above documentation has been reviewed and is accurate and complete Judi Saa, DO

## 2022-11-03 ENCOUNTER — Encounter: Payer: PPO | Attending: Physical Medicine & Rehabilitation | Admitting: Physical Medicine & Rehabilitation

## 2022-11-03 ENCOUNTER — Encounter: Payer: Self-pay | Admitting: *Deleted

## 2022-11-03 ENCOUNTER — Encounter: Payer: Self-pay | Admitting: Physical Medicine & Rehabilitation

## 2022-11-03 ENCOUNTER — Other Ambulatory Visit: Payer: Self-pay | Admitting: Oncology

## 2022-11-03 VITALS — BP 138/73 | HR 65 | Ht 66.0 in | Wt 211.0 lb

## 2022-11-03 DIAGNOSIS — G8928 Other chronic postprocedural pain: Secondary | ICD-10-CM | POA: Insufficient documentation

## 2022-11-03 DIAGNOSIS — Z006 Encounter for examination for normal comparison and control in clinical research program: Secondary | ICD-10-CM

## 2022-11-03 NOTE — Progress Notes (Signed)
Subjective:    Patient ID: Joshua [name], male    DOB: 1941-03-07, 82 y.o.   MRN: [id number]  HPI  CC:  Left knee pain   82 year old male with polyarthritis /osteoarthritis referred by sports medicine for multiple pain issues. Left total knee in 2022 but does have chronic post op pain starting immediately after procedure.  Has achieved good ROM and good function.  Does not require assistive device to ambulate.  Pain is described as sharp, stabbing, tingling, aching.  6-7 out of 10 although interfering with activity at a moderate level.  Pain is worse in the morning and daytime.  Worse with walking and standing improves with rest therapy and injections.  He can walk about 10 minutes at a time he climbs steps he drives he is retired.  He is not doing any regular exercise program, tried going back to the gym but hurt his right wrist while doing tricep pull downs on a cable machine  PRP right knee 1 week ago has f/u wih Dr Katrinka Blazing in Sept.  The right knee is starting to feel better.  He states he is resting it.  Hurt RIght wrist when lifting weights, using voltaren gel as well as a thumb spica splint  Takes tylenol for pain   Groin/Testicular pain seen by [facility] ,   Left hip improved after trochanteric bursa injection.  This   MRI LUMBAR SPINE WITHOUT CONTRAST   TECHNIQUE: Multiplanar, multisequence MR imaging of the lumbar spine was performed. No intravenous contrast was administered.   COMPARISON:  MRI lumbar spine dated July 01, 2017. Radiographs dated March 28, 2021   FINDINGS: Segmentation:  Standard.   Alignment:  Approximately 6 mm anterolisthesis of L4.   Vertebrae: No fracture, evidence of discitis, or bone lesion. Facet joint arthropathy with marrow edema about the facet joints, left worse than the right at L4-L5.   Conus medullaris and cauda equina: Conus extends to the L1 level. Conus and cauda equina appear normal.   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   Disc levels:   T12-L1: No significant disc bulge. No neural foraminal stenosis. No central canal stenosis.   L1-L2: No significant disc bulge. No neural foraminal stenosis. No central canal stenosis. Mild facet joint arthropathy.   L2-L3: No significant disc bulge. No neural foraminal stenosis. No central canal stenosis. Bilateral facet joint arthropathy, right greater than the left.   L3-L4: Mild circumferential disc protrusion and ligamentum flavum hypertrophy with narrowing of spinal canal. No significant neural foraminal narrowing. Moderate bilateral facet joint arthropathy.   L4-L5: Anterolisthesis of L4. Circumferential disc protrusion and moderate ligamentum flavum hypertrophy with narrowing of spinal canal and narrowing of bilateral lateral recesses. Severe bilateral facet joint arthropathy. Moderate right and mild left neural foraminal narrowing.   L5-S1: Ligamentum flavum hypertrophy with asymmetric left disc protrusion with narrowing of left lateral recess. Moderate bilateral facet joint arthropathy with mild right and moderate left neural foraminal narrowing.   IMPRESSION: 1. 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 Bilateral neural foraminal narrowing at L4-L5 and L5-S1 secondary to advanced facet joint arthropathy.   2. 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   3.  Grade 1 anterolisthesis of L4, unchanged.   4. 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 Follow-up with annual abdominal sonogram is recommended.     Electronically Signed   By: Larose Hires D.O.   On: 05/24/2021 19:58 Pain Inventory Average Pain 6 Pain Right Now 7 My pain is  sharp, stabbing, tingling, and aching  In the last 24 hours, has pain interfered with the following? General activity 3 Relation with others 0 Enjoyment of life 0 What TIME of day is your pain at its worst? morning  and daytime Sleep (in general) NA  Pain is worse with: walking and standing Pain improves with: rest, therapy/exercise, and injections Relief from Meds:  did not specify  walk without assistance how many minutes can you walk? 10 ability to climb steps?  yes do you drive?  yes  retired  bladder control problems weakness trouble walking spasms dizziness confusion depression loss of taste or smell  Any changes since last visit?  yes  Any changes since last visit?  no    Family History  Problem Relation Age of Onset   Heart disease Mother    Hyperlipidemia Mother    Heart disease Father    Hyperlipidemia Father    Diabetes Brother    Joshua cancer Neg Hx    Pancreatic cancer Neg Hx    Esophageal cancer Neg Hx    Stomach cancer Neg Hx    Liver cancer Neg Hx    Social History   Socioeconomic History   Marital status: Married    Spouse name: Not on file   Number of children: Not on file   Years of education: Not on file   Highest education level: Associate degree: occupational, Scientist, product/process development, or vocational program  Occupational History   Occupation: retired    Associate Professor: RETIRED  Tobacco Use   Smoking status: Former    Current packs/day: 0.00    Average packs/day: 1 pack/day for 35.0 years (35.0 ttl pk-yrs)    Types: Cigarettes    Start date: 06/16/1957    Quit date: 06/16/1992    Years since quitting: 30.4   Smokeless tobacco: Never  Vaping Use   Vaping status: Never Used  Substance and Sexual Activity   Alcohol use: Yes    Alcohol/week: 2.0 standard drinks of alcohol    Types: 2 Shots of liquor per week    Comment: occ   Drug use: No   Sexual activity: Not Currently    Partners: Female  Other Topics Concern   Not on file  Social History  Narrative   Married 42 years in 2015. 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      Hobbies: poker, movies and tv, staying active      Right handed   2 level home with steps he uses   Social Determinants of Corporate investment banker Strain: Low Risk  (07/10/2022)   Overall Financial Resource Strain (CARDIA)    Difficulty of Paying Living Expenses: Not hard at all  Food Insecurity: No Food Insecurity (07/10/2022)   Hunger Vital Sign    Worried About Running Out of Food in the Last Year: Never true    Ran Out of Food in the Last Year: Never true  Transportation Needs: No Transportation Needs (07/10/2022)   PRAPARE - Administrator, Civil Service (Medical): No    Lack of Transportation (Non-Medical): No  Physical Activity:  Insufficiently Active (07/10/2022)   Exercise Vital Sign    Days of Exercise per Week: 1 day    Minutes of Exercise per Session: 10 min  Stress: No Stress Concern Present (07/10/2022)   [employer] of Occupational Health - Occupational Stress Questionnaire    Feeling of Stress : Only a little  Social Connections: Moderately Isolated (07/10/2022)   Social Connection and Isolation Panel [NHANES]    Frequency of Communication with Friends and Family: More than three times a week    Frequency of Social Gatherings with Friends and Family: Three times a week    Attends Religious Services: Never    Active Member of Clubs or Organizations: No    Attends Engineer, structural: Never    Marital Status: Married   Past Surgical History:  Procedure Laterality Date   ANTERIOR CERVICAL DECOMP/DISCECTOMY FUSION  05/26/2006   PLATE PLACED   BACK SURGERY     CARDIAC CATHETERIZATION  11/20/1999   CORONARY ARTERY BYPASS GRAFT  11/20/1999   SVG-RI1-RI2, SVG-OM, SVG-dRCA   CORONARY STENT INTERVENTION N/A 04/13/2019   Procedure: CORONARY STENT INTERVENTION;  Surgeon: Kathleene Hazel, MD;  Location: [facility];  Service: Cardiovascular;  Laterality: N/A;   HERNIA REPAIR     INGUINAL HERNIA REPAIR Left 01/02/2019   Procedure: OPEN LEFT INGUINAL HERNIA REPAIR WITH MESH;  Surgeon: Luretha Murphy, MD;  Location: [facility];  Service: General;  Laterality: Left;   KNEE ARTHROSCOPY Right 1992; 04/2008   LAPAROSCOPIC CHOLECYSTECTOMY  2007   LEFT HEART CATH AND CORS/GRAFTS ANGIOGRAPHY N/A 04/13/2019   Procedure: LEFT HEART CATH AND CORS/GRAFTS ANGIOGRAPHY;  Surgeon: Kathleene Hazel, MD;  Location: [facility];  Service: Cardiovascular;  Laterality: N/A;   NASAL SEPTUM SURGERY Right 2007   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   ROBOT ASSISTED LAPAROSCOPIC RADICAL PROSTATECTOMY  04/2010   THYROIDECTOMY, PARTIAL  1980   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   Procedure: LEFT TOTAL KNEE ARTHROPLASTY;  Surgeon: Marcene Corning, MD;  Location: [facility];  Service: Orthopedics;  Laterality: Left;   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"   CAD (coronary artery disease)    CABG 2001   Diverticulosis    DJD (degenerative joint disease)    BACK   GERD (gastroesophageal reflux disease)    H/O inguinal hernia repair 12/2018   History of gout    Hyperlipidemia    Hypertension    IBS (irritable bowel syndrome)    Obesity    OSA (obstructive sleep apnea)    USES CPAP    Pancreatitis ~ 1980   Peripheral vascular disease (HCC)    Pre-diabetes    Prostate cancer (HCC) 04/2010   BP 138/73   Pulse 65   Ht 5\' 6"  (1.676 m)   Wt 211 lb (95.7 kg)   SpO2 94%   BMI 34.06 kg/m   Opioid Risk Score:   Fall Risk Score:  `1  Depression screen PHQ 2/9     11/03/2022   10:10 AM 09/18/2022    7:59 AM 09/02/2022    9:19 AM 06/04/2022    8:41 AM 04/02/2022    9:19 AM  01/01/2022    9:24 AM 10/01/2021    8:58  AM  Depression screen PHQ 2/9  Decreased Interest 2 0 0 0 1 0 1  Down, Depressed, Hopeless 1 0 0 1 1 0 1  PHQ - 2 Score 3 0 0 1 2 0 2  Altered sleeping 0 0  0 0 0 0  Tired, decreased energy 2 0  0 2 0 2  Change in appetite 2 0  0 0 0 0  Feeling bad or failure about yourself  0 0  0 0 0 2  Trouble concentrating 3 0  0 0 0 0  Moving slowly or fidgety/restless 1 0  0 0 0 0  Suicidal thoughts 1 0  0 0 0 0  PHQ-9 Score 12 0  1 4 0 6  Difficult doing work/chores Somewhat difficult Not difficult at all  Not difficult at all Not difficult at all Not difficult at all Not difficult at all     Review of Systems  Constitutional:  Positive for unexpected weight change.       Weight gain  HENT: Negative.    Eyes: Negative.   Respiratory:  Positive for apnea.   Cardiovascular:  Positive for leg swelling.  Genitourinary:  Positive for difficulty urinating and dysuria.  Musculoskeletal:  Positive for arthralgias and gait problem.       Spasms  Skin: Negative.   Allergic/Immunologic: Positive for environmental allergies.       Many med allergies  Neurological:  Positive for dizziness and weakness.  Hematological:  Bruises/bleeds easily.  Psychiatric/Behavioral:  Positive for confusion and dysphoric mood.   All other systems reviewed and are negative.      Objective:   Physical Exam Vitals and nursing note reviewed.  Constitutional:      Appearance: He is obese.  HENT:     Head: Normocephalic.  Eyes:     Extraocular Movements: Extraocular movements intact.     Conjunctiva/sclera: Conjunctivae normal.     Pupils: Pupils are equal, round, and reactive to light.  Musculoskeletal:        General: Tenderness present. No swelling or deformity.     Right lower leg: No edema.     Left lower leg: No edema.     Comments: Left knee with tenderness around the medial joint line no pain with lateral joint line palpation no pain around the patellar area.  No  suprapatellar effusion.  No pain with knee range of motion Full range of motion bilateral knees. Ambulates without assistive device no evidence of toe drag or knee instability  No tenderness palpation lumbar paraspinal area has 75% range of motion lumbar flexion and extension no directional pain.  Skin:    General: Skin is warm and dry.  Neurological:     Mental Status: He is alert and oriented to person, place, and time.     Comments: Sensation intact to light touch bilateral lower extremities Negative straight leg raise bilaterally Motor strength is 5/5 bilateral hip flexor knee extensor ankle dorsiflexor and plantar flexor gait as a MSK exam  Psychiatric:        Mood and Affect: Mood normal.        Behavior: Behavior normal.   Right PIP joint pain, no erythema or joint swelling.  No wrist joint effusions bilaterally        Assessment & Plan:   #1.  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  He is unable to get intra-articular injections.  He is allergic to multiple narcotic analgesics  including oxycodone, codeine, hydrocodone and tramadol.  He also is allergic to duloxetine.  He has been through physical therapy. He is taking acetaminophen for pain but this is giving incomplete relief. We discussed treatment options including a trial of genicular nerve blocks and if this produces at least a 50% temporary relief, proceed to left genicular RF.  2.  Polyarthritis with chronic bilateral knee pain he is getting some relief he thinks thus far from right knee PRP although it is early to say he has also been limiting his weightbearing on that side.  In addition he does have right wrist thumb tenosynovitis, hand OA, as well as lumbar spine facet joint arthropathy.  We discussed that due to multiple drug allergies he has limited options in terms of pharmacologic management.  He may tolerate buprenorphine and this would require a trial of a patch.  At this  point he does not wish to pursue this.

## 2022-11-06 ENCOUNTER — Ambulatory Visit: Payer: PPO | Admitting: Primary Care

## 2022-11-06 ENCOUNTER — Encounter: Payer: Self-pay | Admitting: Primary Care

## 2022-11-06 VITALS — BP 118/72 | HR 67 | Temp 97.2°F | Ht 66.5 in | Wt 210.0 lb

## 2022-11-06 DIAGNOSIS — G4733 Obstructive sleep apnea (adult) (pediatric): Secondary | ICD-10-CM

## 2022-11-06 NOTE — Patient Instructions (Addendum)
Excellent compliance with CPAP Sleep apnea is well-controlled on current pressure settings No changes recommended today  I would call your insurance and ask for inhaled steriod (ICS) is formulary (covered) under your plan - then if it differs from what you are currently on we can send a new prescription in   Recommendations:  Continue to wear CPAP nightly for 4 to 6 hours or longer Continue to work on weight loss efforts as able Do not drive if experiencing excessive daytime sleepiness fatigue  Orders: New DME for CPAP supplies (not [facility])   Follow-up: 1 year with [facility] NP or sooner if needed

## 2022-11-06 NOTE — Assessment & Plan Note (Signed)
-   Well-controlled on inhaled corticosteroid.  Rare to no [place] use.  Following with asthma/allergy.  Recently switched from Flovent to Arnuity due to insurance coverage, however, inhaler still costing him more than $100.  Advised he reach out to his insurance and find out what ICS is formulary on plan.

## 2022-11-06 NOTE — Progress Notes (Signed)
@Patient  ID: Joshua [name], male    DOB: 1940-09-02, 82 y.o.   MRN: [id number]  Chief Complaint  Patient presents with   Follow-up    OSA on CPAP ACT 25     Referring provider: Shelva Majestic, MD  HPI: 82 year old male, former smoker quit 1994.  Past medical history significant for coronary artery disease, hypertension, aortic arthrosclerosis, OSA, asthma, allergic rhinitis, GERD, chronic kidney disease, hyperlipidemia, obesity.  Patient of Dr. Craige Cotta, last seen on 06/10/22.  Previous [facility] pulmonary encounter:  06/10/22- Dr. Craige Cotta  He uses CPAP nightly.  Still has trouble with mask leak if he doesn't tighten mask.  His cheeks will start to get sore after about 6 to 7 hours of CPAP use.  He takes mask off and sleeps for another hour.  He has been to sleep lab twice to get mask refitting done.  He isn't able to use nasal interfaces due to mask leak.    11/06/2022- Interim hx  Patient presents today for annual follow-up.  He is 100% compliant with CPAP use, average usage 6 hours 11 minutes per night.  Current pressure 6-12 cm H2O with residual AHI 2.4/hour.  He would like to change DME companies for CPAP supplies.  He is currently with [facility].  Asthma symptoms are well controlled. Follows with asthma and allergy.  He was changed to Arnuity due to cost of Flovent. Inhaler is still costing him >100 dollars. He uses Albuterol average once a year.   Airview download 10/06/2022 - 11/04/2022 Usage days 30/30 days (100%); 26 days (87%) greater than 4 hours Average usage 6 hours 11 minutes Pressure 6 to 12 cm H2O (11.3 cm H2O-95%) Air leak 6.2 L/min (95%) AHI 2.4   Allergies  Allergen Reactions   Benadryl [Diphenhydramine] Other (See Comments)    Pt told not to take because of bypass surgery   Bromfed Nausea And Vomiting   Clarithromycin Other (See Comments)     gastritis   Codeine Other (See Comments)    Trouble breathing   Cymbalta [Duloxetine Hcl]     Abdominal pain and later  chest pain as well as worsening fatigue    Doxycycline Hyclate Nausea And Vomiting   Lexapro [Escitalopram]     Dizziness   Modafinil Other (See Comments)     anxiety-nervousness   Oxycodone-Acetaminophen Itching   Promethazine Hcl Other (See Comments)     fainting   Quinolones Nausea Only    Cipro "felt real bad"   Repatha [Evolocumab]     Weak, worn out   Telithromycin Nausea And Vomiting   Tramadol Nausea Only   Hydrocodone-Acetaminophen Rash   Statins Other (See Comments)    Leg cramps and makes patient feel bad. Per pt tried multiple in years past   Sulfonamide Derivatives Rash    Immunization History  Administered Date(s) Administered   COVID-19, mRNA, vaccine(Comirnaty)12 years and older 01/29/2022   Fluad Quad(high Dose 65+) 11/21/2018, 12/11/2020, 12/18/2021   Influenza Split 01/08/2012, 11/12/2014   Influenza Whole 12/08/2007, 12/26/2008, 12/22/2010   Influenza, High Dose Seasonal PF 11/16/2015, 01/02/2016, 01/01/2017, 01/06/2018, 02/01/2019, 11/24/2019, 01/23/2020, 01/21/2021   Influenza,inj,Quad PF,6+ Mos 11/30/2012   Influenza-Unspecified 01/12/2014, 12/21/2017   PFIZER Comirnaty(Gray Top)Covid-19 Tri-Sucrose Vaccine 07/29/2020, 01/29/2022   PFIZER(Purple Top)SARS-COV-2 Vaccination 04/28/2019, 05/23/2019, 12/21/2019, 01/23/2020   PNEUMOCOCCAL CONJUGATE-20 09/26/2021   Pfizer Covid-19 Vaccine Bivalent Booster 76yrs & up 12/11/2020   Pneumococcal Conjugate-13 05/31/2014   Pneumococcal Polysaccharide-23 06/04/2006, 07/15/2012, 01/02/2016, 12/31/2016, 01/23/2020, 01/21/2021   Respiratory Syncytial Virus  Vaccine,Recomb Aduvanted(Arexvy) 02/27/2022   Td 04/23/2005, 05/13/2021   Tdap 02/18/2016   Zoster Recombinant(Shingrix) 01/31/2018, 04/02/2018   Zoster, Live 01/08/2012    Past Medical History:  Diagnosis Date   Acute medial meniscal tear    Anemia 2015   Arthritis    "middle finger right hand; right knee; neck" (02/06/2014)   Asthma    seasonal   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"   CAD (coronary artery disease)    CABG 2001   Diverticulosis    DJD (degenerative joint disease)    BACK   GERD (gastroesophageal reflux disease)    H/O inguinal hernia repair 12/2018   History of gout    Hyperlipidemia    Hypertension    IBS (irritable bowel syndrome)    Obesity    OSA (obstructive sleep apnea)    USES CPAP    Pancreatitis ~ 1980   Peripheral vascular disease (HCC)    Pre-diabetes    Prostate cancer (HCC) 04/2010    Tobacco History: Social History   Tobacco Use  Smoking Status Former   Current packs/day: 0.00   Average packs/day: 1 pack/day for 35.0 years (35.0 ttl pk-yrs)   Types: Cigarettes   Start date: 06/16/1957   Quit date: 06/16/1992   Years since quitting: 30.4  Smokeless Tobacco Never   Counseling given: Not Answered   Outpatient Medications Prior to Visit  Medication Sig Dispense Refill   acetaminophen (TYLENOL) 500 MG tablet Take 1,000 mg by mouth every 6 (six) hours as needed for moderate pain or headache.     albuterol (PROAIR HFA) 108 (90 Base) MCG/ACT inhaler Inhale 1-2 puffs into the lungs every 6 (six) hours as needed for wheezing or shortness of breath. 18 g 5   Alirocumab (PRALUENT) 150 MG/ML SOAJ INJECT 1 DOSE INTO THE SKIN EVERY 14 (FOURTEEN) DAYS. 6 mL 3   allopurinol (ZYLOPRIM) 100 MG tablet Take 1 tablet (100 mg total) by mouth daily. History of gout, this lowers uric acid and risk of recurrence 90 tablet 3   amLODipine (NORVASC) 5 MG tablet TAKE 1 TABLET (5 MG TOTAL) BY MOUTH DAILY. 90 tablet 3   ARNUITY ELLIPTA 200 MCG/ACT AEPB Inhale 1 puff into the lungs daily.     aspirin EC 81 MG tablet Take 1 tablet (81 mg total) by mouth 2 (two) times daily after a meal. 30 tablet 11   cholecalciferol (VITAMIN D3) 25 MCG (1000 UNIT) tablet Take 1,000 Units by mouth daily.     clobetasol cream (TEMOVATE) 0.05 % APPLY TO AFFECTED AREA TWICE A DAY See Dr. Durene Cal soon and discuss about  further refills. 30 g 0   Cyanocobalamin (B-12) 2500 MCG TABS Take 1,250 mcg by mouth daily.     diazepam (VALIUM) 5 MG tablet TAKE 1 TABLET (5 MG TOTAL) BY MOUTH AT BEDTIME AS NEEDED (SLEEP). 30 tablet 0   EPINEPHrine 0.3 mg/0.3 mL IJ SOAJ injection Inject 0.3 mg into the muscle as needed for anaphylaxis. for allergic reaction     ezetimibe (ZETIA) 10 MG tablet TAKE 1 TABLET BY MOUTH EVERY DAY 90 tablet 2   fluconazole (DIFLUCAN) 150 MG tablet Take 1 tablet (150 mg total) by mouth every 3 (three) days. 10 tablet 2   fluticasone (FLONASE) 50 MCG/ACT nasal spray Place 1 spray into both nostrils daily.     folic acid (FOLVITE) 800 MCG tablet Take 800 mcg by mouth daily.      gabapentin (NEURONTIN) 300  MG capsule Take 2 cap in AM, 1 cap at noon, 2caps at bedtime 450 capsule 3   Glucos-Chond-Hyal Ac-Ca Fructo (MOVE FREE JOINT HEALTH ADVANCE PO) Take 1 tablet by mouth daily.     hydrochlorothiazide (HYDRODIURIL) 25 MG tablet TAKE 1 TABLET BY MOUTH EVERY DAY 90 tablet 2   Miconazole Nitrate (LOTRIMIN AF DEODORANT POWDER) 2 % AERP Apply 1 spray topically daily as needed (jock itch).     mirabegron ER (MYRBETRIQ) 50 MG TB24 tablet Take 1 tablet (50 mg total) by mouth daily. 90 tablet 1   nitroGLYCERIN (NITROSTAT) 0.4 MG SL tablet PLACE 1 TABLET UNDER THE TONGUE EVERY 5 (FIVE) MINUTES AS NEEDED. 75 tablet 1   nystatin (MYCOSTATIN/NYSTOP) powder Apply 1 Application topically 3 (three) times daily. 60 g 11   nystatin ointment (MYCOSTATIN) Apply 1 Application topically 2 (two) times daily. 30 g 3   pantoprazole (PROTONIX) 40 MG tablet TAKE 1 TABLET BY MOUTH EVERY DAY 90 tablet 3   polyethylene glycol powder (GLYCOLAX/MIRALAX) powder Take 17 g by mouth daily. (Patient taking differently: Take 17 g by mouth in the morning and at bedtime.) 255 g 0   Probiotic Product (PROBIOTIC PO) Take by mouth in the morning.     traMADol (ULTRAM) 50 MG tablet Take 1 tablet (50 mg total) by mouth every 6 (six) hours as needed  (no driving 6 hours after taking and do not take within 6 hours of diazepam). 20 tablet 1   valsartan (DIOVAN) 320 MG tablet TAKE 1 TABLET BY MOUTH EVERY DAY 90 tablet 3   Wheat Dextrin (BENEFIBER) POWD Take 1 Dose by mouth daily. 1 dose = 2 teaspoons     No facility-administered medications prior to visit.      Review of Systems  Review of Systems  Constitutional: Negative.   HENT: Negative.    Respiratory: Negative.  Negative for cough, shortness of breath and wheezing.   Cardiovascular: Negative.      Physical Exam  BP 118/72 (BP Location: Left Arm, Patient Position: Sitting, Cuff Size: Normal)   Pulse 67   Temp (!) 97.2 F (36.2 C) (Temporal)   Ht 5' 6.5" (1.689 m)   Wt 210 lb (95.3 kg)   SpO2 97%   BMI 33.39 kg/m  Physical Exam Constitutional:      Appearance: Normal appearance.  HENT:     Head: Normocephalic and atraumatic.     Mouth/Throat:     Mouth: Mucous membranes are moist.     Pharynx: Oropharynx is clear.  Cardiovascular:     Rate and Rhythm: Normal rate and regular rhythm.  Pulmonary:     Effort: Pulmonary effort is normal.     Breath sounds: Normal breath sounds.  Musculoskeletal:        General: Normal range of motion.  Skin:    General: Skin is warm and dry.  Neurological:     General: No focal deficit present.     Mental Status: He is alert and oriented to person, place, and time. Mental status is at baseline.  Psychiatric:        Mood and Affect: Mood normal.        Behavior: Behavior normal.        Thought Content: Thought content normal.        Judgment: Judgment normal.      Lab Results:  CBC    Component Value Date/Time   WBC 7.5 09/02/2022 0955   RBC 4.45 09/02/2022 0955   HGB  13.8 09/02/2022 0955   HGB 14.8 12/23/2021 1552   HCT 41.0 09/02/2022 0955   HCT 42.8 12/23/2021 1552   PLT 225.0 09/02/2022 0955   PLT 274 12/23/2021 1552   MCV 92.2 09/02/2022 0955   MCV 89 12/23/2021 1552   MCH 30.6 12/23/2021 1552   MCH 30.3  08/11/2020 0044   MCHC 33.7 09/02/2022 0955   RDW 13.0 09/02/2022 0955   RDW 11.9 12/23/2021 1552   LYMPHSABS 1.2 09/02/2022 0955   MONOABS 0.8 09/02/2022 0955   EOSABS 0.3 09/02/2022 0955   BASOSABS 0.0 09/02/2022 0955    BMET    Component Value Date/Time   NA 136 09/18/2022 0835   NA 140 12/23/2021 1552   K 3.8 09/18/2022 0835   CL 102 09/18/2022 0835   CO2 27 09/18/2022 0835   GLUCOSE 114 (H) 09/18/2022 0835   BUN 22 09/18/2022 0835   BUN 21 12/23/2021 1552   CREATININE 1.40 09/18/2022 0835   CREATININE 1.33 (H) 02/23/2020 0933   CALCIUM 9.6 09/18/2022 0835   GFRNONAA 52 (L) 08/11/2020 0044   GFRNONAA 51 (L) 02/23/2020 0933   GFRAA 59 (L) 02/23/2020 0933    BNP    Component Value Date/Time   BNP 32 10/01/2017 1703    ProBNP No results found for: "PROBNP"  Imaging: [place] LIMITED JOINT SPACE STRUCTURES LOW RIGHT(NO LINKED CHARGES)  Result Date: 10/29/2022 No images saved   DG Hand Complete Right  Result Date: 10/23/2022 CLINICAL DATA:  Intermittent right hand and wrist pain. EXAM: RIGHT HAND - COMPLETE 3+ VIEW COMPARISON:  None Available. FINDINGS: No fracture.  No bone lesion. Narrowed third metacarpophalangeal joint with small marginal osteophytes. Remaining joints normally spaced and aligned. Soft tissues are unremarkable. IMPRESSION: 1. No fracture or dislocation. 2. Mild arthropathic changes at the third metacarpophalangeal joint. No periarticular erosions. Electronically Signed   By: Amie Portland M.D.   On: 10/23/2022 16:02     Assessment & Plan:   OSA (obstructive sleep apnea) - Well controlled; Patient has mild obstructive sleep apnea.  Maintained on auto CPAP.  He is 100% compliant with use.  Current pressure 6 to 12 cm H2O; residual AHI 2.4/hour.  No changes recommended today to pressure settings.  We will place an order for patient to establish with new DME company for CPAP supplies.  Encourage patient continue her CPAP nightly 4 to 6 hours or longer.   Advised patient continue weight loss efforts and avoid driving if experiencing excessive daytime sleepiness fatigue.  Follow-up 1 year or sooner if needed.  Asthma - Well-controlled on inhaled corticosteroid.  Rare to no [place] use.  Following with asthma/allergy.  Recently switched from Flovent to Arnuity due to insurance coverage, however, inhaler still costing him more than $100.  Advised he reach out to his insurance and find out what ICS is formulary on plan.   Glenford Bayley, NP 11/06/2022

## 2022-11-06 NOTE — Assessment & Plan Note (Signed)
-   Well controlled; Patient has mild obstructive sleep apnea.  Maintained on auto CPAP.  He is 100% compliant with use.  Current pressure 6 to 12 cm H2O; residual AHI 2.4/hour.  No changes recommended today to pressure settings.  We will place an order for patient to establish with new DME company for CPAP supplies.  Encourage patient continue her CPAP nightly 4 to 6 hours or longer.  Advised patient continue weight loss efforts and avoid driving if experiencing excessive daytime sleepiness fatigue.  Follow-up 1 year or sooner if needed.

## 2022-11-09 ENCOUNTER — Encounter: Payer: Self-pay | Admitting: Family Medicine

## 2022-11-12 NOTE — Progress Notes (Signed)
Reviewed and agree with assessment/plan.   Coralyn Helling, MD [facility] 11/12/2022, 10:03 AM Pager:  [phone]

## 2022-11-13 DIAGNOSIS — Z8546 Personal history of malignant neoplasm of prostate: Secondary | ICD-10-CM | POA: Diagnosis not present

## 2022-11-17 NOTE — Telephone Encounter (Signed)
Rec'd completed application from patient.  Could an RX for the Arnuity Inhaler (90 day supply w/ refills) be faxed to [phone]? This is needed to submit with application.

## 2022-11-18 ENCOUNTER — Ambulatory Visit: Payer: PPO | Admitting: Family Medicine

## 2022-11-18 MED ORDER — ARNUITY ELLIPTA 200 MCG/ACT IN AEPB: INHALATION_SPRAY | RESPIRATORY_TRACT | 11 refills | Status: DC

## 2022-11-18 MED ORDER — ARNUITY ELLIPTA 200 MCG/ACT IN AEPB: INHALATION_SPRAY | RESPIRATORY_TRACT | 3 refills | Status: DC

## 2022-11-18 NOTE — Telephone Encounter (Signed)
Submitted application for ARNUITY INHALER to [employer] for patient assistance.   Phone: [phone]

## 2022-11-18 NOTE — Telephone Encounter (Signed)
Refill sent.

## 2022-11-18 NOTE — Telephone Encounter (Signed)
Hi Camille, I have gotten this faxed  over to you.

## 2022-11-18 NOTE — Addendum Note (Signed)
Addended by: Gwenette Greet on: 11/18/2022 08:28 AM   Modules accepted: Orders

## 2022-11-19 ENCOUNTER — Telehealth: Payer: Self-pay | Admitting: Family Medicine

## 2022-11-19 ENCOUNTER — Ambulatory Visit: Payer: PPO | Admitting: Family Medicine

## 2022-11-19 ENCOUNTER — Emergency Department (HOSPITAL_BASED_OUTPATIENT_CLINIC_OR_DEPARTMENT_OTHER): Payer: PPO

## 2022-11-19 ENCOUNTER — Other Ambulatory Visit: Payer: Self-pay

## 2022-11-19 ENCOUNTER — Emergency Department (HOSPITAL_BASED_OUTPATIENT_CLINIC_OR_DEPARTMENT_OTHER)
Admission: EM | Admit: 2022-11-19 | Discharge: 2022-11-19 | Disposition: A | Discharge status: Home / Self Care | Payer: PPO | Attending: Emergency Medicine | Admitting: Emergency Medicine

## 2022-11-19 DIAGNOSIS — N433 Hydrocele, unspecified: Secondary | ICD-10-CM | POA: Diagnosis not present

## 2022-11-19 DIAGNOSIS — I10 Essential (primary) hypertension: Secondary | ICD-10-CM | POA: Diagnosis not present

## 2022-11-19 DIAGNOSIS — N5082 Scrotal pain: Secondary | ICD-10-CM | POA: Diagnosis not present

## 2022-11-19 DIAGNOSIS — I2510 Atherosclerotic heart disease of native coronary artery without angina pectoris: Secondary | ICD-10-CM | POA: Insufficient documentation

## 2022-11-19 DIAGNOSIS — R300 Dysuria: Secondary | ICD-10-CM | POA: Insufficient documentation

## 2022-11-19 DIAGNOSIS — Z79899 Other long term (current) drug therapy: Secondary | ICD-10-CM | POA: Diagnosis not present

## 2022-11-19 DIAGNOSIS — N503 Cyst of epididymis: Secondary | ICD-10-CM | POA: Diagnosis not present

## 2022-11-19 LAB — COMPREHENSIVE METABOLIC PANEL
ALT: 24 U/L (ref 0–44)
AST: 19 U/L (ref 15–41)
Albumin: 4 g/dL (ref 3.5–5.0)
Alkaline Phosphatase: 54 U/L (ref 38–126)
Anion gap: 7 (ref 5–15)
BUN: 21 mg/dL (ref 8–23)
CO2: 25 mmol/L (ref 22–32)
Calcium: 9.4 mg/dL (ref 8.9–10.3)
Chloride: 99 mmol/L (ref 98–111)
Creatinine, Ser: 1.12 mg/dL (ref 0.61–1.24)
GFR, Estimated: >60 mL/min (ref >60)
Glucose, Bld: 92 mg/dL (ref 70–99)
Potassium: 4.2 mmol/L (ref 3.5–5.1)
Sodium: 131 mmol/L — ABNORMAL LOW (ref 135–145)
Total Bilirubin: 0.8 mg/dL (ref 0.3–1.2)
Total Protein: 6.4 g/dL — ABNORMAL LOW (ref 6.5–8.1)

## 2022-11-19 LAB — CBC WITH DIFFERENTIAL/PLATELET
Abs Immature Granulocytes: 0.1 10*3/uL — ABNORMAL HIGH (ref 0.00–0.07)
Basophils Absolute: 0 10*3/uL (ref 0.0–0.1)
Basophils Relative: 0 %
Eosinophils Absolute: 0.1 10*3/uL (ref 0.0–0.5)
Eosinophils Relative: 1 %
HCT: 41.3 % (ref 39.0–52.0)
Hemoglobin: 15 g/dL (ref 13.0–17.0)
Immature Granulocytes: 1 %
Lymphocytes Relative: 11 %
Lymphs Abs: 0.9 10*3/uL (ref 0.7–4.0)
MCH: 32.2 pg (ref 26.0–34.0)
MCHC: 36.3 g/dL — ABNORMAL HIGH (ref 30.0–36.0)
MCV: 88.6 fL (ref 80.0–100.0)
Monocytes Absolute: 0.8 10*3/uL (ref 0.1–1.0)
Monocytes Relative: 9 %
Neutro Abs: 6.8 10*3/uL (ref 1.7–7.7)
Neutrophils Relative %: 78 %
Platelets: 203 10*3/uL (ref 150–400)
RBC: 4.66 MIL/uL (ref 4.22–5.81)
RDW: 12.1 % (ref 11.5–15.5)
WBC: 8.7 10*3/uL (ref 4.0–10.5)
nRBC: 0 % (ref 0.0–0.2)

## 2022-11-19 LAB — URINALYSIS, ROUTINE W REFLEX MICROSCOPIC
Bilirubin Urine: NEGATIVE
Glucose, UA: NEGATIVE mg/dL
Hgb urine dipstick: NEGATIVE
Ketones, ur: NEGATIVE mg/dL
Leukocytes,Ua: NEGATIVE
Nitrite: NEGATIVE
Protein, ur: NEGATIVE mg/dL
Specific Gravity, Urine: 1.01 (ref 1.005–1.030)
pH: 6.5 (ref 5.0–8.0)

## 2022-11-19 NOTE — Telephone Encounter (Signed)
FYI: This call has been transferred to triage nurse: the Triage Nurse. Once the result note has been entered staff can address the message at that time.  Patient called in with the following symptoms:  Red Word: Confusion, 2 severe headaches, & severe constant pain   Please advise at [facility] [phone]  Message is routed to Provider Pool.

## 2022-11-19 NOTE — Telephone Encounter (Signed)
FYI

## 2022-11-19 NOTE — ED Triage Notes (Signed)
Pt c/o scrotal pain for two months. Pt states that he's been having worsening incontinence and painful urination for the last several days. Pt with hx of several UTIs. No recent fevers. Per pt, "my wife says I've been having some confusion the past couple of days".

## 2022-11-19 NOTE — ED Provider Notes (Signed)
Joshua [name] EMERGENCY DEPARTMENT AT [facility] Provider Note   CSN: [id number] Arrival date & time: 11/19/22  2725     History Chief Complaint  Patient presents with   Testicle Pain   Urinary Tract Infection    Joshua [name] is a 82 y.o. male patient with history of coronary artery disease, gout, hyperlipidemia, hypertension who presents to the emergency department today for further evaluation of scrotal pain, dysuria, and incontinence.  Patient states that he has been intermittently incontinent since 2012 this is not a new problem.  He states that he has been having some scrotal pain for the last several months.  He was seen evaluated by urology where he received ketoconazole for tinea cruris which she states has been helping.  Over the last week or so he developed some dysuria while he is been having some incontinence.  His wife also mentions that he has been confused at times.  He denies fever, chills, abdominal pain, flank pain, nausea, vomiting, diarrhea.   Testicle Pain  Urinary Tract Infection      Home Medications Prior to Admission medications   Medication Sig Start Date End Date Taking? Authorizing Provider  acetaminophen (TYLENOL) 500 MG tablet Take 1,000 mg by mouth every 6 (six) hours as needed for moderate pain or headache.    [provider]  albuterol (PROAIR HFA) 108 (90 Base) MCG/ACT inhaler Inhale 1-2 puffs into the lungs every 6 (six) hours as needed for wheezing or shortness of breath. 12/06/21   Dulce Sellar, NP  Alirocumab (PRALUENT) 150 MG/ML SOAJ INJECT 1 DOSE INTO THE SKIN EVERY 14 (FOURTEEN) DAYS. 02/16/22   Hilty, Lisette Abu, MD  allopurinol (ZYLOPRIM) 100 MG tablet Take 1 tablet (100 mg total) by mouth daily. History of gout, this lowers uric acid and risk of recurrence 08/25/22   [name], MD  amLODipine (NORVASC) 5 MG tablet TAKE 1 TABLET (5 MG TOTAL) BY MOUTH DAILY. 04/01/22   Hilty, Lisette Abu, MD  ARNUITY ELLIPTA 200  MCG/ACT AEPB Inhale 1 puff into the lungs daily.    [provider]  aspirin EC 81 MG tablet Take 1 tablet (81 mg total) by mouth 2 (two) times daily after a meal. 06/18/20   Elodia Florence, PA-C  cholecalciferol (VITAMIN D3) 25 MCG (1000 UNIT) tablet Take 1,000 Units by mouth daily.    [provider]  clobetasol cream (TEMOVATE) 0.05 % APPLY TO AFFECTED AREA TWICE A DAY See Dr. Durene Cal soon and discuss about further refills. 04/24/22   [name], MD  Cyanocobalamin (B-12) 2500 MCG TABS Take 1,250 mcg by mouth daily.    [provider]  diazepam (VALIUM) 5 MG tablet TAKE 1 TABLET (5 MG TOTAL) BY MOUTH AT BEDTIME AS NEEDED (SLEEP). 10/22/22   Shelva Majestic, MD  EPINEPHrine 0.3 mg/0.3 mL IJ SOAJ injection Inject 0.3 mg into the muscle as needed for anaphylaxis. for allergic reaction 04/24/19   [provider]  ezetimibe (ZETIA) 10 MG tablet TAKE 1 TABLET BY MOUTH EVERY DAY 05/05/22   Rollene Rotunda, MD  fluconazole (DIFLUCAN) 150 MG tablet Take 1 tablet (150 mg total) by mouth every 3 (three) days. 08/19/22   [name], MD  fluticasone [facility]) 50 MCG/ACT nasal spray Place 1 spray into both nostrils daily. 09/05/12   Stacie Glaze, MD  Fluticasone Furoate (ARNUITY ELLIPTA) 200 MCG/ACT AEPB Inhale 1 puff into the lungs daily. 11/18/22   Shelva Majestic, MD  folic acid Alvira Monday)  800 MCG tablet Take 800 mcg by mouth daily.     [provider]  gabapentin (NEURONTIN) 300 MG capsule Take 2 cap in AM, 1 cap at noon, 2caps at bedtime 04/27/22   Van Clines, MD  Glucos-Chond-Hyal Ac-Ca Fructo (MOVE FREE JOINT HEALTH ADVANCE PO) Take 1 tablet by mouth daily.    [provider]  hydrochlorothiazide (HYDRODIURIL) 25 MG tablet TAKE 1 TABLET BY MOUTH EVERY DAY 05/05/22   Shelva Majestic, MD  Miconazole Nitrate (LOTRIMIN AF DEODORANT POWDER) 2 % AERP Apply 1 spray topically daily as needed (jock itch).    [provider]  mirabegron ER  (MYRBETRIQ) 50 MG TB24 tablet Take 1 tablet (50 mg total) by mouth daily. 10/01/22   Shelva Majestic, MD  nitroGLYCERIN (NITROSTAT) 0.4 MG SL tablet PLACE 1 TABLET UNDER THE TONGUE EVERY 5 (FIVE) MINUTES AS NEEDED. 03/11/20   Rollene Rotunda, MD  nystatin (MYCOSTATIN/NYSTOP) powder Apply 1 Application topically 3 (three) times daily. 08/05/22   [name], MD  nystatin ointment (MYCOSTATIN) Apply 1 Application topically 2 (two) times daily. 08/05/22   [name], MD  pantoprazole (PROTONIX) 40 MG tablet TAKE 1 TABLET BY MOUTH EVERY DAY 07/08/22   Hilty, Lisette Abu, MD  polyethylene glycol powder (GLYCOLAX/MIRALAX) powder Take 17 g by mouth daily. Patient taking differently: Take 17 g by mouth in the morning and at bedtime. 08/29/14   Rolland Porter, MD  Probiotic Product (PROBIOTIC PO) Take by mouth in the morning.    [provider]  traMADol (ULTRAM) 50 MG tablet Take 1 tablet (50 mg total) by mouth every 6 (six) hours as needed (no driving 6 hours after taking and do not take within 6 hours of diazepam). 07/08/22   Shelva Majestic, MD  valsartan (DIOVAN) 320 MG tablet TAKE 1 TABLET BY MOUTH EVERY DAY 04/24/22   Shelva Majestic, MD  Wheat Dextrin (BENEFIBER) POWD Take 1 Dose by mouth daily. 1 dose = 2 teaspoons    [provider]      Allergies    Benadryl [diphenhydramine], Bromfed, Clarithromycin, Codeine, Cymbalta [duloxetine hcl], Doxycycline hyclate, Lexapro [escitalopram], Modafinil, Oxycodone-acetaminophen, Promethazine hcl, Quinolones, Repatha [evolocumab], Telithromycin, Tramadol, Hydrocodone-acetaminophen, Statins, and Sulfonamide derivatives    Review of Systems   Review of Systems  Genitourinary:  Positive for testicular pain.  All other systems reviewed and are negative.   Physical Exam Updated Vital Signs BP (!) 147/81   Pulse 67   Temp 97.8 F (36.6 C) (Oral)   Resp 17   SpO2 98%  Physical Exam Vitals and nursing note reviewed.  Constitutional:       General: He is not in acute distress.    Appearance: Normal appearance.  HENT:     Head: Normocephalic and atraumatic.  Eyes:     General:        Right eye: No discharge.        Left eye: No discharge.  Cardiovascular:     Comments: Regular rate and rhythm.  S1/S2 are distinct without any evidence of murmur, rubs, or gallops.  Radial pulses are 2+ bilaterally.  Dorsalis pedis pulses are 2+ bilaterally.  No evidence of pedal edema. Pulmonary:     Comments: Clear to auscultation bilaterally.  Normal effort.  No respiratory distress.  No evidence of wheezes, rales, or rhonchi heard throughout. Abdominal:     General: Abdomen is flat. Bowel sounds are normal. There is no distension.     Tenderness: There  is no abdominal tenderness. There is no guarding or rebound.  Genitourinary:    Comments: Circumcised male.  Penis appears normal.  Scrotum does not have any lesions or rashes.  There is no erythema.  Testicles are in normal lie and appearance. Musculoskeletal:        General: Normal range of motion.     Cervical back: Neck supple.  Skin:    General: Skin is warm and dry.     Findings: No rash.  Neurological:     General: No focal deficit present.     Mental Status: He is alert.  Psychiatric:        Mood and Affect: Mood normal.        Behavior: Behavior normal.     ED Results / Procedures / Treatments   Labs (all labs ordered are listed, but only abnormal results are displayed) Labs Reviewed  CBC WITH DIFFERENTIAL/PLATELET - Abnormal; Notable for the following components:      Result Value   MCHC 36.3 (*)    Abs Immature Granulocytes 0.10 (*)    All other components within normal limits  COMPREHENSIVE METABOLIC PANEL - Abnormal; Notable for the following components:   Sodium 131 (*)    Total Protein 6.4 (*)    All other components within normal limits  URINALYSIS, ROUTINE W REFLEX MICROSCOPIC    EKG None  Radiology US SCROTUM W/DOPPLER  Result Date:  11/19/2022 CLINICAL DATA:  Two-month history of scrotal pain. EXAM: SCROTAL ULTRASOUND DOPPLER ULTRASOUND OF THE TESTICLES TECHNIQUE: Complete ultrasound examination of the testicles, epididymis, and other scrotal structures was performed. Color and spectral Doppler ultrasound were also utilized to evaluate blood flow to the testicles. COMPARISON:  None Available. FINDINGS: Right testicle Measurements: 5.0 x 4.2 x 2.7 cm. No mass or microlithiasis visualized. Left testicle Measurements: 4.9 x 2.9 x 2.5 cm. No mass or microlithiasis visualized. Right epididymis: Cyst in the epididymal head measures 6 x 5 x 4 mm. Left epididymis:  Normal in size and appearance. Hydrocele:  Small right hydrocele. Varicocele:  Small right varicocele. Pulsed Doppler interrogation of both testes demonstrates normal low resistance arterial and venous waveforms bilaterally. IMPRESSION: 1. Small right hydrocele and varicocele, which is less common than a left-sided varicocele. Consider further evaluation with nonemergent contrast-enhanced CT of the abdomen and pelvis. 2. Right epididymal head cyst measures 6 mm. 3.  No sonographic finding of testicular torsion. Electronically Signed   By: Agustin Cree M.D.   On: 11/19/2022 11:30    Procedures Procedures    Medications Ordered in ED Medications - No data to display  ED Course/ Medical Decision Making/ A&P Clinical Course as of 11/19/22 1220  Thu Nov 19, 2022  1218 CBC with Differential(!) Normal. [CF]  1218 Urinalysis, Routine w reflex microscopic -Urine, Clean Catch Negative. [CF]  1218 Comprehensive metabolic panel(!) Mild hyponatremia.  Otherwise normal. [CF]  1218 US SCROTUM W/DOPPLER I personally ordered and interpreted the study.  There is evidence of a varicocele and hydrocele.  I do agree with the radiologist interpretation. [CF]    Clinical Course User Index [CF] Teressa Lower, PA-C   {   Click here for ABCD2, HEART and other calculators  Medical Decision  Making BRANSYN ZUNICH is a 82 y.o. male patient who presents to the emergency department today for further evaluation of scrotal pain and dysuria.  Will plan to get basic labs, UA, and scrotal ultrasound to further assess.  Patient does not have any evidence of  tinea cruris at this time.  I have a low suspicion for testicular torsion but will be looking for other disease states such as varicocele, epididymitis, urinary tract infection.  His vital signs are normal at this time and he is resting in the emergency department in no acute distress.  No evidence of epididymitis, testicular torsion, or UTI today.  There was evidence of varicocele and hydrocele on ultrasound which could be contributing to his scrotal pain.  He will follow-up in the outpatient setting for a nonemergent CT scan of that area to further assess.  Patient does not meet inpatient criteria at this time.  He will follow-up with his urologist as he does have an appointment here in the next few days.  Strict return precautions were given.  He is safe for discharge.  Amount and/or Complexity of Data Reviewed Labs: ordered. Radiology: ordered.    Final Clinical Impression(s) / ED Diagnoses Final diagnoses:  Scrotal pain  Dysuria    Rx / DC Orders ED Discharge Orders     None         Jolyn Lent 11/19/22 1220    Rondel Baton, MD 11/20/22 [phone]

## 2022-11-19 NOTE — ED Notes (Signed)
Patient verbalizes understanding of discharge instructions. Opportunity for questioning and answers were provided. Patient discharged from ED. IV removed x1

## 2022-11-19 NOTE — Telephone Encounter (Signed)
Patient advised to go to ED.  Patient states he is going to ED.    Patient Name First: Joshua [name] Last: Baar Gender: Male DOB: September 11, 1940 Age: 82 Y 9 M 6 D Return Phone Number: [phone] (Primary), [phone] (Secondary) Address: City/ State/ Zip: [place]  [address] Client [facility] - [facility], [facility] Provider Tana Conch- MD Contact Type Call Who Is Calling Patient / Member / Family / Caregiver Call Type Triage / Clinical Relationship To Patient Self Return Phone Number [phone] (Primary) Chief Complaint CONFUSION - new onset Reason for Call Symptomatic / Request for Health Information Initial Comment Caller is having sudden confusion and severe headaches and severe pain all over. Translation No Nurse Assessment Nurse: Caryn Bee, RN, Ayrine Date/Time (Eastern Time): 11/19/2022 8:17:12 AM Confirm and document reason for call. If symptomatic, describe symptoms. ---Caller states he has been getting headaches and has been confused for a couple of weeks. Patient takes Allopurinol. Pt has some pain during urination and feels pressure in his head. Does the patient have any new or worsening symptoms? ---Yes Will a triage be completed? ---Yes Related visit to physician within the last 2 weeks? ---No Does the PT have any chronic conditions? (i.e. diabetes, asthma, this includes High risk factors for pregnancy, etc.) ---Yes List chronic conditions. ---Hx of UTI. Hx of Kidney problems. Elevated uric acid Is this a behavioral health or substance abuse call? ---No Guidelines Guideline Title Affirmed Question Affirmed Notes Nurse Date/Time (Eastern Time) Urination Pain - Male [1] Constant pain in scrotum or testicle AND [2] present > 1 hour Jurovschi, RN, Ayrine 11/19/2022 8:25:38 AM  Disp. Time Lamount Cohen Time) Disposition Final User 11/19/2022 8:15:35 AM Send to Urgent Queue  Burney Gauze 11/19/2022 8:31:07 AM Go to ED Now (or PCP triage) Yes Caryn Bee, RN, Ayrine Final Disposition 11/19/2022 8:31:07 AM Go to ED Now (or PCP triage) Yes Jurovschi, RN, Desma Mcgregor Disagree/Comply Disagree Caller Understands Yes PreDisposition Call Doctor Care Advice Given Per Guideline GO TO ED NOW (OR PCP TRIAGE): * IF NO PCP (PRIMARY CARE PROVIDER) SECOND-LEVEL TRIAGE: You need to be seen within the next hour. Go to the ED/UCC at _____________ Hospital. Leave as soon as you can. CARE ADVICE given per Urination Pain - Male (Adult) guideline.  Comments User: Mickle Mallory, RN Date/Time Lamount Cohen Time): 11/19/2022 8:37:54 AM PT states he does not want to go to UC or ED and would rather be seen in the office. RN warm transferred patient to the backline. An appointment was set for 09:20am with Dr. Jimmey Ralph. Referrals REFERRED TO PCP OFFICE Warm transfer to backline

## 2022-11-19 NOTE — Discharge Instructions (Signed)
Please keep your urology appointment.  This could be related to the hydrocele or varicocele that you have in the right testicle contributing to your scrotal pain.  There is no evidence of urinary tract infection today which is great news.  You may return to the emergency department for any worsening symptoms.

## 2022-11-20 DIAGNOSIS — N3941 Urge incontinence: Secondary | ICD-10-CM | POA: Diagnosis not present

## 2022-11-20 DIAGNOSIS — N5082 Scrotal pain: Secondary | ICD-10-CM | POA: Diagnosis not present

## 2022-11-22 NOTE — Progress Notes (Unsigned)
Cardiology Office Note:   Date:  11/25/2022  ID:  Joshua [name], DOB 15-Aug-1940, MRN [id number] PCP: Shelva Majestic, MD  [facility] Providers Cardiologist:  Rollene Rotunda, MD {  History of Present Illness:   Joshua [name] is a 82 y.o. male who presents for follow up of CAD s/p CABG, HTN, HLD, pancreatitis and OSA on CPAP. She had a negative perfusion study in 2015.  He had chest pain in January 2020 and routine treadmill stress test showing no ischemia.    Due to recurrent chest discomfort, patient eventually underwent cardiac catheterization on 04/13/2019 that revealed severe triple-vessel coronary artery disease s/p 5 vessel bypass with 5 out of 5 patent bypass grafts.  The native LAD was occluded beyond the first diagonal branch.  Patent LIMA graft. The circumflex is occluded at the ostium.  The moderate caliber distal OM branch fills from the patent vein graft.  The small caliber more proximal OM fills retrograde from collaterals.  The native RCA is occluded proximally.  The vein graft to the distal RCA is patent. A DES was successfully placed to origin to proximal graft lesion saphenous graft to third marginal.  Patient had normal left ventricular systolic function.  He was discharged on aspirin and Plavix.  Since I last saw him he is limited by back pain and knee pain.  He was able to get on the reclining bike.   The patient denies any new symptoms such as chest discomfort, neck or arm discomfort. There has been no new shortness of breath, PND or orthopnea. There have been no reported palpitations, presyncope or syncope.      ROS:   Scrotal pain (evaluated by ED, urology and soon dermatology.)   Studies Reviewed:    EKG:   EKG Interpretation Date/Time:  Wednesday November 25 2022 09:49:26 EDT Ventricular Rate:  85 PR Interval:  172 QRS Duration:  82 QT Interval:  348 QTC Calculation: 414 R Axis:   -24  Text Interpretation: Normal sinus rhythm Poor anterior R wave  progression When compared with ECG of 25-Nov-2022 09:47, Premature ventricular complexes are no longer Present Confirmed by Rollene Rotunda ([id number]) on 11/25/2022 10:00:15 AM     Risk Assessment/Calculations:              Physical Exam:   VS:  BP 108/68 (BP Location: Right Arm, Patient Position: Sitting, Cuff Size: Normal)   Pulse 85   Ht 5\' 6"  (1.676 m)   Wt 212 lb 9.6 oz (96.4 kg)   SpO2 93%   BMI 34.31 kg/m    Wt Readings from Last 3 Encounters:  11/25/22 212 lb 9.6 oz (96.4 kg)  11/06/22 210 lb (95.3 kg)  11/03/22 211 lb (95.7 kg)     GEN: Well nourished, well developed in no acute distress NECK: No JVD; No carotid bruits CARDIAC: RRR, no murmurs, rubs, gallops RESPIRATORY:  Clear to auscultation without rales, wheezing or rhonchi  ABDOMEN: Soft, non-tender, non-distended EXTREMITIES:  No edema; No deformity   ASSESSMENT AND PLAN:   CAD s/p CABG:  The patient has no new sypmtoms.  No further cardiovascular testing is indicated.  We will continue with aggressive risk reduction and meds as listed.   Hyperlipidemia:     He has been followed by Dr. Rennis Golden.  LDL is 46 with an HDL of 42.  No change in therapy.   Hypertension:   The BP is at target.  No change in therapy.   Obstructive sleep apnea:  He wears a CPAP.  No change in therapy.     Current medicines are reviewed at length with the patient today.  The patient does not have concerns regarding medicines.   The following changes have been made: None   Labs/ tests ordered today include: None        Follow up me in one year.   Signed, Rollene Rotunda, MD

## 2022-11-25 ENCOUNTER — Ambulatory Visit: Payer: PPO | Attending: Cardiology | Admitting: Cardiology

## 2022-11-25 ENCOUNTER — Encounter: Payer: Self-pay | Admitting: Cardiology

## 2022-11-25 VITALS — BP 108/68 | HR 85 | Ht 66.0 in | Wt 212.6 lb

## 2022-11-25 DIAGNOSIS — I25118 Atherosclerotic heart disease of native coronary artery with other forms of angina pectoris: Secondary | ICD-10-CM | POA: Diagnosis not present

## 2022-11-25 DIAGNOSIS — E785 Hyperlipidemia, unspecified: Secondary | ICD-10-CM

## 2022-11-25 DIAGNOSIS — I10 Essential (primary) hypertension: Secondary | ICD-10-CM

## 2022-11-25 NOTE — Patient Instructions (Signed)
Medication Instructions:  Your physician recommends that you continue on your current medications as directed. Please refer to the Current Medication list given to you today.   *If you need a refill on your cardiac medications before your next appointment, please call your pharmacy*     Follow-Up: At [facility], you and your health needs are our priority.  As part of our continuing mission to provide you with exceptional heart care, we have created designated Provider Care Teams.  These Care Teams include your primary Cardiologist (physician) and Advanced Practice Providers (APPs -  Physician Assistants and Nurse Practitioners) who all work together to provide you with the care you need, when you need it.  We recommend signing up for the patient portal called "MyChart".  Sign up information is provided on this After Visit Summary.  MyChart is used to connect with patients for Virtual Visits (Telemedicine).  Patients are able to view lab/test results, encounter notes, upcoming appointments, etc.  Non-urgent messages can be sent to your provider as well.   To learn more about what you can do with MyChart, go to [email or web address].    Your next appointment:   1 year(s)  The format for your next appointment:   In Person  Provider:   Rollene Rotunda, MD

## 2022-11-26 DIAGNOSIS — L309 Dermatitis, unspecified: Secondary | ICD-10-CM | POA: Diagnosis not present

## 2022-12-02 ENCOUNTER — Telehealth: Payer: Self-pay

## 2022-12-02 NOTE — Telephone Encounter (Signed)
Transition Care Management Follow-up Telephone Call Date of discharge and from where: 11/19/2022 [facility] How have you been since you were released from the hospital? Patient stated he is feeling better. Any questions or concerns? No  Items Reviewed: Did the pt receive and understand the discharge instructions provided? Yes  Medications obtained and verified?  No medication prescribed. Other? No  Any new allergies since your discharge? No  Dietary orders reviewed? Yes Do you have support at home? Yes   Follow up appointments reviewed:  PCP Hospital f/u appt confirmed? Yes  Scheduled to see Aldine Contes. Durene Cal, MD on 12/11/2022 @ [facility] Specialist Hospital f/u appt confirmed? No  Scheduled to see  on  @ . Are transportation arrangements needed? No  If their condition worsens, is the pt aware to call PCP or go to the Emergency Dept.? Yes Was the patient provided with contact information for the PCP's office or ED? Yes Was to pt encouraged to call back with questions or concerns? Yes  Nussen Pullin [facility]  [facility], [facility] Guide Direct Dial: [phone]  Website: [email or web address]

## 2022-12-03 DIAGNOSIS — N3946 Mixed incontinence: Secondary | ICD-10-CM | POA: Diagnosis not present

## 2022-12-03 DIAGNOSIS — N5082 Scrotal pain: Secondary | ICD-10-CM | POA: Diagnosis not present

## 2022-12-07 DIAGNOSIS — G4733 Obstructive sleep apnea (adult) (pediatric): Secondary | ICD-10-CM | POA: Diagnosis not present

## 2022-12-08 ENCOUNTER — Other Ambulatory Visit: Payer: Self-pay | Admitting: Internal Medicine

## 2022-12-08 ENCOUNTER — Other Ambulatory Visit (HOSPITAL_BASED_OUTPATIENT_CLINIC_OR_DEPARTMENT_OTHER): Payer: Self-pay

## 2022-12-08 DIAGNOSIS — I2510 Atherosclerotic heart disease of native coronary artery without angina pectoris: Secondary | ICD-10-CM

## 2022-12-08 DIAGNOSIS — E785 Hyperlipidemia, unspecified: Secondary | ICD-10-CM

## 2022-12-08 MED ORDER — COMIRNATY 30 MCG/0.3ML IM SUSY
0.3000 mL | PREFILLED_SYRINGE | Freq: Once | INTRAMUSCULAR | 0 refills | Status: AC
  Filled 2022-12-08: qty 0.3, 1d supply, fill #0

## 2022-12-08 MED ORDER — FLUAD 0.5 ML IM SUSY
0.5000 mL | PREFILLED_SYRINGE | Freq: Once | INTRAMUSCULAR | 0 refills | Status: AC
  Filled 2022-12-08: qty 0.5, 1d supply, fill #0

## 2022-12-08 NOTE — Telephone Encounter (Signed)
Reached out to [employer].   Provided pt's medicare ID number. Company also says RX was never rec'd. Will refax.

## 2022-12-08 NOTE — Progress Notes (Unsigned)
[name] [address] [place] [address] Phone: [phone] Subjective:   [name], am serving as a scribe for Dr. Antoine Primas.  I'm seeing this patient by the request  of:  Shelva Majestic, MD  CC: wrist pain follow up   [id number]  10/28/2022 Given exercises, topical anti-inflammatories and a thumb spica splint to wear day and night for 2 weeks then nightly for 2 weeks.  This should be beneficial.  If continuing to have difficulty consider injections but patient does feel nauseous when he gets steroid injections      Update 12/09/2022 Joshua [name] is a 82 y.o. male coming in with complaint of R wrist pain. Patient states R knee is doing well, L is not as good. R Wrist is not doing well. Back of the hand is hurting.      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"   CAD (coronary artery disease)    CABG 2001   Diverticulosis    DJD (degenerative joint disease)    BACK   GERD (gastroesophageal reflux disease)    H/O inguinal hernia repair 12/2018   History of gout    Hyperlipidemia    Hypertension    IBS (irritable bowel syndrome)    Obesity    OSA (obstructive sleep apnea)    USES CPAP    Pancreatitis ~ 1980   Peripheral vascular disease (HCC)    Pre-diabetes    Prostate cancer (HCC) 04/2010   Past Surgical History:  Procedure Laterality Date   ANTERIOR CERVICAL DECOMP/DISCECTOMY FUSION  05/26/2006   PLATE PLACED   BACK SURGERY     CARDIAC CATHETERIZATION  11/20/1999   CORONARY ARTERY BYPASS GRAFT  11/20/1999   SVG-RI1-RI2, SVG-OM, SVG-dRCA   CORONARY STENT INTERVENTION N/A 04/13/2019   Procedure: CORONARY STENT INTERVENTION;  Surgeon: Kathleene Hazel, MD;  Location: [facility];  Service:  Cardiovascular;  Laterality: N/A;   HERNIA REPAIR     INGUINAL HERNIA REPAIR Left 01/02/2019   Procedure: OPEN LEFT INGUINAL HERNIA REPAIR WITH MESH;  Surgeon: Luretha Murphy, MD;  Location: [facility];  Service: General;  Laterality: Left;   KNEE ARTHROSCOPY Right 1992; 04/2008   LAPAROSCOPIC CHOLECYSTECTOMY  2007   LEFT HEART CATH AND CORS/GRAFTS ANGIOGRAPHY N/A 04/13/2019   Procedure: LEFT HEART CATH AND CORS/GRAFTS ANGIOGRAPHY;  Surgeon: Kathleene Hazel, MD;  Location: [facility];  Service: Cardiovascular;  Laterality: N/A;   NASAL SEPTUM SURGERY Right 2007   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   ROBOT ASSISTED LAPAROSCOPIC RADICAL PROSTATECTOMY  04/2010   THYROIDECTOMY, PARTIAL  1980   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   Procedure: LEFT TOTAL KNEE ARTHROPLASTY;  Surgeon: Marcene Corning, MD;  Location: [facility];  Service: Orthopedics;  Laterality: Left;   Social History   Socioeconomic History   Marital status: Married    Spouse name: Not on file   Number of children: Not on file   Years of education: Not on file   Highest education level: Associate degree: occupational, Scientist, product/process development, or vocational program  Occupational History   Occupation: retired    Associate Professor: RETIRED  Tobacco Use   Smoking status: Former    Current packs/day: 0.00  Average packs/day: 1 pack/day for 35.0 years (35.0 ttl pk-yrs)    Types: Cigarettes    Start date: 06/16/1957    Quit date: 06/16/1992    Years since quitting: 30.5   Smokeless tobacco: Never  Vaping Use   Vaping status: Never Used  Substance and Sexual Activity   Alcohol use: Yes    Alcohol/week: 2.0 standard drinks of alcohol    Types: 2 Shots of liquor per week    Comment: occ   Drug use: No   Sexual activity: Not Currently    Partners: Female  Other Topics Concern   Not on file  Social History Narrative   Married 42 years in 2015. 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      Hobbies: poker, movies and tv, staying active      Right handed   2 level home with steps he uses   Social Determinants of Corporate investment banker Strain: Low Risk  (07/10/2022)   Overall Financial Resource Strain (CARDIA)    Difficulty of Paying Living Expenses: Not hard at all  Food Insecurity: No Food Insecurity (07/10/2022)   Hunger Vital Sign    Worried About Running Out of Food in the Last Year: Never true    Ran Out of Food in the Last Year: Never true  Transportation Needs: No Transportation Needs (07/10/2022)   PRAPARE - Administrator, Civil Service (Medical): No    Lack of Transportation (Non-Medical): No  Physical Activity: Insufficiently Active (07/10/2022)   Exercise Vital Sign    Days of Exercise per Week: 1 day    Minutes of Exercise per Session: 10 min  Stress: No Stress Concern Present (07/10/2022)   [employer] of Occupational Health - Occupational Stress Questionnaire    Feeling of Stress : Only a little  Social Connections: Moderately Isolated (07/10/2022)   Social Connection and Isolation Panel [NHANES]    Frequency of Communication with Friends and Family: More than three times a week    Frequency of Social Gatherings with Friends and Family: Three times a week    Attends Religious Services: Never    Active Member of Clubs or Organizations: No    Attends Banker Meetings: Never    Marital Status: Married   Allergies  Allergen Reactions   Benadryl [Diphenhydramine] Other (See Comments)    Pt told not to take because of bypass surgery   Bromfed Nausea And Vomiting   Clarithromycin Other (See Comments)     gastritis   Codeine Other (See Comments)    Trouble breathing   Cymbalta [Duloxetine Hcl]     Abdominal pain and later chest pain as well as worsening fatigue    Doxycycline Hyclate Nausea And Vomiting   Lexapro [Escitalopram]     Dizziness   Modafinil Other (See Comments)      anxiety-nervousness   Oxycodone-Acetaminophen Itching   Promethazine Hcl Other (See Comments)     fainting   Quinolones Nausea Only    Cipro "felt real bad"   Repatha [Evolocumab]     Weak, worn out   Telithromycin Nausea And Vomiting   Tramadol Nausea Only   Hydrocodone-Acetaminophen Rash   Statins Other (See Comments)    Leg cramps and makes patient feel bad. Per pt tried multiple in years past   Sulfonamide Derivatives Rash   Family History  Problem Relation Age of Onset   Heart disease Mother    Hyperlipidemia Mother    Heart disease Father    Hyperlipidemia Father    Diabetes Brother    Colon cancer Neg Hx    Pancreatic cancer Neg Hx    Esophageal cancer Neg Hx    Stomach cancer Neg Hx    Liver cancer Neg Hx      Current Outpatient Medications (Cardiovascular):    Alirocumab (PRALUENT) 150 MG/ML SOAJ, INJECT 1 DOSE INTO THE SKIN EVERY 14 (FOURTEEN) DAYS.   amLODipine (NORVASC) 5 MG tablet, TAKE 1 TABLET (5 MG TOTAL) BY MOUTH DAILY.   EPINEPHrine 0.3 mg/0.3 mL IJ SOAJ injection, Inject 0.3 mg into the muscle as needed for anaphylaxis. for allergic reaction   ezetimibe (ZETIA) 10 MG tablet, TAKE 1 TABLET BY MOUTH EVERY DAY   hydrochlorothiazide (HYDRODIURIL) 25 MG tablet, TAKE 1 TABLET BY MOUTH EVERY DAY   nitroGLYCERIN (NITROSTAT) 0.4 MG SL tablet, PLACE 1 TABLET UNDER THE TONGUE EVERY 5 (FIVE) MINUTES AS NEEDED.   valsartan (DIOVAN) 320 MG tablet, TAKE 1 TABLET BY MOUTH EVERY DAY  Current Outpatient Medications (Respiratory):    albuterol (PROAIR HFA) 108 (90 Base) MCG/ACT inhaler, Inhale 1-2 puffs into the lungs every 6 (six) hours as needed for wheezing or shortness of breath.   ARNUITY ELLIPTA 200 MCG/ACT AEPB, Inhale 1 puff into the lungs daily.   fluticasone (FLONASE) 50 MCG/ACT nasal spray, Place 1 spray into both nostrils daily.   Fluticasone Furoate (ARNUITY ELLIPTA) 200 MCG/ACT AEPB, Inhale 1 puff into the lungs daily.  Current Outpatient Medications  (Analgesics):    acetaminophen (TYLENOL) 500 MG tablet, Take 1,000 mg by mouth every 6 (six) hours as needed for moderate pain or headache.   allopurinol (ZYLOPRIM) 100 MG tablet, Take 1 tablet (100 mg total) by mouth daily. History of gout, this lowers uric acid and risk of recurrence   aspirin EC 81 MG tablet, Take 1 tablet (81 mg total) by mouth 2 (two) times daily after a meal.   traMADol (ULTRAM) 50 MG tablet, Take 1 tablet (50 mg total) by mouth every 6 (six) hours as needed (no driving 6 hours after taking and do not take within 6 hours of diazepam).  Current Outpatient Medications (Hematological):    Cyanocobalamin (B-12) 2500 MCG TABS, Take 1,250 mcg by mouth daily.   folic acid (FOLVITE) 800 MCG tablet, Take 800 mcg by mouth daily.   Current Outpatient Medications (Other):    cholecalciferol (VITAMIN D3) 25 MCG (1000 UNIT) tablet, Take 1,000 Units by mouth daily.   clobetasol cream (TEMOVATE) 0.05 %, APPLY TO AFFECTED AREA TWICE A DAY See Dr. Durene Cal soon and discuss about further refills.   clotrimazole-betamethasone (LOTRISONE) cream, Apply 1 Application topically 2 (two) times daily.   COVID-19 mRNA vaccine 2024-2025 (COMIRNATY) syringe, Inject 0.3 mLs into the muscle once for 1 dose.   diazepam (VALIUM) 5 MG tablet, TAKE 1 TABLET (5 MG TOTAL) BY MOUTH AT BEDTIME AS NEEDED (SLEEP).   fluconazole (DIFLUCAN) 150 MG tablet, Take 1 tablet (150 mg total) by mouth every 3 (three) days.   gabapentin (NEURONTIN) 300 MG capsule, Take 2 cap in AM, 1 cap at noon, 2caps at bedtime   Glucos-Chond-Hyal Ac-Ca Fructo (MOVE FREE JOINT HEALTH ADVANCE PO), Take 1 tablet by mouth daily.   influenza vaccine adjuvanted (FLUAD) 0.5 ML injection, Inject 0.5 mLs into the muscle once for 1 dose.   Lidocaine HCl (ASPERCREME LIDOCAINE) 4 % CREA,  Apply topically in the morning and at bedtime.   Miconazole Nitrate (LOTRIMIN AF DEODORANT POWDER) 2 % AERP, Apply 1 spray topically daily as needed (jock itch).    mirabegron ER (MYRBETRIQ) 50 MG TB24 tablet, Take 1 tablet (50 mg total) by mouth daily.   nystatin (MYCOSTATIN/NYSTOP) powder, Apply 1 Application topically 3 (three) times daily.   nystatin ointment (MYCOSTATIN), Apply 1 Application topically 2 (two) times daily.   pantoprazole (PROTONIX) 40 MG tablet, TAKE 1 TABLET BY MOUTH EVERY DAY   polyethylene glycol powder (GLYCOLAX/MIRALAX) powder, Take 17 g by mouth daily. (Patient taking differently: Take 17 g by mouth in the morning and at bedtime.)   Probiotic Product (PROBIOTIC PO), Take by mouth in the morning.   Wheat Dextrin (BENEFIBER) POWD, Take 1 Dose by mouth daily. 1 dose = 2 teaspoons   Reviewed prior external information including notes and imaging from  primary care provider As well as notes that were available from care everywhere and other healthcare systems.  Past medical history, social, surgical and family history all reviewed in electronic medical record.  No pertanent information unless stated regarding to the chief complaint.   Review of Systems:  No headache, visual changes, nausea, vomiting, diarrhea, constipation, dizziness, abdominal pain, skin rash, fevers, chills, night sweats, weight loss, swollen lymph nodes, body aches, joint swelling, chest pain, shortness of breath, mood changes. POSITIVE muscle aches  Objective  There were no vitals taken for this visit.   General: No apparent distress alert and oriented x3 mood and affect normal, dressed appropriately.  HEENT: Pupils equal, extraocular movements intact  Antalgic gait noted.  Does have some swelling around the abductor pollicis longus tendon.  No red streaking.  No warmth to it.  Positive Finkelstein's.   Procedure: Real-time Ultrasound Guided Injection of right Abductor pollicis longs tendon sheath Device: GE Logiq E  Ultrasound guided injection is preferred based studies that show increased duration, increased effect, greater accuracy, decreased procedural  pain, increased response rate with ultrasound guided versus blind injection.  Verbal informed consent obtained.  Time-out conducted.  Noted no overlying erythema, induration, or other signs of local infection.  Skin prepped in a sterile fashion.  Local anesthesia: Topical Ethyl chloride.  With sterile technique and under real time ultrasound guidance:  tendon visualized.  23g 5/8 inch needle inserted distal to proximal approach into tendon sheath. Pictures taken  for needle placement. Patient did have injection of 0.5 cc of 0.5% Marcaine, and 0.5 cc of Toradol 30 mg/mL Completed without difficulty  Pain immediately improved suggesting accurate placement of the medication.  Advised to call if fevers/chills, erythema, induration, drainage, or persistent bleeding.  Impression: Technically successful ultrasound guided injection.    Impression and Recommendations:    The above documentation has been reviewed and is accurate and complete Judi Saa, DO

## 2022-12-09 ENCOUNTER — Other Ambulatory Visit: Payer: Self-pay

## 2022-12-09 ENCOUNTER — Ambulatory Visit: Payer: PPO | Admitting: Family Medicine

## 2022-12-09 ENCOUNTER — Encounter: Payer: Self-pay | Admitting: Family Medicine

## 2022-12-09 VITALS — BP 118/60 | HR 57 | Ht 66.0 in | Wt 209.0 lb

## 2022-12-09 DIAGNOSIS — M654 Radial styloid tenosynovitis [de Quervain]: Secondary | ICD-10-CM

## 2022-12-09 MED ORDER — KETOROLAC TROMETHAMINE 30 MG/ML IJ SOLN
15.0000 mg | Freq: Once | INTRAMUSCULAR | Status: AC
Start: 2022-12-09 — End: 2022-12-09
  Administered 2022-12-09: 15 mg via INTRAMUSCULAR

## 2022-12-09 NOTE — Patient Instructions (Addendum)
Injection in wrist today Have a good birthday trip Wear the brace at night If redness worsens call us or seek medical attention See you again in 3 months

## 2022-12-09 NOTE — Telephone Encounter (Signed)
Refaxed RX to [facility]

## 2022-12-09 NOTE — Assessment & Plan Note (Signed)
Toradol injection given today.  No sign of any type of infectious etiology.  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  Discussed over-the-counter medications and will need to consider the possibility of other medications or increasing the allopurinol if this is gout.  We discussed bracing at night and follow-up again in 6 to 8 weeks otherwise.

## 2022-12-11 ENCOUNTER — Ambulatory Visit (INDEPENDENT_AMBULATORY_CARE_PROVIDER_SITE_OTHER): Payer: PPO | Admitting: Family Medicine

## 2022-12-11 ENCOUNTER — Encounter: Payer: Self-pay | Admitting: Family Medicine

## 2022-12-11 VITALS — BP 132/80 | HR 78 | Temp 98.0°F | Ht 66.0 in | Wt 210.6 lb

## 2022-12-11 DIAGNOSIS — I10 Essential (primary) hypertension: Secondary | ICD-10-CM

## 2022-12-11 DIAGNOSIS — E785 Hyperlipidemia, unspecified: Secondary | ICD-10-CM | POA: Diagnosis not present

## 2022-12-11 MED ORDER — NITROGLYCERIN 0.4 MG SL SUBL: 0.4000 mg | SUBLINGUAL_TABLET | SUBLINGUAL | 5 refills | Status: DC | PRN

## 2022-12-11 NOTE — Patient Instructions (Addendum)
Glad you are doing reasonably well -send me picture of  the cream from dermatology -flovent so I can update these  Recommended follow up: Return in about 2 months (around 02/10/2023) for followup or sooner if needed.Schedule b4 you leave.

## 2022-12-11 NOTE — Progress Notes (Signed)
Phone [phone] In person visit   Subjective:   Joshua [name] is a 82 y.o. year old very pleasant male patient who presents for/with See problem oriented charting Chief Complaint  Patient presents with   Knee Pain    Pt c/o of left knee pain    Arthritis    Pt c/o of arthritis in right hand    Testicle Pain    Pt requesting pain medication for Testicle issue    Past Medical History-  Patient Active Problem List   Diagnosis Date Noted   Coronary atherosclerosis 10/12/2006    Priority: High   Overactive bladder 11/24/2019    Priority: Medium    Hyperglycemia 10/07/2017    Priority: Medium    Gout 11/20/2016    Priority: Medium    CKD (chronic kidney disease), stage III (HCC) 10/23/2013    Priority: Medium    Neuropathy (HCC) 08/21/2013    Priority: Medium    History of bladder cancer 04/01/2010    Priority: Medium    Essential hypertension 02/21/2007    Priority: Medium    Hyperlipidemia 10/12/2006    Priority: Medium    Asthma 10/12/2006    Priority: Medium    Aortic atherosclerosis (HCC) 05/05/2018    Priority: Low   Senile purpura (HCC) 10/07/2017    Priority: Low   DJD (degenerative joint disease)     Priority: Low   Arthritis     Priority: Low   Neck pain 02/08/2017    Priority: Low   Venous insufficiency 12/29/2016    Priority: Low   Ganglion cyst of tendon sheath of right hand 12/29/2016    Priority: Low   Obesity, Class I, BMI 30-34.9 10/03/2015    Priority: Low   Allergic rhinitis 05/31/2014    Priority: Low   Fatigue 11/09/2013    Priority: Low   Dizziness 08/23/2013    Priority: Low   Chronic bilateral low back pain with left-sided sciatica 10/09/2011    Priority: Low   OSTEOARTHRITIS, LOWER LEG, LEFT 10/04/2009    Priority: Low   TINNITUS, CHRONIC, 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    Priority: Low   OSA (obstructive sleep apnea) 11/02/2006    Priority: Low   GERD 10/12/2006    Priority: Low   De Quervain's tenosynovitis, right 10/28/2022    Inhaled foreign object 09/02/2022   Calcium oxalate crystals in urine 07/30/2022   Balanitis 07/06/2022   Urinary frequency 07/06/2022   Degenerative arthritis of right knee 06/08/2022   Left hamstring injury 04/21/2022   Degenerative cervical disc 01/12/2022   Greater trochanteric bursitis of right hip 02/17/2021   Hyperactive bowel sounds 01/29/2021   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) 09/25/2020   Primary osteoarthritis of left knee 06/18/2020   Pre-operative respiratory examination 06/05/2020   Coronary artery disease involving coronary bypass graft of native heart without angina pectoris 04/07/2020   Trigger thumb, left thumb 01/24/2020   Left lateral epicondylitis 09/05/2019   Educated about COVID-19 virus infection 04/20/2019   Greater trochanteric bursitis of left hip 10/27/2018   Degenerative disc disease, lumbar 10/27/2018   Abdominal muscle strain, initial encounter 06/16/2018   Weakness of left leg 06/21/2017   Hereditary and idiopathic peripheral neuropathy 09/25/2014   Generalized abdominal pain 01/31/2009    Medications- reviewed and updated Current Outpatient Medications  Medication Sig Dispense Refill   acetaminophen (TYLENOL) 500 MG tablet Take 1,000 mg by mouth every 6 (six) hours as needed for moderate pain or  headache.     albuterol (PROAIR HFA) 108 (90 Base) MCG/ACT inhaler Inhale 1-2 puffs into the lungs every 6 (six) hours as needed for wheezing or shortness of breath. 18 g 5   Alirocumab (PRALUENT) 150 MG/ML SOAJ INJECT 1 DOSE INTO THE SKIN EVERY 14 (FOURTEEN) DAYS. 6 mL 3   allopurinol (ZYLOPRIM) 100 MG tablet Take 1 tablet (100 mg total) by mouth daily. History of gout, this lowers uric acid and risk of recurrence 90 tablet 3   amLODipine (NORVASC) 5 MG tablet TAKE 1 TABLET (5 MG TOTAL) BY MOUTH DAILY. 90 tablet 3   aspirin EC 81 MG tablet Take 1 tablet (81 mg total) by mouth 2 (two) times daily after a meal. 30 tablet 11    cholecalciferol (VITAMIN D3) 25 MCG (1000 UNIT) tablet Take 1,000 Units by mouth daily.     clobetasol cream (TEMOVATE) 0.05 % APPLY TO AFFECTED AREA TWICE A DAY See Dr. Durene Cal soon and discuss about further refills. 30 g 0   clotrimazole-betamethasone (LOTRISONE) cream Apply 1 Application topically 2 (two) times daily.     Cyanocobalamin (B-12) 2500 MCG TABS Take 1,250 mcg by mouth daily.     diazepam (VALIUM) 5 MG tablet TAKE 1 TABLET (5 MG TOTAL) BY MOUTH AT BEDTIME AS NEEDED (SLEEP). 30 tablet 0   EPINEPHrine 0.3 mg/0.3 mL IJ SOAJ injection Inject 0.3 mg into the muscle as needed for anaphylaxis. for allergic reaction     ezetimibe (ZETIA) 10 MG tablet TAKE 1 TABLET BY MOUTH EVERY DAY 90 tablet 2   fluconazole (DIFLUCAN) 150 MG tablet Take 1 tablet (150 mg total) by mouth every 3 (three) days. 10 tablet 2   fluticasone (FLONASE) 50 MCG/ACT nasal spray Place 1 spray into both nostrils daily.     folic acid (FOLVITE) 800 MCG tablet Take 800 mcg by mouth daily.      gabapentin (NEURONTIN) 300 MG capsule Take 2 cap in AM, 1 cap at noon, 2caps at bedtime 450 capsule 3   Glucos-Chond-Hyal Ac-Ca Fructo (MOVE FREE JOINT HEALTH ADVANCE PO) Take 1 tablet by mouth daily.     hydrochlorothiazide (HYDRODIURIL) 25 MG tablet TAKE 1 TABLET BY MOUTH EVERY DAY 90 tablet 2   Lidocaine HCl (ASPERCREME LIDOCAINE) 4 % CREA Apply topically in the morning and at bedtime.     Miconazole Nitrate (LOTRIMIN AF DEODORANT POWDER) 2 % AERP Apply 1 spray topically daily as needed (jock itch).     mirabegron ER (MYRBETRIQ) 50 MG TB24 tablet Take 1 tablet (50 mg total) by mouth daily. 90 tablet 1   nitroGLYCERIN (NITROSTAT) 0.4 MG SL tablet PLACE 1 TABLET UNDER THE TONGUE EVERY 5 (FIVE) MINUTES AS NEEDED. 75 tablet 1   nystatin (MYCOSTATIN/NYSTOP) powder Apply 1 Application topically 3 (three) times daily. 60 g 11   nystatin ointment (MYCOSTATIN) Apply 1 Application topically 2 (two) times daily. 30 g 3   pantoprazole  (PROTONIX) 40 MG tablet TAKE 1 TABLET BY MOUTH EVERY DAY 90 tablet 3   polyethylene glycol powder (GLYCOLAX/MIRALAX) powder Take 17 g by mouth daily. (Patient taking differently: Take 17 g by mouth in the morning and at bedtime.) 255 g 0   Probiotic Product (PROBIOTIC PO) Take by mouth in the morning.     traMADol (ULTRAM) 50 MG tablet Take 1 tablet (50 mg total) by mouth every 6 (six) hours as needed (no driving 6 hours after taking and do not take within 6 hours of diazepam). 20 tablet 1  valsartan (DIOVAN) 320 MG tablet TAKE 1 TABLET BY MOUTH EVERY DAY 90 tablet 3   Wheat Dextrin (BENEFIBER) POWD Take 1 Dose by mouth daily. 1 dose = 2 teaspoons     No current facility-administered medications for this visit.     Objective:  BP 132/80   Pulse 78   Temp 98 F (36.7 C)   Ht 5\' 6"  (1.676 m)   Wt 210 lb 9.6 oz (95.5 kg)   SpO2 97%   BMI 33.99 kg/m  Gen: NAD, resting comfortably CV: RRR no murmurs rubs or gallops Lungs: CTAB no crackles, wheeze, rhonchi Abdomen: soft/nontender/nondistended/normal bowel sounds. No rebound or guarding.  Ext: trace to 1+ edema Skin: warm, dry     Assessment and Plan   # Orthopedic concerns S: Patient reports left knee pain-  has upcoming procedure to deaden nerves -also has de quervain's tenosynovitis- wearing brace today- recent injection yesterday A/P: getting good care with Dr. Katrinka Blazing- will continue with him.     #Gout S: Medication: Allopurinol 100 milligrams after uric acid noted at 8.5-started by Dr. Jon Billings As needed: none A/P:he is hoping wrist and other issues will improve with allopurinol  -uric acid next visit  #scrotal pain- saw Dr. Yetta Barre on 5th- was given a tube that is slightlyhelping and needing refill- hard to walk with pain at times.   #CAD/hyperlipidemia-history of CABG x5- follows with Dr. Antoine Poche S: Meds: asa 81mg , praluent -Fatigue on Repatha in 2021-restarted on ezetimibe 10 mg p.o. as well but later placed back on  praluent 150 mg q2 weeks -Stent drug eluting placed 04/13/19 Dr. Clifton James -No chest pain or shortness of breath reported  Lab Results  Component Value Date   CHOL 115 06/29/2022   HDL 42 06/29/2022   LDLCALC 46 06/29/2022   LDLDIRECT 115.0 06/04/2016   TRIG 159 (H) 06/29/2022   CHOLHDL 2.7 06/29/2022  A/P: coronary artery disease asymptomatic continue current medications Lipids at goal last visit- continue current medications     #Hypertension/CKD stage III S: Compliant with amlodipine 5 mg, hydrochlorothiazide 25 mg and valsartan 320mg  -GFR has been stable.  On ARB in case proteinuric element BP Readings from Last 3 Encounters:  12/11/22 132/80  12/09/22 118/60  11/25/22 108/68  A/P: hypertension stable- continue current medicines  Chronic kidney disease III- creatinine actually better last month unclear reason-  consider recheck next visit   #Depression, major, single episode S:  Medication: Buspirone 5 mg twice daily as needed-uses very sparingly  Patient has not tolerated medication in the past-feels too tired and sleepy on SSRIs.  Later had dizziness on Lexapro.  -Has declined counseling and continues to decline counseling-has done this in the past and only somewhat helpful but had to find the right fit    12/11/2022    4:09 PM 11/03/2022   10:10 AM 09/18/2022    7:59 AM  Depression screen PHQ 2/9  Decreased Interest 0 2 0  Down, Depressed, Hopeless 1 1 0  PHQ - 2 Score 1 3 0  Altered sleeping 1 0 0  Tired, decreased energy 1 2 0  Change in appetite 2 2 0  Feeling bad or failure about yourself  0 0 0  Trouble concentrating 0 3 0  Moving slowly or fidgety/restless 0 1 0  Suicidal thoughts 0 1 0  PHQ-9 Score 5 12 0  Difficult doing work/chores  Somewhat difficult Not difficult at all  A/P: depression full remission off of medicine - continue to monitor   #  OAB- mybetriq reasonably helpful. Follow up with urology.   % Neuropathy-follows with Dr. Karel Jarvis S: Compliant  with gabapentin 300 mg-2, 1, 2 (prior side effects on Lyrica) .  Burning/needle pain in the feet- worse in winter  A/P: doing ok but worse in winter   #Hyperglycemia history of mildly elevated A1c.  Prediabetes range.  Too soon for repeat A1c Lab Results  Component Value Date   HGBA1C 5.9 08/19/2022   HGBA1C 6.1 03/25/2022   HGBA1C 5.7 05/13/2021   # Asthma--on flovent- more cost effective than arnuity - Also with allergies on Flonase down to once a day   Recommended follow up: No follow-ups on file. Future Appointments  Date Time Provider Department Center  12/17/2022  9:45 AM Kirsteins, Victorino Sparrow, MD [facility]  03/10/2023 10:30 AM Judi Saa, DO [facility] None  04/28/2023  8:30 AM Van Clines, MD [facility] None  06/10/2023 10:00 AM [facility] ANNUAL WELLNESS VISIT 1 [facility]    Lab/Order associations:   ICD-10-CM   1. Essential hypertension  I10     2. Hyperlipidemia, unspecified hyperlipidemia type  E78.5       No orders of the defined types were placed in this encounter.   Return precautions advised.  Tana Conch, MD

## 2022-12-15 ENCOUNTER — Encounter: Payer: Self-pay | Admitting: Family Medicine

## 2022-12-15 ENCOUNTER — Other Ambulatory Visit (HOSPITAL_BASED_OUTPATIENT_CLINIC_OR_DEPARTMENT_OTHER): Payer: Self-pay

## 2022-12-15 DIAGNOSIS — L308 Other specified dermatitis: Secondary | ICD-10-CM | POA: Diagnosis not present

## 2022-12-15 NOTE — Telephone Encounter (Signed)
Received notification from [employer] regarding approval for ARNUITY INHALER. Patient assistance approved from 12/15/22 to 03/23/23.  Medication will ship to patients home.  Pt ID: [id number]  Company phone: [phone]

## 2022-12-16 ENCOUNTER — Encounter: Payer: Self-pay | Admitting: Pharmacist

## 2022-12-16 NOTE — Progress Notes (Signed)
12/16/2022 Name: Joshua [name] MRN: [id number] DOB: February 03, 1941  Chief Complaint  Patient presents with   Medication Management    Joshua [name] is a 82 y.o. year old male who presented for a telephone visit.   They were referred to the pharmacist by their PCP for assistance in managing medication access and complex medication management.    Subjective:  Patient reports affordability concerns with their medications: Yes  - states that Gemtesa was $100  - he has since been changed to generic Myrbetriq 50mg  daily.  Arnuity is $75 to $100 / month. Patient reports access/transportation concerns to their pharmacy: No  Patient reports adherence concerns with their medications:  No     We applied for medication assistance program for Arnuity with [employer] and paitent was approved 09/24/204 - he should receive first delivery to his home in the net 7 to 10 days.   Asthma:  Current medications: Arnuity inhaler - 1 puff into lungs once a day; albuterol HFA inhaler - use 1 or 2 puff as needed for shortness of breath up to every 6 hours.  Medications tried in the past: Flovent - stopped due to cost / discontinued. Ipratropium - stopped 06/2022 by Eileen Stanford.   Reports no exacerbations in the past year  Current medication access support: [employer] for Arnuity - approved 12/15/2022 thru 03/23/2023   GOUT:  Current therapy - allopurinol 100mg  daily Patient does not think that he has ever had an episode of gout but his uric acid was found to be elevated and allopurinol was started. He has been having issues with skin rash that he has been seeing dermatologist for but they have not been successful in treating rash. He wonders if rash is related to allopurinol because he feels that is started around the time he started allopurinol.   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  Also recently has had balanitis  which has improved with antifungal treatment. Urologist also seemed to feel that moisture with incontinence pads was leading to increase in infections.  He had appointment with urologist 11/2022 - they are planning cystoscopy and possible surgical intervention for urinary incontinence   Gemtesa changed to generic Myrbetriq due to cost and [name] was not as effective as Myrbetriq   Hyperlipidemia/ASCVD Risk Reduction  Current lipid lowering medications:  Praulent 150mg  SQ every 14 days and ezetimibe 10mg  daily Medications tried in the past:  Rosuvastatin 10 and 20mg  mg daily - severe muscle pain, problems standing up and walking Atorvastatin 40mg  daily - severe muscle pain  Simvastatin 20 and 40mg  - myalgias; stopped once medication was discontinued.   Antiplatelet regimen: aspirin 81mg  daily   ASCVD History: yes  Current medication access support: Receives assistance from [employer] Hypercholesterolemia grant for cost of Praluent   Objective:  Lab Results  Component Value Date   HGBA1C 5.9 08/19/2022    Lab Results  Component Value Date   CREATININE 1.12 11/19/2022   BUN 21 11/19/2022   NA 131 (L) 11/19/2022   K 4.2 11/19/2022   CL 99 11/19/2022   CO2 25 11/19/2022    Lab Results  Component Value Date   CHOL 115 06/29/2022   HDL 42 06/29/2022   LDLCALC 46 06/29/2022   LDLDIRECT 115.0 06/04/2016   TRIG 159 (H) 06/29/2022   CHOLHDL 2.7 06/29/2022    Medications Reviewed Today     Reviewed by Henrene Pastor, RPH-CPP (Pharmacist) on 12/16/22  at [phone]  Med List Status: <None>   Medication Order Taking? Sig Documenting Provider Last Dose Status Informant  acetaminophen (TYLENOL) 500 MG tablet [id number] Yes Take 1,000 mg by mouth every 6 (six) hours as needed for moderate pain or headache. [provider] Taking Active Spouse/Significant Other  albuterol (PROAIR HFA) 108 (90 Base) MCG/ACT inhaler [id number] Yes Inhale 1-2 puffs into the lungs every 6 (six) hours  as needed for wheezing or shortness of breath. Dulce Sellar, NP Taking Active   Alirocumab (PRALUENT) 150 MG/ML Ivory Broad [id number] Yes INJECT 1 DOSE INTO THE SKIN EVERY 14 (FOURTEEN) DAYS. Chrystie Nose, MD Taking Active   allopurinol (ZYLOPRIM) 100 MG tablet [id number] Yes Take 1 tablet (100 mg total) by mouth daily. History of gout, this lowers uric acid and risk of recurrence [name], MD Taking Active   amLODipine (NORVASC) 5 MG tablet [id number] Yes TAKE 1 TABLET (5 MG TOTAL) BY MOUTH DAILY. Chrystie Nose, MD Taking Active   aspirin EC 81 MG tablet [id number] Yes Take 1 tablet (81 mg total) by mouth 2 (two) times daily after a meal. Elodia Florence, PA-C Taking Active Spouse/Significant Other           Med Note [name], Barth Kirks D   Fri Nov 01, 2020  9:30 AM) Patient is taking only 1 tab/day  cholecalciferol (VITAMIN D3) 25 MCG (1000 UNIT) tablet [id number] Yes Take 1,000 Units by mouth daily. [provider] Taking Active Spouse/Significant Other  clobetasol cream (TEMOVATE) 0.05 % [id number] Yes APPLY TO AFFECTED AREA TWICE A DAY See Dr. Durene Cal soon and discuss about further refills. [name], MD Taking Active   clotrimazole-betamethasone Thurmond Butts) cream [id number] Yes Apply 1 Application topically 2 (two) times daily. [provider] Taking Active   Cyanocobalamin (B-12) 2500 MCG TABS [id number] Yes Take 1,250 mcg by mouth daily. [provider] Taking Active Spouse/Significant Other  diazepam (VALIUM) 5 MG tablet [id number] Yes TAKE 1 TABLET (5 MG TOTAL) BY MOUTH AT BEDTIME AS NEEDED (SLEEP). Shelva Majestic, MD Taking Active   EPINEPHrine 0.3 mg/0.3 mL IJ SOAJ injection [id number] Yes Inject 0.3 mg into the muscle as needed for anaphylaxis. for allergic reaction [provider] Taking Active            Med Note (DAVIS, SOPHIA A   Wed Jan 29, 2021  9:10 AM) On hand  ezetimibe (ZETIA) 10 MG tablet [id number] Yes TAKE 1 TABLET BY MOUTH EVERY DAY  Rollene Rotunda, MD Taking Active   fluticasone (FLONASE) 50 MCG/ACT nasal spray [id number] Yes Place 1 spray into both nostrils daily. Stacie Glaze, MD Taking Active Spouse/Significant Other  folic acid (FOLVITE) 800 MCG tablet [id number] Yes Take 800 mcg by mouth daily.  [provider] Taking Active Spouse/Significant Other           Med Note [name], [place] Breeding   Fri Nov 01, 2020  9:34 AM) Pt is taking 800 mcg unable to find 400 mcg.  gabapentin (NEURONTIN) 300 MG capsule [id number] Yes Take 2 cap in AM, 1 cap at noon, 2caps at bedtime Van Clines, MD Taking Active   Glucos-Chond-Hyal Ac-Ca Fructo (MOVE FREE JOINT HEALTH ADVANCE PO) [id number] Yes Take 1 tablet by mouth daily. [provider] Taking Active Spouse/Significant Other  hydrochlorothiazide (HYDRODIURIL) 25 MG tablet [id number] Yes TAKE 1 TABLET BY MOUTH EVERY DAY Shelva Majestic, MD Taking Active   Lidocaine HCl (ASPERCREME LIDOCAINE) 4 % CREA [id number] Yes Apply topically in the  morning and at bedtime. [provider] Taking Active   Miconazole Nitrate (LOTRIMIN AF DEODORANT POWDER) 2 % AERP [id number] Yes Apply 1 spray topically daily as needed (jock itch). [provider] Taking Active Spouse/Significant Other           Med Note Clydie Braun, [place] B   Tue Sep 08, 2022  2:06 PM) Only using about once a week  mirabegron ER (MYRBETRIQ) 50 MG TB24 tablet [id number] Yes Take 1 tablet (50 mg total) by mouth daily. Shelva Majestic, MD Taking Active   nitroGLYCERIN (NITROSTAT) 0.4 MG SL tablet [id number] Yes Place 1 tablet (0.4 mg total) under the tongue every 5 (five) minutes as needed for chest pain. Shelva Majestic, MD Taking Active   nystatin (MYCOSTATIN/NYSTOP) powder [id number] Yes Apply 1 Application topically 3 (three) times daily. [name], MD Taking Active   nystatin ointment Ambrose Pancoast) [id number] Yes Apply 1 Application topically 2 (two) times daily. [name], MD Taking Active    pantoprazole (PROTONIX) 40 MG tablet [id number] Yes TAKE 1 TABLET BY MOUTH EVERY DAY Hilty, Lisette Abu, MD Taking Active   polyethylene glycol powder (GLYCOLAX/MIRALAX) powder [id number] Yes Take 17 g by mouth daily.  Patient taking differently: Take 17 g by mouth in the morning and at bedtime.   Rolland Porter, MD Taking Active   Probiotic Product (PROBIOTIC PO) [id number] Yes Take by mouth in the morning. [provider] Taking Active   traMADol (ULTRAM) 50 MG tablet [id number] Yes Take 1 tablet (50 mg total) by mouth every 6 (six) hours as needed (no driving 6 hours after taking and do not take within 6 hours of diazepam). Shelva Majestic, MD Taking Active            Med Note Kelli Churn, Sherlynn Carbon   Tue Nov 03, 2022 10:12 AM) Has not had since knee surgery over a year ago  valsartan (DIOVAN) 320 MG tablet [id number] Yes TAKE 1 TABLET BY MOUTH EVERY DAY Shelva Majestic, MD Taking Active   Wheat Dextrin [facility]) POWD [id number] Yes Take 1 Dose by mouth daily. 1 dose = 2 teaspoons [provider] Taking Active Spouse/Significant Other              Assessment/Plan:   Hyperlipidemia/ASCVD Risk Reduction: - Currently controlled. LDL goal is < 70 per Dr Rennis Golden - Recommend to continue Praulent and ezetimibe  Asthma: - Currently controlled.  - Recommend to continue Arnuity daily and albuterol HFA as needed.  - Discussed maintenance versus resuce therapy - Meets financial criteria for Arnuity (though patient might be close to cut off)  patient assistance program through [employer]. Approved 12/15/2022 thru 03/23/2023 - patient aware.   OAB:  - continue follow up with urology - continue Myrbetriq  Gout:  Hold allopurinol to see if rash improved.  Discussed food that can affect uric acid levels.  If uric acid remains elevated or he has gout episode could consider holding hydrochlorothiazide or trial of febuxostat (Uloric)  Follow Up Plan: 3 months  Henrene Pastor,  PharmD Clinical Pharmacist [facility]  [phone]

## 2022-12-16 NOTE — Telephone Encounter (Signed)
Patient notified he was approved to get Arnuity. Expect deliver of medication to his home in about 7 to 10 business days.

## 2022-12-16 NOTE — Telephone Encounter (Signed)
FYI

## 2022-12-16 NOTE — Patient Instructions (Signed)
Gout diet: What's allowed, what's not Starting a gout diet? Understand which foods are OK and which to avoid. By [facility] Staff Gout is a painful form of arthritis that occurs when high levels of uric acid in the blood cause crystals to form and accumulate in and around a joint. Uric acid is produced when the body breaks down a chemical called purine. Purine occurs naturally in your body, but it's also found in certain foods. Uric acid is eliminated from the body in urine. A gout diet may help decrease uric acid levels in the blood. A gout diet isn't a cure. But it may lower the risk of recurring gout attacks and slow the progression of joint damage. People with gout who follow a gout diet generally still need medication to manage pain and to lower levels of uric acid. Gout diet goals A gout diet is designed to help you: Achieve a healthy weight and good eating habits Avoid some, but not all, foods with purines Include some foods that can control uric acid levels A good rule of thumb is to eat moderate portions of healthy foods. Diet details The general principles of a gout diet follow typical healthy-diet recommendations: Weight loss. Being overweight increases the risk of developing gout, and losing weight lowers the risk of gout. Research suggests that reducing the number of calories and losing weight -- even without a purine-restricted diet -- lower uric acid levels and reduce the number of gout attacks. Losing weight also lessens the overall stress on joints. Complex carbs. Eat more fruits, vegetables and whole grains, which provide complex carbohydrates. Avoid foods and beverages with high-fructose corn syrup, and limit consumption of naturally sweet fruit juices. Water. Stay well-hydrated by drinking water. Fats. Cut back on saturated fats from red meat, fatty poultry and high-fat dairy products. Proteins. Focus on lean meat and poultry, low-fat dairy and lentils as sources of  protein.   Foods that might increase uric acid: Organ and glandular meats. Avoid meats such as liver, kidney and sweetbreads, which have high purine levels and contribute to high blood levels of uric acid. Red meat. Limit serving sizes of beef, lamb and pork. Seafood. Some types of seafood -- such as anchovies, shellfish (shrimp, oysters crab), sardines and tuna -- are higher in purines than are other types. But the overall health benefits of eating fish may outweigh the risks for people with gout. Moderate portions of fish can be part of a gout diet. High-purine vegetables. Studies have shown that vegetables high in purines, such as asparagus and spinach, don't increase the risk of gout or recurring gout attacks. Alcohol. Beer and distilled liquors are associated with an increased risk of gout and recurring attacks. Moderate consumption of wine doesn't appear to increase the risk of gout attacks. Avoid alcohol during gout attacks, and limit alcohol, especially beer, between attacks. Sugary foods and beverages. Limit or avoid sugar-sweetened foods such as sweetened cereals, bakery goods and candies. Limit consumption of naturally sweet fruit juices. Foods that might lower gout risk: Vitamin C. Vitamin C may help lower uric acid levels. Talk to your doctor about whether a 500-milligram vitamin C supplement fits into your diet and medication plan. Coffee. Some research suggests that drinking coffee in moderation, especially regular caffeinated coffee, may be associated with a reduced risk of gout. Drinking coffee may not be appropriate if you have other medical conditions. Talk to your doctor about how much coffee is right for you. Cherries. There is some evidence that  eating cherries is associated with a reduced risk of gout attacks.  Results Following a gout diet can help limit uric acid production and increase its elimination. A gout diet isn't likely to lower the uric acid concentration in your  blood enough to treat your gout without medication. But it may help decrease the number of attacks and limit their severity. Following a gout diet, along with limiting calories and getting regular exercise, can also improve your overall health by helping you achieve and maintain a healthy weight.

## 2022-12-17 ENCOUNTER — Encounter: Payer: PPO | Attending: Physical Medicine & Rehabilitation | Admitting: Physical Medicine & Rehabilitation

## 2022-12-17 ENCOUNTER — Encounter: Payer: Self-pay | Admitting: Physical Medicine & Rehabilitation

## 2022-12-17 VITALS — BP 113/53 | HR 43 | Ht 66.0 in | Wt 209.8 lb

## 2022-12-17 DIAGNOSIS — G8928 Other chronic postprocedural pain: Secondary | ICD-10-CM | POA: Diagnosis not present

## 2022-12-17 MED ORDER — LIDOCAINE HCL 1 % IJ SOLN
10.0000 mL | Freq: Once | INTRAMUSCULAR | Status: AC
Start: 2022-12-17 — End: 2022-12-17
  Administered 2022-12-17: 10 mL

## 2022-12-17 MED ORDER — IOHEXOL 180 MG/ML  SOLN
3.0000 mL | Freq: Once | INTRAMUSCULAR | Status: AC
  Administered 2022-12-17: 3 mL

## 2022-12-17 MED ORDER — BUPIVACAINE HCL (PF) 0.5 % IJ SOLN
5.0000 mL | Freq: Once | INTRAMUSCULAR | Status: AC
Start: 2022-12-17 — End: 2022-12-17
  Administered 2022-12-17: 5 mL

## 2022-12-17 NOTE — Progress Notes (Signed)
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 Patient placed supine on the fluoroscopy table AP images of the knee joint were obtained. A 25-gauge 1.5 inch needle was used to anesthetize the skin and subcutaneous tissue with 1% lidocaine, 1.5 cc at each of 3 locations. Then a 22-gauge 3.5" spinal needle was inserted targeting the junction of the medial flare of the tibia with the shaft of the tibia, bone contact made and confirmed with lateral imaging. Then Omnipaque 180 x0.5 mL demonstrated no intravascular uptake followed by injection of 1.41ml .5% bupivacaine. Then the junction of the medial epicondyles of the femur with the femoral shaft was targeted needle was advanced under fluoroscopic guidance until bone contact. Appropriate depth was obtained and confirmed with lateral images. Then Omnipaque 180 x0.5 mL demonstrated no intravascular uptake followed by injection of 1.49ml of .5% bupivacaine. Then the junction of the lateral femoral condyle with the femoral shaft was targeted. 22-gauge 3.5 inch needle was advanced under fluoroscopic guidance until bone contact. Appropriate depth was confirmed with lateral imaging. 0.5 mL of Omnipaque 180 injected followed by injection of 1.5 cc of .5% bupivacaine solution. Patient tolerated procedure well. Post procedure instructions given  Lidocaine 1% with preservative 4.45ml Omnipaque 180 1.84ml Bupivacaine 0.5% 4.58ml

## 2022-12-17 NOTE — Progress Notes (Signed)
PROCEDURE RECORD [facility]   Name: Joshua [name] DOB:12-Jan-1941 MRN: [id number]  Date:12/17/2022  Physician: Claudette Laws, MD    Nurse/CMA: Simeon Vera RN  Allergies:  Allergies  Allergen Reactions   Codeine Other (See Comments)    Trouble breathing   Lexapro [Escitalopram]     Dizziness   Bromfed Nausea And Vomiting   Clarithromycin Other (See Comments)     gastritis   Cymbalta [Duloxetine Hcl]     Abdominal pain and later chest pain as well as worsening fatigue    Doxycycline Hyclate Nausea And Vomiting   Modafinil Other (See Comments)     anxiety-nervousness   Oxycodone-Acetaminophen Itching   Promethazine Hcl Other (See Comments)     fainting   Quinolones Nausea Only    Cipro "felt real bad"   Telithromycin Nausea And Vomiting   Tramadol Nausea Only   Benadryl [Diphenhydramine] Other (See Comments)    Pt told not to take because of bypass surgery   Hydrocodone-Acetaminophen Rash   Repatha [Evolocumab]     Weak, worn out   Statins Other (See Comments)    Leg cramps and makes patient feel bad. Per pt tried multiple in years past   Sulfonamide Derivatives Rash    Consent Signed: Yes.    Is patient diabetic? No.  CBG today?   Pregnant: No. LMP: No LMP for male patient. (age 47-55)  Anticoagulants: no Anti-inflammatory: no Antibiotics: no  Procedure: left genicular nerve block  Position: Supine Start Time: 10:09 End Time: 10:20  Fluoro Time: 42 sec  RN/CMA Designer, multimedia    Time 9:44 10:27    BP 113/53 145/73    Pulse 43 75    Respirations 14 16    O2 Sat 92 94    S/S 6 6    Pain Level 5/10 3/10     D/C home with fe, patient A & O X 3, D/C instructions reviewed, and sits independently.

## 2022-12-18 ENCOUNTER — Telehealth: Payer: Self-pay | Admitting: Physical Medicine & Rehabilitation

## 2022-12-18 NOTE — Telephone Encounter (Signed)
Patient called to report relief from procedure yesterday. He would like schedule next procedure. Please advise.

## 2022-12-21 ENCOUNTER — Telehealth: Payer: Self-pay | Admitting: Cardiology

## 2022-12-21 NOTE — Telephone Encounter (Signed)
STAT if HR is under 50 or over 120 (normal HR is 60-100 beats per minute)  What is your heart rate? Patient said today he went to the doctor and his heart rate was 44- he said the last few times before that, it was in the forties  Do you have a log of your heart rate readings (document readings)?   Do you have any other symptoms? Very tired, exhausted and sleepy

## 2022-12-21 NOTE — Telephone Encounter (Signed)
Last week, went to [facility]- hr= 44, then went up to 76 after a little while- when they rechecked it. It was 44 today when they checked his hr at home- when he was feeling dizzy/tired feeling. (This was about 30 minutes after taking hydrochlorothiazide.  He reports that this is not the first time this has happened. He had a few low readings over the past month or 2. Reports being tired/easily fatigued. He does not get sob, no chest pain; he does report that on and off throughout the day he is dizzy/off balance. He does not get so dizzy that he feels like he will fall or pass out.  (He takes hydrochlorothiazide- at lunch time and Valsartan at bedtime).   Instructed the pt to keep a log of BP and HR- check in morning and evening- also check when feeling symptomatic. I will send this information to your provider and we will get back to you with any recommendations. Given ER precautions. He verbalized understanding.

## 2022-12-22 ENCOUNTER — Encounter: Payer: Self-pay | Admitting: Family Medicine

## 2022-12-22 ENCOUNTER — Ambulatory Visit: Payer: PPO | Admitting: Family Medicine

## 2022-12-22 VITALS — BP 108/58 | HR 85 | Ht 66.0 in | Wt 211.0 lb

## 2022-12-22 DIAGNOSIS — R82998 Other abnormal findings in urine: Secondary | ICD-10-CM | POA: Diagnosis not present

## 2022-12-22 DIAGNOSIS — T8484XD Pain due to internal orthopedic prosthetic devices, implants and grafts, subsequent encounter: Secondary | ICD-10-CM

## 2022-12-22 DIAGNOSIS — M5442 Lumbago with sciatica, left side: Secondary | ICD-10-CM

## 2022-12-22 DIAGNOSIS — M2550 Pain in unspecified joint: Secondary | ICD-10-CM

## 2022-12-22 DIAGNOSIS — Z96652 Presence of left artificial knee joint: Secondary | ICD-10-CM | POA: Diagnosis not present

## 2022-12-22 DIAGNOSIS — G8929 Other chronic pain: Secondary | ICD-10-CM

## 2022-12-22 LAB — CBC WITH DIFFERENTIAL/PLATELET
Basophils Absolute: 0.1 10*3/uL (ref 0.0–0.1)
Basophils Relative: 1 % (ref 0.0–3.0)
Eosinophils Absolute: 0.3 10*3/uL (ref 0.0–0.7)
Eosinophils Relative: 3.8 % (ref 0.0–5.0)
HCT: 43.2 % (ref 39.0–52.0)
Hemoglobin: 14.5 g/dL (ref 13.0–17.0)
Lymphocytes Relative: 21.9 % (ref 12.0–46.0)
Lymphs Abs: 1.6 10*3/uL (ref 0.7–4.0)
MCHC: 33.7 g/dL (ref 30.0–36.0)
MCV: 91.7 fL (ref 78.0–100.0)
Monocytes Absolute: 0.8 10*3/uL (ref 0.1–1.0)
Monocytes Relative: 10.9 % (ref 3.0–12.0)
Neutro Abs: 4.7 10*3/uL (ref 1.4–7.7)
Neutrophils Relative %: 62.4 % (ref 43.0–77.0)
Platelets: 260 10*3/uL (ref 150.0–400.0)
RBC: 4.71 Mil/uL (ref 4.22–5.81)
RDW: 13.1 % (ref 11.5–15.5)
WBC: 7.5 10*3/uL (ref 4.0–10.5)

## 2022-12-22 LAB — COMPREHENSIVE METABOLIC PANEL
ALT: 18 U/L (ref 0–53)
AST: 17 U/L (ref 0–37)
Albumin: 4 g/dL (ref 3.5–5.2)
Alkaline Phosphatase: 60 U/L (ref 39–117)
BUN: 20 mg/dL (ref 6–23)
CO2: 32 meq/L (ref 19–32)
Calcium: 9.7 mg/dL (ref 8.4–10.5)
Chloride: 100 meq/L (ref 96–112)
Creatinine, Ser: 1.35 mg/dL (ref 0.40–1.50)
GFR: 49.82 mL/min — ABNORMAL LOW (ref >60.00)
Glucose, Bld: 110 mg/dL — ABNORMAL HIGH (ref 70–99)
Potassium: 4.2 meq/L (ref 3.5–5.1)
Sodium: 137 meq/L (ref 135–145)
Total Bilirubin: 0.7 mg/dL (ref 0.2–1.2)
Total Protein: 6.7 g/dL (ref 6.0–8.3)

## 2022-12-22 LAB — URINALYSIS
Bilirubin Urine: NEGATIVE
Hgb urine dipstick: NEGATIVE
Ketones, ur: NEGATIVE
Leukocytes,Ua: NEGATIVE
Nitrite: NEGATIVE
Specific Gravity, Urine: 1.015 (ref 1.000–1.030)
Total Protein, Urine: NEGATIVE
Urine Glucose: NEGATIVE
Urobilinogen, UA: 0.2 (ref 0.0–1.0)
pH: 6.5 (ref 5.0–8.0)

## 2022-12-22 MED ORDER — KETOROLAC TROMETHAMINE 60 MG/2ML IM SOLN
60.0000 mg | Freq: Once | INTRAMUSCULAR | Status: AC
Start: 2022-12-22 — End: 2022-12-22
  Administered 2022-12-22: 60 mg via INTRAMUSCULAR

## 2022-12-22 MED ORDER — METHYLPREDNISOLONE ACETATE 80 MG/ML IJ SUSP
80.0000 mg | Freq: Once | INTRAMUSCULAR | Status: AC
Start: 2022-12-22 — End: 2022-12-22
  Administered 2022-12-22: 80 mg via INTRAMUSCULAR

## 2022-12-22 NOTE — Progress Notes (Signed)
[name] [address] [place] [address] Phone: [phone] Subjective:   Joshua [name], am serving as a scribe for Dr. Antoine Primas.  I'm seeing this patient by the request  of:  Shelva Majestic, MD  CC:  Low back pain [id number]  Joshua [name] is a 82 y.o. male coming in with complaint of low back pain. Patient was seen by another provider recently and did the geniculate block on the left knee.  Patient is to get prior authorization for the full ablation. Pain in lower back pain has increased over past 3 weeks. Lying down is most relieving.   Also wants to know if he should proceed with further tests for the L knee.     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"   CAD (coronary artery disease)    CABG 2001   Diverticulosis    DJD (degenerative joint disease)    BACK   GERD (gastroesophageal reflux disease)    H/O inguinal hernia repair 12/2018   History of gout    Hyperlipidemia    Hypertension    IBS (irritable bowel syndrome)    Obesity    OSA (obstructive sleep apnea)    USES CPAP    Pancreatitis ~ 1980   Peripheral vascular disease (HCC)    Pre-diabetes    Prostate cancer (HCC) 04/2010   Past Surgical History:  Procedure Laterality Date   ANTERIOR CERVICAL DECOMP/DISCECTOMY FUSION  05/26/2006   PLATE PLACED   BACK SURGERY     CARDIAC CATHETERIZATION  11/20/1999   CORONARY ARTERY BYPASS GRAFT  11/20/1999   SVG-RI1-RI2, SVG-OM, SVG-dRCA   CORONARY STENT INTERVENTION N/A 04/13/2019   Procedure: CORONARY STENT INTERVENTION;  Surgeon: Kathleene Hazel, MD;  Location: [facility];  Service: Cardiovascular;  Laterality: N/A;   HERNIA REPAIR     INGUINAL HERNIA REPAIR Left 01/02/2019   Procedure: OPEN  LEFT INGUINAL HERNIA REPAIR WITH MESH;  Surgeon: Luretha Murphy, MD;  Location: [facility];  Service: General;  Laterality: Left;   KNEE ARTHROSCOPY Right 1992; 04/2008   LAPAROSCOPIC CHOLECYSTECTOMY  2007   LEFT HEART CATH AND CORS/GRAFTS ANGIOGRAPHY N/A 04/13/2019   Procedure: LEFT HEART CATH AND CORS/GRAFTS ANGIOGRAPHY;  Surgeon: Kathleene Hazel, MD;  Location: [facility];  Service: Cardiovascular;  Laterality: N/A;   NASAL SEPTUM SURGERY Right 2007   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   ROBOT ASSISTED LAPAROSCOPIC RADICAL PROSTATECTOMY  04/2010   THYROIDECTOMY, PARTIAL  1980   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   Procedure: LEFT TOTAL KNEE ARTHROPLASTY;  Surgeon: Marcene Corning, MD;  Location: [facility];  Service: Orthopedics;  Laterality: Left;   Social History   Socioeconomic History   Marital status: Married    Spouse name: Not on file   Number of children: Not on file   Years of education: Not on file   Highest education level: Associate degree: occupational, Scientist, product/process development, or vocational program  Occupational History   Occupation: retired    Associate Professor: RETIRED  Tobacco Use   Smoking status: Former    Current packs/day: 0.00    Average packs/day: 1 pack/day for 35.0 years (35.0 ttl pk-yrs)    Types: Cigarettes  Start date: 06/16/1957    Quit date: 06/16/1992    Years since quitting: 30.5   Smokeless tobacco: Never  Vaping Use   Vaping status: Never Used  Substance and Sexual Activity   Alcohol use: Yes    Alcohol/week: 2.0 standard drinks of alcohol    Types: 2 Shots of liquor per week    Comment: occ   Drug use: No   Sexual activity: Not Currently    Partners: Female  Other Topics Concern   Not on file  Social History Narrative   Married 42 years in 2015. 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      Hobbies: poker, movies and tv, staying active      Right handed   2 level home with  steps he uses   Social Determinants of Corporate investment banker Strain: Low Risk  (07/10/2022)   Overall Financial Resource Strain (CARDIA)    Difficulty of Paying Living Expenses: Not hard at all  Food Insecurity: No Food Insecurity (07/10/2022)   Hunger Vital Sign    Worried About Running Out of Food in the Last Year: Never true    Ran Out of Food in the Last Year: Never true  Transportation Needs: No Transportation Needs (07/10/2022)   PRAPARE - Administrator, Civil Service (Medical): No    Lack of Transportation (Non-Medical): No  Physical Activity: Insufficiently Active (07/10/2022)   Exercise Vital Sign    Days of Exercise per Week: 1 day    Minutes of Exercise per Session: 10 min  Stress: No Stress Concern Present (07/10/2022)   [employer] of Occupational Health - Occupational Stress Questionnaire    Feeling of Stress : Only a little  Social Connections: Moderately Isolated (07/10/2022)   Social Connection and Isolation Panel [NHANES]    Frequency of Communication with Friends and Family: More than three times a week    Frequency of Social Gatherings with Friends and Family: Three times a week    Attends Religious Services: Never    Active Member of Clubs or Organizations: No    Attends Banker Meetings: Never    Marital Status: Married   Allergies  Allergen Reactions   Codeine Other (See Comments)    Trouble breathing   Lexapro [Escitalopram]     Dizziness   Bromfed Nausea And Vomiting   Clarithromycin Other (See Comments)     gastritis   Cymbalta [Duloxetine Hcl]     Abdominal pain and later chest pain as well as worsening fatigue    Doxycycline Hyclate Nausea And Vomiting   Modafinil Other (See Comments)     anxiety-nervousness   Oxycodone-Acetaminophen Itching   Promethazine Hcl Other (See Comments)     fainting   Quinolones Nausea Only    Cipro "felt real bad"   Telithromycin Nausea And Vomiting   Tramadol Nausea Only    Benadryl [Diphenhydramine] Other (See Comments)    Pt told not to take because of bypass surgery   Hydrocodone-Acetaminophen Rash   Repatha [Evolocumab]     Weak, worn out   Statins Other (See Comments)    Leg cramps and makes patient feel bad. Per pt tried multiple in years past   Sulfonamide Derivatives Rash   Family History  Problem Relation Age of Onset   Heart disease Mother    Hyperlipidemia Mother  Heart disease Father    Hyperlipidemia Father    Diabetes Brother    Colon cancer Neg Hx    Pancreatic cancer Neg Hx    Esophageal cancer Neg Hx    Stomach cancer Neg Hx    Liver cancer Neg Hx      Current Outpatient Medications (Cardiovascular):    Alirocumab (PRALUENT) 150 MG/ML SOAJ, INJECT 1 DOSE INTO THE SKIN EVERY 14 (FOURTEEN) DAYS.   amLODipine (NORVASC) 5 MG tablet, TAKE 1 TABLET (5 MG TOTAL) BY MOUTH DAILY.   EPINEPHrine 0.3 mg/0.3 mL IJ SOAJ injection, Inject 0.3 mg into the muscle as needed for anaphylaxis. for allergic reaction   ezetimibe (ZETIA) 10 MG tablet, TAKE 1 TABLET BY MOUTH EVERY DAY   hydrochlorothiazide (HYDRODIURIL) 25 MG tablet, TAKE 1 TABLET BY MOUTH EVERY DAY   nitroGLYCERIN (NITROSTAT) 0.4 MG SL tablet, Place 1 tablet (0.4 mg total) under the tongue every 5 (five) minutes as needed for chest pain.   valsartan (DIOVAN) 320 MG tablet, TAKE 1 TABLET BY MOUTH EVERY DAY  Current Outpatient Medications (Respiratory):    albuterol (PROAIR HFA) 108 (90 Base) MCG/ACT inhaler, Inhale 1-2 puffs into the lungs every 6 (six) hours as needed for wheezing or shortness of breath.   fluticasone (FLONASE) 50 MCG/ACT nasal spray, Place 1 spray into both nostrils daily.   Fluticasone Furoate (ARNUITY ELLIPTA) 100 MCG/ACT AEPB, Inhale 1 Inhalation into the lungs daily.  Current Outpatient Medications (Analgesics):    acetaminophen (TYLENOL) 500 MG tablet, Take 1,000 mg by mouth every 6 (six) hours as needed for moderate pain or headache.   allopurinol (ZYLOPRIM)  100 MG tablet, Take 1 tablet (100 mg total) by mouth daily. History of gout, this lowers uric acid and risk of recurrence   aspirin EC 81 MG tablet, Take 1 tablet (81 mg total) by mouth 2 (two) times daily after a meal.   traMADol (ULTRAM) 50 MG tablet, Take 1 tablet (50 mg total) by mouth every 6 (six) hours as needed (no driving 6 hours after taking and do not take within 6 hours of diazepam).  Current Outpatient Medications (Hematological):    Cyanocobalamin (B-12) 2500 MCG TABS, Take 1,250 mcg by mouth daily.   folic acid (FOLVITE) 800 MCG tablet, Take 800 mcg by mouth daily.   Current Outpatient Medications (Other):    cholecalciferol (VITAMIN D3) 25 MCG (1000 UNIT) tablet, Take 1,000 Units by mouth daily.   clobetasol cream (TEMOVATE) 0.05 %, APPLY TO AFFECTED AREA TWICE A DAY See Dr. Durene Cal soon and discuss about further refills.   clotrimazole-betamethasone (LOTRISONE) cream, Apply 1 Application topically 2 (two) times daily.   diazepam (VALIUM) 5 MG tablet, TAKE 1 TABLET (5 MG TOTAL) BY MOUTH AT BEDTIME AS NEEDED (SLEEP).   gabapentin (NEURONTIN) 300 MG capsule, Take 2 cap in AM, 1 cap at noon, 2caps at bedtime   Glucos-Chond-Hyal Ac-Ca Fructo (MOVE FREE JOINT HEALTH ADVANCE PO), Take 1 tablet by mouth daily.   Lidocaine HCl (ASPERCREME LIDOCAINE) 4 % CREA, Apply topically in the morning and at bedtime.   Miconazole Nitrate (LOTRIMIN AF DEODORANT POWDER) 2 % AERP, Apply 1 spray topically daily as needed (jock itch).   mirabegron ER (MYRBETRIQ) 50 MG TB24 tablet, Take 1 tablet (50 mg total) by mouth daily.   nystatin (MYCOSTATIN/NYSTOP) powder, Apply 1 Application topically 3 (three) times daily.   nystatin ointment (MYCOSTATIN), Apply 1 Application topically 2 (two) times daily.   pantoprazole (PROTONIX) 40 MG tablet, TAKE 1 TABLET  BY MOUTH EVERY DAY   polyethylene glycol powder (GLYCOLAX/MIRALAX) powder, Take 17 g by mouth daily. (Patient taking differently: Take 17 g by mouth in the  morning and at bedtime.)   Probiotic Product (PROBIOTIC PO), Take by mouth in the morning.   Wheat Dextrin (BENEFIBER) POWD, Take 1 Dose by mouth daily. 1 dose = 2 teaspoons   Reviewed prior external information including notes and imaging from  primary care provider As well as notes that were available from care everywhere and other healthcare systems.  Past medical history, social, surgical and family history all reviewed in electronic medical record.  No pertanent information unless stated regarding to the chief complaint.   Review of Systems:  No headache, visual changes, nausea, vomiting, diarrhea, constipation, dizziness, abdominal pain, skin rash, fevers, chills, night sweats, weight loss, swollen lymph nodes,  joint swelling, chest pain, shortness of breath, mood changes. POSITIVE muscle aches, body aches  Objective  Blood pressure (!) 108/58, pulse 85, height 5\' 6"  (1.676 m), weight 211 lb (95.7 kg), SpO2 98%.   General: No apparent distress alert and oriented x3 mood and affect normal, dressed appropriately.  HEENT: Pupils equal, extraocular movements intact  Respiratory: Patient's speak in full sentences and does not appear short of breath  Cardiovascular: No lower extremity edema, non tender, no erythema  Low back exam shows patient does have some difficulty standing up straight.  Tenderness with direct exam as well.  Greatly distended but does have some voluntary guarding. Negative radicular symptoms at the moment.    Impression and Recommendations:    The above documentation has been reviewed and is accurate and complete Judi Saa, DO

## 2022-12-22 NOTE — Assessment & Plan Note (Signed)
As stated previously.  Has not made significant strides at this time with the back pain.  Continue to make increasing discomfort and will monitor.  Follow-up again in 6 to 8 weeks

## 2022-12-22 NOTE — Telephone Encounter (Signed)
Left message for patient of dr hochrein's recommendations. 

## 2022-12-22 NOTE — Assessment & Plan Note (Signed)
Responded to the initial geniculate block and is going to have radiofrequency ablation of this area in the near future.

## 2022-12-22 NOTE — Assessment & Plan Note (Signed)
Previously has had stones and does have some mild CVA tenderness, will check c-Met as well as a urinalysis.  Will see if what is potentially contributing to some of his back pain.  Discussed with patient and decided because he is in so much pain he will deal with the steroid injections side effects as well as take the Toradol.  Given those as well and hopefully this will make significant improvement.  Discussed icing regimen and home exercises.  Follow-up again in 4 weeks

## 2022-12-22 NOTE — Patient Instructions (Addendum)
Cocktail injection today Labs today Send [place] update on Friday

## 2022-12-31 ENCOUNTER — Telehealth: Payer: Self-pay | Admitting: Neurology

## 2022-12-31 ENCOUNTER — Encounter: Payer: Self-pay | Admitting: Neurology

## 2022-12-31 NOTE — Telephone Encounter (Signed)
Patient called stating that he has pain on his scrotum , he was told to call his neurologist .

## 2022-12-31 NOTE — Telephone Encounter (Signed)
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 Pt was very upset with me when he got off the phone

## 2023-01-01 DIAGNOSIS — N35013 Post-traumatic anterior urethral stricture: Secondary | ICD-10-CM | POA: Diagnosis not present

## 2023-01-01 DIAGNOSIS — N3946 Mixed incontinence: Secondary | ICD-10-CM | POA: Diagnosis not present

## 2023-01-01 DIAGNOSIS — N5082 Scrotal pain: Secondary | ICD-10-CM | POA: Diagnosis not present

## 2023-01-01 NOTE — Telephone Encounter (Signed)
Replied on MyChart

## 2023-01-05 ENCOUNTER — Other Ambulatory Visit: Payer: Self-pay

## 2023-01-05 ENCOUNTER — Ambulatory Visit: Payer: PPO | Admitting: Family Medicine

## 2023-01-05 ENCOUNTER — Ambulatory Visit (INDEPENDENT_AMBULATORY_CARE_PROVIDER_SITE_OTHER): Payer: PPO

## 2023-01-05 VITALS — BP 122/66 | HR 86 | Ht 66.0 in | Wt 208.0 lb

## 2023-01-05 DIAGNOSIS — M25551 Pain in right hip: Secondary | ICD-10-CM

## 2023-01-05 DIAGNOSIS — G8929 Other chronic pain: Secondary | ICD-10-CM

## 2023-01-05 DIAGNOSIS — M25561 Pain in right knee: Secondary | ICD-10-CM | POA: Diagnosis not present

## 2023-01-05 DIAGNOSIS — M25562 Pain in left knee: Secondary | ICD-10-CM

## 2023-01-05 DIAGNOSIS — M1611 Unilateral primary osteoarthritis, right hip: Secondary | ICD-10-CM | POA: Diagnosis not present

## 2023-01-05 NOTE — Patient Instructions (Addendum)
Thank you for coming in today.  You received an injection today. Seek immediate medical attention if the joint becomes red, extremely painful, or is oozing fluid.   I've referred you to Radiology for a consultation on a Genicular Artery Embolization procedure.  Let us know if you don't hear from them in one week.    Please get an Xray today before you leave

## 2023-01-05 NOTE — Progress Notes (Signed)
Rubin Payor, PhD, LAT, ATC acting as a scribe for Clementeen Graham, MD.  Joshua [name] is a 82 y.o. male who presents to [facility] today for R hip and R knee pain. Pt was last seen by Dr. Katrinka Blazing on 12/22/22 for polyarthralgia and LBP. R knee PRP injection on 10/28/22 and Monovisc on 07/24/22.  Today, pt reports no benefit from prior PRP injections. He is now ing having R hip pain, onset about 1 month. He c/o pain waking him up in the middle of the night. He locates pain to the lateral aspect of his R hip. Pain will radiate along the lateral aspect of his R thigh. No numbness/tingling noted.   He notes bilateral knee pain.  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  He has had trials of hyaluronic acid injections and PRP injections with little benefit. His left knee has been replaced and still is painful.  He had the first part of a geniculate nerve block and is scheduled for an embolization at the end of the month.  Dx testing: 06/25/22 R knee XR  Pertinent review of systems: No fevers or chills  Relevant historical information: Coronary artery bypass graft.  Left knee replacement.   Exam:  BP 122/66   Pulse 86   Ht 5\' 6"  (1.676 m)   Wt 208 lb (94.3 kg)   SpO2 94%   BMI 33.57 kg/m  General: Well Developed, well nourished, and in no acute distress.   MSK: Right hip: Normal-appearing Normal motion. Tender palpation greater trochanter. Hip abduction strength is diminished.  Right knee mild effusion normal motion with crepitation.    Lab and Radiology Results  Procedure: Real-time Ultrasound Guided Injection of right lateral hip greater trochanter bursa Device: Philips Affiniti 50G/GE Logiq Images permanently stored and available for review in PACS Verbal informed consent obtained.  Discussed risks and benefits of procedure. Warned about infection, bleeding, hyperglycemia damage to structures among others. Patient expresses understanding and  agreement Time-out conducted.   Noted no overlying erythema, induration, or other signs of local infection.   Skin prepped in a sterile fashion.   Local anesthesia: Topical Ethyl chloride.   With sterile technique and under real time ultrasound guidance: 40 mg of Kenalog and 2 mL of Marcaine injected into bursa. Fluid seen entering the bursa.   Completed without difficulty   Pain immediately resolved suggesting accurate placement of the medication.   Advised to call if fevers/chills, erythema, induration, drainage, or persistent bleeding.   Images permanently stored and available for review in the ultrasound unit.  Impression: Technically successful ultrasound guided injection.    X-ray images right hip obtained today personally and independently interpreted Mild right hip DJD with some calcifications around the superior portion of the hip joint indicating possible labrum tear.  Enthesiopathy changes at greater trochanter.  No acute fractures are visible. Await formal radiology review    Assessment and Plan: 82 y.o. male with right lateral hip pain thought to be predominantly due to hip abductor tendinopathy and trochanteric bursitis.  Plan for steroid injection today and continued home exercise program.  He has tried physical therapy for this in the past and did not think it helped very much.  Bilateral knee pain.  Right knee due to DJD.  Left knee status post knee replacement.  He has recalcitrant pain.  We discussed genicular artery embolization.  Placed referral for consultation with interventional radiology to discuss this procedure.  May be helpful for both.  PDMP not reviewed this encounter. Orders Placed This Encounter  Procedures   [place] LIMITED JOINT SPACE STRUCTURES LOW RIGHT(NO LINKED CHARGES)    Order Specific Question:   Reason for Exam (SYMPTOM  OR DIAGNOSIS REQUIRED)    Answer:   right hip pain    Order Specific Question:   Preferred imaging location?    Answer:    [facility]    Standing Status:   Future    Standing Expiration Date:   01/05/2024    Scheduling Instructions:     Genicular Artery Embolization    Order Specific Question:   Reason for Exam (SYMPTOM  OR DIAGNOSIS REQUIRED)    Answer:   right knee pain    Order Specific Question:   Preferred imaging location?    Answer:   [facility]   DG HIP UNILAT W OR W/O PELVIS 2-3 VIEWS RIGHT    Standing Status:   Future    Number of Occurrences:   1    Standing Expiration Date:   02/05/2023    Order Specific Question:   Reason for Exam (SYMPTOM  OR DIAGNOSIS REQUIRED)    Answer:   right hip pain    Order Specific Question:   Preferred imaging location?    Answer:   [facility]   No orders of the defined types were placed in this encounter.    Discussed warning signs or symptoms. Please see discharge instructions. Patient expresses understanding.   The above documentation has been reviewed and is accurate and complete Clementeen Graham, M.D.

## 2023-01-06 ENCOUNTER — Telehealth: Payer: Self-pay | Admitting: Neurology

## 2023-01-06 ENCOUNTER — Telehealth: Payer: Self-pay | Admitting: Family Medicine

## 2023-01-06 DIAGNOSIS — N5082 Scrotal pain: Secondary | ICD-10-CM | POA: Diagnosis not present

## 2023-01-06 NOTE — Telephone Encounter (Signed)
Spoke with Joshua [name] informed her that pain management will treat the scrotal pain he needs to read his my chart message and follow up with his pain management Dr

## 2023-01-06 NOTE — Telephone Encounter (Signed)
The NP with [facility] called in. The pt has been having scrotal pain for 6 months. He had prostate surgery a long time ago. All of his providers are saying they don't see scrotal pain. He has seen Urology, Neurology, dermatology, and pain management. At this point no one is treating him.

## 2023-01-06 NOTE — Telephone Encounter (Signed)
Caller is Ramie, NP with [facility].   Caller states that patient called them out to his home but they aren't able to do much when it comes to chronic issues such as his scrotal pain. States he is still experiencing pain and although he was given some pain relief gel he was informed not to use it as often since it could have side effects. Additionally patient was referred to urology who then referred him to pain management. Pain management informed him that they don't manage scrotum pain.   Caller states they can try to coordinate care with chronic issues but they wouldn't be able to do much such as ordering any of the imaging or labs that he'd need. Ramie can be reached @ [phone] for additional information.

## 2023-01-07 ENCOUNTER — Telehealth: Payer: Self-pay | Admitting: Physical Medicine & Rehabilitation

## 2023-01-07 DIAGNOSIS — G4733 Obstructive sleep apnea (adult) (pediatric): Secondary | ICD-10-CM | POA: Diagnosis not present

## 2023-01-07 NOTE — Telephone Encounter (Signed)
Patient called in requesting advice , patient has new pain concern - scrotum pain and the has seen different doctors and lastly his neurologist told him to call our office since we assisting with pain management . He also mentioned that he is not sure if he wants to do the procedure and might reschedule to a different date

## 2023-01-08 ENCOUNTER — Telehealth: Payer: Self-pay | Admitting: Family Medicine

## 2023-01-08 ENCOUNTER — Telehealth: Payer: Self-pay

## 2023-01-08 NOTE — Addendum Note (Signed)
Addended by: Debbe Odea R on: 01/08/2023 01:57 PM   Modules accepted: Orders

## 2023-01-08 NOTE — Telephone Encounter (Signed)
Patient called stating he was to be sent to a Radiologist for an appointment? I see an order for something but I am not sure what exactly it is. It is not placed as a referral in my work queue for me to send/fax an order to anyone. Patient states he has not heard from anyone yet to schedule and he wanted to get a call back to talk about how this process works. I didn't know how to help the patient.

## 2023-01-08 NOTE — Telephone Encounter (Signed)
Pt is confused about upcoming procedures he has scheduled.  He is already scheduled with [facility] for an ablation of his L knee.  He is now scheduled with [facility] for a consult for a GAE on his L knee also. But the order read R knee, pt and notes seem to be L knee.  Pt unsure which procedure he should be doing. Wanted to get both Drs. Smith and Corey's opinion on how to proceed.

## 2023-01-08 NOTE — Telephone Encounter (Signed)
Called and spoke with patient, provided number to [facility] so he could contact them directly to schedule.

## 2023-01-11 NOTE — Telephone Encounter (Signed)
Called pt and advised per Dr. Denyse Amass and Dr. Katrinka Blazing. Pt will plan to r/s ablation until after eval with IR.

## 2023-01-11 NOTE — Telephone Encounter (Signed)
I believe the embolization is supposed to be the left knee.  I think I may have put the order in on the wrong knee.  My apologies if that is the case.  The idea was to have a consultation with the radiologist about the embolization.  You could do either the nerve ablation or the artery embolization.

## 2023-01-14 IMAGING — XA Imaging study
2 series · 2 of 2 positions shown · non-contrast
Comparison: none

CLINICAL DATA: Lumbosacral spondylosis without myelopathy. Left
greater than right low back pain. Over 6 months of relief from last
epidural injection in February 2019. Symptoms have recurred.

[Series 1: ortho adipose · 1 of 1 slices shown (1 of 2)]
[im 1/1]
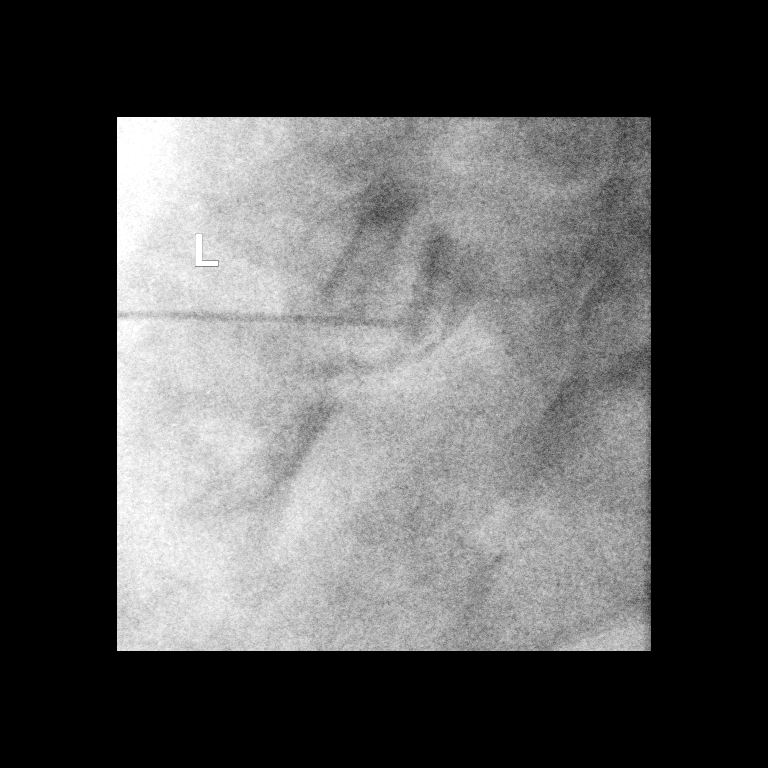

[Series 2: ortho adipose · 1 of 1 slices shown (2 of 2)]
[im 1/1]
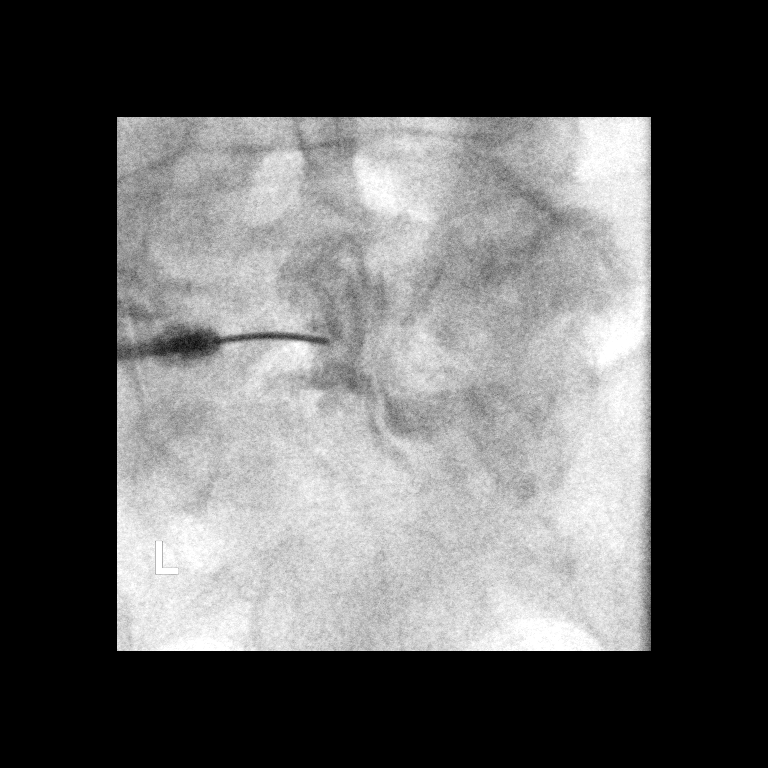

[2 of 2 positions shown; findings below may reference images not displayed]

FLUOROSCOPY TIME:  Radiation Exposure Index (as provided by the
fluoroscopic device): 3.8 mGy

Fluoroscopy Time:  16 seconds

Number of Acquired Images:  0

PROCEDURE:
The procedure, risks, benefits, and alternatives were explained to
the patient. Questions regarding the procedure were encouraged and
answered. The patient understands and consents to the procedure.

LUMBAR EPIDURAL INJECTION:

An interlaminar approach was performed on the left at L5-S1. The
overlying skin was cleansed and anesthetized. A 3.5 inch 20 gauge
epidural needle was advanced using loss-of-resistance technique.

DIAGNOSTIC EPIDURAL INJECTION:

Injection of Isovue-M 200 shows a good epidural pattern with spread
above and below the level of needle placement to both sides. No
vascular opacification is seen.

THERAPEUTIC EPIDURAL INJECTION:

120 mg of Depo-Medrol mixed with 3 mL of 1% lidocaine were
instilled. The procedure was well-tolerated, and the patient was
discharged thirty minutes following the injection in good condition.

COMPLICATIONS:
None immediate.
IMPRESSION: Technically successful interlaminar epidural injection on the left
at L5-S1.

## 2023-01-15 DIAGNOSIS — H2513 Age-related nuclear cataract, bilateral: Secondary | ICD-10-CM | POA: Diagnosis not present

## 2023-01-15 DIAGNOSIS — H25013 Cortical age-related cataract, bilateral: Secondary | ICD-10-CM | POA: Diagnosis not present

## 2023-01-15 DIAGNOSIS — N5082 Scrotal pain: Secondary | ICD-10-CM | POA: Diagnosis not present

## 2023-01-15 DIAGNOSIS — H52203 Unspecified astigmatism, bilateral: Secondary | ICD-10-CM | POA: Diagnosis not present

## 2023-01-15 DIAGNOSIS — H5203 Hypermetropia, bilateral: Secondary | ICD-10-CM | POA: Diagnosis not present

## 2023-01-19 ENCOUNTER — Encounter: Payer: PPO | Admitting: Physical Medicine & Rehabilitation

## 2023-01-25 DIAGNOSIS — N5082 Scrotal pain: Secondary | ICD-10-CM | POA: Diagnosis not present

## 2023-01-29 NOTE — Progress Notes (Signed)
Right hip x-ray shows hip arthritis

## 2023-02-02 ENCOUNTER — Encounter: Payer: Self-pay | Admitting: Family Medicine

## 2023-02-02 ENCOUNTER — Other Ambulatory Visit: Payer: Self-pay | Admitting: Cardiology

## 2023-02-02 ENCOUNTER — Other Ambulatory Visit: Payer: Self-pay | Admitting: Internal Medicine

## 2023-02-02 ENCOUNTER — Other Ambulatory Visit: Payer: Self-pay | Admitting: Family Medicine

## 2023-02-03 NOTE — Progress Notes (Signed)
Chief Complaint: Patient was seen in consultation today for bilateral knee pain.   Referring Physician(s): Corey,Evan S  History of Present Illness: Joshua [name] is a 82 y.o. male with a medical history significant for CAD (s/p CABG), gout, HTN, bladder cancer, prostate cancer, obesity, PVD, low back pain and arthritis. He is s/p left total knee arthroplasty in 2022 but continues to have left knee pain as well as pain in the right knee. He was evaluated by Dr. Denyse Amass in October for right hip pain and right knee pain. He has had prior right knee injections with PRP and Monovisc which were ineffective.  He recently had a geniculate block to the left knee and has been referred to Interventional Radiology for possible embolization.   He presents to the outpatient clinic today for further discussion of geniculate artery embolization.  He has never had significant pain relief in the left knee since TKA.  This has progressed over the past several months.  He used to still be able to exercise at the gym but is no longer able to.  He takes tylenol for his pain, which only helps a little.  He has considered nerve ablation to the left knee.  His right knee bothers him, but not nearly as much as his left.    W[place] Pain Score = 57/96 VAS Pain Score = 7/10  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"   CAD (coronary artery disease)    CABG 2001   Diverticulosis    DJD (degenerative joint disease)    BACK   GERD (gastroesophageal reflux disease)    H/O inguinal hernia repair 12/2018   History of gout    Hyperlipidemia    Hypertension    IBS (irritable bowel syndrome)    Obesity    OSA (obstructive sleep apnea)    USES CPAP    Pancreatitis ~ 1980   Peripheral vascular disease (HCC)     Pre-diabetes    Prostate cancer (HCC) 04/2010    Past Surgical History:  Procedure Laterality Date   ANTERIOR CERVICAL DECOMP/DISCECTOMY FUSION  05/26/2006   PLATE PLACED   BACK SURGERY     CARDIAC CATHETERIZATION  11/20/1999   CORONARY ARTERY BYPASS GRAFT  11/20/1999   SVG-RI1-RI2, SVG-OM, SVG-dRCA   CORONARY STENT INTERVENTION N/A 04/13/2019   Procedure: CORONARY STENT INTERVENTION;  Surgeon: Kathleene Hazel, MD;  Location: [facility];  Service: Cardiovascular;  Laterality: N/A;   HERNIA REPAIR     INGUINAL HERNIA REPAIR Left 01/02/2019   Procedure: OPEN LEFT INGUINAL HERNIA REPAIR WITH MESH;  Surgeon: Luretha Murphy, MD;  Location: [facility];  Service: General;  Laterality: Left;   KNEE ARTHROSCOPY Right 1992; 04/2008   LAPAROSCOPIC CHOLECYSTECTOMY  2007   LEFT HEART CATH AND CORS/GRAFTS ANGIOGRAPHY N/A 04/13/2019   Procedure: LEFT HEART CATH AND CORS/GRAFTS ANGIOGRAPHY;  Surgeon: Kathleene Hazel, MD;  Location: [facility];  Service: Cardiovascular;  Laterality: N/A;   NASAL SEPTUM SURGERY Right 2007   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   ROBOT ASSISTED LAPAROSCOPIC RADICAL PROSTATECTOMY  04/2010   THYROIDECTOMY, PARTIAL  1980   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   Procedure: LEFT TOTAL KNEE ARTHROPLASTY;  Surgeon: Marcene Corning, MD;  Location: [facility];  Service: Orthopedics;  Laterality: Left;    Allergies: Codeine, Lexapro [escitalopram], Bromfed, Clarithromycin, Cymbalta [duloxetine hcl], Doxycycline hyclate, Modafinil, Oxycodone-acetaminophen, Promethazine hcl, Quinolones, Telithromycin, Tramadol, Benadryl [diphenhydramine], Hydrocodone-acetaminophen, Repatha [evolocumab], Statins, and Sulfonamide derivatives  Medications: Prior to Admission medications   Medication Sig Start Date End Date Taking? Authorizing Provider  acetaminophen (TYLENOL) 500 MG tablet Take 1,000 mg by mouth every 6 (six) hours as needed for moderate pain or headache.     [provider]  albuterol (PROAIR HFA) 108 (90 Base) MCG/ACT inhaler Inhale 1-2 puffs into the lungs every 6 (six) hours as needed for wheezing or shortness of breath. 12/06/21   Dulce Sellar, NP  Alirocumab (PRALUENT) 150 MG/ML SOAJ INJECT 1 DOSE INTO THE SKIN EVERY 14 (FOURTEEN) DAYS. 12/08/22   Hilty, Lisette Abu, MD  allopurinol (ZYLOPRIM) 100 MG tablet Take 1 tablet (100 mg total) by mouth daily. History of gout, this lowers uric acid and risk of recurrence Patient not taking: Reported on 01/05/2023 08/25/22   [name], MD  amLODipine (NORVASC) 5 MG tablet TAKE 1 TABLET (5 MG TOTAL) BY MOUTH DAILY. 04/01/22   Chrystie Nose, MD  aspirin EC 81 MG tablet Take 1 tablet (81 mg total) by mouth 2 (two) times daily after a meal. 06/18/20   Elodia Florence, PA-C  cholecalciferol (VITAMIN D3) 25 MCG (1000 UNIT) tablet Take 1,000 Units by mouth daily.    [provider]  clobetasol cream (TEMOVATE) 0.05 % APPLY TO AFFECTED AREA TWICE A DAY See Dr. Durene Cal soon and discuss about further refills. 04/24/22   [name], MD  clotrimazole-betamethasone (LOTRISONE) cream Apply 1 Application topically 2 (two) times daily. 10/20/22   [provider]  Cyanocobalamin (B-12) 2500 MCG TABS Take 1,250 mcg by mouth daily.    [provider]  diazepam (VALIUM) 5 MG tablet TAKE 1 TABLET (5 MG TOTAL) BY MOUTH AT BEDTIME AS NEEDED (SLEEP). 10/22/22   Shelva Majestic, MD  EPINEPHrine 0.3 mg/0.3 mL IJ SOAJ injection Inject 0.3 mg into the muscle as needed for anaphylaxis. for allergic reaction 04/24/19   [provider]  ezetimibe (ZETIA) 10 MG tablet TAKE 1 TABLET BY MOUTH EVERY DAY 05/05/22   Rollene Rotunda, MD  fluticasone (FLONASE) 50 MCG/ACT nasal spray Place 1 spray into both nostrils daily. 09/05/12   Stacie Glaze, MD  Fluticasone Furoate (ARNUITY ELLIPTA) 100 MCG/ACT AEPB Inhale 1 Inhalation into the lungs daily.    [provider]  folic acid (FOLVITE)  800 MCG tablet Take 800 mcg by mouth daily.     [provider]  gabapentin (NEURONTIN) 300 MG capsule Take 2 cap in AM, 1 cap at noon, 2caps at bedtime 04/27/22   Van Clines, MD  Glucos-Chond-Hyal Ac-Ca Fructo (MOVE FREE JOINT HEALTH ADVANCE PO) Take 1 tablet by mouth daily.    [provider]  hydrochlorothiazide (HYDRODIURIL) 25 MG tablet TAKE 1 TABLET BY MOUTH EVERY DAY 02/02/23   Shelva Majestic, MD  Lidocaine HCl (ASPERCREME LIDOCAINE) 4 % CREA Apply topically in the morning and at bedtime.    [provider]  Miconazole Nitrate (LOTRIMIN AF DEODORANT POWDER) 2 % AERP Apply 1 spray topically daily as needed (jock itch).    [provider]  mirabegron ER (MYRBETRIQ) 50 MG TB24 tablet Take 1 tablet (50 mg total) by mouth daily. 10/01/22   Shelva Majestic, MD  nitroGLYCERIN (NITROSTAT) 0.4 MG SL tablet Place 1 tablet (0.4 mg  total) under the tongue every 5 (five) minutes as needed for chest pain. 12/11/22   Shelva Majestic, MD  nystatin (MYCOSTATIN/NYSTOP) powder Apply 1 Application topically 3 (three) times daily. 08/05/22   [name], MD  nystatin ointment (MYCOSTATIN) Apply 1 Application topically 2 (two) times daily. 08/05/22   [name], MD  pantoprazole (PROTONIX) 40 MG tablet TAKE 1 TABLET BY MOUTH EVERY DAY 07/08/22   Hilty, Lisette Abu, MD  polyethylene glycol powder (GLYCOLAX/MIRALAX) powder Take 17 g by mouth daily. Patient taking differently: Take 17 g by mouth in the morning and at bedtime. 08/29/14   Rolland Porter, MD  Probiotic Product (PROBIOTIC PO) Take by mouth in the morning.    [provider]  traMADol (ULTRAM) 50 MG tablet Take 1 tablet (50 mg total) by mouth every 6 (six) hours as needed (no driving 6 hours after taking and do not take within 6 hours of diazepam). 07/08/22   Shelva Majestic, MD  valsartan (DIOVAN) 320 MG tablet TAKE 1 TABLET BY MOUTH EVERY DAY 04/24/22   Shelva Majestic, MD  Wheat Dextrin (BENEFIBER)  POWD Take 1 Dose by mouth daily. 1 dose = 2 teaspoons    [provider]     Family History  Problem Relation Age of Onset   Heart disease Mother    Hyperlipidemia Mother    Heart disease Father    Hyperlipidemia Father    Diabetes Brother    Colon cancer Neg Hx    Pancreatic cancer Neg Hx    Esophageal cancer Neg Hx    Stomach cancer Neg Hx    Liver cancer Neg Hx     Social History   Socioeconomic History   Marital status: Married    Spouse name: Not on file   Number of children: Not on file   Years of education: Not on file   Highest education level: Associate degree: occupational, Scientist, product/process development, or vocational program  Occupational History   Occupation: retired    Associate Professor: RETIRED  Tobacco Use   Smoking status: Former    Current packs/day: 0.00    Average packs/day: 1 pack/day for 35.0 years (35.0 ttl pk-yrs)    Types: Cigarettes    Start date: 06/16/1957    Quit date: 06/16/1992    Years since quitting: 30.6   Smokeless tobacco: Never  Vaping Use   Vaping status: Never Used  Substance and Sexual Activity   Alcohol use: Yes    Alcohol/week: 2.0 standard drinks of alcohol    Types: 2 Shots of liquor per week    Comment: occ   Drug use: No   Sexual activity: Not Currently    Partners: Female  Other Topics Concern   Not on file  Social History Narrative   Married 42 years in 2015. 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      Hobbies: poker, movies and tv, staying active      Right handed   2 level home with steps he uses   Social Determinants of Corporate investment banker Strain: Low Risk  (07/10/2022)   Overall Financial Resource Strain (CARDIA)    Difficulty of Paying Living Expenses: Not hard at all  Food Insecurity: No Food Insecurity (07/10/2022)   Hunger Vital Sign    Worried About Running Out of Food in the Last Year: Never true  Ran Out of Food in the Last Year: Never  true  Transportation Needs: No Transportation Needs (07/10/2022)   PRAPARE - Administrator, Civil Service (Medical): No    Lack of Transportation (Non-Medical): No  Physical Activity: Insufficiently Active (07/10/2022)   Exercise Vital Sign    Days of Exercise per Week: 1 day    Minutes of Exercise per Session: 10 min  Stress: No Stress Concern Present (07/10/2022)   [employer] of Occupational Health - Occupational Stress Questionnaire    Feeling of Stress : Only a little  Social Connections: Moderately Isolated (07/10/2022)   Social Connection and Isolation Panel [NHANES]    Frequency of Communication with Friends and Family: More than three times a week    Frequency of Social Gatherings with Friends and Family: Three times a week    Attends Religious Services: Never    Active Member of Clubs or Organizations: No    Attends Banker Meetings: Never    Marital Status: Married    Review of Systems: A 12 point ROS discussed and pertinent positives are indicated in the HPI above.  All other systems are negative.  Vital Signs: There were no vitals taken for this visit.  Advance Care Plan: The advanced care plan/surrogate decision maker was discussed at the time of visit and documented in the medical record.   Physical Exam Constitutional:      General: He is not in acute distress. HENT:     Head: Normocephalic.     Mouth/Throat:     Mouth: Mucous membranes are moist.  Eyes:     General: No scleral icterus. Cardiovascular:     Rate and Rhythm: Normal rate and regular rhythm.  Pulmonary:     Effort: Pulmonary effort is normal. No respiratory distress.  Abdominal:     General: There is no distension.  Musculoskeletal:     Right lower leg: No edema.     Left lower leg: No edema.       Legs:     Comments: Tender to palpation  Skin:    General: Skin is warm and dry.  Neurological:     Mental Status: He is alert and oriented to person, place, and  time.     Imaging: Right knee (10/25/22)  Kellgren and Lawrence Grade 2  Left knee (02/17/21)    Labs:  CBC: Recent Labs    08/19/22 0924 09/02/22 0955 11/19/22 0955 12/22/22 1451  WBC 8.0 7.5 8.7 7.5  HGB 14.4 13.8 15.0 14.5  HCT 42.6 41.0 41.3 43.2  PLT 238.0 225.0 203 260.0    COAGS: No results for input(s): "INR", "APTT" in the last 8760 hours.  BMP: Recent Labs    09/02/22 0955 09/18/22 0835 11/19/22 1104 12/22/22 1451  NA 134* 136 131* 137  K 4.3 3.8 4.2 4.2  CL 100 102 99 100  CO2 28 27 25  32  GLUCOSE 85 114* 92 110*  BUN 21 22 21 20   CALCIUM 9.3 9.6 9.4 9.7  CREATININE 1.46 1.40 1.12 1.35  GFRNONAA  --   --  >60  --     LIVER FUNCTION TESTS: Recent Labs    09/02/22 0955 09/18/22 0835 11/19/22 1104 12/22/22 1451  BILITOT 0.7 0.6 0.8 0.7  AST 14 17 19 17   ALT 15 22 24 18   ALKPHOS 57 51 54 60  PROT 6.4 6.5 6.4* 6.7  ALBUMIN 4.0 3.9 4.0 4.0    TUMOR MARKERS: No results for  input(s): "AFPTM", "CEA", "CA199", "CHROMGRNA" in the last 8760 hours.  Assessment and Plan: 82 year old male with a history of bilateral knee osteoarthritis status post left total knee arthoplasty in 2022 with persistent, worsening pain since the operation [facility] 57/96).  He has attempted conservative measures for pain relief without any benefit.  He also has some moderate right knee pain secondary to osteoarthritis (K&G 2).  I believe he would be an excellent candidate for left geniculate artery embolization for post TKA pain, which is predominately medial.  We could consider right GAE at a later date.  We discussed the rationale, risks, benefits, and periprocedural expectations regarding GAE of the left knee and he wishes to proceed.  Plan for left geniculate artery embolization at [facility], plan for antegrade left femoral approach with moderate sedation.    Marliss Coots, MD Pager: [phone]    I spent a total of  40 Minutes   in face to face in clinical  consultation, greater than 50% of which was counseling/coordinating care for bilateral knee pain.

## 2023-02-05 ENCOUNTER — Ambulatory Visit
Admission: RE | Admit: 2023-02-05 | Discharge: 2023-02-05 | Disposition: A | Discharge status: Home / Self Care | Payer: PPO | Source: Ambulatory Visit | Attending: Family Medicine | Admitting: Family Medicine

## 2023-02-05 DIAGNOSIS — M25561 Pain in right knee: Secondary | ICD-10-CM | POA: Diagnosis not present

## 2023-02-05 DIAGNOSIS — M25562 Pain in left knee: Secondary | ICD-10-CM | POA: Diagnosis not present

## 2023-02-05 DIAGNOSIS — Z96652 Presence of left artificial knee joint: Secondary | ICD-10-CM | POA: Diagnosis not present

## 2023-02-05 DIAGNOSIS — G8929 Other chronic pain: Secondary | ICD-10-CM

## 2023-02-05 DIAGNOSIS — M1711 Unilateral primary osteoarthritis, right knee: Secondary | ICD-10-CM | POA: Diagnosis not present

## 2023-02-05 HISTORY — PX: IR RADIOLOGIST EVAL & MGMT: IMG5224

## 2023-02-10 ENCOUNTER — Telehealth: Payer: Self-pay

## 2023-02-10 NOTE — Progress Notes (Signed)
Pharmacy Medication Assistance Program Note    02/10/2023  Patient ID: Joshua [name], male   DOB: 1940/11/05, 82 y.o.   MRN: [id number]     02/10/2023  Outreach Medication One  Manufacturer Medication One [employer] (GSK)  GlaxoSmithKline (GSK) Drugs Other GSK Drug  Type of Assistance Manufacturer Assistance  Date Application Sent to Patient 02/10/2023  Application Items Requested Application;Proof of Out of Pocket Spend      Mailed GSK renewal application to patients home.

## 2023-02-11 ENCOUNTER — Encounter: Payer: Self-pay | Admitting: Family Medicine

## 2023-02-11 ENCOUNTER — Ambulatory Visit: Payer: PPO | Admitting: Family Medicine

## 2023-02-11 VITALS — BP 112/60 | HR 47 | Temp 97.6°F | Ht 66.0 in | Wt 205.6 lb

## 2023-02-11 DIAGNOSIS — I2510 Atherosclerotic heart disease of native coronary artery without angina pectoris: Secondary | ICD-10-CM | POA: Diagnosis not present

## 2023-02-11 DIAGNOSIS — N5082 Scrotal pain: Secondary | ICD-10-CM

## 2023-02-11 DIAGNOSIS — E785 Hyperlipidemia, unspecified: Secondary | ICD-10-CM

## 2023-02-11 DIAGNOSIS — I861 Scrotal varices: Secondary | ICD-10-CM | POA: Diagnosis not present

## 2023-02-11 DIAGNOSIS — I10 Essential (primary) hypertension: Secondary | ICD-10-CM | POA: Diagnosis not present

## 2023-02-11 NOTE — Progress Notes (Signed)
Phone [phone] In person visit   Subjective:   Joshua [name] is a 82 y.o. year old very pleasant male patient who presents for/with See problem oriented charting Chief Complaint  Patient presents with   Follow-up    Pt is here to f/u on scrotum and to discuss GAE.   Past Medical History-  Patient Active Problem List   Diagnosis Date Noted   Scrotal pain 02/11/2023    Priority: High   Coronary atherosclerosis 10/12/2006    Priority: High   Overactive bladder 11/24/2019    Priority: Medium    Hyperglycemia 10/07/2017    Priority: Medium    Gout 11/20/2016    Priority: Medium    CKD (chronic kidney disease), stage III (HCC) 10/23/2013    Priority: Medium    Neuropathy (HCC) 08/21/2013    Priority: Medium    History of bladder cancer 04/01/2010    Priority: Medium    Essential hypertension 02/21/2007    Priority: Medium    Hyperlipidemia 10/12/2006    Priority: Medium    Asthma 10/12/2006    Priority: Medium    Aortic atherosclerosis (HCC) 05/05/2018    Priority: Low   Senile purpura (HCC) 10/07/2017    Priority: Low   DJD (degenerative joint disease)     Priority: Low   Arthritis     Priority: Low   Neck pain 02/08/2017    Priority: Low   Venous insufficiency 12/29/2016    Priority: Low   Ganglion cyst of tendon sheath of right hand 12/29/2016    Priority: Low   Obesity, Class I, BMI 30-34.9 10/03/2015    Priority: Low   Allergic rhinitis 05/31/2014    Priority: Low   Fatigue 11/09/2013    Priority: Low   Dizziness 08/23/2013    Priority: Low   Chronic bilateral low back pain with left-sided sciatica 10/09/2011    Priority: Low   Localized osteoarthrosis, lower leg 10/04/2009    Priority: Low   Tinnitus 05/05/2007    Priority: Low   OSA (obstructive sleep apnea) 11/02/2006    Priority: Low   GERD 10/12/2006    Priority: Low   De Quervain's tenosynovitis, right 10/28/2022   Inhaled foreign object 09/02/2022   Calcium oxalate crystals in  urine 07/30/2022   Balanitis 07/06/2022   Urinary frequency 07/06/2022   Degenerative arthritis of right knee 06/08/2022   Left hamstring injury 04/21/2022   Degenerative cervical disc 01/12/2022   Greater trochanteric bursitis of right hip 02/17/2021   Hyperactive bowel sounds 01/29/2021   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) 09/25/2020   Primary osteoarthritis of left knee 06/18/2020   Pre-operative respiratory examination 06/05/2020   Coronary artery disease involving coronary bypass graft of native heart without angina pectoris 04/07/2020   Trigger thumb, left thumb 01/24/2020   Left lateral epicondylitis 09/05/2019   Educated about COVID-19 virus infection 04/20/2019   Greater trochanteric bursitis of left hip 10/27/2018   Degenerative disc disease, lumbar 10/27/2018   Abdominal muscle strain, initial encounter 06/16/2018   Weakness of left leg 06/21/2017   Hereditary and idiopathic peripheral neuropathy 09/25/2014   Generalized abdominal pain 01/31/2009    Medications- reviewed and updated Current Outpatient Medications  Medication Sig Dispense Refill   acetaminophen (TYLENOL) 500 MG tablet Take 1,000 mg by mouth every 6 (six) hours as needed for moderate pain or headache.     albuterol (PROAIR HFA) 108 (90 Base) MCG/ACT inhaler Inhale 1-2 puffs into the  lungs every 6 (six) hours as needed for wheezing or shortness of breath. 18 g 5   Alirocumab (PRALUENT) 150 MG/ML SOAJ INJECT 1 DOSE INTO THE SKIN EVERY 14 (FOURTEEN) DAYS. 6 mL 3   amLODipine (NORVASC) 5 MG tablet TAKE 1 TABLET (5 MG TOTAL) BY MOUTH DAILY. 90 tablet 3   aspirin EC 81 MG tablet Take 1 tablet (81 mg total) by mouth 2 (two) times daily after a meal. 30 tablet 11   cholecalciferol (VITAMIN D3) 25 MCG (1000 UNIT) tablet Take 1,000 Units by mouth daily.     Cyanocobalamin (B-12) 2500 MCG TABS Take 1,250 mcg by mouth daily.     EPINEPHrine 0.3 mg/0.3 mL IJ SOAJ injection Inject 0.3 mg  into the muscle as needed for anaphylaxis. for allergic reaction     ezetimibe (ZETIA) 10 MG tablet TAKE 1 TABLET BY MOUTH EVERY DAY 90 tablet 2   fluticasone (FLONASE) 50 MCG/ACT nasal spray Place 1 spray into both nostrils daily.     Fluticasone Furoate (ARNUITY ELLIPTA) 100 MCG/ACT AEPB Inhale 1 Inhalation into the lungs daily.     folic acid (FOLVITE) 800 MCG tablet Take 800 mcg by mouth daily.      gabapentin (NEURONTIN) 300 MG capsule Take 2 cap in AM, 1 cap at noon, 2caps at bedtime 450 capsule 3   Glucos-Chond-Hyal Ac-Ca Fructo (MOVE FREE JOINT HEALTH ADVANCE PO) Take 1 tablet by mouth daily.     hydrochlorothiazide (HYDRODIURIL) 25 MG tablet TAKE 1 TABLET BY MOUTH EVERY DAY 90 tablet 2   Lidocaine HCl (ASPERCREME LIDOCAINE) 4 % CREA Apply topically in the morning and at bedtime.     mirabegron ER (MYRBETRIQ) 50 MG TB24 tablet Take 1 tablet (50 mg total) by mouth daily. 90 tablet 1   nitroGLYCERIN (NITROSTAT) 0.4 MG SL tablet Place 1 tablet (0.4 mg total) under the tongue every 5 (five) minutes as needed for chest pain. 30 tablet 5   nystatin ointment (MYCOSTATIN) Apply 1 Application topically 2 (two) times daily. 30 g 3   pantoprazole (PROTONIX) 40 MG tablet TAKE 1 TABLET BY MOUTH EVERY DAY 90 tablet 3   polyethylene glycol powder (GLYCOLAX/MIRALAX) powder Take 17 g by mouth daily. (Patient taking differently: Take 17 g by mouth in the morning and at bedtime.) 255 g 0   Probiotic Product (PROBIOTIC PO) Take by mouth in the morning.     valsartan (DIOVAN) 320 MG tablet TAKE 1 TABLET BY MOUTH EVERY DAY 90 tablet 3   Wheat Dextrin (BENEFIBER) POWD Take 1 Dose by mouth daily. 1 dose = 2 teaspoons     diazepam (VALIUM) 5 MG tablet TAKE 1 TABLET (5 MG TOTAL) BY MOUTH AT BEDTIME AS NEEDED (SLEEP). (Patient not taking: Reported on 02/11/2023) 30 tablet 0   No current facility-administered medications for this visit.     Objective:  BP 112/60   Pulse (!) 47   Temp 97.6 F (36.4 C)   Ht 5'  6" (1.676 m)   Wt 205 lb 9.6 oz (93.3 kg)   SpO2 95%   BMI 33.18 kg/m  Gen: NAD, resting comfortably GU: Bilateral inguinal folds mildly moist-faint erythema more on the left.  Faint erythema over scrotum on the left side.  Patient reports pain over the scrotum only but if I palpate the scrotum skin and remove the testicle pain seems to resolve.  Uncircumcised-visible portion of penis unremarkable-appears to have applied some cream today    Assessment and Plan    #  Scrotum and testicular pain in patient with incontinence/overactive bladder- in patient with history of prostate/bladder cancer fully treated S: Incontinence issues issues date back to prostatectomy and bladder cancer cautery in 2012.  Myrbetriq is somewhat helpful  Testicular/scrotal pain started around June 2020- stepped off a curb and had pain. We did testicular ultrasound 09/12/2018 which shoed no obvious cause though did have bilateral varicocele and small bilaterally hydroceles with no mass or torsion. He had reported hydrocortisone helping some at that time. He ended up seeing Dr. Alma Friendly and diagnosed with a hernia. Next saw Dr. Daphine Deutscher and after a month of wearing a jock strap (with some improvement) ended up with hernia repair which in and of itself was very painful. Pain resolved eventually after surgery 01/02/2019. Felt better for sometime/several years until spring of this year  Started to have pain 07/06/22 and saw Dr. 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   Seen back 07/13/22 - was thought to be worse on follow up and some concern for reactive arthritis so  (but no sex in years so gonorrhea and chlamydia) -later thought without conjunctivitis or gastroenterology symptoms unlikely to be reactive  arthritis- instead thought more likely fungal and given clotrimazole. Then seen back on 07/29/22 and had improved but still symptomatic with pain. He was given another round of cephalexin for potential bacterial element/cellulitis and started on nystatin and seen a week later- thought that the bacterial infection had resolved but with ongoing tenderness concern for persistent yeast infection- was advised nystatin powder again as thought ongoing yeast infection. Then seen 08/19/22 and there was still concern for fungal infection so given fluconazole every 3 daysfor 10 days  He as also referred to urology due to incontinence contributing. During this time did have worsening pain and had Emergency Department vsiit and 11/19/22 ultrasound was very similar "1. Small right hydrocele and varicocele, which is less common than a left-sided varicocele. Consider further evaluation with nonemergent contrast-enhanced CT of the abdomen and pelvis. 2. Right epididymal head cyst measures 6 mm. 3.  No sonographic finding of testicular torsion."  -Of note once again already had varicole on last   He reports that he Saw urology who stated nothing they could do and referred to dermatology- saw Dr. Yetta Barre who stated that large testicle and scrotum and that hitting his pad with urine in it can cause friction/discomfort though no visible pain. Was told to see neurology (who directed him back to urologist/multidisciplinary team as they do not address this type of issue). He saw Dr. Lafonda Mosses on 01/01/23. With incontinence consideration for cystoscopy  of incontinence issues. They noted scrotal pain cutaneous- he was essentially told for this to see pain management specialist and was told "nothing I had to offer" per the note  [facility] and given treatment for yeast infection that made him feel ill so he stopped taking it- not sure what name was.   He reports pain level up to 7 out of 10.  He reports the only  thing that seems to help is lidocaine using lidocaine coating per day and this helps her most of the day. Doesn't wear pad or underwear at night to avoid yeast infection. Sarna helped for sometime but no longer helping. Pain is skin of scrotum -still on  gabpentin 600mg  in am, 1 at noon, 2 at bedtime- suspect this helps. Doesn't feel well with higher dose- too sleepy.  This is for neuropathy through Dr. Karel Jarvis -still uses nystatin at times - tramadol not needing  He also reached out to [facility] Dr. Wynn Banker he reports and was told he does not treat scrotal pain and he has not had any success getting set up with pain management  A/P: Patient with scrotum pain dating back to April so well over 6 months.  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  Initially reports that the pain is only on the scrotum itself on exam his tenderness includes the testicle as well and up to the base of the penis.  I do not think this is likely but I am going to reach out to Dr. Lafonda Mosses for his opinion on pudendal neuralgia and possible pudendal nerve block.  He has seen urology, dermatology, consulted neurology, consulted pain management and reports no significant relief of his symptoms other than with lidocaine - Encouraged him to continue lidocaine but increase to twice daily since he seems to have such benefit -Looking back in last testicular ultrasound I recommend a CT abdomen pelvis to further evaluate varicocele-with chronicity of pain I opted for MRI of pelvis - I also discussed looking at alternative treatments such as even hypnotherapy which she declined-reports tried this for quitting smoking years ago and was not effective -He has follow-up with me in early December  # Gout-of note stopped allopurinol due to stomach upset  # Osteoarthritis of the knee-genicular artery embolization being considered with Dr. Elby Showers on [facility] per patient report-for further by Dr.  Dessa Phi to look into further  #CAD/hyperlipidemia-history of CABG x5- follows with Dr. Antoine Poche S: Meds: asa 81mg , praluent -Fatigue on Repatha in 2021-restarted on Zetia 10 mg p.o. as well but later placed back on praluent 150 mg q2 weeks -Stent drug eluting placed 04/13/19 Dr. Clifton James -No chest pain or shortness of breath reported Lab Results  Component Value Date   CHOL 115 06/29/2022   HDL 42 06/29/2022   LDLCALC 46 06/29/2022   LDLDIRECT 115.0 06/04/2016   TRIG 159 (H) 06/29/2022   CHOLHDL 2.7 06/29/2022   A/P: CAD appears largely asymptomatic-continue current medication Cholesterols been very well-controlled-continue current medication   #Hypertension/CKD stage III S: Compliant with amlodipine 5 mg, hydrochlorothiazide 25 mg and valsartan 320mg  -GFR has been stable.  On ARB in case proteinuric element BP Readings from Last 3 Encounters:  02/11/23 112/60  02/05/23 128/72  01/05/23 122/66   A/P: Blood pressure well-controlled-continue current medication.  Most recent GFR in high 40s-CKD stage III appears stable-continue to monitor  Recommended follow up: Return for next already scheduled visit or sooner if needed. Future Appointments  Date Time Provider Department Center  02/24/2023 10:20 AM Shelva Majestic, MD [facility]  02/26/2023  1:45 PM Kirsteins, Victorino Sparrow, MD [facility] CPR  03/10/2023 10:30 AM Judi Saa, DO [facility] None  04/08/2023 10:00 AM Henrene Pastor, R[facility] None  04/28/2023  8:30 AM Van Clines, MD [facility] None  06/10/2023 10:00 AM [facility] ANNUAL WELLNESS VISIT 1 [facility]    Lab/Order associations:   ICD-10-CM   1. Pain in scrotum  N50.82 MR Pelvis W Wo Contrast    2. Varicocele  I86.1 MR Pelvis W Wo Contrast    3. Scrotal pain  N50.82     4. Atherosclerosis of native coronary artery  of native heart without angina pectoris  I25.10     5. Essential hypertension  I10     6. Hyperlipidemia, unspecified hyperlipidemia type   E78.5       Time Spent: 60 minutes of total time (2:41 PM-3:41 PM) was spent on the date of the encounter performing the following actions: chart review prior to seeing the patient, obtaining history, performing a medically necessary exam, counseling on the treatment plan and further workup options, attempting to coordinate care by reaching out to Dr. Lafonda Mosses, placing orders, and documenting in our EHR.    Return precautions advised.  Tana Conch, MD

## 2023-02-11 NOTE — Patient Instructions (Addendum)
We have placed a referral for you today to MRI pelvis. In some cases you will see # listed below- you can call this if you have not heard within a week. If you do not see # listed- you should receive a mychart message or phone call within a week with the # to call directly- call that as soon as you get it. If you are having issues getting scheduled reach out to [place] again.   Can increase lidocaine to twice a day  Recommended follow up: Return for next already scheduled visit or sooner if needed.

## 2023-02-11 NOTE — Assessment & Plan Note (Signed)
# Scrotum and testicular pain in patient with incontinence/overactive bladder- in patient with history of prostate/bladder cancer fully treated S: Incontinence issues issues date back to prostatectomy and bladder cancer cautery in 2012.  Myrbetriq is somewhat helpful  Testicular/scrotal pain started around June 2020- stepped off a curb and had pain. We did testicular ultrasound 09/12/2018 which shoed no obvious cause though did have bilateral varicocele and small bilaterally hydroceles with no mass or torsion. He had reported hydrocortisone helping some at that time. He ended up seeing Dr. Alma Friendly and diagnosed with a hernia. Next saw Dr. Daphine Deutscher and after a month of wearing a jock strap (with some improvement) ended up with hernia repair which in and of itself was very painful. Pain resolved eventually after surgery 01/02/2019. Felt better for sometime/several years until spring of this year  Started to have pain 07/06/22 and saw Dr. 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   Seen back 07/13/22 - was thought to be worse on follow up and some concern for reactive arthritis so  (but no sex in years so gonorrhea and chlamydia) -later thought without conjunctivitis or gastroenterology symptoms unlikely to be reactive arthritis- instead thought more likely fungal and given clotrimazole. Then seen back on 07/29/22 and had improved but still symptomatic with pain. He was given another round of cephalexin for potential bacterial element/cellulitis and started on nystatin and seen a week later- thought that the bacterial infection had resolved but with ongoing tenderness concern for persistent yeast infection- was advised nystatin powder again as thought ongoing yeast infection. Then seen 08/19/22 and  there was still concern for fungal infection so given fluconazole every 3 daysfor 10 days  He as also referred to urology due to incontinence contributing. During this time did have worsening pain and had Emergency Department vsiit and 11/19/22 ultrasound was very similar "1. Small right hydrocele and varicocele, which is less common than a left-sided varicocele. Consider further evaluation with nonemergent contrast-enhanced CT of the abdomen and pelvis. 2. Right epididymal head cyst measures 6 mm. 3.  No sonographic finding of testicular torsion."  -Of note once again already had varicole on last   He reports that he Saw urology who stated nothing they could do and referred to dermatology- saw Dr. Yetta Barre who stated that large testicle and scrotum and that hitting his pad with urine in it can cause friction/discomfort though no visible pain. Was told to see neurology (who directed him back to urologist/multidisciplinary team as they do not address this type of issue). He saw Dr. Lafonda Mosses on 01/01/23. With incontinence consideration for cystoscopy  of incontinence issues. They noted scrotal pain cutaneous- he was essentially told for this to see pain management specialist and was told "nothing I had to offer" per the note  [facility] and given treatment for yeast infection that made him feel ill so he stopped taking it- not sure what name was.   He reports pain level up to 7 out of 10.  He reports the only thing that seems to help is lidocaine using lidocaine coating per day and this helps her most of the day. Doesn't wear pad or underwear at night to avoid yeast infection. Sarna helped for sometime but no longer helping. Pain is skin of scrotum -still on  gabpentin 600mg  in am, 1 at noon, 2 at bedtime- suspect this helps. Doesn't feel well with higher dose- too sleepy.  This is for neuropathy through Dr. Karel Jarvis -still uses nystatin at times - tramadol not needing  He also reached  out to [facility] Dr. Wynn Banker he reports and was told he does not treat scrotal pain and he has not had any success getting set up with pain management  A/P: Patient with scrotum pain dating back to April so well over 6 months.  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  Initially reports that the pain is only on the scrotum itself on exam his tenderness includes the testicle as well and up to the base of the penis.  I do not think this is likely but I am going to reach out to Dr. Lafonda Mosses for his opinion on pudendal neuralgia and possible pudendal nerve block.  He has seen urology, dermatology, consulted neurology, consulted pain management and reports no significant relief of his symptoms other than with lidocaine - Encouraged him to continue lidocaine but increase to twice daily since he seems to have such benefit -Looking back in last testicular ultrasound I recommend a CT abdomen pelvis to further evaluate varicocele-with chronicity of pain I opted for MRI of pelvis - I also discussed looking at alternative treatments such as even hypnotherapy which she declined-reports tried this for quitting smoking years ago and was not effective -He has follow-up with me in early December

## 2023-02-15 ENCOUNTER — Telehealth: Payer: Self-pay | Admitting: Internal Medicine

## 2023-02-15 NOTE — Telephone Encounter (Signed)
PT is calling to discuss the severe diarrhea, pain and strange noises he has been experiencing since getting colonoscopy 04/2021. Please advise.

## 2023-02-15 NOTE — Telephone Encounter (Signed)
Left message for pt to call back  °

## 2023-02-15 NOTE — Telephone Encounter (Signed)
Pt states he has had a "sick stomach" for the past couple of years. Reports at night his stomach makes loud noises. Also states he has abdominal pain, cramping and some diarrhea. Requesting to be seen. Pt scheduled to see [facility] tomorrow at 1:30pm. Pt aware of appt.

## 2023-02-16 ENCOUNTER — Ambulatory Visit: Payer: PPO | Admitting: Gastroenterology

## 2023-02-16 ENCOUNTER — Other Ambulatory Visit: Payer: Self-pay | Admitting: Interventional Radiology

## 2023-02-16 ENCOUNTER — Encounter: Payer: Self-pay | Admitting: Gastroenterology

## 2023-02-16 VITALS — BP 98/58 | HR 49 | Ht 66.0 in | Wt 207.1 lb

## 2023-02-16 DIAGNOSIS — R140 Abdominal distension (gaseous): Secondary | ICD-10-CM

## 2023-02-16 DIAGNOSIS — K582 Mixed irritable bowel syndrome: Secondary | ICD-10-CM

## 2023-02-16 DIAGNOSIS — R634 Abnormal weight loss: Secondary | ICD-10-CM | POA: Diagnosis not present

## 2023-02-16 DIAGNOSIS — M1712 Unilateral primary osteoarthritis, left knee: Secondary | ICD-10-CM

## 2023-02-16 DIAGNOSIS — K581 Irritable bowel syndrome with constipation: Secondary | ICD-10-CM

## 2023-02-16 DIAGNOSIS — R198 Other specified symptoms and signs involving the digestive system and abdomen: Secondary | ICD-10-CM

## 2023-02-16 MED ORDER — LINACLOTIDE 145 MCG PO CAPS: 145.0000 ug | ORAL_CAPSULE | Freq: Every day | ORAL | 1 refills | Status: DC

## 2023-02-16 NOTE — Patient Instructions (Signed)
_______________________________________________________  If your blood pressure at your visit was 140/90 or greater, please contact your primary care physician to follow up on this.  _______________________________________________________  If you are age 80 or older, your body mass index should be between 23-30. Your Body mass index is 33.43 kg/m. If this is out of the aforementioned range listed, please consider follow up with your Primary Care Provider.  If you are age 27 or younger, your body mass index should be between 19-25. Your Body mass index is 33.43 kg/m. If this is out of the aformentioned range listed, please consider follow up with your Primary Care Provider.   ________________________________________________________  The [facility] providers would like to encourage you to use [facility] to communicate with providers for non-urgent requests or questions.  Due to long hold times on the telephone, sending your provider a message by [facility] may be a faster and more efficient way to get a response.  Please allow 48 business hours for a response.  Please remember that this is for non-urgent requests.  _______________________________________________________  We have sent the following medications to your pharmacy for you to pick up at your convenience: Linzess  I appreciate the opportunity to care for you. Boone Master, PA-C

## 2023-02-16 NOTE — Progress Notes (Signed)
Chief Complaint: IBS-C Primary GI MD: Dr. Rhea Belton  HPI: 82 year old male history of IBS and others as listed below presents for evaluation of bloating and IBS-C.  Last seen in office November 2022 by Doug Sou.  At that time he was having generalized abdominal pain/discomfort with bloating, extensive borborygmi.  Had a negative CT scan.  He then proceeded with colonoscopy February 2023 with 2 small tubular adenomas, 1 hyperplastic polyp, 1 polypoid colonic mucosal polyp.  He also completed a hydrogen breath test 01/2021 which was negative  He has failed Gas-X and several different OTC remedies. IB gard with no results.   Recent CBC/CMP unrevealing  Discussed the use of AI scribe software for clinical note transcription with the patient, who gave verbal consent to proceed.  The patient, with a known history of Irritable Bowel Syndrome (IBS), presents with ongoing abdominal discomfort, bloating, and alternating diarrhea and constipation (more often constipation). He describes the abdominal pain as similar to his previous IBS cramping but less severe. The discomfort is most prominent in the upper abdomen, above the belly button, and is somewhat relieved post-meal and post-bowel movement. The patient also reports excessive gas/bowel sounds, which is audible at night and can be felt moving within the abdomen.  The patient's bowel habits vary, with up to three loose stools in a day or no bowel movement for up to two days, despite being on MiraLAX twice daily. He also takes Benefiber, a gas pill in the morning and sometimes at night, and a probiotic from [employer]. However, these interventions have not completely alleviated the symptoms though he thinks they help.  The patient has lost approximately three to four pounds, which he attributes to a decreased appetite due to feeling unwell. He denies any associated nausea. The patient has a history of multiple food allergies, which limit his  dietary options.  The patient's symptoms have been investigated extensively in the past, including a colonoscopy in 2023 and a hydrogen breath test, which was negative. He also recalls a medication called Cambia, taken approximately 45 years ago, which significantly improved his IBS symptoms before it was discontinued.      PREVIOUS GI WORKUP   05/07/2021 Colonoscopy for abdominal pain and bloating - One 4 mm polyp in the cecum, removed with a cold snare. Resected and retrieved.  - One 5 mm polyp in the descending colon, removed with a cold snare. Resected and retrieved.  - One 4 mm polyp in the proximal rectum, removed with a cold snare. Resected and retrieved.  - One 9 mm polyp in the mid rectum, removed with a cold snare. Resected and retrieved. - Diverticulosis in the sigmoid colon, in the descending colon and in the ascending colon. - Internal hemorrhoids. - no repeat recommended  Diagnosis 1. Surgical [P], colon, descending and cecum, polyp (2) - TUBULAR ADENOMA, ONE FRAGMENT. NO HIGH GRADE DYSPLASIA OR MALIGNANCY. - POLYPOID COLONIC MUCOSA, ONE FRAGMENT. NO DYSPLASIA OR MALIGNANCY. 2. Surgical [P], colon, rectum, polyp (2) - TUBULAR ADENOMA, TWO FRAGMENTS. NO HIGH GRADE DYSPLASIA OR MALIGNANCY. - HYPERPLASTIC POLYP, TWO FRAGMENTS. NO DYSPLASIA OR MALIGNANCY.  Upper endoscopy in September 2010 showing a hiatal hernia and otherwise normal exam and also had colonoscopy at that same time showing moderate diverticulosis throughout the colon, no polyps and was to have a 10 year interval follow-up.   Past Medical History:  Diagnosis Date   Acute medial meniscal tear    Anemia 2015   Arthritis    "middle finger right  hand; right knee; neck" (02/06/2014)   Asthma    seasonal   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"   CAD (coronary artery disease)    CABG 2001   Diverticulosis    DJD (degenerative joint disease)    BACK   GERD (gastroesophageal reflux  disease)    H/O inguinal hernia repair 12/2018   History of gout    Hyperlipidemia    Hypertension    IBS (irritable bowel syndrome)    Obesity    OSA (obstructive sleep apnea)    USES CPAP    Pancreatitis ~ 1980   Peripheral vascular disease (HCC)    Pre-diabetes    Prostate cancer (HCC) 04/2010    Past Surgical History:  Procedure Laterality Date   ANTERIOR CERVICAL DECOMP/DISCECTOMY FUSION  05/26/2006   PLATE PLACED   BACK SURGERY     CARDIAC CATHETERIZATION  11/20/1999   CORONARY ARTERY BYPASS GRAFT  11/20/1999   SVG-RI1-RI2, SVG-OM, SVG-dRCA   CORONARY STENT INTERVENTION N/A 04/13/2019   Procedure: CORONARY STENT INTERVENTION;  Surgeon: Kathleene Hazel, MD;  Location: [facility];  Service: Cardiovascular;  Laterality: N/A;   HERNIA REPAIR     INGUINAL HERNIA REPAIR Left 01/02/2019   Procedure: OPEN LEFT INGUINAL HERNIA REPAIR WITH MESH;  Surgeon: Luretha Murphy, MD;  Location: [facility];  Service: General;  Laterality: Left;   IR RADIOLOGIST EVAL & MGMT  02/05/2023   KNEE ARTHROSCOPY Right 1992; 04/2008   LAPAROSCOPIC CHOLECYSTECTOMY  2007   LEFT HEART CATH AND CORS/GRAFTS ANGIOGRAPHY N/A 04/13/2019   Procedure: LEFT HEART CATH AND CORS/GRAFTS ANGIOGRAPHY;  Surgeon: Kathleene Hazel, MD;  Location: [facility];  Service: Cardiovascular;  Laterality: N/A;   NASAL SEPTUM SURGERY Right 2007   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   ROBOT ASSISTED LAPAROSCOPIC RADICAL PROSTATECTOMY  04/2010   THYROIDECTOMY, PARTIAL  1980   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   Procedure: LEFT TOTAL KNEE ARTHROPLASTY;  Surgeon: Marcene Corning, MD;  Location: [facility];  Service: Orthopedics;  Laterality: Left;    Current Outpatient Medications  Medication Sig Dispense Refill   acetaminophen (TYLENOL) 500 MG tablet Take 1,000 mg by mouth every 6 (six) hours as needed for moderate pain or headache.     albuterol (PROAIR HFA) 108 (90 Base) MCG/ACT inhaler Inhale 1-2  puffs into the lungs every 6 (six) hours as needed for wheezing or shortness of breath. 18 g 5   Alirocumab (PRALUENT) 150 MG/ML SOAJ INJECT 1 DOSE INTO THE SKIN EVERY 14 (FOURTEEN) DAYS. 6 mL 3   amLODipine (NORVASC) 5 MG tablet TAKE 1 TABLET (5 MG TOTAL) BY MOUTH DAILY. 90 tablet 3   aspirin EC 81 MG tablet Take 1 tablet (81 mg total) by mouth 2 (two) times daily after a meal. 30 tablet 11   cholecalciferol (VITAMIN D3) 25 MCG (1000 UNIT) tablet Take 1,000 Units by mouth daily.     Cyanocobalamin (B-12) 2500 MCG TABS Take 1,250 mcg by mouth daily.     diazepam (VALIUM) 5 MG tablet TAKE 1 TABLET (5 MG TOTAL) BY MOUTH AT BEDTIME AS NEEDED (SLEEP). 30 tablet 0   EPINEPHrine 0.3 mg/0.3 mL IJ SOAJ injection Inject 0.3 mg into the muscle as needed for anaphylaxis. for allergic reaction     ezetimibe (ZETIA) 10 MG tablet TAKE 1 TABLET BY MOUTH EVERY DAY 90 tablet 2   fluticasone (FLONASE) 50 MCG/ACT nasal spray Place 1 spray into both nostrils daily.  Fluticasone Furoate (ARNUITY ELLIPTA) 100 MCG/ACT AEPB Inhale 1 Inhalation into the lungs daily.     folic acid (FOLVITE) 800 MCG tablet Take 800 mcg by mouth daily.      gabapentin (NEURONTIN) 300 MG capsule Take 2 cap in AM, 1 cap at noon, 2caps at bedtime 450 capsule 3   Glucos-Chond-Hyal Ac-Ca Fructo (MOVE FREE JOINT HEALTH ADVANCE PO) Take 1 tablet by mouth daily.     hydrochlorothiazide (HYDRODIURIL) 25 MG tablet TAKE 1 TABLET BY MOUTH EVERY DAY 90 tablet 2   ketoconazole (NIZORAL) 2 % cream Apply 1 Application topically 2 (two) times daily as needed.     Lidocaine HCl (ASPERCREME LIDOCAINE) 4 % CREA Apply topically in the morning and at bedtime.     linaclotide (LINZESS) 145 MCG CAPS capsule Take 1 capsule (145 mcg total) by mouth daily before breakfast. 30 capsule 1   mirabegron ER (MYRBETRIQ) 50 MG TB24 tablet Take 1 tablet (50 mg total) by mouth daily. 90 tablet 1   nitroGLYCERIN (NITROSTAT) 0.4 MG SL tablet Place 1 tablet (0.4 mg total)  under the tongue every 5 (five) minutes as needed for chest pain. 30 tablet 5   nystatin ointment (MYCOSTATIN) Apply 1 Application topically 2 (two) times daily. 30 g 3   pantoprazole (PROTONIX) 40 MG tablet TAKE 1 TABLET BY MOUTH EVERY DAY 90 tablet 3   polyethylene glycol powder (GLYCOLAX/MIRALAX) powder Take 17 g by mouth daily. (Patient taking differently: Take 17 g by mouth in the morning and at bedtime.) 255 g 0   Probiotic Product (PROBIOTIC PO) Take by mouth in the morning.     valsartan (DIOVAN) 320 MG tablet TAKE 1 TABLET BY MOUTH EVERY DAY 90 tablet 3   Wheat Dextrin (BENEFIBER) POWD Take 1 Dose by mouth daily. 1 dose = 2 teaspoons     No current facility-administered medications for this visit.    Allergies as of 02/16/2023 - Review Complete 02/16/2023  Allergen Reaction Noted   Codeine Other (See Comments)    Lexapro [escitalopram]  08/21/2020   Bromfed Nausea And Vomiting 12/14/2006   Clarithromycin Other (See Comments) 10/12/2006   Cymbalta [duloxetine hcl]  01/01/2022   Doxycycline hyclate Nausea And Vomiting 12/14/2006   Modafinil Other (See Comments) 12/14/2006   Oxycodone-acetaminophen Itching 11/25/2016   Promethazine hcl Other (See Comments) 12/14/2006   Quinolones Nausea Only 09/20/2014   Telithromycin Nausea And Vomiting 12/14/2006   Tramadol Nausea Only 07/29/2020   Benadryl [diphenhydramine] Other (See Comments) 02/06/2014   Hydrocodone-acetaminophen Rash 12/14/2006   Repatha [evolocumab]  02/16/2022   Statins Other (See Comments) 04/14/2019   Sulfonamide derivatives Rash     Family History  Problem Relation Age of Onset   Heart disease Mother    Hyperlipidemia Mother    Heart disease Father    Hyperlipidemia Father    Diabetes Brother    Colon cancer Neg Hx    Pancreatic cancer Neg Hx    Esophageal cancer Neg Hx    Stomach cancer Neg Hx    Liver cancer Neg Hx     Social History   Socioeconomic History   Marital status: Married    Spouse  name: Not on file   Number of children: Not on file   Years of education: Not on file   Highest education level: Associate degree: academic program  Occupational History   Occupation: retired    Associate Professor: RETIRED  Tobacco Use   Smoking status: Former    Current packs/day: 0.00  Average packs/day: 1 pack/day for 35.0 years (35.0 ttl pk-yrs)    Types: Cigarettes    Start date: 06/16/1957    Quit date: 06/16/1992    Years since quitting: 30.6   Smokeless tobacco: Never  Vaping Use   Vaping status: Never Used  Substance and Sexual Activity   Alcohol use: Yes    Alcohol/week: 2.0 standard drinks of alcohol    Types: 2 Shots of liquor per week    Comment: occ   Drug use: No   Sexual activity: Not Currently    Partners: Female  Other Topics Concern   Not on file  Social History Narrative   Married 42 years in 2015. 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      Hobbies: poker, movies and tv, staying active      Right handed   2 level home with steps he uses   Social Determinants of Corporate investment banker Strain: Low Risk  (02/09/2023)   Overall Financial Resource Strain (CARDIA)    Difficulty of Paying Living Expenses: Not hard at all  Food Insecurity: No Food Insecurity (02/09/2023)   Hunger Vital Sign    Worried About Running Out of Food in the Last Year: Never true    Ran Out of Food in the Last Year: Never true  Transportation Needs: No Transportation Needs (02/09/2023)   PRAPARE - Administrator, Civil Service (Medical): No    Lack of Transportation (Non-Medical): No  Physical Activity: Inactive (02/09/2023)   Exercise Vital Sign    Days of Exercise per Week: 0 days    Minutes of Exercise per Session: 10 min  Stress: Stress Concern Present (02/09/2023)   [employer] of Occupational Health - Occupational Stress Questionnaire    Feeling of Stress : To some extent  Social  Connections: Moderately Integrated (02/09/2023)   Social Connection and Isolation Panel [NHANES]    Frequency of Communication with Friends and Family: More than three times a week    Frequency of Social Gatherings with Friends and Family: Once a week    Attends Religious Services: Never    Database administrator or Organizations: Yes    Attends Engineer, structural: More than 4 times per year    Marital Status: Married  Catering manager Violence: Not At Risk (06/04/2022)   Humiliation, Afraid, Rape, and Kick questionnaire    Fear of Current or Ex-Partner: No    Emotionally Abused: No    Physically Abused: No    Sexually Abused: No    Review of Systems:    Constitutional: No weight loss, fever, chills, weakness or fatigue HEENT: Eyes: No change in vision               Ears, Nose, Throat:  No change in hearing or congestion Skin: No rash or itching Cardiovascular: No chest pain, chest pressure or palpitations   Respiratory: No SOB or cough Gastrointestinal: See HPI and otherwise negative Genitourinary: No dysuria or change in urinary frequency Neurological: No headache, dizziness or syncope Musculoskeletal: No new muscle or joint pain Hematologic: No bleeding or bruising Psychiatric: No history of depression or anxiety    Physical Exam:  Vital signs: BP (!) 98/58   Pulse (!) 49   Ht 5\' 6"  (1.676 m)   Wt 207 lb 2 oz (94 kg)   SpO2 98%  BMI 33.43 kg/m   Constitutional: NAD, Well developed, Well nourished, alert and cooperative Head:  Normocephalic and atraumatic. Eyes:   PEERL, EOMI. No icterus. Conjunctiva pink. Respiratory: Respirations even and unlabored. Lungs clear to auscultation bilaterally.   No wheezes, crackles, or rhonchi.  Cardiovascular:  Regular rate and rhythm. No peripheral edema, cyanosis or pallor.  Gastrointestinal:  diffuse abdominal tenderness, moreso in upper abdomen. abdomen with mild distention. Normal bowel sounds. Rectal:  Not performed.   Msk:  Symmetrical without gross deformities. Without edema, no deformity or joint abnormality.  Neurologic:  Alert and  oriented x4;  grossly normal neurologically.  Skin:   Dry and intact without significant lesions or rashes. Psychiatric: Oriented to person, place and time. Demonstrates good judgement and reason without abnormal affect or behaviors.    RELEVANT LABS AND IMAGING: CBC    Component Value Date/Time   WBC 7.5 12/22/2022 1451   RBC 4.71 12/22/2022 1451   HGB 14.5 12/22/2022 1451   HGB 14.8 12/23/2021 1552   HCT 43.2 12/22/2022 1451   HCT 42.8 12/23/2021 1552   PLT 260.0 12/22/2022 1451   PLT 274 12/23/2021 1552   MCV 91.7 12/22/2022 1451   MCV 89 12/23/2021 1552   MCH 32.2 11/19/2022 0955   MCHC 33.7 12/22/2022 1451   RDW 13.1 12/22/2022 1451   RDW 11.9 12/23/2021 1552   LYMPHSABS 1.6 12/22/2022 1451   MONOABS 0.8 12/22/2022 1451   EOSABS 0.3 12/22/2022 1451   BASOSABS 0.1 12/22/2022 1451    CMP     Component Value Date/Time   NA 137 12/22/2022 1451   NA 140 12/23/2021 1552   K 4.2 12/22/2022 1451   CL 100 12/22/2022 1451   CO2 32 12/22/2022 1451   GLUCOSE 110 (H) 12/22/2022 1451   BUN 20 12/22/2022 1451   BUN 21 12/23/2021 1552   CREATININE 1.35 12/22/2022 1451   CREATININE 1.33 (H) 02/23/2020 0933   CALCIUM 9.7 12/22/2022 1451   PROT 6.7 12/22/2022 1451   PROT 6.7 04/16/2020 0845   ALBUMIN 4.0 12/22/2022 1451   ALBUMIN 4.3 04/16/2020 0845   AST 17 12/22/2022 1451   ALT 18 12/22/2022 1451   ALKPHOS 60 12/22/2022 1451   BILITOT 0.7 12/22/2022 1451   BILITOT 0.8 04/16/2020 0845   GFRNONAA >60 11/19/2022 1104   GFRNONAA 51 (L) 02/23/2020 0933   GFRAA 59 (L) 02/23/2020 0933     Assessment/Plan:      Irritable Bowel Syndrome (IBS) Persistent symptoms of bloating, gas, abdominal pain, and alternating constipation and loose stools despite current regimen of MiraLAX, Benefiber, and probiotics. Suspected that MiraLAX and fiber may be exacerbating  bloating and gas symptoms. Extensive negative workup including negative hydrogen breath test, CT scan, colonoscopy. --Discontinue MiraLAX and Benefiber. --Start Linzess --Contact patient after 7 days of Linzess treatment to assess response. --Provide patient with low FODMAP diet information for consideration, acknowledging patient's known food allergies. -- consider dicyclomine if no results with the above.  Weight Loss Unintentional weight loss of 3-4 pounds, possibly related to decreased appetite due to IBS symptoms. -Monitor weight closely, reassess if further weight loss occurs.  Follow-up Plan to hear from patient after 7 days of Linzess treatment. If Linzess is too expensive, explore patient assistance programs. Follow up in 3 months.     Lara Mulch [facility] 02/16/2023, 2:28 PM  Cc: Shelva Majestic, MD

## 2023-02-22 ENCOUNTER — Telehealth: Payer: Self-pay

## 2023-02-22 NOTE — Telephone Encounter (Signed)
Patient left message and would like to talk to someone regarding his Praluent.  Please call.

## 2023-02-23 NOTE — Telephone Encounter (Signed)
Spoke with patient, he was wondering when his [employer] expires.  Current grant good until 12/22/23

## 2023-02-24 ENCOUNTER — Ambulatory Visit: Payer: PPO | Admitting: Family Medicine

## 2023-02-24 NOTE — Progress Notes (Signed)
Addendum: Reviewed and agree with assessment and management plan. Kadijah Shamoon M, MD  

## 2023-02-26 ENCOUNTER — Encounter: Payer: PPO | Admitting: Physical Medicine & Rehabilitation

## 2023-03-01 ENCOUNTER — Telehealth: Payer: Self-pay | Admitting: Gastroenterology

## 2023-03-01 DIAGNOSIS — G4733 Obstructive sleep apnea (adult) (pediatric): Secondary | ICD-10-CM | POA: Diagnosis not present

## 2023-03-01 NOTE — Telephone Encounter (Signed)
I have asked patient to purchase a fleet enema over the counter and use this today to see if we are able to get him to have a bowel movement and relieve some pressure in the rectum. Patient states he will do this. I have also placed 8 more capsules of Linzess 145 mcg at the front desk so he can try those a bit longer before making a decision as to whether he will continue linzess or change to another medication. Linzess prescription is $73 dollars per month for patient.

## 2023-03-01 NOTE — Telephone Encounter (Signed)
Left message for patient to call back. How long has been taking the Linzess 145 mcg at this point? Taking it every day? Has had had any bowel movements since being on it?

## 2023-03-01 NOTE — Telephone Encounter (Signed)
Patient calls stating that he has been taking the linzess 145 mcg daily x 7 days (today is day 8). He has had some episodes of explosive diarrhea and cramping, however, he has had no bowel movements since Saturday. He continues with intense abdominal cramping and states that he also feels pressure in the rectum like he needs to have a bowel movement but is still unable to. We did discuss diarrhea, bloating/discomfort that can occur for the first 7-14 days following initiation of Linzess, but patient again describes being very uncomfortable. He notes that he has discontinued the Miralax and benefiber he was previously taken. States he did take a stool softener yesterday and today, again with no result.

## 2023-03-01 NOTE — Telephone Encounter (Signed)
PT wanted to let Bayley know that the Linzess is not working. He has had stomach cramps going on for 9 hours now and has had no BM. Please advise.

## 2023-03-05 ENCOUNTER — Telehealth: Payer: Self-pay

## 2023-03-05 MED ORDER — METHYLPREDNISOLONE 4 MG PO TBPK: ORAL_TABLET | ORAL | 0 refills | Status: DC

## 2023-03-05 NOTE — Progress Notes (Signed)
Chief Complaint: Patient was seen in consultation today for left knee pain.   Referring Physician(s): Rodolph Bong.   History of Present Illness: Joshua [name] is a 82 y.o. male with a medical history significant for CAD (s/p CABG), gout, HTN, bladder cancer, prostate cancer, obesity, PVD, low back pain and arthritis. He is s/p left total knee arthroplasty in 2022 but continues to have left knee pain as well as pain in the right knee. He was evaluated by Dr. Denyse Amass in October for right hip pain and right knee pain. He has had prior right knee injections with PRP and Monovisc which were ineffective. He recently had a geniculate block to the left knee and he was referred to IR for geniculate artery embolization. We met in consultation 02/15/23 and discussed the rationale, risks, benefits, and periprocedural expectations regarding GAE of the left knee. He was agreeable to proceed and he presents today to the Interventional Radiology outpatient clinic for left knee GAE.   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"   CAD (coronary artery disease)    CABG 2001   Diverticulosis    DJD (degenerative joint disease)    BACK   GERD (gastroesophageal reflux disease)    H/O inguinal hernia repair 12/2018   History of gout    Hyperlipidemia    Hypertension    IBS (irritable bowel syndrome)    Obesity    OSA (obstructive sleep apnea)    USES CPAP    Pancreatitis ~ 1980   Peripheral vascular disease (HCC)    Pre-diabetes    Prostate cancer (HCC) 04/2010    Past Surgical History:  Procedure Laterality Date   ANTERIOR CERVICAL DECOMP/DISCECTOMY FUSION  05/26/2006   PLATE PLACED   BACK SURGERY     CARDIAC CATHETERIZATION  11/20/1999   CORONARY ARTERY BYPASS GRAFT  11/20/1999    SVG-RI1-RI2, SVG-OM, SVG-dRCA   CORONARY STENT INTERVENTION N/A 04/13/2019   Procedure: CORONARY STENT INTERVENTION;  Surgeon: Kathleene Hazel, MD;  Location: [facility];  Service: Cardiovascular;  Laterality: N/A;   HERNIA REPAIR     INGUINAL HERNIA REPAIR Left 01/02/2019   Procedure: OPEN LEFT INGUINAL HERNIA REPAIR WITH MESH;  Surgeon: Luretha Murphy, MD;  Location: [facility];  Service: General;  Laterality: Left;   IR RADIOLOGIST EVAL & MGMT  02/05/2023   KNEE ARTHROSCOPY Right 1992; 04/2008   LAPAROSCOPIC CHOLECYSTECTOMY  2007   LEFT HEART CATH AND CORS/GRAFTS ANGIOGRAPHY N/A 04/13/2019   Procedure: LEFT HEART CATH AND CORS/GRAFTS ANGIOGRAPHY;  Surgeon: Kathleene Hazel, MD;  Location: [facility];  Service: Cardiovascular;  Laterality: N/A;   NASAL SEPTUM SURGERY Right 2007   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   ROBOT ASSISTED LAPAROSCOPIC RADICAL PROSTATECTOMY  04/2010   THYROIDECTOMY, PARTIAL  1980   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   Procedure: LEFT TOTAL KNEE ARTHROPLASTY;  Surgeon: Marcene Corning, MD;  Location: [facility];  Service: Orthopedics;  Laterality: Left;    Allergies: Codeine, Lexapro [escitalopram], Bromfed, Clarithromycin, Cymbalta [duloxetine hcl], Doxycycline hyclate, Modafinil, Oxycodone-acetaminophen, Promethazine hcl, Quinolones, Telithromycin, Tramadol, Benadryl [diphenhydramine], Hydrocodone-acetaminophen, Repatha [evolocumab], Statins, and Sulfonamide derivatives  Medications: Prior to Admission medications   Medication Sig Start Date End Date Taking? Authorizing Provider  acetaminophen (TYLENOL) 500 MG tablet Take 1,000 mg by mouth every 6 (six) hours as needed for moderate pain or headache.    [provider]  albuterol (PROAIR HFA) 108 (90 Base) MCG/ACT inhaler Inhale 1-2 puffs into the lungs every 6 (six) hours as needed for wheezing or shortness of breath. 12/06/21   Dulce Sellar, NP  Alirocumab (PRALUENT) 150  MG/ML SOAJ INJECT 1 DOSE INTO THE SKIN EVERY 14 (FOURTEEN) DAYS. 12/08/22   Hilty, Lisette Abu, MD  amLODipine (NORVASC) 5 MG tablet TAKE 1 TABLET (5 MG TOTAL) BY MOUTH DAILY. 02/03/23   Chrystie Nose, MD  aspirin EC 81 MG tablet Take 1 tablet (81 mg total) by mouth 2 (two) times daily after a meal. 06/18/20   Elodia Florence, PA-C  cholecalciferol (VITAMIN D3) 25 MCG (1000 UNIT) tablet Take 1,000 Units by mouth daily.    [provider]  Cyanocobalamin (B-12) 2500 MCG TABS Take 1,250 mcg by mouth daily.    [provider]  diazepam (VALIUM) 5 MG tablet TAKE 1 TABLET (5 MG TOTAL) BY MOUTH AT BEDTIME AS NEEDED (SLEEP). 10/22/22   Shelva Majestic, MD  EPINEPHrine 0.3 mg/0.3 mL IJ SOAJ injection Inject 0.3 mg into the muscle as needed for anaphylaxis. for allergic reaction 04/24/19   [provider]  ezetimibe (ZETIA) 10 MG tablet TAKE 1 TABLET BY MOUTH EVERY DAY 02/03/23   Rollene Rotunda, MD  fluticasone (FLONASE) 50 MCG/ACT nasal spray Place 1 spray into both nostrils daily. 09/05/12   Stacie Glaze, MD  Fluticasone Furoate (ARNUITY ELLIPTA) 100 MCG/ACT AEPB Inhale 1 Inhalation into the lungs daily.    [provider]  folic acid (FOLVITE) 800 MCG tablet Take 800 mcg by mouth daily.     [provider]  gabapentin (NEURONTIN) 300 MG capsule Take 2 cap in AM, 1 cap at noon, 2caps at bedtime 04/27/22   Van Clines, MD  Glucos-Chond-Hyal Ac-Ca Fructo (MOVE FREE JOINT HEALTH ADVANCE PO) Take 1 tablet by mouth daily.    [provider]  hydrochlorothiazide (HYDRODIURIL) 25 MG tablet TAKE 1 TABLET BY MOUTH EVERY DAY 02/02/23   Shelva Majestic, MD  ketoconazole (NIZORAL) 2 % cream Apply 1 Application topically 2 (two) times daily as needed. 01/25/23   [provider]  Lidocaine HCl (ASPERCREME LIDOCAINE) 4 % CREA Apply topically in the morning and at bedtime.    [provider]  linaclotide Karlene Einstein) 145 MCG CAPS capsule Take 1 capsule  (145 mcg total) by mouth daily before breakfast. 02/16/23   McMichael, Bayley M, PA-C  methylPREDNISolone (MEDROL DOSEPAK) 4 MG TBPK tablet Taper Per Instructions 03/05/23   Tigran Haynie, Thressa Sheller, MD  mirabegron ER (MYRBETRIQ) 50 MG TB24 tablet Take 1 tablet (50 mg total) by mouth daily. 10/01/22   Shelva Majestic, MD  nitroGLYCERIN (NITROSTAT) 0.4 MG SL tablet Place 1 tablet (0.4 mg total) under the tongue every 5 (five) minutes as needed for chest pain. 12/11/22   Shelva Majestic, MD  nystatin ointment (MYCOSTATIN) Apply 1 Application topically 2 (two) times daily. 08/05/22   [name], MD  pantoprazole (PROTONIX) 40 MG tablet TAKE 1 TABLET BY MOUTH EVERY DAY 07/08/22   Hilty, Lisette Abu, MD  polyethylene glycol powder (GLYCOLAX/MIRALAX) powder Take 17 g by mouth daily. Patient taking differently: Take 17 g by mouth in the morning and at bedtime. 08/29/14   Rolland Porter, MD  Probiotic Product (PROBIOTIC PO) Take by mouth in the morning.  [provider]  valsartan (DIOVAN) 320 MG tablet TAKE 1 TABLET BY MOUTH EVERY DAY 04/24/22   Shelva Majestic, MD  Wheat Dextrin (BENEFIBER) POWD Take 1 Dose by mouth daily. 1 dose = 2 teaspoons    [provider]     Family History  Problem Relation Age of Onset   Heart disease Mother    Hyperlipidemia Mother    Heart disease Father    Hyperlipidemia Father    Diabetes Brother    Colon cancer Neg Hx    Pancreatic cancer Neg Hx    Esophageal cancer Neg Hx    Stomach cancer Neg Hx    Liver cancer Neg Hx     Social History   Socioeconomic History   Marital status: Married    Spouse name: Not on file   Number of children: Not on file   Years of education: Not on file   Highest education level: Associate degree: academic program  Occupational History   Occupation: retired    Associate Professor: RETIRED  Tobacco Use   Smoking status: Former    Current packs/day: 0.00    Average packs/day: 1 pack/day for 35.0 years (35.0 ttl pk-yrs)     Types: Cigarettes    Start date: 06/16/1957    Quit date: 06/16/1992    Years since quitting: 30.7   Smokeless tobacco: Never  Vaping Use   Vaping status: Never Used  Substance and Sexual Activity   Alcohol use: Yes    Alcohol/week: 2.0 standard drinks of alcohol    Types: 2 Shots of liquor per week    Comment: occ   Drug use: No   Sexual activity: Not Currently    Partners: Female  Other Topics Concern   Not on file  Social History Narrative   Married 42 years in 2015. 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      Hobbies: poker, movies and tv, staying active      Right handed   2 level home with steps he uses   Social Drivers of Corporate investment banker Strain: Low Risk  (02/09/2023)   Overall Financial Resource Strain (CARDIA)    Difficulty of Paying Living Expenses: Not hard at all  Food Insecurity: No Food Insecurity (02/09/2023)   Hunger Vital Sign    Worried About Running Out of Food in the Last Year: Never true    Ran Out of Food in the Last Year: Never true  Transportation Needs: No Transportation Needs (02/09/2023)   PRAPARE - Administrator, Civil Service (Medical): No    Lack of Transportation (Non-Medical): No  Physical Activity: Inactive (02/09/2023)   Exercise Vital Sign    Days of Exercise per Week: 0 days    Minutes of Exercise per Session: 10 min  Stress: Stress Concern Present (02/09/2023)   [employer] of Occupational Health - Occupational Stress Questionnaire    Feeling of Stress : To some extent  Social Connections: Moderately Integrated (02/09/2023)   Social Connection and Isolation Panel [NHANES]    Frequency of Communication with Friends and Family: More than three times a week    Frequency of Social Gatherings with Friends and Family: Once a week    Attends Religious Services: Never    Database administrator or Organizations: Yes    Attends Museum/gallery exhibitions officer: More than 4  times per year    Marital Status: Married    Review of Systems: A 12 point ROS discussed and pertinent positives are indicated in the HPI above.  All other systems are negative.  Review of Systems  Vital Signs: There were no vitals taken for this visit.  Physical Exam  Imaging: IR Radiologist Eval & Mgmt Result Date: 02/05/2023 EXAM: NEW PATIENT OFFICE VISIT CHIEF COMPLAINT: See Epic note. HISTORY OF PRESENT ILLNESS: See Epic note. REVIEW OF SYSTEMS: See Epic note. PHYSICAL EXAMINATION: See Epic note. ASSESSMENT AND PLAN: See Epic note. Marliss Coots, MD [facility] Radiology Electronically Signed   By: Marliss Coots M.D.   On: 02/05/2023 12:03    Labs:  CBC: Recent Labs    08/19/22 0924 09/02/22 0955 11/19/22 0955 12/22/22 1451  WBC 8.0 7.5 8.7 7.5  HGB 14.4 13.8 15.0 14.5  HCT 42.6 41.0 41.3 43.2  PLT 238.0 225.0 203 260.0    COAGS: No results for input(s): "INR", "APTT" in the last 8760 hours.  BMP: Recent Labs    09/02/22 0955 09/18/22 0835 11/19/22 1104 12/22/22 1451  NA 134* 136 131* 137  K 4.3 3.8 4.2 4.2  CL 100 102 99 100  CO2 28 27 25  32  GLUCOSE 85 114* 92 110*  BUN 21 22 21 20   CALCIUM 9.3 9.6 9.4 9.7  CREATININE 1.46 1.40 1.12 1.35  GFRNONAA  --   --  >60  --     LIVER FUNCTION TESTS: Recent Labs    09/02/22 0955 09/18/22 0835 11/19/22 1104 12/22/22 1451  BILITOT 0.7 0.6 0.8 0.7  AST 14 17 19 17   ALT 15 22 24 18   ALKPHOS 57 51 54 60  PROT 6.4 6.5 6.4* 6.7  ALBUMIN 4.0 3.9 4.0 4.0    TUMOR MARKERS: No results for input(s): "AFPTM", "CEA", "CA199", "CHROMGRNA" in the last 8760 hours.  Assessment and Plan:  Left knee pain: Aneta Mins A. Wolfe, 82 year old male, presents today to the Interventional Radiology outpatient clinic for an image-guided left knee geniculate artery embolization.   Risks and benefits of this procedure were discussed with the  patient including, but not limited to bleeding, infection, vascular injury or contrast induced renal failure.  This interventional procedure involves the use of X-rays and because of the nature of the planned procedure, it is possible that we will have prolonged use of X-ray fluoroscopy.  Potential radiation risks to you include (but are not limited to) the following: - A slightly elevated risk for cancer  several years later in life. This risk is typically less than 0.5% percent. This risk is low in comparison to the normal incidence of human cancer, which is 33% for women and 50% for men according to the American Cancer Society. - Radiation induced injury can include skin redness, resembling a rash, tissue breakdown / ulcers and hair loss (which can be temporary or permanent).   The likelihood of either of these occurring depends on the difficulty of the procedure and whether you are sensitive to radiation due to previous procedures, disease, or genetic conditions.   IF your procedure requires a prolonged use of radiation, you will be notified and given written instructions for further action.  It is your responsibility to monitor the irradiated area for the 2 weeks following the procedure and to notify your physician if you are concerned that you have suffered a radiation induced injury.    All of the patient's questions were answered, patient is  agreeable to proceed. He has been NPO.   Consent signed and in chart.  Thank you for this interesting consult.  I greatly enjoyed meeting Joshua [name] and look forward to participating in their care.  A copy of this report was sent to the requesting provider on this date.  Electronically Signed: Alwyn Ren, AGACNP-BC [phone] 03/05/2023, 4:29 PM   I spent a total of  30 Minutes   in face to face in clinical consultation, greater than 50% of which was counseling/coordinating care for left knee pain.

## 2023-03-05 NOTE — Telephone Encounter (Signed)
Pt. Was called and reminded about upcoming GAE procedure on 03/08/23 and told that medrol pak was called in to [facility] in [place]. It is to be taken as prescribed after his GAE procedure.

## 2023-03-05 NOTE — Telephone Encounter (Signed)
I HAVE RECEIVED PT PAGES AND OOP EXPENSE REPORT for ARNUITY (GSK)  I will be scanning app in into media of chart

## 2023-03-08 ENCOUNTER — Ambulatory Visit
Admission: RE | Admit: 2023-03-08 | Discharge: 2023-03-08 | Disposition: A | Discharge status: Home / Self Care | Payer: PPO | Source: Ambulatory Visit | Attending: Interventional Radiology | Admitting: Interventional Radiology

## 2023-03-08 DIAGNOSIS — M25561 Pain in right knee: Secondary | ICD-10-CM | POA: Diagnosis not present

## 2023-03-08 DIAGNOSIS — M25562 Pain in left knee: Secondary | ICD-10-CM | POA: Diagnosis not present

## 2023-03-08 DIAGNOSIS — M1712 Unilateral primary osteoarthritis, left knee: Secondary | ICD-10-CM

## 2023-03-08 DIAGNOSIS — Z96651 Presence of right artificial knee joint: Secondary | ICD-10-CM | POA: Diagnosis not present

## 2023-03-08 DIAGNOSIS — Z96652 Presence of left artificial knee joint: Secondary | ICD-10-CM | POA: Diagnosis not present

## 2023-03-08 DIAGNOSIS — I10 Essential (primary) hypertension: Secondary | ICD-10-CM | POA: Diagnosis not present

## 2023-03-08 HISTORY — PX: IR EMBO ARTERIAL NOT HEMORR HEMANG INC GUIDE ROADMAPPING: IMG5448

## 2023-03-08 MED ORDER — ONDANSETRON HCL 4 MG/2ML IJ SOLN
INTRAMUSCULAR | Status: DC | PRN
  Administered 2023-03-08: 4 mg via INTRAVENOUS

## 2023-03-08 MED ORDER — FENTANYL CITRATE PF 50 MCG/ML IJ SOSY: 25.0000 ug | PREFILLED_SYRINGE | INTRAMUSCULAR | Status: DC | PRN

## 2023-03-08 MED ORDER — MIDAZOLAM HCL 2 MG/2ML IJ SOLN
INTRAMUSCULAR | Status: DC | PRN
  Administered 2023-03-08: 1 mg via INTRAVENOUS

## 2023-03-08 MED ORDER — IODIXANOL 320 MG/ML IV SOLN: 45.0000 mL | Freq: Once | INTRAVENOUS | Status: DC | PRN

## 2023-03-08 MED ORDER — FENTANYL CITRATE (PF) 100 MCG/2ML IJ SOLN
INTRAMUSCULAR | Status: DC | PRN
  Administered 2023-03-08: 25 ug via INTRAVENOUS
  Administered 2023-03-08: 50 ug via INTRAVENOUS

## 2023-03-08 MED ORDER — SODIUM CHLORIDE 0.9 % IV SOLN
10.0000 mg | Freq: Once | INTRAVENOUS | Status: AC
  Administered 2023-03-08: 10 mg via INTRAVENOUS

## 2023-03-08 MED ORDER — KETOROLAC TROMETHAMINE 30 MG/ML IJ SOLN
30.0000 mg | Freq: Once | INTRAMUSCULAR | Status: AC
  Administered 2023-03-08: 30 mg via INTRAVENOUS

## 2023-03-08 MED ORDER — MIDAZOLAM HCL 2 MG/2ML IJ SOLN: 1.0000 mg | INTRAMUSCULAR | Status: DC | PRN

## 2023-03-08 MED ORDER — ACETAMINOPHEN 10 MG/ML IV SOLN
1000.0000 mg | Freq: Once | INTRAVENOUS | Status: AC
  Administered 2023-03-08: 1000 mg via INTRAVENOUS

## 2023-03-08 MED ORDER — SODIUM CHLORIDE 0.9 % IV SOLN: INTRAVENOUS | Status: DC

## 2023-03-08 NOTE — Progress Notes (Signed)
Pt back in nursing recovery area. Pt alert and oriented from procedure but will wake up when spoken to. Pt follows commands, talks in complete sentences and has no complaints at this time. Pt will remain in nursing station until discharge.

## 2023-03-08 NOTE — Discharge Instructions (Signed)
 Discharge Instructions for Genicular Artery Embolization (GAE)   Post-Procedure Care   Activity:   Rest for the remainder of the day.   Avoid strenuous activities and heavy lifting for 48 hours.   Gradually resume normal activities as tolerated.   Pain Management:   You may experience mild pain or discomfort at the catheter insertion site or in the knee. This is normal.   Use over-the-counter pain relievers such as acetaminophen (Tylenol) or ibuprofen (Advil) as directed.   Apply an ice pack to the knee for 15-20 minutes every 2-3 hours to reduce swelling and discomfort.   Take Sol-Medrol Pack as directed per pharmacy.   Wound Care:   Keep the catheter insertion site clean and dry.   Remove the bandage after 24 hours and replace it with a clean, dry bandage if needed.   Avoid soaking in baths, hot tubs, or swimming pools for 5 days. Showers are allowed.   Medications:   Take prescribed medications as directed.   If you were taking blood thinners, follow your physician's instructions on when to resume them.   Diet:   Resume your normal diet.   Drink plenty of fluids to stay hydrated.   Follow-Up:   Schedule a follow-up appointment with your physician as instructed.   Contact your physician if you experience increased pain, swelling, redness, or drainage at the insertion site, or if you have a fever over 100.14F (38C).   When to Seek Immediate Medical Attention   Call [phone] with any concerns:   Signs of infection at the catheter site (redness, warmth, pus).   Sudden weakness or numbness in the leg.      Call 911 if:   Difficulty breathing or chest pain.   You have severe pain in your abdomen, and it does not get better with medicine.     You have leg pain or leg swelling.   You feel dizzy, or you faint.   Do not wait to see if the symptoms will go away.   Do not drive yourself to the hospital.   Please ensure you follow these instructions  carefully and reach out to your healthcare provider if you have any concerns or questions. Wishing you a smooth and speedy recovery!

## 2023-03-08 NOTE — Procedures (Signed)
Interventional Radiology Procedure Note  Procedure: Left geniculate artery embolization  Findings: Please refer to procedural dictation for full description. Articular branch of left descending geniculate artery - 0.6 mL dilute 250 micron Embozene particles.  Left proximal SFA antegrade access, 4 Fr, manual compression.  Complications: None immediate  Estimated Blood Loss: < 5 mL  Recommendations: 1 hour bedrest IR will arrange 1 month follow up in clinic.   Marliss Coots, MD

## 2023-03-09 ENCOUNTER — Telehealth: Payer: Self-pay | Admitting: Gastroenterology

## 2023-03-09 MED ORDER — LUBIPROSTONE 8 MCG PO CAPS: 8.0000 ug | ORAL_CAPSULE | Freq: Two times a day (BID) | ORAL | 1 refills | Status: DC

## 2023-03-09 NOTE — Telephone Encounter (Signed)
I have spoken to patient who states that since last time we talked, he is no longer having pressure in his rectum and has been having some bowel movements, however, he still doesn't feel that it is very effective for him. Still goes several days without bowel movements and has abdominal cramping. At last communication, [facility], PA-C offered to change patient to amitiza 8 mcg twice daily to see if this was more helpful. Advised patient we will send Amitiza to his pharmacy to see if this is more effective. If not, he is to let us know. Patient verbalizes understanding.

## 2023-03-09 NOTE — Telephone Encounter (Signed)
Inbound call from patient stating he does not believe Linzess is working for him. States he still feels backed up and experiencing a lot of abdominal cramps. Patient is requesting a call back. Please advise, thank you.

## 2023-03-10 ENCOUNTER — Other Ambulatory Visit: Payer: PPO

## 2023-03-10 ENCOUNTER — Ambulatory Visit: Payer: PPO | Admitting: Family Medicine

## 2023-03-11 ENCOUNTER — Other Ambulatory Visit: Payer: PPO

## 2023-03-11 NOTE — Progress Notes (Unsigned)
[name] [address] [place] [address] Phone: [phone] Subjective:   Joshua [name], am serving as a scribe for Dr. Antoine Primas. I'm seeing this patient by the request  of:  Shelva Majestic, MD  CC: Low back pain  VHQ:[id number]  12/22/2022 Responded to the initial geniculate block and is going to have radiofrequency ablation of this area in the near future.     As stated previously.  Has not made significant strides at this time with the back pain.  Continue to make increasing discomfort and will monitor.  Follow-up again in 6 to 8 weeks     Previously has had stones and does have some mild CVA tenderness, will check c-Met as well as a urinalysis.  Will see if what is potentially contributing to some of his back pain.  Discussed with patient and decided because he is in so much pain he will deal with the steroid injections side effects as well as take the Toradol.  Given those as well and hopefully this will make significant improvement.  Discussed icing regimen and home exercises.  Follow-up again in 4 weeks      Update 03/15/2023 JESSIAH HIPPLE is a 82 y.o. male coming in with complaint of lumbar spine pain. Patient states that his knee is doing much better. Able to navigate stairs with less pain.   Back pain is the same as last visit. Feels back pain more now that knee pain is under control.        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"   CAD (coronary artery disease)    CABG 2001   Diverticulosis    DJD (degenerative joint disease)    BACK   GERD (gastroesophageal reflux disease)    H/O inguinal hernia repair 12/2018   History of gout    Hyperlipidemia    Hypertension    IBS (irritable bowel syndrome)     Obesity    OSA (obstructive sleep apnea)    USES CPAP    Pancreatitis ~ 1980   Peripheral vascular disease (HCC)    Pre-diabetes    Prostate cancer (HCC) 04/2010   Past Surgical History:  Procedure Laterality Date   ANTERIOR CERVICAL DECOMP/DISCECTOMY FUSION  05/26/2006   PLATE PLACED   BACK SURGERY     CARDIAC CATHETERIZATION  11/20/1999   CORONARY ARTERY BYPASS GRAFT  11/20/1999   SVG-RI1-RI2, SVG-OM, SVG-dRCA   CORONARY STENT INTERVENTION N/A 04/13/2019   Procedure: CORONARY STENT INTERVENTION;  Surgeon: Kathleene Hazel, MD;  Location: [facility];  Service: Cardiovascular;  Laterality: N/A;   HERNIA REPAIR     INGUINAL HERNIA REPAIR Left 01/02/2019   Procedure: OPEN LEFT INGUINAL HERNIA REPAIR WITH MESH;  Surgeon: Luretha Murphy, MD;  Location: [facility];  Service: General;  Laterality: Left;   IR EMBO ARTERIAL NOT HEMORR HEMANG INC GUIDE ROADMAPPING  03/08/2023   IR RADIOLOGIST EVAL & MGMT  02/05/2023   KNEE ARTHROSCOPY Right 1992; 04/2008   LAPAROSCOPIC CHOLECYSTECTOMY  2007   LEFT HEART CATH AND CORS/GRAFTS ANGIOGRAPHY N/A 04/13/2019   Procedure: LEFT HEART CATH AND CORS/GRAFTS ANGIOGRAPHY;  Surgeon: Kathleene Hazel, MD;  Location: [facility];  Service: Cardiovascular;  Laterality: N/A;   NASAL SEPTUM SURGERY Right 2007   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   ROBOT ASSISTED LAPAROSCOPIC RADICAL PROSTATECTOMY  04/2010   THYROIDECTOMY, PARTIAL  1980   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   Procedure: LEFT TOTAL KNEE ARTHROPLASTY;  Surgeon: Marcene Corning, MD;  Location: [facility];  Service: Orthopedics;  Laterality: Left;   Social History   Socioeconomic History   Marital status: Married    Spouse name: Not on file   Number of children: Not on file   Years of education: Not on file   Highest education level: Associate degree: academic program  Occupational History   Occupation: retired    Associate Professor: RETIRED  Tobacco Use   Smoking status:  Former    Current packs/day: 0.00    Average packs/day: 1 pack/day for 35.0 years (35.0 ttl pk-yrs)    Types: Cigarettes    Start date: 06/16/1957    Quit date: 06/16/1992    Years since quitting: 30.7   Smokeless tobacco: Never  Vaping Use   Vaping status: Never Used  Substance and Sexual Activity   Alcohol use: Yes    Alcohol/week: 2.0 standard drinks of alcohol    Types: 2 Shots of liquor per week    Comment: occ   Drug use: No   Sexual activity: Not Currently    Partners: Female  Other Topics Concern   Not on file  Social History Narrative   Married 42 years in 2015. 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      Hobbies: poker, movies and tv, staying active      Right handed   2 level home with steps he uses   Social Drivers of Corporate investment banker Strain: Low Risk  (02/09/2023)   Overall Financial Resource Strain (CARDIA)    Difficulty of Paying Living Expenses: Not hard at all  Food Insecurity: No Food Insecurity (02/09/2023)   Hunger Vital Sign    Worried About Running Out of Food in the Last Year: Never true    Ran Out of Food in the Last Year: Never true  Transportation Needs: No Transportation Needs (02/09/2023)   PRAPARE - Administrator, Civil Service (Medical): No    Lack of Transportation (Non-Medical): No  Physical Activity: Inactive (02/09/2023)   Exercise Vital Sign    Days of Exercise per Week: 0 days    Minutes of Exercise per Session: 10 min  Stress: Stress Concern Present (02/09/2023)   [employer] of Occupational Health - Occupational Stress Questionnaire    Feeling of Stress : To some extent  Social Connections: Moderately Integrated (02/09/2023)   Social Connection and Isolation Panel [NHANES]    Frequency of Communication with Friends and Family: More than three times a week    Frequency of Social Gatherings with Friends and Family: Once a week     Attends Religious Services: Never    Database administrator or Organizations: Yes    Attends Engineer, structural: More than 4 times per year    Marital Status: Married   Allergies  Allergen Reactions   Codeine Other (See Comments)    Trouble breathing   Lexapro [Escitalopram]     Dizziness   Bromfed Nausea And Vomiting   Clarithromycin Other (See Comments)     gastritis   Cymbalta [Duloxetine Hcl]  Abdominal pain and later chest pain as well as worsening fatigue    Doxycycline Hyclate Nausea And Vomiting   Modafinil Other (See Comments)     anxiety-nervousness   Oxycodone-Acetaminophen Itching   Promethazine Hcl Other (See Comments)     fainting   Quinolones Nausea Only    Cipro "felt real bad"   Telithromycin Nausea And Vomiting   Tramadol Nausea Only   Benadryl [Diphenhydramine] Other (See Comments)    Pt told not to take because of bypass surgery   Hydrocodone-Acetaminophen Rash   Repatha [Evolocumab]     Weak, worn out   Statins Other (See Comments)    Leg cramps and makes patient feel bad. Per pt tried multiple in years past   Sulfonamide Derivatives Rash   Family History  Problem Relation Age of Onset   Heart disease Mother    Hyperlipidemia Mother    Heart disease Father    Hyperlipidemia Father    Diabetes Brother    Colon cancer Neg Hx    Pancreatic cancer Neg Hx    Esophageal cancer Neg Hx    Stomach cancer Neg Hx    Liver cancer Neg Hx     Current Outpatient Medications (Endocrine & Metabolic):    methylPREDNISolone (MEDROL DOSEPAK) 4 MG TBPK tablet, Taper Per Instructions  Current Outpatient Medications (Cardiovascular):    Alirocumab (PRALUENT) 150 MG/ML SOAJ, INJECT 1 DOSE INTO THE SKIN EVERY 14 (FOURTEEN) DAYS.   amLODipine (NORVASC) 5 MG tablet, TAKE 1 TABLET (5 MG TOTAL) BY MOUTH DAILY.   EPINEPHrine 0.3 mg/0.3 mL IJ SOAJ injection, Inject 0.3 mg into the muscle as needed for anaphylaxis. for allergic reaction   ezetimibe (ZETIA)  10 MG tablet, TAKE 1 TABLET BY MOUTH EVERY DAY   hydrochlorothiazide (HYDRODIURIL) 25 MG tablet, TAKE 1 TABLET BY MOUTH EVERY DAY   nitroGLYCERIN (NITROSTAT) 0.4 MG SL tablet, Place 1 tablet (0.4 mg total) under the tongue every 5 (five) minutes as needed for chest pain.   valsartan (DIOVAN) 320 MG tablet, TAKE 1 TABLET BY MOUTH EVERY DAY  Current Outpatient Medications (Respiratory):    albuterol (PROAIR HFA) 108 (90 Base) MCG/ACT inhaler, Inhale 1-2 puffs into the lungs every 6 (six) hours as needed for wheezing or shortness of breath.   fluticasone (FLONASE) 50 MCG/ACT nasal spray, Place 1 spray into both nostrils daily.   Fluticasone Furoate (ARNUITY ELLIPTA) 100 MCG/ACT AEPB, Inhale 1 Inhalation into the lungs daily.  Current Outpatient Medications (Analgesics):    acetaminophen (TYLENOL) 500 MG tablet, Take 1,000 mg by mouth every 6 (six) hours as needed for moderate pain or headache.   aspirin EC 81 MG tablet, Take 1 tablet (81 mg total) by mouth 2 (two) times daily after a meal.  Current Outpatient Medications (Hematological):    Cyanocobalamin (B-12) 2500 MCG TABS, Take 1,250 mcg by mouth daily.   folic acid (FOLVITE) 800 MCG tablet, Take 800 mcg by mouth daily.   Current Outpatient Medications (Other):    cholecalciferol (VITAMIN D3) 25 MCG (1000 UNIT) tablet, Take 1,000 Units by mouth daily.   diazepam (VALIUM) 5 MG tablet, TAKE 1 TABLET (5 MG TOTAL) BY MOUTH AT BEDTIME AS NEEDED (SLEEP).   gabapentin (NEURONTIN) 300 MG capsule, Take 2 cap in AM, 1 cap at noon, 2caps at bedtime   Glucos-Chond-Hyal Ac-Ca Fructo (MOVE FREE JOINT HEALTH ADVANCE PO), Take 1 tablet by mouth daily.   ketoconazole (NIZORAL) 2 % cream, Apply 1 Application topically 2 (two) times daily as needed.  Lidocaine HCl (ASPERCREME LIDOCAINE) 4 % CREA, Apply topically in the morning and at bedtime.   lubiprostone (AMITIZA) 8 MCG capsule, Take 1 capsule (8 mcg total) by mouth 2 (two) times daily with a meal.  Pharmacy, please d/c linzess 145. Not effective   mirabegron ER (MYRBETRIQ) 50 MG TB24 tablet, Take 1 tablet (50 mg total) by mouth daily.   nystatin ointment (MYCOSTATIN), Apply 1 Application topically 2 (two) times daily.   pantoprazole (PROTONIX) 40 MG tablet, TAKE 1 TABLET BY MOUTH EVERY DAY   polyethylene glycol powder (GLYCOLAX/MIRALAX) powder, Take 17 g by mouth daily. (Patient taking differently: Take 17 g by mouth in the morning and at bedtime.)   Probiotic Product (PROBIOTIC PO), Take by mouth in the morning.   Wheat Dextrin (BENEFIBER) POWD, Take 1 Dose by mouth daily. 1 dose = 2 teaspoons   Reviewed prior external information including notes and imaging from  primary care provider As well as notes that were available from care everywhere and other healthcare systems.  Past medical history, social, surgical and family history all reviewed in electronic medical record.  No pertanent information unless stated regarding to the chief complaint.   Review of Systems:  No headache, visual changes, nausea, vomiting, diarrhea, constipation, dizziness, abdominal pain, skin rash, fevers, chills, night sweats, weight loss, swollen lymph nodes,  joint swelling, chest pain, shortness of breath, mood changes. POSITIVE muscle aches, body aches, back pain being the most severe  Objective  Blood pressure 108/68, pulse 86, height 5\' 6"  (1.676 m), weight 159 lb (72.1 kg), SpO2 97%.   General: No apparent distress alert and oriented x3 mood and affect normal, dressed appropriately.  HEENT: Pupils equal, extraocular movements intact  Respiratory: Patient's speak in full sentences and does not appear short of breath  Cardiovascular: No lower extremity edema, non tender, no erythema  Back exam does have some loss lordosis noted.  Tenderness to palpation of the paraspinal musculature patient has some difficulty going from a seated to standing position.    Impression and Recommendations:    The above  documentation has been reviewed and is accurate and complete Judi Saa, DO

## 2023-03-11 NOTE — Telephone Encounter (Signed)
Application discontinued for 2024. Reached out to pt on 03/05/23 via mychart message.

## 2023-03-15 ENCOUNTER — Ambulatory Visit: Payer: PPO | Admitting: Family Medicine

## 2023-03-15 ENCOUNTER — Encounter: Payer: Self-pay | Admitting: Family Medicine

## 2023-03-15 ENCOUNTER — Telehealth: Payer: Self-pay

## 2023-03-15 VITALS — BP 108/68 | HR 86 | Ht 66.0 in | Wt 159.0 lb

## 2023-03-15 DIAGNOSIS — M5442 Lumbago with sciatica, left side: Secondary | ICD-10-CM | POA: Diagnosis not present

## 2023-03-15 DIAGNOSIS — G8929 Other chronic pain: Secondary | ICD-10-CM

## 2023-03-15 NOTE — Patient Instructions (Signed)
I will send note to Dr. Lorrine Kin See me again in 2-3 months if needed Will have a plan by end of this week

## 2023-03-15 NOTE — Assessment & Plan Note (Addendum)
Unfortunately continues to have back pain that is out of proportion.  Affecting daily activities.  Unable to do repetitive steroids secondary to the side effects.  Discussed which activities to do and which ones to avoid.  Increase activity slowly.  Discussed icing regimen and home exercises, discussed avoiding certain activities.  Has failed everything including physical therapy, has seen other providers and has had injections but secondary to difficulty with steroids he would like to avoid that at the moment.  Due to patient's other comorbidities would like to avoid a large back surgery.  Encouraged him to maybe check again with his PMNR physician to see if he is a candidate for oral spinal stimulator.  Follow-up again in 8 weeks otherwise.  Total time with patient and reviewing the chart 31 minutes

## 2023-03-15 NOTE — Telephone Encounter (Signed)
Phone call to pt to follow up from his [facility] on 03/08/23. Pt. Reports that his knee pain is some better. Pt. Denies any signs of bleeding, infection, redness at the left femoral site, drainage at the site, fever, nausea, or vomiting. Pt has no complaints at this time. Dr. Elby Showers will call him within the month to check on his status post-procedure. Pt advised to call back if anything were to change or any concerns arise and we will arrange an in person appointment. Pt verbalized understanding.

## 2023-03-18 ENCOUNTER — Other Ambulatory Visit (HOSPITAL_BASED_OUTPATIENT_CLINIC_OR_DEPARTMENT_OTHER): Payer: Self-pay

## 2023-03-18 ENCOUNTER — Other Ambulatory Visit: Payer: Self-pay

## 2023-03-18 ENCOUNTER — Encounter (HOSPITAL_BASED_OUTPATIENT_CLINIC_OR_DEPARTMENT_OTHER): Payer: Self-pay | Admitting: Emergency Medicine

## 2023-03-18 ENCOUNTER — Emergency Department (HOSPITAL_BASED_OUTPATIENT_CLINIC_OR_DEPARTMENT_OTHER)
Admission: EM | Admit: 2023-03-18 | Discharge: 2023-03-18 | Disposition: A | Discharge status: Home / Self Care | Payer: PPO | Attending: Emergency Medicine | Admitting: Emergency Medicine

## 2023-03-18 ENCOUNTER — Emergency Department (HOSPITAL_BASED_OUTPATIENT_CLINIC_OR_DEPARTMENT_OTHER): Payer: PPO

## 2023-03-18 DIAGNOSIS — R109 Unspecified abdominal pain: Secondary | ICD-10-CM | POA: Diagnosis not present

## 2023-03-18 DIAGNOSIS — K5732 Diverticulitis of large intestine without perforation or abscess without bleeding: Secondary | ICD-10-CM | POA: Insufficient documentation

## 2023-03-18 DIAGNOSIS — Z7982 Long term (current) use of aspirin: Secondary | ICD-10-CM | POA: Insufficient documentation

## 2023-03-18 DIAGNOSIS — M1712 Unilateral primary osteoarthritis, left knee: Secondary | ICD-10-CM | POA: Diagnosis not present

## 2023-03-18 DIAGNOSIS — K4020 Bilateral inguinal hernia, without obstruction or gangrene, not specified as recurrent: Secondary | ICD-10-CM | POA: Diagnosis not present

## 2023-03-18 DIAGNOSIS — K760 Fatty (change of) liver, not elsewhere classified: Secondary | ICD-10-CM | POA: Diagnosis not present

## 2023-03-18 DIAGNOSIS — I10 Essential (primary) hypertension: Secondary | ICD-10-CM | POA: Diagnosis not present

## 2023-03-18 DIAGNOSIS — R001 Bradycardia, unspecified: Secondary | ICD-10-CM | POA: Diagnosis not present

## 2023-03-18 DIAGNOSIS — I2510 Atherosclerotic heart disease of native coronary artery without angina pectoris: Secondary | ICD-10-CM | POA: Insufficient documentation

## 2023-03-18 DIAGNOSIS — Z8551 Personal history of malignant neoplasm of bladder: Secondary | ICD-10-CM | POA: Insufficient documentation

## 2023-03-18 DIAGNOSIS — Z9049 Acquired absence of other specified parts of digestive tract: Secondary | ICD-10-CM | POA: Diagnosis not present

## 2023-03-18 DIAGNOSIS — R1084 Generalized abdominal pain: Secondary | ICD-10-CM | POA: Diagnosis present

## 2023-03-18 DIAGNOSIS — K5792 Diverticulitis of intestine, part unspecified, without perforation or abscess without bleeding: Secondary | ICD-10-CM

## 2023-03-18 LAB — COMPREHENSIVE METABOLIC PANEL
ALT: 21 U/L (ref 0–44)
AST: 20 U/L (ref 15–41)
Albumin: 3.2 g/dL — ABNORMAL LOW (ref 3.5–5.0)
Alkaline Phosphatase: 48 U/L (ref 38–126)
Anion gap: 11 (ref 5–15)
BUN: 16 mg/dL (ref 8–23)
CO2: 24 mmol/L (ref 22–32)
Calcium: 9 mg/dL (ref 8.9–10.3)
Chloride: 99 mmol/L (ref 98–111)
Creatinine, Ser: 1.31 mg/dL — ABNORMAL HIGH (ref 0.61–1.24)
GFR, Estimated: 55 mL/min — ABNORMAL LOW (ref >60)
Glucose, Bld: 154 mg/dL — ABNORMAL HIGH (ref 70–99)
Potassium: 3.5 mmol/L (ref 3.5–5.1)
Sodium: 134 mmol/L — ABNORMAL LOW (ref 135–145)
Total Bilirubin: 1.2 mg/dL — ABNORMAL HIGH (ref <1.2)
Total Protein: 6.2 g/dL — ABNORMAL LOW (ref 6.5–8.1)

## 2023-03-18 LAB — URINALYSIS, ROUTINE W REFLEX MICROSCOPIC
Bilirubin Urine: NEGATIVE
Glucose, UA: NEGATIVE mg/dL
Hgb urine dipstick: NEGATIVE
Ketones, ur: NEGATIVE mg/dL
Leukocytes,Ua: NEGATIVE
Nitrite: NEGATIVE
Protein, ur: NEGATIVE mg/dL
Specific Gravity, Urine: 1.01 (ref 1.005–1.030)
pH: 6 (ref 5.0–8.0)

## 2023-03-18 LAB — CBC
HCT: 39.6 % (ref 39.0–52.0)
Hemoglobin: 13.9 g/dL (ref 13.0–17.0)
MCH: 31.4 pg (ref 26.0–34.0)
MCHC: 35.1 g/dL (ref 30.0–36.0)
MCV: 89.4 fL (ref 80.0–100.0)
Platelets: 181 10*3/uL (ref 150–400)
RBC: 4.43 MIL/uL (ref 4.22–5.81)
RDW: 12.1 % (ref 11.5–15.5)
WBC: 11.6 10*3/uL — ABNORMAL HIGH (ref 4.0–10.5)
nRBC: 0 % (ref 0.0–0.2)

## 2023-03-18 LAB — LIPASE, BLOOD: Lipase: 28 U/L (ref 11–51)

## 2023-03-18 MED ORDER — ACETAMINOPHEN 500 MG PO TABS
1000.0000 mg | ORAL_TABLET | Freq: Once | ORAL | Status: AC
  Administered 2023-03-18: 1000 mg via ORAL
  Filled 2023-03-18: qty 2

## 2023-03-18 MED ORDER — FENTANYL CITRATE PF 50 MCG/ML IJ SOSY
25.0000 ug | PREFILLED_SYRINGE | Freq: Once | INTRAMUSCULAR | Status: AC
  Administered 2023-03-18: 25 ug via INTRAVENOUS
  Filled 2023-03-18: qty 1

## 2023-03-18 MED ORDER — AMOXICILLIN-POT CLAVULANATE 875-125 MG PO TABS
1.0000 | ORAL_TABLET | Freq: Two times a day (BID) | ORAL | 0 refills | Status: AC
  Filled 2023-03-18: qty 14, 7d supply, fill #0

## 2023-03-18 MED ORDER — AMOXICILLIN-POT CLAVULANATE 875-125 MG PO TABS
1.0000 | ORAL_TABLET | Freq: Once | ORAL | Status: AC
  Administered 2023-03-18: 1 via ORAL
  Filled 2023-03-18: qty 1

## 2023-03-18 MED ORDER — IOHEXOL 300 MG/ML  SOLN
100.0000 mL | Freq: Once | INTRAMUSCULAR | Status: AC | PRN
  Administered 2023-03-18: 100 mL via INTRAVENOUS

## 2023-03-18 MED ORDER — SODIUM CHLORIDE 0.9 % IV BOLUS
1000.0000 mL | Freq: Once | INTRAVENOUS | Status: AC
  Administered 2023-03-18: 1000 mL via INTRAVENOUS

## 2023-03-18 NOTE — ED Provider Notes (Signed)
[place] EMERGENCY DEPARTMENT AT [facility] [place] Provider Note   CSN: [id number] Arrival date & time: 03/18/23  7829     History  Chief Complaint  Patient presents with  . Abdominal Pain    Joshua [name] is a 82 y.o. male.   Abdominal Pain This is an 82 year old male history of hypertension, hyperlipidemia, CAD, bladder cancer status post prostatectomy presenting for abdominal pain.  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  Follows with GI and started lubiprostone on Monday.  He does not think this has changed symptoms.  Over last 2 days she has had persistent periumbilical abdominal pain and pain across his lower abdomen.  Is been constant, not worse with eating but has had decreased p.o. intake.  No nausea or vomiting.  Slightly decreased stool and flatus output.  No fevers or chills.  No change in urination or pain with urination.  No flank pain.  No chest pain or shortness of breath.  No known sick contacts or recent travel.  He is otherwise been at his baseline health.  He does note some pain in his left scrotum which is chronic.  He seen dermatology and urology for this.  It is not acutely worsened or related to his current pain.  No testicular pain or penile pain.     Home Medications Prior to Admission medications   Medication Sig Start Date End Date Taking? Authorizing Provider  amoxicillin-clavulanate (AUGMENTIN) 875-125 MG tablet Take 1 tablet by mouth 2 (two) times daily for 7 days. 03/18/23 03/25/23 Yes Laurence Spates, MD  acetaminophen (TYLENOL) 500 MG tablet Take 1,000 mg by mouth every 6 (six) hours as needed for moderate pain or headache.    [provider]  albuterol (PROAIR HFA) 108 (90 Base) MCG/ACT inhaler Inhale 1-2 puffs into the lungs every 6 (six) hours as needed for wheezing or shortness of breath. 12/06/21   Dulce Sellar, NP  Alirocumab (PRALUENT) 150 MG/ML SOAJ INJECT 1 DOSE INTO THE SKIN EVERY 14 (FOURTEEN) DAYS. 12/08/22    Hilty, Lisette Abu, MD  amLODipine (NORVASC) 5 MG tablet TAKE 1 TABLET (5 MG TOTAL) BY MOUTH DAILY. 02/03/23   Chrystie Nose, MD  aspirin EC 81 MG tablet Take 1 tablet (81 mg total) by mouth 2 (two) times daily after a meal. 06/18/20   Elodia Florence, PA-C  cholecalciferol (VITAMIN D3) 25 MCG (1000 UNIT) tablet Take 1,000 Units by mouth daily.    [provider]  Cyanocobalamin (B-12) 2500 MCG TABS Take 1,250 mcg by mouth daily.    [provider]  diazepam (VALIUM) 5 MG tablet TAKE 1 TABLET (5 MG TOTAL) BY MOUTH AT BEDTIME AS NEEDED (SLEEP). 10/22/22   Shelva Majestic, MD  EPINEPHrine 0.3 mg/0.3 mL IJ SOAJ injection Inject 0.3 mg into the muscle as needed for anaphylaxis. for allergic reaction 04/24/19   [provider]  ezetimibe (ZETIA) 10 MG tablet TAKE 1 TABLET BY MOUTH EVERY DAY 02/03/23   Rollene Rotunda, MD  fluticasone (FLONASE) 50 MCG/ACT nasal spray Place 1 spray into both nostrils daily. 09/05/12   Stacie Glaze, MD  Fluticasone Furoate (ARNUITY ELLIPTA) 100 MCG/ACT AEPB Inhale 1 Inhalation into the lungs daily.    [provider]  folic acid (FOLVITE) 800 MCG tablet Take 800 mcg by mouth daily.     [provider]  gabapentin (NEURONTIN) 300 MG capsule Take 2 cap in AM, 1 cap at noon, 2caps at bedtime 04/27/22   Karel Jarvis,  Lesle Chris, MD  Glucos-Chond-Hyal Ac-Ca Fructo (MOVE FREE JOINT HEALTH ADVANCE PO) Take 1 tablet by mouth daily.    [provider]  hydrochlorothiazide (HYDRODIURIL) 25 MG tablet TAKE 1 TABLET BY MOUTH EVERY DAY 02/02/23   Shelva Majestic, MD  ketoconazole (NIZORAL) 2 % cream Apply 1 Application topically 2 (two) times daily as needed. 01/25/23   [provider]  Lidocaine HCl (ASPERCREME LIDOCAINE) 4 % CREA Apply topically in the morning and at bedtime.    [provider]  lubiprostone (AMITIZA) 8 MCG capsule Take 1 capsule (8 mcg total) by mouth 2 (two) times daily with a meal. Pharmacy, please d/c  linzess 145. Not effective 03/09/23   McMichael, Abbott Pao M, PA-C  methylPREDNISolone (MEDROL DOSEPAK) 4 MG TBPK tablet Taper Per Instructions 03/05/23   Suttle, Thressa Sheller, MD  mirabegron ER (MYRBETRIQ) 50 MG TB24 tablet Take 1 tablet (50 mg total) by mouth daily. 10/01/22   Shelva Majestic, MD  nitroGLYCERIN (NITROSTAT) 0.4 MG SL tablet Place 1 tablet (0.4 mg total) under the tongue every 5 (five) minutes as needed for chest pain. 12/11/22   Shelva Majestic, MD  nystatin ointment (MYCOSTATIN) Apply 1 Application topically 2 (two) times daily. 08/05/22   [name], MD  pantoprazole (PROTONIX) 40 MG tablet TAKE 1 TABLET BY MOUTH EVERY DAY 07/08/22   Hilty, Lisette Abu, MD  polyethylene glycol powder (GLYCOLAX/MIRALAX) powder Take 17 g by mouth daily. Patient taking differently: Take 17 g by mouth in the morning and at bedtime. 08/29/14   Rolland Porter, MD  Probiotic Product (PROBIOTIC PO) Take by mouth in the morning.    [provider]  valsartan (DIOVAN) 320 MG tablet TAKE 1 TABLET BY MOUTH EVERY DAY 04/24/22   Shelva Majestic, MD  Wheat Dextrin (BENEFIBER) POWD Take 1 Dose by mouth daily. 1 dose = 2 teaspoons    [provider]      Allergies    Codeine, Lexapro [escitalopram], Bromfed, Clarithromycin, Cymbalta [duloxetine hcl], Doxycycline hyclate, Modafinil, Oxycodone-acetaminophen, Promethazine hcl, Quinolones, Telithromycin, Tramadol, Benadryl [diphenhydramine], Hydrocodone-acetaminophen, Repatha [evolocumab], Statins, and Sulfonamide derivatives    Review of Systems   Review of Systems  Gastrointestinal:  Positive for abdominal pain.  Review of systems completed and notable as per HPI.  ROS otherwise negative.  Physical Exam Updated Vital Signs BP (!) 124/50 (BP Location: Right Arm)   Pulse (!) 40   Temp (!) 97.5 F (36.4 C) (Oral)   Resp 18   Ht 5\' 6"  (1.676 m)   Wt 72.1 kg   SpO2 96%   BMI 25.66 kg/m  Physical Exam Vitals and nursing note reviewed. Exam  conducted with a chaperone present.  Constitutional:      General: He is not in acute distress.    Appearance: He is well-developed.  HENT:     Head: Normocephalic and atraumatic.  Eyes:     Extraocular Movements: Extraocular movements intact.     Conjunctiva/sclera: Conjunctivae normal.     Pupils: Pupils are equal, round, and reactive to light.  Cardiovascular:     Rate and Rhythm: Normal rate and regular rhythm.     Pulses: Normal pulses.     Heart sounds: Normal heart sounds. No murmur heard. Pulmonary:     Effort: Pulmonary effort is normal. No respiratory distress.     Breath sounds: Normal breath sounds.  Abdominal:     Palpations: Abdomen is soft.     Tenderness: There is generalized abdominal tenderness  and tenderness in the periumbilical area. There is no guarding or rebound.  Genitourinary:    Penis: Normal.      Testes: Normal.  Musculoskeletal:        General: No swelling.     Cervical back: Neck supple.     Right lower leg: No edema.     Left lower leg: No edema.  Skin:    General: Skin is warm and dry.     Capillary Refill: Capillary refill takes less than 2 seconds.  Neurological:     Mental Status: He is alert.  Psychiatric:        Mood and Affect: Mood normal.     ED Results / Procedures / Treatments   Labs (all labs ordered are listed, but only abnormal results are displayed) Labs Reviewed  COMPREHENSIVE METABOLIC PANEL - Abnormal; Notable for the following components:      Result Value   Sodium 134 (*)    Glucose, Bld 154 (*)    Creatinine, Ser 1.31 (*)    Total Protein 6.2 (*)    Albumin 3.2 (*)    Total Bilirubin 1.2 (*)    GFR, Estimated 55 (*)    All other components within normal limits  CBC - Abnormal; Notable for the following components:   WBC 11.6 (*)    All other components within normal limits  LIPASE, BLOOD  URINALYSIS, ROUTINE W REFLEX MICROSCOPIC    EKG EKG Interpretation Date/Time:  Thursday March 18 2023 07:47:35  EST Ventricular Rate:  83 PR Interval:  193 QRS Duration:  105 QT Interval:  339 QTC Calculation: 399 R Axis:   -4  Text Interpretation: Sinus rhythm Multiple ventricular premature complexes Low voltage, precordial leads Confirmed by Fulton Reek [phone]) on 03/18/2023 8:05:43 AM  Radiology CT ABDOMEN PELVIS W CONTRAST Result Date: 03/18/2023 CLINICAL DATA:  Abdominal pain, acute, nonlocalized. EXAM: CT ABDOMEN AND PELVIS WITH CONTRAST TECHNIQUE: Multidetector CT imaging of the abdomen and pelvis was performed using the standard protocol following bolus administration of intravenous contrast. RADIATION DOSE REDUCTION: This exam was performed according to the departmental dose-optimization program which includes automated exposure control, adjustment of the mA and/or kV according to patient size and/or use of iterative reconstruction technique. CONTRAST:  OMNIPAQUE IOHEXOL 300 MG/ML  SOLN COMPARISON:  CT scan abdomen and pelvis from 11/26/2020. FINDINGS: Lower chest: The lung bases are clear. No pleural effusion. The heart is normal in size. No pericardial effusion. Hepatobiliary: The liver is normal in size. Non-cirrhotic configuration. No suspicious mass. These is mild diffuse hepatic steatosis. No intrahepatic or extrahepatic bile duct dilation. Gallbladder is surgically absent. Pancreas: Unremarkable. No pancreatic ductal dilatation or surrounding inflammatory changes. Spleen: Within normal limits. No focal lesion. Adrenals/Urinary Tract: Adrenal glands are unremarkable. No suspicious renal mass. There are multiple cysts throughout bilateral kidneys with largest arising from the right kidney lower pole measuring 2.6 x 2.9 cm. No nephroureterolithiasis or obstructive uropathy. Unremarkable urinary bladder. Stomach/Bowel: There are 2 short segments of the proximal sigmoid colon exhibiting mild irregular circumferential wall thickening on the background of several diverticula. There is  pericolonic fat stranding and prominence of vasa recta, compatible with diverticulitis. The proximally located segment measures approximately 2.5 cm in length. There is an approximately 5 cm long portion of spared proximal sigmoid colon. Distal to it, there is another approximately 4.5 cm long segment which is also affected. No pneumatosis, pneumoperitoneum, walled-off abscess or loculated collection. Unremarkable appendix. No disproportionate dilation of the small or large  bowel loops. Vascular/Lymphatic: No abdominal or pelvic lymphadenopathy, by size criteria. No aneurysmal dilation of the major abdominal arteries. There are moderate peripheral atherosclerotic vascular calcifications of the aorta and its major branches. Reproductive: Patient is status post surgical removal of prostate and bilateral seminal vesicles. Other: There are fat containing umbilical and bilateral inguinal hernias. The soft tissues and abdominal wall are otherwise unremarkable. Musculoskeletal: No suspicious osseous lesions. There are mild multilevel degenerative changes in the visualized spine. IMPRESSION: *There are 2 short segments of proximal sigmoid colon exhibiting findings compatible with acute uncomplicated diverticulitis with intervening normal colon, as described above. *Multiple other nonacute observations, as described above. Electronically Signed   By: Jules Schick M.D.   On: 03/18/2023 08:47    Procedures Procedures    Medications Ordered in ED Medications  sodium chloride 0.9 % bolus 1,000 mL (0 mLs Intravenous Stopped 03/18/23 1035)  fentaNYL (SUBLIMAZE) injection 25 mcg (25 mcg Intravenous Given 03/18/23 0748)  iohexol (OMNIPAQUE) 300 MG/ML solution 100 mL (100 mLs Intravenous Contrast Given 03/18/23 0819)  acetaminophen (TYLENOL) tablet 1,000 mg (1,000 mg Oral Given 03/18/23 0959)  amoxicillin-clavulanate (AUGMENTIN) 875-125 MG per tablet 1 tablet (1 tablet Oral Given 03/18/23 9147)    ED Course/ Medical  Decision Making/ A&P Clinical Course as of 03/18/23 1100  Thu Mar 18, 2023  1100 CT ABDOMEN PELVIS W CONTRAST [JD]    Clinical Course User Index [JD] Laurence Spates, MD                                  Medical Decision Making Amount and/or Complexity of Data Reviewed Labs: ordered. Radiology: ordered.  Risk OTC drugs. Prescription drug management.   Medical Decision Making:   QUINLAN EDLEY is a 82 y.o. male who presented to the ED today with abdominal pain.  Also notable for initial bradycardia although on my evaluation his heart rates in the 80s.  He is asymptomatic from this standpoint.  He reports 2 days of lower abdominal pain.  No vomiting or diarrhea, is passing gas.  He is tender on exam most in the lower abdomen and periumbilical region.  Different including appendicitis, cholecystitis, obstruction, diverticulitis.  He did note some chronic scrotal pain which is unchanged, scrotal exam without erythema, swelling, tenderness and no testicular pain, I do not think this is torsion.  Will obtain CT of the abdomen and lab workup.   Patient placed on continuous vitals and telemetry monitoring while in ED which was reviewed periodically.  Reviewed and confirmed nursing documentation for past medical history, family history, social history.  Reassessment and Plan:   CT scan with uncomplicated diverticulitis.  His lab work is notable for baseline renal function, very mild leukocytosis.  Vital signs remained stable.  He would like to try to go home, pain is improved.  Will trial some additional Tylenol, Augmentin here and p.o. challenge.   Patient tolerating p.o. and pain improved with Tylenol.  I offered patient admission to the hospital for diverticulitis, he would like to try to go home.  He has mild leukocytosis but not severe, his vital signs are stable he is well-appearing.  He has no signs of complicated diverticulitis.  Tolerating p.o. and having bowel movements.  His pain  is well-controlled Tylenol.  Will treat with course of Augmentin, and I given strict return precautions for any worsening symptoms including worsening pain, develop a fever, vomiting, or difficulty having bowel  movements.  I recommend he follow with his PCP within 2 to 3 days for reevaluation.  He was understanding and comfortable this plan.   Patient's presentation is most consistent with acute complicated illness / injury requiring diagnostic workup.           Final Clinical Impression(s) / ED Diagnoses Final diagnoses:  Diverticulitis    Rx / DC Orders ED Discharge Orders          Ordered    amoxicillin-clavulanate (AUGMENTIN) 875-125 MG tablet  2 times daily        03/18/23 1100              Laurence Spates, MD 03/18/23 1100

## 2023-03-18 NOTE — Discharge Instructions (Addendum)
Your CT scan showed diverticulitis which is an infection of your colon.  You are being placed on antibiotics.  Your white blood cell count was slightly high.  You should follow-up with your doctor within the next 2 to 3 days.  You can take Tylenol for pain.  You should drink plenty of fluids.  If you develop worsening pain, fever, difficulty going to the bathroom, persistent vomiting or any other new concerning symptoms you should return to the ED.

## 2023-03-18 NOTE — ED Notes (Signed)
Need UA

## 2023-03-18 NOTE — ED Triage Notes (Addendum)
Abdominal pain for 1 month.  Pt states he started a new medication (Linzess) and then one week ago changed to Libiprostone.  Pt states the pain was crampy pain and diarrhea.  Pt states he is having worse abdominal pain.  Diarrhea has subsided.  No known fever.  Pt denies N/V but admits to decreased appetite.

## 2023-03-19 ENCOUNTER — Telehealth: Payer: Self-pay

## 2023-03-19 NOTE — Transitions of Care (Post Inpatient/ED Visit) (Unsigned)
 03/19/2023  Name: Joshua RUBLEY MRN: [id number] DOB: 06-25-1940  Today's TOC FU Call Status: Today's TOC FU Call Status:: Successful TOC FU Call Completed TOC FU Call Complete Date: 03/19/23 Patient's Name and Date of Birth confirmed.  Transition Care Management Follow-up Telephone Call Date of Discharge: 03/18/23 Discharge Facility: [facility] Type of Discharge: Emergency Department Reason for ED Visit: Other: (diverticulitis of intestine) How have you been since you were released from the hospital?: Same (pt stated he is miserable) Any questions or concerns?: No  Items Reviewed: Did you receive and understand the discharge instructions provided?: Yes Medications obtained,verified, and reconciled?: Yes (Medications Reviewed) Any new allergies since your discharge?: No Dietary orders reviewed?: NA Do you have support at home?: Yes People in Home: spouse  Medications Reviewed Today: Medications Reviewed Today     Reviewed by Alyha Marines, Jordan Hawks, CMA (Certified Medical Assistant) on 03/19/23 at 1651  Med List Status: <None>   Medication Order Taking? Sig Documenting Provider Last Dose Status Informant  acetaminophen (TYLENOL) 500 MG tablet [id number] No Take 1,000 mg by mouth every 6 (six) hours as needed for moderate pain or headache. [provider] Taking Active Spouse/Significant Other  albuterol (PROAIR HFA) 108 (90 Base) MCG/ACT inhaler [id number] No Inhale 1-2 puffs into the lungs every 6 (six) hours as needed for wheezing or shortness of breath. Dulce Sellar, NP Taking Active   Alirocumab (PRALUENT) 150 MG/ML SOAJ [id number] No INJECT 1 DOSE INTO THE SKIN EVERY 14 (FOURTEEN) DAYS. Chrystie Nose, MD Taking Active   amLODipine (NORVASC) 5 MG tablet [id number] No TAKE 1 TABLET (5 MG TOTAL) BY MOUTH DAILY. Chrystie Nose, MD Taking Active   amoxicillin-clavulanate (AUGMENTIN) 875-125 MG tablet [id number]  Take 1 tablet by mouth 2 (two) times daily  for 7 days. Laurence Spates, MD  Active   aspirin EC 81 MG tablet [id number] No Take 1 tablet (81 mg total) by mouth 2 (two) times daily after a meal. Elodia Florence, PA-C Taking Active Spouse/Significant Other           Med Note [name], Barth Kirks D   Fri Nov 01, 2020  9:30 AM) Patient is taking only 1 tab/day  cholecalciferol (VITAMIN D3) 25 MCG (1000 UNIT) tablet [id number] No Take 1,000 Units by mouth daily. [provider] Taking Active Spouse/Significant Other  Cyanocobalamin (B-12) 2500 MCG TABS [id number] No Take 1,250 mcg by mouth daily. [provider] Taking Active Spouse/Significant Other  diazepam (VALIUM) 5 MG tablet [id number] No TAKE 1 TABLET (5 MG TOTAL) BY MOUTH AT BEDTIME AS NEEDED (SLEEP). Shelva Majestic, MD Taking Active   EPINEPHrine 0.3 mg/0.3 mL IJ SOAJ injection [id number] No Inject 0.3 mg into the muscle as needed for anaphylaxis. for allergic reaction [provider] Taking Active            Med Note (DAVIS, SOPHIA A   Wed Jan 29, 2021  9:10 AM) On hand  ezetimibe (ZETIA) 10 MG tablet [id number] No TAKE 1 TABLET BY MOUTH EVERY DAY Rollene Rotunda, MD Taking Active   fluticasone (FLONASE) 50 MCG/ACT nasal spray [id number] No Place 1 spray into both nostrils daily. Stacie Glaze, MD Taking Active Spouse/Significant Other  Fluticasone Furoate (ARNUITY ELLIPTA) 100 MCG/ACT AEPB [id number] No Inhale 1 Inhalation into the lungs daily. [provider] Taking Active            Med Note Clydie Braun, TAMMY B   Wed Dec 16, 2022 10:11 AM) Approved rom [employer] medication  assistance program 9/24 thru 03/23/2023  folic acid (FOLVITE) 800 MCG tablet [id number] No Take 800 mcg by mouth daily.  [provider] Taking Active Spouse/Significant Other           Med Note [name], Dallas Breeding   Fri Nov 01, 2020  9:34 AM) Pt is taking 800 mcg unable to find 400 mcg.  gabapentin (NEURONTIN) 300 MG capsule [id number] No Take 2 cap in AM, 1 cap at noon, 2caps at bedtime Van Clines, MD Taking Active   Glucos-Chond-Hyal Ac-Ca Fructo (MOVE FREE JOINT HEALTH ADVANCE PO) [id number] No Take 1 tablet by mouth daily. [provider] Taking Active Spouse/Significant Other  hydrochlorothiazide (HYDRODIURIL) 25 MG tablet [id number] No TAKE 1 TABLET BY MOUTH EVERY DAY Shelva Majestic, MD Taking Active   ketoconazole (NIZORAL) 2 % cream [id number] No Apply 1 Application topically 2 (two) times daily as needed. [provider] Taking Active   Lidocaine HCl (ASPERCREME LIDOCAINE) 4 % CREA [id number] No Apply topically in the morning and at bedtime. [provider] Taking Active   lubiprostone (AMITIZA) 8 MCG capsule [id number] No Take 1 capsule (8 mcg total) by mouth 2 (two) times daily with a meal. Pharmacy, please d/c linzess 145. Not effective McMichael, Bayley M, PA-C Taking Active   methylPREDNISolone (MEDROL DOSEPAK) 4 MG TBPK tablet [id number]  Taper Per Instructions Suttle, Thressa Sheller, MD  Active   mirabegron ER (MYRBETRIQ) 50 MG TB24 tablet [id number] No Take 1 tablet (50 mg total) by mouth daily. Shelva Majestic, MD Taking Active   nitroGLYCERIN (NITROSTAT) 0.4 MG SL tablet [id number] No Place 1 tablet (0.4 mg total) under the tongue every 5 (five) minutes as needed for chest pain. Shelva Majestic, MD Taking Active   nystatin ointment (MYCOSTATIN) [id number] No Apply 1 Application topically 2 (two) times daily. [name], MD Taking Active   pantoprazole (PROTONIX) 40 MG tablet [id number] No TAKE 1 TABLET BY MOUTH EVERY DAY Hilty, Lisette Abu, MD Taking Active   polyethylene glycol powder (GLYCOLAX/MIRALAX) powder [id number] No Take 17 g by mouth daily.  Patient taking differently: Take 17 g by mouth in the morning and at bedtime.   Rolland Porter, MD Taking Active   Probiotic Product (PROBIOTIC PO) [id number] No Take by mouth in the morning. [provider] Taking Active   valsartan (DIOVAN) 320 MG tablet [id number] No TAKE 1 TABLET BY MOUTH EVERY  DAY Shelva Majestic, MD Taking Active   Wheat Dextrin [facility]) POWD [id number] No Take 1 Dose by mouth daily. 1 dose = 2 teaspoons [provider] Taking Active Spouse/Significant Other            Home Care and Equipment/Supplies: Were Home Health Services Ordered?: NA Any new equipment or medical supplies ordered?: NA  Functional Questionnaire: Do you need assistance with bathing/showering or dressing?: No Do you need assistance with meal preparation?: No Do you need assistance with eating?: No Do you have difficulty maintaining continence: No Do you need assistance with getting out of bed/getting out of a chair/moving?: No Do you have difficulty managing or taking your medications?: No  Follow up appointments reviewed: PCP Follow-up appointment confirmed?: Yes Date of PCP follow-up appointment?: 03/22/23 Follow-up Provider: S. Durene Cal, MD Specialist Hospital Follow-up appointment confirmed?: No Reason Specialist Follow-Up Not Confirmed: Patient has Specialist Provider Number and will Call for Appointment Do you need transportation to your follow-up appointment?: No Do you understand care options if your condition(s) worsen?: Yes-patient verbalized understanding   Kazia Grisanti, CMA  [facility] Team Direct Dial: [phone]

## 2023-03-22 ENCOUNTER — Encounter: Payer: Self-pay | Admitting: Family Medicine

## 2023-03-22 ENCOUNTER — Other Ambulatory Visit: Payer: Self-pay | Admitting: Interventional Radiology

## 2023-03-22 ENCOUNTER — Ambulatory Visit (INDEPENDENT_AMBULATORY_CARE_PROVIDER_SITE_OTHER): Payer: PPO | Admitting: Family Medicine

## 2023-03-22 VITALS — BP 110/60 | HR 78 | Temp 96.8°F

## 2023-03-22 DIAGNOSIS — I10 Essential (primary) hypertension: Secondary | ICD-10-CM | POA: Diagnosis not present

## 2023-03-22 DIAGNOSIS — I2510 Atherosclerotic heart disease of native coronary artery without angina pectoris: Secondary | ICD-10-CM

## 2023-03-22 DIAGNOSIS — G8929 Other chronic pain: Secondary | ICD-10-CM

## 2023-03-22 DIAGNOSIS — E785 Hyperlipidemia, unspecified: Secondary | ICD-10-CM | POA: Diagnosis not present

## 2023-03-22 DIAGNOSIS — K5792 Diverticulitis of intestine, part unspecified, without perforation or abscess without bleeding: Secondary | ICD-10-CM | POA: Diagnosis not present

## 2023-03-22 NOTE — Progress Notes (Signed)
Phone [phone] In person visit   Subjective:   Joshua [name] is a 82 y.o. year old very pleasant male patient who presents for/with See problem oriented charting Chief Complaint  Patient presents with   Dizziness    Started yesterday feels like room is spinning    Diverticulitis    Recent hopsitalization   Past Medical History-  Patient Active Problem List   Diagnosis Date Noted   Scrotal pain 02/11/2023    Priority: High   Coronary atherosclerosis 10/12/2006    Priority: High   Overactive bladder 11/24/2019    Priority: Medium    Hyperglycemia 10/07/2017    Priority: Medium    Gout 11/20/2016    Priority: Medium    CKD (chronic kidney disease), stage III (HCC) 10/23/2013    Priority: Medium    Neuropathy (HCC) 08/21/2013    Priority: Medium    History of bladder cancer 04/01/2010    Priority: Medium    Essential hypertension 02/21/2007    Priority: Medium    Hyperlipidemia 10/12/2006    Priority: Medium    Asthma 10/12/2006    Priority: Medium    Aortic atherosclerosis (HCC) 05/05/2018    Priority: Low   Senile purpura (HCC) 10/07/2017    Priority: Low   DJD (degenerative joint disease)     Priority: Low   Arthritis     Priority: Low   Neck pain 02/08/2017    Priority: Low   Venous insufficiency 12/29/2016    Priority: Low   Ganglion cyst of tendon sheath of right hand 12/29/2016    Priority: Low   Obesity, Class I, BMI 30-34.9 10/03/2015    Priority: Low   Allergic rhinitis 05/31/2014    Priority: Low   Fatigue 11/09/2013    Priority: Low   Dizziness 08/23/2013    Priority: Low   Chronic bilateral low back pain with left-sided sciatica 10/09/2011    Priority: Low   Localized osteoarthrosis, lower leg 10/04/2009    Priority: Low   Tinnitus 05/05/2007    Priority: Low   OSA (obstructive sleep apnea) 11/02/2006    Priority: Low   GERD 10/12/2006    Priority: Low   De Quervain's tenosynovitis, right 10/28/2022    Priority: 1.    Degenerative arthritis of right knee 06/08/2022    Priority: 1.   Left hamstring injury 04/21/2022    Priority: 1.   Degenerative cervical disc 01/12/2022    Priority: 1.   Greater trochanteric bursitis of right hip 02/17/2021    Priority: 1.   Primary osteoarthritis of left knee 06/18/2020    Priority: 1.   Trigger thumb, left thumb 01/24/2020    Priority: 1.   Greater trochanteric bursitis of left hip 10/27/2018    Priority: 1.   Degenerative disc disease, lumbar 10/27/2018    Priority: 1.   Abdominal muscle strain, initial encounter 06/16/2018    Priority: 1.   Weakness of left leg 06/21/2017    Priority: 1.   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     albuterol (PROAIR HFA) 108 (90 Base) MCG/ACT inhaler Inhale 1-2 puffs into the lungs every 6 (six) hours as needed for wheezing or shortness of breath. 18 g 5   Alirocumab (PRALUENT) 150 MG/ML SOAJ INJECT 1 DOSE INTO THE SKIN EVERY 14 (FOURTEEN) DAYS. 6 mL 3   amLODipine (NORVASC) 5 MG tablet TAKE 1 TABLET (5 MG TOTAL) BY MOUTH DAILY. 90 tablet 3   amoxicillin-clavulanate (AUGMENTIN) 875-125 MG tablet Take 1 tablet by mouth 2 (two) times daily for 7 days. 14 tablet 0   aspirin EC 81 MG tablet Take 1 tablet (81 mg total) by mouth 2 (two) times daily after a meal. 30 tablet 11   cholecalciferol (VITAMIN D3) 25 MCG (1000 UNIT) tablet Take 1,000 Units by mouth daily.     Cyanocobalamin (B-12)  2500 MCG TABS Take 1,250 mcg by mouth daily.     diazepam (VALIUM) 5 MG tablet TAKE 1 TABLET (5 MG TOTAL) BY MOUTH AT BEDTIME AS NEEDED (SLEEP). 30 tablet 0   EPINEPHrine 0.3 mg/0.3 mL IJ SOAJ injection Inject 0.3 mg into the muscle as needed for anaphylaxis. for allergic reaction     ezetimibe (ZETIA) 10 MG tablet TAKE 1 TABLET BY MOUTH EVERY DAY 90 tablet 2   fluticasone (FLONASE) 50 MCG/ACT nasal spray Place 1 spray into both nostrils daily.     Fluticasone Furoate (ARNUITY ELLIPTA) 100 MCG/ACT AEPB Inhale 1 Inhalation into the lungs daily.     folic acid (FOLVITE) 800 MCG tablet Take 800 mcg by mouth daily.      gabapentin (NEURONTIN) 300 MG capsule Take 2 cap in AM, 1 cap at noon, 2caps at bedtime 450 capsule 3   Glucos-Chond-Hyal Ac-Ca Fructo (MOVE FREE JOINT HEALTH ADVANCE PO) Take 1 tablet by mouth daily.     hydrochlorothiazide (HYDRODIURIL) 25 MG tablet TAKE 1 TABLET BY MOUTH EVERY DAY 90 tablet 2   ketoconazole (NIZORAL) 2 % cream Apply 1 Application topically 2 (two) times daily as needed.     Lidocaine HCl (ASPERCREME LIDOCAINE) 4 % CREA Apply topically in the morning and at bedtime.     lubiprostone (AMITIZA) 8 MCG capsule Take 1 capsule (8 mcg total) by mouth 2 (two) times daily with a meal. Pharmacy, please d/c linzess 145. Not effective 60 capsule 1   mirabegron ER (MYRBETRIQ) 50 MG TB24 tablet Take 1 tablet (50 mg total) by mouth daily. 90 tablet 1   nitroGLYCERIN (NITROSTAT) 0.4 MG SL tablet Place 1 tablet (0.4 mg total) under the tongue every 5 (five) minutes as needed for chest pain. 30 tablet 5   nystatin ointment (MYCOSTATIN) Apply 1 Application topically 2 (two) times daily. 30 g 3   pantoprazole (PROTONIX) 40 MG tablet TAKE 1 TABLET BY MOUTH EVERY DAY 90 tablet 3   polyethylene glycol powder (GLYCOLAX/MIRALAX) powder Take 17 g by mouth daily. (Patient taking differently: Take 17 g by mouth in the morning and at bedtime.) 255 g 0   Probiotic Product (PROBIOTIC PO) Take by  mouth in the morning.     valsartan (DIOVAN) 320 MG tablet TAKE 1 TABLET BY MOUTH EVERY DAY 90 tablet 3   Wheat Dextrin (BENEFIBER) POWD Take 1 Dose by mouth daily. 1 dose = 2 teaspoons     methylPREDNISolone (MEDROL DOSEPAK) 4 MG TBPK tablet Taper Per Instructions 21 tablet 0   No current facility-administered medications for this visit.     Objective:  BP 110/60 (  BP Location: Left Arm, Patient Position: Sitting, Cuff Size: Normal)   Pulse 78   Temp (!) 96.8 F (36 C)   SpO2 97%  Gen: NAD, resting comfortably CV: RRR no murmurs rubs or gallops Lungs: CTAB no crackles, wheeze, rhonchi Abdomen: soft/nontender except for moderate pain in left lower quadrant/nondistended/normal bowel sounds. No rebound or guarding.  Ext: trace edema Skin: warm, dry Neuro: grossly normal, moves all extremities     Assessment and Plan   # Diverticulitis S: Patient went to emergency room on 03/18/2023 presenting with worsening x 2 days lower abdominal pain at leats.  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  Baseline scrotum pain which has been a long-term issue  CT scan showed uncomplicated diverticulitis.  Had very mild leukocytosis but otherwise reassuring blood work.  He was treated with Augmentin, Tylenol for pain control and given p.o. challenge.  He was given option of admission with plan for close outpatient follow-up.   He has started to feel better at home- was able to have cheerios and toast with jelly at lunch. Has had 2 episodes of diarrhea this morning - normal size bowel movement. No blood in stool . 2 small bowel movement yesterday- slightly loose. He has held miralax but still taking lubiprostone. No fevers. Some nausea last night with vertigo. yesterday started with vertigo- more mild than yesterday.  Typical of prior episodes over last  few years and improving already- declines further evaluation/rehab. Has had ongoing fatigue. Pain 1/10 down 8/10 previously. May try eggs and toast tonight.   No facial or extremity weakness. No slurred words or trouble swallowing. no blurry vision or double vision. No new paresthesias. No confusion or word finding difficulties. Some chills.   A/P: Diverticulitis appears to be improving with Augmentin-patient plans to finish course.  He would like to advance his diet but I encouraged him to do this more gradually - In the last 24 hours he has developed some vertigo.  Reassuring neurological exam today.  We did not attempt Epley maneuver.  He actually reports somewhat better with head movement but has a low level feeling of motion but drastically improved from yesterday-he prefers to hold off on further intervention or workup or referral to vestibular rehab unless symptoms fail to continue to improve-reports has had somewhat similar symptoms in the past.  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 but also sounds like has had vertigo more along the lines of this as well -Did have a mildly elevated white blood count and offered to recheck this today but he wants to hold off on blood work and consider doing that at follow-up visit on the seventh which was already scheduled -If he has new or worsening symptoms in regards to abdominal pain or vertigo or other new symptoms he agrees to reach out immediately -He is experiencing fatigue and we discussed checking TSH at follow-up-also reports mild weight gain which could go with an overactive thyroid  # Bradycardia S: On monitoring in the hospital apparently noted some bradycardia with heart rate as low as 40 but by time of physician evaluation was in the 80s.  He is asymptomatic. -also noted heart rate down to 46 this morning- asymptomatic- feels just as fatigued with or without it.  A/P: Normal heart rate today-discussed possible cardiac monitoring but he  prefers to hold  off on this for now and discuss with cardiology at next visit if persistent     #CAD/hyperlipidemia-history of CABG x5- follows with Dr. Antoine Poche S: Meds: asa 81mg , praluent -Fatigue on Repatha in 2021-restarted on Zetia 10 mg p.o. as well but later placed back on praluent 150 mg q2 weeks -Stent drug eluting placed 04/13/19 Dr. Clifton James  Lab Results  Component Value Date   CHOL 115 06/29/2022   HDL 42 06/29/2022   LDLCALC 46 06/29/2022   LDLDIRECT 115.0 06/04/2016   TRIG 159 (H) 06/29/2022   CHOLHDL 2.7 06/29/2022   A/P: CAD asymptomatic-continue current medication -Lipids have been well-controlled but not soon enough for repeat   #Hypertension/CKD stage III S: Compliant with amlodipine 5 mg, hydrochlorothiazide 25 mg and valsartan 320mg  A/P: Blood pressure well-controlled even with recent illness-continue to monitor  # Prediabetes-check A1c next visit  # B12 deficiency-well-controlled on last check-consider repeat at follow-up.  Still takes oral B12  Lab Results  Component Value Date   VITAMINB12 850 03/25/2022     #Hyperglycemia history of mildly elevated A1c.  Prediabetes range.   Recommended follow up: Return for next already scheduled visit or sooner if needed. Future Appointments  Date Time Provider Department Center  03/29/2023  3:40 PM [facility] 1 [facility]  03/30/2023 10:20 AM Shelva Majestic, MD [facility]  04/05/2023  8:30 AM [facility] 1 [facility]  04/08/2023 10:00 AM Eckard, Tammy, R[facility] None  04/28/2023  8:30 AM Van Clines, MD [facility] None  05/12/2023 10:00 AM Judi Saa, DO [facility] None  05/19/2023 10:00 AM Legrand Como, PA-C [facility]  06/10/2023 10:00 AM [facility] ANNUAL WELLNESS VISIT 1 [facility]   Lab/Order associations:   ICD-10-CM   1. Diverticulitis  K57.92     2. Essential hypertension  I10     3. Hyperlipidemia, unspecified hyperlipidemia type  E78.5     4.  Atherosclerosis of native coronary artery of native heart without angina pectoris  I25.10      No orders of the defined types were placed in this encounter.  Return precautions advised.  Tana Conch, MD

## 2023-03-22 NOTE — Patient Instructions (Addendum)
You wanted to hold off on further workup on vertigo for now- but if fails to continue to improve or worsens you agreed to let us know.   Finish antibiotics- let us know if symptoms fail to continue to improve.   Recommended follow up: Return for next already scheduled visit or sooner if needed.

## 2023-03-22 NOTE — Telephone Encounter (Signed)
Noted  Will see him this afternoon

## 2023-03-25 DIAGNOSIS — N35013 Post-traumatic anterior urethral stricture: Secondary | ICD-10-CM | POA: Diagnosis not present

## 2023-03-25 DIAGNOSIS — N3946 Mixed incontinence: Secondary | ICD-10-CM | POA: Diagnosis not present

## 2023-03-25 DIAGNOSIS — N5082 Scrotal pain: Secondary | ICD-10-CM | POA: Diagnosis not present

## 2023-03-26 ENCOUNTER — Telehealth: Payer: Self-pay | Admitting: Family Medicine

## 2023-03-26 NOTE — Telephone Encounter (Signed)
 Patient called stating that Dr Katrinka Blazing was going to talk to Dr Lorrine Kin to see if there was anything else he could do for his back?  Please advise.

## 2023-03-29 ENCOUNTER — Ambulatory Visit
Admission: RE | Admit: 2023-03-29 | Discharge: 2023-03-29 | Disposition: A | Discharge status: Home / Self Care | Payer: PPO | Source: Ambulatory Visit | Attending: Family Medicine | Admitting: Family Medicine

## 2023-03-29 DIAGNOSIS — N5082 Scrotal pain: Secondary | ICD-10-CM

## 2023-03-29 DIAGNOSIS — I861 Scrotal varices: Secondary | ICD-10-CM

## 2023-03-29 DIAGNOSIS — N3289 Other specified disorders of bladder: Secondary | ICD-10-CM | POA: Diagnosis not present

## 2023-03-29 DIAGNOSIS — K4090 Unilateral inguinal hernia, without obstruction or gangrene, not specified as recurrent: Secondary | ICD-10-CM | POA: Diagnosis not present

## 2023-03-29 DIAGNOSIS — N433 Hydrocele, unspecified: Secondary | ICD-10-CM | POA: Diagnosis not present

## 2023-03-29 MED ORDER — GADOPICLENOL 0.5 MMOL/ML IV SOLN
9.0000 mL | Freq: Once | INTRAVENOUS | Status: AC | PRN
  Administered 2023-03-29: 9 mL via INTRAVENOUS

## 2023-03-30 ENCOUNTER — Ambulatory Visit: Payer: PPO | Admitting: Family Medicine

## 2023-03-30 NOTE — Telephone Encounter (Signed)
 Sent patient MyChart message.

## 2023-04-01 ENCOUNTER — Other Ambulatory Visit: Payer: Self-pay | Admitting: Gastroenterology

## 2023-04-04 ENCOUNTER — Encounter: Payer: Self-pay | Admitting: Family Medicine

## 2023-04-04 NOTE — Progress Notes (Signed)
 Referring Physician(s): Corey,Evan S   Chief Complaint: The patient is seen in virtual telephone follow up today s/p left geniculate artery embolization 03/08/23  History of present illness: HPI from initial consultation 02/05/23 Joshua [name] is a 83 y.o. male with a medical history significant for CAD (s/p CABG), gout, HTN, bladder cancer, prostate cancer, obesity, PVD, low back pain and arthritis. He is s/p left total knee arthroplasty in 2022 but continues to have left knee pain as well as pain in the right knee. He was evaluated by Dr. Joane in October for right hip pain and right knee pain. He has had prior right knee injections with PRP and Monovisc which were ineffective.  He recently had a geniculate block to the left knee and has been referred to Interventional Radiology for possible embolization.   He presents to the outpatient clinic today for further discussion of geniculate artery embolization.  He has never had significant pain relief in the left knee since TKA.  This has progressed over the past several months.  He used to still be able to exercise at the gym but is no longer able to.  He takes tylenol  for his pain, which only helps a little.  He has considered nerve ablation to the left knee.  His right knee bothers him, but not nearly as much as his left.     Womac Pain Score = 57/96 VAS Pain Score = 7/10  We discussed geniculate artery embolization for the left knee with possible treatment of the right knee at a later date.  We discussed the rationale, risks, benefits, and periprocedural expectations regarding GAE of the left knee and he wished to proceed. The procedure was performed 03/08/23 and he tolerated the procedure well. He presents today for follow up via virtual tele-health visit.   No longer thinking about it constantly as he was prior to the procedure.  Still having some pain.  He states he can do almost anything he wants with it, without pain.  Not taking any  pain medication.  He reports that the procedure itself was painless.  He is now able to drive to [facility] to see his brother, which he was unable to do with his prior pain.  He states his average knee pain now as 2/10.  WOMAC questionnaire was not performed today.  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   CAD (coronary artery disease)    CABG 2001   Diverticulosis    DJD (degenerative joint disease)    BACK   GERD (gastroesophageal reflux disease)    H/O inguinal hernia repair 12/2018   History of gout    Hyperlipidemia    Hypertension    IBS (irritable bowel syndrome)    Obesity    OSA (obstructive sleep apnea)    USES CPAP    Pancreatitis ~ 1980   Peripheral vascular disease (HCC)    Pre-diabetes    Prostate cancer (HCC) 04/2010    Past Surgical History:  Procedure Laterality Date   ANTERIOR CERVICAL DECOMP/DISCECTOMY FUSION  05/26/2006   PLATE PLACED   BACK SURGERY     CARDIAC CATHETERIZATION  11/20/1999   CORONARY ARTERY BYPASS GRAFT  11/20/1999   SVG-RI1-RI2, SVG-OM, SVG-dRCA   CORONARY STENT  INTERVENTION N/A 04/13/2019   Procedure: CORONARY STENT INTERVENTION;  Surgeon: Verlin Lonni BIRCH, MD;  Location: [facility];  Service: Cardiovascular;  Laterality: N/A;   HERNIA REPAIR     INGUINAL HERNIA REPAIR Left 01/02/2019   Procedure: OPEN LEFT INGUINAL HERNIA REPAIR WITH MESH;  Surgeon: Gladis Cough, MD;  Location: [facility];  Service: General;  Laterality: Left;   IR EMBO ARTERIAL NOT HEMORR HEMANG INC GUIDE ROADMAPPING  03/08/2023   IR RADIOLOGIST EVAL & MGMT  02/05/2023   KNEE ARTHROSCOPY Right 1992; 04/2008   LAPAROSCOPIC CHOLECYSTECTOMY  2007   LEFT HEART CATH AND CORS/GRAFTS ANGIOGRAPHY N/A 04/13/2019   Procedure: LEFT HEART CATH AND CORS/GRAFTS  ANGIOGRAPHY;  Surgeon: Verlin Lonni BIRCH, MD;  Location: [facility];  Service: Cardiovascular;  Laterality: N/A;   NASAL SEPTUM SURGERY Right 2007   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   ROBOT ASSISTED LAPAROSCOPIC RADICAL PROSTATECTOMY  04/2010   THYROIDECTOMY, PARTIAL  1980   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   Procedure: LEFT TOTAL KNEE ARTHROPLASTY;  Surgeon: Sheril Coy, MD;  Location: [facility];  Service: Orthopedics;  Laterality: Left;    Allergies: Codeine, Lexapro  [escitalopram ], Bromfed, Clarithromycin, Cymbalta  [duloxetine  hcl], Doxycycline hyclate, Modafinil, Oxycodone -acetaminophen , Promethazine hcl, Quinolones, Telithromycin, Tramadol , Benadryl [diphenhydramine], Hydrocodone -acetaminophen , Repatha  [evolocumab ], Statins, and Sulfonamide derivatives  Medications: Prior to Admission medications   Medication Sig Start Date End Date Taking? Authorizing Provider  acetaminophen  (TYLENOL ) 500 MG tablet Take 1,000 mg by mouth every 6 (six) hours as needed for moderate pain or headache.    [provider]  albuterol  (PROAIR  HFA) 108 (90 Base) MCG/ACT inhaler Inhale 1-2 puffs into the lungs every 6 (six) hours as needed for wheezing or shortness of breath. 12/06/21   Lucius Krabbe, NP  Alirocumab  (PRALUENT ) 150 MG/ML SOAJ INJECT 1 DOSE INTO THE SKIN EVERY 14 (FOURTEEN) DAYS. 12/08/22   Hilty, Vinie BROCKS, MD  amLODipine  (NORVASC ) 5 MG tablet TAKE 1 TABLET (5 MG TOTAL) BY MOUTH DAILY. 02/03/23   Mona Vinie BROCKS, MD  aspirin  EC 81 MG tablet Take 1 tablet (81 mg total) by mouth 2 (two) times daily after a meal. 06/18/20   Lenis Barter, PA-C  cholecalciferol (VITAMIN D3) 25 MCG (1000 UNIT) tablet Take 1,000 Units by mouth daily.    [provider]  Cyanocobalamin  (B-12) 2500 MCG TABS Take 1,250 mcg by mouth daily.    [provider]  diazepam  (VALIUM ) 5 MG tablet TAKE 1 TABLET (5 MG TOTAL) BY MOUTH AT BEDTIME AS NEEDED (SLEEP). 10/22/22   Katrinka Garnette KIDD, MD  EPINEPHrine  0.3 mg/0.3 mL IJ SOAJ injection Inject 0.3 mg into the muscle as needed for anaphylaxis. for allergic reaction 04/24/19   [provider]  ezetimibe  (ZETIA ) 10 MG tablet TAKE 1 TABLET BY MOUTH EVERY DAY 02/03/23   Lavona Agent, MD  fluticasone  (FLONASE ) 50 MCG/ACT nasal spray Place 1 spray into both nostrils daily. 09/05/12   Mavis Norleen BRAVO, MD  Fluticasone  Furoate (ARNUITY ELLIPTA ) 100 MCG/ACT AEPB Inhale 1 Inhalation into the lungs daily.    [provider]  folic acid  (FOLVITE ) 800 MCG tablet Take 800 mcg by mouth daily.     [provider]  gabapentin  (NEURONTIN ) 300 MG capsule Take 2 cap in AM, 1 cap at noon, 2caps at bedtime 04/27/22   Georjean Darice HERO, MD  Glucos-Chond-Hyal Ac-Ca Fructo (MOVE FREE JOINT HEALTH ADVANCE PO) Take 1 tablet by mouth daily.    [provider]  hydrochlorothiazide  (HYDRODIURIL )  25 MG tablet TAKE 1 TABLET BY MOUTH EVERY DAY 02/02/23   Katrinka Garnette KIDD, MD  ketoconazole (NIZORAL) 2 % cream Apply 1 Application topically 2 (two) times daily as needed. 01/25/23   [provider]  Lidocaine  HCl (ASPERCREME LIDOCAINE ) 4 % CREA Apply topically in the morning and at bedtime.    [provider]  lubiprostone  (AMITIZA ) 8 MCG capsule TAKE 1 CAPSULE BY MOUTH 2 TIMES DAILY WITH A MEAL. 04/01/23   McMichael, Bayley M, PA-C  methylPREDNISolone  (MEDROL  DOSEPAK) 4 MG TBPK tablet Taper Per Instructions 03/05/23   Holliday Sheaffer, Ester PARAS, MD  mirabegron  ER (MYRBETRIQ ) 50 MG TB24 tablet Take 1 tablet (50 mg total) by mouth daily. 10/01/22   Katrinka Garnette KIDD, MD  nitroGLYCERIN  (NITROSTAT ) 0.4 MG SL tablet Place 1 tablet (0.4 mg total) under the tongue every 5 (five) minutes as needed for chest pain. 12/11/22   Katrinka Garnette KIDD, MD  nystatin  ointment (MYCOSTATIN ) Apply 1 Application topically 2 (two) times daily. 08/05/22   Jesus Bernardino MATSU, MD  pantoprazole  (PROTONIX ) 40 MG tablet TAKE 1 TABLET BY MOUTH EVERY DAY 07/08/22    Hilty, Vinie BROCKS, MD  polyethylene glycol powder (GLYCOLAX /MIRALAX ) powder Take 17 g by mouth daily. Patient taking differently: Take 17 g by mouth in the morning and at bedtime. 08/29/14   Lynwood Anes, MD  Probiotic Product (PROBIOTIC PO) Take by mouth in the morning.    [provider]  valsartan  (DIOVAN ) 320 MG tablet TAKE 1 TABLET BY MOUTH EVERY DAY 04/24/22   Katrinka Garnette KIDD, MD  Wheat Dextrin (BENEFIBER) POWD Take 1 Dose by mouth daily. 1 dose = 2 teaspoons    [provider]     Family History  Problem Relation Age of Onset   Heart disease Mother    Hyperlipidemia Mother    Heart disease Father    Hyperlipidemia Father    Diabetes Brother    Colon cancer Neg Hx    Pancreatic cancer Neg Hx    Esophageal cancer Neg Hx    Stomach cancer Neg Hx    Liver cancer Neg Hx     Social History   Socioeconomic History   Marital status: Married    Spouse name: Not on file   Number of children: Not on file   Years of education: Not on file   Highest education level: Associate degree: academic program  Occupational History   Occupation: retired    Associate Professor: RETIRED  Tobacco Use   Smoking status: Former    Current packs/day: 0.00    Average packs/day: 1 pack/day for 35.0 years (35.0 ttl pk-yrs)    Types: Cigarettes    Start date: 06/16/1957    Quit date: 06/16/1992    Years since quitting: 30.8   Smokeless tobacco: Never  Vaping Use   Vaping status: Never Used  Substance and Sexual Activity   Alcohol use: Yes    Alcohol/week: 2.0 standard drinks of alcohol    Types: 2 Shots of liquor per week    Comment: occ   Drug use: No   Sexual activity: Not Currently    Partners: Female  Other Topics Concern   Not on file  Social History Narrative   Married 42 years in 2015. 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      Hobbies: poker, movies and tv, staying active  Right handed   2 level  home with steps he uses   Social Drivers of Corporate Investment Banker Strain: Low Risk  (02/09/2023)   Overall Financial Resource Strain (CARDIA)    Difficulty of Paying Living Expenses: Not hard at all  Food Insecurity: No Food Insecurity (02/09/2023)   Hunger Vital Sign    Worried About Running Out of Food in the Last Year: Never true    Ran Out of Food in the Last Year: Never true  Transportation Needs: No Transportation Needs (02/09/2023)   PRAPARE - Administrator, Civil Service (Medical): No    Lack of Transportation (Non-Medical): No  Physical Activity: Inactive (02/09/2023)   Exercise Vital Sign    Days of Exercise per Week: 0 days    Minutes of Exercise per Session: 10 min  Stress: Stress Concern Present (02/09/2023)   [employer] of Occupational Health - Occupational Stress Questionnaire    Feeling of Stress : To some extent  Social Connections: Moderately Integrated (02/09/2023)   Social Connection and Isolation Panel [NHANES]    Frequency of Communication with Friends and Family: More than three times a week    Frequency of Social Gatherings with Friends and Family: Once a week    Attends Religious Services: Never    Database Administrator or Organizations: Yes    Attends Engineer, Structural: More than 4 times per year    Marital Status: Married     Vital Signs: There were no vitals taken for this visit.  No physical exam was performed in lieu of virtual telephone visit.    Imaging: Right knee (10/25/22)  Kellgren and Lawrence Grade 2   Left knee (02/17/21)    Left GAE 03/18/23   Labs:  CBC: Recent Labs    09/02/22 0955 11/19/22 0955 12/22/22 1451 03/18/23 0725  WBC 7.5 8.7 7.5 11.6*  HGB 13.8 15.0 14.5 13.9  HCT 41.0 41.3 43.2 39.6  PLT 225.0 203 260.0 181    COAGS: No results for input(s): INR, APTT in the last 8760 hours.  BMP: Recent Labs    09/18/22 0835 11/19/22 1104 12/22/22 1451 03/18/23 0725   NA 136 131* 137 134*  K 3.8 4.2 4.2 3.5  CL 102 99 100 99  CO2 27 25 32 24  GLUCOSE 114* 92 110* 154*  BUN 22 21 20 16   CALCIUM  9.6 9.4 9.7 9.0  CREATININE 1.40 1.12 1.35 1.31*  GFRNONAA  --  >60  --  55*    LIVER FUNCTION TESTS: Recent Labs    09/18/22 0835 11/19/22 1104 12/22/22 1451 03/18/23 0725  BILITOT 0.6 0.8 0.7 1.2*  AST 17 19 17 20   ALT 22 24 18 21   ALKPHOS 51 54 60 48  PROT 6.5 6.4* 6.7 6.2*  ALBUMIN 3.9 4.0 4.0 3.2*    Assessment and Plan: 83 year old male with a history of bilateral knee osteoarthritis status post left total knee arthoplasty in 2022 with persistent, worsening pain since the operation [facility] 57/96).  He has attempted conservative measures for pain relief without any benefit.  He also has some moderate right knee pain secondary to osteoarthritis (K&G 2). He underwent a technically successful left geniculate artery embolization 03/08/23. He reports excellent results and is very please, with his VAS pain score decreasing from 7/10 --> 2/10.  He has improved mobility and quality of life after the procedure.  Plan for follow up in IR clinic in 6 months.  Ester Sides,  MD Pager: [phone]    I spent a total of 25 Minutes in virtual telephone clinical consultation, greater than 50% of which was counseling/coordinating care for bilateral knee pain.

## 2023-04-05 ENCOUNTER — Ambulatory Visit
Admission: RE | Admit: 2023-04-05 | Discharge: 2023-04-05 | Disposition: A | Discharge status: Home / Self Care | Payer: PPO | Source: Ambulatory Visit | Attending: Interventional Radiology | Admitting: Interventional Radiology

## 2023-04-05 DIAGNOSIS — M25561 Pain in right knee: Secondary | ICD-10-CM | POA: Diagnosis not present

## 2023-04-05 DIAGNOSIS — G8929 Other chronic pain: Secondary | ICD-10-CM

## 2023-04-05 DIAGNOSIS — M25562 Pain in left knee: Secondary | ICD-10-CM | POA: Diagnosis not present

## 2023-04-05 HISTORY — PX: IR RADIOLOGIST EVAL & MGMT: IMG5224

## 2023-04-06 ENCOUNTER — Telehealth: Payer: Self-pay | Admitting: Family Medicine

## 2023-04-06 ENCOUNTER — Other Ambulatory Visit: Payer: Self-pay

## 2023-04-06 DIAGNOSIS — G8929 Other chronic pain: Secondary | ICD-10-CM

## 2023-04-06 NOTE — Telephone Encounter (Signed)
 Patient called stating that he reached out to Dr Lorrine Kin but they would not talk to him without a referral.  Can this be sent for him?

## 2023-04-07 DIAGNOSIS — G4733 Obstructive sleep apnea (adult) (pediatric): Secondary | ICD-10-CM | POA: Diagnosis not present

## 2023-04-08 ENCOUNTER — Other Ambulatory Visit: Payer: Self-pay | Admitting: Interventional Radiology

## 2023-04-08 ENCOUNTER — Other Ambulatory Visit: Payer: Self-pay | Admitting: Pharmacist

## 2023-04-08 ENCOUNTER — Encounter: Payer: Self-pay | Admitting: Family Medicine

## 2023-04-08 DIAGNOSIS — M1712 Unilateral primary osteoarthritis, left knee: Secondary | ICD-10-CM

## 2023-04-08 NOTE — Progress Notes (Addendum)
Referring Physician(s):  Corey,Evan S   Chief Complaint: The patient is seen in follow up today s/p left geniculate artery embolization 03/08/23   History of present illness: HPI from last clinic visit 04/05/23 Joshua [name] is a 83 y.o. male with a medical history significant for CAD (s/p CABG), gout, HTN, bladder cancer, prostate cancer, obesity, PVD, low back pain and arthritis. He is s/p left total knee arthroplasty in 2022 but continues to have left knee pain as well as pain in the right knee. He was evaluated by Dr. Denyse Amass in October for right hip pain and right knee pain. He has had prior right knee injections with PRP and Monovisc which were ineffective.  He recently had a geniculate block to the left knee and has been referred to Interventional Radiology for possible embolization.   He presents to the outpatient clinic today for further discussion of geniculate artery embolization.  He has never had significant pain relief in the left knee since TKA.  This has progressed over the past several months.  He used to still be able to exercise at the gym but is no longer able to.  He takes tylenol for his pain, which only helps a little.  He has considered nerve ablation to the left knee.  His right knee bothers him, but not nearly as much as his left.     Womac Pain Score = 57/96 VAS Pain Score = 7/10   We discussed geniculate artery embolization for the left knee with possible treatment of the right knee at a later date.  We discussed the rationale, risks, benefits, and periprocedural expectations regarding GAE of the left knee and he wished to proceed. The procedure was performed 03/08/23 and he tolerated the procedure well. He presents today for follow up via virtual tele-health visit.    No longer thinking about it constantly as he was prior to the procedure.  Still having some pain.  He states he can do almost anything he wants with it, without pain.  Not taking any pain medication.   He reports that the procedure itself was painless.  He is now able to drive to [facility] to see his brother, which he was unable to do with his prior pain.  He states his average knee pain now as 2/10.  WOMAC questionnaire was not performed today.  The patient reported improved mobility and quality of life after the procedure. We discussed following up in 6 months but the patient sustained a fall and has injured his knee. He requested a tele-health visit today.  He states that on Tuesday he tripped on the carpet and home and fell onto his knee.  There was significant medial knee pain which kept him up that night.  The pain has gradually improved.  He has not required pain medication.  There is no bruising.  He is able to bear weight.  He states that today the pain is significantly improved from yesterday.  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"   CAD (coronary artery disease)    CABG 2001   Diverticulosis    DJD (degenerative joint disease)    BACK   GERD (gastroesophageal reflux disease)    H/O inguinal hernia repair 12/2018  History of gout    Hyperlipidemia    Hypertension    IBS (irritable bowel syndrome)    Obesity    OSA (obstructive sleep apnea)    USES CPAP    Pancreatitis ~ 1980   Peripheral vascular disease (HCC)    Pre-diabetes    Prostate cancer (HCC) 04/2010    Past Surgical History:  Procedure Laterality Date   ANTERIOR CERVICAL DECOMP/DISCECTOMY FUSION  05/26/2006   PLATE PLACED   BACK SURGERY     CARDIAC CATHETERIZATION  11/20/1999   CORONARY ARTERY BYPASS GRAFT  11/20/1999   SVG-RI1-RI2, SVG-OM, SVG-dRCA   CORONARY STENT INTERVENTION N/A 04/13/2019   Procedure: CORONARY STENT INTERVENTION;  Surgeon: Kathleene Hazel, MD;  Location: [facility];   Service: Cardiovascular;  Laterality: N/A;   HERNIA REPAIR     INGUINAL HERNIA REPAIR Left 01/02/2019   Procedure: OPEN LEFT INGUINAL HERNIA REPAIR WITH MESH;  Surgeon: Luretha Murphy, MD;  Location: [facility];  Service: General;  Laterality: Left;   IR EMBO ARTERIAL NOT HEMORR HEMANG INC GUIDE ROADMAPPING  03/08/2023   IR RADIOLOGIST EVAL & MGMT  02/05/2023   IR RADIOLOGIST EVAL & MGMT  04/05/2023   KNEE ARTHROSCOPY Right 1992; 04/2008   LAPAROSCOPIC CHOLECYSTECTOMY  2007   LEFT HEART CATH AND CORS/GRAFTS ANGIOGRAPHY N/A 04/13/2019   Procedure: LEFT HEART CATH AND CORS/GRAFTS ANGIOGRAPHY;  Surgeon: Kathleene Hazel, MD;  Location: [facility];  Service: Cardiovascular;  Laterality: N/A;   NASAL SEPTUM SURGERY Right 2007   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   ROBOT ASSISTED LAPAROSCOPIC RADICAL PROSTATECTOMY  04/2010   THYROIDECTOMY, PARTIAL  1980   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   Procedure: LEFT TOTAL KNEE ARTHROPLASTY;  Surgeon: Marcene Corning, MD;  Location: [facility];  Service: Orthopedics;  Laterality: Left;    Allergies: Codeine, Lexapro [escitalopram], Bromfed, Clarithromycin, Cymbalta [duloxetine hcl], Doxycycline hyclate, Modafinil, Oxycodone-acetaminophen, Promethazine hcl, Quinolones, Telithromycin, Tramadol, Benadryl [diphenhydramine], Hydrocodone-acetaminophen, Repatha [evolocumab], Statins, and Sulfonamide derivatives  Medications: Prior to Admission medications   Medication Sig Start Date End Date Taking? Authorizing Provider  acetaminophen (TYLENOL) 500 MG tablet Take 1,000 mg by mouth every 6 (six) hours as needed for moderate pain or headache.    [provider]  albuterol (PROAIR HFA) 108 (90 Base) MCG/ACT inhaler Inhale 1-2 puffs into the lungs every 6 (six) hours as needed for wheezing or shortness of breath. 12/06/21   Dulce Sellar, NP  Alirocumab (PRALUENT) 150 MG/ML SOAJ INJECT 1 DOSE INTO THE SKIN EVERY 14 (FOURTEEN) DAYS. 12/08/22    Hilty, Lisette Abu, MD  amLODipine (NORVASC) 5 MG tablet TAKE 1 TABLET (5 MG TOTAL) BY MOUTH DAILY. 02/03/23   Chrystie Nose, MD  aspirin EC 81 MG tablet Take 1 tablet (81 mg total) by mouth 2 (two) times daily after a meal. 06/18/20   Elodia Florence, PA-C  cholecalciferol (VITAMIN D3) 25 MCG (1000 UNIT) tablet Take 1,000 Units by mouth daily.    [provider]  Cyanocobalamin (B-12) 2500 MCG TABS Take 1,250 mcg by mouth daily.    [provider]  diazepam (VALIUM) 5 MG tablet TAKE 1 TABLET (5 MG TOTAL) BY MOUTH AT BEDTIME AS NEEDED (SLEEP). 10/22/22   Shelva Majestic, MD  EPINEPHrine 0.3 mg/0.3 mL IJ SOAJ injection Inject 0.3 mg into the muscle as needed for anaphylaxis. for allergic reaction 04/24/19   [provider]  ezetimibe (ZETIA) 10 MG tablet TAKE 1 TABLET BY MOUTH EVERY DAY 02/03/23  Rollene Rotunda, MD  fluticasone (FLONASE) 50 MCG/ACT nasal spray Place 1 spray into both nostrils daily. 09/05/12   Stacie Glaze, MD  Fluticasone Furoate (ARNUITY ELLIPTA) 100 MCG/ACT AEPB Inhale 1 Inhalation into the lungs daily.    [provider]  folic acid (FOLVITE) 800 MCG tablet Take 800 mcg by mouth daily.     [provider]  gabapentin (NEURONTIN) 300 MG capsule Take 2 cap in AM, 1 cap at noon, 2caps at bedtime 04/27/22   Van Clines, MD  Glucos-Chond-Hyal Ac-Ca Fructo (MOVE FREE JOINT HEALTH ADVANCE PO) Take 1 tablet by mouth daily.    [provider]  hydrochlorothiazide (HYDRODIURIL) 25 MG tablet TAKE 1 TABLET BY MOUTH EVERY DAY 02/02/23   Shelva Majestic, MD  ketoconazole (NIZORAL) 2 % cream Apply 1 Application topically 2 (two) times daily as needed. 01/25/23   [provider]  Lidocaine HCl (ASPERCREME LIDOCAINE) 4 % CREA Apply topically in the morning and at bedtime.    [provider]  lubiprostone (AMITIZA) 8 MCG capsule TAKE 1 CAPSULE BY MOUTH 2 TIMES DAILY WITH A MEAL. 04/01/23   McMichael, Bayley M, PA-C   methylPREDNISolone (MEDROL DOSEPAK) 4 MG TBPK tablet Taper Per Instructions 03/05/23   Essa Wenk, Thressa Sheller, MD  mirabegron ER (MYRBETRIQ) 50 MG TB24 tablet Take 1 tablet (50 mg total) by mouth daily. 10/01/22   Shelva Majestic, MD  nitroGLYCERIN (NITROSTAT) 0.4 MG SL tablet Place 1 tablet (0.4 mg total) under the tongue every 5 (five) minutes as needed for chest pain. 12/11/22   Shelva Majestic, MD  nystatin ointment (MYCOSTATIN) Apply 1 Application topically 2 (two) times daily. 08/05/22   [name], MD  pantoprazole (PROTONIX) 40 MG tablet TAKE 1 TABLET BY MOUTH EVERY DAY 07/08/22   Hilty, Lisette Abu, MD  polyethylene glycol powder (GLYCOLAX/MIRALAX) powder Take 17 g by mouth daily. Patient taking differently: Take 17 g by mouth in the morning and at bedtime. 08/29/14   Rolland Porter, MD  Probiotic Product (PROBIOTIC PO) Take by mouth in the morning.    [provider]  valsartan (DIOVAN) 320 MG tablet TAKE 1 TABLET BY MOUTH EVERY DAY 04/24/22   Shelva Majestic, MD  Wheat Dextrin (BENEFIBER) POWD Take 1 Dose by mouth daily. 1 dose = 2 teaspoons    [provider]     Family History  Problem Relation Age of Onset   Heart disease Mother    Hyperlipidemia Mother    Heart disease Father    Hyperlipidemia Father    Diabetes Brother    Colon cancer Neg Hx    Pancreatic cancer Neg Hx    Esophageal cancer Neg Hx    Stomach cancer Neg Hx    Liver cancer Neg Hx     Social History   Socioeconomic History   Marital status: Married    Spouse name: Not on file   Number of children: Not on file   Years of education: Not on file   Highest education level: Associate degree: academic program  Occupational History   Occupation: retired    Associate Professor: RETIRED  Tobacco Use   Smoking status: Former    Current packs/day: 0.00    Average packs/day: 1 pack/day for 35.0 years (35.0 ttl pk-yrs)    Types: Cigarettes    Start date: 06/16/1957    Quit date: 06/16/1992    Years since  quitting: 30.8   Smokeless tobacco: Never  Vaping Use  Vaping status: Never Used  Substance and Sexual Activity   Alcohol use: Yes    Alcohol/week: 2.0 standard drinks of alcohol    Types: 2 Shots of liquor per week    Comment: occ   Drug use: No   Sexual activity: Not Currently    Partners: Female  Other Topics Concern   Not on file  Social History Narrative   Married 42 years in 2015. 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      Hobbies: poker, movies and tv, staying active      Right handed   2 level home with steps he uses   Social Drivers of Corporate investment banker Strain: Low Risk  (02/09/2023)   Overall Financial Resource Strain (CARDIA)    Difficulty of Paying Living Expenses: Not hard at all  Food Insecurity: No Food Insecurity (02/09/2023)   Hunger Vital Sign    Worried About Running Out of Food in the Last Year: Never true    Ran Out of Food in the Last Year: Never true  Transportation Needs: No Transportation Needs (02/09/2023)   PRAPARE - Administrator, Civil Service (Medical): No    Lack of Transportation (Non-Medical): No  Physical Activity: Inactive (02/09/2023)   Exercise Vital Sign    Days of Exercise per Week: 0 days    Minutes of Exercise per Session: 10 min  Stress: Stress Concern Present (02/09/2023)   [employer] of Occupational Health - Occupational Stress Questionnaire    Feeling of Stress : To some extent  Social Connections: Moderately Integrated (02/09/2023)   Social Connection and Isolation Panel [NHANES]    Frequency of Communication with Friends and Family: More than three times a week    Frequency of Social Gatherings with Friends and Family: Once a week    Attends Religious Services: Never    Database administrator or Organizations: Yes    Attends Engineer, structural: More than 4 times per year    Marital Status: Married      Vital Signs: There were no vitals taken for this visit.  No physical exam was performed in lieu of virtual telephone visit.    Imaging: Right knee (10/25/22)  Kellgren and Lawrence Grade 2   Left knee (02/17/21)    Left GAE 03/18/23     Labs:  CBC: Recent Labs    09/02/22 0955 11/19/22 0955 12/22/22 1451 03/18/23 0725  WBC 7.5 8.7 7.5 11.6*  HGB 13.8 15.0 14.5 13.9  HCT 41.0 41.3 43.2 39.6  PLT 225.0 203 260.0 181    COAGS: No results for input(s): "INR", "APTT" in the last 8760 hours.  BMP: Recent Labs    09/18/22 0835 11/19/22 1104 12/22/22 1451 03/18/23 0725  NA 136 131* 137 134*  K 3.8 4.2 4.2 3.5  CL 102 99 100 99  CO2 27 25 32 24  GLUCOSE 114* 92 110* 154*  BUN 22 21 20 16   CALCIUM 9.6 9.4 9.7 9.0  CREATININE 1.40 1.12 1.35 1.31*  GFRNONAA  --  >60  --  55*    LIVER FUNCTION TESTS: Recent Labs    09/18/22 0835 11/19/22 1104 12/22/22 1451 03/18/23 0725  BILITOT 0.6 0.8 0.7 1.2*  AST 17 19 17 20   ALT 22 24 18 21   ALKPHOS 51 54 60 48  PROT 6.5 6.4* 6.7 6.2*  ALBUMIN 3.9 4.0 4.0 3.2*    Assessment and Plan:  83 year old male with a history of bilateral knee osteoarthritis status post left total knee arthoplasty in 2022 with persistent, worsening pain since the operation [facility] 57/96).  He has attempted conservative measures for pain relief without any benefit.  He also has some moderate right knee pain secondary to osteoarthritis (K&G 2). He underwent a technically successful left geniculate artery embolization 03/08/23.   He calls today with new left knee pain after a fall on Tuesday of this week.  The pain is improving, I have very low suspicion for fracture or arthroplasty complication given ability to ambulate and significant pain improvement.  If the pain is not improved by next week, would recommend left knee radiographs.  He will call our office in this case to schedule.  Otherwise, follow up in 6 months as determined at clinic  visit earlier this week.  Marliss Coots, MD Pager: [phone]    I spent a total of 25 Minutes in virtual telephone clinical consultation, greater than 50% of which was counseling/coordinating care for bilateral knee pain.

## 2023-04-08 NOTE — Progress Notes (Signed)
04/08/2023 Name: Joshua [name] MRN: [id number] DOB: 05/30/1940  Chief Complaint  Patient presents with   Medication Management   Medication Adherence    Joshua [name] is a 83 y.o. year old male who presented for a telephone visit.   They were referred to the pharmacist by their PCP for assistance in managing medication access and complex medication management.    Subjective:  Patient reports affordability concerns with their medications: Yes  - medications were epensive in 2024, especially when he reached the Medicare coverage gap. We were able to get patient approved for [employer] medication assistance program thru 02/2023 for Arnuity however when the year restarts he has to spend $600 out of pocket before he will be approved for 2025. Patient was sent a 2025 application by our medication assistance team 02/10/2023. Application was received and scanned to chart 03/04/2024. Will just need to send in pharmacy report to [employer] medication assistance program if/when he spends $600 out of pocket.   Patient reports access/transportation concerns to their pharmacy: No  Patient reports adherence concerns with their medications:  No     Asthma: Current medications: Arnuity inhaler - 1 puff into lungs once a day; albuterol HFA inhaler - use 1 or 2 puff as needed for shortness of breath up to every 6 hours.   Medications tried in the past: Flovent - stopped due to cost / discontinued. Ipratropium - stopped 06/2022 by Eileen Stanford.   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  Also recently has had balanitis which has improved with antifungal treatment. Urologist also seemed to feel that moisture with incontinence pads was leading to increase in infections.     Gemtesa changed to generic Myrbetriq due to cost and patient felt [name] was not as effective as  Myrbetriq   Hyperlipidemia/ASCVD Risk Reduction  Current lipid lowering medications:  Praulent 150mg  SQ every 14 days and ezetimibe 10mg  daily Medications tried in the past:  Rosuvastatin 10 and 20mg  mg daily - severe muscle pain, problems standing up and walking Atorvastatin 40mg  daily - severe muscle pain  Simvastatin 20 and 40mg  - myalgias; stopped once medication was discontinued.   Antiplatelet regimen: aspirin 81mg  daily   ASCVD History: yes  Current medication access support: Receives assistance from [employer] Hypercholesterolemia grant for cost of Praluent   Objective:  Lab Results  Component Value Date   HGBA1C 5.9 08/19/2022    Lab Results  Component Value Date   CREATININE 1.31 (H) 03/18/2023   BUN 16 03/18/2023   NA 134 (L) 03/18/2023   K 3.5 03/18/2023   CL 99 03/18/2023   CO2 24 03/18/2023    Lab Results  Component Value Date   CHOL 115 06/29/2022   HDL 42 06/29/2022   LDLCALC 46 06/29/2022   LDLDIRECT 115.0 06/04/2016   TRIG 159 (H) 06/29/2022   CHOLHDL 2.7 06/29/2022    Medications Reviewed Today     Reviewed by Henrene Pastor, RPH-CPP (Pharmacist) on 04/08/23 at 1142  Med List Status: <None>   Medication Order Taking? Sig Documenting Provider Last Dose Status Informant  acetaminophen (TYLENOL) 500 MG tablet [id number] Yes Take 1,000 mg by mouth every 6 (six) hours as needed for moderate pain or headache. [provider] Taking Active Spouse/Significant Other  albuterol (PROAIR HFA) 108 (90 Base) MCG/ACT inhaler [id number] Yes Inhale 1-2 puffs into the lungs every 6 (six)  hours as needed for wheezing or shortness of breath. Dulce Sellar, NP Taking Active   Alirocumab (PRALUENT) 150 MG/ML Ivory Broad [id number] Yes INJECT 1 DOSE INTO THE SKIN EVERY 14 (FOURTEEN) DAYS. Chrystie Nose, MD Taking Active   amLODipine (NORVASC) 5 MG tablet [id number] Yes TAKE 1 TABLET (5 MG TOTAL) BY MOUTH DAILY. Chrystie Nose, MD Taking Active   aspirin EC 81  MG tablet [id number] Yes Take 1 tablet (81 mg total) by mouth 2 (two) times daily after a meal. Elodia Florence, PA-C Taking Active Spouse/Significant Other           Med Note [name], Barth Kirks D   Fri Nov 01, 2020  9:30 AM) Patient is taking only 1 tab/day  cholecalciferol (VITAMIN D3) 25 MCG (1000 UNIT) tablet [id number] Yes Take 1,000 Units by mouth daily. [provider] Taking Active Spouse/Significant Other  Cyanocobalamin (B-12) 2500 MCG TABS [id number] Yes Take 1,250 mcg by mouth daily. [provider] Taking Active Spouse/Significant Other  diazepam (VALIUM) 5 MG tablet [id number] Yes TAKE 1 TABLET (5 MG TOTAL) BY MOUTH AT BEDTIME AS NEEDED (SLEEP). Shelva Majestic, MD Taking Active   EPINEPHrine 0.3 mg/0.3 mL IJ SOAJ injection [id number]  Inject 0.3 mg into the muscle as needed for anaphylaxis. for allergic reaction [provider]  Active            Med Note (DAVIS, SOPHIA A   Wed Jan 29, 2021  9:10 AM) On hand  ezetimibe (ZETIA) 10 MG tablet [id number] Yes TAKE 1 TABLET BY MOUTH EVERY DAY Rollene Rotunda, MD Taking Active   fluticasone (FLONASE) 50 MCG/ACT nasal spray [id number] Yes Place 1 spray into both nostrils daily. Stacie Glaze, MD Taking Active Spouse/Significant Other  Fluticasone Furoate (ARNUITY ELLIPTA) 100 MCG/ACT AEPB [id number] No Inhale 1 Inhalation into the lungs daily.  Patient not taking: Reported on 04/08/2023   [provider] Not Taking Active            Med Note Clydie Braun, Rama Sorci B   Thu Apr 08, 2023 11:31 AM)    folic acid (FOLVITE) 800 MCG tablet [id number] Yes Take 800 mcg by mouth daily.  [provider] Taking Active Spouse/Significant Other           Med Note [name], Dallas Breeding   Fri Nov 01, 2020  9:34 AM) Pt is taking 800 mcg unable to find 400 mcg.  gabapentin (NEURONTIN) 300 MG capsule [id number] Yes Take 2 cap in AM, 1 cap at noon, 2caps at bedtime Van Clines, MD Taking Active   Glucos-Chond-Hyal Ac-Ca Fructo (MOVE FREE  JOINT HEALTH ADVANCE PO) [id number] Yes Take 1 tablet by mouth daily. [provider] Taking Active Spouse/Significant Other  hydrochlorothiazide (HYDRODIURIL) 25 MG tablet [id number] Yes TAKE 1 TABLET BY MOUTH EVERY DAY Shelva Majestic, MD Taking Active   ketoconazole (NIZORAL) 2 % cream [id number] Yes Apply 1 Application topically 2 (two) times daily as needed. [provider] Taking Active   Lidocaine HCl (ASPERCREME LIDOCAINE) 4 % CREA [id number] Yes Apply topically in the morning and at bedtime. [provider] Taking Active   lubiprostone (AMITIZA) 8 MCG capsule [id number] Yes TAKE 1 CAPSULE BY MOUTH 2 TIMES DAILY WITH A MEAL. Boone Master M, PA-C Taking Active   mirabegron ER (MYRBETRIQ) 50 MG TB24 tablet [id number] Yes Take 1 tablet (50 mg total) by mouth daily. Shelva Majestic, MD Taking Active   nitroGLYCERIN (NITROSTAT) 0.4 MG SL tablet [id number] Yes Place 1 tablet (  0.4 mg total) under the tongue every 5 (five) minutes as needed for chest pain. Shelva Majestic, MD Taking Active   nystatin ointment (MYCOSTATIN) [id number] Yes Apply 1 Application topically 2 (two) times daily. [name], MD Taking Active   pantoprazole (PROTONIX) 40 MG tablet [id number] Yes TAKE 1 TABLET BY MOUTH EVERY DAY Hilty, Lisette Abu, MD Taking Active   polyethylene glycol powder (GLYCOLAX/MIRALAX) powder [id number] Yes Take 17 g by mouth daily.  Patient taking differently: Take 17 g by mouth in the morning and at bedtime.   Rolland Porter, MD Taking Active   Probiotic Product (PROBIOTIC PO) [id number] Yes Take by mouth in the morning. [provider] Taking Active   valsartan (DIOVAN) 320 MG tablet [id number] Yes TAKE 1 TABLET BY MOUTH EVERY DAY Shelva Majestic, MD Taking Active   Wheat Dextrin [facility]) POWD [id number] Yes Take 1 Dose by mouth daily. 1 dose = 2 teaspoons [provider] Taking Active Spouse/Significant Other              Assessment/Plan:    Hyperlipidemia/ASCVD Risk Reduction: - Currently controlled. LDL goal is < 70 per Dr Rennis Golden - Recommend to continue Praulent and ezetimibe. Approved fro [employer] thru until 12/22/23.  Asthma: - Currently controlled.  - Recommend to continue Arnuity daily (tier 3 for 2025 - $47 / month) and albuterol HFA as needed.  - Discussed maintenance versus resuce therapy - Meets financial criteria for Arnuity patient assistance program through [employer] MAP. But per [employer] medication assistance program will need to spend $600 out of pocket before he is eligible for their program. (Anticipate this might occur around May or June 2025).    OAB:  - continue follow up with urology - continue Myrbetriq - is tier 3 for 2025 - $47 / month  Medication Management:  - Reviewed 2025 formulary regarding lubiprostone (generic Amitiza)  - tier 4 which would be $100 / month but he is using GoodRx card and  $87 / 90 days. There is an alternative that is tier 3 - Motegrity $47/month if needed in future.    Follow Up Plan: 3 months  Henrene Pastor, PharmD Clinical Pharmacist [facility]  [phone]

## 2023-04-09 ENCOUNTER — Ambulatory Visit
Admission: RE | Admit: 2023-04-09 | Discharge: 2023-04-09 | Disposition: A | Discharge status: Home / Self Care | Payer: PPO | Source: Ambulatory Visit | Attending: Interventional Radiology

## 2023-04-09 ENCOUNTER — Ambulatory Visit: Payer: PPO | Admitting: Family Medicine

## 2023-04-09 DIAGNOSIS — M25561 Pain in right knee: Secondary | ICD-10-CM | POA: Diagnosis not present

## 2023-04-09 DIAGNOSIS — M25562 Pain in left knee: Secondary | ICD-10-CM | POA: Diagnosis not present

## 2023-04-09 DIAGNOSIS — M1712 Unilateral primary osteoarthritis, left knee: Secondary | ICD-10-CM

## 2023-04-09 HISTORY — PX: IR RADIOLOGIST EVAL & MGMT: IMG5224

## 2023-04-10 ENCOUNTER — Other Ambulatory Visit: Payer: Self-pay | Admitting: Internal Medicine

## 2023-04-13 ENCOUNTER — Ambulatory Visit: Payer: PPO | Admitting: Family Medicine

## 2023-04-16 ENCOUNTER — Other Ambulatory Visit: Payer: Self-pay | Admitting: Family Medicine

## 2023-04-16 DIAGNOSIS — N35013 Post-traumatic anterior urethral stricture: Secondary | ICD-10-CM | POA: Diagnosis not present

## 2023-04-16 DIAGNOSIS — N3946 Mixed incontinence: Secondary | ICD-10-CM | POA: Diagnosis not present

## 2023-04-16 DIAGNOSIS — N5082 Scrotal pain: Secondary | ICD-10-CM | POA: Diagnosis not present

## 2023-04-18 ENCOUNTER — Encounter: Payer: Self-pay | Admitting: Pharmacist

## 2023-04-22 ENCOUNTER — Telehealth: Payer: Self-pay

## 2023-04-22 ENCOUNTER — Other Ambulatory Visit (HOSPITAL_COMMUNITY): Payer: Self-pay

## 2023-04-22 NOTE — Telephone Encounter (Signed)
Pharmacy Patient Advocate Encounter   Received notification from CoverMyMeds that prior authorization for PRALUENT is required/requested.   Insurance verification completed.   The patient is insured through [facility] ADVANTAGE/RX ADVANCE .   Per test claim: Refill too soon. PA is not needed at this time. Medication was filled 04/16/23. Next eligible fill date is 05/07/23.

## 2023-04-26 DIAGNOSIS — J3089 Other allergic rhinitis: Secondary | ICD-10-CM | POA: Diagnosis not present

## 2023-04-26 DIAGNOSIS — J301 Allergic rhinitis due to pollen: Secondary | ICD-10-CM | POA: Diagnosis not present

## 2023-04-26 DIAGNOSIS — J4530 Mild persistent asthma, uncomplicated: Secondary | ICD-10-CM | POA: Diagnosis not present

## 2023-04-26 DIAGNOSIS — J3081 Allergic rhinitis due to animal (cat) (dog) hair and dander: Secondary | ICD-10-CM | POA: Diagnosis not present

## 2023-04-27 DIAGNOSIS — M549 Dorsalgia, unspecified: Secondary | ICD-10-CM | POA: Diagnosis not present

## 2023-04-27 DIAGNOSIS — M5416 Radiculopathy, lumbar region: Secondary | ICD-10-CM | POA: Diagnosis not present

## 2023-04-27 DIAGNOSIS — M792 Neuralgia and neuritis, unspecified: Secondary | ICD-10-CM | POA: Diagnosis not present

## 2023-04-27 DIAGNOSIS — G8929 Other chronic pain: Secondary | ICD-10-CM | POA: Diagnosis not present

## 2023-04-28 ENCOUNTER — Encounter: Payer: Self-pay | Admitting: Neurology

## 2023-04-28 ENCOUNTER — Ambulatory Visit: Payer: PPO | Admitting: Neurology

## 2023-04-28 VITALS — BP 102/62 | HR 75 | Ht 66.0 in | Wt 204.2 lb

## 2023-04-28 DIAGNOSIS — G609 Hereditary and idiopathic neuropathy, unspecified: Secondary | ICD-10-CM

## 2023-04-28 MED ORDER — GABAPENTIN 300 MG PO CAPS: ORAL_CAPSULE | ORAL | 3 refills | Status: DC

## 2023-04-28 NOTE — Patient Instructions (Signed)
 Always good to see you. Continue Gabapentin  300mg : Take 2 in AM, 1 at noon, 2 at bedtime. Wishing you all the best. Follow-up in 1 year, call for any changes.
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
## 2023-04-30 ENCOUNTER — Other Ambulatory Visit: Payer: Self-pay | Admitting: Interventional Radiology

## 2023-04-30 ENCOUNTER — Ambulatory Visit
Admission: RE | Admit: 2023-04-30 | Discharge: 2023-04-30 | Disposition: A | Discharge status: Home / Self Care | Payer: PPO | Source: Ambulatory Visit | Attending: Interventional Radiology

## 2023-04-30 DIAGNOSIS — M25562 Pain in left knee: Secondary | ICD-10-CM

## 2023-04-30 DIAGNOSIS — Z96652 Presence of left artificial knee joint: Secondary | ICD-10-CM | POA: Diagnosis not present

## 2023-04-30 HISTORY — PX: IR RADIOLOGIST EVAL & MGMT: IMG5224

## 2023-04-30 NOTE — Progress Notes (Signed)
 Referring Physician(s): Artist Lloyd, MD  Chief Complaint: Left knee pain after fall, hx of left knee GAE  History of present illness: 83 year old male with a history of bilateral knee osteoarthritis status post left total knee arthoplasty in 2022 with persistent, worsening pain since the operation [facility]). He underwent a technically successful left geniculate artery embolization 03/08/23. His pain significantly improved after the procedure.  He fell after tripping on his rug several weeks ago and has had persistent left knee pain since then.  This is a similar pain that prior the GAE.  It started acutely after the fall.  It is mostly located in the anterior, prepatellar knee.  No bruising or swelling reported.  He can still ambulate.   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   CAD (coronary artery disease)    CABG 2001   Diverticulosis    DJD (degenerative joint disease)    BACK   GERD (gastroesophageal reflux disease)    H/O inguinal hernia repair 12/2018   History of gout    Hyperlipidemia    Hypertension    IBS (irritable bowel syndrome)    Obesity    OSA (obstructive sleep apnea)    USES CPAP    Pancreatitis ~ 1980   Peripheral vascular disease (HCC)    Pre-diabetes    Prostate cancer (HCC) 04/2010    Past Surgical History:  Procedure Laterality Date   ANTERIOR CERVICAL DECOMP/DISCECTOMY FUSION  05/26/2006   PLATE PLACED   BACK SURGERY     CARDIAC CATHETERIZATION  11/20/1999   CORONARY ARTERY BYPASS GRAFT  11/20/1999   SVG-RI1-RI2, SVG-OM, SVG-dRCA   CORONARY STENT INTERVENTION N/A 04/13/2019   Procedure: CORONARY STENT INTERVENTION;  Surgeon: Verlin Lonni BIRCH, MD;  Location: [facility];  Service: Cardiovascular;  Laterality: N/A;   HERNIA  REPAIR     INGUINAL HERNIA REPAIR Left 01/02/2019   Procedure: OPEN LEFT INGUINAL HERNIA REPAIR WITH MESH;  Surgeon: Gladis Cough, MD;  Location: [facility];  Service: General;  Laterality: Left;   IR EMBO ARTERIAL NOT HEMORR HEMANG INC GUIDE ROADMAPPING  03/08/2023   IR RADIOLOGIST EVAL & MGMT  02/05/2023   IR RADIOLOGIST EVAL & MGMT  04/05/2023   IR RADIOLOGIST EVAL & MGMT  04/09/2023   KNEE ARTHROSCOPY Right 1992; 04/2008   LAPAROSCOPIC CHOLECYSTECTOMY  2007   LEFT HEART CATH AND CORS/GRAFTS ANGIOGRAPHY N/A 04/13/2019   Procedure: LEFT HEART CATH AND CORS/GRAFTS ANGIOGRAPHY;  Surgeon: Verlin Lonni BIRCH, MD;  Location: [facility];  Service: Cardiovascular;  Laterality: N/A;   NASAL SEPTUM SURGERY Right 2007   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   ROBOT ASSISTED LAPAROSCOPIC RADICAL PROSTATECTOMY  04/2010   THYROIDECTOMY, PARTIAL  1980   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   Procedure: LEFT TOTAL KNEE ARTHROPLASTY;  Surgeon: Sheril Coy, MD;  Location: [facility];  Service: Orthopedics;  Laterality: Left;    Allergies: Codeine, Lexapro  [escitalopram ], Bromfed, Clarithromycin, Cymbalta  [duloxetine  hcl], Doxycycline hyclate, Modafinil, Oxycodone -acetaminophen , Promethazine hcl, Quinolones, Telithromycin, Tramadol , Benadryl [diphenhydramine], Hydrocodone -acetaminophen , Repatha  [evolocumab ], Statins, and Sulfonamide derivatives  Medications: Prior to Admission medications   Medication Sig Start Date End Date Taking? Authorizing Provider  acetaminophen  (TYLENOL ) 500 MG tablet Take 1,000 mg by mouth every 6 (  six) hours as needed for moderate pain or headache.    [provider]  albuterol  (PROAIR  HFA) 108 (90 Base) MCG/ACT inhaler Inhale 1-2 puffs into the lungs every 6 (six) hours as needed for wheezing or shortness of breath. 12/06/21   Lucius Krabbe, NP  Alirocumab  (PRALUENT ) 150 MG/ML SOAJ INJECT 1 DOSE INTO THE SKIN EVERY 14 (FOURTEEN) DAYS. 12/08/22   Hilty, Vinie BROCKS, MD  amLODipine  (NORVASC ) 5 MG tablet TAKE 1 TABLET (5 MG TOTAL) BY MOUTH DAILY. 02/03/23   Mona Vinie BROCKS, MD  aspirin  EC 81 MG tablet Take 1 tablet (81 mg total) by mouth 2 (two) times daily after a meal. 06/18/20   Lenis Barter, PA-C  cholecalciferol (VITAMIN D3) 25 MCG (1000 UNIT) tablet Take 1,000 Units by mouth daily.    [provider]  Cyanocobalamin  (B-12) 2500 MCG TABS Take 1,250 mcg by mouth daily.    [provider]  diazepam  (VALIUM ) 5 MG tablet TAKE 1 TABLET (5 MG TOTAL) BY MOUTH AT BEDTIME AS NEEDED (SLEEP). 04/16/23   Katrinka Garnette KIDD, MD  EPINEPHrine  0.3 mg/0.3 mL IJ SOAJ injection Inject 0.3 mg into the muscle as needed for anaphylaxis. for allergic reaction 04/24/19   [provider]  ezetimibe  (ZETIA ) 10 MG tablet TAKE 1 TABLET BY MOUTH EVERY DAY 02/03/23   Lavona Agent, MD  fluticasone  (FLONASE ) 50 MCG/ACT nasal spray Place 1 spray into both nostrils daily. 09/05/12   Mavis Norleen BRAVO, MD  Fluticasone  Furoate (ARNUITY ELLIPTA ) 100 MCG/ACT AEPB Inhale 1 Inhalation into the lungs daily.    [provider]  folic acid  (FOLVITE ) 800 MCG tablet Take 800 mcg by mouth daily.     [provider]  gabapentin  (NEURONTIN ) 300 MG capsule Take 2 cap in AM, 1 cap at noon, 2caps at bedtime 04/28/23   Georjean Darice HERO, MD  Glucos-Chond-Hyal Ac-Ca Fructo (MOVE FREE JOINT HEALTH ADVANCE PO) Take 1 tablet by mouth daily.    [provider]  hydrochlorothiazide  (HYDRODIURIL ) 25 MG tablet TAKE 1 TABLET BY MOUTH EVERY DAY 02/02/23   Katrinka Garnette KIDD, MD  ketoconazole (NIZORAL) 2 % cream Apply 1 Application topically 2 (two) times daily as needed. 01/25/23   [provider]  Lidocaine  HCl (ASPERCREME LIDOCAINE ) 4 % CREA Apply topically in the morning and at bedtime.    [provider]  lubiprostone  (AMITIZA ) 8 MCG capsule TAKE 1 CAPSULE BY MOUTH 2 TIMES DAILY WITH A MEAL. 04/01/23   McMichael, Bayley M, PA-C  mirabegron  ER  (MYRBETRIQ ) 50 MG TB24 tablet Take 1 tablet (50 mg total) by mouth daily. 10/01/22   Katrinka Garnette KIDD, MD  nitroGLYCERIN  (NITROSTAT ) 0.4 MG SL tablet Place 1 tablet (0.4 mg total) under the tongue every 5 (five) minutes as needed for chest pain. 12/11/22   Katrinka Garnette KIDD, MD  pantoprazole  (PROTONIX ) 40 MG tablet TAKE 1 TABLET BY MOUTH EVERY DAY 04/12/23   Hilty, Vinie BROCKS, MD  polyethylene glycol powder (GLYCOLAX /MIRALAX ) powder Take 17 g by mouth daily. Patient taking differently: Take 17 g by mouth in the morning and at bedtime. 08/29/14   Agent Anes, MD  Probiotic Product (PROBIOTIC PO) Take by mouth in the morning.    [provider]  valsartan  (DIOVAN ) 320 MG tablet TAKE 1 TABLET BY MOUTH EVERY DAY 04/24/22   Katrinka Garnette KIDD, MD     Family History  Problem Relation Age of Onset   Heart disease Mother    Hyperlipidemia Mother  Heart disease Father    Hyperlipidemia Father    Diabetes Brother    Colon cancer Neg Hx    Pancreatic cancer Neg Hx    Esophageal cancer Neg Hx    Stomach cancer Neg Hx    Liver cancer Neg Hx     Social History   Socioeconomic History   Marital status: Married    Spouse name: Not on file   Number of children: Not on file   Years of education: Not on file   Highest education level: Associate degree: academic program  Occupational History   Occupation: retired    Associate Professor: RETIRED  Tobacco Use   Smoking status: Former    Current packs/day: 0.00    Average packs/day: 1 pack/day for 35.0 years (35.0 ttl pk-yrs)    Types: Cigarettes    Start date: 06/16/1957    Quit date: 06/16/1992    Years since quitting: 30.8   Smokeless tobacco: Never  Vaping Use   Vaping status: Never Used  Substance and Sexual Activity   Alcohol use: Yes    Alcohol/week: 2.0 standard drinks of alcohol    Types: 2 Shots of liquor per week    Comment: occ   Drug use: No   Sexual activity: Not Currently    Partners: Female  Other Topics Concern   Not on file   Social History Narrative   Married 42 years in 2015. 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      Hobbies: poker, movies and tv, staying active      Right handed   2 level home with steps he uses   Social Drivers of Corporate Investment Banker Strain: Low Risk  (02/09/2023)   Overall Financial Resource Strain (CARDIA)    Difficulty of Paying Living Expenses: Not hard at all  Food Insecurity: No Food Insecurity (02/09/2023)   Hunger Vital Sign    Worried About Running Out of Food in the Last Year: Never true    Ran Out of Food in the Last Year: Never true  Transportation Needs: No Transportation Needs (02/09/2023)   PRAPARE - Administrator, Civil Service (Medical): No    Lack of Transportation (Non-Medical): No  Physical Activity: Inactive (02/09/2023)   Exercise Vital Sign    Days of Exercise per Week: 0 days    Minutes of Exercise per Session: 10 min  Stress: Stress Concern Present (02/09/2023)   [employer] of Occupational Health - Occupational Stress Questionnaire    Feeling of Stress : To some extent  Social Connections: Moderately Integrated (02/09/2023)   Social Connection and Isolation Panel [NHANES]    Frequency of Communication with Friends and Family: More than three times a week    Frequency of Social Gatherings with Friends and Family: Once a week    Attends Religious Services: Never    Database Administrator or Organizations: Yes    Attends Engineer, Structural: More than 4 times per year    Marital Status: Married     Vital Signs: There were no vitals taken for this visit.  Physical Exam Constitutional:      General: He is not in acute distress. HENT:     Head: Normocephalic.     Mouth/Throat:     Mouth: Mucous membranes are moist.  Eyes:     General: No scleral icterus. Cardiovascular:  Rate and Rhythm: Normal rate.  Pulmonary:     Effort:  Pulmonary effort is normal. No respiratory distress.  Abdominal:     General: There is no distension.  Musculoskeletal:     Right lower leg: No edema.     Left lower leg: No edema.       Legs:     Comments: Tender to palpation.   Skin:    General: Skin is warm.     Findings: No bruising or erythema.  Neurological:     Mental Status: He is alert and oriented to person, place, and time.     Imaging: Left knee 04/30/23   Labs:  CBC: Recent Labs    09/02/22 0955 11/19/22 0955 12/22/22 1451 03/18/23 0725  WBC 7.5 8.7 7.5 11.6*  HGB 13.8 15.0 14.5 13.9  HCT 41.0 41.3 43.2 39.6  PLT 225.0 203 260.0 181    COAGS: No results for input(s): INR, APTT in the last 8760 hours.  BMP: Recent Labs    09/18/22 0835 11/19/22 1104 12/22/22 1451 03/18/23 0725  NA 136 131* 137 134*  K 3.8 4.2 4.2 3.5  CL 102 99 100 99  CO2 27 25 32 24  GLUCOSE 114* 92 110* 154*  BUN 22 21 20 16   CALCIUM  9.6 9.4 9.7 9.0  CREATININE 1.40 1.12 1.35 1.31*  GFRNONAA  --  >60  --  55*    LIVER FUNCTION TESTS: Recent Labs    09/18/22 0835 11/19/22 1104 12/22/22 1451 03/18/23 0725  BILITOT 0.6 0.8 0.7 1.2*  AST 17 19 17 20   ALT 22 24 18 21   ALKPHOS 51 54 60 48  PROT 6.5 6.4* 6.7 6.2*  ALBUMIN 3.9 4.0 4.0 3.2*    Assessment and Plan: 83 year old male with a history of bilateral knee osteoarthritis status post left total knee arthoplasty in 2022 with persistent, worsening pain since the operation [facility] 57/96).  He has attempted conservative measures for pain relief without any benefit.  He also has some moderate right knee pain secondary to osteoarthritis (K&G 2). He underwent a technically successful left geniculate artery embolization 03/08/23.    He presents today for further evaluation of left knee pain after fall.  Radiographs are normal without acute abnormality.  He is able to ambulate and ROM is not significantly. Changed.    Reasurred pt, encouraged conservative measures.   Hopefully this pain will go away and he will return to significant improvement as he experienced after GAE.    Continue plan for 6 month follow up.  Electronically Signed: Ester PARAS Loys Hoselton 04/30/2023, 1:03 PM   I spent a total of 25 Minutes in face to face in clinical consultation, greater than 50% of which was counseling/coordinating care for left knee pain.

## 2023-05-05 NOTE — Progress Notes (Signed)
[name] [address] [place] [address] Phone: [phone] Subjective:   [name], am serving as a scribe for Dr. Antoine Primas.  I'm seeing this patient by the request  of:  Shelva Majestic, MD  CC: Multiple complaints  [id number]  03/15/2023 Unfortunately continues to have back pain that is out of proportion.  Affecting daily activities.  Unable to do repetitive steroids secondary to the side effects.  Discussed which activities to do and which ones to avoid.  Increase activity slowly.  Discussed icing regimen and home exercises, discussed avoiding certain activities.  Has failed everything including physical therapy, has seen other providers and has had injections but secondary to difficulty with steroids he would like to avoid that at the moment.  Due to patient's other comorbidities would like to avoid a large back surgery.  Encouraged him to maybe check again with his PMNR physician to see if he is a candidate for oral spinal stimulator.  Follow-up again in 8 weeks otherwise.  Total time with patient and reviewing the chart 31 minutes      Update 05/12/2023 [name] is a 83 y.o. male coming in with complaint of LBP w L sided sciatica. Patient states saw Dr. Kathlene Cote PA and wasn't impressed. Wants him to get another epidural. Doesn't think it will do any good. B knee pain. R knee hurts more than usual.   Have seen multiple providers for all the problem continues to have discomfort on 9 seems to be more thing else that is giving her trouble.  Continues to have more than neuropathy noted.    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"   CAD (coronary artery disease)    CABG 2001   Diverticulosis    DJD (degenerative joint  disease)    BACK   GERD (gastroesophageal reflux disease)    H/O inguinal hernia repair 12/2018   History of gout    Hyperlipidemia    Hypertension    IBS (irritable bowel syndrome)    Obesity    OSA (obstructive sleep apnea)    USES CPAP    Pancreatitis ~ 1980   Peripheral vascular disease (HCC)    Pre-diabetes    Prostate cancer (HCC) 04/2010   Past Surgical History:  Procedure Laterality Date   ANTERIOR CERVICAL DECOMP/DISCECTOMY FUSION  05/26/2006   PLATE PLACED   BACK SURGERY     CARDIAC CATHETERIZATION  11/20/1999   CORONARY ARTERY BYPASS GRAFT  11/20/1999   SVG-RI1-RI2, SVG-OM, SVG-dRCA   CORONARY STENT INTERVENTION N/A 04/13/2019   Procedure: CORONARY STENT INTERVENTION;  Surgeon: Kathleene Hazel, MD;  Location: [facility];  Service: Cardiovascular;  Laterality: N/A;   HERNIA REPAIR     INGUINAL HERNIA REPAIR Left 01/02/2019   Procedure: OPEN LEFT INGUINAL HERNIA REPAIR WITH MESH;  Surgeon: Luretha Murphy, MD;  Location: [facility];  Service: General;  Laterality: Left;   IR EMBO ARTERIAL NOT HEMORR HEMANG INC GUIDE ROADMAPPING  03/08/2023   IR RADIOLOGIST EVAL & MGMT  02/05/2023   IR RADIOLOGIST EVAL & MGMT  04/05/2023   IR RADIOLOGIST EVAL & MGMT  04/09/2023   IR RADIOLOGIST EVAL & MGMT  04/30/2023   KNEE ARTHROSCOPY  Right 1992; 04/2008   LAPAROSCOPIC CHOLECYSTECTOMY  2007   LEFT HEART CATH AND CORS/GRAFTS ANGIOGRAPHY N/A 04/13/2019   Procedure: LEFT HEART CATH AND CORS/GRAFTS ANGIOGRAPHY;  Surgeon: Kathleene Hazel, MD;  [facility]: [facility];  Service: Cardiovascular;  Laterality: N/A;   NASAL SEPTUM SURGERY Right 2007   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   ROBOT ASSISTED LAPAROSCOPIC RADICAL PROSTATECTOMY  04/2010   THYROIDECTOMY, PARTIAL  1980   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   Procedure: LEFT TOTAL KNEE ARTHROPLASTY;  Surgeon: Marcene Corning, MD;  Location: [facility];  Service: Orthopedics;  Laterality: Left;   Social History    Socioeconomic History   Marital status: Married    Spouse name: Not on file   Number of children: Not on file   Years of education: Not on file   Highest education level: Associate degree: academic program  Occupational History   Occupation: retired    Associate Professor: RETIRED  Tobacco Use   Smoking status: Former    Current packs/day: 0.00    Average packs/day: 1 pack/day for 35.0 years (35.0 ttl pk-yrs)    Types: Cigarettes    Start date: 06/16/1957    Quit date: 06/16/1992    Years since quitting: 30.9   Smokeless tobacco: Never  Vaping Use   Vaping status: Never Used  Substance and Sexual Activity   Alcohol use: Yes    Alcohol/week: 2.0 standard drinks of alcohol    Types: 2 Shots of liquor per week    Comment: occ   Drug use: No   Sexual activity: Not Currently    Partners: Female  Other Topics Concern   Not on file  Social History Narrative   Married 42 years in 2015. 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      Hobbies: poker, movies and tv, staying active      Right handed   2 level home with steps he uses   Social Drivers of Corporate investment banker Strain: Low Risk  (02/09/2023)   Overall Financial Resource Strain (CARDIA)    Difficulty of Paying Living Expenses: Not hard at all  Food Insecurity: No Food Insecurity (02/09/2023)   Hunger Vital Sign    Worried About Running Out of Food in the Last Year: Never true    Ran Out of Food in the Last Year: Never true  Transportation Needs: No Transportation Needs (02/09/2023)   PRAPARE - Administrator, Civil Service (Medical): No    Lack of Transportation (Non-Medical): No  Physical Activity: Inactive (02/09/2023)   Exercise Vital Sign    Days of Exercise per Week: 0 days    Minutes of Exercise per Session: 10 min  Stress: Stress Concern Present (02/09/2023)   [employer] of Occupational Health - Occupational Stress  Questionnaire    Feeling of Stress : To some extent  Social Connections: Moderately Integrated (02/09/2023)   Social Connection and Isolation Panel [NHANES]    Frequency of Communication with Friends and Family: More than three times a week    Frequency of Social Gatherings with Friends and Family: Once a week    Attends Religious Services: Never    Database administrator or Organizations: Yes    Attends Engineer, structural: More than 4 times per year    Marital Status: Married   Allergies  Allergen Reactions  Codeine Other (See Comments)    Trouble breathing   Lexapro [Escitalopram]     Dizziness   Bromfed Nausea And Vomiting   Clarithromycin Other (See Comments)     gastritis   Cymbalta [Duloxetine Hcl]     Abdominal pain and later chest pain as well as worsening fatigue    Doxycycline Hyclate Nausea And Vomiting   Modafinil Other (See Comments)     anxiety-nervousness   Oxycodone-Acetaminophen Itching   Promethazine Hcl Other (See Comments)     fainting   Quinolones Nausea Only    Cipro "felt real bad"   Telithromycin Nausea And Vomiting   Tramadol Nausea Only   Benadryl [Diphenhydramine] Other (See Comments)    Pt told not to take because of bypass surgery   Hydrocodone-Acetaminophen Rash   Repatha [Evolocumab]     Weak, worn out   Statins Other (See Comments)    Leg cramps and makes patient feel bad. Per pt tried multiple in years past   Sulfonamide Derivatives Rash   Family History  Problem Relation Age of Onset   Heart disease Mother    Hyperlipidemia Mother    Heart disease Father    Hyperlipidemia Father    Diabetes Brother    Colon cancer Neg Hx    Pancreatic cancer Neg Hx    Esophageal cancer Neg Hx    Stomach cancer Neg Hx    Liver cancer Neg Hx      Current Outpatient Medications (Cardiovascular):    Alirocumab (PRALUENT) 150 MG/ML SOAJ, INJECT 1 DOSE INTO THE SKIN EVERY 14 (FOURTEEN) DAYS.   amLODipine (NORVASC) 5 MG tablet, TAKE 1  TABLET (5 MG TOTAL) BY MOUTH DAILY.   EPINEPHrine 0.3 mg/0.3 mL IJ SOAJ injection, Inject 0.3 mg into the muscle as needed for anaphylaxis. for allergic reaction   ezetimibe (ZETIA) 10 MG tablet, TAKE 1 TABLET BY MOUTH EVERY DAY   hydrochlorothiazide (HYDRODIURIL) 25 MG tablet, TAKE 1 TABLET BY MOUTH EVERY DAY   nitroGLYCERIN (NITROSTAT) 0.4 MG SL tablet, Place 1 tablet (0.4 mg total) under the tongue every 5 (five) minutes as needed for chest pain.   valsartan (DIOVAN) 320 MG tablet, TAKE 1 TABLET BY MOUTH EVERY DAY  Current Outpatient Medications (Respiratory):    albuterol (PROAIR HFA) 108 (90 Base) MCG/ACT inhaler, Inhale 1-2 puffs into the lungs every 6 (six) hours as needed for wheezing or shortness of breath.   fluticasone (FLONASE) 50 MCG/ACT nasal spray, Place 1 spray into both nostrils daily.   Fluticasone Furoate (ARNUITY ELLIPTA) 100 MCG/ACT AEPB, Inhale 1 Inhalation into the lungs daily.  Current Outpatient Medications (Analgesics):    acetaminophen (TYLENOL) 500 MG tablet, Take 1,000 mg by mouth every 6 (six) hours as needed for moderate pain or headache.   aspirin EC 81 MG tablet, Take 1 tablet (81 mg total) by mouth 2 (two) times daily after a meal.  Current Outpatient Medications (Hematological):    Cyanocobalamin (B-12) 2500 MCG TABS, Take 1,250 mcg by mouth daily.   folic acid (FOLVITE) 800 MCG tablet, Take 800 mcg by mouth daily.   Current Outpatient Medications (Other):    Gabapentin Enacarbil (HORIZANT) 600 MG TBCR, Take 1 tablet (600 mg total) by mouth at bedtime.   cholecalciferol (VITAMIN D3) 25 MCG (1000 UNIT) tablet, Take 1,000 Units by mouth daily.   diazepam (VALIUM) 5 MG tablet, TAKE 1 TABLET (5 MG TOTAL) BY MOUTH AT BEDTIME AS NEEDED (SLEEP).   gabapentin (NEURONTIN) 300 MG capsule, Take 2 cap  in AM, 1 cap at noon, 2caps at bedtime   Glucos-Chond-Hyal Ac-Ca Fructo (MOVE FREE JOINT HEALTH ADVANCE PO), Take 1 tablet by mouth daily.   ketoconazole (NIZORAL) 2 %  cream, Apply 1 Application topically 2 (two) times daily as needed.   Lidocaine HCl (ASPERCREME LIDOCAINE) 4 % CREA, Apply topically in the morning and at bedtime.   lubiprostone (AMITIZA) 8 MCG capsule, TAKE 1 CAPSULE BY MOUTH 2 TIMES DAILY WITH A MEAL.   mirabegron ER (MYRBETRIQ) 50 MG TB24 tablet, Take 1 tablet (50 mg total) by mouth daily.   pantoprazole (PROTONIX) 40 MG tablet, TAKE 1 TABLET BY MOUTH EVERY DAY   polyethylene glycol powder (GLYCOLAX/MIRALAX) powder, Take 17 g by mouth daily. (Patient taking differently: Take 17 g by mouth in the morning and at bedtime.)   Probiotic Product (PROBIOTIC PO), Take by mouth in the morning.   Reviewed prior external information including notes and imaging from  primary care provider As well as notes that were available from care everywhere and other healthcare systems.  Past medical history, social, surgical and family history all reviewed in electronic medical record.  No pertanent information unless stated regarding to the chief complaint.   Review of Systems:  No headache, visual changes, nausea, vomiting, diarrhea, constipation, dizziness, abdominal pain, skin rash, fevers, chills, night sweats, weight loss, swollen lymph nodes, body aches, joint swelling, chest pain, shortness of breath, mood changes. POSITIVE muscle aches  Objective  Blood pressure 108/76, pulse 67, height 5\' 6"  (1.676 m), weight 206 lb (93.4 kg), SpO2 96%.   General: No apparent distress alert and oriented x3 mood and affect normal, dressed appropriately.  HEENT: Pupils equal, extraocular movements intact  Respiratory: Patient's speak in full sentences and does not appear short of breath  Cardiovascular: No lower extremity edema, non tender, no erythema  Patient is sitting relatively comfortable but does have some pain with straight leg test.  Tightness in the hamstrings bilaterally.    Impression and Recommendations:     The above documentation has been reviewed  and is accurate and complete Judi Saa, DO

## 2023-05-12 ENCOUNTER — Encounter: Payer: Self-pay | Admitting: Family Medicine

## 2023-05-12 ENCOUNTER — Ambulatory Visit: Payer: PPO | Admitting: Family Medicine

## 2023-05-12 VITALS — BP 108/76 | HR 67 | Ht 66.0 in | Wt 206.0 lb

## 2023-05-12 DIAGNOSIS — M5442 Lumbago with sciatica, left side: Secondary | ICD-10-CM | POA: Diagnosis not present

## 2023-05-12 DIAGNOSIS — G8929 Other chronic pain: Secondary | ICD-10-CM | POA: Diagnosis not present

## 2023-05-12 DIAGNOSIS — G609 Hereditary and idiopathic neuropathy, unspecified: Secondary | ICD-10-CM | POA: Diagnosis not present

## 2023-05-12 MED ORDER — HORIZANT 600 MG PO TBCR: 1.0000 | EXTENDED_RELEASE_TABLET | Freq: Every evening | ORAL | 1 refills | Status: DC

## 2023-05-12 NOTE — Patient Instructions (Signed)
Epidural L5/S1 to try  Horizant 600mg  nightly try around dinner and hold gabapentin  See me again in 6-8 weeks after injection

## 2023-05-12 NOTE — Assessment & Plan Note (Signed)
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  We discussed potentially trying to keep it warm with different types of socks.  And changing potentially the gabapentin to Horizant and see if patient would make some improvement.  Discussed icing regimen and home exercises, discussed icing regimen and home exercises.  Follow-up again in 6 to 8 weeks otherwise.

## 2023-05-17 ENCOUNTER — Other Ambulatory Visit: Payer: Self-pay | Admitting: *Deleted

## 2023-05-17 DIAGNOSIS — M5416 Radiculopathy, lumbar region: Secondary | ICD-10-CM | POA: Diagnosis not present

## 2023-05-17 DIAGNOSIS — E785 Hyperlipidemia, unspecified: Secondary | ICD-10-CM

## 2023-05-18 ENCOUNTER — Telehealth: Payer: Self-pay | Admitting: Family Medicine

## 2023-05-18 NOTE — Telephone Encounter (Signed)
 Spoke with patient about recommendations. Scheduled with another provider tomorrow.

## 2023-05-18 NOTE — Telephone Encounter (Signed)
 Patient is calling to let Dr. Katrinka Blazing know that after his epidural yesterday he has a headache and does not feel well, could not sleep last night and is flushed. Patient also states he has a bit of blurred vision and feels like he may have the flu after getting the epidural yesterday in lower back. He spoke to a nurse on call with his insurance company and they wanted him to see or speak to his doctor. He wants to speak to Dr. Katrinka Blazing about this.

## 2023-05-19 ENCOUNTER — Encounter: Payer: Self-pay | Admitting: Family Medicine

## 2023-05-19 ENCOUNTER — Ambulatory Visit: Payer: PPO | Admitting: Gastroenterology

## 2023-05-19 ENCOUNTER — Ambulatory Visit: Payer: PPO | Admitting: Family Medicine

## 2023-05-19 VITALS — BP 136/72 | HR 76 | Ht 66.0 in | Wt 200.0 lb

## 2023-05-19 DIAGNOSIS — G9600 Cerebrospinal fluid leak, unspecified: Secondary | ICD-10-CM

## 2023-05-19 NOTE — Progress Notes (Signed)
   I, Stevenson Clinch, CMA acting as a scribe for Clementeen Graham, MD.  Joshua [name] is a 83 y.o. male who presents to [facility] at [facility] today for f/u chronic LBP after ESI. Pt was last seen by Dr. Katrinka Blazing on 05/12/23 and an ESI was ordered. ESI was done on 05/17/23 and pt sent a MyChart message reporting side effects from the injection.  Today, pt reports HA and flushed feeling after ESI. Was also experiencing blurry vision and flu-like symptoms. Continues to experience these sx, have not eased off much. Was able to get 6 hours of sleep last night. Feels very fatigued. Has had 3 other ESI in the past without this type of reaction. Also notes that this is the first time that the injections hurt during the procedure.   Pertinent review of systems: No fevers or chills  Relevant historical information: Hypertension   Exam:  BP 136/72   Pulse 76   Ht 5\' 6"  (1.676 m)   Wt 200 lb (90.7 kg)   SpO2 97%   BMI 32.28 kg/m  General: Well Developed, well nourished, and in no acute distress.   MSK: L-spine normal appearing skin lumbar spine.  No erythema.  Puncture wound is not visible.  Normal lumbar motion.  Normal gait.     Assessment and Plan: 83 y.o. male with headache and fatigue following an epidural steroid injection occurring on February 24 at [facility].  Based on his symptoms he had a CSF leak.  Fortunately he is spontaneously improving.  Advised relative rest and fluids.  He has pelvic physical therapy scheduled for tomorrow.  I think it is okay for him to go.  Check back as needed.  Will contact Dr. Lorrine Kin directly if not doing well.   PDMP not reviewed this encounter. No orders of the defined types were placed in this encounter.  No orders of the defined types were placed in this encounter.    Discussed warning signs or symptoms. Please see discharge instructions. Patient expresses understanding.   The above documentation has been reviewed  and is accurate and complete Clementeen Graham, M.D.

## 2023-05-19 NOTE — Patient Instructions (Addendum)
 Thank you for coming in today.  I think this was a CSF leak.   Ok to do my pelvic PT tomorrow.   Let me know if you are not OK.

## 2023-05-20 DIAGNOSIS — M6281 Muscle weakness (generalized): Secondary | ICD-10-CM | POA: Diagnosis not present

## 2023-05-20 DIAGNOSIS — N5082 Scrotal pain: Secondary | ICD-10-CM | POA: Diagnosis not present

## 2023-05-20 DIAGNOSIS — N393 Stress incontinence (female) (male): Secondary | ICD-10-CM | POA: Diagnosis not present

## 2023-05-20 DIAGNOSIS — M62838 Other muscle spasm: Secondary | ICD-10-CM | POA: Diagnosis not present

## 2023-05-24 ENCOUNTER — Encounter: Payer: Self-pay | Admitting: Neurology

## 2023-05-25 ENCOUNTER — Encounter: Payer: Self-pay | Admitting: Family Medicine

## 2023-05-25 ENCOUNTER — Ambulatory Visit (INDEPENDENT_AMBULATORY_CARE_PROVIDER_SITE_OTHER): Payer: PPO | Admitting: Family Medicine

## 2023-05-25 VITALS — BP 110/60 | HR 89 | Temp 97.2°F | Ht 66.0 in | Wt 199.2 lb

## 2023-05-25 DIAGNOSIS — I10 Essential (primary) hypertension: Secondary | ICD-10-CM | POA: Diagnosis not present

## 2023-05-25 DIAGNOSIS — Z131 Encounter for screening for diabetes mellitus: Secondary | ICD-10-CM | POA: Diagnosis not present

## 2023-05-25 DIAGNOSIS — N5082 Scrotal pain: Secondary | ICD-10-CM

## 2023-05-25 DIAGNOSIS — N183 Chronic kidney disease, stage 3 unspecified: Secondary | ICD-10-CM

## 2023-05-25 DIAGNOSIS — E538 Deficiency of other specified B group vitamins: Secondary | ICD-10-CM

## 2023-05-25 DIAGNOSIS — R739 Hyperglycemia, unspecified: Secondary | ICD-10-CM

## 2023-05-25 DIAGNOSIS — E785 Hyperlipidemia, unspecified: Secondary | ICD-10-CM | POA: Diagnosis not present

## 2023-05-25 LAB — CBC WITH DIFFERENTIAL/PLATELET
Basophils Absolute: 0.1 10*3/uL (ref 0.0–0.1)
Basophils Relative: 0.8 % (ref 0.0–3.0)
Eosinophils Absolute: 0.6 10*3/uL (ref 0.0–0.7)
Eosinophils Relative: 8.2 % — ABNORMAL HIGH (ref 0.0–5.0)
HCT: 42 % (ref 39.0–52.0)
Hemoglobin: 14.5 g/dL (ref 13.0–17.0)
Lymphocytes Relative: 13.4 % (ref 12.0–46.0)
Lymphs Abs: 1.1 10*3/uL (ref 0.7–4.0)
MCHC: 34.6 g/dL (ref 30.0–36.0)
MCV: 92.2 fl (ref 78.0–100.0)
Monocytes Absolute: 1 10*3/uL (ref 0.1–1.0)
Monocytes Relative: 12 % (ref 3.0–12.0)
Neutro Abs: 5.2 10*3/uL (ref 1.4–7.7)
Neutrophils Relative %: 65.6 % (ref 43.0–77.0)
Platelets: 256 10*3/uL (ref 150.0–400.0)
RBC: 4.55 Mil/uL (ref 4.22–5.81)
RDW: 12.8 % (ref 11.5–15.5)
WBC: 7.9 10*3/uL (ref 4.0–10.5)

## 2023-05-25 LAB — COMPREHENSIVE METABOLIC PANEL
ALT: 19 U/L (ref 0–53)
AST: 18 U/L (ref 0–37)
Albumin: 4 g/dL (ref 3.5–5.2)
Alkaline Phosphatase: 51 U/L (ref 39–117)
BUN: 22 mg/dL (ref 6–23)
CO2: 28 meq/L (ref 19–32)
Calcium: 10 mg/dL (ref 8.4–10.5)
Chloride: 102 meq/L (ref 96–112)
Creatinine, Ser: 1.24 mg/dL (ref 0.40–1.50)
GFR: 55 mL/min — ABNORMAL LOW (ref >60.00)
Glucose, Bld: 96 mg/dL (ref 70–99)
Potassium: 3.7 meq/L (ref 3.5–5.1)
Sodium: 138 meq/L (ref 135–145)
Total Bilirubin: 0.7 mg/dL (ref 0.2–1.2)
Total Protein: 6.7 g/dL (ref 6.0–8.3)

## 2023-05-25 LAB — HEMOGLOBIN A1C: Hgb A1c MFr Bld: 5.8 % (ref 4.6–6.5)

## 2023-05-25 LAB — VITAMIN B12: Vitamin B-12: 780 pg/mL (ref 211–911)

## 2023-05-25 NOTE — Patient Instructions (Addendum)
 Please stop by lab before you go If you have mychart- we will send your results within 3 business days of [place] receiving them.  If you do not have mychart- we will call you about results within 5 business days of [place] receiving them.  *please also note that you will see labs on mychart as soon as they post. I will later go in and write notes on them- will say "notes from Dr. Durene Cal"   Recommended follow up: Return in about 3 months (around 08/25/2023) for followup or sooner if needed.Schedule b4 you leave.

## 2023-05-25 NOTE — Progress Notes (Signed)
 Phone [phone] In person visit   Subjective:   Joshua [name] is a 83 y.o. year old very pleasant male patient who presents for/with See problem oriented charting Chief Complaint  Patient presents with   Medical Management of Chronic Issues   Hypertension   shaking in hands   Back Pain   Sleep Apnea    Pt states no longer sleeping with CPAP due to sleeping better w/o it.   Headache    More frequent since spinal column issue.    Past Medical History-  Patient Active Problem List   Diagnosis Date Noted   Scrotal pain 02/11/2023    Priority: High   Coronary atherosclerosis 10/12/2006    Priority: High   Overactive bladder 11/24/2019    Priority: Medium    Hyperglycemia 10/07/2017    Priority: Medium    Gout 11/20/2016    Priority: Medium    CKD (chronic kidney disease), stage III (HCC) 10/23/2013    Priority: Medium    Neuropathy (HCC) 08/21/2013    Priority: Medium    History of bladder cancer 04/01/2010    Priority: Medium    Essential hypertension 02/21/2007    Priority: Medium    Hyperlipidemia 10/12/2006    Priority: Medium    Asthma 10/12/2006    Priority: Medium    Aortic atherosclerosis (HCC) 05/05/2018    Priority: Low   Senile purpura (HCC) 10/07/2017    Priority: Low   DJD (degenerative joint disease)     Priority: Low   Arthritis     Priority: Low   Neck pain 02/08/2017    Priority: Low   Venous insufficiency 12/29/2016    Priority: Low   Ganglion cyst of tendon sheath of right hand 12/29/2016    Priority: Low   Obesity, Class I, BMI 30-34.9 10/03/2015    Priority: Low   Allergic rhinitis 05/31/2014    Priority: Low   Fatigue 11/09/2013    Priority: Low   Dizziness 08/23/2013    Priority: Low   Chronic bilateral low back pain with left-sided sciatica 10/09/2011    Priority: Low   Localized osteoarthrosis, lower leg 10/04/2009    Priority: Low   Tinnitus 05/05/2007    Priority: Low   OSA (obstructive sleep apnea) 11/02/2006     Priority: Low   GERD 10/12/2006    Priority: Low   De Quervain's tenosynovitis, right 10/28/2022    Priority: 1.   Degenerative arthritis of right knee 06/08/2022    Priority: 1.   Left hamstring injury 04/21/2022    Priority: 1.   Degenerative cervical disc 01/12/2022    Priority: 1.   Greater trochanteric bursitis of right hip 02/17/2021    Priority: 1.   Primary osteoarthritis of left knee 06/18/2020    Priority: 1.   Trigger thumb, left thumb 01/24/2020    Priority: 1.   Greater trochanteric bursitis of left hip 10/27/2018    Priority: 1.   Degenerative disc disease, lumbar 10/27/2018    Priority: 1.   Abdominal muscle strain, initial encounter 06/16/2018    Priority: 1.   Weakness of left leg 06/21/2017    Priority: 1.   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     albuterol (PROAIR HFA) 108 (90 Base) MCG/ACT inhaler Inhale 1-2 puffs into the lungs every 6 (six) hours as needed for wheezing or shortness of breath. 18 g 5   Alirocumab (PRALUENT) 150 MG/ML SOAJ INJECT 1 DOSE INTO THE SKIN EVERY 14 (FOURTEEN) DAYS. 6 mL 3   amLODipine (NORVASC) 5 MG tablet TAKE 1 TABLET (5 MG TOTAL) BY MOUTH DAILY. 90 tablet 3   aspirin EC 81 MG tablet Take 1 tablet (81 mg total) by mouth 2 (two) times daily after a meal. 30 tablet 11   cholecalciferol (VITAMIN D3) 25 MCG (1000 UNIT) tablet Take 1,000 Units by mouth daily.     Cyanocobalamin (B-12) 2500  MCG TABS Take 1,250 mcg by mouth daily.     EPINEPHrine 0.3 mg/0.3 mL IJ SOAJ injection Inject 0.3 mg into the muscle as needed for anaphylaxis. for allergic reaction     ezetimibe (ZETIA) 10 MG tablet TAKE 1 TABLET BY MOUTH EVERY DAY 90 tablet 2   fluticasone (FLONASE) 50 MCG/ACT nasal spray Place 1 spray into both nostrils daily.     Fluticasone Furoate (ARNUITY ELLIPTA) 100 MCG/ACT AEPB Inhale 1 Inhalation into the lungs daily.     folic acid (FOLVITE) 800 MCG tablet Take 800 mcg by mouth daily.      gabapentin (NEURONTIN) 300 MG capsule Take 2 cap in AM, 1 cap at noon, 2caps at bedtime 450 capsule 3   Gabapentin Enacarbil (HORIZANT) 600 MG TBCR Take 1 tablet (600 mg total) by mouth at bedtime. 30 tablet 1   Glucos-Chond-Hyal Ac-Ca Fructo (MOVE FREE JOINT HEALTH ADVANCE PO) Take 1 tablet by mouth daily.     hydrochlorothiazide (HYDRODIURIL) 25 MG tablet TAKE 1 TABLET BY MOUTH EVERY DAY 90 tablet 2   ketoconazole (NIZORAL) 2 % cream Apply 1 Application topically 2 (two) times daily as needed.     Lidocaine HCl (ASPERCREME LIDOCAINE) 4 % CREA Apply topically in the morning and at bedtime.     lubiprostone (AMITIZA) 8 MCG capsule TAKE 1 CAPSULE BY MOUTH 2 TIMES DAILY WITH A MEAL. 180 capsule 1   mirabegron ER (MYRBETRIQ) 50 MG TB24 tablet Take 1 tablet (50 mg total) by mouth daily. 90 tablet 1   nitroGLYCERIN (NITROSTAT) 0.4 MG SL tablet Place 1 tablet (0.4 mg total) under the tongue every 5 (five) minutes as needed for chest pain. 30 tablet 5   pantoprazole (PROTONIX) 40 MG tablet TAKE 1 TABLET BY MOUTH EVERY DAY 90 tablet 3   polyethylene glycol powder (GLYCOLAX/MIRALAX) powder Take 17 g by mouth daily. (Patient taking differently: Take 17 g by mouth in the morning and at bedtime.) 255 g 0   Probiotic Product (PROBIOTIC PO) Take by mouth in the morning.     valsartan (DIOVAN) 320 MG tablet TAKE 1 TABLET BY MOUTH EVERY DAY 90 tablet 3   diazepam (VALIUM) 5 MG tablet TAKE 1 TABLET (5 MG TOTAL) BY  MOUTH AT BEDTIME AS NEEDED (SLEEP). (Patient not taking: Reported on 05/25/2023) 30 tablet 0   No current facility-administered medications for this visit.     Objective:  BP 110/60   Pulse 89   Temp (!) 97.2 F (36.2 C)   Ht 5\' 6"  (1.676 m)   Wt 199 lb 3.2 oz (90.4 kg)  SpO2 96%   BMI 32.15 kg/m  Gen: NAD, resting comfortably CV: RRR no murmurs rubs or gallops Lungs: CTAB no crackles, wheeze, rhonchi Abdomen: soft/nontender/nondistended/normal bowel sounds. No rebound or guarding.  Ext: trace edema left > right Skin: warm, dry      Assessment and Plan   #social update- younger brother in accident and pretty bad- that's been tough. Older brother sounds like possible dementia- a lot of stress on his nephew  #weight loss- got down under 200 even with clothes on! Congratulted his efforrts- reports lower appetite  # Headache and fatigue following epidural steroid injection in February by Neurosurgery-Concern Is He May Have Had a CSF Leak but He Is Spontaneously Improving so He Was Advised to Rest at February 26 Visit with Dr. Denyse Amass -This was to attempt to address neuropathy issues at least somewhat - headache persistent since epidural gradually improving -also some shakiness in morning afterwards -neuropathy worsening he reports-he remains on gabapentin and has seen Dr. Karel Jarvis for this as well- has had some recent tweaks on this- and reached out to Dr. Karel Jarvis yesterday  #Left Knee- doing slightly better with Dr. Elby Showers procedure last year but tolerates  # Chronic scrotal pain-has seen Dr. Lafonda Mosses and they had discussed cord block to help with this- but not helpful- going to try pelvic floor physical therapy    # Diverticulitis-we discussed at last visit and he was improving on Augmentin and reports finishing a course with no recurrence   # Ongoing fatigue last visit- we discussed checking TSH at last visit but he feels better. Reports coming off CPAP helped- didn't do well with  that- actually slept worse  #CAD/hyperlipidemia-history of CABG x5- follows with Dr. Antoine Poche S: Meds: asa 81mg , praluent 150 mg q2 weeks- tomorrow, zetia 10 mg daily -Stent drug eluting placed 04/13/19 Dr. Clifton James -no shortness of breath . Some chest pressure about 2 weeks ago and nitroglycerin didn't help- pressure resolved on its own Lab Results  Component Value Date   CHOL 115 06/29/2022   HDL 42 06/29/2022   LDLCALC 46 06/29/2022   LDLDIRECT 115.0 06/04/2016   TRIG 159 (H) 06/29/2022   CHOLHDL 2.7 06/29/2022  A/P: coronary artery disease largely asymptomatic - continue current medications   - lipids at goal last year- update today   #Hypertension/CKD stage III S: Compliant with amlodipine 5 mg, hydrochlorothiazide 25 mg and valsartan 320mg  -GFR has been stable.  On ARB in case proteinuric element A/P: well controlled continue current medications    #Hyperglycemia history of mildly elevated A1c.  Prediabetes range. Update today Lab Results  Component Value Date   HGBA1C 5.9 08/19/2022   HGBA1C 6.1 03/25/2022   HGBA1C 5.7 05/13/2021    #b12 deficiency-on oral B12.  Change to every other day August 26, 2019 - #s looked good last year- update today Lab Results  Component Value Date   VITAMINB12 850 03/25/2022   Recommended follow up: Return in about 3 months (around 08/25/2023) for followup or sooner if needed.Schedule b4 you leave. Future Appointments  Date Time Provider Department Center  06/10/2023 10:00 AM [facility] ANNUAL WELLNESS VISIT 1 [facility]  06/25/2023 10:20 AM Legrand Como, PA-C [facility]  06/28/2023  8:30 AM Judi Saa, DO [facility] None  08/05/2023 11:30 AM Henrene Pastor, RPH-CPP [facility] None  08/18/2023  9:30 AM Hilty, Lisette Abu, MD [facility]  04/27/2024 10:00 AM Van Clines, MD [facility] None   Lab/Order associations:   ICD-10-CM   1. Essential  hypertension  I10 Comprehensive metabolic panel    CBC with Differential/Platelet    2.  Stage 3 chronic kidney disease, unspecified whether stage 3a or 3b CKD (HCC)  N18.30 Comprehensive metabolic panel    CBC with Differential/Platelet    3. Hyperlipidemia, unspecified hyperlipidemia type  E78.5     4. Scrotal pain  N50.82     5. Hyperglycemia  R73.9 Hemoglobin A1c    6. Screening for diabetes mellitus  Z13.1 Hemoglobin A1c    7. B12 deficiency  E53.8 Vitamin B12     No orders of the defined types were placed in this encounter.  Return precautions advised.  Tana Conch, MD

## 2023-05-26 MED ORDER — GABAPENTIN 300 MG PO CAPS: ORAL_CAPSULE | ORAL | 3 refills | Status: DC

## 2023-06-08 DIAGNOSIS — N3941 Urge incontinence: Secondary | ICD-10-CM | POA: Diagnosis not present

## 2023-06-08 DIAGNOSIS — M62838 Other muscle spasm: Secondary | ICD-10-CM | POA: Diagnosis not present

## 2023-06-08 DIAGNOSIS — M6281 Muscle weakness (generalized): Secondary | ICD-10-CM | POA: Diagnosis not present

## 2023-06-08 DIAGNOSIS — R102 Pelvic and perineal pain: Secondary | ICD-10-CM | POA: Diagnosis not present

## 2023-06-08 DIAGNOSIS — K5900 Constipation, unspecified: Secondary | ICD-10-CM | POA: Diagnosis not present

## 2023-06-09 ENCOUNTER — Telehealth: Payer: Self-pay

## 2023-06-09 NOTE — Telephone Encounter (Signed)
 Pt called an went over Gabapentin RX take Take 2 cap in AM, 1 cap at noon, 3 caps at bedtime

## 2023-06-10 ENCOUNTER — Ambulatory Visit: Payer: PPO

## 2023-06-10 VITALS — Ht 66.5 in | Wt 195.0 lb

## 2023-06-10 DIAGNOSIS — Z0000 Encounter for general adult medical examination without abnormal findings: Secondary | ICD-10-CM

## 2023-06-10 NOTE — Progress Notes (Signed)
 Subjective:   Joshua [name] is a 83 y.o. who presents for a Medicare Wellness preventive visit.  Visit Complete: Virtual I connected with  Colon Flattery on 06/10/23 by a audio enabled telemedicine application and verified that I am speaking with the correct person using two identifiers.  Patient Location: Home  Provider Location: Office/Clinic  I discussed the limitations of evaluation and management by telemedicine. The patient expressed understanding and agreed to proceed.  Vital Signs: Because this visit was a virtual/telehealth visit, some criteria may be missing or patient reported. Any vitals not documented were not able to be obtained and vitals that have been documented are patient reported.  VideoDeclined- This patient declined Librarian, academic. Therefore the visit was completed with audio only.  Persons Participating in Visit: Patient.  AWV Questionnaire: No: Patient Medicare AWV questionnaire was not completed prior to this visit.  Cardiac Risk Factors include: advanced age (>38men, >28 women);dyslipidemia;hypertension;male gender;obesity (BMI >30kg/m2)     Objective:    Today's Vitals   06/10/23 1007 06/10/23 1008  Weight: 195 lb (88.5 kg)   Height: 5' 6.5" (1.689 m)   PainSc:  4    Body mass index is 31 kg/m.     06/10/2023   10:16 AM 04/28/2023    8:33 AM 03/18/2023    7:22 AM 11/19/2022    9:28 AM 06/04/2022    8:43 AM 04/27/2022   10:19 AM 01/19/2022    9:14 AM  Advanced Directives  Does Patient Have a Medical Advance Directive? Yes Yes Yes Yes Yes Yes Yes  Type of Estate agent of [place];Living will Healthcare Power of [place];Living will;Out of facility DNR (pink MOST or yellow form) Healthcare Power of [place];Living will Healthcare Power of [place];Living will Healthcare Power of [place];Living will Healthcare Power of [place];Living will;Out of facility DNR (pink MOST or yellow form)  Healthcare Power of Attorney  Does patient want to make changes to medical advance directive? No - Patient declined    No - Patient declined  No - Patient declined  Copy of Healthcare Power of Attorney in Chart? Yes - validated most recent copy scanned in chart (See row information)  No - copy requested  Yes - validated most recent copy scanned in chart (See row information)  Yes - validated most recent copy scanned in chart (See row information)    Current Medications (verified) Outpatient Encounter Medications as of 06/10/2023  Medication Sig   acetaminophen (TYLENOL) 500 MG tablet Take 1,000 mg by mouth every 6 (six) hours as needed for moderate pain or headache.   albuterol (PROAIR HFA) 108 (90 Base) MCG/ACT inhaler Inhale 1-2 puffs into the lungs every 6 (six) hours as needed for wheezing or shortness of breath.   Alirocumab (PRALUENT) 150 MG/ML SOAJ INJECT 1 DOSE INTO THE SKIN EVERY 14 (FOURTEEN) DAYS.   amLODipine (NORVASC) 5 MG tablet TAKE 1 TABLET (5 MG TOTAL) BY MOUTH DAILY.   aspirin EC 81 MG tablet Take 1 tablet (81 mg total) by mouth 2 (two) times daily after a meal.   cholecalciferol (VITAMIN D3) 25 MCG (1000 UNIT) tablet Take 1,000 Units by mouth daily.   Cyanocobalamin (B-12) 2500 MCG TABS Take 1,250 mcg by mouth daily.   diazepam (VALIUM) 5 MG tablet TAKE 1 TABLET (5 MG TOTAL) BY MOUTH AT BEDTIME AS NEEDED (SLEEP).   diphenhydramine-acetaminophen (TYLENOL PM) 25-500 MG TABS tablet Take 1 tablet by mouth at bedtime as needed.   ezetimibe (ZETIA) 10  MG tablet TAKE 1 TABLET BY MOUTH EVERY DAY   fluticasone (FLONASE) 50 MCG/ACT nasal spray Place 1 spray into both nostrils daily.   folic acid (FOLVITE) 800 MCG tablet Take 800 mcg by mouth daily.    gabapentin (NEURONTIN) 300 MG capsule Take 2 cap in AM, 1 cap at noon, 3 caps at bedtime   Glucos-Chond-Hyal Ac-Ca Fructo (MOVE FREE JOINT HEALTH ADVANCE PO) Take 1 tablet by mouth daily.   hydrochlorothiazide (HYDRODIURIL) 25 MG tablet  TAKE 1 TABLET BY MOUTH EVERY DAY   ketoconazole (NIZORAL) 2 % cream Apply 1 Application topically 2 (two) times daily as needed.   Lidocaine HCl (ASPERCREME LIDOCAINE) 4 % CREA Apply topically in the morning and at bedtime.   lubiprostone (AMITIZA) 8 MCG capsule TAKE 1 CAPSULE BY MOUTH 2 TIMES DAILY WITH A MEAL.   mirabegron ER (MYRBETRIQ) 50 MG TB24 tablet Take 1 tablet (50 mg total) by mouth daily.   nitroGLYCERIN (NITROSTAT) 0.4 MG SL tablet Place 1 tablet (0.4 mg total) under the tongue every 5 (five) minutes as needed for chest pain.   pantoprazole (PROTONIX) 40 MG tablet TAKE 1 TABLET BY MOUTH EVERY DAY   polyethylene glycol powder (GLYCOLAX/MIRALAX) powder Take 17 g by mouth daily. (Patient taking differently: Take 17 g by mouth once.)   valsartan (DIOVAN) 320 MG tablet TAKE 1 TABLET BY MOUTH EVERY DAY   EPINEPHrine 0.3 mg/0.3 mL IJ SOAJ injection Inject 0.3 mg into the muscle as needed for anaphylaxis. for allergic reaction (Patient not taking: Reported on 06/10/2023)   Fluticasone Furoate (ARNUITY ELLIPTA) 100 MCG/ACT AEPB Inhale 1 Inhalation into the lungs daily. (Patient not taking: Reported on 06/10/2023)   Probiotic Product (PROBIOTIC PO) Take by mouth in the morning. (Patient not taking: Reported on 06/10/2023)   [DISCONTINUED] Gabapentin Enacarbil (HORIZANT) 600 MG TBCR Take 1 tablet (600 mg total) by mouth at bedtime.   No facility-administered encounter medications on file as of 06/10/2023.    Allergies (verified) Codeine, Lexapro [escitalopram], Bromfed, Clarithromycin, Cymbalta [duloxetine hcl], Doxycycline hyclate, Modafinil, Oxycodone-acetaminophen, Promethazine hcl, Quinolones, Telithromycin, Tramadol, Benadryl [diphenhydramine], Hydrocodone-acetaminophen, Repatha [evolocumab], Statins, and Sulfonamide derivatives   History: Past Medical History:  Diagnosis Date   Acute medial meniscal tear    Anemia 2015   Arthritis    "middle finger right hand; right knee; neck"  (02/06/2014)   Asthma    seasonal   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"   CAD (coronary artery disease)    CABG 2001   Diverticulosis    DJD (degenerative joint disease)    BACK   GERD (gastroesophageal reflux disease)    H/O inguinal hernia repair 12/2018   History of gout    Hyperlipidemia    Hypertension    IBS (irritable bowel syndrome)    Obesity    OSA (obstructive sleep apnea)    USES CPAP    Pancreatitis ~ 1980   Peripheral vascular disease (HCC)    Pre-diabetes    Prostate cancer (HCC) 04/2010   Past Surgical History:  Procedure Laterality Date   ANTERIOR CERVICAL DECOMP/DISCECTOMY FUSION  05/26/2006   PLATE PLACED   BACK SURGERY     CARDIAC CATHETERIZATION  11/20/1999   CORONARY ARTERY BYPASS GRAFT  11/20/1999   SVG-RI1-RI2, SVG-OM, SVG-dRCA   CORONARY STENT INTERVENTION N/A 04/13/2019   Procedure: CORONARY STENT INTERVENTION;  Surgeon: Kathleene Hazel, MD;  Location: [facility];  Service: Cardiovascular;  Laterality: N/A;   HERNIA REPAIR  INGUINAL HERNIA REPAIR Left 01/02/2019   Procedure: OPEN LEFT INGUINAL HERNIA REPAIR WITH MESH;  Surgeon: Luretha Murphy, MD;  Location: [facility];  Service: General;  Laterality: Left;   IR EMBO ARTERIAL NOT HEMORR HEMANG INC GUIDE ROADMAPPING  03/08/2023   IR RADIOLOGIST EVAL & MGMT  02/05/2023   IR RADIOLOGIST EVAL & MGMT  04/05/2023   IR RADIOLOGIST EVAL & MGMT  04/09/2023   IR RADIOLOGIST EVAL & MGMT  04/30/2023   KNEE ARTHROSCOPY Right 1992; 04/2008   LAPAROSCOPIC CHOLECYSTECTOMY  2007   LEFT HEART CATH AND CORS/GRAFTS ANGIOGRAPHY N/A 04/13/2019   Procedure: LEFT HEART CATH AND CORS/GRAFTS ANGIOGRAPHY;  Surgeon: Kathleene Hazel, MD;  Location: [facility];  Service: Cardiovascular;  Laterality: N/A;   NASAL SEPTUM SURGERY Right 2007   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   ROBOT ASSISTED LAPAROSCOPIC RADICAL PROSTATECTOMY  04/2010   THYROIDECTOMY, PARTIAL  1980    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   Procedure: LEFT TOTAL KNEE ARTHROPLASTY;  Surgeon: Marcene Corning, MD;  Location: [facility];  Service: Orthopedics;  Laterality: Left;   Family History  Problem Relation Age of Onset   Heart disease Mother    Hyperlipidemia Mother    Heart disease Father    Hyperlipidemia Father    Diabetes Brother    Colon cancer Neg Hx    Pancreatic cancer Neg Hx    Esophageal cancer Neg Hx    Stomach cancer Neg Hx    Liver cancer Neg Hx    Social History   Socioeconomic History   Marital status: Married    Spouse name: Not on file   Number of children: Not on file   Years of education: Not on file   Highest education level: Associate degree: occupational, Scientist, product/process development, or vocational program  Occupational History   Occupation: retired    Associate Professor: RETIRED  Tobacco Use   Smoking status: Former    Current packs/day: 0.00    Average packs/day: 1 pack/day for 35.0 years (35.0 ttl pk-yrs)    Types: Cigarettes    Start date: 06/16/1957    Quit date: 06/16/1992    Years since quitting: 31.0   Smokeless tobacco: Never  Vaping Use   Vaping status: Never Used  Substance and Sexual Activity   Alcohol use: Yes    Alcohol/week: 2.0 standard drinks of alcohol    Types: 2 Shots of liquor per week    Comment: occ   Drug use: No   Sexual activity: Not Currently    Partners: Female  Other Topics Concern   Not on file  Social History Narrative   Married 42 years in 2015. 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      Hobbies: poker, movies and tv, staying active      Right handed   2 level home with steps he uses   Social Drivers of Corporate investment banker Strain: Low Risk  (06/10/2023)   Overall Financial Resource Strain (CARDIA)    Difficulty of Paying Living Expenses: Not hard at all  Food Insecurity: No Food Insecurity (06/10/2023)   Hunger Vital Sign    Worried About Running Out  of Food in the Last Year: Never true    Ran Out of Food in the Last Year: Never true  Transportation Needs: No Transportation Needs (06/10/2023)   PRAPARE - Transportation  Lack of Transportation (Medical): No    Lack of Transportation (Non-Medical): No  Physical Activity: Insufficiently Active (06/10/2023)   Exercise Vital Sign    Days of Exercise per Week: 4 days    Minutes of Exercise per Session: 10 min  Stress: No Stress Concern Present (06/10/2023)   [employer] of Occupational Health - Occupational Stress Questionnaire    Feeling of Stress : Not at all  Recent Concern: Stress - Stress Concern Present (05/21/2023)   [employer] of Occupational Health - Occupational Stress Questionnaire    Feeling of Stress : To some extent  Social Connections: Moderately Isolated (06/10/2023)   Social Connection and Isolation Panel [NHANES]    Frequency of Communication with Friends and Family: More than three times a week    Frequency of Social Gatherings with Friends and Family: More than three times a week    Attends Religious Services: Never    Database administrator or Organizations: No    Attends Engineer, structural: Never    Marital Status: Married    Tobacco Counseling Counseling given: Not Answered    Clinical Intake:  Pre-visit preparation completed: Yes  Pain : 0-10 Pain Score: 4  Pain Type: Chronic pain Pain Location: Generalized Pain Descriptors / Indicators: Aching Pain Onset: More than a month ago     BMI - recorded: 31 Nutritional Status: BMI > 30  Obese Nutritional Risks: None Diabetes: No  Lab Results  Component Value Date   HGBA1C 5.8 05/25/2023   HGBA1C 5.9 08/19/2022   HGBA1C 6.1 03/25/2022     How often do you need to have someone help you when you read instructions, pamphlets, or other written materials from your doctor or pharmacy?: 1 - Never  Interpreter Needed?: No  Information entered by :: Lanier Ensign,  LPN   Activities of Daily Living      06/10/2023   10:09 AM  In your present state of health, do you have any difficulty performing the following activities:  Hearing? 1  Comment hearing aids  Vision? 0  Difficulty concentrating or making decisions? 0  Walking or climbing stairs? 1  Comment at times just difficult  Dressing or bathing? 0  Doing errands, shopping? 0  Preparing Food and eating ? N  Using the Toilet? N  In the past six months, have you accidently leaked urine? Y  Comment wears a pad  Do you have problems with loss of bowel control? N  Managing your Medications? N  Managing your Finances? N  Housekeeping or managing your Housekeeping? N    Patient Care Team: Shelva Majestic, MD as PCP - General (Family Medicine) Rollene Rotunda, MD as PCP - Cardiology (Cardiology) Andrena Mews, DO as Consulting Physician (Sports Medicine) Van Clines, MD as Consulting Physician (Neurology) Coralyn Helling, MD (Inactive) as Consulting Physician (Pulmonary Disease) Judi Saa, DO as Consulting Physician (Sports Medicine) Eileen Stanford, MD as Consulting Physician (Allergy and Immunology) Jearl Klinefelter (Gastroenterology) Henrene Pastor, RPH-CPP (Pharmacist)  Indicate any recent Medical Services you may have received from other than [facility] providers in the past year (date may be approximate).     Assessment:   This is a routine wellness examination for [place].  Hearing/Vision screen Hearing Screening - Comments:: Pt wears hearing aids  Vision Screening - Comments:: Pt follows up with Dr Randon Goldsmith for annual eye exams   Goals Addressed             This Visit's  Progress    Patient Stated       Patient Stated       Maintain health and activity       Depression Screen     06/10/2023   10:18 AM 03/22/2023    1:35 PM 12/17/2022    9:38 AM 12/11/2022    4:09 PM 11/03/2022   10:10 AM 09/18/2022    7:59 AM 09/02/2022    9:19 AM  PHQ 2/9 Scores  PHQ - 2  Score 1 2 2 1 3  0 0  PHQ- 9 Score  6  5 12  0     Fall Risk      06/10/2023   10:20 AM 04/28/2023    8:33 AM 03/22/2023    1:35 PM 12/17/2022    9:38 AM 12/11/2022    4:09 PM  Fall Risk   Falls in the past year? 1 1 0 0 0  Number falls in past yr: 1 0 0 0 0  Injury with Fall? 0 1 0  0  Risk for fall due to : History of fall(s)  No Fall Risks  No Fall Risks  Follow up Falls prevention discussed Falls evaluation completed Falls evaluation completed;Falls prevention discussed  Falls evaluation completed    MEDICARE RISK AT HOME:  Medicare Risk at Home Any stairs in or around the home?: Yes If so, are there any without handrails?: No Home free of loose throw rugs in walkways, pet beds, electrical cords, etc?: Yes Adequate lighting in your home to reduce risk of falls?: Yes Life alert?: No Use of a cane, walker or w/c?: No Grab bars in the bathroom?: Yes Shower chair or bench in shower?: Yes Elevated toilet seat or a handicapped toilet?: No  TIMED UP AND GO:  Was the test performed?  No  Cognitive Function: 6CIT completed    08/05/2017   10:48 AM 05/27/2016    9:36 AM  MMSE - Mini Mental State Exam  Not completed: -- --      09/25/2014    3:39 PM  Montreal Cognitive Assessment   Visuospatial/ Executive (0/5) 5  Naming (0/3) 3  Attention: Read list of digits (0/2) 2  Attention: Read list of letters (0/1) 1  Attention: Serial 7 subtraction starting at 100 (0/3) 3  Language: Repeat phrase (0/2) 2  Language : Fluency (0/1) 1  Abstraction (0/2) 2  Delayed Recall (0/5) 3  Orientation (0/6) 6  Total 28  Adjusted Score (based on education) 28      06/10/2023   10:24 AM 06/04/2022    8:45 AM 05/22/2021   11:16 AM 05/06/2020   12:09 PM 02/06/2019   11:20 AM  6CIT Screen  What Year? 0 points 0 points 0 points 0 points 0 points  What month? 0 points 0 points 0 points 0 points 0 points  What time? 0 points 0 points 0 points  0 points  Count back from 20 0 points 0 points 0  points 4 points 0 points  Months in reverse 0 points 0 points 0 points 0 points 0 points  Repeat phrase 0 points 0 points 0 points 2 points 0 points  Total Score 0 points 0 points 0 points  0 points    Immunizations Immunization History  Administered Date(s) Administered   Fluad Quad(high Dose 65+) 11/21/2018, 12/11/2020, 12/18/2021   Fluad Trivalent(High Dose 65+) 12/08/2022   Influenza Split 01/08/2012, 11/12/2014   Influenza Whole 12/08/2007, 12/26/2008, 12/22/2010   Influenza, High Dose Seasonal  PF 11/16/2015, 01/02/2016, 01/01/2017, 01/06/2018, 02/01/2019, 11/24/2019, 01/23/2020, 01/21/2021   Influenza,inj,Quad PF,6+ Mos 11/30/2012   Influenza-Unspecified 01/12/2014, 12/21/2017   PFIZER Comirnaty(Gray Top)Covid-19 Tri-Sucrose Vaccine 07/29/2020, 01/29/2022   PFIZER(Purple Top)SARS-COV-2 Vaccination 04/28/2019, 05/23/2019, 12/21/2019, 01/23/2020   PNEUMOCOCCAL CONJUGATE-20 09/26/2021   Pfizer Covid-19 Vaccine Bivalent Booster 73yrs & up 12/11/2020   Pfizer(Comirnaty)Fall Seasonal Vaccine 12 years and older 01/29/2022, 12/08/2022   Pneumococcal Conjugate-13 05/31/2014   Pneumococcal Polysaccharide-23 06/04/2006, 07/15/2012, 01/02/2016, 12/31/2016, 01/23/2020, 01/21/2021   Respiratory Syncytial Virus Vaccine,Recomb Aduvanted(Arexvy) 02/27/2022   Td 04/23/2005, 05/13/2021   Tdap 02/18/2016   Zoster Recombinant(Shingrix) 01/31/2018, 04/02/2018   Zoster, Live 01/08/2012    Screening Tests Health Maintenance  Topic Date Due   COVID-19 Vaccine (9 - 2024-25 season) 06/07/2023   Medicare Annual Wellness (AWV)  06/09/2024   DTaP/Tdap/Td (4 - Td or Tdap) 05/14/2031   Pneumonia Vaccine 58+ Years old  Completed   INFLUENZA VACCINE  Completed   Zoster Vaccines- Shingrix  Completed   HPV VACCINES  Aged Out   Colonoscopy  Discontinued   Hepatitis C Screening  Discontinued    Health Maintenance  Health Maintenance Due  Topic Date Due   COVID-19 Vaccine (9 - 2024-25 season)  06/07/2023   Health Maintenance Items Addressed: See Nurse Notes  Additional Screening:  Vision Screening: Recommended annual ophthalmology exams for early detection of glaucoma and other disorders of the eye.  Dental Screening: Recommended annual dental exams for proper oral hygiene  Community Resource Referral / Chronic Care Management: CRR required this visit?  No   CCM required this visit?  No     Plan:     I have personally reviewed and noted the following in the patient's chart:   Medical and social history Use of alcohol, tobacco or illicit drugs  Current medications and supplements including opioid prescriptions. Patient is not currently taking opioid prescriptions. Functional ability and status Nutritional status Physical activity Advanced directives List of other physicians Hospitalizations, surgeries, and ER visits in previous 12 months Vitals Screenings to include cognitive, depression, and falls Referrals and appointments  In addition, I have reviewed and discussed with patient certain preventive protocols, quality metrics, and best practice recommendations. A written personalized care plan for preventive services as well as general preventive health recommendations were provided to patient.     Marzella Schlein, LPN   [id number]/52/[id number]   After Visit Summary: (MyChart) Due to this being a telephonic visit, the after visit summary with patients personalized plan was offered to patient via MyChart   Notes:  Pt request any information on [employer] [facility] if you have any?

## 2023-06-10 NOTE — Patient Instructions (Signed)
 Mr. Joshua [name] , Thank you for taking time to come for your Medicare Wellness Visit. I appreciate your ongoing commitment to your health goals. Please review the following plan we discussed and let me know if I can assist you in the future.   Referrals/Orders/Follow-Ups/Clinician Recommendations: Each day, aim for 6 glasses of water, plenty of protein in your diet and try to get up and walk/ stretch every hour for 5-10 minutes at a time.    This is a list of the screening recommended for you and due dates:  Health Maintenance  Topic Date Due   COVID-19 Vaccine (9 - 2024-25 season) 06/07/2023   Medicare Annual Wellness Visit  06/09/2024   DTaP/Tdap/Td vaccine (4 - Td or Tdap) 05/14/2031   Pneumonia Vaccine  Completed   Flu Shot  Completed   Zoster (Shingles) Vaccine  Completed   HPV Vaccine  Aged Out   Colon Cancer Screening  Discontinued   Hepatitis C Screening  Discontinued    Advanced directives: (In Chart) A copy of your advanced directives are scanned into your chart should your provider ever need it.  Next Medicare Annual Wellness Visit scheduled for next year: Yes

## 2023-06-15 DIAGNOSIS — M5416 Radiculopathy, lumbar region: Secondary | ICD-10-CM | POA: Diagnosis not present

## 2023-06-15 DIAGNOSIS — M792 Neuralgia and neuritis, unspecified: Secondary | ICD-10-CM | POA: Diagnosis not present

## 2023-06-17 ENCOUNTER — Telehealth: Payer: Self-pay | Admitting: Primary Care

## 2023-06-17 DIAGNOSIS — K5900 Constipation, unspecified: Secondary | ICD-10-CM | POA: Diagnosis not present

## 2023-06-17 DIAGNOSIS — M62838 Other muscle spasm: Secondary | ICD-10-CM | POA: Diagnosis not present

## 2023-06-17 DIAGNOSIS — M6281 Muscle weakness (generalized): Secondary | ICD-10-CM | POA: Diagnosis not present

## 2023-06-17 DIAGNOSIS — R102 Pelvic and perineal pain: Secondary | ICD-10-CM | POA: Diagnosis not present

## 2023-06-17 DIAGNOSIS — N3941 Urge incontinence: Secondary | ICD-10-CM | POA: Diagnosis not present

## 2023-06-17 NOTE — Telephone Encounter (Signed)
 Patient would like to know if he could use Zepbound instead of CPAP machine. Patient phone number is [phone].

## 2023-06-18 NOTE — Telephone Encounter (Signed)
 We are not prescribing weight loss medication at this time, looking to start come late April/May. Advised he reach out to his PCP who can prescribed Zepbound for weight loss/OSA. He needs a visit to discuss, due in August

## 2023-06-18 NOTE — Telephone Encounter (Signed)
 I called and spoke with pt. Pt states his CPAP breaks through at night and makes a lot of noise. Pt states the mask is irritating and irritates his skin. Pt states he is very unhappy with CPAP. Pt states he would rather be on medication instead. Pt states he has already taken himself off of CPAP. Please advise.

## 2023-06-18 NOTE — Telephone Encounter (Signed)
 I called and spoke to pt. Pt informed of Beth's note and verbalized understanding. Pt is due for an appointment with Buelah Manis, NP no later than August 2025. Please have pt scheduled. NFN

## 2023-06-22 DIAGNOSIS — R102 Pelvic and perineal pain: Secondary | ICD-10-CM | POA: Diagnosis not present

## 2023-06-22 DIAGNOSIS — M62838 Other muscle spasm: Secondary | ICD-10-CM | POA: Diagnosis not present

## 2023-06-22 DIAGNOSIS — N3941 Urge incontinence: Secondary | ICD-10-CM | POA: Diagnosis not present

## 2023-06-22 DIAGNOSIS — M6281 Muscle weakness (generalized): Secondary | ICD-10-CM | POA: Diagnosis not present

## 2023-06-22 DIAGNOSIS — K5900 Constipation, unspecified: Secondary | ICD-10-CM | POA: Diagnosis not present

## 2023-06-25 ENCOUNTER — Other Ambulatory Visit: Payer: Self-pay | Admitting: Family Medicine

## 2023-06-25 ENCOUNTER — Ambulatory Visit: Payer: PPO | Admitting: Gastroenterology

## 2023-06-25 ENCOUNTER — Encounter: Payer: Self-pay | Admitting: Gastroenterology

## 2023-06-25 VITALS — BP 110/62 | HR 58 | Ht 66.5 in | Wt 200.0 lb

## 2023-06-25 DIAGNOSIS — R1084 Generalized abdominal pain: Secondary | ICD-10-CM

## 2023-06-25 DIAGNOSIS — K581 Irritable bowel syndrome with constipation: Secondary | ICD-10-CM | POA: Diagnosis not present

## 2023-06-25 MED ORDER — DICYCLOMINE HCL 10 MG PO CAPS: 10.0000 mg | ORAL_CAPSULE | Freq: Three times a day (TID) | ORAL | 2 refills | Status: DC

## 2023-06-25 NOTE — Progress Notes (Signed)
 [name] [address] [place] [address] Phone: [phone] Subjective:   [name], am serving as a scribe for Dr. Antoine Primas.  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  We discussed potentially trying to keep it warm with different types of socks.  And changing potentially the gabapentin to Horizant and see if patient would make some improvement.  Discussed icing regimen and home exercises, discussed icing regimen and home exercises.  Follow-up again in 6 to 8 weeks otherwise.      Update 06/28/2023 CAROL LOFTIN is a 83 y.o. male coming in with complaint of LBP. Patient states write GI PA about stopping medications. Giving him diarrhea. Back hurts 90% of the time. R knee is giving way every now and again. R shoulder and R hip hurt as well. But doing better.       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"   CAD (coronary artery disease)    CABG 2001   Diverticulosis    DJD (degenerative joint disease)    BACK   GERD (gastroesophageal reflux disease)    H/O inguinal hernia repair 12/2018   History of gout    Hyperlipidemia    Hypertension    IBS (irritable bowel syndrome)    Obesity    OSA (obstructive sleep apnea)    USES CPAP    Pancreatitis ~ 1980   Peripheral vascular disease (HCC)    Pre-diabetes    Prostate cancer (HCC) 04/2010   Past Surgical History:  Procedure Laterality Date   ANTERIOR CERVICAL DECOMP/DISCECTOMY FUSION  05/26/2006   PLATE PLACED   BACK SURGERY     CARDIAC CATHETERIZATION  11/20/1999   CORONARY ARTERY BYPASS GRAFT  11/20/1999   SVG-RI1-RI2, SVG-OM,  SVG-dRCA   CORONARY STENT INTERVENTION N/A 04/13/2019   Procedure: CORONARY STENT INTERVENTION;  Surgeon: Kathleene Hazel, MD;  Location: [facility];  Service: Cardiovascular;  Laterality: N/A;   HERNIA REPAIR     INGUINAL HERNIA REPAIR Left 01/02/2019   Procedure: OPEN LEFT INGUINAL HERNIA REPAIR WITH MESH;  Surgeon: Luretha Murphy, MD;  Location: [facility];  Service: General;  Laterality: Left;   IR EMBO ARTERIAL NOT HEMORR HEMANG INC GUIDE ROADMAPPING  03/08/2023   IR RADIOLOGIST EVAL & MGMT  02/05/2023   IR RADIOLOGIST EVAL & MGMT  04/05/2023   IR RADIOLOGIST EVAL & MGMT  04/09/2023   IR RADIOLOGIST EVAL & MGMT  04/30/2023   KNEE ARTHROSCOPY Right 1992; 04/2008   LAPAROSCOPIC CHOLECYSTECTOMY  2007   LEFT HEART CATH AND CORS/GRAFTS ANGIOGRAPHY N/A 04/13/2019   Procedure: LEFT HEART CATH AND CORS/GRAFTS ANGIOGRAPHY;  Surgeon: Kathleene Hazel, MD;  Location: [facility];  Service: Cardiovascular;  Laterality: N/A;   NASAL SEPTUM SURGERY Right 2007   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   ROBOT ASSISTED LAPAROSCOPIC RADICAL PROSTATECTOMY  04/2010   THYROIDECTOMY, PARTIAL  1980   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   Procedure: LEFT TOTAL KNEE ARTHROPLASTY;  Surgeon: Marcene Corning, MD;  Location: [facility];  Service: Orthopedics;  Laterality: Left;   Social History   Socioeconomic History   Marital status: Married    Spouse name: Not on file   Number of children: Not on file   Years of education: Not on file   Highest education level: Associate degree: occupational, Scientist, product/process development, or vocational program  Occupational History   Occupation: retired    Associate Professor: RETIRED  Tobacco Use   Smoking status: Former    Current packs/day: 0.00    Average packs/day: 1 pack/day for 35.0 years (35.0 ttl pk-yrs)    Types: Cigarettes    Start date: 06/16/1957    Quit date: 06/16/1992    Years since quitting: 31.0   Smokeless tobacco: Never  Vaping Use   Vaping status: Never  Used  Substance and Sexual Activity   Alcohol use: Yes    Alcohol/week: 2.0 standard drinks of alcohol    Types: 2 Shots of liquor per week    Comment: occ   Drug use: No   Sexual activity: Not Currently    Partners: Female  Other Topics Concern   Not on file  Social History Narrative   Married 42 years in 2015. 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      Hobbies: poker, movies and tv, staying active      Right handed   2 level home with steps he uses   Social Drivers of Corporate investment banker Strain: Low Risk  (06/10/2023)   Overall Financial Resource Strain (CARDIA)    Difficulty of Paying Living Expenses: Not hard at all  Food Insecurity: No Food Insecurity (06/10/2023)   Hunger Vital Sign    Worried About Running Out of Food in the Last Year: Never true    Ran Out of Food in the Last Year: Never true  Transportation Needs: No Transportation Needs (06/10/2023)   PRAPARE - Administrator, Civil Service (Medical): No    Lack of Transportation (Non-Medical): No  Physical Activity: Insufficiently Active (06/10/2023)   Exercise Vital Sign    Days of Exercise per Week: 4 days    Minutes of Exercise per Session: 10 min  Stress: No Stress Concern Present (06/10/2023)   [employer] of Occupational Health - Occupational Stress Questionnaire    Feeling of Stress : Not at all  Recent Concern: Stress - Stress Concern Present (05/21/2023)   [employer] of Occupational Health - Occupational Stress Questionnaire    Feeling of Stress : To some extent  Social Connections: Moderately Isolated (06/10/2023)   Social Connection and Isolation Panel [NHANES]    Frequency of Communication with Friends and Family: More than three times a week    Frequency of Social Gatherings with Friends and Family: More than three times a week    Attends Religious Services: Never    Database administrator or  Organizations: No    Attends Engineer, structural: Never    Marital Status: Married   Allergies  Allergen Reactions   Codeine Other (See Comments)    Trouble breathing   Lexapro [Escitalopram]     Dizziness   Bromfed Nausea And Vomiting   Clarithromycin Other (See Comments)     gastritis   Cymbalta [Duloxetine Hcl]     Abdominal pain and later chest pain as well as worsening fatigue    Doxycycline Hyclate Nausea And Vomiting   Modafinil Other (See  Comments)     anxiety-nervousness   Oxycodone-Acetaminophen Itching   Promethazine Hcl Other (See Comments)     fainting   Quinolones Nausea Only    Cipro "felt real bad"   Telithromycin Nausea And Vomiting   Tramadol Nausea Only   Benadryl [Diphenhydramine] Other (See Comments)    Pt told not to take because of bypass surgery   Hydrocodone-Acetaminophen Rash   Repatha [Evolocumab]     Weak, worn out   Statins Other (See Comments)    Leg cramps and makes patient feel bad. Per pt tried multiple in years past   Sulfonamide Derivatives Rash   Family History  Problem Relation Age of Onset   Heart disease Mother    Hyperlipidemia Mother    Heart disease Father    Hyperlipidemia Father    Diabetes Brother    Colon cancer Neg Hx    Pancreatic cancer Neg Hx    Esophageal cancer Neg Hx    Stomach cancer Neg Hx    Liver cancer Neg Hx      Current Outpatient Medications (Cardiovascular):    Alirocumab (PRALUENT) 150 MG/ML SOAJ, INJECT 1 DOSE INTO THE SKIN EVERY 14 (FOURTEEN) DAYS.   amLODipine (NORVASC) 5 MG tablet, TAKE 1 TABLET (5 MG TOTAL) BY MOUTH DAILY.   EPINEPHrine 0.3 mg/0.3 mL IJ SOAJ injection, Inject 0.3 mg into the muscle as needed for anaphylaxis. for allergic reaction   ezetimibe (ZETIA) 10 MG tablet, TAKE 1 TABLET BY MOUTH EVERY DAY   hydrochlorothiazide (HYDRODIURIL) 25 MG tablet, TAKE 1 TABLET BY MOUTH EVERY DAY   nitroGLYCERIN (NITROSTAT) 0.4 MG SL tablet, Place 1 tablet (0.4 mg total) under the tongue  every 5 (five) minutes as needed for chest pain. (Patient not taking: Reported on 06/25/2023)   valsartan (DIOVAN) 320 MG tablet, TAKE 1 TABLET BY MOUTH EVERY DAY  Current Outpatient Medications (Respiratory):    albuterol (PROAIR HFA) 108 (90 Base) MCG/ACT inhaler, Inhale 1-2 puffs into the lungs every 6 (six) hours as needed for wheezing or shortness of breath. (Patient not taking: Reported on 06/25/2023)   fluticasone (FLONASE) 50 MCG/ACT nasal spray, Place 1 spray into both nostrils daily.   Fluticasone Furoate (ARNUITY ELLIPTA) 100 MCG/ACT AEPB, Inhale 1 Inhalation into the lungs daily. (Patient not taking: Reported on 06/25/2023)  Current Outpatient Medications (Analgesics):    acetaminophen (TYLENOL) 500 MG tablet, Take 1,000 mg by mouth every 6 (six) hours as needed for moderate pain or headache.   aspirin EC 81 MG tablet, Take 1 tablet (81 mg total) by mouth 2 (two) times daily after a meal.  Current Outpatient Medications (Hematological):    Cyanocobalamin (B-12) 2500 MCG TABS, Take 1,250 mcg by mouth daily.   folic acid (FOLVITE) 800 MCG tablet, Take 800 mcg by mouth daily.   Current Outpatient Medications (Other):    cholecalciferol (VITAMIN D3) 25 MCG (1000 UNIT) tablet, Take 1,000 Units by mouth daily.   diazepam (VALIUM) 5 MG tablet, TAKE 1 TABLET (5 MG TOTAL) BY MOUTH AT BEDTIME AS NEEDED (SLEEP).   dicyclomine (BENTYL) 10 MG capsule, Take 1 capsule (10 mg total) by mouth in the morning, at noon, and at bedtime.   diphenhydramine-acetaminophen (TYLENOL PM) 25-500 MG TABS tablet, Take 1 tablet by mouth at bedtime as needed.   gabapentin (NEURONTIN) 300 MG capsule, Take 2 cap in AM, 1 cap at noon, 3 caps at bedtime   Glucos-Chond-Hyal Ac-Ca Fructo (MOVE FREE JOINT HEALTH ADVANCE PO), Take 1 tablet by mouth daily.  ketoconazole (NIZORAL) 2 % cream, Apply 1 Application topically 2 (two) times daily as needed.   Lidocaine HCl (ASPERCREME LIDOCAINE) 4 % CREA, Apply topically in the  morning and at bedtime.   lubiprostone (AMITIZA) 8 MCG capsule, TAKE 1 CAPSULE BY MOUTH 2 TIMES DAILY WITH A MEAL. (Patient not taking: Reported on 06/25/2023)   mirabegron ER (MYRBETRIQ) 50 MG TB24 tablet, Take 1 tablet (50 mg total) by mouth daily.   pantoprazole (PROTONIX) 40 MG tablet, TAKE 1 TABLET BY MOUTH EVERY DAY   polyethylene glycol powder (GLYCOLAX/MIRALAX) powder, Take 17 g by mouth daily. (Patient taking differently: Take 17 g by mouth once.)   Probiotic Product (PROBIOTIC PO), Take by mouth in the morning.   Reviewed prior external information including notes and imaging from  primary care provider As well as notes that were available from care everywhere and other healthcare systems.  Past medical history, social, surgical and family history all reviewed in electronic medical record.  No pertanent information unless stated regarding to the chief complaint.   Review of Systems:  No headache, visual changes, nausea, vomiting, diarrhea, constipation, dizziness, abdominal pain, skin rash, fevers, chills, night sweats, weight loss, swollen lymph nodes,  chest pain, shortness of breath, mood changes. POSITIVE muscle aches, body aches, joint swelling  Objective  Blood pressure 126/84, pulse 70, height 5\' 6"  (1.676 m), weight 200 lb (90.7 kg), SpO2 96%.   General: No apparent distress alert and oriented x3 mood and affect normal, dressed appropriately.  HEENT: Pupils equal, extraocular movements intact  Respiratory: Patient's speak in full sentences and does not appear short of breath  Cardiovascular: No lower extremity edema, non tender, no erythema  Patient does have some tightness noted in the back.  In sitting and does seem to be a little bit uncomfortable.  Deferred the rest of the exam today.    Impression and Recommendations:    The above documentation has been reviewed and is accurate and complete Judi Saa, DO

## 2023-06-25 NOTE — Progress Notes (Signed)
 Chief Complaint: Follow-up of IBS-C Primary GI MD: Dr. Rhea Belton  HPI: 83 year old male history of IBS and others as listed below presents for follow-up.  Last seen 01/2019 for for IBS with persistent symptoms of bloating, gas, abdominal pain, alternating constipation/loose stools despite MiraLAX, Benefiber, and probiotics.  Extensive negative workup including hydrogen breath test, CT scan, colonoscopy.  At last visit patient was recommended to discontinue MiraLAX/Benefiber and start on Linzess.  He then did a bowel purge and enema for pressure in his rectum with relief.  Linzess was too expensive so we changed him to Amitiza 8 mcg twice daily  -------------------TODAY--------------------  Patient states Linzess caused him abdominal cramping.  He took the Amitiza and states "he did not like it" as it did not provide him adequate bowel movements or abdominal pain relief.  He is currently on stool softeners and MiraLAX as needed and starting to have bowel movements on a more regular basis.  States he has about 2 a day which has been helping.  He continues to have some intermittent abdominal cramping which has been bothersome for him.   PREVIOUS GI WORKUP   05/07/2021 Colonoscopy for abdominal pain and bloating - One 4 mm polyp in the cecum, removed with a cold snare. Resected and retrieved.  - One 5 mm polyp in the descending colon, removed with a cold snare. Resected and retrieved.  - One 4 mm polyp in the proximal rectum, removed with a cold snare. Resected and retrieved.  - One 9 mm polyp in the mid rectum, removed with a cold snare. Resected and retrieved. - Diverticulosis in the sigmoid colon, in the descending colon and in the ascending colon. - Internal hemorrhoids. - no repeat recommended   Diagnosis 1. Surgical [P], colon, descending and cecum, polyp (2) - TUBULAR ADENOMA, ONE FRAGMENT. NO HIGH GRADE DYSPLASIA OR MALIGNANCY. - POLYPOID COLONIC MUCOSA, ONE FRAGMENT. NO DYSPLASIA OR  MALIGNANCY. 2. Surgical [P], colon, rectum, polyp (2) - TUBULAR ADENOMA, TWO FRAGMENTS. NO HIGH GRADE DYSPLASIA OR MALIGNANCY. - HYPERPLASTIC POLYP, TWO FRAGMENTS. NO DYSPLASIA OR MALIGNANCY.   Upper endoscopy in September 2010 showing a hiatal hernia and otherwise normal exam and also had colonoscopy at that same time showing moderate diverticulosis throughout the colon, no polyps and was to have a 10 year interval follow-up.   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"   CAD (coronary artery disease)    CABG 2001   Diverticulosis    DJD (degenerative joint disease)    BACK   GERD (gastroesophageal reflux disease)    H/O inguinal hernia repair 12/2018   History of gout    Hyperlipidemia    Hypertension    IBS (irritable bowel syndrome)    Obesity    OSA (obstructive sleep apnea)    USES CPAP    Pancreatitis ~ 1980   Peripheral vascular disease (HCC)    Pre-diabetes    Prostate cancer (HCC) 04/2010    Past Surgical History:  Procedure Laterality Date   ANTERIOR CERVICAL DECOMP/DISCECTOMY FUSION  05/26/2006   PLATE PLACED   BACK SURGERY     CARDIAC CATHETERIZATION  11/20/1999   CORONARY ARTERY BYPASS GRAFT  11/20/1999   SVG-RI1-RI2, SVG-OM, SVG-dRCA   CORONARY STENT INTERVENTION N/A 04/13/2019  Procedure: CORONARY STENT INTERVENTION;  Surgeon: Kathleene Hazel, MD;  Location: [facility];  Service: Cardiovascular;  Laterality: N/A;   HERNIA REPAIR     INGUINAL HERNIA REPAIR Left 01/02/2019   Procedure: OPEN LEFT INGUINAL HERNIA REPAIR WITH MESH;  Surgeon: Luretha Murphy, MD;  Location: [facility];  Service: General;  Laterality: Left;   IR EMBO ARTERIAL NOT HEMORR HEMANG INC GUIDE ROADMAPPING  03/08/2023   IR RADIOLOGIST EVAL & MGMT  02/05/2023   IR  RADIOLOGIST EVAL & MGMT  04/05/2023   IR RADIOLOGIST EVAL & MGMT  04/09/2023   IR RADIOLOGIST EVAL & MGMT  04/30/2023   KNEE ARTHROSCOPY Right 1992; 04/2008   LAPAROSCOPIC CHOLECYSTECTOMY  2007   LEFT HEART CATH AND CORS/GRAFTS ANGIOGRAPHY N/A 04/13/2019   Procedure: LEFT HEART CATH AND CORS/GRAFTS ANGIOGRAPHY;  Surgeon: Kathleene Hazel, MD;  Location: [facility];  Service: Cardiovascular;  Laterality: N/A;   NASAL SEPTUM SURGERY Right 2007   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   ROBOT ASSISTED LAPAROSCOPIC RADICAL PROSTATECTOMY  04/2010   THYROIDECTOMY, PARTIAL  1980   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   Procedure: LEFT TOTAL KNEE ARTHROPLASTY;  Surgeon: Marcene Corning, MD;  Location: [facility];  Service: Orthopedics;  Laterality: Left;    Current Outpatient Medications  Medication Sig Dispense Refill   acetaminophen (TYLENOL) 500 MG tablet Take 1,000 mg by mouth every 6 (six) hours as needed for moderate pain or headache.     albuterol (PROAIR HFA) 108 (90 Base) MCG/ACT inhaler Inhale 1-2 puffs into the lungs every 6 (six) hours as needed for wheezing or shortness of breath. 18 g 5   Alirocumab (PRALUENT) 150 MG/ML SOAJ INJECT 1 DOSE INTO THE SKIN EVERY 14 (FOURTEEN) DAYS. 6 mL 3   amLODipine (NORVASC) 5 MG tablet TAKE 1 TABLET (5 MG TOTAL) BY MOUTH DAILY. 90 tablet 3   aspirin EC 81 MG tablet Take 1 tablet (81 mg total) by mouth 2 (two) times daily after a meal. 30 tablet 11   cholecalciferol (VITAMIN D3) 25 MCG (1000 UNIT) tablet Take 1,000 Units by mouth daily.     Cyanocobalamin (B-12) 2500 MCG TABS Take 1,250 mcg by mouth daily.     diazepam (VALIUM) 5 MG tablet TAKE 1 TABLET (5 MG TOTAL) BY MOUTH AT BEDTIME AS NEEDED (SLEEP). 30 tablet 0   diphenhydramine-acetaminophen (TYLENOL PM) 25-500 MG TABS tablet Take 1 tablet by mouth at bedtime as needed.     EPINEPHrine 0.3 mg/0.3 mL IJ SOAJ injection Inject 0.3 mg into the muscle as needed for anaphylaxis. for allergic reaction  (Patient not taking: Reported on 06/10/2023)     ezetimibe (ZETIA) 10 MG tablet TAKE 1 TABLET BY MOUTH EVERY DAY 90 tablet 2   fluticasone (FLONASE) 50 MCG/ACT nasal spray Place 1 spray into both nostrils daily.     Fluticasone Furoate (ARNUITY ELLIPTA) 100 MCG/ACT AEPB Inhale 1 Inhalation into the lungs daily. (Patient not taking: Reported on 06/10/2023)     folic acid (FOLVITE) 800 MCG tablet Take 800 mcg by mouth daily.      gabapentin (NEURONTIN) 300 MG capsule Take 2 cap in AM, 1 cap at noon, 3 caps at bedtime 540 capsule 3   Glucos-Chond-Hyal Ac-Ca Fructo (MOVE FREE JOINT HEALTH ADVANCE PO) Take 1 tablet by mouth daily.     hydrochlorothiazide (HYDRODIURIL) 25 MG tablet TAKE 1 TABLET BY MOUTH EVERY DAY 90 tablet 2   ketoconazole (NIZORAL) 2 % cream Apply 1 Application  topically 2 (two) times daily as needed.     Lidocaine HCl (ASPERCREME LIDOCAINE) 4 % CREA Apply topically in the morning and at bedtime.     lubiprostone (AMITIZA) 8 MCG capsule TAKE 1 CAPSULE BY MOUTH 2 TIMES DAILY WITH A MEAL. 180 capsule 1   mirabegron ER (MYRBETRIQ) 50 MG TB24 tablet Take 1 tablet (50 mg total) by mouth daily. 90 tablet 1   nitroGLYCERIN (NITROSTAT) 0.4 MG SL tablet Place 1 tablet (0.4 mg total) under the tongue every 5 (five) minutes as needed for chest pain. 30 tablet 5   pantoprazole (PROTONIX) 40 MG tablet TAKE 1 TABLET BY MOUTH EVERY DAY 90 tablet 3   polyethylene glycol powder (GLYCOLAX/MIRALAX) powder Take 17 g by mouth daily. (Patient taking differently: Take 17 g by mouth once.) 255 g 0   Probiotic Product (PROBIOTIC PO) Take by mouth in the morning. (Patient not taking: Reported on 06/10/2023)     valsartan (DIOVAN) 320 MG tablet TAKE 1 TABLET BY MOUTH EVERY DAY 90 tablet 3   No current facility-administered medications for this visit.    Allergies as of 06/25/2023 - Review Complete 06/10/2023  Allergen Reaction Noted   Codeine Other (See Comments)    Lexapro [escitalopram]  08/21/2020    Bromfed Nausea And Vomiting 12/14/2006   Clarithromycin Other (See Comments) 10/12/2006   Cymbalta [duloxetine hcl]  01/01/2022   Doxycycline hyclate Nausea And Vomiting 12/14/2006   Modafinil Other (See Comments) 12/14/2006   Oxycodone-acetaminophen Itching 11/25/2016   Promethazine hcl Other (See Comments) 12/14/2006   Quinolones Nausea Only 09/20/2014   Telithromycin Nausea And Vomiting 12/14/2006   Tramadol Nausea Only 07/29/2020   Benadryl [diphenhydramine] Other (See Comments) 02/06/2014   Hydrocodone-acetaminophen Rash 12/14/2006   Repatha [evolocumab]  02/16/2022   Statins Other (See Comments) 04/14/2019   Sulfonamide derivatives Rash     Family History  Problem Relation Age of Onset   Heart disease Mother    Hyperlipidemia Mother    Heart disease Father    Hyperlipidemia Father    Diabetes Brother    Colon cancer Neg Hx    Pancreatic cancer Neg Hx    Esophageal cancer Neg Hx    Stomach cancer Neg Hx    Liver cancer Neg Hx     Social History   Socioeconomic History   Marital status: Married    Spouse name: Not on file   Number of children: Not on file   Years of education: Not on file   Highest education level: Associate degree: occupational, Scientist, product/process development, or vocational program  Occupational History   Occupation: retired    Associate Professor: RETIRED  Tobacco Use   Smoking status: Former    Current packs/day: 0.00    Average packs/day: 1 pack/day for 35.0 years (35.0 ttl pk-yrs)    Types: Cigarettes    Start date: 06/16/1957    Quit date: 06/16/1992    Years since quitting: 31.0   Smokeless tobacco: Never  Vaping Use   Vaping status: Never Used  Substance and Sexual Activity   Alcohol use: Yes    Alcohol/week: 2.0 standard drinks of alcohol    Types: 2 Shots of liquor per week    Comment: occ   Drug use: No   Sexual activity: Not Currently    Partners: Female  Other Topics Concern   Not on file  Social History Narrative   Married 42 years in 2015. No kids  (mumps at age 52)      Retired from  management in IT.  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      Hobbies: poker, movies and tv, staying active      Right handed   2 level home with steps he uses   Social Drivers of Corporate investment banker Strain: Low Risk  (06/10/2023)   Overall Financial Resource Strain (CARDIA)    Difficulty of Paying Living Expenses: Not hard at all  Food Insecurity: No Food Insecurity (06/10/2023)   Hunger Vital Sign    Worried About Running Out of Food in the Last Year: Never true    Ran Out of Food in the Last Year: Never true  Transportation Needs: No Transportation Needs (06/10/2023)   PRAPARE - Administrator, Civil Service (Medical): No    Lack of Transportation (Non-Medical): No  Physical Activity: Insufficiently Active (06/10/2023)   Exercise Vital Sign    Days of Exercise per Week: 4 days    Minutes of Exercise per Session: 10 min  Stress: No Stress Concern Present (06/10/2023)   [employer] of Occupational Health - Occupational Stress Questionnaire    Feeling of Stress : Not at all  Recent Concern: Stress - Stress Concern Present (05/21/2023)   [employer] of Occupational Health - Occupational Stress Questionnaire    Feeling of Stress : To some extent  Social Connections: Moderately Isolated (06/10/2023)   Social Connection and Isolation Panel [NHANES]    Frequency of Communication with Friends and Family: More than three times a week    Frequency of Social Gatherings with Friends and Family: More than three times a week    Attends Religious Services: Never    Database administrator or Organizations: No    Attends Banker Meetings: Never    Marital Status: Married  Catering manager Violence: Not At Risk (06/10/2023)   Humiliation, Afraid, Rape, and Kick questionnaire    Fear of Current or Ex-Partner: No    Emotionally Abused: No    Physically Abused: No    Sexually Abused: No    Review of Systems:     Constitutional: No weight loss, fever, chills, weakness or fatigue HEENT: Eyes: No change in vision               Ears, Nose, Throat:  No change in hearing or congestion Skin: No rash or itching Cardiovascular: No chest pain, chest pressure or palpitations   Respiratory: No SOB or cough Gastrointestinal: See HPI and otherwise negative Genitourinary: No dysuria or change in urinary frequency Neurological: No headache, dizziness or syncope Musculoskeletal: No new muscle or joint pain Hematologic: No bleeding or bruising Psychiatric: No history of depression or anxiety    Physical Exam:  Vital signs: There were no vitals taken for this visit.  Constitutional: NAD, Well developed, Well nourished, alert and cooperative Head:  Normocephalic and atraumatic. Eyes:   PEERL, EOMI. No icterus. Conjunctiva pink. Respiratory: Respirations even and unlabored. Lungs clear to auscultation bilaterally.   No wheezes, crackles, or rhonchi.  Cardiovascular:  Regular rate and rhythm. No peripheral edema, cyanosis or pallor.  Gastrointestinal:  Soft, nondistended, nontender. No rebound or guarding. Normal bowel sounds. No appreciable masses or hepatomegaly. Rectal:  Not performed.  Msk:  Symmetrical without gross deformities. Without edema, no deformity or joint abnormality.  Neurologic:  Alert and  oriented x4;  grossly normal neurologically.  Skin:   Dry and intact without significant lesions or rashes. Psychiatric: Oriented to person, place and time. Demonstrates  good judgement and reason without abnormal affect or behaviors.  Physical Exam    RELEVANT LABS AND IMAGING: CBC    Component Value Date/Time   WBC 7.9 05/25/2023 0954   RBC 4.55 05/25/2023 0954   HGB 14.5 05/25/2023 0954   HGB 14.8 12/23/2021 1552   HCT 42.0 05/25/2023 0954   HCT 42.8 12/23/2021 1552   PLT 256.0 05/25/2023 0954   PLT 274 12/23/2021 1552   MCV 92.2 05/25/2023 0954   MCV 89 12/23/2021 1552   MCH 31.4  03/18/2023 0725   MCHC 34.6 05/25/2023 0954   RDW 12.8 05/25/2023 0954   RDW 11.9 12/23/2021 1552   LYMPHSABS 1.1 05/25/2023 0954   MONOABS 1.0 05/25/2023 0954   EOSABS 0.6 05/25/2023 0954   BASOSABS 0.1 05/25/2023 0954    CMP     Component Value Date/Time   NA 138 05/25/2023 0954   NA 140 12/23/2021 1552   K 3.7 05/25/2023 0954   CL 102 05/25/2023 0954   CO2 28 05/25/2023 0954   GLUCOSE 96 05/25/2023 0954   BUN 22 05/25/2023 0954   BUN 21 12/23/2021 1552   CREATININE 1.24 05/25/2023 0954   CREATININE 1.33 (H) 02/23/2020 0933   CALCIUM 10.0 05/25/2023 0954   PROT 6.7 05/25/2023 0954   PROT 6.7 04/16/2020 0845   ALBUMIN 4.0 05/25/2023 0954   ALBUMIN 4.3 04/16/2020 0845   AST 18 05/25/2023 0954   ALT 19 05/25/2023 0954   ALKPHOS 51 05/25/2023 0954   BILITOT 0.7 05/25/2023 0954   BILITOT 0.8 04/16/2020 0845   GFRNONAA 55 (L) 03/18/2023 0725   GFRNONAA 51 (L) 02/23/2020 0933   GFRAA 59 (L) 02/23/2020 0933     Assessment/Plan:   IBS-C Has failed previous regimen of MiraLAX, Benefiber, probiotics, Linzess, Amitiza.  Mild relief with stool softeners and MiraLAX as needed but he is willing to try another prescription medication if he can improve his bowel movements.  Extensive negative workup including negative hydrogen breath test, CT scan, colonoscopy.  Also failed IBgard.  Currently having 2 stools per day.  Discussed he could continue on stool softeners and MiraLAX if he feels this is adequate but he would like to try another prescription if possible. - Provided samples of Ibsrela 50 mg twice daily - Dicyclomine 10 mg 4 times daily prn (advised of anticholinergic side effects) -Continue to increase water, increase fiber, increase exercise - Follow-up 8 to 12 weeks - Please MyChart message if worsening symptoms   This visit required 32 minutes of patient care (this includes precharting, chart review, review of results, face-to-face time used for counseling as well as  treatment plan and follow-up. The patient was provided an opportunity to ask questions and all were answered. The patient agreed with the plan and demonstrated an understanding of the instructions.   Boone Master, PA-C [facility] 06/25/2023, 10:03 AM  Cc: Shelva Majestic, MD

## 2023-06-25 NOTE — Patient Instructions (Signed)
 We have given you samples of the following medication to take: IBSRELA 50 mg take twice daily.   We have sent the following medications to your pharmacy for you to pick up at your convenience: dicyclomine 10 mg  Follow up in 8-12 weeks.   _______________________________________________________  If your blood pressure at your visit was 140/90 or greater, please contact your primary care physician to follow up on this.  _______________________________________________________  If you are age 81 or older, your body mass index should be between 23-30. Your Body mass index is 31.8 kg/m. If this is out of the aforementioned range listed, please consider follow up with your Primary Care Provider.  If you are age 10 or younger, your body mass index should be between 19-25. Your Body mass index is 31.8 kg/m. If this is out of the aformentioned range listed, please consider follow up with your Primary Care Provider.   ________________________________________________________  The [facility] providers would like to encourage you to use [facility] to communicate with providers for non-urgent requests or questions.  Due to long hold times on the telephone, sending your provider a message by [facility] may be a faster and more efficient way to get a response.  Please allow 48 business hours for a response.  Please remember that this is for non-urgent requests.  _______________________________________________________

## 2023-06-28 ENCOUNTER — Ambulatory Visit: Payer: PPO | Admitting: Family Medicine

## 2023-06-28 ENCOUNTER — Encounter: Payer: Self-pay | Admitting: Family Medicine

## 2023-06-28 VITALS — BP 126/84 | HR 70 | Ht 66.0 in | Wt 200.0 lb

## 2023-06-28 DIAGNOSIS — M51362 Other intervertebral disc degeneration, lumbar region with discogenic back pain and lower extremity pain: Secondary | ICD-10-CM | POA: Diagnosis not present

## 2023-06-28 NOTE — Patient Instructions (Addendum)
 Good to see you! I'll ask cardiologist about Journvx See you again in 2-3 months

## 2023-06-28 NOTE — Assessment & Plan Note (Signed)
 Severe degenerative disc disease noted.  Discussed icing regimen and home exercises.  Discussed which activities throughout the can be beneficial in which ones can be harmful.  We have tried multiple different medications and patient has multiple different allergies or contraindications.  I did discuss a new potential medication which is a peripheral sodium channel blocker.  Would like to see what his cardiology thinks of this medicine if it would be beneficial.  If we could help him with some of the peripheral pain and neuropathy as well as his low back pain we could potentially improve his quality of life.  Will send a note to his cardiologist to discuss further with the potential risk of arrhythmia is there even though it is low.  Patient otherwise will follow-up with me again in 2 to 3 months.  Total time with patient as well as reviewing outside records 31 minutes

## 2023-07-01 ENCOUNTER — Ambulatory Visit: Admitting: Family Medicine

## 2023-07-01 ENCOUNTER — Ambulatory Visit (INDEPENDENT_AMBULATORY_CARE_PROVIDER_SITE_OTHER): Admitting: Family Medicine

## 2023-07-01 ENCOUNTER — Encounter: Payer: Self-pay | Admitting: Family Medicine

## 2023-07-01 VITALS — BP 120/68 | HR 58 | Temp 97.3°F | Ht 66.0 in | Wt 198.6 lb

## 2023-07-01 DIAGNOSIS — I2510 Atherosclerotic heart disease of native coronary artery without angina pectoris: Secondary | ICD-10-CM | POA: Diagnosis not present

## 2023-07-01 DIAGNOSIS — I10 Essential (primary) hypertension: Secondary | ICD-10-CM | POA: Diagnosis not present

## 2023-07-01 DIAGNOSIS — B9689 Other specified bacterial agents as the cause of diseases classified elsewhere: Secondary | ICD-10-CM

## 2023-07-01 DIAGNOSIS — E785 Hyperlipidemia, unspecified: Secondary | ICD-10-CM | POA: Diagnosis not present

## 2023-07-01 DIAGNOSIS — J329 Chronic sinusitis, unspecified: Secondary | ICD-10-CM | POA: Diagnosis not present

## 2023-07-01 MED ORDER — AMOXICILLIN-POT CLAVULANATE 875-125 MG PO TABS: 1.0000 | ORAL_TABLET | Freq: Two times a day (BID) | ORAL | 0 refills | Status: AC

## 2023-07-01 NOTE — Progress Notes (Signed)
 Phone [phone] In person visit   Subjective:   Joshua [name] is a 83 y.o. year old very pleasant male patient who presents for/with See problem oriented charting Chief Complaint  Patient presents with   Diarrhea    Pt c/o 3 rounds of diarrhea x 3 days along with nausea and decreased appetite. Has not tried anything otc.   Nasal Congestion    AND COUGH FOR 3 WEEKS   Past Medical History-  Patient Active Problem List   Diagnosis Date Noted   Scrotal pain 02/11/2023    Priority: High   Coronary atherosclerosis 10/12/2006    Priority: High   Overactive bladder 11/24/2019    Priority: Medium    Hyperglycemia 10/07/2017    Priority: Medium    Gout 11/20/2016    Priority: Medium    CKD (chronic kidney disease), stage III (HCC) 10/23/2013    Priority: Medium    Neuropathy (HCC) 08/21/2013    Priority: Medium    History of bladder cancer 04/01/2010    Priority: Medium    Essential hypertension 02/21/2007    Priority: Medium    Hyperlipidemia 10/12/2006    Priority: Medium    Asthma 10/12/2006    Priority: Medium    Aortic atherosclerosis (HCC) 05/05/2018    Priority: Low   Senile purpura (HCC) 10/07/2017    Priority: Low   DJD (degenerative joint disease)     Priority: Low   Arthritis     Priority: Low   Neck pain 02/08/2017    Priority: Low   Venous insufficiency 12/29/2016    Priority: Low   Ganglion cyst of tendon sheath of right hand 12/29/2016    Priority: Low   Obesity, Class I, BMI 30-34.9 10/03/2015    Priority: Low   Allergic rhinitis 05/31/2014    Priority: Low   Fatigue 11/09/2013    Priority: Low   Dizziness 08/23/2013    Priority: Low   Chronic bilateral low back pain with left-sided sciatica 10/09/2011    Priority: Low   Localized osteoarthrosis, lower leg 10/04/2009    Priority: Low   Tinnitus 05/05/2007    Priority: Low   OSA (obstructive sleep apnea) 11/02/2006    Priority: Low   GERD 10/12/2006    Priority: Low   De Quervain's  tenosynovitis, right 10/28/2022    Priority: 1.   Degenerative arthritis of right knee 06/08/2022    Priority: 1.   Left hamstring injury 04/21/2022    Priority: 1.   Degenerative cervical disc 01/12/2022    Priority: 1.   Greater trochanteric bursitis of right hip 02/17/2021    Priority: 1.   Primary osteoarthritis of left knee 06/18/2020    Priority: 1.   Trigger thumb, left thumb 01/24/2020    Priority: 1.   Greater trochanteric bursitis of left hip 10/27/2018    Priority: 1.   Degenerative disc disease, lumbar 10/27/2018    Priority: 1.   Abdominal muscle strain, initial encounter 06/16/2018    Priority: 1.   Weakness of left leg 06/21/2017    Priority: 1.   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     albuterol (PROAIR HFA) 108 (90 Base) MCG/ACT inhaler Inhale 1-2 puffs into the lungs every 6 (six) hours as needed for wheezing or shortness of breath. 18 g 5   Alirocumab (PRALUENT) 150 MG/ML SOAJ INJECT 1 DOSE INTO THE SKIN EVERY 14 (FOURTEEN) DAYS. 6 mL 3   amLODipine (NORVASC) 5 MG tablet TAKE 1 TABLET (5 MG TOTAL) BY MOUTH DAILY. 90 tablet 3   amoxicillin-clavulanate (AUGMENTIN) 875-125 MG tablet Take 1 tablet by mouth 2 (two) times daily for 7 days. 14 tablet 0   aspirin EC 81 MG tablet Take 1 tablet (81 mg total) by mouth 2 (two) times daily after a meal. 30 tablet 11   cholecalciferol (VITAMIN D3) 25 MCG (1000 UNIT) tablet Take 1,000  Units by mouth daily.     Cyanocobalamin (B-12) 2500 MCG TABS Take 1,250 mcg by mouth daily.     diazepam (VALIUM) 5 MG tablet TAKE 1 TABLET (5 MG TOTAL) BY MOUTH AT BEDTIME AS NEEDED (SLEEP). 30 tablet 0   dicyclomine (BENTYL) 10 MG capsule Take 1 capsule (10 mg total) by mouth in the morning, at noon, and at bedtime. 90 capsule 2   EPINEPHrine 0.3 mg/0.3 mL IJ SOAJ injection Inject 0.3 mg into the muscle as needed for anaphylaxis. for allergic reaction     ezetimibe (ZETIA) 10 MG tablet TAKE 1 TABLET BY MOUTH EVERY DAY 90 tablet 2   fluticasone (FLONASE) 50 MCG/ACT nasal spray Place 1 spray into both nostrils daily.     folic acid (FOLVITE) 800 MCG tablet Take 800 mcg by mouth daily.      gabapentin (NEURONTIN) 300 MG capsule Take 2 cap in AM, 1 cap at noon, 3 caps at bedtime 540 capsule 3   Glucos-Chond-Hyal Ac-Ca Fructo (MOVE FREE JOINT HEALTH ADVANCE PO) Take 1 tablet by mouth daily.     hydrochlorothiazide (HYDRODIURIL) 25 MG tablet TAKE 1 TABLET BY MOUTH EVERY DAY 90 tablet 2   ketoconazole (NIZORAL) 2 % cream Apply 1 Application topically 2 (two) times daily as needed.     Lidocaine HCl (ASPERCREME LIDOCAINE) 4 % CREA Apply topically in the morning and at bedtime.     mirabegron ER (MYRBETRIQ) 50 MG TB24 tablet Take 1 tablet (50 mg total) by mouth daily. 90 tablet 1   nitroGLYCERIN (NITROSTAT) 0.4 MG SL tablet Place 1 tablet (0.4 mg total) under the tongue every 5 (five) minutes as needed for chest pain. 30 tablet 5   pantoprazole (PROTONIX) 40 MG tablet TAKE 1 TABLET BY MOUTH EVERY DAY 90 tablet 3   polyethylene glycol powder (GLYCOLAX/MIRALAX) powder Take 17 g by mouth daily. (Patient taking differently: Take 17 g by mouth once.) 255 g 0   Probiotic Product (PROBIOTIC PO) Take by mouth in the morning.     valsartan (DIOVAN) 320 MG tablet TAKE 1 TABLET BY MOUTH EVERY DAY 90 tablet 3   No current facility-administered medications for this visit.     Objective:  BP 120/68   Pulse (!)  58   Temp (!) 97.3 F (36.3 C)   Ht 5\' 6"  (1.676 m)   Wt 198 lb 9.6 oz (90.1 kg)   SpO2 97%   BMI 32.05 kg/m  Gen: NAD, resting comfortably Frontal sinus tenderness noted, no maxillary sinus tenderness.  Nasal turbinates erythematous with yellow discharge.  Oropharynx largely normal other than  some drainage in the pharynx CV: RRR no murmurs rubs or gallops Lungs: CTAB no crackles, wheeze, rhonchi Abdomen: soft/nontender/nondistended/normal bowel sounds. No rebound or guarding.  Ext: no edema Skin: warm, dry     Assessment and Plan   # Social update-some stress at home with renovating kitchen-he feels like he has not been as sharp and has been more forgetful of names during this time-his memory testing within a month was reassuring-we opted to monitor only-he may work on expanding his brain more- consider reading [employer]ia Client book  # Cough/nasal congestion S: Some gurgling/mucus in throat as well as nasal congestion for 3 weeks. Some cough. Wife had bacterial sinusitis and took azithromycin recently. Didn't tolerate CPAP- stopped using lately and actually seemed perkier -sleeps 2-3 hours a day- but wakes up in the night- encouraged to stop the naps but he declines. No shortness of breath with this. Some pressure in chest (but has been lifting some heavy things and hurts with pressure) earlier today but resolved- made appointment with Dr. Antoine Poche just to be on the safe side A/P: Nasal congestion/sinus pressure/cough for 3 weeks without shortness of breath or chest congestion-concern for bacterial sinusitis and recommended 7 days of Augmentin with follow-up if fails to improve -Warned of risk of diarrhea with Augmentin but he states he actually generally tolerates this  # Diarrhea S: Patient reports over the last 3 days has had 3 rounds of diarrhea 3 days ago then just 1 a day the last 2 days as he pulled back on medications as below.  Also has had nausea and decreased appetite. - No recent  medications tried - No fever. Some chills- but stays cold at baseline - No vomiting  -recent trials of dicyclomine and IBSrela- diarrhea with both then stopped diclyclomine and retiraled and got diarrhea again -still on miralax capful daily -Is able to stay well-hydrated   In past:  - Metamucil muscle cramps -Amitiza- stomach cramps  A/P: Diarrhea may have been medication related with dicyclomine and IBSRela-improving off of these.  I also recommend cutting MiraLAX either out or down to half capful until things further improved but he is concerned about getting too constipated so minimal and likely will do half capful   #CAD/hyperlipidemia-history of CABG x5- follows with Dr. Antoine Poche S: Meds: asa 81mg , praluent -Fatigue on Repatha in 2021-restarted on Zetia 10 mg p.o. as well but later placed back on praluent 150 mg q2 weeks -Stent drug eluting placed 04/13/19 Dr. Clifton James -No shortness of breath, some chest pain this morning after coughing and moving some boxes but no exertional pain reported and hurts when presses on area A/P: CAD appears asymptomatic-chest wall pain appears to be muscular-he is still scheduled follow-up with cardiology  which I think is reasonable-should still seek care if new or worsening symptoms.  Continue Praluent and Zetia along with aspirin 81 mg-he asks about stopping Zetia but LDL is at goal under 55 and we opted to continue this after discussion   #Hypertension/CKD stage III S: Compliant with amlodipine 5 mg, hydrochlorothiazide 25 mg and valsartan 320mg  -GFR has been stable.  On ARB in case proteinuric element A/P: Blood pressure well-controlled and he would like to reduce medicines if possible-since he already frequently urinates he would like to reduce the hydrochlorothiazide to half tablet-I recommended trialing that-will be checked at cardiology within a month  Recommended follow up: Return for as needed for new, worsening, persistent symptoms. Future  Appointments  Date Time Provider Department Center  07/23/2023  3:00 PM Rollene Rotunda, MD [facility] None  08/05/2023 11:30 AM Henrene Pastor, RPH-CPP [facility] None  08/18/2023  9:30 AM Hilty, Lisette Abu, MD [facility]  08/23/2023 10:20 AM Legrand Como, PA-C [facility]  08/25/2023  9:20 AM Shelva Majestic, MD [facility]  08/26/2023  8:45 AM Judi Saa, DO [facility] None  10/18/2023  9:00 AM Glenford Bayley, NP [facility] None  04/27/2024 10:00 AM Van Clines, MD [facility] None  06/15/2024 10:00 AM [facility] ANNUAL WELLNESS VISIT 1 [facility]    Lab/Order associations:   ICD-10-CM   1. Bacterial sinusitis  J32.9    B96.89     2. Atherosclerosis of native coronary artery of native heart without angina pectoris  I25.10     3. Essential hypertension  I10     4. Hyperlipidemia, unspecified hyperlipidemia type  E78.5       Meds ordered this encounter  Medications   amoxicillin-clavulanate (AUGMENTIN) 875-125 MG tablet    Sig: Take 1 tablet by mouth 2 (two) times daily for 7 days.    Dispense:  14 tablet    Refill:  0    Return precautions advised.  Tana Conch, MD

## 2023-07-01 NOTE — Patient Instructions (Addendum)
 Try to reduce the hydrochlorothiazide to half tablet- let me know if blood pressure gets above 135/85 on average and go back up in that case.   Miralax down to once a day- hold or try just half capful but we don't want to back you up either with constipation  Hold dicyclomine and IBSrela - let me know if diarrhea doesn't resolve  3 weeks of sinus pressure and congestion- I think this is running down into your throat as well and you are coughing this back up- with duration of symptoms likely bacterial sinusitis- treat with 7 days of Augmentin and see [place] back if you fail to improve  Joshua [name] Client the boy with the broken brain  Recommended follow up: Return for as needed for new, worsening, persistent symptoms. Otherwise keep June visit

## 2023-07-10 ENCOUNTER — Emergency Department (HOSPITAL_BASED_OUTPATIENT_CLINIC_OR_DEPARTMENT_OTHER)
Admission: EM | Admit: 2023-07-10 | Discharge: 2023-07-10 | Disposition: A | Discharge status: Home / Self Care | Attending: Emergency Medicine | Admitting: Emergency Medicine

## 2023-07-10 ENCOUNTER — Emergency Department (HOSPITAL_BASED_OUTPATIENT_CLINIC_OR_DEPARTMENT_OTHER)

## 2023-07-10 ENCOUNTER — Encounter (HOSPITAL_BASED_OUTPATIENT_CLINIC_OR_DEPARTMENT_OTHER): Payer: Self-pay | Admitting: Emergency Medicine

## 2023-07-10 ENCOUNTER — Other Ambulatory Visit: Payer: Self-pay

## 2023-07-10 DIAGNOSIS — R531 Weakness: Secondary | ICD-10-CM | POA: Diagnosis not present

## 2023-07-10 DIAGNOSIS — I6782 Cerebral ischemia: Secondary | ICD-10-CM | POA: Diagnosis not present

## 2023-07-10 DIAGNOSIS — Z7982 Long term (current) use of aspirin: Secondary | ICD-10-CM | POA: Insufficient documentation

## 2023-07-10 DIAGNOSIS — M25561 Pain in right knee: Secondary | ICD-10-CM | POA: Insufficient documentation

## 2023-07-10 DIAGNOSIS — M79601 Pain in right arm: Secondary | ICD-10-CM | POA: Insufficient documentation

## 2023-07-10 DIAGNOSIS — I700 Atherosclerosis of aorta: Secondary | ICD-10-CM | POA: Diagnosis not present

## 2023-07-10 DIAGNOSIS — R519 Headache, unspecified: Secondary | ICD-10-CM | POA: Diagnosis not present

## 2023-07-10 DIAGNOSIS — R5383 Other fatigue: Secondary | ICD-10-CM | POA: Diagnosis present

## 2023-07-10 DIAGNOSIS — Z79899 Other long term (current) drug therapy: Secondary | ICD-10-CM | POA: Insufficient documentation

## 2023-07-10 DIAGNOSIS — M25551 Pain in right hip: Secondary | ICD-10-CM | POA: Insufficient documentation

## 2023-07-10 LAB — CBC WITH DIFFERENTIAL/PLATELET
Abs Immature Granulocytes: 0.04 K/uL (ref 0.00–0.07)
Basophils Absolute: 0.1 K/uL (ref 0.0–0.1)
Basophils Relative: 1 %
Eosinophils Absolute: 0.4 K/uL (ref 0.0–0.5)
Eosinophils Relative: 5 %
HCT: 41.8 % (ref 39.0–52.0)
Hemoglobin: 14.9 g/dL (ref 13.0–17.0)
Immature Granulocytes: 1 %
Lymphocytes Relative: 18 %
Lymphs Abs: 1.4 K/uL (ref 0.7–4.0)
MCH: 31.2 pg (ref 26.0–34.0)
MCHC: 35.6 g/dL (ref 30.0–36.0)
MCV: 87.6 fL (ref 80.0–100.0)
Monocytes Absolute: 0.8 K/uL (ref 0.1–1.0)
Monocytes Relative: 10 %
Neutro Abs: 5.2 K/uL (ref 1.7–7.7)
Neutrophils Relative %: 65 %
Platelets: 207 K/uL (ref 150–400)
RBC: 4.77 MIL/uL (ref 4.22–5.81)
RDW: 12 % (ref 11.5–15.5)
WBC: 7.9 K/uL (ref 4.0–10.5)
nRBC: 0 % (ref 0.0–0.2)

## 2023-07-10 LAB — COMPREHENSIVE METABOLIC PANEL WITH GFR
ALT: 21 U/L (ref 0–44)
AST: 21 U/L (ref 15–41)
Albumin: 3.6 g/dL (ref 3.5–5.0)
Alkaline Phosphatase: 58 U/L (ref 38–126)
Anion gap: 9 (ref 5–15)
BUN: 17 mg/dL (ref 8–23)
CO2: 26 mmol/L (ref 22–32)
Calcium: 9.6 mg/dL (ref 8.9–10.3)
Chloride: 104 mmol/L (ref 98–111)
Creatinine, Ser: 1.24 mg/dL (ref 0.61–1.24)
GFR, Estimated: 59 mL/min — ABNORMAL LOW (ref >60)
Glucose, Bld: 89 mg/dL (ref 70–99)
Potassium: 3.9 mmol/L (ref 3.5–5.1)
Sodium: 139 mmol/L (ref 135–145)
Total Bilirubin: 0.7 mg/dL (ref 0.0–1.2)
Total Protein: 6.6 g/dL (ref 6.5–8.1)

## 2023-07-10 LAB — MAGNESIUM: Magnesium: 1.8 mg/dL (ref 1.7–2.4)

## 2023-07-10 LAB — TROPONIN I (HIGH SENSITIVITY): Troponin I (High Sensitivity): 6 ng/L

## 2023-07-10 MED ORDER — SODIUM CHLORIDE 0.9 % IV BOLUS
1000.0000 mL | Freq: Once | INTRAVENOUS | Status: AC
  Administered 2023-07-10: 1000 mL via INTRAVENOUS

## 2023-07-10 NOTE — ED Triage Notes (Signed)
 Pt with fatigue since last night; sts feels exhausted; sts he was driving home last night and had trouble seeing; c/o [name] pain around bicep into shoulder; finished amoxicillin  this week for sinus infection

## 2023-07-10 NOTE — Discharge Instructions (Signed)
 As we discussed the radiologist has not had a chance to read your chest x-ray.  Please discuss your visit with your family doctor so that they can look at your x-ray and make sure there is no findings that need to be addressed.  Please return for worsening symptoms one-sided numbness or weakness or difficulty speech or swallowing.

## 2023-07-10 NOTE — ED Provider Notes (Signed)
 [place] [facility] Provider Note   CSN: [id number] Arrival date & time: 07/10/23  1220     History  Chief Complaint  Patient presents with   Fatigue    Joshua [name] is a 83 y.o. male.  83 yo M with a chief complaints of feeling generally fatigued.  He tells me he has been having some right arm pain this been going on for about a week.  Has also been having right knee and right hip pain has been ongoing.  He said he went out with some friends last night and then he woke up this morning and felt like he was really fatigued.  Tells me he feels like maybe his brain is acting slower than normal.        Home Medications Prior to Admission medications   Medication Sig Start Date End Date Taking? Authorizing Provider  acetaminophen  (TYLENOL ) 500 MG tablet Take 1,000 mg by mouth every 6 (six) hours as needed for moderate pain or headache.    [provider]  albuterol  (PROAIR  HFA) 108 (90 Base) MCG/ACT inhaler Inhale 1-2 puffs into the lungs every 6 (six) hours as needed for wheezing or shortness of breath. 12/06/21   Versa Gore, NP  Alirocumab  (PRALUENT ) 150 MG/ML SOAJ INJECT 1 DOSE INTO THE SKIN EVERY 14 (FOURTEEN) DAYS. 12/08/22   Hilty, Aviva Lemmings, MD  amLODipine  (NORVASC ) 5 MG tablet TAKE 1 TABLET (5 MG TOTAL) BY MOUTH DAILY. 02/03/23   Hazle Lites, MD  aspirin  EC 81 MG tablet Take 1 tablet (81 mg total) by mouth 2 (two) times daily after a meal. 06/18/20   Lucrezia Sachs, PA-C  cholecalciferol (VITAMIN D3) 25 MCG (1000 UNIT) tablet Take 1,000 Units by mouth daily.    [provider]  Cyanocobalamin  (B-12) 2500 MCG TABS Take 1,250 mcg by mouth daily.    [provider]  diazepam  (VALIUM ) 5 MG tablet TAKE 1 TABLET (5 MG TOTAL) BY MOUTH AT BEDTIME AS NEEDED (SLEEP). 04/16/23   Almira Jaeger, MD  dicyclomine  (BENTYL ) 10 MG capsule Take 1 capsule (10 mg total) by mouth in the morning, at noon, and at bedtime.  06/25/23   McMichael, Elba Greathouse, PA-C  EPINEPHrine  0.3 mg/0.3 mL IJ SOAJ injection Inject 0.3 mg into the muscle as needed for anaphylaxis. for allergic reaction 04/24/19   [provider]  ezetimibe  (ZETIA ) 10 MG tablet TAKE 1 TABLET BY MOUTH EVERY DAY 02/03/23   Eilleen Grates, MD  fluticasone  (FLONASE ) 50 MCG/ACT nasal spray Place 1 spray into both nostrils daily. 09/05/12   Janece Means, MD  folic acid  (FOLVITE ) 800 MCG tablet Take 800 mcg by mouth daily.     [provider]  gabapentin  (NEURONTIN ) 300 MG capsule Take 2 cap in AM, 1 cap at noon, 3 caps at bedtime 05/26/23   Jhonny Moss, MD  Glucos-Chond-Hyal Ac-Ca Fructo (MOVE FREE JOINT HEALTH ADVANCE PO) Take 1 tablet by mouth daily.    [provider]  hydrochlorothiazide  (HYDRODIURIL ) 25 MG tablet TAKE 1 TABLET BY MOUTH EVERY DAY 02/02/23   Almira Jaeger, MD  ketoconazole (NIZORAL) 2 % cream Apply 1 Application topically 2 (two) times daily as needed. 01/25/23   [provider]  Lidocaine  HCl (ASPERCREME LIDOCAINE ) 4 % CREA Apply topically in the morning and at bedtime.    [provider]  mirabegron  ER (MYRBETRIQ ) 50 MG TB24 tablet Take 1 tablet (50 mg total) by mouth daily.  10/01/22   Almira Jaeger, MD  nitroGLYCERIN  (NITROSTAT ) 0.4 MG SL tablet Place 1 tablet (0.4 mg total) under the tongue every 5 (five) minutes as needed for chest pain. 12/11/22   Almira Jaeger, MD  pantoprazole  (PROTONIX ) 40 MG tablet TAKE 1 TABLET BY MOUTH EVERY DAY 04/12/23   Hilty, Aviva Lemmings, MD  polyethylene glycol powder (GLYCOLAX /MIRALAX ) powder Take 17 g by mouth daily. Patient taking differently: Take 17 g by mouth once. 08/29/14   Eino Gravel, MD  Probiotic Product (PROBIOTIC PO) Take by mouth in the morning.    [provider]  valsartan  (DIOVAN ) 320 MG tablet TAKE 1 TABLET BY MOUTH EVERY DAY 06/25/23   Almira Jaeger, MD      Allergies    Codeine, Lexapro  [escitalopram ], Bromfed, Clarithromycin,  Cymbalta  [duloxetine  hcl], Doxycycline hyclate, Modafinil, Oxycodone -acetaminophen , Promethazine hcl, Quinolones, Telithromycin, Tramadol , Benadryl [diphenhydramine], Hydrocodone -acetaminophen , Repatha  [evolocumab ], Statins, and Sulfonamide derivatives    Review of Systems   Review of Systems  Physical Exam Updated Vital Signs BP (!) 150/61 (BP Location: Right Arm)   Pulse 71   Temp 97.8 F (36.6 C) (Oral)   Resp 16   Ht 5\' 6"  (1.676 m)   Wt 90.1 kg   SpO2 95%   BMI 32.06 kg/m  Physical Exam Vitals and nursing note reviewed.  Constitutional:      Appearance: He is well-developed.  HENT:     Head: Normocephalic and atraumatic.  Eyes:     Pupils: Pupils are equal, round, and reactive to light.  Neck:     Vascular: No JVD.  Cardiovascular:     Rate and Rhythm: Normal rate and regular rhythm.     Heart sounds: No murmur heard.    No friction rub. No gallop.  Pulmonary:     Effort: No respiratory distress.     Breath sounds: No wheezing.  Abdominal:     General: There is no distension.     Tenderness: There is no abdominal tenderness. There is no guarding or rebound.  Musculoskeletal:        General: Normal range of motion.     Cervical back: Normal range of motion and neck supple.  Skin:    Coloration: Skin is not pale.     Findings: No rash.  Neurological:     Mental Status: He is alert and oriented to person, place, and time.  Psychiatric:        Behavior: Behavior normal.     ED Results / Procedures / Treatments   Labs (all labs ordered are listed, but only abnormal results are displayed) Labs Reviewed  COMPREHENSIVE METABOLIC PANEL WITH GFR - Abnormal; Notable for the following components:      Result Value   GFR, Estimated 59 (*)    All other components within normal limits  CBC WITH DIFFERENTIAL/PLATELET  MAGNESIUM  TROPONIN I (HIGH SENSITIVITY)    EKG EKG Interpretation Date/Time:  Saturday July 10 2023 12:36:47 EDT Ventricular Rate:  73 PR  Interval:  176 QRS Duration:  80 QT Interval:  376 QTC Calculation: 414 R Axis:   -26  Text Interpretation: Sinus rhythm with occasional Premature ventricular complexes Possible Anterior infarct , age undetermined Abnormal ECG No significant change since last tracing Confirmed by Albertus Hughs [phone]) on 07/10/2023 1:42:23 PM  Radiology CT Head Wo Contrast Result Date: 07/10/2023 CLINICAL DATA:  New onset headache and weakness. EXAM: CT HEAD WITHOUT CONTRAST TECHNIQUE: Contiguous axial images were obtained from the base of the  skull through the vertex without intravenous contrast. RADIATION DOSE REDUCTION: This exam was performed according to the departmental dose-optimization program which includes automated exposure control, adjustment of the mA and/or kV according to patient size and/or use of iterative reconstruction technique. COMPARISON:  03/28/2019 FINDINGS: Brain: Age related volume loss. Chronic small-vessel ischemic changes of the white matter. No sign of acute infarction, mass lesion, hemorrhage, hydrocephalus or extra-axial collection. Vascular: There is atherosclerotic calcification of the major vessels at the base of the brain. Skull: Negative Sinuses/Orbits: Clear/normal Other: None IMPRESSION: No acute CT finding. Age related volume loss. Chronic small-vessel ischemic changes of the white matter. Electronically Signed   By: Bettylou Brunner M.D.   On: 07/10/2023 13:53    Procedures Procedures    Medications Ordered in ED Medications  sodium chloride  0.9 % bolus 1,000 mL (0 mLs Intravenous Stopped 07/10/23 1454)    ED Course/ Medical Decision Making/ A&P                                 Medical Decision Making Amount and/or Complexity of Data Reviewed Labs: ordered. Radiology: ordered.   44 y oM with a chief complaints of generalized fatigue.  He said yesterday he woke up and felt old.  He denies any chest pain denies abdominal pain denies nausea vomiting or diarrhea.  He has  recently been treated for possible sinusitis.  Will obtain a chest x-ray blood work.  He was complaining of mild headache as I left the room we will obtain a CT of the head.  Reassess.  CT of the head without obvious acute intracranial pathology.  No anemia, no significant electrolyte abnormalities.  Chest x-ray independently interpreted by me without focal infiltrate or pneumothorax.  Patient is feeling better after IV fluids.  Would like to go home.  Unfortunately we have had a significant delay for formal read of the patient's chest x-ray.  I discussed this with the patient he would prefer to go home at this time.  Will follow-up with his PCP in the office.  3:03 PM:  I have discussed the diagnosis/risks/treatment options with the patient.  Evaluation and diagnostic testing in the emergency department does not suggest an emergent condition requiring admission or immediate intervention beyond what has been performed at this time.  They will follow up with PCP. We also discussed returning to the ED immediately if new or worsening sx occur. We discussed the sx which are most concerning (e.g., sudden worsening pain, fever, inability to tolerate by mouth) that necessitate immediate return. Medications administered to the patient during their visit and any new prescriptions provided to the patient are listed below.  Medications given during this visit Medications  sodium chloride  0.9 % bolus 1,000 mL (0 mLs Intravenous Stopped 07/10/23 1454)     The patient appears reasonably screen and/or stabilized for discharge and I doubt any other medical condition or other [facility] requiring further screening, evaluation, or treatment in the ED at this time prior to discharge.          Final Clinical Impression(s) / ED Diagnoses Final diagnoses:  Fatigue, unspecified type    Rx / DC Orders ED Discharge Orders     None         Albertus Hughs, DO 07/10/23 1503

## 2023-07-10 NOTE — ED Notes (Signed)
 ED Provider at bedside.

## 2023-07-12 ENCOUNTER — Telehealth: Payer: Self-pay | Admitting: Family Medicine

## 2023-07-12 DIAGNOSIS — M6281 Muscle weakness (generalized): Secondary | ICD-10-CM | POA: Diagnosis not present

## 2023-07-12 DIAGNOSIS — M62838 Other muscle spasm: Secondary | ICD-10-CM | POA: Diagnosis not present

## 2023-07-12 DIAGNOSIS — R102 Pelvic and perineal pain: Secondary | ICD-10-CM | POA: Diagnosis not present

## 2023-07-12 DIAGNOSIS — N3941 Urge incontinence: Secondary | ICD-10-CM | POA: Diagnosis not present

## 2023-07-12 DIAGNOSIS — K5900 Constipation, unspecified: Secondary | ICD-10-CM | POA: Diagnosis not present

## 2023-07-12 NOTE — Telephone Encounter (Signed)
 Pt was seen in [facility] on 07/10/23  Patient Name First: Joshua Last: [name] Gender: Male DOB: 08-17-1940 Age: 83 Y 4 M 27 D Return Phone Number: [phone] (Primary), [phone] (Secondary) Address: City/ State/ Zip: [place]  [address] Client [facility] Night - Human resources officer [facility] Night Provider Clarisa Crooked- MD Contact Type Call Who Is Calling Patient / Member / Family / Caregiver Call Type Triage / Clinical Relationship To Patient Self Return Phone Number [phone] (Primary) Chief Complaint BREATHING - shortness of breath or sounds breathless Reason for Call Symptomatic / Request for Health Information Initial Comment Caller states he has mucus in his throat and is experiencing labored breathing he is tired, has right arm pain. Translation No Nurse Assessment Nurse: Hoyt Macleod, RN, Callie Date/Time (Eastern Time): 07/10/2023 11:42:52 AM Confirm and document reason for call. If symptomatic, describe symptoms. ---Caller states he has pain in his right arm and he is having some labored breathing. He states there is weight on his chest. He states he woke up very tired and weak. Does the patient have any new or worsening symptoms? ---Yes Will a triage be completed? ---Yes Related visit to physician within the last 2 weeks? ---No Does the PT have any chronic conditions? (i.e. diabetes, asthma, this includes High risk factors for pregnancy, etc.) ---Yes List chronic conditions. ---HTN, high cholesterol, hx of 5 bypasses and stent Is this a behavioral health or substance abuse call? ---No Guidelines Guideline Title Affirmed Question Affirmed Notes Nurse Date/Time [facility] Time) Chest Pain [1] Chest pain lasts > 5 minutes AND [2] age > 65 Hoyt Macleod, RN, Callie 07/10/2023 11:44:22 AM Disp. Time [facility] Time) Disposition Final User 07/10/2023 11:40:36 AM Send to Urgent Donella Fulling 07/10/2023 11:47:23 AM Call EMS 911 Now Yes [place], RN, [facility] 07/10/2023 11:48:33 AM 911 Outcome Documentation Cureton, RN, Michaell Adolph Reason: Called declined calling EMS and asked if he could go to UC. Advised that he is seen in ED for the severity of symptoms, he states his wife will take him to the ED. Final Disposition 07/10/2023 11:47:23 AM Call EMS 911 Now Yes Cureton, RN, Michaell Adolph Caller Disagree/Comply Comply Caller Understands Yes PreDisposition Go to Urgent Care/Walk-In Clinic Care Advice Given Per Guideline CALL EMS 911 NOW: * Immediate medical attention is needed. You need to hang up and call 911 (or an ambulance). * Triager Discretion: I'll call you back in a few minutes to be sure you were able to reach them. CARE ADVICE given per Chest Pain (Adult) guideline.

## 2023-07-19 ENCOUNTER — Encounter: Payer: Self-pay | Admitting: Family Medicine

## 2023-07-19 NOTE — Telephone Encounter (Signed)
 Please see patient msg as FYI and advise appt or recommendations

## 2023-07-20 ENCOUNTER — Encounter: Payer: Self-pay | Admitting: Family Medicine

## 2023-07-20 ENCOUNTER — Ambulatory Visit (INDEPENDENT_AMBULATORY_CARE_PROVIDER_SITE_OTHER): Admitting: Family Medicine

## 2023-07-20 VITALS — BP 120/80 | HR 61 | Temp 97.0°F | Ht 66.0 in | Wt 200.2 lb

## 2023-07-20 DIAGNOSIS — B9689 Other specified bacterial agents as the cause of diseases classified elsewhere: Secondary | ICD-10-CM

## 2023-07-20 DIAGNOSIS — J4541 Moderate persistent asthma with (acute) exacerbation: Secondary | ICD-10-CM | POA: Diagnosis not present

## 2023-07-20 DIAGNOSIS — R0789 Other chest pain: Secondary | ICD-10-CM

## 2023-07-20 DIAGNOSIS — J329 Chronic sinusitis, unspecified: Secondary | ICD-10-CM

## 2023-07-20 MED ORDER — PREDNISONE 20 MG PO TABS: ORAL_TABLET | ORAL | 0 refills | Status: DC

## 2023-07-20 MED ORDER — AMOXICILLIN-POT CLAVULANATE 875-125 MG PO TABS: 1.0000 | ORAL_TABLET | Freq: Two times a day (BID) | ORAL | 0 refills | Status: DC

## 2023-07-20 NOTE — Progress Notes (Signed)
 Phone [phone] In person visit   Subjective:   Joshua [name] is a 83 y.o. year old very pleasant male patient who presents for/with See problem oriented charting Chief Complaint  Patient presents with   Cough    Pt c/o productive x2 weeks with chest congestion that is getting worse.   Past Medical History-  Patient Active Problem List   Diagnosis Date Noted   Scrotal pain 02/11/2023    Priority: High   Coronary atherosclerosis 10/12/2006    Priority: High   Overactive bladder 11/24/2019    Priority: Medium    Hyperglycemia 10/07/2017    Priority: Medium    Gout 11/20/2016    Priority: Medium    CKD (chronic kidney disease), stage III (HCC) 10/23/2013    Priority: Medium    Neuropathy (HCC) 08/21/2013    Priority: Medium    History of bladder cancer 04/01/2010    Priority: Medium    Essential hypertension 02/21/2007    Priority: Medium    Hyperlipidemia 10/12/2006    Priority: Medium    Asthma 10/12/2006    Priority: Medium    Aortic atherosclerosis (HCC) 05/05/2018    Priority: Low   Senile purpura (HCC) 10/07/2017    Priority: Low   DJD (degenerative joint disease)     Priority: Low   Arthritis     Priority: Low   Neck pain 02/08/2017    Priority: Low   Venous insufficiency 12/29/2016    Priority: Low   Ganglion cyst of tendon sheath of right hand 12/29/2016    Priority: Low   Obesity, Class I, BMI 30-34.9 10/03/2015    Priority: Low   Allergic rhinitis 05/31/2014    Priority: Low   Fatigue 11/09/2013    Priority: Low   Dizziness 08/23/2013    Priority: Low   Chronic bilateral low back pain with left-sided sciatica 10/09/2011    Priority: Low   Localized osteoarthrosis, lower leg 10/04/2009    Priority: Low   Tinnitus 05/05/2007    Priority: Low   OSA (obstructive sleep apnea) 11/02/2006    Priority: Low   GERD 10/12/2006    Priority: Low   De Quervain's tenosynovitis, right 10/28/2022    Priority: 1.   Degenerative arthritis of right  knee 06/08/2022    Priority: 1.   Left hamstring injury 04/21/2022    Priority: 1.   Degenerative cervical disc 01/12/2022    Priority: 1.   Greater trochanteric bursitis of right hip 02/17/2021    Priority: 1.   Primary osteoarthritis of left knee 06/18/2020    Priority: 1.   Trigger thumb, left thumb 01/24/2020    Priority: 1.   Greater trochanteric bursitis of left hip 10/27/2018    Priority: 1.   Degenerative disc disease, lumbar 10/27/2018    Priority: 1.   Abdominal muscle strain, initial encounter 06/16/2018    Priority: 1.   Weakness of left leg 06/21/2017    Priority: 1.   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     albuterol  (PROAIR  HFA) 108 (90 Base) MCG/ACT inhaler Inhale 1-2 puffs into the lungs every 6 (six) hours as needed for wheezing or shortness of breath. 18 g 5   Alirocumab  (PRALUENT ) 150 MG/ML SOAJ INJECT 1 DOSE INTO THE SKIN EVERY 14 (FOURTEEN) DAYS. 6 mL 3   amLODipine  (NORVASC ) 5 MG tablet TAKE 1 TABLET (5 MG TOTAL) BY MOUTH DAILY. 90 tablet 3   aspirin  EC 81 MG tablet Take 1 tablet (81 mg total) by mouth 2 (two) times daily after a meal. 30 tablet 11   cholecalciferol (VITAMIN D3) 25 MCG (1000 UNIT) tablet Take 1,000 Units by mouth daily.     Cyanocobalamin  (B-12) 2500 MCG TABS Take 1,250 mcg by mouth daily.     diazepam  (VALIUM ) 5 MG tablet TAKE 1 TABLET (5 MG TOTAL) BY MOUTH AT BEDTIME AS NEEDED (SLEEP). 30 tablet 0    dicyclomine  (BENTYL ) 10 MG capsule Take 1 capsule (10 mg total) by mouth in the morning, at noon, and at bedtime. 90 capsule 2   EPINEPHrine  0.3 mg/0.3 mL IJ SOAJ injection Inject 0.3 mg into the muscle as needed for anaphylaxis. for allergic reaction     ezetimibe  (ZETIA ) 10 MG tablet TAKE 1 TABLET BY MOUTH EVERY DAY 90 tablet 2   fluticasone  (FLONASE ) 50 MCG/ACT nasal spray Place 1 spray into both nostrils daily.     folic acid  (FOLVITE ) 800 MCG tablet Take 800 mcg by mouth daily.      gabapentin  (NEURONTIN ) 300 MG capsule Take 2 cap in AM, 1 cap at noon, 3 caps at bedtime 540 capsule 3   Glucos-Chond-Hyal Ac-Ca Fructo (MOVE FREE JOINT HEALTH ADVANCE PO) Take 1 tablet by mouth daily.     hydrochlorothiazide  (HYDRODIURIL ) 25 MG tablet TAKE 1 TABLET BY MOUTH EVERY DAY 90 tablet 2   ketoconazole (NIZORAL) 2 % cream Apply 1 Application topically 2 (two) times daily as needed.     Lidocaine  HCl (ASPERCREME LIDOCAINE ) 4 % CREA Apply topically in the morning and at bedtime.     mirabegron  ER (MYRBETRIQ ) 50 MG TB24 tablet Take 1 tablet (50 mg total) by mouth daily. 90 tablet 1   nitroGLYCERIN  (NITROSTAT ) 0.4 MG SL tablet Place 1 tablet (0.4 mg total) under the tongue every 5 (five) minutes as needed for chest pain. 30 tablet 5   pantoprazole  (PROTONIX ) 40 MG tablet TAKE 1 TABLET BY MOUTH EVERY DAY 90 tablet 3   polyethylene glycol powder (GLYCOLAX /MIRALAX ) powder Take 17 g by mouth daily. (Patient taking differently: Take 17 g by mouth once.) 255 g 0   Probiotic Product (PROBIOTIC PO) Take by mouth in the morning.     valsartan  (DIOVAN ) 320 MG tablet TAKE 1 TABLET BY MOUTH EVERY DAY 90 tablet 3   No current facility-administered medications for this visit.     Objective:  BP 120/80   Pulse 61   Temp (!) 97 F (36.1 C)   Ht 5\' 6"  (1.676 m)   Wt 200 lb 3.2 oz (90.8 kg)   SpO2 97%   BMI 32.31 kg/m  Gen: NAD, resting comfortably Bilateral maxillary and frontal sinus tenderness noted.  Nasal  turbinates edematous and erythematous without active discharge.  Pharynx mild drainage.  Tympanic membranes normal bilaterally CV: RRR no murmurs rubs or gallops -Chest wall tenderness along sternum bilaterally-worse on the left Lungs: CTAB other than some intermittent wheeze Abdomen: soft/nontender/nondistended/normal bowel sounds. No rebound or guarding.  Ext: no edema Skin: warm,  dry     Assessment and Plan    # Cough/chest fullness- possible congestion related/sinus presssure S:patient with bacterial sinus infection  diagnosed 07/01/23 after 3 weeks of mucus in throat and nasal congestion- had some cough- wife had recently been sick with similar -treated with 7 days at that time of Augmentin  -symptoms improved with this about 75% but never fully went away  He later went to Emergency Department on 07/10/23 with acute fatigue and mental slowing after waking up feeling odd/off as well as some right arm pain. No sinus complaints noted on Emergency Department visit(but reports to me was feeling at much lower level than before). CT head without acute intracranial pathology. CXR without pneumonia or bronchitis. Troponin not elevated. Magesium normal. CMP normal other than mild decrease GFR at 59. CBC normal.   Today, He reports since Ed visit has had worsening cough in frequency - yesterday was particularly bad. Productive yesterday- darker than usual- milky almost. Some shortness of breath today for the first time- felt worse this morning and more fatigued. Last night did ago from 4 30 through most of night but woke up feeling worse despite that. Feels heaviness and fullness in chest with the cough and pain at times- chest wall is sensitive. Mild sore throat -still a lot of stress at home with redoing kitchen -also has visit with cardiology on Friday- he scheduled after Emergency Department visit - no specific medicine for illness yet tried -some whistling from nose with laying down- no wheezing  from chest. On flonase  -tried albuterol  yesterday with little relief A/P: patient with cough/congestion/sinus pressure dating back to march with 75% improvement on 7 day course of Augmentin  in early April that has subsequently worsened after completing course. This could be inadequately treated sinusitis (very tender on exam) that has then caused lung irritation. No obvious pneumonia on exam and wants to hold off on x-ray today.  - clarithromycin allergy so avoid azithromycin , doxycycline allergy so that is not an option - we do not have atypical bacterial coverage but with prior improvement we opted to trial 10 days of Augmentin  -if he is not making good strides within 24 hours will add prednisone  (asthma could contribute to chest pressure heaviness) but doesn't sleep well on this so wanted of the give Augmentin  a chance first plus we opted for lower dose -less cough today but feels more drained-I agree with him keeping cardiology visit this week. Definitely has chest wall pain and could be from cough. We discussed to do full heart workup would need Emergency Department visit with troponins- he declines that unless symptoms worsen or fail to improve  - has had some chest pressure with this which could be asthma but has CAD history as ewll and if fails to improve or worsens agrees to go to Emergency Department or if shortness of breath worsens  Recommended follow up: Return for as needed for new, worsening, persistent symptoms. Future Appointments  Date Time Provider Department Center  07/23/2023  3:00 PM Eilleen Grates, MD [facility]  08/05/2023 11:30 AM Cecilie Coffee, RPH-CPP [facility] None  08/18/2023  9:30 AM Hilty, Aviva Lemmings, MD [facility]  08/23/2023 10:20 AM Garr Kalata, PA-C [facility]  08/25/2023  9:20 AM Almira Jaeger, MD [facility]  08/26/2023  8:45 AM Isidro Margo, DO [facility] None  10/18/2023  9:00 AM Antonio Baumgarten, NP [facility] None  04/27/2024 10:00  AM Jhonny Moss, MD [facility] None  06/15/2024 10:00 AM [facility]  ANNUAL WELLNESS VISIT 1 [facility]    Lab/Order associations:   ICD-10-CM   1. Bacterial sinusitis  J32.9    B96.89     2. Chest fullness  R07.89     3. Moderate persistent asthma with acute exacerbation  J45.41       Meds ordered this encounter  Medications   amoxicillin -clavulanate (AUGMENTIN ) 875-125 MG tablet    Sig: Take 1 tablet by mouth 2 (two) times daily for 10 days.    Dispense:  20 tablet    Refill:  0   predniSONE  (DELTASONE ) 20 MG tablet    Sig: Take 1 tablet by mouth daily for 5 days, then 1/2 tablet daily for 2 days    Dispense:  6 tablet    Refill:  0    Time Spent: 32 minutes of total time (8:40 AM- 9:12 AM) was spent on the date of the encounter performing the following actions: chart review prior to seeing the patient, obtaining history, performing a medically necessary exam, counseling on the treatment plan and Emergency Department precautions, placing orders, and documenting in our EHR.    Return precautions advised.  Clarisa Crooked, MD

## 2023-07-20 NOTE — Patient Instructions (Addendum)
 patient with cough/congestion/sinus pressure dating back to march with 75% improvement on 7 day course of Augmentin  in early April that has subsequently worsened after completing course. This could be inadequately treated sinusitis that has then caused lung irritation. No obvious pneumonia on exam and wants to hold off on x-ray today.  - clarithromycin allergy so avoid azithromycin , doxycycline allergy so that is not an option - we do not have atypical bacterial coverage but with prior improvement we opted to trial 10 days of Augmentin  -if he is not making good strides within 24 hours will add prednisone  but doesn't sleep well on this so wanted of the give Augmentin  a chance first  - has had some chest pressure with this which could be asthma but has CAD history as ewll and if fails to improve or worsens agrees to go to Emergency Department or if shortness of breath worsens  Recommended follow up: Return for as needed for new, worsening, persistent symptoms.

## 2023-07-22 NOTE — Progress Notes (Signed)
 Cardiology Office Note:   Date:  07/23/2023  ID:  Joshua [name], DOB 09-06-1940, MRN [id number] PCP: Almira Jaeger, MD  [facility] Providers Cardiologist:  Eilleen Grates, MD {  History of Present Illness:   Joshua [name] is a 83 y.o. male  who presents for follow up of CAD s/p CABG, HTN, HLD, pancreatitis and OSA on CPAP. She had a negative perfusion study in 2015.  He had chest pain in January 2020 and routine treadmill stress test showing no ischemia.    Due to recurrent chest discomfort, patient eventually underwent cardiac catheterization on 04/13/2019 that revealed severe triple-vessel coronary artery disease s/p 5 vessel bypass with 5 out of 5 patent bypass grafts.  The native LAD was occluded beyond the first diagonal branch.  Patent LIMA graft. The circumflex is occluded at the ostium.  The moderate caliber distal OM branch fills from the patent vein graft.  The small caliber more proximal OM fills retrograde from collaterals.  The native RCA is occluded proximally.  The vein graft to the distal RCA is patent. A DES was successfully placed to origin to proximal graft lesion saphenous graft to third marginal.  Patient had normal left ventricular systolic function.  He was discharged on aspirin  and Plavix .   Since I last saw him he has been feeling poorly.  He has been fatigued.  Has had a cough.  He is on his second round of antibiotics and steroids.  He says he does not do well with steroids.  He is coughing up some mildly tinged sputum.  He is not describing PND or orthopnea.  He has a little bit of chest discomfort not like his previous angina.  This is associated with some wheezing.  He actually took 1 nitroglycerin  about a week ago just to see if it would help some of the congestion like discomfort and did not.  He was in the emergency room and I reviewed these records for this visit.  EKG was unremarkable and enzymes were normal.  He is not having his usual anginal chest  discomfort that he had before.  He is able to walk up stairs without bringing on any symptoms.  He is limited by knee pain and is not can have any further orthopedic surgeries.  ROS: As stated in the HPI and negative for all other systems.  Studies Reviewed:    EKG:   Sinus rhythm, rate 73, axis leftward, premature ectopic contractions, poor anterior R wave progression, no acute ST-T wave changes 07/10/2023    Risk Assessment/Calculations:              Physical Exam:   VS:  BP 130/76 (BP Location: Right Arm, Patient Position: Sitting, Cuff Size: Normal)   Pulse 82   Ht 5' 6.25" (1.683 m)   Wt 201 lb (91.2 kg)   SpO2 95%   BMI 32.20 kg/m    Wt Readings from Last 3 Encounters:  07/23/23 201 lb (91.2 kg)  07/20/23 200 lb 3.2 oz (90.8 kg)  07/10/23 198 lb 10.2 oz (90.1 kg)     GEN: Well nourished, well developed in no acute distress NECK: No JVD; No carotid bruits CARDIAC: RRR, no murmurs, rubs, gallops RESPIRATORY: Mildly decreased breath sounds bilaterally with few expiratory wheezes ABDOMEN: Soft, non-tender, non-distended EXTREMITIES:  No edema; No deformity   ASSESSMENT AND PLAN:   CAD s/p CABG: The patient is not having symptoms consistent with angina.  He will continue with risk reduction.  Hyperlipidemia:     He has been followed by Dr. Maximo Spar.  LDL was 46 with an HDL of 42 last year.  No change in therapy.   Hypertension:   The BP is at target.  No change in therapy.  Obstructive sleep apnea:    He wears CPAP     Follow up I like him to come back and see an APP in about 3 months or sooner if his cough or shortness of breath is worse.    Signed, Eilleen Grates, MD

## 2023-07-23 ENCOUNTER — Ambulatory Visit: Attending: Cardiology | Admitting: Cardiology

## 2023-07-23 ENCOUNTER — Encounter: Payer: Self-pay | Admitting: Cardiology

## 2023-07-23 ENCOUNTER — Ambulatory Visit: Payer: Self-pay

## 2023-07-23 VITALS — BP 130/76 | HR 82 | Ht 66.25 in | Wt 201.0 lb

## 2023-07-23 DIAGNOSIS — E785 Hyperlipidemia, unspecified: Secondary | ICD-10-CM | POA: Diagnosis not present

## 2023-07-23 DIAGNOSIS — I2510 Atherosclerotic heart disease of native coronary artery without angina pectoris: Secondary | ICD-10-CM

## 2023-07-23 DIAGNOSIS — I10 Essential (primary) hypertension: Secondary | ICD-10-CM | POA: Diagnosis not present

## 2023-07-23 NOTE — Telephone Encounter (Signed)
 Cardiology did not think his symptoms were cardiac-lets get him a follow-up next week if he is still feeling poorly

## 2023-07-23 NOTE — Telephone Encounter (Signed)
 FYI

## 2023-07-23 NOTE — Telephone Encounter (Signed)
 Copied from CRM [id number]. Topic: Clinical - Red Word Triage >> Jul 23, 2023  8:47 AM Alethia Huxley E wrote: [facility] that prompted transfer to Nurse Triage: Chest pressure. Patient has been experiencing some worsening chest pressure over the past couple of days along with some coughing. Patient states he just "feels terrible."    Chief Complaint: Chest Pressure Symptoms: pressure, malaise, blurred vision yesterday,  Frequency: a little over an hour Pertinent Negatives: Patient denies fever, difficulty breathing,  Disposition: [] ED /[] Urgent Care (no appt availability in office) / [] Appointment(In office/virtual)/ []  [facility]/ [] Home Care/ [x] Refused Recommended Disposition /[] [facility]/ []  Follow-up with PCP Additional Notes: Called and advised that he was just at the office on Tuesday (3 days ago) and was given Amoxicillin  and a steroid but he is feeling a lot worse. He states that he is having a lot of pressure  in his chest. Patient used an inhaler and then his chest pressure started but his wheezing got better he states. He states he is not coughing a lot anymore. He states that he feels very weak and every once in a while he gets blurred vision. He also states that he gets a pressure around his head every once in a while. Patient states he has an appointment with his Cardiologist today 07/23/2023 at 3pm. Patient is advised that the Emergency Room is recommended at this time. Patient keeps saying that he doesn't believe that this is a heart related issue. Spoke with the CAL at this time about this patient to let them know he didn't want to go to the Emergency Room and in the Office Visit Notes from 07/20/2023 patient's PCP noted: "- has had some chest pressure with this which could be asthma but has CAD history as ewll and if fails to improve or worsens agrees to go to Emergency Department or if shortness of breath worsens " Patient is read this verbatim but patient  states that he still is not going to to the Emergency Room at this time. He states to tell the Office Staff that he is not going to the Emergency Room. He states that they did an EKG on him when he was at the ER on 07/10/2023. This RN advised him that a lot can change since that date. Patient verbalized that he understood that and for this RN to tell the office that he isn't going to the ER at this time and that if he dies, it is on him".  He states that he has an appointment today with his Cardiologist and he is sure they will do an EKG there as well. Patient is encouraged again that the Emergency Room is recommended for further evaluation and if he gets any worse at any point to please seek immediate medical attention at the Emergency Room or call 911. Patient verbalized understanding and states he isn't going at this time though.       Reason for Disposition . [1] Chest pain lasts > 5 minutes AND [2] age > 30 AND [3] one or more cardiac risk factors (e.g., diabetes, high blood pressure, high cholesterol, smoker, or strong family history of heart disease)  Answer Assessment - Initial Assessment Questions 1. LOCATION: "Where does it hurt?"       Pt states over sternum & hurts on both sides of sternum 2. RADIATION: "Does the pain go anywhere else?" (e.g., into neck, jaw, arms, back)     Non radiating 3. ONSET: "When did the chest  pain begin?" (Minutes, hours or days)      Had chest pressure 3 days ago that went away and is now back since this morning 4. PATTERN: "Does the pain come and go, or has it been constant since it started?"  "Does it get worse with exertion?"      constant 5. DURATION: "How long does it last" (e.g., seconds, minutes, hours)     constant 6. SEVERITY: "How bad is the pain?"  (e.g., Scale 1-10; mild, moderate, or severe)    - MILD (1-3): doesn't interfere with normal activities     - MODERATE (4-7): interferes with normal activities or awakens from sleep    - SEVERE  (8-10): excruciating pain, unable to do any normal activities       5-6 7. CARDIAC RISK FACTORS: "Do you have any history of heart problems or risk factors for heart disease?" (e.g., angina, prior heart attack; diabetes, high blood pressure, high cholesterol, smoker, or strong family history of heart disease)     Cardiology Patient--has an appt with cardiology today at 3pm due to not feeling good-- 8. PULMONARY RISK FACTORS: "Do you have any history of lung disease?"  (e.g., blood clots in lung, asthma, emphysema, birth control pills)     Asthma 9. CAUSE: "What do you think is causing the chest pain?"     ----- 10. OTHER SYMPTOMS: "Do you have any other symptoms?" (e.g., dizziness, nausea, vomiting, sweating, fever, difficulty breathing, cough)       Slight cough,  Protocols used: Chest Pain-A-AH

## 2023-07-23 NOTE — Progress Notes (Signed)
 Addendum: Reviewed and agree with assessment and management plan. Asha Grumbine, Carie Caddy, MD

## 2023-07-23 NOTE — Patient Instructions (Signed)
 Medication Instructions:  Your physician recommends that you continue on your current medications as directed. Please refer to the Current Medication list given to you today.  *If you need a refill on your cardiac medications before your next appointment, please call your pharmacy*  Lab Work: NONE If you have labs (blood work) drawn today and your tests are completely normal, you will receive your results only by: MyChart Message (if you have MyChart) OR A paper copy in the mail If you have any lab test that is abnormal or we need to change your treatment, we will call you to review the results.  Testing/Procedures: NONE  Follow-Up: At [facility], you and your health needs are our priority.  As part of our continuing mission to provide you with exceptional heart care, our providers are all part of one team.  This team includes your primary Cardiologist (physician) and Advanced Practice Providers or APPs (Physician Assistants and Nurse Practitioners) who all work together to provide you with the care you need, when you need it.  Your next appointment:   4 months  Provider:   Eduard Grad, NP  We recommend signing up for the patient portal called "MyChart".  Sign up information is provided on this After Visit Summary.  MyChart is used to connect with patients for Virtual Visits (Telemedicine).  Patients are able to view lab/test results, encounter notes, upcoming appointments, etc.  Non-urgent messages can be sent to your provider as well.   To learn more about what you can do with MyChart, go to [email or web address].   Other Instructions

## 2023-07-26 DIAGNOSIS — M542 Cervicalgia: Secondary | ICD-10-CM | POA: Diagnosis not present

## 2023-07-26 DIAGNOSIS — G4733 Obstructive sleep apnea (adult) (pediatric): Secondary | ICD-10-CM | POA: Diagnosis not present

## 2023-07-26 DIAGNOSIS — R110 Nausea: Secondary | ICD-10-CM | POA: Diagnosis not present

## 2023-07-26 DIAGNOSIS — R55 Syncope and collapse: Secondary | ICD-10-CM | POA: Diagnosis not present

## 2023-07-26 DIAGNOSIS — R001 Bradycardia, unspecified: Secondary | ICD-10-CM | POA: Diagnosis not present

## 2023-07-26 DIAGNOSIS — I959 Hypotension, unspecified: Secondary | ICD-10-CM | POA: Diagnosis not present

## 2023-07-26 NOTE — Telephone Encounter (Signed)
 Pt scheduled for appt on 07/27/23 with PCP

## 2023-07-26 NOTE — Telephone Encounter (Signed)
 Please call pt and schedule an appt per Dr. Arlene Ben for this week.

## 2023-07-27 ENCOUNTER — Encounter: Payer: Self-pay | Admitting: Family Medicine

## 2023-07-27 ENCOUNTER — Ambulatory Visit (INDEPENDENT_AMBULATORY_CARE_PROVIDER_SITE_OTHER): Admitting: Family Medicine

## 2023-07-27 VITALS — BP 124/70 | HR 76 | Temp 97.2°F | Ht 66.2 in | Wt 201.0 lb

## 2023-07-27 DIAGNOSIS — I2510 Atherosclerotic heart disease of native coronary artery without angina pectoris: Secondary | ICD-10-CM

## 2023-07-27 DIAGNOSIS — J329 Chronic sinusitis, unspecified: Secondary | ICD-10-CM | POA: Diagnosis not present

## 2023-07-27 DIAGNOSIS — I10 Essential (primary) hypertension: Secondary | ICD-10-CM | POA: Diagnosis not present

## 2023-07-27 DIAGNOSIS — B9689 Other specified bacterial agents as the cause of diseases classified elsewhere: Secondary | ICD-10-CM | POA: Diagnosis not present

## 2023-07-27 DIAGNOSIS — J301 Allergic rhinitis due to pollen: Secondary | ICD-10-CM | POA: Diagnosis not present

## 2023-07-27 MED ORDER — AMOXICILLIN-POT CLAVULANATE 875-125 MG PO TABS: 1.0000 | ORAL_TABLET | Freq: Two times a day (BID) | ORAL | 0 refills | Status: AC

## 2023-07-27 NOTE — Progress Notes (Signed)
 Phone [phone] In person visit   Subjective:   Joshua [name] is a 83 y.o. year old very pleasant male patient who presents for/with See problem oriented charting Chief Complaint  Patient presents with   Cough    Pt c/o continued cough and chest congestion x3 weeks feels better today.   Past Medical History-  Patient Active Problem List   Diagnosis Date Noted   Scrotal pain 02/11/2023    Priority: High   Coronary atherosclerosis 10/12/2006    Priority: High   Overactive bladder 11/24/2019    Priority: Medium    Hyperglycemia 10/07/2017    Priority: Medium    Gout 11/20/2016    Priority: Medium    CKD (chronic kidney disease), stage III (HCC) 10/23/2013    Priority: Medium    Neuropathy (HCC) 08/21/2013    Priority: Medium    History of bladder cancer 04/01/2010    Priority: Medium    Essential hypertension 02/21/2007    Priority: Medium    Hyperlipidemia 10/12/2006    Priority: Medium    Asthma 10/12/2006    Priority: Medium    Aortic atherosclerosis (HCC) 05/05/2018    Priority: Low   Senile purpura (HCC) 10/07/2017    Priority: Low   DJD (degenerative joint disease)     Priority: Low   Arthritis     Priority: Low   Neck pain 02/08/2017    Priority: Low   Venous insufficiency 12/29/2016    Priority: Low   Ganglion cyst of tendon sheath of right hand 12/29/2016    Priority: Low   Obesity, Class I, BMI 30-34.9 10/03/2015    Priority: Low   Allergic rhinitis 05/31/2014    Priority: Low   Fatigue 11/09/2013    Priority: Low   Dizziness 08/23/2013    Priority: Low   Chronic bilateral low back pain with left-sided sciatica 10/09/2011    Priority: Low   Localized osteoarthrosis, lower leg 10/04/2009    Priority: Low   Tinnitus 05/05/2007    Priority: Low   OSA (obstructive sleep apnea) 11/02/2006    Priority: Low   GERD 10/12/2006    Priority: Low   De Quervain's tenosynovitis, right 10/28/2022    Priority: 1.   Degenerative arthritis of right  knee 06/08/2022    Priority: 1.   Left hamstring injury 04/21/2022    Priority: 1.   Degenerative cervical disc 01/12/2022    Priority: 1.   Greater trochanteric bursitis of right hip 02/17/2021    Priority: 1.   Primary osteoarthritis of left knee 06/18/2020    Priority: 1.   Trigger thumb, left thumb 01/24/2020    Priority: 1.   Greater trochanteric bursitis of left hip 10/27/2018    Priority: 1.   Degenerative disc disease, lumbar 10/27/2018    Priority: 1.   Abdominal muscle strain, initial encounter 06/16/2018    Priority: 1.   Weakness of left leg 06/21/2017    Priority: 1.   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     albuterol  (PROAIR  HFA) 108 (90 Base) MCG/ACT inhaler Inhale 1-2 puffs into the lungs every 6 (six) hours as needed for wheezing or shortness of breath. 18 g 5   Alirocumab  (PRALUENT ) 150 MG/ML SOAJ INJECT 1 DOSE INTO THE SKIN EVERY 14 (FOURTEEN) DAYS. 6 mL 3   amLODipine  (NORVASC ) 5 MG tablet TAKE 1 TABLET (5 MG TOTAL) BY MOUTH DAILY. 90 tablet 3   amoxicillin -clavulanate (AUGMENTIN ) 875-125 MG tablet Take 1 tablet by mouth 2 (two) times daily for 4 days. 4 days more in addition to 10 days already taken 8 tablet 0   aspirin  EC 81 MG tablet Take 1 tablet (81 mg total) by mouth 2 (two) times daily after a meal. 30 tablet 11   cholecalciferol (VITAMIN D3) 25 MCG (1000 UNIT) tablet Take 1,000 Units by mouth daily.      Cyanocobalamin  (B-12) 2500 MCG TABS Take 1,250 mcg by mouth daily.     dicyclomine  (BENTYL ) 10 MG capsule Take 1 capsule (10 mg total) by mouth in the morning, at noon, and at bedtime. 90 capsule 2   EPINEPHrine  0.3 mg/0.3 mL IJ SOAJ injection Inject 0.3 mg into the muscle as needed for anaphylaxis. for allergic reaction     ezetimibe  (ZETIA ) 10 MG tablet TAKE 1 TABLET BY MOUTH EVERY DAY 90 tablet 2   fluticasone  (FLONASE ) 50 MCG/ACT nasal spray Place 1 spray into both nostrils daily.     folic acid  (FOLVITE ) 800 MCG tablet Take 800 mcg by mouth daily.      gabapentin  (NEURONTIN ) 300 MG capsule Take 2 cap in AM, 1 cap at noon, 3 caps at bedtime 540 capsule 3   Glucos-Chond-Hyal Ac-Ca Fructo (MOVE FREE JOINT HEALTH ADVANCE PO) Take 1 tablet by mouth daily.     hydrochlorothiazide  (HYDRODIURIL ) 25 MG tablet TAKE 1 TABLET BY MOUTH EVERY DAY 90 tablet 2   ketoconazole (NIZORAL) 2 % cream Apply 1 Application topically 2 (two) times daily as needed.     Lidocaine  HCl (ASPERCREME LIDOCAINE ) 4 % CREA Apply topically in the morning and at bedtime.     Loratadine  (CLARITIN  PO) Take by mouth at bedtime.     mirabegron  ER (MYRBETRIQ ) 50 MG TB24 tablet Take 1 tablet (50 mg total) by mouth daily. 90 tablet 1   nitroGLYCERIN  (NITROSTAT ) 0.4 MG SL tablet Place 1 tablet (0.4 mg total) under the tongue every 5 (five) minutes as needed for chest pain. 30 tablet 5   pantoprazole  (PROTONIX ) 40 MG tablet TAKE 1 TABLET BY MOUTH EVERY DAY 90 tablet 3   polyethylene glycol powder (GLYCOLAX /MIRALAX ) powder Take 17 g by mouth daily. (Patient taking differently: Take 17 g by mouth once.) 255 g 0   Probiotic Product (PROBIOTIC PO) Take by mouth in the morning.     valsartan  (DIOVAN ) 320 MG tablet TAKE 1 TABLET BY MOUTH EVERY DAY 90 tablet 3   diazepam  (VALIUM ) 5 MG tablet TAKE 1 TABLET (5 MG TOTAL) BY MOUTH AT BEDTIME AS NEEDED (SLEEP). (Patient not taking: Reported on 07/27/2023) 30 tablet 0   No current facility-administered  medications for this visit.     Objective:  BP 124/70   Pulse 76   Temp (!) 97.2 F (36.2 C)   Ht 5' 6.2" (1.681 m)   Wt 201 lb (91.2 kg)   SpO2 95%   BMI 32.25 kg/m  Gen: NAD, resting comfortably No longer having sinus pressure.  Tympanic membrane's normal.  Nasal turbinates without active  drainage.  Pharynx with mild signs of drainage CV: RRR no murmurs rubs or gallops -Some chest wall tenderness along the sternum bilaterally in upper chest Lungs: CTAB no crackles, wheeze, rhonchi Abdomen: Mildly tender-likely related to cough and some abdominal wall strain Ext: no edema Skin: warm, dry     Assessment and Plan   # Cough/congestion/nasal pressure S: Patient was seen on 07/01/2023 with symptoms dating back into March  (3 weeks at that point)of mucus in throat nasal congestion with 75% improvement on Augmentin .  Later went to the emergency department on 07/10/2023 with fatigue and mental slowing in addition to his symptoms-chest x-ray at that time without pneumonia or bronchitis  On 07/20/2023 I saw patient again and he reported worsening cough since leaving the emergency department-productive with darker than usual sputum almost milky.  Mild shortness of breath was noted.  Felt some chest heaviness and fullness as well.  Also heard some whistling from his nose when lying down. - Did admit a lot of stress at home with redoing kitchen - He also had cardiology follow-up the Friday afterwards  With continued nasal congestion/cough/sinus pressure with only 75% improvement on 7-day course of Augmentin  at last visit-we opted out of azithromycin  due to allergy to clarithromycin and doxycycline allergy also noted-my concern was we lacked atypical coverage but opted to trial a 10-day course of Augmentin  - We have considered adding prednisone  if not making significant strides as he also has a history of asthma but he does not sleep on that medicine so we tried to avoid it That she was also  feeling rather drained just in general and he wanted to make sure cardiology did not think this pressure ulcer was cardiac-saw Dr. Lavonne Prairie on 5-25 and he did not think this was cardiac at all -we later had to call in prednisone - some weight increase- not sleeping well though  Started to fell slightly better yesterday- coughing much better.  Denies any further nasal pressure.  Chest pressure still present and still feels very fatigued-cardiology did not think this was cardiac as noted above.  -denies runny nose, watery eyes, sneezing but is getting ongoing cough - nasal wheezing improving- not having wheezing from chest- - pressure in upper chest/throat- trying to get things up- not like his asthma pressure A/P: Cough/congestion in upper chest/nasal pressure-nasal congestion/pressure much improved with combination of prednisone  and Augmentin -2 days left.  Cough improving.  Upper chest congestion and lingering and some pressure particularly along the sternum in upper chest-cardiology not think was cardiac.  Does not feel like his typical asthma - With improvement on Augmentin  we opted to extend course to 14 days - Also has a history of allergies and used to be on allergy shots-unfortunately his provider Dr. Phill Brazil has past and requests a referral within [facility].  He is improvement on prednisone  may signify an allergic element but was also on Augmentin  for bacterial sinusitis at this time and last time improved on both with Augmentin -this time we opted for a full 14-day course -With nearly 2 months of cough considered repeat chest x-ray but did have 1 about 2 weeks ago - Also with 2 months of cough is on pantoprazole  40 mg-do not strongly suspect reflux as the cause -Ongoing fatigue could be related to allergies as well-update labs if not improved by next visit especially if has already seen allergist-for allergies also continue Flonase   #CAD/hyperlipidemia-history of CABG x5- follows with Dr. Lavonne Prairie S:  Meds: asa 81mg , praluent  -Fatigue on Repatha  in  2021-restarted on Zetia  10 mg p.o. as well but later placed back on praluent  150 mg q2 weeks Lab Results  Component Value Date   CHOL 115 06/29/2022   HDL 42 06/29/2022   LDLCALC 46 06/29/2022   LDLDIRECT 115.0 06/04/2016   TRIG 159 (H) 06/29/2022   CHOLHDL 2.7 06/29/2022  A/P: Lipids well-controlled on last check-will likely update next visit and if fatigue not improved update full labs   #Hypertension/CKD stage III S: Compliant with amlodipine  5 mg, hydrochlorothiazide  25 mg (trial of half tablet April 2025 ) and valsartan  320mg  A/P: Blood pressure well-controlled-continue current medication  Recommended follow up: Return for as needed for new, worsening, persistent symptoms. otherwise we have a visit in a month. . Future Appointments  Date Time Provider Department Center  08/05/2023 11:30 AM Cecilie Coffee, RPH-CPP [facility] None  08/18/2023  9:30 AM Hilty, Aviva Lemmings, MD [facility]  08/23/2023 10:20 AM Garr Kalata, PA-C [facility]  08/25/2023  9:20 AM Almira Jaeger, MD [facility]  08/26/2023  8:45 AM Isidro Margo, DO [facility] None  10/18/2023  9:00 AM Antonio Baumgarten, NP [facility] None  12/06/2023  9:15 AM Carie Charity, NP [facility] H&V  04/27/2024 10:00 AM Jhonny Moss, MD [facility] None  06/15/2024 10:00 AM [facility] ANNUAL WELLNESS VISIT 1 [facility]    Lab/Order associations:   ICD-10-CM   1. Bacterial sinusitis  J32.9    B96.89     2. Seasonal allergic rhinitis due to pollen  J30.1 Ambulatory referral to Allergy    3. Atherosclerosis of native coronary artery of native heart without angina pectoris  I25.10     4. Essential hypertension  I10       Meds ordered this encounter  Medications   amoxicillin -clavulanate (AUGMENTIN ) 875-125 MG tablet    Sig: Take 1 tablet by mouth 2 (two) times daily for 4 days. 4 days more in addition to 10 days already taken    Dispense:  8 tablet    Refill:  0     Return precautions advised.  Clarisa Crooked, MD

## 2023-07-27 NOTE — Patient Instructions (Addendum)
 With ongoing fatigue and cough lets send back to allergist but trial at [facility]- lets do 14 days of Augmentin  because you have improved on that so much each time (7 days last time)   Recommended follow up: Return for as needed for new, worsening, persistent symptoms. otherwise we have a visit in a month. Joshua [name]

## 2023-07-28 DIAGNOSIS — N5082 Scrotal pain: Secondary | ICD-10-CM | POA: Diagnosis not present

## 2023-07-28 DIAGNOSIS — K5900 Constipation, unspecified: Secondary | ICD-10-CM | POA: Diagnosis not present

## 2023-07-28 DIAGNOSIS — N3946 Mixed incontinence: Secondary | ICD-10-CM | POA: Diagnosis not present

## 2023-07-28 DIAGNOSIS — M62838 Other muscle spasm: Secondary | ICD-10-CM | POA: Diagnosis not present

## 2023-08-04 ENCOUNTER — Telehealth: Payer: Self-pay | Admitting: Family Medicine

## 2023-08-04 NOTE — Telephone Encounter (Signed)
 Per Joshua [name] Patient requests to cancel appointment on 08/06/23. Patient requests to be called to be rescheduled.

## 2023-08-04 NOTE — Telephone Encounter (Signed)
 Already taken card of  - patient called me directly. Appt changed to 5/6 at 10:30

## 2023-08-05 ENCOUNTER — Other Ambulatory Visit: Payer: PPO | Admitting: Pharmacist

## 2023-08-06 ENCOUNTER — Other Ambulatory Visit: Payer: Self-pay | Admitting: Pharmacist

## 2023-08-06 NOTE — Progress Notes (Signed)
 08/06/2023 Name: Joshua [name] MRN: [id number] DOB: 1941-02-26  Chief Complaint  Patient presents with   Medication Management    Joshua [name] is [name] 83 y.o. year old male who presented for [name] telephone visit.   They were referred to the pharmacist by their PCP for assistance in managing medication access.    Subjective:  Care Team: Primary Care Provider: Almira Jaeger, Joshua [name] ; Next Scheduled Visit: 08/25/2023 Cardiologist: Joshua [name]; Next Scheduled Visit: 12/06/2023 Cardiologist: Dr Joshua [name] - lipid clinic; Next Scheduled Visit: 08/18/2023 Neurologist: Dr Joshua [name]; Next Scheduled Visit: 04/27/2024 Gastroenterology: Joshua [name]: Next Scheduled Visit: 08/23/2023 Sports Medicine: Dr Joshua [name], Z. : Next Scheduled Visit: 08/26/2023 Pulmonology: Joshua [name]: Next Scheduled Visit: 10/18/2023  Medication Access/Adherence  Current Pharmacy:  [id number] - Joshua [name], Joshua [name] - [address] Joshua [name] Phone: [phone] Fax: [phone]  [id number] [name], Joshua [name] - [address] Joshua [name] Joshua [name] Phone: [phone] Fax: [phone]  Joshua [name] - Joshua [name] - [facility] [name] Joshua [name] Joshua [name] Phone: [phone] Fax: [phone]   Patient reports affordability concerns with their medications: No  Patient reports access/transportation concerns to their pharmacy: No  Patient reports adherence concerns with their medications:  No      Asthma:  Current medications: Albuterol  HFA - inhale 1 or 2 puffs into lungs up to every 6 hours as needed for wheezing or shortness of breath. Per patient he has not used albuterol  in several months.   Medications tried in the past: Arnuity - patient stopped on own because he did not feel it was helpful.   Reports no asthma exacerbations in the past year but he has had [name] long course of Augmentin  recently to treat  sinsusitis. He will see allergist in 2 weeks for an initial assessment.   Current medication access support: none - we have gotten Arnuity from [employer] patient assistance program in past but their program requires that patient show proof of $600 out of pocket spend on medication in the current calendar year to qualify for assistance.    Medication Management:  Patient reports constipation has been controlled with current therapy - over-the-counter stool softener and Miralax . He has tried and failed the following: Benefiber, probiotics, Linzess , Amitiza . IBgard and Ibsrela 50 mg twice daily.  He has follow up with GI 08/23/2023.  He also was interested in Zepbound for obstructive sleep apnea. Last sleep study I see in his record was 12/11/2013. Per Dr Macie Saxon notes he has mild OSA but had worsened compared to sleep study from 2009. He has started using CPAP more recently and states last night he wore for 6 hours and felt that he slept well.   Wt Readings from Last 3 Encounters:  07/27/23 201 lb (91.2 kg)  07/23/23 201 lb (91.2 kg)  07/20/23 200 lb 3.2 oz (90.8 kg)     Objective:  Lab Results  Component Value Date   HGBA1C 5.8 05/25/2023    Lab Results  Component Value Date   CREATININE 1.24 07/10/2023   BUN 17 07/10/2023   NA 139 07/10/2023   K 3.9 07/10/2023   CL 104 07/10/2023   CO2 26 07/10/2023    Lab Results  Component Value Date   CHOL 115 06/29/2022   HDL 42 06/29/2022   LDLCALC 46 06/29/2022   LDLDIRECT 115.0 06/04/2016   TRIG 159 (H) 06/29/2022   CHOLHDL 2.7 06/29/2022    Medications  Reviewed Today     Reviewed by Joshua [name] (Pharmacist) on 08/06/23 at 1100  Med List Status: <None>   Medication Order Taking? Sig Documenting Provider Last Dose Status Informant  acetaminophen  (TYLENOL ) 500 MG tablet [id number]  Take 1,000 mg by mouth every 6 (six) hours as needed for moderate pain or headache. Provider, Historical, Joshua [name]  Active Spouse/Significant Other   albuterol  (PROAIR  HFA) 108 (90 Base) MCG/ACT inhaler [id number] No Inhale 1-2 puffs into the lungs every 6 (six) hours as needed for wheezing or shortness of breath.  Patient not taking: Reported on 08/06/2023   Joshua [name] Not Taking Active   Joshua [name]  (Joshua [name] ) 150 MG/ML SOAJ [id number] Yes INJECT 1 DOSE INTO THE SKIN EVERY 14 (FOURTEEN) DAYS. Joshua Lites, Joshua [name] Taking Active   amLODipine  (NORVASC ) 5 MG tablet [id number] Yes TAKE 1 TABLET (5 MG TOTAL) BY MOUTH DAILY. Joshua Lites, Joshua [name] Taking Active   aspirin  EC 81 MG tablet [id number]  Take 1 tablet (81 mg total) by mouth 2 (two) times daily after [name] meal.  Patient taking differently: Take 81 mg by mouth daily.   Joshua [name]  Active Spouse/Significant Other           Med Note Joshua [name]   Fri Aug 06, 2023 10:53 AM)    cholecalciferol (VITAMIN D3) 25 MCG (1000 UNIT) tablet [id number] Yes Take 1,000 Units by mouth daily. Provider, Historical, Joshua [name] Taking Active Spouse/Significant Other  Cyanocobalamin  ([name]-12) 2500 MCG TABS [id number] Yes Take 1,250 mcg by mouth daily. Provider, Historical, Joshua [name] Taking Active Spouse/Significant Other  diazepam  (VALIUM ) 5 MG tablet [id number]  TAKE 1 TABLET (5 MG TOTAL) BY MOUTH AT BEDTIME AS NEEDED (SLEEP).  Patient not taking: Reported on 07/27/2023   Joshua Jaeger, Joshua [name]  Active   dicyclomine  (BENTYL ) 10 MG capsule [id number] Yes Take 1 capsule (10 mg total) by mouth in the morning, at noon, and at bedtime. Joshua [name] Taking Active   docusate sodium  (COLACE) 100 MG capsule [id number] Yes Take 100 mg by mouth 2 (two) times daily as needed for mild constipation. Provider, Historical, Joshua [name] Taking Active   EPINEPHrine  0.3 mg/0.3 mL IJ SOAJ injection [id number]  Inject 0.3 mg into the muscle as needed for anaphylaxis. for allergic reaction Provider, Historical, Joshua [name]  Active            Med Note (Joshua [name]   Wed Jan 29, 2021  9:10 AM) On hand  ezetimibe  (ZETIA ) 10 MG tablet [id number] Yes  TAKE 1 TABLET BY MOUTH EVERY DAY Joshua [name] Taking Active   fluticasone  (FLONASE ) 50 MCG/ACT nasal spray [id number] Yes Place 1 spray into both nostrils daily. Joshua [name] Taking Active Spouse/Significant Other  folic acid  (FOLVITE ) 800 MCG tablet [id number]  Take 800 mcg by mouth daily.  Provider, Historical, Joshua [name]  Active Spouse/Significant Other           Med Note Reta Cassis, Juanna Norman   Fri Nov 01, 2020  9:34 AM) Pt is taking 800 mcg unable to find 400 mcg.  gabapentin  (NEURONTIN ) 300 MG capsule [id number] Yes Take 2 cap in AM, 1 cap at noon, 3 caps at bedtime Jhonny Moss, Joshua [name] Taking Active            Med Note Joshua [name], [facility] [name]   Fri Aug 06, 2023 10:54 AM) Per patient he cut back to 2 in morning, 1 at noon and 2 at bedtime  Glucos-Chond-Hyal Ac-Ca Fructo (MOVE FREE JOINT  HEALTH ADVANCE PO) [id number] Yes Take 1 tablet by mouth daily. Provider, Historical, Joshua [name] Taking Active Spouse/Significant Other  hydrochlorothiazide  (HYDRODIURIL ) 25 MG tablet [id number] Yes TAKE 1 TABLET BY MOUTH EVERY DAY  Patient taking differently: Take 12.5 mg by mouth daily.   Joshua Jaeger, Joshua [name] Taking Active   ketoconazole (NIZORAL) 2 % cream [id number] Yes Apply 1 Application topically 2 (two) times daily as needed. Provider, Historical, Joshua [name] Taking Active   Lidocaine  HCl (ASPERCREME LIDOCAINE ) 4 % CREA [id number]  Apply topically in the morning and at bedtime. Provider, Historical, Joshua [name]  Active   Loratadine  (CLARITIN  PO) [id number] Yes Take by mouth at bedtime. Provider, Historical, Joshua [name] Taking Active   mirabegron  ER (MYRBETRIQ ) 50 MG TB24 tablet [id number] Yes Take 1 tablet (50 mg total) by mouth daily. Joshua Jaeger, Joshua [name] Taking Active   nitroGLYCERIN  (NITROSTAT ) 0.4 MG SL tablet [id number]  Place 1 tablet (0.4 mg total) under the tongue every 5 (five) minutes as needed for chest pain. Joshua Jaeger, Joshua [name]  Active            Med Note (RATLIFF, [facility]   Fri Jun 25, 2023 10:23 AM) As needed  pantoprazole  (PROTONIX )  40 MG tablet [id number] Yes TAKE 1 TABLET BY MOUTH EVERY DAY Hilty, Aviva Lemmings, Joshua [name] Taking Active   polyethylene glycol powder (GLYCOLAX /MIRALAX ) powder [id number] Yes Take 17 g by mouth daily. Eino Gravel, Joshua [name] Taking Active   valsartan  (DIOVAN ) 320 MG tablet [id number] Yes TAKE 1 TABLET BY MOUTH EVERY DAY Joshua Jaeger, Joshua [name] Taking Active               Assessment/Plan:   Asthma: - Recommend to use albuterol  as needed for wheezing and shortness of breath. Contact office if using more than 2 days per week.  - Discussed with allergist / pulmonology restarting Arnuity or other maintenance inhaler.  - If needed in the future, patient will contact Clinical Pharmacist Practitioner for assistance with medication costs.     Medication Management: - Reviewed medication list and discussed adherence.  - Discussed Zepbound for OSA. He will discuss with pulmonology at upcoming appointment. Would likely need new sleep study that demonstrated worsening OSA - moderate to severe, in order for Zepbound to be approved for OSA and then if prior authorization approved, copay might still be $100 + per month. Patient does have other conditions that would make GLP1 therapy / weight loss beneficial - chronic back pain, osteoarthritis, GERD, venous insufficiency, atherosclerosis.  - Reviewed [employer] coverage for medications for IBS - constipation. Tier 3 meds ($47 / month) - Linzess , Motegrity, prucalopride. Tier 4 meds ($100/month) -lubiprostone  (generic Linzess ). Tier 5 (33% of med cost or $500/ month) - Relistor  Patient to continue over-the-counter stool softener and Miralax  for constipation  Follow Up Plan: patient to contact Clinical Pharmacist Practitioner if future need arises regarding medication cost, access or medication related questions.   Joshua [name], PharmD Clinical Pharmacist [facility]  Population Health [phone]

## 2023-08-12 ENCOUNTER — Other Ambulatory Visit (HOSPITAL_COMMUNITY): Payer: Self-pay | Admitting: Family Medicine

## 2023-08-12 DIAGNOSIS — E785 Hyperlipidemia, unspecified: Secondary | ICD-10-CM | POA: Diagnosis not present

## 2023-08-13 ENCOUNTER — Ambulatory Visit (HOSPITAL_BASED_OUTPATIENT_CLINIC_OR_DEPARTMENT_OTHER): Payer: Self-pay | Admitting: Internal Medicine

## 2023-08-13 LAB — LIPID PANEL
Chol/HDL Ratio: 2.6 ratio (ref 0.0–5.0)
Cholesterol, Total: 117 mg/dL (ref 100–199)
HDL: 45 mg/dL (ref >39)
LDL Chol Calc (NIH): 45 mg/dL (ref 0–99)
Triglycerides: 161 mg/dL — ABNORMAL HIGH (ref 0–149)
VLDL Cholesterol Cal: 27 mg/dL (ref 5–40)

## 2023-08-18 ENCOUNTER — Encounter (HOSPITAL_BASED_OUTPATIENT_CLINIC_OR_DEPARTMENT_OTHER): Payer: Self-pay | Admitting: Internal Medicine

## 2023-08-18 ENCOUNTER — Other Ambulatory Visit (HOSPITAL_BASED_OUTPATIENT_CLINIC_OR_DEPARTMENT_OTHER): Payer: Self-pay

## 2023-08-18 ENCOUNTER — Ambulatory Visit (HOSPITAL_BASED_OUTPATIENT_CLINIC_OR_DEPARTMENT_OTHER): Payer: PPO | Admitting: Internal Medicine

## 2023-08-18 VITALS — BP 104/50 | HR 44 | Ht 66.2 in | Wt 202.4 lb

## 2023-08-18 DIAGNOSIS — T466X5D Adverse effect of antihyperlipidemic and antiarteriosclerotic drugs, subsequent encounter: Secondary | ICD-10-CM

## 2023-08-18 DIAGNOSIS — E785 Hyperlipidemia, unspecified: Secondary | ICD-10-CM | POA: Diagnosis not present

## 2023-08-18 DIAGNOSIS — M791 Myalgia, unspecified site: Secondary | ICD-10-CM | POA: Diagnosis not present

## 2023-08-18 DIAGNOSIS — I25810 Atherosclerosis of coronary artery bypass graft(s) without angina pectoris: Secondary | ICD-10-CM | POA: Diagnosis not present

## 2023-08-18 DIAGNOSIS — T466X5A Adverse effect of antihyperlipidemic and antiarteriosclerotic drugs, initial encounter: Secondary | ICD-10-CM

## 2023-08-18 MED ORDER — COMIRNATY 30 MCG/0.3ML IM SUSY
0.3000 mL | PREFILLED_SYRINGE | Freq: Once | INTRAMUSCULAR | 0 refills | Status: AC
  Filled 2023-08-18: qty 0.3, 1d supply, fill #0

## 2023-08-18 NOTE — Progress Notes (Signed)
 [name] [address] [place] [address] Phone: [phone] Subjective:   [name], am serving as a scribe for Dr. Ronnell Coins.  I'm seeing this patient by the request  of:  Almira Jaeger, MD  CC: Continued pain  [id number]  06/28/2023 Severe degenerative disc disease noted.  Discussed icing regimen and home exercises.  Discussed which activities throughout the can be beneficial in which ones can be harmful.  We have tried multiple different medications and patient has multiple different allergies or contraindications.  I did discuss a new potential medication which is a peripheral sodium channel blocker.  Would like to see what his cardiology thinks of this medicine if it would be beneficial.  If we could help him with some of the peripheral pain and neuropathy as well as his low back pain we could potentially improve his quality of life.  Will send a note to his cardiologist to discuss further with the potential risk of arrhythmia is there even though it is low.  Patient otherwise will follow-up with me again in 2 to 3 months.  Total time with patient as well as reviewing outside records 31 minutes      Update 08/26/2023 Joshua [name] is a 83 y.o. male coming in with complaint of lumbar spine pain. Patient states back and knees are bad. Trouble with L shoulder while doing wall push ups. Arthritis in right hand flares. Pelvic floor therapy is doing well.    Reviewing patient's chart since we have seen him been seen by numerous specialists including cardiology, family medicine, gastroenterology.  Was even seen in the emergency room for increasing fatigue.  Patient had a CT of the head that was independently visualized by me showing very small chronic vessel ischemic changes but otherwise fairly unremarkable.  Past Medical History:  Diagnosis Date   Acute medial meniscal tear    Anemia 2015   Arthritis    "middle finger right hand;  right knee; neck" (02/06/2014)   Asthma    seasonal   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"   CAD (coronary artery disease)    CABG 2001   Diverticulosis    DJD (degenerative joint disease)    BACK   GERD (gastroesophageal reflux disease)    H/O inguinal hernia repair 12/2018   History of gout    Hyperlipidemia    Hypertension    IBS (irritable bowel syndrome)    Obesity    OSA (obstructive sleep apnea)    USES CPAP    Pancreatitis ~ 1980   Peripheral vascular disease (HCC)    Pre-diabetes    Prostate cancer (HCC) 04/2010   Past Surgical History:  Procedure Laterality Date   ANTERIOR CERVICAL DECOMP/DISCECTOMY FUSION  05/26/2006   PLATE PLACED   BACK SURGERY     CARDIAC CATHETERIZATION  11/20/1999   CORONARY ARTERY BYPASS GRAFT  11/20/1999   SVG-RI1-RI2, SVG-OM, SVG-dRCA   CORONARY STENT INTERVENTION N/A 04/13/2019   Procedure: CORONARY STENT INTERVENTION;  Surgeon: Odie Benne, MD;  Location: [facility];  Service: Cardiovascular;  Laterality: N/A;   HERNIA REPAIR     INGUINAL HERNIA REPAIR Left 01/02/2019   Procedure: OPEN LEFT INGUINAL HERNIA REPAIR WITH MESH;  Surgeon: Jacolyn Matar, MD;  Location: [facility];  Service: General;  Laterality: Left;   IR EMBO ARTERIAL NOT HEMORR HEMANG INC GUIDE ROADMAPPING  03/08/2023   IR RADIOLOGIST EVAL & MGMT  02/05/2023  IR RADIOLOGIST EVAL & MGMT  04/05/2023   IR RADIOLOGIST EVAL & MGMT  04/09/2023   IR RADIOLOGIST EVAL & MGMT  04/30/2023   KNEE ARTHROSCOPY Right 1992; 04/2008   LAPAROSCOPIC CHOLECYSTECTOMY  2007   LEFT HEART CATH AND CORS/GRAFTS ANGIOGRAPHY N/A 04/13/2019   Procedure: LEFT HEART CATH AND CORS/GRAFTS ANGIOGRAPHY;  Surgeon: Odie Benne, MD;  Location: [facility];  Service: Cardiovascular;  Laterality: N/A;   NASAL SEPTUM SURGERY Right 2007   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   ROBOT ASSISTED LAPAROSCOPIC RADICAL PROSTATECTOMY  04/2010   THYROIDECTOMY,  PARTIAL  1980   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   Procedure: LEFT TOTAL KNEE ARTHROPLASTY;  Surgeon: Dayne Even, MD;  Location: [facility];  Service: Orthopedics;  Laterality: Left;   Social History   Socioeconomic History   Marital status: Married    Spouse name: Not on file   Number of children: Not on file   Years of education: Not on file   Highest education level: Associate degree: occupational, Scientist, product/process development, or vocational program  Occupational History   Occupation: retired    Associate Professor: RETIRED  Tobacco Use   Smoking status: Former    Current packs/day: 0.00    Average packs/day: 1 pack/day for 35.0 years (35.0 ttl pk-yrs)    Types: Cigarettes    Start date: 06/16/1957    Quit date: 06/16/1992    Years since quitting: 31.2   Smokeless tobacco: Never  Vaping Use   Vaping status: Never Used  Substance and Sexual Activity   Alcohol use: Yes    Alcohol/week: 2.0 standard drinks of alcohol    Types: 2 Shots of liquor per week    Comment: occ   Drug use: No   Sexual activity: Not Currently    Partners: Female  Other Topics Concern   Not on file  Social History Narrative   Married 42 years in 2015. 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      Hobbies: poker, movies and tv, staying active      Right handed   2 level home with steps he uses   Social Drivers of Corporate investment banker Strain: Low Risk  (06/10/2023)   Overall Financial Resource Strain (CARDIA)    Difficulty of Paying Living Expenses: Not hard at all  Food Insecurity: No Food Insecurity (06/10/2023)   Hunger Vital Sign    Worried About Running Out of Food in the Last Year: Never true    Ran Out of Food in the Last Year: Never true  Transportation Needs: No Transportation Needs (06/10/2023)   PRAPARE - Administrator, Civil Service (Medical): No    Lack of Transportation (Non-Medical): No  Physical Activity:  Insufficiently Active (06/10/2023)   Exercise Vital Sign    Days of Exercise per Week: 4 days    Minutes of Exercise per Session: 10 min  Stress: No Stress Concern Present (06/10/2023)   [employer] of Occupational Health - Occupational Stress Questionnaire    Feeling of Stress : Not at all  Recent Concern: Stress - Stress Concern Present (05/21/2023)   [employer] of Occupational Health - Occupational Stress Questionnaire    Feeling of Stress : To some extent  Social Connections: Moderately Isolated (06/10/2023)   Social Connection and Isolation Panel [NHANES]    Frequency of Communication with Friends and Family:  More than three times a week    Frequency of Social Gatherings with Friends and Family: More than three times a week    Attends Religious Services: Never    Database administrator or Organizations: No    Attends Engineer, structural: Never    Marital Status: Married   Allergies  Allergen Reactions   Codeine Other (See Comments)    Trouble breathing   Lexapro  [Escitalopram ]     Dizziness   Bromfed Nausea And Vomiting   Clarithromycin Other (See Comments)     gastritis   Cymbalta  [Duloxetine  Hcl]     Abdominal pain and later chest pain as well as worsening fatigue    Doxycycline Hyclate Nausea And Vomiting   Modafinil Other (See Comments)     anxiety-nervousness   Oxycodone -Acetaminophen  Itching   Promethazine Hcl Other (See Comments)     fainting   Quinolones Nausea Only    Cipro  "felt real bad"   Telithromycin Nausea And Vomiting   Tramadol  Nausea Only   Benadryl [Diphenhydramine] Other (See Comments)    Pt told not to take because of bypass surgery   Hydrocodone -Acetaminophen  Rash   Repatha  [Evolocumab ]     Weak, worn out   Statins Other (See Comments)    Leg cramps and makes patient feel bad. Per pt tried multiple in years past   Sulfonamide Derivatives Rash   Family History  Problem Relation Age of Onset   Heart disease Mother     Hyperlipidemia Mother    Heart disease Father    Hyperlipidemia Father    Diabetes Brother    Colon cancer Neg Hx    Pancreatic cancer Neg Hx    Esophageal cancer Neg Hx    Stomach cancer Neg Hx    Liver cancer Neg Hx     Current Outpatient Medications (Endocrine & Metabolic):    levothyroxine (SYNTHROID) 25 MCG tablet, Take 1 tablet (25 mcg total) by mouth daily before breakfast.  Current Outpatient Medications (Cardiovascular):    Alirocumab  (PRALUENT ) 150 MG/ML SOAJ, INJECT 1 DOSE INTO THE SKIN EVERY 14 (FOURTEEN) DAYS.   amLODipine  (NORVASC ) 5 MG tablet, TAKE 1 TABLET (5 MG TOTAL) BY MOUTH DAILY.   EPINEPHrine  0.3 mg/0.3 mL IJ SOAJ injection, Inject 0.3 mg into the muscle as needed for anaphylaxis. for allergic reaction   ezetimibe  (ZETIA ) 10 MG tablet, TAKE 1 TABLET BY MOUTH EVERY DAY   hydrochlorothiazide  (HYDRODIURIL ) 25 MG tablet, TAKE 1 TABLET BY MOUTH EVERY DAY (Patient taking differently: Take 12.5 mg by mouth daily.)   nitroGLYCERIN  (NITROSTAT ) 0.4 MG SL tablet, Place 1 tablet (0.4 mg total) under the tongue every 5 (five) minutes as needed for chest pain.   valsartan  (DIOVAN ) 320 MG tablet, TAKE 1 TABLET BY MOUTH EVERY DAY  Current Outpatient Medications (Respiratory):    albuterol  (PROAIR  HFA) 108 (90 Base) MCG/ACT inhaler, Inhale 1-2 puffs into the lungs every 6 (six) hours as needed for wheezing or shortness of breath.   fluticasone  (FLONASE ) 50 MCG/ACT nasal spray, Place 1 spray into both nostrils daily.   Loratadine  (CLARITIN  PO), Take by mouth at bedtime.  Current Outpatient Medications (Analgesics):    acetaminophen  (TYLENOL ) 500 MG tablet, Take 1,000 mg by mouth every 6 (six) hours as needed for moderate pain or headache.   aspirin  EC 81 MG tablet, Take 1 tablet (81 mg total) by mouth 2 (two) times daily after a meal. (Patient taking differently: Take 81 mg by mouth daily.)  Current Outpatient Medications (  Hematological):    Cyanocobalamin  (B-12) 2500 MCG TABS,  Take 1,250 mcg by mouth daily.   folic acid  (FOLVITE ) 800 MCG tablet, Take 800 mcg by mouth daily.   Current Outpatient Medications (Other):    cholecalciferol (VITAMIN D3) 25 MCG (1000 UNIT) tablet, Take 1,000 Units by mouth daily.   diazepam  (VALIUM ) 5 MG tablet, TAKE 1 TABLET (5 MG TOTAL) BY MOUTH AT BEDTIME AS NEEDED (SLEEP). (Patient not taking: Reported on 08/25/2023)   dicyclomine  (BENTYL ) 10 MG capsule, Take 1 capsule (10 mg total) by mouth in the morning, at noon, and at bedtime.   docusate sodium  (COLACE) 100 MG capsule, Take 100 mg by mouth 2 (two) times daily as needed for mild constipation.   gabapentin  (NEURONTIN ) 300 MG capsule, Take 2 cap in AM, 1 cap at noon, 3 caps at bedtime   Glucos-Chond-Hyal Ac-Ca Fructo (MOVE FREE JOINT HEALTH ADVANCE PO), Take 1 tablet by mouth daily.   ketoconazole (NIZORAL) 2 % cream, Apply 1 Application topically 2 (two) times daily as needed.   Lidocaine  HCl (ASPERCREME LIDOCAINE ) 4 % CREA, Apply topically in the morning and at bedtime.   mirabegron  ER (MYRBETRIQ ) 50 MG TB24 tablet, Take 1 tablet (50 mg total) by mouth daily.   pantoprazole  (PROTONIX ) 40 MG tablet, TAKE 1 TABLET BY MOUTH EVERY DAY   polyethylene glycol powder (GLYCOLAX /MIRALAX ) powder, Take 17 g by mouth daily.   Prucalopride Succinate (MOTEGRITY) 2 MG TABS, Take 1 tablet (2 mg total) by mouth daily.   Reviewed prior external information including notes and imaging from  primary care provider As well as notes that were available from care everywhere and other healthcare systems.  Past medical history, social, surgical and family history all reviewed in electronic medical record.  No pertanent information unless stated regarding to the chief complaint.   Review of Systems:  No headache, visual changes, nausea, vomiting, diarrhea, constipation, dizziness, abdominal pain, skin rash, fevers, chills, night sweats, weight loss, swollen lymph nodes,  joint swelling, chest pain, shortness  of breath, mood changes. POSITIVE muscle aches, body aches  Objective  Blood pressure 110/74, pulse 65, height 5\' 6"  (1.676 m), weight 204 lb (92.5 kg), SpO2 95%.   General: No apparent distress alert and oriented x3 mood and affect normal, dressed appropriately.  HEENT: Pupils equal, extraocular movements intact  Respiratory: Patient's speak in full sentences and does not appear short of breath  Cardiovascular: No lower extremity edema, non tender, no erythema  Sitting relatively actively comfortably.  Still has some tenderness to palpation in the paraspinal musculature.  Some difficulty in going from seated to standing but then able to ambulate without any significant difficulty.  Negative straight leg test noted today.    Impression and Recommendations:    The above documentation has been reviewed and is accurate and complete Zyla Dascenzo M Jaylan Hinojosa, DO

## 2023-08-18 NOTE — Progress Notes (Signed)
 LIPID CLINIC NOTE  Chief Complaint:  Follow-up  Primary Care Physician: Almira Jaeger, MD  Primary Cardiologist:  Eilleen Grates, MD  HPI:  Joshua [name] is a 83 y.o. male with a history of 5 vessel bypass more than 20 years ago and recent symptoms of unstable angina with PCI to the origin to the proximal graft lesion of the circumflex in January 2021.  He also has a history of dyslipidemia, hypertension, obstructive sleep apnea, prostate cancer and bladder cancer in the past.  He has been intolerant to numerous statins and had been maintained on Repatha  with significant reduction in his lipids with LDL particularly into the 13Y in the past however noted over the 2 years that he was taking the medication that he had side effects which initially lasted for 24 to 48 hours then up to 72 to 96 hours after each injection.  He said it was getting to the point where he felt it was intolerable and then was advised to discontinue the medicine.  Currently on simvastatin  20 mg daily which she seems to be able to tolerate, however her lipid profile 2 months ago showed total cholesterol 215, triglycerides 240, HDL 38 and LDL 134.  He remains still well above target LDL less than 70.  This visit today discussed options to see if we can get him to target.  04/24/2020   Joshua [name] returns today for follow-up.  He is actually doing very well with recent changes in his lipid management.  He is tolerating addition of ezetimibe .  Total cholesterol now is 155, HDL 41, LDL 84 (decreased from 134) and triglycerides 176.  His target LDL is less than 70 and he is nearing that.  We discussed other possible options.  He says he is looking forward to becoming more active in the near term after having some work possibly done on his orthopedic issues.  Hopefully this will help improve his numbers further.  Since he cannot tolerate the antibody PCSK9 inhibitors, we also discussed the possibility of inclisiran.  This is  now FDA approved but has not yet made it into patients in our area.  Cost may be an issue however we are still enrolling patients in the Orion-4 trial and he may be a candidate for that.  11/01/2020  Joshua [name] continues to do well.  His lipids have been fairly stable if not slightly improved.  Total cholesterol now 161, HDL 50, triglycerides 147 and LDL 85.  He is on simvastatin  and ezetimibe .  He cannot tolerate other statins and had an antibody reaction to the PCSK9 inhibitors causing immune/flulike symptoms.  I suspect he will not likely tolerate Praluent  for similar mechanism.  Inclisiran would likely be too costly at this point and were having difficulty getting into patients.  His options are otherwise limited for further reduction to target LDL less than 70.  He has been quite stable.  He continues to have problems with his knee which limits his activity.  12/20/2020  Joshua [name] is seen today for follow-up.  He had some concerns about his blood pressure, specifically he has stopped his simvastatin .  I told him that this was not a blood pressure medication in fact his blood pressure is excellent today 128/72.  The simvastatin  was stopped due to side effects including myalgias which did go away significantly after discontinuing it.  He remains on low-dose ezetimibe .  Cholesterol will undoubtedly increase.  He unfortunate did not qualify for Orion-4 due to the  fact that he had not had a prior cardiovascular event although he has had bypass.  I did discuss with him the fact that he could potentially get commercially available inclisiran, now marketed as Leqvio.  He does have Medicare however health team advantage plan and does not have supplemental coverage and cost therefore may be untenable for him.  05/26/2021  Joshua [name] was seen today in follow-up.  He is done very well on he went with marked reduction in his lipids.  Total cholesterol 129, triglycerides 149, HDL 52 and LDL 48.  He is tolerating  this without any issues.  He was able to get a grant and therefore the medicine is covered.  07/13/2022  Joshua [name] today in follow-up.  His lipids continue to be well-controlled.  Total cholesterol 115, triglycerides 154, HDL 42 and LDL 46.  He is on combination lower dose Praluent  75 mg daily and ezetimibe .  Seems to be doing well without any chest pain issues.  His main concerns are with bilateral knee pain.  08/18/2023  Joshua [name] was seen today in follow-up.  He continues to do well with regards to his lipids.  Recent labs showed total cholesterol 117, triglycerides 161, HDL 45 and LDL 45.  He was seen recently by Dr. Lavonne Prairie.  He has had some shortness of breath but not with exercise.  PMHx:  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"   CAD (coronary artery disease)    CABG 2001   Diverticulosis    DJD (degenerative joint disease)    BACK   GERD (gastroesophageal reflux disease)    H/O inguinal hernia repair 12/2018   History of gout    Hyperlipidemia    Hypertension    IBS (irritable bowel syndrome)    Obesity    OSA (obstructive sleep apnea)    USES CPAP    Pancreatitis ~ 1980   Peripheral vascular disease (HCC)    Pre-diabetes    Prostate cancer (HCC) 04/2010    Past Surgical History:  Procedure Laterality Date   ANTERIOR CERVICAL DECOMP/DISCECTOMY FUSION  05/26/2006   PLATE PLACED   BACK SURGERY     CARDIAC CATHETERIZATION  11/20/1999   CORONARY ARTERY BYPASS GRAFT  11/20/1999   SVG-RI1-RI2, SVG-OM, SVG-dRCA   CORONARY STENT INTERVENTION N/A 04/13/2019   Procedure: CORONARY STENT INTERVENTION;  Surgeon: Odie Benne, MD;  Location: [facility];  Service: Cardiovascular;  Laterality: N/A;   HERNIA REPAIR     INGUINAL HERNIA REPAIR Left  01/02/2019   Procedure: OPEN LEFT INGUINAL HERNIA REPAIR WITH MESH;  Surgeon: Jacolyn Matar, MD;  Location: [facility];  Service: General;  Laterality: Left;   IR EMBO ARTERIAL NOT HEMORR HEMANG INC GUIDE ROADMAPPING  03/08/2023   IR RADIOLOGIST EVAL & MGMT  02/05/2023   IR RADIOLOGIST EVAL & MGMT  04/05/2023   IR RADIOLOGIST EVAL & MGMT  04/09/2023   IR RADIOLOGIST EVAL & MGMT  04/30/2023   KNEE ARTHROSCOPY Right 1992; 04/2008   LAPAROSCOPIC CHOLECYSTECTOMY  2007   LEFT HEART CATH AND CORS/GRAFTS ANGIOGRAPHY N/A 04/13/2019   Procedure: LEFT HEART CATH AND CORS/GRAFTS ANGIOGRAPHY;  Surgeon: Odie Benne, MD;  Location: [facility];  Service: Cardiovascular;  Laterality: N/A;  NASAL SEPTUM SURGERY Right 2007   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   ROBOT ASSISTED LAPAROSCOPIC RADICAL PROSTATECTOMY  04/2010   THYROIDECTOMY, PARTIAL  1980   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   Procedure: LEFT TOTAL KNEE ARTHROPLASTY;  Surgeon: Dayne Even, MD;  Location: [facility];  Service: Orthopedics;  Laterality: Left;    FAMHx:  Family History  Problem Relation Age of Onset   Heart disease Mother    Hyperlipidemia Mother    Heart disease Father    Hyperlipidemia Father    Diabetes Brother    Colon cancer Neg Hx    Pancreatic cancer Neg Hx    Esophageal cancer Neg Hx    Stomach cancer Neg Hx    Liver cancer Neg Hx     SOCHx:   reports that he quit smoking about 31 years ago. His smoking use included cigarettes. He started smoking about 66 years ago. He has a 35 pack-year smoking history. He has never used smokeless tobacco. He reports current alcohol use of about 2.0 standard drinks of alcohol per week. He reports that he does not use drugs.  ALLERGIES:  Allergies  Allergen Reactions   Codeine Other (See Comments)    Trouble breathing   Lexapro  [Escitalopram ]     Dizziness   Bromfed Nausea And Vomiting   Clarithromycin Other (See Comments)     gastritis   Cymbalta   [Duloxetine  Hcl]     Abdominal pain and later chest pain as well as worsening fatigue    Doxycycline Hyclate Nausea And Vomiting   Modafinil Other (See Comments)     anxiety-nervousness   Oxycodone -Acetaminophen  Itching   Promethazine Hcl Other (See Comments)     fainting   Quinolones Nausea Only    Cipro  "felt real bad"   Telithromycin Nausea And Vomiting   Tramadol  Nausea Only   Benadryl [Diphenhydramine] Other (See Comments)    Pt told not to take because of bypass surgery   Hydrocodone -Acetaminophen  Rash   Repatha  [Evolocumab ]     Weak, worn out   Statins Other (See Comments)    Leg cramps and makes patient feel bad. Per pt tried multiple in years past   Sulfonamide Derivatives Rash    ROS: Pertinent items noted in HPI and remainder of comprehensive ROS otherwise negative.  HOME MEDS: Current Outpatient Medications on File Prior to Visit  Medication Sig Dispense Refill   acetaminophen  (TYLENOL ) 500 MG tablet Take 1,000 mg by mouth every 6 (six) hours as needed for moderate pain or headache.     Alirocumab  (PRALUENT ) 150 MG/ML SOAJ INJECT 1 DOSE INTO THE SKIN EVERY 14 (FOURTEEN) DAYS. 6 mL 3   amLODipine  (NORVASC ) 5 MG tablet TAKE 1 TABLET (5 MG TOTAL) BY MOUTH DAILY. 90 tablet 3   aspirin  EC 81 MG tablet Take 1 tablet (81 mg total) by mouth 2 (two) times daily after a meal. (Patient taking differently: Take 81 mg by mouth daily.) 30 tablet 11   cholecalciferol (VITAMIN D3) 25 MCG (1000 UNIT) tablet Take 1,000 Units by mouth daily.     Cyanocobalamin  (B-12) 2500 MCG TABS Take 1,250 mcg by mouth daily.     diazepam  (VALIUM ) 5 MG tablet TAKE 1 TABLET (5 MG TOTAL) BY MOUTH AT BEDTIME AS NEEDED (SLEEP). (Patient taking differently: Take 5 mg by mouth as needed (sleep).) 30 tablet 0   EPINEPHrine  0.3 mg/0.3 mL IJ SOAJ injection Inject 0.3 mg into the muscle as needed for anaphylaxis. for allergic reaction  ezetimibe  (ZETIA ) 10 MG tablet TAKE 1 TABLET BY MOUTH EVERY DAY 90 tablet  2   fluticasone  (FLONASE ) 50 MCG/ACT nasal spray Place 1 spray into both nostrils daily.     folic acid  (FOLVITE ) 800 MCG tablet Take 800 mcg by mouth daily.      gabapentin  (NEURONTIN ) 300 MG capsule Take 2 cap in AM, 1 cap at noon, 3 caps at bedtime 540 capsule 3   Glucos-Chond-Hyal Ac-Ca Fructo (MOVE FREE JOINT HEALTH ADVANCE PO) Take 1 tablet by mouth daily.     hydrochlorothiazide  (HYDRODIURIL ) 25 MG tablet TAKE 1 TABLET BY MOUTH EVERY DAY (Patient taking differently: Take 12.5 mg by mouth daily.) 90 tablet 2   ketoconazole (NIZORAL) 2 % cream Apply 1 Application topically 2 (two) times daily as needed.     Lidocaine  HCl (ASPERCREME LIDOCAINE ) 4 % CREA Apply topically in the morning and at bedtime.     Loratadine  (CLARITIN  PO) Take by mouth at bedtime.     mirabegron  ER (MYRBETRIQ ) 50 MG TB24 tablet Take 1 tablet (50 mg total) by mouth daily. 90 tablet 1   nitroGLYCERIN  (NITROSTAT ) 0.4 MG SL tablet Place 1 tablet (0.4 mg total) under the tongue every 5 (five) minutes as needed for chest pain. 30 tablet 5   pantoprazole  (PROTONIX ) 40 MG tablet TAKE 1 TABLET BY MOUTH EVERY DAY 90 tablet 3   polyethylene glycol powder (GLYCOLAX /MIRALAX ) powder Take 17 g by mouth daily. 255 g 0   valsartan  (DIOVAN ) 320 MG tablet TAKE 1 TABLET BY MOUTH EVERY DAY 90 tablet 3   albuterol  (PROAIR  HFA) 108 (90 Base) MCG/ACT inhaler Inhale 1-2 puffs into the lungs every 6 (six) hours as needed for wheezing or shortness of breath. (Patient not taking: Reported on 08/18/2023) 18 g 5   dicyclomine  (BENTYL ) 10 MG capsule Take 1 capsule (10 mg total) by mouth in the morning, at noon, and at bedtime. (Patient not taking: Reported on 08/18/2023) 90 capsule 2   docusate sodium  (COLACE) 100 MG capsule Take 100 mg by mouth 2 (two) times daily as needed for mild constipation. (Patient not taking: Reported on 08/18/2023)     No current facility-administered medications on file prior to visit.    LABS/IMAGING: No results found.  However, due to the size of the patient record, not all encounters were searched. Please check Results Review for a complete set of results. No results found.  LIPID PANEL:    Component Value Date/Time   CHOL 117 08/12/2023 0821   TRIG 161 (H) 08/12/2023 0821   HDL 45 08/12/2023 0821   CHOLHDL 2.6 08/12/2023 0821   CHOLHDL 3 05/13/2021 1101   VLDL 29.8 05/13/2021 1101   LDLCALC 45 08/12/2023 0821   LDLCALC 84 02/23/2020 0933   LDLDIRECT 115.0 06/04/2016 0919    WEIGHTS: Wt Readings from Last 3 Encounters:  08/18/23 202 lb 6.4 oz (91.8 kg)  07/27/23 201 lb (91.2 kg)  07/23/23 201 lb (91.2 kg)    VITALS: BP (!) 104/50   Pulse (!) 44   Ht 5' 6.2" (1.681 m)   Wt 202 lb 6.4 oz (91.8 kg)   SpO2 96%   BMI 32.47 kg/m   EXAM: Deferred  EKG: Deferred  ASSESSMENT: Mixed dyslipidemia, goal LDL less than 70 Coronary artery disease status post 5 vessel CABG (2001) Recent unstable angina and PCI January 2021 Hypertension Obstructive sleep apnea Statin intolerance-myalgias Repatha  intolerance  PLAN: 1.   Joshua [name] has had continued excellent control of his lipids on  current regimen with Praluent .  At this point I think he could follow-up with his primary cardiologist since prior authorization is easily obtained through our pharmacy techs.  I am happy to see him back as needed should his cholesterol become difficult to control.  Thanks for allowing me to participate in his care.  Follow-up as needed.  Joshua Lites, MD, [facility], FNLA, FACP  [place]  [facility]  Medical Director of the Advanced Lipid Disorders &  Cardiovascular Risk Reduction Clinic Diplomate of the American Board of Clinical Lipidology Attending Cardiologist  Direct Dial: [phone]  Fax: [phone]  Website:  [email or web address] 08/18/2023, 9:55 AM

## 2023-08-18 NOTE — Patient Instructions (Signed)
 Medication Instructions:  Your physician recommends that you continue on your current medications as directed. Please refer to the Current Medication list given to you today.  *If you need a refill on your cardiac medications before your next appointment, please call your pharmacy*  Follow-Up: At [facility], you and your health needs are our priority.  As part of our continuing mission to provide you with exceptional heart care, our providers are all part of one team.  This team includes your primary Cardiologist (physician) and Advanced Practice Providers or APPs (Physician Assistants and Nurse Practitioners) who all work together to provide you with the care you need, when you need it.  Your next appointment:   As needed with Dr. Lavonne Prairie

## 2023-08-20 ENCOUNTER — Ambulatory Visit: Admitting: Gastroenterology

## 2023-08-20 DIAGNOSIS — N3941 Urge incontinence: Secondary | ICD-10-CM | POA: Diagnosis not present

## 2023-08-20 DIAGNOSIS — R102 Pelvic and perineal pain: Secondary | ICD-10-CM | POA: Diagnosis not present

## 2023-08-20 DIAGNOSIS — K5900 Constipation, unspecified: Secondary | ICD-10-CM | POA: Diagnosis not present

## 2023-08-20 DIAGNOSIS — M62838 Other muscle spasm: Secondary | ICD-10-CM | POA: Diagnosis not present

## 2023-08-20 DIAGNOSIS — M6281 Muscle weakness (generalized): Secondary | ICD-10-CM | POA: Diagnosis not present

## 2023-08-23 ENCOUNTER — Encounter: Payer: Self-pay | Admitting: Gastroenterology

## 2023-08-23 ENCOUNTER — Ambulatory Visit: Admitting: Gastroenterology

## 2023-08-23 VITALS — BP 122/62 | HR 48 | Ht 66.0 in | Wt 200.0 lb

## 2023-08-23 DIAGNOSIS — K581 Irritable bowel syndrome with constipation: Secondary | ICD-10-CM | POA: Diagnosis not present

## 2023-08-23 MED ORDER — PRUCALOPRIDE SUCCINATE 2 MG PO TABS: 2.0000 mg | ORAL_TABLET | Freq: Every day | ORAL | 2 refills | Status: DC

## 2023-08-23 NOTE — Patient Instructions (Signed)
 We have sent the following medications to your pharmacy for you to pick up at your convenience: Motegrity 2 mg daily.   _______________________________________________________  If your blood pressure at your visit was 140/90 or greater, please contact your primary care physician to follow up on this.  _______________________________________________________  If you are age 81 or older, your body mass index should be between 23-30. Your Body mass index is 32.28 kg/m. If this is out of the aforementioned range listed, please consider follow up with your Primary Care Provider.  If you are age 8 or younger, your body mass index should be between 19-25. Your Body mass index is 32.28 kg/m. If this is out of the aformentioned range listed, please consider follow up with your Primary Care Provider.   ________________________________________________________  The [place] [facility] providers would like to encourage you to use MYCHART to communicate with providers for non-urgent requests or questions.  Due to long hold times on the telephone, sending your provider a message by [facility] may be a faster and more efficient way to get a response.  Please allow 48 business hours for a response.  Please remember that this is for non-urgent requests.  _______________________________________________________

## 2023-08-23 NOTE — Progress Notes (Signed)
 Chief Complaint: IBS-C Primary GI MD: Dr. Bridgett Camps  HPI: Discussed the use of AI scribe software for clinical note transcription with the patient, who gave verbal consent to proceed.  History of Present Illness Joshua [name] is an 83 year old male who presents with persistent bowel movement issues.  He has been experiencing chronic constipation despite trying multiple treatments including Miralax , Benefiber, probiotics, Linzess , Amitiza , Ibgard, dicyclomine , and Ibsrela. None of these treatments have been effective, and some have worsened his symptoms.  He has undergone a CT scan and a colonoscopy as part of his workup. Despite these interventions, he continues to experience issues with bowel movements, having two to three soft, formed bowel movements per day. However, he describes the stools as 'small little squiggly stools' and sometimes feels like he is not completely evacuating, which bothers him the most.  He experiences a sensation of constipation or incomplete evacuation about once a week, which sometimes results in soiling his underwear. He is currently undergoing pelvic floor physical therapy for associated pain, which has helped with the pain but not with bowel movements or spasms.  He experiences bowel spasms primarily in the morning, which usually subside after a bowel movement but can persist throughout the day. He has a history of using a medication that was effective for his symptoms many years ago, but it is no longer available in the [place] .  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 He is a fan of the [employer] and enjoys watching NFL games with his brother.   PREVIOUS GI WORKUP   05/07/2021 Colonoscopy for abdominal pain and bloating - One 4 mm polyp in the cecum, removed with a cold snare. Resected and retrieved.  - One 5 mm polyp in the descending colon, removed with a cold snare. Resected and retrieved.  - One 4 mm polyp in the proximal rectum, removed  with a cold snare. Resected and retrieved.  - One 9 mm polyp in the mid rectum, removed with a cold snare. Resected and retrieved. - Diverticulosis in the sigmoid colon, in the descending colon and in the ascending colon. - Internal hemorrhoids. - no repeat recommended   Diagnosis 1. Surgical [P], colon, descending and cecum, polyp (2) - TUBULAR ADENOMA, ONE FRAGMENT. NO HIGH GRADE DYSPLASIA OR MALIGNANCY. - POLYPOID COLONIC MUCOSA, ONE FRAGMENT. NO DYSPLASIA OR MALIGNANCY. 2. Surgical [P], colon, rectum, polyp (2) - TUBULAR ADENOMA, TWO FRAGMENTS. NO HIGH GRADE DYSPLASIA OR MALIGNANCY. - HYPERPLASTIC POLYP, TWO FRAGMENTS. NO DYSPLASIA OR MALIGNANCY.   Upper endoscopy in September 2010 showing a hiatal hernia and otherwise normal exam and also had colonoscopy at that same time showing moderate diverticulosis throughout the colon, no polyps and was to have a 10 year interval follow-up.   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"   CAD (coronary artery disease)    CABG 2001   Diverticulosis    DJD (degenerative joint disease)    BACK   GERD (gastroesophageal reflux disease)    H/O inguinal hernia repair 12/2018   History of gout    Hyperlipidemia    Hypertension    IBS (irritable bowel syndrome)    Obesity    OSA (obstructive sleep apnea)    USES  CPAP    Pancreatitis ~ 1980   Peripheral vascular disease (HCC)    Pre-diabetes    Prostate cancer (HCC) 04/2010    Past Surgical History:  Procedure Laterality Date   ANTERIOR CERVICAL DECOMP/DISCECTOMY FUSION  05/26/2006   PLATE PLACED   BACK SURGERY     CARDIAC CATHETERIZATION  11/20/1999   CORONARY ARTERY BYPASS GRAFT  11/20/1999   SVG-RI1-RI2, SVG-OM, SVG-dRCA   CORONARY STENT INTERVENTION N/A 04/13/2019   Procedure:  CORONARY STENT INTERVENTION;  Surgeon: Odie Benne, MD;  Location: [facility];  Service: Cardiovascular;  Laterality: N/A;   HERNIA REPAIR     INGUINAL HERNIA REPAIR Left 01/02/2019   Procedure: OPEN LEFT INGUINAL HERNIA REPAIR WITH MESH;  Surgeon: Jacolyn Matar, MD;  Location: [facility];  Service: General;  Laterality: Left;   IR EMBO ARTERIAL NOT HEMORR HEMANG INC GUIDE ROADMAPPING  03/08/2023   IR RADIOLOGIST EVAL & MGMT  02/05/2023   IR RADIOLOGIST EVAL & MGMT  04/05/2023   IR RADIOLOGIST EVAL & MGMT  04/09/2023   IR RADIOLOGIST EVAL & MGMT  04/30/2023   KNEE ARTHROSCOPY Right 1992; 04/2008   LAPAROSCOPIC CHOLECYSTECTOMY  2007   LEFT HEART CATH AND CORS/GRAFTS ANGIOGRAPHY N/A 04/13/2019   Procedure: LEFT HEART CATH AND CORS/GRAFTS ANGIOGRAPHY;  Surgeon: Odie Benne, MD;  Location: [facility];  Service: Cardiovascular;  Laterality: N/A;   NASAL SEPTUM SURGERY Right 2007   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   ROBOT ASSISTED LAPAROSCOPIC RADICAL PROSTATECTOMY  04/2010   THYROIDECTOMY, PARTIAL  1980   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   Procedure: LEFT TOTAL KNEE ARTHROPLASTY;  Surgeon: Dayne Even, MD;  Location: [facility];  Service: Orthopedics;  Laterality: Left;    Current Outpatient Medications  Medication Sig Dispense Refill   acetaminophen  (TYLENOL ) 500 MG tablet Take 1,000 mg by mouth every 6 (six) hours as needed for moderate pain or headache.     albuterol  (PROAIR  HFA) 108 (90 Base) MCG/ACT inhaler Inhale 1-2 puffs into the lungs every 6 (six) hours as needed for wheezing or shortness of breath. 18 g 5   Alirocumab  (PRALUENT ) 150 MG/ML SOAJ INJECT 1 DOSE INTO THE SKIN EVERY 14 (FOURTEEN) DAYS. 6 mL 3   amLODipine  (NORVASC ) 5 MG tablet TAKE 1 TABLET (5 MG TOTAL) BY MOUTH DAILY. 90 tablet 3   aspirin  EC 81 MG tablet Take 1 tablet (81 mg total) by mouth 2 (two) times daily after a meal. (Patient taking differently: Take 81 mg by mouth daily.) 30  tablet 11   cholecalciferol (VITAMIN D3) 25 MCG (1000 UNIT) tablet Take 1,000 Units by mouth daily.     Cyanocobalamin  (B-12) 2500 MCG TABS Take 1,250 mcg by mouth daily.     dicyclomine  (BENTYL ) 10 MG capsule Take 1 capsule (10 mg total) by mouth in the morning, at noon, and at bedtime. 90 capsule 2   docusate sodium  (COLACE) 100 MG capsule Take 100 mg by mouth 2 (two) times daily as needed for mild constipation.     EPINEPHrine  0.3 mg/0.3 mL IJ SOAJ injection Inject 0.3 mg into the muscle as needed for anaphylaxis. for allergic reaction     ezetimibe  (ZETIA ) 10 MG tablet TAKE 1 TABLET BY MOUTH EVERY DAY 90 tablet 2   fluticasone  (FLONASE ) 50 MCG/ACT nasal spray Place 1 spray into both nostrils daily.     folic acid  (FOLVITE ) 800 MCG tablet Take 800 mcg by mouth daily.      gabapentin  (  NEURONTIN ) 300 MG capsule Take 2 cap in AM, 1 cap at noon, 3 caps at bedtime 540 capsule 3   Glucos-Chond-Hyal Ac-Ca Fructo (MOVE FREE JOINT HEALTH ADVANCE PO) Take 1 tablet by mouth daily.     hydrochlorothiazide  (HYDRODIURIL ) 25 MG tablet TAKE 1 TABLET BY MOUTH EVERY DAY (Patient taking differently: Take 12.5 mg by mouth daily.) 90 tablet 2   ketoconazole (NIZORAL) 2 % cream Apply 1 Application topically 2 (two) times daily as needed.     Lidocaine  HCl (ASPERCREME LIDOCAINE ) 4 % CREA Apply topically in the morning and at bedtime.     Loratadine  (CLARITIN  PO) Take by mouth at bedtime.     mirabegron  ER (MYRBETRIQ ) 50 MG TB24 tablet Take 1 tablet (50 mg total) by mouth daily. 90 tablet 1   nitroGLYCERIN  (NITROSTAT ) 0.4 MG SL tablet Place 1 tablet (0.4 mg total) under the tongue every 5 (five) minutes as needed for chest pain. 30 tablet 5   pantoprazole  (PROTONIX ) 40 MG tablet TAKE 1 TABLET BY MOUTH EVERY DAY 90 tablet 3   polyethylene glycol powder (GLYCOLAX /MIRALAX ) powder Take 17 g by mouth daily. 255 g 0   Prucalopride Succinate (MOTEGRITY) 2 MG TABS Take 1 tablet (2 mg total) by mouth daily. 30 tablet 2    valsartan  (DIOVAN ) 320 MG tablet TAKE 1 TABLET BY MOUTH EVERY DAY 90 tablet 3   diazepam  (VALIUM ) 5 MG tablet TAKE 1 TABLET (5 MG TOTAL) BY MOUTH AT BEDTIME AS NEEDED (SLEEP). (Patient not taking: Reported on 08/23/2023) 30 tablet 0   No current facility-administered medications for this visit.    Allergies as of 08/23/2023 - Review Complete 08/23/2023  Allergen Reaction Noted   Codeine Other (See Comments)    Lexapro  [escitalopram ]  08/21/2020   Bromfed Nausea And Vomiting 12/14/2006   Clarithromycin Other (See Comments) 10/12/2006   Cymbalta  [duloxetine  hcl]  01/01/2022   Doxycycline hyclate Nausea And Vomiting 12/14/2006   Modafinil Other (See Comments) 12/14/2006   Oxycodone -acetaminophen  Itching 11/25/2016   Promethazine hcl Other (See Comments) 12/14/2006   Quinolones Nausea Only 09/20/2014   Telithromycin Nausea And Vomiting 12/14/2006   Tramadol  Nausea Only 07/29/2020   Benadryl [diphenhydramine] Other (See Comments) 02/06/2014   Hydrocodone -acetaminophen  Rash 12/14/2006   Repatha  [evolocumab ]  02/16/2022   Statins Other (See Comments) 04/14/2019   Sulfonamide derivatives Rash     Family History  Problem Relation Age of Onset   Heart disease Mother    Hyperlipidemia Mother    Heart disease Father    Hyperlipidemia Father    Diabetes Brother    Colon cancer Neg Hx    Pancreatic cancer Neg Hx    Esophageal cancer Neg Hx    Stomach cancer Neg Hx    Liver cancer Neg Hx     Social History   Socioeconomic History   Marital status: Married    Spouse name: Not on file   Number of children: Not on file   Years of education: Not on file   Highest education level: Associate degree: occupational, Scientist, product/process development, or vocational program  Occupational History   Occupation: retired    Associate Professor: RETIRED  Tobacco Use   Smoking status: Former    Current packs/day: 0.00    Average packs/day: 1 pack/day for 35.0 years (35.0 ttl pk-yrs)    Types: Cigarettes    Start date: 06/16/1957     Quit date: 06/16/1992    Years since quitting: 31.2   Smokeless tobacco: Never  Vaping Use   Vaping  status: Never Used  Substance and Sexual Activity   Alcohol use: Yes    Alcohol/week: 2.0 standard drinks of alcohol    Types: 2 Shots of liquor per week    Comment: occ   Drug use: No   Sexual activity: Not Currently    Partners: Female  Other Topics Concern   Not on file  Social History Narrative   Married 42 years in 2015. 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      Hobbies: poker, movies and tv, staying active      Right handed   2 level home with steps he uses   Social Drivers of Corporate investment banker Strain: Low Risk  (06/10/2023)   Overall Financial Resource Strain (CARDIA)    Difficulty of Paying Living Expenses: Not hard at all  Food Insecurity: No Food Insecurity (06/10/2023)   Hunger Vital Sign    Worried About Running Out of Food in the Last Year: Never true    Ran Out of Food in the Last Year: Never true  Transportation Needs: No Transportation Needs (06/10/2023)   PRAPARE - Administrator, Civil Service (Medical): No    Lack of Transportation (Non-Medical): No  Physical Activity: Insufficiently Active (06/10/2023)   Exercise Vital Sign    Days of Exercise per Week: 4 days    Minutes of Exercise per Session: 10 min  Stress: No Stress Concern Present (06/10/2023)   [employer] of Occupational Health - Occupational Stress Questionnaire    Feeling of Stress : Not at all  Recent Concern: Stress - Stress Concern Present (05/21/2023)   [employer] of Occupational Health - Occupational Stress Questionnaire    Feeling of Stress : To some extent  Social Connections: Moderately Isolated (06/10/2023)   Social Connection and Isolation Panel [NHANES]    Frequency of Communication with Friends and Family: More than three times a week    Frequency of Social Gatherings with  Friends and Family: More than three times a week    Attends Religious Services: Never    Database administrator or Organizations: No    Attends Banker Meetings: Never    Marital Status: Married  Catering manager Violence: Not At Risk (06/10/2023)   Humiliation, Afraid, Rape, and Kick questionnaire    Fear of Current or Ex-Partner: No    Emotionally Abused: No    Physically Abused: No    Sexually Abused: No    Review of Systems:    Constitutional: No weight loss, fever, chills, weakness or fatigue HEENT: Eyes: No change in vision               Ears, Nose, Throat:  No change in hearing or congestion Skin: No rash or itching Cardiovascular: No chest pain, chest pressure or palpitations   Respiratory: No SOB or cough Gastrointestinal: See HPI and otherwise negative Genitourinary: No dysuria or change in urinary frequency Neurological: No headache, dizziness or syncope Musculoskeletal: No new muscle or joint pain Hematologic: No bleeding or bruising Psychiatric: No history of depression or anxiety    Physical Exam:  Vital signs: BP 122/62   Pulse (!) 48   Ht 5\' 6"  (1.676 m)   Wt 200 lb (90.7 kg)   BMI 32.28 kg/m   Constitutional: NAD, alert and cooperative Head:  Normocephalic and atraumatic. Eyes:   PEERL, EOMI. No  icterus. Conjunctiva pink. Respiratory: Respirations even and unlabored. Lungs clear to auscultation bilaterally.   No wheezes, crackles, or rhonchi.  Cardiovascular:  Regular rate and rhythm. No peripheral edema, cyanosis or pallor.  Gastrointestinal:  Soft, nondistended, nontender. No rebound or guarding. Normal bowel sounds. No appreciable masses or hepatomegaly. Rectal:  Declines Msk:  Symmetrical without gross deformities. Without edema, no deformity or joint abnormality.  Neurologic:  Alert and  oriented x4;  grossly normal neurologically.  Skin:   Dry and intact without significant lesions or rashes. Psychiatric: Oriented to person, place and  time. Demonstrates good judgement and reason without abnormal affect or behaviors.   RELEVANT LABS AND IMAGING: CBC    Component Value Date/Time   WBC 7.9 07/10/2023 1254   RBC 4.77 07/10/2023 1254   HGB 14.9 07/10/2023 1254   HGB 14.8 12/23/2021 1552   HCT 41.8 07/10/2023 1254   HCT 42.8 12/23/2021 1552   PLT 207 07/10/2023 1254   PLT 274 12/23/2021 1552   MCV 87.6 07/10/2023 1254   MCV 89 12/23/2021 1552   MCH 31.2 07/10/2023 1254   MCHC 35.6 07/10/2023 1254   RDW 12.0 07/10/2023 1254   RDW 11.9 12/23/2021 1552   LYMPHSABS 1.4 07/10/2023 1254   MONOABS 0.8 07/10/2023 1254   EOSABS 0.4 07/10/2023 1254   BASOSABS 0.1 07/10/2023 1254    CMP     Component Value Date/Time   NA 139 07/10/2023 1254   NA 140 12/23/2021 1552   K 3.9 07/10/2023 1254   CL 104 07/10/2023 1254   CO2 26 07/10/2023 1254   GLUCOSE 89 07/10/2023 1254   BUN 17 07/10/2023 1254   BUN 21 12/23/2021 1552   CREATININE 1.24 07/10/2023 1254   CREATININE 1.33 (H) 02/23/2020 0933   CALCIUM  9.6 07/10/2023 1254   PROT 6.6 07/10/2023 1254   PROT 6.7 04/16/2020 0845   ALBUMIN 3.6 07/10/2023 1254   ALBUMIN 4.3 04/16/2020 0845   AST 21 07/10/2023 1254   ALT 21 07/10/2023 1254   ALKPHOS 58 07/10/2023 1254   BILITOT 0.7 07/10/2023 1254   BILITOT 0.8 04/16/2020 0845   GFRNONAA 59 (L) 07/10/2023 1254   GFRNONAA 51 (L) 02/23/2020 0933   GFRAA 59 (L) 02/23/2020 0933     Assessment/Plan:   IBS-C Has failed previous regimen of MiraLAX , Benefiber, probiotics, Linzess , Amitiza , dicyclomine , and now IBSrela.  Extensive negative workup including negative hydrogen breath test, CT scan, colonoscopy.  Also failed IBgard. Requesting combid previously given to him 50 years ago, but discussed this is no longer available in the [place] . Having 2-3 formed stools per day and currently in pelvic floor physical therapy. His biggest issue is occasional abdominal spasms and "incomplete evacuation" which he has once weekly.   Combid  was a parasympatholytic agent of prochlorperazine maleate and isopropamide iodide producing antipsychotic and anticholinergic effects. 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 Could consider ano-rectal manometry but he is already pursing pelvic floor PT. Amitriptyline  could be an option for him, but may worsen constipation and also need to consider his age. Discussed multiple options with patient and ultimately decided on below  -- Trial of Motegrity (trulance not covered) -- call to check on patient in 2 weeks - Follow-up with Dr. Bridgett Camps in 8 to 12 weeks - consider amitriptyline  (TCA) versus Cymbalta  (SNRI) at a very low dose   Delmont Prosch  [facility] 08/23/2023, 11:20 AM  Cc: Almira Jaeger, MD

## 2023-08-23 NOTE — Progress Notes (Signed)
 Addendum: Reviewed and agree with assessment and management plan. Asha Grumbine, Carie Caddy, MD

## 2023-08-25 ENCOUNTER — Ambulatory Visit (INDEPENDENT_AMBULATORY_CARE_PROVIDER_SITE_OTHER): Admitting: Family Medicine

## 2023-08-25 ENCOUNTER — Encounter: Payer: Self-pay | Admitting: Family Medicine

## 2023-08-25 VITALS — BP 108/62 | HR 83 | Temp 97.1°F | Ht 66.0 in | Wt 202.8 lb

## 2023-08-25 DIAGNOSIS — I2510 Atherosclerotic heart disease of native coronary artery without angina pectoris: Secondary | ICD-10-CM | POA: Diagnosis not present

## 2023-08-25 DIAGNOSIS — N183 Chronic kidney disease, stage 3 unspecified: Secondary | ICD-10-CM | POA: Diagnosis not present

## 2023-08-25 DIAGNOSIS — I10 Essential (primary) hypertension: Secondary | ICD-10-CM | POA: Diagnosis not present

## 2023-08-25 DIAGNOSIS — E785 Hyperlipidemia, unspecified: Secondary | ICD-10-CM | POA: Diagnosis not present

## 2023-08-25 NOTE — Patient Instructions (Addendum)
 No changes today- glad you are feeling better. You be careful on those walls!  Blood pressure looks great- glad you are doing well with half dose  Recommended follow up: Return in about 2 months (around 10/25/2023) for followup or sooner if needed.Schedule b4 you leave.

## 2023-08-25 NOTE — Progress Notes (Signed)
 Phone [phone] In person visit   Subjective:   Joshua [name] is a 83 y.o. year old very pleasant male patient who presents for/with See problem oriented charting Chief Complaint  Patient presents with   Medical Management of Chronic Issues   Hypertension   Fall    Pt c/o fall 1 months ago.    Past Medical History-  Patient Active Problem List   Diagnosis Date Noted   Scrotal pain 02/11/2023    Priority: High   Coronary atherosclerosis 10/12/2006    Priority: High   Overactive bladder 11/24/2019    Priority: Medium    Hyperglycemia 10/07/2017    Priority: Medium    Gout 11/20/2016    Priority: Medium    CKD (chronic kidney disease), stage III (HCC) 10/23/2013    Priority: Medium    Neuropathy (HCC) 08/21/2013    Priority: Medium    History of bladder cancer 04/01/2010    Priority: Medium    Essential hypertension 02/21/2007    Priority: Medium    Hyperlipidemia 10/12/2006    Priority: Medium    Asthma 10/12/2006    Priority: Medium    Aortic atherosclerosis (HCC) 05/05/2018    Priority: Low   Senile purpura (HCC) 10/07/2017    Priority: Low   DJD (degenerative joint disease)     Priority: Low   Arthritis     Priority: Low   Neck pain 02/08/2017    Priority: Low   Venous insufficiency 12/29/2016    Priority: Low   Ganglion cyst of tendon sheath of right hand 12/29/2016    Priority: Low   Obesity, Class I, BMI 30-34.9 10/03/2015    Priority: Low   Allergic rhinitis 05/31/2014    Priority: Low   Fatigue 11/09/2013    Priority: Low   Dizziness 08/23/2013    Priority: Low   Chronic bilateral low back pain with left-sided sciatica 10/09/2011    Priority: Low   Localized osteoarthrosis, lower leg 10/04/2009    Priority: Low   Tinnitus 05/05/2007    Priority: Low   OSA (obstructive sleep apnea) 11/02/2006    Priority: Low   GERD 10/12/2006    Priority: Low   De Quervain's tenosynovitis, right 10/28/2022    Priority: 1.   Degenerative arthritis  of right knee 06/08/2022    Priority: 1.   Left hamstring injury 04/21/2022    Priority: 1.   Degenerative cervical disc 01/12/2022    Priority: 1.   Greater trochanteric bursitis of right hip 02/17/2021    Priority: 1.   Primary osteoarthritis of left knee 06/18/2020    Priority: 1.   Trigger thumb, left thumb 01/24/2020    Priority: 1.   Greater trochanteric bursitis of left hip 10/27/2018    Priority: 1.   Degenerative disc disease, lumbar 10/27/2018    Priority: 1.   Abdominal muscle strain, initial encounter 06/16/2018    Priority: 1.   Weakness of left leg 06/21/2017    Priority: 1.   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     albuterol  (PROAIR  HFA) 108 (90 Base) MCG/ACT inhaler Inhale 1-2 puffs into the lungs every 6 (six) hours as needed for wheezing or shortness of breath. 18 g 5   Alirocumab  (PRALUENT ) 150 MG/ML SOAJ INJECT 1 DOSE INTO THE SKIN EVERY 14 (FOURTEEN) DAYS. 6 mL 3   amLODipine  (NORVASC ) 5 MG tablet TAKE 1 TABLET (5 MG TOTAL) BY MOUTH DAILY. 90 tablet 3   aspirin  EC 81 MG tablet Take 1 tablet (81 mg total) by mouth 2 (two) times daily after a meal. (Patient taking differently: Take 81 mg by mouth daily.) 30 tablet 11   cholecalciferol (VITAMIN D3) 25 MCG (1000 UNIT) tablet Take 1,000 Units by mouth daily.     Cyanocobalamin  (B-12) 2500 MCG TABS Take 1,250 mcg by mouth daily.     dicyclomine  (BENTYL ) 10 MG capsule Take 1  capsule (10 mg total) by mouth in the morning, at noon, and at bedtime. 90 capsule 2   docusate sodium  (COLACE) 100 MG capsule Take 100 mg by mouth 2 (two) times daily as needed for mild constipation.     EPINEPHrine  0.3 mg/0.3 mL IJ SOAJ injection Inject 0.3 mg into the muscle as needed for anaphylaxis. for allergic reaction     ezetimibe  (ZETIA ) 10 MG tablet TAKE 1 TABLET BY MOUTH EVERY DAY 90 tablet 2   fluticasone  (FLONASE ) 50 MCG/ACT nasal spray Place 1 spray into both nostrils daily.     folic acid  (FOLVITE ) 800 MCG tablet Take 800 mcg by mouth daily.      gabapentin  (NEURONTIN ) 300 MG capsule Take 2 cap in AM, 1 cap at noon, 3 caps at bedtime 540 capsule 3   Glucos-Chond-Hyal Ac-Ca Fructo (MOVE FREE JOINT HEALTH ADVANCE PO) Take 1 tablet by mouth daily.     hydrochlorothiazide  (HYDRODIURIL ) 25 MG tablet TAKE 1 TABLET BY MOUTH EVERY DAY (Patient taking differently: Take 12.5 mg by mouth daily.) 90 tablet 2   ketoconazole (NIZORAL) 2 % cream Apply 1 Application topically 2 (two) times daily as needed.     Lidocaine  HCl (ASPERCREME LIDOCAINE ) 4 % CREA Apply topically in the morning and at bedtime.     Loratadine  (CLARITIN  PO) Take by mouth at bedtime.     mirabegron  ER (MYRBETRIQ ) 50 MG TB24 tablet Take 1 tablet (50 mg total) by mouth daily. 90 tablet 1   nitroGLYCERIN  (NITROSTAT ) 0.4 MG SL tablet Place 1 tablet (0.4 mg total) under the tongue every 5 (five) minutes as needed for chest pain. 30 tablet 5   pantoprazole  (PROTONIX ) 40 MG tablet TAKE 1 TABLET BY MOUTH EVERY DAY 90 tablet 3   polyethylene glycol powder (GLYCOLAX /MIRALAX ) powder Take 17 g by mouth daily. 255 g 0   Prucalopride Succinate (MOTEGRITY) 2 MG TABS Take 1 tablet (2 mg total) by mouth daily. 30 tablet 2   valsartan  (DIOVAN ) 320 MG tablet TAKE 1 TABLET BY MOUTH EVERY DAY 90 tablet 3   diazepam  (VALIUM ) 5 MG tablet TAKE 1 TABLET (5 MG TOTAL) BY MOUTH AT BEDTIME AS NEEDED (SLEEP). (Patient not taking: Reported on 08/25/2023) 30  tablet 0   No current facility-administered medications for this visit.     Objective:  BP 108/62   Pulse 83   Temp (!) 97.1 F (36.2 C)   Ht 5\' 6"  (1.676 m)   Wt 202 lb 12.8 oz (92 kg)   SpO2 94%   BMI 32.73 kg/m  Gen: NAD, resting comfortably  CV: RRR no murmurs rubs or gallops Lungs: CTAB no crackles, wheeze, rhonchi Ext: no edema Skin: warm, dry     Assessment and Plan   #social update- older brother worried about his memory  # Fall S: Had a fall about a month ago after our recent visit- went to [place] and had to park far away - tried to get over a wall and fell down- hit his elbow and has some lingering pain on the left and physical therapy said still swollen on left side.   -We had treated him for bacterial sinusitis around that time with Augmentin  and have considered prednisone  but he does not sleep well on that medication but eventually did need this.  We did extend his Augmentin  at follow-up visit on 07/27/2023 - We also referred back to allergist after losing his allergist Dr. Phill Brazil - At that point had nearly 2 months of cough but was improving so we opted to hold off on x-ray, of note was already on pantoprazole  for reflux  -mild lingering throat congestion noted. Still on reflux medicine. Has appointment with allergist later this month as well  A/P: thankfully only injury appears to be elbow- looks like a mild bursitis- should continue to go down if able to avoid recurrent trauma but ends up hitting it sometimes- continue current medications    Mild throat congestion- upcoming allergist visit. Exam benign- monitor   # IBS C follow-up-just had visit 2 days ago-they mention feeling prior regimens of MiraLAX , Benefiber, probiotics, Linzess , Amitiza , dicyclomine  and IBSrela, IBgard.  They also mention significant prior workup including hydrogen breath test, CT scan, colonoscopy. -Has been elevated 2-3 times a day and is in pelvic PT but complains still of  abdominal spasms and incomplete evacuation about once a week.  There is a prior medication he had tried which was beneficial but no longer available.  They mentioned considering anorectal manometry.  They considered amitriptyline  but concerned with his age and potential for constipation -Plan was to trial motegrity and check in with patient in 2 weeks and see Dr. Bridgett Camps in 8 to 12 weeks- plans to try this- hasn't picked up yet - They also mention possible Cymbalta   # Dyslipidemia-also had a visit with Dr. Maximo Spar on 08/18/2023 and reports doing very well on Praluent -plan is for him to continue with primary cardiologist only with help of pharmacy to ask for medication-as needed follow-up only.  Also to work on lifestyle- As far as weight loss has been able to maintain weight closer to 200-congratulated his efforts-very challenging with what he is able to eat and pains which limit exercise. Trying to push his walks a little longer such as if he parks somewhere but back bothers her.  Trying some push ups as tolerable.    #CAD/hyperlipidemia-history of CABG x5- follows with Dr. Lavonne Prairie S: Meds: asa 81mg , praluent  -Fatigue on Repatha  in 2021-restarted on Zetia  10 mg p.o. as well but later placed back on praluent  150 mg q2 weeks -Stent drug eluting placed 04/13/19 Dr. Abel Hoe -no chest pain or increased shortness of breath  Lab Results  Component Value Date   CHOL 117 08/12/2023   HDL 45 08/12/2023   LDLCALC 45 08/12/2023   LDLDIRECT 115.0 06/04/2016   TRIG 161 (H) 08/12/2023   CHOLHDL 2.6 08/12/2023  A/P: coronary artery disease asymptomatic  Lipids at goal just 2 weeks ago on praluent . 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 S:  Medication: Buspirone  5 mg twice daily as needed-uses very sparingly- felt maybe not as clear so holding off  -Has declined counseling and continues to decline counseling-has done this in the past and only somewhat helpful but had to find the right fit    08/25/2023    9:21 AM 07/01/2023   11:07 AM 06/10/2023   10:18 AM  Depression screen PHQ 2/9  Decreased Interest 0 0 0  Down, Depressed, Hopeless 1 0 1  PHQ - 2 Score 1 0 1  Altered sleeping 0 0   Tired, decreased energy  0   Change in appetite 1 0   Feeling bad or failure about yourself  1 0   Trouble concentrating 2 0   Moving slowly or fidgety/restless 0 0   Suicidal thoughts 0 0   PHQ-9 Score 5 0   Difficult doing work/chores Not difficult at all Not difficult at all   A/P: has felt a little more down lately- feels aging is hard on him.    #dealing with knee pain- not interested in surgery at this point  Recommended follow up: Return in about 2 months (around 10/25/2023) for followup or sooner if needed.Schedule b4 you leave. Future Appointments  Date Time Provider Department Center  08/26/2023  8:45 AM Isidro Margo, DO [facility] None  09/02/2023  8:30 AM Rochester Chuck, MD [facility] None  10/18/2023  9:00 AM Antonio Baumgarten, NP [facility] None  12/06/2023  9:15 AM Carie Charity, NP [facility] H[facility]V  04/27/2024 10:00 AM Jhonny Moss, MD [facility] None  06/15/2024 10:00 AM [facility] ANNUAL WELLNESS VISIT 1 [facility]    Lab/Order associations:   ICD-10-CM   1. Atherosclerosis of native coronary artery of native heart without angina pectoris  I25.10     2. Essential hypertension  I10     3. Hyperlipidemia, unspecified hyperlipidemia type  E78.5     4. Stage 3 chronic kidney disease, unspecified whether stage 3a or 3b CKD (HCC)  N18.30       No orders of  the defined types were placed in this encounter.   Return precautions advised.  Clarisa Crooked, MD

## 2023-08-26 ENCOUNTER — Ambulatory Visit: Admitting: Family Medicine

## 2023-08-26 VITALS — BP 110/74 | HR 65 | Ht 66.0 in | Wt 204.0 lb

## 2023-08-26 DIAGNOSIS — G629 Polyneuropathy, unspecified: Secondary | ICD-10-CM | POA: Diagnosis not present

## 2023-08-26 MED ORDER — LEVOTHYROXINE SODIUM 25 MCG PO TABS: 25.0000 ug | ORAL_TABLET | Freq: Every day | ORAL | 1 refills | Status: DC

## 2023-08-26 NOTE — Assessment & Plan Note (Signed)
 Significant difficulties still occurring.  Unable to get the peripheral sodium channel blocker secondary to cost concern to start extremity medication but we did discuss the possibility of Synthroid at a very low dose that sometimes can help amplify the results of gabapentin  follow-up with me again in 2 months to see how patient is responding.

## 2023-08-26 NOTE — Patient Instructions (Addendum)
 Great to see you  Synthroid 25 mcg daily to see if it helps Continue other meds Continue physical therapy  Ask urologist about the thickening of the bladder on the MRI of the pelvis and if it matters  See me again in 8 weeks or so

## 2023-08-27 ENCOUNTER — Telehealth: Payer: Self-pay | Admitting: Gastroenterology

## 2023-08-27 ENCOUNTER — Other Ambulatory Visit: Payer: Self-pay | Admitting: Family Medicine

## 2023-08-27 NOTE — Telephone Encounter (Signed)
 Patient called and stated that he was prescribed a new medication by [facility] called Prucalopride Succinate 2 MG. Patient stated that ever since he started taking this medication he has been having severe abdominal pain, "sort of diarrhea" (patient words) and a rash near his genital area. Patient is requesting a call back. Please advise.

## 2023-08-27 NOTE — Telephone Encounter (Signed)
 Patient calls stating that he has taken motegrity x 3 nights and has not tolerated it (though a note from sports med from 6/4 indicates patient had not yet purchased the medication).  Patient states that he has been experiencing upper abdominal pain wrapping around to his back over the last couple of days. States that this pain awakened him last night. Also states that he is having "almost diarrhea" and substantial increase in gas. Denies any blood in stool. +nausea but no vomiting.  Patient describes a rash that came up on his leg last night and goes up into the groin area. No other areas of rash or swelling. He does say that this rash itches. I discussed that likely, the rash is unrelated to the medication as a medication reaction normally causes systemic rashes. Patient says he does have an appointment with dermatology for some other issues and will keep that appointment.   We discussed that motegrity can take a couple of weeks for the body to adjust to. Patient says he is not taking any more of this medication and is "not constipated."

## 2023-08-27 NOTE — Telephone Encounter (Signed)
 I have spoken with patient and gave recommendations as per [facility]. Patient will take fiber and use squatty potty at this time. Does not wish to take laxative medication.

## 2023-08-31 ENCOUNTER — Other Ambulatory Visit: Payer: Self-pay | Admitting: Gastroenterology

## 2023-08-31 DIAGNOSIS — K581 Irritable bowel syndrome with constipation: Secondary | ICD-10-CM

## 2023-08-31 DIAGNOSIS — R1084 Generalized abdominal pain: Secondary | ICD-10-CM

## 2023-09-02 ENCOUNTER — Ambulatory Visit: Admitting: Allergy & Immunology

## 2023-09-02 ENCOUNTER — Encounter: Payer: Self-pay | Admitting: Allergy & Immunology

## 2023-09-02 ENCOUNTER — Other Ambulatory Visit: Payer: Self-pay

## 2023-09-02 VITALS — BP 110/60 | HR 48 | Temp 97.7°F | Resp 18 | Ht 65.75 in | Wt 202.7 lb

## 2023-09-02 DIAGNOSIS — J310 Chronic rhinitis: Secondary | ICD-10-CM | POA: Diagnosis not present

## 2023-09-02 DIAGNOSIS — J4520 Mild intermittent asthma, uncomplicated: Secondary | ICD-10-CM | POA: Diagnosis not present

## 2023-09-02 NOTE — Patient Instructions (Addendum)
 1. Mild intermittent asthma, uncomplicated - Lung testing looked stellar today. - I do not think that we need a daily controller medication at this time. - We can refill your albuterol  if you need a refill.   2. Chronic rhinitis - Because of insurance stipulations, we cannot do skin testing on the same day as your first visit. - We are all working to fight this, but for now we need to do two separate visits.  - We are getting some blood work to look for allergies and then we will follow that up with skin testing if needed.  - We will know more after we do testing at the next visit.  - The skin testing visit can be squeezed in at your convenience.  - Then we can make a more full plan to address all of your symptoms. - Be sure to stop your antihistamines for 3 days before this appointment.  - We will get a release of information form together so we can see what Dr. Valera Gaster did.   3. Return in about 1 week (around 09/09/2023) for SKIN TESTING (INTRADERMAL). You can have the follow up appointment with Dr. Idolina Maker or a Nurse Practicioner (our Nurse Practitioners are excellent and always have Physician oversight!).    Please inform us  of any Emergency Department visits, hospitalizations, or changes in symptoms. Call us  before going to the ED for breathing or allergy symptoms since we might be able to fit you in for a sick visit. Feel free to contact us  anytime with any questions, problems, or concerns.  It was a pleasure to meet you today!  Websites that have reliable patient information: 1. American Academy of Asthma, Allergy, and Immunology: www.aaaai.org 2. Food Allergy Research and Education (FARE): foodallergy.org 3. Mothers of Asthmatics: http://www.asthmacommunitynetwork.org 4. American College of Allergy, Asthma, and Immunology: www.acaai.org      "Like" us  on Facebook and Instagram for our latest updates!      A healthy democracy works best when [employer] participate! Make  sure you are registered to vote! If you have moved or changed any of your contact information, you will need to get this updated before voting! Scan the QR codes below to learn more!

## 2023-09-02 NOTE — Progress Notes (Signed)
 NEW PATIENT  Date of Service/Encounter:  09/02/23  Consult requested by: Almira Jaeger, MD   Assessment:   Mild intermittent asthma, uncomplicated  Chronic rhinitis - planning for skin testing at the next visit  Singulair  non-responder  Coronary artery disease - s/p stent and bypass in 2001  History of prostate and bladder cancer  Plan/Recommendations:   1. Mild intermittent asthma, uncomplicated - Lung testing looked stellar today. - I do not think that we need a daily controller medication at this time. - We can refill your albuterol  if you need a refill.   2. Chronic rhinitis - Because of insurance stipulations, we cannot do skin testing on the same day as your first visit. - We are all working to fight this, but for now we need to do two separate visits.  - We are getting some blood work to look for allergies and then we will follow that up with skin testing if needed.  - We will know more after we do testing at the next visit.  - The skin testing visit can be squeezed in at your convenience.  - Then we can make a more full plan to address all of your symptoms. - Be sure to stop your antihistamines for 3 days before this appointment.  - We will get a release of information form together so we can see what Dr. Valera Gaster did.   3. Return in about 1 week (around 09/09/2023) for SKIN TESTING (INTRADERMAL). You can have the follow up appointment with Dr. Idolina Maker or a Nurse Practicioner (our Nurse Practitioners are excellent and always have Physician oversight!).   This note in its entirety was forwarded to the Provider who requested this consultation.  Subjective:   Joshua [name] is a 83 y.o. male presenting today for evaluation of  Chief Complaint  Patient presents with   Establish Care    Pt reports that he had stopped allergy shots, inhalers about 1 to 2 years. PCP recommend for him to come see us  to est care.     Joshua [name] has a history of the  following: Patient Active Problem List   Diagnosis Date Noted   Scrotal pain 02/11/2023   De Quervain's tenosynovitis, right 10/28/2022   Calcium  oxalate crystals in urine 07/30/2022   Balanitis 07/06/2022   Urinary frequency 07/06/2022   Degenerative arthritis of right knee 06/08/2022   Left hamstring injury 04/21/2022   Degenerative cervical disc 01/12/2022   Greater trochanteric bursitis of right hip 02/17/2021   Hyperactive bowel sounds 01/29/2021   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) 09/25/2020   Primary osteoarthritis of left knee 06/18/2020   Trigger thumb, left thumb 01/24/2020   Overactive bladder 11/24/2019   Left lateral epicondylitis 09/05/2019   Greater trochanteric bursitis of left hip 10/27/2018   Degenerative disc disease, lumbar 10/27/2018   Abdominal muscle strain, initial encounter 06/16/2018   Aortic atherosclerosis (HCC) 05/05/2018   Senile purpura (HCC) 10/07/2017   Hyperglycemia 10/07/2017   Weakness of left leg 06/21/2017   DJD (degenerative joint disease)    Arthritis    Neck pain 02/08/2017   Venous insufficiency 12/29/2016   Ganglion cyst of tendon sheath of right hand 12/29/2016   Gout 11/20/2016   Obesity, Class I, BMI 30-34.9 10/03/2015   Hereditary and idiopathic peripheral neuropathy 09/25/2014   Allergic rhinitis 05/31/2014   Fatigue 11/09/2013   CKD (chronic kidney disease), stage III (HCC) 10/23/2013   Dizziness 08/23/2013  Neuropathy (HCC) 08/21/2013   Chronic bilateral low back pain with left-sided sciatica 10/09/2011   History of bladder cancer 04/01/2010   Localized osteoarthrosis, lower leg 10/04/2009   Generalized abdominal pain 01/31/2009   Tinnitus 05/05/2007   Essential hypertension 02/21/2007   OSA (obstructive sleep apnea) 11/02/2006   Hyperlipidemia 10/12/2006   Coronary atherosclerosis 10/12/2006   Asthma 10/12/2006   GERD 10/12/2006    History obtained from: chart review and  patient.  Discussed the use of AI scribe software for clinical note transcription with the patient and/or guardian, who gave verbal consent to proceed.  Joshua [name] was referred by Almira Jaeger, MD.     Joshua [name] is a 83 y.o. male presenting for an evaluation of allergies and asthma.   Asthma/Respiratory Symptom History: His asthma is well-controlled, and he does not require regular medication, including albuterol . He previously received allergy shots but no longer finds them necessary. He uses Flonase  for nasal symptoms and takes loratadine  nightly for allergies. No sneezing or significant nasal drainage currently, though he experienced nasal congestion and frequent nose blowing for about eight weeks.  He smoked for 35 years but quit prior to his heart surgery. He takes aspirin  81 mg daily and Valsartan  for his heart condition. He has a history of being allergic to many medications and has tried various antihistamines, finding loratadine  to be the most effective.   Allergic Rhinitis Symptom History: He has been experiencing fatigue and head pressure for approximately eleven weeks. The head pressure is described as a sensation of 'somebody's squeezing it.' Alongside these symptoms, he has a persistent dry cough, which he describes as feeling like 'something stuck in my throat.'  GERD Symptom History: He has a history of gastroesophageal reflux disease (GERD) for which he takes pantoprazole . He has been on this medication for many years since his heart surgery in 2001. No heartburn but acknowledges possible reflux.  His past medical history includes multiple surgeries, such as five heart bypasses in 2001, prostate cancer, bladder cancer, and knee surgery. He experienced significant pain post-prostate surgery, which was managed with the help of a therapist. He also had knee surgery, which resulted in complications and pain.  Otherwise, there is no history of other atopic diseases, including  asthma, environmental allergies, stinging insect allergies, or contact dermatitis. There is no significant infectious history. Vaccinations are up to date.    Past Medical History: Patient Active Problem List   Diagnosis Date Noted   Scrotal pain 02/11/2023   De Quervain's tenosynovitis, right 10/28/2022   Calcium  oxalate crystals in urine 07/30/2022   Balanitis 07/06/2022   Urinary frequency 07/06/2022   Degenerative arthritis of right knee 06/08/2022   Left hamstring injury 04/21/2022   Degenerative cervical disc 01/12/2022   Greater trochanteric bursitis of right hip 02/17/2021   Hyperactive bowel sounds 01/29/2021   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) 09/25/2020   Primary osteoarthritis of left knee 06/18/2020   Trigger thumb, left thumb 01/24/2020   Overactive bladder 11/24/2019   Left lateral epicondylitis 09/05/2019   Greater trochanteric bursitis of left hip 10/27/2018   Degenerative disc disease, lumbar 10/27/2018   Abdominal muscle strain, initial encounter 06/16/2018   Aortic atherosclerosis (HCC) 05/05/2018   Senile purpura (HCC) 10/07/2017   Hyperglycemia 10/07/2017   Weakness of left leg 06/21/2017   DJD (degenerative joint disease)    Arthritis    Neck pain 02/08/2017   Venous insufficiency 12/29/2016   Ganglion cyst of  tendon sheath of right hand 12/29/2016   Gout 11/20/2016   Obesity, Class I, BMI 30-34.9 10/03/2015   Hereditary and idiopathic peripheral neuropathy 09/25/2014   Allergic rhinitis 05/31/2014   Fatigue 11/09/2013   CKD (chronic kidney disease), stage III (HCC) 10/23/2013   Dizziness 08/23/2013   Neuropathy (HCC) 08/21/2013   Chronic bilateral low back pain with left-sided sciatica 10/09/2011   History of bladder cancer 04/01/2010   Localized osteoarthrosis, lower leg 10/04/2009   Generalized abdominal pain 01/31/2009   Tinnitus 05/05/2007   Essential hypertension 02/21/2007   OSA (obstructive sleep apnea)  11/02/2006   Hyperlipidemia 10/12/2006   Coronary atherosclerosis 10/12/2006   Asthma 10/12/2006   GERD 10/12/2006    Medication List:  Allergies as of 09/02/2023       Reactions   Codeine Other (See Comments)   Trouble breathing   Lexapro  [escitalopram ]    Dizziness   Bromfed Nausea And Vomiting   Clarithromycin Other (See Comments)    gastritis   Cymbalta  [duloxetine  Hcl]    Abdominal pain and later chest pain as well as worsening fatigue    Doxycycline Hyclate Nausea And Vomiting   Modafinil Other (See Comments)    anxiety-nervousness   Oxycodone -acetaminophen  Itching   Promethazine Hcl Other (See Comments)    fainting   Quinolones Nausea Only   Cipro  felt real bad   Telithromycin Nausea And Vomiting   Tramadol  Nausea Only   Benadryl [diphenhydramine] Other (See Comments)   Pt told not to take because of bypass surgery   Hydrocodone -acetaminophen  Rash   Repatha  [evolocumab ]    Weak, worn out   Statins Other (See Comments)   Leg cramps and makes patient feel bad. Per pt tried multiple in years past   Sulfonamide Derivatives Rash        Medication List        Accurate as of September 02, 2023 10:15 AM. If you have any questions, ask your nurse or doctor.          acetaminophen  500 MG tablet Commonly known as: TYLENOL  Take 1,000 mg by mouth every 6 (six) hours as needed for moderate pain or headache.   albuterol  108 (90 Base) MCG/ACT inhaler Commonly known as: ProAir  HFA Inhale 1-2 puffs into the lungs every 6 (six) hours as needed for wheezing or shortness of breath.   amLODipine  5 MG tablet Commonly known as: NORVASC  TAKE 1 TABLET (5 MG TOTAL) BY MOUTH DAILY.   Aspercreme Lidocaine  4 % Crea Generic drug: Lidocaine  HCl Apply topically in the morning and at bedtime.   aspirin  EC 81 MG tablet Take 1 tablet (81 mg total) by mouth 2 (two) times daily after a meal. What changed: when to take this   B-12 2500 MCG Tabs Take 1,250 mcg by mouth daily.    cholecalciferol 25 MCG (1000 UNIT) tablet Commonly known as: VITAMIN D3 Take 1,000 Units by mouth daily.   CLARITIN  PO Take by mouth at bedtime.   diazepam  5 MG tablet Commonly known as: VALIUM  TAKE 1 TABLET (5 MG TOTAL) BY MOUTH AT BEDTIME AS NEEDED (SLEEP).   dicyclomine  10 MG capsule Commonly known as: BENTYL  TAKE 1 CAPSULE (10 MG TOTAL) BY MOUTH IN THE MORNING, AT NOON, AND AT BEDTIME.   docusate sodium  100 MG capsule Commonly known as: COLACE Take 100 mg by mouth 2 (two) times daily as needed for mild constipation.   EPINEPHrine  0.3 mg/0.3 mL Soaj injection Commonly known as: EPI-PEN Inject 0.3 mg into the muscle  as needed for anaphylaxis. for allergic reaction   ezetimibe  10 MG tablet Commonly known as: ZETIA  TAKE 1 TABLET BY MOUTH EVERY DAY   fluticasone  50 MCG/ACT nasal spray Commonly known as: FLONASE  Place 1 spray into both nostrils daily.   folic acid  800 MCG tablet Commonly known as: FOLVITE  Take 800 mcg by mouth daily.   gabapentin  300 MG capsule Commonly known as: NEURONTIN  Take 2 cap in AM, 1 cap at noon, 3 caps at bedtime   hydrochlorothiazide  25 MG tablet Commonly known as: HYDRODIURIL  TAKE 1 TABLET BY MOUTH EVERY DAY What changed: how much to take   ketoconazole 2 % cream Commonly known as: NIZORAL Apply 1 Application topically 2 (two) times daily as needed.   levothyroxine  25 MCG tablet Commonly known as: Synthroid  Take 1 tablet (25 mcg total) by mouth daily before breakfast.   MOVE FREE JOINT HEALTH ADVANCE PO Take 1 tablet by mouth daily.   Myrbetriq  50 MG Tb24 tablet Generic drug: mirabegron  ER TAKE 1 TABLET BY MOUTH EVERY DAY (REPLACES GEMTESA )   nitroGLYCERIN  0.4 MG SL tablet Commonly known as: NITROSTAT  Place 1 tablet (0.4 mg total) under the tongue every 5 (five) minutes as needed for chest pain.   pantoprazole  40 MG tablet Commonly known as: PROTONIX  TAKE 1 TABLET BY MOUTH EVERY DAY   polyethylene glycol powder 17  GM/SCOOP powder Commonly known as: GLYCOLAX /MIRALAX  Take 17 g by mouth daily.   Praluent  150 MG/ML Soaj Generic drug: Alirocumab  INJECT 1 DOSE INTO THE SKIN EVERY 14 (FOURTEEN) DAYS.   Prucalopride Succinate  2 MG Tabs Commonly known as: Motegrity  Take 1 tablet (2 mg total) by mouth daily.   valsartan  320 MG tablet Commonly known as: DIOVAN  TAKE 1 TABLET BY MOUTH EVERY DAY        Birth History: non-contributory  Developmental History: non-contributory  Past Surgical History: Past Surgical History:  Procedure Laterality Date   ANTERIOR CERVICAL DECOMP/DISCECTOMY FUSION  05/26/2006   PLATE PLACED   BACK SURGERY     CARDIAC CATHETERIZATION  11/20/1999   CORONARY ARTERY BYPASS GRAFT  11/20/1999   SVG-RI1-RI2, SVG-OM, SVG-dRCA   CORONARY STENT INTERVENTION N/A 04/13/2019   Procedure: CORONARY STENT INTERVENTION;  Surgeon: Odie Benne, MD;  Location: [facility];  Service: Cardiovascular;  Laterality: N/A;   HERNIA REPAIR     INGUINAL HERNIA REPAIR Left 01/02/2019   Procedure: OPEN LEFT INGUINAL HERNIA REPAIR WITH MESH;  Surgeon: Jacolyn Matar, MD;  Location: [facility];  Service: General;  Laterality: Left;   IR EMBO ARTERIAL NOT HEMORR HEMANG INC GUIDE ROADMAPPING  03/08/2023   IR RADIOLOGIST EVAL & MGMT  02/05/2023   IR RADIOLOGIST EVAL & MGMT  04/05/2023   IR RADIOLOGIST EVAL & MGMT  04/09/2023   IR RADIOLOGIST EVAL & MGMT  04/30/2023   KNEE ARTHROSCOPY Right 1992; 04/2008   LAPAROSCOPIC CHOLECYSTECTOMY  2007   LEFT HEART CATH AND CORS/GRAFTS ANGIOGRAPHY N/A 04/13/2019   Procedure: LEFT HEART CATH AND CORS/GRAFTS ANGIOGRAPHY;  Surgeon: Odie Benne, MD;  Location: [facility];  Service: Cardiovascular;  Laterality: N/A;   NASAL SEPTUM SURGERY Right 2007   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   ROBOT ASSISTED LAPAROSCOPIC RADICAL PROSTATECTOMY  04/2010   THYROIDECTOMY, PARTIAL  1980   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   Procedure: LEFT  TOTAL KNEE ARTHROPLASTY;  Surgeon: Dayne Even, MD;  Location: [facility];  Service: Orthopedics;  Laterality: Left;     Family History: Family History  Problem  Relation Age of Onset   Heart disease Mother    Hyperlipidemia Mother    Heart disease Father    Hyperlipidemia Father    Diabetes Brother    Colon cancer Neg Hx    Pancreatic cancer Neg Hx    Esophageal cancer Neg Hx    Stomach cancer Neg Hx    Liver cancer Neg Hx      Social History: Cinsere lives at home with his family.  They live in a townhome that is 81 years old.  There is carpeting throughout the home.  They have gas heating and central cooling.  There are 2 cats inside of the home.  There are no dust mite covers on the pillows, but they do have dust mite covers on the bed.  There is no tobacco exposure.  There is no fume, chemical, or dust exposure.  There is a HEPA filter in the home.  He does not [place] an interstate or industrial area. He is formerly retired in 2008. 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    Review of systems otherwise negative other than that mentioned in the HPI.    Objective:   Blood pressure 110/60, pulse (!) 48, temperature 97.7 F (36.5 C), temperature source Temporal, resp. rate 18, height 5' 5.75 (1.67 m), weight 202 lb 11.2 oz (91.9 kg), SpO2 96%. Body mass index is 32.97 kg/m.     Physical Exam Vitals reviewed.  Constitutional:      Appearance: He is well-developed.     Comments: Very kind, caring individual.   HENT:     Head: Normocephalic and atraumatic.     Right Ear: Tympanic membrane, ear canal and external ear normal. No drainage, swelling or tenderness. Tympanic membrane is not injected, scarred, erythematous, retracted or bulging.     Left Ear: Tympanic membrane, ear canal and external ear normal. No drainage, swelling or tenderness. Tympanic membrane is not injected, scarred,  erythematous, retracted or bulging.     Nose: No nasal deformity, septal deviation, mucosal edema or rhinorrhea.     Right Turbinates: Enlarged, swollen and pale.     Left Turbinates: Enlarged, swollen and pale.     Right Sinus: No maxillary sinus tenderness or frontal sinus tenderness.     Left Sinus: No maxillary sinus tenderness or frontal sinus tenderness.     Mouth/Throat:     Mouth: Mucous membranes are not pale and not dry.     Pharynx: Uvula midline.   Eyes:     General:        Right eye: No discharge.        Left eye: No discharge.     Conjunctiva/sclera: Conjunctivae normal.     Right eye: Right conjunctiva is not injected. No chemosis.    Left eye: Left conjunctiva is not injected. No chemosis.    Pupils: Pupils are equal, round, and reactive to light.    Cardiovascular:     Rate and Rhythm: Normal rate and regular rhythm.     Heart sounds: Normal heart sounds.  Pulmonary:     Effort: Pulmonary effort is normal. No tachypnea, accessory muscle usage or respiratory distress.     Breath sounds: Normal breath sounds. No wheezing, rhonchi or rales.  Chest:     Chest wall: No tenderness.  Abdominal:     Tenderness: There is no abdominal tenderness. There is no guarding or rebound.  Lymphadenopathy:     Head:     Right side of head: No submandibular, tonsillar or occipital adenopathy.     Left side of head: No submandibular, tonsillar or occipital adenopathy.     Cervical: No cervical adenopathy.   Skin:    Coloration: Skin is not pale.     Findings: No abrasion, erythema, petechiae or rash. Rash is not papular, urticarial or vesicular.   Neurological:     Mental Status: He is alert.   Psychiatric:        Behavior: Behavior is cooperative.      Diagnostic studies:    Spirometry: results normal (FEV1: 2.04/78%, FVC: 3.13/90%, FEV1/FVC: 65%).    Spirometry consistent with normal pattern.    Allergy Studies: deferred due to insurance stipulations that require a  separate visit for testing          Drexel Gentles, MD [facility] 

## 2023-09-07 LAB — ALLERGENS W/COMP RFLX AREA 2

## 2023-09-08 DIAGNOSIS — D225 Melanocytic nevi of trunk: Secondary | ICD-10-CM | POA: Diagnosis not present

## 2023-09-08 DIAGNOSIS — D692 Other nonthrombocytopenic purpura: Secondary | ICD-10-CM | POA: Diagnosis not present

## 2023-09-08 DIAGNOSIS — L821 Other seborrheic keratosis: Secondary | ICD-10-CM | POA: Diagnosis not present

## 2023-09-08 DIAGNOSIS — L304 Erythema intertrigo: Secondary | ICD-10-CM | POA: Diagnosis not present

## 2023-09-08 DIAGNOSIS — L570 Actinic keratosis: Secondary | ICD-10-CM | POA: Diagnosis not present

## 2023-09-09 LAB — ALLERGENS W/COMP RFLX AREA 2
Bermuda Grass IgE: 0.21 kU/L — AB
Bermuda Grass IgE: <0.1 kU/L
Cedar, Mountain IgE: <0.1 kU/L
Cladosporium Herbarum IgE: <0.1 kU/L
Cottonwood IgE: 0.15 kU/L — AB
D Farinae IgE: 0.19 kU/L — AB
D Pteronyssinus IgE: 0.19 kU/L — AB
E001-IgE Cat Dander: 61 kU/L — AB
E005-IgE Dog Dander: 2.08 kU/L — AB
E101-IgE Can f 1: 0.13 kU/L — AB
E226-IgE Can f 5: 0.19 kU/L — AB
Elm, American IgE: 0.16 kU/L — AB
IgE (Immunoglobulin E), Serum: 2728 [IU]/mL — ABNORMAL HIGH (ref 6–495)
Johnson Grass IgE: 0.18 kU/L — AB
Johnson Grass IgE: <0.1 kU/L
Maple/Box Elder IgE: <0.1 kU/L
Mouse Urine IgE: <0.1 kU/L
Pecan, Hickory IgE: <0.1 kU/L
Penicillium Chrysogen IgE: <0.1 kU/L
Penicillium Chrysogen IgE: <0.1 kU/L
Pigweed, Rough IgE: <0.1 kU/L
Ragweed, Short IgE: 0.11 kU/L — AB
Sheep Sorrel IgE Qn: <0.1 kU/L
Timothy Grass IgE: <0.1 kU/L
White Mulberry IgE: 0.11 kU/L — AB

## 2023-09-09 LAB — ALLERGEN COMPONENT COMMENTS

## 2023-09-09 LAB — PANEL 606648
E101-IgE Can f 1: <0.1 kU/L
E102-IgE Can f 2: <0.1 kU/L
E221-IgE Can f 3: <0.1 kU/L
E226-IgE Can f 5: <0.1 kU/L

## 2023-09-09 LAB — PANEL 606578
E094-IgE Fel d 1: 36.7 kU/L — AB
E220-IgE Fel d 2: <0.1 kU/L
E228-IgE Fel d 4: 0.22 kU/L — AB

## 2023-09-13 DIAGNOSIS — N3941 Urge incontinence: Secondary | ICD-10-CM | POA: Diagnosis not present

## 2023-09-13 DIAGNOSIS — M62838 Other muscle spasm: Secondary | ICD-10-CM | POA: Diagnosis not present

## 2023-09-13 DIAGNOSIS — M6281 Muscle weakness (generalized): Secondary | ICD-10-CM | POA: Diagnosis not present

## 2023-09-13 DIAGNOSIS — R102 Pelvic and perineal pain: Secondary | ICD-10-CM | POA: Diagnosis not present

## 2023-09-13 DIAGNOSIS — K5900 Constipation, unspecified: Secondary | ICD-10-CM | POA: Diagnosis not present

## 2023-09-14 ENCOUNTER — Ambulatory Visit: Admitting: Allergy & Immunology

## 2023-09-14 ENCOUNTER — Encounter: Payer: Self-pay | Admitting: Allergy & Immunology

## 2023-09-14 DIAGNOSIS — J4520 Mild intermittent asthma, uncomplicated: Secondary | ICD-10-CM

## 2023-09-14 DIAGNOSIS — J3089 Other allergic rhinitis: Secondary | ICD-10-CM | POA: Diagnosis not present

## 2023-09-14 DIAGNOSIS — J302 Other seasonal allergic rhinitis: Secondary | ICD-10-CM | POA: Diagnosis not present

## 2023-09-14 MED ORDER — CARBINOXAMINE MALEATE 4 MG PO TABS: 4.0000 mg | ORAL_TABLET | Freq: Two times a day (BID) | ORAL | 3 refills | Status: DC

## 2023-09-14 MED ORDER — IPRATROPIUM BROMIDE 0.03 % NA SOLN: 2.0000 | Freq: Three times a day (TID) | NASAL | 12 refills | Status: DC | PRN

## 2023-09-14 NOTE — Progress Notes (Signed)
 FOLLOW UP  Date of Service/Encounter:  09/14/23   Assessment:   Mild intermittent asthma, uncomplicated   Perennial and seasonal allergic rhinitis (grasses, ragweed, trees, indoor molds, outdoor molds, dust mites, cat, dog, and cockroach)  Singulair  non-responder   Coronary artery disease - s/p stent and bypass in 2001   History of prostate and bladder cancer  Plan/Recommendations:   1. Mild intermittent asthma, uncomplicated - Lung testing looked stellar at the last visit.  - I do not think that we need a daily controller medication at this time. - We can refill your albuterol  if you need a refill.   2. Chronic rhinitis - Testing today showed: grasses, ragweed, trees, indoor molds, outdoor molds, dust mites, cat, dog, and cockroach - Copy of test results provided.  - Avoidance measures provided. - Stop taking: Claritin  and Flonase  - Start taking: Carbinoxamine 4mg  tablet TWICE DAILY and Atrovent  (ipratropium) 0.03% one spray per nostril 2-3 times daily as needed (CAN BE OVER DRYING) - You can use an extra dose of the antihistamine, if needed, for breakthrough symptoms.  - Consider nasal saline rinses 1-2 times daily to remove allergens from the nasal cavities as well as help with mucous clearance (this is especially helpful to do before the nasal sprays are given) - Consider allergy shots as a means of long-term control. - Allergy shots re-train and reset the immune system to ignore environmental allergens and decrease the resulting immune response to those allergens (sneezing, itchy watery eyes, runny nose, nasal congestion, etc).    - Allergy shots improve symptoms in 75-85% of patients.  - We can discuss more at the next appointment if the medications are not working for you. - These can CURE allergies, but they are a time commitment.   3. Return in about 2 months (around 11/14/2023). You can have the follow up appointment with Dr. Iva or a Nurse Practicioner  (our Nurse Practitioners are excellent and always have Physician oversight!).    Subjective:   Joshua [name] is a 83 y.o. male presenting today for follow up of No chief complaint on file.   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) 09/25/2020   Primary osteoarthritis of left knee 06/18/2020   Trigger thumb, left thumb 01/24/2020   Overactive bladder 11/24/2019   Left lateral epicondylitis 09/05/2019   Greater trochanteric bursitis of left hip 10/27/2018   Degenerative disc disease, lumbar 10/27/2018   Abdominal muscle strain, initial encounter 06/16/2018   Aortic atherosclerosis (HCC) 05/05/2018   Senile purpura (HCC) 10/07/2017   Hyperglycemia 10/07/2017   Weakness of left leg 06/21/2017   DJD (degenerative joint disease)    Arthritis    Neck pain 02/08/2017   Venous insufficiency 12/29/2016   Ganglion cyst of tendon sheath of right hand 12/29/2016   Gout 11/20/2016   Obesity, Class I, BMI 30-34.9 10/03/2015   Hereditary and idiopathic peripheral neuropathy 09/25/2014   Allergic rhinitis 05/31/2014   Fatigue 11/09/2013   CKD (chronic kidney  disease), stage III (HCC) 10/23/2013   Dizziness 08/23/2013   Neuropathy (HCC) 08/21/2013   Chronic bilateral low back pain with left-sided sciatica 10/09/2011   History of bladder cancer 04/01/2010   Localized osteoarthrosis, lower leg 10/04/2009   Generalized abdominal pain 01/31/2009   Tinnitus 05/05/2007    Essential hypertension 02/21/2007   OSA (obstructive sleep apnea) 11/02/2006   Hyperlipidemia 10/12/2006   Coronary atherosclerosis 10/12/2006   Asthma 10/12/2006   GERD 10/12/2006    History obtained from: chart review and patient.  Discussed the use of AI scribe software for clinical note transcription with the patient and/or guardian, who gave verbal consent to proceed.  Joshua [name] is a 83 y.o. male presenting for skin testing. He was last seen on June 12. We could not do testing because his insurance company does not cover testing on the same day as a New Patient visit. He has been off of all antihistamines 3 days in anticipation of the testing.   He was last seen in June 2025.  At that time, we did like testing which looked excellent.  We did not think that a daily controller medication was needed.  For his rhinitis, we did environmental allergy testing via the blood which did come back positive for a fair number of items.  Component     Latest Ref Rng 09/02/2023  IgE (Immunoglobulin E), Serum     6 - 495 IU/mL 2,728 (H)   D Pteronyssinus IgE     Class 0/I kU/L 0.19 !   D Farinae IgE     Class 0/I kU/L 0.19 !   Cat Dander IgE     Class V kU/L 61.00 !   Dog Dander IgE     Class III kU/L 2.08 !   [place] Grass IgE     Class 0 kU/L <0.10   Timothy Grass IgE     Class 0 kU/L <0.10   [place] Grass IgE     Class 0/I kU/L 0.18 !   Cockroach, [place] IgE     Class 0/I kU/L 0.13 !   Penicillium Chrysogen IgE     Class 0 kU/L <0.10   Cladosporium Herbarum IgE     Class 0 kU/L <0.10   Aspergillus Fumigatus IgE     Class 0/I kU/L 0.19 !   Alternaria Alternata IgE     Class 0/I kU/L 0.21 !   Maple/Box Elder IgE     Class 0 kU/L <0.10   Common Silver [place] IgE     Class 0 kU/L <0.10   [place], [place] IgE     Class 0 kU/L <0.10   Oak, White IgE     Class 0 kU/L <0.10   Elm, American IgE     Class 0/I kU/L 0.16 !   [place] IgE     Class 0/I kU/L 0.15 !   Pecan, [place]  IgE     Class 0 kU/L <0.10   White Mulberry IgE     Class 0/I kU/L 0.11 !   Ragweed, Short IgE     Class 0/I kU/L 0.11 !   Pigweed, Rough IgE     Class 0 kU/L <0.10   Sheep Sorrel IgE Qn     Class 0 kU/L <0.10   Mouse Urine IgE     Class 0 kU/L <0.10      Otherwise, there have been no changes to his past medical history, surgical history, family history, or social history.    Review of  systems otherwise negative other than that mentioned in the HPI.    Objective:   There were no vitals taken for this visit. There is no height or weight on file to calculate BMI.    Physical exam deferred since this was a skin testing appointment only.   Diagnostic studies:   Allergy Studies:     Intradermal - 09/14/23 1037     Time Antigen Placed 1037    Allergen Manufacturer Greer    Location Arm    Number of Test 7    Intradermal Select    Control 2+    [place] Negative    [place] Negative    7 Grass Negative    Weed Mix Negative    Mold 3 2+    Mold 4 2+          Allergy testing results were read and interpreted by myself, documented by clinical staff.      Marty Shaggy, MD  [facility] 

## 2023-09-14 NOTE — Patient Instructions (Addendum)
 1. Mild intermittent asthma, uncomplicated - Lung testing looked stellar at the last visit.  - I do not think that we need a daily controller medication at this time. - We can refill your albuterol  if you need a refill.   2. Chronic rhinitis - Testing today showed: grasses, ragweed, trees, indoor molds, outdoor molds, dust mites, cat, dog, and cockroach - Copy of test results provided.  - Avoidance measures provided. - Stop taking: Claritin  and Flonase  - Start taking: Carbinoxamine 4mg  tablet TWICE DAILY and Atrovent  (ipratropium) 0.03% one spray per nostril 2-3 times daily as needed (CAN BE OVER DRYING) - You can use an extra dose of the antihistamine, if needed, for breakthrough symptoms.  - Consider nasal saline rinses 1-2 times daily to remove allergens from the nasal cavities as well as help with mucous clearance (this is especially helpful to do before the nasal sprays are given) - Consider allergy shots as a means of long-term control. - Allergy shots re-train and reset the immune system to ignore environmental allergens and decrease the resulting immune response to those allergens (sneezing, itchy watery eyes, runny nose, nasal congestion, etc).    - Allergy shots improve symptoms in 75-85% of patients.  - We can discuss more at the next appointment if the medications are not working for you. - These can CURE allergies, but they are a time commitment.   3. Return in about 2 months (around 11/14/2023). You can have the follow up appointment with Dr. Iva or a Nurse Practicioner (our Nurse Practitioners are excellent and always have Physician oversight!).    Please inform us  of any Emergency Department visits, hospitalizations, or changes in symptoms. Call us  before going to the ED for breathing or allergy symptoms since we might be able to fit you in for a sick visit. Feel free to contact us  anytime with any questions, problems, or concerns.  It was a pleasure to see you again  today!  Websites that have reliable patient information: 1. American Academy of Asthma, Allergy, and Immunology: www.aaaai.org 2. Food Allergy Research and Education (FARE): foodallergy.org 3. Mothers of Asthmatics: http://www.asthmacommunitynetwork.org 4. American College of Allergy, Asthma, and Immunology: www.acaai.org      "Like" us  on Facebook and Instagram for our latest updates!      A healthy democracy works best when [employer] participate! Make sure you are registered to vote! If you have moved or changed any of your contact information, you will need to get this updated before voting! Scan the QR codes below to learn more!       Intradermal - 09/14/23 1037     Time Antigen Placed 1037    Allergen Manufacturer Greer    Location Arm    Number of Test 7    Intradermal Select    Control 2+    [place] Negative    [place] Negative    7 Grass Negative    Weed Mix Negative    Mold 3 2+    Mold 4 2+          Component     Latest Ref Rng 09/02/2023  IgE (Immunoglobulin E), Serum     6 - 495 IU/mL 2,728 (H)   D Pteronyssinus IgE     Class 0/I kU/L 0.19 !   D Farinae IgE     Class 0/I kU/L 0.19 !   Cat Dander IgE     Class V kU/L 61.00 !   Dog Dander IgE  Class III kU/L 2.08 !   [place] Grass IgE     Class 0 kU/L <0.10   Timothy Grass IgE     Class 0 kU/L <0.10   [place] Grass IgE     Class 0/I kU/L 0.18 !   Cockroach, [place] IgE     Class 0/I kU/L 0.13 !   Penicillium Chrysogen IgE     Class 0 kU/L <0.10   Cladosporium Herbarum IgE     Class 0 kU/L <0.10   Aspergillus Fumigatus IgE     Class 0/I kU/L 0.19 !   Alternaria Alternata IgE     Class 0/I kU/L 0.21 !   Maple/Box Elder IgE     Class 0 kU/L <0.10   Common Silver [place] IgE     Class 0 kU/L <0.10   [place], [place] IgE     Class 0 kU/L <0.10   Oak, White IgE     Class 0 kU/L <0.10   Elm, American IgE     Class 0/I kU/L 0.16 !   [place] IgE     Class 0/I kU/L 0.15 !   Pecan,  [place] IgE     Class 0 kU/L <0.10   White Mulberry IgE     Class 0/I kU/L 0.11 !   Ragweed, Short IgE     Class 0/I kU/L 0.11 !   Pigweed, Rough IgE     Class 0 kU/L <0.10   Sheep Sorrel IgE Qn     Class 0 kU/L <0.10   Mouse Urine IgE     Class 0 kU/L <0.10     [email or web address]  Control of Mold Allergen   Mold and fungi can grow on a variety of surfaces provided certain temperature and moisture conditions exist.  Outdoor molds grow on plants, decaying vegetation and soil.  The major outdoor mold, Alternaria and Cladosporium, are found in very high numbers during hot and dry conditions.  Generally, a late Summer - Fall peak is seen for common outdoor fungal spores.  Rain will temporarily lower outdoor mold spore count, but counts rise rapidly when the rainy period ends.  The most important indoor molds are Aspergillus and Penicillium.  Dark, humid and poorly ventilated basements are ideal sites for mold growth.  The next most common sites of mold growth are the bathroom and the kitchen.  Outdoor (Seasonal) Mold Control   Use air conditioning and keep windows closed Avoid exposure to decaying vegetation. Avoid leaf raking. Avoid grain handling. Consider wearing a face mask if working in moldy areas.   Indoor (Perennial) Mold Control    Maintain humidity below 50%. Clean washable surfaces with 5% bleach solution. Remove sources e.g. contaminated carpets.    Control of Dust Mite Allergen    Dust mites play a major role in allergic asthma and rhinitis.  They occur in environments with high humidity wherever human skin is found.  Dust mites absorb humidity from the atmosphere (ie, they do not drink) and feed on organic matter (including shed human and animal skin).  Dust mites are a microscopic type of insect that you cannot see with the naked eye.  High levels of dust mites have been detected from mattresses, pillows, carpets, upholstered furniture, bed covers, clothes, soft toys  and any woven material.  The principal allergen of the dust mite is found in its feces.  A gram of dust may contain 1,000 mites and 250,000 fecal particles.  Mite antigen is easily measured in the air  during house cleaning activities.  Dust mites do not bite and do not cause harm to humans, other than by triggering allergies/asthma.    Ways to decrease your exposure to dust mites in your home:  Encase mattresses, box springs and pillows with a mite-impermeable barrier or cover   Wash sheets, blankets and drapes weekly in hot water (130 F) with detergent and dry them in a dryer on the hot setting.  Have the room cleaned frequently with a vacuum cleaner and a damp dust-mop.  For carpeting or rugs, vacuuming with a vacuum cleaner equipped with a high-efficiency particulate air (HEPA) filter.  The dust mite allergic individual should not be in a room which is being cleaned and should wait 1 hour after cleaning before going into the room. Do not sleep on upholstered furniture (eg, couches).   If possible removing carpeting, upholstered furniture and drapery from the home is ideal.  Horizontal blinds should be eliminated in the rooms where the person spends the most time (bedroom, study, television room).  Washable vinyl, roller-type shades are optimal. Remove all non-washable stuffed toys from the bedroom.  Wash stuffed toys weekly like sheets and blankets above.   Reduce indoor humidity to less than 50%.  Inexpensive humidity monitors can be purchased at most hardware stores.  Do not use a humidifier as can make the problem worse and are not recommended.   Control of Dog or Cat Allergen  Avoidance is the best way to manage a dog or cat allergy. If you have a dog or cat and are allergic to dog or cats, consider removing the dog or cat from the home. If you have a dog or cat but don't want to find it a new home, or if your family wants a pet even though someone in the household is allergic, here are some  strategies that may help keep symptoms at bay:  Keep the pet out of your bedroom and restrict it to only a few rooms. Be advised that keeping the dog or cat in only one room will not limit the allergens to that room. Don't pet, hug or kiss the dog or cat; if you do, wash your hands with soap and water. High-efficiency particulate air (HEPA) cleaners run continuously in a bedroom or living room can reduce allergen levels over time. Regular use of a high-efficiency vacuum cleaner or a central vacuum can reduce allergen levels. Giving your dog or cat a bath at least once a week can reduce airborne allergen.  Control of Cockroach Allergen  Cockroach allergen has been identified as an important cause of acute attacks of asthma, especially in urban settings.  There are fifty-five species of cockroach that exist in the [place] , however only three, the [place], [place] and [place] species produce allergen that can affect patients with Asthma.  Allergens can be obtained from fecal particles, egg casings and secretions from cockroaches.    Remove food sources. Reduce access to water. Seal access and entry points. Spray runways with 0.5-1% Diazinon or Chlorpyrifos Blow boric acid power under stoves and refrigerator. Place bait stations (hydramethylnon) at feeding sites.  Allergy Shots  Allergies are the result of a chain reaction that starts in the immune system. Your immune system controls how your body defends itself. For instance, if you have an allergy to pollen, your immune system identifies pollen as an invader or allergen. Your immune system overreacts by producing antibodies called Immunoglobulin E (IgE). These antibodies travel to cells that release chemicals,  causing an allergic reaction.  The concept behind allergy immunotherapy, whether it is received in the form of shots or tablets, is that the immune system can be desensitized to specific allergens that trigger allergy symptoms.  Although it requires time and patience, the payback can be long-term relief. Allergy injections contain a dilute solution of those substances that you are allergic to based upon your skin testing and allergy history.   How Do Allergy Shots Work?  Allergy shots work much like a vaccine. Your body responds to injected amounts of a particular allergen given in increasing doses, eventually developing a resistance and tolerance to it. Allergy shots can lead to decreased, minimal or no allergy symptoms.  There generally are two phases: build-up and maintenance. Build-up often ranges from three to six months and involves receiving injections with increasing amounts of the allergens. The shots are typically given once or twice a week, though more rapid build-up schedules are sometimes used.  The maintenance phase begins when the most effective dose is reached. This dose is different for each person, depending on how allergic you are and your response to the build-up injections. Once the maintenance dose is reached, there are longer periods between injections, typically two to four weeks.  Occasionally doctors give cortisone-type shots that can temporarily reduce allergy symptoms. These types of shots are different and should not be confused with allergy immunotherapy shots.  Who Can Be Treated with Allergy Shots?  Allergy shots may be a good treatment approach for people with allergic rhinitis (hay fever), allergic asthma, conjunctivitis (eye allergy) or stinging insect allergy.   Before deciding to begin allergy shots, you should consider:   The length of allergy season and the severity of your symptoms  Whether medications and/or changes to your environment can control your symptoms  Your desire to avoid long-term medication use  Time: allergy immunotherapy requires a major time commitment  Cost: may vary depending on your insurance coverage  Allergy shots for children age 53 and older are  effective and often well tolerated. They might prevent the onset of new allergen sensitivities or the progression to asthma.  Allergy shots are not started on patients who are pregnant but can be continued on patients who become pregnant while receiving them. In some patients with other medical conditions or who take certain common medications, allergy shots may be of risk. It is important to mention other medications you talk to your allergist.   What are the two types of build-ups offered:   RUSH or Rapid Desensitization -- one day of injections lasting from 8:30-4:30pm, injections every 1 hour.  Approximately half of the build-up process is completed in that one day.  The following week, normal build-up is resumed, and this entails ~16 visits either weekly or twice weekly, until reaching your "maintenance dose" which is continued weekly until eventually getting spaced out to every month for a duration of 3 to 5 years. The regular build-up appointments are nurse visits where the injections are administered, followed by required monitoring for 30 minutes.    Traditional build-up -- weekly visits for 6 -12 months until reaching "maintenance dose", then continue weekly until eventually spacing out to every 4 weeks as above. At these appointments, the injections are administered, followed by required monitoring for 30 minutes.     Either way is acceptable, and both are equally effective. With the rush protocol, the advantage is that less time is spent here for injections overall AND you would also reach maintenance  dosing faster (which is when the clinical benefit starts to become more apparent). Not everyone is a candidate for rapid desensitization.   IF we proceed with the RUSH protocol, there are premedications which must be taken the day before and the day after the rush only (this includes antihistamines, steroids, and Singulair ).  After the rush day, no prednisone  or Singulair  is required, and we  just recommend antihistamines taken on your injection day.  What Is An Estimate of the Costs?  If you are interested in starting allergy injections, please check with your insurance company about your coverage for both allergy vial sets and allergy injections.  Please do so prior to making the appointment to start injections.  The following are CPT codes to give to your insurance company. These are the amounts we BILL to the insurance company, but the amount YOU WILL PAY and WE RECEIVE IS SUBSTANTIALLY LESS and depends on the contracts we have with different insurance companies.   Amount Billed to Insurance One allergy vial set  CPT 95165   $ 1200     Two allergy vial set  CPT 95165   $ 2400     Three allergy vial set  CPT 95165   $ 3600     One injection   CPT 95115   $ 35  Two injections   CPT 95117   $ 40 RUSH (Rapid Desensitization) CPT 95180 x 8 hours $500/hour  Regarding the allergy injections, your co-pay may or may not apply with each injection, so please confirm this with your insurance company. When you start allergy injections, 1 or 2 sets of vials are made based on your allergies.  Not all patients can be on one set of vials. A set of vials lasts 6 months to a year depending on how quickly you can proceed with your build-up of your allergy injections. Vials are personalized for each patient depending on their specific allergens.  How often are allergy injection given during the build-up period?   Injections are given at least weekly during the build-up period until your maintenance dose is achieved. Per the doctor's discretion, you may have the option of getting allergy injections two times per week during the build-up period. However, there must be at least 48 hours between injections. The build-up period is usually completed within 6-12 months depending on your ability to schedule injections and for adjustments for reactions. When maintenance dose is reached, your injection schedule  is gradually changed to every two weeks and later to every three weeks. Injections will then continue every 4 weeks. Usually, injections are continued for a total of 3-5 years.   When Will I Feel Better?  Some may experience decreased allergy symptoms during the build-up phase. For others, it may take as long as 12 months on the maintenance dose. If there is no improvement after a year of maintenance, your allergist will discuss other treatment options with you.  If you aren't responding to allergy shots, it may be because there is not enough dose of the allergen in your vaccine or there are missing allergens that were not identified during your allergy testing. Other reasons could be that there are high levels of the allergen in your environment or major exposure to non-allergic triggers like tobacco smoke.  What Is the Length of Treatment?  Once the maintenance dose is reached, allergy shots are generally continued for three to five years. The decision to stop should be discussed with your allergist at that  time. Some people may experience a permanent reduction of allergy symptoms. Others may relapse and a longer course of allergy shots can be considered.  What Are the Possible Reactions?  The two types of adverse reactions that can occur with allergy shots are local and systemic. Common local reactions include very mild redness and swelling at the injection site, which can happen immediately or several hours after. Report a delayed reaction from your last injection. These include arm swelling or runny nose, watery eyes or cough that occurs within 12-24 hours after injection. A systemic reaction, which is less common, affects the entire body or a particular body system. They are usually mild and typically respond quickly to medications. Signs include increased allergy symptoms such as sneezing, a stuffy nose or hives.   Rarely, a serious systemic reaction called anaphylaxis can develop. Symptoms  include swelling in the throat, wheezing, a feeling of tightness in the chest, nausea or dizziness. Most serious systemic reactions develop within 30 minutes of allergy shots. This is why it is strongly recommended you wait in your doctor's office for 30 minutes after your injections. Your allergist is trained to watch for reactions, and his or her staff is trained and equipped with the proper medications to identify and treat them.   Report to the nurse immediately if you experience any of the following symptoms: swelling, itching or redness of the skin, hives, watery eyes/nose, breathing difficulty, excessive sneezing, coughing, stomach pain, diarrhea, or light headedness. These symptoms may occur within 15-20 minutes after injection and may require medication.   Who Should Administer Allergy Shots?  The preferred location for receiving shots is your prescribing allergist's office. Injections can sometimes be given at another facility where the physician and staff are trained to recognize and treat reactions, and have received instructions by your prescribing allergist.  What if I am late for an injection?   Injection dose will be adjusted depending upon how many days or weeks you are late for your injection.   What if I am sick?   Please report any illness to the nurse before receiving injections. She may adjust your dose or postpone injections depending on your symptoms. If you have fever, flu, sinus infection or chest congestion it is best to postpone allergy injections until you are better. Never get an allergy injection if your asthma is causing you problems. If your symptoms persist, seek out medical care to get your health problem under control.  What If I am or Become Pregnant:  Women that become pregnant should schedule an appointment with [facility] before receiving any further allergy injections.

## 2023-09-15 ENCOUNTER — Telehealth: Payer: Self-pay

## 2023-09-15 ENCOUNTER — Encounter: Payer: Self-pay | Admitting: Allergy & Immunology

## 2023-09-15 ENCOUNTER — Ambulatory Visit: Payer: Self-pay | Admitting: Allergy & Immunology

## 2023-09-15 NOTE — Telephone Encounter (Signed)
 Gave pt a call to follow up on PAP [facility] (Arnuity) pt is coming up to do application under spreadsheet said pt will had met out of pocket around May/June left a HIPAA VM.

## 2023-09-16 NOTE — Telephone Encounter (Signed)
 Pt return call regarding his pap [facility] Arnuity said he is no longer taking this medication due to been to expensive he is now taking Flonase  and is going to a new allergy Dr. And wont need his pap any longer.

## 2023-09-20 DIAGNOSIS — K5900 Constipation, unspecified: Secondary | ICD-10-CM | POA: Diagnosis not present

## 2023-09-20 DIAGNOSIS — M62838 Other muscle spasm: Secondary | ICD-10-CM | POA: Diagnosis not present

## 2023-09-20 DIAGNOSIS — R102 Pelvic and perineal pain: Secondary | ICD-10-CM | POA: Diagnosis not present

## 2023-09-20 DIAGNOSIS — N3941 Urge incontinence: Secondary | ICD-10-CM | POA: Diagnosis not present

## 2023-09-20 DIAGNOSIS — M6281 Muscle weakness (generalized): Secondary | ICD-10-CM | POA: Diagnosis not present

## 2023-09-22 ENCOUNTER — Ambulatory Visit: Admitting: Family

## 2023-09-22 ENCOUNTER — Other Ambulatory Visit (HOSPITAL_BASED_OUTPATIENT_CLINIC_OR_DEPARTMENT_OTHER): Payer: Self-pay

## 2023-09-22 VITALS — BP 126/64 | HR 78 | Temp 98.5°F | Ht 66.25 in | Wt 202.2 lb

## 2023-09-22 DIAGNOSIS — J329 Chronic sinusitis, unspecified: Secondary | ICD-10-CM | POA: Diagnosis not present

## 2023-09-22 DIAGNOSIS — B9789 Other viral agents as the cause of diseases classified elsewhere: Secondary | ICD-10-CM | POA: Diagnosis not present

## 2023-09-22 MED ORDER — METHYLPREDNISOLONE ACETATE 40 MG/ML IJ SUSP
40.0000 mg | Freq: Once | INTRAMUSCULAR | Status: AC
  Administered 2023-09-22: 40 mg via INTRAMUSCULAR

## 2023-09-22 MED ORDER — CARBINOXAMINE MALEATE 4 MG PO TABS
4.0000 mg | ORAL_TABLET | Freq: Two times a day (BID) | ORAL | 3 refills | Status: DC
  Filled 2023-09-22: qty 56, 28d supply, fill #0

## 2023-09-22 NOTE — Patient Instructions (Addendum)
 It was very nice to see you today!   We gave you a steroid shot today to help you feel better and help your sinus symptoms. Increase the Ipratropium nasal spray to 2 squirts each side 3 times per day to help dry up the throat mucus. I have sent over a refill of the antihistamine that Dr Iva ordered for you to our [facility] to see if you can get a better price.  Ask if they accept the GoodRX or Singlecare discount cards I gave you, or just ask what their cash price is, sometimes this is cheaper than with insurance. If you can start this medication, stop the Claritin  (Loratidine) as they are similar and may dry you out too much. Use saline nasal spray or Neti pot 2-3 times per day to disinfect. Call Dr Laney office or send MyChart message back and tell them you were not given CPT codes! They should be able to see in your last visit note and based on your skin test results what shots you need.  Call Pulmonary office back about getting a new CPAP mask - you are most likely tired due to poor sleep due to not using your CPAP machine long enough.       PLEASE NOTE:  If you had any lab tests please let us  know if you have not heard back within a few days. You may see your results on MyChart before we have a chance to review them but we will give you a call once they are reviewed by us . If we ordered any referrals today, please let us  know if you have not heard from their office within the next week.

## 2023-09-22 NOTE — Progress Notes (Signed)
 Patient ID: Joshua [name], male    DOB: September 10, 1940, 83 y.o.   MRN: [id number]  Chief Complaint  Patient presents with   Facial Pain    Fore head pressure, states very tired. States he hasn't felt good since Apr 27 2023. Takes Tylenol  states it helps very little. States dizzyness, a lot of mucus in his throat. Pressure on chest. States cramps and diarrhea every morning usually. Has been to GI.   Discussed the use of AI scribe software for clinical note transcription with the patient, who gave verbal consent to proceed.  History of Present Illness Joshua [name] is an 83 year old male with chronic sinus issues and gastrointestinal symptoms who presents with ongoing sinus congestion and diarrhea.  Chronic sinonasal congestion and fatigue - Persistent sinus congestion with associated headaches and sinus pressure - Throat mucus present, slightly yellow in color - Fatigue and low energy levels - Ipratropium nasal spray used two to three times daily without relief - Claritin  taken daily for allergies  Gastrointestinal symptoms - Diarrhea and cramping with loose stools after use of stool softener and Miralax  - Daily abdominal cramps occur in the same location - Bentyl  trialed for cramping but worsened symptoms - Motegrity  not currently used  Sleep disturbance and daytime fatigue - CPAP machine used for sleep apnea, tolerated for only three to four hours nightly - Daytime fatigue attributed to poor sleep quality - Frequent nighttime awakenings, watches television before returning to sleep, resulting in disrupted sleep continuity  Assessment & Plan Chronic Sinusitis Symptoms persist with headaches, nasal discharge, and fatigue despite treatment. Ipratropium and Claritin  ineffective. Discussed nasal sprays and antihistamines currently used, but confusion as to which ones taking. States 2 new meds were started by his allergist, but one was too expensive. He was told to stop Claritin  and  Flonase  and start Ipratropium and new oral antihistamine. But he states the oral was too expensive, but it is generic. - Send the new Carbinoxamine  Maleate 4mg  to [facility], hopefully will offer better pricing - advised to take GoodRX card or ask to run cash price w/o insurance. - Administer DepoMedrol 80mg  injection today, advised on possible SE. - Increase ipratropium nasal spray to three times daily (per order review by Allergist, Dr Iva). - Follow up with Dr. Gallagher's PA at allergy  office to discuss the allergy  shots.  Allergic Rhinitis Current treatment ineffective. Allergy  shots discussed for long-term control, but when he asked via MyChart how to get started they asked for CPT codes??  - Follow up with allergy  specialist for allergy  shots and management.  Chronic Diarrhea Daily cramps and diarrhea persist. Normally has constipation. Stool softeners and Miralax  may contribute. Bentyl  ineffective. No longer on Motegrity . Under gastroenterologist care. - Stop using stool softeners and Miralax  until diarrhea resolves. - Follow up with gastroenterologist Dr. Albertus.  Obstructive Sleep Apnea Limited CPAP use due to discomfort. Poor sleep quality and fatigue persist. Various masks tried without success. - Follow up with pulmonologist for alternative CPAP masks.   Subjective:    Outpatient Medications Prior to Visit  Medication Sig Dispense Refill   acetaminophen  (TYLENOL ) 500 MG tablet Take 1,000 mg by mouth every 6 (six) hours as needed for moderate pain or headache.     albuterol  (PROAIR  HFA) 108 (90 Base) MCG/ACT inhaler Inhale 1-2 puffs into the lungs every 6 (six) hours as needed for wheezing or shortness of breath. 18 g 5   Alirocumab  (PRALUENT ) 150 MG/ML SOAJ INJECT 1 DOSE INTO  THE SKIN EVERY 14 (FOURTEEN) DAYS. 6 mL 3   amLODipine  (NORVASC ) 5 MG tablet TAKE 1 TABLET (5 MG TOTAL) BY MOUTH DAILY. 90 tablet 3   aspirin  EC 81 MG tablet Take 1 tablet (81 mg total) by mouth  2 (two) times daily after a meal. 30 tablet 11   cholecalciferol (VITAMIN D3) 25 MCG (1000 UNIT) tablet Take 1,000 Units by mouth daily.     Cyanocobalamin  (B-12) 2500 MCG TABS Take 1,250 mcg by mouth daily.     diazepam  (VALIUM ) 5 MG tablet TAKE 1 TABLET (5 MG TOTAL) BY MOUTH AT BEDTIME AS NEEDED (SLEEP). 30 tablet 0   EPINEPHrine  0.3 mg/0.3 mL IJ SOAJ injection Inject 0.3 mg into the muscle as needed for anaphylaxis. for allergic reaction     ezetimibe  (ZETIA ) 10 MG tablet TAKE 1 TABLET BY MOUTH EVERY DAY 90 tablet 2   folic acid  (FOLVITE ) 800 MCG tablet Take 800 mcg by mouth daily.      gabapentin  (NEURONTIN ) 300 MG capsule Take 2 cap in AM, 1 cap at noon, 3 caps at bedtime 540 capsule 3   Glucos-Chond-Hyal Ac-Ca Fructo (MOVE FREE JOINT HEALTH ADVANCE PO) Take 1 tablet by mouth daily.     hydrochlorothiazide  (HYDRODIURIL ) 25 MG tablet TAKE 1 TABLET BY MOUTH EVERY DAY (Patient taking differently: Take 12.5 mg by mouth daily.) 90 tablet 2   ipratropium (ATROVENT ) 0.03 % nasal spray Place 2 sprays into both nostrils 3 (three) times daily as needed for rhinitis. 30 mL 12   ketoconazole (NIZORAL) 2 % cream Apply 1 Application topically 2 (two) times daily as needed.     levothyroxine  (SYNTHROID ) 25 MCG tablet Take 1 tablet (25 mcg total) by mouth daily before breakfast. 30 tablet 1   Loratadine  (CLARITIN  PO) Take by mouth at bedtime.     MYRBETRIQ  50 MG TB24 tablet TAKE 1 TABLET BY MOUTH EVERY DAY (REPLACES GEMTESA ) 90 tablet 1   nitroGLYCERIN  (NITROSTAT ) 0.4 MG SL tablet Place 1 tablet (0.4 mg total) under the tongue every 5 (five) minutes as needed for chest pain. 30 tablet 5   pantoprazole  (PROTONIX ) 40 MG tablet TAKE 1 TABLET BY MOUTH EVERY DAY 90 tablet 3   polyethylene glycol powder (GLYCOLAX /MIRALAX ) powder Take 17 g by mouth daily. 255 g 0   valsartan  (DIOVAN ) 320 MG tablet TAKE 1 TABLET BY MOUTH EVERY DAY 90 tablet 3   Carbinoxamine  Maleate 4 MG TABS Take 1 tablet (4 mg total) by mouth in  the morning and at bedtime. (Patient not taking: Reported on 09/22/2023) 56 tablet 3   dicyclomine  (BENTYL ) 10 MG capsule TAKE 1 CAPSULE (10 MG TOTAL) BY MOUTH IN THE MORNING, AT NOON, AND AT BEDTIME. (Patient not taking: Reported on 09/22/2023) 270 capsule 0   docusate sodium  (COLACE) 100 MG capsule Take 100 mg by mouth 2 (two) times daily as needed for mild constipation. (Patient not taking: Reported on 09/22/2023)     fluticasone  (FLONASE ) 50 MCG/ACT nasal spray Place 1 spray into both nostrils daily. (Patient not taking: Reported on 09/22/2023)     Lidocaine  HCl (ASPERCREME LIDOCAINE ) 4 % CREA Apply topically in the morning and at bedtime. (Patient not taking: Reported on 09/22/2023)     Prucalopride Succinate  (MOTEGRITY ) 2 MG TABS Take 1 tablet (2 mg total) by mouth daily. (Patient not taking: Reported on 09/22/2023) 30 tablet 2   No facility-administered medications prior to visit.   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   CAD (coronary artery disease)    CABG 2001   Diverticulosis    DJD (degenerative joint disease)    BACK   GERD (gastroesophageal reflux disease)    H/O inguinal hernia repair 12/2018   History of gout    Hyperlipidemia    Hypertension    IBS (irritable bowel syndrome)    Obesity    OSA (obstructive sleep apnea)    USES CPAP    Pancreatitis ~ 1980   Peripheral vascular disease (HCC)    Pre-diabetes    Prostate cancer (HCC) 04/2010   Past Surgical History:  Procedure Laterality Date   ANTERIOR CERVICAL DECOMP/DISCECTOMY FUSION  05/26/2006   PLATE PLACED   BACK SURGERY     CARDIAC CATHETERIZATION  11/20/1999   CORONARY ARTERY BYPASS GRAFT  11/20/1999   SVG-RI1-RI2, SVG-OM, SVG-dRCA   CORONARY STENT INTERVENTION N/A 04/13/2019   Procedure: CORONARY STENT  INTERVENTION;  Surgeon: Verlin Lonni BIRCH, MD;  Location: [facility];  Service: Cardiovascular;  Laterality: N/A;   HERNIA REPAIR     INGUINAL HERNIA REPAIR Left 01/02/2019   Procedure: OPEN LEFT INGUINAL HERNIA REPAIR WITH MESH;  Surgeon: Gladis Cough, MD;  Location: [facility];  Service: General;  Laterality: Left;   IR EMBO ARTERIAL NOT HEMORR HEMANG INC GUIDE ROADMAPPING  03/08/2023   IR RADIOLOGIST EVAL & MGMT  02/05/2023   IR RADIOLOGIST EVAL & MGMT  04/05/2023   IR RADIOLOGIST EVAL & MGMT  04/09/2023   IR RADIOLOGIST EVAL & MGMT  04/30/2023   KNEE ARTHROSCOPY Right 1992; 04/2008   LAPAROSCOPIC CHOLECYSTECTOMY  2007   LEFT HEART CATH AND CORS/GRAFTS ANGIOGRAPHY N/A 04/13/2019   Procedure: LEFT HEART CATH AND CORS/GRAFTS ANGIOGRAPHY;  Surgeon: Verlin Lonni BIRCH, MD;  Location: [facility];  Service: Cardiovascular;  Laterality: N/A;   NASAL SEPTUM SURGERY Right 2007   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   ROBOT ASSISTED LAPAROSCOPIC RADICAL PROSTATECTOMY  04/2010   THYROIDECTOMY, PARTIAL  1980   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   Procedure: LEFT TOTAL KNEE ARTHROPLASTY;  Surgeon: Sheril Coy, MD;  Location: [facility];  Service: Orthopedics;  Laterality: Left;   Allergies  Allergen Reactions   Codeine Other (See Comments)    Trouble breathing   Lexapro  [Escitalopram ]     Dizziness   Bromfed Nausea And Vomiting   Clarithromycin Other (See Comments)     gastritis   Cymbalta  [Duloxetine  Hcl]     Abdominal pain and later chest pain as well as worsening fatigue    Doxycycline Hyclate Nausea And Vomiting   Modafinil Other (See Comments)     anxiety-nervousness   Oxycodone -Acetaminophen  Itching   Promethazine Hcl Other (See Comments)     fainting   Quinolones Nausea Only    Cipro  felt real bad   Telithromycin Nausea And Vomiting   Tramadol  Nausea Only   Benadryl [Diphenhydramine] Other (See Comments)    Pt told not to take because of bypass surgery    Hydrocodone -Acetaminophen  Rash   Repatha  [Evolocumab ]     Weak, worn out   Statins Other (See Comments)    Leg cramps and makes patient feel bad. Per pt tried multiple in years past   Sulfonamide Derivatives Rash      Objective:    Physical  Exam Vitals and nursing note reviewed.  Constitutional:      General: He is not in acute distress.    Appearance: Normal appearance.  HENT:     Head: Normocephalic.  Cardiovascular:     Rate and Rhythm: Normal rate and regular rhythm.  Pulmonary:     Effort: Pulmonary effort is normal.     Breath sounds: Normal breath sounds.  Musculoskeletal:        General: Normal range of motion.     Cervical back: Normal range of motion.  Skin:    General: Skin is warm and dry.  Neurological:     Mental Status: He is alert and oriented to person, place, and time.  Psychiatric:        Mood and Affect: Mood normal.    BP 126/64   Pulse 78   Temp 98.5 F (36.9 C) (Temporal)   Ht 5' 6.25 (1.683 m)   Wt 202 lb 3.2 oz (91.7 kg)   SpO2 95%   BMI 32.39 kg/m  Wt Readings from Last 3 Encounters:  09/22/23 202 lb 3.2 oz (91.7 kg)  09/02/23 202 lb 11.2 oz (91.9 kg)  08/26/23 204 lb (92.5 kg)      *Extra time ( ) spent with patient today which consisted of chart review, discussing multiple diagnoses, work up, treatment, answering questions, and documentation.  Lucius Krabbe, NP

## 2023-09-23 ENCOUNTER — Other Ambulatory Visit (HOSPITAL_BASED_OUTPATIENT_CLINIC_OR_DEPARTMENT_OTHER): Payer: Self-pay

## 2023-09-23 ENCOUNTER — Encounter: Payer: Self-pay | Admitting: Family Medicine

## 2023-09-28 DIAGNOSIS — N3941 Urge incontinence: Secondary | ICD-10-CM | POA: Diagnosis not present

## 2023-09-28 DIAGNOSIS — M6281 Muscle weakness (generalized): Secondary | ICD-10-CM | POA: Diagnosis not present

## 2023-09-28 DIAGNOSIS — K5900 Constipation, unspecified: Secondary | ICD-10-CM | POA: Diagnosis not present

## 2023-09-28 DIAGNOSIS — M62838 Other muscle spasm: Secondary | ICD-10-CM | POA: Diagnosis not present

## 2023-09-28 DIAGNOSIS — R102 Pelvic and perineal pain: Secondary | ICD-10-CM | POA: Diagnosis not present

## 2023-09-30 ENCOUNTER — Telehealth: Payer: Self-pay | Admitting: Pharmacist

## 2023-09-30 NOTE — Telephone Encounter (Signed)
 Patient called in to ask a question about levothyroxine . He noticied that the directions recommended he take on an empty stomach. He has been taking levothyroxine  around 4:30 and and eats breakfast around 7am. He wanted to know if this was enough time.  Discussed that he should take levothyroxine  at least 30 minutes prior to breakfast or taking other medications so how he has been taking is fine.

## 2023-10-01 ENCOUNTER — Other Ambulatory Visit: Payer: Self-pay | Admitting: Cardiology

## 2023-10-01 ENCOUNTER — Other Ambulatory Visit: Payer: Self-pay | Admitting: Family Medicine

## 2023-10-05 DIAGNOSIS — N3941 Urge incontinence: Secondary | ICD-10-CM | POA: Diagnosis not present

## 2023-10-05 DIAGNOSIS — M62838 Other muscle spasm: Secondary | ICD-10-CM | POA: Diagnosis not present

## 2023-10-05 DIAGNOSIS — R102 Pelvic and perineal pain: Secondary | ICD-10-CM | POA: Diagnosis not present

## 2023-10-05 DIAGNOSIS — K5900 Constipation, unspecified: Secondary | ICD-10-CM | POA: Diagnosis not present

## 2023-10-05 DIAGNOSIS — M6281 Muscle weakness (generalized): Secondary | ICD-10-CM | POA: Diagnosis not present

## 2023-10-12 DIAGNOSIS — N3941 Urge incontinence: Secondary | ICD-10-CM | POA: Diagnosis not present

## 2023-10-12 DIAGNOSIS — M62838 Other muscle spasm: Secondary | ICD-10-CM | POA: Diagnosis not present

## 2023-10-12 DIAGNOSIS — K5900 Constipation, unspecified: Secondary | ICD-10-CM | POA: Diagnosis not present

## 2023-10-12 DIAGNOSIS — M6281 Muscle weakness (generalized): Secondary | ICD-10-CM | POA: Diagnosis not present

## 2023-10-12 DIAGNOSIS — R102 Pelvic and perineal pain: Secondary | ICD-10-CM | POA: Diagnosis not present

## 2023-10-18 ENCOUNTER — Ambulatory Visit: Admitting: Primary Care

## 2023-10-18 VITALS — BP 127/72 | HR 41 | Ht 66.5 in | Wt 202.6 lb

## 2023-10-18 DIAGNOSIS — G4733 Obstructive sleep apnea (adult) (pediatric): Secondary | ICD-10-CM | POA: Diagnosis not present

## 2023-10-18 NOTE — Progress Notes (Signed)
 @Patient  ID: Joshua [name], male    DOB: June 04, 1940, 83 y.o.   MRN: [id number]  Chief Complaint  Patient presents with   Follow-up    Referring provider: Katrinka Garnette KIDD, MD  HPI: 83 year old male, former smoker quit 1994.  Past medical history significant for coronary artery disease, hypertension, aortic arthrosclerosis, OSA, asthma, allergic rhinitis, GERD, chronic kidney disease, hyperlipidemia, obesity.  Patient of Dr. Shellia, last seen on 06/10/22.  Previous [facility] pulmonary encounter:  06/10/22- Dr. Shellia  He uses CPAP nightly.  Still has trouble with mask leak if he doesn't tighten mask.  His cheeks will start to get sore after about 6 to 7 hours of CPAP use.  He takes mask off and sleeps for another hour.  He has been to sleep lab twice to get mask refitting done.  He isn't able to use nasal interfaces due to mask leak.    11/06/2022 Patient presents today for annual follow-up.  He is 100% compliant with CPAP use, average usage 6 hours 11 minutes per night.  Current pressure 6-12 cm H2O with residual AHI 2.4/hour.  He would like to change DME companies for CPAP supplies.  He is currently with [facility].  Asthma symptoms are well controlled. Follows with asthma and allergy .  He was changed to [facility] due to cost of Flovent . Inhaler is still costing him >100 dollars. He uses Albuterol  average once a year.   Airview download 10/06/2022 - 11/04/2022 Usage days 30/30 days (100%); 26 days (87%) greater than 4 hours Average usage 6 hours 11 minutes Pressure 6 to 12 cm H2O (11.3 cm H2O-95%) Air leak 6.2 L/min (95%) AHI 2.4  10/18/2023- Interim hx  Discussed the use of AI scribe software for clinical note transcription with the patient, who gave verbal consent to proceed.  History of Present Illness   Joshua [name] is an 83 year old male with sleep apnea who presents with issues related to CPAP use.  He experiences significant discomfort with his CPAP machine, primarily due to facial  pain caused by the mask. To prevent air leaks, he tightens the mask, which exacerbates the discomfort. Despite these issues, he uses the CPAP consistently every night, achieving an average of three hours of use, down from five to six hours previously. The discomfort worsens as the night progresses, and he often removes the mask after getting up to use the bathroom due to the pain.  He has tried alternative treatments in the past, including a mouth guard provided by a dentist, which he found uncomfortable, and a nasal device, which was ineffective. He has not had a sleep study in over twenty years, and his last experience with a mask fitting study was prior to onset of COVID-19.  He has a history of difficulty with medications, including an allergy  to codeine. He has concerns about trying weight loss medications due to past adverse reactions. During the review of symptoms, he denies waking up snoring or gasping. His wife has complained about air leaks from the mask, which he attempts to mitigate by tightening it further.     Airview download 12/25/22-01/23/24 Usage days 30/30 (100%); 23 days (77%) > 4 hours Average usage 5 hour 33 mins Pressure 6-12cm h20 (11cm h20-95%) Airleaks 5.9L (95%) AHI 1.8   Allergies  Allergen Reactions   Codeine Other (See Comments)    Trouble breathing   Lexapro  [Escitalopram ]     Dizziness   Bromfed Nausea And Vomiting   Clarithromycin Other (See Comments)  gastritis   Cymbalta  [Duloxetine  Hcl]     Abdominal pain and later chest pain as well as worsening fatigue    Doxycycline Hyclate Nausea And Vomiting   Modafinil Other (See Comments)     anxiety-nervousness   Oxycodone -Acetaminophen  Itching   Promethazine Hcl Other (See Comments)     fainting   Quinolones Nausea Only    Cipro  felt real bad   Telithromycin Nausea And Vomiting   Tramadol  Nausea Only   Benadryl [Diphenhydramine] Other (See Comments)    Pt told not to take because of bypass surgery    Hydrocodone -Acetaminophen  Rash   Repatha  [Evolocumab ]     Weak, worn out   Statins Other (See Comments)    Leg cramps and makes patient feel bad. Per pt tried multiple in years past   Sulfonamide Derivatives Rash    Immunization History  Administered Date(s) Administered   Fluad  Quad(high Dose 65+) 11/21/2018, 12/11/2020, 12/18/2021   Fluad  Trivalent(High Dose 65+) 12/08/2022   Influenza Split 01/08/2012, 11/12/2014   Influenza Whole 12/08/2007, 12/26/2008, 12/22/2010   Influenza, High Dose Seasonal PF 11/16/2015, 01/02/2016, 01/01/2017, 01/06/2018, 02/01/2019, 11/24/2019, 01/23/2020, 01/21/2021   Influenza,inj,Quad PF,6+ Mos 11/30/2012   Influenza-Unspecified 01/12/2014, 12/21/2017   PFIZER Comirnaty (Gray Top)Covid-19 Tri-Sucrose Vaccine 07/29/2020, 01/29/2022   PFIZER(Purple Top)SARS-COV-2 Vaccination 04/28/2019, 05/23/2019, 12/21/2019, 01/23/2020   PNEUMOCOCCAL CONJUGATE-20 09/26/2021   Pfizer Covid-19 Vaccine Bivalent Booster 58yrs & up 12/11/2020   Pfizer(Comirnaty )Fall Seasonal Vaccine 12 years and older 01/29/2022, 12/08/2022, 08/18/2023   Pneumococcal Conjugate-13 05/31/2014   Pneumococcal Polysaccharide-23 06/04/2006, 07/15/2012, 01/02/2016, 12/31/2016, 01/23/2020, 01/21/2021   Respiratory Syncytial Virus Vaccine ,Recomb Aduvanted(Arexvy ) 02/27/2022   Td 04/23/2005, 05/13/2021   Tdap 02/18/2016   Zoster Recombinant(Shingrix) 01/31/2018, 04/02/2018   Zoster, Live 01/08/2012    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   CAD (coronary artery disease)    CABG 2001   Diverticulosis    DJD (degenerative joint disease)    BACK   GERD (gastroesophageal reflux disease)    H/O inguinal hernia repair 12/2018   History of gout    Hyperlipidemia    Hypertension    IBS  (irritable bowel syndrome)    Obesity    OSA (obstructive sleep apnea)    USES CPAP    Pancreatitis ~ 1980   Peripheral vascular disease (HCC)    Pre-diabetes    Prostate cancer (HCC) 04/2010    Tobacco History: Social History   Tobacco Use  Smoking Status Former   Current packs/day: 0.00   Average packs/day: 1 pack/day for 35.0 years (35.0 ttl pk-yrs)   Types: Cigarettes   Start date: 06/16/1957   Quit date: 06/16/1992   Years since quitting: 31.3   Passive exposure: Past  Smokeless Tobacco Never   Counseling given: Not Answered   Outpatient Medications Prior to Visit  Medication Sig Dispense Refill   acetaminophen  (TYLENOL ) 500 MG tablet Take 1,000 mg by mouth every 6 (six) hours as needed for moderate pain or headache.     albuterol  (PROAIR  HFA) 108 (90 Base) MCG/ACT inhaler Inhale 1-2 puffs into the lungs every 6 (six) hours as needed for wheezing or shortness of breath. 18 g 5   Alirocumab  (PRALUENT ) 150 MG/ML SOAJ INJECT 1 DOSE INTO THE SKIN EVERY 14 (FOURTEEN) DAYS. 6  mL 3   amLODipine  (NORVASC ) 5 MG tablet TAKE 1 TABLET (5 MG TOTAL) BY MOUTH DAILY. 90 tablet 3   aspirin  EC 81 MG tablet Take 1 tablet (81 mg total) by mouth 2 (two) times daily after a meal. 30 tablet 11   Carbinoxamine  Maleate 4 MG TABS Take 1 tablet (4 mg total) by mouth in the morning and at bedtime. 56 tablet 3   cholecalciferol (VITAMIN D3) 25 MCG (1000 UNIT) tablet Take 1,000 Units by mouth daily.     Cyanocobalamin  (B-12) 2500 MCG TABS Take 1,250 mcg by mouth daily.     diazepam  (VALIUM ) 5 MG tablet TAKE 1 TABLET (5 MG TOTAL) BY MOUTH AT BEDTIME AS NEEDED (SLEEP). 30 tablet 0   EPINEPHrine  0.3 mg/0.3 mL IJ SOAJ injection Inject 0.3 mg into the muscle as needed for anaphylaxis. for allergic reaction     ezetimibe  (ZETIA ) 10 MG tablet TAKE 1 TABLET BY MOUTH EVERY DAY 90 tablet 2   folic acid  (FOLVITE ) 800 MCG tablet Take 800 mcg by mouth daily.      gabapentin  (NEURONTIN ) 300 MG capsule Take 2 cap in  AM, 1 cap at noon, 3 caps at bedtime 540 capsule 3   Glucos-Chond-Hyal Ac-Ca Fructo (MOVE FREE JOINT HEALTH ADVANCE PO) Take 1 tablet by mouth daily.     hydrochlorothiazide  (HYDRODIURIL ) 25 MG tablet TAKE 1 TABLET BY MOUTH EVERY DAY 90 tablet 2   ipratropium (ATROVENT ) 0.03 % nasal spray Place 2 sprays into both nostrils 3 (three) times daily as needed for rhinitis. 30 mL 12   ketoconazole (NIZORAL) 2 % cream Apply 1 Application topically 2 (two) times daily as needed.     levothyroxine  (SYNTHROID ) 25 MCG tablet Take 1 tablet (25 mcg total) by mouth daily before breakfast. 30 tablet 1   Loratadine  (CLARITIN  PO) Take by mouth at bedtime.     MYRBETRIQ  50 MG TB24 tablet TAKE 1 TABLET BY MOUTH EVERY DAY (REPLACES GEMTESA ) 90 tablet 1   nitroGLYCERIN  (NITROSTAT ) 0.4 MG SL tablet Place 1 tablet (0.4 mg total) under the tongue every 5 (five) minutes as needed for chest pain. 30 tablet 5   pantoprazole  (PROTONIX ) 40 MG tablet TAKE 1 TABLET BY MOUTH EVERY DAY 90 tablet 3   polyethylene glycol powder (GLYCOLAX /MIRALAX ) powder Take 17 g by mouth daily. 255 g 0   valsartan  (DIOVAN ) 320 MG tablet TAKE 1 TABLET BY MOUTH EVERY DAY 90 tablet 3   lubiprostone  (AMITIZA ) 8 MCG capsule TAKE 1 CAPSULE BY MOUTH 2 TIMES DAILY WITH A MEAL.(GOOD RX CHEAPER) Oral; Duration: 90 Days     No facility-administered medications prior to visit.      Review of Systems  Review of Systems  Constitutional: Negative.   HENT: Negative.    Respiratory: Negative.    Cardiovascular: Negative.      Physical Exam  BP 127/72 (BP Location: Right Arm, Patient Position: Sitting, Cuff Size: Normal)   Pulse (!) 41   Ht 5' 6.5 (1.689 m)   Wt 202 lb 9.6 oz (91.9 kg)   SpO2 96%   BMI 32.21 kg/m  Physical Exam Constitutional:      Appearance: Normal appearance.  HENT:     Head: Normocephalic and atraumatic.  Cardiovascular:     Rate and Rhythm: Normal rate and regular rhythm.  Pulmonary:     Effort: Pulmonary effort is  normal.     Breath sounds: Normal breath sounds.  Musculoskeletal:        General: Normal  range of motion.  Skin:    General: Skin is warm and dry.  Neurological:     General: No focal deficit present.     Mental Status: He is alert and oriented to person, place, and time. Mental status is at baseline.  Psychiatric:        Mood and Affect: Mood normal.        Behavior: Behavior normal.        Thought Content: Thought content normal.        Judgment: Judgment normal.      Lab Results:  CBC    Component Value Date/Time   WBC 7.9 07/10/2023 1254   RBC 4.77 07/10/2023 1254   HGB 14.9 07/10/2023 1254   HGB 14.8 12/23/2021 1552   HCT 41.8 07/10/2023 1254   HCT 42.8 12/23/2021 1552   PLT 207 07/10/2023 1254   PLT 274 12/23/2021 1552   MCV 87.6 07/10/2023 1254   MCV 89 12/23/2021 1552   MCH 31.2 07/10/2023 1254   MCHC 35.6 07/10/2023 1254   RDW 12.0 07/10/2023 1254   RDW 11.9 12/23/2021 1552   LYMPHSABS 1.4 07/10/2023 1254   MONOABS 0.8 07/10/2023 1254   EOSABS 0.4 07/10/2023 1254   BASOSABS 0.1 07/10/2023 1254    BMET    Component Value Date/Time   NA 139 07/10/2023 1254   NA 140 12/23/2021 1552   K 3.9 07/10/2023 1254   CL 104 07/10/2023 1254   CO2 26 07/10/2023 1254   GLUCOSE 89 07/10/2023 1254   BUN 17 07/10/2023 1254   BUN 21 12/23/2021 1552   CREATININE 1.24 07/10/2023 1254   CREATININE 1.33 (H) 02/23/2020 0933   CALCIUM  9.6 07/10/2023 1254   GFRNONAA 59 (L) 07/10/2023 1254   GFRNONAA 51 (L) 02/23/2020 0933   GFRAA 59 (L) 02/23/2020 0933    BNP    Component Value Date/Time   BNP 32 10/01/2017 1703    ProBNP No results found for: PROBNP  Imaging: No results found.   Assessment & Plan:   1. OSA (obstructive sleep apnea) (Primary) - Desensitization mask fit; Future - Ambulatory Referral for DME  Assessment and Plan    Obstructive Sleep Apnea Chronic obstructive sleep apnea with difficulty tolerating CPAP due to mask discomfort and air  leaks. Previous attempts with oral appliance and nasal device were unsuccessful. CPAP mask causes facial pain and discomfort, leading to reduced usage time. Pressure settings currently range from 6 to 12, with an apnea score of 1.8 and air leaks at 5.9. He is not experiencing significant snoring or gasping but reports discomfort leading to removal of the mask during the night. Discussed alternative treatments including oral appliance, Inspire implant, and weight loss medication (Zepbound), but he has concerns about medication side effects and cost. Shared decision to pursue mask desensitization study for better fit and comfort. - Order mask desensitization study at [facility] for CPAP mask fitting. - Adjust CPAP pressure to a set pressure of 10cm h20  - Instruct him to reach out if issues with mask persist.  Almarie LELON Ferrari, NP 10/18/2023

## 2023-10-18 NOTE — Patient Instructions (Addendum)
  VISIT SUMMARY: Today, we discussed your ongoing issues with using your CPAP machine for sleep apnea, particularly the discomfort and facial pain caused by the mask. We reviewed your history with alternative treatments and your concerns about medication side effects. We also talked about your current CPAP settings and usage patterns.  YOUR PLAN: -OBSTRUCTIVE SLEEP APNEA: Obstructive sleep apnea is a condition where your airway becomes blocked during sleep, causing breathing interruptions. We discussed your difficulties with the CPAP mask, including facial pain and air leaks, which have reduced your usage time. We decided to pursue a mask desensitization study to find a better fit and improve comfort. Your CPAP pressure will be adjusted to a set pressure of 10.5. Please reach out if you continue to have issues with the mask.  INSTRUCTIONS: You will have a mask desensitization study at [facility] to help find a more comfortable CPAP mask fit. Your CPAP pressure has been adjusted to 10. Please contact us  if you continue to experience discomfort or other issues with your mask.  Follow-up: 1 year with Beth NP or sooner if having any issues with your CPAP

## 2023-10-19 ENCOUNTER — Other Ambulatory Visit: Payer: Self-pay | Admitting: Family Medicine

## 2023-10-19 NOTE — Procedures (Signed)
 The patient brought his mask from home; ResMed F20 Airtouch, size Medium. Standard headgear. His mask is a proper fit. In the lab we tried two different style masks; Resmed Airfit F30 (nasal-oral) size medium cushion. Pt liked it except for the part on his upper lip and nares. Said it hurt after <5 min. Next, tried the ResMed Airfit N20 nasal mask, size medium. Again, too much pressure on upper lip and gums. I explained we would have to go back to the full face style and he wanted to keep the foam cushion he has. He states his main issue is leak during parts of the night and he doesn't want the red frowny face. I suggested we put a new headgear on his mask incase it was stretched. His cushion was newly changed. He tried it on/ tech adjusted mask while supine for 5 min. Pressure range tested 5 cm/H2o- 14 cm/H2O. with some adjustment, and testing with jaw drop; we managed to maintain a leak of 0-3. Patient will reach out to his provider if he continues to have issues.

## 2023-10-20 NOTE — Progress Notes (Unsigned)
 [name] [address] [place] [address] Phone: [phone] Subjective:   [name], am serving as a scribe for Dr. Arthea Claudene.  I'm seeing this patient by the request  of:  Joshua Garnette KIDD, MD  CC: Chronic pain follow-up  YEP:Dlagzrupcz  08/26/2023 Significant difficulties still occurring.  Unable to get the peripheral sodium channel blocker secondary to cost concern to start extremity medication but we did discuss the possibility of Synthroid  at a very low dose that sometimes can help amplify the results of gabapentin  follow-up with me again in 2 months to see how patient is responding.      Update 10/21/2023 Joshua [name] is a 83 y.o. male coming in with complaint of neuropathy. Patient states L elbow tender, hips, and R side. Can't sleep on R side. Everything is bothersome. Knees and back are getting worse.    Reviewing chart has been seen for chronic obstructive sleep apnea.  Has worked up with immunology where patient does have a lot of reactivity.  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   CAD (coronary artery disease)    CABG 2001   Diverticulosis    DJD (degenerative joint disease)    BACK   GERD (gastroesophageal reflux disease)    H/O inguinal hernia repair 12/2018   History of gout    Hyperlipidemia    Hypertension    IBS (irritable bowel syndrome)    Obesity    OSA (obstructive sleep apnea)    USES CPAP    Pancreatitis ~ 1980   Peripheral vascular disease (HCC)    Pre-diabetes    Prostate cancer (HCC) 04/2010   Past Surgical History:  Procedure Laterality Date   ANTERIOR CERVICAL DECOMP/DISCECTOMY FUSION  05/26/2006   PLATE PLACED   BACK SURGERY     CARDIAC CATHETERIZATION  11/20/1999   CORONARY  ARTERY BYPASS GRAFT  11/20/1999   SVG-RI1-RI2, SVG-OM, SVG-dRCA   CORONARY STENT INTERVENTION N/A 04/13/2019   Procedure: CORONARY STENT INTERVENTION;  Surgeon: Verlin Lonni BIRCH, MD;  Location: [facility];  Service: Cardiovascular;  Laterality: N/A;   HERNIA REPAIR     INGUINAL HERNIA REPAIR Left 01/02/2019   Procedure: OPEN LEFT INGUINAL HERNIA REPAIR WITH MESH;  Surgeon: Gladis Cough, MD;  Location: [facility];  Service: General;  Laterality: Left;   IR EMBO ARTERIAL NOT HEMORR HEMANG INC GUIDE ROADMAPPING  03/08/2023   IR RADIOLOGIST EVAL & MGMT  02/05/2023   IR RADIOLOGIST EVAL & MGMT  04/05/2023   IR RADIOLOGIST EVAL & MGMT  04/09/2023   IR RADIOLOGIST EVAL & MGMT  04/30/2023   KNEE ARTHROSCOPY Right 1992; 04/2008   LAPAROSCOPIC CHOLECYSTECTOMY  2007   LEFT HEART CATH AND CORS/GRAFTS ANGIOGRAPHY N/A 04/13/2019   Procedure: LEFT HEART CATH AND CORS/GRAFTS ANGIOGRAPHY;  Surgeon: Verlin Lonni BIRCH, MD;  Location: [facility];  Service: Cardiovascular;  Laterality: N/A;   NASAL SEPTUM SURGERY Right 2007   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   ROBOT ASSISTED LAPAROSCOPIC RADICAL PROSTATECTOMY  04/2010   THYROIDECTOMY, PARTIAL  1980   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   Procedure: LEFT TOTAL KNEE ARTHROPLASTY;  Surgeon: Sheril Coy, MD;  Location: [facility];  Service: Orthopedics;  Laterality: Left;   Social History   Socioeconomic History   Marital status: Married    Spouse name: Not on file   Number of children: Not on file   Years of education: Not on file   Highest education level: Associate degree: occupational, Scientist, product/process development, or vocational program  Occupational History   Occupation: retired    Associate Professor: RETIRED  Tobacco Use   Smoking status: Former    Current packs/day: 0.00    Average packs/day: 1 pack/day for 35.0 years (35.0 ttl pk-yrs)    Types: Cigarettes    Start date: 06/16/1957    Quit date: 06/16/1992    Years since quitting: 31.3    Passive  exposure: Past   Smokeless tobacco: Never  Vaping Use   Vaping status: Never Used  Substance and Sexual Activity   Alcohol use: Yes    Alcohol/week: 2.0 standard drinks of alcohol    Types: 2 Shots of liquor per week    Comment: occ   Drug use: No   Sexual activity: Not Currently    Partners: Female  Other Topics Concern   Not on file  Social History Narrative   Married 42 years in 2015. 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      Hobbies: poker, movies and tv, staying active      Right handed   2 level home with steps he uses   Social Drivers of Corporate investment banker Strain: Low Risk  (06/10/2023)   Overall Financial Resource Strain (CARDIA)    Difficulty of Paying Living Expenses: Not hard at all  Food Insecurity: No Food Insecurity (06/10/2023)   Hunger Vital Sign    Worried About Running Out of Food in the Last Year: Never true    Ran Out of Food in the Last Year: Never true  Transportation Needs: No Transportation Needs (06/10/2023)   PRAPARE - Administrator, Civil Service (Medical): No    Lack of Transportation (Non-Medical): No  Physical Activity: Insufficiently Active (06/10/2023)   Exercise Vital Sign    Days of Exercise per Week: 4 days    Minutes of Exercise per Session: 10 min  Stress: No Stress Concern Present (06/10/2023)   [employer] of Occupational Health - Occupational Stress Questionnaire    Feeling of Stress : Not at all  Recent Concern: Stress - Stress Concern Present (05/21/2023)   [employer] of Occupational Health - Occupational Stress Questionnaire    Feeling of Stress : To some extent  Social Connections: Moderately Isolated (06/10/2023)   Social Connection and Isolation Panel    Frequency of Communication with Friends and Family: More than three times a week    Frequency of Social Gatherings with Friends and Family: More than three times a week     Attends Religious Services: Never    Database administrator or Organizations: No    Attends Engineer, structural: Never    Marital Status: Married   Allergies  Allergen Reactions   Codeine Other (See Comments)    Trouble breathing   Lexapro  [Escitalopram ]     Dizziness   Bromfed Nausea And Vomiting   Clarithromycin Other (See Comments)     gastritis   Cymbalta  [Duloxetine  Hcl]     Abdominal pain and later chest pain as well as worsening fatigue    Doxycycline Hyclate Nausea And Vomiting  Modafinil Other (See Comments)     anxiety-nervousness   Oxycodone -Acetaminophen  Itching   Promethazine Hcl Other (See Comments)     fainting   Quinolones Nausea Only    Cipro  felt real bad   Telithromycin Nausea And Vomiting   Tramadol  Nausea Only   Benadryl [Diphenhydramine] Other (See Comments)    Pt told not to take because of bypass surgery   Hydrocodone -Acetaminophen  Rash   Repatha  [Evolocumab ]     Weak, worn out   Statins Other (See Comments)    Leg cramps and makes patient feel bad. Per pt tried multiple in years past   Sulfonamide Derivatives Rash   Family History  Problem Relation Age of Onset   Heart disease Mother    Hyperlipidemia Mother    Heart disease Father    Hyperlipidemia Father    Diabetes Brother    Colon cancer Neg Hx    Pancreatic cancer Neg Hx    Esophageal cancer Neg Hx    Stomach cancer Neg Hx    Liver cancer Neg Hx     Current Outpatient Medications (Endocrine & Metabolic):    levothyroxine  (SYNTHROID ) 25 MCG tablet, TAKE 1 TABLET BY MOUTH DAILY BEFORE BREAKFAST.  Current Outpatient Medications (Cardiovascular):    Alirocumab  (PRALUENT ) 150 MG/ML SOAJ, INJECT 1 DOSE INTO THE SKIN EVERY 14 (FOURTEEN) DAYS.   amLODipine  (NORVASC ) 5 MG tablet, TAKE 1 TABLET (5 MG TOTAL) BY MOUTH DAILY.   EPINEPHrine  0.3 mg/0.3 mL IJ SOAJ injection, Inject 0.3 mg into the muscle as needed for anaphylaxis. for allergic reaction   ezetimibe  (ZETIA ) 10 MG  tablet, TAKE 1 TABLET BY MOUTH EVERY DAY   hydrochlorothiazide  (HYDRODIURIL ) 25 MG tablet, TAKE 1 TABLET BY MOUTH EVERY DAY   nitroGLYCERIN  (NITROSTAT ) 0.4 MG SL tablet, Place 1 tablet (0.4 mg total) under the tongue every 5 (five) minutes as needed for chest pain.   valsartan  (DIOVAN ) 320 MG tablet, TAKE 1 TABLET BY MOUTH EVERY DAY  Current Outpatient Medications (Respiratory):    albuterol  (PROAIR  HFA) 108 (90 Base) MCG/ACT inhaler, Inhale 1-2 puffs into the lungs every 6 (six) hours as needed for wheezing or shortness of breath.   Carbinoxamine  Maleate 4 MG TABS, Take 1 tablet (4 mg total) by mouth in the morning and at bedtime.   ipratropium (ATROVENT ) 0.03 % nasal spray, Place 2 sprays into both nostrils 3 (three) times daily as needed for rhinitis.   Loratadine  (CLARITIN  PO), Take by mouth at bedtime.  Current Outpatient Medications (Analgesics):    acetaminophen  (TYLENOL ) 500 MG tablet, Take 1,000 mg by mouth every 6 (six) hours as needed for moderate pain or headache.   aspirin  EC 81 MG tablet, Take 1 tablet (81 mg total) by mouth 2 (two) times daily after a meal.  Current Outpatient Medications (Hematological):    Cyanocobalamin  (B-12) 2500 MCG TABS, Take 1,250 mcg by mouth daily.   folic acid  (FOLVITE ) 800 MCG tablet, Take 800 mcg by mouth daily.   Current Outpatient Medications (Other):    cholecalciferol (VITAMIN D3) 25 MCG (1000 UNIT) tablet, Take 1,000 Units by mouth daily.   diazepam  (VALIUM ) 5 MG tablet, TAKE 1 TABLET (5 MG TOTAL) BY MOUTH AT BEDTIME AS NEEDED (SLEEP).   gabapentin  (NEURONTIN ) 300 MG capsule, Take 2 cap in AM, 1 cap at noon, 3 caps at bedtime   Glucos-Chond-Hyal Ac-Ca Fructo (MOVE FREE JOINT HEALTH ADVANCE PO), Take 1 tablet by mouth daily.   ketoconazole (NIZORAL) 2 % cream, Apply 1 Application topically 2 (  two) times daily as needed.   lubiprostone  (AMITIZA ) 8 MCG capsule, TAKE 1 CAPSULE BY MOUTH 2 TIMES DAILY WITH A MEAL.(GOOD RX CHEAPER) Oral; Duration:  90 Days   MYRBETRIQ  50 MG TB24 tablet, TAKE 1 TABLET BY MOUTH EVERY DAY (REPLACES GEMTESA )   pantoprazole  (PROTONIX ) 40 MG tablet, TAKE 1 TABLET BY MOUTH EVERY DAY   polyethylene glycol powder (GLYCOLAX /MIRALAX ) powder, Take 17 g by mouth daily.   Reviewed prior external information including notes and imaging from  primary care provider As well as notes that were available from care everywhere and other healthcare systems.  Past medical history, social, surgical and family history all reviewed in electronic medical record.  No pertanent information unless stated regarding to the chief complaint.   Review of Systems:  POSITIVE muscle aches, body aches, joint swelling multiple other complaints  Objective  Blood pressure (!) 118/58, pulse (!) 45, height 5' 6 (1.676 m), weight 202 lb (91.6 kg), SpO2 97%.   General: No apparent distress alert and oriented x3 mood and affect normal, dressed appropriately.  HEENT: Pupils equal, extraocular movements intact  Respiratory: Patient's speak in full sentences and does not appear short of breath  Sitting relatively comfortably, patient does have some mild difficulty getting in and out of a seated position.  Some tenderness and just lateral to the axial skeleton especially in the lower back more in the sacrococcygeal area as well as the lumbosacral area.    Impression and Recommendations:     The above documentation has been reviewed and is accurate and complete Willie Loy M Jessicamarie Amiri, DO

## 2023-10-21 ENCOUNTER — Ambulatory Visit: Payer: Self-pay | Admitting: Family Medicine

## 2023-10-21 ENCOUNTER — Ambulatory Visit: Admitting: Family Medicine

## 2023-10-21 VITALS — BP 118/58 | HR 45 | Ht 66.0 in | Wt 202.0 lb

## 2023-10-21 DIAGNOSIS — G8929 Other chronic pain: Secondary | ICD-10-CM | POA: Diagnosis not present

## 2023-10-21 DIAGNOSIS — H25013 Cortical age-related cataract, bilateral: Secondary | ICD-10-CM | POA: Diagnosis not present

## 2023-10-21 DIAGNOSIS — M5442 Lumbago with sciatica, left side: Secondary | ICD-10-CM

## 2023-10-21 DIAGNOSIS — H2513 Age-related nuclear cataract, bilateral: Secondary | ICD-10-CM | POA: Diagnosis not present

## 2023-10-21 DIAGNOSIS — H5203 Hypermetropia, bilateral: Secondary | ICD-10-CM | POA: Diagnosis not present

## 2023-10-21 DIAGNOSIS — M2550 Pain in unspecified joint: Secondary | ICD-10-CM

## 2023-10-21 LAB — T4, FREE: Free T4: 0.94 ng/dL (ref 0.60–1.60)

## 2023-10-21 LAB — TSH: TSH: 1 u[IU]/mL (ref 0.35–5.50)

## 2023-10-21 LAB — T3, FREE: T3, Free: 2.8 pg/mL (ref 2.3–4.2)

## 2023-10-21 NOTE — Assessment & Plan Note (Addendum)
 Continues to have chronic back pain and pain all over.  Getting significantly improved with her treatment.  Did feel maybe some mild improvement with the Synthroid  that we did start.  Discussed icing regimen of home exercises, which activities to do in which ones to avoid.  Increase activity slowly.  Discussed icing regimen.  Follow-up again in 6 to 8 weeks.  Recheck TSH to see if we have any more room to potentially make changes.  Patient is on a very low dose at this moment.  Total time with patient today 31 minutes

## 2023-10-21 NOTE — Patient Instructions (Signed)
 Labs today Good to see you! See you again in 2 months

## 2023-10-25 DIAGNOSIS — G4733 Obstructive sleep apnea (adult) (pediatric): Secondary | ICD-10-CM | POA: Diagnosis not present

## 2023-10-26 DIAGNOSIS — M6281 Muscle weakness (generalized): Secondary | ICD-10-CM | POA: Diagnosis not present

## 2023-10-26 DIAGNOSIS — M62838 Other muscle spasm: Secondary | ICD-10-CM | POA: Diagnosis not present

## 2023-10-26 DIAGNOSIS — R102 Pelvic and perineal pain: Secondary | ICD-10-CM | POA: Diagnosis not present

## 2023-10-26 DIAGNOSIS — K5900 Constipation, unspecified: Secondary | ICD-10-CM | POA: Diagnosis not present

## 2023-10-26 DIAGNOSIS — N3941 Urge incontinence: Secondary | ICD-10-CM | POA: Diagnosis not present

## 2023-10-27 ENCOUNTER — Ambulatory Visit: Payer: Self-pay | Admitting: Family Medicine

## 2023-10-27 ENCOUNTER — Ambulatory Visit (INDEPENDENT_AMBULATORY_CARE_PROVIDER_SITE_OTHER): Admitting: Family Medicine

## 2023-10-27 ENCOUNTER — Encounter: Payer: Self-pay | Admitting: Family Medicine

## 2023-10-27 VITALS — BP 132/72 | HR 70 | Temp 97.2°F | Ht 66.0 in | Wt 201.4 lb

## 2023-10-27 DIAGNOSIS — I2510 Atherosclerotic heart disease of native coronary artery without angina pectoris: Secondary | ICD-10-CM

## 2023-10-27 DIAGNOSIS — R1010 Upper abdominal pain, unspecified: Secondary | ICD-10-CM | POA: Diagnosis not present

## 2023-10-27 DIAGNOSIS — I10 Essential (primary) hypertension: Secondary | ICD-10-CM

## 2023-10-27 DIAGNOSIS — R1032 Left lower quadrant pain: Secondary | ICD-10-CM

## 2023-10-27 DIAGNOSIS — N183 Chronic kidney disease, stage 3 unspecified: Secondary | ICD-10-CM | POA: Diagnosis not present

## 2023-10-27 LAB — URINALYSIS, ROUTINE W REFLEX MICROSCOPIC
Bilirubin Urine: NEGATIVE
Hgb urine dipstick: NEGATIVE
Ketones, ur: NEGATIVE
Leukocytes,Ua: NEGATIVE
Nitrite: NEGATIVE
RBC / HPF: NONE SEEN (ref 0–?)
Specific Gravity, Urine: <=1.005 — AB (ref 1.000–1.030)
Total Protein, Urine: NEGATIVE
Urine Glucose: NEGATIVE
Urobilinogen, UA: 0.2 (ref 0.0–1.0)
WBC, UA: NONE SEEN (ref 0–?)
pH: 6 (ref 5.0–8.0)

## 2023-10-27 LAB — COMPREHENSIVE METABOLIC PANEL WITH GFR
ALT: 17 U/L (ref 0–53)
AST: 15 U/L (ref 0–37)
Albumin: 4.3 g/dL (ref 3.5–5.2)
Alkaline Phosphatase: 50 U/L (ref 39–117)
BUN: 20 mg/dL (ref 6–23)
CO2: 29 meq/L (ref 19–32)
Calcium: 9.6 mg/dL (ref 8.4–10.5)
Chloride: 98 meq/L (ref 96–112)
Creatinine, Ser: 1.21 mg/dL (ref 0.40–1.50)
GFR: 56.47 mL/min — ABNORMAL LOW (ref >60.00)
Glucose, Bld: 96 mg/dL (ref 70–99)
Potassium: 4.2 meq/L (ref 3.5–5.1)
Sodium: 134 meq/L — ABNORMAL LOW (ref 135–145)
Total Bilirubin: 0.8 mg/dL (ref 0.2–1.2)
Total Protein: 6.9 g/dL (ref 6.0–8.3)

## 2023-10-27 LAB — CBC WITH DIFFERENTIAL/PLATELET
Basophils Absolute: 0 K/uL (ref 0.0–0.1)
Basophils Relative: 0.7 % (ref 0.0–3.0)
Eosinophils Absolute: 0.3 K/uL (ref 0.0–0.7)
Eosinophils Relative: 5.3 % — ABNORMAL HIGH (ref 0.0–5.0)
HCT: 41.4 % (ref 39.0–52.0)
Hemoglobin: 14.2 g/dL (ref 13.0–17.0)
Lymphocytes Relative: 19.4 % (ref 12.0–46.0)
Lymphs Abs: 1.1 K/uL (ref 0.7–4.0)
MCHC: 34.4 g/dL (ref 30.0–36.0)
MCV: 89.8 fl (ref 78.0–100.0)
Monocytes Absolute: 0.6 K/uL (ref 0.1–1.0)
Monocytes Relative: 10.3 % (ref 3.0–12.0)
Neutro Abs: 3.8 K/uL (ref 1.4–7.7)
Neutrophils Relative %: 64.3 % (ref 43.0–77.0)
Platelets: 215 K/uL (ref 150.0–400.0)
RBC: 4.61 Mil/uL (ref 4.22–5.81)
RDW: 12.7 % (ref 11.5–15.5)
WBC: 5.8 K/uL (ref 4.0–10.5)

## 2023-10-27 LAB — LIPASE: Lipase: 39 U/L (ref 11.0–59.0)

## 2023-10-27 LAB — AMYLASE: Amylase: 20 U/L — ABNORMAL LOW (ref 27–131)

## 2023-10-27 NOTE — Patient Instructions (Addendum)
 Consider follow up with gastroenterology though I do not see a lot of other great options from their recent notes.  - with the increase in upper abdominal pain in last 24 hours we also considered CT scan but you wanted to give this another day or two to see if improved first (possibly triggered by pelvic floor physical therapy)- if drastically worsens seek care -also you had pain on lower abdominal exam which has chance of diverticulitis- you wanted to start with labs and reconsider  Please stop by lab before you go If you have mychart- we will send your results within 3 business days of us  receiving them.  If you do not have mychart- we will call you about results within 5 business days of us  receiving them.  *please also note that you will see labs on mychart as soon as they post. I will later go in and write notes on them- will say notes from Dr. Katrinka   Recommended follow up: Return in about 2 months (around 12/27/2023) for followup or sooner if needed.Schedule b4 you leave. If symptoms drastically worsen seek care overnight particularly if fever or worsening pain

## 2023-10-27 NOTE — Progress Notes (Signed)
 Phone [phone] In person visit   Subjective:   Joshua [name] is a 83 y.o. year old very pleasant male patient who presents for/with See problem oriented charting Chief Complaint  Patient presents with   Medical Management of Chronic Issues    Pt complains of stomach pain and not feeling well in general;    Hypertension   Past Medical History-  Patient Active Problem List   Diagnosis Date Noted   Scrotal pain 02/11/2023    Priority: High   Coronary atherosclerosis 10/12/2006    Priority: High   Overactive bladder 11/24/2019    Priority: Medium    Hyperglycemia 10/07/2017    Priority: Medium    Gout 11/20/2016    Priority: Medium    CKD (chronic kidney disease), stage III (HCC) 10/23/2013    Priority: Medium    Neuropathy (HCC) 08/21/2013    Priority: Medium    History of bladder cancer 04/01/2010    Priority: Medium    Essential hypertension 02/21/2007    Priority: Medium    Hyperlipidemia 10/12/2006    Priority: Medium    Asthma 10/12/2006    Priority: Medium    Aortic atherosclerosis (HCC) 05/05/2018    Priority: Low   Senile purpura (HCC) 10/07/2017    Priority: Low   DJD (degenerative joint disease)     Priority: Low   Arthritis     Priority: Low   Neck pain 02/08/2017    Priority: Low   Venous insufficiency 12/29/2016    Priority: Low   Ganglion cyst of tendon sheath of right hand 12/29/2016    Priority: Low   Obesity, Class I, BMI 30-34.9 10/03/2015    Priority: Low   Allergic rhinitis 05/31/2014    Priority: Low   Fatigue 11/09/2013    Priority: Low   Dizziness 08/23/2013    Priority: Low   Chronic bilateral low back pain with left-sided sciatica 10/09/2011    Priority: Low   Localized osteoarthrosis, lower leg 10/04/2009    Priority: Low   Tinnitus 05/05/2007    Priority: Low   OSA (obstructive sleep apnea) 11/02/2006    Priority: Low   GERD 10/12/2006    Priority: Low   De Quervain's tenosynovitis, right 10/28/2022    Priority:  1.   Degenerative arthritis of right knee 06/08/2022    Priority: 1.   Left hamstring injury 04/21/2022    Priority: 1.   Degenerative cervical disc 01/12/2022    Priority: 1.   Greater trochanteric bursitis of right hip 02/17/2021    Priority: 1.   Primary osteoarthritis of left knee 06/18/2020    Priority: 1.   Trigger thumb, left thumb 01/24/2020    Priority: 1.   Greater trochanteric bursitis of left hip 10/27/2018    Priority: 1.   Degenerative disc disease, lumbar 10/27/2018    Priority: 1.   Abdominal muscle strain, initial encounter 06/16/2018    Priority: 1.   Weakness of left leg 06/21/2017    Priority: 1.   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     albuterol  (PROAIR  HFA) 108 (90 Base) MCG/ACT inhaler Inhale 1-2 puffs into the lungs every 6 (six) hours as needed for wheezing or shortness of breath. 18 g 5   Alirocumab  (PRALUENT ) 150 MG/ML SOAJ INJECT 1 DOSE INTO THE SKIN EVERY 14 (FOURTEEN) DAYS. 6 mL 3   amLODipine  (NORVASC ) 5 MG tablet TAKE 1 TABLET (5 MG TOTAL) BY MOUTH DAILY. 90 tablet 3   aspirin  EC 81 MG tablet Take 1 tablet (81 mg total) by mouth 2 (two) times daily after a meal. 30 tablet 11   cholecalciferol (VITAMIN D3) 25 MCG (1000 UNIT) tablet Take 1,000 Units by mouth daily.     Cyanocobalamin  (B-12) 2500 MCG TABS Take 1,250 mcg by mouth daily.     diazepam  (VALIUM ) 5 MG tablet TAKE 1 TABLET (5 MG TOTAL) BY MOUTH AT  BEDTIME AS NEEDED (SLEEP). 30 tablet 0   EPINEPHrine  0.3 mg/0.3 mL IJ SOAJ injection Inject 0.3 mg into the muscle as needed for anaphylaxis. for allergic reaction     ezetimibe  (ZETIA ) 10 MG tablet TAKE 1 TABLET BY MOUTH EVERY DAY 90 tablet 2   folic acid  (FOLVITE ) 800 MCG tablet Take 800 mcg by mouth daily.      gabapentin  (NEURONTIN ) 300 MG capsule Take 2 cap in AM, 1 cap at noon, 3 caps at bedtime 540 capsule 3   hydrochlorothiazide  (HYDRODIURIL ) 25 MG tablet TAKE 1 TABLET BY MOUTH EVERY DAY 90 tablet 2   ipratropium (ATROVENT ) 0.03 % nasal spray Place 2 sprays into both nostrils 3 (three) times daily as needed for rhinitis. 30 mL 12   ketoconazole (NIZORAL) 2 % cream Apply 1 Application topically 2 (two) times daily as needed.     levothyroxine  (SYNTHROID ) 25 MCG tablet TAKE 1 TABLET BY MOUTH DAILY BEFORE BREAKFAST. 30 tablet 1   Loratadine  (CLARITIN  PO) Take by mouth at bedtime.     lubiprostone  (AMITIZA ) 8 MCG capsule TAKE 1 CAPSULE BY MOUTH 2 TIMES DAILY WITH A MEAL.(GOOD RX CHEAPER) Oral; Duration: 90 Days     MYRBETRIQ  50 MG TB24 tablet TAKE 1 TABLET BY MOUTH EVERY DAY (REPLACES GEMTESA ) 90 tablet 1   nitroGLYCERIN  (NITROSTAT ) 0.4 MG SL tablet Place 1 tablet (0.4 mg total) under the tongue every 5 (five) minutes as needed for chest pain. 30 tablet 5   pantoprazole  (PROTONIX ) 40 MG tablet TAKE 1 TABLET BY MOUTH EVERY DAY 90 tablet 3   polyethylene glycol powder (GLYCOLAX /MIRALAX ) powder Take 17 g by mouth daily. 255 g 0   valsartan  (DIOVAN ) 320 MG tablet TAKE 1 TABLET BY MOUTH EVERY DAY 90 tablet 3   No current facility-administered medications for this visit.     Objective:  BP 132/72 (BP Location: Left Arm, Patient Position: Sitting, Cuff Size: Normal)   Pulse 70   Temp (!) 97.2 F (36.2 C) (Temporal)   Ht 5' 6 (1.676 m)   Wt 201 lb 6.4 oz (91.4 kg)   SpO2 95%   BMI 32.51 kg/m  Gen: NAD, resting comfortably CV: RRR no murmurs rubs or gallops Lungs: CTAB no crackles,  wheeze, rhonchi Abdomen: soft/tender mildly in epigastric area and into the left upper quadrant but moderate tenderness in left lower quadrant /nondistended/active bowel sounds. No rebound or guarding.  Ext: no edema Skin: warm, dry     Assessment and Plan   #social update- brother needs high risk abdominal surgery on 19th- tough on him. 16  years younger than him- lives in [place]  # IBS- since age 55 but also with lower abdominal pain not improved by bowel movement and some upper abdominal pain Has worked with gastroenterology most recently 08/23/23- lubiprostone  8 mcg helping with constipation but getting a lot of cramps. More loose stools but more tolerable than the constipation.  -they were going to trial moegrity (but didn't tolerate-upper abdominal pain) as trulance not cvered- motegrity  not covered  Did have visit with pelvic floor physical therapy and they mentioned he had pain in one area with concern for diverticulitis. His pain is in upper abdomen though. Last scan in December. Pain started last night in upper abdomen but bowels have not felt rigth in months. Remains on pantoprazole  40 mg.  - also left lower quadrant pain for a few weeks and noted this on exam A/P: Patient with mild epigastric abdominal pain today-update lab work.  Also with some left lower quadrant pain and we discussed possibility of diverticulitis.  I offered CT scan today but he declined for now and wanted to see how labs looked first-if pains fail to improve within 24 to 48 hours or if labs are concerning he agrees to move forward with CT -Acute worsening pain he should seek emergency department -Also get urinalysis and urine culture with lower abdominal pain though doubt UTI  #CAD/hyperlipidemia-history of CABG x5- follows with Dr. Lavona S: Meds: asa 81mg , praluent  -Fatigue on Repatha  in 2021-restarted on Zetia  10 mg p.o. as well but later placed back on praluent  150 mg q2 weeks -Stent drug eluting placed  04/13/19 Dr. Verlin Lab Results  Component Value Date   CHOL 117 08/12/2023   HDL 45 08/12/2023   LDLCALC 45 08/12/2023   LDLDIRECT 115.0 06/04/2016   TRIG 161 (H) 08/12/2023   CHOLHDL 2.6 08/12/2023    A/P: CAD largely asymptomatic with aspirin  and Praluent  as well as Zetia .  Continue current medication lipids have been at goal-continue current medication   #Hypertension/CKD stage III S: Compliant with amlodipine  5 mg, hydrochlorothiazide  25 mg (trial of half tablet April 2025 and he reports doing well with this) and valsartan  320mg  -GFR has been stable.  On ARB in case proteinuric element A/P: Blood pressure well-controlled-continue current medication-half dose seems to be working well for him of hydrochlorothiazide .  CKD stage III-GFR is been stable in the 50s-update with labs today  % Neuropathy-follows with Dr. Laverle on gabapentin  and finds helpful but imperfect control  #Hyperglycemia history of mildly elevated A1c.  Prediabetes range.  Improved on last labs and too soon for repeat today Lab Results  Component Value Date   HGBA1C 5.8 05/25/2023   HGBA1C 5.9 08/19/2022   HGBA1C 6.1 03/25/2022    #OAB- mybetriq some help -Gemtessa  helped but stopped working later  -Chronic pelvic pain improving with pelvic floor physical therapy  Recommended follow up: Return in about 2 months (around 12/27/2023) for followup or sooner if needed.Schedule b4 you leave. Future Appointments  Date Time Provider Department Center  11/01/2023 10:00 AM MSD-SLEEP TECHNOLOGIST [facility]  11/30/2023  9:30 AM Cheryl Reusing, FNP [facility] None  12/06/2023  9:15 AM Emelia Josefa HERO, NP [facility]  12/22/2023  9:15 AM Claudene Arthea HERO, DO [facility] None  04/27/2024 10:00 AM Georjean Darice HERO, MD [facility] None  06/15/2024 10:00 AM [facility] ANNUAL WELLNESS VISIT 1 [facility]   Lab/Order associations:   ICD-10-CM   1. Upper abdominal pain  R10.10 Comprehensive metabolic panel with GFR    CBC  with  Differential/Platelet    Lipase    Amylase    2. LLQ abdominal pain  R10.32 Urinalysis, Routine w reflex microscopic    Urine Culture    3. Atherosclerosis of native coronary artery of native heart without angina pectoris  I25.10     4. Stage 3 chronic kidney disease, unspecified whether stage 3a or 3b CKD (HCC)  N18.30     5. Essential hypertension  I10       No orders of the defined types were placed in this encounter.   Return precautions advised.  Garnette Lukes, MD

## 2023-10-28 ENCOUNTER — Telehealth: Payer: Self-pay | Admitting: Family Medicine

## 2023-10-28 ENCOUNTER — Ambulatory Visit: Payer: Self-pay | Admitting: Family Medicine

## 2023-10-28 ENCOUNTER — Telehealth: Payer: Self-pay | Admitting: *Deleted

## 2023-10-28 ENCOUNTER — Ambulatory Visit (HOSPITAL_COMMUNITY)
Admission: RE | Admit: 2023-10-28 | Discharge: 2023-10-28 | Disposition: A | Discharge status: Home / Self Care | Source: Ambulatory Visit | Attending: Family Medicine | Admitting: Family Medicine

## 2023-10-28 DIAGNOSIS — R1032 Left lower quadrant pain: Secondary | ICD-10-CM | POA: Insufficient documentation

## 2023-10-28 DIAGNOSIS — K4020 Bilateral inguinal hernia, without obstruction or gangrene, not specified as recurrent: Secondary | ICD-10-CM | POA: Diagnosis not present

## 2023-10-28 DIAGNOSIS — R1013 Epigastric pain: Secondary | ICD-10-CM | POA: Diagnosis not present

## 2023-10-28 LAB — URINE CULTURE
MICRO NUMBER:: 16794935
Result:: NO GROWTH
SPECIMEN QUALITY:: ADEQUATE

## 2023-10-28 MED ORDER — IOHEXOL 300 MG/ML  SOLN
100.0000 mL | Freq: Once | INTRAMUSCULAR | Status: AC | PRN
  Administered 2023-10-28: 100 mL via INTRAVENOUS

## 2023-10-28 MED ORDER — IOHEXOL 9 MG/ML PO SOLN
ORAL | Status: AC
  Filled 2023-10-28: qty 1000

## 2023-10-28 NOTE — Telephone Encounter (Signed)
 Arrive at 3:15pm for 3:30pm appt today 10/28/23.  [facility], main entrance, check in at Radiology Remain NPO   Spoke with patient wife and she not sure the patient can do the appt for today, he is not home. She will call Dr San office back if not able to do the appt.  if unable to keep appt scheduled today he can call [phone] to reschedule

## 2023-10-28 NOTE — Telephone Encounter (Signed)
 Arrive at 3:15pm for 3:30pm appt today 10/28/23.  [facility], main entrance, check in at Radiology Remain NPO   Spoke with patient wife and she not sure the patient can do the appt for today, he is not home. She will call Dr San office back if not able to do the appt.  if unable to keep appt scheduled today he can call [phone] to reschedule **We still need a call report number to put on this appt. Please advise, thanks!   Per Stat chat.

## 2023-10-28 NOTE — Telephone Encounter (Signed)
 Message sent to STAT chat for authorization and scheduling

## 2023-10-28 NOTE — Telephone Encounter (Signed)
 Copied from CRM [id number]. Topic: Clinical - Medical Advice >> Oct 28, 2023  8:41 AM Laymon HERO wrote: Reason for CRM: Patient calling back to report how he is feeling- he said his stomach is still hurting him- wanting to get a CT scan of his stomach   Please advise  [facility]

## 2023-10-28 NOTE — Telephone Encounter (Signed)
 Please see patient message and advise on need for CT scan.    Copied from CRM [id number]. Topic: Clinical - Medical Advice >> Oct 28, 2023  8:41 AM Laymon HERO wrote: Reason for CRM: Patient calling back to report how he is feeling- he said his stomach is still hurting him- wanting to get a CT scan of his stomach

## 2023-10-28 NOTE — Telephone Encounter (Signed)
 See other note on same issue.

## 2023-10-28 NOTE — Telephone Encounter (Signed)
 Ordered as stat for him- please let stat chat know

## 2023-10-28 NOTE — Addendum Note (Signed)
 Addended by: KATRINKA GARNETTE KIDD on: 10/28/2023 12:16 PM   Modules accepted: Orders

## 2023-10-29 ENCOUNTER — Telehealth: Payer: Self-pay | Admitting: Gastroenterology

## 2023-10-29 NOTE — Telephone Encounter (Signed)
 PT is requesting to speak to a nurse regarding his stomach cramps and irregular constipation. Please advise.

## 2023-10-29 NOTE — Telephone Encounter (Signed)
 Patient states that he was given lubipristone by [facility] several months ago but has recently discontinued it since it was ineffective for him. States he also d/c dicyclomine  because it made him have worsening cramping. States he was never able to get motegrity  due to cost. He has now switched back to MIralax  daily and currently feels better than I have in months.  Patient requests appointment for further management. He has been scheduled with Ellouise Console, PA-C on 11/17/23.

## 2023-11-01 ENCOUNTER — Ambulatory Visit (HOSPITAL_BASED_OUTPATIENT_CLINIC_OR_DEPARTMENT_OTHER): Attending: Primary Care | Admitting: Radiology

## 2023-11-01 DIAGNOSIS — G4733 Obstructive sleep apnea (adult) (pediatric): Secondary | ICD-10-CM

## 2023-11-09 DIAGNOSIS — J4530 Mild persistent asthma, uncomplicated: Secondary | ICD-10-CM | POA: Diagnosis not present

## 2023-11-09 DIAGNOSIS — J3089 Other allergic rhinitis: Secondary | ICD-10-CM | POA: Diagnosis not present

## 2023-11-09 DIAGNOSIS — J3081 Allergic rhinitis due to animal (cat) (dog) hair and dander: Secondary | ICD-10-CM | POA: Diagnosis not present

## 2023-11-09 DIAGNOSIS — J301 Allergic rhinitis due to pollen: Secondary | ICD-10-CM | POA: Diagnosis not present

## 2023-11-10 ENCOUNTER — Telehealth: Payer: Self-pay | Admitting: Primary Care

## 2023-11-10 ENCOUNTER — Other Ambulatory Visit: Payer: Self-pay | Admitting: Family Medicine

## 2023-11-10 DIAGNOSIS — K5900 Constipation, unspecified: Secondary | ICD-10-CM | POA: Diagnosis not present

## 2023-11-10 DIAGNOSIS — M6281 Muscle weakness (generalized): Secondary | ICD-10-CM | POA: Diagnosis not present

## 2023-11-10 DIAGNOSIS — M62838 Other muscle spasm: Secondary | ICD-10-CM | POA: Diagnosis not present

## 2023-11-10 DIAGNOSIS — R109 Unspecified abdominal pain: Secondary | ICD-10-CM | POA: Diagnosis not present

## 2023-11-10 NOTE — Telephone Encounter (Signed)
 Mask fitting completed, see below   thor: [name], FNP Service: -- Author Type: Family Nurse Practitioner  Filed: 10/19/2023  5:00 PM Encounter Date: 09/03/2020 Status: Signed  Editor: [name], FNP (Family Nurse Practitioner)  The patient brought his mask from home; ResMed F20 Airtouch, size Medium. Standard headgear. His mask is a proper fit. In the lab we tried two different style masks; Resmed Airfit F30 (nasal-oral) size medium cushion. Pt liked it except for the part on his upper lip and nares. Said it hurt after <5 min. Next, tried the ResMed Airfit N20 nasal mask, size medium. Again, too much pressure on upper lip and gums. I explained we would have to go back to the full face style and he wanted to keep the foam cushion he has. He states his main issue is leak during parts of the night and he doesn't want the red frowny face. I suggested we put a new headgear on his mask incase it was stretched. His cushion was newly changed. He tried it on/ tech adjusted mask while supine for 5 min. Pressure range tested 5 cm/H2o- 14 cm/H2O. with some adjustment, and testing with jaw drop; we managed to maintain a leak of 0-3. Patient will reach out to his provider if he continues to have issues.

## 2023-11-10 NOTE — Progress Notes (Signed)
 Mask fitting completed, see below   thor: [name], FNP Service: -- Author Type: Family Nurse Practitioner  Filed: 10/19/2023  5:00 PM Encounter Date: 09/03/2020 Status: Signed  Editor: [name], FNP (Family Nurse Practitioner)  The patient brought his mask from home; ResMed F20 Airtouch, size Medium. Standard headgear. His mask is a proper fit. In the lab we tried two different style masks; Resmed Airfit F30 (nasal-oral) size medium cushion. Pt liked it except for the part on his upper lip and nares. Said it hurt after <5 min. Next, tried the ResMed Airfit N20 nasal mask, size medium. Again, too much pressure on upper lip and gums. I explained we would have to go back to the full face style and he wanted to keep the foam cushion he has. He states his main issue is leak during parts of the night and he doesn't want the red frowny face. I suggested we put a new headgear on his mask incase it was stretched. His cushion was newly changed. He tried it on/ tech adjusted mask while supine for 5 min. Pressure range tested 5 cm/H2o- 14 cm/H2O. with some adjustment, and testing with jaw drop; we managed to maintain a leak of 0-3. Patient will reach out to his provider if he continues to have issues.

## 2023-11-15 ENCOUNTER — Ambulatory Visit: Admitting: Internal Medicine

## 2023-11-17 ENCOUNTER — Ambulatory Visit: Admitting: Physician Assistant

## 2023-11-29 ENCOUNTER — Other Ambulatory Visit: Payer: Self-pay | Admitting: Interventional Radiology

## 2023-11-29 DIAGNOSIS — M25562 Pain in left knee: Secondary | ICD-10-CM

## 2023-11-30 ENCOUNTER — Ambulatory Visit: Admitting: Family

## 2023-11-30 DIAGNOSIS — H25811 Combined forms of age-related cataract, right eye: Secondary | ICD-10-CM | POA: Diagnosis not present

## 2023-11-30 DIAGNOSIS — H2511 Age-related nuclear cataract, right eye: Secondary | ICD-10-CM | POA: Diagnosis not present

## 2023-11-30 DIAGNOSIS — Z961 Presence of intraocular lens: Secondary | ICD-10-CM | POA: Diagnosis not present

## 2023-11-30 DIAGNOSIS — H25011 Cortical age-related cataract, right eye: Secondary | ICD-10-CM | POA: Diagnosis not present

## 2023-12-02 ENCOUNTER — Ambulatory Visit: Admitting: Cardiology
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
## 2023-12-06 ENCOUNTER — Encounter: Payer: Self-pay | Admitting: General Practice

## 2023-12-06 ENCOUNTER — Ambulatory Visit: Attending: General Practice | Admitting: General Practice

## 2023-12-06 VITALS — BP 106/62 | HR 79 | Ht 66.0 in | Wt 199.8 lb

## 2023-12-06 DIAGNOSIS — I2510 Atherosclerotic heart disease of native coronary artery without angina pectoris: Secondary | ICD-10-CM | POA: Diagnosis not present

## 2023-12-06 DIAGNOSIS — I10 Essential (primary) hypertension: Secondary | ICD-10-CM | POA: Diagnosis not present

## 2023-12-06 DIAGNOSIS — E785 Hyperlipidemia, unspecified: Secondary | ICD-10-CM

## 2023-12-06 NOTE — Patient Instructions (Signed)
 Medication Instructions:  Your physician recommends that you continue on your current medications as directed. Please refer to the Current Medication list given to you today.  *If you need a refill on your cardiac medications before your next appointment, please call your pharmacy*  Lab Work: NONE If you have labs (blood work) drawn today and your tests are completely normal, you will receive your results only by: MyChart Message (if you have MyChart) OR A paper copy in the mail If you have any lab test that is abnormal or we need to change your treatment, we will call you to review the results.  Testing/Procedures: NONE  Follow-Up: At [facility], you and your health needs are our priority.  As part of our continuing mission to provide you with exceptional heart care, our providers are all part of one team.  This team includes your primary Cardiologist (physician) and Advanced Practice Providers or APPs (Physician Assistants and Nurse Practitioners) who all work together to provide you with the care you need, when you need it.  Your next appointment:   6 month(s)  Provider:   Lynwood Schilling, MD or Josefa Beauvais, NP       We recommend signing up for the patient portal called MyChart.  Sign up information is provided on this After Visit Summary.  MyChart is used to connect with patients for Virtual Visits (Telemedicine).  Patients are able to view lab/test results, encounter notes, upcoming appointments, etc.  Non-urgent messages can be sent to your provider as well.   To learn more about what you can do with MyChart, go to [email or web address].   Other Instructions Exercise regularly as told by your doctor. Make sure to weight daily and keep a weight log.   Moderate-intensity exercise is any activity that gets you moving enough to burn at least three times more energy (calories) than if you were sitting. Examples of moderate exercise include: Walking a mile in 15  minutes. Doing light yard work. Biking at an easy pace. Most people should get at least 150 minutes of moderate-intensity exercise a week to maintain their body weight.  Increase your water intake: Maintain hydration

## 2023-12-08 ENCOUNTER — Encounter: Payer: Self-pay | Admitting: Family Medicine

## 2023-12-08 ENCOUNTER — Telehealth: Payer: Self-pay | Admitting: General Practice

## 2023-12-08 ENCOUNTER — Ambulatory Visit (INDEPENDENT_AMBULATORY_CARE_PROVIDER_SITE_OTHER): Admitting: Family Medicine

## 2023-12-08 VITALS — BP 122/62 | HR 48 | Ht 66.0 in | Wt 198.0 lb

## 2023-12-08 DIAGNOSIS — G4733 Obstructive sleep apnea (adult) (pediatric): Secondary | ICD-10-CM | POA: Diagnosis not present

## 2023-12-08 DIAGNOSIS — R001 Bradycardia, unspecified: Secondary | ICD-10-CM | POA: Diagnosis not present

## 2023-12-08 DIAGNOSIS — M545 Low back pain, unspecified: Secondary | ICD-10-CM

## 2023-12-08 DIAGNOSIS — G8929 Other chronic pain: Secondary | ICD-10-CM | POA: Diagnosis not present

## 2023-12-08 NOTE — Progress Notes (Signed)
   I, Leotis Batter, CMA acting as a scribe for Artist Lloyd, MD.  Joshua [name] is a 83 y.o. male who presents to [facility] at [facility] today for cont'd back pain. Pt was last seen by Dr. Claudene on 10/21/23 and labs were obtained.  Today, pt reports exacerbation of bilat lower back sx, R>L. Sx started after lifting a heavy suitcase. Notes weakness and tingling in the legs. Weakness worse in the mornings. Improved some with massage. Having trouble getting into and out of bed. Taking pain meds prn.   Dx testing: 05/24/21 L-spine MRI  03/28/21 L-spine XR  Pertinent review of systems: No fevers or chills.  No chest pain or palpitations.  Shortness of breath.  Relevant historical information: Heart disease.   Exam:  BP 122/62   Pulse (!) 48   Ht 5' 6 (1.676 m)   Wt 198 lb (89.8 kg)   SpO2 97%   BMI 31.96 kg/m  General: Well Developed, well nourished, and in no acute distress.   MSK: L-spine: Normal-appearing Nontender palpation spinal midline. Decreased lumbar motion. Lower extremity strength is intact.  Heart: Bradycardia with a regular rhythm.  No murmurs rubs or gallops auscultated. Lungs clear to auscultation bilaterally.    Lab and Radiology Results  CT scan images lumbar spine visible on CT scan abdomen and pelvis from August 2025 personally and independently interpreted.  Degenerative changes are present in the lower portion lumbar spine but no acute fractures are visible.  Twelve-lead EKG: Sinus rhythm with frequent PVCs almost bigeminy with a rate of 71.  Irregular.  No ST segment elevation or depression.   Assessment and Plan: 83 y.o. male with chronic low back pain with recent exacerbation.  Patient does have degenerative changes visible on recent CT scan.  Pain due to muscle spasm and dysfunction.  Plan to refer to PT and recommend heating pad.  If not improving recheck.  Patient incidentally was found to have a palpated and auscultated heart  rate of about 30 to 40 bpm with irregularity.  EKG was obtained and compared to prior EKGs which did show frequent PVCs.  I did discuss his situation with his cardiology team.  As he is not symptomatic at this time he will be seen in cardiology clinic in the near future.  He knows to go to the emergency room if his symptoms worsen.   PDMP not reviewed this encounter. Orders Placed This Encounter  Procedures   Ambulatory referral to Physical Therapy    Referral Priority:   Routine    Referral Type:   Physical Medicine    Referral Reason:   Specialty Services Required    Requested Specialty:   Physical Therapy    Number of Visits Requested:   1   EKG 12-Lead   No orders of the defined types were placed in this encounter.    Discussed warning signs or symptoms. Please see discharge instructions. Patient expresses understanding.   The above documentation has been reviewed and is accurate and complete Artist Lloyd, M.D.

## 2023-12-08 NOTE — Telephone Encounter (Signed)
 Provider contacted Dr. Lavona tonight via secure chat.  He noted he was seeing patient and patient was having heart rates in the 30-40 range.  He requested follow-up visit.  He was recently seen by me in clinic and denied lightheadedness, presyncope and syncope.  His pulse at that time was 79 bpm and his blood pressure was 106/62.  Please contact scheduling to set up appointment for Mr. Edwinna.  Thank you for your help.  Josefa HERO. Ronne Savoia NP-C     12/08/2023, 3:11 PM [facility] [address] [place], [place] [address] Office [phone]

## 2023-12-08 NOTE — Patient Instructions (Addendum)
 Thank you for coming in today.   I've referred you to Physical Therapy here, at this office.  You will hear from our office soon about scheduling, once we check with your insurance company.   Try using a heating pad  Call cardiology today and get an appointment.

## 2023-12-09 DIAGNOSIS — R102 Pelvic and perineal pain: Secondary | ICD-10-CM | POA: Diagnosis not present

## 2023-12-09 DIAGNOSIS — Z8546 Personal history of malignant neoplasm of prostate: Secondary | ICD-10-CM | POA: Diagnosis not present

## 2023-12-09 DIAGNOSIS — N3941 Urge incontinence: Secondary | ICD-10-CM | POA: Diagnosis not present

## 2023-12-10 ENCOUNTER — Telehealth

## 2023-12-10 NOTE — Progress Notes (Signed)
 This encounter was conducted via the [employer] providing interactive audio and visual communication.  The patient provided verbal consent to conduct a virtual appointment.  The patient was located at their primary residence during this encounter.  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 He underwent a technically successful left geniculate artery embolization 03/08/23. His pain significantly improved after the procedure.   He fell after tripping on his rug several weeks ago and has had persistent left knee pain since then.  This is a similar pain that prior the GAE.  It started acutely after the fall.  It is mostly located in the anterior, prepatellar knee.  No bruising or swelling reported.  He can still ambulate.  Imaging obtained was negative for acute findings and he was able to ambulate. His ROM was not significantly changed. I reassured the patient with these findings and encouraged conservative measures. I expressed optimism that his pain would go away and he would feel like he did post-GAE. We made plans to follow up in 6 months.   He presents today via virtual video visit for follow up. He was recently evaluated by Dr. Lloyd for chronic low back pain. He has been in close contact with his cardiology team for fatigue and bradycardia.   He reports excellent response to the left knee with near complete resolution of his pain, decreased to a 1/10 over the past several weeks. His main complaint now is his right knee, which is very painful, averaging a 7/10.  He has to ice the knee frequently.  The pain prevents him from  sleeping.  He is limited on pain medications he can take due to comorbidities.  He wants to avoid surgery on the right knee.   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   Blood transfusion without reported diagnosis 11/1999   CAD (coronary artery disease)    CABG 2001   Depression    Diverticulosis    DJD (degenerative joint disease)    BACK   GERD (gastroesophageal reflux disease)    H/O inguinal hernia repair 12/2018   History of gout    Hyperlipidemia    Hypertension    IBS (irritable bowel syndrome)    Obesity    OSA (obstructive sleep apnea)    USES CPAP    Pancreatitis ~ 1980   Peripheral vascular disease (HCC)    Pre-diabetes    Prostate cancer (HCC) 04/2010   Sleep apnea     Past Surgical History:  Procedure Laterality Date   ANTERIOR CERVICAL DECOMP/DISCECTOMY FUSION  05/26/2006   PLATE PLACED   BACK SURGERY     CARDIAC CATHETERIZATION  11/20/1999   CORONARY ARTERY BYPASS GRAFT  11/20/1999   SVG-RI1-RI2, SVG-OM, SVG-dRCA   CORONARY STENT INTERVENTION N/A 04/13/2019   Procedure: CORONARY STENT INTERVENTION;  Surgeon: Verlin Lonni BIRCH, MD;  Location: [facility];  Service: Cardiovascular;  Laterality: N/A;   HERNIA REPAIR     INGUINAL HERNIA REPAIR Left 01/02/2019   Procedure: OPEN LEFT INGUINAL HERNIA REPAIR WITH MESH;  Surgeon: Gladis Cough, MD;  Location: [facility];  Service: General;  Laterality: Left;   IR EMBO ARTERIAL NOT HEMORR HEMANG INC GUIDE ROADMAPPING  03/08/2023   IR RADIOLOGIST EVAL & MGMT  02/05/2023   IR RADIOLOGIST EVAL & MGMT  04/05/2023   IR RADIOLOGIST EVAL & MGMT  04/09/2023   IR RADIOLOGIST EVAL & MGMT  04/30/2023   JOINT REPLACEMENT  Left knee   KNEE ARTHROSCOPY Right 1992; 04/2008   LAPAROSCOPIC CHOLECYSTECTOMY   2007   LEFT HEART CATH AND CORS/GRAFTS ANGIOGRAPHY N/A 04/13/2019   Procedure: LEFT HEART CATH AND CORS/GRAFTS ANGIOGRAPHY;  Surgeon: Verlin Lonni BIRCH, MD;  Location: [facility];  Service: Cardiovascular;  Laterality: N/A;   NASAL SEPTUM SURGERY Right 2007   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   ROBOT ASSISTED LAPAROSCOPIC RADICAL PROSTATECTOMY  04/2010   SPINE SURGERY     THYROIDECTOMY, PARTIAL  1980   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   Procedure: LEFT TOTAL KNEE ARTHROPLASTY;  Surgeon: Sheril Coy, MD;  Location: [facility];  Service: Orthopedics;  Laterality: Left;    Allergies: Codeine, Lexapro  [escitalopram ], Bromfed, Clarithromycin, Cymbalta  [duloxetine  hcl], Doxycycline hyclate, Modafinil, Oxycodone -acetaminophen , Promethazine hcl, Quinolones, Telithromycin, Tramadol , Benadryl [diphenhydramine], Hydrocodone -acetaminophen , Repatha  [evolocumab ], Statins, and Sulfonamide derivatives  Medications: Prior to Admission medications   Medication Sig Start Date End Date Taking? Authorizing Provider  acetaminophen  (TYLENOL ) 500 MG tablet Take 1,000 mg by mouth every 6 (six) hours as needed for moderate pain or headache.    [provider]  albuterol  (PROAIR  HFA) 108 (90 Base) MCG/ACT inhaler Inhale 1-2 puffs into the lungs every 6 (six) hours as needed for wheezing or shortness of breath. 12/06/21   Lucius Krabbe, NP  Alirocumab  (PRALUENT ) 150 MG/ML SOAJ INJECT 1 DOSE INTO THE SKIN EVERY 14 (FOURTEEN) DAYS. 12/08/22   Hilty, Vinie BROCKS, MD  amLODipine  (NORVASC ) 5 MG tablet TAKE 1 TABLET (5 MG TOTAL) BY MOUTH DAILY. 02/03/23   Mona Vinie BROCKS, MD  aspirin  EC 81 MG tablet Take 1 tablet (81 mg total) by mouth 2 (two) times daily after a meal. 06/18/20   Lenis Barter, PA-C  cholecalciferol (VITAMIN D3) 25 MCG (1000 UNIT) tablet Take 1,000 Units by mouth daily.    [provider]  Cyanocobalamin  (B-12) 2500 MCG TABS Take 1,250 mcg by mouth daily.    [provider]  diazepam  (VALIUM ) 5 MG tablet TAKE 1 TABLET (5 MG TOTAL) BY MOUTH AT BEDTIME AS NEEDED (SLEEP). 04/16/23   Katrinka Garnette KIDD, MD  EPINEPHrine  0.3 mg/0.3 mL IJ SOAJ injection Inject 0.3 mg into the muscle as needed for anaphylaxis. for allergic reaction 04/24/19   [provider]  ezetimibe  (ZETIA ) 10 MG tablet TAKE 1 TABLET BY MOUTH EVERY DAY 10/04/23   Lavona Agent, MD  fluticasone  (FLONASE ) 50 MCG/ACT nasal spray Place 1 spray into both nostrils 2 (two) times daily. 11/09/23   [provider]  folic acid  (FOLVITE ) 800 MCG tablet Take 800 mcg by mouth daily.     [provider]  gabapentin  (NEURONTIN ) 300 MG capsule Take 2 cap in AM, 1 cap at noon, 3 caps at bedtime 05/26/23   Georjean Darice HERO, MD  hydrochlorothiazide  (HYDRODIURIL ) 25 MG tablet TAKE 1 TABLET BY MOUTH EVERY DAY 10/04/23   Katrinka Garnette KIDD, MD  ketoconazole (NIZORAL) 2 % cream Apply 1  Application topically 2 (two) times daily as needed. 01/25/23   [provider]  levothyroxine  (SYNTHROID ) 25 MCG tablet TAKE 1 TABLET BY MOUTH DAILY BEFORE BREAKFAST. 11/10/23   Claudene Arthea HERO, DO  Loratadine  (CLARITIN  PO) Take by mouth at bedtime.    [provider]  MYRBETRIQ  50 MG TB24 tablet TAKE 1 TABLET BY MOUTH EVERY DAY (REPLACES GEMTESA ) 08/30/23   Katrinka Garnette KIDD, MD  nitroGLYCERIN  (NITROSTAT ) 0.4 MG SL tablet Place 1 tablet (0.4 mg total) under the tongue every 5 (five) minutes as needed for chest pain. 12/11/22   Katrinka Garnette KIDD, MD  pantoprazole  (PROTONIX ) 40 MG tablet TAKE 1 TABLET BY MOUTH EVERY DAY 04/12/23   Hilty, Vinie BROCKS, MD  polyethylene glycol powder (GLYCOLAX /MIRALAX ) powder Take 17 g by mouth daily. 08/29/14   Lynwood Anes, MD  valsartan  (DIOVAN ) 320 MG tablet TAKE 1 TABLET BY MOUTH EVERY DAY 06/25/23   Katrinka Garnette KIDD, MD     Family History  Problem Relation Age of Onset   Heart disease Mother    Hyperlipidemia Mother    COPD Mother    Heart disease Father     Hyperlipidemia Father    Alcohol abuse Father    Obesity Father    Diabetes Brother    Other Brother        accidental injury leading to long term GI complications- seerious surgeyr 2025 planned   Dementia Brother        incontinence   Colon cancer Neg Hx    Pancreatic cancer Neg Hx    Esophageal cancer Neg Hx    Stomach cancer Neg Hx    Liver cancer Neg Hx     Social History   Socioeconomic History   Marital status: Married    Spouse name: Not on file   Number of children: Not on file   Years of education: Not on file   Highest education level: Associate degree: occupational, Scientist, product/process development, or vocational program  Occupational History   Occupation: retired    Associate Professor: RETIRED  Tobacco Use   Smoking status: Former    Current packs/day: 0.00    Average packs/day: 1 pack/day for 35.0 years (35.0 ttl pk-yrs)    Types: Cigarettes    Start date: 06/16/1957    Quit date: 06/16/1992    Years since quitting: 31.5    Passive exposure: Past   Smokeless tobacco: Never  Vaping Use   Vaping status: Never Used  Substance and Sexual Activity   Alcohol use: Yes    Alcohol/week: 2.0 standard drinks of alcohol    Types: 2 Shots of liquor per week    Comment: occ   Drug use: No   Sexual activity: Not Currently    Partners: Female  Other Topics Concern   Not on file  Social History Narrative   Married 42 years in 2015. 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      Hobbies: poker, movies and tv, staying active      Right handed   2 level home with steps he uses   Social Drivers of Corporate investment banker Strain: Low Risk  (10/25/2023)   Overall Financial Resource Strain (CARDIA)    Difficulty of Paying Living Expenses: Not hard at all  Food Insecurity: No Food Insecurity (10/25/2023)   Hunger Vital Sign    Worried About Running Out of Food  in the Last Year: Never true    Ran Out of Food in the Last Year: Never  true  Transportation Needs: No Transportation Needs (10/25/2023)   PRAPARE - Administrator, Civil Service (Medical): No    Lack of Transportation (Non-Medical): No  Physical Activity: Inactive (10/25/2023)   Exercise Vital Sign    Days of Exercise per Week: 0 days    Minutes of Exercise per Session: Not on file  Stress: No Stress Concern Present (10/25/2023)   [employer] of Occupational Health - Occupational Stress Questionnaire    Feeling of Stress: Only a little  Social Connections: Moderately Isolated (10/25/2023)   Social Connection and Isolation Panel    Frequency of Communication with Friends and Family: More than three times a week    Frequency of Social Gatherings with Friends and Family: Three times a week    Attends Religious Services: Never    Active Member of Clubs or Organizations: No    Attends Banker Meetings: Not on file    Marital Status: Married     Vital Signs: There were no vitals taken for this visit.  Physical Exam  Patient is alert, oriented and able to participate fully in the conversation. No apparent discomfort or distress observed. He appears appropriately dressed.    Imaging: Left knee 04/30/23   Left GAE 03/08/23    Right knee 06/25/22  Kellgren and Lawrence Grade II    Labs:  CBC: Recent Labs    03/18/23 0725 05/25/23 0954 07/10/23 1254 10/27/23 1233  WBC 11.6* 7.9 7.9 5.8  HGB 13.9 14.5 14.9 14.2  HCT 39.6 42.0 41.8 41.4  PLT 181 256.0 207 215.0    COAGS: No results for input(s): INR, APTT in the last 8760 hours.  BMP: Recent Labs    03/18/23 0725 05/25/23 0954 07/10/23 1254 10/27/23 1233  NA 134* 138 139 134*  K 3.5 3.7 3.9 4.2  CL 99 102 104 98  CO2 24 28 26 29   GLUCOSE 154* 96 89 96  BUN 16 22 17 20   CALCIUM  9.0 10.0 9.6 9.6  CREATININE 1.31* 1.24 1.24 1.21  GFRNONAA 55*  --  59*  --     LIVER FUNCTION TESTS: Recent Labs    03/18/23 0725 05/25/23 0954 07/10/23 1254  10/27/23 1233  BILITOT 1.2* 0.7 0.7 0.8  AST 20 18 21 15   ALT 21 19 21 17   ALKPHOS 48 51 58 50  PROT 6.2* 6.7 6.6 6.9  ALBUMIN 3.2* 4.0 3.6 4.3    Assessment and Plan: 83 year old male with a history of bilateral knee osteoarthritis status post left total knee arthoplasty in 2022 with persistent, worsening pain since the operation [facility] 57/96). He attempted conservative measures without any significant benefit. He also has some moderate right knee pain secondary to osteoarthritis. He underwent a technically successful left geniculate artery embolization 03/08/23 and experienced a significant reduction in his level of left knee pain.  He continues to experience excellent response.  His right knee is now very bothersome with advanced osteoarthritis on radiograph last year.  He would like to pursue right geniculate artery embolization in hopes of avoiding arthroplasty and achieving similar pain response he's had with the left knee.    Plan for right geniculate artery embolization with moderate sedation via antegrade femoral approach at [facility]    Ester Sides, MD Pager: [phone]    I spent a total of 25 Minutes in virtual video clinical consultation, greater than  50% of which was counseling/coordinating care for left knee pain.

## 2023-12-10 NOTE — Telephone Encounter (Signed)
 The patient has a follow up appointment with Dr. Lavona on 9/29 at 12 pm

## 2023-12-13 ENCOUNTER — Ambulatory Visit
Admission: RE | Admit: 2023-12-13 | Discharge: 2023-12-13 | Disposition: A | Discharge status: Home / Self Care | Source: Ambulatory Visit | Attending: Interventional Radiology | Admitting: Interventional Radiology

## 2023-12-13 DIAGNOSIS — M25562 Pain in left knee: Secondary | ICD-10-CM

## 2023-12-13 DIAGNOSIS — Z9889 Other specified postprocedural states: Secondary | ICD-10-CM | POA: Diagnosis not present

## 2023-12-13 DIAGNOSIS — M1711 Unilateral primary osteoarthritis, right knee: Secondary | ICD-10-CM | POA: Diagnosis not present

## 2023-12-13 DIAGNOSIS — M1712 Unilateral primary osteoarthritis, left knee: Secondary | ICD-10-CM | POA: Diagnosis not present

## 2023-12-13 HISTORY — PX: IR RADIOLOGIST EVAL & MGMT: IMG5224

## 2023-12-14 DIAGNOSIS — H25812 Combined forms of age-related cataract, left eye: Secondary | ICD-10-CM | POA: Diagnosis not present

## 2023-12-14 DIAGNOSIS — H2512 Age-related nuclear cataract, left eye: Secondary | ICD-10-CM | POA: Diagnosis not present

## 2023-12-14 DIAGNOSIS — Z961 Presence of intraocular lens: Secondary | ICD-10-CM | POA: Diagnosis not present

## 2023-12-14 DIAGNOSIS — H25012 Cortical age-related cataract, left eye: Secondary | ICD-10-CM | POA: Diagnosis not present

## 2023-12-14 DIAGNOSIS — H25811 Combined forms of age-related cataract, right eye: Secondary | ICD-10-CM | POA: Diagnosis not present

## 2023-12-17 ENCOUNTER — Ambulatory Visit
Admission: RE | Admit: 2023-12-17 | Discharge: 2023-12-17 | Disposition: A | Discharge status: Home / Self Care | Source: Ambulatory Visit | Attending: Emergency Medicine | Admitting: Emergency Medicine

## 2023-12-17 ENCOUNTER — Ambulatory Visit (INDEPENDENT_AMBULATORY_CARE_PROVIDER_SITE_OTHER): Admitting: Radiology

## 2023-12-17 ENCOUNTER — Ambulatory Visit: Payer: Self-pay

## 2023-12-17 VITALS — BP 127/89 | HR 73 | Temp 98.0°F | Resp 17

## 2023-12-17 DIAGNOSIS — I700 Atherosclerosis of aorta: Secondary | ICD-10-CM | POA: Diagnosis not present

## 2023-12-17 DIAGNOSIS — Z981 Arthrodesis status: Secondary | ICD-10-CM | POA: Diagnosis not present

## 2023-12-17 DIAGNOSIS — R059 Cough, unspecified: Secondary | ICD-10-CM

## 2023-12-17 NOTE — Discharge Instructions (Signed)
 Radiology did not see acute cardiopulmonary process, including no pneumonia.  I think it would be beneficial to discuss getting an echocardiogram with your cardiologist on Monday.  We are checking some basic labs and we will contact you with urgent follow-up as needed, your cardiologist will also be able to view these labs.  The cough definitely could be coming from the ear postnasal drip due to allergies.  Please follow-up with your allergist.  Seek immediate care at the nearest emergency department if you develop chest pain, shortness of breath, or new concerning symptoms.

## 2023-12-17 NOTE — ED Provider Notes (Signed)
 [facility]    CSN: [id number] Arrival date & time: 12/17/23  1128      History   Chief Complaint Chief Complaint  Patient presents with   Cough    Wet cough - Entered by patient    HPI Joshua [name] is a 83 y.o. male.   Patient presents to clinic over concern of an ongoing cough that has been present for at least the last 2 months or so.  Symptoms are worse after eating lunch and at night he has constant nasal congestion, rhinorrhea and postnasal drip.  Reports a history of allergies and wife says he is allergic to the cats, has stopped allergy  injections for about 2 years but may be about to restart soon.  Cough has been productive with clear sputum but recently he has had some chest pressure and has been unable to cough up anything.  Has been seen multiple times for this cough.  Has been placed on antibiotics 3 times and steroids multiple times as well.  Will have temporary improvement and then coughing returns.  Leg swelling.  Cough is not worse when he lays flat.  Does have a history of acid reflux and takes Protonix .  Has not had fevers.  Reports fatigue, and is unable to sleep at night due to the cough.  Follows with cardiology for coronary artery disease and chest pain.  Post CABG in 2001.  Had cardiac cath in 2021.  At recent visit on 12/06/23 with shared decision making between patient and cardiologist deferred echocardiogram.  The history is provided by the patient, the spouse and medical records.  Cough   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   Blood transfusion without reported diagnosis 11/1999   CAD (coronary artery disease)    CABG 2001   Depression    Diverticulosis    DJD (degenerative joint disease)    BACK   GERD  (gastroesophageal reflux disease)    H/O inguinal hernia repair 12/2018   History of gout    Hyperlipidemia    Hypertension    IBS (irritable bowel syndrome)    Obesity    OSA (obstructive sleep apnea)    USES CPAP    Pancreatitis ~ 1980   Peripheral vascular disease    Pre-diabetes    Prostate cancer (HCC) 04/2010   Sleep apnea     Patient Active Problem List   Diagnosis Date Noted   Scrotal pain 02/11/2023   De Quervain's tenosynovitis, right 10/28/2022   Calcium  oxalate crystals in urine 07/30/2022   Balanitis 07/06/2022   Urinary frequency 07/06/2022   Degenerative arthritis of right knee 06/08/2022   Left hamstring injury 04/21/2022   Degenerative cervical disc 01/12/2022   Greater trochanteric bursitis of right hip 02/17/2021   Hyperactive bowel sounds 01/29/2021   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 09/25/2020   Primary osteoarthritis of left knee 06/18/2020   Trigger thumb, left thumb 01/24/2020   Overactive bladder 11/24/2019   Left lateral epicondylitis 09/05/2019   Greater trochanteric bursitis of left hip 10/27/2018   Degenerative disc disease, lumbar 10/27/2018   Abdominal muscle strain, initial encounter 06/16/2018   Aortic atherosclerosis 05/05/2018   Senile purpura  10/07/2017   Hyperglycemia 10/07/2017   Weakness of left leg 06/21/2017   DJD (degenerative joint disease)    Arthritis    Neck pain 02/08/2017   Venous insufficiency 12/29/2016   Ganglion cyst of tendon sheath of right hand 12/29/2016   Gout 11/20/2016   Obesity, Class I, BMI 30-34.9 10/03/2015   Hereditary and idiopathic peripheral neuropathy 09/25/2014   Allergic rhinitis 05/31/2014   Fatigue 11/09/2013   CKD (chronic kidney disease), stage III (HCC) 10/23/2013   Dizziness 08/23/2013   Neuropathy (HCC) 08/21/2013   Chronic bilateral low back pain with left-sided sciatica 10/09/2011   History of bladder cancer 04/01/2010   Localized osteoarthrosis, lower  leg 10/04/2009   Generalized abdominal pain 01/31/2009   Tinnitus 05/05/2007   Essential hypertension 02/21/2007   OSA (obstructive sleep apnea) 11/02/2006   Hyperlipidemia 10/12/2006   Coronary atherosclerosis 10/12/2006   Asthma 10/12/2006   GERD 10/12/2006    Past Surgical History:  Procedure Laterality Date   ANTERIOR CERVICAL DECOMP/DISCECTOMY FUSION  05/26/2006   PLATE PLACED   BACK SURGERY     CARDIAC CATHETERIZATION  11/20/1999   CORONARY ARTERY BYPASS GRAFT  11/20/1999   SVG-RI1-RI2, SVG-OM, SVG-dRCA   CORONARY STENT INTERVENTION N/A 04/13/2019   Procedure: CORONARY STENT INTERVENTION;  Surgeon: Verlin Lonni BIRCH, MD;  Location: [facility];  Service: Cardiovascular;  Laterality: N/A;   HERNIA REPAIR     INGUINAL HERNIA REPAIR Left 01/02/2019   Procedure: OPEN LEFT INGUINAL HERNIA REPAIR WITH MESH;  Surgeon: Gladis Cough, MD;  Location: [facility];  Service: General;  Laterality: Left;   IR EMBO ARTERIAL NOT HEMORR HEMANG INC GUIDE ROADMAPPING  03/08/2023   IR RADIOLOGIST EVAL & MGMT  02/05/2023   IR RADIOLOGIST EVAL & MGMT  04/05/2023   IR RADIOLOGIST EVAL & MGMT  04/09/2023   IR RADIOLOGIST EVAL & MGMT  04/30/2023   IR RADIOLOGIST EVAL & MGMT  12/13/2023   JOINT REPLACEMENT  Left knee   KNEE ARTHROSCOPY Right 1992; 04/2008   LAPAROSCOPIC CHOLECYSTECTOMY  2007   LEFT HEART CATH AND CORS/GRAFTS ANGIOGRAPHY N/A 04/13/2019   Procedure: LEFT HEART CATH AND CORS/GRAFTS ANGIOGRAPHY;  Surgeon: Verlin Lonni BIRCH, MD;  Location: [facility];  Service: Cardiovascular;  Laterality: N/A;   NASAL SEPTUM SURGERY Right 2007   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   ROBOT ASSISTED LAPAROSCOPIC RADICAL PROSTATECTOMY  04/2010   SPINE SURGERY     THYROIDECTOMY, PARTIAL  1980   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   Procedure: LEFT TOTAL KNEE ARTHROPLASTY;  Surgeon: Sheril Coy, MD;  Location: [facility];  Service: Orthopedics;  Laterality: Left;       Home  Medications    Prior to Admission medications   Medication Sig Start Date End Date Taking? Authorizing Provider  acetaminophen  (TYLENOL ) 500 MG tablet Take 1,000 mg by mouth every 6 (six) hours as needed for moderate pain or headache.    [provider]  albuterol  (PROAIR  HFA) 108 (90 Base) MCG/ACT inhaler Inhale 1-2 puffs into the lungs every 6 (six) hours as needed for wheezing or shortness of breath. 12/06/21   Lucius Krabbe, NP  Alirocumab  (PRALUENT ) 150 MG/ML SOAJ INJECT 1 DOSE INTO THE SKIN EVERY 14 (FOURTEEN) DAYS. 12/08/22   Hilty, Vinie BROCKS, MD  amLODipine  (NORVASC ) 5 MG tablet TAKE 1 TABLET (5 MG TOTAL) BY MOUTH DAILY. 02/03/23   Mona Vinie BROCKS, MD  aspirin  EC 81 MG tablet Take 1 tablet (81 mg total) by mouth 2 (  two) times daily after a meal. 06/18/20   Lenis Barter, PA-C  cholecalciferol (VITAMIN D3) 25 MCG (1000 UNIT) tablet Take 1,000 Units by mouth daily.    [provider]  Cyanocobalamin  (B-12) 2500 MCG TABS Take 1,250 mcg by mouth daily.    [provider]  diazepam  (VALIUM ) 5 MG tablet TAKE 1 TABLET (5 MG TOTAL) BY MOUTH AT BEDTIME AS NEEDED (SLEEP). 04/16/23   Katrinka Garnette KIDD, MD  EPINEPHrine  0.3 mg/0.3 mL IJ SOAJ injection Inject 0.3 mg into the muscle as needed for anaphylaxis. for allergic reaction 04/24/19   [provider]  ezetimibe  (ZETIA ) 10 MG tablet TAKE 1 TABLET BY MOUTH EVERY DAY 10/04/23   Lavona Agent, MD  fluticasone  (FLONASE ) 50 MCG/ACT nasal spray Place 1 spray into both nostrils 2 (two) times daily. 11/09/23   [provider]  folic acid  (FOLVITE ) 800 MCG tablet Take 800 mcg by mouth daily.     [provider]  gabapentin  (NEURONTIN ) 300 MG capsule Take 2 cap in AM, 1 cap at noon, 3 caps at bedtime 05/26/23   Georjean Darice HERO, MD  hydrochlorothiazide  (HYDRODIURIL ) 25 MG tablet TAKE 1 TABLET BY MOUTH EVERY DAY 10/04/23   Katrinka Garnette KIDD, MD  ketoconazole (NIZORAL) 2 % cream Apply 1 Application topically 2  (two) times daily as needed. 01/25/23   [provider]  levothyroxine  (SYNTHROID ) 25 MCG tablet TAKE 1 TABLET BY MOUTH DAILY BEFORE BREAKFAST. 11/10/23   Claudene Arthea HERO, DO  Loratadine  (CLARITIN  PO) Take by mouth at bedtime.    [provider]  MYRBETRIQ  50 MG TB24 tablet TAKE 1 TABLET BY MOUTH EVERY DAY (REPLACES GEMTESA ) 08/30/23   Katrinka Garnette KIDD, MD  nitroGLYCERIN  (NITROSTAT ) 0.4 MG SL tablet Place 1 tablet (0.4 mg total) under the tongue every 5 (five) minutes as needed for chest pain. 12/11/22   Katrinka Garnette KIDD, MD  pantoprazole  (PROTONIX ) 40 MG tablet TAKE 1 TABLET BY MOUTH EVERY DAY 04/12/23   Hilty, Vinie BROCKS, MD  polyethylene glycol powder (GLYCOLAX /MIRALAX ) powder Take 17 g by mouth daily. 08/29/14   Agent Anes, MD  valsartan  (DIOVAN ) 320 MG tablet TAKE 1 TABLET BY MOUTH EVERY DAY 06/25/23   Katrinka Garnette KIDD, MD    Family History Family History  Problem Relation Age of Onset   Heart disease Mother    Hyperlipidemia Mother    COPD Mother    Heart disease Father    Hyperlipidemia Father    Alcohol abuse Father    Obesity Father    Diabetes Brother    Other Brother        accidental injury leading to long term GI complications- seerious surgeyr 2025 planned   Dementia Brother        incontinence   Colon cancer Neg Hx    Pancreatic cancer Neg Hx    Esophageal cancer Neg Hx    Stomach cancer Neg Hx    Liver cancer Neg Hx     Social History Social History   Tobacco Use   Smoking status: Former    Current packs/day: 0.00    Average packs/day: 1 pack/day for 35.0 years (35.0 ttl pk-yrs)    Types: Cigarettes    Start date: 06/16/1957    Quit date: 06/16/1992    Years since quitting: 31.5    Passive exposure: Past   Smokeless tobacco: Never  Vaping Use   Vaping status: Never Used  Substance Use Topics   Alcohol use: Yes  Alcohol/week: 2.0 standard drinks of alcohol    Types: 2 Shots of liquor per week    Comment: occ   Drug use: No      Allergies   Codeine, Lexapro  [escitalopram ], Bromfed, Clarithromycin, Cymbalta  [duloxetine  hcl], Doxycycline hyclate, Modafinil, Oxycodone -acetaminophen , Promethazine hcl, Quinolones, Telithromycin, Tramadol , Benadryl [diphenhydramine], Hydrocodone -acetaminophen , Repatha  [evolocumab ], Statins, and Sulfonamide derivatives   Review of Systems Review of Systems  Per HPI  Physical Exam Triage Vital Signs ED Triage Vitals [12/17/23 1146]  Encounter Vitals Group     BP 127/89     Girls Systolic BP Percentile      Girls Diastolic BP Percentile      Boys Systolic BP Percentile      Boys Diastolic BP Percentile      Pulse Rate 73     Resp 17     Temp 98 F (36.7 C)     Temp Source Oral     SpO2 93 %     Weight      Height      Head Circumference      Peak Flow      Pain Score      Pain Loc      Pain Education      Exclude from Growth Chart    No data found.  Updated Vital Signs BP 127/89 (BP Location: Right Arm)   Pulse 73   Temp 98 F (36.7 C) (Oral)   Resp 17   SpO2 93%   Visual Acuity Right Eye Distance:   Left Eye Distance:   Bilateral Distance:    Right Eye Near:   Left Eye Near:    Bilateral Near:     Physical Exam Vitals and nursing note reviewed.  Constitutional:      Appearance: Normal appearance.  HENT:     Head: Normocephalic and atraumatic.     Right Ear: External ear normal.     Left Ear: External ear normal.     Nose: Congestion and rhinorrhea present.     Mouth/Throat:     Mouth: Mucous membranes are moist.  Eyes:     Conjunctiva/sclera: Conjunctivae normal.  Cardiovascular:     Rate and Rhythm: Normal rate and regular rhythm.     Heart sounds: Normal heart sounds. No murmur heard. Pulmonary:     Effort: Pulmonary effort is normal. No respiratory distress.     Breath sounds: Normal breath sounds. No wheezing.  Musculoskeletal:        General: Normal range of motion.  Skin:    General: Skin is warm and dry.  Neurological:      General: No focal deficit present.     Mental Status: He is alert and oriented to person, place, and time.  Psychiatric:        Mood and Affect: Mood normal.        Behavior: Behavior normal.      UC Treatments / Results  Labs (all labs ordered are listed, but only abnormal results are displayed) Labs Reviewed  CBC WITH DIFFERENTIAL/PLATELET  COMPREHENSIVE METABOLIC PANEL WITH GFR    EKG   Radiology DG Chest 2 View Result Date: 12/17/2023 EXAM: 2 VIEW(S) XRAY OF THE CHEST 12/17/2023 12:22:25 PM COMPARISON: 07/10/2023 CLINICAL HISTORY: cough. Pt states he has had a cough for a couple months but it has gotten worse over last 2 weeks. He has been seen by multiple provider. ; Symptoms worse at night. FINDINGS: LUNGS AND PLEURA: No focal pulmonary opacity. No pulmonary  edema. No pleural effusion. No pneumothorax. HEART AND MEDIASTINUM: Calcified aorta. Sternotomy and CABG changes noted. BONES AND SOFT TISSUES: Cervical fusion hardware noted. Thoracic degenerative changes. No acute osseous abnormality. IMPRESSION: 1. No acute cardiopulmonary process identified. Electronically signed by: Waddell Calk MD 12/17/2023 12:57 PM EDT RP Workstation: [id number]    Procedures Procedures (including critical care time)  Medications Ordered in UC Medications - No data to display  Initial Impression / Assessment and Plan / UC Course  I have reviewed the triage vital signs and the nursing notes.  Pertinent labs & imaging results that were available during my care of the patient were reviewed by me and considered in my medical decision making (see chart for details).  Vitals and triage reviewed, patient is hemodynamically stable.  Lungs vesicular, heart with regular rate and rhythm.  Congestion and rhinorrhea present on physical exam.  Chest x-ray by my interpretation does not show any acute cardiopulmonary disease, potential for slight heart enlargement since previous imaging.  Radiology overread  confirms no acute cardiopulmonary disease.  Will check basic labs, likely due to, but is not available at this facility.  Patient does have cardiology follow-up within the next 3 days, can discuss at this time.  Pain coming from postnasal drip due to allergies, patient does have allergy  follow-up scheduled as well.  Does not appear to have acute or emergent causes to the cough.  Follow-up discussed.  Plan of care, follow-up care return precautions given, no questions at this time.     Final Clinical Impressions(s) / UC Diagnoses   Final diagnoses:  Cough, unspecified type     Discharge Instructions      Radiology did not see acute cardiopulmonary process, including no pneumonia.  I think it would be beneficial to discuss getting an echocardiogram with your cardiologist on Monday.  We are checking some basic labs and we will contact you with urgent follow-up as needed, your cardiologist will also be able to view these labs.  The cough definitely could be coming from the ear postnasal drip due to allergies.  Please follow-up with your allergist.  Seek immediate care at the nearest emergency department if you develop chest pain, shortness of breath, or new concerning symptoms.      ED Prescriptions   None    PDMP not reviewed this encounter.   Dreama, Preslynn Bier  N, FNP 12/17/23 1305

## 2023-12-17 NOTE — Telephone Encounter (Signed)
 FYI Only or Action Required?: FYI only for provider.  Patient was last seen in primary care on 10/27/2023 by Katrinka Garnette KIDD, MD.  Called Nurse Triage reporting Cough. With phlegm - yellow and brown  Symptoms began about a month ago.  Interventions attempted: Nothing.  Symptoms are: gradually worsening.  Triage Disposition: See HCP Within 4 Hours (Or PCP Triage)  Patient/caregiver understands and will follow disposition?: Yes - UC                  Copied from CRM #[id number]. Topic: Clinical - Red Word Triage >> Dec 17, 2023  8:46 AM Amy B wrote: Red Word that prompted transfer to Nurse Triage: Chest pressure with severe cough for two months.  Now coughing up yellow and brown sputum Reason for Disposition  [1] MILD difficulty breathing (e.g., minimal/no SOB at rest, SOB with walking, pulse < 100) AND [2] still present when not coughing  Answer Assessment - Initial Assessment Questions 1. ONSET: When did the cough begin?      2 months - phlegm for 2 weeks 2. SEVERITY: How bad is the cough today?      Worse at night and first thing in the morning 3. SPUTUM: Describe the color of your sputum (e.g., none, dry cough; clear, white, yellow, green)     Yellow and brown 4. HEMOPTYSIS: Are you coughing up any blood? If Yes, ask: How much? (e.g., flecks, streaks, tablespoons, etc.)     no 5. DIFFICULTY BREATHING: Are you having difficulty breathing? If Yes, ask: How bad is it? (e.g., mild, moderate, severe)      yes 6. FEVER: Do you have a fever? If Yes, ask: What is your temperature, how was it measured, and when did it start?     no 7. CARDIAC HISTORY: Do you have any history of heart disease? (e.g., heart attack, congestive heart failure)      Low heart rate 8. LUNG HISTORY: Do you have any history of lung disease?  (e.g., pulmonary embolus, asthma, emphysema)     Bronchitis 2 times a year  10. OTHER SYMPTOMS: Do you have any other symptoms? (e.g.,  runny nose, wheezing, chest pain)       Chest pressure  12. TRAVEL: Have you traveled out of the country in the last month? (e.g., travel history, exposures)       no  Protocols used: Cough - Acute Productive-A-AH

## 2023-12-17 NOTE — Telephone Encounter (Signed)
 Noted

## 2023-12-17 NOTE — ED Triage Notes (Signed)
 Pt states he has had a cough for a couple months but it has gotten worse over last 2 weeks. He has been seen by multiple provider.  Symptoms worse at night.

## 2023-12-18 LAB — CBC WITH DIFFERENTIAL/PLATELET
Basophils Absolute: 0.1 x10E3/uL (ref 0.0–0.2)
Basos: 1 %
EOS (ABSOLUTE): 0.6 x10E3/uL — ABNORMAL HIGH (ref 0.0–0.4)
Eos: 7 %
Hematocrit: 44 % (ref 37.5–51.0)
Hemoglobin: 14.6 g/dL (ref 13.0–17.7)
Immature Grans (Abs): 0.1 x10E3/uL (ref 0.0–0.1)
Immature Granulocytes: 1 %
Lymphocytes Absolute: 1.4 x10E3/uL (ref 0.7–3.1)
Lymphs: 18 %
MCH: 31.6 pg (ref 26.6–33.0)
MCHC: 33.2 g/dL (ref 31.5–35.7)
MCV: 95 fL (ref 79–97)
Monocytes Absolute: 0.8 x10E3/uL (ref 0.1–0.9)
Monocytes: 10 %
Neutrophils Absolute: 5.1 x10E3/uL (ref 1.4–7.0)
Neutrophils: 63 %
Platelets: 211 x10E3/uL (ref 150–450)
RBC: 4.62 x10E6/uL (ref 4.14–5.80)
RDW: 12.4 % (ref 11.6–15.4)
WBC: 7.9 x10E3/uL (ref 3.4–10.8)

## 2023-12-18 LAB — COMPREHENSIVE METABOLIC PANEL WITH GFR
ALT: 17 IU/L (ref 0–44)
AST: 15 IU/L (ref 0–40)
Albumin: 4.3 g/dL (ref 3.8–4.8)
Alkaline Phosphatase: 75 IU/L (ref 47–123)
BUN/Creatinine Ratio: 14 (ref 10–24)
BUN: 18 mg/dL (ref 8–27)
Bilirubin Total: 0.9 mg/dL (ref 0.0–1.2)
CO2: 22 mmol/L (ref 20–29)
Calcium: 10 mg/dL (ref 8.6–10.2)
Chloride: 98 mmol/L (ref 96–106)
Creatinine, Ser: 1.28 mg/dL — ABNORMAL HIGH (ref 0.76–1.27)
Globulin, Total: 2.4 g/dL (ref 1.5–4.5)
Glucose: 115 mg/dL — ABNORMAL HIGH (ref 70–99)
Potassium: 4.9 mmol/L (ref 3.5–5.2)
Sodium: 137 mmol/L (ref 134–144)
Total Protein: 6.7 g/dL (ref 6.0–8.5)
eGFR: 57 mL/min/1.73 — ABNORMAL LOW (ref >59)

## 2023-12-19 NOTE — Progress Notes (Unsigned)
 Cardiology Office Note:   Date:  12/19/2023  ID:  Joshua [name], DOB [birth year], MRN [id number] PCP: Katrinka Garnette KIDD, MD  [facility] Providers Cardiologist:  Lynwood Schilling, MD {  History of Present Illness:   MONTA [name] is a 83 y.o. male  who presents for follow up of CAD s/p CABG, HTN, HLD, pancreatitis and OSA on CPAP. She had a negative perfusion study in 2015.  He had chest pain in January 2020 and routine treadmill stress test showing no ischemia.    Due to recurrent chest discomfort, patient eventually underwent cardiac catheterization on 04/13/2019 that revealed severe triple-vessel coronary artery disease s/p 5 vessel bypass with 5 out of 5 patent bypass grafts.  The native LAD was occluded beyond the first diagonal branch.  Patent LIMA graft. The circumflex is occluded at the ostium.  The moderate caliber distal OM branch fills from the patent vein graft.  The small caliber more proximal OM fills retrograde from collaterals.  The native RCA is occluded proximally.  The vein graft to the distal RCA is patent. A DES was successfully placed to origin to proximal graft lesion saphenous graft to third marginal.  Patient had normal left ventricular systolic function.  He was discharged on aspirin  and Plavix .   Since I last saw him he was seen in sports medicine and had a low HR. they were concerned because his heart rate was in the 40s.  He really did not feel this.  He has not really feel any palpitations and has no presyncope or syncope.  He has some dizziness most of the time but he does not describe any orthostatic symptoms.  He does not have presyncope or syncope.  He does not have chest discomfort.  I was able to review this EKG.  This demonstrated premature ventricular contractions in a trigeminal pattern with the rate being 71.  ROS: As stated in the HPI and negative for all other systems.  Studies Reviewed:    EKG:  9/17:  See above  Risk Assessment/Calculations:          Physical Exam:   VS:  There were no vitals taken for this visit.   Wt Readings from Last 3 Encounters:  12/08/23 198 lb (89.8 kg)  12/06/23 199 lb 12.8 oz (90.6 kg)  11/01/23 201 lb (91.2 kg)     GEN: Well nourished, well developed in no acute distress NECK: No JVD; No carotid bruits CARDIAC: RRR, no murmurs, rubs, gallops RESPIRATORY:  Clear to auscultation without rales, wheezing or rhonchi  ABDOMEN: Soft, non-tender, non-distended EXTREMITIES:  No edema; No deformity   ASSESSMENT AND PLAN:   CAD s/p CABG: The patient has no new sypmtoms.  No further cardiovascular testing is indicated.  We will continue with aggressive risk reduction and meds as listed.  Hyperlipidemia:   This is followed by Dr. Mona.  No change in therapy.  Hypertension:   The BP is tolerated.  No change in therapy.   Obstructive sleep apnea:   He wears CPAP.  Bradycardia/PVCs:    I do not think he is having any symptoms related to this.  I am going to evaluate him with a 3-day ZIO.  Otherwise no change in therapy.  PREOP: He is going to have an orthopedic injection for ablation of small vessels to control knee pain.  I see no contraindication to this.  No further cardiovascular testing is necessary prior to this procedure.     Follow up with  me in 12 months.   Signed, Lynwood Schilling, MD

## 2023-12-20 ENCOUNTER — Ambulatory Visit: Attending: Cardiology | Admitting: Cardiology

## 2023-12-20 ENCOUNTER — Encounter: Payer: Self-pay | Admitting: Cardiology

## 2023-12-20 ENCOUNTER — Ambulatory Visit

## 2023-12-20 ENCOUNTER — Ambulatory Visit (HOSPITAL_COMMUNITY): Payer: Self-pay

## 2023-12-20 VITALS — BP 127/70 | HR 60 | Ht 66.5 in | Wt 198.0 lb

## 2023-12-20 DIAGNOSIS — R002 Palpitations: Secondary | ICD-10-CM | POA: Diagnosis not present

## 2023-12-20 DIAGNOSIS — I25810 Atherosclerosis of coronary artery bypass graft(s) without angina pectoris: Secondary | ICD-10-CM

## 2023-12-20 DIAGNOSIS — R001 Bradycardia, unspecified: Secondary | ICD-10-CM | POA: Diagnosis not present

## 2023-12-20 DIAGNOSIS — E785 Hyperlipidemia, unspecified: Secondary | ICD-10-CM

## 2023-12-20 NOTE — Patient Instructions (Addendum)
 Medication Instructions:  Your physician recommends that you continue on your current medications as directed. Please refer to the Current Medication list given to you today.  *If you need a refill on your cardiac medications before your next appointment, please call your pharmacy*  Lab Work: NONE If you have labs (blood work) drawn today and your tests are completely normal, you will receive your results only by: MyChart Message (if you have MyChart) OR A paper copy in the mail If you have any lab test that is abnormal or we need to change your treatment, we will call you to review the results.  Testing/Procedures: 3 Day Zio Heart Monitor Your physician has requested that you wear a Zio heart monitor for __3___ days. This will be mailed to your home with instructions on how to apply the monitor and how to return it when finished. Please allow 2 weeks after returning the heart monitor before our office calls you with the results.   Follow-Up: At [facility], you and your health needs are our priority.  As part of our continuing mission to provide you with exceptional heart care, our providers are all part of one team.  This team includes your primary Cardiologist (physician) and Advanced Practice Providers or APPs (Physician Assistants and Nurse Practitioners) who all work together to provide you with the care you need, when you need it.  Your next appointment:   1 year  Provider:   [place], MD  We recommend signing up for the patient portal called MyChart.  Sign up information is provided on this After Visit Summary.  MyChart is used to connect with patients for Virtual Visits (Telemedicine).  Patients are able to view lab/test results, encounter notes, upcoming appointments, etc.  Non-urgent messages can be sent to your provider as well.   To learn more about what you can do with MyChart, go to [email or web address].   Other Instructions ZIO XT- Long Term Monitor  Instructions  Your physician has requested you wear a ZIO patch monitor for 3 days.  This is a single patch monitor. [employer] supplies one patch monitor per enrollment. Additional stickers are not available. Please do not apply patch if you will be having a Nuclear Stress Test,  Echocardiogram, Cardiac CT, MRI, or Chest Xray during the period you would be wearing the  monitor. The patch cannot be worn during these tests. You cannot remove and re-apply the  ZIO XT patch monitor.  Your ZIO patch monitor will be mailed 3 day USPS to your address on file. It may take 3-5 days  to receive your monitor after you have been enrolled.  Once you have received your monitor, please review the enclosed instructions. Your monitor  has already been registered assigning a specific monitor serial # to you.  Billing and Patient Assistance Program Information  We have supplied [employer] with any of your insurance information on file for billing purposes. [employer] offers a sliding scale Patient Assistance Program for patients that do not have  insurance, or whose insurance does not completely cover the cost of the ZIO monitor.  You must apply for the Patient Assistance Program to qualify for this discounted rate.  To apply, please call [employer] at [phone], select option 4, select option 2, ask to apply for  Patient Assistance Program. Meredeth will ask your household income, and how many people  are in your household. They will quote your out-of-pocket cost based on that information.  [employer] will also be able to set  up a 26-month, interest-free payment plan if needed.  Applying the monitor   Shave hair from upper left chest.  Hold abrader disc by orange tab. Rub abrader in 40 strokes over the upper left chest as  indicated in your monitor instructions.  Clean area with 4 enclosed alcohol pads. Let dry.  Apply patch as indicated in monitor instructions. Patch will be placed under collarbone on left  side  of chest with arrow pointing upward.  Rub patch adhesive wings for 2 minutes. Remove white label marked 1. Remove the white  label marked 2. Rub patch adhesive wings for 2 additional minutes.  While looking in a mirror, press and release button in center of patch. A small green light will  flash 3-4 times. This will be your only indicator that the monitor has been turned on.  Do not shower for the first 24 hours. You may shower after the first 24 hours.  Press the button if you feel a symptom. You will hear a small click. Record Date, Time and  Symptom in the Patient Logbook.  When you are ready to remove the patch, follow instructions on the last 2 pages of Patient  Logbook. Stick patch monitor onto the last page of Patient Logbook.  Place Patient Logbook in the blue and white box. Use locking tab on box and tape box closed  securely. The blue and white box has prepaid postage on it. Please place it in the mailbox as  soon as possible. Your physician should have your test results approximately 7 days after the  monitor has been mailed back to [facility].  Call [facility] Customer Care at [phone] if you have questions regarding  your ZIO XT patch monitor. Call them immediately if you see an orange light blinking on your  monitor.  If your monitor falls off in less than 4 days, contact our Monitor department at [phone].  If your monitor becomes loose or falls off after 4 days call [employer] at [phone] for  suggestions on securing your monitor

## 2023-12-20 NOTE — Progress Notes (Unsigned)
 ZIO serial # [id number] from office inventory applied to patient.

## 2023-12-21 NOTE — Progress Notes (Unsigned)
 [name] [address] [place] [address] Phone: [phone] Subjective:   [name], am serving as a scribe for Dr. Arthea Claudene.  I'm seeing this patient by the request  of:  Katrinka Garnette KIDD, MD  CC: Right knee pain  YEP:Dlagzrupcz  10/21/2023 Continues to have chronic back pain and pain all over.  Getting significantly improved with her treatment.  Did feel maybe some mild improvement with the Synthroid  that we did start.  Discussed icing regimen of home exercises, which activities to do in which ones to avoid.  Increase activity slowly.  Discussed icing regimen.  Follow-up again in 6 to 8 weeks.  Recheck TSH to see if we have any more room to potentially make changes.  Patient is on a very low dose at this moment.  Total time with patient today 31 minutes      Update 12/22/2023 Joshua [name] is a 83 y.o. male coming in with complaint of lumbar spine pain. Patient states that he is going to start PT at the end of this week.   R knee is painful over medial aspect and seems to be giving out.        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   Blood transfusion without reported diagnosis 11/1999   CAD (coronary artery disease)    CABG 2001   Depression    Diverticulosis    DJD (degenerative joint disease)    BACK   GERD (gastroesophageal reflux disease)    H/O inguinal hernia repair 12/2018   History of gout    Hyperlipidemia    Hypertension    IBS (irritable bowel syndrome)    Obesity    OSA (obstructive sleep apnea)    USES CPAP    Pancreatitis ~ 1980   Peripheral vascular disease    Pre-diabetes    Prostate cancer (HCC) 04/2010   Sleep apnea    Past Surgical History:  Procedure Laterality Date    ANTERIOR CERVICAL DECOMP/DISCECTOMY FUSION  05/26/2006   PLATE PLACED   BACK SURGERY     CARDIAC CATHETERIZATION  11/20/1999   CORONARY ARTERY BYPASS GRAFT  11/20/1999   SVG-RI1-RI2, SVG-OM, SVG-dRCA   CORONARY STENT INTERVENTION N/A 04/13/2019   Procedure: CORONARY STENT INTERVENTION;  Surgeon: Verlin Lonni BIRCH, MD;  Location: [facility];  Service: Cardiovascular;  Laterality: N/A;   HERNIA REPAIR     INGUINAL HERNIA REPAIR Left 01/02/2019   Procedure: OPEN LEFT INGUINAL HERNIA REPAIR WITH MESH;  Surgeon: Gladis Cough, MD;  Location: [facility];  Service: General;  Laterality: Left;   IR EMBO ARTERIAL NOT HEMORR HEMANG INC GUIDE ROADMAPPING  03/08/2023   IR RADIOLOGIST EVAL & MGMT  02/05/2023   IR RADIOLOGIST EVAL & MGMT  04/05/2023   IR RADIOLOGIST EVAL & MGMT  04/09/2023   IR RADIOLOGIST EVAL & MGMT  04/30/2023   IR RADIOLOGIST EVAL & MGMT  12/13/2023   JOINT REPLACEMENT  Left knee   KNEE ARTHROSCOPY Right 1992; 04/2008   LAPAROSCOPIC CHOLECYSTECTOMY  2007   LEFT HEART CATH AND CORS/GRAFTS ANGIOGRAPHY N/A 04/13/2019   Procedure: LEFT HEART CATH AND CORS/GRAFTS ANGIOGRAPHY;  Surgeon: Verlin Lonni BIRCH, MD;  Location:  [facility];  Service: Cardiovascular;  Laterality: N/A;   NASAL SEPTUM SURGERY Right 2007   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   ROBOT ASSISTED LAPAROSCOPIC RADICAL PROSTATECTOMY  04/2010   SPINE SURGERY     THYROIDECTOMY, PARTIAL  1980   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   Procedure: LEFT TOTAL KNEE ARTHROPLASTY;  Surgeon: Sheril Coy, MD;  Location: [facility];  Service: Orthopedics;  Laterality: Left;   Social History   Socioeconomic History   Marital status: Married    Spouse name: Not on file   Number of children: Not on file   Years of education: Not on file   Highest education level: Associate degree: occupational, Scientist, product/process development, or vocational program  Occupational History   Occupation: retired    Associate Professor: RETIRED  Tobacco Use    Smoking status: Former    Current packs/day: 0.00    Average packs/day: 1 pack/day for 35.0 years (35.0 ttl pk-yrs)    Types: Cigarettes    Start date: 06/16/1957    Quit date: 06/16/1992    Years since quitting: 31.5    Passive exposure: Past   Smokeless tobacco: Never  Vaping Use   Vaping status: Never Used  Substance and Sexual Activity   Alcohol use: Yes    Alcohol/week: 2.0 standard drinks of alcohol    Types: 2 Shots of liquor per week    Comment: occ   Drug use: No   Sexual activity: Not Currently    Partners: Female  Other Topics Concern   Not on file  Social History Narrative   Married 42 years in 2015. 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      Hobbies: poker, movies and tv, staying active      Right handed   2 level home with steps he uses   Social Drivers of Corporate investment banker Strain: Low Risk  (10/25/2023)   Overall Financial Resource Strain (CARDIA)    Difficulty of Paying Living Expenses: Not hard at all  Food Insecurity: No Food Insecurity (10/25/2023)   Hunger Vital Sign    Worried About Running Out of Food in the Last Year: Never true    Ran Out of Food in the Last Year: Never true  Transportation Needs: No Transportation Needs (10/25/2023)   PRAPARE - Administrator, Civil Service (Medical): No    Lack of Transportation (Non-Medical): No  Physical Activity: Inactive (10/25/2023)   Exercise Vital Sign    Days of Exercise per Week: 0 days    Minutes of Exercise per Session: Not on file  Stress: No Stress Concern Present (10/25/2023)   [employer] of Occupational Health - Occupational Stress Questionnaire    Feeling of Stress: Only a little  Social Connections: Moderately Isolated (10/25/2023)   Social Connection and Isolation Panel    Frequency of Communication with Friends and Family: More than three times a week    Frequency of Social Gatherings with Friends  and Family: Three times a week    Attends Religious Services: Never    Active Member of Clubs or Organizations: No    Attends Engineer, structural: Not on file    Marital Status: Married   Allergies  Allergen Reactions   Codeine Other (See Comments)    Trouble breathing   Lexapro  [Escitalopram ]     Dizziness   Bromfed Nausea  And Vomiting   Clarithromycin Other (See Comments)     gastritis   Cymbalta  [Duloxetine  Hcl]     Abdominal pain and later chest pain as well as worsening fatigue    Doxycycline Hyclate Nausea And Vomiting   Modafinil Other (See Comments)     anxiety-nervousness   Oxycodone -Acetaminophen  Itching   Promethazine Hcl Other (See Comments)     fainting   Quinolones Nausea Only    Cipro  felt real bad   Telithromycin Nausea And Vomiting   Tramadol  Nausea Only   Benadryl [Diphenhydramine] Other (See Comments)    Pt told not to take because of bypass surgery   Hydrocodone -Acetaminophen  Rash   Repatha  [Evolocumab ]     Weak, worn out   Statins Other (See Comments)    Leg cramps and makes patient feel bad. Per pt tried multiple in years past   Sulfonamide Derivatives Rash   Family History  Problem Relation Age of Onset   Heart disease Mother    Hyperlipidemia Mother    COPD Mother    Heart disease Father    Hyperlipidemia Father    Alcohol abuse Father    Obesity Father    Diabetes Brother    Other Brother        accidental injury leading to long term GI complications- seerious surgeyr 2025 planned   Dementia Brother        incontinence   Colon cancer Neg Hx    Pancreatic cancer Neg Hx    Esophageal cancer Neg Hx    Stomach cancer Neg Hx    Liver cancer Neg Hx     Current Outpatient Medications (Endocrine & Metabolic):    levothyroxine  (SYNTHROID ) 25 MCG tablet, TAKE 1 TABLET BY MOUTH DAILY BEFORE BREAKFAST.  Current Outpatient Medications (Cardiovascular):    Alirocumab  (PRALUENT ) 150 MG/ML SOAJ, INJECT 1 DOSE INTO THE SKIN EVERY 14  (FOURTEEN) DAYS.   amLODipine  (NORVASC ) 5 MG tablet, TAKE 1 TABLET (5 MG TOTAL) BY MOUTH DAILY.   EPINEPHrine  0.3 mg/0.3 mL IJ SOAJ injection, Inject 0.3 mg into the muscle as needed for anaphylaxis. for allergic reaction   ezetimibe  (ZETIA ) 10 MG tablet, TAKE 1 TABLET BY MOUTH EVERY DAY   hydrochlorothiazide  (HYDRODIURIL ) 25 MG tablet, TAKE 1 TABLET BY MOUTH EVERY DAY   nitroGLYCERIN  (NITROSTAT ) 0.4 MG SL tablet, Place 1 tablet (0.4 mg total) under the tongue every 5 (five) minutes as needed for chest pain.   valsartan  (DIOVAN ) 320 MG tablet, TAKE 1 TABLET BY MOUTH EVERY DAY  Current Outpatient Medications (Respiratory):    albuterol  (PROAIR  HFA) 108 (90 Base) MCG/ACT inhaler, Inhale 1-2 puffs into the lungs every 6 (six) hours as needed for wheezing or shortness of breath.   fluticasone  (FLONASE ) 50 MCG/ACT nasal spray, Place 1 spray into both nostrils 2 (two) times daily.   Loratadine  (CLARITIN  PO), Take by mouth at bedtime.  Current Outpatient Medications (Analgesics):    acetaminophen  (TYLENOL ) 500 MG tablet, Take 1,000 mg by mouth every 6 (six) hours as needed for moderate pain or headache.   aspirin  EC 81 MG tablet, Take 1 tablet (81 mg total) by mouth 2 (two) times daily after a meal.  Current Outpatient Medications (Hematological):    Cyanocobalamin  (B-12) 2500 MCG TABS, Take 1,250 mcg by mouth daily.   folic acid  (FOLVITE ) 800 MCG tablet, Take 800 mcg by mouth daily.   Current Outpatient Medications (Other):    cholecalciferol (VITAMIN D3) 25 MCG (1000 UNIT) tablet, Take 1,000 Units by mouth daily.  diazepam  (VALIUM ) 5 MG tablet, TAKE 1 TABLET (5 MG TOTAL) BY MOUTH AT BEDTIME AS NEEDED (SLEEP).   gabapentin  (NEURONTIN ) 300 MG capsule, Take 2 cap in AM, 1 cap at noon, 3 caps at bedtime   ketoconazole (NIZORAL) 2 % cream, Apply 1 Application topically 2 (two) times daily as needed.   MYRBETRIQ  50 MG TB24 tablet, TAKE 1 TABLET BY MOUTH EVERY DAY (REPLACES GEMTESA )   pantoprazole   (PROTONIX ) 40 MG tablet, TAKE 1 TABLET BY MOUTH EVERY DAY   polyethylene glycol powder (GLYCOLAX /MIRALAX ) powder, Take 17 g by mouth daily.   Reviewed prior external information including notes and imaging from  primary care provider As well as notes that were available from care everywhere and other healthcare systems.  Past medical history, social, surgical and family history all reviewed in electronic medical record.  No pertanent information unless stated regarding to the chief complaint.   Review of Systems:  No headache, visual changes, nausea, vomiting, diarrhea, constipation, dizziness, abdominal pain, skin rash, fevers, chills, night sweats, weight loss, swollen lymph nodes, body aches, joint swelling, chest pain, shortness of breath, mood changes. POSITIVE muscle aches  Objective  Blood pressure (!) 114/52, pulse (!) 54, height 5' 6.5 (1.689 m), weight 199 lb (90.3 kg), SpO2 94%.   General: No apparent distress alert and oriented x3 mood and affect normal, dressed appropriately.  HEENT: Pupils equal, extraocular movements intact  Respiratory: Patient's speak in full sentences and does not appear short of breath  Severely antalgic gait noted.  Patient does have some crepitus noted.  Some tenderness to palpation over the patellofemoral joint. Instability with valgus and varus force.    After informed written and verbal consent, patient was seated on exam table. Right knee was prepped with alcohol swab and utilizing anterolateral approach, patient's right knee space was injected with 4:1  marcaine  0.5%: Kenalog 40mg /dL. Patient tolerated the procedure well without immediate complications.   Impression and Recommendations:     The above documentation has been reviewed and is accurate and complete Joshua Prindle M Laneta Guerin, DO

## 2023-12-22 ENCOUNTER — Ambulatory Visit: Admitting: Family Medicine

## 2023-12-22 ENCOUNTER — Encounter: Payer: Self-pay | Admitting: Family Medicine

## 2023-12-22 ENCOUNTER — Telehealth: Payer: Self-pay

## 2023-12-22 VITALS — BP 114/52 | HR 54 | Ht 66.5 in | Wt 199.0 lb

## 2023-12-22 DIAGNOSIS — M1711 Unilateral primary osteoarthritis, right knee: Secondary | ICD-10-CM | POA: Diagnosis not present

## 2023-12-22 NOTE — Patient Instructions (Addendum)
 Injected R knee today See me in 10-12 weeks

## 2023-12-22 NOTE — Assessment & Plan Note (Addendum)
 Chronic problem, feels like his left knee is doing much better patient is considering the possibility of a replacement now on this right side.  Discussed with him that would want a wait 2 months until after the injection to truly have the surgery.  Patient is in agreement with the plan.  Will follow-up with his orthopedic surgeon to discuss further.  Due to the instability given a hinged brace

## 2023-12-22 NOTE — Telephone Encounter (Signed)
 Patient ran for Monovisc for right knee. Case [id number]. Pending approval.

## 2023-12-22 NOTE — Telephone Encounter (Signed)
-----   Message from Berwyn Posey sent at 12/22/2023  9:34 AM EDT ----- Regarding: visco Can you please run patient for visco for R knee?  Thank you

## 2023-12-23 NOTE — Telephone Encounter (Signed)
 Patient needs an appointment once medication is stocked.   Monovisc approved for right knee.   Dudctible does not apply. Once the OOP has been met, patient is covered at 100%. Prior authorization is not required.  Ref # [id number]  Exp: 06/21/2024

## 2023-12-23 NOTE — Telephone Encounter (Signed)
Scheduled 12/10

## 2023-12-24 ENCOUNTER — Ambulatory Visit: Admitting: Physical Therapy

## 2023-12-24 ENCOUNTER — Encounter: Payer: Self-pay | Admitting: Physical Therapy

## 2023-12-24 ENCOUNTER — Other Ambulatory Visit: Payer: Self-pay

## 2023-12-24 DIAGNOSIS — M6281 Muscle weakness (generalized): Secondary | ICD-10-CM

## 2023-12-24 DIAGNOSIS — M5459 Other low back pain: Secondary | ICD-10-CM

## 2023-12-24 NOTE — Patient Instructions (Signed)
 Access Code: [email or web address] URL: [email or web address] Date: 12/24/2023 Prepared by: Elaine Daring  Exercises - Supine Lower Trunk Rotation  - 1 x daily - 10 reps - 5 seconds hold - Straight Leg Raise  - 1 x daily - 2 sets - 10 reps - Clamshell  - 1 x daily - 20 reps - Modified Thomas Stretch  - 1 x daily - 1 minute hold - Sit to Stand Without Arm Support  - 1 x daily - 2 sets - 10 reps - Seated Hamstring Stretch  - 1 x daily - 3 reps - 20 seconds hold

## 2023-12-24 NOTE — Therapy (Signed)
 OUTPATIENT PHYSICAL THERAPY EVALUATION   Patient Name: Joshua [name] MRN: [id number] DOB:Mar 22, 1941, 83 y.o., male Today's Date: 12/24/2023   END OF SESSION:  PT End of Session - 12/24/23 0941     Visit Number 1    Number of Visits 9    Date for Recertification  02/18/24    Authorization Type [employer]    Progress Note Due on Visit 10    PT Start Time 0931    PT Stop Time 1023    PT Time Calculation (min) 52 min    Activity Tolerance Patient tolerated treatment well    Behavior During Therapy [facility] for tasks assessed/performed          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   Blood transfusion without reported diagnosis 11/1999   CAD (coronary artery disease)    CABG 2001   Depression    Diverticulosis    DJD (degenerative joint disease)    BACK   GERD (gastroesophageal reflux disease)    H/O inguinal hernia repair 12/2018   History of gout    Hyperlipidemia    Hypertension    IBS (irritable bowel syndrome)    Obesity    OSA (obstructive sleep apnea)    USES CPAP    Pancreatitis ~ 1980   Peripheral vascular disease    Pre-diabetes    Prostate cancer (HCC) 04/2010   Sleep apnea    Past Surgical History:  Procedure Laterality Date   ANTERIOR CERVICAL DECOMP/DISCECTOMY FUSION  05/26/2006   PLATE PLACED   BACK SURGERY     CARDIAC CATHETERIZATION  11/20/1999   CORONARY ARTERY BYPASS GRAFT  11/20/1999   SVG-RI1-RI2, SVG-OM, SVG-dRCA   CORONARY STENT INTERVENTION N/A 04/13/2019   Procedure: CORONARY STENT INTERVENTION;  Surgeon: Verlin Lonni BIRCH, MD;  Location: [facility];  Service: Cardiovascular;  Laterality: N/A;   HERNIA REPAIR     INGUINAL HERNIA REPAIR Left 01/02/2019   Procedure: OPEN LEFT INGUINAL HERNIA  REPAIR WITH MESH;  Surgeon: Gladis Cough, MD;  Location: [facility];  Service: General;  Laterality: Left;   IR EMBO ARTERIAL NOT HEMORR HEMANG INC GUIDE ROADMAPPING  03/08/2023   IR RADIOLOGIST EVAL & MGMT  02/05/2023   IR RADIOLOGIST EVAL & MGMT  04/05/2023   IR RADIOLOGIST EVAL & MGMT  04/09/2023   IR RADIOLOGIST EVAL & MGMT  04/30/2023   IR RADIOLOGIST EVAL & MGMT  12/13/2023   JOINT REPLACEMENT  Left knee   KNEE ARTHROSCOPY Right 1992; 04/2008   LAPAROSCOPIC CHOLECYSTECTOMY  2007   LEFT HEART CATH AND CORS/GRAFTS ANGIOGRAPHY N/A 04/13/2019   Procedure: LEFT HEART CATH AND CORS/GRAFTS ANGIOGRAPHY;  Surgeon: Verlin Lonni BIRCH, MD;  Location: [facility];  Service: Cardiovascular;  Laterality: N/A;   NASAL SEPTUM SURGERY Right 2007   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   ROBOT ASSISTED LAPAROSCOPIC RADICAL PROSTATECTOMY  04/2010   SPINE SURGERY     THYROIDECTOMY, PARTIAL  1980   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   Procedure: LEFT TOTAL KNEE ARTHROPLASTY;  Surgeon: Sheril Coy, MD;  Location: [facility];  Service: Orthopedics;  Laterality: Left;   Patient Active Problem List   Diagnosis Date Noted   Scrotal pain  02/11/2023   De Quervain's tenosynovitis, right 10/28/2022   Calcium  oxalate crystals in urine 07/30/2022   Balanitis 07/06/2022   Urinary frequency 07/06/2022   Degenerative arthritis of right knee 06/08/2022   Left hamstring injury 04/21/2022   Degenerative cervical disc 01/12/2022   Greater trochanteric bursitis of right hip 02/17/2021   Hyperactive bowel sounds 01/29/2021   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 09/25/2020   Primary osteoarthritis of left knee 06/18/2020   Trigger thumb, left thumb 01/24/2020   Overactive bladder 11/24/2019   Left lateral epicondylitis 09/05/2019   Greater trochanteric bursitis of left hip 10/27/2018   Degenerative disc disease, lumbar 10/27/2018   Abdominal muscle strain, initial encounter  06/16/2018   Aortic atherosclerosis 05/05/2018   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   Hyperglycemia 10/07/2017   Weakness of left leg 06/21/2017   DJD (degenerative joint disease)    Arthritis    Neck pain 02/08/2017   Venous insufficiency 12/29/2016   Ganglion cyst of tendon sheath of right hand 12/29/2016   Gout 11/20/2016   Obesity, Class I, BMI 30-34.9 10/03/2015   Hereditary and idiopathic peripheral neuropathy 09/25/2014   Allergic rhinitis 05/31/2014   Fatigue 11/09/2013   CKD (chronic kidney disease), stage III (HCC) 10/23/2013   Dizziness 08/23/2013   Neuropathy (HCC) 08/21/2013   Chronic bilateral low back pain with left-sided sciatica 10/09/2011   History of bladder cancer 04/01/2010   Localized osteoarthrosis, lower leg 10/04/2009   Generalized abdominal pain 01/31/2009   Tinnitus 05/05/2007   Essential hypertension 02/21/2007   OSA (obstructive sleep apnea) 11/02/2006   Hyperlipidemia 10/12/2006   Coronary atherosclerosis 10/12/2006   Asthma 10/12/2006   GERD 10/12/2006    PCP: Katrinka Garnette KIDD, MD  REFERRING PROVIDER: Joane Artist RAMAN, MD  REFERRING DIAG: Chronic bilateral low back pain without sciatica  Rationale for Evaluation and Treatment: Rehabilitation  THERAPY DIAG:  Other low back pain  Muscle weakness (generalized)  ONSET DATE: Chronic   SUBJECTIVE SUBJECTIVE STATEMENT: Patient reports he has had back pain for a long time with no mechanism of injury. He tried pool therapy at least 3 years ago and he didn't like that. Today his back pain is good, most days are ok, and then occasionally he has severe pain. He thinks lifting heavy objects or combine squatting and bending to clean the litter box causes his pain to get worse. He reports difficulty getting out of bed in the morning. Pain is located to the lower back, across the back and worse on the left side. He has had epidurals in his back that worked for a while and then stopped working. He does report  painful peripheral neuropathy in his feet that seems to affect his balance.   PERTINENT HISTORY:  See PMH above  PAIN:  Are you having pain? Yes:  NPRS scale: 3/10 currently, 7/10 at worst Pain location: Lower back Pain description: pushing type of pain Aggravating factors: Lifting, squatting and bending Relieving factors: Massage, heat, Tylenol   PRECAUTIONS: None  RED FLAGS: None   WEIGHT BEARING RESTRICTIONS: No  FALLS:  Has patient fallen in last 6 months? No  PLOF: Independent  PATIENT GOALS: Improve pain   OBJECTIVE:  Note: Objective measures were completed at Evaluation unless otherwise noted. PATIENT SURVEYS:  Modified Oswestry: 24% disability (12/50)  COGNITION: Overall cognitive status: Within functional limits for tasks assessed     SENSATION: Patient report bilateral LE neuropathy  MUSCLE LENGTH: Limitation   POSTURE:  Decreased lumbar lordosis,  rounded shoulder posture, forward trunk lean  PALPATION: Tender to palpation across lumbar region, L > R  LUMBAR ROM:   AROM eval  Flexion 25%  Extension 50%  Right lateral flexion 25%  Left lateral flexion 25%  Right rotation 50%  Left rotation 25%   (Blank rows = not tested)  LOWER EXTREMITY ROM:      Hip PROM grossly WFL  LOWER EXTREMITY MMT:    MMT Right eval Left eval  Hip flexion 4 4-  Hip extension 3- 3-  Hip abduction 3- 3-  Hip adduction    Hip internal rotation    Hip external rotation    Knee flexion 4+ 4+  Knee extension 4+ 4  Ankle dorsiflexion    Ankle plantarflexion    Ankle inversion    Ankle eversion     (Blank rows = not tested)  LUMBAR SPECIAL TESTS:  Lumbar radicular testing negative  FUNCTIONAL TESTS:  5 times sit to stand: 18 seconds  GAIT: Assistive device utilized: None Level of assistance: Complete Independence Comments: Forward trunk lean, wide BOS   TREATMENT [facility] Adult PT Treatment:                                                DATE:  12/24/2023 LTR 5 x 5 sec SLR x 10 each Side clamshell x 10 Modified thomas stretch x 60 sec each Sit to stand x 10 Seated hamstring stretch x 30 sec each  Discussed goal of therapy and exercises to improve his lower back mobility, stretching for the hip flexor and hamstring, and gradually improve his back/core and hip/LE muscle strength  PATIENT EDUCATION:  Education details: Exam findings, POC, HEP Person educated: Patient Education method: Explanation, Demonstration, Tactile cues, Verbal cues, and Handouts Education comprehension: verbalized understanding, returned demonstration, verbal cues required, tactile cues required, and needs further education  HOME EXERCISE PROGRAM: Access Code: [email or web address]    ASSESSMENT: CLINICAL IMPRESSION: Patient is a 83 y.o. male who was seen today for physical therapy evaluation and treatment for chronic lower back pain. He exhibits limitations in his lumbar mobility, hip and hamstring flexibility, and gross strength deficits with postural deviations that are contributing to his pain and impacting his functional ability.   OBJECTIVE IMPAIRMENTS: Abnormal gait, decreased activity tolerance, decreased balance, decreased ROM, decreased strength, impaired flexibility, postural dysfunction, and pain.   ACTIVITY LIMITATIONS: carrying, lifting, bending, standing, squatting, sleeping, and locomotion level  PARTICIPATION LIMITATIONS: cleaning and community activity  PERSONAL FACTORS: Fitness, Past/current experiences, and Time since onset of injury/illness/exacerbation are also affecting patient's functional outcome.   REHAB POTENTIAL: Good  CLINICAL DECISION MAKING: Stable/uncomplicated  EVALUATION COMPLEXITY: Low   GOALS: Goals reviewed with patient? No  SHORT TERM GOALS: Target date: 01/21/2024  Patient will be I with initial HEP in order to progress with therapy. Baseline: HEP provided at eval Goal status: INITIAL  2.  Patient will report low  back pain </= 4/10 at worst in order to reduce functional limitations Baseline: 7/10 Goal status: INITIAL  LONG TERM GOALS: Target date: 02/18/2024  Patient will be I with final HEP to maintain progress from PT. Baseline: HEP provided at eval Goal status: INITIAL  2.  Patient will report Modified Oswestry </= 6/50 (12% disability) in order to indicate an improvement in their functional status Baseline: 24% Goal status: INITIAL  3.  Patient  will demonstrate a 25% improvement in his lumbar motion in order to improve his ability to bend and clean the litter box at home Baseline: see limitations above Goal status: INITIAL  4.  Patient will exhibit hip strength >/= 4-/5 MMT in order to improve standing, walking, and lifting tolerance Baseline: grossly 3-/5 MMT Goal status: INITIAL  5. Patient will demonstrate 5xSTS </= 12 seconds in order to indicate improved LE strength  Baseline: 18 seconds  Goal status: INITIAL   PLAN: PT FREQUENCY: 1x/week  PT DURATION: 8 weeks  PLANNED INTERVENTIONS: 97164- PT Re-evaluation, 97750- Physical Performance Testing, 97110-Therapeutic exercises, 97530- Therapeutic activity, 97112- Neuromuscular re-education, 97535- Self Care, 02859- Manual therapy, 20560 (1-2 muscles), 20561 (3+ muscles)- Dry Needling, Patient/Family education, Joint mobilization, Joint manipulation, Spinal manipulation, Spinal mobilization, Cryotherapy, and Moist heat.  PLAN FOR NEXT SESSION: Review HEP and progress PRN, manual/stretching for lumbar spine, hips, and hamstring, progress core stabilization and hip/LE strengthening as tolerated; further assessment of balance   Elaine Daring, PT, DPT, LAT, ATC 12/24/23  11:37 AM Phone: [phone] Fax: [phone]

## 2023-12-28 ENCOUNTER — Other Ambulatory Visit: Payer: Self-pay | Admitting: Internal Medicine

## 2023-12-28 DIAGNOSIS — R002 Palpitations: Secondary | ICD-10-CM | POA: Diagnosis not present

## 2023-12-28 DIAGNOSIS — I2510 Atherosclerotic heart disease of native coronary artery without angina pectoris: Secondary | ICD-10-CM

## 2023-12-28 DIAGNOSIS — E785 Hyperlipidemia, unspecified: Secondary | ICD-10-CM

## 2023-12-29 ENCOUNTER — Other Ambulatory Visit: Payer: Self-pay | Admitting: Interventional Radiology

## 2023-12-29 DIAGNOSIS — M1711 Unilateral primary osteoarthritis, right knee: Secondary | ICD-10-CM

## 2023-12-30 DIAGNOSIS — R002 Palpitations: Secondary | ICD-10-CM

## 2023-12-31 ENCOUNTER — Ambulatory Visit (INDEPENDENT_AMBULATORY_CARE_PROVIDER_SITE_OTHER): Admitting: Family Medicine

## 2023-12-31 ENCOUNTER — Encounter: Payer: Self-pay | Admitting: Family Medicine

## 2023-12-31 VITALS — BP 102/62 | HR 42 | Temp 97.3°F | Ht 66.5 in | Wt 197.8 lb

## 2023-12-31 DIAGNOSIS — N3281 Overactive bladder: Secondary | ICD-10-CM | POA: Diagnosis not present

## 2023-12-31 DIAGNOSIS — R053 Chronic cough: Secondary | ICD-10-CM

## 2023-12-31 DIAGNOSIS — I10 Essential (primary) hypertension: Secondary | ICD-10-CM

## 2023-12-31 DIAGNOSIS — I2510 Atherosclerotic heart disease of native coronary artery without angina pectoris: Secondary | ICD-10-CM | POA: Diagnosis not present

## 2023-12-31 DIAGNOSIS — J4541 Moderate persistent asthma with (acute) exacerbation: Secondary | ICD-10-CM

## 2023-12-31 DIAGNOSIS — E785 Hyperlipidemia, unspecified: Secondary | ICD-10-CM

## 2023-12-31 MED ORDER — FLUTICASONE PROPIONATE HFA 110 MCG/ACT IN AERO: 1.0000 | INHALATION_SPRAY | Freq: Two times a day (BID) | RESPIRATORY_TRACT | 12 refills | Status: DC

## 2023-12-31 NOTE — Patient Instructions (Addendum)
 Try half tablet of valsartan  so 160 mg - very slight chance of cough with this medication(s) but mainly reducing with blood pressure looking so good lately- prefer still <130/80 and can go back if gets above this  Retrial flovent   Follow up with allergist  Recommended follow up: Return in about 2 months (around 03/01/2024) for followup or sooner if needed.Schedule b4 you leave.

## 2023-12-31 NOTE — Progress Notes (Signed)
 Phone [phone] In person visit   Subjective:   Joshua [name] is a 83 y.o. year old very pleasant male patient who presents for/with See problem oriented charting Chief Complaint  Patient presents with   Cough    X3 months; getting better but still coughing; clear with yellow streaks that is sticky and gets stuck in throat;    Past Medical History-  Patient Active Problem List   Diagnosis Date Noted   Scrotal pain 02/11/2023    Priority: High   Coronary atherosclerosis 10/12/2006    Priority: High   Overactive bladder 11/24/2019    Priority: Medium    Hyperglycemia 10/07/2017    Priority: Medium    Gout 11/20/2016    Priority: Medium    CKD (chronic kidney disease), stage III (HCC) 10/23/2013    Priority: Medium    Neuropathy (HCC) 08/21/2013    Priority: Medium    History of bladder cancer 04/01/2010    Priority: Medium    Essential hypertension 02/21/2007    Priority: Medium    Hyperlipidemia 10/12/2006    Priority: Medium    Asthma 10/12/2006    Priority: Medium    Aortic atherosclerosis 05/05/2018    Priority: Low   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    Priority: Low   DJD (degenerative joint disease)     Priority: Low   Arthritis     Priority: Low   Neck pain 02/08/2017    Priority: Low   Venous insufficiency 12/29/2016    Priority: Low   Ganglion cyst of tendon sheath of right hand 12/29/2016    Priority: Low   Obesity, Class I, BMI 30-34.9 10/03/2015    Priority: Low   Allergic rhinitis 05/31/2014    Priority: Low   Fatigue 11/09/2013    Priority: Low   Dizziness 08/23/2013    Priority: Low   Chronic bilateral low back pain with left-sided sciatica 10/09/2011    Priority: Low   Localized osteoarthrosis, lower leg 10/04/2009    Priority: Low   Tinnitus 05/05/2007    Priority: Low   OSA (obstructive sleep apnea) 11/02/2006    Priority: Low   GERD 10/12/2006    Priority: Low   De Quervain's tenosynovitis, right 10/28/2022    Priority: 1.    Degenerative arthritis of right knee 06/08/2022    Priority: 1.   Left hamstring injury 04/21/2022    Priority: 1.   Degenerative cervical disc 01/12/2022    Priority: 1.   Greater trochanteric bursitis of right hip 02/17/2021    Priority: 1.   Primary osteoarthritis of left knee 06/18/2020    Priority: 1.   Trigger thumb, left thumb 01/24/2020    Priority: 1.   Greater trochanteric bursitis of left hip 10/27/2018    Priority: 1.   Degenerative disc disease, lumbar 10/27/2018    Priority: 1.   Abdominal muscle strain, initial encounter 06/16/2018    Priority: 1.   Weakness of left leg 06/21/2017    Priority: 1.   Calcium  oxalate crystals in urine 07/30/2022   Balanitis 07/06/2022   Urinary frequency 07/06/2022   Hyperactive bowel sounds 01/29/2021   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 09/25/2020   Left lateral epicondylitis 09/05/2019   Hereditary and idiopathic peripheral neuropathy 09/25/2014   Generalized abdominal pain 01/31/2009    Medications- reviewed and updated Current Outpatient Medications  Medication Sig Dispense Refill   acetaminophen  (TYLENOL ) 500 MG tablet Take 1,000 mg by mouth every 6 (six)  hours as needed for moderate pain or headache.     albuterol  (PROAIR  HFA) 108 (90 Base) MCG/ACT inhaler Inhale 1-2 puffs into the lungs every 6 (six) hours as needed for wheezing or shortness of breath. 18 g 5   Alirocumab  (PRALUENT ) 150 MG/ML SOAJ INJECT CONTENTS OF 1 PEN (150MG ) INTO THE SKIN EVERY 14 DAYS 2 mL 11   amLODipine  (NORVASC ) 5 MG tablet TAKE 1 TABLET (5 MG TOTAL) BY MOUTH DAILY. 90 tablet 3   aspirin  EC 81 MG tablet Take 1 tablet (81 mg total) by mouth 2 (two) times daily after a meal. 30 tablet 11   cholecalciferol (VITAMIN D3) 25 MCG (1000 UNIT) tablet Take 1,000 Units by mouth daily.     Cyanocobalamin  (B-12) 2500 MCG TABS Take 1,250 mcg by mouth daily.     diazepam  (VALIUM ) 5 MG tablet TAKE 1 TABLET (5 MG TOTAL) BY MOUTH AT  BEDTIME AS NEEDED (SLEEP). 30 tablet 0   EPINEPHrine  0.3 mg/0.3 mL IJ SOAJ injection Inject 0.3 mg into the muscle as needed for anaphylaxis. for allergic reaction     ezetimibe  (ZETIA ) 10 MG tablet TAKE 1 TABLET BY MOUTH EVERY DAY 90 tablet 2   fluticasone  (FLONASE ) 50 MCG/ACT nasal spray Place 1 spray into both nostrils 2 (two) times daily.     fluticasone  (FLOVENT  HFA) 110 MCG/ACT inhaler Inhale 1 puff into the lungs in the morning and at bedtime. 1 each 12   folic acid  (FOLVITE ) 800 MCG tablet Take 800 mcg by mouth daily.      gabapentin  (NEURONTIN ) 300 MG capsule Take 2 cap in AM, 1 cap at noon, 3 caps at bedtime 540 capsule 3   hydrochlorothiazide  (HYDRODIURIL ) 25 MG tablet TAKE 1 TABLET BY MOUTH EVERY DAY 90 tablet 2   ketoconazole (NIZORAL) 2 % cream Apply 1 Application topically 2 (two) times daily as needed.     Loratadine  (CLARITIN  PO) Take by mouth at bedtime.     MYRBETRIQ  50 MG TB24 tablet TAKE 1 TABLET BY MOUTH EVERY DAY (REPLACES GEMTESA ) 90 tablet 1   nitroGLYCERIN  (NITROSTAT ) 0.4 MG SL tablet Place 1 tablet (0.4 mg total) under the tongue every 5 (five) minutes as needed for chest pain. 30 tablet 5   pantoprazole  (PROTONIX ) 40 MG tablet TAKE 1 TABLET BY MOUTH EVERY DAY 90 tablet 3   polyethylene glycol powder (GLYCOLAX /MIRALAX ) powder Take 17 g by mouth daily. 255 g 0   valsartan  (DIOVAN ) 320 MG tablet TAKE 1 TABLET BY MOUTH EVERY DAY 90 tablet 3   No current facility-administered medications for this visit.     Objective:  BP 102/62 (BP Location: Left Arm, Patient Position: Sitting, Cuff Size: Normal)   Pulse (!) 42   Temp (!) 97.3 F (36.3 C) (Temporal)   Ht 5' 6.5 (1.689 m)   Wt 197 lb 12.8 oz (89.7 kg)   SpO2 95%   BMI 31.45 kg/m  Gen: NAD, resting comfortably Nasal turbinates largely normal, oropharynx normal, tympanic members normal CV: RRR no murmurs rubs or gallops-not significantly bradycardic on my exam despite being bradycardic on initial check Lungs:  CTAB no crackles, wheeze, rhonchi  Ext: no edema Skin: warm, dry    Assessment and Plan   # Chronic cough S: Patient reports about 3 months of symptoms.  At present seems to be getting slightly better but still coughing.  Notes clear but yellow streaks.  Can be sticky and get stuck in his throat at times -In July was treated for  sinusitis with steroids by Corean Sprawls, NP but even at that point he mentioned more chronic symptoms -Was seen in urgent care 12/17/2023-he did have chest x-ray on 12/17/2023 and no acute cardiopulmonary processes were identified.  He reported during this timeframe being on antibiotics 3 times and steroids multiple times as well.  They were concerned about allergies and he reported upcoming allergist appointment already scheduled- in a few weeks -recent COVID test within a week negative  In regards to potential chronic causes of cough: - For GERD already on pantoprazole  40 mg daily - On ARB but no ACE inhibitor and valsartan  has rather low risk for cough -Allergies certainly contributing=upcoming allergy  visit within 2 weeks-  on claritin , still on flonase  -Does have asthma history and had been on Flovent  in the past -taken off in late June A/P: Patient with chronic cough for about 3 months-in late June he stopped Flovent  and we opted to retry that to see if that makes a difference in case this is cough variant asthma.  He has follow-up within 2 weeks with allergist to recheck - Valsartan  has slight chance of being associated with cough and we are going to target lowering that dose with his lower blood pressure today-although I do not think this will make a substantial difference unless he were to come off if this was due to the valsartan  - Continue Claritin  and Flonase  for allergies-I think reasonably well-controlled - Continue pantoprazole  for GERD-I think reasonably well-controlled -Has had recent chest x-ray so we opted not to repeat this including during the  time of his cough -Praluent  with very low chance of cough although can cause some nasopharyngitis-his symptoms seem to primarily be cough so I do not think that is related     #CAD/hyperlipidemia-history of CABG x5- follows with Dr. Lavona S: Meds: asa 81mg , praluent  -Fatigue on Repatha  in 2021-restarted on Zetia  10 mg p.o. as well but later placed back on praluent  150 mg q2 weeks -Stent drug eluting placed 04/13/19 Dr. Verlin  Lab Results  Component Value Date   CHOL 117 08/12/2023   HDL 45 08/12/2023   LDLCALC 45 08/12/2023   LDLDIRECT 115.0 06/04/2016   TRIG 161 (H) 08/12/2023   CHOLHDL 2.6 08/12/2023  -Despite cough no chest pain or shortness of breath reported A/P: CAD asymptomatic.  Lipids at goal-continue current medication   #Hypertension/CKD stage III S: Compliant with amlodipine  5 mg, hydrochlorothiazide  25 mg (trial of half tablet April 2025 ) and valsartan  320 mg- occasional lightheaded -GFR has been stable.  On ARB in case proteinuric element BP Readings from Last 3 Encounters:  12/31/23 102/62  12/22/23 (!) 114/52  12/20/23 127/70  A/P: Blood pressure running lower and reports occasionally lightheaded-we opted to trial valsartan  160 mg as long as blood pressure remains less than 130/80  # Overactive bladder-half dose of Myrbetriq  50 mg seems to be helpful-continue current treatment  Recommended follow up: Return in about 2 months (around 03/01/2024) for followup or sooner if needed.Schedule b4 you leave. Future Appointments  Date Time Provider Department Center  01/04/2024  8:45 AM Delores Elaine SQUIBB, PT [facility] None  01/07/2024  8:30 AM [facility]  01/17/2024  8:20 AM [place], PA-C [facility]  02/29/2024  8:20 AM Katrinka Garnette KIDD, MD [facility]  03/01/2024  9:15 AM Claudene Arthea HERO, DO [facility] None  04/27/2024 10:00 AM Georjean Darice HERO, MD [facility] None  06/15/2024 10:00 AM [facility] ANNUAL WELLNESS VISIT 1  [facility]    Lab/Order associations:   ICD-10-CM   1. Chronic cough  R05.3     2. Atherosclerosis of native coronary artery of native heart without angina pectoris  I25.10     3. Hyperlipidemia, unspecified hyperlipidemia type  E78.5     4. Essential hypertension  I10     5. Overactive bladder  N32.81     6. Moderate persistent asthma with acute exacerbation  J45.41       Meds ordered this encounter  Medications   fluticasone  (FLOVENT  HFA) 110 MCG/ACT inhaler    Sig: Inhale 1 puff into the lungs in the morning and at bedtime.    Dispense:  1 each    Refill:  12    Return precautions advised.  Garnette Lukes, MD

## 2024-01-02 ENCOUNTER — Ambulatory Visit: Payer: Self-pay | Admitting: Cardiology

## 2024-01-04 ENCOUNTER — Encounter: Payer: Self-pay | Admitting: Physical Therapy

## 2024-01-04 ENCOUNTER — Encounter: Admitting: Physical Therapy

## 2024-01-04 ENCOUNTER — Other Ambulatory Visit: Payer: Self-pay

## 2024-01-04 ENCOUNTER — Ambulatory Visit: Admitting: Physical Therapy

## 2024-01-04 DIAGNOSIS — M6281 Muscle weakness (generalized): Secondary | ICD-10-CM | POA: Diagnosis not present

## 2024-01-04 DIAGNOSIS — M5459 Other low back pain: Secondary | ICD-10-CM | POA: Diagnosis not present

## 2024-01-04 NOTE — Therapy (Signed)
 OUTPATIENT PHYSICAL THERAPY TREATMENT   Patient Name: Joshua [name] MRN: [id number] DOB:23-Dec-1940, 83 y.o., male Today's Date: 01/04/2024   END OF SESSION:  PT End of Session - 01/04/24 0852     Visit Number 2    Number of Visits 9    Date for Recertification  02/18/24    Authorization Type [employer]    Progress Note Due on Visit 10    PT Start Time 0846    PT Stop Time 0930    PT Time Calculation (min) 44 min    Activity Tolerance Patient tolerated treatment well    Behavior During Therapy [facility] for tasks assessed/performed           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   Blood transfusion without reported diagnosis 11/1999   CAD (coronary artery disease)    CABG 2001   Depression    Diverticulosis    DJD (degenerative joint disease)    BACK   GERD (gastroesophageal reflux disease)    H/O inguinal hernia repair 12/2018   History of gout    Hyperlipidemia    Hypertension    IBS (irritable bowel syndrome)    Obesity    OSA (obstructive sleep apnea)    USES CPAP    Pancreatitis ~ 1980   Peripheral vascular disease    Pre-diabetes    Prostate cancer (HCC) 04/2010   Sleep apnea    Past Surgical History:  Procedure Laterality Date   ANTERIOR CERVICAL DECOMP/DISCECTOMY FUSION  05/26/2006   PLATE PLACED   BACK SURGERY     CARDIAC CATHETERIZATION  11/20/1999   CORONARY ARTERY BYPASS GRAFT  11/20/1999   SVG-RI1-RI2, SVG-OM, SVG-dRCA   CORONARY STENT INTERVENTION N/A 04/13/2019   Procedure: CORONARY STENT INTERVENTION;  Surgeon: Verlin Lonni BIRCH, MD;  [facility]: [facility];  Service: Cardiovascular;  Laterality: N/A;   HERNIA REPAIR     INGUINAL HERNIA REPAIR Left 01/02/2019   Procedure: OPEN LEFT INGUINAL HERNIA  REPAIR WITH MESH;  Surgeon: Gladis Cough, MD;  Location: [facility];  Service: General;  Laterality: Left;   IR EMBO ARTERIAL NOT HEMORR HEMANG INC GUIDE ROADMAPPING  03/08/2023   IR RADIOLOGIST EVAL & MGMT  02/05/2023   IR RADIOLOGIST EVAL & MGMT  04/05/2023   IR RADIOLOGIST EVAL & MGMT  04/09/2023   IR RADIOLOGIST EVAL & MGMT  04/30/2023   IR RADIOLOGIST EVAL & MGMT  12/13/2023   JOINT REPLACEMENT  Left knee   KNEE ARTHROSCOPY Right 1992; 04/2008   LAPAROSCOPIC CHOLECYSTECTOMY  2007   LEFT HEART CATH AND CORS/GRAFTS ANGIOGRAPHY N/A 04/13/2019   Procedure: LEFT HEART CATH AND CORS/GRAFTS ANGIOGRAPHY;  Surgeon: Verlin Lonni BIRCH, MD;  [facility]: [facility];  Service: Cardiovascular;  Laterality: N/A;   NASAL SEPTUM SURGERY Right 2007   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   ROBOT ASSISTED LAPAROSCOPIC RADICAL PROSTATECTOMY  04/2010   SPINE SURGERY     THYROIDECTOMY, PARTIAL  1980   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   Procedure: LEFT TOTAL KNEE ARTHROPLASTY;  Surgeon: Sheril Coy, MD;  Location: [facility];  Service: Orthopedics;  Laterality: Left;   Patient Active Problem List   Diagnosis Date Noted   Scrotal  pain 02/11/2023   De Quervain's tenosynovitis, right 10/28/2022   Calcium  oxalate crystals in urine 07/30/2022   Balanitis 07/06/2022   Urinary frequency 07/06/2022   Degenerative arthritis of right knee 06/08/2022   Left hamstring injury 04/21/2022   Degenerative cervical disc 01/12/2022   Greater trochanteric bursitis of right hip 02/17/2021   Hyperactive bowel sounds 01/29/2021   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 09/25/2020   Primary osteoarthritis of left knee 06/18/2020   Trigger thumb, left thumb 01/24/2020   Overactive bladder 11/24/2019   Left lateral epicondylitis 09/05/2019   Greater trochanteric bursitis of left hip 10/27/2018   Degenerative disc disease, lumbar 10/27/2018   Abdominal muscle strain, initial encounter  06/16/2018   Aortic atherosclerosis 05/05/2018   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   Hyperglycemia 10/07/2017   Weakness of left leg 06/21/2017   DJD (degenerative joint disease)    Arthritis    Neck pain 02/08/2017   Venous insufficiency 12/29/2016   Ganglion cyst of tendon sheath of right hand 12/29/2016   Gout 11/20/2016   Obesity, Class I, BMI 30-34.9 10/03/2015   Hereditary and idiopathic peripheral neuropathy 09/25/2014   Allergic rhinitis 05/31/2014   Fatigue 11/09/2013   CKD (chronic kidney disease), stage III (HCC) 10/23/2013   Dizziness 08/23/2013   Neuropathy (HCC) 08/21/2013   Chronic bilateral low back pain with left-sided sciatica 10/09/2011   History of bladder cancer 04/01/2010   Localized osteoarthrosis, lower leg 10/04/2009   Generalized abdominal pain 01/31/2009   Tinnitus 05/05/2007   Essential hypertension 02/21/2007   OSA (obstructive sleep apnea) 11/02/2006   Hyperlipidemia 10/12/2006   Coronary atherosclerosis 10/12/2006   Asthma 10/12/2006   GERD 10/12/2006    PCP: Katrinka Garnette KIDD, MD  REFERRING PROVIDER: Joane Artist RAMAN, MD  REFERRING DIAG: Chronic bilateral low back pain without sciatica  Rationale for Evaluation and Treatment: Rehabilitation  THERAPY DIAG:  Other low back pain  Muscle weakness (generalized)  ONSET DATE: Chronic   SUBJECTIVE SUBJECTIVE STATEMENT: Patient reports his back continues to bother him and his right knee has started bothering him again.   Eval: Patient reports he has had back pain for a long time with no mechanism of injury. He tried pool therapy at least 3 years ago and he didn't like that. Today his back pain is good, most days are ok, and then occasionally he has severe pain. He thinks lifting heavy objects or combine squatting and bending to clean the litter box causes his pain to get worse. He reports difficulty getting out of bed in the morning. Pain is located to the lower back, across the back and worse on the  left side. He has had epidurals in his back that worked for a while and then stopped working. He does report painful peripheral neuropathy in his feet that seems to affect his balance.   PERTINENT HISTORY:  See PMH above  PAIN:  Are you having pain? Yes:  NPRS scale: 3/10 currently, 7/10 at worst Pain location: Lower back Pain description: pushing type of pain Aggravating factors: Lifting, squatting and bending Relieving factors: Massage, heat, Tylenol   PRECAUTIONS: None  PATIENT GOALS: Improve pain   OBJECTIVE:  Note: Objective measures were completed at Evaluation unless otherwise noted. PATIENT SURVEYS:  Modified Oswestry: 24% disability (12/50)  MUSCLE LENGTH: Limitation   POSTURE:  Decreased lumbar lordosis, rounded shoulder posture, forward trunk lean  PALPATION: Tender to palpation across lumbar region, L > R  LUMBAR ROM:   AROM eval  Flexion 25%  Extension 50%  Right lateral flexion 25%  Left lateral flexion 25%  Right rotation 50%  Left rotation 25%   (Blank rows = not tested)  LOWER EXTREMITY ROM:      Hip PROM grossly WFL  LOWER EXTREMITY MMT:    MMT Right eval Left eval  Hip flexion 4 4-  Hip extension 3- 3-  Hip abduction 3- 3-  Hip adduction    Hip internal rotation    Hip external rotation    Knee flexion 4+ 4+  Knee extension 4+ 4  Ankle dorsiflexion    Ankle plantarflexion    Ankle inversion    Ankle eversion     (Blank rows = not tested)  LUMBAR SPECIAL TESTS:  Lumbar radicular testing negative  FUNCTIONAL TESTS:  5 times sit to stand: 18 seconds  GAIT: Assistive device utilized: None Level of assistance: Complete Independence Comments: Forward trunk lean, wide BOS   TREATMENT [facility] Adult PT Treatment:                                                DATE: 01/04/2024 Recumbent bike L4 x 5 min to improve endurance and workload capacity Prone IASTM using theragun for lumbar paraspinals and gluteal region Prone lumbar PA  mobs Prone glute/piriformis muscle release with hip IR/ER Supine passive knee to chest, piriformis and hamstring stretch Hooklying clamshell with blue 2 x 10 LTR 10 x 5 sec each Seated hamstring stretch 2 x 20 sec each Seated lumbar flexion stretch with stability 5 x 5 sec  PATIENT EDUCATION:  Education details: HEP Person educated: Patient Education method: Programmer, multimedia, Demonstration, Actor cues, Verbal cues Education comprehension: verbalized understanding, returned demonstration, verbal cues required, tactile cues required, and needs further education  HOME EXERCISE PROGRAM: Access Code: [email or web address]    ASSESSMENT: CLINICAL IMPRESSION: Patient tolerated therapy well with no adverse effects. Therapy focused primarily on improving his lumbar and hip mobility. Utilized manual therapy to reduce muscular tension and improve lumbar joint mobility. Continued with lower back and hamstring stretching with good tolerance. He reports consistency with his HEP and no changes made this visit. Patient would benefit from continued skilled PT to progress mobility and strength in order to reduce pain and maximize functional ability.   Eval: Patient is a 83 y.o. male who was seen today for physical therapy evaluation and treatment for chronic lower back pain. He exhibits limitations in his lumbar mobility, hip and hamstring flexibility, and gross strength deficits with postural deviations that are contributing to his pain and impacting his functional ability.   OBJECTIVE IMPAIRMENTS: Abnormal gait, decreased activity tolerance, decreased balance, decreased ROM, decreased strength, impaired flexibility, postural dysfunction, and pain.   ACTIVITY LIMITATIONS: carrying, lifting, bending, standing, squatting, sleeping, and locomotion level  PARTICIPATION LIMITATIONS: cleaning and community activity  PERSONAL FACTORS: Fitness, Past/current experiences, and Time since onset of injury/illness/exacerbation are  also affecting patient's functional outcome.    GOALS: Goals reviewed with patient? No  SHORT TERM GOALS: Target date: 01/21/2024  Patient will be I with initial HEP in order to progress with therapy. Baseline: HEP provided at eval Goal status: INITIAL  2.  Patient will report low back pain </= 4/10 at worst in order to reduce functional limitations Baseline: 7/10 Goal status: INITIAL  LONG TERM GOALS: Target date: 02/18/2024  Patient will be I with final  HEP to maintain progress from PT. Baseline: HEP provided at eval Goal status: INITIAL  2.  Patient will report Modified Oswestry </= 6/50 (12% disability) in order to indicate an improvement in their functional status Baseline: 24% Goal status: INITIAL  3.  Patient will demonstrate a 25% improvement in his lumbar motion in order to improve his ability to bend and clean the litter box at home Baseline: see limitations above Goal status: INITIAL  4.  Patient will exhibit hip strength >/= 4-/5 MMT in order to improve standing, walking, and lifting tolerance Baseline: grossly 3-/5 MMT Goal status: INITIAL  5. Patient will demonstrate 5xSTS </= 12 seconds in order to indicate improved LE strength  Baseline: 18 seconds  Goal status: INITIAL   PLAN: PT FREQUENCY: 1x/week  PT DURATION: 8 weeks  PLANNED INTERVENTIONS: 97164- PT Re-evaluation, 97750- Physical Performance Testing, 97110-Therapeutic exercises, 97530- Therapeutic activity, 97112- Neuromuscular re-education, 97535- Self Care, 02859- Manual therapy, 20560 (1-2 muscles), 20561 (3+ muscles)- Dry Needling, Patient/Family education, Joint mobilization, Joint manipulation, Spinal manipulation, Spinal mobilization, Cryotherapy, and Moist heat.  PLAN FOR NEXT SESSION: Review HEP and progress PRN, manual/stretching for lumbar spine, hips, and hamstring, progress core stabilization and hip/LE strengthening as tolerated; further assessment of balance   Elaine Daring, PT,  DPT, LAT, ATC 01/04/24  9:45 AM Phone: [phone] Fax: [phone]

## 2024-01-07 ENCOUNTER — Inpatient Hospital Stay: Admission: RE | Admit: 2024-01-07 | Source: Ambulatory Visit

## 2024-01-11 ENCOUNTER — Encounter: Admitting: Physical Therapy

## 2024-01-11 DIAGNOSIS — J3089 Other allergic rhinitis: Secondary | ICD-10-CM | POA: Diagnosis not present

## 2024-01-11 DIAGNOSIS — J3081 Allergic rhinitis due to animal (cat) (dog) hair and dander: Secondary | ICD-10-CM | POA: Diagnosis not present

## 2024-01-11 DIAGNOSIS — J4530 Mild persistent asthma, uncomplicated: Secondary | ICD-10-CM | POA: Diagnosis not present

## 2024-01-11 DIAGNOSIS — J301 Allergic rhinitis due to pollen: Secondary | ICD-10-CM | POA: Diagnosis not present

## 2024-01-11 DIAGNOSIS — G4733 Obstructive sleep apnea (adult) (pediatric): Secondary | ICD-10-CM | POA: Diagnosis not present

## 2024-01-12 ENCOUNTER — Telehealth: Payer: Self-pay

## 2024-01-12 NOTE — H&P (Signed)
 Chief Complaint: Patient was seen in consultation today for right knee pain   Referring Physician(s): Artist Lloyd, MD   Supervising Physician: Jennefer Rover  Patient Status: [name] - Outpatient   History of Present Illness: Joshua [name] is an 83 y.o. male with a history of bilateral knee osteoarthritis status post left total knee arthoplasty in 2022 with persistent, worsening pain since the operation [facility] 57/96). He underwent a technically successful left geniculate artery embolization 03/08/23. His pain significantly improved after the procedure.   He followed up with Dr. Jennefer 12/13/23 and reported near complete resolution of his left knee pain. His main complaint is now the right knee which he rates a 7/10. He has to ice the knee frequently and the pain prevents him from sleeping. He is limited on pain medications he can take due to his co-morbidities. He would like to avoid surgery on the right knee and he requested a right geniculate artery embolization.   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   Blood transfusion without reported diagnosis 11/1999   CAD (coronary artery disease)    CABG 2001   Depression    Diverticulosis    DJD (degenerative joint disease)    BACK   GERD (gastroesophageal reflux disease)    H/O inguinal hernia repair 12/2018   History of gout    Hyperlipidemia    Hypertension    IBS (irritable bowel syndrome)    Obesity    OSA (obstructive sleep apnea)    USES CPAP    Pancreatitis ~ 1980   Peripheral vascular disease    Pre-diabetes    Prostate cancer (HCC) 04/2010   Sleep apnea     Past Surgical History:  Procedure Laterality Date   ANTERIOR CERVICAL DECOMP/DISCECTOMY FUSION  05/26/2006   PLATE PLACED   BACK  SURGERY     CARDIAC CATHETERIZATION  11/20/1999   CORONARY ARTERY BYPASS GRAFT  11/20/1999   SVG-RI1-RI2, SVG-OM, SVG-dRCA   CORONARY STENT INTERVENTION N/A 04/13/2019   Procedure: CORONARY STENT INTERVENTION;  Surgeon: Verlin Lonni BIRCH, MD;  [facility]: [facility];  Service: Cardiovascular;  Laterality: N/A;   HERNIA REPAIR     INGUINAL HERNIA REPAIR Left 01/02/2019   Procedure: OPEN LEFT INGUINAL HERNIA REPAIR WITH MESH;  Surgeon: Gladis Cough, MD;  Location: [facility];  Service: General;  Laterality: Left;   IR EMBO ARTERIAL NOT HEMORR HEMANG INC GUIDE ROADMAPPING  03/08/2023   IR RADIOLOGIST EVAL & MGMT  02/05/2023   IR RADIOLOGIST EVAL & MGMT  04/05/2023   IR RADIOLOGIST EVAL & MGMT  04/09/2023   IR RADIOLOGIST EVAL & MGMT  04/30/2023   IR RADIOLOGIST EVAL & MGMT  12/13/2023   JOINT REPLACEMENT  Left knee   KNEE ARTHROSCOPY Right 1992; 04/2008   LAPAROSCOPIC CHOLECYSTECTOMY  2007   LEFT HEART CATH AND CORS/GRAFTS ANGIOGRAPHY N/A 04/13/2019   Procedure: LEFT HEART CATH AND CORS/GRAFTS ANGIOGRAPHY;  Surgeon: Verlin Lonni BIRCH, MD;  Location: [facility];  Service: Cardiovascular;  Laterality: N/A;   NASAL SEPTUM SURGERY Right 2007   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   ROBOT ASSISTED LAPAROSCOPIC RADICAL PROSTATECTOMY  04/2010   SPINE SURGERY     THYROIDECTOMY,  PARTIAL  1980   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   Procedure: LEFT TOTAL KNEE ARTHROPLASTY;  Surgeon: Sheril Coy, MD;  Location: [facility];  Service: Orthopedics;  Laterality: Left;    Allergies: Codeine, Lexapro  [escitalopram ], Bromfed, Clarithromycin, Cymbalta  [duloxetine  hcl], Doxycycline hyclate, Modafinil, Oxycodone -acetaminophen , Promethazine hcl, Quinolones, Telithromycin, Tramadol , Benadryl [diphenhydramine], Hydrocodone -acetaminophen , Repatha  [evolocumab ], Statins, and Sulfonamide derivatives  Medications: Prior to Admission medications   Medication Sig Start Date End Date Taking?  Authorizing Provider  acetaminophen  (TYLENOL ) 500 MG tablet Take 1,000 mg by mouth every 6 (six) hours as needed for moderate pain or headache.    [provider]  albuterol  (PROAIR  HFA) 108 (90 Base) MCG/ACT inhaler Inhale 1-2 puffs into the lungs every 6 (six) hours as needed for wheezing or shortness of breath. 12/06/21   Lucius Krabbe, NP  Alirocumab  (PRALUENT ) 150 MG/ML SOAJ INJECT CONTENTS OF 1 PEN (150MG ) INTO THE SKIN EVERY 14 DAYS 12/28/23   Lavona Agent, MD  amLODipine  (NORVASC ) 5 MG tablet TAKE 1 TABLET (5 MG TOTAL) BY MOUTH DAILY. 02/03/23   Mona Vinie BROCKS, MD  aspirin  EC 81 MG tablet Take 1 tablet (81 mg total) by mouth 2 (two) times daily after a meal. 06/18/20   Lenis Barter, PA-C  cholecalciferol (VITAMIN D3) 25 MCG (1000 UNIT) tablet Take 1,000 Units by mouth daily.    [provider]  Cyanocobalamin  (B-12) 2500 MCG TABS Take 1,250 mcg by mouth daily.    [provider]  diazepam  (VALIUM ) 5 MG tablet TAKE 1 TABLET (5 MG TOTAL) BY MOUTH AT BEDTIME AS NEEDED (SLEEP). 04/16/23   Katrinka Garnette KIDD, MD  EPINEPHrine  0.3 mg/0.3 mL IJ SOAJ injection Inject 0.3 mg into the muscle as needed for anaphylaxis. for allergic reaction 04/24/19   [provider]  ezetimibe  (ZETIA ) 10 MG tablet TAKE 1 TABLET BY MOUTH EVERY DAY 10/04/23   Lavona Agent, MD  fluticasone  (FLONASE ) 50 MCG/ACT nasal spray Place 1 spray into both nostrils 2 (two) times daily. 11/09/23   [provider]  fluticasone  (FLOVENT  HFA) 110 MCG/ACT inhaler Inhale 1 puff into the lungs in the morning and at bedtime. 12/31/23   Katrinka Garnette KIDD, MD  folic acid  (FOLVITE ) 800 MCG tablet Take 800 mcg by mouth daily.     [provider]  gabapentin  (NEURONTIN ) 300 MG capsule Take 2 cap in AM, 1 cap at noon, 3 caps at bedtime 05/26/23   Georjean Darice HERO, MD  hydrochlorothiazide  (HYDRODIURIL ) 25 MG tablet TAKE 1 TABLET BY MOUTH EVERY DAY 10/04/23   Katrinka Garnette KIDD, MD  ketoconazole  (NIZORAL) 2 % cream Apply 1 Application topically 2 (two) times daily as needed. 01/25/23   [provider]  Loratadine  (CLARITIN  PO) Take by mouth at bedtime.    [provider]  MYRBETRIQ  50 MG TB24 tablet TAKE 1 TABLET BY MOUTH EVERY DAY (REPLACES GEMTESA ) 08/30/23   Katrinka Garnette KIDD, MD  nitroGLYCERIN  (NITROSTAT ) 0.4 MG SL tablet Place 1 tablet (0.4 mg total) under the tongue every 5 (five) minutes as needed for chest pain. 12/11/22   Katrinka Garnette KIDD, MD  pantoprazole  (PROTONIX ) 40 MG tablet TAKE 1 TABLET BY MOUTH EVERY DAY 04/12/23   Hilty, Vinie BROCKS, MD  polyethylene glycol powder (GLYCOLAX /MIRALAX ) powder Take 17 g by mouth daily. 08/29/14   Agent Anes, MD  valsartan  (DIOVAN ) 320 MG tablet TAKE 1 TABLET BY MOUTH EVERY DAY 06/25/23   Katrinka Garnette KIDD, MD     Family History  Problem Relation Age  of Onset   Heart disease Mother    Hyperlipidemia Mother    COPD Mother    Heart disease Father    Hyperlipidemia Father    Alcohol abuse Father    Obesity Father    Diabetes Brother    Other Brother        accidental injury leading to long term GI complications- seerious surgeyr 2025 planned   Dementia Brother        incontinence   Colon cancer Neg Hx    Pancreatic cancer Neg Hx    Esophageal cancer Neg Hx    Stomach cancer Neg Hx    Liver cancer Neg Hx     Social History   Socioeconomic History   Marital status: Married    Spouse name: Not on file   Number of children: Not on file   Years of education: Not on file   Highest education level: Associate degree: occupational, Scientist, product/process development, or vocational program  Occupational History   Occupation: retired    Associate Professor: RETIRED  Tobacco Use   Smoking status: Former    Current packs/day: 0.00    Average packs/day: 1 pack/day for 35.0 years (35.0 ttl pk-yrs)    Types: Cigarettes    Start date: 06/16/1957    Quit date: 06/16/1992    Years since quitting: 31.6    Passive exposure: Past   Smokeless tobacco: Never  Vaping  Use   Vaping status: Never Used  Substance and Sexual Activity   Alcohol use: Yes    Alcohol/week: 2.0 standard drinks of alcohol    Types: 2 Shots of liquor per week    Comment: occ   Drug use: No   Sexual activity: Not Currently    Partners: Female  Other Topics Concern   Not on file  Social History Narrative   Married 42 years in 2015. 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      Hobbies: poker, movies and tv, staying active      Right handed   2 level home with steps he uses   Social Drivers of Corporate investment banker Strain: Low Risk  (10/25/2023)   Overall Financial Resource Strain (CARDIA)    Difficulty of Paying Living Expenses: Not hard at all  Food Insecurity: No Food Insecurity (10/25/2023)   Hunger Vital Sign    Worried About Running Out of Food in the Last Year: Never true    Ran Out of Food in the Last Year: Never true  Transportation Needs: No Transportation Needs (10/25/2023)   PRAPARE - Administrator, Civil Service (Medical): No    Lack of Transportation (Non-Medical): No  Physical Activity: Inactive (10/25/2023)   Exercise Vital Sign    Days of Exercise per Week: 0 days    Minutes of Exercise per Session: Not on file  Stress: No Stress Concern Present (10/25/2023)   [employer] of Occupational Health - Occupational Stress Questionnaire    Feeling of Stress: Only a little  Social Connections: Moderately Isolated (10/25/2023)   Social Connection and Isolation Panel    Frequency of Communication with Friends and Family: More than three times a week    Frequency of Social Gatherings with Friends and Family: Three times a week    Attends Religious Services: Never    Active Member of Clubs or Organizations: No    Attends Club or  Organization Meetings: Not on file    Marital Status: Married    Review of Systems: A 12 point ROS discussed and pertinent positives are indicated in  the HPI above.  All other systems are negative.  Review of Systems  Cardiovascular:  Positive for leg swelling.  Gastrointestinal:  Positive for diarrhea and nausea.  Musculoskeletal:  Positive for arthralgias and myalgias.  All other systems reviewed and are negative.   Vital Signs: There were no vitals taken for this visit.  Physical Exam Constitutional:      General: He is not in acute distress.    Appearance: He is not ill-appearing.  Cardiovascular:     Rate and Rhythm: Normal rate.  Pulmonary:     Effort: Pulmonary effort is normal.  Musculoskeletal:        General: Tenderness present.     Left lower leg: Edema present.     Comments: Right knee pain   Skin:    General: Skin is warm and dry.  Neurological:     Mental Status: He is alert and oriented to person, place, and time.  Psychiatric:        Behavior: Behavior normal.        Thought Content: Thought content normal.        Judgment: Judgment normal.     Labs:  CBC: Recent Labs    05/25/23 0954 07/10/23 1254 10/27/23 1233 12/17/23 1316  WBC 7.9 7.9 5.8 7.9  HGB 14.5 14.9 14.2 14.6  HCT 42.0 41.8 41.4 44.0  PLT 256.0 207 215.0 211    COAGS: No results for input(s): INR, APTT in the last 8760 hours.  BMP: Recent Labs    03/18/23 0725 05/25/23 0954 07/10/23 1254 10/27/23 1233 12/17/23 1316  NA 134* 138 139 134* 137  K 3.5 3.7 3.9 4.2 4.9  CL 99 102 104 98 98  CO2 24 28 26 29 22   GLUCOSE 154* 96 89 96 115*  BUN 16 22 17 20 18   CALCIUM  9.0 10.0 9.6 9.6 10.0  CREATININE 1.31* 1.24 1.24 1.21 1.28*  GFRNONAA 55*  --  59*  --   --     LIVER FUNCTION TESTS: Recent Labs    05/25/23 0954 07/10/23 1254 10/27/23 1233 12/17/23 1316  BILITOT 0.7 0.7 0.8 0.9  AST 18 21 15 15   ALT 19 21 17 17   ALKPHOS 51 58 50 75  PROT 6.7 6.6 6.9 6.7  ALBUMIN 4.0 3.6 4.3 4.3    TUMOR MARKERS: No results for input(s): AFPTM, CEA, CA199, CHROMGRNA in the last 8760 hours.  Assessment and  Plan:  Right knee pain: Joshua [name], 83 year old male, presents today for an image-guided right geniculate artery embolization.   Risks and benefits of this procedure were discussed with the patient including, but not limited to bleeding, infection, vascular injury or contrast induced renal failure.  All of the patient's questions were answered, patient is agreeable to proceed. He has been NPO.   Consent signed and in chart.  Thank you for this interesting consult.  I greatly enjoyed meeting Joshua [name] and look forward to participating in their care.  A copy of this report was sent to the requesting provider on this date.  Electronically Signed: Warren Dais, AGACNP-BC 01/14/2024, 8:19 AM   I spent a total of  30 Minutes   in face to face in clinical consultation, greater than 50% of which was counseling/coordinating care for right knee pain.

## 2024-01-12 NOTE — Progress Notes (Signed)
 See telephone note  Spoke with Dr. Jennefer, no need for post procedure medrol  pack, pt had steroid injection on 12/22/2023, we will continue with IV decadron  pre procedure on 01/14/24

## 2024-01-12 NOTE — Discharge Instructions (Signed)
 Discharge Instructions for Geniculate Artery Embolization (GAE)     Rest for the remainder of the day.  Avoid strenuous activities and heavy lifting for 48 hours. Gradually resume normal activities as tolerated.   You may experience mild pain or discomfort at the catheter insertion site or in the knee. This is normal. Use over-the-counter pain relievers such as acetaminophen  (Tylenol ) or ibuprofen (Advil) as directed. Apply an ice pack to the knee for 15-20 minutes every 2-3 hours to reduce swelling and discomfort. Take the Medrol  Dosepak as directed per pharmacy.   Keep the catheter insertion site clean and dry. Remove the bandage after 24 hours and replace it with a clean, dry bandage if needed. Avoid soaking in baths, hot tubs, or swimming pools for 5 days. Showers are allowed.   Take any prescribed medications as directed. If you were taking blood thinners, follow your physician's instructions on when to resume them.   Resume your normal diet. Drink plenty of fluids to stay hydrated.   Schedule a follow-up appointment with your physician as instructed.   Call our office at [phone] with any concerns, including if you experience increased pain, swelling, warmth, redness, or drainage at the insertion site, if you have a fever over 100.70F (38C), or if you experience sudden weakness or numbness in the treated leg.   Please ensure you follow these instructions carefully and reach out to your healthcare provider if you have any concerns or questions. Wishing you a smooth and speedy recovery!    If you need to speak to someone after hours or on the weekend, please call the Interventional Radiologist on-call service at [phone]. Tell them you are a patient of Dr. Terrill and that you had a GAE today, along with any issues you are having.    Thank you for visiting [facility]!

## 2024-01-13 DIAGNOSIS — L821 Other seborrheic keratosis: Secondary | ICD-10-CM | POA: Diagnosis not present

## 2024-01-14 ENCOUNTER — Ambulatory Visit
Admission: RE | Admit: 2024-01-14 | Discharge: 2024-01-14 | Disposition: A | Discharge status: Home / Self Care | Source: Ambulatory Visit | Attending: Interventional Radiology | Admitting: Interventional Radiology

## 2024-01-14 DIAGNOSIS — M1711 Unilateral primary osteoarthritis, right knee: Secondary | ICD-10-CM

## 2024-01-14 HISTORY — PX: IR EMBO ARTERIAL NOT HEMORR HEMANG INC GUIDE ROADMAPPING: IMG5448

## 2024-01-14 MED ORDER — DEXAMETHASONE SOD PHOSPHATE PF 10 MG/ML IJ SOLN
10.0000 mg | Freq: Once | INTRAMUSCULAR | Status: AC
  Administered 2024-01-14: 10 mg via INTRAVENOUS

## 2024-01-14 MED ORDER — MIDAZOLAM HCL (PF) 2 MG/2ML IJ SOLN: 1.0000 mg | INTRAMUSCULAR | Status: DC | PRN

## 2024-01-14 MED ORDER — MIDAZOLAM HCL (PF) 2 MG/2ML IJ SOLN
INTRAMUSCULAR | Status: AC | PRN
  Administered 2024-01-14: 1 mg via INTRAVENOUS

## 2024-01-14 MED ORDER — KETOROLAC TROMETHAMINE 30 MG/ML IJ SOLN
30.0000 mg | INTRAMUSCULAR | Status: AC
  Administered 2024-01-14: 30 mg via INTRAVENOUS

## 2024-01-14 MED ORDER — FENTANYL CITRATE (PF) 100 MCG/2ML IJ SOLN
INTRAMUSCULAR | Status: AC | PRN
  Administered 2024-01-14: 50 ug via INTRAVENOUS

## 2024-01-14 MED ORDER — NITROGLYCERIN 1 MG/10 ML FOR IR/CATH LAB
100.0000 ug | INTRA_ARTERIAL | Status: DC | PRN
  Administered 2024-01-14: 100 ug via INTRA_ARTERIAL

## 2024-01-14 MED ORDER — FENTANYL CITRATE (PF) 50 MCG/ML IJ SOSY: 25.0000 ug | PREFILLED_SYRINGE | INTRAMUSCULAR | Status: DC | PRN

## 2024-01-14 MED ORDER — SODIUM CHLORIDE 0.9 % IV SOLN: INTRAVENOUS | Status: DC

## 2024-01-14 MED ORDER — ACETAMINOPHEN 10 MG/ML IV SOLN
1000.0000 mg | Freq: Once | INTRAVENOUS | Status: AC
  Administered 2024-01-14: 1000 mg via INTRAVENOUS

## 2024-01-14 MED ORDER — IIOPAMIDOL (ISOVUE-250) INJECTION 51%
70.0000 mL | Freq: Once | INTRAVENOUS | Status: AC | PRN
  Administered 2024-01-14: 70 mL via INTRA_ARTERIAL

## 2024-01-14 MED ORDER — LIDOCAINE-EPINEPHRINE 1 %-1:100000 IJ SOLN
10.0000 mL | Freq: Once | INTRAMUSCULAR | Status: AC
  Administered 2024-01-14: 10 mL via INTRADERMAL

## 2024-01-16 NOTE — Progress Notes (Unsigned)
 Joshua Console, Joshua [name] [address] [place], [place]  [address] Phone: [phone]   Primary Care Physician: Katrinka Garnette KIDD, MD  Primary Gastroenterologist:  Joshua Console, Joshua [name] / Dr. Gordy Starch   Chief Complaint: Follow-up irritable bowel syndrome and irregular bowel movements and constipation   HPI:   Discussed the use of AI scribe software for clinical note transcription with the patient, who gave verbal consent to proceed.  History of Present Illness   He returns for 35-month follow-up.  Last seen in our office 08/2023 for follow-up of irritable bowel syndrome, constipation predominant.  He has tried multiple treatments in the past including MiraLAX , Benefiber, probiotic, Linzess , Amitiza , IBgard, dicyclomine , and Ibsrela.  None of these treatments have helped.  Patient tried pelvic floor physical therapy.  4 months ago he was given trial of Motegrity .  Consider amitriptyline  (TCA) versus Cymbalta  (SNRI) at low-dose if IBS symptoms persisted.  He has history of adenomatous colon polyps.  Also has history of hiatal hernia and GERD.  Takes pantoprazole  40 mg daily.  Previous workup included abdominal pelvic CT and colonoscopy which were unrevealing.  04/2021 colonoscopy by Dr. Starch: 3 tubular adenomas and 1 hyperplastic polyp removed (4 mm to 9 mm).  Diverticulosis.  Internal hemorrhoids.  2010 EGD: Hiatal hernia, otherwise normal.  2010 colonoscopy: No polyps.  Current Outpatient Medications  Medication Sig Dispense Refill   acetaminophen  (TYLENOL ) 500 MG tablet Take 1,000 mg by mouth every 6 (six) hours as needed for moderate pain or headache.     albuterol  (PROAIR  HFA) 108 (90 Base) MCG/ACT inhaler Inhale 1-2 puffs into the lungs every 6 (six) hours as needed for wheezing or shortness of breath. 18 g 5   Alirocumab  (PRALUENT ) 150 MG/ML SOAJ INJECT CONTENTS OF 1 PEN (150MG ) INTO THE SKIN EVERY 14 DAYS 2 mL 11   amLODipine  (NORVASC ) 5 MG tablet TAKE 1 TABLET (5 MG  TOTAL) BY MOUTH DAILY. 90 tablet 3   aspirin  EC 81 MG tablet Take 1 tablet (81 mg total) by mouth 2 (two) times daily after a meal. 30 tablet 11   cholecalciferol (VITAMIN D3) 25 MCG (1000 UNIT) tablet Take 1,000 Units by mouth daily.     Cyanocobalamin  (B-12) 2500 MCG TABS Take 1,250 mcg by mouth daily.     diazepam  (VALIUM ) 5 MG tablet TAKE 1 TABLET (5 MG TOTAL) BY MOUTH AT BEDTIME AS NEEDED (SLEEP). 30 tablet 0   EPINEPHrine  0.3 mg/0.3 mL IJ SOAJ injection Inject 0.3 mg into the muscle as needed for anaphylaxis. for allergic reaction     ezetimibe  (ZETIA ) 10 MG tablet TAKE 1 TABLET BY MOUTH EVERY DAY 90 tablet 2   fluticasone  (FLONASE ) 50 MCG/ACT nasal spray Place 1 spray into both nostrils 2 (two) times daily.     fluticasone  (FLOVENT  HFA) 110 MCG/ACT inhaler Inhale 1 puff into the lungs in the morning and at bedtime. 1 each 12   folic acid  (FOLVITE ) 800 MCG tablet Take 800 mcg by mouth daily.      gabapentin  (NEURONTIN ) 300 MG capsule Take 2 cap in AM, 1 cap at noon, 3 caps at bedtime 540 capsule 3   hydrochlorothiazide  (HYDRODIURIL ) 25 MG tablet TAKE 1 TABLET BY MOUTH EVERY DAY 90 tablet 2   ketoconazole (NIZORAL) 2 % cream Apply 1 Application topically 2 (two) times daily as needed.     Loratadine  (CLARITIN  PO) Take by mouth at bedtime.     MYRBETRIQ  50 MG TB24 tablet TAKE 1  TABLET BY MOUTH EVERY DAY (REPLACES GEMTESA ) 90 tablet 1   nitroGLYCERIN  (NITROSTAT ) 0.4 MG SL tablet Place 1 tablet (0.4 mg total) under the tongue every 5 (five) minutes as needed for chest pain. 30 tablet 5   pantoprazole  (PROTONIX ) 40 MG tablet TAKE 1 TABLET BY MOUTH EVERY DAY 90 tablet 3   polyethylene glycol powder (GLYCOLAX /MIRALAX ) powder Take 17 g by mouth daily. 255 g 0   valsartan  (DIOVAN ) 320 MG tablet TAKE 1 TABLET BY MOUTH EVERY DAY 90 tablet 3   No current facility-administered medications for this visit.    Allergies as of 01/17/2024 - Review Complete 01/14/2024  Allergen Reaction Noted   Codeine  Other (See Comments)    Lexapro  [escitalopram ]  08/21/2020   Bromfed Nausea And Vomiting 12/14/2006   Clarithromycin Other (See Comments) 10/12/2006   Cymbalta  [duloxetine  hcl]  01/01/2022   Doxycycline hyclate Nausea And Vomiting 12/14/2006   Modafinil Other (See Comments) 12/14/2006   Oxycodone -acetaminophen  Itching 11/25/2016   Promethazine hcl Other (See Comments) 12/14/2006   Quinolones Nausea Only 09/20/2014   Telithromycin Nausea And Vomiting 12/14/2006   Tramadol  Nausea Only 07/29/2020   Benadryl [diphenhydramine] Other (See Comments) 02/06/2014   Hydrocodone -acetaminophen  Rash 12/14/2006   Repatha  [evolocumab ]  02/16/2022   Statins Other (See Comments) 04/14/2019   Sulfonamide derivatives Rash     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   Blood transfusion without reported diagnosis 11/1999   CAD (coronary artery disease)    CABG 2001   Depression    Diverticulosis    DJD (degenerative joint disease)    BACK   GERD (gastroesophageal reflux disease)    H/O inguinal hernia repair 12/2018   History of gout    Hyperlipidemia    Hypertension    IBS (irritable bowel syndrome)    Obesity    OSA (obstructive sleep apnea)    USES CPAP    Pancreatitis ~ 1980   Peripheral vascular disease    Pre-diabetes    Prostate cancer (HCC) 04/2010   Sleep apnea     Past Surgical History:  Procedure Laterality Date   ANTERIOR CERVICAL DECOMP/DISCECTOMY FUSION  05/26/2006   PLATE PLACED   BACK SURGERY     CARDIAC CATHETERIZATION  11/20/1999   CORONARY ARTERY BYPASS GRAFT  11/20/1999   SVG-RI1-RI2, SVG-OM, SVG-dRCA   CORONARY STENT INTERVENTION N/A 04/13/2019   Procedure: CORONARY STENT INTERVENTION;  Surgeon: Verlin Lonni BIRCH, MD;  Location: [facility];  Service: Cardiovascular;  Laterality: N/A;   HERNIA REPAIR     INGUINAL HERNIA REPAIR Left 01/02/2019   Procedure: OPEN LEFT INGUINAL HERNIA REPAIR WITH MESH;  Surgeon: Gladis Cough, MD;  Location: [facility];  Service: General;  Laterality: Left;   IR EMBO ARTERIAL NOT HEMORR HEMANG INC GUIDE ROADMAPPING  03/08/2023   IR EMBO ARTERIAL NOT HEMORR HEMANG INC GUIDE ROADMAPPING  01/14/2024   IR RADIOLOGIST EVAL & MGMT  02/05/2023   IR RADIOLOGIST EVAL & MGMT  04/05/2023   IR RADIOLOGIST EVAL & MGMT  04/09/2023   IR RADIOLOGIST EVAL & MGMT  04/30/2023   IR RADIOLOGIST EVAL & MGMT  12/13/2023   JOINT REPLACEMENT  Left knee   KNEE ARTHROSCOPY Right 1992; 04/2008  LAPAROSCOPIC CHOLECYSTECTOMY  2007   LEFT HEART CATH AND CORS/GRAFTS ANGIOGRAPHY N/A 04/13/2019   Procedure: LEFT HEART CATH AND CORS/GRAFTS ANGIOGRAPHY;  Surgeon: Verlin Lonni BIRCH, MD;  Location: [facility];  Service: Cardiovascular;  Laterality: N/A;   NASAL SEPTUM SURGERY Right 2007   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   ROBOT ASSISTED LAPAROSCOPIC RADICAL PROSTATECTOMY  04/2010   SPINE SURGERY     THYROIDECTOMY, PARTIAL  1980   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   Procedure: LEFT TOTAL KNEE ARTHROPLASTY;  Surgeon: Sheril Coy, MD;  Location: [facility];  Service: Orthopedics;  Laterality: Left;    Review of Systems:    All systems reviewed and negative except where noted in HPI.    Physical Exam:  There were no vitals taken for this visit. No LMP for male patient.  General: Well-nourished, well-developed in no acute distress.  Lungs: Clear to auscultation bilaterally. Non-labored. Heart: Regular rate and rhythm, no murmurs rubs or gallops.  Abdomen: Bowel sounds are normal; Abdomen is Soft; No hepatosplenomegaly, masses or hernias;  No Abdominal Tenderness; No guarding or rebound tenderness. Neuro: Alert and oriented x 3.  Grossly intact.  Psych: Alert and cooperative, normal mood and  affect.   Imaging Studies: IR EMBO ARTERIAL NOT HEMORR HEMANG INC GUIDE ROADMAPPING Result Date: 01/14/2024 INDICATION: 83 year old male with moderate to severe right knee pain secondary to advanced osteoarthritis refractory to conservative measures. EXAM: 1. Ultrasound-guided vascular access of the right superficial femoral artery. 2. Limited right lower extremity angiogram. 3. Selective catheterization and angiography of the right descending and superomedial geniculate arteries. 4. Particle embolization of the right superomedial geniculate artery. MEDICATIONS: None. ANESTHESIA/SEDATION: Moderate (conscious) sedation was employed during this procedure. A total of Versed  1 mg and Fentanyl  50 mcg was administered intravenously. Moderate Sedation Time: 43 minutes. The patient's level of consciousness and vital signs were monitored continuously by radiology nursing throughout the procedure under my direct supervision. CONTRAST:  70 mL Isovue  250, intra arterial FLUOROSCOPY: Radiation Exposure Index (as provided by the fluoroscopic device): 227 mGy reference air Kerma COMPLICATIONS: None immediate. PROCEDURE: Informed consent was obtained from the patient following explanation of the procedure, risks, benefits and alternatives. The patient understands, agrees and consents for the procedure. All questions were addressed. A time out was performed prior to the initiation of the procedure. Maximal barrier sterile technique utilized including caps, mask, sterile gowns, sterile gloves, large sterile drape, hand hygiene, and Betadine  prep. Preprocedure ultrasound evaluation demonstrated patency of the proximal right superficial femoral artery. The procedure was planned. Subdermal Local anesthesia was administered at the planned needle entry site with 1% lidocaine . A small skin nick was made. Deeper local anesthetic was administered to the level of the proximal superficial femoral artery. Under direct ultrasound  visualization, a 21 gauge micropuncture needle was advanced into the superficial femoral artery in antegrade fashion. A permanent ultrasound image was captured and stored in the record. A micropuncture sheath was then introduced through which an Amplatz wire was advanced under fluoroscopic visualization the distal superficial femoral artery. The micropuncture sheath was exchanged for a 4 French C2 glide catheter which was positioned in the mid to distal superficial femoral artery. Limited angiogram was performed which demonstrated patency of the superficial femoral and popliteal arteries. An early branching anterior tibial artery was identified. Both the descending geniculate and superomedial genicular artery were identified to supply the medial femoral condyle. Using a 1.9 Fr triple angle Progreat lambda microcatheter, the descending geniculate artery was selected. Angiogram  was performed which demonstrated vascular supply to the soft tissues and skin of the medial aspect of the knee without evidence of articular supply. Therefore, the microcatheter was removed and the 5 French catheter was readvanced to the proximal popliteal artery. Angiogram was performed which demonstrated the origins of the superomedial and superolateral geniculate arteries. Using the microcatheter, the superomedial geniculate artery was selected. Dedicated angiogram was performed which demonstrated vascular supply to the medial femoral condyle with evidence of neovascularity and hyperemia. 100 mcg nitroglycerin  was administered at this location followed by embolization with a dilute solution of 250 micron Embozene spheres in 0.2 mL alliquots until relative stasis was achieved. The catheter was retracted slightly and completion angiogram was performed which demonstrated complete elimination of the previously visualized hyperemia with preserved patency of the parent vessels. The catheters were removed. Hemostasis was achieved with manual  compression. A sterile bandage was applied. Peripheral pulses were unchanged. The patient tolerated the procedure well and was transferred to the recovery area in good condition. IMPRESSION: Technically successful right geniculate artery embolization. Ester Sides, MD [facility] Radiology Electronically Signed   By: Ester Sides M.D.   On: 01/14/2024 14:36   LONG TERM MONITOR (3-14 DAYS) Result Date: 12/30/2023 Predominant rhythm normal sinus Rare supraventricular ectopy Frequent ventricular ectopy 20.3% No sustained arrhythmias   DG Chest 2 View Result Date: 12/17/2023 EXAM: 2 VIEW(S) XRAY OF THE CHEST 12/17/2023 12:22:25 PM COMPARISON: 07/10/2023 CLINICAL HISTORY: cough. Pt states he has had a cough for a couple months but it has gotten worse over last 2 weeks. He has been seen by multiple provider. ; Symptoms worse at night. FINDINGS: LUNGS AND PLEURA: No focal pulmonary opacity. No pulmonary edema. No pleural effusion. No pneumothorax. HEART AND MEDIASTINUM: Calcified aorta. Sternotomy and CABG changes noted. BONES AND SOFT TISSUES: Cervical fusion hardware noted. Thoracic degenerative changes. No acute osseous abnormality. IMPRESSION: 1. No acute cardiopulmonary process identified. Electronically signed by: Waddell Calk MD 12/17/2023 12:57 PM EDT RP Workstation: [id number]    Labs: CBC    Component Value Date/Time   WBC 7.9 12/17/2023 1316   WBC 5.8 10/27/2023 1233   RBC 4.62 12/17/2023 1316   RBC 4.61 10/27/2023 1233   HGB 14.6 12/17/2023 1316   HCT 44.0 12/17/2023 1316   PLT 211 12/17/2023 1316   MCV 95 12/17/2023 1316   MCH 31.6 12/17/2023 1316   MCH 31.2 07/10/2023 1254   MCHC 33.2 12/17/2023 1316   MCHC 34.4 10/27/2023 1233   RDW 12.4 12/17/2023 1316   LYMPHSABS 1.4 12/17/2023 1316   MONOABS 0.6 10/27/2023 1233   EOSABS 0.6 (H) 12/17/2023 1316   BASOSABS 0.1 12/17/2023 1316    CMP     Component Value Date/Time   NA 137  12/17/2023 1316   K 4.9 12/17/2023 1316   CL 98 12/17/2023 1316   CO2 22 12/17/2023 1316   GLUCOSE 115 (H) 12/17/2023 1316   GLUCOSE 96 10/27/2023 1233   BUN 18 12/17/2023 1316   CREATININE 1.28 (H) 12/17/2023 1316   CREATININE 1.33 (H) 02/23/2020 0933   CALCIUM  10.0 12/17/2023 1316   PROT 6.7 12/17/2023 1316   ALBUMIN 4.3 12/17/2023 1316   AST 15 12/17/2023 1316   ALT 17 12/17/2023 1316   ALKPHOS 75 12/17/2023 1316   BILITOT 0.9 12/17/2023 1316   GFRNONAA 59 (L) 07/10/2023 1254   GFRNONAA 51 (L) 02/23/2020 0933   GFRAA 59 (L) 02/23/2020 0933       Assessment and  Plan:   Joshua [name] is a 83 y.o. y/o male returns for follow-up of:  1.  Irritable bowel syndrome, constipation predominant: He has tried and failed multiple treatments including: MiraLAX , Benefiber, probiotic, Linzess , Amitiza , IBgard, dicyclomine , and Ibsrela.   - Trulance not covered? - Continue Motegrity ? - Consider amitriptyline  (TCA) versus Cymbalta  (SNRI) at low-dose if IBS symptoms persisted. - Anorectal manometry? - Pelvic floor physical therapy?  2.  GERD and hiatal hernia - Continue pantoprazole  40 mg daily - GERD diet  3.  History of adenomatous colon polyps: Last colonoscopy 04/2021 showed 3 small tubular adenoma polyps removed.  No further surveillance colonoscopies were recommended due to advanced age.  Assessment and Plan Assessment & Plan       Joshua Console, Joshua [name]  Follow up ***

## 2024-01-17 ENCOUNTER — Telehealth: Payer: Self-pay

## 2024-01-17 ENCOUNTER — Encounter: Payer: Self-pay | Admitting: Physician Assistant

## 2024-01-17 ENCOUNTER — Ambulatory Visit: Admitting: Physician Assistant

## 2024-01-17 ENCOUNTER — Other Ambulatory Visit: Payer: Self-pay | Admitting: Physician Assistant

## 2024-01-17 ENCOUNTER — Other Ambulatory Visit: Payer: Self-pay | Admitting: Family Medicine

## 2024-01-17 VITALS — BP 102/58 | HR 42 | Ht 66.0 in | Wt 200.1 lb

## 2024-01-17 DIAGNOSIS — K581 Irritable bowel syndrome with constipation: Secondary | ICD-10-CM

## 2024-01-17 DIAGNOSIS — R109 Unspecified abdominal pain: Secondary | ICD-10-CM

## 2024-01-17 MED ORDER — HYOSCYAMINE SULFATE 0.125 MG PO TABS: 0.1250 mg | ORAL_TABLET | Freq: Three times a day (TID) | ORAL | 3 refills | Status: DC | PRN

## 2024-01-17 NOTE — Patient Instructions (Addendum)
-   Stop Prucalapride and lubiprostone .  Stop Miralax .  - Start over-the-counter Senokot S 50mg  / 8.6 mg 1 or 2 tablets before bedtime once daily for constipation.  You can increase Senokot S to 2 tablets twice daily.   Maximum dose is 4 tablets twice daily if needed for constipation.  -If you are having diarrhea, then add Metamucil powder, 1 packet in a drink once daily.  - I have sent a prescription for hyoscyamine 0.125 mg 1 tablet 3 times daily as needed for abdominal cramping.  Only take it if you are having abdominal cramping.  I hope this helps!  Nice to meet you today.  Will follow-up again with office visit in a few months.  Ellouise Console, PA-C     Start OTC Senokot, Take 2 tablets once daily for Constipation. You may increase to Maximum of 4 tablets twice daily if needed.     Start Metamucil (Psyllium Husk) Fiber Powder: Metamucil powders: Put 1-2 rounded spoons in an empty glass. If you're taking Metamucil Sugar-Free Powder or Premium Blend, use a teaspoon. If you're taking Metamucil with Real Sugar, use a tablespoon. Mix briskly with 8 oz. or more of cool liquid. Drink promptly, and enjoy! Drink 64 ounces of water or other liquids daily.  Please follow up sooner if symptoms increase or worsen  Due to recent changes in healthcare laws, you may see the results of your imaging and laboratory studies on MyChart before your provider has had a chance to review them.  We understand that in some cases there may be results that are confusing or concerning to you. Not all laboratory results come back in the same time frame and the provider may be waiting for multiple results in order to interpret others.  Please give us  48 hours in order for your provider to thoroughly review all the results before contacting the office for clarification of your results.   Thank you for trusting me with your gastrointestinal care!   Ellouise Console,  PA-C _______________________________________________________  If your blood pressure at your visit was 140/90 or greater, please contact your primary care physician to follow up on this.  _______________________________________________________  If you are age 25 or older, your body mass index should be between 23-30. Your Body mass index is 32.3 kg/m. If this is out of the aforementioned range listed, please consider follow up with your Primary Care Provider.  If you are age 56 or younger, your body mass index should be between 19-25. Your Body mass index is 32.3 kg/m. If this is out of the aformentioned range listed, please consider follow up with your Primary Care Provider.   ________________________________________________________  The  GI providers would like to encourage you to use MYCHART to communicate with providers for non-urgent requests or questions.  Due to long hold times on the telephone, sending your provider a message by [facility] may be a faster and more efficient way to get a response.  Please allow 48 business hours for a response.  Please remember that this is for non-urgent requests.  _______________________________________________________

## 2024-01-17 NOTE — Progress Notes (Signed)
 See telephone note

## 2024-01-18 ENCOUNTER — Other Ambulatory Visit: Payer: Self-pay | Admitting: Medical Genetics

## 2024-01-18 ENCOUNTER — Telehealth: Payer: Self-pay | Admitting: Family Medicine

## 2024-01-18 ENCOUNTER — Encounter: Payer: Self-pay | Admitting: Physical Therapy

## 2024-01-18 ENCOUNTER — Ambulatory Visit: Admitting: Physical Therapy

## 2024-01-18 DIAGNOSIS — M5459 Other low back pain: Secondary | ICD-10-CM

## 2024-01-18 DIAGNOSIS — M6281 Muscle weakness (generalized): Secondary | ICD-10-CM

## 2024-01-18 DIAGNOSIS — Z006 Encounter for examination for normal comparison and control in clinical research program: Secondary | ICD-10-CM

## 2024-01-18 NOTE — Telephone Encounter (Signed)
 Patient called and stated that he had the GAE procedure on Friday and the shot Dr. Claudene gave has worn off. He would like to know if the brace can be ordered for him that was discussed as it is hard for him to walk. Please advise.

## 2024-01-18 NOTE — Patient Instructions (Signed)
 Access Code: [email or web address] URL: [email or web address] Date: 01/18/2024 Prepared by: Elaine Daring  Exercises - Supine Lower Trunk Rotation  - 1 x daily - 10 reps - 5 seconds hold - Straight Leg Raise  - 1 x daily - 2 sets - 10 reps - Clamshell  - 1 x daily - 20 reps - Modified Thomas Stretch  - 1 x daily - 1 minute hold - Sit to Stand Without Arm Support  - 1 x daily - 2 sets - 10 reps - Seated Hamstring Stretch  - 1 x daily - 3 reps - 20 seconds hold

## 2024-01-18 NOTE — Telephone Encounter (Signed)
 Patient was in the office to see Elaine and requested that Berwyn give him a call to discuss the brace. Please advise.

## 2024-01-18 NOTE — Therapy (Signed)
 OUTPATIENT PHYSICAL THERAPY TREATMENT   Patient Name: Joshua [name] MRN: [id number] DOB:Aug 02, 1940, 83 y.o., male Today's Date: 01/18/2024   END OF SESSION:  PT End of Session - 01/18/24 1058     Visit Number 3    Number of Visits 9    Date for Recertification  02/18/24    Authorization Type [employer]    Progress Note Due on Visit 10    PT Start Time 1016    PT Stop Time 1100    PT Time Calculation (min) 44 min    Activity Tolerance Patient tolerated treatment well    Behavior During Therapy [facility] for tasks assessed/performed            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   Blood transfusion without reported diagnosis 11/1999   CAD (coronary artery disease)    CABG 2001   Depression    Diverticulosis    DJD (degenerative joint disease)    BACK   GERD (gastroesophageal reflux disease)    H/O inguinal hernia repair 12/2018   History of gout    Hyperlipidemia    Hypertension    IBS (irritable bowel syndrome)    Obesity    OSA (obstructive sleep apnea)    USES CPAP    Pancreatitis ~ 1980   Peripheral vascular disease    Pre-diabetes    Prostate cancer (HCC) 04/2010   Sleep apnea    Past Surgical History:  Procedure Laterality Date   ANTERIOR CERVICAL DECOMP/DISCECTOMY FUSION  05/26/2006   PLATE PLACED   BACK SURGERY     CARDIAC CATHETERIZATION  11/20/1999   CORONARY ARTERY BYPASS GRAFT  11/20/1999   SVG-RI1-RI2, SVG-OM, SVG-dRCA   CORONARY STENT INTERVENTION N/A 04/13/2019   Procedure: CORONARY STENT INTERVENTION;  Surgeon: Verlin Lonni BIRCH, MD;  Location: [facility];  Service: Cardiovascular;  Laterality: N/A;   HERNIA REPAIR     INGUINAL HERNIA REPAIR Left 01/02/2019   Procedure: OPEN LEFT INGUINAL HERNIA  REPAIR WITH MESH;  Surgeon: Gladis Cough, MD;  Location: [facility];  Service: General;  Laterality: Left;   IR EMBO ARTERIAL NOT HEMORR HEMANG INC GUIDE ROADMAPPING  03/08/2023   IR EMBO ARTERIAL NOT HEMORR HEMANG INC GUIDE ROADMAPPING  01/14/2024   IR RADIOLOGIST EVAL & MGMT  02/05/2023   IR RADIOLOGIST EVAL & MGMT  04/05/2023   IR RADIOLOGIST EVAL & MGMT  04/09/2023   IR RADIOLOGIST EVAL & MGMT  04/30/2023   IR RADIOLOGIST EVAL & MGMT  12/13/2023   JOINT REPLACEMENT  Left knee   KNEE ARTHROSCOPY Right 1992; 04/2008   LAPAROSCOPIC CHOLECYSTECTOMY  2007   LEFT HEART CATH AND CORS/GRAFTS ANGIOGRAPHY N/A 04/13/2019   Procedure: LEFT HEART CATH AND CORS/GRAFTS ANGIOGRAPHY;  Surgeon: Verlin Lonni BIRCH, MD;  Location: [facility];  Service: Cardiovascular;  Laterality: N/A;   NASAL SEPTUM SURGERY Right 2007   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   ROBOT ASSISTED LAPAROSCOPIC RADICAL PROSTATECTOMY  04/2010   SPINE SURGERY     THYROIDECTOMY, PARTIAL  1980   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   Procedure: LEFT TOTAL KNEE ARTHROPLASTY;  Surgeon: Sheril Coy, MD;  Location: [facility];  Service: Orthopedics;  Laterality: Left;  Patient Active Problem List   Diagnosis Date Noted   Scrotal pain 02/11/2023   De Quervain's tenosynovitis, right 10/28/2022   Calcium  oxalate crystals in urine 07/30/2022   Balanitis 07/06/2022   Urinary frequency 07/06/2022   Degenerative arthritis of right knee 06/08/2022   Left hamstring injury 04/21/2022   Degenerative cervical disc 01/12/2022   Greater trochanteric bursitis of right hip 02/17/2021   Hyperactive bowel sounds 01/29/2021   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 09/25/2020   Primary osteoarthritis of left knee 06/18/2020   Trigger thumb, left thumb 01/24/2020   Overactive bladder 11/24/2019   Left lateral epicondylitis 09/05/2019   Greater trochanteric bursitis of left hip 10/27/2018   Degenerative disc  disease, lumbar 10/27/2018   Abdominal muscle strain, initial encounter 06/16/2018   Aortic atherosclerosis 05/05/2018   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   Hyperglycemia 10/07/2017   Weakness of left leg 06/21/2017   DJD (degenerative joint disease)    Arthritis    Neck pain 02/08/2017   Venous insufficiency 12/29/2016   Ganglion cyst of tendon sheath of right hand 12/29/2016   Gout 11/20/2016   Obesity, Class I, BMI 30-34.9 10/03/2015   Hereditary and idiopathic peripheral neuropathy 09/25/2014   Allergic rhinitis 05/31/2014   Fatigue 11/09/2013   CKD (chronic kidney disease), stage III (HCC) 10/23/2013   Dizziness 08/23/2013   Neuropathy (HCC) 08/21/2013   Chronic bilateral low back pain with left-sided sciatica 10/09/2011   History of bladder cancer 04/01/2010   Localized osteoarthrosis, lower leg 10/04/2009   Generalized abdominal pain 01/31/2009   Tinnitus 05/05/2007   Essential hypertension 02/21/2007   OSA (obstructive sleep apnea) 11/02/2006   Hyperlipidemia 10/12/2006   Coronary atherosclerosis 10/12/2006   Asthma 10/12/2006   GERD 10/12/2006    PCP: Katrinka Garnette KIDD, MD  REFERRING PROVIDER: Joane Artist RAMAN, MD  REFERRING DIAG: Chronic bilateral low back pain without sciatica  Rationale for Evaluation and Treatment: Rehabilitation  THERAPY DIAG:  Other low back pain  Muscle weakness (generalized)  ONSET DATE: Chronic   SUBJECTIVE SUBJECTIVE STATEMENT: Patient reports his knees have been hurting bad, he had the GAE and the knee is still hurting. His back has also been bothering him.  Eval: Patient reports he has had back pain for a long time with no mechanism of injury. He tried pool therapy at least 3 years ago and he didn't like that. Today his back pain is good, most days are ok, and then occasionally he has severe pain. He thinks lifting heavy objects or combine squatting and bending to clean the litter box causes his pain to get worse. He reports difficulty  getting out of bed in the morning. Pain is located to the lower back, across the back and worse on the left side. He has had epidurals in his back that worked for a while and then stopped working. He does report painful peripheral neuropathy in his feet that seems to affect his balance.   PERTINENT HISTORY:  See PMH above  PAIN:  Are you having pain? Yes:  NPRS scale: 5/10 currently, 7/10 at worst Pain location: Lower back Pain description: pushing type of pain Aggravating factors: Lifting, squatting and bending Relieving factors: Massage, heat, Tylenol   PRECAUTIONS: None  PATIENT GOALS: Improve pain   OBJECTIVE:  Note: Objective measures were completed at Evaluation unless otherwise noted. PATIENT SURVEYS:  Modified Oswestry: 24% disability (12/50)  MUSCLE LENGTH: Limitation   POSTURE:  Decreased lumbar lordosis, rounded shoulder posture, forward trunk  lean  PALPATION: Tender to palpation across lumbar region, L > R  LUMBAR ROM:   AROM eval  Flexion 25%  Extension 50%  Right lateral flexion 25%  Left lateral flexion 25%  Right rotation 50%  Left rotation 25%   (Blank rows = not tested)  LOWER EXTREMITY ROM:      Hip PROM grossly WFL  LOWER EXTREMITY MMT:    MMT Right eval Left eval  Hip flexion 4 4-  Hip extension 3- 3-  Hip abduction 3- 3-  Hip adduction    Hip internal rotation    Hip external rotation    Knee flexion 4+ 4+  Knee extension 4+ 4  Ankle dorsiflexion    Ankle plantarflexion    Ankle inversion    Ankle eversion     (Blank rows = not tested)  LUMBAR SPECIAL TESTS:  Lumbar radicular testing negative  FUNCTIONAL TESTS:  5 times sit to stand: 18 seconds  GAIT: Assistive device utilized: None Level of assistance: Complete Independence Comments: Forward trunk lean, wide BOS   TREATMENT [facility] Adult PT Treatment:                                                DATE: 01/18/2024 Prone IASTM using theragun for lumbar paraspinals and  gluteal region Prone lumbar PA mobs Seated hamstring stretch 3 x 20 sec each Seated lumbar flexion stretch with stability 10 x 5 sec Seated clamshell with red 3 x 10 Seated abdominal set with stability ball 2 x 10  Spent time discussing patient's HEP and modifying HEP to avoid aggravating his back and knees  PATIENT EDUCATION:  Education details: HEP update Person educated: Patient Education method: Explanation, Demonstration, Tactile cues, Verbal cues Education comprehension: verbalized understanding, returned demonstration, verbal cues required, tactile cues required, and needs further education  HOME EXERCISE PROGRAM: Access Code: [email or web address]    ASSESSMENT: CLINICAL IMPRESSION: Patient tolerated therapy well with no adverse effects. Therapy focused on improving his lumbar mobility, stretching for the lower back and hamstrings, and light strengthening for the core and hips with good tolerance. He did arrive reporting increased low back and knee pain so focused on exercises that did not aggravate his symptoms. He tolerates manual therapy well reporting improvement in back pain. Updated his HEP to remove thomas stretch and modify reps for sit to stand exercise. Patient would benefit from continued skilled PT to progress mobility and strength in order to reduce pain and maximize functional ability.   Eval: Patient is a 83 y.o. male who was seen today for physical therapy evaluation and treatment for chronic lower back pain. He exhibits limitations in his lumbar mobility, hip and hamstring flexibility, and gross strength deficits with postural deviations that are contributing to his pain and impacting his functional ability.   OBJECTIVE IMPAIRMENTS: Abnormal gait, decreased activity tolerance, decreased balance, decreased ROM, decreased strength, impaired flexibility, postural dysfunction, and pain.   ACTIVITY LIMITATIONS: carrying, lifting, bending, standing, squatting, sleeping, and  locomotion level  PARTICIPATION LIMITATIONS: cleaning and community activity  PERSONAL FACTORS: Fitness, Past/current experiences, and Time since onset of injury/illness/exacerbation are also affecting patient's functional outcome.    GOALS: Goals reviewed with patient? No  SHORT TERM GOALS: Target date: 01/21/2024  Patient will be I with initial HEP in order to progress with therapy. Baseline: HEP provided at eval Goal status: INITIAL  2.  Patient will report low back pain </= 4/10 at worst in order to reduce functional limitations Baseline: 7/10 Goal status: INITIAL  LONG TERM GOALS: Target date: 02/18/2024  Patient will be I with final HEP to maintain progress from PT. Baseline: HEP provided at eval Goal status: INITIAL  2.  Patient will report Modified Oswestry </= 6/50 (12% disability) in order to indicate an improvement in their functional status Baseline: 24% Goal status: INITIAL  3.  Patient will demonstrate a 25% improvement in his lumbar motion in order to improve his ability to bend and clean the litter box at home Baseline: see limitations above Goal status: INITIAL  4.  Patient will exhibit hip strength >/= 4-/5 MMT in order to improve standing, walking, and lifting tolerance Baseline: grossly 3-/5 MMT Goal status: INITIAL  5. Patient will demonstrate 5xSTS </= 12 seconds in order to indicate improved LE strength  Baseline: 18 seconds  Goal status: INITIAL   PLAN: PT FREQUENCY: 1x/week  PT DURATION: 8 weeks  PLANNED INTERVENTIONS: 97164- PT Re-evaluation, 97750- Physical Performance Testing, 97110-Therapeutic exercises, 97530- Therapeutic activity, 97112- Neuromuscular re-education, 97535- Self Care, 02859- Manual therapy, 20560 (1-2 muscles), 20561 (3+ muscles)- Dry Needling, Patient/Family education, Joint mobilization, Joint manipulation, Spinal manipulation, Spinal mobilization, Cryotherapy, and Moist heat.  PLAN FOR NEXT SESSION: Review HEP and  progress PRN, manual/stretching for lumbar spine, hips, and hamstring, progress core stabilization and hip/LE strengthening as tolerated; further assessment of balance   Elaine Daring, PT, DPT, LAT, ATC 01/18/24  11:18 AM Phone: [phone] Fax: [phone]

## 2024-01-18 NOTE — Telephone Encounter (Signed)
 Spoke with patient who is going to come in for a hinged brace at 9am on 01/20/2024.

## 2024-01-19 MED ORDER — DICYCLOMINE HCL 10 MG PO CAPS: 10.0000 mg | ORAL_CAPSULE | Freq: Two times a day (BID) | ORAL | 2 refills | Status: DC

## 2024-01-19 NOTE — Addendum Note (Signed)
 Addended by: LANETTE ALETHEA CROME on: 01/19/2024 09:26 AM   Modules accepted: Orders

## 2024-01-20 ENCOUNTER — Encounter: Payer: Self-pay | Admitting: Family Medicine

## 2024-01-20 DIAGNOSIS — M1711 Unilateral primary osteoarthritis, right knee: Secondary | ICD-10-CM | POA: Diagnosis not present

## 2024-01-21 ENCOUNTER — Other Ambulatory Visit (HOSPITAL_BASED_OUTPATIENT_CLINIC_OR_DEPARTMENT_OTHER): Payer: Self-pay

## 2024-01-21 MED ORDER — COMIRNATY 30 MCG/0.3ML IM SUSY
0.3000 mL | PREFILLED_SYRINGE | Freq: Once | INTRAMUSCULAR | 0 refills | Status: AC
  Filled 2024-01-21: qty 0.3, 1d supply, fill #0

## 2024-01-25 ENCOUNTER — Telehealth: Payer: Self-pay | Admitting: Physician Assistant

## 2024-01-25 DIAGNOSIS — K581 Irritable bowel syndrome with constipation: Secondary | ICD-10-CM

## 2024-01-25 DIAGNOSIS — R109 Unspecified abdominal pain: Secondary | ICD-10-CM

## 2024-01-25 MED ORDER — DICYCLOMINE HCL 10 MG PO CAPS: 10.0000 mg | ORAL_CAPSULE | Freq: Two times a day (BID) | ORAL | 3 refills | Status: DC | PRN

## 2024-01-25 NOTE — Telephone Encounter (Signed)
 I have sent the Dicyclomine  to the patients pharmacy. I will call the patient to notify him.

## 2024-01-26 ENCOUNTER — Ambulatory Visit: Admitting: Family Medicine

## 2024-01-26 ENCOUNTER — Encounter: Admitting: Physical Therapy

## 2024-01-26 ENCOUNTER — Other Ambulatory Visit: Payer: Self-pay

## 2024-01-26 VITALS — BP 138/80 | HR 84 | Ht 66.0 in | Wt 200.0 lb

## 2024-01-26 DIAGNOSIS — M1711 Unilateral primary osteoarthritis, right knee: Secondary | ICD-10-CM | POA: Diagnosis not present

## 2024-01-26 DIAGNOSIS — G8929 Other chronic pain: Secondary | ICD-10-CM | POA: Diagnosis not present

## 2024-01-26 DIAGNOSIS — M25561 Pain in right knee: Secondary | ICD-10-CM | POA: Diagnosis not present

## 2024-01-26 NOTE — Telephone Encounter (Signed)
 Is pt supposed to be getting Monovisc today?? Claudene gave him a steroid injection a month ago

## 2024-01-26 NOTE — Patient Instructions (Signed)
 Thank you for coming in today.   CBD  Icy Hot  Please use Voltaren  gel (Generic Diclofenac  Gel) up to 4x daily for pain as needed.  This is available over-the-counter as both the name brand Voltaren  gel and the generic diclofenac  gel.   We can do an injection in the future if needed

## 2024-01-26 NOTE — Telephone Encounter (Signed)
 I am not sure. I think when it was requested to be authorized, that was to have for the 10-12 week follow up if needed, which is scheduled in December. Patient called today stating he was having severe right knee pain. So much so that he canceled his PT appointment because of the pain.  Looks like patient has tried Monovisc in the past (May 2024), but not sure what he would want to do at this point and what Dr Joane thinks would be best.  I assume that Dr Claudene wanted the authorization ready just in case.

## 2024-01-26 NOTE — Progress Notes (Signed)
   LILLETTE Ileana Collet, PhD, LAT, ATC acting as a scribe for Artist Lloyd, MD.  Joshua [name] is a 83 y.o. male who presents to [facility] today for R knee pain. Pt was last seen by Dr. Claudene on 12/22/23 and was given a R knee steroid injection.  Today, pt reports prior CSI only helped for 2-3 wks. Pain has worsened daily, terrible this morning. He didn't find the DonJoy knee brace helpful. R knee GAE 01/14/24  Dx testing: 06/25/22 R knee XR  Pertinent review of systems: No fevers or chills  Relevant historical information: Good results from left genicular artery embolization in the past.  As noted above had right genicular artery embolization 9-10 days ago.   Exam:  BP 138/80   Pulse 84   Ht 5' 6 (1.676 m)   Wt 200 lb (90.7 kg)   SpO2 97%   BMI 32.28 kg/m  General: Well Developed, well nourished, and in no acute distress.   MSK: Right knee no significant bruising.  Tender palpation medial joint line.   MRI arthrogram report and images reviewed with patient.    Assessment and Plan: 83 y.o. male with right knee pain due to DJD.  Patient had genicular artery embolization on October 24.  I do not expect the pain to be improving at this point.  He had a steroid injection about a month ago with my partner Dr. Claudene.  We talked about medication management.  He really cannot take much NSAIDs and has had intolerances or allergies to most opiates.  We talked about options and will use Voltaren  gel and IcyHot and capsaicin cream and some version of CBD all of which she has used in the past which seemed to help some.  If his pain is uncontrollable we may get forced into using an opiate or doing an injection again sooner.   PDMP not reviewed this encounter. No orders of the defined types were placed in this encounter.  No orders of the defined types were placed in this encounter.    Discussed warning signs or symptoms. Please see discharge instructions.  Patient expresses understanding.   The above documentation has been reviewed and is accurate and complete Artist Lloyd, M.D.

## 2024-01-29 ENCOUNTER — Other Ambulatory Visit: Payer: Self-pay | Admitting: Internal Medicine

## 2024-01-31 ENCOUNTER — Encounter: Payer: Self-pay | Admitting: Neurology

## 2024-02-02 ENCOUNTER — Encounter: Payer: Self-pay | Admitting: Physical Therapy

## 2024-02-02 ENCOUNTER — Ambulatory Visit: Admitting: Physical Therapy

## 2024-02-02 ENCOUNTER — Other Ambulatory Visit: Payer: Self-pay

## 2024-02-02 DIAGNOSIS — M6281 Muscle weakness (generalized): Secondary | ICD-10-CM | POA: Diagnosis not present

## 2024-02-02 DIAGNOSIS — M5459 Other low back pain: Secondary | ICD-10-CM | POA: Diagnosis not present

## 2024-02-02 NOTE — Therapy (Signed)
 OUTPATIENT PHYSICAL THERAPY TREATMENT   Patient Name: Joshua [name] MRN: [id number] DOB:[birth year], 83 y.o., male Today's Date: 02/02/2024   END OF SESSION:  PT End of Session - 02/02/24 1016     Visit Number 4    Number of Visits 9    Date for Recertification  02/18/24    Authorization Type [employer]    Progress Note Due on Visit 10    PT Start Time 1015    PT Stop Time 1100    PT Time Calculation (min) 45 min    Activity Tolerance Patient tolerated treatment well    Behavior During Therapy [facility] for tasks assessed/performed             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   Blood transfusion without reported diagnosis 11/1999   CAD (coronary artery disease)    CABG 2001   Depression    Diverticulosis    DJD (degenerative joint disease)    BACK   GERD (gastroesophageal reflux disease)    H/O inguinal hernia repair 12/2018   History of gout    Hyperlipidemia    Hypertension    IBS (irritable bowel syndrome)    Obesity    OSA (obstructive sleep apnea)    USES CPAP    Pancreatitis ~ 1980   Peripheral vascular disease    Pre-diabetes    Prostate cancer (HCC) 04/2010   Sleep apnea    Past Surgical History:  Procedure Laterality Date   ANTERIOR CERVICAL DECOMP/DISCECTOMY FUSION  05/26/2006   PLATE PLACED   BACK SURGERY     CARDIAC CATHETERIZATION  11/20/1999   CORONARY ARTERY BYPASS GRAFT  11/20/1999   SVG-RI1-RI2, SVG-OM, SVG-dRCA   CORONARY STENT INTERVENTION N/A 04/13/2019   Procedure: CORONARY STENT INTERVENTION;  Surgeon: Verlin Lonni BIRCH, MD;  Location: [facility];  Service: Cardiovascular;  Laterality: N/A;   HERNIA REPAIR     INGUINAL HERNIA REPAIR Left 01/02/2019   Procedure: OPEN LEFT INGUINAL  HERNIA REPAIR WITH MESH;  Surgeon: Gladis Cough, MD;  Location: [facility];  Service: General;  Laterality: Left;   IR EMBO ARTERIAL NOT HEMORR HEMANG INC GUIDE ROADMAPPING  03/08/2023   IR EMBO ARTERIAL NOT HEMORR [employer]ANG [employer] GUIDE ROADMAPPING  01/14/2024   IR RADIOLOGIST EVAL & MGMT  02/05/2023   IR RADIOLOGIST EVAL & MGMT  04/05/2023   IR RADIOLOGIST EVAL & MGMT  04/09/2023   IR RADIOLOGIST EVAL & MGMT  04/30/2023   IR RADIOLOGIST EVAL & MGMT  12/13/2023   JOINT REPLACEMENT  Left knee   KNEE ARTHROSCOPY Right 1992; 04/2008   LAPAROSCOPIC CHOLECYSTECTOMY  2007   LEFT HEART CATH AND CORS/GRAFTS ANGIOGRAPHY N/A 04/13/2019   Procedure: LEFT HEART CATH AND CORS/GRAFTS ANGIOGRAPHY;  Surgeon: Verlin Lonni BIRCH, MD;  Location: [facility];  Service: Cardiovascular;  Laterality: N/A;   NASAL SEPTUM SURGERY Right 2007   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   ROBOT ASSISTED LAPAROSCOPIC RADICAL PROSTATECTOMY  04/2010   SPINE SURGERY     THYROIDECTOMY, PARTIAL  1980   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   Procedure: LEFT TOTAL KNEE ARTHROPLASTY;  Surgeon: Sheril Coy, MD;  Location: [facility];  Service: Orthopedics;  Laterality:  Left;   Patient Active Problem List   Diagnosis Date Noted   Scrotal pain 02/11/2023   De Quervain's tenosynovitis, right 10/28/2022   Calcium  oxalate crystals in urine 07/30/2022   Balanitis 07/06/2022   Urinary frequency 07/06/2022   Degenerative arthritis of right knee 06/08/2022   Left hamstring injury 04/21/2022   Degenerative cervical disc 01/12/2022   Greater trochanteric bursitis of right hip 02/17/2021   Hyperactive bowel sounds 01/29/2021   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 09/25/2020   Primary osteoarthritis of left knee 06/18/2020   Trigger thumb, left thumb 01/24/2020   Overactive bladder 11/24/2019   Left lateral epicondylitis 09/05/2019   Greater trochanteric bursitis of left hip 10/27/2018   Degenerative disc  disease, lumbar 10/27/2018   Abdominal muscle strain, initial encounter 06/16/2018   Aortic atherosclerosis 05/05/2018   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   Hyperglycemia 10/07/2017   Weakness of left leg 06/21/2017   DJD (degenerative joint disease)    Arthritis    Neck pain 02/08/2017   Venous insufficiency 12/29/2016   Ganglion cyst of tendon sheath of right hand 12/29/2016   Gout 11/20/2016   Obesity, Class I, BMI 30-34.9 10/03/2015   Hereditary and idiopathic peripheral neuropathy 09/25/2014   Allergic rhinitis 05/31/2014   Fatigue 11/09/2013   CKD (chronic kidney disease), stage III (HCC) 10/23/2013   Dizziness 08/23/2013   Neuropathy (HCC) 08/21/2013   Chronic bilateral low back pain with left-sided sciatica 10/09/2011   History of bladder cancer 04/01/2010   Localized osteoarthrosis, lower leg 10/04/2009   Generalized abdominal pain 01/31/2009   Tinnitus 05/05/2007   Essential hypertension 02/21/2007   OSA (obstructive sleep apnea) 11/02/2006   Hyperlipidemia 10/12/2006   Coronary atherosclerosis 10/12/2006   Asthma 10/12/2006   GERD 10/12/2006    PCP: Katrinka Garnette KIDD, MD  REFERRING PROVIDER: Joane Artist RAMAN, MD  REFERRING DIAG: Chronic bilateral low back pain without sciatica  Rationale for Evaluation and Treatment: Rehabilitation  THERAPY DIAG:  Other low back pain  Muscle weakness (generalized)  ONSET DATE: Chronic   SUBJECTIVE SUBJECTIVE STATEMENT: Patient reports his back is doing better, he had to miss last visit because his knee was very painful.   Eval: Patient reports he has had back pain for a long time with no mechanism of injury. He tried pool therapy at least 3 years ago and he didn't like that. Today his back pain is good, most days are ok, and then occasionally he has severe pain. He thinks lifting heavy objects or combine squatting and bending to clean the litter box causes his pain to get worse. He reports difficulty getting out of bed in the  morning. Pain is located to the lower back, across the back and worse on the left side. He has had epidurals in his back that worked for a while and then stopped working. He does report painful peripheral neuropathy in his feet that seems to affect his balance.   PERTINENT HISTORY:  See PMH above  PAIN:  Are you having pain? Yes:  NPRS scale: 5/10 currently, 7/10 at worst Pain location: Lower back Pain description: pushing type of pain Aggravating factors: Lifting, squatting and bending Relieving factors: Massage, heat, Tylenol   PRECAUTIONS: None  PATIENT GOALS: Improve pain   OBJECTIVE:  Note: Objective measures were completed at Evaluation unless otherwise noted. PATIENT SURVEYS:  Modified Oswestry: 24% disability (12/50)  MUSCLE LENGTH: Limitation   POSTURE:  Decreased lumbar lordosis, rounded shoulder posture, forward trunk lean  PALPATION: Tender to palpation across lumbar region, L > R  LUMBAR ROM:   AROM eval  Flexion 25%  Extension 50%  Right lateral flexion 25%  Left lateral flexion 25%  Right rotation 50%  Left rotation 25%   (Blank rows = not tested)  LOWER EXTREMITY ROM:      Hip PROM grossly WFL  LOWER EXTREMITY MMT:    MMT Right eval Left eval  Hip flexion 4 4-  Hip extension 3- 3-  Hip abduction 3- 3-  Hip adduction    Hip internal rotation    Hip external rotation    Knee flexion 4+ 4+  Knee extension 4+ 4  Ankle dorsiflexion    Ankle plantarflexion    Ankle inversion    Ankle eversion     (Blank rows = not tested)  LUMBAR SPECIAL TESTS:  Lumbar radicular testing negative  FUNCTIONAL TESTS:  5 times sit to stand: 18 seconds  GAIT: Assistive device utilized: None Level of assistance: Complete Independence Comments: Forward trunk lean, wide BOS   TREATMENT [facility] Adult PT Treatment:                                                DATE: 02/02/2024 Prone IASTM using theragun for lumbar and thoracic paraspinals and gluteal  region Prone lumbar and thoracic PA mobs Seated hamstring stretch 3 x 20 sec each Seated lumbar flexion stretch with stability ball 5 x 5 sec fwd and diagonal Row with blue 3 x 5  Discussed current HEP and modified exercise prescription to avoid further knee pain.   PATIENT EDUCATION:  Education details: HEP update Person educated: Patient Education method: Explanation, Demonstration, Tactile cues, Verbal cues, Handout Education comprehension: verbalized understanding, returned demonstration, verbal cues required, tactile cues required, and needs further education  HOME EXERCISE PROGRAM: Access Code: [email or web address]    ASSESSMENT: CLINICAL IMPRESSION: Patient tolerated therapy well with no adverse effects. Therapy focused on improving lumbar/thoracic mobility, stretching, and improving postural control with good tolerance. He seems to be limited in his mobility primarily due to his knees and this is likely contributing to his back pain. He did report more thoracic muscular pain this visit so incorporated manual for the thoracic spine as well and provided exercise for periscapular/postural strengthening. Patient would benefit from continued skilled PT to progress mobility and strength in order to reduce pain and maximize functional ability.   Eval: Patient is a 83 y.o. male who was seen today for physical therapy evaluation and treatment for chronic lower back pain. He exhibits limitations in his lumbar mobility, hip and hamstring flexibility, and gross strength deficits with postural deviations that are contributing to his pain and impacting his functional ability.   OBJECTIVE IMPAIRMENTS: Abnormal gait, decreased activity tolerance, decreased balance, decreased ROM, decreased strength, impaired flexibility, postural dysfunction, and pain.   ACTIVITY LIMITATIONS: carrying, lifting, bending, standing, squatting, sleeping, and locomotion level  PARTICIPATION LIMITATIONS: cleaning and community  activity  PERSONAL FACTORS: Fitness, Past/current experiences, and Time since onset of injury/illness/exacerbation are also affecting patient's functional outcome.    GOALS: Goals reviewed with patient? No  SHORT TERM GOALS: Target date: 01/21/2024  Patient will be I with initial HEP in order to progress with therapy. Baseline: HEP provided at eval Goal status: INITIAL  2.  Patient will report low back pain </= 4/10 at worst in order  to reduce functional limitations Baseline: 7/10 Goal status: INITIAL  LONG TERM GOALS: Target date: 02/18/2024  Patient will be I with final HEP to maintain progress from PT. Baseline: HEP provided at eval Goal status: INITIAL  2.  Patient will report Modified Oswestry </= 6/50 (12% disability) in order to indicate an improvement in their functional status Baseline: 24% Goal status: INITIAL  3.  Patient will demonstrate a 25% improvement in his lumbar motion in order to improve his ability to bend and clean the litter box at home Baseline: see limitations above Goal status: INITIAL  4.  Patient will exhibit hip strength >/= 4-/5 MMT in order to improve standing, walking, and lifting tolerance Baseline: grossly 3-/5 MMT Goal status: INITIAL  5. Patient will demonstrate 5xSTS </= 12 seconds in order to indicate improved LE strength  Baseline: 18 seconds  Goal status: INITIAL   PLAN: PT FREQUENCY: 1x/week  PT DURATION: 8 weeks  PLANNED INTERVENTIONS: 97164- PT Re-evaluation, 97750- Physical Performance Testing, 97110-Therapeutic exercises, 97530- Therapeutic activity, 97112- Neuromuscular re-education, 97535- Self Care, 02859- Manual therapy, 20560 (1-2 muscles), 20561 (3+ muscles)- Dry Needling, Patient/Family education, Joint mobilization, Joint manipulation, Spinal manipulation, Spinal mobilization, Cryotherapy, and Moist heat.  PLAN FOR NEXT SESSION: Review HEP and progress PRN, manual/stretching for lumbar spine, hips, and hamstring,  progress core stabilization and hip/LE strengthening as tolerated; further assessment of balance   Elaine Daring, PT, DPT, LAT, ATC 02/02/24  11:46 AM Phone: [phone] Fax: [phone]

## 2024-02-07 ENCOUNTER — Other Ambulatory Visit: Payer: Self-pay | Admitting: Interventional Radiology

## 2024-02-07 DIAGNOSIS — M1711 Unilateral primary osteoarthritis, right knee: Secondary | ICD-10-CM

## 2024-02-09 ENCOUNTER — Other Ambulatory Visit: Payer: Self-pay

## 2024-02-09 ENCOUNTER — Ambulatory Visit: Admitting: Physical Therapy

## 2024-02-09 ENCOUNTER — Encounter: Payer: Self-pay | Admitting: Physical Therapy

## 2024-02-09 DIAGNOSIS — M6281 Muscle weakness (generalized): Secondary | ICD-10-CM | POA: Diagnosis not present

## 2024-02-09 DIAGNOSIS — M5459 Other low back pain: Secondary | ICD-10-CM | POA: Diagnosis not present

## 2024-02-09 NOTE — Patient Instructions (Signed)
 Access Code: [email or web address] URL: [email or web address] Date: 02/09/2024 Prepared by: Elaine Daring  Exercises - Supine Lower Trunk Rotation  - 1 x daily - 10 reps - 5 seconds hold - Clamshell  - 1 x daily - 20 reps - Seated Hamstring Stretch  - 1 x daily - 3 reps - 20 seconds hold - Standing Row with Anchored Resistance  - 1 x daily - 2-3 sets - 5-10 reps

## 2024-02-09 NOTE — Therapy (Signed)
 OUTPATIENT PHYSICAL THERAPY TREATMENT   Patient Name: Joshua [name] MRN: [id number] DOB:[birth year], 83 y.o., male Today's Date: 02/09/2024   END OF SESSION:  PT End of Session - 02/09/24 1040     Visit Number 5    Number of Visits 9    Date for Recertification  02/18/24    Authorization Type [employer]    Progress Note Due on Visit 10    PT Start Time 1015    PT Stop Time 1100    PT Time Calculation (min) 45 min    Activity Tolerance Patient tolerated treatment well    Behavior During Therapy [facility] for tasks assessed/performed              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   Blood transfusion without reported diagnosis 11/1999   CAD (coronary artery disease)    CABG 2001   Depression    Diverticulosis    DJD (degenerative joint disease)    BACK   GERD (gastroesophageal reflux disease)    H/O inguinal hernia repair 12/2018   History of gout    Hyperlipidemia    Hypertension    IBS (irritable bowel syndrome)    Obesity    OSA (obstructive sleep apnea)    USES CPAP    Pancreatitis ~ 1980   Peripheral vascular disease    Pre-diabetes    Prostate cancer (HCC) 04/2010   Sleep apnea    Past Surgical History:  Procedure Laterality Date   ANTERIOR CERVICAL DECOMP/DISCECTOMY FUSION  05/26/2006   PLATE PLACED   BACK SURGERY     CARDIAC CATHETERIZATION  11/20/1999   CORONARY ARTERY BYPASS GRAFT  11/20/1999   SVG-RI1-RI2, SVG-OM, SVG-dRCA   CORONARY STENT INTERVENTION N/A 04/13/2019   Procedure: CORONARY STENT INTERVENTION;  Surgeon: Verlin Lonni BIRCH, MD;  Location: [facility];  Service: Cardiovascular;  Laterality: N/A;   HERNIA REPAIR     INGUINAL HERNIA REPAIR Left 01/02/2019   Procedure: OPEN LEFT INGUINAL  HERNIA REPAIR WITH MESH;  Surgeon: Gladis Cough, MD;  Location: [facility];  Service: General;  Laterality: Left;   IR EMBO ARTERIAL NOT HEMORR HEMANG INC GUIDE ROADMAPPING  03/08/2023   IR EMBO ARTERIAL NOT HEMORR [employer]ANG INC GUIDE ROADMAPPING  01/14/2024   IR RADIOLOGIST EVAL & MGMT  02/05/2023   IR RADIOLOGIST EVAL & MGMT  04/05/2023   IR RADIOLOGIST EVAL & MGMT  04/09/2023   IR RADIOLOGIST EVAL & MGMT  04/30/2023   IR RADIOLOGIST EVAL & MGMT  12/13/2023   JOINT REPLACEMENT  Left knee   KNEE ARTHROSCOPY Right 1992; 04/2008   LAPAROSCOPIC CHOLECYSTECTOMY  2007   LEFT HEART CATH AND CORS/GRAFTS ANGIOGRAPHY N/A 04/13/2019   Procedure: LEFT HEART CATH AND CORS/GRAFTS ANGIOGRAPHY;  Surgeon: Verlin Lonni BIRCH, MD;  Location: [facility];  Service: Cardiovascular;  Laterality: N/A;   NASAL SEPTUM SURGERY Right 2007   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   ROBOT ASSISTED LAPAROSCOPIC RADICAL PROSTATECTOMY  04/2010   SPINE SURGERY     THYROIDECTOMY, PARTIAL  1980   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   Procedure: LEFT TOTAL KNEE ARTHROPLASTY;  Surgeon: Sheril Coy, MD;  Location: [facility];  Service: Orthopedics;  Laterality: Left;   Patient Active Problem List   Diagnosis Date Noted   Scrotal pain 02/11/2023   De Quervain's tenosynovitis, right 10/28/2022   Calcium  oxalate crystals in urine 07/30/2022   Balanitis 07/06/2022   Urinary frequency 07/06/2022   Degenerative arthritis of right knee 06/08/2022   Left hamstring injury 04/21/2022   Degenerative cervical disc 01/12/2022   Greater trochanteric bursitis of right hip 02/17/2021   Hyperactive bowel sounds 01/29/2021   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 09/25/2020   Primary osteoarthritis of left knee 06/18/2020   Trigger thumb, left thumb 01/24/2020   Overactive bladder 11/24/2019   Left lateral epicondylitis 09/05/2019   Greater trochanteric bursitis of left hip 10/27/2018   Degenerative disc  disease, lumbar 10/27/2018   Abdominal muscle strain, initial encounter 06/16/2018   Aortic atherosclerosis 05/05/2018   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   Hyperglycemia 10/07/2017   Weakness of left leg 06/21/2017   DJD (degenerative joint disease)    Arthritis    Neck pain 02/08/2017   Venous insufficiency 12/29/2016   Ganglion cyst of tendon sheath of right hand 12/29/2016   Gout 11/20/2016   Obesity, Class I, BMI 30-34.9 10/03/2015   Hereditary and idiopathic peripheral neuropathy 09/25/2014   Allergic rhinitis 05/31/2014   Fatigue 11/09/2013   CKD (chronic kidney disease), stage III (HCC) 10/23/2013   Dizziness 08/23/2013   Neuropathy (HCC) 08/21/2013   Chronic bilateral low back pain with left-sided sciatica 10/09/2011   History of bladder cancer 04/01/2010   Localized osteoarthrosis, lower leg 10/04/2009   Generalized abdominal pain 01/31/2009   Tinnitus 05/05/2007   Essential hypertension 02/21/2007   OSA (obstructive sleep apnea) 11/02/2006   Hyperlipidemia 10/12/2006   Coronary atherosclerosis 10/12/2006   Asthma 10/12/2006   GERD 10/12/2006    PCP: Katrinka Garnette KIDD, MD  REFERRING PROVIDER: Joane Artist RAMAN, MD  REFERRING DIAG: Chronic bilateral low back pain without sciatica  Rationale for Evaluation and Treatment: Rehabilitation  THERAPY DIAG:  Other low back pain  Muscle weakness (generalized)  ONSET DATE: Chronic   SUBJECTIVE SUBJECTIVE STATEMENT: Patient reports his right knee is still bothering him. He does report he got some cramps when doing the band exercises pulling back. The lower back is feeling better, it doesn't hurt as much.  Eval: Patient reports he has had back pain for a long time with no mechanism of injury. He tried pool therapy at least 3 years ago and he didn't like that. Today his back pain is good, most days are ok, and then occasionally he has severe pain. He thinks lifting heavy objects or combine squatting and bending to clean the litter  box causes his pain to get worse. He reports difficulty getting out of bed in the morning. Pain is located to the lower back, across the back and worse on the left side. He has had epidurals in his back that worked for a while and then stopped working. He does report painful peripheral neuropathy in his feet that seems to affect his balance.   PERTINENT HISTORY:  See PMH above  PAIN:  Are you having pain? Yes:  NPRS scale: 5/10 currently, 7/10 at worst Pain location: Lower back Pain description: pushing type of pain Aggravating factors: Lifting, squatting and bending Relieving factors: Massage, heat, Tylenol   PRECAUTIONS: None  PATIENT GOALS: Improve pain   OBJECTIVE:  Note: Objective measures were completed at Evaluation unless otherwise noted. PATIENT SURVEYS:  Modified Oswestry: 24% disability (12/50)  MUSCLE LENGTH:  Limitation   POSTURE:  Decreased lumbar lordosis, rounded shoulder posture, forward trunk lean  PALPATION: Tender to palpation across lumbar region, L > R  LUMBAR ROM:   AROM eval  Flexion 25%  Extension 50%  Right lateral flexion 25%  Left lateral flexion 25%  Right rotation 50%  Left rotation 25%   (Blank rows = not tested)  LOWER EXTREMITY ROM:      Hip PROM grossly WFL  LOWER EXTREMITY MMT:    MMT Right eval Left eval  Hip flexion 4 4-  Hip extension 3- 3-  Hip abduction 3- 3-  Hip adduction    Hip internal rotation    Hip external rotation    Knee flexion 4+ 4+  Knee extension 4+ 4  Ankle dorsiflexion    Ankle plantarflexion    Ankle inversion    Ankle eversion     (Blank rows = not tested)  LUMBAR SPECIAL TESTS:  Lumbar radicular testing negative  FUNCTIONAL TESTS:  5 times sit to stand: 18 seconds  GAIT: Assistive device utilized: None Level of assistance: Complete Independence Comments: Forward trunk lean, wide BOS   TREATMENT [facility] Adult PT Treatment:                                                DATE:  02/09/2024 Prone IASTM using theragun for lumbar and thoracic paraspinals Prone thoracic PA mobs Seated stability ball roll out 5 x 5 sec fwd and diagonal Seated thoracic rotation with stability ball between knees and hands behind head, incorporating deep breathing to gain increased range of motion Seated thoracic extension over back of chair with hands behind head, incorporating deep breathing to gain increased range of motion Seated horizontal abduction with yellow 2 x 10 Seated hamstring stretch 3 x 20 sec each  PATIENT EDUCATION:  Education details: HEP Person educated: Patient Education method: Programmer, Multimedia, Demonstration, Actor cues, Verbal cues Education comprehension: verbalized understanding, returned demonstration, verbal cues required, tactile cues required, and needs further education  HOME EXERCISE PROGRAM: Access Code: [email or web address]    ASSESSMENT: CLINICAL IMPRESSION: Patient tolerated therapy well with no adverse effects. Therapy focused more on thoracic mobility and postural control this visit. Incorporated deep breathing with thoracic mobility exercises and continued with banded periscapular strengthening. He does report improvement in his lumbar/hip pain this visit. No changes were made to his HEP, he was instructed to continue working on the banded row to improve strength and tolerance for that activity. Patient would benefit from continued skilled PT to progress mobility and strength in order to reduce pain and maximize functional ability.   Eval: Patient is a 83 y.o. male who was seen today for physical therapy evaluation and treatment for chronic lower back pain. He exhibits limitations in his lumbar mobility, hip and hamstring flexibility, and gross strength deficits with postural deviations that are contributing to his pain and impacting his functional ability.   OBJECTIVE IMPAIRMENTS: Abnormal gait, decreased activity tolerance, decreased balance, decreased ROM,  decreased strength, impaired flexibility, postural dysfunction, and pain.   ACTIVITY LIMITATIONS: carrying, lifting, bending, standing, squatting, sleeping, and locomotion level  PARTICIPATION LIMITATIONS: cleaning and community activity  PERSONAL FACTORS: Fitness, Past/current experiences, and Time since onset of injury/illness/exacerbation are also affecting patient's functional outcome.    GOALS: Goals reviewed with patient? No  SHORT TERM GOALS: Target date: 01/21/2024  Patient will  be I with initial HEP in order to progress with therapy. Baseline: HEP provided at eval 02/09/2024: independent Goal status: MET  2.  Patient will report low back pain </= 4/10 at worst in order to reduce functional limitations Baseline: 7/10 02/09/2024: 7/10 Goal status: ONGOING  LONG TERM GOALS: Target date: 02/18/2024  Patient will be I with final HEP to maintain progress from PT. Baseline: HEP provided at eval Goal status: INITIAL  2.  Patient will report Modified Oswestry </= 6/50 (12% disability) in order to indicate an improvement in their functional status Baseline: 24% Goal status: INITIAL  3.  Patient will demonstrate a 25% improvement in his lumbar motion in order to improve his ability to bend and clean the litter box at home Baseline: see limitations above Goal status: INITIAL  4.  Patient will exhibit hip strength >/= 4-/5 MMT in order to improve standing, walking, and lifting tolerance Baseline: grossly 3-/5 MMT Goal status: INITIAL  5. Patient will demonstrate 5xSTS </= 12 seconds in order to indicate improved LE strength  Baseline: 18 seconds  Goal status: INITIAL   PLAN: PT FREQUENCY: 1x/week  PT DURATION: 8 weeks  PLANNED INTERVENTIONS: 97164- PT Re-evaluation, 97750- Physical Performance Testing, 97110-Therapeutic exercises, 97530- Therapeutic activity, 97112- Neuromuscular re-education, 97535- Self Care, 02859- Manual therapy, 20560 (1-2 muscles), 20561 (3+  muscles)- Dry Needling, Patient/Family education, Joint mobilization, Joint manipulation, Spinal manipulation, Spinal mobilization, Cryotherapy, and Moist heat.  PLAN FOR NEXT SESSION: Review HEP and progress PRN, manual/stretching for lumbar spine, hips, and hamstring, progress core stabilization and hip/LE strengthening as tolerated; further assessment of balance   Elaine Daring, PT, DPT, LAT, ATC 02/09/24  11:02 AM Phone: [phone] Fax: [phone]

## 2024-02-16 ENCOUNTER — Telehealth: Payer: Self-pay | Admitting: Pharmacist

## 2024-02-16 NOTE — Telephone Encounter (Signed)
 No that sounds perfect- thanks

## 2024-02-16 NOTE — Telephone Encounter (Signed)
 Sorry meant to send to Tammy- not team!

## 2024-02-16 NOTE — Telephone Encounter (Signed)
 Mr. Langille left a message on VM of Clinical Pharmacist with a question about pantoprazole .  Called patient back. He states his wife was concerned about him taking pantoprazole  long term and possible side effects.  Discussed that there are some possible association between PPIs like pantoprazole  and decreased calcium  absorption and bone health, increase in CDiff occurrence, risk of decrease renal function. We usually will try to taper pantoprazole  dose to lowest dose needed or to discontinue if possible as long as he has not other indication for PPI like Barrett's esophagus or history of GI ulcer.   Last EGD was 12/17/2008 showed normal esophagus and no ulcer - see below  Advised patient he would change pantoprazole  40mg  from daily to every OTHER day. Patient to follow up with PCP 02/29/2024 as planned. If no changes in GERD could lower dose to pantoprazole  20mg  every OTHER day or pantoprazole  40mg  every 3 - 4 days and eventually taper off.   Madelin Ray, PharmD Clinical Pharmacist [facility]  Population Health [phone]   Summary: Endoscopy     EGD   Procedure date:  12/12/2008   Findings:      Location: [facility]    ENDOSCOPY PROCEDURE REPORT   PATIENT:  Joshua [name], Joshua [name]  MR#:  [id number] BIRTHDATE:   03/25/1940, 67 yrs. old   GENDER:   male   ENDOSCOPIST:   Alm SAUNDERS. Jakie, MD, [facility] Referred by:     PROCEDURE DATE:  12/12/2008 PROCEDURE:  EGD with biopsy ASA CLASS:   Class II INDICATIONS: iron deficiency anemia, GERD    MEDICATIONS:    Fentanyl  25 mcg IV, Versed  2 mg IV, glycopyrrolate (Robinal) 0.2 mg IV TOPICAL ANESTHETIC:   Exactacain Spray   DESCRIPTION OF PROCEDURE:   After the risks benefits and alternatives of the procedure were thoroughly explained, informed consent was obtained.  The [facility] GIF-H180 [id number] endoscope was introduced through the mouth and advanced to the second portion of the duodenum, without limitations.  The instrument  was slowly withdrawn as the mucosa was fully examined. <<PROCEDUREIMAGES>>        <<OLD IMAGES>>   A hiatal hernia was found. 2 cm. 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  Normal duodenal folds were noted. small bowel biopsy done.  The stomach was entered and closely examined. The antrum, angularis, and lesser curvature were well visualized, including a retroflexed view of the cardia and fundus. The stomach wall was normally distensable. The scope passed easily through the pylorus into the duodenum.  The esophagus and gastroesophageal junction were completely normal in appearance.    Retroflexed views revealed no abnormalities.    The scope was then withdrawn from the patient and the procedure completed.   COMPLICATIONS:   None   ENDOSCOPIC IMPRESSION:   1) Hiatal hernia   2) Normal duodenal folds   3) Normal stomach   4) Normal esophagus   R/O CELIAC DISEASE.CHRONIC NONEROSIVE GERD.NO BARRETT'S MUCOSA NOTED. RECOMMENDATIONS:   1) await biopsy results   2) continue current meds   REPEAT EXAM:   No

## 2024-02-21 ENCOUNTER — Encounter: Payer: Self-pay | Admitting: Physical Therapy

## 2024-02-21 ENCOUNTER — Other Ambulatory Visit: Payer: Self-pay

## 2024-02-21 ENCOUNTER — Ambulatory Visit: Admitting: Physical Therapy

## 2024-02-21 DIAGNOSIS — M6281 Muscle weakness (generalized): Secondary | ICD-10-CM

## 2024-02-21 DIAGNOSIS — M5459 Other low back pain: Secondary | ICD-10-CM | POA: Diagnosis not present

## 2024-02-21 NOTE — Progress Notes (Signed)
 This encounter was conducted via the [employer] financial providing interactive audio and visual communication.  The patient provided verbal consent to conduct a virtual appointment.  The patient was located at their primary residence during this encounter.  Referring Physician(s): Artist Lloyd, MD   Chief Complaint: The patient is seen in virtual video follow up today s/p right geniculate artery embolization 01/14/24  History of present illness:  Joshua [name], 83 year old male, has a medical history significant for bilateral knee osteoarthritis status post left total knee arthoplasty in 2022 with persistent, worsening pain since the operation [facility] 57/96). He underwent a technically successful left geniculate artery embolization 03/08/23. His pain significantly improved after the procedure.   He fell after tripping on his rug and had persistent left knee pain similar to pre-GAE. Imaging obtained was negative for acute findings and conservative measures were encouraged. He followed up with me 12/13/23 and reported near-resolution of his left knee pain. His main complaint during that visit was right knee pain. He rated his pain a 7/10 and it prevented him from sleeping. He is limited on which pain medications he can take due to comorbidities and he also wished avoid surgery on the right knee. He requested a right geniculate artery embolization and this was completed 01/14/24.   He presents today via virtual video visit for follow up.   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   Blood transfusion without reported diagnosis 11/1999   CAD (coronary artery disease)    CABG 2001   Depression    Diverticulosis    DJD (degenerative joint disease)    BACK   GERD  (gastroesophageal reflux disease)    H/O inguinal hernia repair 12/2018   History of gout    Hyperlipidemia    Hypertension    IBS (irritable bowel syndrome)    Obesity    OSA (obstructive sleep apnea)    USES CPAP    Pancreatitis ~ 1980   Peripheral vascular disease    Pre-diabetes    Prostate cancer (HCC) 04/2010   Sleep apnea     Past Surgical History:  Procedure Laterality Date   ANTERIOR CERVICAL DECOMP/DISCECTOMY FUSION  05/26/2006   PLATE PLACED   BACK SURGERY     CARDIAC CATHETERIZATION  11/20/1999   CORONARY ARTERY BYPASS GRAFT  11/20/1999   SVG-RI1-RI2, SVG-OM, SVG-dRCA   CORONARY STENT INTERVENTION N/A 04/13/2019   Procedure: CORONARY STENT INTERVENTION;  Surgeon: Verlin Lonni BIRCH, MD;  Location: [facility];  Service: Cardiovascular;  Laterality: N/A;   HERNIA REPAIR     INGUINAL HERNIA REPAIR Left 01/02/2019   Procedure: OPEN LEFT INGUINAL HERNIA REPAIR WITH MESH;  Surgeon: Gladis Cough, MD;  Location: [facility];  Service: General;  Laterality: Left;   IR EMBO ARTERIAL NOT HEMORR HEMANG INC GUIDE ROADMAPPING  03/08/2023   IR EMBO ARTERIAL NOT HEMORR HEMANG INC GUIDE ROADMAPPING  01/14/2024   IR RADIOLOGIST EVAL & MGMT  02/05/2023   IR RADIOLOGIST EVAL & MGMT  04/05/2023   IR RADIOLOGIST EVAL & MGMT  04/09/2023   IR RADIOLOGIST EVAL & MGMT  04/30/2023   IR RADIOLOGIST EVAL & MGMT  12/13/2023   JOINT REPLACEMENT  Left knee   KNEE  ARTHROSCOPY Right 1992; 04/2008   LAPAROSCOPIC CHOLECYSTECTOMY  2007   LEFT HEART CATH AND CORS/GRAFTS ANGIOGRAPHY N/A 04/13/2019   Procedure: LEFT HEART CATH AND CORS/GRAFTS ANGIOGRAPHY;  Surgeon: Verlin Lonni BIRCH, MD;  Location: [facility];  Service: Cardiovascular;  Laterality: N/A;   NASAL SEPTUM SURGERY Right 2007   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   ROBOT ASSISTED LAPAROSCOPIC RADICAL PROSTATECTOMY  04/2010   SPINE SURGERY     THYROIDECTOMY, PARTIAL  1980   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    Procedure: LEFT TOTAL KNEE ARTHROPLASTY;  Surgeon: Sheril Coy, MD;  Location: [facility];  Service: Orthopedics;  Laterality: Left;    Allergies: Codeine, Lexapro  [escitalopram ], Bromfed, Clarithromycin, Cymbalta  [duloxetine  hcl], Doxycycline hyclate, Modafinil, Oxycodone -acetaminophen , Promethazine hcl, Quinolones, Telithromycin, Tramadol , Benadryl [diphenhydramine], Hydrocodone -acetaminophen , Repatha  [evolocumab ], Statins, and Sulfonamide derivatives  Medications: Prior to Admission medications   Medication Sig Start Date End Date Taking? Authorizing Provider  acetaminophen  (TYLENOL ) 500 MG tablet Take 1,000 mg by mouth every 6 (six) hours as needed for moderate pain or headache.    [provider]  albuterol  (PROAIR  HFA) 108 (90 Base) MCG/ACT inhaler Inhale 1-2 puffs into the lungs every 6 (six) hours as needed for wheezing or shortness of breath. 12/06/21   Lucius Krabbe, NP  Alirocumab  (PRALUENT ) 150 MG/ML SOAJ INJECT CONTENTS OF 1 PEN (150MG ) INTO THE SKIN EVERY 14 DAYS 12/28/23   Lavona Agent, MD  amLODipine  (NORVASC ) 5 MG tablet TAKE 1 TABLET (5 MG TOTAL) BY MOUTH DAILY. 02/01/24   Lavona Agent, MD  aspirin  EC 81 MG tablet Take 1 tablet (81 mg total) by mouth 2 (two) times daily after a meal. 06/18/20   Lenis Barter, PA-C  cholecalciferol (VITAMIN D3) 25 MCG (1000 UNIT) tablet Take 1,000 Units by mouth daily.    [provider]  Cyanocobalamin  (B-12) 2500 MCG TABS Take 1,250 mcg by mouth daily.    [provider]  diazepam  (VALIUM ) 5 MG tablet TAKE 1 TABLET (5 MG TOTAL) BY MOUTH AT BEDTIME AS NEEDED (SLEEP). 04/16/23   Katrinka Garnette KIDD, MD  dicyclomine  (BENTYL ) 10 MG capsule Take 1 capsule (10 mg total) by mouth in the morning and at bedtime. 01/19/24   [place], PA-C  dicyclomine  (BENTYL ) 10 MG capsule Take 1 capsule (10 mg total) by mouth in the morning and at bedtime. 01/19/24   [place], PA-C  dicyclomine  (BENTYL ) 10 MG capsule Take 1  capsule (10 mg total) by mouth 2 (two) times daily as needed for spasms (abdominal cramping). 01/25/24   [place], PA-C  EPINEPHrine  0.3 mg/0.3 mL IJ SOAJ injection Inject 0.3 mg into the muscle as needed for anaphylaxis. for allergic reaction 04/24/19   [provider]  ezetimibe  (ZETIA ) 10 MG tablet TAKE 1 TABLET BY MOUTH EVERY DAY 10/04/23   Lavona Agent, MD  folic acid  (FOLVITE ) 800 MCG tablet Take 800 mcg by mouth daily.     [provider]  gabapentin  (NEURONTIN ) 300 MG capsule Take 2 cap in AM, 1 cap at noon, 3 caps at bedtime 05/26/23   Georjean Darice HERO, MD  hydrochlorothiazide  (HYDRODIURIL ) 25 MG tablet TAKE 1 TABLET BY MOUTH EVERY DAY 10/04/23   Katrinka Garnette KIDD, MD  ketoconazole (NIZORAL) 2 % cream Apply 1 Application topically 2 (two) times daily as needed. 01/25/23   [provider]  Loratadine  (CLARITIN  PO) Take by mouth at bedtime.    [provider]  MYRBETRIQ  50 MG TB24 tablet TAKE 1 TABLET BY MOUTH  EVERY DAY (REPLACES GEMTESA ) 08/30/23   Katrinka Garnette KIDD, MD  nitroGLYCERIN  (NITROSTAT ) 0.4 MG SL tablet PLACE 1 TABLET UNDER THE TONGUE EVERY 5 MINUTES AS NEEDED FOR CHEST PAIN. 01/17/24   Katrinka Garnette KIDD, MD  pantoprazole  (PROTONIX ) 40 MG tablet TAKE 1 TABLET BY MOUTH EVERY DAY 02/01/24   Lavona Agent, MD  polyethylene glycol powder (GLYCOLAX /MIRALAX ) powder Take 17 g by mouth daily. 08/29/14   Agent Anes, MD  valsartan  (DIOVAN ) 320 MG tablet TAKE 1 TABLET BY MOUTH EVERY DAY 06/25/23   Katrinka Garnette KIDD, MD     Family History  Problem Relation Age of Onset   Heart disease Mother    Hyperlipidemia Mother    COPD Mother    Heart disease Father    Hyperlipidemia Father    Alcohol abuse Father    Obesity Father    Diabetes Brother    Other Brother        accidental injury leading to long term GI complications- seerious surgeyr 2025 planned   Dementia Brother        incontinence   Colon cancer Neg Hx    Pancreatic cancer Neg Hx     Esophageal cancer Neg Hx    Stomach cancer Neg Hx    Liver cancer Neg Hx     Social History   Socioeconomic History   Marital status: Married    Spouse name: Not on file   Number of children: Not on file   Years of education: Not on file   Highest education level: Associate degree: occupational, scientist, product/process development, or vocational program  Occupational History   Occupation: retired    Associate Professor: RETIRED  Tobacco Use   Smoking status: Former    Current packs/day: 0.00    Average packs/day: 1 pack/day for 35.0 years (35.0 ttl pk-yrs)    Types: Cigarettes    Start date: 06/16/1957    Quit date: 06/16/1992    Years since quitting: 31.7    Passive exposure: Past   Smokeless tobacco: Never  Vaping Use   Vaping status: Never Used  Substance and Sexual Activity   Alcohol use: Yes    Alcohol/week: 2.0 standard drinks of alcohol    Types: 2 Shots of liquor per week    Comment: occ   Drug use: No   Sexual activity: Not Currently    Partners: Female  Other Topics Concern   Not on file  Social History Narrative   Married 42 years in 2015. 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      Hobbies: poker, movies and tv, staying active      Right handed   2 level home with steps he uses   Social Drivers of Corporate Investment Banker Strain: Low Risk  (10/25/2023)   Overall Financial Resource Strain (CARDIA)    Difficulty of Paying Living Expenses: Not hard at all  Food Insecurity: No Food Insecurity (10/25/2023)   Hunger Vital Sign    Worried About Running Out of Food in the Last Year: Never true    Ran Out of Food in the Last Year: Never true  Transportation Needs: No Transportation Needs (10/25/2023)   PRAPARE - Administrator, Civil Service (Medical): No    Lack of Transportation (Non-Medical): No  Physical Activity: Inactive (10/25/2023)   Exercise Vital Sign    Days of Exercise per Week: 0  days    Minutes of  Exercise per Session: Not on file  Stress: No Stress Concern Present (10/25/2023)   [employer] of Occupational Health - Occupational Stress Questionnaire    Feeling of Stress: Only a little  Social Connections: Moderately Isolated (10/25/2023)   Social Connection and Isolation Panel    Frequency of Communication with Friends and Family: More than three times a week    Frequency of Social Gatherings with Friends and Family: Three times a week    Attends Religious Services: Never    Active Member of Clubs or Organizations: No    Attends Banker Meetings: Not on file    Marital Status: Married     Vital Signs: There were no vitals taken for this visit.  Physical Exam Patient is alert, oriented and able to participate fully in the conversation. No apparent discomfort or distress observed. He appears appropriately dressed.    Imaging:  Left knee 04/30/23    Left GAE 03/08/23     Right knee 06/25/22  Kellgren and Lawrence Grade II   Right GAE 01/14/24        Labs:  CBC: Recent Labs    05/25/23 0954 07/10/23 1254 10/27/23 1233 12/17/23 1316  WBC 7.9 7.9 5.8 7.9  HGB 14.5 14.9 14.2 14.6  HCT 42.0 41.8 41.4 44.0  PLT 256.0 207 215.0 211    COAGS: No results for input(s): INR, APTT in the last 8760 hours.  BMP: Recent Labs    03/18/23 0725 05/25/23 0954 07/10/23 1254 10/27/23 1233 12/17/23 1316  NA 134* 138 139 134* 137  K 3.5 3.7 3.9 4.2 4.9  CL 99 102 104 98 98  CO2 24 28 26 29 22   GLUCOSE 154* 96 89 96 115*  BUN 16 22 17 20 18   CALCIUM  9.0 10.0 9.6 9.6 10.0  CREATININE 1.31* 1.24 1.24 1.21 1.28*  GFRNONAA 55*  --  59*  --   --     LIVER FUNCTION TESTS: Recent Labs    05/25/23 0954 07/10/23 1254 10/27/23 1233 12/17/23 1316  BILITOT 0.7 0.7 0.8 0.9  AST 18 21 15 15   ALT 19 21 17 17   ALKPHOS 51 58 50 75  PROT 6.7 6.6 6.9 6.7  ALBUMIN 4.0 3.6 4.3 4.3    Assessment and Plan:  83 year old male with a history of  bilateral knee osteoarthritis status post left total knee arthoplasty in 2022 with persistent, worsening pain since the operation [facility] 57/96). He attempted conservative measures without any significant benefit. He also has some moderate right knee pain secondary to osteoarthritis. He underwent a technically successful left geniculate artery embolization 03/08/23 and experienced a significant reduction in his level of left knee pain. A right geniculate artery embolization was performed 01/14/24.   Ester Sides, MD Pager: [phone]    I spent a total of 25 Minutes in virtual video clinical consultation, greater than 50% of which was counseling/coordinating care for right knee pain.

## 2024-02-21 NOTE — Therapy (Signed)
 OUTPATIENT PHYSICAL THERAPY TREATMENT   Patient Name: Joshua [name] MRN: [id number] DOB:Jun 13, 1940, 83 y.o., male Today's Date: 02/21/2024   END OF SESSION:  PT End of Session - 02/21/24 0952     Visit Number 6    Number of Visits 14    Date for Recertification  04/17/24    Authorization Type [employer]    Progress Note Due on Visit 10    PT Start Time 0930    PT Stop Time 1015    PT Time Calculation (min) 45 min    Activity Tolerance Patient tolerated treatment well    Behavior During Therapy [facility] for tasks assessed/performed               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   Blood transfusion without reported diagnosis 11/1999   CAD (coronary artery disease)    CABG 2001   Depression    Diverticulosis    DJD (degenerative joint disease)    BACK   GERD (gastroesophageal reflux disease)    H/O inguinal hernia repair 12/2018   History of gout    Hyperlipidemia    Hypertension    IBS (irritable bowel syndrome)    Obesity    OSA (obstructive sleep apnea)    USES CPAP    Pancreatitis ~ 1980   Peripheral vascular disease    Pre-diabetes    Prostate cancer (HCC) 04/2010   Sleep apnea    Past Surgical History:  Procedure Laterality Date   ANTERIOR CERVICAL DECOMP/DISCECTOMY FUSION  05/26/2006   PLATE PLACED   BACK SURGERY     CARDIAC CATHETERIZATION  11/20/1999   CORONARY ARTERY BYPASS GRAFT  11/20/1999   SVG-RI1-RI2, SVG-OM, SVG-dRCA   CORONARY STENT INTERVENTION N/A 04/13/2019   Procedure: CORONARY STENT INTERVENTION;  Surgeon: Verlin Lonni BIRCH, MD;  Location: [facility];  Service: Cardiovascular;  Laterality: N/A;   HERNIA REPAIR     INGUINAL HERNIA REPAIR Left 01/02/2019   Procedure: OPEN LEFT INGUINAL  HERNIA REPAIR WITH MESH;  Surgeon: Gladis Cough, MD;  Location: [facility];  Service: General;  Laterality: Left;   IR EMBO ARTERIAL NOT HEMORR HEMANG INC GUIDE ROADMAPPING  03/08/2023   IR EMBO ARTERIAL NOT HEMORR HEMANG INC GUIDE ROADMAPPING  01/14/2024   IR RADIOLOGIST EVAL & MGMT  02/05/2023   IR RADIOLOGIST EVAL & MGMT  04/05/2023   IR RADIOLOGIST EVAL & MGMT  04/09/2023   IR RADIOLOGIST EVAL & MGMT  04/30/2023   IR RADIOLOGIST EVAL & MGMT  12/13/2023   JOINT REPLACEMENT  Left knee   KNEE ARTHROSCOPY Right 1992; 04/2008   LAPAROSCOPIC CHOLECYSTECTOMY  2007   LEFT HEART CATH AND CORS/GRAFTS ANGIOGRAPHY N/A 04/13/2019   Procedure: LEFT HEART CATH AND CORS/GRAFTS ANGIOGRAPHY;  Surgeon: Verlin Lonni BIRCH, MD;  Location: [facility];  Service: Cardiovascular;  Laterality: N/A;   NASAL SEPTUM SURGERY Right 2007   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   ROBOT ASSISTED LAPAROSCOPIC RADICAL PROSTATECTOMY  04/2010   SPINE SURGERY     THYROIDECTOMY, PARTIAL  1980   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   Procedure: LEFT TOTAL KNEE ARTHROPLASTY;  Surgeon: Sheril Coy, MD;  Location: [facility];  Service: Orthopedics;  Laterality: Left;   Patient Active Problem List   Diagnosis Date Noted   Scrotal pain 02/11/2023   De Quervain's tenosynovitis, right 10/28/2022   Calcium  oxalate crystals in urine 07/30/2022   Balanitis 07/06/2022   Urinary frequency 07/06/2022   Degenerative arthritis of right knee 06/08/2022   Left hamstring injury 04/21/2022   Degenerative cervical disc 01/12/2022   Greater trochanteric bursitis of right hip 02/17/2021   Hyperactive bowel sounds 01/29/2021   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 09/25/2020   Primary osteoarthritis of left knee 06/18/2020   Trigger thumb, left thumb 01/24/2020   Overactive bladder 11/24/2019   Left lateral epicondylitis 09/05/2019   Greater trochanteric bursitis of left hip 10/27/2018   Degenerative disc  disease, lumbar 10/27/2018   Abdominal muscle strain, initial encounter 06/16/2018   Aortic atherosclerosis 05/05/2018   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   Hyperglycemia 10/07/2017   Weakness of left leg 06/21/2017   DJD (degenerative joint disease)    Arthritis    Neck pain 02/08/2017   Venous insufficiency 12/29/2016   Ganglion cyst of tendon sheath of right hand 12/29/2016   Gout 11/20/2016   Obesity, Class I, BMI 30-34.9 10/03/2015   Hereditary and idiopathic peripheral neuropathy 09/25/2014   Allergic rhinitis 05/31/2014   Fatigue 11/09/2013   CKD (chronic kidney disease), stage III (HCC) 10/23/2013   Dizziness 08/23/2013   Neuropathy (HCC) 08/21/2013   Chronic bilateral low back pain with left-sided sciatica 10/09/2011   History of bladder cancer 04/01/2010   Localized osteoarthrosis, lower leg 10/04/2009   Generalized abdominal pain 01/31/2009   Tinnitus 05/05/2007   Essential hypertension 02/21/2007   OSA (obstructive sleep apnea) 11/02/2006   Hyperlipidemia 10/12/2006   Coronary atherosclerosis 10/12/2006   Asthma 10/12/2006   GERD 10/12/2006    PCP: Katrinka Garnette KIDD, MD  REFERRING PROVIDER: Joane Artist RAMAN, MD  REFERRING DIAG: Chronic bilateral low back pain without sciatica  Rationale for Evaluation and Treatment: Rehabilitation  THERAPY DIAG:  Other low back pain  Muscle weakness (generalized)  ONSET DATE: Chronic   SUBJECTIVE SUBJECTIVE STATEMENT: Patient reports his back wasn't feeling too bad but then he vacuumed underneath the bed and that aggravated his back.  Eval: Patient reports he has had back pain for a long time with no mechanism of injury. He tried pool therapy at least 3 years ago and he didn't like that. Today his back pain is good, most days are ok, and then occasionally he has severe pain. He thinks lifting heavy objects or combine squatting and bending to clean the litter box causes his pain to get worse. He reports difficulty getting out of  bed in the morning. Pain is located to the lower back, across the back and worse on the left side. He has had epidurals in his back that worked for a while and then stopped working. He does report painful peripheral neuropathy in his feet that seems to affect his balance.   PERTINENT HISTORY:  See PMH above  PAIN:  Are you having pain? Yes:  NPRS scale: 5/10 currently, 7/10 at worst Pain location: Lower back Pain description: pushing type of pain Aggravating factors: Lifting, squatting and bending Relieving factors: Massage, heat, Tylenol   PRECAUTIONS: None  PATIENT GOALS: Improve pain   OBJECTIVE:  Note: Objective measures were completed at Evaluation unless otherwise noted. PATIENT SURVEYS:  Modified Oswestry: 24% disability (12/50)  02/21/2024: 20% (10/50)  MUSCLE LENGTH: Limitation   POSTURE:  Decreased lumbar lordosis, rounded shoulder  posture, forward trunk lean  PALPATION: Tender to palpation across lumbar region, L > R  LUMBAR ROM:   AROM eval 02/21/2024  Flexion 25% 50%  Extension 50% 50%  Right lateral flexion 25% 50%  Left lateral flexion 25% 50%  Right rotation 50% 50%  Left rotation 25% 50%   (Blank rows = not tested)  LOWER EXTREMITY ROM:      Hip PROM grossly [facility]  LOWER EXTREMITY MMT:    MMT Right eval Left eval Rt / Lt 02/21/2024  Hip flexion 4 4-   Hip extension 3- 3- 3 / 3  Hip abduction 3- 3- 3 / 3  Hip adduction     Hip internal rotation     Hip external rotation     Knee flexion 4+ 4+   Knee extension 4+ 4   Ankle dorsiflexion     Ankle plantarflexion     Ankle inversion     Ankle eversion      (Blank rows = not tested)  LUMBAR SPECIAL TESTS:  Lumbar radicular testing negative  FUNCTIONAL TESTS:  5 times sit to stand: 18 seconds  02/21/2024: 15 seconds  GAIT: Assistive device utilized: None Level of assistance: Complete Independence Comments: Forward trunk lean, wide BOS   TREATMENT [facility] Adult PT Treatment:                                                 DATE: 02/21/2024 Prone IASTM using theragun for lumbar and thoracic paraspinals Prone lumbar and thoracic PA mobs Seated stability ball roll out 5 x 5 sec fwd and diagonal Seated hamstring stretch 3 x 20 sec each Seated thoracic rotation with stability ball between knees and hands behind head, incorporating deep breathing to gain increased range of motion x 5 each Seated thoracic extension over back of chair with hands behind head, incorporating deep breathing to gain increased range of motion x 5 Sit to stand 3 x 5 Seated horizontal abduction with yellow 2 x 10  PATIENT EDUCATION:  Education details: POC extension, HEP Person educated: Patient Education method: Explanation, Demonstration, Tactile cues, Verbal cues Education comprehension: verbalized understanding, returned demonstration, verbal cues required, tactile cues required, and needs further education  HOME EXERCISE PROGRAM: Access Code: [email or web address]    ASSESSMENT: CLINICAL IMPRESSION: Patient tolerated therapy well with no adverse effects. He does report some improvement in his functional status and demonstrates slight improvement in his lumbar motion and hip strength. Therapy continues to focus primarily on improving his spinal mobility and postural control with good tolerance. Overall he does seem to be improving and his main limitation continues to be his knees which limit his ability to perform certain exercises in therapy. Updated his HEP to progress periscapular strengthening. Patient would benefit from continued skilled PT to progress mobility and strength in order to reduce pain and maximize functional ability, so will extend PT POC for 8 more weeks.   Eval: Patient is a 83 y.o. male who was seen today for physical therapy evaluation and treatment for chronic lower back pain. He exhibits limitations in his lumbar mobility, hip and hamstring flexibility, and gross strength deficits with  postural deviations that are contributing to his pain and impacting his functional ability.   OBJECTIVE IMPAIRMENTS: Abnormal gait, decreased activity tolerance, decreased balance, decreased ROM, decreased strength, impaired flexibility, postural dysfunction, and pain.  ACTIVITY LIMITATIONS: carrying, lifting, bending, standing, squatting, sleeping, and locomotion level  PARTICIPATION LIMITATIONS: cleaning and community activity  PERSONAL FACTORS: Fitness, Past/current experiences, and Time since onset of injury/illness/exacerbation are also affecting patient's functional outcome.    GOALS: Goals reviewed with patient? No  SHORT TERM GOALS: Target date: 03/20/2024  Patient will be I with initial HEP in order to progress with therapy. Baseline: HEP provided at eval 02/09/2024: independent Goal status: MET  2.  Patient will report low back pain </= 4/10 at worst in order to reduce functional limitations Baseline: 7/10 02/09/2024: 7/10 02/21/2024: 7/10 Goal status: ONGOING  LONG TERM GOALS: Target date: 04/17/2024  Patient will be I with final HEP to maintain progress from PT. Baseline: HEP provided at eval 02/21/2024: progressing Goal status: ONGOING  2.  Patient will report Modified Oswestry </= 6/50 (12% disability) in order to indicate an improvement in their functional status Baseline: 24% 02/21/2024: 20% Goal status: ONGOING  3.  Patient will demonstrate a 25% improvement in his lumbar motion in order to improve his ability to bend and clean the litter box at home Baseline: see limitations above 02/21/2024: see limitations above Goal status: ONGOING  4.  Patient will exhibit hip strength >/= 4-/5 MMT in order to improve standing, walking, and lifting tolerance Baseline: grossly 3-/5 MMT 02/21/2024: grossly 3/5 MMT Goal status: ONGOING  5. Patient will demonstrate 5xSTS </= 12 seconds in order to indicate improved LE strength  Baseline: 18 seconds 02/21/2024: 15  seconds  Goal status: ONGOING   PLAN: PT FREQUENCY: 1x/week  PT DURATION: 8 weeks  PLANNED INTERVENTIONS: 97164- PT Re-evaluation, 97750- Physical Performance Testing, 97110-Therapeutic exercises, 97530- Therapeutic activity, 97112- Neuromuscular re-education, 97535- Self Care, 02859- Manual therapy, 20560 (1-2 muscles), 20561 (3+ muscles)- Dry Needling, Patient/Family education, Joint mobilization, Joint manipulation, Spinal manipulation, Spinal mobilization, Cryotherapy, and Moist heat.  PLAN FOR NEXT SESSION: Review HEP and progress PRN, manual/stretching for lumbar spine, hips, and hamstring, progress core stabilization and hip/LE strengthening as tolerated; further assessment of balance   Elaine Daring, PT, DPT, LAT, ATC 02/21/24  10:31 AM Phone: [phone] Fax: [phone]

## 2024-02-23 ENCOUNTER — Other Ambulatory Visit (HOSPITAL_COMMUNITY): Payer: Self-pay

## 2024-02-23 ENCOUNTER — Telehealth: Payer: Self-pay | Admitting: Cardiology

## 2024-02-23 ENCOUNTER — Inpatient Hospital Stay
Admission: RE | Admit: 2024-02-23 | Discharge: 2024-02-23 | Disposition: A | Source: Ambulatory Visit | Attending: Interventional Radiology

## 2024-02-23 ENCOUNTER — Telehealth: Payer: Self-pay | Admitting: Pharmacy Technician

## 2024-02-23 DIAGNOSIS — M1711 Unilateral primary osteoarthritis, right knee: Secondary | ICD-10-CM

## 2024-02-23 HISTORY — PX: IR RADIOLOGIST EVAL & MGMT: IMG5224

## 2024-02-23 NOTE — Telephone Encounter (Signed)
 Pt c/o medication issue:  1. Name of Medication: Alirocumab  (PRALUENT ) 150 MG/ML SOAJ   2. How are you currently taking this medication (dosage and times per day)? As written   3. Are you having a reaction (difficulty breathing--STAT)? No   4. What is your medication issue? Pt requesting pt assistance for the upcoming year and would also like to know if he's able to take his wife medication she isn't using.

## 2024-02-23 NOTE — Telephone Encounter (Signed)
 Called pt and pt verbalized he already heard from assistance team and had no questions.

## 2024-02-23 NOTE — Telephone Encounter (Signed)
   LF:02/18/24 NF:03/10/24  The patient has a [employer] grant. I called the patient and he said he has not been paying anything for the praluent . He was concerned about next year. I explained I just have to renew his grant and it will continue to be free. I have submitted for a [employer] grant and waiting back for approval. Once approved, I will send to the pharmacy and notify the patient.   Diag verification submitted

## 2024-02-23 NOTE — Telephone Encounter (Signed)
 Called pt and pt request assistance for Praluent . Made pt aware that this will be forward to our medication assistance team. Made pt aware that someone will reach out to him. Understanding verbalized.

## 2024-02-24 NOTE — Telephone Encounter (Signed)
 Still pending

## 2024-02-24 NOTE — Telephone Encounter (Signed)
 Patient Advocate Encounter   The patient was approved for a [employer] grant that will help cover the cost of praluent  Total amount awarded, 2500.  Effective: 01/24/24 - 01/22/25   APW:[id number] ERW:[id number] Hmnle:[id number] PI:[id number] [employer] ID: [id number]   Pharmacy provided with approval and processing information. Patient informed via mychart

## 2024-02-28 ENCOUNTER — Other Ambulatory Visit: Payer: Self-pay

## 2024-02-28 ENCOUNTER — Encounter: Payer: Self-pay | Admitting: Physical Therapy

## 2024-02-28 ENCOUNTER — Ambulatory Visit: Admitting: Physical Therapy

## 2024-02-28 DIAGNOSIS — M6281 Muscle weakness (generalized): Secondary | ICD-10-CM

## 2024-02-28 DIAGNOSIS — M5459 Other low back pain: Secondary | ICD-10-CM | POA: Diagnosis not present

## 2024-02-28 NOTE — Therapy (Signed)
 OUTPATIENT PHYSICAL THERAPY TREATMENT   Patient Name: Joshua [name] MRN: [id number] DOB:05-16-1940, 83 y.o., male Today's Date: 02/28/2024   END OF SESSION:  PT End of Session - 02/28/24 1035     Visit Number 7    Number of Visits 14    Date for Recertification  04/17/24    Authorization Type [employer]    Progress Note Due on Visit 10    PT Start Time 0934    PT Stop Time 1015    PT Time Calculation (min) 41 min    Activity Tolerance Patient tolerated treatment well    Behavior During Therapy [facility] for tasks assessed/performed                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   Blood transfusion without reported diagnosis 11/1999   CAD (coronary artery disease)    CABG 2001   Depression    Diverticulosis    DJD (degenerative joint disease)    BACK   GERD (gastroesophageal reflux disease)    H/O inguinal hernia repair 12/2018   History of gout    Hyperlipidemia    Hypertension    IBS (irritable bowel syndrome)    Obesity    OSA (obstructive sleep apnea)    USES CPAP    Pancreatitis ~ 1980   Peripheral vascular disease    Pre-diabetes    Prostate cancer (HCC) 04/2010   Sleep apnea    Past Surgical History:  Procedure Laterality Date   ANTERIOR CERVICAL DECOMP/DISCECTOMY FUSION  05/26/2006   PLATE PLACED   BACK SURGERY     CARDIAC CATHETERIZATION  11/20/1999   CORONARY ARTERY BYPASS GRAFT  11/20/1999   SVG-RI1-RI2, SVG-OM, SVG-dRCA   CORONARY STENT INTERVENTION N/A 04/13/2019   Procedure: CORONARY STENT INTERVENTION;  Surgeon: Verlin Lonni BIRCH, MD;  [facility]: [facility];  Service: Cardiovascular;  Laterality: N/A;   HERNIA REPAIR     INGUINAL HERNIA REPAIR Left 01/02/2019   Procedure: OPEN LEFT INGUINAL  HERNIA REPAIR WITH MESH;  Surgeon: Gladis Cough, MD;  Location: [facility];  Service: General;  Laterality: Left;   IR EMBO ARTERIAL NOT HEMORR HEMANG INC GUIDE ROADMAPPING  03/08/2023   IR EMBO ARTERIAL NOT HEMORR [employer]ANG [employer] GUIDE ROADMAPPING  01/14/2024   IR RADIOLOGIST EVAL & MGMT  02/05/2023   IR RADIOLOGIST EVAL & MGMT  04/05/2023   IR RADIOLOGIST EVAL & MGMT  04/09/2023   IR RADIOLOGIST EVAL & MGMT  04/30/2023   IR RADIOLOGIST EVAL & MGMT  12/13/2023   IR RADIOLOGIST EVAL & MGMT  02/23/2024   JOINT REPLACEMENT  Left knee   KNEE ARTHROSCOPY Right 1992; 04/2008   LAPAROSCOPIC CHOLECYSTECTOMY  2007   LEFT HEART CATH AND CORS/GRAFTS ANGIOGRAPHY N/A 04/13/2019   Procedure: LEFT HEART CATH AND CORS/GRAFTS ANGIOGRAPHY;  Surgeon: Verlin Lonni BIRCH, MD;  [facility]: [facility];  Service: Cardiovascular;  Laterality: N/A;   NASAL SEPTUM SURGERY Right 2007   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   ROBOT ASSISTED LAPAROSCOPIC RADICAL PROSTATECTOMY  04/2010   SPINE SURGERY     THYROIDECTOMY, PARTIAL  1980   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   Procedure: LEFT TOTAL KNEE ARTHROPLASTY;  Surgeon:  Dalldorf, Peter, MD;  Location: [facility];  Service: Orthopedics;  Laterality: Left;   Patient Active Problem List   Diagnosis Date Noted   Scrotal pain 02/11/2023   De Quervain's tenosynovitis, right 10/28/2022   Calcium  oxalate crystals in urine 07/30/2022   Balanitis 07/06/2022   Urinary frequency 07/06/2022   Degenerative arthritis of right knee 06/08/2022   Left hamstring injury 04/21/2022   Degenerative cervical disc 01/12/2022   Greater trochanteric bursitis of right hip 02/17/2021   Hyperactive bowel sounds 01/29/2021   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 09/25/2020   Primary osteoarthritis of left knee 06/18/2020   Trigger thumb, left thumb 01/24/2020   Overactive bladder 11/24/2019   Left lateral epicondylitis 09/05/2019   Greater trochanteric bursitis of  left hip 10/27/2018   Degenerative disc disease, lumbar 10/27/2018   Abdominal muscle strain, initial encounter 06/16/2018   Aortic atherosclerosis 05/05/2018   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   Hyperglycemia 10/07/2017   Weakness of left leg 06/21/2017   DJD (degenerative joint disease)    Arthritis    Neck pain 02/08/2017   Venous insufficiency 12/29/2016   Ganglion cyst of tendon sheath of right hand 12/29/2016   Gout 11/20/2016   Obesity, Class I, BMI 30-34.9 10/03/2015   Hereditary and idiopathic peripheral neuropathy 09/25/2014   Allergic rhinitis 05/31/2014   Fatigue 11/09/2013   CKD (chronic kidney disease), stage III (HCC) 10/23/2013   Dizziness 08/23/2013   Neuropathy (HCC) 08/21/2013   Chronic bilateral low back pain with left-sided sciatica 10/09/2011   History of bladder cancer 04/01/2010   Localized osteoarthrosis, lower leg 10/04/2009   Generalized abdominal pain 01/31/2009   Tinnitus 05/05/2007   Essential hypertension 02/21/2007   OSA (obstructive sleep apnea) 11/02/2006   Hyperlipidemia 10/12/2006   Coronary atherosclerosis 10/12/2006   Asthma 10/12/2006   GERD 10/12/2006    PCP: Katrinka Garnette KIDD, MD  REFERRING PROVIDER: Joane Artist RAMAN, MD  REFERRING DIAG: Chronic bilateral low back pain without sciatica  Rationale for Evaluation and Treatment: Rehabilitation  THERAPY DIAG:  Other low back pain  Muscle weakness (generalized)  ONSET DATE: Chronic   SUBJECTIVE SUBJECTIVE STATEMENT: Patient reports his back is feeling a lot better than it was. He reports that his right knee has been really bothering him.  Eval: Patient reports he has had back pain for a long time with no mechanism of injury. He tried pool therapy at least 3 years ago and he didn't like that. Today his back pain is good, most days are ok, and then occasionally he has severe pain. He thinks lifting heavy objects or combine squatting and bending to clean the litter box causes his pain to get  worse. He reports difficulty getting out of bed in the morning. Pain is located to the lower back, across the back and worse on the left side. He has had epidurals in his back that worked for a while and then stopped working. He does report painful peripheral neuropathy in his feet that seems to affect his balance.   PERTINENT HISTORY:  See PMH above  PAIN:  Are you having pain? Yes:  NPRS scale: 5/10 currently, 7/10 at worst Pain location: Lower back Pain description: pushing type of pain Aggravating factors: Lifting, squatting and bending Relieving factors: Massage, heat, Tylenol   PRECAUTIONS: None  PATIENT GOALS: Improve pain   OBJECTIVE:  Note: Objective measures were completed at Evaluation unless otherwise noted. PATIENT SURVEYS:  Modified Oswestry: 24% disability (12/50)  02/21/2024: 20% (  10/50)  MUSCLE LENGTH: Limitation   POSTURE:  Decreased lumbar lordosis, rounded shoulder posture, forward trunk lean  PALPATION: Tender to palpation across lumbar region, L > R  LUMBAR ROM:   AROM eval 02/21/2024  Flexion 25% 50%  Extension 50% 50%  Right lateral flexion 25% 50%  Left lateral flexion 25% 50%  Right rotation 50% 50%  Left rotation 25% 50%   (Blank rows = not tested)  LOWER EXTREMITY ROM:      Hip PROM grossly [facility]  LOWER EXTREMITY MMT:    MMT Right eval Left eval Rt / Lt 02/21/2024  Hip flexion 4 4-   Hip extension 3- 3- 3 / 3  Hip abduction 3- 3- 3 / 3  Hip adduction     Hip internal rotation     Hip external rotation     Knee flexion 4+ 4+   Knee extension 4+ 4   Ankle dorsiflexion     Ankle plantarflexion     Ankle inversion     Ankle eversion      (Blank rows = not tested)  LUMBAR SPECIAL TESTS:  Lumbar radicular testing negative  FUNCTIONAL TESTS:  5 times sit to stand: 18 seconds  02/21/2024: 15 seconds  GAIT: Assistive device utilized: None Level of assistance: Complete Independence Comments: Forward trunk lean, wide  BOS   TREATMENT [facility] Adult PT Treatment:                                                DATE: 02/28/2024 Prone IASTM using theragun for lumbar and thoracic paraspinals Prone lumbar and thoracic PA mobs Seated stability ball roll out 5 x 5 sec fwd and diagonal Seated hamstring stretch 3 x 20 sec each Seated thoracic rotation with stability ball between knees and hands behind head, incorporating deep breathing to gain increased range of motion x 5 each Seated thoracic extension over back of chair with hands behind head, incorporating deep breathing to gain increased range of motion x 5 Seated horizontal abduction with yellow 2 x 10 Row with red 2 x 10  PATIENT EDUCATION:  Education details: HEP Person educated: Patient Education method: Programmer, Multimedia, Demonstration, Actor cues, Verbal cues Education comprehension: verbalized understanding, returned demonstration, verbal cues required, tactile cues required, and needs further education  HOME EXERCISE PROGRAM: Access Code: [email or web address]    ASSESSMENT: CLINICAL IMPRESSION: Patient tolerated therapy well with no adverse effects. Therapy continued to focus on progressing his spinal mobility and postural strengthening. He declined performing any sit to stands or LE this visit due to right knee pain. He does report improvement in his back pain but did note that recently reaching up to a higher cabinet caused increased back pain. No changes made to his HEP. Patient would benefit from continued skilled PT to progress mobility and strength in order to reduce pain and maximize functional ability.   Eval: Patient is a 83 y.o. male who was seen today for physical therapy evaluation and treatment for chronic lower back pain. He exhibits limitations in his lumbar mobility, hip and hamstring flexibility, and gross strength deficits with postural deviations that are contributing to his pain and impacting his functional ability.   OBJECTIVE IMPAIRMENTS:  Abnormal gait, decreased activity tolerance, decreased balance, decreased ROM, decreased strength, impaired flexibility, postural dysfunction, and pain.   ACTIVITY LIMITATIONS: carrying, lifting, bending, standing, squatting, sleeping, and  locomotion level  PARTICIPATION LIMITATIONS: cleaning and community activity  PERSONAL FACTORS: Fitness, Past/current experiences, and Time since onset of injury/illness/exacerbation are also affecting patient's functional outcome.    GOALS: Goals reviewed with patient? No  SHORT TERM GOALS: Target date: 03/20/2024  Patient will be I with initial HEP in order to progress with therapy. Baseline: HEP provided at eval 02/09/2024: independent Goal status: MET  2.  Patient will report low back pain </= 4/10 at worst in order to reduce functional limitations Baseline: 7/10 02/09/2024: 7/10 02/21/2024: 7/10 Goal status: ONGOING  LONG TERM GOALS: Target date: 04/17/2024  Patient will be I with final HEP to maintain progress from PT. Baseline: HEP provided at eval 02/21/2024: progressing Goal status: ONGOING  2.  Patient will report Modified Oswestry </= 6/50 (12% disability) in order to indicate an improvement in their functional status Baseline: 24% 02/21/2024: 20% Goal status: ONGOING  3.  Patient will demonstrate a 25% improvement in his lumbar motion in order to improve his ability to bend and clean the litter box at home Baseline: see limitations above 02/21/2024: see limitations above Goal status: ONGOING  4.  Patient will exhibit hip strength >/= 4-/5 MMT in order to improve standing, walking, and lifting tolerance Baseline: grossly 3-/5 MMT 02/21/2024: grossly 3/5 MMT Goal status: ONGOING  5. Patient will demonstrate 5xSTS </= 12 seconds in order to indicate improved LE strength  Baseline: 18 seconds 02/21/2024: 15 seconds  Goal status: ONGOING   PLAN: PT FREQUENCY: 1x/week  PT DURATION: 8 weeks  PLANNED INTERVENTIONS: 97164- PT  Re-evaluation, 97750- Physical Performance Testing, 97110-Therapeutic exercises, 97530- Therapeutic activity, 97112- Neuromuscular re-education, 97535- Self Care, 02859- Manual therapy, 20560 (1-2 muscles), 20561 (3+ muscles)- Dry Needling, Patient/Family education, Joint mobilization, Joint manipulation, Spinal manipulation, Spinal mobilization, Cryotherapy, and Moist heat.  PLAN FOR NEXT SESSION: Review HEP and progress PRN, manual/stretching for lumbar spine, hips, and hamstring, progress core stabilization and hip/LE strengthening as tolerated; further assessment of balance   Elaine Daring, PT, DPT, LAT, ATC 02/28/24  10:36 AM Phone: [phone] Fax: [phone]

## 2024-02-29 ENCOUNTER — Ambulatory Visit: Admitting: Family Medicine

## 2024-02-29 NOTE — Progress Notes (Unsigned)
 [facility] [address] [place] [address] Phone: [phone] Subjective:    I'm seeing this patient by the request  of:  Katrinka Garnette KIDD, MD  CC:   YEP:Dlagzrupcz  12/22/2023 Chronic problem, feels like his left knee is doing much better patient is considering the possibility of a replacement now on this right side.  Discussed with him that would want a wait 2 months until after the injection to truly have the surgery.  Patient is in agreement with the plan.  Will follow-up with his orthopedic surgeon to discuss further.  Due to the instability given a hinged brace      Update 03/01/2024 Joshua [name] is a 83 y.o. male coming in with complaint of R knee pain. 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   Blood transfusion without reported diagnosis 11/1999   CAD (coronary artery disease)    CABG 2001   Depression    Diverticulosis    DJD (degenerative joint disease)    BACK   GERD (gastroesophageal reflux disease)    H/O inguinal hernia repair 12/2018   History of gout    Hyperlipidemia    Hypertension    IBS (irritable bowel syndrome)    Obesity    OSA (obstructive sleep apnea)    USES CPAP    Pancreatitis ~ 1980   Peripheral vascular disease    Pre-diabetes    Prostate cancer (HCC) 04/2010   Sleep apnea    Past Surgical History:  Procedure Laterality Date   ANTERIOR CERVICAL DECOMP/DISCECTOMY FUSION  05/26/2006   PLATE PLACED   BACK SURGERY     CARDIAC CATHETERIZATION  11/20/1999   CORONARY ARTERY BYPASS GRAFT  11/20/1999   SVG-RI1-RI2, SVG-OM, SVG-dRCA   CORONARY STENT INTERVENTION N/A 04/13/2019   Procedure: CORONARY STENT INTERVENTION;  Surgeon: Verlin Lonni BIRCH, MD;   [facility];  Service: Cardiovascular;  Laterality: N/A;   HERNIA REPAIR     INGUINAL HERNIA REPAIR Left 01/02/2019   Procedure: OPEN LEFT INGUINAL HERNIA REPAIR WITH MESH;  Surgeon: Gladis Cough, MD;  Location: [facility];  Service: General;  Laterality: Left;   IR EMBO ARTERIAL NOT HEMORR HEMANG INC GUIDE ROADMAPPING  03/08/2023   IR EMBO ARTERIAL NOT HEMORR HEMANG INC GUIDE ROADMAPPING  01/14/2024   IR RADIOLOGIST EVAL & MGMT  02/05/2023   IR RADIOLOGIST EVAL & MGMT  04/05/2023   IR RADIOLOGIST EVAL & MGMT  04/09/2023   IR RADIOLOGIST EVAL & MGMT  04/30/2023   IR RADIOLOGIST EVAL & MGMT  12/13/2023   IR RADIOLOGIST EVAL & MGMT  02/23/2024   JOINT REPLACEMENT  Left knee   KNEE ARTHROSCOPY Right 1992; 04/2008   LAPAROSCOPIC CHOLECYSTECTOMY  2007   LEFT HEART CATH AND CORS/GRAFTS ANGIOGRAPHY N/A 04/13/2019   Procedure: LEFT HEART CATH AND CORS/GRAFTS ANGIOGRAPHY;  Surgeon: Verlin Lonni BIRCH, MD;  [facility]: [facility];  Service: Cardiovascular;  Laterality: N/A;   NASAL SEPTUM SURGERY Right 2007   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   ROBOT ASSISTED LAPAROSCOPIC RADICAL PROSTATECTOMY  04/2010   SPINE SURGERY     THYROIDECTOMY, PARTIAL  1980   TOTAL  KNEE ARTHROPLASTY Left 06/18/2020   Procedure: LEFT TOTAL KNEE ARTHROPLASTY;  Surgeon: Sheril Coy, MD;  Location: [facility];  Service: Orthopedics;  Laterality: Left;   Social History   Socioeconomic History   Marital status: Married    Spouse name: Not on file   Number of children: Not on file   Years of education: Not on file   Highest education level: Associate degree: occupational, scientist, product/process development, or vocational program  Occupational History   Occupation: retired    Associate Professor: RETIRED  Tobacco Use   Smoking status: Former    Current packs/day: 0.00    Average packs/day: 1 pack/day for 35.0 years (35.0 ttl pk-yrs)    Types: Cigarettes    Start date: 06/16/1957    Quit date: 06/16/1992    Years since quitting:  31.7    Passive exposure: Past   Smokeless tobacco: Never  Vaping Use   Vaping status: Never Used  Substance and Sexual Activity   Alcohol use: Yes    Alcohol/week: 2.0 standard drinks of alcohol    Types: 2 Shots of liquor per week    Comment: occ   Drug use: No   Sexual activity: Not Currently    Partners: Female  Other Topics Concern   Not on file  Social History Narrative   Married 42 years in 2015. 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      Hobbies: poker, movies and tv, staying active      Right handed   2 level home with steps he uses   Social Drivers of Corporate Investment Banker Strain: Low Risk  (10/25/2023)   Overall Financial Resource Strain (CARDIA)    Difficulty of Paying Living Expenses: Not hard at all  Food Insecurity: No Food Insecurity (10/25/2023)   Hunger Vital Sign    Worried About Running Out of Food in the Last Year: Never true    Ran Out of Food in the Last Year: Never true  Transportation Needs: No Transportation Needs (10/25/2023)   PRAPARE - Administrator, Civil Service (Medical): No    Lack of Transportation (Non-Medical): No  Physical Activity: Inactive (10/25/2023)   Exercise Vital Sign    Days of Exercise per Week: 0 days    Minutes of Exercise per Session: Not on file  Stress: No Stress Concern Present (10/25/2023)   [employer] of Occupational Health - Occupational Stress Questionnaire    Feeling of Stress: Only a little  Social Connections: Moderately Isolated (10/25/2023)   Social Connection and Isolation Panel    Frequency of Communication with Friends and Family: More than three times a week    Frequency of Social Gatherings with Friends and Family: Three times a week    Attends Religious Services: Never    Active Member of Clubs or Organizations: No    Attends Engineer, Structural: Not on file    Marital Status: Married   Allergies   Allergen Reactions   Codeine Other (See Comments)    Trouble breathing   Lexapro  [Escitalopram ]     Dizziness   Bromfed Nausea And Vomiting   Clarithromycin Other (See Comments)     gastritis   Cymbalta  [Duloxetine  Hcl]     Abdominal pain and later chest pain as well as worsening fatigue    Doxycycline Hyclate Nausea And Vomiting   Modafinil Other (See Comments)  anxiety-nervousness   Oxycodone -Acetaminophen  Itching   Promethazine Hcl Other (See Comments)     fainting   Quinolones Nausea Only    Cipro  felt real bad   Telithromycin Nausea And Vomiting   Tramadol  Nausea Only   Benadryl [Diphenhydramine] Other (See Comments)    Pt told not to take because of bypass surgery   Hydrocodone -Acetaminophen  Rash   Repatha  [Evolocumab ]     Weak, worn out   Statins Other (See Comments)    Leg cramps and makes patient feel bad. Per pt tried multiple in years past   Sulfonamide Derivatives Rash   Family History  Problem Relation Age of Onset   Heart disease Mother    Hyperlipidemia Mother    COPD Mother    Heart disease Father    Hyperlipidemia Father    Alcohol abuse Father    Obesity Father    Diabetes Brother    Other Brother        accidental injury leading to long term GI complications- seerious surgeyr 2025 planned   Dementia Brother        incontinence   Colon cancer Neg Hx    Pancreatic cancer Neg Hx    Esophageal cancer Neg Hx    Stomach cancer Neg Hx    Liver cancer Neg Hx      Current Outpatient Medications (Cardiovascular):    Alirocumab  (PRALUENT ) 150 MG/ML SOAJ, INJECT CONTENTS OF 1 PEN (150MG ) INTO THE SKIN EVERY 14 DAYS   amLODipine  (NORVASC ) 5 MG tablet, TAKE 1 TABLET (5 MG TOTAL) BY MOUTH DAILY.   EPINEPHrine  0.3 mg/0.3 mL IJ SOAJ injection, Inject 0.3 mg into the muscle as needed for anaphylaxis. for allergic reaction   ezetimibe  (ZETIA ) 10 MG tablet, TAKE 1 TABLET BY MOUTH EVERY DAY   hydrochlorothiazide  (HYDRODIURIL ) 25 MG tablet, TAKE 1 TABLET  BY MOUTH EVERY DAY   nitroGLYCERIN  (NITROSTAT ) 0.4 MG SL tablet, PLACE 1 TABLET UNDER THE TONGUE EVERY 5 MINUTES AS NEEDED FOR CHEST PAIN.   valsartan  (DIOVAN ) 320 MG tablet, TAKE 1 TABLET BY MOUTH EVERY DAY  Current Outpatient Medications (Respiratory):    albuterol  (PROAIR  HFA) 108 (90 Base) MCG/ACT inhaler, Inhale 1-2 puffs into the lungs every 6 (six) hours as needed for wheezing or shortness of breath.   Loratadine  (CLARITIN  PO), Take by mouth at bedtime.  Current Outpatient Medications (Analgesics):    acetaminophen  (TYLENOL ) 500 MG tablet, Take 1,000 mg by mouth every 6 (six) hours as needed for moderate pain or headache.   aspirin  EC 81 MG tablet, Take 1 tablet (81 mg total) by mouth 2 (two) times daily after a meal.  Current Outpatient Medications (Hematological):    Cyanocobalamin  (B-12) 2500 MCG TABS, Take 1,250 mcg by mouth daily.   folic acid  (FOLVITE ) 800 MCG tablet, Take 800 mcg by mouth daily.   Current Outpatient Medications (Other):    cholecalciferol (VITAMIN D3) 25 MCG (1000 UNIT) tablet, Take 1,000 Units by mouth daily.   diazepam  (VALIUM ) 5 MG tablet, TAKE 1 TABLET (5 MG TOTAL) BY MOUTH AT BEDTIME AS NEEDED (SLEEP).   dicyclomine  (BENTYL ) 10 MG capsule, Take 1 capsule (10 mg total) by mouth in the morning and at bedtime.   dicyclomine  (BENTYL ) 10 MG capsule, Take 1 capsule (10 mg total) by mouth in the morning and at bedtime.   dicyclomine  (BENTYL ) 10 MG capsule, Take 1 capsule (10 mg total) by mouth 2 (two) times daily as needed for spasms (abdominal cramping).   gabapentin  (NEURONTIN ) 300 MG  capsule, Take 2 cap in AM, 1 cap at noon, 3 caps at bedtime   ketoconazole (NIZORAL) 2 % cream, Apply 1 Application topically 2 (two) times daily as needed.   MYRBETRIQ  50 MG TB24 tablet, TAKE 1 TABLET BY MOUTH EVERY DAY (REPLACES GEMTESA )   pantoprazole  (PROTONIX ) 40 MG tablet, TAKE 1 TABLET BY MOUTH EVERY DAY   polyethylene glycol powder (GLYCOLAX /MIRALAX ) powder, Take 17 g by  mouth daily.   Reviewed prior external information including notes and imaging from  primary care provider As well as notes that were available from care everywhere and other healthcare systems.  Past medical history, social, surgical and family history all reviewed in electronic medical record.  No pertanent information unless stated regarding to the chief complaint.   Review of Systems:  No headache, visual changes, nausea, vomiting, diarrhea, constipation, dizziness, abdominal pain, skin rash, fevers, chills, night sweats, weight loss, swollen lymph nodes, body aches, joint swelling, chest pain, shortness of breath, mood changes. POSITIVE muscle aches  Objective  There were no vitals taken for this visit.   General: No apparent distress alert and oriented x3 mood and affect normal, dressed appropriately.  HEENT: Pupils equal, extraocular movements intact  Respiratory: Patient's speak in full sentences and does not appear short of breath  Cardiovascular: No lower extremity edema, non tender, no erythema      Impression and Recommendations:

## 2024-03-01 ENCOUNTER — Encounter: Payer: Self-pay | Admitting: Family Medicine

## 2024-03-01 ENCOUNTER — Other Ambulatory Visit: Payer: Self-pay | Admitting: Family Medicine

## 2024-03-01 ENCOUNTER — Ambulatory Visit: Admitting: Family Medicine

## 2024-03-01 VITALS — BP 126/66 | HR 67 | Ht 66.0 in | Wt 200.0 lb

## 2024-03-01 DIAGNOSIS — M1711 Unilateral primary osteoarthritis, right knee: Secondary | ICD-10-CM

## 2024-03-01 MED ORDER — HYALURONAN 88 MG/4ML IX SOSY
88.0000 mg | PREFILLED_SYRINGE | Freq: Once | INTRA_ARTICULAR | Status: AC
  Administered 2024-03-01: 88 mg via INTRA_ARTICULAR

## 2024-03-01 NOTE — Patient Instructions (Signed)
 Gel injection in R knee today Good to see you! Get June something nice See you again in 3 months

## 2024-03-01 NOTE — Assessment & Plan Note (Signed)
 Repeat viscosupplementation and hopeful that this will make some improvement.  Patient has had embolization of this knee done already as well.  Patient wants to avoid surgical intervention if possible.  Increase activity slowly.  Follow-up with me again in 6 to 8 weeks.

## 2024-03-06 ENCOUNTER — Encounter: Payer: Self-pay | Admitting: Physical Therapy

## 2024-03-06 ENCOUNTER — Other Ambulatory Visit: Payer: Self-pay

## 2024-03-06 ENCOUNTER — Ambulatory Visit: Admitting: Physical Therapy

## 2024-03-06 DIAGNOSIS — M5459 Other low back pain: Secondary | ICD-10-CM | POA: Diagnosis not present

## 2024-03-06 DIAGNOSIS — M6281 Muscle weakness (generalized): Secondary | ICD-10-CM | POA: Diagnosis not present

## 2024-03-06 NOTE — Therapy (Signed)
 OUTPATIENT PHYSICAL THERAPY TREATMENT   Patient Name: Joshua [name] MRN: [id number] DOB:[birth year], 83 y.o., male Today's Date: 03/06/2024   END OF SESSION:  PT End of Session - 03/06/24 0929     Visit Number 8    Number of Visits 14    Date for Recertification  04/17/24    Authorization Type [employer]    Progress Note Due on Visit 10    PT Start Time 0930    PT Stop Time 1010    PT Time Calculation (min) 40 min    Activity Tolerance Patient tolerated treatment well    Behavior During Therapy [facility] for tasks assessed/performed                 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   Blood transfusion without reported diagnosis 11/1999   CAD (coronary artery disease)    CABG 2001   Depression    Diverticulosis    DJD (degenerative joint disease)    BACK   GERD (gastroesophageal reflux disease)    H/O inguinal hernia repair 12/2018   History of gout    Hyperlipidemia    Hypertension    IBS (irritable bowel syndrome)    Obesity    OSA (obstructive sleep apnea)    USES CPAP    Pancreatitis ~ 1980   Peripheral vascular disease    Pre-diabetes    Prostate cancer (HCC) 04/2010   Sleep apnea    Past Surgical History:  Procedure Laterality Date   ANTERIOR CERVICAL DECOMP/DISCECTOMY FUSION  05/26/2006   PLATE PLACED   BACK SURGERY     CARDIAC CATHETERIZATION  11/20/1999   CORONARY ARTERY BYPASS GRAFT  11/20/1999   SVG-RI1-RI2, SVG-OM, SVG-dRCA   CORONARY STENT INTERVENTION N/A 04/13/2019   Procedure: CORONARY STENT INTERVENTION;  Surgeon: Verlin Lonni BIRCH, MD;  Location: [facility];  Service: Cardiovascular;  Laterality: N/A;   HERNIA REPAIR     INGUINAL HERNIA REPAIR Left 01/02/2019   Procedure: OPEN LEFT  INGUINAL HERNIA REPAIR WITH MESH;  Surgeon: Gladis Cough, MD;  Location: [facility];  Service: General;  Laterality: Left;   IR EMBO ARTERIAL NOT HEMORR HEMANG INC GUIDE ROADMAPPING  03/08/2023   IR EMBO ARTERIAL NOT HEMORR HEMANG INC GUIDE ROADMAPPING  01/14/2024   IR RADIOLOGIST EVAL & MGMT  02/05/2023   IR RADIOLOGIST EVAL & MGMT  04/05/2023   IR RADIOLOGIST EVAL & MGMT  04/09/2023   IR RADIOLOGIST EVAL & MGMT  04/30/2023   IR RADIOLOGIST EVAL & MGMT  12/13/2023   IR RADIOLOGIST EVAL & MGMT  02/23/2024   JOINT REPLACEMENT  Left knee   KNEE ARTHROSCOPY Right 1992; 04/2008   LAPAROSCOPIC CHOLECYSTECTOMY  2007   LEFT HEART CATH AND CORS/GRAFTS ANGIOGRAPHY N/A 04/13/2019   Procedure: LEFT HEART CATH AND CORS/GRAFTS ANGIOGRAPHY;  Surgeon: Verlin Lonni BIRCH, MD;  Location: [facility];  Service: Cardiovascular;  Laterality: N/A;   NASAL SEPTUM SURGERY Right 2007   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   ROBOT ASSISTED LAPAROSCOPIC RADICAL PROSTATECTOMY  04/2010   SPINE SURGERY     THYROIDECTOMY, PARTIAL  1980   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   Procedure: LEFT TOTAL KNEE ARTHROPLASTY;  Surgeon: Sheril Coy, MD;  Location: [facility];  Service: Orthopedics;  Laterality: Left;   Patient Active Problem List   Diagnosis Date Noted   Scrotal pain 02/11/2023   De Quervain's tenosynovitis, right 10/28/2022   Calcium  oxalate crystals in urine 07/30/2022   Balanitis 07/06/2022   Urinary frequency 07/06/2022   Degenerative arthritis of right knee 06/08/2022   Left hamstring injury 04/21/2022   Degenerative cervical disc 01/12/2022   Greater trochanteric bursitis of right hip 02/17/2021   Hyperactive bowel sounds 01/29/2021   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 09/25/2020   Primary osteoarthritis of left knee 06/18/2020   Trigger thumb, left thumb 01/24/2020   Overactive bladder 11/24/2019   Left lateral epicondylitis 09/05/2019   Greater trochanteric  bursitis of left hip 10/27/2018   Degenerative disc disease, lumbar 10/27/2018   Abdominal muscle strain, initial encounter 06/16/2018   Aortic atherosclerosis 05/05/2018   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   Hyperglycemia 10/07/2017   Weakness of left leg 06/21/2017   DJD (degenerative joint disease)    Arthritis    Neck pain 02/08/2017   Venous insufficiency 12/29/2016   Ganglion cyst of tendon sheath of right hand 12/29/2016   Gout 11/20/2016   Obesity, Class I, BMI 30-34.9 10/03/2015   Hereditary and idiopathic peripheral neuropathy 09/25/2014   Allergic rhinitis 05/31/2014   Fatigue 11/09/2013   CKD (chronic kidney disease), stage III (HCC) 10/23/2013   Dizziness 08/23/2013   Neuropathy (HCC) 08/21/2013   Chronic bilateral low back pain with left-sided sciatica 10/09/2011   History of bladder cancer 04/01/2010   Localized osteoarthrosis, lower leg 10/04/2009   Generalized abdominal pain 01/31/2009   Tinnitus 05/05/2007   Essential hypertension 02/21/2007   OSA (obstructive sleep apnea) 11/02/2006   Hyperlipidemia 10/12/2006   Coronary atherosclerosis 10/12/2006   Asthma 10/12/2006   GERD 10/12/2006    PCP: Katrinka Garnette KIDD, MD  REFERRING PROVIDER: Joane Artist RAMAN, MD  REFERRING DIAG: Chronic bilateral low back pain without sciatica  Rationale for Evaluation and Treatment: Rehabilitation  THERAPY DIAG:  Other low back pain  Muscle weakness (generalized)  ONSET DATE: Chronic   SUBJECTIVE SUBJECTIVE STATEMENT: Patient reports he is having more knee and right hip and lower back pain this visit.   Eval: Patient reports he has had back pain for a long time with no mechanism of injury. He tried pool therapy at least 3 years ago and he didn't like that. Today his back pain is good, most days are ok, and then occasionally he has severe pain. He thinks lifting heavy objects or combine squatting and bending to clean the litter box causes his pain to get worse. He reports  difficulty getting out of bed in the morning. Pain is located to the lower back, across the back and worse on the left side. He has had epidurals in his back that worked for a while and then stopped working. He does report painful peripheral neuropathy in his feet that seems to affect his balance.   PERTINENT HISTORY:  See PMH above  PAIN:  Are you having pain? Yes:  NPRS scale: 5/10 currently, 7/10 at worst Pain location: Lower back Pain description: pushing type of pain Aggravating factors: Lifting, squatting and bending Relieving factors: Massage, heat, Tylenol   PRECAUTIONS: None  PATIENT GOALS: Improve pain   OBJECTIVE:  Note: Objective measures were completed at Evaluation unless otherwise noted. PATIENT SURVEYS:  Modified Oswestry: 24% disability (12/50)  02/21/2024: 20% (10/50)  MUSCLE LENGTH: Limitation  POSTURE:  Decreased lumbar lordosis, rounded shoulder posture, forward trunk lean  PALPATION: Tender to palpation across lumbar region, L > R  LUMBAR ROM:   AROM eval 02/21/2024  Flexion 25% 50%  Extension 50% 50%  Right lateral flexion 25% 50%  Left lateral flexion 25% 50%  Right rotation 50% 50%  Left rotation 25% 50%   (Blank rows = not tested)  LOWER EXTREMITY ROM:      Hip PROM grossly [facility]  LOWER EXTREMITY MMT:    MMT Right eval Left eval Rt / Lt 02/21/2024  Hip flexion 4 4-   Hip extension 3- 3- 3 / 3  Hip abduction 3- 3- 3 / 3  Hip adduction     Hip internal rotation     Hip external rotation     Knee flexion 4+ 4+   Knee extension 4+ 4   Ankle dorsiflexion     Ankle plantarflexion     Ankle inversion     Ankle eversion      (Blank rows = not tested)  LUMBAR SPECIAL TESTS:  Lumbar radicular testing negative  FUNCTIONAL TESTS:  5 times sit to stand: 18 seconds  02/21/2024: 15 seconds  GAIT: Assistive device utilized: None Level of assistance: Complete Independence Comments: Forward trunk lean, wide BOS   TREATMENT [facility]  Adult PT Treatment:                                                DATE: 03/06/2024 Prone IASTM using theragun for lumbar and thoracic paraspinals Prone lumbar and thoracic PA mobs Seated stability ball roll out 5 x 5 sec fwd and diagonal Seated hamstring stretch 3 x 20 sec each Seated clamshell with red 2 x 10 Seated thoracic rotation with stability ball between knees and hands behind head, incorporating deep breathing to gain increased range of motion x 5 each Seated thoracic extension over towel roll back of chair with hands behind head x 10 Seated horizontal abduction with yellow 2 x 10  PATIENT EDUCATION:  Education details: HEP Person educated: Patient Education method: Programmer, Multimedia, Demonstration, Actor cues, Verbal cues Education comprehension: verbalized understanding, returned demonstration, verbal cues required, tactile cues required, and needs further education  HOME EXERCISE PROGRAM: Access Code: [email or web address]    ASSESSMENT: CLINICAL IMPRESSION: Patient tolerated therapy well with no adverse effects. Therapy continued to focus on improving his spinal mobility and postural control with good tolerance. He was able to tolerate banded exercises for light hip strengthening and postural control. He reports his back pain is mainly around the thoracic spine region. No changes made to HEP this visit. Patient would benefit from continued skilled PT to progress mobility and strength in order to reduce pain and maximize functional ability.   Eval: Patient is a 83 y.o. male who was seen today for physical therapy evaluation and treatment for chronic lower back pain. He exhibits limitations in his lumbar mobility, hip and hamstring flexibility, and gross strength deficits with postural deviations that are contributing to his pain and impacting his functional ability.   OBJECTIVE IMPAIRMENTS: Abnormal gait, decreased activity tolerance, decreased balance, decreased ROM, decreased strength,  impaired flexibility, postural dysfunction, and pain.   ACTIVITY LIMITATIONS: carrying, lifting, bending, standing, squatting, sleeping, and locomotion level  PARTICIPATION LIMITATIONS: cleaning and community activity  PERSONAL FACTORS: Fitness, Past/current experiences, and Time since onset of injury/illness/exacerbation are  also affecting patient's functional outcome.    GOALS: Goals reviewed with patient? No  SHORT TERM GOALS: Target date: 03/20/2024  Patient will be I with initial HEP in order to progress with therapy. Baseline: HEP provided at eval 02/09/2024: independent Goal status: MET  2.  Patient will report low back pain </= 4/10 at worst in order to reduce functional limitations Baseline: 7/10 02/09/2024: 7/10 02/21/2024: 7/10 Goal status: ONGOING  LONG TERM GOALS: Target date: 04/17/2024  Patient will be I with final HEP to maintain progress from PT. Baseline: HEP provided at eval 02/21/2024: progressing Goal status: ONGOING  2.  Patient will report Modified Oswestry </= 6/50 (12% disability) in order to indicate an improvement in their functional status Baseline: 24% 02/21/2024: 20% Goal status: ONGOING  3.  Patient will demonstrate a 25% improvement in his lumbar motion in order to improve his ability to bend and clean the litter box at home Baseline: see limitations above 02/21/2024: see limitations above Goal status: ONGOING  4.  Patient will exhibit hip strength >/= 4-/5 MMT in order to improve standing, walking, and lifting tolerance Baseline: grossly 3-/5 MMT 02/21/2024: grossly 3/5 MMT Goal status: ONGOING  5. Patient will demonstrate 5xSTS </= 12 seconds in order to indicate improved LE strength  Baseline: 18 seconds 02/21/2024: 15 seconds  Goal status: ONGOING   PLAN: PT FREQUENCY: 1x/week  PT DURATION: 8 weeks  PLANNED INTERVENTIONS: 97164- PT Re-evaluation, 97750- Physical Performance Testing, 97110-Therapeutic exercises, 97530- Therapeutic  activity, 97112- Neuromuscular re-education, 97535- Self Care, 02859- Manual therapy, 20560 (1-2 muscles), 20561 (3+ muscles)- Dry Needling, Patient/Family education, Joint mobilization, Joint manipulation, Spinal manipulation, Spinal mobilization, Cryotherapy, and Moist heat.  PLAN FOR NEXT SESSION: Review HEP and progress PRN, manual/stretching for lumbar spine, hips, and hamstring, progress core stabilization and hip/LE strengthening as tolerated; further assessment of balance   Elaine Daring, PT, DPT, LAT, ATC 03/06/2024  10:29 AM Phone: [phone] Fax: [phone]

## 2024-03-20 ENCOUNTER — Ambulatory Visit: Admitting: Physician Assistant

## 2024-03-20 ENCOUNTER — Telehealth: Payer: Self-pay | Admitting: Family Medicine

## 2024-03-20 NOTE — Telephone Encounter (Signed)
 Contacted [employer] rep to look into return.

## 2024-03-20 NOTE — Telephone Encounter (Signed)
 Patient called about his brace. He stated that back in October he spoke with [place] who provided him a phone number for the brace company and they told him he did not owe anything for the brace as he did not like the brace or want to use it. He recently received a bill for the brace and he called the number. They told him he needed to return the brace where he received it so that we could fax some paperwork to the company. Patient is a bit confused on what he is supposed to do. Please advise.

## 2024-03-24 ENCOUNTER — Ambulatory Visit: Admitting: Family Medicine

## 2024-03-24 ENCOUNTER — Encounter: Payer: Self-pay | Admitting: Family Medicine

## 2024-03-24 ENCOUNTER — Ambulatory Visit: Payer: Self-pay | Admitting: Family Medicine

## 2024-03-24 VITALS — BP 112/68 | HR 64 | Temp 98.3°F | Ht 66.0 in | Wt 199.2 lb

## 2024-03-24 DIAGNOSIS — R739 Hyperglycemia, unspecified: Secondary | ICD-10-CM | POA: Diagnosis not present

## 2024-03-24 DIAGNOSIS — E785 Hyperlipidemia, unspecified: Secondary | ICD-10-CM | POA: Diagnosis not present

## 2024-03-24 DIAGNOSIS — Z131 Encounter for screening for diabetes mellitus: Secondary | ICD-10-CM | POA: Diagnosis not present

## 2024-03-24 DIAGNOSIS — E538 Deficiency of other specified B group vitamins: Secondary | ICD-10-CM | POA: Diagnosis not present

## 2024-03-24 DIAGNOSIS — N183 Chronic kidney disease, stage 3 unspecified: Secondary | ICD-10-CM

## 2024-03-24 DIAGNOSIS — I10 Essential (primary) hypertension: Secondary | ICD-10-CM | POA: Diagnosis not present

## 2024-03-24 LAB — COMPREHENSIVE METABOLIC PANEL WITH GFR
ALT: 14 U/L (ref 3–53)
AST: 15 U/L (ref 5–37)
Albumin: 4.1 g/dL (ref 3.5–5.2)
Alkaline Phosphatase: 60 U/L (ref 39–117)
BUN: 23 mg/dL (ref 6–23)
CO2: 31 meq/L (ref 19–32)
Calcium: 9.8 mg/dL (ref 8.4–10.5)
Chloride: 102 meq/L (ref 96–112)
Creatinine, Ser: 1.35 mg/dL (ref 0.40–1.50)
GFR: 49.38 mL/min — ABNORMAL LOW
Glucose, Bld: 75 mg/dL (ref 70–99)
Potassium: 4.1 meq/L (ref 3.5–5.1)
Sodium: 140 meq/L (ref 135–145)
Total Bilirubin: 0.7 mg/dL (ref 0.2–1.2)
Total Protein: 6.8 g/dL (ref 6.0–8.3)

## 2024-03-24 LAB — CBC WITH DIFFERENTIAL/PLATELET
Basophils Absolute: 0 K/uL (ref 0.0–0.1)
Basophils Relative: 0.5 % (ref 0.0–3.0)
Eosinophils Absolute: 0.2 K/uL (ref 0.0–0.7)
Eosinophils Relative: 3.5 % (ref 0.0–5.0)
HCT: 42.7 % (ref 39.0–52.0)
Hemoglobin: 14.7 g/dL (ref 13.0–17.0)
Lymphocytes Relative: 15.9 % (ref 12.0–46.0)
Lymphs Abs: 1.1 K/uL (ref 0.7–4.0)
MCHC: 34.4 g/dL (ref 30.0–36.0)
MCV: 90.6 fl (ref 78.0–100.0)
Monocytes Absolute: 0.7 K/uL (ref 0.1–1.0)
Monocytes Relative: 9.9 % (ref 3.0–12.0)
Neutro Abs: 4.6 K/uL (ref 1.4–7.7)
Neutrophils Relative %: 70.2 % (ref 43.0–77.0)
Platelets: 216 K/uL (ref 150.0–400.0)
RBC: 4.72 Mil/uL (ref 4.22–5.81)
RDW: 12.7 % (ref 11.5–15.5)
WBC: 6.6 K/uL (ref 4.0–10.5)

## 2024-03-24 LAB — MICROALBUMIN / CREATININE URINE RATIO
Creatinine,U: 126.9 mg/dL
Microalb Creat Ratio: 11.9 mg/g (ref 0.0–30.0)
Microalb, Ur: 1.5 mg/dL (ref 0.7–1.9)

## 2024-03-24 LAB — VITAMIN B12: Vitamin B-12: 591 pg/mL (ref 211–911)

## 2024-03-24 LAB — HEMOGLOBIN A1C: Hgb A1c MFr Bld: 5.7 % (ref 4.6–6.5)

## 2024-03-24 NOTE — Patient Instructions (Addendum)
 Please stop by lab before you go If you have mychart- we will send your results within 3 business days of us  receiving them.  If you do not have mychart- we will call you about results within 5 business days of us  receiving them.  *please also note that you will see labs on mychart as soon as they post. I will later go in and write notes on them- will say notes from Dr. Katrinka   Recommended follow up: Return in about 2 months (around 05/22/2024) for followup or sooner if needed.Schedule b4 you leave.

## 2024-03-24 NOTE — Progress Notes (Signed)
 " Phone [phone] In person visit   Subjective:   Joshua [name] is a 84 y.o. year old very pleasant male patient who presents for/with See problem oriented charting Chief Complaint  Patient presents with   Medical Management of Chronic Issues    Follow up; pt states he is having trouble with his stomach; right knee pain, had gel injection 1 month ago;     Past Medical History-  Patient Active Problem List   Diagnosis Date Noted   Scrotal pain 02/11/2023    Priority: High   Coronary atherosclerosis 10/12/2006    Priority: High   Overactive bladder 11/24/2019    Priority: Medium    Hyperglycemia 10/07/2017    Priority: Medium    Gout 11/20/2016    Priority: Medium    CKD (chronic kidney disease), stage III (HCC) 10/23/2013    Priority: Medium    Neuropathy (HCC) 08/21/2013    Priority: Medium    History of bladder cancer 04/01/2010    Priority: Medium    Essential hypertension 02/21/2007    Priority: Medium    Hyperlipidemia 10/12/2006    Priority: Medium    Asthma 10/12/2006    Priority: Medium    Aortic atherosclerosis 05/05/2018    Priority: Low   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    Priority: Low   DJD (degenerative joint disease)     Priority: Low   Arthritis     Priority: Low   Neck pain 02/08/2017    Priority: Low   Venous insufficiency 12/29/2016    Priority: Low   Ganglion cyst of tendon sheath of right hand 12/29/2016    Priority: Low   Obesity, Class I, BMI 30-34.9 10/03/2015    Priority: Low   Allergic rhinitis 05/31/2014    Priority: Low   Fatigue 11/09/2013    Priority: Low   Dizziness 08/23/2013    Priority: Low   Chronic bilateral low back pain with left-sided sciatica 10/09/2011    Priority: Low   Localized osteoarthrosis, lower leg 10/04/2009    Priority: Low   Tinnitus 05/05/2007    Priority: Low   OSA (obstructive sleep apnea) 11/02/2006    Priority: Low   GERD 10/12/2006    Priority: Low   De Quervain's tenosynovitis, right  10/28/2022    Priority: 1.   Degenerative arthritis of right knee 06/08/2022    Priority: 1.   Left hamstring injury 04/21/2022    Priority: 1.   Degenerative cervical disc 01/12/2022    Priority: 1.   Greater trochanteric bursitis of right hip 02/17/2021    Priority: 1.   Primary osteoarthritis of left knee 06/18/2020    Priority: 1.   Trigger thumb, left thumb 01/24/2020    Priority: 1.   Greater trochanteric bursitis of left hip 10/27/2018    Priority: 1.   Degenerative disc disease, lumbar 10/27/2018    Priority: 1.   Abdominal muscle strain, initial encounter 06/16/2018    Priority: 1.   Weakness of left leg 06/21/2017    Priority: 1.   Calcium  oxalate crystals in urine 07/30/2022   Balanitis 07/06/2022   Urinary frequency 07/06/2022   Hyperactive bowel sounds 01/29/2021   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 09/25/2020   Left lateral epicondylitis 09/05/2019   Hereditary and idiopathic peripheral neuropathy 09/25/2014   Generalized abdominal pain 01/31/2009    Medications- reviewed and updated Current Outpatient Medications  Medication Sig Dispense Refill   acetaminophen  (TYLENOL ) 500 MG tablet Take  1,000 mg by mouth every 6 (six) hours as needed for moderate pain or headache.     albuterol  (PROAIR  HFA) 108 (90 Base) MCG/ACT inhaler Inhale 1-2 puffs into the lungs every 6 (six) hours as needed for wheezing or shortness of breath. 18 g 5   Alirocumab  (PRALUENT ) 150 MG/ML SOAJ INJECT CONTENTS OF 1 PEN (150MG ) INTO THE SKIN EVERY 14 DAYS 2 mL 11   amLODipine  (NORVASC ) 5 MG tablet TAKE 1 TABLET (5 MG TOTAL) BY MOUTH DAILY. 90 tablet 3   ARNUITY ELLIPTA  200 MCG/ACT AEPB 1 puff Inhalation Once a day     aspirin  EC 81 MG tablet Take 1 tablet (81 mg total) by mouth 2 (two) times daily after a meal. 30 tablet 11   azelastine (ASTELIN) 0.1 % nasal spray 1 spray in each nostril Nasally Twice a day; Duration: 30 days     cholecalciferol (VITAMIN D3) 25  MCG (1000 UNIT) tablet Take 1,000 Units by mouth daily.     diazepam  (VALIUM ) 5 MG tablet TAKE 1 TABLET (5 MG TOTAL) BY MOUTH AT BEDTIME AS NEEDED (SLEEP). 30 tablet 0   dicyclomine  (BENTYL ) 10 MG capsule Take 1 capsule (10 mg total) by mouth 2 (two) times daily as needed for spasms (abdominal cramping). 60 capsule 3   EPINEPHrine  0.3 mg/0.3 mL IJ SOAJ injection Inject 0.3 mg into the muscle as needed for anaphylaxis. for allergic reaction     ezetimibe  (ZETIA ) 10 MG tablet TAKE 1 TABLET BY MOUTH EVERY DAY 90 tablet 2   folic acid  (FOLVITE ) 800 MCG tablet Take 800 mcg by mouth daily.      gabapentin  (NEURONTIN ) 300 MG capsule Take 2 cap in AM, 1 cap at noon, 3 caps at bedtime 540 capsule 3   ketoconazole (NIZORAL) 2 % cream Apply 1 Application topically 2 (two) times daily as needed.     Loratadine  (CLARITIN  PO) Take by mouth at bedtime.     MYRBETRIQ  50 MG TB24 tablet TAKE 1 TABLET BY MOUTH EVERY DAY (REPLACES GEMTESA ) 90 tablet 1   nitroGLYCERIN  (NITROSTAT ) 0.4 MG SL tablet PLACE 1 TABLET UNDER THE TONGUE EVERY 5 MINUTES AS NEEDED FOR CHEST PAIN. 25 tablet 5   pantoprazole  (PROTONIX ) 40 MG tablet TAKE 1 TABLET BY MOUTH EVERY DAY 90 tablet 3   polyethylene glycol powder (GLYCOLAX /MIRALAX ) powder Take 17 g by mouth daily. 255 g 0   valsartan  (DIOVAN ) 320 MG tablet TAKE 1 TABLET BY MOUTH EVERY DAY 90 tablet 3   Cyanocobalamin  (B-12) 2500 MCG TABS Take 1,250 mcg by mouth daily. (Patient not taking: Reported on 03/24/2024)     No current facility-administered medications for this visit.     Objective:  BP 112/68 (BP Location: Left Arm, Patient Position: Sitting, Cuff Size: Normal)   Pulse 64   Temp 98.3 F (36.8 C) (Temporal)   Ht 5' 6 (1.676 m)   Wt 199 lb 3.2 oz (90.4 kg)   SpO2 95%   BMI 32.15 kg/m  Gen: NAD, resting comfortably CV: RRR no murmurs rubs or gallops- occasional ectopic beats Lungs: CTAB no crackles, wheeze, rhonchi Ext: no edema Skin: warm, dry     Assessment and  Plan   # Right knee pain from osteoarthritis S: Had injection about a month ago-reports helped for about a month but now worsening  A/P: next visit will be 3 months out. May not be worth it for another injection but he is thankful for at least short term relief.     #  IBS- since age 37  -Has Bentyl  on hand -ongoing issues with this - not bad enough that he wants to return to gastroenterology at this time -difficulty with constipation lately- miralax , metamucil and stool softener this morning- when this finally works had many bowel movements and get some irritation with cleaning  #CAD/hyperlipidemia-history of CABG x5- follows with Dr. Lavona S: Meds: asa 81mg , praluent  -on Zetia  10 mg p.o. as well but also on praluent  150 mg q2 weeks -Stent drug eluting placed 04/13/19 Dr. Verlin -no chest pain or shortness of breath  Lab Results  Component Value Date   CHOL 117 08/12/2023   HDL 45 08/12/2023   LDLCALC 45 08/12/2023   LDLDIRECT 115.0 06/04/2016   TRIG 161 (H) 08/12/2023   CHOLHDL 2.6 08/12/2023  A/P: coronary artery disease asymptomatic continue current medications  Lipids at goal on praluent  and zetia     #Hypertension/CKD stage III S: Compliant with amlodipine  5 mg, hydrochlorothiazide  25 mg (trial of half tablet April 2025- still on half ) and valsartan  320 mg--> 160 mg.   -GFR has been stable.  On ARB in case proteinuric element BP Readings from Last 3 Encounters:  03/24/24 112/68  03/01/24 126/66  01/26/24 138/80  A/P: blood pressure is well controlled - hed like to get rid of the hydrochlorothiazide  with ongoing urinary issues- we will trial off and he will increase valsartan  back to 320 mg and update me in 2 weeks wit home blood pressure  . Check potassium today as well as likely next visit  CKD III- hoping stable- was 57 last visit- also check uacr as well   #Depression, major, single episod S:  Medication:Patient has not tolerated medication in the past-feels too  tired and sleepy on SSRIs.  Later had dizziness on Lexapro  -Has declined counseling and continues to decline counseling-has done this in the past and only somewhat helpful but had to find the right fit    03/24/2024    9:23 AM 12/31/2023   10:32 AM 08/25/2023    9:21 AM  Depression screen PHQ 2/9  Decreased Interest 0 0 0  Down, Depressed, Hopeless 1 1 1   PHQ - 2 Score 1 1 1   Altered sleeping 1 1 0  Tired, decreased energy 1 2   Change in appetite 0 0 1  Feeling bad or failure about yourself  0 0 1  Trouble concentrating 0 0 2  Moving slowly or fidgety/restless 0 0 0  Suicidal thoughts 0 0 0  PHQ-9 Score 3 4  5    Difficult doing work/chores Somewhat difficult Not difficult at all Not difficult at all     Data saved with a previous flowsheet row definition  A/P: reasonable control with phq9 under 5 . In general doing ok- more aware of his mortaliity  % Neuropathy-follows with Dr. Georjean S: Compliant with gabapentin  300 mg-2, 1, 2 (prior side effects on Lyrica) .  Burning/needle pain in the feet  A/P: slightly better lately- continue to monitor and continue current medications    #Hyperglycemia history of mildly elevated A1c.  Prediabetes range. Update a1c today Lab Results  Component Value Date   HGBA1C 5.8 05/25/2023   HGBA1C 5.9 08/19/2022   HGBA1C 6.1 03/25/2022   #b12 deficiency-on oral B12 in past- now off and we will make sure still ok Lab Results  Component Value Date   VITAMINB12 780 05/25/2023   #OAB- mybetriq some help. Plus trying to get off hydrochlorothiazide  to see if that helps  #  Asthma--Arnuity in late 2025 - doing much better on this- much less cough  -flovent  helpful in past  - Also with allergies on Flonase   -Stopped allergy  shots about 1.5 years ago  #Gout- no issues lately  Recommended follow up: Return in about 2 months (around 05/22/2024) for followup or sooner if needed.Schedule b4 you leave. Future Appointments  Date Time Provider Department  Center  05/22/2024 12:20 PM Georjean Darice HERO, MD [facility] None  05/30/2024  9:30 AM Claudene Arthea HERO, DO [facility] None  06/15/2024 10:00 AM [facility] ANNUAL WELLNESS VISIT 1 [facility]    Lab/Order associations:   ICD-10-CM   1. Hyperlipidemia, unspecified hyperlipidemia type  E78.5 CBC w/Diff    Comprehensive metabolic panel with GFR    2. Hyperglycemia  R73.9 Hemoglobin A1c    3. Screening for diabetes mellitus  Z13.1 Hemoglobin A1c    4. Essential hypertension  I10 CBC w/Diff    Comprehensive metabolic panel with GFR    Microalbumin / creatinine urine ratio    5. B12 deficiency  E53.8 Vitamin B12    6. Stage 3 chronic kidney disease, unspecified whether stage 3a or 3b CKD (HCC)  N18.30       No orders of the defined types were placed in this encounter.   Return precautions advised.  Garnette Lukes, MD  "

## 2024-03-31 LAB — GENECONNECT MOLECULAR SCREEN: Genetic Analysis Overall Interpretation: NEGATIVE

## 2024-04-03 NOTE — Telephone Encounter (Signed)
 Patient aware

## 2024-04-10 ENCOUNTER — Encounter: Payer: Self-pay | Admitting: Family Medicine

## 2024-04-11 NOTE — Progress Notes (Unsigned)
"       ° °  LILLETTE Ileana Collet, PhD, LAT, ATC acting as a scribe for Artist Lloyd, MD.  Joshua [name] is a 84 y.o. male who presents to [facility] today for R arm pain. Pt was previously seen by Dr. Claudene on 03/01/24 for R knee OA.  Today, pt c/o R arm pain x ***. Pt was doing some heavy lifting ***? Pt locates pain to ***   Pertinent review of systems: ***  Relevant historical information: ***   Exam:  There were no vitals taken for this visit. General: Well Developed, well nourished, and in no acute distress.   MSK: ***    Lab and Radiology Results No results found. However, due to the size of the patient record, not all encounters were searched. Please check Results Review for a complete set of results. No results found.     Assessment and Plan: 84 y.o. male with ***   PDMP not reviewed this encounter. No orders of the defined types were placed in this encounter.  No orders of the defined types were placed in this encounter.    Discussed warning signs or symptoms. Please see discharge instructions. Patient expresses understanding.   ***  "

## 2024-04-12 ENCOUNTER — Other Ambulatory Visit: Payer: Self-pay

## 2024-04-12 ENCOUNTER — Encounter: Payer: Self-pay | Admitting: Family Medicine

## 2024-04-12 ENCOUNTER — Ambulatory Visit: Admitting: Family Medicine

## 2024-04-12 ENCOUNTER — Ambulatory Visit

## 2024-04-12 VITALS — BP 130/64 | HR 56 | Ht 66.0 in | Wt 202.0 lb

## 2024-04-12 DIAGNOSIS — M25511 Pain in right shoulder: Secondary | ICD-10-CM | POA: Diagnosis not present

## 2024-04-12 DIAGNOSIS — G8929 Other chronic pain: Secondary | ICD-10-CM

## 2024-04-12 NOTE — Patient Instructions (Addendum)
 Thank you for coming in today.  You received an injection today. Seek immediate medical attention if the joint becomes red, extremely painful, or is oozing fluid.  Please get an Xray today before you leave

## 2024-04-18 ENCOUNTER — Ambulatory Visit: Payer: Self-pay | Admitting: Family Medicine

## 2024-04-18 NOTE — Progress Notes (Signed)
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

## 2024-04-19 ENCOUNTER — Other Ambulatory Visit: Payer: Self-pay | Admitting: Neurology

## 2024-04-19 ENCOUNTER — Other Ambulatory Visit: Payer: Self-pay | Admitting: Physician Assistant

## 2024-04-19 ENCOUNTER — Other Ambulatory Visit: Payer: Self-pay | Admitting: Family Medicine

## 2024-04-24 ENCOUNTER — Telehealth: Payer: Self-pay | Admitting: Cardiology

## 2024-04-27 ENCOUNTER — Ambulatory Visit: Payer: PPO | Admitting: Neurology

## 2024-05-22 ENCOUNTER — Ambulatory Visit: Admitting: Neurology

## 2024-05-23 ENCOUNTER — Ambulatory Visit: Admitting: Family Medicine

## 2024-05-30 ENCOUNTER — Ambulatory Visit: Admitting: Family Medicine

## 2024-06-15 ENCOUNTER — Ambulatory Visit
